# Patient Record
Sex: Female | Born: 1955
Health system: Southern US, Community
[De-identification: ages and names within clinical notes are randomized; demographics above are authoritative.]

## PROBLEM LIST (undated history)

## (undated) DIAGNOSIS — K449 Diaphragmatic hernia without obstruction or gangrene: Secondary | ICD-10-CM

## (undated) DIAGNOSIS — J309 Allergic rhinitis, unspecified: Secondary | ICD-10-CM

## (undated) DIAGNOSIS — K222 Esophageal obstruction: Secondary | ICD-10-CM

## (undated) DIAGNOSIS — IMO0002 Reserved for concepts with insufficient information to code with codable children: Secondary | ICD-10-CM

## (undated) DIAGNOSIS — K2270 Barrett's esophagus without dysplasia: Secondary | ICD-10-CM

## (undated) DIAGNOSIS — J189 Pneumonia, unspecified organism: Secondary | ICD-10-CM

## (undated) DIAGNOSIS — E785 Hyperlipidemia, unspecified: Secondary | ICD-10-CM

## (undated) DIAGNOSIS — B009 Herpesviral infection, unspecified: Secondary | ICD-10-CM

## (undated) DIAGNOSIS — I219 Acute myocardial infarction, unspecified: Secondary | ICD-10-CM

## (undated) DIAGNOSIS — Z8719 Personal history of other diseases of the digestive system: Secondary | ICD-10-CM

## (undated) DIAGNOSIS — J449 Chronic obstructive pulmonary disease, unspecified: Secondary | ICD-10-CM

## (undated) DIAGNOSIS — D649 Anemia, unspecified: Secondary | ICD-10-CM

## (undated) DIAGNOSIS — F329 Major depressive disorder, single episode, unspecified: Secondary | ICD-10-CM

## (undated) DIAGNOSIS — R079 Chest pain, unspecified: Secondary | ICD-10-CM

## (undated) DIAGNOSIS — K2980 Duodenitis without bleeding: Secondary | ICD-10-CM

## (undated) DIAGNOSIS — Z972 Presence of dental prosthetic device (complete) (partial): Secondary | ICD-10-CM

## (undated) DIAGNOSIS — F32A Depression, unspecified: Secondary | ICD-10-CM

## (undated) DIAGNOSIS — K219 Gastro-esophageal reflux disease without esophagitis: Secondary | ICD-10-CM

## (undated) DIAGNOSIS — J4489 Other specified chronic obstructive pulmonary disease: Secondary | ICD-10-CM

## (undated) DIAGNOSIS — M1990 Unspecified osteoarthritis, unspecified site: Secondary | ICD-10-CM

## (undated) DIAGNOSIS — F419 Anxiety disorder, unspecified: Secondary | ICD-10-CM

## (undated) DIAGNOSIS — J984 Other disorders of lung: Secondary | ICD-10-CM

## (undated) DIAGNOSIS — S82899A Other fracture of unspecified lower leg, initial encounter for closed fracture: Secondary | ICD-10-CM

## (undated) DIAGNOSIS — K08109 Complete loss of teeth, unspecified cause, unspecified class: Secondary | ICD-10-CM

## (undated) DIAGNOSIS — T07XXXA Unspecified multiple injuries, initial encounter: Secondary | ICD-10-CM

## (undated) DIAGNOSIS — J45909 Unspecified asthma, uncomplicated: Secondary | ICD-10-CM

## (undated) DIAGNOSIS — T7840XA Allergy, unspecified, initial encounter: Secondary | ICD-10-CM

## (undated) HISTORY — DX: Other disorders of lung: J98.4

## (undated) HISTORY — DX: Diaphragmatic hernia without obstruction or gangrene: K44.9

## (undated) HISTORY — PX: EYE SURGERY: SHX253

## (undated) HISTORY — PX: ABDOMINAL HYSTERECTOMY: SHX81

## (undated) HISTORY — DX: Duodenitis without bleeding: K29.80

## (undated) HISTORY — DX: Esophageal obstruction: K22.2

## (undated) HISTORY — DX: Unspecified multiple injuries, initial encounter: T07.XXXA

## (undated) HISTORY — DX: Allergy, unspecified, initial encounter: T78.40XA

## (undated) HISTORY — PX: APPENDECTOMY: SHX54

## (undated) HISTORY — DX: Reserved for concepts with insufficient information to code with codable children: IMO0002

## (undated) HISTORY — DX: Personal history of other diseases of the digestive system: Z87.19

## (undated) HISTORY — DX: Pneumonia, unspecified organism: J18.9

## (undated) HISTORY — PX: COLONOSCOPY: SHX174

## (undated) HISTORY — DX: Hyperlipidemia, unspecified: E78.5

## (undated) HISTORY — DX: Gastro-esophageal reflux disease without esophagitis: K21.9

## (undated) HISTORY — DX: Depression, unspecified: F32.A

## (undated) HISTORY — DX: Other specified chronic obstructive pulmonary disease: J44.89

## (undated) HISTORY — DX: Chronic obstructive pulmonary disease, unspecified: J44.9

## (undated) HISTORY — DX: Barrett's esophagus without dysplasia: K22.70

## (undated) HISTORY — PX: WRIST FRACTURE SURGERY: SHX121

## (undated) HISTORY — DX: Anxiety disorder, unspecified: F41.9

## (undated) HISTORY — DX: Chest pain, unspecified: R07.9

## (undated) HISTORY — DX: Anemia, unspecified: D64.9

## (undated) HISTORY — PX: CHOLECYSTECTOMY: SHX55

## (undated) HISTORY — DX: Unspecified osteoarthritis, unspecified site: M19.90

## (undated) HISTORY — DX: Other fracture of unspecified lower leg, initial encounter for closed fracture: S82.899A

## (undated) HISTORY — PX: NECK SURGERY: SHX720

## (undated) HISTORY — DX: Allergic rhinitis, unspecified: J30.9

## (undated) HISTORY — DX: Major depressive disorder, single episode, unspecified: F32.9

## (undated) HISTORY — PX: ELBOW FRACTURE SURGERY: SHX616

## (undated) HISTORY — PX: TONSILLECTOMY: SUR1361

---

## 1997-10-03 ENCOUNTER — Emergency Department (HOSPITAL_COMMUNITY): Admission: AD | Admit: 1997-10-03 | Discharge: 1997-10-04 | Payer: Self-pay | Admitting: Emergency Medicine

## 1998-02-17 ENCOUNTER — Ambulatory Visit (HOSPITAL_COMMUNITY): Admission: RE | Admit: 1998-02-17 | Discharge: 1998-02-17 | Payer: Self-pay | Admitting: Internal Medicine

## 1998-02-28 ENCOUNTER — Other Ambulatory Visit: Admission: RE | Admit: 1998-02-28 | Discharge: 1998-02-28 | Payer: Self-pay | Admitting: Obstetrics and Gynecology

## 1998-03-01 ENCOUNTER — Ambulatory Visit (HOSPITAL_COMMUNITY): Admission: RE | Admit: 1998-03-01 | Discharge: 1998-03-01 | Payer: Self-pay | Admitting: Obstetrics and Gynecology

## 1998-03-08 ENCOUNTER — Inpatient Hospital Stay (HOSPITAL_COMMUNITY): Admission: RE | Admit: 1998-03-08 | Discharge: 1998-03-12 | Payer: Self-pay | Admitting: Obstetrics and Gynecology

## 1998-04-19 ENCOUNTER — Emergency Department (HOSPITAL_COMMUNITY): Admission: EM | Admit: 1998-04-19 | Discharge: 1998-04-19 | Payer: Self-pay | Admitting: Internal Medicine

## 1998-11-09 ENCOUNTER — Emergency Department (HOSPITAL_COMMUNITY): Admission: EM | Admit: 1998-11-09 | Discharge: 1998-11-09 | Payer: Self-pay | Admitting: Emergency Medicine

## 1998-11-09 ENCOUNTER — Encounter: Payer: Self-pay | Admitting: Emergency Medicine

## 1999-01-03 ENCOUNTER — Emergency Department (HOSPITAL_COMMUNITY): Admission: EM | Admit: 1999-01-03 | Discharge: 1999-01-03 | Payer: Self-pay | Admitting: Emergency Medicine

## 1999-04-27 ENCOUNTER — Other Ambulatory Visit: Admission: RE | Admit: 1999-04-27 | Discharge: 1999-04-27 | Payer: Self-pay | Admitting: Obstetrics and Gynecology

## 1999-05-17 ENCOUNTER — Emergency Department (HOSPITAL_COMMUNITY): Admission: EM | Admit: 1999-05-17 | Discharge: 1999-05-17 | Payer: Self-pay | Admitting: *Deleted

## 1999-05-19 ENCOUNTER — Emergency Department (HOSPITAL_COMMUNITY): Admission: EM | Admit: 1999-05-19 | Discharge: 1999-05-19 | Payer: Self-pay | Admitting: Emergency Medicine

## 1999-08-14 ENCOUNTER — Encounter: Payer: Self-pay | Admitting: Emergency Medicine

## 1999-08-14 ENCOUNTER — Emergency Department (HOSPITAL_COMMUNITY): Admission: EM | Admit: 1999-08-14 | Discharge: 1999-08-14 | Payer: Self-pay | Admitting: Emergency Medicine

## 1999-10-10 ENCOUNTER — Emergency Department (HOSPITAL_COMMUNITY): Admission: EM | Admit: 1999-10-10 | Discharge: 1999-10-11 | Payer: Self-pay | Admitting: Emergency Medicine

## 1999-10-17 ENCOUNTER — Emergency Department (HOSPITAL_COMMUNITY): Admission: EM | Admit: 1999-10-17 | Discharge: 1999-10-17 | Payer: Self-pay | Admitting: Emergency Medicine

## 1999-11-05 ENCOUNTER — Emergency Department (HOSPITAL_COMMUNITY): Admission: EM | Admit: 1999-11-05 | Discharge: 1999-11-06 | Payer: Self-pay | Admitting: Emergency Medicine

## 1999-11-05 ENCOUNTER — Encounter: Payer: Self-pay | Admitting: Emergency Medicine

## 1999-11-19 ENCOUNTER — Encounter: Payer: Self-pay | Admitting: Orthopedic Surgery

## 1999-11-19 ENCOUNTER — Ambulatory Visit (HOSPITAL_COMMUNITY): Admission: RE | Admit: 1999-11-19 | Discharge: 1999-11-19 | Payer: Self-pay | Admitting: Orthopedic Surgery

## 2000-01-28 ENCOUNTER — Inpatient Hospital Stay (HOSPITAL_COMMUNITY): Admission: RE | Admit: 2000-01-28 | Discharge: 2000-01-29 | Payer: Self-pay | Admitting: Orthopaedic Surgery

## 2000-06-08 ENCOUNTER — Emergency Department (HOSPITAL_COMMUNITY): Admission: EM | Admit: 2000-06-08 | Discharge: 2000-06-08 | Payer: Self-pay | Admitting: *Deleted

## 2000-06-14 ENCOUNTER — Emergency Department (HOSPITAL_COMMUNITY): Admission: EM | Admit: 2000-06-14 | Discharge: 2000-06-14 | Payer: Self-pay | Admitting: Emergency Medicine

## 2000-06-14 ENCOUNTER — Encounter: Payer: Self-pay | Admitting: Emergency Medicine

## 2000-10-26 ENCOUNTER — Emergency Department (HOSPITAL_COMMUNITY): Admission: EM | Admit: 2000-10-26 | Discharge: 2000-10-26 | Payer: Self-pay | Admitting: Emergency Medicine

## 2000-10-26 ENCOUNTER — Encounter: Payer: Self-pay | Admitting: Emergency Medicine

## 2000-11-05 ENCOUNTER — Emergency Department (HOSPITAL_COMMUNITY): Admission: EM | Admit: 2000-11-05 | Discharge: 2000-11-05 | Payer: Self-pay | Admitting: Emergency Medicine

## 2000-11-05 ENCOUNTER — Encounter: Payer: Self-pay | Admitting: Emergency Medicine

## 2000-11-11 ENCOUNTER — Ambulatory Visit (HOSPITAL_COMMUNITY): Admission: RE | Admit: 2000-11-11 | Discharge: 2000-11-11 | Payer: Self-pay | Admitting: Gastroenterology

## 2001-01-06 ENCOUNTER — Encounter: Payer: Self-pay | Admitting: Gastroenterology

## 2001-01-06 ENCOUNTER — Ambulatory Visit (HOSPITAL_COMMUNITY): Admission: RE | Admit: 2001-01-06 | Discharge: 2001-01-06 | Payer: Self-pay | Admitting: Gastroenterology

## 2001-03-09 ENCOUNTER — Emergency Department (HOSPITAL_COMMUNITY): Admission: EM | Admit: 2001-03-09 | Discharge: 2001-03-09 | Payer: Self-pay | Admitting: *Deleted

## 2001-04-20 ENCOUNTER — Emergency Department (HOSPITAL_COMMUNITY): Admission: EM | Admit: 2001-04-20 | Discharge: 2001-04-20 | Payer: Self-pay | Admitting: Emergency Medicine

## 2001-04-20 ENCOUNTER — Encounter: Payer: Self-pay | Admitting: Emergency Medicine

## 2001-04-26 ENCOUNTER — Emergency Department (HOSPITAL_COMMUNITY): Admission: EM | Admit: 2001-04-26 | Discharge: 2001-04-26 | Payer: Self-pay | Admitting: Emergency Medicine

## 2001-06-24 ENCOUNTER — Encounter: Payer: Self-pay | Admitting: Emergency Medicine

## 2001-06-24 ENCOUNTER — Emergency Department (HOSPITAL_COMMUNITY): Admission: EM | Admit: 2001-06-24 | Discharge: 2001-06-24 | Payer: Self-pay | Admitting: Emergency Medicine

## 2001-07-07 ENCOUNTER — Encounter: Payer: Self-pay | Admitting: Gastroenterology

## 2001-07-07 ENCOUNTER — Ambulatory Visit (HOSPITAL_COMMUNITY): Admission: RE | Admit: 2001-07-07 | Discharge: 2001-07-07 | Payer: Self-pay | Admitting: Gastroenterology

## 2001-07-16 ENCOUNTER — Encounter: Payer: Self-pay | Admitting: Gastroenterology

## 2001-07-16 ENCOUNTER — Ambulatory Visit (HOSPITAL_COMMUNITY): Admission: RE | Admit: 2001-07-16 | Discharge: 2001-07-16 | Payer: Self-pay | Admitting: Gastroenterology

## 2001-07-28 ENCOUNTER — Encounter (INDEPENDENT_AMBULATORY_CARE_PROVIDER_SITE_OTHER): Payer: Self-pay | Admitting: Specialist

## 2001-07-28 ENCOUNTER — Ambulatory Visit (HOSPITAL_COMMUNITY): Admission: RE | Admit: 2001-07-28 | Discharge: 2001-07-28 | Payer: Self-pay | Admitting: Gastroenterology

## 2001-08-04 ENCOUNTER — Ambulatory Visit (HOSPITAL_COMMUNITY): Admission: RE | Admit: 2001-08-04 | Discharge: 2001-08-04 | Payer: Self-pay | Admitting: Gastroenterology

## 2001-08-04 ENCOUNTER — Encounter: Payer: Self-pay | Admitting: Gastroenterology

## 2001-08-06 ENCOUNTER — Emergency Department (HOSPITAL_COMMUNITY): Admission: EM | Admit: 2001-08-06 | Discharge: 2001-08-06 | Payer: Self-pay | Admitting: Emergency Medicine

## 2001-08-27 ENCOUNTER — Encounter: Admission: RE | Admit: 2001-08-27 | Discharge: 2001-08-31 | Payer: Self-pay | Admitting: Orthopaedic Surgery

## 2001-11-15 ENCOUNTER — Encounter: Payer: Self-pay | Admitting: Emergency Medicine

## 2001-11-15 ENCOUNTER — Emergency Department (HOSPITAL_COMMUNITY): Admission: EM | Admit: 2001-11-15 | Discharge: 2001-11-15 | Payer: Self-pay | Admitting: Emergency Medicine

## 2001-12-18 ENCOUNTER — Emergency Department (HOSPITAL_COMMUNITY): Admission: EM | Admit: 2001-12-18 | Discharge: 2001-12-18 | Payer: Self-pay | Admitting: *Deleted

## 2001-12-31 ENCOUNTER — Ambulatory Visit (HOSPITAL_COMMUNITY): Admission: RE | Admit: 2001-12-31 | Discharge: 2001-12-31 | Payer: Self-pay | Admitting: Orthopaedic Surgery

## 2002-01-11 ENCOUNTER — Emergency Department (HOSPITAL_COMMUNITY): Admission: EM | Admit: 2002-01-11 | Discharge: 2002-01-11 | Payer: Self-pay | Admitting: Emergency Medicine

## 2002-01-11 ENCOUNTER — Encounter: Payer: Self-pay | Admitting: Emergency Medicine

## 2002-03-24 ENCOUNTER — Emergency Department (HOSPITAL_COMMUNITY): Admission: EM | Admit: 2002-03-24 | Discharge: 2002-03-24 | Payer: Self-pay | Admitting: *Deleted

## 2002-05-15 ENCOUNTER — Encounter: Payer: Self-pay | Admitting: Emergency Medicine

## 2002-05-15 ENCOUNTER — Emergency Department (HOSPITAL_COMMUNITY): Admission: EM | Admit: 2002-05-15 | Discharge: 2002-05-15 | Payer: Self-pay | Admitting: Emergency Medicine

## 2002-05-27 ENCOUNTER — Emergency Department (HOSPITAL_COMMUNITY): Admission: EM | Admit: 2002-05-27 | Discharge: 2002-05-27 | Payer: Self-pay | Admitting: *Deleted

## 2002-05-27 ENCOUNTER — Encounter: Payer: Self-pay | Admitting: *Deleted

## 2002-08-08 ENCOUNTER — Encounter: Payer: Self-pay | Admitting: Emergency Medicine

## 2002-08-08 ENCOUNTER — Emergency Department (HOSPITAL_COMMUNITY): Admission: EM | Admit: 2002-08-08 | Discharge: 2002-08-08 | Payer: Self-pay | Admitting: Emergency Medicine

## 2002-09-08 ENCOUNTER — Encounter: Payer: Self-pay | Admitting: Emergency Medicine

## 2002-09-08 ENCOUNTER — Emergency Department (HOSPITAL_COMMUNITY): Admission: EM | Admit: 2002-09-08 | Discharge: 2002-09-08 | Payer: Self-pay | Admitting: *Deleted

## 2002-09-11 ENCOUNTER — Emergency Department (HOSPITAL_COMMUNITY): Admission: EM | Admit: 2002-09-11 | Discharge: 2002-09-11 | Payer: Self-pay | Admitting: Emergency Medicine

## 2002-10-16 ENCOUNTER — Emergency Department (HOSPITAL_COMMUNITY): Admission: EM | Admit: 2002-10-16 | Discharge: 2002-10-16 | Payer: Self-pay | Admitting: *Deleted

## 2002-11-03 ENCOUNTER — Encounter: Payer: Self-pay | Admitting: *Deleted

## 2002-11-03 ENCOUNTER — Encounter: Admission: RE | Admit: 2002-11-03 | Discharge: 2002-11-03 | Payer: Self-pay | Admitting: *Deleted

## 2002-11-22 ENCOUNTER — Ambulatory Visit (HOSPITAL_COMMUNITY): Admission: RE | Admit: 2002-11-22 | Discharge: 2002-11-22 | Payer: Self-pay | Admitting: *Deleted

## 2002-11-22 ENCOUNTER — Encounter (INDEPENDENT_AMBULATORY_CARE_PROVIDER_SITE_OTHER): Payer: Self-pay

## 2002-11-25 ENCOUNTER — Inpatient Hospital Stay: Admission: AD | Admit: 2002-11-25 | Discharge: 2002-11-25 | Payer: Self-pay | Admitting: *Deleted

## 2002-11-27 ENCOUNTER — Encounter: Payer: Self-pay | Admitting: Emergency Medicine

## 2002-11-27 ENCOUNTER — Emergency Department (HOSPITAL_COMMUNITY): Admission: EM | Admit: 2002-11-27 | Discharge: 2002-11-27 | Payer: Self-pay | Admitting: Emergency Medicine

## 2002-12-26 ENCOUNTER — Emergency Department (HOSPITAL_COMMUNITY): Admission: EM | Admit: 2002-12-26 | Discharge: 2002-12-26 | Payer: Self-pay | Admitting: Emergency Medicine

## 2002-12-26 ENCOUNTER — Encounter: Payer: Self-pay | Admitting: Emergency Medicine

## 2003-01-24 ENCOUNTER — Ambulatory Visit (HOSPITAL_BASED_OUTPATIENT_CLINIC_OR_DEPARTMENT_OTHER): Admission: RE | Admit: 2003-01-24 | Discharge: 2003-01-24 | Payer: Self-pay | Admitting: Orthopaedic Surgery

## 2003-01-28 ENCOUNTER — Ambulatory Visit (HOSPITAL_BASED_OUTPATIENT_CLINIC_OR_DEPARTMENT_OTHER): Admission: RE | Admit: 2003-01-28 | Discharge: 2003-01-28 | Payer: Self-pay | Admitting: Orthopaedic Surgery

## 2003-02-05 ENCOUNTER — Encounter: Payer: Self-pay | Admitting: Emergency Medicine

## 2003-02-05 ENCOUNTER — Emergency Department (HOSPITAL_COMMUNITY): Admission: EM | Admit: 2003-02-05 | Discharge: 2003-02-05 | Payer: Self-pay | Admitting: Emergency Medicine

## 2003-03-31 ENCOUNTER — Emergency Department (HOSPITAL_COMMUNITY): Admission: EM | Admit: 2003-03-31 | Discharge: 2003-03-31 | Payer: Self-pay | Admitting: Emergency Medicine

## 2003-04-01 ENCOUNTER — Encounter: Payer: Self-pay | Admitting: Internal Medicine

## 2003-04-01 ENCOUNTER — Encounter: Admission: RE | Admit: 2003-04-01 | Discharge: 2003-04-01 | Payer: Self-pay | Admitting: Internal Medicine

## 2003-05-09 ENCOUNTER — Emergency Department (HOSPITAL_COMMUNITY): Admission: EM | Admit: 2003-05-09 | Discharge: 2003-05-09 | Payer: Self-pay

## 2003-05-22 ENCOUNTER — Emergency Department (HOSPITAL_COMMUNITY): Admission: EM | Admit: 2003-05-22 | Discharge: 2003-05-22 | Payer: Self-pay | Admitting: Emergency Medicine

## 2003-05-22 ENCOUNTER — Encounter: Payer: Self-pay | Admitting: Emergency Medicine

## 2003-06-06 ENCOUNTER — Inpatient Hospital Stay (HOSPITAL_COMMUNITY): Admission: RE | Admit: 2003-06-06 | Discharge: 2003-06-07 | Payer: Self-pay | Admitting: Orthopaedic Surgery

## 2003-10-19 ENCOUNTER — Emergency Department (HOSPITAL_COMMUNITY): Admission: EM | Admit: 2003-10-19 | Discharge: 2003-10-19 | Payer: Self-pay | Admitting: Emergency Medicine

## 2003-10-26 ENCOUNTER — Emergency Department (HOSPITAL_COMMUNITY): Admission: EM | Admit: 2003-10-26 | Discharge: 2003-10-26 | Payer: Self-pay | Admitting: Emergency Medicine

## 2003-11-28 ENCOUNTER — Emergency Department (HOSPITAL_COMMUNITY): Admission: EM | Admit: 2003-11-28 | Discharge: 2003-11-29 | Payer: Self-pay | Admitting: Emergency Medicine

## 2003-11-30 ENCOUNTER — Emergency Department (HOSPITAL_COMMUNITY): Admission: EM | Admit: 2003-11-30 | Discharge: 2003-11-30 | Payer: Self-pay | Admitting: Emergency Medicine

## 2003-12-04 ENCOUNTER — Ambulatory Visit (HOSPITAL_COMMUNITY): Admission: RE | Admit: 2003-12-04 | Discharge: 2003-12-04 | Payer: Self-pay | Admitting: Emergency Medicine

## 2003-12-19 ENCOUNTER — Emergency Department (HOSPITAL_COMMUNITY): Admission: EM | Admit: 2003-12-19 | Discharge: 2003-12-19 | Payer: Self-pay | Admitting: Emergency Medicine

## 2004-01-03 ENCOUNTER — Emergency Department (HOSPITAL_COMMUNITY): Admission: EM | Admit: 2004-01-03 | Discharge: 2004-01-04 | Payer: Self-pay | Admitting: *Deleted

## 2004-02-15 ENCOUNTER — Emergency Department (HOSPITAL_COMMUNITY): Admission: EM | Admit: 2004-02-15 | Discharge: 2004-02-16 | Payer: Self-pay | Admitting: Emergency Medicine

## 2004-02-29 ENCOUNTER — Emergency Department (HOSPITAL_COMMUNITY): Admission: EM | Admit: 2004-02-29 | Discharge: 2004-02-29 | Payer: Self-pay | Admitting: Emergency Medicine

## 2004-04-05 ENCOUNTER — Other Ambulatory Visit: Admission: RE | Admit: 2004-04-05 | Discharge: 2004-04-05 | Payer: Self-pay | Admitting: Family Medicine

## 2004-04-06 ENCOUNTER — Emergency Department (HOSPITAL_COMMUNITY): Admission: EM | Admit: 2004-04-06 | Discharge: 2004-04-06 | Payer: Self-pay | Admitting: Emergency Medicine

## 2004-09-15 ENCOUNTER — Emergency Department (HOSPITAL_COMMUNITY): Admission: EM | Admit: 2004-09-15 | Discharge: 2004-09-16 | Payer: Self-pay | Admitting: Emergency Medicine

## 2004-09-24 ENCOUNTER — Emergency Department (HOSPITAL_COMMUNITY): Admission: EM | Admit: 2004-09-24 | Discharge: 2004-09-25 | Payer: Self-pay | Admitting: Emergency Medicine

## 2004-10-19 ENCOUNTER — Ambulatory Visit: Payer: Self-pay | Admitting: Gastroenterology

## 2004-10-29 ENCOUNTER — Ambulatory Visit: Payer: Self-pay | Admitting: Gastroenterology

## 2004-12-13 ENCOUNTER — Emergency Department (HOSPITAL_COMMUNITY): Admission: EM | Admit: 2004-12-13 | Discharge: 2004-12-14 | Payer: Self-pay | Admitting: Emergency Medicine

## 2004-12-25 ENCOUNTER — Ambulatory Visit: Payer: Self-pay | Admitting: Gastroenterology

## 2005-01-15 ENCOUNTER — Emergency Department (HOSPITAL_COMMUNITY): Admission: EM | Admit: 2005-01-15 | Discharge: 2005-01-15 | Payer: Self-pay | Admitting: Emergency Medicine

## 2005-01-18 ENCOUNTER — Ambulatory Visit: Payer: Self-pay | Admitting: Family Medicine

## 2005-01-24 ENCOUNTER — Ambulatory Visit: Payer: Self-pay | Admitting: Pulmonary Disease

## 2005-01-24 ENCOUNTER — Ambulatory Visit: Payer: Self-pay | Admitting: Family Medicine

## 2005-02-10 ENCOUNTER — Emergency Department (HOSPITAL_COMMUNITY): Admission: EM | Admit: 2005-02-10 | Discharge: 2005-02-10 | Payer: Self-pay | Admitting: Emergency Medicine

## 2005-02-11 ENCOUNTER — Emergency Department (HOSPITAL_COMMUNITY): Admission: EM | Admit: 2005-02-11 | Discharge: 2005-02-11 | Payer: Self-pay | Admitting: Emergency Medicine

## 2005-02-26 ENCOUNTER — Ambulatory Visit: Payer: Self-pay | Admitting: Pulmonary Disease

## 2005-03-14 ENCOUNTER — Emergency Department (HOSPITAL_COMMUNITY): Admission: EM | Admit: 2005-03-14 | Discharge: 2005-03-14 | Payer: Self-pay | Admitting: Emergency Medicine

## 2005-04-26 ENCOUNTER — Emergency Department (HOSPITAL_COMMUNITY): Admission: EM | Admit: 2005-04-26 | Discharge: 2005-04-27 | Payer: Self-pay | Admitting: Emergency Medicine

## 2005-05-04 ENCOUNTER — Emergency Department (HOSPITAL_COMMUNITY): Admission: EM | Admit: 2005-05-04 | Discharge: 2005-05-04 | Payer: Self-pay | Admitting: Emergency Medicine

## 2005-05-14 ENCOUNTER — Ambulatory Visit: Payer: Self-pay | Admitting: Family Medicine

## 2005-05-16 ENCOUNTER — Ambulatory Visit: Payer: Self-pay | Admitting: Gastroenterology

## 2005-05-25 IMAGING — CT CT HEAD W/O CM
1 series · 16 of 28 positions shown, 20 images · IV contrast (agent unspecified)
Comparison: none

FINDINGS
CLINICAL DATA: HEADACHES
CT OF HEAD WITHOUT CONTRAST:
ROUTINE NON-CONTRAST HEAD CT WAS PERFORMED.
THERE IS NO EVIDENCE OF INTRACRANIAL HEMORRHAGE, BRAIN EDEMA, OR MASS EFFECT.  THE VENTRICLES ARE
NORMAL.  NO EXTRA-AXIAL ABNORMALITIES ARE IDENTIFIED.  BONE WINDOWS SHOW NO SIGNIFICANT
ABNORMALITIES.
IMPRESSION
NEGATIVE NONCONTRAST HEAD CT.

[Series 2: brain · axial · 0.49mm/px · z∈[+45,+177]mm · 16 of 28 slices shown, 20 images]
[im 2/28  brain]
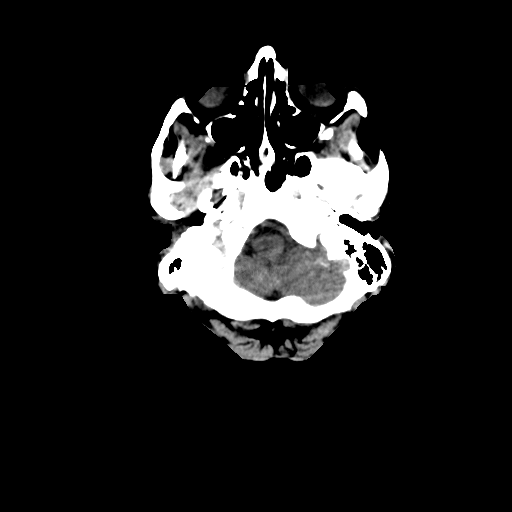
[im 2/28  bone]
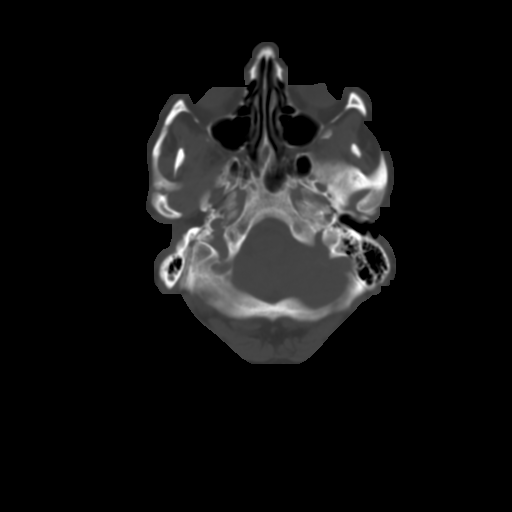
[im 4/28  brain]
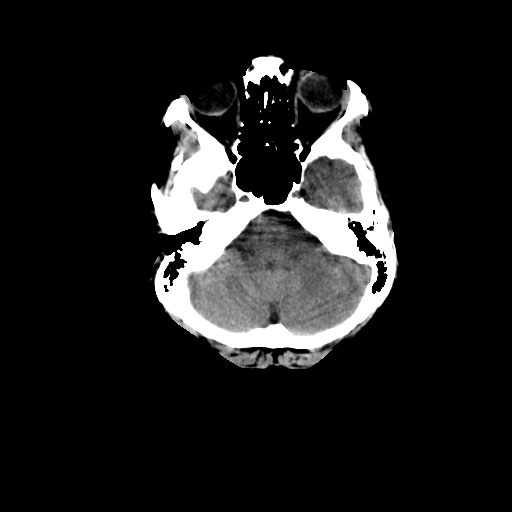
[im 6/28  brain]
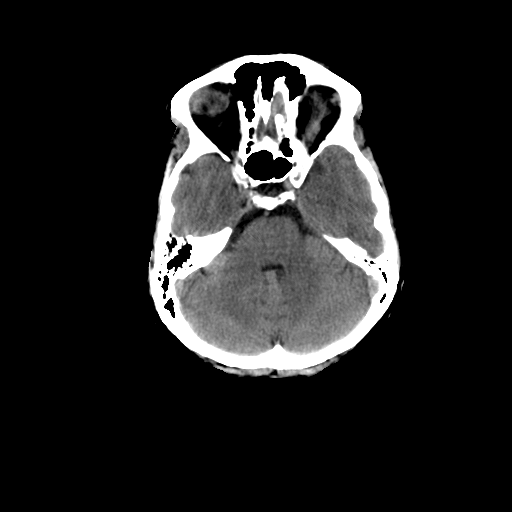
[im 7/28  brain]
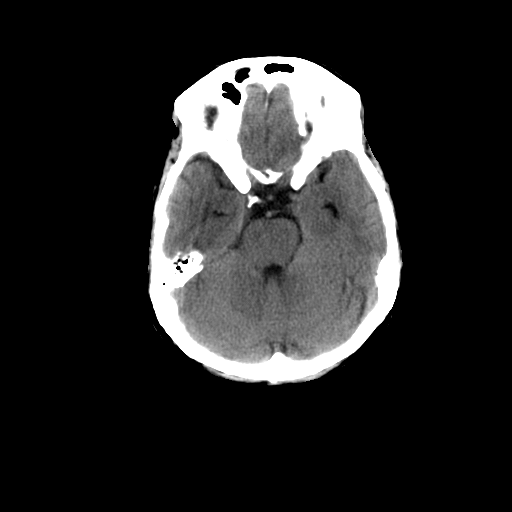
[im 9/28  brain]
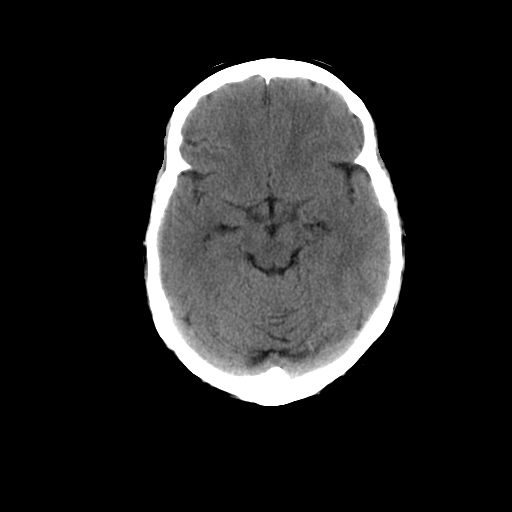
[im 9/28  bone]
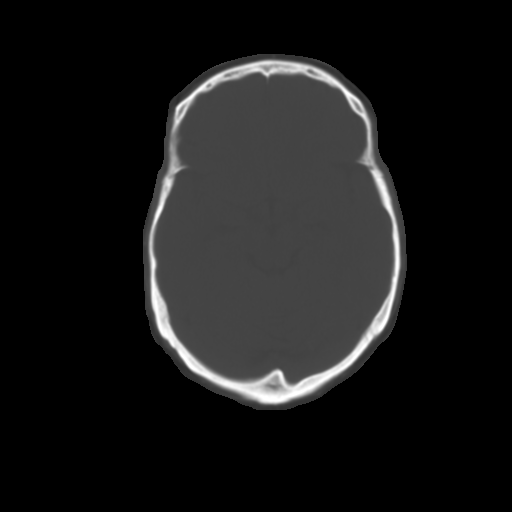
[im 10/28  brain]
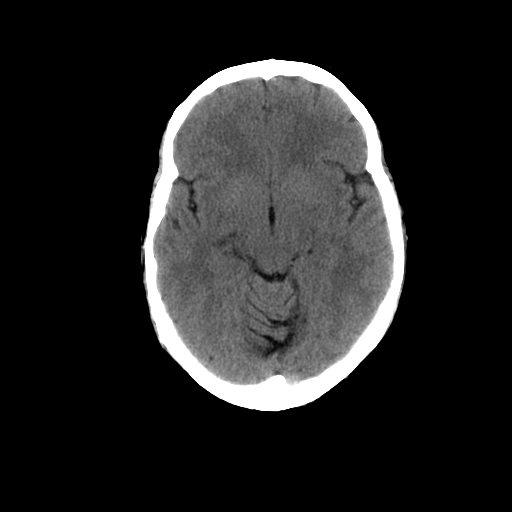
[im 12/28  brain]
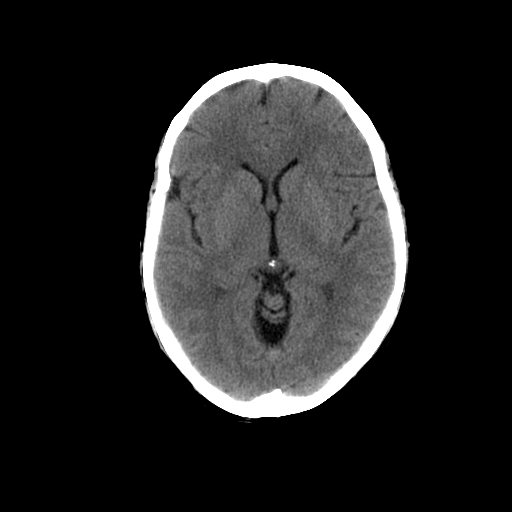
[im 14/28  brain]
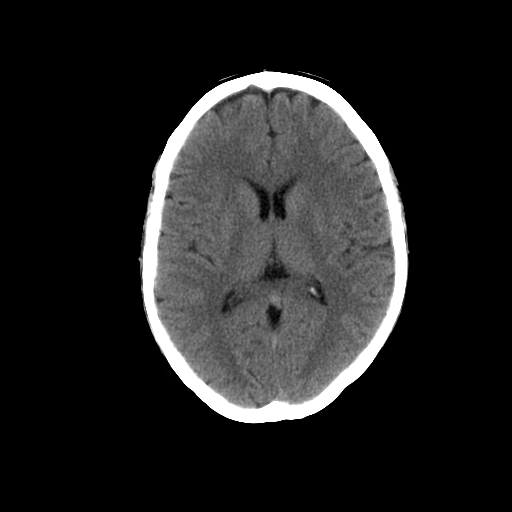
[im 15/28  brain]
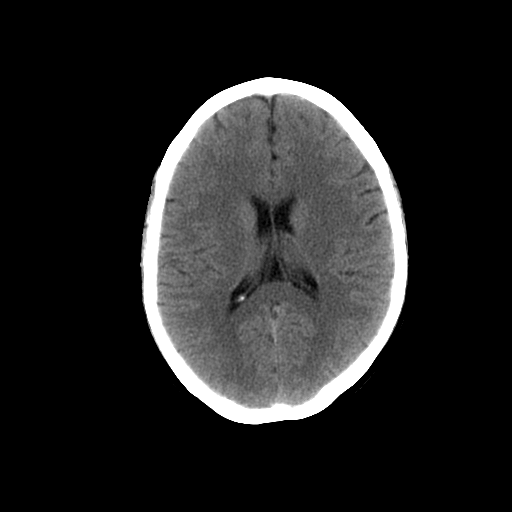
[im 15/28  bone]
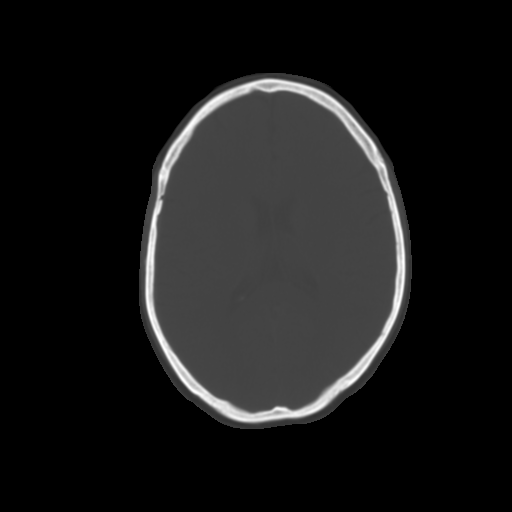
[im 17/28  brain]
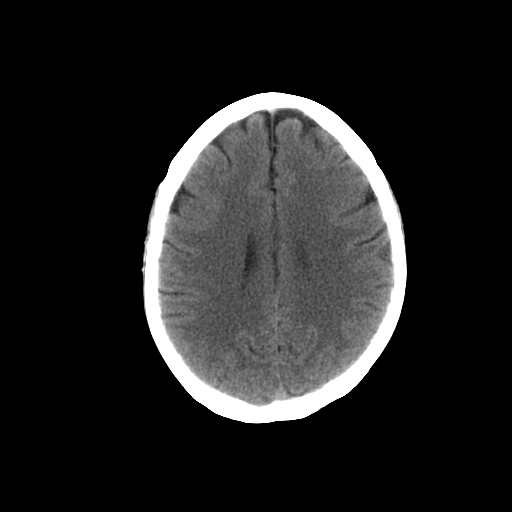
[im 19/28  brain]
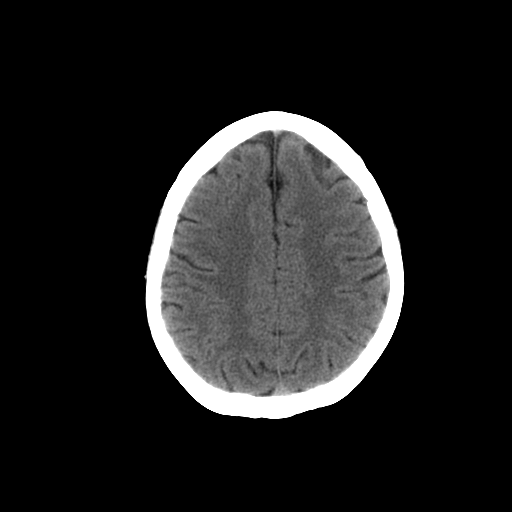
[im 20/28  brain]
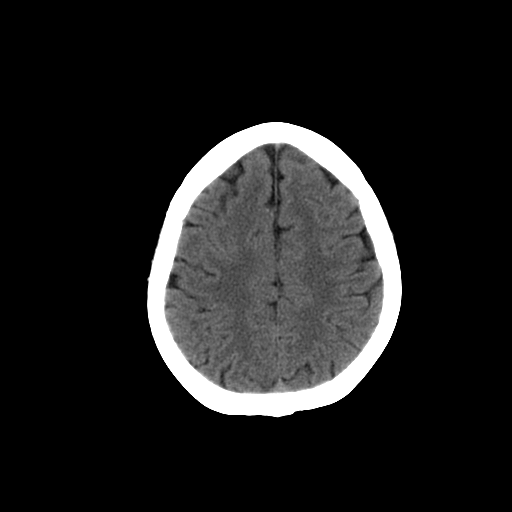
[im 22/28  brain]
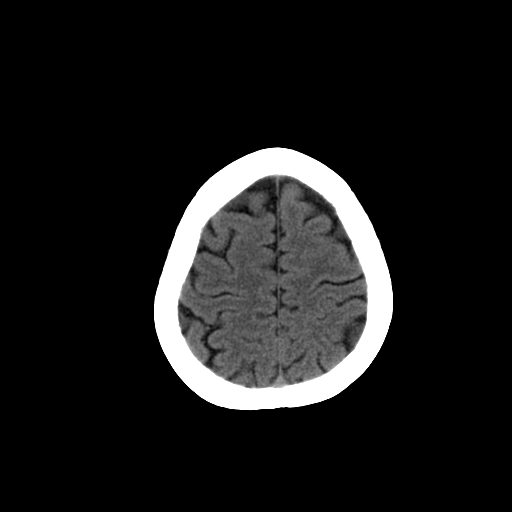
[im 22/28  bone]
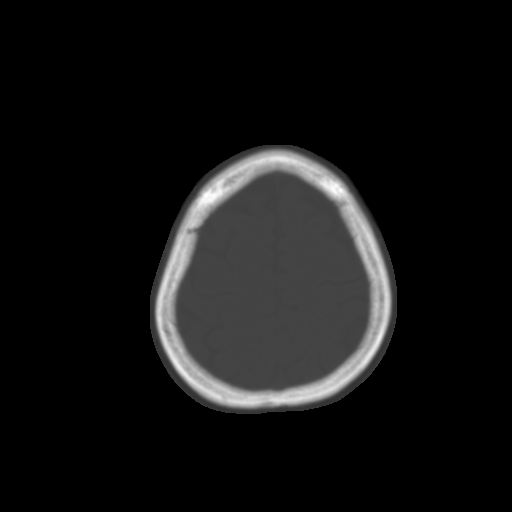
[im 23/28  brain]
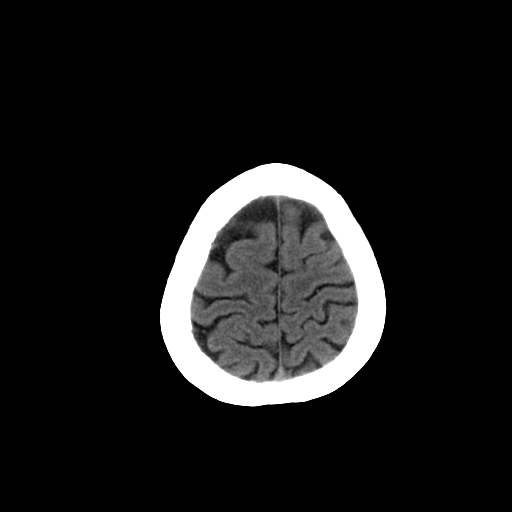
[im 25/28  brain]
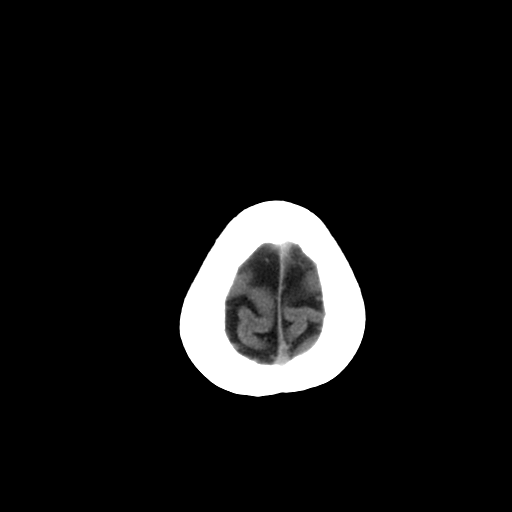
[im 27/28  brain]
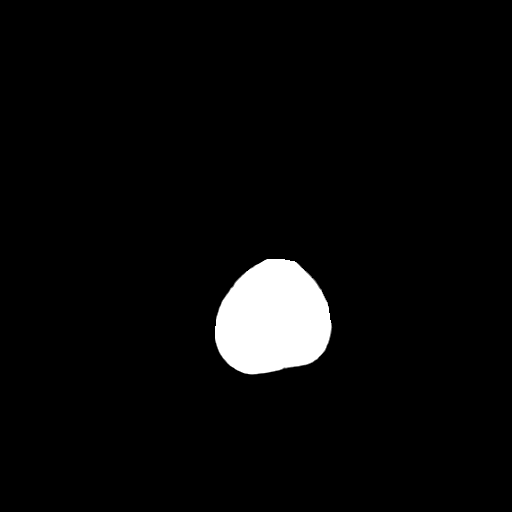

[16 of 28 positions shown; findings below may reference images not displayed]

## 2005-06-02 ENCOUNTER — Emergency Department (HOSPITAL_COMMUNITY): Admission: EM | Admit: 2005-06-02 | Discharge: 2005-06-02 | Payer: Self-pay | Admitting: Emergency Medicine

## 2005-06-04 ENCOUNTER — Encounter: Admission: RE | Admit: 2005-06-04 | Discharge: 2005-06-04 | Payer: Self-pay | Admitting: Family Medicine

## 2005-06-04 ENCOUNTER — Ambulatory Visit: Payer: Self-pay | Admitting: Family Medicine

## 2005-07-10 ENCOUNTER — Emergency Department (HOSPITAL_COMMUNITY): Admission: EM | Admit: 2005-07-10 | Discharge: 2005-07-10 | Payer: Self-pay | Admitting: Emergency Medicine

## 2005-07-25 ENCOUNTER — Ambulatory Visit: Payer: Self-pay | Admitting: Family Medicine

## 2005-07-29 ENCOUNTER — Encounter: Admission: RE | Admit: 2005-07-29 | Discharge: 2005-07-29 | Payer: Self-pay | Admitting: Family Medicine

## 2005-07-29 ENCOUNTER — Ambulatory Visit: Payer: Self-pay | Admitting: Family Medicine

## 2005-08-09 ENCOUNTER — Emergency Department (HOSPITAL_COMMUNITY): Admission: EM | Admit: 2005-08-09 | Discharge: 2005-08-10 | Payer: Self-pay | Admitting: Emergency Medicine

## 2005-08-09 ENCOUNTER — Encounter: Admission: RE | Admit: 2005-08-09 | Discharge: 2005-08-09 | Payer: Self-pay | Admitting: Family Medicine

## 2005-08-12 ENCOUNTER — Ambulatory Visit: Payer: Self-pay | Admitting: Family Medicine

## 2005-08-13 ENCOUNTER — Ambulatory Visit: Payer: Self-pay

## 2005-08-28 ENCOUNTER — Emergency Department (HOSPITAL_COMMUNITY): Admission: EM | Admit: 2005-08-28 | Discharge: 2005-08-28 | Payer: Self-pay | Admitting: Emergency Medicine

## 2005-09-16 DIAGNOSIS — K219 Gastro-esophageal reflux disease without esophagitis: Secondary | ICD-10-CM

## 2005-09-16 DIAGNOSIS — K2980 Duodenitis without bleeding: Secondary | ICD-10-CM

## 2005-09-16 DIAGNOSIS — K2270 Barrett's esophagus without dysplasia: Secondary | ICD-10-CM

## 2005-09-16 HISTORY — DX: Gastro-esophageal reflux disease without esophagitis: K21.9

## 2005-09-16 HISTORY — DX: Barrett's esophagus without dysplasia: K22.70

## 2005-09-16 HISTORY — DX: Duodenitis without bleeding: K29.80

## 2005-09-20 ENCOUNTER — Emergency Department (HOSPITAL_COMMUNITY): Admission: EM | Admit: 2005-09-20 | Discharge: 2005-09-21 | Payer: Self-pay | Admitting: Emergency Medicine

## 2005-10-07 ENCOUNTER — Ambulatory Visit: Payer: Self-pay | Admitting: Family Medicine

## 2005-10-15 ENCOUNTER — Ambulatory Visit: Payer: Self-pay | Admitting: Family Medicine

## 2005-10-18 ENCOUNTER — Ambulatory Visit: Payer: Self-pay | Admitting: Pulmonary Disease

## 2005-11-07 ENCOUNTER — Ambulatory Visit: Payer: Self-pay | Admitting: Gastroenterology

## 2005-11-13 ENCOUNTER — Ambulatory Visit: Payer: Self-pay | Admitting: Gastroenterology

## 2005-11-13 ENCOUNTER — Encounter (INDEPENDENT_AMBULATORY_CARE_PROVIDER_SITE_OTHER): Payer: Self-pay | Admitting: Specialist

## 2005-11-20 ENCOUNTER — Emergency Department (HOSPITAL_COMMUNITY): Admission: EM | Admit: 2005-11-20 | Discharge: 2005-11-20 | Payer: Self-pay | Admitting: Emergency Medicine

## 2005-12-01 ENCOUNTER — Emergency Department (HOSPITAL_COMMUNITY): Admission: EM | Admit: 2005-12-01 | Discharge: 2005-12-01 | Payer: Self-pay | Admitting: Emergency Medicine

## 2005-12-03 ENCOUNTER — Ambulatory Visit: Payer: Self-pay | Admitting: Gastroenterology

## 2005-12-04 ENCOUNTER — Ambulatory Visit: Payer: Self-pay | Admitting: Gastroenterology

## 2005-12-04 ENCOUNTER — Ambulatory Visit (HOSPITAL_COMMUNITY): Admission: RE | Admit: 2005-12-04 | Discharge: 2005-12-04 | Payer: Self-pay | Admitting: Gastroenterology

## 2005-12-11 ENCOUNTER — Ambulatory Visit: Payer: Self-pay | Admitting: Gastroenterology

## 2005-12-12 IMAGING — CR DG CHEST 2V
2 series · 2 of 2 positions shown · non-contrast
Comparison: 02/05/03.

CLINICAL DATA: Chest wall pain.  Smoker.
 CHEST -  2 VIEWS

[view not recorded (1 of 2)]
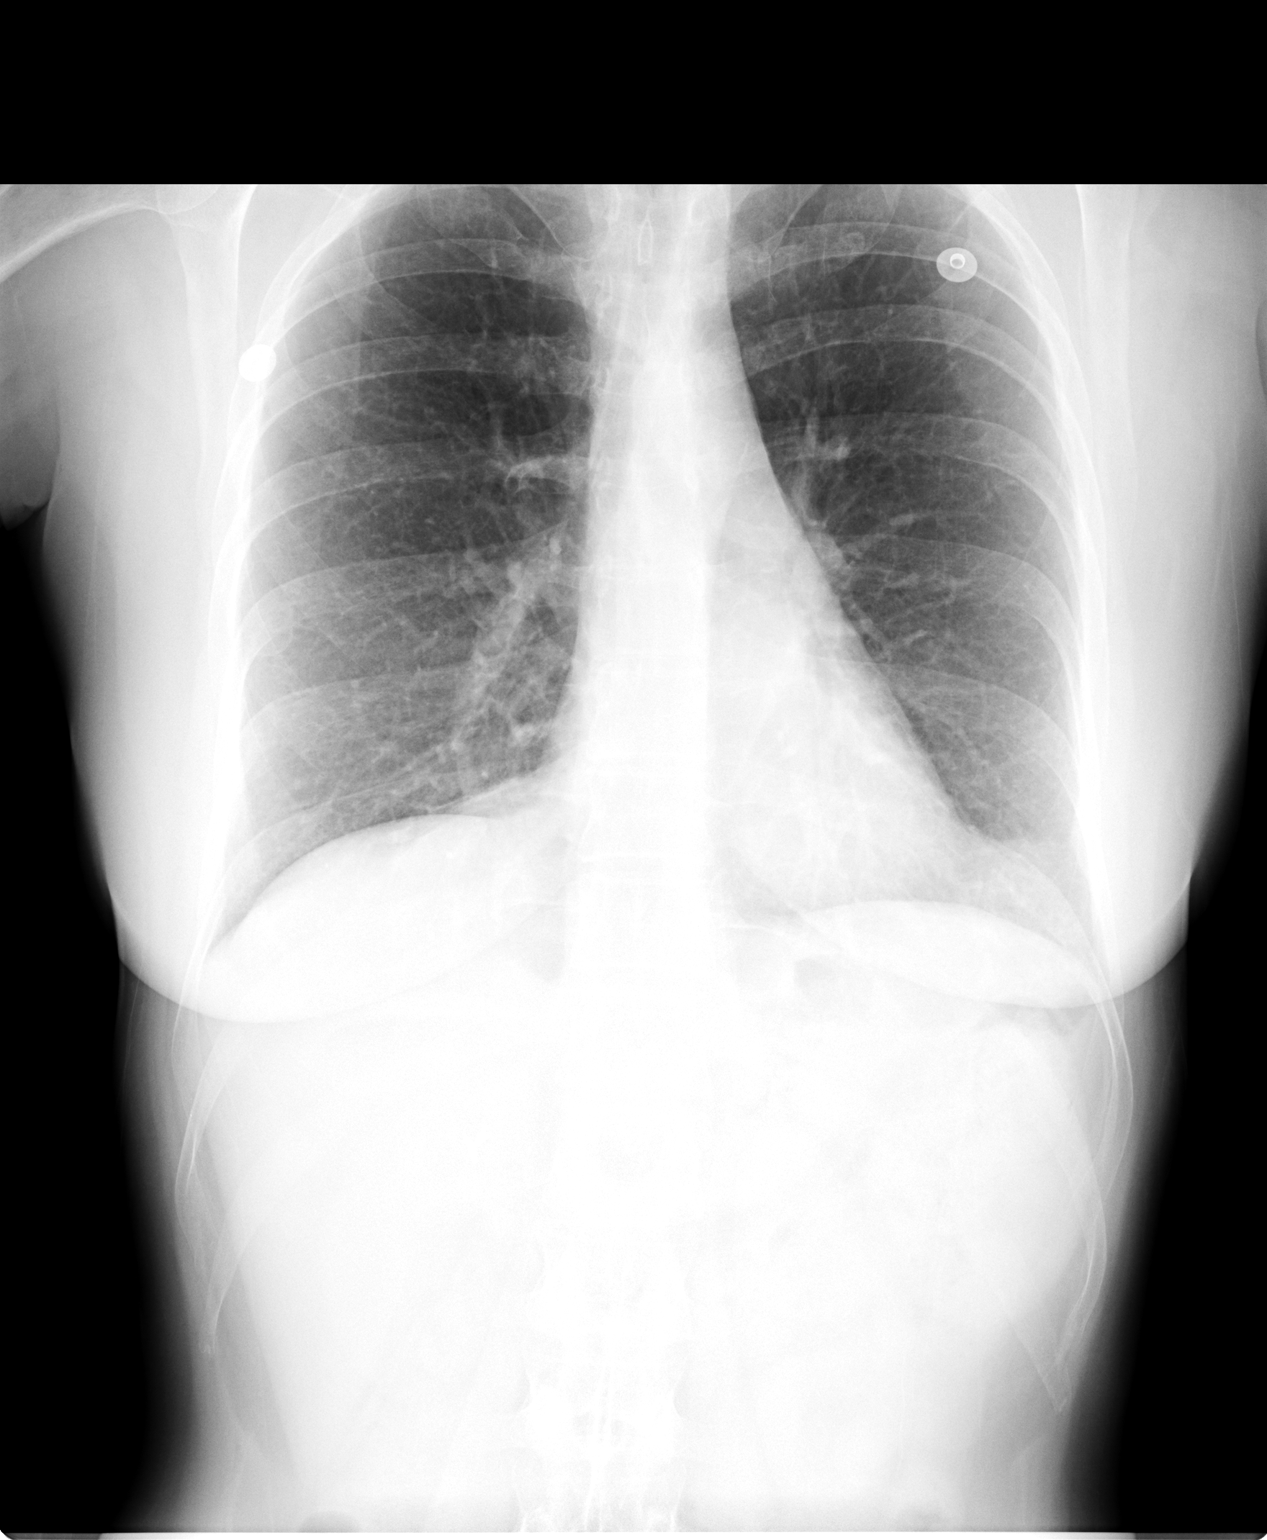

[view not recorded (2 of 2)]
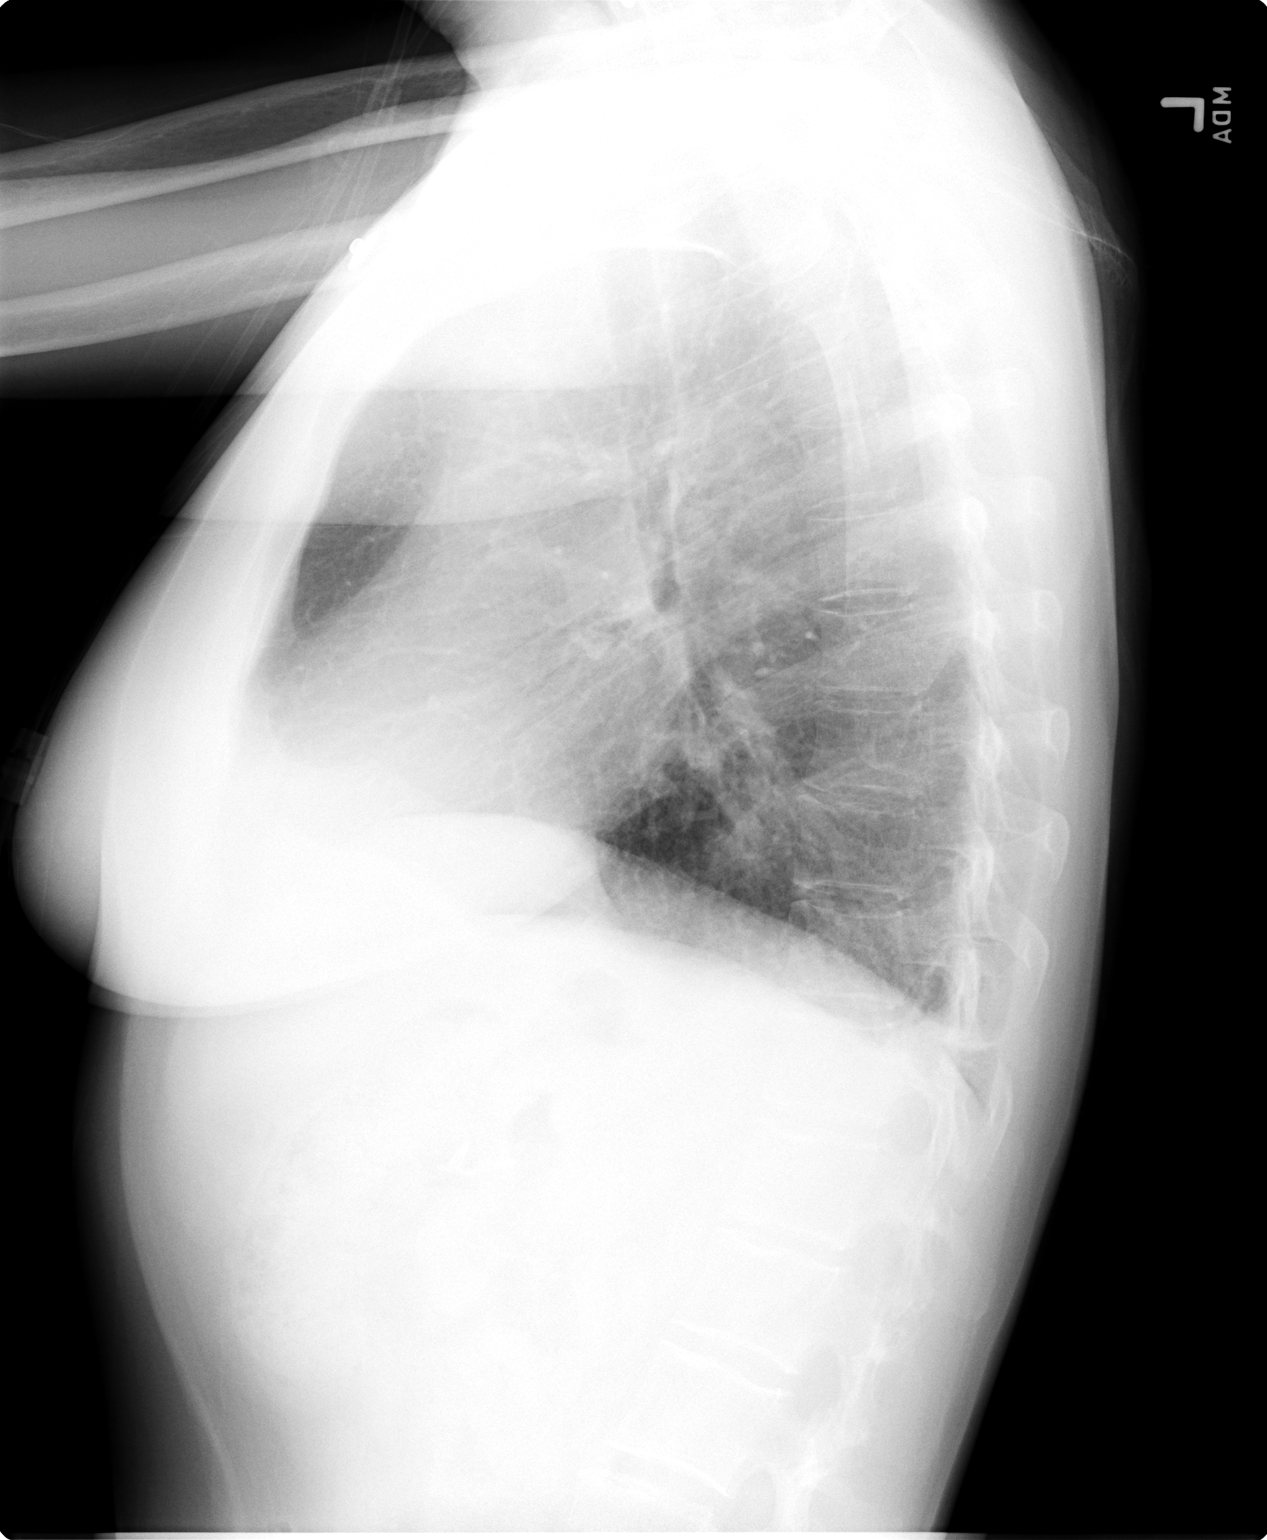

[2 of 2 positions shown; findings below may reference images not displayed]

Findings compatible with COPD and chronic bronchitic changes.  Normal heart size.  Metallic clips in the right upper quadrant are likely due to cholecystectomy.
 IMPRESSION
 COPD.  See comments above.  No acute chest findings.

## 2005-12-17 ENCOUNTER — Ambulatory Visit: Payer: Self-pay | Admitting: Gastroenterology

## 2005-12-19 IMAGING — CR DG CHEST 2V
2 series · 2 of 2 positions shown · non-contrast
Comparison: two view chest 10/19/03.

CLINICAL DATA: dizziness, chest pain, shortness of breath
TWO VIEW CHEST (10/26/03 @ [DATE] hours):

[view not recorded (1 of 2)]
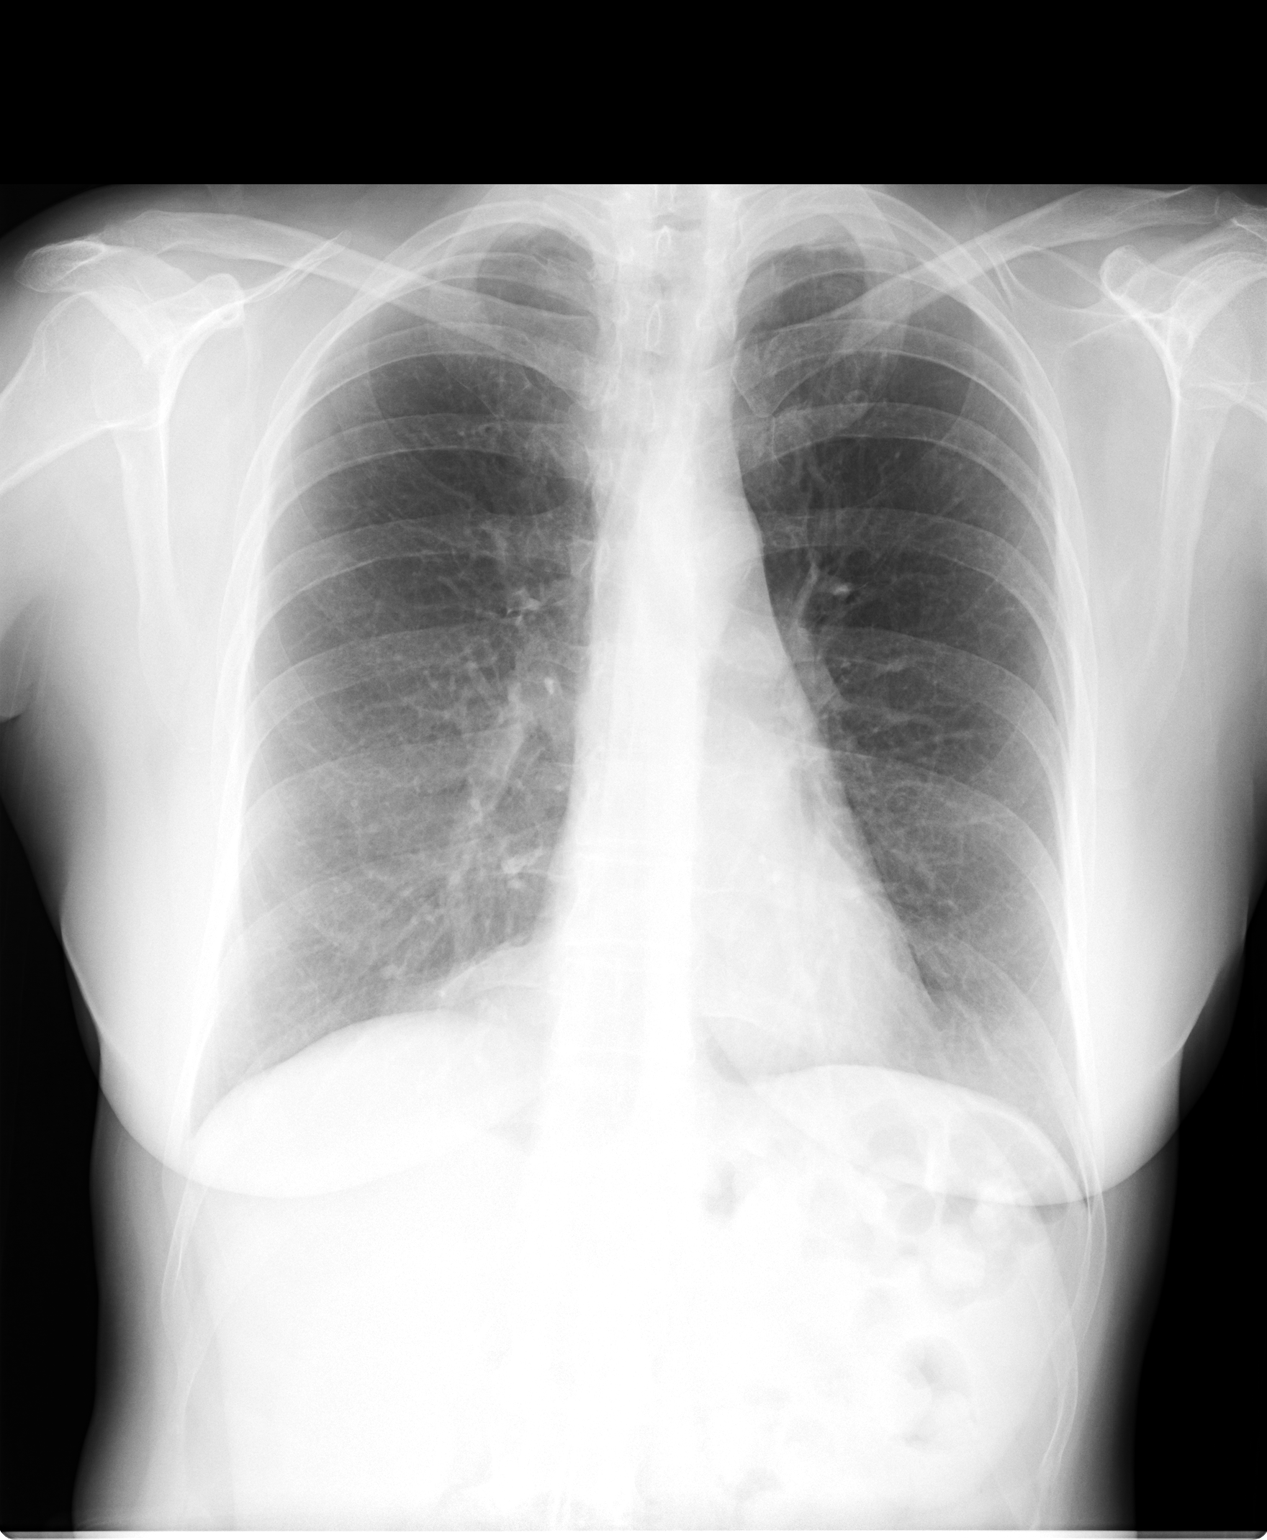

[view not recorded (2 of 2)]
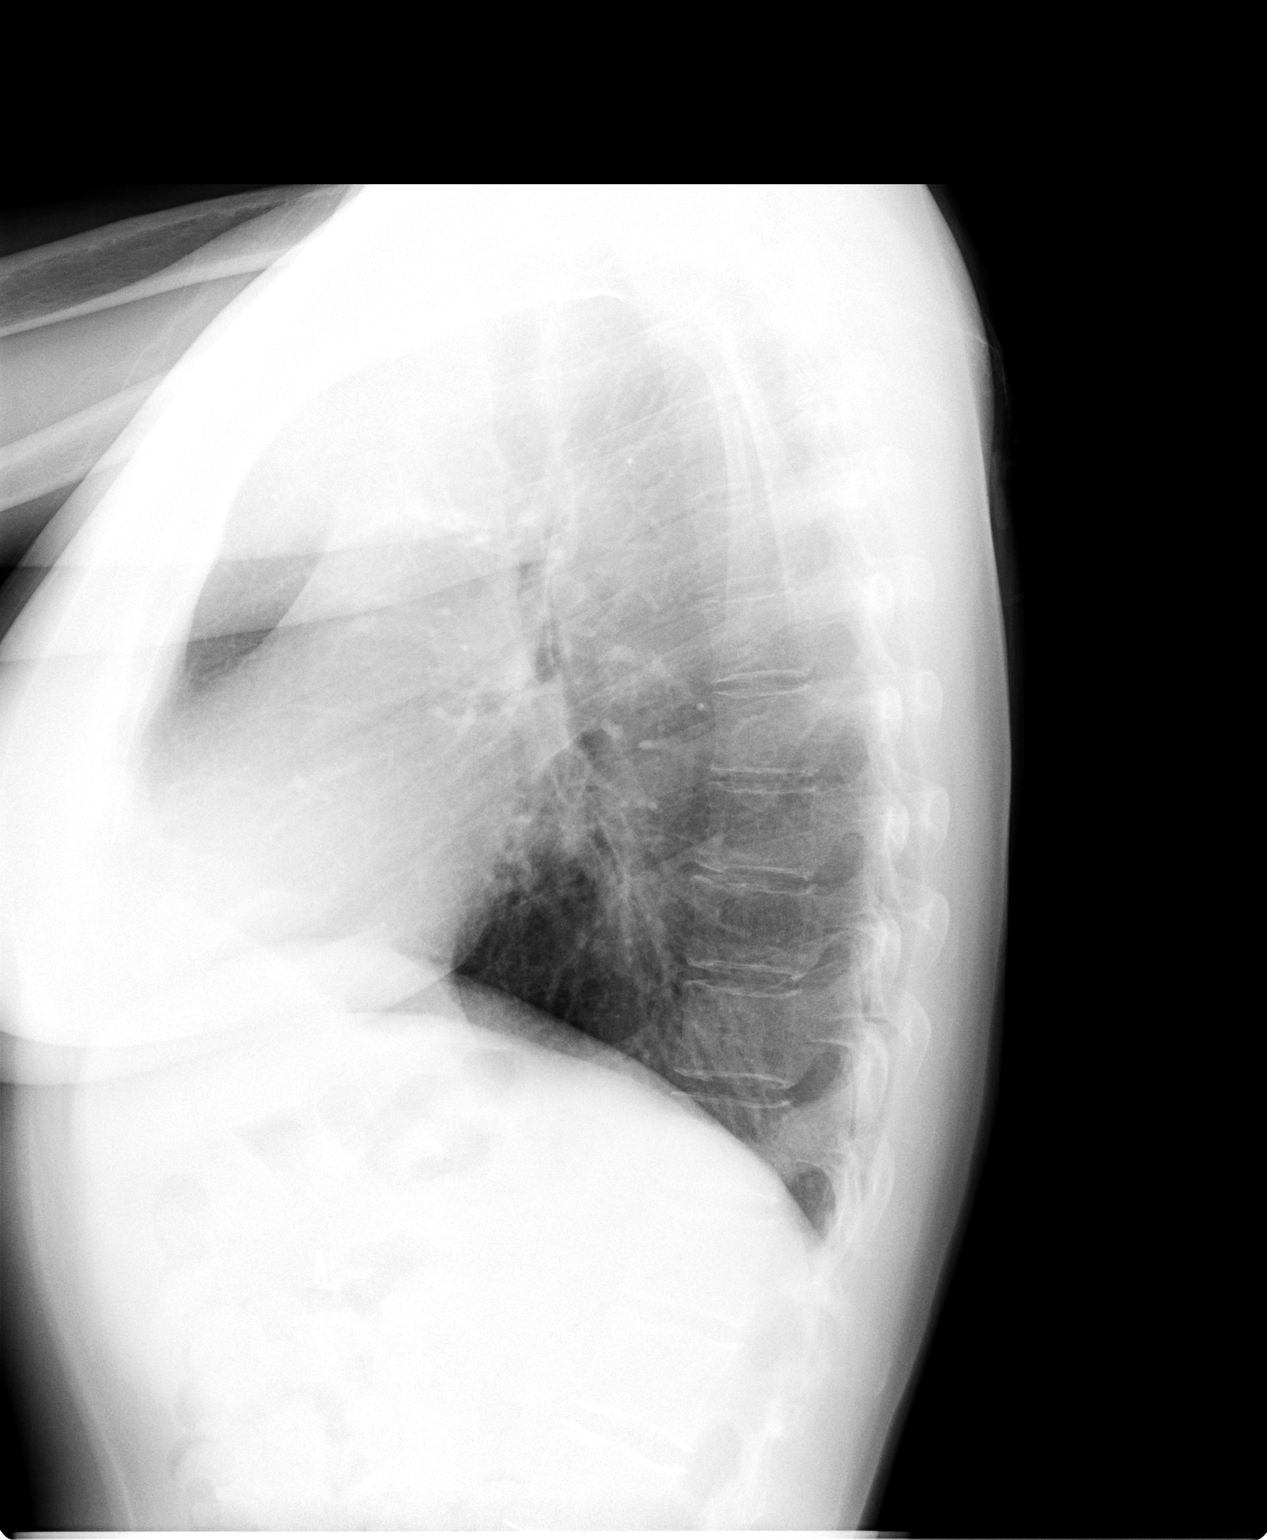

[2 of 2 positions shown; findings below may reference images not displayed]

The heart size is normal and is stable.  Minimal thoracic aortic atherosclerosis is present.  The hilar and mediastinal contours are otherwise unremarkable.  Prominent bronchovascular markings diffusely with central peribronchial thickening is unchanged and consistent with chronic bronchitis.  The lungs appear clear otherwise.  The visualized bony thorax appears intact.
IMPRESSION
COPD.  No evidence of acute disease.

## 2005-12-25 ENCOUNTER — Ambulatory Visit: Payer: Self-pay | Admitting: Gastroenterology

## 2005-12-27 ENCOUNTER — Ambulatory Visit: Payer: Self-pay | Admitting: Gastroenterology

## 2006-01-04 ENCOUNTER — Emergency Department (HOSPITAL_COMMUNITY): Admission: EM | Admit: 2006-01-04 | Discharge: 2006-01-05 | Payer: Self-pay | Admitting: Emergency Medicine

## 2006-02-11 IMAGING — CR DG CHEST 2V
2 series · 2 of 2 positions shown · non-contrast
Comparison: none

CLINICAL DATA: Chest pain.  Emphysema.  

 PA AND LATERAL CHEST, 12/19/03
 Comparison 10/26/03.

[view not recorded (1 of 2)]
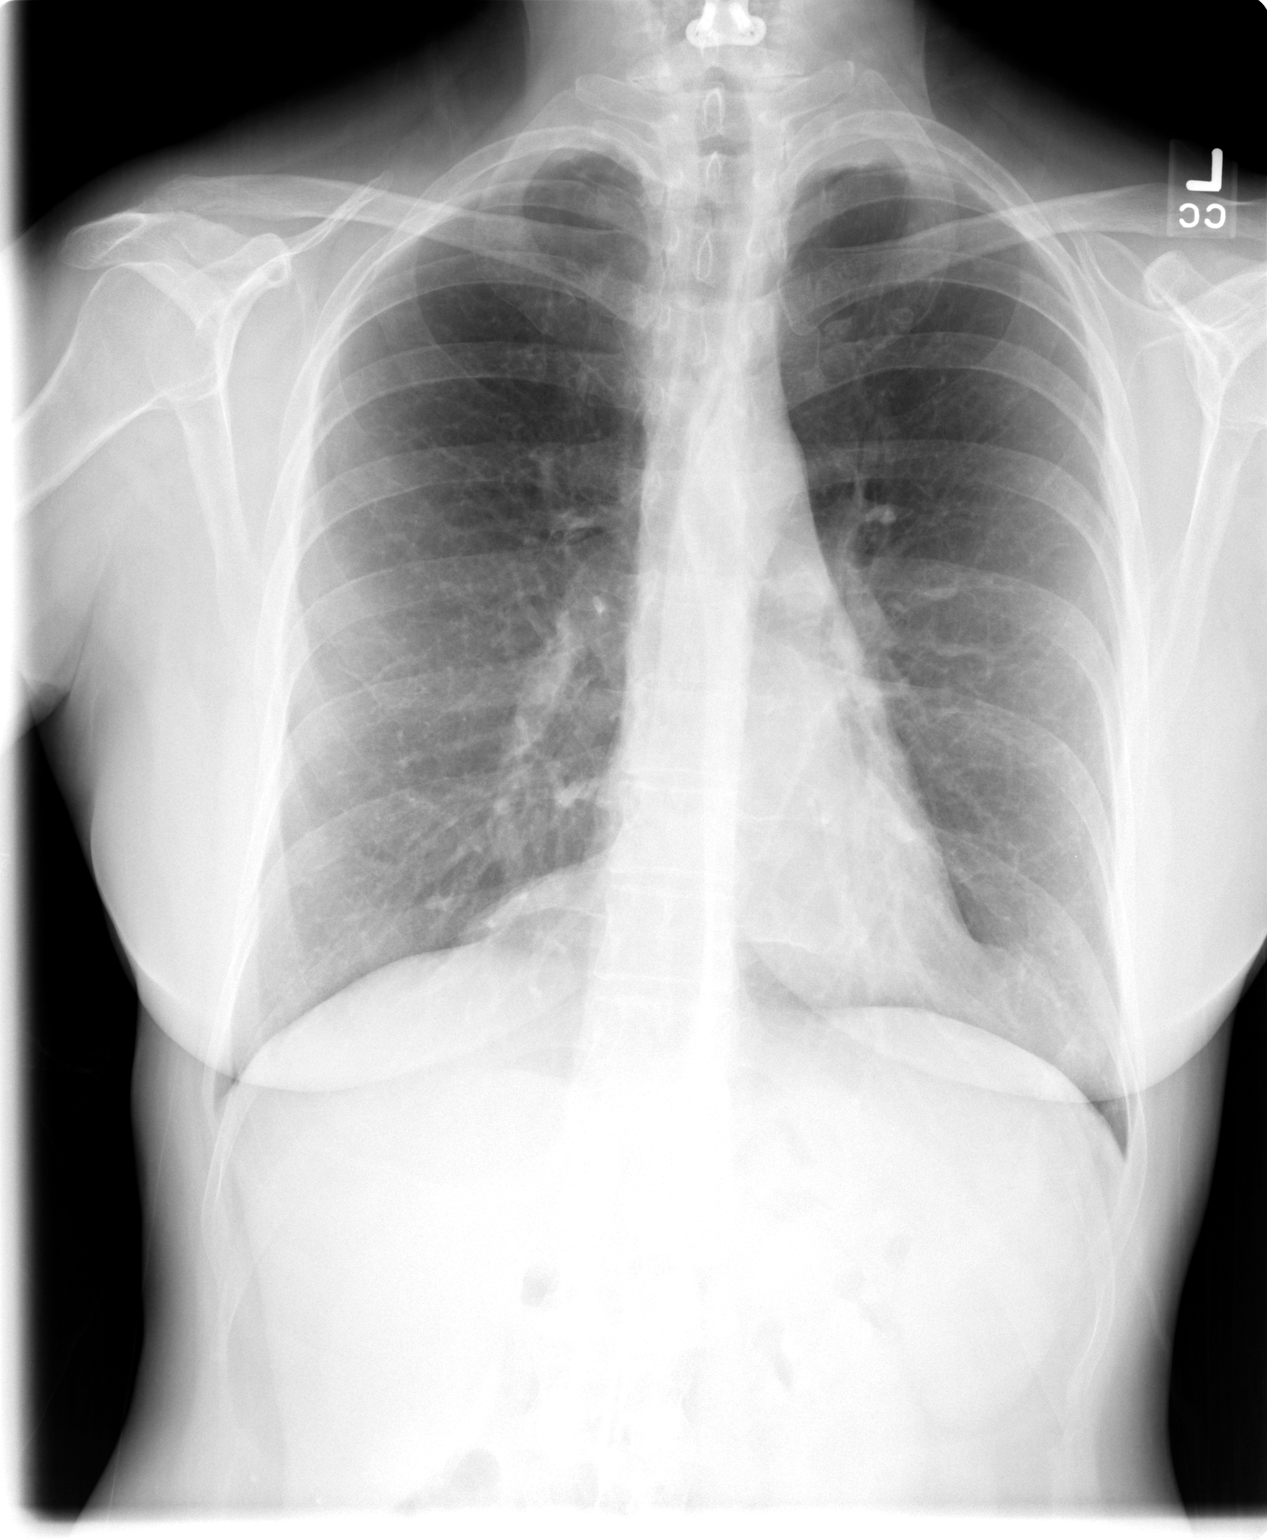

[view not recorded (2 of 2)]
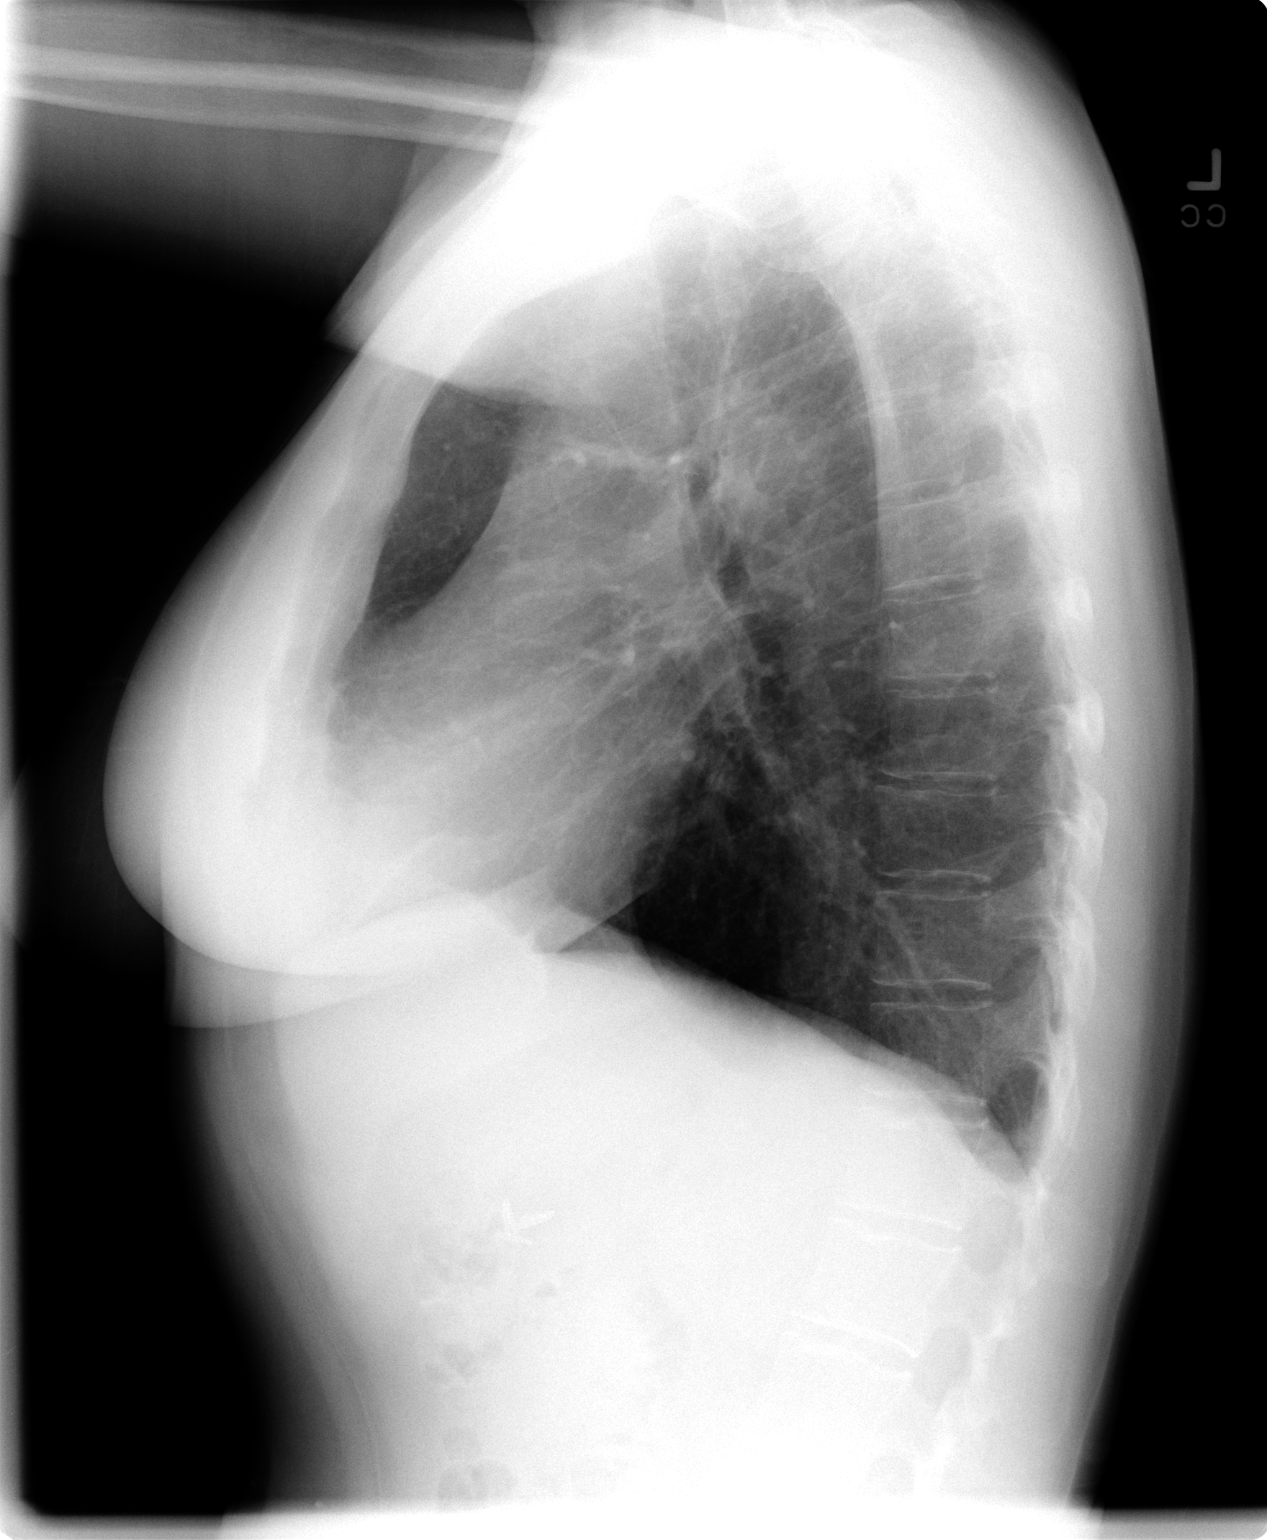

[2 of 2 positions shown; findings below may reference images not displayed]

FINDINGS: Chronic obstructive pulmonary disease without acute infiltrate or congestive heart failure.  If one wanted to evaluate for the presence of pulmonary embolus or to exclude radiographically occult malignancy, CT may then be considered.  

 IMPRESSION
 No evidence of acute abnormality.  Please see above. 

 [REDACTED]

## 2006-02-27 IMAGING — CR DG CHEST 2V
2 series · 2 of 2 positions shown · non-contrast
Comparison: 12/19/03.

CLINICAL DATA: Left-sided chest pain.  Dizziness.  COPD. 
 CHEST, TWO VIEWS

[view not recorded (1 of 2)]
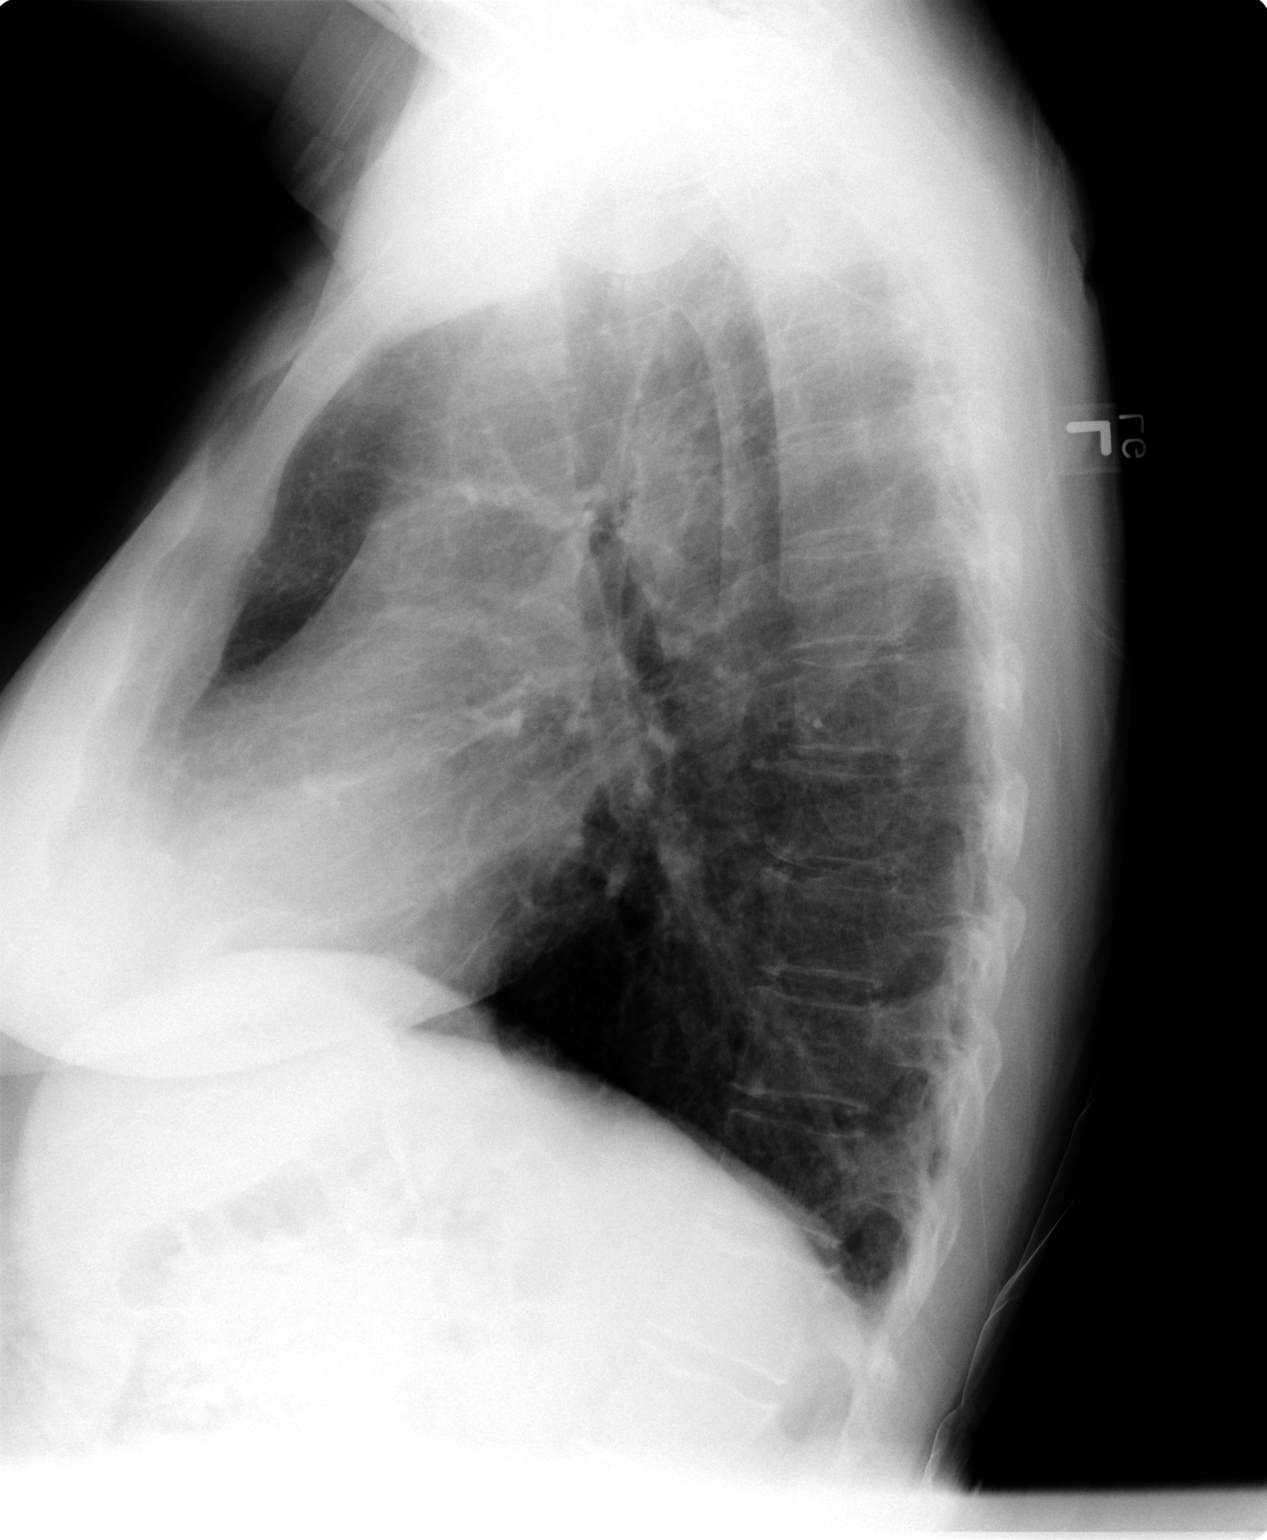

[view not recorded (2 of 2)]
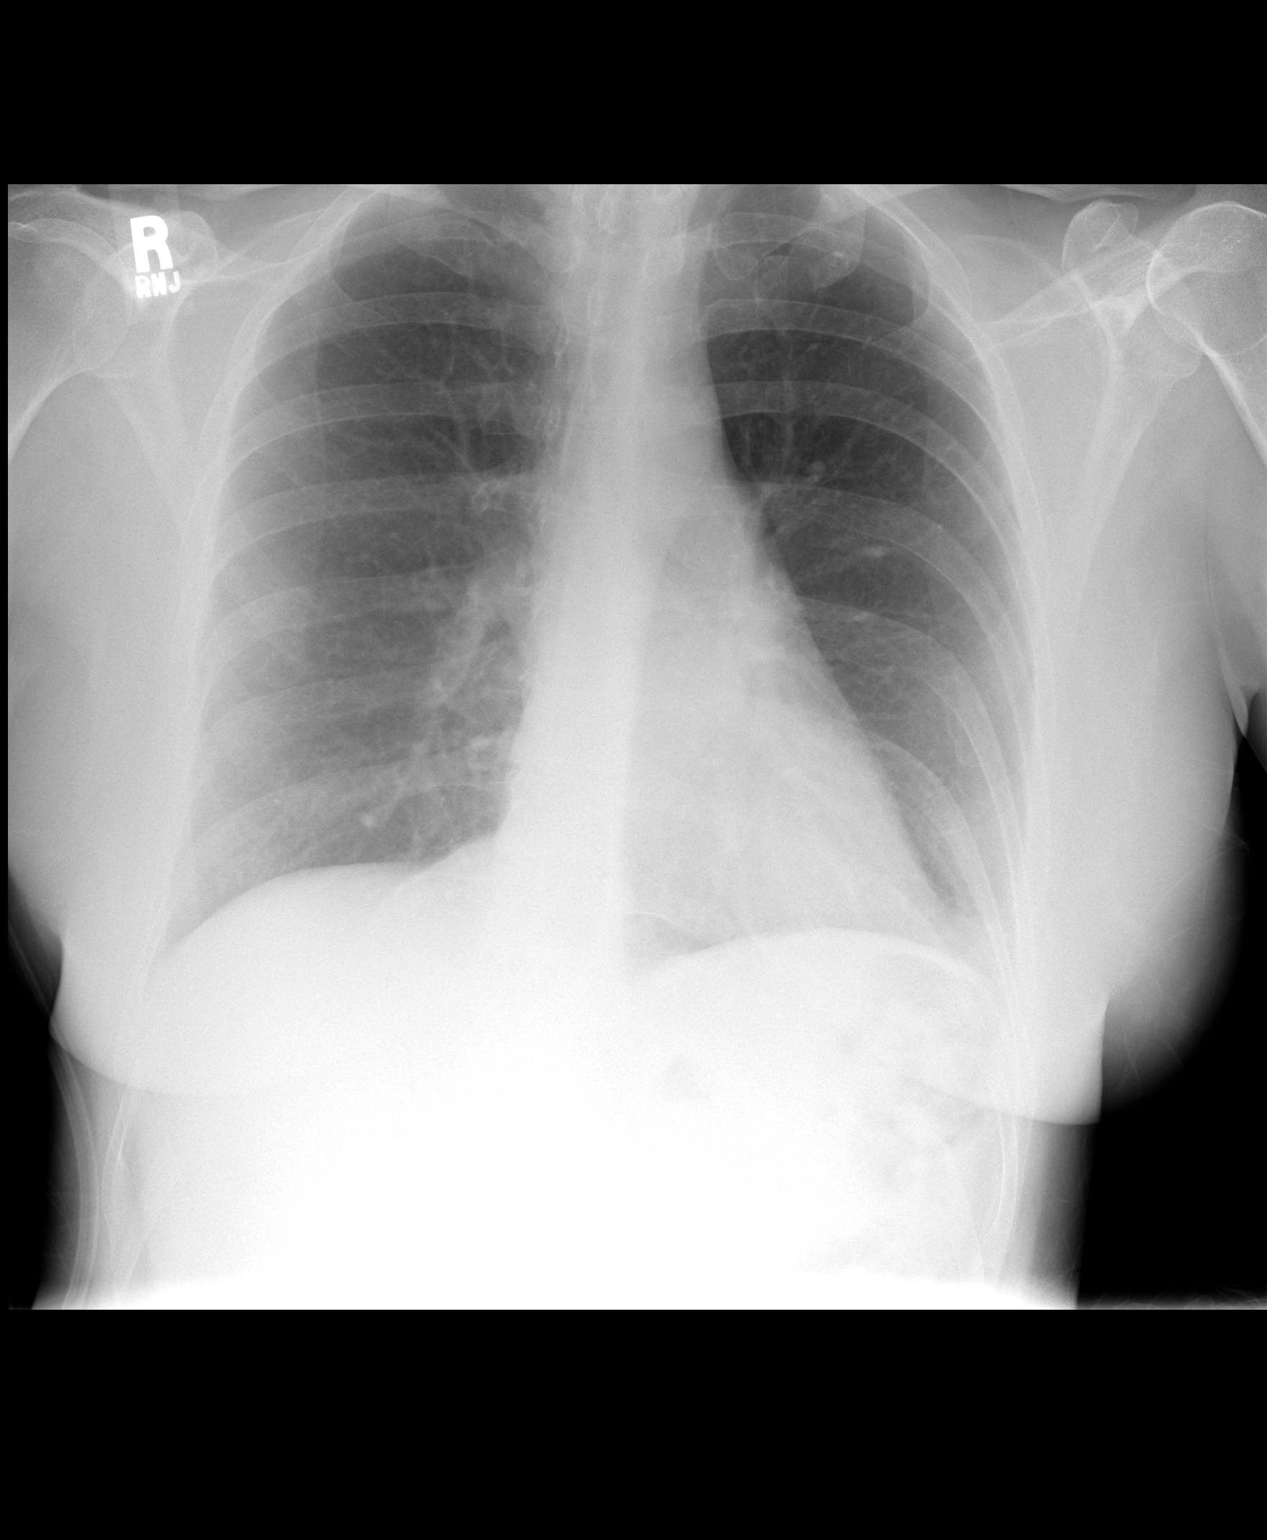

[2 of 2 positions shown; findings below may reference images not displayed]

The heart size is normal.  Pulmonary hyperinflation is seen, consistent with COPD.  Both lungs are clear.  
 IMPRESSION
 1.  No active disease. 
 2.  COPD.

## 2006-04-09 ENCOUNTER — Emergency Department (HOSPITAL_COMMUNITY): Admission: EM | Admit: 2006-04-09 | Discharge: 2006-04-09 | Payer: Self-pay | Admitting: Emergency Medicine

## 2006-04-10 IMAGING — CR DG ABDOMEN ACUTE W/ 1V CHEST
4 series · 4 of 4 positions shown · non-contrast
Comparison: none

CLINICAL DATA: Chest pain, vomiting.

 ACUTE ABDOMEN WITH CHEST
 COPD is present with a normal heart.  There is no free air.  The bowel gas is normal.  
 IMPRESSION
 COPD with no active disease with scarring in the apices, left greater than right. 
 No free air. 
 Normal bowel gas. 
 [REDACTED]

[view not recorded (1 of 4)]
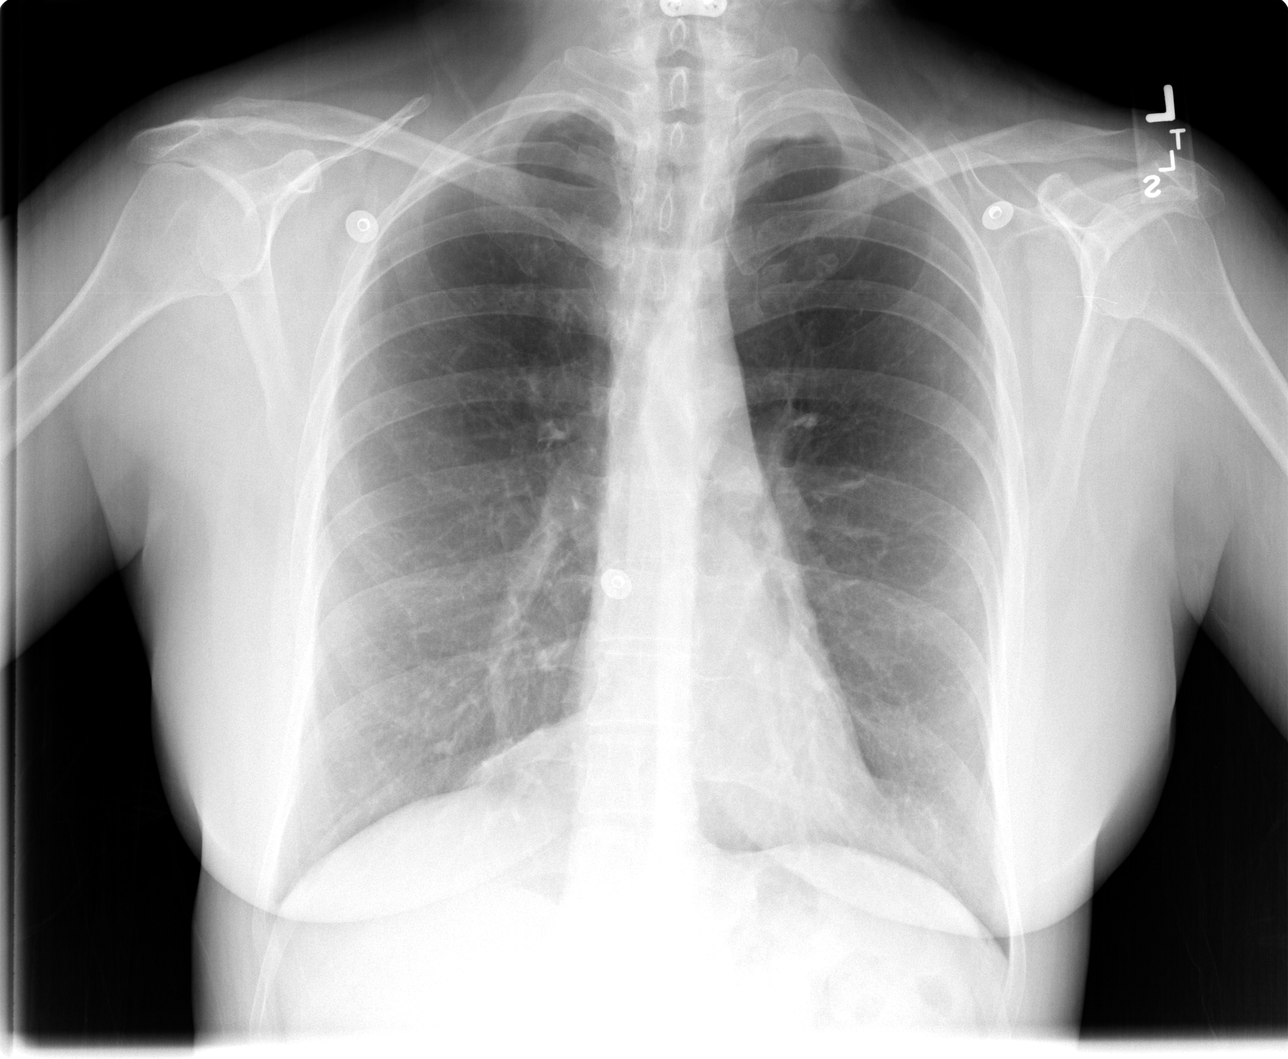

[view not recorded (2 of 4)]
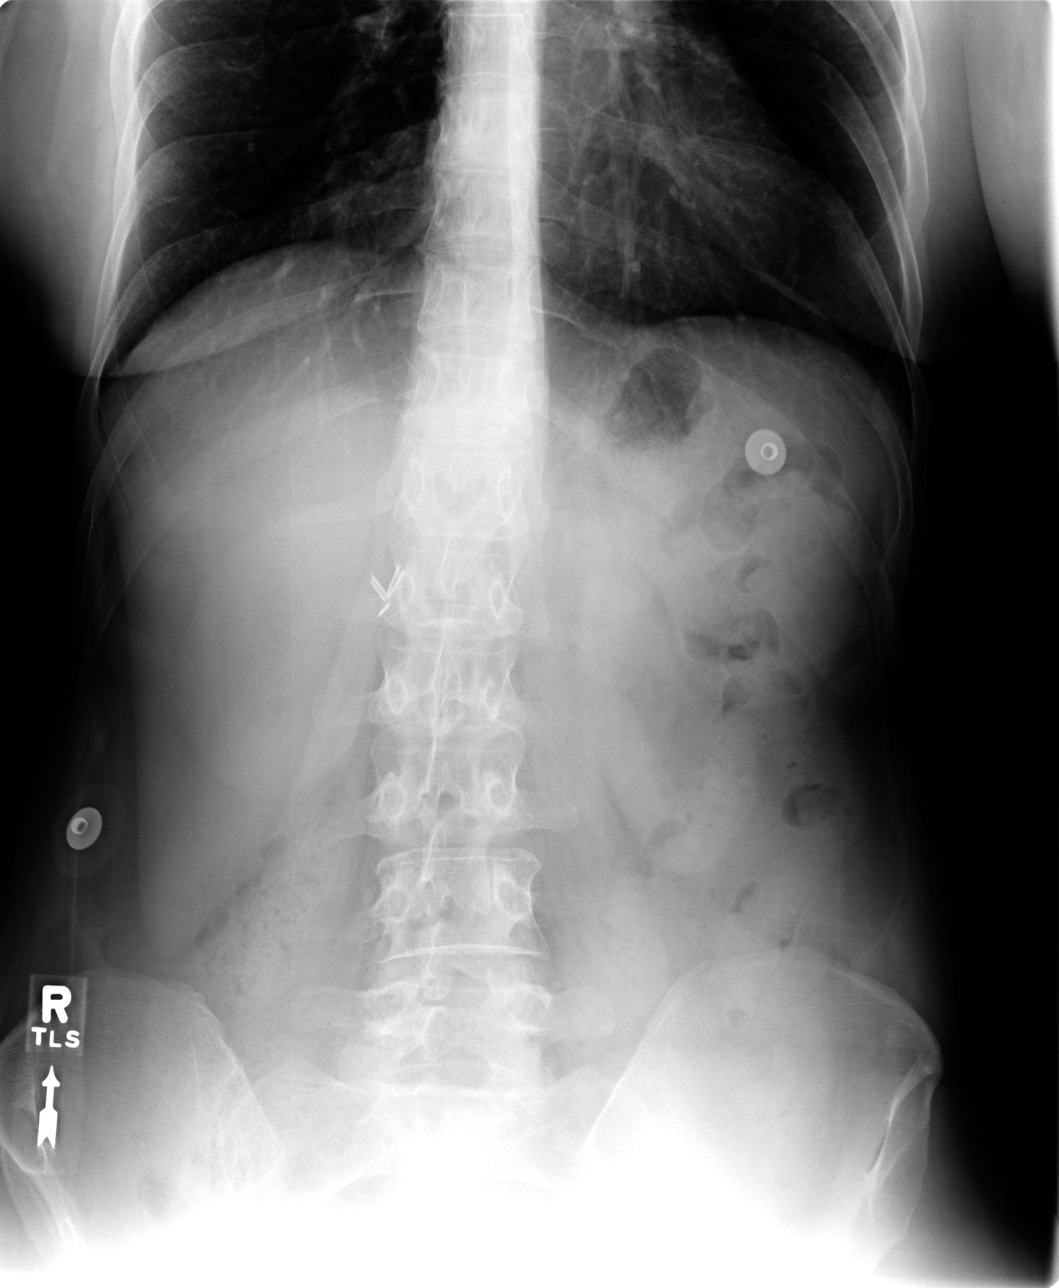

[view not recorded (3 of 4)]
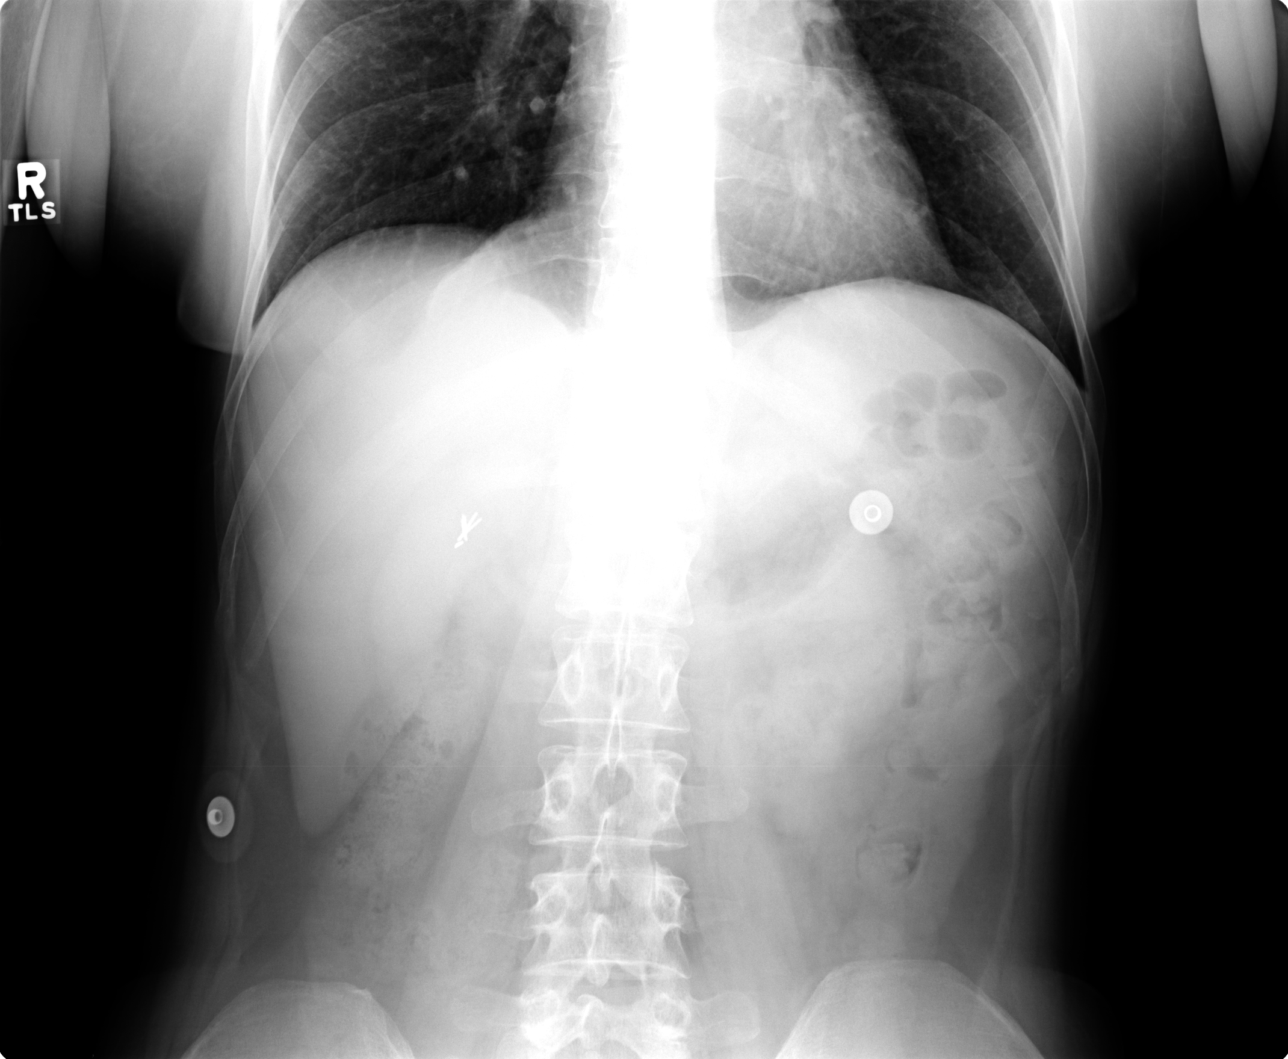

[view not recorded (4 of 4)]
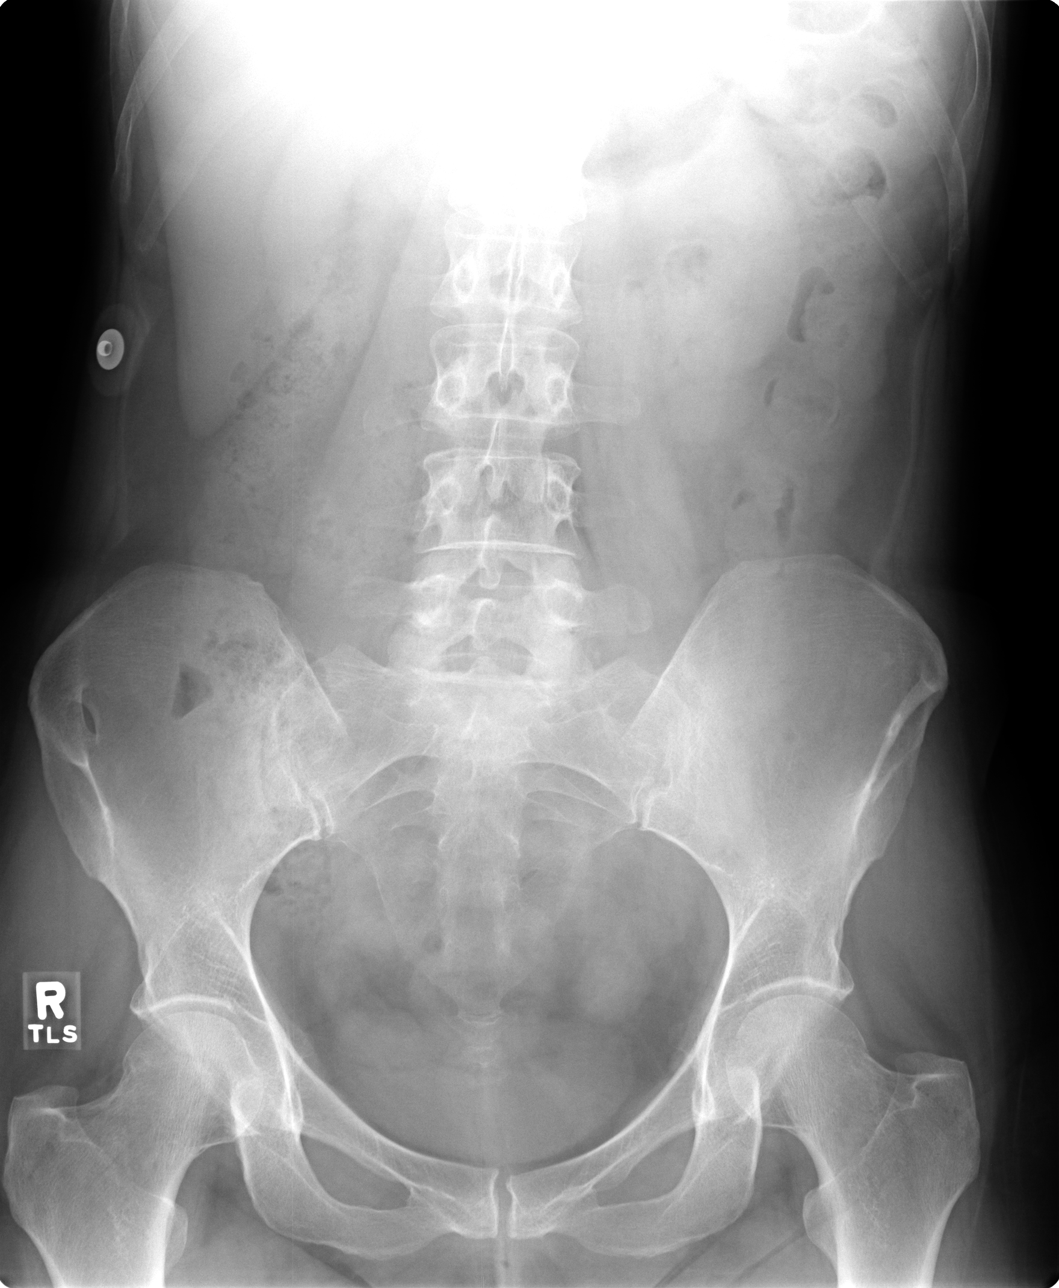

[4 of 4 positions shown; findings below may reference images not displayed]

## 2006-04-12 ENCOUNTER — Emergency Department (HOSPITAL_COMMUNITY): Admission: EM | Admit: 2006-04-12 | Discharge: 2006-04-13 | Payer: Self-pay | Admitting: Emergency Medicine

## 2006-04-24 IMAGING — CR DG HAND COMPLETE 3+V*R*
3 series · 3 of 3 positions shown · non-contrast
Comparison: none

CLINICAL DATA: Hand pain
 THREE VIEWS RIGHT HAND
 Alignment anatomic.  Negative for erosive or proliferative osseous changes. Negative for fracture. Soft tissues unremarkable.
 IMPRESSION
 Negative for fracture.
 THREE VIEWS LEFT THIRD PHALANX
 Mild soft tissue swelling along the volar aspect of the distal third phalanx.  Negative for radiopaque foreign body or soft tissue air.  Alignment of the phalanx is anatomic without fracture.
 Soft tissue fullness as described, nonspecific.  Negative for fracture.

[view not recorded (1 of 3)]
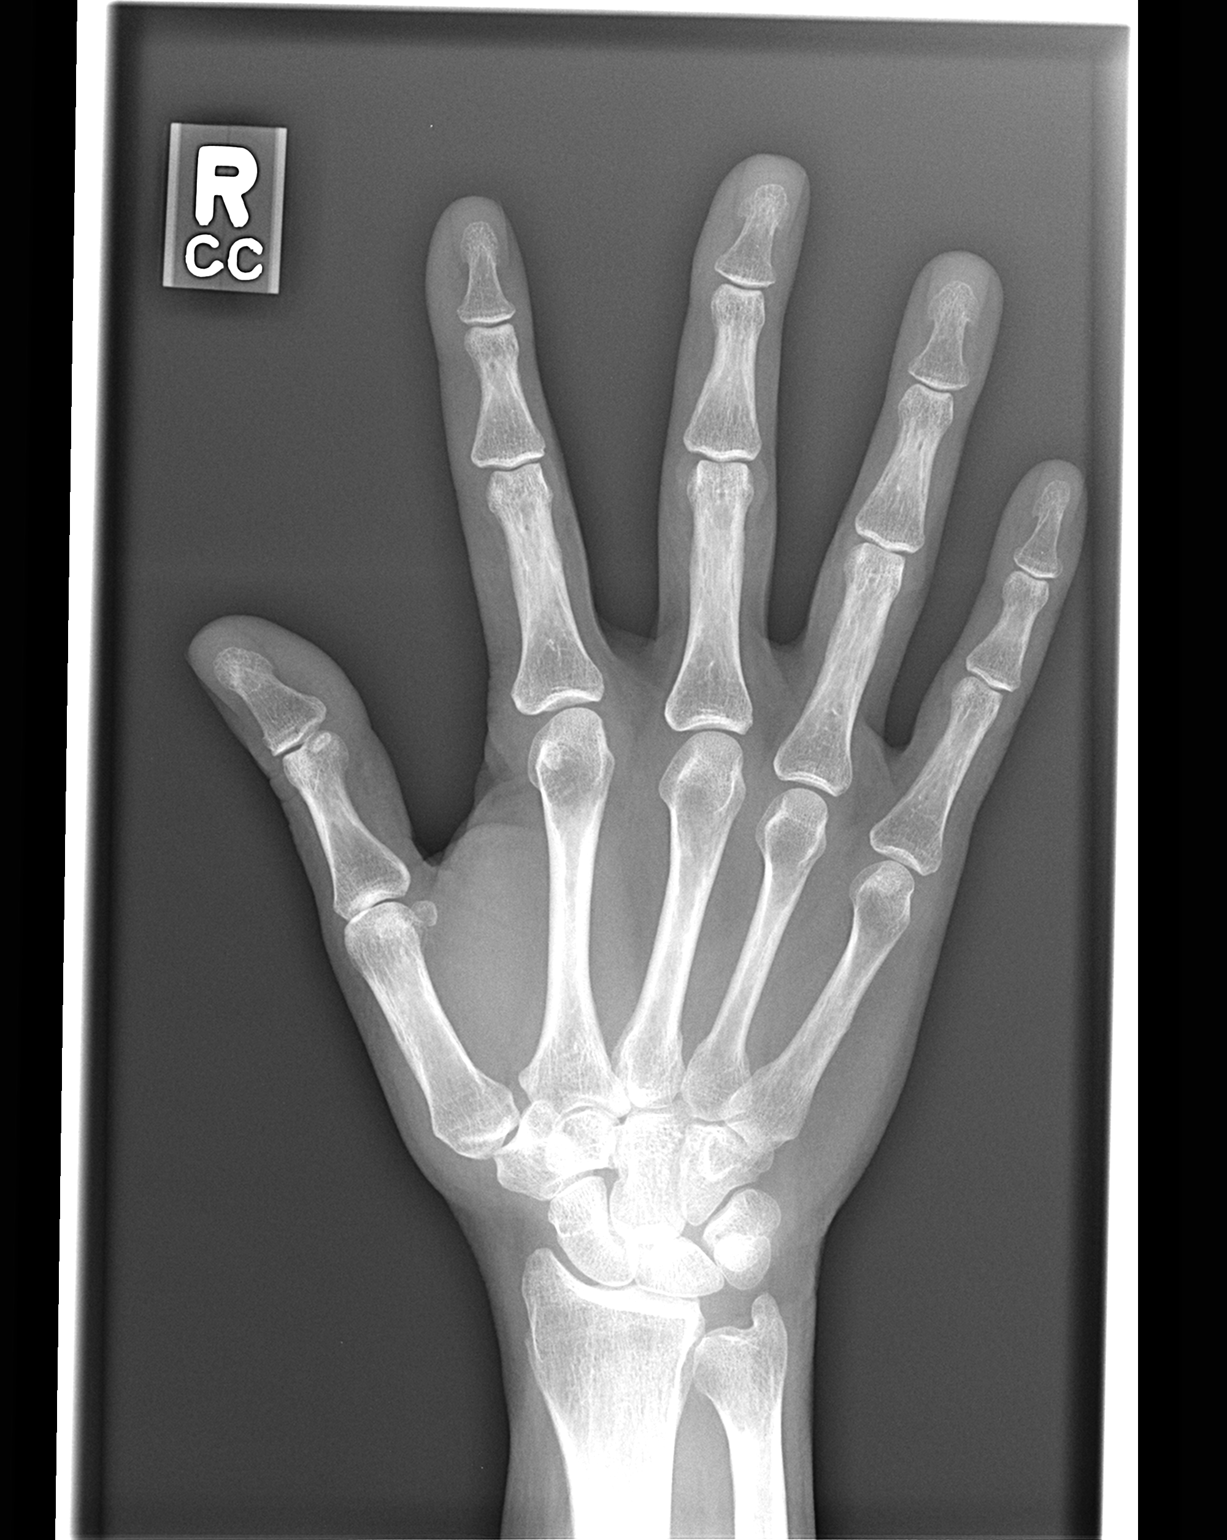

[view not recorded (2 of 3)]
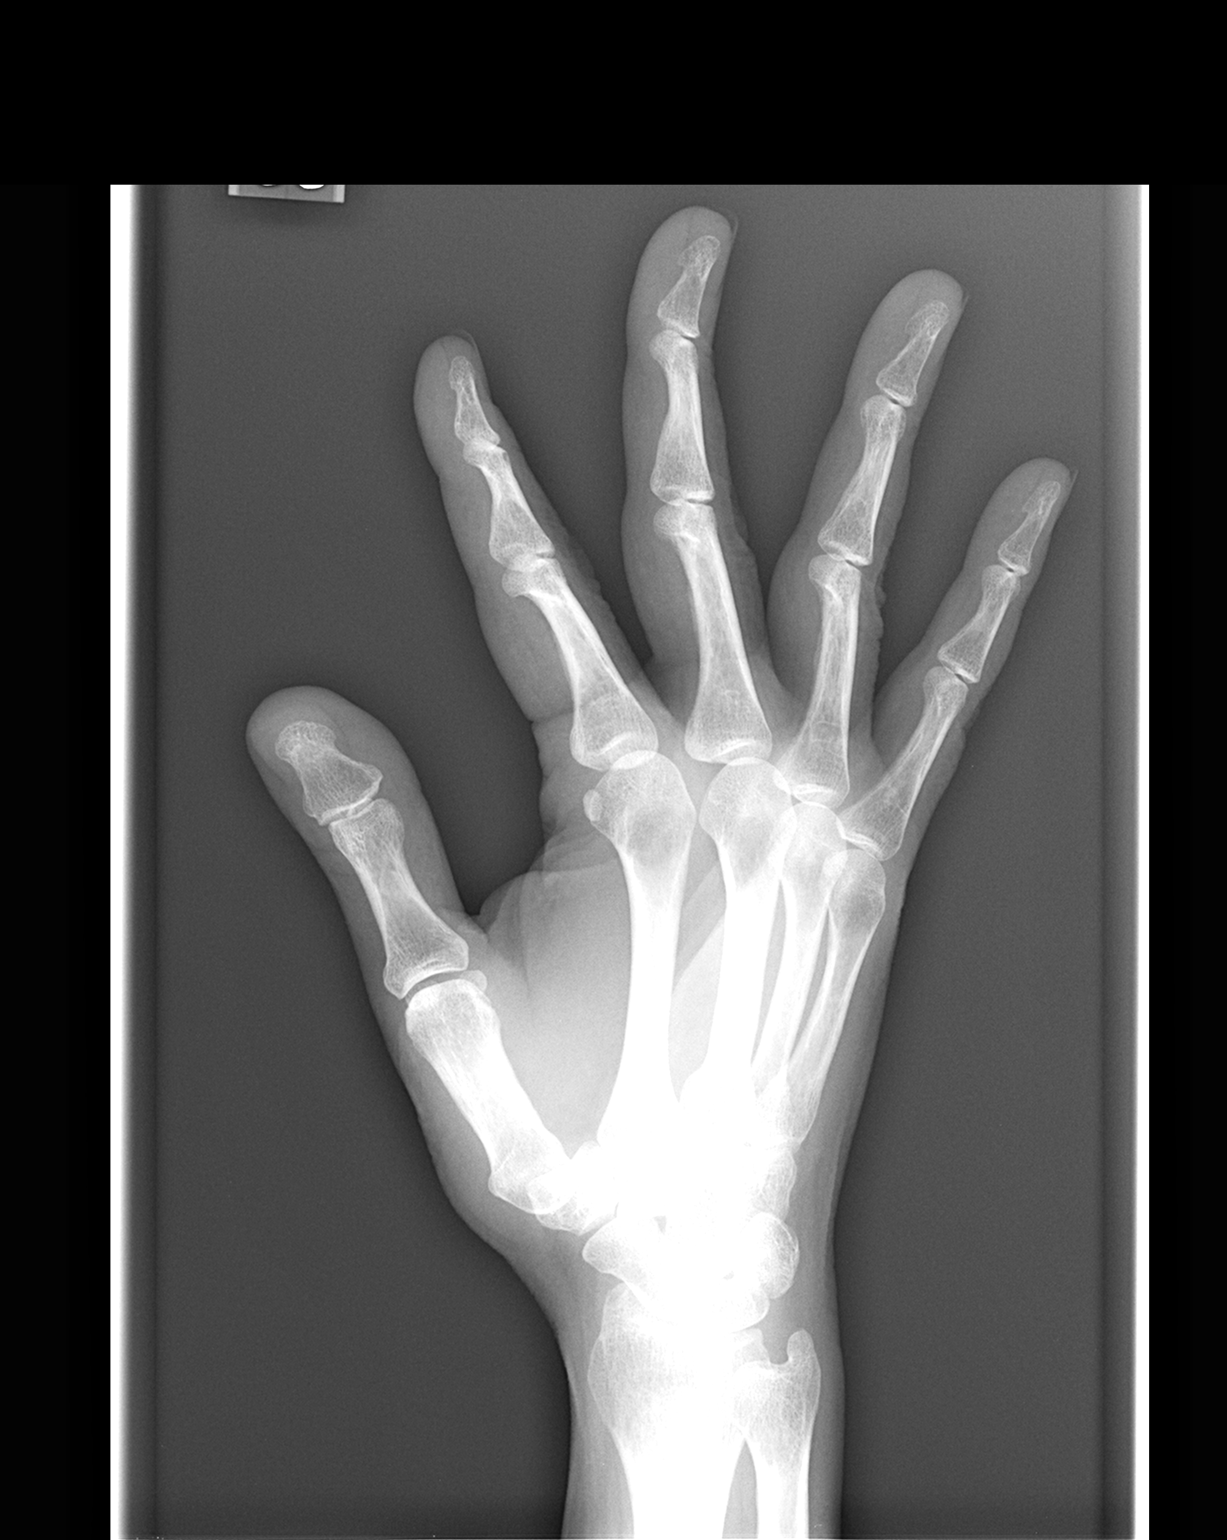

[view not recorded (3 of 3)]
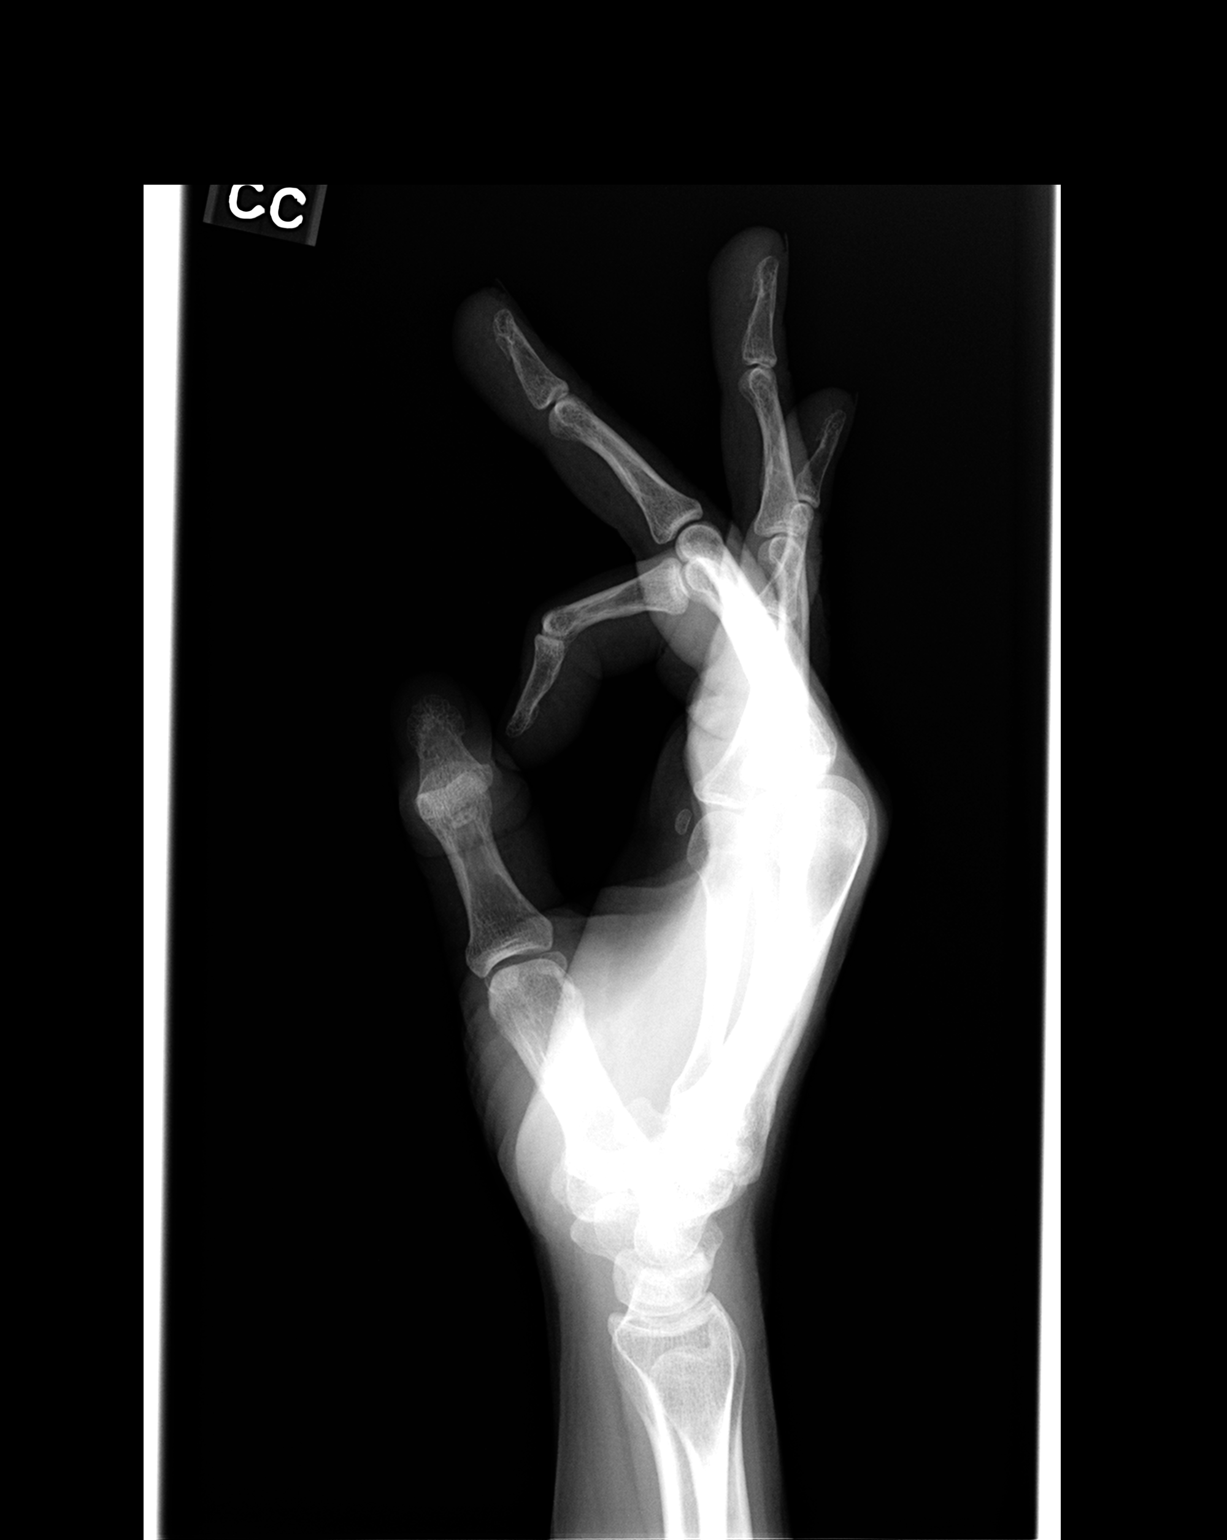

[3 of 3 positions shown; findings below may reference images not displayed]

## 2006-04-24 IMAGING — CR DG FINGER MIDDLE 2+V*L*
1 series · 1 of 1 positions shown · non-contrast
Comparison: none

CLINICAL DATA: Hand pain
 THREE VIEWS RIGHT HAND
 Alignment anatomic.  Negative for erosive or proliferative osseous changes. Negative for fracture. Soft tissues unremarkable.
 IMPRESSION
 Negative for fracture.
 THREE VIEWS LEFT THIRD PHALANX
 Mild soft tissue swelling along the volar aspect of the distal third phalanx.  Negative for radiopaque foreign body or soft tissue air.  Alignment of the phalanx is anatomic without fracture.
 Soft tissue fullness as described, nonspecific.  Negative for fracture.

[view not recorded]
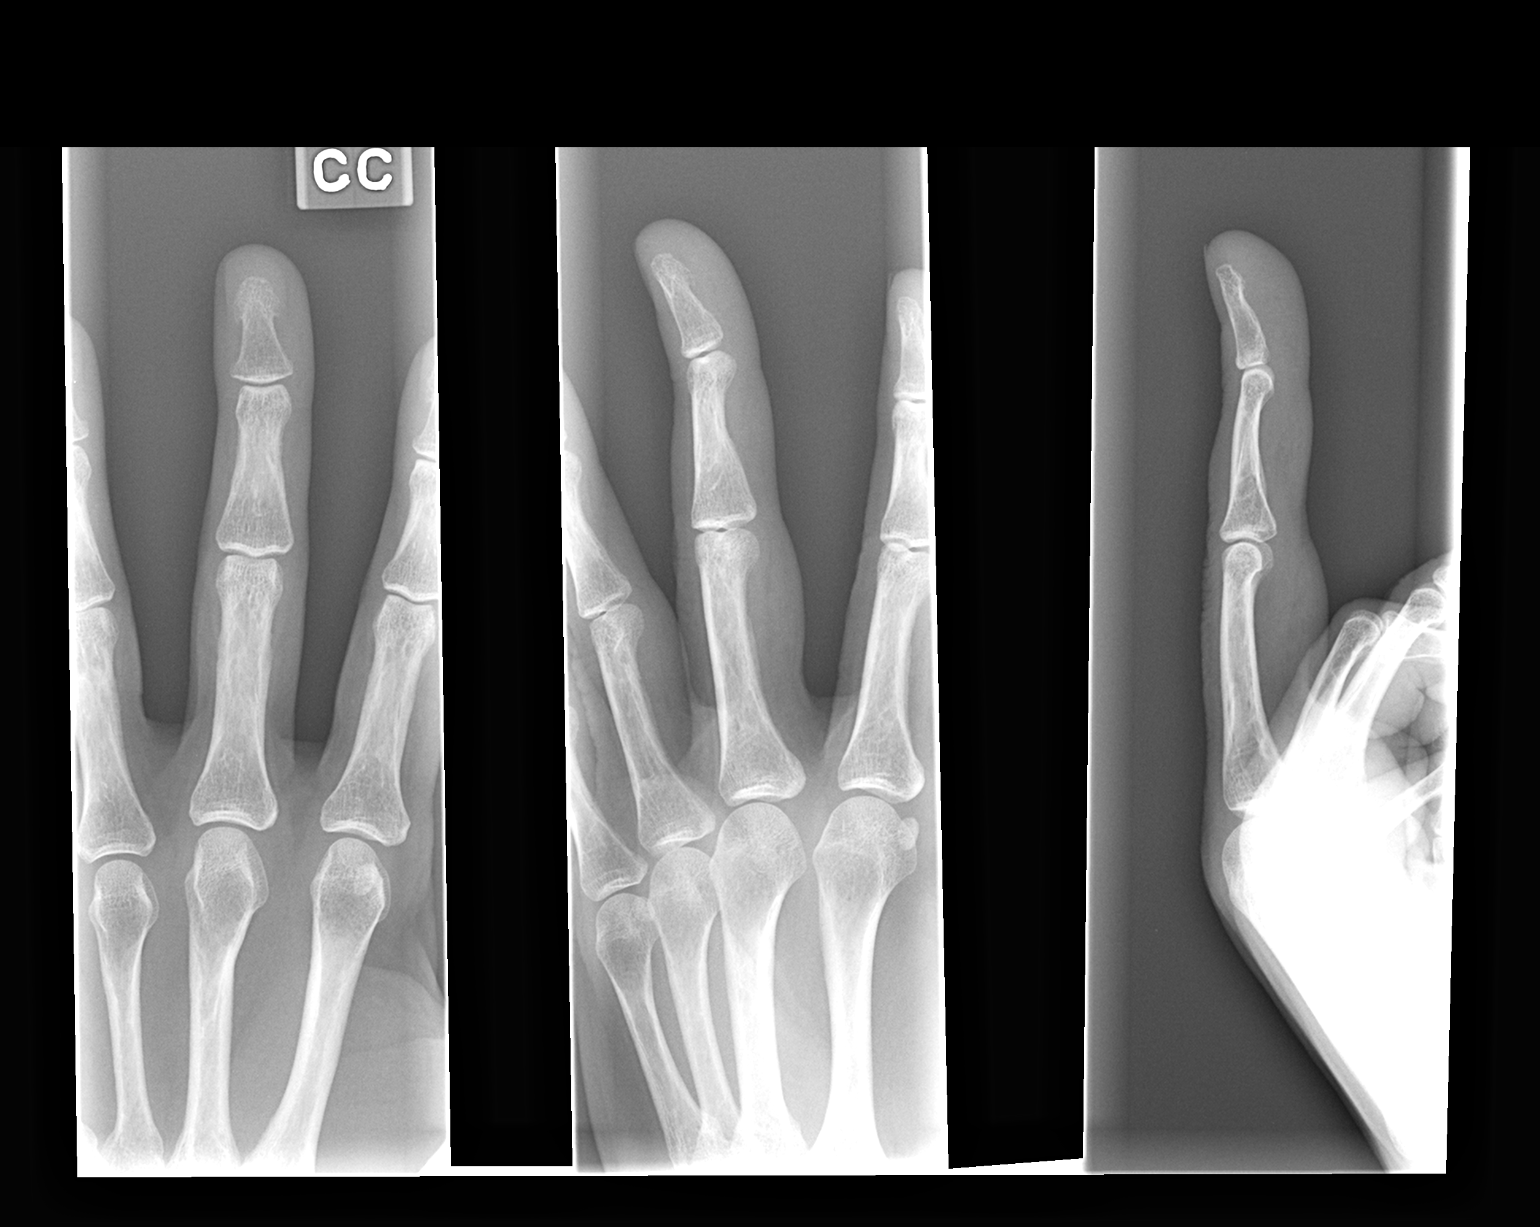

[1 of 1 positions shown; findings below may reference images not displayed]

## 2006-04-28 ENCOUNTER — Ambulatory Visit (HOSPITAL_COMMUNITY): Admission: RE | Admit: 2006-04-28 | Discharge: 2006-04-28 | Payer: Self-pay | Admitting: Internal Medicine

## 2006-05-31 IMAGING — CR DG ELBOW COMPLETE 3+V*R*
4 series · 4 of 4 positions shown · non-contrast
Comparison: none

CLINICAL DATA: Fall.  Pain. 
 RIGHT ELBOW COMPLETE 
 There is no evidence of fracture, dislocation or other significant bone abnormality.  There is no evidence of joint effusion. 

 IMPRESSION
 Normal study.

[view not recorded (1 of 4)]
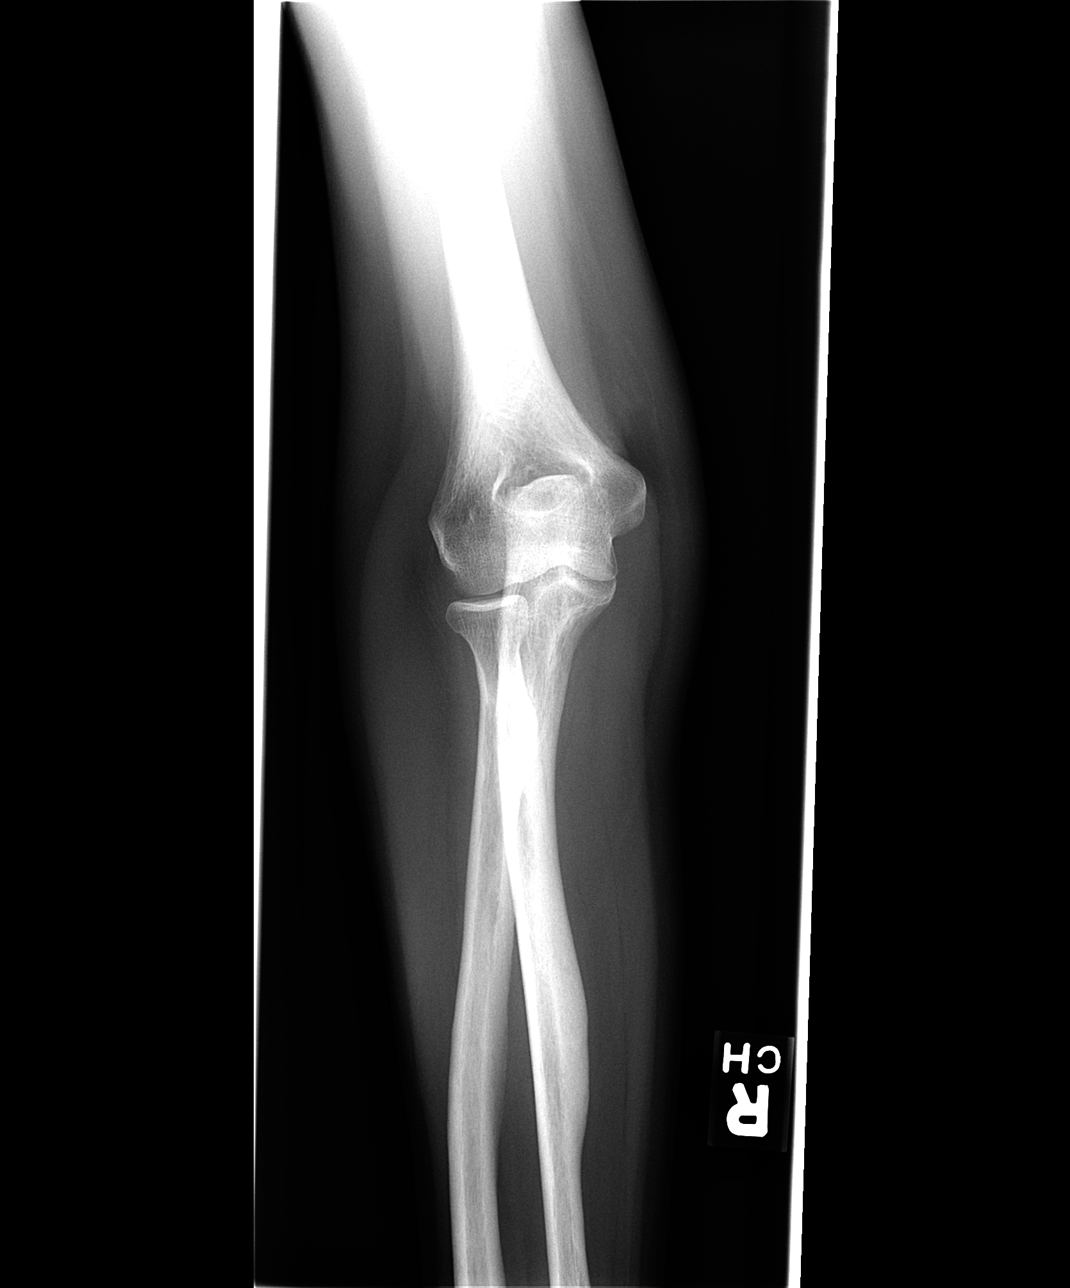

[view not recorded (2 of 4)]
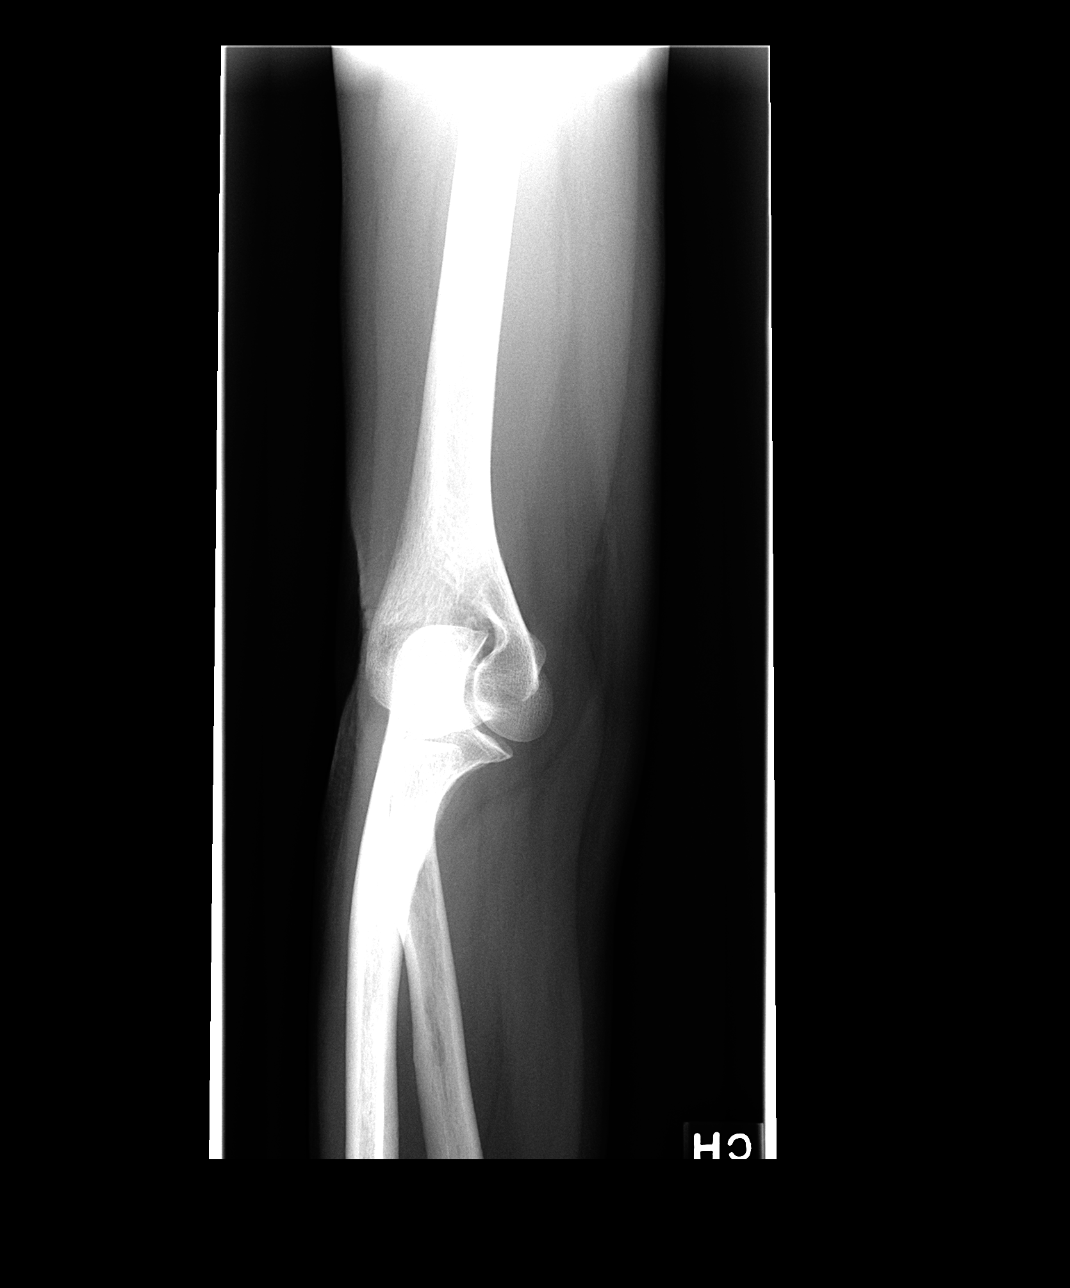

[view not recorded (3 of 4)]
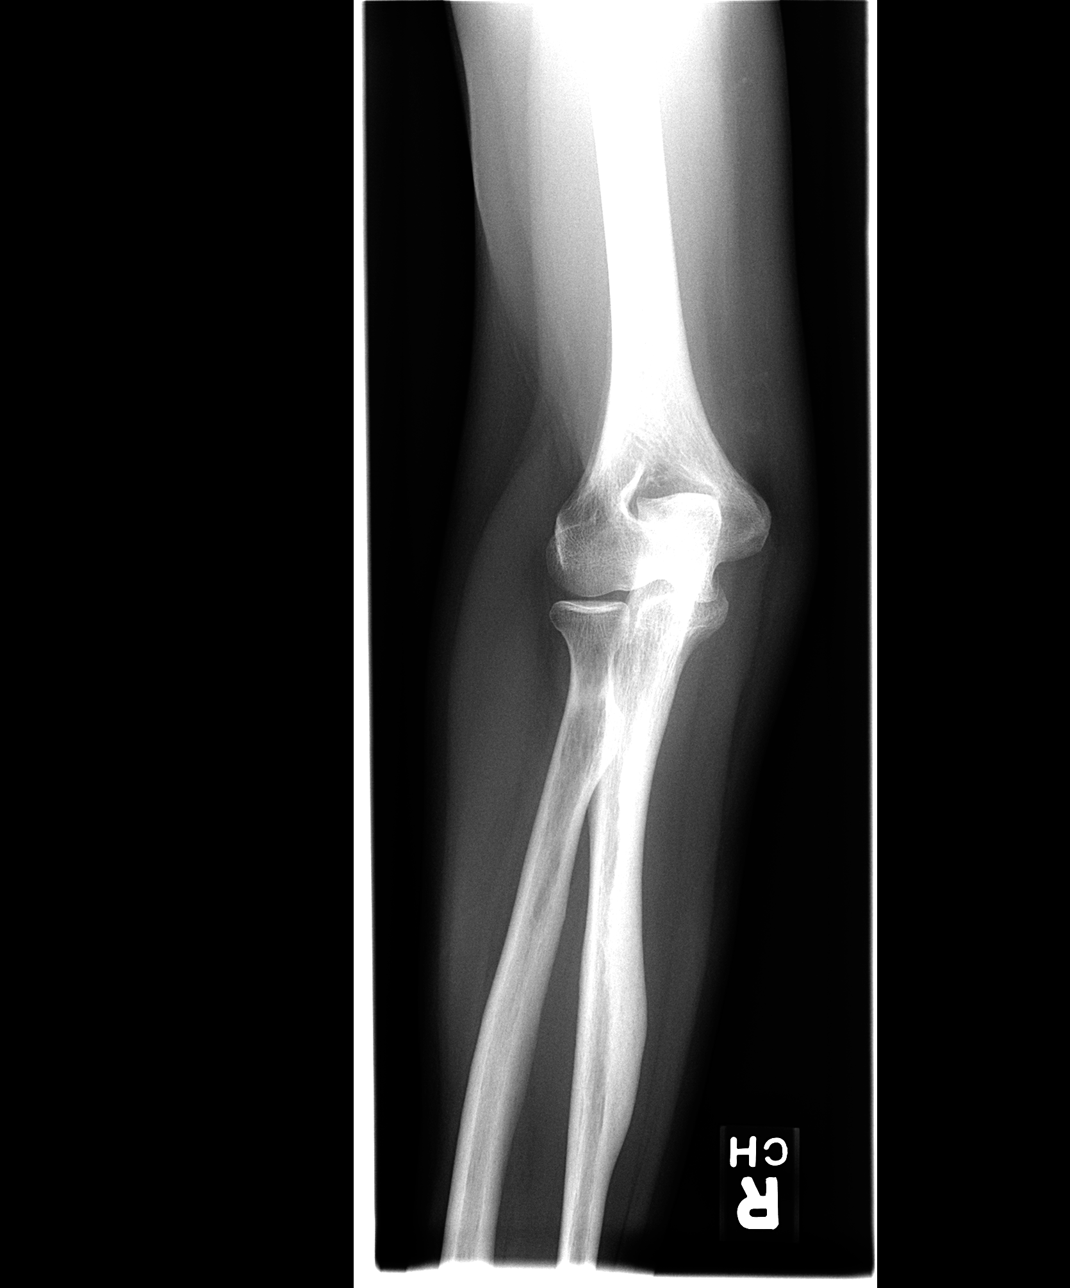

[view not recorded (4 of 4)]
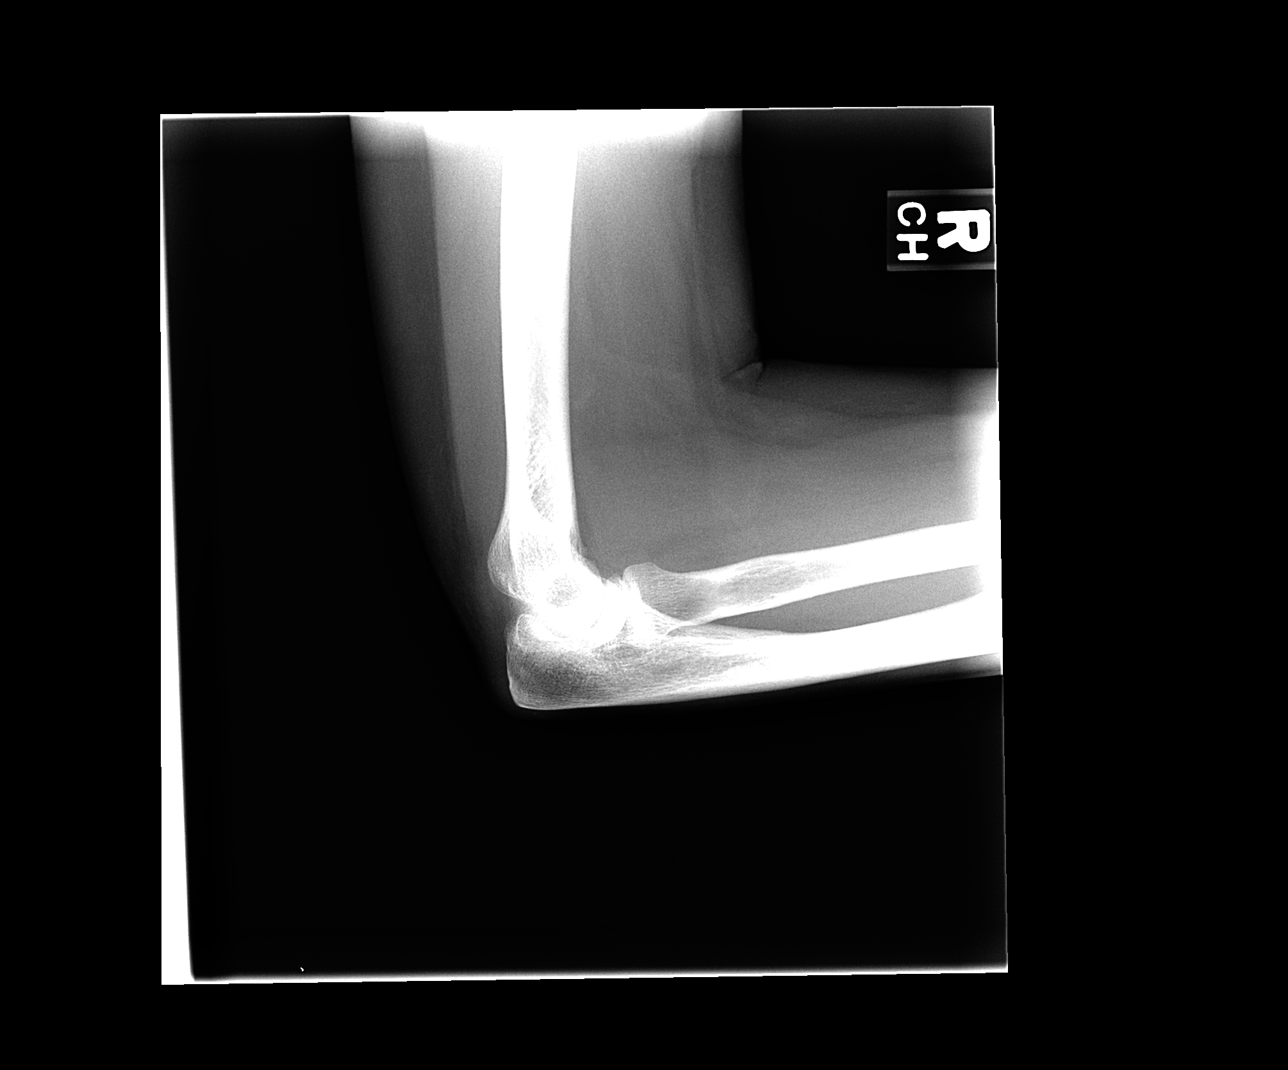

[4 of 4 positions shown; findings below may reference images not displayed]

## 2006-05-31 IMAGING — CR DG WRIST COMPLETE 3+V*R*
2 series · 2 of 2 positions shown · non-contrast
Comparison: none

CLINICAL DATA: Fell.  Right shoulder, elbow and wrist pain.  
 RIGHT WRIST 04/06/04 
 There are no fractures or dislocations.  There is ulnar minus variance. 
 IMPRESSION
 Ulnar minus variance.  No fracture or dislocation.

[view not recorded (1 of 2)]
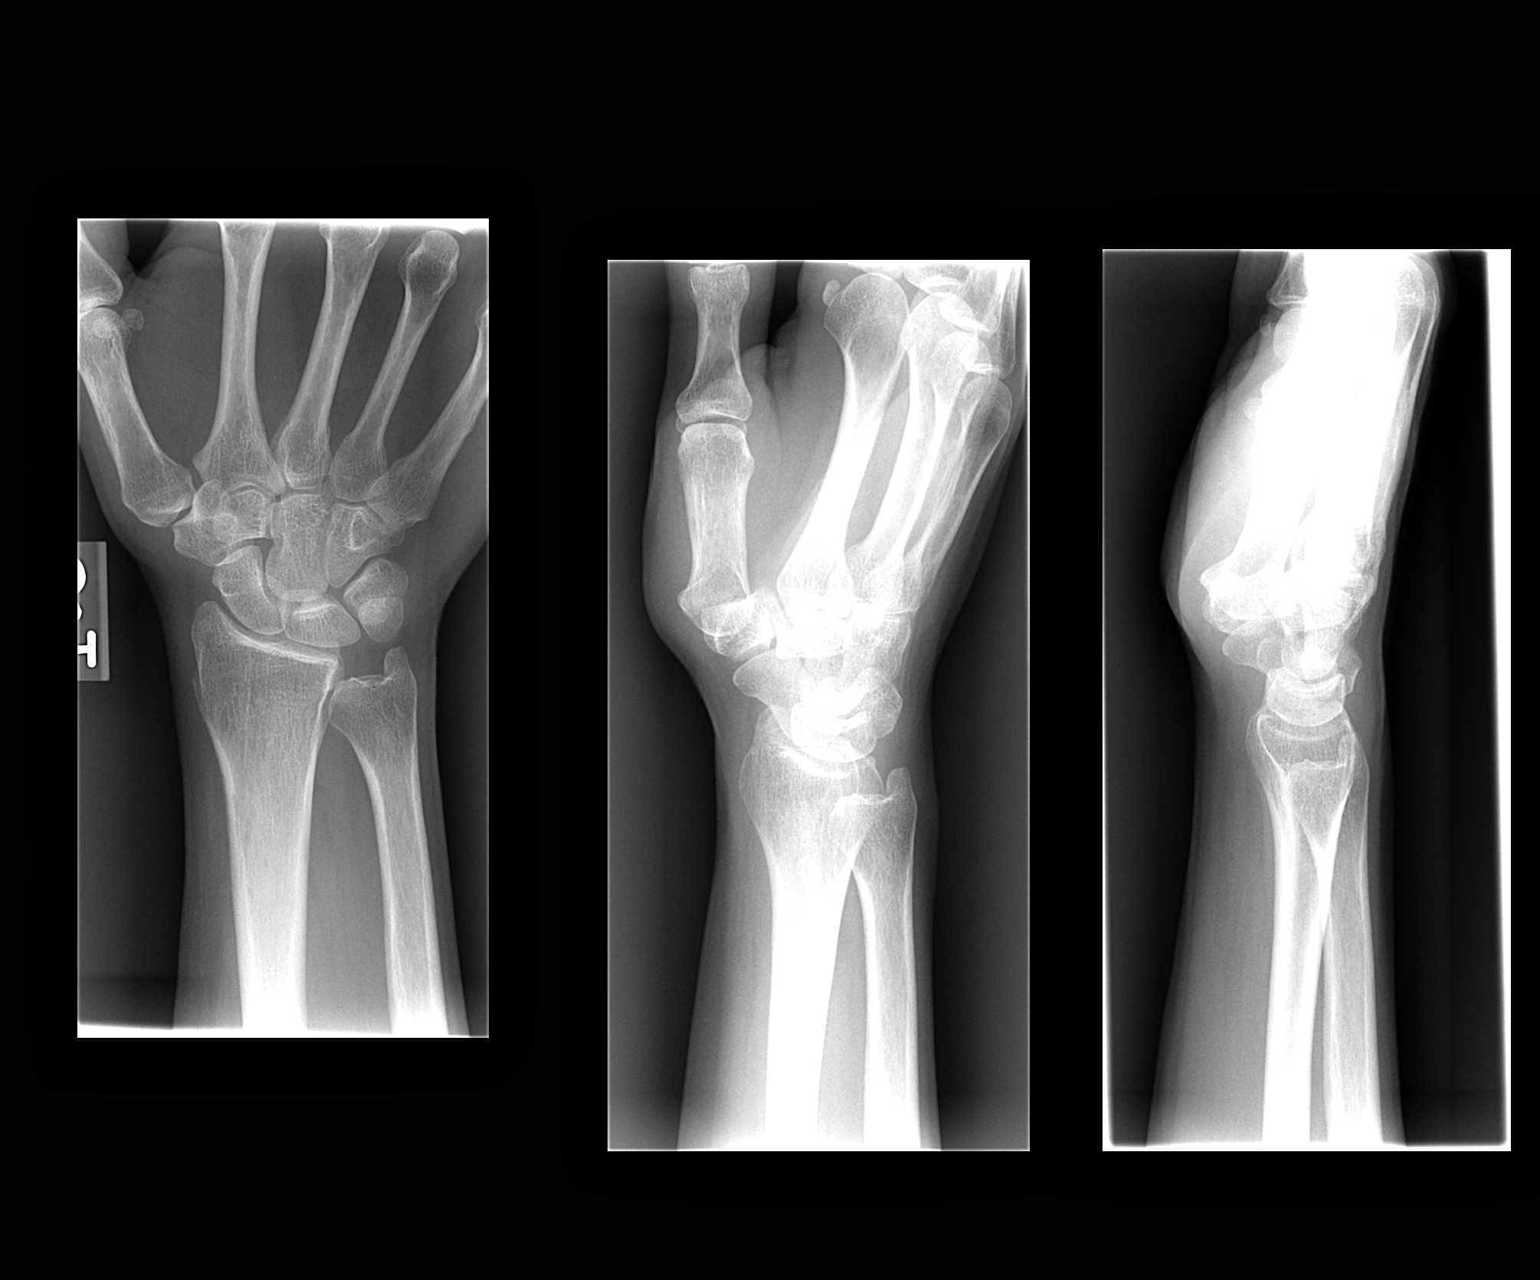

[view not recorded (2 of 2)]
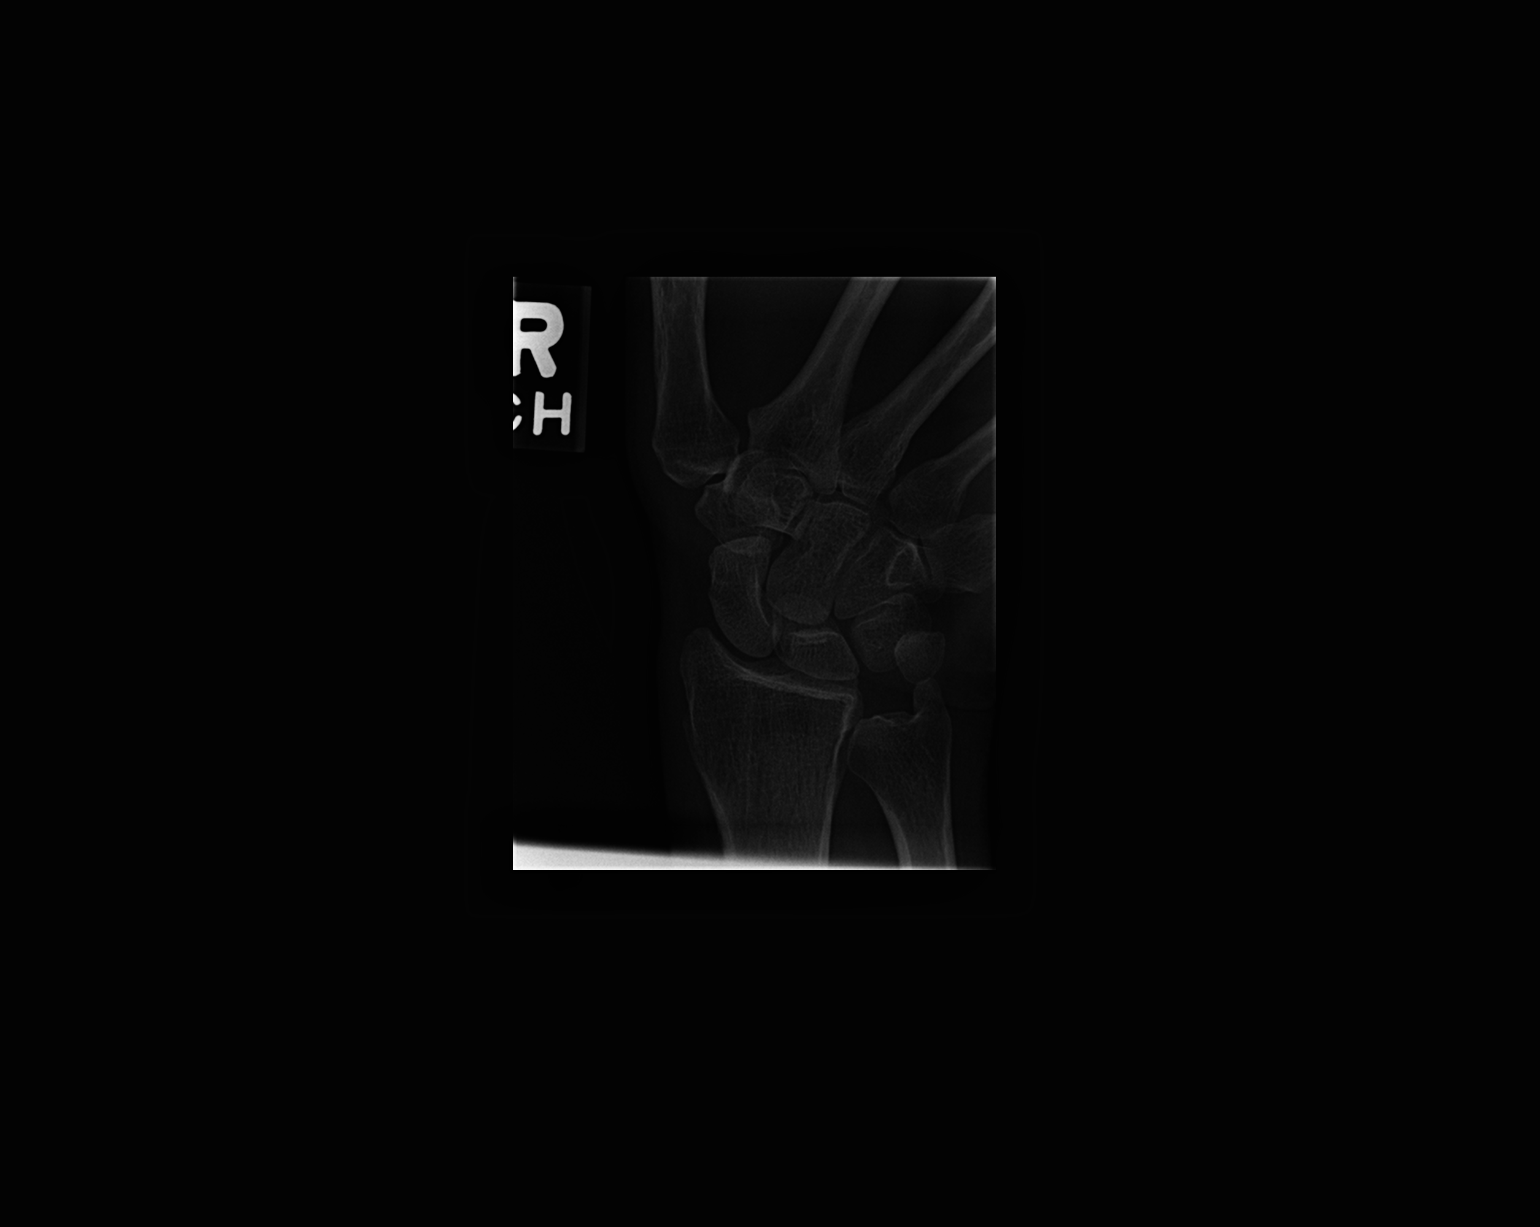

[2 of 2 positions shown; findings below may reference images not displayed]

## 2006-06-18 ENCOUNTER — Emergency Department (HOSPITAL_COMMUNITY): Admission: EM | Admit: 2006-06-18 | Discharge: 2006-06-18 | Payer: Self-pay | Admitting: Emergency Medicine

## 2006-07-05 ENCOUNTER — Emergency Department (HOSPITAL_COMMUNITY): Admission: EM | Admit: 2006-07-05 | Discharge: 2006-07-05 | Payer: Self-pay | Admitting: Emergency Medicine

## 2006-07-10 ENCOUNTER — Emergency Department (HOSPITAL_COMMUNITY): Admission: EM | Admit: 2006-07-10 | Discharge: 2006-07-10 | Payer: Self-pay | Admitting: Emergency Medicine

## 2006-07-22 ENCOUNTER — Encounter: Admission: RE | Admit: 2006-07-22 | Discharge: 2006-07-22 | Payer: Self-pay | Admitting: Internal Medicine

## 2006-09-29 ENCOUNTER — Ambulatory Visit (HOSPITAL_COMMUNITY): Admission: RE | Admit: 2006-09-29 | Discharge: 2006-09-29 | Payer: Self-pay

## 2006-11-05 ENCOUNTER — Emergency Department (HOSPITAL_COMMUNITY): Admission: EM | Admit: 2006-11-05 | Discharge: 2006-11-05 | Payer: Self-pay | Admitting: Emergency Medicine

## 2006-11-10 IMAGING — CR DG CHEST 2V
2 series · 2 of 2 positions shown · non-contrast
Comparison: none

CLINICAL DATA: Emphysema.  Asthma. Smoker. Cough.
 CHEST ? TWO VIEW:
 Comparing 01/04/04.

[view not recorded (1 of 2)]
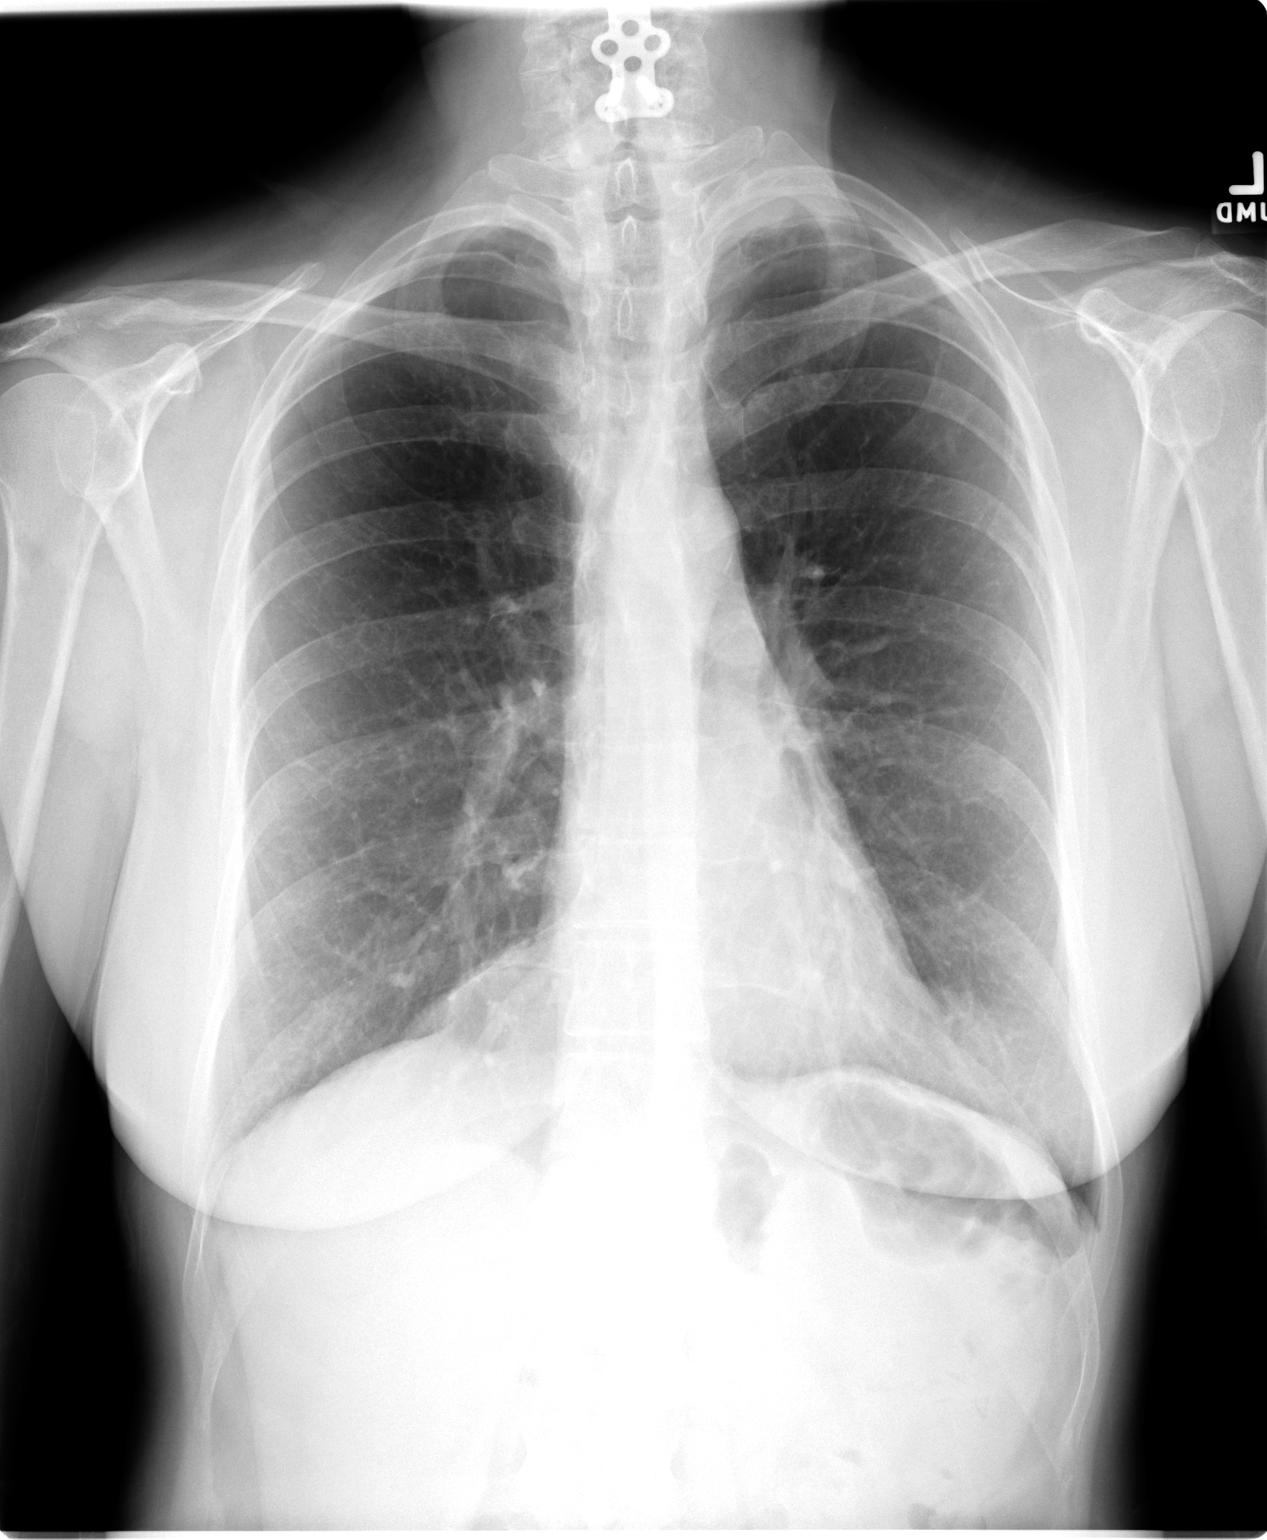

[view not recorded (2 of 2)]
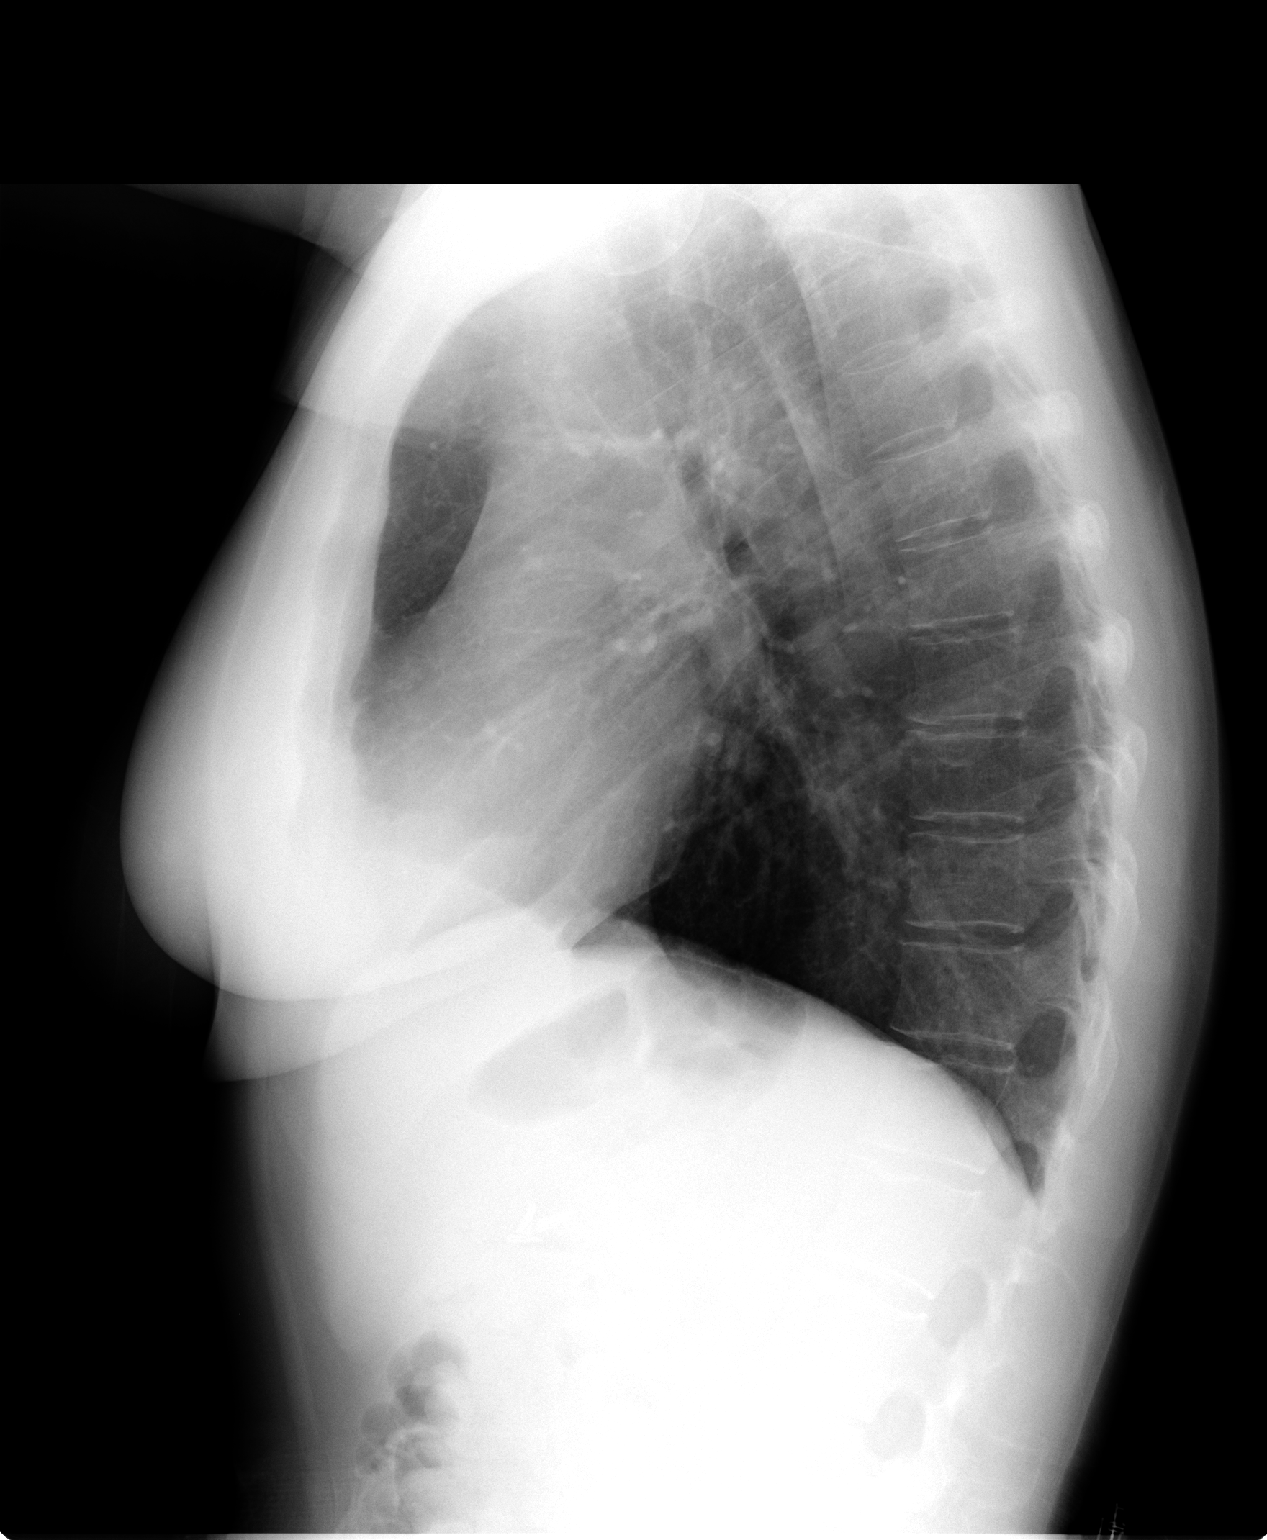

[2 of 2 positions shown; findings below may reference images not displayed]

FINDINGS: Tapering of the peripheral pulmonary vasculature is consistent with emphysema.  Lower cervical plate and screw fixation is noted. Mild stable biapical pleuroparenchymal scarring.  No airspace opacity is identified.
IMPRESSION: 1.  Emphysema.

## 2006-11-25 ENCOUNTER — Emergency Department (HOSPITAL_COMMUNITY): Admission: EM | Admit: 2006-11-25 | Discharge: 2006-11-26 | Payer: Self-pay | Admitting: Emergency Medicine

## 2006-12-30 ENCOUNTER — Ambulatory Visit (HOSPITAL_COMMUNITY): Admission: RE | Admit: 2006-12-30 | Discharge: 2006-12-30 | Payer: Self-pay | Admitting: General Surgery

## 2007-01-15 ENCOUNTER — Emergency Department (HOSPITAL_COMMUNITY): Admission: EM | Admit: 2007-01-15 | Discharge: 2007-01-15 | Payer: Self-pay | Admitting: Emergency Medicine

## 2007-02-06 IMAGING — CR DG ABDOMEN ACUTE W/ 1V CHEST
3 series · 3 of 3 positions shown · non-contrast
Comparison: Chest x-ray dated 09/16/04 and abdominal series dated 02/15/04.

CLINICAL DATA: Weakness; nausea; vomiting and diarrhea; hx of COPD 
 ACUTE ABDOMINAL SERIES WITH UPRIGHT CHEST:

[w chest pa]
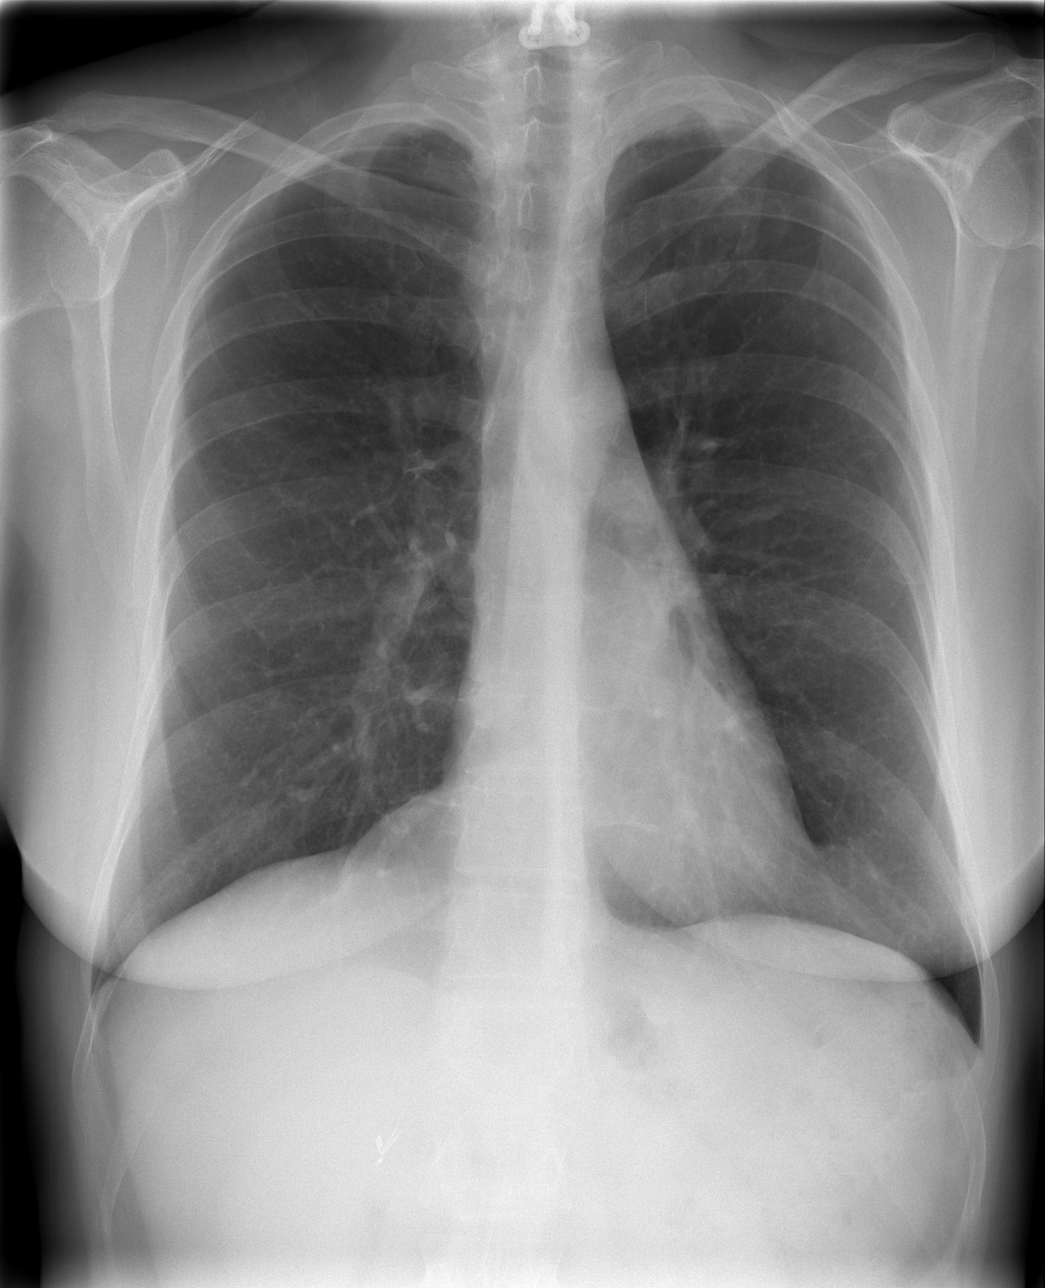

[w abdomen upright *]
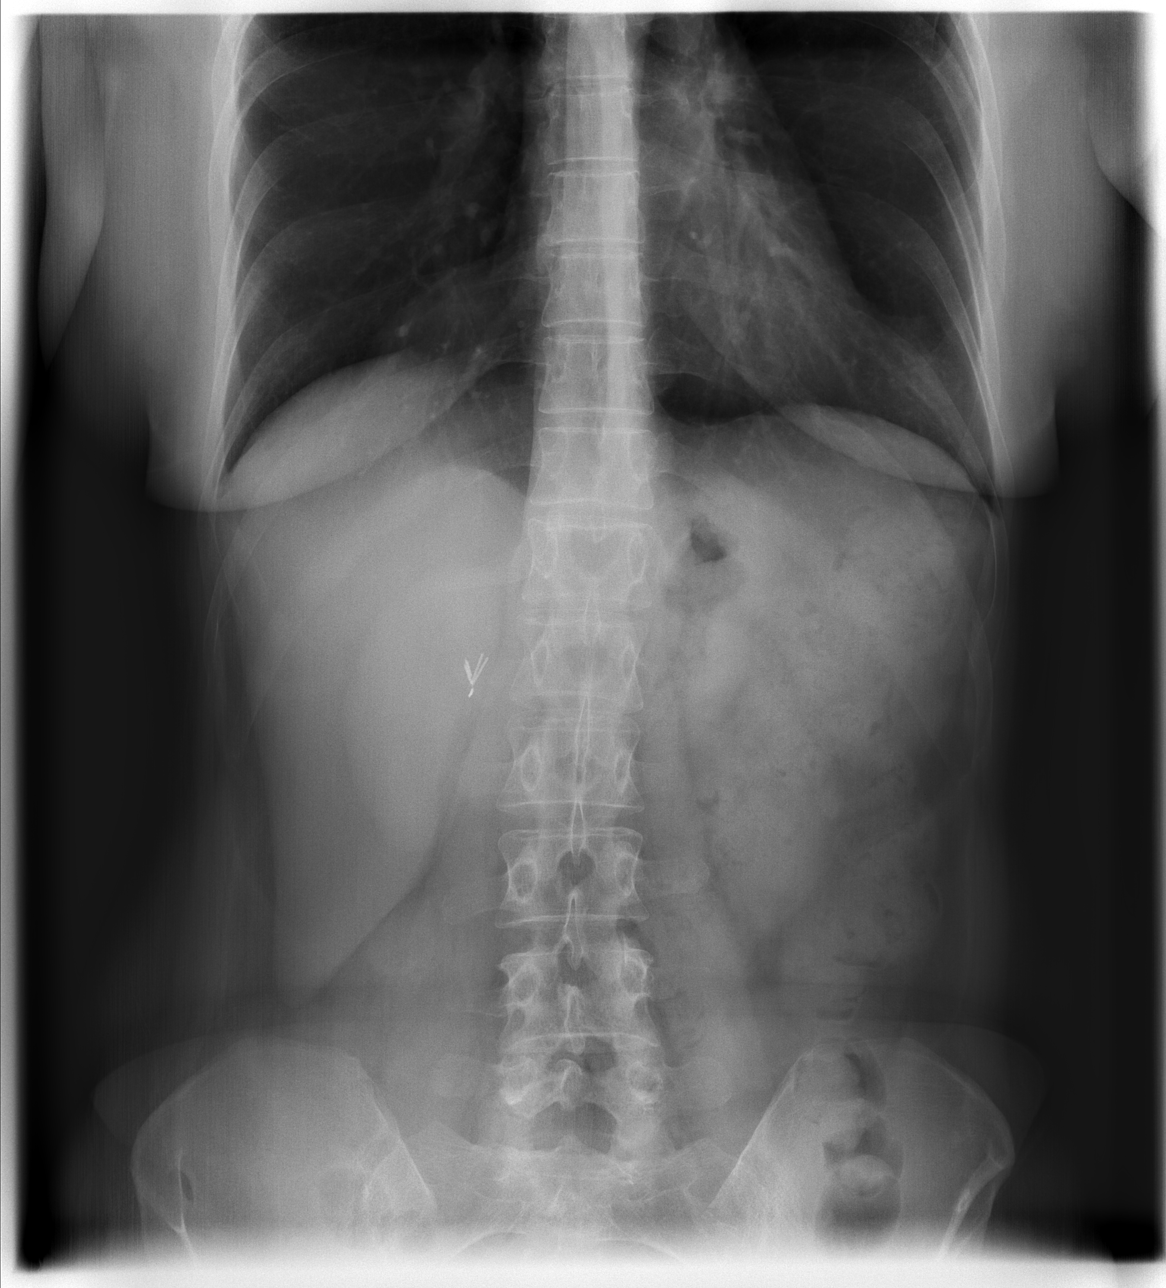

[t abdomen supine]
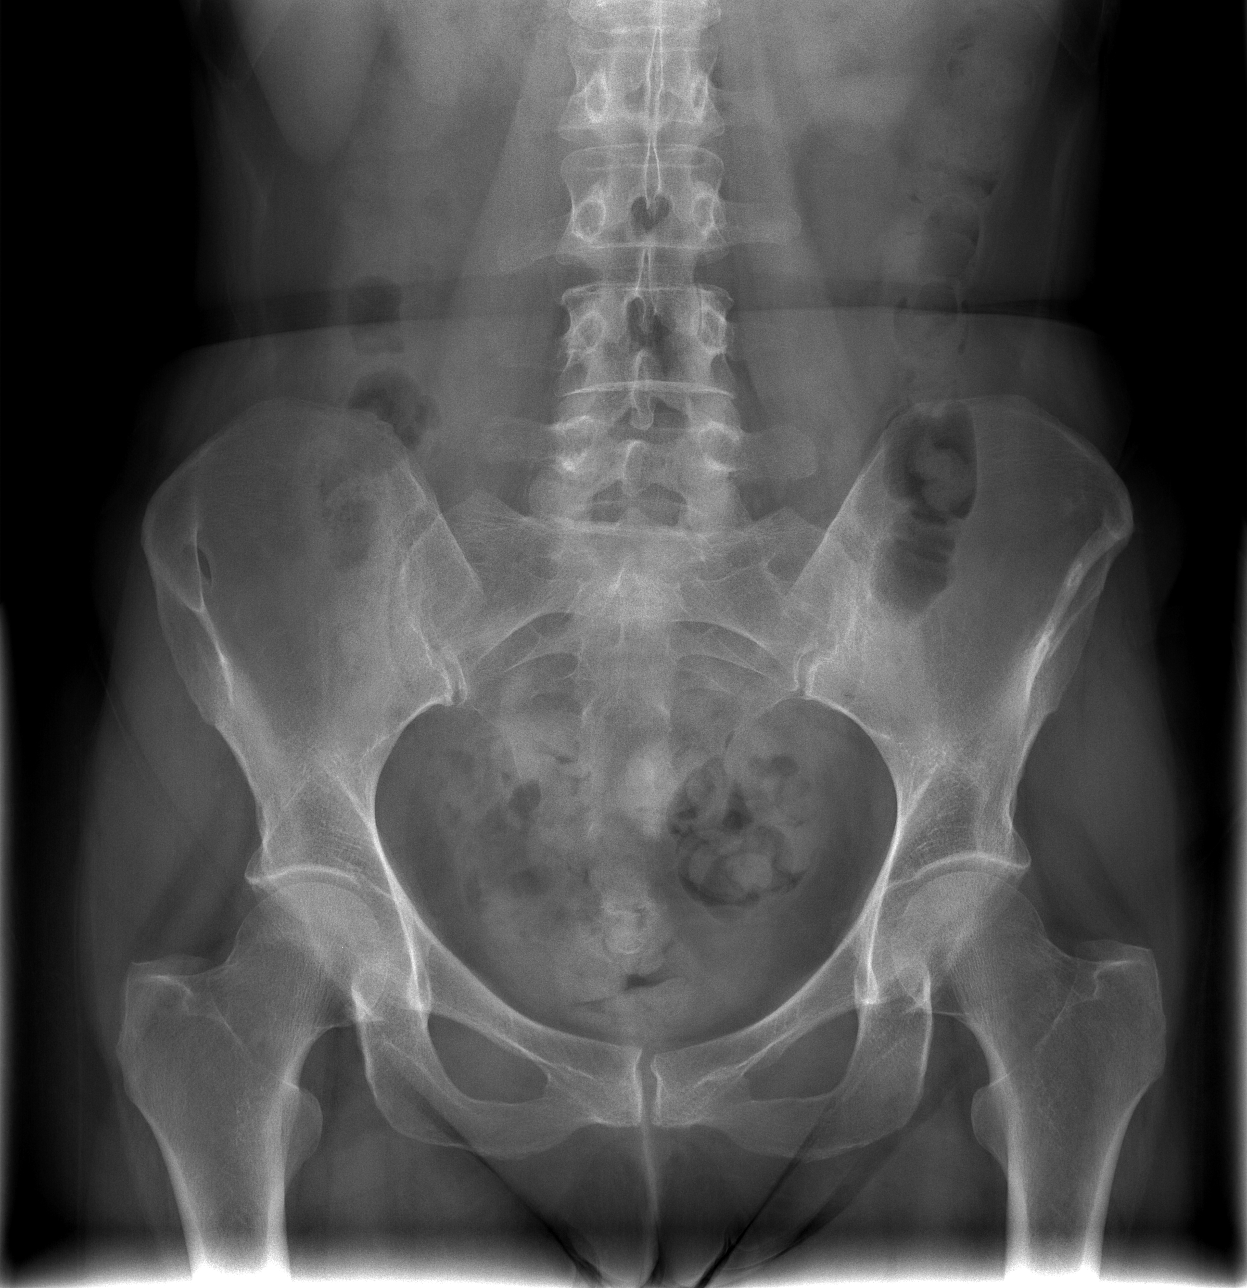

[3 of 3 positions shown; findings below may reference images not displayed]

Upright view of the chest as well as supine and upright views of the abdomen were performed.  Chest shows stable COPD without infiltrate, edema, or effusions.  Stable heart size.  Abdominal films show nonobstructive bowel gas pattern.  Clips are present from prior cholecystectomy.  No free air or abnormal calcification.  Visualized bony structures are unremarkable.
IMPRESSION: No active disease.  Stable COPD in the chest.

## 2007-03-11 IMAGING — CR DG CHEST 1V PORT
1 series · 1 of 1 positions shown · non-contrast
Comparison: 09/16/04.

CLINICAL DATA: Chest pain.  Asthma. 
 PORTABLE CHEST ([DATE] HOURS):

[view not recorded]
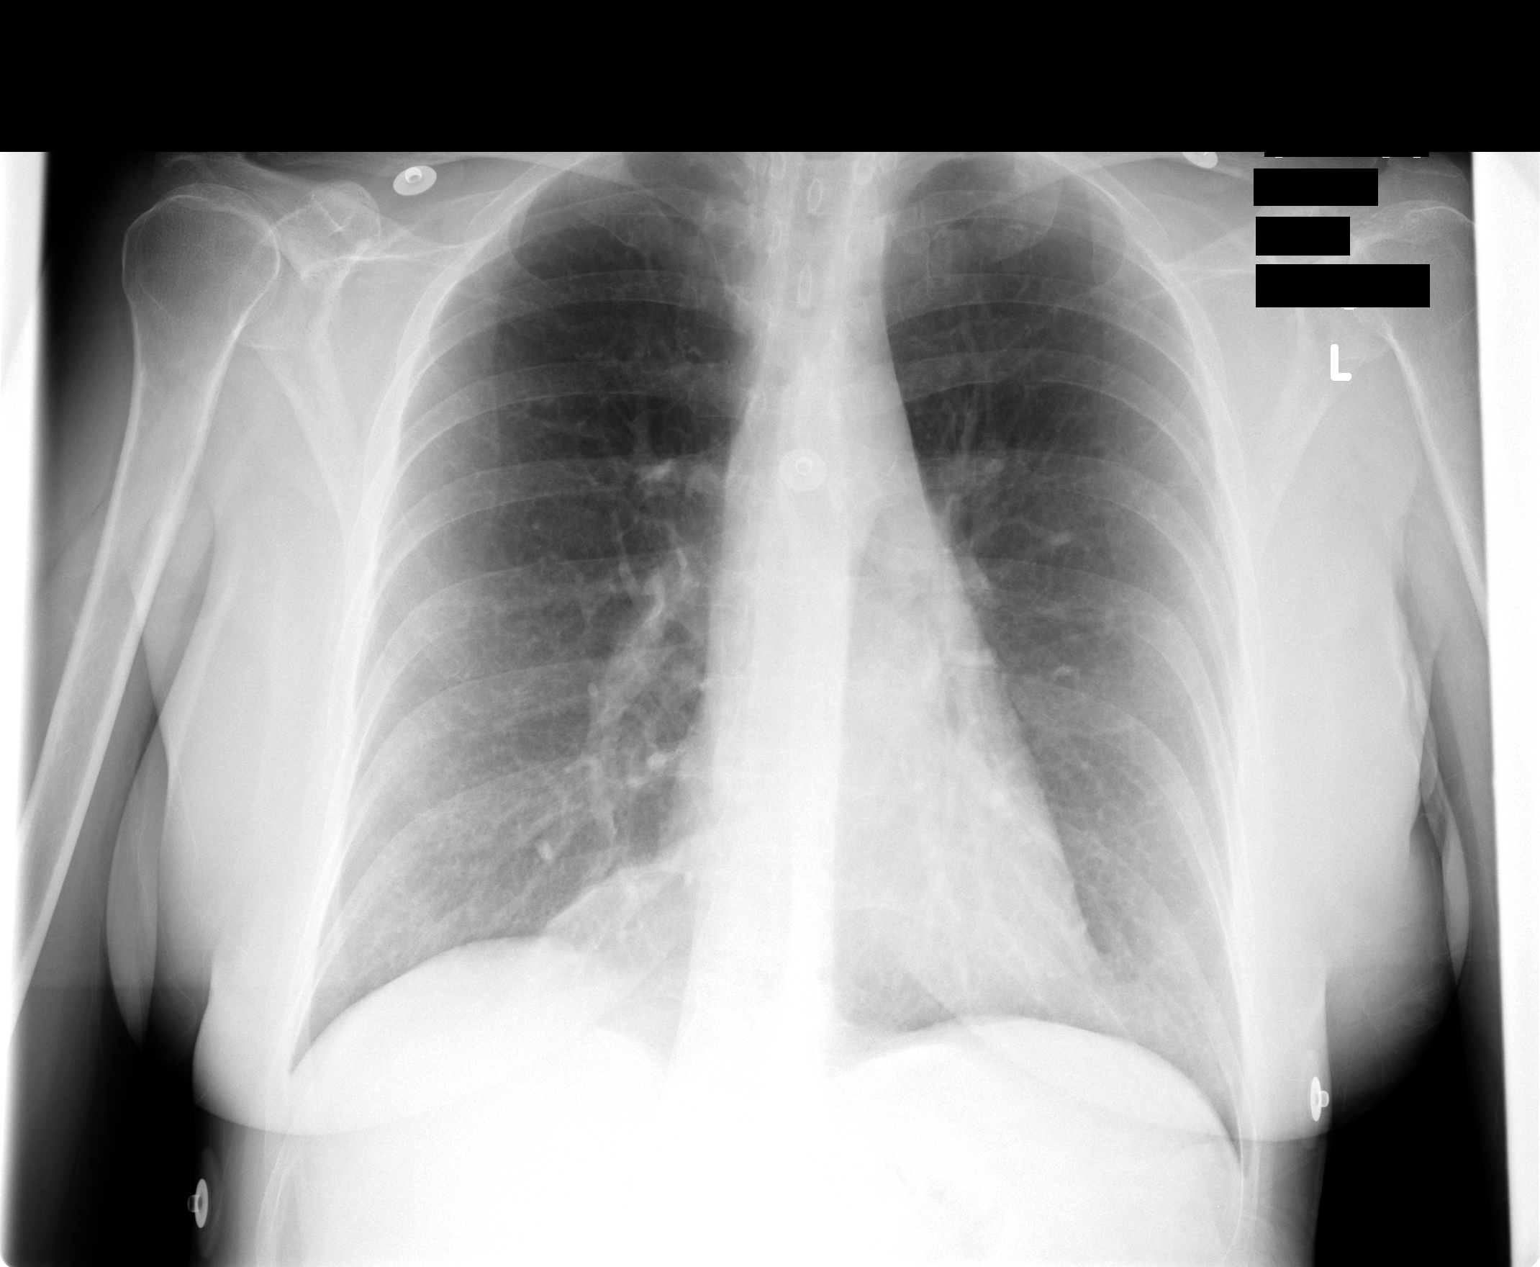

[1 of 1 positions shown; findings below may reference images not displayed]

The cardiomediastinal contours are stable.   The lungs remain hyperinflated but clear.  There is no pleural effusion or pneumothorax.
IMPRESSION: Stable exam with chronic obstructive pulmonary disease.  No acute findings.

## 2007-03-16 ENCOUNTER — Emergency Department (HOSPITAL_COMMUNITY): Admission: EM | Admit: 2007-03-16 | Discharge: 2007-03-17 | Payer: Self-pay | Admitting: Emergency Medicine

## 2007-04-02 ENCOUNTER — Emergency Department (HOSPITAL_COMMUNITY): Admission: EM | Admit: 2007-04-02 | Discharge: 2007-04-02 | Payer: Self-pay | Admitting: Emergency Medicine

## 2007-04-06 IMAGING — CR DG HIP (WITH OR WITHOUT PELVIS) 2-3V*L*
3 series · 3 of 3 positions shown · non-contrast
Comparison: none

CLINICAL DATA: Fall with left arm and left hip pain. 
 LEFT HIP - THREE VIEW:
 Frontal pelvis with AP and frogleg lateral views of the left hip.  SI joints are unremarkable.  Sacrum has normal features.

[t pelvis a.p.]
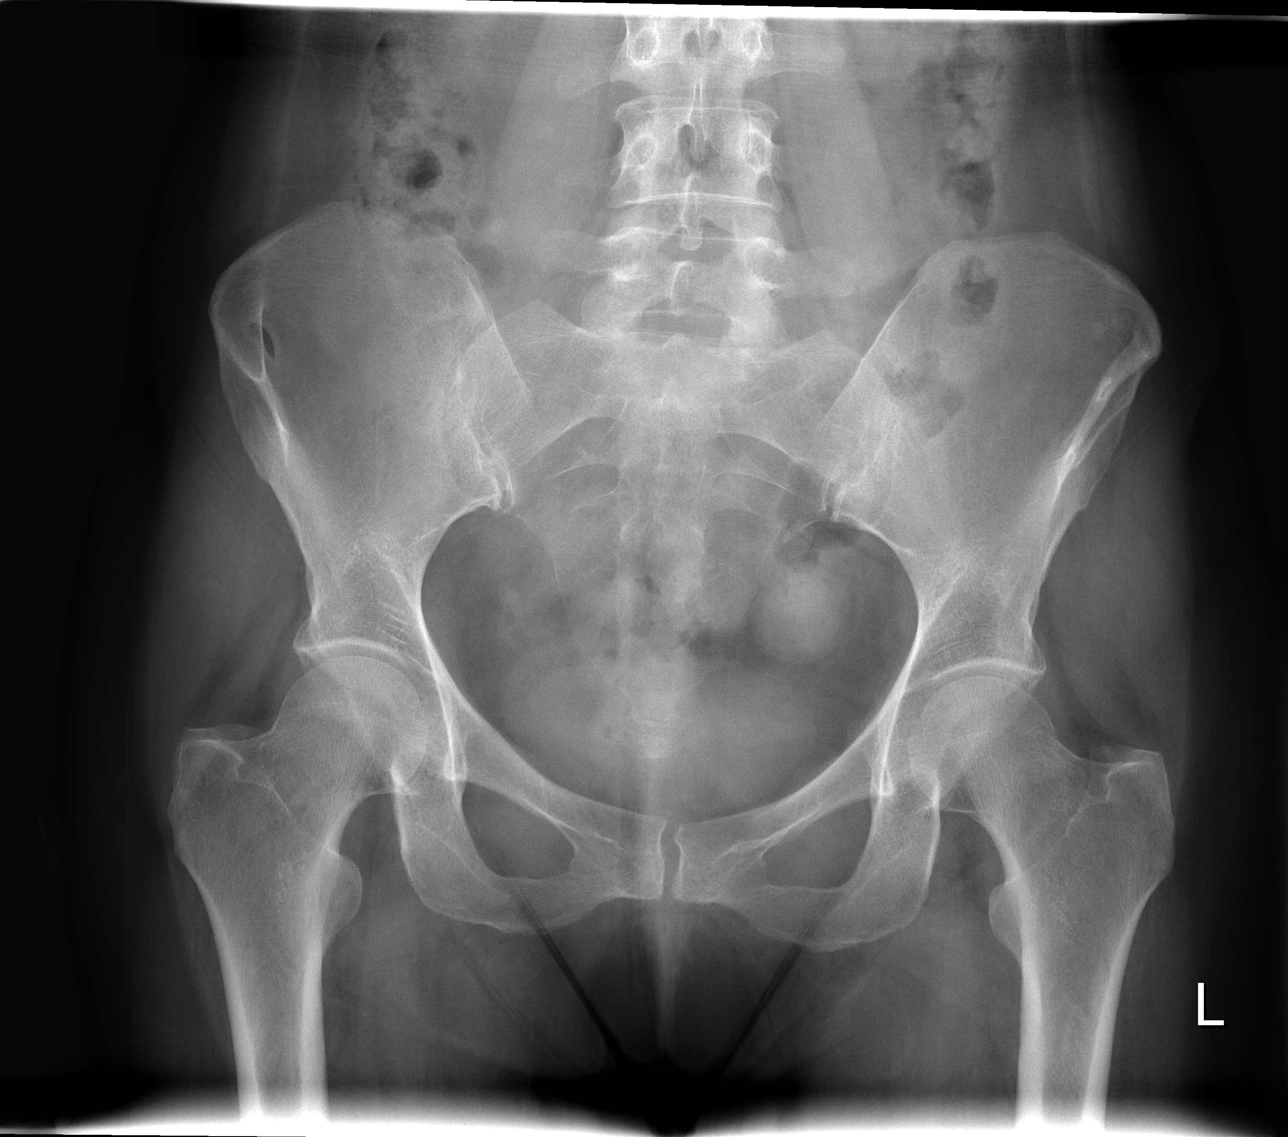

[t hip ap left]
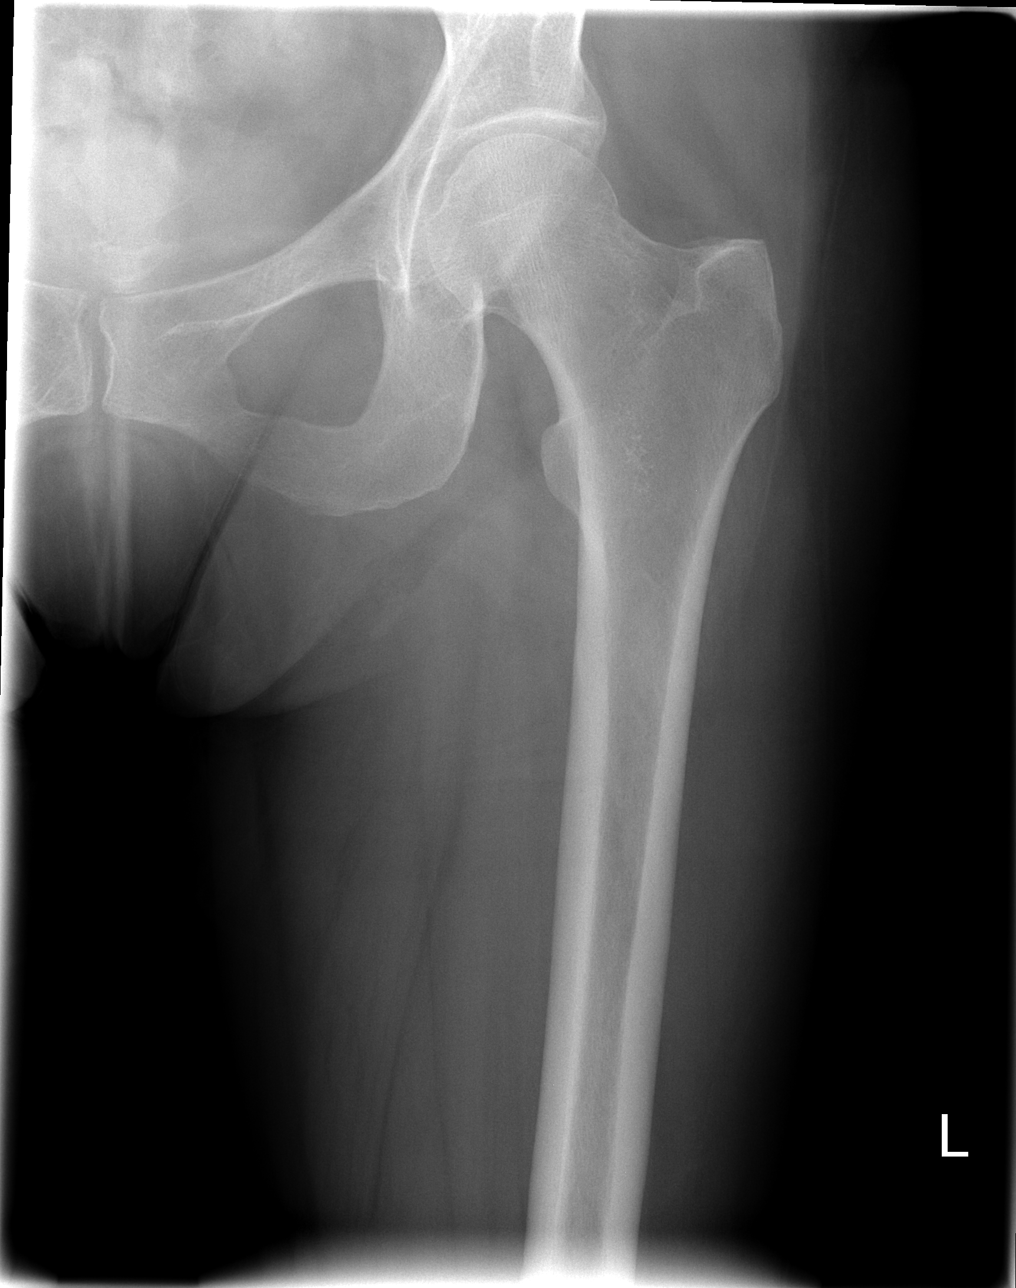

[t hip frog leg left]
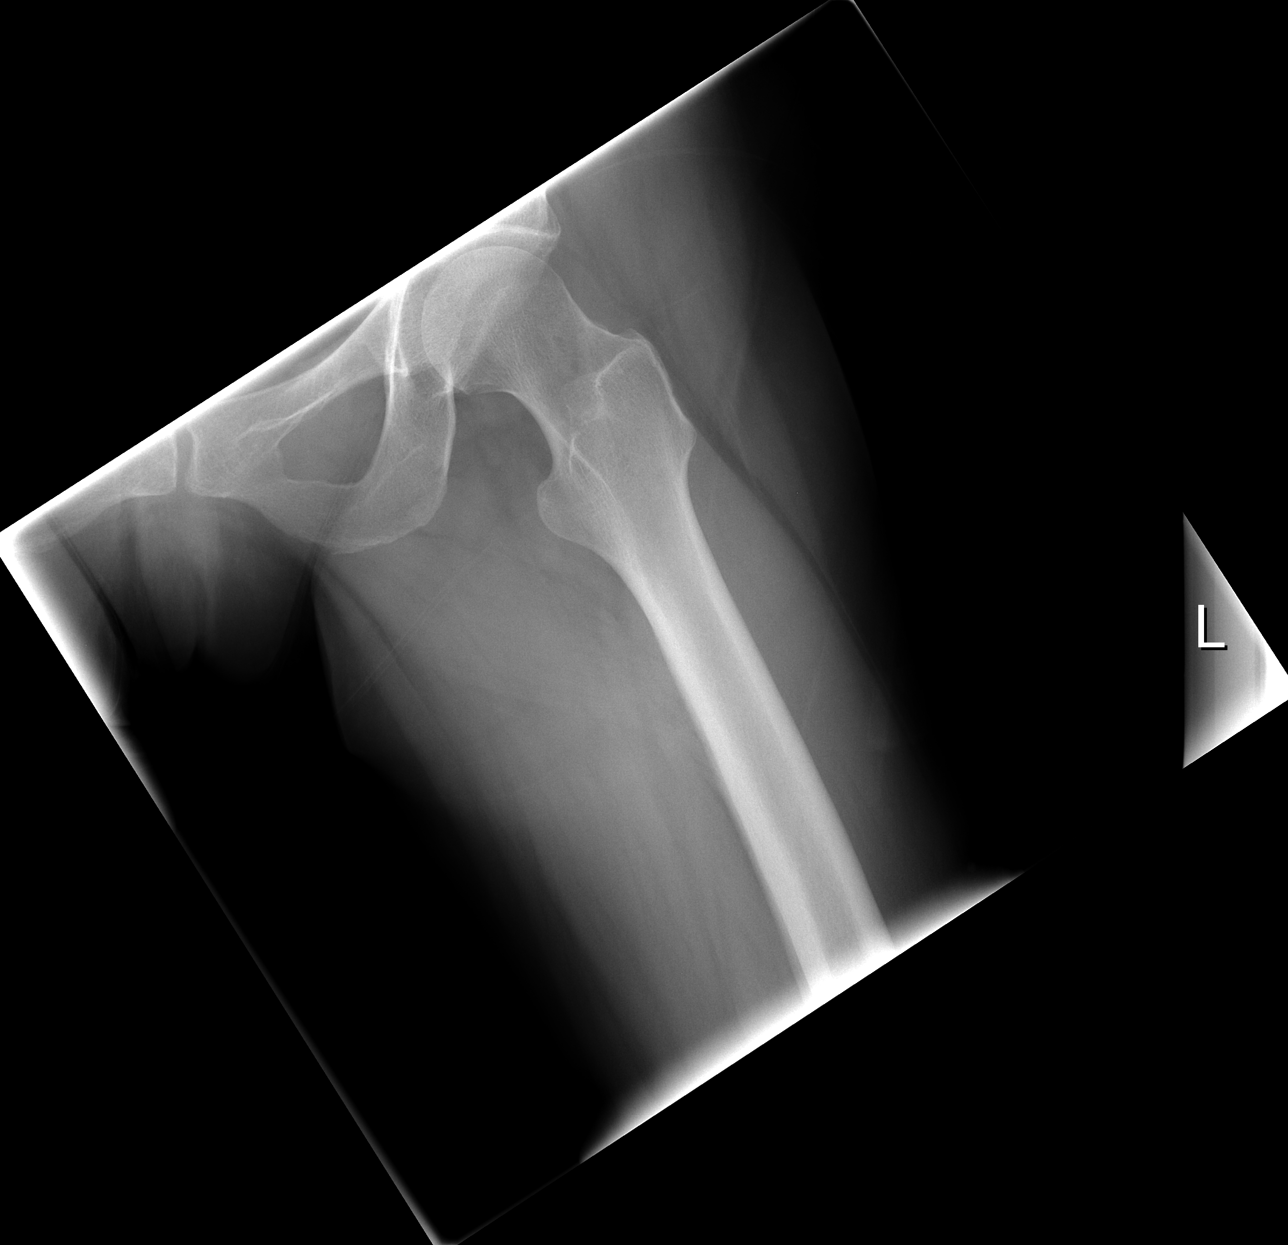

[3 of 3 positions shown; findings below may reference images not displayed]

IMPRESSION: No acute bony abnormality.  
 LEFT ELBOW - FOUR VIEW: 
 There is no evidence of fracture, dislocation, or joint effusion.  There is no evidence of arthropathy or other focal bone abnormality.  Soft tissues are unremarkable.
IMPRESSION: Negative.
 LEFT HUMERUS - TWO VIEW:
 There is no evidence of fracture or other focal bone lesions.  Soft tissues are unremarkable.
IMPRESSION: Negative.

## 2007-04-06 IMAGING — CR DG HUMERUS 2V *L*
2 series · 2 of 2 positions shown · non-contrast
Comparison: none

CLINICAL DATA: Fall with left arm and left hip pain. 
 LEFT HIP - THREE VIEW:
 Frontal pelvis with AP and frogleg lateral views of the left hip.  SI joints are unremarkable.  Sacrum has normal features.

[t humerus ap left *]
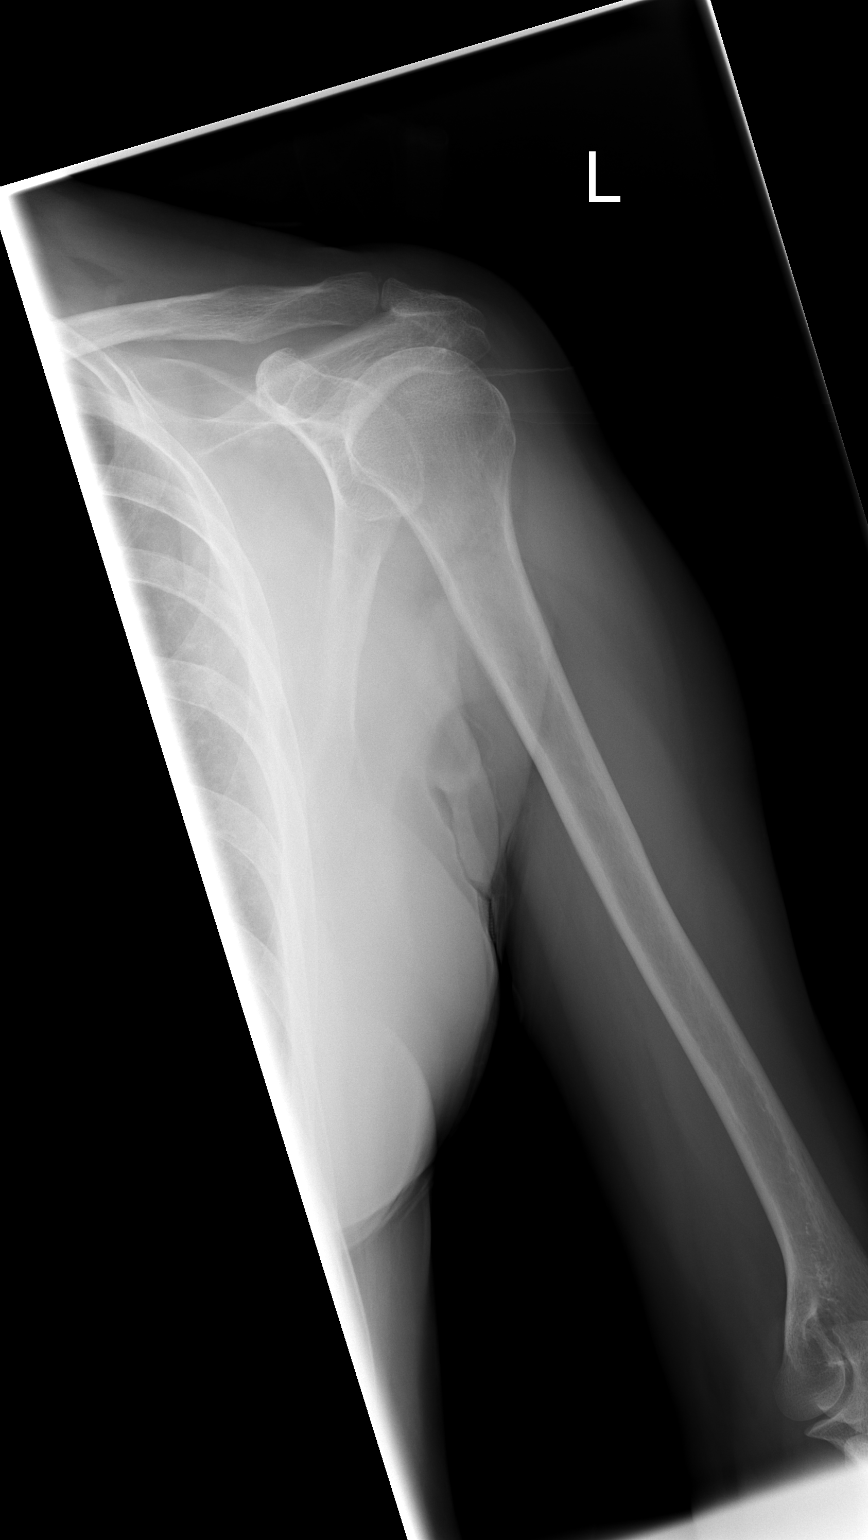

[t humerus lat left *]
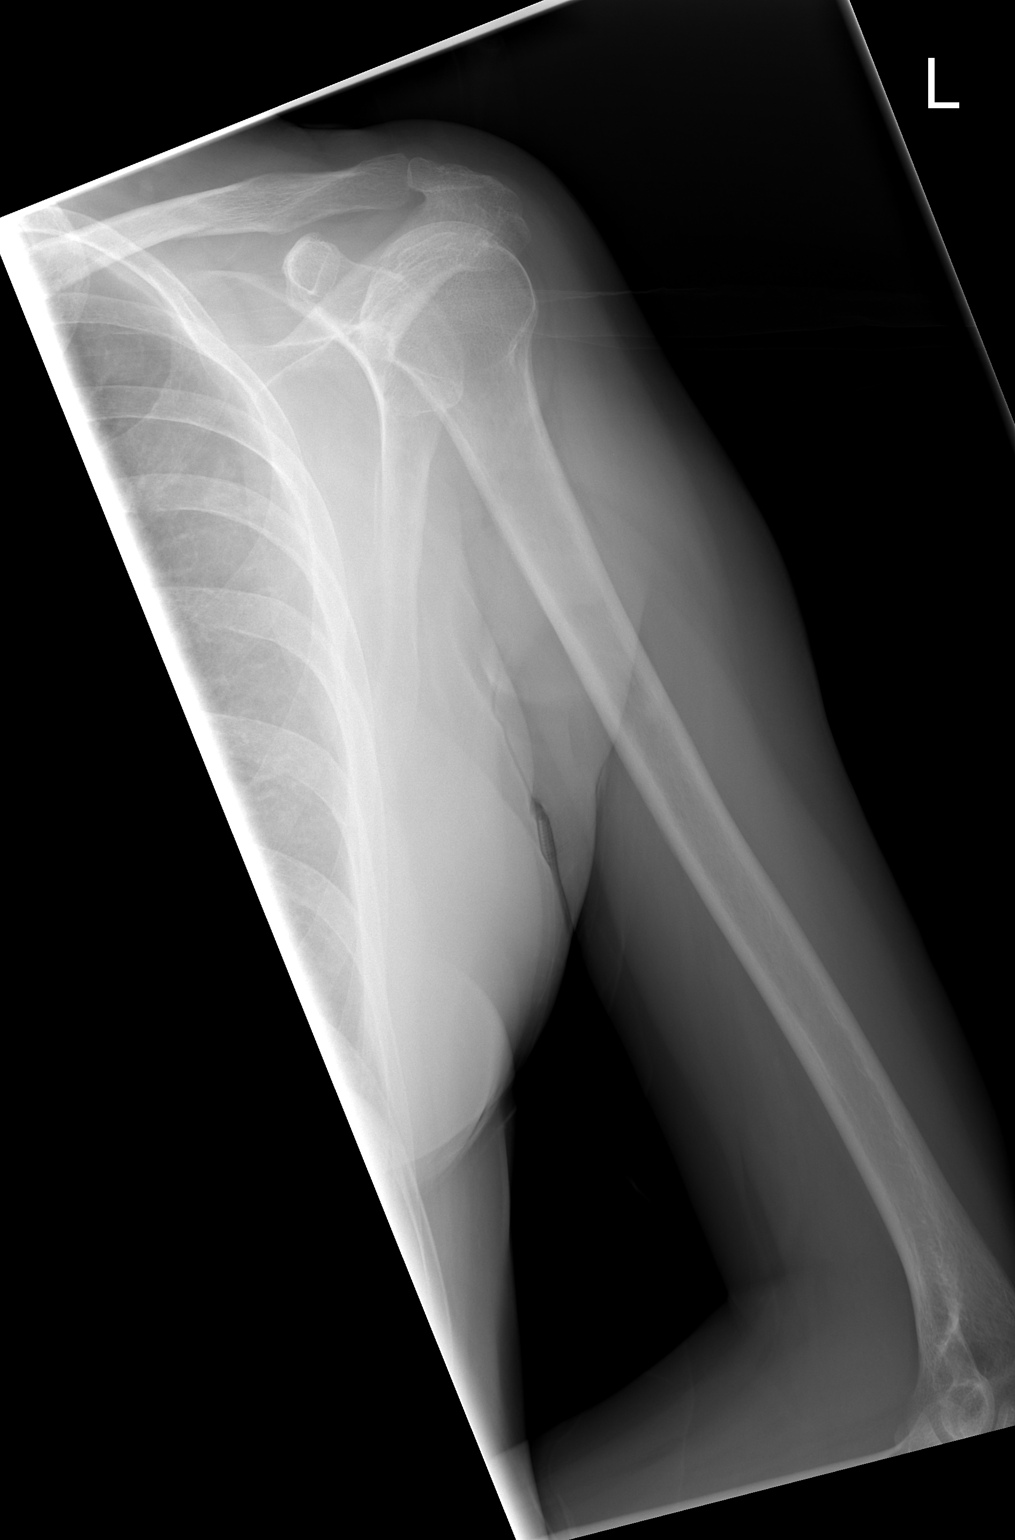

[2 of 2 positions shown; findings below may reference images not displayed]

IMPRESSION: No acute bony abnormality.  
 LEFT ELBOW - FOUR VIEW: 
 There is no evidence of fracture, dislocation, or joint effusion.  There is no evidence of arthropathy or other focal bone abnormality.  Soft tissues are unremarkable.
IMPRESSION: Negative.
 LEFT HUMERUS - TWO VIEW:
 There is no evidence of fracture or other focal bone lesions.  Soft tissues are unremarkable.
IMPRESSION: Negative.

## 2007-04-06 IMAGING — CR DG ELBOW COMPLETE 3+V*L*
4 series · 4 of 4 positions shown · non-contrast
Comparison: none

CLINICAL DATA: Fall with left arm and left hip pain. 
 LEFT HIP - THREE VIEW:
 Frontal pelvis with AP and frogleg lateral views of the left hip.  SI joints are unremarkable.  Sacrum has normal features.

[x elbow joint ap left *]
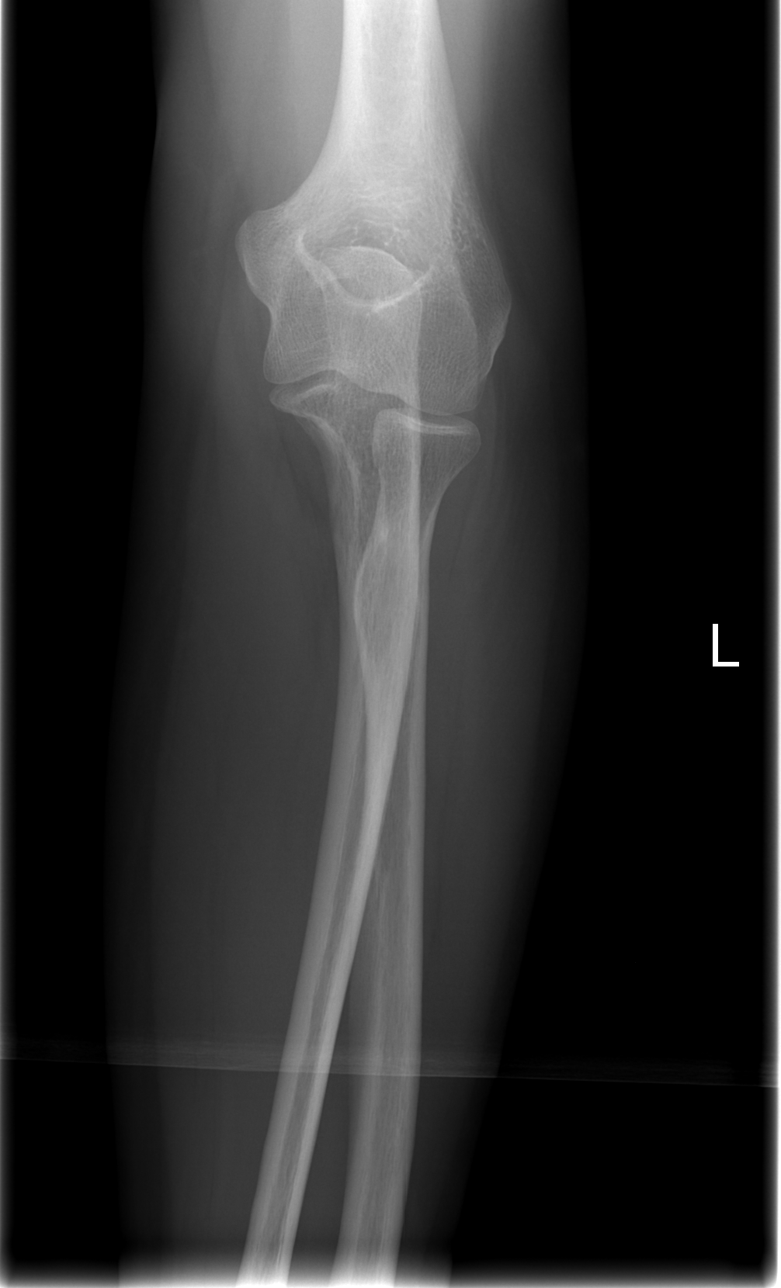

[x elbow joint obl. left *]
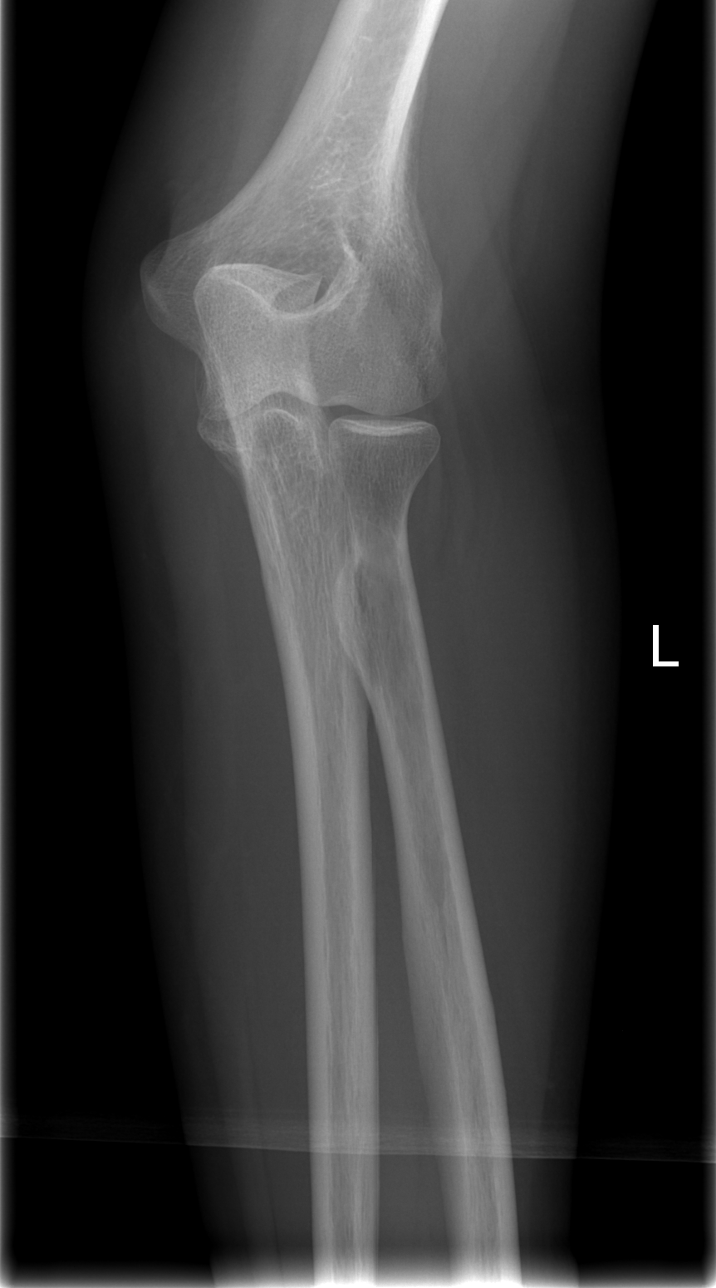

[x elbow joint lat left * (1 of 2)]
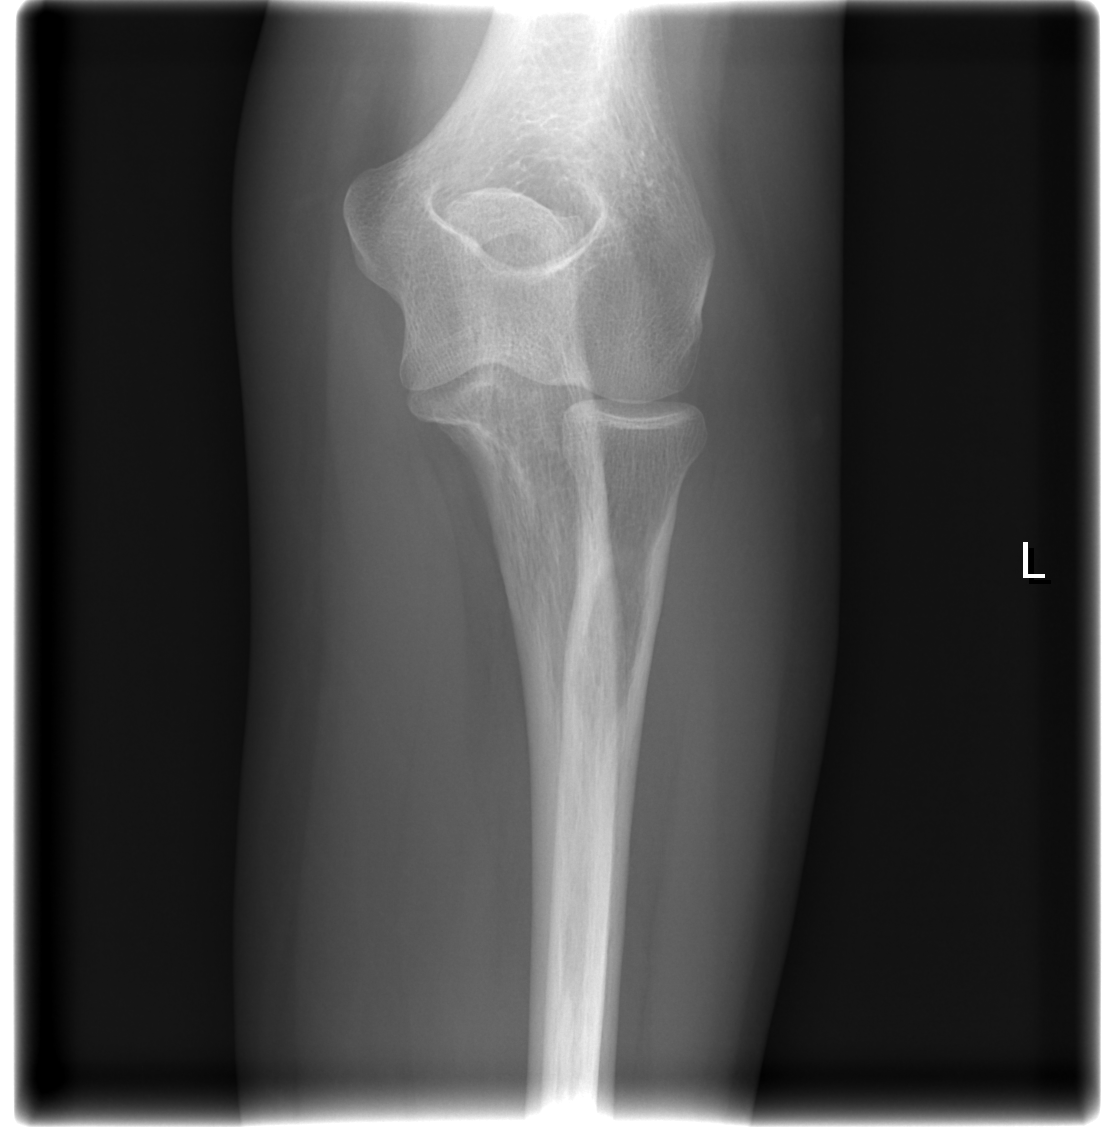

[x elbow joint lat left * (2 of 2)]
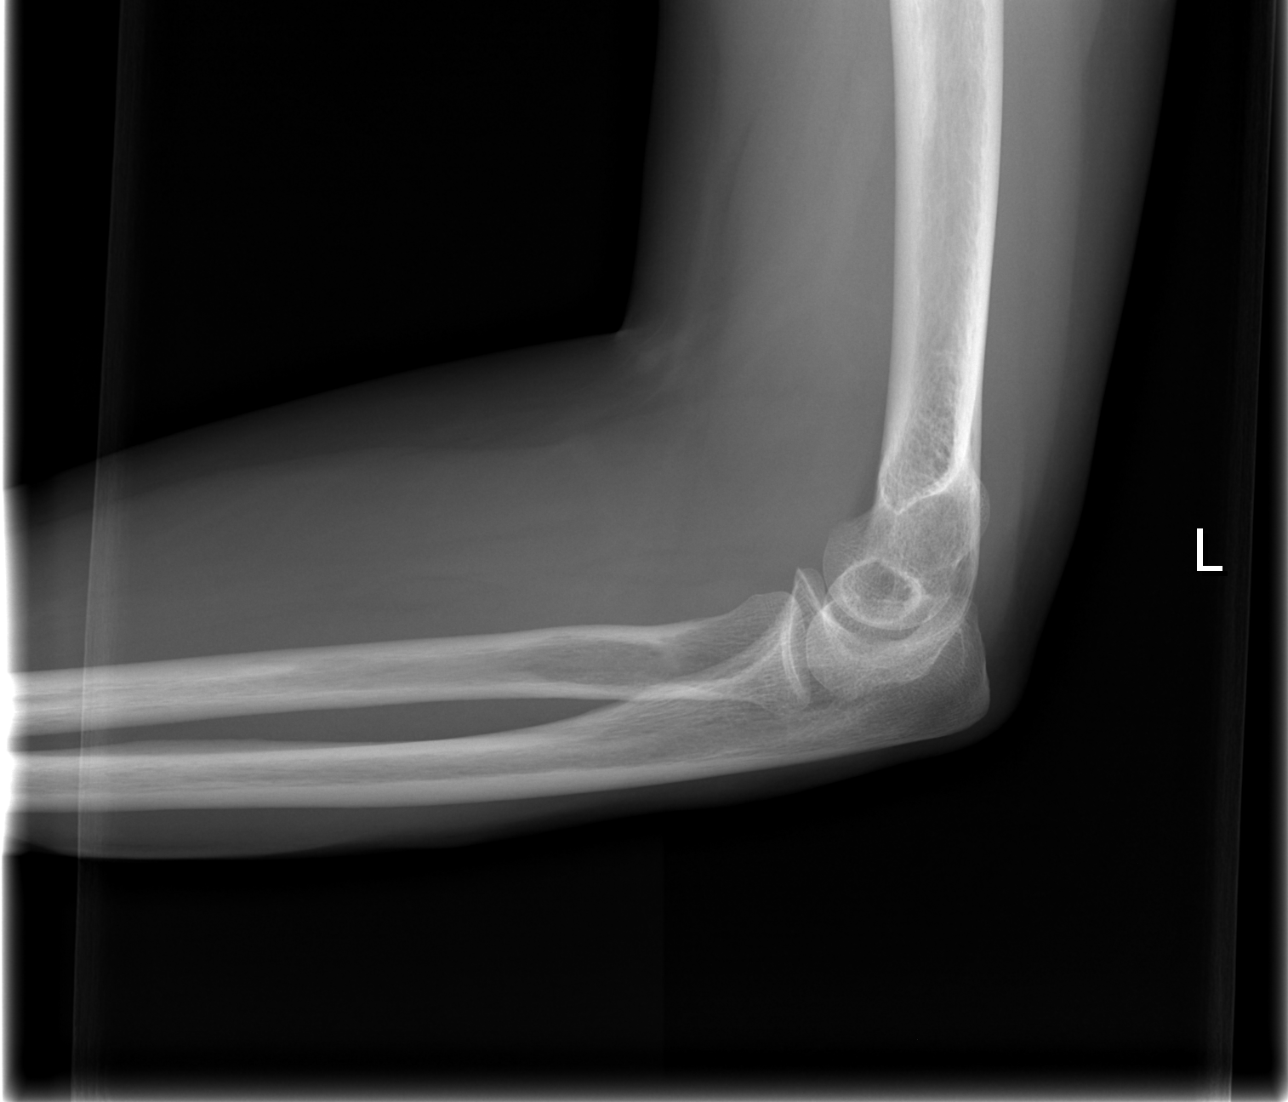

[4 of 4 positions shown; findings below may reference images not displayed]

IMPRESSION: No acute bony abnormality.  
 LEFT ELBOW - FOUR VIEW: 
 There is no evidence of fracture, dislocation, or joint effusion.  There is no evidence of arthropathy or other focal bone abnormality.  Soft tissues are unremarkable.
IMPRESSION: Negative.
 LEFT HUMERUS - TWO VIEW:
 There is no evidence of fracture or other focal bone lesions.  Soft tissues are unremarkable.
IMPRESSION: Negative.

## 2007-06-20 IMAGING — CR DG CHEST 2V
2 series · 2 of 2 positions shown · non-contrast
Comparison: 01/15/05.

CLINICAL DATA: Cough and fever for two days.  History of asthma and emphysema.  
 CHEST - 2 VIEWS:

[w chest pa]
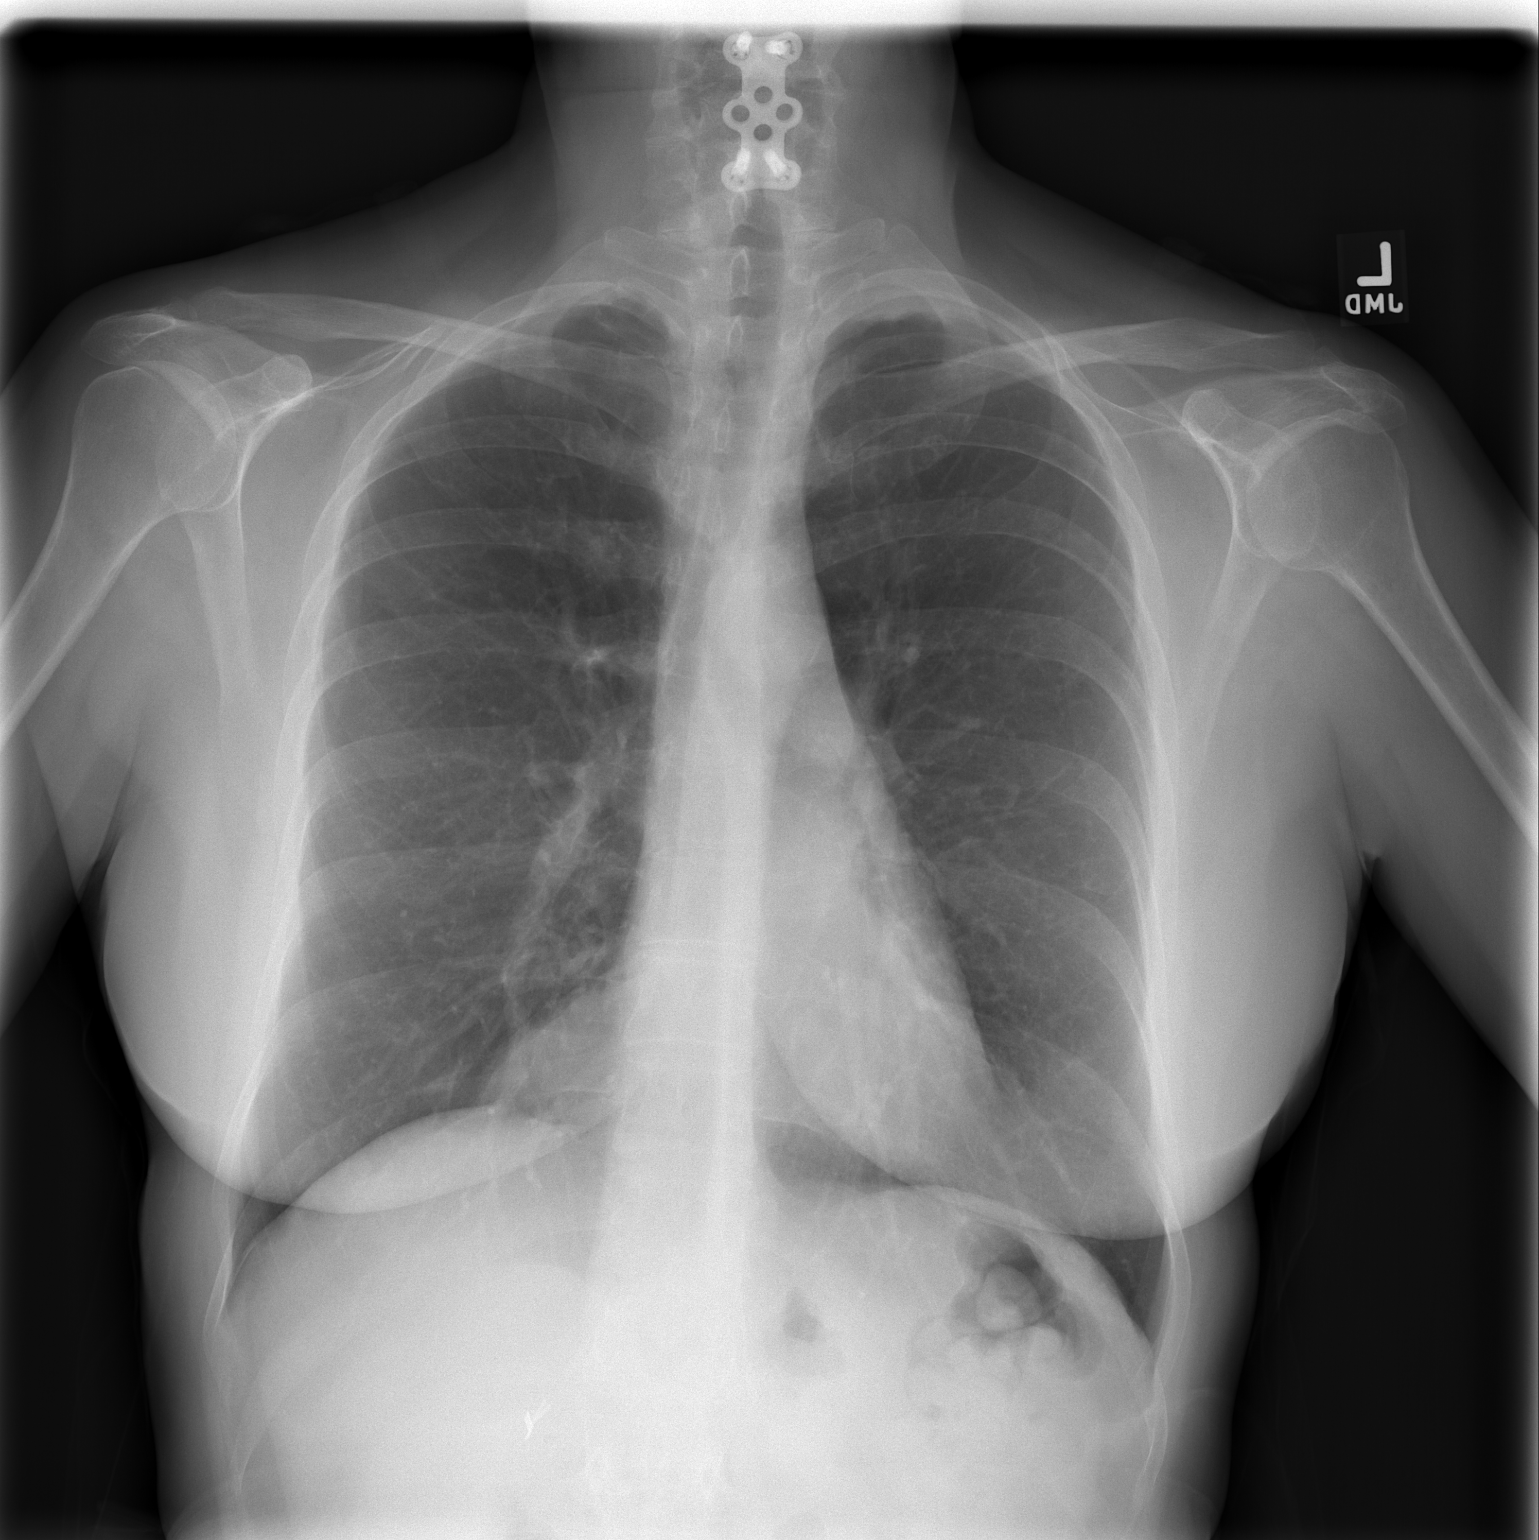

[w chest lat]
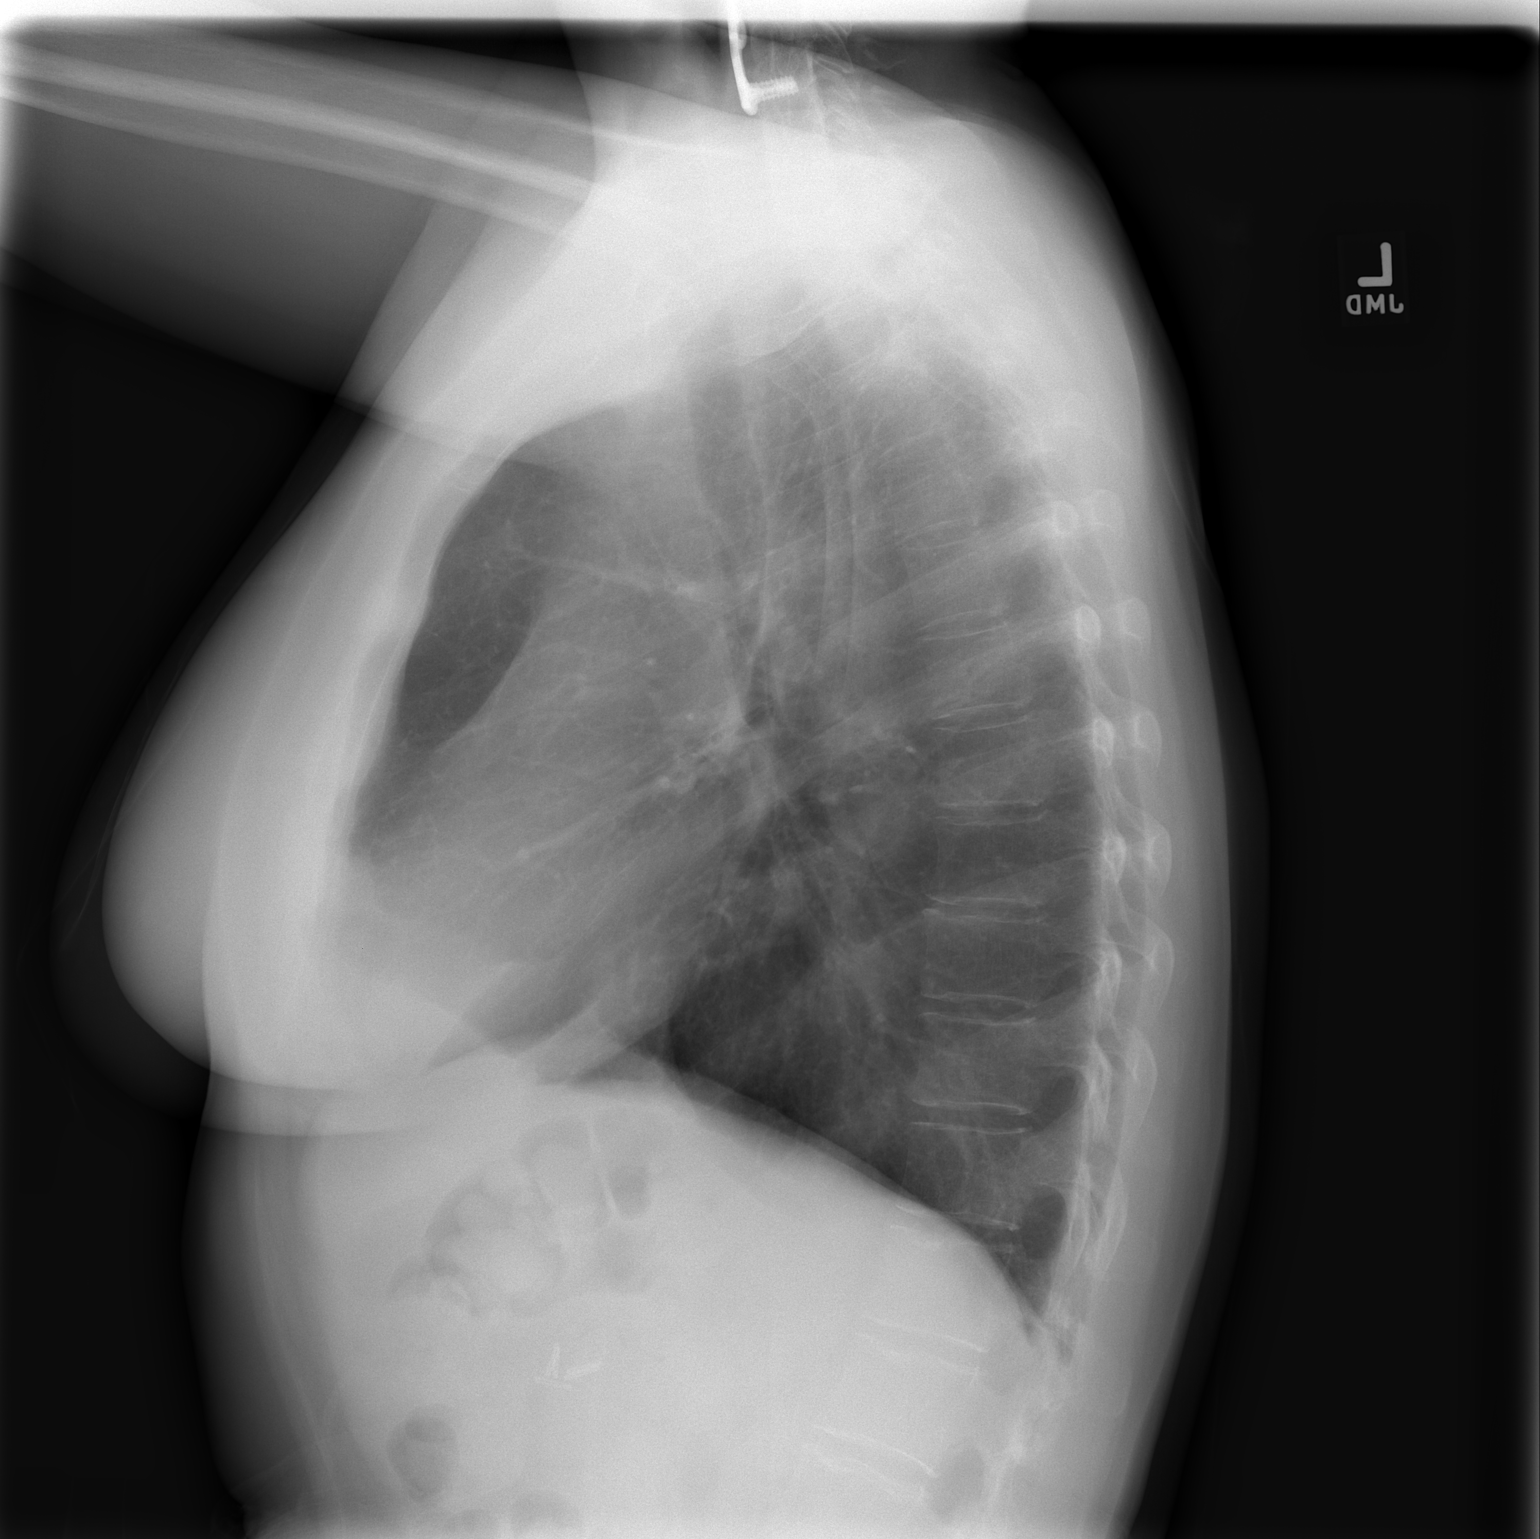

[2 of 2 positions shown; findings below may reference images not displayed]

FINDINGS: Increased lung volumes with flattening of the diaphragms consistent with COPD.  Heart size is normal.  There are no pleural effusions.  No focal lung opacities are identified.
IMPRESSION: 1.  COPD/emphysema.  
 2.  No active cardiopulmonary abnormalities.

## 2007-07-29 IMAGING — CR DG THORACIC SPINE 3V
3 series · 3 of 3 positions shown · non-contrast
Comparison: none

CLINICAL DATA: 48-year-old, with mid and lower back pain. 
 3-VIEW THORACIC SPINE SERIES:

[t t-spine a.p.]
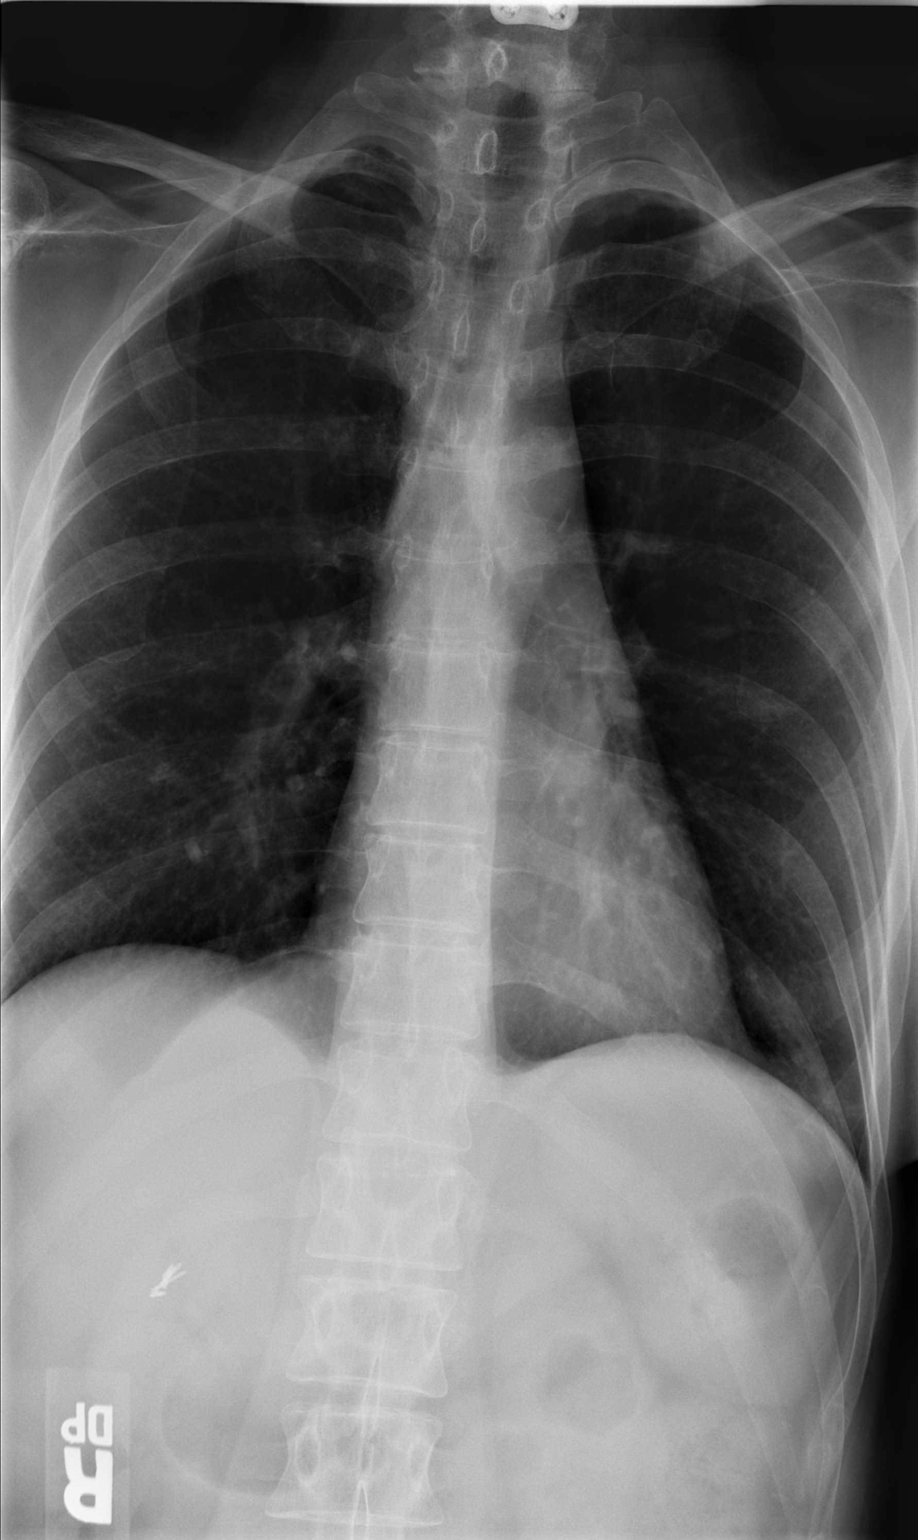

[t t-spine lat *]
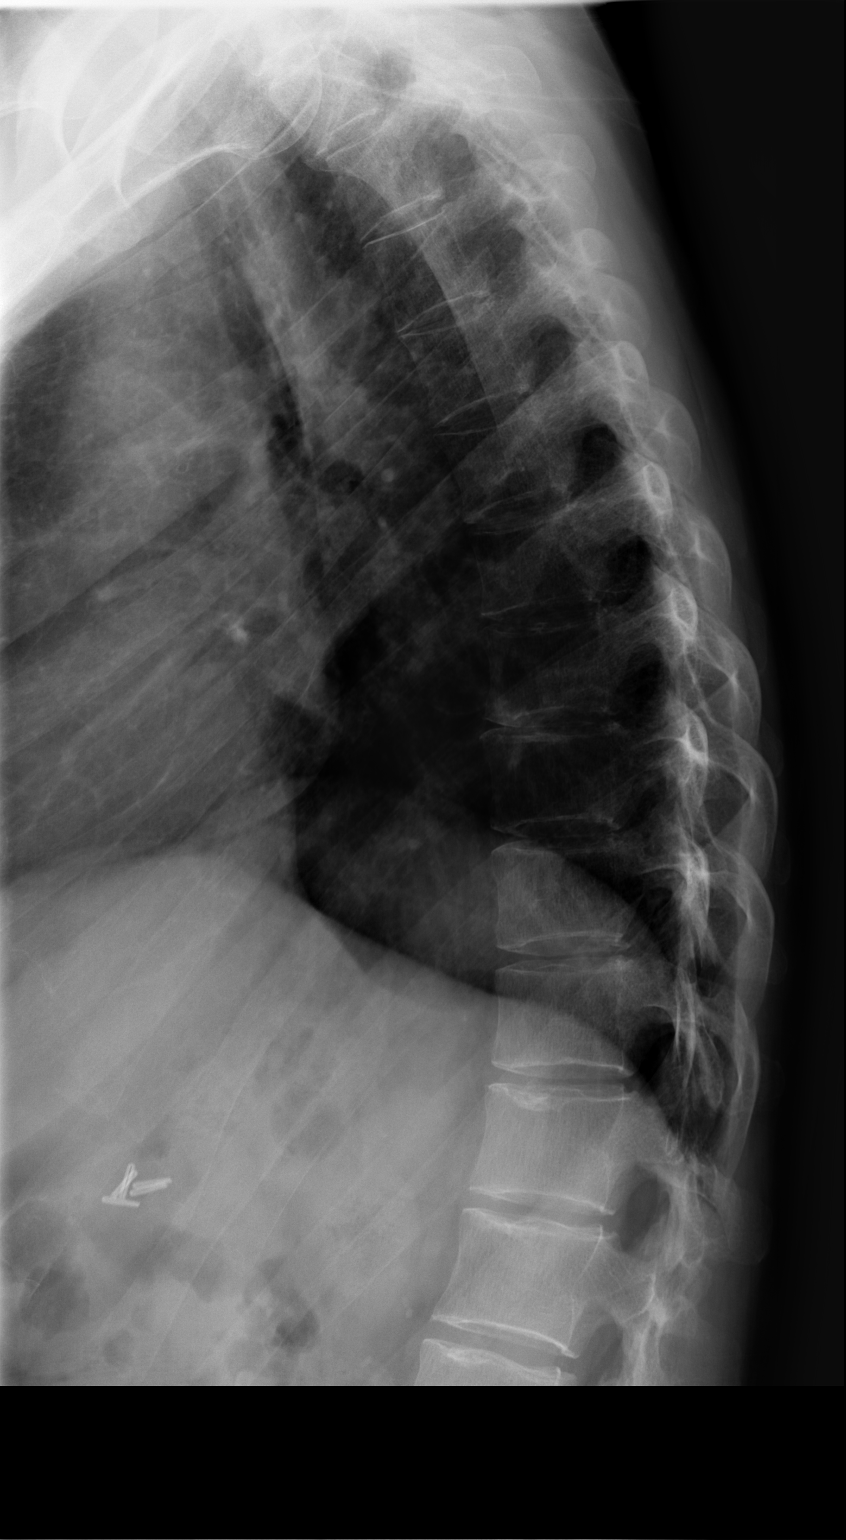

[t swimmers]
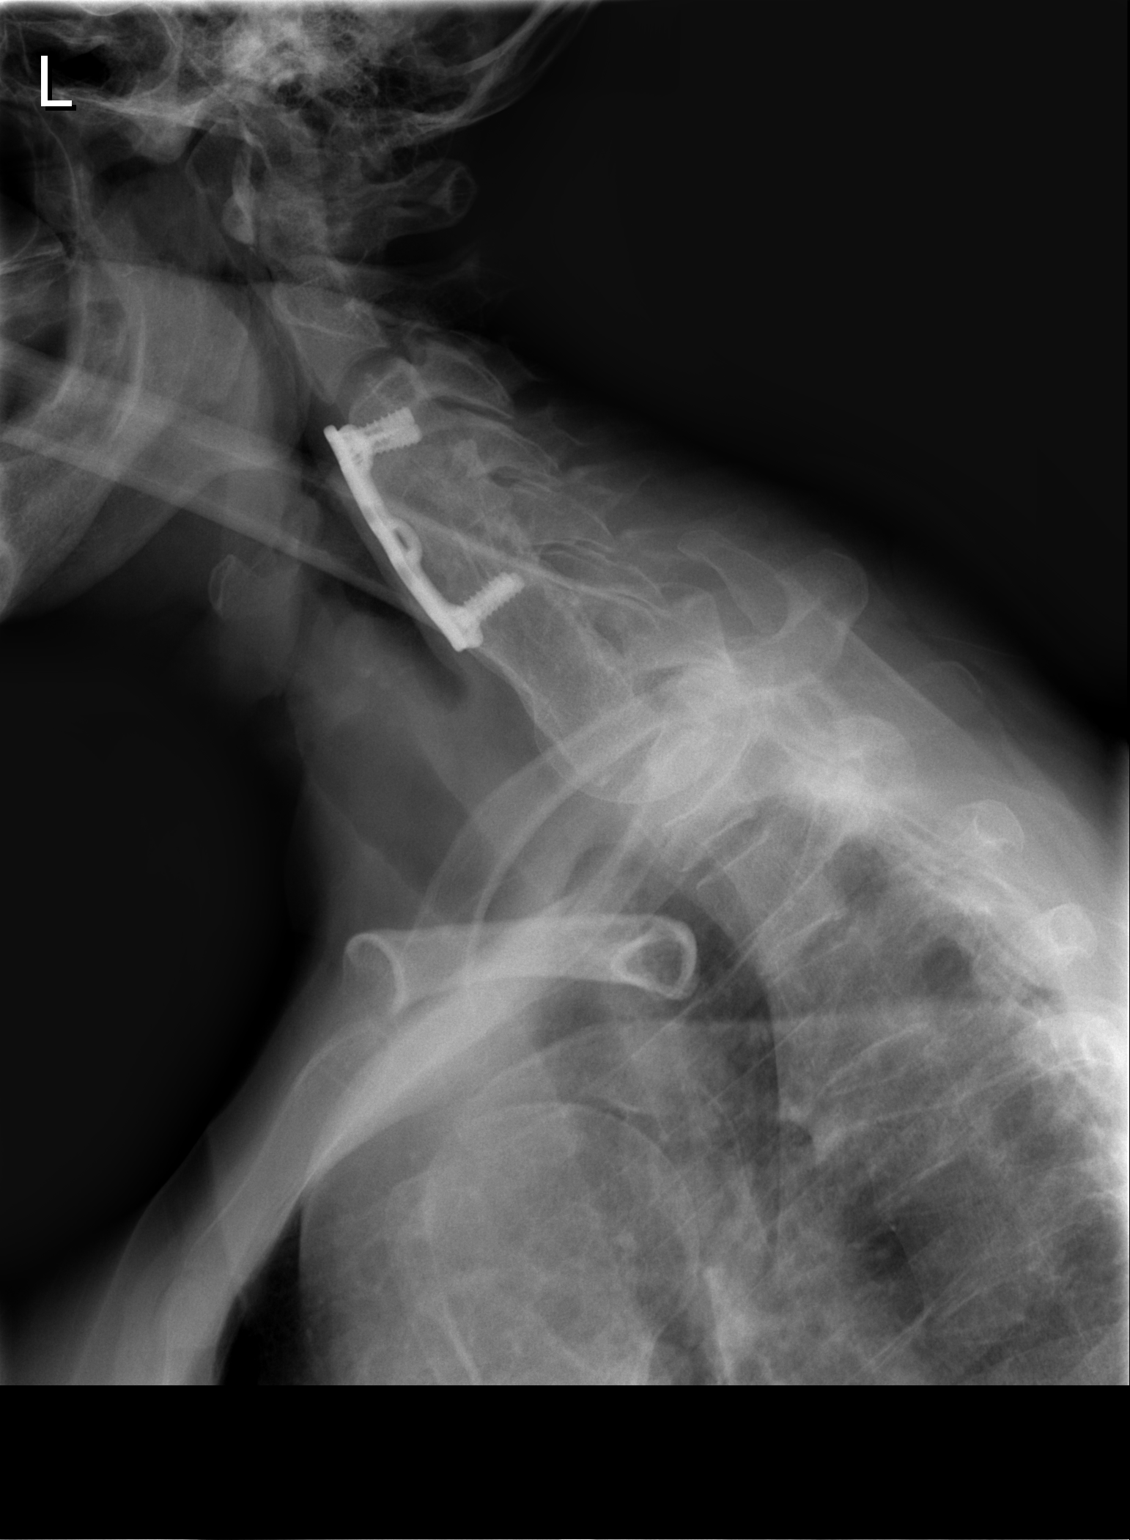

[3 of 3 positions shown; findings below may reference images not displayed]

FINDINGS: Normal alignment.  Disc spaces and vertebral bodies are maintained.  Minimal degenerative changes.  No abnormal paraspinal soft tissue swelling.  Extensive cervical fusion is noted.
IMPRESSION: No acute bony findings and normal alignment.  
 5-VIEW LUMBAR SPINE SERIES:
 Lateral film demonstrates normal overall alignment.  Disc spaces and vertebral bodies are maintained.  No acute bony findings or significant degenerative changes.  There are mild facet degenerative changes but no pars defects.  Visualized pelvic appears normal.
IMPRESSION: No acute bony findings or significant degenerative changes in the lumbar spine.

## 2007-07-29 IMAGING — CR DG LUMBAR SPINE COMPLETE 4+V
5 series · 5 of 5 positions shown · non-contrast
Comparison: none

CLINICAL DATA: 48-year-old, with mid and lower back pain. 
 3-VIEW THORACIC SPINE SERIES:

[t l-spine a.p.]
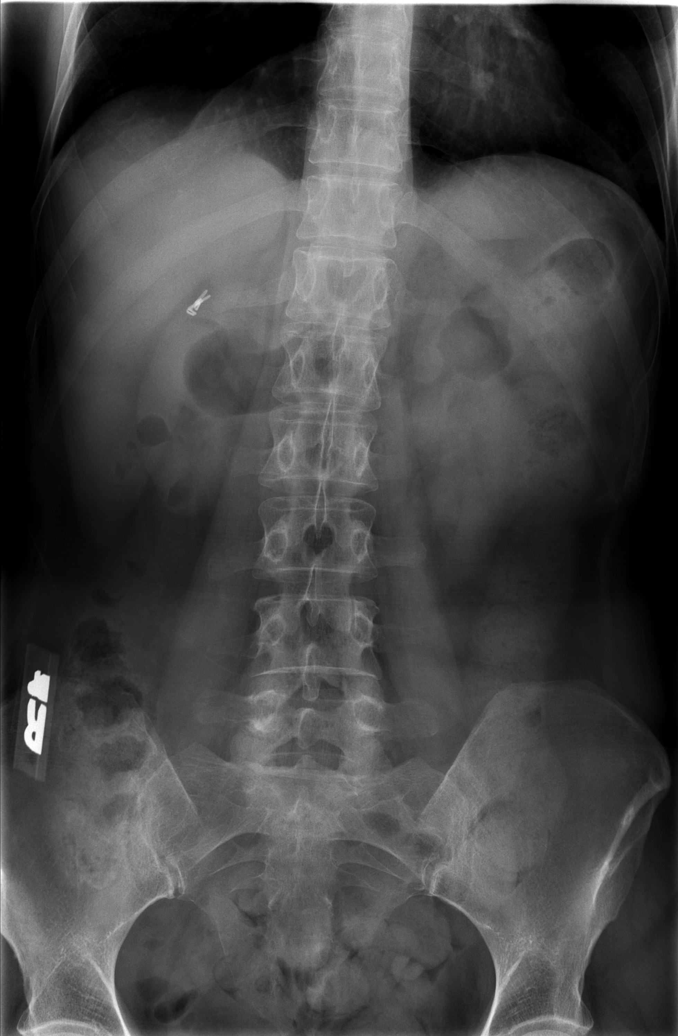

[t l-spine oblique exposure (1 of 2)]
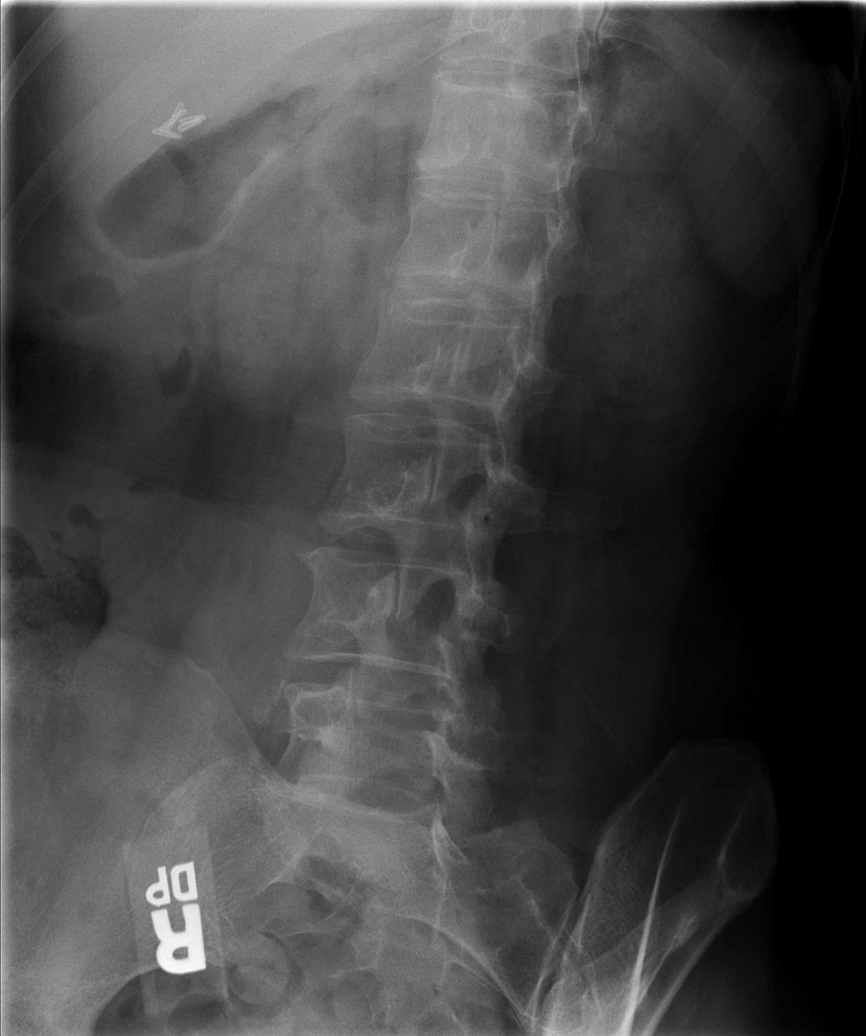

[t l-spine oblique exposure (2 of 2)]
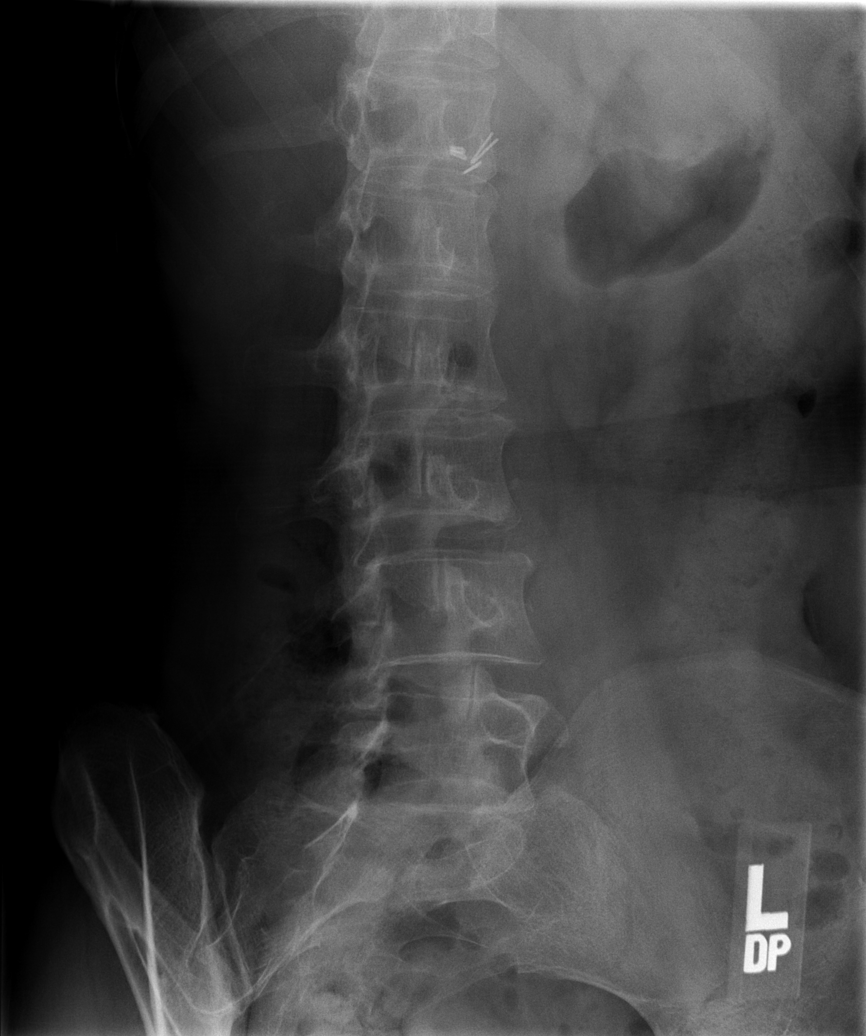

[t l-spine lat]
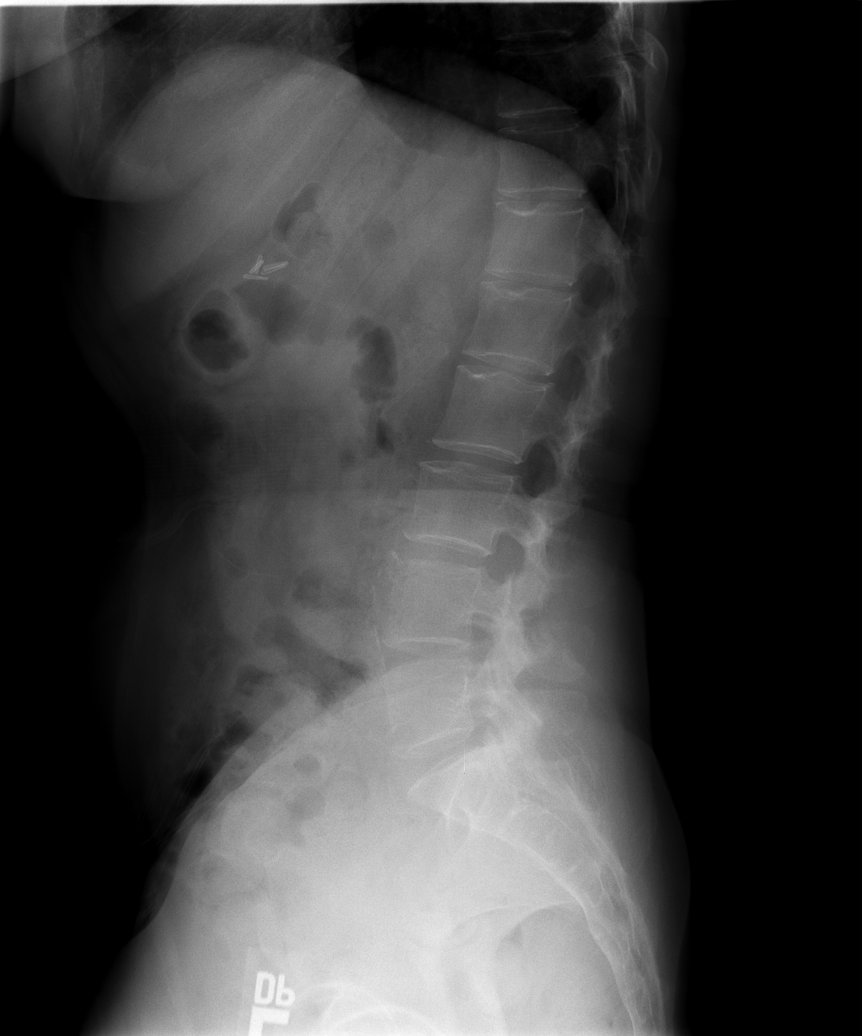

[t l-spine l5-s1 spot]
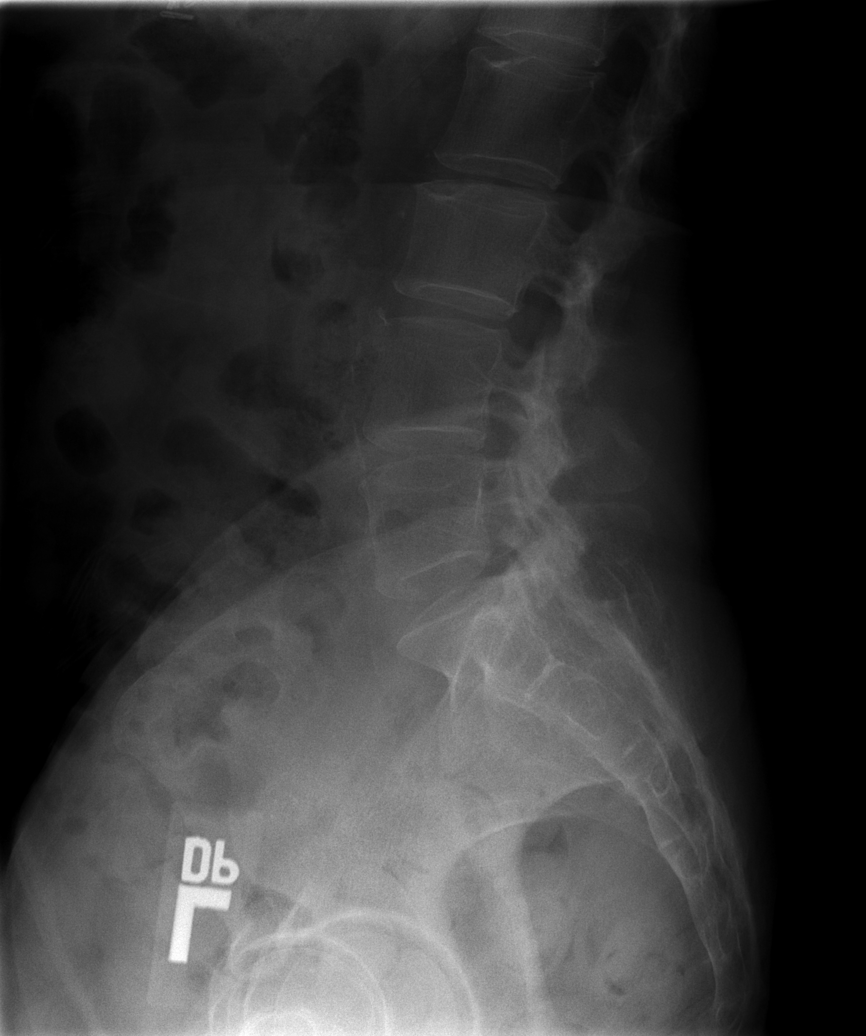

[5 of 5 positions shown; findings below may reference images not displayed]

FINDINGS: Normal alignment.  Disc spaces and vertebral bodies are maintained.  Minimal degenerative changes.  No abnormal paraspinal soft tissue swelling.  Extensive cervical fusion is noted.
IMPRESSION: No acute bony findings and normal alignment.  
 5-VIEW LUMBAR SPINE SERIES:
 Lateral film demonstrates normal overall alignment.  Disc spaces and vertebral bodies are maintained.  No acute bony findings or significant degenerative changes.  There are mild facet degenerative changes but no pars defects.  Visualized pelvic appears normal.
IMPRESSION: No acute bony findings or significant degenerative changes in the lumbar spine.

## 2007-08-17 ENCOUNTER — Emergency Department (HOSPITAL_COMMUNITY): Admission: EM | Admit: 2007-08-17 | Discharge: 2007-08-17 | Payer: Self-pay | Admitting: Emergency Medicine

## 2007-08-23 ENCOUNTER — Emergency Department (HOSPITAL_COMMUNITY): Admission: EM | Admit: 2007-08-23 | Discharge: 2007-08-23 | Payer: Self-pay | Admitting: Emergency Medicine

## 2007-09-02 ENCOUNTER — Emergency Department (HOSPITAL_COMMUNITY): Admission: EM | Admit: 2007-09-02 | Discharge: 2007-09-02 | Payer: Self-pay | Admitting: *Deleted

## 2007-09-22 IMAGING — CR DG CHEST 2V
2 series · 2 of 2 positions shown · non-contrast
Comparison: 05/04/05.

CLINICAL DATA: Chest pain, cough, congestion, fever, emphysema.
 CHEST, TWO VIEWS:

[view not recorded (1 of 2)]
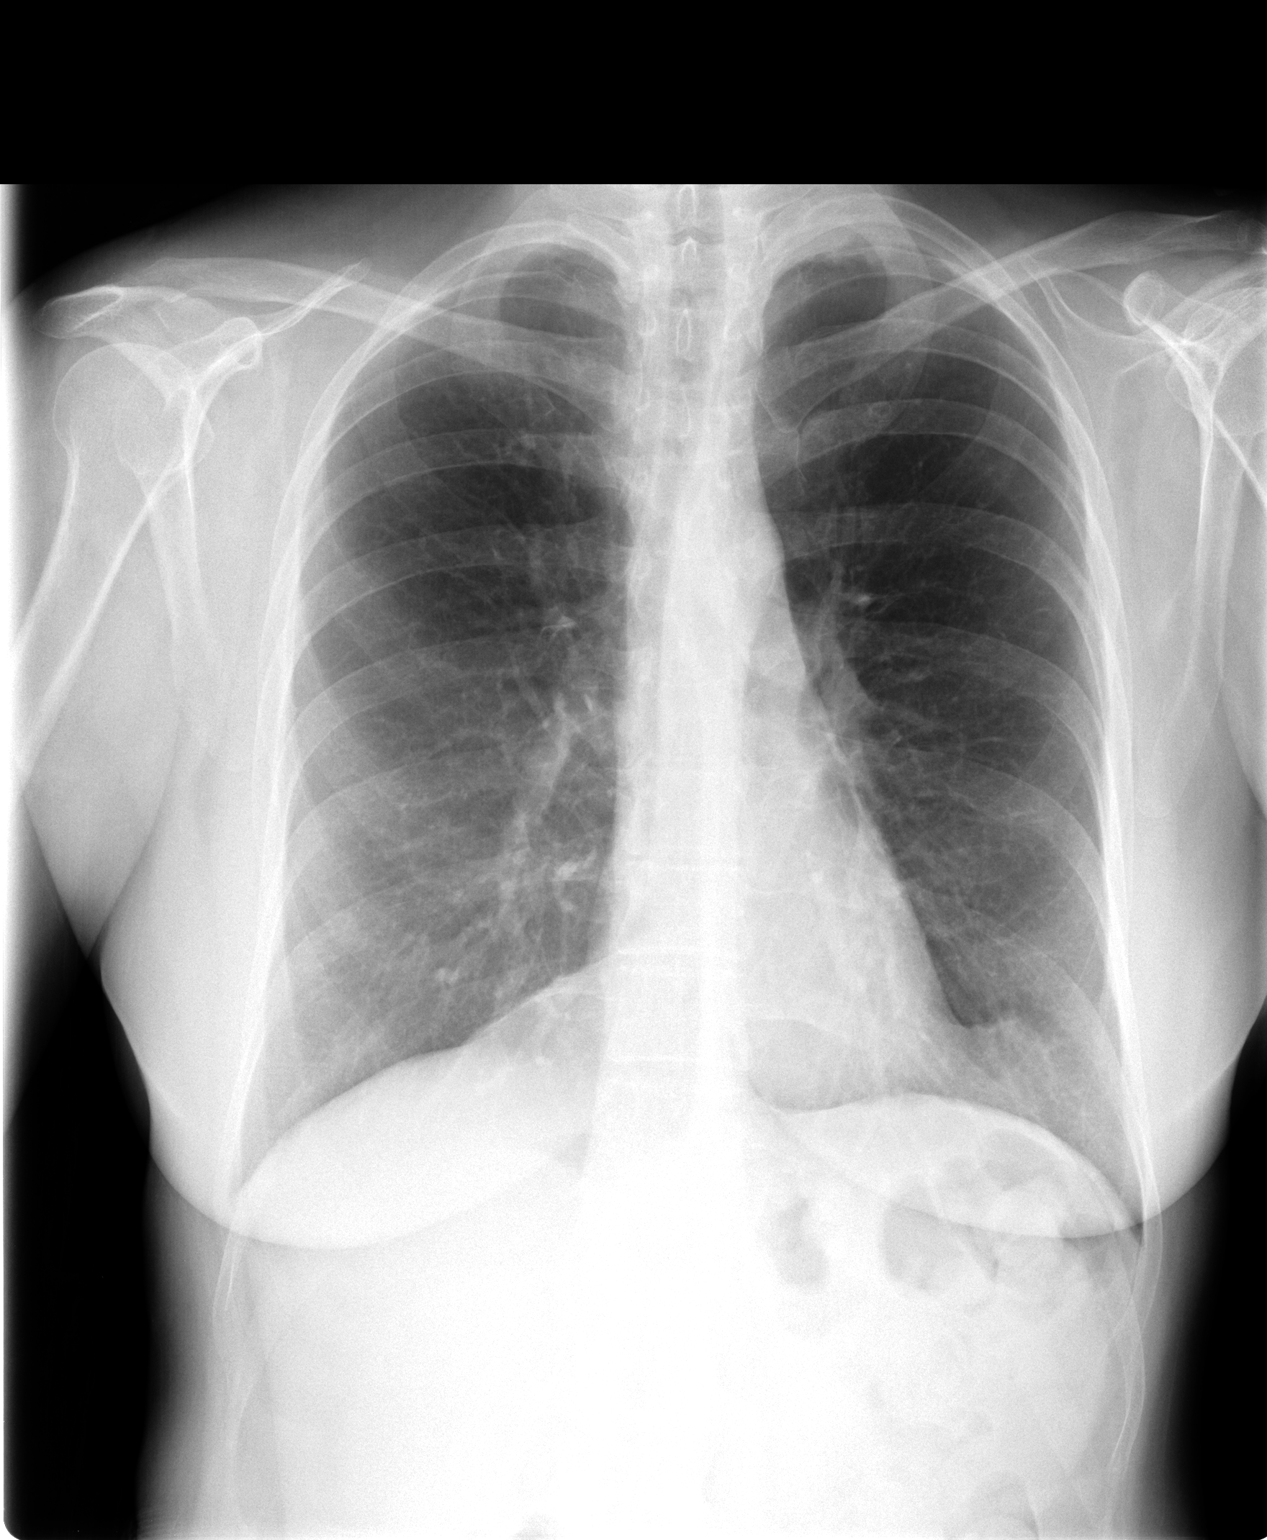

[view not recorded (2 of 2)]
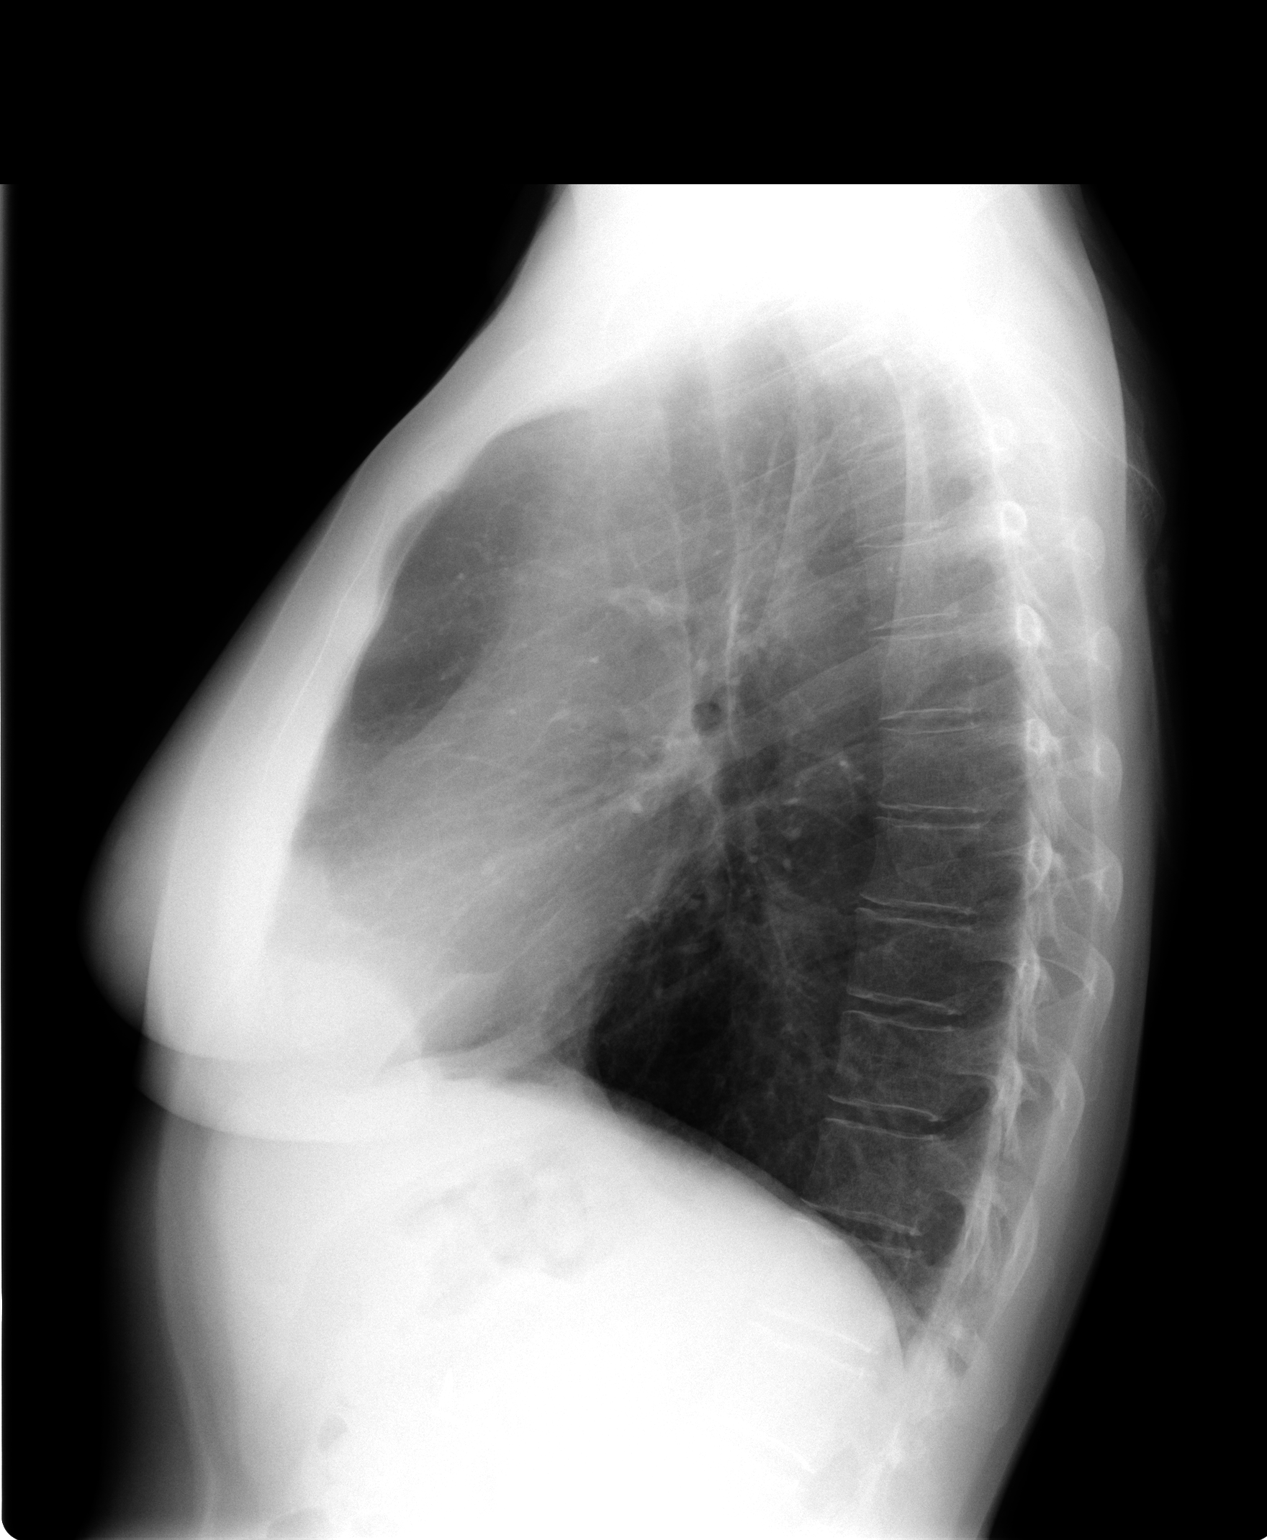

[2 of 2 positions shown; findings below may reference images not displayed]

FINDINGS: Chest is hyperexpanded consistent with history of emphysema.  Lungs are clear.  No effusion.  Heart and mediastinum appear normal.
IMPRESSION: Emphysema without acute disease.
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
## 2007-12-08 ENCOUNTER — Ambulatory Visit: Payer: Self-pay | Admitting: Gastroenterology

## 2007-12-08 LAB — CONVERTED CEMR LAB
ALT: 13 units/L (ref 0–35)
AST: 21 units/L (ref 0–37)
Albumin: 4 g/dL (ref 3.5–5.2)
BUN: 5 mg/dL — ABNORMAL LOW (ref 6–23)
Basophils Absolute: 0.1 10*3/uL (ref 0.0–0.1)
Calcium: 9.2 mg/dL (ref 8.4–10.5)
Chloride: 103 meq/L (ref 96–112)
Creatinine, Ser: 0.7 mg/dL (ref 0.4–1.2)
Eosinophils Absolute: 0.2 10*3/uL (ref 0.0–0.6)
GFR calc non Af Amer: 94 mL/min
HCT: 32 % — ABNORMAL LOW (ref 36.0–46.0)
Iron: 22 ug/dL — ABNORMAL LOW (ref 42–145)
Ketones, ur: NEGATIVE mg/dL
MCHC: 31.8 g/dL (ref 30.0–36.0)
MCV: 73.7 fL — ABNORMAL LOW (ref 78.0–100.0)
Monocytes Absolute: 0.7 10*3/uL (ref 0.2–0.7)
Mucus, UA: NEGATIVE
Neutrophils Relative %: 67.5 % (ref 43.0–77.0)
Platelets: 375 10*3/uL (ref 150–400)
RDW: 16.2 % — ABNORMAL HIGH (ref 11.5–14.6)
Sed Rate: 21 mm/hr (ref 0–25)
Transferrin: 325.4 mg/dL (ref >=212.0)
Urine Glucose: NEGATIVE mg/dL
Urobilinogen, UA: 0.2 (ref 0.0–1.0)
Vitamin B-12: 335 pg/mL (ref 211–911)
pH: 6 (ref 5.0–8.0)

## 2007-12-10 ENCOUNTER — Ambulatory Visit (HOSPITAL_COMMUNITY): Admission: RE | Admit: 2007-12-10 | Discharge: 2007-12-10 | Payer: Self-pay | Admitting: Gastroenterology

## 2007-12-13 ENCOUNTER — Emergency Department (HOSPITAL_COMMUNITY): Admission: EM | Admit: 2007-12-13 | Discharge: 2007-12-13 | Payer: Self-pay | Admitting: Emergency Medicine

## 2007-12-21 ENCOUNTER — Emergency Department (HOSPITAL_COMMUNITY): Admission: EM | Admit: 2007-12-21 | Discharge: 2007-12-21 | Payer: Self-pay | Admitting: Emergency Medicine

## 2008-01-12 ENCOUNTER — Emergency Department (HOSPITAL_COMMUNITY): Admission: EM | Admit: 2008-01-12 | Discharge: 2008-01-12 | Payer: Self-pay | Admitting: Emergency Medicine

## 2008-01-25 IMAGING — CR DG CHEST 2V
2 series · 2 of 2 positions shown · non-contrast
Comparison: 07/29/2005.

CLINICAL DATA: Shortness of breath, cough.  
 CHEST - 2 VIEW:

[view not recorded (1 of 2)]
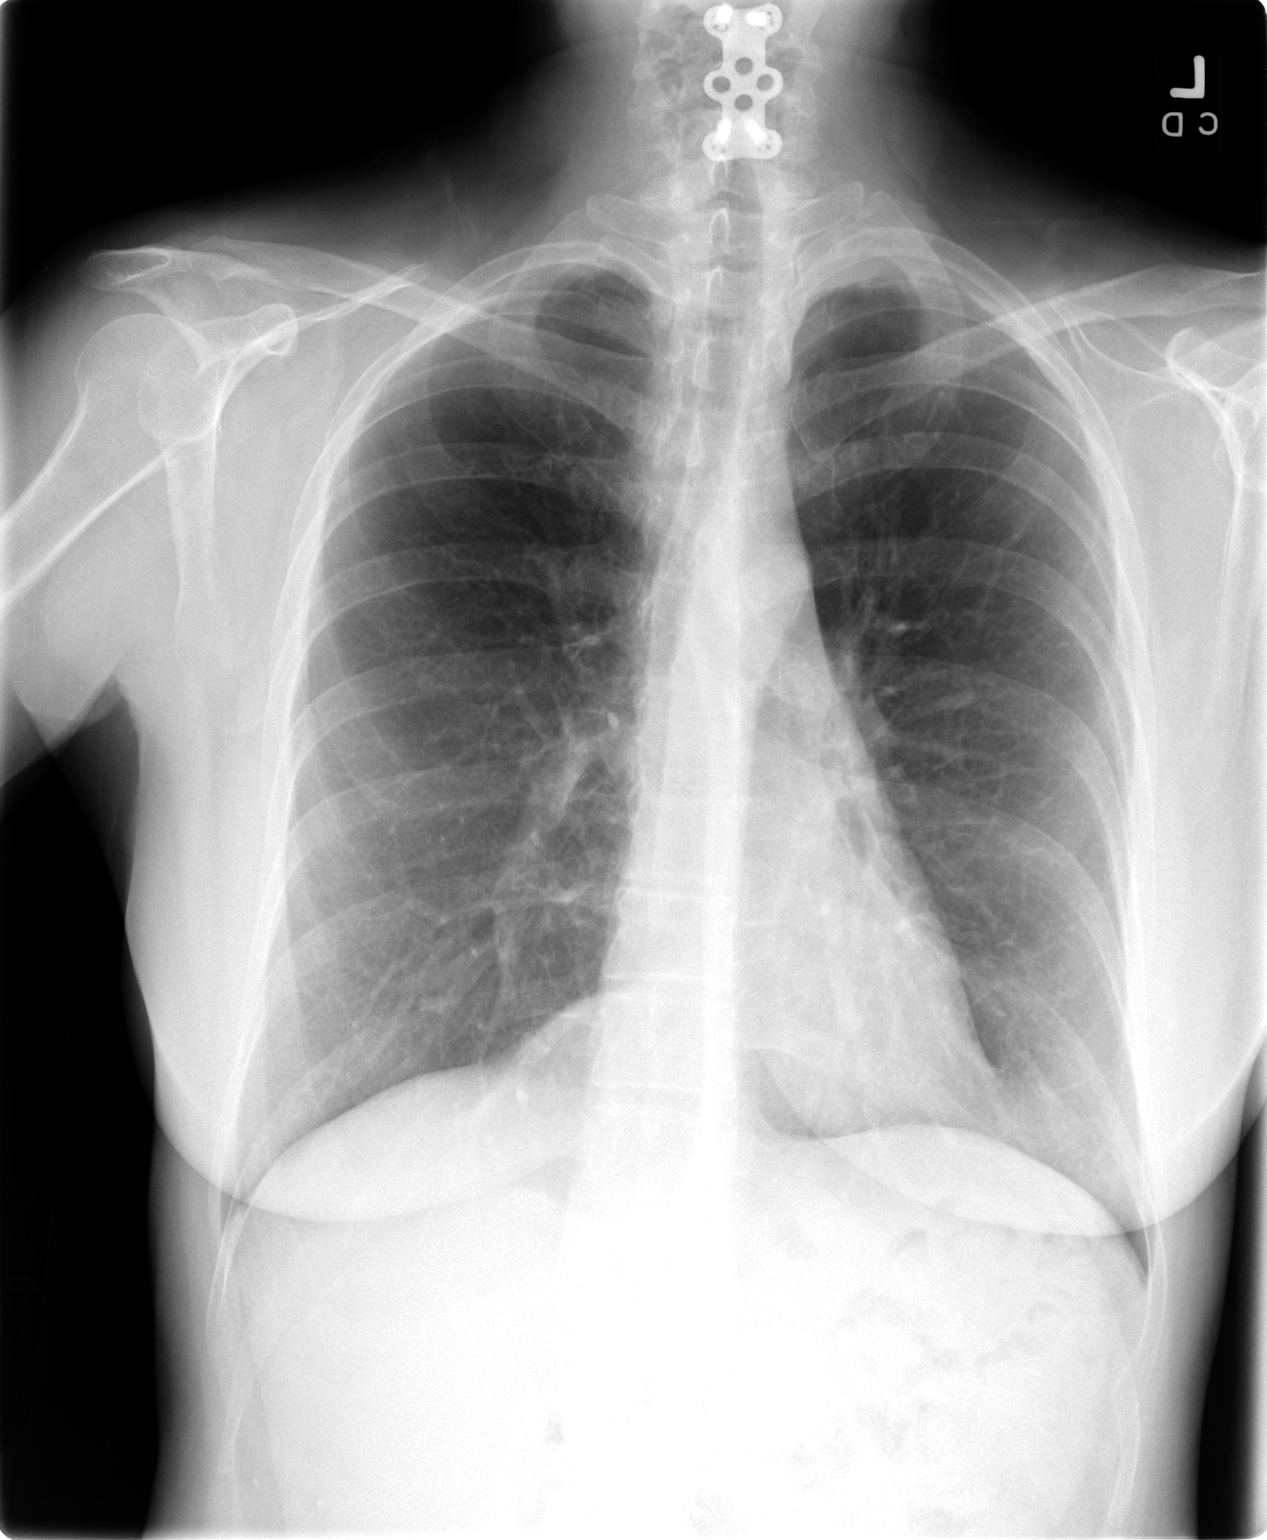

[view not recorded (2 of 2)]
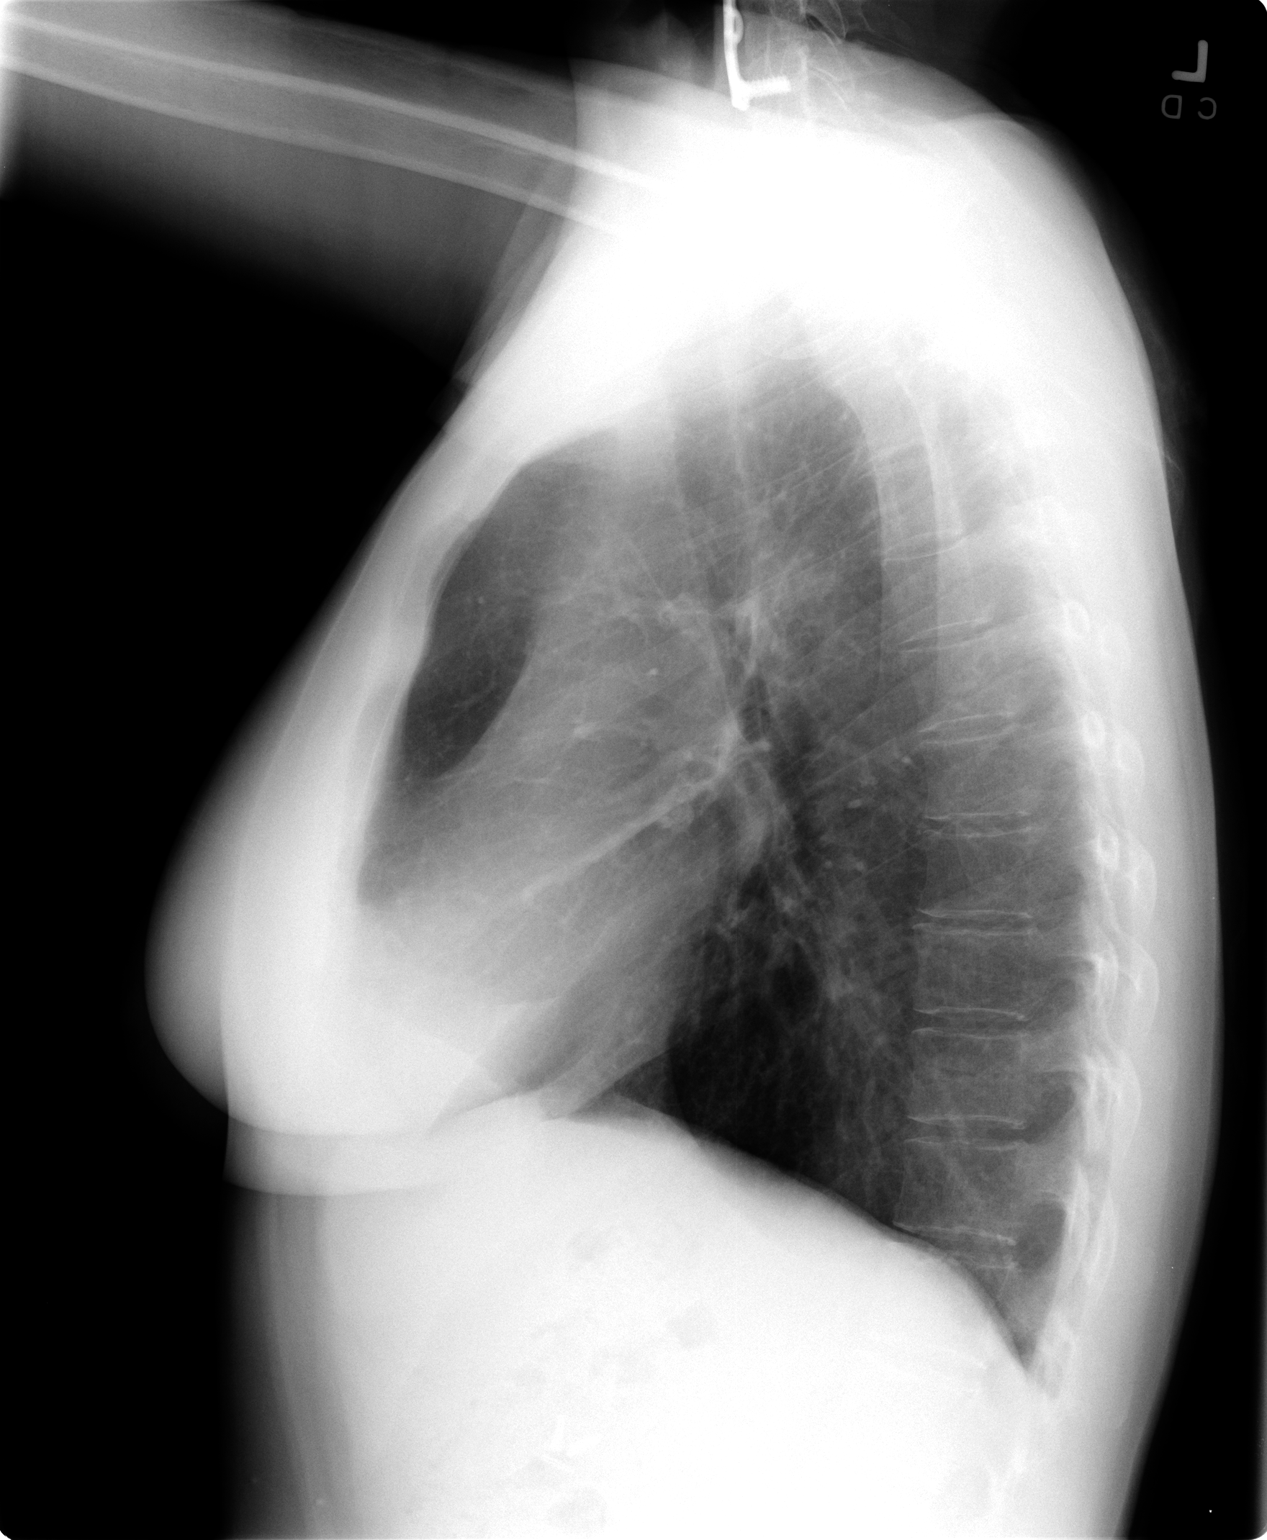

[2 of 2 positions shown; findings below may reference images not displayed]

Previous cervical fusion.  COPD is present with hyperinflation.  No infiltrates, pneumothorax or pulmonary nodules.  Cardiac size normal.  No bony abnormality.  No change from prior study.  Previous cholecystectomy with clips in the right upper quadrant.
IMPRESSION: COPD, no active disease.

## 2008-01-28 IMAGING — CR DG SMALL BOWEL
3 series · 3 of 3 positions shown · non-contrast
Comparison: none

CLINICAL DATA: Diarrhea.  Question Crohn's disease.  
 SMALL BOWEL SERIES:

[view not recorded (1 of 3)]
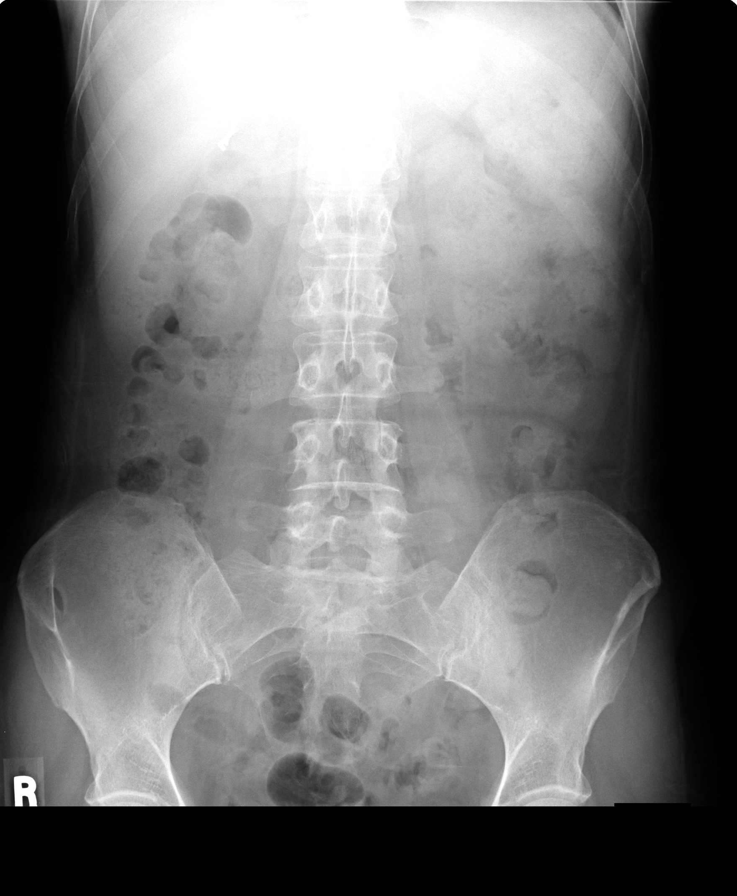

[view not recorded (2 of 3)]
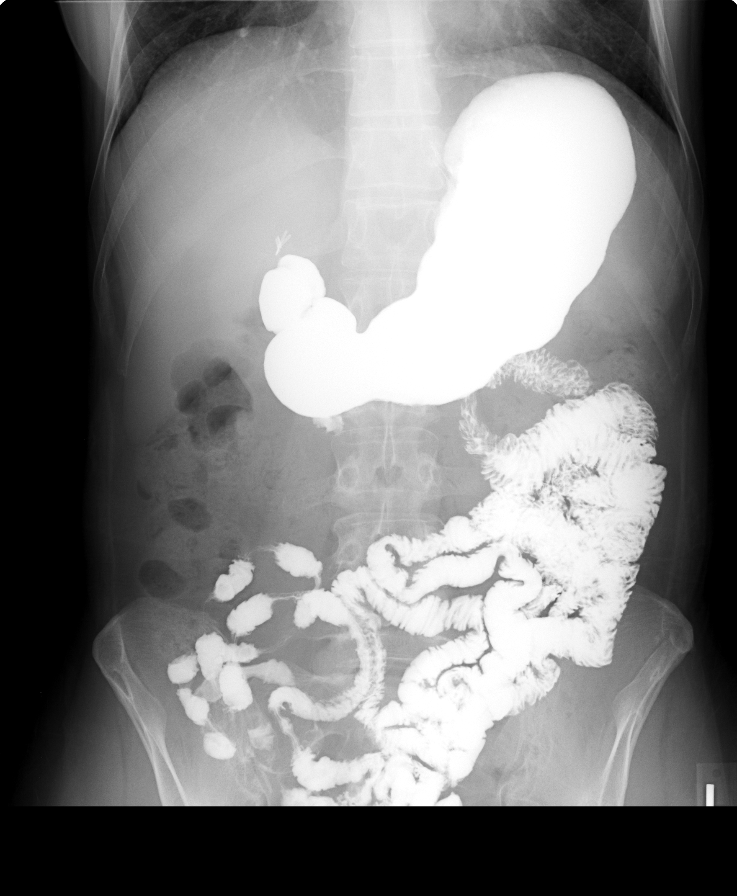

[view not recorded (3 of 3)]
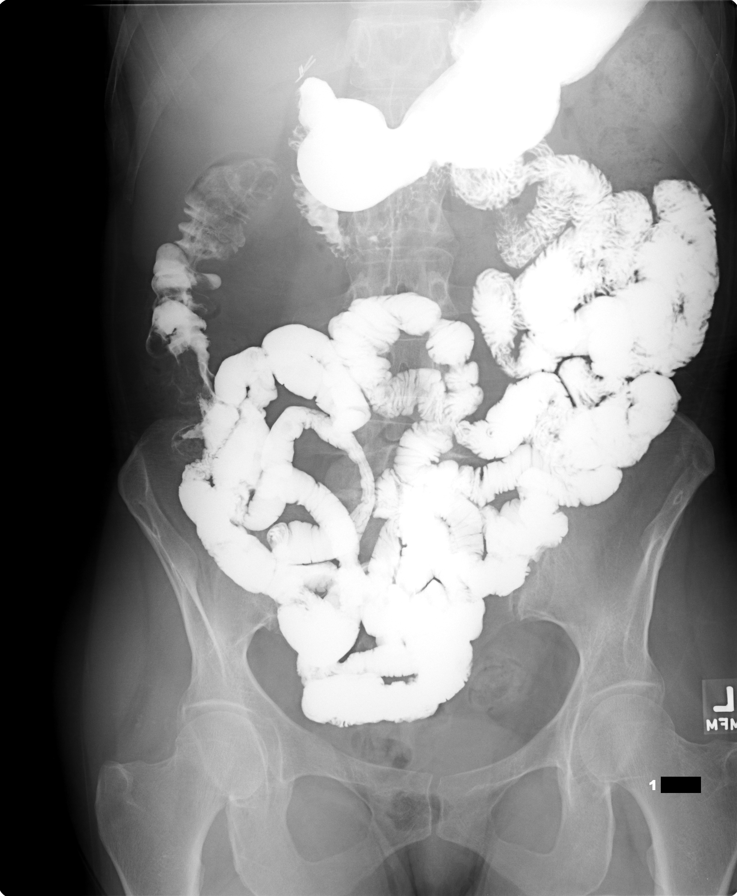

[3 of 3 positions shown; findings below may reference images not displayed]

FINDINGS: The small bowel transit time was normal with barium entering the colon at 1 hour.  The mucosal pattern of the small bowel was normal.  The terminal ileum was visualized and had a normal appearance.  There were no persistent intrinsic or extrinsic filling defects.
IMPRESSION: Normal small bowel series.

## 2008-02-29 IMAGING — CR DG ANKLE COMPLETE 3+V*R*
3 series · 3 of 3 positions shown · non-contrast
Comparison: None.
COMPARISON: None.

CLINICAL DATA: Trauma. 
 RIGHT KNEE ? 4 VIEW:

[t ankle joint ap right]
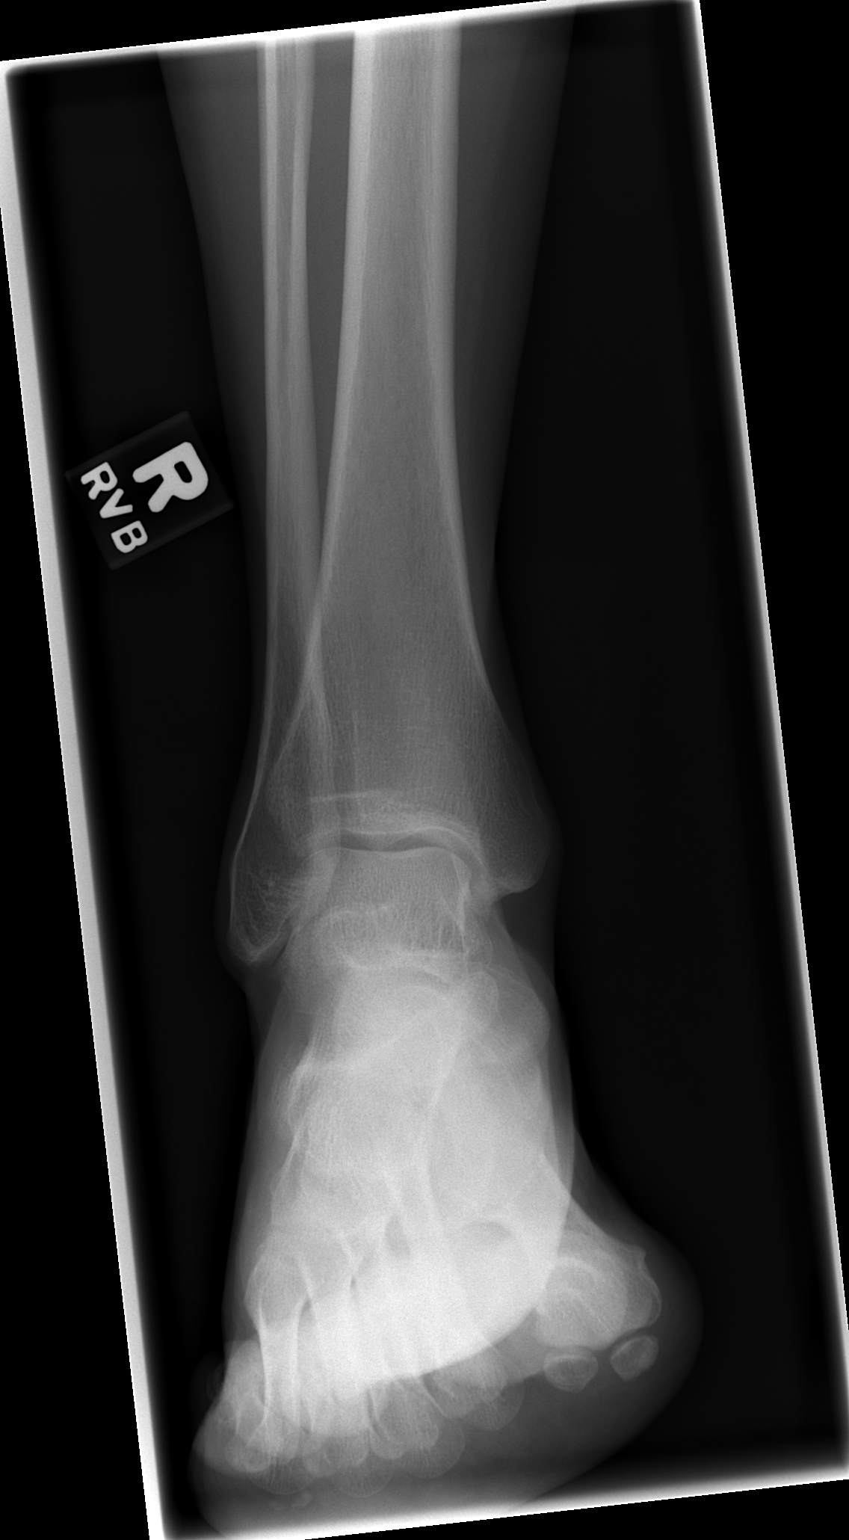

[t ankle joint oblique right]
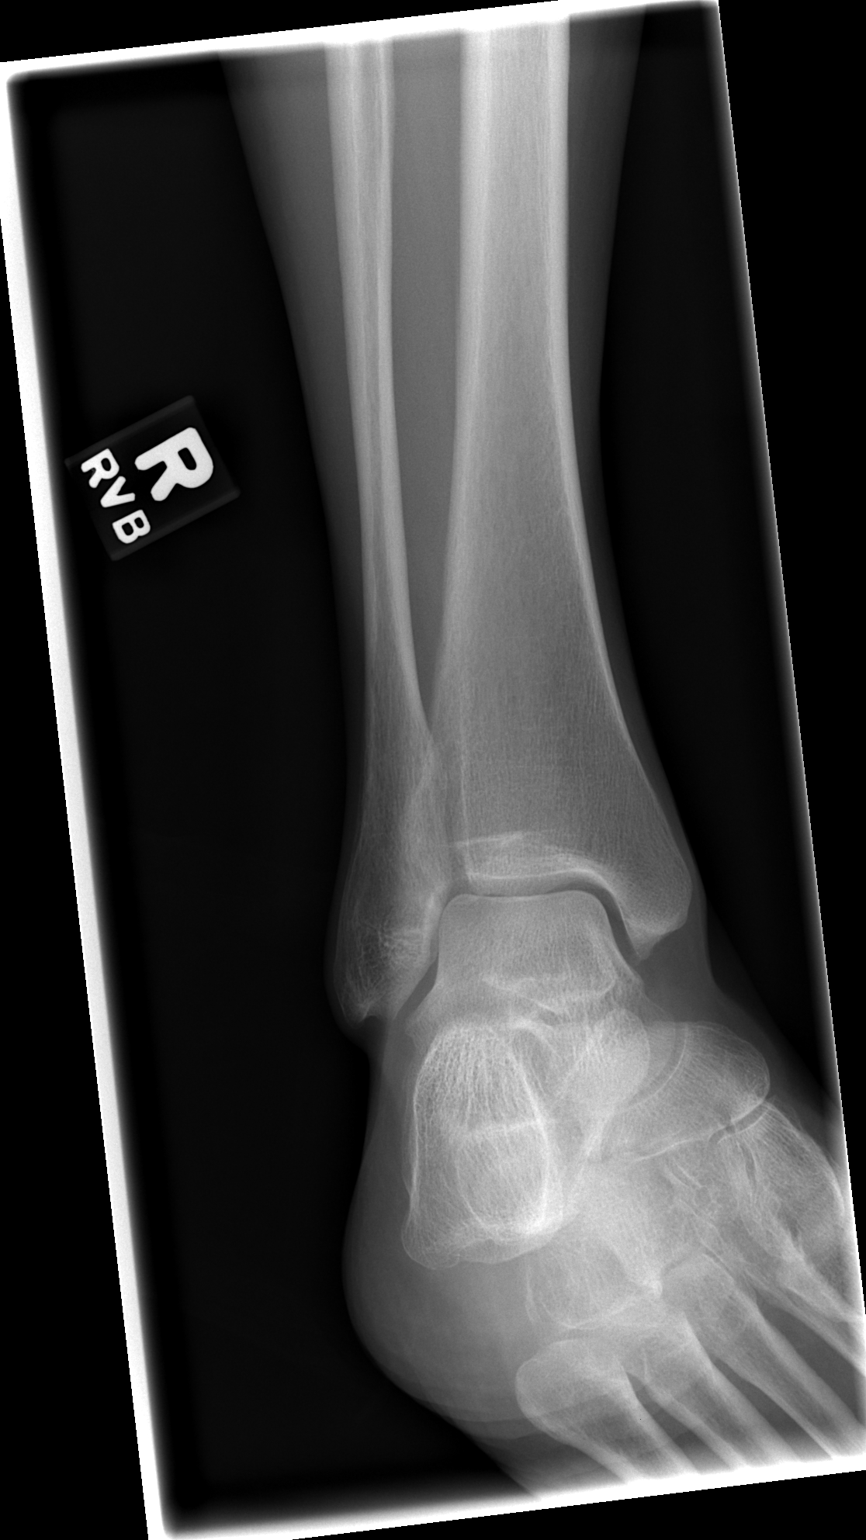

[t ankle joint lat right]
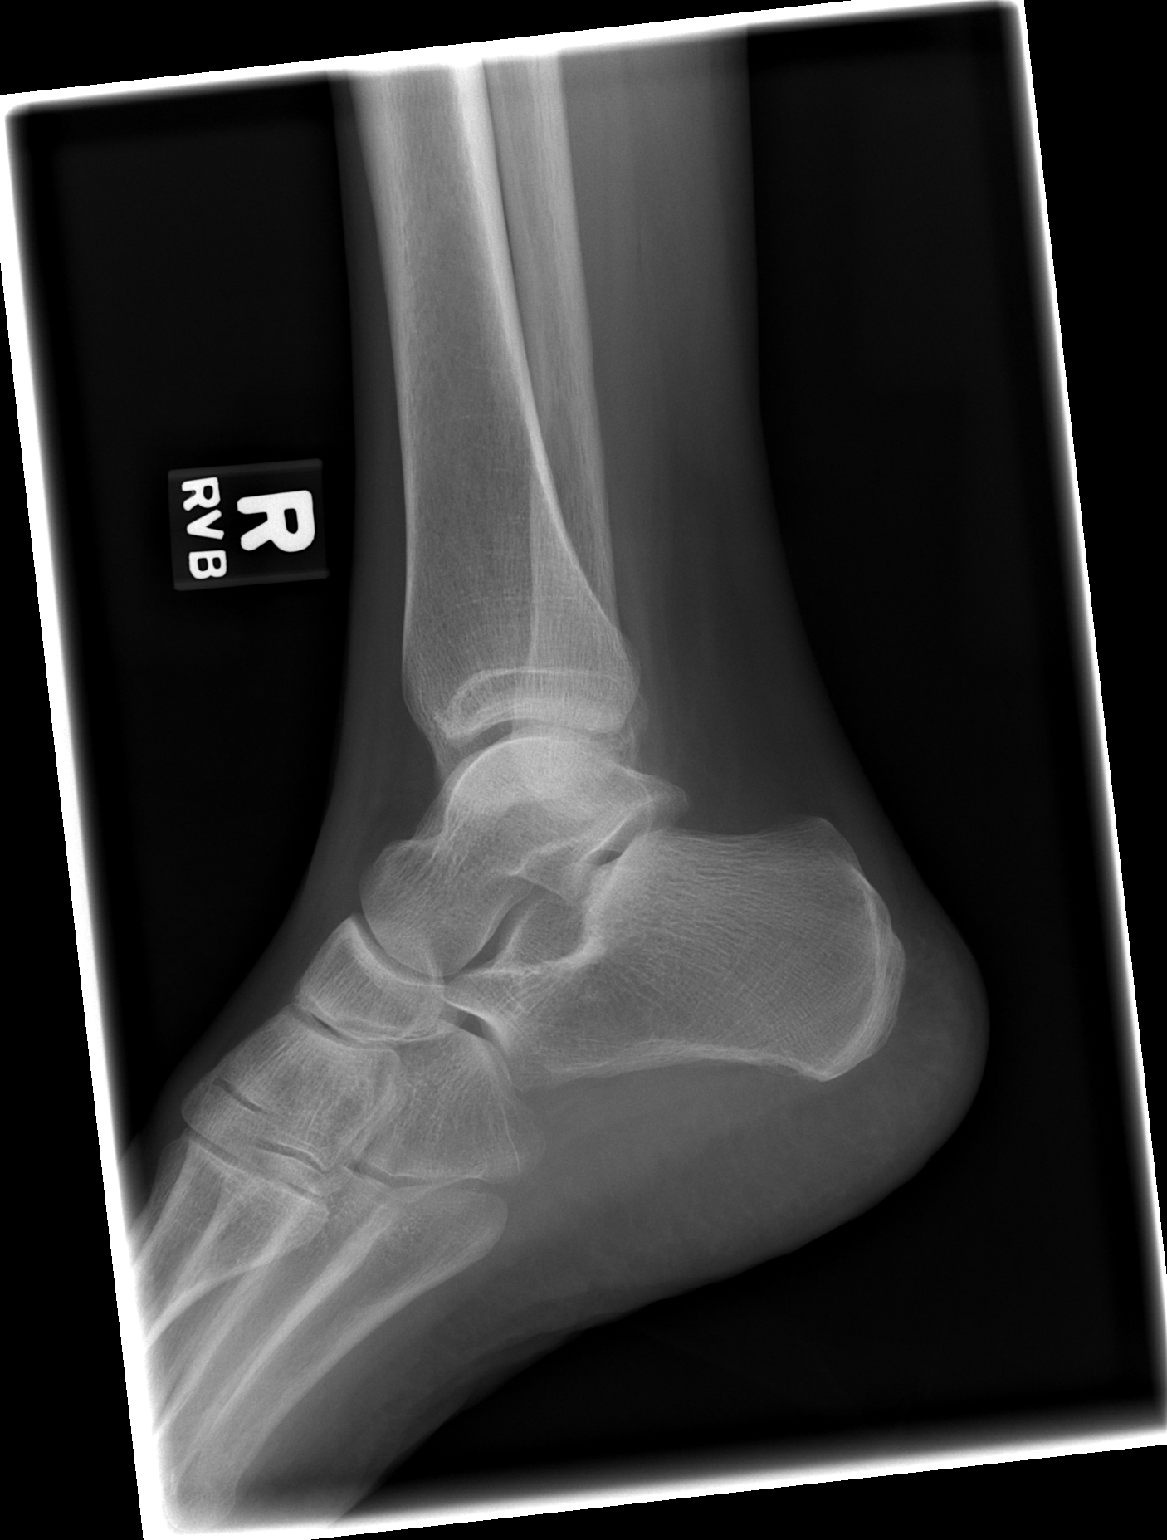

[3 of 3 positions shown; findings below may reference images not displayed]

FINDINGS: There is no evidence of fracture, dislocation, or joint effusion.  There is no evidence of arthropathy or other focal bone abnormality.  Soft tissues are unremarkable.
IMPRESSION: Negative.
 RIGHT ANKLE ? 3 VIEW:
FINDINGS: No fracture or dislocation.  Ankle mortise, talar dome, and base of fifth metatarsal all intact.  No significant soft tissue swelling.
IMPRESSION: No acute osseous abnormality.

## 2008-02-29 IMAGING — CR DG HIP COMPLETE 2+V*R*
4 series · 4 of 4 positions shown · non-contrast
Comparison: None.

CLINICAL DATA: Fall, with right-sided pain.  
 RIGHT HIP ? 4 VIEW:

[t hip ap right (1 of 2)]
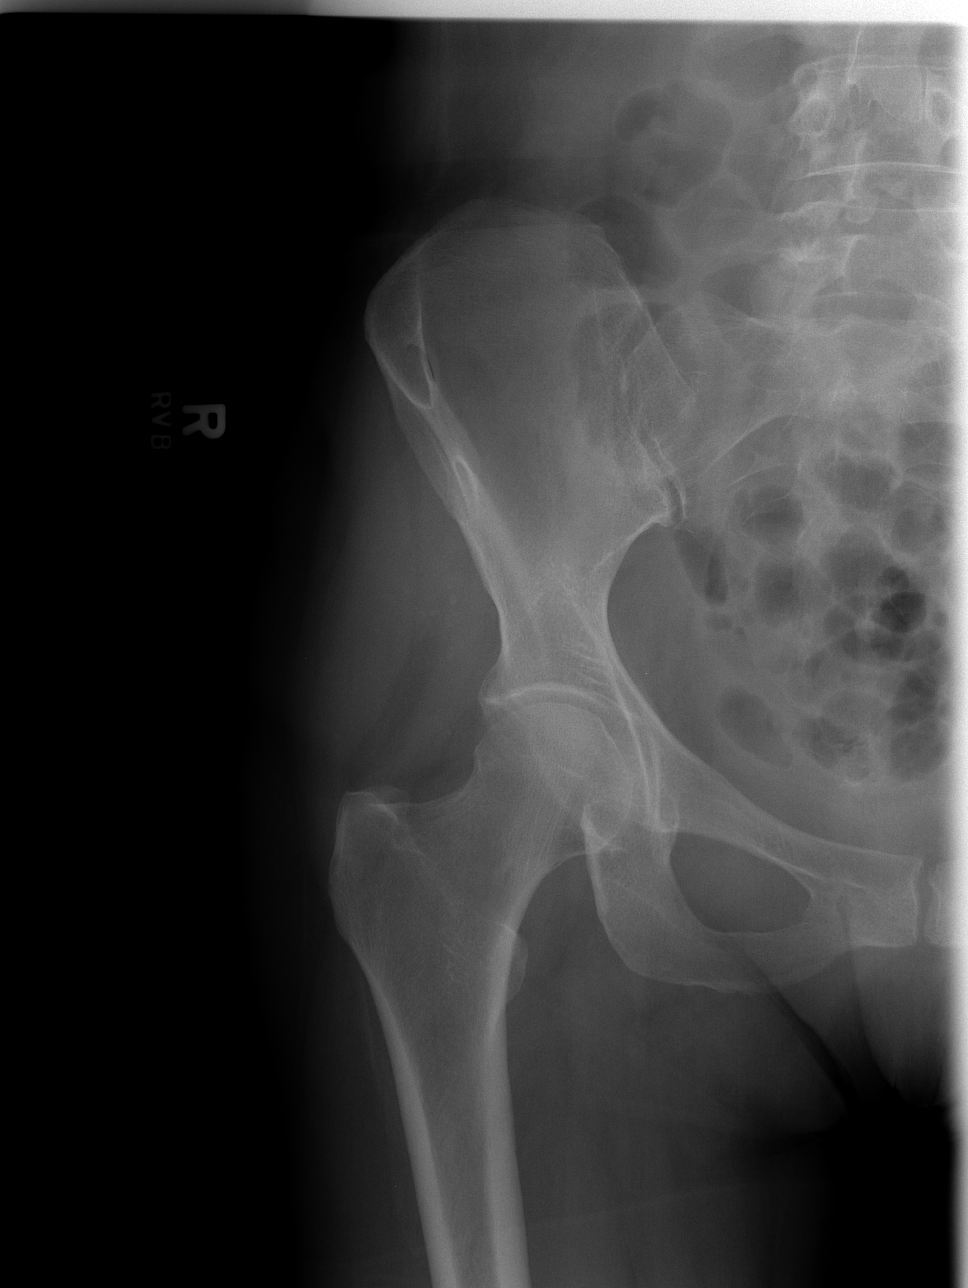

[t hip ap right (2 of 2)]
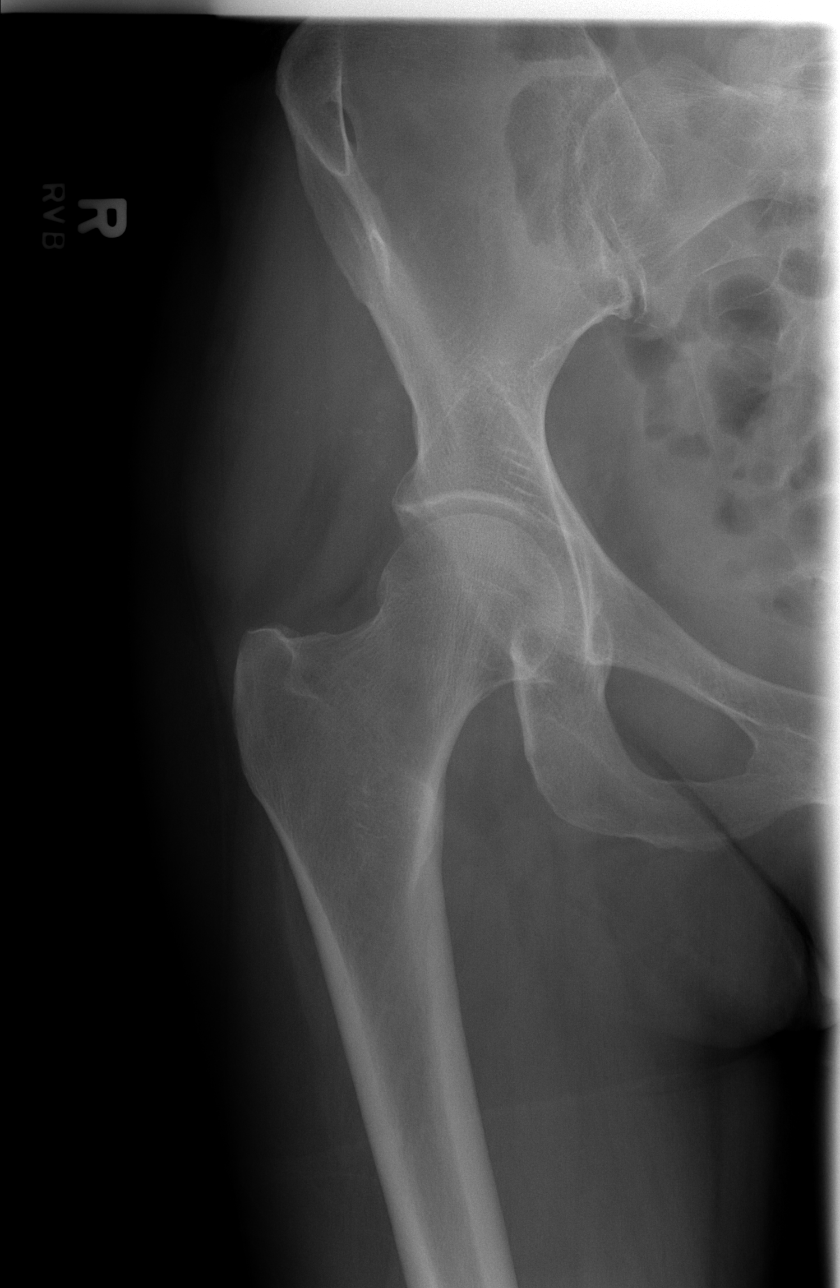

[t hip frog leg right]
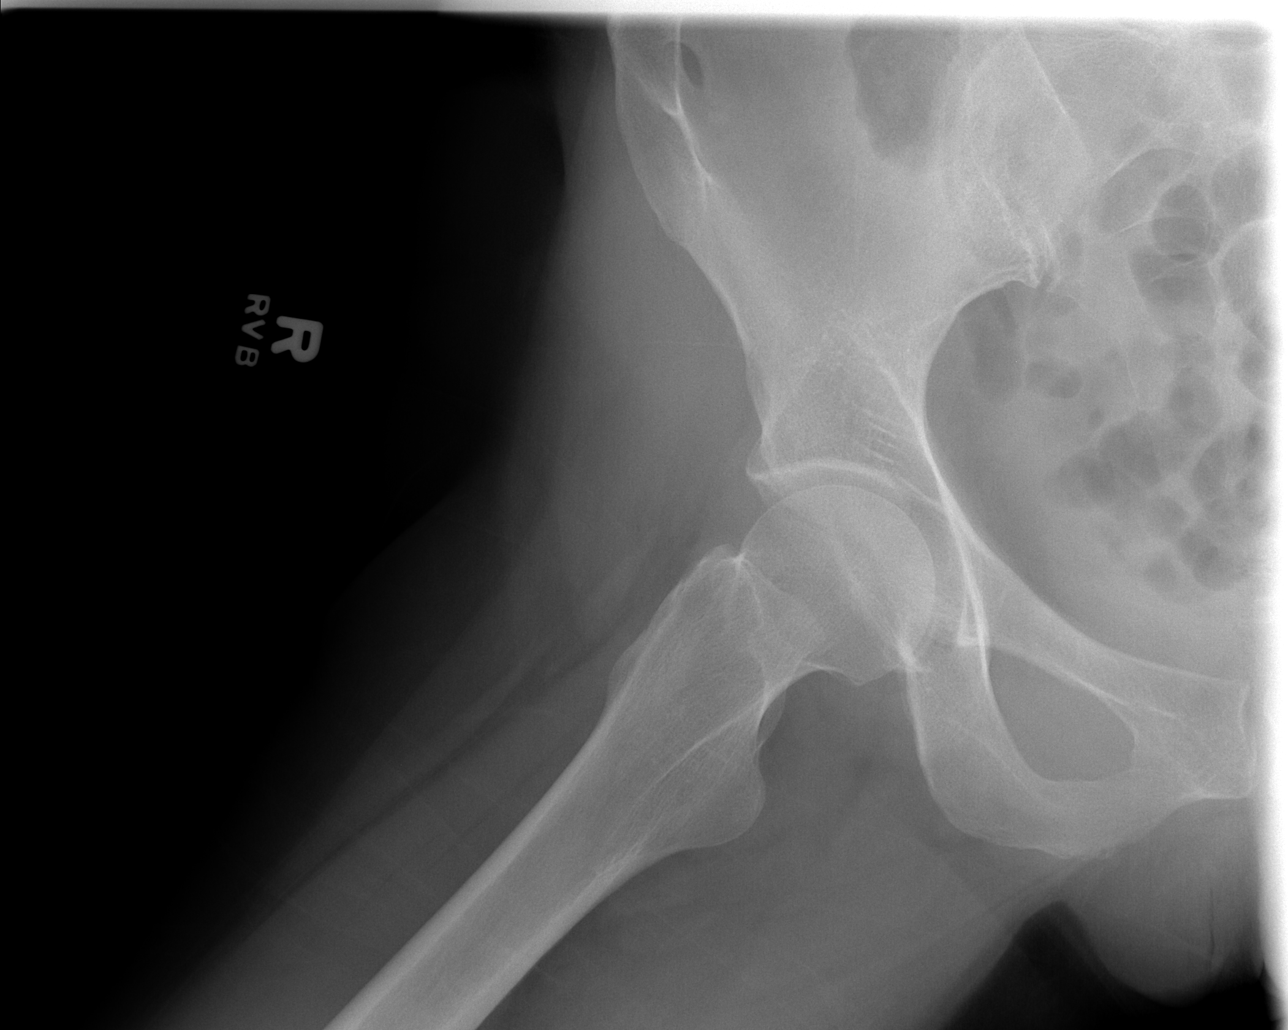

[t pelvis a.p.]
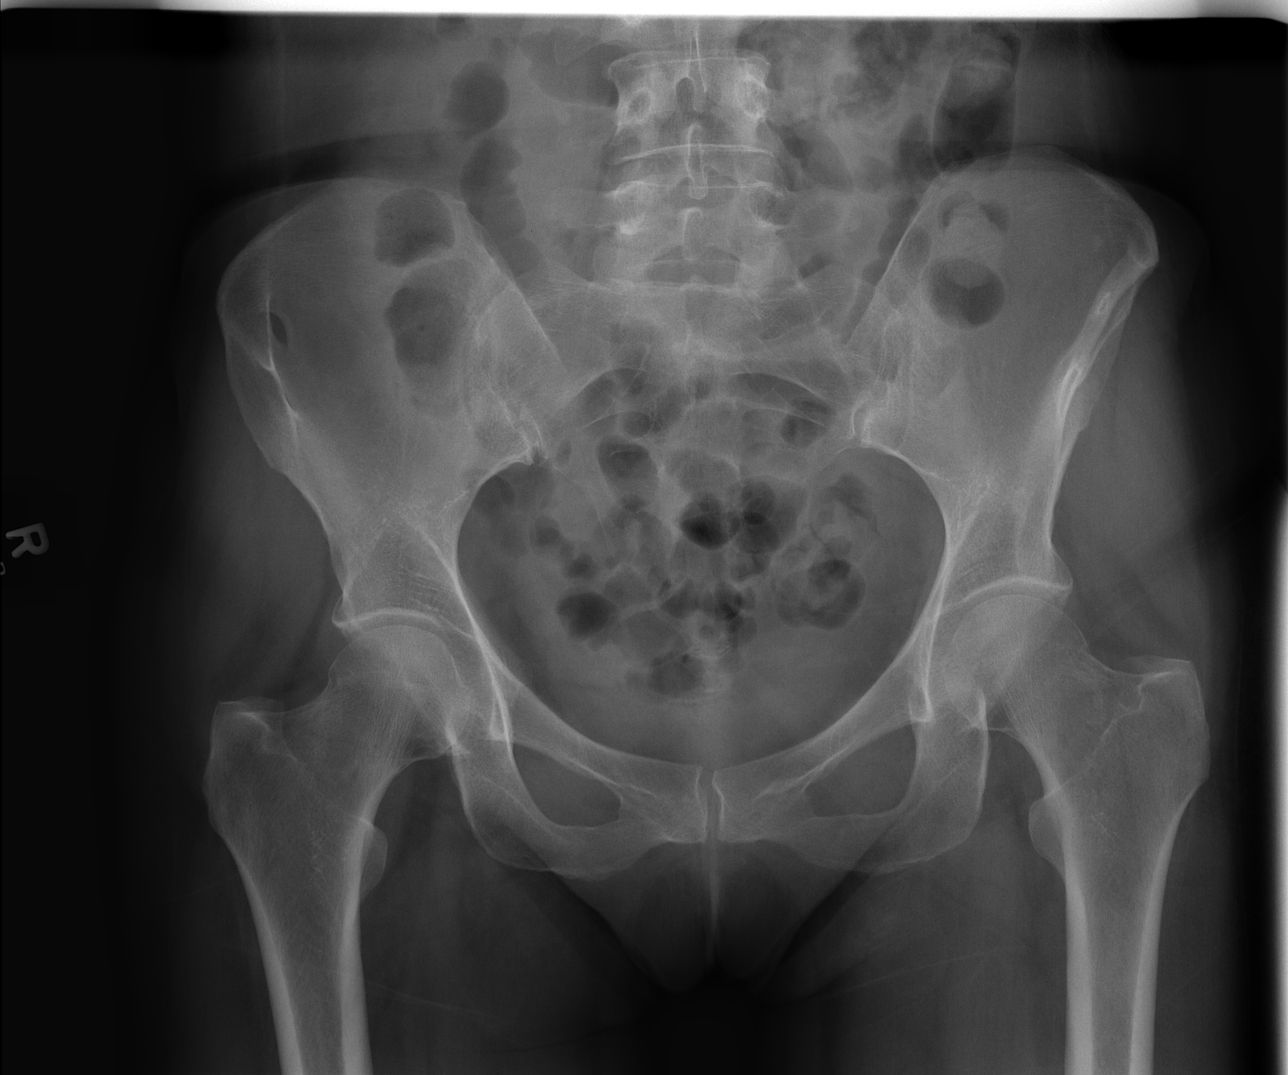

[4 of 4 positions shown; findings below may reference images not displayed]

FINDINGS: No fracture or dislocation.  Visualized portions of the pelvis unremarkable.  Sacroiliac joints symmetric.
IMPRESSION: No acute osseous abnormality.

## 2008-02-29 IMAGING — CR DG KNEE COMPLETE 4+V*R*
4 series · 4 of 4 positions shown · non-contrast
Comparison: None.
COMPARISON: None.

CLINICAL DATA: Trauma. 
 RIGHT KNEE ? 4 VIEW:

[t knee ap right]
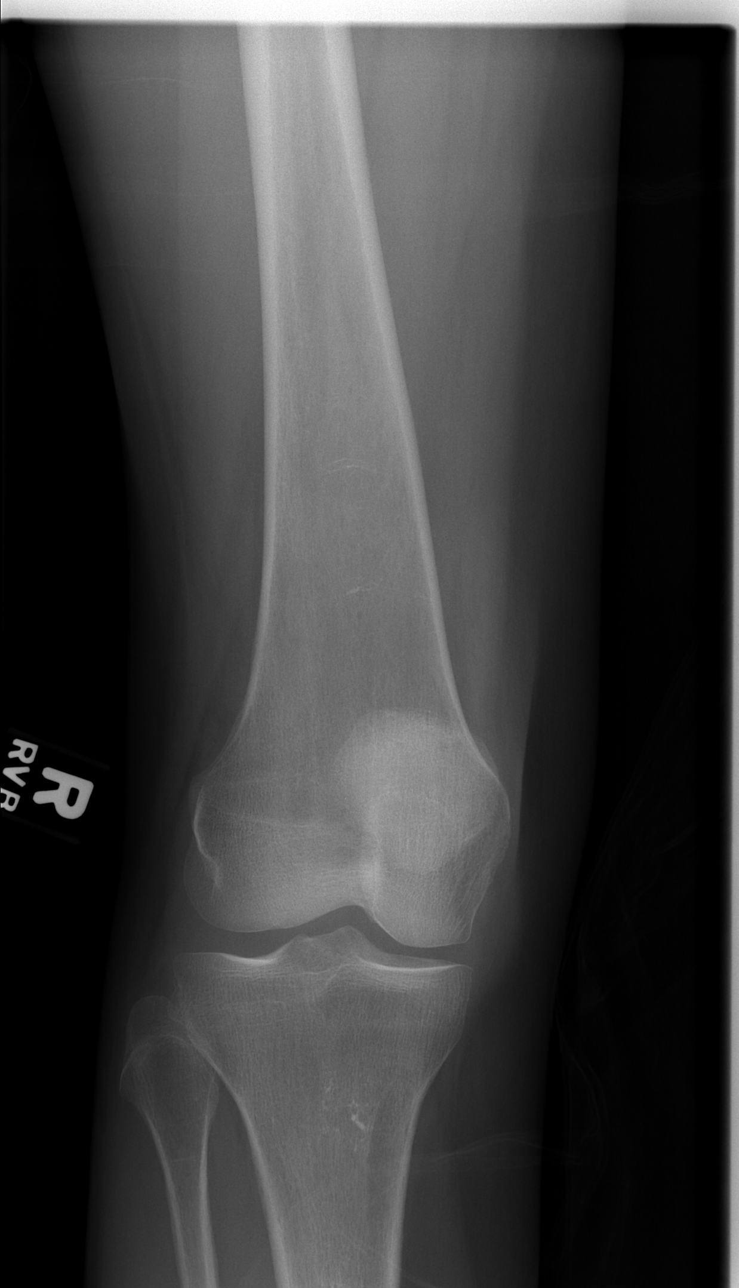

[t knee oblique right (1 of 2)]
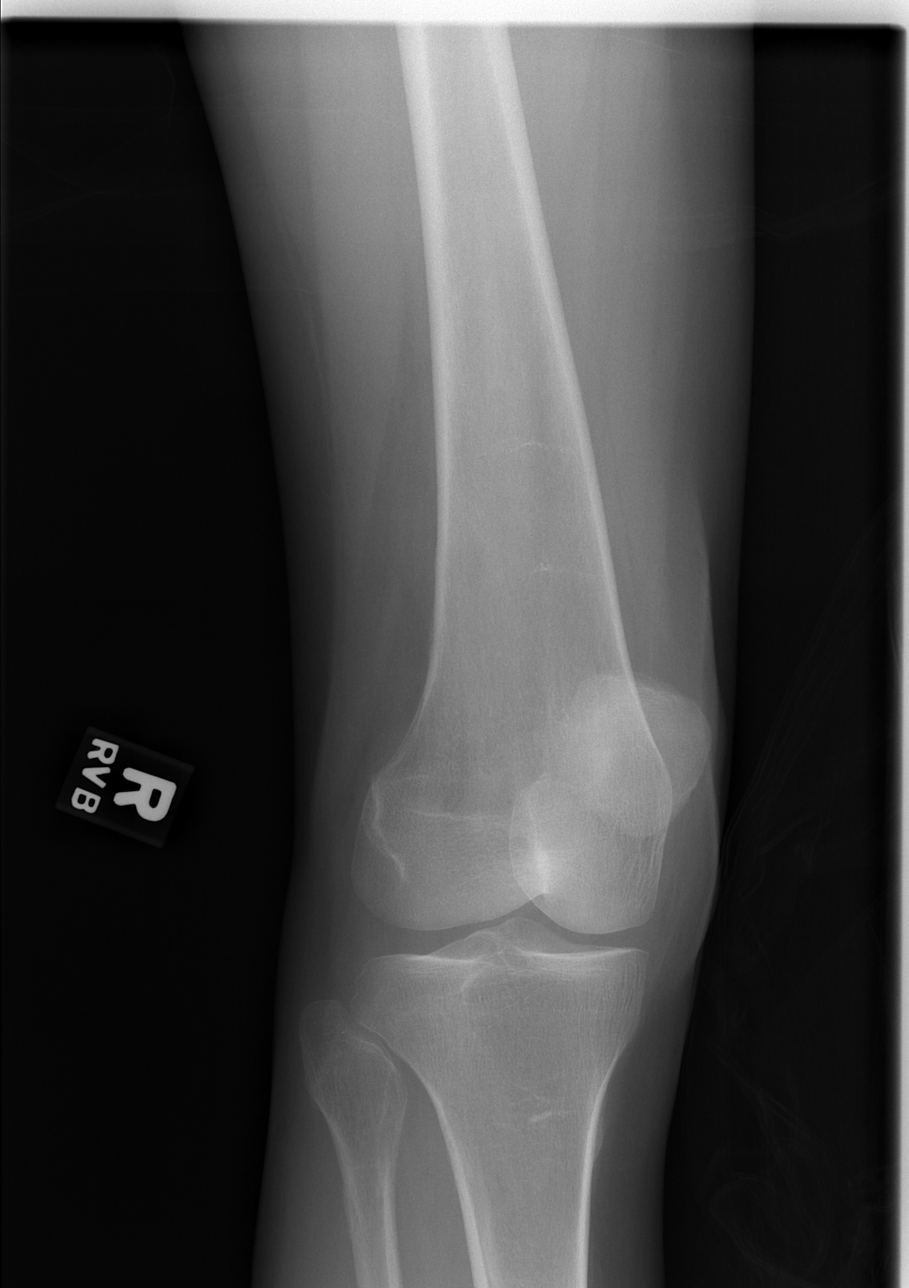

[t knee oblique right (2 of 2)]
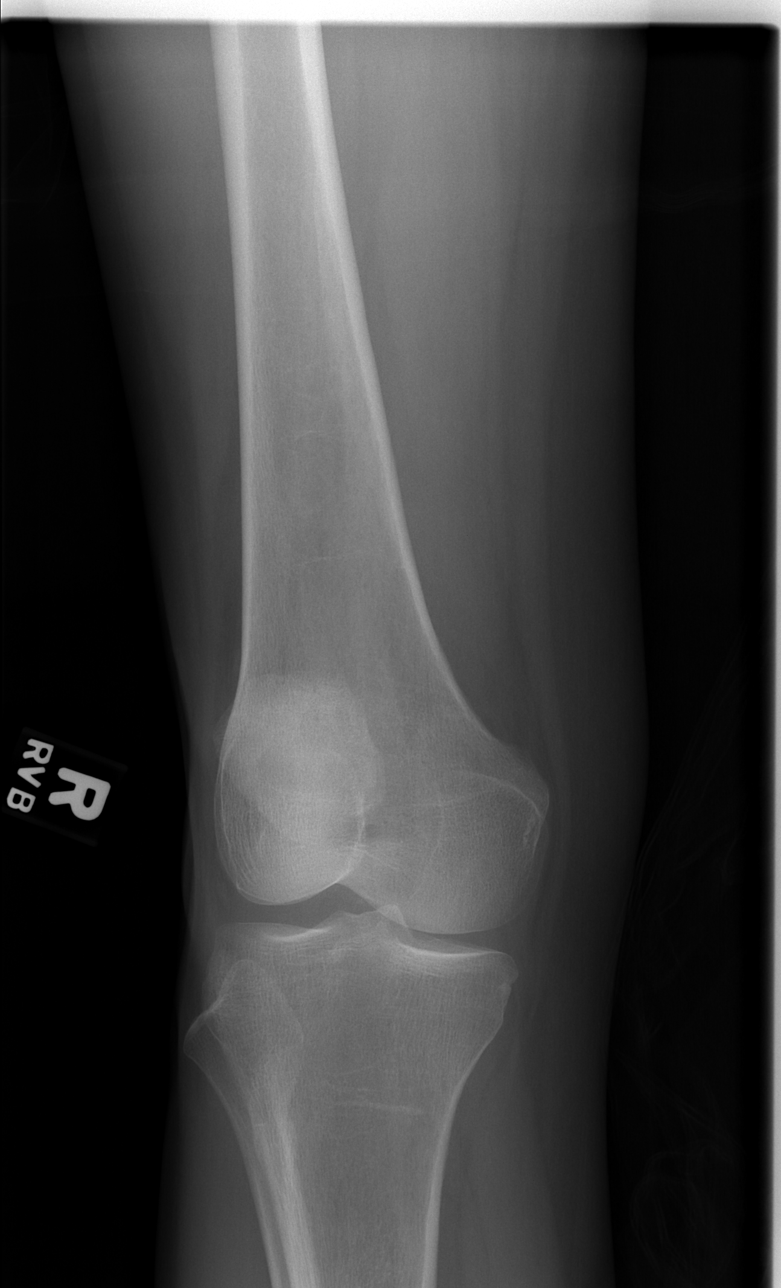

[t knee lat right]
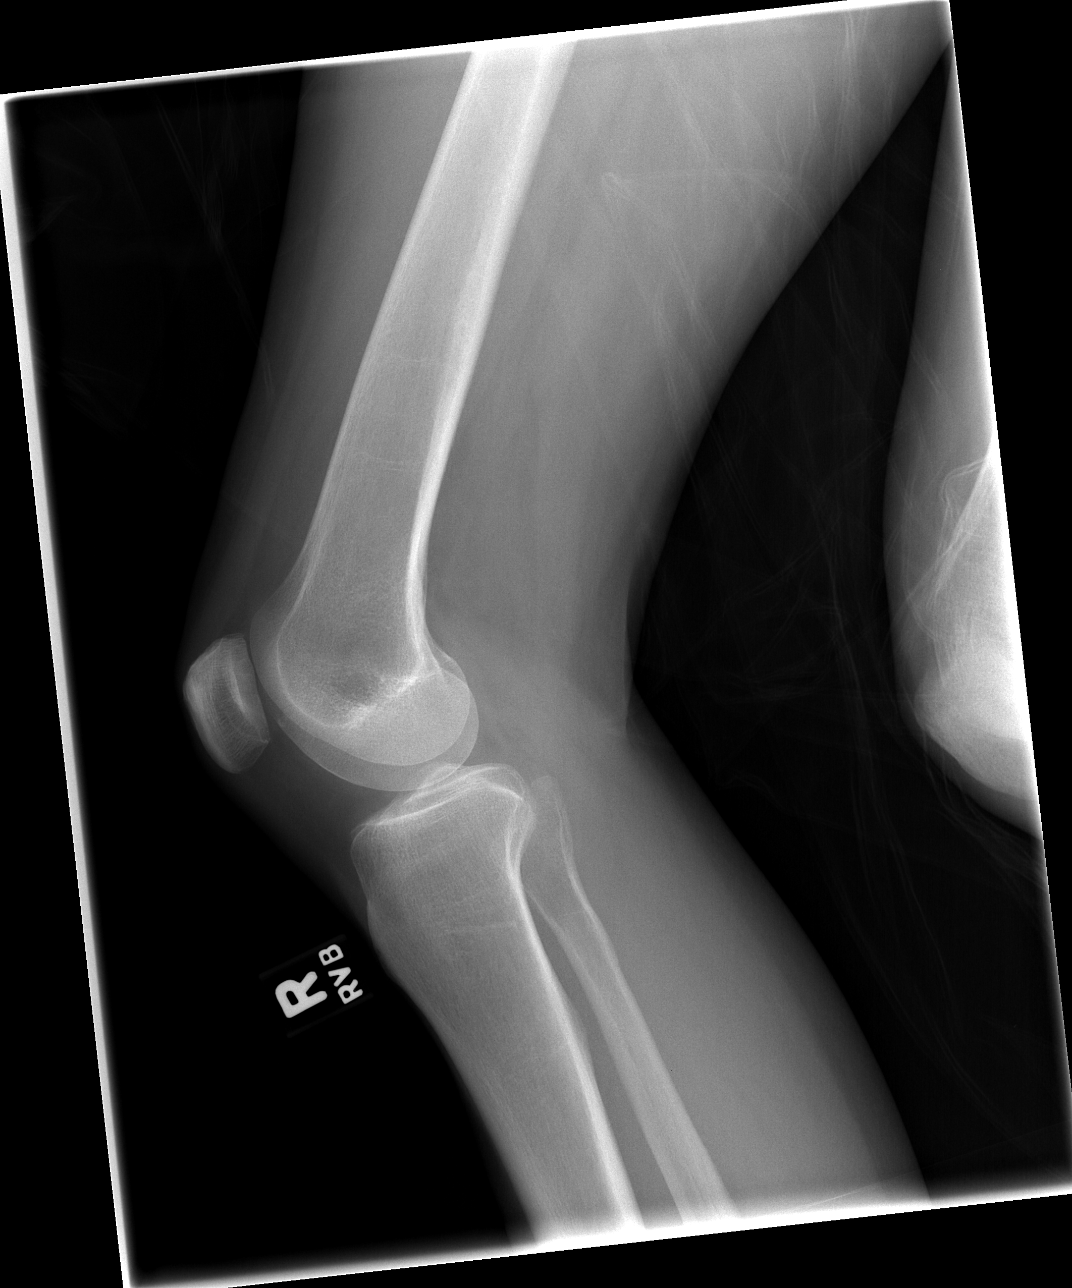

[4 of 4 positions shown; findings below may reference images not displayed]

FINDINGS: There is no evidence of fracture, dislocation, or joint effusion.  There is no evidence of arthropathy or other focal bone abnormality.  Soft tissues are unremarkable.
IMPRESSION: Negative.
 RIGHT ANKLE ? 3 VIEW:
FINDINGS: No fracture or dislocation.  Ankle mortise, talar dome, and base of fifth metatarsal all intact.  No significant soft tissue swelling.
IMPRESSION: No acute osseous abnormality.

## 2008-03-05 ENCOUNTER — Emergency Department (HOSPITAL_COMMUNITY): Admission: EM | Admit: 2008-03-05 | Discharge: 2008-03-05 | Payer: Self-pay | Admitting: Emergency Medicine

## 2008-04-04 ENCOUNTER — Emergency Department (HOSPITAL_COMMUNITY): Admission: EM | Admit: 2008-04-04 | Discharge: 2008-04-04 | Payer: Self-pay | Admitting: Emergency Medicine

## 2008-04-08 ENCOUNTER — Telehealth: Payer: Self-pay | Admitting: Gastroenterology

## 2008-04-08 ENCOUNTER — Encounter: Payer: Self-pay | Admitting: Gastroenterology

## 2008-04-25 ENCOUNTER — Emergency Department (HOSPITAL_COMMUNITY): Admission: EM | Admit: 2008-04-25 | Discharge: 2008-04-25 | Payer: Self-pay | Admitting: Emergency Medicine

## 2008-06-02 IMAGING — CR DG CHEST 2V
2 series · 2 of 2 positions shown · non-contrast
Comparison: 12/01/2005

CLINICAL DATA: Vomiting diarrhea fever.

CHEST - 2 VIEW

[w chest pa]
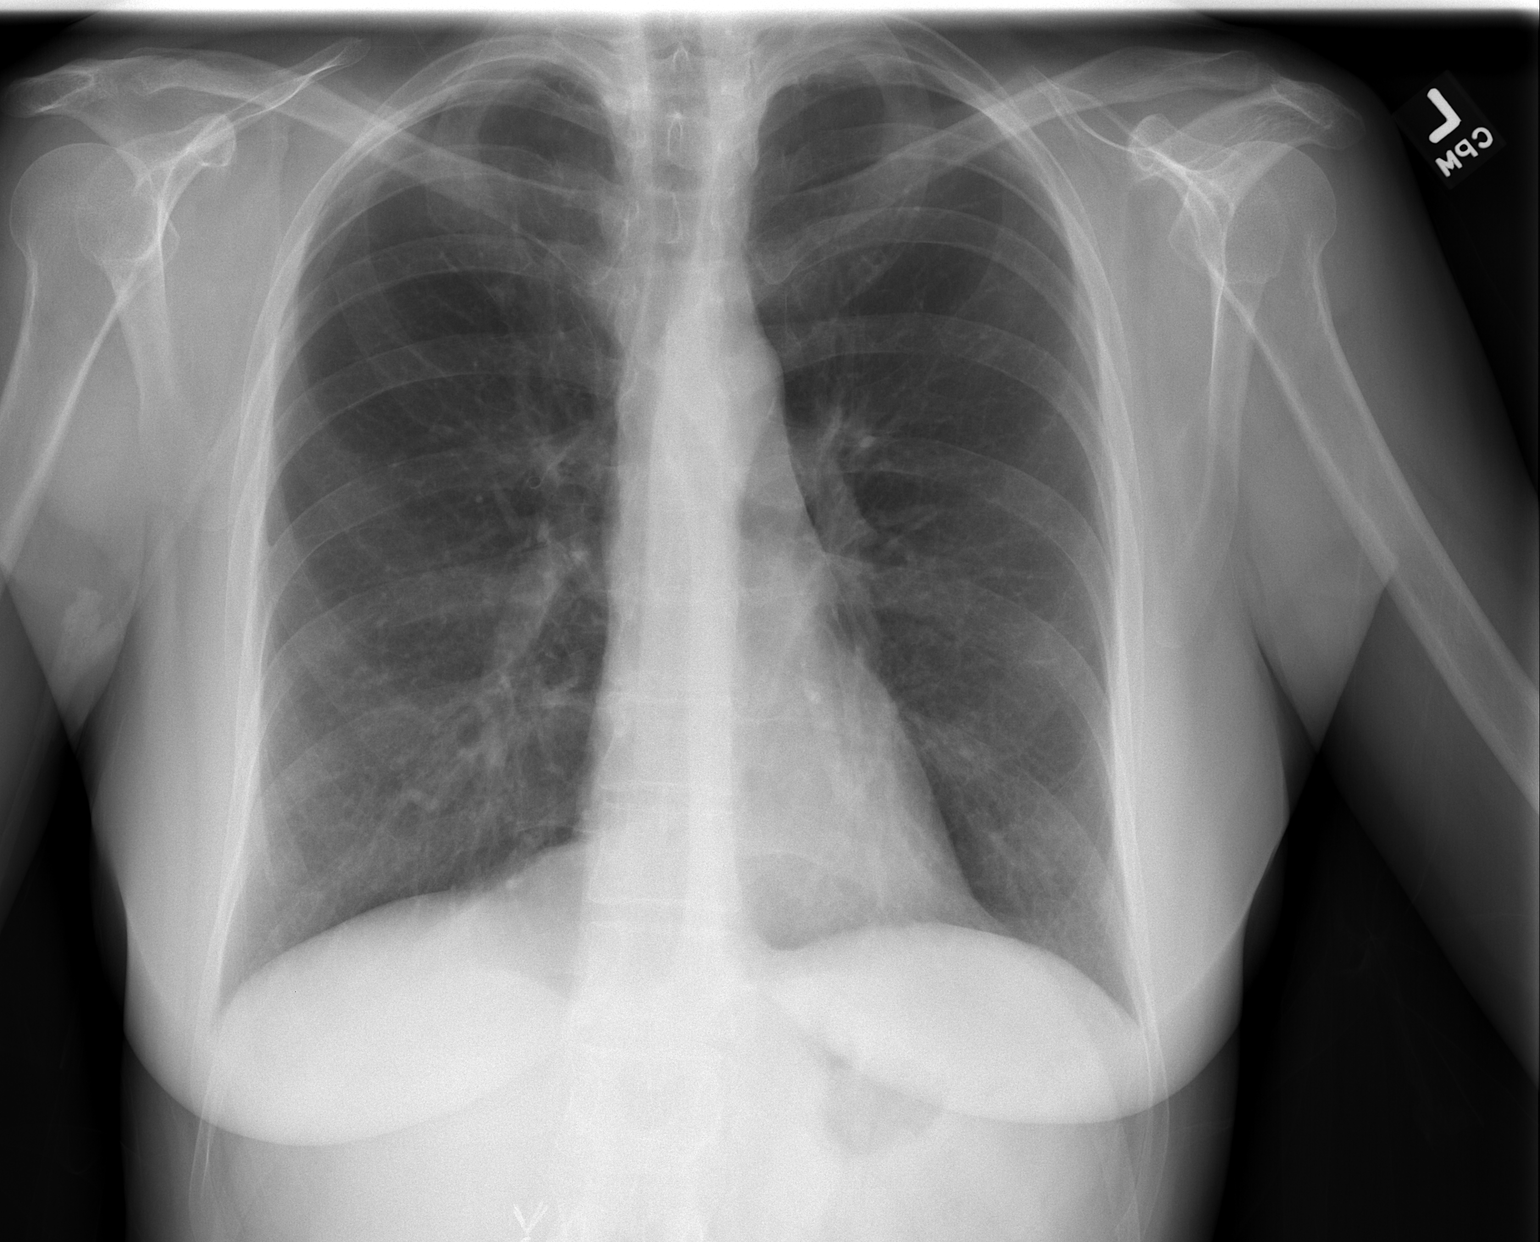

[w chest lat]
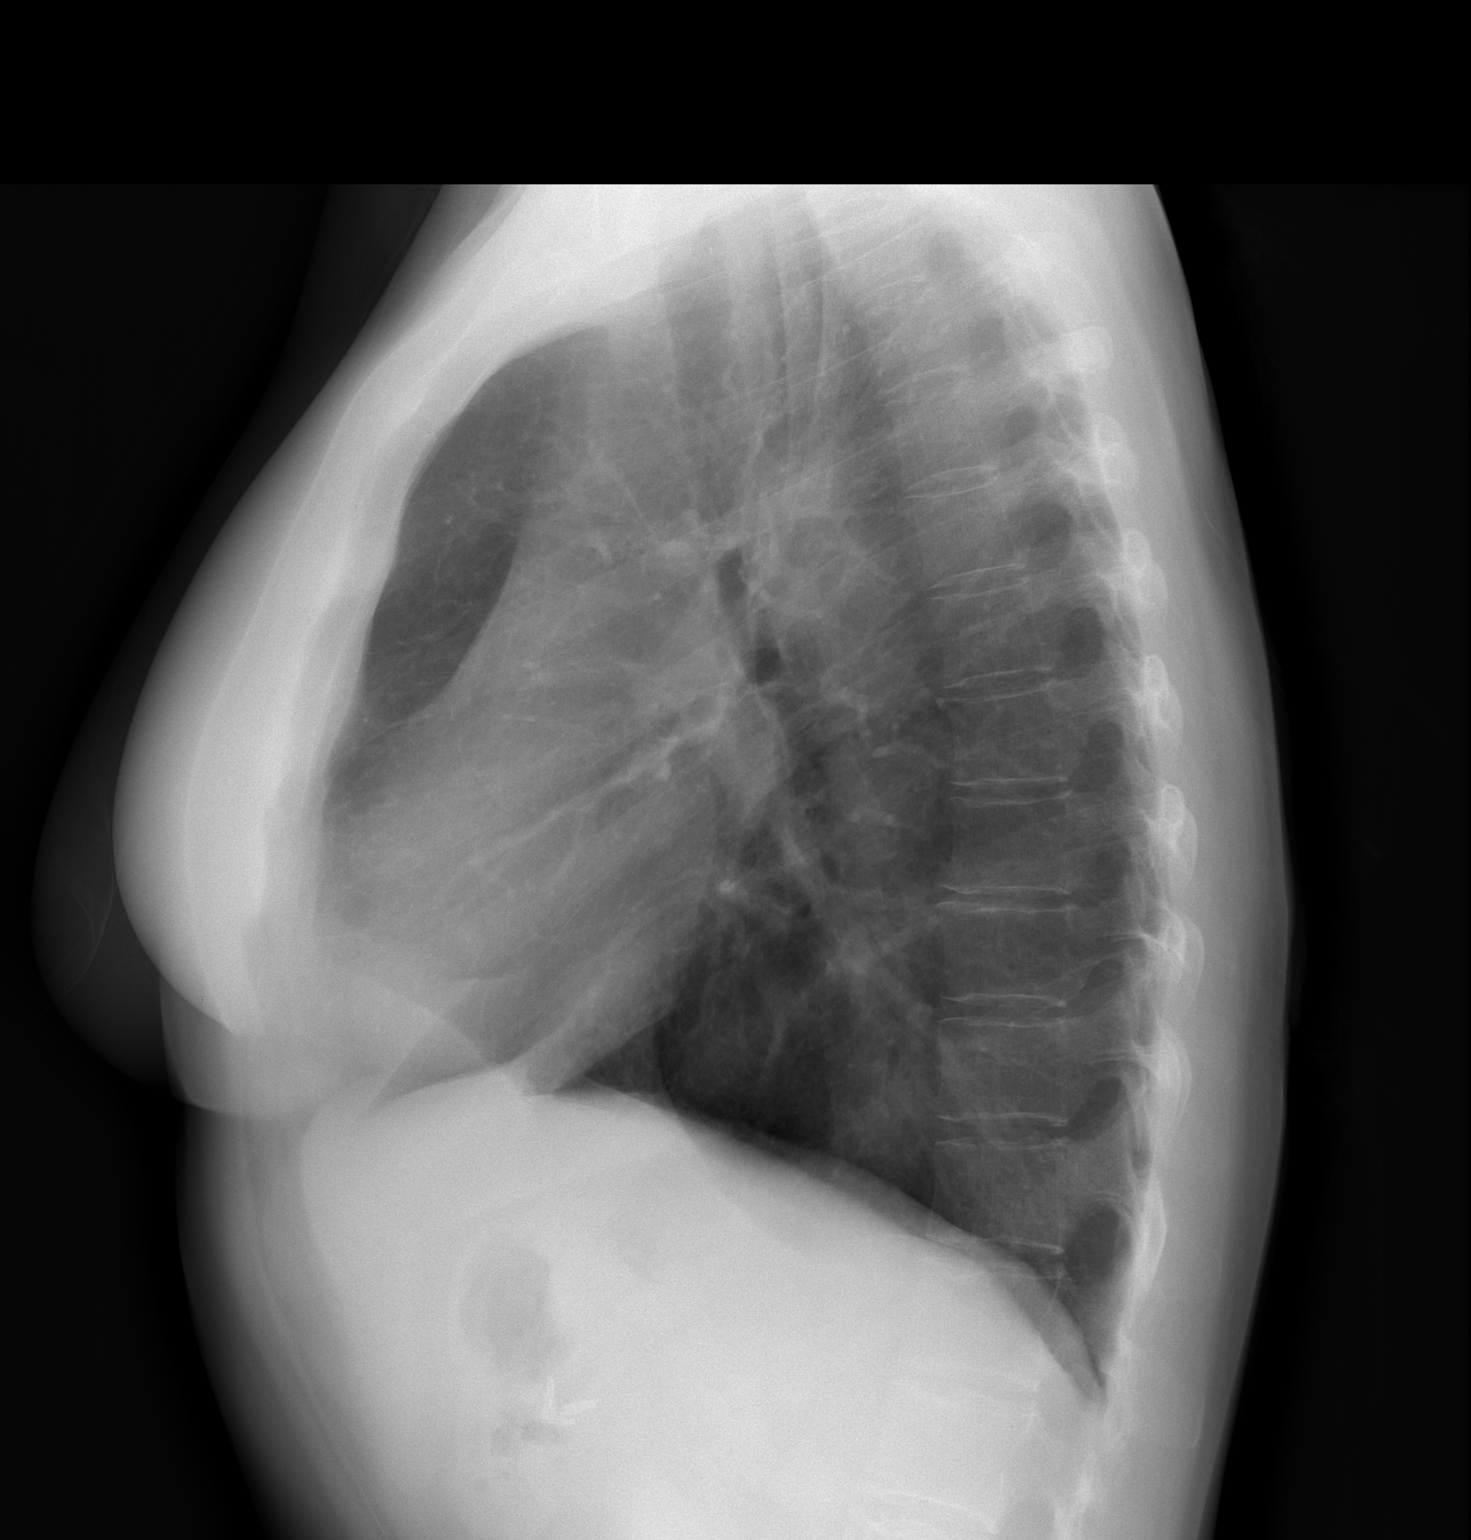

[2 of 2 positions shown; findings below may reference images not displayed]

FINDINGS: Tapering of the peripheral pulmonary vasculature is compatible with
emphysema. A 6 mm density at the inferior end of the right first rib appears to
likely represent superimposition of vascular and osseous shadows. However, this
could represent a small pulmonary nodule. Either CT scan of the chest or
followup chest radiography in 3 months' time is recommended.

The lungs appear otherwise clear.

IMPRESSION

1. Emphysema.
2. Possible 6 mm right upper lobe pulmonary nodule. I recommended the chest CT,
or followup chest radiography in 3 months' time to exclude the possibility of a
neoplastic pulmonary nodule.

## 2008-06-21 ENCOUNTER — Emergency Department (HOSPITAL_COMMUNITY): Admission: EM | Admit: 2008-06-21 | Discharge: 2008-06-21 | Payer: Self-pay | Admitting: Emergency Medicine

## 2008-06-21 IMAGING — CT CT CHEST W/O CM
1 of 2 series · 14 of 30 positions shown, 18 images · non-contrast
Comparison: Chest x-ray 04/09/06.

CLINICAL DATA: Possible lung nodule on chest x-ray.  Smoker.
 CHEST CT WITHOUT CONTRAST ? 04/28/06:
TECHNIQUE: Multidetector CT imaging of the chest was performed following the standard protocol without IV contrast.

[Series 2: chest_routine 5.0 b40f st · axial · 0.59mm/px · z∈[-18,+258]mm · 14 of 65 slices shown, 18 images]
[im 5/65  mediastinal]
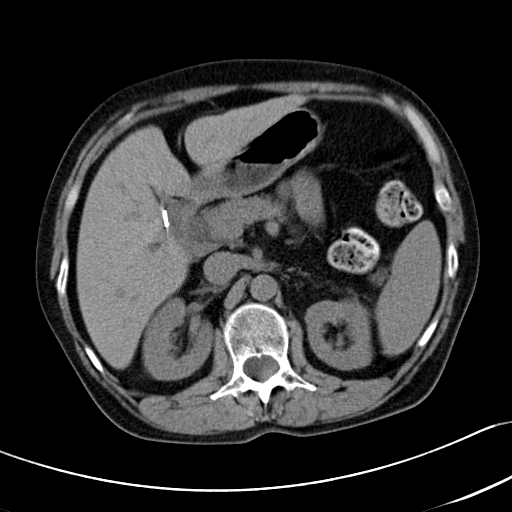
[im 5/65  lung]
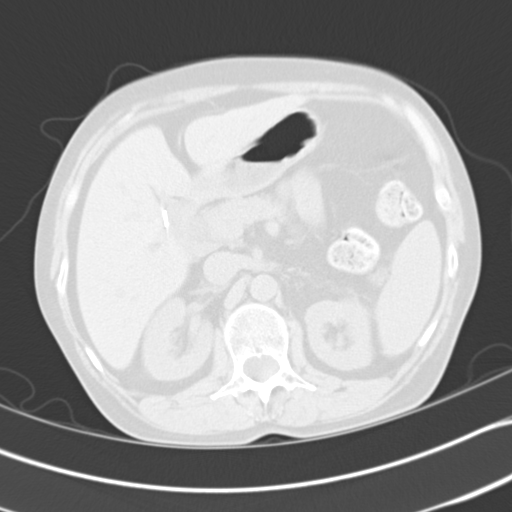
[im 10/65  lung]
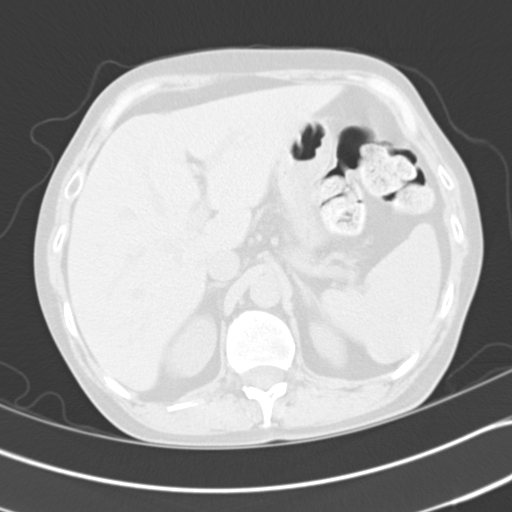
[im 14/65  lung]
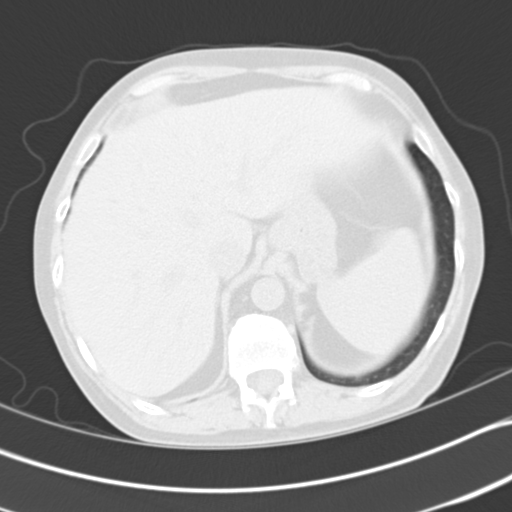
[im 19/65  lung]
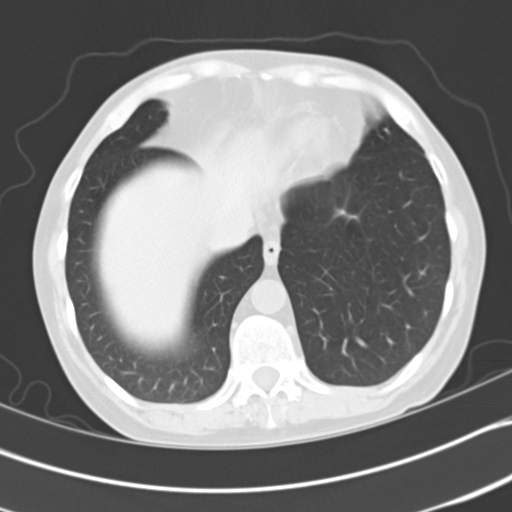
[im 23/65  mediastinal]
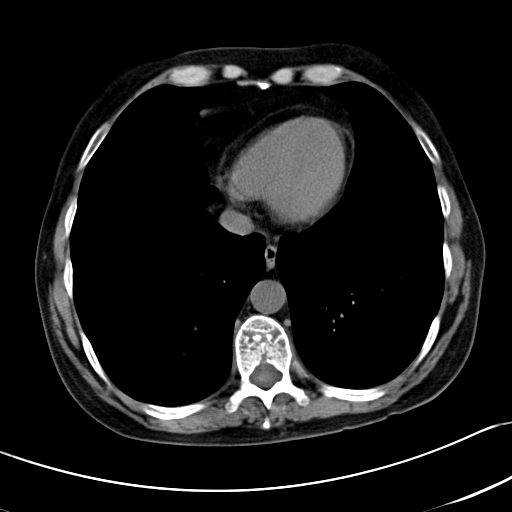
[im 23/65  lung]
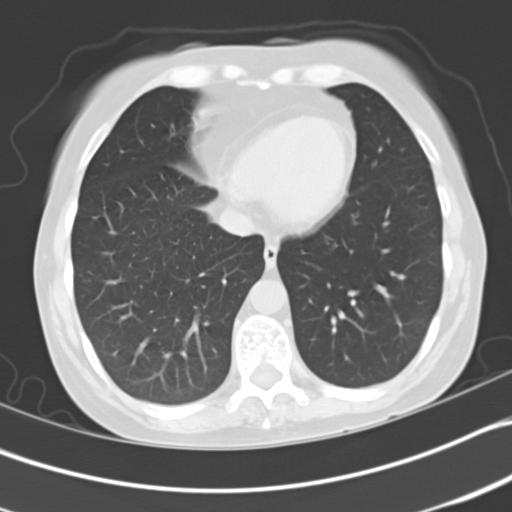
[im 28/65  lung]
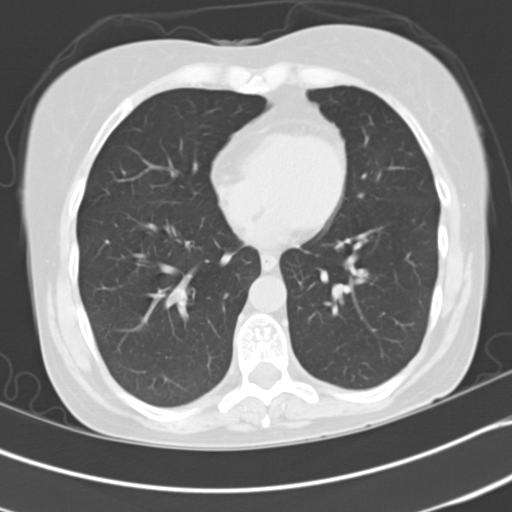
[im 31/65  lung]
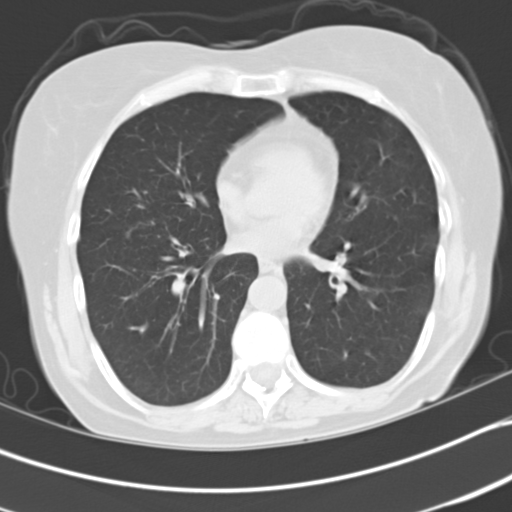
[im 33/65  lung]
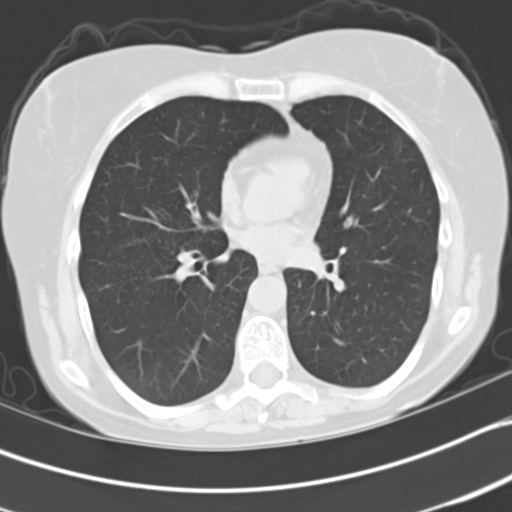
[im 37/65  mediastinal]
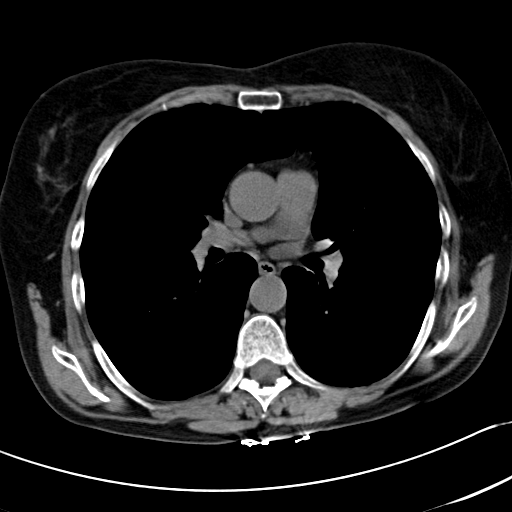
[im 37/65  lung]
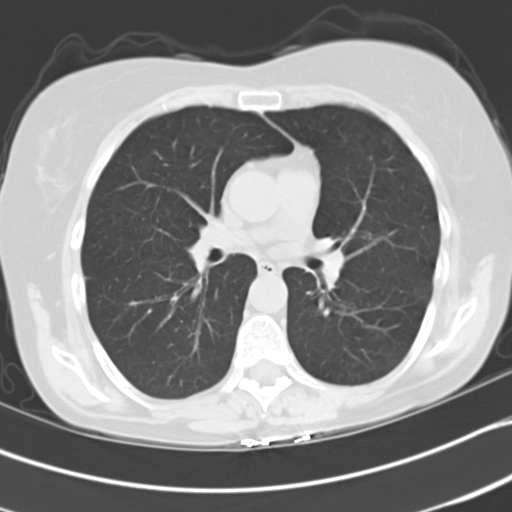
[im 42/65  lung]
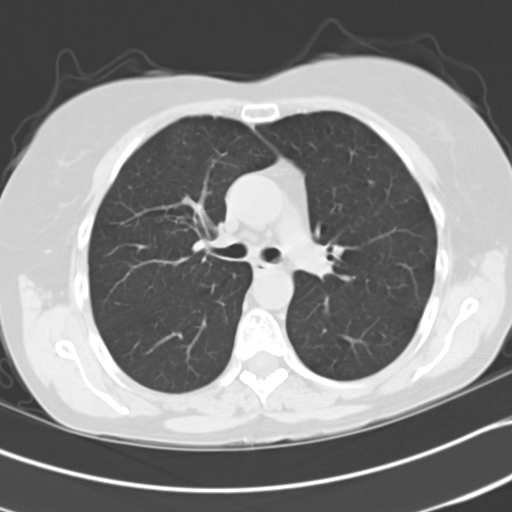
[im 46/65  lung]
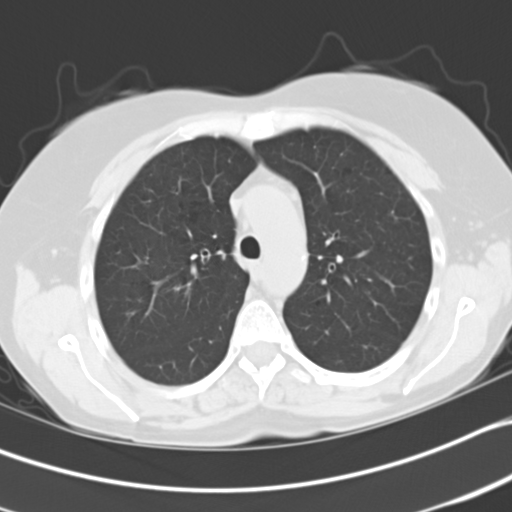
[im 51/65  lung]
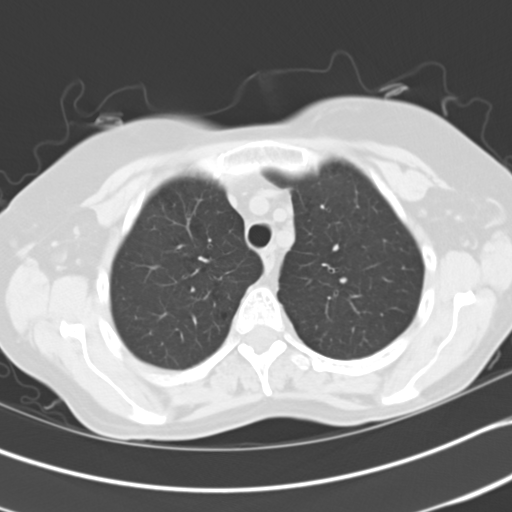
[im 55/65  mediastinal]
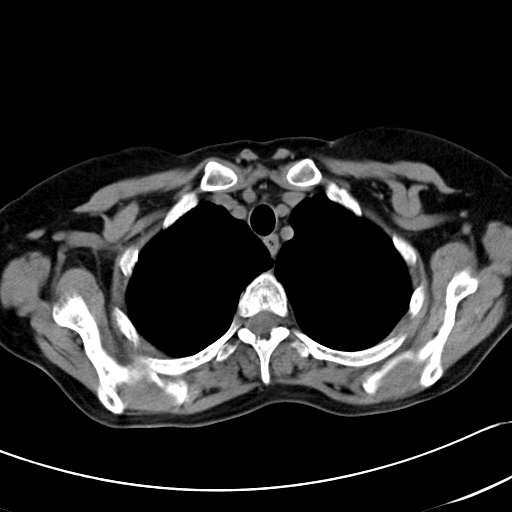
[im 55/65  lung]
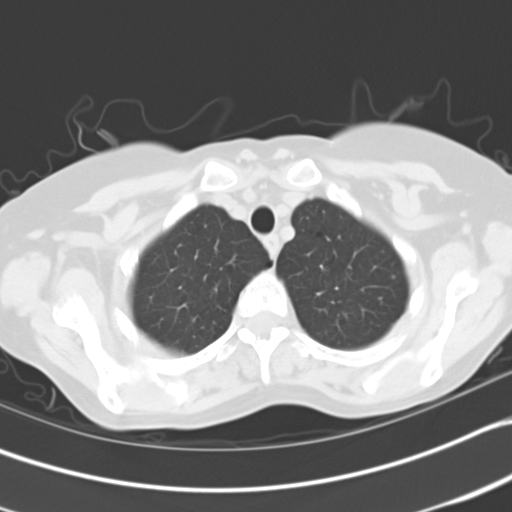
[im 60/65  lung]
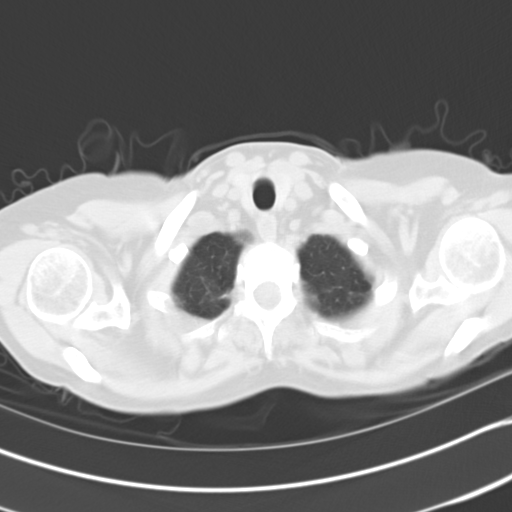

[14 of 30 positions shown; findings below may reference images not displayed]

FINDINGS: Small mediastinal lymph nodes do not meet CT size criteria for pathologic enlargement.  Heart size normal.  No pericardial fluid.  
 Biapical scar.  Centrilobular emphysema.  An oblong nodule along the right major fissure (image #34) likely represents a subpleural lymph node.  Mild bronchiectasis and bronchial wall thickening are seen centrally.  Focal mucoid impaction is seen in the left upper lobe (image #32).  Airway is otherwise unremarkable.  No pleural fluid.
 Incidental imaging of the upper abdomen shows no acute findings.  No worrisome lytic or sclerotic lesions.
IMPRESSION: No CT finding to correspond to abnormality on recent chest x-ray.

## 2008-07-07 ENCOUNTER — Ambulatory Visit (HOSPITAL_BASED_OUTPATIENT_CLINIC_OR_DEPARTMENT_OTHER): Admission: RE | Admit: 2008-07-07 | Discharge: 2008-07-07 | Payer: Self-pay | Admitting: Otolaryngology

## 2008-07-08 ENCOUNTER — Encounter: Admission: RE | Admit: 2008-07-08 | Discharge: 2008-07-08 | Payer: Self-pay | Admitting: Specialist

## 2008-08-31 ENCOUNTER — Emergency Department (HOSPITAL_COMMUNITY): Admission: EM | Admit: 2008-08-31 | Discharge: 2008-08-31 | Payer: Self-pay | Admitting: Emergency Medicine

## 2008-08-31 ENCOUNTER — Telehealth: Payer: Self-pay | Admitting: Internal Medicine

## 2008-09-01 ENCOUNTER — Telehealth: Payer: Self-pay | Admitting: Gastroenterology

## 2008-10-13 ENCOUNTER — Telehealth: Payer: Self-pay | Admitting: Gastroenterology

## 2008-10-14 ENCOUNTER — Ambulatory Visit (HOSPITAL_COMMUNITY): Admission: RE | Admit: 2008-10-14 | Discharge: 2008-10-14 | Payer: Self-pay | Admitting: General Surgery

## 2008-11-02 ENCOUNTER — Encounter (INDEPENDENT_AMBULATORY_CARE_PROVIDER_SITE_OTHER): Payer: Self-pay | Admitting: *Deleted

## 2008-11-02 ENCOUNTER — Telehealth: Payer: Self-pay | Admitting: Gastroenterology

## 2008-11-18 ENCOUNTER — Ambulatory Visit: Payer: Self-pay | Admitting: Gastroenterology

## 2008-11-18 DIAGNOSIS — J441 Chronic obstructive pulmonary disease with (acute) exacerbation: Secondary | ICD-10-CM | POA: Insufficient documentation

## 2008-11-18 DIAGNOSIS — J45909 Unspecified asthma, uncomplicated: Secondary | ICD-10-CM

## 2008-11-18 DIAGNOSIS — R112 Nausea with vomiting, unspecified: Secondary | ICD-10-CM

## 2008-11-18 DIAGNOSIS — K7689 Other specified diseases of liver: Secondary | ICD-10-CM

## 2008-11-18 DIAGNOSIS — E785 Hyperlipidemia, unspecified: Secondary | ICD-10-CM | POA: Insufficient documentation

## 2008-11-21 ENCOUNTER — Ambulatory Visit: Payer: Self-pay | Admitting: Gastroenterology

## 2008-11-21 ENCOUNTER — Encounter: Payer: Self-pay | Admitting: Gastroenterology

## 2008-11-22 IMAGING — CR DG CHEST 2V
2 series · 2 of 2 positions shown · non-contrast
Comparison: 06/18/06 plain film and 04/28/06 CT.

CLINICAL DATA: Fall last [REDACTED] with pain on her right side.  Shortness of breath.  Smoker.  
 CHEST - 2 VIEW:

[w chest pa]
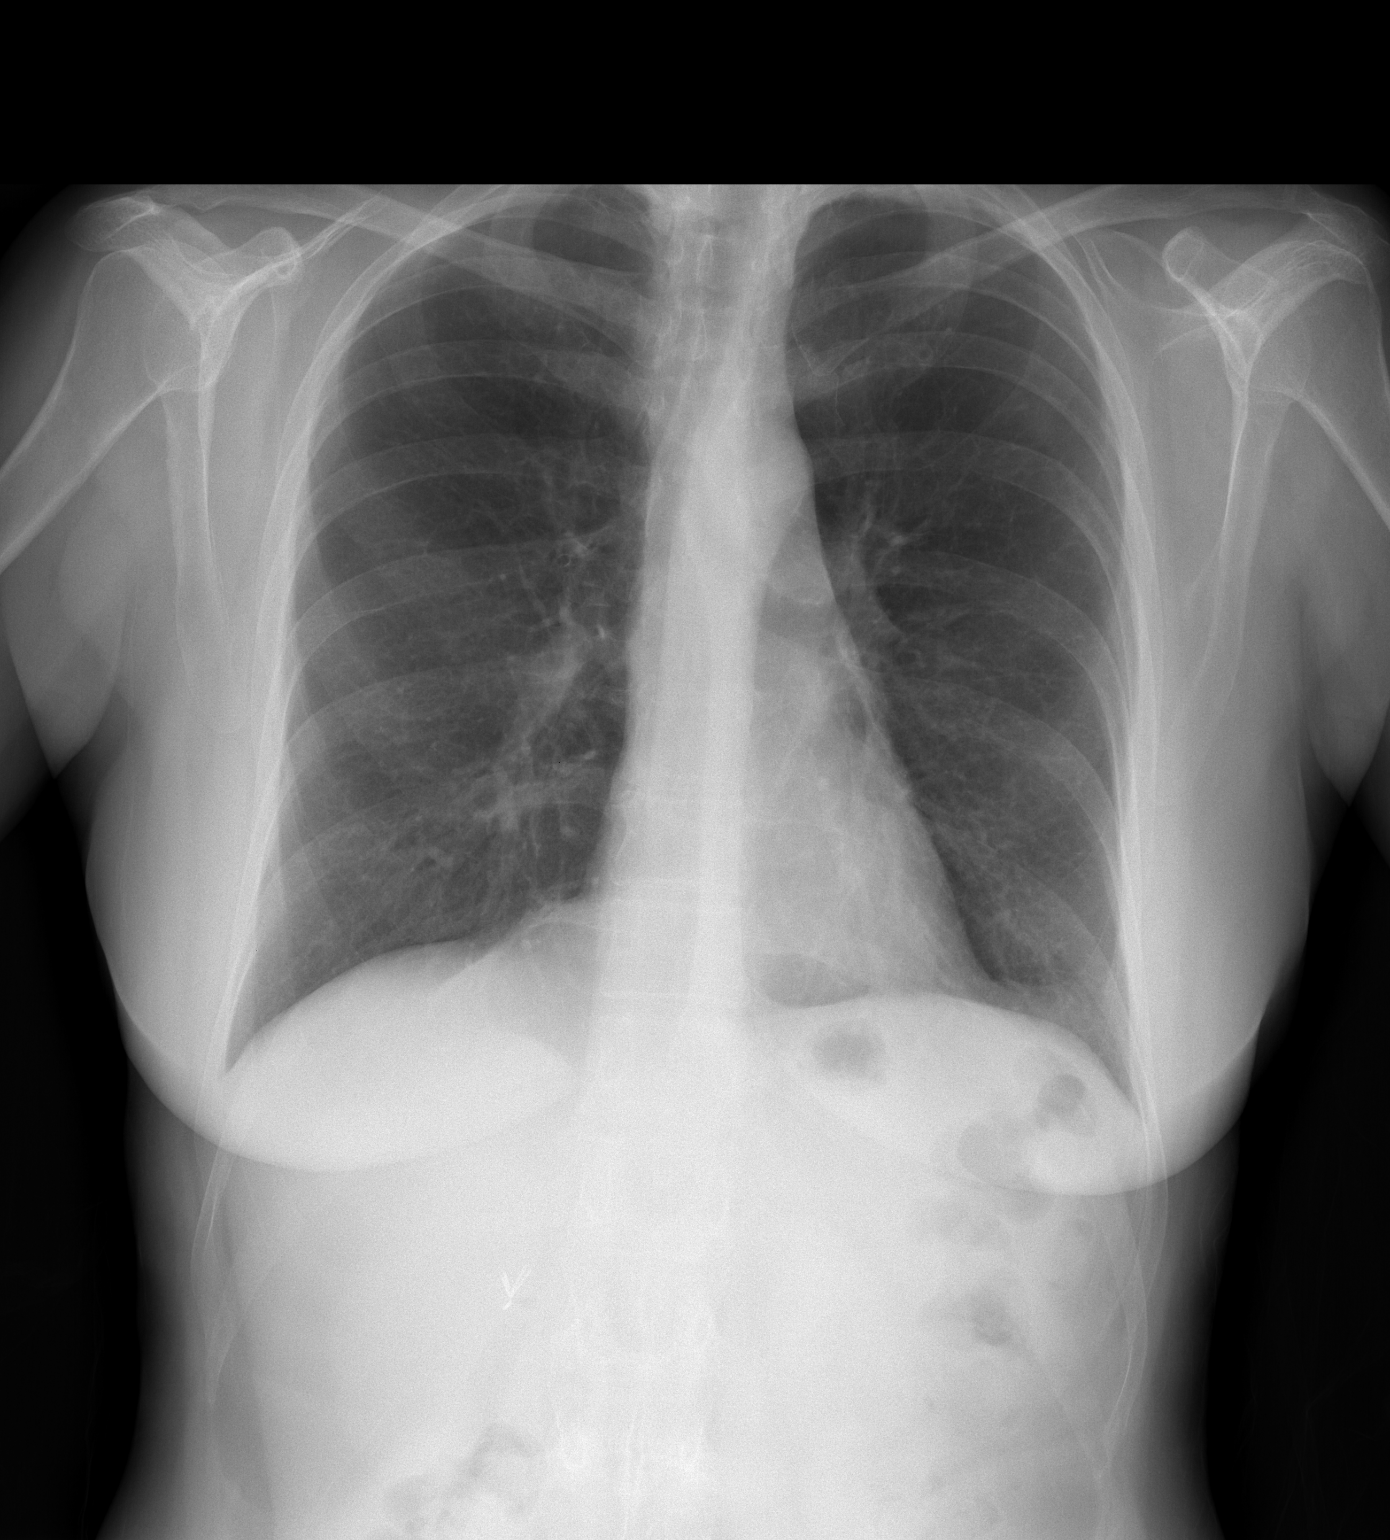

[w chest lat]
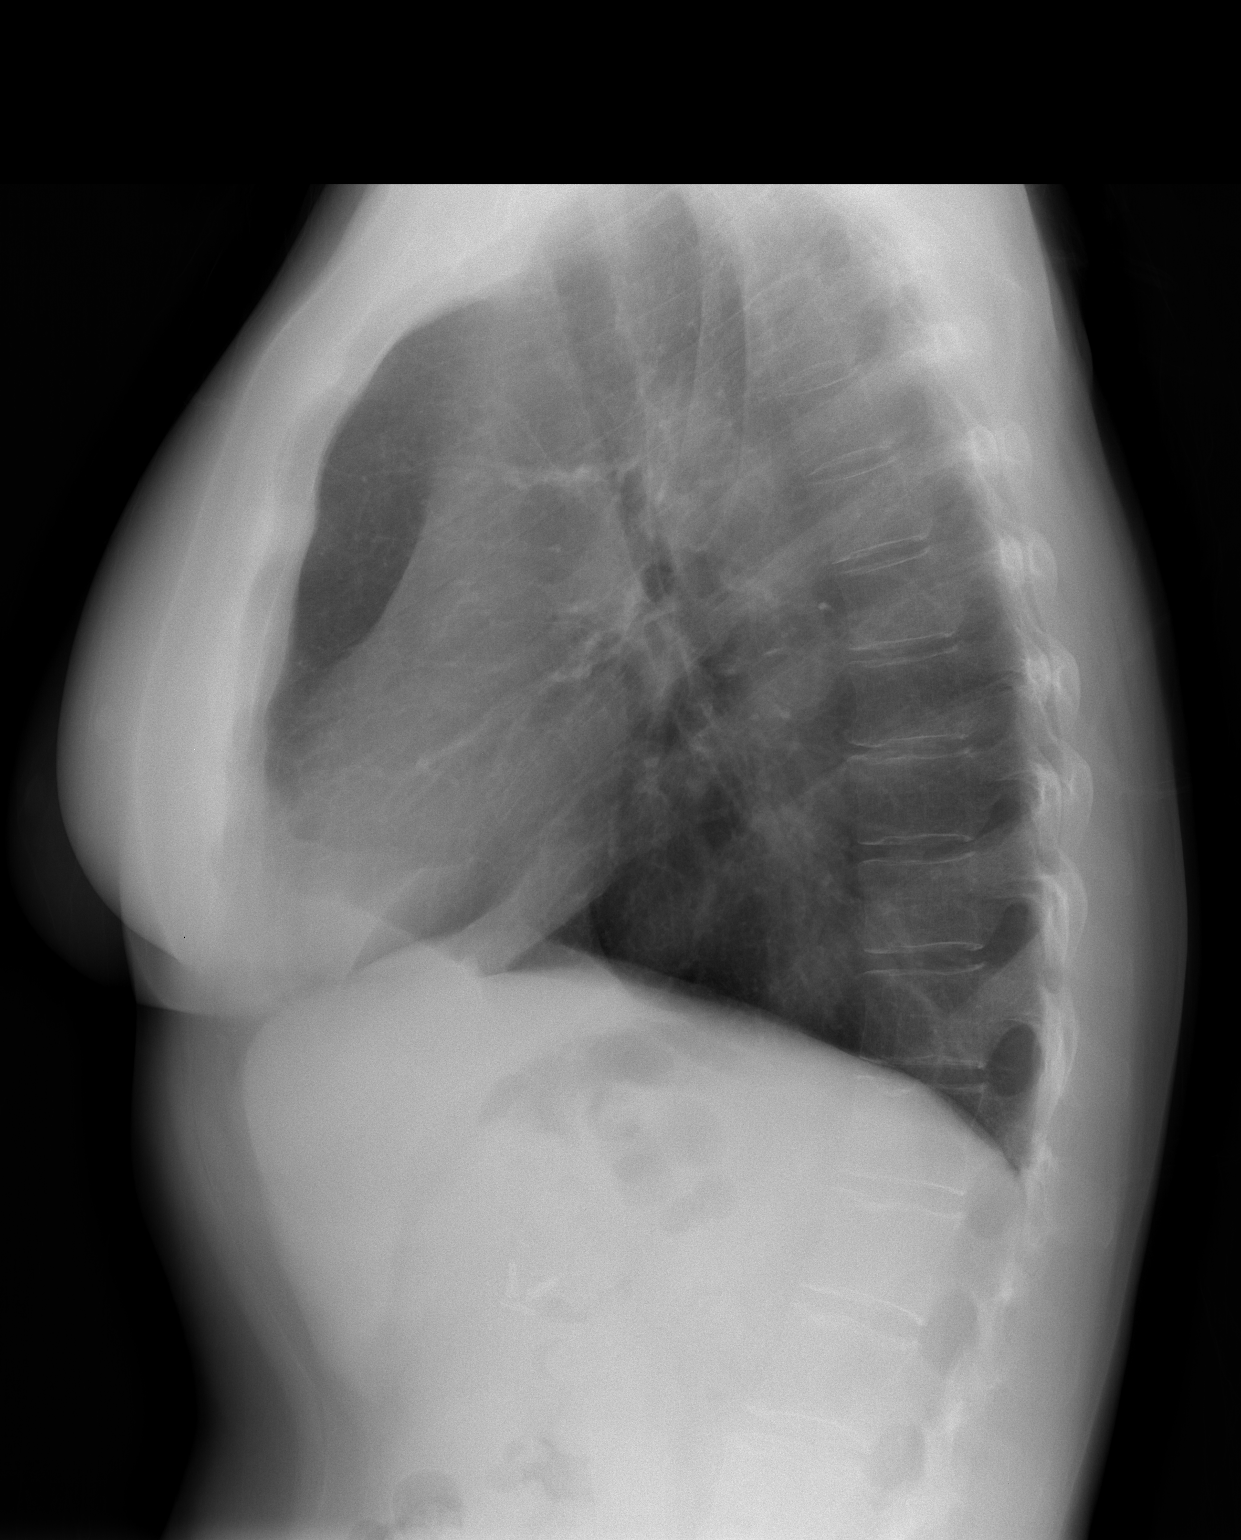

[2 of 2 positions shown; findings below may reference images not displayed]

FINDINGS: COPD.  Mild osteopenia. There are surgical changes in the low neck.  Trachea is midline.  The heart size is normal.  Mediastinal contours are within normal limits.  There is left apical pleural thickening.  The costophrenic angles are sharp.  There is no pneumothorax.  The lungs are clear.  Prior cholecystectomy.
IMPRESSION: COPD but no acute disease.

## 2008-11-23 ENCOUNTER — Encounter: Payer: Self-pay | Admitting: Gastroenterology

## 2008-11-23 ENCOUNTER — Telehealth: Payer: Self-pay | Admitting: Gastroenterology

## 2008-11-24 ENCOUNTER — Telehealth: Payer: Self-pay | Admitting: Gastroenterology

## 2008-11-28 ENCOUNTER — Ambulatory Visit (HOSPITAL_COMMUNITY): Admission: RE | Admit: 2008-11-28 | Discharge: 2008-11-28 | Payer: Self-pay | Admitting: Gastroenterology

## 2008-11-29 ENCOUNTER — Telehealth: Payer: Self-pay | Admitting: Gastroenterology

## 2008-12-01 ENCOUNTER — Telehealth: Payer: Self-pay | Admitting: Gastroenterology

## 2008-12-05 ENCOUNTER — Telehealth: Payer: Self-pay | Admitting: Gastroenterology

## 2008-12-22 IMAGING — CR DG HAND EXAM
1 series · 6 of 6 positions shown · non-contrast
Comparison: none

[Series 1: view not recorded · 0.17mm/px · 6 of 6 slices shown]
[im 1/6]
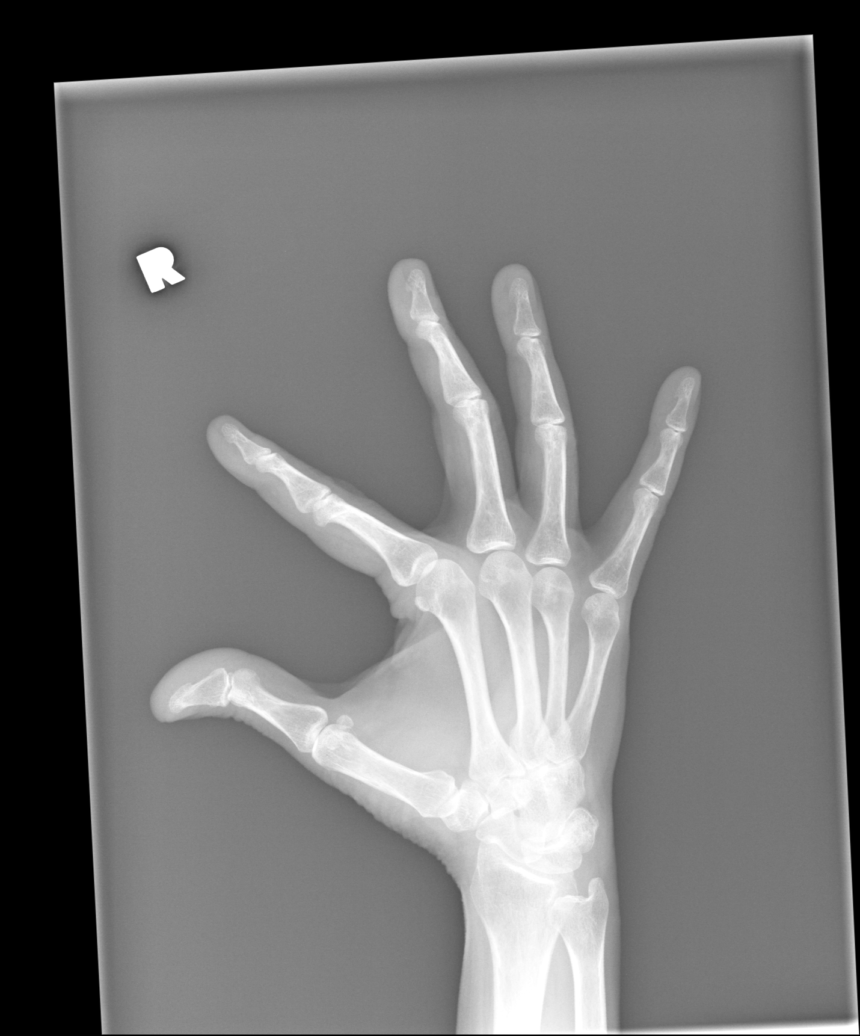
[im 2/6]
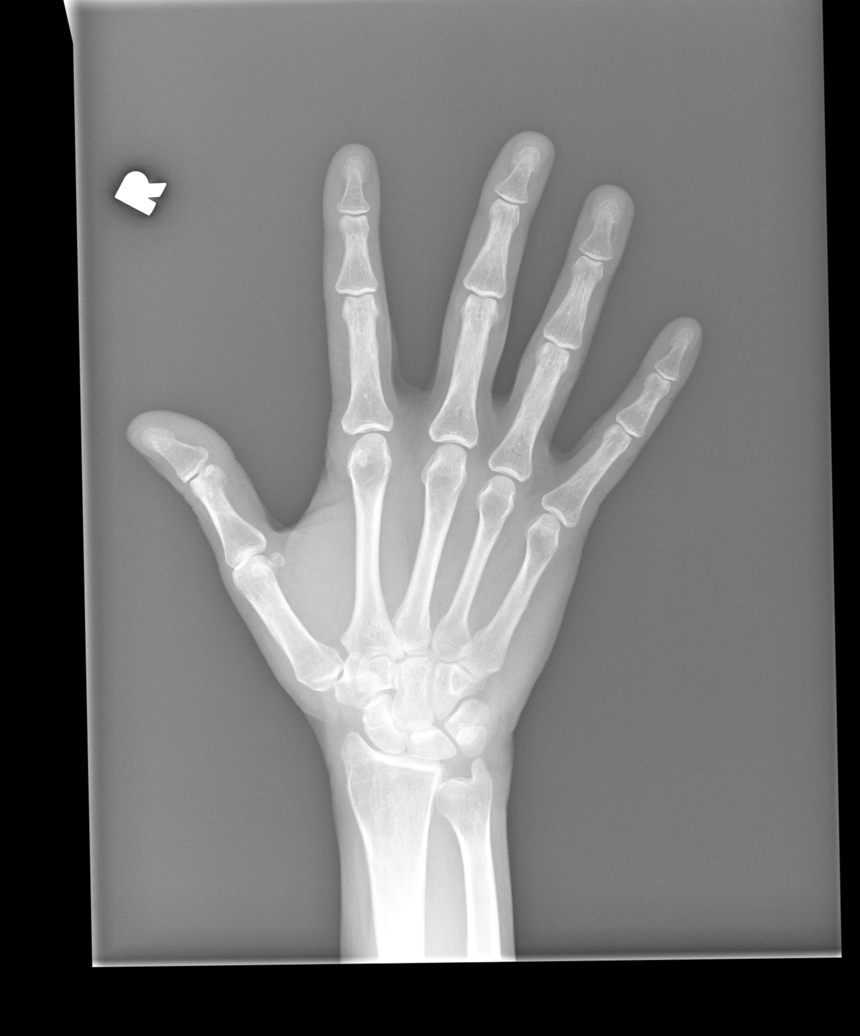
[im 3/6]
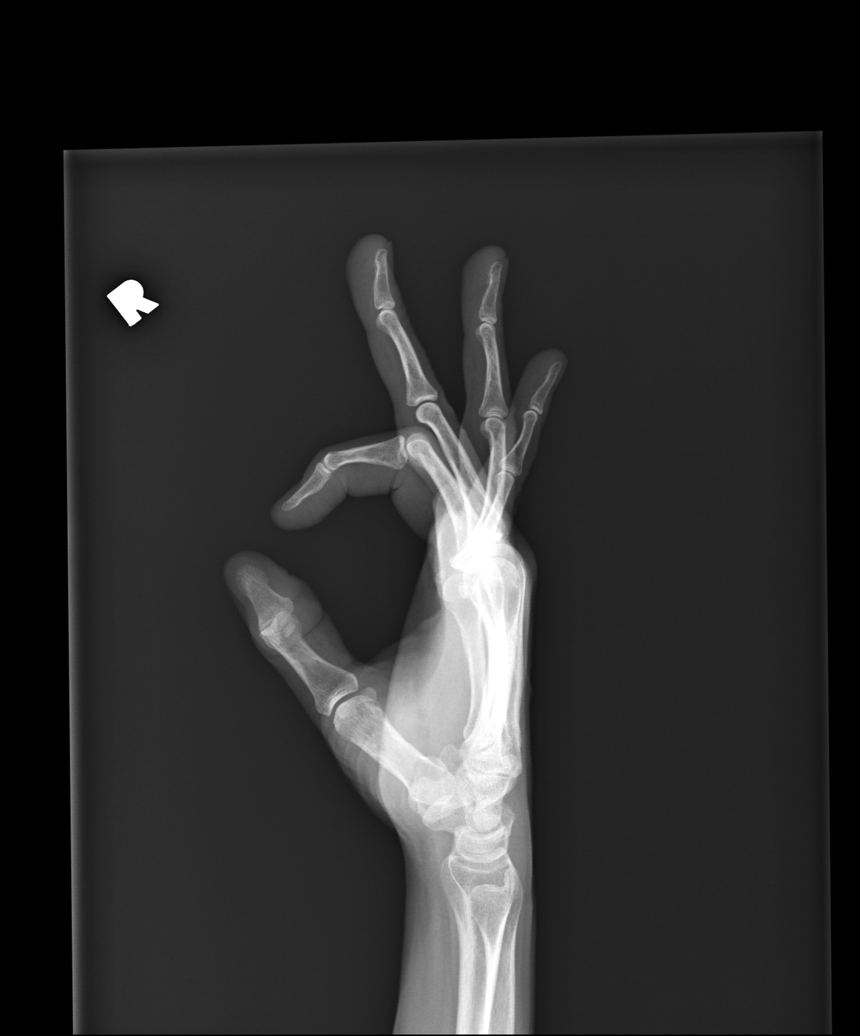
[im 4/6]
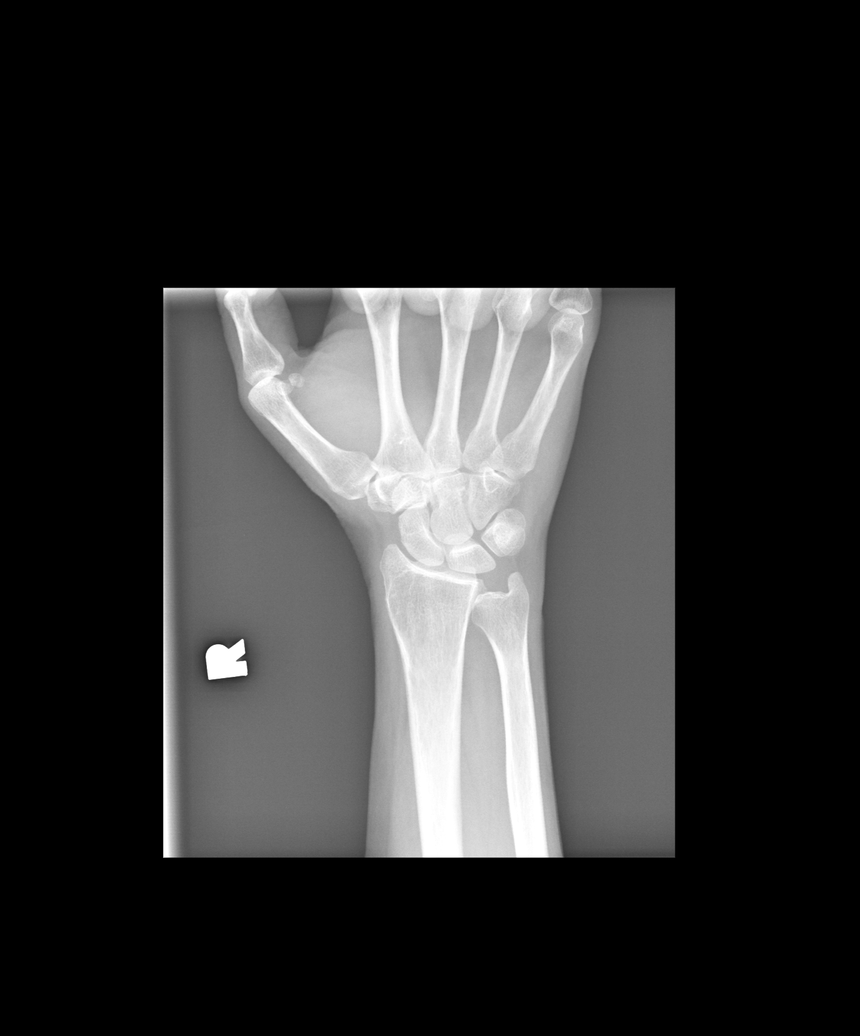
[im 5/6]
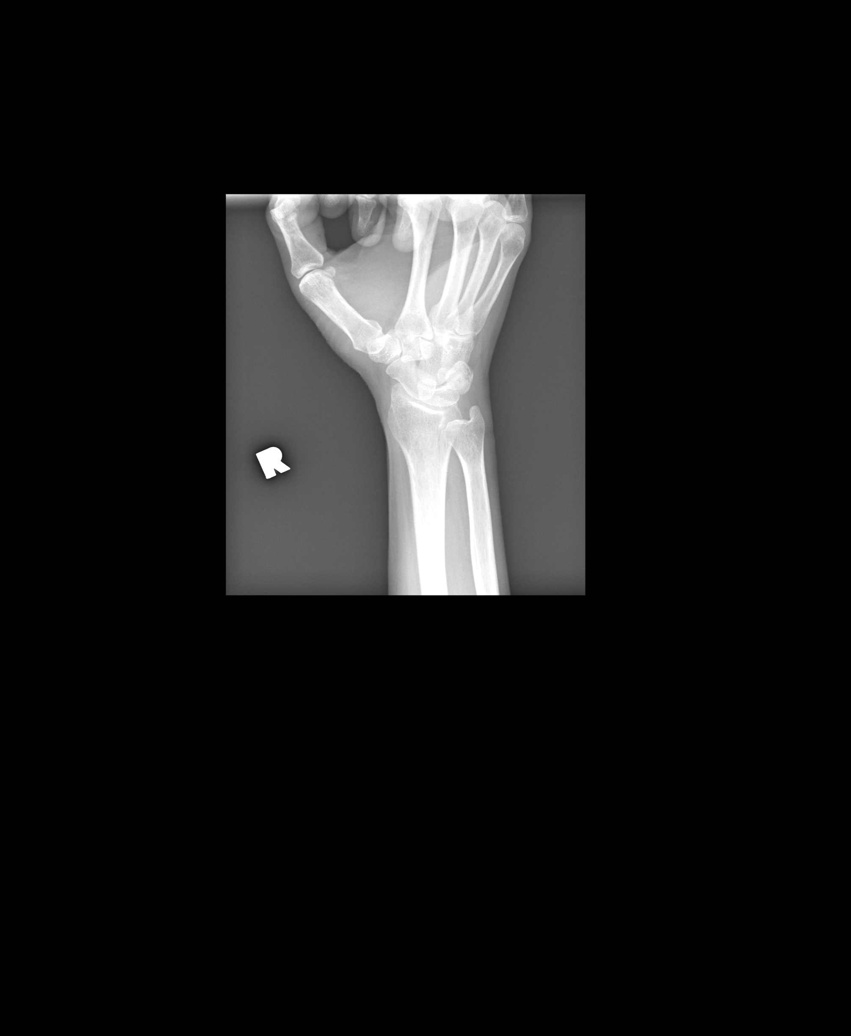
[im 6/6]
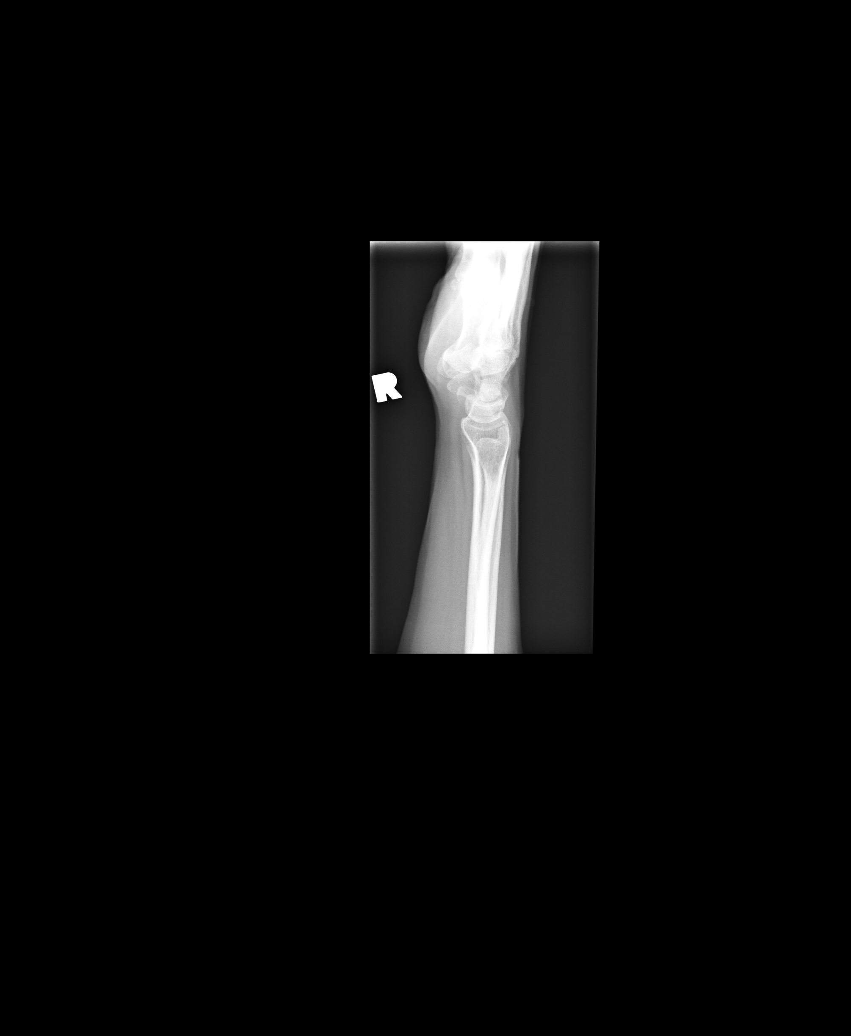

[6 of 6 positions shown; findings below may reference images not displayed]

Canned report from images found in remote index.

Refer to host system for actual result text.

## 2008-12-29 IMAGING — CT CT PELVIS W/ CM
2 of 5 series · 16 of 46 positions shown, 18 images · IV contrast (omnipaque)
Comparison: none

CLINICAL DATA: Right lower quadrant pain.  History of TAH.  Ovaries gone.  Status post cholecystectomy.  
ABDOMEN CT WITH CONTRAST:
TECHNIQUE: Multidetector CT imaging of the abdomen was performed following the standard protocol during bolus administration of intravenous contrast.
Contrast:  125 cc Omnipaque 300
TECHNIQUE: Multidetector CT imaging of the pelvis was performed following the standard protocol during bolus administration of intravenous contrast.

[Series 2: abd_pel 5.0 b40s · axial · 0.66mm/px · z∈[+794,+1204]mm · 13 of 92 slices shown, 15 images]
[im 5/92  soft-tissue]
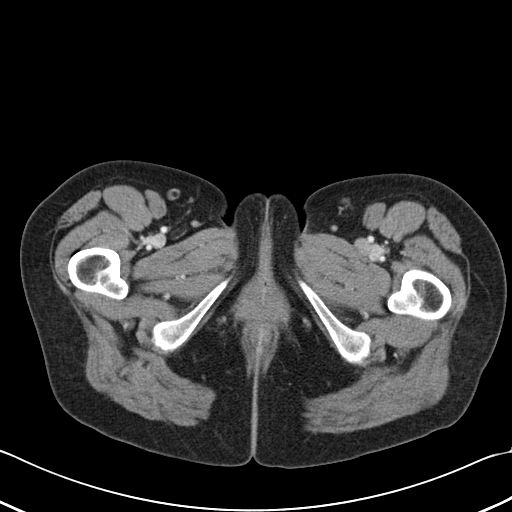
[im 5/92  bone]
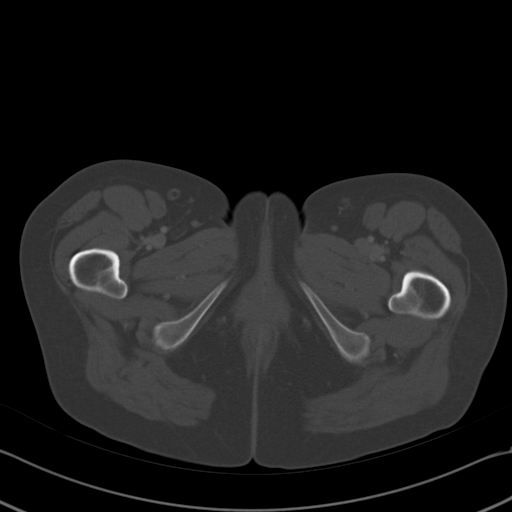
[im 14/92  soft-tissue]
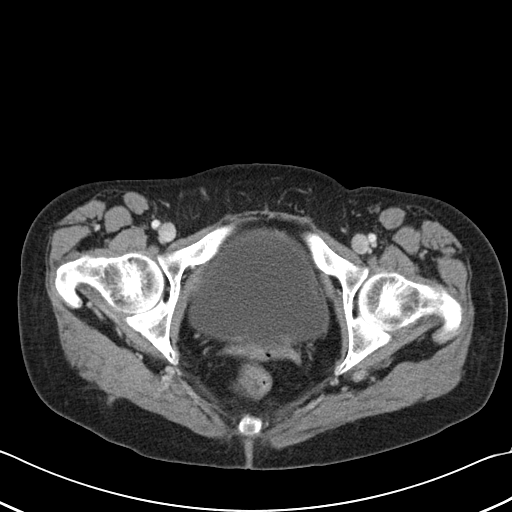
[im 18/92  soft-tissue]
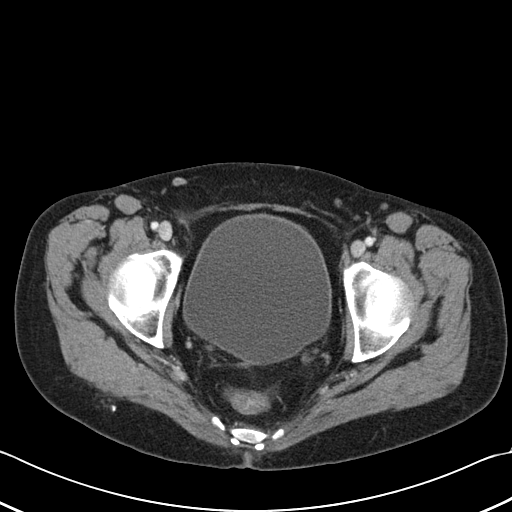
[im 27/92  soft-tissue]
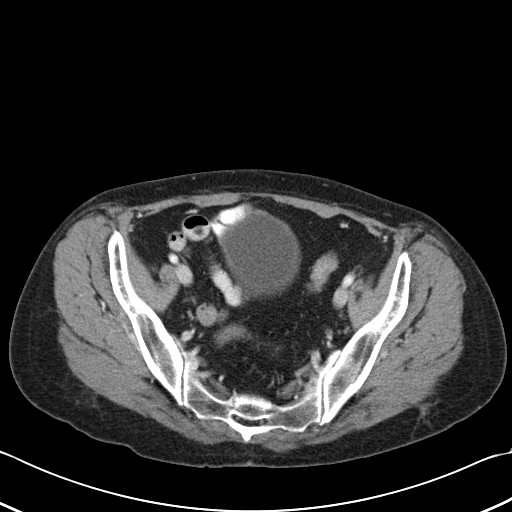
[im 31/92  soft-tissue]
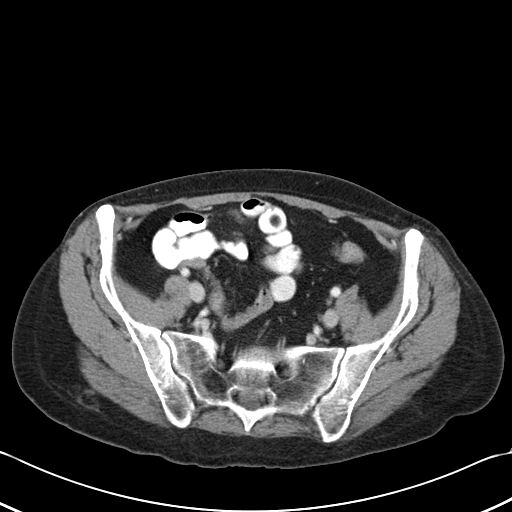
[im 40/92  soft-tissue]
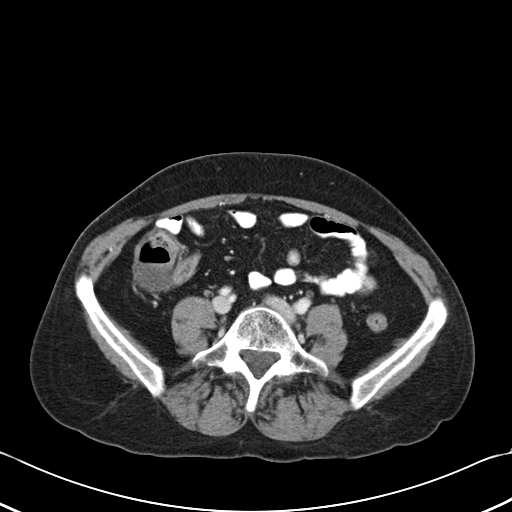
[im 48/92  soft-tissue]
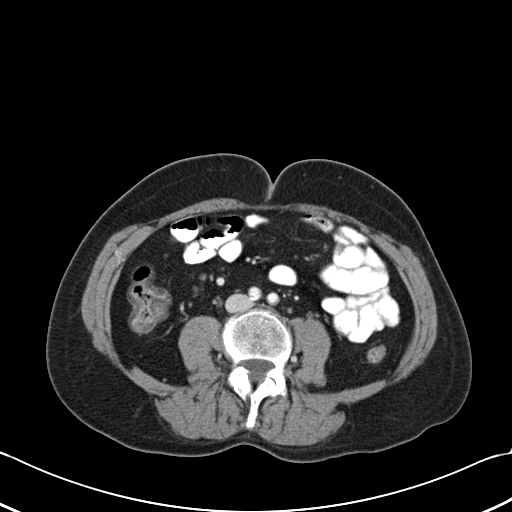
[im 53/92  soft-tissue]
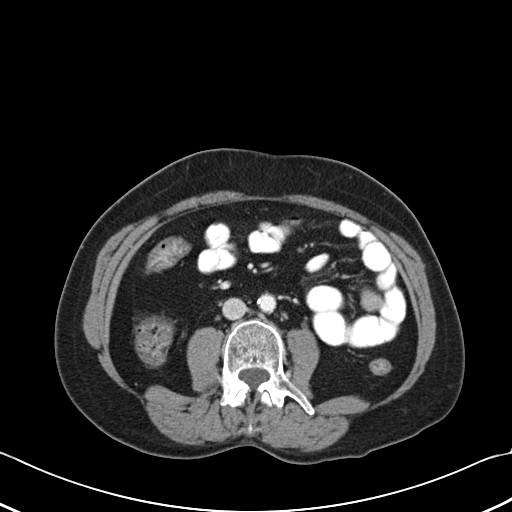
[im 61/92  soft-tissue]
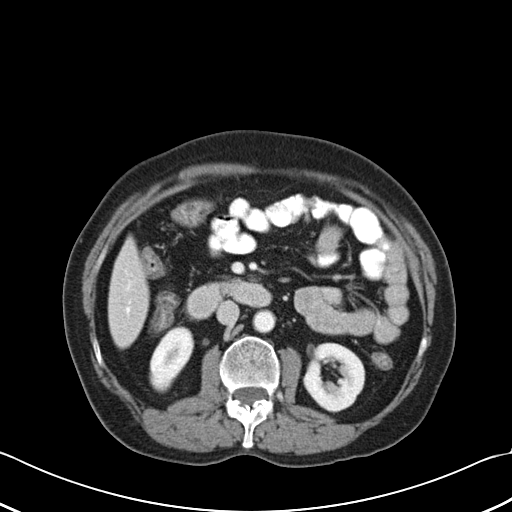
[im 61/92  bone]
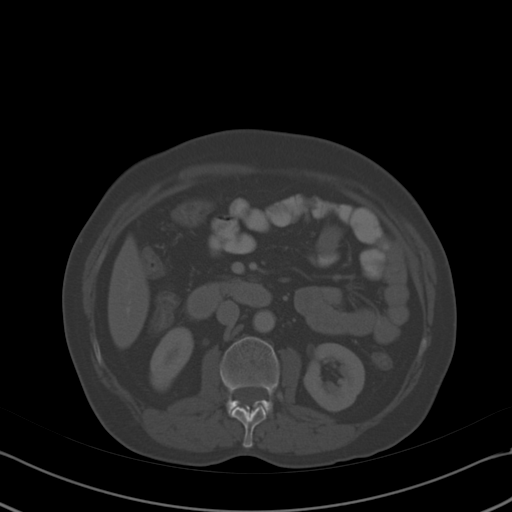
[im 66/92  soft-tissue]
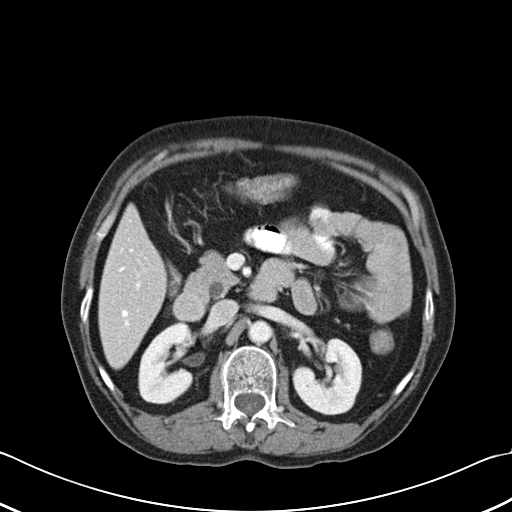
[im 74/92  soft-tissue]
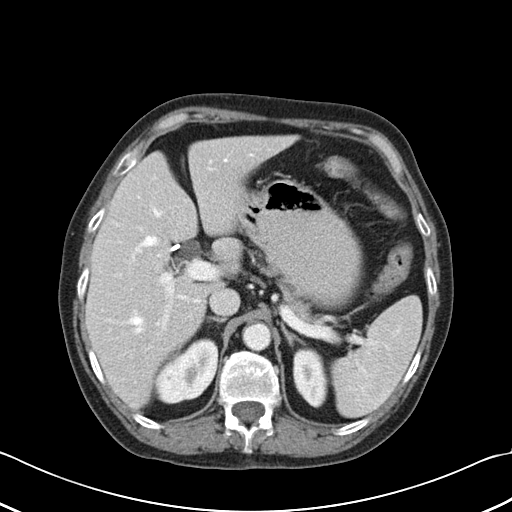
[im 79/92  soft-tissue]
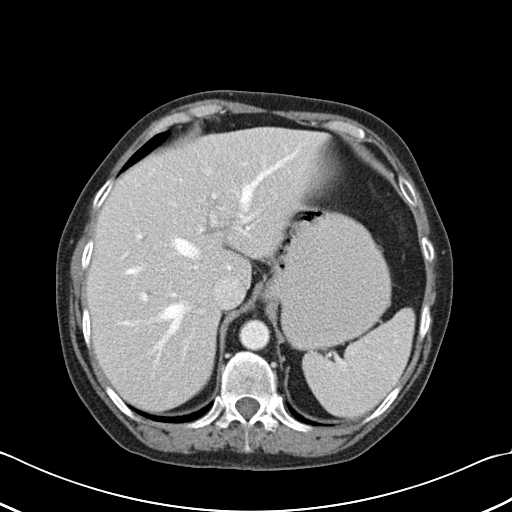
[im 87/92  soft-tissue]
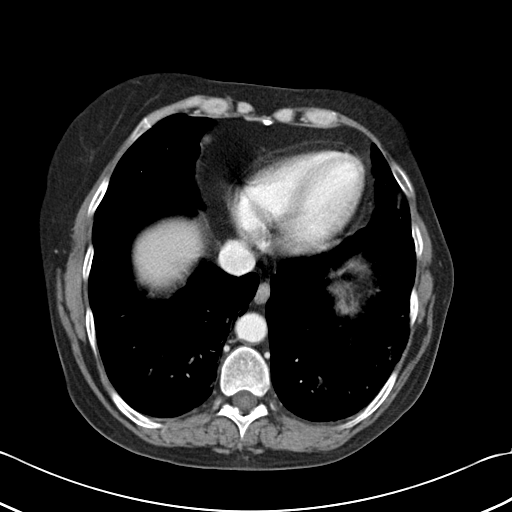

[Series 602: <mpr thick range> · coronal · 0.90mm/px · 3 of 67 slices shown]
[im 23/67  soft-tissue]
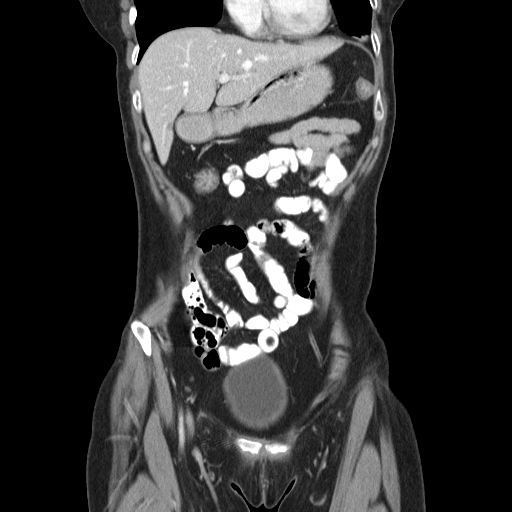
[im 30/67  soft-tissue]
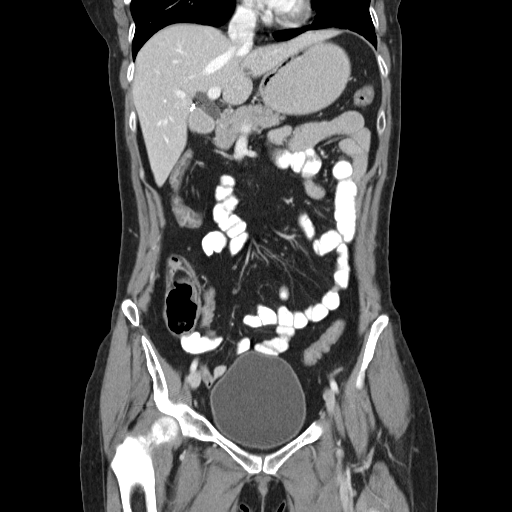
[im 37/67  soft-tissue]
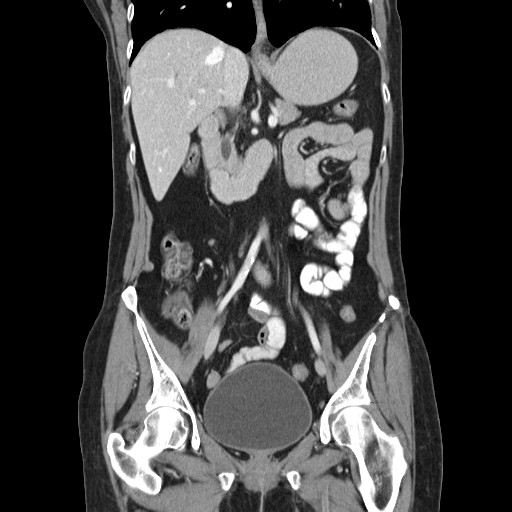

[16 of 46 positions shown; findings below may reference images not displayed]

FINDINGS: The lung bases are clear.  No pleural or pericardial effusion.
Within the left lobe of the liver there is a focal area of low density which measures 12.5mm.  This is unchanged compared with prior exam.  The remaining portions of the liver parenchyma are normal.  No intrahepatic biliary ductal dilatation.  Patient is status post cholecystectomy.  The common bile duct is increased in caliber measuring 12mm.  This is similar to the prior exam from 9445.  This may represent a distal stone, stricture, or a small mass.  Of note, no mass is seen within the pancreatic parenchyma.
Both adrenal glands are normal.  
The right kidney is normal.  The left kidney is normal.  No retroperitoneal lymphadenopathy is noted.  There is no evidence for small bowel mesenteric adenopathy.  
The visualized small bowel loops are normal in course and in caliber.  
The large bowel loops are collapsed.  There is mild thickening involving the colon however, this may simply reflect incomplete distention.  An early pancolitis is not excluded and clinical correlation is recommended.  No abnormal fluid is seen within the peritoneal cavity.  There are no abnormal fluid collections or masses noted.
The portal vein is patent.  
Review of the bone windows is unremarkable.
IMPRESSION: 1.  The colonic wall is diffusely prominent.  This may represent mild diffuse edema due to early pancolitis.  However, note that the bowel is collapsed and thus incomplete distention can accentuate bowel wall thickening.  Clinical correlation with any signs or symptoms of colitis would be recommended.
2.  Status post cholecystectomy with increased caliber of the common bile duct.
3.  Stable low density lesion in left lobe of liver.
PELVIS CT WITH CONTRAST:
FINDINGS: There is no free fluid.  The urinary bladder is negative.  No pelvic or unguinal adenopathy noted.  There are no masses or abnormal fluid collections within the pelvis.  Sigmoid colon and rectum are collapsed.  There is accentuation of the bowel wall which may be due to incomplete distention less likely edema from colitis.  
Review of bone windows is unremarkable.
IMPRESSION: 1.  Prominent colonic wall which may be due to incomplete distention.  Less likely this may represent mild edema from colitis.  Clinical correlation recommended.

## 2009-01-03 IMAGING — CR DG CHEST 2V
1 series · 2 of 2 positions shown · non-contrast
Comparison: none

[Series 1: view not recorded · 0.17mm/px · 2 of 2 slices shown]
[im 1/2]
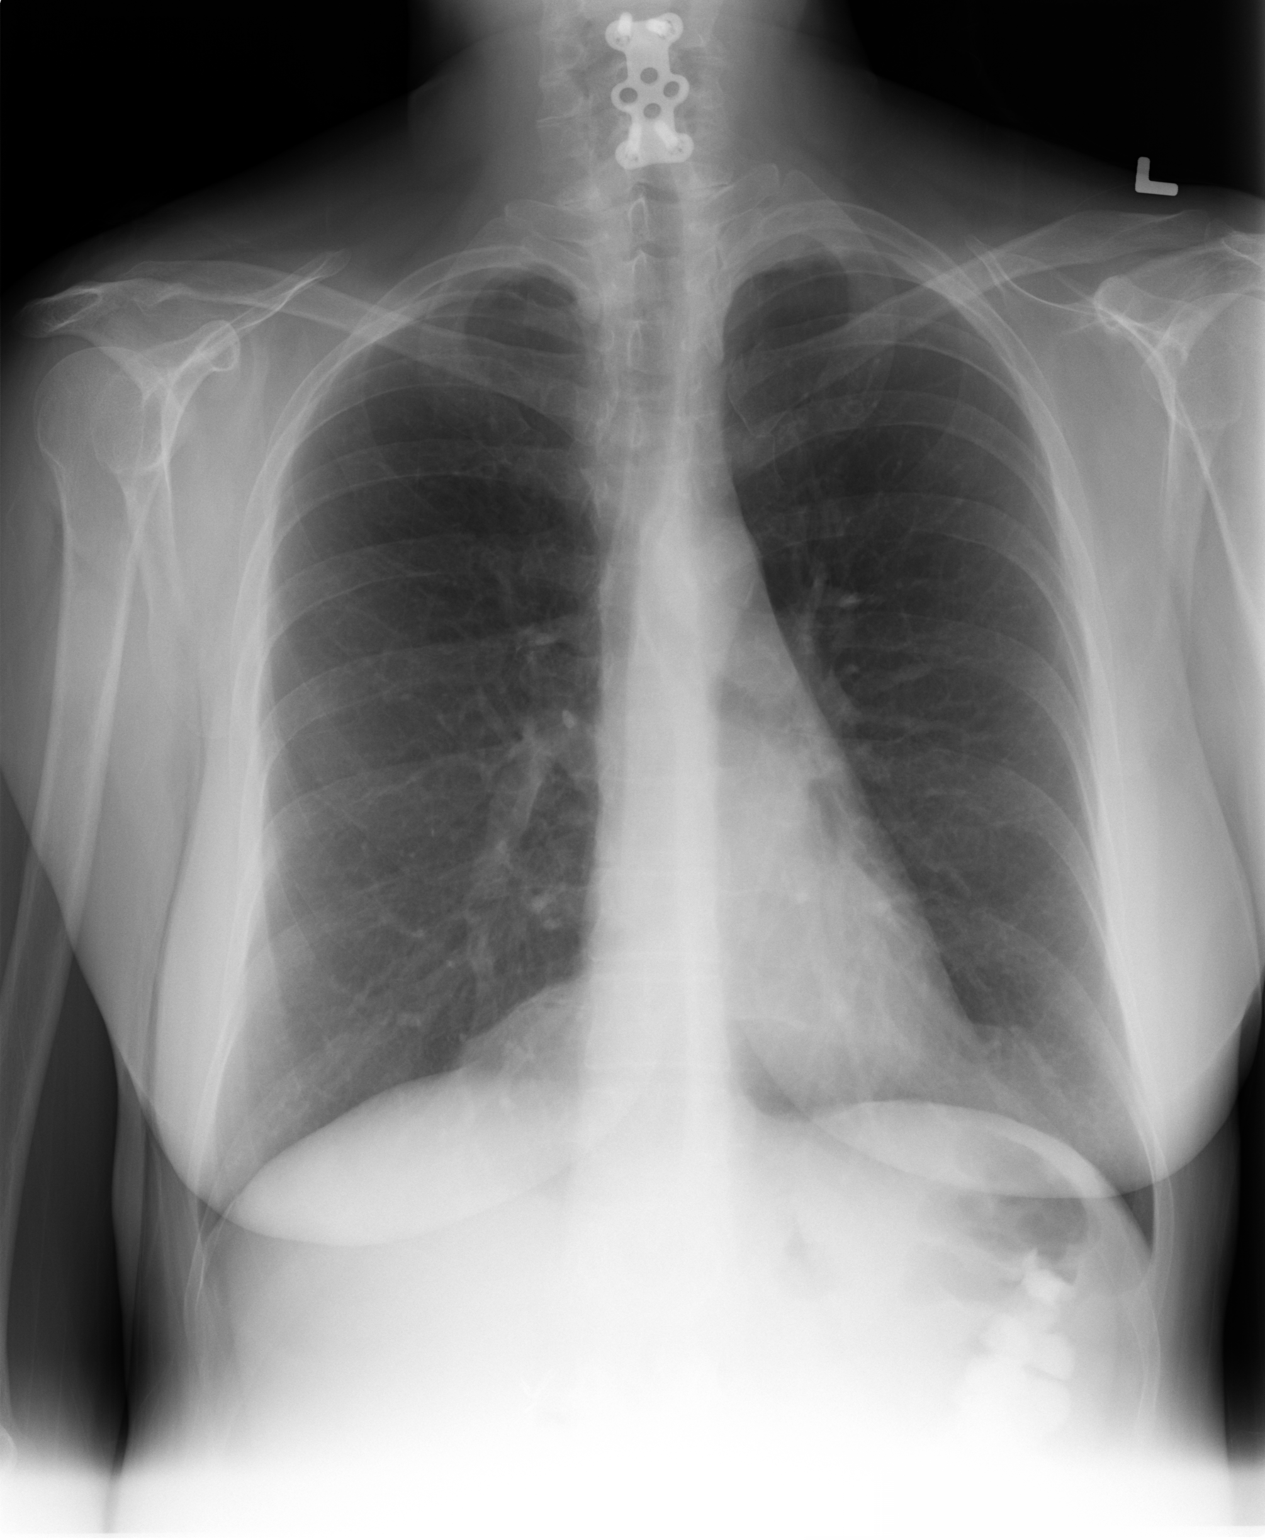
[im 2/2]
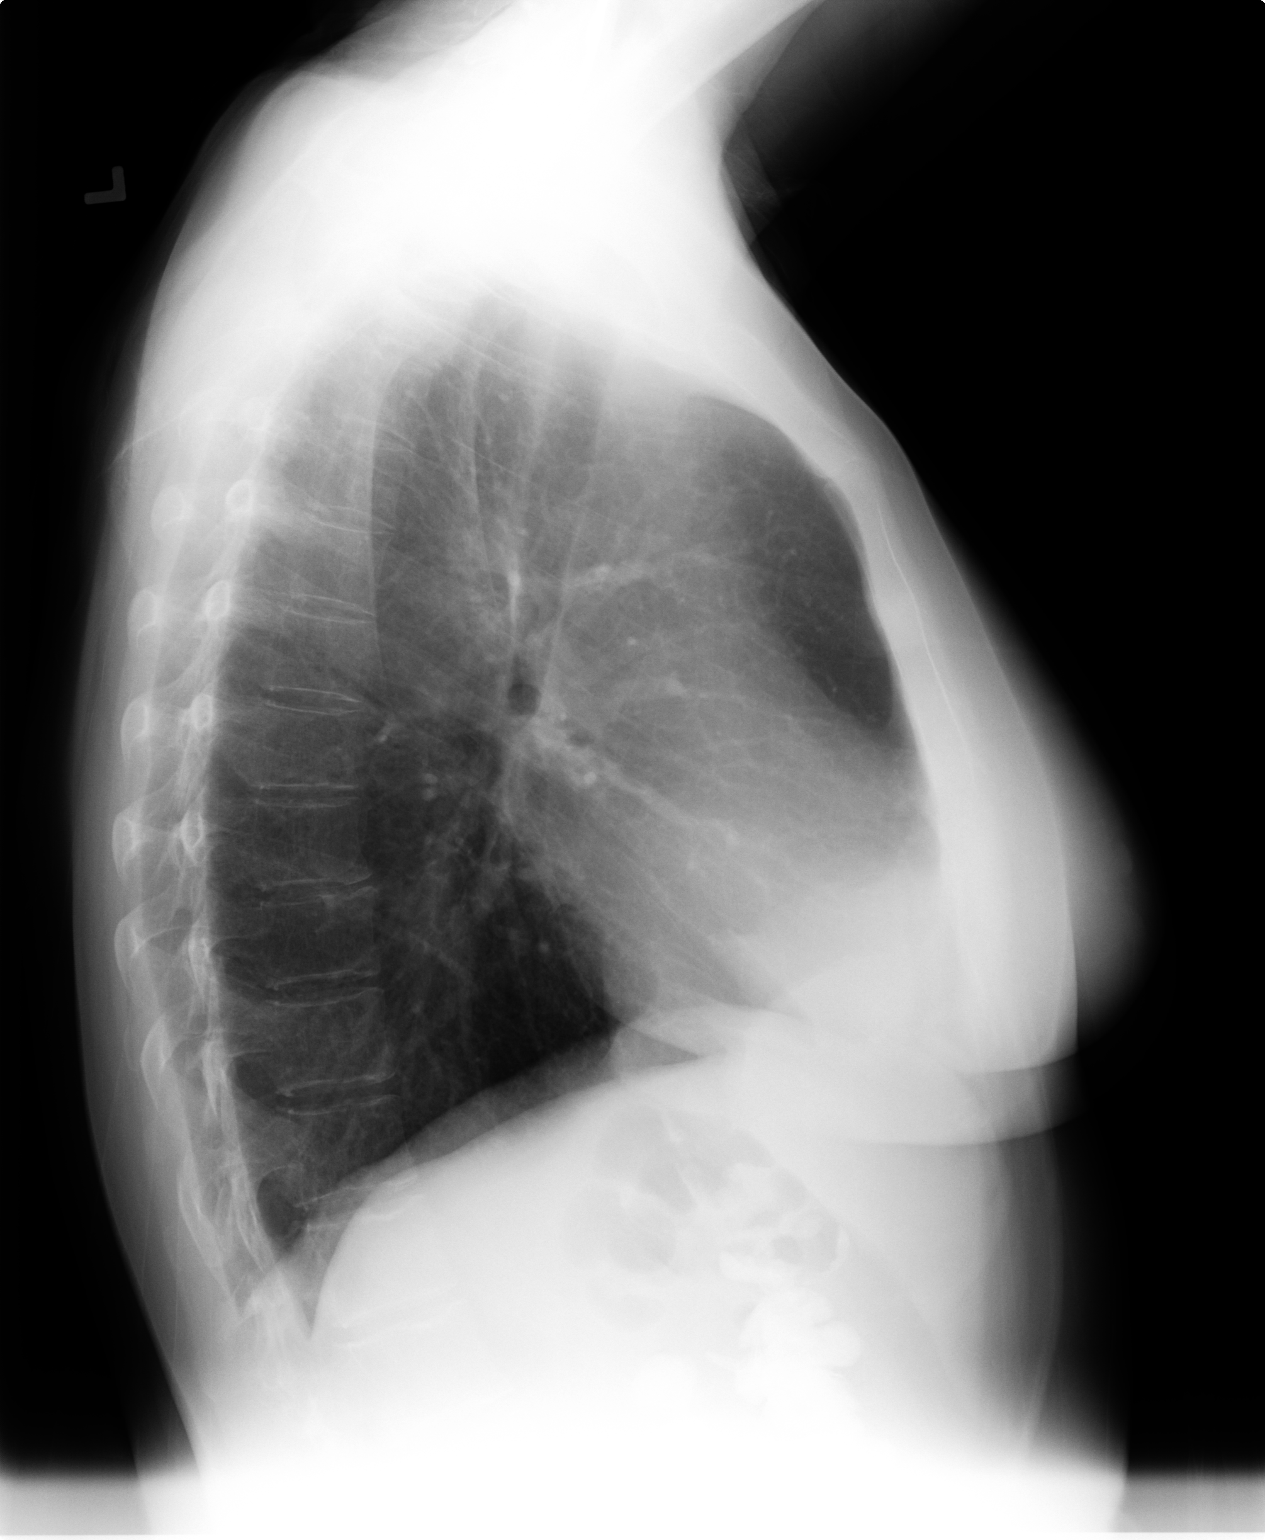

[2 of 2 positions shown; findings below may reference images not displayed]

Canned report from images found in remote index.

Refer to host system for actual result text.

## 2009-01-18 IMAGING — CR DG ABDOMEN ACUTE W/ 1V CHEST
4 series · 4 of 4 positions shown · non-contrast
Comparison: 12/13/2004

CLINICAL DATA: Grass abdominal pain. Nausea and vomiting.

[w chest pa]
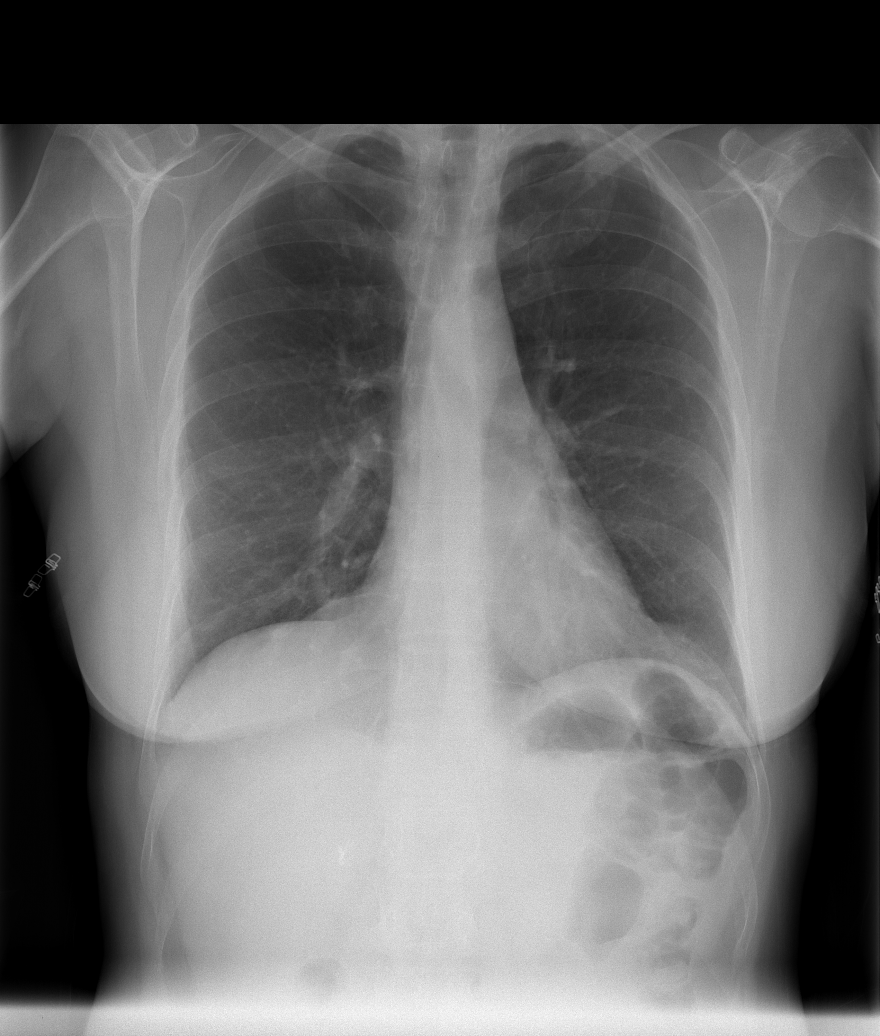

[w abdomen upright *]
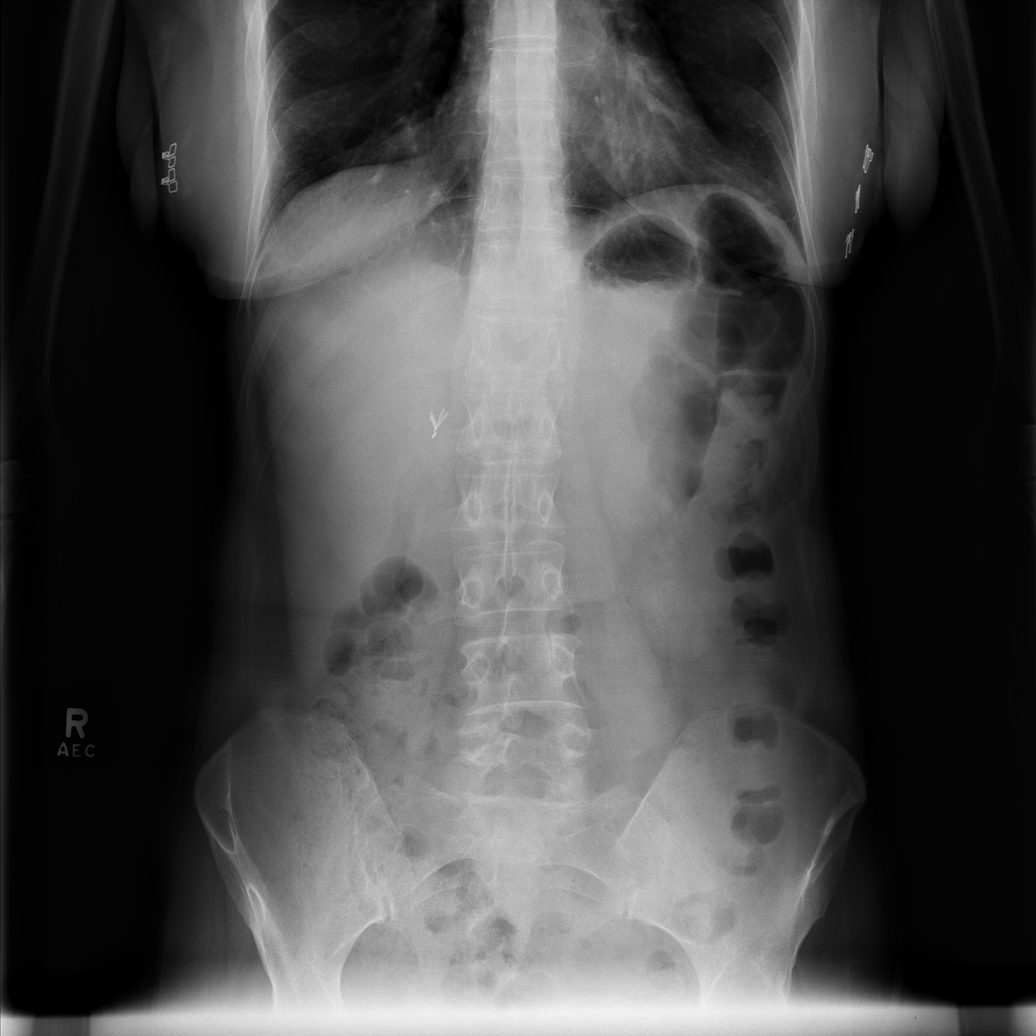

[t abdomen supine (1 of 2)]
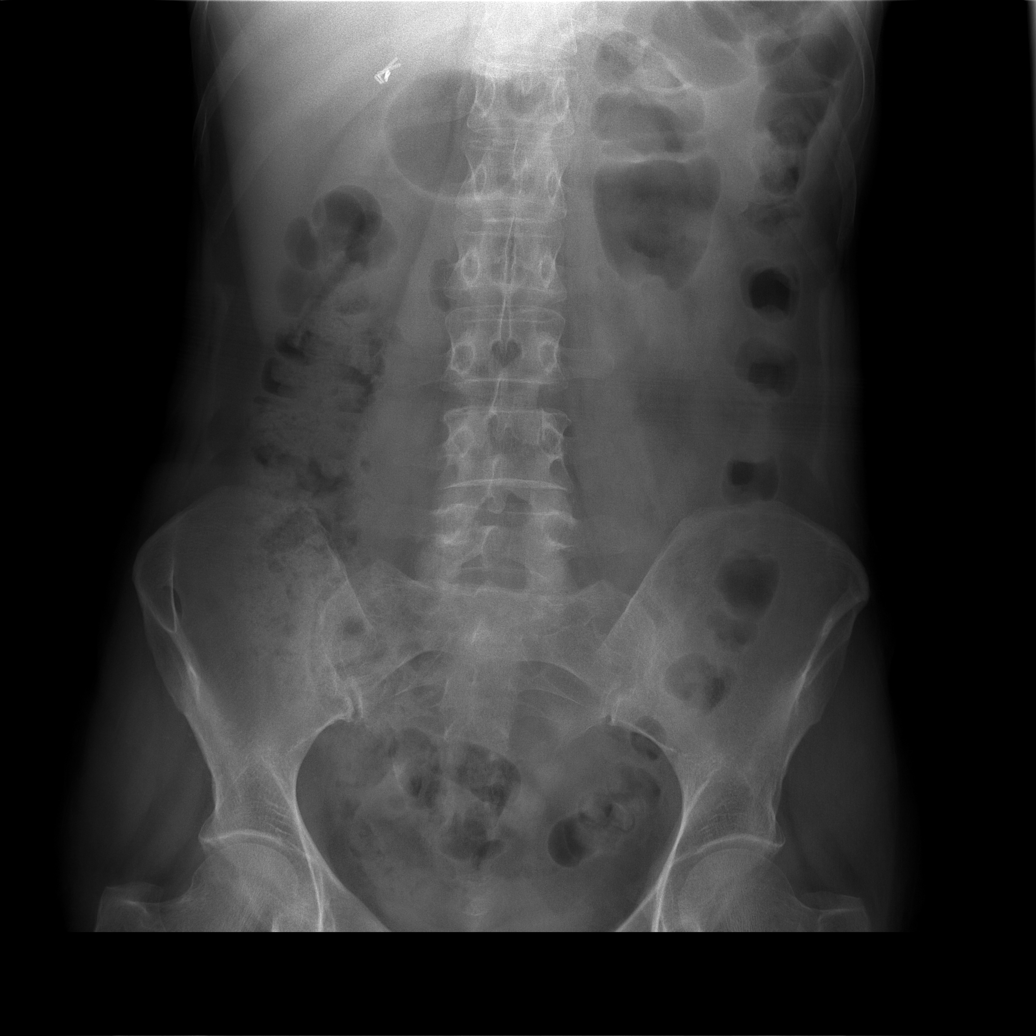

[t abdomen supine (2 of 2)]
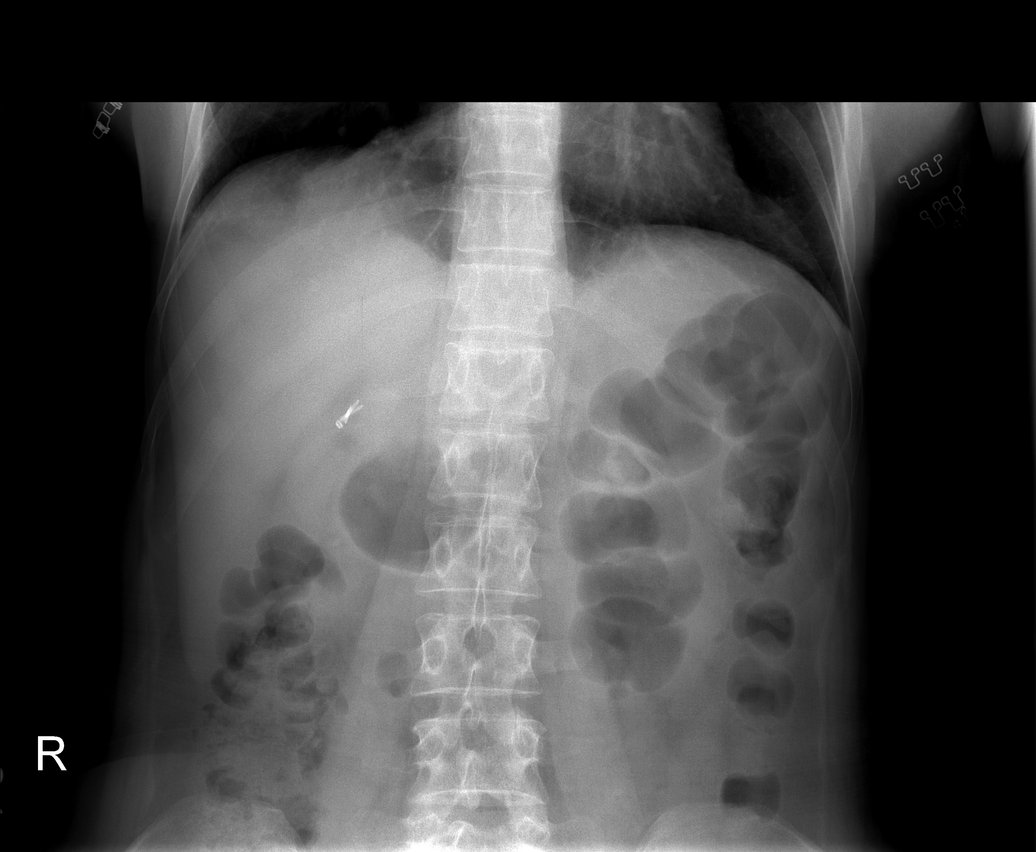

[4 of 4 positions shown; findings below may reference images not displayed]

ABDOMEN SERIES - 2 VIEW & CHEST - 1 VIEW:

Lungs are hyperinflated but clear. Stable biapical scarring. Heart size is
within normal limits.

Supine and upright views of the abdomen are without evidence for intraperitoneal
free air.  Bowel gas pattern is nonspecific.  Imaged bony structures are
unremarkable.
IMPRESSION: Emphysema without acute cardiopulmonary findings.

Nonspecific bowel gas pattern.

## 2009-02-05 ENCOUNTER — Emergency Department (HOSPITAL_COMMUNITY): Admission: EM | Admit: 2009-02-05 | Discharge: 2009-02-05 | Payer: Self-pay | Admitting: Emergency Medicine

## 2009-02-10 ENCOUNTER — Ambulatory Visit (HOSPITAL_BASED_OUTPATIENT_CLINIC_OR_DEPARTMENT_OTHER): Admission: RE | Admit: 2009-02-10 | Discharge: 2009-02-10 | Payer: Self-pay | Admitting: *Deleted

## 2009-02-22 IMAGING — CT CT PELVIS W/ CM
1 of 2 series · 15 of 32 positions shown, 20 images · IV contrast (omnipaque)
Comparison: none

CLINICAL DATA: Right lower quadrant pain.  Clinical question of inguinal hernia.   
 PELVIS CT WITH CONTRAST:
TECHNIQUE: Multidetector CT imaging of the pelvis was performed following the standard protocol during bolus administration of intravenous contrast.
 Contrast:  125 cc of Omnipaque 300

[Series 2: pel 5.0 b40f st · axial · 0.62mm/px · z∈[-412,-178]mm · 15 of 53 slices shown, 20 images]
[im 3/53  soft-tissue]
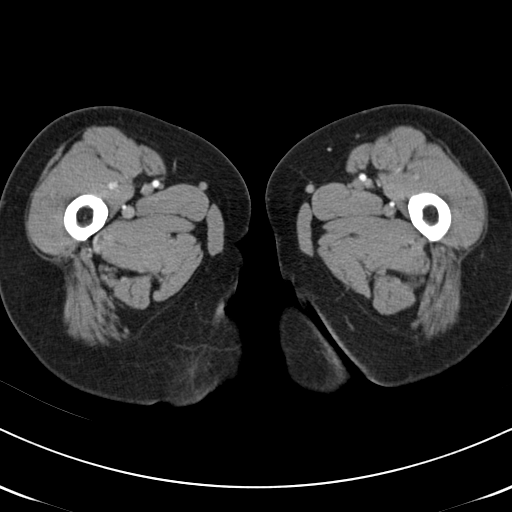
[im 3/53  bone]
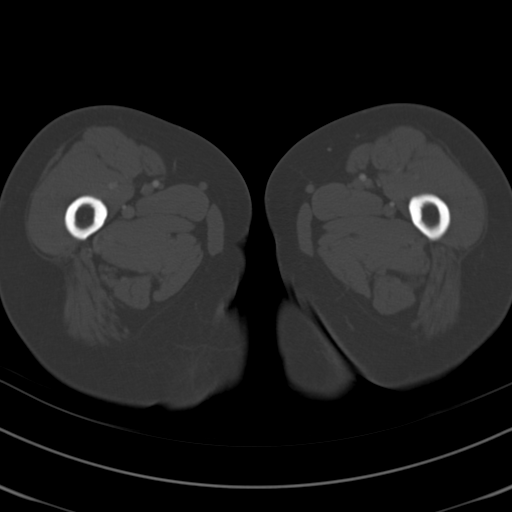
[im 7/53  soft-tissue]
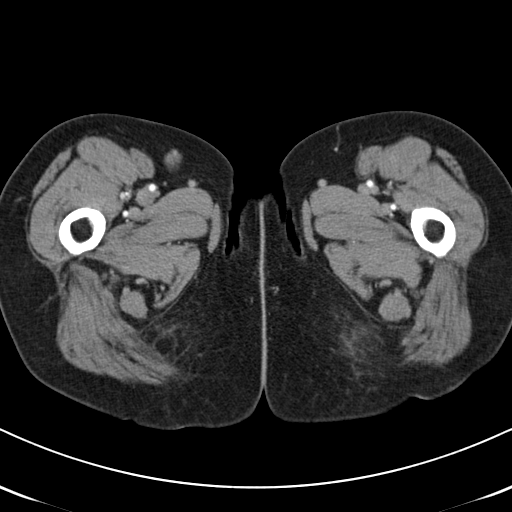
[im 10/53  soft-tissue]
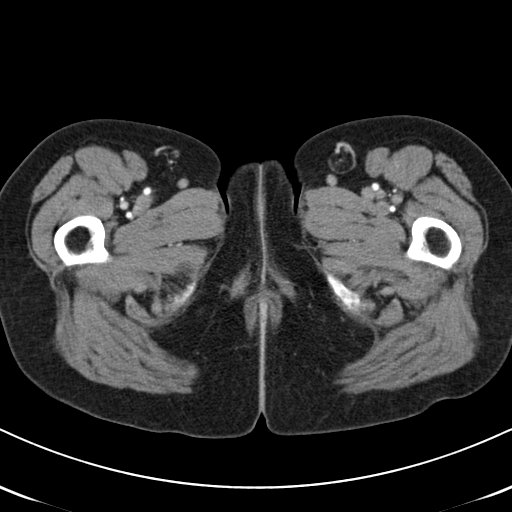
[im 14/53  soft-tissue]
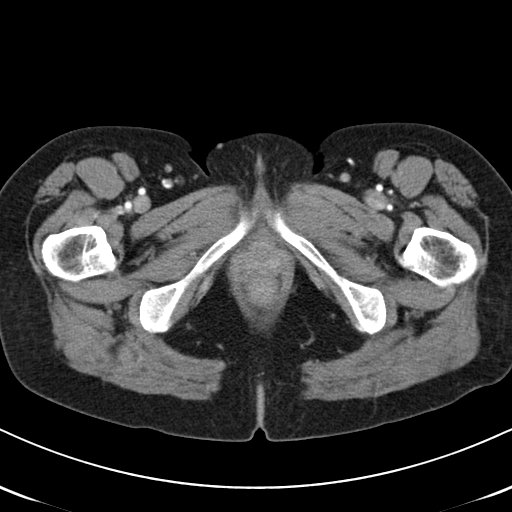
[im 19/53  soft-tissue]
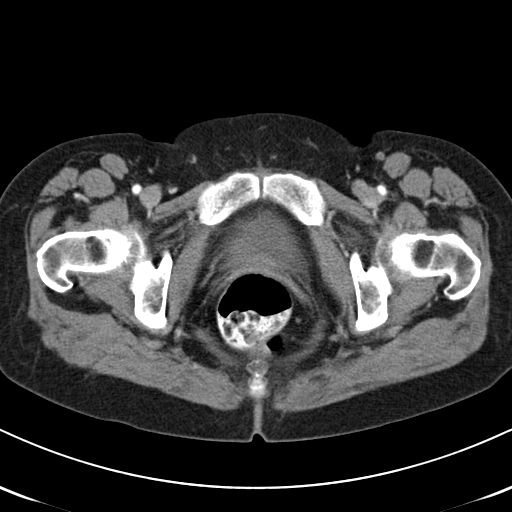
[im 21/53  soft-tissue]
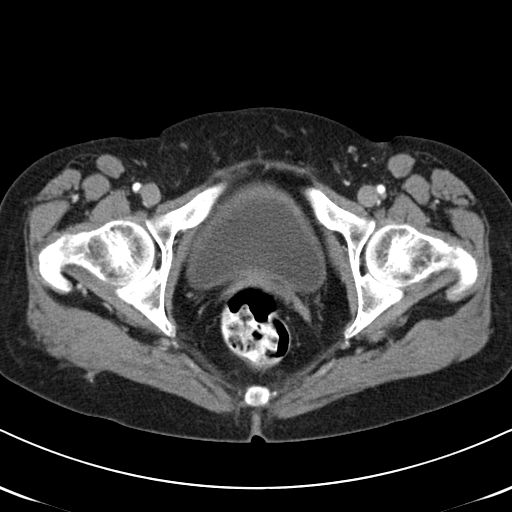
[im 25/53  soft-tissue]
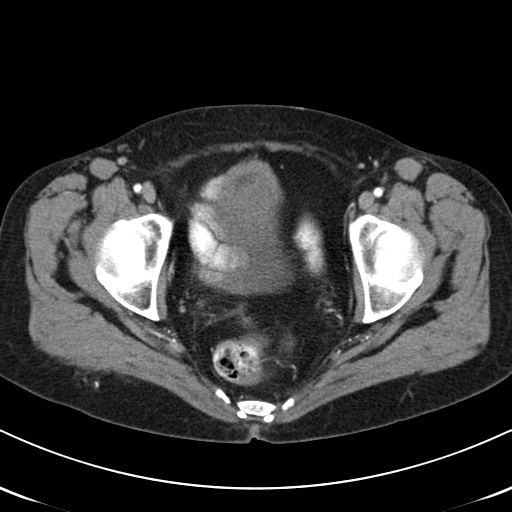
[im 28/53  soft-tissue]
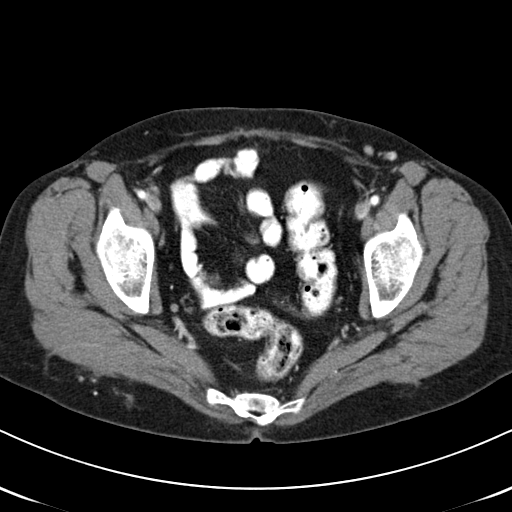
[im 32/53  soft-tissue]
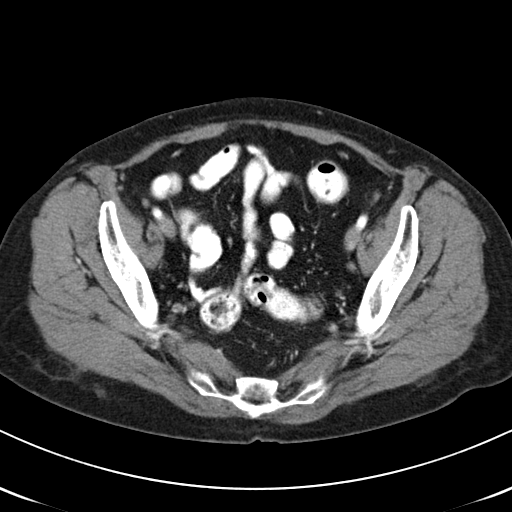
[im 32/53  bone]
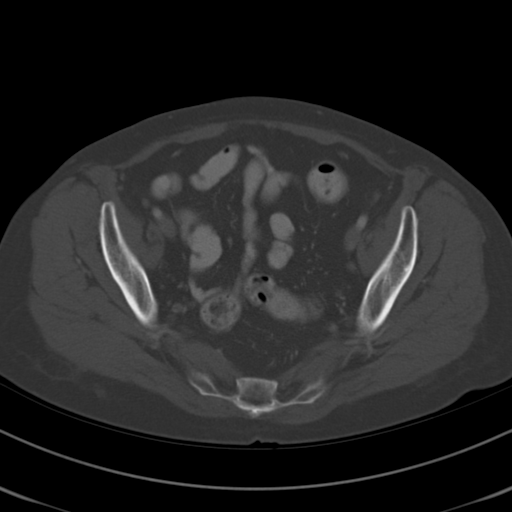
[im 34/53  soft-tissue]
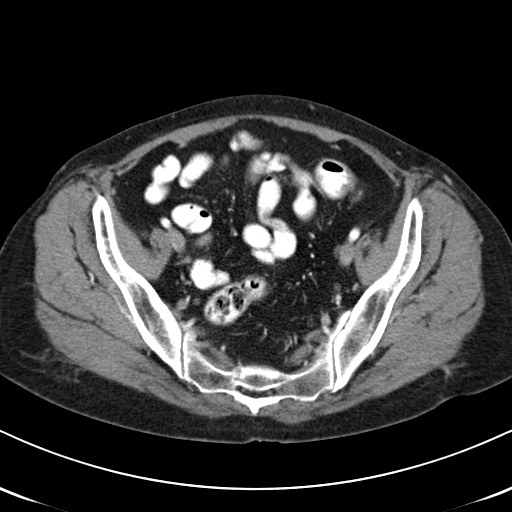
[im 39/53  soft-tissue]
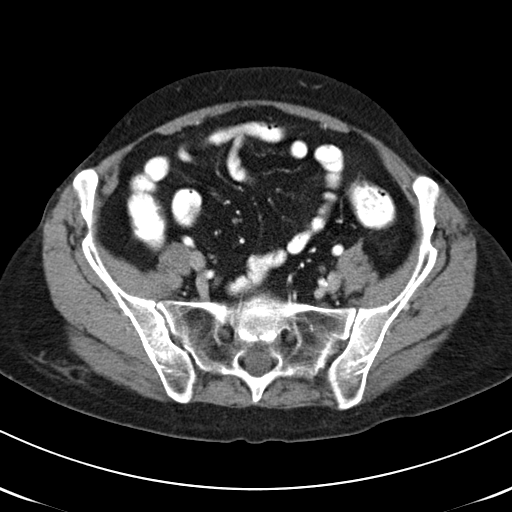
[im 43/53  soft-tissue]
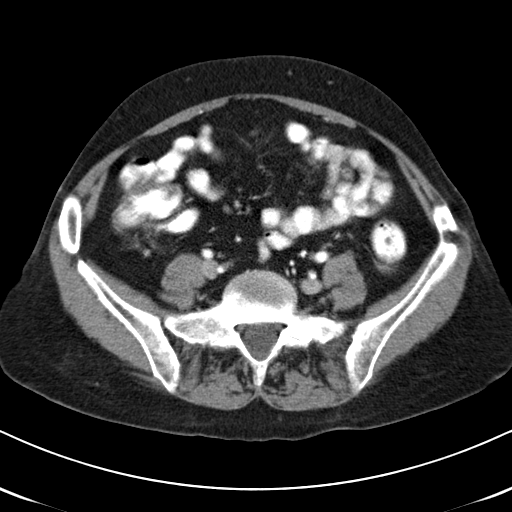
[im 43/53  lung]
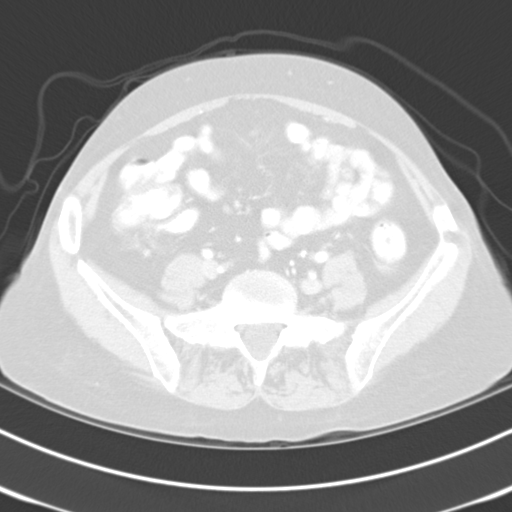
[im 46/53  soft-tissue]
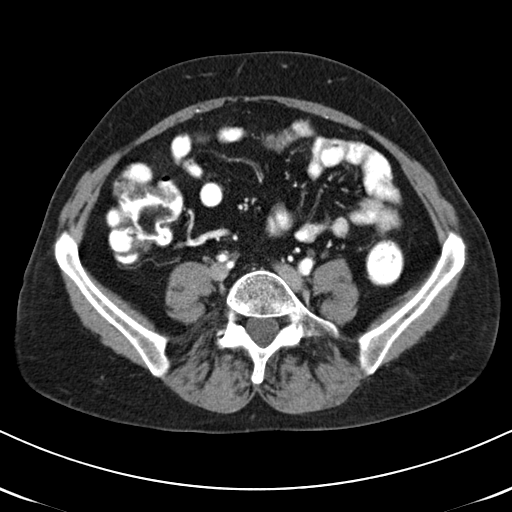
[im 46/53  lung]
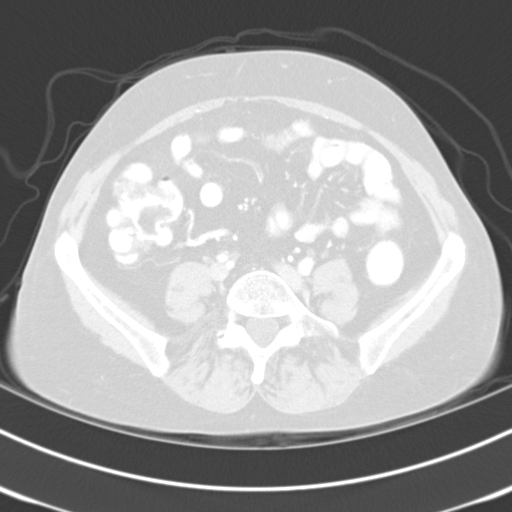
[im 48/53  lung]
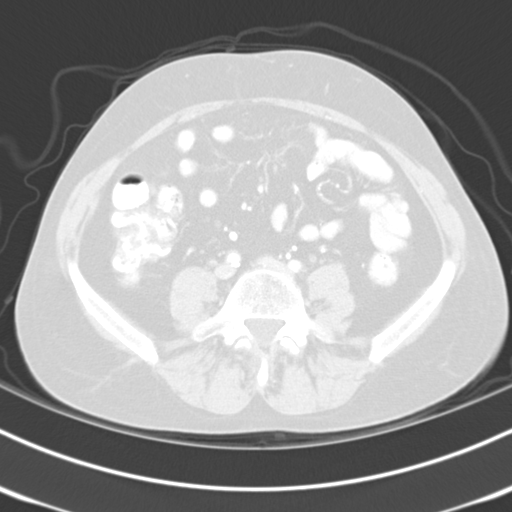
[im 50/53  soft-tissue]
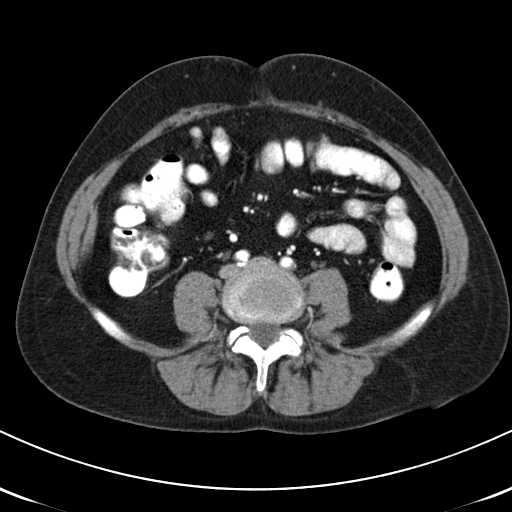
[im 50/53  lung]
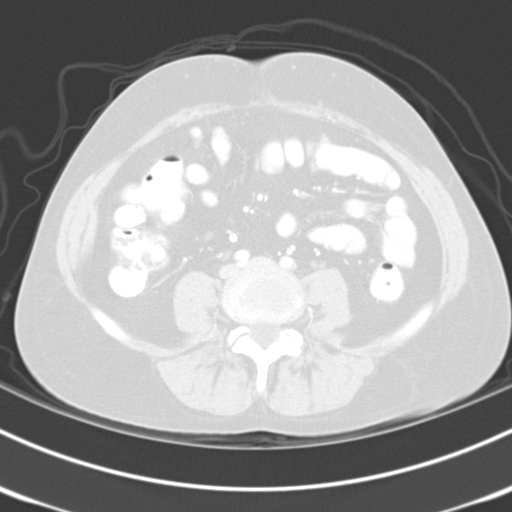

[15 of 32 positions shown; findings below may reference images not displayed]

FINDINGS: Hysterectomy.  Specifically, no evidence of an inguinal or femoral hernia.  Suggestion of diffuse bladder wall thickening as also was noted on 11/05/2006 CT.  This may be due to chronic cystitis.  No unusual bowel wall thickening.  Normal density of the intrapelvic fat.  No mass.
IMPRESSION: Specifically, no evidence of inguinal hernia.

## 2009-03-01 IMAGING — US US ABDOMEN COMPLETE
1 series · 13 of 25 positions shown · non-contrast
Comparison: NONE

CLINICAL DATA: Recurrent hematuria, abdominal pain and bloating 

ABDOMINAL ULTRASOUND

[Series 1: us abd · 0.27mm/px · 13 of 85 slices shown]
[im 1/85]
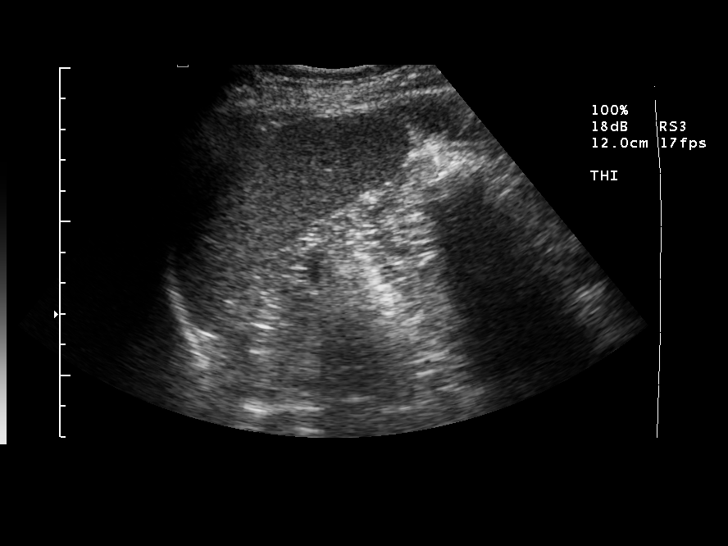
[im 8/85]
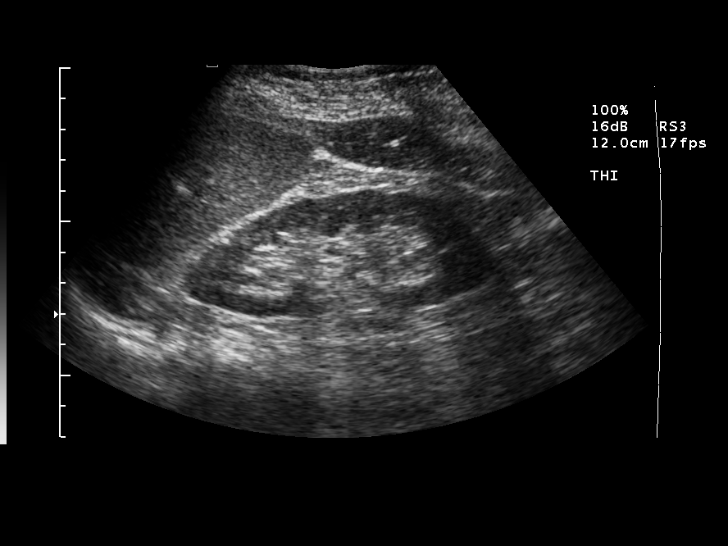
[im 15/85]
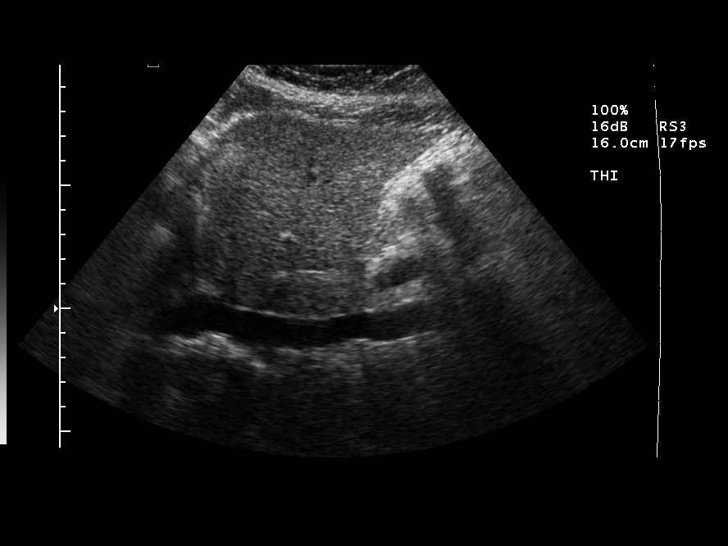
[im 22/85]
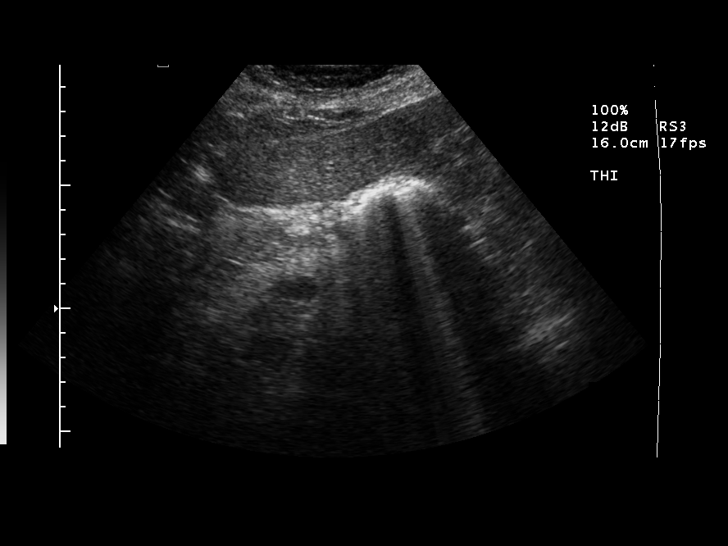
[im 29/85]
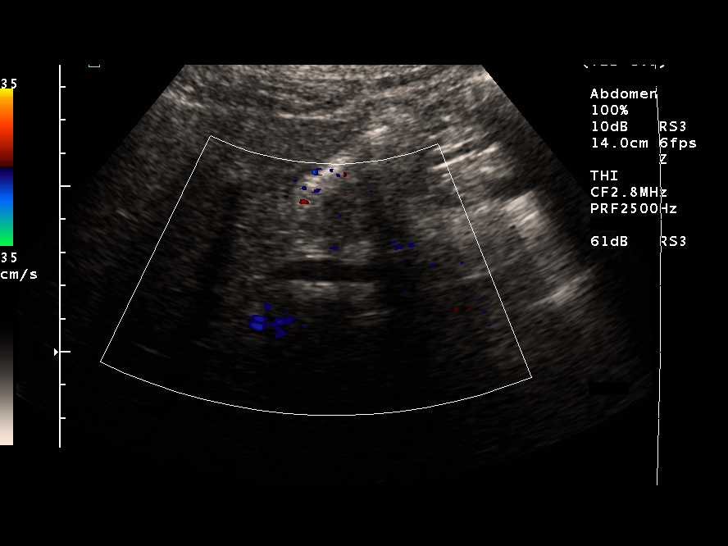
[im 36/85]
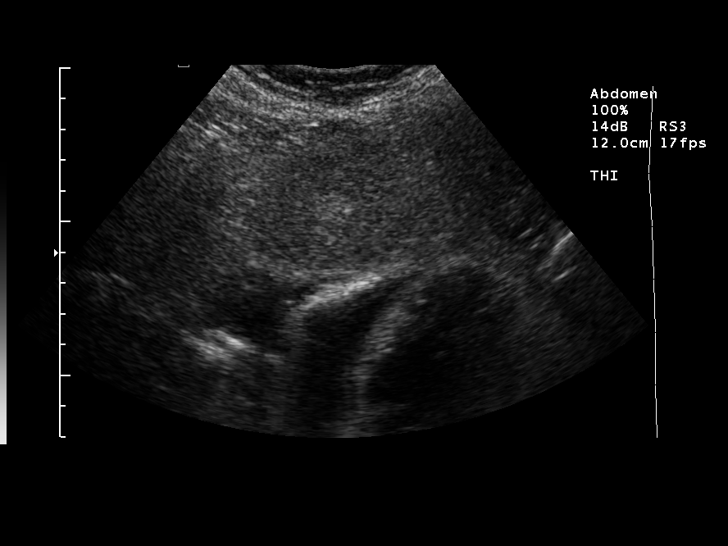
[im 43/85]
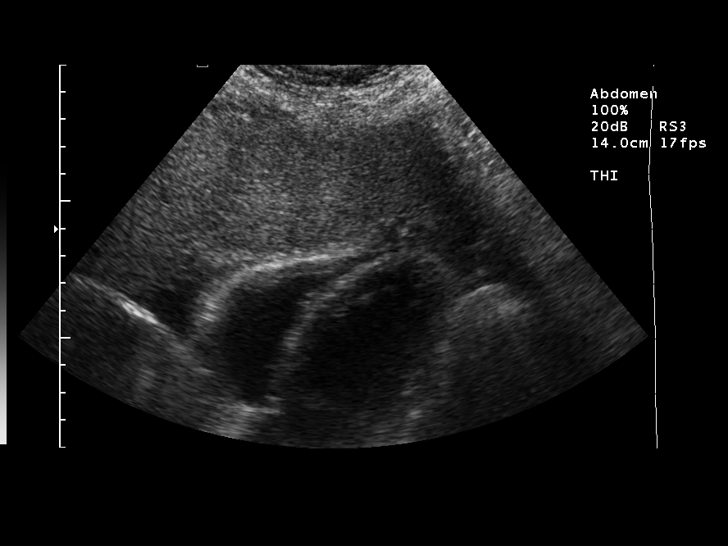
[im 50/85]
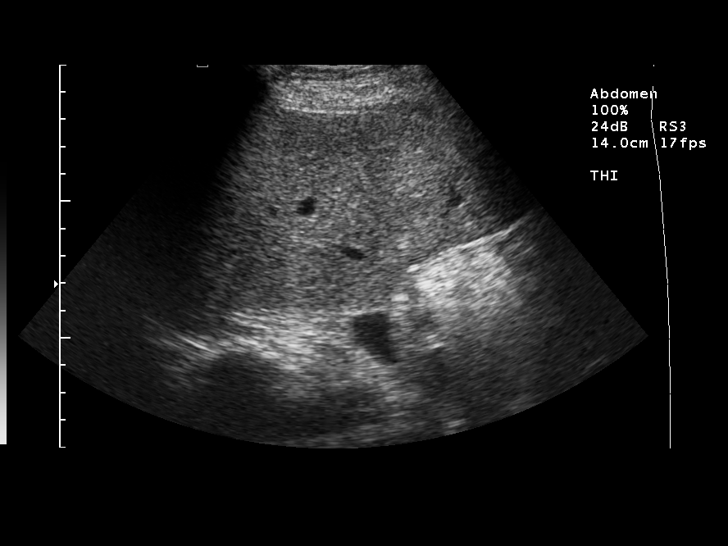
[im 57/85]
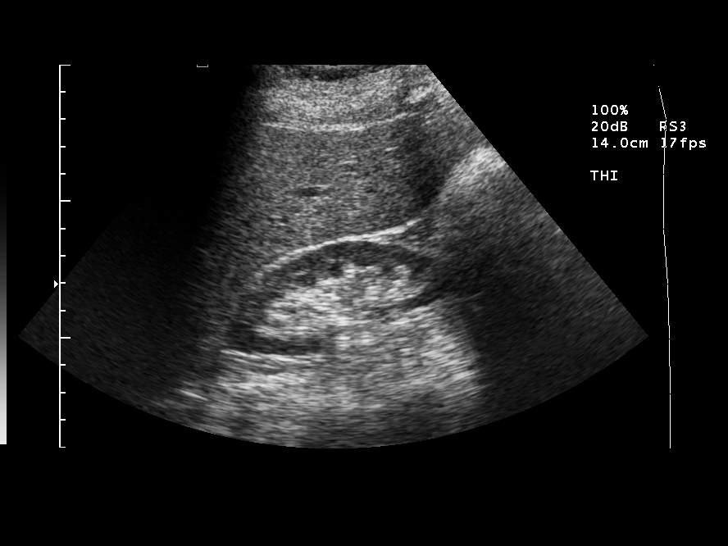
[im 64/85]
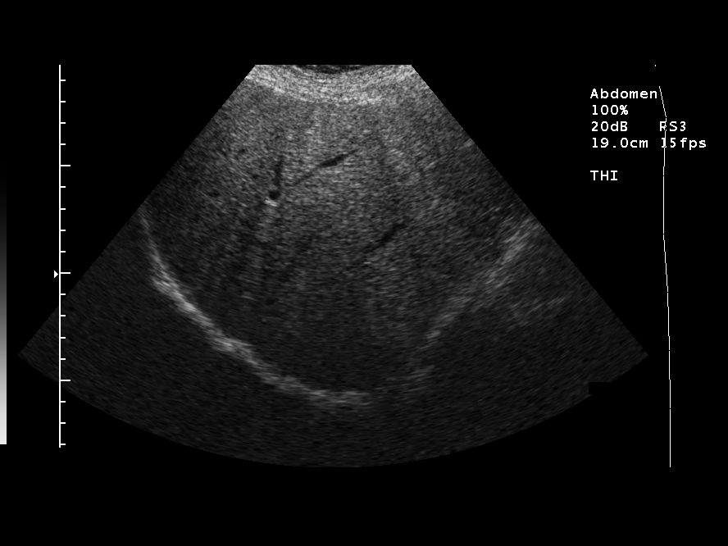
[im 71/85]
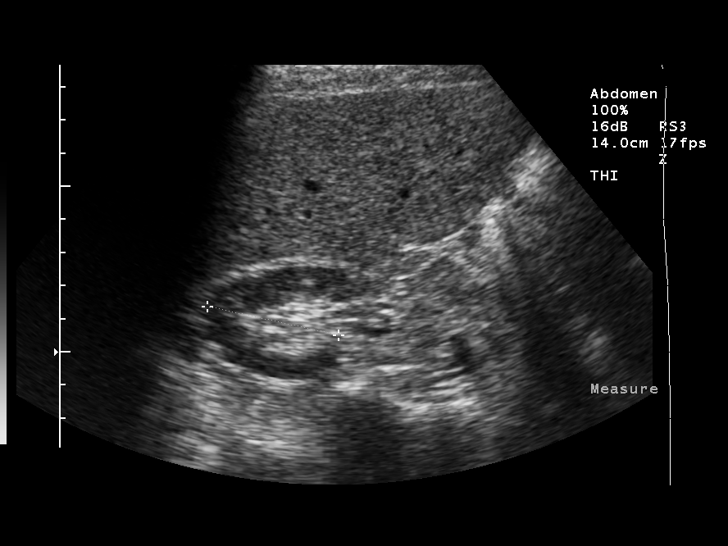
[im 78/85]
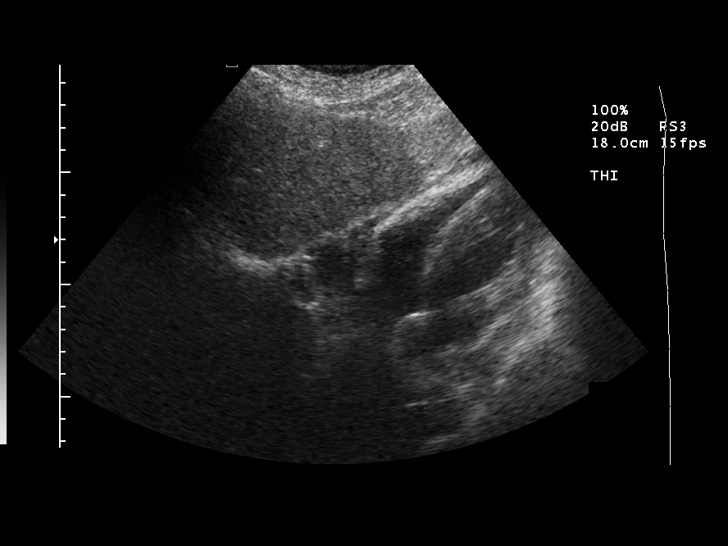
[im 85/85]
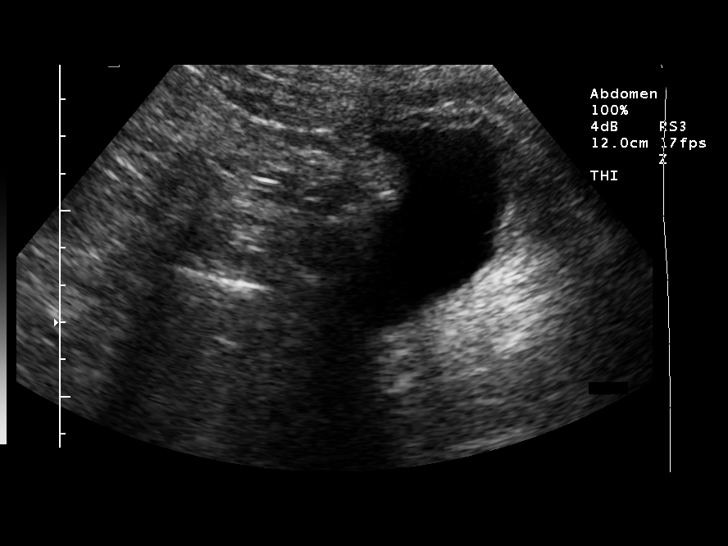

[13 of 25 positions shown; findings below may reference images not displayed]

FINDINGS: There are no prior ultrasound studies available for 
comparison. The patient is status-post cholecystectomy. Bile 
ducts: The common duct measures 9 mm and is upper limits of normal 
for a patient who has had cholecystectomy. Liver: Homogeneous 
echogenicity and focal hyperechoic areas seen near the dome of the 
liver. This measures 13 x 12 x 11 mm. No evidence of intrahepatic 
biliary dilatation. The liver measures 15.6 cm in midclavicular 
length and is upper limits of normal. Spleen: Homogeneous 
echogenicity and normal size. The spleen measures 8.9 cm in 
length. Kidneys: Normal in size, shape and echogenicity. No 
hydronephrosis or nephrolithiasis. No cortical masses are seen. 
The left kidney measures 9.9 cm in length and the right kidney 
measures approximately 9.4 cm in length. Pancreas: Pancreas is not 
well-seen. Visualized portion of the body and head of the pancreas 
is normal in size and echogenicity. The common bile duct in the 
head of the pancreas measures approximately 8 mm in diameter. 
Aorta: Normal caliber. No ascites. There is suggestion of right 
pleural effusion. No gross intraluminal masses are seen in the 
bladder.
IMPRESSION: No evidence of renal masses or bladder masses. 
Status-post cholecystectomy. Hyperechoic area in the liver, which 
likely represents a cavernous hemangioma. Small right pleural 
effusion suggested. ([id number] DICTATING PHYSICIAN) Temezgen Wijbenga, 
01/06/2007  Trans Date: «APPT_TD

## 2009-03-10 IMAGING — CR DG CHEST 2V
2 series · 2 of 2 positions shown · non-contrast
Comparison: none

CLINICAL DATA: Shortness of breath

CHEST - 2 VIEW:

[w chest pa]
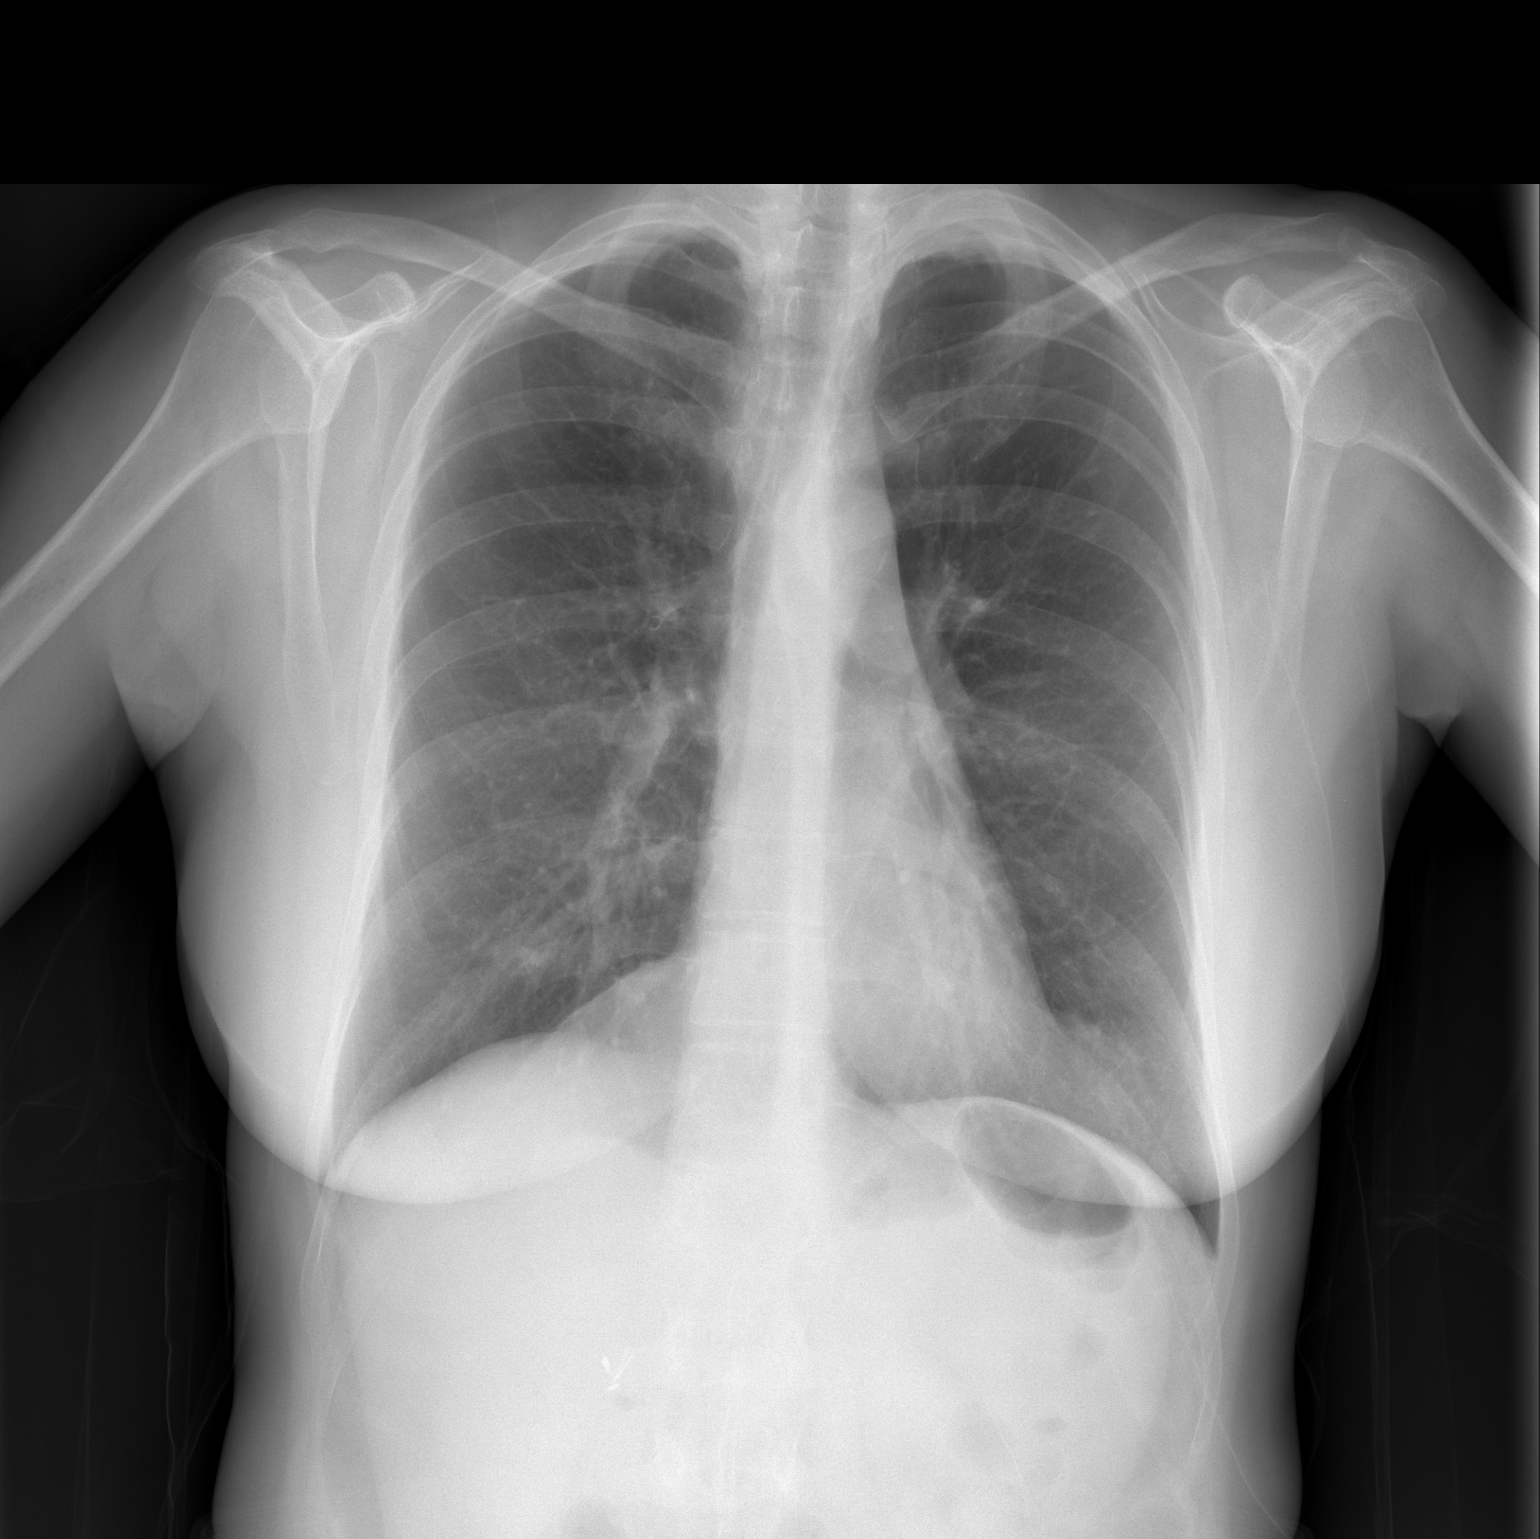

[w chest lat]
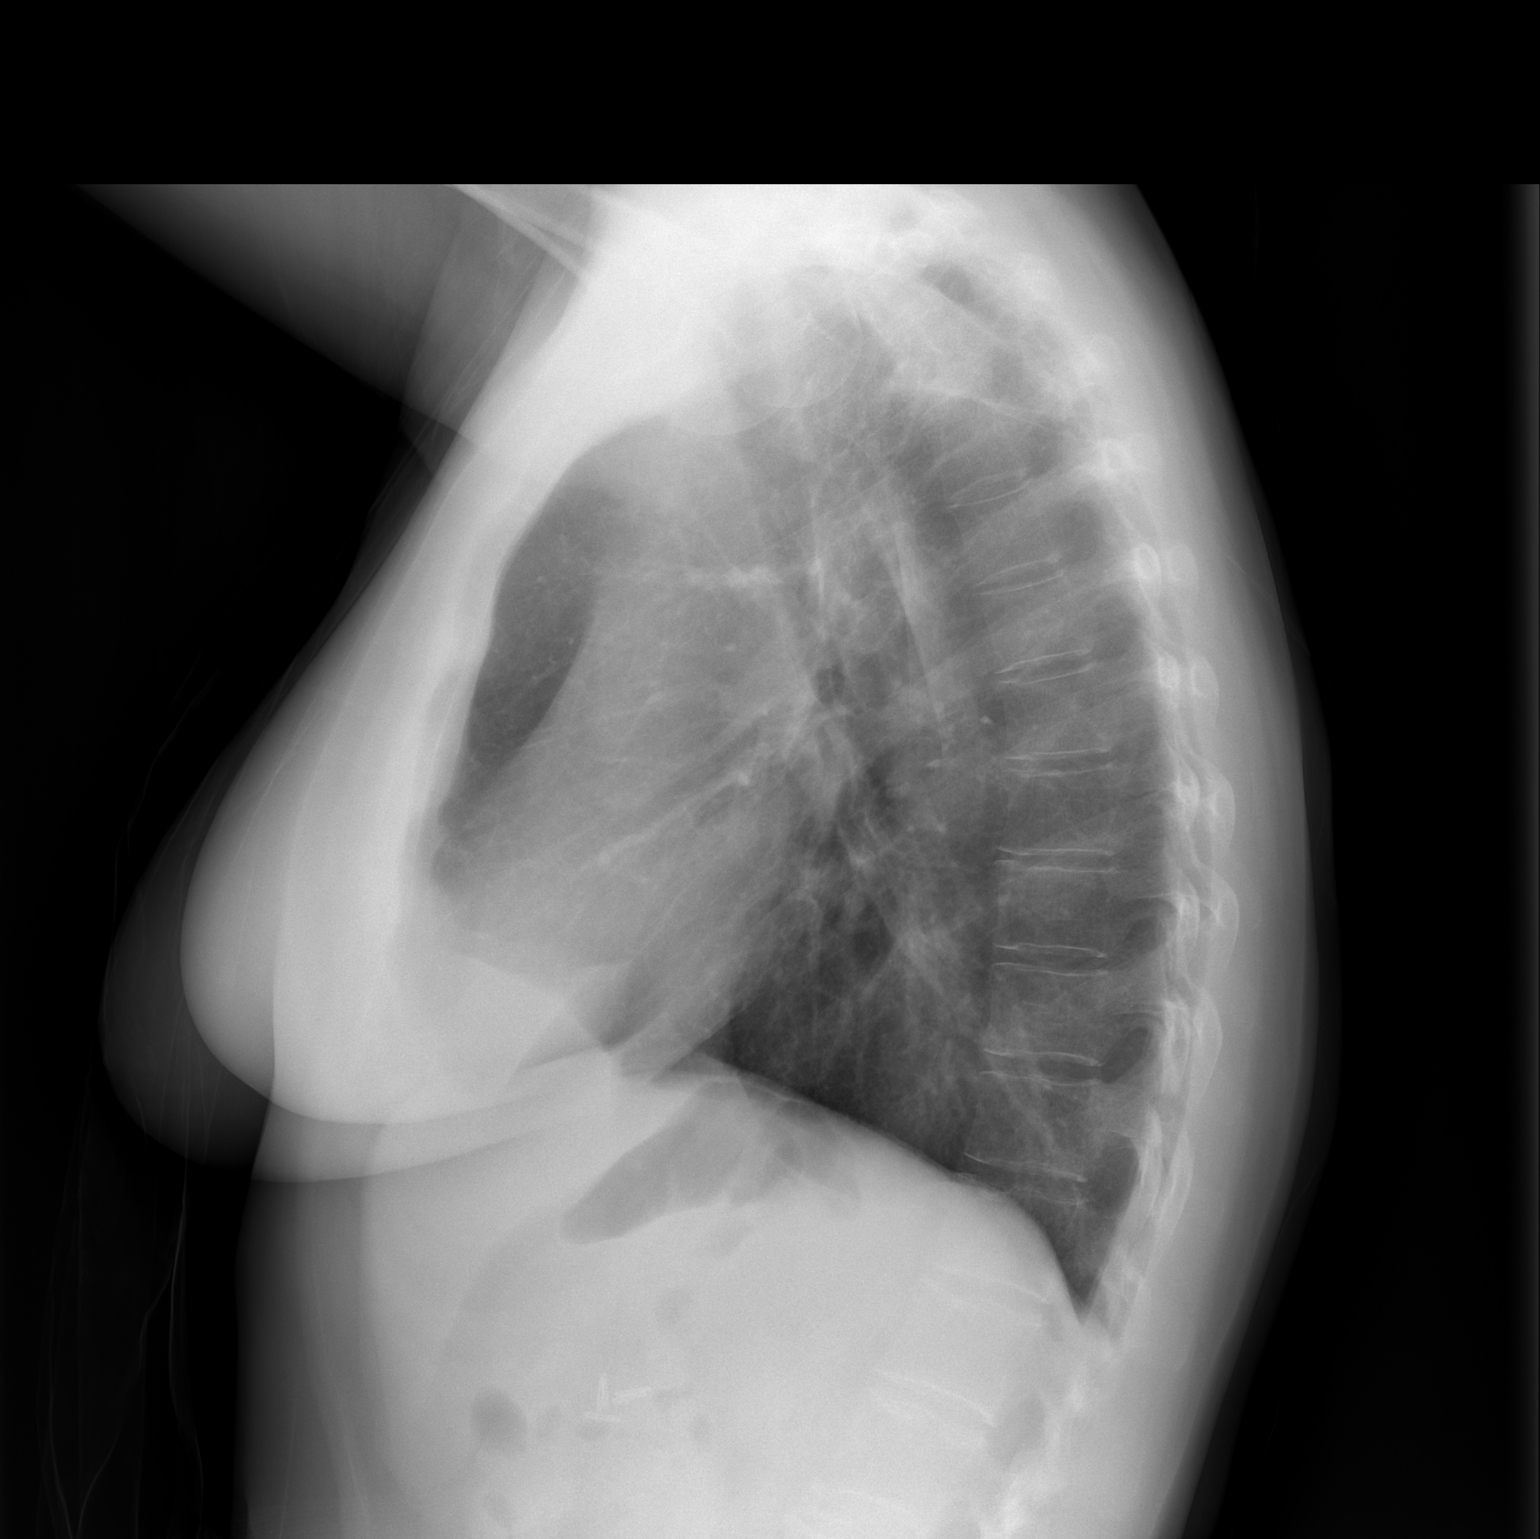

[2 of 2 positions shown; findings below may reference images not displayed]

FINDINGS: The heart size and mediastinal contours are within normal limits. 
Both lungs are clear.  The visualized skeletal structures are unremarkable. COPD
changes are noted.
IMPRESSION: No active cardiopulmonary disease.

COPD.

## 2009-03-27 ENCOUNTER — Inpatient Hospital Stay (HOSPITAL_COMMUNITY): Admission: EM | Admit: 2009-03-27 | Discharge: 2009-03-29 | Payer: Self-pay | Admitting: Emergency Medicine

## 2009-03-31 ENCOUNTER — Emergency Department (HOSPITAL_COMMUNITY): Admission: EM | Admit: 2009-03-31 | Discharge: 2009-03-31 | Payer: Self-pay | Admitting: Emergency Medicine

## 2009-03-31 ENCOUNTER — Ambulatory Visit: Payer: Self-pay | Admitting: Cardiology

## 2009-04-27 ENCOUNTER — Telehealth: Payer: Self-pay | Admitting: Gastroenterology

## 2009-05-02 DIAGNOSIS — R079 Chest pain, unspecified: Secondary | ICD-10-CM

## 2009-05-02 HISTORY — DX: Chest pain, unspecified: R07.9

## 2009-06-14 ENCOUNTER — Ambulatory Visit (HOSPITAL_COMMUNITY): Admission: RE | Admit: 2009-06-14 | Discharge: 2009-06-14 | Payer: Self-pay | Admitting: Orthopaedic Surgery

## 2009-07-23 ENCOUNTER — Inpatient Hospital Stay (HOSPITAL_COMMUNITY): Admission: EM | Admit: 2009-07-23 | Discharge: 2009-07-23 | Payer: Self-pay | Admitting: Emergency Medicine

## 2009-07-23 ENCOUNTER — Ambulatory Visit: Payer: Self-pay | Admitting: Internal Medicine

## 2009-10-08 ENCOUNTER — Emergency Department (HOSPITAL_COMMUNITY): Admission: EM | Admit: 2009-10-08 | Discharge: 2009-10-08 | Payer: Self-pay | Admitting: Emergency Medicine

## 2009-10-23 ENCOUNTER — Telehealth: Payer: Self-pay | Admitting: Gastroenterology

## 2009-10-26 ENCOUNTER — Ambulatory Visit: Payer: Self-pay | Admitting: Gastroenterology

## 2009-10-26 DIAGNOSIS — K3184 Gastroparesis: Secondary | ICD-10-CM | POA: Insufficient documentation

## 2010-01-06 ENCOUNTER — Emergency Department (HOSPITAL_COMMUNITY): Admission: EM | Admit: 2010-01-06 | Discharge: 2010-01-07 | Payer: Self-pay | Admitting: Emergency Medicine

## 2010-02-02 IMAGING — CT CT ABDOMEN W/ CM
2 of 5 series · 17 of 46 positions shown, 19 images · IV contrast (omnipaque)
Comparison: CT abdomen pelvis 11/05/2006

CLINICAL DATA: abd pain.

CT ABDOMEN AND PELVIS WITH CONTRAST
TECHNIQUE: Multidetector CT imaging of the abdomen and pelvis was
performed using the standard protocol following bolus
administration of intravenous contrast.
Contrast: 125 ml Omnipaque

[Series 2: abd_pel 5.0 b40s · axial · 0.68mm/px · z∈[-456,-56]mm · 14 of 90 slices shown, 16 images]
[im 5/90  soft-tissue]
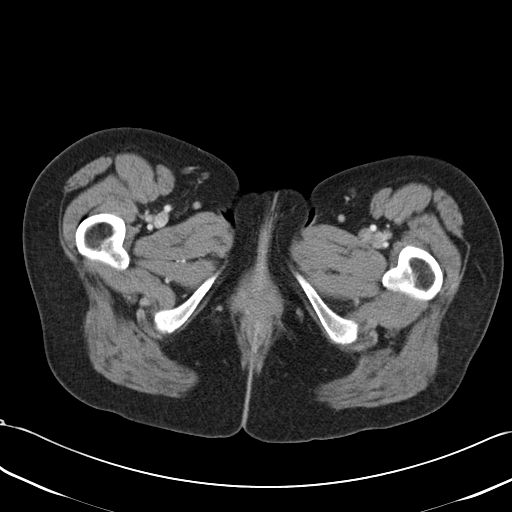
[im 5/90  bone]
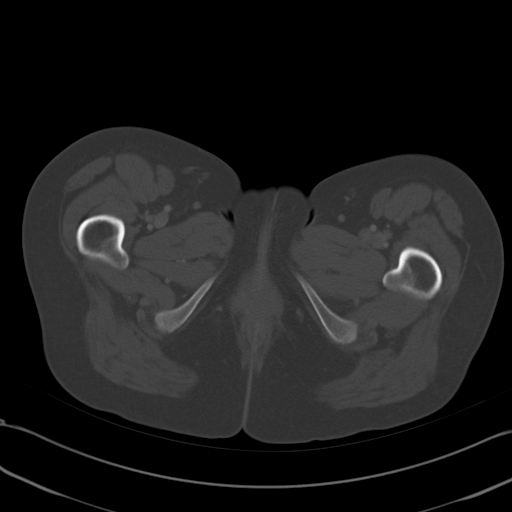
[im 14/90  soft-tissue]
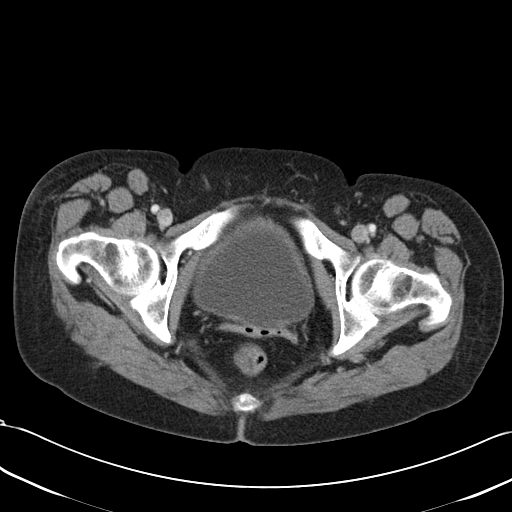
[im 18/90  soft-tissue]
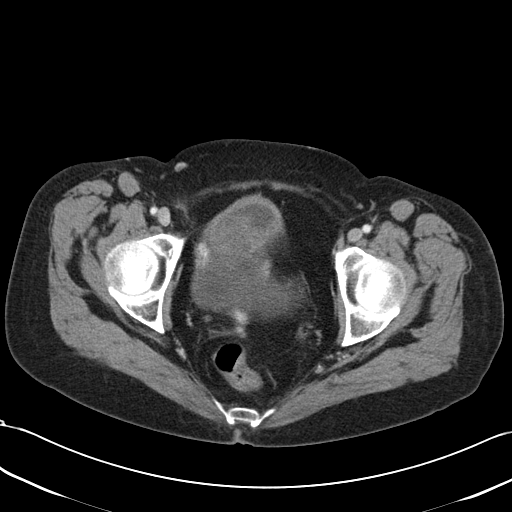
[im 23/90  soft-tissue]
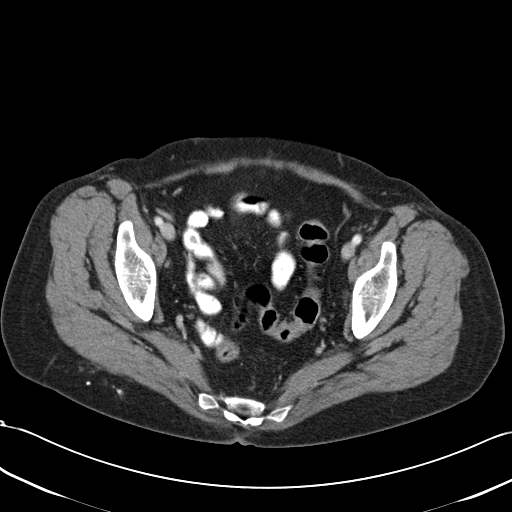
[im 32/90  soft-tissue]
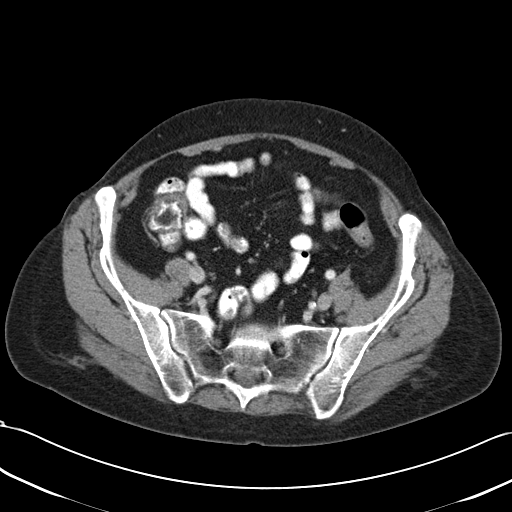
[im 36/90  soft-tissue]
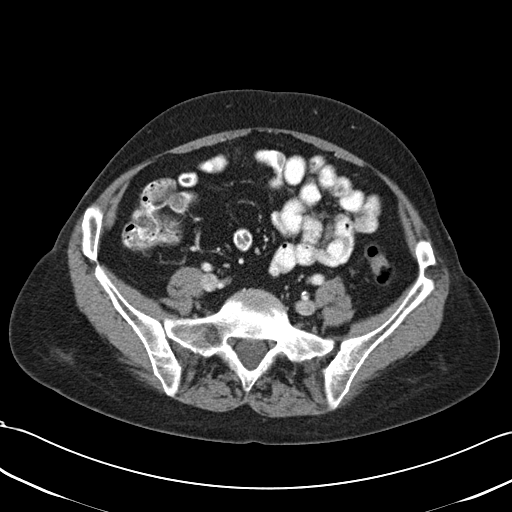
[im 41/90  soft-tissue]
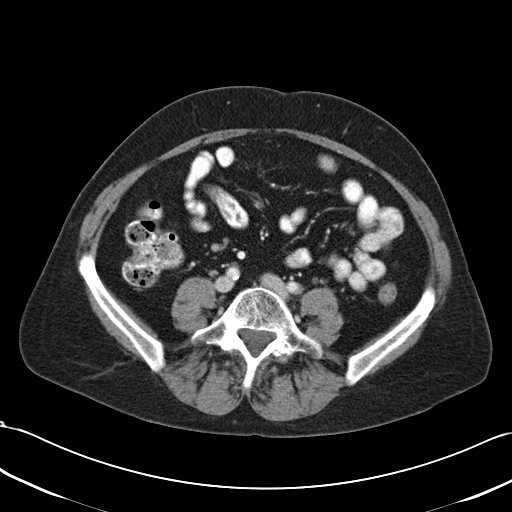
[im 49/90  soft-tissue]
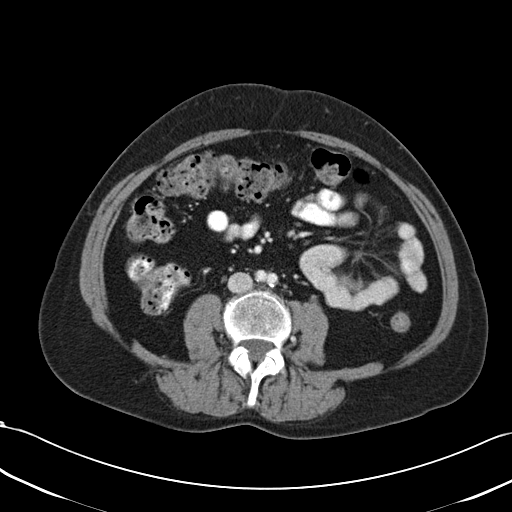
[im 54/90  soft-tissue]
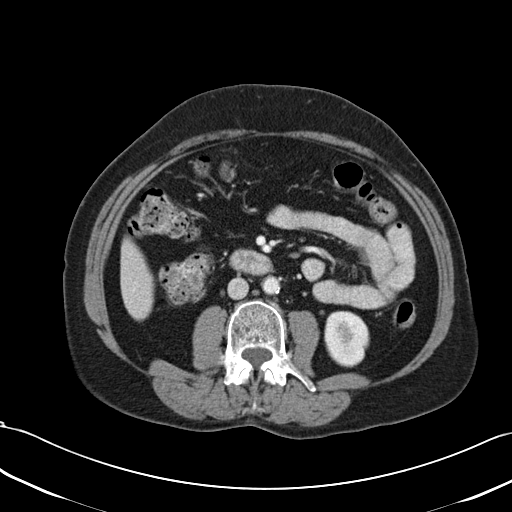
[im 54/90  bone]
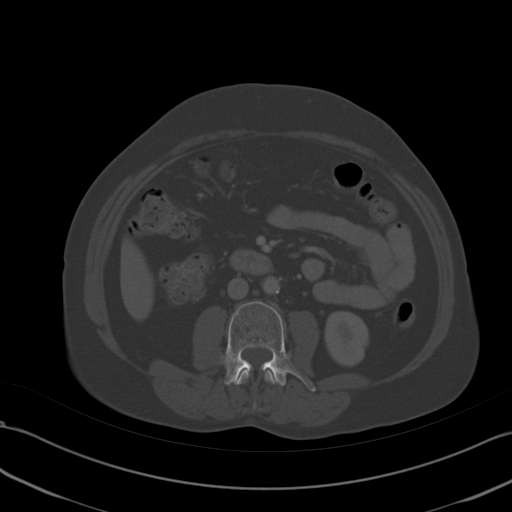
[im 58/90  soft-tissue]
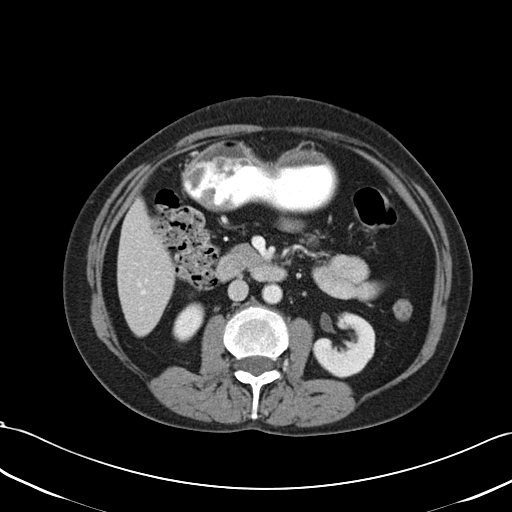
[im 67/90  soft-tissue]
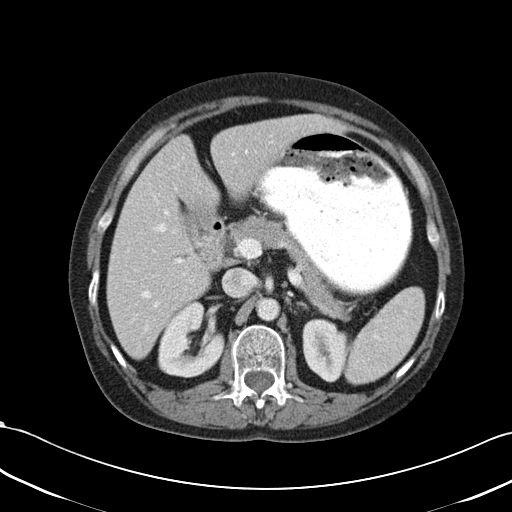
[im 72/90  soft-tissue]
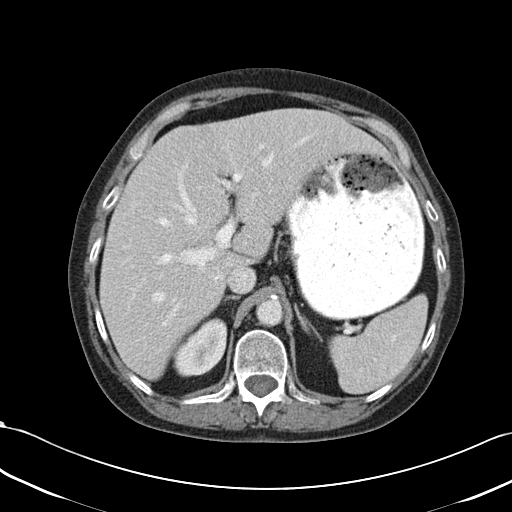
[im 76/90  soft-tissue]
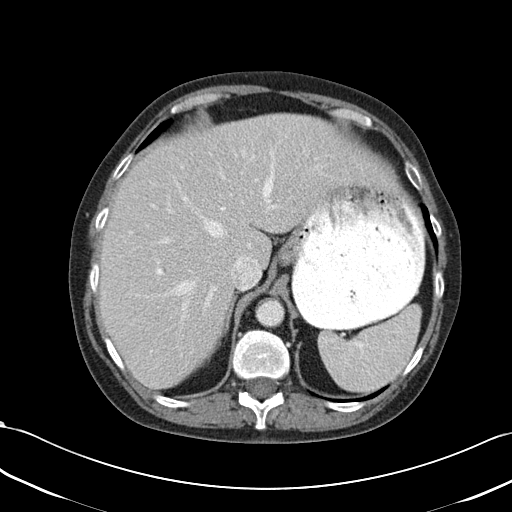
[im 85/90  soft-tissue]
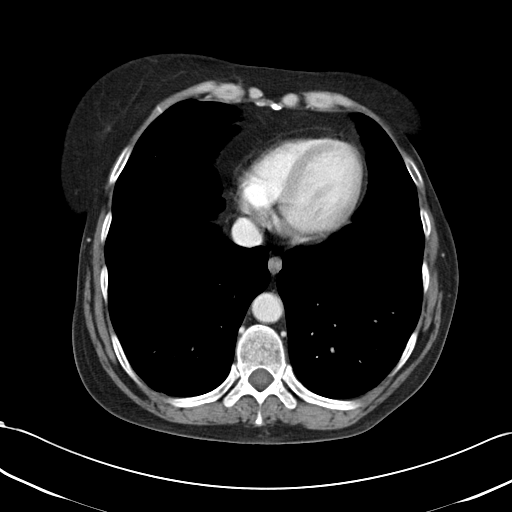

[Series 602: <mpr thick range> · coronal · 0.87mm/px · 3 of 78 slices shown]
[im 26/78  soft-tissue]
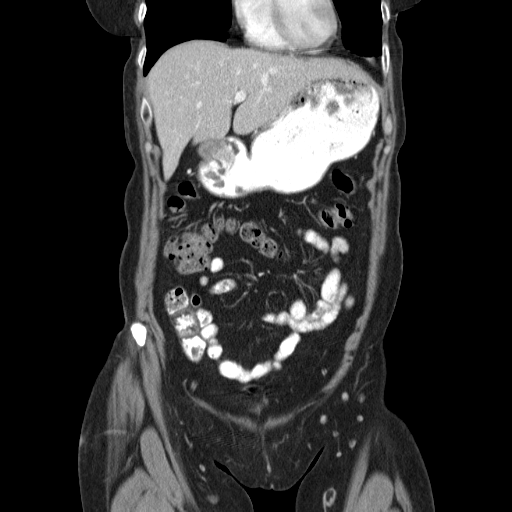
[im 35/78  soft-tissue]
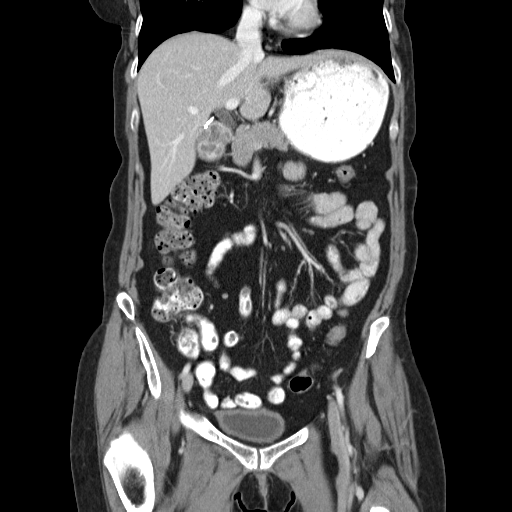
[im 43/78  soft-tissue]
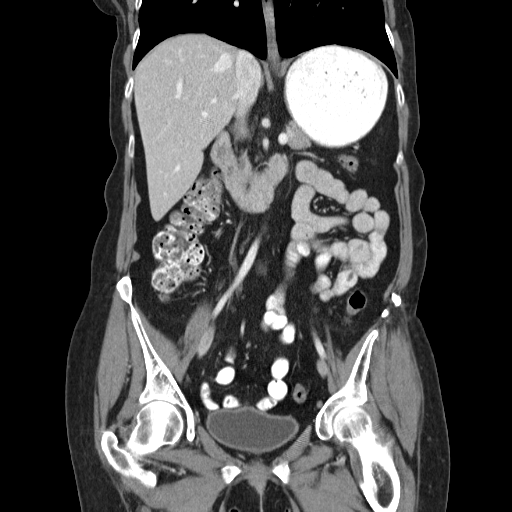

[17 of 46 positions shown; findings below may reference images not displayed]

FINDINGS: Lung bases are clear.
12 mm hypodensity in the left lateral hepatic lobe is unchanged
from prior.  Prior cholecystectomy.  Mild dilatation of the common
bile duct to 7 mm is decreased slightly from prior.  The pancreas
appears normal.

The spleen, adrenal glands, and kidneys appear normal.

No evidence of abdominal lymphadenopathy.  The bowel is normal in
course and caliber.  Appendix is not visualized.
IMPRESSION: 1. No acute abdominal process.
2.  Prior cholecystectomy.  3. Stable hepatic hypodensity.

To CT PELVIS
FINDINGS: No free fluid or adenopathy in the pelvis.  Prior
hysterectomy.  Bladder appears normal.  Review of bone windows
demonstrates a well-circumscribed defects within the iliac wings
unchanged from prior.  No aggressive osseous lesions.
IMPRESSION: 1.  No acute pelvic findings.

## 2010-04-01 IMAGING — CR DG FOOT EXAM
1 series · 1 of 1 positions shown · non-contrast
Comparison: NONE

CLINICAL DATA: Injury. 

LEFT GREAT TOE

[view not recorded]
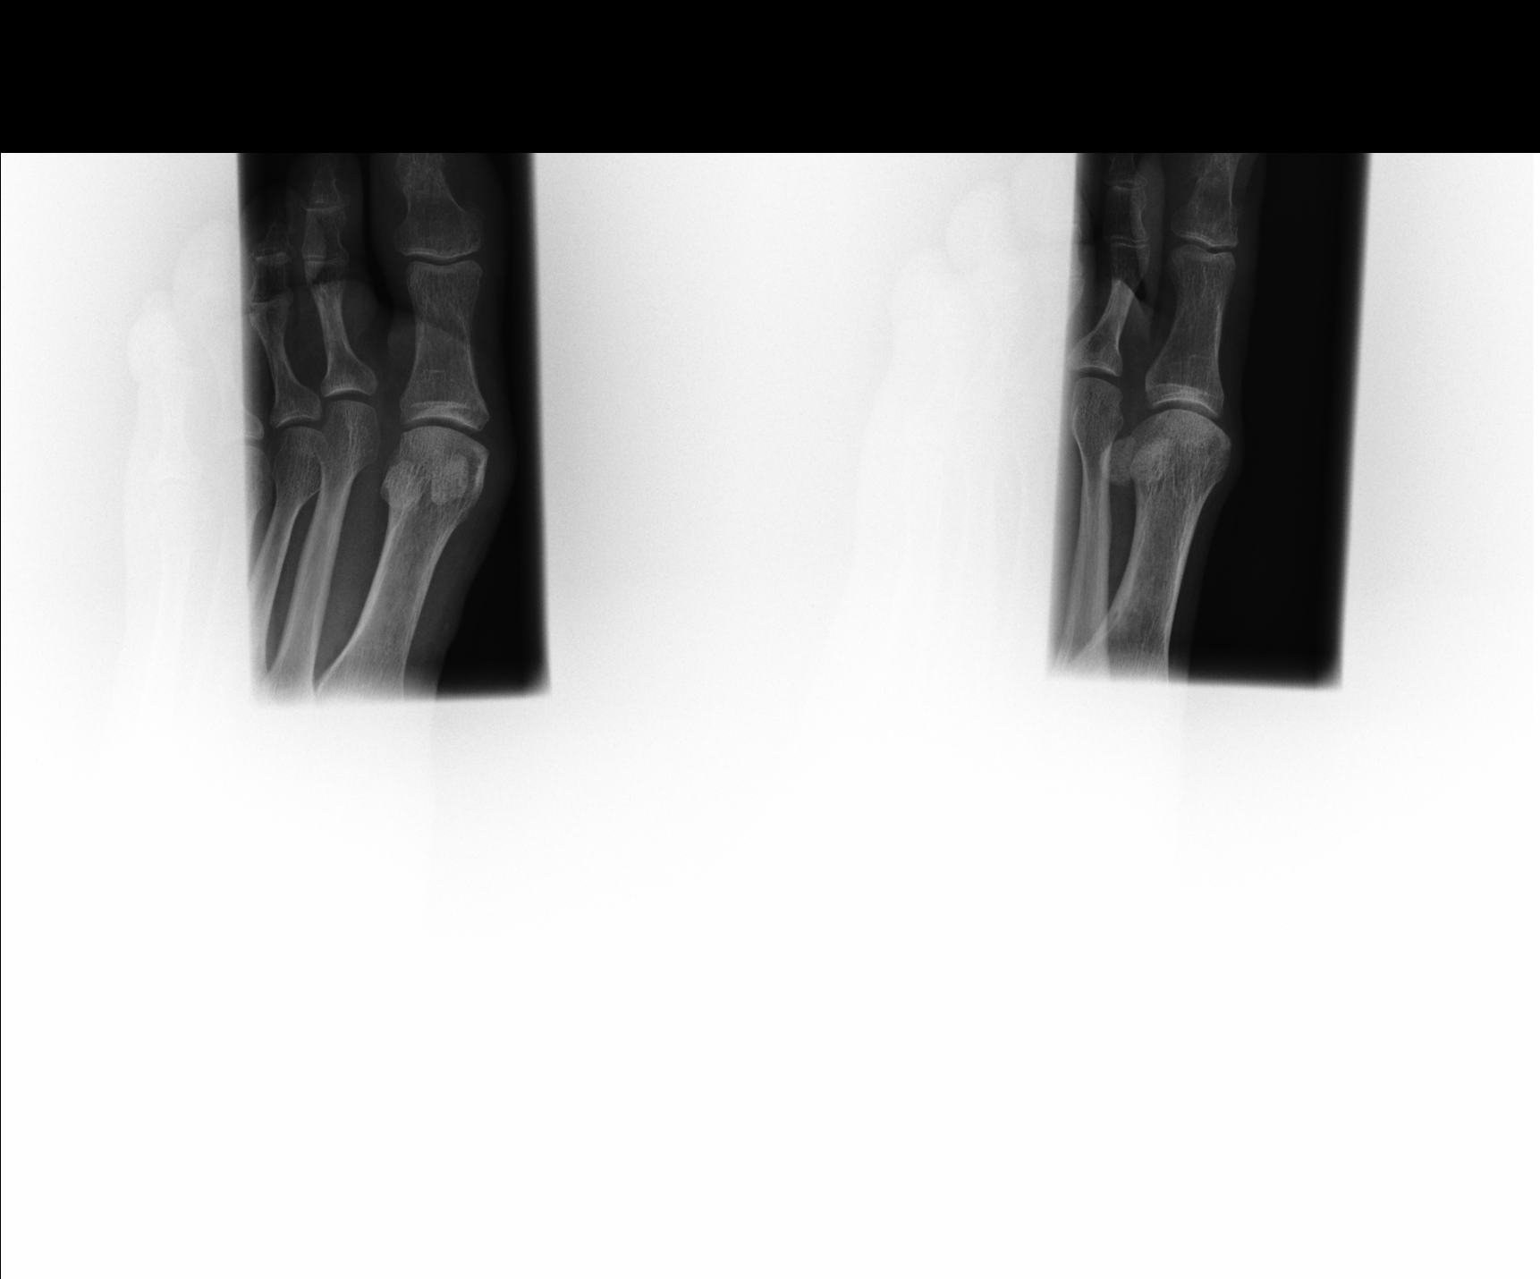

[1 of 1 positions shown; findings below may reference images not displayed]

FINDINGS: There is a non-displaced oblique fracture at the base 
of the distal phalanx, extending to the articular surface. No 
other findings.
IMPRESSION: Non-displaced distal phalanx fracture, left great 
toe. Godet, Alerte electronically reviewed on 02/08/2008 
Dict Date: 02/06/2008  Tran Date:  02/08/2008 DAS  [REDACTED]

## 2010-04-20 ENCOUNTER — Encounter: Admission: RE | Admit: 2010-04-20 | Discharge: 2010-04-20 | Payer: Self-pay | Admitting: Orthopaedic Surgery

## 2010-08-07 IMAGING — CT CT UROGRAM
2 of 3 series · 15 of 42 positions shown, 19 images · non-contrast
Comparison: NONE

CLINICAL DATA: Hd, Diadji Recurrent urinary tract 
infections  and flank pain. 

CT UROGRAM
TECHNIQUE: Thin-section unenhanced axial images were obtained to 
provide a CT urogram to evaluate for possible urinary tract stone. 
 Scan thicknesses were 3 mm with 3-mm increments.

[Series 2: wo · axial · 0.60mm/px · z∈[+890,+1247]mm · 12 of 133 slices shown, 16 images]
[im 7/133  soft-tissue]
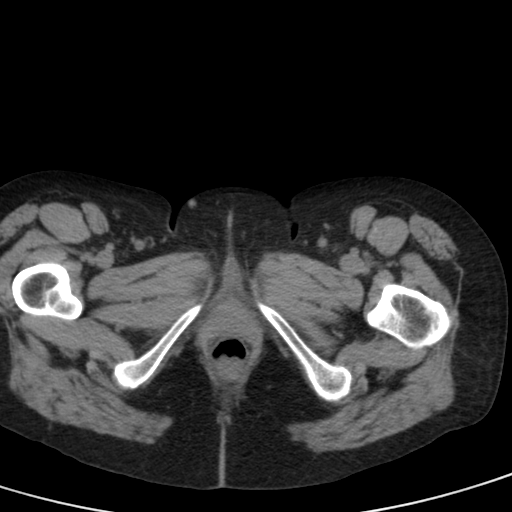
[im 7/133  bone]
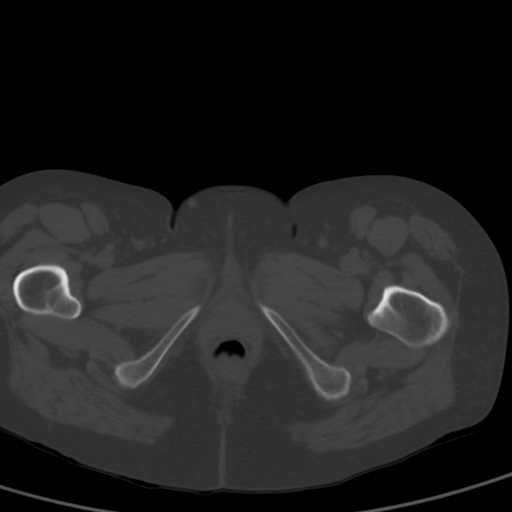
[im 21/133  soft-tissue]
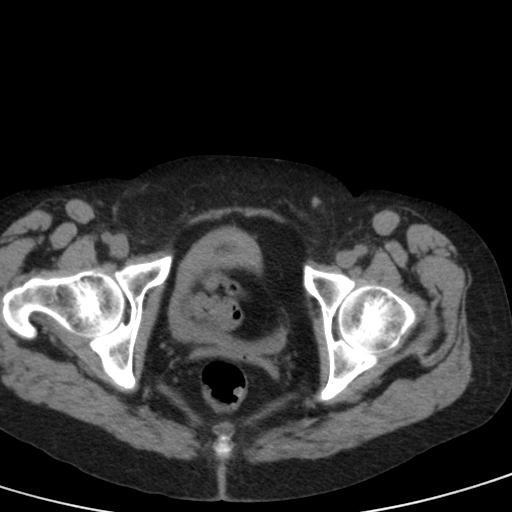
[im 35/133  soft-tissue]
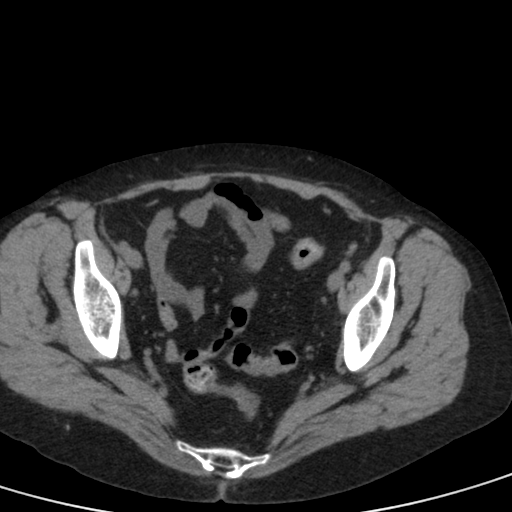
[im 49/133  soft-tissue]
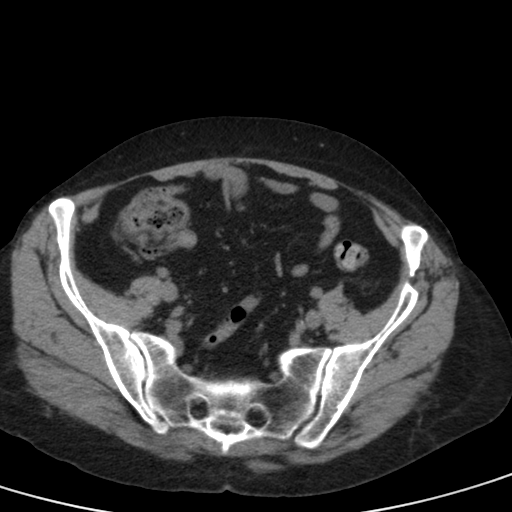
[im 63/133  soft-tissue]
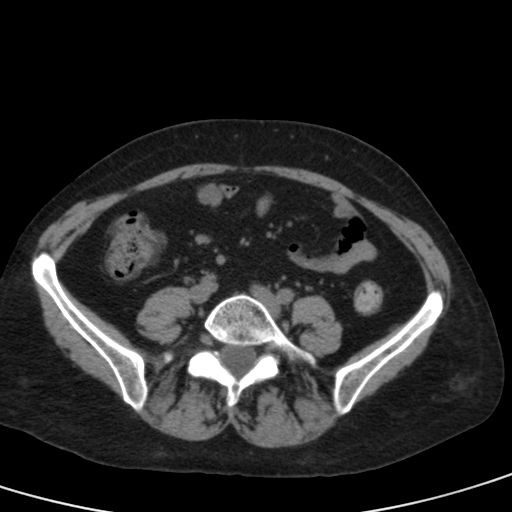
[im 70/133  soft-tissue]
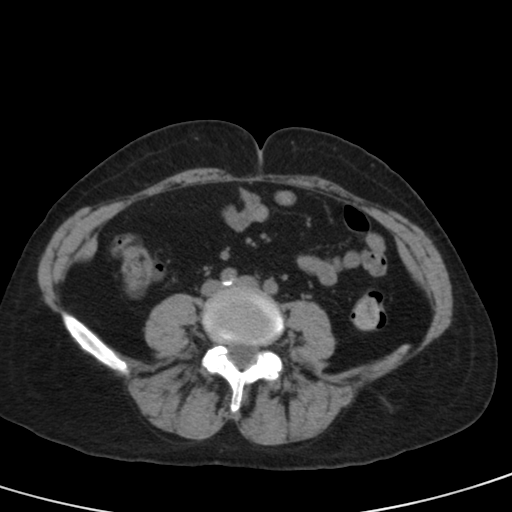
[im 84/133  soft-tissue]
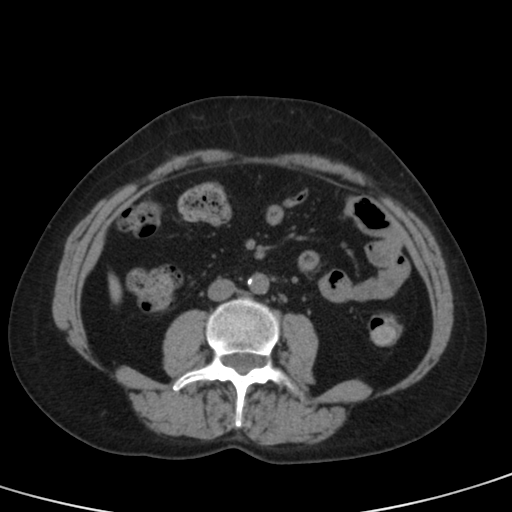
[im 98/133  soft-tissue]
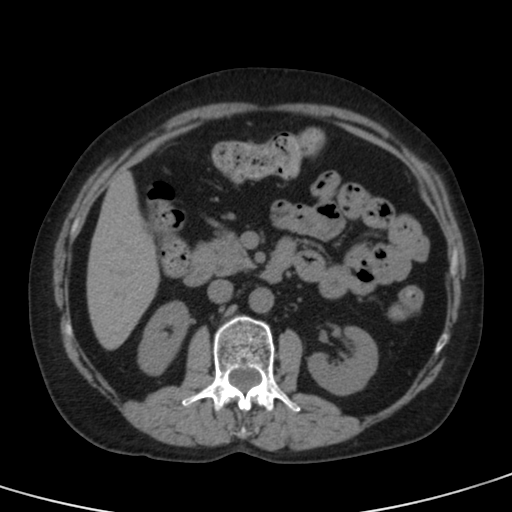
[im 105/133  lung]
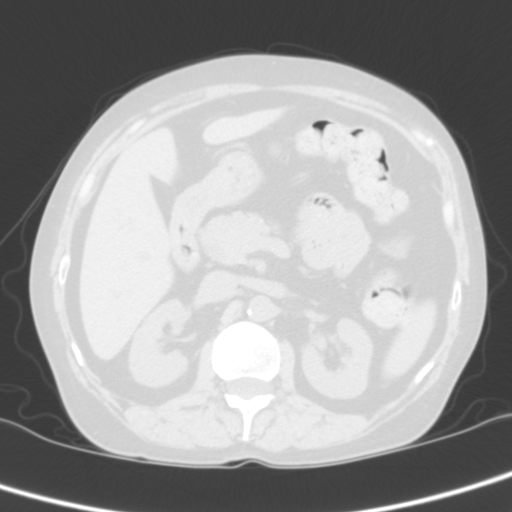
[im 112/133  soft-tissue]
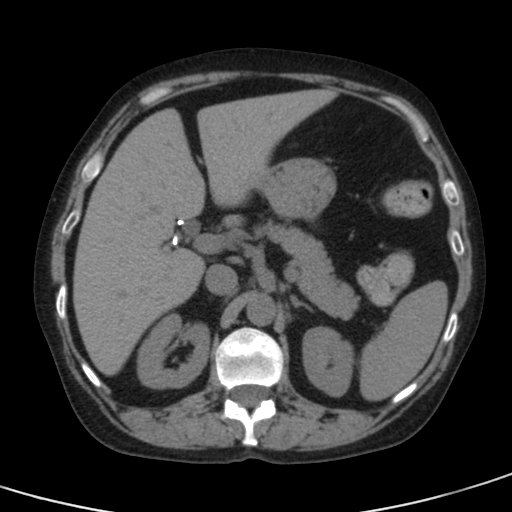
[im 112/133  lung]
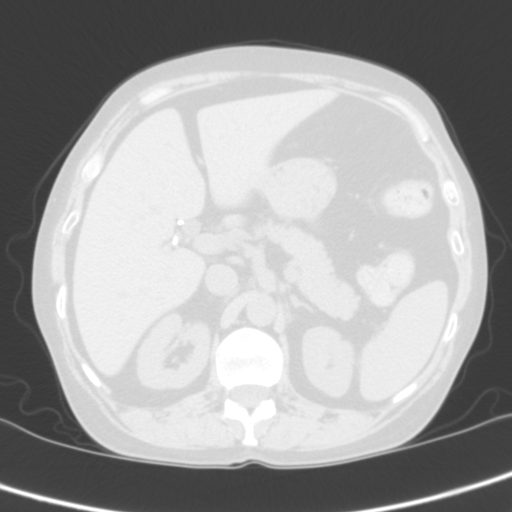
[im 112/133  bone]
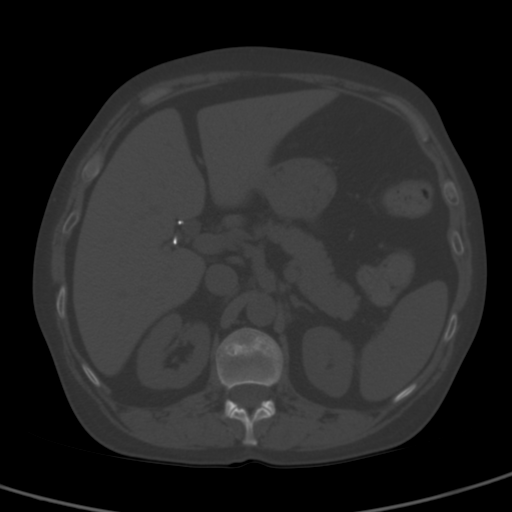
[im 119/133  lung]
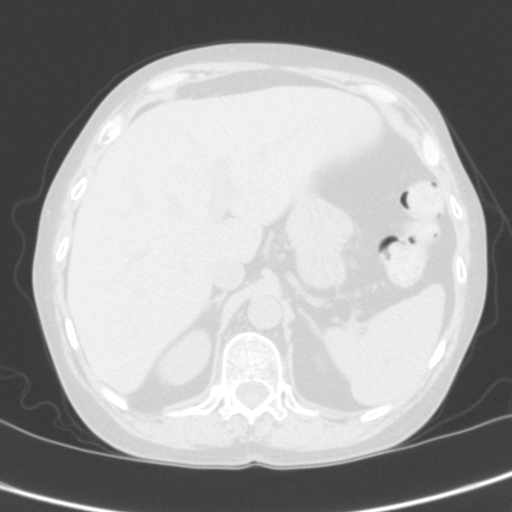
[im 126/133  soft-tissue]
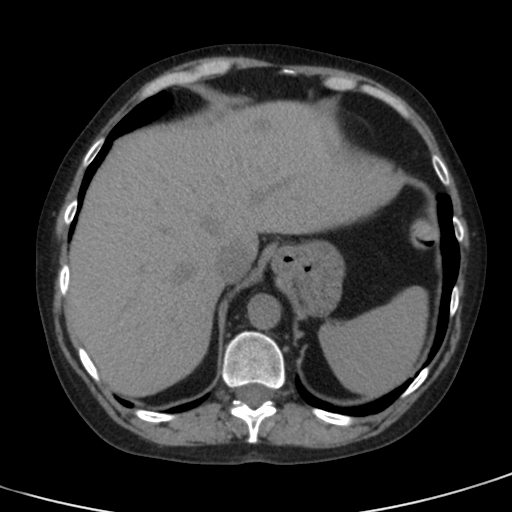
[im 126/133  lung]
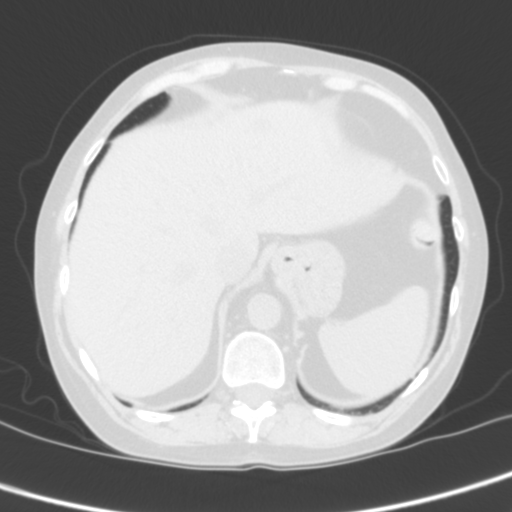

[coronals · coronal · 0.77mm/px · 3 of 79 slices shown]
[im 27/79  soft-tissue]
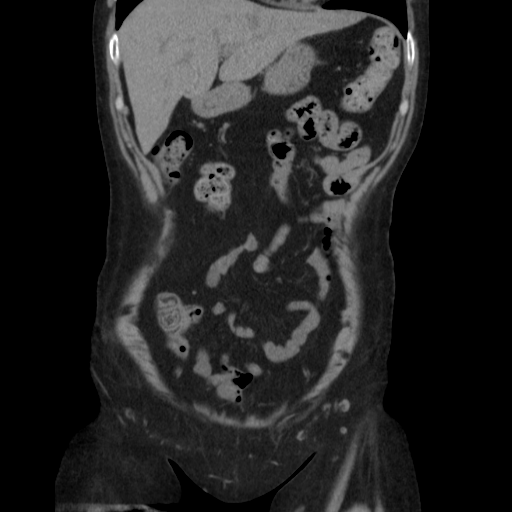
[im 35/79  soft-tissue]
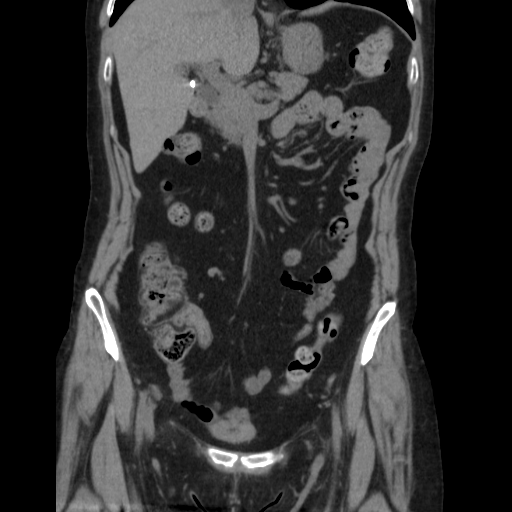
[im 44/79  soft-tissue]
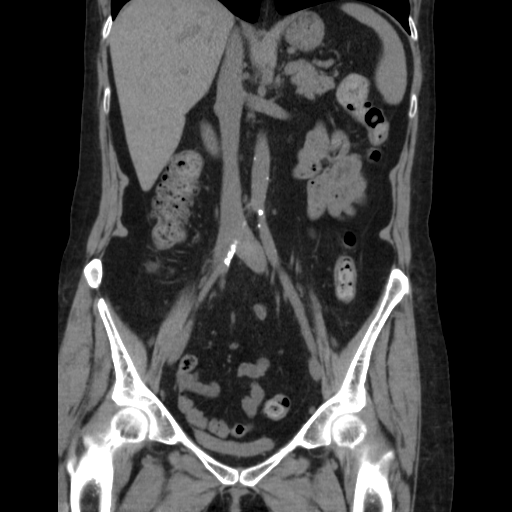

[15 of 42 positions shown; findings below may reference images not displayed]

FINDINGS: Postcholecystectomy clips are noted in the right upper 
quadrant. No renal or ureteral calculi are identified.  Liver, 
pancreas, and spleen are unremarkable.  No upper abdominal or 
retroperitoneal mass, adenopathy, or aneurysm.  Faint aortoiliac 
atherosclerosis is noted. No evidence of appendicitis, 
diverticulitis, hernia, or bowel obstruction. No lung base mass, 
infiltrate, edema, or effusion.  No lytic or blastic lesions are 
identified.
IMPRESSION: Postcholecystectomy clips are present in the right 
upper quadrant.  No mass, adenopathy, or inflammatory process. 
Date: 06/14/2008  Tran Date:  06/14/2008 DAS  JLM

## 2010-09-14 IMAGING — CR DG ABDOMEN 1V
1 series · 2 of 2 positions shown · non-contrast
Comparison: NONE

CLINICAL DATA: Lower abdominal pain. 

KUB

[Series 1: view not recorded · 0.17mm/px · 2 of 2 slices shown]
[im 1/2]
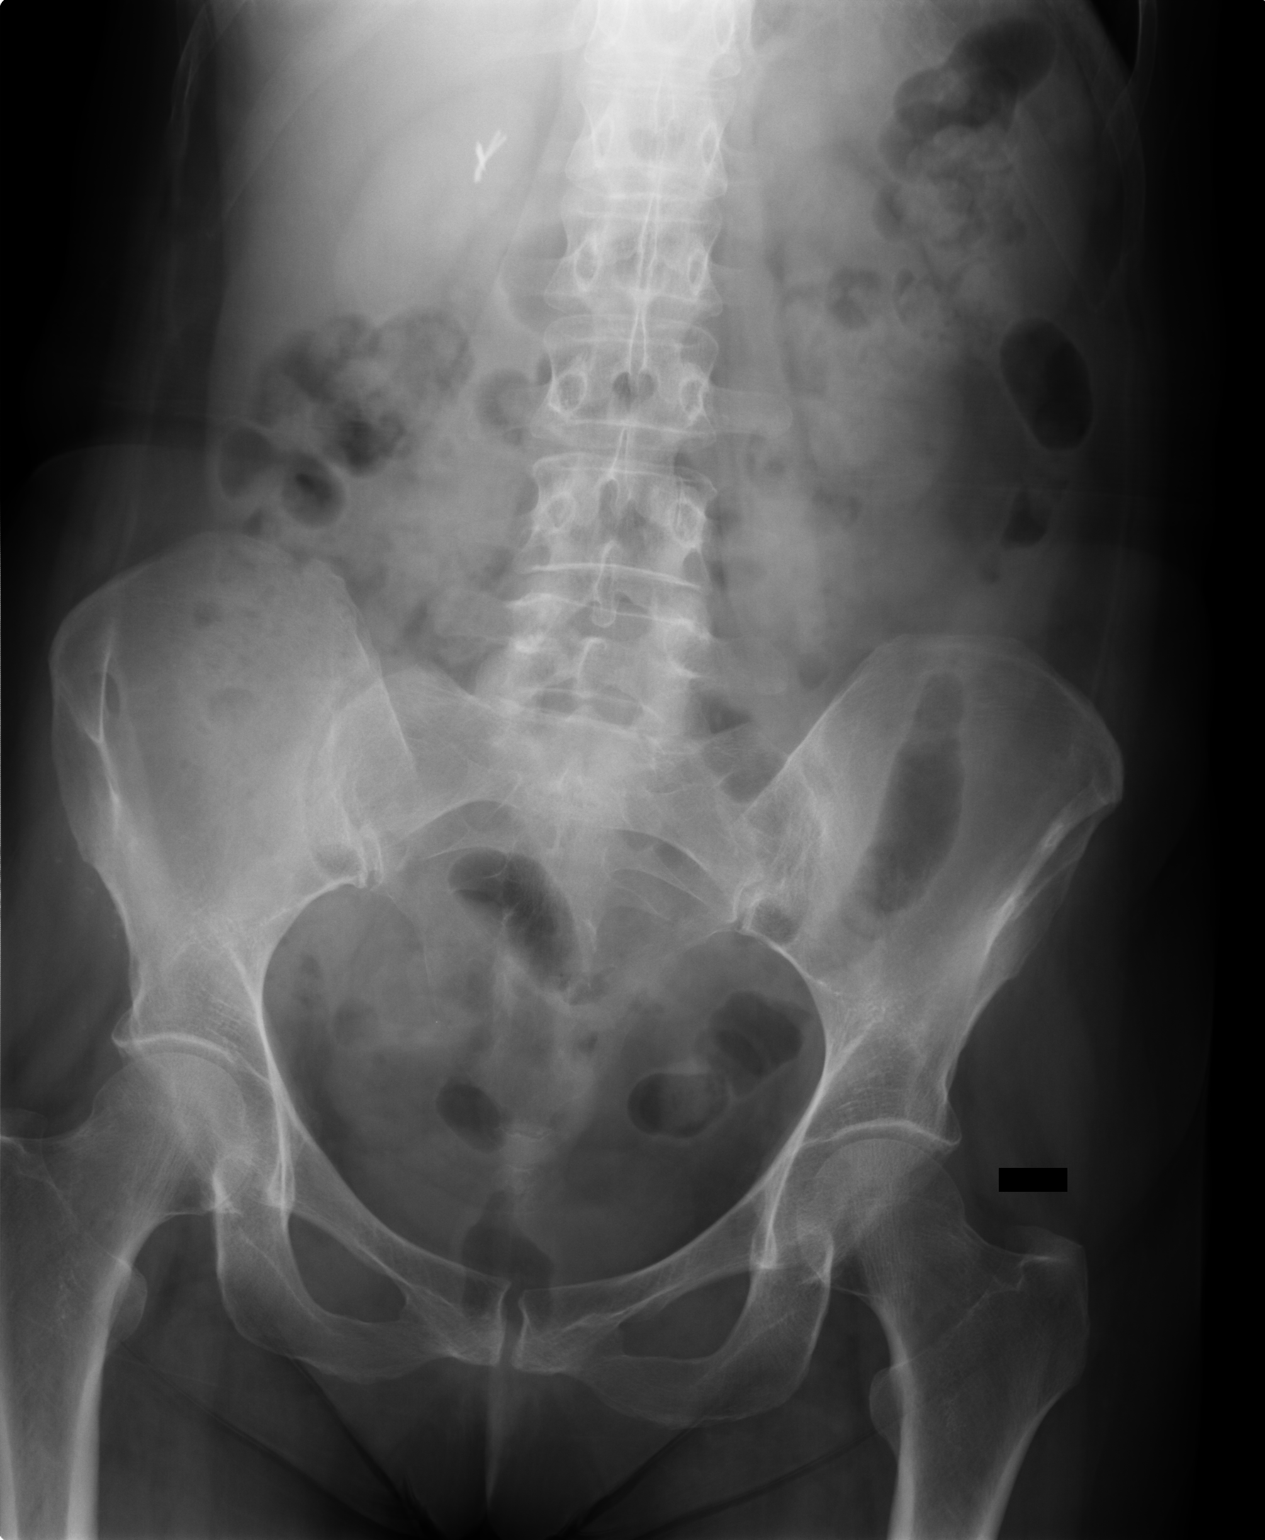
[im 2/2]
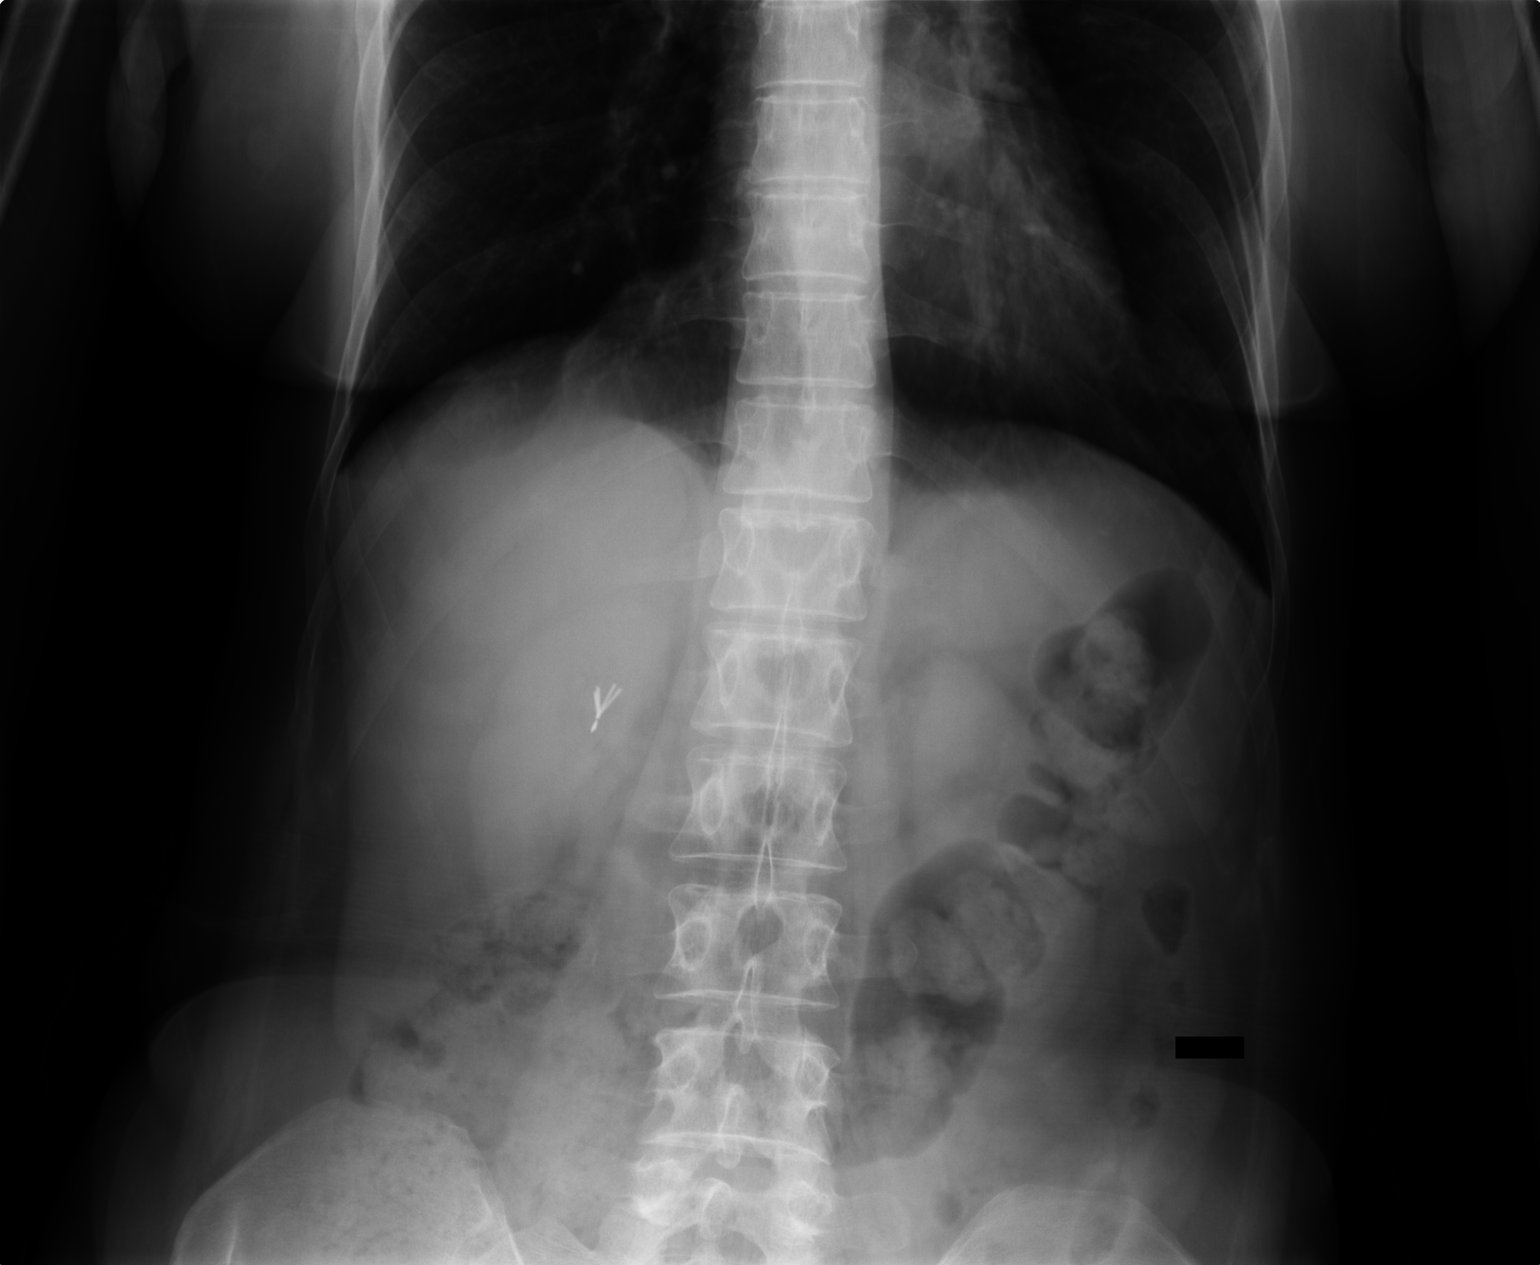

[2 of 2 positions shown; findings below may reference images not displayed]

FINDINGS: Clips are present in the right upper quadrant. The 
bowel gas pattern is normal. Soft tissues are unremarkable. Bones 
are normal. Density at the right cardiophrenic angle is stable 
when compared to the prior chest dated 11/10/06 consistent with 
fat pad.
IMPRESSION: No acute abnormality. Roofs Seta, M.D. 
electronically reviewed on 07/21/2008 Dict Date: 07/21/2008  Tran 
Date: 07/21/2008 CAV  [REDACTED]

## 2010-10-06 ENCOUNTER — Encounter: Payer: Self-pay | Admitting: Internal Medicine

## 2010-10-06 ENCOUNTER — Encounter: Payer: Self-pay | Admitting: Family Medicine

## 2010-10-07 ENCOUNTER — Encounter: Payer: Self-pay | Admitting: Internal Medicine

## 2010-10-16 NOTE — Assessment & Plan Note (Signed)
Summary: f/u medications--ch.  Medications Added METOCLOPRAMIDE HCL 5 MG  TABS (METOCLOPRAMIDE HCL) 1 by mouth three times a day before meals,  If tolerated may increase take at hs also.        History of Present Illness Visit Type: Follow-up Visit Primary GI MD: Sheryn Bison MD FACP FAGA Primary Provider: Lerry Liner, MD Chief Complaint: Patient is here to discuss alternatives to domperidone because she cannot afford the domperidone. She states that she has problems with bloating and constipation when not on the medicaitons. History of Present Illness:   Lori [name] has proven gastroparesis per her recent gastric emptying scan. She also had rather remarkable improvement with domperidone 10 mg t.i.d. However, she cannot afford this medication and wishes an alternative drug. I discussed with her husband the use of metoclopramide in low doses to be advanced as tolerated. I have explained the black box warning concerning dyskinesia, tremor, and other neurological side effects associated with this class of medications. We will start off with low dose and advance as tolerated.   GI Review of Systems    Reports abdominal pain, acid reflux, bloating, and  heartburn.     Location of  Abdominal pain: generalized.    Denies belching, chest pain, dysphagia with liquids, dysphagia with solids, loss of appetite, nausea, vomiting, vomiting blood, weight loss, and  weight gain.      Reports constipation and  irritable bowel syndrome.     Denies anal fissure, black tarry stools, change in bowel habit, diarrhea, diverticulosis, fecal incontinence, heme positive stool, hemorrhoids, jaundice, light color stool, liver problems, rectal bleeding, and  rectal pain.    Current Medications (verified): 1)  Kapidex 60 Mg  Cpdr (Dexlansoprazole) .... Take One Capsule By Mouth 30 Minutes Before Breakfast 2)  Trilipix .... Take 1 Tablet By Mouth Once A Day 3)  Hydrochlorothiazide 25 Mg Tabs (Hydrochlorothiazide) ....  Take 1 Tablet By Mouth Once A Day 4)  Valtrex 1 Gm Tabs (Valacyclovir Hcl) .... Take 1 Tablet By Mouth Once A Day 5)  Nitroglycerin (Unknown Dosage) .... Take As Needed For Chest Pain  Allergies: 1)  ! Codeine 2)  ! Darvocet 3)  ! Vicodin  Past History:  Past Medical History: Reviewed history from 11/18/2008 and no changes required. Current Problems:  HEPATIC CYST (ICD-573.8) HYPERLIPIDEMIA (ICD-272.4) COPD (ICD-496) ASTHMA, UNSPECIFIED, UNSPECIFIED STATUS (ICD-493.90)  Past Surgical History: c-section x 2 hysterectomy bilateral ear surgery tonsilectomy cholecystectomy back surgery right elbow surgery left hand surgery PMH-FH-SH reviewed for relevance  Social History: Occupation: Disabled Patient currently smokes. -1ppd Alcohol Use - no Daily Caffeine Use-2-3 cups daily Illicit Drug Use - no Patient gets regular exercise.  Vital Signs:  Patient profile:   55 year old female Height:      64 inches Weight:      115.25 pounds BMI:     19.85 BSA:     1.55 Pulse rate:   88 / minute Pulse rhythm:   regular BP sitting:   102 / 80  (left arm)  Vitals Entered By: Hortense Ramal CMA Duncan Dull) (October 26, 2009 11:00 AM)  Physical Exam  General:  Well developed, well nourished, no acute distress. Head:  Normocephalic and atraumatic. Eyes:  PERRLA, no icterus.exam deferred to patient's ophthalmologist.   Psych:  Alert and cooperative. Normal mood and affect.   Impression & Recommendations:  Problem # 1:  GASTROPARESIS (ICD-536.3) Assessment Improved Try generic metoclopramide 5 mg t.i.d. 30 minutes before meals and at bedtime if tolerated. Also gastroparesis  diet reviewed with patient. She is to call immediately she has a complication from this therapy otherwise a progress report in 2 weeks time.  Problem # 2:  NAUSEA AND VOMITING (ICD-787.01) Assessment: Improved  Problem # 3:  ASTHMA, UNSPECIFIED, UNSPECIFIED STATUS (ICD-493.90) Assessment: Improved Related 2  GERD and has improved on Kapidex 60 mg a day.  Patient Instructions: 1)  Copy sent to : Dr. Lerry Liner 2)  Please continue current medications.  3)  generic Reglan 5 mg 30 minutes before meals as tolerated with bedtime use if needed and tolerated 4)  Please call our GI Office back in 2 weeks with a follow-up of symptoms. 5)  Liquids and foods should be eaten in small, frequent meals. Refer to brochure for further instruction.  6)  The medication list was reviewed and reconciled.  All changed / newly prescribed medications were explained.  A complete medication list was provided to the patient / caregiver. Prescriptions: METOCLOPRAMIDE HCL 5 MG  TABS (METOCLOPRAMIDE HCL) 1 by mouth three times a day before meals,  If tolerated may increase take at hs also.  #120 x 6   Entered by:   Ashok Cordia RN   Authorized by:   Mardella Layman MD [facility]   Signed by:   Ashok Cordia RN on 10/26/2009   Method used:   Electronically to        [id number] (retail)       [address] [place], [place]  [address]       Ph: [phone]       Fax: [phone]   RxID:   [id number]

## 2010-10-16 NOTE — Progress Notes (Signed)
Summary: ? re meds   Phone Note Call from Patient Call back at Home Phone [phone]   Caller: Patient Call For: Jarold Motto Reason for Call: Talk to Nurse Summary of Call: Patient has questions regarding medication.  Initial call taken by: Tawni Levy,  October 23, 2009 4:09 PM  Follow-up for Phone Call        Pt asks if there is any thing else she can take for her stomach.  She can not afford the Domperidone.  Pt is on medicaid and it will not cover Domperidone since it comes form [place].  Asking if med can be changed to something that she can get at a local pharmacy and that medicaid will cover. Follow-up by: Ashok Cordia RN,  October 23, 2009 4:34 PM  Additional Follow-up for Phone Call Additional follow up Details #1::        She went off his visit to discuss side effects of other medications before prescribing alternatives. Additional Follow-up by: Mardella Layman MD [place] 8:24 AM    Additional Follow-up for Phone Call Additional follow up Details #2::    Please schedule OV with Dr. Jarold Motto to discuss medication change. Follow-up by: Ashok Cordia RN,  October 24, 2009 8:32 AM  Additional Follow-up for Phone Call Additional follow up Details #3:: Details for Additional Follow-up Action Taken: Scheduled appt. w/Pt. for 10-26-09 @ 11:00 Additional Follow-up by: Karna Christmas,  October 24, 2009 8:39 AM

## 2010-10-23 ENCOUNTER — Ambulatory Visit (HOSPITAL_BASED_OUTPATIENT_CLINIC_OR_DEPARTMENT_OTHER): Payer: PRIVATE HEALTH INSURANCE | Attending: Internal Medicine

## 2010-10-23 DIAGNOSIS — G4700 Insomnia, unspecified: Secondary | ICD-10-CM | POA: Insufficient documentation

## 2010-10-25 IMAGING — CT CT ABDOMEN W/ CM
1 of 3 series · 13 of 32 positions shown, 18 images · IV contrast (agent unspecified)
Comparison: CT abdomen 12/10/2007

CT ABDOMEN

CLINICAL DATA: Nausea, dizziness.  Abdominal pain.  Vomiting.

CT ABDOMEN AND PELVIS WITH CONTRAST
TECHNIQUE: Multidetector CT imaging of the abdomen and pelvis was
performed using the standard protocol following bolus
administration of intravenous contrast.
Contrast: 100 ml Dmnipaque-G44.

[Series 2: abd_pel 5.0 b40f st · axial · 0.59mm/px · z∈[-422,-42]mm · 13 of 86 slices shown, 18 images]
[im 5/86  soft-tissue]
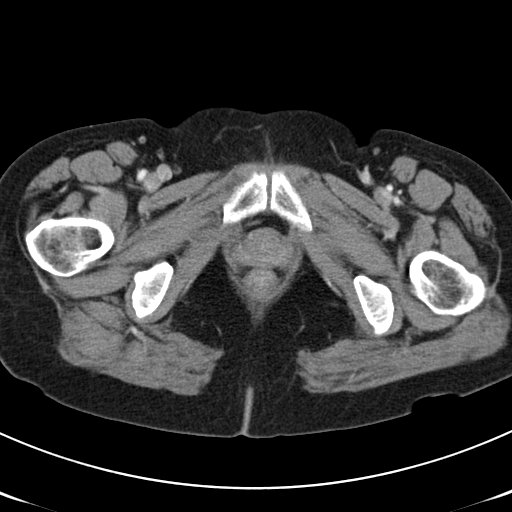
[im 5/86  bone]
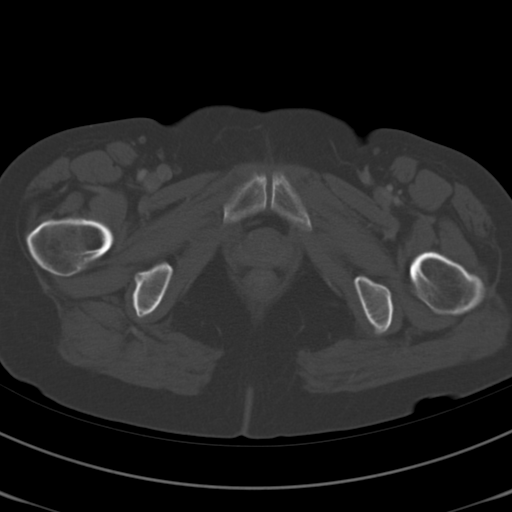
[im 15/86  soft-tissue]
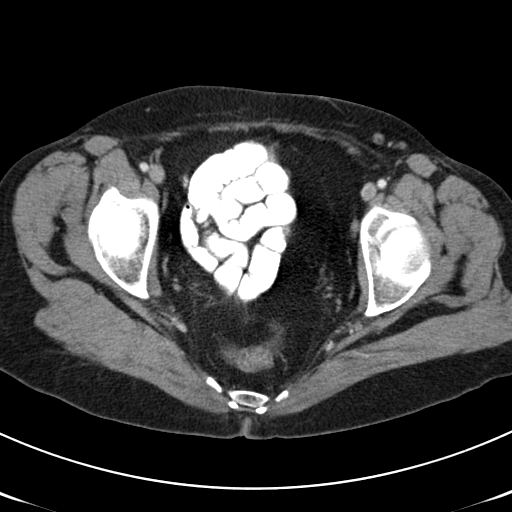
[im 19/86  soft-tissue]
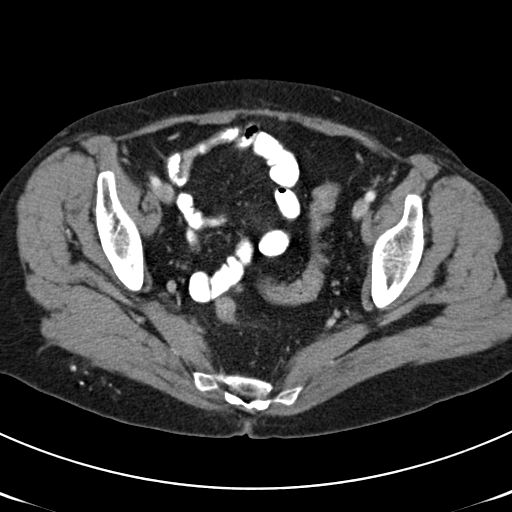
[im 24/86  soft-tissue]
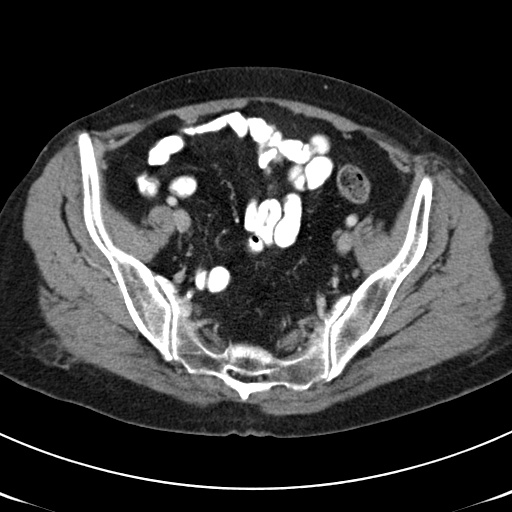
[im 34/86  soft-tissue]
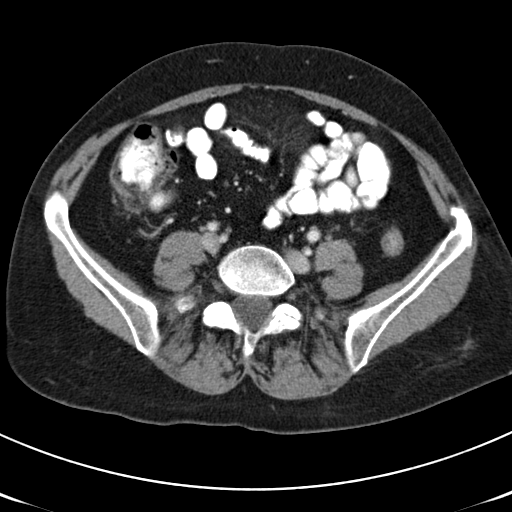
[im 38/86  soft-tissue]
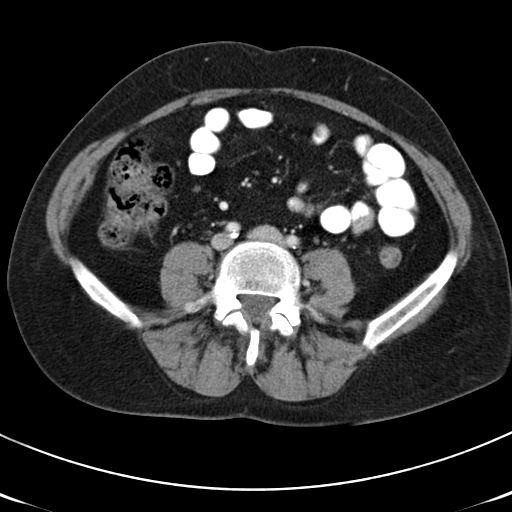
[im 48/86  soft-tissue]
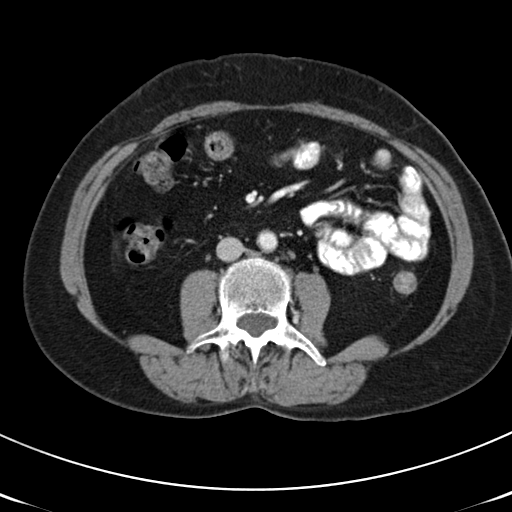
[im 52/86  soft-tissue]
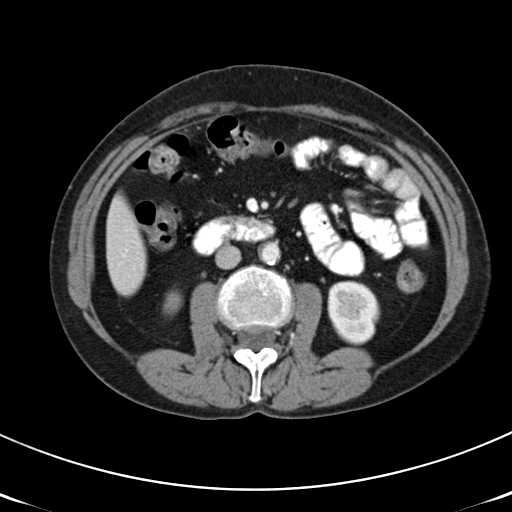
[im 62/86  soft-tissue]
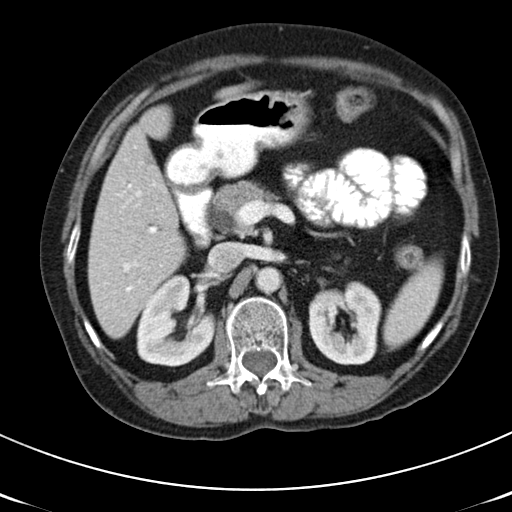
[im 62/86  bone]
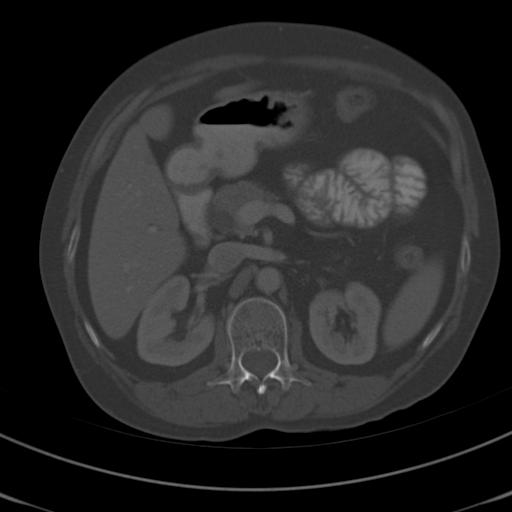
[im 67/86  soft-tissue]
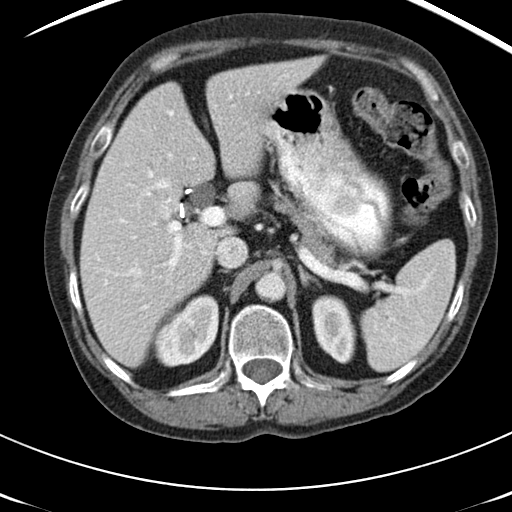
[im 67/86  lung]
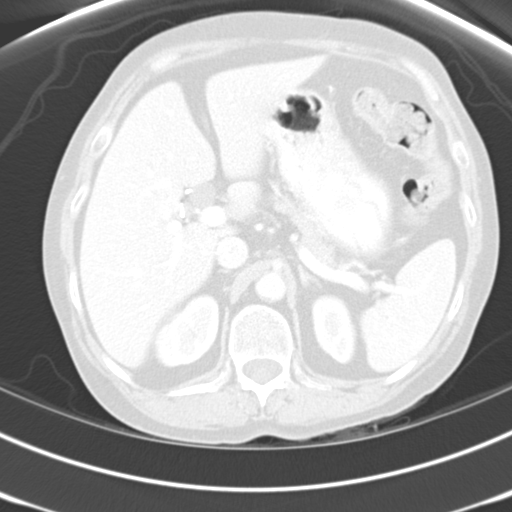
[im 71/86  soft-tissue]
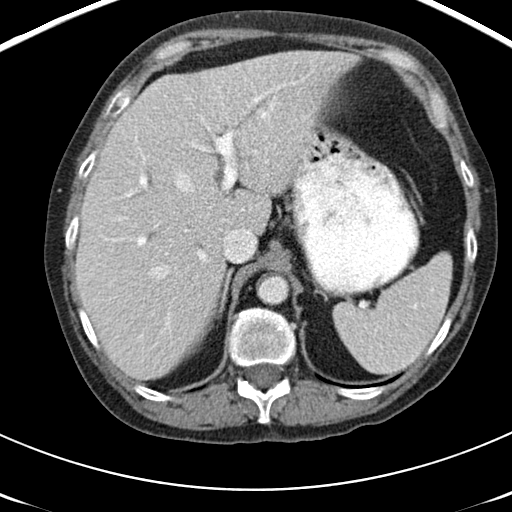
[im 71/86  lung]
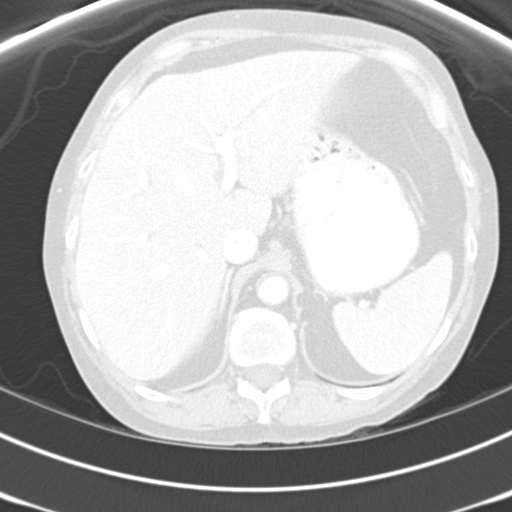
[im 76/86  lung]
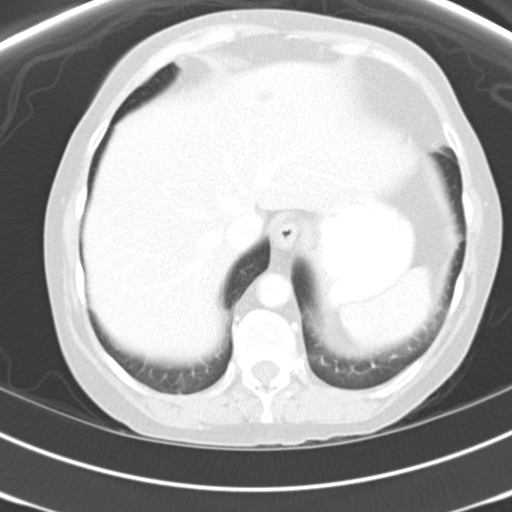
[im 81/86  soft-tissue]
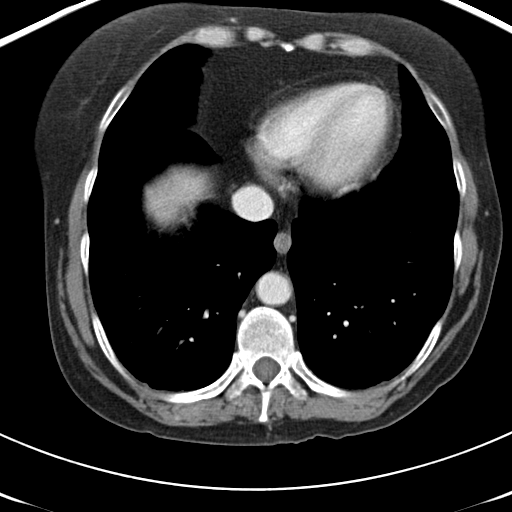
[im 81/86  lung]
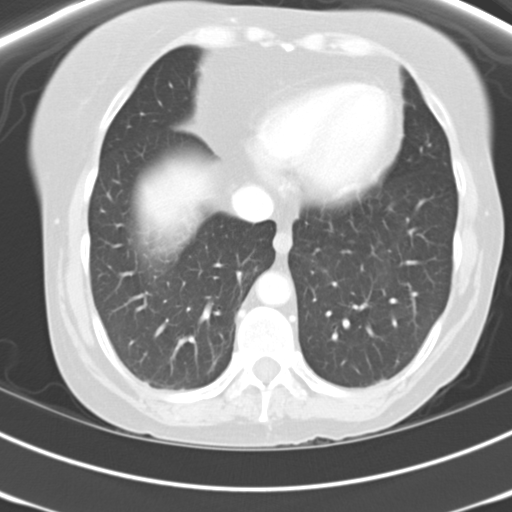

[13 of 32 positions shown; findings below may reference images not displayed]

FINDINGS: Lung bases demonstrate dependent atelectasis.  Stable
1.2 x 0.8 cm low density lesion in the lateral segment of the left
hepatic lobe, likely representing hepatic cyst.  Cholecystectomy.
Mild intrahepatic biliary ductal dilatation.  Extrahepatic biliary
ductal dilatation is present measuring up to 14 mm.  This is larger
than on prior exam and based on intrahepatic biliary ductal
dilatation which was not present on the [REDACTED] exam, correlation
with laboratory results is recommended.  Ampullary stenosis could
potentially cause the degree of biliary ductal dilatation.  The
spleen appears within normal limits.  Pancreas and pancreatic duct
are normal.  Adrenal glands normal bilaterally.  Kidneys
demonstrate normal enhancement and excretion of contrast
bilaterally.  The stomach and duodenum are normal.  Proximal small
bowel normal.  Mild abdominal aortic atherosclerosis without
aneurysm.

Compared to a prior exam of 11/05/2006, the intra and extrahepatic
biliary ductal dilatation was present at that time.  It is unclear
whether this represents recurrent stenosis or fluctuating biliary
ductal dilatation.
IMPRESSION: 1.  Cholecystectomy with intra and extrahepatic duct dilation.
Fairly abrupt tapering of the distal common bile duct at the
ampulla is suggestive of ampullary stenosis.  Correlation with
liver function studies recommended.
2.  Stable 1.2 x 0.8 cm low density lesion in the lateral segment
left hepatic lobe consistent with hepatic cyst.

CT PELVIS
FINDINGS: Iliofemoral atherosclerosis.  The injection granuloma is
in the gluteal subcutaneous fat bilaterally.  Hysterectomy.
Urinary bladder appears within normal limits.  Colon is
decompressed.  Appendix not identified likely normal.  Pelvic small
bowel appears within normal limits.  Bilateral iliac bone foramina
are present.
IMPRESSION: 1.  No acute pelvic abnormality.
2.  Hysterectomy.

## 2010-10-25 IMAGING — CR DG CHEST 2V
2 series · 2 of 2 positions shown · non-contrast
Comparison: 01/15/2007.

CLINICAL DATA: Nausea and vomiting.  Dizziness.

CHEST - 2 VIEW

[w chest lat]
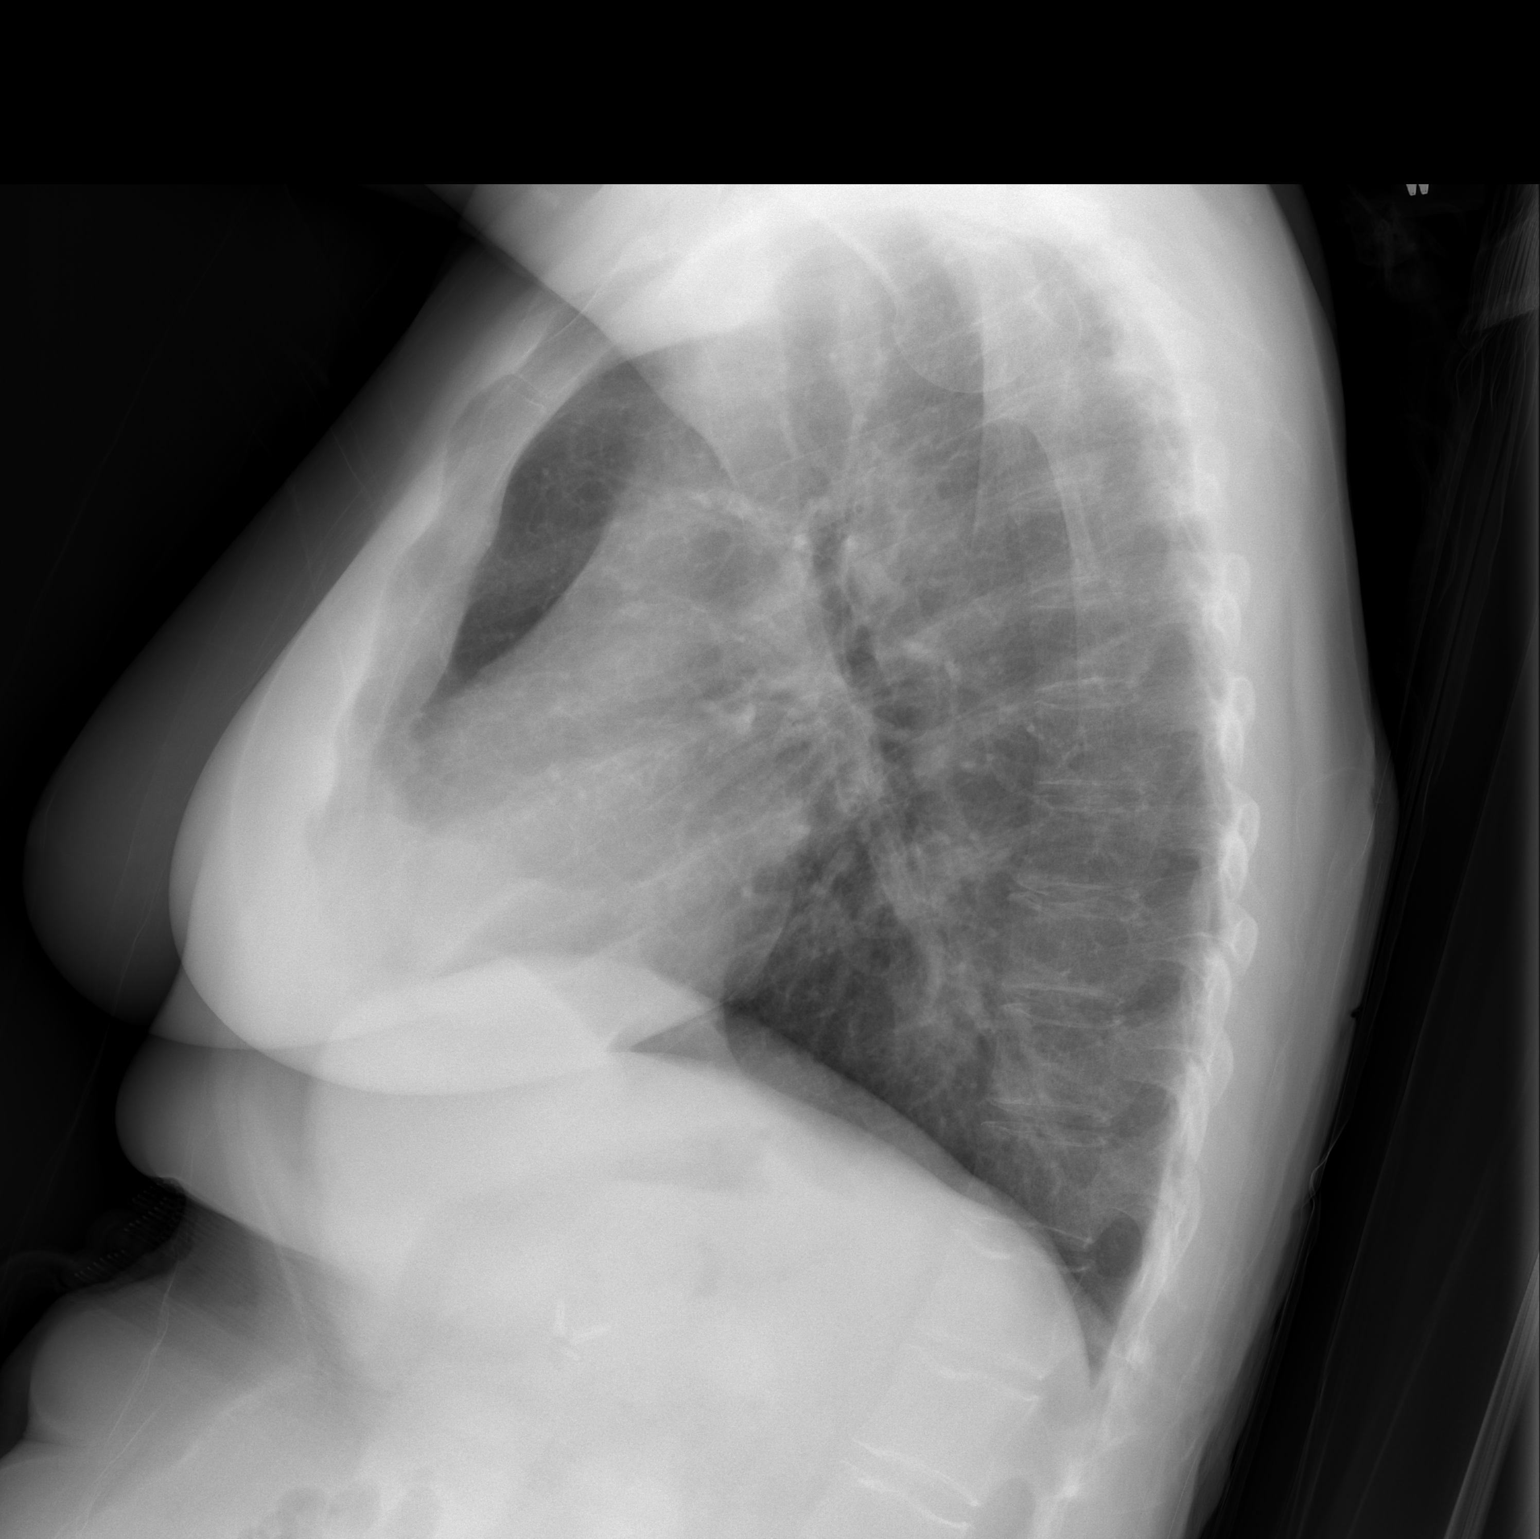

[view not recorded]
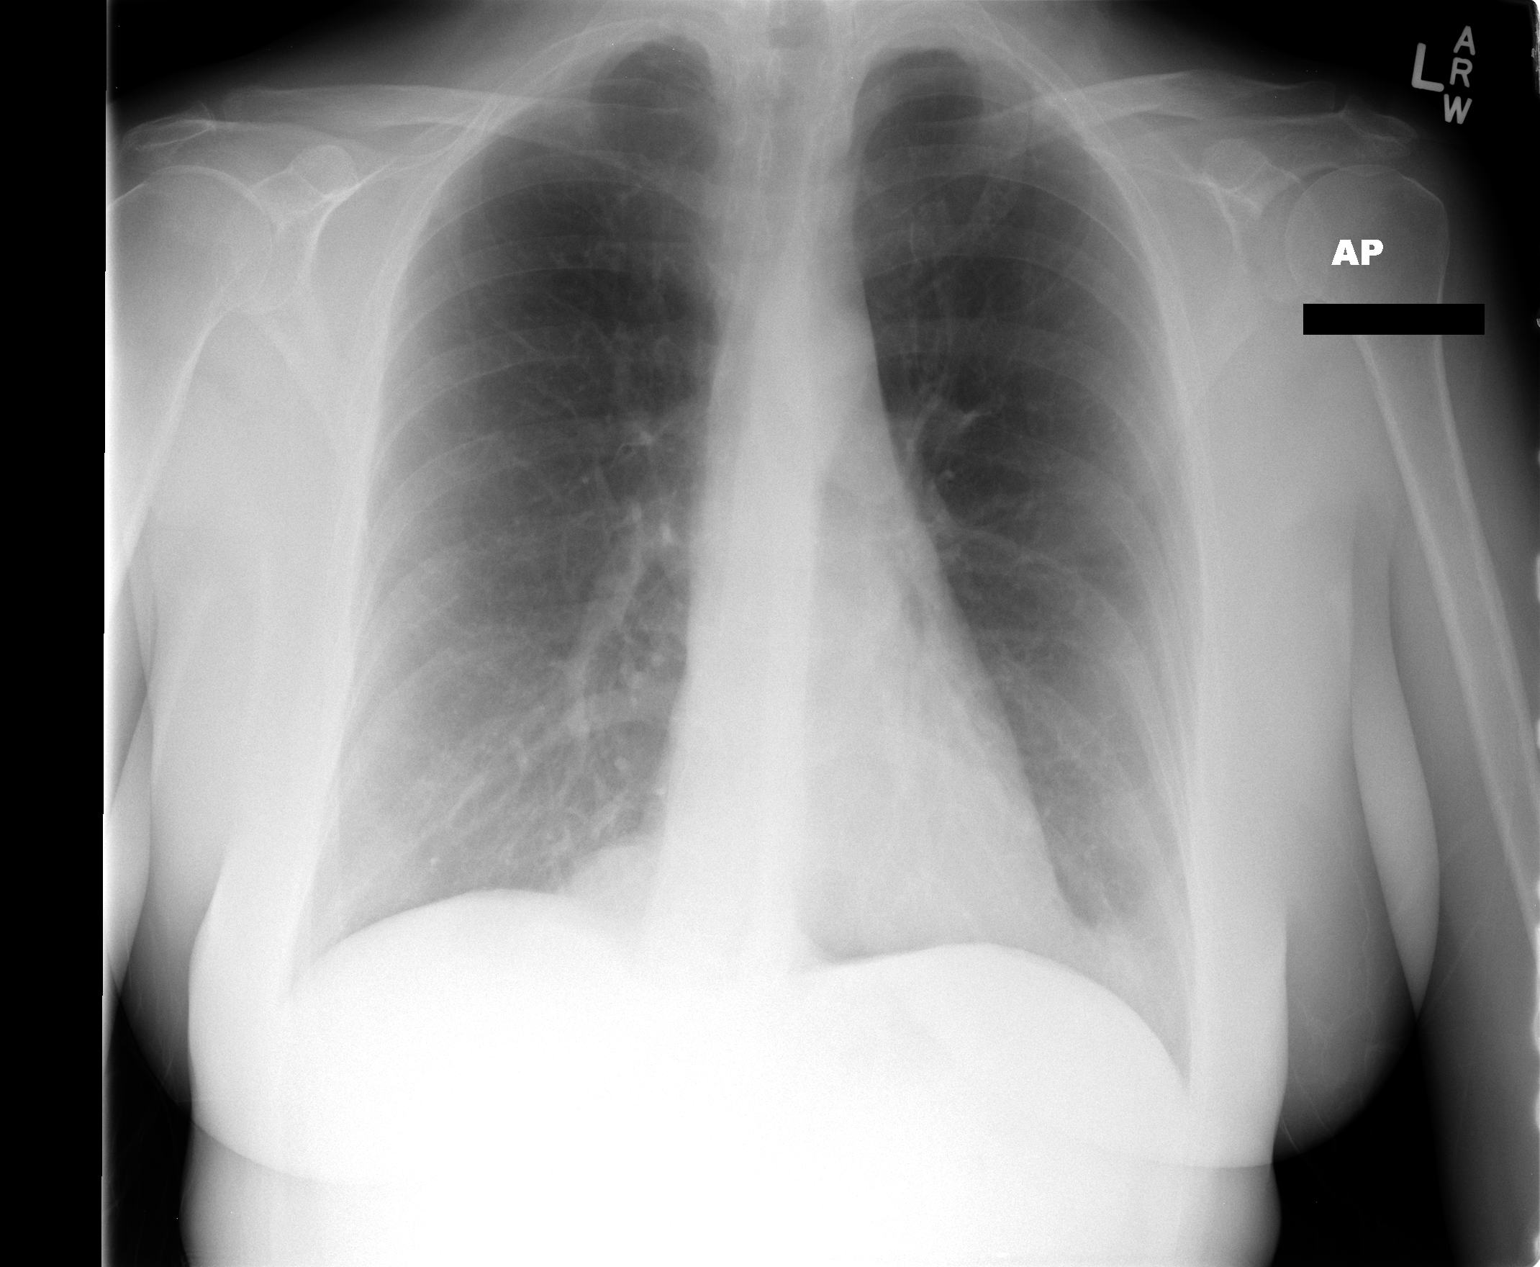

[2 of 2 positions shown; findings below may reference images not displayed]

FINDINGS: Lungs are hyperinflated with changes of COPD.  There is
no acute infiltrate or mass lesion.  Apical pleural scarring is
unchanged.
IMPRESSION: COPD.  No active cardiopulmonary disease.

## 2010-10-27 DIAGNOSIS — G4700 Insomnia, unspecified: Secondary | ICD-10-CM

## 2010-10-27 DIAGNOSIS — G4730 Sleep apnea, unspecified: Secondary | ICD-10-CM

## 2010-12-03 LAB — COMPREHENSIVE METABOLIC PANEL
ALT: 10 U/L (ref 0–35)
AST: 17 U/L (ref 0–37)
Albumin: 4.1 g/dL (ref 3.5–5.2)
Chloride: 107 mEq/L (ref 96–112)
Creatinine, Ser: 0.92 mg/dL (ref 0.4–1.2)
GFR calc Af Amer: >60 mL/min (ref >60)
GFR calc non Af Amer: >60 mL/min (ref >60)
Potassium: 3.7 mEq/L (ref 3.5–5.1)
Sodium: 135 mEq/L (ref 135–145)

## 2010-12-03 LAB — URINE MICROSCOPIC-ADD ON

## 2010-12-03 LAB — LIPASE, BLOOD: Lipase: 23 U/L (ref 11–59)

## 2010-12-03 LAB — URINALYSIS, ROUTINE W REFLEX MICROSCOPIC
Glucose, UA: NEGATIVE mg/dL
Hgb urine dipstick: NEGATIVE
Specific Gravity, Urine: 1.01 (ref 1.005–1.030)

## 2010-12-03 LAB — DIFFERENTIAL
Basophils Relative: 2 % — ABNORMAL HIGH (ref 0–1)
Eosinophils Absolute: 0.1 10*3/uL (ref 0.0–0.7)
Lymphs Abs: 2.8 10*3/uL (ref 0.7–4.0)
Monocytes Relative: 7 % (ref 3–12)
Neutro Abs: 2.4 10*3/uL (ref 1.7–7.7)
Neutrophils Relative %: 41 % — ABNORMAL LOW (ref 43–77)

## 2010-12-03 LAB — CBC
Platelets: 271 10*3/uL (ref 150–400)
WBC: 5.8 10*3/uL (ref 4.0–10.5)

## 2010-12-08 IMAGING — MR MR ABDOMEN WO/W CM
10 of 15 series · 21 of 48 positions shown · IV contrast (eovist)
Comparison: 08/31/2008

CLINICAL DATA: Common bile duct dilatation.

MRI ABDOMEN WITHOUT CONTRAST (MRCP)MRI ABDOMEN WITHOUT AND WITH
CONTRAST
TECHNIQUE: Multiplanar multisequence MR imaging of the abdomen was
performed both before and after the administration of intravenous
contrast. Multiplanar multisequence MR imaging of the abdomen was
performed, including heavily T2-weighted images of the biliary and
pancreatic ducts.  Three-dimensional MR images were rendered by
post processing of the original MR data.
Contrast: 6 ml eovist

[Series 1: 3 plane loc · axial · 8.0mm · 0.78mm/px · 1 of 13 slices shown (1 of 2)]
[im 1/13]
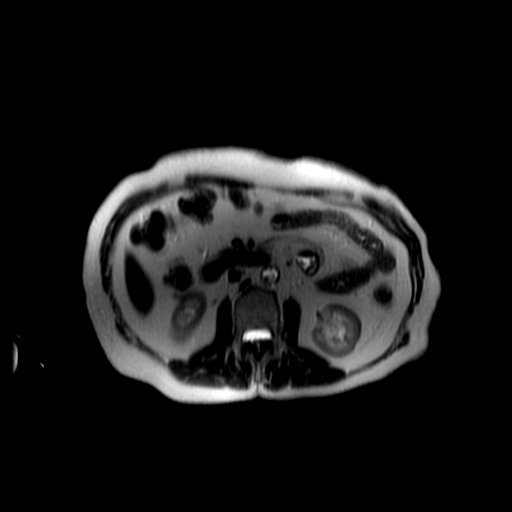

[Series 3: T2 fat-sat · axial · 5.0mm · 0.70mm/px · 1 of 33 slices shown (1 of 2)]
[im 1/33]
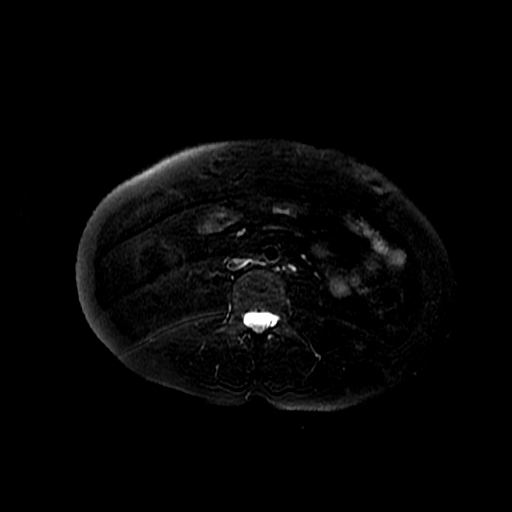

[Series 4: DWI b500 · axial · 5.0mm · 1.41mm/px · z∈[-75,+117]mm · 3 of 66 slices shown]
[im 1/66]
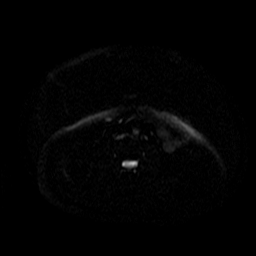
[im 33/66]
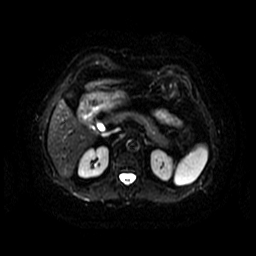
[im 66/66]
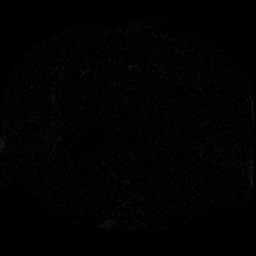

[Series 6: MRCP · axial · 4.0mm · 0.70mm/px · z∈[-69,+103]mm · 3 of 44 slices shown]
[im 1/44]
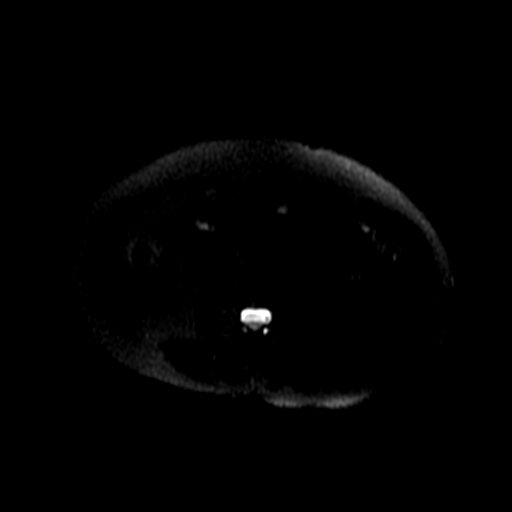
[im 22/44]
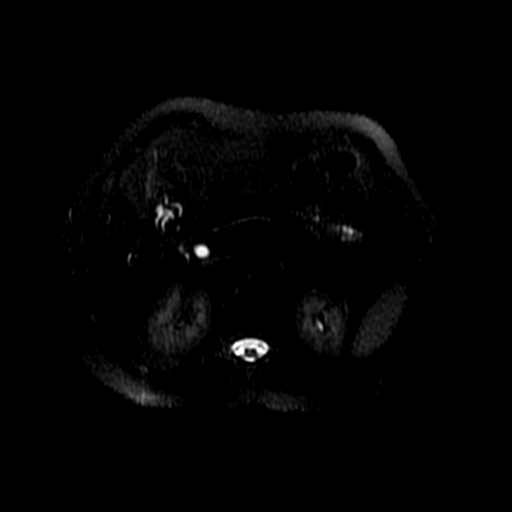
[im 44/44]
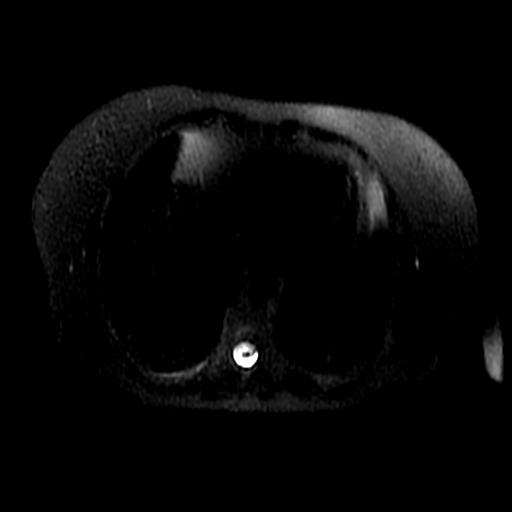

[Series 7: 3 plane loc · axial · 8.0mm · 0.78mm/px · 1 of 13 slices shown (2 of 2)]
[im 1/13]
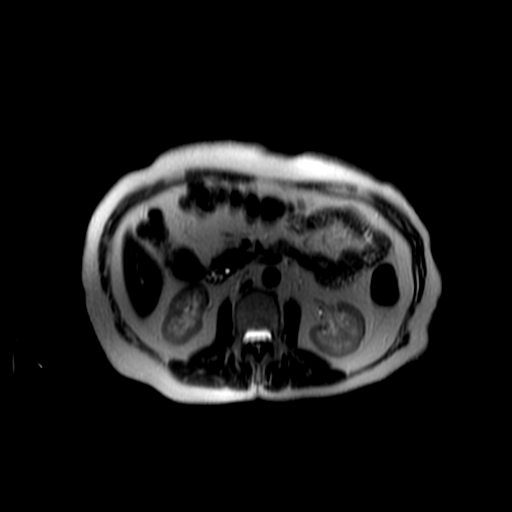

[Series 10: ax dualecho fspgr · axial · 5.0mm · 0.70mm/px · z∈[-88,+104]mm · 4 of 66 slices shown]
[im 1/66]
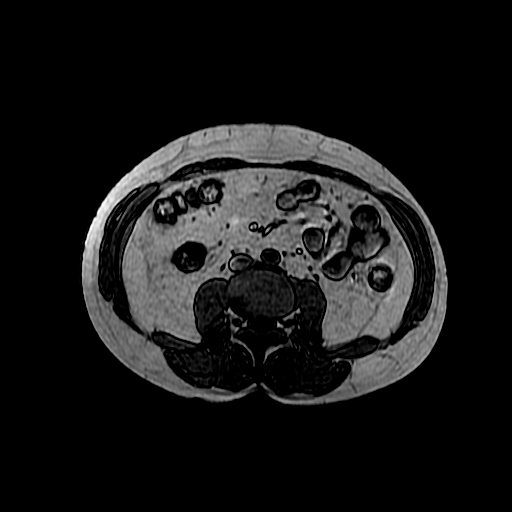
[im 22/66]
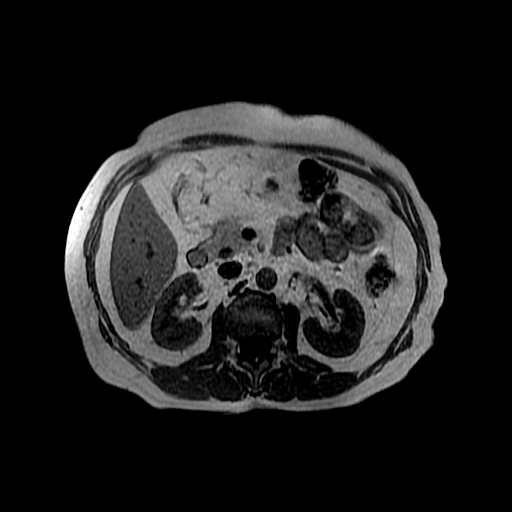
[im 44/66]
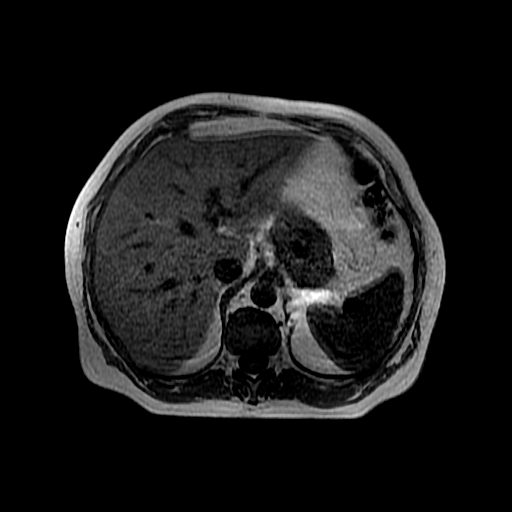
[im 66/66]
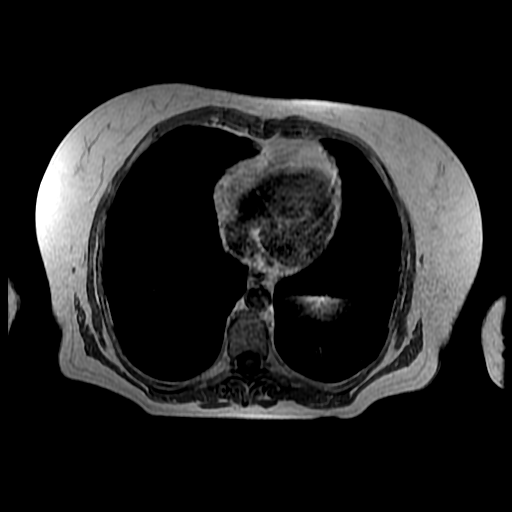

[Series 12: T2 · coronal · 6.0mm · 0.70mm/px · 2 of 28 slices shown (1 of 2)]
[im 1/28]
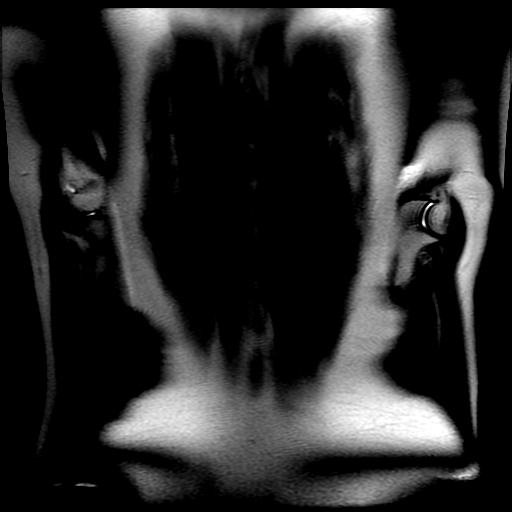
[im 28/28]
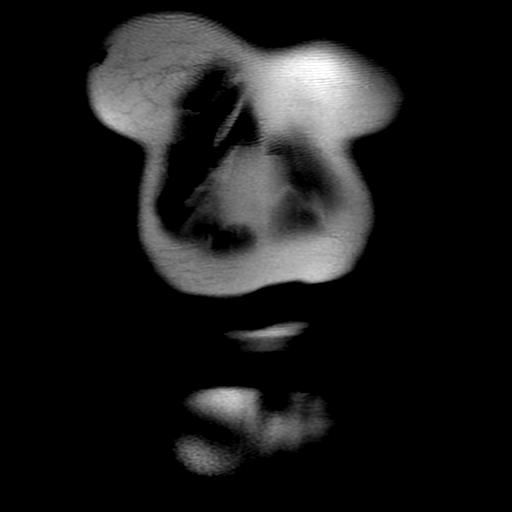

[Series 13: T2 · axial · 6.0mm · 0.70mm/px · z∈[-86,+103]mm · 2 of 28 slices shown (2 of 2)]
[im 1/28]
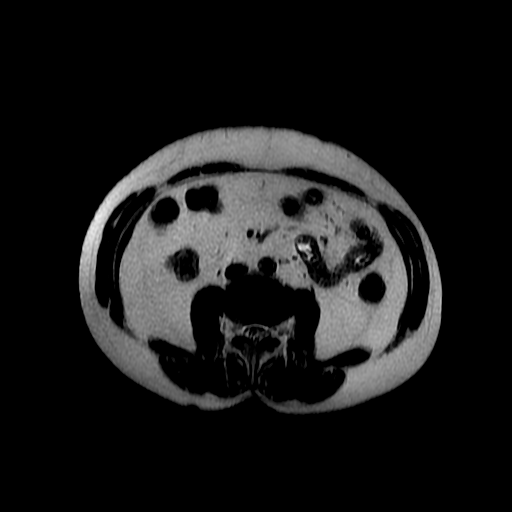
[im 28/28]
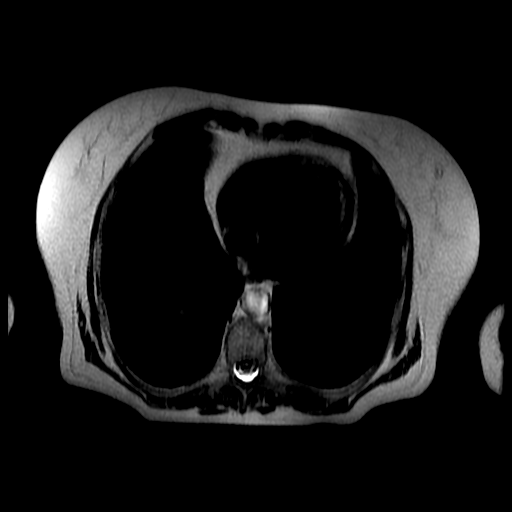

[Series 17: T2 fat-sat · coronal · 5.0mm · 0.70mm/px · 2 of 32 slices shown (2 of 2)]
[im 1/32]
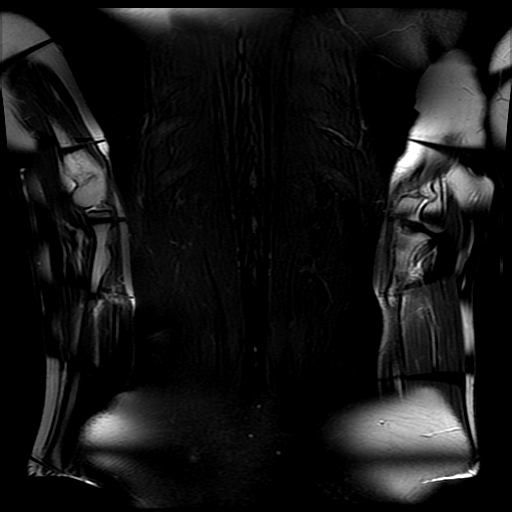
[im 32/32]
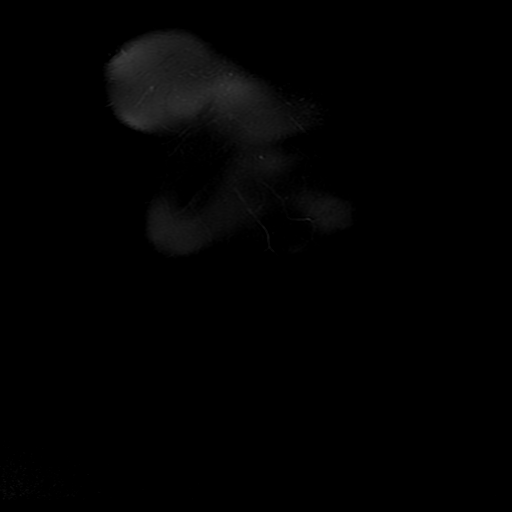

[Series 500: reformatted · coronal · 15.0mm · 0.55mm/px · 2 of 56 slices shown]
[im 1/56]
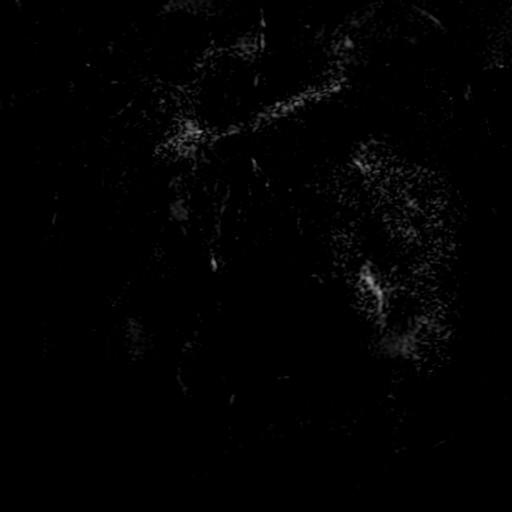
[im 28/56]
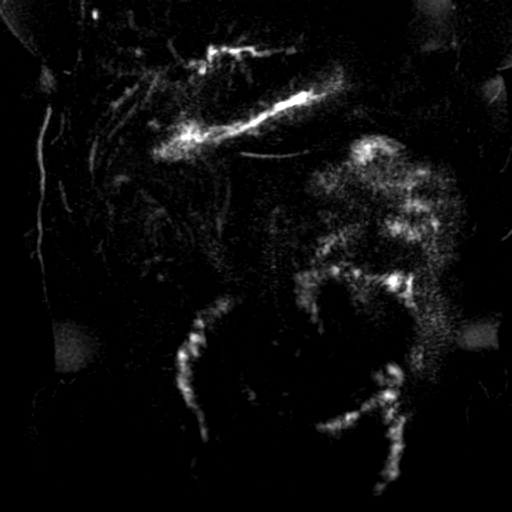

[21 of 48 positions shown; findings below may reference images not displayed]

FINDINGS: Within the left hepatic lobe there is a T2 hyperintense
structure measuring 10 mm.

This is stable from previous exams and likely represents a benign
cyst.

There are no new liver abnormalities noted.

The patient is status post cholecystectomy.

The common bile duct measures up to 9 mm.  This is not
significantly changed from 04/13/2006.

There is no evidence for choledocholithiasis or obstructing mass.
The common bile duct has a smooth taper up to the ampulla.

The pancreas is normal.

The adrenal glands are both normal.

The spleen is normal.

Both kidneys are unremarkable.

There is no free fluid within the upper abdomen.

The signal from within the bone marrow appears normal
IMPRESSION: 1.  Chronic dilatation of the common bile duct without evidence for
choledocholithiasis or mass.
2.  Stable probable cyst within the left hepatic lobe

## 2010-12-19 LAB — D-DIMER, QUANTITATIVE: D-Dimer, Quant: <0.22 ug/mL-FEU (ref 0.00–0.48)

## 2010-12-19 LAB — BASIC METABOLIC PANEL
BUN: 7 mg/dL (ref 6–23)
Chloride: 104 mEq/L (ref 96–112)
Creatinine, Ser: 0.75 mg/dL (ref 0.4–1.2)
GFR calc Af Amer: >60 mL/min (ref >60)
GFR calc non Af Amer: >60 mL/min (ref >60)
Potassium: 3.8 mEq/L (ref 3.5–5.1)

## 2010-12-19 LAB — COMPREHENSIVE METABOLIC PANEL
ALT: <8 U/L (ref 0–35)
Alkaline Phosphatase: 69 U/L (ref 39–117)
Chloride: 107 mEq/L (ref 96–112)
Glucose, Bld: 96 mg/dL (ref 70–99)
Potassium: 3.6 mEq/L (ref 3.5–5.1)
Sodium: 139 mEq/L (ref 135–145)
Total Bilirubin: 0.3 mg/dL (ref 0.3–1.2)
Total Protein: 5.4 g/dL — ABNORMAL LOW (ref 6.0–8.3)

## 2010-12-19 LAB — DIFFERENTIAL
Basophils Absolute: 0.1 10*3/uL (ref 0.0–0.1)
Basophils Relative: 1 % (ref 0–1)
Lymphocytes Relative: 48 % — ABNORMAL HIGH (ref 12–46)
Lymphocytes Relative: 57 % — ABNORMAL HIGH (ref 12–46)
Lymphs Abs: 4.1 10*3/uL — ABNORMAL HIGH (ref 0.7–4.0)
Monocytes Relative: 7 % (ref 3–12)
Neutro Abs: 2.4 10*3/uL (ref 1.7–7.7)
Neutrophils Relative %: 34 % — ABNORMAL LOW (ref 43–77)
Neutrophils Relative %: 43 % (ref 43–77)

## 2010-12-19 LAB — CARDIAC PANEL(CRET KIN+CKTOT+MB+TROPI)
CK, MB: 0.8 ng/mL (ref 0.3–4.0)
Relative Index: INVALID (ref 0.0–2.5)
Total CK: 35 U/L (ref 7–177)
Troponin I: 0.01 ng/mL (ref 0.00–0.06)

## 2010-12-19 LAB — CBC
Hemoglobin: 10.2 g/dL — ABNORMAL LOW (ref 12.0–15.0)
Platelets: 313 10*3/uL (ref 150–400)
RBC: 3.73 MIL/uL — ABNORMAL LOW (ref 3.87–5.11)
RBC: 4.03 MIL/uL (ref 3.87–5.11)
RDW: 15.2 % (ref 11.5–15.5)
WBC: 7 10*3/uL (ref 4.0–10.5)
WBC: 8.4 10*3/uL (ref 4.0–10.5)

## 2010-12-19 LAB — POCT CARDIAC MARKERS
CKMB, poc: <1 ng/mL — ABNORMAL LOW (ref 1.0–8.0)
Myoglobin, poc: 33.4 ng/mL (ref 12–200)
Troponin i, poc: <0.05 ng/mL (ref 0.00–0.09)

## 2010-12-19 LAB — MAGNESIUM: Magnesium: 1.9 mg/dL (ref 1.5–2.5)

## 2010-12-19 LAB — BRAIN NATRIURETIC PEPTIDE: Pro B Natriuretic peptide (BNP): 91 pg/mL (ref 0.0–100.0)

## 2010-12-21 LAB — BASIC METABOLIC PANEL
BUN: 5 mg/dL — ABNORMAL LOW (ref 6–23)
CO2: 25 mEq/L (ref 19–32)
Calcium: 9.8 mg/dL (ref 8.4–10.5)
Creatinine, Ser: 0.82 mg/dL (ref 0.4–1.2)
Glucose, Bld: 107 mg/dL — ABNORMAL HIGH (ref 70–99)

## 2010-12-21 LAB — DIFFERENTIAL
Basophils Absolute: 0.1 10*3/uL (ref 0.0–0.1)
Basophils Relative: 1 % (ref 0–1)
Monocytes Absolute: 0.5 10*3/uL (ref 0.1–1.0)
Neutro Abs: 4.1 10*3/uL (ref 1.7–7.7)
Neutrophils Relative %: 47 % (ref 43–77)

## 2010-12-21 LAB — CBC
Hemoglobin: 12 g/dL (ref 12.0–15.0)
MCHC: 33.9 g/dL (ref 30.0–36.0)
Platelets: 340 10*3/uL (ref 150–400)
RDW: 14.8 % (ref 11.5–15.5)

## 2010-12-23 LAB — URINALYSIS, ROUTINE W REFLEX MICROSCOPIC
Ketones, ur: NEGATIVE mg/dL
Nitrite: NEGATIVE
Protein, ur: NEGATIVE mg/dL
Urobilinogen, UA: 1 mg/dL (ref 0.0–1.0)

## 2010-12-23 LAB — BASIC METABOLIC PANEL
BUN: 7 mg/dL (ref 6–23)
GFR calc Af Amer: >60 mL/min (ref >60)
GFR calc non Af Amer: >60 mL/min (ref >60)
Potassium: 3.4 mEq/L — ABNORMAL LOW (ref 3.5–5.1)

## 2010-12-23 LAB — DIFFERENTIAL
Eosinophils Relative: 1 % (ref 0–5)
Lymphocytes Relative: 26 % (ref 12–46)
Lymphs Abs: 2 10*3/uL (ref 0.7–4.0)
Neutrophils Relative %: 67 % (ref 43–77)

## 2010-12-23 LAB — CBC
HCT: 29.4 % — ABNORMAL LOW (ref 36.0–46.0)
Platelets: 341 10*3/uL (ref 150–400)
WBC: 7.5 10*3/uL (ref 4.0–10.5)

## 2010-12-23 LAB — POCT CARDIAC MARKERS
Myoglobin, poc: 140 ng/mL (ref 12–200)
Troponin i, poc: 0.17 ng/mL — ABNORMAL HIGH (ref 0.00–0.09)

## 2010-12-24 LAB — BASIC METABOLIC PANEL
CO2: 25 mEq/L (ref 19–32)
GFR calc non Af Amer: >60 mL/min (ref >60)
Glucose, Bld: 104 mg/dL — ABNORMAL HIGH (ref 70–99)
Potassium: 4.2 mEq/L (ref 3.5–5.1)
Sodium: 138 mEq/L (ref 135–145)

## 2010-12-24 LAB — DIFFERENTIAL
Basophils Absolute: 0.1 10*3/uL (ref 0.0–0.1)
Eosinophils Relative: 0 % (ref 0–5)
Lymphocytes Relative: 15 % (ref 12–46)
Monocytes Absolute: 0.5 10*3/uL (ref 0.1–1.0)
Monocytes Relative: 4 % (ref 3–12)

## 2010-12-24 LAB — CBC
HCT: 32.3 % — ABNORMAL LOW (ref 36.0–46.0)
Hemoglobin: 10.5 g/dL — ABNORMAL LOW (ref 12.0–15.0)
MCHC: 32.5 g/dL (ref 30.0–36.0)
RDW: 18.5 % — ABNORMAL HIGH (ref 11.5–15.5)

## 2010-12-25 LAB — POCT I-STAT, CHEM 8
Calcium, Ion: 1.1 mmol/L — ABNORMAL LOW (ref 1.12–1.32)
HCT: 34 % — ABNORMAL LOW (ref 36.0–46.0)
Hemoglobin: 11.6 g/dL — ABNORMAL LOW (ref 12.0–15.0)
Sodium: 140 mEq/L (ref 135–145)
TCO2: 19 mmol/L (ref 0–100)

## 2010-12-25 LAB — CBC
HCT: 31.7 % — ABNORMAL LOW (ref 36.0–46.0)
MCHC: 32.3 g/dL (ref 30.0–36.0)
MCV: 78.8 fL (ref 78.0–100.0)
Platelets: 456 10*3/uL — ABNORMAL HIGH (ref 150–400)
RDW: 20.7 % — ABNORMAL HIGH (ref 11.5–15.5)

## 2010-12-25 LAB — POCT HEMOGLOBIN-HEMACUE: Hemoglobin: 9.4 g/dL — ABNORMAL LOW (ref 12.0–15.0)

## 2010-12-25 LAB — BASIC METABOLIC PANEL
BUN: 5 mg/dL — ABNORMAL LOW (ref 6–23)
Chloride: 104 mEq/L (ref 96–112)
Creatinine, Ser: 0.88 mg/dL (ref 0.4–1.2)
Glucose, Bld: 109 mg/dL — ABNORMAL HIGH (ref 70–99)

## 2010-12-25 LAB — DIFFERENTIAL
Basophils Absolute: 0 10*3/uL (ref 0.0–0.1)
Basophils Relative: 0 % (ref 0–1)
Eosinophils Absolute: 0 10*3/uL (ref 0.0–0.7)
Eosinophils Relative: 0 % (ref 0–5)
Lymphocytes Relative: 8 % — ABNORMAL LOW (ref 12–46)
Monocytes Absolute: 0.3 10*3/uL (ref 0.1–1.0)

## 2011-01-29 NOTE — Op Note (Signed)
Lori [name], Lori [name]               ACCOUNT NO.:  [id number]   MEDICAL RECORD NO.:  [id number]          PATIENT TYPE:  AMB   LOCATION:  DSC                          FACILITY:  [facility]   PHYSICIAN:  Tennis Must Meyerdierks, M.D.DATE OF BIRTH:  1955-11-05   DATE OF PROCEDURE:  02/10/2009  DATE OF DISCHARGE:                               OPERATIVE REPORT   PREOPERATIVE DIAGNOSIS:  Distal radius fracture, left wrist.   POSTOPERATIVE DIAGNOSIS:  Distal radius fracture, left wrist.   PROCEDURE:  Open reduction and internal fixation, left distal radius  fracture.   SURGEON:  Lowell Bouton, MD   ANESTHESIA:  Axillary block augmented by general.   OPERATIVE FINDINGS:  The patient had a comminuted fracture through the  distal radius that did not appear to enter the articular surface.   PROCEDURE IN DETAIL:  Under general anesthesia with a tourniquet on the  left arm, the left hand was prepped and draped in usual fashion.  After  exsanguinating the limb, the tourniquet was inflated to 250 mmHg.  The  index and long fingers were placed in finger traps and 10 pounds of  traction was applied across the end of the arm board.  A longitudinal  incision was made overlying the FCR tendon and carried down through the  subcutaneous tissues.  The bleeding points were coagulated.  Dissection  was carried through the FCR tendon sheath, and the tendon was retracted  ulnarly.  The floor of the sheath was incised with a scissors and blunt  dissection was carried down to the pronator quadratus.  It was then  released off the radius sharply.  A Freer elevator was used to elevate  the pronator quadratus, and the fracture site was identified.  The  fracture was reduced and a small DVR plate was applied.  X-rays showed  good position and so, a 3.5-mm screw was placed in the slotted hole.  The traction was then released and the 6 pegs were inserted distally  using both threaded and smooth pegs.  X-ray  showed good alignment and so  the remaining 2 screws were placed proximally using 3.5-mm screws.  There did not appear to be any penetration of the articular surface by  the screws.  The wound was irrigated with saline.  The pronator  quadratus was repaired with 4-0 Vicryl.  The subcutaneous tissue was  closed with 4-0 Vicryl, the skin with a 3-0 subcuticular Prolene.  Steri-  Strips were applied followed by sterile dressings and a volar wrist  splint.  The patient tolerated the procedure well and went to the  recovery room awake in stable and good condition.      Lowell Bouton, M.D.  Electronically Signed     EMM/MEDQ  D:  02/10/2009  T:  02/11/2009  Job:  [id number]

## 2011-01-29 NOTE — Op Note (Signed)
NAMEJANNIS, Lori [name]               ACCOUNT NO.:  [id number]   MEDICAL RECORD NO.:  [id number]          PATIENT TYPE:  AMB   LOCATION:  DSC                          FACILITY:  [facility]   PHYSICIAN:  Suzanna Obey, M.D.       DATE OF BIRTH:  22-Mar-1956   DATE OF PROCEDURE:  07/07/2008  DATE OF DISCHARGE:                               OPERATIVE REPORT   PREOPERATIVE DIAGNOSIS:  Left foreign body of the ear canal.   POSTOPERATIVE DIAGNOSIS:  Left foreign body of the ear canal.   SURGICAL PROCEDURE:  Removal of foreign body.   ANESTHESIA:  General.   ESTIMATED BLOOD LOSS:  Less than 1 mL.   INDICATIONS:  This is a 55 year old who has had a previous tympanoplasty  of the left ear and now has what is probably some dried old packing and  debris in the very lateral aspect of the ear canal.  She has been on  drops.  She would not allow me to remove this in the office.  She  insisted on operating room be in a sleep to remove it, as she does not  like anyone manipulating her ear.  She was informed of the risks and  benefits of the procedure and options were discussed.  All questions  were answered and consent was obtained.   OPERATION:  The patient was taken to the operating room and placed in  supine position after general mask ventilation anesthesia.  The left ear  was examined under otomicroscope direction.  There was a large black  hard crusted debris, it was in the lateral aspect of the canal and  posterior aspect that was removed with alligator forceps without much  difficulty.  The skin underneath this was irritated, but no significant  lesions or ulceration.  The tympanic membrane has a pinhole perforation  in the anterior inferior quadrant, but no inflammation of the tympanic  membrane.  She was then awakened and brought to the recovery room in  stable condition.  Counts were correct.           ______________________________  Suzanna Obey, M.D.     JB/MEDQ  D:  07/07/2008  T:   07/07/2008  Job:  [id number]

## 2011-01-29 NOTE — Consult Note (Signed)
Lori [name], Lori [name] NO.:  [id number]   MEDICAL RECORD NO.:  [id number]          PATIENT TYPE:  EMS   LOCATION:  MAJO                         FACILITY:  [facility]   PHYSICIAN:  Rollene Rotunda, MD, FACCDATE OF BIRTH:  1956/02/15   DATE OF CONSULTATION:  DATE OF DISCHARGE:                                 CONSULTATION   PRIMARY CARDIOLOGIST:  (New) Rollene Rotunda, MD, [facility]   PRIMARY CARE PHYSICIAN:  Dr. Cathlean Sauer in [facility].   CHIEF COMPLAINTS:  Chest pain.   HISTORY OF PRESENT ILLNESS:  Lori [name] is a 55 year old Caucasian  female with no known history of coronary artery disease but risk factors  including hypertension, hyperlipidemia, ongoing 40 plus-pack-year  tobacco abuse disorder, positive family history, and sedentary lifestyle  presenting with chest pain in the setting of recent fall on March 27, 2009, resulting in proximal right ulna/olecranon fracture and radial  neck fracture as well as right second through fifth metatarsal  fractures.  The patient reports last night eating a late snack of pinto  beans and meat love a little before 1:00 a.m. and approximately 40  minutes to 1 hour later, she began experiencing burning central chest  pain typical of her former GERD symptoms.  However, she also began to  experience right-sided aching pain and chest tightness across her chest  which was new.  The patient denies any radiation or associated symptoms.  She became concerned and called EMS.  They arrived between 2:15-2:30  a.m. and gave sublingual nitroglycerin and 4 baby aspirin.  She was  eventually taken to [facility] ED for further eval.  Upon arrival to  emergency department, she was asymptomatic with a heart rate of 98 and  BP 98/69.  Chest x-ray showed no active disease, and EKG was without  acute changes.  Due to the patient's recent trauma and open reduction  and internal fixation of her fracture as described above, the patient  was ruled out  for PE with a CT angio.  The patient was subsequently  found to have minimally elevated troponins on point-of-care markers of  0.17.  At the time of eval by Cardiology, the patient remains  asymptomatic.   PAST MEDICAL HISTORY:  1. Hypertension.  2. Hyperlipidemia.  3. Ongoing 40 plus-pack-year tobacco abuse disorder.  4. Gastroesophageal reflux disease.  5. COPD.  6. S/P recent fall on March 27, 2009, with right proximal      ulna/olecranon fractures and right radial neck fracture.  Also      right second through fifth metatarsal fractures.   SURGICAL HISTORY:  1. Cholecystectomy.  2. Total abdominal hysterectomy and bilateral oophorectomy.   SOCIAL HISTORY:  The patient lives in [place] with her husband.  She  is disabled.  She has a 40 plus-pack-year ongoing tobacco abuse  disorder.  Currently smoking 1-3 cigarettes a day.  Denies EtOH, illicit  drug use, or herbal medications other than calcium supplements.  She has  a regular diet and does no regular exercise.   FAMILY HISTORY:  Mother living, age 68 with no history of CAD.  Father  deceased at age 58 from emphysema.  The patient has 1 brother who  developed coronary artery disease in his late 61s and has had coronary  artery bypass grafting in his early 43s.   REVIEW OF SYSTEMS:  Please see HPI.  All other systems reviewed and were  negative.   CODE STATUS:  Full.   ALLERGIES AND INTOLERANCES:  1. CODEINE.  2. DARVOCET.  3. VICODIN.  4. OXYCODONE.  5. TETANUS TOXOID.   HOME MEDICATIONS:  1. Singulair 10 mg p.o. daily.  2. Valtrex 1 g p.o. b.i.d.  3. Dexilant 60 mg p.o. daily.  4. Hydrochlorothiazide 25 mg p.o. daily.  5. TriLipix 135 mg p.o. daily.  6. Albuterol inhaler p.r.n.   EMERGENCY DEPARTMENT:  A 300 mg of Plavix times 1 p.o., IV heparin per  Pharmacy, 1-inch nitroglycerin then switch to IV nitroglycerin 4 times,  aspirin 81 mg.   PHYSICAL EXAMINATION:  VITAL SIGNS:  Temperature 97.3 degrees   Fahrenheit, BP 98/69 on admission up to 117/62 currently, pulse 98 on  admission up to 88 currently, respiration rate 18, O2 saturation 98% on  2 liters by nasal cannula.  GENERAL:  The patient is alert and oriented x3 in no apparent distress.  She is able to speak easily without any respiratory distress.  HEENT:  Her head is normocephalic, atraumatic.  Pupils equal, round, and  reactive to light.  Extraocular muscles are intact.  Nares are patent  without discharge.  Dentition is poor.  Oropharynx is without erythema  or exudate.  NECK:  Supple without lymphadenopathy, no thyromegaly, no JVD, and no  bruits.  HEART:  Regular with audible S1 and S2.  No clicks, rubs, murmurs, or  gallops.  Pulses are 2+ and equal in both upper and lower extremities.  LUNGS:  Clear to auscultation bilaterally.  SKIN:  No rashes, lesions, or petechiae.  ABDOMEN:  Soft, nontender, nondistended.  Normal abdominal bowel sounds.  No hepatosplenomegaly.  EXTREMITIES:  No clubbing, cyanosis, or edema.  MUSCULOSKELETAL:  No joint deformity or effusions.  CHEST:  Nontender.  No spinal or CVA tenderness.  EXTREMITIES:  Note, the patient has a splint with Ace wrap on right  upper extremity from under the shoulder all the way to proximal  interphalangeal joints.  Also note, a right lower extremity cast from  the knee down.   RADIOLOGY:  1. CT angio of the chest is negative for pulmonary embolism.  2. Chest x-ray showing no active disease.  3. EKG, normal sinus rhythm at a rate of 81.  No significant ST-T wave      changes.  No significant Q-waves, normal axis, no evidence of      hypertrophy.  Intervals within normal limits.  No significant      change from prior tracing completed March 28, 2009.   LABORATORY DATA:  WBCs 7.5, HGB 9.9, HCT 29.4, PLT count 341.  Sodium  139, potassium 3.4, chloride 108, CO2 of 22, BUN 7, creatinine 0.83,  glucose 113.  Point-of-care was positive with troponin of 0.17.  Urinalysis  was within normal limits.   ASSESSMENT/PLAN:  The patient has been seen and evaluated by Dr.  Antoine Poche who notes that the patient reports 8/10 in severity chest pain  that was sharp and positive for diaphoresis without radiation or other  associated symptoms.  As noted above, the patient has multiple risk  factors and the pretest probability of obstructive coronary artery  disease is high.  Dr.  Hochrein and Lenard Galloway discussed the  recommendation that the patient should be admitted to the hospital and  likely would need a cardiac catheterization.  The patient indicated that  she was unwilling to stay in the hospital any longer.  The risks of  leaving including severe myocardial infarction, which may result in  death were described in detail with the patient.  The patient indicated  that she understood.  The patient's husband also indicated that he  understood.  The patient was also informed that she would have to sign  out against the medical advice.  The patient indicated she also  understood this.  In that case, our recommendation if she refuses to  stay on full-strength enteric-coated aspirin, sublingual nitroglycerin  and low-dose beta-blocker to be added to her home medications.      Jarrett Ables, [facility]      Rollene Rotunda, MD, [facility]  Electronically Signed    MS/MEDQ  D:  03/31/2009  T:  04/01/2009  Job:  [id number]

## 2011-01-29 NOTE — Op Note (Signed)
Lori [name], DIGILIO               ACCOUNT NO.:  [id number]   MEDICAL RECORD NO.:  [id number]          PATIENT TYPE:  INP   LOCATION:  1621                         [facility]ACILITY:  [facility]   PHYSICIAN:  Mark C. Ophelia Charter, M.D.    DATE OF BIRTH:  1956/05/11   DATE OF PROCEDURE:  03/28/2009  DATE OF DISCHARGE:                               OPERATIVE REPORT   POSTOPERATIVE DIAGNOSIS:  Fall with right proximal ulna olecranon  fracture and radial neck fracture, angulated, closed right foot second  through fifth metatarsal fractures.   PROCEDURE:  Open reduction internal fixation right olecranon fracture.  Closed reduction right radial neck fracture and application of short-leg  fiberglass cast for nondisplaced second through fifth right metatarsal  fractures.   SURGEON:  Annell Greening, MD   ANESTHESIA:  General plus local.   TOURNIQUET TIME:  Less than an hour.   This 55 year old female was walking and got her foot caught, tripped  suffering metatarsal fractures nondisplaced as well as fracture of the  proximal forearm with radial neck fracture and angulated proximal ulnar  shaft fracture which was extra-articular.  The patient was  neurologically intact.  The forearm was soft.   PROCEDURE:  After induction of anesthesia, proximal arm tourniquet  application with an arm board and standard prepping with DuraPrep to the  fingertips was performed.  The usual extremity sheets and drapes were  applied.  Sterile skin marker was used and a surgical time-out procedure  was completed.  Outline was made for both the radial neck  incision in  case ORIF of the radial neck was necessary as well as the forearm.  After time-out was completed and Ancef had been given,  arm was wrapped  with an Esmarch and tourniquet was inflated.  Incision was made over the  subcutaneous border of the ulna cheating the incision slightly toward  the medial aspect in case the radial incision  had to be made with  adequate an skin  bridge.  After this was performed, subcutaneous  exposure and periosteal stripping at the fracture was performed directly  over the subcutaneous border of the ulna.  There was a small butterfly  fragment and fracture was reduced, held with a K-wire.  A DePuy locking  anatomic plate was selected and placed.  The patient had old apparent  fracture of the forearm with thickening of the cortices and the  subcutaneous border of the ulna was not exactly straight.  The plate was  applied and side arms were bent down to correspond to the ulna and  screws were drilled; a combination of locking and nonlocking screws were  placed.  The most proximal planned screw for home run was not necessary  since this was not an intra-articular fracture and it was bent over and  as it was being bent to the extreme, a small crack occurred as screw was  inserted and actually slipped down inside the plate hole and was  countersunk.  Screw was backed up until it was even with the plate and  was catching against the plate.  The edge of the  plate was snapped off.  The plate was buried in cortical bone and was left in position.  The  head of it was pushing against the plate holding it securely and the  screw could not back out.  All the screws had been tightened down with  the torque wrench.  The entire apparatus was checked under fluoroscopy.  Spot photos taken.  No screws were in the joint and all bicortical  screws were good length.  __________ fluoro was used for visualization  since a regular C-arm was not available and was being used on a femur  fracture.  After irrigation with saline solution, tourniquet was  deflated, subcutaneous tissue closed with 2-0 Vicryl and skin staple  closure of the skin.  Postop dressing,  short -arm cast material was  then brought into the room.  A long-arm splint  was applied to the right upper extremity and then a fiberglass cast was  applied to right lower extremity for the  nondisplaced metatarsal  fractures.  Instrument count and needle count were correct.  The patient  tolerated the procedure well and was transferred to recovery room in  stable condition.      Mark C. Ophelia Charter, M.D.  Electronically Signed     MCY/MEDQ  D:  03/28/2009  T:  03/28/2009  Job:  [id number]

## 2011-01-29 NOTE — Assessment & Plan Note (Signed)
[facility]                         GASTROENTEROLOGY OFFICE NOTE   [name], [name]                      MRN:          [id number]  DATE:12/08/2007                            DOB:          [birth year]    [name] is an extremely difficult patient, as per my note of December 03, 2005.  She has multiple functional GI complaints.  She has had  negative GI workup for years.  She is status post multiple surgical  procedures including cholecystectomy, total abdominal hysterectomy, and  bilateral oophorectomy.  She now complains of four weeks of severe  exquisite right lower quadrant pain without changing bowel habits,  nausea, vomiting, fever, chills or any hepatobiliary complaints.  She  continued to eat and has had no significant weight loss.  In fact her  weight today is up 11 pounds from March of 2007.  She denies melena or  hematochezia, or other systemic complaints.   She is awake, alert and in no acute distress.  She weighs 123 pounds and  is afebrile and blood pressure is 130/90 and pulse was 80 and regular.  Her abdomen was slightly distended, but there was no organomegaly,  masses, and bowel sounds were normal.  She was exquisitely tender all  over her abdomen with what appeared to be a possible positive  stethoscope sign.  RECTAL EXAM:  Deferred.  Bowel sounds were present but somewhat  hypoactive.   ASSESSMENT:  With [name]'s past history, it is hard to know if she  has an acute concrete gastrointestinal problem versus a functional  disorder.  The symptoms certainly do not sound like small bowel  obstruction or any other inflammatory process.  As part of our previous  workup, I even checked upper GI and small bowel series in March of 2007.  This was entirely normal.  It was felt at that time that she possibly  had bacteria overgrowth syndrome and was treated with Xifaxan 400 mg  t.i.d. for 10 days along with probiotic therapy.  Stool  hemoccult cards  were also sent in and were all guaiac negative.   RECOMMENDATIONS:  1. Check screening laboratory parameters.  2. Stat CT scan of the abdomen and pelvis.  3. Ultram 50 mg every 4 to 6 hours as needed for pain until workup can      be completed.  4. Consider surgical referral, interim clinical workup and course.   The patient had a normal colonoscopy in February of 2006.     Lori Rea. Jarold Motto, MD, [facility], FAGA  Electronically Signed    DRP/MedQ  DD: 12/08/2007  DT: 12/08/2007  Job #: [id number]

## 2011-02-01 NOTE — Op Note (Signed)
NAME:  Lori [name], Lori [name]                         ACCOUNT NO.:  [id number]   MEDICAL RECORD NO.:  [id number]                   PATIENT TYPE:  AMB   LOCATION:  DSC                                  FACILITY:  [facility]   PHYSICIAN:  Mark C. Ophelia Charter, M.D.                 DATE OF BIRTH:  [birth year]   DATE OF PROCEDURE:  01/28/2003  DATE OF DISCHARGE:                                 OPERATIVE REPORT   PREOPERATIVE DIAGNOSIS:  Right shoulder impingement with supraspinatus and  subscapularis tendinopathy.   POSTOPERATIVE DIAGNOSIS:  Right shoulder superior-anterior labral tear,  subacromial impingement with partial rotator cuff tear.   PROCEDURE:  Diagnostic and operative arthroscopy, right shoulder.  Debridement of labral superior flap tear.  Subacromial arthroscopic  decompression with acromioplasty.   SURGEON:  Mark C. Ophelia Charter, M.D.   ASSISTANT:  Genene Churn. Barry Dienes, P.A.-C.   ANESTHESIA:  General.   PROCEDURE:  After induction of general anesthesia with prep with Ancef, the  patient was placed in a beach chair position after intubation with careful  positioning.  Duraprep was used and the usual arthroscopic procedure drapes,  appropriate stockinette, and Coban were applied.  The scope was placed from  a posterior approach after a patch was applied.  The skin was anesthetized  with local and the joint was infiltrated with saline solution.  Inspection  of the joint showed a tear of the superior-anterior labrum.  The inferior  middle and superior glenohumeral ligaments were intact.  There was a  negative drive-through sign.  No tearing of the posterior labrum was  present.  The biceps anchor was slightly hypermobile but was still firmly  attached.  The labrum was subluxed partially over the edge of the articular  surface but was still anchored.  The biceps tendon showed some mild  hypermobility with extension of the elbow.  The tendon would tighten up, and  there was no partial tearing of the  biceps tendon.  The undersurface of the  rotator cuff was inspected and was intact.  There was no evidence of full-  thickness tear.  Through the anterior portal, a 4.2 Cuda shaver was  introduced, and the length of the tear was smoothed down and debrided, and  then further probing showed that there was no remaining subluxing labral  fragment.  The axillary recess was normal.  The scope was then placed in the  subacromial space, and an anterolateral portal was made in the subacromial  space, and the Cuda shaver was introduced, followed by the water Bovie.  The  edges of the acromion were coagulated, and then using the anterolateral  portal, initially the Cuda shaver was used to remove the periosteum off of  the acromion, followed by a 6 mm bone bur.  The portals were switched for  visualization to make sure there was smooth, even resection.  The bursa was  resected with the Cuda  shaver, and inspection of the rotator cuff showed  that there was no defect present.  There was some mild fraying.  About one-  third to one-half of the anterior acromion was resected.  The distal aspect  of the clavicle was not prominent, and after palpation and inspection,  switching portals, an acromioplasty was completed, and the resected area was  smooth.  The scope was removed.  The shoulder was  suctioned dry.  Nylon sutures were placed in the portals, and 30 mL of  Marcaine was injected for postoperative analgesia.  A postop dressing and  sling were applied.  The patient tolerated the procedure well.  The  patient's surgery was the appropriate treatment of this condition.  I will  follow up in one week.                                               Mark C. Ophelia Charter, M.D.    MCY/MEDQ  D:  01/28/2003  T:  01/29/2003  Job:  [id number]

## 2011-02-01 NOTE — Op Note (Signed)
   NAME:  Lori [name], Lori [name]                         ACCOUNT NO.:  [id number]   MEDICAL RECORD NO.:  [id number]                   PATIENT TYPE:  AMB   LOCATION:  ENDO                                 FACILITY:  [facility]   PHYSICIAN:  Georgiana Spinner, M.D.                 DATE OF BIRTH:  1955-11-14   DATE OF PROCEDURE:  11/22/2002  DATE OF DISCHARGE:                                 OPERATIVE REPORT   PROCEDURE:  Upper endoscopy with biopsy.   INDICATIONS:  Abdominal pain.   ANESTHESIA:  Demerol 80, Versed 7 mg.   DESCRIPTION OF PROCEDURE:  With patient mildly sedated in the left lateral  decubitus position, the Olympus videoscopic endoscope was inserted in the  mouth, passed under direct vision through the esophagus, which appeared  normal, other than some distal esophageal redness which was photographed and  biopsied.  We entered into the stomach.  Fundus, body, antrum appeared  normal.  Duodenal bulb probably showed some erythema, and this was  photographed.  Second portion of duodenum appeared normal.  From this point,  the endoscope was slowly withdrawn, taking circumferential views of the  duodenal mucosa, until the endoscope then pulled back into the stomach,  placed in retroflexion to view the stomach from below, and an incomplete  wrap of the GE junction around the endoscope was seen, and free reflux was  noted during the procedure.  The endoscope was then straightened.  A CLO  biopsy was taken for Helicobacter from the antrum, and the endoscope was  withdrawn, taking circumferential views of the remaining gastric and  esophageal mucosa.  The patient's vital signs and pulse oximeter remained  stable.  The patient tolerated the procedure well without apparent  complications.   FINDINGS:  Erythema of distal esophagus above a loose wrap of the  gastroesophageal junction around the endoscope.  Additionally, erythema of  duodenal bulb.   PLAN:  1. Await biopsy reports.  2. The  patient will call me for results and follow up with me as an     outpatient.                                               Georgiana Spinner, M.D.    GMO/MEDQ  D:  11/22/2002  T:  11/22/2002  Job:  [id number]

## 2011-02-01 NOTE — Op Note (Signed)
[place]  Patient:    Lori [name], Lori [name]                      MRN: [id number] Proc. Date: 01/28/00 Adm. Date:  [id number] Attending:  Jacki Cones                           Operative Report  PREOPERATIVE DIAGNOSIS:  Cervical spondylosis C5-6, C6-7 with foraminal stenosis.  POSTOPERATIVE DIAGNOSIS:  Cervical spondylosis C5-6, C6-7 with foraminal stenosis.  PROCEDURE:  C5-6, C6-7 anterior cervical diskectomy and fusion with left iliac crest bone graft.  SURGEON:  Mark C. Ophelia Charter, M.D.  ASSISTANT:  Cammy Copa, M.D.  ANESTHESIA:  GOT.  ESTIMATED BLOOD LOSS:  200 ml.  DRAINS:  One Hemovac - neck.  OPERATIVE PROCEDURE:  After preoperative Ancef prophylaxis with the arms tucked to the side, careful padding over the ulnar nerve, 2 pound sandbag behind the neck and IV bag underneath the left iliac crest.  The usual extremity careful tucking was performed.  The neck was prepped with Duraprep, scored with towels.  Sterile skin mark over the neck,  Betadine Vi-drape, sterile Mayo stand at the head, split sheet in the middle and thyroid sheet was applied.  Incision was made at the midline extending to the left overlying C6.  Platysma was elevated, dissection between the sternocleidomastoid lateral with carotid sheath and contents lateral and esophagus, trachea towards the midline.  Longus colli were visualized and a needle was placed at the presumed C5-6 level, which turned out to be C6-7.  C5-6 was initially operatively addressed, since it was the level with the most severe foraminal stenosis. Offset drill guide was used to center the dowel hole slightly superior to allow enough vertebral body at C6 with the two level adjacent fusion. Sequential reaming back to the posterior cortex was performed when the cortex was 1 mm thick.  Operative microscope was draped and brought in.  Posterior longitudinal ligament was taken down under direct  visualization and cord decompressed.  Disk fragments were actually contained by the ligament with foraminal stenosis and 1-2 mm rongeur was used to remove the spurs in the gutter.  After irrigation, Carlens curets were used to sequentially to strip out the gutters, leaving subchondral bone with no remaining articular cartilage at the uncinate process at the joint of Luschka.  Repeat irrigation was performed. Left iliac crest had been exposed while the x-ray was developing and a 14 mm plug was then taken after the operative decompression was finished.  A 14 mm plug was taken and her iliac crest actually was thinned leaving an 8 mm plug with 14 mm hole.  This was impacted flush with the anterior cortex and did not advance significantly from that level.  It was extremely tight, attempt was made to back it up some, but it would not back up and it was felt since there was an extremely tight fit with good fixation, that flush with the anterior cortex was sufficient and no countersinking was performed.  Gutters were loose for egress of any fluids.  Identical procedure was repeated at the C6-7 level.  At this level, there was again, some disk material, but it was all retained by the ligament.  Ligament was taken down and there was no free fragment and the foramina were enlarged with the Kerrison rongeurs.  Using the operative microscope to decompress the foramina.  After irrigation,  14 mm plug was taken.  This has a thick plug and was trimmed down about 1 mm and impacted into place with secure fit.  After irrigation, Hemovac was placed through separate stab incision.  A 3-0 Vicryl in the platysma and 4-0 Vicryl subcuticular skin closure was then performed.  Next, iliac crest was closed with 0 Vicryl in the fascia, 2-0 Vicryl in subcutaneous tissue and a 4-0 Vicryl subcuticular skin closure.  Tincture of benzoin, Steri-Strips, and a soft collar was applied to the neck.  The patient tolerated the  procedure well, was neurological intact in the recovery room. Instrument count and needle count was correct. DD:  01/28/00 TD:  01/29/00 Job: [id number]

## 2011-02-01 NOTE — Op Note (Signed)
NAME:  Lori [name], Lori [name]                         ACCOUNT NO.:  [id number]   MEDICAL RECORD NO.:  [id number]                   PATIENT TYPE:  OIB   LOCATION:  5005                                 FACILITY:  [facility]   PHYSICIAN:  Mark C. Ophelia Charter, M.D.                 DATE OF BIRTH:  08/22/1956   DATE OF PROCEDURE:  06/06/2003  DATE OF DISCHARGE:                                 OPERATIVE REPORT   PREOPERATIVE DIAGNOSIS:  Cervical spondylosis, C3-4, C4-5.   POSTOPERATIVE DIAGNOSIS:  Cervical spondylosis, C3-4, C4-5.   OPERATION PERFORMED:  C3-4, C4-5 anterior cervical diskectomy and fusion,  right iliac crest bone graft and anterior plating.   SURGEON:  Mark C. Ophelia Charter, M.D.   ASSISTANT:  Genene Churn. Denton Meek.   ANESTHESIA:  GOT.   ESTIMATED BLOOD LOSS:  .   INDICATIONS FOR PROCEDURE:  This 55 year old female had a previous 5-6, C6-7  fusion which healed uneventfully.  She has developed adjacent segment  disease at the next two levels up with spondylosis and compression by MRI.   DESCRIPTION OF PROCEDURE:  After induction of general anesthesia,  preoperative Ancef prophylaxis, DuraPrep, area squared with towels on her  neck and right iliac crest for bone graft.  Previous bone grafts were taken  from her left crest.  The right crest was selected for harvesting for this  procedure.  Sterile skin marker was used in the neck, Betadine Vi-drape  applied.  Sterile Mayo stand at the head.  Laminectomy sheets, drapes, and C-  arm was brought in and placed in reversed rainbow position for a lateral C-  spine visualization.  Incision was made, platysma was divided.  Blunt  dissection between the carotid sheath, the contents lateral and midline  structures.  The longus colli was visualized, large anterior spurs at 4-5  were visualized, C-arm was used to confirm the appropriate level.  Diskectomy was performed.  Gutters were stripped with Cloward curets.  A 10  mm hole was drilled with  Cloward drill back to the posterior cortex.  Operating microscope was draped and brought in and the posterior  longitudinal ligament was taken down.  There was some disk extruded on the  right side with spurring and there was hypermobility with some overhanging  of end plates of 4 over 5.  There were some extruded pieces of disk out the  right side.  This was removed and the cord was visualized with dural  decompression.  After irrigation with saline solution, a plug was harvested  from the right anterior superior iliac spine starting 3 to 4 cm posterior to  the spine.  Bicortical graft was trimmed, impacted, based on depth gauge  measurements, countersunk slightly.  Identical procedure repeated at the 3-4  level.  This level there was spurring which was less severe.  There was some  bulging of the disk without extruded fragments.  Second plug  was taken and  placed.  Synthes 34 mm plate was selected, checked under fluoroscopy.  14 mm  screws were placed at the four corners and the set screws were placed  locking the screws in.  After irrigation with saline solution, the Hemovac  was placed through a separate stab incision in line  with the skin.  This was performed with __________ technique.  Platysma  closed with 3-0 and 4-0 Vicryl subcuticular closure.  Tincture benzoin and  Steri-Strips, Marcaine infiltration.  Postoperative dressing and soft  cervical collar was applied.  Instrument count and needle count was correct.                                                Mark C. Ophelia Charter, M.D.    MCY/MEDQ  D:  06/06/2003  T:  06/07/2003  Job:  [id number]

## 2011-02-01 NOTE — Procedures (Signed)
[facility]  Patient:    Lori [name], Lori [name] Visit Number: [id number] MRN: [id number]          Service Type: END Location: ENDO Attending Physician:  Louie Bun Dictated by:   Everardo All Madilyn Fireman, M.D. Proc. Date: 07/28/01 Admit Date:  07/28/2001                             Procedure Report  PROCEDURE:  Colonoscopy.  SURGEON:  John C. Madilyn Fireman, M.D.  INDICATIONS FOR PROCEDURE:  Prolonged complaints of abdominal pain, cramps, diarrhea, and bloating with negative workup to date.  Failure to respond to empiric medical trials.  DESCRIPTION OF PROCEDURE:  The patient was placed in the left lateral decubitus position, and placed on the pulse monitor with continuous low flow oxygen delivered by nasal cannula.  She was sedated with 75 mg of IV Demerol and 6 mg of IV Versed.  The Olympus video colonoscope was inserted into the rectum and advanced to the cecum, confirmed by transillumination at McBurneys point, and visualization of the ileocecal valve, and visualization of the appendiceal orifice, as well as intubation of the terminal ileum.  The prep was excellent.  The terminal ileum, cecum, ascending, transverse, descending, and sigmoid colon all appeared normal with no masses, polyps, diverticula, or other mucosal abnormalities.  The rectum likewise appeared normal.  A retroflexed view of the anus revealed no obvious internal hemorrhoids. Biopsies were taken to rule out collagenous or microscopic colitis.  The scope was then withdrawn and the patient returned to the recovery room in stable condition.  She tolerated the procedure well and there were no immediate complications.  IMPRESSION:  Normal colonoscopy including terminal ileum.  PLAN:  Await biopsy results, and if negative, will have her evaluated by a gynecologist since GI workup has been essentially complete and negative to date. Dictated by:   Everardo All Madilyn Fireman, M.D. Attending Physician:  Louie Bun DD:  07/28/01 TD:  07/29/01 Job: [id number]

## 2011-02-01 NOTE — Procedures (Signed)
[facility]  Patient:    Lori [name], Lori [name]                      MRN: [id number] Proc. Date: 11/11/00 Adm. Date:  [id number] Attending:  Louie Bun CC:         Lenon Curt. Cassell Clement, M.D.   Procedure Report  PROCEDURE:  Esophagogastroduodenoscopy with biopsy.  ENDOSCOPIST:  Everardo All. Madilyn Fireman, M.D.  INDICATIONS FOR PROCEDURE:  Vaguely described dyspepsia, possible dysphagia, unresponsive to Prevacid.  DESCRIPTION OF PROCEDURE:   The patient was placed in the left lateral decubitus position and placed on the pulse monitor with continuous low-flow oxygen delivered by nasal cannula.  She was sedated with 70 mg IV Demerol and 7 mg IV Versed.  The Olympus video endoscope was advanced under direct vision into the oropharynx and esophagus.  The esophagus was straight and of normal caliber with the squamocolumnar line at 38 cm.  The GE junction appeared to be slightly patulous, but no hiatal hernia was clearly seen.  The Z-line was slightly irregular with a small area of friability, possibly consistent with a mild esophagitis.  The stomach was entered, and a small amount of liquid secretion was suctioned from the fundus.  Retroflexed view of the cardia was unremarkable.  The fundus, body, and antrum all appeared normal.  The antrum showed radially oriented streaks of erythema extending a few centimeters proximal to the pylorus.  The pylorus itself was widely patent and easily allowed passage of the endoscope tip into the duodenum.  Both the bulb and second portion were well inspected and appeared to be within normal limits. The scope was drawn back into the stomach and a CLOtest obtained.  The scope was then withdrawn, and the patient returned to the recovery room in stable condition.  She tolerated the procedure well, and there were no immediate complications.  IMPRESSION: 1. Mild antral gastritis. 2. Minimal esophagitis.  PLAN:  Await CLOtest and treat  for eradication of Helicobacter if positive. If CLOtest is negative, will consider ultrasound of liver, gallbladder, and pancreas. DD:  11/11/00 TD:  11/12/00 Job: [id number]

## 2011-03-07 ENCOUNTER — Other Ambulatory Visit: Payer: Self-pay | Admitting: Gastroenterology

## 2011-03-28 ENCOUNTER — Emergency Department (HOSPITAL_COMMUNITY)
Admission: EM | Admit: 2011-03-28 | Discharge: 2011-03-28 | Disposition: A | Discharge status: Home / Self Care | Payer: PRIVATE HEALTH INSURANCE | Attending: Emergency Medicine | Admitting: Emergency Medicine

## 2011-03-28 ENCOUNTER — Emergency Department (HOSPITAL_COMMUNITY): Payer: PRIVATE HEALTH INSURANCE

## 2011-03-28 DIAGNOSIS — R112 Nausea with vomiting, unspecified: Secondary | ICD-10-CM | POA: Insufficient documentation

## 2011-03-28 DIAGNOSIS — I10 Essential (primary) hypertension: Secondary | ICD-10-CM | POA: Insufficient documentation

## 2011-03-28 DIAGNOSIS — M25539 Pain in unspecified wrist: Secondary | ICD-10-CM | POA: Insufficient documentation

## 2011-03-28 DIAGNOSIS — R5381 Other malaise: Secondary | ICD-10-CM | POA: Insufficient documentation

## 2011-03-28 DIAGNOSIS — G8929 Other chronic pain: Secondary | ICD-10-CM | POA: Insufficient documentation

## 2011-03-28 DIAGNOSIS — F341 Dysthymic disorder: Secondary | ICD-10-CM | POA: Insufficient documentation

## 2011-03-28 DIAGNOSIS — R5383 Other fatigue: Secondary | ICD-10-CM | POA: Insufficient documentation

## 2011-03-28 LAB — TROPONIN I: Troponin I: <0.3 ng/mL (ref <0.30)

## 2011-03-28 LAB — DIFFERENTIAL
Basophils Absolute: 0 10*3/uL (ref 0.0–0.1)
Basophils Relative: 1 % (ref 0–1)
Eosinophils Absolute: 0.1 10*3/uL (ref 0.0–0.7)
Neutro Abs: 6.8 10*3/uL (ref 1.7–7.7)
Neutrophils Relative %: 81 % — ABNORMAL HIGH (ref 43–77)

## 2011-03-28 LAB — COMPREHENSIVE METABOLIC PANEL
ALT: 8 U/L (ref 0–35)
AST: 11 U/L (ref 0–37)
Albumin: 3.7 g/dL (ref 3.5–5.2)
Alkaline Phosphatase: 100 U/L (ref 39–117)
BUN: 8 mg/dL (ref 6–23)
Chloride: 104 mEq/L (ref 96–112)
Potassium: 3.7 mEq/L (ref 3.5–5.1)
Sodium: 136 mEq/L (ref 135–145)
Total Bilirubin: 0.2 mg/dL — ABNORMAL LOW (ref 0.3–1.2)
Total Protein: 6.6 g/dL (ref 6.0–8.3)

## 2011-03-28 LAB — LIPASE, BLOOD: Lipase: 29 U/L (ref 11–59)

## 2011-03-28 LAB — CBC
Hemoglobin: 13.1 g/dL (ref 12.0–15.0)
MCH: 31.3 pg (ref 26.0–34.0)
MCHC: 33.9 g/dL (ref 30.0–36.0)
Platelets: 230 10*3/uL (ref 150–400)
RBC: 4.19 MIL/uL (ref 3.87–5.11)

## 2011-03-28 LAB — URINALYSIS, ROUTINE W REFLEX MICROSCOPIC
Nitrite: NEGATIVE
Protein, ur: NEGATIVE mg/dL
Specific Gravity, Urine: 1.02 (ref 1.005–1.030)
Urobilinogen, UA: 0.2 mg/dL (ref 0.0–1.0)

## 2011-03-28 LAB — CK TOTAL AND CKMB (NOT AT ARMC): Relative Index: INVALID (ref 0.0–2.5)

## 2011-03-28 LAB — RAPID URINE DRUG SCREEN, HOSP PERFORMED
Barbiturates: POSITIVE — AB
Opiates: NOT DETECTED
Tetrahydrocannabinol: NOT DETECTED

## 2011-03-28 LAB — URINE MICROSCOPIC-ADD ON

## 2011-03-29 LAB — URINE CULTURE: Colony Count: 8000

## 2011-05-05 ENCOUNTER — Emergency Department (HOSPITAL_COMMUNITY)
Admission: EM | Admit: 2011-05-05 | Discharge: 2011-05-05 | Disposition: A | Discharge status: Home / Self Care | Payer: PRIVATE HEALTH INSURANCE | Attending: Emergency Medicine | Admitting: Emergency Medicine

## 2011-05-05 ENCOUNTER — Emergency Department (HOSPITAL_COMMUNITY): Payer: PRIVATE HEALTH INSURANCE

## 2011-05-05 DIAGNOSIS — M549 Dorsalgia, unspecified: Secondary | ICD-10-CM | POA: Insufficient documentation

## 2011-05-05 DIAGNOSIS — Y92009 Unspecified place in unspecified non-institutional (private) residence as the place of occurrence of the external cause: Secondary | ICD-10-CM | POA: Insufficient documentation

## 2011-05-05 DIAGNOSIS — M542 Cervicalgia: Secondary | ICD-10-CM | POA: Insufficient documentation

## 2011-05-05 DIAGNOSIS — W108XXA Fall (on) (from) other stairs and steps, initial encounter: Secondary | ICD-10-CM | POA: Insufficient documentation

## 2011-05-05 DIAGNOSIS — M25519 Pain in unspecified shoulder: Secondary | ICD-10-CM | POA: Insufficient documentation

## 2011-05-05 DIAGNOSIS — Z981 Arthrodesis status: Secondary | ICD-10-CM | POA: Insufficient documentation

## 2011-05-21 IMAGING — CR DG ANKLE COMPLETE 3+V*R*
3 series · 3 of 3 positions shown · non-contrast
Comparison: None.

CLINICAL DATA: Right ankle pain following a fall.

RIGHT ANKLE - COMPLETE 3+ VIEW

[view not recorded (1 of 3)]
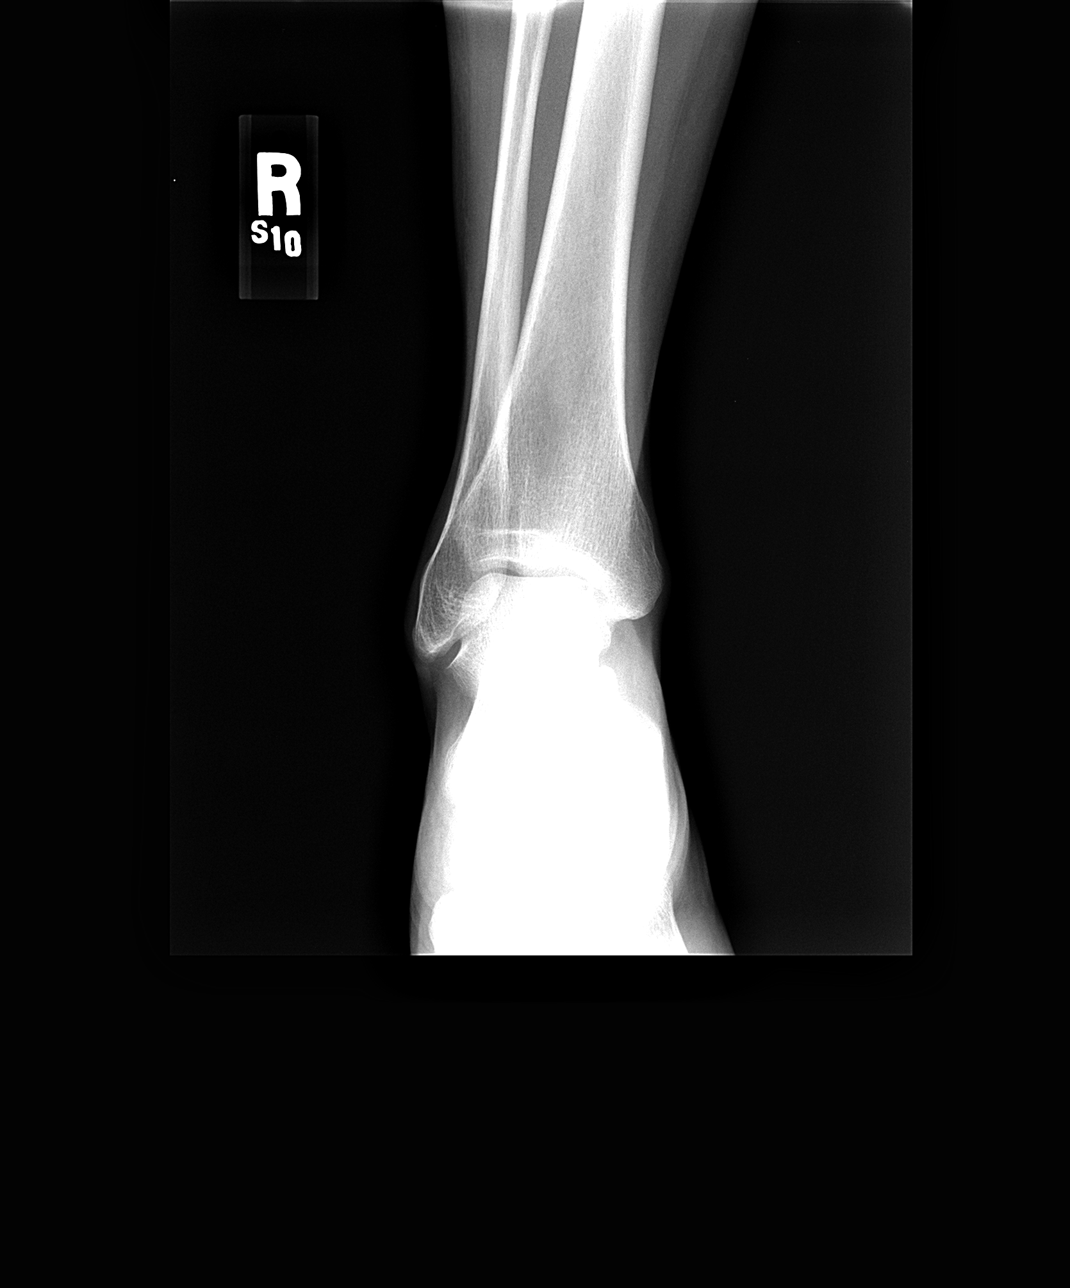

[view not recorded (2 of 3)]
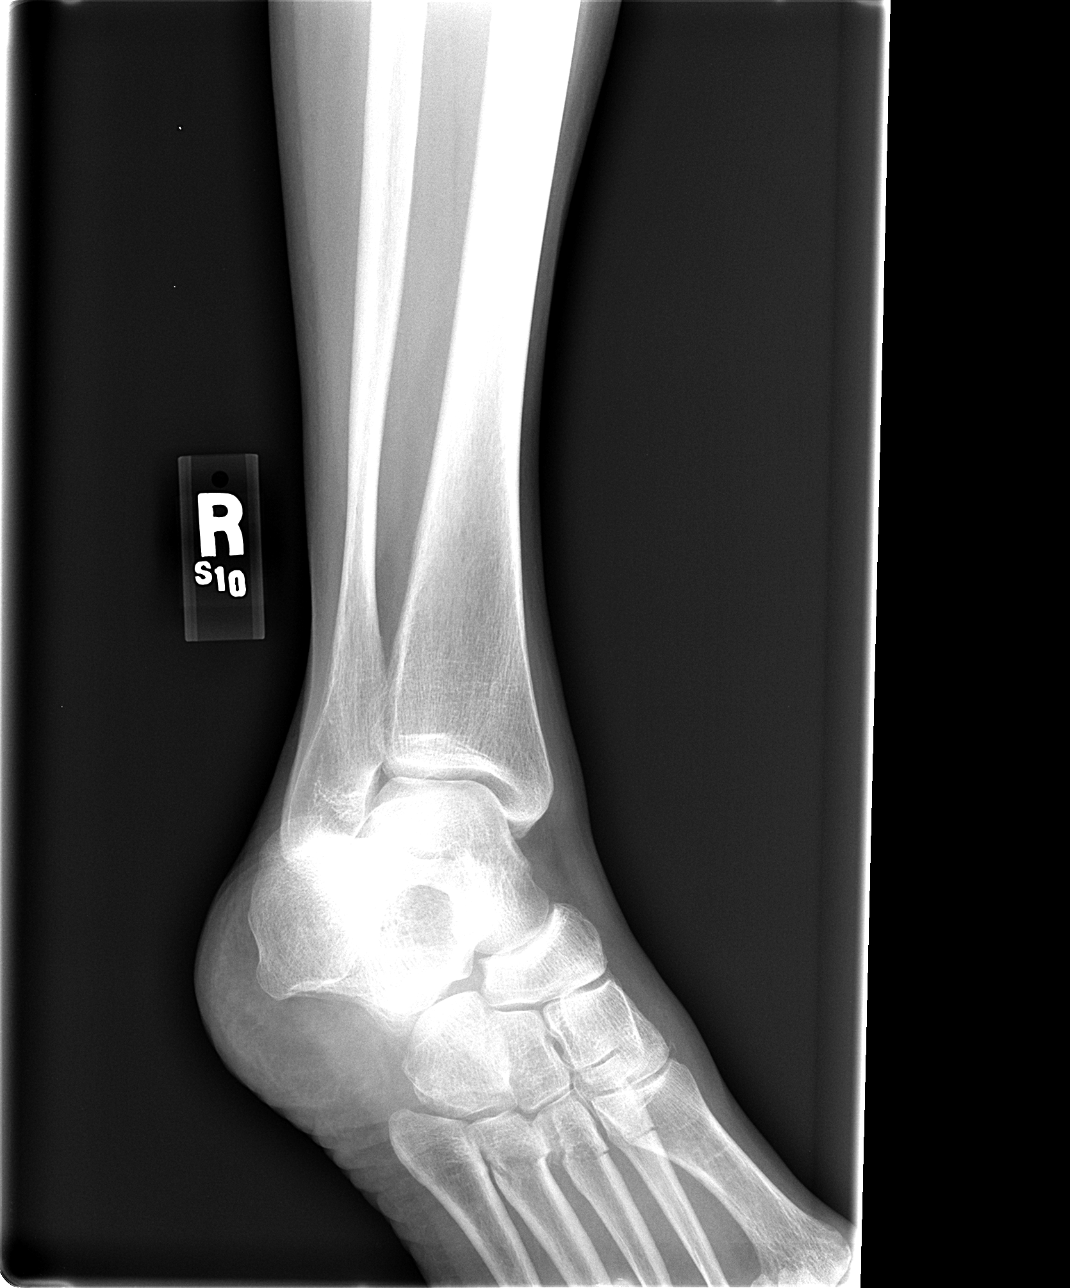

[view not recorded (3 of 3)]
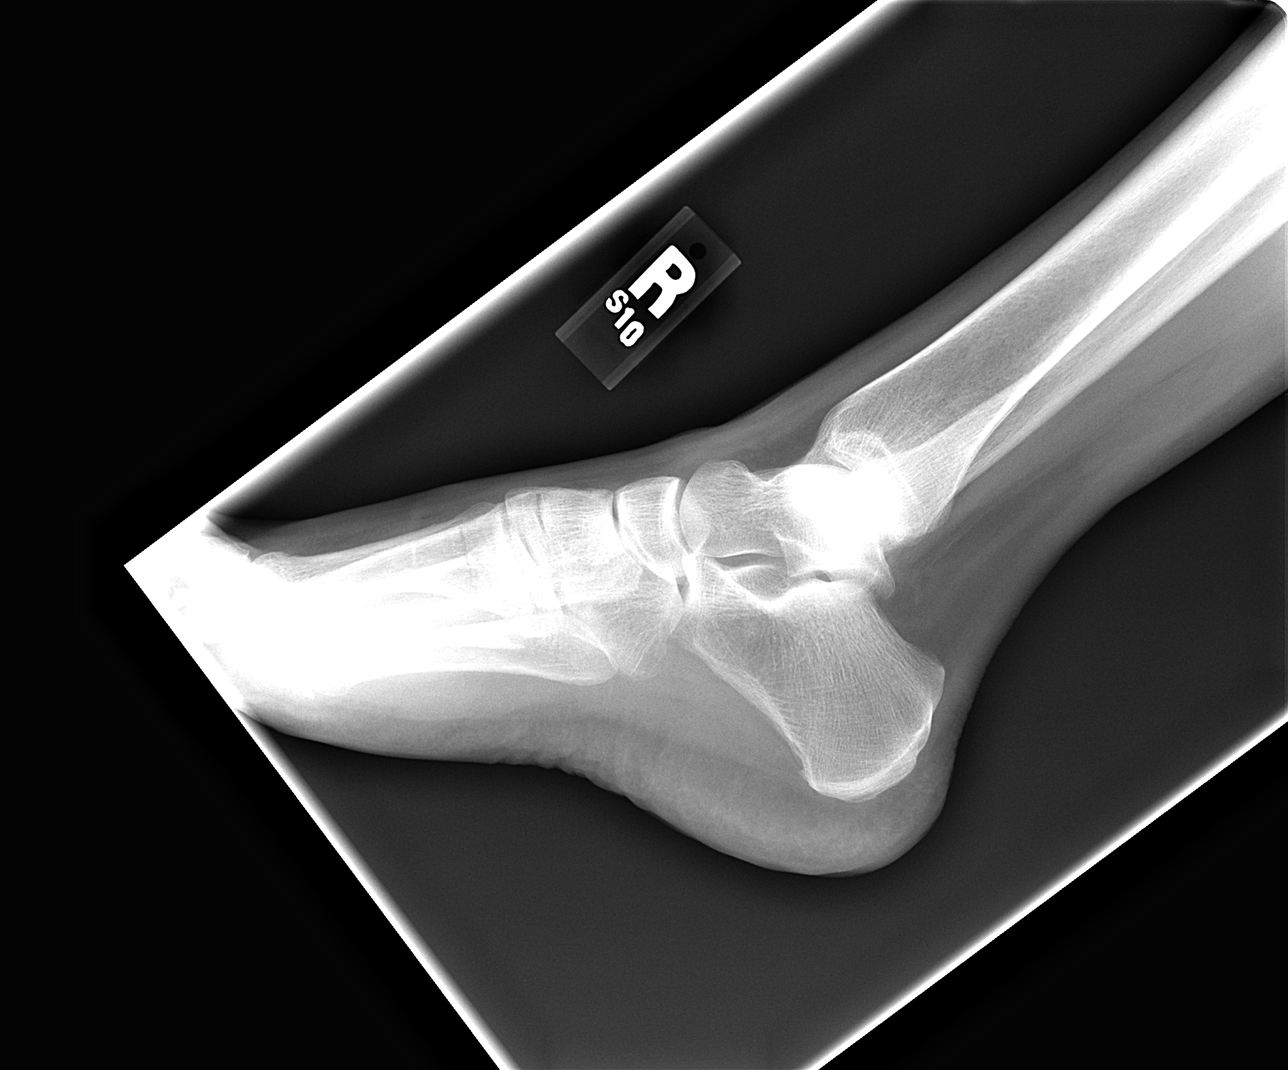

[3 of 3 positions shown; findings below may reference images not displayed]

FINDINGS: Essentially nondisplaced and nonangulated fractures
through the proximal second, third and fourth metatarsals.  There
is also a possible nondisplaced fracture in the base of the fifth
metatarsal.  A small avulsion fracture off the dorsal medial aspect
of the medial cuneiform is noted with overlying soft tissue
swelling.  No ankle fractures or dislocations are seen.  No ankle
joint effusion demonstrated.
IMPRESSION: 1.  Essentially nondisplaced fractures of the proximal second,
third and fourth metatarsals.
2.  Possible nondisplaced fracture of the base of the fifth
metatarsal.
3.  Small medial cuneiform avulsion fracture.

## 2011-05-21 IMAGING — CR DG ELBOW COMPLETE 3+V*R*
4 series · 4 of 4 positions shown · non-contrast
Comparison: Right wrist series 04/06/2004.

CLINICAL DATA: 52-year-old female status post fall.

RIGHT ELBOW - COMPLETE 3+ VIEW,
RIGHT FOREARM - 2 VIEW

[view not recorded (1 of 4)]
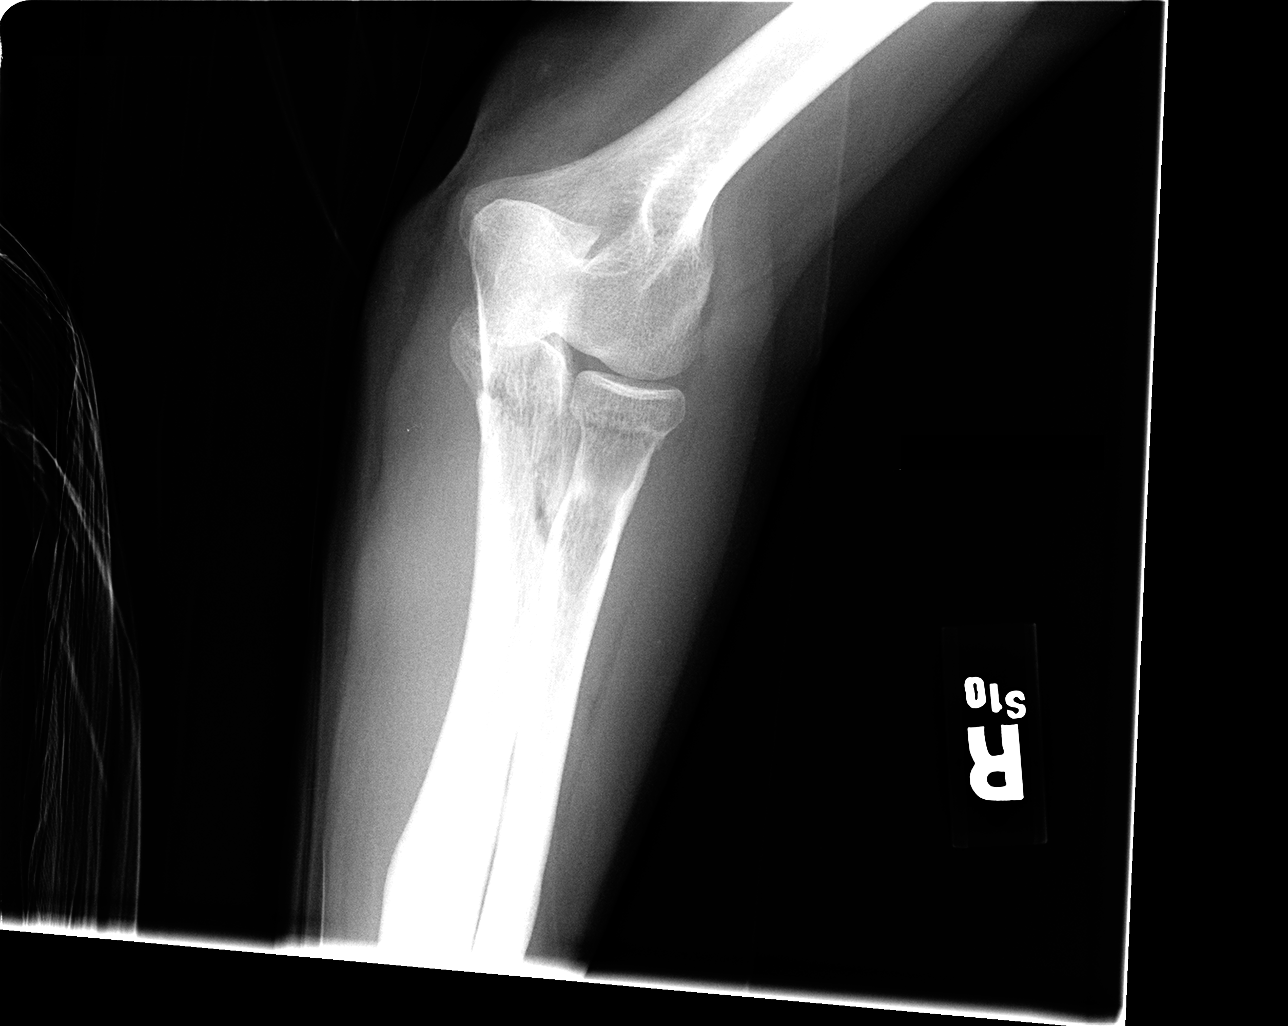

[view not recorded (2 of 4)]
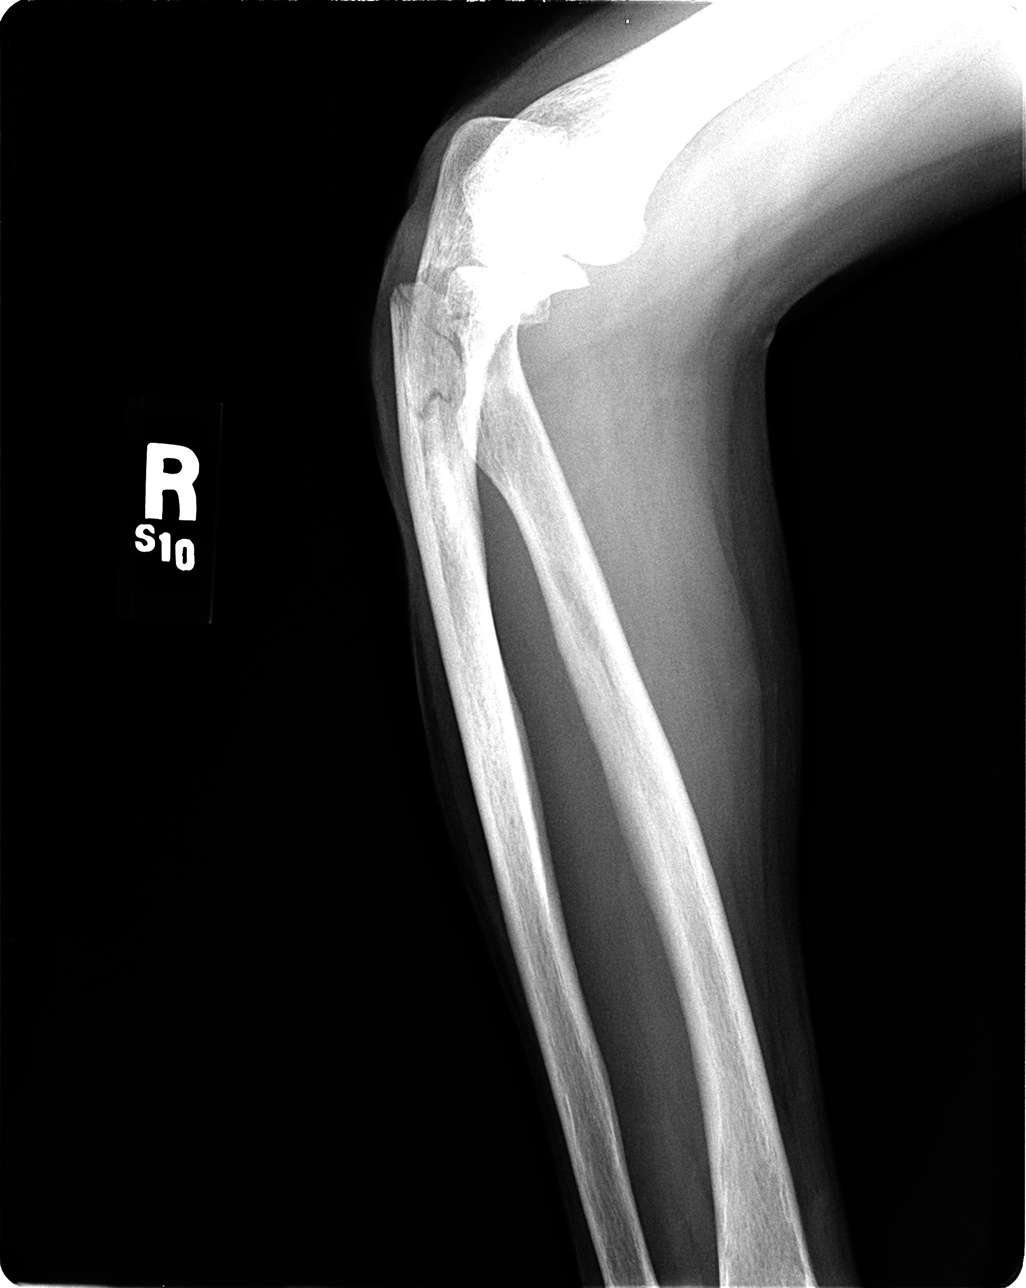

[view not recorded (3 of 4)]
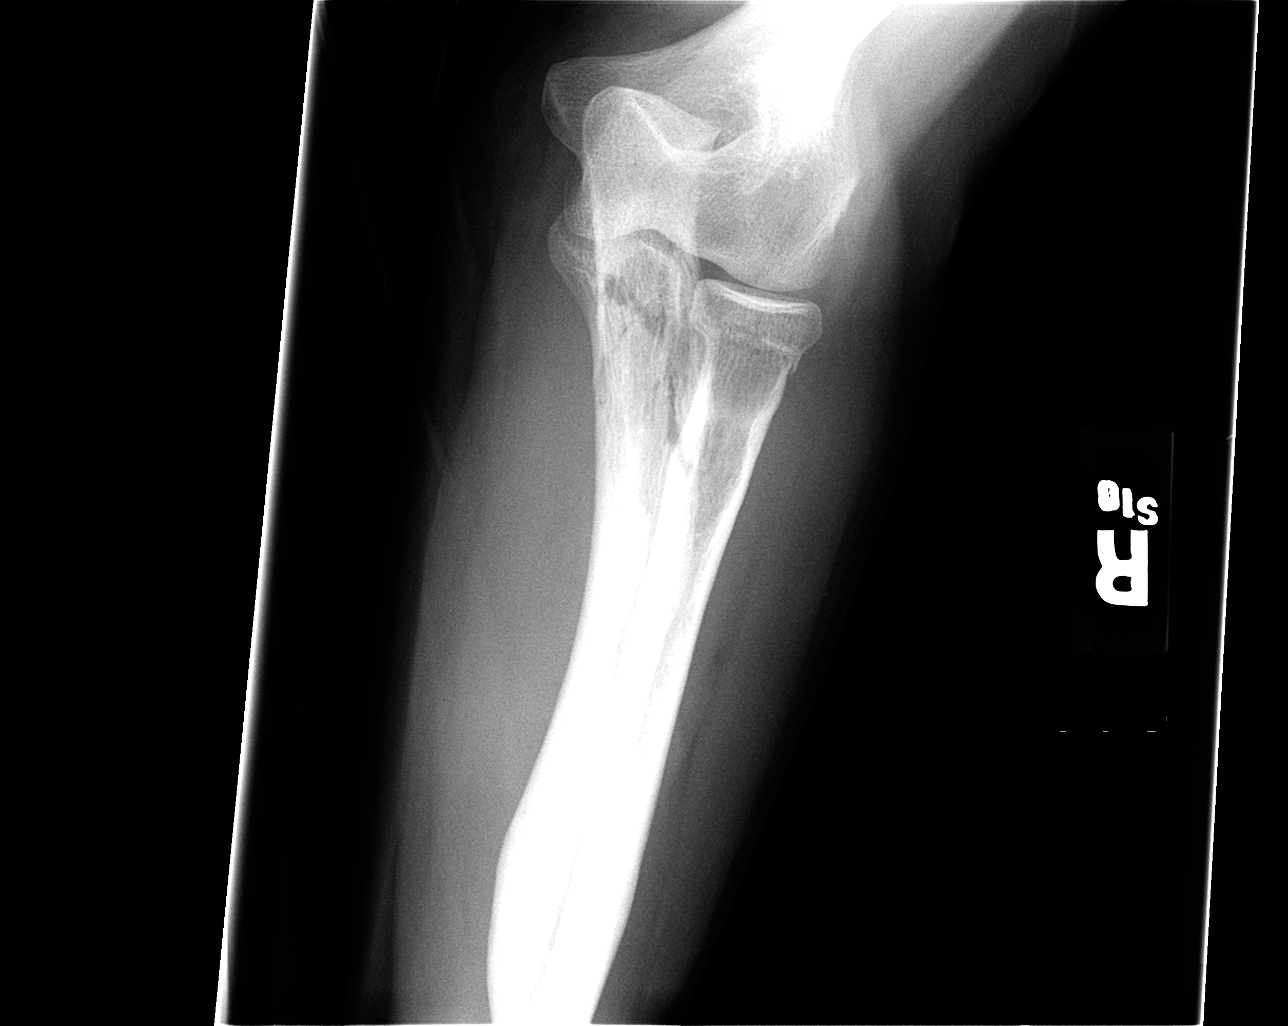

[view not recorded (4 of 4)]
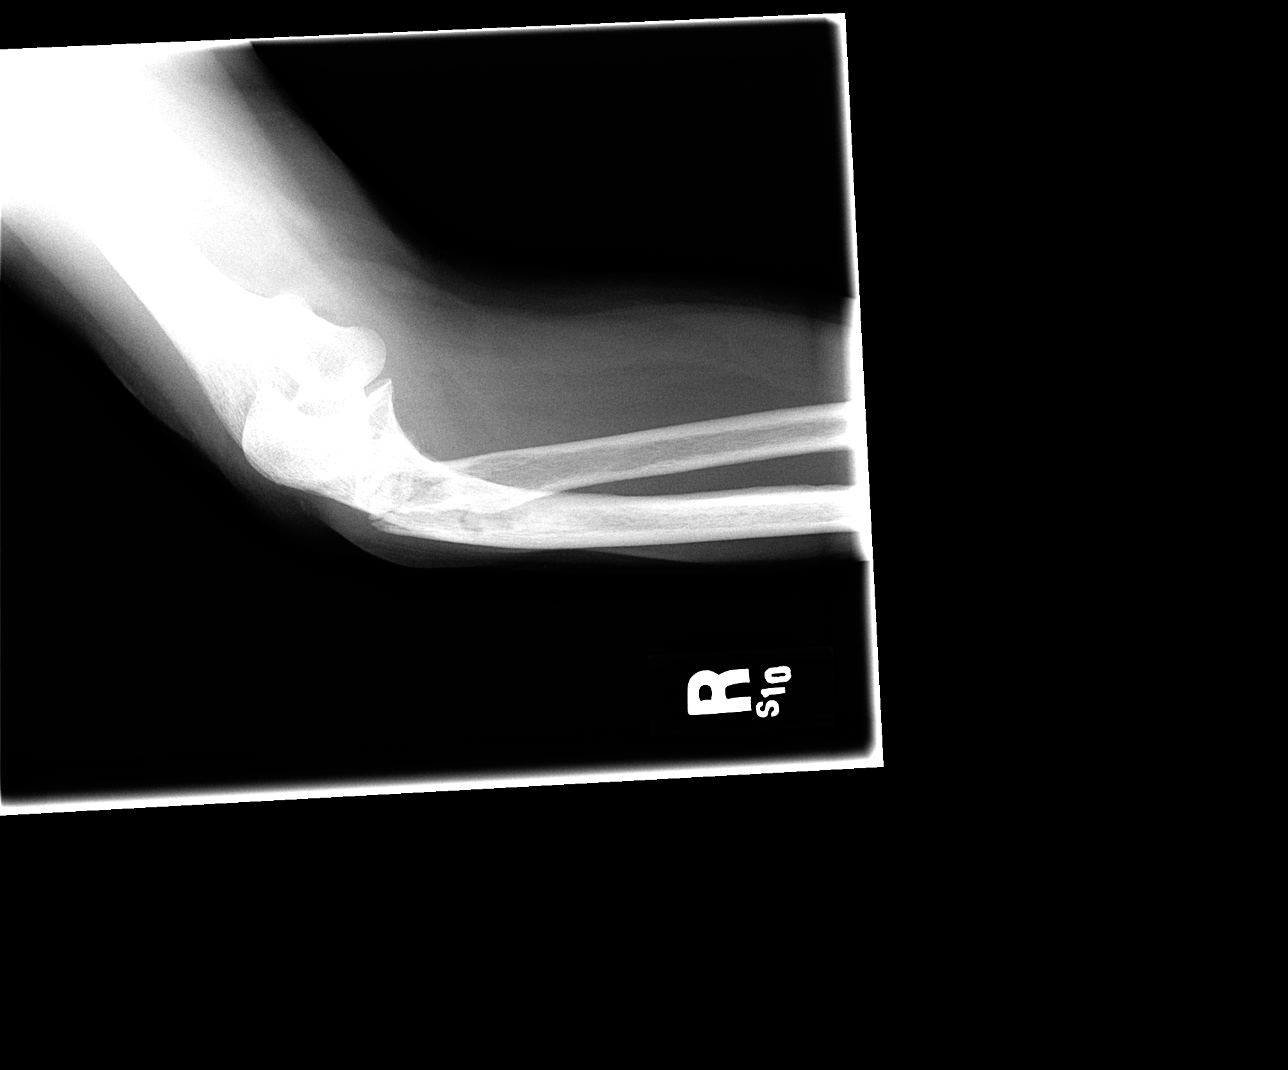

[4 of 4 positions shown; findings below may reference images not displayed]

FINDINGS: Lateral view the right elbow series is oblique.  There is
a comminuted, impacted and anteriorly angulated fracture of the
proximal ulna metadiaphysis without intra-articular extension.
There is an impacted, slightly posteriorly displaced, and
anteriorly angulated fracture of the right radial head without
intra-articular extension. Alignment of the right radial head and
capitellum appears preserved.  Distal right humerus appears grossly
intact.

The more distal right ulna is remarkable for smooth, undulating
sclerotic thickening of the cortex.  Distal right radius and ulna
appear stable and intact.  Carpal bone alignment appears grossly
preserved.
IMPRESSION: 1.  Comminuted, impacted, and anteriorly angulated fracture of the
proximal right ulna.
2.  Impacted, posteriorly displaced, and anteriorly angulated
fracture of the right  radial head.
3.  Chronic-appearing sclerosis of the more distal right ulna,
favor melorheostosis.

## 2011-05-21 IMAGING — CR DG FOREARM 2V*R*
3 series · 3 of 3 positions shown · non-contrast
Comparison: Right wrist series 04/06/2004.

CLINICAL DATA: 52-year-old female status post fall.

RIGHT ELBOW - COMPLETE 3+ VIEW,
RIGHT FOREARM - 2 VIEW

[view not recorded (1 of 3)]
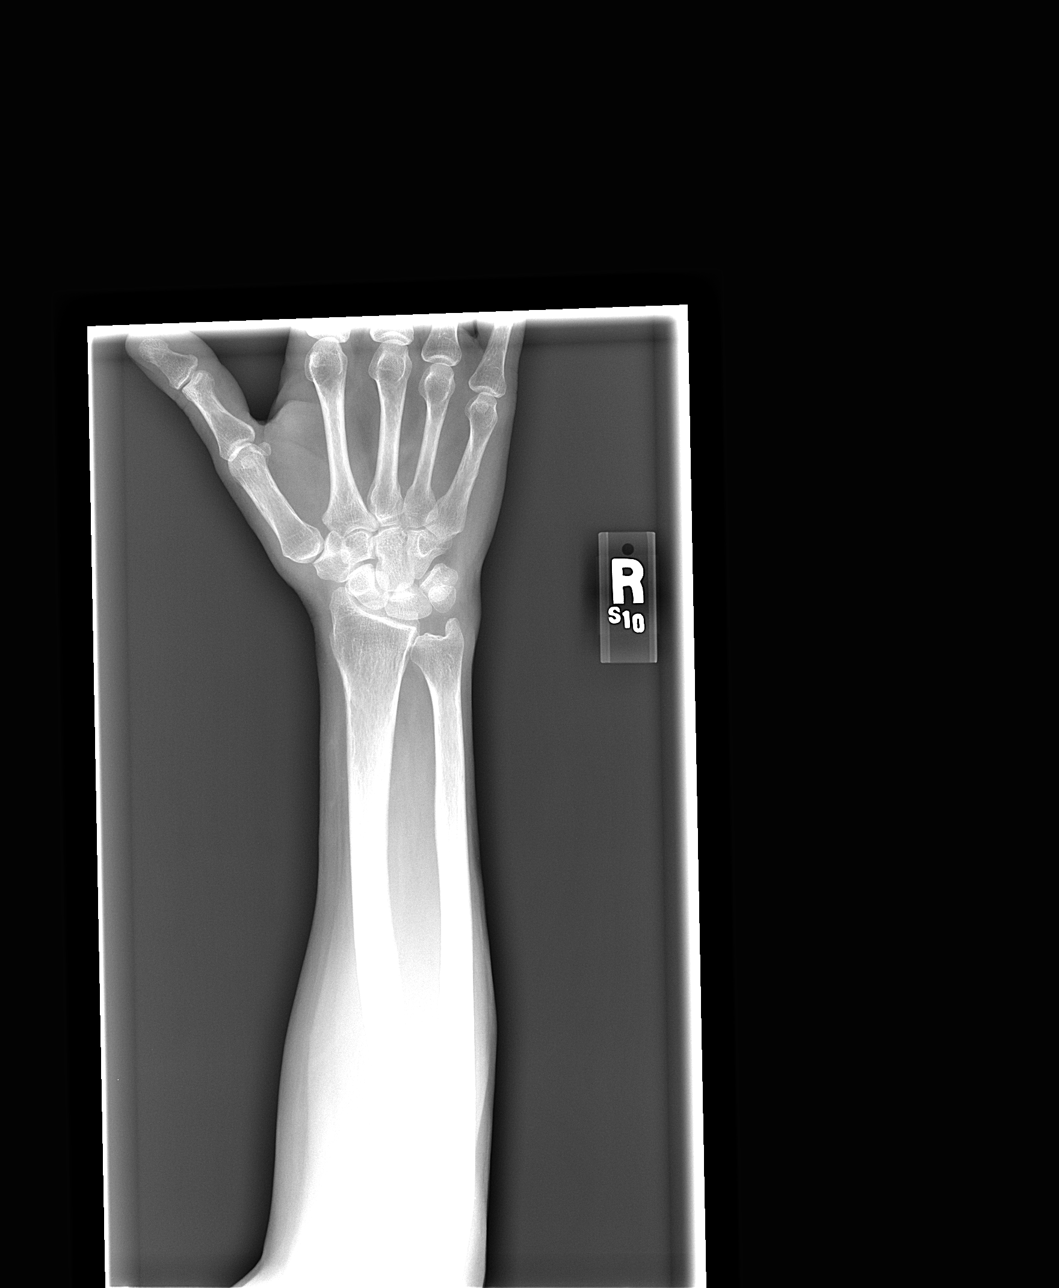

[view not recorded (2 of 3)]
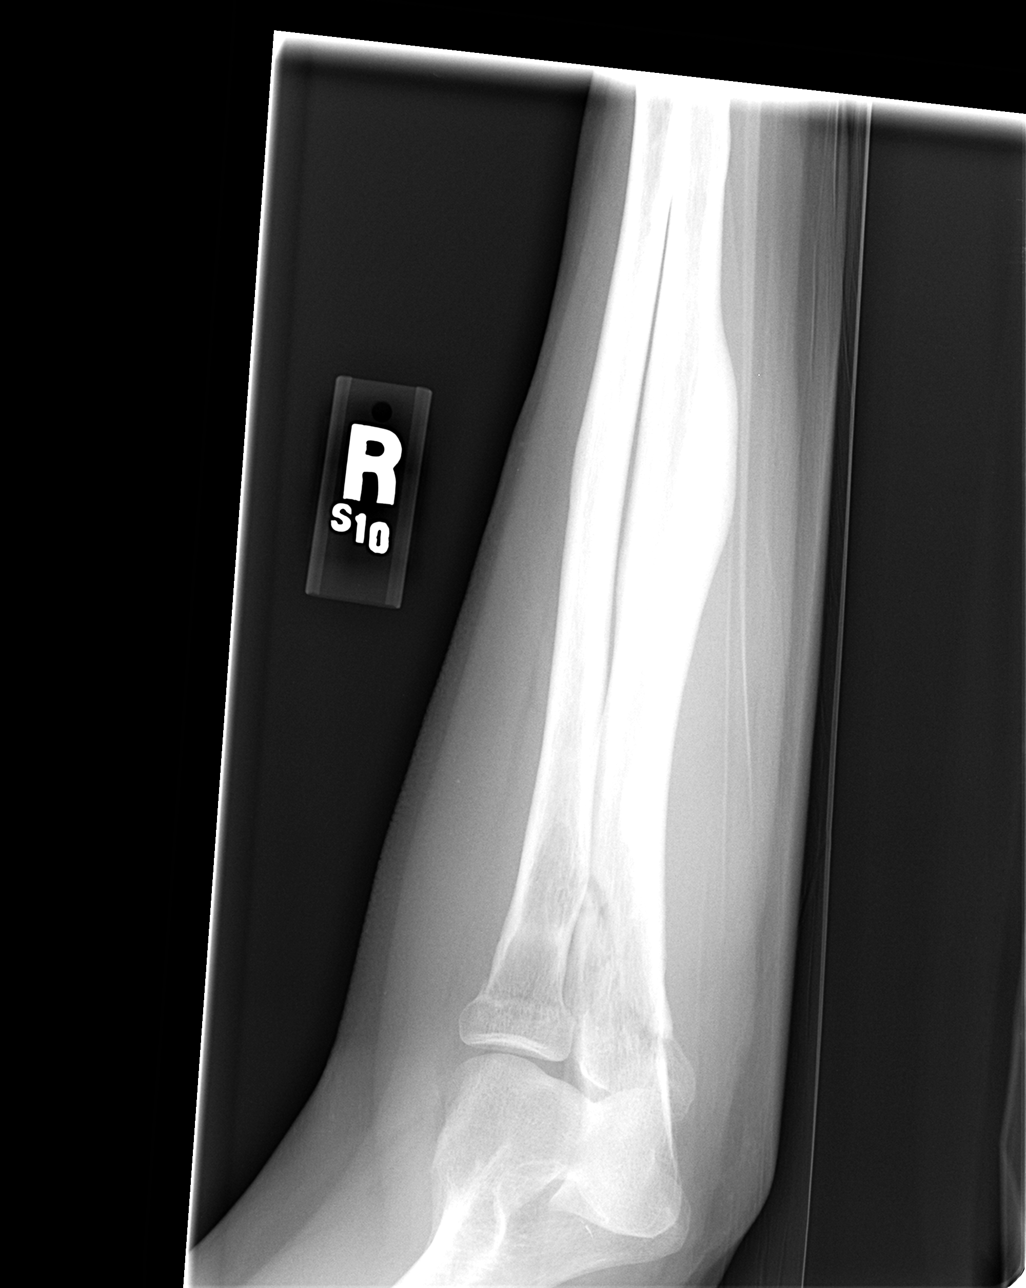

[view not recorded (3 of 3)]
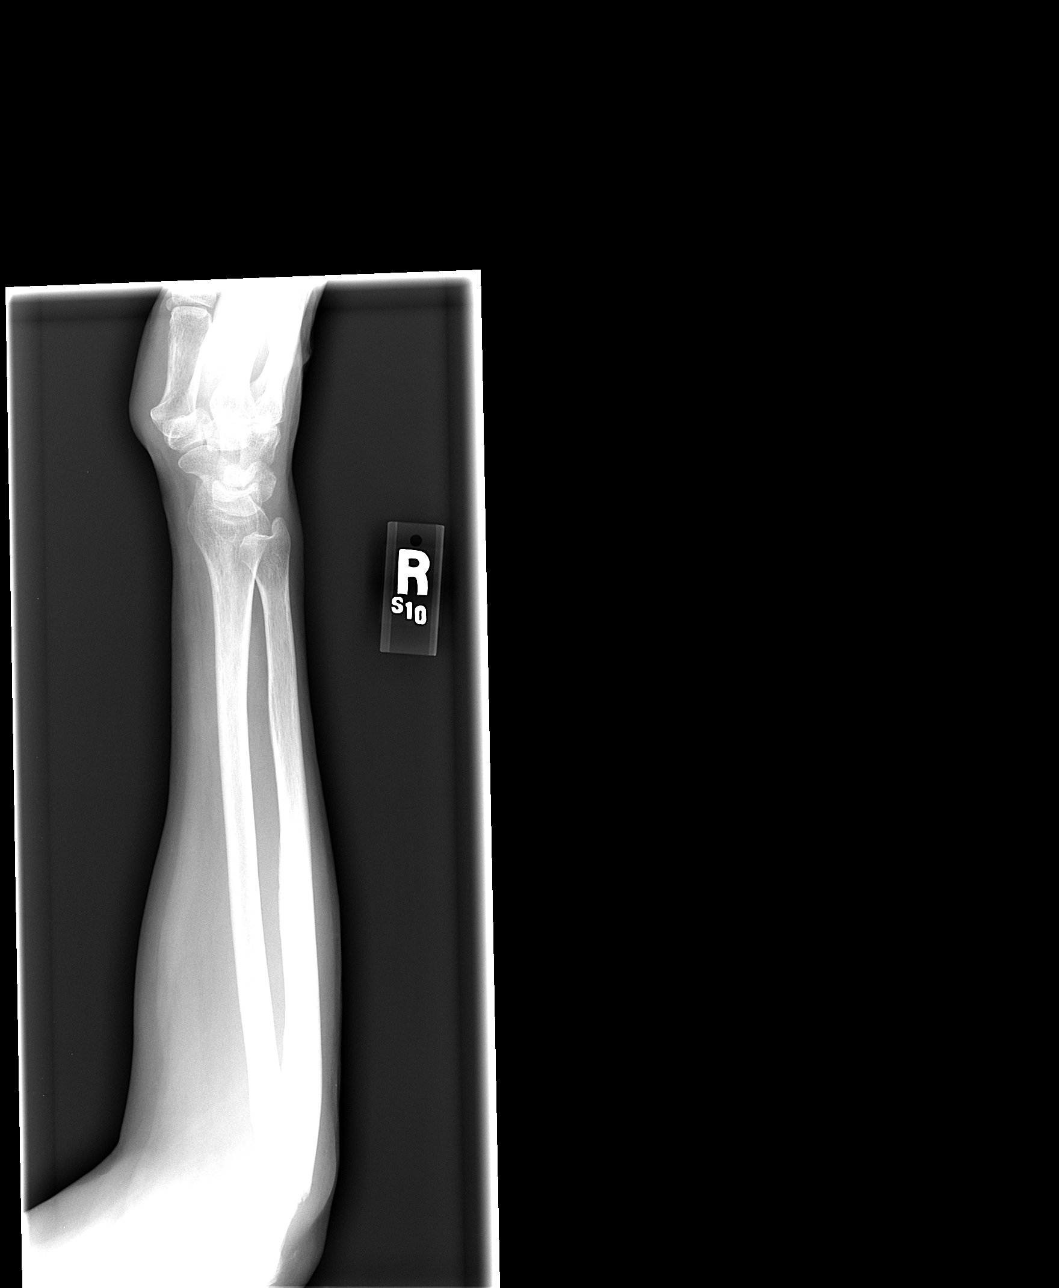

[3 of 3 positions shown; findings below may reference images not displayed]

FINDINGS: Lateral view the right elbow series is oblique.  There is
a comminuted, impacted and anteriorly angulated fracture of the
proximal ulna metadiaphysis without intra-articular extension.
There is an impacted, slightly posteriorly displaced, and
anteriorly angulated fracture of the right radial head without
intra-articular extension. Alignment of the right radial head and
capitellum appears preserved.  Distal right humerus appears grossly
intact.

The more distal right ulna is remarkable for smooth, undulating
sclerotic thickening of the cortex.  Distal right radius and ulna
appear stable and intact.  Carpal bone alignment appears grossly
preserved.
IMPRESSION: 1.  Comminuted, impacted, and anteriorly angulated fracture of the
proximal right ulna.
2.  Impacted, posteriorly displaced, and anteriorly angulated
fracture of the right  radial head.
3.  Chronic-appearing sclerosis of the more distal right ulna,
favor melorheostosis.

## 2011-05-25 IMAGING — CR DG CHEST 1V PORT
1 series · 1 of 1 positions shown · non-contrast
Comparison: 03/27/2009

CLINICAL DATA: Chest pain.  Emphysema.

CHEST - 1 VIEW

[view not recorded]
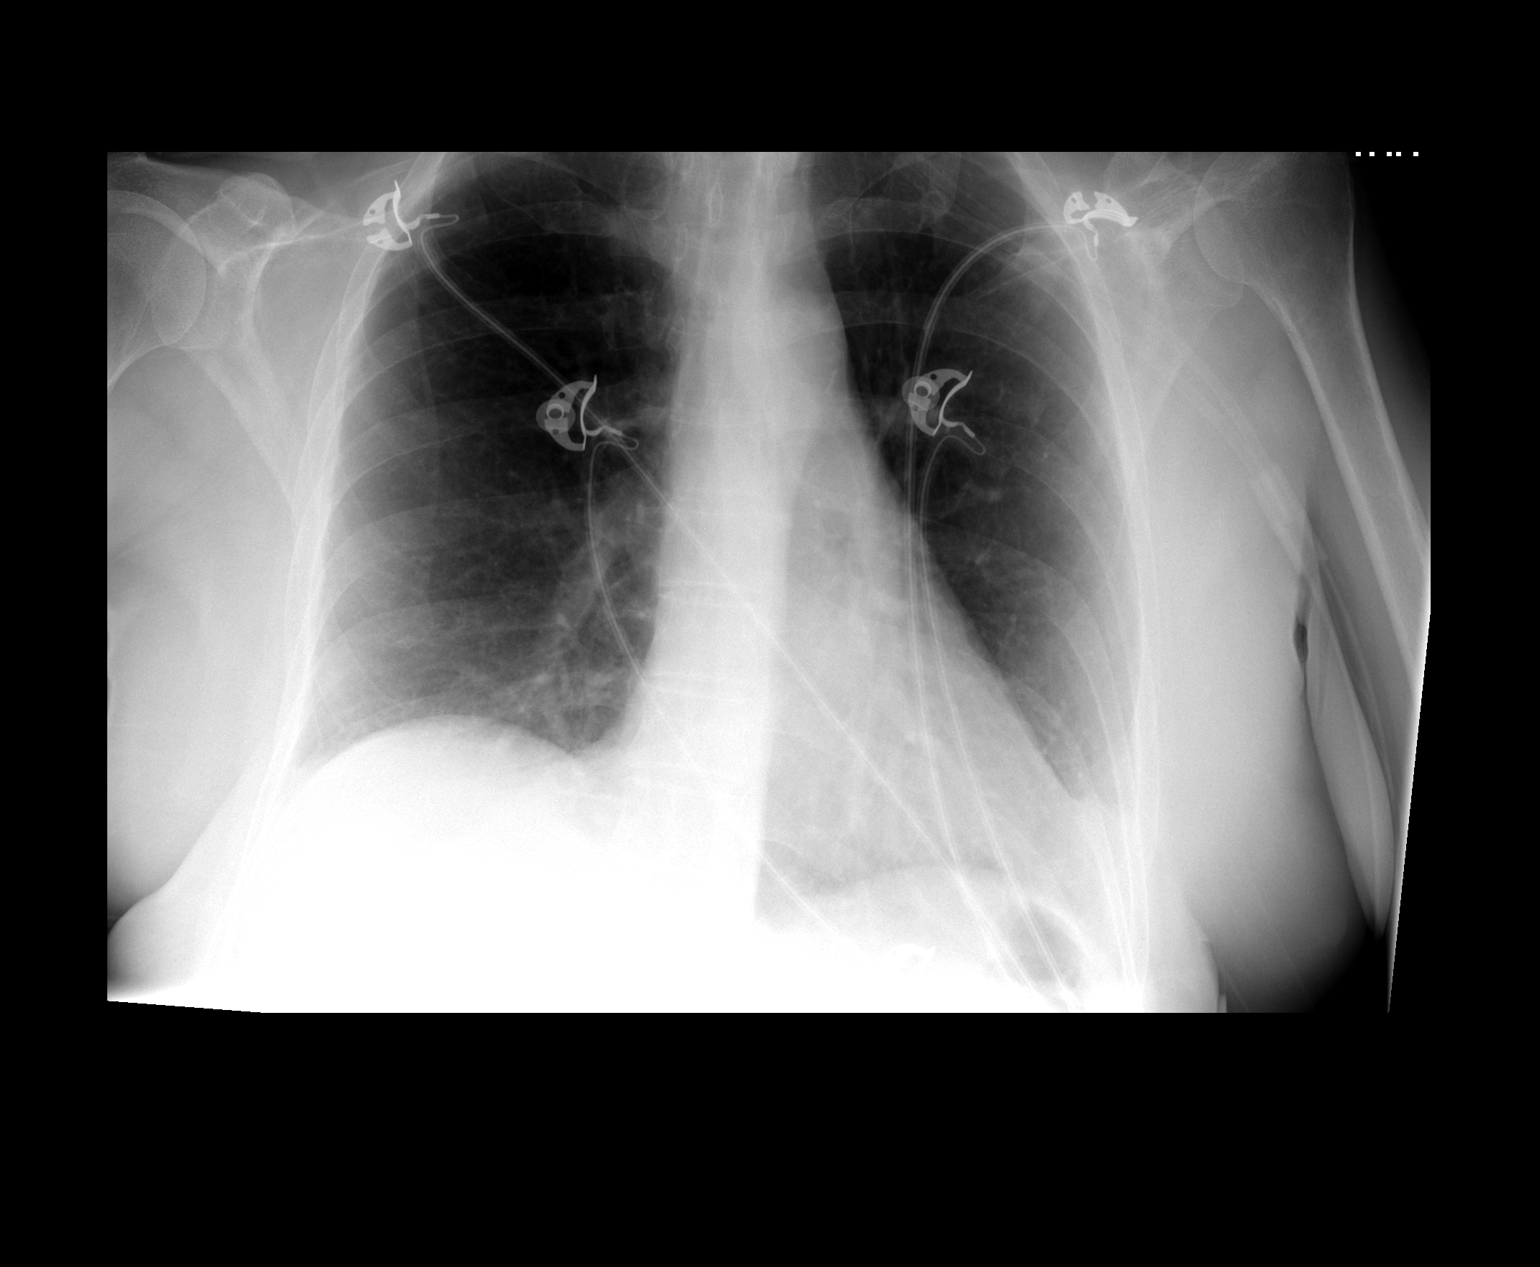

[1 of 1 positions shown; findings below may reference images not displayed]

FINDINGS: The heart size and mediastinal contours are within normal
limits.  Both lungs are clear.
IMPRESSION: No active disease.

## 2011-05-25 IMAGING — CT CT ANGIO CHEST
2 of 6 series · 20 of 36 positions shown · IV contrast (agent unspecified)
Comparison: 03/31/2009 chest radiograph.

CLINICAL DATA: Chest pain.

CT ANGIOGRAPHY CHEST WITH CONTRAST
TECHNIQUE: Multidetector CT imaging of the chest was performed
using the standard protocol during bolus administration of
intravenous contrast. Multiplanar CT image reconstructions
including MIPs were obtained to evaluate the vascular anatomy.
Contrast: 100 ml [id number]-S77 IV.

[Series 8: pulm embolism 1.0 b25f thins · axial · 0.58mm/px · z∈[-251,-16]mm · 19 of 263 slices shown]
[im 14/263  lung]
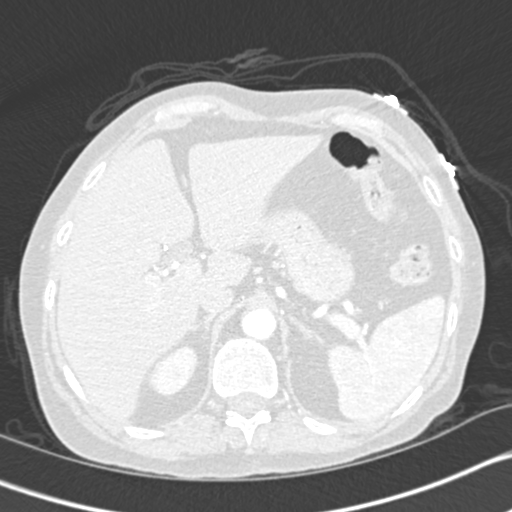
[im 27/263  mediastinal]
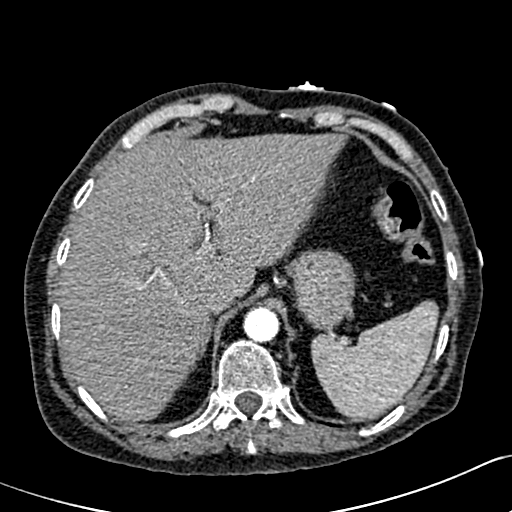
[im 40/263  lung]
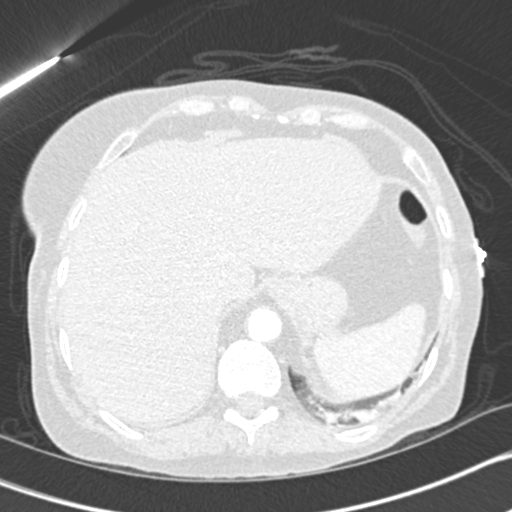
[im 53/263  mediastinal]
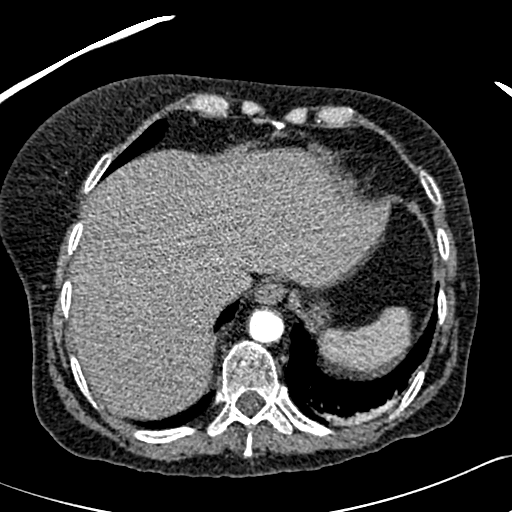
[im 66/263  lung]
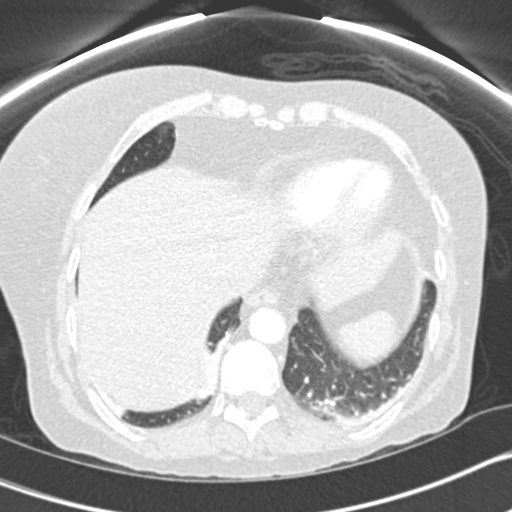
[im 79/263  mediastinal]
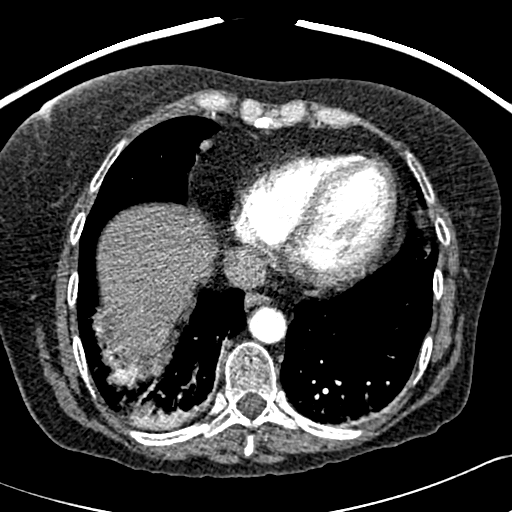
[im 92/263  lung]
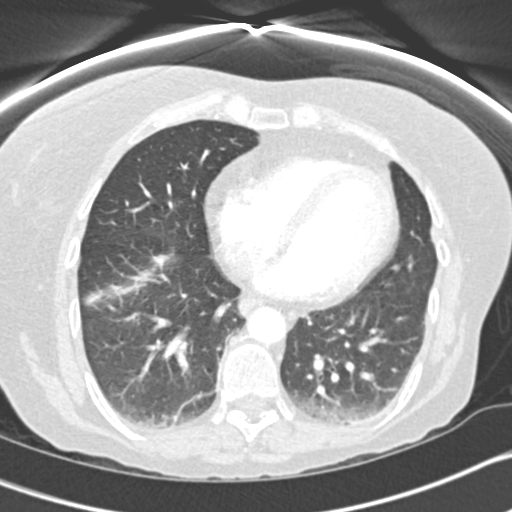
[im 105/263  mediastinal]
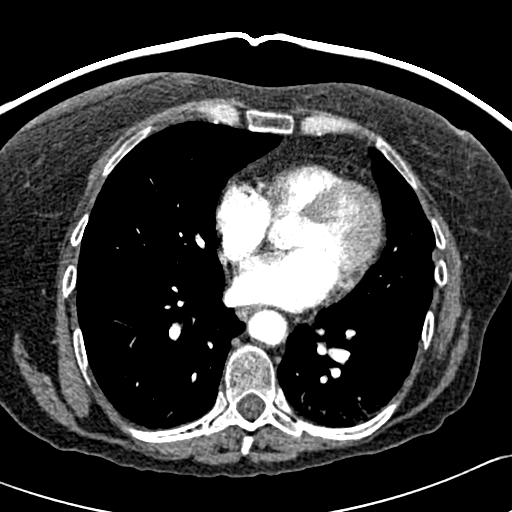
[im 118/263  lung]
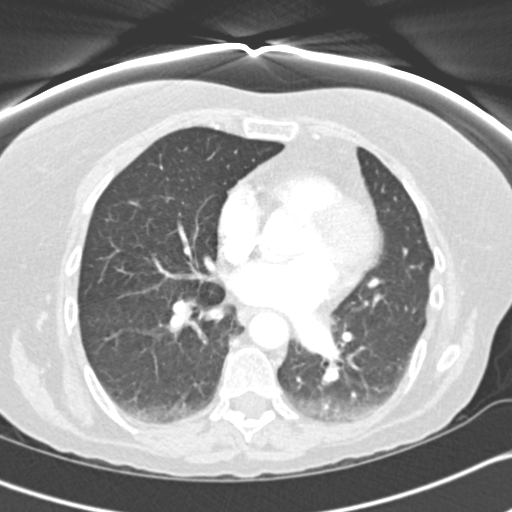
[im 132/263  mediastinal]
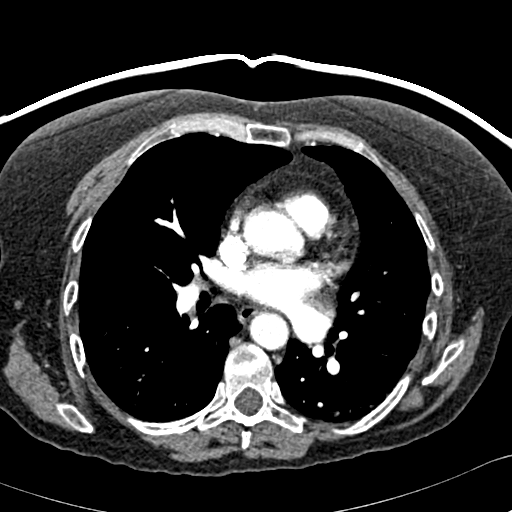
[im 145/263  lung]
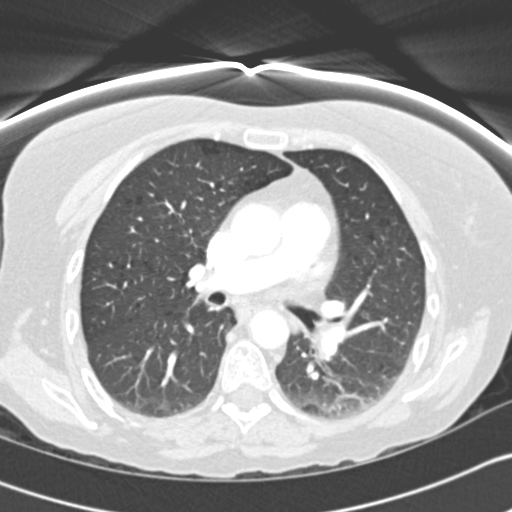
[im 158/263  mediastinal]
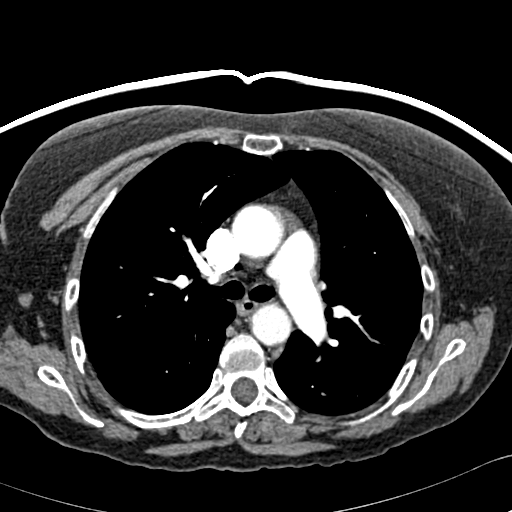
[im 171/263  lung]
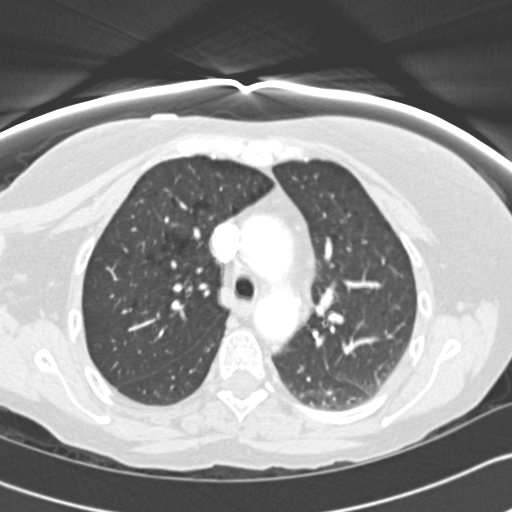
[im 184/263  mediastinal]
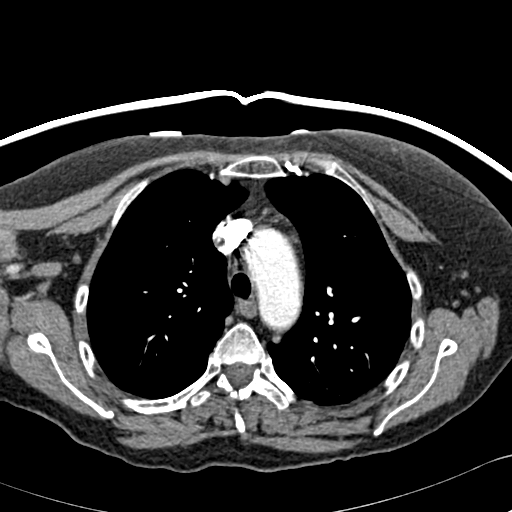
[im 197/263  lung]
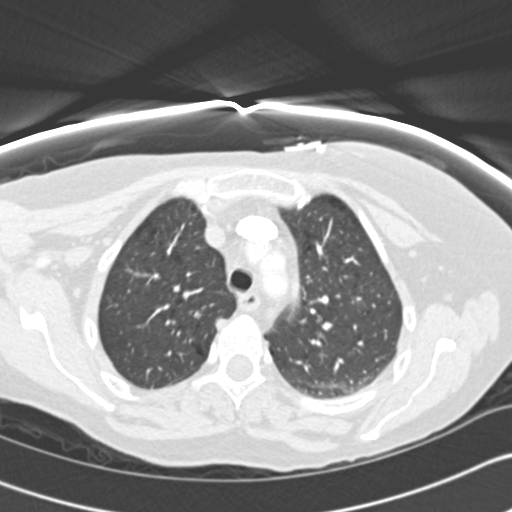
[im 210/263  mediastinal]
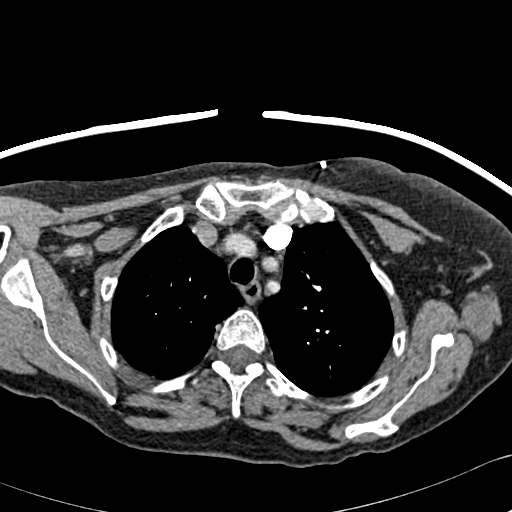
[im 223/263  lung]
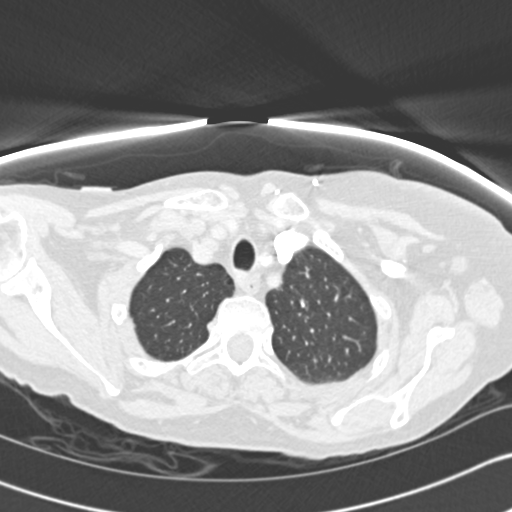
[im 236/263  mediastinal]
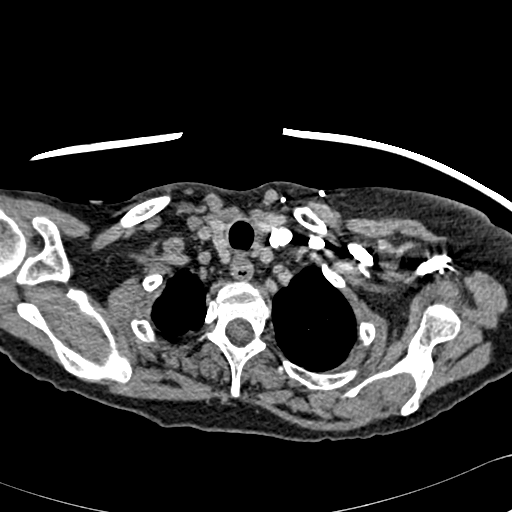
[im 249/263  lung]
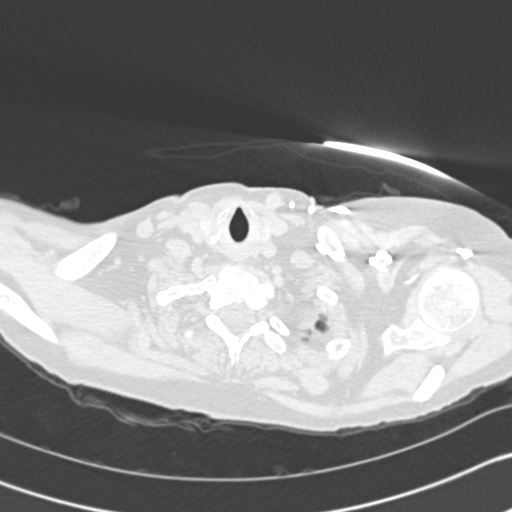

[Series 602: cor mpr · coronal · 0.58mm/px · 1 of 94 slices shown]
[im 47/94  mediastinal]
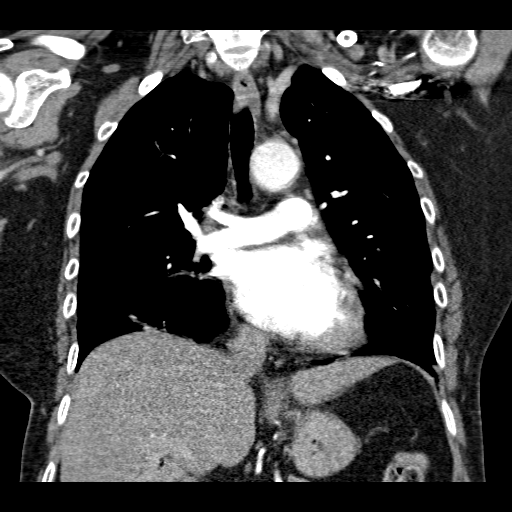

[20 of 36 positions shown; findings below may reference images not displayed]

FINDINGS: Negative for PE.  No acute aortic abnormality.  No
pericardial or pleural effusions.  Atelectatic changes at the
posterior lung bases.  Mild centrilobular emphysema.

Review of the MIP images confirms the above findings.
IMPRESSION: Specifically, this exam is negative for PE.

## 2011-06-10 LAB — SAMPLE TO BLOOD BANK

## 2011-06-10 LAB — DIFFERENTIAL
Basophils Relative: 0
Lymphocytes Relative: 25
Lymphs Abs: 2.5
Monocytes Absolute: 0.5
Monocytes Relative: 5
Neutro Abs: 6.9
Neutrophils Relative %: 68

## 2011-06-10 LAB — CBC
HCT: 27.8 — ABNORMAL LOW
Hemoglobin: 9.4 — ABNORMAL LOW
MCHC: 33.7
Platelets: 343
RDW: 17.3 — ABNORMAL HIGH

## 2011-06-10 LAB — COMPREHENSIVE METABOLIC PANEL
Albumin: 3.7
BUN: 2 — ABNORMAL LOW
Calcium: 9.1
Creatinine, Ser: 0.82
Glucose, Bld: 103 — ABNORMAL HIGH
Total Protein: 6

## 2011-06-10 LAB — URINALYSIS, ROUTINE W REFLEX MICROSCOPIC
Bilirubin Urine: NEGATIVE
Hgb urine dipstick: NEGATIVE
Nitrite: NEGATIVE
Protein, ur: NEGATIVE
Specific Gravity, Urine: 1.007
Urobilinogen, UA: 0.2

## 2011-06-11 LAB — DIFFERENTIAL
Basophils Relative: 1
Eosinophils Relative: 2
Monocytes Absolute: 0.5
Monocytes Relative: 6
Neutro Abs: 3.9

## 2011-06-11 LAB — URINALYSIS, ROUTINE W REFLEX MICROSCOPIC
Bilirubin Urine: NEGATIVE
Ketones, ur: NEGATIVE
Specific Gravity, Urine: 1.013
Urobilinogen, UA: 0.2
pH: 5.5

## 2011-06-11 LAB — CBC
HCT: 30.9 — ABNORMAL LOW
Hemoglobin: 10.2 — ABNORMAL LOW
MCHC: 33
RBC: 4.18

## 2011-06-11 LAB — BASIC METABOLIC PANEL
CO2: 23
Calcium: 9.1
Chloride: 106
GFR calc Af Amer: >60
Potassium: 3.9
Sodium: 136

## 2011-06-11 LAB — URINE MICROSCOPIC-ADD ON

## 2011-06-17 LAB — BASIC METABOLIC PANEL
BUN: 8
CO2: 25
Calcium: 9.1
GFR calc non Af Amer: >60
Glucose, Bld: 92
Sodium: 137

## 2011-06-21 LAB — COMPREHENSIVE METABOLIC PANEL
CO2: 26 mEq/L (ref 19–32)
Calcium: 9.1 mg/dL (ref 8.4–10.5)
Creatinine, Ser: 0.85 mg/dL (ref 0.4–1.2)
GFR calc Af Amer: >60 mL/min (ref >60)
GFR calc non Af Amer: >60 mL/min (ref >60)
Glucose, Bld: 107 mg/dL — ABNORMAL HIGH (ref 70–99)
Sodium: 138 mEq/L (ref 135–145)
Total Protein: 6.4 g/dL (ref 6.0–8.3)

## 2011-06-21 LAB — URINALYSIS, ROUTINE W REFLEX MICROSCOPIC
Glucose, UA: NEGATIVE mg/dL
Glucose, UA: NEGATIVE
Hgb urine dipstick: NEGATIVE
Ketones, ur: NEGATIVE mg/dL
Nitrite: NEGATIVE
Protein, ur: NEGATIVE mg/dL
Protein, ur: NEGATIVE
Urobilinogen, UA: 0.2 mg/dL (ref 0.0–1.0)

## 2011-06-21 LAB — BASIC METABOLIC PANEL
CO2: 22
Chloride: 105
Creatinine, Ser: 1.13
GFR calc Af Amer: >60
Glucose, Bld: 126 — ABNORMAL HIGH

## 2011-06-21 LAB — URINE MICROSCOPIC-ADD ON

## 2011-06-21 LAB — PROTIME-INR
INR: 0.9 (ref 0.00–1.49)
Prothrombin Time: 12.5 seconds (ref 11.6–15.2)

## 2011-06-21 LAB — DIFFERENTIAL
Lymphocytes Relative: 29 % (ref 12–46)
Lymphs Abs: 2.2 10*3/uL (ref 0.7–4.0)
Monocytes Relative: 4 % (ref 3–12)
Neutrophils Relative %: 64 % (ref 43–77)

## 2011-06-21 LAB — APTT: aPTT: 25 seconds (ref 24–37)

## 2011-06-21 LAB — CBC
Hemoglobin: 9.8 g/dL — ABNORMAL LOW (ref 12.0–15.0)
MCHC: 33.9 g/dL (ref 30.0–36.0)
MCV: 82.9 fL (ref 78.0–100.0)
RBC: 3.5 MIL/uL — ABNORMAL LOW (ref 3.87–5.11)
RDW: 16.2 % — ABNORMAL HIGH (ref 11.5–15.5)

## 2011-06-21 LAB — LIPASE, BLOOD: Lipase: 22 U/L (ref 11–59)

## 2011-07-01 LAB — BASIC METABOLIC PANEL
CO2: 21
Calcium: 9.3
Creatinine, Ser: 1.11
Glucose, Bld: 135 — ABNORMAL HIGH

## 2011-07-01 LAB — CBC
MCHC: 34.1
Platelets: 275
RDW: 16.6 — ABNORMAL HIGH

## 2011-07-01 LAB — URINALYSIS, ROUTINE W REFLEX MICROSCOPIC
Nitrite: NEGATIVE
Specific Gravity, Urine: 1.012
Urobilinogen, UA: 0.2
pH: 5.5

## 2011-07-01 LAB — DIFFERENTIAL
Basophils Absolute: 0
Basophils Relative: 0
Neutro Abs: 5.8
Neutrophils Relative %: 83 — ABNORMAL HIGH

## 2011-07-01 LAB — URINE MICROSCOPIC-ADD ON

## 2011-07-02 LAB — DIFFERENTIAL
Basophils Relative: 1
Eosinophils Absolute: 0.1
Eosinophils Relative: 2
Monocytes Absolute: 0.4
Monocytes Relative: 6
Neutrophils Relative %: 45

## 2011-07-02 LAB — COMPREHENSIVE METABOLIC PANEL
ALT: 9
Alkaline Phosphatase: 76
Glucose, Bld: 92
Potassium: 3.8
Sodium: 138
Total Protein: 6.5

## 2011-07-02 LAB — CBC
Hemoglobin: 10.8 — ABNORMAL LOW
RBC: 3.87
RDW: 17.3 — ABNORMAL HIGH

## 2011-08-06 IMAGING — CR DG CHEST 2V
2 series · 2 of 2 positions shown · non-contrast
Comparison: 03/31/2009

CLINICAL DATA: Preop

CHEST - 2 VIEW

[view not recorded (1 of 2)]
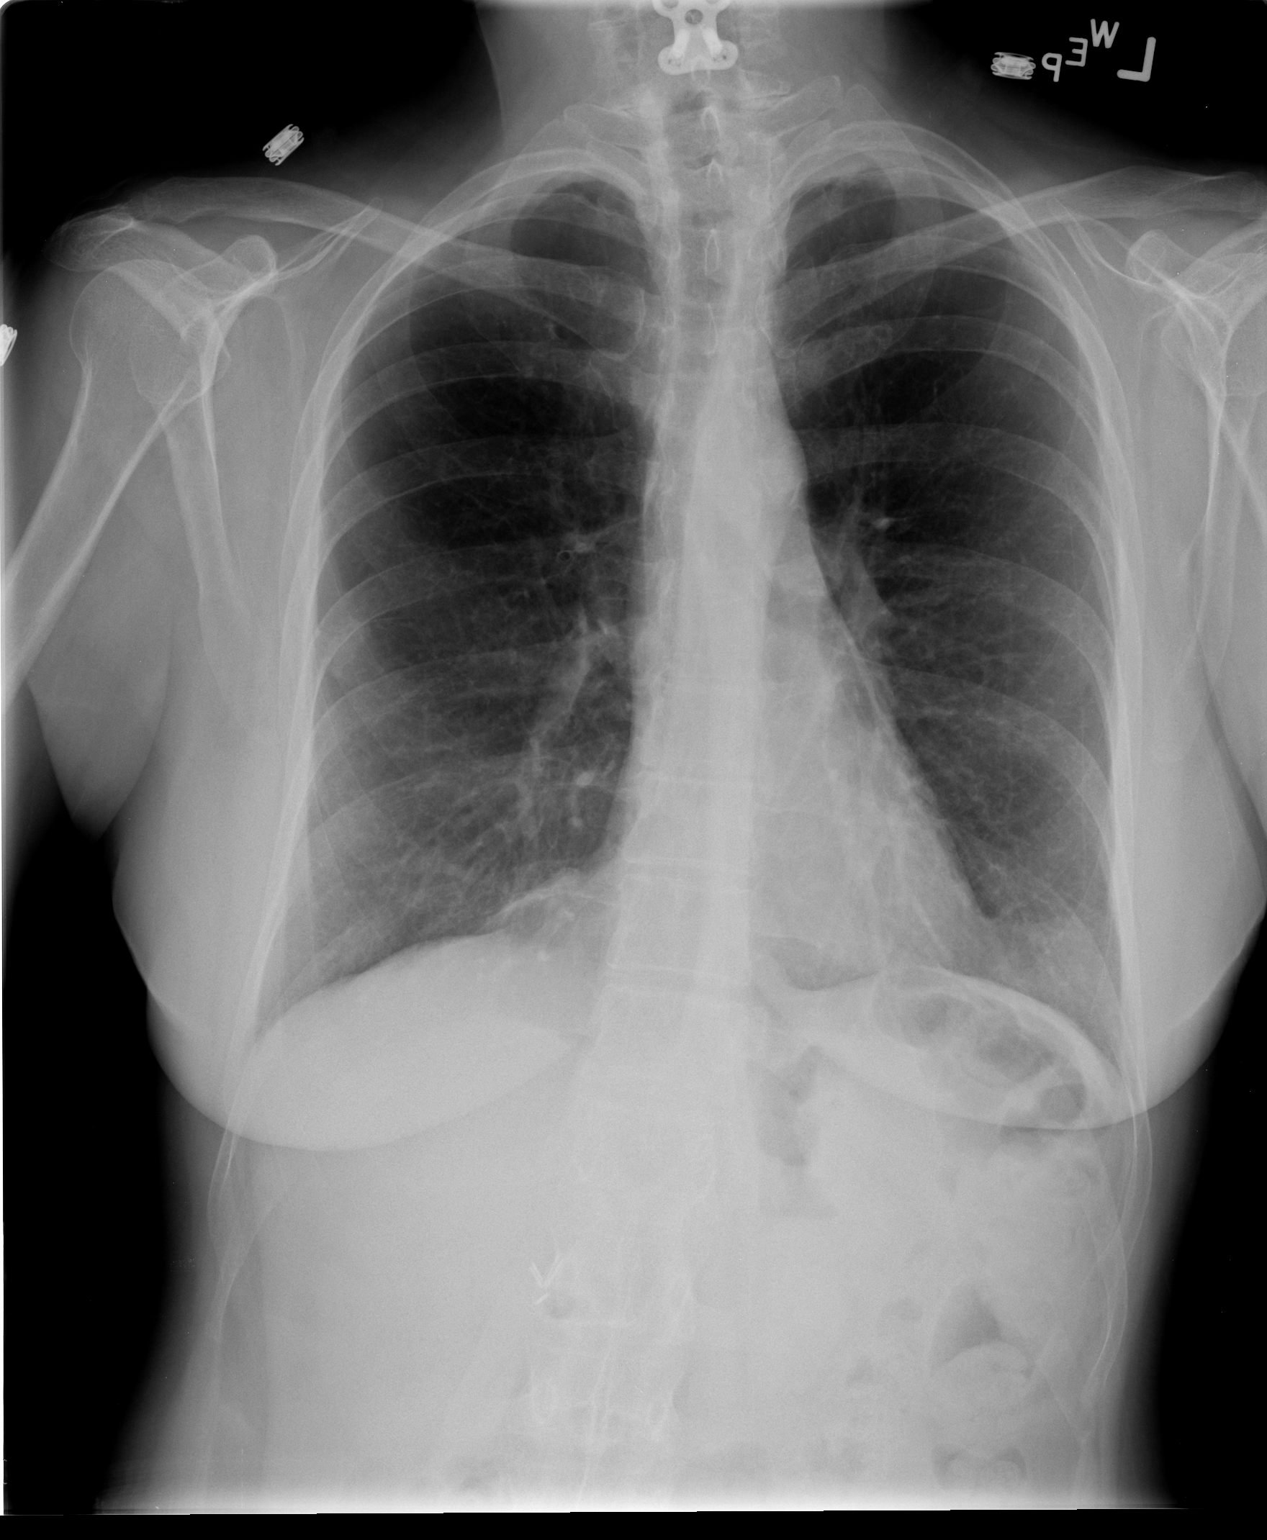

[view not recorded (2 of 2)]
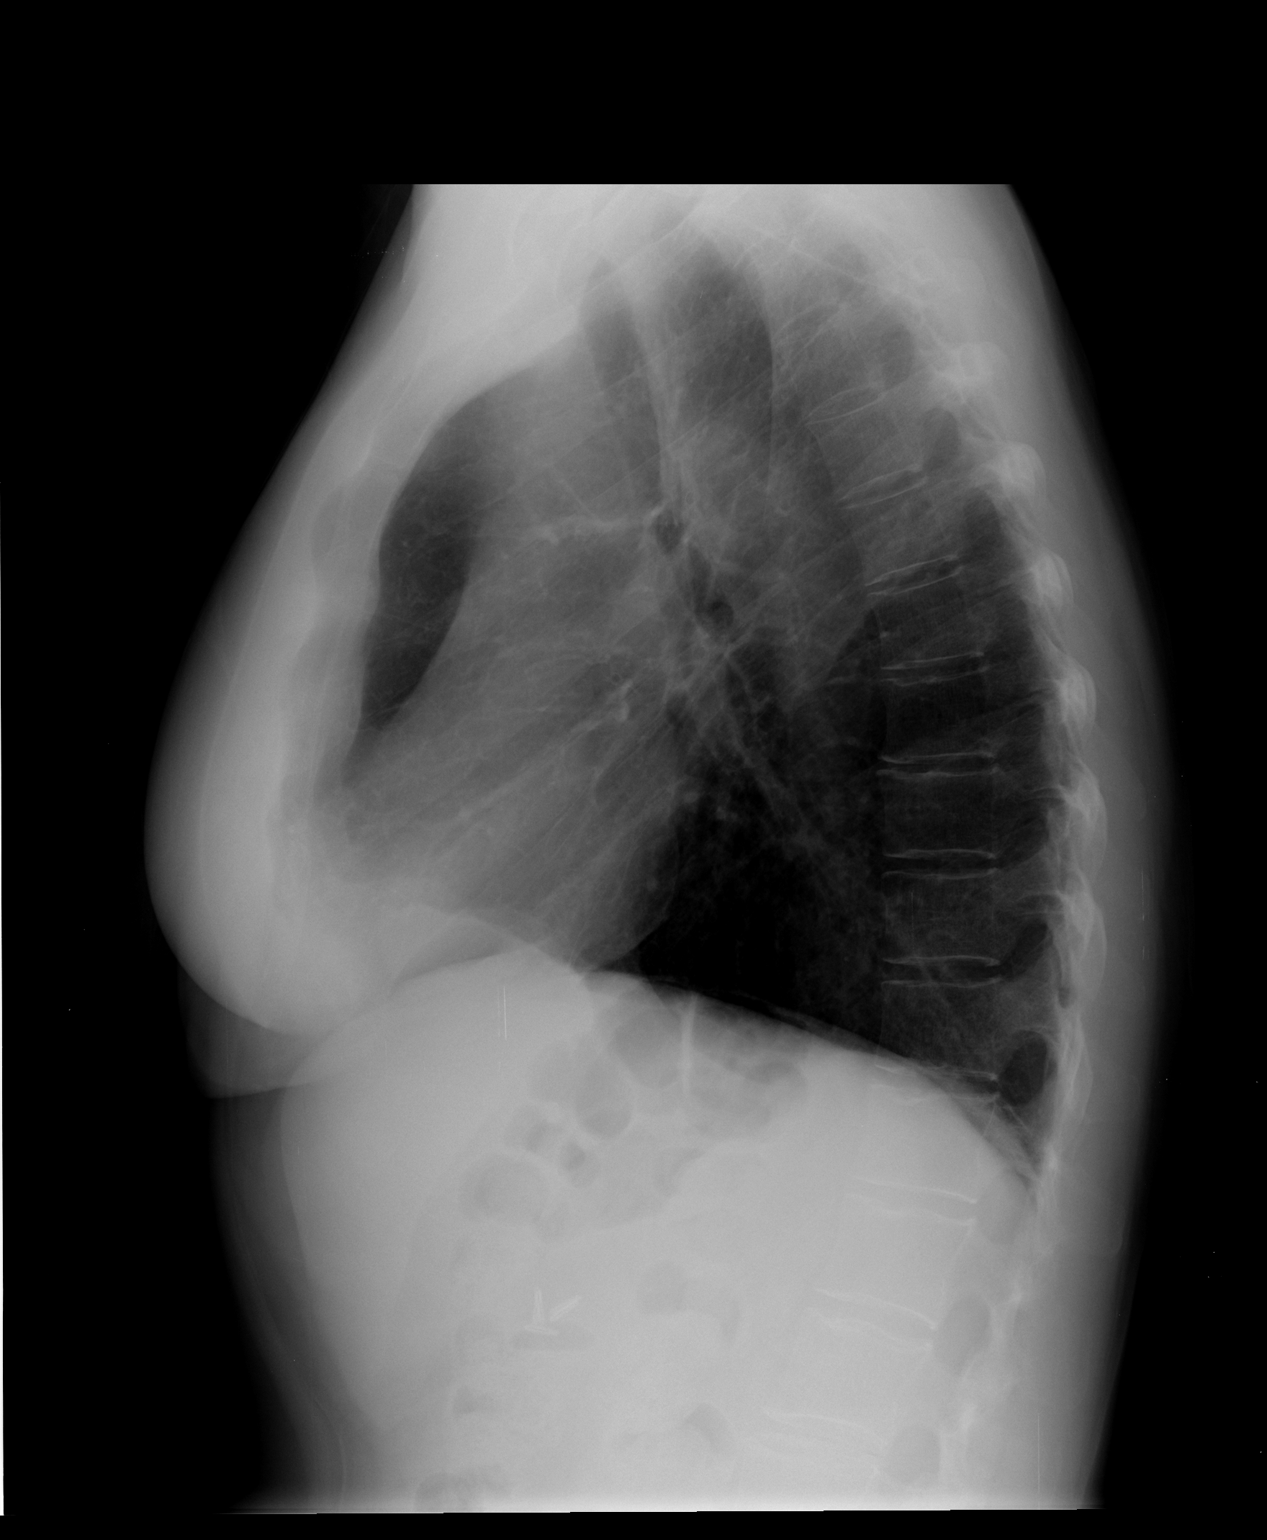

[2 of 2 positions shown; findings below may reference images not displayed]

FINDINGS: Cardiomediastinal silhouette is stable.  No acute
infiltrate or pleural effusion.  No pulmonary edema.
IMPRESSION: No active disease.

## 2011-12-02 IMAGING — CR DG ABDOMEN 2V
2 series · 2 of 2 positions shown · non-contrast
Comparison: 08/31/2008

CLINICAL DATA: Abdominal pain.  Fever.  Nausea and vomiting.
Dizziness.

ABDOMEN - 2 VIEW

[w abdomen upright *]
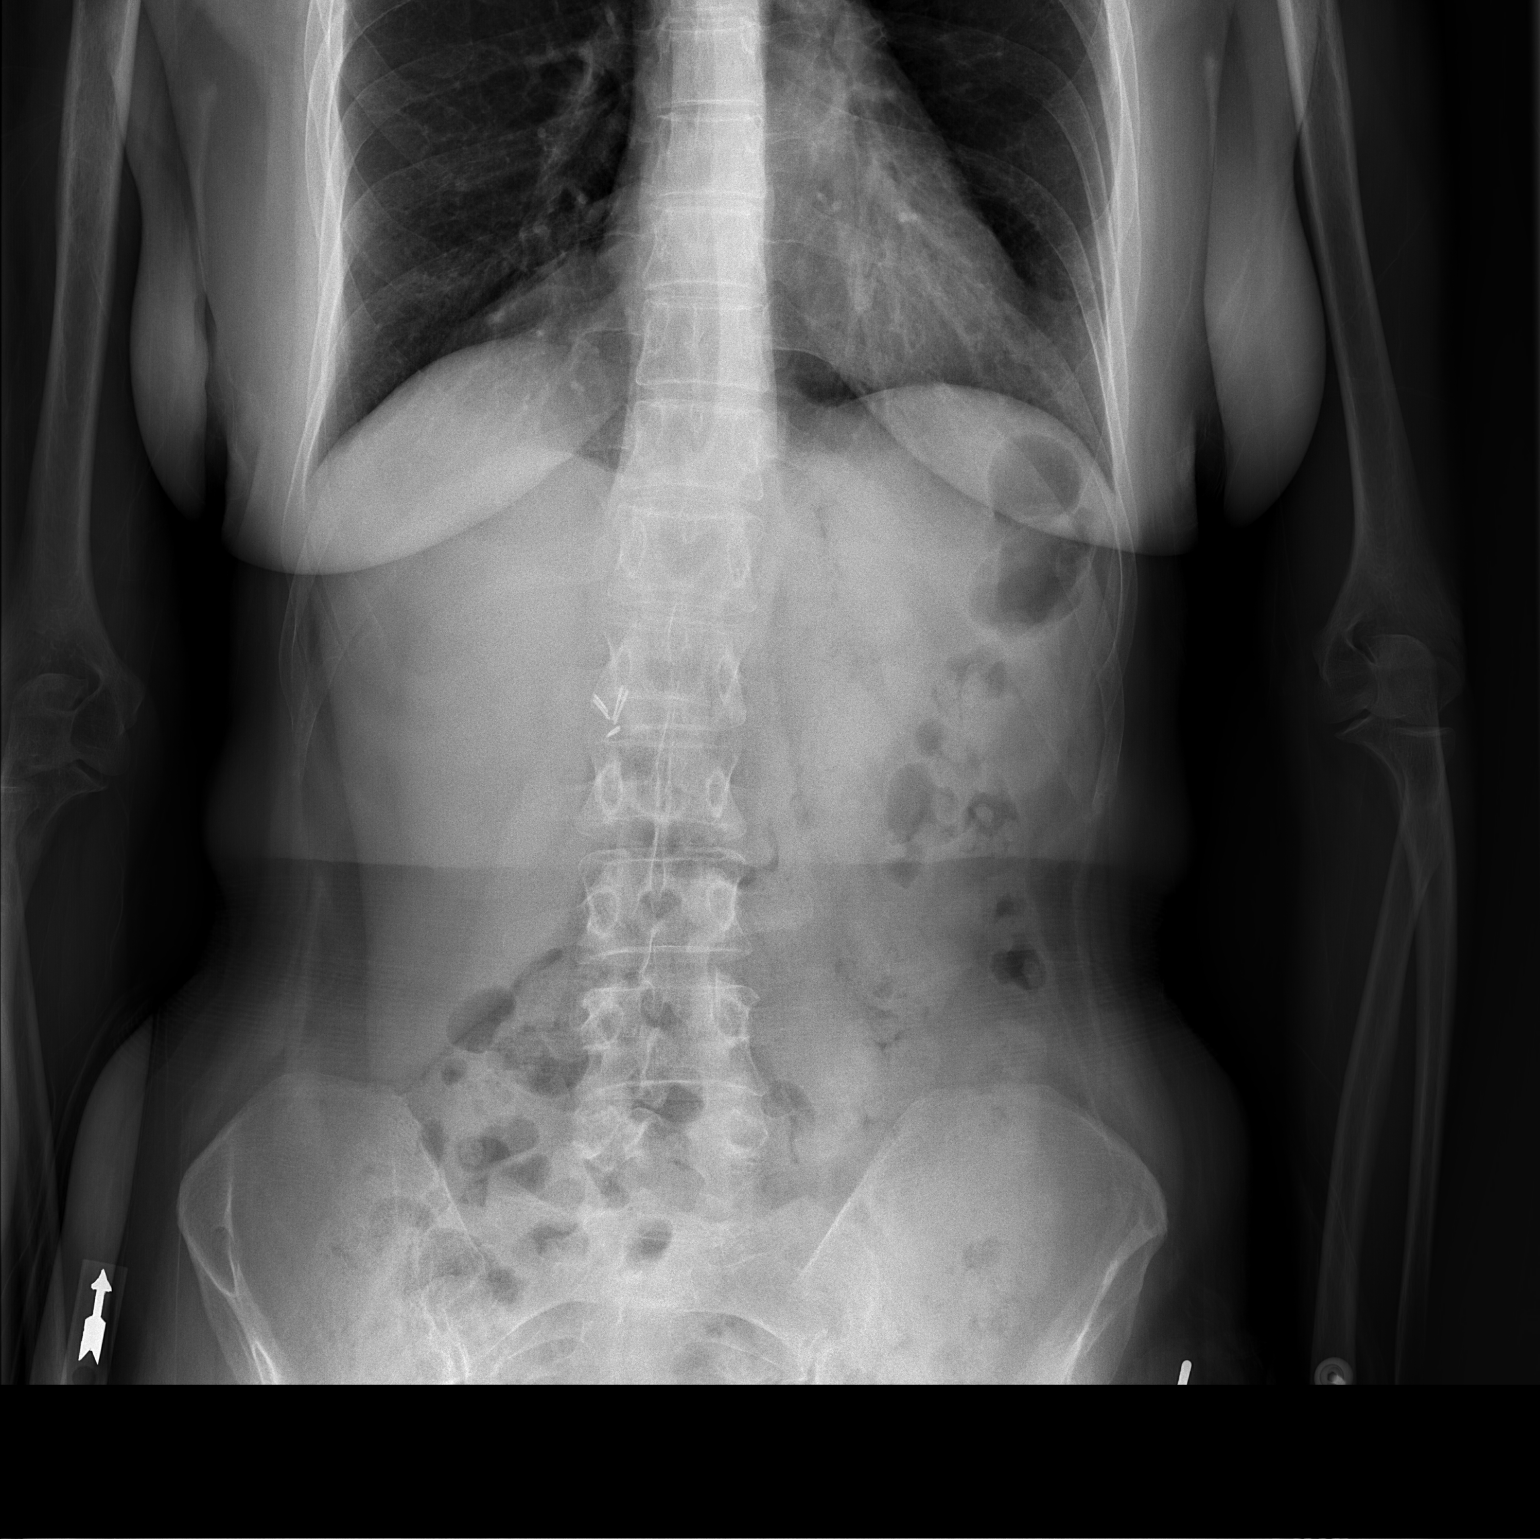

[t abdomen supine]
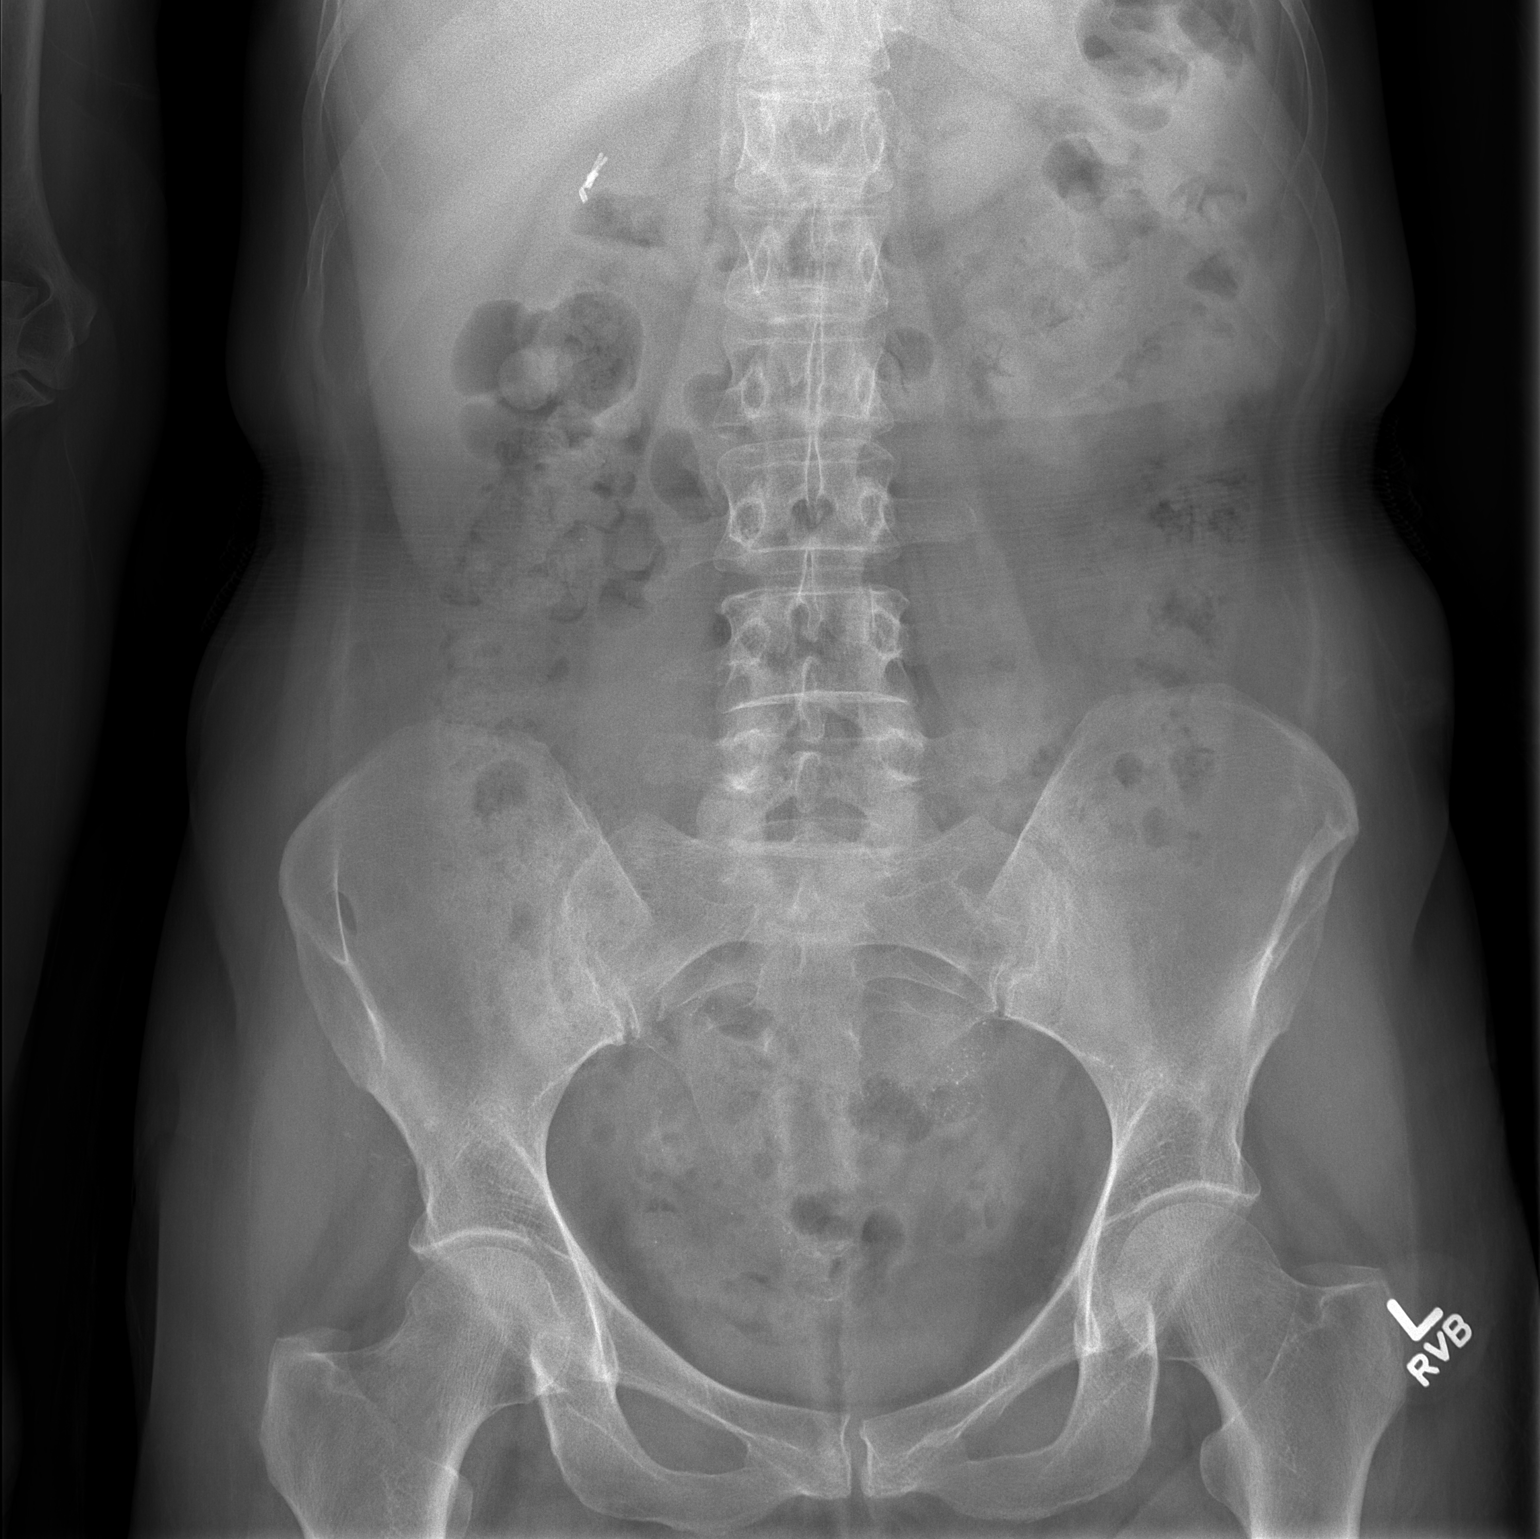

[2 of 2 positions shown; findings below may reference images not displayed]

FINDINGS: Cholecystectomy clips noted.  The bowel gas pattern
appears unremarkable although there are some speckled densities
over the distal colon which may reflect dissolved pill fragments.
No dilated bowel noted.  No significant abnormal air-fluid levels.
IMPRESSION: 1.  No significant abnormality identified.

## 2011-12-02 IMAGING — CR DG CHEST 2V
2 series · 2 of 2 positions shown · non-contrast
Comparison: 07/23/2009

CLINICAL DATA: Abdominal pain.  Nausea and vomiting.  Fevers.
Dizziness.

CHEST - 2 VIEW

[w chest pa]
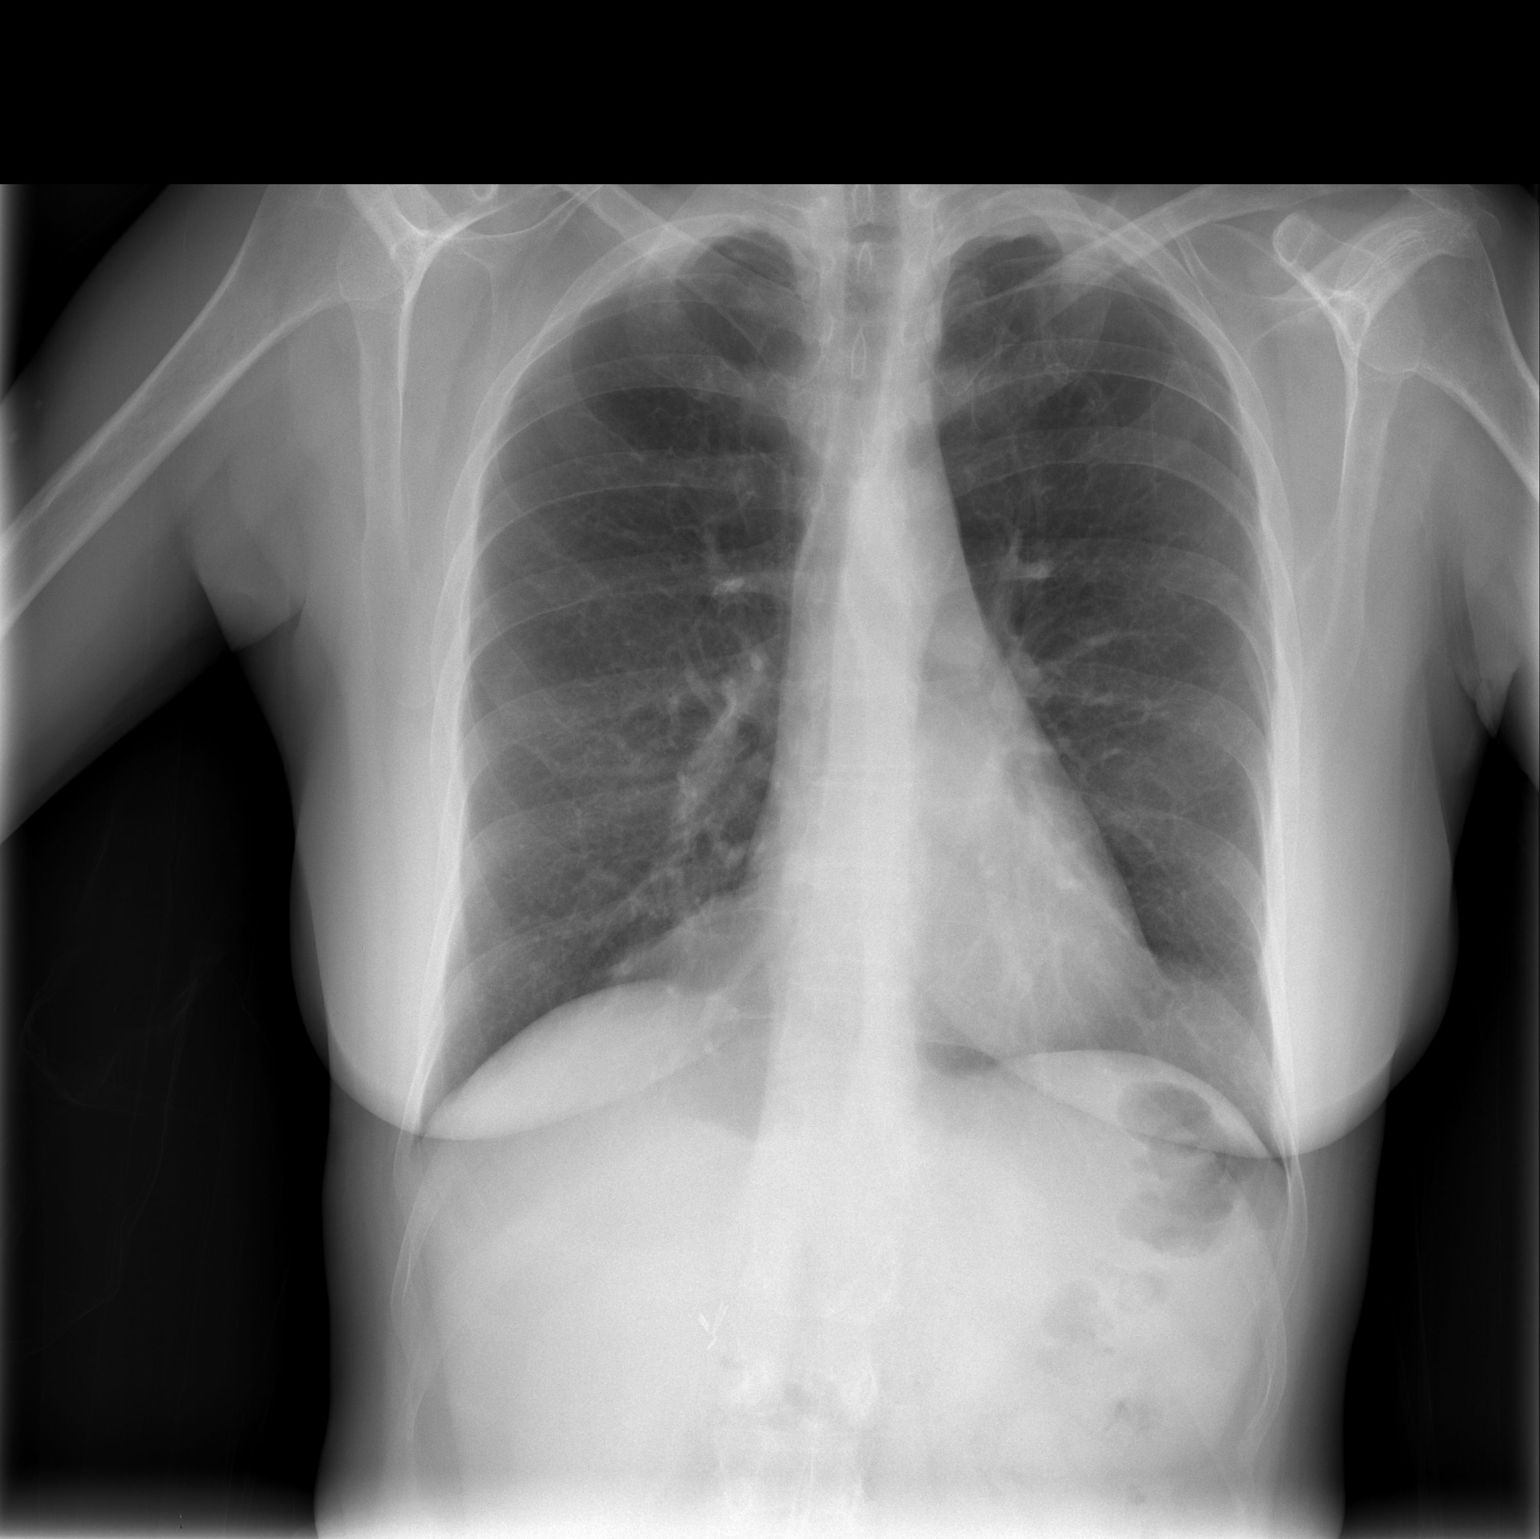

[w chest lat]
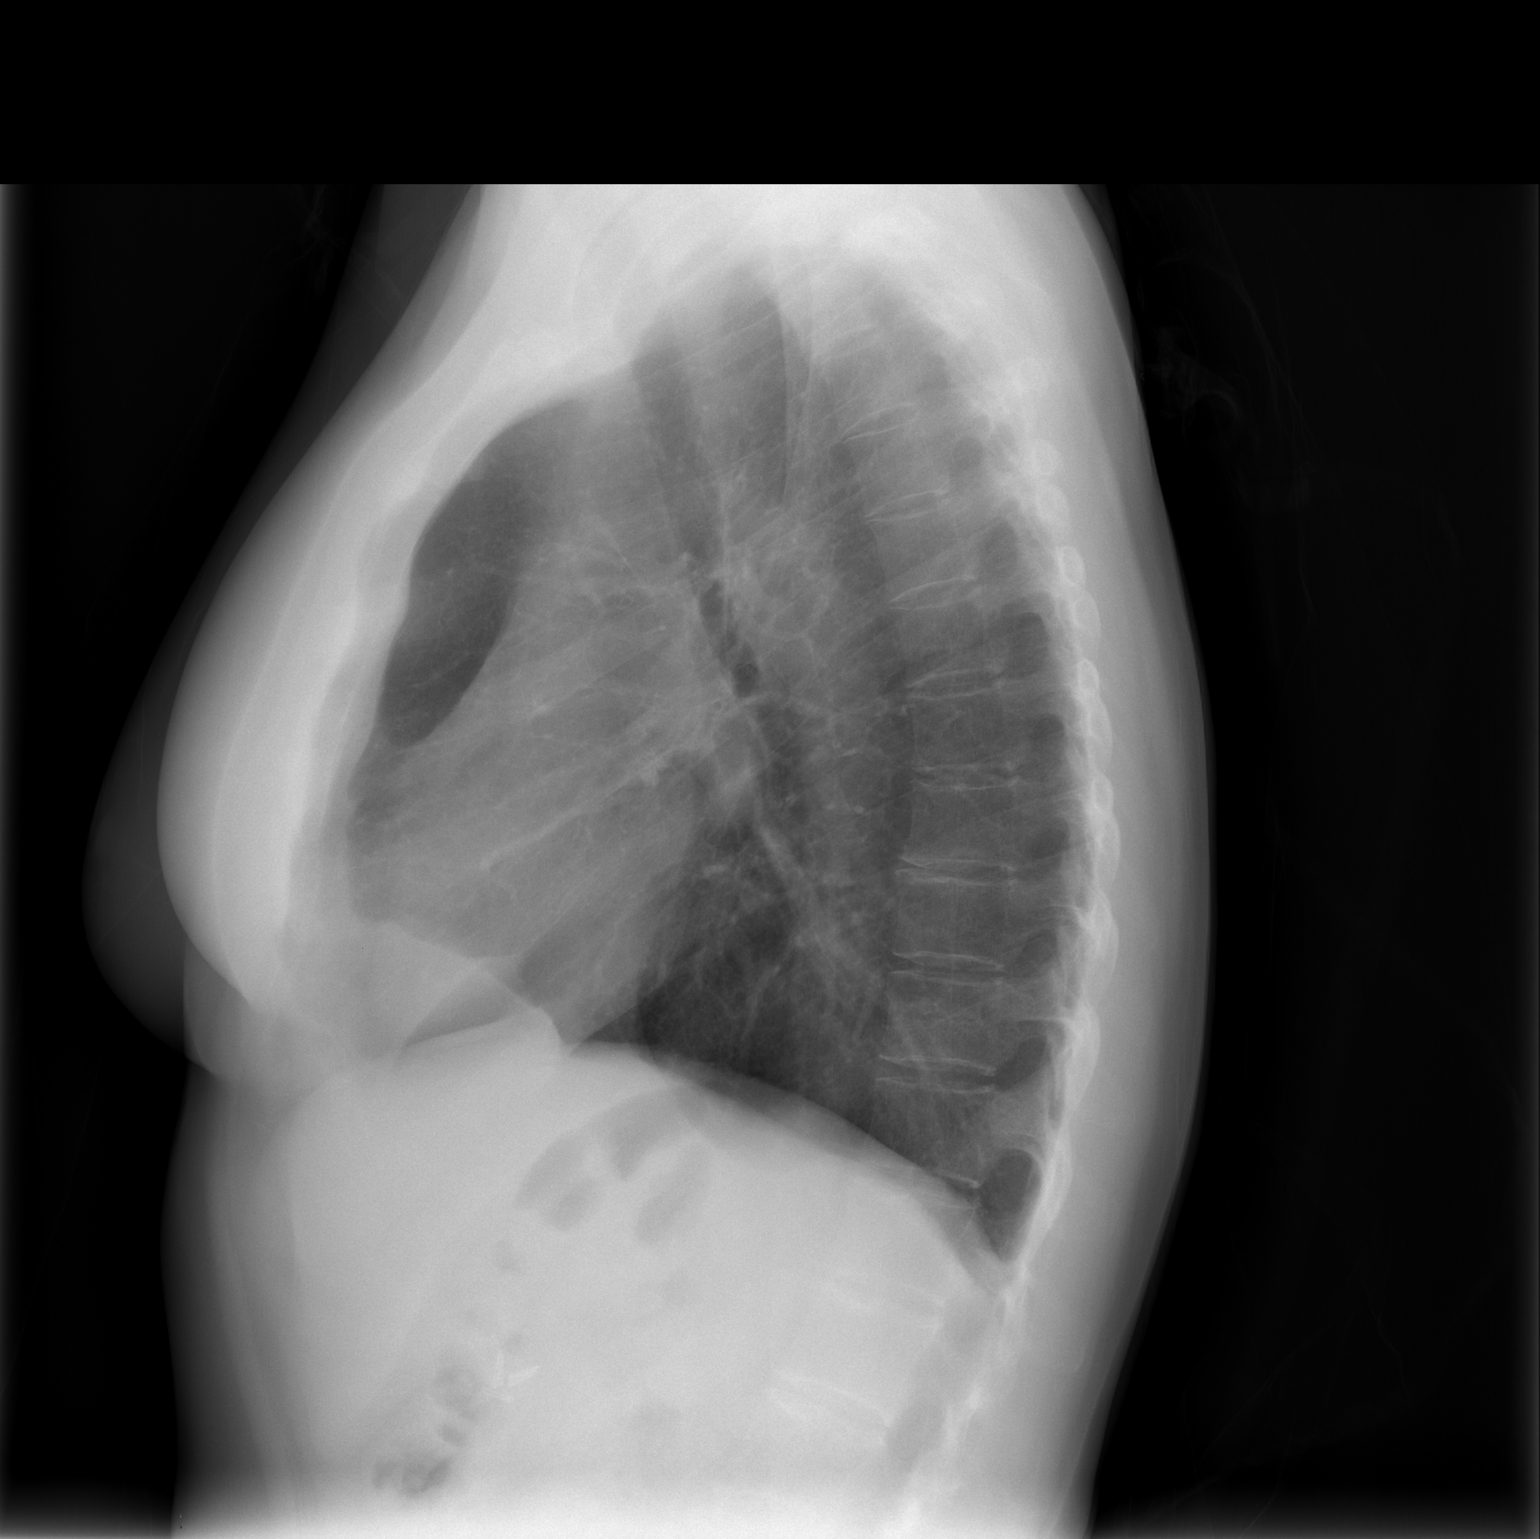

[2 of 2 positions shown; findings below may reference images not displayed]

FINDINGS: Attenuated peripheral pulmonary vasculature is compatible
with emphysema/COPD.  There appears to be a pleural reflection line
of the left lung apex suspicious for a 10% left apical
pneumothorax. No mediastinal shift to suggest tension pneumothorax
at this time.

Cardiac and mediastinal contours appear unremarkable.

No pleural effusion identified.
IMPRESSION: 1.  Suspected 10% left apical pneumothorax.  Consider end-
expiration view for confirmation.
2.  COPD.

Critical test results telephoned to Dr. Alia Tiger at the time
of interpretation on 10/08/2009 at [DATE] p.m.

## 2012-02-17 ENCOUNTER — Encounter: Payer: Self-pay | Admitting: Gastroenterology

## 2012-02-22 ENCOUNTER — Emergency Department (HOSPITAL_BASED_OUTPATIENT_CLINIC_OR_DEPARTMENT_OTHER)
Admission: EM | Admit: 2012-02-22 | Discharge: 2012-02-22 | Disposition: A | Discharge status: Home / Self Care | Payer: PRIVATE HEALTH INSURANCE | Attending: Emergency Medicine | Admitting: Emergency Medicine

## 2012-02-22 ENCOUNTER — Encounter (HOSPITAL_BASED_OUTPATIENT_CLINIC_OR_DEPARTMENT_OTHER): Payer: Self-pay | Admitting: Emergency Medicine

## 2012-02-22 ENCOUNTER — Emergency Department (HOSPITAL_BASED_OUTPATIENT_CLINIC_OR_DEPARTMENT_OTHER): Payer: PRIVATE HEALTH INSURANCE

## 2012-02-22 DIAGNOSIS — Z79899 Other long term (current) drug therapy: Secondary | ICD-10-CM | POA: Insufficient documentation

## 2012-02-22 DIAGNOSIS — R071 Chest pain on breathing: Secondary | ICD-10-CM | POA: Insufficient documentation

## 2012-02-22 DIAGNOSIS — R0789 Other chest pain: Secondary | ICD-10-CM

## 2012-02-22 DIAGNOSIS — I252 Old myocardial infarction: Secondary | ICD-10-CM | POA: Insufficient documentation

## 2012-02-22 DIAGNOSIS — J4489 Other specified chronic obstructive pulmonary disease: Secondary | ICD-10-CM | POA: Insufficient documentation

## 2012-02-22 DIAGNOSIS — F17200 Nicotine dependence, unspecified, uncomplicated: Secondary | ICD-10-CM | POA: Insufficient documentation

## 2012-02-22 DIAGNOSIS — J449 Chronic obstructive pulmonary disease, unspecified: Secondary | ICD-10-CM | POA: Insufficient documentation

## 2012-02-22 HISTORY — DX: Unspecified asthma, uncomplicated: J45.909

## 2012-02-22 HISTORY — DX: Acute myocardial infarction, unspecified: I21.9

## 2012-02-22 LAB — TROPONIN I: Troponin I: <0.3 ng/mL (ref <0.30)

## 2012-02-22 LAB — BASIC METABOLIC PANEL
CO2: 23 mEq/L (ref 19–32)
Glucose, Bld: 103 mg/dL — ABNORMAL HIGH (ref 70–99)
Potassium: 3.9 mEq/L (ref 3.5–5.1)
Sodium: 140 mEq/L (ref 135–145)

## 2012-02-22 LAB — CBC
MCV: 90.2 fL (ref 78.0–100.0)
Platelets: 297 10*3/uL (ref 150–400)
RBC: 4.1 MIL/uL (ref 3.87–5.11)
WBC: 8.7 10*3/uL (ref 4.0–10.5)

## 2012-02-22 LAB — D-DIMER, QUANTITATIVE: D-Dimer, Quant: <0.22 ug/mL-FEU (ref 0.00–0.48)

## 2012-02-22 LAB — DIFFERENTIAL
Lymphocytes Relative: 29 % (ref 12–46)
Lymphs Abs: 2.5 10*3/uL (ref 0.7–4.0)
Neutrophils Relative %: 62 % (ref 43–77)

## 2012-02-22 MED ORDER — OXYCODONE-ACETAMINOPHEN 5-325 MG PO TABS
2.0000 | ORAL_TABLET | Freq: Once | ORAL | Status: AC
  Administered 2012-02-22: 2 via ORAL
  Filled 2012-02-22: qty 2

## 2012-02-22 NOTE — ED Provider Notes (Signed)
History     CSN: [id number]  Arrival date & time 02/22/12  1701   First MD Initiated Contact with Patient 02/22/12 1704      Chief Complaint  Patient presents with  . Chest Pain    (Consider location/radiation/quality/duration/timing/severity/associated sxs/prior treatment) Patient is a 56 y.o. female presenting with chest pain. The history is provided by the patient. No language interpreter was used.  Chest Pain The chest pain began 5 - 7 days ago. Duration of episode(s) is 6 days. Chest pain occurs constantly. The chest pain is worsening. The pain is associated with breathing and coughing. At its most intense, the pain is at 8/10. The pain is currently at 6/10. The severity of the pain is severe. The quality of the pain is described as aching, sharp, similar to previous episodes and tightness. The pain radiates to the right arm. Chest pain is worsened by certain positions and deep breathing. Primary symptoms include cough. She tried nothing for the symptoms. Risk factors include no known risk factors.  Her past medical history is significant for CAD.  Her family medical history is significant for CAD in family.   Pt complains of pain in her chest.   Pt reports she saw Dr. Mayford Knife on Monday and he gave her a prescription for an antibiotic.   Pt reports she got it filled on [facility] but has not taking a dosage yet.  Pt called nurse line and was told to take an aspirin and come to ED.  Pt reports she was told that she had a mild heart attack in the past. Patient reports they wanted to do a cardiac catheterization however she refused. Patient reports that she does not want to have a cardiac catheterization  for evaluation. Patient reports she tried nitroglycerin at home for this pain without any relief. Patient thinks the pain is from the lung infection that was diagnosed by Dr. Mayford Knife on Monday  Past Medical History  Diagnosis Date  . COPD (chronic obstructive pulmonary disease)   . Acute MI    . Asthma   . Chronic bronchitis     Past Surgical History  Procedure Date  . Abdominal hysterectomy   . Neck surgery   . Cholecystectomy     No family history on file.  History  Substance Use Topics  . Smoking status: Current Everyday Smoker -- 0.5 packs/day  . Smokeless tobacco: Not on file  . Alcohol Use: No    OB History    Grav Para Term Preterm Abortions TAB SAB Ect Mult Living                  Review of Systems  Respiratory: Positive for cough.   Cardiovascular: Positive for chest pain.  All other systems reviewed and are negative.    Allergies  Codeine; Hydrocodone-acetaminophen; and Propoxyphene-acetaminophen  Home Medications   Current Outpatient Rx  Name Route Sig Dispense Refill  . METOCLOPRAMIDE HCL 5 MG PO TABS  Take one tablet by mouth three times a day before meals, may take at bedtime if needed.....MUST HAVE OFFICE VISIT 120 tablet 0    BP 120/69  Pulse 84  Temp(Src) 97.6 F (36.4 C) (Oral)  Resp 18  Ht 5\' 4"  (1.626 m)  Wt 115 lb (52.164 kg)  BMI 19.74 kg/m2  SpO2 100%  Physical Exam  Nursing note and vitals reviewed. Constitutional: She is oriented to person, place, and time. She appears well-developed and well-nourished.  HENT:  Head: Normocephalic and atraumatic.  Right Ear: External ear normal.  Left Ear: External ear normal.  Mouth/Throat: Oropharynx is clear and moist.  Eyes: Conjunctivae and EOM are normal. Pupils are equal, round, and reactive to light.  Neck: Normal range of motion. Neck supple.  Cardiovascular: Normal rate, regular rhythm and normal heart sounds.   Pulmonary/Chest: Effort normal and breath sounds normal.  Abdominal: Soft. Bowel sounds are normal.  Musculoskeletal: Normal range of motion.  Neurological: She is alert and oriented to person, place, and time.  Skin: Skin is warm.  Psychiatric: She has a normal mood and affect.    ED Course  Procedures (including critical care time)  Labs Reviewed - No  data to display No results found.   1. Chest wall pain    Date: 02/23/2012  Rate: 84  Rhythm: normal sinus rhythm  QRS Axis: normal  Intervals: normal  ST/T Wave abnormalities: normal  Conduction Disutrbances:none  Narrative Interpretation:   Old EKG Reviewed: unchanged     MDM      I doubt cardiac pain,  Troponin negative x 2,  ddimer negative.   I think pain is muscular.   I advise pt to follow up with Dr. Mayford Knife for recheck in 3-4 days.    Lonia Skinner [place], [place] 02/23/12 1640  Elson Areas, [place] 02/23/12 [phone]

## 2012-02-22 NOTE — ED Notes (Addendum)
Pt to ED via EMS w/ c/o CP- saw PMD Monday for same; ASA PTA (pt unsure what dose), NTG x 1 by EMS, no relief

## 2012-02-22 NOTE — Discharge Instructions (Signed)
Chest Pain (Nonspecific) It is often hard to give a specific diagnosis for the cause of chest pain. There is always a chance that your pain could be related to something serious, such as a heart attack or a blood clot in the lungs. You need to follow up with your caregiver for further evaluation. CAUSES   Heartburn.   Pneumonia or bronchitis.   Anxiety or stress.   Inflammation around your heart (pericarditis) or lung (pleuritis or pleurisy).   A blood clot in the lung.   A collapsed lung (pneumothorax). It can develop suddenly on its own (spontaneous pneumothorax) or from injury (trauma) to the chest.   Shingles infection (herpes zoster virus).  The chest wall is composed of bones, muscles, and cartilage. Any of these can be the source of the pain.  The bones can be bruised by injury.   The muscles or cartilage can be strained by coughing or overwork.   The cartilage can be affected by inflammation and become sore (costochondritis).  DIAGNOSIS  Lab tests or other studies, such as X-rays, electrocardiography, stress testing, or cardiac imaging, may be needed to find the cause of your pain.  TREATMENT   Treatment depends on what may be causing your chest pain. Treatment may include:   Acid blockers for heartburn.   Anti-inflammatory medicine.   Pain medicine for inflammatory conditions.   Antibiotics if an infection is present.   You may be advised to change lifestyle habits. This includes stopping smoking and avoiding alcohol, caffeine, and chocolate.   You may be advised to keep your head raised (elevated) when sleeping. This reduces the chance of acid going backward from your stomach into your esophagus.   Most of the time, nonspecific chest pain will improve within 2 to 3 days with rest and mild pain medicine.  HOME CARE INSTRUCTIONS   If antibiotics were prescribed, take your antibiotics as directed. Finish them even if you start to feel better.   For the next few  days, avoid physical activities that bring on chest pain. Continue physical activities as directed.   Do not smoke.   Avoid drinking alcohol.   Only take over-the-counter or prescription medicine for pain, discomfort, or fever as directed by your caregiver.   Follow your caregiver's suggestions for further testing if your chest pain does not go away.   Keep any follow-up appointments you made. If you do not go to an appointment, you could develop lasting (chronic) problems with pain. If there is any problem keeping an appointment, you must call to reschedule.  SEEK MEDICAL CARE IF:   You think you are having problems from the medicine you are taking. Read your medicine instructions carefully.   Your chest pain does not go away, even after treatment.   You develop a rash with blisters on your chest.  SEEK IMMEDIATE MEDICAL CARE IF:   You have increased chest pain or pain that spreads to your arm, neck, jaw, back, or abdomen.   You develop shortness of breath, an increasing cough, or you are coughing up blood.   You have severe back or abdominal pain, feel nauseous, or vomit.   You develop severe weakness, fainting, or chills.   You have a fever.  THIS IS AN EMERGENCY. Do not wait to see if the pain will go away. Get medical help at once. Call your local emergency services (911 in [place] Do not drive yourself to the hospital. MAKE SURE YOU:   Understand these instructions.     Will watch your condition.   Will get help right away if you are not doing well or get worse.  Document Released: 06/12/2005 Document Revised: 08/22/2011 Document Reviewed: 04/07/2008 [employer] Patient Information 2012 [employer]. 

## 2012-02-22 NOTE — ED Notes (Signed)
Pt talking on cell phone upon arrival to ED, requests TV on during assessment & asks, "how do you change the channel?"

## 2012-02-25 NOTE — ED Provider Notes (Signed)
Medical screening examination/treatment/procedure(s) were performed by non-physician practitioner and as supervising physician I was immediately available for consultation/collaboration.  Dick Hark, MD 02/25/12 1205 

## 2012-02-28 ENCOUNTER — Encounter: Payer: Self-pay | Admitting: *Deleted

## 2012-03-01 IMAGING — CR DG SCAPULA*L*
2 series · 2 of 2 positions shown · non-contrast
Comparison: None.

CLINICAL DATA: Fell with scapular pain

LEFT SCAPULA - 2+ VIEWS

[w scapula ap/pa left]
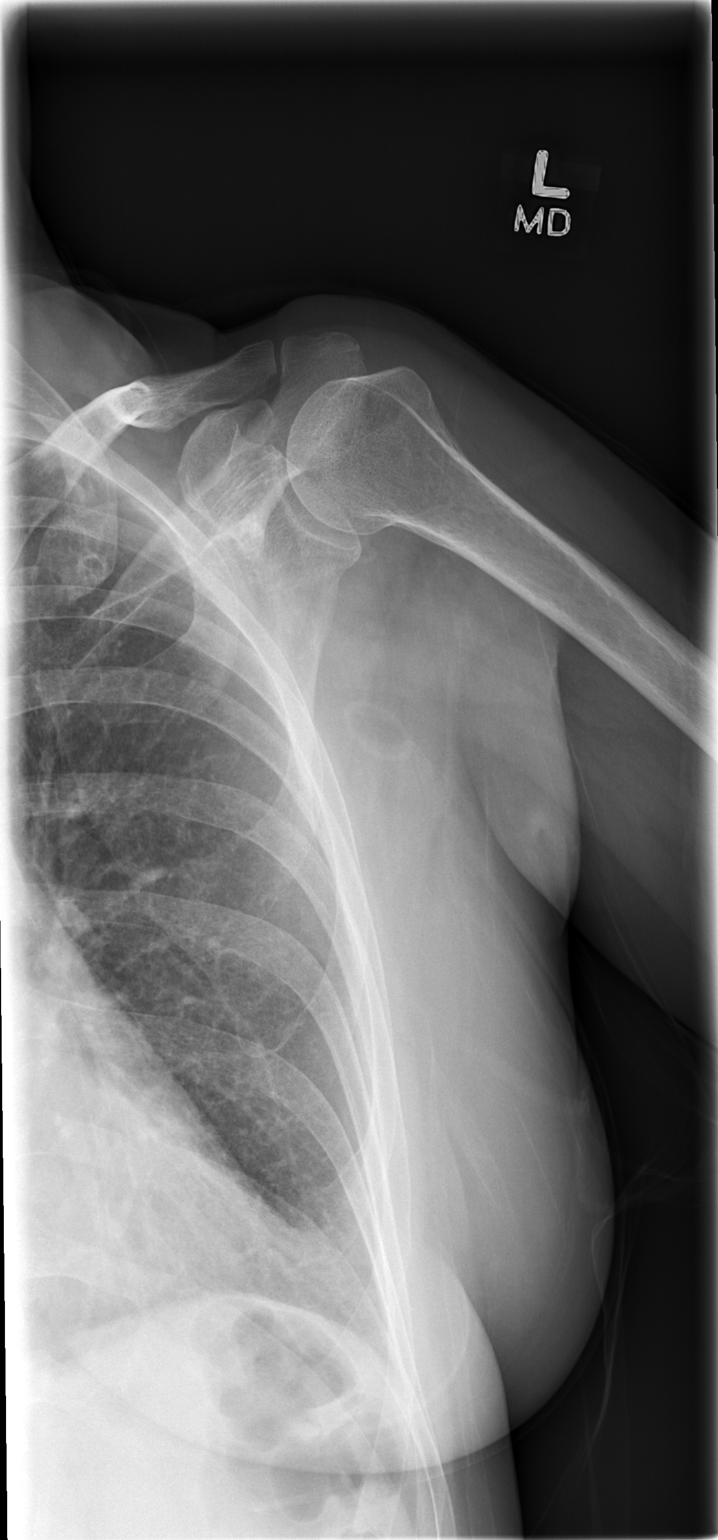

[w scapula lat left]
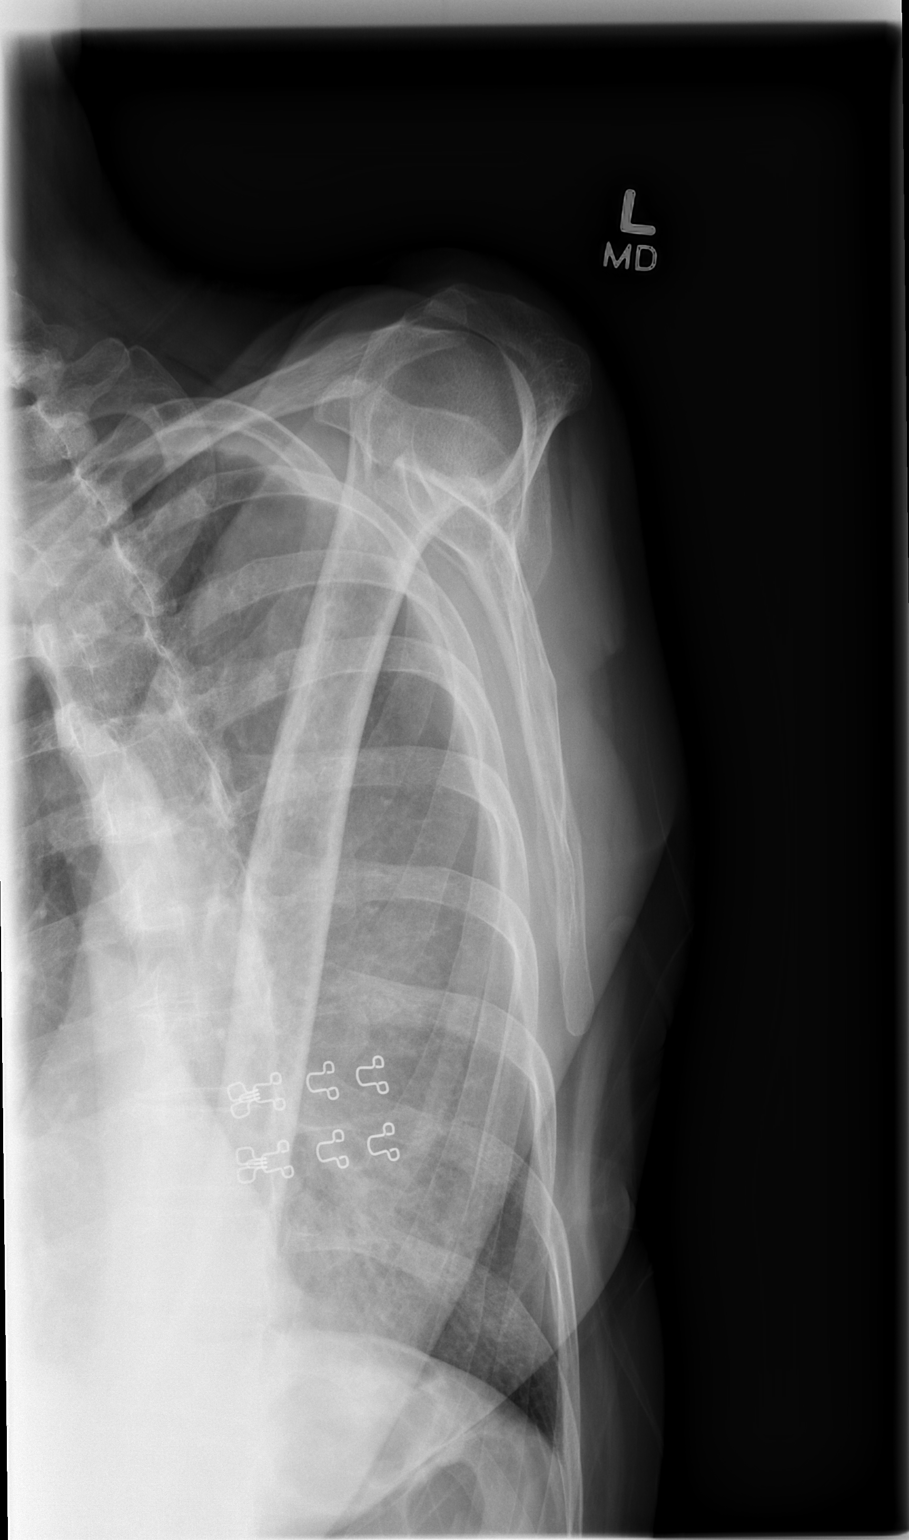

[2 of 2 positions shown; findings below may reference images not displayed]

FINDINGS: No fracture of the scapula is evident.  The left AC joint
is normally aligned in the left humeral head appears to be a normal
position.
IMPRESSION: Negative.

## 2012-03-04 ENCOUNTER — Encounter: Payer: Self-pay | Admitting: *Deleted

## 2012-03-06 ENCOUNTER — Encounter: Payer: Self-pay | Admitting: Gastroenterology

## 2012-03-06 ENCOUNTER — Ambulatory Visit (INDEPENDENT_AMBULATORY_CARE_PROVIDER_SITE_OTHER): Payer: PRIVATE HEALTH INSURANCE | Admitting: Gastroenterology

## 2012-03-06 VITALS — BP 100/70 | HR 70 | Ht 64.0 in | Wt 115.8 lb

## 2012-03-06 DIAGNOSIS — K6389 Other specified diseases of intestine: Secondary | ICD-10-CM

## 2012-03-06 DIAGNOSIS — Z9089 Acquired absence of other organs: Secondary | ICD-10-CM

## 2012-03-06 DIAGNOSIS — F1120 Opioid dependence, uncomplicated: Secondary | ICD-10-CM

## 2012-03-06 DIAGNOSIS — Z9049 Acquired absence of other specified parts of digestive tract: Secondary | ICD-10-CM

## 2012-03-06 DIAGNOSIS — F1920 Other psychoactive substance dependence, uncomplicated: Secondary | ICD-10-CM

## 2012-03-06 DIAGNOSIS — J449 Chronic obstructive pulmonary disease, unspecified: Secondary | ICD-10-CM

## 2012-03-06 DIAGNOSIS — G894 Chronic pain syndrome: Secondary | ICD-10-CM

## 2012-03-06 DIAGNOSIS — G8929 Other chronic pain: Secondary | ICD-10-CM

## 2012-03-06 DIAGNOSIS — K3184 Gastroparesis: Secondary | ICD-10-CM

## 2012-03-06 DIAGNOSIS — R197 Diarrhea, unspecified: Secondary | ICD-10-CM

## 2012-03-06 DIAGNOSIS — K521 Toxic gastroenteritis and colitis: Secondary | ICD-10-CM

## 2012-03-06 DIAGNOSIS — R109 Unspecified abdominal pain: Secondary | ICD-10-CM

## 2012-03-06 DIAGNOSIS — T50904A Poisoning by unspecified drugs, medicaments and biological substances, undetermined, initial encounter: Secondary | ICD-10-CM

## 2012-03-06 MED ORDER — ALIGN 4 MG PO CAPS: 1.0000 | ORAL_CAPSULE | Freq: Every day | ORAL | Status: DC

## 2012-03-06 MED ORDER — MOVIPREP 100 G PO SOLR: 1.0000 | Freq: Once | ORAL | Status: DC

## 2012-03-06 MED ORDER — TRAMADOL HCL 50 MG PO TABS: 50.0000 mg | ORAL_TABLET | Freq: Four times a day (QID) | ORAL | Status: AC | PRN

## 2012-03-06 NOTE — Progress Notes (Signed)
History of Present Illness:  This is a very complicated 56 year old Caucasian female who has had multiple GI complaints for over 10 years. She has COPD with periodic prednisone and antibiotic use, multiple surgeries including cholecystectomy, chronic acid reflux, recurrent bacterial overgrowth syndrome, chronic anxiety and depression, and chronic iron deficiency anemia. She has had multiple GI evaluations which have confirmed acid reflux with last endoscopy and colonoscopy in 2007. Evaluation for celiac disease has been negative. Previous radiographs have been unremarkable including a small bowel series. She has been treated in the past for both iron deficiency and B12 deficiency. Gastric emptying scans have confirmed gastroparesis, and because of abdominal bloating, abdominal pain, excessive gas, she was tried on Reglan 5 mg before meals and at bedtime. This has not helped any of her gastrointestinal symptoms but does cause rather marked diarrhea which is nonbloody and not melanotic. She'll or variety of narcotics for chronic pain syndrome including Tylenol #3 inferior are set with codeine, Flexeril, and has been using a large amount of Imodium and antinausea medication. She is followed at the general medical clinic on [address]  Main complaints today are abdominal distention, diarrhea, and did back pain of unexplained etiology. She denies any specific genitourinary complaints or worsening of her chronic COPD. His had no melena, hematochezia, hematemesis, or history of hepatitis or pancreatitis.  I have reviewed this patient's present history, medical and surgical past history, allergies and medications.     ROS: The remainder of the 10 point ROS is negative,,, no side effects from metoclopramide use. His chronic dyspnea on exertion and use a variety of inhalers but is not on home oxygen.     Physical Exam: Chronically ill-appearing patient in no acute distress. Blood pressure 100/70, pulse 70 and  regular, and weight 115 pounds with BMI of 19.88. General well developed well nourished patient in no acute distress, appearing older than her stated age Eyes PERRLA, no icterus, fundoscopic exam per opthamologist Skin no lesions noted Neck supple, no adenopathy, no thyroid enlargement, no tenderness Chest clear to percussion and auscultation, diminished breath sounds in both lung fields. There are scattered wheezes but no rhonchi. Heart no significant murmurs, gallops or rubs noted Abdomen no hepatosplenomegaly masses or tenderness, there is rather marked lower nominal distention, but bowel sounds are nonobstructive. No CVA tenderness or lesions noted on back exam.  Rectal inspection normal no fissures, or fistulae noted.  No masses or tenderness on digital exam. Stool guaiac negative. Extremities no acute joint lesions, edema, phlebitis or evidence of cellulitis. Neurologic patient oriented x 3, cranial nerves intact, no focal neurologic deficits noted. Psychological mental status normal and normal affect.  Assessment and plan: Probable chronic gastrointestinal motility disorder manifested mostly by gastroparesis and abdominal distention with a history of chronic bacterial overgrowth syndrome. I suspect her diarrhea as a side effect from Reglan therapy. I have discontinued her Reglan, prescribed a gastroparesis diet, and we'll treat her with Xifaxan 550 mg twice a day with probiotics for 10 days. She is due for followup endoscopy and colonoscopy which has been scheduled at her convenience. I have given her some tramadol 50 mg to take every 6-8 Hours in Pl. of her codeine preparations which are probably exacerbating her gut issues. Her GERD is well controlled on daily Nexium, and she is to continue other medications as per primary care physicians. CBC and metabolic profiles recently have been normal. CT scan of the abdomen in 2009 was unremarkable. Previous evaluations for H. pylori was negative.  Although esophageal manometry was suggested, this is never been completed. Patient is status post cholecystectomy hysterectomy and removal of her ovaries. She is followed by pulmonary physicians also.   Please copy her primary care physician, referring physician, and pertinent subspecialists.  Encounter Diagnoses  Name Primary?  . Chronic abdominal pain Yes  . Drug-induced diarrhea   . Gastroparesis

## 2012-03-06 NOTE — Patient Instructions (Addendum)
You have been scheduled for an endoscopy and colonoscopy with propofol. Please follow the written instructions given to you at your visit today. Please pick up your prep at the pharmacy within the next 1-3 days. Stop taking your Reglan.   Gastroparesis  Gastroparesis is also called slowed stomach emptying (delayed gastric emptying). It is a condition in which the stomach takes too long to empty its contents. It often happens in people with diabetes.  CAUSES  Gastroparesis happens when nerves to the stomach are damaged or stop working. When the nerves are damaged, the muscles of the stomach and intestines do not work normally. The movement of food is slowed or stopped. High blood glucose (sugar) causes changes in nerves and can damage the blood vessels that carry oxygen and nutrients to the nerves. RISK FACTORS  Diabetes.   Post-viral syndromes.   Eating disorders (anorexia, bulimia).   Surgery on the stomach or vagus nerve.   Gastroesophageal reflux disease (rarely).   Smooth muscle disorders (amyloidosis, scleroderma).   Metabolic disorders, including hypothyroidism.   Parkinson's disease.  SYMPTOMS   Heartburn.   Feeling sick to your stomach (nausea).   Vomiting of undigested food.   An early feeling of fullness when eating.   Weight loss.   Abdominal bloating.   Erratic blood glucose levels.   Lack of appetite.   Gastroesophageal reflux.   Spasms of the stomach wall.  Complications can include:  Bacterial overgrowth in stomach. Food stays in the stomach and can ferment and cause bacteria to grow.   Weight loss due to difficulty digesting and absorbing nutrients.   Vomiting.   Obstruction in the stomach. Undigested food can harden and cause nausea and vomiting.   Blood glucose fluctuations caused by inconsistent food absorption.  DIAGNOSIS  The diagnosis of gastroparesis is confirmed through one or more of the following tests:  Barium X-rays and scans.  These tests look at how long it takes for food to move through the stomach.   Gastric manometry. This test measures electrical and muscular activity in the stomach. A thin tube is passed down the throat into the stomach. The tube contains a wire that takes measurements of the stomach's electrical and muscular activity as it digests liquids and solid food.   Endoscopy. This procedure is done with a long, thin tube called an endoscope. It is passed through the mouth and gently guides down the esophagus into the stomach. This tube helps the caregiver look at the lining of the stomach to check for any abnormalities.   Ultrasound. This can rule out gallbladder disease or pancreatitis. This test will outline and define the shape of the gallbladder and pancreas.  TREATMENT   The primary treatment is to identify the problem and help control blood glucose levels. Treatments include:   Exercise.   Medicines to control nausea and vomiting.   Medicines to stimulate stomach muscles.   Changes in what and when you eat.   Having smaller meals more often.   Eating low-fiber forms of high-fiber foods, such aseating cooked vegetables instead of raw vegetables.   Eating low-fat foods.   Consuming liquids, which are easier to digest.   In severe cases, feeding tubes and intravenous (IV) feeding may be needed.  It is important to note that in most cases, treatment does not cure gastroparesis. It is usually a lasting (chronic) condition. Treatment helps you manage the condition so that you can be as healthy and comfortable as possible. NEW TREATMENTS  A gastric  neurostimulator has been developed to assist people with gastroparesis. The battery-operated device is surgically implanted. It emits mild electrical pulses to help improve stomach emptying and to control nausea and vomiting.   The use of botulinum toxin has been shown to improve stomach emptying by decreasing the prolonged contractions of the  muscle between the stomach and the small intestine (pyloric sphincter). The benefits are temporary.  SEEK MEDICAL CARE IF:   You are having problems keeping your blood glucose in goal range.   You are having nausea, vomiting, bloating, or early feelings of fullness with eating.   Your symptoms do not change with a change in diet.  Document Released: 09/02/2005 Document Revised: 08/22/2011 Document Reviewed: 02/09/2009 [facility] Patient Information 2012 [place], [place].    Start taking your Xifaxan samples one tablet by mouth twice daily x 10 days. While taking this please take Align probiotic samples one tablet by mouth once daily.  We have sent the following medications to your pharmacy for you to pick up at your convenience: Tramadol in place of your Codeine medication. cc: Lerry Liner, MD

## 2012-03-17 ENCOUNTER — Telehealth: Payer: Self-pay | Admitting: Gastroenterology

## 2012-03-17 NOTE — Telephone Encounter (Signed)
Patient reports "I have been throwing up my prep and everything I ate today".  I questioned the patient about her diet today, she reports that she has been eating solid food all day.  She is advised that she should not be eating solid food.  Discussed with Dr. Jarold Motto.  Per Dr. Jarold Motto cancel the colonoscopy and change her to EGD only.  He will discuss with her tomorrow at the procedure rescheduling the colonoscopy.  She is advised that she may not have any solid food, clear liquids until 11:00 am tomorrow and arrive at 1:00.  She verbalized understanding of all instructions.

## 2012-03-18 ENCOUNTER — Encounter: Payer: Self-pay | Admitting: Gastroenterology

## 2012-03-18 ENCOUNTER — Ambulatory Visit (AMBULATORY_SURGERY_CENTER): Payer: PRIVATE HEALTH INSURANCE | Admitting: Gastroenterology

## 2012-03-18 VITALS — BP 109/69 | HR 94 | Temp 96.2°F | Resp 29 | Ht 64.0 in | Wt 115.0 lb

## 2012-03-18 DIAGNOSIS — K2990 Gastroduodenitis, unspecified, without bleeding: Secondary | ICD-10-CM

## 2012-03-18 DIAGNOSIS — R197 Diarrhea, unspecified: Secondary | ICD-10-CM

## 2012-03-18 DIAGNOSIS — R112 Nausea with vomiting, unspecified: Secondary | ICD-10-CM

## 2012-03-18 DIAGNOSIS — R109 Unspecified abdominal pain: Secondary | ICD-10-CM

## 2012-03-18 DIAGNOSIS — K3184 Gastroparesis: Secondary | ICD-10-CM

## 2012-03-18 DIAGNOSIS — D1330 Benign neoplasm of unspecified part of small intestine: Secondary | ICD-10-CM

## 2012-03-18 DIAGNOSIS — K2970 Gastritis, unspecified, without bleeding: Secondary | ICD-10-CM

## 2012-03-18 MED ORDER — SODIUM CHLORIDE 0.9 % IV SOLN: 500.0000 mL | INTRAVENOUS | Status: DC

## 2012-03-18 NOTE — Patient Instructions (Addendum)
Per Dr. Jarold Motto follow the dilation diet the rest of the day.  Continue DOMPERIDOME 10 mg 3 x day and stay on the gastroparesis diet.  You may resume your prior medications today.  Please call if any questions or concerns.    YOU HAD AN ENDOSCOPIC PROCEDURE TODAY AT THE [facility]: Refer to the procedure report that was given to you for any specific questions about what was found during the examination.  If the procedure report does not answer your questions, please call your gastroenterologist to clarify.  If you requested that your care partner not be given the details of your procedure findings, then the procedure report has been included in a sealed envelope for you to review at your convenience later.  YOU SHOULD EXPECT: Some feelings of bloating in the abdomen. Passage of more gas than usual.  Walking can help get rid of the air that was put into your GI tract during the procedure and reduce the bloating. If you had a lower endoscopy (such as a colonoscopy or flexible sigmoidoscopy) you may notice spotting of blood in your stool or on the toilet paper. If you underwent a bowel prep for your procedure, then you may not have a normal bowel movement for a few days.  DIET:    Drink plenty of fluids but you should avoid alcoholic beverages for 24 hours.  Please follow the dilatation diet the rest of the day.  ACTIVITY: Your care partner should take you home directly after the procedure.  You should plan to take it easy, moving slowly for the rest of the day.  You can resume normal activity the day after the procedure however you should NOT DRIVE or use heavy machinery for 24 hours (because of the sedation medicines used during the test).    SYMPTOMS TO REPORT IMMEDIATELY: A gastroenterologist can be reached at any hour.  During normal business hours, 8:30 AM to 5:00 PM Monday through Friday, call [phone].  After hours and on weekends, please call the GI answering service at  [phone] who will take a message and have the physician on call contact you.     Following upper endoscopy (EGD)  Vomiting of blood or coffee ground material  New chest pain or pain under the shoulder blades  Painful or persistently difficult swallowing  New shortness of breath  Fever of 100F or higher  Black, tarry-looking stools  FOLLOW UP: If any biopsies were taken you will be contacted by phone or by letter within the next 1-3 weeks.  Call your gastroenterologist if you have not heard about the biopsies in 3 weeks.  Our staff will call the home number listed on your records the next business day following your procedure to check on you and address any questions or concerns that you may have at that time regarding the information given to you following your procedure. This is a courtesy call and so if there is no answer at the home number and we have not heard from you through the emergency physician on call, we will assume that you have returned to your regular daily activities without incident.  SIGNATURES/CONFIDENTIALITY: You and/or your care partner have signed paperwork which will be entered into your electronic medical record.  These signatures attest to the fact that that the information above on your After Visit Summary has been reviewed and is understood.  Full responsibility of the confidentiality of this discharge information lies with you and/or your care-partner.

## 2012-03-18 NOTE — Progress Notes (Signed)
Pt's husband had called for transportation service to pick them up.. They will call the pt's cell phone when at front of the building. Maw

## 2012-03-18 NOTE — Op Note (Signed)
[facility] [address] [place], [place]  [address]  ENDOSCOPY PROCEDURE REPORT  PATIENT:  Lori [name], Lori [name]  MR#:  [id number] BIRTHDATE:  July 19, 1956, 55 yrs. old  GENDER:  female  ENDOSCOPIST:  Vania Rea. Jarold Motto, MD, [facility] Referred by:  PROCEDURE DATE:  03/18/2012 PROCEDURE:  EGD with biopsy, 43239, EGD with biopsy for H. pylori 21308, Maloney Dilation of Esophagus ASA CLASS:  Class III INDICATIONS:  heartburn, nausea and vomiting  MEDICATIONS:   propofol (Diprivan) 250 mg IV TOPICAL ANESTHETIC:  DESCRIPTION OF PROCEDURE:   After the risks and benefits of the procedure were explained, informed consent was obtained.  The LB GIF-H180 [id number] endoscope was introduced through the mouth and advanced to the second portion of the duodenum.  The instrument was slowly withdrawn as the mucosa was fully examined. <<PROCEDUREIMAGES>>  A hiatal hernia was found. 4-5 CM. HH NOTED.NO STRICTURE.DILATED #79F.NO HEME OR PAIN.  Duodenitis was found. MILD GASTRITIS/DUODENITIS.CLO AND REGULAR BIOPSIES DONE.    Retroflexed views revealed no abnormalities.    The scope was then withdrawn from the patient and the procedure completed.  COMPLICATIONS:  None  ENDOSCOPIC IMPRESSION: 1) Hiatal hernia 2) Duodenitis HISTORY OF GASTROPARESIS. RECOMMENDATIONS: 1) Await biopsy results CONTINUE DOMPERIDOME 10 MG TID AND GASTROPARESIS DIET  ______________________________ Vania Rea. Jarold Motto, MD, [facility]  CC:  Lerry Liner MD  n. Rosalie DoctorVania Rea. Patterson at 03/18/2012 02:25 PM  Sherrie Sport, [phone]

## 2012-03-18 NOTE — Progress Notes (Signed)
The pt c/o scratch sore throat.  I aqdvised her to do warm water gargle at home.  May use OTC chlorceptic lozengers .  MAW  Patient did not have preoperative order for IV antibiotic SSI prophylaxis. [phone]) Patient did not experience any of the following events: a burn prior to discharge; a fall within the facility; wrong site/side/patient/procedure/implant event; or a hospital transfer or hospital admission upon discharge from the facility. [phone])

## 2012-03-18 NOTE — Progress Notes (Signed)
Propofol given and oxygen managed per D Merritt CRNA 

## 2012-03-20 ENCOUNTER — Telehealth: Payer: Self-pay | Admitting: *Deleted

## 2012-03-20 LAB — HELICOBACTER PYLORI SCREEN-BIOPSY: UREASE: NEGATIVE

## 2012-03-20 NOTE — Telephone Encounter (Signed)
No answer,left message to call back if questions or concerns.   

## 2012-03-23 ENCOUNTER — Encounter: Payer: Self-pay | Admitting: Gastroenterology

## 2012-03-27 ENCOUNTER — Telehealth: Payer: Self-pay | Admitting: Gastroenterology

## 2012-03-27 NOTE — Telephone Encounter (Signed)
Discussed pt's f/u letter with Path results; pt stated understanding. Pt wants to know when Dr Jarold Motto will repeat the COLON; she states she did not tolerate the Movi Prep and that Dr Jarold Motto would order something different Dr Jarold Motto, do you want to r/s the COLON and if so, please list the prep? Thanks.

## 2012-03-27 NOTE — Telephone Encounter (Signed)
NOT NOW.Marland Kitchen

## 2012-03-30 NOTE — Telephone Encounter (Signed)
Informed pt that Dr Jarold Motto does not want to repeat the COLON at this time; pt stated understanding.

## 2012-05-16 ENCOUNTER — Emergency Department (HOSPITAL_COMMUNITY)
Admission: EM | Admit: 2012-05-16 | Discharge: 2012-05-17 | Disposition: A | Discharge status: Home / Self Care | Payer: PRIVATE HEALTH INSURANCE | Attending: Emergency Medicine | Admitting: Emergency Medicine

## 2012-05-16 ENCOUNTER — Encounter (HOSPITAL_COMMUNITY): Payer: Self-pay | Admitting: *Deleted

## 2012-05-16 DIAGNOSIS — F411 Generalized anxiety disorder: Secondary | ICD-10-CM | POA: Insufficient documentation

## 2012-05-16 DIAGNOSIS — J4489 Other specified chronic obstructive pulmonary disease: Secondary | ICD-10-CM | POA: Insufficient documentation

## 2012-05-16 DIAGNOSIS — Z79899 Other long term (current) drug therapy: Secondary | ICD-10-CM | POA: Insufficient documentation

## 2012-05-16 DIAGNOSIS — J449 Chronic obstructive pulmonary disease, unspecified: Secondary | ICD-10-CM | POA: Insufficient documentation

## 2012-05-16 DIAGNOSIS — R05 Cough: Secondary | ICD-10-CM | POA: Insufficient documentation

## 2012-05-16 DIAGNOSIS — F17200 Nicotine dependence, unspecified, uncomplicated: Secondary | ICD-10-CM | POA: Insufficient documentation

## 2012-05-16 DIAGNOSIS — K219 Gastro-esophageal reflux disease without esophagitis: Secondary | ICD-10-CM | POA: Insufficient documentation

## 2012-05-16 DIAGNOSIS — F329 Major depressive disorder, single episode, unspecified: Secondary | ICD-10-CM | POA: Insufficient documentation

## 2012-05-16 DIAGNOSIS — R079 Chest pain, unspecified: Secondary | ICD-10-CM | POA: Insufficient documentation

## 2012-05-16 DIAGNOSIS — R059 Cough, unspecified: Secondary | ICD-10-CM | POA: Insufficient documentation

## 2012-05-16 DIAGNOSIS — I252 Old myocardial infarction: Secondary | ICD-10-CM | POA: Insufficient documentation

## 2012-05-16 DIAGNOSIS — F3289 Other specified depressive episodes: Secondary | ICD-10-CM | POA: Insufficient documentation

## 2012-05-16 DIAGNOSIS — S20219A Contusion of unspecified front wall of thorax, initial encounter: Secondary | ICD-10-CM | POA: Insufficient documentation

## 2012-05-16 NOTE — ED Notes (Signed)
Pt alert, arrives from home via EMS, c/o left rib axilla pain, onset several weeks ago while opening a door, resp even unlabored, skin pwd, ambulates to triage

## 2012-05-16 NOTE — ED Notes (Signed)
Bed:WTR6<BR> Expected date:<BR> Expected time:<BR> Means of arrival:<BR> Comments:<BR> CLOSED

## 2012-05-16 NOTE — ED Notes (Signed)
Pt c/o L sided rib and back pain after shutting a van door x 3 weeks ago. Pt states pain is constant and it hurts to cough. Pt has hx of prior MI

## 2012-05-17 ENCOUNTER — Emergency Department (HOSPITAL_COMMUNITY): Payer: PRIVATE HEALTH INSURANCE

## 2012-05-17 LAB — CBC
MCH: 30.4 pg (ref 26.0–34.0)
Platelets: 279 10*3/uL (ref 150–400)
RBC: 4.44 MIL/uL (ref 3.87–5.11)
WBC: 8.4 10*3/uL (ref 4.0–10.5)

## 2012-05-17 LAB — BASIC METABOLIC PANEL
CO2: 24 mEq/L (ref 19–32)
Calcium: 9.6 mg/dL (ref 8.4–10.5)
GFR calc non Af Amer: >90 mL/min (ref >90)
Potassium: 3.4 mEq/L — ABNORMAL LOW (ref 3.5–5.1)
Sodium: 137 mEq/L (ref 135–145)

## 2012-05-17 MED ORDER — KETOROLAC TROMETHAMINE 30 MG/ML IJ SOLN
30.0000 mg | Freq: Once | INTRAMUSCULAR | Status: AC
  Administered 2012-05-17: 30 mg via INTRAVENOUS
  Filled 2012-05-17: qty 1

## 2012-05-17 MED ORDER — IBUPROFEN 600 MG PO TABS: 600.0000 mg | ORAL_TABLET | Freq: Three times a day (TID) | ORAL | Status: AC

## 2012-05-17 NOTE — ED Provider Notes (Signed)
History     CSN: [id number]  Arrival date & time 05/16/12  2008   First MD Initiated Contact with Patient 05/17/12 [phone]      Chief Complaint  Patient presents with  . Chest Pain    x 3 weeks    (Consider location/radiation/quality/duration/timing/severity/associated sxs/prior treatment) HPI The patient presents with concerns over persistent left rib pain.  The pain is focally about the left inferior axilla and left anterior inferior ribs.  Pain began after she ran into the edge of the vehicle 3 weeks ago.  Since that time the pain has been persistent, not radiating, sore, worse with coughing or laughing.  The pain is nonexertional, and there is no other chest pain or dyspnea.  There is minimal cough.  Patient denies any fevers, chills.  She has not taken any medication in 3 weeks for this pain.  She denies any other complaints. Past Medical History  Diagnosis Date  . COPD (chronic obstructive pulmonary disease)   . Acute MI     x3  . Asthma   . Chronic bronchitis   . Barrett esophagus 2007  . Hiatal hernia 1191,4782  . Duodenitis without mention of hemorrhage 2007  . Esophageal stricture   . Esophageal reflux 2007  . Anxiety   . Depression   . Multiple fractures     from falls, fx rt. elbow, fx left wrist, bilateral ankles  . Allergy   . Ulcer     Past Surgical History  Procedure Date  . Abdominal hysterectomy   . Neck surgery   . Cholecystectomy   . Elbow fracture surgery     right  . Wrist fracture surgery     left, has plate  . Colonoscopy     Family History  Problem Relation Age of Onset  . Emphysema Father   . Colon cancer Neg Hx   . Diabetes Maternal Grandmother   . Diabetes Maternal Aunt   . Diabetes      mat. cousin  . Heart disease Brother     History  Substance Use Topics  . Smoking status: Current Everyday Smoker -- 0.5 packs/day for 21 years    Types: Cigarettes  . Smokeless tobacco: Never Used   Comment: tobaccco info given 03/06/12  .  Alcohol Use: No    OB History    Grav Para Term Preterm Abortions TAB SAB Ect Mult Living                  Review of Systems  Constitutional:       HPI  HENT:       HPI otherwise negative  Eyes: Negative.   Respiratory:       HPI, otherwise negative  Cardiovascular:       HPI, otherwise nmegative  Gastrointestinal: Negative for vomiting.  Genitourinary:       HPI, otherwise negative  Musculoskeletal:       HPI, otherwise negative  Skin: Negative.   Neurological: Negative for syncope.    Allergies  Codeine; Propoxyphene-acetaminophen; and Hydrocodone-acetaminophen  Home Medications   Current Outpatient Rx  Name Route Sig Dispense Refill  . ALBUTEROL SULFATE HFA 108 (90 BASE) MCG/ACT IN AERS Inhalation Inhale 2 puffs into the lungs every 6 (six) hours as needed.    . ALPRAZOLAM 0.5 MG PO TABS Oral Take 0.5 mg by mouth at bedtime as needed.    . ASPIRIN 81 MG PO TABS Oral Take 81 mg by mouth daily.    Marland Kitchen  ATORVASTATIN CALCIUM 20 MG PO TABS Oral Take 20 mg by mouth daily.    Marland Kitchen BENZOCAINE 20 % MT PSTE Mouth/Throat Use as directed 1 application in the mouth or throat 4 (four) times daily as needed. Patient is to use this medication for gum or tooth pain.    Marland Kitchen BUTALBITAL-APAP-CAFF-COD 50-325-40-30 MG PO CAPS Oral Take 1 capsule by mouth every 4 (four) hours as needed.    . CHOLINE FENOFIBRATE 135 MG PO CPDR Oral Take 135 mg by mouth daily.    . CYCLOBENZAPRINE HCL 10 MG PO TABS Oral Take 10 mg by mouth every 8 (eight) hours as needed.    . DOCOSANOL 10 % EX CREA Apply externally Apply 1 application topically daily as needed.    Marland Kitchen DOCUSATE SODIUM 100 MG PO CAPS Oral Take 100 mg by mouth 2 (two) times daily.    Marland Kitchen ESOMEPRAZOLE MAGNESIUM 40 MG PO CPDR Oral Take 40 mg by mouth daily before breakfast.    . LOPERAMIDE HCL 2 MG PO CAPS Oral Take 2 mg by mouth every 8 (eight) hours as needed. Patient is to take this medication every 8 hours as needed for diarrhea.    . ARTHRITIS HOT  10-15 % EX CREA Apply externally Apply 1 application topically daily as needed. Patient is to used this medication for arthritis pain.    Marland Kitchen METRONIDAZOLE 0.75 % EX GEL Topical Apply topically 2 (two) times daily.    Marland Kitchen MONTELUKAST SODIUM 10 MG PO TABS Oral Take 10 mg by mouth at bedtime.    Marland Kitchen NITROGLYCERIN 0.3 MG SL SUBL Sublingual Place 0.3 mg under the tongue every 5 (five) minutes as needed.    Marland Kitchen OMEPRAZOLE 40 MG PO CPDR Oral Take 40 mg by mouth daily.    Marland Kitchen PRESCRIPTION MEDICATION  Allergy shots for mold, cat, and dog , one shot every month in each arm , Given at PCP    . ALIGN 4 MG PO CAPS Oral Take 1 capsule by mouth daily. 14 capsule 0  . VALACYCLOVIR HCL 500 MG PO TABS Oral Take 500 mg by mouth 2 (two) times daily.      BP 122/63  Pulse 75  Temp 98.9 F (37.2 C) (Oral)  Resp 18  SpO2 100%  Physical Exam  Nursing note and vitals reviewed. Constitutional: She is oriented to person, place, and time. She appears well-developed and well-nourished. No distress.  HENT:  Head: Normocephalic and atraumatic.  Eyes: Conjunctivae and EOM are normal.  Cardiovascular: Normal rate and regular rhythm.   Pulmonary/Chest: Effort normal and breath sounds normal. No stridor. No respiratory distress.    Abdominal: She exhibits no distension.  Musculoskeletal: She exhibits no edema.  Neurological: She is alert and oriented to person, place, and time. No cranial nerve deficit.  Skin: Skin is warm and dry.  Psychiatric: She has a normal mood and affect.    ED Course  Procedures (including critical care time)   Labs Reviewed  CBC  BASIC METABOLIC PANEL   No results found.   No diagnosis found.  Oxygen 100 percent room air normal   Date: 05/17/2012  Rate: 72  Rhythm: normal sinus rhythm  QRS Axis: normal  Intervals: normal  ST/T Wave abnormalities: normal  Conduction Disutrbances: none  Narrative Interpretation: unremarkable     MDM  The patient presents with 3 weeks of pain  in the left inferior anterior chest and axilla.  The consistency of the pain, the onset following trauma, the absence  of other notable findings are all reassuring for the low suspicion of infection or cardiovascular etiology.  The patient does have a history of vascular disease, but again the consistency of her symptoms, the absence of distress is suggestive of musculoskeletal etiology, likely contusion versus fracture.  The patient's x-ray does not demonstrate fracture.  She is discharged in stable condition, with instructions to take medication, which she has not done in the past 3 weeks.  Gerhard Munch, MD 05/17/12 [phone]

## 2012-06-13 IMAGING — MR MR [PERSON_NAME] LOW JT W/O CM*R*
4 of 5 series · 19 of 40 positions shown · non-contrast
Comparison: None

CLINICAL DATA: Right knee pain.

MRI RIGHT KNEE WITHOUT CONTRAST
TECHNIQUE: Multiplanar, multisequence MR imaging of the right knee
was performed.  No intravenous contrast was administered.

[Series 3: PD fat-sat · axial · 4.0mm · 0.27mm/px · z∈[-68,+27]mm · 8 of 20 slices shown (1 of 3)]
[im 1/20]
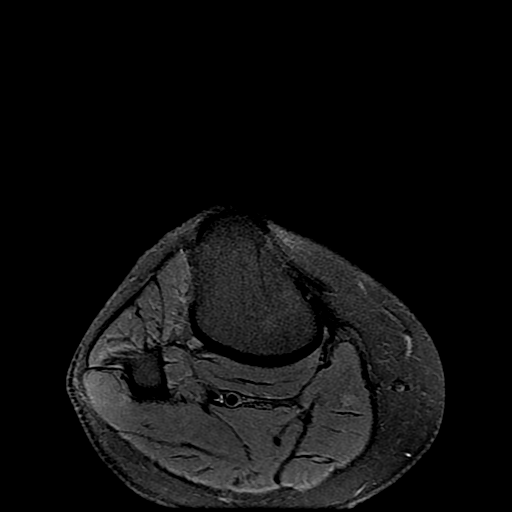
[im 3/20]
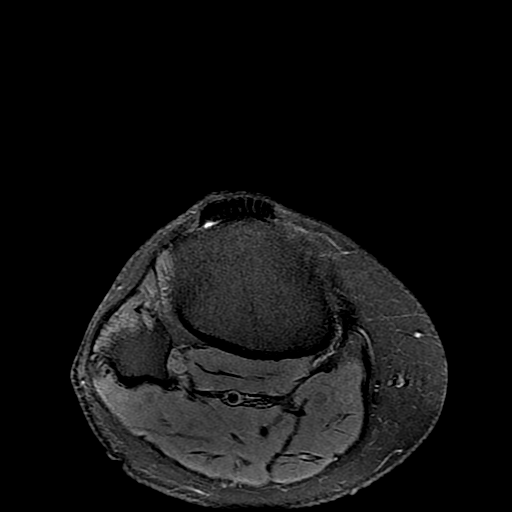
[im 6/20]
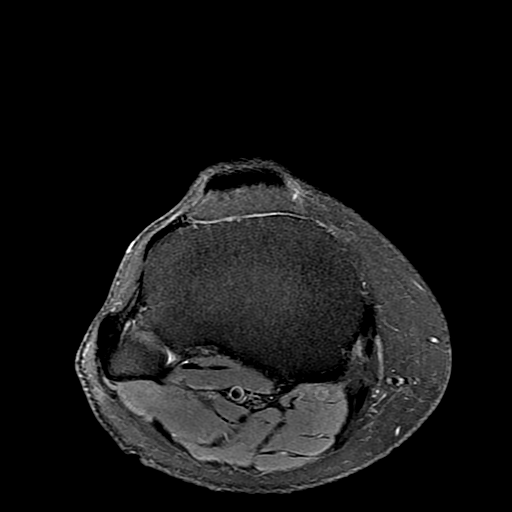
[im 9/20]
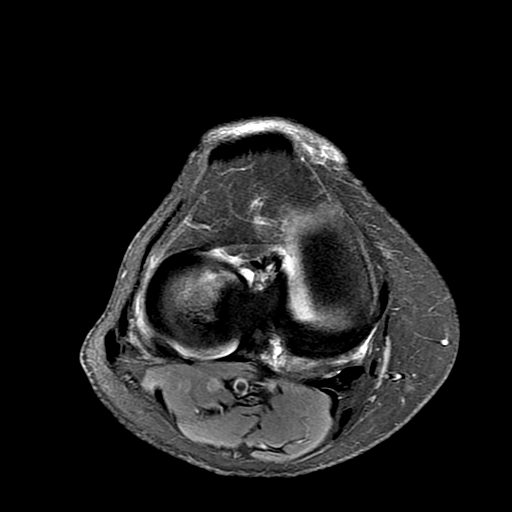
[im 11/20]
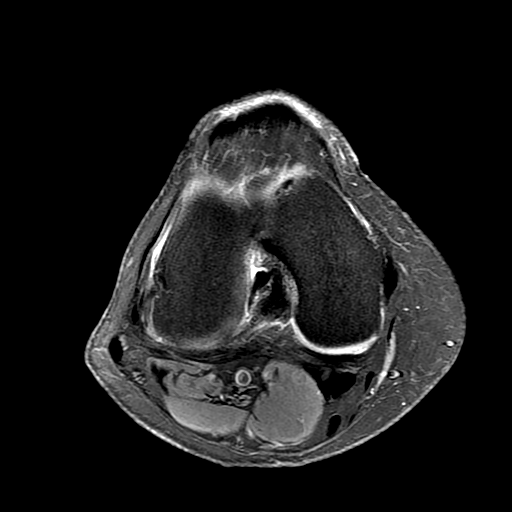
[im 14/20]
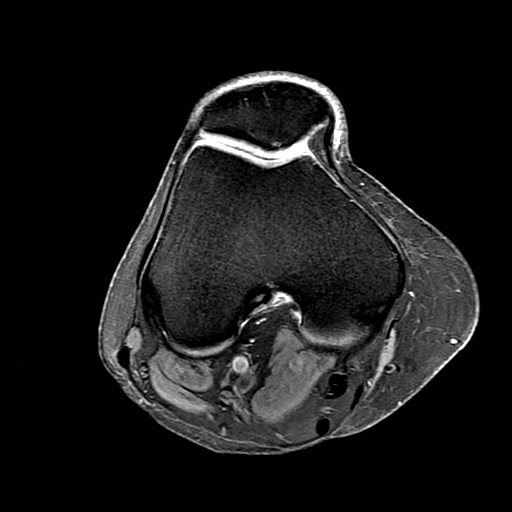
[im 17/20]
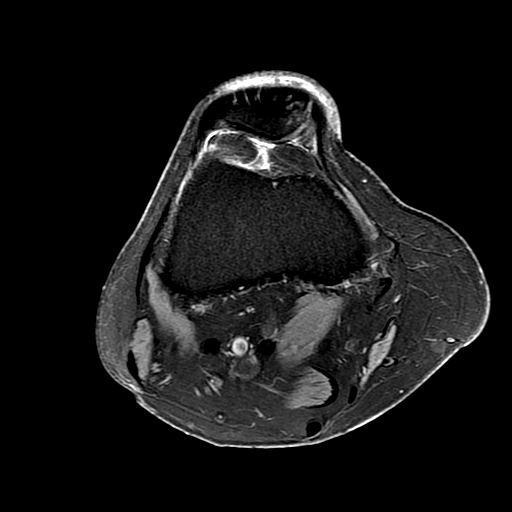
[im 20/20]
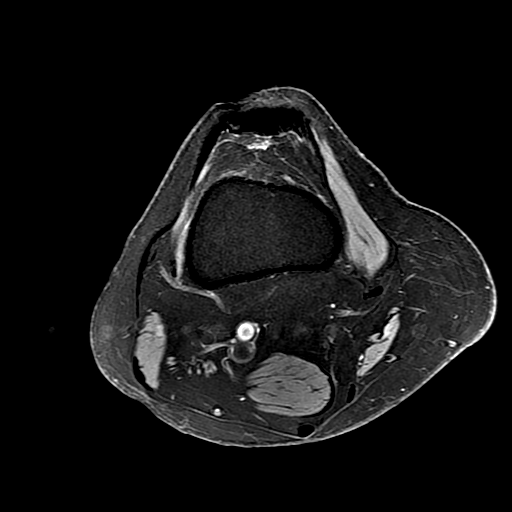

[Series 5: T2 fat-sat · coronal · 4.0mm · 0.31mm/px · 3 of 23 slices shown]
[im 4/23]
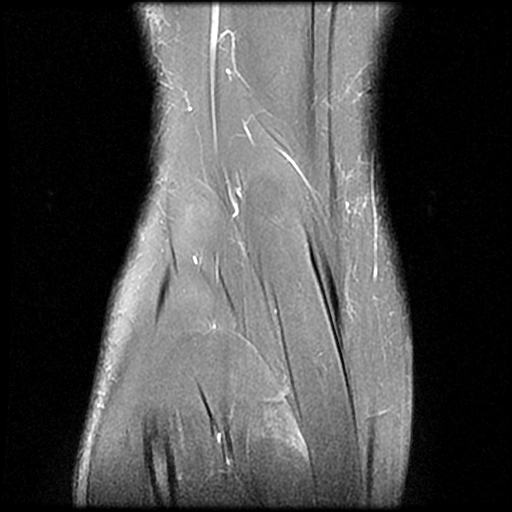
[im 13/23]
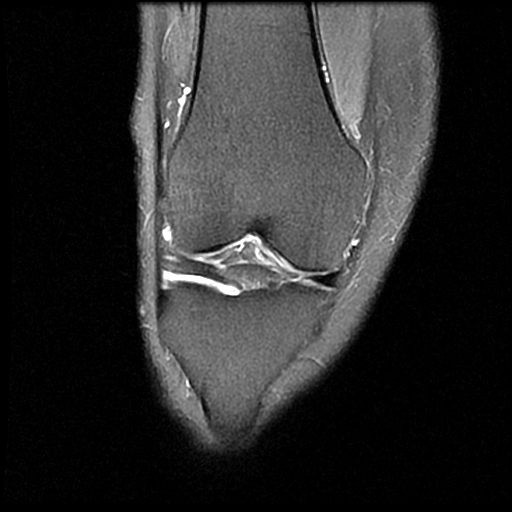
[im 19/23]
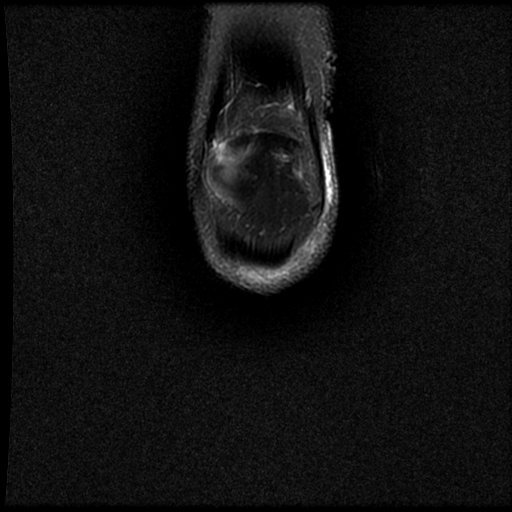

[Series 6: PD fat-sat · coronal · 4.0mm · 0.31mm/px · 5 of 23 slices shown (2 of 3)]
[im 1/23]
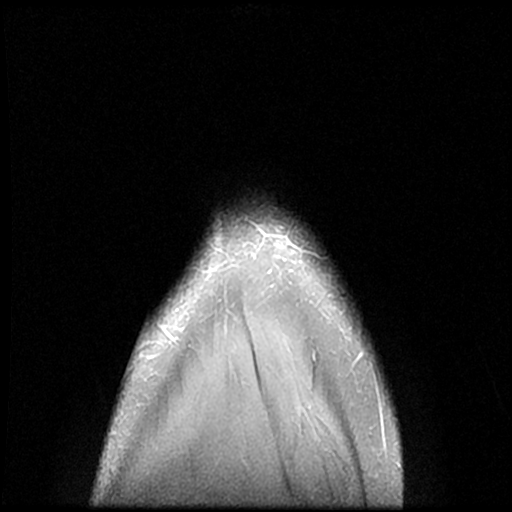
[im 4/23]
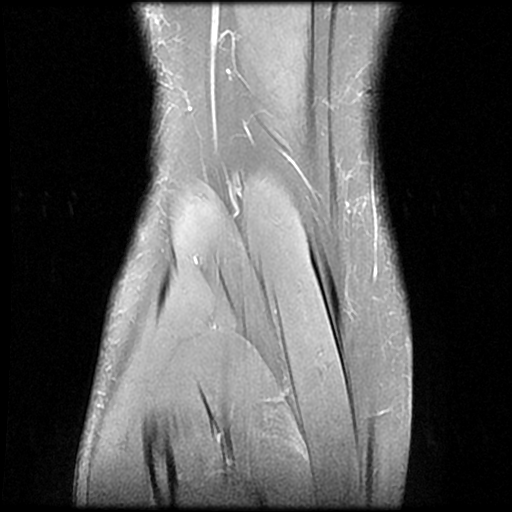
[im 7/23]
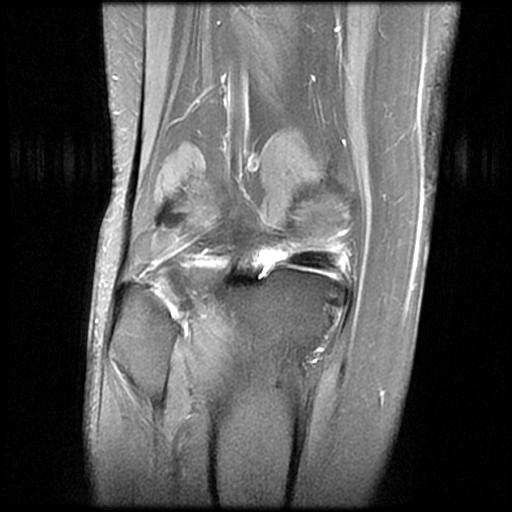
[im 13/23]
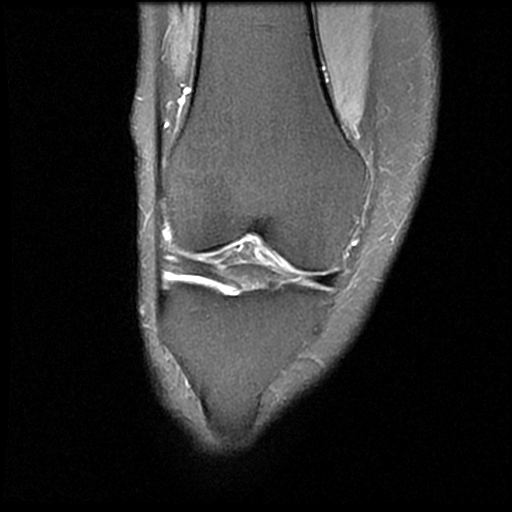
[im 19/23]
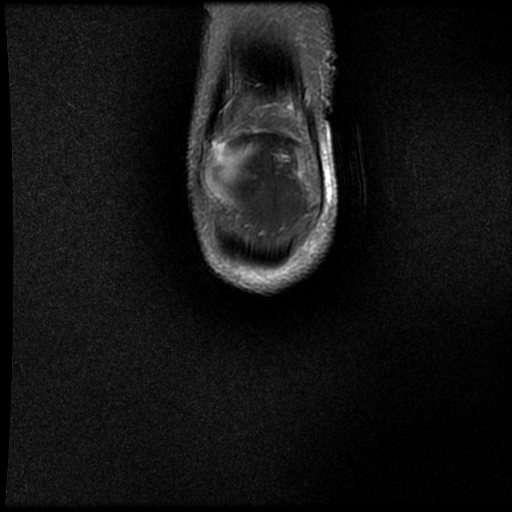

[Series 7: PD fat-sat · sagittal · 3.0mm · 0.27mm/px · 3 of 22 slices shown (3 of 3)]
[im 4/22]
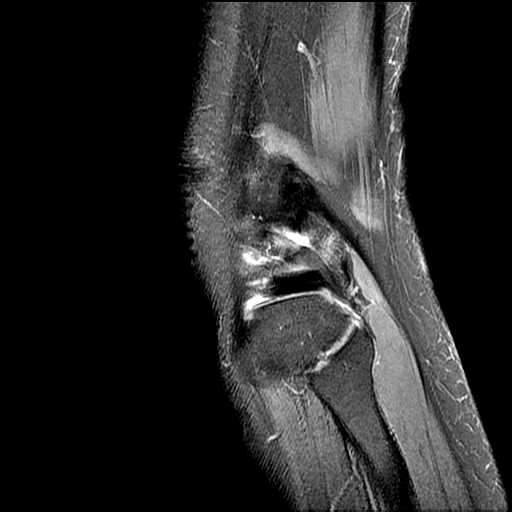
[im 13/22]
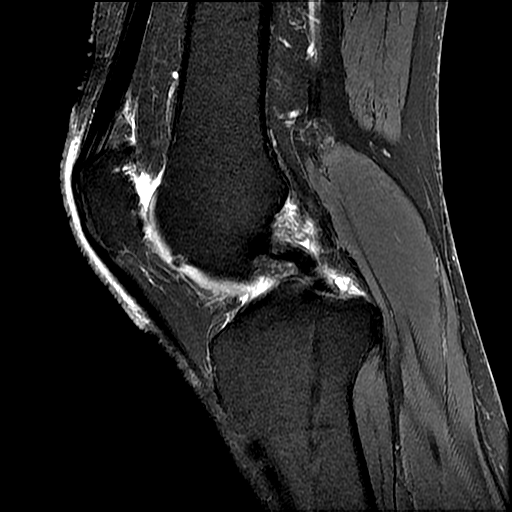
[im 19/22]
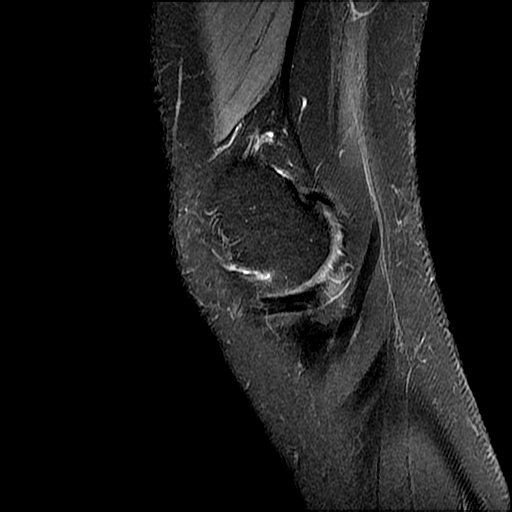

[19 of 40 positions shown; findings below may reference images not displayed]

FINDINGS: No acute bony findings.  No marrow edema or bone
contusions.  There are mild tricompartmental degenerative changes
with early joint space narrowing, osteophytic spurring and
degenerative chondrosis.  This is most significant at the patellar
apex and along the medial facet where there is full or near full-
thickness cartilage loss and possible loose cartilaginous
fragments.

The cruciate and collateral ligaments are intact.

The menisci demonstrate normal morphology.  Minimal intrasubstance
degenerative changes but no discrete meniscal tear.

The patellar retinacular structures appear normal and the
quadriceps and patellar tendons are intact.  No joint effusion or
Baker's cyst.  A small bony exostosis is noted off the posterior
and medial aspect of the fibular head.
IMPRESSION: 1.  Mild tricompartmental degenerative changes.  There is
significant degenerative chondrosis/chondromalacia involving the
patellar apex and medial facet with areas of full or near full-
thickness cartilage loss and possible small loose cartilaginous
fragments.
2.  Intact ligamentous structures and no acute bony findings.
3.  No meniscal tears.
4.  No joint effusion or Baker's cyst.

## 2012-09-05 ENCOUNTER — Emergency Department (HOSPITAL_COMMUNITY): Payer: PRIVATE HEALTH INSURANCE

## 2012-09-05 ENCOUNTER — Encounter (HOSPITAL_COMMUNITY): Payer: Self-pay | Admitting: *Deleted

## 2012-09-05 ENCOUNTER — Emergency Department (HOSPITAL_COMMUNITY)
Admission: EM | Admit: 2012-09-05 | Discharge: 2012-09-05 | Disposition: A | Discharge status: Home / Self Care | Payer: PRIVATE HEALTH INSURANCE | Attending: Emergency Medicine | Admitting: Emergency Medicine

## 2012-09-05 DIAGNOSIS — Z79899 Other long term (current) drug therapy: Secondary | ICD-10-CM | POA: Insufficient documentation

## 2012-09-05 DIAGNOSIS — R059 Cough, unspecified: Secondary | ICD-10-CM | POA: Insufficient documentation

## 2012-09-05 DIAGNOSIS — J4489 Other specified chronic obstructive pulmonary disease: Secondary | ICD-10-CM | POA: Insufficient documentation

## 2012-09-05 DIAGNOSIS — Z8719 Personal history of other diseases of the digestive system: Secondary | ICD-10-CM | POA: Insufficient documentation

## 2012-09-05 DIAGNOSIS — R05 Cough: Secondary | ICD-10-CM | POA: Insufficient documentation

## 2012-09-05 DIAGNOSIS — Z8659 Personal history of other mental and behavioral disorders: Secondary | ICD-10-CM | POA: Insufficient documentation

## 2012-09-05 DIAGNOSIS — Z90710 Acquired absence of both cervix and uterus: Secondary | ICD-10-CM | POA: Insufficient documentation

## 2012-09-05 DIAGNOSIS — R1110 Vomiting, unspecified: Secondary | ICD-10-CM | POA: Insufficient documentation

## 2012-09-05 DIAGNOSIS — I252 Old myocardial infarction: Secondary | ICD-10-CM | POA: Insufficient documentation

## 2012-09-05 DIAGNOSIS — R197 Diarrhea, unspecified: Secondary | ICD-10-CM | POA: Insufficient documentation

## 2012-09-05 DIAGNOSIS — J45909 Unspecified asthma, uncomplicated: Secondary | ICD-10-CM | POA: Insufficient documentation

## 2012-09-05 DIAGNOSIS — F17200 Nicotine dependence, unspecified, uncomplicated: Secondary | ICD-10-CM | POA: Insufficient documentation

## 2012-09-05 DIAGNOSIS — Z9089 Acquired absence of other organs: Secondary | ICD-10-CM | POA: Insufficient documentation

## 2012-09-05 DIAGNOSIS — Z8781 Personal history of (healed) traumatic fracture: Secondary | ICD-10-CM | POA: Insufficient documentation

## 2012-09-05 DIAGNOSIS — Z9889 Other specified postprocedural states: Secondary | ICD-10-CM | POA: Insufficient documentation

## 2012-09-05 DIAGNOSIS — J449 Chronic obstructive pulmonary disease, unspecified: Secondary | ICD-10-CM | POA: Insufficient documentation

## 2012-09-05 LAB — URINALYSIS, ROUTINE W REFLEX MICROSCOPIC
Bilirubin Urine: NEGATIVE
Ketones, ur: NEGATIVE mg/dL
Nitrite: NEGATIVE
Urobilinogen, UA: 0.2 mg/dL (ref 0.0–1.0)

## 2012-09-05 LAB — CBC WITH DIFFERENTIAL/PLATELET
Hemoglobin: 12.4 g/dL (ref 12.0–15.0)
Lymphocytes Relative: 42 % (ref 12–46)
Lymphs Abs: 2.9 10*3/uL (ref 0.7–4.0)
MCV: 84.2 fL (ref 78.0–100.0)
Neutrophils Relative %: 48 % (ref 43–77)
Platelets: 260 10*3/uL (ref 150–400)
RBC: 4.23 MIL/uL (ref 3.87–5.11)
WBC: 7 10*3/uL (ref 4.0–10.5)

## 2012-09-05 LAB — URINE MICROSCOPIC-ADD ON

## 2012-09-05 LAB — COMPREHENSIVE METABOLIC PANEL
ALT: 9 U/L (ref 0–35)
Alkaline Phosphatase: 80 U/L (ref 39–117)
CO2: 21 mEq/L (ref 19–32)
GFR calc Af Amer: 73 mL/min — ABNORMAL LOW (ref >90)
GFR calc non Af Amer: 63 mL/min — ABNORMAL LOW (ref >90)
Glucose, Bld: 93 mg/dL (ref 70–99)
Potassium: 3.8 mEq/L (ref 3.5–5.1)
Sodium: 136 mEq/L (ref 135–145)
Total Bilirubin: 0.2 mg/dL — ABNORMAL LOW (ref 0.3–1.2)

## 2012-09-05 MED ORDER — SODIUM CHLORIDE 0.9 % IV SOLN
1000.0000 mL | Freq: Once | INTRAVENOUS | Status: AC
  Administered 2012-09-05: 1000 mL via INTRAVENOUS

## 2012-09-05 MED ORDER — ONDANSETRON 8 MG PO TBDP: 8.0000 mg | ORAL_TABLET | Freq: Three times a day (TID) | ORAL | Status: DC | PRN

## 2012-09-05 MED ORDER — OXYCODONE-ACETAMINOPHEN 5-325 MG PO TABS: 1.0000 | ORAL_TABLET | Freq: Four times a day (QID) | ORAL | Status: DC | PRN

## 2012-09-05 MED ORDER — SODIUM CHLORIDE 0.9 % IV SOLN
1000.0000 mL | INTRAVENOUS | Status: DC
  Administered 2012-09-05: 1000 mL via INTRAVENOUS

## 2012-09-05 MED ORDER — ONDANSETRON HCL 4 MG/2ML IJ SOLN
4.0000 mg | Freq: Once | INTRAMUSCULAR | Status: AC
  Administered 2012-09-05: 4 mg via INTRAVENOUS
  Filled 2012-09-05: qty 2

## 2012-09-05 MED ORDER — NAPROXEN 500 MG PO TABS: 500.0000 mg | ORAL_TABLET | Freq: Two times a day (BID) | ORAL | Status: DC

## 2012-09-05 MED ORDER — OXYCODONE-ACETAMINOPHEN 5-325 MG PO TABS
1.0000 | ORAL_TABLET | Freq: Once | ORAL | Status: AC
  Administered 2012-09-05: 1 via ORAL
  Filled 2012-09-05: qty 1

## 2012-09-05 NOTE — ED Notes (Signed)
RN to obtain labs with start of IV 

## 2012-09-05 NOTE — ED Notes (Signed)
Per ems: pt from home, c/o flu like symptoms x1 week. Reports n/v/d, vertigo, cough, fever at 103. Saw pcp on Thursday. Hx of asthma and chronic back pain. bp 130/90, pulse 68 regular, respirations 18 even/unlabored, saO2 100% ra

## 2012-09-05 NOTE — ED Provider Notes (Signed)
History     CSN: [id number]  Arrival date & time 09/05/12  1256   First MD Initiated Contact with Patient 09/05/12 1328      Chief Complaint  Patient presents with  . Emesis  . Diarrhea  . Cough    HPI Patient has been having flulike symptoms over the last week. Initially started with cough and congestion. She also has been having fevers at home up to 103 earlier in the week. She saw her primary care doctor on Thursday and was given shots of antibiotics according to the patient. She has continued to have a few episodes of vomiting and diarrhea per day. She has not noticed any blood in the stool. Patient feels like she is getting weaker. This increases whenever she tries to stand up. Currently she is not having any shortness of breath. She denies any chest pain. Past Medical History  Diagnosis Date  . COPD (chronic obstructive pulmonary disease)   . Acute MI     x3  . Asthma   . Chronic bronchitis   . Barrett esophagus 2007  . Hiatal hernia 6213,0865  . Duodenitis without mention of hemorrhage 2007  . Esophageal stricture   . Esophageal reflux 2007  . Anxiety   . Depression   . Multiple fractures     from falls, fx rt. elbow, fx left wrist, bilateral ankles  . Allergy   . Ulcer     Past Surgical History  Procedure Date  . Abdominal hysterectomy   . Neck surgery   . Cholecystectomy   . Elbow fracture surgery     right  . Wrist fracture surgery     left, has plate  . Colonoscopy     Family History  Problem Relation Age of Onset  . Emphysema Father   . Colon cancer Neg Hx   . Diabetes Maternal Grandmother   . Diabetes Maternal Aunt   . Diabetes      mat. cousin  . Heart disease Brother     History  Substance Use Topics  . Smoking status: Current Every Day Smoker -- 0.5 packs/day for 21 years    Types: Cigarettes  . Smokeless tobacco: Never Used     Comment: tobaccco info given 03/06/12  . Alcohol Use: No    OB History    Grav Para Term Preterm  Abortions TAB SAB Ect Mult Living                  Review of Systems  All other systems reviewed and are negative.    Allergies  Codeine; Propoxyphene-acetaminophen; and Hydrocodone-acetaminophen  Home Medications   Current Outpatient Rx  Name  Route  Sig  Dispense  Refill  . CYCLOBENZAPRINE HCL 10 MG PO TABS   Oral   Take 10 mg by mouth every 8 (eight) hours as needed. For muscle spasm.         Marland Kitchen ESOMEPRAZOLE MAGNESIUM 40 MG PO CPDR   Oral   Take 40 mg by mouth daily before breakfast.         . MONTELUKAST SODIUM 10 MG PO TABS   Oral   Take 10 mg by mouth at bedtime.         Marland Kitchen NITROGLYCERIN 0.4 MG SL SUBL   Sublingual   Place 0.4 mg under the tongue every 5 (five) minutes as needed. For chest pain.         Marland Kitchen PRESCRIPTION MEDICATION      Allergy shots for  mold, cat, and dog , one shot every month in each arm , Given at PCP         . VALACYCLOVIR HCL 500 MG PO TABS   Oral   Take 500 mg by mouth 2 (two) times daily.         Marland Kitchen VITAMIN D 1000 UNITS PO TABS   Oral   Take 1,000 Units by mouth daily.           BP 111/56  Pulse 81  Temp 98.2 F (36.8 C) (Oral)  Resp 18  SpO2 100%  Physical Exam  Nursing note and vitals reviewed. Constitutional: She appears well-developed and well-nourished. No distress.  HENT:  Head: Normocephalic and atraumatic.  Right Ear: External ear normal.  Left Ear: External ear normal.  Eyes: Conjunctivae normal are normal. Right eye exhibits no discharge. Left eye exhibits no discharge. No scleral icterus.  Neck: Neck supple. No tracheal deviation present.  Cardiovascular: Normal rate, regular rhythm and intact distal pulses.   Pulmonary/Chest: Effort normal and breath sounds normal. No stridor. No respiratory distress. She has no wheezes. She has no rales.  Abdominal: Soft. Bowel sounds are normal. She exhibits no distension. There is no tenderness. There is no rebound and no guarding.  Musculoskeletal: She exhibits no  edema and no tenderness.  Neurological: She is alert. She has normal strength. No sensory deficit. Cranial nerve deficit:  no gross defecits noted. She exhibits normal muscle tone. She displays no seizure activity. Coordination normal.  Skin: Skin is warm and dry. No rash noted.  Psychiatric: She has a normal mood and affect.    ED Course  Procedures (including critical care time)  Labs Reviewed  CBC WITH DIFFERENTIAL - Abnormal; Notable for the following:    HCT 35.6 (*)     All other components within normal limits  COMPREHENSIVE METABOLIC PANEL - Abnormal; Notable for the following:    Albumin 3.3 (*)     Total Bilirubin 0.2 (*)     GFR calc non Af Amer 63 (*)     GFR calc Af Amer 73 (*)     All other components within normal limits  URINALYSIS, ROUTINE W REFLEX MICROSCOPIC - Abnormal; Notable for the following:    Leukocytes, UA SMALL (*)     All other components within normal limits  URINE MICROSCOPIC-ADD ON   Dg Chest 2 View  09/05/2012  *RADIOLOGY REPORT*  Clinical Data: Vomiting, cough, diarrhea  CHEST - 2 VIEW  Comparison: 05/17/2012  Findings: The cardiomediastinal silhouette is stable. Hyperinflation again noted.  No acute infiltrate or pulmonary edema.  Bony thorax is stable. Stable left basilar atelectasis.  IMPRESSION: No active disease.  Hyperinflation again noted.   Original Report Authenticated By: Natasha Mead, M.D.      1. Influenza-like illness       MDM  Patient does not have evidence of pneumonia or UTI. I suspect she has an influenza-like illness although she did have her flu vaccine this year. Patient be discharged home on supportive medications.  At this time there does not appear to be any evidence of an acute emergency medical condition and the patient appears stable for discharge with appropriate outpatient follow up.         Celene Kras, MD 09/05/12 [phone]

## 2012-09-05 NOTE — ED Notes (Signed)
ZOX:[id number]<AV> Expected date:09/05/12<BR> Expected time: 1:01 PM<BR> Means of arrival:Ambulance<BR> Comments:<BR> Fever, cough, HTN

## 2012-09-29 ENCOUNTER — Ambulatory Visit: Payer: PRIVATE HEALTH INSURANCE | Admitting: Gastroenterology

## 2012-10-06 ENCOUNTER — Encounter: Payer: Self-pay | Admitting: Gastroenterology

## 2012-10-06 ENCOUNTER — Other Ambulatory Visit (INDEPENDENT_AMBULATORY_CARE_PROVIDER_SITE_OTHER): Payer: PRIVATE HEALTH INSURANCE

## 2012-10-06 ENCOUNTER — Ambulatory Visit (INDEPENDENT_AMBULATORY_CARE_PROVIDER_SITE_OTHER): Payer: PRIVATE HEALTH INSURANCE | Admitting: Gastroenterology

## 2012-10-06 VITALS — BP 118/80 | HR 92 | Ht 63.25 in | Wt 126.4 lb

## 2012-10-06 DIAGNOSIS — F1310 Sedative, hypnotic or anxiolytic abuse, uncomplicated: Secondary | ICD-10-CM

## 2012-10-06 DIAGNOSIS — R109 Unspecified abdominal pain: Secondary | ICD-10-CM

## 2012-10-06 DIAGNOSIS — E785 Hyperlipidemia, unspecified: Secondary | ICD-10-CM

## 2012-10-06 DIAGNOSIS — F1110 Opioid abuse, uncomplicated: Secondary | ICD-10-CM

## 2012-10-06 LAB — C-REACTIVE PROTEIN: CRP: 1.5 mg/dL (ref 0.5–20.0)

## 2012-10-06 LAB — COMPREHENSIVE METABOLIC PANEL
ALT: 10 U/L (ref 0–35)
AST: 12 U/L (ref 0–37)
CO2: 25 mEq/L (ref 19–32)
Chloride: 103 mEq/L (ref 96–112)
GFR: 81.15 mL/min (ref >60.00)
Sodium: 135 mEq/L (ref 135–145)
Total Bilirubin: 0.4 mg/dL (ref 0.3–1.2)
Total Protein: 7.2 g/dL (ref 6.0–8.3)

## 2012-10-06 LAB — CBC WITH DIFFERENTIAL/PLATELET
Basophils Absolute: 0.1 10*3/uL (ref 0.0–0.1)
Eosinophils Absolute: 0 10*3/uL (ref 0.0–0.7)
Lymphocytes Relative: 23.3 % (ref 12.0–46.0)
Lymphs Abs: 2.8 10*3/uL (ref 0.7–4.0)
MCHC: 33.8 g/dL (ref 30.0–36.0)
Monocytes Relative: 6.7 % (ref 3.0–12.0)
Platelets: 336 10*3/uL (ref 150.0–400.0)
RDW: 15.1 % — ABNORMAL HIGH (ref 11.5–14.6)

## 2012-10-06 LAB — IBC PANEL
Saturation Ratios: 17.2 % — ABNORMAL LOW (ref 20.0–50.0)
Transferrin: 248.7 mg/dL (ref 212.0–360.0)

## 2012-10-06 LAB — SEDIMENTATION RATE: Sed Rate: 17 mm/hr (ref 0–22)

## 2012-10-06 LAB — FOLATE: Folate: >24.8 ng/mL (ref >5.9)

## 2012-10-06 LAB — FERRITIN: Ferritin: 8.7 ng/mL — ABNORMAL LOW (ref 10.0–291.0)

## 2012-10-06 NOTE — Progress Notes (Signed)
This is a 57 year old Caucasian female with chronic pain syndrome and chronic narcotic dependency.  She now as per my schedule complaining of right lower quadrant pain of 5-6 weeks' duration.  She is status post hysterectomy and apparently removal of her ovaries.  She hasn't had numerous GI evaluations which been unremarkable, and continues to complain of pain in various areas.  She is up-to-date on her colonoscopy and endoscopy exams.  As usual, she is accompanied by her husband who reinforces her illnesses.  Despite this" severe" pain, she has no nausea vomiting, fever, chills, or systemic complaints.  She is worried that" maybe my appendix" she is seen by Dr. Mayford Knife primary care.  She said her for followup endoscopy which I do not think is indicated.  As mentioned before this patient has a long history of functional illnesses.  In fact, endoscopy is entirely normal in July of 2013.  She has a long history of multiple emergency room visits.  Current Medications, Allergies, Past Medical History, Past Surgical History, Family History and Social History were reviewed in [employer] record.  ROS: All systems were reviewed and are negative unless otherwise stated in the HPI.          Physical Exam: Healthy-appearing patient in no distress.  Blood pressure 118/80, pulse 92 and regular and weight under 26 pounds.  98% room oxygen saturation.  Her abdomen shows no distention, organomegaly, masses and she has a positive stethoscope sign of right or quadrant area.  Bowel sounds are entirely normal.  Mental status is normal.    Assessment and Plan: Chronic recurrent abdominal pain in a patient who has narcotic dependent, and I think she probably has intestinal adhesions, but her big problem is management of her pain syndrome.  To complete her workup I have ordered labs and repeat CT scan of the abdomen and pelvis.  If this is unremarkable, she needs an immediate referral to a Pain  Center for chronic management of her pain syndrome.  I do not think repeat endoscopic exams indicated at this time.  Also did not relieve the narcotic therapy.  I've urged her also to continue followup primary care with Dr. Mayford Knife.  Cc Dr. Lerry Liner Encounter Diagnosis  Name Primary?  . Abdominal pain Yes

## 2012-10-06 NOTE — Patient Instructions (Addendum)
You have been given a separate informational sheet regarding your tobacco use, the importance of quitting and local resources to help you quit.   You have been scheduled for a CT scan of the abdomen and pelvis at [facility] ([address]---this is in the same building as [facility]).   You are scheduled on 10-07-2012 at 9 AM. You should arrive 15 minutes prior to your appointment time for registration. Please follow the written instructions below on the day of your exam:  WARNING: IF YOU ARE ALLERGIC TO IODINE/X-RAY DYE, PLEASE NOTIFY RADIOLOGY IMMEDIATELY AT [phone]! YOU WILL BE GIVEN A 13 HOUR PREMEDICATION PREP.  1) Do not eat or drink anything after 5 AM (4 hours prior to your test) 2) You have been given 2 bottles of oral contrast to drink. The solution may taste better if refrigerated, but do NOT add ice or any other liquid to this solution. Shake well before drinking.    Drink 1 bottle of contrast @ 7 AM (2 hours prior to your exam)  Drink 1 bottle of contrast @ 8 AM (1 hour prior to your exam)  You may take any medications as prescribed with a small amount of water except for the following: Metformin, Glucophage, Glucovance, Avandamet, Riomet, Fortamet, Actoplus Met, Janumet, Glumetza or Metaglip. The above medications must be held the day of the exam AND 48 hours after the exam.  The purpose of you drinking the oral contrast is to aid in the visualization of your intestinal tract. The contrast solution may cause some diarrhea. Before your exam is started, you will be given a small amount of fluid to drink. Depending on your individual set of symptoms, you may also receive an intravenous injection of x-ray contrast/dye. Plan on being at [facility] for 30 minutes or long, depending on the type of exam you are having performed.  This test typically takes 30-45 minutes to complete.  If you have any questions regarding your exam or if you need to  reschedule, you may call the CT department at [phone] between the hours of 8:00 am and 5:00 pm, Monday-Friday.  ______________________________________________________________________________________________________________________  Your physician has requested that you go to the basement for lab work before leaving today

## 2012-10-07 ENCOUNTER — Ambulatory Visit (INDEPENDENT_AMBULATORY_CARE_PROVIDER_SITE_OTHER)
Admission: RE | Admit: 2012-10-07 | Discharge: 2012-10-07 | Disposition: A | Discharge status: Home / Self Care | Payer: PRIVATE HEALTH INSURANCE | Source: Ambulatory Visit | Attending: Gastroenterology | Admitting: Gastroenterology

## 2012-10-07 DIAGNOSIS — R109 Unspecified abdominal pain: Secondary | ICD-10-CM

## 2012-10-07 MED ORDER — IOHEXOL 300 MG/ML  SOLN
100.0000 mL | Freq: Once | INTRAMUSCULAR | Status: AC | PRN
  Administered 2012-10-07: 100 mL via INTRAVENOUS

## 2012-10-12 ENCOUNTER — Telehealth: Payer: Self-pay | Admitting: Gastroenterology

## 2012-10-12 NOTE — Telephone Encounter (Signed)
Notes Recorded by Mardella Layman, MD on 10/06/2012 at 3:32 PM Ferrous sulfate bid for 6 weeks,thn repeat ferritin Normal..Pain Clinic referral..No. Narcotics...does not need GI F/U Informed pt of results and the need to take Ferrous Sulfate twice daily for 6 weeks; reminder in to call pt. Also informed her the CT scan was normal and we could refer her to a pain clinic; pt will call back for the referral. Pt requested we mail her lab and CT results; done.

## 2012-11-27 ENCOUNTER — Telehealth: Payer: Self-pay

## 2012-11-27 DIAGNOSIS — D509 Iron deficiency anemia, unspecified: Secondary | ICD-10-CM

## 2012-11-27 NOTE — Telephone Encounter (Signed)
Message copied by Annett Fabian on Fri Nov 27, 2012  3:59 PM ------      Message from: Florene Glen      Created: Mon Oct 12, 2012  4:43 PM       Call pt to ck ferritin level per 10/12/12 note ------

## 2012-11-27 NOTE — Telephone Encounter (Signed)
I spoke with the Patient and  advised she is due for repeat Ferritin.  She will come one day next week for labs

## 2012-12-03 ENCOUNTER — Other Ambulatory Visit (INDEPENDENT_AMBULATORY_CARE_PROVIDER_SITE_OTHER): Payer: PRIVATE HEALTH INSURANCE

## 2012-12-03 DIAGNOSIS — D509 Iron deficiency anemia, unspecified: Secondary | ICD-10-CM

## 2013-01-20 ENCOUNTER — Emergency Department (HOSPITAL_COMMUNITY): Payer: PRIVATE HEALTH INSURANCE

## 2013-01-20 ENCOUNTER — Other Ambulatory Visit: Payer: Self-pay

## 2013-01-20 ENCOUNTER — Emergency Department (HOSPITAL_COMMUNITY)
Admission: EM | Admit: 2013-01-20 | Discharge: 2013-01-21 | Disposition: A | Discharge status: Home / Self Care | Payer: PRIVATE HEALTH INSURANCE | Attending: Emergency Medicine | Admitting: Emergency Medicine

## 2013-01-20 ENCOUNTER — Encounter (HOSPITAL_COMMUNITY): Payer: Self-pay | Admitting: Nurse Practitioner

## 2013-01-20 DIAGNOSIS — K2270 Barrett's esophagus without dysplasia: Secondary | ICD-10-CM | POA: Insufficient documentation

## 2013-01-20 DIAGNOSIS — J42 Unspecified chronic bronchitis: Secondary | ICD-10-CM | POA: Insufficient documentation

## 2013-01-20 DIAGNOSIS — Z8739 Personal history of other diseases of the musculoskeletal system and connective tissue: Secondary | ICD-10-CM | POA: Insufficient documentation

## 2013-01-20 DIAGNOSIS — I252 Old myocardial infarction: Secondary | ICD-10-CM | POA: Insufficient documentation

## 2013-01-20 DIAGNOSIS — S199XXA Unspecified injury of neck, initial encounter: Secondary | ICD-10-CM | POA: Insufficient documentation

## 2013-01-20 DIAGNOSIS — K219 Gastro-esophageal reflux disease without esophagitis: Secondary | ICD-10-CM | POA: Insufficient documentation

## 2013-01-20 DIAGNOSIS — Y939 Activity, unspecified: Secondary | ICD-10-CM | POA: Insufficient documentation

## 2013-01-20 DIAGNOSIS — S0993XA Unspecified injury of face, initial encounter: Secondary | ICD-10-CM | POA: Insufficient documentation

## 2013-01-20 DIAGNOSIS — M542 Cervicalgia: Secondary | ICD-10-CM

## 2013-01-20 DIAGNOSIS — Y929 Unspecified place or not applicable: Secondary | ICD-10-CM | POA: Insufficient documentation

## 2013-01-20 DIAGNOSIS — W19XXXA Unspecified fall, initial encounter: Secondary | ICD-10-CM | POA: Insufficient documentation

## 2013-01-20 DIAGNOSIS — Z8719 Personal history of other diseases of the digestive system: Secondary | ICD-10-CM | POA: Insufficient documentation

## 2013-01-20 DIAGNOSIS — Z79899 Other long term (current) drug therapy: Secondary | ICD-10-CM | POA: Insufficient documentation

## 2013-01-20 DIAGNOSIS — Z8659 Personal history of other mental and behavioral disorders: Secondary | ICD-10-CM | POA: Insufficient documentation

## 2013-01-20 DIAGNOSIS — F17200 Nicotine dependence, unspecified, uncomplicated: Secondary | ICD-10-CM | POA: Insufficient documentation

## 2013-01-20 LAB — CBC WITH DIFFERENTIAL/PLATELET
Basophils Absolute: 0 10*3/uL (ref 0.0–0.1)
Basophils Relative: 0 % (ref 0–1)
Eosinophils Absolute: 0.1 10*3/uL (ref 0.0–0.7)
MCHC: 33.9 g/dL (ref 30.0–36.0)
Monocytes Absolute: 0.6 10*3/uL (ref 0.1–1.0)
Neutro Abs: 6.2 10*3/uL (ref 1.7–7.7)
Neutrophils Relative %: 62 % (ref 43–77)
RDW: 13.9 % (ref 11.5–15.5)

## 2013-01-20 LAB — BASIC METABOLIC PANEL
Chloride: 101 mEq/L (ref 96–112)
Creatinine, Ser: 0.82 mg/dL (ref 0.50–1.10)
GFR calc Af Amer: >90 mL/min (ref >90)
GFR calc non Af Amer: 79 mL/min — ABNORMAL LOW (ref >90)
Potassium: 4.2 mEq/L (ref 3.5–5.1)

## 2013-01-20 MED ORDER — SODIUM CHLORIDE 0.9 % IV SOLN
Freq: Once | INTRAVENOUS | Status: AC
  Administered 2013-01-20: 21:00:00 via INTRAVENOUS

## 2013-01-20 MED ORDER — HYDROMORPHONE HCL PF 1 MG/ML IJ SOLN
1.0000 mg | Freq: Once | INTRAMUSCULAR | Status: AC
  Administered 2013-01-20: 1 mg via INTRAVENOUS
  Filled 2013-01-20: qty 1

## 2013-01-20 MED ORDER — ONDANSETRON HCL 4 MG/2ML IJ SOLN
4.0000 mg | Freq: Once | INTRAMUSCULAR | Status: AC
  Administered 2013-01-20: 4 mg via INTRAVENOUS
  Filled 2013-01-20: qty 2

## 2013-01-20 MED ORDER — PANTOPRAZOLE SODIUM 40 MG IV SOLR
40.0000 mg | Freq: Once | INTRAVENOUS | Status: AC
  Administered 2013-01-20: 40 mg via INTRAVENOUS
  Filled 2013-01-20: qty 40

## 2013-01-20 NOTE — ED Notes (Signed)
Pt given crackers, peanut butter, and a can of coca-cola

## 2013-01-20 NOTE — ED Notes (Signed)
Bed:WA25<BR> Expected date:<BR> Expected time:<BR> Means of arrival:<BR> Comments:<BR> ems

## 2013-01-20 NOTE — ED Notes (Signed)
Pt at CT

## 2013-01-20 NOTE — ED Provider Notes (Signed)
History     CSN: [id number]  Arrival date & time 01/20/13  1859   First MD Initiated Contact with Patient 01/20/13 2003      Chief Complaint  Patient presents with  . Generalized Body Aches    (Consider location/radiation/quality/duration/timing/severity/associated sxs/prior treatment) HPI  Pt is a 57 yo F PMHx significant for anterior cervical fusion presenting to ED after fall this evening that she attributes to generalized fatigue w/ sensation of "her legs giving out." Patient states she fell on her neck, but did not lose consciousness or hit her head. She tried to get up several times, but was too weak to do so, so remained on ground until husband got home to help her off ground. States pain in neck is sharp constant 10/10 pain with radiation down back, arms, and legs. Denies fevers, chills, palpitations, CP, SOB.    Past Medical History  Diagnosis Date  . COPD (chronic obstructive pulmonary disease)   . Acute MI     x3  . Asthma   . Chronic bronchitis   . Barrett esophagus 2007  . Hiatal hernia [id number]  . Duodenitis without mention of hemorrhage 2007  . Esophageal stricture   . Esophageal reflux 2007  . Anxiety   . Depression   . Multiple fractures     from falls, fx rt. elbow, fx left wrist, bilateral ankles  . Allergy   . Ulcer     Past Surgical History  Procedure Laterality Date  . Abdominal hysterectomy    . Neck surgery    . Cholecystectomy    . Elbow fracture surgery      right  . Wrist fracture surgery      left, has plate  . Colonoscopy      Family History  Problem Relation Age of Onset  . Emphysema Father   . Colon cancer Neg Hx   . Diabetes Maternal Grandmother   . Diabetes Maternal Aunt   . Diabetes      mat. cousin  . Heart disease Brother     History  Substance Use Topics  . Smoking status: Current Every Day Smoker -- 0.50 packs/day for 21 years    Types: Cigarettes  . Smokeless tobacco: Never Used     Comment: tobaccco info given  03/06/12  . Alcohol Use: No    OB History   Grav Para Term Preterm Abortions TAB SAB Ect Mult Living                  Review of Systems  Constitutional: Positive for fatigue.  HENT: Positive for neck pain and neck stiffness.   Eyes: Negative for visual disturbance.  Respiratory: Negative for shortness of breath.   Gastrointestinal: Positive for nausea and vomiting.  Genitourinary: Negative for dysuria.  Musculoskeletal: Positive for back pain.  Skin: Negative.   Neurological: Positive for weakness and numbness.    Allergies  Codeine; Propoxyphene-acetaminophen; and Hydrocodone-acetaminophen  Home Medications   Current Outpatient Rx  Name  Route  Sig  Dispense  Refill  . cholecalciferol (VITAMIN D) 1000 UNITS tablet   Oral   Take 1,000 Units by mouth daily.         . cyclobenzaprine (FLEXERIL) 10 MG tablet   Oral   Take 10 mg by mouth every 8 (eight) hours as needed. For muscle spasm.         . esomeprazole (NEXIUM) 40 MG capsule   Oral   Take 40 mg by mouth daily before breakfast.         .  montelukast (SINGULAIR) 10 MG tablet   Oral   Take 10 mg by mouth at bedtime.         . nitroGLYCERIN (NITROSTAT) 0.4 MG SL tablet   Sublingual   Place 0.4 mg under the tongue every 5 (five) minutes as needed. For chest pain.         Marland Kitchen PRESCRIPTION MEDICATION      Allergy shots for mold, cat, and dog , one shot every month in each arm , Given at PCP         . valACYclovir (VALTREX) 500 MG tablet   Oral   Take 500 mg by mouth 2 (two) times daily.         . ondansetron (ZOFRAN) 4 MG tablet   Oral   Take 1 tablet (4 mg total) by mouth every 8 (eight) hours as needed for nausea.   12 tablet   0   . oxyCODONE-acetaminophen (PERCOCET/ROXICET) 5-325 MG per tablet   Oral   Take 1-2 tablets by mouth every 4 (four) hours as needed for pain.   15 tablet   0     BP 110/64  Pulse 88  Temp(Src) 97.6 F (36.4 C) (Oral)  Resp 14  Ht 5\' 4"  (1.626 m)  Wt 125  lb (56.7 kg)  BMI 21.45 kg/m2  SpO2 96%  Physical Exam  Constitutional: She is oriented to person, place, and time. She appears well-developed and well-nourished. No distress.  HENT:  Head: Normocephalic and atraumatic.  Mouth/Throat: Oropharynx is clear and moist.  Eyes: EOM are normal. Pupils are equal, round, and reactive to light.  Neck: Normal range of motion. Neck supple. No tracheal deviation present.  Cardiovascular: Normal rate, regular rhythm and normal heart sounds.   Pulmonary/Chest: Effort normal and breath sounds normal.  Abdominal: Soft. Bowel sounds are normal. There is no tenderness.  Lymphadenopathy:    She has no cervical adenopathy.  Neurological: She is alert and oriented to person, place, and time. No cranial nerve deficit.  Unable to assess strength   Skin: Skin is warm and dry. She is not diaphoretic.  Psychiatric: She has a normal mood and affect.    ED Course  Procedures (including critical care time)   Date: 01/20/2013  Rate: 80  Rhythm: normal sinus rhythm  QRS Axis: normal  Intervals: normal  ST/T Wave abnormalities: normal  Conduction Disutrbances:none  Narrative Interpretation:   Old EKG Reviewed: unchanged  Medications  HYDROmorphone (DILAUDID) injection 1 mg (1 mg Intravenous Given 01/20/13 2130)  ondansetron (ZOFRAN) injection 4 mg (4 mg Intravenous Given 01/20/13 2128)  0.9 %  sodium chloride infusion ( Intravenous Stopped 01/21/13 0035)  HYDROmorphone (DILAUDID) injection 1 mg (1 mg Intravenous Given 01/20/13 2309)  pantoprazole (PROTONIX) injection 40 mg (40 mg Intravenous Given 01/20/13 2311)   C-spine cleared and collar removed after negative CT cervical neck result.  On re-examination after pain management patient has 5/5 strength bilateral UE and LE extremities. Able to ambulate in ED.  Tolerated PO intake.  Pain managed in ED  Labs Reviewed  BASIC METABOLIC PANEL - Abnormal; Notable for the following:    Sodium 134 (*)    GFR calc non  Af Amer 79 (*)    All other components within normal limits  URINALYSIS, ROUTINE W REFLEX MICROSCOPIC - Abnormal; Notable for the following:    Bilirubin Urine SMALL (*)    Leukocytes, UA TRACE (*)    All other components within normal limits  CBC WITH DIFFERENTIAL  URINE MICROSCOPIC-ADD ON   Ct Head Wo Contrast  01/20/2013  *RADIOLOGY REPORT*  Clinical Data:  Fall, leg weakness  CT HEAD WITHOUT CONTRAST CT CERVICAL SPINE WITHOUT CONTRAST  Technique:  Multidetector CT imaging of the head and cervical spine was performed following the standard protocol without intravenous contrast.  Multiplanar CT image reconstructions of the cervical spine were also generated.  Comparison:   Plain films of cervical spine 05/05/2011  CT HEAD  Findings: No acute intracranial hemorrhage.  No focal mass lesion. No CT evidence of acute infarction.   No midline shift or mass effect.  No hydrocephalus.  Basilar cisterns are patent.  There is scattered opacification of the ethmoid air cells. Paranasal sinuses are clear.  Frontal sinuses clear.  No skull fracture.  IMPRESSION: No intracranial trauma.  Fluid in the mastoid air cells.  No skull fracture.  CT CERVICAL SPINE  Findings: There is anterior cervical fusion from C3-C5.  There is a bony bridging of the cervical spine from C3-C7.  No evidence of acute fracture.  Normal facet articulation.  Normal craniocervical junction.  There is no prevertebral soft tissue swelling.  No evidence of epidural or paraspinal hematoma.  IMPRESSION: 1.  No evidence for cervical spine fracture. 2.  Stable anterior cervical fusion.   Original Report Authenticated By: Genevive Bi, M.D.    Ct Cervical Spine Wo Contrast  01/20/2013  *RADIOLOGY REPORT*  Clinical Data:  Fall, leg weakness  CT HEAD WITHOUT CONTRAST CT CERVICAL SPINE WITHOUT CONTRAST  Technique:  Multidetector CT imaging of the head and cervical spine was performed following the standard protocol without intravenous contrast.   Multiplanar CT image reconstructions of the cervical spine were also generated.  Comparison:   Plain films of cervical spine 05/05/2011  CT HEAD  Findings: No acute intracranial hemorrhage.  No focal mass lesion. No CT evidence of acute infarction.   No midline shift or mass effect.  No hydrocephalus.  Basilar cisterns are patent.  There is scattered opacification of the ethmoid air cells. Paranasal sinuses are clear.  Frontal sinuses clear.  No skull fracture.  IMPRESSION: No intracranial trauma.  Fluid in the mastoid air cells.  No skull fracture.  CT CERVICAL SPINE  Findings: There is anterior cervical fusion from C3-C5.  There is a bony bridging of the cervical spine from C3-C7.  No evidence of acute fracture.  Normal facet articulation.  Normal craniocervical junction.  There is no prevertebral soft tissue swelling.  No evidence of epidural or paraspinal hematoma.  IMPRESSION: 1.  No evidence for cervical spine fracture. 2.  Stable anterior cervical fusion.   Original Report Authenticated By: Genevive Bi, M.D.      1. Neck pain       MDM  The patient has neck pain s/p fall this afternoon. There is mild tenderness on palpation of the cervical spine and no step-offs. The patient can look to the left and right voluntarily without pain and flex and extend the neck without pain. Cervical collar cleared after CT cervical spine resulted w/o acute findings. Pt pain managed in ED. Labs, UA, EKG, and imaging wnl. Pt no neurofocal deficits on examination. Able to ambulated in ED. No concern for cardiac or neurologic reason for fall after negative work up. Pt advised to follow up with PCP. Return precautions given. Patient agreeable to plan. Patient d/w with Dr. Juleen China, agrees with plan. Patient is stable at time of discharge.           Victorino Dike  L Margia Wiesen, PA-C 01/21/13 0050

## 2013-01-20 NOTE — ED Notes (Signed)
Per EMS:  Pt had a fall this evening just after weakness of legs...she fell but no loss of consciousness.  No injuries noted.  Increased pain with fall.  Warm to touch. Hx: back pain

## 2013-01-21 LAB — URINALYSIS, ROUTINE W REFLEX MICROSCOPIC
Ketones, ur: NEGATIVE mg/dL
Nitrite: NEGATIVE
Urobilinogen, UA: 0.2 mg/dL (ref 0.0–1.0)
pH: 5.5 (ref 5.0–8.0)

## 2013-01-21 LAB — URINE MICROSCOPIC-ADD ON

## 2013-01-21 MED ORDER — OXYCODONE-ACETAMINOPHEN 5-325 MG PO TABS: 1.0000 | ORAL_TABLET | ORAL | Status: DC | PRN

## 2013-01-21 MED ORDER — ONDANSETRON HCL 4 MG PO TABS: 4.0000 mg | ORAL_TABLET | Freq: Three times a day (TID) | ORAL | Status: DC | PRN

## 2013-01-26 NOTE — ED Provider Notes (Signed)
Medical screening examination/treatment/procedure(s) were performed by non-physician practitioner and as supervising physician I was immediately available for consultation/collaboration.  Raeford Razor, MD 01/26/13 [phone]

## 2013-02-26 ENCOUNTER — Emergency Department (HOSPITAL_COMMUNITY)
Admission: EM | Admit: 2013-02-26 | Discharge: 2013-02-26 | Disposition: A | Discharge status: Home / Self Care | Payer: PRIVATE HEALTH INSURANCE | Attending: Emergency Medicine | Admitting: Emergency Medicine

## 2013-02-26 ENCOUNTER — Emergency Department (HOSPITAL_COMMUNITY): Payer: PRIVATE HEALTH INSURANCE

## 2013-02-26 ENCOUNTER — Encounter (HOSPITAL_COMMUNITY): Payer: Self-pay | Admitting: Emergency Medicine

## 2013-02-26 DIAGNOSIS — J449 Chronic obstructive pulmonary disease, unspecified: Secondary | ICD-10-CM | POA: Insufficient documentation

## 2013-02-26 DIAGNOSIS — Z7982 Long term (current) use of aspirin: Secondary | ICD-10-CM | POA: Insufficient documentation

## 2013-02-26 DIAGNOSIS — F17200 Nicotine dependence, unspecified, uncomplicated: Secondary | ICD-10-CM | POA: Insufficient documentation

## 2013-02-26 DIAGNOSIS — Y9289 Other specified places as the place of occurrence of the external cause: Secondary | ICD-10-CM | POA: Insufficient documentation

## 2013-02-26 DIAGNOSIS — R42 Dizziness and giddiness: Secondary | ICD-10-CM | POA: Insufficient documentation

## 2013-02-26 DIAGNOSIS — K222 Esophageal obstruction: Secondary | ICD-10-CM | POA: Insufficient documentation

## 2013-02-26 DIAGNOSIS — R519 Headache, unspecified: Secondary | ICD-10-CM

## 2013-02-26 DIAGNOSIS — Z872 Personal history of diseases of the skin and subcutaneous tissue: Secondary | ICD-10-CM | POA: Insufficient documentation

## 2013-02-26 DIAGNOSIS — F411 Generalized anxiety disorder: Secondary | ICD-10-CM | POA: Insufficient documentation

## 2013-02-26 DIAGNOSIS — I252 Old myocardial infarction: Secondary | ICD-10-CM | POA: Insufficient documentation

## 2013-02-26 DIAGNOSIS — K2270 Barrett's esophagus without dysplasia: Secondary | ICD-10-CM | POA: Insufficient documentation

## 2013-02-26 DIAGNOSIS — J4489 Other specified chronic obstructive pulmonary disease: Secondary | ICD-10-CM | POA: Insufficient documentation

## 2013-02-26 DIAGNOSIS — W1809XA Striking against other object with subsequent fall, initial encounter: Secondary | ICD-10-CM | POA: Insufficient documentation

## 2013-02-26 DIAGNOSIS — Z9181 History of falling: Secondary | ICD-10-CM | POA: Insufficient documentation

## 2013-02-26 DIAGNOSIS — Z8709 Personal history of other diseases of the respiratory system: Secondary | ICD-10-CM | POA: Insufficient documentation

## 2013-02-26 DIAGNOSIS — Z8781 Personal history of (healed) traumatic fracture: Secondary | ICD-10-CM | POA: Insufficient documentation

## 2013-02-26 DIAGNOSIS — F3289 Other specified depressive episodes: Secondary | ICD-10-CM | POA: Insufficient documentation

## 2013-02-26 DIAGNOSIS — K219 Gastro-esophageal reflux disease without esophagitis: Secondary | ICD-10-CM | POA: Insufficient documentation

## 2013-02-26 DIAGNOSIS — Z91040 Latex allergy status: Secondary | ICD-10-CM | POA: Insufficient documentation

## 2013-02-26 DIAGNOSIS — Z79899 Other long term (current) drug therapy: Secondary | ICD-10-CM | POA: Insufficient documentation

## 2013-02-26 DIAGNOSIS — F329 Major depressive disorder, single episode, unspecified: Secondary | ICD-10-CM | POA: Insufficient documentation

## 2013-02-26 DIAGNOSIS — Y9389 Activity, other specified: Secondary | ICD-10-CM | POA: Insufficient documentation

## 2013-02-26 DIAGNOSIS — Z8719 Personal history of other diseases of the digestive system: Secondary | ICD-10-CM | POA: Insufficient documentation

## 2013-02-26 DIAGNOSIS — S0990XA Unspecified injury of head, initial encounter: Secondary | ICD-10-CM | POA: Insufficient documentation

## 2013-02-26 LAB — URINALYSIS, ROUTINE W REFLEX MICROSCOPIC
Bilirubin Urine: NEGATIVE
Glucose, UA: NEGATIVE mg/dL
Hgb urine dipstick: NEGATIVE
Ketones, ur: NEGATIVE mg/dL
Leukocytes, UA: NEGATIVE
Nitrite: NEGATIVE
Protein, ur: NEGATIVE mg/dL
Specific Gravity, Urine: 1.013 (ref 1.005–1.030)
Urobilinogen, UA: 0.2 mg/dL (ref 0.0–1.0)
pH: 5.5 (ref 5.0–8.0)

## 2013-02-26 LAB — BASIC METABOLIC PANEL
BUN: 4 mg/dL — ABNORMAL LOW (ref 6–23)
CO2: 25 mEq/L (ref 19–32)
Calcium: 9 mg/dL (ref 8.4–10.5)
Chloride: 104 mEq/L (ref 96–112)
Creatinine, Ser: 0.8 mg/dL (ref 0.50–1.10)
GFR calc Af Amer: >90 mL/min (ref >90)
GFR calc non Af Amer: 81 mL/min — ABNORMAL LOW (ref >90)
Glucose, Bld: 92 mg/dL (ref 70–99)
Potassium: 3.9 mEq/L (ref 3.5–5.1)
Sodium: 138 mEq/L (ref 135–145)

## 2013-02-26 LAB — CBC
HCT: 36.2 % (ref 36.0–46.0)
Hemoglobin: 12.5 g/dL (ref 12.0–15.0)
MCH: 28.9 pg (ref 26.0–34.0)
MCHC: 34.5 g/dL (ref 30.0–36.0)
MCV: 83.8 fL (ref 78.0–100.0)
Platelets: 241 10*3/uL (ref 150–400)
RBC: 4.32 MIL/uL (ref 3.87–5.11)
RDW: 13.3 % (ref 11.5–15.5)
WBC: 7.5 10*3/uL (ref 4.0–10.5)

## 2013-02-26 MED ORDER — MECLIZINE HCL 25 MG PO TABS: 25.0000 mg | ORAL_TABLET | Freq: Three times a day (TID) | ORAL | Status: DC | PRN

## 2013-02-26 MED ORDER — PROMETHAZINE HCL 25 MG/ML IJ SOLN
12.5000 mg | Freq: Once | INTRAMUSCULAR | Status: AC
  Administered 2013-02-26: 12.5 mg via INTRAVENOUS
  Filled 2013-02-26: qty 1

## 2013-02-26 MED ORDER — KETOROLAC TROMETHAMINE 30 MG/ML IJ SOLN
30.0000 mg | Freq: Once | INTRAMUSCULAR | Status: AC
Start: 2013-02-26 — End: 2013-02-26
  Administered 2013-02-26: 30 mg via INTRAVENOUS
  Filled 2013-02-26: qty 1

## 2013-02-26 NOTE — ED Notes (Signed)
Patient transported to CT 

## 2013-02-26 NOTE — ED Notes (Signed)
Pt fell six weeks ago and hit her head. Pt has been having headaches, dizziness, weakness ever since.  Pt states last night her head and dizziness got really bad.  And also c/o fever.

## 2013-02-26 NOTE — ED Notes (Signed)
ZOX:[id number]<AV> Expected date:<BR> Expected time:<BR> Means of arrival:<BR> Comments:<BR> dizziness

## 2013-03-02 NOTE — ED Provider Notes (Signed)
History     CSN: [id number]  Arrival date & time 02/26/13  1001   First MD Initiated Contact with Patient 02/26/13 1014      Chief Complaint  Patient presents with  . Dizziness  . Headache    (Consider location/radiation/quality/duration/timing/severity/associated sxs/prior treatment) HPI Patient presents emergency department with continued headache from a fall that occurred 6 weeks ago.  Patient, states, that she's had intermittent headaches, and dizziness since that time.  Patient denies nausea, vomiting, syncope, weakness, blurred vision, chest pain, shortness of breath, fever, back pain, neck pain, or vertigo.  Patient, states she took ibuprofen without relief of her symptoms.  Patient, states her symptoms have been constant.  Patient denies any worsening factors in her headache Past Medical History  Diagnosis Date  . COPD (chronic obstructive pulmonary disease)   . Acute MI     x3  . Asthma   . Chronic bronchitis   . Barrett esophagus 2007  . Hiatal hernia [id number]  . Duodenitis without mention of hemorrhage 2007  . Esophageal stricture   . Esophageal reflux 2007  . Anxiety   . Depression   . Multiple fractures     from falls, fx rt. elbow, fx left wrist, bilateral ankles  . Allergy   . Ulcer     Past Surgical History  Procedure Laterality Date  . Abdominal hysterectomy    . Neck surgery    . Cholecystectomy    . Elbow fracture surgery      right  . Wrist fracture surgery      left, has plate  . Colonoscopy      Family History  Problem Relation Age of Onset  . Emphysema Father   . Colon cancer Neg Hx   . Diabetes Maternal Grandmother   . Diabetes Maternal Aunt   . Diabetes      mat. cousin  . Heart disease Brother     History  Substance Use Topics  . Smoking status: Current Every Day Smoker -- 0.50 packs/day for 21 years    Types: Cigarettes  . Smokeless tobacco: Never Used     Comment: tobaccco info given 03/06/12  . Alcohol Use: No    OB  History   Grav Para Term Preterm Abortions TAB SAB Ect Mult Living                  Review of Systems All other systems negative except as documented in the HPI. All pertinent positives and negatives as reviewed in the HPI.  Allergies  Codeine; Propoxyphene-acetaminophen; Hydrocodone-acetaminophen; and Latex  Home Medications   Current Outpatient Rx  Name  Route  Sig  Dispense  Refill  . Acetaminophen-Codeine (TYLENOL WITH CODEINE #3 PO)   Oral   Take 1 tablet by mouth 2 (two) times daily as needed (For pain.).         Marland Kitchen albuterol (PROAIR HFA) 108 (90 BASE) MCG/ACT inhaler   Inhalation   Inhale 2 puffs into the lungs every 4 (four) hours as needed (For cough and congestion.).         Marland Kitchen ALPRAZolam (XANAX) 0.5 MG tablet   Oral   Take 0.5 mg by mouth 2 (two) times daily as needed for anxiety.         . ALPRAZolam (XANAX) 1 MG tablet   Oral   Take 1 mg by mouth 3 (three) times daily as needed for anxiety.         Marland Kitchen aspirin 81 MG chewable  tablet   Oral   Chew 324 mg by mouth every morning.         . benzonatate (TESSALON) 100 MG capsule   Oral   Take 100 mg by mouth every 6 (six) hours as needed for cough.         . butalbital-acetaminophen-caffeine (FIORICET, ESGIC) 50-325-40 MG per tablet   Oral   Take 1-2 tablets by mouth every 8 (eight) hours as needed for headache.         . cholecalciferol (VITAMIN D) 1000 UNITS tablet   Oral   Take 1,000 Units by mouth every morning.          . Choline Fenofibrate (FENOFIBRIC ACID) 135 MG CPDR   Oral   Take 135 mg by mouth every morning.         . cyclobenzaprine (FLEXERIL) 10 MG tablet   Oral   Take 10 mg by mouth every 8 (eight) hours as needed. For muscle spasm.         Marland Kitchen dexlansoprazole (DEXILANT) 60 MG capsule   Oral   Take 60 mg by mouth at bedtime.         . docusate sodium (COLACE) 100 MG capsule   Oral   Take 100-200 mg by mouth daily as needed for constipation.         Marland Kitchen EPINEPHrine  (EPIPEN) 0.3 mg/0.3 mL DEVI   Intramuscular   Inject 0.3 mg into the muscle once. For bee stings.         . fluticasone (FLONASE) 50 MCG/ACT nasal spray   Nasal   Place 2 sprays into the nose daily as needed for allergies.         . Hydrocortisone-Aloe Vera (CORTIZONE 10 PLUS) 1 % CREA   Topical   Apply 1 application topically daily as needed (For itching.).         Marland Kitchen hydrOXYzine (ATARAX/VISTARIL) 25 MG tablet   Oral   Take 25 mg by mouth every 8 (eight) hours as needed for itching.         . hydrOXYzine (ATARAX/VISTARIL) 25 MG tablet   Oral   Take 25 mg by mouth at bedtime.         Marland Kitchen ibuprofen (ADVIL,MOTRIN) 600 MG tablet   Oral   Take 600 mg by mouth every 8 (eight) hours as needed for pain.         Marland Kitchen loperamide (IMODIUM) 2 MG capsule   Oral   Take 2 mg by mouth every 8 (eight) hours as needed for diarrhea or loose stools.         . meclizine (ANTIVERT) 25 MG tablet   Oral   Take 25 mg by mouth 2 (two) times daily as needed for dizziness.         . montelukast (SINGULAIR) 10 MG tablet   Oral   Take 10 mg by mouth at bedtime.         . nitroGLYCERIN (NITROSTAT) 0.4 MG SL tablet   Sublingual   Place 0.4 mg under the tongue every 5 (five) minutes as needed. For chest pain.         Marland Kitchen ondansetron (ZOFRAN) 4 MG tablet   Oral   Take 1 tablet (4 mg total) by mouth every 8 (eight) hours as needed for nausea.   12 tablet   0   . OVER THE COUNTER MEDICATION   Topical   Apply 1 application topically daily as needed (For itching.). Diphenhydramine Hydrochloride  1% Anti-itch Cream.         . promethazine (PHENERGAN) 25 MG tablet   Oral   Take 25 mg by mouth every 6 (six) hours as needed for nausea.         . theophylline (THEO-24) 100 MG 24 hr capsule   Oral   Take 100 mg by mouth every morning.         . traMADol (ULTRAM) 50 MG tablet   Oral   Take 50 mg by mouth 2 (two) times daily as needed for pain.         Marland Kitchen triamcinolone cream  (KENALOG) 0.1 %   Topical   Apply 1 application topically 2 (two) times daily. For rash.         . Trospium Chloride 60 MG CP24   Oral   Take 60 mg by mouth at bedtime.         . valACYclovir (VALTREX) 500 MG tablet   Oral   Take 500 mg by mouth 2 (two) times daily.         . meclizine (ANTIVERT) 25 MG tablet   Oral   Take 1 tablet (25 mg total) by mouth 3 (three) times daily as needed.   30 tablet   0     BP 106/55  Pulse 87  Temp(Src) 97.8 F (36.6 C) (Oral)  Resp 20  SpO2 99%  Physical Exam  Nursing note and vitals reviewed. Constitutional: She is oriented to person, place, and time. She appears well-developed and well-nourished. No distress.  HENT:  Head: Normocephalic and atraumatic.  Mouth/Throat: Oropharynx is clear and moist.  Neck: Normal range of motion. Neck supple.  Cardiovascular: Normal rate, regular rhythm and normal heart sounds.  Exam reveals no gallop and no friction rub.   No murmur heard. Pulmonary/Chest: Effort normal and breath sounds normal. No respiratory distress.  Abdominal: Soft. Bowel sounds are normal. She exhibits no distension. There is no tenderness.  Musculoskeletal: She exhibits no edema.  Lymphadenopathy:    She has no cervical adenopathy.  Neurological: She is alert and oriented to person, place, and time. She has normal reflexes. No cranial nerve deficit. She exhibits normal muscle tone. Coordination normal.  Skin: Skin is warm and dry. No rash noted. No erythema.    ED Course  Procedures (including critical care time)  Labs Reviewed  BASIC METABOLIC PANEL - Abnormal; Notable for the following:    BUN 4 (*)    GFR calc non Af Amer 81 (*)    All other components within normal limits  URINALYSIS, ROUTINE W REFLEX MICROSCOPIC - Abnormal; Notable for the following:    APPearance CLOUDY (*)    All other components within normal limits  CBC    1. Headache    Patient does not have any neurological deficits noted on exam,  and normal motor function.  The patient is advised followup with her primary care Dr. she's told to return here as needed.  All of her testing results were explained to the patient and results.  Patient is a postconcussive symptoms.  Patient is feeling better following medications and would like to go home.  She is told to return here as needed   MDM  MDM Reviewed: nursing note, vitals and previous chart Reviewed previous: labs and CT scan Interpretation: labs and CT scan            Carlyle Dolly, PA-C 03/03/13 0101

## 2013-03-07 NOTE — ED Provider Notes (Signed)
Medical screening examination/treatment/procedure(s) were performed by non-physician practitioner and as supervising physician I was immediately available for consultation/collaboration.   Ethal Gotay E Riely Baskett, MD 03/07/13 1102 

## 2013-04-08 ENCOUNTER — Emergency Department (HOSPITAL_COMMUNITY)
Admission: EM | Admit: 2013-04-08 | Discharge: 2013-04-08 | Disposition: A | Discharge status: Home / Self Care | Payer: PRIVATE HEALTH INSURANCE | Attending: Emergency Medicine | Admitting: Emergency Medicine

## 2013-04-08 ENCOUNTER — Encounter (HOSPITAL_COMMUNITY): Payer: Self-pay | Admitting: Emergency Medicine

## 2013-04-08 DIAGNOSIS — S8990XA Unspecified injury of unspecified lower leg, initial encounter: Secondary | ICD-10-CM | POA: Insufficient documentation

## 2013-04-08 DIAGNOSIS — J4489 Other specified chronic obstructive pulmonary disease: Secondary | ICD-10-CM | POA: Insufficient documentation

## 2013-04-08 DIAGNOSIS — F329 Major depressive disorder, single episode, unspecified: Secondary | ICD-10-CM | POA: Insufficient documentation

## 2013-04-08 DIAGNOSIS — Y929 Unspecified place or not applicable: Secondary | ICD-10-CM | POA: Insufficient documentation

## 2013-04-08 DIAGNOSIS — F3289 Other specified depressive episodes: Secondary | ICD-10-CM | POA: Insufficient documentation

## 2013-04-08 DIAGNOSIS — S99929A Unspecified injury of unspecified foot, initial encounter: Secondary | ICD-10-CM | POA: Insufficient documentation

## 2013-04-08 DIAGNOSIS — J449 Chronic obstructive pulmonary disease, unspecified: Secondary | ICD-10-CM | POA: Insufficient documentation

## 2013-04-08 DIAGNOSIS — Z862 Personal history of diseases of the blood and blood-forming organs and certain disorders involving the immune mechanism: Secondary | ICD-10-CM | POA: Insufficient documentation

## 2013-04-08 DIAGNOSIS — F411 Generalized anxiety disorder: Secondary | ICD-10-CM | POA: Insufficient documentation

## 2013-04-08 DIAGNOSIS — Z79899 Other long term (current) drug therapy: Secondary | ICD-10-CM | POA: Insufficient documentation

## 2013-04-08 DIAGNOSIS — Z8781 Personal history of (healed) traumatic fracture: Secondary | ICD-10-CM | POA: Insufficient documentation

## 2013-04-08 DIAGNOSIS — Z91040 Latex allergy status: Secondary | ICD-10-CM | POA: Insufficient documentation

## 2013-04-08 DIAGNOSIS — Z8639 Personal history of other endocrine, nutritional and metabolic disease: Secondary | ICD-10-CM | POA: Insufficient documentation

## 2013-04-08 DIAGNOSIS — Y9389 Activity, other specified: Secondary | ICD-10-CM | POA: Insufficient documentation

## 2013-04-08 DIAGNOSIS — Z872 Personal history of diseases of the skin and subcutaneous tissue: Secondary | ICD-10-CM | POA: Insufficient documentation

## 2013-04-08 DIAGNOSIS — M79604 Pain in right leg: Secondary | ICD-10-CM

## 2013-04-08 DIAGNOSIS — F17200 Nicotine dependence, unspecified, uncomplicated: Secondary | ICD-10-CM | POA: Insufficient documentation

## 2013-04-08 DIAGNOSIS — Z8719 Personal history of other diseases of the digestive system: Secondary | ICD-10-CM | POA: Insufficient documentation

## 2013-04-08 DIAGNOSIS — W19XXXA Unspecified fall, initial encounter: Secondary | ICD-10-CM | POA: Insufficient documentation

## 2013-04-08 DIAGNOSIS — I252 Old myocardial infarction: Secondary | ICD-10-CM | POA: Insufficient documentation

## 2013-04-08 LAB — BASIC METABOLIC PANEL
CO2: 24 mEq/L (ref 19–32)
GFR calc non Af Amer: 70 mL/min — ABNORMAL LOW (ref >90)
Glucose, Bld: 96 mg/dL (ref 70–99)
Potassium: 4 mEq/L (ref 3.5–5.1)
Sodium: 136 mEq/L (ref 135–145)

## 2013-04-08 LAB — CBC WITH DIFFERENTIAL/PLATELET
Eosinophils Absolute: 0.1 10*3/uL (ref 0.0–0.7)
Lymphocytes Relative: 37 % (ref 12–46)
Lymphs Abs: 2.9 10*3/uL (ref 0.7–4.0)
Neutro Abs: 4.2 10*3/uL (ref 1.7–7.7)
Neutrophils Relative %: 55 % (ref 43–77)
Platelets: 335 10*3/uL (ref 150–400)
RBC: 4.3 MIL/uL (ref 3.87–5.11)
WBC: 7.7 10*3/uL (ref 4.0–10.5)

## 2013-04-08 MED ORDER — CYCLOBENZAPRINE HCL 10 MG PO TABS: 10.0000 mg | ORAL_TABLET | Freq: Three times a day (TID) | ORAL | Status: DC | PRN

## 2013-04-08 MED ORDER — ONDANSETRON HCL 4 MG/2ML IJ SOLN
4.0000 mg | Freq: Once | INTRAMUSCULAR | Status: AC
Start: 2013-04-08 — End: 2013-04-08
  Administered 2013-04-08: 4 mg via INTRAVENOUS
  Filled 2013-04-08: qty 2

## 2013-04-08 MED ORDER — MORPHINE SULFATE 4 MG/ML IJ SOLN
4.0000 mg | Freq: Once | INTRAMUSCULAR | Status: AC
  Administered 2013-04-08: 4 mg via INTRAVENOUS
  Filled 2013-04-08: qty 1

## 2013-04-08 MED ORDER — TRAMADOL HCL 50 MG PO TABS: 50.0000 mg | ORAL_TABLET | Freq: Four times a day (QID) | ORAL | Status: DC | PRN

## 2013-04-08 MED ORDER — DIPHENHYDRAMINE HCL 50 MG/ML IJ SOLN
25.0000 mg | Freq: Once | INTRAMUSCULAR | Status: AC
  Administered 2013-04-08: 25 mg via INTRAVENOUS
  Filled 2013-04-08: qty 1

## 2013-04-08 NOTE — ED Notes (Signed)
Patient's upper right arm reddened and itching.  PA notified.  Benadryl 25mg . Ordered and given.

## 2013-04-08 NOTE — ED Provider Notes (Signed)
Medical screening examination/treatment/procedure(s) were performed by non-physician practitioner and as supervising physician I was immediately available for consultation/collaboration.  Zuleima Haser L Makalynn Berwanger, MD 04/08/13 2349 

## 2013-04-08 NOTE — ED Provider Notes (Signed)
CSN: [id number]     Arrival date & time 04/08/13  1508 History     First MD Initiated Contact with Patient 04/08/13 1614     Chief Complaint  Patient presents with  . Leg Pain   (Consider location/radiation/quality/duration/timing/severity/associated sxs/prior Treatment) HPI Comments: Patient is a 57 year old female with a past medical history of asthma, COPD, and hyperlipidemia who presents with bilateral leg pain for the past 2 months. Patient states the symptoms started immediately after she fell 2 weeks ago and hurt her legs. The pain is aching and severe without radiation. The pain is located in both of her thighs. Patient has not tried anything for pain. Palpation of the affected area makes the pain worse. No alleviating factors.    Past Medical History  Diagnosis Date  . COPD (chronic obstructive pulmonary disease)   . Acute MI     x3  . Asthma   . Chronic bronchitis   . Barrett esophagus 2007  . Hiatal hernia [id number]  . Duodenitis without mention of hemorrhage 2007  . Esophageal stricture   . Esophageal reflux 2007  . Anxiety   . Depression   . Multiple fractures     from falls, fx rt. elbow, fx left wrist, bilateral ankles  . Allergy   . Ulcer    Past Surgical History  Procedure Laterality Date  . Abdominal hysterectomy    . Neck surgery    . Cholecystectomy    . Elbow fracture surgery      right  . Wrist fracture surgery      left, has plate  . Colonoscopy     Family History  Problem Relation Age of Onset  . Emphysema Father   . Colon cancer Neg Hx   . Diabetes Maternal Grandmother   . Diabetes Maternal Aunt   . Diabetes      mat. cousin  . Heart disease Brother    History  Substance Use Topics  . Smoking status: Current Every Day Smoker -- 0.50 packs/day for 21 years    Types: Cigarettes  . Smokeless tobacco: Never Used     Comment: tobaccco info given 03/06/12  . Alcohol Use: No   OB History   Grav Para Term Preterm Abortions TAB SAB Ect  Mult Living                 Review of Systems  Allergies  Codeine; Propoxyphene-acetaminophen; Toradol; Hydrocodone-acetaminophen; and Latex  Home Medications   Current Outpatient Rx  Name  Route  Sig  Dispense  Refill  . albuterol (PROAIR HFA) 108 (90 BASE) MCG/ACT inhaler   Inhalation   Inhale 2 puffs into the lungs every 4 (four) hours as needed for wheezing or shortness of breath.          . ALPRAZolam (XANAX) 1 MG tablet   Oral   Take 1 mg by mouth 3 (three) times daily as needed for anxiety.         . benzonatate (TESSALON) 100 MG capsule   Oral   Take 100 mg by mouth every 6 (six) hours as needed for cough.         . butalbital-acetaminophen-caffeine (FIORICET, ESGIC) 50-325-40 MG per tablet   Oral   Take 1-2 tablets by mouth every 8 (eight) hours as needed for headache.         . docusate sodium (COLACE) 100 MG capsule   Oral   Take 100-200 mg by mouth daily as needed for  constipation.         . meclizine (ANTIVERT) 25 MG tablet   Oral   Take 25 mg by mouth 2 (two) times daily as needed for dizziness.         . montelukast (SINGULAIR) 10 MG tablet   Oral   Take 10 mg by mouth at bedtime.         . naproxen (NAPROSYN) 500 MG tablet   Oral   Take 500 mg by mouth 2 (two) times daily as needed (for pain).         . nitroGLYCERIN (NITROSTAT) 0.4 MG SL tablet   Sublingual   Place 0.4 mg under the tongue every 5 (five) minutes as needed for chest pain.          Marland Kitchen sulfamethoxazole-trimethoprim (BACTRIM,SEPTRA) 200-40 MG/5ML suspension   Oral   Take 20 mLs by mouth 2 (two) times daily.         Marland Kitchen EPINEPHrine (EPIPEN) 0.3 mg/0.3 mL DEVI   Intramuscular   Inject 0.3 mg into the muscle once. For bee stings.          BP 123/97  Pulse 80  Temp(Src) 98.7 F (37.1 C) (Oral)  Resp 23  SpO2 99% Physical Exam  Nursing note and vitals reviewed. Constitutional: She is oriented to person, place, and time. She appears well-developed and  well-nourished. No distress.  HENT:  Head: Normocephalic and atraumatic.  Eyes: Conjunctivae are normal.  Neck: Normal range of motion.  Cardiovascular: Normal rate and regular rhythm.  Exam reveals no gallop and no friction rub.   No murmur heard. Pulmonary/Chest: Effort normal and breath sounds normal. She has no wheezes. She has no rales. She exhibits no tenderness.  Abdominal: Soft. She exhibits no distension. There is no tenderness. There is no rebound.  Musculoskeletal: Normal range of motion.  Anterior thigh tenderness to palpation bilaterally. Full ROM of hips and knees bilaterally. No obvious deformity. No lower leg edema noted.   Neurological: She is alert and oriented to person, place, and time. Coordination normal.  Lower extremity strength and sensation equal and intact bilaterally. Speech is goal-oriented. Moves limbs without ataxia.   Skin: Skin is warm and dry.  Psychiatric: She has a normal mood and affect. Her behavior is normal.    ED Course   Procedures (including critical care time)  Labs Reviewed  BASIC METABOLIC PANEL - Abnormal; Notable for the following:    GFR calc non Af Amer 70 (*)    GFR calc Af Amer 81 (*)    All other components within normal limits  CBC WITH DIFFERENTIAL   No results found.  1. Bilateral leg pain     MDM  4:30 PM Labs pending. Vitals stable and patient afebrile. Patient will have morphine for pain.   6:18 PM Labs unremarkable for acute changes. Patient Well's score is 0, making a DVT unlikely at this time. Patient will be discharged with tramadol and flexeril for pain and instructions to follow up with her PCP. Vitals stable and patient afebrile. Patient instructed to return with worsening or concerning symptoms.   Emilia Beck, PA-C 04/08/13 1829

## 2013-04-08 NOTE — ED Notes (Signed)
Pt complains of pain to bilateral legs x 8 weeks" after falling"

## 2013-05-21 IMAGING — CR DG CHEST 2V
2 series · 2 of 2 positions shown · non-contrast
Comparison: 10/08/2009.

CLINICAL DATA: Weakness.  History of smoking.  History of
emphysema.

CHEST - 2 VIEW

[w chest pa]
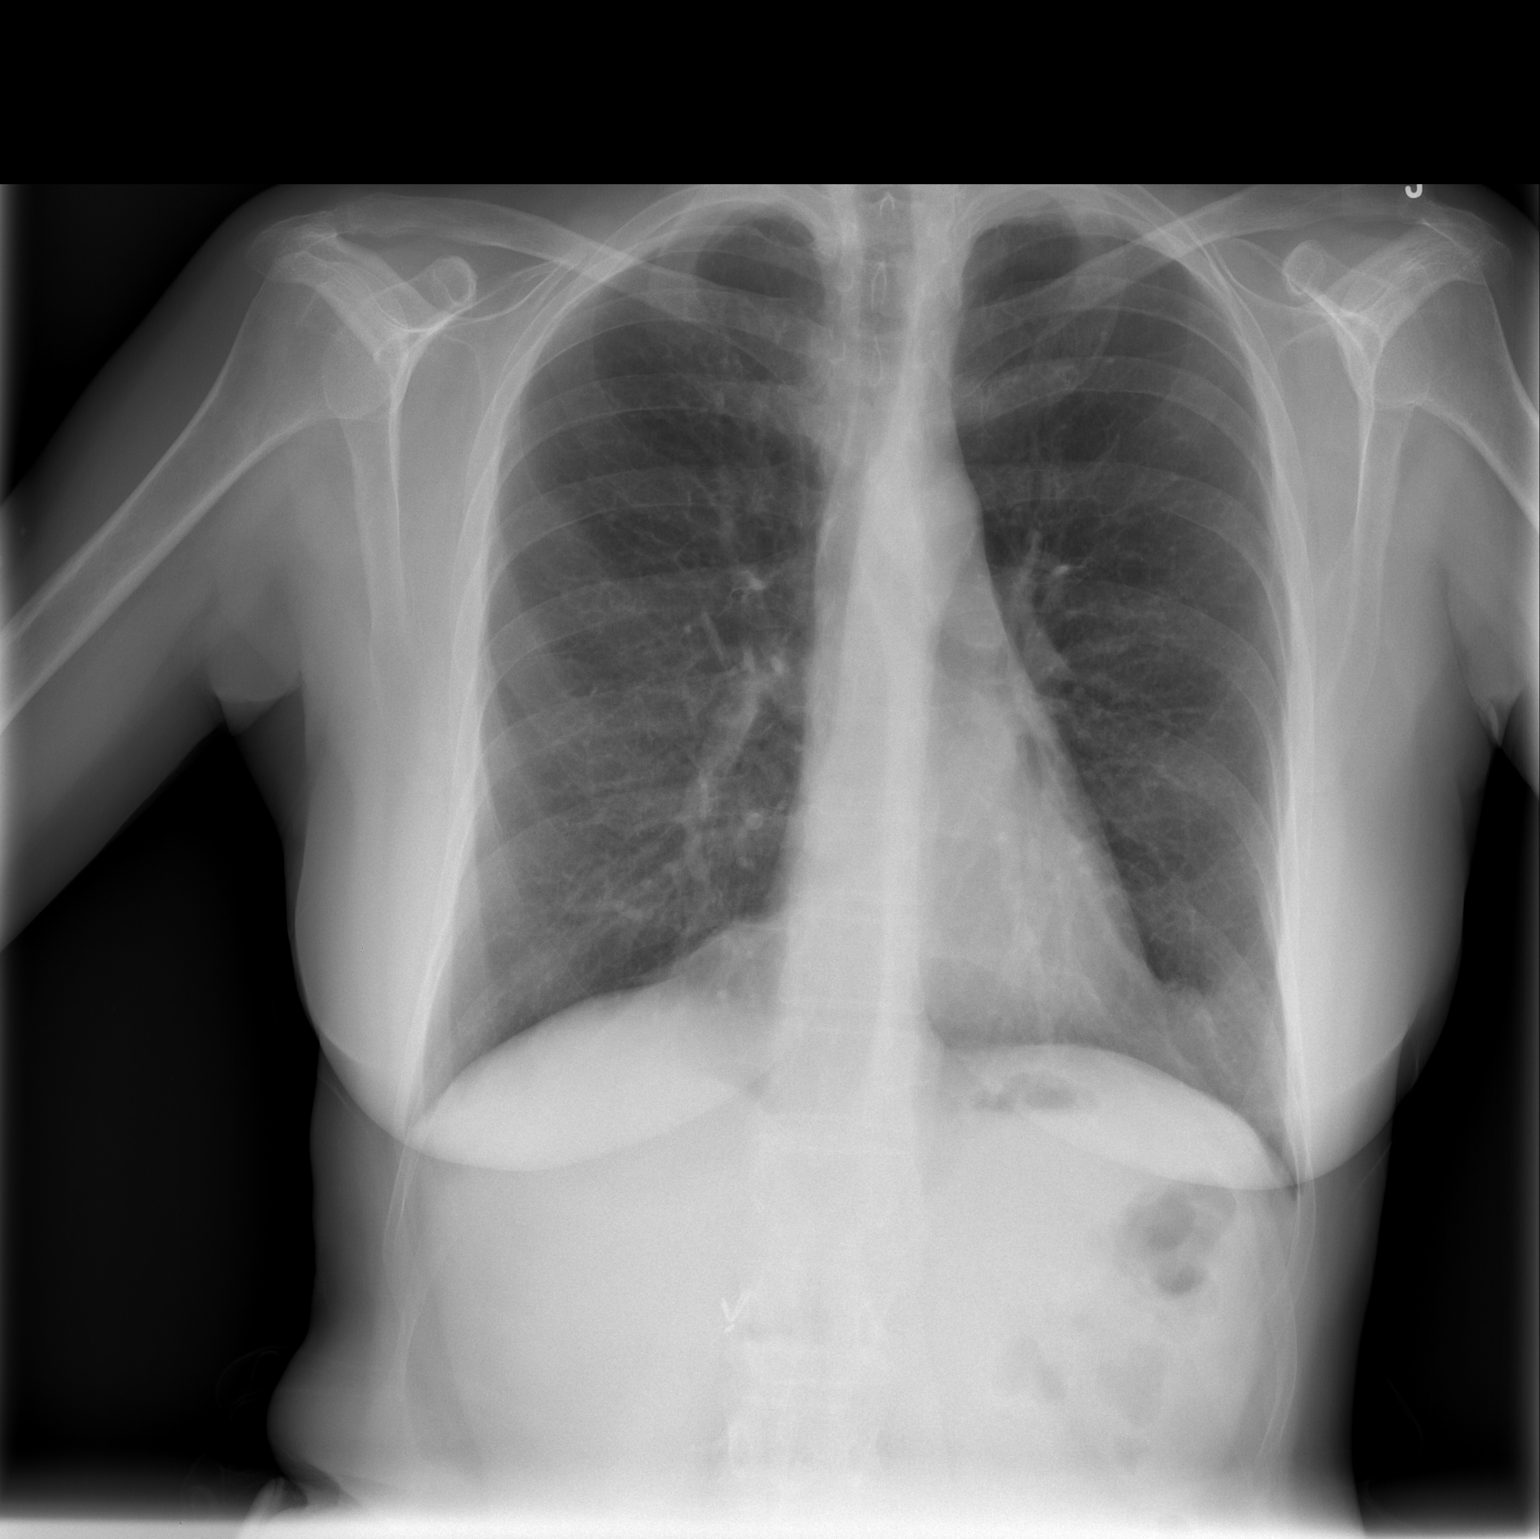

[w chest lat]
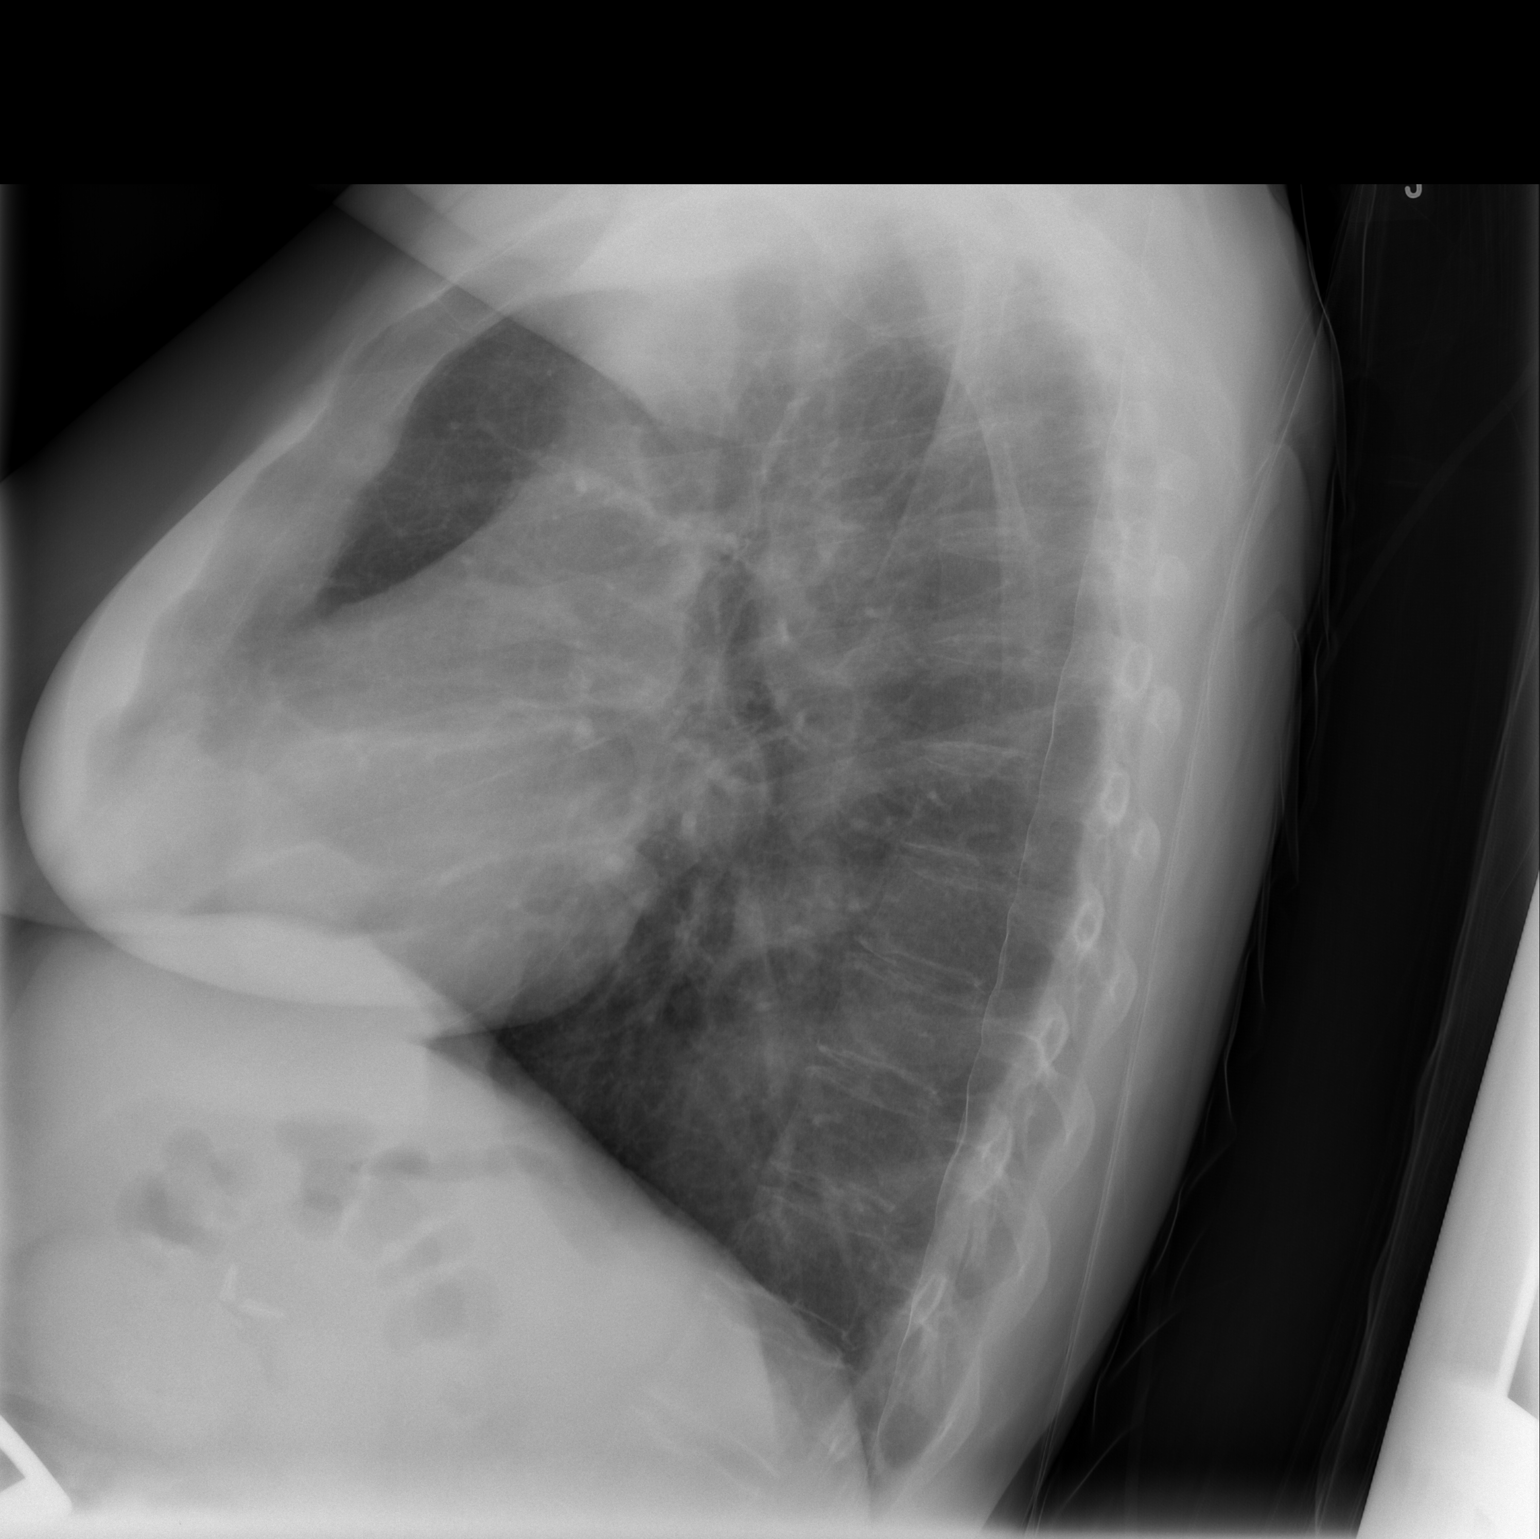

[2 of 2 positions shown; findings below may reference images not displayed]

FINDINGS: There is a generalized hyperinflation configuration with
a small cardiac silhouette.  Density in the left lateral base is
felt to represent epicardial fat and is unchanged from prior study.
No pulmonary edema, pneumonia, or pleural effusion is seen.
Mediastinal and hilar contours appear unchanged.  Apical pleural
thickening is stable.  There is osteopenic appearance of the bones.
Previous lower cervical anterior fusion has been performed with
hardware in place without evidence of disruption.
IMPRESSION: Generalized hyperinflation configuration consistent with
obstructive pulmonary disease with areas of emphysematous change.
No acute superimposed process.  Stable chronic findings detailed
above.

## 2013-06-14 ENCOUNTER — Encounter: Payer: Self-pay | Admitting: Diagnostic Neuroimaging

## 2013-06-14 ENCOUNTER — Ambulatory Visit (INDEPENDENT_AMBULATORY_CARE_PROVIDER_SITE_OTHER): Payer: PRIVATE HEALTH INSURANCE | Admitting: Diagnostic Neuroimaging

## 2013-06-14 VITALS — BP 117/72 | HR 80 | Temp 98.5°F | Ht 64.0 in | Wt 128.0 lb

## 2013-06-14 DIAGNOSIS — G8929 Other chronic pain: Secondary | ICD-10-CM | POA: Insufficient documentation

## 2013-06-14 DIAGNOSIS — M79604 Pain in right leg: Secondary | ICD-10-CM | POA: Insufficient documentation

## 2013-06-14 DIAGNOSIS — M545 Low back pain, unspecified: Secondary | ICD-10-CM

## 2013-06-14 DIAGNOSIS — M79609 Pain in unspecified limb: Secondary | ICD-10-CM

## 2013-06-14 NOTE — Patient Instructions (Signed)
I will check testing.

## 2013-06-14 NOTE — Progress Notes (Signed)
[facility]  PATIENT: Lori [name] DOB: Nov 28, 1955  REFERRING CLINICIAN: Ophelia Charter HISTORY FROM: patient, friend, husband REASON FOR VISIT: new consult   HISTORICAL  CHIEF COMPLAINT:  Chief Complaint  Patient presents with  . Fall    HISTORY OF PRESENT ILLNESS:   56 year old right-handed female with hypercholesterolemia, heart disease, migraine, depression, anxiety, smoking, here for evaluation of lower extremity pain.  July 2014 patient was at home, with nausea and vomiting and passed out. She fell down to the ground and patient's husband found her at home in a semi-conscious state. Patient was taken to the emergency room for further evaluation. Apparently she was diagnosed with low blood pressure and anemia.  Ever since that time she's had low back pain, bilateral leg pain and weakness. She didn't orthopedic surgery without a specific diagnosis. She also has diffuse upper and lower extremity weakness and pain.   REVIEW OF SYSTEMS: Full 14 system review of systems performed and notable only for fatigue chest pain swelling in legs spinning sensation shortness of breath cough wheezing snoring feeling of feeling cold increased thirst joint pain joint swelling cramps aching muscles allergies skin sensitivity frequent infections disinterest in activities decreased energy change in appetite anxiety depression dizziness restless leg swelling sleepiness headache.  ALLERGIES: Allergies  Allergen Reactions  . Codeine Nausea Only    CAUSES ULCERS  . Propoxyphene-Acetaminophen Nausea Only  . Toradol [Ketorolac Tromethamine] Nausea And Vomiting  . Hydrocodone-Acetaminophen Nausea Only    REACTION: "sick"  . Latex Rash    HOME MEDICATIONS: Prior to Admission medications   Medication Sig Start Date End Date Taking? Authorizing Provider  albuterol (PROAIR HFA) 108 (90 BASE) MCG/ACT inhaler Inhale 2 puffs into the lungs every 4 (four) hours as needed for wheezing or  shortness of breath.    Yes Historical Provider, MD  ALPRAZolam Prudy Feeler) 1 MG tablet Take 1 mg by mouth 3 (three) times daily as needed for anxiety.   Yes Historical Provider, MD  benzonatate (TESSALON) 100 MG capsule Take 100 mg by mouth every 6 (six) hours as needed for cough.   Yes Historical Provider, MD  butalbital-acetaminophen-caffeine (FIORICET, ESGIC) 50-325-40 MG per tablet Take 1-2 tablets by mouth every 8 (eight) hours as needed for headache.   Yes Historical Provider, MD  cyclobenzaprine (FLEXERIL) 10 MG tablet Take 1 tablet (10 mg total) by mouth 3 (three) times daily as needed for muscle spasms. 04/08/13  Yes Kaitlyn Szekalski, PA-C  DEXILANT 60 MG capsule Take 1 capsule by mouth as needed. 05/21/13  Yes Historical Provider, MD  docusate sodium (COLACE) 100 MG capsule Take 100-200 mg by mouth daily as needed for constipation.   Yes Historical Provider, MD  EPINEPHrine (EPIPEN) 0.3 mg/0.3 mL DEVI Inject 0.3 mg into the muscle once. For bee stings.   Yes Historical Provider, MD  hydrOXYzine (ATARAX/VISTARIL) 25 MG tablet Take 1 tablet by mouth as needed. 03/15/13  Yes Historical Provider, MD  meclizine (ANTIVERT) 25 MG tablet Take 25 mg by mouth 2 (two) times daily as needed for dizziness.   Yes Historical Provider, MD  mometasone-formoterol (DULERA) 100-5 MCG/ACT AERO Inhale 2 puffs into the lungs daily.   Yes Historical Provider, MD  montelukast (SINGULAIR) 10 MG tablet Take 10 mg by mouth at bedtime.   Yes Historical Provider, MD  naproxen (NAPROSYN) 500 MG tablet Take 500 mg by mouth 2 (two) times daily as needed (for pain).   Yes Historical Provider, MD  nitrofurantoin (MACRODANTIN) 100 MG capsule Take 1  capsule by mouth as needed. 05/31/13  Yes Historical Provider, MD  nitroGLYCERIN (NITROSTAT) 0.4 MG SL tablet Place 0.4 mg under the tongue every 5 (five) minutes as needed for chest pain.    Yes Historical Provider, MD  promethazine (PHENERGAN) 25 MG tablet Take 1 tablet by mouth as  needed. 05/21/13  Yes Historical Provider, MD  sulfamethoxazole-trimethoprim (BACTRIM,SEPTRA) 200-40 MG/5ML suspension Take 20 mLs by mouth 2 (two) times daily.   Yes Historical Provider, MD  traMADol (ULTRAM) 50 MG tablet Take 1 tablet (50 mg total) by mouth every 6 (six) hours as needed for pain. 04/08/13  Yes Kaitlyn Szekalski, PA-C  triamcinolone cream (KENALOG) 0.1 % Apply 1 application topically as needed. 03/16/13  Yes Historical Provider, MD  valACYclovir (VALTREX) 500 MG tablet Take 1 tablet by mouth 2 (two) times daily. 05/16/13  Yes Historical Provider, MD   Outpatient Prescriptions Prior to Visit  Medication Sig Dispense Refill  . albuterol (PROAIR HFA) 108 (90 BASE) MCG/ACT inhaler Inhale 2 puffs into the lungs every 4 (four) hours as needed for wheezing or shortness of breath.       . ALPRAZolam (XANAX) 1 MG tablet Take 1 mg by mouth 3 (three) times daily as needed for anxiety.      . benzonatate (TESSALON) 100 MG capsule Take 100 mg by mouth every 6 (six) hours as needed for cough.      . butalbital-acetaminophen-caffeine (FIORICET, ESGIC) 50-325-40 MG per tablet Take 1-2 tablets by mouth every 8 (eight) hours as needed for headache.      . cyclobenzaprine (FLEXERIL) 10 MG tablet Take 1 tablet (10 mg total) by mouth 3 (three) times daily as needed for muscle spasms.  30 tablet  0  . docusate sodium (COLACE) 100 MG capsule Take 100-200 mg by mouth daily as needed for constipation.      Marland Kitchen EPINEPHrine (EPIPEN) 0.3 mg/0.3 mL DEVI Inject 0.3 mg into the muscle once. For bee stings.      . meclizine (ANTIVERT) 25 MG tablet Take 25 mg by mouth 2 (two) times daily as needed for dizziness.      . montelukast (SINGULAIR) 10 MG tablet Take 10 mg by mouth at bedtime.      . naproxen (NAPROSYN) 500 MG tablet Take 500 mg by mouth 2 (two) times daily as needed (for pain).      . nitroGLYCERIN (NITROSTAT) 0.4 MG SL tablet Place 0.4 mg under the tongue every 5 (five) minutes as needed for chest pain.         Marland Kitchen sulfamethoxazole-trimethoprim (BACTRIM,SEPTRA) 200-40 MG/5ML suspension Take 20 mLs by mouth 2 (two) times daily.      . traMADol (ULTRAM) 50 MG tablet Take 1 tablet (50 mg total) by mouth every 6 (six) hours as needed for pain.  20 tablet  0   No facility-administered medications prior to visit.    PAST MEDICAL HISTORY: Past Medical History  Diagnosis Date  . COPD (chronic obstructive pulmonary disease)   . Acute MI     x3  . Asthma   . Chronic bronchitis   . Barrett esophagus 2007  . Hiatal hernia [phone]  . Duodenitis without mention of hemorrhage 2007  . Esophageal stricture   . Esophageal reflux 2007  . Anxiety   . Depression   . Multiple fractures     from falls, fx rt. elbow, fx left wrist, bilateral ankles  . Allergy   . Ulcer     PAST SURGICAL HISTORY: Past Surgical  History  Procedure Laterality Date  . Abdominal hysterectomy    . Neck surgery    . Cholecystectomy    . Elbow fracture surgery      right  . Wrist fracture surgery      left, has plate  . Colonoscopy      FAMILY HISTORY: Family History  Problem Relation Age of Onset  . Emphysema Father   . Colon cancer Neg Hx   . Diabetes Maternal Grandmother   . Diabetes Maternal Aunt   . Diabetes      mat. cousin  . Heart disease Brother   . Dementia Mother     SOCIAL HISTORY:  History   Social History  . Marital Status: Married    Spouse Name: Casimiro Needle    Number of Children: 2  . Years of Education: HS   Occupational History  . disable    Social History Main Topics  . Smoking status: Current Every Day Smoker -- 0.50 packs/day for 21 years    Types: Cigarettes  . Smokeless tobacco: Never Used     Comment: tobaccco info given 03/06/12  . Alcohol Use: No  . Drug Use: No  . Sexual Activity: Not on file   Other Topics Concern  . Not on file   Social History Narrative   Patient lives at home spouse.   Caffeine Use: tea 3-4 cups daily     PHYSICAL EXAM  Filed Vitals:    06/14/13 0914 06/14/13 0931 06/14/13 0932  BP:  125/71 117/72  Pulse:  69 80  Temp: 98.5 F (36.9 C)    TempSrc: Oral    Height: 5\' 4"  (1.626 m)    Weight: 128 lb (58.06 kg)      Not recorded    Body mass index is 21.96 kg/(m^2).  GENERAL EXAM: Patient is in no distress; STRONG SMELL OF CIG SMOKE. STRAIGHT LEG RAISE POSITIVE  CARDIOVASCULAR: Regular rate and rhythm, no murmurs, no carotid bruits  NEUROLOGIC: MENTAL STATUS: awake, alert, language fluent, comprehension intact, naming intact CRANIAL NERVE: pupils equal and reactive to light, visual fields full to confrontation, extraocular muscles intact, no nystagmus, facial sensation and strength symmetric, uvula midline, shoulder shrug symmetric, tongue midline. MOTOR: BUE 3 (LIMITED BY PAIN). BLE 2-3 (LIMITED BY PAIN). SENSORY: normal and symmetric to light touch; HYPERSENSITIVE IN FEET. COORDINATION: finger-nose-finger, fine finger movements normal REFLEXES: deep tendon reflexes present and symmetric GAIT/STATION: ANTALGIC, SLOW GAIT. UNSTEADY.   DIAGNOSTIC DATA (LABS, IMAGING, TESTING) - I reviewed patient records, labs, notes, testing and imaging myself where available.  Lab Results  Component Value Date   WBC 7.7 04/08/2013   HGB 12.4 04/08/2013   HCT 36.8 04/08/2013   MCV 85.6 04/08/2013   PLT 335 04/08/2013      Component Value Date/Time   NA 136 04/08/2013 1647   K 4.0 04/08/2013 1647   CL 102 04/08/2013 1647   CO2 24 04/08/2013 1647   GLUCOSE 96 04/08/2013 1647   BUN 8 04/08/2013 1647   CREATININE 0.90 04/08/2013 1647   CALCIUM 9.0 04/08/2013 1647   PROT 7.2 10/06/2012 1117   ALBUMIN 4.0 10/06/2012 1117   AST 12 10/06/2012 1117   ALT 10 10/06/2012 1117   ALKPHOS 89 10/06/2012 1117   BILITOT 0.4 10/06/2012 1117   GFRNONAA 70* 04/08/2013 1647   GFRAA 81* 04/08/2013 1647   No results found for this basename: CHOL, HDL, LDLCALC, LDLDIRECT, TRIG, CHOLHDL   No results found for this basename: HGBA1C  Lab Results    Component Value Date   VITAMINB12 355 10/06/2012   Lab Results  Component Value Date   TSH 0.43 10/06/2012    01/20/13 CT cervical spine: There is anterior cervical fusion from C3-C5. There is a  bony bridging of the cervical spine from C3-C7. No evidence of  acute fracture. Normal facet articulation. Normal craniocervical  junction. There is no prevertebral soft tissue swelling. No evidence of  epidural or paraspinal hematoma.   ASSESSMENT AND PLAN  57 y.o. year old female here with diffuse pain, mainly low back and legs. Weakness likely due to pain, but will rule out other causes with further testing.  Ddx: peripheral vascular disease, lumbar radiculopathy, neuropathy, myopathy, fibromyalgia  PLAN: 1. MRI lumbar spine 2. Labs 3. BLE arterial u/s 4. Pain control per PCP or pain clinic   Orders Placed This Encounter  Procedures  . [place] Lower Ext Art Bilat  . MR Lumbar Spine Wo Contrast  . CK  . Aldolase  . Vitamin B12  . TSH  . Hemoglobin A1c    Return in about 3 months (around 09/13/2013) for with Heide Guile or Penumalli.    Suanne Marker, MD 06/14/2013, 10:09 AM Certified in Neurology, Neurophysiology and Neuroimaging  [facility] [address] [place], [place] [address] [phone]

## 2013-06-15 LAB — VITAMIN B12: Vitamin B-12: 741 pg/mL (ref 211–946)

## 2013-06-15 LAB — HEMOGLOBIN A1C: Est. average glucose Bld gHb Est-mCnc: 108 mg/dL

## 2013-06-15 LAB — ALDOLASE: Aldolase: 5.8 U/L (ref 1.2–7.6)

## 2013-06-16 ENCOUNTER — Telehealth: Payer: Self-pay | Admitting: Diagnostic Neuroimaging

## 2013-06-16 NOTE — Telephone Encounter (Signed)
Pt's call mistakenly transferred to the sleep lab.  She is requesting the results from lab work done on 06/14/2013.  Please return her call to [phone].

## 2013-06-17 ENCOUNTER — Telehealth: Payer: Self-pay | Admitting: Diagnostic Neuroimaging

## 2013-06-18 NOTE — Telephone Encounter (Signed)
I called pt and LMVM for  Her at number she left.  Lab results normal.   She is to call back as needed.

## 2013-06-25 ENCOUNTER — Encounter (HOSPITAL_COMMUNITY): Payer: Self-pay | Admitting: *Deleted

## 2013-06-25 ENCOUNTER — Ambulatory Visit (INDEPENDENT_AMBULATORY_CARE_PROVIDER_SITE_OTHER): Payer: PRIVATE HEALTH INSURANCE | Admitting: Cardiology

## 2013-06-25 VITALS — BP 142/88 | HR 73 | Ht 64.0 in | Wt 125.8 lb

## 2013-06-25 DIAGNOSIS — R29898 Other symptoms and signs involving the musculoskeletal system: Secondary | ICD-10-CM

## 2013-06-25 DIAGNOSIS — M79609 Pain in unspecified limb: Secondary | ICD-10-CM

## 2013-06-25 DIAGNOSIS — R030 Elevated blood-pressure reading, without diagnosis of hypertension: Secondary | ICD-10-CM

## 2013-06-25 DIAGNOSIS — M79604 Pain in right leg: Secondary | ICD-10-CM

## 2013-06-25 DIAGNOSIS — F17200 Nicotine dependence, unspecified, uncomplicated: Secondary | ICD-10-CM

## 2013-06-25 DIAGNOSIS — R079 Chest pain, unspecified: Secondary | ICD-10-CM

## 2013-06-25 DIAGNOSIS — E785 Hyperlipidemia, unspecified: Secondary | ICD-10-CM

## 2013-06-25 MED ORDER — PREDNISONE 20 MG PO TABS: 20.0000 mg | ORAL_TABLET | Freq: Every day | ORAL | Status: DC

## 2013-06-25 NOTE — Patient Instructions (Signed)
Your physician has requested that you have a lexiscan myoview. For further information please visit [email or web address] Please follow instruction sheet, as given.  Your physician has requested that you have a lower or upper extremity arterial duplex. This test is an ultrasound of the arteries in the legs or arms. It looks at arterial blood flow in the legs and arms. Allow one hour for Lower and Upper Arterial scans. There are no restrictions or special instructions  Take prednisone 20 mg 3 tablets everyday for 3 days,then 20 mg 2 tablets everyday for 3 days,then 20 mg 1 tablet for 3 days then 20 mg (1/2 tablet ) for 3 days then stop  Your physician wants you to follow-up in 1 month Dr Herbie Baltimore.  You will receive a reminder letter in the mail two months in advance. If you don't receive a letter, please call our office to schedule the follow-up appointment.

## 2013-06-26 ENCOUNTER — Ambulatory Visit
Admission: RE | Admit: 2013-06-26 | Discharge: 2013-06-26 | Disposition: A | Discharge status: Home / Self Care | Payer: PRIVATE HEALTH INSURANCE | Source: Ambulatory Visit | Attending: Diagnostic Neuroimaging | Admitting: Diagnostic Neuroimaging

## 2013-06-26 DIAGNOSIS — M549 Dorsalgia, unspecified: Secondary | ICD-10-CM

## 2013-06-26 DIAGNOSIS — M79604 Pain in right leg: Secondary | ICD-10-CM

## 2013-06-26 DIAGNOSIS — M545 Low back pain: Secondary | ICD-10-CM

## 2013-06-27 ENCOUNTER — Encounter: Payer: Self-pay | Admitting: Cardiology

## 2013-06-27 DIAGNOSIS — F17210 Nicotine dependence, cigarettes, uncomplicated: Secondary | ICD-10-CM | POA: Insufficient documentation

## 2013-06-27 DIAGNOSIS — R079 Chest pain, unspecified: Secondary | ICD-10-CM | POA: Insufficient documentation

## 2013-06-27 DIAGNOSIS — R030 Elevated blood-pressure reading, without diagnosis of hypertension: Secondary | ICD-10-CM | POA: Insufficient documentation

## 2013-06-27 DIAGNOSIS — R29898 Other symptoms and signs involving the musculoskeletal system: Secondary | ICD-10-CM | POA: Insufficient documentation

## 2013-06-27 NOTE — Assessment & Plan Note (Signed)
The leg weakness is being evaluated by neurology, but I do worry with her smoking history that she could clearly have some claudication symptoms. She does have diminished flow show any pulses on palpation.  Plan: Lower Extremity Arterial Dopplers with ABIs.

## 2013-06-27 NOTE — Assessment & Plan Note (Addendum)
This chest discomfort is difficult to really assess because of her poor surgery. She certainly has cardiac risk factors of smoking and dyslipidemia with hypertension. She is not however on any antihypertensive it is now nor is she on lipid control agent. My clinical judgment as of this is probably costochondritis space and a reproducibly of symptoms, however with the risk factors it is prudent to evaluate for cardiac etiology. I don't think she can walk on a treadmill for me due to leg weakness, therefore will proceed with a LexiScan Myoview.   Plan: LexiScan Myoview

## 2013-06-27 NOTE — Assessment & Plan Note (Signed)
Monitor by PCP. Not on statin. For now we'll defer to primary physician. Depending on what the stress test and lower extremity arterial Dopplers show, we may need to become more cognizant of this risk factor.

## 2013-06-27 NOTE — Assessment & Plan Note (Signed)
Currently her blood pressures relatively controlled. It is somewhat elevated today, but she is quite nervous. We'll reassess on followup visits.

## 2013-06-27 NOTE — Assessment & Plan Note (Signed)
There is concern for possible claudication symptoms.  Plan: Lower Extremity Arterial Dopplers and ABIs.

## 2013-06-27 NOTE — Progress Notes (Signed)
PATIENT: Lori [name] MRN: [id number]  DOB: 11/10/1955   DOV:06/27/2013 PCP: No primary provider on file.  Clinic Note: Chief Complaint  Patient presents with  . New Evaluation    Chest pain off and on for several days w/no radiation.  +SOB - COPD, edema.  Positional lightheadedness.    HPI: Lori [name] is a 57 y.o. female with a PMH below who presents today for evaluation of chest pain. Lori [name] was seen here in 2010 by Dr. Joycie Peek for Lorsch rate weakness and chest discomfort. She had an echocardiogram done that was essentially normal as well the stress test that was low risk with no ischemia. Artery Dopplers did not show any evidence of PVD. She has not been back since then, but relates a hospital emergency room stay where she was told she had a mild MI in [facility]. However she did not have a stress test or other diagnostic tests performed. She is now referred back do to several episodes of chest discomfort.  Interval History: The patient is quite a difficult historian. Very tangential in her speech. She describes having several episodes of sharp sided discomfort in her chest about her left breast it isn't as along the medial aspect of the left breast. It will then sometimes ranged into the left arm and neck. It does exist it is associated with sweating. She doesn't necessarily describe it as being any worse with exertion or with rest.  Lori [name] does have baseline dyspnea, but is probably more a combination of her COPD which has as a/bronchitis features such as morning coughing with a productive cough as well as wheezing and air trapping. She denies any edema PND orthopnea. She says she does snore quite a bit, and will occasionally wake up at night short of breath but is not significant other feels out of bed she does occasionally get dizzy but does his vertigo. She reports an episode of near syncope back in June and July timeframe but denied any significant palpitations associated with it.  She also notes occasional hematuria but no melena/hematochezia.  In addition to the chest discomfort and dyspnea symptoms she also notes having to walk with a cane due to balance issues. She says her legs are subcutaneous. Weekly give out on her. She said multiple falls where she said that surgeries on both arms. This saline note discomfort in her legs, just simply weakness on walking.  The remainder of Cardiovascular ROS: positive for - chest pain, dyspnea on exertion, irregular heartbeat, orthopnea and shortness of breath negative for - edema, murmur, palpitations, paroxysmal nocturnal dyspnea or rapid heart rate: Additional cardiac review of systems: Lightheadedness - yes, dizziness - yes, syncope/near-syncope - one episode in July; TIA/amaurosis fugax - no Melena - no, hematochezia no; hematuria - yes; nosebleeds - no; claudication - equivocal, see above  Past Medical History  Diagnosis Date  . COPD (chronic obstructive pulmonary disease) with chronic bronchitis     & Emphysema  . Acute MI     x3  . Asthma   . Barrett esophagus 2007  . Hiatal hernia [id number]  . Duodenitis without mention of hemorrhage 2007  . Esophageal stricture   . Esophageal reflux 2007  . Anxiety   . Depression   . Multiple fractures     from falls, fx rt. elbow, fx left wrist, bilateral ankles  . Allergy   . Ulcer   . H/O hiatal hernia     Prior Cardiac Evaluation and Past Surgical History:  Past Surgical History  Procedure Laterality Date  . Abdominal hysterectomy    . Neck surgery    . Cholecystectomy    . Elbow fracture surgery      right  . Wrist fracture surgery      left, has plate  . Colonoscopy      Allergies  Allergen Reactions  . Codeine Nausea Only    CAUSES ULCERS  . Propoxyphene-Acetaminophen Nausea Only  . Toradol [Ketorolac Tromethamine] Nausea And Vomiting  . Hydrocodone-Acetaminophen Nausea Only    REACTION: "sick"  . Latex Rash   Current Outpatient Prescriptions    Medication Sig Dispense Refill  . albuterol (PROAIR HFA) 108 (90 BASE) MCG/ACT inhaler Inhale 2 puffs into the lungs every 4 (four) hours as needed for wheezing or shortness of breath.       . ALPRAZolam (XANAX) 1 MG tablet Take 1 mg by mouth 3 (three) times daily as needed for anxiety.      Marland Kitchen aspirin 81 MG tablet Take 81 mg by mouth daily.      . benzonatate (TESSALON) 100 MG capsule Take 100 mg by mouth every 6 (six) hours as needed for cough.      . butalbital-acetaminophen-caffeine (FIORICET, ESGIC) 50-325-40 MG per tablet Take 1-2 tablets by mouth every 8 (eight) hours as needed for headache.      . Choline Fenofibrate (FENOFIBRIC ACID) 135 MG CPDR Take 135 mg by mouth daily.      . cyclobenzaprine (FLEXERIL) 10 MG tablet Take 1 tablet (10 mg total) by mouth 3 (three) times daily as needed for muscle spasms.  30 tablet  0  . DEXILANT 60 MG capsule Take 1 capsule by mouth as needed.      . docusate sodium (COLACE) 100 MG capsule Take 100-200 mg by mouth daily as needed for constipation.      Marland Kitchen EPINEPHrine (EPIPEN) 0.3 mg/0.3 mL DEVI Inject 0.3 mg into the muscle once. For bee stings.      . mometasone (NASONEX) 50 MCG/ACT nasal spray Place 2 sprays into the nose 2 (two) times daily.      . mometasone-formoterol (DULERA) 100-5 MCG/ACT AERO Inhale 2 puffs into the lungs daily.      . montelukast (SINGULAIR) 10 MG tablet Take 10 mg by mouth at bedtime.      . nitroGLYCERIN (NITROSTAT) 0.4 MG SL tablet Place 0.4 mg under the tongue every 5 (five) minutes as needed for chest pain.       . predniSONE (DELTASONE) 10 MG tablet Take 10 mg by mouth daily.      . promethazine (PHENERGAN) 25 MG tablet Take 1 tablet by mouth as needed.      . theophylline (THEO-24) 100 MG 24 hr capsule Take 100 mg by mouth as needed.      . traMADol (ULTRAM) 50 MG tablet Take 1 tablet (50 mg total) by mouth every 6 (six) hours as needed for pain.  20 tablet  0  . Trospium Chloride 60 MG CP24 Take 60 mg by mouth daily.       . valACYclovir (VALTREX) 500 MG tablet Take 1 tablet by mouth 2 (two) times daily.      . Cholecalciferol (VITAMIN D-3) 1000 UNITS CAPS Take 1,000 Units by mouth daily.      Marland Kitchen estradiol (ESTRACE) 1 MG tablet Take 1 mg by mouth daily.      . folic acid (FOLVITE) 400 MCG tablet Take 400 mcg by mouth daily.      Marland Kitchen  hydrOXYzine (ATARAX/VISTARIL) 25 MG tablet Take 1 tablet by mouth as needed.      . loperamide (IMODIUM) 2 MG capsule Take 2 mg by mouth 4 (four) times daily as needed for diarrhea or loose stools.      . meclizine (ANTIVERT) 25 MG tablet Take 25 mg by mouth 2 (two) times daily as needed for dizziness.      . naproxen (NAPROSYN) 500 MG tablet Take 500 mg by mouth 2 (two) times daily as needed (for pain).      . nitrofurantoin (MACRODANTIN) 100 MG capsule Take 1 capsule by mouth as needed.      . pantoprazole (PROTONIX) 40 MG tablet Take 40 mg by mouth daily.      . predniSONE (DELTASONE) 20 MG tablet Take 1 tablet (20 mg total) by mouth daily.  21 tablet  0  . triamcinolone cream (KENALOG) 0.1 % Apply 1 application topically as needed.       No current facility-administered medications for this visit.  No Taking:  Folic acid, hydroxyzine, loperamide, meclizine, naproxen, nitrofurantoin, nitroglycerin, pantoprazole  History   Social History Narrative   Patient lives at home spouse Kathlene November.  Has 2 daughters (25 & 30).   Currently "disabled".   Caffeine Use: tea 3-4 cups daily. No Alcohol   Smokes ~1 1/2 PPD     Walks everyday - no set routine.   Family History: family history includes Dementia in her mother; Diabetes in her maternal aunt, maternal grandmother, and another family member; Emphysema in her father; Heart disease in her brother. There is no history of Colon cancer.   ROS: A comprehensive Review of Systems - Negative except Pertinent signs as noted above in history of present illness as well as the below Respiratory ROS: positive for - cough -mostly in the morning and  productive of dark brown sputum, orthopnea, pleuritic pain, shortness of breath and wheezing negative for - hemoptysis, sputum changes or tachypnea Musculoskeletal ROS: positive for - Leg weakness, multiple bruises and fractures in her arms., with questionable gait disturbance negative for - She does not necessarily note joint discomfort Neurological ROS: no TIA or stroke symptoms positive for - dizziness, gait disturbance and impaired coordination/balance  PHYSICAL EXAM BP 142/88  Pulse 73  Ht 5\' 4"  (1.626 m)  Wt 125 lb 12.8 oz (57.063 kg)  BMI 21.58 kg/m2 General appearance: alert, cooperative, appears stated age, no distress  HEENT: [place]/AT, EOMI, MMM, anicteric sclera; she wears glasses, is edentulous. Neck: no adenopathy, no carotid bruit and no JVD Lungs: Diffuse mild rhonchorous breath sounds with expiratory wheezing, but non-labored, and clear to percussion. Somewhat clears with coughing. Heart: regular rate and rhythm, S1, S2 normal, no murmur, click, rub or gallop nondisplaced  -- However simply placing the stethoscope along the costochondral margin on the left side as well along the inferior [place] of the breast for the mid clavicular line caused significant discomfort to the patient. Therefore palpation along this bilateral costochondral margin with the sternum and elicits significant discomfort and I followed this along the lower ribs a left-sided H. region demonstrated significant tenderness to palpation with eliciting of her sharp discomfort. Abdomen: soft, non-tender; bowel sounds normal; no masses,  no organomegaly; no hepato- jugular reflux Extremities: extremities normal, atraumatic, no cyanosis, and edema none Pulses: Trace to 1 + DP/PT pulses;  Skin: mobility and turgor normal or telangiectasias - arm(s) bilateral, lower leg(s) bilateral and varicosities - lower leg(s) bilateral, ankle(s) bilateral Neurologic: Mental status: Alert, oriented,  thought content  appropriate Cranial nerves: normal (II-XII grossly intact)   [id number] today: Yes Rate: 73 , Rhythm: NSR, nonspecific ST-T abnormalities  Recent Labs: None provided  ASSESSMENT / PLAN: Chest pain -  at rest and with exertion This chest discomfort is difficult to really assess because of her poor surgery. She certainly has cardiac risk factors of smoking and dyslipidemia with hypertension. She is not however on any antihypertensive it is now nor is she on lipid control agent. My clinical judgment as of this is probably costochondritis space and a reproducibly of symptoms, however with the risk factors it is prudent to evaluate for cardiac etiology. I don't think she can walk on a treadmill for me due to leg weakness, therefore will proceed with a LexiScan Myoview.   Plan: LexiScan Myoview  HYPERLIPIDEMIA  Monitor by PCP. Not on statin. For now we'll defer to primary physician. Depending on what the stress test and lower extremity arterial Dopplers show, we may need to become more cognizant of this risk factor.  Borderline hypertension Currently her blood pressures relatively controlled. It is somewhat elevated today, but she is quite nervous. We'll reassess on followup visits.  Leg pain, bilateral The leg weakness is being evaluated by neurology, but I do worry with her smoking history that she could clearly have some claudication symptoms. She does have diminished flow show any pulses on palpation.  Plan: Lower Extremity Arterial Dopplers with ABIs.  Leg fatigue There is concern for possible claudication symptoms.  Plan: Lower Extremity Arterial Dopplers and ABIs.  Smoking Release of 5 minute discussion with her about the importance of smoking cessation, short Lori [name] has COPD does acknowledge, and now we are evaluating for coronary for peripheral vascular disease. I discussed with her different options such as Chantix, nicotine patches and Nicorette gum. Also recommended she look  into [place] [employer].     Orders Placed This Encounter  Procedures  . Myocardial Perfusion Imaging    Dx  Chest pain    Standing Status: Future     Number of Occurrences:      Standing Expiration Date: 06/25/2014    Order Specific Question:  Where should this test be performed    Answer:  [facility]    Order Specific Question:  Type of stress    Answer:  Lexiscan    Order Specific Question:  Patient weight in lbs    Answer:  125  . EKG 12-Lead  . Lower Extremity Arterial Duplex Bilateral    Dx leg fatigue;smoking    Standing Status: Future     Number of Occurrences:      Standing Expiration Date: 06/25/2014    Order Specific Question:  Laterality    Answer:  Bilateral    Order Specific Question:  Where should this test be performed:    Answer:  [facility]    Followup: 3-4 weeks  DAVID W. Herbie Baltimore, M.D., M.S. [facility] [address] [place], [place]  [address]  [phone] Pager # [phone]

## 2013-06-27 NOTE — Assessment & Plan Note (Addendum)
Release of 5 minute discussion with her about the importance of smoking cessation, short he has COPD does acknowledge, and now we are evaluating for coronary for peripheral vascular disease. I discussed with her different options such as Chantix, nicotine patches and Nicorette gum. Also recommended she look into [place] [employer].

## 2013-06-28 ENCOUNTER — Telehealth: Payer: Self-pay | Admitting: Cardiology

## 2013-06-28 ENCOUNTER — Encounter: Payer: Self-pay | Admitting: *Deleted

## 2013-06-28 IMAGING — CR DG CERVICAL SPINE COMPLETE 4+V
6 series · 6 of 6 positions shown · non-contrast
Comparison: None available

CLINICAL DATA: Fall

CERVICAL SPINE - COMPLETE 4+ VIEW

[w c-spine lat *]
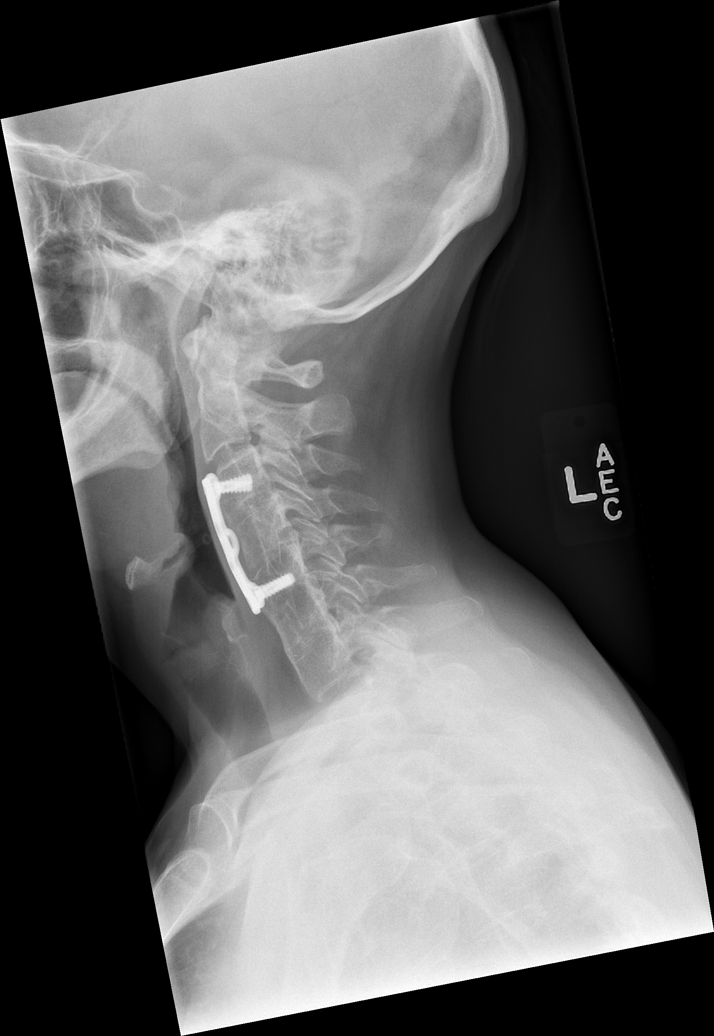

[w c-spine oblique * (1 of 2)]
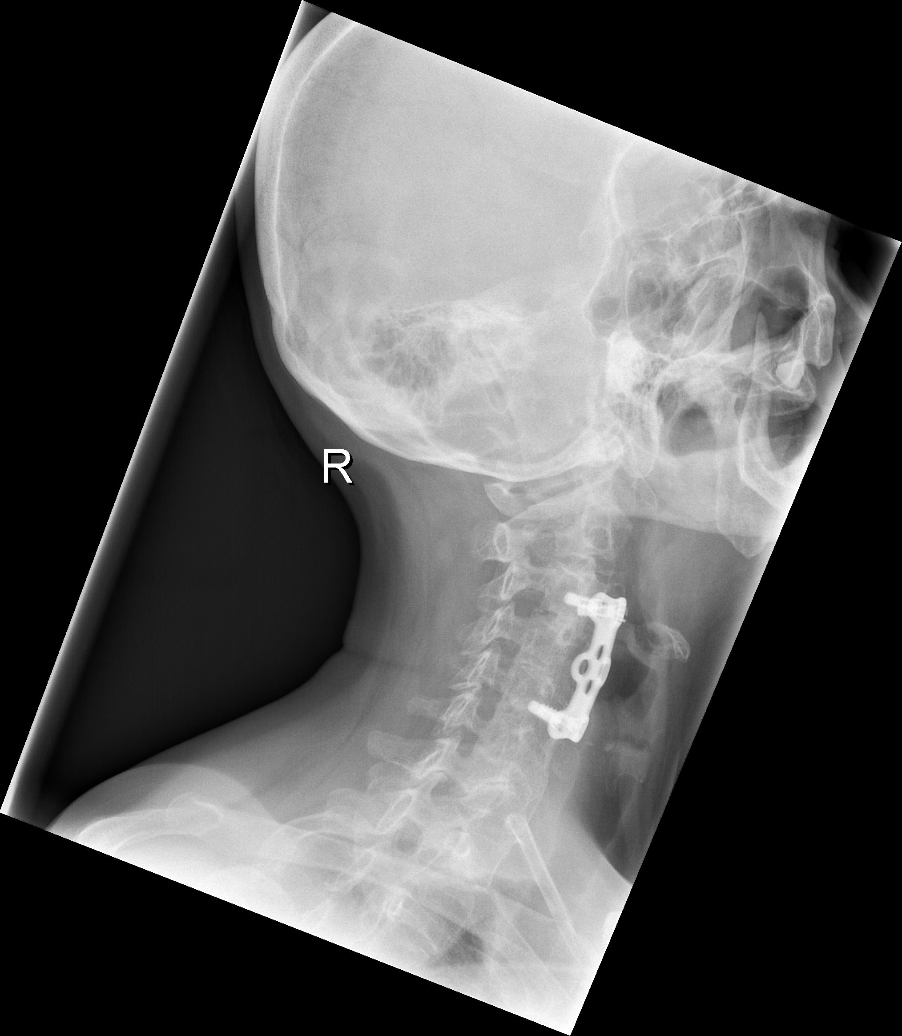

[w c-spine oblique * (2 of 2)]
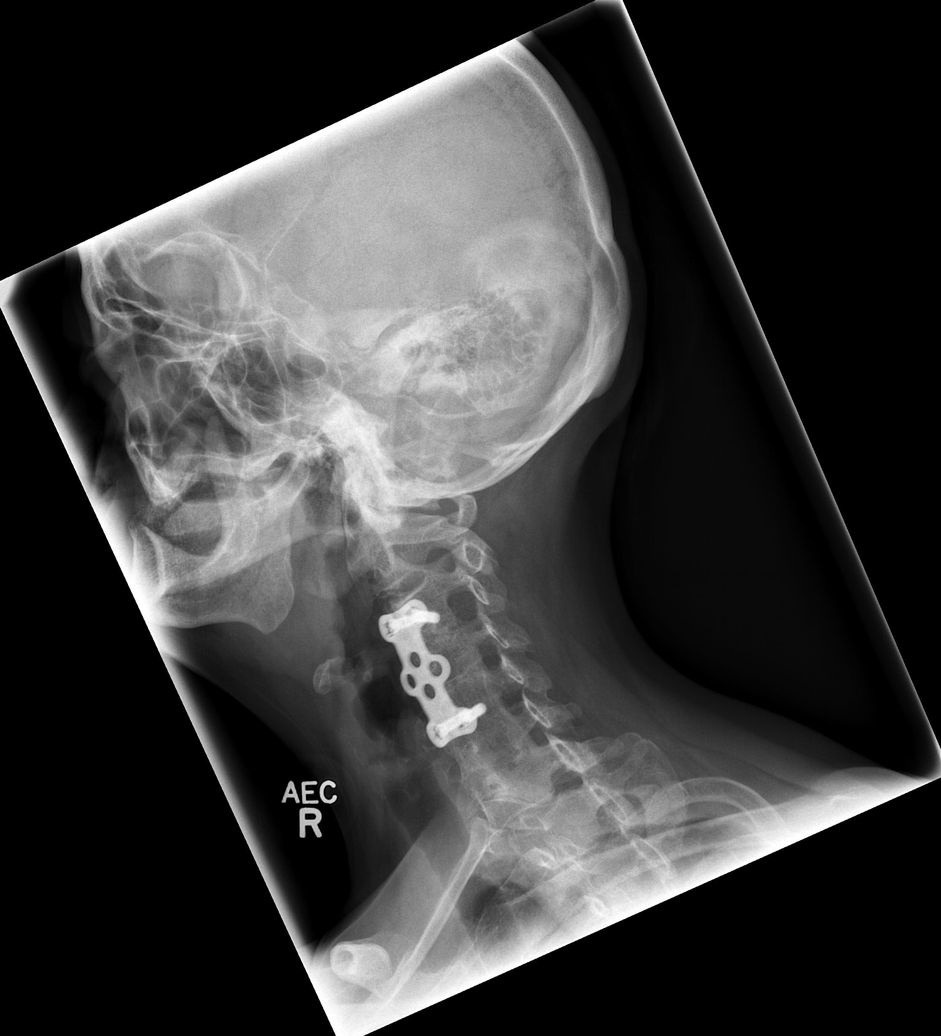

[w c-spine a.p. *]
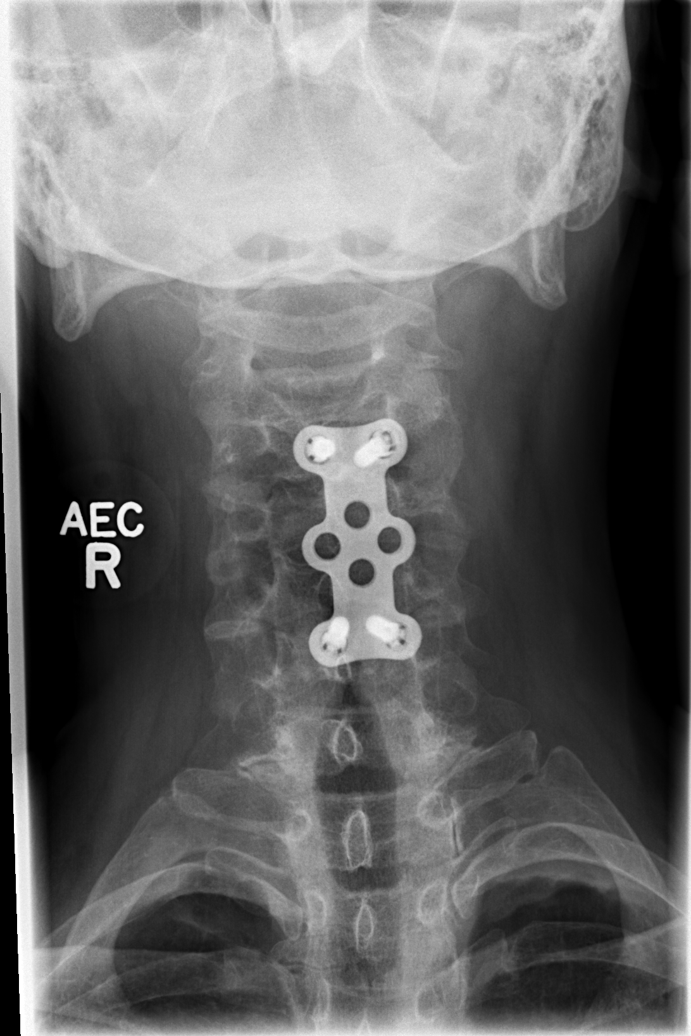

[w c-spine odontoid *]
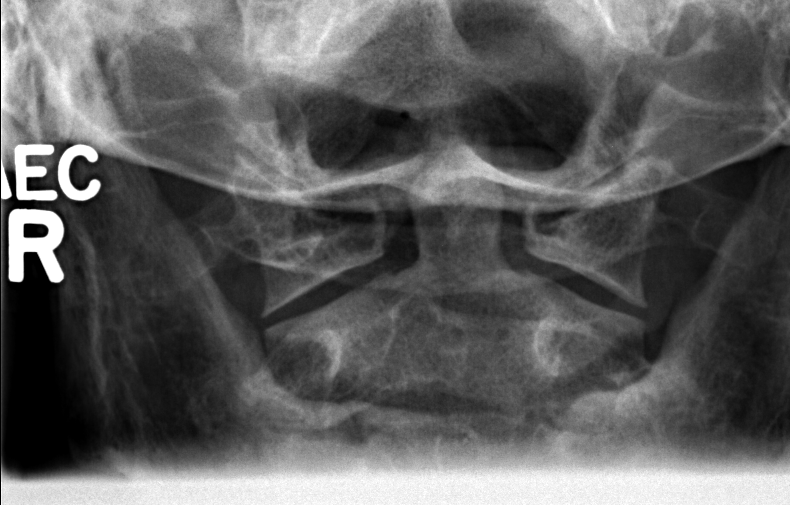

[w swimmers view *]
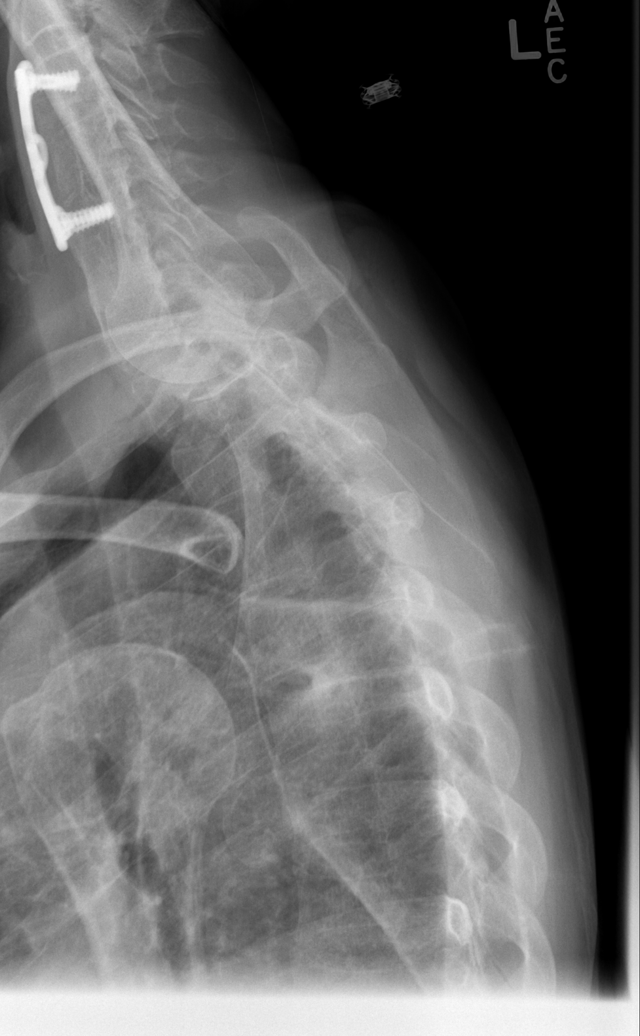

[6 of 6 positions shown; findings below may reference images not displayed]

FINDINGS: Anterior plate and screws are present with C3-C5.  No
breakage or loosening of the hardware.  Solid fusion through the
vertebral bodies is apparent from C3-C7.  No vertebral body height
loss.  No definite fracture.  Unremarkable prevertebral soft
tissues.  Patent foramina.  Intact odontoid.
IMPRESSION: Postoperative change.  No acute bony pathology.

## 2013-06-28 IMAGING — CR DG THORACIC SPINE 2V
3 series · 3 of 3 positions shown · non-contrast
Comparison: 03/28/2011

CLINICAL DATA: Fall

THORACIC SPINE - 2 VIEW

[t t-spine a.p.]
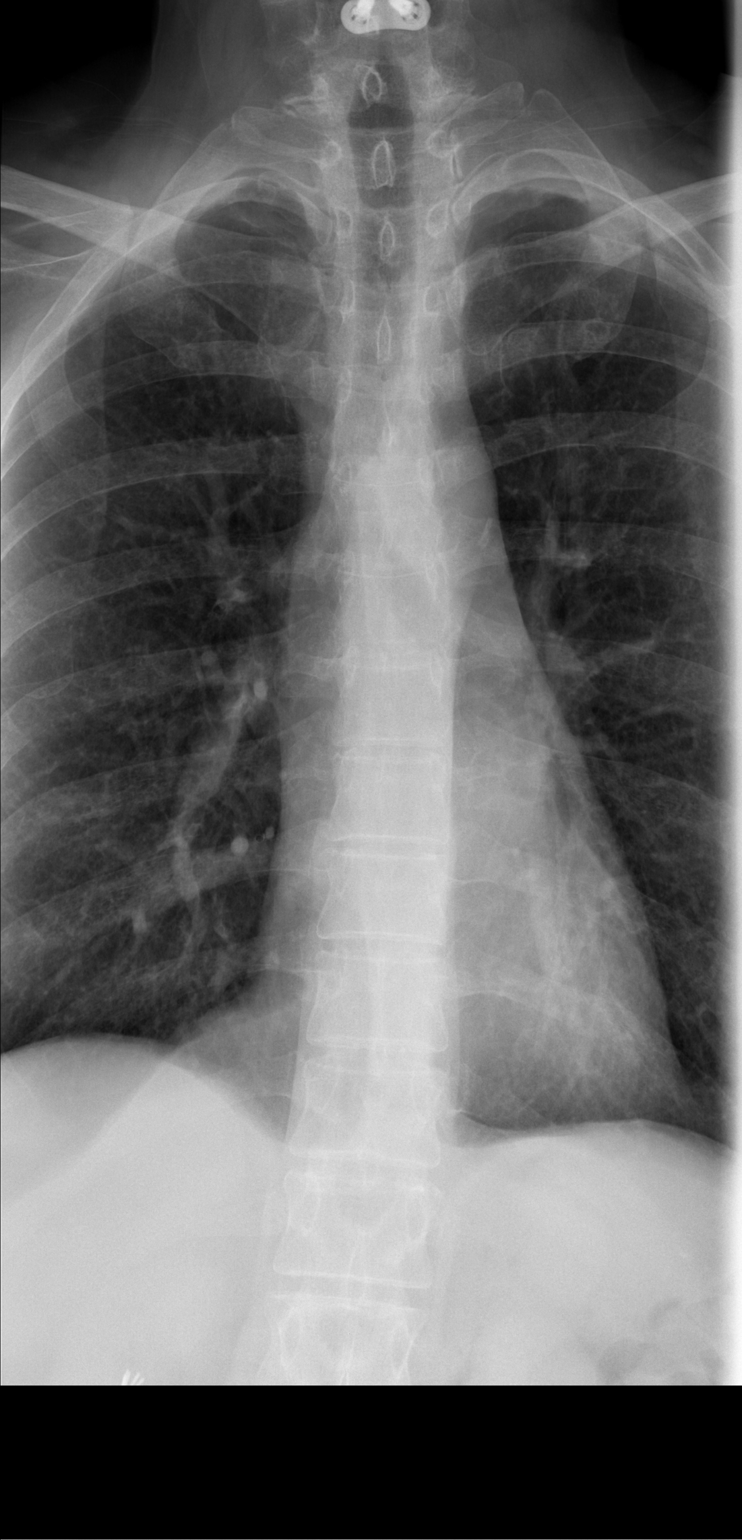

[t t-spine lat (1 of 2)]
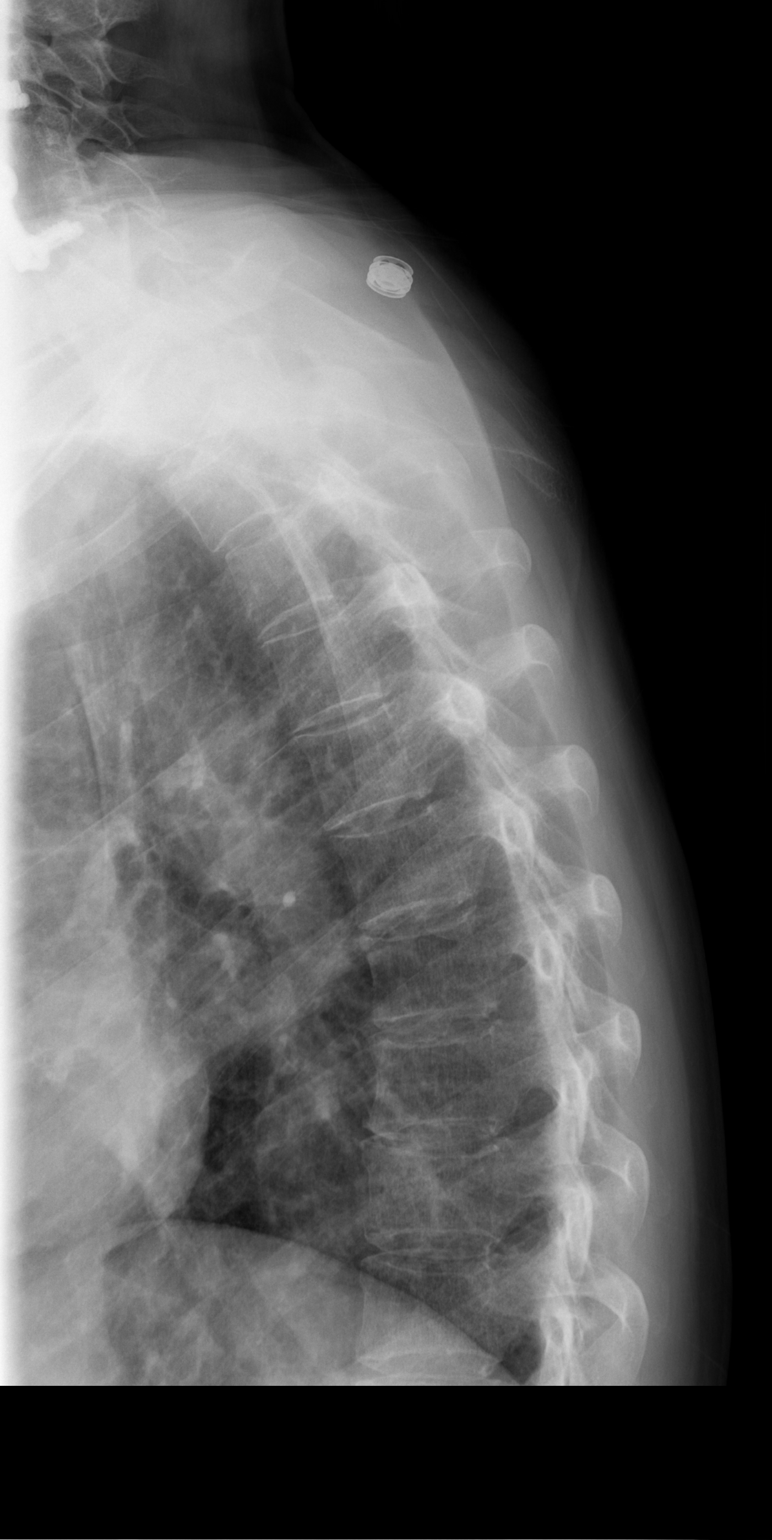

[t t-spine lat (2 of 2)]
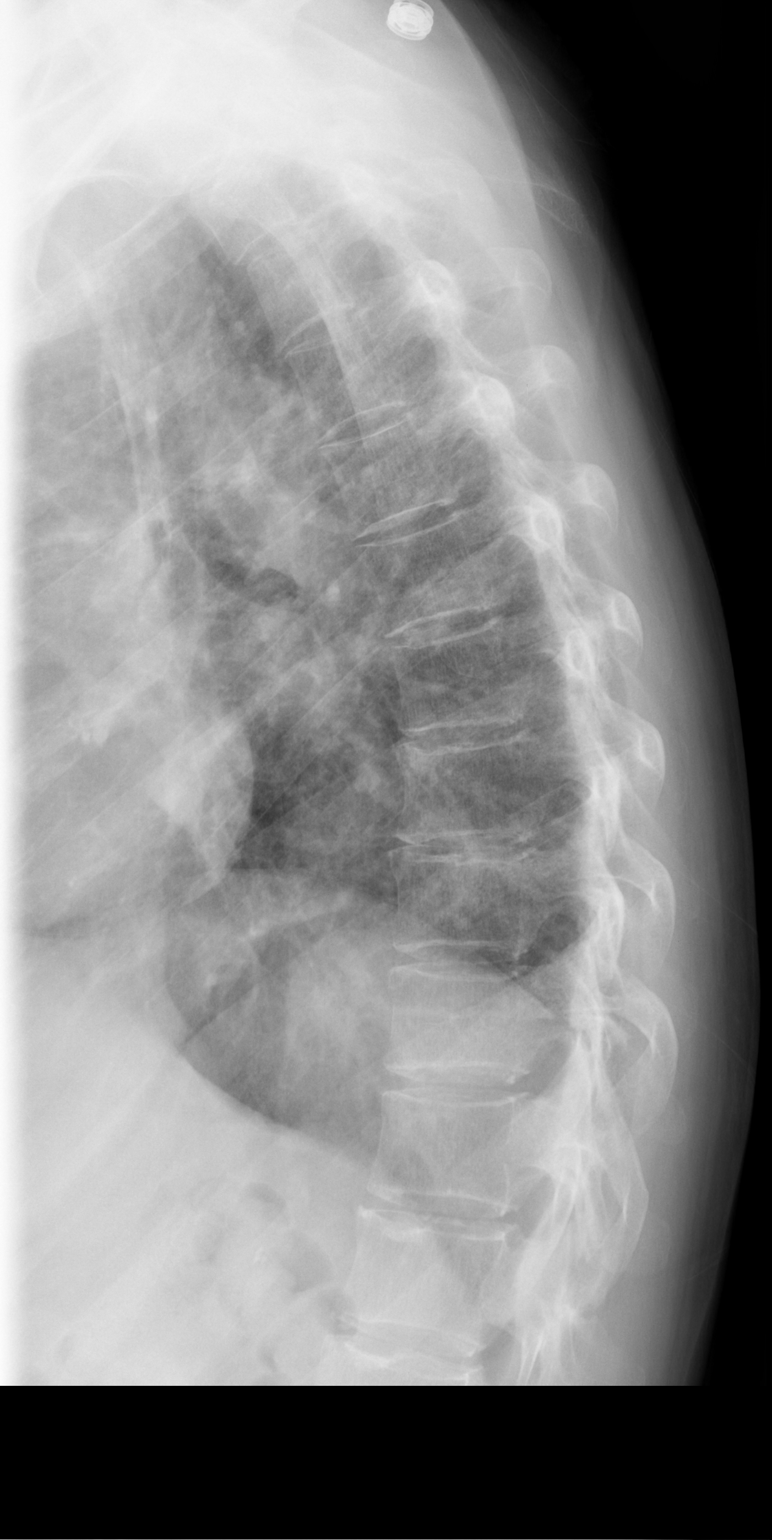

[3 of 3 positions shown; findings below may reference images not displayed]

FINDINGS: Anatomic alignment.  No vertebral body height loss.
Osteopenia.
IMPRESSION: No acute bony pathology.

## 2013-06-28 IMAGING — CR DG SHOULDER 2+V*L*
3 series · 3 of 3 positions shown · non-contrast
Comparison: 01/06/2010

CLINICAL DATA: Fall

LEFT SHOULDER - 2+ VIEW

[w shoulder ap internal left]
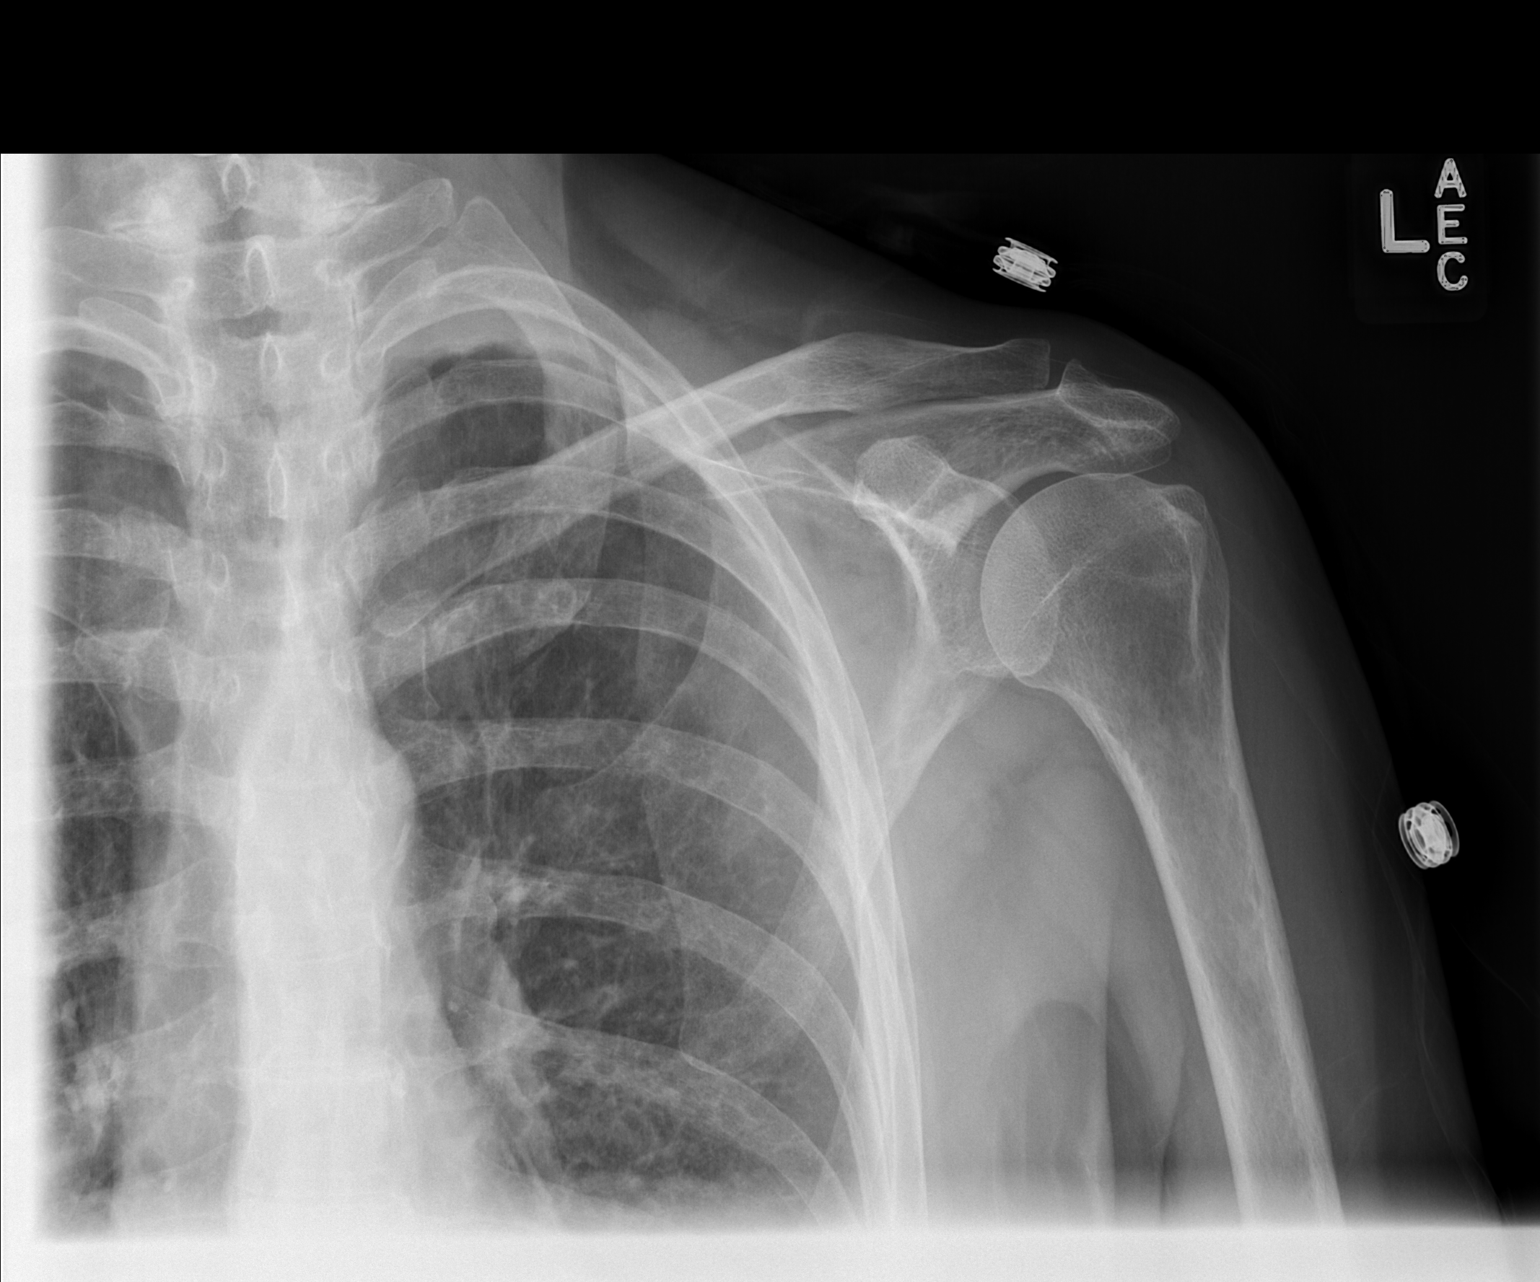

[w shoulder ap external left]
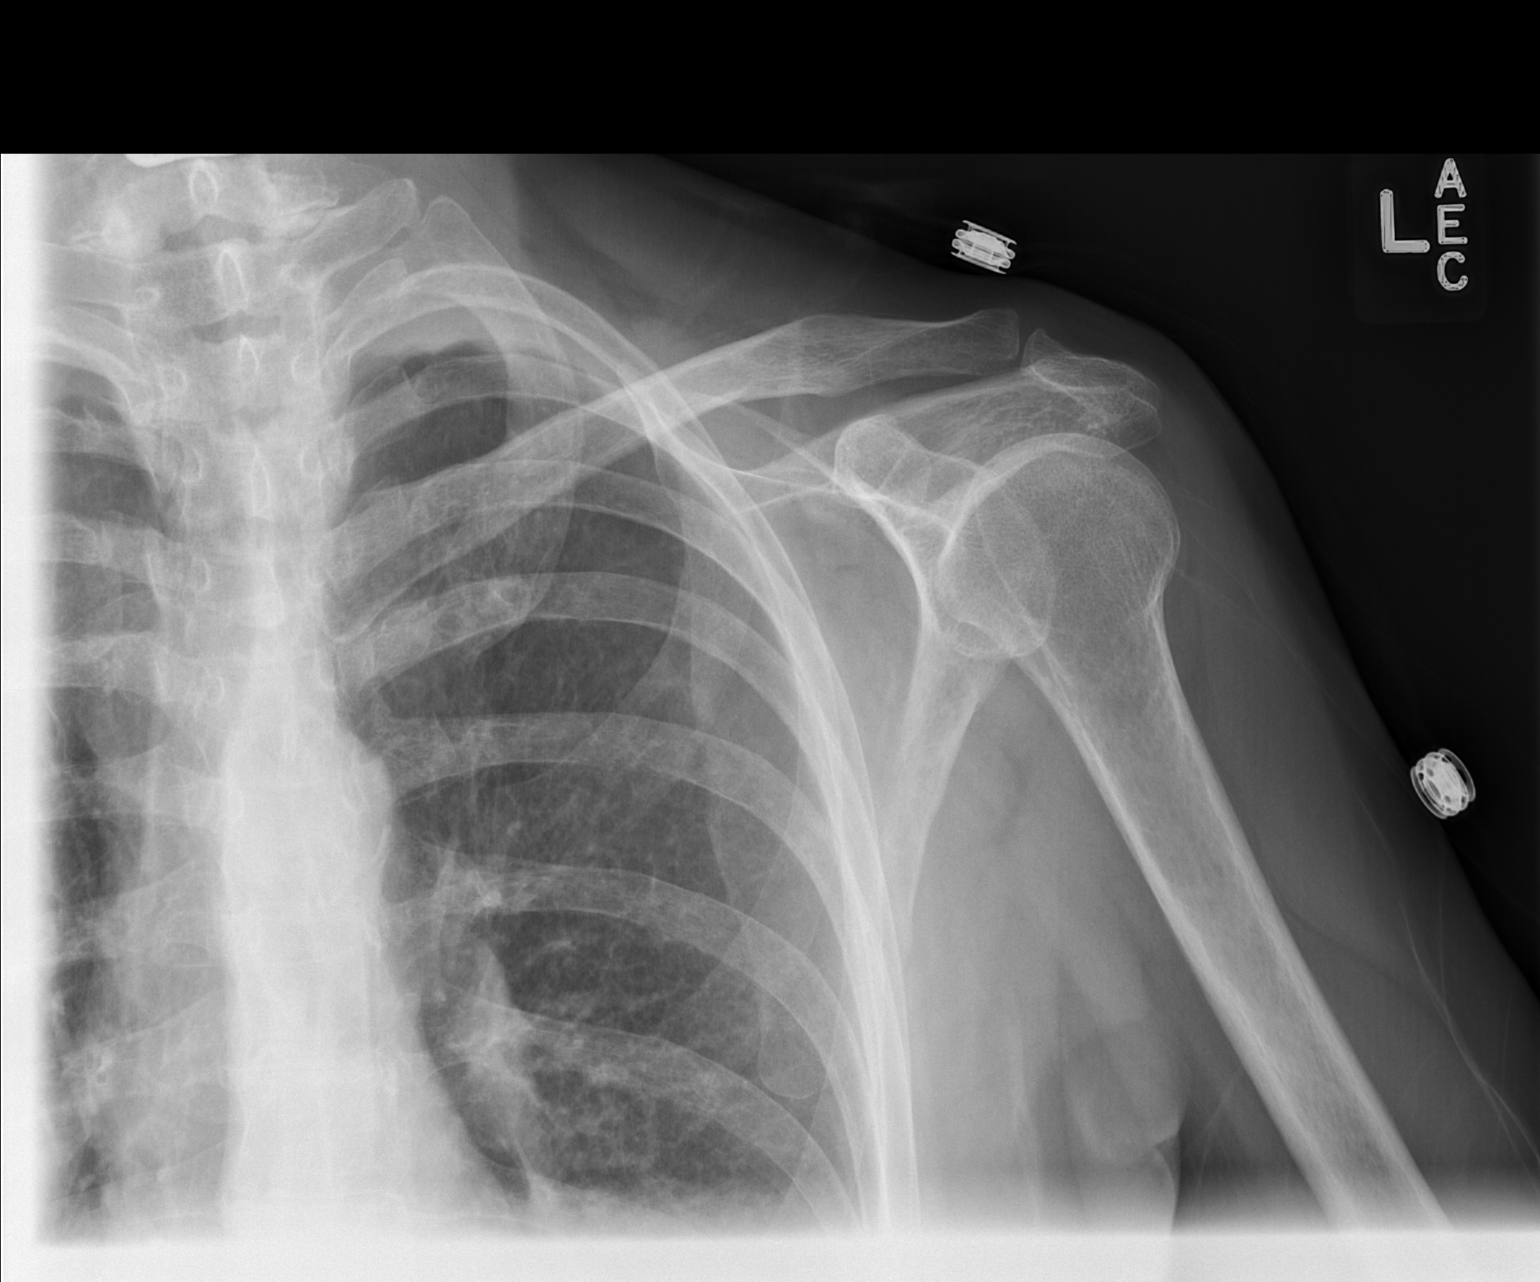

[w shoulder y view left *]
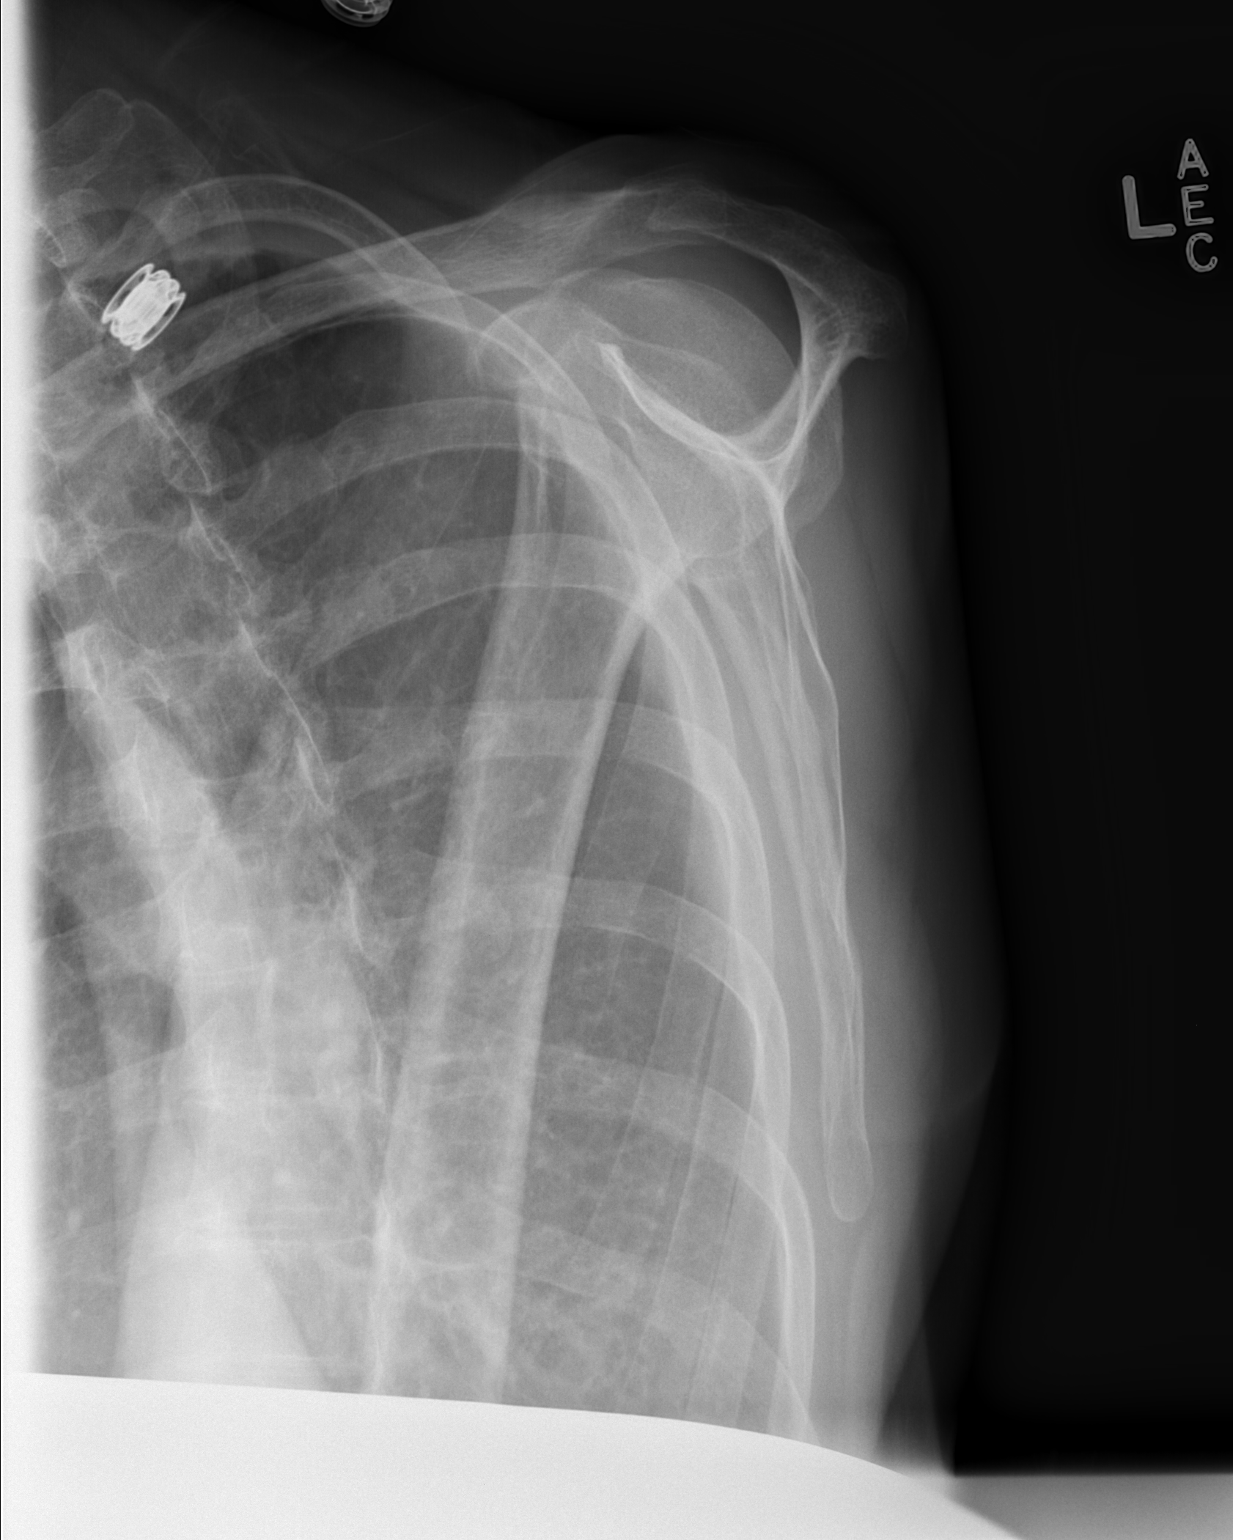

[3 of 3 positions shown; findings below may reference images not displayed]

FINDINGS: No acute fracture and no dislocation.  Unremarkable soft
tissues.
IMPRESSION: No acute bony pathology.

## 2013-06-28 NOTE — Addendum Note (Signed)
Addended byHermenia Fiscal on: 06/28/2013 10:49 AM   Modules accepted: Orders

## 2013-06-28 NOTE — Telephone Encounter (Signed)
Spoke with patient to inform that someone would contact her one results are reviewed. Patient verbalized understanding.

## 2013-06-28 NOTE — Telephone Encounter (Signed)
Calling about results from MRI of back she had on Saturday.

## 2013-06-29 ENCOUNTER — Encounter (HOSPITAL_COMMUNITY): Payer: PRIVATE HEALTH INSURANCE

## 2013-06-29 ENCOUNTER — Ambulatory Visit: Payer: PRIVATE HEALTH INSURANCE | Admitting: Cardiology

## 2013-07-01 ENCOUNTER — Telehealth: Payer: Self-pay | Admitting: Cardiology

## 2013-07-01 ENCOUNTER — Encounter: Payer: Self-pay | Admitting: Cardiology

## 2013-07-01 ENCOUNTER — Telehealth: Payer: Self-pay | Admitting: Diagnostic Neuroimaging

## 2013-07-01 ENCOUNTER — Other Ambulatory Visit: Payer: PRIVATE HEALTH INSURANCE

## 2013-07-01 NOTE — Telephone Encounter (Signed)
Returned call and pt verified x 2.  Pt informed message received and she will be notified once all of her tests are complete.  Pt reminded she has an appt tomorrow and on Tuesday.  Pt also reminded she has a f/u appt w/ Dr. Herbie Baltimore on 11.11.14 and he may not discuss results until appt.  Pt verbalized understanding and agreed w/ plan.  Pt also stated she had her cholesterol checked at her family doctor's office and "it was sky high."  Pt advised to bring a copy to office for Dr. Herbie Baltimore to review as this will help with him developing a plan for her care.  Pt verbalized understanding and agreed w/ plan.  Pt will bring in a copy today.  Message forwarded to Dr. Herbie Baltimore [facility]).

## 2013-07-01 NOTE — Telephone Encounter (Signed)
Patient said that the MRI was done on Sat. 06/25/13,please call her [phone].

## 2013-07-01 NOTE — Telephone Encounter (Signed)
Returned call and pt informed Dr. Herbie Baltimore did not order MRI.  Advised she contact provider that ordered test.  Pt verbalized understanding and agreed w/ plan.

## 2013-07-01 NOTE — Telephone Encounter (Signed)
So - I did not order the MRI.  It was abnormal, showing disc protrusion.  I am not sure what the surgeon will want to tell her about this.  My recommendation would be to contact that physician's office for a better explanation of the results & next plan of action. [name], MD

## 2013-07-01 NOTE — Telephone Encounter (Signed)
Would like to know her MRI results from last Saturday please.

## 2013-07-01 NOTE — Progress Notes (Signed)
This encounter was created in error - please disregard.

## 2013-07-02 ENCOUNTER — Ambulatory Visit (HOSPITAL_COMMUNITY)
Admission: RE | Admit: 2013-07-02 | Discharge: 2013-07-02 | Disposition: A | Discharge status: Home / Self Care | Payer: PRIVATE HEALTH INSURANCE | Source: Ambulatory Visit | Attending: Cardiology | Admitting: Cardiology

## 2013-07-02 DIAGNOSIS — R29898 Other symptoms and signs involving the musculoskeletal system: Secondary | ICD-10-CM

## 2013-07-02 DIAGNOSIS — IMO0001 Reserved for inherently not codable concepts without codable children: Secondary | ICD-10-CM

## 2013-07-02 DIAGNOSIS — F17200 Nicotine dependence, unspecified, uncomplicated: Secondary | ICD-10-CM | POA: Insufficient documentation

## 2013-07-02 DIAGNOSIS — R079 Chest pain, unspecified: Secondary | ICD-10-CM

## 2013-07-02 MED ORDER — TECHNETIUM TC 99M SESTAMIBI GENERIC - CARDIOLITE
31.0000 | Freq: Once | INTRAVENOUS | Status: AC | PRN
  Administered 2013-07-02: 31 via INTRAVENOUS

## 2013-07-02 MED ORDER — TECHNETIUM TC 99M SESTAMIBI GENERIC - CARDIOLITE
10.8000 | Freq: Once | INTRAVENOUS | Status: AC | PRN
  Administered 2013-07-02: 11 via INTRAVENOUS

## 2013-07-02 MED ORDER — REGADENOSON 0.4 MG/5ML IV SOLN
0.4000 mg | Freq: Once | INTRAVENOUS | Status: AC
  Administered 2013-07-02: 0.4 mg via INTRAVENOUS

## 2013-07-02 MED ORDER — AMINOPHYLLINE 25 MG/ML IV SOLN
125.0000 mg | Freq: Once | INTRAVENOUS | Status: AC
  Administered 2013-07-02: 125 mg via INTRAVENOUS

## 2013-07-02 NOTE — Procedures (Addendum)
[facility] [address] [address] [place] [address] [place] [address] [phone]  Cardiology Nuclear Med Study  Lori [name] is a 57 y.o. female     MRN : [id number]     DOB: 07-30-1956  Procedure Date: 07/02/2013  Nuclear Med Background Indication for Stress Test:  Evaluation for Ischemia History:  Asthma, COPD and MI--2012 Cardiac Risk Factors: Family History - CAD, Lipids and Smoker  Symptoms:  Chest Pain, Dizziness, DOE, Fatigue, Light-Headedness, Palpitations, SOB and BIL LOWER EXTREMITY WEAKNESS   Nuclear Pre-Procedure Caffeine/Decaff Intake:  9:00pm NPO After: 7:00am   IV Site: R Hand  IV 0.9% NS with Angio Cath:  22g  Chest Size (in):  N/A IV Started by: Emmit Pomfret, RN  Height: 5\' 4"  (1.626 m)  Cup Size: D  BMI:  Body mass index is 21.45 kg/(m^2). Weight:  125 lb (56.7 kg)   Tech Comments:  n/a    Nuclear Med Study 1 or 2 day study: 1 day  Stress Test Type:  Lexiscan  Order Authorizing Provider:  Bryan Lemma, MD   Resting Radionuclide: Technetium 98m Sestamibi  Resting Radionuclide Dose: 10.8 mCi   Stress Radionuclide:  Technetium 35m Sestamibi  Stress Radionuclide Dose: 31.0 mCi           Stress Protocol Rest HR: 75 Stress HR: 100  Rest BP: 132/87 Stress BP: 137/85  Exercise Time (min): n/a METS: n/a          Dose of Adenosine (mg):  n/a Dose of Lexiscan: 0.4 mg  Dose of Atropine (mg): n/a Dose of Dobutamine: n/a mcg/kg/min (at max HR)  Stress Test Technologist: Ernestene Mention, CCT Nuclear Technologist: Gonzella Lex, CNMT   Rest Procedure:  Myocardial perfusion imaging was performed at rest 45 minutes following the intravenous administration of Technetium 60m Sestamibi. Stress Procedure:  The patient received IV Lexiscan 0.4 mg over 15-seconds.  Technetium 76m Sestamibi injected at 30-seconds.  Due to patient's shortness of breath, she was given IV Aminophylline 75 mg. Symptom was resolved during recovery. There  were no significant changes with Lexiscan.  Quantitative spect images were obtained after a 45 minute delay.  Transient Ischemic Dilatation (Normal <1.22):  1.06 Lung/Heart Ratio (Normal <0.45):  0.25 QGS EDV:  39 ml QGS ESV:  6 ml LV Ejection Fraction: 83%  Signed by       Rest ECG: NSR - Normal EKG  Stress ECG: No significant change from baseline ECG  QPS Raw Data Images:  Mild motion artifact Stress Images:  Normal homogeneous uptake in all areas of the myocardium. Rest Images:  Normal homogeneous uptake in all areas of the myocardium. Subtraction (SDS):  Normal  Impression Exercise Capacity:  Lexiscan with no exercise. BP Response:  Normal blood pressure response. Clinical Symptoms:  Mild shortness of breath ECG Impression:  No significant ST segment change suggestive of ischemia. Comparison with Prior Nuclear Study: No significant change from previous study  Overall Impression:  Normal stress nuclear study.  LV Wall Motion:  NL LV Function, EF 83% post stress; NL Wall Motion   KELLY,THOMAS A, MD  07/02/2013 1:13 PM

## 2013-07-03 ENCOUNTER — Emergency Department (HOSPITAL_COMMUNITY)
Admission: EM | Admit: 2013-07-03 | Discharge: 2013-07-03 | Disposition: A | Discharge status: Home / Self Care | Payer: PRIVATE HEALTH INSURANCE | Attending: Emergency Medicine | Admitting: Emergency Medicine

## 2013-07-03 ENCOUNTER — Emergency Department (HOSPITAL_COMMUNITY): Payer: PRIVATE HEALTH INSURANCE

## 2013-07-03 ENCOUNTER — Encounter (HOSPITAL_COMMUNITY): Payer: Self-pay | Admitting: Emergency Medicine

## 2013-07-03 DIAGNOSIS — IMO0002 Reserved for concepts with insufficient information to code with codable children: Secondary | ICD-10-CM | POA: Insufficient documentation

## 2013-07-03 DIAGNOSIS — J4489 Other specified chronic obstructive pulmonary disease: Secondary | ICD-10-CM | POA: Insufficient documentation

## 2013-07-03 DIAGNOSIS — Z79899 Other long term (current) drug therapy: Secondary | ICD-10-CM | POA: Insufficient documentation

## 2013-07-03 DIAGNOSIS — Z7982 Long term (current) use of aspirin: Secondary | ICD-10-CM | POA: Insufficient documentation

## 2013-07-03 DIAGNOSIS — R5381 Other malaise: Secondary | ICD-10-CM | POA: Insufficient documentation

## 2013-07-03 DIAGNOSIS — Z91040 Latex allergy status: Secondary | ICD-10-CM | POA: Insufficient documentation

## 2013-07-03 DIAGNOSIS — K219 Gastro-esophageal reflux disease without esophagitis: Secondary | ICD-10-CM | POA: Insufficient documentation

## 2013-07-03 DIAGNOSIS — F411 Generalized anxiety disorder: Secondary | ICD-10-CM | POA: Insufficient documentation

## 2013-07-03 DIAGNOSIS — F3289 Other specified depressive episodes: Secondary | ICD-10-CM | POA: Insufficient documentation

## 2013-07-03 DIAGNOSIS — Z8781 Personal history of (healed) traumatic fracture: Secondary | ICD-10-CM | POA: Insufficient documentation

## 2013-07-03 DIAGNOSIS — R079 Chest pain, unspecified: Secondary | ICD-10-CM

## 2013-07-03 DIAGNOSIS — I252 Old myocardial infarction: Secondary | ICD-10-CM | POA: Insufficient documentation

## 2013-07-03 DIAGNOSIS — Z872 Personal history of diseases of the skin and subcutaneous tissue: Secondary | ICD-10-CM | POA: Insufficient documentation

## 2013-07-03 DIAGNOSIS — F329 Major depressive disorder, single episode, unspecified: Secondary | ICD-10-CM | POA: Insufficient documentation

## 2013-07-03 DIAGNOSIS — F17200 Nicotine dependence, unspecified, uncomplicated: Secondary | ICD-10-CM | POA: Insufficient documentation

## 2013-07-03 DIAGNOSIS — J449 Chronic obstructive pulmonary disease, unspecified: Secondary | ICD-10-CM | POA: Insufficient documentation

## 2013-07-03 LAB — CBC
MCH: 29.2 pg (ref 26.0–34.0)
MCHC: 34.5 g/dL (ref 30.0–36.0)
Platelets: 262 10*3/uL (ref 150–400)
RBC: 4.42 MIL/uL (ref 3.87–5.11)

## 2013-07-03 LAB — COMPREHENSIVE METABOLIC PANEL
ALT: 9 U/L (ref 0–35)
AST: 10 U/L (ref 0–37)
CO2: 22 mEq/L (ref 19–32)
Calcium: 9.5 mg/dL (ref 8.4–10.5)
GFR calc non Af Amer: >90 mL/min (ref >90)
Glucose, Bld: 106 mg/dL — ABNORMAL HIGH (ref 70–99)
Potassium: 4.1 mEq/L (ref 3.5–5.1)
Sodium: 136 mEq/L (ref 135–145)
Total Protein: 6.6 g/dL (ref 6.0–8.3)

## 2013-07-03 LAB — URINALYSIS, ROUTINE W REFLEX MICROSCOPIC
Glucose, UA: NEGATIVE mg/dL
Hgb urine dipstick: NEGATIVE
Specific Gravity, Urine: 1.014 (ref 1.005–1.030)
pH: 7 (ref 5.0–8.0)

## 2013-07-03 MED ORDER — TRAMADOL HCL 50 MG PO TABS: 50.0000 mg | ORAL_TABLET | Freq: Four times a day (QID) | ORAL | Status: DC | PRN

## 2013-07-03 MED ORDER — OXYCODONE-ACETAMINOPHEN 5-325 MG PO TABS
1.0000 | ORAL_TABLET | Freq: Once | ORAL | Status: AC
  Administered 2013-07-03: 1 via ORAL
  Filled 2013-07-03: qty 1

## 2013-07-03 MED ORDER — ONDANSETRON 8 MG PO TBDP
8.0000 mg | ORAL_TABLET | Freq: Once | ORAL | Status: AC
  Administered 2013-07-03: 8 mg via ORAL
  Filled 2013-07-03: qty 1

## 2013-07-03 MED ORDER — ALPRAZOLAM 0.5 MG PO TABS
0.5000 mg | ORAL_TABLET | Freq: Once | ORAL | Status: AC
  Administered 2013-07-03: 0.5 mg via ORAL
  Filled 2013-07-03: qty 1

## 2013-07-03 NOTE — ED Notes (Signed)
Lungs clear, NSR, S1 S2

## 2013-07-03 NOTE — ED Provider Notes (Signed)
CSN: [id number]     Arrival date & time 07/03/13  1012 History   First MD Initiated Contact with Patient 07/03/13 1019     Chief Complaint  Patient presents with  . Chest Pain   (Consider location/radiation/quality/duration/timing/severity/associated sxs/prior Treatment) Patient is a 57 y.o. female presenting with chest pain. The history is provided by the patient.  Chest Pain Associated symptoms: no abdominal pain, no back pain, no fever, no headache, no palpitations and no shortness of breath   pt c/o mid chest pain for the past couple days, also notes recent stress/anxiety, and generalized weakness. No acute or abrupt change in symptoms today. Has recently seen pcp and cardiologist for same. Had stress test yesterday. No hx stent/cad. Denies family hx premature cad. Hx copd, smoker. Non productive cough. No sore throat, nasal drainage or other uri c/o. No fever or chills. No orthopnea or pnd. Pt denies leg pain or swelling, no hx dvt or pe. No pleuritic cp. Pt states is scheduled to have arterial dopplers checked early this week as outpt.  No rest pain, or new/worsening claudication.  Pt states compliant w normal meds, denies change in meds.     Past Medical History  Diagnosis Date  . COPD (chronic obstructive pulmonary disease) with chronic bronchitis     & Emphysema  . Acute MI     x3  . Asthma   . Barrett esophagus 2007  . Hiatal hernia [id number]  . Duodenitis without mention of hemorrhage 2007  . Esophageal stricture   . Esophageal reflux 2007  . Anxiety   . Depression   . Multiple fractures     from falls, fx rt. elbow, fx left wrist, bilateral ankles  . Allergy   . Ulcer   . H/O hiatal hernia    Past Surgical History  Procedure Laterality Date  . Abdominal hysterectomy    . Neck surgery    . Cholecystectomy    . Elbow fracture surgery      right  . Wrist fracture surgery      left, has plate  . Colonoscopy     Family History  Problem Relation Age of Onset  .  Emphysema Father   . Colon cancer Neg Hx   . Diabetes Maternal Grandmother   . Diabetes Maternal Aunt   . Diabetes      mat. cousin  . Heart disease Brother   . Dementia Mother    History  Substance Use Topics  . Smoking status: Current Every Day Smoker -- 0.50 packs/day for 21 years    Types: Cigarettes  . Smokeless tobacco: Never Used     Comment: tobaccco info given 03/06/12  . Alcohol Use: No   OB History   Grav Para Term Preterm Abortions TAB SAB Ect Mult Living                 Review of Systems  Constitutional: Negative for fever and chills.  HENT: Negative for sore throat.   Eyes: Negative for redness.  Respiratory: Negative for shortness of breath.   Cardiovascular: Positive for chest pain. Negative for palpitations and leg swelling.  Gastrointestinal: Negative for abdominal pain.  Genitourinary: Negative for flank pain.  Musculoskeletal: Negative for back pain and neck pain.  Skin: Negative for rash.  Neurological: Negative for headaches.  Hematological: Does not bruise/bleed easily.  Psychiatric/Behavioral: The patient is nervous/anxious.     Allergies  Codeine; Propoxyphene-acetaminophen; Toradol; Hydrocodone-acetaminophen; and Latex  Home Medications   Current Outpatient Rx  Name  Route  Sig  Dispense  Refill  . albuterol (PROAIR HFA) 108 (90 BASE) MCG/ACT inhaler   Inhalation   Inhale 2 puffs into the lungs every 4 (four) hours as needed for wheezing or shortness of breath.          . ALPRAZolam (XANAX) 1 MG tablet   Oral   Take 1 mg by mouth 3 (three) times daily as needed for anxiety.         Marland Kitchen aspirin 81 MG tablet   Oral   Take 81 mg by mouth daily.         . benzonatate (TESSALON) 100 MG capsule   Oral   Take 100 mg by mouth every 6 (six) hours as needed for cough.         . butalbital-acetaminophen-caffeine (FIORICET, ESGIC) 50-325-40 MG per tablet   Oral   Take 1-2 tablets by mouth every 8 (eight) hours as needed for headache.          . Cholecalciferol (VITAMIN D-3) 1000 UNITS CAPS   Oral   Take 1,000 Units by mouth daily.         . Choline Fenofibrate (FENOFIBRIC ACID) 135 MG CPDR   Oral   Take 135 mg by mouth daily.         . cyclobenzaprine (FLEXERIL) 10 MG tablet   Oral   Take 1 tablet (10 mg total) by mouth 3 (three) times daily as needed for muscle spasms.   30 tablet   0   . DEXILANT 60 MG capsule   Oral   Take 1 capsule by mouth as needed.         . docusate sodium (COLACE) 100 MG capsule   Oral   Take 100-200 mg by mouth daily as needed for constipation.         Marland Kitchen EPINEPHrine (EPIPEN) 0.3 mg/0.3 mL DEVI   Intramuscular   Inject 0.3 mg into the muscle once. For bee stings.         Marland Kitchen estradiol (ESTRACE) 1 MG tablet   Oral   Take 1 mg by mouth daily.         . folic acid (FOLVITE) 400 MCG tablet   Oral   Take 400 mcg by mouth daily.         . hydrOXYzine (ATARAX/VISTARIL) 25 MG tablet   Oral   Take 1 tablet by mouth as needed.         . loperamide (IMODIUM) 2 MG capsule   Oral   Take 2 mg by mouth 4 (four) times daily as needed for diarrhea or loose stools.         . meclizine (ANTIVERT) 25 MG tablet   Oral   Take 25 mg by mouth 2 (two) times daily as needed for dizziness.         . mometasone (NASONEX) 50 MCG/ACT nasal spray   Nasal   Place 2 sprays into the nose 2 (two) times daily.         . mometasone-formoterol (DULERA) 100-5 MCG/ACT AERO   Inhalation   Inhale 2 puffs into the lungs daily.         . montelukast (SINGULAIR) 10 MG tablet   Oral   Take 10 mg by mouth at bedtime.         . naproxen (NAPROSYN) 500 MG tablet   Oral   Take 500 mg by mouth 2 (two) times daily as needed (for pain).         Marland Kitchen  nitrofurantoin (MACRODANTIN) 100 MG capsule   Oral   Take 1 capsule by mouth as needed.         . nitroGLYCERIN (NITROSTAT) 0.4 MG SL tablet   Sublingual   Place 0.4 mg under the tongue every 5 (five) minutes as needed for chest pain.           . pantoprazole (PROTONIX) 40 MG tablet   Oral   Take 40 mg by mouth daily.         . predniSONE (DELTASONE) 10 MG tablet   Oral   Take 10 mg by mouth daily.         . predniSONE (DELTASONE) 20 MG tablet   Oral   Take 1 tablet (20 mg total) by mouth daily.   21 tablet   0   . promethazine (PHENERGAN) 25 MG tablet   Oral   Take 1 tablet by mouth as needed.         . theophylline (THEO-24) 100 MG 24 hr capsule   Oral   Take 100 mg by mouth as needed.         . traMADol (ULTRAM) 50 MG tablet   Oral   Take 1 tablet (50 mg total) by mouth every 6 (six) hours as needed for pain.   20 tablet   0   . triamcinolone cream (KENALOG) 0.1 %   Topical   Apply 1 application topically as needed.         . Trospium Chloride 60 MG CP24   Oral   Take 60 mg by mouth daily.         . valACYclovir (VALTREX) 500 MG tablet   Oral   Take 1 tablet by mouth 2 (two) times daily.          BP 109/58  Pulse 93  Temp(Src) 98 F (36.7 C) (Oral)  Resp 13  SpO2 94% Physical Exam  Nursing note and vitals reviewed. Constitutional: She appears well-developed and well-nourished. No distress.  HENT:  Mouth/Throat: Oropharynx is clear and moist.  Eyes: Conjunctivae are normal. No scleral icterus.  Neck: Neck supple. No tracheal deviation present. No thyromegaly present.  Cardiovascular: Normal rate, regular rhythm, normal heart sounds and intact distal pulses.  Exam reveals no gallop and no friction rub.   No murmur heard. Pulmonary/Chest: Effort normal and breath sounds normal. No respiratory distress. She exhibits tenderness.  Abdominal: Soft. Normal appearance. She exhibits no distension. There is no tenderness.  Genitourinary:  No cva tenderness  Musculoskeletal: She exhibits no edema and no tenderness.  bil extremities normal color, normal warmth, distal pulse palp bil.   Neurological: She is alert.  Skin: Skin is warm and dry. No rash noted.  Psychiatric:   anxious    ED Course  Procedures (including critical care time) Labs Review Results for orders placed during the hospital encounter of 07/03/13  CBC      Result Value Range   WBC 10.4  4.0 - 10.5 K/uL   RBC 4.42  3.87 - 5.11 MIL/uL   Hemoglobin 12.9  12.0 - 15.0 g/dL   HCT 08.6  57.8 - 46.9 %   MCV 84.6  78.0 - 100.0 fL   MCH 29.2  26.0 - 34.0 pg   MCHC 34.5  30.0 - 36.0 g/dL   RDW 62.9  52.8 - 41.3 %   Platelets 262  150 - 400 K/uL  COMPREHENSIVE METABOLIC PANEL      Result Value Range  Sodium 136  135 - 145 mEq/L   Potassium 4.1  3.5 - 5.1 mEq/L   Chloride 102  96 - 112 mEq/L   CO2 22  19 - 32 mEq/L   Glucose, Bld 106 (*) 70 - 99 mg/dL   BUN 6  6 - 23 mg/dL   Creatinine, Ser 4.54  0.50 - 1.10 mg/dL   Calcium 9.5  8.4 - 09.8 mg/dL   Total Protein 6.6  6.0 - 8.3 g/dL   Albumin 3.5  3.5 - 5.2 g/dL   AST 10  0 - 37 U/L   ALT 9  0 - 35 U/L   Alkaline Phosphatase 97  39 - 117 U/L   Total Bilirubin 0.4  0.3 - 1.2 mg/dL   GFR calc non Af Amer >90  >90 mL/min   GFR calc Af Amer >90  >90 mL/min  TROPONIN I      Result Value Range   Troponin I <0.30  <0.30 ng/mL  URINALYSIS, ROUTINE W REFLEX MICROSCOPIC      Result Value Range   Color, Urine YELLOW  YELLOW   APPearance CLOUDY (*) CLEAR   Specific Gravity, Urine 1.014  1.005 - 1.030   pH 7.0  5.0 - 8.0   Glucose, UA NEGATIVE  NEGATIVE mg/dL   Hgb urine dipstick NEGATIVE  NEGATIVE   Bilirubin Urine NEGATIVE  NEGATIVE   Ketones, ur NEGATIVE  NEGATIVE mg/dL   Protein, ur NEGATIVE  NEGATIVE mg/dL   Urobilinogen, UA 0.2  0.0 - 1.0 mg/dL   Nitrite NEGATIVE  NEGATIVE   Leukocytes, UA NEGATIVE  NEGATIVE   Dg Chest 2 View  07/03/2013   CLINICAL DATA:  Chest pain  EXAM: CHEST  2 VIEW  COMPARISON:  09/05/2012.  FINDINGS: The cardiac silhouette, mediastinal and hilar contours are within normal limits and stable. There are chronic bronchitic and emphysematous type lung changes but no acute overlying pulmonary process. No pleural  effusion. The bony thorax is intact.  IMPRESSION: Chronic lung changes but no acute pulmonary findings.   Electronically Signed   By: Loralie Champagne M.D.   On: 07/03/2013 10:54   Mr Lumbar Spine Wo Contrast  06/29/2013   [facility] [address] [place], [place] [address] [phone]  NEUROIMAGING REPORT   STUDY DATE: 06/26/13 PATIENT NAME: QUINNIE BARCELO DOB: [birth year] MRN: [id number]  ORDERING CLINICIAN: Dr Marjory Lies CLINICAL HISTORY:  45 year patient with low back and leg pain COMPARISON FILMS: none EXAM: MRI Lumbar Spine wo TECHNIQUE: MRI of the lumbar spine was obtained utilizing 4 mm sagittal  slices from T11-12 down to the lower sacrum with T1, T2 and inversion  recovery views. In addition 4 mm axial slices from L1-2 down to L5-S1  level were included with T1 and T2 weighted views. CONTRAST: none IMAGING SITE: [facility] FINDINGS:  The lumbar vertebrae demonstrate normal alignment, body height and bone  marrow signal characteristics. The intervertebral discs show mild loss of  disc height and signal throughout. L1-L2 shows mild posterior facet  hypertrophy with no significant stenosis. L2-3 shows focal central disc  protrusion but without significant compression. Facet mild hypertrophy  changes are noted posteriorly without significant stenosis. L3-4 and L4-5  show mild to signal abnormalities and facet hypertrophy only. There is  mild bilateral foraminal narrowing at L4-5 without significant root  impingement. L5-S1 also shows minor facet hypertrophy. The spinal cord  terminates at L1. The visualized paraspinal soft tissues appear  unremarkable. The lower  thoracic vertebrae also show mild disc  degenerative changes     06/29/2013    Abnormal MRI scan the lumbar spine showing focal central disc  protrusion at L2-3 and mild facet and disc degenerative changes throughout  but without significant compression.   INTERPRETING PHYSICIAN:  PRAMOD SETHI, MD Certified in   Neuroimaging by American Society of Neuroimaging and [employer]       EKG Interpretation     Ventricular Rate:  93 PR Interval:  106 QRS Duration: 73 QT Interval:  353 QTC Calculation: 439 R Axis:   37 Text Interpretation:  Normal sinus rhythm Normal ECG No significant change since last tracing            MDM  Iv ns. Labs.  Pt w hx anxiety, requests med for pain and anxiety.  Xanax po. Percocet 1 po.  Reviewed nursing notes and prior charts for additional history.   Reviewed recent visits, pts cardiologists notes reviewed - no documented hx mi or heart disease, pt w normal stress test yesterday:   Impression  Exercise Capacity: Lexiscan with no exercise.  BP Response: Normal blood pressure response.  Clinical Symptoms: Mild shortness of breath  ECG Impression: No significant ST segment change suggestive of ischemia.  Comparison with Prior Nuclear Study: No significant change from previous study  Overall Impression: Normal stress nuclear study.  LV Wall Motion: NL LV Function, EF 83% post stress; NL Wall Motion  KELLY,THOMAS A, MD  07/02/2013 1:13 PM   Recheck pt feels much improved. No new c/o.   After symptoms, present, constant, x 2 days, trop neg.   Pt appears stable for d/c.     Suzi Roots, MD 07/03/13 [phone]

## 2013-07-03 NOTE — ED Notes (Signed)
Bed: WR60 Expected date: 07/03/13 Expected time: 10:14 AM Means of arrival: Ambulance Comments: Chest Pain/Pain management/ EKG WNL

## 2013-07-03 NOTE — ED Notes (Signed)
C/o chest pain x 2 weeks on and off. Today the CP started at rest, shape, central, radiates to left breast. Associated symptoms are nausea, nonproductive cough, and lightheadedness. Chest pain worst with cough, worst with palpation, worst with respiration,and worst when she is stress (frequently stressed) Nothing makes pain better. PTA pt received 324mg  ASA, and 0.4 nitroglycerin SL with relief.

## 2013-07-05 ENCOUNTER — Telehealth: Payer: Self-pay | Admitting: Diagnostic Neuroimaging

## 2013-07-05 ENCOUNTER — Telehealth: Payer: Self-pay | Admitting: *Deleted

## 2013-07-05 NOTE — Telephone Encounter (Signed)
I called pt and relayed the results of the MRI to pt. (focal central disc protrusion at L2-3 and Mild degenerative changes without compression.  She has doppler study tomorrow at 0830.  She verbalized understanding.

## 2013-07-05 NOTE — Telephone Encounter (Signed)
Message copied by Tobin Chad on Mon Jul 05, 2013  3:01 PM ------      Message from: [name]      Created: Fri Jul 02, 2013  2:34 PM       Great News, NORMAL study.  No evidence of heart artery blockages.            [name], MD       ------

## 2013-07-05 NOTE — Telephone Encounter (Signed)
Pt calling the sleep lab in error?  Says MRI done now close to two weeks ago and still no response.  Repeated requests for results have gone unanswered.  Pt is distressed at this point, she is requesting to be called asap with results or with some form of communication on when results can be expected.

## 2013-07-05 NOTE — Telephone Encounter (Signed)
Spoke to patient. Result given . Verbalized understanding Pt request a copy. She states she does not have a computer. Informed her that a copy will be given to her at her appointment on 07/27/13. She voiced understanding.

## 2013-07-06 ENCOUNTER — Ambulatory Visit (HOSPITAL_COMMUNITY)
Admission: RE | Admit: 2013-07-06 | Discharge: 2013-07-06 | Disposition: A | Discharge status: Home / Self Care | Payer: PRIVATE HEALTH INSURANCE | Source: Ambulatory Visit | Attending: Cardiovascular Disease | Admitting: Cardiovascular Disease

## 2013-07-06 DIAGNOSIS — F17200 Nicotine dependence, unspecified, uncomplicated: Secondary | ICD-10-CM

## 2013-07-06 DIAGNOSIS — M79609 Pain in unspecified limb: Secondary | ICD-10-CM | POA: Insufficient documentation

## 2013-07-06 DIAGNOSIS — R29898 Other symptoms and signs involving the musculoskeletal system: Secondary | ICD-10-CM

## 2013-07-06 HISTORY — PX: OTHER SURGICAL HISTORY: SHX169

## 2013-07-06 NOTE — Progress Notes (Signed)
Lower Extremity Arterial Duplex Completed. °Brianna L Mazza,RVT °

## 2013-07-15 ENCOUNTER — Telehealth: Payer: Self-pay | Admitting: *Deleted

## 2013-07-15 NOTE — Telephone Encounter (Signed)
Message copied by Tobin Chad on Thu Jul 15, 2013 11:30 AM ------      Message from: [name]      Created: Fri Jul 09, 2013 12:56 PM       Pretty normal Arterial dopplers or the legs.  Good News.      Not likely to be artery disease causing leg pain.            [name], MD       ------

## 2013-07-15 NOTE — Telephone Encounter (Signed)
LEFT MESSAGE TO CALL BACK

## 2013-07-15 NOTE — Telephone Encounter (Signed)
Spoke to patient. Result given . Verbalized understanding  

## 2013-07-16 ENCOUNTER — Telehealth: Payer: Self-pay | Admitting: Cardiology

## 2013-07-16 NOTE — Telephone Encounter (Signed)
Per answering service-was told you back by the nurse.

## 2013-07-16 NOTE — Telephone Encounter (Signed)
Returned call.  Pt stated message was from yesterday and she spoke w/ Jasmine December.

## 2013-07-21 ENCOUNTER — Telehealth: Payer: Self-pay | Admitting: Cardiology

## 2013-07-21 NOTE — Telephone Encounter (Signed)
Returning your call from yesterday. °

## 2013-07-22 NOTE — Telephone Encounter (Signed)
Patient wanted to know her results again for her lower extremity arterial  Doppler. Results given. Patient has an appointment 11/11 /14.

## 2013-07-27 ENCOUNTER — Ambulatory Visit (INDEPENDENT_AMBULATORY_CARE_PROVIDER_SITE_OTHER): Payer: PRIVATE HEALTH INSURANCE | Admitting: Cardiology

## 2013-07-27 ENCOUNTER — Encounter: Payer: Self-pay | Admitting: Cardiology

## 2013-07-27 VITALS — BP 102/70 | HR 84 | Ht 64.0 in | Wt 125.2 lb

## 2013-07-27 DIAGNOSIS — M940 Chondrocostal junction syndrome [Tietze]: Secondary | ICD-10-CM

## 2013-07-27 DIAGNOSIS — M79604 Pain in right leg: Secondary | ICD-10-CM

## 2013-07-27 DIAGNOSIS — R079 Chest pain, unspecified: Secondary | ICD-10-CM

## 2013-07-27 DIAGNOSIS — M79609 Pain in unspecified limb: Secondary | ICD-10-CM

## 2013-07-27 DIAGNOSIS — J449 Chronic obstructive pulmonary disease, unspecified: Secondary | ICD-10-CM

## 2013-07-27 DIAGNOSIS — F17200 Nicotine dependence, unspecified, uncomplicated: Secondary | ICD-10-CM

## 2013-07-27 NOTE — Patient Instructions (Signed)
Your physician recommends that you schedule a follow-up appointment as needed  

## 2013-07-28 NOTE — Progress Notes (Signed)
PATIENT: Lori [name] MRN: [id number]  DOB: [birth year]   DOV:07/29/2013 PCP: Pcp Not In System  Clinic Note: Chief Complaint  Patient presents with  . Follow-up    nuclear stress test & LEAs; c/o sharp pains under L breast & in chest off & on; back problems; SOB r/t COPD; occ lightheadedness/dizziness; c/o bilat LE edema in AM    HPI: Lori [name] is a 57 y.o. female with a PMH below who presents today for followup of chest pain evaluation from October 12. She had a nuclear stress test in motion we arterial Dopplers performed to evaluate leg pain. As I noted before. She is a very difficult historian with tangential speech. Very hard to stay on topic.  Interval History:  She continues in the sharp, left-sided chest discomfort under her left breast but is less significant than before, and not at all associated with any particular activity such as exertion.. She says she has some mild edema in her lower extremities the mornings. She still has aching in legs. Apparently she is supposed to go and see me about an abnormal back MRI.  She does have baseline dyspnea, but is probably more a combination of her COPD which has as chronic bronchitis features such as morning coughing with a productive cough as well as wheezing and air trapping. She denies any edema PND orthopnea. She denies any significant palpitations but does note occasional irregular heartbeats. . She also notes occasional hematuria but no melena/hematochezia.  In addition to the chest discomfort and dyspnea symptoms she also notes having to walk with a cane due to balance issues. She says her legs are subcutaneous. Weekly give out on her. She said multiple falls where she said that surgeries on both arms. This saline note discomfort in her legs, just simply weakness on walking.  The remainder of Cardiovascular ROS: positive for - chest pain, dyspnea on exertion, irregular heartbeat, orthopnea and shortness of breath negative  for - edema, murmur, palpitations, paroxysmal nocturnal dyspnea or rapid heart rate: Additional cardiac review of systems: Lightheadedness - yes, dizziness - yes, syncope/near-syncope - one episode in July; TIA/amaurosis fugax - no Melena - no, hematochezia no; hematuria - yes; nosebleeds - no; claudication - equivocal, see above  Past Medical History  Diagnosis Date  . COPD (chronic obstructive pulmonary disease) with chronic bronchitis     & Emphysema  . Acute MI     x3  . Asthma   . Barrett esophagus 2007  . Hiatal hernia [id number]  . Duodenitis without mention of hemorrhage 2007  . Esophageal stricture   . Esophageal reflux 2007  . Anxiety   . Depression   . Multiple fractures     from falls, fx rt. elbow, fx left wrist, bilateral ankles  . Allergy   . Ulcer   . H/O hiatal hernia     Prior Cardiac Evaluation and Past Surgical History: Past Surgical History  Procedure Laterality Date  . Abdominal hysterectomy    . Neck surgery    . Cholecystectomy    . Elbow fracture surgery      right  . Wrist fracture surgery      left, has plate  . Colonoscopy    . Nm myoview ltd  07/02/2013    Normal LV function, EF 83%. (Likely overestimated due to low volume);; no evidence of ischemia or infarction. Normal wall motion.  . Lower extremity arterial dopplers  07/06/2013    RABI 1.0, LABI 1.1.; No  evidence of significant vascular atherogenic plaque    Allergies  Allergen Reactions  . Bee Venom Swelling  . Codeine Nausea Only    CAUSES ULCERS  . Propoxyphene-Acetaminophen Nausea Only  . Toradol [Ketorolac Tromethamine] Nausea And Vomiting  . Hydrocodone-Acetaminophen Nausea Only    REACTION: "sick"  . Latex Rash   Current Outpatient Prescriptions  Medication Sig Dispense Refill  . acetaminophen-codeine (TYLENOL #3) 300-30 MG per tablet Take 2 tablets by mouth every 4 (four) hours as needed.      Marland Kitchen albuterol (PROAIR HFA) 108 (90 BASE) MCG/ACT inhaler Inhale 2 puffs into the  lungs every 4 (four) hours as needed for wheezing or shortness of breath.       . ALPRAZolam (XANAX) 1 MG tablet Take 1 mg by mouth 3 (three) times daily as needed for sleep or anxiety.       Marland Kitchen aspirin 81 MG tablet Take 81 mg by mouth daily.      . benzonatate (TESSALON) 100 MG capsule Take 100 mg by mouth every 6 (six) hours as needed for cough.      . butalbital-acetaminophen-caffeine (FIORICET, ESGIC) 50-325-40 MG per tablet Take 1-2 tablets by mouth every 8 (eight) hours as needed for headache.      . cyclobenzaprine (FLEXERIL) 10 MG tablet Take 1 tablet (10 mg total) by mouth 3 (three) times daily as needed for muscle spasms.  30 tablet  0  . DEXILANT 60 MG capsule Take 1 capsule by mouth as needed.       . docusate sodium (COLACE) 100 MG capsule Take 100-200 mg by mouth daily as needed for constipation.      Marland Kitchen EPINEPHrine (EPIPEN) 0.3 mg/0.3 mL DEVI Inject 0.3 mg into the muscle once. For bee stings.      Marland Kitchen estradiol (ESTRACE) 1 MG tablet Take 1 mg by mouth daily.      . folic acid (FOLVITE) 400 MCG tablet Take 400 mcg by mouth daily.       . hydrOXYzine (ATARAX/VISTARIL) 25 MG tablet Take 25 mg by mouth daily as needed.      . meclizine (ANTIVERT) 25 MG tablet Take 25 mg by mouth 2 (two) times daily as needed for dizziness.      . mometasone (NASONEX) 50 MCG/ACT nasal spray Place 2 sprays into the nose 2 (two) times daily.      . mometasone-formoterol (DULERA) 100-5 MCG/ACT AERO Inhale 2 puffs into the lungs daily.      . montelukast (SINGULAIR) 10 MG tablet Take 10 mg by mouth at bedtime.      . nitroGLYCERIN (NITROSTAT) 0.4 MG SL tablet Place 0.4 mg under the tongue every 5 (five) minutes as needed for chest pain.       . promethazine (PHENERGAN) 25 MG tablet Take 1 tablet by mouth as needed for nausea.       . traMADol (ULTRAM) 50 MG tablet Take 1 tablet (50 mg total) by mouth every 6 (six) hours as needed for pain.  20 tablet  0  . Trospium Chloride 60 MG CP24 Take 60 mg by mouth daily.       . valACYclovir (VALTREX) 500 MG tablet Take 1 tablet by mouth 2 (two) times daily.        No current facility-administered medications for this visit.  No Taking:  Folic acid, hydroxyzine, loperamide, meclizine, naproxen, nitrofurantoin, nitroglycerin, pantoprazole  History   Social History Narrative   Patient lives at home spouse Kathlene November.  Has  2 daughters (25 & 30).   Currently "disabled".   Caffeine Use: tea 3-4 cups daily. No Alcohol   Smokes ~1 1/2 PPD     Walks everyday - no set routine.   Family History: family history includes Dementia in her mother; Diabetes in her maternal aunt, maternal grandmother, and another family member; Emphysema in her father; Heart disease in her brother. There is no history of Colon cancer.   ROS: A comprehensive Review of Systems - Negative except Pertinent signs as noted above in history of present illness as well as the below Respiratory ROS: positive for - cough -mostly in the morning and productive of dark brown sputum, orthopnea, pleuritic pain, shortness of breath and wheezing Musculoskeletal ROS: positive for - Leg weakness, multiple bruises and fractures in her arms., with questionable gait disturbance negative for - She does not necessarily note joint discomfort Neurological ROS: no TIA or stroke symptoms positive for - dizziness, gait disturbance and impaired coordination/balance  PHYSICAL EXAM BP 102/70  Pulse 84  Ht 5\' 4"  (1.626 m)  Wt 125 lb 3.2 oz (56.79 kg)  BMI 21.48 kg/m2 General appearance: alert, cooperative, appears stated age, no distress  HEENT: [place]/AT, EOMI, MMM, anicteric sclera; she wears glasses, is edentulous. Neck: no adenopathy, no carotid bruit and no JVD Lungs: Diffuse mild rhonchorous breath sounds with expiratory wheezing, but non-labored, and clear to percussion. Somewhat clears with coughing. Heart: regular rate and rhythm, S1, S2 normal, no murmur, click, rub or gallop nondisplaced  -- [place] costochondral  tenderness is still noted. Abdomen: soft, non-tender; bowel sounds normal; no masses,  no organomegaly; no hepato- jugular reflux Extremities: extremities normal, atraumatic, no cyanosis, and edema none Pulses:1 + DP/PT pulses;  Skin: mobility and turgor normal or telangiectasias - arm(s) bilateral, lower leg(s) bilateral and varicosities - lower leg(s) bilateral, ankle(s) bilateral Neurologic: Mental status: Alert, oriented, thought content appropriate Cranial nerves: normal (II-XII grossly intact)   [id number] today: No   Recent Labs: None provided  ASSESSMENT / PLAN: Chest pain -  at rest and with exertion Sharp left-sided chest pain, at baseline low risk for cardiac etiology. Now with normal Myoview stress test. I was suspect the most likely diagnosis is costochondritis related to her COPD coughing.  I simply recommended NSAIDs such as Tylenol or ibuprofen.  Costochondritis Reproducible costochondral margin tenderness that is quite clearly consistent with costochondritis.  Recommended NSAIDs versus Tylenol.  Leg pain, bilateral Actually somewhat pleasantly supervising Lower Extremity Arterial Doppler results.  Arguing against claudication.  Smoking Counseling given again.  Continued to reiterate points from last visit.  I explained to her, that with a normal nuclear stress test in normal extremity Dopplers, this simply means that the damage is not yet been done. It is not a matter of if, it is a matter of when.  I recommend that she discuss options for smoking cessation with her PCP, as I will be referring her back to her PCP.     No orders of the defined types were placed in this encounter.    Followup: 3-4 weeks  Billye Nydam W. Herbie Baltimore, M.D., M.S. [facility] [address] [place], [place]  [address]  [phone] Pager # [phone]

## 2013-07-29 ENCOUNTER — Encounter: Payer: Self-pay | Admitting: Cardiology

## 2013-07-29 DIAGNOSIS — M940 Chondrocostal junction syndrome [Tietze]: Secondary | ICD-10-CM | POA: Insufficient documentation

## 2013-07-29 NOTE — Assessment & Plan Note (Signed)
Reproducible costochondral margin tenderness that is quite clearly consistent with costochondritis.  Recommended NSAIDs versus Tylenol.

## 2013-07-29 NOTE — Assessment & Plan Note (Signed)
Sharp left-sided chest pain, at baseline low risk for cardiac etiology. Now with normal Myoview stress test. I was suspect the most likely diagnosis is costochondritis related to her COPD coughing.  I simply recommended NSAIDs such as Tylenol or ibuprofen.

## 2013-07-29 NOTE — Assessment & Plan Note (Signed)
Counseling given again.  Continued to reiterate points from last visit.  I explained to her, that with a normal nuclear stress test in normal extremity Dopplers, this simply means that the damage is not yet been done. It is not a matter of if, it is a matter of when.  I recommend that she discuss options for smoking cessation with her PCP, as I will be referring her back to her PCP.

## 2013-07-29 NOTE — Assessment & Plan Note (Signed)
Actually somewhat pleasantly supervising Lower Extremity Arterial Doppler results.  Arguing against claudication.

## 2013-08-09 ENCOUNTER — Emergency Department (HOSPITAL_COMMUNITY): Payer: PRIVATE HEALTH INSURANCE

## 2013-08-09 ENCOUNTER — Encounter (HOSPITAL_COMMUNITY): Payer: Self-pay | Admitting: Emergency Medicine

## 2013-08-09 ENCOUNTER — Emergency Department (HOSPITAL_COMMUNITY)
Admission: EM | Admit: 2013-08-09 | Discharge: 2013-08-09 | Disposition: A | Discharge status: Home / Self Care | Payer: PRIVATE HEALTH INSURANCE | Attending: Emergency Medicine | Admitting: Emergency Medicine

## 2013-08-09 DIAGNOSIS — Z872 Personal history of diseases of the skin and subcutaneous tissue: Secondary | ICD-10-CM | POA: Insufficient documentation

## 2013-08-09 DIAGNOSIS — J441 Chronic obstructive pulmonary disease with (acute) exacerbation: Secondary | ICD-10-CM | POA: Insufficient documentation

## 2013-08-09 DIAGNOSIS — I252 Old myocardial infarction: Secondary | ICD-10-CM | POA: Insufficient documentation

## 2013-08-09 DIAGNOSIS — F411 Generalized anxiety disorder: Secondary | ICD-10-CM | POA: Insufficient documentation

## 2013-08-09 DIAGNOSIS — F329 Major depressive disorder, single episode, unspecified: Secondary | ICD-10-CM | POA: Insufficient documentation

## 2013-08-09 DIAGNOSIS — F3289 Other specified depressive episodes: Secondary | ICD-10-CM | POA: Insufficient documentation

## 2013-08-09 DIAGNOSIS — K219 Gastro-esophageal reflux disease without esophagitis: Secondary | ICD-10-CM | POA: Insufficient documentation

## 2013-08-09 DIAGNOSIS — Z7982 Long term (current) use of aspirin: Secondary | ICD-10-CM | POA: Insufficient documentation

## 2013-08-09 DIAGNOSIS — R110 Nausea: Secondary | ICD-10-CM | POA: Insufficient documentation

## 2013-08-09 DIAGNOSIS — Z79899 Other long term (current) drug therapy: Secondary | ICD-10-CM | POA: Insufficient documentation

## 2013-08-09 DIAGNOSIS — Z8781 Personal history of (healed) traumatic fracture: Secondary | ICD-10-CM | POA: Insufficient documentation

## 2013-08-09 DIAGNOSIS — F17200 Nicotine dependence, unspecified, uncomplicated: Secondary | ICD-10-CM | POA: Insufficient documentation

## 2013-08-09 LAB — BASIC METABOLIC PANEL
BUN: 5 mg/dL — ABNORMAL LOW (ref 6–23)
CO2: 19 mEq/L (ref 19–32)
Calcium: 9.3 mg/dL (ref 8.4–10.5)
Chloride: 106 mEq/L (ref 96–112)
Creatinine, Ser: 0.85 mg/dL (ref 0.50–1.10)
Glucose, Bld: 89 mg/dL (ref 70–99)

## 2013-08-09 LAB — POCT I-STAT TROPONIN I

## 2013-08-09 LAB — BLOOD GAS, VENOUS
Acid-base deficit: 1.1 mmol/L (ref 0.0–2.0)
Bicarbonate: 22.6 mEq/L (ref 20.0–24.0)
FIO2: 0.21 %
O2 Saturation: 79.4 %
Patient temperature: 98.6
pO2, Ven: 43 mmHg (ref 30.0–45.0)

## 2013-08-09 LAB — URINALYSIS, ROUTINE W REFLEX MICROSCOPIC
Glucose, UA: NEGATIVE mg/dL
Ketones, ur: NEGATIVE mg/dL
Nitrite: NEGATIVE
Specific Gravity, Urine: 1.008 (ref 1.005–1.030)
pH: 6.5 (ref 5.0–8.0)

## 2013-08-09 LAB — CBC
HCT: 37.2 % (ref 36.0–46.0)
Hemoglobin: 12.6 g/dL (ref 12.0–15.0)
MCV: 83.6 fL (ref 78.0–100.0)
RBC: 4.45 MIL/uL (ref 3.87–5.11)
WBC: 11.5 10*3/uL — ABNORMAL HIGH (ref 4.0–10.5)

## 2013-08-09 LAB — CARBOXYHEMOGLOBIN: Methemoglobin: 1.4 % (ref 0.0–1.5)

## 2013-08-09 MED ORDER — ALBUTEROL SULFATE (5 MG/ML) 0.5% IN NEBU
5.0000 mg | INHALATION_SOLUTION | RESPIRATORY_TRACT | Status: AC
  Administered 2013-08-09 (×2): 5 mg via RESPIRATORY_TRACT
  Filled 2013-08-09 (×2): qty 1

## 2013-08-09 MED ORDER — IPRATROPIUM BROMIDE 0.02 % IN SOLN
0.5000 mg | RESPIRATORY_TRACT | Status: AC
  Administered 2013-08-09 (×2): 0.5 mg via RESPIRATORY_TRACT
  Filled 2013-08-09 (×2): qty 2.5

## 2013-08-09 MED ORDER — METHYLPREDNISOLONE SODIUM SUCC 125 MG IJ SOLR
125.0000 mg | Freq: Once | INTRAMUSCULAR | Status: AC
  Administered 2013-08-09: 125 mg via INTRAVENOUS
  Filled 2013-08-09: qty 2

## 2013-08-09 MED ORDER — SODIUM CHLORIDE 0.9 % IV BOLUS (SEPSIS)
1000.0000 mL | Freq: Once | INTRAVENOUS | Status: AC
  Administered 2013-08-09: 1000 mL via INTRAVENOUS

## 2013-08-09 MED ORDER — PREDNISONE 20 MG PO TABS: 60.0000 mg | ORAL_TABLET | Freq: Every day | ORAL | Status: DC

## 2013-08-09 MED ORDER — HALOPERIDOL LACTATE 5 MG/ML IJ SOLN
5.0000 mg | Freq: Once | INTRAMUSCULAR | Status: AC
  Administered 2013-08-09: 5 mg via INTRAMUSCULAR
  Filled 2013-08-09: qty 1

## 2013-08-09 NOTE — ED Provider Notes (Signed)
TIME SEEN: 5:27 PM  CHIEF COMPLAINT: Generalized weakness, fatigue, nausea, shortness of breath  HPI: Patient is a 57 year-old female with history of prior MIs, COPD, depression who presents the emergency department with 2 weeks of generalized weakness and fatigue. She reports that her sister forced her to come to the emergency department today for evaluation. She has had some nausea and shortness of breath. Denies a chest pain, abdominal pain, vomiting or diarrhea. No fever. No cough. States she has been smelling kerosine fumes in her house the past several days from a "oil burner" that she uses to heat her home.    ROS: See HPI Constitutional: no fever  Eyes: no drainage  ENT: no runny nose   Cardiovascular:  no chest pain  Resp:  SOB  GI: no vomiting GU: no dysuria Integumentary: no rash  Allergy: no hives  Musculoskeletal: no leg swelling  Neurological: no slurred speech ROS otherwise negative  PAST MEDICAL HISTORY/PAST SURGICAL HISTORY:  Past Medical History  Diagnosis Date  . COPD (chronic obstructive pulmonary disease) with chronic bronchitis     & Emphysema  . Acute MI     x3  . Asthma   . Barrett esophagus 2007  . Hiatal hernia [id number]  . Duodenitis without mention of hemorrhage 2007  . Esophageal stricture   . Esophageal reflux 2007  . Anxiety   . Depression   . Multiple fractures     from falls, fx rt. elbow, fx left wrist, bilateral ankles  . Allergy   . Ulcer   . H/O hiatal hernia     MEDICATIONS:  Prior to Admission medications   Medication Sig Start Date End Date Taking? Authorizing Provider  acetaminophen-codeine (TYLENOL #3) 300-30 MG per tablet Take 2 tablets by mouth every 4 (four) hours as needed. 07/22/13   Historical Provider, MD  albuterol (PROAIR HFA) 108 (90 BASE) MCG/ACT inhaler Inhale 2 puffs into the lungs every 4 (four) hours as needed for wheezing or shortness of breath.     Historical Provider, MD  ALPRAZolam Prudy Feeler) 1 MG tablet Take 1 mg  by mouth 3 (three) times daily as needed for sleep or anxiety.     Historical Provider, MD  aspirin 81 MG tablet Take 81 mg by mouth daily.    Historical Provider, MD  benzonatate (TESSALON) 100 MG capsule Take 100 mg by mouth every 6 (six) hours as needed for cough.    Historical Provider, MD  butalbital-acetaminophen-caffeine (FIORICET, ESGIC) 50-325-40 MG per tablet Take 1-2 tablets by mouth every 8 (eight) hours as needed for headache.    Historical Provider, MD  cyclobenzaprine (FLEXERIL) 10 MG tablet Take 1 tablet (10 mg total) by mouth 3 (three) times daily as needed for muscle spasms. 04/08/13   Kaitlyn Szekalski, PA-C  DEXILANT 60 MG capsule Take 1 capsule by mouth as needed.  05/21/13   Historical Provider, MD  docusate sodium (COLACE) 100 MG capsule Take 100-200 mg by mouth daily as needed for constipation.    Historical Provider, MD  EPINEPHrine (EPIPEN) 0.3 mg/0.3 mL DEVI Inject 0.3 mg into the muscle once. For bee stings.    Historical Provider, MD  estradiol (ESTRACE) 1 MG tablet Take 1 mg by mouth daily. 06/18/13   Historical Provider, MD  folic acid (FOLVITE) 400 MCG tablet Take 400 mcg by mouth daily.     Historical Provider, MD  hydrOXYzine (ATARAX/VISTARIL) 25 MG tablet Take 25 mg by mouth daily as needed.    Historical Provider, MD  meclizine (ANTIVERT) 25 MG tablet Take 25 mg by mouth 2 (two) times daily as needed for dizziness.    Historical Provider, MD  mometasone (NASONEX) 50 MCG/ACT nasal spray Place 2 sprays into the nose 2 (two) times daily.    Historical Provider, MD  mometasone-formoterol (DULERA) 100-5 MCG/ACT AERO Inhale 2 puffs into the lungs daily.    Historical Provider, MD  montelukast (SINGULAIR) 10 MG tablet Take 10 mg by mouth at bedtime.    Historical Provider, MD  nitroGLYCERIN (NITROSTAT) 0.4 MG SL tablet Place 0.4 mg under the tongue every 5 (five) minutes as needed for chest pain.     Historical Provider, MD  promethazine (PHENERGAN) 25 MG tablet Take 1 tablet  by mouth as needed for nausea.  05/21/13   Historical Provider, MD  traMADol (ULTRAM) 50 MG tablet Take 1 tablet (50 mg total) by mouth every 6 (six) hours as needed for pain. 07/03/13   Suzi Roots, MD  Trospium Chloride 60 MG CP24 Take 60 mg by mouth daily.    Historical Provider, MD  valACYclovir (VALTREX) 500 MG tablet Take 1 tablet by mouth 2 (two) times daily.  05/16/13   Historical Provider, MD    ALLERGIES:  Allergies  Allergen Reactions  . Bee Venom Swelling  . Codeine Nausea Only    CAUSES ULCERS  . Propoxyphene-Acetaminophen Nausea Only  . Toradol [Ketorolac Tromethamine] Nausea And Vomiting  . Hydrocodone-Acetaminophen Nausea Only    REACTION: "sick"  . Latex Rash    SOCIAL HISTORY:  History  Substance Use Topics  . Smoking status: Current Every Day Smoker -- 0.50 packs/day for 21 years    Types: Cigarettes  . Smokeless tobacco: Never Used     Comment: tobaccco info given 03/06/12  . Alcohol Use: No    FAMILY HISTORY: Family History  Problem Relation Age of Onset  . Emphysema Father   . Colon cancer Neg Hx   . Diabetes Maternal Grandmother   . Diabetes Maternal Aunt   . Diabetes      mat. cousin  . Heart disease Brother   . Dementia Mother     EXAM: BP 120/47  Pulse 88  Temp(Src) 99 F (37.2 C) (Oral)  Resp 21  SpO2 98% CONSTITUTIONAL: Alert and oriented and responds appropriately to questions.  Nontoxic, appears older than stated age, drowsy but arousable HEAD: Normocephalic EYES: Conjunctivae clear, PERRL ENT: normal nose; no rhinorrhea; dry mucous membranes; pharynx without lesions noted NECK: Supple, no meningismus, no LAD  CARD: RRR; S1 and S2 appreciated; no murmurs, no clicks, no rubs, no gallops RESP: Normal chest excursion without splinting or tachypnea; breath diminished bilaterally with no wheezing, rhonchi or rales ABD/GI: Normal bowel sounds; non-distended; soft, non-tender, no rebound, no guarding BACK:  The back appears normal and is  non-tender to palpation, there is no CVA tenderness EXT: Normal ROM in all joints; non-tender to palpation; no edema; normal capillary refill; no cyanosis    SKIN: Normal color for age and race; warm NEURO: Moves all extremities equally; sensation to light touch intact diffusely, cranial nerves II through XII intact, no drift PSYCH: The patient's mood and manner are appropriate. Grooming and personal hygiene are appropriate.  MEDICAL DECISION MAKING: Patient here with shortness of breath, nausea, generalized weakness and fatigue. Patient has significant cardiac history and COPD. We'll obtain troponin, chest x-ray. EKG shows no acute abnormalities. Also concern for possible carbon monoxide exposure.  Will obtain ABG and carboxyhemoglobin.  Will keep on 100%  O2.  Will give duonebs, Solumedrol for possible COPD exacerbation as well.  ED PROGRESS: Patient is mildly confused and drowsy. She continues to attempt to leave the emergency department. Her family is now at bedside. Multiple times to redirect patient are unsuccessful. Will give Haldol IM as I feel patient needs further evaluation for her confusion to rule out possible monoxide poisoning versus CO2 retention from COPD exacerbation.  Patient's VBG shows PCO2 of 35.6.   Carboxyhemoglobin is 3.4 which is expected as pt is a smoker.  Other labs unremarkable other than mild leukocytosis.  Troponin negative.  I do not feel patient also enzymes at this time given her symptoms and present for 2 weeks. Urine pending.  Urine shows trace leukocytes and trace hemoglobin but patient also has many squamous epithelial cells. Suspect this is a dirty catch. Urine culture is pending.  Lungs clear to auscultation after nebs.    Chest x-ray is unremarkable.  Pt reports feeling better.  Will dc to her sister's house.  Fire department is currently checking patient's house for gas leak.  Given return precautions and PCP followup. Will discharge with prescription for  prednisone for COPD exacerbation.  EKG Interpretation    Date/Time:  Monday August 09 2013 17:15:07 EST Ventricular Rate:  88 PR Interval:  121 QRS Duration: 76 QT Interval:  360 QTC Calculation: 435 R Axis:   45 Text Interpretation:  Sinus rhythm Normal ECG Confirmed by Jaria Conway  DO, Diondra Pines ([id number]) on 08/09/2013 5:50:04 PM             Layla Maw Connell Bognar, DO 08/09/13 [id number]

## 2013-08-09 NOTE — ED Notes (Signed)
Pt c/o of toxic inhalation of kerosine fumes x2 days due to malfunction. C/o of nausea, confusion, red spots on belly area.

## 2013-08-09 NOTE — ED Notes (Signed)
Pt up at nurses station refusing medical treatment. MD notified.

## 2013-08-09 NOTE — ED Notes (Signed)
Bed: RESA Expected date:  Expected time:  Means of arrival:  Comments: Ems/ possible co2 from gas leak?

## 2013-08-11 LAB — URINE CULTURE: Colony Count: >=100000

## 2013-09-09 ENCOUNTER — Emergency Department (HOSPITAL_COMMUNITY): Payer: PRIVATE HEALTH INSURANCE

## 2013-09-09 ENCOUNTER — Emergency Department (HOSPITAL_COMMUNITY)
Admission: EM | Admit: 2013-09-09 | Discharge: 2013-09-09 | Disposition: A | Discharge status: Home / Self Care | Payer: PRIVATE HEALTH INSURANCE | Attending: Emergency Medicine | Admitting: Emergency Medicine

## 2013-09-09 ENCOUNTER — Encounter (HOSPITAL_COMMUNITY): Payer: Self-pay | Admitting: Emergency Medicine

## 2013-09-09 DIAGNOSIS — Z8659 Personal history of other mental and behavioral disorders: Secondary | ICD-10-CM | POA: Insufficient documentation

## 2013-09-09 DIAGNOSIS — M25562 Pain in left knee: Secondary | ICD-10-CM

## 2013-09-09 DIAGNOSIS — S46909A Unspecified injury of unspecified muscle, fascia and tendon at shoulder and upper arm level, unspecified arm, initial encounter: Secondary | ICD-10-CM | POA: Insufficient documentation

## 2013-09-09 DIAGNOSIS — J4489 Other specified chronic obstructive pulmonary disease: Secondary | ICD-10-CM | POA: Insufficient documentation

## 2013-09-09 DIAGNOSIS — Y9301 Activity, walking, marching and hiking: Secondary | ICD-10-CM | POA: Insufficient documentation

## 2013-09-09 DIAGNOSIS — J449 Chronic obstructive pulmonary disease, unspecified: Secondary | ICD-10-CM | POA: Insufficient documentation

## 2013-09-09 DIAGNOSIS — Z8719 Personal history of other diseases of the digestive system: Secondary | ICD-10-CM | POA: Insufficient documentation

## 2013-09-09 DIAGNOSIS — Z8781 Personal history of (healed) traumatic fracture: Secondary | ICD-10-CM | POA: Insufficient documentation

## 2013-09-09 DIAGNOSIS — S4980XA Other specified injuries of shoulder and upper arm, unspecified arm, initial encounter: Secondary | ICD-10-CM | POA: Insufficient documentation

## 2013-09-09 DIAGNOSIS — Y9289 Other specified places as the place of occurrence of the external cause: Secondary | ICD-10-CM | POA: Insufficient documentation

## 2013-09-09 DIAGNOSIS — M79602 Pain in left arm: Secondary | ICD-10-CM

## 2013-09-09 DIAGNOSIS — S8990XA Unspecified injury of unspecified lower leg, initial encounter: Secondary | ICD-10-CM | POA: Insufficient documentation

## 2013-09-09 DIAGNOSIS — F17200 Nicotine dependence, unspecified, uncomplicated: Secondary | ICD-10-CM | POA: Insufficient documentation

## 2013-09-09 DIAGNOSIS — I252 Old myocardial infarction: Secondary | ICD-10-CM | POA: Insufficient documentation

## 2013-09-09 DIAGNOSIS — S59909A Unspecified injury of unspecified elbow, initial encounter: Secondary | ICD-10-CM | POA: Insufficient documentation

## 2013-09-09 DIAGNOSIS — W010XXA Fall on same level from slipping, tripping and stumbling without subsequent striking against object, initial encounter: Secondary | ICD-10-CM | POA: Insufficient documentation

## 2013-09-09 DIAGNOSIS — Z91040 Latex allergy status: Secondary | ICD-10-CM | POA: Insufficient documentation

## 2013-09-09 DIAGNOSIS — Z9889 Other specified postprocedural states: Secondary | ICD-10-CM | POA: Insufficient documentation

## 2013-09-09 DIAGNOSIS — Z872 Personal history of diseases of the skin and subcutaneous tissue: Secondary | ICD-10-CM | POA: Insufficient documentation

## 2013-09-09 DIAGNOSIS — Z23 Encounter for immunization: Secondary | ICD-10-CM | POA: Insufficient documentation

## 2013-09-09 DIAGNOSIS — IMO0002 Reserved for concepts with insufficient information to code with codable children: Secondary | ICD-10-CM | POA: Insufficient documentation

## 2013-09-09 DIAGNOSIS — S6990XA Unspecified injury of unspecified wrist, hand and finger(s), initial encounter: Secondary | ICD-10-CM | POA: Insufficient documentation

## 2013-09-09 MED ORDER — OXYCODONE-ACETAMINOPHEN 5-325 MG PO TABS
1.0000 | ORAL_TABLET | Freq: Once | ORAL | Status: AC
  Administered 2013-09-09: 2 via ORAL
  Filled 2013-09-09: qty 2

## 2013-09-09 MED ORDER — ACETAMINOPHEN-CODEINE #3 300-30 MG PO TABS: 1.0000 | ORAL_TABLET | Freq: Four times a day (QID) | ORAL | Status: DC | PRN

## 2013-09-09 MED ORDER — ONDANSETRON 8 MG PO TBDP
8.0000 mg | ORAL_TABLET | Freq: Once | ORAL | Status: AC
  Administered 2013-09-09: 8 mg via ORAL
  Filled 2013-09-09: qty 1

## 2013-09-09 MED ORDER — TETANUS-DIPHTH-ACELL PERTUSSIS 5-2.5-18.5 LF-MCG/0.5 IM SUSP
0.5000 mL | Freq: Once | INTRAMUSCULAR | Status: AC
  Administered 2013-09-09: 0.5 mL via INTRAMUSCULAR
  Filled 2013-09-09: qty 0.5

## 2013-09-09 NOTE — ED Notes (Signed)
Pt in via EMS, c/o fall in parking lot. Pt c/o L arm pain, L knee pain, lower back pain. Pt did not hit head or have LOC. Pt is A&O and in NAD

## 2013-09-09 NOTE — ED Notes (Signed)
Pt states that she was walking in a parking lot, tripped and fell. Pt c/o L arm pain that she states she has a plate in already, L knee pain and lower back pain. Pt denies hitting head or LOC. Pt is A&O and in NAD

## 2013-09-09 NOTE — ED Provider Notes (Signed)
CSN: [id number]     Arrival date & time 09/09/13  1027 History   First MD Initiated Contact with Patient 09/09/13 1043     Chief Complaint  Patient presents with  . Fall  . Arm Pain  . Leg Pain  . Back Pain   (Consider location/radiation/quality/duration/timing/severity/associated sxs/prior Treatment) HPI Comments: Patient is a 57 year old female past medical history significant for COPD, asthma, history of MI, anxiety, depression presented to the emergency department after she tripped and fell while walking any prolonged bending on her left arm and leg. She is now complaining of severe left arm and knee pain. She's describing it as sharp in nature without any alleviating factors. She states moving in palpating both areas aggravate her pain. She denies hitting her head or losing consciousness. Patient states she has a history of left wrist surgery with plate placement that she is concerned may have moved or been injured. Patient denies any precipitating chest pain, shortness of breath, headache prior to the fall. Patient is requesting tetanus update.   Past Medical History  Diagnosis Date  . COPD (chronic obstructive pulmonary disease) with chronic bronchitis     & Emphysema  . Acute MI     x3  . Asthma   . Barrett esophagus 2007  . Hiatal hernia [id number]  . Duodenitis without mention of hemorrhage 2007  . Esophageal stricture   . Esophageal reflux 2007  . Anxiety   . Depression   . Multiple fractures     from falls, fx rt. elbow, fx left wrist, bilateral ankles  . Allergy   . Ulcer   . H/O hiatal hernia    Past Surgical History  Procedure Laterality Date  . Abdominal hysterectomy    . Neck surgery    . Cholecystectomy    . Elbow fracture surgery      right  . Wrist fracture surgery      left, has plate  . Colonoscopy    . Nm myoview ltd  07/02/2013    Normal LV function, EF 83%. (Likely overestimated due to low volume);; no evidence of ischemia or infarction. Normal  wall motion.  . Lower extremity arterial dopplers  07/06/2013    RABI 1.0, LABI 1.1.; No evidence of significant vascular atherogenic plaque   Family History  Problem Relation Age of Onset  . Emphysema Father   . Colon cancer Neg Hx   . Diabetes Maternal Grandmother   . Diabetes Maternal Aunt   . Diabetes      mat. cousin  . Heart disease Brother   . Dementia Mother    History  Substance Use Topics  . Smoking status: Current Every Day Smoker -- 0.50 packs/day for 21 years    Types: Cigarettes  . Smokeless tobacco: Never Used     Comment: tobaccco info given 03/06/12  . Alcohol Use: No   OB History   Grav Para Term Preterm Abortions TAB SAB Ect Mult Living                 Review of Systems  Constitutional: Negative for fever and chills.  Eyes: Negative for visual disturbance.  Respiratory: Negative for shortness of breath.   Cardiovascular: Negative for chest pain.  Gastrointestinal: Negative for nausea and vomiting.  Musculoskeletal: Positive for arthralgias, back pain and myalgias. Negative for gait problem, joint swelling, neck pain and neck stiffness.  Neurological: Negative for dizziness, syncope, weakness, light-headedness, numbness and headaches.  All other systems reviewed and are negative.  Allergies  Bee venom; Codeine; Propoxyphene n-acetaminophen; Toradol; Hydrocodone-acetaminophen; and Latex  Home Medications   Current Outpatient Rx  Name  Route  Sig  Dispense  Refill  . acetaminophen-codeine (TYLENOL #3) 300-30 MG per tablet   Oral   Take 1-2 tablets by mouth every 6 (six) hours as needed for moderate pain.   15 tablet   0    BP 120/84  Pulse 90  Temp(Src) 97.7 F (36.5 C) (Oral)  Resp 18  SpO2 99% Physical Exam  Constitutional: She is oriented to person, place, and time. She appears well-developed and well-nourished. No distress.  HENT:  Head: Normocephalic and atraumatic.  Right Ear: External ear normal.  Left Ear: External ear normal.    Nose: Nose normal.  Mouth/Throat: Oropharynx is clear and moist. No oropharyngeal exudate.  Eyes: Conjunctivae and EOM are normal. Pupils are equal, round, and reactive to light.  Neck: Normal range of motion. Neck supple.  Cardiovascular: Normal rate, regular rhythm, normal heart sounds and intact distal pulses.   Pulmonary/Chest: Effort normal and breath sounds normal. No respiratory distress.  Abdominal: Soft. There is no tenderness.  Musculoskeletal: She exhibits tenderness.       Right shoulder: Normal.       Left shoulder: She exhibits tenderness. She exhibits no bony tenderness, no swelling, no effusion, no crepitus, no deformity, no laceration, no pain, no spasm, normal pulse and normal strength.       Right upper arm: Normal.       Left upper arm: She exhibits tenderness. She exhibits no bony tenderness, no swelling, no edema, no deformity and no laceration.       Right forearm: Normal.       Left forearm: She exhibits tenderness. She exhibits no bony tenderness, no swelling, no edema, no deformity and no laceration.       Right hand: Normal.       Left hand: She exhibits tenderness. She exhibits no bony tenderness, normal two-point discrimination, normal capillary refill, no deformity, no laceration and no swelling.       Hands: Patient refusing to move arm during examination d/t pain, but is freely moving arm during transport to X-ray. Small abrasion to right pinky finger. Bleeding controlled. Nontender to palpation.  Neurological: She is alert and oriented to person, place, and time. She has normal strength. No cranial nerve deficit or sensory deficit. Gait normal. GCS eye subscore is 4. GCS verbal subscore is 5. GCS motor subscore is 6.  No pronator drift. Bilateral heel-knee-shin intact.  Skin: Skin is warm and dry. She is not diaphoretic.    ED Course  Procedures (including critical care time) Medications  ondansetron (ZOFRAN-ODT) disintegrating tablet 8 mg (8 mg Oral Given  09/09/13 1133)  oxyCODONE-acetaminophen (PERCOCET/ROXICET) 5-325 MG per tablet 1-2 tablet (2 tablets Oral Given 09/09/13 1133)  Tdap (BOOSTRIX) injection 0.5 mL (0.5 mLs Intramuscular Given 09/09/13 1201)    Labs Review Labs Reviewed - No data to display Imaging Review Dg Forearm Left  09/09/2013   CLINICAL DATA:  Fall, arm pain  EXAM: LEFT FOREARM - 2 VIEW  COMPARISON:  02/05/2009  FINDINGS: Previous left distal radius fracture, s/p ORIF. Healed left distal ulna fracture. Normal alignment. No acute fracture. No soft tissue abnormality.  IMPRESSION: No acute osseous finding.  Stable postoperative findings   Electronically Signed   By: Ruel Favors M.D.   On: 09/09/2013 11:47   Dg Knee Complete 4 Views Left  09/09/2013   CLINICAL DATA:  Fall, left knee pain, abrasion  EXAM: LEFT KNEE - COMPLETE 4+ VIEW  COMPARISON:  None.  FINDINGS: Bones are osteopenic. Normal alignment without fracture or effusion. Preserved joint spaces. No significant arthropathy.  IMPRESSION: No acute osseous finding.  Osteopenia.   Electronically Signed   By: Ruel Favors M.D.   On: 09/09/2013 11:45   Dg Humerus Left  09/09/2013   CLINICAL DATA:  Fall, left arm pain  EXAM: LEFT HUMERUS - 2+ VIEW  COMPARISON:  02/10/2005  FINDINGS: Left humerus intact. Normal alignment. No fracture evident. Soft tissues unremarkable.  IMPRESSION: No acute osseous finding   Electronically Signed   By: Ruel Favors M.D.   On: 09/09/2013 11:46    EKG Interpretation   None       MDM   1. Left arm pain   2. Left knee pain    Afebrile, NAD, non-toxic appearing, AAOx4. Patient presenting after mechanical fall complaining of left arm and knee and low back pain. Neurovascularly intact. Normal sensation. No red flags for back pain. Patient X-Rays negative for obvious fracture or dislocation. Pain managed in ED. Pt advised to follow up with orthopedics if symptoms persist for possibility of missed fracture diagnosis. Patient given brace  while in ED, conservative therapy recommended and discussed. Return precautions discussed. Patient agreeable to plan. Patient will be dc home & is agreeable with above plan. Patient stable at time of discharge.      Jeannetta Ellis, PA-C 09/09/13 1614

## 2013-09-10 NOTE — ED Provider Notes (Signed)
Medical screening examination/treatment/procedure(s) were performed by non-physician practitioner and as supervising physician I was immediately available for consultation/collaboration.  EKG Interpretation   None         Dagmar Hait, MD 09/10/13 [phone]

## 2013-09-21 ENCOUNTER — Ambulatory Visit (INDEPENDENT_AMBULATORY_CARE_PROVIDER_SITE_OTHER): Payer: PRIVATE HEALTH INSURANCE | Admitting: Diagnostic Neuroimaging

## 2013-09-21 ENCOUNTER — Encounter: Payer: Self-pay | Admitting: Diagnostic Neuroimaging

## 2013-09-21 VITALS — BP 135/75 | HR 75 | Ht 64.0 in | Wt 119.0 lb

## 2013-09-21 DIAGNOSIS — F32A Depression, unspecified: Secondary | ICD-10-CM

## 2013-09-21 DIAGNOSIS — R269 Unspecified abnormalities of gait and mobility: Secondary | ICD-10-CM

## 2013-09-21 DIAGNOSIS — F411 Generalized anxiety disorder: Secondary | ICD-10-CM

## 2013-09-21 DIAGNOSIS — M79609 Pain in unspecified limb: Secondary | ICD-10-CM

## 2013-09-21 DIAGNOSIS — F329 Major depressive disorder, single episode, unspecified: Secondary | ICD-10-CM

## 2013-09-21 DIAGNOSIS — F3289 Other specified depressive episodes: Secondary | ICD-10-CM

## 2013-09-21 DIAGNOSIS — M79671 Pain in right foot: Secondary | ICD-10-CM

## 2013-09-21 NOTE — Progress Notes (Signed)
[facility]  PATIENT: Lori [name] DOB: [birth year]  REFERRING CLINICIAN:  HISTORY FROM: patient, friend, husband REASON FOR VISIT: follow up   HISTORICAL  CHIEF COMPLAINT:  Chief Complaint  Patient presents with  . Follow-up    low back pain #6    HISTORY OF PRESENT ILLNESS:   UPDATE 09/21/13: Since last visit, continues to have pain (now right foot, but also chest pain, arm pain, back pain). Had cardiology and ER evaluation of chest pain, with unremarkable test results.  My evaluation from last visit also unremarkable (MRI lumbar, labs).    Patient still under significant psychosocial stress (family, siblings, mother) and patient begins to cry in the office. Husband and friend agree that stress is a sig factor. She declines to see psychiatry. She takes xanax at bedtime.  PRIOR HPI (06/14/13): 58 year old right-handed female with hypercholesterolemia, heart disease, migraine, depression, anxiety, smoking, here for evaluation of lower extremity pain.  July 2014 patient was at home, with nausea and vomiting and passed out. She fell down to the ground and patient's husband found her at home in a semi-conscious state. Patient was taken to the emergency room for further evaluation. Apparently she was diagnosed with low blood pressure and anemia.  Ever since that time she's had low back pain, bilateral leg pain and weakness. She saw orthopedic surgery without a specific diagnosis. She also has diffuse upper and lower extremity weakness and pain.   REVIEW OF SYSTEMS: Full 14 system review of systems performed and notable only for dizziness headache weakness passing out nervousness anxiety pain joint swelling back pain aching muscles muscle cramps walking difficulty blood and urine environmental allergies vessels legs diarrhea nausea vomiting cough wheezing shortness of breath.  ALLERGIES: Allergies  Allergen Reactions  . Bee Venom Swelling  . Codeine Nausea  Only    CAUSES ULCERS  . Propoxyphene N-Acetaminophen Nausea Only  . Toradol [Ketorolac Tromethamine] Nausea And Vomiting  . Hydrocodone-Acetaminophen Nausea Only    REACTION: "sick"  . Latex Rash    HOME MEDICATIONS: Outpatient Encounter Prescriptions as of 09/21/2013  Medication Sig  . ALPRAZolam (XANAX) 1 MG tablet Take 10 mg by mouth as needed.  . sulfamethoxazole-trimethoprim (BACTRIM,SEPTRA) 200-40 MG/5ML suspension 2 (two) times daily.   . [DISCONTINUED] acetaminophen-codeine (TYLENOL #3) 300-30 MG per tablet Take 1-2 tablets by mouth every 6 (six) hours as needed for moderate pain.    PAST MEDICAL HISTORY: Past Medical History  Diagnosis Date  . COPD (chronic obstructive pulmonary disease) with chronic bronchitis     & Emphysema  . Acute MI     x3  . Asthma   . Barrett esophagus 2007  . Hiatal hernia [id number]  . Duodenitis without mention of hemorrhage 2007  . Esophageal stricture   . Esophageal reflux 2007  . Anxiety   . Depression   . Multiple fractures     from falls, fx rt. elbow, fx left wrist, bilateral ankles  . Allergy   . Ulcer   . H/O hiatal hernia     PAST SURGICAL HISTORY: Past Surgical History  Procedure Laterality Date  . Abdominal hysterectomy    . Neck surgery    . Cholecystectomy    . Elbow fracture surgery      right  . Wrist fracture surgery      left, has plate  . Colonoscopy    . Nm myoview ltd  07/02/2013    Normal LV function, EF 83%. (Likely overestimated due to low volume);;  no evidence of ischemia or infarction. Normal wall motion.  . Lower extremity arterial dopplers  07/06/2013    RABI 1.0, LABI 1.1.; No evidence of significant vascular atherogenic plaque    FAMILY HISTORY: Family History  Problem Relation Age of Onset  . Emphysema Father   . Colon cancer Neg Hx   . Diabetes Maternal Grandmother   . Diabetes Maternal Aunt   . Diabetes      mat. cousin  . Heart disease Brother   . Dementia Mother     SOCIAL  HISTORY:  History   Social History  . Marital Status: Married    Spouse Name: [name]    Number of Children: 2  . Years of Education: HS   Occupational History  . disable    Social History Main Topics  . Smoking status: Current Every Day Smoker -- 0.50 packs/day for 21 years    Types: Cigarettes  . Smokeless tobacco: Never Used     Comment: tobaccco info given 03/06/12  . Alcohol Use: No  . Drug Use: No  . Sexual Activity: Not on file   Other Topics Concern  . Not on file   Social History Narrative   Patient lives at home spouse - [name].  Has 2 daughters (1 & 80).   Currently "disabled".   Caffeine Use: tea 3-4 cups daily. No Alcohol   Smokes ~1 1/2 PPD     Walks everyday - no set routine.     PHYSICAL EXAM  Filed Vitals:   09/21/13 0816  BP: 135/75  Pulse: 75  Height: 5\' 4"  (1.626 m)  Weight: 119 lb (53.978 kg)    Not recorded    Body mass index is 20.42 kg/(m^2).  GENERAL EXAM: Patient is in no distress; STRONG SMELL OF CIG SMOKE. RIGHT FOOT ANKLE TENDER TO PALPATION AND ROM TESTING.  CARDIOVASCULAR: Regular rate and rhythm, no murmurs, no carotid bruits  NEUROLOGIC: MENTAL STATUS: awake, alert, language fluent, comprehension intact, naming intact CRANIAL NERVE: pupils equal and reactive to light, visual fields full to confrontation, extraocular muscles intact, no nystagmus, facial sensation and strength symmetric, uvula midline, shoulder shrug symmetric, tongue midline. MOTOR: BUE AND LLE 5. RLE LIMITED BY PAIN (2-3) SENSORY: normal and symmetric to light touch; HYPERSENSITIVE IN FEET. COORDINATION: finger-nose-finger, fine finger movements normal REFLEXES: deep tendon reflexes present and symmetric GAIT/STATION: ANTALGIC, SLOW GAIT. UNSTEADY.   DIAGNOSTIC DATA (LABS, IMAGING, TESTING) - I reviewed patient records, labs, notes, testing and imaging myself where available.  Lab Results  Component Value Date   WBC 11.5* 08/09/2013   HGB 12.6  08/09/2013   HCT 37.2 08/09/2013   MCV 83.6 08/09/2013   PLT 290 08/09/2013      Component Value Date/Time   NA 138 08/09/2013 1757   K 3.4* 08/09/2013 1757   CL 106 08/09/2013 1757   CO2 19 08/09/2013 1757   GLUCOSE 89 08/09/2013 1757   BUN 5* 08/09/2013 1757   CREATININE 0.85 08/09/2013 1757   CALCIUM 9.3 08/09/2013 1757   PROT 6.6 07/03/2013 1111   ALBUMIN 3.5 07/03/2013 1111   AST 10 07/03/2013 1111   ALT 9 07/03/2013 1111   ALKPHOS 97 07/03/2013 1111   BILITOT 0.4 07/03/2013 1111   GFRNONAA 75* 08/09/2013 1757   GFRAA 87* 08/09/2013 1757   No results found for this basename: CHOL,  HDL,  LDLCALC,  LDLDIRECT,  TRIG,  CHOLHDL   Lab Results  Component Value Date   HGBA1C 5.4 06/14/2013  Lab Results  Component Value Date   HWKGSUPJ03 159 06/14/2013   Lab Results  Component Value Date   TSH 2.520 06/14/2013    01/20/13 CT cervical spine: There is anterior cervical fusion from C3-C5. There is a  bony bridging of the cervical spine from C3-C7. No evidence of  acute fracture. Normal facet articulation. Normal craniocervical  junction. There is no prevertebral soft tissue swelling. No evidence of  epidural or paraspinal hematoma.   06/26/13 MRI LUMBAR - focal central disc protrusion at L2-3 and mild facet and disc degenerative changes throughout but without significant compression.   ASSESSMENT AND PLAN  58 y.o. year old female here initially with diffuse pain, mainly low back and legs, and secondary weakness. Today mainly with right foot/ankle pain. Also with numerous office and ER visists for falls, pain, weakness, chest pain. Diagnostic testing unremarkable. Psychosocial stressors are high and uncontrolled.  Dx: pain syndrome, conversion reaction, fibromyalgia  PLAN: 1. I recommend psychiatry, physical therapy and pain clinic evaluation; patient will think about it, but declines at this time 2. Consider cymbalta (to help with depression and pain; patient will  discuss with PCP)  Return for return to PCP.  Penni Bombard, MD 12/20/8590, 9:24 AM Certified in Neurology, Neurophysiology and Neuroimaging  [facility] [address] [place], [place] [address] [phone]

## 2013-09-21 NOTE — Patient Instructions (Signed)
I recommend: - referral to physical therapy for balance improvement - referral to psychiatry for anxiety/depression evaluation - referral to pain clinic   If you agree with plan, then ask your primary care doctor to make the referrals.

## 2013-10-30 ENCOUNTER — Emergency Department (HOSPITAL_COMMUNITY)
Admission: EM | Admit: 2013-10-30 | Discharge: 2013-10-30 | Disposition: A | Discharge status: Home / Self Care | Payer: PRIVATE HEALTH INSURANCE | Attending: Emergency Medicine | Admitting: Emergency Medicine

## 2013-10-30 ENCOUNTER — Emergency Department (INDEPENDENT_AMBULATORY_CARE_PROVIDER_SITE_OTHER)
Admission: EM | Admit: 2013-10-30 | Discharge: 2013-10-30 | Disposition: A | Discharge status: Home / Self Care | Payer: PRIVATE HEALTH INSURANCE | Source: Home / Self Care | Attending: Emergency Medicine | Admitting: Emergency Medicine

## 2013-10-30 ENCOUNTER — Encounter (HOSPITAL_COMMUNITY): Payer: Self-pay | Admitting: Emergency Medicine

## 2013-10-30 DIAGNOSIS — Z91040 Latex allergy status: Secondary | ICD-10-CM | POA: Insufficient documentation

## 2013-10-30 DIAGNOSIS — Z8781 Personal history of (healed) traumatic fracture: Secondary | ICD-10-CM | POA: Insufficient documentation

## 2013-10-30 DIAGNOSIS — Z792 Long term (current) use of antibiotics: Secondary | ICD-10-CM | POA: Insufficient documentation

## 2013-10-30 DIAGNOSIS — J4489 Other specified chronic obstructive pulmonary disease: Secondary | ICD-10-CM | POA: Insufficient documentation

## 2013-10-30 DIAGNOSIS — I252 Old myocardial infarction: Secondary | ICD-10-CM | POA: Insufficient documentation

## 2013-10-30 DIAGNOSIS — F17200 Nicotine dependence, unspecified, uncomplicated: Secondary | ICD-10-CM | POA: Insufficient documentation

## 2013-10-30 DIAGNOSIS — Z7982 Long term (current) use of aspirin: Secondary | ICD-10-CM | POA: Insufficient documentation

## 2013-10-30 DIAGNOSIS — F329 Major depressive disorder, single episode, unspecified: Secondary | ICD-10-CM | POA: Insufficient documentation

## 2013-10-30 DIAGNOSIS — F411 Generalized anxiety disorder: Secondary | ICD-10-CM | POA: Insufficient documentation

## 2013-10-30 DIAGNOSIS — F3289 Other specified depressive episodes: Secondary | ICD-10-CM | POA: Insufficient documentation

## 2013-10-30 DIAGNOSIS — Z9181 History of falling: Secondary | ICD-10-CM | POA: Insufficient documentation

## 2013-10-30 DIAGNOSIS — Z8719 Personal history of other diseases of the digestive system: Secondary | ICD-10-CM | POA: Insufficient documentation

## 2013-10-30 DIAGNOSIS — Z872 Personal history of diseases of the skin and subcutaneous tissue: Secondary | ICD-10-CM | POA: Insufficient documentation

## 2013-10-30 DIAGNOSIS — L0201 Cutaneous abscess of face: Secondary | ICD-10-CM

## 2013-10-30 DIAGNOSIS — L03211 Cellulitis of face: Secondary | ICD-10-CM | POA: Insufficient documentation

## 2013-10-30 DIAGNOSIS — Z79899 Other long term (current) drug therapy: Secondary | ICD-10-CM | POA: Insufficient documentation

## 2013-10-30 DIAGNOSIS — J449 Chronic obstructive pulmonary disease, unspecified: Secondary | ICD-10-CM | POA: Insufficient documentation

## 2013-10-30 MED ORDER — TRAMADOL HCL 50 MG PO TABS
50.0000 mg | ORAL_TABLET | Freq: Once | ORAL | Status: AC
  Administered 2013-10-30: 50 mg via ORAL
  Filled 2013-10-30: qty 1

## 2013-10-30 MED ORDER — TRAMADOL HCL 50 MG PO TABS
50.0000 mg | ORAL_TABLET | Freq: Once | ORAL | Status: DC
Start: 2013-10-30 — End: 2013-10-31
  Filled 2013-10-30: qty 1

## 2013-10-30 MED ORDER — CLINDAMYCIN HCL 150 MG PO CAPS: 300.0000 mg | ORAL_CAPSULE | Freq: Three times a day (TID) | ORAL | Status: DC

## 2013-10-30 MED ORDER — TRAMADOL HCL 50 MG PO TABS: 50.0000 mg | ORAL_TABLET | Freq: Four times a day (QID) | ORAL | Status: DC | PRN

## 2013-10-30 NOTE — Discharge Instructions (Signed)
We have determined that your problem requires further evaluation in the emergency department.  We will take care of your transport there.  Once at the emergency department, you will be evaluated by a provider and they will order whatever treatment or tests they deem necessary.  We cannot guarantee that they will do any specific test or do any specific treatment.  ° °

## 2013-10-30 NOTE — ED Notes (Signed)
Right sided facial swelling.  Woke this a.m with pain and redness under right eye.  Denies drainage.  Pt states she used ice pack with no relief.

## 2013-10-30 NOTE — ED Notes (Signed)
Rt facial pain and redness  Since this am.  No previous history.  She has been spring cleaning for the past few days and perhaps had a cleaner splashed onto her face

## 2013-10-30 NOTE — ED Provider Notes (Signed)
  Chief Complaint   Chief Complaint  Patient presents with  . Facial Swelling    History of Present Illness   Lori [name] is a 58 year old female with COPD who has had swelling of the right side of the face since this morning. She denies any injury to the face. She had no recent insect or spider bite, however this did come on after she cleaned out some cabinets. She's felt subjective fever and chills. She denies any headache, blurry vision, or double vision. She has no pain with eye movement. She denies any nasal congestion, rhinorrhea, or purulent drainage. She has no sore throat or adenopathy. She denies any neurological symptoms.  Review of Systems   Other than as noted above, the patient denies any of the following symptoms: Systemic:  No fevers, chills, or sweats. Eye:  No redness, pain, discharge, itching, blurred vision, or diplopia. ENT:  No headache, nasal congestion, sneezing, itching, epistaxis, ear pain, congestion, decreased hearing, ringing in ears, vertigo, or tinnitus.  No oral lesions, sore throat, pain on swallowing, or hoarseness. Neck:  No mass, tenderness or adenopathy. Lungs:  No coughing, wheezing, or shortness of breath. Skin:  No rash or itching.  Blue Eye   Past medical history, family history, social history, meds, and allergies were reviewed. She is allergic to codeine. She has emphysema, asthma, and coronary artery disease. She takes nitroglycerin, aspirin, Valtrex, Singulair, and albuterol.  Physical Examination     Vital signs:  BP 125/65  Pulse 71  Temp(Src) 98.8 F (37.1 C) (Oral)  Resp 18  SpO2 100% General:  Alert and oriented.  In no distress.  Skin warm and dry. Eye:  PERRL, full EOMs, lids and conjunctiva normal.   ENT:  TMs and canals clear.  Nasal mucosa not congested and without drainage.  Mucous membranes moist, no oral lesions, normal dentition, pharynx clear.  She has redness, swelling, tenderness, and pain to palpation extending from  the right lower eyelid to the right corner of the mouth involving the entire right side of the face. There is some subcutaneous nodularity. There no blisters or vesicles. She has no obvious bite in the area. Neck:  Supple, full ROM.  No adenopathy, tenderness or mass.  Thyroid normal. Lungs:  Breath sounds clear and equal bilaterally.  No wheezes, rales or rhonchi. Heart:  Rhythm regular, without extrasystoles.  No gallops or murmers. Skin:  Clear, warm and dry.   Assessment   The encounter diagnosis was Facial cellulitis.  Rule out orbital cellulitis. I think she will need a CT scan and probably a dose of IV antibiotics.  Plan    The patient was transferred to the ED via shuttle in stable condition.  Medical Decision Making   58 year old female with COPD presents with 1 day history of right facial swelling and pain centered around right eye.  On exam the entire right maxillary area is swollen red, and tender.  The eye itself is normal.  Patient feels chilled and temp is 98.8.  She has a full range of EOMs.  We will transfer via shuttle.     Harden Mo, MD 10/30/13 2001

## 2013-10-30 NOTE — Discharge Instructions (Signed)
Please follow up with your primary care physician in 1-2 days. If you do not have one please call the [facility] number listed above. Please take your antibiotic until completion. Please take pain medication as prescribed and as needed for pain. Please do not drive on narcotic pain medication. Please read all discharge instructions and return precautions.    Cellulitis Cellulitis is an infection of the skin and the tissue under the skin. The infected area is usually red and tender. This happens most often in the arms and lower legs. HOME CARE   Take your antibiotic medicine as told. Finish the medicine even if you start to feel better.  Keep the infected arm or leg raised (elevated).  Put a warm cloth on the area up to 4 times per day.  Only take medicines as told by your doctor.  Keep all doctor visits as told. GET HELP RIGHT AWAY IF:   You have a fever.  You feel very sleepy.  You throw up (vomit) or have watery poop (diarrhea).  You feel sick and have muscle aches and pains.  You see red streaks on the skin coming from the infected area.  Your red area gets bigger or turns a dark color.  Your bone or joint under the infected area is painful after the skin heals.  Your infection comes back in the same area or different area.  You have a puffy (swollen) bump in the infected area.  You have new symptoms. MAKE SURE YOU:   Understand these instructions.  Will watch your condition.  Will get help right away if you are not doing well or get worse. Document Released: 02/19/2008 Document Revised: 03/03/2012 Document Reviewed: 11/18/2011 [facility] Patient Information 2014 [place], [place].

## 2013-10-30 NOTE — ED Provider Notes (Signed)
CSN: [id number]     Arrival date & time 10/30/13  1937 History   First MD Initiated Contact with Patient 10/30/13 2005     This chart was scribed for non-physician practitioner, Baron Sane PA-C working with Blanchie Dessert, MD by Forrestine Him, ED Scribe. This patient was seen in room TR07C/TR07C and the patient's care was started at 8:20 PM.   Chief Complaint  Patient presents with  . Facial Pain   The history is provided by the patient and a relative. No language interpreter was used.    HPI Comments: Lori [name] is a 58 y.o. female with multiple medical problems including COPD, asthma, history of MI, anxiety, and depression who presents to the Emergency Department complaining of gradually worsening, moderate swelling below the right eye with associated erythema and pain described as "burning" and "itching" rated 10/10 that initially started this morning. She denies any recent injury or trauma. Denies any drainage to the area at this time. Pt denies a previous history of similar pain. She reports recently doing some spring cleaning for the last few days, and is unsure if she may have obtained droplet exposure from chemical cleanring supplies to her face. She has tried ice compresses with no noticeable improvement, but denies trying anything OTC for her discomfort. At this time she denies any visual disturbances, drainage from the eyes bilaterally, fever, chills, rhinorrhea, sore throat, or dysphagia. She denies a PMHx of diabetes.  Past Medical History  Diagnosis Date  . COPD (chronic obstructive pulmonary disease) with chronic bronchitis     & Emphysema  . Acute MI     x3  . Asthma   . Barrett esophagus 2007  . Hiatal hernia [id number]  . Duodenitis without mention of hemorrhage 2007  . Esophageal stricture   . Esophageal reflux 2007  . Anxiety   . Depression   . Multiple fractures     from falls, fx rt. elbow, fx left wrist, bilateral ankles  . Allergy   . Ulcer   .  H/O hiatal hernia    Past Surgical History  Procedure Laterality Date  . Abdominal hysterectomy    . Neck surgery    . Cholecystectomy    . Elbow fracture surgery      right  . Wrist fracture surgery      left, has plate  . Colonoscopy    . Nm myoview ltd  07/02/2013    Normal LV function, EF 83%. (Likely overestimated due to low volume);; no evidence of ischemia or infarction. Normal wall motion.  . Lower extremity arterial dopplers  07/06/2013    RABI 1.0, LABI 1.1.; No evidence of significant vascular atherogenic plaque   Family History  Problem Relation Age of Onset  . Emphysema Father   . Colon cancer Neg Hx   . Diabetes Maternal Grandmother   . Diabetes Maternal Aunt   . Diabetes      mat. cousin  . Heart disease Brother   . Dementia Mother    History  Substance Use Topics  . Smoking status: Current Every Day Smoker -- 0.50 packs/day for 21 years    Types: Cigarettes  . Smokeless tobacco: Never Used     Comment: tobaccco info given 03/06/12  . Alcohol Use: No   OB History   Grav Para Term Preterm Abortions TAB SAB Ect Mult Living                 Review of Systems  Constitutional: Negative for  fever and chills.  HENT: Negative for rhinorrhea, sneezing and sore throat.        Swelling and erythema below right eye  Eyes: Negative for visual disturbance.  All other systems reviewed and are negative.   Allergies  Bee venom; Codeine; Propoxyphene n-acetaminophen; Toradol; Hydrocodone-acetaminophen; and Latex  Home Medications   Current Outpatient Rx  Name  Route  Sig  Dispense  Refill  . aspirin 81 MG tablet   Oral   Take 81 mg by mouth daily.         . montelukast (SINGULAIR) 10 MG tablet   Oral   Take 10 mg by mouth at bedtime.         . nitroGLYCERIN (NITROSTAT) 0.4 MG SL tablet   Sublingual   Place 0.4 mg under the tongue every 5 (five) minutes as needed for chest pain.         . valACYclovir (VALTREX) 500 MG tablet   Oral   Take 500 mg  by mouth 2 (two) times daily.         Marland Kitchen ALPRAZolam (XANAX) 1 MG tablet   Oral   Take 10 mg by mouth as needed.         . sulfamethoxazole-trimethoprim (BACTRIM,SEPTRA) 200-40 MG/5ML suspension      2 (two) times daily.           Triage Vitals: BP 113/63  Pulse 75  Temp(Src) 97.8 F (36.6 C) (Oral)  Resp 16  Ht 5\' 4"  (1.626 m)  Wt 123 lb 6 oz (55.963 kg)  BMI 21.17 kg/m2  SpO2 95%  Physical Exam  Constitutional: She is oriented to person, place, and time. She appears well-developed and well-nourished. No distress.  HENT:  Head: Normocephalic and atraumatic.    Right Ear: External ear normal.  Left Ear: External ear normal.  Nose: Nose normal.  Mouth/Throat: Oropharynx is clear and moist.  Area of warmth, erythema noted. TTP. No drainage. No fluctuance.   Eyes: Conjunctivae, EOM and lids are normal. Pupils are equal, round, and reactive to light. Lids are everted and swept, no foreign bodies found. Right eye exhibits no chemosis, no discharge, no exudate and no hordeolum. No foreign body present in the right eye. Left eye exhibits no chemosis, no discharge, no exudate and no hordeolum. No foreign body present in the left eye. No scleral icterus.  Neck: Normal range of motion. Neck supple.  Cardiovascular: Normal rate.   Pulmonary/Chest: Effort normal.  Abdominal: Soft.  Musculoskeletal: Normal range of motion.  Neurological: She is alert and oriented to person, place, and time.  Skin: Skin is warm and dry. She is not diaphoretic.  Psychiatric: She has a normal mood and affect.   ED Course  Procedures (including critical care time) Medications  traMADol (ULTRAM) tablet 50 mg (50 mg Oral Given 10/30/13 2052)     DIAGNOSTIC STUDIES: Oxygen Saturation is 95% on RA, adequate by my interpretation.    COORDINATION OF CARE: 8:30 PM- Will give Tramadol. Discussed treatment plan with pt at bedside and pt agreed to plan.     Labs Review Labs Reviewed - No data to  display Imaging Review No results found.  EKG Interpretation   None       MDM   Final diagnoses:  Facial cellulitis    Filed Vitals:   10/30/13 2057  BP: 110/65  Pulse: 69  Temp:   Resp: 20    Afebrile, NAD, non-toxic appearing, AAOx4. Patient presenting with right maxillary erythema  and swelling. Area is warm and TTP. EOMi and without pain. PERRLa. Ocular exam otherwise normal. No evidence of orbital cellulitis. No fluctuance, no appreciable abscess noted. Oropharynx clear, no evidence of periapical abscess. May be early preseptal or periorbital cellulitis. Do not feel imaging is needed at this time. Will prescribed antibiotics and pain medication. Advised PCP f/u this week. Return precautions discussed. Patient is agreeable to plan. Patient is stable at time of discharge. Patient d/w with Dr. Maryan Rued, agrees with plan.       I personally performed the services described in this documentation, which was scribed in my presence. The recorded information has been reviewed and is accurate.    [place], PA-C 10/31/13 0004

## 2013-10-31 NOTE — ED Provider Notes (Signed)
Medical screening examination/treatment/procedure(s) were performed by non-physician practitioner and as supervising physician I was immediately available for consultation/collaboration.  EKG Interpretation   None         Blanchie Dessert, MD 10/31/13 1214

## 2013-12-20 ENCOUNTER — Emergency Department (HOSPITAL_COMMUNITY)
Admission: EM | Admit: 2013-12-20 | Discharge: 2013-12-20 | Disposition: A | Discharge status: Home / Self Care | Payer: PRIVATE HEALTH INSURANCE | Attending: Emergency Medicine | Admitting: Emergency Medicine

## 2013-12-20 ENCOUNTER — Emergency Department (HOSPITAL_COMMUNITY): Payer: PRIVATE HEALTH INSURANCE

## 2013-12-20 DIAGNOSIS — F17200 Nicotine dependence, unspecified, uncomplicated: Secondary | ICD-10-CM | POA: Insufficient documentation

## 2013-12-20 DIAGNOSIS — Z8709 Personal history of other diseases of the respiratory system: Secondary | ICD-10-CM | POA: Insufficient documentation

## 2013-12-20 DIAGNOSIS — F329 Major depressive disorder, single episode, unspecified: Secondary | ICD-10-CM | POA: Insufficient documentation

## 2013-12-20 DIAGNOSIS — F3289 Other specified depressive episodes: Secondary | ICD-10-CM | POA: Insufficient documentation

## 2013-12-20 DIAGNOSIS — R5383 Other fatigue: Secondary | ICD-10-CM

## 2013-12-20 DIAGNOSIS — R5381 Other malaise: Secondary | ICD-10-CM | POA: Insufficient documentation

## 2013-12-20 DIAGNOSIS — Z79899 Other long term (current) drug therapy: Secondary | ICD-10-CM | POA: Insufficient documentation

## 2013-12-20 DIAGNOSIS — K219 Gastro-esophageal reflux disease without esophagitis: Secondary | ICD-10-CM | POA: Insufficient documentation

## 2013-12-20 DIAGNOSIS — J449 Chronic obstructive pulmonary disease, unspecified: Secondary | ICD-10-CM | POA: Insufficient documentation

## 2013-12-20 DIAGNOSIS — J4489 Other specified chronic obstructive pulmonary disease: Secondary | ICD-10-CM | POA: Insufficient documentation

## 2013-12-20 DIAGNOSIS — IMO0002 Reserved for concepts with insufficient information to code with codable children: Secondary | ICD-10-CM | POA: Insufficient documentation

## 2013-12-20 DIAGNOSIS — R197 Diarrhea, unspecified: Secondary | ICD-10-CM | POA: Insufficient documentation

## 2013-12-20 DIAGNOSIS — R112 Nausea with vomiting, unspecified: Secondary | ICD-10-CM | POA: Insufficient documentation

## 2013-12-20 DIAGNOSIS — F411 Generalized anxiety disorder: Secondary | ICD-10-CM | POA: Insufficient documentation

## 2013-12-20 DIAGNOSIS — Z91040 Latex allergy status: Secondary | ICD-10-CM | POA: Insufficient documentation

## 2013-12-20 LAB — BASIC METABOLIC PANEL
BUN: 6 mg/dL (ref 6–23)
CO2: 20 mEq/L (ref 19–32)
CREATININE: 0.91 mg/dL (ref 0.50–1.10)
Calcium: 9.1 mg/dL (ref 8.4–10.5)
Chloride: 106 mEq/L (ref 96–112)
GFR calc Af Amer: 80 mL/min — ABNORMAL LOW (ref >90)
GFR calc non Af Amer: 69 mL/min — ABNORMAL LOW (ref >90)
Glucose, Bld: 101 mg/dL — ABNORMAL HIGH (ref 70–99)
POTASSIUM: 4.4 meq/L (ref 3.7–5.3)
Sodium: 140 mEq/L (ref 137–147)

## 2013-12-20 LAB — CBC WITH DIFFERENTIAL/PLATELET
BASOS ABS: 0 10*3/uL (ref 0.0–0.1)
BASOS PCT: 0 % (ref 0–1)
Eosinophils Absolute: 0 10*3/uL (ref 0.0–0.7)
Eosinophils Relative: 1 % (ref 0–5)
HEMATOCRIT: 36.7 % (ref 36.0–46.0)
HEMOGLOBIN: 12.4 g/dL (ref 12.0–15.0)
Lymphocytes Relative: 30 % (ref 12–46)
Lymphs Abs: 1.2 10*3/uL (ref 0.7–4.0)
MCH: 27.9 pg (ref 26.0–34.0)
MCHC: 33.8 g/dL (ref 30.0–36.0)
MCV: 82.7 fL (ref 78.0–100.0)
MONO ABS: 0.4 10*3/uL (ref 0.1–1.0)
Monocytes Relative: 11 % (ref 3–12)
NEUTROS ABS: 2.4 10*3/uL (ref 1.7–7.7)
NEUTROS PCT: 59 % (ref 43–77)
PLATELETS: 239 10*3/uL (ref 150–400)
RBC: 4.44 MIL/uL (ref 3.87–5.11)
RDW: 14.7 % (ref 11.5–15.5)
WBC: 4 10*3/uL (ref 4.0–10.5)

## 2013-12-20 MED ORDER — PREDNISONE 50 MG PO TABS: 50.0000 mg | ORAL_TABLET | Freq: Every day | ORAL | Status: DC

## 2013-12-20 MED ORDER — ONDANSETRON HCL 4 MG/2ML IJ SOLN
4.0000 mg | Freq: Once | INTRAMUSCULAR | Status: AC
  Administered 2013-12-20: 4 mg via INTRAVENOUS
  Filled 2013-12-20: qty 2

## 2013-12-20 MED ORDER — ACETAMINOPHEN 325 MG PO TABS
650.0000 mg | ORAL_TABLET | Freq: Once | ORAL | Status: AC
  Administered 2013-12-20: 650 mg via ORAL
  Filled 2013-12-20: qty 2

## 2013-12-20 MED ORDER — SODIUM CHLORIDE 0.9 % IV SOLN
1000.0000 mL | INTRAVENOUS | Status: DC
  Administered 2013-12-20: 1000 mL via INTRAVENOUS

## 2013-12-20 MED ORDER — PREDNISONE 20 MG PO TABS
60.0000 mg | ORAL_TABLET | Freq: Once | ORAL | Status: AC
  Administered 2013-12-20: 60 mg via ORAL
  Filled 2013-12-20: qty 3

## 2013-12-20 MED ORDER — SODIUM CHLORIDE 0.9 % IV SOLN
1000.0000 mL | Freq: Once | INTRAVENOUS | Status: AC
  Administered 2013-12-20: 1000 mL via INTRAVENOUS

## 2013-12-20 MED ORDER — BENZONATATE 100 MG PO CAPS: 100.0000 mg | ORAL_CAPSULE | Freq: Three times a day (TID) | ORAL | Status: DC

## 2013-12-20 NOTE — ED Notes (Signed)
Bed: WP80 Expected date:  Expected time:  Means of arrival:  Comments: ems- 58 yo F, weakness and cough

## 2013-12-20 NOTE — ED Provider Notes (Signed)
CSN: [id number]     Arrival date & time 12/20/13  1400 History  First MD Initiated Contact with Patient 12/20/13 1412     Chief Complaint  Patient presents with  . Weakness  . Cough   HPI Patient presents to the emergency room with complaints of cough, vomiting, diarrhea and weakness. Patient states over the last several weeks she has had trouble with intermittent episodes of vomiting and diarrhea. Patient saw her primary doctor initially was told she had a viral illness. Patient states her symptoms have not completely resolved. She has had one episode daily for vomiting and diarrhea.  She has not noticed any blood in her emesis or stool for the last several days she started having a significant cough. It is nonproductive. Patient was walking today when she started to feel weak as if she was going to pass out. She has felt feverish and has had myalgias Past Medical History  Diagnosis Date  . COPD (chronic obstructive pulmonary disease) with chronic bronchitis     & Emphysema  . Acute MI     x3  . Asthma   . Barrett esophagus 2007  . Hiatal hernia [id number]  . Duodenitis without mention of hemorrhage 2007  . Esophageal stricture   . Esophageal reflux 2007  . Anxiety   . Depression   . Multiple fractures     from falls, fx rt. elbow, fx left wrist, bilateral ankles  . Allergy   . Ulcer   . H/O hiatal hernia    Past Surgical History  Procedure Laterality Date  . Abdominal hysterectomy    . Neck surgery    . Cholecystectomy    . Elbow fracture surgery      right  . Wrist fracture surgery      left, has plate  . Colonoscopy    . Nm myoview ltd  07/02/2013    Normal LV function, EF 83%. (Likely overestimated due to low volume);; no evidence of ischemia or infarction. Normal wall motion.  . Lower extremity arterial dopplers  07/06/2013    RABI 1.0, LABI 1.1.; No evidence of significant vascular atherogenic plaque   Family History  Problem Relation Age of Onset  . Emphysema  Father   . Colon cancer Neg Hx   . Diabetes Maternal Grandmother   . Diabetes Maternal Aunt   . Diabetes      mat. cousin  . Heart disease Brother   . Dementia Mother    History  Substance Use Topics  . Smoking status: Current Every Day Smoker -- 0.50 packs/day for 21 years    Types: Cigarettes  . Smokeless tobacco: Never Used     Comment: tobaccco info given 03/06/12  . Alcohol Use: No   OB History   Grav Para Term Preterm Abortions TAB SAB Ect Mult Living                 Review of Systems  All other systems reviewed and are negative.      Allergies  Bee venom; Codeine; Propoxyphene n-acetaminophen; Toradol; Hydrocodone-acetaminophen; and Latex  Home Medications   Current Outpatient Rx  Name  Route  Sig  Dispense  Refill  . dexlansoprazole (DEXILANT) 60 MG capsule   Oral   Take 60 mg by mouth daily.         . mometasone-formoterol (DULERA) 200-5 MCG/ACT AERO   Inhalation   Inhale 2 puffs into the lungs 2 (two) times daily.         Marland Kitchen  montelukast (SINGULAIR) 10 MG tablet   Oral   Take 10 mg by mouth at bedtime.         Marland Kitchen omeprazole (PRILOSEC) 20 MG capsule   Oral   Take 20 mg by mouth daily.         . traMADol (ULTRAM) 50 MG tablet   Oral   Take 1 tablet (50 mg total) by mouth every 6 (six) hours as needed for severe pain.   15 tablet   0   . ALPRAZolam (XANAX) 1 MG tablet   Oral   Take 1 mg by mouth as needed for anxiety (takes only once per day prn).          . nitroGLYCERIN (NITROSTAT) 0.4 MG SL tablet   Sublingual   Place 0.4 mg under the tongue every 5 (five) minutes as needed for chest pain.         Marland Kitchen ondansetron (ZOFRAN) 4 MG tablet   Oral   Take 4 mg by mouth every 6 (six) hours as needed. For nausea          BP 107/64  Pulse 96  Temp(Src) 98.5 F (36.9 C) (Oral)  Resp 16  SpO2 95% Physical Exam  Nursing note and vitals reviewed. Constitutional: No distress.  HENT:  Head: Normocephalic and atraumatic.  Right Ear:  External ear normal.  Left Ear: External ear normal.  Eyes: Conjunctivae are normal. Right eye exhibits no discharge. Left eye exhibits no discharge. No scleral icterus.  Neck: Neck supple. No tracheal deviation present.  Cardiovascular: Normal rate, regular rhythm and intact distal pulses.   Pulmonary/Chest: Effort normal and breath sounds normal. No stridor. No respiratory distress. She has no wheezes. She has no rales.  Abdominal: Soft. Bowel sounds are normal. She exhibits no distension. There is no tenderness. There is no rebound and no guarding.  Musculoskeletal: She exhibits no edema and no tenderness.  Neurological: She is alert. She has normal strength. No cranial nerve deficit (no facial droop, extraocular movements intact, no slurred speech) or sensory deficit. She exhibits normal muscle tone. She displays no seizure activity. Coordination normal.  Skin: Skin is warm and dry. No rash noted. She is not diaphoretic.  Psychiatric: She has a normal mood and affect.    ED Course  Procedures (including critical care time) Labs Review Labs Reviewed  BASIC METABOLIC PANEL - Abnormal; Notable for the following:    Glucose, Bld 101 (*)    GFR calc non Af Amer 69 (*)    GFR calc Af Amer 80 (*)    All other components within normal limits  CBC WITH DIFFERENTIAL   Imaging Review Dg Chest 2 View  12/20/2013   CLINICAL DATA:  Weakness and chills.  EXAM: CHEST - 2 VIEW  COMPARISON:  DG CHEST 2 VIEW dated 08/09/2013; DG CHEST 2 VIEW dated 07/03/2013  FINDINGS: The heart size and mediastinal contours are within normal limits. Stable chronic lung disease. There is no evidence of pulmonary edema, consolidation, pneumothorax, nodule or pleural fluid. The visualized skeletal structures are unremarkable.  IMPRESSION: Stable chronic lung disease.  No active disease.   Electronically Signed   By: Aletta Edouard M.D.   On: 12/20/2013 15:30     EKG Interpretation   Date/Time:  Monday December 20 2013  14:27:25 EDT Ventricular Rate:  91 PR Interval:  108 QRS Duration: 74 QT Interval:  353 QTC Calculation: 434 R Axis:   71 Text Interpretation:  Sinus rhythm Short PR interval  No significant change  since last tracing Confirmed by Kendarius Vigen  MD-J, Garison Genova [id number]) on 12/20/2013  2:48:41 PM     Medications  0.9 %  sodium chloride infusion (0 mLs Intravenous Stopped 12/20/13 1551)    Followed by  0.9 %  sodium chloride infusion (1,000 mLs Intravenous New Bag/Given 12/20/13 1553)  predniSONE (DELTASONE) tablet 60 mg (not administered)  ondansetron (ZOFRAN) injection 4 mg (4 mg Intravenous Given 12/20/13 1506)  acetaminophen (TYLENOL) tablet 650 mg (650 mg Oral Given 12/20/13 1553)     MDM   Final diagnoses:  COPD (chronic obstructive pulmonary disease)    No PNA.  Labs and CXR are reassuring.  NO wheezing in the ED but suspect her symptoms are related to her COPD with her frequent cough.  Will dc home on steroids and tessalon    Kathalene Frames, MD 12/20/13 1606

## 2013-12-20 NOTE — Discharge Instructions (Signed)
Bronchitis °Bronchitis is swelling (inflammation) of the air tubes leading to your lungs (bronchi). This causes mucus and a cough. If the swelling gets bad, you may have trouble breathing. °HOME CARE  °· Rest. °· Drink enough fluids to keep your pee (urine) clear or pale yellow (unless you have a condition where you have to watch how much you drink). °· Only take medicine as told by your doctor. If you were given antibiotic medicines, finish them even if you start to feel better. °· Avoid smoke, irritating chemicals, and strong smells. These make the problem worse. Quit smoking if you smoke. This helps your lungs heal faster. °· Use a cool mist humidifier. Change the water in the humidifier every day. You can also sit in the bathroom with hot shower running for 5 10 minutes. Keep the door closed. °· See your health care provider as told. °· Wash your hands often. °GET HELP IF: °Your problems do not get better after 1 week. °GET HELP RIGHT AWAY IF:  °· Your fever gets worse. °· You have chills. °· Your chest hurts. °· Your problems breathing get worse. °· You have blood in your mucus. °· You pass out (faint). °· You feel lightheaded. °· You have a bad headache. °· You throw up (vomit) again and again. °MAKE SURE YOU: °· Understand these instructions. °· Will watch your condition. °· Will get help right away if you are not doing well or get worse. °Document Released: 02/19/2008 Document Revised: 06/23/2013 Document Reviewed: 04/27/2013 °[employer]® Patient Information ©2014 [employer]. ° °

## 2013-12-20 NOTE — ED Notes (Signed)
Per [facility] pt from restaurant. Pt walked to [employer], there called ems due to weakness and cough. Seen at pcp 3 weeks ago for same complaints. Has not gotten better called 911. Pt reports fever and body aches.

## 2013-12-21 ENCOUNTER — Encounter (HOSPITAL_COMMUNITY): Payer: Self-pay | Admitting: Emergency Medicine

## 2013-12-21 LAB — INFLUENZA PANEL BY PCR (TYPE A & B)
H1N1 flu by pcr: NOT DETECTED
Influenza A By PCR: NEGATIVE
Influenza B By PCR: POSITIVE — AB

## 2014-01-15 ENCOUNTER — Telehealth: Payer: Self-pay | Admitting: Surgery

## 2014-01-15 NOTE — Telephone Encounter (Signed)
Message copied by Laurena Slimmer on Sat Jan 15, 2014  2:15 PM ------      Message from: Dorie Rank      Created: Mon Dec 20, 2013  4:03 PM      Regarding: Order for KASEE, HANTZ             Patient Name: Lori [name], Lori [name]([id number])      Sex: Female      DOB: 1955-12-29              PCP: NO PCP PER PATIENT        Center: [facility]             Types of orders made on 12/20/2013: Consult, ECG, Imaging, Lab, Medications,                                           Nursing            Order Date:12/20/2013      Ordering User:KNAPP, [place]      Attending Provider:Jon Narda Rutherford, MD [phone]      Authorizing Provider: Kathalene Frames, MD [[id number]]      Department:[facility][[id number]]            Order Specific Information      Order: COPD clinic follow up [Custom: [id number]]  Order #: [id number]  Qty: 1        Priority: Routine  Class: Hospital Performed          Where does the patient receive follow up COPD care -> Other                 Follow up in -> 5-7 days               Released on: 12/20/2013  4:03 PM                    Priority: Routine  Class: Hospital Performed          Where does the patient receive follow up COPD care -> Other                 Follow up in -> 5-7 days               Released on: 12/20/2013  4:03 PM       ------

## 2014-01-15 NOTE — Telephone Encounter (Signed)
ED CM contacted patient concerning f/u for COPD ED visit 12/21/13. Pt reports she f/u up with PCP Dr. York Ram but she does not have transportation at this time she needs to what on a ride to come in for PFT. Pt states, she is feeling better and taking medication as prescribed. Reviewed S&S of when to call for emergency services.  Pt verbalized understanding teach back done. Encouraged patient to schedule for next f/u within 3 months. Pt states, that she will call in 2 weeks.

## 2014-01-16 ENCOUNTER — Emergency Department (HOSPITAL_COMMUNITY): Payer: PRIVATE HEALTH INSURANCE

## 2014-01-16 ENCOUNTER — Emergency Department (HOSPITAL_COMMUNITY)
Admission: EM | Admit: 2014-01-16 | Discharge: 2014-01-17 | Disposition: A | Discharge status: Home / Self Care | Payer: PRIVATE HEALTH INSURANCE | Attending: Emergency Medicine | Admitting: Emergency Medicine

## 2014-01-16 ENCOUNTER — Encounter (HOSPITAL_COMMUNITY): Payer: Self-pay | Admitting: Emergency Medicine

## 2014-01-16 DIAGNOSIS — Z79899 Other long term (current) drug therapy: Secondary | ICD-10-CM | POA: Diagnosis not present

## 2014-01-16 DIAGNOSIS — J438 Other emphysema: Secondary | ICD-10-CM | POA: Diagnosis not present

## 2014-01-16 DIAGNOSIS — F3289 Other specified depressive episodes: Secondary | ICD-10-CM | POA: Diagnosis not present

## 2014-01-16 DIAGNOSIS — I252 Old myocardial infarction: Secondary | ICD-10-CM | POA: Diagnosis not present

## 2014-01-16 DIAGNOSIS — F17200 Nicotine dependence, unspecified, uncomplicated: Secondary | ICD-10-CM | POA: Insufficient documentation

## 2014-01-16 DIAGNOSIS — J45901 Unspecified asthma with (acute) exacerbation: Principal | ICD-10-CM

## 2014-01-16 DIAGNOSIS — Z8781 Personal history of (healed) traumatic fracture: Secondary | ICD-10-CM | POA: Diagnosis not present

## 2014-01-16 DIAGNOSIS — K219 Gastro-esophageal reflux disease without esophagitis: Secondary | ICD-10-CM | POA: Insufficient documentation

## 2014-01-16 DIAGNOSIS — Z23 Encounter for immunization: Secondary | ICD-10-CM | POA: Diagnosis not present

## 2014-01-16 DIAGNOSIS — R300 Dysuria: Secondary | ICD-10-CM | POA: Insufficient documentation

## 2014-01-16 DIAGNOSIS — J441 Chronic obstructive pulmonary disease with (acute) exacerbation: Secondary | ICD-10-CM

## 2014-01-16 DIAGNOSIS — F411 Generalized anxiety disorder: Secondary | ICD-10-CM | POA: Diagnosis not present

## 2014-01-16 DIAGNOSIS — R079 Chest pain, unspecified: Secondary | ICD-10-CM | POA: Diagnosis not present

## 2014-01-16 DIAGNOSIS — R509 Fever, unspecified: Secondary | ICD-10-CM | POA: Insufficient documentation

## 2014-01-16 DIAGNOSIS — R05 Cough: Secondary | ICD-10-CM | POA: Diagnosis present

## 2014-01-16 DIAGNOSIS — F329 Major depressive disorder, single episode, unspecified: Secondary | ICD-10-CM | POA: Insufficient documentation

## 2014-01-16 DIAGNOSIS — Z872 Personal history of diseases of the skin and subcutaneous tissue: Secondary | ICD-10-CM | POA: Diagnosis not present

## 2014-01-16 DIAGNOSIS — R197 Diarrhea, unspecified: Secondary | ICD-10-CM | POA: Insufficient documentation

## 2014-01-16 DIAGNOSIS — Z91040 Latex allergy status: Secondary | ICD-10-CM | POA: Insufficient documentation

## 2014-01-16 DIAGNOSIS — R059 Cough, unspecified: Secondary | ICD-10-CM | POA: Diagnosis present

## 2014-01-16 LAB — URINALYSIS, ROUTINE W REFLEX MICROSCOPIC
BILIRUBIN URINE: NEGATIVE
Glucose, UA: NEGATIVE mg/dL
Hgb urine dipstick: NEGATIVE
Ketones, ur: NEGATIVE mg/dL
NITRITE: NEGATIVE
PH: 6.5 (ref 5.0–8.0)
Protein, ur: NEGATIVE mg/dL
Specific Gravity, Urine: 1.012 (ref 1.005–1.030)
UROBILINOGEN UA: 0.2 mg/dL (ref 0.0–1.0)

## 2014-01-16 LAB — URINE MICROSCOPIC-ADD ON

## 2014-01-16 LAB — I-STAT TROPONIN, ED: Troponin i, poc: 0 ng/mL (ref 0.00–0.08)

## 2014-01-16 LAB — BASIC METABOLIC PANEL
BUN: 13 mg/dL (ref 6–23)
CHLORIDE: 100 meq/L (ref 96–112)
CO2: 23 mEq/L (ref 19–32)
Calcium: 8.5 mg/dL (ref 8.4–10.5)
Creatinine, Ser: 0.87 mg/dL (ref 0.50–1.10)
GFR, EST AFRICAN AMERICAN: 84 mL/min — AB (ref >90)
GFR, EST NON AFRICAN AMERICAN: 73 mL/min — AB (ref >90)
Glucose, Bld: 98 mg/dL (ref 70–99)
POTASSIUM: 4.4 meq/L (ref 3.7–5.3)
Sodium: 137 mEq/L (ref 137–147)

## 2014-01-16 LAB — CBC
HCT: 33.4 % — ABNORMAL LOW (ref 36.0–46.0)
HEMOGLOBIN: 11.1 g/dL — AB (ref 12.0–15.0)
MCH: 27.3 pg (ref 26.0–34.0)
MCHC: 33.2 g/dL (ref 30.0–36.0)
MCV: 82.1 fL (ref 78.0–100.0)
PLATELETS: 303 10*3/uL (ref 150–400)
RBC: 4.07 MIL/uL (ref 3.87–5.11)
RDW: 15 % (ref 11.5–15.5)
WBC: 15.4 10*3/uL — ABNORMAL HIGH (ref 4.0–10.5)

## 2014-01-16 LAB — PRO B NATRIURETIC PEPTIDE: Pro B Natriuretic peptide (BNP): 276.5 pg/mL — ABNORMAL HIGH (ref 0–125)

## 2014-01-16 MED ORDER — PREDNISONE (PAK) 10 MG PO TABS: ORAL_TABLET | ORAL | Status: DC

## 2014-01-16 MED ORDER — PREDNISONE 20 MG PO TABS
60.0000 mg | ORAL_TABLET | Freq: Once | ORAL | Status: AC
  Administered 2014-01-16: 60 mg via ORAL
  Filled 2014-01-16: qty 3

## 2014-01-16 MED ORDER — IPRATROPIUM BROMIDE 0.02 % IN SOLN
0.5000 mg | Freq: Once | RESPIRATORY_TRACT | Status: AC
  Administered 2014-01-16: 0.5 mg via RESPIRATORY_TRACT
  Filled 2014-01-16: qty 2.5

## 2014-01-16 MED ORDER — ALBUTEROL SULFATE (2.5 MG/3ML) 0.083% IN NEBU
5.0000 mg | INHALATION_SOLUTION | Freq: Once | RESPIRATORY_TRACT | Status: AC
  Administered 2014-01-16: 5 mg via RESPIRATORY_TRACT
  Filled 2014-01-16: qty 6

## 2014-01-16 NOTE — ED Provider Notes (Signed)
CSN: [id number]     Arrival date & time 01/16/14  2146 History   First MD Initiated Contact with Patient 01/16/14 2201     Chief Complaint  Patient presents with  . Chest Pain  . Cough    Patient is a 58 y.o. female presenting with cough. The history is provided by the patient.  Cough Cough characteristics:  Productive Sputum characteristics:  White Severity:  Moderate Onset quality:  Gradual Duration:  13 weeks Timing:  Constant Progression:  Worsening Relieved by:  Nothing Associated symptoms: chest pain, chills and fever (Pt had a fever of 103 last night)   Associated symptoms: no shortness of breath     Past Medical History  Diagnosis Date  . COPD (chronic obstructive pulmonary disease) with chronic bronchitis     & Emphysema  . Acute MI     x3  . Asthma   . Barrett esophagus 2007  . Hiatal hernia [id number]  . Duodenitis without mention of hemorrhage 2007  . Esophageal stricture   . Esophageal reflux 2007  . Anxiety   . Depression   . Multiple fractures     from falls, fx rt. elbow, fx left wrist, bilateral ankles  . Allergy   . Ulcer   . H/O hiatal hernia    Past Surgical History  Procedure Laterality Date  . Abdominal hysterectomy    . Neck surgery    . Cholecystectomy    . Elbow fracture surgery      right  . Wrist fracture surgery      left, has plate  . Colonoscopy    . Nm myoview ltd  07/02/2013    Normal LV function, EF 83%. (Likely overestimated due to low volume);; no evidence of ischemia or infarction. Normal wall motion.  . Lower extremity arterial dopplers  07/06/2013    RABI 1.0, LABI 1.1.; No evidence of significant vascular atherogenic plaque   Family History  Problem Relation Age of Onset  . Emphysema Father   . Colon cancer Neg Hx   . Diabetes Maternal Grandmother   . Diabetes Maternal Aunt   . Diabetes      mat. cousin  . Heart disease Brother   . Dementia Mother    History  Substance Use Topics  . Smoking status: Current  Every Day Smoker -- 0.50 packs/day for 21 years    Types: Cigarettes  . Smokeless tobacco: Never Used     Comment: tobaccco info given 03/06/12  . Alcohol Use: No   OB History   Grav Para Term Preterm Abortions TAB SAB Ect Mult Living                 Review of Systems  Constitutional: Positive for fever (Pt had a fever of 103 last night) and chills.  Respiratory: Positive for cough. Negative for shortness of breath.   Cardiovascular: Positive for chest pain.  Gastrointestinal: Positive for diarrhea.  Genitourinary: Positive for dysuria.      Allergies  Bee venom; Codeine; Propoxyphene n-acetaminophen; Toradol; Hydrocodone-acetaminophen; and Latex  Home Medications   Prior to Admission medications   Medication Sig Start Date End Date Taking? Authorizing Provider  ALPRAZolam Duanne Moron) 1 MG tablet Take 1 mg by mouth as needed for anxiety (takes only once per day prn).  09/05/13  Yes Historical Provider, MD  dexlansoprazole (DEXILANT) 60 MG capsule Take 60 mg by mouth daily.   Yes Historical Provider, MD  mometasone-formoterol (DULERA) 200-5 MCG/ACT AERO Inhale 2 puffs into the  lungs 2 (two) times daily.   Yes Historical Provider, MD  montelukast (SINGULAIR) 10 MG tablet Take 10 mg by mouth at bedtime.   Yes Historical Provider, MD  nitroGLYCERIN (NITROSTAT) 0.4 MG SL tablet Place 0.4 mg under the tongue every 5 (five) minutes as needed for chest pain.   Yes Historical Provider, MD  omeprazole (PRILOSEC) 20 MG capsule Take 20 mg by mouth daily. 10/28/13  Yes Historical Provider, MD  ondansetron (ZOFRAN) 4 MG tablet Take 4 mg by mouth every 6 (six) hours as needed. For nausea 10/18/13  Yes Historical Provider, MD  promethazine-dextromethorphan (PROMETHAZINE-DM) 6.25-15 MG/5ML syrup Take 5 mLs by mouth 4 (four) times daily as needed for cough.   Yes Historical Provider, MD   BP 95/48  Pulse 98  Temp(Src) 99.3 F (37.4 C) (Oral)  Resp 22  SpO2 97% Physical Exam  Nursing note and vitals  reviewed. Constitutional: She appears well-developed and well-nourished. No distress.  HENT:  Head: Normocephalic and atraumatic.  Right Ear: External ear normal.  Left Ear: External ear normal.  Eyes: Conjunctivae are normal. Right eye exhibits no discharge. Left eye exhibits no discharge. No scleral icterus.  Neck: Neck supple. No tracheal deviation present.  Cardiovascular: Normal rate, regular rhythm and intact distal pulses.   Pulmonary/Chest: Effort normal. No stridor. No respiratory distress. She has wheezes. She has rhonchi. She has no rales.  occsnl end expiratory wheeze, frequent cough   Abdominal: Soft. Bowel sounds are normal. She exhibits no distension. There is no tenderness. There is no rebound and no guarding.  Musculoskeletal: She exhibits no edema and no tenderness.  Neurological: She is alert. She has normal strength. No cranial nerve deficit (no facial droop, extraocular movements intact, no slurred speech) or sensory deficit. She exhibits normal muscle tone. She displays no seizure activity. Coordination normal.  Skin: Skin is warm and dry. No rash noted.  Psychiatric: She has a normal mood and affect.    ED Course  Procedures (including critical care time) Labs Review Labs Reviewed  CBC - Abnormal; Notable for the following:    WBC 15.4 (*)    Hemoglobin 11.1 (*)    HCT 33.4 (*)    All other components within normal limits  BASIC METABOLIC PANEL - Abnormal; Notable for the following:    GFR calc non Af Amer 73 (*)    GFR calc Af Amer 84 (*)    All other components within normal limits  URINALYSIS, ROUTINE W REFLEX MICROSCOPIC - Abnormal; Notable for the following:    Leukocytes, UA SMALL (*)    All other components within normal limits  PRO B NATRIURETIC PEPTIDE - Abnormal; Notable for the following:    Pro B Natriuretic peptide (BNP) 276.5 (*)    All other components within normal limits  URINE MICROSCOPIC-ADD ON - Abnormal; Notable for the following:     Squamous Epithelial / LPF FEW (*)    All other components within normal limits  I-STAT TROPOININ, ED  I-STAT TROPOININ, ED    Imaging Review Dg Chest 2 View  01/16/2014   CLINICAL DATA:  Chest pain, cough  EXAM: CHEST  2 VIEW  COMPARISON:  DG CHEST 2 VIEW dated 12/20/2013; DG CHEST 2 VIEW dated 08/09/2013; DG CHEST 2 VIEW dated 07/03/2013; DG CHEST 2 VIEW dated 09/05/2012; DG RIBS UNILATERAL W/CHEST*L* dated 05/17/2012  FINDINGS: There are bilateral mild chronic interstitial changes. There is no focal parenchymal opacity, pleural effusion, or pneumothorax. The heart and mediastinal contours are unremarkable.  The osseous structures are unremarkable.  IMPRESSION: No active cardiopulmonary disease.   Electronically Signed   By: Kathreen Devoid   On: 01/16/2014 23:25     EKG Interpretation   Date/Time:  Sunday Jan 16 2014 21:56:10 EDT Ventricular Rate:  101 PR Interval:  94 QRS Duration: 72 QT Interval:  336 QTC Calculation: 435 R Axis:   63 Text Interpretation:  Sinus tachycardia Low voltage, precordial leads  Since last tracing rate faster Confirmed by Shaday Rayborn  MD-J, Taiya Nutting ([id number]) on  01/16/2014 10:20:16 PM     Medications  albuterol (PROVENTIL) (2.5 MG/3ML) 0.083% nebulizer solution 5 mg (5 mg Nebulization Given 01/16/14 2241)  ipratropium (ATROVENT) nebulizer solution 0.5 mg (0.5 mg Nebulization Given 01/16/14 2241)  predniSONE (DELTASONE) tablet 60 mg (60 mg Oral Given 01/16/14 2240)    MDM   Final diagnoses:  COPD exacerbation    2347  Pt is feeling better after breathing treatment and steroids.  No pna on xray.  Consistent with recurrent copd exacerbation.  Pt felt better last month when she was given a steroid course.  Will dc home on po steroids.  Once again encouraged smoking cessation.   Kathalene Frames, MD 01/16/14 [phone]

## 2014-01-16 NOTE — ED Notes (Addendum)
Pt states she has emphysema and bronchitis and has been feeling short of breath and had chest pain for the past 3 days. Pt states she took nitroglycerin for the pain at 12PM, which provided some relief. Pt is concerned she may have pneumonia.

## 2014-01-16 NOTE — Discharge Instructions (Signed)
Chronic Obstructive Pulmonary Disease  Chronic obstructive pulmonary disease (COPD) is a common lung condition in which airflow from the lungs is limited. COPD is a general term that can be used to describe many different lung problems that limit airflow, including both chronic bronchitis and emphysema.  If you have COPD, your lung function will probably never return to normal, but there are measures you can take to improve lung function and make yourself feel better.   CAUSES   · Smoking (common).    · Exposure to secondhand smoke.    · Genetic problems.  · Chronic inflammatory lung diseases or recurrent infections.  SYMPTOMS   · Shortness of breath, especially with physical activity.    · Deep, persistent (chronic) cough with a large amount of thick mucus.    · Wheezing.    · Rapid breaths (tachypnea).    · Gray or bluish discoloration (cyanosis) of the skin, especially in fingers, toes, or lips.    · Fatigue.    · Weight loss.    · Frequent infections or episodes when breathing symptoms become much worse (exacerbations).    · Chest tightness.  DIAGNOSIS   Your healthcare provider will take a medical history and perform a physical examination to make the initial diagnosis.  Additional tests for COPD may include:   · Lung (pulmonary) function tests.  · Chest X-ray.  · CT scan.  · Blood tests.  TREATMENT   Treatment available to help you feel better when you have COPD include:   · Inhaler and nebulizer medicines. These help manage the symptoms of COPD and make your breathing more comfortable  · Supplemental oxygen. Supplemental oxygen is only helpful if you have a low oxygen level in your blood.    · Exercise and physical activity. These are beneficial for nearly all people with COPD. Some people may also benefit from a pulmonary rehabilitation program.  HOME CARE INSTRUCTIONS   · Take all medicines (inhaled or pills) as directed by your health care provider.  · Only take over-the-counter or prescription medicines  for pain, fever, or discomfort as directed by your health care provider.    · Avoid over-the-counter medicines or cough syrups that dry up your airway (such as antihistamines) and slow down the elimination of secretions unless instructed otherwise by your healthcare provider.    · If you are a smoker, the most important thing that you can do is stop smoking. Continuing to smoke will cause further lung damage and breathing trouble. Ask your health care provider for help with quitting smoking. He or she can direct you to community resources or hospitals that provide support.  · Avoid exposure to irritants such as smoke, chemicals, and fumes that aggravate your breathing.  · Use oxygen therapy and pulmonary rehabilitation if directed by your health care provider. If you require home oxygen therapy, ask your healthcare provider whether you should purchase a pulse oximeter to measure your oxygen level at home.    · Avoid contact with individuals who have a contagious illness.  · Avoid extreme temperature and humidity changes.  · Eat healthy foods. Eating smaller, more frequent meals and resting before meals may help you maintain your strength.  · Stay active, but balance activity with periods of rest. Exercise and physical activity will help you maintain your ability to do things you want to do.  · Preventing infection and hospitalization is very important when you have COPD. Make sure to receive all the vaccines your health care provider recommends, especially the pneumococcal and influenza vaccines. Ask your healthcare provider whether you   need a pneumonia vaccine.  · Learn and use relaxation techniques to manage stress.  · Learn and use controlled breathing techniques as directed by your health care provider. Controlled breathing techniques include:    · Pursed lip breathing. Start by breathing in (inhaling) through your nose for 1 second. Then, purse your lips as if you were going to whistle and breathe out (exhale)  through the pursed lips for 2 seconds.    · Diaphragmatic breathing. Start by putting one hand on your abdomen just above your waist. Inhale slowly through your nose. The hand on your abdomen should move out. Then purse your lips and exhale slowly. You should be able to feel the hand on your abdomen moving in as you exhale.    · Learn and use controlled coughing to clear mucus from your lungs. Controlled coughing is a series of short, progressive coughs. The steps of controlled coughing are:    1. Lean your head slightly forward.    2. Breathe in deeply using diaphragmatic breathing.    3. Try to hold your breath for 3 seconds.    4. Keep your mouth slightly open while coughing twice.    5. Spit any mucus out into a tissue.    6. Rest and repeat the steps once or twice as needed.  SEEK MEDICAL CARE IF:   · You are coughing up more mucus than usual.    · There is a change in the color or thickness of your mucus.    · Your breathing is more labored than usual.    · Your breathing is faster than usual.    SEEK IMMEDIATE MEDICAL CARE IF:   · You have shortness of breath while you are resting.    · You have shortness of breath that prevents you from:  · Being able to talk.    · Performing your usual physical activities.    · You have chest pain lasting longer than 5 minutes.    · Your skin color is more cyanotic than usual.  · You measure low oxygen saturations for longer than 5 minutes with a pulse oximeter.  MAKE SURE YOU:   · Understand these instructions.  · Will watch your condition.  · Will get help right away if you are not doing well or get worse.  Document Released: 06/12/2005 Document Revised: 06/23/2013 Document Reviewed: 04/29/2013  [employer]® Patient Information ©2014 [employer].

## 2014-01-17 DIAGNOSIS — J45901 Unspecified asthma with (acute) exacerbation: Secondary | ICD-10-CM | POA: Diagnosis not present

## 2014-01-17 DIAGNOSIS — J441 Chronic obstructive pulmonary disease with (acute) exacerbation: Secondary | ICD-10-CM | POA: Diagnosis not present

## 2014-01-17 MED ORDER — PNEUMOCOCCAL VAC POLYVALENT 25 MCG/0.5ML IJ INJ: 0.5000 mL | INJECTION | INTRAMUSCULAR | Status: DC

## 2014-01-17 MED ORDER — PNEUMOCOCCAL VAC POLYVALENT 25 MCG/0.5ML IJ INJ
0.5000 mL | INJECTION | Freq: Once | INTRAMUSCULAR | Status: AC
  Administered 2014-01-17: 0.5 mL via INTRAMUSCULAR
  Filled 2014-01-17: qty 0.5

## 2014-01-23 ENCOUNTER — Emergency Department (INDEPENDENT_AMBULATORY_CARE_PROVIDER_SITE_OTHER)
Admission: EM | Admit: 2014-01-23 | Discharge: 2014-01-23 | Disposition: A | Discharge status: Home / Self Care | Payer: PRIVATE HEALTH INSURANCE | Source: Home / Self Care | Attending: Emergency Medicine | Admitting: Emergency Medicine

## 2014-01-23 ENCOUNTER — Encounter (HOSPITAL_COMMUNITY): Payer: Self-pay | Admitting: Emergency Medicine

## 2014-01-23 DIAGNOSIS — B370 Candidal stomatitis: Secondary | ICD-10-CM

## 2014-01-23 DIAGNOSIS — N3000 Acute cystitis without hematuria: Secondary | ICD-10-CM

## 2014-01-23 DIAGNOSIS — J449 Chronic obstructive pulmonary disease, unspecified: Secondary | ICD-10-CM

## 2014-01-23 LAB — POCT URINALYSIS DIP (DEVICE)
BILIRUBIN URINE: NEGATIVE
Glucose, UA: NEGATIVE mg/dL
Ketones, ur: NEGATIVE mg/dL
NITRITE: NEGATIVE
PH: 6 (ref 5.0–8.0)
Protein, ur: 30 mg/dL — AB
Specific Gravity, Urine: 1.015 (ref 1.005–1.030)
UROBILINOGEN UA: 0.2 mg/dL (ref 0.0–1.0)

## 2014-01-23 MED ORDER — CEPHALEXIN 500 MG PO CAPS: 500.0000 mg | ORAL_CAPSULE | Freq: Three times a day (TID) | ORAL | Status: DC

## 2014-01-23 MED ORDER — KETOCONAZOLE 200 MG PO TABS: 200.0000 mg | ORAL_TABLET | Freq: Every day | ORAL | Status: DC

## 2014-01-23 NOTE — ED Provider Notes (Signed)
Chief Complaint   Chief Complaint  Patient presents with  . Oral Pain    History of Present Illness   Lori [name] is a 58 year old female with COPD, asthma, and coronary artery disease. She presents with a three-month history of soreness of her mouth, tongue, and calms, and lips. She has had persisting coughing and wheezing. She's on [facility] and was just in the emergency room one week ago with an exacerbation of COPD. She was placed on prednisone for that. She has dysuria, frequency, urgency, vulvar and vaginal burning and itching. She does not want to have a pelvic exam today. She's had subjective fever at nighttime and sweats and continues to have cough productive of white sputum.  Review of Systems     Other than as noted above, the patient denies any of the following symptoms: Systemic:  No fever, chills, fatigue, myalgias, headache, or anorexia. Eye:  No redness, pain or drainage. ENT:  No earache, nasal congestion, rhinorrhea, sinus pressure, or sore throat. Lungs:  No cough, sputum production, wheezing, shortness of breath.  Cardiovascular:  No chest pain, palpitations, or syncope. GI:  No nausea, vomiting, abdominal pain or diarrhea. GU:  No dysuria, frequency, or hematuria. Skin:  No rash or pruritis.   [place]     Past medical history, family history, social history, meds, and allergies were reviewed.  She's allergic to codeine. She takes Valtrex, nitroglycerin, Xanax, DEXILANT, Dulera, Singulair, prednisone, and omeprazole. She has a history of COPD, coronary artery disease with MI, asthma, there is esophagitis, reflux esophagitis, anxiety, and depression.  Physical Examination    Vital signs:  BP 104/64  Pulse 85  Temp(Src) 99.2 F (37.3 C) (Oral)  Resp 20  SpO2 100% General:  Alert, in no distress. Eye:  PERRL, full EOMs.  Lids and conjunctivas were normal. ENT:  TMs and canals were normal, without erythema or inflammation.  Nasal mucosa was clear and uncongested,  without drainage.  Mucous membranes were moist.  Pharynx was clear, without exudate or drainage.  There are white patches on her buccal mucosa, at the corners of her mouth, on her tongue, and are hard and soft palate. Neck:  Supple, no adenopathy, tenderness or mass. Thyroid was normal. Lungs:  No respiratory distress.  Has bilateral expiratory wheezes, no rales or rhonchi. Heart:  Regular rhythm, without gallops, murmers or rubs. Abdomen:  Soft, flat, and non-tender to palpation.  No hepatosplenomagaly or mass. Skin:  Clear, warm, and dry, without rash or lesions.  Labs    Results for orders placed during the hospital encounter of 01/23/14  POCT URINALYSIS DIP (DEVICE)      Result Value Ref Range   Glucose, UA NEGATIVE  NEGATIVE mg/dL   Bilirubin Urine NEGATIVE  NEGATIVE   Ketones, ur NEGATIVE  NEGATIVE mg/dL   Specific Gravity, Urine 1.015  1.005 - 1.030   Hgb urine dipstick TRACE (*) NEGATIVE   pH 6.0  5.0 - 8.0   Protein, ur 30 (*) NEGATIVE mg/dL   Urobilinogen, UA 0.2  0.0 - 1.0 mg/dL   Nitrite NEGATIVE  NEGATIVE   Leukocytes, UA SMALL (*) NEGATIVE    The urine was cultured.  Assessment   The primary encounter diagnosis was Thrush. Diagnoses of Acute cystitis and COPD (chronic obstructive pulmonary disease) were also pertinent to this visit.  Oral candidiasis secondary to use of inhalers and prednisone. May have acute cystitis as well although this might be a manifestation of candidal vulvar vaginitis. COPD appears to be  stable.  Plan     1.  Meds:  The following meds were prescribed:   Discharge Medication List as of 01/23/2014  5:10 PM    START taking these medications   Details  cephALEXin (KEFLEX) 500 MG capsule Take 1 capsule (500 mg total) by mouth 3 (three) times daily., Starting 01/23/2014, Until Discontinued, Normal    ketoconazole (NIZORAL) 200 MG tablet Take 1 tablet (200 mg total) by mouth daily., Starting 01/23/2014, Until Discontinued, Normal        2.   Patient Education/Counseling:  The patient was given appropriate handouts, self care instructions, and instructed in symptomatic relief.    3.  Follow up:  The patient was told to follow up here if no better in 3 to 4 days, or sooner if becoming worse in any way, and given some red flag symptoms such as worsening respiratory distress, persisting soreness of the mouth, fever, vomiting, or abdominal pain which would prompt immediate return.  Follow up here or with her primary care physician as needed.        Harden Mo, MD 01/23/14 [phone]

## 2014-01-23 NOTE — ED Notes (Signed)
Pt very inconsistent when answering questions.  States she's here for oral pain and swelling x 3.5 months ("my doctor don't do nothin' for it").  C/O lesions in mouth with severe pain in mouth and throat and "raw tongue".  C/O difficulty sleeping and SOB.  States she "still has kidney infection and yeast infection" from ED visit past week.

## 2014-01-23 NOTE — Discharge Instructions (Signed)
Candida Infection, Adult A candida infection (also called yeast, fungus and Monilia infection) is an overgrowth of yeast that can occur anywhere on the body. A yeast infection commonly occurs in warm, moist body areas. Usually, the infection remains localized but can spread to become a systemic infection. A yeast infection may be a sign of a more severe disease such as diabetes, leukemia, or AIDS. A yeast infection can occur in both men and women. In women, Candida vaginitis is a vaginal infection. It is one of the most common causes of vaginitis. Men usually do not have symptoms or know they have an infection until other problems develop. Men may find out they have a yeast infection because their sex partner has a yeast infection. Uncircumcised men are more likely to get a yeast infection than circumcised men. This is because the uncircumcised glans is not exposed to air and does not remain as dry as that of a circumcised glans. Older adults may develop yeast infections around dentures. CAUSES  Women  Antibiotics.  Steroid medication taken for a long time.  Being overweight (obese).  Diabetes.  Poor immune condition.  Certain serious medical conditions.  Immune suppressive medications for organ transplant patients.  Chemotherapy.  Pregnancy.  Menstration.  Stress and fatigue.  Intravenous drug use.  Oral contraceptives.  Wearing tight-fitting clothes in the crotch area.  Catching it from a sex partner who has a yeast infection.  Spermicide.  Intravenous, urinary, or other catheters. Men  Catching it from a sex partner who has a yeast infection.  Having oral or anal sex with a person who has the infection.  Spermicide.  Diabetes.  Antibiotics.  Poor immune system.  Medications that suppress the immune system.  Intravenous drug use.  Intravenous, urinary, or other catheters. SYMPTOMS  Women  Thick, white vaginal discharge.  Vaginal itching.  Redness and  swelling in and around the vagina.  Irritation of the lips of the vagina and perineum.  Blisters on the vaginal lips and perineum.  Painful sexual intercourse.  Low blood sugar (hypoglycemia).  Painful urination.  Bladder infections.  Intestinal problems such as constipation, indigestion, bad breath, bloating, increase in gas, diarrhea, or loose stools. Men  Men may develop intestinal problems such as constipation, indigestion, bad breath, bloating, increase in gas, diarrhea, or loose stools.  Dry, cracked skin on the penis with itching or discomfort.  Jock itch.  Dry, flaky skin.  Athlete's foot.  Hypoglycemia. DIAGNOSIS  Women  A history and an exam are performed.  The discharge may be examined under a microscope.  A culture may be taken of the discharge. Men  A history and an exam are performed.  Any discharge from the penis or areas of cracked skin will be looked at under the microscope and cultured.  Stool samples may be cultured. TREATMENT  Women  Vaginal antifungal suppositories and creams.  Medicated creams to decrease irritation and itching on the outside of the vagina.  Warm compresses to the perineal area to decrease swelling and discomfort.  Oral antifungal medications.  Medicated vaginal suppositories or cream for repeated or recurrent infections.  Wash and dry the irritation areas before applying the cream.  Eating yogurt with lactobacillus may help with prevention and treatment.  Sometimes painting the vagina with gentian violet solution may help if creams and suppositories do not work. Men  Antifungal creams and oral antifungal medications.  Sometimes treatment must continue for 30 days after the symptoms go away to prevent recurrence. HOME CARE   INSTRUCTIONS  Women  Use cotton underwear and avoid tight-fitting clothing.  Avoid colored, scented toilet paper and deodorant tampons or pads.  Do not douche.  Keep your diabetes  under control.  Finish all the prescribed medications.  Keep your skin clean and dry.  Consume milk or yogurt with lactobacillus active culture regularly. If you get frequent yeast infections and think that is what the infection is, there are over-the-counter medications that you can get. If the infection does not show healing in 3 days, talk to your caregiver.  Tell your sex partner you have a yeast infection. Your partner may need treatment also, especially if your infection does not clear up or recurs. Men  Keep your skin clean and dry.  Keep your diabetes under control.  Finish all prescribed medications.  Tell your sex partner that you have a yeast infection so they can be treated if necessary. SEEK MEDICAL CARE IF:   Your symptoms do not clear up or worsen in one week after treatment.  You have an oral temperature above 102 F (38.9 C).  You have trouble swallowing or eating for a prolonged time.  You develop blisters on and around your vagina.  You develop vaginal bleeding and it is not your menstrual period.  You develop abdominal pain.  You develop intestinal problems as mentioned above.  You get weak or lightheaded.  You have painful or increased urination.  You have pain during sexual intercourse. MAKE SURE YOU:   Understand these instructions.  Will watch your condition.  Will get help right away if you are not doing well or get worse. Document Released: 10/10/2004 Document Revised: 11/25/2011 Document Reviewed: 01/22/2010 [facility] Patient Information 2014 [place].  Thrush, Adult  Ritta Slot, also called oral candidiasis, is a fungal infection that develops in the mouth and throat and on the tongue. It causes white patches to form on the mouth and tongue. Ritta Slot is most common in older adults, but it can occur at any age.  Many cases of thrush are mild, but this infection can also be more serious. Ritta Slot can be a recurring problem for people who  have chronic illnesses or who take medicines that limit the body's ability to fight infection. Because these people have difficulty fighting infections, the fungus that causes thrush can spread throughout the body. This can cause life-threatening blood or organ infections. CAUSES  Ritta Slot is usually caused by a yeast called Candida albicans. This fungus is normally present in small amounts in the mouth and on other mucous membranes. It usually causes no harm. However, when conditions are present that allow the fungus to grow uncontrolled, it invades surrounding tissues and becomes an infection. Less often, other Candida species can also lead to thrush.  RISK FACTORS Ritta Slot is more likely to develop in the following people:  People with an impaired ability to fight infection (weakened immune system).   Older adults.   People with HIV.   People with diabetes.   People with dry mouth (xerostomia).   Pregnant women.   People with poor dental care, especially those who have false teeth.   People who use antibiotic medicines.  SIGNS AND SYMPTOMS  Ritta Slot can be a mild infection that causes no symptoms. If symptoms develop, they may include:   A burning feeling in the mouth and throat. This can occur at the start of a thrush infection.   White patches that adhere to the mouth and tongue. The tissue around the patches may be red,  raw, and painful. If rubbed (during tooth brushing, for example), the patches and the tissue of the mouth may bleed easily.   A bad taste in the mouth or difficulty tasting foods.   Cottony feeling in the mouth.   Pain during eating and swallowing. DIAGNOSIS  Your health care provider can usually diagnose thrush by looking in your mouth and asking you questions about your health.  TREATMENT  Medicines that help prevent the growth of fungi (antifungals) are the standard treatment for thrush. These medicines are either applied directly to the affected  area (topical) or swallowed (oral). The treatment will depend on the severity of the condition.  Mild Thrush Mild cases of thrush may clear up with the use of an antifungal mouth rinse or lozenges. Treatment usually lasts about 14 days.  Moderate to Severe Thrush  More severe thrush infections that have spread to the esophagus are treated with an oral antifungal medicine. A topical antifungal medicine may also be used.   For some severe infections, a treatment period longer than 14 days may be needed.   Oral antifungal medicines are almost never used during pregnancy because the fetus may be harmed. However, if a pregnant woman has a rare, severe thrush infection that has spread to her blood, oral antifungal medicines may be used. In this case, the risk of harm to the mother and fetus from the severe thrush infection may be greater than the risk posed by the use of antifungal medicines.  Persistent or Recurrent Thrush For cases of thrush that do not go away or keep coming back, treatment may involve the following:   Treatment may be needed twice as long as the symptoms last.   Treatment will include both oral and topical antifungal medicines.   People with weakened immune systems can take an antifungal medicine on a continuous basis to prevent thrush infections.  It is important to treat conditions that make you more likely to get thrush, such as diabetes or HIV.  HOME CARE INSTRUCTIONS   Only take over-the-counter or prescription medicine as directed by your health care provider. Talk to your health care provider about an over-the-counter medicine called gentian violet, which kills bacteria and fungi.   Eat plain, unflavored yogurt as directed by your health care provider. Check the label to make sure the yogurt contains live cultures. This yogurt can help healthy bacteria grow in the mouth that can stop the growth of the fungus that causes thrush.   Try these measures to help  reduce the discomfort of thrush:   Drink cold liquids such as water or iced tea.   Try flavored ice treats or frozen juices.   Eat foods that are easy to swallow, such as gelatin, ice cream, or custard.   If the patches in your mouth are painful, try drinking from a straw.   Rinse your mouth several times a day with a warm saltwater rinse. You can make the saltwater mixture with 1 tsp (6 g) of salt in 8 fl oz (0.2 L) of warm water.   If you wear dentures, remove the dentures before going to bed, brush them vigorously, and soak them in a cleaning solution as directed by your health care provider.   Women who are breastfeeding should clean their nipples with an antifungal medicine as directed by their health care provider. Dry the nipples after breastfeeding. Applying lanolin-containing body lotion may help relieve nipple soreness.  SEEK MEDICAL CARE IF:  Your symptoms are getting worse  or are not improving within 7 days of starting treatment.   You have symptoms of spreading infection, such as white patches on the skin outside of the mouth.   You are nursing and you have redness, burning, or pain in the nipples that is not relieved with treatment.  MAKE SURE YOU:  Understand these instructions.  Will watch your condition.  Will get help right away if you are not doing well or get worse. Document Released: 05/28/2004 Document Revised: 06/23/2013 Document Reviewed: 04/05/2013 [facility] Patient Information 2014 [place].  Urinary Tract Infection Urinary tract infections (UTIs) can develop anywhere along your urinary tract. Your urinary tract is your body's drainage system for removing wastes and extra water. Your urinary tract includes two kidneys, two ureters, a bladder, and a urethra. Your kidneys are a pair of bean-shaped organs. Each kidney is about the size of your fist. They are located below your ribs, one on each side of your spine. CAUSES Infections are caused  by microbes, which are microscopic organisms, including fungi, viruses, and bacteria. These organisms are so small that they can only be seen through a microscope. Bacteria are the microbes that most commonly cause UTIs. SYMPTOMS  Symptoms of UTIs may vary by age and gender of the patient and by the location of the infection. Symptoms in young women typically include a frequent and intense urge to urinate and a painful, burning feeling in the bladder or urethra during urination. Older women and men are more likely to be tired, shaky, and weak and have muscle aches and abdominal pain. A fever may mean the infection is in your kidneys. Other symptoms of a kidney infection include pain in your back or sides below the ribs, nausea, and vomiting. DIAGNOSIS To diagnose a UTI, your caregiver will ask you about your symptoms. Your caregiver also will ask to provide a urine sample. The urine sample will be tested for bacteria and white blood cells. White blood cells are made by your body to help fight infection. TREATMENT  Typically, UTIs can be treated with medication. Because most UTIs are caused by a bacterial infection, they usually can be treated with the use of antibiotics. The choice of antibiotic and length of treatment depend on your symptoms and the type of bacteria causing your infection. HOME CARE INSTRUCTIONS  If you were prescribed antibiotics, take them exactly as your caregiver instructs you. Finish the medication even if you feel better after you have only taken some of the medication.  Drink enough water and fluids to keep your urine clear or pale yellow.  Avoid caffeine, tea, and carbonated beverages. They tend to irritate your bladder.  Empty your bladder often. Avoid holding urine for long periods of time.  Empty your bladder before and after sexual intercourse.  After a bowel movement, women should cleanse from front to back. Use each tissue only once. SEEK MEDICAL CARE IF:   You  have back pain.  You develop a fever.  Your symptoms do not begin to resolve within 3 days. SEEK IMMEDIATE MEDICAL CARE IF:   You have severe back pain or lower abdominal pain.  You develop chills.  You have nausea or vomiting.  You have continued burning or discomfort with urination. MAKE SURE YOU:   Understand these instructions.  Will watch your condition.  Will get help right away if you are not doing well or get worse. Document Released: 06/12/2005 Document Revised: 03/03/2012 Document Reviewed: 10/11/2011 [facility] Patient Information 2014 [place].

## 2014-01-25 LAB — URINE CULTURE: Colony Count: 60000

## 2014-04-17 IMAGING — CR DG CHEST 2V
2 series · 2 of 2 positions shown · non-contrast
Comparison: Chest radiograph 03/28/2011.  Chest CT 03/31/2009.

CLINICAL DATA: Chest pain shortness of breath

CHEST - 2 VIEW

[w chest pa]
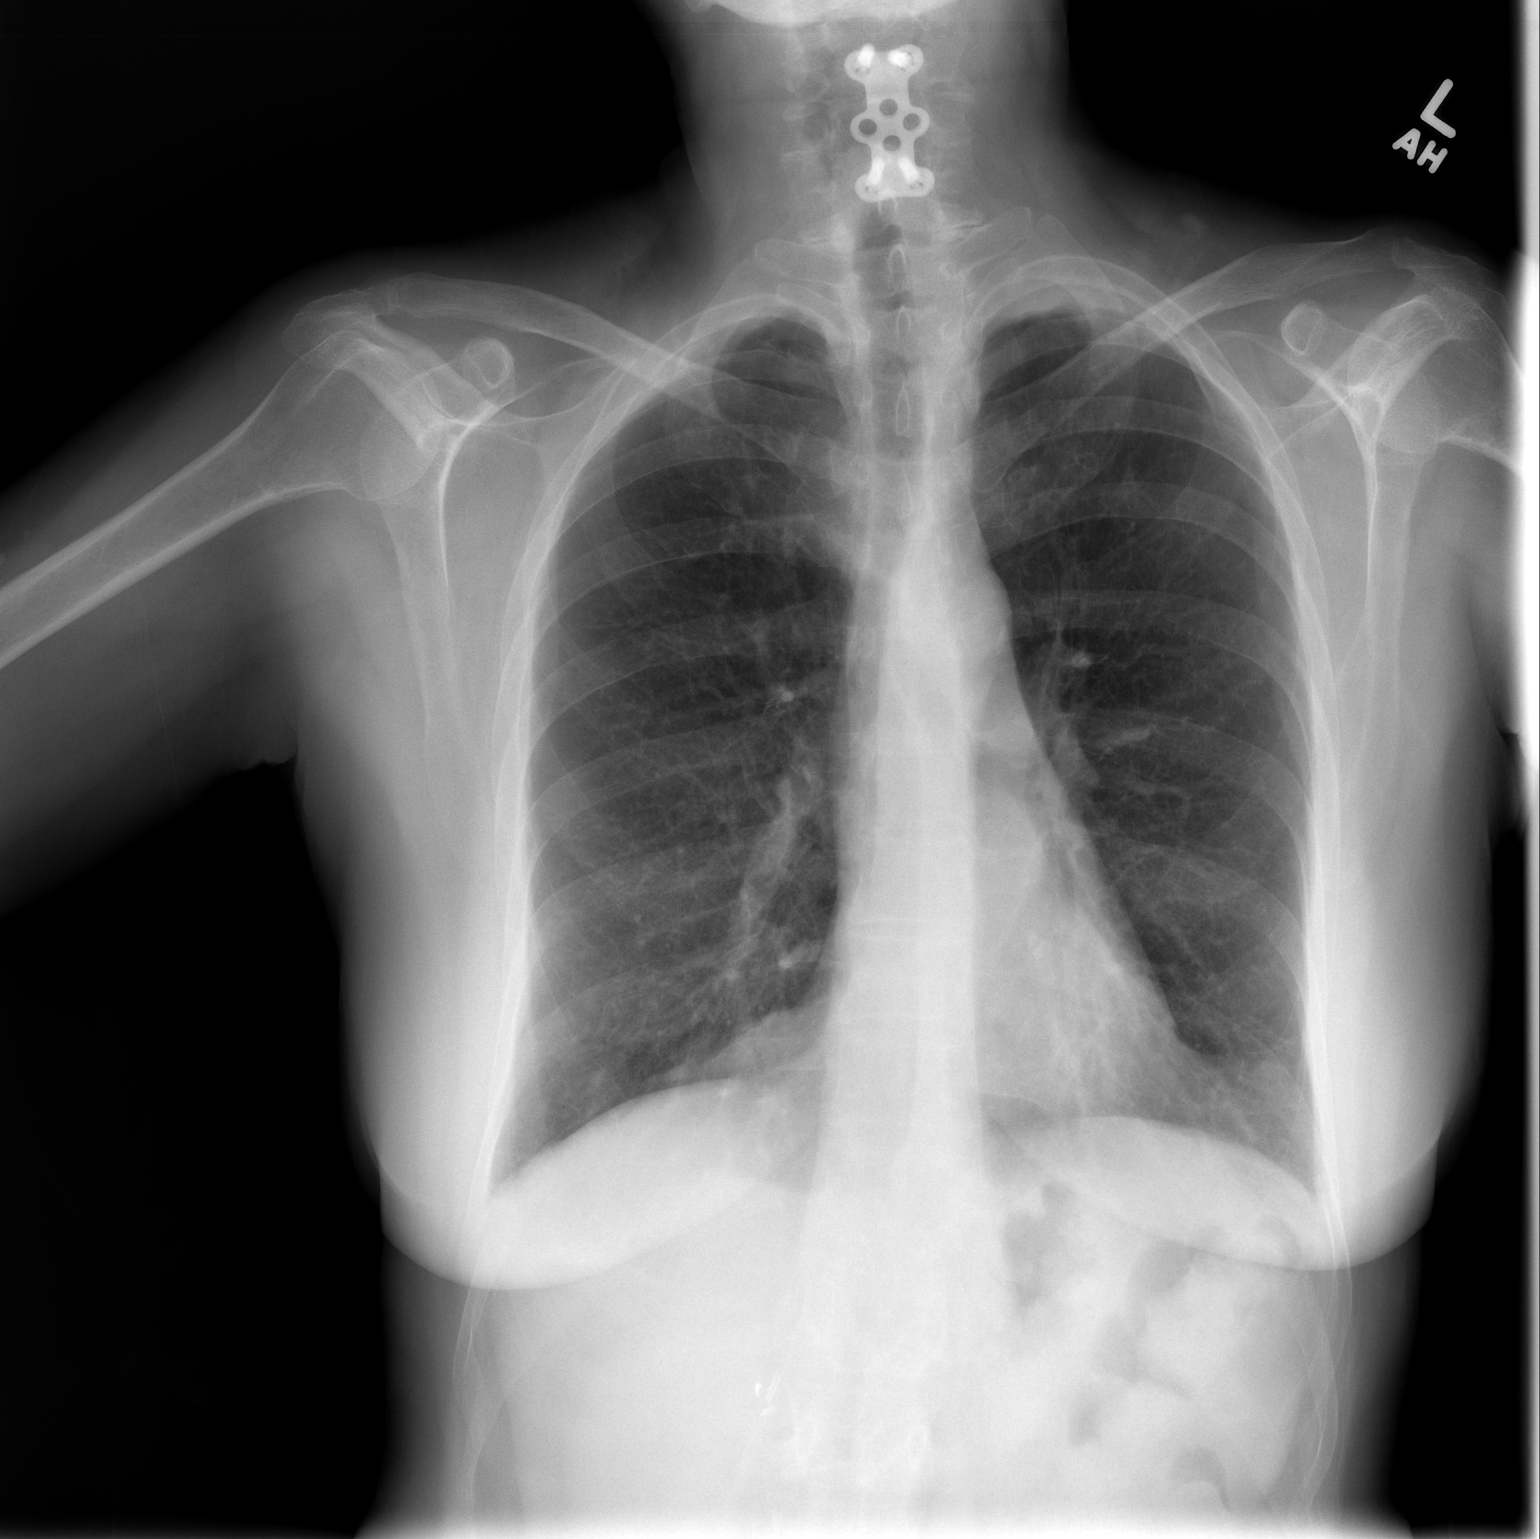

[w chest lat]
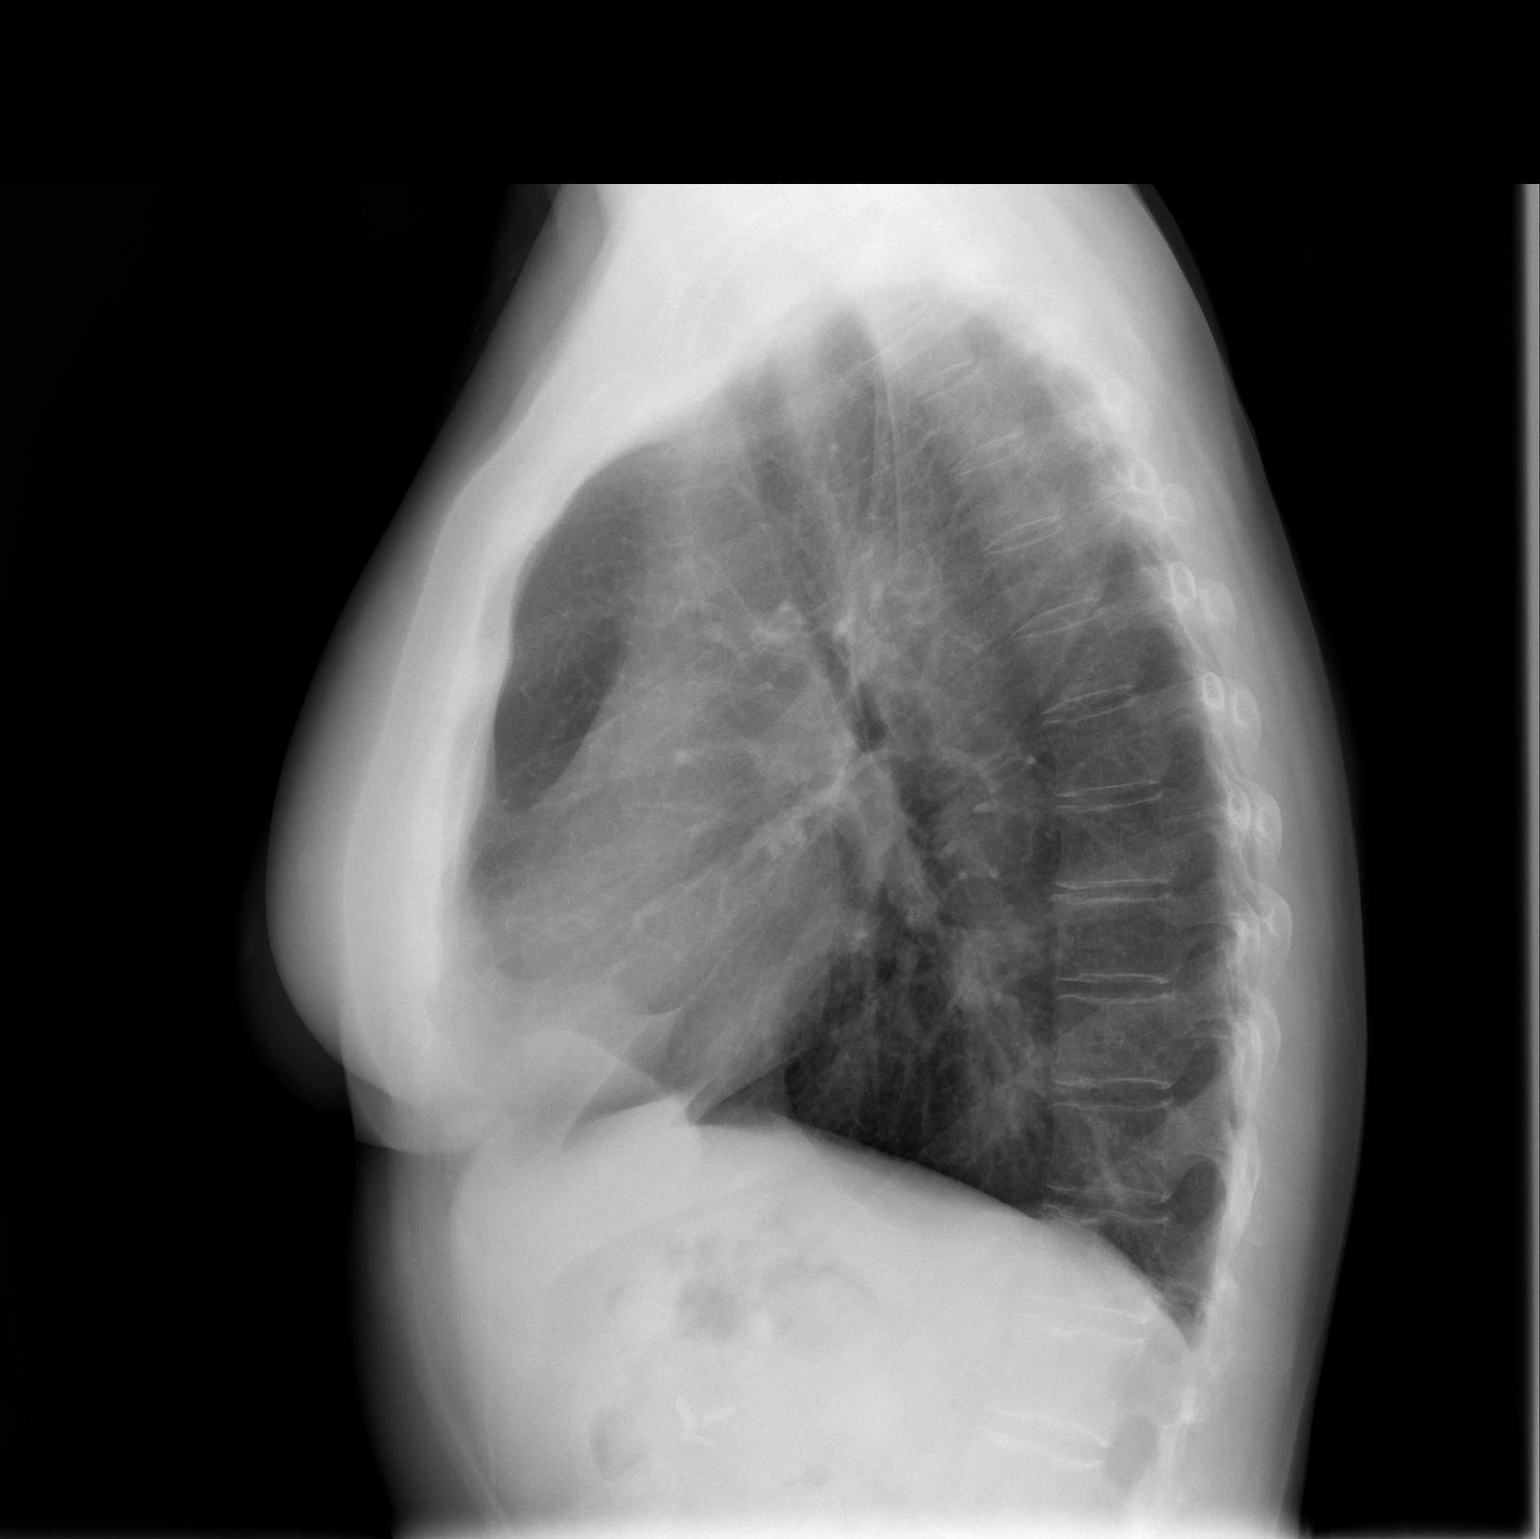

[2 of 2 positions shown; findings below may reference images not displayed]

FINDINGS: Heart, mediastinal, and hilar contours are normal.  Lungs
are hyperinflated and clear.  There is no pleural effusion.  The
trachea is midline.  There are postsurgical changes of anterior
fusion of the lower cervical spine.  Cholecystectomy clips noted.
The bones are diffusely osteopenic.  The thoracic spine vertebral
bodies are normal in height and alignment.
IMPRESSION: Hyperinflation suggests emphysema/COPD.  No acute findings.

## 2014-04-20 ENCOUNTER — Other Ambulatory Visit (HOSPITAL_COMMUNITY): Payer: Self-pay | Admitting: Specialist

## 2014-04-20 DIAGNOSIS — Z1231 Encounter for screening mammogram for malignant neoplasm of breast: Secondary | ICD-10-CM

## 2014-05-03 ENCOUNTER — Ambulatory Visit (HOSPITAL_COMMUNITY)
Admission: RE | Admit: 2014-05-03 | Discharge: 2014-05-03 | Disposition: A | Discharge status: Home / Self Care | Payer: PRIVATE HEALTH INSURANCE | Source: Ambulatory Visit | Attending: Specialist | Admitting: Specialist

## 2014-05-03 DIAGNOSIS — Z1231 Encounter for screening mammogram for malignant neoplasm of breast: Secondary | ICD-10-CM | POA: Insufficient documentation

## 2014-07-11 IMAGING — CR DG RIBS W/ CHEST 3+V*L*
5 series · 5 of 5 positions shown · non-contrast
Comparison: 02/22/2012

CLINICAL DATA: Chest pain

LEFT RIBS AND CHEST - 3+ VIEW

[w chest pa]
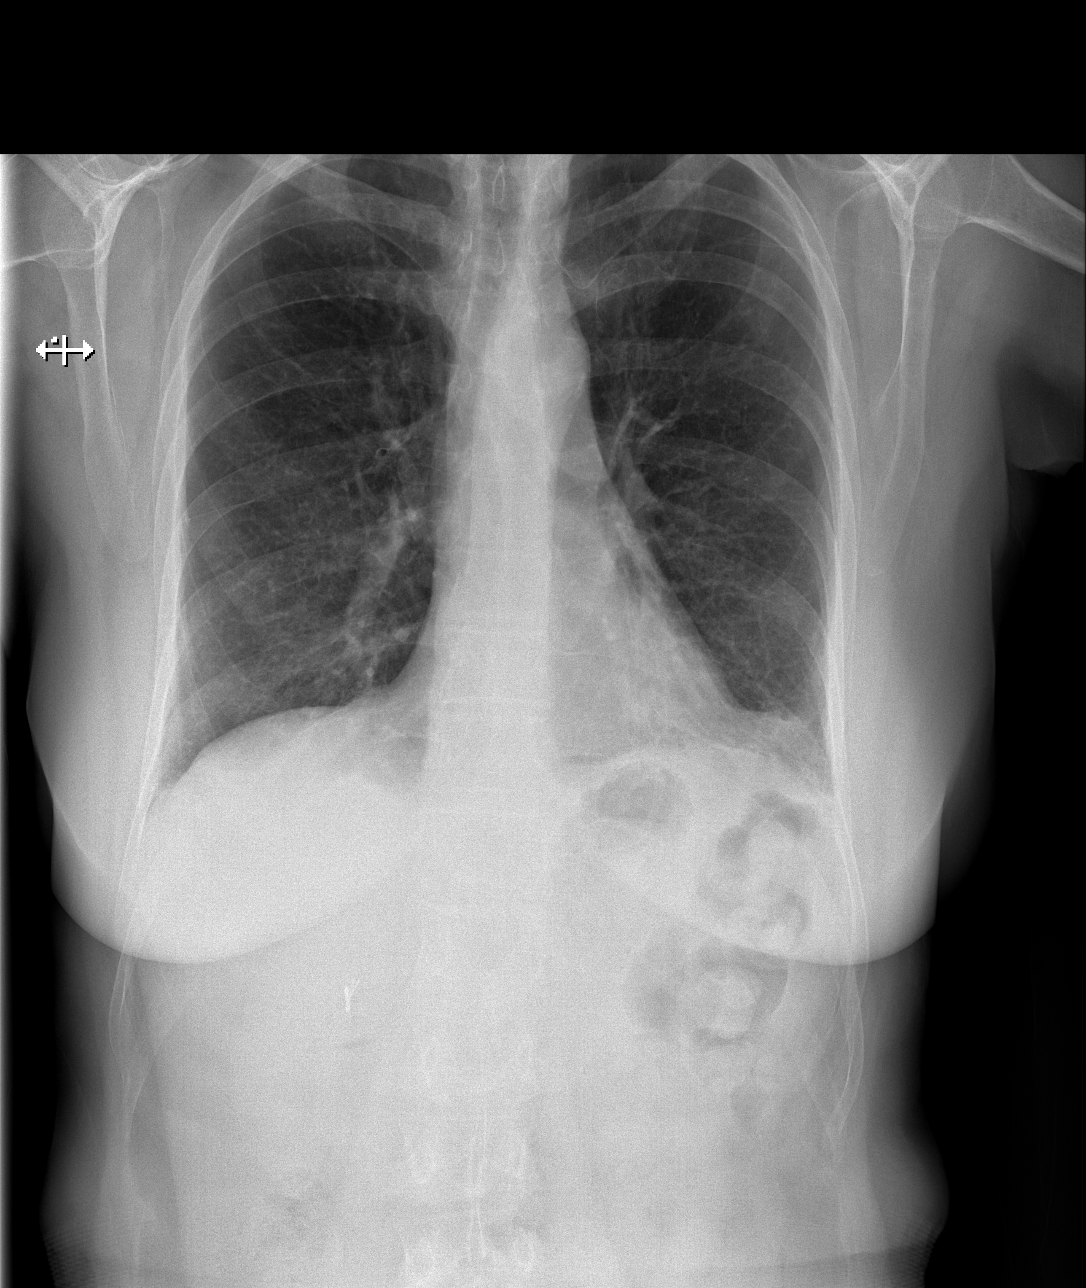

[w ribs ap upper left]
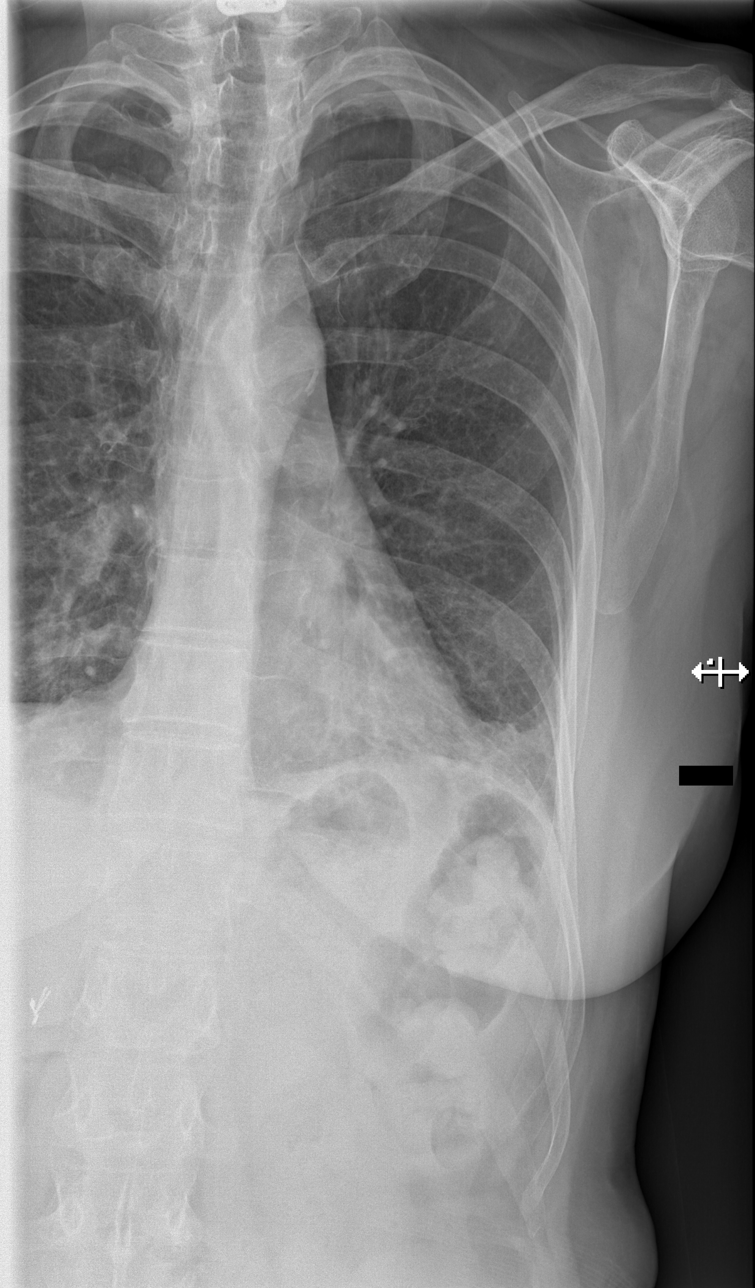

[w ribs ap lower left]
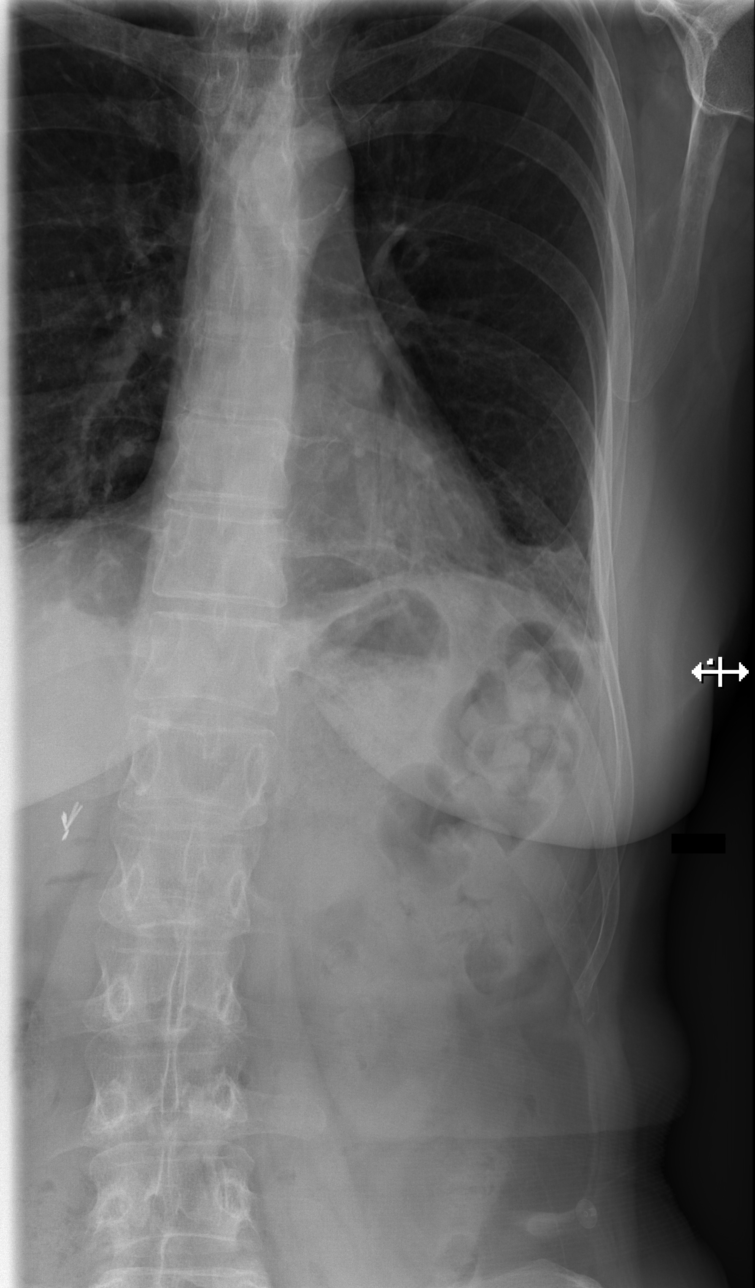

[w ribs obl left (1 of 2)]
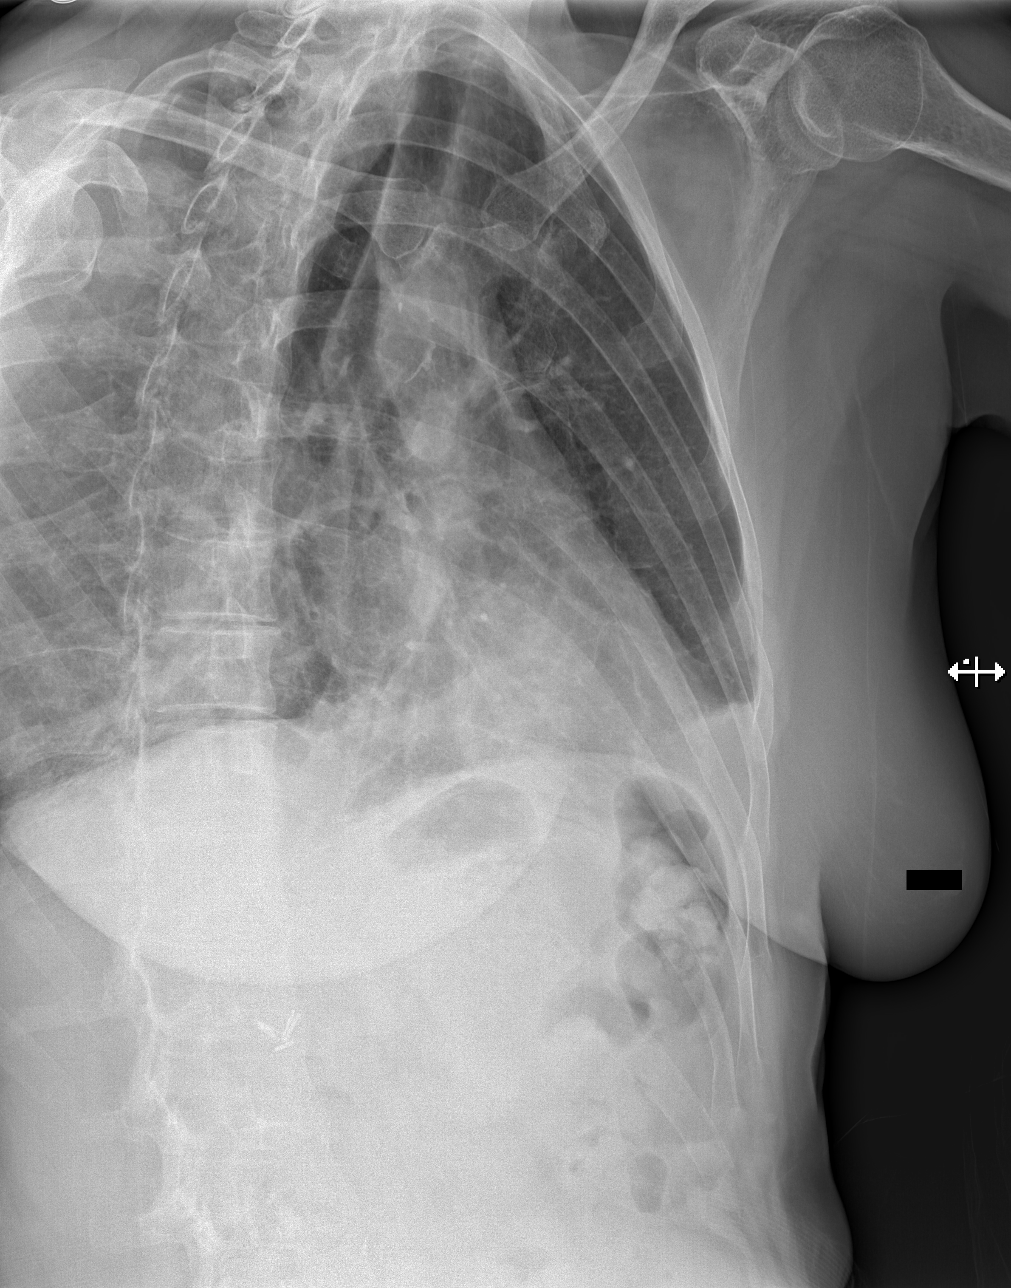

[w ribs obl left (2 of 2)]
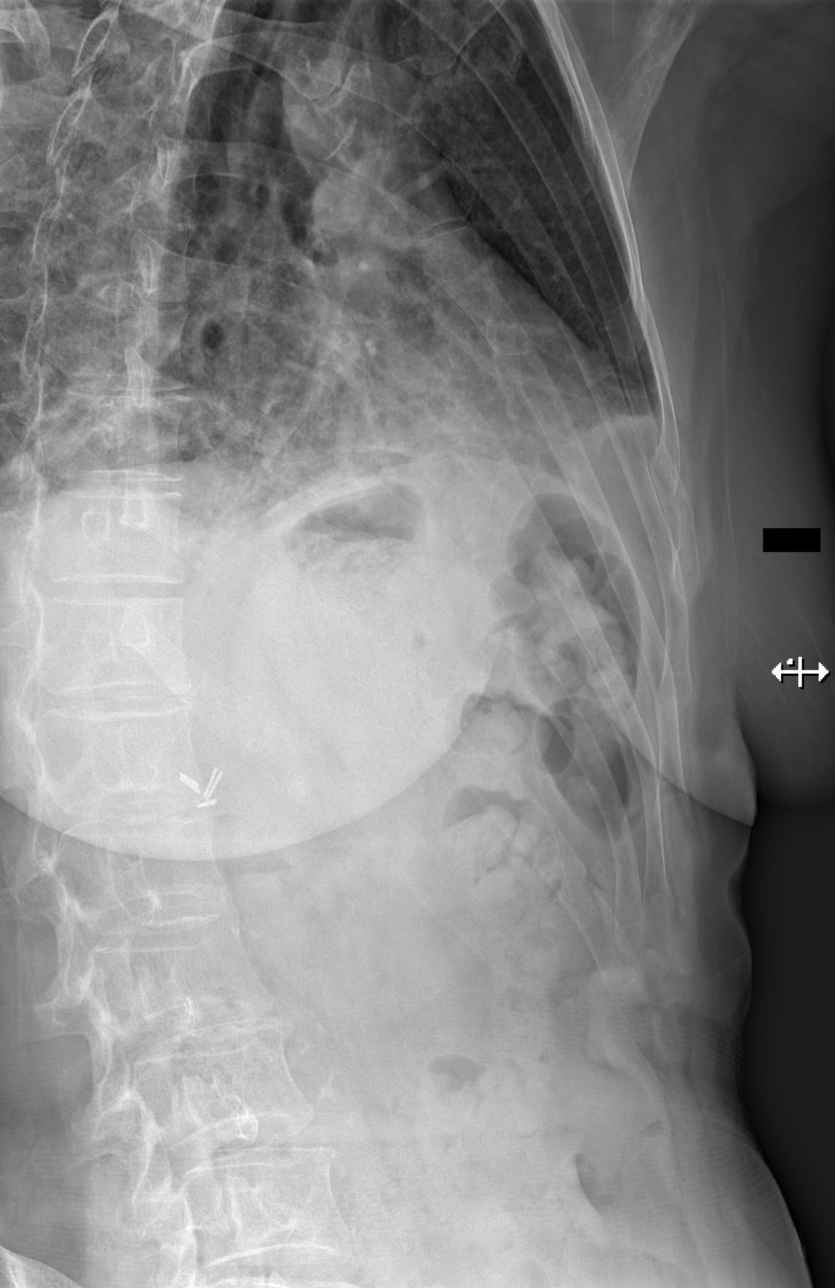

[5 of 5 positions shown; findings below may reference images not displayed]

FINDINGS: Heart size is normal.  No pleural effusion or edema
identified.  Plate-like atelectasis is noted in the left base.  No
displaced rib fractures identified.  Right lung appears clear.
IMPRESSION: 1.  No displaced rib fractures noted.
2.  Left base atelectasis.

## 2014-07-20 ENCOUNTER — Emergency Department (HOSPITAL_COMMUNITY)
Admission: EM | Admit: 2014-07-20 | Discharge: 2014-07-20 | Disposition: A | Discharge status: Home / Self Care | Payer: PRIVATE HEALTH INSURANCE | Attending: Emergency Medicine | Admitting: Emergency Medicine

## 2014-07-20 ENCOUNTER — Encounter (HOSPITAL_COMMUNITY): Payer: Self-pay

## 2014-07-20 DIAGNOSIS — I252 Old myocardial infarction: Secondary | ICD-10-CM | POA: Insufficient documentation

## 2014-07-20 DIAGNOSIS — R42 Dizziness and giddiness: Secondary | ICD-10-CM | POA: Diagnosis present

## 2014-07-20 DIAGNOSIS — Z792 Long term (current) use of antibiotics: Secondary | ICD-10-CM | POA: Diagnosis not present

## 2014-07-20 DIAGNOSIS — Z79899 Other long term (current) drug therapy: Secondary | ICD-10-CM | POA: Insufficient documentation

## 2014-07-20 DIAGNOSIS — J449 Chronic obstructive pulmonary disease, unspecified: Secondary | ICD-10-CM | POA: Insufficient documentation

## 2014-07-20 DIAGNOSIS — Z91040 Latex allergy status: Secondary | ICD-10-CM | POA: Diagnosis not present

## 2014-07-20 DIAGNOSIS — Z720 Tobacco use: Secondary | ICD-10-CM | POA: Insufficient documentation

## 2014-07-20 DIAGNOSIS — Z8781 Personal history of (healed) traumatic fracture: Secondary | ICD-10-CM | POA: Diagnosis not present

## 2014-07-20 DIAGNOSIS — K219 Gastro-esophageal reflux disease without esophagitis: Secondary | ICD-10-CM | POA: Diagnosis not present

## 2014-07-20 DIAGNOSIS — Z7951 Long term (current) use of inhaled steroids: Secondary | ICD-10-CM | POA: Diagnosis not present

## 2014-07-20 LAB — COMPREHENSIVE METABOLIC PANEL
ALT: 12 U/L (ref 0–35)
AST: 13 U/L (ref 0–37)
Albumin: 3.7 g/dL (ref 3.5–5.2)
Alkaline Phosphatase: 130 U/L — ABNORMAL HIGH (ref 39–117)
Anion gap: 13 (ref 5–15)
BUN: 6 mg/dL (ref 6–23)
CALCIUM: 9.4 mg/dL (ref 8.4–10.5)
CO2: 24 mEq/L (ref 19–32)
Chloride: 104 mEq/L (ref 96–112)
Creatinine, Ser: 0.79 mg/dL (ref 0.50–1.10)
GFR calc non Af Amer: >90 mL/min (ref >90)
GLUCOSE: 95 mg/dL (ref 70–99)
Potassium: 4.2 mEq/L (ref 3.7–5.3)
SODIUM: 141 meq/L (ref 137–147)
TOTAL PROTEIN: 7.2 g/dL (ref 6.0–8.3)
Total Bilirubin: <0.2 mg/dL — ABNORMAL LOW (ref 0.3–1.2)

## 2014-07-20 LAB — CBC WITH DIFFERENTIAL/PLATELET
Basophils Absolute: 0 10*3/uL (ref 0.0–0.1)
Basophils Relative: 0 % (ref 0–1)
EOS ABS: 0.2 10*3/uL (ref 0.0–0.7)
EOS PCT: 3 % (ref 0–5)
HCT: 36.6 % (ref 36.0–46.0)
Hemoglobin: 12.4 g/dL (ref 12.0–15.0)
Lymphocytes Relative: 37 % (ref 12–46)
Lymphs Abs: 2.8 10*3/uL (ref 0.7–4.0)
MCH: 27 pg (ref 26.0–34.0)
MCHC: 33.9 g/dL (ref 30.0–36.0)
MCV: 79.7 fL (ref 78.0–100.0)
Monocytes Absolute: 0.3 10*3/uL (ref 0.1–1.0)
Monocytes Relative: 5 % (ref 3–12)
Neutro Abs: 4.2 10*3/uL (ref 1.7–7.7)
Neutrophils Relative %: 55 % (ref 43–77)
PLATELETS: 341 10*3/uL (ref 150–400)
RBC: 4.59 MIL/uL (ref 3.87–5.11)
RDW: 15.8 % — ABNORMAL HIGH (ref 11.5–15.5)
WBC: 7.6 10*3/uL (ref 4.0–10.5)

## 2014-07-20 LAB — URINALYSIS, ROUTINE W REFLEX MICROSCOPIC
Bilirubin Urine: NEGATIVE
Glucose, UA: NEGATIVE mg/dL
Ketones, ur: NEGATIVE mg/dL
LEUKOCYTES UA: NEGATIVE
NITRITE: NEGATIVE
Protein, ur: NEGATIVE mg/dL
SPECIFIC GRAVITY, URINE: 1.007 (ref 1.005–1.030)
UROBILINOGEN UA: 0.2 mg/dL (ref 0.0–1.0)
pH: 6 (ref 5.0–8.0)

## 2014-07-20 LAB — I-STAT CG4 LACTIC ACID, ED: LACTIC ACID, VENOUS: 1.08 mmol/L (ref 0.5–2.2)

## 2014-07-20 LAB — URINE MICROSCOPIC-ADD ON

## 2014-07-20 MED ORDER — LORAZEPAM 1 MG PO TABS: 1.0000 mg | ORAL_TABLET | Freq: Three times a day (TID) | ORAL | Status: DC | PRN

## 2014-07-20 MED ORDER — MECLIZINE HCL 25 MG PO TABS: 25.0000 mg | ORAL_TABLET | Freq: Three times a day (TID) | ORAL | Status: AC

## 2014-07-20 MED ORDER — PREDNISONE 20 MG PO TABS: 40.0000 mg | ORAL_TABLET | Freq: Every day | ORAL | Status: AC

## 2014-07-20 MED ORDER — PREDNISONE 20 MG PO TABS
60.0000 mg | ORAL_TABLET | ORAL | Status: AC
  Administered 2014-07-20: 60 mg via ORAL
  Filled 2014-07-20: qty 3

## 2014-07-20 MED ORDER — SODIUM CHLORIDE 0.9 % IV BOLUS (SEPSIS)
1000.0000 mL | Freq: Once | INTRAVENOUS | Status: AC
  Administered 2014-07-20: 1000 mL via INTRAVENOUS

## 2014-07-20 MED ORDER — LORAZEPAM 2 MG/ML IJ SOLN
1.0000 mg | Freq: Once | INTRAMUSCULAR | Status: AC
  Administered 2014-07-20: 1 mg via INTRAVENOUS
  Filled 2014-07-20: qty 1

## 2014-07-20 NOTE — Discharge Instructions (Signed)
As discussed, your evaluation today has been largely reassuring.  But, it is important that you monitor your condition carefully, and do not hesitate to return to the ED if you develop new, or concerning changes in your condition. ° °Otherwise, please follow-up with your physician for appropriate ongoing care. ° ° °Dizziness °Dizziness is a common problem. It is a feeling of unsteadiness or light-headedness. You may feel like you are about to faint. Dizziness can lead to injury if you stumble or fall. A person of any age group can suffer from dizziness, but dizziness is more common in older adults. °CAUSES  °Dizziness can be caused by many different things, including: °· Middle ear problems. °· Standing for too long. °· Infections. °· An allergic reaction. °· Aging. °· An emotional response to something, such as the sight of blood. °· Side effects of medicines. °· Tiredness. °· Problems with circulation or blood pressure. °· Excessive use of alcohol or medicines, or illegal drug use. °· Breathing too fast (hyperventilation). °· An irregular heart rhythm (arrhythmia). °· A low red blood cell count (anemia). °· Pregnancy. °· Vomiting, diarrhea, fever, or other illnesses that cause body fluid loss (dehydration). °· Diseases or conditions such as Parkinson's disease, high blood pressure (hypertension), diabetes, and thyroid problems. °· Exposure to extreme heat. °DIAGNOSIS  °Your health care provider will ask about your symptoms, perform a physical exam, and perform an electrocardiogram (ECG) to record the electrical activity of your heart. Your health care provider may also perform other heart or blood tests to determine the cause of your dizziness. These may include: °· Transthoracic echocardiogram (TTE). During echocardiography, sound waves are used to evaluate how blood flows through your heart. °· Transesophageal echocardiogram (TEE). °· Cardiac monitoring. This allows your health care provider to monitor your  heart rate and rhythm in real time. °· Holter monitor. This is a portable device that records your heartbeat and can help diagnose heart arrhythmias. It allows your health care provider to track your heart activity for several days if needed. °· Stress tests by exercise or by giving medicine that makes the heart beat faster. °TREATMENT  °Treatment of dizziness depends on the cause of your symptoms and can vary greatly. °HOME CARE INSTRUCTIONS  °· Drink enough fluids to keep your urine clear or pale yellow. This is especially important in very hot weather. In older adults, it is also important in cold weather. °· Take your medicine exactly as directed if your dizziness is caused by medicines. When taking blood pressure medicines, it is especially important to get up slowly. °¨ Rise slowly from chairs and steady yourself until you feel okay. °¨ In the morning, first sit up on the side of the bed. When you feel okay, stand slowly while holding onto something until you know your balance is fine. °· Move your legs often if you need to stand in one place for a long time. Tighten and relax your muscles in your legs while standing. °· Have someone stay with you for 1-2 days if dizziness continues to be a problem. Do this until you feel you are well enough to stay alone. Have the person call your health care provider if he or she notices changes in you that are concerning. °· Do not drive or use heavy machinery if you feel dizzy. °· Do not drink alcohol. °SEEK IMMEDIATE MEDICAL CARE IF:  °· Your dizziness or light-headedness gets worse. °· You feel nauseous or vomit. °· You have problems talking, walking, or   using your arms, hands, or legs. °· You feel weak. °· You are not thinking clearly or you have trouble forming sentences. It may take a friend or family member to notice this. °· You have chest pain, abdominal pain, shortness of breath, or sweating. °· Your vision changes. °· You notice any bleeding. °· You have side  effects from medicine that seems to be getting worse rather than better. °MAKE SURE YOU:  °· Understand these instructions. °· Will watch your condition. °· Will get help right away if you are not doing well or get worse. °Document Released: 02/26/2001 Document Revised: 09/07/2013 Document Reviewed: 03/22/2011 °[employer]® Patient Information ©2015 [employer]. This information is not intended to replace advice given to you by your health care provider. Make sure you discuss any questions you have with your health care provider. ° °

## 2014-07-20 NOTE — ED Notes (Signed)
She c/o several recent episodes of "dizziness, like I'm going to pass out".  She denies actually losing consciousness, however, and she denies pain.  Currently, she is awake, alert and oriented x 4 with clear speech.

## 2014-07-20 NOTE — ED Provider Notes (Signed)
CSN: [id number]     Arrival date & time 07/20/14  1155 History   First MD Initiated Contact with Patient 07/20/14 1327     Chief Complaint  Patient presents with  . Dizziness      HPI  Patient presents with multiple complaints. Patient gives a rambling history of what is wrong, but it seems as though over the past few days, patient has had dizziness, lightheadedness, fatigue. Patient initially denies pain anywhere, but over the course of her evaluation describes occasional pain in her head, neck, shoulders, low back, legs. Patient states that the biggest change in her condition today is lightheadedness. She denies chest pain, dyspnea initially. She denies exertional or pleuritic change in condition. She states that when she moves her head she has increased dizziness. Patient has  History of vertigo, states that these symptoms are similar to those she has experienced with vertigo in the past. She denies confusion, disorientation, syncope  Past Medical History  Diagnosis Date  . COPD (chronic obstructive pulmonary disease) with chronic bronchitis     & Emphysema  . Acute MI     x3  . Asthma   . Barrett esophagus 2007  . Hiatal hernia [id number]  . Duodenitis without mention of hemorrhage 2007  . Esophageal stricture   . Esophageal reflux 2007  . Anxiety   . Depression   . Multiple fractures     from falls, fx rt. elbow, fx left wrist, bilateral ankles  . Allergy   . Ulcer   . H/O hiatal hernia    Past Surgical History  Procedure Laterality Date  . Abdominal hysterectomy    . Neck surgery    . Cholecystectomy    . Elbow fracture surgery      right  . Wrist fracture surgery      left, has plate  . Colonoscopy    . Nm myoview ltd  07/02/2013    Normal LV function, EF 83%. (Likely overestimated due to low volume);; no evidence of ischemia or infarction. Normal wall motion.  . Lower extremity arterial dopplers  07/06/2013    RABI 1.0, LABI 1.1.; No evidence of  significant vascular atherogenic plaque   Family History  Problem Relation Age of Onset  . Emphysema Father   . Colon cancer Neg Hx   . Diabetes Maternal Grandmother   . Diabetes Maternal Aunt   . Diabetes      mat. cousin  . Heart disease Brother   . Dementia Mother    History  Substance Use Topics  . Smoking status: Current Every Day Smoker -- 0.50 packs/day for 21 years    Types: Cigarettes  . Smokeless tobacco: Never Used     Comment: tobaccco info given 03/06/12  . Alcohol Use: No   OB History    No data available     Review of Systems  Constitutional:       Per HPI, otherwise negative  HENT:       Per HPI, otherwise negative  Respiratory:       Per HPI, otherwise negative  Cardiovascular:       Per HPI, otherwise negative  Gastrointestinal: Negative for vomiting.  Endocrine:       Negative aside from HPI  Genitourinary:       Neg aside from HPI   Musculoskeletal:       Per HPI, otherwise negative  Skin: Negative.   Neurological: Positive for dizziness, weakness and light-headedness. Negative for syncope, facial asymmetry and  numbness.      Allergies  Bee venom; Codeine; Ibuprofen; Propoxyphene n-acetaminophen; Toradol; Hydrocodone-acetaminophen; and Latex  Home Medications   Prior to Admission medications   Medication Sig Start Date End Date Taking? Authorizing Provider  ALPRAZolam Duanne Moron) 1 MG tablet Take 1 mg by mouth as needed for anxiety (takes only once per day prn).  09/05/13   Historical Provider, MD  cephALEXin (KEFLEX) 500 MG capsule Take 1 capsule (500 mg total) by mouth 3 (three) times daily. 01/23/14   Harden Mo, MD  dexlansoprazole (DEXILANT) 60 MG capsule Take 60 mg by mouth daily.    Historical Provider, MD  ketoconazole (NIZORAL) 200 MG tablet Take 1 tablet (200 mg total) by mouth daily. 01/23/14   Harden Mo, MD  mometasone-formoterol [facility]) 200-5 MCG/ACT AERO Inhale 2 puffs into the lungs 2 (two) times daily.    Historical  Provider, MD  montelukast (SINGULAIR) 10 MG tablet Take 10 mg by mouth at bedtime.    Historical Provider, MD  nitroGLYCERIN (NITROSTAT) 0.4 MG SL tablet Place 0.4 mg under the tongue every 5 (five) minutes as needed for chest pain.    Historical Provider, MD  omeprazole (PRILOSEC) 20 MG capsule Take 20 mg by mouth daily. 10/28/13   Historical Provider, MD  ondansetron (ZOFRAN) 4 MG tablet Take 4 mg by mouth every 6 (six) hours as needed. For nausea 10/18/13   Historical Provider, MD  predniSONE (STERAPRED UNI-PAK) 10 MG tablet Take 6 tabs by mouth daily  for 2 days, then 5 tabs for 2 days, then 4 tabs for 2 days, then 3 tabs for 2 days, 2 tabs for 2 days, then 1 tab by mouth daily for 2 days 01/16/14   Dorie Rank, MD  promethazine-dextromethorphan (PROMETHAZINE-DM) 6.25-15 MG/5ML syrup Take 5 mLs by mouth 4 (four) times daily as needed for cough.    Historical Provider, MD   BP 125/70 mmHg  Pulse 69  Temp(Src) 98.9 F (37.2 C) (Oral)  Resp 13  SpO2 100% Physical Exam  Constitutional: She is oriented to person, place, and time. She appears well-developed and well-nourished. No distress.  HENT:  Head: Normocephalic and atraumatic.  Eyes: Conjunctivae and EOM are normal.  Cardiovascular: Normal rate and regular rhythm.   Pulmonary/Chest: Effort normal. No accessory muscle usage or stridor. No respiratory distress. She has decreased breath sounds. She has no wheezes.  Abdominal: She exhibits no distension.  Musculoskeletal: She exhibits no edema.  Neurological: She is alert and oriented to person, place, and time. She displays no atrophy and no tremor. No cranial nerve deficit or sensory deficit. She exhibits normal muscle tone. She displays no seizure activity. Coordination normal.  Gait is slow, but not antalgic. Patient has history of chronic pain in the back, legs.  Dizziness provoked with head rotation   Skin: Skin is warm and dry.  Psychiatric: She has a normal mood and affect.  Nursing  note and vitals reviewed.   ED Course  Procedures (including critical care time) Labs Review Labs Reviewed  CBC WITH DIFFERENTIAL - Abnormal; Notable for the following:    RDW 15.8 (*)    All other components within normal limits  COMPREHENSIVE METABOLIC PANEL - Abnormal; Notable for the following:    Alkaline Phosphatase 130 (*)    Total Bilirubin <0.2 (*)    All other components within normal limits  URINALYSIS, ROUTINE W REFLEX MICROSCOPIC - Abnormal; Notable for the following:    Hgb urine dipstick TRACE (*)  All other components within normal limits  URINE MICROSCOPIC-ADD ON  I-STAT CG4 LACTIC ACID, ED    Imaging Review No results found.   EKG Interpretation None     Pulse ox was 97% room air normal   Update: patient is ambulatory, walking to the bathroom, no foot drop, no antalgia. She requests a steroid injection for her chronic back and lower leg pain  Update: patient in no distress.  She is aware of all results.  The patient's labs, vital signs are reassuring, the patient requests admission. Inform the patient that there is no indication for admission given her reassuring findings. Patient will receive medication for her chronic pain, and with consideration of multifactorial etiology for her presentation she will receive prednisone. She will f/u w PMD.  MDM   Patient presents with a variety of complaints, but seemingly is most concerned about new dizziness.  Patient has a history of vertigo.  On evaluation the patient is neurologically intact, hemodynamically stable.  Patient has symptoms that are worse with head rotation.  Patient is ambulatory, awake, alert, in no distress, there is low suspicion for neurologic compromise, occult infection, or other acute pathology.  Patient received multiple medications, was discharged to follow up with primary care.    Carmin Muskrat, MD 07/20/14 [phone]

## 2014-07-20 NOTE — ED Notes (Signed)
Pt presents via with c/o dizziness. Pt has a hx of vertigo and takes medicine for same but has not taken her medication in the past few days. Pt woke up feeling dizzy this morning so she took one of her meclizine pills when she got up and it did not resolve her symptoms so she called EMS. Pt also c/o weakness.

## 2014-09-06 ENCOUNTER — Encounter: Payer: Self-pay | Admitting: Cardiology

## 2014-09-06 ENCOUNTER — Ambulatory Visit (INDEPENDENT_AMBULATORY_CARE_PROVIDER_SITE_OTHER): Payer: PRIVATE HEALTH INSURANCE | Admitting: Cardiology

## 2014-09-06 VITALS — BP 110/84 | HR 82 | Ht 64.0 in | Wt 132.3 lb

## 2014-09-06 DIAGNOSIS — I252 Old myocardial infarction: Secondary | ICD-10-CM | POA: Insufficient documentation

## 2014-09-06 DIAGNOSIS — R079 Chest pain, unspecified: Secondary | ICD-10-CM

## 2014-09-06 DIAGNOSIS — Z720 Tobacco use: Secondary | ICD-10-CM

## 2014-09-06 DIAGNOSIS — F17200 Nicotine dependence, unspecified, uncomplicated: Secondary | ICD-10-CM

## 2014-09-06 DIAGNOSIS — F419 Anxiety disorder, unspecified: Secondary | ICD-10-CM

## 2014-09-06 DIAGNOSIS — F411 Generalized anxiety disorder: Secondary | ICD-10-CM | POA: Insufficient documentation

## 2014-09-06 DIAGNOSIS — J40 Bronchitis, not specified as acute or chronic: Secondary | ICD-10-CM

## 2014-09-06 NOTE — Assessment & Plan Note (Signed)
Pt complains of Lt sided chest pain associated with increased dyspnea on exertion and Lt arm pain. Seen in the ED 07/20/14

## 2014-09-06 NOTE — Assessment & Plan Note (Signed)
Current smoker 

## 2014-09-06 NOTE — Patient Instructions (Signed)
Your physician has requested that you have a lexiscan myoview. For further information please visit [email or web address]. Please follow instruction sheet, as given.   Your physician recommends that you schedule a follow-up appointment in: [place]

## 2014-09-06 NOTE — Progress Notes (Signed)
09/06/2014 Lori [name]   08-Feb-1956  [id number]  Primary Physician No primary care provider on file. Primary Cardiologist: Dr Percival Spanish saw her in 2012 in consult  HPI:  58 y/o female referred to [place] by the ED. The pt has a history of a remote MI but no details are available. She had a low risk Myoview in 2010. She saw Dr Percival Spanish in consult at the hospital in 2012. Recently she had been treated as an OP for "bronchitis". She went to the ER because she was having Lt chest "drawing" pain and dyspnea. She tells me that the antibiotics didn't help. She is a rambling historian and its difficult to get clear responses.    Current Outpatient Prescriptions  Medication Sig Dispense Refill  . aspirin 81 MG tablet Take 81 mg by mouth daily.    . butalbital-acetaminophen-caffeine (FIORICET, ESGIC) 50-325-40 MG per tablet Take 1 tablet by mouth 2 (two) times daily as needed for headache.    . cephALEXin (KEFLEX) 500 MG capsule Take 1 capsule (500 mg total) by mouth 3 (three) times daily. 30 capsule 0  . cyclobenzaprine (FLEXERIL) 10 MG tablet Take 10 mg by mouth 2 (two) times daily as needed for muscle spasms.    Marland Kitchen dexlansoprazole (DEXILANT) 60 MG capsule Take 60 mg by mouth daily.    Marland Kitchen docusate sodium (COLACE) 100 MG capsule Take 100 mg by mouth 2 (two) times daily.    Marland Kitchen ibuprofen (ADVIL,MOTRIN) 800 MG tablet Take 800 mg by mouth 3 (three) times daily as needed for mild pain or moderate pain.    Marland Kitchen ketoconazole (NIZORAL) 200 MG tablet Take 1 tablet (200 mg total) by mouth daily. 28 tablet 0  . LORazepam (ATIVAN) 1 MG tablet Take 1 tablet (1 mg total) by mouth 3 (three) times daily as needed (dizziness / nausea). 15 tablet 0  . mometasone-formoterol (DULERA) 200-5 MCG/ACT AERO Inhale 2 puffs into the lungs 2 (two) times daily.    . montelukast (SINGULAIR) 10 MG tablet Take 10 mg by mouth at bedtime.    . nitroGLYCERIN (NITROSTAT) 0.4 MG SL tablet Place 0.4 mg under the tongue every 5 (five) minutes  as needed for chest pain.    Marland Kitchen omeprazole (PRILOSEC) 20 MG capsule Take 20 mg by mouth daily.    . ondansetron (ZOFRAN) 4 MG tablet Take 4 mg by mouth every 6 (six) hours as needed. For nausea    . oxybutynin (DITROPAN) 5 MG tablet Take 5 mg by mouth 2 (two) times daily.    . promethazine-dextromethorphan (PROMETHAZINE-DM) 6.25-15 MG/5ML syrup Take 5 mLs by mouth 4 (four) times daily as needed for cough.    . ranitidine (ZANTAC) 150 MG tablet Take 150 mg by mouth 2 (two) times daily.    . traMADol (ULTRAM) 50 MG tablet Take 50 mg by mouth every 8 (eight) hours as needed.    . triamcinolone cream (KENALOG) 0.1 % Apply 1 application topically 2 (two) times daily.    . valACYclovir (VALTREX) 500 MG tablet Take 500 mg by mouth daily.    Marland Kitchen ALPRAZolam (XANAX) 1 MG tablet Take 1 tablet by mouth as needed.  2  . alprazolam (XANAX) 2 MG tablet Take 1 tablet by mouth as needed.  0  . amoxicillin-clavulanate (AUGMENTIN) 875-125 MG per tablet Take 1 tablet by mouth 2 (two) times daily.    Marland Kitchen dicyclomine (BENTYL) 20 MG tablet Take 1 tablet by mouth daily.  3  . diphenoxylate-atropine (LOMOTIL) 2.5-0.025 MG  per tablet Take 1 tablet by mouth every 4 (four) hours as needed.    . hydrOXYzine (ATARAX/VISTARIL) 25 MG tablet Take 1 tablet by mouth as needed.  3  . PROAIR HFA 108 (90 BASE) MCG/ACT inhaler Inhale 1 mcg into the lungs 2 (two) times daily.  4  . promethazine (PHENERGAN) 25 MG tablet Take 1 tablet by mouth as needed.  0  . promethazine-codeine (PHENERGAN WITH CODEINE) 6.25-10 MG/5ML syrup Take 5 mLs by mouth 2 (two) times daily.  0   No current facility-administered medications for this visit.    Allergies  Allergen Reactions  . Bee Venom Swelling  . Codeine Nausea Only    CAUSES ULCERS  . Ibuprofen Nausea And Vomiting  . Propoxyphene N-Acetaminophen Nausea Only  . Toradol [Ketorolac Tromethamine] Nausea And Vomiting  . Hydrocodone-Acetaminophen Nausea Only    REACTION: "sick"  . Latex Rash     History   Social History  . Marital Status: Married    Spouse Name: [name]    Number of Children: 2  . Years of Education: HS   Occupational History  . disable    Social History Main Topics  . Smoking status: Current Every Day Smoker -- 0.50 packs/day for 21 years    Types: Cigarettes  . Smokeless tobacco: Never Used     Comment: tobaccco info given 03/06/12  . Alcohol Use: No  . Drug Use: No  . Sexual Activity: Not on file   Other Topics Concern  . Not on file   Social History Narrative   Patient lives at home spouse - [name].  Has 2 daughters (39 & 14).   Currently "disabled".   Caffeine Use: tea 3-4 cups daily. No Alcohol   Smokes ~1 1/2 PPD     Walks everyday - no set routine.     Review of Systems: General: negative for chills, fever, night sweats or weight changes.  Cardiovascular: negative for chest pain, dyspnea on exertion, edema, orthopnea, palpitations, paroxysmal nocturnal dyspnea or shortness of breath Dermatological: negative for rash Respiratory: negative for cough or wheezing Urologic: negative for hematuria Abdominal: negative for nausea, vomiting, diarrhea, bright red blood per rectum, melena, or hematemesis Neurologic: negative for visual changes, syncope, or dizziness All other systems reviewed and are otherwise negative except as noted above.    Blood pressure 110/84, pulse 82, height 5\' 4"  (1.626 m), weight 132 lb 4.8 oz (60.011 kg).  General appearance: alert, cooperative and no distress Neck: no carotid bruit and no JVD Lungs: decreased breath sounds Heart: regular rate and rhythm  EKG NSR  ASSESSMENT AND PLAN:   Chest pain with moderate risk of acute coronary syndrome Pt complains of Lt sided chest pain associated with increased dyspnea on exertion and Lt arm pain. Seen in the ED 07/20/14  Bronchitis On ABs  Smoking Current smoker  Anxiety On multiple medications  History of MI This is per the pt. I could find no records of  previous MI or cath. She had a low risk Myoview in 2010   PLAN  I have ordered a Lexiscan Myoview, she'll follow up after this.   [facility] 09/06/2014 12:37 PM

## 2014-09-06 NOTE — Assessment & Plan Note (Signed)
On ABs

## 2014-09-06 NOTE — Assessment & Plan Note (Signed)
This is per the pt. I could find no records of previous MI or cath. She had a low risk Myoview in 2010

## 2014-09-06 NOTE — Assessment & Plan Note (Signed)
On multiple medications 

## 2014-09-16 HISTORY — PX: NM MYOVIEW LTD: HXRAD82

## 2014-09-23 ENCOUNTER — Telehealth (HOSPITAL_COMMUNITY): Payer: Self-pay

## 2014-09-23 NOTE — Telephone Encounter (Signed)
Encounter complete. 

## 2014-09-28 ENCOUNTER — Ambulatory Visit (HOSPITAL_COMMUNITY)
Admission: RE | Admit: 2014-09-28 | Discharge: 2014-09-28 | Disposition: A | Discharge status: Home / Self Care | Payer: Medicare Other | Source: Ambulatory Visit | Attending: Internal Medicine | Admitting: Internal Medicine

## 2014-09-28 DIAGNOSIS — R0609 Other forms of dyspnea: Secondary | ICD-10-CM | POA: Insufficient documentation

## 2014-09-28 DIAGNOSIS — E785 Hyperlipidemia, unspecified: Secondary | ICD-10-CM | POA: Diagnosis not present

## 2014-09-28 DIAGNOSIS — R42 Dizziness and giddiness: Secondary | ICD-10-CM | POA: Diagnosis not present

## 2014-09-28 DIAGNOSIS — R110 Nausea: Secondary | ICD-10-CM | POA: Diagnosis not present

## 2014-09-28 DIAGNOSIS — R002 Palpitations: Secondary | ICD-10-CM | POA: Diagnosis not present

## 2014-09-28 DIAGNOSIS — R5383 Other fatigue: Secondary | ICD-10-CM | POA: Diagnosis not present

## 2014-09-28 DIAGNOSIS — Z8249 Family history of ischemic heart disease and other diseases of the circulatory system: Secondary | ICD-10-CM | POA: Diagnosis not present

## 2014-09-28 DIAGNOSIS — R079 Chest pain, unspecified: Secondary | ICD-10-CM

## 2014-09-28 DIAGNOSIS — I10 Essential (primary) hypertension: Secondary | ICD-10-CM | POA: Diagnosis not present

## 2014-09-28 MED ORDER — TECHNETIUM TC 99M SESTAMIBI GENERIC - CARDIOLITE
30.9000 | Freq: Once | INTRAVENOUS | Status: AC | PRN
  Administered 2014-09-28: 30.9 via INTRAVENOUS

## 2014-09-28 MED ORDER — AMINOPHYLLINE 25 MG/ML IV SOLN
125.0000 mg | Freq: Once | INTRAVENOUS | Status: AC
  Administered 2014-09-28: 125 mg via INTRAVENOUS

## 2014-09-28 MED ORDER — TECHNETIUM TC 99M SESTAMIBI GENERIC - CARDIOLITE
10.4000 | Freq: Once | INTRAVENOUS | Status: AC | PRN
  Administered 2014-09-28: 10 via INTRAVENOUS

## 2014-09-28 MED ORDER — REGADENOSON 0.4 MG/5ML IV SOLN
0.4000 mg | Freq: Once | INTRAVENOUS | Status: AC
  Administered 2014-09-28: 0.4 mg via INTRAVENOUS

## 2014-09-28 NOTE — Procedures (Addendum)
[name] [address] [place] [address] [phone]  Cardiology Nuclear Med Study  Lori [name] is a 59 y.o. female     MRN : [id number]     DOB: May 13, 1956  Procedure Date: 09/28/2014  Nuclear Med Background Indication for Stress Test:  Evaluation for Ischemia and Post Hospital History:  Asthma, COPD, Emphysema and MI X3;Last NUC MPI on 08/02/2013-normal;EF=83% Cardiac Risk Factors: Family History - CAD, Hypertension, Lipids, Smoker and Bilateral leg pain  Symptoms:  Chest Pain, Dizziness, DOE, Fatigue, Light-Headedness, Nausea, Palpitations and Vomiting   Nuclear Pre-Procedure Caffeine/Decaff Intake:  7:00pm NPO After: 5:00am   IV Site: R Forearm  IV 0.9% NS with Angio Cath:  22g  Chest Size (in):  n/a IV Started by: Rolene Course, RN  Height: 5\' 4"  (1.626 m)  Cup Size: D  BMI:  Body mass index is 22.65 kg/(m^2). Weight:  132 lb (59.875 kg)   Tech Comments:  n/a    Nuclear Med Study 1 or 2 day study: 1 day  Stress Test Type:  [place] Provider:  Glenetta Hew, MD   Resting Radionuclide: Technetium 58m Sestamibi  Resting Radionuclide Dose: 10.4 mCi   Stress Radionuclide:  Technetium 100m Sestamibi  Stress Radionuclide Dose: 30.9 mCi           Stress Protocol Rest HR: 74 Stress HR: 103  Rest BP: 109/79 Stress BP:143/60  Exercise Time (min): n/a METS: n/a          Dose of Adenosine (mg):  n/a Dose of Lexiscan: 0.4 mg  Dose of Atropine (mg): n/a Dose of Dobutamine: n/a mcg/kg/min (at max HR)  Stress Test Technologist: Mellody Memos, CCT Nuclear Technologist: Imagene Riches, CNMT   Rest Procedure:  Myocardial perfusion imaging was performed at rest 45 minutes following the intravenous administration of Technetium 102m Sestamibi. Stress Procedure:  The patient received IV Lexiscan 0.4 mg over 15-seconds.  Technetium 19m Sestamibi injected IV at 30-seconds. Patient experienced shortness of  breath and was administered 75 mg of Aminophylline IV.  There were no significant changes with Lexiscan.  Quantitative spect images were obtained after a 45 minute delay.  Transient Ischemic Dilatation (Normal <1.22):  1.37  QGS EDV:  54 ml QGS ESV:  18 ml LV Ejection Fraction: 66%  Rest ECG: NSR - Normal EKG  Stress ECG: No significant change from baseline ECG  QPS Raw Data Images:  Normal; no motion artifact; normal heart/lung ratio. Stress Images:  Normal homogeneous uptake in all areas of the myocardium. Rest Images:  Normal homogeneous uptake in all areas of the myocardium. Subtraction (SDS):  No evidence of ischemia.  Impression Exercise Capacity:  Lexiscan with no exercise. BP Response:  Normal blood pressure response. Clinical Symptoms:  No significant symptoms noted. ECG Impression:  No significant ECG changes with Lexiscan. Comparison with Prior Nuclear Study: No previous nuclear study performed  Overall Impression:  Normal stress nuclear study. TID is abnormal at 1.37.  LV Wall Motion:  NL LV Function; NL Wall Motion; LVEF 66%.  Pixie Casino, MD, [facility] Board Certified in Nuclear Cardiology Attending Cardiologist [place], MD  09/28/2014 12:41 PM

## 2014-09-29 NOTE — Progress Notes (Signed)
Quick Note:  Stress Test looked good!! No sign of significant Heart Artery Disease. Pump function is normal.  Good news!!.  Kimberlly Norgard W, MD  ______ 

## 2014-10-04 ENCOUNTER — Encounter: Payer: Self-pay | Admitting: *Deleted

## 2014-10-13 ENCOUNTER — Ambulatory Visit (INDEPENDENT_AMBULATORY_CARE_PROVIDER_SITE_OTHER): Payer: Medicare Other | Admitting: Cardiology

## 2014-10-13 ENCOUNTER — Encounter: Payer: Self-pay | Admitting: Cardiology

## 2014-10-13 ENCOUNTER — Other Ambulatory Visit (HOSPITAL_COMMUNITY): Payer: Self-pay | Admitting: Orthopaedic Surgery

## 2014-10-13 VITALS — BP 121/73 | HR 70 | Ht 64.0 in | Wt 131.0 lb

## 2014-10-13 DIAGNOSIS — R0789 Other chest pain: Secondary | ICD-10-CM

## 2014-10-13 DIAGNOSIS — M25562 Pain in left knee: Secondary | ICD-10-CM

## 2014-10-13 DIAGNOSIS — F17200 Nicotine dependence, unspecified, uncomplicated: Secondary | ICD-10-CM

## 2014-10-13 DIAGNOSIS — J40 Bronchitis, not specified as acute or chronic: Secondary | ICD-10-CM

## 2014-10-13 DIAGNOSIS — F419 Anxiety disorder, unspecified: Secondary | ICD-10-CM

## 2014-10-13 DIAGNOSIS — Z720 Tobacco use: Secondary | ICD-10-CM

## 2014-10-13 NOTE — Progress Notes (Signed)
10/13/2014 Lori [name]   1956/02/28  [id number]  Primary Physician No primary care provider on file. Primary Cardiologist: Dr Ellyn Hack  HPI:  59 y/o seen by [place] in the past. There has been a questionable history of CAD/ MI. There are no records to support this. She had a normal echo and Myoview in 2010. I recently saw her in follow up for chest pain in the setting of bronchitis. A Myoview done 09/28/14 was normal. She is here for follow up.            She is a rambling historian. She injured her Lt knee and is seeing Dr Lorin Mercy. She is wearing a knee brace. Her bronchitis is resolving. She has complaints of GERD and localized chest pain but nothing that sounds like angina.   Current Outpatient Prescriptions  Medication Sig Dispense Refill  . ALPRAZolam (XANAX) 1 MG tablet Take 1 tablet by mouth as needed.  2  . alprazolam (XANAX) 2 MG tablet Take 1 tablet by mouth as needed.  0  . amoxicillin-clavulanate (AUGMENTIN) 875-125 MG per tablet Take 1 tablet by mouth 2 (two) times daily.    Marland Kitchen aspirin 81 MG tablet Take 81 mg by mouth daily.    . butalbital-acetaminophen-caffeine (FIORICET, ESGIC) 50-325-40 MG per tablet Take 1 tablet by mouth 2 (two) times daily as needed for headache.    . cyclobenzaprine (FLEXERIL) 10 MG tablet Take 10 mg by mouth 2 (two) times daily as needed for muscle spasms.    Marland Kitchen dexlansoprazole (DEXILANT) 60 MG capsule Take 60 mg by mouth daily.    Marland Kitchen dicyclomine (BENTYL) 20 MG tablet Take 1 tablet by mouth daily.  3  . diphenoxylate-atropine (LOMOTIL) 2.5-0.025 MG per tablet Take 1 tablet by mouth every 4 (four) hours as needed.    . docusate sodium (COLACE) 100 MG capsule Take 100 mg by mouth 2 (two) times daily.    . hydrOXYzine (ATARAX/VISTARIL) 25 MG tablet Take 1 tablet by mouth as needed.  3  . ibuprofen (ADVIL,MOTRIN) 800 MG tablet Take 800 mg by mouth 3 (three) times daily as needed for mild pain or moderate pain.    Marland Kitchen ketoconazole (NIZORAL) 200 MG tablet Take 1  tablet (200 mg total) by mouth daily. 28 tablet 0  . loperamide (IMODIUM) 2 MG capsule Take 1 capsule by mouth daily.  0  . LORazepam (ATIVAN) 1 MG tablet Take 1 tablet (1 mg total) by mouth 3 (three) times daily as needed (dizziness / nausea). 15 tablet 0  . meclizine (ANTIVERT) 25 MG tablet Take 1 tablet by mouth daily.  2  . mometasone-formoterol (DULERA) 200-5 MCG/ACT AERO Inhale 2 puffs into the lungs 2 (two) times daily.    . montelukast (SINGULAIR) 10 MG tablet Take 10 mg by mouth at bedtime.    . nitroGLYCERIN (NITROSTAT) 0.4 MG SL tablet Place 0.4 mg under the tongue every 5 (five) minutes as needed for chest pain.    Marland Kitchen omeprazole (PRILOSEC) 20 MG capsule Take 20 mg by mouth daily.    . ondansetron (ZOFRAN) 4 MG tablet Take 4 mg by mouth every 6 (six) hours as needed. For nausea    . oxybutynin (DITROPAN) 5 MG tablet Take 5 mg by mouth 2 (two) times daily.    Marland Kitchen PROAIR HFA 108 (90 BASE) MCG/ACT inhaler Inhale 1 mcg into the lungs 2 (two) times daily.  4  . promethazine (PHENERGAN) 25 MG tablet Take 1 tablet by mouth as needed.  0  . promethazine-dextromethorphan (PROMETHAZINE-DM) 6.25-15 MG/5ML syrup Take 5 mLs by mouth 4 (four) times daily as needed for cough.    . ranitidine (ZANTAC) 150 MG tablet Take 150 mg by mouth 2 (two) times daily.    . traMADol (ULTRAM) 50 MG tablet Take 50 mg by mouth every 8 (eight) hours as needed.    . triamcinolone cream (KENALOG) 0.1 % Apply 1 application topically 2 (two) times daily.    . valACYclovir (VALTREX) 500 MG tablet Take 500 mg by mouth daily.     No current facility-administered medications for this visit.    Allergies  Allergen Reactions  . Bee Venom Swelling  . Codeine Nausea Only    CAUSES ULCERS  . Ibuprofen Nausea And Vomiting  . Propoxyphene N-Acetaminophen Nausea Only  . Toradol [Ketorolac Tromethamine] Nausea And Vomiting  . Hydrocodone-Acetaminophen Nausea Only    REACTION: "sick"  . Latex Rash    History   Social  History  . Marital Status: Married    Spouse Name: [name]    Number of Children: 2  . Years of Education: HS   Occupational History  . disable    Social History Main Topics  . Smoking status: Current Every Day Smoker -- 0.50 packs/day for 21 years    Types: Cigarettes  . Smokeless tobacco: Never Used     Comment: tobaccco info given 03/06/12  . Alcohol Use: No  . Drug Use: No  . Sexual Activity: Not on file   Other Topics Concern  . Not on file   Social History Narrative   Patient lives at home spouse - [name].  Has 2 daughters (27 & 82).   Currently "disabled".   Caffeine Use: tea 3-4 cups daily. No Alcohol   Smokes ~1 1/2 PPD     Walks everyday - no set routine.     Review of Systems: General: negative for chills, fever, night sweats or weight changes.  Cardiovascular: negative for chest pain, dyspnea on exertion, edema, orthopnea, palpitations, paroxysmal nocturnal dyspnea or shortness of breath Dermatological: negative for rash Respiratory: negative for cough or wheezing Urologic: negative for hematuria Abdominal: negative for nausea, vomiting, diarrhea, bright red blood per rectum, melena, or hematemesis Neurologic: negative for visual changes, syncope, or dizziness No hemoptysis, no unusual DOE (she has emphysema) All other systems reviewed and are otherwise negative except as noted above.    Blood pressure 121/73, pulse 70, height 5\' 4"  (1.626 m), weight 131 lb (59.421 kg).  General appearance: alert, cooperative and no distress Lungs: clear to auscultation bilaterally Heart: regular rate and rhythm Extremities: no calf pain or swelling, knee brace on Lt leg    ASSESSMENT AND PLAN:   Bronchitis On ABs  Smoking Current smoker  Anxiety On multiple medications  History of MI This is per the pt. I could find no records of previous MI or cath. She had a low risk Myoview in 2010 and 09/28/14  GERD On Dexilant   PLAN  I consider the possibility of a PE  but she is not tachycardic ands her chest pain migrates and is not pleuritic. There is no history of hemoptysis or calf pain.  She can f/u with Dr Ellyn Hack in one year.  Sherin Murdoch KPA-C 10/13/2014 10:26 AM

## 2014-10-13 NOTE — Patient Instructions (Signed)
Your physician wants you to follow-up in: 1 Year. You will receive a reminder letter in the mail two months in advance. If you don't receive a letter, please call our office to schedule the follow-up appointment.  

## 2014-10-24 ENCOUNTER — Ambulatory Visit (HOSPITAL_COMMUNITY)
Admission: RE | Admit: 2014-10-24 | Discharge: 2014-10-24 | Disposition: A | Discharge status: Home / Self Care | Payer: Medicare Other | Source: Ambulatory Visit | Attending: Orthopaedic Surgery | Admitting: Orthopaedic Surgery

## 2014-10-24 DIAGNOSIS — M25562 Pain in left knee: Secondary | ICD-10-CM

## 2014-10-24 DIAGNOSIS — W19XXXA Unspecified fall, initial encounter: Secondary | ICD-10-CM | POA: Insufficient documentation

## 2014-10-24 DIAGNOSIS — M948X6 Other specified disorders of cartilage, lower leg: Secondary | ICD-10-CM | POA: Insufficient documentation

## 2014-10-30 IMAGING — CR DG CHEST 2V
2 series · 2 of 2 positions shown · non-contrast
Comparison: 05/17/2012

CLINICAL DATA: Vomiting, cough, diarrhea

CHEST - 2 VIEW

[w chest pa]
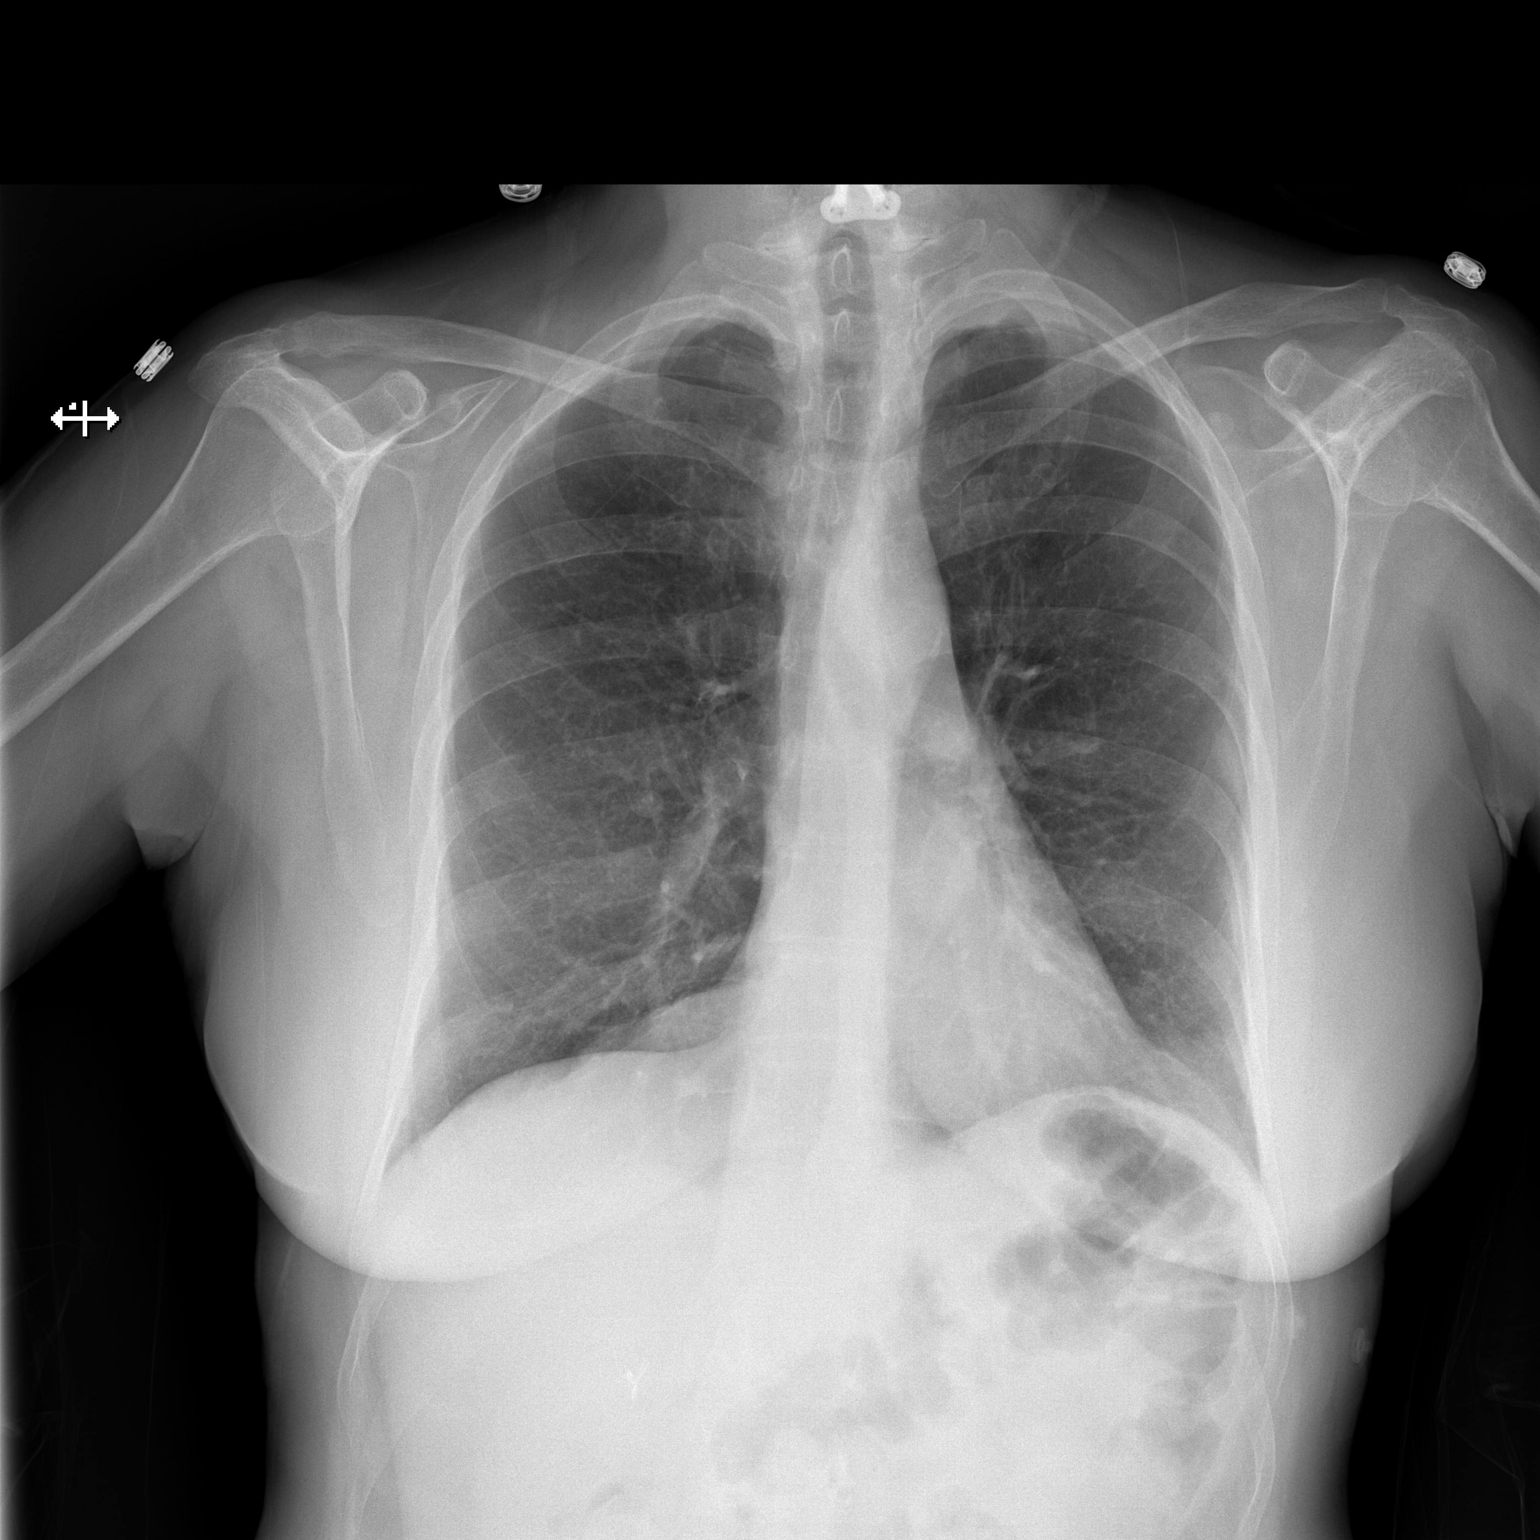

[w chest lat]
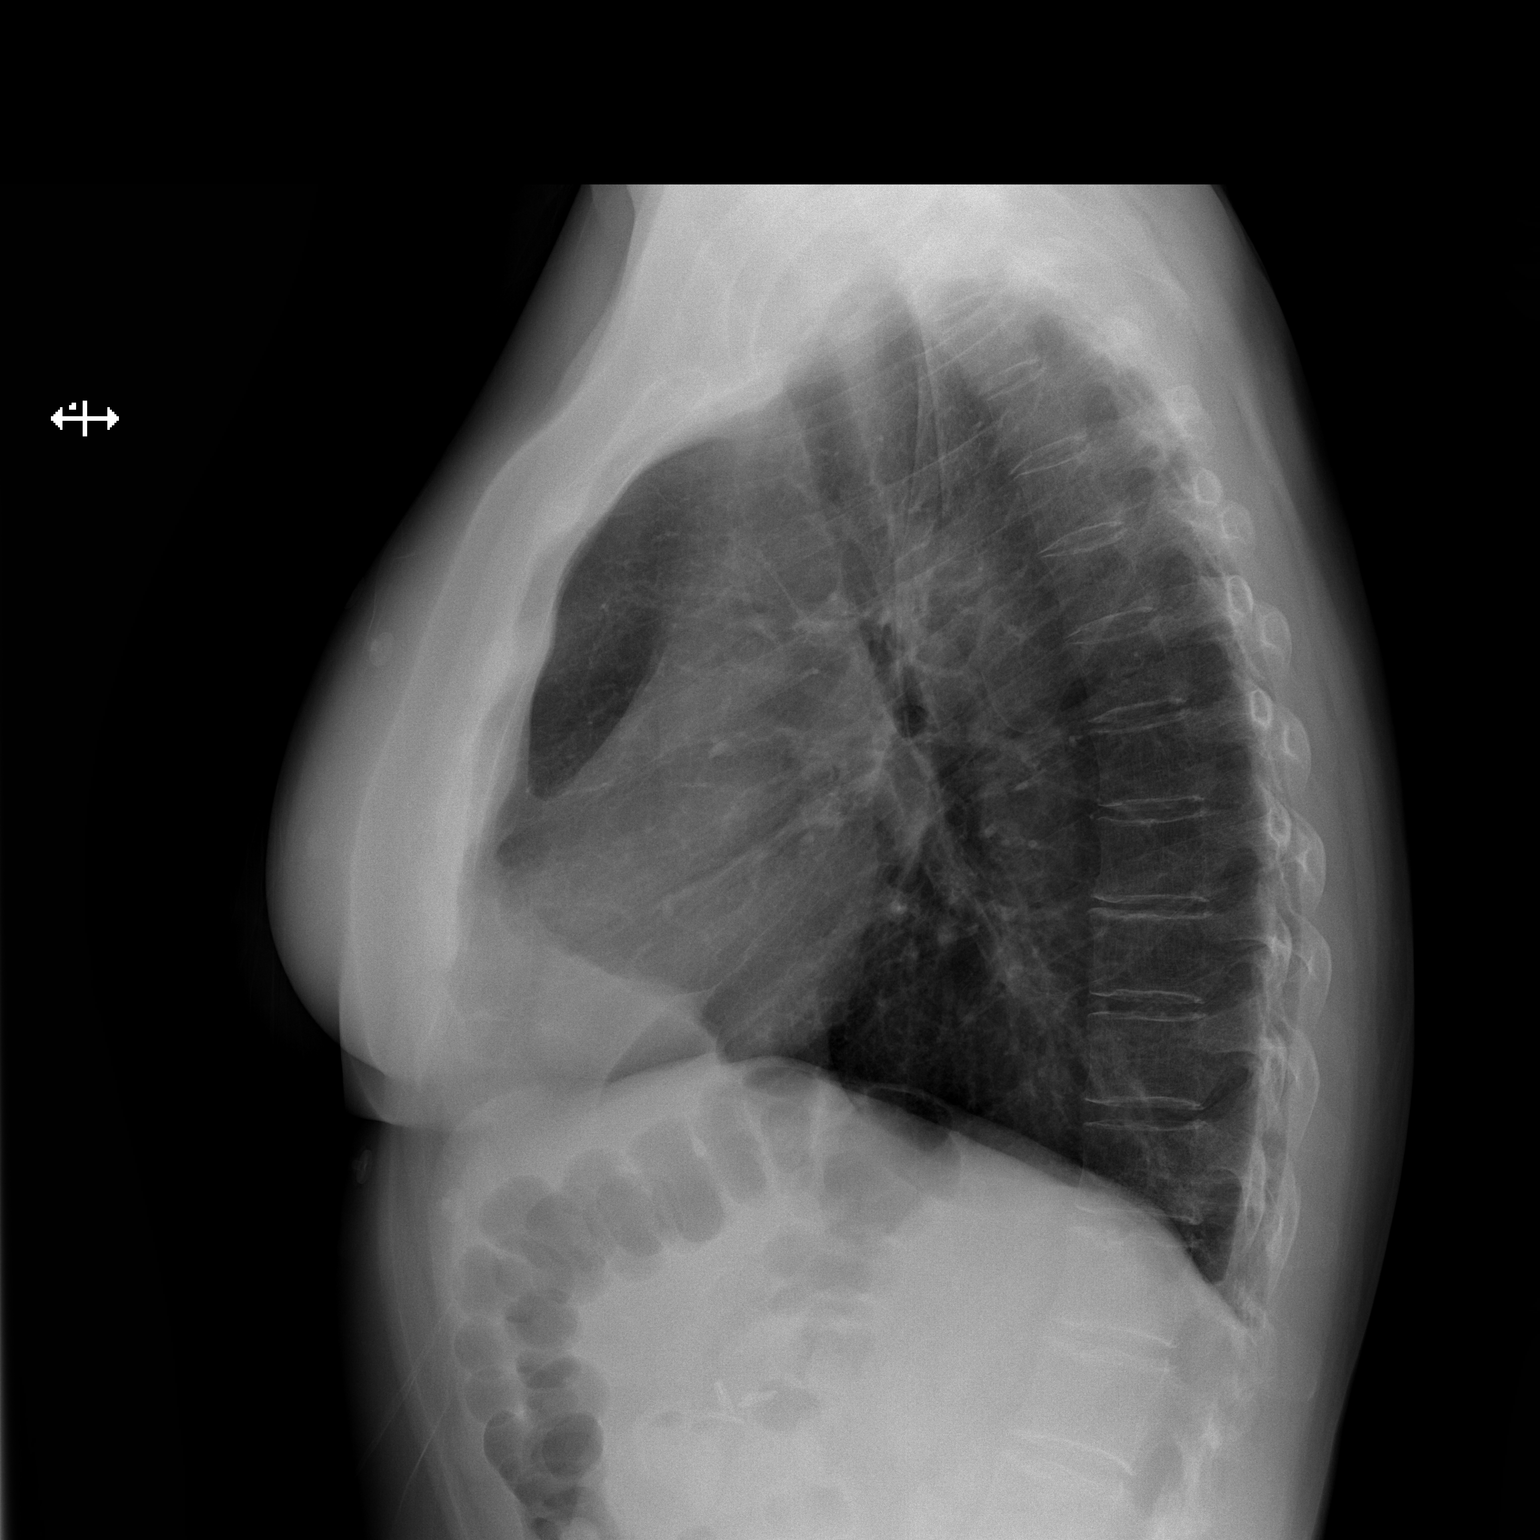

[2 of 2 positions shown; findings below may reference images not displayed]

FINDINGS: The cardiomediastinal silhouette is stable.
Hyperinflation again noted.  No acute infiltrate or pulmonary
edema.  Bony thorax is stable. Stable left basilar atelectasis.
IMPRESSION: No active disease.  Hyperinflation again noted.

## 2014-11-24 ENCOUNTER — Ambulatory Visit: Payer: Medicare Other | Attending: Orthopaedic Surgery

## 2014-11-24 DIAGNOSIS — M79662 Pain in left lower leg: Secondary | ICD-10-CM | POA: Diagnosis not present

## 2014-11-24 DIAGNOSIS — R262 Difficulty in walking, not elsewhere classified: Secondary | ICD-10-CM | POA: Insufficient documentation

## 2014-11-24 DIAGNOSIS — M25662 Stiffness of left knee, not elsewhere classified: Secondary | ICD-10-CM | POA: Diagnosis not present

## 2014-11-24 NOTE — Patient Instructions (Signed)
Hip Extension (Prone)   Lift left leg ____ inches from floor, keeping knee locked. Repeat ____ times per set. Do ____ sets per session. Do ____ sessions per day.  http://orth.exer.us/98   Copyright  VHI. All rights reserved.  Hip Adduction: Leg Lift (Eccentric) - Side-Lying   Lie on side with top leg bent, foot flat behind lower leg. Quickly lift lower leg. Slowly lower for 3-5 seconds. ___ reps per set, ___ sets per day, ___ days per week. Add ___ lbs when you achieve ___ repetitions.  Copyright  VHI. All rights reserved.  Abduction: Side Leg Lift (Eccentric) - Side-Lying   Lie on side. Lift top leg slightly higher than shoulder level. Keep top leg straight with body, toes pointing forward. Slowly lower for 3-5 seconds. ___ reps per set, ___ sets per day, ___ days per week. Add ___ lbs when you achieve ___ repetitions.  Copyright  VHI. All rights reserved.  Strengthening: Straight Leg Raise (Phase 1)   Tighten muscles on front of right thigh, then lift leg ____ inches from surface, keeping knee locked.  Repeat ____ times per set. Do ____ sets per session. Do ____ sessions per day.  http://orth.exer.us/614   Copyright  VHI. All rights reserved.  Marland Kitchenq

## 2014-11-24 NOTE — Therapy (Signed)
[place], [place], [address] Phone: [phone]   Fax:  [phone]  Physical Therapy Evaluation  Patient Details  Name: Lori [name] MRN: [id number] Date of Birth: [birth year] Referring Provider:  Marybelle Killings, MD  Encounter Date: 11/24/2014      PT End of Session - 11/24/14 1405    Visit Number 1   Number of Visits 10   Date for PT Re-Evaluation 01/05/15   PT Start Time 0125   PT Stop Time 0208   PT Time Calculation (min) 43 min   Activity Tolerance Patient tolerated treatment well   Behavior During Therapy [facility] for tasks assessed/performed      Past Medical History  Diagnosis Date  . COPD (chronic obstructive pulmonary disease) with chronic bronchitis     & Emphysema  . Acute MI     x3  . Asthma   . Barrett esophagus 2007  . Hiatal hernia [id number]  . Duodenitis without mention of hemorrhage 2007  . Esophageal stricture   . Esophageal reflux 2007  . Anxiety   . Depression   . Multiple fractures     from falls, fx rt. elbow, fx left wrist, bilateral ankles  . Allergy   . Ulcer   . H/O hiatal hernia   . Chest pain 05-02-2009    echo  EF 55%  . H/O cardiovascular stress test 05-02-2009    low risk scan, neg. for ischemia, no ekg changes    Past Surgical History  Procedure Laterality Date  . Abdominal hysterectomy    . Neck surgery    . Cholecystectomy    . Elbow fracture surgery      right  . Wrist fracture surgery      left, has plate  . Colonoscopy    . Nm myoview ltd  07/02/2013    Normal LV function, EF 83%. (Likely overestimated due to low volume);; no evidence of ischemia or infarction. Normal wall motion.  . Lower extremity arterial dopplers  07/06/2013    RABI 1.0, LABI 1.1.; No evidence of significant vascular atherogenic plaque    There were no vitals filed for this visit.  Visit Diagnosis:  Joint stiffness of left lower leg - Plan: PT plan of care  cert/re-cert  Pain of left lower leg - Plan: PT plan of care cert/re-cert  Difficulty walking - Plan: PT plan of care cert/re-cert      Subjective Assessment - 11/24/14 1330    Symptoms Lt knee pain. Trying Pt first then may have to re;place patella.    Pertinent History LT knee buckled and ja,mmed LT knee into heater. !11/11/2013. She has brace and injected  knee without benefit.    Limitations Sitting;Walking  getting but and down and into car and tub.    How long can you sit comfortably? Sits with LT knee decreased flexion   How long can you stand comfortably? 5 min   How long can you walk comfortably? 100 feet.    Diagnostic tests Xrays   Patient Stated Goals Stop pain.    Currently in Pain? Yes   Pain Score 4    Pain Location Knee   Pain Orientation Left   Pain Descriptors / Indicators Sharp   Pain Type --  sub acute   Pain Onset More than a month ago   Pain Frequency Constant   Aggravating Factors  See above   Pain Relieving Factors Nothing eases pain   Effect of  Pain on Daily Activities Limits all activity   Multiple Pain Sites No            [facility] PT Assessment - 11/24/14 1324    Assessment   Medical Diagnosis chondromalacia patella LT   Onset Date --  08/2014   Prior Therapy No   Restrictions   Weight Bearing Restrictions No   Balance Screen   Has the patient fallen in the past 6 months Yes   Prior Function   Level of Independence Independent with basic ADLs   Observation/Other Assessments   Focus on Therapeutic Outcomes (FOTO)  90%   AROM   AROM Assessment Site Knee   Right/Left Knee Left   Left Knee Extension -15  passive 180 degrees   Left Knee Flexion 105   Strength   Overall Strength Comments Not able to assess dut to pain    Palpation   Palpation Tender around patella    Ambulation/Gait   Ambulation/Gait Yes   Assistive device None   Gait Pattern Decreased hip/knee flexion - left                           PT Education  - 11/24/14 1347    Education provided Yes   Education Details POC   Person(s) Educated Patient   Methods Explanation;Tactile cues;Verbal cues;Handout   Comprehension Returned demonstration    HEP SLR, SAQ      PT Short Term Goals - 11/24/14 1400    PT SHORT TERM GOAL #1   Title Independnet with intial HEP    Time 3   Period Weeks   Status New   PT SHORT TERM GOAL #2   Title she will report pain decreased 30% with walking in and outof home.    Time 3   Period Weeks   Status New   PT SHORT TERM GOAL #3   Title increaed active Lt knee extension to -5 degrees to ease pain with wlaking   Time 3   Period Weeks   Status New   PT SHORT TERM GOAL #4   Title Active LT knee flexion top 115 degrees to ease pain with accessing car and tub   Time 3   Period Weeks   Status New   PT SHORT TERM GOAL #5   Title walk 300 feet or more with mild LT knee pain   Time 3   Period Weeks   Status New           PT Long Term Goals - 11/24/14 1356    PT LONG TERM GOAL #1   Title Independent with all HEP issued as of last visit   Time 6   Period Weeks   Status New   PT LONG TERM GOAL #2   Title Her pain will be reduced 50% or more with walking in community   Time 6   Period Weeks   Status New   PT LONG TERM GOAL #3   Title Her acttive range will be 0-125 degrees with mild pain   Time 6   Period Weeks   Status New   PT LONG TERM GOAL #4   Title She will be able to stand for 45 min with mild pain     Time 6   Period Weeks   Status New               Plan - 11/24/14 1406    Clinical Impression Statement  She is limited due to pain and stiffness. Nothing has helped her pain so far. Will limit exericse intially so will see 1x/week for first 3 weeks and this was good for her due to transportation issues   Pt will benefit from skilled therapeutic intervention in order to improve on the following deficits Decreased range of motion;Difficulty walking;Pain;Increased muscle  spasms;Decreased strength;Decreased activity tolerance   Rehab Potential Good   PT Frequency 1x / week   PT Duration 3 weeks  then 2x/week for 3 weeks if improving   PT Treatment/Interventions Moist Heat;Electrical Stimulation;Ultrasound;Patient/family education;Manual techniques;Therapeutic exercise  SLR, SAQ at 20 degrees and qudas sets per MD for now   PT Next Visit Plan Review HEP and add SAQ   PT Home Exercise Plan SLR and quad sets   Consulted and Agree with Plan of Care Patient          G-Codes - 12/19/14 1410    Functional Assessment Tool Used FOTO   Functional Limitation Mobility: Walking and moving around   Mobility: Walking and Moving Around Current Status [phone]) At least 80 percent but less than 100 percent impaired, limited or restricted   Mobility: Walking and Moving Around Goal Status [phone]) At least 40 percent but less than 60 percent impaired, limited or restricted       Problem List Patient Active Problem List   Diagnosis Date Noted  . Bronchitis 09/06/2014  . Anxiety 09/06/2014  . History of MI 09/06/2014  . Costochondritis 07/29/2013  . Chest pain with moderate risk of acute coronary syndrome 06/27/2013  . Leg fatigue 06/27/2013  . Smoking 06/27/2013  . Borderline hypertension 06/27/2013  . Low back pain 06/14/2013  . Leg pain, bilateral 06/14/2013  . GASTROPARESIS 10/26/2009  . Chest pain 05/02/2009  . HYPERLIPIDEMIA 11/18/2008  . ASTHMA, UNSPECIFIED, UNSPECIFIED STATUS 11/18/2008  . COPD 11/18/2008  . HEPATIC CYST 11/18/2008  . NAUSEA AND VOMITING 11/18/2008    Darrel Hoover PT December 19, 2014, 2:12 PM  [facility] [address] [place], [place], [address] Phone: [phone]   Fax:  [phone]

## 2014-11-28 ENCOUNTER — Ambulatory Visit: Payer: Medicare Other | Admitting: Physical Therapy

## 2014-11-28 DIAGNOSIS — M25662 Stiffness of left knee, not elsewhere classified: Secondary | ICD-10-CM | POA: Diagnosis not present

## 2014-11-28 DIAGNOSIS — M79662 Pain in left lower leg: Secondary | ICD-10-CM

## 2014-11-28 DIAGNOSIS — R262 Difficulty in walking, not elsewhere classified: Secondary | ICD-10-CM

## 2014-11-28 NOTE — Therapy (Signed)
[name], [place], [address] Phone: [phone]   Fax:  [phone]  Physical Therapy Treatment  Patient Details  Name: Lori [name] MRN: [id number] Date of Birth: 1956/07/14 Referring Provider:  Marybelle Killings, MD  Encounter Date: 11/28/2014      PT End of Session - 11/28/14 0933    Activity Tolerance Patient limited by pain      Past Medical History  Diagnosis Date  . COPD (chronic obstructive pulmonary disease) with chronic bronchitis     & Emphysema  . Acute MI     x3  . Asthma   . Barrett esophagus 2007  . Hiatal hernia [id number]  . Duodenitis without mention of hemorrhage 2007  . Esophageal stricture   . Esophageal reflux 2007  . Anxiety   . Depression   . Multiple fractures     from falls, fx rt. elbow, fx left wrist, bilateral ankles  . Allergy   . Ulcer   . H/O hiatal hernia   . Chest pain 05-02-2009    echo  EF 55%  . H/O cardiovascular stress test 05-02-2009    low risk scan, neg. for ischemia, no ekg changes    Past Surgical History  Procedure Laterality Date  . Abdominal hysterectomy    . Neck surgery    . Cholecystectomy    . Elbow fracture surgery      right  . Wrist fracture surgery      left, has plate  . Colonoscopy    . Nm myoview ltd  07/02/2013    Normal LV function, EF 83%. (Likely overestimated due to low volume);; no evidence of ischemia or infarction. Normal wall motion.  . Lower extremity arterial dopplers  07/06/2013    RABI 1.0, LABI 1.1.; No evidence of significant vascular atherogenic plaque    There were no vitals filed for this visit.  Visit Diagnosis:  Difficulty walking  Pain of left lower leg      Subjective Assessment - 11/28/14 0901    Symptoms Pain 8/ 10  Worse with walking.  Wakes her up at night.  Has been doing her exercises and walks for exercises   Currently in Pain? Yes   Pain Score 8    Pain Location Knee   Pain Orientation  Left   Pain Descriptors / Indicators Sharp;Spasm  Cramps, Both legs   Pain Type Chronic pain   Pain Onset More than a month ago   Pain Frequency Constant   Aggravating Factors  walking, sitting sleeping.   Pain Relieving Factors nothing   Effect of Pain on Daily Activities Limits all activity   Multiple Pain Sites Yes                       [facility] Adult PT Treatment/Exercise - 11/28/14 0910    Ambulation/Gait   Assistive device --  SPC   Gait Pattern --  step to   Ambulation Surface --  Flat   Knee/Hip Exercises: Supine   Quad Sets 5 reps  4 sets   Quad Sets Limitations pain   Short Arc Quad Sets 5 reps  3 sets   Short Arc Quad Sets Limitations pain   Straight Leg Raises 5 reps;3 sets   Straight Leg Raises Limitations pain, so tried standing, , not any better, AA supine   Moist Heat Therapy   Number Minutes Moist Heat 5 Minutes   Moist Heat Location --  Knee Lt, not tolerated even with extra towles                PT Education - 11/28/14 0930    Education provided Yes   Education Details gait cane   Person(s) Educated Other (comment)   Methods Explanation   Comprehension Verbalized understanding;Returned demonstration          PT Short Term Goals - 11/24/14 1400    PT SHORT TERM GOAL #1   Title Independnet with intial HEP    Time 3   Period Weeks   Status New   PT SHORT TERM GOAL #2   Title she will report pain decreased 30% with walking in and outof home.    Time 3   Period Weeks   Status New   PT SHORT TERM GOAL #3   Title increaed active Lt knee extension to -5 degrees to ease pain with wlaking   Time 3   Period Weeks   Status New   PT SHORT TERM GOAL #4   Title Active LT knee flexion top 115 degrees to ease pain with accessing car and tub   Time 3   Period Weeks   Status New   PT SHORT TERM GOAL #5   Title walk 300 feet or more with mild LT knee pain   Time 3   Period Weeks   Status New           PT Long Term Goals  - 11/24/14 1356    PT LONG TERM GOAL #1   Title Independent with all HEP issued as of last visit   Time 6   Period Weeks   Status New   PT LONG TERM GOAL #2   Title Her pain will be reduced 50% or more with walking in community   Time 6   Period Weeks   Status New   PT LONG TERM GOAL #3   Title Her acttive range will be 0-125 degrees with mild pain   Time 6   Period Weeks   Status New   PT LONG TERM GOAL #4   Title She will be able to stand for 45 min with mild pain     Time 6   Period Weeks   Status New               Plan - 11/28/14 2836    Clinical Impression Statement Still limited by pain.  Adherent with home exercises. Hyper sensitive to tough, movement.  Progress toward home exercise goals.   PT Next Visit Plan Review HEP and add SAQ        Problem List Patient Active Problem List   Diagnosis Date Noted  . Bronchitis 09/06/2014  . Anxiety 09/06/2014  . History of MI 09/06/2014  . Costochondritis 07/29/2013  . Chest pain with moderate risk of acute coronary syndrome 06/27/2013  . Leg fatigue 06/27/2013  . Smoking 06/27/2013  . Borderline hypertension 06/27/2013  . Low back pain 06/14/2013  . Leg pain, bilateral 06/14/2013  . GASTROPARESIS 10/26/2009  . Chest pain 05/02/2009  . HYPERLIPIDEMIA 11/18/2008  . ASTHMA, UNSPECIFIED, UNSPECIFIED STATUS 11/18/2008  . COPD 11/18/2008  . HEPATIC CYST 11/18/2008  . NAUSEA AND VOMITING 11/18/2008    Adri Schloss 11/28/2014, 10:57 AM  [facility] [address] [place], [place], [address] Phone: [phone]   Fax:  [phone]

## 2014-11-28 NOTE — Patient Instructions (Signed)
Get a cane.

## 2014-12-01 IMAGING — CT CT ABD-PELV W/ CM
2 of 5 series · 17 of 46 positions shown, 19 images · IV contrast (omnipaque)
Comparison: CT scan dated 08/31/2008

CLINICAL DATA: Abdominal pain with cramping and bloating.  Nausea
and vomiting.

CT ABDOMEN AND PELVIS WITH CONTRAST
TECHNIQUE: Multidetector CT imaging of the abdomen and pelvis was
performed following the standard protocol during bolus
administration of intravenous contrast.
Contrast: 100mL OMNIPAQUE IOHEXOL 300 MG/ML  SOLN

[Series 2: abd/ pel 5mm · axial · 0.63mm/px · z∈[-426,-12]mm · 14 of 93 slices shown, 16 images]
[im 5/93  soft-tissue]
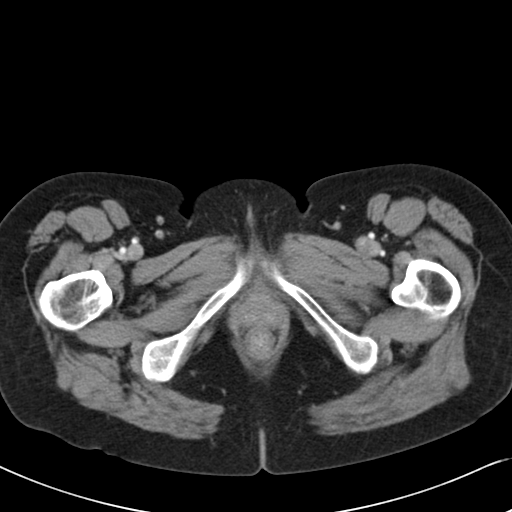
[im 5/93  bone]
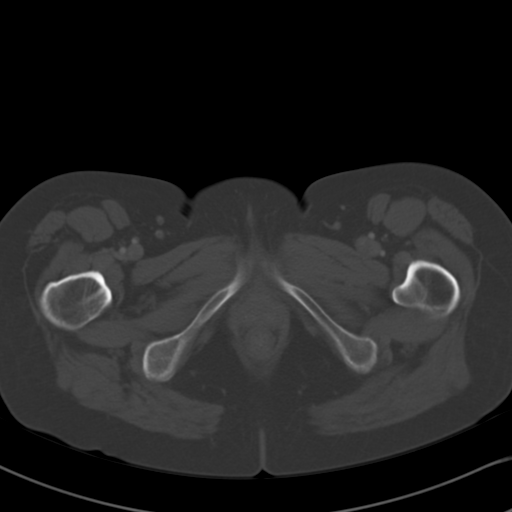
[im 14/93  soft-tissue]
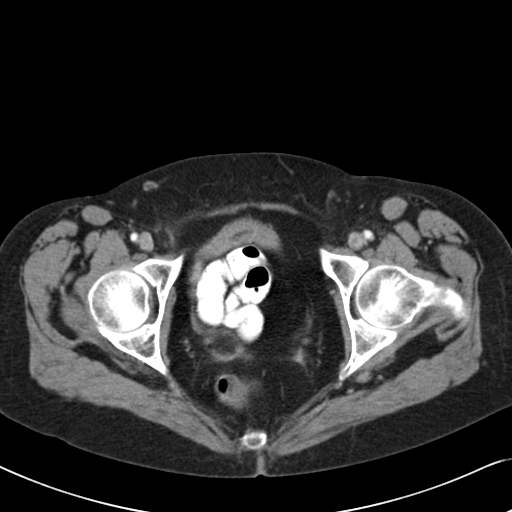
[im 19/93  soft-tissue]
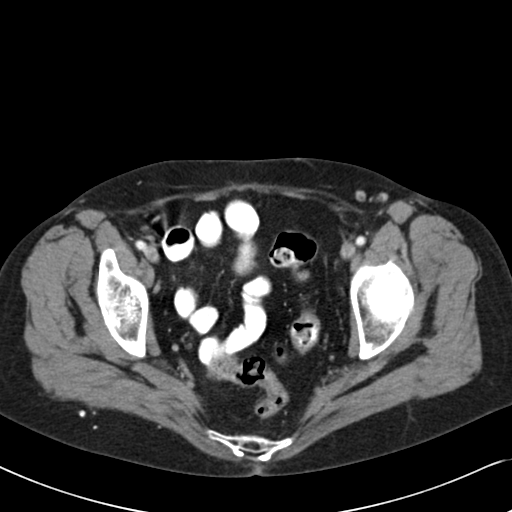
[im 24/93  soft-tissue]
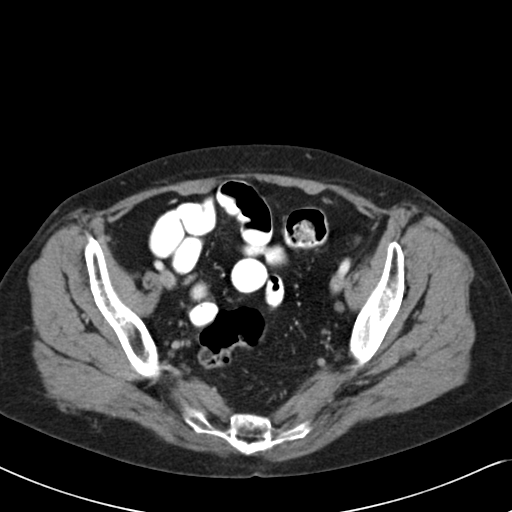
[im 33/93  soft-tissue]
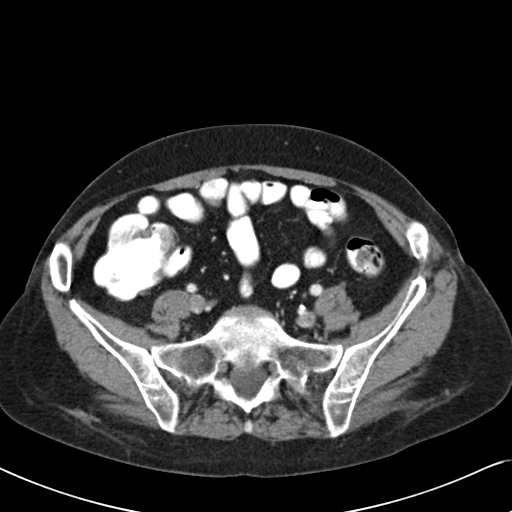
[im 37/93  soft-tissue]
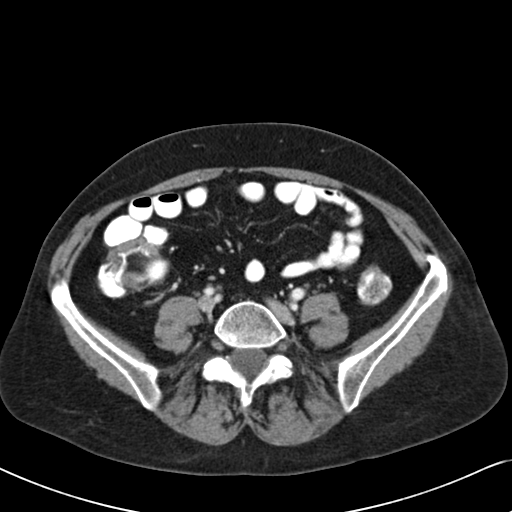
[im 42/93  soft-tissue]
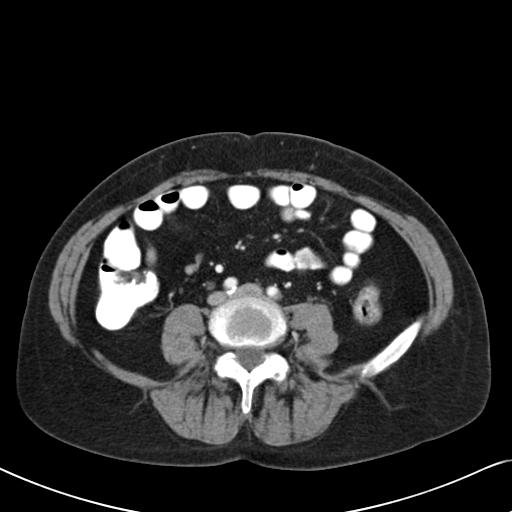
[im 51/93  soft-tissue]
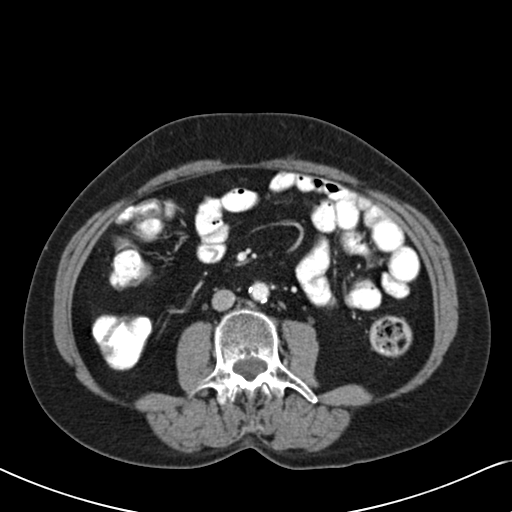
[im 56/93  soft-tissue]
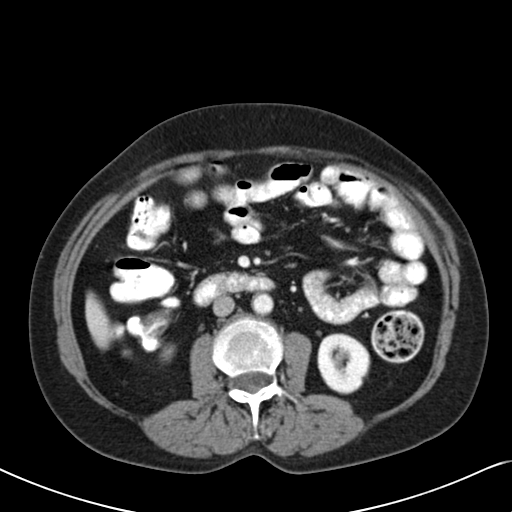
[im 56/93  bone]
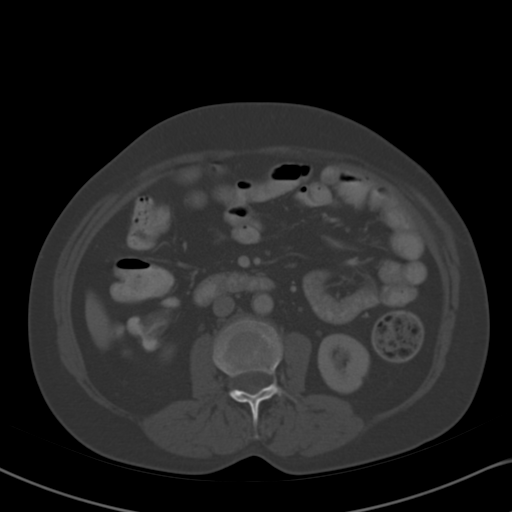
[im 60/93  soft-tissue]
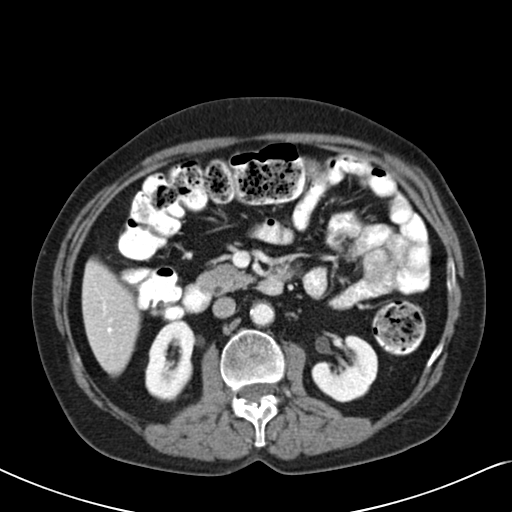
[im 70/93  soft-tissue]
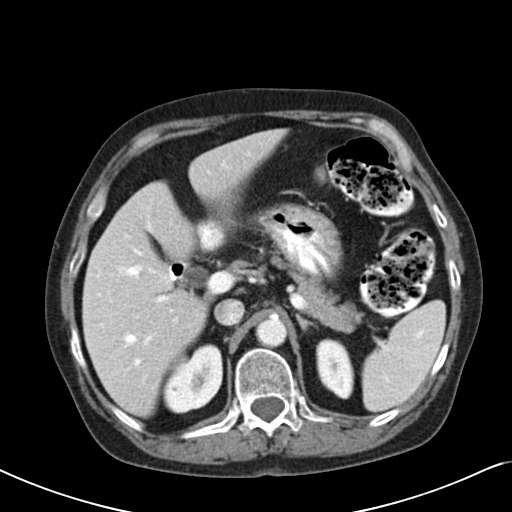
[im 74/93  soft-tissue]
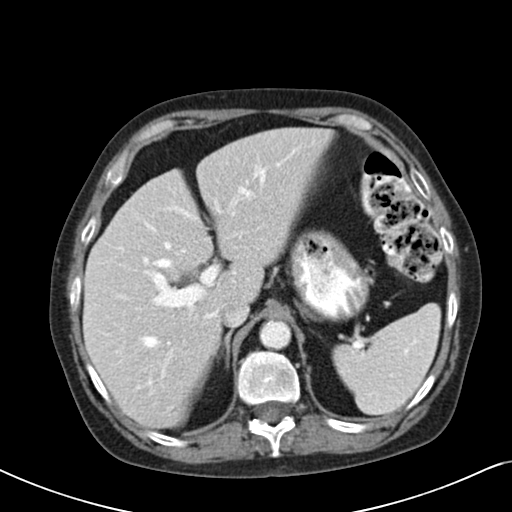
[im 79/93  soft-tissue]
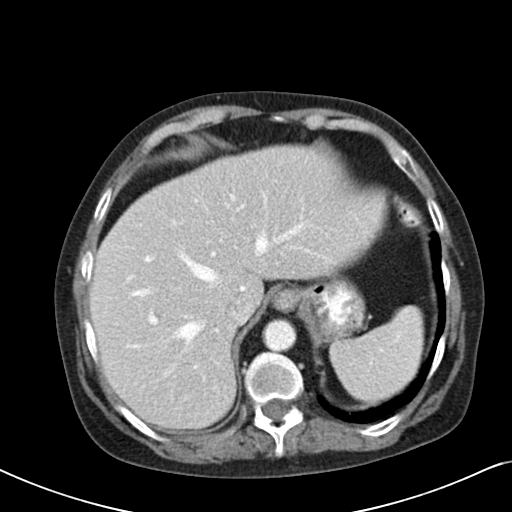
[im 88/93  soft-tissue]
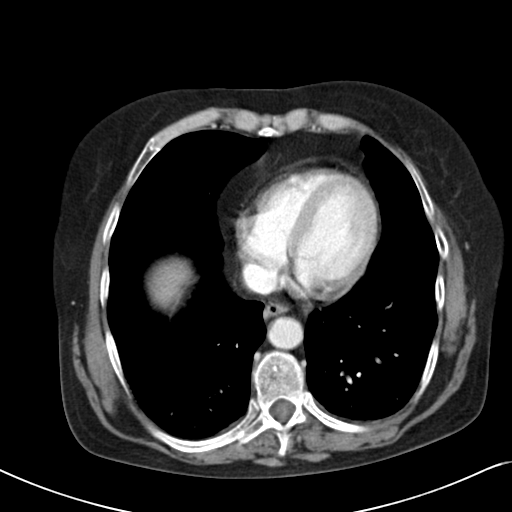

[Series 602: cor · coronal · 0.94mm/px · 3 of 108 slices shown]
[im 36/108  soft-tissue]
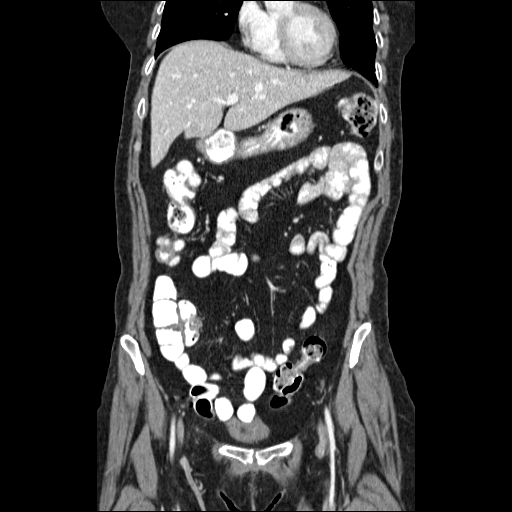
[im 48/108  soft-tissue]
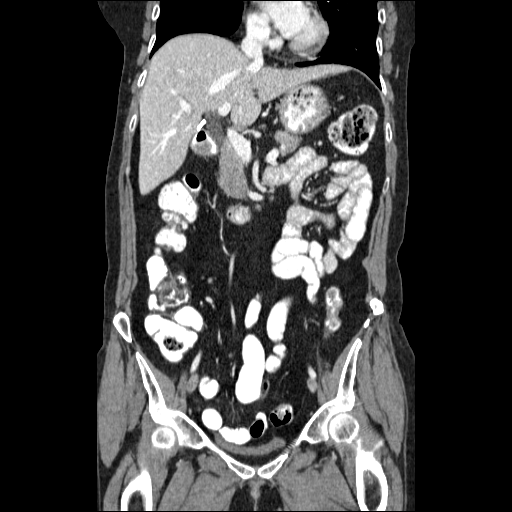
[im 60/108  soft-tissue]
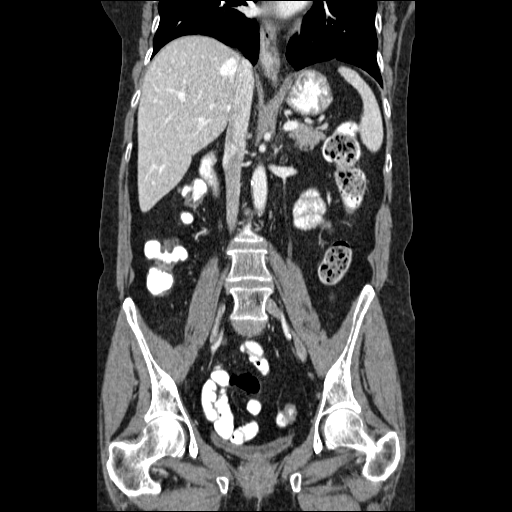

[17 of 46 positions shown; findings below may reference images not displayed]

FINDINGS: The liver, spleen, pancreas, adrenal glands, and kidneys
are normal.  Gallbladder has been removed.  There is slight
prominence of the common bile duct but this has diminished since
the prior study and is within normal limits for a post
cholecystectomy patient.

The uterus and ovaries and appendix have been removed.  The bowel
is normal.  Moderate atheromatous disease in the distal abdominal
aorta and common iliac arteries.  No acute osseous abnormality.
Evidence of bone harvesting from both iliac bones.  No diverticular
disease.
IMPRESSION: No acute abnormality of the abdomen or pelvis. Atherosclerotic
disease.

## 2014-12-06 ENCOUNTER — Ambulatory Visit: Payer: Medicare Other | Admitting: Physical Therapy

## 2014-12-06 DIAGNOSIS — M25662 Stiffness of left knee, not elsewhere classified: Secondary | ICD-10-CM | POA: Diagnosis not present

## 2014-12-06 NOTE — Therapy (Addendum)
[address] [place], [place], [address] Phone: [phone]   Fax:  [phone]  Physical Therapy Treatment  Patient Details  Name: Lori [name] MRN: [id number] Date of Birth: [birth year] Referring Provider:  Marybelle Killings, MD  Encounter Date: 12/06/2014      PT End of Session - 12/06/14 1409    Visit Number 3   Number of Visits 10   Date for PT Re-Evaluation 01/05/15   PT Start Time 1330   PT Stop Time 1407   PT Time Calculation (min) 37 min   Activity Tolerance Patient limited by pain      Past Medical History  Diagnosis Date  . COPD (chronic obstructive pulmonary disease) with chronic bronchitis     & Emphysema  . Acute MI     x3  . Asthma   . Barrett esophagus 2007  . Hiatal hernia [id number]  . Duodenitis without mention of hemorrhage 2007  . Esophageal stricture   . Esophageal reflux 2007  . Anxiety   . Depression   . Multiple fractures     from falls, fx rt. elbow, fx left wrist, bilateral ankles  . Allergy   . Ulcer   . H/O hiatal hernia   . Chest pain 05-02-2009    echo  EF 55%  . H/O cardiovascular stress test 05-02-2009    low risk scan, neg. for ischemia, no ekg changes    Past Surgical History  Procedure Laterality Date  . Abdominal hysterectomy    . Neck surgery    . Cholecystectomy    . Elbow fracture surgery      right  . Wrist fracture surgery      left, has plate  . Colonoscopy    . Nm myoview ltd  07/02/2013    Normal LV function, EF 83%. (Likely overestimated due to low volume);; no evidence of ischemia or infarction. Normal wall motion.  . Lower extremity arterial dopplers  07/06/2013    RABI 1.0, LABI 1.1.; No evidence of significant vascular atherogenic plaque    There were no vitals filed for this visit.  Visit Diagnosis:  Joint stiffness of left lower leg      Subjective Assessment - 12/06/14 1329    Symptoms Soft tissue work increased all night and the  next day.  10/10 worse with walking  Wide base quad cane helps with her balance.   Currently in Pain? Yes   Pain Score 10-Worst pain ever   Pain Location Knee   Pain Orientation Right;Left  Lt worse.   Pain Descriptors / Indicators Spasm;Sharp   Pain Type Chronic pain   Aggravating Factors  walking, sleeping standing   Pain Relieving Factors nothing   Effect of Pain on Daily Activities Has to hold rails for steps.  Limits activity.   Multiple Pain Sites No                       [facility] Adult PT Treatment/Exercise - 12/06/14 1345    Ambulation/Gait   Assistive device Large base quad cane   Gait Comments gait safety improved with quad cane   Knee/Hip Exercises: Seated   Long Arc Quad 10 reps  very small arc.   Heel Slides Limitations 10  pillow case slide floor, limited by pain   Other Seated Knee Exercises heel lifts, toe lifts  10 reps , painful.   Knee/Hip Exercises: Supine   Quad Sets 5 reps  5  seconds   Heel Slides --  3 reps, stopped due to pain.   Other Supine Knee Exercises heel press isometrics 10 reps 5 second holds.  Hamstrings                  PT Short Term Goals - 12/06/14 1413    PT SHORT TERM GOAL #1   Title Independnet with intial HEP    Time 3   Period Weeks   Status Achieved   PT SHORT TERM GOAL #2   Title she will report pain decreased 30% with walking in and outof home.    Time 3   Period Weeks   Status Not Met   PT SHORT TERM GOAL #3   Title increaed active Lt knee extension to -5 degrees to ease pain with wlaking   Baseline -50 degrees Lt knee extension sitting. Active. Lt     Time 3   Period Weeks   Status Not Met   PT SHORT TERM GOAL #4   Title Active LT knee flexion top 115 degrees to ease pain with accessing car and tub   Baseline 70 degrees sitting knee flexion, active Lt   Time 3   Period Weeks   Status Not Met   PT SHORT TERM GOAL #5   Title walk 300 feet or more with mild LT knee pain   Baseline painful  10/10 walking today reported.  body language does not indicate 10/10   Time 3   Period Weeks   Status Not Met           PT Long Term Goals - 12/06/14 1416    PT LONG TERM GOAL #1   Title Independent with all HEP issued as of last visit   Time 6   Period Weeks   Status Achieved   PT LONG TERM GOAL #2   Title Her pain will be reduced 50% or more with walking in community   Time 6   Period Weeks   Status Not Met   PT LONG TERM GOAL #3   Title Her acttive range will be 0-125 degrees with mild pain   Time 6   Period Weeks   Status Not Met   PT LONG TERM GOAL #4   Title She will be able to stand for 45 min with mild pain     Time 6   Period Weeks   Status Not Met               Plan - 12/06/14 1410    Clinical Impression Statement Patient reported increased pain into the next day after last session.  She stopped session early today due to pain.  Noted leg motteled in color.  Cannot stand to be touched.  Declined all suggestions for modalities or taping to help her pain.     PT Next Visit Plan Discharge at patient's request.   Consulted and Agree with Plan of Care Patient        Problem List Patient Active Problem List   Diagnosis Date Noted  . Bronchitis 09/06/2014  . Anxiety 09/06/2014  . History of MI 09/06/2014  . Costochondritis 07/29/2013  . Chest pain with moderate risk of acute coronary syndrome 06/27/2013  . Leg fatigue 06/27/2013  . Smoking 06/27/2013  . Borderline hypertension 06/27/2013  . Low back pain 06/14/2013  . Leg pain, bilateral 06/14/2013  . GASTROPARESIS 10/26/2009  . Chest pain 05/02/2009  . HYPERLIPIDEMIA 11/18/2008  . ASTHMA, UNSPECIFIED, UNSPECIFIED STATUS  11/18/2008  . COPD 11/18/2008  . HEPATIC CYST 11/18/2008  . NAUSEA AND VOMITING 11/18/2008    Cordarious Zeek 12/06/2014, 4:31 PM  [facility] [address] [place], [place], [address] Phone: [phone]   Fax:   [phone]     PHYSICAL THERAPY DISCHARGE SUMMARY  Visits from Start of Care: 3  Current functional level related to goals / functional outcomes: She is not improved and not goals met   Remaining deficits: She remains with limited motion and reported significant pain . She declined all treatment and reports that wih touching her the last visit she had a day of significantly increased pain in the Lt leg. She agreed to discharge   Education / Equipment: SAQ, quad sets SLR Plan: Patient agrees to discharge.  Patient goals were not met. Patient is being discharged due to lack of progress.  ?????    Lillette Boxer Chasse PT          12/06/14            4:53 PM

## 2014-12-13 ENCOUNTER — Encounter: Payer: Medicare Other | Admitting: Physical Therapy

## 2014-12-21 ENCOUNTER — Other Ambulatory Visit (HOSPITAL_COMMUNITY): Payer: Self-pay | Admitting: Orthopaedic Surgery

## 2015-01-02 ENCOUNTER — Encounter (HOSPITAL_BASED_OUTPATIENT_CLINIC_OR_DEPARTMENT_OTHER): Payer: Self-pay | Admitting: *Deleted

## 2015-01-02 NOTE — Progress Notes (Signed)
No labs needed-sees cardiology but is not on any meds-stress test 1/16 nl

## 2015-01-05 ENCOUNTER — Encounter (HOSPITAL_BASED_OUTPATIENT_CLINIC_OR_DEPARTMENT_OTHER): Payer: Self-pay | Admitting: *Deleted

## 2015-01-06 ENCOUNTER — Encounter (HOSPITAL_BASED_OUTPATIENT_CLINIC_OR_DEPARTMENT_OTHER): Payer: Self-pay | Admitting: *Deleted

## 2015-01-06 ENCOUNTER — Ambulatory Visit (HOSPITAL_BASED_OUTPATIENT_CLINIC_OR_DEPARTMENT_OTHER): Payer: Medicare Other | Admitting: Anesthesiology

## 2015-01-06 ENCOUNTER — Encounter (HOSPITAL_BASED_OUTPATIENT_CLINIC_OR_DEPARTMENT_OTHER)
Admission: RE | Disposition: A | Discharge status: Home / Self Care | Payer: Self-pay | Source: Ambulatory Visit | Attending: Orthopaedic Surgery

## 2015-01-06 ENCOUNTER — Ambulatory Visit (HOSPITAL_BASED_OUTPATIENT_CLINIC_OR_DEPARTMENT_OTHER)
Admission: RE | Admit: 2015-01-06 | Discharge: 2015-01-06 | Disposition: A | Discharge status: Home / Self Care | Payer: Medicare Other | Source: Ambulatory Visit | Attending: Orthopaedic Surgery | Admitting: Orthopaedic Surgery

## 2015-01-06 DIAGNOSIS — J439 Emphysema, unspecified: Secondary | ICD-10-CM | POA: Diagnosis not present

## 2015-01-06 DIAGNOSIS — J449 Chronic obstructive pulmonary disease, unspecified: Secondary | ICD-10-CM | POA: Insufficient documentation

## 2015-01-06 DIAGNOSIS — I252 Old myocardial infarction: Secondary | ICD-10-CM | POA: Insufficient documentation

## 2015-01-06 DIAGNOSIS — Z888 Allergy status to other drugs, medicaments and biological substances status: Secondary | ICD-10-CM | POA: Insufficient documentation

## 2015-01-06 DIAGNOSIS — F419 Anxiety disorder, unspecified: Secondary | ICD-10-CM | POA: Insufficient documentation

## 2015-01-06 DIAGNOSIS — Z91040 Latex allergy status: Secondary | ICD-10-CM | POA: Insufficient documentation

## 2015-01-06 DIAGNOSIS — M2242 Chondromalacia patellae, left knee: Secondary | ICD-10-CM | POA: Diagnosis present

## 2015-01-06 DIAGNOSIS — F17210 Nicotine dependence, cigarettes, uncomplicated: Secondary | ICD-10-CM | POA: Insufficient documentation

## 2015-01-06 DIAGNOSIS — K219 Gastro-esophageal reflux disease without esophagitis: Secondary | ICD-10-CM | POA: Insufficient documentation

## 2015-01-06 DIAGNOSIS — M6752 Plica syndrome, left knee: Secondary | ICD-10-CM

## 2015-01-06 DIAGNOSIS — Z91030 Bee allergy status: Secondary | ICD-10-CM | POA: Diagnosis not present

## 2015-01-06 DIAGNOSIS — Z79899 Other long term (current) drug therapy: Secondary | ICD-10-CM | POA: Insufficient documentation

## 2015-01-06 DIAGNOSIS — Z885 Allergy status to narcotic agent status: Secondary | ICD-10-CM | POA: Diagnosis not present

## 2015-01-06 DIAGNOSIS — F329 Major depressive disorder, single episode, unspecified: Secondary | ICD-10-CM | POA: Diagnosis not present

## 2015-01-06 HISTORY — DX: Presence of dental prosthetic device (complete) (partial): Z97.2

## 2015-01-06 HISTORY — DX: Complete loss of teeth, unspecified cause, unspecified class: K08.109

## 2015-01-06 HISTORY — PX: CHONDROPLASTY: SHX5177

## 2015-01-06 HISTORY — PX: KNEE ARTHROSCOPY WITH EXCISION PLICA: SHX5647

## 2015-01-06 SURGERY — ARTHROSCOPY, KNEE, WITH PLICA EXCISION
Anesthesia: General | Site: Knee | Laterality: Left

## 2015-01-06 MED ORDER — ONDANSETRON HCL 4 MG/2ML IJ SOLN
INTRAMUSCULAR | Status: DC | PRN
  Administered 2015-01-06: 4 mg via INTRAVENOUS

## 2015-01-06 MED ORDER — IBUPROFEN 200 MG PO TABS: 200.0000 mg | ORAL_TABLET | Freq: Four times a day (QID) | ORAL | Status: DC | PRN

## 2015-01-06 MED ORDER — FENTANYL CITRATE (PF) 100 MCG/2ML IJ SOLN
INTRAMUSCULAR | Status: DC | PRN
  Administered 2015-01-06: 25 ug via INTRAVENOUS
  Administered 2015-01-06: 50 ug via INTRAVENOUS
  Administered 2015-01-06: 25 ug via INTRAVENOUS

## 2015-01-06 MED ORDER — OXYCODONE HCL 5 MG PO TABS
ORAL_TABLET | ORAL | Status: AC
  Filled 2015-01-06: qty 1

## 2015-01-06 MED ORDER — HYDROMORPHONE HCL 1 MG/ML IJ SOLN
0.2500 mg | INTRAMUSCULAR | Status: DC | PRN
  Administered 2015-01-06 (×4): 0.5 mg via INTRAVENOUS

## 2015-01-06 MED ORDER — HYDROCODONE-ACETAMINOPHEN 7.5-325 MG PO TABS: 2.0000 | ORAL_TABLET | ORAL | Status: DC | PRN

## 2015-01-06 MED ORDER — IBUPROFEN 100 MG/5ML PO SUSP: 200.0000 mg | Freq: Four times a day (QID) | ORAL | Status: DC | PRN

## 2015-01-06 MED ORDER — FENTANYL CITRATE (PF) 100 MCG/2ML IJ SOLN
INTRAMUSCULAR | Status: AC
  Filled 2015-01-06: qty 4

## 2015-01-06 MED ORDER — MEPERIDINE HCL 25 MG/ML IJ SOLN: 6.2500 mg | INTRAMUSCULAR | Status: DC | PRN

## 2015-01-06 MED ORDER — HYDROMORPHONE HCL 1 MG/ML IJ SOLN
INTRAMUSCULAR | Status: AC
  Filled 2015-01-06: qty 1

## 2015-01-06 MED ORDER — LIDOCAINE HCL (CARDIAC) 20 MG/ML IV SOLN
INTRAVENOUS | Status: DC | PRN
  Administered 2015-01-06: 60 mg via INTRAVENOUS

## 2015-01-06 MED ORDER — LACTATED RINGERS IV SOLN
INTRAVENOUS | Status: DC
  Administered 2015-01-06 (×2): via INTRAVENOUS

## 2015-01-06 MED ORDER — KETOROLAC TROMETHAMINE 30 MG/ML IJ SOLN: 30.0000 mg | Freq: Once | INTRAMUSCULAR | Status: DC | PRN

## 2015-01-06 MED ORDER — DEXAMETHASONE SODIUM PHOSPHATE 4 MG/ML IJ SOLN
INTRAMUSCULAR | Status: DC | PRN
  Administered 2015-01-06: 10 mg via INTRAVENOUS

## 2015-01-06 MED ORDER — PROPOFOL 10 MG/ML IV BOLUS
INTRAVENOUS | Status: AC
  Filled 2015-01-06: qty 20

## 2015-01-06 MED ORDER — OXYCODONE HCL 5 MG/5ML PO SOLN: 5.0000 mg | Freq: Once | ORAL | Status: AC | PRN

## 2015-01-06 MED ORDER — BUPIVACAINE-EPINEPHRINE 0.25% -1:200000 IJ SOLN
INTRAMUSCULAR | Status: DC | PRN
  Administered 2015-01-06: 30 mL

## 2015-01-06 MED ORDER — SODIUM CHLORIDE 0.9 % IR SOLN
Status: DC | PRN
  Administered 2015-01-06: 5000 mL

## 2015-01-06 MED ORDER — PROPOFOL 10 MG/ML IV BOLUS
INTRAVENOUS | Status: DC | PRN
  Administered 2015-01-06: 170 mg via INTRAVENOUS

## 2015-01-06 MED ORDER — OXYCODONE HCL 5 MG PO TABS
5.0000 mg | ORAL_TABLET | Freq: Once | ORAL | Status: AC | PRN
  Administered 2015-01-06: 5 mg via ORAL

## 2015-01-06 MED ORDER — MIDAZOLAM HCL 2 MG/2ML IJ SOLN
1.0000 mg | INTRAMUSCULAR | Status: DC | PRN
  Administered 2015-01-06: 2 mg via INTRAVENOUS

## 2015-01-06 MED ORDER — MIDAZOLAM HCL 2 MG/2ML IJ SOLN
INTRAMUSCULAR | Status: AC
  Filled 2015-01-06: qty 2

## 2015-01-06 SURGICAL SUPPLY — 42 items
APL SKNCLS STERI-STRIP NONHPOA (GAUZE/BANDAGES/DRESSINGS) ×1
BANDAGE ELASTIC 6 VELCRO ST LF (GAUZE/BANDAGES/DRESSINGS) ×6 IMPLANT
BENZOIN TINCTURE PRP APPL 2/3 (GAUZE/BANDAGES/DRESSINGS) ×3 IMPLANT
BLADE 4.2CUDA (BLADE) ×3 IMPLANT
BLADE CUDA 5.5 (BLADE) IMPLANT
BLADE GREAT WHITE 4.2 (BLADE) IMPLANT
BLADE GREAT WHITE 4.2MM (BLADE)
CLOSURE WOUND 1/4X4 (GAUZE/BANDAGES/DRESSINGS) ×1
DRAPE ARTHROSCOPY W/POUCH 114 (DRAPES) ×3 IMPLANT
DRSG TEGADERM 4X4.75 (GAUZE/BANDAGES/DRESSINGS) ×3 IMPLANT
DURAPREP 26ML APPLICATOR (WOUND CARE) ×3 IMPLANT
ELECT MENISCUS 165MM 90D (ELECTRODE) IMPLANT
ELECT REM PT RETURN 9FT ADLT (ELECTROSURGICAL)
ELECTRODE REM PT RTRN 9FT ADLT (ELECTROSURGICAL) IMPLANT
GAUZE SPONGE 4X4 12PLY STRL (GAUZE/BANDAGES/DRESSINGS) ×3 IMPLANT
GLOVE BIO SURGEON STRL SZ7.5 (GLOVE) ×3 IMPLANT
GLOVE BIOGEL PI IND STRL 7.0 (GLOVE) IMPLANT
GLOVE BIOGEL PI IND STRL 7.5 (GLOVE) IMPLANT
GLOVE BIOGEL PI IND STRL 8 (GLOVE) ×1 IMPLANT
GLOVE BIOGEL PI INDICATOR 7.0 (GLOVE) ×2
GLOVE BIOGEL PI INDICATOR 7.5 (GLOVE) ×2
GLOVE BIOGEL PI INDICATOR 8 (GLOVE) ×2
GLOVE SURG SS PI 7.5 STRL IVOR (GLOVE) ×2 IMPLANT
GOWN STRL REUS W/ TWL LRG LVL3 (GOWN DISPOSABLE) IMPLANT
GOWN STRL REUS W/TWL LRG LVL3 (GOWN DISPOSABLE) ×6
HOLDER KNEE FOAM BLUE (MISCELLANEOUS) ×3 IMPLANT
KNEE WRAP E Z 3 GEL PACK (MISCELLANEOUS) ×2 IMPLANT
MANIFOLD NEPTUNE II (INSTRUMENTS) ×2 IMPLANT
PACK ARTHROSCOPY DSU (CUSTOM PROCEDURE TRAY) ×3 IMPLANT
PACK BASIN DAY SURGERY FS (CUSTOM PROCEDURE TRAY) ×3 IMPLANT
PAD CAST 4YDX4 CTTN HI CHSV (CAST SUPPLIES) ×1 IMPLANT
PADDING CAST ABS 4INX4YD NS (CAST SUPPLIES)
PADDING CAST ABS 6INX4YD NS (CAST SUPPLIES)
PADDING CAST ABS COTTON 4X4 ST (CAST SUPPLIES) IMPLANT
PADDING CAST ABS COTTON 6X4 NS (CAST SUPPLIES) IMPLANT
PADDING CAST COTTON 4X4 STRL (CAST SUPPLIES) ×3
PENCIL BUTTON HOLSTER BLD 10FT (ELECTRODE) IMPLANT
SET ARTHROSCOPY TUBING (MISCELLANEOUS) ×3
SET ARTHROSCOPY TUBING LN (MISCELLANEOUS) ×1 IMPLANT
STRIP CLOSURE SKIN 1/4X4 (GAUZE/BANDAGES/DRESSINGS) ×2 IMPLANT
TOWEL OR 17X24 6PK STRL BLUE (TOWEL DISPOSABLE) ×3 IMPLANT
WATER STERILE IRR 1000ML POUR (IV SOLUTION) ×3 IMPLANT

## 2015-01-06 NOTE — Anesthesia Postprocedure Evaluation (Signed)
  Anesthesia Post-op Note  Patient: Lori [name]  Procedure(s) Performed: Procedure(s): KNEE ARTHROSCOPY WITH EXCISION PLICA (Left) CHONDROPLASTY (Left)  Patient Location: PACU  Anesthesia Type: General   Level of Consciousness: awake, alert  and oriented  Airway and Oxygen Therapy: Patient Spontanous Breathing  Post-op Pain: mild  Post-op Assessment: Post-op Vital signs reviewed  Post-op Vital Signs: Reviewed  Last Vitals:  Filed Vitals:   01/06/15 1021  BP: 123/59  Pulse: 93  Temp: 36.5 C  Resp: 16    Complications: No apparent anesthesia complications

## 2015-01-06 NOTE — Discharge Instructions (Signed)
Elevation knee above heart. OK to remove dressing in 48 hrs down to op site ( clear plastic sticky which will have some purple looking blood under it). Leave op site on , may shower then rewrap ace  Over Op Site .   See Dr. Lorin Mercy in one week   Post Anesthesia Home Care Instructions  Activity: Get plenty of rest for the remainder of the day. A responsible adult should stay with you for 24 hours following the procedure.  For the next 24 hours, DO NOT: -Drive a car -Paediatric nurse -Drink alcoholic beverages -Take any medication unless instructed by your physician -Make any legal decisions or sign important papers.  Meals: Start with liquid foods such as gelatin or soup. Progress to regular foods as tolerated. Avoid greasy, spicy, heavy foods. If nausea and/or vomiting occur, drink only clear liquids until the nausea and/or vomiting subsides. Call your physician if vomiting continues.  Special Instructions/Symptoms: Your throat may feel dry or sore from the anesthesia or the breathing tube placed in your throat during surgery. If this causes discomfort, gargle with warm salt water. The discomfort should disappear within 24 hours.  If you had a scopolamine patch placed behind your ear for the management of post- operative nausea and/or vomiting:  1. The medication in the patch is effective for 72 hours, after which it should be removed.  Wrap patch in a tissue and discard in the trash. Wash hands thoroughly with soap and water. 2. You may remove the patch earlier than 72 hours if you experience unpleasant side effects which may include dry mouth, dizziness or visual disturbances. 3. Avoid touching the patch. Wash your hands with soap and water after contact with the patch.

## 2015-01-06 NOTE — Anesthesia Preprocedure Evaluation (Signed)
Anesthesia Evaluation  Patient identified by MRN, date of birth, ID band Patient awake    Reviewed: Allergy & Precautions, NPO status , Patient's Chart, lab work & pertinent test results  Airway Mallampati: I  TM Distance: >3 FB Neck ROM: Full    Dental  (+) Teeth Intact, Dental Advisory Given   Pulmonary asthma , COPD COPD inhaler, Current Smoker,  breath sounds clear to auscultation        Cardiovascular + Past MI Rhythm:Regular Rate:Normal     Neuro/Psych    GI/Hepatic hiatal hernia,   Endo/Other    Renal/GU      Musculoskeletal   Abdominal   Peds  Hematology   Anesthesia Other Findings   Reproductive/Obstetrics                             Anesthesia Physical Anesthesia Plan  ASA: III  Anesthesia Plan: General   Post-op Pain Management:    Induction: Intravenous  Airway Management Planned: LMA  Additional Equipment:   Intra-op Plan:   Post-operative Plan: Extubation in OR  Informed Consent: I have reviewed the patients History and Physical, chart, labs and discussed the procedure including the risks, benefits and alternatives for the proposed anesthesia with the patient or authorized representative who has indicated his/her understanding and acceptance.   Dental advisory given  Plan Discussed with: CRNA, Anesthesiologist and Surgeon  Anesthesia Plan Comments:         Anesthesia Quick Evaluation

## 2015-01-06 NOTE — H&P (Signed)
Lori [name] is an 59 y.o. female.   Chief Complaint: left knee pain since acute injury with anterior knee contusion  HPI: 59 year old female known to me for many years previous cervical fusion procedures with acute injury to her left knee with anterior contusion persistent pain. MRI scan documented full-thickness loss of the medial patellar facet and also anterior Hoffa fat pad scar tissue consistent with her contusion. She's been treated with anti-inflammatories and intra-articular injections with persistent problems  Past Medical History  Diagnosis Date  . COPD (chronic obstructive pulmonary disease) with chronic bronchitis     & Emphysema  . Asthma   . Barrett esophagus 2007  . Hiatal hernia [id number]  . Duodenitis without mention of hemorrhage 2007  . Esophageal stricture   . Esophageal reflux 2007  . Anxiety   . Depression   . Multiple fractures     from falls, fx rt. elbow, fx left wrist, bilateral ankles  . Allergy   . Ulcer   . H/O hiatal hernia   . Chest pain 05-02-2009    echo  EF 55%  . H/O cardiovascular stress test 05-02-2009    low risk scan, neg. for ischemia, no ekg changes  . Full dentures   . Acute MI     x3    Past Surgical History  Procedure Laterality Date  . Abdominal hysterectomy    . Neck surgery    . Cholecystectomy    . Elbow fracture surgery      right  . Wrist fracture surgery      left, has plate  . Colonoscopy    . Nm myoview ltd  07/02/2013    Normal LV function, EF 83%. (Likely overestimated due to low volume);; no evidence of ischemia or infarction. Normal wall motion.  . Lower extremity arterial dopplers  07/06/2013    RABI 1.0, LABI 1.1.; No evidence of significant vascular atherogenic plaque  . Tonsillectomy      Family History  Problem Relation Age of Onset  . Emphysema Father   . Colon cancer Neg Hx   . Diabetes Maternal Grandmother   . Diabetes Maternal Aunt   . Diabetes      mat. cousin  . Heart disease Brother   .  Dementia Mother    Social History:  reports that she has been smoking Cigarettes.  She has a 5.25 pack-year smoking history. She has never used smokeless tobacco. She reports that she does not drink alcohol or use illicit drugs.  Allergies:  Allergies  Allergen Reactions  . Bee Venom Swelling  . Codeine Nausea Only    CAUSES ULCERS  . Ibuprofen Nausea And Vomiting  . Propoxyphene N-Acetaminophen Nausea Only  . Toradol [Ketorolac Tromethamine] Nausea And Vomiting  . Hydrocodone-Acetaminophen Nausea Only    REACTION: "sick"  . Latex Rash    Medications Prior to Admission  Medication Sig Dispense Refill  . ALPRAZolam (XANAX) 1 MG tablet Take 1 tablet by mouth as needed.  2  . alprazolam (XANAX) 2 MG tablet Take 1 tablet by mouth as needed.  0  . butalbital-acetaminophen-caffeine (FIORICET, ESGIC) 50-325-40 MG per tablet Take 1 tablet by mouth 2 (two) times daily as needed for headache.    . cyclobenzaprine (FLEXERIL) 10 MG tablet Take 10 mg by mouth 2 (two) times daily as needed for muscle spasms.    Marland Kitchen dexlansoprazole (DEXILANT) 60 MG capsule Take 60 mg by mouth daily.    . mometasone-formoterol (DULERA) 200-5 MCG/ACT AERO Inhale 2  puffs into the lungs 2 (two) times daily.    . montelukast (SINGULAIR) 10 MG tablet Take 10 mg by mouth at bedtime.    Marland Kitchen PROAIR HFA 108 (90 BASE) MCG/ACT inhaler Inhale 1 mcg into the lungs 2 (two) times daily.  4  . valACYclovir (VALTREX) 500 MG tablet Take 500 mg by mouth daily.    Marland Kitchen aspirin 81 MG tablet Take 81 mg by mouth daily.    Marland Kitchen dicyclomine (BENTYL) 20 MG tablet Take 1 tablet by mouth daily.  3  . diphenoxylate-atropine (LOMOTIL) 2.5-0.025 MG per tablet Take 1 tablet by mouth every 4 (four) hours as needed.    . docusate sodium (COLACE) 100 MG capsule Take 100 mg by mouth 2 (two) times daily.    . hydrOXYzine (ATARAX/VISTARIL) 25 MG tablet Take 1 tablet by mouth as needed.  3  . ibuprofen (ADVIL,MOTRIN) 800 MG tablet Take 800 mg by mouth 3 (three)  times daily as needed for mild pain or moderate pain.    Marland Kitchen ketoconazole (NIZORAL) 200 MG tablet Take 1 tablet (200 mg total) by mouth daily. 28 tablet 0  . loperamide (IMODIUM) 2 MG capsule Take 1 capsule by mouth daily.  0  . LORazepam (ATIVAN) 1 MG tablet Take 1 tablet (1 mg total) by mouth 3 (three) times daily as needed (dizziness / nausea). 15 tablet 0  . meclizine (ANTIVERT) 25 MG tablet Take 1 tablet by mouth daily.  2  . nitroGLYCERIN (NITROSTAT) 0.4 MG SL tablet Place 0.4 mg under the tongue every 5 (five) minutes as needed for chest pain.    Marland Kitchen omeprazole (PRILOSEC) 20 MG capsule Take 20 mg by mouth daily.    . ondansetron (ZOFRAN) 4 MG tablet Take 4 mg by mouth every 6 (six) hours as needed. For nausea    . oxybutynin (DITROPAN) 5 MG tablet Take 5 mg by mouth 2 (two) times daily.    . promethazine (PHENERGAN) 25 MG tablet Take 1 tablet by mouth as needed.  0  . promethazine-dextromethorphan (PROMETHAZINE-DM) 6.25-15 MG/5ML syrup Take 5 mLs by mouth 4 (four) times daily as needed for cough.    . ranitidine (ZANTAC) 150 MG tablet Take 150 mg by mouth 2 (two) times daily.    . traMADol (ULTRAM) 50 MG tablet Take 50 mg by mouth every 8 (eight) hours as needed.    . triamcinolone cream (KENALOG) 0.1 % Apply 1 application topically 2 (two) times daily.      No results found for this or any previous visit (from the past 48 hour(s)). No results found.  Review of Systems  Constitutional: Negative.  Negative for chills, weight loss and malaise/fatigue.  Respiratory:       Long-term smoker  Musculoskeletal: Positive for back pain, joint pain, falls and neck pain.  Skin: Negative for itching and rash.  Neurological: Negative for dizziness and headaches.  Psychiatric/Behavioral:       Positive for anxiety history of depression poor pain tolerance    Blood pressure 126/64, pulse 72, temperature 97.9 F (36.6 C), temperature source Oral, resp. rate 18, height 5\' 4"  (1.626 m), weight 56.7 kg  (125 lb), SpO2 100 %. Physical Exam  Constitutional: She appears well-developed.  Thin, global decrease muscle strength  HENT:  Head: Normocephalic and atraumatic.  Neck: Normal range of motion.  Cardiovascular: Normal rate.   Respiratory: Effort normal. She has no wheezes. She has no rales.  GI: Soft. Bowel sounds are normal.  Musculoskeletal:  Tenderness over anterior  left knee patellar tendon region. Negative subluxation of the patella, negative Lachman, pain with patellar loading. Positive patellar grind test collateral ligaments are stable distal pulses are 2+ sciatic nerve function is normal  Skin: Skin is warm and dry.  Psychiatric:  Patient anxious complains of the left knee pain difficulty sleeping     Assessment/Plan Left knee acute injury with knee contusion scar tissue in Hoffa's fat pad full-thickness medial Patella  facet loss. She is failed the physical therapy anti-inflammatories intra-articular injections. Plan arthroscopic debridement possible lateral release debridement of Hoffa's fat pad scar tissue. Arthroscopic procedure discussed all questions answered she understands that she may have continued pain possibility of lateral release discussed.  Jovanna Hodges C 01/06/2015, 8:13 AM

## 2015-01-06 NOTE — Interval H&P Note (Signed)
History and Physical Interval Note:  01/06/2015 8:20 AM  Lori [name]  has presented today for surgery, with the diagnosis of Left knee chondromalacia patella  The various methods of treatment have been discussed with the patient and family. After consideration of risks, benefits and other options for treatment, the patient has consented to  Procedure(s): Left Knee Arthroscopy, Debridement (Left) as a surgical intervention .  The patient's history has been reviewed, patient examined, no change in status, stable for surgery.  I have reviewed the patient's chart and labs.  Questions were answered to the patient's satisfaction.     Flannery Cavallero C

## 2015-01-06 NOTE — Brief Op Note (Signed)
01/06/2015  9:13 AM  PATIENT:  Lori [name]  59 y.o. female  PRE-OPERATIVE DIAGNOSIS:  Left knee chondromalacia patella  POST-OPERATIVE DIAGNOSIS:  Left knee chondromalacia patella and plica  PROCEDURE:  Procedure(s): KNEE ARTHROSCOPY WITH EXCISION PLICA (Left) CHONDROPLASTY (Left)  SURGEON:  Surgeon(s) and Role:    * Marybelle Killings, MD - Primary  PHYSICIAN ASSISTANT:   ASSISTANTS: none   ANESTHESIA:   general  EBL:     BLOOD ADMINISTERED:none  DRAINS: none   LOCAL MEDICATIONS USED:  MARCAINE     SPECIMEN:  No Specimen  DISPOSITION OF SPECIMEN:  N/A  COUNTS:  YES  TOURNIQUET:  * No tourniquets in log *  DICTATION: .Dragon Dictation  PLAN OF CARE: Discharge to home after PACU  PATIENT DISPOSITION:  PACU - hemodynamically stable.   Delay start of Pharmacological VTE agent (>24hrs) due to surgical blood loss or risk of bleeding: not applicable

## 2015-01-06 NOTE — Anesthesia Procedure Notes (Signed)
Procedure Name: LMA Insertion Date/Time: 01/06/2015 8:37 AM Performed by: Lyndee Leo Pre-anesthesia Checklist: Patient identified, Emergency Drugs available, Suction available and Patient being monitored Patient Re-evaluated:Patient Re-evaluated prior to inductionOxygen Delivery Method: Circle System Utilized Preoxygenation: Pre-oxygenation with 100% oxygen Intubation Type: IV induction Ventilation: Mask ventilation without difficulty LMA: LMA inserted LMA Size: 4.0 Number of attempts: 1 Airway Equipment and Method: Bite block Placement Confirmation: positive ETCO2 Tube secured with: Tape Dental Injury: Teeth and Oropharynx as per pre-operative assessment

## 2015-01-06 NOTE — Transfer of Care (Signed)
Immediate Anesthesia Transfer of Care Note  Patient: Lori [name]  Procedure(s) Performed: Procedure(s): KNEE ARTHROSCOPY WITH EXCISION PLICA (Left) CHONDROPLASTY (Left)  Patient Location: PACU  Anesthesia Type:General  Level of Consciousness: awake, sedated and patient cooperative  Airway & Oxygen Therapy: Patient Spontanous Breathing and Patient connected to face mask oxygen  Post-op Assessment: Report given to RN and Post -op Vital signs reviewed and stable  Post vital signs: Reviewed and stable  Last Vitals:  Filed Vitals:   01/06/15 0733  BP: 126/64  Pulse: 72  Temp: 36.6 C  Resp: 18    Complications: No apparent anesthesia complications

## 2015-01-09 ENCOUNTER — Encounter (HOSPITAL_BASED_OUTPATIENT_CLINIC_OR_DEPARTMENT_OTHER): Payer: Self-pay | Admitting: Orthopaedic Surgery
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
## 2015-03-11 ENCOUNTER — Emergency Department (HOSPITAL_COMMUNITY)
Admission: EM | Admit: 2015-03-11 | Discharge: 2015-03-11 | Disposition: A | Discharge status: Home / Self Care | Payer: Medicare Other | Attending: Emergency Medicine | Admitting: Emergency Medicine

## 2015-03-11 ENCOUNTER — Emergency Department (HOSPITAL_COMMUNITY): Payer: Medicare Other

## 2015-03-11 ENCOUNTER — Encounter (HOSPITAL_COMMUNITY): Payer: Self-pay | Admitting: Emergency Medicine

## 2015-03-11 DIAGNOSIS — Y999 Unspecified external cause status: Secondary | ICD-10-CM | POA: Insufficient documentation

## 2015-03-11 DIAGNOSIS — Y929 Unspecified place or not applicable: Secondary | ICD-10-CM | POA: Diagnosis not present

## 2015-03-11 DIAGNOSIS — F329 Major depressive disorder, single episode, unspecified: Secondary | ICD-10-CM | POA: Diagnosis not present

## 2015-03-11 DIAGNOSIS — J449 Chronic obstructive pulmonary disease, unspecified: Secondary | ICD-10-CM | POA: Insufficient documentation

## 2015-03-11 DIAGNOSIS — F419 Anxiety disorder, unspecified: Secondary | ICD-10-CM | POA: Insufficient documentation

## 2015-03-11 DIAGNOSIS — Z720 Tobacco use: Secondary | ICD-10-CM | POA: Insufficient documentation

## 2015-03-11 DIAGNOSIS — S8992XA Unspecified injury of left lower leg, initial encounter: Secondary | ICD-10-CM | POA: Insufficient documentation

## 2015-03-11 DIAGNOSIS — Y939 Activity, unspecified: Secondary | ICD-10-CM | POA: Insufficient documentation

## 2015-03-11 DIAGNOSIS — W1839XA Other fall on same level, initial encounter: Secondary | ICD-10-CM | POA: Insufficient documentation

## 2015-03-11 DIAGNOSIS — Z91040 Latex allergy status: Secondary | ICD-10-CM | POA: Diagnosis not present

## 2015-03-11 DIAGNOSIS — K219 Gastro-esophageal reflux disease without esophagitis: Secondary | ICD-10-CM | POA: Insufficient documentation

## 2015-03-11 DIAGNOSIS — I252 Old myocardial infarction: Secondary | ICD-10-CM | POA: Insufficient documentation

## 2015-03-11 DIAGNOSIS — Z79899 Other long term (current) drug therapy: Secondary | ICD-10-CM | POA: Diagnosis not present

## 2015-03-11 DIAGNOSIS — Z9889 Other specified postprocedural states: Secondary | ICD-10-CM | POA: Insufficient documentation

## 2015-03-11 DIAGNOSIS — Z7982 Long term (current) use of aspirin: Secondary | ICD-10-CM | POA: Diagnosis not present

## 2015-03-11 DIAGNOSIS — G8929 Other chronic pain: Secondary | ICD-10-CM | POA: Diagnosis not present

## 2015-03-11 DIAGNOSIS — M25562 Pain in left knee: Secondary | ICD-10-CM

## 2015-03-11 MED ORDER — ONDANSETRON 4 MG PO TBDP
4.0000 mg | ORAL_TABLET | Freq: Once | ORAL | Status: AC
  Administered 2015-03-11: 4 mg via ORAL
  Filled 2015-03-11: qty 1

## 2015-03-11 MED ORDER — OXYCODONE-ACETAMINOPHEN 5-325 MG PO TABS
2.0000 | ORAL_TABLET | Freq: Once | ORAL | Status: AC
  Administered 2015-03-11: 2 via ORAL
  Filled 2015-03-11: qty 2

## 2015-03-11 MED ORDER — OXYCODONE-ACETAMINOPHEN 5-325 MG PO TABS: 2.0000 | ORAL_TABLET | ORAL | Status: DC | PRN

## 2015-03-11 NOTE — Discharge Instructions (Signed)
Knee Pain Follow up with Dr. Lorin Mercy. Take ibuprofen or Tylenol for pain and Percocet for breakthrough pain. Knee pain can be a result of an injury or other medical conditions. Treatment will depend on the cause of your pain. HOME CARE  Only take medicine as told by your doctor.  Keep a healthy weight. Being overweight can make the knee hurt more.  Stretch before exercising or playing sports.  If there is constant knee pain, change the way you exercise. Ask your doctor for advice.  Make sure shoes fit well. Choose the right shoe for the sport or activity.  Protect your knees. Wear kneepads if needed.  Rest when you are tired. GET HELP RIGHT AWAY IF:   Your knee pain does not stop.  Your knee pain does not get better.  Your knee joint feels hot to the touch.  You have a fever. MAKE SURE YOU:   Understand these instructions.  Will watch this condition.  Will get help right away if you are not doing well or get worse. Document Released: 11/29/2008 Document Revised: 11/25/2011 Document Reviewed: 11/29/2008 [facility] Patient Information 2015 [place], [place]. This information is not intended to replace advice given to you by your health care provider. Make sure you discuss any questions you have with your health care provider.

## 2015-03-11 NOTE — ED Notes (Signed)
Pt reports she had L knee surgery on 5/22. Fell on same knee on 5/29. Pain has gradually become worse. Follow-up appointment with Dr. Lorin Mercy on 7/12, could not wait that long.

## 2015-03-11 NOTE — ED Provider Notes (Signed)
CSN: [id number]     Arrival date & time 03/11/15  1048 History   First MD Initiated Contact with Patient 03/11/15 1055     Chief Complaint  Patient presents with  . Knee Pain     (Consider location/radiation/quality/duration/timing/severity/associated sxs/prior Treatment) The history is provided by the patient. No language interpreter was used.   Lori [name] is a 59 year old female with a history of COPD, asthma, anxiety, depression, MI 3 who presents for left knee pain after falling on 01/13/2015. She states that she had outpatient arthroscopic left knee surgery and chondroplasty on for 01/06/2015. She states she has not been seen since and try to take ibuprofen and Tylenol at home for pain. She says the knee has been swollen and tender since the fall. She has been ambulatory with a limping gait using a cane since the incident. She denies calling her orthopedist. She states she has appointment on July 12 with Dr. Lorin Mercy who performed the surgery. She denies any fever, chills, neck pain, back pain, loss of consciousness, fall, nausea, vomiting since then.  Past Medical History  Diagnosis Date  . COPD (chronic obstructive pulmonary disease) with chronic bronchitis     & Emphysema  . Asthma   . Barrett esophagus 2007  . Hiatal hernia [id number]  . Duodenitis without mention of hemorrhage 2007  . Esophageal stricture   . Esophageal reflux 2007  . Anxiety   . Depression   . Multiple fractures     from falls, fx rt. elbow, fx left wrist, bilateral ankles  . Allergy   . Ulcer   . H/O hiatal hernia   . Chest pain 05-02-2009    echo  EF 55%  . H/O cardiovascular stress test 05-02-2009    low risk scan, neg. for ischemia, no ekg changes  . Full dentures   . Acute MI     x3   Past Surgical History  Procedure Laterality Date  . Abdominal hysterectomy    . Neck surgery    . Cholecystectomy    . Elbow fracture surgery      right  . Wrist fracture surgery      left, has plate  .  Colonoscopy    . Nm myoview ltd  07/02/2013    Normal LV function, EF 83%. (Likely overestimated due to low volume);; no evidence of ischemia or infarction. Normal wall motion.  . Lower extremity arterial dopplers  07/06/2013    RABI 1.0, LABI 1.1.; No evidence of significant vascular atherogenic plaque  . Tonsillectomy    . Knee arthroscopy with excision plica Left 04/04/9469    Procedure: KNEE ARTHROSCOPY WITH EXCISION PLICA;  Surgeon: Marybelle Killings, MD;  Location: [facility];  Service: Orthopedics;  Laterality: Left;  . Chondroplasty Left 01/06/2015    Procedure: CHONDROPLASTY;  Surgeon: Marybelle Killings, MD;  Location: [facility];  Service: Orthopedics;  Laterality: Left;   Family History  Problem Relation Age of Onset  . Emphysema Father   . Colon cancer Neg Hx   . Diabetes Maternal Grandmother   . Diabetes Maternal Aunt   . Diabetes      mat. cousin  . Heart disease Brother   . Dementia Mother    History  Substance Use Topics  . Smoking status: Current Every Day Smoker -- 0.25 packs/day for 21 years    Types: Cigarettes  . Smokeless tobacco: Never Used     Comment: tobaccco info given 03/06/12  . Alcohol Use:  No   OB History    No data available     Review of Systems  Constitutional: Negative for fever.  Musculoskeletal: Positive for joint swelling, arthralgias and gait problem. Negative for back pain and neck pain.  Skin: Negative for color change and wound.      Allergies  Bee venom; Codeine; Ibuprofen; Propoxyphene n-acetaminophen; Toradol; Hydrocodone-acetaminophen; and Latex  Home Medications   Prior to Admission medications   Medication Sig Start Date End Date Taking? Authorizing Provider  ALPRAZolam Duanne Moron) 1 MG tablet Take 1 tablet by mouth as needed. 08/17/14   Historical Provider, MD  alprazolam Duanne Moron) 2 MG tablet Take 1 tablet by mouth as needed. 07/29/14   Historical Provider, MD  aspirin 81 MG tablet Take 81 mg by mouth  daily.    Historical Provider, MD  butalbital-acetaminophen-caffeine (FIORICET, ESGIC) 50-325-40 MG per tablet Take 1 tablet by mouth 2 (two) times daily as needed for headache.    Historical Provider, MD  dexlansoprazole (DEXILANT) 60 MG capsule Take 60 mg by mouth daily.    Historical Provider, MD  dicyclomine (BENTYL) 20 MG tablet Take 1 tablet by mouth daily. 08/15/14   Historical Provider, MD  diphenoxylate-atropine (LOMOTIL) 2.5-0.025 MG per tablet Take 1 tablet by mouth every 4 (four) hours as needed. 09/05/14   Historical Provider, MD  docusate sodium (COLACE) 100 MG capsule Take 100 mg by mouth 2 (two) times daily.    Historical Provider, MD  HYDROcodone-acetaminophen (NORCO) 7.5-325 MG per tablet Take 2 tablets by mouth every 4 (four) hours as needed for moderate pain or severe pain. 01/06/15   Marybelle Killings, MD  hydrOXYzine (ATARAX/VISTARIL) 25 MG tablet Take 1 tablet by mouth as needed. 08/17/14   Historical Provider, MD  ibuprofen (ADVIL,MOTRIN) 800 MG tablet Take 800 mg by mouth 3 (three) times daily as needed for mild pain or moderate pain.    Historical Provider, MD  ketoconazole (NIZORAL) 200 MG tablet Take 1 tablet (200 mg total) by mouth daily. 01/23/14   Harden Mo, MD  loperamide (IMODIUM) 2 MG capsule Take 1 capsule by mouth daily. 10/07/14   Historical Provider, MD  LORazepam (ATIVAN) 1 MG tablet Take 1 tablet (1 mg total) by mouth 3 (three) times daily as needed (dizziness / nausea). 07/20/14   Carmin Muskrat, MD  meclizine (ANTIVERT) 25 MG tablet Take 1 tablet by mouth daily. 09/30/14   Historical Provider, MD  mometasone-formoterol (DULERA) 200-5 MCG/ACT AERO Inhale 2 puffs into the lungs 2 (two) times daily.    Historical Provider, MD  montelukast (SINGULAIR) 10 MG tablet Take 10 mg by mouth at bedtime.    Historical Provider, MD  nitroGLYCERIN (NITROSTAT) 0.4 MG SL tablet Place 0.4 mg under the tongue every 5 (five) minutes as needed for chest pain.    Historical Provider, MD   omeprazole (PRILOSEC) 20 MG capsule Take 20 mg by mouth daily. 10/28/13   Historical Provider, MD  ondansetron (ZOFRAN) 4 MG tablet Take 4 mg by mouth every 6 (six) hours as needed. For nausea 10/18/13   Historical Provider, MD  oxybutynin (DITROPAN) 5 MG tablet Take 5 mg by mouth 2 (two) times daily.    Historical Provider, MD  oxyCODONE-acetaminophen (PERCOCET/ROXICET) 5-325 MG per tablet Take 2 tablets by mouth every 4 (four) hours as needed for severe pain. 03/11/15   Willer Osorno Patel-Mills, PA-C  PROAIR HFA 108 (90 BASE) MCG/ACT inhaler Inhale 1 mcg into the lungs 2 (two) times daily. 08/19/14   Historical Provider,  MD  promethazine (PHENERGAN) 25 MG tablet Take 1 tablet by mouth as needed. 06/20/14   Historical Provider, MD  promethazine-dextromethorphan (PROMETHAZINE-DM) 6.25-15 MG/5ML syrup Take 5 mLs by mouth 4 (four) times daily as needed for cough.    Historical Provider, MD  ranitidine (ZANTAC) 150 MG tablet Take 150 mg by mouth 2 (two) times daily.    Historical Provider, MD  triamcinolone cream (KENALOG) 0.1 % Apply 1 application topically 2 (two) times daily.    Historical Provider, MD  valACYclovir (VALTREX) 500 MG tablet Take 500 mg by mouth daily.    Historical Provider, MD   BP 122/59 mmHg  Pulse 82  Temp(Src) 98.3 F (36.8 C) (Oral)  Resp 16  SpO2 98% Physical Exam  Constitutional: She is oriented to person, place, and time. She appears well-developed and well-nourished.  HENT:  Head: Normocephalic and atraumatic.  Eyes: Conjunctivae are normal.  Neck: Normal range of motion.  Cardiovascular: Normal rate.   Pulmonary/Chest: Effort normal.  Musculoskeletal:  Left knee: Patient has moderate tenderness and minimal swelling over the medial knee and patella. She is able to straight leg raise but with pain. No calf tenderness. No joint effusion. No ecchymosis, erythema, abrasion or laceration of the knee. Good DP pulse. She is able to flex and extend toes and rotate ankle. NVI.   Neurological: She is alert and oriented to person, place, and time.  Skin: Skin is warm and dry.  Psychiatric: She has a normal mood and affect. Her behavior is normal.  Nursing note and vitals reviewed.   ED Course  Procedures (including critical care time) Labs Review Labs Reviewed - No data to display  Imaging Review Dg Knee Ap/lat W/sunrise Left  03/11/2015   CLINICAL DATA:  Left knee pain, no known injury, initial encounter  EXAM: LEFT KNEE 3 VIEWS  COMPARISON:  10/24/2014  FINDINGS: No acute fracture or dislocation is noted. Very mild patellofemoral spurring is seen. No joint effusion is noted.  IMPRESSION: Mild degenerative change in the patellofemoral space. No acute abnormality is noted.   Electronically Signed   By: Inez Catalina M.D.   On: 03/11/2015 12:02     EKG Interpretation None      MDM   Final diagnoses:  Knee pain, acute, left  Left knee x-ray is negative for acute fracture or dislocation. The patient has chronic left knee pain and ambulates with a cane. She can follow-up with Dr. Lorin Mercy on July 6 12th as scheduled or call and move her appointment up if possible. She can take ibuprofen or Tylenol for pain as she has been doing and Percocet for breakthrough pain. Patient was put in left knee immobilizer. Patient verbally agrees with the plan.    Ottie Glazier, PA-C 03/11/15 [place], DO 03/11/15 1523

## 2015-03-15 ENCOUNTER — Encounter: Payer: Self-pay | Admitting: Gastroenterology

## 2015-03-16 IMAGING — CT CT HEAD W/O CM
3 of 4 series · 15 of 30 positions shown, 18 images · non-contrast
Comparison: Plain films of cervical spine 05/05/2011

CT HEAD

CLINICAL DATA: Fall, leg weakness

CT HEAD WITHOUT CONTRAST
CT CERVICAL SPINE WITHOUT CONTRAST
TECHNIQUE: Multidetector CT imaging of the head and cervical spine
was performed following the standard protocol without intravenous
contrast.  Multiplanar CT image reconstructions of the cervical
spine were also generated.

[Series 6: axial reformats · axial · 0.23mm/px · z∈[-260,-113]mm · 9 of 98 slices shown, 12 images]
[im 10/98  brain]
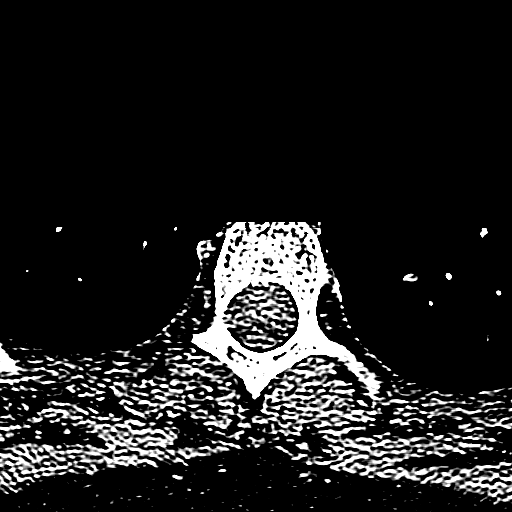
[im 10/98  bone]
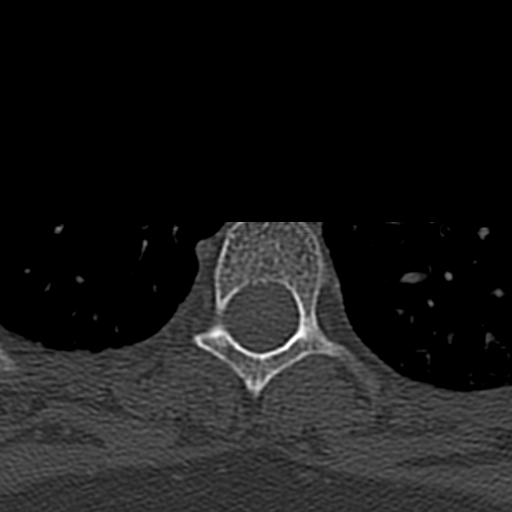
[im 20/98  brain]
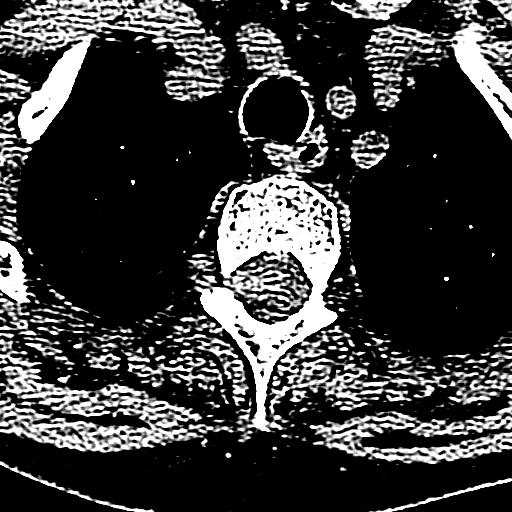
[im 30/98  brain]
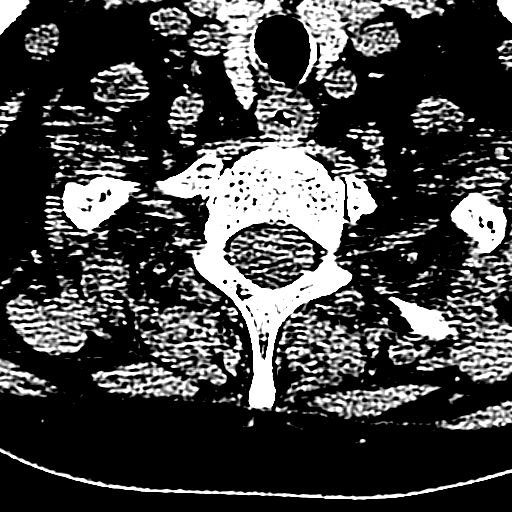
[im 39/98  brain]
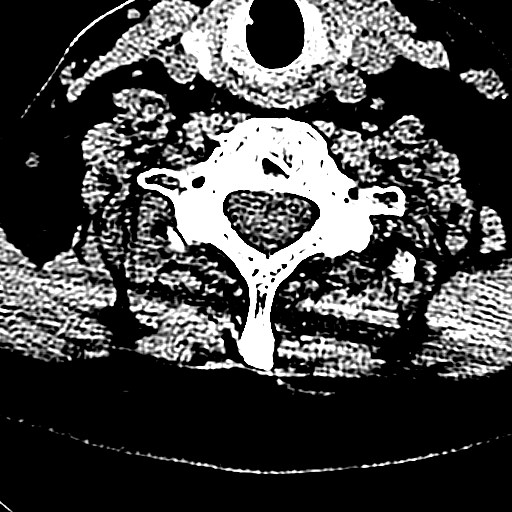
[im 49/98  brain]
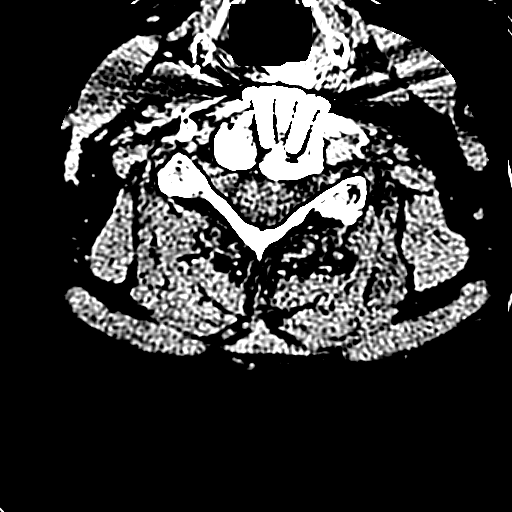
[im 49/98  bone]
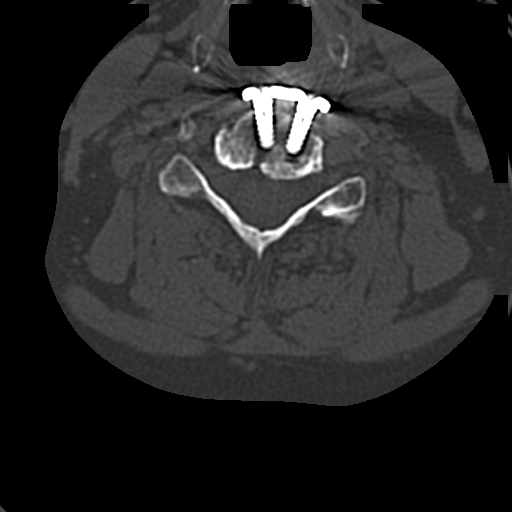
[im 59/98  brain]
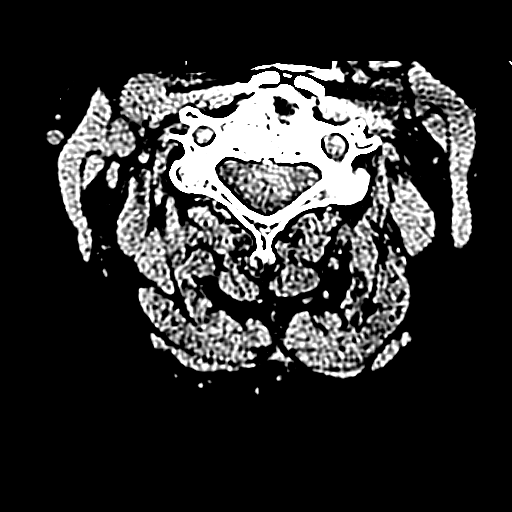
[im 68/98  brain]
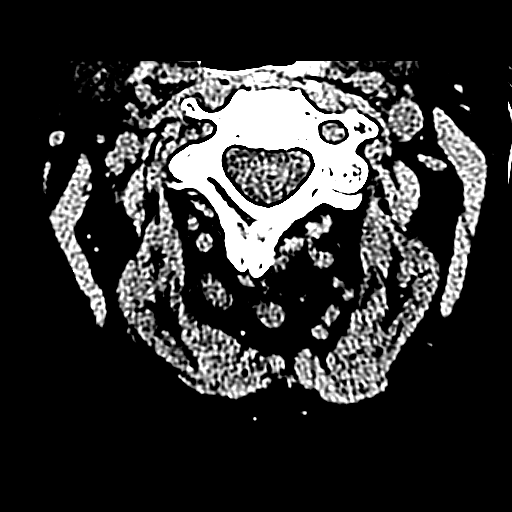
[im 78/98  brain]
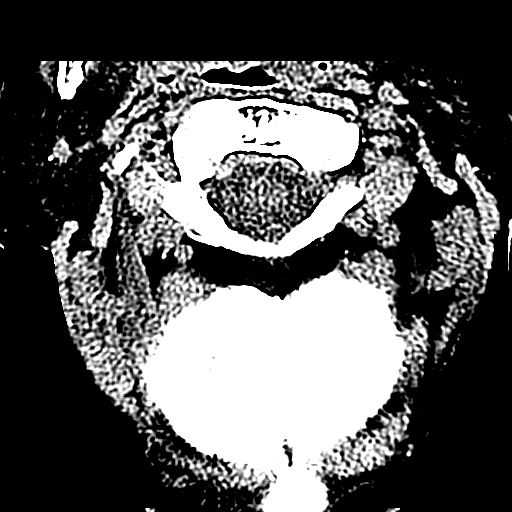
[im 88/98  brain]
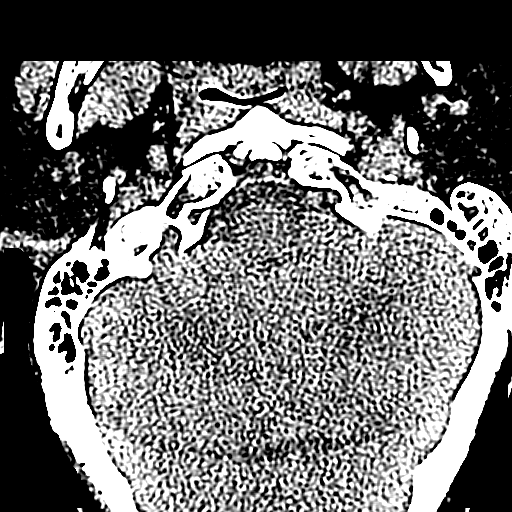
[im 88/98  bone]
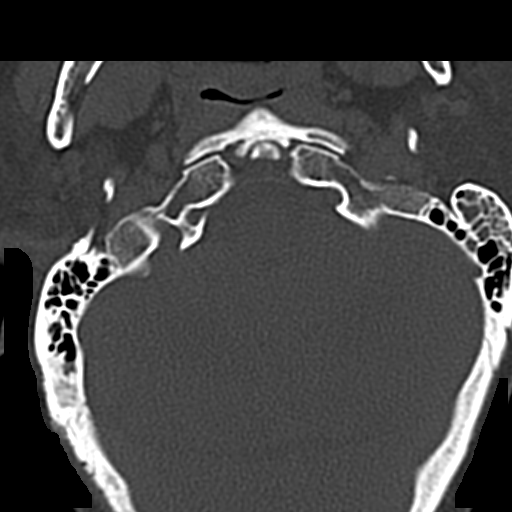

[Series 9: head w/o · axial · non-contrast · 0.38mm/px · z∈[-51,-1]mm · 2 of 32 slices shown]
[im 11/32  brain]
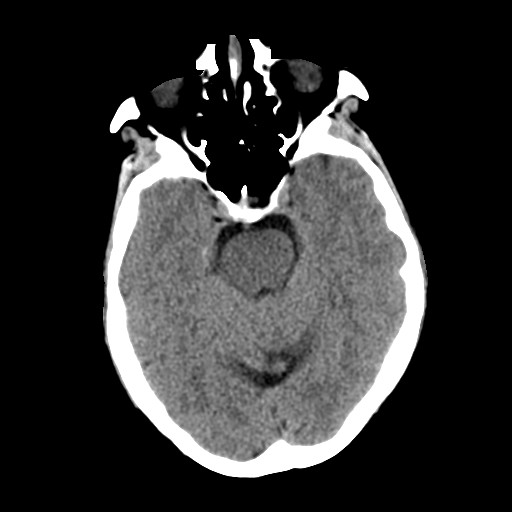
[im 21/32  brain]
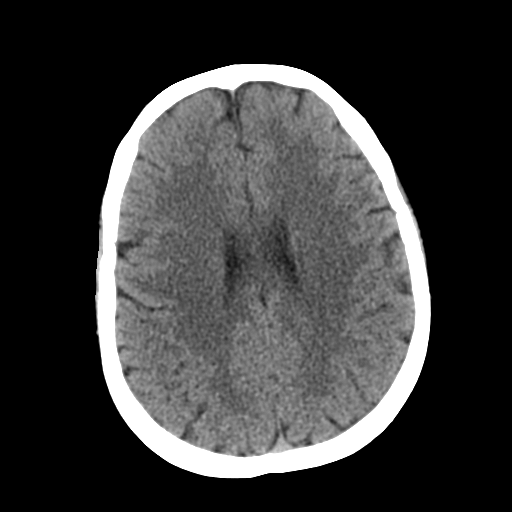

[Series 10: bone windows · axial · 0.38mm/px · z∈[-71,+22]mm · 4 of 53 slices shown]
[im 11/53  bone]
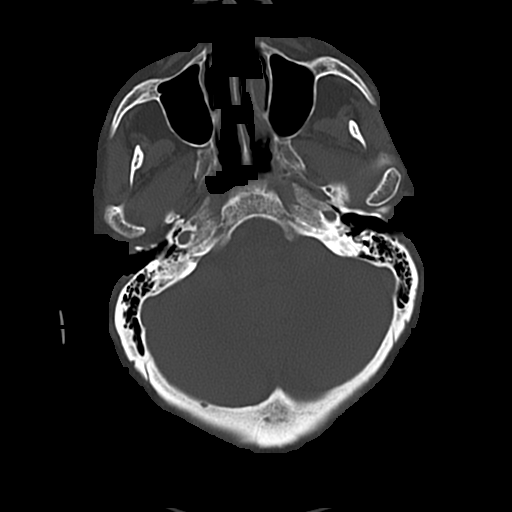
[im 21/53  bone]
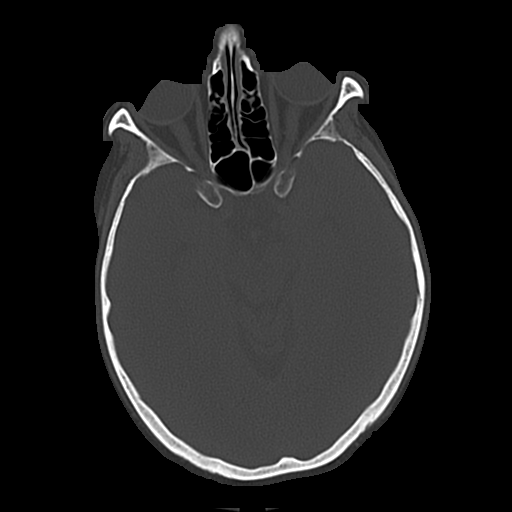
[im 32/53  bone]
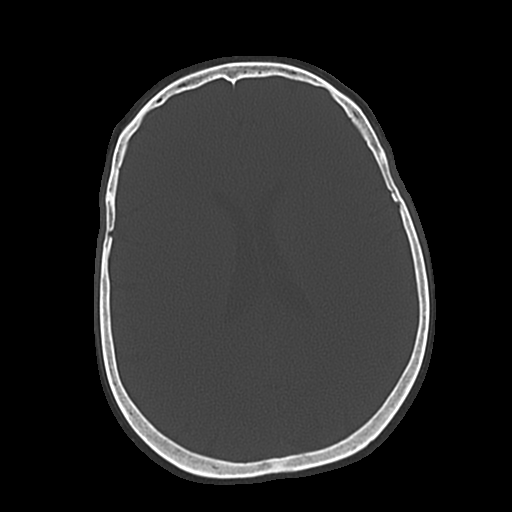
[im 42/53  bone]
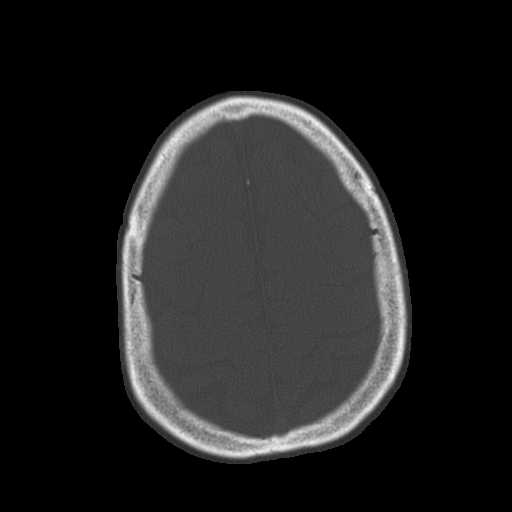

[15 of 30 positions shown; findings below may reference images not displayed]

FINDINGS: No acute intracranial hemorrhage.  No focal mass lesion.
No CT evidence of acute infarction.   No midline shift or mass
effect.  No hydrocephalus.  Basilar cisterns are patent.

There is scattered opacification of the ethmoid air cells.
Paranasal sinuses are clear.  Frontal sinuses clear.  No skull
fracture.
IMPRESSION: No intracranial trauma.

Fluid in the mastoid air cells.  No skull fracture.

CT CERVICAL SPINE
FINDINGS: There is anterior cervical fusion from C3-C5.  There is a
bony bridging of the cervical spine from C3-C7.  No evidence of
acute fracture.  Normal facet articulation.  Normal craniocervical
junction.

There is no prevertebral soft tissue swelling.  No evidence of
epidural or paraspinal hematoma.
IMPRESSION: 1.  No evidence for cervical spine fracture.
2.  Stable anterior cervical fusion.

## 2015-03-24 ENCOUNTER — Other Ambulatory Visit (HOSPITAL_COMMUNITY): Payer: Self-pay | Admitting: Specialist

## 2015-03-24 DIAGNOSIS — Z1231 Encounter for screening mammogram for malignant neoplasm of breast: Secondary | ICD-10-CM

## 2015-04-07 ENCOUNTER — Emergency Department (HOSPITAL_COMMUNITY): Payer: Medicare Other

## 2015-04-07 ENCOUNTER — Emergency Department (HOSPITAL_COMMUNITY)
Admission: EM | Admit: 2015-04-07 | Discharge: 2015-04-07 | Disposition: A | Discharge status: Home / Self Care | Payer: Medicare Other | Attending: Emergency Medicine | Admitting: Emergency Medicine

## 2015-04-07 ENCOUNTER — Encounter (HOSPITAL_COMMUNITY): Payer: Self-pay | Admitting: *Deleted

## 2015-04-07 DIAGNOSIS — F329 Major depressive disorder, single episode, unspecified: Secondary | ICD-10-CM | POA: Insufficient documentation

## 2015-04-07 DIAGNOSIS — W01198A Fall on same level from slipping, tripping and stumbling with subsequent striking against other object, initial encounter: Secondary | ICD-10-CM | POA: Insufficient documentation

## 2015-04-07 DIAGNOSIS — K2270 Barrett's esophagus without dysplasia: Secondary | ICD-10-CM | POA: Diagnosis not present

## 2015-04-07 DIAGNOSIS — Z98811 Dental restoration status: Secondary | ICD-10-CM | POA: Insufficient documentation

## 2015-04-07 DIAGNOSIS — Z7982 Long term (current) use of aspirin: Secondary | ICD-10-CM | POA: Diagnosis not present

## 2015-04-07 DIAGNOSIS — Z7952 Long term (current) use of systemic steroids: Secondary | ICD-10-CM | POA: Insufficient documentation

## 2015-04-07 DIAGNOSIS — Z9889 Other specified postprocedural states: Secondary | ICD-10-CM | POA: Insufficient documentation

## 2015-04-07 DIAGNOSIS — F419 Anxiety disorder, unspecified: Secondary | ICD-10-CM | POA: Diagnosis not present

## 2015-04-07 DIAGNOSIS — Z7951 Long term (current) use of inhaled steroids: Secondary | ICD-10-CM | POA: Insufficient documentation

## 2015-04-07 DIAGNOSIS — J449 Chronic obstructive pulmonary disease, unspecified: Secondary | ICD-10-CM | POA: Diagnosis not present

## 2015-04-07 DIAGNOSIS — M5489 Other dorsalgia: Secondary | ICD-10-CM

## 2015-04-07 DIAGNOSIS — S0990XA Unspecified injury of head, initial encounter: Secondary | ICD-10-CM

## 2015-04-07 DIAGNOSIS — Z91040 Latex allergy status: Secondary | ICD-10-CM | POA: Diagnosis not present

## 2015-04-07 DIAGNOSIS — M25561 Pain in right knee: Secondary | ICD-10-CM

## 2015-04-07 DIAGNOSIS — S8991XA Unspecified injury of right lower leg, initial encounter: Secondary | ICD-10-CM | POA: Insufficient documentation

## 2015-04-07 DIAGNOSIS — W19XXXA Unspecified fall, initial encounter: Secondary | ICD-10-CM

## 2015-04-07 DIAGNOSIS — Y92090 Kitchen in other non-institutional residence as the place of occurrence of the external cause: Secondary | ICD-10-CM | POA: Diagnosis not present

## 2015-04-07 DIAGNOSIS — S3992XA Unspecified injury of lower back, initial encounter: Secondary | ICD-10-CM | POA: Insufficient documentation

## 2015-04-07 DIAGNOSIS — I252 Old myocardial infarction: Secondary | ICD-10-CM | POA: Insufficient documentation

## 2015-04-07 DIAGNOSIS — Z720 Tobacco use: Secondary | ICD-10-CM | POA: Insufficient documentation

## 2015-04-07 DIAGNOSIS — Z79899 Other long term (current) drug therapy: Secondary | ICD-10-CM | POA: Insufficient documentation

## 2015-04-07 DIAGNOSIS — Y93G9 Activity, other involving cooking and grilling: Secondary | ICD-10-CM | POA: Diagnosis not present

## 2015-04-07 DIAGNOSIS — K219 Gastro-esophageal reflux disease without esophagitis: Secondary | ICD-10-CM | POA: Diagnosis not present

## 2015-04-07 DIAGNOSIS — M542 Cervicalgia: Secondary | ICD-10-CM

## 2015-04-07 DIAGNOSIS — Y998 Other external cause status: Secondary | ICD-10-CM | POA: Diagnosis not present

## 2015-04-07 MED ORDER — ONDANSETRON HCL 4 MG/2ML IJ SOLN
4.0000 mg | Freq: Once | INTRAMUSCULAR | Status: AC
  Administered 2015-04-07: 4 mg via INTRAVENOUS
  Filled 2015-04-07: qty 2

## 2015-04-07 MED ORDER — OXYCODONE-ACETAMINOPHEN 5-325 MG PO TABS: 1.0000 | ORAL_TABLET | Freq: Four times a day (QID) | ORAL | Status: DC | PRN

## 2015-04-07 MED ORDER — HYDROMORPHONE HCL 1 MG/ML IJ SOLN
1.0000 mg | Freq: Once | INTRAMUSCULAR | Status: AC
  Administered 2015-04-07: 1 mg via INTRAVENOUS
  Filled 2015-04-07: qty 1

## 2015-04-07 NOTE — ED Notes (Signed)
Bed: JD05 Expected date:  Expected time:  Means of arrival:  Comments: 59yo F fall, loc, pain

## 2015-04-07 NOTE — ED Notes (Signed)
Patient transported to CT 

## 2015-04-07 NOTE — ED Notes (Signed)
EMS contacted by patients spouse. He reports finding pt on the floor. When speaking to pt she reports becoming dizzy and falling down, she reports LOC. Trauma assessment completed, pt c/o pain all over, no obvious injuries. Fentanyl 150 mcg and Zofran 4mg  given in field. Pt continues to have pain.

## 2015-04-07 NOTE — Discharge Instructions (Signed)
Return to the ED with any concerns including weakness of arms or legs, vomiting, seizure activity, decreased level of alertness/lethargy, or any other alarming symptoms

## 2015-04-07 NOTE — ED Provider Notes (Signed)
CSN: [id number]     Arrival date & time 04/07/15  1425 History   First MD Initiated Contact with Patient 04/07/15 1501     Chief Complaint  Patient presents with  . Fall     (Consider location/radiation/quality/duration/timing/severity/associated sxs/prior Treatment) HPI  Pt presents after fall.  Pt states she has been having problems with her left knee- she had surgery in April 2016 and since then her knee has continued to have pain and feels weak.  She states she was cooking today in the kitchen,her left knee gave out on her and she hit her head on the refrigerator, states she lost consciousness after hitting her head.  She now c/o full body pain.  Primarily head, neck and back pain.  She received fentanly via EMS and is requesting more pain medication.  She is a poor historian and is difficult to get answers to my questions.  She is very focused on diffuse body pain and is not able to localize this pain very well.  Pain is worse with movement and palpation.  There are no other associated systemic symptoms, there are no other alleviating or modifying factors.   Past Medical History  Diagnosis Date  . COPD (chronic obstructive pulmonary disease) with chronic bronchitis     & Emphysema  . Asthma   . Barrett esophagus 2007  . Hiatal hernia [id number]  . Duodenitis without mention of hemorrhage 2007  . Esophageal stricture   . Esophageal reflux 2007  . Anxiety   . Depression   . Multiple fractures     from falls, fx rt. elbow, fx left wrist, bilateral ankles  . Allergy   . Ulcer   . H/O hiatal hernia   . Chest pain 05-02-2009    echo  EF 55%  . H/O cardiovascular stress test 05-02-2009    low risk scan, neg. for ischemia, no ekg changes  . Full dentures   . Acute MI     x3   Past Surgical History  Procedure Laterality Date  . Abdominal hysterectomy    . Neck surgery    . Cholecystectomy    . Elbow fracture surgery      right  . Wrist fracture surgery      left, has plate  .  Colonoscopy    . Nm myoview ltd  07/02/2013    Normal LV function, EF 83%. (Likely overestimated due to low volume);; no evidence of ischemia or infarction. Normal wall motion.  . Lower extremity arterial dopplers  07/06/2013    RABI 1.0, LABI 1.1.; No evidence of significant vascular atherogenic plaque  . Tonsillectomy    . Knee arthroscopy with excision plica Left 11/14/6008    Procedure: KNEE ARTHROSCOPY WITH EXCISION PLICA;  Surgeon: Marybelle Killings, MD;  Location: [facility];  Service: Orthopedics;  Laterality: Left;  . Chondroplasty Left 01/06/2015    Procedure: CHONDROPLASTY;  Surgeon: Marybelle Killings, MD;  Location: [facility];  Service: Orthopedics;  Laterality: Left;   Family History  Problem Relation Age of Onset  . Emphysema Father   . Colon cancer Neg Hx   . Diabetes Maternal Grandmother   . Diabetes Maternal Aunt   . Diabetes      mat. cousin  . Heart disease Brother   . Dementia Mother    History  Substance Use Topics  . Smoking status: Current Every Day Smoker -- 0.25 packs/day for 21 years    Types: Cigarettes  . Smokeless  tobacco: Never Used     Comment: tobaccco info given 03/06/12  . Alcohol Use: No   OB History    No data available     Review of Systems  ROS reviewed and all otherwise negative except for mentioned in HPI    Allergies  Bee venom; Codeine; Ibuprofen; Propoxyphene n-acetaminophen; Toradol; Hydrocodone-acetaminophen; and Latex  Home Medications   Prior to Admission medications   Medication Sig Start Date End Date Taking? Authorizing Provider  LORazepam (ATIVAN) 1 MG tablet Take 1 tablet (1 mg total) by mouth 3 (three) times daily as needed (dizziness / nausea). 07/20/14  Yes Carmin Muskrat, MD  ALPRAZolam Duanne Moron) 1 MG tablet Take 1 tablet by mouth as needed. 08/17/14   Historical Provider, MD  alprazolam Duanne Moron) 2 MG tablet Take 1 tablet by mouth as needed. 07/29/14   Historical Provider, MD  aspirin 81 MG tablet  Take 81 mg by mouth daily.    Historical Provider, MD  butalbital-acetaminophen-caffeine (FIORICET, ESGIC) 50-325-40 MG per tablet Take 1 tablet by mouth 2 (two) times daily as needed for headache.    Historical Provider, MD  dexlansoprazole (DEXILANT) 60 MG capsule Take 60 mg by mouth daily.    Historical Provider, MD  dicyclomine (BENTYL) 20 MG tablet Take 1 tablet by mouth daily. 08/15/14   Historical Provider, MD  diphenoxylate-atropine (LOMOTIL) 2.5-0.025 MG per tablet Take 1 tablet by mouth every 4 (four) hours as needed. 09/05/14   Historical Provider, MD  docusate sodium (COLACE) 100 MG capsule Take 100 mg by mouth 2 (two) times daily.    Historical Provider, MD  HYDROcodone-acetaminophen (NORCO) 7.5-325 MG per tablet Take 2 tablets by mouth every 4 (four) hours as needed for moderate pain or severe pain. 01/06/15   Marybelle Killings, MD  hydrOXYzine (ATARAX/VISTARIL) 25 MG tablet Take 1 tablet by mouth as needed. 08/17/14   Historical Provider, MD  ibuprofen (ADVIL,MOTRIN) 800 MG tablet Take 800 mg by mouth 3 (three) times daily as needed for mild pain or moderate pain.    Historical Provider, MD  ketoconazole (NIZORAL) 200 MG tablet Take 1 tablet (200 mg total) by mouth daily. 01/23/14   Harden Mo, MD  loperamide (IMODIUM) 2 MG capsule Take 1 capsule by mouth daily. 10/07/14   Historical Provider, MD  meclizine (ANTIVERT) 25 MG tablet Take 1 tablet by mouth daily. 09/30/14   Historical Provider, MD  mometasone-formoterol (DULERA) 200-5 MCG/ACT AERO Inhale 2 puffs into the lungs 2 (two) times daily.    Historical Provider, MD  montelukast (SINGULAIR) 10 MG tablet Take 10 mg by mouth at bedtime.    Historical Provider, MD  nitroGLYCERIN (NITROSTAT) 0.4 MG SL tablet Place 0.4 mg under the tongue every 5 (five) minutes as needed for chest pain.    Historical Provider, MD  omeprazole (PRILOSEC) 20 MG capsule Take 20 mg by mouth daily. 10/28/13   Historical Provider, MD  ondansetron (ZOFRAN) 4 MG  tablet Take 4 mg by mouth every 6 (six) hours as needed. For nausea 10/18/13   Historical Provider, MD  oxybutynin (DITROPAN) 5 MG tablet Take 5 mg by mouth 2 (two) times daily.    Historical Provider, MD  oxyCODONE-acetaminophen (PERCOCET/ROXICET) 5-325 MG per tablet Take 1-2 tablets by mouth every 6 (six) hours as needed for severe pain. 04/07/15   Alfonzo Beers, MD  PROAIR HFA 108 (90 BASE) MCG/ACT inhaler Inhale 1 mcg into the lungs 2 (two) times daily. 08/19/14   Historical Provider, MD  promethazine (PHENERGAN)  25 MG tablet Take 1 tablet by mouth as needed. 06/20/14   Historical Provider, MD  promethazine-dextromethorphan (PROMETHAZINE-DM) 6.25-15 MG/5ML syrup Take 5 mLs by mouth 4 (four) times daily as needed for cough.    Historical Provider, MD  ranitidine (ZANTAC) 150 MG tablet Take 150 mg by mouth 2 (two) times daily.    Historical Provider, MD  triamcinolone cream (KENALOG) 0.1 % Apply 1 application topically 2 (two) times daily.    Historical Provider, MD  valACYclovir (VALTREX) 500 MG tablet Take 500 mg by mouth daily.    Historical Provider, MD   BP 92/64 mmHg  Pulse 80  Temp(Src) 97.8 F (36.6 C) (Oral)  Resp 16  SpO2 95%  Vitals reviewed Physical Exam  Physical Examination: General appearance - alert, chronically appearing, and in no distress Mental status - alert, oriented to person, place, and time Head- NCAT Eyes - no conjunctival injection, no scleral icterus Neck- c-collar in place, diffuse ttp over posterior cervical spine Mouth - mucous membranes moist, pharynx normal without lesions Chest - clear to auscultation, no wheezes, rales or rhonchi, symmetric air entry Heart - normal rate, regular rhythm, normal S1, S2, no murmurs, rubs, clicks or gallops Abdomen - soft, nontender, nondistended, no masses or organomegaly Back- midline tenderness to palpation of lumbar spine, no CVA tenderness Neurological - alert, oriented, normal speech, moving all extremities Extremities -  peripheral pulses normal, no pedal edema, no clubbing or cyanosis MS- ttp diffusely over arms and legs no specific point tenderness Skin - normal coloration and turgor, no rashes  ED Course  Procedures (including critical care time) Labs Review Labs Reviewed - No data to display  Imaging Review No results found.   EKG Interpretation None      MDM   Final diagnoses:  Fall  Head injury, initial encounter  Neck pain  Back pain without sciatica  Knee pain, right    Pt has continued knee pain and she states this caused her to be unsteady on her feet and fall.  She states she had LOC after striking her head.  She c/o full body pain and it is difficult to localize any specific area of bony injury on her exam.  Head CT and cspine ct are negative.  Lumbar spine films are negative.   Xray images reviewed and interpreted by me as well.   Pt feels improved after pain meds in the Ed.  She was advised to f/u as advised by Dr. Lorin Mercy, patient states he is planning to arrange for physical therapy.  Discharged with strict return precautions.  Pt agreeable with plan.  Prior records reviewed and considered during this visit Nursing notes including past medical history and social history reviewed and considered in documentation     Alfonzo Beers, MD 04/09/15 2355

## 2015-04-14 ENCOUNTER — Ambulatory Visit: Payer: Medicare Other | Admitting: Physical Therapy

## 2015-04-18 ENCOUNTER — Encounter (HOSPITAL_COMMUNITY): Payer: Self-pay

## 2015-04-18 ENCOUNTER — Emergency Department (HOSPITAL_COMMUNITY)
Admission: EM | Admit: 2015-04-18 | Discharge: 2015-04-19 | Disposition: A | Discharge status: Home / Self Care | Payer: Medicare Other | Attending: Emergency Medicine | Admitting: Emergency Medicine

## 2015-04-18 DIAGNOSIS — W1839XA Other fall on same level, initial encounter: Secondary | ICD-10-CM | POA: Insufficient documentation

## 2015-04-18 DIAGNOSIS — Z79899 Other long term (current) drug therapy: Secondary | ICD-10-CM | POA: Insufficient documentation

## 2015-04-18 DIAGNOSIS — Y999 Unspecified external cause status: Secondary | ICD-10-CM | POA: Insufficient documentation

## 2015-04-18 DIAGNOSIS — Y939 Activity, unspecified: Secondary | ICD-10-CM | POA: Diagnosis not present

## 2015-04-18 DIAGNOSIS — R0789 Other chest pain: Secondary | ICD-10-CM

## 2015-04-18 DIAGNOSIS — Y929 Unspecified place or not applicable: Secondary | ICD-10-CM | POA: Diagnosis not present

## 2015-04-18 DIAGNOSIS — Z91040 Latex allergy status: Secondary | ICD-10-CM | POA: Diagnosis not present

## 2015-04-18 DIAGNOSIS — F419 Anxiety disorder, unspecified: Secondary | ICD-10-CM | POA: Diagnosis not present

## 2015-04-18 DIAGNOSIS — Z720 Tobacco use: Secondary | ICD-10-CM | POA: Insufficient documentation

## 2015-04-18 DIAGNOSIS — J449 Chronic obstructive pulmonary disease, unspecified: Secondary | ICD-10-CM | POA: Diagnosis not present

## 2015-04-18 DIAGNOSIS — F329 Major depressive disorder, single episode, unspecified: Secondary | ICD-10-CM | POA: Insufficient documentation

## 2015-04-18 DIAGNOSIS — K219 Gastro-esophageal reflux disease without esophagitis: Secondary | ICD-10-CM | POA: Insufficient documentation

## 2015-04-18 DIAGNOSIS — S299XXA Unspecified injury of thorax, initial encounter: Secondary | ICD-10-CM | POA: Diagnosis not present

## 2015-04-18 DIAGNOSIS — I252 Old myocardial infarction: Secondary | ICD-10-CM | POA: Diagnosis not present

## 2015-04-18 DIAGNOSIS — Z7982 Long term (current) use of aspirin: Secondary | ICD-10-CM | POA: Diagnosis not present

## 2015-04-18 NOTE — ED Notes (Addendum)
Pt transported by [facility] for persistent right flank pain since fall on 7/22.  Pt was seen here after fall and had negative lumbar XR and head CT.  Pt denies new injury.

## 2015-04-18 NOTE — ED Notes (Signed)
Bed: OE78 Expected date:  Expected time:  Means of arrival:  Comments: 59 yo F  Right flank pain s/p fall last Saturday

## 2015-04-19 ENCOUNTER — Emergency Department (HOSPITAL_COMMUNITY): Payer: Medicare Other

## 2015-04-19 DIAGNOSIS — S299XXA Unspecified injury of thorax, initial encounter: Secondary | ICD-10-CM | POA: Diagnosis not present

## 2015-04-19 MED ORDER — HYDROMORPHONE HCL 1 MG/ML IJ SOLN
0.5000 mg | Freq: Once | INTRAMUSCULAR | Status: AC
  Administered 2015-04-19: 0.5 mg via INTRAMUSCULAR
  Filled 2015-04-19: qty 1

## 2015-04-19 MED ORDER — DIAZEPAM 5 MG PO TABS
5.0000 mg | ORAL_TABLET | Freq: Once | ORAL | Status: AC
  Administered 2015-04-19: 5 mg via ORAL
  Filled 2015-04-19: qty 1

## 2015-04-19 NOTE — ED Provider Notes (Signed)
CSN: [id number]     Arrival date & time 04/18/15  2339 History   First MD Initiated Contact with Patient 04/19/15 0033     Chief Complaint  Patient presents with  . Fall    Fall on 7/22 - persistent right flank pain.      (Consider location/radiation/quality/duration/timing/severity/associated sxs/prior Treatment) HPI Lori [name] is a 59 y.o. female with history of a fall 2 weeks ago, presents to emergency department complaining of continued pain. Patient states chest pain to the right side of the ribs. She was seen in emergency department at time, had lumbar film and CT of the head which both were negative. Patient was evaluated by primary care doctor since thenprescribed Percocet for her pain. Patient states she is taking Percocet but it is not helping her pain. Patient denies any shortness of breath. She states she has pain with coughing and deep breathing. She denies pain radiating down her legs or into her arms. She denies any numbness or weakness in arms or legs. She denies any abdominal pain.    Past Medical History  Diagnosis Date  . COPD (chronic obstructive pulmonary disease) with chronic bronchitis     & Emphysema  . Asthma   . Barrett esophagus 2007  . Hiatal hernia [id number]  . Duodenitis without mention of hemorrhage 2007  . Esophageal stricture   . Esophageal reflux 2007  . Anxiety   . Depression   . Multiple fractures     from falls, fx rt. elbow, fx left wrist, bilateral ankles  . Allergy   . Ulcer   . H/O hiatal hernia   . Chest pain 05-02-2009    echo  EF 55%  . H/O cardiovascular stress test 05-02-2009    low risk scan, neg. for ischemia, no ekg changes  . Full dentures   . Acute MI     x3   Past Surgical History  Procedure Laterality Date  . Abdominal hysterectomy    . Neck surgery    . Cholecystectomy    . Elbow fracture surgery      right  . Wrist fracture surgery      left, has plate  . Colonoscopy    . Nm myoview ltd  07/02/2013    Normal  LV function, EF 83%. (Likely overestimated due to low volume);; no evidence of ischemia or infarction. Normal wall motion.  . Lower extremity arterial dopplers  07/06/2013    RABI 1.0, LABI 1.1.; No evidence of significant vascular atherogenic plaque  . Tonsillectomy    . Knee arthroscopy with excision plica Left 1/51/7616    Procedure: KNEE ARTHROSCOPY WITH EXCISION PLICA;  Surgeon: Marybelle Killings, MD;  Location: [place];  Service: Orthopedics;  Laterality: Left;  . Chondroplasty Left 01/06/2015    Procedure: CHONDROPLASTY;  Surgeon: Marybelle Killings, MD;  Location: [place];  Service: Orthopedics;  Laterality: Left;   Family History  Problem Relation Age of Onset  . Emphysema Father   . Colon cancer Neg Hx   . Diabetes Maternal Grandmother   . Diabetes Maternal Aunt   . Diabetes      mat. cousin  . Heart disease Brother   . Dementia Mother    History  Substance Use Topics  . Smoking status: Current Every Day Smoker -- 0.25 packs/day for 21 years    Types: Cigarettes  . Smokeless tobacco: Never Used     Comment: tobaccco info given 03/06/12  . Alcohol Use:  No   OB History    No data available     Review of Systems  Constitutional: Negative for fever and chills.  Respiratory: Negative for cough, chest tightness and shortness of breath.   Cardiovascular: Positive for chest pain. Negative for palpitations and leg swelling.  Gastrointestinal: Negative for nausea, vomiting, abdominal pain and diarrhea.  Genitourinary: Negative for dysuria and flank pain.  Musculoskeletal: Negative for myalgias, arthralgias, neck pain and neck stiffness.  Skin: Negative for rash.  Neurological: Negative for dizziness, weakness and headaches.  All other systems reviewed and are negative.     Allergies  Bee venom; Codeine; Ibuprofen; Propoxyphene n-acetaminophen; Toradol; Hydrocodone-acetaminophen; and Latex  Home Medications   Prior to Admission medications    Medication Sig Start Date End Date Taking? Authorizing Provider  ALPRAZolam Duanne Moron) 1 MG tablet Take 1 tablet by mouth as needed. 08/17/14  Yes Historical Provider, MD  aspirin 81 MG tablet Take 81 mg by mouth daily.   Yes Historical Provider, MD  butalbital-acetaminophen-caffeine (FIORICET, ESGIC) 50-325-40 MG per tablet Take 1 tablet by mouth 2 (two) times daily as needed for headache.   Yes Historical Provider, MD  dexlansoprazole (DEXILANT) 60 MG capsule Take 60 mg by mouth daily.   Yes Historical Provider, MD  diphenoxylate-atropine (LOMOTIL) 2.5-0.025 MG per tablet Take 1 tablet by mouth every 4 (four) hours as needed for diarrhea or loose stools.  09/05/14  Yes Historical Provider, MD  docusate sodium (COLACE) 100 MG capsule Take 100 mg by mouth 2 (two) times daily.   Yes Historical Provider, MD  hydrOXYzine (ATARAX/VISTARIL) 25 MG tablet Take 1 tablet by mouth as needed for anxiety.  08/17/14  Yes Historical Provider, MD  ibuprofen (ADVIL,MOTRIN) 800 MG tablet Take 800 mg by mouth 3 (three) times daily as needed for mild pain or moderate pain.   Yes Historical Provider, MD  loperamide (IMODIUM) 2 MG capsule Take 1 capsule by mouth daily. 10/07/14  Yes Historical Provider, MD  meclizine (ANTIVERT) 25 MG tablet Take 1 tablet by mouth daily. 09/30/14  Yes Historical Provider, MD  mometasone-formoterol (DULERA) 200-5 MCG/ACT AERO Inhale 2 puffs into the lungs 2 (two) times daily.   Yes Historical Provider, MD  montelukast (SINGULAIR) 10 MG tablet Take 10 mg by mouth 2 (two) times daily.    Yes Historical Provider, MD  nitroGLYCERIN (NITROSTAT) 0.4 MG SL tablet Place 0.4 mg under the tongue every 5 (five) minutes as needed for chest pain.   Yes Historical Provider, MD  ondansetron (ZOFRAN) 4 MG tablet Take 4 mg by mouth every 6 (six) hours as needed. For nausea 10/18/13  Yes Historical Provider, MD  oxybutynin (DITROPAN) 5 MG tablet Take 5 mg by mouth 2 (two) times daily.   Yes Historical Provider, MD   PROAIR HFA 108 (90 BASE) MCG/ACT inhaler Inhale 1 mcg into the lungs 2 (two) times daily. 08/19/14  Yes Historical Provider, MD  promethazine (PHENERGAN) 25 MG tablet Take 1 tablet by mouth as needed for nausea.  06/20/14  Yes Historical Provider, MD  promethazine-dextromethorphan (PROMETHAZINE-DM) 6.25-15 MG/5ML syrup Take 5 mLs by mouth 4 (four) times daily as needed for cough.   Yes Historical Provider, MD  ranitidine (ZANTAC) 150 MG tablet Take 150 mg by mouth 2 (two) times daily.   Yes Historical Provider, MD  valACYclovir (VALTREX) 500 MG tablet Take 500 mg by mouth daily.   Yes Historical Provider, MD  dicyclomine (BENTYL) 20 MG tablet Take 1 tablet by mouth daily. 08/15/14  Historical Provider, MD  HYDROcodone-acetaminophen (NORCO) 7.5-325 MG per tablet Take 2 tablets by mouth every 4 (four) hours as needed for moderate pain or severe pain. Patient not taking: Reported on 04/18/2015 01/06/15   Marybelle Killings, MD  ketoconazole (NIZORAL) 200 MG tablet Take 1 tablet (200 mg total) by mouth daily. Patient not taking: Reported on 04/18/2015 01/23/14   Harden Mo, MD  LORazepam (ATIVAN) 1 MG tablet Take 1 tablet (1 mg total) by mouth 3 (three) times daily as needed (dizziness / nausea). Patient not taking: Reported on 04/19/2015 07/20/14   Carmin Muskrat, MD  oxyCODONE-acetaminophen (PERCOCET/ROXICET) 5-325 MG per tablet Take 1-2 tablets by mouth every 6 (six) hours as needed for severe pain. Patient not taking: Reported on 04/19/2015 04/07/15   Alfonzo Beers, MD   BP 122/75 mmHg  Pulse 76  Temp(Src) 97.8 F (36.6 C) (Oral)  Resp 18  Ht 5\' 4"  (1.626 m)  Wt 128 lb (58.06 kg)  BMI 21.96 kg/m2  SpO2 97% Physical Exam  Constitutional: She appears well-developed and well-nourished.  Pt moaning in pain  HENT:  Head: Normocephalic.  Eyes: Conjunctivae are normal.  Neck: Neck supple.  Cardiovascular: Normal rate, regular rhythm and normal heart sounds.   Pulmonary/Chest: Effort normal and breath  sounds normal. No respiratory distress. She has no wheezes. She has no rales. She exhibits tenderness.  Right ribs tenderness, no bruising noted. No deformities.  Abdominal: Soft. Bowel sounds are normal. She exhibits no distension. There is no tenderness. There is no rebound.  Musculoskeletal: She exhibits no edema.  Neurological: She is alert.  Skin: Skin is warm and dry.  Psychiatric: She has a normal mood and affect. Her behavior is normal.  Nursing note and vitals reviewed.   ED Course  Procedures (including critical care time) Labs Review Labs Reviewed - No data to display  Imaging Review No results found.   EKG Interpretation None      MDM   Final diagnoses:  None   patient is here after a fall 2 weeks ago. Complaining of right lower back and right rib pain. Had lumbar films done at time of a fall and are negative. Will add chest x-ray with ribs. Dilaudid 0.5mg  IM ordered.   Patient continues to have pain. X-ray results pending. Patient signed out to be up still at shift change. Patient was seen ambulating in the hallway with no difficulties.   Jeannett Senior, PA-C 04/21/15 [place], DO 04/21/15 1553

## 2015-04-19 NOTE — Discharge Instructions (Signed)
Chest Wall Pain Chest wall pain is pain in or around the bones and muscles of your chest. It may take up to 6 weeks to get better. It may take longer if you must stay physically active in your work and activities.  CAUSES  Chest wall pain may happen on its own. However, it may be caused by:  A viral illness like the flu.  Injury.  Coughing.  Exercise.  Arthritis.  Fibromyalgia.  Shingles. HOME CARE INSTRUCTIONS   Avoid overtiring physical activity. Try not to strain or perform activities that cause pain. This includes any activities using your chest or your abdominal and side muscles, especially if heavy weights are used.  Put ice on the sore area.  Put ice in a plastic bag.  Place a towel between your skin and the bag.  Leave the ice on for 15-20 minutes per hour while awake for the first 2 days.  Only take over-the-counter or prescription medicines for pain, discomfort, or fever as directed by your caregiver. SEEK IMMEDIATE MEDICAL CARE IF:   Your pain increases, or you are very uncomfortable.  You have a fever.  Your chest pain becomes worse.  You have new, unexplained symptoms.  You have nausea or vomiting.  You feel sweaty or lightheaded.  You have a cough with phlegm (sputum), or you cough up blood. MAKE SURE YOU:   Understand these instructions.  Will watch your condition.  Will get help right away if you are not doing well or get worse. Document Released: 09/02/2005 Document Revised: 11/25/2011 Document Reviewed: 04/29/2011 [facility] Patient Information 2015 [place], [place]. This information is not intended to replace advice given to you by your health care provider. Make sure you discuss any questions you have with your health care provider. Heat Therapy Heat therapy can help ease sore, stiff, injured, and tight muscles and joints. Heat relaxes your muscles, which may help ease your pain.  RISKS AND COMPLICATIONS If you have any of the following  conditions, do not use heat therapy unless your health care provider has approved:  Poor circulation.  Healing wounds or scarred skin in the area being treated.  Diabetes, heart disease, or high blood pressure.  Not being able to feel (numbness) the area being treated.  Unusual swelling of the area being treated.  Active infections.  Blood clots.  Cancer.  Inability to communicate pain. This may include young children and people who have problems with their brain function (dementia).  Pregnancy. Heat therapy should only be used on old, pre-existing, or long-lasting (chronic) injuries. Do not use heat therapy on new injuries unless directed by your health care provider. HOW TO USE HEAT THERAPY There are several different kinds of heat therapy, including:  Moist heat pack.  Warm water bath.  Hot water bottle.  Electric heating pad.  Heated gel pack.  Heated wrap.  Electric heating pad. Use the heat therapy method suggested by your health care provider. Follow your health care provider's instructions on when and how to use heat therapy. GENERAL HEAT THERAPY RECOMMENDATIONS  Do not sleep while using heat therapy. Only use heat therapy while you are awake.  Your skin may turn pink while using heat therapy. Do not use heat therapy if your skin turns red.  Do not use heat therapy if you have new pain.  High heat or long exposure to heat can cause burns. Be careful when using heat therapy to avoid burning your skin.  Do not use heat therapy on areas  of your skin that are already irritated, such as with a rash or sunburn. SEEK MEDICAL CARE IF:  You have blisters, redness, swelling, or numbness.  You have new pain.  Your pain is worse. MAKE SURE YOU:  Understand these instructions.  Will watch your condition.  Will get help right away if you are not doing well or get worse. Document Released: 11/25/2011 Document Revised: 01/17/2014 Document Reviewed:  10/26/2013 [facility] Patient Information 2015 [place], [place]. This information is not intended to replace advice given to you by your health care provider. Make sure you discuss any questions you have with your health care provider.

## 2015-04-19 NOTE — ED Provider Notes (Signed)
Fell 2 weeks ago - negative work up at that time Here for persistent rib pain Take percocet at home without relief.  CXR, ribs pending No SOB, hypoxia No abdominal tenderness   CXR shows no visualized fractures. ?atelectasis vs infectious process. The patient does not have any worse cough than her usual smoker's cough. No fever. Doubt infectious process, favor atelectasis.   She has pain medication at home and she is encouraged to follow up with PCP, continue medications. Will provide an incentive spirometer to aid in treatment.   Charlann Lange, PA-C 04/19/15 [place], DO 04/19/15 2104

## 2015-04-19 NOTE — ED Notes (Signed)
Pt ambulated well without any assistance.

## 2015-04-22 ENCOUNTER — Emergency Department (HOSPITAL_COMMUNITY): Payer: Medicare Other

## 2015-04-22 ENCOUNTER — Emergency Department (HOSPITAL_COMMUNITY)
Admission: EM | Admit: 2015-04-22 | Discharge: 2015-04-22 | Disposition: A | Discharge status: Home / Self Care | Payer: Medicare Other | Attending: Emergency Medicine | Admitting: Emergency Medicine

## 2015-04-22 ENCOUNTER — Encounter (HOSPITAL_COMMUNITY): Payer: Self-pay | Admitting: Emergency Medicine

## 2015-04-22 DIAGNOSIS — F329 Major depressive disorder, single episode, unspecified: Secondary | ICD-10-CM | POA: Insufficient documentation

## 2015-04-22 DIAGNOSIS — J159 Unspecified bacterial pneumonia: Secondary | ICD-10-CM | POA: Diagnosis not present

## 2015-04-22 DIAGNOSIS — Z79899 Other long term (current) drug therapy: Secondary | ICD-10-CM | POA: Diagnosis not present

## 2015-04-22 DIAGNOSIS — J441 Chronic obstructive pulmonary disease with (acute) exacerbation: Secondary | ICD-10-CM | POA: Insufficient documentation

## 2015-04-22 DIAGNOSIS — R0789 Other chest pain: Secondary | ICD-10-CM | POA: Diagnosis not present

## 2015-04-22 DIAGNOSIS — Z720 Tobacco use: Secondary | ICD-10-CM | POA: Diagnosis not present

## 2015-04-22 DIAGNOSIS — F419 Anxiety disorder, unspecified: Secondary | ICD-10-CM | POA: Insufficient documentation

## 2015-04-22 DIAGNOSIS — I252 Old myocardial infarction: Secondary | ICD-10-CM | POA: Insufficient documentation

## 2015-04-22 DIAGNOSIS — Z90710 Acquired absence of both cervix and uterus: Secondary | ICD-10-CM | POA: Diagnosis not present

## 2015-04-22 DIAGNOSIS — Z8781 Personal history of (healed) traumatic fracture: Secondary | ICD-10-CM | POA: Insufficient documentation

## 2015-04-22 DIAGNOSIS — Z91040 Latex allergy status: Secondary | ICD-10-CM | POA: Diagnosis not present

## 2015-04-22 DIAGNOSIS — R109 Unspecified abdominal pain: Secondary | ICD-10-CM | POA: Diagnosis present

## 2015-04-22 DIAGNOSIS — Z872 Personal history of diseases of the skin and subcutaneous tissue: Secondary | ICD-10-CM | POA: Insufficient documentation

## 2015-04-22 DIAGNOSIS — K219 Gastro-esophageal reflux disease without esophagitis: Secondary | ICD-10-CM | POA: Diagnosis not present

## 2015-04-22 DIAGNOSIS — R0781 Pleurodynia: Secondary | ICD-10-CM

## 2015-04-22 DIAGNOSIS — Z7982 Long term (current) use of aspirin: Secondary | ICD-10-CM | POA: Diagnosis not present

## 2015-04-22 DIAGNOSIS — Z9049 Acquired absence of other specified parts of digestive tract: Secondary | ICD-10-CM | POA: Diagnosis not present

## 2015-04-22 DIAGNOSIS — J189 Pneumonia, unspecified organism: Secondary | ICD-10-CM

## 2015-04-22 LAB — COMPREHENSIVE METABOLIC PANEL
ALT: 12 U/L — ABNORMAL LOW (ref 14–54)
ANION GAP: 7 (ref 5–15)
AST: 16 U/L (ref 15–41)
Albumin: 4.1 g/dL (ref 3.5–5.0)
Alkaline Phosphatase: 125 U/L (ref 38–126)
BILIRUBIN TOTAL: 0.5 mg/dL (ref 0.3–1.2)
BUN: 5 mg/dL — AB (ref 6–20)
CALCIUM: 9.1 mg/dL (ref 8.9–10.3)
CO2: 24 mmol/L (ref 22–32)
CREATININE: 0.86 mg/dL (ref 0.44–1.00)
Chloride: 105 mmol/L (ref 101–111)
GFR calc Af Amer: >60 mL/min (ref >60)
Glucose, Bld: 88 mg/dL (ref 65–99)
Potassium: 3.9 mmol/L (ref 3.5–5.1)
SODIUM: 136 mmol/L (ref 135–145)
Total Protein: 7.3 g/dL (ref 6.5–8.1)

## 2015-04-22 LAB — URINALYSIS, ROUTINE W REFLEX MICROSCOPIC
Bilirubin Urine: NEGATIVE
GLUCOSE, UA: NEGATIVE mg/dL
Ketones, ur: NEGATIVE mg/dL
Nitrite: NEGATIVE
Protein, ur: NEGATIVE mg/dL
SPECIFIC GRAVITY, URINE: 1.008 (ref 1.005–1.030)
Urobilinogen, UA: 0.2 mg/dL (ref 0.0–1.0)
pH: 6 (ref 5.0–8.0)

## 2015-04-22 LAB — CBC WITH DIFFERENTIAL/PLATELET
BASOS ABS: 0 10*3/uL (ref 0.0–0.1)
BASOS PCT: 0 % (ref 0–1)
EOS PCT: 2 % (ref 0–5)
Eosinophils Absolute: 0.2 10*3/uL (ref 0.0–0.7)
HCT: 36.7 % (ref 36.0–46.0)
Hemoglobin: 12.1 g/dL (ref 12.0–15.0)
LYMPHS ABS: 3.7 10*3/uL (ref 0.7–4.0)
LYMPHS PCT: 35 % (ref 12–46)
MCH: 27.7 pg (ref 26.0–34.0)
MCHC: 33 g/dL (ref 30.0–36.0)
MCV: 84 fL (ref 78.0–100.0)
MONOS PCT: 6 % (ref 3–12)
Monocytes Absolute: 0.6 10*3/uL (ref 0.1–1.0)
NEUTROS PCT: 57 % (ref 43–77)
Neutro Abs: 6 10*3/uL (ref 1.7–7.7)
PLATELETS: 390 10*3/uL (ref 150–400)
RBC: 4.37 MIL/uL (ref 3.87–5.11)
RDW: 15.4 % (ref 11.5–15.5)
WBC: 10.5 10*3/uL (ref 4.0–10.5)

## 2015-04-22 LAB — URINE MICROSCOPIC-ADD ON

## 2015-04-22 IMAGING — CT CT HEAD W/O CM
2 of 4 series · 16 of 30 positions shown, 18 images · non-contrast
Comparison: 01/20/2013

CLINICAL DATA: Occipital headache, dizziness, memory loss,
generalized weakness, fall 6 weeks ago

CT HEAD WITHOUT CONTRAST
TECHNIQUE: Contiguous axial images were obtained from the base of
the skull through the vertex without contrast.

[Series 2: head w/o · axial · non-contrast · 0.43mm/px · z∈[-110,-5]mm · 8 of 28 slices shown, 10 images]
[im 4/28  brain]
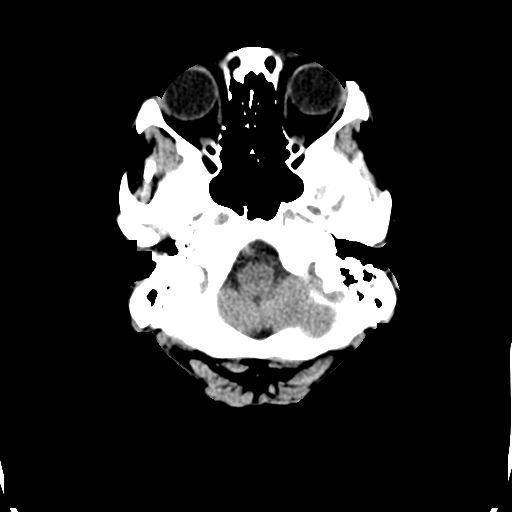
[im 4/28  bone]
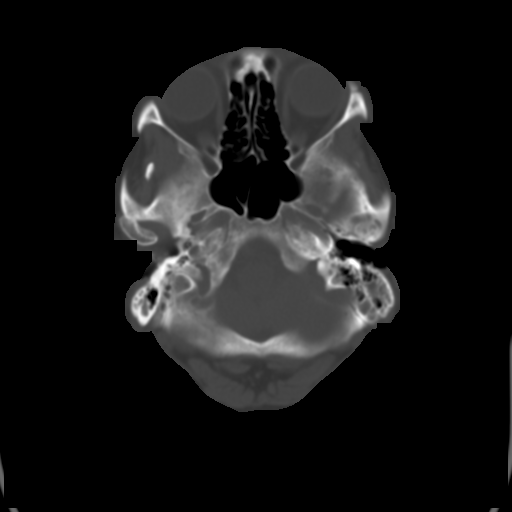
[im 7/28  brain]
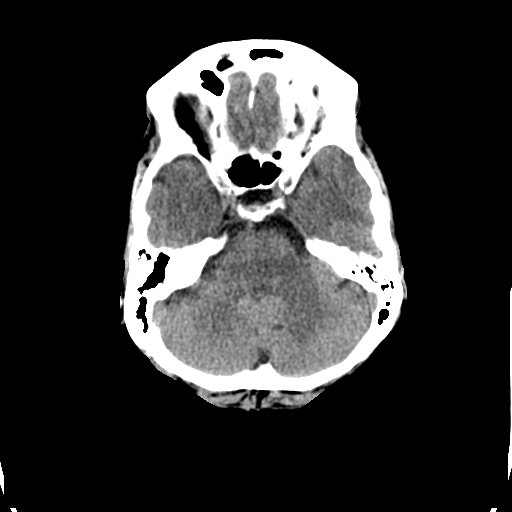
[im 10/28  brain]
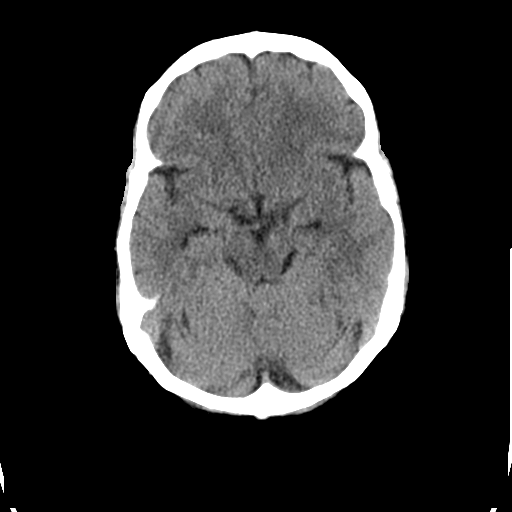
[im 13/28  brain]
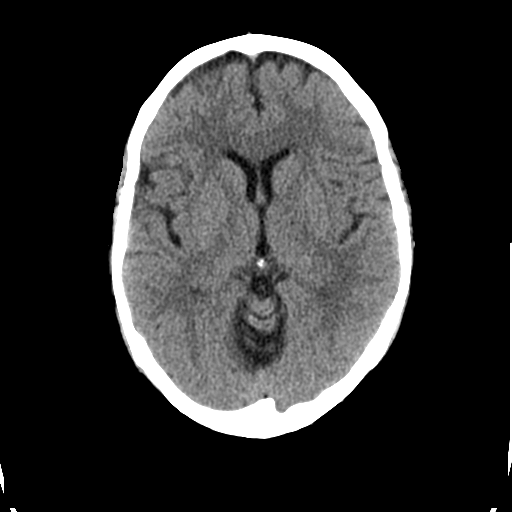
[im 16/28  brain]
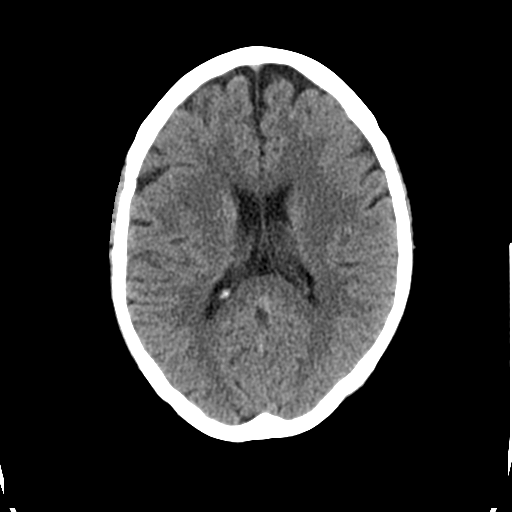
[im 16/28  bone]
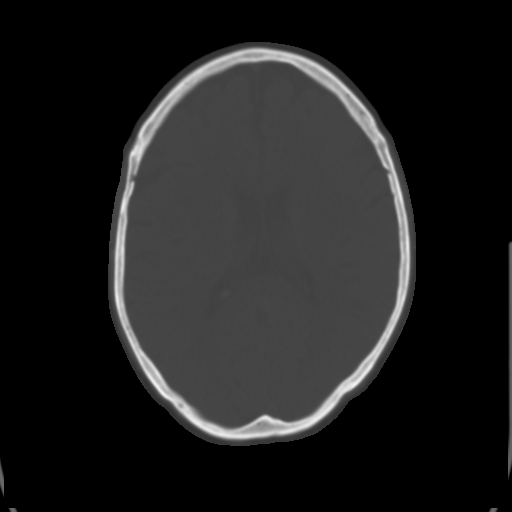
[im 19/28  brain]
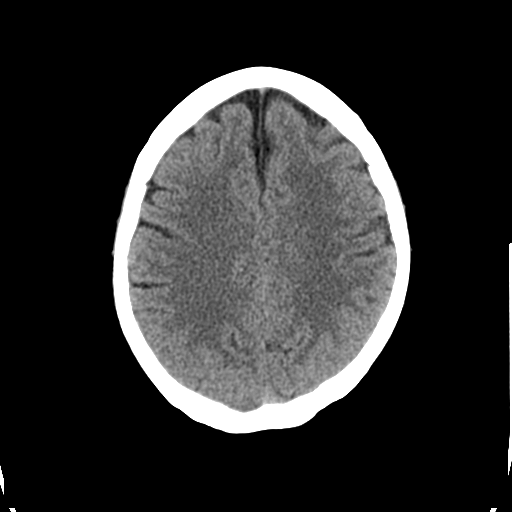
[im 22/28  brain]
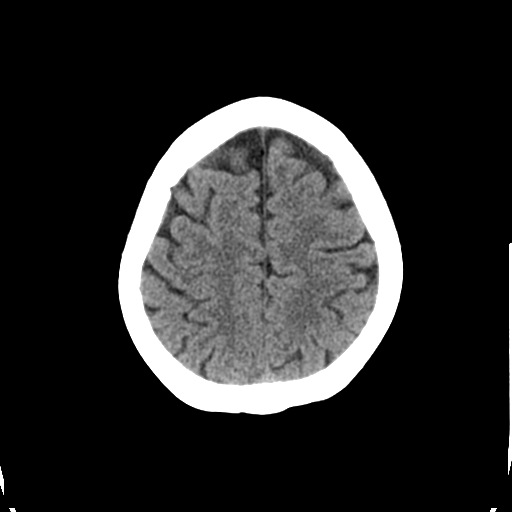
[im 25/28  brain]
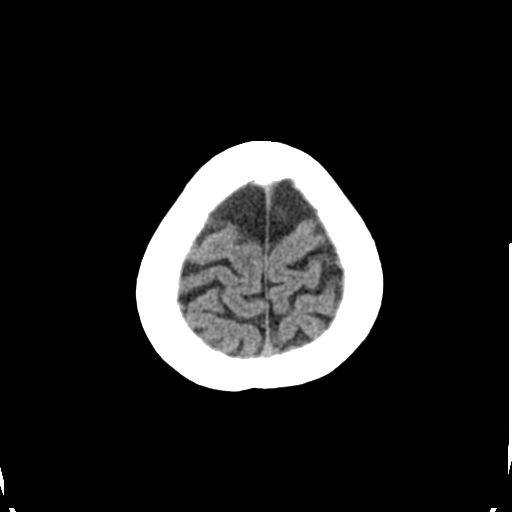

[Series 3: bone windows · axial · 0.43mm/px · z∈[-119,+1]mm · 8 of 46 slices shown]
[im 3/46  bone]
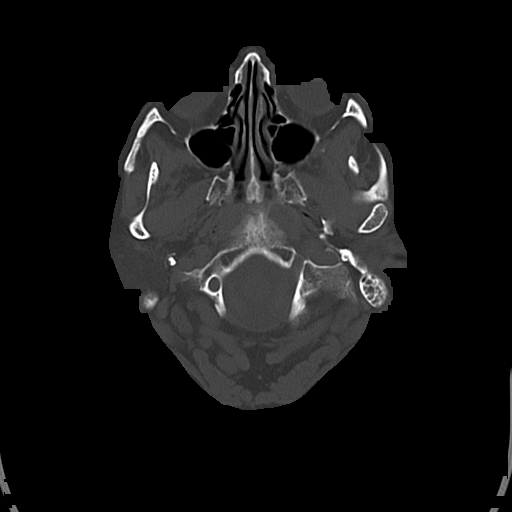
[im 9/46  bone]
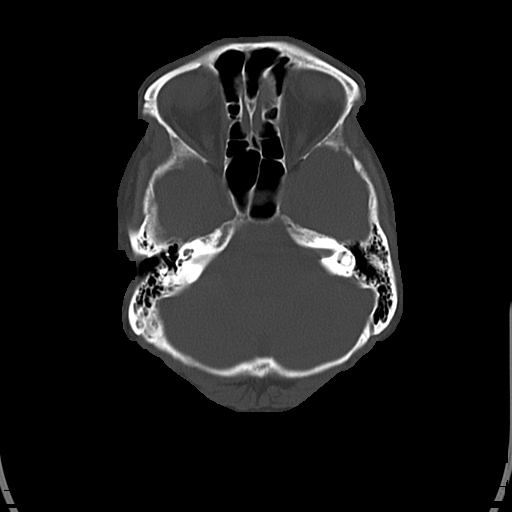
[im 15/46  bone]
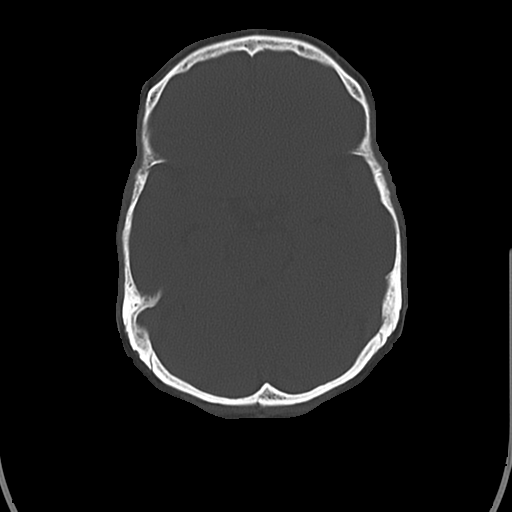
[im 20/46  bone]
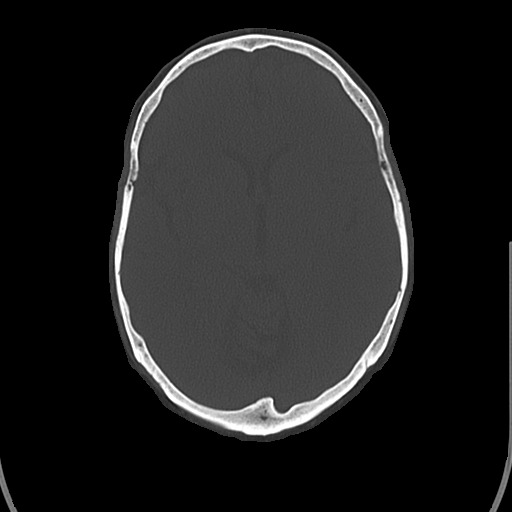
[im 26/46  bone]
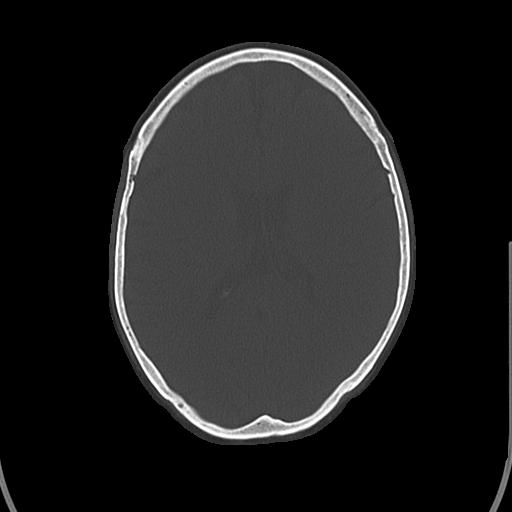
[im 31/46  bone]
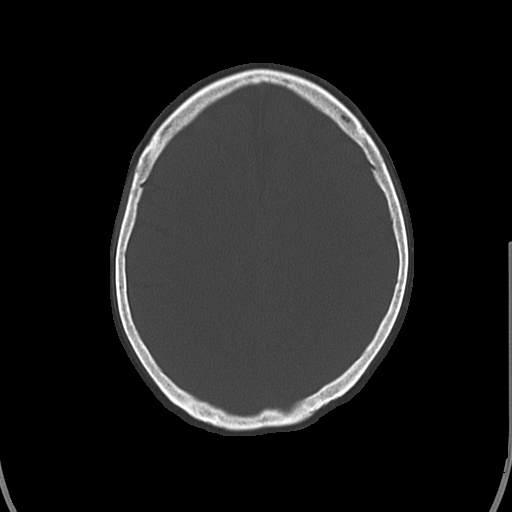
[im 37/46  bone]
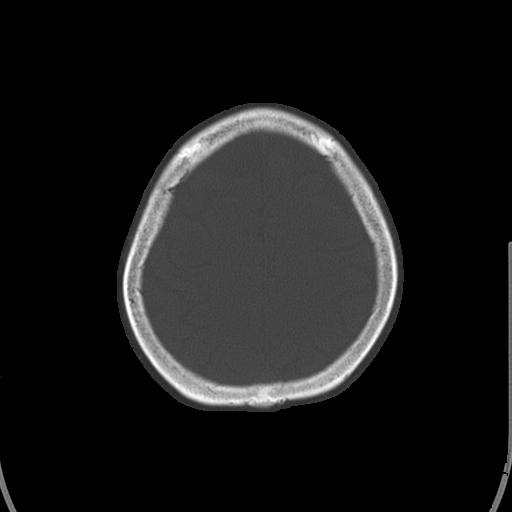
[im 43/46  bone]
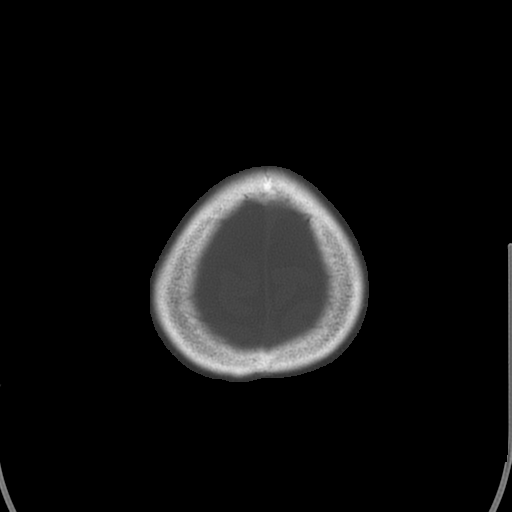

[16 of 30 positions shown; findings below may reference images not displayed]

FINDINGS: No evidence of parenchymal hemorrhage or extra-axial
fluid collection. No mass lesion, mass effect, or midline shift.

No CT evidence of acute infarction.

Cerebral volume is age appropriate.  No ventriculomegaly.

Visualized paranasal sinuses are essentially clear.  Partial
opacification of the left mastoid air cells.

No evidence of calvarial fracture.
IMPRESSION: No evidence of acute intracranial abnormality.

Partial opacification of the left mastoid air cells, unchanged.

## 2015-04-22 MED ORDER — MELOXICAM 15 MG PO TABS: 15.0000 mg | ORAL_TABLET | Freq: Every day | ORAL | Status: DC

## 2015-04-22 MED ORDER — CYCLOBENZAPRINE HCL 10 MG PO TABS: 10.0000 mg | ORAL_TABLET | Freq: Two times a day (BID) | ORAL | Status: DC | PRN

## 2015-04-22 MED ORDER — IOHEXOL 300 MG/ML  SOLN
100.0000 mL | Freq: Once | INTRAMUSCULAR | Status: AC | PRN
  Administered 2015-04-22: 100 mL via INTRAVENOUS

## 2015-04-22 MED ORDER — KETOROLAC TROMETHAMINE 30 MG/ML IJ SOLN
30.0000 mg | Freq: Once | INTRAMUSCULAR | Status: AC
  Administered 2015-04-22: 30 mg via INTRAVENOUS
  Filled 2015-04-22: qty 1

## 2015-04-22 MED ORDER — AZITHROMYCIN 250 MG PO TABS: 250.0000 mg | ORAL_TABLET | Freq: Every day | ORAL | Status: DC

## 2015-04-22 MED ORDER — OXYCODONE-ACETAMINOPHEN 5-325 MG PO TABS
1.0000 | ORAL_TABLET | Freq: Once | ORAL | Status: AC
  Administered 2015-04-22: 1 via ORAL
  Filled 2015-04-22: qty 1

## 2015-04-22 NOTE — ED Provider Notes (Signed)
CSN: [id number]     Arrival date & time 04/22/15  0716 History   First MD Initiated Contact with Patient 04/22/15 0719     Chief Complaint  Patient presents with  . Flank Pain  . Back Pain     (Consider location/radiation/quality/duration/timing/severity/associated sxs/prior Treatment) HPI Lori [name] is a 59 y.o. female with hx of COPD, GERD, Depression, presents to ED with complaint of abdominal and right flank pain. Pt states her symptoms began after a fall 2 wks ago. States pain worsened with movement, deep breathing, coughing. She has been in ED twice for the same. Has had negative CT head, lumbar films, right rib/CXR. Pt states she has been taking percocet which helps some but states feels like pain is worsening. Pt denies fever, chills. Denies nausea, vomiting, malaise. No other complaints.   Past Medical History  Diagnosis Date  . COPD (chronic obstructive pulmonary disease) with chronic bronchitis     & Emphysema  . Asthma   . Barrett esophagus 2007  . Hiatal hernia 1245,8099  . Duodenitis without mention of hemorrhage 2007  . Esophageal stricture   . Esophageal reflux 2007  . Anxiety   . Depression   . Multiple fractures     from falls, fx rt. elbow, fx left wrist, bilateral ankles  . Allergy   . Ulcer   . H/O hiatal hernia   . Chest pain 05-02-2009    echo  EF 55%  . H/O cardiovascular stress test 05-02-2009    low risk scan, neg. for ischemia, no ekg changes  . Full dentures   . Acute MI     x3   Past Surgical History  Procedure Laterality Date  . Abdominal hysterectomy    . Neck surgery    . Cholecystectomy    . Elbow fracture surgery      right  . Wrist fracture surgery      left, has plate  . Colonoscopy    . Nm myoview ltd  07/02/2013    Normal LV function, EF 83%. (Likely overestimated due to low volume);; no evidence of ischemia or infarction. Normal wall motion.  . Lower extremity arterial dopplers  07/06/2013    RABI 1.0, LABI 1.1.; No evidence  of significant vascular atherogenic plaque  . Tonsillectomy    . Knee arthroscopy with excision plica Left 8/33/8250    Procedure: KNEE ARTHROSCOPY WITH EXCISION PLICA;  Surgeon: Marybelle Killings, MD;  Location: [place];  Service: Orthopedics;  Laterality: Left;  . Chondroplasty Left 01/06/2015    Procedure: CHONDROPLASTY;  Surgeon: Marybelle Killings, MD;  Location: [place];  Service: Orthopedics;  Laterality: Left;   Family History  Problem Relation Age of Onset  . Emphysema Father   . Colon cancer Neg Hx   . Diabetes Maternal Grandmother   . Diabetes Maternal Aunt   . Diabetes      mat. cousin  . Heart disease Brother   . Dementia Mother    History  Substance Use Topics  . Smoking status: Current Every Day Smoker -- 0.25 packs/day for 21 years    Types: Cigarettes  . Smokeless tobacco: Never Used     Comment: tobaccco info given 03/06/12  . Alcohol Use: No   OB History    No data available     Review of Systems  Constitutional: Negative for fever and chills.  Respiratory: Positive for cough, chest tightness and shortness of breath. Negative for choking.  Cardiovascular: Positive for chest pain. Negative for palpitations and leg swelling.  Gastrointestinal: Positive for abdominal pain. Negative for nausea, vomiting and diarrhea.  Genitourinary: Positive for flank pain. Negative for dysuria and pelvic pain.  Musculoskeletal: Positive for arthralgias. Negative for neck pain and neck stiffness.  Skin: Negative for rash.  Neurological: Negative for dizziness, weakness and headaches.  All other systems reviewed and are negative.     Allergies  Bee venom; Codeine; Ibuprofen; Propoxyphene n-acetaminophen; Toradol; Hydrocodone-acetaminophen; and Latex  Home Medications   Prior to Admission medications   Medication Sig Start Date End Date Taking? Authorizing Provider  ALPRAZolam Duanne Moron) 1 MG tablet Take 1 tablet by mouth as needed. 08/17/14    Historical Provider, MD  aspirin 81 MG tablet Take 81 mg by mouth daily.    Historical Provider, MD  butalbital-acetaminophen-caffeine (FIORICET, ESGIC) 50-325-40 MG per tablet Take 1 tablet by mouth 2 (two) times daily as needed for headache.    Historical Provider, MD  dexlansoprazole (DEXILANT) 60 MG capsule Take 60 mg by mouth daily.    Historical Provider, MD  dicyclomine (BENTYL) 20 MG tablet Take 1 tablet by mouth daily. 08/15/14   Historical Provider, MD  diphenoxylate-atropine (LOMOTIL) 2.5-0.025 MG per tablet Take 1 tablet by mouth every 4 (four) hours as needed for diarrhea or loose stools.  09/05/14   Historical Provider, MD  docusate sodium (COLACE) 100 MG capsule Take 100 mg by mouth 2 (two) times daily.    Historical Provider, MD  HYDROcodone-acetaminophen (NORCO) 7.5-325 MG per tablet Take 2 tablets by mouth every 4 (four) hours as needed for moderate pain or severe pain. Patient not taking: Reported on 04/18/2015 01/06/15   Marybelle Killings, MD  hydrOXYzine (ATARAX/VISTARIL) 25 MG tablet Take 1 tablet by mouth as needed for anxiety.  08/17/14   Historical Provider, MD  ibuprofen (ADVIL,MOTRIN) 800 MG tablet Take 800 mg by mouth 3 (three) times daily as needed for mild pain or moderate pain.    Historical Provider, MD  ketoconazole (NIZORAL) 200 MG tablet Take 1 tablet (200 mg total) by mouth daily. Patient not taking: Reported on 04/18/2015 01/23/14   Harden Mo, MD  loperamide (IMODIUM) 2 MG capsule Take 1 capsule by mouth daily. 10/07/14   Historical Provider, MD  LORazepam (ATIVAN) 1 MG tablet Take 1 tablet (1 mg total) by mouth 3 (three) times daily as needed (dizziness / nausea). Patient not taking: Reported on 04/19/2015 07/20/14   Carmin Muskrat, MD  meclizine (ANTIVERT) 25 MG tablet Take 1 tablet by mouth daily. 09/30/14   Historical Provider, MD  mometasone-formoterol (DULERA) 200-5 MCG/ACT AERO Inhale 2 puffs into the lungs 2 (two) times daily.    Historical Provider, MD   montelukast (SINGULAIR) 10 MG tablet Take 10 mg by mouth 2 (two) times daily.     Historical Provider, MD  nitroGLYCERIN (NITROSTAT) 0.4 MG SL tablet Place 0.4 mg under the tongue every 5 (five) minutes as needed for chest pain.    Historical Provider, MD  ondansetron (ZOFRAN) 4 MG tablet Take 4 mg by mouth every 6 (six) hours as needed. For nausea 10/18/13   Historical Provider, MD  oxybutynin (DITROPAN) 5 MG tablet Take 5 mg by mouth 2 (two) times daily.    Historical Provider, MD  oxyCODONE-acetaminophen (PERCOCET/ROXICET) 5-325 MG per tablet Take 1-2 tablets by mouth every 6 (six) hours as needed for severe pain. Patient not taking: Reported on 04/19/2015 04/07/15   Alfonzo Beers, MD  PROAIR HFA 108 [phone]  BASE) MCG/ACT inhaler Inhale 1 mcg into the lungs 2 (two) times daily. 08/19/14   Historical Provider, MD  promethazine (PHENERGAN) 25 MG tablet Take 1 tablet by mouth as needed for nausea.  06/20/14   Historical Provider, MD  promethazine-dextromethorphan (PROMETHAZINE-DM) 6.25-15 MG/5ML syrup Take 5 mLs by mouth 4 (four) times daily as needed for cough.    Historical Provider, MD  ranitidine (ZANTAC) 150 MG tablet Take 150 mg by mouth 2 (two) times daily.    Historical Provider, MD  valACYclovir (VALTREX) 500 MG tablet Take 500 mg by mouth daily.    Historical Provider, MD   BP 127/59 mmHg  Pulse 92  Temp(Src) 98.5 F (36.9 C) (Oral)  Resp 18  SpO2 100% Physical Exam  Constitutional: She appears well-developed and well-nourished. No distress.  HENT:  Head: Normocephalic and atraumatic.  Eyes: Conjunctivae are normal.  Neck: Neck supple.  Cardiovascular: Normal rate, regular rhythm and normal heart sounds.   Pulmonary/Chest: Effort normal and breath sounds normal. No respiratory distress. She has no wheezes. She has no rales. She exhibits tenderness.  Tenderness over right lower ribs. No bruising, rashes, erythema  Abdominal: Soft. Bowel sounds are normal. She exhibits no distension. There  is tenderness. There is no rebound and no guarding.  Right upper quadrant tenderness. Right CVA tenderness  Neurological: She is alert.  Skin: Skin is warm and dry.  Nursing note and vitals reviewed.   ED Course  Procedures (including critical care time) Labs Review Labs Reviewed  COMPREHENSIVE METABOLIC PANEL - Abnormal; Notable for the following:    BUN 5 (*)    ALT 12 (*)    All other components within normal limits  URINALYSIS, ROUTINE W REFLEX MICROSCOPIC (NOT AT [facility]) - Abnormal; Notable for the following:    APPearance CLOUDY (*)    Hgb urine dipstick TRACE (*)    Leukocytes, UA TRACE (*)    All other components within normal limits  URINE MICROSCOPIC-ADD ON - Abnormal; Notable for the following:    Squamous Epithelial / LPF MANY (*)    All other components within normal limits  CBC WITH DIFFERENTIAL/PLATELET    Imaging Review Ct Abdomen Pelvis W Contrast  04/22/2015   CLINICAL DATA:  Fall 2 weeks ago. Right flank pain. Vomiting. Pleuritic pain and cough. Initial encounter.  EXAM: CT ABDOMEN AND PELVIS WITH CONTRAST  TECHNIQUE: Multidetector CT imaging of the abdomen and pelvis was performed using the standard protocol following bolus administration of intravenous contrast.  CONTRAST:  115mL OMNIPAQUE IOHEXOL 300 MG/ML  SOLN  COMPARISON:  CT of the abdomen and pelvis on 10/07/2012  FINDINGS: Visualized lung bases show atelectasis versus infiltrate at the posterior right lung base and mild atelectasis at the left lung base. No visualized basilar pneumothorax or pleural fluid. No lower rib fractures identified.  The liver is unremarkable. Stable dilatation of the common bile duct and central hepatic ducts post cholecystectomy. The pancreas, spleen, adrenal glands and kidneys are unremarkable. No abnormal fluid collections.  Bowel loops are within normal limits. No evidence of obstruction, free air or inflammation. The bladder is unremarkable. The uterus has been removed. Bony  structures show no evidence of fracture. No bony lesions are seen.  IMPRESSION: 1. Atelectasis versus infiltrate at the posterior right lung base. Mild atelectasis at the left lung base. 2. Stable post cholecystectomy dilatation of the extrahepatic bile ducts. 3. No acute findings in the abdomen or pelvis.   Electronically Signed   By: Jenness Corner.D.  On: 04/22/2015 10:38     EKG Interpretation None      MDM   Final diagnoses:  None   patient with persistent right flank pain after a fall now several weeks ago. This is her third trip to emergency department for the same. Will get a CT to rule out any intra-abdominal trauma. We'll get labs and urinalysis.  10:51 AM CT negative except for atelectasis versus possible pneumonia right lower lung. Given patient's history and symptoms, this is most likely atelectasis however will cover with Zithromax for possible infection. Patient states she has an incentive spirometer at home that she uses twice a day. Instructed to use it every few hours. Will discharge home with a muscle relaxant, NSAID, close follow-up with primary care doctor.  Filed Vitals:   04/22/15 0725 04/22/15 0935 04/22/15 1128  BP: 127/59 115/72 105/30  Pulse: 92 75 67  Temp: 98.5 F (36.9 C)  98.9 F (37.2 C)  TempSrc: Oral  Oral  Resp: 18 18 18   SpO2: 100% 96% 96%     Jeannett Senior, PA-C 04/22/15 [place], MD 04/23/15 [phone]

## 2015-04-22 NOTE — ED Notes (Addendum)
1x unsuccessful lab draw R hand, RN Maylon Cos starting IV now, will get labs

## 2015-04-22 NOTE — Discharge Instructions (Signed)
Take mobic for pain as prescribed. Take flexeril for spasms. Take zithromax for infection until all gone. Continue to use your spirometer every 2 hrs. Follow up closely with your doctor.    Chest Wall Pain Chest wall pain is pain in or around the bones and muscles of your chest. It may take up to 6 weeks to get better. It may take longer if you must stay physically active in your work and activities.  CAUSES  Chest wall pain may happen on its own. However, it may be caused by:  A viral illness like the flu.  Injury.  Coughing.  Exercise.  Arthritis.  Fibromyalgia.  Shingles. HOME CARE INSTRUCTIONS   Avoid overtiring physical activity. Try not to strain or perform activities that cause pain. This includes any activities using your chest or your abdominal and side muscles, especially if heavy weights are used.  Put ice on the sore area.  Put ice in a plastic bag.  Place a towel between your skin and the bag.  Leave the ice on for 15-20 minutes per hour while awake for the first 2 days.  Only take over-the-counter or prescription medicines for pain, discomfort, or fever as directed by your caregiver. SEEK IMMEDIATE MEDICAL CARE IF:   Your pain increases, or you are very uncomfortable.  You have a fever.  Your chest pain becomes worse.  You have new, unexplained symptoms.  You have nausea or vomiting.  You feel sweaty or lightheaded.  You have a cough with phlegm (sputum), or you cough up blood. MAKE SURE YOU:   Understand these instructions.  Will watch your condition.  Will get help right away if you are not doing well or get worse. Document Released: 09/02/2005 Document Revised: 11/25/2011 Document Reviewed: 04/29/2011 [facility] Patient Information 2015 [place], [place]. This information is not intended to replace advice given to you by your health care provider. Make sure you discuss any questions you have with your health care provider.

## 2015-04-22 NOTE — ED Notes (Signed)
Pt states that she fell 2 wks ago and hit the back of her head.  States that she has been having rt sided flank pain since then that "stings and burns".  States that she was given percocet but "it didn't do no good and it's hurting worser".  Pt states that she has been vomiting and denies diarrhea and dysuria.  States that it hurts worse when she breathes and coughs.

## 2015-05-08 ENCOUNTER — Ambulatory Visit (HOSPITAL_COMMUNITY)
Admission: RE | Admit: 2015-05-08 | Discharge: 2015-05-08 | Disposition: A | Discharge status: Home / Self Care | Payer: Medicare Other | Source: Ambulatory Visit | Attending: Specialist | Admitting: Specialist

## 2015-05-08 DIAGNOSIS — Z1231 Encounter for screening mammogram for malignant neoplasm of breast: Secondary | ICD-10-CM

## 2015-05-15 ENCOUNTER — Encounter: Payer: Self-pay | Admitting: Diagnostic Neuroimaging

## 2015-05-15 ENCOUNTER — Ambulatory Visit (INDEPENDENT_AMBULATORY_CARE_PROVIDER_SITE_OTHER): Payer: Medicare Other | Admitting: Diagnostic Neuroimaging

## 2015-05-15 VITALS — BP 127/68 | HR 93 | Ht 64.0 in | Wt 130.6 lb

## 2015-05-15 DIAGNOSIS — M79605 Pain in left leg: Secondary | ICD-10-CM

## 2015-05-15 DIAGNOSIS — R269 Unspecified abnormalities of gait and mobility: Secondary | ICD-10-CM

## 2015-05-15 DIAGNOSIS — M79604 Pain in right leg: Secondary | ICD-10-CM | POA: Diagnosis not present

## 2015-05-15 NOTE — Patient Instructions (Signed)
I recommend physical therapy.  I recommend pain management clinic.  Find ways to manage stress.

## 2015-05-15 NOTE — Progress Notes (Signed)
[facility]  PATIENT: Lori [name] DOB: 07-17-1956  REFERRING CLINICIAN:  HISTORY FROM: patient, friend REASON FOR VISIT: follow up   HISTORICAL  CHIEF COMPLAINT:  Chief Complaint  Patient presents with  . Fall    rm 7, New Patient, friend - [name]  . weakness of legs    HISTORY OF PRESENT ILLNESS:   UPDATE 05/15/15: Since last visit, continues with significant pain in legs. Has not seen pain mgmt or psychiatry. Still with weakness, cramps, pain in legs.   UPDATE 09/21/13: Since last visit, continues to have pain (now right foot, but also chest pain, arm pain, back pain). Had cardiology and ER evaluation of chest pain, with unremarkable test results.  My evaluation from last visit also unremarkable (MRI lumbar, labs). Patient still under significant psychosocial stress (family, siblings, mother) and patient begins to cry in the office. Husband and friend agree that stress is a sig factor. She declines to see psychiatry. She takes xanax at bedtime.  PRIOR HPI (06/14/13): 59 year old right-handed female with hypercholesterolemia, heart disease, migraine, depression, anxiety, smoking, here for evaluation of lower extremity pain. July 2014 patient was at home, with nausea and vomiting and passed out. She fell down to the ground and patient's husband found her at home in a semi-conscious state. Patient was taken to the emergency room for further evaluation. Apparently she was diagnosed with low blood pressure and anemia. Ever since that time she's had low back pain, bilateral leg pain and weakness. She saw orthopedic surgery without a specific diagnosis. She also has diffuse upper and lower extremity weakness and pain.   REVIEW OF SYSTEMS: Full 14 system review of systems performed and notable only for dizziness headache weakness passing out nervousness anxiety pain joint swelling back pain aching muscles muscle cramps walking difficulty blood and urine environmental  allergies vessels legs diarrhea nausea vomiting cough wheezing shortness of breath.  ALLERGIES: Allergies  Allergen Reactions  . Bee Venom Swelling  . Codeine Nausea Only    CAUSES ULCERS  . Ibuprofen Nausea And Vomiting  . Propoxyphene N-Acetaminophen Nausea Only  . Toradol [Ketorolac Tromethamine] Nausea And Vomiting  . Hydrocodone-Acetaminophen Nausea Only    REACTION: "sick"  . Latex Rash    HOME MEDICATIONS: Outpatient Encounter Prescriptions as of 05/15/2015  Medication Sig  . ALPRAZolam (XANAX) 1 MG tablet Take 1 tablet by mouth as needed for anxiety.   Marland Kitchen aspirin 81 MG tablet Take 81 mg by mouth daily.  . butalbital-acetaminophen-caffeine (FIORICET, ESGIC) 50-325-40 MG per tablet Take 1 tablet by mouth 2 (two) times daily as needed for headache.  . cyclobenzaprine (FLEXERIL) 10 MG tablet Take 1 tablet (10 mg total) by mouth 2 (two) times daily as needed for muscle spasms.  Marland Kitchen dexlansoprazole (DEXILANT) 60 MG capsule Take 60 mg by mouth daily.  . diclofenac (VOLTAREN) 75 MG EC tablet TK 1 T PO BID WF PRN P  . diphenoxylate-atropine (LOMOTIL) 2.5-0.025 MG per tablet Take 1 tablet by mouth every 4 (four) hours as needed for diarrhea or loose stools.   Marland Kitchen ketoconazole (NIZORAL) 200 MG tablet Take 1 tablet (200 mg total) by mouth daily.  Marland Kitchen loperamide (IMODIUM) 2 MG capsule Take 1 capsule by mouth daily.  . meclizine (ANTIVERT) 25 MG tablet Take 1 tablet by mouth daily.  . mometasone-formoterol (DULERA) 200-5 MCG/ACT AERO Inhale 2 puffs into the lungs 2 (two) times daily.  . montelukast (SINGULAIR) 10 MG tablet Take 10 mg by mouth at bedtime.   Marland Kitchen  nitroGLYCERIN (NITROSTAT) 0.4 MG SL tablet Place 0.4 mg under the tongue every 5 (five) minutes as needed for chest pain.  Marland Kitchen omeprazole (PRILOSEC) 20 MG capsule TK 1 C PO QD  . PROAIR HFA 108 (90 BASE) MCG/ACT inhaler Inhale 1 mcg into the lungs 2 (two) times daily.  . simvastatin (ZOCOR) 20 MG tablet TK 1 T PO  QPM BEFORE BEDTIME  .  valACYclovir (VALTREX) 500 MG tablet TK 1 T PO  ONCE D  . docusate sodium (COLACE) 100 MG capsule Take 100 mg by mouth 2 (two) times daily.  Marland Kitchen HYDROcodone-acetaminophen (NORCO) 7.5-325 MG per tablet Take 2 tablets by mouth every 4 (four) hours as needed for moderate pain or severe pain. (Patient not taking: Reported on 04/18/2015)  . LORazepam (ATIVAN) 1 MG tablet Take 1 tablet (1 mg total) by mouth 3 (three) times daily as needed (dizziness / nausea). (Patient not taking: Reported on 04/19/2015)  . meloxicam (MOBIC) 15 MG tablet Take 1 tablet (15 mg total) by mouth daily. (Patient not taking: Reported on 05/15/2015)  . ondansetron (ZOFRAN) 4 MG tablet Take 4 mg by mouth every 6 (six) hours as needed. For nausea  . oxybutynin (DITROPAN) 5 MG tablet Take 5 mg by mouth 2 (two) times daily.  Marland Kitchen oxyCODONE-acetaminophen (PERCOCET/ROXICET) 5-325 MG per tablet Take 1-2 tablets by mouth every 6 (six) hours as needed for severe pain. (Patient not taking: Reported on 04/19/2015)  . promethazine (PHENERGAN) 25 MG tablet Take 1 tablet by mouth as needed for nausea.   . theophylline (THEODUR) 200 MG 12 hr tablet TK 2 TS PO D  . [DISCONTINUED] azithromycin (ZITHROMAX) 250 MG tablet Take 1 tablet (250 mg total) by mouth daily. Take first 2 tablets together, then 1 every day until finished.   No facility-administered encounter medications on file as of 05/15/2015.    PAST MEDICAL HISTORY: Past Medical History  Diagnosis Date  . COPD (chronic obstructive pulmonary disease) with chronic bronchitis     & Emphysema  . Asthma   . Barrett esophagus 2007  . Hiatal hernia [id number]  . Duodenitis without mention of hemorrhage 2007  . Esophageal stricture   . Esophageal reflux 2007  . Anxiety   . Depression   . Multiple fractures     from falls, fx rt. elbow, fx left wrist, bilateral ankles  . Allergy   . Ulcer   . H/O hiatal hernia   . Chest pain 05-02-2009    echo  EF 55%  . H/O cardiovascular stress test 05-02-2009     low risk scan, neg. for ischemia, no ekg changes  . Full dentures   . Acute MI     x3    PAST SURGICAL HISTORY: Past Surgical History  Procedure Laterality Date  . Abdominal hysterectomy    . Neck surgery    . Cholecystectomy    . Elbow fracture surgery      right  . Wrist fracture surgery      left, has plate  . Colonoscopy    . Nm myoview ltd  07/02/2013    Normal LV function, EF 83%. (Likely overestimated due to low volume);; no evidence of ischemia or infarction. Normal wall motion.  . Lower extremity arterial dopplers  07/06/2013    RABI 1.0, LABI 1.1.; No evidence of significant vascular atherogenic plaque  . Tonsillectomy    . Knee arthroscopy with excision plica Left 9/39/0300    Procedure: KNEE ARTHROSCOPY WITH EXCISION PLICA;  Surgeon: Thana Farr  Lorin Mercy, MD;  Location: [place];  Service: Orthopedics;  Laterality: Left;  . Chondroplasty Left 01/06/2015    Procedure: CHONDROPLASTY;  Surgeon: Marybelle Killings, MD;  Location: [facility];  Service: Orthopedics;  Laterality: Left;    FAMILY HISTORY: Family History  Problem Relation Age of Onset  . Emphysema Father   . Colon cancer Neg Hx   . Diabetes Maternal Grandmother   . Diabetes Maternal Aunt   . Diabetes      mat. cousin  . Heart disease Brother   . Dementia Mother     SOCIAL HISTORY:  Social History   Social History  . Marital Status: Married    Spouse Name: [name]  . Number of Children: 2  . Years of Education: HS   Occupational History  . disable    Social History Main Topics  . Smoking status: Current Every Day Smoker -- 1.50 packs/day for 21 years    Types: Cigarettes  . Smokeless tobacco: Never Used     Comment: tobaccco info given 03/06/12, 1.5 PPD 05/15/15  . Alcohol Use: No  . Drug Use: No  . Sexual Activity: Not on file   Other Topics Concern  . Not on file   Social History Narrative   Patient lives at home spouse - [name].  Has 2 daughters (59 & 70).    Currently "disabled".   Caffeine Use: tea 4 glasses daily.    Smokes ~1 1/2 PPD  05/15/15   Walks everyday - no set routine.     PHYSICAL EXAM  Filed Vitals:   05/15/15 0837  BP: 127/68  Pulse: 93  Height: 5\' 4"  (1.626 m)  Weight: 130 lb 9.6 oz (59.24 kg)    Not recorded      Body mass index is 22.41 kg/(m^2).  GENERAL EXAM: Patient is in no distress.  CARDIOVASCULAR: Regular rate and rhythm, no murmurs, no carotid bruits  NEUROLOGIC: MENTAL STATUS: awake, alert, language fluent, comprehension intact, naming intact CRANIAL NERVE: pupils equal and reactive to light, visual fields full to confrontation, extraocular muscles intact, no nystagmus, facial sensation and strength symmetric, uvula midline, shoulder shrug symmetric, tongue midline. MOTOR: BUE 4 (LIMITED BY PAIN); BLE 3-4 (LIMITED BY PAIN) SENSORY: normal and symmetric to light touch; HYPERSENSITIVE IN FEET. COORDINATION: finger-nose-finger, fine finger movements normal REFLEXES: deep tendon reflexes present and symmetric GAIT/STATION: ANTALGIC, SLOW GAIT. UNSTEADY.    DIAGNOSTIC DATA (LABS, IMAGING, TESTING) - I reviewed patient records, labs, notes, testing and imaging myself where available.  Lab Results  Component Value Date   WBC 10.5 04/22/2015   HGB 12.1 04/22/2015   HCT 36.7 04/22/2015   MCV 84.0 04/22/2015   PLT 390 04/22/2015      Component Value Date/Time   NA 136 04/22/2015 0830   K 3.9 04/22/2015 0830   CL 105 04/22/2015 0830   CO2 24 04/22/2015 0830   GLUCOSE 88 04/22/2015 0830   BUN 5* 04/22/2015 0830   CREATININE 0.86 04/22/2015 0830   CALCIUM 9.1 04/22/2015 0830   PROT 7.3 04/22/2015 0830   ALBUMIN 4.1 04/22/2015 0830   AST 16 04/22/2015 0830   ALT 12* 04/22/2015 0830   ALKPHOS 125 04/22/2015 0830   BILITOT 0.5 04/22/2015 0830   GFRNONAA >60 04/22/2015 0830   GFRAA >60 04/22/2015 0830   No results found for: CHOL Lab Results  Component Value Date   HGBA1C 5.4 06/14/2013    Lab Results  Component Value Date   VITAMINB12  741 06/14/2013   Lab Results  Component Value Date   TSH 2.520 06/14/2013    01/20/13 CT cervical spine: There is anterior cervical fusion from C3-C5. There is a  bony bridging of the cervical spine from C3-C7. No evidence of  acute fracture. Normal facet articulation. Normal craniocervical  junction. There is no prevertebral soft tissue swelling. No evidence of  epidural or paraspinal hematoma.   06/26/13 MRI LUMBAR - focal central disc protrusion at L2-3 and mild facet and disc degenerative changes throughout but without significant compression.  04/07/15 CT HEAD / CERVICAL  1. No acute intracranial abnormality. No significant change. 2. No cervical spine acute fracture or subluxation. Degenerative changes as described above. Stable postsurgical changes as described above.   ASSESSMENT AND PLAN  59 y.o. year old female here initially with diffuse pain, mainly low back and legs, and secondary weakness. Continues with leg, joint, back pain. Also with numerous office and ER visists for falls, pain, weakness, chest pain. Diagnostic testing unremarkable. Psychosocial stressors are high and uncontrolled.  Dx: pain syndrome, conversion reaction, fibromyalgia   PLAN: 1. I recommend physical therapy and pain management clinic referrals; patient will discuss with PCP 2. Consider cymbalta (to help with depression and pain; patient will discuss with PCP) 3. If patient needs long term narcotics, then will need to establish with pain management clinic  Return if symptoms worsen or fail to improve, for return to PCP.    Penni Bombard, MD 10/04/1476, 2:95 AM Certified in Neurology, Neurophysiology and Neuroimaging  [facility] [address] [place], [place] [address] [phone]

## 2015-06-21 ENCOUNTER — Encounter: Payer: Self-pay | Admitting: Diagnostic Neuroimaging

## 2015-06-21 ENCOUNTER — Ambulatory Visit (INDEPENDENT_AMBULATORY_CARE_PROVIDER_SITE_OTHER): Payer: Medicare Other | Admitting: Diagnostic Neuroimaging

## 2015-06-21 VITALS — BP 130/77 | HR 83 | Ht 64.0 in | Wt 130.0 lb

## 2015-06-21 DIAGNOSIS — F329 Major depressive disorder, single episode, unspecified: Secondary | ICD-10-CM | POA: Diagnosis not present

## 2015-06-21 DIAGNOSIS — F449 Dissociative and conversion disorder, unspecified: Secondary | ICD-10-CM

## 2015-06-21 DIAGNOSIS — M797 Fibromyalgia: Secondary | ICD-10-CM

## 2015-06-21 DIAGNOSIS — F32A Depression, unspecified: Secondary | ICD-10-CM

## 2015-06-21 DIAGNOSIS — R269 Unspecified abnormalities of gait and mobility: Secondary | ICD-10-CM | POA: Diagnosis not present

## 2015-06-21 DIAGNOSIS — R55 Syncope and collapse: Secondary | ICD-10-CM | POA: Diagnosis not present

## 2015-06-21 DIAGNOSIS — F419 Anxiety disorder, unspecified: Secondary | ICD-10-CM | POA: Insufficient documentation

## 2015-06-21 NOTE — Progress Notes (Signed)
[facility]  PATIENT: Lori [name] DOB: 02/19/1956  REFERRING CLINICIAN:  HISTORY FROM: patient, friend and husband REASON FOR VISIT: follow up   HISTORICAL  CHIEF COMPLAINT:  No chief complaint on file.   HISTORY OF PRESENT ILLNESS:   UPDATE 06/21/15: Since last visit, continue to have high stress levels and family disagreements. Had syncope events 1-2 weeks ago, but didn't go to ER. It seemed to be triggered by an argument with her sister.  UPDATE 05/15/15: Since last visit, continues with significant pain in legs. Has not seen pain mgmt or psychiatry. Still with weakness, cramps, pain in legs.   UPDATE 09/21/13: Since last visit, continues to have pain (now right foot, but also chest pain, arm pain, back pain). Had cardiology and ER evaluation of chest pain, with unremarkable test results.  My evaluation from last visit also unremarkable (MRI lumbar, labs). Patient still under significant psychosocial stress (family, siblings, mother) and patient begins to cry in the office. Husband and friend agree that stress is a sig factor. She declines to see psychiatry. She takes xanax at bedtime.  PRIOR HPI (06/14/13): 59 year old right-handed female with hypercholesterolemia, heart disease, migraine, depression, anxiety, smoking, here for evaluation of lower extremity pain. July 2014 patient was at home, with nausea and vomiting and passed out. She fell down to the ground and patient's husband found her at home in a semi-conscious state. Patient was taken to the emergency room for further evaluation. Apparently she was diagnosed with low blood pressure and anemia. Ever since that time she's had low back pain, bilateral leg pain and weakness. She saw orthopedic surgery without a specific diagnosis. She also has diffuse upper and lower extremity weakness and pain.   REVIEW OF SYSTEMS: Full 14 system review of systems performed and notable only for as per HPI.     ALLERGIES: Allergies  Allergen Reactions  . Bee Venom Swelling  . Codeine Nausea Only    CAUSES ULCERS  . Ibuprofen Nausea And Vomiting  . Propoxyphene N-Acetaminophen Nausea Only  . Toradol [Ketorolac Tromethamine] Nausea And Vomiting  . Hydrocodone-Acetaminophen Nausea Only    REACTION: "sick"  . Latex Rash    HOME MEDICATIONS: Outpatient Encounter Prescriptions as of 06/21/2015  Medication Sig  . ALPRAZolam (XANAX) 1 MG tablet Take 1 tablet by mouth as needed for anxiety.   Marland Kitchen aspirin 81 MG tablet Take 81 mg by mouth daily.  . butalbital-acetaminophen-caffeine (FIORICET, ESGIC) 50-325-40 MG per tablet Take 1 tablet by mouth 2 (two) times daily as needed for headache.  . cyclobenzaprine (FLEXERIL) 10 MG tablet Take 1 tablet (10 mg total) by mouth 2 (two) times daily as needed for muscle spasms.  Marland Kitchen dexlansoprazole (DEXILANT) 60 MG capsule Take 60 mg by mouth daily.  . diclofenac (VOLTAREN) 75 MG EC tablet TK 1 T PO BID WF PRN P  . diphenoxylate-atropine (LOMOTIL) 2.5-0.025 MG per tablet Take 1 tablet by mouth every 4 (four) hours as needed for diarrhea or loose stools.   . docusate sodium (COLACE) 100 MG capsule Take 100 mg by mouth 2 (two) times daily.  Marland Kitchen HYDROcodone-acetaminophen (NORCO) 7.5-325 MG per tablet Take 2 tablets by mouth every 4 (four) hours as needed for moderate pain or severe pain. (Patient not taking: Reported on 04/18/2015)  . ketoconazole (NIZORAL) 200 MG tablet Take 1 tablet (200 mg total) by mouth daily.  Marland Kitchen loperamide (IMODIUM) 2 MG capsule Take 1 capsule by mouth daily.  Marland Kitchen LORazepam (ATIVAN) 1 MG tablet  Take 1 tablet (1 mg total) by mouth 3 (three) times daily as needed (dizziness / nausea). (Patient not taking: Reported on 04/19/2015)  . meclizine (ANTIVERT) 25 MG tablet Take 1 tablet by mouth daily.  . meloxicam (MOBIC) 15 MG tablet Take 1 tablet (15 mg total) by mouth daily. (Patient not taking: Reported on 05/15/2015)  . mometasone-formoterol (DULERA) 200-5  MCG/ACT AERO Inhale 2 puffs into the lungs 2 (two) times daily.  . montelukast (SINGULAIR) 10 MG tablet Take 10 mg by mouth at bedtime.   . nitroGLYCERIN (NITROSTAT) 0.4 MG SL tablet Place 0.4 mg under the tongue every 5 (five) minutes as needed for chest pain.  Marland Kitchen omeprazole (PRILOSEC) 20 MG capsule TK 1 C PO QD  . ondansetron (ZOFRAN) 4 MG tablet Take 4 mg by mouth every 6 (six) hours as needed. For nausea  . oxybutynin (DITROPAN) 5 MG tablet Take 5 mg by mouth 2 (two) times daily.  Marland Kitchen oxyCODONE-acetaminophen (PERCOCET/ROXICET) 5-325 MG per tablet Take 1-2 tablets by mouth every 6 (six) hours as needed for severe pain. (Patient not taking: Reported on 04/19/2015)  . PROAIR HFA 108 (90 BASE) MCG/ACT inhaler Inhale 1 mcg into the lungs 2 (two) times daily.  . promethazine (PHENERGAN) 25 MG tablet Take 1 tablet by mouth as needed for nausea.   . simvastatin (ZOCOR) 20 MG tablet TK 1 T PO  QPM BEFORE BEDTIME  . theophylline (THEODUR) 200 MG 12 hr tablet TK 2 TS PO D  . valACYclovir (VALTREX) 500 MG tablet TK 1 T PO  ONCE D   No facility-administered encounter medications on file as of 06/21/2015.    PAST MEDICAL HISTORY: Past Medical History  Diagnosis Date  . COPD (chronic obstructive pulmonary disease) with chronic bronchitis (HCC)     & Emphysema  . Asthma   . Barrett esophagus 2007  . Hiatal hernia [id number]  . Duodenitis without mention of hemorrhage 2007  . Esophageal stricture   . Esophageal reflux 2007  . Anxiety   . Depression   . Multiple fractures     from falls, fx rt. elbow, fx left wrist, bilateral ankles  . Allergy   . Ulcer   . H/O hiatal hernia   . Chest pain 05-02-2009    echo  EF 55%  . H/O cardiovascular stress test 05-02-2009    low risk scan, neg. for ischemia, no ekg changes  . Full dentures   . Acute MI ([facility])     x3    PAST SURGICAL HISTORY: Past Surgical History  Procedure Laterality Date  . Abdominal hysterectomy    . Neck surgery    . Cholecystectomy     . Elbow fracture surgery      right  . Wrist fracture surgery      left, has plate  . Colonoscopy    . Nm myoview ltd  07/02/2013    Normal LV function, EF 83%. (Likely overestimated due to low volume);; no evidence of ischemia or infarction. Normal wall motion.  . Lower extremity arterial dopplers  07/06/2013    RABI 1.0, LABI 1.1.; No evidence of significant vascular atherogenic plaque  . Tonsillectomy    . Knee arthroscopy with excision plica Left 05/14/9370    Procedure: KNEE ARTHROSCOPY WITH EXCISION PLICA;  Surgeon: Marybelle Killings, MD;  Location: [facility];  Service: Orthopedics;  Laterality: Left;  . Chondroplasty Left 01/06/2015    Procedure: CHONDROPLASTY;  Surgeon: Marybelle Killings, MD;  Location: [facility];  Service: Orthopedics;  Laterality: Left;    FAMILY HISTORY: Family History  Problem Relation Age of Onset  . Emphysema Father   . Colon cancer Neg Hx   . Diabetes Maternal Grandmother   . Diabetes Maternal Aunt   . Diabetes      mat. cousin  . Heart disease Brother   . Dementia Mother     SOCIAL HISTORY:  Social History   Social History  . Marital Status: Married    Spouse Name: [name]  . Number of Children: 2  . Years of Education: HS   Occupational History  . disable    Social History Main Topics  . Smoking status: Current Every Day Smoker -- 1.50 packs/day for 21 years    Types: Cigarettes  . Smokeless tobacco: Never Used     Comment: tobaccco info given 03/06/12, 1.5 PPD 05/15/15  . Alcohol Use: No  . Drug Use: No  . Sexual Activity: Not on file   Other Topics Concern  . Not on file   Social History Narrative   Patient lives at home spouse - [name].  Has 2 daughters (20 & 21).   Currently "disabled".   Caffeine Use: tea 4 glasses daily.    Smokes ~1 1/2 PPD  05/15/15   Walks everyday - no set routine.     PHYSICAL EXAM  Filed Vitals:   06/21/15 1300  BP: 130/77  Pulse: 83  Height: 5\' 4"  (1.626 m)  Weight:  130 lb (58.968 kg)    Not recorded      Body mass index is 22.3 kg/(m^2).  GENERAL EXAM: Patient is in no distress.  CARDIOVASCULAR: Regular rate and rhythm, no murmurs, no carotid bruits  NEUROLOGIC: MENTAL STATUS: awake, alert, language fluent, comprehension intact, naming intact CRANIAL NERVE: pupils equal and reactive to light, visual fields full to confrontation, extraocular muscles intact, no nystagmus, facial sensation and strength symmetric, uvula midline, shoulder shrug symmetric, tongue midline. MOTOR: BUE 5, BLE 5; normal bulk and tone SENSORY: normal and symmetric to light touch; HYPERSENSITIVE IN FEET. COORDINATION: finger-nose-finger, fine finger movements normal REFLEXES: deep tendon reflexes present and symmetric GAIT/STATION: ANTALGIC, SLOW GAIT. UNSTEADY.    DIAGNOSTIC DATA (LABS, IMAGING, TESTING) - I reviewed patient records, labs, notes, testing and imaging myself where available.  Lab Results  Component Value Date   WBC 10.5 04/22/2015   HGB 12.1 04/22/2015   HCT 36.7 04/22/2015   MCV 84.0 04/22/2015   PLT 390 04/22/2015      Component Value Date/Time   NA 136 04/22/2015 0830   K 3.9 04/22/2015 0830   CL 105 04/22/2015 0830   CO2 24 04/22/2015 0830   GLUCOSE 88 04/22/2015 0830   BUN 5* 04/22/2015 0830   CREATININE 0.86 04/22/2015 0830   CALCIUM 9.1 04/22/2015 0830   PROT 7.3 04/22/2015 0830   ALBUMIN 4.1 04/22/2015 0830   AST 16 04/22/2015 0830   ALT 12* 04/22/2015 0830   ALKPHOS 125 04/22/2015 0830   BILITOT 0.5 04/22/2015 0830   GFRNONAA >60 04/22/2015 0830   GFRAA >60 04/22/2015 0830   No results found for: CHOL Lab Results  Component Value Date   HGBA1C 5.4 06/14/2013   Lab Results  Component Value Date   [id number] 761 06/14/2013   Lab Results  Component Value Date   TSH 2.520 06/14/2013    01/20/13 CT cervical spine - There is anterior cervical fusion from C3-C5. There is a bony bridging of the cervical spine  from C3-C7.  No evidence of acute fracture. Normal facet articulation. Normal craniocervical junction. There is no prevertebral soft tissue swelling. No evidence of epidural or paraspinal hematoma.  06/26/13 MRI LUMBAR  - focal central disc protrusion at L2-3 and mild facet and disc degenerative changes throughout but without significant compression.  04/07/15 CT HEAD / CERVICAL  1. No acute intracranial abnormality. No significant change. 2. No cervical spine acute fracture or subluxation. Degenerative changes as described above. Stable postsurgical changes as described above.   ASSESSMENT AND PLAN  59 y.o. year old female here initially with diffuse pain, mainly low back and legs, and secondary weakness. Continues with leg, joint, back pain and intermittent syncope. Also with numerous office and ER visists for falls, pain, weakness, chest pain. Diagnostic testing unremarkable. Psychosocial stressors are high and uncontrolled.  Dx: pain syndrome, conversion reaction, fibromyalgia   PLAN: 1. I recommend physical therapy, pain management clinic and psychiatry referrals; may also ned marital counseling; patient will discuss with PCP  Return if symptoms worsen or fail to improve, for return to PCP.    Penni Bombard, MD 50/0/9381, 8:29 PM Certified in Neurology, Neurophysiology and Neuroimaging  [facility] [address] [place],  [address] [phone]

## 2015-06-21 NOTE — Patient Instructions (Signed)

## 2015-06-27 ENCOUNTER — Emergency Department (HOSPITAL_COMMUNITY): Payer: Medicare Other

## 2015-06-27 ENCOUNTER — Encounter (HOSPITAL_COMMUNITY): Payer: Self-pay

## 2015-06-27 ENCOUNTER — Emergency Department (HOSPITAL_COMMUNITY)
Admission: EM | Admit: 2015-06-27 | Discharge: 2015-06-27 | Disposition: A | Discharge status: Home / Self Care | Payer: Medicare Other | Attending: Emergency Medicine | Admitting: Emergency Medicine

## 2015-06-27 DIAGNOSIS — Y9389 Activity, other specified: Secondary | ICD-10-CM | POA: Diagnosis not present

## 2015-06-27 DIAGNOSIS — K2270 Barrett's esophagus without dysplasia: Secondary | ICD-10-CM | POA: Diagnosis not present

## 2015-06-27 DIAGNOSIS — Z91040 Latex allergy status: Secondary | ICD-10-CM | POA: Diagnosis not present

## 2015-06-27 DIAGNOSIS — Z7982 Long term (current) use of aspirin: Secondary | ICD-10-CM | POA: Diagnosis not present

## 2015-06-27 DIAGNOSIS — K219 Gastro-esophageal reflux disease without esophagitis: Secondary | ICD-10-CM | POA: Diagnosis not present

## 2015-06-27 DIAGNOSIS — Y998 Other external cause status: Secondary | ICD-10-CM | POA: Insufficient documentation

## 2015-06-27 DIAGNOSIS — S060X0A Concussion without loss of consciousness, initial encounter: Secondary | ICD-10-CM | POA: Diagnosis not present

## 2015-06-27 DIAGNOSIS — Z79899 Other long term (current) drug therapy: Secondary | ICD-10-CM | POA: Diagnosis not present

## 2015-06-27 DIAGNOSIS — I252 Old myocardial infarction: Secondary | ICD-10-CM | POA: Diagnosis not present

## 2015-06-27 DIAGNOSIS — Y9289 Other specified places as the place of occurrence of the external cause: Secondary | ICD-10-CM | POA: Diagnosis not present

## 2015-06-27 DIAGNOSIS — W1839XA Other fall on same level, initial encounter: Secondary | ICD-10-CM | POA: Diagnosis not present

## 2015-06-27 DIAGNOSIS — F329 Major depressive disorder, single episode, unspecified: Secondary | ICD-10-CM | POA: Diagnosis not present

## 2015-06-27 DIAGNOSIS — Z7951 Long term (current) use of inhaled steroids: Secondary | ICD-10-CM | POA: Diagnosis not present

## 2015-06-27 DIAGNOSIS — F419 Anxiety disorder, unspecified: Secondary | ICD-10-CM | POA: Diagnosis not present

## 2015-06-27 DIAGNOSIS — Z720 Tobacco use: Secondary | ICD-10-CM | POA: Insufficient documentation

## 2015-06-27 DIAGNOSIS — G8929 Other chronic pain: Secondary | ICD-10-CM | POA: Diagnosis not present

## 2015-06-27 DIAGNOSIS — R51 Headache: Secondary | ICD-10-CM

## 2015-06-27 DIAGNOSIS — S0990XA Unspecified injury of head, initial encounter: Secondary | ICD-10-CM | POA: Diagnosis present

## 2015-06-27 DIAGNOSIS — J449 Chronic obstructive pulmonary disease, unspecified: Secondary | ICD-10-CM | POA: Insufficient documentation

## 2015-06-27 LAB — URINALYSIS, ROUTINE W REFLEX MICROSCOPIC
BILIRUBIN URINE: NEGATIVE
GLUCOSE, UA: NEGATIVE mg/dL
Ketones, ur: NEGATIVE mg/dL
Leukocytes, UA: NEGATIVE
Nitrite: NEGATIVE
PH: 7 (ref 5.0–8.0)
Protein, ur: NEGATIVE mg/dL
Specific Gravity, Urine: 1.004 — ABNORMAL LOW (ref 1.005–1.030)
Urobilinogen, UA: 0.2 mg/dL (ref 0.0–1.0)

## 2015-06-27 LAB — URINE MICROSCOPIC-ADD ON

## 2015-06-27 LAB — CBC
HCT: 36.3 % (ref 36.0–46.0)
Hemoglobin: 12.1 g/dL (ref 12.0–15.0)
MCH: 27.3 pg (ref 26.0–34.0)
MCHC: 33.3 g/dL (ref 30.0–36.0)
MCV: 81.8 fL (ref 78.0–100.0)
PLATELETS: 303 10*3/uL (ref 150–400)
RBC: 4.44 MIL/uL (ref 3.87–5.11)
RDW: 14.5 % (ref 11.5–15.5)
WBC: 7.5 10*3/uL (ref 4.0–10.5)

## 2015-06-27 LAB — BASIC METABOLIC PANEL
Anion gap: 7 (ref 5–15)
BUN: 6 mg/dL (ref 6–20)
CALCIUM: 9.3 mg/dL (ref 8.9–10.3)
CHLORIDE: 113 mmol/L — AB (ref 101–111)
CO2: 18 mmol/L — AB (ref 22–32)
CREATININE: 0.82 mg/dL (ref 0.44–1.00)
GFR calc Af Amer: >60 mL/min (ref >60)
GFR calc non Af Amer: >60 mL/min (ref >60)
GLUCOSE: 91 mg/dL (ref 65–99)
Potassium: 3.9 mmol/L (ref 3.5–5.1)
Sodium: 138 mmol/L (ref 135–145)

## 2015-06-27 MED ORDER — SODIUM CHLORIDE 0.9 % IV BOLUS (SEPSIS)
1000.0000 mL | Freq: Once | INTRAVENOUS | Status: AC
  Administered 2015-06-27: 1000 mL via INTRAVENOUS

## 2015-06-27 MED ORDER — ACETAMINOPHEN 500 MG PO TABS
1000.0000 mg | ORAL_TABLET | Freq: Once | ORAL | Status: AC
  Administered 2015-06-27: 1000 mg via ORAL
  Filled 2015-06-27: qty 2

## 2015-06-27 MED ORDER — METOCLOPRAMIDE HCL 5 MG/ML IJ SOLN
10.0000 mg | Freq: Once | INTRAMUSCULAR | Status: AC
  Administered 2015-06-27: 10 mg via INTRAVENOUS
  Filled 2015-06-27: qty 2

## 2015-06-27 MED ORDER — MECLIZINE HCL 25 MG PO TABS
25.0000 mg | ORAL_TABLET | Freq: Once | ORAL | Status: AC
  Administered 2015-06-27: 25 mg via ORAL
  Filled 2015-06-27: qty 1

## 2015-06-27 MED ORDER — DIPHENHYDRAMINE HCL 25 MG PO CAPS
25.0000 mg | ORAL_CAPSULE | Freq: Once | ORAL | Status: AC
  Administered 2015-06-27: 25 mg via ORAL
  Filled 2015-06-27: qty 1

## 2015-06-27 MED ORDER — LORAZEPAM 1 MG PO TABS
1.0000 mg | ORAL_TABLET | Freq: Once | ORAL | Status: AC
  Administered 2015-06-27: 1 mg via ORAL
  Filled 2015-06-27: qty 1

## 2015-06-27 MED ORDER — DIPHENHYDRAMINE HCL 50 MG/ML IJ SOLN
25.0000 mg | Freq: Once | INTRAMUSCULAR | Status: DC
  Filled 2015-06-27: qty 1

## 2015-06-27 NOTE — ED Notes (Signed)
Off floor for testing 

## 2015-06-27 NOTE — ED Notes (Signed)
Back from MRI.

## 2015-06-27 NOTE — ED Notes (Signed)
EKG given to Dr Mingo Amber.
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
## 2015-06-27 NOTE — ED Notes (Signed)
Pt c/o headache, dizziness, weakness, unsteady gait, and tingling in BUE after a fall in September.  Pain score 10/10.  Pt has not taken anything for pain.  Pt reports being seen by a Neurologist last week.  Sts "he wouldn't do anything for me.  My family doctor wanted me to have a test on my head, but that doctor refused.  He said that he didn't want to get in trouble, so he wouldn't do anything."

## 2015-06-27 NOTE — Discharge Instructions (Signed)
General Headache Without Cause A headache is pain or discomfort felt around the head or neck area. The specific cause of a headache may not be found. There are many causes and types of headaches. A few common ones are:  Tension headaches.  Migraine headaches.  Cluster headaches.  Chronic daily headaches. HOME CARE INSTRUCTIONS  Watch your condition for any changes. Take these steps to help with your condition: Managing Pain  Take over-the-counter and prescription medicines only as told by your health care provider.  Lie down in a dark, quiet room when you have a headache.  If directed, apply ice to the head and neck area:  Put ice in a plastic bag.  Place a towel between your skin and the bag.  Leave the ice on for 20 minutes, 2-3 times per day.  Use a heating pad or hot shower to apply heat to the head and neck area as told by your health care provider.  Keep lights dim if bright lights bother you or make your headaches worse. Eating and Drinking  Eat meals on a regular schedule.  Limit alcohol use.  Decrease the amount of caffeine you drink, or stop drinking caffeine. General Instructions  Keep all follow-up visits as told by your health care provider. This is important.  Keep a headache journal to help find out what may trigger your headaches. For example, write down:  What you eat and drink.  How much sleep you get.  Any change to your diet or medicines.  Try massage or other relaxation techniques.  Limit stress.  Sit up straight, and do not tense your muscles.  Do not use tobacco products, including cigarettes, chewing tobacco, or e-cigarettes. If you need help quitting, ask your health care provider.  Exercise regularly as told by your health care provider.  Sleep on a regular schedule. Get 7-9 hours of sleep, or the amount recommended by your health care provider. SEEK MEDICAL CARE IF:   Your symptoms are not helped by medicine.  You have a  headache that is different from the usual headache.  You have nausea or you vomit.  You have a fever. SEEK IMMEDIATE MEDICAL CARE IF:   Your headache becomes severe.  You have repeated vomiting.  You have a stiff neck.  You have a loss of vision.  You have problems with speech.  You have pain in the eye or ear.  You have muscular weakness or loss of muscle control.  You lose your balance or have trouble walking.  You feel faint or pass out.  You have confusion.   This information is not intended to replace advice given to you by your health care provider. Make sure you discuss any questions you have with your health care provider.   Document Released: 09/02/2005 Document Revised: 05/24/2015 Document Reviewed: 12/26/2014 Elsevier Interactive Patient Education 2016 [place], Adult A concussion, or closed-head injury, is a brain injury caused by a direct blow to the head or by a quick and sudden movement (jolt) of the head or neck. Concussions are usually not life-threatening. Even so, the effects of a concussion can be serious. If you have had a concussion before, you are more likely to experience concussion-like symptoms after a direct blow to the head.  CAUSES  Direct blow to the head, such as from running into another player during a soccer game, being hit in a fight, or hitting your head on a hard surface.  A jolt of the head  or neck that causes the brain to move back and forth inside the skull, such as in a car crash. SIGNS AND SYMPTOMS The signs of a concussion can be hard to notice. Early on, they may be missed by you, family members, and health care providers. You may look fine but act or feel differently. Symptoms are usually temporary, but they may last for days, weeks, or even longer. Some symptoms may appear right away while others may not show up for hours or days. Every head injury is different. Symptoms include:  Mild to moderate headaches  that will not go away.  A feeling of pressure inside your head.  Having more trouble than usual:  Learning or remembering things you have heard.  Answering questions.  Paying attention or concentrating.  Organizing daily tasks.  Making decisions and solving problems.  Slowness in thinking, acting or reacting, speaking, or reading.  Getting lost or being easily confused.  Feeling tired all the time or lacking energy (fatigued).  Feeling drowsy.  Sleep disturbances.  Sleeping more than usual.  Sleeping less than usual.  Trouble falling asleep.  Trouble sleeping (insomnia).  Loss of balance or feeling lightheaded or dizzy.  Nausea or vomiting.  Numbness or tingling.  Increased sensitivity to:  Sounds.  Lights.  Distractions.  Vision problems or eyes that tire easily.  Diminished sense of taste or smell.  Ringing in the ears.  Mood changes such as feeling sad or anxious.  Becoming easily irritated or angry for little or no reason.  Lack of motivation.  Seeing or hearing things other people do not see or hear (hallucinations). DIAGNOSIS Your health care provider can usually diagnose a concussion based on a description of your injury and symptoms. He or she will ask whether you passed out (lost consciousness) and whether you are having trouble remembering events that happened right before and during your injury. Your evaluation might include:  A brain scan to look for signs of injury to the brain. Even if the test shows no injury, you may still have a concussion.  Blood tests to be sure other problems are not present. TREATMENT  Concussions are usually treated in an emergency department, in urgent care, or at a clinic. You may need to stay in the hospital overnight for further treatment.  Tell your health care provider if you are taking any medicines, including prescription medicines, over-the-counter medicines, and natural remedies. Some medicines,  such as blood thinners (anticoagulants) and aspirin, may increase the chance of complications. Also tell your health care provider whether you have had alcohol or are taking illegal drugs. This information may affect treatment.  Your health care provider will send you home with important instructions to follow.  How fast you will recover from a concussion depends on many factors. These factors include how severe your concussion is, what part of your brain was injured, your age, and how healthy you were before the concussion.  Most people with mild injuries recover fully. Recovery can take time. In general, recovery is slower in older persons. Also, persons who have had a concussion in the past or have other medical problems may find that it takes longer to recover from their current injury. HOME CARE INSTRUCTIONS General Instructions  Carefully follow the directions your health care provider gave you.  Only take over-the-counter or prescription medicines for pain, discomfort, or fever as directed by your health care provider.  Take only those medicines that your health care provider has approved.  Do  not drink alcohol until your health care provider says you are well enough to do so. Alcohol and certain other drugs may slow your recovery and can put you at risk of further injury.  If it is harder than usual to remember things, write them down.  If you are easily distracted, try to do one thing at a time. For example, do not try to watch TV while fixing dinner.  Talk with family members or close friends when making important decisions.  Keep all follow-up appointments. Repeated evaluation of your symptoms is recommended for your recovery.  Watch your symptoms and tell others to do the same. Complications sometimes occur after a concussion. Older adults with a brain injury may have a higher risk of serious complications, such as a blood clot on the brain.  Tell your teachers, school nurse,  school counselor, coach, athletic trainer, or work Freight forwarder about your injury, symptoms, and restrictions. Tell them about what you can or cannot do. They should watch for:  Increased problems with attention or concentration.  Increased difficulty remembering or learning new information.  Increased time needed to complete tasks or assignments.  Increased irritability or decreased ability to cope with stress.  Increased symptoms.  Rest. Rest helps the brain to heal. Make sure you:  Get plenty of sleep at night. Avoid staying up late at night.  Keep the same bedtime hours on weekends and weekdays.  Rest during the day. Take daytime naps or rest breaks when you feel tired.  Limit activities that require a lot of thought or concentration. These include:  Doing homework or job-related work.  Watching TV.  Working on the computer.  Avoid any situation where there is potential for another head injury (football, hockey, soccer, basketball, martial arts, downhill snow sports and horseback riding). Your condition will get worse every time you experience a concussion. You should avoid these activities until you are evaluated by the appropriate follow-up health care providers. Returning To Your Regular Activities You will need to return to your normal activities slowly, not all at once. You must give your body and brain enough time for recovery.  Do not return to sports or other athletic activities until your health care provider tells you it is safe to do so.  Ask your health care provider when you can drive, ride a bicycle, or operate heavy machinery. Your ability to react may be slower after a brain injury. Never do these activities if you are dizzy.  Ask your health care provider about when you can return to work or school. Preventing Another Concussion It is very important to avoid another brain injury, especially before you have recovered. In rare cases, another injury can lead to  permanent brain damage, brain swelling, or death. The risk of this is greatest during the first 7-10 days after a head injury. Avoid injuries by:  Wearing a seat belt when riding in a car.  Drinking alcohol only in moderation.  Wearing a helmet when biking, skiing, skateboarding, skating, or doing similar activities.  Avoiding activities that could lead to a second concussion, such as contact or recreational sports, until your health care provider says it is okay.  Taking safety measures in your home.  Remove clutter and tripping hazards from floors and stairways.  Use grab bars in bathrooms and handrails by stairs.  Place non-slip mats on floors and in bathtubs.  Improve lighting in dim areas. SEEK MEDICAL CARE IF:  You have increased problems paying attention or concentrating.  You have increased difficulty remembering or learning new information.  You need more time to complete tasks or assignments than before.  You have increased irritability or decreased ability to cope with stress.  You have more symptoms than before. Seek medical care if you have any of the following symptoms for more than 2 weeks after your injury:  Lasting (chronic) headaches.  Dizziness or balance problems.  Nausea.  Vision problems.  Increased sensitivity to noise or light.  Depression or mood swings.  Anxiety or irritability.  Memory problems.  Difficulty concentrating or paying attention.  Sleep problems.  Feeling tired all the time. SEEK IMMEDIATE MEDICAL CARE IF:  You have severe or worsening headaches. These may be a sign of a blood clot in the brain.  You have weakness (even if only in one hand, leg, or part of the face).  You have numbness.  You have decreased coordination.  You vomit repeatedly.  You have increased sleepiness.  One pupil is larger than the other.  You have convulsions.  You have slurred speech.  You have increased confusion. This may be a  sign of a blood clot in the brain.  You have increased restlessness, agitation, or irritability.  You are unable to recognize people or places.  You have neck pain.  It is difficult to wake you up.  You have unusual behavior changes.  You lose consciousness. MAKE SURE YOU:  Understand these instructions.  Will watch your condition.  Will get help right away if you are not doing well or get worse.   This information is not intended to replace advice given to you by your health care provider. Make sure you discuss any questions you have with your health care provider.   Document Released: 11/23/2003 Document Revised: 09/23/2014 Document Reviewed: 03/25/2013 Elsevier Interactive Patient Education [employer].

## 2015-08-24 ENCOUNTER — Encounter (HOSPITAL_COMMUNITY): Payer: Self-pay | Admitting: Emergency Medicine

## 2015-08-24 ENCOUNTER — Emergency Department (HOSPITAL_COMMUNITY): Payer: Medicare Other

## 2015-08-24 ENCOUNTER — Emergency Department (HOSPITAL_COMMUNITY)
Admission: EM | Admit: 2015-08-24 | Discharge: 2015-08-24 | Disposition: A | Discharge status: Home / Self Care | Payer: Medicare Other | Attending: Emergency Medicine | Admitting: Emergency Medicine

## 2015-08-24 DIAGNOSIS — I252 Old myocardial infarction: Secondary | ICD-10-CM | POA: Insufficient documentation

## 2015-08-24 DIAGNOSIS — Z7982 Long term (current) use of aspirin: Secondary | ICD-10-CM | POA: Insufficient documentation

## 2015-08-24 DIAGNOSIS — F419 Anxiety disorder, unspecified: Secondary | ICD-10-CM | POA: Diagnosis not present

## 2015-08-24 DIAGNOSIS — J449 Chronic obstructive pulmonary disease, unspecified: Secondary | ICD-10-CM | POA: Insufficient documentation

## 2015-08-24 DIAGNOSIS — R112 Nausea with vomiting, unspecified: Secondary | ICD-10-CM | POA: Insufficient documentation

## 2015-08-24 DIAGNOSIS — J069 Acute upper respiratory infection, unspecified: Secondary | ICD-10-CM | POA: Diagnosis not present

## 2015-08-24 DIAGNOSIS — Z7951 Long term (current) use of inhaled steroids: Secondary | ICD-10-CM | POA: Insufficient documentation

## 2015-08-24 DIAGNOSIS — Z91040 Latex allergy status: Secondary | ICD-10-CM | POA: Diagnosis not present

## 2015-08-24 DIAGNOSIS — F329 Major depressive disorder, single episode, unspecified: Secondary | ICD-10-CM | POA: Insufficient documentation

## 2015-08-24 DIAGNOSIS — K2270 Barrett's esophagus without dysplasia: Secondary | ICD-10-CM | POA: Insufficient documentation

## 2015-08-24 DIAGNOSIS — K219 Gastro-esophageal reflux disease without esophagitis: Secondary | ICD-10-CM | POA: Insufficient documentation

## 2015-08-24 DIAGNOSIS — Z872 Personal history of diseases of the skin and subcutaneous tissue: Secondary | ICD-10-CM | POA: Diagnosis not present

## 2015-08-24 DIAGNOSIS — F17210 Nicotine dependence, cigarettes, uncomplicated: Secondary | ICD-10-CM | POA: Insufficient documentation

## 2015-08-24 DIAGNOSIS — Z8781 Personal history of (healed) traumatic fracture: Secondary | ICD-10-CM | POA: Insufficient documentation

## 2015-08-24 DIAGNOSIS — Z79899 Other long term (current) drug therapy: Secondary | ICD-10-CM | POA: Diagnosis not present

## 2015-08-24 DIAGNOSIS — R197 Diarrhea, unspecified: Secondary | ICD-10-CM | POA: Diagnosis not present

## 2015-08-24 DIAGNOSIS — R05 Cough: Secondary | ICD-10-CM | POA: Diagnosis present

## 2015-08-24 LAB — I-STAT CHEM 8, ED
BUN: 10 mg/dL (ref 6–20)
CALCIUM ION: 1.18 mmol/L (ref 1.12–1.23)
CHLORIDE: 108 mmol/L (ref 101–111)
Creatinine, Ser: 1.1 mg/dL — ABNORMAL HIGH (ref 0.44–1.00)
GLUCOSE: 116 mg/dL — AB (ref 65–99)
HCT: 35 % — ABNORMAL LOW (ref 36.0–46.0)
Hemoglobin: 11.9 g/dL — ABNORMAL LOW (ref 12.0–15.0)
Potassium: 3.6 mmol/L (ref 3.5–5.1)
Sodium: 140 mmol/L (ref 135–145)
TCO2: 19 mmol/L (ref 0–100)

## 2015-08-24 LAB — URINE MICROSCOPIC-ADD ON

## 2015-08-24 LAB — CBC WITH DIFFERENTIAL/PLATELET
Basophils Absolute: 0 10*3/uL (ref 0.0–0.1)
Basophils Relative: 0 %
EOS PCT: 3 %
Eosinophils Absolute: 0.2 10*3/uL (ref 0.0–0.7)
HCT: 33.7 % — ABNORMAL LOW (ref 36.0–46.0)
HEMOGLOBIN: 11 g/dL — AB (ref 12.0–15.0)
LYMPHS ABS: 2.3 10*3/uL (ref 0.7–4.0)
LYMPHS PCT: 33 %
MCH: 27 pg (ref 26.0–34.0)
MCHC: 32.6 g/dL (ref 30.0–36.0)
MCV: 82.6 fL (ref 78.0–100.0)
MONOS PCT: 5 %
Monocytes Absolute: 0.4 10*3/uL (ref 0.1–1.0)
NEUTROS PCT: 59 %
Neutro Abs: 4 10*3/uL (ref 1.7–7.7)
Platelets: 257 10*3/uL (ref 150–400)
RBC: 4.08 MIL/uL (ref 3.87–5.11)
RDW: 14.9 % (ref 11.5–15.5)
WBC: 6.9 10*3/uL (ref 4.0–10.5)

## 2015-08-24 LAB — URINALYSIS, ROUTINE W REFLEX MICROSCOPIC
BILIRUBIN URINE: NEGATIVE
GLUCOSE, UA: NEGATIVE mg/dL
HGB URINE DIPSTICK: NEGATIVE
Ketones, ur: NEGATIVE mg/dL
Nitrite: NEGATIVE
PH: 6.5 (ref 5.0–8.0)
Protein, ur: NEGATIVE mg/dL
SPECIFIC GRAVITY, URINE: 1.019 (ref 1.005–1.030)

## 2015-08-24 MED ORDER — OXYMETAZOLINE HCL 0.05 % NA SOLN
1.0000 | Freq: Once | NASAL | Status: AC
  Administered 2015-08-24: 1 via NASAL
  Filled 2015-08-24: qty 15

## 2015-08-24 MED ORDER — PREDNISONE 20 MG PO TABS
40.0000 mg | ORAL_TABLET | Freq: Every day | ORAL | Status: DC
Start: 2015-08-24 — End: 2015-10-03

## 2015-08-24 MED ORDER — IPRATROPIUM-ALBUTEROL 0.5-2.5 (3) MG/3ML IN SOLN
3.0000 mL | Freq: Once | RESPIRATORY_TRACT | Status: AC
  Administered 2015-08-24: 3 mL via RESPIRATORY_TRACT
  Filled 2015-08-24: qty 3

## 2015-08-24 MED ORDER — BENZONATATE 100 MG PO CAPS: 100.0000 mg | ORAL_CAPSULE | Freq: Three times a day (TID) | ORAL | Status: DC

## 2015-08-24 MED ORDER — PREDNISONE 20 MG PO TABS
60.0000 mg | ORAL_TABLET | Freq: Once | ORAL | Status: AC
  Administered 2015-08-24: 60 mg via ORAL
  Filled 2015-08-24: qty 3

## 2015-08-24 NOTE — ED Notes (Signed)
Per pt, states she has hold cold symptoms for 3 weeks-states she was treated with antibiotics but they haven't helped

## 2015-08-24 NOTE — ED Provider Notes (Signed)
CSN: [id number]     Arrival date & time 08/24/15  1808 History   First MD Initiated Contact with Patient 08/24/15 2107     Chief Complaint  Patient presents with  . URI     (Consider location/radiation/quality/duration/timing/severity/associated sxs/prior Treatment) HPI Lori [name] is a 59 y.o. female with history of COPD, asthma, current smoker, smokes pack and a half a day, depression, GERD, MI, presents to emergency department complaining of cough. She states she has had nasal congestion, sinus pressure, sneezing, cough for the last several weeks. She was seen by primary care doctor several days ago and states received "shot of antibiotic in the office and 3 days of azithromycin." Pt also taking allergy medications but states they are not helping. Pt states "i am tired of blowing my nose and coughing and need an IV." pt states she has fever, but states that she has not measured it. She states she feels short of breath from coughing. Admits to associated nausea and vomiting. States she cant keep anything down (pt is drinking coke as speaking to me, no vomiting.)  Past Medical History  Diagnosis Date  . COPD (chronic obstructive pulmonary disease) with chronic bronchitis (HCC)     & Emphysema  . Asthma   . Barrett esophagus 2007  . Hiatal hernia [id number]  . Duodenitis without mention of hemorrhage 2007  . Esophageal stricture   . Esophageal reflux 2007  . Anxiety   . Depression   . Multiple fractures     from falls, fx rt. elbow, fx left wrist, bilateral ankles  . Allergy   . Ulcer   . H/O hiatal hernia   . Chest pain 05-02-2009    echo  EF 55%  . H/O cardiovascular stress test 05-02-2009    low risk scan, neg. for ischemia, no ekg changes  . Full dentures   . Acute MI [facility])     x3   Past Surgical History  Procedure Laterality Date  . Abdominal hysterectomy    . Neck surgery    . Cholecystectomy    . Elbow fracture surgery      right  . Wrist fracture surgery       left, has plate  . Colonoscopy    . Nm myoview ltd  07/02/2013    Normal LV function, EF 83%. (Likely overestimated due to low volume);; no evidence of ischemia or infarction. Normal wall motion.  . Lower extremity arterial dopplers  07/06/2013    RABI 1.0, LABI 1.1.; No evidence of significant vascular atherogenic plaque  . Tonsillectomy    . Knee arthroscopy with excision plica Left 0000000    Procedure: KNEE ARTHROSCOPY WITH EXCISION PLICA;  Surgeon: Marybelle Killings, MD;  Location: [place];  Service: Orthopedics;  Laterality: Left;  . Chondroplasty Left 01/06/2015    Procedure: CHONDROPLASTY;  Surgeon: Marybelle Killings, MD;  Location: [place];  Service: Orthopedics;  Laterality: Left;   Family History  Problem Relation Age of Onset  . Emphysema Father   . Colon cancer Neg Hx   . Diabetes Maternal Grandmother   . Diabetes Maternal Aunt   . Diabetes      mat. cousin  . Heart disease Brother   . Dementia Mother    Social History  Substance Use Topics  . Smoking status: Current Every Day Smoker -- 1.50 packs/day for 21 years    Types: Cigarettes  . Smokeless tobacco: Never Used     Comment:  tobaccco info given 03/06/12, 1.5 PPD 05/15/15  . Alcohol Use: No   OB History    No data available     Review of Systems  Constitutional: Negative for fever and chills.  HENT: Positive for congestion, sneezing and sore throat.   Respiratory: Positive for cough. Negative for chest tightness and shortness of breath.   Cardiovascular: Negative for chest pain, palpitations and leg swelling.  Gastrointestinal: Positive for nausea, vomiting and diarrhea. Negative for abdominal pain.  Genitourinary: Negative for dysuria, flank pain and pelvic pain.  Musculoskeletal: Negative for myalgias, arthralgias, neck pain and neck stiffness.  Skin: Negative for rash.  Neurological: Negative for dizziness, weakness and headaches.  All other systems reviewed and are  negative.     Allergies  Bee venom; Codeine; Ibuprofen; Propoxyphene n-acetaminophen; Toradol; Hydrocodone-acetaminophen; and Latex  Home Medications   Prior to Admission medications   Medication Sig Start Date End Date Taking? Authorizing Provider  ALPRAZolam Duanne Moron) 1 MG tablet Take 1 tablet by mouth daily as needed for anxiety.  08/17/14  Yes Historical Provider, MD  aspirin 81 MG tablet Take 81 mg by mouth daily.   Yes Historical Provider, MD  butalbital-acetaminophen-caffeine (FIORICET, ESGIC) 50-325-40 MG per tablet Take 1 tablet by mouth 2 (two) times daily as needed for headache.   Yes Historical Provider, MD  dexlansoprazole (DEXILANT) 60 MG capsule Take 60 mg by mouth daily.   Yes Historical Provider, MD  diclofenac (VOLTAREN) 75 MG EC tablet Take 1 tablet by mouth twice per day for pain 03/28/15  Yes Historical Provider, MD  meclizine (ANTIVERT) 25 MG tablet Take 1 tablet by mouth daily. 09/30/14  Yes Historical Provider, MD  mometasone-formoterol (DULERA) 200-5 MCG/ACT AERO Inhale 2 puffs into the lungs 2 (two) times daily.   Yes Historical Provider, MD  montelukast (SINGULAIR) 10 MG tablet Take 10 mg by mouth at bedtime.    Yes Historical Provider, MD  nitroGLYCERIN (NITROSTAT) 0.4 MG SL tablet Place 0.4 mg under the tongue every 5 (five) minutes as needed for chest pain.   Yes Historical Provider, MD  oxybutynin (DITROPAN) 5 MG tablet Take 5 mg by mouth 2 (two) times daily.   Yes Historical Provider, MD  PROAIR HFA 108 (90 BASE) MCG/ACT inhaler Inhale 1 mcg into the lungs 2 (two) times daily. 08/19/14  Yes Historical Provider, MD  promethazine (PHENERGAN) 25 MG tablet Take 1 tablet by mouth as needed for nausea.  06/20/14  Yes Historical Provider, MD  simvastatin (ZOCOR) 20 MG tablet TK 1 T PO  QPM BEFORE BEDTIME 05/03/15  Yes Historical Provider, MD  theophylline (THEODUR) 200 MG 12 hr tablet TK 2 TS PO D 02/20/15  Yes Historical Provider, MD  valACYclovir (VALTREX) 500 MG tablet  take 1 tablet twice per day 02/15/15  Yes Historical Provider, MD  cyclobenzaprine (FLEXERIL) 10 MG tablet Take 1 tablet (10 mg total) by mouth 2 (two) times daily as needed for muscle spasms. Patient not taking: Reported on 08/24/2015 04/22/15   Jeannett Senior, PA-C  HYDROcodone-acetaminophen (NORCO) 7.5-325 MG per tablet Take 2 tablets by mouth every 4 (four) hours as needed for moderate pain or severe pain. Patient not taking: Reported on 04/18/2015 01/06/15   Marybelle Killings, MD  ketoconazole (NIZORAL) 200 MG tablet Take 1 tablet (200 mg total) by mouth daily. Patient not taking: Reported on 08/24/2015 01/23/14   Harden Mo, MD  LORazepam (ATIVAN) 1 MG tablet Take 1 tablet (1 mg total) by mouth 3 (three) times daily  as needed (dizziness / nausea). Patient not taking: Reported on 04/19/2015 07/20/14   Carmin Muskrat, MD  meloxicam (MOBIC) 15 MG tablet Take 1 tablet (15 mg total) by mouth daily. Patient not taking: Reported on 05/15/2015 04/22/15   Jeannett Senior, PA-C  oxyCODONE-acetaminophen (PERCOCET/ROXICET) 5-325 MG per tablet Take 1-2 tablets by mouth every 6 (six) hours as needed for severe pain. Patient not taking: Reported on 04/19/2015 04/07/15   Alfonzo Beers, MD   BP 126/104 mmHg  Pulse 95  Temp(Src) 97.9 F (36.6 C) (Oral)  Resp 18  SpO2 98% Physical Exam  Constitutional: She is oriented to person, place, and time. She appears well-developed and well-nourished. No distress.  HENT:  Head: Normocephalic and atraumatic.  Right Ear: Tympanic membrane, external ear and ear canal normal.  Left Ear: Tympanic membrane and external ear normal.  Nose: Mucosal edema and rhinorrhea present.  Mouth/Throat: Uvula is midline, oropharynx is clear and moist and mucous membranes are normal.  Eyes: Conjunctivae are normal.  Neck: Normal range of motion. Neck supple.  Cardiovascular: Normal rate, regular rhythm and normal heart sounds.   Pulmonary/Chest: Effort normal and breath sounds normal. No  respiratory distress. She has no wheezes. She has no rales.  Abdominal: Soft. Bowel sounds are normal. She exhibits no distension. There is no tenderness. There is no rebound.  Musculoskeletal: She exhibits no edema.  Neurological: She is alert and oriented to person, place, and time.  Skin: Skin is warm and dry.  Psychiatric: She has a normal mood and affect. Her behavior is normal.  Nursing note and vitals reviewed.   ED Course  Procedures (including critical care time) Labs Review Labs Reviewed  CBC WITH DIFFERENTIAL/PLATELET - Abnormal; Notable for the following:    Hemoglobin 11.0 (*)    HCT 33.7 (*)    All other components within normal limits  URINALYSIS, ROUTINE W REFLEX MICROSCOPIC (NOT AT [facility]) - Abnormal; Notable for the following:    APPearance CLOUDY (*)    Leukocytes, UA TRACE (*)    All other components within normal limits  URINE MICROSCOPIC-ADD ON - Abnormal; Notable for the following:    Squamous Epithelial / LPF 6-30 (*)    Bacteria, UA RARE (*)    Crystals CA OXALATE CRYSTALS (*)    All other components within normal limits  I-STAT CHEM 8, ED - Abnormal; Notable for the following:    Creatinine, Ser 1.10 (*)    Glucose, Bld 116 (*)    Hemoglobin 11.9 (*)    HCT 35.0 (*)    All other components within normal limits    Imaging Review Dg Chest 2 View  08/24/2015  CLINICAL DATA:  Productive cough and shortness of breath for 3 weeks. COPD. EXAM: CHEST  2 VIEW COMPARISON:  04/19/2015 FINDINGS: The heart size and mediastinal contours are within normal limits. Both lungs are clear. No evidence of pneumothorax or pleural effusion. Cervical spine fusion hardware again noted. IMPRESSION: No active cardiopulmonary disease. Electronically Signed   By: Earle Gell M.D.   On: 08/24/2015 18:40   I have personally reviewed and evaluated these images and lab results as part of my medical decision-making.   EKG Interpretation None      MDM   Final diagnoses:  URI  (upper respiratory infection)     patient emergency department with nasal congestion, sneezing, cough, states several weeks in length. Patient is afebrile. Nontoxic appearing. Vital signs are normal. She does not appear to be acutely ill. She has  mild nasal congestion. Lungs are clear. Chest x-ray is negative. Blood work obtained and is unremarkable. Patient finished a course of antibiotics. I do not think she has a bacterial infection. Most likely viral URI with some GI symptoms. She is tolerating by mouth fluids emergency department. Abdomen is benign. Additional think she needs any further imaging or tests and emergency department. She does have history of COPD and she smokes pack and half cigarettes a day. Instructed to stop smoking. Home with albuterol, prednisone, cough medication. Patient refused a nap treatment in emergency department.  Filed Vitals:   08/24/15 1825 08/24/15 2240  BP: 126/104 115/54  Pulse: 95 85  Temp: 97.9 F (36.6 C) 97.4 F (36.3 C)  TempSrc: Oral Oral  Resp: 18 16  SpO2: 98% 100%     Jeannett Senior, PA-C 08/24/15 [place], DO 08/24/15 2357

## 2015-08-24 NOTE — Discharge Instructions (Signed)
Do not smoke. Inhaler 2 puffs every 4 hrs. Prednisone as prescribed until all gone. Tessalon for cough. Use afrin for up to 3 days only. Use nasal saline for congestion. Follow up with your doctor.    Upper Respiratory Infection, Adult Most upper respiratory infections (URIs) are a viral infection of the air passages leading to the lungs. A URI affects the nose, throat, and upper air passages. The most common type of URI is nasopharyngitis and is typically referred to as "the common cold." URIs run their course and usually go away on their own. Most of the time, a URI does not require medical attention, but sometimes a bacterial infection in the upper airways can follow a viral infection. This is called a secondary infection. Sinus and middle ear infections are common types of secondary upper respiratory infections. Bacterial pneumonia can also complicate a URI. A URI can worsen asthma and chronic obstructive pulmonary disease (COPD). Sometimes, these complications can require emergency medical care and may be life threatening.  CAUSES Almost all URIs are caused by viruses. A virus is a type of germ and can spread from one person to another.  RISKS FACTORS You may be at risk for a URI if:   You smoke.   You have chronic heart or lung disease.  You have a weakened defense (immune) system.   You are very young or very old.   You have nasal allergies or asthma.  You work in crowded or poorly ventilated areas.  You work in health care facilities or schools. SIGNS AND SYMPTOMS  Symptoms typically develop 2-3 days after you come in contact with a cold virus. Most viral URIs last 7-10 days. However, viral URIs from the influenza virus (flu virus) can last 14-18 days and are typically more severe. Symptoms may include:   Runny or stuffy (congested) nose.   Sneezing.   Cough.   Sore throat.   Headache.   Fatigue.   Fever.   Loss of appetite.   Pain in your forehead,  behind your eyes, and over your cheekbones (sinus pain).  Muscle aches.  DIAGNOSIS  Your health care provider may diagnose a URI by:  Physical exam.  Tests to check that your symptoms are not due to another condition such as:  Strep throat.  Sinusitis.  Pneumonia.  Asthma. TREATMENT  A URI goes away on its own with time. It cannot be cured with medicines, but medicines may be prescribed or recommended to relieve symptoms. Medicines may help:  Reduce your fever.  Reduce your cough.  Relieve nasal congestion. HOME CARE INSTRUCTIONS   Take medicines only as directed by your health care provider.   Gargle warm saltwater or take cough drops to comfort your throat as directed by your health care provider.  Use a warm mist humidifier or inhale steam from a shower to increase air moisture. This may make it easier to breathe.  Drink enough fluid to keep your urine clear or pale yellow.   Eat soups and other clear broths and maintain good nutrition.   Rest as needed.   Return to work when your temperature has returned to normal or as your health care provider advises. You may need to stay home longer to avoid infecting others. You can also use a face mask and careful hand washing to prevent spread of the virus.  Increase the usage of your inhaler if you have asthma.   Do not use any tobacco products, including cigarettes, chewing tobacco, or electronic  cigarettes. If you need help quitting, ask your health care provider. PREVENTION  The best way to protect yourself from getting a cold is to practice good hygiene.   Avoid oral or hand contact with people with cold symptoms.   Wash your hands often if contact occurs.  There is no clear evidence that vitamin C, vitamin E, echinacea, or exercise reduces the chance of developing a cold. However, it is always recommended to get plenty of rest, exercise, and practice good nutrition.  SEEK MEDICAL CARE IF:   You are getting  worse rather than better.   Your symptoms are not controlled by medicine.   You have chills.  You have worsening shortness of breath.  You have brown or red mucus.  You have yellow or brown nasal discharge.  You have pain in your face, especially when you bend forward.  You have a fever.  You have swollen neck glands.  You have pain while swallowing.  You have white areas in the back of your throat. SEEK IMMEDIATE MEDICAL CARE IF:   You have severe or persistent:  Headache.  Ear pain.  Sinus pain.  Chest pain.  You have chronic lung disease and any of the following:  Wheezing.  Prolonged cough.  Coughing up blood.  A change in your usual mucus.  You have a stiff neck.  You have changes in your:  Vision.  Hearing.  Thinking.  Mood. MAKE SURE YOU:   Understand these instructions.  Will watch your condition.  Will get help right away if you are not doing well or get worse.   This information is not intended to replace advice given to you by your health care provider. Make sure you discuss any questions you have with your health care provider.   Document Released: 02/26/2001 Document Revised: 01/17/2015 Document Reviewed: 12/08/2013 Elsevier Interactive Patient Education [employer].

## 2015-08-27 IMAGING — CR DG CHEST 2V
2 series · 2 of 2 positions shown · non-contrast
Comparison: 09/05/2012.

CLINICAL DATA: Chest pain

EXAM:
CHEST  2 VIEW

[w chest lat]
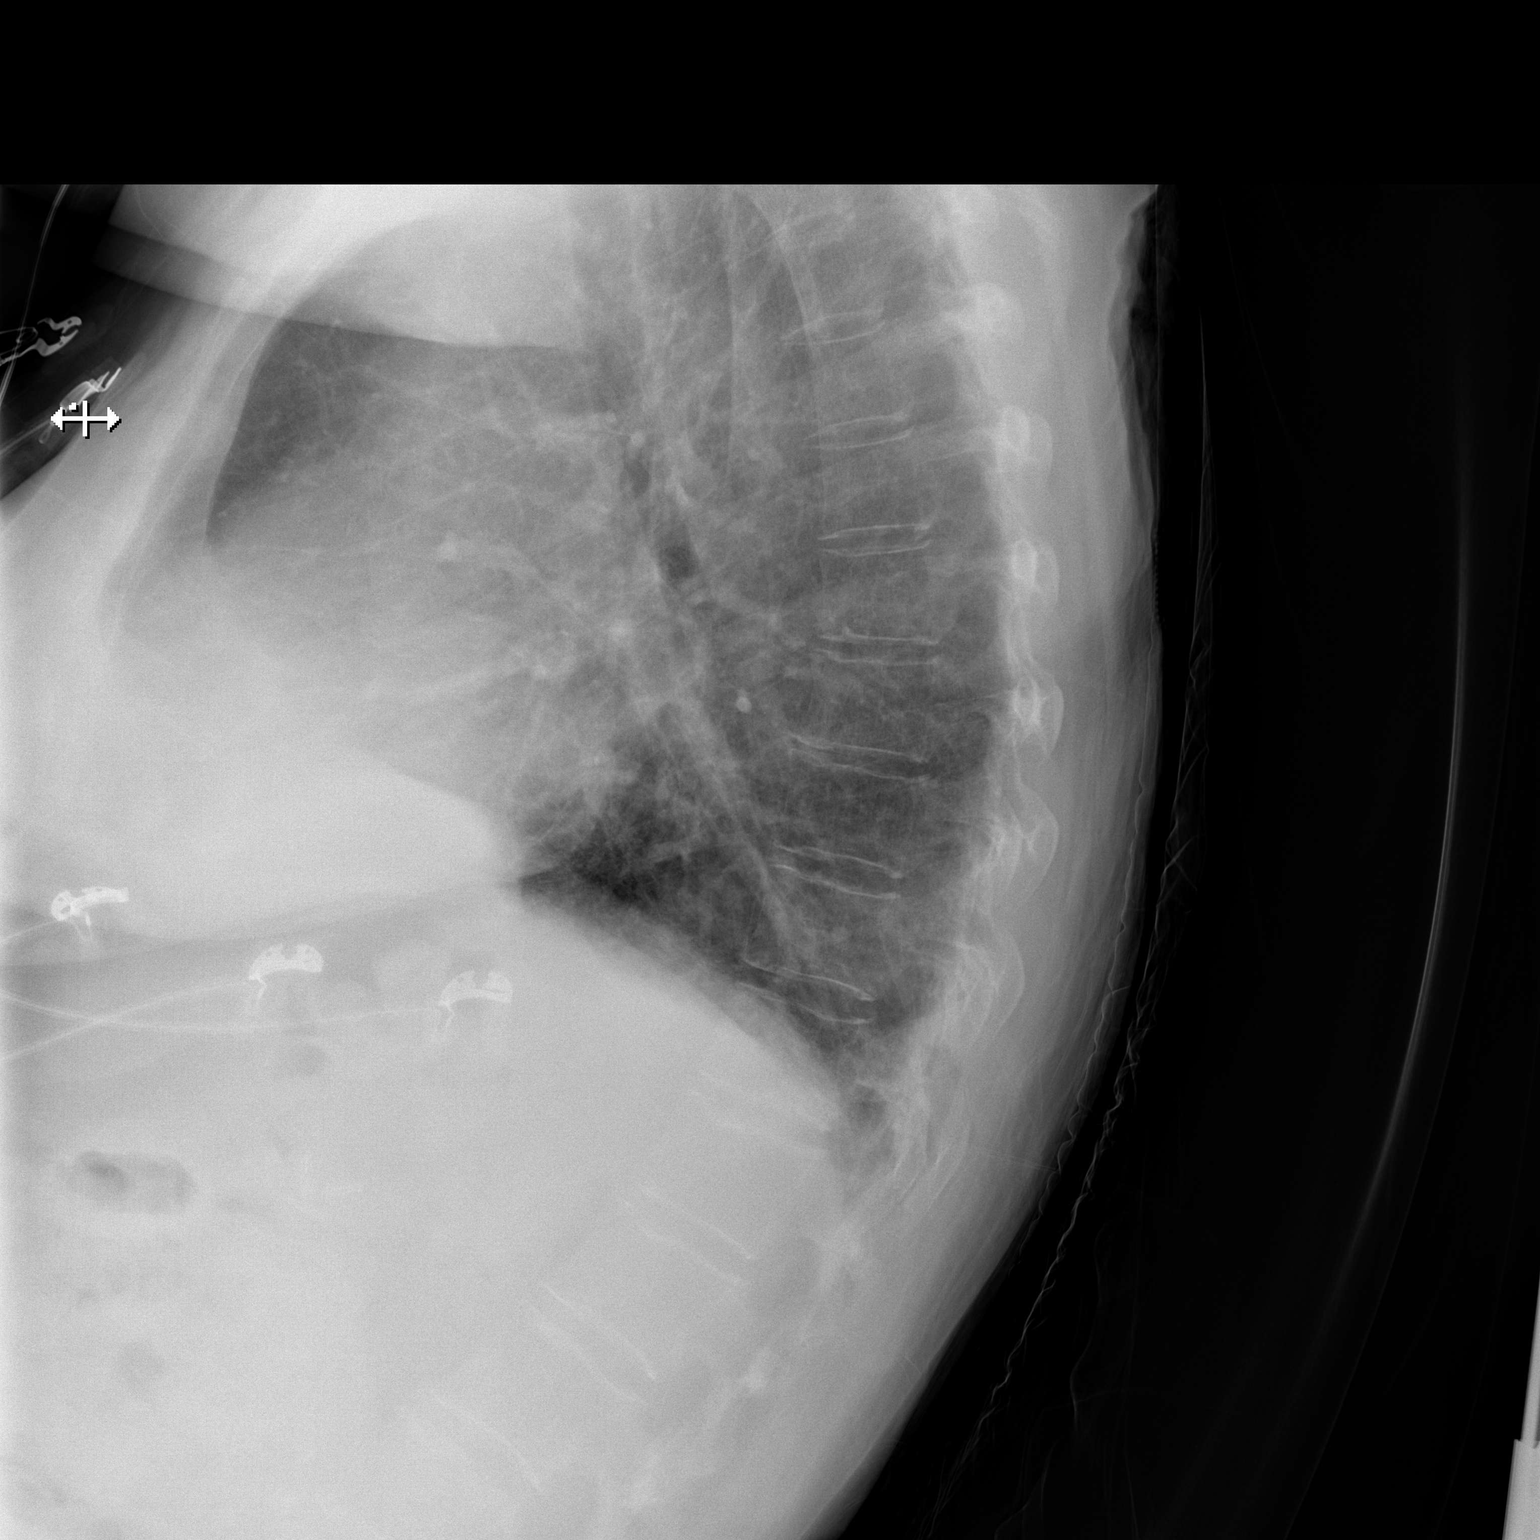

[x chest ap]
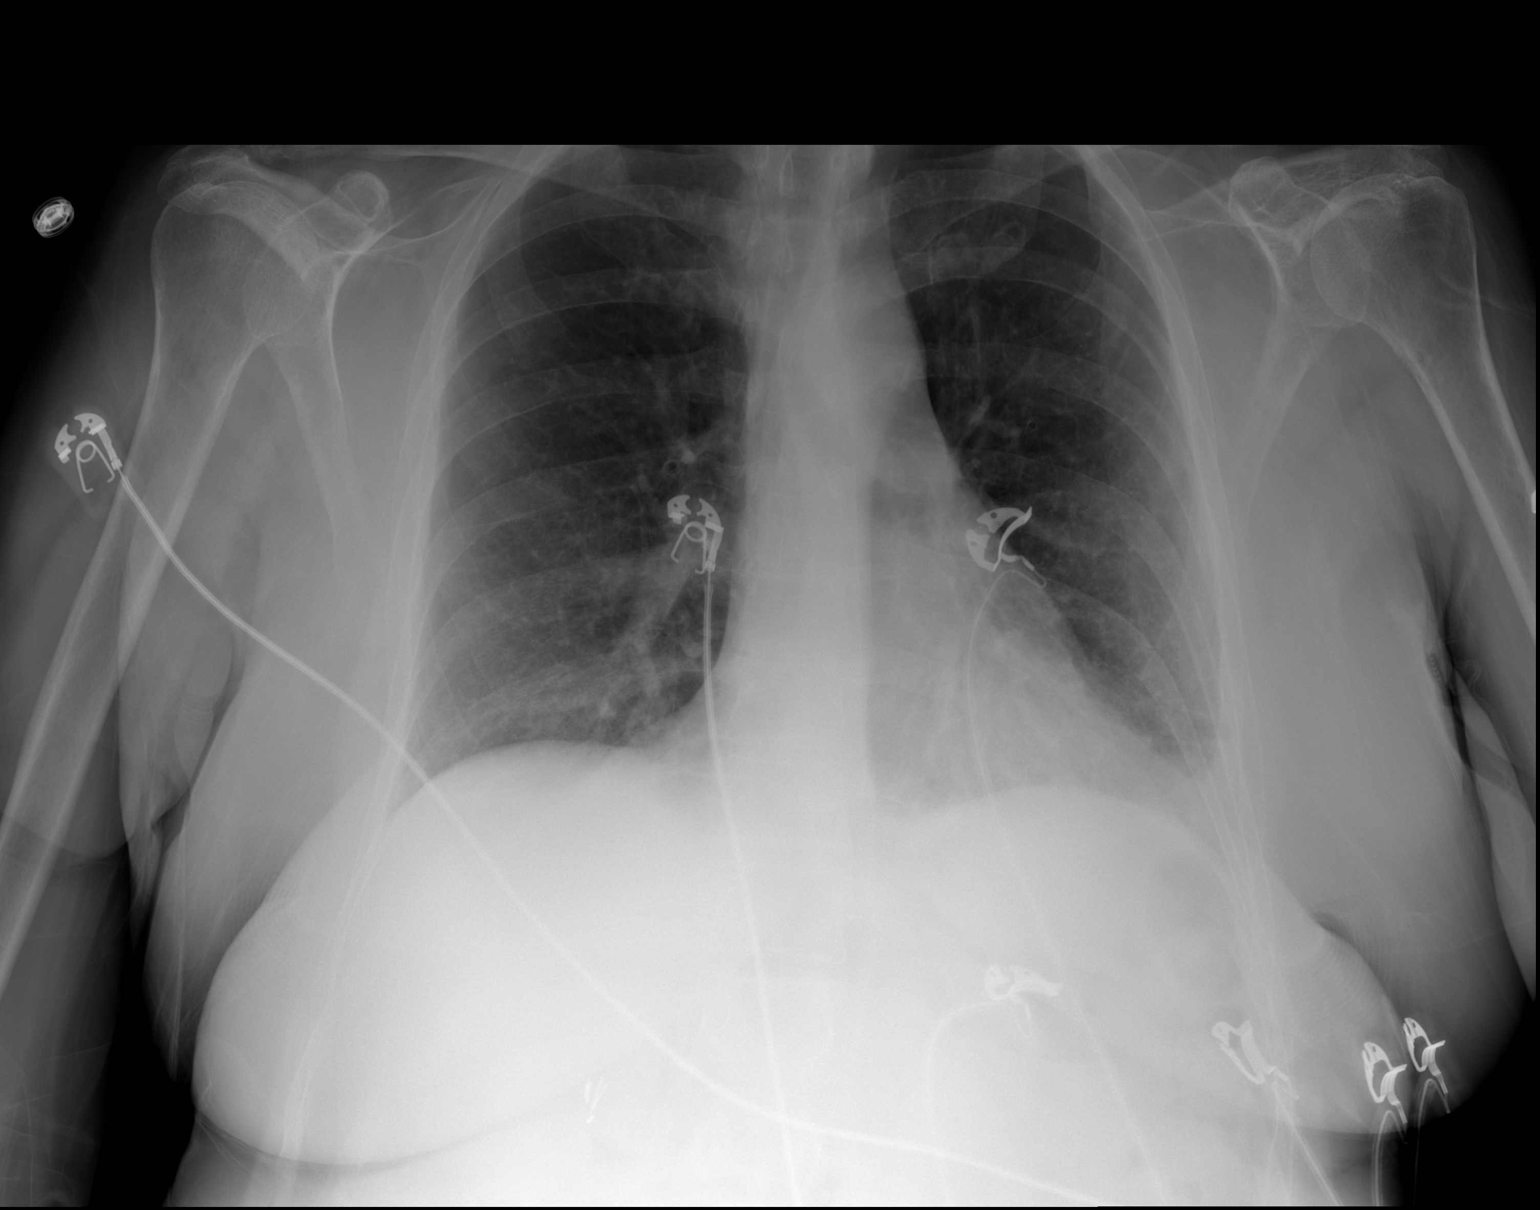

[2 of 2 positions shown; findings below may reference images not displayed]

FINDINGS: The cardiac silhouette, mediastinal and hilar contours are within
normal limits and stable. There are chronic bronchitic and
emphysematous type lung changes but no acute overlying pulmonary
process. No pleural effusion. The bony thorax is intact.
IMPRESSION: Chronic lung changes but no acute pulmonary findings.

## 2015-09-22 ENCOUNTER — Other Ambulatory Visit: Payer: Self-pay | Admitting: Cardiology

## 2015-09-22 MED ORDER — NITROGLYCERIN 0.4 MG SL SUBL: 0.4000 mg | SUBLINGUAL_TABLET | SUBLINGUAL | Status: DC | PRN

## 2015-09-22 NOTE — Telephone Encounter (Signed)
°*  STAT* If patient is at the pharmacy, call can be transferred to refill team.   1. Which medications need to be refilled? (please list name of each medication and dose if known) Nitrostat 0.4mg   2. Which pharmacy/location (including street and city if local pharmacy) is medication to be sent to? [facility] on [address]. And [facility]   3. Do they need a 30 day or 90 day supply? [place]

## 2015-09-22 NOTE — Telephone Encounter (Signed)
NTG refilled electronically 

## 2015-10-03 ENCOUNTER — Ambulatory Visit (INDEPENDENT_AMBULATORY_CARE_PROVIDER_SITE_OTHER): Payer: Medicare Other | Admitting: Cardiology

## 2015-10-03 ENCOUNTER — Encounter: Payer: Self-pay | Admitting: Cardiology

## 2015-10-03 VITALS — BP 130/90 | HR 81 | Ht 64.0 in | Wt 132.4 lb

## 2015-10-03 DIAGNOSIS — R030 Elevated blood-pressure reading, without diagnosis of hypertension: Secondary | ICD-10-CM | POA: Diagnosis not present

## 2015-10-03 DIAGNOSIS — Z720 Tobacco use: Secondary | ICD-10-CM

## 2015-10-03 DIAGNOSIS — R079 Chest pain, unspecified: Secondary | ICD-10-CM | POA: Diagnosis not present

## 2015-10-03 DIAGNOSIS — M940 Chondrocostal junction syndrome [Tietze]: Secondary | ICD-10-CM | POA: Diagnosis not present

## 2015-10-03 DIAGNOSIS — F17200 Nicotine dependence, unspecified, uncomplicated: Secondary | ICD-10-CM

## 2015-10-03 IMAGING — CR DG CHEST 2V
2 series · 2 of 2 positions shown · non-contrast
Comparison: 07/03/2013

CLINICAL DATA: Shortness of breath and weakness.

EXAM:
CHEST - 2 VIEW

[w chest lat]
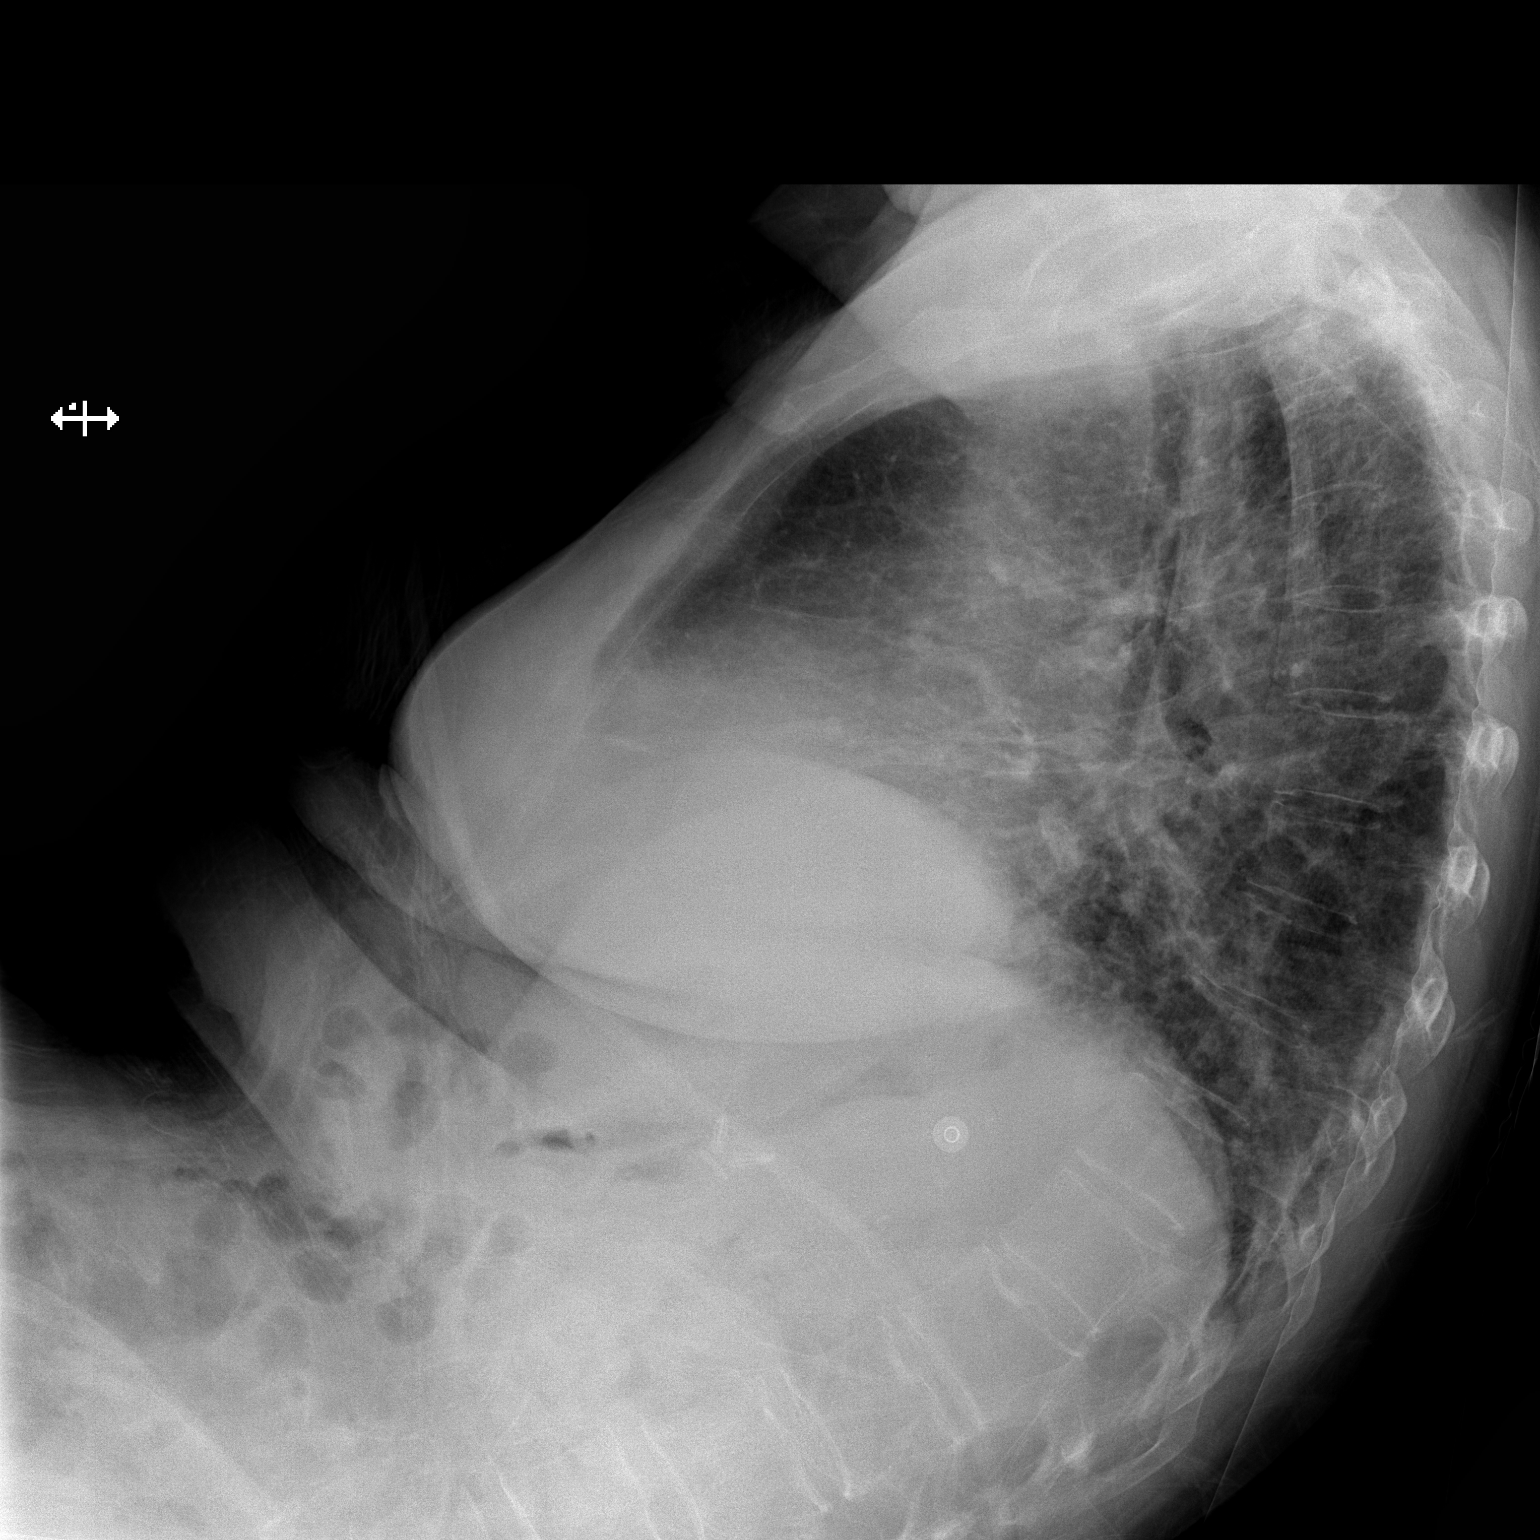

[x chest ap]
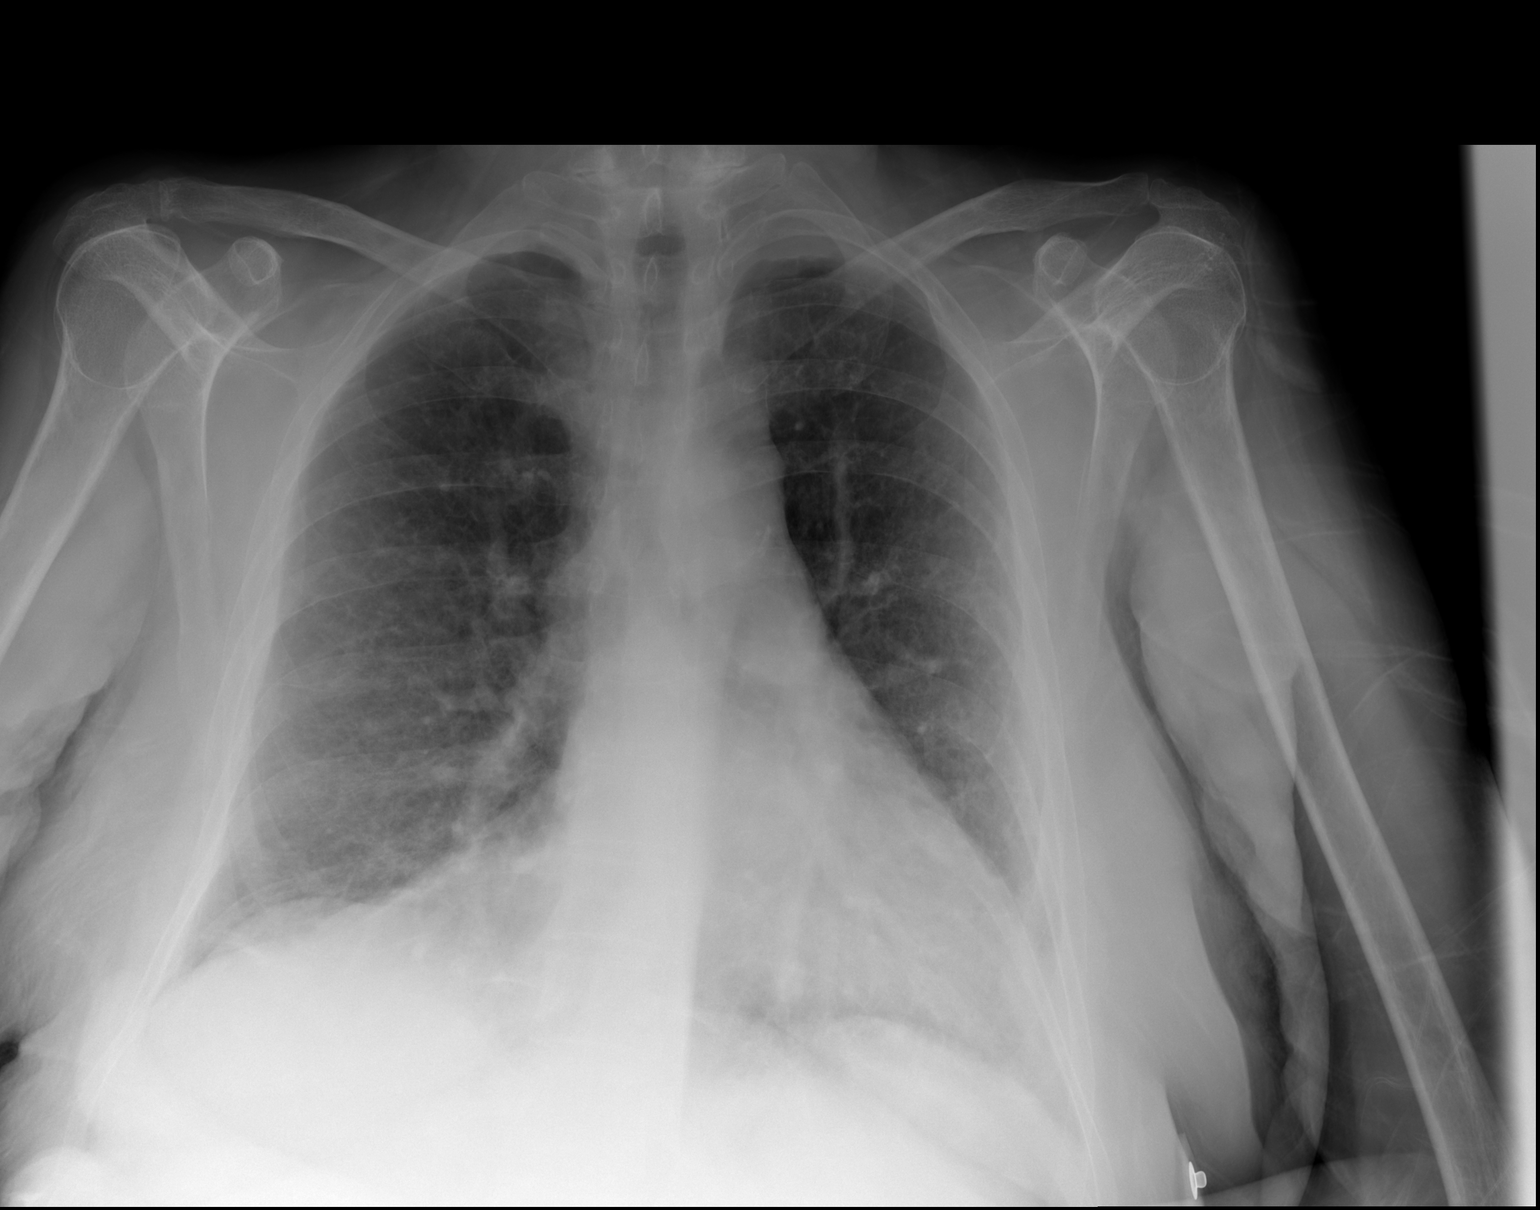

[2 of 2 positions shown; findings below may reference images not displayed]

FINDINGS: Stable chronic lung disease. Bibasilar scarring and atelectasis
present. The heart size is normal. There is no evidence of pulmonary
edema, consolidation, pneumothorax, nodule or pleural fluid.
IMPRESSION: Stable chronic lung disease with bibasilar atelectasis and scarring.

## 2015-10-03 MED ORDER — PREDNISONE 10 MG PO TABS: ORAL_TABLET | ORAL | Status: DC

## 2015-10-03 NOTE — Patient Instructions (Addendum)
Your chest pain is most likely coming from possible arthritis in rib cage.  Prednisone taper dose Take 60 mg (10 mg x 6 ) for 3 days then Take 40 mg (10mg  x 4) for 3 days then Take 20 mg (10 mg x 2 ) for 2 days then Take 10 mg for 2 days then stop This for your chest pain.  Your physician recommends that you schedule a follow-up appointment in as needed basis.  No other changes with current medications.

## 2015-10-03 NOTE — Progress Notes (Signed)
PCP: No primary care provider on file.  Clinic Note: Chief Complaint  Patient presents with  . Annual Exam    sharp pain in left chest, sob,  started this morning.tingling in both hands  . Follow-up    episodes in sept 2016- blacked out-concussion    HPI: Lori [name] is a 60 y.o. female with a PMH below who presents [name] for Cardiology f/u. She had Nuclear ST & Arterial Dopplers back in October 2012 - low risk & normal ABIs.  Difficult historian with tangential speech..  Seen by Kerin Ransom Jan 2016 -> Myoview Non-ischemic with normal function  Lori [name] was last seen in Nov 2014. She has been followed by Neurology for Gait instability -- reported LOC with fall & head trauma in Sept 2016.  Recent Hospitalizations: Several emergency room visits for URI symptoms and headaches.  Studies Reviewed:   Myoview January 2016: LOW RISK. Normal EF of 65% with no regional wall motion abnormalities. No ischemia or infarction.  Interval History: Lori [name] with some significant chest discomfort episodes. She's had it off and on, but began again [name] noting Bilatera /l left greater than right sharp pains in her chest that last maybe 10-15 minutes at a time. Not necessarily associated with any particular activity. Certain movements make it worse, as does deep inspiration. They discomfort is not necessarily associated with shortness of breath., But it has been noted to occur along with some of her dyspnea symptoms. There is no sensation of significant rapid irregular heartbeats or palpitations. She says that this symptom has been on off and on for about 2 months. It occurs appearing much last all day. Bad spells can last several minutes, but the discomfort can persist all day.  She describes the worst spells of being like a spasm sensation that can sometimes go all around the left side of her chest. Back in September timeframe she had an episode where she passed out. Initially  began when she was at the bingo all.  She really can't explain what was happening prior to her loss of consciousness. But apparently once she got home husband did not want to take her to the hospital. She then had another episode which she felt like she may have passed out. Unfortunately she is a very poor historian with very tangential thought and impossible to get a good story. She has noticed some dizziness and occasional palpitations, but nothing prolonged. No sudden onset weakness or numbness to suggest TIA or amaurosis fugax.  She has not had a further episode of syncope. She does have some occasional dizziness but is often orthostatic in nature.   ROS: A comprehensive was performed. Review of Systems  Constitutional: Positive for malaise/fatigue.  HENT: Negative for ear discharge and hearing loss.   Respiratory: Positive for cough and wheezing. Negative for sputum production.   Cardiovascular: Positive for chest pain and palpitations.  Gastrointestinal: Negative for blood in stool and melena.  Genitourinary: Negative for hematuria and flank pain.  Musculoskeletal: Positive for myalgias, joint pain and falls.  Neurological: Negative for weakness and headaches.  Psychiatric/Behavioral: The patient is nervous/anxious.      Past Medical History  Diagnosis Date  . COPD (chronic obstructive pulmonary disease) with chronic bronchitis (HCC)     & Emphysema  . Asthma   . Barrett esophagus 2007  . Hiatal hernia [id number]  . Duodenitis without mention of hemorrhage 2007  . Esophageal stricture   . Esophageal reflux 2007  .  Anxiety   . Depression   . Multiple fractures     from falls, fx rt. elbow, fx left wrist, bilateral ankles  . Allergy   . Ulcer   . H/O hiatal hernia   . Chest pain 05-02-2009    echo  EF 55%  . H/O cardiovascular stress test 05-02-2009    low risk scan, neg. for ischemia, no ekg changes  . Full dentures   . Acute MI [facility])     x3    Past Surgical History    Procedure Laterality Date  . Abdominal hysterectomy    . Neck surgery    . Cholecystectomy    . Elbow fracture surgery      right  . Wrist fracture surgery      left, has plate  . Colonoscopy    . Nm myoview ltd  07/02/2013    Normal LV function, EF 83%. (Likely overestimated due to low volume);; no evidence of ischemia or infarction. Normal wall motion.  . Lower extremity arterial dopplers  07/06/2013    RABI 1.0, LABI 1.1.; No evidence of significant vascular atherogenic plaque  . Tonsillectomy    . Knee arthroscopy with excision plica Left 0000000    Procedure: KNEE ARTHROSCOPY WITH EXCISION PLICA;  Surgeon: Marybelle Killings, MD;  Location: [place];  Service: Orthopedics;  Laterality: Left;  . Chondroplasty Left 01/06/2015    Procedure: CHONDROPLASTY;  Surgeon: Marybelle Killings, MD;  Location: [facility];  Service: Orthopedics;  Laterality: Left;   Prior to Admission medications   Medication Sig Start Date End Date Taking? Authorizing Provider  ALPRAZolam Duanne Moron) 1 MG tablet Take 1 tablet by mouth daily as needed for anxiety.  08/17/14   Historical Provider, MD  aspirin 81 MG tablet Take 81 mg by mouth daily.    Historical Provider, MD  benzonatate (TESSALON) 100 MG capsule Take 1 capsule (100 mg total) by mouth every 8 (eight) hours. 08/24/15   Tatyana Kirichenko, PA-C  butalbital-acetaminophen-caffeine (FIORICET, ESGIC) 50-325-40 MG per tablet Take 1 tablet by mouth 2 (two) times daily as needed for headache.    Historical Provider, MD  cyclobenzaprine (FLEXERIL) 10 MG tablet Take 1 tablet (10 mg total) by mouth 2 (two) times daily as needed for muscle spasms. Patient not taking: Reported on 08/24/2015 04/22/15   Jeannett Senior, PA-C  dexlansoprazole (DEXILANT) 60 MG capsule Take 60 mg by mouth daily.    Historical Provider, MD  diclofenac (VOLTAREN) 75 MG EC tablet Take 1 tablet by mouth twice per day for pain 03/28/15   Historical Provider, MD   HYDROcodone-acetaminophen (NORCO) 7.5-325 MG per tablet Take 2 tablets by mouth every 4 (four) hours as needed for moderate pain or severe pain. Patient not taking: Reported on 04/18/2015 01/06/15   Marybelle Killings, MD  ketoconazole (NIZORAL) 200 MG tablet Take 1 tablet (200 mg total) by mouth daily. Patient not taking: Reported on 08/24/2015 01/23/14   Harden Mo, MD  LORazepam (ATIVAN) 1 MG tablet Take 1 tablet (1 mg total) by mouth 3 (three) times daily as needed (dizziness / nausea). Patient not taking: Reported on 04/19/2015 07/20/14   Carmin Muskrat, MD  meclizine (ANTIVERT) 25 MG tablet Take 1 tablet by mouth daily. 09/30/14   Historical Provider, MD  meloxicam (MOBIC) 15 MG tablet Take 1 tablet (15 mg total) by mouth daily. Patient not taking: Reported on 05/15/2015 04/22/15   Lahoma Rocker Kirichenko, PA-C  mometasone-formoterol (DULERA) 200-5 MCG/ACT AERO Inhale  2 puffs into the lungs 2 (two) times daily.    Historical Provider, MD  montelukast (SINGULAIR) 10 MG tablet Take 10 mg by mouth at bedtime.     Historical Provider, MD  nitroGLYCERIN (NITROSTAT) 0.4 MG SL tablet Place 1 tablet (0.4 mg total) under the tongue every 5 (five) minutes as needed for chest pain. 09/22/15   Leonie Man, MD  oxybutynin (DITROPAN) 5 MG tablet Take 5 mg by mouth 2 (two) times daily.    Historical Provider, MD  oxyCODONE-acetaminophen (PERCOCET/ROXICET) 5-325 MG per tablet Take 1-2 tablets by mouth every 6 (six) hours as needed for severe pain. Patient not taking: Reported on 04/19/2015 04/07/15   Alfonzo Beers, MD  predniSONE (DELTASONE) 20 MG tablet Take 2 tablets (40 mg total) by mouth daily. 08/24/15   Tatyana Kirichenko, PA-C  PROAIR HFA 108 (90 BASE) MCG/ACT inhaler Inhale 1 mcg into the lungs 2 (two) times daily. 08/19/14   Historical Provider, MD  promethazine (PHENERGAN) 25 MG tablet Take 1 tablet by mouth as needed for nausea.  06/20/14   Historical Provider, MD  simvastatin (ZOCOR) 20 MG tablet TK 1 T PO  QPM  BEFORE BEDTIME 05/03/15   Historical Provider, MD  theophylline (THEODUR) 200 MG 12 hr tablet TK 2 TS PO D 02/20/15   Historical Provider, MD  valACYclovir (VALTREX) 500 MG tablet take 1 tablet twice per day 02/15/15   Historical Provider, MD   Allergies  Allergen Reactions  . Bee Venom Swelling  . Codeine Nausea Only    CAUSES ULCERS  . Ibuprofen Nausea And Vomiting  . Propoxyphene N-Acetaminophen Nausea Only  . Toradol [Ketorolac Tromethamine] Nausea And Vomiting  . Hydrocodone-Acetaminophen Nausea Only    REACTION: "sick"  . Latex Rash     Social History   Social History  . Marital Status: Married    Spouse Name: [name]  . Number of Children: 2  . Years of Education: HS   Occupational History  . disable    Social History Main Topics  . Smoking status: Current Every Day Smoker -- 1.50 packs/day for 21 years    Types: Cigarettes  . Smokeless tobacco: Never Used     Comment: tobaccco info given 03/06/12, 1.5 PPD 05/15/15  . Alcohol Use: No  . Drug Use: No  . Sexual Activity: Not Asked   Other Topics Concern  . None   Social History Narrative   Patient lives at home spouse [name].  Has 2 daughters (73 & 33).   Currently "disabled".   Caffeine Use: tea 4 glasses daily.    Smokes ~1 1/2 PPD  05/15/15   Walks everyday - no set routine.   Family History  Problem Relation Age of Onset  . Emphysema Father   . Colon cancer Neg Hx   . Diabetes Maternal Grandmother   . Diabetes Maternal Aunt   . Diabetes      mat. cousin  . Heart disease Brother   . Dementia Mother      Wt Readings from Last 3 Encounters:  10/03/15 132 lb 6.4 oz (60.056 kg)  06/21/15 130 lb (58.968 kg)  05/15/15 130 lb 9.6 oz (59.24 kg)    PHYSICAL EXAM BP 130/90 mmHg  Pulse 81  Ht 5\' 4"  (1.626 m)  Wt 132 lb 6.4 oz (60.056 kg)  BMI 22.72 kg/m2 General appearance: alert, cooperative, appears stated age, no distress  HEENT: [place]/AT, EOMI, MMM, anicteric sclera; she wears glasses, is  edentulous. Neck: no adenopathy,  no carotid bruit and no JVD Lungs: Diffuse mild rhonchorous breath sounds with expiratory wheezing, but non-labored, and clear to percussion. Somewhat clears with coughing. Heart: regular rate and rhythm, S1, S2 normal, no murmur, click, rub or gallop nondisplaced -- [place] costochondral tenderness is still noted.  - Very similar to prior examination Abdomen: soft, non-tender; bowel sounds normal; no masses, no organomegaly; no hepato- jugular reflux Extremities: extremities normal, atraumatic, no cyanosis, and edema none Pulses:1 + DP/PT pulses;  Skin: mobility and turgor normal or telangiectasias - arm(s) bilateral, lower leg(s) bilateral and varicosities - lower leg(s) bilateral, ankle(s) bilateral Neurologic: Mental status: Alert, oriented, thought content appropriate Cranial nerves: normal (II-XII grossly intact)    Adult ECG Report  Rate: 81 ;  Rhythm: normal sinus rhythm and Borderline low voltage. Otherwise normal axis and intervals.;   Narrative Interpretation: Normal EKG   Other studies Reviewed: Additional studies/ records that were reviewed [name] include:  Recent Labs:   Lab Results  Component Value Date   CREATININE 1.10* 08/24/2015   No results found for: CHOL, HDL, LDLCALC, LDLDIRECT, TRIG, CHOLHDL    ASSESSMENT / PLAN: Unfortunately, she presents several months following a possible syncopal episode.  Very unlikely that we will figure out what the etiology was. No further symptoms. Her chest discomfort is probably costochondral in nature.   Problem List Items Addressed This Visit    Smoking (Chronic)   Relevant Orders   EKG 12-Lead (Completed)   Costochondritis - Primary    She again has reproducible costochondral tenderness very similar to back in 2014. In the past. Tried some and saved them,, but she is trying to avoid those medications. I actually think since she been having some coughing and congestion issues we will simply  use it prednisone taper.      Relevant Medications   predniSONE (DELTASONE) 10 MG tablet   Other Relevant Orders   EKG 12-Lead (Completed)   Chest pain with low risk of acute coronary syndrome (Chronic)    Again I think her chest pain is anginal in nature. Clinically does not sound like angina and is reproducible on exam. We'll treat costochondritis flare up with steroid taper.      Chest pain   Relevant Orders   EKG 12-Lead (Completed)   Borderline hypertension   Relevant Orders   EKG 12-Lead (Completed)      Current medicines are reviewed at length with the patient [name]. (+/- concerns) none The following changes have been made:    Your chest pain is most likely coming from possible arthritis in rib cage.  Prednisone taper dose Take 60 mg (10 mg x 6 ) for 3 days then Take 40 mg (10mg  x 4) for 3 days then Take 20 mg (10 mg x 2 ) for 2 days then Take 10 mg for 2 days then stop This for your chest pain.  Your physician recommends that you schedule a follow-up appointment in as needed basis.  Studies Ordered:   Orders Placed This Encounter  Procedures  . EKG 12-Lead      Leonie Man, M.D., M.S. Interventional Cardiologist   Pager # [phone]

## 2015-10-04 NOTE — Assessment & Plan Note (Signed)
She again has reproducible costochondral tenderness very similar to back in 2014. In the past. Tried some and saved them,, but she is trying to avoid those medications. I actually think since she been having some coughing and congestion issues we will simply use it prednisone taper.

## 2015-10-04 NOTE — Assessment & Plan Note (Signed)
Again I think her chest pain is anginal in nature. Clinically does not sound like angina and is reproducible on exam. We'll treat costochondritis flare up with steroid taper.

## 2015-10-15 ENCOUNTER — Emergency Department (HOSPITAL_COMMUNITY)
Admission: EM | Admit: 2015-10-15 | Discharge: 2015-10-15 | Disposition: A | Discharge status: Home / Self Care | Payer: Medicare Other | Attending: Emergency Medicine | Admitting: Emergency Medicine

## 2015-10-15 ENCOUNTER — Emergency Department (HOSPITAL_COMMUNITY): Payer: Medicare Other

## 2015-10-15 ENCOUNTER — Encounter (HOSPITAL_COMMUNITY): Payer: Self-pay | Admitting: Emergency Medicine

## 2015-10-15 DIAGNOSIS — Z8719 Personal history of other diseases of the digestive system: Secondary | ICD-10-CM | POA: Diagnosis not present

## 2015-10-15 DIAGNOSIS — Z98811 Dental restoration status: Secondary | ICD-10-CM | POA: Insufficient documentation

## 2015-10-15 DIAGNOSIS — K529 Noninfective gastroenteritis and colitis, unspecified: Secondary | ICD-10-CM | POA: Insufficient documentation

## 2015-10-15 DIAGNOSIS — Z79899 Other long term (current) drug therapy: Secondary | ICD-10-CM | POA: Diagnosis not present

## 2015-10-15 DIAGNOSIS — F17210 Nicotine dependence, cigarettes, uncomplicated: Secondary | ICD-10-CM | POA: Insufficient documentation

## 2015-10-15 DIAGNOSIS — F329 Major depressive disorder, single episode, unspecified: Secondary | ICD-10-CM | POA: Insufficient documentation

## 2015-10-15 DIAGNOSIS — Z7982 Long term (current) use of aspirin: Secondary | ICD-10-CM | POA: Diagnosis not present

## 2015-10-15 DIAGNOSIS — Z8781 Personal history of (healed) traumatic fracture: Secondary | ICD-10-CM | POA: Diagnosis not present

## 2015-10-15 DIAGNOSIS — R1110 Vomiting, unspecified: Secondary | ICD-10-CM | POA: Diagnosis present

## 2015-10-15 DIAGNOSIS — J439 Emphysema, unspecified: Secondary | ICD-10-CM | POA: Diagnosis not present

## 2015-10-15 DIAGNOSIS — Z91040 Latex allergy status: Secondary | ICD-10-CM | POA: Diagnosis not present

## 2015-10-15 DIAGNOSIS — Z7951 Long term (current) use of inhaled steroids: Secondary | ICD-10-CM | POA: Insufficient documentation

## 2015-10-15 DIAGNOSIS — J449 Chronic obstructive pulmonary disease, unspecified: Secondary | ICD-10-CM

## 2015-10-15 DIAGNOSIS — I252 Old myocardial infarction: Secondary | ICD-10-CM | POA: Diagnosis not present

## 2015-10-15 DIAGNOSIS — F419 Anxiety disorder, unspecified: Secondary | ICD-10-CM | POA: Diagnosis not present

## 2015-10-15 LAB — COMPREHENSIVE METABOLIC PANEL
ALT: 11 U/L — ABNORMAL LOW (ref 14–54)
AST: 19 U/L (ref 15–41)
Albumin: 4.1 g/dL (ref 3.5–5.0)
Alkaline Phosphatase: 80 U/L (ref 38–126)
Anion gap: 13 (ref 5–15)
BUN: 9 mg/dL (ref 6–20)
CHLORIDE: 104 mmol/L (ref 101–111)
CO2: 19 mmol/L — AB (ref 22–32)
Calcium: 8.7 mg/dL — ABNORMAL LOW (ref 8.9–10.3)
Creatinine, Ser: 0.86 mg/dL (ref 0.44–1.00)
Glucose, Bld: 105 mg/dL — ABNORMAL HIGH (ref 65–99)
POTASSIUM: 3.3 mmol/L — AB (ref 3.5–5.1)
SODIUM: 136 mmol/L (ref 135–145)
Total Bilirubin: 0.4 mg/dL (ref 0.3–1.2)
Total Protein: 6.9 g/dL (ref 6.5–8.1)

## 2015-10-15 LAB — URINALYSIS, ROUTINE W REFLEX MICROSCOPIC
Bilirubin Urine: NEGATIVE
GLUCOSE, UA: NEGATIVE mg/dL
Ketones, ur: NEGATIVE mg/dL
LEUKOCYTES UA: NEGATIVE
Nitrite: NEGATIVE
PH: 6.5 (ref 5.0–8.0)
PROTEIN: NEGATIVE mg/dL
SPECIFIC GRAVITY, URINE: 1.006 (ref 1.005–1.030)

## 2015-10-15 LAB — URINE MICROSCOPIC-ADD ON

## 2015-10-15 LAB — CBC
HEMATOCRIT: 35.3 % — AB (ref 36.0–46.0)
HEMOGLOBIN: 11.4 g/dL — AB (ref 12.0–15.0)
MCH: 27.2 pg (ref 26.0–34.0)
MCHC: 32.3 g/dL (ref 30.0–36.0)
MCV: 84.2 fL (ref 78.0–100.0)
PLATELETS: 362 10*3/uL (ref 150–400)
RBC: 4.19 MIL/uL (ref 3.87–5.11)
RDW: 16.2 % — ABNORMAL HIGH (ref 11.5–15.5)
WBC: 21.8 10*3/uL — AB (ref 4.0–10.5)

## 2015-10-15 LAB — LIPASE, BLOOD: LIPASE: 24 U/L (ref 11–51)

## 2015-10-15 LAB — CBG MONITORING, ED: GLUCOSE-CAPILLARY: 103 mg/dL — AB (ref 65–99)

## 2015-10-15 MED ORDER — ONDANSETRON HCL 4 MG PO TABS: 4.0000 mg | ORAL_TABLET | Freq: Four times a day (QID) | ORAL | Status: DC

## 2015-10-15 MED ORDER — ACETAMINOPHEN 500 MG PO TABS
1000.0000 mg | ORAL_TABLET | Freq: Once | ORAL | Status: AC
  Administered 2015-10-15: 1000 mg via ORAL
  Filled 2015-10-15: qty 2

## 2015-10-15 MED ORDER — IPRATROPIUM-ALBUTEROL 0.5-2.5 (3) MG/3ML IN SOLN
3.0000 mL | Freq: Once | RESPIRATORY_TRACT | Status: AC
  Administered 2015-10-15: 3 mL via RESPIRATORY_TRACT
  Filled 2015-10-15: qty 3

## 2015-10-15 MED ORDER — ONDANSETRON HCL 4 MG/2ML IJ SOLN
4.0000 mg | Freq: Once | INTRAMUSCULAR | Status: AC | PRN
  Administered 2015-10-15: 4 mg via INTRAVENOUS
  Filled 2015-10-15: qty 2

## 2015-10-15 MED ORDER — SODIUM CHLORIDE 0.9 % IV SOLN
1000.0000 mL | INTRAVENOUS | Status: DC
  Administered 2015-10-15: 1000 mL via INTRAVENOUS

## 2015-10-15 MED ORDER — SODIUM CHLORIDE 0.9 % IV SOLN
1000.0000 mL | Freq: Once | INTRAVENOUS | Status: AC
  Administered 2015-10-15: 1000 mL via INTRAVENOUS

## 2015-10-15 MED ORDER — LOPERAMIDE HCL 2 MG PO CAPS: 2.0000 mg | ORAL_CAPSULE | Freq: Four times a day (QID) | ORAL | Status: DC | PRN

## 2015-10-15 MED ORDER — SODIUM CHLORIDE 0.9 % IV SOLN
INTRAVENOUS | Status: DC
  Administered 2015-10-15: 08:00:00 via INTRAVENOUS

## 2015-10-15 NOTE — ED Notes (Signed)
Brought in by EMS from home with c/o emesis.  Pt reports that she has been vomiting "since one o'clock this morning".  Pt denies abdominal pain.

## 2015-10-15 NOTE — Discharge Instructions (Signed)
Viral Gastroenteritis Viral gastroenteritis is also known as stomach flu. This condition affects the stomach and intestinal tract. It can cause sudden diarrhea and vomiting. The illness typically lasts 3 to 8 days. Most people develop an immune response that eventually gets rid of the virus. While this natural response develops, the virus can make you quite ill. CAUSES  Many different viruses can cause gastroenteritis, such as rotavirus or noroviruses. You can catch one of these viruses by consuming contaminated food or water. You may also catch a virus by sharing utensils or other personal items with an infected person or by touching a contaminated surface. SYMPTOMS  The most common symptoms are diarrhea and vomiting. These problems can cause a severe loss of body fluids (dehydration) and a body salt (electrolyte) imbalance. Other symptoms may include:  Fever.  Headache.  Fatigue.  Abdominal pain. DIAGNOSIS  Your caregiver can usually diagnose viral gastroenteritis based on your symptoms and a physical exam. A stool sample may also be taken to test for the presence of viruses or other infections. TREATMENT  This illness typically goes away on its own. Treatments are aimed at rehydration. The most serious cases of viral gastroenteritis involve vomiting so severely that you are not able to keep fluids down. In these cases, fluids must be given through an intravenous line (IV). HOME CARE INSTRUCTIONS   Drink enough fluids to keep your urine clear or pale yellow. Drink small amounts of fluids frequently and increase the amounts as tolerated.  Ask your caregiver for specific rehydration instructions.  Avoid:  Foods high in sugar.  Alcohol.  Carbonated drinks.  Tobacco.  Juice.  Caffeine drinks.  Extremely hot or cold fluids.  Fatty, greasy foods.  Too much intake of anything at one time.  Dairy products until 24 to 48 hours after diarrhea stops.  You may consume probiotics.  Probiotics are active cultures of beneficial bacteria. They may lessen the amount and number of diarrheal stools in adults. Probiotics can be found in yogurt with active cultures and in supplements.  Wash your hands well to avoid spreading the virus.  Only take over-the-counter or prescription medicines for pain, discomfort, or fever as directed by your caregiver. Do not give aspirin to children. Antidiarrheal medicines are not recommended.  Ask your caregiver if you should continue to take your regular prescribed and over-the-counter medicines.  Keep all follow-up appointments as directed by your caregiver. SEEK IMMEDIATE MEDICAL CARE IF:   You are unable to keep fluids down.  You do not urinate at least once every 6 to 8 hours.  You develop shortness of breath.  You notice blood in your stool or vomit. This may look like coffee grounds.  You have abdominal pain that increases or is concentrated in one small area (localized).  You have persistent vomiting or diarrhea.  You have a fever.  The patient is a child younger than 3 months, and he or she has a fever.  The patient is a child older than 3 months, and he or she has a fever and persistent symptoms.  The patient is a child older than 3 months, and he or she has a fever and symptoms suddenly get worse.  The patient is a baby, and he or she has no tears when crying. MAKE SURE YOU:   Understand these instructions.  Will watch your condition.  Will get help right away if you are not doing well or get worse.   This information is not intended to replace  advice given to you by your health care provider. Make sure you discuss any questions you have with your health care provider.   Document Released: 09/02/2005 Document Revised: 11/25/2011 Document Reviewed: 06/19/2011 Elsevier Interactive Patient Education 2016 Elsevier Inc[employer] Chronic Obstructive Pulmonary Disease Chronic obstructive pulmonary disease (COPD) is a common  lung condition in which airflow from the lungs is limited. COPD is a general term that can be used to describe many different lung problems that limit airflow, including both chronic bronchitis and emphysema. If you have COPD, your lung function will probably never return to normal, but there are measures you can take to improve lung function and make yourself feel better. CAUSES   Smoking (common).  Exposure to secondhand smoke.  Genetic problems.  Chronic inflammatory lung diseases or recurrent infections. SYMPTOMS  Shortness of breath, especially with physical activity.  Deep, persistent (chronic) cough with a large amount of thick mucus.  Wheezing.  Rapid breaths (tachypnea).  Gray or bluish discoloration (cyanosis) of the skin, especially in your fingers, toes, or lips.  Fatigue.  Weight loss.  Frequent infections or episodes when breathing symptoms become much worse (exacerbations).  Chest tightness. DIAGNOSIS Your health care provider will take a medical history and perform a physical examination to diagnose COPD. Additional tests for COPD may include:  Lung (pulmonary) function tests.  Chest X-ray.  CT scan.  Blood tests. TREATMENT  Treatment for COPD may include:  Inhaler and nebulizer medicines. These help manage the symptoms of COPD and make your breathing more comfortable.  Supplemental oxygen. Supplemental oxygen is only helpful if you have a low oxygen level in your blood.  Exercise and physical activity. These are beneficial for nearly all people with COPD.  Lung surgery or transplant.  Nutrition therapy to gain weight, if you are underweight.  Pulmonary rehabilitation. This may involve working with a team of health care providers and specialists, such as respiratory, occupational, and physical therapists. HOME CARE INSTRUCTIONS  Take all medicines (inhaled or pills) as directed by your health care provider.  Avoid over-the-counter medicines or  cough syrups that dry up your airway (such as antihistamines) and slow down the elimination of secretions unless instructed otherwise by your health care provider.  If you are a smoker, the most important thing that you can do is stop smoking. Continuing to smoke will cause further lung damage and breathing trouble. Ask your health care provider for help with quitting smoking. He or she can direct you to community resources or hospitals that provide support.  Avoid exposure to irritants such as smoke, chemicals, and fumes that aggravate your breathing.  Use oxygen therapy and pulmonary rehabilitation if directed by your health care provider. If you require home oxygen therapy, ask your health care provider whether you should purchase a pulse oximeter to measure your oxygen level at home.  Avoid contact with individuals who have a contagious illness.  Avoid extreme temperature and humidity changes.  Eat healthy foods. Eating smaller, more frequent meals and resting before meals may help you maintain your strength.  Stay active, but balance activity with periods of rest. Exercise and physical activity will help you maintain your ability to do things you want to do.  Preventing infection and hospitalization is very important when you have COPD. Make sure to receive all the vaccines your health care provider recommends, especially the pneumococcal and influenza vaccines. Ask your health care provider whether you need a pneumonia vaccine.  Learn and use relaxation techniques to manage stress.  Learn and use controlled breathing techniques as directed by your health care provider. Controlled breathing techniques include:  Pursed lip breathing. Start by breathing in (inhaling) through your nose for 1 second. Then, purse your lips as if you were going to whistle and breathe out (exhale) through the pursed lips for 2 seconds.  Diaphragmatic breathing. Start by putting one hand on your abdomen just  above your waist. Inhale slowly through your nose. The hand on your abdomen should move out. Then purse your lips and exhale slowly. You should be able to feel the hand on your abdomen moving in as you exhale.  Learn and use controlled coughing to clear mucus from your lungs. Controlled coughing is a series of short, progressive coughs. The steps of controlled coughing are: 1. Lean your head slightly forward. 2. Breathe in deeply using diaphragmatic breathing. 3. Try to hold your breath for 3 seconds. 4. Keep your mouth slightly open while coughing twice. 5. Spit any mucus out into a tissue. 6. Rest and repeat the steps once or twice as needed. SEEK MEDICAL CARE IF:  You are coughing up more mucus than usual.  There is a change in the color or thickness of your mucus.  Your breathing is more labored than usual.  Your breathing is faster than usual. SEEK IMMEDIATE MEDICAL CARE IF:  You have shortness of breath while you are resting.  You have shortness of breath that prevents you from:  Being able to talk.  Performing your usual physical activities.  You have chest pain lasting longer than 5 minutes.  Your skin color is more cyanotic than usual.  You measure low oxygen saturations for longer than 5 minutes with a pulse oximeter. MAKE SURE YOU:  Understand these instructions.  Will watch your condition.  Will get help right away if you are not doing well or get worse.   This information is not intended to replace advice given to you by your health care provider. Make sure you discuss any questions you have with your health care provider.   Document Released: 06/12/2005 Document Revised: 09/23/2014 Document Reviewed: 04/29/2013 Elsevier Interactive Patient Education [employer].

## 2015-10-15 NOTE — ED Notes (Signed)
Patient transported to X-ray 

## 2015-10-15 NOTE — ED Notes (Signed)
Pt attempted to sign but esignature pad is not working

## 2015-10-15 NOTE — ED Notes (Signed)
Dr Vallery Ridge notified that pt now c/o head and chest pain

## 2015-10-15 NOTE — ED Provider Notes (Signed)
CSN: [id number]     Arrival date & time 10/15/15  [id number] History   First MD Initiated Contact with Patient 10/15/15 [phone]     Chief Complaint  Patient presents with  . Emesis     (Consider location/radiation/quality/duration/timing/severity/associated sxs/prior Treatment) HPI Patient reports multiple episodes of vomiting and diarrhea starting at 1 AM. She reports with every episode of emesis, she had associated loose stool. She reports cramping abdominal pain prior to episodes of vomiting or diarrhea. This is been waxing and waning. She reports chills but no known fever. No blood in the stool or emesis. She reports she's had a cough for 2 weeks. She states she has COPD and always has cough and shortness of breath. It has been worse over the past 2 weeks with increased productive cough of yellow sputum. She reports uses inhalers at home. She denies any recent treatment for this. She continues to smoke. She is not on home O2. She reports generalized headache since onset of vomiting. Throbbing in quality. No associated visual changes, focal weakness or confusion. Past Medical History  Diagnosis Date  . COPD (chronic obstructive pulmonary disease) with chronic bronchitis (HCC)     & Emphysema  . Asthma   . Barrett esophagus 2007  . Hiatal hernia [id number]  . Duodenitis without mention of hemorrhage 2007  . Esophageal stricture   . Esophageal reflux 2007  . Anxiety   . Depression   . Multiple fractures     from falls, fx rt. elbow, fx left wrist, bilateral ankles  . Allergy   . Ulcer   . H/O hiatal hernia   . Chest pain 05-02-2009    echo  EF 55%  . H/O cardiovascular stress test 05-02-2009    low risk scan, neg. for ischemia, no ekg changes  . Full dentures   . Acute MI [facility])     x3   Past Surgical History  Procedure Laterality Date  . Abdominal hysterectomy    . Neck surgery    . Cholecystectomy    . Elbow fracture surgery      right  . Wrist fracture surgery      left, has plate   . Colonoscopy    . Nm myoview ltd  07/02/2013    Normal LV function, EF 83%. (Likely overestimated due to low volume);; no evidence of ischemia or infarction. Normal wall motion.  . Lower extremity arterial dopplers  07/06/2013    RABI 1.0, LABI 1.1.; No evidence of significant vascular atherogenic plaque  . Tonsillectomy    . Knee arthroscopy with excision plica Left 0000000    Procedure: KNEE ARTHROSCOPY WITH EXCISION PLICA;  Surgeon: Marybelle Killings, MD;  Location: [place];  Service: Orthopedics;  Laterality: Left;  . Chondroplasty Left 01/06/2015    Procedure: CHONDROPLASTY;  Surgeon: Marybelle Killings, MD;  Location: [place];  Service: Orthopedics;  Laterality: Left;   Family History  Problem Relation Age of Onset  . Emphysema Father   . Colon cancer Neg Hx   . Diabetes Maternal Grandmother   . Diabetes Maternal Aunt   . Diabetes      mat. cousin  . Heart disease Brother   . Dementia Mother    Social History  Substance Use Topics  . Smoking status: Current Every Day Smoker -- 1.50 packs/day for 21 years    Types: Cigarettes  . Smokeless tobacco: Never Used     Comment: tobaccco info given 03/06/12, 1.5 PPD 05/15/15  .  Alcohol Use: No   OB History    No data available     Review of Systems 10 Systems reviewed and are negative for acute change except as noted in the HPI.    Allergies  Bee venom; Codeine; Ibuprofen; Propoxyphene n-acetaminophen; Toradol; Hydrocodone-acetaminophen; and Latex  Home Medications   Prior to Admission medications   Medication Sig Start Date End Date Taking? Authorizing Provider  ALPRAZolam Duanne Moron) 1 MG tablet Take 1 tablet by mouth daily as needed for anxiety.  08/17/14  Yes Historical Provider, MD  aspirin 81 MG chewable tablet Chew 81 mg by mouth every morning.   Yes Historical Provider, MD  DEXILANT 60 MG capsule TAKE 1 CAPSULE BY MOUTH ONCE DAILY 10/10/15  Yes Historical Provider, MD  Fluticasone  Furoate-Vilanterol (BREO ELLIPTA) 100-25 MCG/INH AEPB Inhale into the lungs 2 (two) times daily.   Yes Historical Provider, MD  meclizine (ANTIVERT) 25 MG tablet Take 1 tablet by mouth 3 (three) times daily.  09/30/14  Yes Historical Provider, MD  mometasone-formoterol (DULERA) 200-5 MCG/ACT AERO Inhale 2 puffs into the lungs 2 (two) times daily.   Yes Historical Provider, MD  montelukast (SINGULAIR) 10 MG tablet Take 10 mg by mouth at bedtime.    Yes Historical Provider, MD  nitroGLYCERIN (NITROSTAT) 0.4 MG SL tablet Place 1 tablet (0.4 mg total) under the tongue every 5 (five) minutes as needed for chest pain. 09/22/15  Yes Leonie Man, MD  promethazine (PHENERGAN) 25 MG tablet Take 1 tablet by mouth 2 (two) times daily as needed for nausea.  06/20/14  Yes Historical Provider, MD  simvastatin (ZOCOR) 20 MG tablet TK 1 T PO  QPM BEFORE BEDTIME 05/03/15  Yes Historical Provider, MD  tiZANidine (ZANAFLEX) 4 MG tablet Take 4 mg by mouth 2 (two) times daily as needed for muscle spasms.   Yes Historical Provider, MD  valACYclovir (VALTREX) 500 MG tablet take 1 tablet twice per day 02/15/15  Yes Historical Provider, MD  cyclobenzaprine (FLEXERIL) 10 MG tablet Take 1 tablet (10 mg total) by mouth 2 (two) times daily as needed for muscle spasms. Patient not taking: Reported on 10/15/2015 04/22/15   Jeannett Senior, PA-C  loperamide (IMODIUM) 2 MG capsule Take 1 capsule (2 mg total) by mouth 4 (four) times daily as needed for diarrhea or loose stools. 10/15/15   Charlesetta Shanks, MD  ondansetron (ZOFRAN) 4 MG tablet Take 1 tablet (4 mg total) by mouth every 6 (six) hours. 10/15/15   Charlesetta Shanks, MD  predniSONE (DELTASONE) 10 MG tablet Take 60 mg for 3 days then take 40 mg for 3 days then 20 mg for 2 days then 10 mg for 2 days then stop Patient not taking: Reported on 10/15/2015 10/03/15   Leonie Man, MD  traMADol (ULTRAM) 50 MG tablet TAKE 1 TABLET BY MOUTH TWICE DAILY PRN FOR PAIN 10/13/15   Historical  Provider, MD   BP 100/60 mmHg  Pulse 70  Temp(Src) 98.3 F (36.8 C) (Oral)  Resp 16  SpO2 99% Physical Exam  Constitutional: She is oriented to person, place, and time. She appears well-developed and well-nourished.  HENT:  Head: Normocephalic and atraumatic.  Eyes: EOM are normal. Pupils are equal, round, and reactive to light.  Neck: Neck supple.  Cardiovascular: Normal rate, regular rhythm, normal heart sounds and intact distal pulses.   Pulmonary/Chest: Effort normal and breath sounds normal.  Abdominal: Soft. Bowel sounds are normal. She exhibits no distension. There is no tenderness.  Musculoskeletal: Normal  range of motion. She exhibits no edema or tenderness.  Neurological: She is alert and oriented to person, place, and time. She has normal strength. Coordination normal. GCS eye subscore is 4. GCS verbal subscore is 5. GCS motor subscore is 6.  Skin: Skin is warm, dry and intact.  Psychiatric: She has a normal mood and affect.    ED Course  Procedures (including critical care time) Labs Review Labs Reviewed  COMPREHENSIVE METABOLIC PANEL - Abnormal; Notable for the following:    Potassium 3.3 (*)    CO2 19 (*)    Glucose, Bld 105 (*)    Calcium 8.7 (*)    ALT 11 (*)    All other components within normal limits  CBC - Abnormal; Notable for the following:    WBC 21.8 (*)    Hemoglobin 11.4 (*)    HCT 35.3 (*)    RDW 16.2 (*)    All other components within normal limits  CBG MONITORING, ED - Abnormal; Notable for the following:    Glucose-Capillary 103 (*)    All other components within normal limits  LIPASE, BLOOD  URINALYSIS, ROUTINE W REFLEX MICROSCOPIC (NOT AT [facility])    Imaging Review Dg Chest 2 View  10/15/2015  CLINICAL DATA:  Cough for several weeks EXAM: CHEST  2 VIEW COMPARISON:  08/24/2015 FINDINGS: Cardiac shadow is stable. The lungs are well aerated bilaterally. Minimal basilar atelectatic changes are seen. No focal infiltrate is seen. Prominent  epicardial fat pad is noted on the right stable from the prior exam. No bony abnormality is seen. IMPRESSION: Mild bibasilar atelectasis without focal confluent infiltrate. Electronically Signed   By: Inez Catalina M.D.   On: 10/15/2015 08:03   I have personally reviewed and evaluated these images and lab results as part of my medical decision-making.   EKG Interpretation None      MDM   Final diagnoses:  Gastroenteritis  Chronic obstructive pulmonary disease, unspecified COPD type ([place])   Patient presents with symptoms consistent with viral gastroenteritis. She reports multiple episodes of both vomiting and diarrhea concurrently for several hours. She does not have significant associated abdominal pain and her abdominal examination is soft without guarding or peritoneal signs. There has not been recurrent vomiting in the emergency department, there have not been episodes of diarrhea. At this time symptoms appear to be improving. The patient has been hydrated with a liter of IV fluid. She'll be given Zofran and Imodium to take with instructions on frequent sips of clear fluids. She should has COPD. She reports coughing and sputum production. Lung exam is clear with good airflow. Diagnostic x-ray does not show focal infiltrate. Review be MR indicates patient has had treatment at the beginning of the month with Zithromax and likely Rocephin without significant change in chronic reports of cough and URI symptoms. At this time I do not feel that antibiotics are indicated. Patient does continue to smoke and is counseled on smoking cessation for improvement of symptoms.    Charlesetta Shanks, MD 10/15/15 [phone]

## 2015-11-03 IMAGING — CR DG FOREARM 2V*L*
2 series · 2 of 2 positions shown · non-contrast
Comparison: 02/05/2009

CLINICAL DATA: Fall, arm pain

EXAM:
LEFT FOREARM - 2 VIEW

[x forearm ap left]
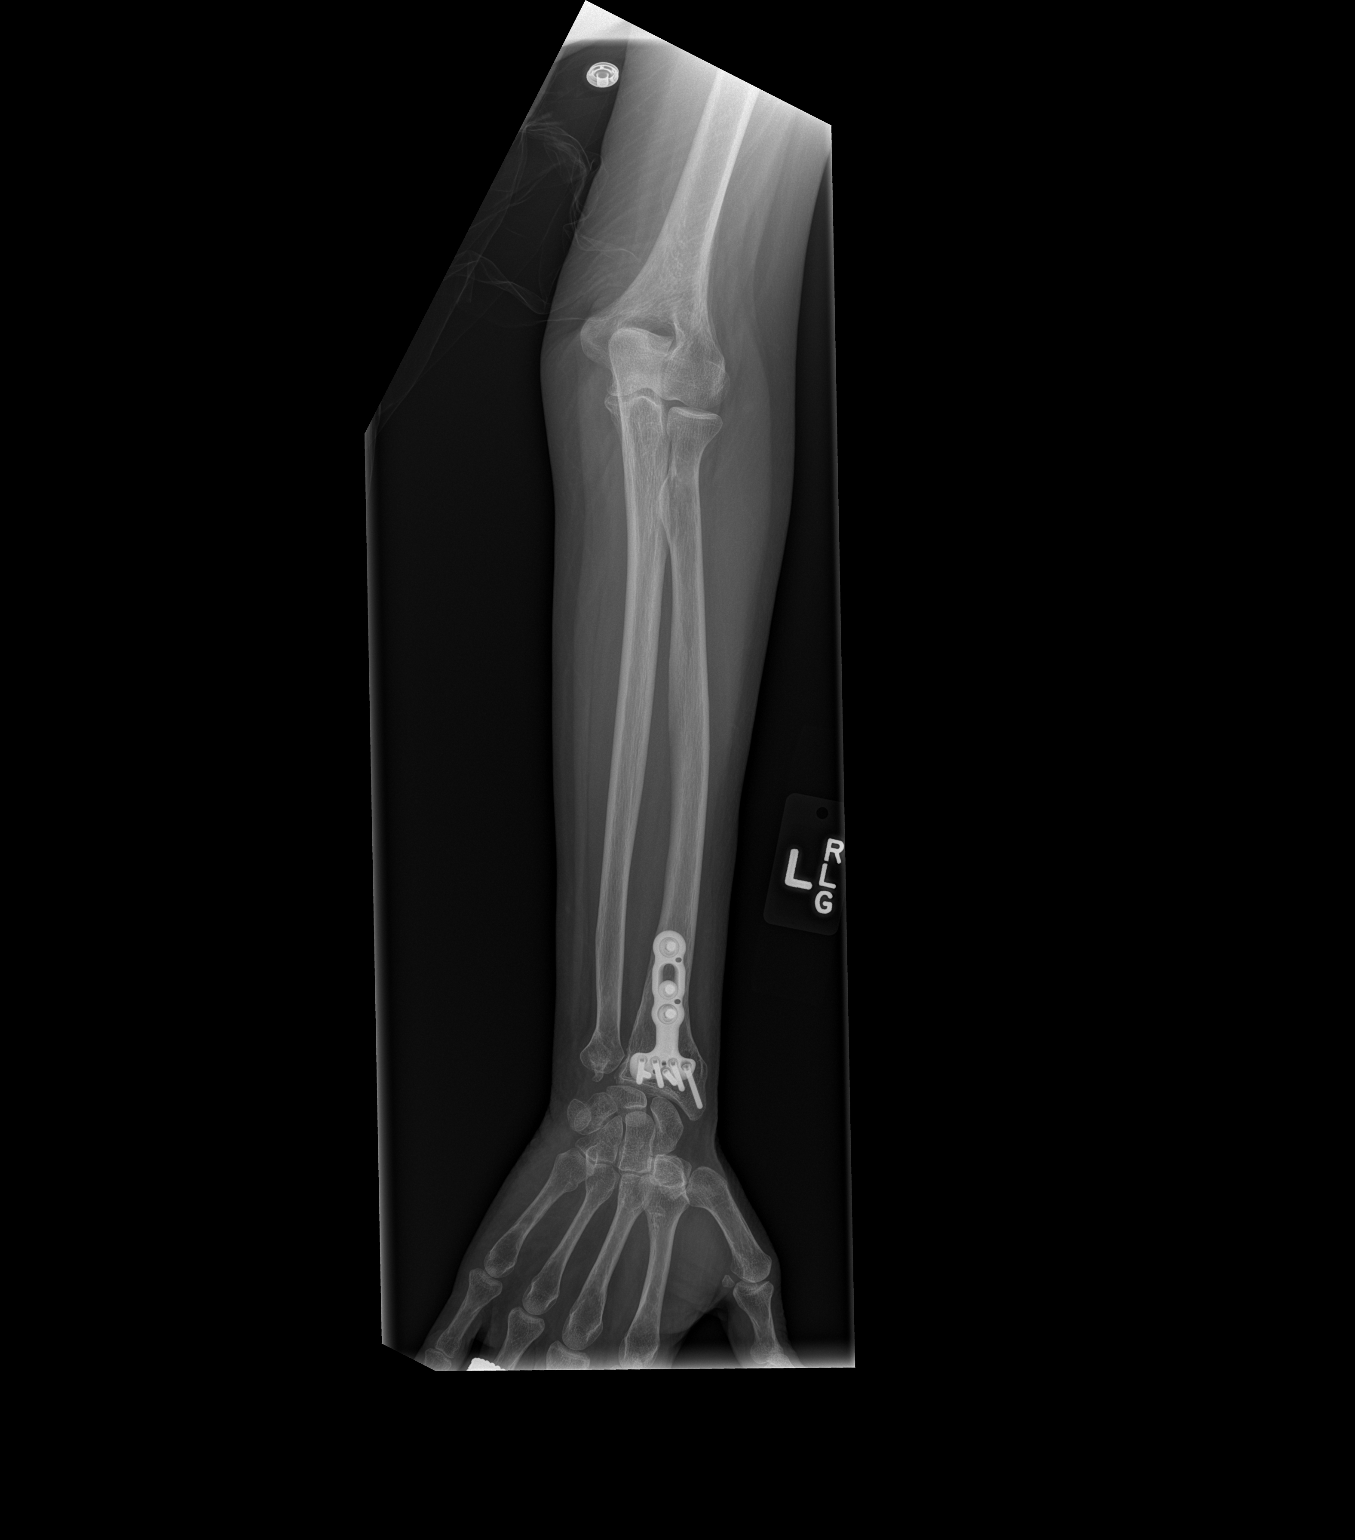

[x forearm lat left]
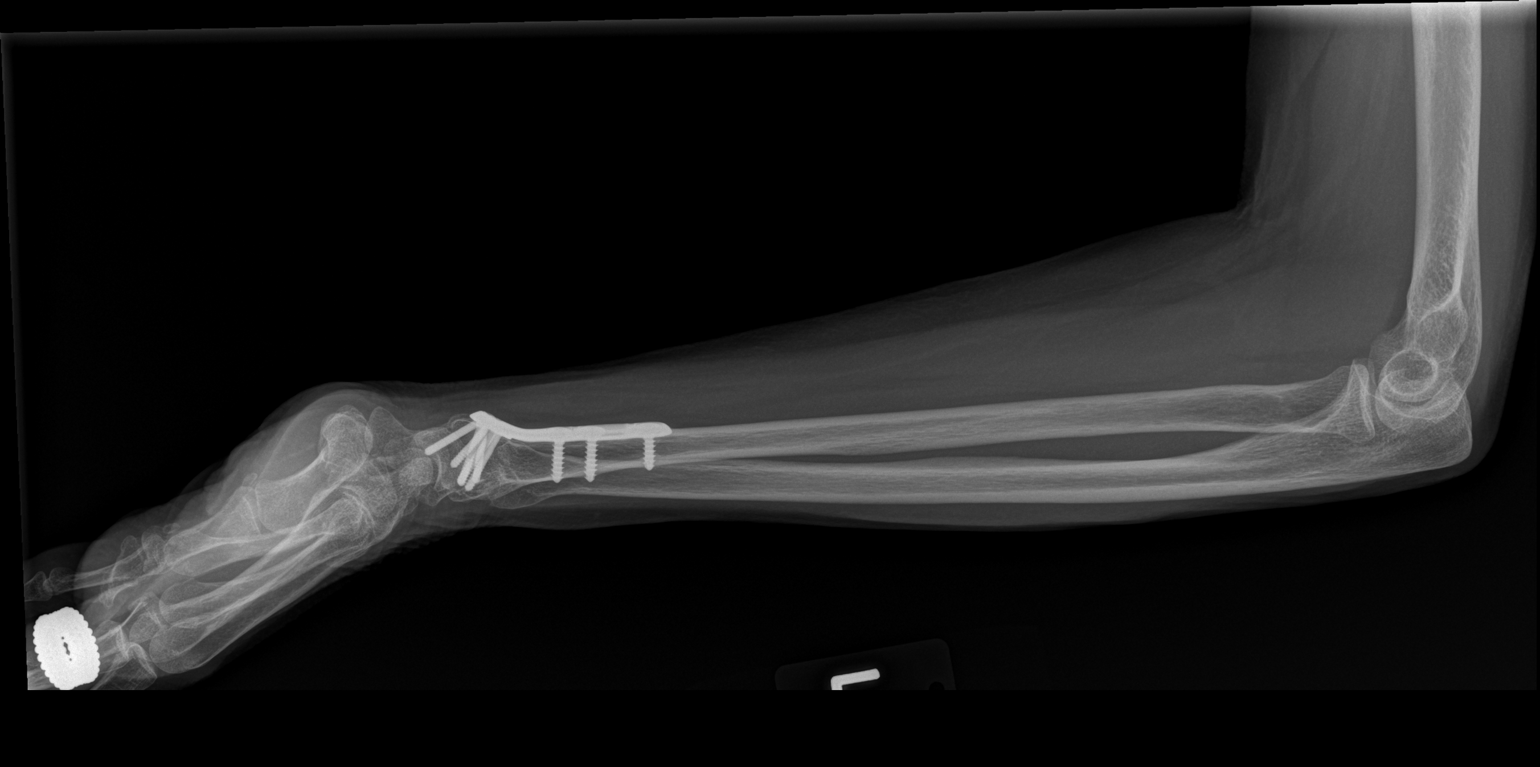

[2 of 2 positions shown; findings below may reference images not displayed]

FINDINGS: Previous left distal radius fracture, s/p ORIF. Healed left distal
ulna fracture. Normal alignment. No acute fracture. No soft tissue
abnormality.
IMPRESSION: No acute osseous finding.  Stable postoperative findings

## 2015-11-03 IMAGING — CR DG KNEE COMPLETE 4+V*L*
4 series · 4 of 4 positions shown · non-contrast
Comparison: None.

CLINICAL DATA: Fall, left knee pain, abrasion

EXAM:
LEFT KNEE - COMPLETE 4+ VIEW

[t knee ap left]
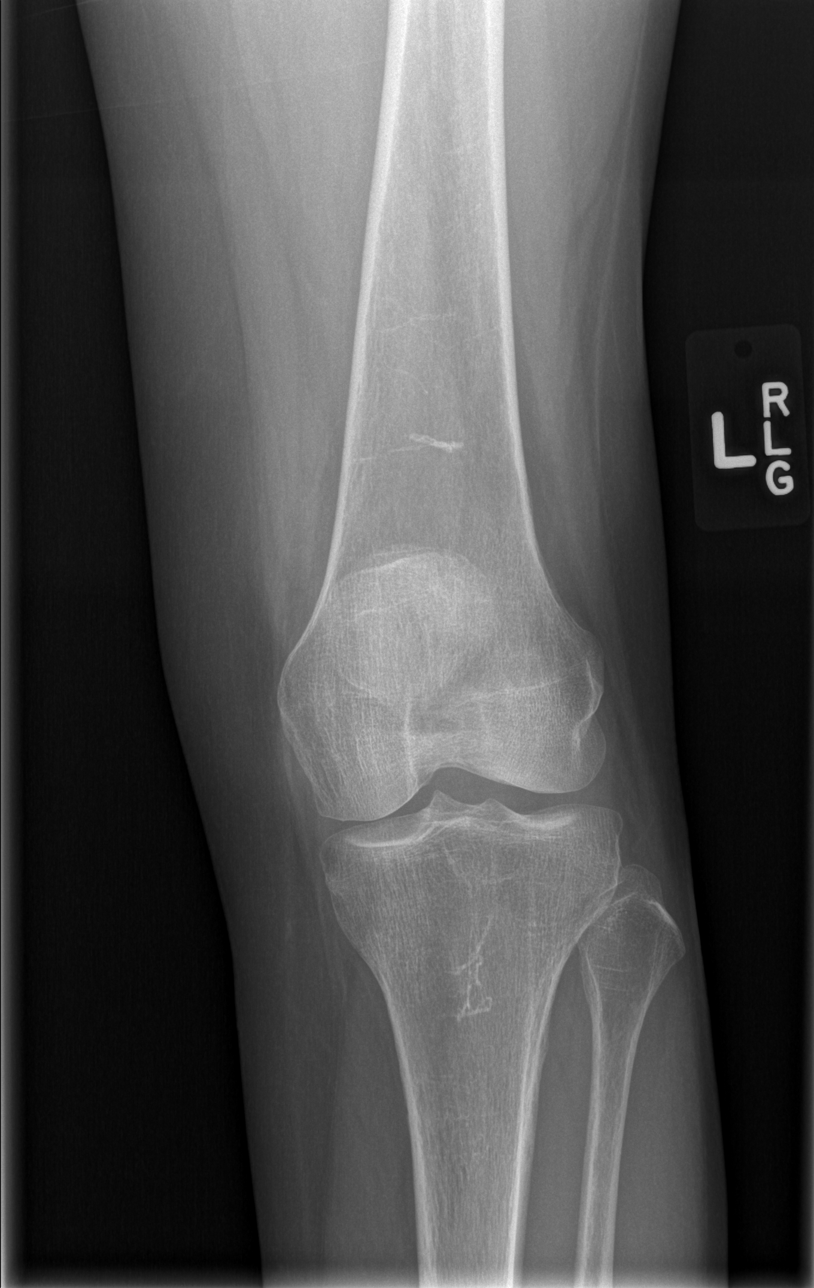

[t knee obl left (1 of 2)]
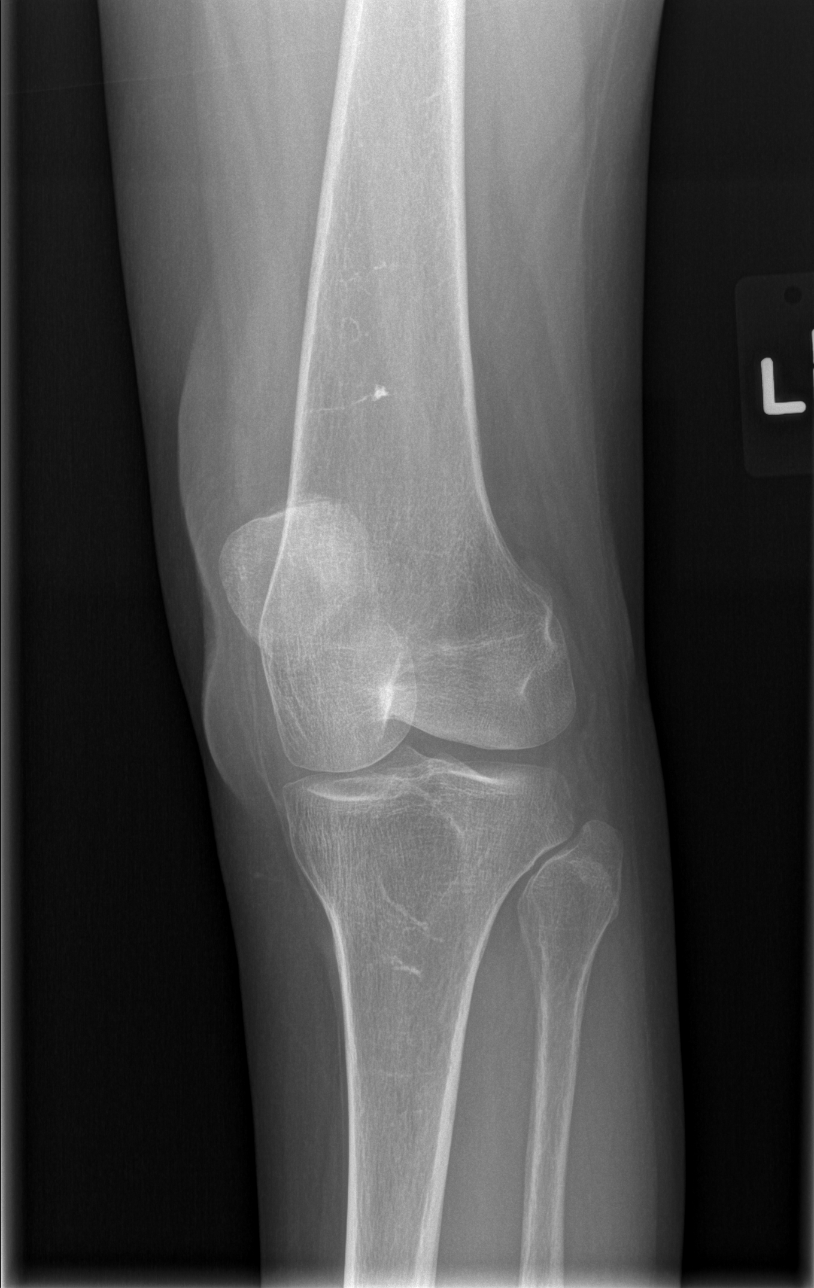

[t knee obl left (2 of 2)]
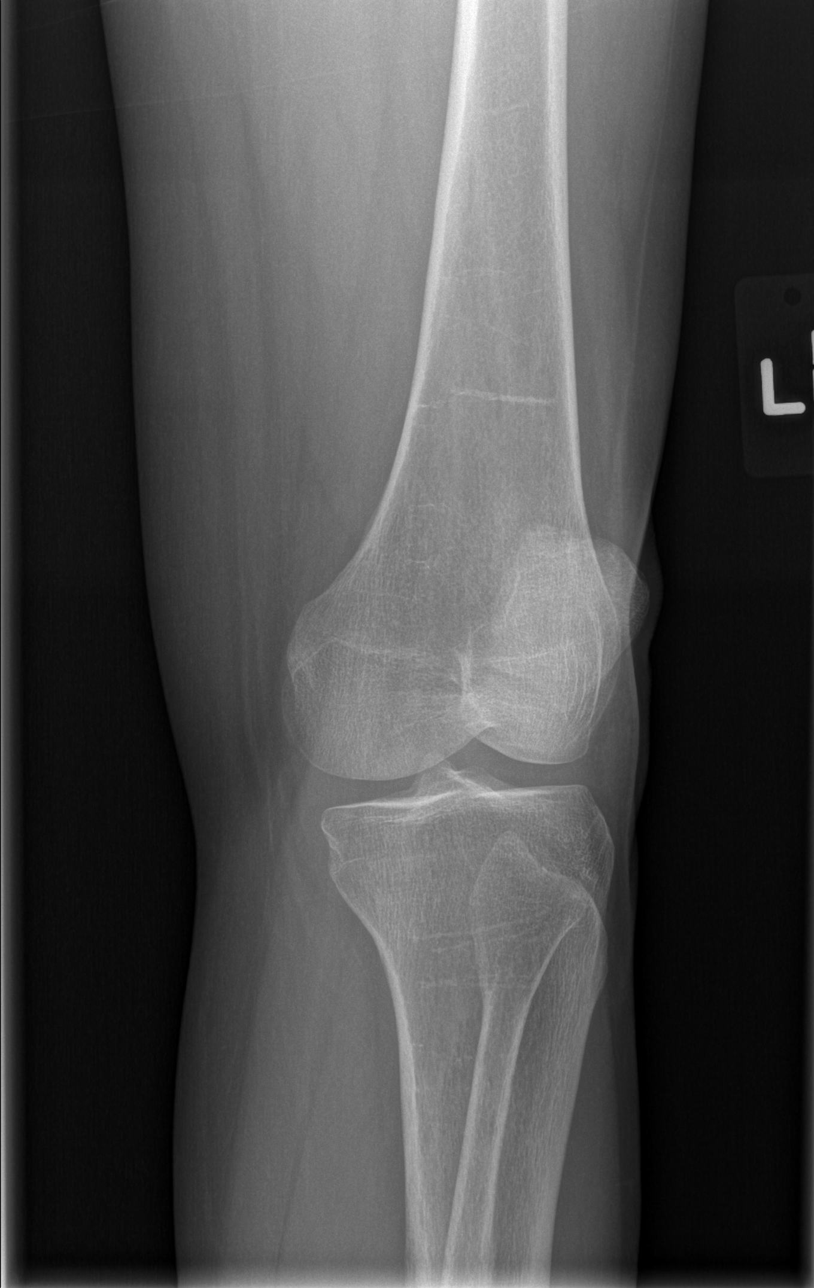

[t knee lat left]
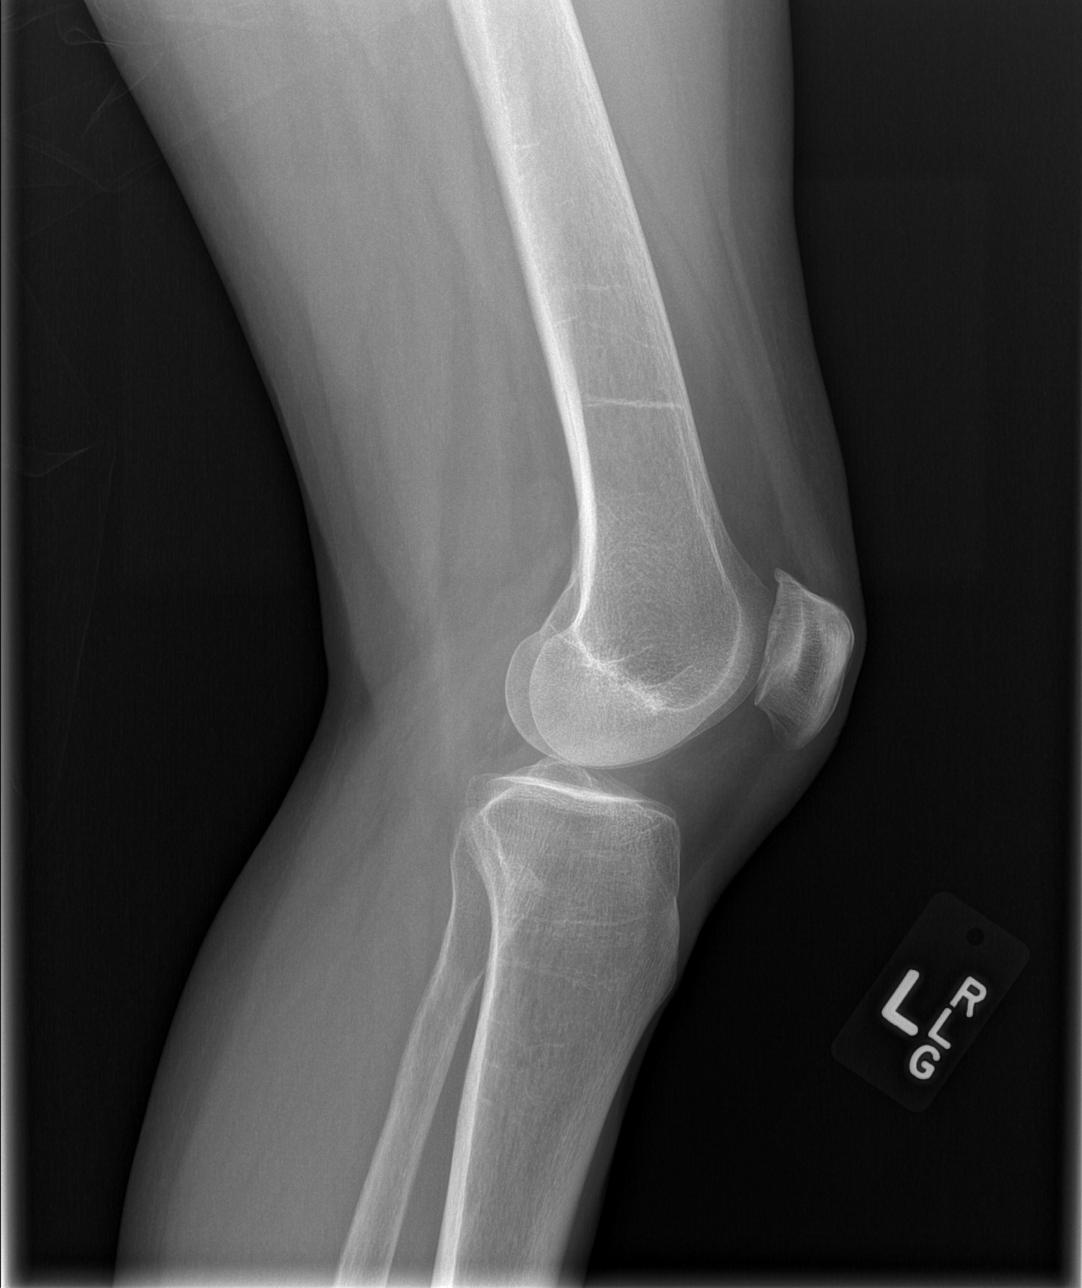

[4 of 4 positions shown; findings below may reference images not displayed]

FINDINGS: Bones are osteopenic. Normal alignment without fracture or effusion.
Preserved joint spaces. No significant arthropathy.
IMPRESSION: No acute osseous finding.  Osteopenia.

## 2015-11-03 IMAGING — CR DG HUMERUS 2V *L*
3 series · 3 of 3 positions shown · non-contrast
Comparison: 02/10/2005

CLINICAL DATA: Fall, left arm pain

EXAM:
LEFT HUMERUS - 2+ VIEW

[x humerus ap left (1 of 2)]
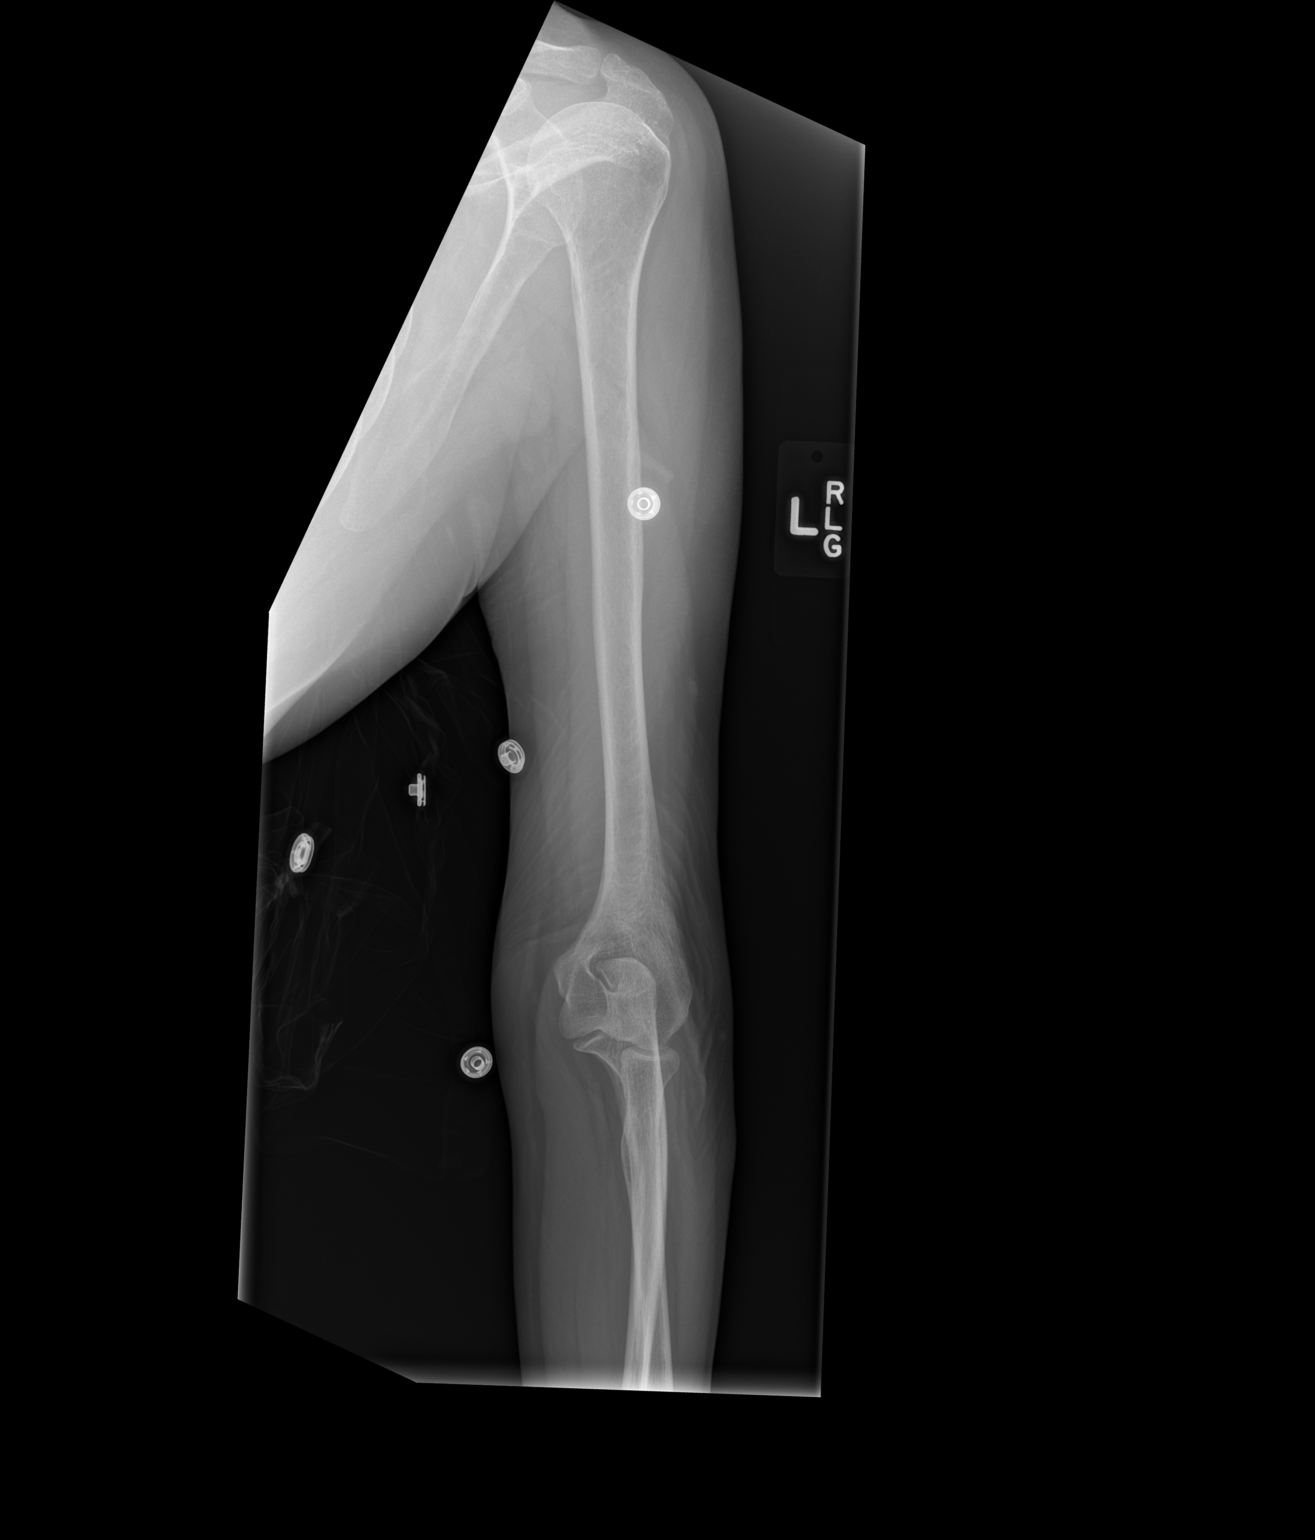

[x humerus ap left (2 of 2)]
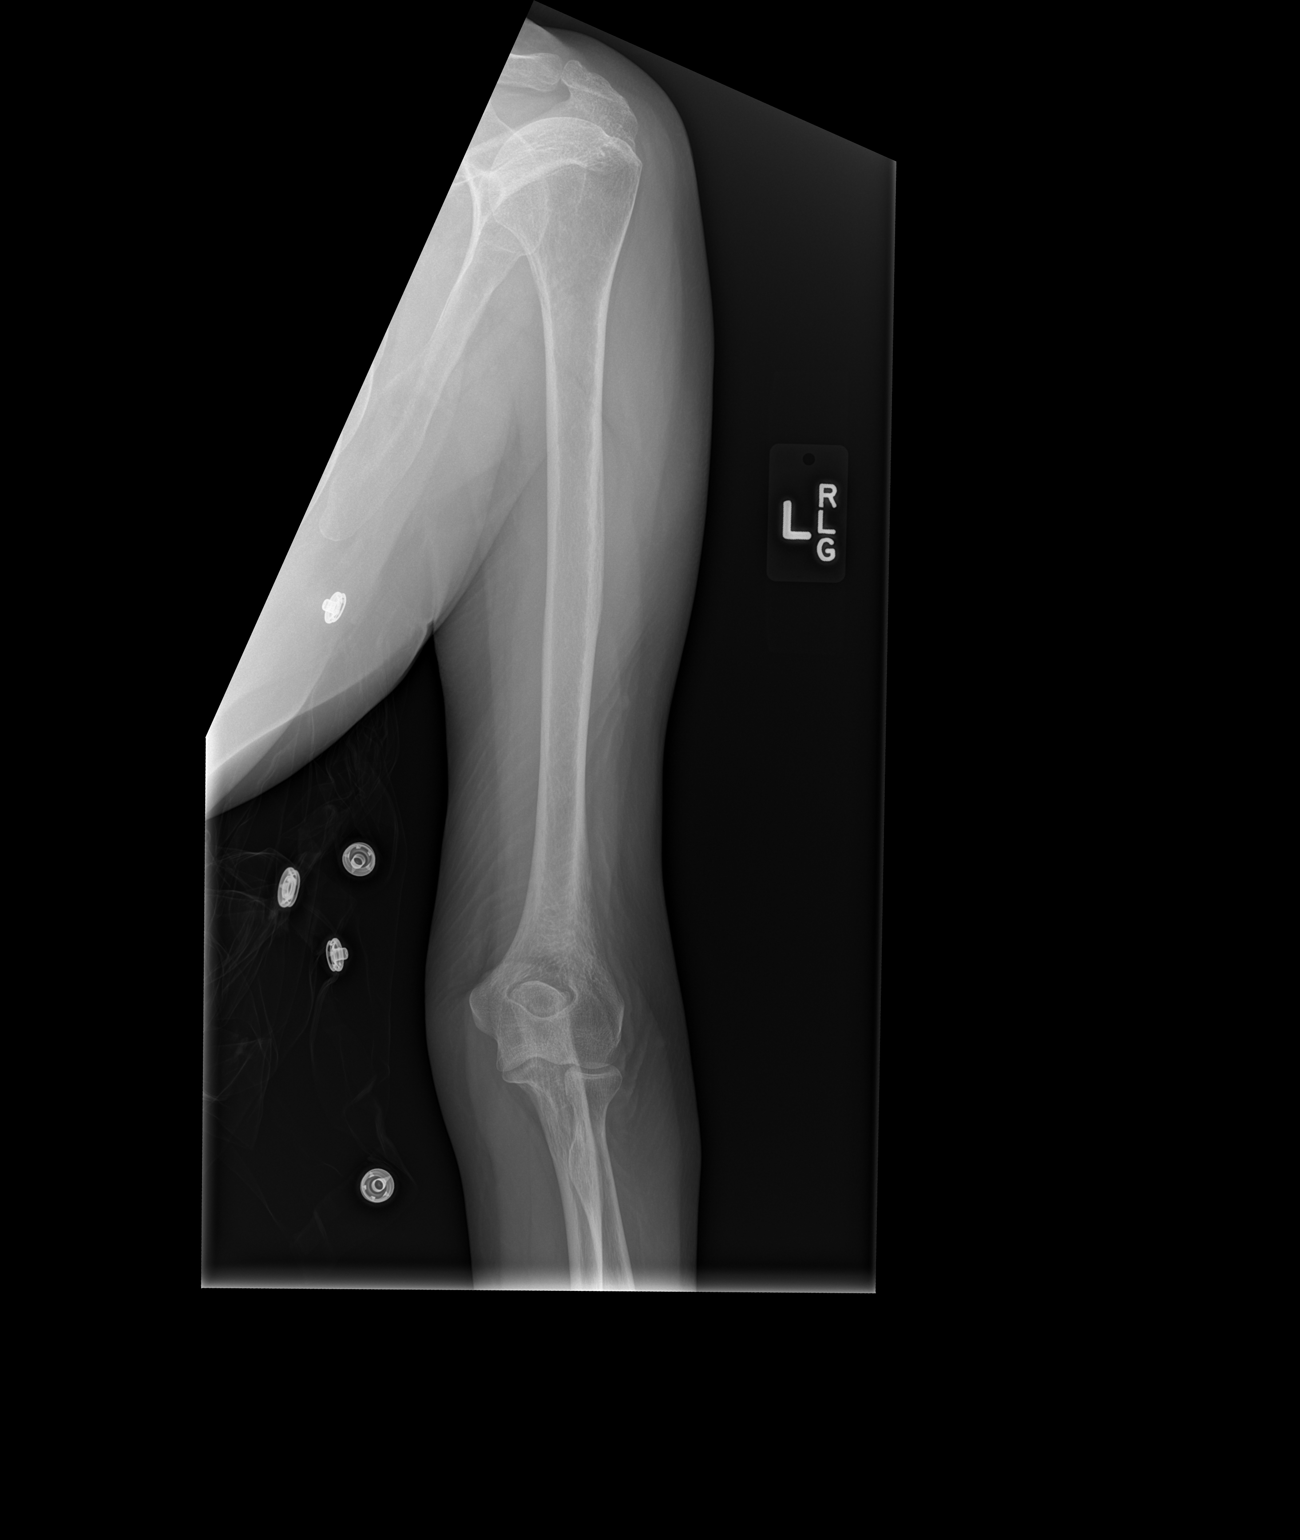

[x humerus lat left]
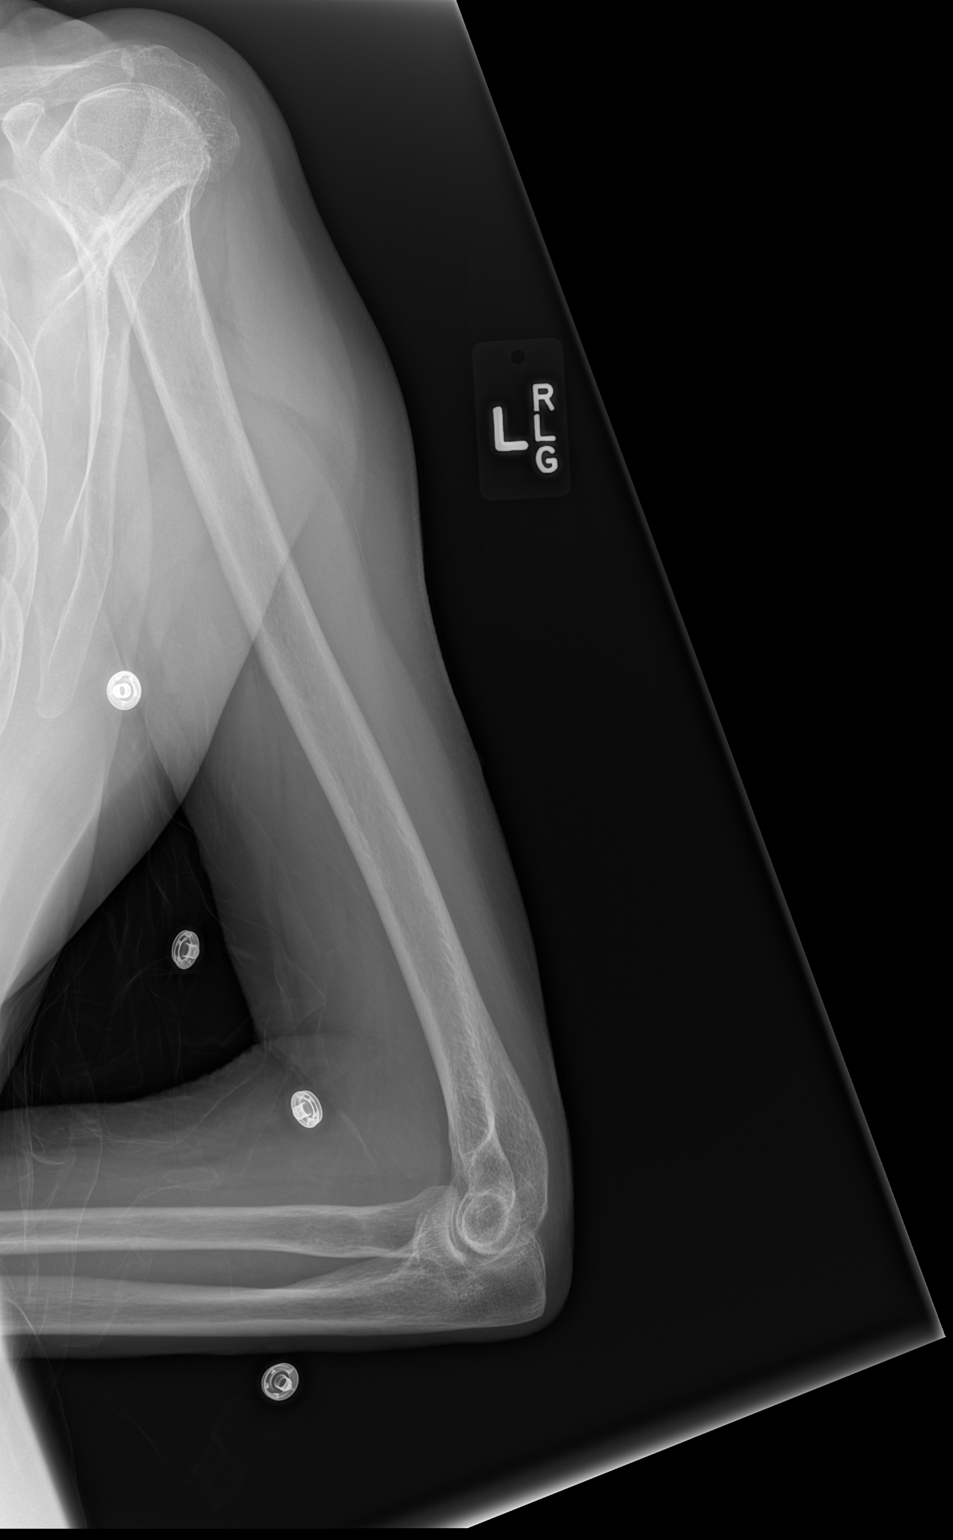

[3 of 3 positions shown; findings below may reference images not displayed]

FINDINGS: Left humerus intact. Normal alignment. No fracture evident. Soft
tissues unremarkable.
IMPRESSION: No acute osseous finding

## 2015-12-12 ENCOUNTER — Other Ambulatory Visit: Payer: Self-pay | Admitting: Orthopedic Surgery

## 2015-12-12 DIAGNOSIS — M25562 Pain in left knee: Secondary | ICD-10-CM

## 2015-12-19 ENCOUNTER — Ambulatory Visit
Admission: RE | Admit: 2015-12-19 | Discharge: 2015-12-19 | Disposition: A | Discharge status: Home / Self Care | Payer: Medicare Other | Source: Ambulatory Visit | Attending: Orthopedic Surgery | Admitting: Orthopedic Surgery

## 2015-12-19 DIAGNOSIS — M25562 Pain in left knee: Secondary | ICD-10-CM

## 2016-01-10 ENCOUNTER — Other Ambulatory Visit: Payer: Self-pay | Admitting: Orthopedic Surgery

## 2016-01-10 DIAGNOSIS — M5410 Radiculopathy, site unspecified: Secondary | ICD-10-CM

## 2016-01-18 ENCOUNTER — Ambulatory Visit
Admission: RE | Admit: 2016-01-18 | Discharge: 2016-01-18 | Disposition: A | Discharge status: Home / Self Care | Payer: Medicare Other | Source: Ambulatory Visit | Attending: Orthopedic Surgery | Admitting: Orthopedic Surgery

## 2016-01-18 DIAGNOSIS — M5410 Radiculopathy, site unspecified: Secondary | ICD-10-CM

## 2016-01-24 DIAGNOSIS — J3089 Other allergic rhinitis: Secondary | ICD-10-CM | POA: Diagnosis not present

## 2016-01-25 DIAGNOSIS — J3081 Allergic rhinitis due to animal (cat) (dog) hair and dander: Secondary | ICD-10-CM | POA: Diagnosis not present

## 2016-01-29 ENCOUNTER — Encounter: Payer: Self-pay | Admitting: *Deleted

## 2016-01-29 NOTE — Progress Notes (Signed)
Immunotherapy   Patient Details  Name: GLORIANNE GUISINGER MRN: [id number] Date of Birth: [birth year]  01/29/2016  [name] Mailed out 2 Red Vials 1:100 <CAT-DOG/MOLD> Following schedule: C  Frequency:1 time per week @ 0.50 EVERY 4 WEEKS Epi-Pen:Epi-Pen Available  Consent signed and patient instructions given. MAILED TO [facility] [address] [place], [place] [address]  Orlene Erm 01/29/2016, 1:53 PM

## 2016-02-13 IMAGING — CR DG CHEST 2V
2 series · 2 of 2 positions shown · non-contrast
Comparison: DG CHEST 2 VIEW dated 08/09/2013; DG CHEST 2 VIEW dated
07/03/2013

CLINICAL DATA: Weakness and chills.

EXAM:
CHEST - 2 VIEW

[w chest lat]
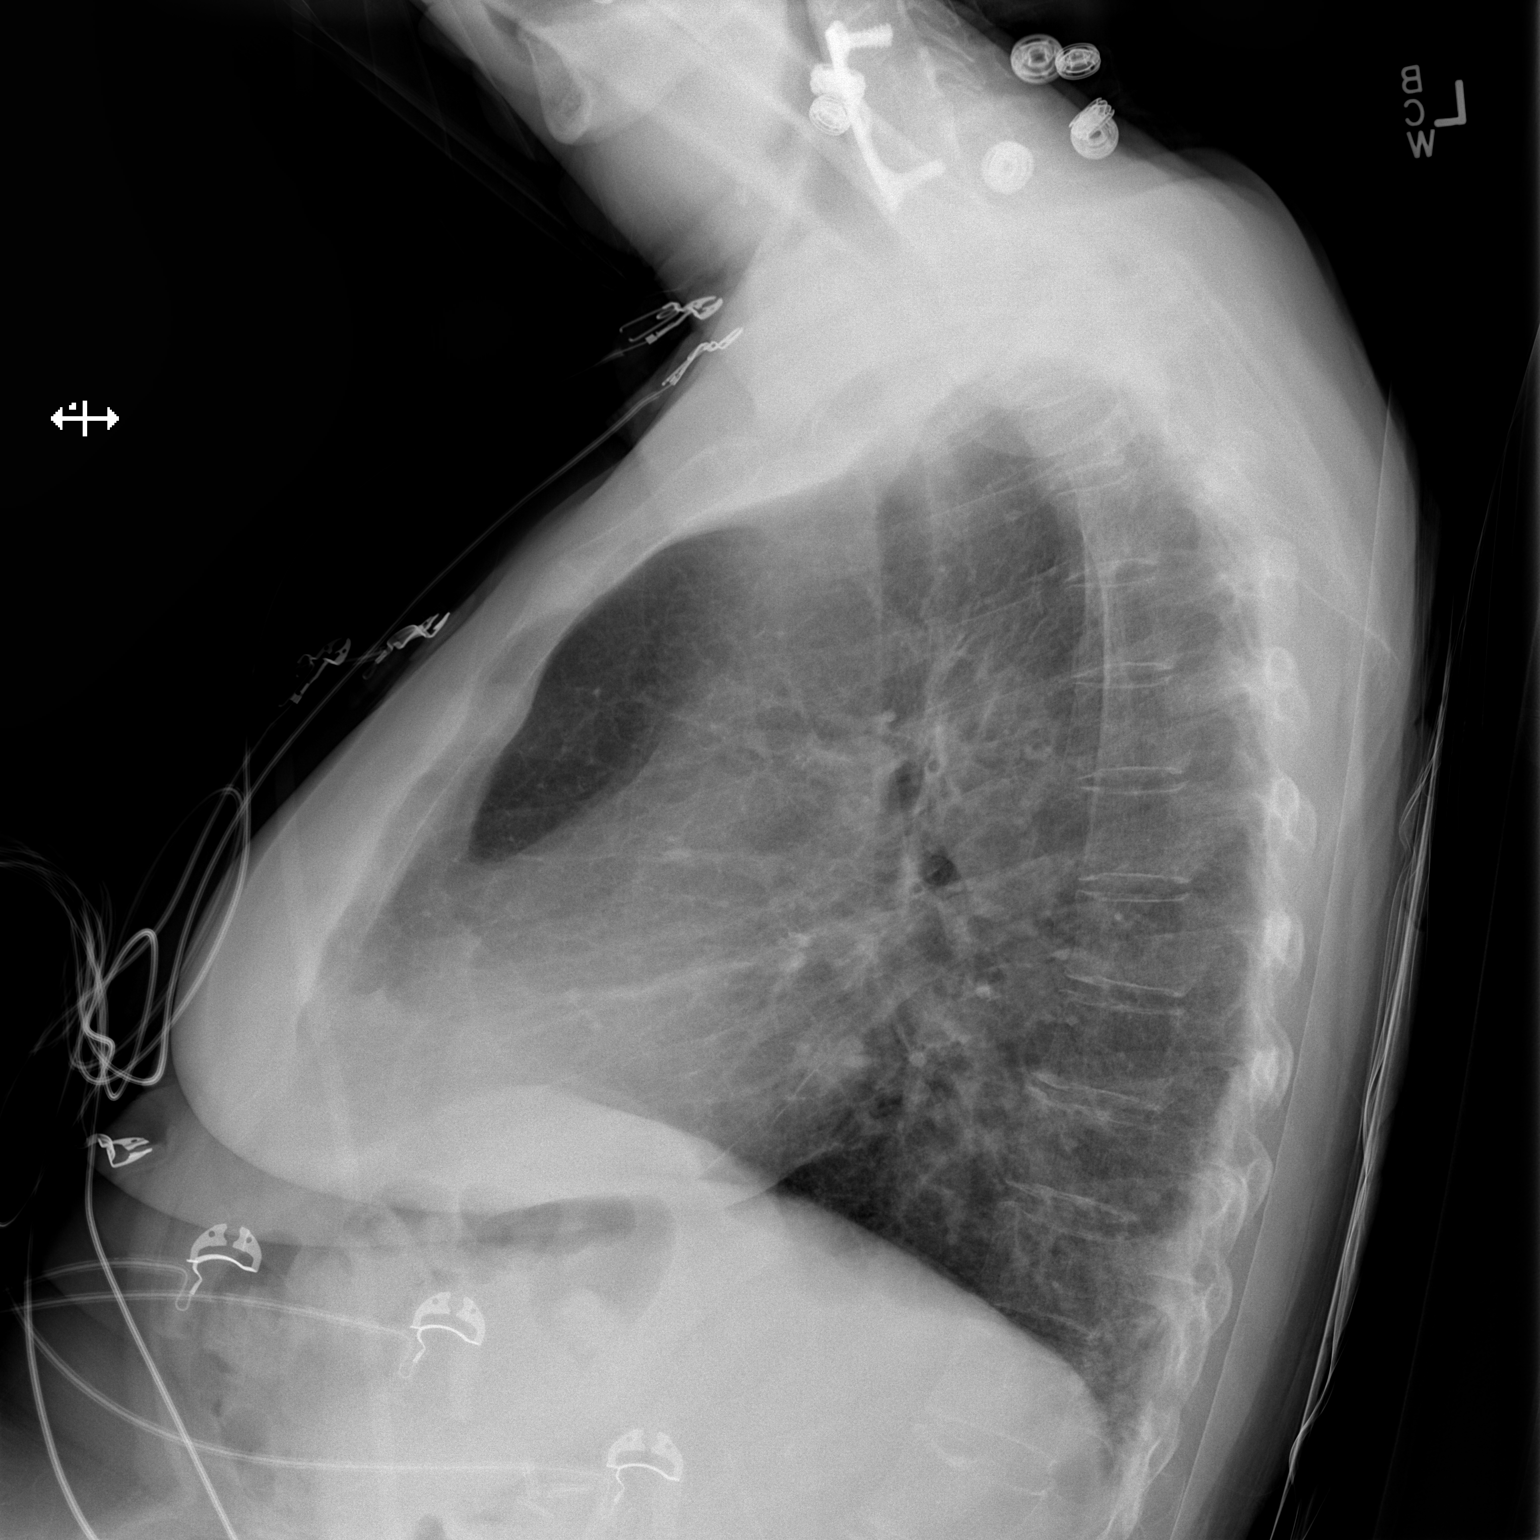

[x chest ap]
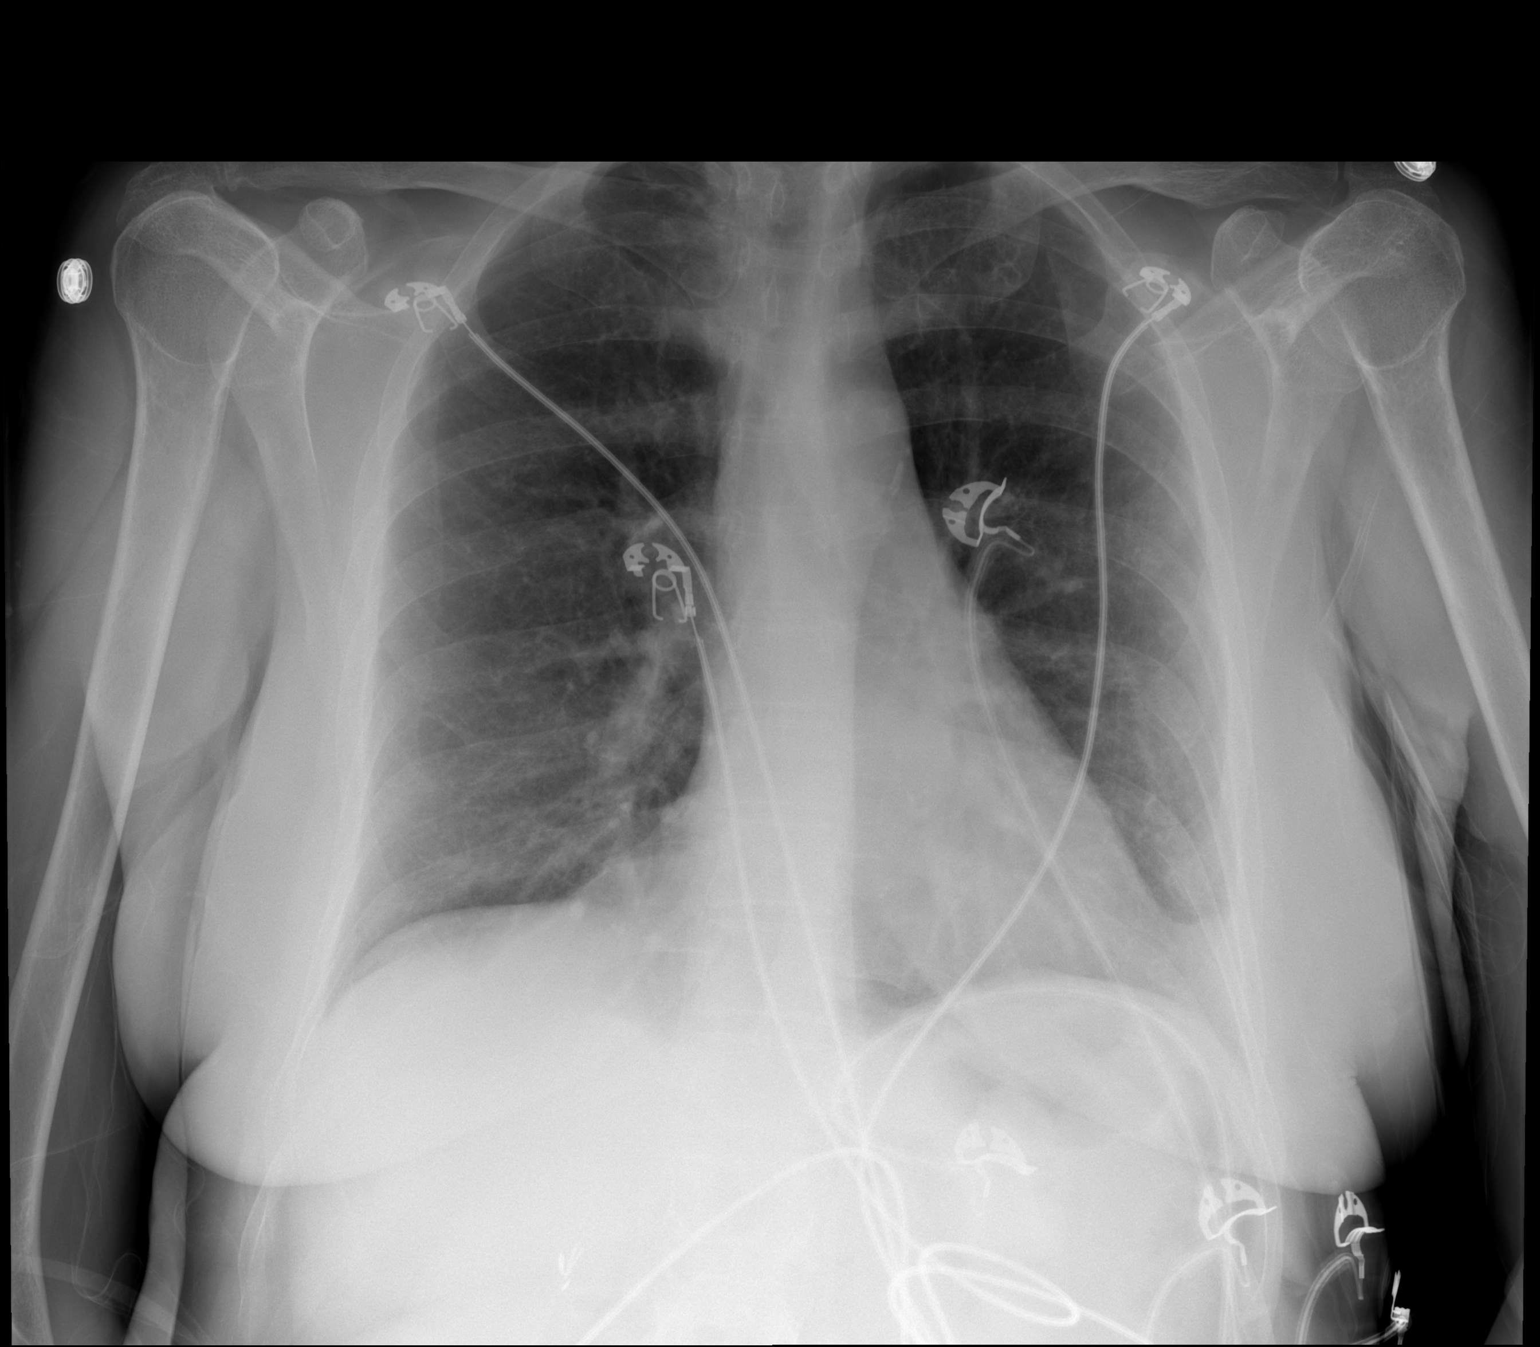

[2 of 2 positions shown; findings below may reference images not displayed]

FINDINGS: The heart size and mediastinal contours are within normal limits.
Stable chronic lung disease. There is no evidence of pulmonary
edema, consolidation, pneumothorax, nodule or pleural fluid. The
visualized skeletal structures are unremarkable.
IMPRESSION: Stable chronic lung disease.  No active disease.

## 2016-02-14 ENCOUNTER — Emergency Department (HOSPITAL_COMMUNITY)
Admission: EM | Admit: 2016-02-14 | Discharge: 2016-02-14 | Disposition: A | Discharge status: Home / Self Care | Payer: Medicare Other | Attending: Emergency Medicine | Admitting: Emergency Medicine

## 2016-02-14 ENCOUNTER — Emergency Department (HOSPITAL_COMMUNITY): Payer: Medicare Other

## 2016-02-14 ENCOUNTER — Encounter (HOSPITAL_COMMUNITY): Payer: Self-pay | Admitting: Emergency Medicine

## 2016-02-14 DIAGNOSIS — F329 Major depressive disorder, single episode, unspecified: Secondary | ICD-10-CM | POA: Diagnosis not present

## 2016-02-14 DIAGNOSIS — Z91040 Latex allergy status: Secondary | ICD-10-CM | POA: Insufficient documentation

## 2016-02-14 DIAGNOSIS — Z79899 Other long term (current) drug therapy: Secondary | ICD-10-CM | POA: Insufficient documentation

## 2016-02-14 DIAGNOSIS — Y92481 Parking lot as the place of occurrence of the external cause: Secondary | ICD-10-CM | POA: Diagnosis not present

## 2016-02-14 DIAGNOSIS — Z7982 Long term (current) use of aspirin: Secondary | ICD-10-CM | POA: Insufficient documentation

## 2016-02-14 DIAGNOSIS — F17210 Nicotine dependence, cigarettes, uncomplicated: Secondary | ICD-10-CM | POA: Diagnosis not present

## 2016-02-14 DIAGNOSIS — Y999 Unspecified external cause status: Secondary | ICD-10-CM | POA: Diagnosis not present

## 2016-02-14 DIAGNOSIS — IMO0002 Reserved for concepts with insufficient information to code with codable children: Secondary | ICD-10-CM

## 2016-02-14 DIAGNOSIS — Y9301 Activity, walking, marching and hiking: Secondary | ICD-10-CM | POA: Insufficient documentation

## 2016-02-14 DIAGNOSIS — S50312A Abrasion of left elbow, initial encounter: Secondary | ICD-10-CM | POA: Insufficient documentation

## 2016-02-14 DIAGNOSIS — J449 Chronic obstructive pulmonary disease, unspecified: Secondary | ICD-10-CM | POA: Diagnosis not present

## 2016-02-14 DIAGNOSIS — M25522 Pain in left elbow: Secondary | ICD-10-CM | POA: Diagnosis present

## 2016-02-14 MED ORDER — HYDROCODONE-ACETAMINOPHEN 5-325 MG PO TABS
2.0000 | ORAL_TABLET | Freq: Once | ORAL | Status: AC
  Administered 2016-02-14: 2 via ORAL
  Filled 2016-02-14: qty 2

## 2016-02-14 MED ORDER — ORPHENADRINE CITRATE ER 100 MG PO TB12: 100.0000 mg | ORAL_TABLET | Freq: Two times a day (BID) | ORAL | Status: DC

## 2016-02-14 MED ORDER — ACETAMINOPHEN 500 MG PO TABS: 1000.0000 mg | ORAL_TABLET | Freq: Four times a day (QID) | ORAL | Status: DC | PRN

## 2016-02-14 NOTE — ED Notes (Signed)
MD at bedside. 
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
## 2016-02-14 NOTE — ED Notes (Signed)
Pt requesting to see the doctor. I explained to the patient that the doctor has signed up for her and will be in as soon as she can.

## 2016-02-14 NOTE — Discharge Instructions (Signed)
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
## 2016-03-11 IMAGING — CR DG CHEST 2V
2 series · 2 of 2 positions shown · non-contrast
Comparison: DG CHEST 2 VIEW dated 12/20/2013;

CLINICAL DATA: Chest pain, cough

EXAM:
CHEST  2 VIEW

[w chest pa]
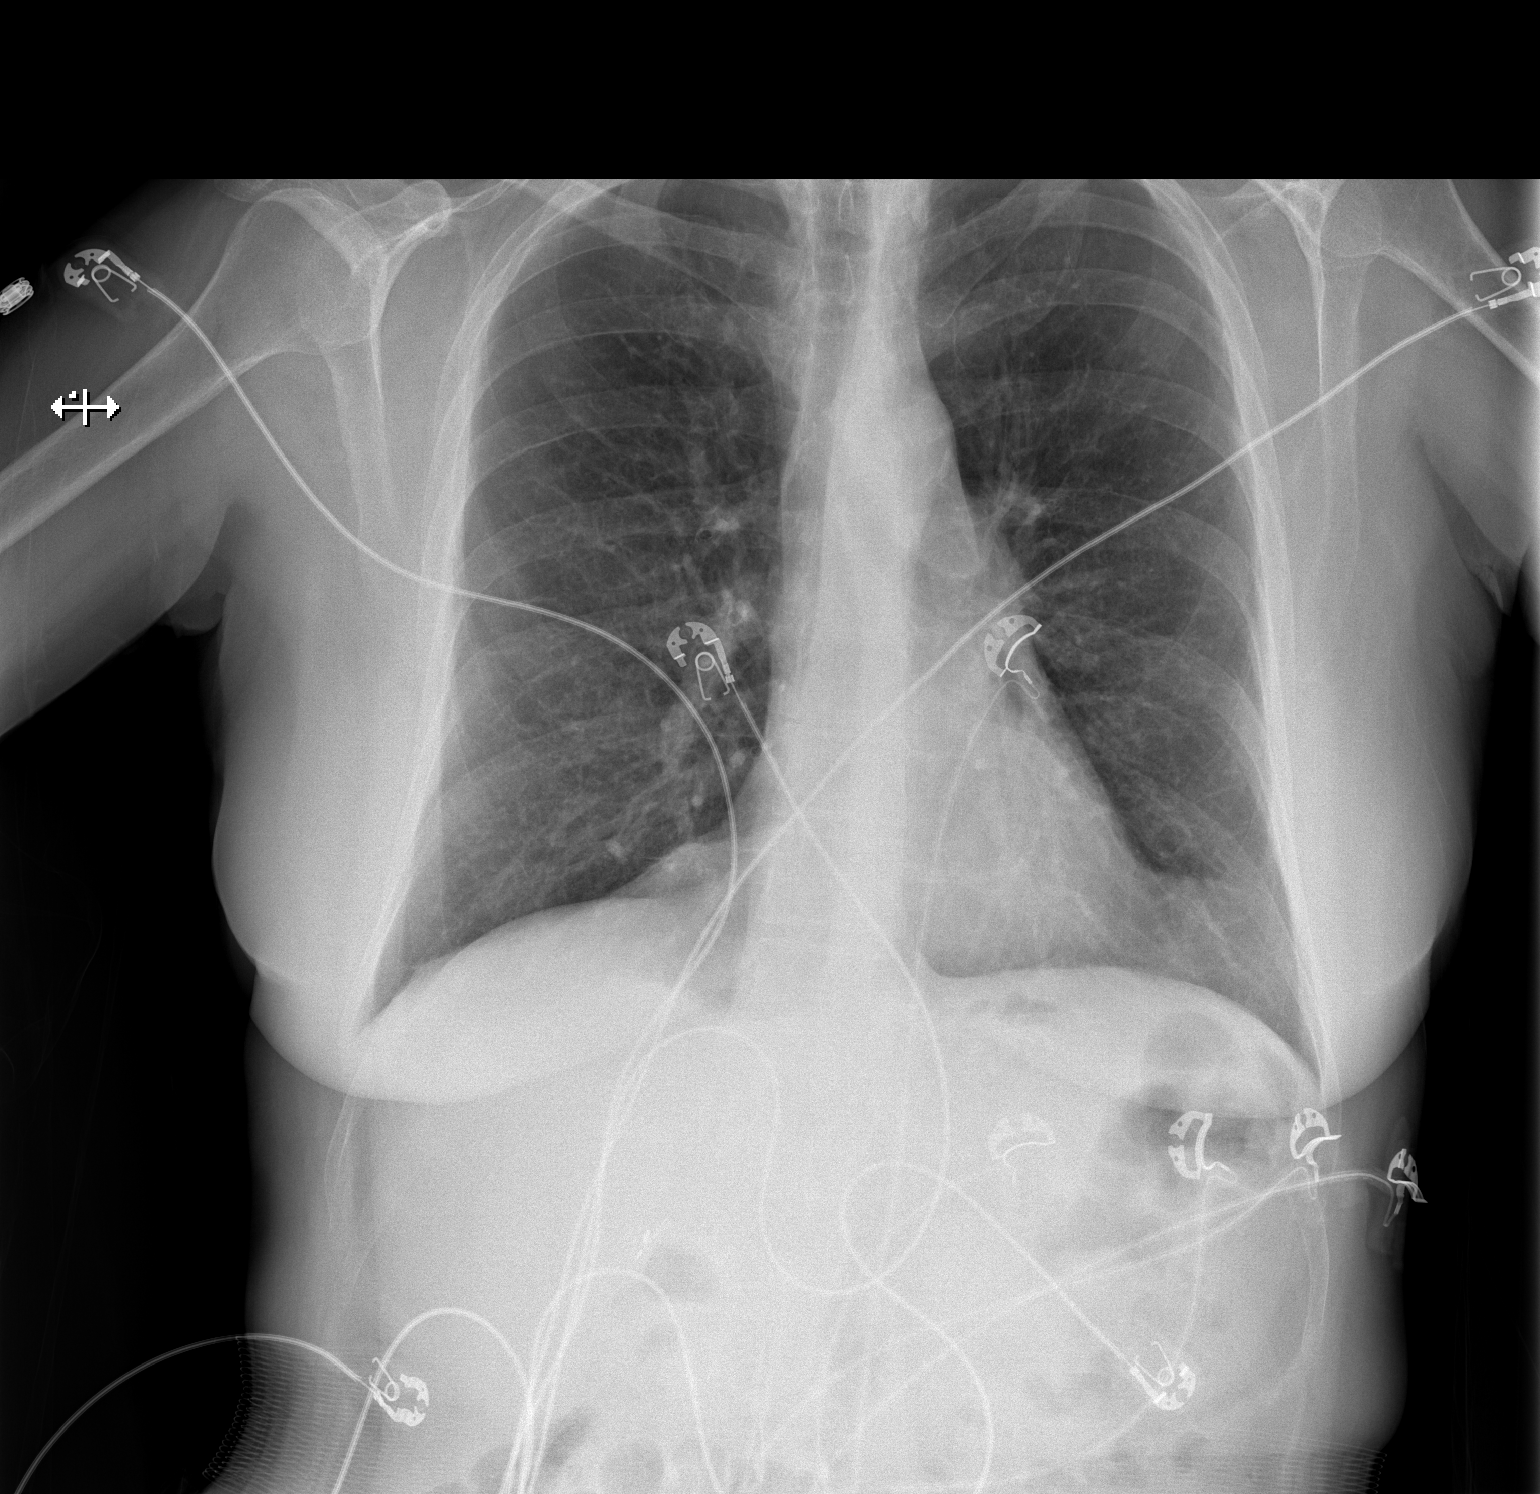

[w chest lat]
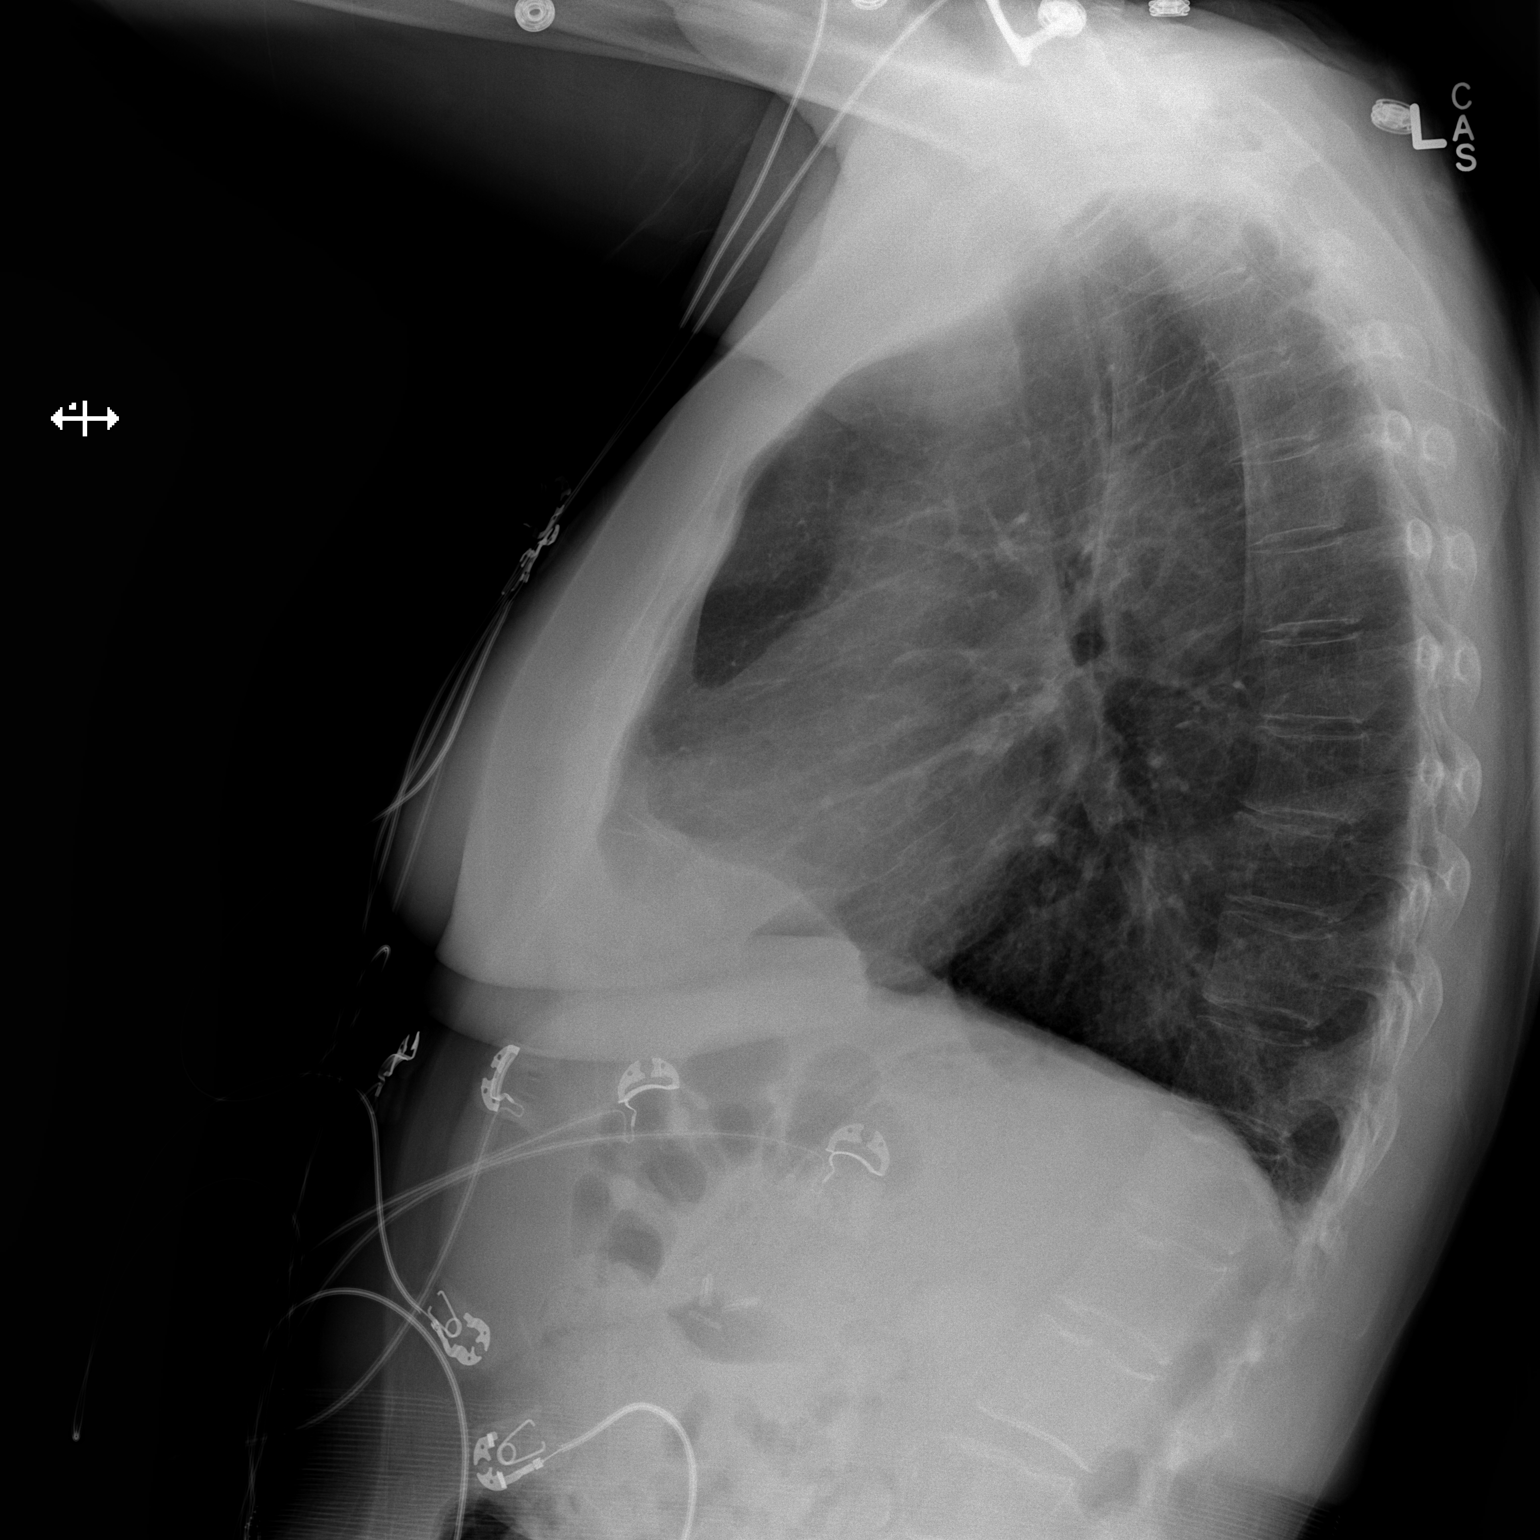

[2 of 2 positions shown; findings below may reference images not displayed]

DG CHEST 2 VIEW dated
08/09/2013; DG CHEST 2 VIEW dated 07/03/2013; DG CHEST 2 VIEW dated
09/05/2012; DG RIBS UNILATERAL W/CHEST*L* dated 05/17/2012
FINDINGS: There are bilateral mild chronic interstitial changes. There is no
focal parenchymal opacity, pleural effusion, or pneumothorax. The
heart and mediastinal contours are unremarkable.

The osseous structures are unremarkable.
IMPRESSION: No active cardiopulmonary disease.

## 2016-04-05 ENCOUNTER — Other Ambulatory Visit: Payer: Self-pay | Admitting: Specialist

## 2016-04-05 DIAGNOSIS — Z1231 Encounter for screening mammogram for malignant neoplasm of breast: Secondary | ICD-10-CM

## 2016-04-15 ENCOUNTER — Other Ambulatory Visit (HOSPITAL_COMMUNITY): Payer: Self-pay | Admitting: Orthopedic Surgery

## 2016-04-15 ENCOUNTER — Other Ambulatory Visit: Payer: Self-pay | Admitting: Orthopedic Surgery

## 2016-04-15 DIAGNOSIS — M25562 Pain in left knee: Secondary | ICD-10-CM

## 2016-04-18 ENCOUNTER — Encounter (HOSPITAL_COMMUNITY): Payer: Medicare Other

## 2016-04-18 ENCOUNTER — Ambulatory Visit (HOSPITAL_COMMUNITY): Payer: Medicare Other

## 2016-04-19 ENCOUNTER — Ambulatory Visit (HOSPITAL_COMMUNITY)
Admission: RE | Admit: 2016-04-19 | Discharge: 2016-04-19 | Disposition: A | Discharge status: Home / Self Care | Payer: Medicare Other | Source: Ambulatory Visit | Attending: Orthopedic Surgery | Admitting: Orthopedic Surgery

## 2016-04-19 ENCOUNTER — Encounter (HOSPITAL_COMMUNITY)
Admission: RE | Admit: 2016-04-19 | Discharge: 2016-04-19 | Disposition: A | Discharge status: Home / Self Care | Payer: Medicare Other | Source: Ambulatory Visit | Attending: Orthopedic Surgery | Admitting: Orthopedic Surgery

## 2016-04-19 DIAGNOSIS — M25562 Pain in left knee: Secondary | ICD-10-CM | POA: Diagnosis not present

## 2016-04-19 MED ORDER — TECHNETIUM TC 99M MEDRONATE IV KIT
25.0000 | PACK | Freq: Once | INTRAVENOUS | Status: AC | PRN
  Administered 2016-04-19: 25.8 via INTRAVENOUS

## 2016-05-08 ENCOUNTER — Ambulatory Visit: Payer: Medicare Other

## 2016-05-10 ENCOUNTER — Emergency Department (HOSPITAL_COMMUNITY)
Admission: EM | Admit: 2016-05-10 | Discharge: 2016-05-10 | Disposition: A | Discharge status: Home / Self Care | Payer: Medicare Other | Attending: Emergency Medicine | Admitting: Emergency Medicine

## 2016-05-10 ENCOUNTER — Emergency Department (HOSPITAL_COMMUNITY): Payer: Medicare Other

## 2016-05-10 ENCOUNTER — Encounter (HOSPITAL_COMMUNITY): Payer: Self-pay | Admitting: *Deleted

## 2016-05-10 DIAGNOSIS — S8991XA Unspecified injury of right lower leg, initial encounter: Secondary | ICD-10-CM | POA: Diagnosis present

## 2016-05-10 DIAGNOSIS — T148XXA Other injury of unspecified body region, initial encounter: Secondary | ICD-10-CM

## 2016-05-10 DIAGNOSIS — F17210 Nicotine dependence, cigarettes, uncomplicated: Secondary | ICD-10-CM | POA: Insufficient documentation

## 2016-05-10 DIAGNOSIS — S8261XA Displaced fracture of lateral malleolus of right fibula, initial encounter for closed fracture: Secondary | ICD-10-CM

## 2016-05-10 DIAGNOSIS — J440 Chronic obstructive pulmonary disease with acute lower respiratory infection: Secondary | ICD-10-CM | POA: Insufficient documentation

## 2016-05-10 DIAGNOSIS — Z91040 Latex allergy status: Secondary | ICD-10-CM | POA: Insufficient documentation

## 2016-05-10 DIAGNOSIS — S8392XA Sprain of unspecified site of left knee, initial encounter: Secondary | ICD-10-CM | POA: Insufficient documentation

## 2016-05-10 DIAGNOSIS — W1809XA Striking against other object with subsequent fall, initial encounter: Secondary | ICD-10-CM | POA: Diagnosis not present

## 2016-05-10 DIAGNOSIS — Y999 Unspecified external cause status: Secondary | ICD-10-CM | POA: Diagnosis not present

## 2016-05-10 DIAGNOSIS — Y9389 Activity, other specified: Secondary | ICD-10-CM | POA: Insufficient documentation

## 2016-05-10 DIAGNOSIS — Z23 Encounter for immunization: Secondary | ICD-10-CM | POA: Insufficient documentation

## 2016-05-10 DIAGNOSIS — W19XXXA Unspecified fall, initial encounter: Secondary | ICD-10-CM

## 2016-05-10 DIAGNOSIS — Z79899 Other long term (current) drug therapy: Secondary | ICD-10-CM | POA: Diagnosis not present

## 2016-05-10 DIAGNOSIS — J45909 Unspecified asthma, uncomplicated: Secondary | ICD-10-CM | POA: Insufficient documentation

## 2016-05-10 DIAGNOSIS — Y929 Unspecified place or not applicable: Secondary | ICD-10-CM | POA: Diagnosis not present

## 2016-05-10 DIAGNOSIS — Z7982 Long term (current) use of aspirin: Secondary | ICD-10-CM | POA: Insufficient documentation

## 2016-05-10 MED ORDER — OXYCODONE-ACETAMINOPHEN 5-325 MG PO TABS
1.0000 | ORAL_TABLET | Freq: Once | ORAL | Status: AC
  Administered 2016-05-10: 1 via ORAL
  Filled 2016-05-10: qty 1

## 2016-05-10 MED ORDER — TETANUS-DIPHTH-ACELL PERTUSSIS 5-2.5-18.5 LF-MCG/0.5 IM SUSP
0.5000 mL | Freq: Once | INTRAMUSCULAR | Status: AC
  Administered 2016-05-10: 0.5 mL via INTRAMUSCULAR
  Filled 2016-05-10: qty 0.5

## 2016-05-10 MED ORDER — OXYCODONE-ACETAMINOPHEN 5-325 MG PO TABS: 1.0000 | ORAL_TABLET | ORAL | 0 refills | Status: DC | PRN

## 2016-05-10 NOTE — ED Triage Notes (Signed)
Pt complains of pain in her left knee and right ankle since falling today. Pt states she rolled her right ankle and her knee gave out, causing the fall. Pt hit her left knee on concrete. Pt has abrasion to left knee.

## 2016-05-10 NOTE — Discharge Instructions (Signed)
Rest, Ice intermittently (in the first 24-48 hours), Gentle compression with an Ace wrap, and elevate (Limb above the level of the heart)   Take up to 800mg  of ibuprofen (that is usually 4 over the counter pills)  3 times a day for 5 days. Take with food.  Take percocet for breakthrough pain, do not drink alcohol, drive, care for children or do other critical tasks while taking percocet.  Please follow with your primary care doctor in the next 2 days for a check-up. They must obtain records for further management.   Do not hesitate to return to the Emergency Department for any new, worsening or concerning symptoms.

## 2016-05-10 NOTE — ED Provider Notes (Signed)
[facility] DEPT Provider Note   CSN: [id number] Arrival date & time: 05/10/16  1354  By signing my name below, I, Jeanell Sparrow, attest that this documentation has been prepared under the direction and in the presence of non-physician practitioner, Monico Blitz, PA-C. Electronically Signed: Jeanell Sparrow, Scribe. 05/10/2016. 3:02 PM.  History   Chief Complaint Chief Complaint  Patient presents with  . Fall  . Knee Pain  . Ankle Pain   The history is provided by the patient. No language interpreter was used.   HPI Comments: Lori [name] is a 60 y.o. female who presents to the Emergency Department complaining of a fall that occurred today. She states that she twisted her left ankle while taking the trash out, she fell on her left knee, and her face hit cardboard. She reports using a left knee brace chronically has total knee replacement scheduled with Dr. Lorin Mercy. No pain medication taken prior to arrival. Last tetanus shot is unknown. Pain is severe, 10 out of 10 and exacerbated by movement and palpation. She denies numbness, weakness, LOC.   Past Medical History:  Diagnosis Date  . Acute MI ([place])    x3  . Allergy   . Anxiety   . Asthma   . Barrett esophagus 2007  . Chest pain 05-02-2009   echo  EF 55%  . COPD (chronic obstructive pulmonary disease) with chronic bronchitis (HCC)    & Emphysema  . Depression   . Duodenitis without mention of hemorrhage 2007  . Esophageal reflux 2007  . Esophageal stricture   . Full dentures   . H/O cardiovascular stress test 05-02-2009   low risk scan, neg. for ischemia, no ekg changes  . H/O hiatal hernia   . Hiatal hernia [id number]  . Multiple fractures    from falls, fx rt. elbow, fx left wrist, bilateral ankles  . Ulcer     Patient Active Problem List   Diagnosis Date Noted  . Gait difficulty 06/21/2015  . Fibromyalgia 06/21/2015  . Conversion reaction 06/21/2015  . Depression 06/21/2015  . Syncope and collapse 06/21/2015    . Plica syndrome of left knee 01/06/2015  . Bronchitis 09/06/2014  . Anxiety 09/06/2014  . History of MI 09/06/2014  . Costochondritis 07/29/2013  . Chest pain with low risk of acute coronary syndrome 06/27/2013  . Leg fatigue 06/27/2013  . Smoking 06/27/2013  . Borderline hypertension 06/27/2013  . Low back pain 06/14/2013  . Leg pain, bilateral 06/14/2013  . GASTROPARESIS 10/26/2009  . Chest pain 05/02/2009  . HYPERLIPIDEMIA 11/18/2008  . ASTHMA, UNSPECIFIED, UNSPECIFIED STATUS 11/18/2008  . COPD 11/18/2008  . HEPATIC CYST 11/18/2008  . NAUSEA AND VOMITING 11/18/2008    Past Surgical History:  Procedure Laterality Date  . ABDOMINAL HYSTERECTOMY    . CHOLECYSTECTOMY    . CHONDROPLASTY Left 01/06/2015   Procedure: CHONDROPLASTY;  Surgeon: Marybelle Killings, MD;  Location: [facility];  Service: Orthopedics;  Laterality: Left;  . COLONOSCOPY    . ELBOW FRACTURE SURGERY     right  . KNEE ARTHROSCOPY WITH EXCISION PLICA Left 0000000   Procedure: KNEE ARTHROSCOPY WITH EXCISION PLICA;  Surgeon: Marybelle Killings, MD;  Location: [facility];  Service: Orthopedics;  Laterality: Left;  . Lower Extremity Arterial Dopplers  07/06/2013   RABI 1.0, LABI 1.1.; No evidence of significant vascular atherogenic plaque  . NECK SURGERY    . NM MYOVIEW LTD  07/02/2013   Normal LV function, EF 83%. (Likely overestimated  due to low volume);; no evidence of ischemia or infarction. Normal wall motion.  . TONSILLECTOMY    . WRIST FRACTURE SURGERY     left, has plate    OB History    No data available       Home Medications    Prior to Admission medications   Medication Sig Start Date End Date Taking? Authorizing Provider  acetaminophen (TYLENOL) 500 MG tablet Take 2 tablets (1,000 mg total) by mouth every 6 (six) hours as needed. 02/14/16   Charlesetta Shanks, MD  ALPRAZolam Duanne Moron) 1 MG tablet Take 1 tablet by mouth daily as needed for anxiety.  08/17/14   Historical  Provider, MD  aspirin 81 MG chewable tablet Chew 81 mg by mouth every morning.    Historical Provider, MD  cyclobenzaprine (FLEXERIL) 10 MG tablet Take 1 tablet (10 mg total) by mouth 2 (two) times daily as needed for muscle spasms. Patient not taking: Reported on 10/15/2015 04/22/15   Tatyana Kirichenko, PA-C  DEXILANT 60 MG capsule TAKE 1 CAPSULE BY MOUTH ONCE DAILY 10/10/15   Historical Provider, MD  Fluticasone Furoate-Vilanterol (BREO ELLIPTA) 100-25 MCG/INH AEPB Inhale into the lungs 2 (two) times daily.    Historical Provider, MD  loperamide (IMODIUM) 2 MG capsule Take 1 capsule (2 mg total) by mouth 4 (four) times daily as needed for diarrhea or loose stools. Patient not taking: Reported on 02/14/2016 10/15/15   Charlesetta Shanks, MD  meclizine (ANTIVERT) 25 MG tablet Take 1 tablet by mouth 3 (three) times daily.  09/30/14   Historical Provider, MD  montelukast (SINGULAIR) 10 MG tablet Take 10 mg by mouth daily.     Historical Provider, MD  nitroGLYCERIN (NITROSTAT) 0.4 MG SL tablet Place 1 tablet (0.4 mg total) under the tongue every 5 (five) minutes as needed for chest pain. 09/22/15   Leonie Man, MD  ondansetron (ZOFRAN) 4 MG tablet Take 1 tablet (4 mg total) by mouth every 6 (six) hours. Patient not taking: Reported on 02/14/2016 10/15/15   Charlesetta Shanks, MD  orphenadrine (NORFLEX) 100 MG tablet Take 1 tablet (100 mg total) by mouth 2 (two) times daily. 02/14/16   Charlesetta Shanks, MD  oxyCODONE-acetaminophen (PERCOCET/ROXICET) 5-325 MG tablet Take 1 tablet by mouth every 4 (four) hours as needed for severe pain. May take 2 tablets PO q 6 hours for severe pain - Do not take with Tylenol as this tablet already contains tylenol 05/10/16   Elmyra Ricks Vivian Okelley, PA-C  predniSONE (DELTASONE) 10 MG tablet Take 60 mg for 3 days then take 40 mg for 3 days then 20 mg for 2 days then 10 mg for 2 days then stop Patient not taking: Reported on 10/15/2015 10/03/15   Leonie Man, MD  simvastatin (ZOCOR) 20 MG  tablet TK 1 T PO  QPM BEFORE BEDTIME 05/03/15   Historical Provider, MD  tiZANidine (ZANAFLEX) 4 MG tablet Take 4 mg by mouth 2 (two) times daily as needed for muscle spasms.    Historical Provider, MD  traMADol (ULTRAM) 50 MG tablet TAKE 1 TABLET BY MOUTH TWICE DAILY PRN FOR PAIN 10/13/15   Historical Provider, MD  valACYclovir (VALTREX) 500 MG tablet take 1 tablet by motuh twice per day 02/15/15   Historical Provider, MD    Family History Family History  Problem Relation Age of Onset  . Emphysema Father   . Dementia Mother   . Diabetes Maternal Grandmother   . Diabetes Maternal Aunt   . Diabetes  mat. cousin  . Heart disease Brother   . Colon cancer Neg Hx     Social History Social History  Substance Use Topics  . Smoking status: Current Every Day Smoker    Packs/day: 1.50    Years: 21.00    Types: Cigarettes  . Smokeless tobacco: Never Used     Comment: tobaccco info given 03/06/12, 1.5 PPD 05/15/15  . Alcohol use No     Allergies   Bee venom; Codeine; Ibuprofen; Propoxyphene n-acetaminophen; Toradol [ketorolac tromethamine]; Hydrocodone-acetaminophen; and Latex   Review of Systems Review of Systems A complete 10 system review of systems was obtained and all systems are negative except as noted in the HPI and PMH.   Physical Exam Updated Vital Signs BP 100/65 (BP Location: Right Arm)   Pulse 90   Temp 98.5 F (36.9 C)   Resp 18   SpO2 98%   Physical Exam  Constitutional: She is oriented to person, place, and time. She appears well-developed and well-nourished. No distress.  HENT:  Head: Normocephalic and atraumatic.  Mouth/Throat: Oropharynx is clear and moist.  No abrasions or contusions.   No hemotympanum, battle signs or raccoon's eyes  No crepitance or tenderness to palpation along the orbital rim.  EOMI intact with no pain or diplopia  No abnormal otorrhea or rhinorrhea. Nasal septum midline.  No intraoral trauma.     Eyes: Conjunctivae and  EOM are normal. Pupils are equal, round, and reactive to light.  Neck: Normal range of motion.  No midline C-spine  tenderness to palpation or step-offs appreciated. Patient has full range of motion without pain.  Grip strength, biceps, triceps 5/5 bilaterally;  can differentiate between pinprick and light touch bilaterally.   Cardiovascular: Normal rate, regular rhythm and intact distal pulses.   Pulmonary/Chest: Effort normal and breath sounds normal.  Abdominal: Soft. There is no tenderness.  Musculoskeletal: Normal range of motion. She exhibits edema and tenderness. She exhibits no deformity.  Edema and tenderness to right lateral malleolus on the inferior aspect, distally neurovascularly intact. Injuries closed.  Left knee with partial thickness abrasion approximately 1 cm, diffusely exquisite tender to light touch. No gross instability in anterior and posterior drawer, no focal bony tenderness to palpation and no focal medial or lateral joint line tenderness.  Neurological: She is alert and oriented to person, place, and time.  Follows commands, Clear, goal oriented speech, Strength is 5 out of 5x4 extremities. Sensation is grossly intact.   Skin: She is not diaphoretic.  Psychiatric: She has a normal mood and affect.  Nursing note and vitals reviewed.    ED Treatments / Results  DIAGNOSTIC STUDIES: Oxygen Saturation is 98% on RA, normal by my interpretation.    COORDINATION OF CARE: 3:06 PM- Pt advised of plan for treatment, which includes radiology, and pt agrees.  Labs (all labs ordered are listed, but only abnormal results are displayed) Labs Reviewed - No data to display  EKG  EKG Interpretation None       Radiology Dg Ankle Complete Right  Result Date: 05/10/2016 CLINICAL DATA:  Fall.  Rolling injury of right ankle. EXAM: RIGHT ANKLE - COMPLETE 3+ VIEW COMPARISON:  Right ankle radiograph 01/05/2006. FINDINGS: Small, crescent-shaped osseous fragment at the distal  aspect of the right lateral malleolus, likely minimally displaced avulsion fracture fragment. No other evidence of fracture identified. The ankle mortise is approximated. Moderate soft tissue swelling overlying the lateral malleolus. IMPRESSION: Minimally displaced avulsion fracture at the distal aspect of the right  fibula with overlying lateral malleolus soft tissue swelling. Electronically Signed   By: Ulyses Jarred M.D.   On: 05/10/2016 15:57   Dg Knee Complete 4 Views Left  Result Date: 05/10/2016 CLINICAL DATA:  Fall on left knee EXAM: LEFT KNEE - COMPLETE 4+ VIEW COMPARISON:  Left knee radiograph 12/07/2015, 03/11/2015 FINDINGS: There is prepatellar soft tissue swelling. No sizable knee joint effusion. There is mild degenerative spurring of the femorotibial joint spaces, which are otherwise preserved. No acute fracture is identified. IMPRESSION: 1. No acute fracture or dislocation at the left knee. 2. Prepatellar soft tissue swelling without sizable joint effusion. Electronically Signed   By: Ulyses Jarred M.D.   On: 05/10/2016 16:03    Procedures Procedures (including critical care time)  Medications Ordered in ED Medications  Tdap (BOOSTRIX) injection 0.5 mL (0.5 mLs Intramuscular Given 05/10/16 1549)  oxyCODONE-acetaminophen (PERCOCET/ROXICET) 5-325 MG per tablet 1 tablet (1 tablet Oral Given 05/10/16 1550)     Initial Impression / Assessment and Plan / ED Course  I have reviewed the triage vital signs and the nursing notes.  Pertinent labs & imaging results that were available during my care of the patient were reviewed by me and considered in my medical decision making (see chart for details).  Clinical Course      Final Clinical Impressions(s) / ED Diagnoses   Final diagnoses:  Avulsion fracture of lateral malleolus of right fibula, closed, initial encounter  Fall, initial encounter  Left knee sprain, initial encounter  Abrasion    New Prescriptions New Prescriptions    OXYCODONE-ACETAMINOPHEN (PERCOCET/ROXICET) 5-325 MG TABLET    Take 1 tablet by mouth every 4 (four) hours as needed for severe pain. May take 2 tablets PO q 6 hours for severe pain - Do not take with Tylenol as this tablet already contains tylenol   Vitals:   05/10/16 1432  BP: 100/65  Pulse: 90  Resp: 18  Temp: 98.5 F (36.9 C)  SpO2: 98%    Medications  Tdap (BOOSTRIX) injection 0.5 mL (0.5 mLs Intramuscular Given 05/10/16 1549)  oxyCODONE-acetaminophen (PERCOCET/ROXICET) 5-325 MG per tablet 1 tablet (1 tablet Oral Given 05/10/16 1550)    ELAYNAH ARING is 60 y.o. female presenting with Right ankle and left knee pain status post mechanical fall, she had facial trauma she's not anticoagulated, neurologic exam is nonfocal, there is no objective signs of trauma to the head, no neuro changes indicated at this time. X-ray of knee is negative she has a displaced avulsion fracture of the right fibula. Offered patient crutches but she declines these, patient will be placed in a cam walker and advised to follow with orthopedics.  Evaluation does not show pathology that would require ongoing emergent intervention or inpatient treatment. Pt is hemodynamically stable and mentating appropriately. Discussed findings and plan with patient/guardian, who agrees with care plan. All questions answered. Return precautions discussed and outpatient follow up given.    I personally performed the services described in this documentation, which was scribed in my presence. The recorded information has been reviewed and is accurate.    Monico Blitz, PA-C 05/10/16 [place], DO 05/11/16 1541

## 2016-05-14 DIAGNOSIS — J3089 Other allergic rhinitis: Secondary | ICD-10-CM | POA: Diagnosis not present

## 2016-05-15 DIAGNOSIS — J3081 Allergic rhinitis due to animal (cat) (dog) hair and dander: Secondary | ICD-10-CM | POA: Diagnosis not present

## 2016-05-17 DIAGNOSIS — S82899A Other fracture of unspecified lower leg, initial encounter for closed fracture: Secondary | ICD-10-CM

## 2016-05-17 HISTORY — DX: Other fracture of unspecified lower leg, initial encounter for closed fracture: S82.899A

## 2016-05-23 ENCOUNTER — Encounter (INDEPENDENT_AMBULATORY_CARE_PROVIDER_SITE_OTHER): Payer: Self-pay

## 2016-05-23 ENCOUNTER — Ambulatory Visit (INDEPENDENT_AMBULATORY_CARE_PROVIDER_SITE_OTHER): Payer: Medicare Other | Admitting: Gastroenterology

## 2016-05-23 ENCOUNTER — Ambulatory Visit: Payer: Self-pay

## 2016-05-23 ENCOUNTER — Encounter: Payer: Self-pay | Admitting: Gastroenterology

## 2016-05-23 VITALS — BP 108/70 | HR 76 | Ht 63.25 in | Wt 132.5 lb

## 2016-05-23 DIAGNOSIS — F17200 Nicotine dependence, unspecified, uncomplicated: Secondary | ICD-10-CM

## 2016-05-23 DIAGNOSIS — R1310 Dysphagia, unspecified: Secondary | ICD-10-CM | POA: Diagnosis not present

## 2016-05-23 DIAGNOSIS — R05 Cough: Secondary | ICD-10-CM | POA: Diagnosis not present

## 2016-05-23 DIAGNOSIS — K219 Gastro-esophageal reflux disease without esophagitis: Secondary | ICD-10-CM

## 2016-05-23 DIAGNOSIS — R059 Cough, unspecified: Secondary | ICD-10-CM

## 2016-05-23 DIAGNOSIS — Z720 Tobacco use: Secondary | ICD-10-CM | POA: Diagnosis not present

## 2016-05-23 MED ORDER — SUCRALFATE 1 GM/10ML PO SUSP: 1.0000 g | Freq: Four times a day (QID) | ORAL | 1 refills | Status: DC

## 2016-05-23 NOTE — Patient Instructions (Signed)
If you are age 60 or older, your body mass index should be between 23-30. Your Body mass index is 23.29 kg/m. If this is out of the aforementioned range listed, please consider follow up with your Primary Care Provider.  If you are age 31 or younger, your body mass index should be between 19-25. Your Body mass index is 23.29 kg/m. If this is out of the aformentioned range listed, please consider follow up with your Primary Care Provider.   You have been scheduled for an endoscopy. Please follow written instructions given to you at your visit today. If you use inhalers (even only as needed), please bring them with you on the day of your procedure. Your physician has requested that you go to www.startemmi.com and enter the access code given to you at your visit today. This web site gives a general overview about your procedure. However, you should still follow specific instructions given to you by our office regarding your preparation for the procedure.  We have sent the following medications to your pharmacy for you to pick up at your convenience: Carafate (liquid)

## 2016-05-23 NOTE — Progress Notes (Signed)
[facility] Consult Note:  History: Lori [name] 05/23/2016  Referring physician: No PCP Per Patient  Reason for consult/chief complaint: Gastroesophageal Reflux (indigestion and heartburn, regurgitation) and Cough (for 3 months)   Subjective  HPI:  Rosena sees me for the first time, she previously saw Dr. Sharlett Iles for chronic abdominal pain and GERD symptoms. I reviewed his last note from January 2014. At that time she had had an extensive workup for chronic abdominal pain and had a diffuse chronic pain syndrome on narcotic analgesics. She has had chronic symptoms of regurgitation and pyrosis. Limited describes worsening of heartburn over the last several months. This is despite being on Dexilant once a day. The symptoms seem worse at night or any time she lays down. She feels a lump in the throat and sometimes feels like it is difficult to swallow food or pills. Her last upper endoscopy in 2013 revealed a small hiatal hernia. There was no stricture, however a Maloney dilation was performed anyway. It is not clear if whether there was any relief of her dysphagia afterwards. 2 weeks ago she suffered an ankle fracture after a fall and was seen in the ED. She was taking Percocet regularly until she recently ran out of it.  ROS:  Review of Systems  Constitutional: Negative for appetite change and unexpected weight change.  HENT: Positive for congestion. Negative for mouth sores and voice change.   Eyes: Negative for pain and redness.  Respiratory: Positive for cough. Negative for shortness of breath.   Cardiovascular: Negative for chest pain and palpitations.  Genitourinary: Negative for dysuria and hematuria.  Musculoskeletal: Negative for arthralgias and myalgias.  Skin: Negative for pallor and rash.  Neurological: Negative for weakness and headaches.  Hematological: Negative for adenopathy.  Psychiatric/Behavioral: The patient is nervous/anxious.        States "my nerves  are all tore up".    Cniyah has had a chronic nonproductive cough the last few months, and is in the process of getting a new primary care physician. She also has chronic nasal congestion and postnasal drip  Past Medical History: Past Medical History:  Diagnosis Date  . Acute MI ([place])    x3  . Allergy   . Anxiety   . Asthma   . Barrett esophagus 2007  . Chest pain 05-02-2009   echo  EF 55%  . COPD (chronic obstructive pulmonary disease) with chronic bronchitis (HCC)    & Emphysema  . Depression   . Duodenitis without mention of hemorrhage 2007  . Esophageal reflux 2007  . Esophageal stricture   . Full dentures   . H/O cardiovascular stress test 05-02-2009   low risk scan, neg. for ischemia, no ekg changes  . H/O hiatal hernia   . Hiatal hernia [id number]  . Multiple fractures    from falls, fx rt. elbow, fx left wrist, bilateral ankles  . Ulcer      Past Surgical History: Past Surgical History:  Procedure Laterality Date  . ABDOMINAL HYSTERECTOMY    . CHOLECYSTECTOMY    . CHONDROPLASTY Left 01/06/2015   Procedure: CHONDROPLASTY;  Surgeon: Marybelle Killings, MD;  Location: [facility];  Service: Orthopedics;  Laterality: Left;  . COLONOSCOPY    . ELBOW FRACTURE SURGERY     right  . KNEE ARTHROSCOPY WITH EXCISION PLICA Left 0000000   Procedure: KNEE ARTHROSCOPY WITH EXCISION PLICA;  Surgeon: Marybelle Killings, MD;  Location: [facility];  Service: Orthopedics;  Laterality: Left;  .  Lower Extremity Arterial Dopplers  07/06/2013   RABI 1.0, LABI 1.1.; No evidence of significant vascular atherogenic plaque  . NECK SURGERY    . NM MYOVIEW LTD  07/02/2013   Normal LV function, EF 83%. (Likely overestimated due to low volume);; no evidence of ischemia or infarction. Normal wall motion.  . TONSILLECTOMY    . WRIST FRACTURE SURGERY     left, has plate     Family History: Family History  Problem Relation Age of Onset  . Emphysema Father   . Dementia  Mother   . Diabetes Maternal Grandmother   . Diabetes Maternal Aunt   . Diabetes      mat. cousin  . Heart disease Brother   . Colon cancer Neg Hx     Social History: Social History   Social History  . Marital status: Married    Spouse name: [name]  . Number of children: 2  . Years of education: HS   Occupational History  . disable    Social History Main Topics  . Smoking status: Current Every Day Smoker    Packs/day: 1.50    Years: 21.00    Types: Cigarettes  . Smokeless tobacco: Never Used     Comment: tobaccco info given 03/06/12, 1.5 PPD 05/15/15  . Alcohol use No  . Drug use: No  . Sexual activity: Not Asked   Other Topics Concern  . None   Social History Narrative   Patient lives at home spouse [name].  Has 2 daughters (29 & 3).   Currently "disabled".   Caffeine Use: tea 4 glasses daily.    Smokes ~1 1/2 PPD  05/15/15   Walks everyday - no set routine.    Allergies: Allergies  Allergen Reactions  . Bee Venom Swelling  . Codeine Nausea Only    CAUSES ULCERS  . Ibuprofen Nausea And Vomiting  . Propoxyphene N-Acetaminophen Nausea Only  . Toradol [Ketorolac Tromethamine] Nausea And Vomiting  . Hydrocodone-Acetaminophen Nausea Only    REACTION: "sick"  . Latex Rash    Outpatient Meds: Current Outpatient Prescriptions  Medication Sig Dispense Refill  . acetaminophen (TYLENOL) 500 MG tablet Take 2 tablets (1,000 mg total) by mouth every 6 (six) hours as needed. 30 tablet 0  . ALPRAZolam (XANAX) 1 MG tablet Take 1 tablet by mouth daily as needed for anxiety.   2  . aspirin 81 MG chewable tablet Chew 81 mg by mouth every morning.    . cyclobenzaprine (FLEXERIL) 10 MG tablet Take 1 tablet (10 mg total) by mouth 2 (two) times daily as needed for muscle spasms. 20 tablet 0  . DEXILANT 60 MG capsule TAKE 1 CAPSULE BY MOUTH ONCE DAILY  4  . Fluticasone Furoate-Vilanterol (BREO ELLIPTA) 100-25 MCG/INH AEPB Inhale into the lungs 2 (two) times daily.    Marland Kitchen  loperamide (IMODIUM) 2 MG capsule Take 1 capsule (2 mg total) by mouth 4 (four) times daily as needed for diarrhea or loose stools. 12 capsule 0  . meclizine (ANTIVERT) 25 MG tablet Take 1 tablet by mouth 3 (three) times daily.   2  . montelukast (SINGULAIR) 10 MG tablet Take 10 mg by mouth daily.     . ondansetron (ZOFRAN) 4 MG tablet Take 1 tablet (4 mg total) by mouth every 6 (six) hours. 12 tablet 0  . orphenadrine (NORFLEX) 100 MG tablet Take 1 tablet (100 mg total) by mouth 2 (two) times daily. 30 tablet 0  . simvastatin (ZOCOR) 20 MG  tablet TK 1 T PO  QPM BEFORE BEDTIME  3  . tiZANidine (ZANAFLEX) 4 MG tablet Take 4 mg by mouth 2 (two) times daily as needed for muscle spasms.    . valACYclovir (VALTREX) 500 MG tablet take 1 tablet by motuh twice per day  5  . nitroGLYCERIN (NITROSTAT) 0.4 MG SL tablet Place 1 tablet (0.4 mg total) under the tongue every 5 (five) minutes as needed for chest pain. (Patient not taking: Reported on 05/23/2016) 25 tablet 3  . oxyCODONE-acetaminophen (PERCOCET/ROXICET) 5-325 MG tablet Take 1 tablet by mouth every 4 (four) hours as needed for severe pain. May take 2 tablets PO q 6 hours for severe pain - Do not take with Tylenol as this tablet already contains tylenol (Patient not taking: Reported on 05/23/2016) 15 tablet 0  . sucralfate (CARAFATE) 1 GM/10ML suspension Take 10 mLs (1 g total) by mouth 4 (four) times daily. 420 mL 1  . traMADol (ULTRAM) 50 MG tablet TAKE 1 TABLET BY MOUTH TWICE DAILY PRN FOR PAIN  0   No current facility-administered medications for this visit.       ___________________________________________________________________ Objective   Exam:  BP 108/70 (BP Location: Left Arm, Patient Position: Sitting, Cuff Size: Normal)   Pulse 76   Ht 5' 3.25" (1.607 m)   Wt 132 lb 8 oz (60.1 kg)   BMI 23.29 kg/m  Her husband was present for the entire encounter  General: this is a(n) Chronically ill-appearing woman who looks older than stated  age   Eyes: sclera anicteric, no redness  ENT: oral mucosa moist without lesions, no cervical or supraclavicular lymphadenopathy, good dentition. Her voice has a gravelly quality  CV: RRR without murmur, S1/S2, no JVD, no peripheral edema  Resp: clear to auscultation bilaterally, normal RR and effort noted  GI: soft, no tenderness, with active bowel sounds. No guarding or palpable organomegaly noted.  Skin; warm and dry, no rash or jaundice noted  Neuro: awake, alert and oriented x 3. Normal gross motor function and fluent speech   Radiologic Studies:  Delayed gastric emptying 2010 ( ? Due to narcotics)  Assessment: Encounter Diagnoses  Name Primary?  . Gastroesophageal reflux disease, esophagitis presence not specified Yes  . Dysphagia   . Smoking   . Cough     She most likely has chronic reflux, but I think it is exacerbated by continued tobacco abuse, some functional overlay, and perhaps delayed gastric emptying from chronic use of narcotic analgesics. It is difficult to tell if her dysphagia is mechanical or motility. She also is a chronic smoker, so neoplasia must be ruled out.  Plan:  Upper endoscopy with possible dilation  The benefits and risks of the planned procedure were described in detail with the patient or (when appropriate) their health care proxy.  Risks were outlined as including, but not limited to, bleeding, infection, perforation, adverse medication reaction leading to cardiac or pulmonary decompensation, or pancreatitis (if ERCP).  The limitation of incomplete mucosal visualization was also discussed.  No guarantees or warranties were given.  Carafate was added, and she is already on a potent PPI. We discussed the limitation of acid suppression because of nonacid reflux and sometimes visceral hypersensitivity.  She simply must make efforts to quit smoking for a variety of health reasons including her reflux.  She needs to address her chronic cough and  COPD with her new primary care physician.  Thank you for the courtesy of this consult.  Please call me  with any questions or concerns.  Nelida Meuse III  CC: No PCP Per Patient

## 2016-05-29 ENCOUNTER — Ambulatory Visit (INDEPENDENT_AMBULATORY_CARE_PROVIDER_SITE_OTHER): Payer: Medicare Other | Admitting: *Deleted

## 2016-05-29 DIAGNOSIS — J309 Allergic rhinitis, unspecified: Secondary | ICD-10-CM

## 2016-06-05 ENCOUNTER — Ambulatory Visit (INDEPENDENT_AMBULATORY_CARE_PROVIDER_SITE_OTHER): Payer: Medicare Other

## 2016-06-05 DIAGNOSIS — J309 Allergic rhinitis, unspecified: Secondary | ICD-10-CM | POA: Diagnosis not present

## 2016-06-10 ENCOUNTER — Telehealth: Payer: Self-pay | Admitting: Gastroenterology

## 2016-06-10 ENCOUNTER — Encounter: Payer: Self-pay | Admitting: Gastroenterology

## 2016-06-10 ENCOUNTER — Ambulatory Visit (AMBULATORY_SURGERY_CENTER): Payer: Medicare Other | Admitting: Gastroenterology

## 2016-06-10 ENCOUNTER — Other Ambulatory Visit: Payer: Self-pay

## 2016-06-10 VITALS — BP 125/67 | HR 73 | Temp 98.4°F | Resp 21 | Ht 63.25 in | Wt 132.0 lb

## 2016-06-10 DIAGNOSIS — R1310 Dysphagia, unspecified: Secondary | ICD-10-CM

## 2016-06-10 DIAGNOSIS — K219 Gastro-esophageal reflux disease without esophagitis: Secondary | ICD-10-CM

## 2016-06-10 DIAGNOSIS — K222 Esophageal obstruction: Secondary | ICD-10-CM

## 2016-06-10 MED ORDER — SODIUM CHLORIDE 0.9 % IV SOLN: 500.0000 mL | INTRAVENOUS | Status: DC

## 2016-06-10 MED ORDER — ONDANSETRON HCL 4 MG PO TABS: 4.0000 mg | ORAL_TABLET | Freq: Four times a day (QID) | ORAL | 2 refills | Status: DC

## 2016-06-10 NOTE — Telephone Encounter (Signed)
Script sent to pharmacy and pt aware. 

## 2016-06-10 NOTE — Telephone Encounter (Signed)
Pt states that she was given hot tea in the [facility] after her EGD. States she vomited this up once she got home. Pt has tried drinking water and she did not keep this down either. Pt states she cannot keep anything on her stomach. Please advise.

## 2016-06-10 NOTE — Op Note (Signed)
Lori [name] Patient Name: Lori [name] Procedure Date: 06/10/2016 8:39 AM MRN: [id number] Endoscopist: Mallie Mussel L. Loletha Carrow , MD Age: 60 Referring MD:  Date of Birth: 03-Jul-1956 Gender: Female Account #: [id number] Procedure:                Upper GI endoscopy Indications:              Epigastric abdominal pain, Dysphagia, Heartburn,                            Esophageal reflux Medicines:                Monitored Anesthesia Care Procedure:                Pre-Anesthesia Assessment:                           - Prior to the procedure, a History and Physical                            was performed, and patient medications and                            allergies were reviewed. The patient's tolerance of                            previous anesthesia was also reviewed. The risks                            and benefits of the procedure and the sedation                            options and risks were discussed with the patient.                            All questions were answered, and informed consent                            was obtained. Anticoagulants: The patient has taken                            aspirin. It was decided not to withhold this                            medication prior to the procedure. ASA Grade                            Assessment: III - A patient with severe systemic                            disease. After reviewing the risks and benefits,                            the patient was deemed in satisfactory condition to  undergo the procedure.                           After obtaining informed consent, the endoscope was                            passed under direct vision. Throughout the                            procedure, the patient's blood pressure, pulse, and                            oxygen saturations were monitored continuously. The                            Model GIF-HQ190 [id number]) scope was introduced                    through the mouth, and advanced to the second part                            of duodenum. The upper GI endoscopy was                            accomplished without difficulty. The patient                            tolerated the procedure. Scope In: Scope Out: Findings:                 The larynx was normal.                           LA Grade A reflux esophagitis was found.                           A widely patent Schatzki ring was found at the                            lower esophageal sphincter.                           A small hiatal hernia was present.                           The stomach was normal.                           The cardia and gastric fundus were normal on                            retroflexion.                           The examined duodenum was normal. Complications:            No immediate complications. Estimated Blood Loss:     Estimated blood loss: none. Impression:               -  Normal larynx.                           - LA Grade A reflux esophagitis.                           - Widely patent Schatzki ring.                           - Small hiatal hernia.                           - Normal stomach.                           - Normal examined duodenum.                           - No specimens collected.                           The dysphagia appears likely due to GERD-related                            dysmotility.                           No cause of chronic epigastric pain was seen, and                            the patient had an extensive workup for this years                            ago. It appears related to a chronic pain syndrome.                           Smoking cessation is the most important thing for                            this patient to do in order to reduce GERD symptoms. Recommendation:           - Patient has a contact number available for                            emergencies. The signs and symptoms of  potential                            delayed complications were discussed with the                            patient. Return to normal activities tomorrow.                            Written discharge instructions were provided to the  patient.                           - Resume previous diet.                           - Continue present medications.                           - Discontinue the use of any products containing                            nicotine indefinitely.                           - Return to primary care physician. Lori [name] L. Loletha Carrow, MD 06/10/2016 10:08:40 AM This report has been signed electronically.

## 2016-06-10 NOTE — Telephone Encounter (Signed)
Pls send a new rx for ondansetron (generic zofran) 4 mg tablet.   Sig: one tablet every 6 hours as needed for nausea or vomiting. Disp: #30, RF 2

## 2016-06-10 NOTE — Progress Notes (Signed)
Once pt sat in wheel chair in recovery to go home pt started c/o abd pain, that was getting worse, advised Dr Loletha Carrow, Dr believes it is just air trapped in abd, did not due biopsies or dilate, advised pt to drink warm liquids to help with air,pt had s/s of complications and states understanding, pt discharged home-adm

## 2016-06-10 NOTE — Progress Notes (Signed)
A/ox3, pleased with MAC, report to RN 

## 2016-06-10 NOTE — Patient Instructions (Signed)
YOU HAD AN ENDOSCOPIC PROCEDURE TODAY AT [facility]:   Refer to the procedure report that was given to you for any specific questions about what was found during the examination.  If the procedure report does not answer your questions, please call your gastroenterologist to clarify.  If you requested that your care partner not be given the details of your procedure findings, then the procedure report has been included in a sealed envelope for you to review at your convenience later.  YOU SHOULD EXPECT: Some feelings of bloating in the abdomen. Passage of more gas than usual.  Walking can help get rid of the air that was put into your GI tract during the procedure and reduce the bloating. If you had a lower endoscopy (such as a colonoscopy or flexible sigmoidoscopy) you may notice spotting of blood in your stool or on the toilet paper. If you underwent a bowel prep for your procedure, you may not have a normal bowel movement for a few days.  Please Note:  You might notice some irritation and congestion in your nose or some drainage.  This is from the oxygen used during your procedure.  There is no need for concern and it should clear up in a day or so.  SYMPTOMS TO REPORT IMMEDIATELY:    Following upper endoscopy (EGD)  Vomiting of blood or coffee ground material  New chest pain or pain under the shoulder blades  Painful or persistently difficult swallowing  New shortness of breath  Fever of 100F or higher  Black, tarry-looking stools  For urgent or emergent issues, a gastroenterologist can be reached at any hour by calling [phone].   DIET:  We do recommend a small meal at first, but then you may proceed to your regular diet.  Drink plenty of fluids but you should avoid alcoholic beverages for 24 hours.  ACTIVITY:  You should plan to take it easy for the rest of today and you should NOT DRIVE or use heavy machinery until tomorrow (because of the sedation medicines used  during the test).    FOLLOW UP: Our staff will call the number listed on your records the next business day following your procedure to check on you and address any questions or concerns that you may have regarding the information given to you following your procedure. If we do not reach you, we will leave a message.  However, if you are feeling well and you are not experiencing any problems, there is no need to return our call.  We will assume that you have returned to your regular daily activities without incident.  If any biopsies were taken you will be contacted by phone or by letter within the next 1-3 weeks.  Please call us at [phone] if you have not heard about the biopsies in 3 weeks.    SIGNATURES/CONFIDENTIALITY: You and/or your care partner have signed paperwork which will be entered into your electronic medical record.  These signatures attest to the fact that that the information above on your After Visit Summary has been reviewed and is understood.  Full responsibility of the confidentiality of this discharge information lies with you and/or your care-partner.  Esophagitis, GERD, Hiatal hernia-handouts given  Smoking Cessation.  Return to primary care.

## 2016-06-11 ENCOUNTER — Telehealth: Payer: Self-pay

## 2016-06-11 NOTE — Telephone Encounter (Signed)
  Follow up Call-  Call back number 06/10/2016  Post procedure Call Back phone  # [phone]  Some recent data might be hidden    Patient states that she does not have Esomeprazole Magnesium 20 mg. Patient states she had heartburn last night. It looks like she may have a refill at the pharmacy. I told the patient to call the pharmacy and check to see if there is a refill. If she doesn't have a refill I instructed the patient to call Dr. Corena Pilgrim nurse to see if he would call her medication into her pharmacy.    Patient questions:  Do you have a fever, pain , or abdominal swelling? No. Pain Score  0 *  Have you tolerated food without any problems? Yes.    Have you been able to return to your normal activities? Yes.    Do you have any questions about your discharge instructions: Diet   No. Medications  No. Follow up visit  No.  Do you have questions or concerns about your Care? No.  Actions: * If pain score is 4 or above: No action needed, pain <4.

## 2016-06-12 ENCOUNTER — Ambulatory Visit (INDEPENDENT_AMBULATORY_CARE_PROVIDER_SITE_OTHER): Payer: Medicare Other | Admitting: *Deleted

## 2016-06-12 DIAGNOSIS — J309 Allergic rhinitis, unspecified: Secondary | ICD-10-CM

## 2016-06-19 ENCOUNTER — Encounter: Payer: Self-pay | Admitting: Allergy & Immunology

## 2016-06-19 ENCOUNTER — Ambulatory Visit: Payer: Self-pay

## 2016-06-19 ENCOUNTER — Ambulatory Visit (INDEPENDENT_AMBULATORY_CARE_PROVIDER_SITE_OTHER): Payer: Medicare Other | Admitting: Allergy & Immunology

## 2016-06-19 VITALS — BP 116/78 | HR 96 | Temp 98.0°F | Resp 18 | Ht 63.0 in | Wt 130.2 lb

## 2016-06-19 DIAGNOSIS — J019 Acute sinusitis, unspecified: Secondary | ICD-10-CM

## 2016-06-19 DIAGNOSIS — J309 Allergic rhinitis, unspecified: Secondary | ICD-10-CM

## 2016-06-19 DIAGNOSIS — Z9119 Patient's noncompliance with other medical treatment and regimen: Secondary | ICD-10-CM | POA: Insufficient documentation

## 2016-06-19 DIAGNOSIS — J310 Chronic rhinitis: Secondary | ICD-10-CM

## 2016-06-19 DIAGNOSIS — J4541 Moderate persistent asthma with (acute) exacerbation: Secondary | ICD-10-CM

## 2016-06-19 DIAGNOSIS — Z91199 Patient's noncompliance with other medical treatment and regimen due to unspecified reason: Secondary | ICD-10-CM

## 2016-06-19 MED ORDER — IPRATROPIUM-ALBUTEROL 0.5-2.5 (3) MG/3ML IN SOLN: 3.0000 mL | Freq: Once | RESPIRATORY_TRACT | Status: DC

## 2016-06-19 MED ORDER — FLUTICASONE FUROATE-VILANTEROL 200-25 MCG/INH IN AEPB: 1.0000 | INHALATION_SPRAY | Freq: Every day | RESPIRATORY_TRACT | 6 refills | Status: AC

## 2016-06-19 MED ORDER — AMOXICILLIN-POT CLAVULANATE 875-125 MG PO TABS: 1.0000 | ORAL_TABLET | Freq: Two times a day (BID) | ORAL | 0 refills | Status: AC

## 2016-06-19 MED ORDER — ALBUTEROL SULFATE 108 (90 BASE) MCG/ACT IN AEPB: 2.0000 | INHALATION_SPRAY | Freq: Four times a day (QID) | RESPIRATORY_TRACT | 3 refills | Status: DC | PRN

## 2016-06-19 NOTE — Patient Instructions (Addendum)
1. Moderate persistent asthma, with acute exacerbation - Start controller medication: Breo 200/20 one puff daily.  - Use ProAir Respliclick four puffs every 4-6 hours as needed. - Start prednisone pack given today in clinic.  - It is important that you stop smoking or limit it at the very least.   2. Acute sinusitis, recurrence not specified, unspecified location - Start Augmentin 875mg  one tablet twice daily for 14 days. - Use nasal saline rinses twice daily.  3. Other chronic rhinitis - Use Flonase two sprays per nostril daily. - Continue allergy shots at our clinic.   4. Return in about 2 months (around 08/19/2016).  Please inform us of any Emergency Department visits, hospitalizations, or changes in symptoms. Call us before going to the ED for breathing or allergy symptoms since we might be able to fit you in for a sick visit. Feel free to contact us anytime with any questions, problems, or concerns.  It was a pleasure to meet you and your family today!   Websites that have reliable patient information: 1. American Academy of Asthma, Allergy, and Immunology: www.aaaai.org 2. Food Allergy Research and Education (FARE): foodallergy.org 3. Mothers of Asthmatics: http://www.asthmacommunitynetwork.org 4. American College of Allergy, Asthma, and Immunology: www.acaai.org

## 2016-06-19 NOTE — Progress Notes (Signed)
FOLLOW UP  Date of Service/Encounter:  06/19/16   Assessment:   Moderate persistent asthma with acute exacerbation  Acute sinusitis, recurrence not specified, unspecified location  Other chronic rhinitis   Asthma Reportables:  Severity: moderate persistent  Risk: high Control: not well controlled  Seasonal Influenza Vaccine: no but encouraged    Plan/Recommendations:   1. Moderate persistent asthma, with acute exacerbation - We did obtain a pharmacy refill history, which made the picture even more confusing. - We will just start from scratch with a focus on making her regimen easier.  - Start controller medication: Breo 200/25 one puff daily.  - Use ProAir Respliclick four puffs every 4-6 hours as needed. - Start prednisone pack given today in clinic.  - I did emphasize the importance of smoking cessation. - I discussed the adverse effects of cigarette smoking, including ciliary dysfunction which can lead to a predisposition to sinopulmonary infections.  2. Acute sinusitis, recurrence not specified, unspecified location - Start Augmentin 875mg  one tablet twice daily for 14 days. - Use nasal saline rinses twice daily. - Recurrent rhinosinusitis is likely related to her smoking and ciliary dysfunction.   3. Other chronic rhinitis - Use Flonase two sprays per nostril daily. - Continue allergy shots at our clinic.  - She should not receive any allergy shots at her primary care physician's office given the events that transpired in the spring.  4. Return in about 2 months (around 08/19/2016).   Subjective:   Lori [name] is a 60 y.o. female presenting today for follow up of  Chief Complaint  Patient presents with  . Asthma  . Allergic Rhinitis   .  Lori [name] has a history of the following: Patient Active Problem List   Diagnosis Date Noted  . Gait difficulty 06/21/2015  . Fibromyalgia 06/21/2015  . Conversion reaction 06/21/2015  . Depression  06/21/2015  . Syncope and collapse 06/21/2015  . Plica syndrome of left knee 01/06/2015  . Bronchitis 09/06/2014  . Anxiety 09/06/2014  . History of MI 09/06/2014  . Costochondritis 07/29/2013  . Chest pain with low risk of acute coronary syndrome 06/27/2013  . Leg fatigue 06/27/2013  . Smoking 06/27/2013  . Borderline hypertension 06/27/2013  . Low back pain 06/14/2013  . Leg pain, bilateral 06/14/2013  . GASTROPARESIS 10/26/2009  . Chest pain 05/02/2009  . HYPERLIPIDEMIA 11/18/2008  . ASTHMA, UNSPECIFIED, UNSPECIFIED STATUS 11/18/2008  . COPD 11/18/2008  . HEPATIC CYST 11/18/2008  . NAUSEA AND VOMITING 11/18/2008    History obtained from: chart review and patient, patient's husband, and a friend.  Lori [name] was referred by Lori Lacy, Lori [name].     Lori [name] is a 60 y.o. female presenting for a follow up visit for asthma and allergies. The entire history is rather difficult to get since none of the people here with Lori [name] can provide a clear history. Lori [name] was last seen in February 2016 by Dr. Neldon Mc. At that time, she was doing very well. She has continued her immunotherapy does seem to help with her seasonal allergies. She continued on Singulair. She was not using Asmanex, Pro Air, or nasal steroids.   Since the last visit, she reports that she has been "having a lot of asthma attacks". The history is rather vague and all over the place from all three people in the exam room. Apparently since August 2017, she has had non-stop coughing and wheezing that has not been responsive to albuterol. She is unable to elaborate on  what she is actually taking for her asthma. She reports that she started her "asthma pills" recently because the inhalers were not working. She is unable to tell me which inhalers she is/was on and as I list multiple inhalers she does not seem to recognize any of their names. In fact, she rarely gives a clear response to any of my questions. Of note, she does have a  history of smoking 1.5 packs per day for 21 years.  Lori [name] remains on allergy injection. From February 2017, she was given out of date allergy injections. She started coming back here in September and has been receiving all of her allergy injections at our clinic.  Review of pharmacy refill history ([facility]): Mometasone nasal spray: Oct 2016 Montelukast: Oct 2016, July 2017  Dulera: 200/5: Nov 2016 Loratadine: May 2016, December 2016 Prednisone: Jan 2017 ProAir: June 2016  Amox-clav: Feb 2017, March 2017 Amox: May 2016, March 2017 x2 Azithromycin: Aug 2016, Dec 2016 Cipro: Sept 2016  Otherwise, there have been no changes to the past medical history, surgical history, family history, or social history as far as I can tell.     Review of Systems: a 14-point review of systems is pertinent for what is mentioned in HPI.  Otherwise, all other systems were negative. Constitutional: negative other than that listed in the HPI Eyes: negative other than that listed in the HPI Ears, nose, mouth, throat, and face: negative other than that listed in the HPI Respiratory: negative other than that listed in the HPI Cardiovascular: negative other than that listed in the HPI Gastrointestinal: negative other than that listed in the HPI Genitourinary: negative other than that listed in the HPI Integument: negative other than that listed in the HPI Hematologic: negative other than that listed in the HPI Musculoskeletal: negative other than that listed in the HPI Neurological: negative other than that listed in the HPI Allergy/Immunologic: negative other than that listed in the HPI    Objective:   Blood pressure 116/78, pulse 96, temperature 98 F (36.7 C), temperature source Oral, resp. rate 18, height 5\' 3"  (1.6 m), weight 130 lb 3.2 oz (59.1 kg), SpO2 98 %. Body mass index is 23.06 kg/m.   Physical Exam:  General: Alert, interactive, in no acute distress. Very haggard appearing.  Flighty.2 HEENT: TMs pearly gray, turbinates edematous with thick discharge, post-pharynx erythematous. Bilateral maxillary sinus tenderness. Neck: Supple without thyromegaly. Lungs: Decreased breath sounds with expiratory wheezing bilaterally. No increased work of breathing. CV: Normal S1, S2 without murmurs. Capillary refill <2 seconds.  Abdomen: Nondistended, nontender. Skin: Warm and dry, without lesions or rashes. Extremities:  No clubbing, cyanosis or edema. Neuro:   Grossly intact.   Diagnostic studies:  Spirometry: results abnormal (FEV1: 2.35/54%, FVC: 2.90/61%, FEV1/FVC: 71%).    Spirometry consistent with possible restrictive disease. We did provide an albuterol nebulizer treatment in the clinic today with no improvement in her spirometry findings. In fact, the FVC and FEV1 both decreased. She did seem slightly more comfortable after the inhaler treatment.   Allergy Studies: None     Salvatore Marvel, MD Lori [name]

## 2016-06-21 ENCOUNTER — Ambulatory Visit: Payer: Medicare Other | Admitting: Nurse Practitioner

## 2016-06-21 ENCOUNTER — Encounter: Payer: Self-pay | Admitting: Nurse Practitioner

## 2016-06-21 ENCOUNTER — Other Ambulatory Visit (INDEPENDENT_AMBULATORY_CARE_PROVIDER_SITE_OTHER): Payer: Medicare Other

## 2016-06-21 ENCOUNTER — Ambulatory Visit (INDEPENDENT_AMBULATORY_CARE_PROVIDER_SITE_OTHER): Payer: Medicare Other | Admitting: Nurse Practitioner

## 2016-06-21 VITALS — BP 116/78 | HR 88 | Temp 98.0°F | Ht 63.0 in | Wt 129.0 lb

## 2016-06-21 DIAGNOSIS — Z0000 Encounter for general adult medical examination without abnormal findings: Secondary | ICD-10-CM

## 2016-06-21 DIAGNOSIS — F339 Major depressive disorder, recurrent, unspecified: Secondary | ICD-10-CM

## 2016-06-21 DIAGNOSIS — R350 Frequency of micturition: Secondary | ICD-10-CM

## 2016-06-21 DIAGNOSIS — F419 Anxiety disorder, unspecified: Secondary | ICD-10-CM

## 2016-06-21 DIAGNOSIS — G4700 Insomnia, unspecified: Secondary | ICD-10-CM

## 2016-06-21 DIAGNOSIS — M62830 Muscle spasm of back: Secondary | ICD-10-CM | POA: Diagnosis not present

## 2016-06-21 DIAGNOSIS — J4541 Moderate persistent asthma with (acute) exacerbation: Secondary | ICD-10-CM | POA: Diagnosis not present

## 2016-06-21 DIAGNOSIS — E782 Mixed hyperlipidemia: Secondary | ICD-10-CM

## 2016-06-21 DIAGNOSIS — S82892A Other fracture of left lower leg, initial encounter for closed fracture: Secondary | ICD-10-CM | POA: Insufficient documentation

## 2016-06-21 DIAGNOSIS — K2100 Gastro-esophageal reflux disease with esophagitis, without bleeding: Secondary | ICD-10-CM

## 2016-06-21 LAB — LIPID PANEL
CHOL/HDL RATIO: 8
Cholesterol: 345 mg/dL — ABNORMAL HIGH (ref 0–200)
HDL: 45.3 mg/dL (ref >39.00)
NonHDL: 299.71
Triglycerides: 395 mg/dL — ABNORMAL HIGH (ref 0.0–149.0)
VLDL: 79 mg/dL — AB (ref 0.0–40.0)

## 2016-06-21 LAB — POCT URINALYSIS DIPSTICK
BILIRUBIN UA: NEGATIVE
Blood, UA: NEGATIVE
GLUCOSE UA: NEGATIVE
KETONES UA: NEGATIVE
Leukocytes, UA: NEGATIVE
Nitrite, UA: NEGATIVE
Protein, UA: NEGATIVE
SPEC GRAV UA: 1.025
Urobilinogen, UA: 0.2
pH, UA: 6

## 2016-06-21 LAB — LDL CHOLESTEROL, DIRECT: LDL DIRECT: 268 mg/dL

## 2016-06-21 LAB — HEMOGLOBIN A1C: HEMOGLOBIN A1C: 5.5 % (ref 4.6–6.5)

## 2016-06-21 LAB — TSH: TSH: 1.74 u[IU]/mL (ref 0.35–4.50)

## 2016-06-21 MED ORDER — TRAZODONE HCL 50 MG PO TABS: 50.0000 mg | ORAL_TABLET | Freq: Every day | ORAL | 2 refills | Status: DC

## 2016-06-21 MED ORDER — CITALOPRAM HYDROBROMIDE 20 MG PO TABS: 20.0000 mg | ORAL_TABLET | Freq: Every day | ORAL | 2 refills | Status: DC

## 2016-06-21 MED ORDER — DEXILANT 60 MG PO CPDR: 1.0000 | DELAYED_RELEASE_CAPSULE | Freq: Every day | ORAL | 3 refills | Status: DC

## 2016-06-21 MED ORDER — CYCLOBENZAPRINE HCL 5 MG PO TABS: 5.0000 mg | ORAL_TABLET | Freq: Two times a day (BID) | ORAL | 0 refills | Status: DC | PRN

## 2016-06-21 NOTE — Progress Notes (Signed)
Pre visit review using our clinic review tool, if applicable. No additional management support is needed unless otherwise documented below in the visit note. 

## 2016-06-21 NOTE — Assessment & Plan Note (Signed)
Managed by GI 

## 2016-06-21 NOTE — Progress Notes (Signed)
Subjective:    Patient ID: Lori [name], female    DOB: 1956-01-27, 60 y.o.   MRN: [id number]  Patient presents today for complete physical or establish care (new patient) and medication refill.  Insomnia  Primary symptoms: fragmented sleep, no malaise/fatigue.  The current episode started more than one month. The onset quality is sudden. The problem occurs nightly. The problem is unchanged. The symptoms are aggravated by anxiety, caffeine, emotional upset, family issues, pain and tobacco. How many beverages per day that contain caffeine: 2-3.  Types of beverages you drink: coffee and soda. Nothing relieves the symptoms. Past treatments include medication (xanax). The treatment provided mild relief. Typical bedtime:  11-12 P.M..  How long after going to bed to you fall asleep: over an hour.   PMH includes: depression, family stress or anxiety, chronic pain.  Anxiety  Presents for initial visit. Symptoms include depressed mood, excessive worry, insomnia, irritability, muscle tension, nervous/anxious behavior, panic and restlessness. Patient reports no chest pain, confusion, dizziness, palpitations, shortness of breath or suicidal ideas. Symptoms occur constantly. The severity of symptoms is severe and interfering with daily activities. The symptoms are aggravated by family issues. The quality of sleep is poor. Nighttime awakenings: several, early a.m. for rest of night.   Risk factors include marital problems. Her past medical history is significant for anxiety/panic attacks, asthma, chronic lung disease, depression and fibromyalgia. There is no history of suicide attempts. Past treatments include benzodiazephines. The treatment provided no relief. Compliance with prior treatments has been poor. Prior compliance problems include medication issues and medical issues.   She denies any acute complaints today.  She uses Xanax for sleep and anxiety. She has never used any other antidepressant. She  denies any homicidal or suicidal ideation.  Used tramadol for recent ankle fracture, she was evaluated at Hospital , given a boot which she is not wearing, needs referral to orthopedic surgery.  Immunizations: (TDAP, Hep C screen, Pneumovax, Influenza, zoster)  Health Maintenance  Topic Date Due  .  Hepatitis C: One time screening is recommended by Center for Disease Control  (CDC) for  adults born from [birth year] through 1965.   08-06-1956  . HIV Screening  08/11/1971  . Colon Cancer Screening  06/16/2017*  . Mammogram  05/07/2017  . Tetanus Vaccine  05/10/2026  . Flu Shot  Completed  *Topic was postponed. The date shown is not the original due date.   Diet:none Weight:  Wt Readings from Last 3 Encounters:  06/21/16 129 lb (58.5 kg)  06/19/16 130 lb 3.2 oz (59.1 kg)  06/10/16 132 lb (59.9 kg)   Exercise:none Fall Risk: Fall Risk  06/21/2016 06/21/2015 06/21/2015 05/15/2015  Falls in the past year? Yes (No Data) Yes Yes  Number falls in past yr: 2 or more - - 2 or more  Injury with Fall? Yes (No Data) - Yes  Risk Factor Category  High Fall Risk - - -  Risk for fall due to : History of fall(s) - - -   Home Safety:home with husband Depression/Suicide:no homicidal or suicidal ideation Depression screen PHQ 2/9 06/21/2016  Decreased Interest 0  Down, Depressed, Hopeless 0  PHQ - 2 Score 0   No flowsheet data found. Colonoscopy (every 5-72yrs, >50-70yrs): She declined a repeat colonoscopy due to discomfort of prep and procedure. She is willing to try Cologaurd if approved by insurance Dexa (every 2-51yrs, >7yrs): Declined Pap Smear (every 29yrs for >21-29 without HPV, every 30yrs for >30-33yrs with HPV): Not candidate,  status post total hysterectomy with oophorectomy. Mammogram (yearly, >24yrs): Declined Vision: Needed Dental: Needed Advanced Directive: Advanced Directives 06/21/2016  Does patient have an advance directive? No  Would patient like information on creating an advanced  directive? No - patient declined information   Sexual History (birth control, marital status, STD): Married, not sexually active.  Medications and allergies reviewed with patient and updated if appropriate.  Patient Active Problem List   Diagnosis Date Noted  . Ankle fracture, left 06/21/2016  . Chronic rhinitis 06/19/2016  . Moderate persistent asthma with acute exacerbation 06/19/2016  . Compliance poor 06/19/2016  . Gait difficulty 06/21/2015  . Fibromyalgia 06/21/2015  . Conversion reaction 06/21/2015  . Depression 06/21/2015  . Syncope and collapse 06/21/2015  . Plica syndrome of left knee 01/06/2015  . Bronchitis 09/06/2014  . Anxiety 09/06/2014  . History of MI 09/06/2014  . Costochondritis 07/29/2013  . Chest pain with low risk of acute coronary syndrome 06/27/2013  . Leg fatigue 06/27/2013  . Smoking 06/27/2013  . Borderline hypertension 06/27/2013  . Low back pain 06/14/2013  . Leg pain, bilateral 06/14/2013  . GASTROPARESIS 10/26/2009  . Chest pain 05/02/2009  . Hyperlipidemia 11/18/2008  . ASTHMA, UNSPECIFIED, UNSPECIFIED STATUS 11/18/2008  . COPD 11/18/2008  . HEPATIC CYST 11/18/2008  . NAUSEA AND VOMITING 11/18/2008    Current Outpatient Prescriptions on File Prior to Visit  Medication Sig Dispense Refill  . acetaminophen (TYLENOL) 500 MG tablet Take 2 tablets (1,000 mg total) by mouth every 6 (six) hours as needed. 30 tablet 0  . Albuterol Sulfate (PROAIR RESPICLICK) 123XX123 (90 Base) MCG/ACT AEPB Inhale 2 puffs into the lungs every 6 (six) hours as needed. 1 each 3  . amoxicillin-clavulanate (AUGMENTIN) 875-125 MG tablet Take 1 tablet by mouth 2 (two) times daily. 28 tablet 0  . aspirin 81 MG chewable tablet Chew 81 mg by mouth every morning.    . fluticasone furoate-vilanterol (BREO ELLIPTA) 200-25 MCG/INH AEPB Inhale 1 puff into the lungs daily. 1 each 6  . meclizine (ANTIVERT) 25 MG tablet Take 1 tablet by mouth 3 (three) times daily.   2  . montelukast  (SINGULAIR) 10 MG tablet Take 10 mg by mouth daily.     . nitroGLYCERIN (NITROSTAT) 0.4 MG SL tablet Place 1 tablet (0.4 mg total) under the tongue every 5 (five) minutes as needed for chest pain. 25 tablet 3  . simvastatin (ZOCOR) 20 MG tablet TK 1 T PO  QPM BEFORE BEDTIME  3  . sucralfate (CARAFATE) 1 GM/10ML suspension Take 10 mLs (1 g total) by mouth 4 (four) times daily. 420 mL 1  . valACYclovir (VALTREX) 500 MG tablet take 1 tablet by motuh twice per day  5   No current facility-administered medications on file prior to visit.     Past Medical History:  Diagnosis Date  . Acute MI    x3  . Allergy   . Anemia   . Ankle fracture 05/2016  . Anxiety   . Arthritis   . Asthma   . Barrett esophagus 2007  . Chest pain 05-02-2009   echo  EF 55%  . COPD (chronic obstructive pulmonary disease) with chronic bronchitis (HCC)    & Emphysema  . Depression   . Duodenitis without mention of hemorrhage 2007  . Esophageal reflux 2007  . Esophageal stricture   . Full dentures   . H/O cardiovascular stress test 05-02-2009   low risk scan, neg. for ischemia, no ekg changes  . H/O  hiatal hernia   . Hiatal hernia [id number]  . Hyperlipidemia   . Multiple fractures    from falls, fx rt. elbow, fx left wrist, bilateral ankles  . Ulcer [facility])     Past Surgical History:  Procedure Laterality Date  . ABDOMINAL HYSTERECTOMY    . CHOLECYSTECTOMY    . CHONDROPLASTY Left 01/06/2015   Procedure: CHONDROPLASTY;  Surgeon: Marybelle Killings, MD;  Location: [facility];  Service: Orthopedics;  Laterality: Left;  . COLONOSCOPY    . ELBOW FRACTURE SURGERY     right  . KNEE ARTHROSCOPY WITH EXCISION PLICA Left [id number]   Procedure: KNEE ARTHROSCOPY WITH EXCISION PLICA;  Surgeon: Marybelle Killings, MD;  Location: [facility];  Service: Orthopedics;  Laterality: Left;  . Lower Extremity Arterial Dopplers  07/06/2013   RABI 1.0, LABI 1.1.; No evidence of significant vascular atherogenic  plaque  . NECK SURGERY    . NM MYOVIEW LTD  07/02/2013   Normal LV function, EF 83%. (Likely overestimated due to low volume);; no evidence of ischemia or infarction. Normal wall motion.  . TONSILLECTOMY    . WRIST FRACTURE SURGERY     left, has plate    Social History   Social History  . Marital status: Married    Spouse name: [name]  . Number of children: 2  . Years of education: HS   Occupational History  . disable    Social History Main Topics  . Smoking status: Current Every Day Smoker    Packs/day: 1.50    Years: 21.00    Types: Cigarettes  . Smokeless tobacco: Never Used     Comment: tobaccco info given 03/06/12, 1.5 PPD 05/15/15  . Alcohol use No  . Drug use: No  . Sexual activity: No   Other Topics Concern  . None   Social History Narrative   Patient lives at home spouse [name].  Has 2 daughters (52 & 23).   Currently "disabled".   Caffeine Use: tea 4 glasses daily.    Smokes ~1 1/2 PPD  05/15/15   Walks everyday - no set routine.    Family History  Problem Relation Age of Onset  . Emphysema Father   . Dementia Mother   . Diabetes Maternal Grandmother   . Diabetes Maternal Aunt   . Diabetes      mat. cousin  . Heart disease Brother   . Colon cancer Neg Hx         Review of Systems  Constitutional: Positive for irritability. Negative for fever, malaise/fatigue and weight loss.  HENT: Negative for congestion and sore throat.   Eyes:       Negative for visual changes  Respiratory: Positive for cough. Negative for shortness of breath.   Cardiovascular: Negative for chest pain, palpitations and leg swelling.  Gastrointestinal: Positive for abdominal pain and heartburn. Negative for blood in stool, constipation and diarrhea.  Genitourinary: Negative for dysuria, frequency and urgency.  Musculoskeletal: Positive for back pain. Negative for falls, joint pain and myalgias.       Has intermittent back spasms.  Skin: Negative for rash.  Neurological:  Positive for sensory change. Negative for dizziness and headaches.       Has intermittent numbness and tingling in feet.  Endo/Heme/Allergies: Does not bruise/bleed easily.  Psychiatric/Behavioral: Positive for depression. Negative for confusion, memory loss, substance abuse and suicidal ideas. The patient is nervous/anxious and has insomnia.    Reviewed labs and imaging results from  previous hospital visits.  Objective:   Vitals:   06/21/16 0859  BP: 116/78  Pulse: 88  Temp: 98 F (36.7 C)    Body mass index is 22.85 kg/m.   Physical Examination:  Physical Exam  Constitutional: She is oriented to person, place, and time and well-developed, well-nourished, and in no distress. No distress.  HENT:  Right Ear: External ear normal.  Left Ear: External ear normal.  Nose: Nose normal.  Mouth/Throat: Oropharynx is clear and moist. No oropharyngeal exudate.  Eyes: Conjunctivae and EOM are normal. Pupils are equal, round, and reactive to light. No scleral icterus.  Neck: Normal range of motion. Neck supple. No thyromegaly present.  Cardiovascular: Normal rate, normal heart sounds and intact distal pulses.   Pulmonary/Chest: Effort normal and breath sounds normal. She exhibits no tenderness. Right breast exhibits no inverted nipple, no mass, no nipple discharge, no skin change and no tenderness. Left breast exhibits mass. Left breast exhibits no inverted nipple, no nipple discharge, no skin change and no tenderness. Breasts are symmetrical.  Abdominal: Soft. Bowel sounds are normal. She exhibits distension. There is tenderness.  Diffuse ABD tenderness  Musculoskeletal: Normal range of motion.       Lumbar back: She exhibits tenderness and pain. She exhibits normal range of motion, no bony tenderness, no swelling, no edema and no spasm.  Lumbar paraspinous muscle tenderness. No rash  Lymphadenopathy:    She has no cervical adenopathy.  Neurological: She is alert and oriented to person,  place, and time. She has normal reflexes. No cranial nerve deficit. Gait normal. Coordination normal.  Skin: Skin is warm and dry.  Psychiatric: Affect and judgment normal.  Vitals reviewed.   ASSESSMENT and PLAN:  Hiroko was seen today for annual exam.  Diagnoses and all orders for this visit:  Encounter for preventative adult health care examination -     Hemoglobin A1C; Future -     TSH; Future -     HIV antibody; Future -     Hepatitis C Antibody; Future  Moderate persistent asthma with acute exacerbation  Mixed hyperlipidemia -     Lipid panel; Future  Anxiety -     traZODone (DESYREL) 50 MG tablet; Take 1 tablet (50 mg total) by mouth at bedtime. -     citalopram (CELEXA) 20 MG tablet; Take 1 tablet (20 mg total) by mouth daily. -     TSH; Future  Recurrent major depressive disorder, remission status unspecified (HCC) -     traZODone (DESYREL) 50 MG tablet; Take 1 tablet (50 mg total) by mouth at bedtime. -     citalopram (CELEXA) 20 MG tablet; Take 1 tablet (20 mg total) by mouth daily.  Spasm of lumbar paraspinous muscle -     cyclobenzaprine (FLEXERIL) 5 MG tablet; Take 1 tablet (5 mg total) by mouth 2 (two) times daily as needed for muscle spasms.  Increased urinary frequency -     Urine culture; Future -     POCT urinalysis dipstick -     Hemoglobin A1C; Future  Gastroesophageal reflux disease with esophagitis -     DEXILANT 60 MG capsule; Take 1 capsule (60 mg total) by mouth daily. Before breakfast  Closed fracture of left ankle, initial encounter -     Ambulatory referral to Orthopedic Surgery  Insomnia, unspecified type -     traZODone (DESYREL) 50 MG tablet; Take 1 tablet (50 mg total) by mouth at bedtime. -     TSH; Future  Other orders -     Cancel: escitalopram (LEXAPRO) 20 MG tablet; Take 1 tablet (20 mg total) by mouth daily. -     Cancel: ALPRAZolam (XANAX) 0.5 MG tablet; Take 1 tablet (0.5 mg total) by mouth 2 (two) times daily as needed for  anxiety.   No problem-specific Assessment & Plan notes found for this encounter.     Follow up: Return in about 1 month (around 07/22/2016).  Wilfred Lacy, NP

## 2016-06-21 NOTE — Assessment & Plan Note (Signed)
Current use of Breo and albuterol. Managed by Allergist. Advised about the need to quit tobacco use

## 2016-06-21 NOTE — Patient Instructions (Addendum)
Go to lab in basement for blood draw. You will be called with results Sign medical release to get records from previous provider. Return to office in 73month.  Requested order for Cologuard.

## 2016-06-21 NOTE — Progress Notes (Signed)
Reviewed with patient in office. See office note

## 2016-06-22 LAB — HIV ANTIBODY (ROUTINE TESTING W REFLEX): HIV: NONREACTIVE

## 2016-06-22 LAB — HEPATITIS C ANTIBODY: HCV AB: NEGATIVE

## 2016-06-23 LAB — URINE CULTURE: ORGANISM ID, BACTERIA: NO GROWTH

## 2016-06-25 ENCOUNTER — Telehealth: Payer: Self-pay

## 2016-06-25 ENCOUNTER — Other Ambulatory Visit: Payer: Self-pay

## 2016-06-25 MED ORDER — SIMVASTATIN 20 MG PO TABS: ORAL_TABLET | ORAL | 3 refills | Status: DC

## 2016-06-25 MED ORDER — FENOFIBRATE 145 MG PO TABS: 145.0000 mg | ORAL_TABLET | Freq: Every day | ORAL | 3 refills | Status: DC

## 2016-06-25 MED ORDER — MECLIZINE HCL 25 MG PO TABS: 25.0000 mg | ORAL_TABLET | Freq: Three times a day (TID) | ORAL | 0 refills | Status: DC

## 2016-06-25 NOTE — Telephone Encounter (Signed)
Lab results mailed per patient request.

## 2016-06-25 NOTE — Telephone Encounter (Signed)
Order [id number]

## 2016-06-25 NOTE — Progress Notes (Signed)
Normal results, see office note

## 2016-07-04 NOTE — Telephone Encounter (Signed)
Kit was delivered last week.

## 2016-07-18 ENCOUNTER — Ambulatory Visit: Payer: Medicare Other | Admitting: Allergy & Immunology

## 2016-07-22 ENCOUNTER — Ambulatory Visit (INDEPENDENT_AMBULATORY_CARE_PROVIDER_SITE_OTHER): Payer: Medicare Other | Admitting: Nurse Practitioner

## 2016-07-22 ENCOUNTER — Encounter: Payer: Self-pay | Admitting: Nurse Practitioner

## 2016-07-22 ENCOUNTER — Other Ambulatory Visit: Payer: Self-pay | Admitting: *Deleted

## 2016-07-22 ENCOUNTER — Other Ambulatory Visit (INDEPENDENT_AMBULATORY_CARE_PROVIDER_SITE_OTHER): Payer: Medicare Other

## 2016-07-22 VITALS — BP 122/76 | HR 98 | Temp 97.6°F | Ht 64.0 in | Wt 133.0 lb

## 2016-07-22 DIAGNOSIS — D72829 Elevated white blood cell count, unspecified: Secondary | ICD-10-CM

## 2016-07-22 DIAGNOSIS — M62830 Muscle spasm of back: Secondary | ICD-10-CM

## 2016-07-22 DIAGNOSIS — F419 Anxiety disorder, unspecified: Secondary | ICD-10-CM | POA: Diagnosis not present

## 2016-07-22 DIAGNOSIS — G43009 Migraine without aura, not intractable, without status migrainosus: Secondary | ICD-10-CM | POA: Insufficient documentation

## 2016-07-22 DIAGNOSIS — F339 Major depressive disorder, recurrent, unspecified: Secondary | ICD-10-CM

## 2016-07-22 DIAGNOSIS — J0140 Acute pansinusitis, unspecified: Secondary | ICD-10-CM

## 2016-07-22 DIAGNOSIS — G4700 Insomnia, unspecified: Secondary | ICD-10-CM

## 2016-07-22 LAB — CBC WITH DIFFERENTIAL/PLATELET
BASOS PCT: 0.4 % (ref 0.0–3.0)
Basophils Absolute: 0.1 10*3/uL (ref 0.0–0.1)
EOS PCT: 1.6 % (ref 0.0–5.0)
Eosinophils Absolute: 0.2 10*3/uL (ref 0.0–0.7)
HEMATOCRIT: 38.9 % (ref 36.0–46.0)
HEMOGLOBIN: 13.4 g/dL (ref 12.0–15.0)
LYMPHS PCT: 34 % (ref 12.0–46.0)
Lymphs Abs: 4.5 10*3/uL — ABNORMAL HIGH (ref 0.7–4.0)
MCHC: 34.3 g/dL (ref 30.0–36.0)
MCV: 83.7 fl (ref 78.0–100.0)
MONOS PCT: 5.3 % (ref 3.0–12.0)
Monocytes Absolute: 0.7 10*3/uL (ref 0.1–1.0)
Neutro Abs: 7.8 10*3/uL — ABNORMAL HIGH (ref 1.4–7.7)
Neutrophils Relative %: 58.7 % (ref 43.0–77.0)
Platelets: 341 10*3/uL (ref 150.0–400.0)
RBC: 4.65 Mil/uL (ref 3.87–5.11)
RDW: 15.5 % (ref 11.5–15.5)
WBC: 13.4 10*3/uL — AB (ref 4.0–10.5)

## 2016-07-22 MED ORDER — DM-GUAIFENESIN ER 30-600 MG PO TB12: 1.0000 | ORAL_TABLET | Freq: Two times a day (BID) | ORAL | 0 refills | Status: DC

## 2016-07-22 MED ORDER — SUMATRIPTAN SUCCINATE 25 MG PO TABS: 25.0000 mg | ORAL_TABLET | ORAL | 0 refills | Status: DC | PRN

## 2016-07-22 MED ORDER — FLUTICASONE PROPIONATE 50 MCG/ACT NA SUSP: 2.0000 | Freq: Every day | NASAL | 0 refills | Status: DC

## 2016-07-22 MED ORDER — CITALOPRAM HYDROBROMIDE 20 MG PO TABS: 20.0000 mg | ORAL_TABLET | Freq: Every day | ORAL | 2 refills | Status: DC

## 2016-07-22 MED ORDER — CYCLOBENZAPRINE HCL 5 MG PO TABS: 5.0000 mg | ORAL_TABLET | Freq: Two times a day (BID) | ORAL | 0 refills | Status: DC | PRN

## 2016-07-22 MED ORDER — TRAZODONE HCL 50 MG PO TABS: 50.0000 mg | ORAL_TABLET | Freq: Every day | ORAL | 2 refills | Status: DC

## 2016-07-22 MED ORDER — LEVOFLOXACIN 500 MG PO TABS
500.0000 mg | ORAL_TABLET | Freq: Every day | ORAL | 0 refills | Status: DC
Start: 2016-07-22 — End: 2016-08-01

## 2016-07-22 NOTE — Patient Instructions (Addendum)
[facility] Instructions: Flonase and Afrin use: apply 1spray of afrin in each nare, wait 18mins, then apply 2sprays of flonase in each nare. Use both nasal spray consecutively x 3days, then flonase only for at least 14days.  Use over-the-counter  "cold" medicines  such as "Tylenol cold" , "Advil cold",  "Mucinex" or" Mucinex D"  for cough and congestion.  Avoid decongestants if you have high blood pressure. Use" Delsym" or" Robitussin" cough syrup varietis for cough.  You can use plain "Tylenol" or "Advi"l for fever, chills and achyness.   "Common cold" symptoms are usually triggered by a virus.  The antibiotics are usually not necessary. On average, a" viral cold" illness would take 4-7 days to resolve. Please, make an appointment if you are not better or if you're worse.   General Headache Without Cause A headache is pain or discomfort felt around the head or neck area. There are many causes and types of headaches. In some cases, the cause may not be found.  HOME CARE  Managing Pain  Take over-the-counter and prescription medicines only as told by your doctor.  Lie down in a dark, quiet room when you have a headache.  If directed, apply ice to the head and neck area:  Put ice in a plastic bag.  Place a towel between your skin and the bag.  Leave the ice on for 20 minutes, 2-3 times per day.  Use a heating pad or hot shower to apply heat to the head and neck area as told by your doctor.  Keep lights dim if bright lights bother you or make your headaches worse. Eating and Drinking  Eat meals on a regular schedule.  Lessen how much alcohol you drink.  Lessen how much caffeine you drink, or stop drinking caffeine. General Instructions  Keep all follow-up visits as told by your doctor. This is important.  Keep a journal to find out if certain things bring on headaches. For example, write down:  What you eat and drink.  How much sleep you get.  Any change to your diet or  medicines.  Relax by getting a massage or doing other relaxing activities.  Lessen stress.  Sit up straight. Do not tighten (tense) your muscles.  Do not use tobacco products. This includes cigarettes, chewing tobacco, or e-cigarettes. If you need help quitting, ask your doctor.  Exercise regularly as told by your doctor.  Get enough sleep. This often means 7-9 hours of sleep. GET HELP IF:  Your symptoms are not helped by medicine.  You have a headache that feels different than the other headaches.  You feel sick to your stomach (nauseous) or you throw up (vomit).  You have a fever. GET HELP RIGHT AWAY IF:   Your headache becomes really bad.  You keep throwing up.  You have a stiff neck.  You have trouble seeing.  You have trouble speaking.  You have pain in the eye or ear.  Your muscles are weak or you lose muscle control.  You lose your balance or have trouble walking.  You feel like you will pass out (faint) or you pass out.  You have confusion.   This information is not intended to replace advice given to you by your health care provider. Make sure you discuss any questions you have with your health care provider.   Document Released: 06/11/2008 Document Revised: 05/24/2015 Document Reviewed: 12/26/2014 Elsevier Interactive Patient Education [employer].

## 2016-07-22 NOTE — Assessment & Plan Note (Addendum)
WBC from 21 to 13. Treated empirically with levaquin. Repeat CBC in 2weeks. Consider referral to hematologist is no improvement.

## 2016-07-22 NOTE — Assessment & Plan Note (Signed)
She declined referral to psychiatrist and psychologist. Continue celexa 20mg  at this time.

## 2016-07-22 NOTE — Progress Notes (Signed)
Pre visit review using our clinic review tool, if applicable. No additional management support is needed unless otherwise documented below in the visit note. 

## 2016-07-22 NOTE — Progress Notes (Signed)
Subjective:  Patient ID: Lori [name], female    DOB: [birth year]  Age: 60 y.o. MRN: [id number]  CC: Depression (Pt stated medication is doing ok.) and Migraine (no improvement with fiorcet)  Depression         This is a chronic problem.  The current episode started more than 1 year ago.   The onset quality is undetermined.   The problem occurs constantly.  The problem has been gradually improving since onset.  Associated symptoms include insomnia, restlessness, headaches and sad.  Associated symptoms include no decreased concentration, no fatigue, no helplessness, no hopelessness, not irritable, no decreased interest, no appetite change, no body aches, no myalgias, no indigestion and no suicidal ideas.     The symptoms are aggravated by family issues.  Treatments tried: xanax.  Compliance with treatment is variable.  Past compliance problems include difficulty understanding directions.  Previous treatment provided mild relief.  Risk factors include a change in medication usage/dosage, a change in medications, family history of mental illness and stress.   Past medical history includes chronic pain, fibromyalgia, recent illness and anxiety.     Pertinent negatives include no suicide attempts and no head trauma.  (she does not want a referral to psychiatrist) Migraine   This is a chronic problem. The current episode started more than 1 year ago. The problem occurs intermittently. The problem has been waxing and waning. The pain is located in the bilateral and frontal region. The pain does not radiate. The pain quality is similar to prior headaches. The quality of the pain is described as aching and sharp. Associated symptoms include insomnia and photophobia. Pertinent negatives include no abnormal behavior, fever, sinus pressure, visual change, vomiting or weakness. The symptoms are aggravated by emotional stress. Treatments tried: fiorcet. The treatment provided mild relief. Her past medical history is  significant for hypertension and migraine headaches. There is no history of cancer, cluster headaches, immunosuppression, obesity, pseudotumor cerebri, recent head traumas or sinus disease. (She does not want a referral to psychiatrist)  she was evaluated and treated by neurologist in past. Injections also used. Patient states injections were not helpful. Does not remember using imitrex in past. Last MRI brain 06/2015 (no acute finding).  Also needs flexeril refilled for lower back spasms. Denies any falls or back injury.   Outpatient Medications Prior to Visit  Medication Sig Dispense Refill  . acetaminophen (TYLENOL) 500 MG tablet Take 2 tablets (1,000 mg total) by mouth every 6 (six) hours as needed. 30 tablet 0  . Albuterol Sulfate (PROAIR RESPICLICK) 123XX123 (90 Base) MCG/ACT AEPB Inhale 2 puffs into the lungs every 6 (six) hours as needed. 1 each 3  . aspirin 81 MG chewable tablet Chew 81 mg by mouth every morning.    Marland Kitchen DEXILANT 60 MG capsule Take 1 capsule (60 mg total) by mouth daily. Before breakfast 30 capsule 3  . fenofibrate (TRICOR) 145 MG tablet Take 1 tablet (145 mg total) by mouth daily. ----take this medication for cholesterol--- 30 tablet 3  . meclizine (ANTIVERT) 25 MG tablet Take 1 tablet (25 mg total) by mouth 3 (three) times daily. Only as needed for DIZZINESS 21 tablet 0  . montelukast (SINGULAIR) 10 MG tablet Take 10 mg by mouth daily.     . nitroGLYCERIN (NITROSTAT) 0.4 MG SL tablet Place 1 tablet (0.4 mg total) under the tongue every 5 (five) minutes as needed for chest pain. 25 tablet 3  . ondansetron (ZOFRAN) 4 MG tablet     .  oxyCODONE-acetaminophen (PERCOCET/ROXICET) 5-325 MG tablet TK 1 T PO Q 4 H PRF PAIN. MAY TK 2 TS Q 6 H PRF SEVERE PAIN. DO NOT TK WITH TYLENOL  0  . simvastatin (ZOCOR) 20 MG tablet TK 1 T PO  QPM BEFORE BEDTIME---take this medication for cholesterol--- 30 tablet 3  . sucralfate (CARAFATE) 1 GM/10ML suspension Take 10 mLs (1 g total) by mouth 4  (four) times daily. 420 mL 1  . valACYclovir (VALTREX) 500 MG tablet take 1 tablet by motuh twice per day  5  . ALPRAZolam (XANAX) 1 MG tablet TK 1 T PO QD PRA  2  . citalopram (CELEXA) 20 MG tablet Take 1 tablet (20 mg total) by mouth daily. 30 tablet 2  . cyclobenzaprine (FLEXERIL) 5 MG tablet Take 1 tablet (5 mg total) by mouth 2 (two) times daily as needed for muscle spasms. 30 tablet 0  . traMADol (ULTRAM) 50 MG tablet TK 1 T  PO BID PRN P  0  . traZODone (DESYREL) 50 MG tablet Take 1 tablet (50 mg total) by mouth at bedtime. 30 tablet 2  . cyclobenzaprine (FLEXERIL) 10 MG tablet      No facility-administered medications prior to visit.     ROS See HPI  Objective:  BP 122/76 (BP Location: Left Arm, Patient Position: Sitting, Cuff Size: Normal)   Pulse 98   Temp 97.6 F (36.4 C)   Ht 5\' 4"  (1.626 m)   Wt 133 lb (60.3 kg)   SpO2 98%   BMI 22.83 kg/m   BP Readings from Last 3 Encounters:  07/22/16 122/76  06/21/16 116/78  06/19/16 116/78    Wt Readings from Last 3 Encounters:  07/22/16 133 lb (60.3 kg)  06/21/16 129 lb (58.5 kg)  06/19/16 130 lb 3.2 oz (59.1 kg)    Physical Exam  Constitutional: She is oriented to person, place, and time. She is not irritable. No distress.  HENT:  Right Ear: External ear normal.  Left Ear: External ear normal.  Nose: Nose normal.  Mouth/Throat: Oropharynx is clear and moist. No oropharyngeal exudate.  Eyes: No scleral icterus.  Neck: Normal range of motion. Neck supple.  Cardiovascular: Normal rate and normal heart sounds.   Pulmonary/Chest: Effort normal and breath sounds normal.  Abdominal: Soft. Bowel sounds are normal. There is no tenderness.  Musculoskeletal: Normal range of motion. She exhibits no edema.  Lymphadenopathy:    She has no cervical adenopathy.  Neurological: She is alert and oriented to person, place, and time.  Skin: Skin is warm and dry.  Vitals reviewed.   Lab Results  Component Value Date   WBC 13.4  (H) 07/22/2016   HGB 13.4 07/22/2016   HCT 38.9 07/22/2016   PLT 341.0 07/22/2016   GLUCOSE 105 (H) 10/15/2015   CHOL 345 (H) 06/21/2016   TRIG 395.0 (H) 06/21/2016   HDL 45.30 06/21/2016   LDLDIRECT 268.0 06/21/2016   ALT 11 (L) 10/15/2015   AST 19 10/15/2015   NA 136 10/15/2015   K 3.3 (L) 10/15/2015   CL 104 10/15/2015   CREATININE 0.86 10/15/2015   BUN 9 10/15/2015   CO2 19 (L) 10/15/2015   TSH 1.74 06/21/2016   INR 0.9 08/31/2008   HGBA1C 5.5 06/21/2016    Dg Ankle Complete Right  Result Date: 05/10/2016 CLINICAL DATA:  Fall.  Rolling injury of right ankle. EXAM: RIGHT ANKLE - COMPLETE 3+ VIEW COMPARISON:  Right ankle radiograph 01/05/2006. FINDINGS: Small, crescent-shaped osseous fragment at the  distal aspect of the right lateral malleolus, likely minimally displaced avulsion fracture fragment. No other evidence of fracture identified. The ankle mortise is approximated. Moderate soft tissue swelling overlying the lateral malleolus. IMPRESSION: Minimally displaced avulsion fracture at the distal aspect of the right fibula with overlying lateral malleolus soft tissue swelling. Electronically Signed   By: Ulyses Jarred M.D.   On: 05/10/2016 15:57   Dg Knee Complete 4 Views Left  Result Date: 05/10/2016 CLINICAL DATA:  Fall on left knee EXAM: LEFT KNEE - COMPLETE 4+ VIEW COMPARISON:  Left knee radiograph 12/07/2015, 03/11/2015 FINDINGS: There is prepatellar soft tissue swelling. No sizable knee joint effusion. There is mild degenerative spurring of the femorotibial joint spaces, which are otherwise preserved. No acute fracture is identified. IMPRESSION: 1. No acute fracture or dislocation at the left knee. 2. Prepatellar soft tissue swelling without sizable joint effusion. Electronically Signed   By: Ulyses Jarred M.D.   On: 05/10/2016 16:03    Assessment & Plan:   Tarianna was seen today for depression and migraine.  Diagnoses and all orders for this visit:  Recurrent major  depressive disorder, remission status unspecified (HCC) -     citalopram (CELEXA) 20 MG tablet; Take 1 tablet (20 mg total) by mouth daily. -     traZODone (DESYREL) 50 MG tablet; Take 1 tablet (50 mg total) by mouth at bedtime.  Migraine without aura and without status migrainosus, not intractable -     SUMAtriptan (IMITREX) 25 MG tablet; Take 1 tablet (25 mg total) by mouth every 2 (two) hours as needed for migraine or headache. May repeat in 2 hours if headache persists or recurs. Do not use more than 100mg  in 24hrs.  Anxiety -     citalopram (CELEXA) 20 MG tablet; Take 1 tablet (20 mg total) by mouth daily. -     traZODone (DESYREL) 50 MG tablet; Take 1 tablet (50 mg total) by mouth at bedtime.  Insomnia, unspecified type -     traZODone (DESYREL) 50 MG tablet; Take 1 tablet (50 mg total) by mouth at bedtime.  Spasm of lumbar paraspinous muscle -     cyclobenzaprine (FLEXERIL) 5 MG tablet; Take 1 tablet (5 mg total) by mouth 2 (two) times daily as needed for muscle spasms.  Leukocytosis, unspecified type -     CBC w/Diff; Future -     CBC w/Diff; Future -     levofloxacin (LEVAQUIN) 500 MG tablet; Take 1 tablet (500 mg total) by mouth daily.  Acute non-recurrent pansinusitis -     dextromethorphan-guaiFENesin (MUCINEX DM) 30-600 MG 12hr tablet; Take 1 tablet by mouth 2 (two) times daily. -     fluticasone (FLONASE) 50 MCG/ACT nasal spray; Place 2 sprays into both nostrils daily. -     levofloxacin (LEVAQUIN) 500 MG tablet; Take 1 tablet (500 mg total) by mouth daily.   I have discontinued Ms. Karnik's traMADol and ALPRAZolam. I am also having her start on SUMAtriptan, dextromethorphan-guaiFENesin, fluticasone, and levofloxacin. Additionally, I am having her maintain her montelukast, valACYclovir, nitroGLYCERIN, aspirin, acetaminophen, sucralfate, Albuterol Sulfate, DEXILANT, fenofibrate, simvastatin, meclizine, oxyCODONE-acetaminophen, ondansetron, citalopram, traZODone, and  cyclobenzaprine.  Meds ordered this encounter  Medications  . SUMAtriptan (IMITREX) 25 MG tablet    Sig: Take 1 tablet (25 mg total) by mouth every 2 (two) hours as needed for migraine or headache. May repeat in 2 hours if headache persists or recurs. Do not use more than 100mg  in 24hrs.    Dispense:  30  tablet    Refill:  0    Order Specific Question:   Supervising Provider    Answer:   Cassandria Anger [[id number]]  . citalopram (CELEXA) 20 MG tablet    Sig: Take 1 tablet (20 mg total) by mouth daily.    Dispense:  30 tablet    Refill:  2    Order Specific Question:   Supervising Provider    Answer:   Cassandria Anger [[id number]]  . traZODone (DESYREL) 50 MG tablet    Sig: Take 1 tablet (50 mg total) by mouth at bedtime.    Dispense:  30 tablet    Refill:  2    Order Specific Question:   Supervising Provider    Answer:   Cassandria Anger [[id number]]  . cyclobenzaprine (FLEXERIL) 5 MG tablet    Sig: Take 1 tablet (5 mg total) by mouth 2 (two) times daily as needed for muscle spasms.    Dispense:  30 tablet    Refill:  0    Order Specific Question:   Supervising Provider    Answer:   Cassandria Anger [[id number]]  . dextromethorphan-guaiFENesin (MUCINEX DM) 30-600 MG 12hr tablet    Sig: Take 1 tablet by mouth 2 (two) times daily.    Dispense:  20 tablet    Refill:  0    Order Specific Question:   Supervising Provider    Answer:   Cassandria Anger [[id number]]  . fluticasone (FLONASE) 50 MCG/ACT nasal spray    Sig: Place 2 sprays into both nostrils daily.    Dispense:  16 g    Refill:  0    Order Specific Question:   Supervising Provider    Answer:   Cassandria Anger [[id number]]  . levofloxacin (LEVAQUIN) 500 MG tablet    Sig: Take 1 tablet (500 mg total) by mouth daily.    Dispense:  7 tablet    Refill:  0    Order Specific Question:   Supervising Provider    Answer:   Cassandria Anger [[id number]]   CBC Latest Ref Rng & Units 07/22/2016 10/15/2015 08/24/2015  WBC 4.0 - 10.5 K/uL  13.4(H) 21.8(H) -  Hemoglobin 12.0 - 15.0 g/dL 13.4 11.4(L) 11.9(L)  Hematocrit 36.0 - 46.0 % 38.9 35.3(L) 35.0(L)  Platelets 150.0 - 400.0 K/uL 341.0 362 -   Follow-up: Return in about 3 months (around 10/22/2016), or headache, hyperlipidermia, depression.  Wilfred Lacy, NP

## 2016-07-22 NOTE — Progress Notes (Unsigned)
Pre visit review using our clinic review tool, if applicable. No additional management support is needed unless otherwise documented below in the visit note. 

## 2016-07-22 NOTE — Assessment & Plan Note (Signed)
Consider referral to neurologist if not managed with imitrex.

## 2016-07-24 ENCOUNTER — Telehealth: Payer: Self-pay | Admitting: Nurse Practitioner

## 2016-07-24 ENCOUNTER — Ambulatory Visit (INDEPENDENT_AMBULATORY_CARE_PROVIDER_SITE_OTHER): Payer: Medicare Other | Admitting: *Deleted

## 2016-07-24 ENCOUNTER — Encounter (INDEPENDENT_AMBULATORY_CARE_PROVIDER_SITE_OTHER): Payer: Self-pay | Admitting: Orthopaedic Surgery

## 2016-07-24 ENCOUNTER — Ambulatory Visit (INDEPENDENT_AMBULATORY_CARE_PROVIDER_SITE_OTHER): Payer: Medicare Other | Admitting: Orthopaedic Surgery

## 2016-07-24 VITALS — BP 125/68 | HR 72

## 2016-07-24 DIAGNOSIS — J309 Allergic rhinitis, unspecified: Secondary | ICD-10-CM

## 2016-07-24 DIAGNOSIS — S82892A Other fracture of left lower leg, initial encounter for closed fracture: Secondary | ICD-10-CM

## 2016-07-24 DIAGNOSIS — S8000XA Contusion of unspecified knee, initial encounter: Secondary | ICD-10-CM | POA: Insufficient documentation

## 2016-07-24 NOTE — Telephone Encounter (Signed)
Routing to [place], please advise, thanks 

## 2016-07-24 NOTE — Telephone Encounter (Signed)
Pt called in said that her headache meds are not working and would like to know if Baldo Ash could call something else in to help

## 2016-07-24 NOTE — Progress Notes (Signed)
Office Visit Note   Patient: Lori [name]           Date of Birth: [birth year]           MRN: [id number] Visit Date: 07/24/2016              Requested by: Flossie Buffy, NP [address], [place] [address] PCP: Wilfred Lacy, NP   Assessment & Plan: Visit Diagnoses:  1. Closed fracture of left ankle, initial encounter   lateral ankle sprain with Chip fibular avulsion.  #2. Fall with left knee contusion. We reviewed x-rays that were obtained when she is emergency room for her ankle and her knee. No evidence of new fracture she does have the chip lateral ankle ligament evulsion on the right ankle. She has overall weakness and needs to work on increasing her ambulation distance and work on strengthening her legs. Plan: Patient has overall generalized decondition. Problems with depression still smoking we discussed smoking sensation. She's had problems with balance problems falling. I discussed her trying to minimize medications that may make her at increased risk for falling. Patient unable to go up steps without hanging onto the handrail. She has overall weakness of her legs arms and his generalized decondition state. She needs to increase her activity rate we discussed the things that she could do she'll start with a walking program to improve her bone density.  Follow-Up Instructions: No Follow-up on file.   Orders:  No orders of the defined types were placed in this encounter.  No orders of the defined types were placed in this encounter.     Procedures: No procedures performed   Clinical Data: No additional findings.   Subjective: Chief Complaint  Patient presents with  . Left Knee - Pain, Follow-up  . Right Ankle - Pain, Follow-up    Lori [name] is a 60 year old patient, following up for left knee pain and right ankle pain. Patient wears knee brace. She is currently doing home exercises. She has some stinging, popping, and cracking at times. Sometimes  she feels like knee will give out. Also still having ankle pain. She states Pain level 10/10 and is taking time to heal. Finished pain medication.    Review of Systems  Constitutional: Negative for chills and diaphoresis.  HENT: Negative for ear discharge, ear pain and nosebleeds.   Eyes: Negative for discharge and visual disturbance.  Respiratory: Negative for cough, choking and shortness of breath.   Cardiovascular: Negative for chest pain and palpitations.  Gastrointestinal: Negative for abdominal distention and abdominal pain.  Endocrine: Negative for cold intolerance and heat intolerance.  Genitourinary: Negative for flank pain and hematuria.  Skin: Negative for rash and wound.  Neurological: Negative for seizures and speech difficulty.  Hematological: Negative for adenopathy. Does not bruise/bleed easily.  Psychiatric/Behavioral: Negative for agitation and suicidal ideas.     Objective: Vital Signs: BP 125/68   Pulse 72   Physical Exam  Constitutional: She is oriented to person, place, and time. She appears well-developed.  HENT:  Head: Normocephalic.  Right Ear: External ear normal.  Left Ear: External ear normal.  Eyes: Pupils are equal, round, and reactive to light.  Neck: No tracheal deviation present. No thyromegaly present.  Cardiovascular: Normal rate.   Pulmonary/Chest: Effort normal.  Abdominal: Soft.  Neurological: She is alert and oriented to person, place, and time.  Skin: Skin is warm and dry.  Psychiatric: She has a normal mood and affect. Her behavior is normal.  Ortho Exam exam demonstrates that she is inflammatory with a slow stride gait. She has tenderness the there is resolving ecchymosis over the left patella where she fell and landed on her knee. She has tenderness laterally over the right ankle at the chip evulsion fracture of the of the fibula with the lateral ankle sprain. She still has quad weakness both right and left. Negative straight leg  raising 90 no pain with hip range of motion. No ankle effusion collateral ligaments are stable. Slight crepitus with knee extension both right and left. No evidence of recurrent recurrence or re-formation of the left knee medial plica. No calf tenderness distal pulses are intact no rash or exposed skin.  Specialty Comments:  No specialty comments available.  Imaging: No results found.   PMFS History: Patient Active Problem List   Diagnosis Date Noted  . Knee contusion 07/24/2016  . Leukocytosis 07/22/2016  . Migraine without aura and without status migrainosus, not intractable 07/22/2016  . Ankle fracture, left 06/21/2016  . Spasm of lumbar paraspinous muscle 06/21/2016  . Chronic rhinitis 06/19/2016  . Moderate persistent asthma with acute exacerbation 06/19/2016  . Compliance poor 06/19/2016  . Gait difficulty 06/21/2015  . Fibromyalgia 06/21/2015  . Conversion reaction 06/21/2015  . Depression 06/21/2015  . Syncope and collapse 06/21/2015  . Plica syndrome of left knee 01/06/2015  . Bronchitis 09/06/2014  . Anxiety 09/06/2014  . History of MI 09/06/2014  . Costochondritis 07/29/2013  . Chest pain with low risk of acute coronary syndrome 06/27/2013  . Leg fatigue 06/27/2013  . Smoking 06/27/2013  . Borderline hypertension 06/27/2013  . Low back pain 06/14/2013  . Leg pain, bilateral 06/14/2013  . GASTROPARESIS 10/26/2009  . Chest pain 05/02/2009  . Hyperlipidemia 11/18/2008  . ASTHMA, UNSPECIFIED, UNSPECIFIED STATUS 11/18/2008  . COPD 11/18/2008  . HEPATIC CYST 11/18/2008   Past Medical History:  Diagnosis Date  . Acute MI    x3  . Allergy   . Anemia   . Ankle fracture 05/2016  . Anxiety   . Arthritis   . Asthma   . Barrett esophagus 2007  . Chest pain 05-02-2009   echo  EF 55%  . COPD (chronic obstructive pulmonary disease) with chronic bronchitis (HCC)    & Emphysema  . Depression   . Duodenitis without mention of hemorrhage 2007  . Esophageal reflux  2007  . Esophageal stricture   . Full dentures   . H/O cardiovascular stress test 05-02-2009   low risk scan, neg. for ischemia, no ekg changes  . H/O hiatal hernia   . Hiatal hernia [id number]  . Hyperlipidemia   . Multiple fractures    from falls, fx rt. elbow, fx left wrist, bilateral ankles  . Ulcer ([place])     Family History  Problem Relation Age of Onset  . Emphysema Father   . Dementia Mother   . Diabetes Maternal Grandmother   . Diabetes Maternal Aunt   . Diabetes      mat. cousin  . Heart disease Brother   . Colon cancer Neg Hx     Past Surgical History:  Procedure Laterality Date  . ABDOMINAL HYSTERECTOMY    . CHOLECYSTECTOMY    . CHONDROPLASTY Left 01/06/2015   Procedure: CHONDROPLASTY;  Surgeon: Marybelle Killings, MD;  Location: [facility];  Service: Orthopedics;  Laterality: Left;  . COLONOSCOPY    . ELBOW FRACTURE SURGERY     right  . KNEE ARTHROSCOPY WITH EXCISION PLICA Left 0000000  Procedure: KNEE ARTHROSCOPY WITH EXCISION PLICA;  Surgeon: Marybelle Killings, MD;  Location: [facility];  Service: Orthopedics;  Laterality: Left;  . Lower Extremity Arterial Dopplers  07/06/2013   RABI 1.0, LABI 1.1.; No evidence of significant vascular atherogenic plaque  . NECK SURGERY    . NM MYOVIEW LTD  07/02/2013   Normal LV function, EF 83%. (Likely overestimated due to low volume);; no evidence of ischemia or infarction. Normal wall motion.  . TONSILLECTOMY    . WRIST FRACTURE SURGERY     left, has plate   Social History   Occupational History  . disable    Social History Main Topics  . Smoking status: Current Every Day Smoker    Packs/day: 1.50    Years: 21.00    Types: Cigarettes  . Smokeless tobacco: Never Used     Comment: tobaccco info given 03/06/12, 1.5 PPD 05/15/15  . Alcohol use No  . Drug use: No  . Sexual activity: No

## 2016-07-24 NOTE — Patient Instructions (Signed)
Walk more to strengthen your legs . Continue to work on Forensic scientist. Stop smoking, get more active

## 2016-07-25 ENCOUNTER — Ambulatory Visit: Payer: Medicaid Other | Admitting: Allergy & Immunology

## 2016-07-25 ENCOUNTER — Telehealth: Payer: Self-pay

## 2016-07-25 NOTE — Telephone Encounter (Signed)
Patient called back in and she has been taking all meds you prescribed yesterday and today---she's aware that headache is prob coming from sinus congestions and she will continue meds for that, but she states her head hurts so bad she cant sleep or function-----can you advise anything else to add to help with sinus pressure pain/headache---routing to [place], please advise, thanks

## 2016-07-25 NOTE — Telephone Encounter (Signed)
Patient advised of [place]ottes note---patient to call neurology

## 2016-07-25 NOTE — Telephone Encounter (Signed)
I had prescribed imitrex for her migraine headache. If this is ineffectiv, she will need to contact neurologist again, who has managed her headache in past. Thank you

## 2016-07-31 ENCOUNTER — Other Ambulatory Visit: Payer: Self-pay | Admitting: Nurse Practitioner

## 2016-08-01 ENCOUNTER — Ambulatory Visit: Payer: Self-pay | Admitting: *Deleted

## 2016-08-01 ENCOUNTER — Encounter: Payer: Self-pay | Admitting: Allergy & Immunology

## 2016-08-01 ENCOUNTER — Telehealth: Payer: Self-pay | Admitting: Allergy & Immunology

## 2016-08-01 ENCOUNTER — Ambulatory Visit (INDEPENDENT_AMBULATORY_CARE_PROVIDER_SITE_OTHER): Payer: Medicare Other | Admitting: Allergy & Immunology

## 2016-08-01 ENCOUNTER — Encounter (INDEPENDENT_AMBULATORY_CARE_PROVIDER_SITE_OTHER): Payer: Self-pay

## 2016-08-01 VITALS — BP 110/80 | HR 88 | Temp 98.3°F

## 2016-08-01 DIAGNOSIS — J4541 Moderate persistent asthma with (acute) exacerbation: Secondary | ICD-10-CM

## 2016-08-01 DIAGNOSIS — J3089 Other allergic rhinitis: Secondary | ICD-10-CM

## 2016-08-01 DIAGNOSIS — J019 Acute sinusitis, unspecified: Secondary | ICD-10-CM

## 2016-08-01 DIAGNOSIS — J309 Allergic rhinitis, unspecified: Secondary | ICD-10-CM

## 2016-08-01 DIAGNOSIS — Z9114 Patient's other noncompliance with medication regimen: Secondary | ICD-10-CM

## 2016-08-01 DIAGNOSIS — L230 Allergic contact dermatitis due to metals: Secondary | ICD-10-CM

## 2016-08-01 DIAGNOSIS — Z91148 Patient's other noncompliance with medication regimen for other reason: Secondary | ICD-10-CM | POA: Insufficient documentation

## 2016-08-01 MED ORDER — MECLIZINE HCL 25 MG PO TABS: 25.0000 mg | ORAL_TABLET | Freq: Three times a day (TID) | ORAL | 0 refills | Status: DC

## 2016-08-01 MED ORDER — TRIAMCINOLONE ACETONIDE 0.5 % EX OINT: TOPICAL_OINTMENT | CUTANEOUS | 1 refills | Status: DC

## 2016-08-01 MED ORDER — AMOXICILLIN-POT CLAVULANATE 875-125 MG PO TABS: 1.0000 | ORAL_TABLET | Freq: Two times a day (BID) | ORAL | 0 refills | Status: DC

## 2016-08-01 MED ORDER — FLUTICASONE FUROATE 200 MCG/ACT IN AEPB: 1.0000 | INHALATION_SPRAY | Freq: Every day | RESPIRATORY_TRACT | 5 refills | Status: DC

## 2016-08-01 NOTE — Patient Instructions (Addendum)
1. Moderate persistent asthma, uncomplicated - Start controller medication: Arnuity 200 mcg one puff daily.  - Use ProAir Respliclick four puffs every 4-6 hours as needed. - It is important that you stop smoking or limit it at the very least.   2. Acute sinusitis, recurrence not specified, unspecified location - Start Augmentin 875mg  one tablet twice daily for 14 days. - Use nasal saline rinses twice daily. - Stop the Levaquin.  3. Contact dermatitis  - Start triamcinolone 0.5% ointment twice daily to the affected area until it clears. - Avoid wearing the necklace.   4. Chronic allergic rhinitis - Use Flonase two sprays per nostril daily. - Continue allergy shots at our clinic.   5. Return in about 4 weeks (around 08/29/2016).  Please inform us of any Emergency Department visits, hospitalizations, or changes in symptoms. Call us before going to the ED for breathing or allergy symptoms since we might be able to fit you in for a sick visit. Feel free to contact us anytime with any questions, problems, or concerns.  It was a pleasure to see you and your family again today! HAPPY BIRTHDAY!   Websites that have reliable patient information: 1. American Academy of Asthma, Allergy, and Immunology: www.aaaai.org 2. Food Allergy Research and Education (FARE): foodallergy.org 3. Mothers of Asthmatics: http://www.asthmacommunitynetwork.org 4. American College of Allergy, Asthma, and Immunology: www.acaai.org

## 2016-08-01 NOTE — Progress Notes (Signed)
FOLLOW UP  Date of Service/Encounter:  08/01/16   Assessment:   Allergic contact dermatitis due to metals  Moderate persistent asthma with acute exacerbation  Acute sinusitis, recurrence not specified, unspecified location  Chronic nonseasonal allergic rhinitis due to other allergen   Asthma Reportables:  Severity: moderate persistent  Risk: high due to multiple steroid courses as well as noncompliance and decreased health literacy of the patient Control: not well controlled  Seasonal Influenza Vaccine: yes    Plan/Recommendations:   1. Moderate persistent asthma, uncomplicated - Start controller medication: Arnuity 200 mcg one puff daily.  - Hopefully the inhaled steroid within the Arnuity inhaler will control her symptoms without the side effects of tachycardia on the long-acting beta agonist. - Use ProAir Respliclick four puffs every 4-6 hours as needed. - It is important that you stop smoking or limit it at the very least.   2. Acute sinusitis, recurrence not specified, unspecified location - Start Augmentin 875mg  one tablet twice daily for 14 days. - Emphasized the need for her to pick up the medication and take it.  - Use nasal saline rinses twice daily. - I did recommend stopping the levofloxacin and listed this as an intolerance on her allergy list. - This should not preclude the use of it if indicated in the future.  3. Contact dermatitis  - Start triamcinolone 0.5% ointment twice daily to the affected area until it clears. - Avoid wearing the necklace.  - We could consider patch testing in the future to determine which metal was causing the allergy. - Lori [name] was requesting oral steroids, however I feel that a topical steroid would be more efficacious with fewer side effects.  4. Chronic allergic rhinitis - Use Flonase two sprays per nostril daily. - Continue allergy shots at our clinic. - She has a history of adverse events when receiving her immunotherapy  at an outside clinic.  5. Return in about 4 weeks (around 08/29/2016).     Subjective:   Lori [name] is a 60 y.o. female presenting today for follow up of  Chief Complaint  Patient presents with  . Follow-up    C/O rash-break out from a necklace that was given to her.   Lori [name] has a history of the following: Patient Active Problem List   Diagnosis Date Noted  . Knee contusion 07/24/2016  . Leukocytosis 07/22/2016  . Migraine without aura and without status migrainosus, not intractable 07/22/2016  . Ankle fracture, left 06/21/2016  . Spasm of lumbar paraspinous muscle 06/21/2016  . Chronic rhinitis 06/19/2016  . Moderate persistent asthma with acute exacerbation 06/19/2016  . Compliance poor 06/19/2016  . Gait difficulty 06/21/2015  . Fibromyalgia 06/21/2015  . Conversion reaction 06/21/2015  . Depression 06/21/2015  . Syncope and collapse 06/21/2015  . Plica syndrome of left knee 01/06/2015  . Bronchitis 09/06/2014  . Anxiety 09/06/2014  . History of MI 09/06/2014  . Costochondritis 07/29/2013  . Chest pain with low risk of acute coronary syndrome 06/27/2013  . Leg fatigue 06/27/2013  . Smoking 06/27/2013  . Borderline hypertension 06/27/2013  . Low back pain 06/14/2013  . Leg pain, bilateral 06/14/2013  . GASTROPARESIS 10/26/2009  . Chest pain 05/02/2009  . Hyperlipidemia 11/18/2008  . ASTHMA, UNSPECIFIED, UNSPECIFIED STATUS 11/18/2008  . COPD 11/18/2008  . HEPATIC CYST 11/18/2008    History obtained from: chart review and patient (accompanied by friend).  Lori [name] was referred by Wilfred Lacy, NP.  Lori [name] is a 60 y.o. female presenting for a sick visit. Lori [name] was last seen in October 2017 by myself. At that time, diagnosed her with an asthma exacerbation. Her medication history was all over the place, and a call to the pharmacy did not clarify things at all. We decided to make her regimen easier and recommended taking Breo 200/25 one  puff once per day. I also started her on a prednisone pack in clinic that day. We had a long conversation about smoking cessation and she was interested in talking to her primary care physician about Chantix. I also diagnosed her with sinusitis and started her on Augmentin for 14 days and recommended nasal saline rinses. I encouraged her to continue with Flonase 2 sprays per nostril daily and allergy shots.  Since the last visit, she reports that she has done fairly well. When I walk in the room, she announces "My birthday is next week!" very loudly. As part of her birthday celebration, she received a necklace from a friend of hers. She was told that it was pierced [place], however she developed an erythematous irritation around where the necklace touched her neck. She has since stopped using necklace and is interested in getting treatment for this rash. She has never had any similar rash in the past area from an asthma perspective, she reports that she took the [facility] for 1-2 days, but stopped taking it because it made her "nervous" and it made her heart beat quite fast. Of note, she does have a history of atrial fibrillation according to the patient. Since stopping the controller medication, she has increased her use of the rescue medication. It is very difficult to get any kind of history of what medication she has tried in the past.  She also reports that she was recently diagnosed with sinus infection around to the weeks ago by her primary care physician. She was placed on Levaquin, which caused abdominal pain with the first dose. She stopped taking that immediately. Her symptoms obviously have not improved and she is requesting another antibiotic today. She was started on the Augmentin at the last visit, and I asked her about that antibiotic and I receive a blank stare. Apparently, she never picked it up from her pharmacy. She continues to take her Flonase and nasal saline rinses.   Otherwise, there have  been no changes to her past medical history, surgical history, family history, or social history.    Review of Systems: a 14-point review of systems is pertinent for what is mentioned in HPI.  Otherwise, all other systems were negative. Constitutional: negative other than that listed in the HPI Eyes: negative other than that listed in the HPI Ears, nose, mouth, throat, and face: negative other than that listed in the HPI Respiratory: negative other than that listed in the HPI Cardiovascular: negative other than that listed in the HPI Gastrointestinal: negative other than that listed in the HPI Genitourinary: negative other than that listed in the HPI Integument: negative other than that listed in the HPI Hematologic: negative other than that listed in the HPI Musculoskeletal: negative other than that listed in the HPI Neurological: negative other than that listed in the HPI Allergy/Immunologic: negative other than that listed in the HPI    Objective:   Blood pressure 110/80, pulse 88, temperature 98.3 F (36.8 C), temperature source Oral, SpO2 97 %. There is no height or weight on file to calculate BMI.   Physical Exam:  General: Alert, interactive, in no  acute distress. Difficulty understanding her speech. Very boisterous and engaging. HEENT: TMs pearly gray, turbinates edematous and pale with clear discharge, post-pharynx erythematous. Neck: Supple without thyromegaly. Lungs: Clear to auscultation without wheezing, rhonchi or rales. No increased work of breathing. CV: Normal S1/S2, no murmurs. Capillary refill <2 seconds.  Abdomen: Nondistended, nontender. No guarding or rebound tenderness. Bowel sounds present in all fields  Skin: Warm and dry, without lesions or rashes. Extremities:  No clubbing, cyanosis or edema. Neuro:   Grossly intact.  Diagnostic studies:  Spirometry: results normal (FEV1: 1.71/73%, FVC: 2.40/83%, FEV1/FVC: 71%).    Spirometry consistent with normal  pattern.   Allergy Studies: None     Salvatore Marvel, MD [facility]

## 2016-08-01 NOTE — Telephone Encounter (Signed)
Pt called and said that her rx was not called into [facility] on [address]

## 2016-08-02 NOTE — Telephone Encounter (Signed)
Spoke with patient and she was able to pick up her scripts.

## 2016-08-15 ENCOUNTER — Other Ambulatory Visit: Payer: Self-pay | Admitting: Nurse Practitioner

## 2016-08-15 DIAGNOSIS — F339 Major depressive disorder, recurrent, unspecified: Secondary | ICD-10-CM

## 2016-08-15 DIAGNOSIS — F419 Anxiety disorder, unspecified: Secondary | ICD-10-CM

## 2016-08-15 DIAGNOSIS — G4700 Insomnia, unspecified: Secondary | ICD-10-CM

## 2016-08-20 ENCOUNTER — Telehealth: Payer: Self-pay | Admitting: *Deleted

## 2016-08-20 NOTE — Telephone Encounter (Signed)
She can use imodium OTC as directed on package. If that does not help, she will need to make OV appt. Thank you

## 2016-08-20 NOTE — Telephone Encounter (Signed)
Left msg on triage stating she would like for [facility] to rx something for diarrhea,,/lmb

## 2016-08-21 NOTE — Telephone Encounter (Signed)
Called pt no answer LMOM w/[name] response.../lmb 

## 2016-08-22 ENCOUNTER — Telehealth: Payer: Self-pay | Admitting: Nurse Practitioner

## 2016-08-22 MED ORDER — LOPERAMIDE HCL 2 MG PO CAPS: 2.0000 mg | ORAL_CAPSULE | Freq: Four times a day (QID) | ORAL | 0 refills | Status: DC | PRN

## 2016-08-28 ENCOUNTER — Encounter: Payer: Self-pay | Admitting: Gastroenterology

## 2016-08-28 ENCOUNTER — Ambulatory Visit (INDEPENDENT_AMBULATORY_CARE_PROVIDER_SITE_OTHER): Payer: Medicare Other | Admitting: Gastroenterology

## 2016-08-28 VITALS — BP 124/82 | HR 72 | Ht 63.0 in | Wt 131.8 lb

## 2016-08-28 DIAGNOSIS — Z1211 Encounter for screening for malignant neoplasm of colon: Secondary | ICD-10-CM

## 2016-08-28 DIAGNOSIS — R1084 Generalized abdominal pain: Secondary | ICD-10-CM

## 2016-08-28 MED ORDER — ONDANSETRON HCL 4 MG PO TABS: ORAL_TABLET | ORAL | 0 refills | Status: DC

## 2016-08-28 NOTE — Progress Notes (Signed)
[facility] Progress Note  Chief Complaint: generalized abdominal pain  Subjective  History: See Sept 2017 note Years of chronic abd pain and bloating, functional and opioids Again c/o back and all over muscle and joint pain Bowel habits alternate, no rectal bleeding Last colonoscopy 2006 Visible distention that she showed me Could not afford movantik  ROS: Cardiovascular:  no chest pain Respiratory: no dyspnea  The patient's Past Medical, Family and Social History were reviewed and are on file in the EMR.  Objective:  Med list reviewed  Vital signs in last 24 hrs: Vitals:   08/28/16 0929  BP: 124/82  Pulse: 72    Physical Exam  Histrionic with pain  HEENT: sclera anicteric, oral mucosa moist without lesions  Neck: supple, no thyromegaly, JVD or lymphadenopathy  Cardiac: RRR without murmurs, S1S2 heard, no peripheral edema  Pulm: clear to auscultation bilaterally, normal RR and effort noted  Abdomen: soft, mild generalized tenderness, with active bowel sounds. No guarding or palpable hepatosplenomegaly.  Skin; warm and dry, no jaundice or rash   Radiologic studies:  Last CT scan 2016 normal  @ASSESSMENTPLANBEGIN @ Assessment: Encounter Diagnoses  Name Primary?  . Generalized abdominal pain Yes  . Special screening for malignant neoplasms, colon    We discussed that she has had an extensive workup over many years, and that she clearly has abd pain related to global chronic pain syndrome She again asked me for pain meds, and I again declined.  Plan: Screening colonoscopy.   Total time 15 minutes, over half spent in counseling and coordination of care.   Nelida Meuse III

## 2016-08-28 NOTE — Patient Instructions (Signed)
If you are age 60 or older, your body mass index should be between 23-30. Your Body mass index is 23.35 kg/m. If this is out of the aforementioned range listed, please consider follow up with your Primary Care Provider.  If you are age 43 or younger, your body mass index should be between 19-25. Your Body mass index is 23.35 kg/m. If this is out of the aformentioned range listed, please consider follow up with your Primary Care Provider.   You have been scheduled for a colonoscopy. Please follow written instructions given to you at your visit today.  Please pick up your prep supplies at the pharmacy within the next 1-3 days. If you use inhalers (even only as needed), please bring them with you on the day of your procedure. Your physician has requested that you go to www.startemmi.com and enter the access code given to you at your visit today. This web site gives a general overview about your procedure. However, you should still follow specific instructions given to you by our office regarding your preparation for the procedure.  Thank you for choosing [facility]  Dr Wilfrid Lund III

## 2016-08-29 ENCOUNTER — Encounter: Payer: Self-pay | Admitting: Allergy & Immunology

## 2016-08-29 ENCOUNTER — Ambulatory Visit: Payer: Self-pay

## 2016-08-29 ENCOUNTER — Ambulatory Visit (INDEPENDENT_AMBULATORY_CARE_PROVIDER_SITE_OTHER): Payer: Medicare Other | Admitting: Allergy & Immunology

## 2016-08-29 VITALS — BP 120/70 | HR 72 | Resp 16

## 2016-08-29 DIAGNOSIS — L230 Allergic contact dermatitis due to metals: Secondary | ICD-10-CM

## 2016-08-29 DIAGNOSIS — J4541 Moderate persistent asthma with (acute) exacerbation: Secondary | ICD-10-CM | POA: Diagnosis not present

## 2016-08-29 DIAGNOSIS — J3089 Other allergic rhinitis: Secondary | ICD-10-CM | POA: Diagnosis not present

## 2016-08-29 DIAGNOSIS — J309 Allergic rhinitis, unspecified: Secondary | ICD-10-CM

## 2016-08-29 MED ORDER — MONTELUKAST SODIUM 10 MG PO TABS: 10.0000 mg | ORAL_TABLET | Freq: Every day | ORAL | 5 refills | Status: DC

## 2016-08-29 NOTE — Progress Notes (Signed)
FOLLOW UP  Date of Service/Encounter:  08/29/16   Assessment:   Moderate persistent asthma with acute exacerbation  Allergic contact dermatitis due to metals  Chronic nonseasonal allergic rhinitis due to other allergen   Asthma Reportables:  Severity: moderate persistent  Risk: high Control: not well controlled  Seasonal Influenza Vaccine: yes   The CDC recommends patients with persistent asthma between the ages of 19-64 years receive PPSV-23 vaccination, if this patient qualifies, has he/she received 1 dose of PPSV-23 yet? Yes (May 2015)    Plan/Recommendations:   1. Moderate persistent asthma, uncomplicated - Continue controller medication: Arnuity 200 mcg one puff daily.  - Use ProAir Respliclick four puffs every 4-6 hours as needed. - Spirometry was notable for marked reversibility, therefore we will treat you with a prednisone burst: 60mg  x3 days, 40mg  x3 days, 20mg  x 3 days, and then stop.  - This is her second course of prednisone since I started taking care of her in October 2017.  - Given the marked reversibility, I am skeptical as to whether she is taking the medication at all. - We will obtain a refill history to confirm adherence. - We again discussed smoking cessation and she would like to try Chantix. - Her next appointment with her PCP is in February but I asked her to call the office to see if they could prescribe this over the phone, as I have no experience with it.  - Other treatment options to consider in the future include an anti-IL5 agent (last AEC in November 2017 was 200) or Xolair (will need to get an IgE level).  - If we decide to pursue an anti-IL5 agent, she would need to get Nucala or another agent administered in clinic given concerns with non-adherence and decreased health literacy.   2. Contact dermatitis  - Continue to avoid wearing metal jewelry.  - Use triamcinolone 0.5% ointment twice daily to the affected area until it clears.  3.  Chronic allergic rhinitis - with a history of reactions when shots were administered at an outside clinic - We will replace the Flonase with a different nasal steroid: Nasacort 2 sprays per nostril daily. - Continue with Singulair 10mg  daily. - Start cetirizine (Zyrtec) 10mg  daily to help with the sneezing and other allergy symptoms.  - Continue allergy shots at our clinic.  5. Return in about 4 months (around 12/28/2016).    Subjective:   Lori [name] is a 60 y.o. female presenting today for follow up of  Chief Complaint  Patient presents with  . Follow-up  .  Lori [name] has a history of the following: Patient Active Problem List   Diagnosis Date Noted  . Non compliance w medication regimen 08/01/2016  . Allergic contact dermatitis due to metals 08/01/2016  . Knee contusion 07/24/2016  . Leukocytosis 07/22/2016  . Migraine without aura and without status migrainosus, not intractable 07/22/2016  . Ankle fracture, left 06/21/2016  . Spasm of lumbar paraspinous muscle 06/21/2016  . Chronic rhinitis 06/19/2016  . Moderate persistent asthma with acute exacerbation 06/19/2016  . Compliance poor 06/19/2016  . Gait difficulty 06/21/2015  . Fibromyalgia 06/21/2015  . Conversion reaction 06/21/2015  . Depression 06/21/2015  . Syncope and collapse 06/21/2015  . Plica syndrome of left knee 01/06/2015  . Bronchitis 09/06/2014  . Anxiety 09/06/2014  . History of MI 09/06/2014  . Costochondritis 07/29/2013  . Chest pain with low risk of acute coronary syndrome 06/27/2013  . Leg fatigue 06/27/2013  .  Smoking 06/27/2013  . Borderline hypertension 06/27/2013  . Low back pain 06/14/2013  . Leg pain, bilateral 06/14/2013  . GASTROPARESIS 10/26/2009  . Chest pain 05/02/2009  . Hyperlipidemia 11/18/2008  . ASTHMA, UNSPECIFIED, UNSPECIFIED STATUS 11/18/2008  . COPD 11/18/2008  . HEPATIC CYST 11/18/2008    History obtained from: chart review and patient.  Lori [name] was  referred by Wilfred Lacy, NP.     Lori [name] is a 60 y.o. female presenting for a follow up visit. At the last visit, we changed her to [place] 258mcg one inhalation daily as a means of simplifying her regimen. We also changed her to [place] to avoid the need for a spacer.  we treated her with Augmentin 875 mg 1 tablet twice daily for 14 days and encouraged nasal saline rinses. She was complaining of contact dermatitis secondary to a necklace, therefore I recommended that she stop the necklace and use triamcinolone twice daily as needed until he clears. She was continued on Flonase 2 sprays per nostril daily as well as allergy shots.   Since last visit, she has mostly done well. She completed her Augmentin for the last visit and reports that her sinuses feel much improved. She is taking Singulair daily and needs refills today. She is not taking an antihistamine at all and is endorsing breakthrough allergy symptoms including sneezing. She is on a nose spray but endorses burning with the use of it (fluticasone). She has not tried Nasacort or Nasonex. She remains on her allergy shots, which she receives once per month. She feels like they are working well.   Lori [name]'s asthma has been well controlled. She has not required rescue medication, experienced nocturnal awakenings due to lower respiratory symptoms, nor have activities of daily living been limited. She is using her inhaler 2 times twice daily instead of one time once daily. She reports that she does not have a rescue inhaler, but we did send in [place] at the last visit. She denies nighttime coughing wheezing, but her spirometry is not great today. She has baseline shortness of breath secondary to her prolonged history of smoking and COPD. She is interested in quitting, and requests that I prescribed Chantix today. Her next appointment with her primary care provider is in February 2018. She has not tried to call her primary care provider  and get a prescription for Chantix.  Otherwise, there have been no changes to her past medical history, surgical history, family history, or social history.    Review of Systems: a 14-point review of systems is pertinent for what is mentioned in HPI.  Otherwise, all other systems were negative. Constitutional: negative other than that listed in the HPI Eyes: negative other than that listed in the HPI Ears, nose, mouth, throat, and face: negative other than that listed in the HPI Respiratory: negative other than that listed in the HPI Cardiovascular: negative other than that listed in the HPI Gastrointestinal: negative other than that listed in the HPI Genitourinary: negative other than that listed in the HPI Integument: negative other than that listed in the HPI Hematologic: negative other than that listed in the HPI Musculoskeletal: negative other than that listed in the HPI Neurological: negative other than that listed in the HPI Allergy/Immunologic: negative other than that listed in the HPI    Objective:   Blood pressure 120/70, pulse 72, resp. rate 16. There is no height or weight on file to calculate BMI.   Physical Exam:  General: Alert,  interactive, in no acute distress. Cooperative with the exam. Very animated and talkative. Eyes: No conjunctival injection present on the right, No conjunctival injection present on the left, PERRL bilaterally, No discharge on the right and No discharge on the left Ears: Right TM pearly gray with normal light reflex, Left TM pearly gray with normal light reflex, Right TM intact without perforation and Left TM intact without perforation.  Nose/Throat: External nose within normal limits and septum midline, turbinates edematous without discharge, post-pharynx erythematous without cobblestoning in the posterior oropharynx. Tonsils 3+ without exudates Neck: Supple without thyromegaly. Lungs: Decreased breath sounds bilaterally without wheezing,  rhonchi or rales. No increased work of breathing. CV: Normal S1/S2, no murmurs. Capillary refill <2 seconds.  Abdomen: Nondistended, nontender. No guarding or rebound tenderness. Bowel sounds present in all fields and hyperactive  Skin: Warm and dry, without lesions or rashes. Neuro:   Grossly intact. No focal deficits appreciated. Responsive to questions.   Diagnostic studies:  Spirometry: results abnormal (FEV1: 1.03/42%, FVC: 1.03/42%, FEV1/FVC: 1.17/37%).    Spiroemetry consistent with severe restriction. Albuterol/Atrovent nebulizer treatment given in clinic with significant improvement in the FEV1 and FVC. FVC increased 80% and the FEV1 increased to 52%. FVL appeared much more full and normal.   Allergy Studies: None    Salvatore Marvel, MD [facility]

## 2016-08-29 NOTE — Patient Instructions (Addendum)
1. Moderate persistent asthma, uncomplicated - Continue controller medication: Arnuity 200 mcg one puff daily.  - Use ProAir Respliclick four puffs every 4-6 hours as needed. - Spirometry was notable for marked reversibility, therefore we will treat you with a prednisone burst: 60mg  x3 days, 40mg  x3 days, 20mg  x 3 days, and then stop.  - It is important that you stop smoking or limit it at the very least.  - Call your PCP's office about getting Chantix prescribed. - They might be willing to do this over the phone.   2. Contact dermatitis  - Continue to avoid wearing metal jewelry.  - Use triamcinolone 0.5% ointment twice daily to the affected area until it clears.  3. Chronic allergic rhinitis - We will replace the Flonase with a different nasal steroid: Nasacort 2 sprays per nostril daily. - Continue with Singulair 10mg  daily. - Start cetirizine (Zyrtec) 10mg  daily to help with the sneezing and other allergy symptoms.  - Continue allergy shots at our clinic.   5. Return in about 4 months (around 12/28/2016).  Please inform us of any Emergency Department visits, hospitalizations, or changes in symptoms. Call us before going to the ED for breathing or allergy symptoms since we might be able to fit you in for a sick visit. Feel free to contact us anytime with any questions, problems, or concerns.  It was a pleasure to see you and your family again today! Happy holidays!   Websites that have reliable patient information: 1. American Academy of Asthma, Allergy, and Immunology: www.aaaai.org 2. Food Allergy Research and Education (FARE): foodallergy.org 3. Mothers of Asthmatics: http://www.asthmacommunitynetwork.org 4. American College of Allergy, Asthma, and Immunology: www.acaai.org

## 2016-08-30 ENCOUNTER — Ambulatory Visit (AMBULATORY_SURGERY_CENTER): Payer: Medicare Other | Admitting: Gastroenterology

## 2016-08-30 ENCOUNTER — Encounter: Payer: Self-pay | Admitting: Gastroenterology

## 2016-08-30 VITALS — BP 127/55 | HR 67 | Temp 98.0°F | Resp 14 | Ht 63.0 in | Wt 131.0 lb

## 2016-08-30 DIAGNOSIS — Z1212 Encounter for screening for malignant neoplasm of rectum: Secondary | ICD-10-CM | POA: Diagnosis not present

## 2016-08-30 DIAGNOSIS — D122 Benign neoplasm of ascending colon: Secondary | ICD-10-CM | POA: Diagnosis not present

## 2016-08-30 DIAGNOSIS — D123 Benign neoplasm of transverse colon: Secondary | ICD-10-CM

## 2016-08-30 DIAGNOSIS — D127 Benign neoplasm of rectosigmoid junction: Secondary | ICD-10-CM

## 2016-08-30 DIAGNOSIS — D128 Benign neoplasm of rectum: Secondary | ICD-10-CM | POA: Diagnosis not present

## 2016-08-30 DIAGNOSIS — Z1211 Encounter for screening for malignant neoplasm of colon: Secondary | ICD-10-CM | POA: Diagnosis present

## 2016-08-30 MED ORDER — SODIUM CHLORIDE 0.9 % IV SOLN: 500.0000 mL | INTRAVENOUS | Status: DC

## 2016-08-30 NOTE — Progress Notes (Signed)
A and O x3. Report to RN. Tolerated MAC anesthesia well. 

## 2016-08-30 NOTE — Patient Instructions (Signed)
Impression/recommendations:  Polyps (handout given)  YOU HAD AN ENDOSCOPIC PROCEDURE TODAY AT THE [facility]:   Refer to the procedure report that was given to you for any specific questions about what was found during the examination.  If the procedure report does not answer your questions, please call your gastroenterologist to clarify.  If you requested that your care partner not be given the details of your procedure findings, then the procedure report has been included in a sealed envelope for you to review at your convenience later.  YOU SHOULD EXPECT: Some feelings of bloating in the abdomen. Passage of more gas than usual.  Walking can help get rid of the air that was put into your GI tract during the procedure and reduce the bloating. If you had a lower endoscopy (such as a colonoscopy or flexible sigmoidoscopy) you may notice spotting of blood in your stool or on the toilet paper. If you underwent a bowel prep for your procedure, you may not have a normal bowel movement for a few days.  Please Note:  You might notice some irritation and congestion in your nose or some drainage.  This is from the oxygen used during your procedure.  There is no need for concern and it should clear up in a day or so.  SYMPTOMS TO REPORT IMMEDIATELY:   Following lower endoscopy (colonoscopy or flexible sigmoidoscopy):  Excessive amounts of blood in the stool  Significant tenderness or worsening of abdominal pains  Swelling of the abdomen that is new, acute  Fever of 100F or higher  For urgent or emergent issues, a gastroenterologist can be reached at any hour by calling [phone].   DIET:  We do recommend a small meal at first, but then you may proceed to your regular diet.  Drink plenty of fluids but you should avoid alcoholic beverages for 24 hours.  ACTIVITY:  You should plan to take it easy for the rest of today and you should NOT DRIVE or use heavy machinery until tomorrow  (because of the sedation medicines used during the test).    FOLLOW UP: Our staff will call the number listed on your records the next business day following your procedure to check on you and address any questions or concerns that you may have regarding the information given to you following your procedure. If we do not reach you, we will leave a message.  However, if you are feeling well and you are not experiencing any problems, there is no need to return our call.  We will assume that you have returned to your regular daily activities without incident.  If any biopsies were taken you will be contacted by phone or by letter within the next 1-3 weeks.  Please call us at [phone] if you have not heard about the biopsies in 3 weeks.    SIGNATURES/CONFIDENTIALITY: You and/or your care partner have signed paperwork which will be entered into your electronic medical record.  These signatures attest to the fact that that the information above on your After Visit Summary has been reviewed and is understood.  Full responsibility of the confidentiality of this discharge information lies with you and/or your care-partner. 

## 2016-08-30 NOTE — Op Note (Signed)
[facility] Patient Name: Lori [name] Procedure Date: 08/30/2016 3:36 PM MRN: [id number] Endoscopist: Mallie Mussel L. Loletha Carrow , MD Age: 60 Referring MD:  Date of Birth: 1955-12-29 Gender: Female Account #: [id number] Procedure:                Colonoscopy Indications:              Screening for colorectal malignant neoplasm Medicines:                Monitored Anesthesia Care Procedure:                Pre-Anesthesia Assessment:                           - Prior to the procedure, a History and Physical                            was performed, and patient medications and                            allergies were reviewed. The patient's tolerance of                            previous anesthesia was also reviewed. The risks                            and benefits of the procedure and the sedation                            options and risks were discussed with the patient.                            All questions were answered, and informed consent                            was obtained. Prior Anticoagulants: The patient has                            taken no previous anticoagulant or antiplatelet                            agents. ASA Grade Assessment: III - A patient with                            severe systemic disease. After reviewing the risks                            and benefits, the patient was deemed in                            satisfactory condition to undergo the procedure.                           After obtaining informed consent, the colonoscope  was passed under direct vision. Throughout the                            procedure, the patient's blood pressure, pulse, and                            oxygen saturations were monitored continuously. The                            Model PCF-[id number]) scope was introduced                            through the anus and advanced to the the cecum,                            identified  by appendiceal orifice and ileocecal                            valve. The colonoscopy was performed without                            difficulty. The patient tolerated the procedure                            well. The quality of the bowel preparation was good                            after lavage. The ileocecal valve, appendiceal                            orifice, and rectum were photographed. The quality                            of the bowel preparation was evaluated using the                            BBPS [facility] Bowel Preparation Scale) with scores                            of: Right Colon = 2, Transverse Colon = 2 and Left                            Colon = 2. The total BBPS score equals 6. The bowel                            preparation used was SUPREP. Scope In: 3:48:50 PM Scope Out: 4:03:53 PM Scope Withdrawal Time: 0 hours 12 minutes 21 seconds  Total Procedure Duration: 0 hours 15 minutes 3 seconds  Findings:                 The perianal and digital rectal examinations were  normal.                           Three sessile polyps were found in the mid                            transverse colon and mid ascending colon. The                            polyps were 4 mm in size. These polyps were removed                            with a cold snare. Resection and retrieval were                            complete.                           A 6 mm polyp was found in the rectum. The polyp was                            semi-pedunculated. The polyp was removed with a hot                            snare. Resection and retrieval were complete.                           The exam was otherwise without abnormality on                            direct and retroflexion views. Complications:            No immediate complications. Estimated Blood Loss:     Estimated blood loss: none. Impression:               - Three 4 mm polyps in the mid transverse colon  and                            in the mid ascending colon, removed with a cold                            snare. Resected and retrieved.                           - One 6 mm polyp in the rectum, removed with a hot                            snare. Resected and retrieved.                           - The examination was otherwise normal on direct                            and retroflexion views. Recommendation:           -  Patient has a contact number available for                            emergencies. The signs and symptoms of potential                            delayed complications were discussed with the                            patient. Return to normal activities tomorrow.                            Written discharge instructions were provided to the                            patient.                           - Resume previous diet.                           - Continue present medications.                           - Await pathology results.                           - Repeat colonoscopy is recommended for                            surveillance. The colonoscopy date will be                            determined after pathology results from today's                            exam become available for review. Henry L. Loletha Carrow, MD 08/30/2016 4:09:01 PM This report has been signed electronically.

## 2016-09-02 ENCOUNTER — Telehealth: Payer: Self-pay

## 2016-09-02 NOTE — Telephone Encounter (Signed)
Left message on answering machine. 

## 2016-09-02 NOTE — Telephone Encounter (Signed)
  Follow up Call-  Call back number 08/30/2016 06/10/2016  Post procedure Call Back phone  # [phone]  Permission to leave phone message Yes -  Some recent data might be hidden    Patient was called for follow up after her procedure on 08/30/2016. No answer at the number given for follow up phone call. A message was left on the answering machine.

## 2016-09-04 NOTE — Progress Notes (Signed)
We received Ms. Cederberg's refill history from [facility]. From reviewing it, it is clear that she is not filling her medications regularly. Although she does get the occasional sample from our clinic, this would certainly not be enough to explain the glaring deficiencies in her refill history.   Antibiotics Augmentin - Oct 2017, Nov 2017, March 2017, Feb 2017,  Amox - March 2017 x2 Azithro - Aug 2016, Dec 2016 Doxy - July 2017 x2 Levo - Nov 2017  Asthma meds Ruthe Mannan - Nov 2016 Sheria Lang - Nov 2017 Adair Patter - October 2017 Singulair - Oct 2016, July 2017 ProAir - Oct 2017 Pred - Dec 2016, Jan 2017  Theophylline - Aug 2017 (**PRESCRIBED BY PCP**)  Allergy meds Flonase - Nov 2017 Nasonex - Oct 2016   Salvatore Marvel, MD [facility] [place] [facility] [place]

## 2016-09-05 ENCOUNTER — Encounter: Payer: Self-pay | Admitting: Gastroenterology

## 2016-09-06 ENCOUNTER — Encounter: Payer: Self-pay | Admitting: Allergy & Immunology

## 2016-09-11 ENCOUNTER — Other Ambulatory Visit: Payer: Self-pay | Admitting: Nurse Practitioner

## 2016-09-11 DIAGNOSIS — M62830 Muscle spasm of back: Secondary | ICD-10-CM

## 2016-09-13 ENCOUNTER — Telehealth: Payer: Self-pay | Admitting: Gastroenterology

## 2016-09-13 NOTE — Telephone Encounter (Signed)
You are right, she and I have been over this multiple times. She has a chronic pain syndrome which also includes abdominal pain but without any digestive disorder.  I again decline to prescribe pain medicine.  My apologies, but I am afraid I have nothing more to offer.

## 2016-09-13 NOTE — Telephone Encounter (Signed)
Called patient back, left detailed message stating that Dr. Loletha Carrow will not be prescribing pain medication as there is no digestive disorder. Suggested that she contact her PCP to see if she has any recommendations. Told her if she has questions or concerns to call our office.

## 2016-09-13 NOTE — Telephone Encounter (Signed)
Patient advised of her colonoscopy results, read her the letter that was sent 09/11/16, told her to expect it in the mail. Patient understands she will need to have a colonoscopy in 5 years due to precancerous polyps.  She also complained about her generalized abdominal pain, mainly when she is laying down. Otherwise, normal bm's, able to eat and drink with no difficulty. She asked for pain medication, told her that looking at last office note, Dr. Loletha Carrow did not want to prescribe pain medication. Told her that I would send a message to Dr. Loletha Carrow to ask for any recommendations.

## 2016-09-18 ENCOUNTER — Other Ambulatory Visit: Payer: Self-pay | Admitting: Allergy & Immunology

## 2016-09-18 NOTE — Telephone Encounter (Signed)
Please advise 

## 2016-09-18 NOTE — Telephone Encounter (Signed)
Reviewed note. I would prefer seeing her in person before prescribing antibiotics over the phone. She could also go to see Urgent Care or her PCP for evaluation. Encourage nasal saline rinses and Flonase up to two sprays per nostril twice daily. She also needs to restart her allergy shots.  Thanks, Salvatore Marvel, MD [place] [facility] [place]

## 2016-09-18 NOTE — Telephone Encounter (Signed)
Patient saw Dr. Ernst Bowler on 08-29-16 with a sinus infection and was given amoxicillin. She called today and said she still has the sinus infection and would like more amoxicillin called in to [facility] on [address].

## 2016-09-19 NOTE — Telephone Encounter (Signed)
Spoke to patient and informed her that she needed to come in to be seen before medications can be prescribe.

## 2016-09-26 ENCOUNTER — Encounter: Payer: Self-pay | Admitting: Allergy & Immunology

## 2016-09-26 ENCOUNTER — Ambulatory Visit: Payer: Self-pay

## 2016-09-26 ENCOUNTER — Ambulatory Visit (INDEPENDENT_AMBULATORY_CARE_PROVIDER_SITE_OTHER): Payer: Medicare Other | Admitting: Allergy & Immunology

## 2016-09-26 VITALS — BP 118/72 | HR 90 | Temp 97.7°F | Resp 19 | Ht 61.5 in | Wt 134.2 lb

## 2016-09-26 DIAGNOSIS — J309 Allergic rhinitis, unspecified: Secondary | ICD-10-CM

## 2016-09-26 DIAGNOSIS — J3089 Other allergic rhinitis: Secondary | ICD-10-CM

## 2016-09-26 DIAGNOSIS — J4540 Moderate persistent asthma, uncomplicated: Secondary | ICD-10-CM | POA: Diagnosis not present

## 2016-09-26 DIAGNOSIS — J019 Acute sinusitis, unspecified: Secondary | ICD-10-CM

## 2016-09-26 MED ORDER — AMOXICILLIN-POT CLAVULANATE 875-125 MG PO TABS: 1.0000 | ORAL_TABLET | Freq: Two times a day (BID) | ORAL | 0 refills | Status: AC

## 2016-09-26 MED ORDER — BENZONATATE 100 MG PO CAPS: 200.0000 mg | ORAL_CAPSULE | Freq: Three times a day (TID) | ORAL | 0 refills | Status: AC | PRN

## 2016-09-26 NOTE — Patient Instructions (Addendum)
1. Moderate persistent asthma, uncomplicated - Lung testing looked good today. - There is no need for prednisone. - Daily controller medication(s): Arnuity 277mcg once daily - Rescue medications: ProAir 4 puffs every 4-6 hours as needed or albuterol nebulizer one vial puffs every 4-6 hours as needed - Asthma control goals:  * Full participation in all desired activities (may need albuterol before activity) * Albuterol use two time or less a week on average (not counting use with activity) * Cough interfering with sleep two time or less a month * Oral steroids no more than once a year * No hospitalizations  2. Acute sinusitis - Start Augmentin 875mg  twice daily for 14 days. - Use nasal saline rinses. - We will get a workup for an immune deficiency since you have had so many courses of antibiotics. - We may need a sinus CT in the future.   3. Chronic nonseasonal allergic rhinitis due to other allergen - Continue with Nasacort 2 sprays per nostril daily. - Continue with Singulair 10mg  daily. - Continue with cetirizine (Zyrtec) 10mg  daily to help with the sneezing and other allergy symptoms.  - Continue allergy shots at our clinic.  4. Return in about 3 months (around 12/25/2016).  Please inform us of any Emergency Department visits, hospitalizations, or changes in symptoms. Call us before going to the ED for breathing or allergy symptoms since we might be able to fit you in for a sick visit. Feel free to contact us anytime with any questions, problems, or concerns.  It was a pleasure to see you again today! Best wishes in the [place] Year!   Websites that have reliable patient information: 1. American Academy of Asthma, Allergy, and Immunology: www.aaaai.org 2. Food Allergy Research and Education (FARE): foodallergy.org 3. Mothers of Asthmatics: http://www.asthmacommunitynetwork.org 4. American College of Allergy, Asthma, and Immunology: www.acaai.org

## 2016-09-26 NOTE — Progress Notes (Signed)
FOLLOW UP  Date of Service/Encounter:  09/26/16   Assessment:   Moderate persistent asthma, uncomplicated  Acute recurrent sinusitis   Chronic allergic rhinitis  History of noncompliance  Current smoker   Asthma Reportables:  Severity: moderate persistent  Risk: high Control: well controlled  Seasonal Influenza Vaccine: yes   PPSV-23 Vaccine (CDC recommended for patients with persistent asthma 19-61yo): No    Plan/Recommendations:   1. Moderate persistent asthma - complicated by non-compliance - Lung testing looked good today. - There is no need for prednisone. - Daily controller medication(s): Arnuity 27mcg once daily - Rescue medications: ProAir 4 puffs every 4-6 hours as needed or albuterol nebulizer one vial puffs every 4-6 hours as needed - Asthma control goals:  * Full participation in all desired activities (may need albuterol before activity) * Albuterol use two time or less a week on average (not counting use with activity) * Cough interfering with sleep two time or less a month * Oral steroids no more than once a year * No hospitalizations  2. Acute sinusitis - in the setting of chronic rhinosinusitis - Start Augmentin 875mg  twice daily for 14 days. - Did recommend continuing with nasal saline rinses. - If there is no improvement after this course of antibiotics, we will obtain a sinus CT and refer back to ENT. - According to the patient, ENT saw her around 1 year ago (Dr. Melissa Montane) and did not feel that anything was needed. - We will get Dr. Janace Hoard records to see assess his thoughts on Ms. Krawczyk.   3. Recurrent infections - In 2017, she received 10 courses of antibiotics per my most recent pharmacy review. - Because of this, we will get a workup to look for immune deficiencies. - I feel that many of her infections are related to her copious cigarette smoking and subsequent ciliary dysfunction. - We have discussed smoking cessation at multiple  visits, but she continues to smoke nonetheless. - She is going to talk to her PCP about Chantix (follow up with PCP is in early February).   4. Chronic nonseasonal allergic rhinitis due to other allergen - Continue with Nasacort 2 sprays per nostril daily. - Continue with Singulair 10mg  daily. - Continue with cetirizine (Zyrtec) 10mg  daily to help with the sneezing and other allergy symptoms.  - Continue allergy shots at our clinic.  5. Return in about 3 months (around 12/25/2016).      Subjective:   Lori [name] is a 61 y.o. female presenting today for follow up of  Chief Complaint  Patient presents with  . Follow-up    sick  . Nasal Congestion    4-5 weeks with yellow discharge- has some nose bleeds on the right side with some burning  . Cough    4- 5 weeks -productive with yellow discharge    Lori [name] has a history of the following: Patient Active Problem List   Diagnosis Date Noted  . Non compliance w medication regimen 08/01/2016  . Allergic contact dermatitis due to metals 08/01/2016  . Knee contusion 07/24/2016  . Leukocytosis 07/22/2016  . Migraine without aura and without status migrainosus, not intractable 07/22/2016  . Ankle fracture, left 06/21/2016  . Spasm of lumbar paraspinous muscle 06/21/2016  . Chronic rhinitis 06/19/2016  . Moderate persistent asthma with acute exacerbation 06/19/2016  . Compliance poor 06/19/2016  . Gait difficulty 06/21/2015  . Fibromyalgia 06/21/2015  . Conversion reaction 06/21/2015  . Depression 06/21/2015  . Syncope and  collapse 06/21/2015  . Plica syndrome of left knee 01/06/2015  . Bronchitis 09/06/2014  . Anxiety 09/06/2014  . History of MI 09/06/2014  . Costochondritis 07/29/2013  . Chest pain with low risk of acute coronary syndrome 06/27/2013  . Leg fatigue 06/27/2013  . Smoking 06/27/2013  . Borderline hypertension 06/27/2013  . Low back pain 06/14/2013  . Leg pain, bilateral 06/14/2013  . GASTROPARESIS  10/26/2009  . Chest pain 05/02/2009  . Hyperlipidemia 11/18/2008  . ASTHMA, UNSPECIFIED, UNSPECIFIED STATUS 11/18/2008  . COPD 11/18/2008  . HEPATIC CYST 11/18/2008    History obtained from: chart review and patient.  [name] was referred by Wilfred Lacy, NP.      Lori [name] is a 61 y.o. female presenting for a sick visit. She was last seen in December 2017 at which time she was doing fairly well. She was continued on Arnuity 200 mcg 1 puff daily and ProAir as needed. She was having worsening respiratory symptoms and her spirometry was notable for obstructive disease. She did have marked reversibility therefore we put her on a prednisone burst. Because of the reversibility, I was concerned with non-compliance. A pharmacy refill history was notable for non-compliance as well as recurrent antibiotic courses and prednisone courses.   Since the last visit she did well after the last course of Augmentin in November. However she had rebound symptoms and called a week ago for another antibiotic. I recommended that she seek help at an Urgent Care or her PCP but then she made an appointment with me instead. She has continued to have thick copious mucous production from her nose. She endorses sinus pain bilaterally and postnasal drip. She is using her Flonase and saline (at least according to the patient).   Ms. Benningfield's asthma has been under good control. She claims to be on the [employer], although she is unable to actually name the medication. Her spirometry is normal today, suprisingly. Her denies use of her rescue medication. She continues to smoke and has not called her PCP about starting Chantix. She does have an appointment in February with her PCP.   Otherwise, there have been no changes to her past medical history, surgical history, family history, or social history.    Review of Systems: a 14-point review of systems is pertinent for what is mentioned in HPI.  Otherwise, all other systems  were negative. Constitutional: negative other than that listed in the HPI Eyes: negative other than that listed in the HPI Ears, nose, mouth, throat, and face: negative other than that listed in the HPI Respiratory: negative other than that listed in the HPI Cardiovascular: negative other than that listed in the HPI Gastrointestinal: negative other than that listed in the HPI Genitourinary: negative other than that listed in the HPI Integument: negative other than that listed in the HPI Hematologic: negative other than that listed in the HPI Musculoskeletal: negative other than that listed in the HPI Neurological: negative other than that listed in the HPI Allergy/Immunologic: negative other than that listed in the HPI    Objective:   Blood pressure 118/72, pulse 90, temperature 97.7 F (36.5 C), temperature source Oral, resp. rate 19, height 5' 1.5" (1.562 m), weight 134 lb 3.2 oz (60.9 kg), SpO2 96 %. Body mass index is 24.95 kg/m.   Physical Exam:  General: Alert, interactive, in no acute distress. Very pleasant as always. Friendly. Eyes: No conjunctival injection present on the right, No conjunctival injection present on the left, PERRL bilaterally, No discharge  on the right, No discharge on the left and No Horner-Trantas dots present Ears: Right TM pearly gray with normal light reflex, Left TM pearly gray with normal light reflex, Right TM intact without perforation and Left TM intact without perforation.  Nose/Throat: External nose within normal limits, nasal crease present and septum midline, turbinates edematous with crusty discharge, post-pharynx erythematous with cobblestoning in the posterior oropharynx. Tonsils 2+ without exudates Neck: Supple without thyromegaly. Lungs: Decreased breath sounds bilaterally without wheezing, rhonchi or rales. No increased work of breathing. CV: Normal S1/S2, no murmurs. Capillary refill <2 seconds.  Skin: Warm and dry, without lesions or  rashes. Neuro:   Grossly intact. No focal deficits appreciated. Responsive to questions.   Diagnostic studies:   Spirometry: results normal (FEV1: 1.73/77%, FVC: 2.37/86%, FEV1/FVC: 72%).    Spirometry consistent with normal pattern.   Allergy Studies: none    Salvatore Marvel, MD [place] [facility] [place]

## 2016-09-27 LAB — IGG, IGA, IGM
IGA: 88 mg/dL (ref 81–463)
IGG (IMMUNOGLOBIN G), SERUM: 524 mg/dL — AB (ref 694–1618)
IGM, SERUM: 72 mg/dL (ref 48–271)

## 2016-09-28 LAB — HAEMOPHILIUS INFLUENZAE B AB IGG: Influenza B Virus Ab, IgG: 0.49 ug/mL — ABNORMAL LOW (ref >=1.00)

## 2016-09-28 LAB — DIPHTHERIA / TETANUS ANTIBODY PANEL
DIPHTHERIA AB: 0.55 [IU]/mL
TETANUS TOXIN ANTIBODY, TOTAL: 2.22 [IU]/mL (ref >0.15)

## 2016-09-29 LAB — STREP PNEUMONIAE 14 SEROTYPES IGG
STREP PNEUMONIAE TYPE 3 ABS: 25.2
STREP PNEUMONIAE TYPE 5 ABS: 81.4
Strep pneumo Type 12: 4.3
Strep pneumo Type 19: 14.8
Strep pneumo Type 4: 1.1
Strep pneumoniae Type 1 Abs: 1.1
Strep pneumoniae Type 14 Abs: 80
Strep pneumoniae Type 18C Abs: 7.1
Strep pneumoniae Type 23F Abs: 3.2
Strep pneumoniae Type 6B Abs: 79.5
Strep pneumoniae Type 7F Abs: 1.2
Strep pneumoniae Type 8 Abs: 17.9
Strep pneumoniae Type 9N Abs: 43

## 2016-10-04 ENCOUNTER — Other Ambulatory Visit: Payer: Self-pay | Admitting: *Deleted

## 2016-10-04 MED ORDER — EPINEPHRINE 0.3 MG/0.3ML IJ SOAJ: INTRAMUSCULAR | 1 refills | Status: DC

## 2016-10-04 NOTE — Telephone Encounter (Signed)
Patient called requesting a Epipen refill sent to pharmacy. Rx sent patient advised

## 2016-10-05 NOTE — Addendum Note (Signed)
Addended by: Felipa Emory on: 10/05/2016 10:12 AM   Modules accepted: Orders

## 2016-10-09 ENCOUNTER — Telehealth: Payer: Self-pay | Admitting: Nurse Practitioner

## 2016-10-09 ENCOUNTER — Other Ambulatory Visit: Payer: Self-pay | Admitting: Nurse Practitioner

## 2016-10-09 DIAGNOSIS — K30 Functional dyspepsia: Secondary | ICD-10-CM

## 2016-10-09 MED ORDER — FAMOTIDINE 20 MG PO TABS: 20.0000 mg | ORAL_TABLET | Freq: Two times a day (BID) | ORAL | 0 refills | Status: DC

## 2016-10-09 NOTE — Telephone Encounter (Signed)
Pt is needing a refill on her indigestion medication, please adivse, MS

## 2016-10-09 NOTE — Telephone Encounter (Signed)
Prescription sent. Needs office visit if no improvement in 2weeks

## 2016-10-14 DIAGNOSIS — Z79899 Other long term (current) drug therapy: Secondary | ICD-10-CM | POA: Diagnosis not present

## 2016-10-14 DIAGNOSIS — R51 Headache: Secondary | ICD-10-CM | POA: Diagnosis not present

## 2016-10-14 DIAGNOSIS — Z049 Encounter for examination and observation for unspecified reason: Secondary | ICD-10-CM | POA: Diagnosis not present

## 2016-10-14 DIAGNOSIS — G43719 Chronic migraine without aura, intractable, without status migrainosus: Secondary | ICD-10-CM | POA: Diagnosis not present

## 2016-10-15 LAB — LIPID PANEL
Cholesterol: 240 mg/dL — AB (ref 0–200)
HDL: 31 mg/dL — AB (ref 35–70)
TRIGLYCERIDES: 557 mg/dL — AB (ref 40–160)

## 2016-10-15 LAB — CBC AND DIFFERENTIAL
HCT: 35 % — AB (ref 36–46)
Hemoglobin: 11.5 g/dL — AB (ref 12.0–16.0)

## 2016-10-15 LAB — BASIC METABOLIC PANEL
BUN: 6 mg/dL (ref 4–21)
Glucose: 96 mg/dL
Potassium: 4.8 mmol/L (ref 3.4–5.3)
Sodium: 144 mmol/L (ref 137–147)

## 2016-10-15 LAB — HEPATIC FUNCTION PANEL
ALT: 14 U/L (ref 7–35)
AST: 11 U/L — AB (ref 13–35)
Alkaline Phosphatase: 102 U/L (ref 25–125)
Bilirubin, Total: 0.2 mg/dL

## 2016-10-17 DIAGNOSIS — J189 Pneumonia, unspecified organism: Secondary | ICD-10-CM

## 2016-10-17 HISTORY — DX: Pneumonia, unspecified organism: J18.9

## 2016-10-18 ENCOUNTER — Ambulatory Visit: Payer: Self-pay

## 2016-10-18 DIAGNOSIS — J309 Allergic rhinitis, unspecified: Secondary | ICD-10-CM

## 2016-10-19 ENCOUNTER — Encounter (INDEPENDENT_AMBULATORY_CARE_PROVIDER_SITE_OTHER): Payer: Self-pay

## 2016-10-19 ENCOUNTER — Ambulatory Visit (HOSPITAL_COMMUNITY)
Admission: RE | Admit: 2016-10-19 | Discharge: 2016-10-19 | Disposition: A | Discharge status: Home / Self Care | Payer: Medicare Other | Source: Ambulatory Visit | Attending: Family Medicine | Admitting: Family Medicine

## 2016-10-19 ENCOUNTER — Ambulatory Visit (INDEPENDENT_AMBULATORY_CARE_PROVIDER_SITE_OTHER): Payer: Medicare Other | Admitting: Family Medicine

## 2016-10-19 ENCOUNTER — Encounter: Payer: Self-pay | Admitting: Family Medicine

## 2016-10-19 VITALS — BP 136/78 | HR 95 | Temp 98.7°F | Resp 20 | Wt 137.8 lb

## 2016-10-19 DIAGNOSIS — R062 Wheezing: Secondary | ICD-10-CM

## 2016-10-19 DIAGNOSIS — J984 Other disorders of lung: Secondary | ICD-10-CM | POA: Diagnosis not present

## 2016-10-19 DIAGNOSIS — J4541 Moderate persistent asthma with (acute) exacerbation: Secondary | ICD-10-CM | POA: Diagnosis not present

## 2016-10-19 DIAGNOSIS — R0602 Shortness of breath: Secondary | ICD-10-CM | POA: Diagnosis not present

## 2016-10-19 DIAGNOSIS — F17200 Nicotine dependence, unspecified, uncomplicated: Secondary | ICD-10-CM

## 2016-10-19 DIAGNOSIS — R05 Cough: Secondary | ICD-10-CM | POA: Diagnosis not present

## 2016-10-19 DIAGNOSIS — J019 Acute sinusitis, unspecified: Secondary | ICD-10-CM

## 2016-10-19 DIAGNOSIS — IMO0001 Reserved for inherently not codable concepts without codable children: Secondary | ICD-10-CM

## 2016-10-19 DIAGNOSIS — R079 Chest pain, unspecified: Secondary | ICD-10-CM | POA: Diagnosis not present

## 2016-10-19 MED ORDER — PREDNISONE 20 MG PO TABS: ORAL_TABLET | ORAL | 0 refills | Status: DC

## 2016-10-19 MED ORDER — IPRATROPIUM-ALBUTEROL 0.5-2.5 (3) MG/3ML IN SOLN: 3.0000 mL | Freq: Once | RESPIRATORY_TRACT | Status: DC

## 2016-10-19 MED ORDER — DOXYCYCLINE HYCLATE 100 MG PO TABS: 100.0000 mg | ORAL_TABLET | Freq: Two times a day (BID) | ORAL | 0 refills | Status: DC

## 2016-10-19 MED ORDER — ALBUTEROL SULFATE HFA 108 (90 BASE) MCG/ACT IN AERS: 2.0000 | INHALATION_SPRAY | Freq: Four times a day (QID) | RESPIRATORY_TRACT | 3 refills | Status: DC | PRN

## 2016-10-19 MED ORDER — GUAIFENESIN-CODEINE 100-10 MG/5ML PO SYRP: 5.0000 mL | ORAL_SOLUTION | Freq: Three times a day (TID) | ORAL | 0 refills | Status: DC | PRN

## 2016-10-19 NOTE — Patient Instructions (Addendum)
You have sinusitis and asthma flare. Albuterol and atrovent nebulization treatment provided in office today.  You need to quit smoking to help decrease your risk of recurrent infections. Treat infection with prednisone steroid course, doxycycline antibiotic, and cheratussin codeine cough syrup. Use your albuterol rescue inhaler 2 puffs every 4-6 hours when you feel wheezy or short winded. I have refilled this for you.  Buy plain mucinex with plenty of fluid to help mobilize mucous out.  Please return for further evaluation or seek urgent care if fever >101, worsening productive cough, or worsening shortness of breath.  Nice to meet you today.

## 2016-10-19 NOTE — Assessment & Plan Note (Signed)
Treat aggressively with doxy antibiotic course given comorbidities and symptoms

## 2016-10-19 NOTE — Progress Notes (Addendum)
BP 136/78   Pulse 95   Temp 98.7 F (37.1 C) (Oral)   Resp 20   Wt 137 lb 12 oz (62.5 kg)   SpO2 95%   BMI 25.61 kg/m    CC: cough, congestion Subjective:    Patient ID: Lori [name], female    DOB: 12/02/1955, 61 y.o.   MRN: [id number]  HPI: Lori [name] is a 61 y.o. female presenting on 10/19/2016 for Cough; Nasal Congestion; Sore Throat; Generalized Body Aches; and Headache   Patient presents today with 5d h/o cough, congestion (head and chest), ear pressure, ST, PNdrainage. + wheezing and dyspnea. Had asthma attack this morning. Some feverish but no documented fevers.   She states she has run out of albuterol inhaler "for years".  Husband sick at home as well.   Tried multiple over the counter medications without improvement.   Known moderate persistent asthma, chronic allergic rhinitis, acute recurrent sinusitis and current smoker.  She is managed on arnuity 217mcg (endorses compliance with this BID), proair PRN, nasacort, zyrtec and singulair. She also takes allergy shots.  Recent treatment by allergist last month with augmentin 14d course.  Plan per allergist was to consider sinus CT and ENT referral if recurrent sinusitis.   Relevant past medical, surgical, family and social history reviewed and updated as indicated. Interim medical history since our last visit reviewed. Allergies and medications reviewed and updated. Current Outpatient Prescriptions on File Prior to Visit  Medication Sig  . acetaminophen (TYLENOL) 500 MG tablet Take 2 tablets (1,000 mg total) by mouth every 6 (six) hours as needed.  . citalopram (CELEXA) 20 MG tablet Take 1 tablet (20 mg total) by mouth daily. --this rx replaces 07/19/16 rx  . cyclobenzaprine (FLEXERIL) 5 MG tablet TAKE 1 TABLET(5 MG) BY MOUTH TWICE DAILY AS NEEDED FOR MUSCLE SPASMS  . DEXILANT 60 MG capsule Take 1 capsule (60 mg total) by mouth daily. Before breakfast  . dextromethorphan-guaiFENesin (MUCINEX DM) 30-600 MG 12hr  tablet Take 1 tablet by mouth 2 (two) times daily.  Marland Kitchen EPINEPHrine 0.3 mg/0.3 mL IJ SOAJ injection Use as directed for severe allergic reaction  . famotidine (PEPCID) 20 MG tablet Take 1 tablet (20 mg total) by mouth 2 (two) times daily.  . fenofibrate (TRICOR) 145 MG tablet   . fluticasone (FLONASE) 50 MCG/ACT nasal spray Place 2 sprays into both nostrils daily.  . Fluticasone Furoate (ARNUITY ELLIPTA) 200 MCG/ACT AEPB Inhale 1 puff into the lungs daily.  Marland Kitchen loperamide (IMODIUM) 2 MG capsule Take 1 capsule (2 mg total) by mouth 4 (four) times daily as needed for diarrhea or loose stools.  . meclizine (ANTIVERT) 25 MG tablet Take 1 tablet (25 mg total) by mouth 3 (three) times daily. Only as needed for DIZZINESS  . montelukast (SINGULAIR) 10 MG tablet Take 1 tablet (10 mg total) by mouth daily.  . nitroGLYCERIN (NITROSTAT) 0.4 MG SL tablet Place 1 tablet (0.4 mg total) under the tongue every 5 (five) minutes as needed for chest pain.  Marland Kitchen ondansetron (ZOFRAN) 4 MG tablet One tab 30 mintues before each bowel prep  . simvastatin (ZOCOR) 20 MG tablet TAKE 1 TABLET BY MOUTH EVERY EVENING BEFORE BEDTIME FOR CHOLESTEROL  . traZODone (DESYREL) 50 MG tablet   . valACYclovir (VALTREX) 500 MG tablet take 1 tablet by motuh twice per day   Current Facility-Administered Medications on File Prior to Visit  Medication  . 0.9 %  sodium chloride infusion    Review of Systems  Per HPI unless specifically indicated in ROS section     Objective:    BP 136/78   Pulse 95   Temp 98.7 F (37.1 C) (Oral)   Resp 20   Wt 137 lb 12 oz (62.5 kg)   SpO2 95%   BMI 25.61 kg/m   Wt Readings from Last 3 Encounters:  10/19/16 137 lb 12 oz (62.5 kg)  09/26/16 134 lb 3.2 oz (60.9 kg)  08/30/16 131 lb (59.4 kg)    Physical Exam  Constitutional: She appears well-developed and well-nourished. No distress.  HENT:  Head: Normocephalic and atraumatic.  Right Ear: Hearing, external ear and ear canal normal.  Left Ear:  Hearing, external ear and ear canal normal.  Nose: Mucosal edema present. No rhinorrhea. Right sinus exhibits maxillary sinus tenderness and frontal sinus tenderness. Left sinus exhibits maxillary sinus tenderness and frontal sinus tenderness.  Mouth/Throat: Uvula is midline and mucous membranes are normal. Posterior oropharyngeal erythema present. No oropharyngeal exudate, posterior oropharyngeal edema or tonsillar abscesses.  Bilateral erythematous congested TMs  Nasal mucosal inflammation   Eyes: Conjunctivae and EOM are normal. Pupils are equal, round, and reactive to light. No scleral icterus.  Neck: Normal range of motion. Neck supple.  Cardiovascular: Normal rate, regular rhythm, normal heart sounds and intact distal pulses.   No murmur heard. Pulmonary/Chest: She is in respiratory distress (shallow breathing). She has decreased breath sounds. She has wheezes (polysymphonic). She has rhonchi. She has no rales.  Lymphadenopathy:    She has no cervical adenopathy.  Skin: Skin is warm and dry. No rash noted.  Nursing note and vitals reviewed.  After albuterol/atrovent neb: No subjective improvement, but improved air movement and mildly decreased wheezing    Assessment & Plan:   Problem List Items Addressed This Visit    Acute sinusitis    Treat aggressively with doxy antibiotic course given comorbidities and symptoms      Relevant Medications   guaiFENesin-codeine (CHERATUSSIN AC) 100-10 MG/5ML syrup   predniSONE (DELTASONE) 20 MG tablet   doxycycline (VIBRA-TABS) 100 MG tablet   Other Relevant Orders   DG Chest 2 View   Moderate persistent asthma with acute exacerbation - Primary    vs COPD exacerbation.  Treat with prednisone course, doxycycline antibiotic.  Stressed importance of smoking cessation.  States tessalon perls ineffective, she is able to take codeine if taken with food and in syrup form despite listed allergy.  Not taking albuterol. Refilled and discussed rescue  medication use.  [facility].       Relevant Medications   ipratropium-albuterol (DUONEB) 0.5-2.5 (3) MG/3ML nebulizer solution 3 mL   predniSONE (DELTASONE) 20 MG tablet   albuterol (PROVENTIL HFA;VENTOLIN HFA) 108 (90 Base) MCG/ACT inhaler   Other Relevant Orders   DG Chest 2 View   Smoking (Chronic)    Continue to encourage smoking cessation, discussed relation of smoking to her respiratory illnesses.       Relevant Orders   DG Chest 2 View    Other Visit Diagnoses    Wheezing       Relevant Medications   ipratropium-albuterol (DUONEB) 0.5-2.5 (3) MG/3ML nebulizer solution 3 mL   Other Relevant Orders   DG Chest 2 View       Follow up plan: Return if symptoms worsen or fail to improve.  Ria Bush, MD

## 2016-10-19 NOTE — Progress Notes (Signed)
Pre visit review using our clinic review tool, if applicable. No additional management support is needed unless otherwise documented below in the visit note. 

## 2016-10-19 NOTE — Assessment & Plan Note (Addendum)
vs COPD exacerbation.  Treat with prednisone course, doxycycline antibiotic.  Stressed importance of smoking cessation.  States tessalon perls ineffective, she is able to take codeine if taken with food and in syrup form despite listed allergy.  Not taking albuterol. Refilled and discussed rescue medication use.  Check Xray at [facility] today.

## 2016-10-19 NOTE — Assessment & Plan Note (Signed)
Continue to encourage smoking cessation, discussed relation of smoking to her respiratory illnesses.

## 2016-10-21 ENCOUNTER — Ambulatory Visit (INDEPENDENT_AMBULATORY_CARE_PROVIDER_SITE_OTHER): Payer: Medicare Other | Admitting: Nurse Practitioner

## 2016-10-21 ENCOUNTER — Other Ambulatory Visit (INDEPENDENT_AMBULATORY_CARE_PROVIDER_SITE_OTHER): Payer: Medicare Other

## 2016-10-21 ENCOUNTER — Other Ambulatory Visit: Payer: Self-pay | Admitting: *Deleted

## 2016-10-21 ENCOUNTER — Encounter: Payer: Self-pay | Admitting: Nurse Practitioner

## 2016-10-21 VITALS — BP 110/78 | HR 81 | Temp 98.4°F | Wt 138.0 lb

## 2016-10-21 DIAGNOSIS — J3089 Other allergic rhinitis: Secondary | ICD-10-CM

## 2016-10-21 DIAGNOSIS — E782 Mixed hyperlipidemia: Secondary | ICD-10-CM | POA: Diagnosis not present

## 2016-10-21 DIAGNOSIS — F339 Major depressive disorder, recurrent, unspecified: Secondary | ICD-10-CM

## 2016-10-21 DIAGNOSIS — K2100 Gastro-esophageal reflux disease with esophagitis, without bleeding: Secondary | ICD-10-CM

## 2016-10-21 DIAGNOSIS — D72829 Elevated white blood cell count, unspecified: Secondary | ICD-10-CM | POA: Diagnosis not present

## 2016-10-21 DIAGNOSIS — F419 Anxiety disorder, unspecified: Secondary | ICD-10-CM

## 2016-10-21 DIAGNOSIS — M62830 Muscle spasm of back: Secondary | ICD-10-CM

## 2016-10-21 DIAGNOSIS — K3184 Gastroparesis: Secondary | ICD-10-CM

## 2016-10-21 DIAGNOSIS — R42 Dizziness and giddiness: Secondary | ICD-10-CM

## 2016-10-21 LAB — CBC WITH DIFFERENTIAL/PLATELET
BASOS PCT: 1.2 % (ref 0.0–3.0)
Basophils Absolute: 0.1 10*3/uL (ref 0.0–0.1)
EOS ABS: 0.2 10*3/uL (ref 0.0–0.7)
Eosinophils Relative: 2.9 % (ref 0.0–5.0)
HEMATOCRIT: 38.9 % (ref 36.0–46.0)
HEMOGLOBIN: 13.1 g/dL (ref 12.0–15.0)
Lymphocytes Relative: 44 % (ref 12.0–46.0)
Lymphs Abs: 3.4 10*3/uL (ref 0.7–4.0)
MCHC: 33.7 g/dL (ref 30.0–36.0)
MCV: 84.5 fl (ref 78.0–100.0)
Monocytes Absolute: 0.6 10*3/uL (ref 0.1–1.0)
Monocytes Relative: 7.9 % (ref 3.0–12.0)
Neutro Abs: 3.4 10*3/uL (ref 1.4–7.7)
Neutrophils Relative %: 44 % (ref 43.0–77.0)
Platelets: 309 10*3/uL (ref 150.0–400.0)
RBC: 4.6 Mil/uL (ref 3.87–5.11)
RDW: 14.2 % (ref 11.5–15.5)
WBC: 7.7 10*3/uL (ref 4.0–10.5)

## 2016-10-21 LAB — LIPID PANEL
CHOL/HDL RATIO: 8
Cholesterol: 260 mg/dL — ABNORMAL HIGH (ref 0–200)
HDL: 31.8 mg/dL — AB (ref >39.00)
NONHDL: 227.97
TRIGLYCERIDES: 245 mg/dL — AB (ref 0.0–149.0)
VLDL: 49 mg/dL — ABNORMAL HIGH (ref 0.0–40.0)

## 2016-10-21 LAB — LDL CHOLESTEROL, DIRECT: Direct LDL: 187 mg/dL

## 2016-10-21 MED ORDER — LOPERAMIDE HCL 2 MG PO CAPS: 2.0000 mg | ORAL_CAPSULE | ORAL | 0 refills | Status: DC | PRN

## 2016-10-21 MED ORDER — CYCLOBENZAPRINE HCL 5 MG PO TABS: 5.0000 mg | ORAL_TABLET | Freq: Three times a day (TID) | ORAL | 0 refills | Status: DC | PRN

## 2016-10-21 MED ORDER — MOMETASONE FURO-FORMOTEROL FUM 200-5 MCG/ACT IN AERO: 2.0000 | INHALATION_SPRAY | Freq: Two times a day (BID) | RESPIRATORY_TRACT | 3 refills | Status: DC

## 2016-10-21 MED ORDER — MECLIZINE HCL 25 MG PO TABS: 25.0000 mg | ORAL_TABLET | Freq: Three times a day (TID) | ORAL | 0 refills | Status: DC | PRN

## 2016-10-21 MED ORDER — DEXLANSOPRAZOLE 60 MG PO CPDR: 60.0000 mg | DELAYED_RELEASE_CAPSULE | Freq: Every day | ORAL | 3 refills | Status: DC

## 2016-10-21 MED ORDER — ONDANSETRON HCL 4 MG PO TABS: 4.0000 mg | ORAL_TABLET | Freq: Three times a day (TID) | ORAL | 0 refills | Status: DC | PRN

## 2016-10-21 MED ORDER — CITALOPRAM HYDROBROMIDE 20 MG PO TABS: 20.0000 mg | ORAL_TABLET | Freq: Every day | ORAL | 0 refills | Status: DC

## 2016-10-21 NOTE — Progress Notes (Signed)
Subjective:  Patient ID: Lori [name], female    DOB: [birth year]  Age: 61 y.o. MRN: [id number]  CC: Follow-up   HPI  Hyperlipidemia: Tolerating medications.  Anxiety: Stable with use of celexa.    GERD: Stable with dexilant.  Outpatient Medications Prior to Visit  Medication Sig Dispense Refill  . acetaminophen (TYLENOL) 500 MG tablet Take 2 tablets (1,000 mg total) by mouth every 6 (six) hours as needed. 30 tablet 0  . albuterol (PROVENTIL HFA;VENTOLIN HFA) 108 (90 Base) MCG/ACT inhaler Inhale 2 puffs into the lungs every 6 (six) hours as needed for wheezing or shortness of breath. 1 Inhaler 3  . EPINEPHrine 0.3 mg/0.3 mL IJ SOAJ injection Use as directed for severe allergic reaction 2 Device 1  . fenofibrate (TRICOR) 145 MG tablet     . fluticasone (FLONASE) 50 MCG/ACT nasal spray Place 2 sprays into both nostrils daily. 16 g 0  . nitroGLYCERIN (NITROSTAT) 0.4 MG SL tablet Place 1 tablet (0.4 mg total) under the tongue every 5 (five) minutes as needed for chest pain. 25 tablet 3  . citalopram (CELEXA) 20 MG tablet Take 1 tablet (20 mg total) by mouth daily. --this rx replaces 07/19/16 rx 90 tablet 0  . cyclobenzaprine (FLEXERIL) 5 MG tablet TAKE 1 TABLET(5 MG) BY MOUTH TWICE DAILY AS NEEDED FOR MUSCLE SPASMS 30 tablet 1  . DEXILANT 60 MG capsule Take 1 capsule (60 mg total) by mouth daily. Before breakfast 30 capsule 3  . dextromethorphan-guaiFENesin (MUCINEX DM) 30-600 MG 12hr tablet Take 1 tablet by mouth 2 (two) times daily. 20 tablet 0  . doxycycline (VIBRA-TABS) 100 MG tablet Take 1 tablet (100 mg total) by mouth 2 (two) times daily. 20 tablet 0  . famotidine (PEPCID) 20 MG tablet Take 1 tablet (20 mg total) by mouth 2 (two) times daily. 30 tablet 0  . Fluticasone Furoate (ARNUITY ELLIPTA) 200 MCG/ACT AEPB Inhale 1 puff into the lungs daily. 30 each 5  . guaiFENesin-codeine (CHERATUSSIN AC) 100-10 MG/5ML syrup Take 5 mLs by mouth 3 (three) times daily as needed for cough  (sedation precautions). 140 mL 0  . loperamide (IMODIUM) 2 MG capsule Take 1 capsule (2 mg total) by mouth 4 (four) times daily as needed for diarrhea or loose stools. 12 capsule 0  . meclizine (ANTIVERT) 25 MG tablet Take 1 tablet (25 mg total) by mouth 3 (three) times daily. Only as needed for DIZZINESS 21 tablet 0  . montelukast (SINGULAIR) 10 MG tablet Take 1 tablet (10 mg total) by mouth daily. 30 tablet 5  . ondansetron (ZOFRAN) 4 MG tablet One tab 30 mintues before each bowel prep 2 tablet 0  . predniSONE (DELTASONE) 20 MG tablet Take two tablets daily for 3 days followed by one tablet daily for 4 days 10 tablet 0  . simvastatin (ZOCOR) 20 MG tablet TAKE 1 TABLET BY MOUTH EVERY EVENING BEFORE BEDTIME FOR CHOLESTEROL 30 tablet 4  . traZODone (DESYREL) 50 MG tablet   2  . valACYclovir (VALTREX) 500 MG tablet take 1 tablet by motuh twice per day  5   Facility-Administered Medications Prior to Visit  Medication Dose Route Frequency Provider Last Rate Last Dose  . 0.9 %  sodium chloride infusion  500 mL Intravenous Continuous Nelida Meuse III, MD      . ipratropium-albuterol (DUONEB) 0.5-2.5 (3) MG/3ML nebulizer solution 3 mL  3 mL Nebulization Once Ria Bush, MD        ROS See HPI  Objective:  BP 110/78   Pulse 81   Temp 98.4 F (36.9 C)   Wt 138 lb (62.6 kg)   SpO2 96%   BMI 25.65 kg/m   BP Readings from Last 3 Encounters:  10/21/16 110/78  10/19/16 136/78  09/26/16 118/72    Wt Readings from Last 3 Encounters:  10/21/16 138 lb (62.6 kg)  10/19/16 137 lb 12 oz (62.5 kg)  09/26/16 134 lb 3.2 oz (60.9 kg)    Physical Exam  Constitutional: She is oriented to person, place, and time. No distress.  HENT:  Right Ear: External ear normal.  Left Ear: External ear normal.  Nose: Nose normal.  Mouth/Throat: No oropharyngeal exudate.  Eyes: No scleral icterus.  Neck: Normal range of motion. Neck supple.  Cardiovascular: Normal rate, regular rhythm and normal heart  sounds.   Pulmonary/Chest: Effort normal and breath sounds normal. No respiratory distress.  Abdominal: Soft. Bowel sounds are normal. There is no tenderness.  Musculoskeletal: Normal range of motion. She exhibits no edema.  Lymphadenopathy:    She has no cervical adenopathy.  Neurological: She is alert and oriented to person, place, and time.  Skin: Skin is warm and dry.  Psychiatric: She has a normal mood and affect. Her behavior is normal.    Lab Results  Component Value Date   WBC 7.7 10/21/2016   HGB 13.1 10/21/2016   HCT 38.9 10/21/2016   PLT 309.0 10/21/2016   GLUCOSE 105 (H) 10/15/2015   CHOL 260 (H) 10/21/2016   TRIG 245.0 (H) 10/21/2016   HDL 31.80 (L) 10/21/2016   LDLDIRECT 187.0 10/21/2016   ALT 11 (L) 10/15/2015   AST 19 10/15/2015   NA 136 10/15/2015   K 3.3 (L) 10/15/2015   CL 104 10/15/2015   CREATININE 0.86 10/15/2015   BUN 9 10/15/2015   CO2 19 (L) 10/15/2015   TSH 1.74 06/21/2016   INR 0.9 08/31/2008   HGBA1C 5.5 06/21/2016    Dg Chest 2 View  Result Date: 10/19/2016 CLINICAL DATA:  Asthma, chest pain and shortness of breath. EXAM: CHEST  2 VIEW COMPARISON:  10/15/2015 FINDINGS: The heart size and mediastinal contours are within normal limits. Stable chronic lung disease. No significant hyperinflation. There is no evidence of pulmonary edema, consolidation, pneumothorax, nodule or pleural fluid. The visualized skeletal structures are unremarkable. IMPRESSION: Stable chronic lung disease.  No acute findings. Electronically Signed   By: Aletta Edouard M.D.   On: 10/19/2016 14:09    Assessment & Plan:   Rajvi was seen today for follow-up.  Diagnoses and all orders for this visit:  Mixed hyperlipidemia -     Cancel: Lipid panel; Future -     Lipid panel; Future -     Ambulatory referral to Lipid Clinic  Episode of recurrent major depressive disorder, unspecified depression episode severity (HCC)  Anxiety -     citalopram (CELEXA) 20 MG tablet; Take  1 tablet (20 mg total) by mouth daily. --this rx replaces 07/19/16 rx  Recurrent major depressive disorder, remission status unspecified ([place]) -     citalopram (CELEXA) 20 MG tablet; Take 1 tablet (20 mg total) by mouth daily. --this rx replaces 07/19/16 rx  Gastroesophageal reflux disease with esophagitis -     dexlansoprazole (DEXILANT) 60 MG capsule; Take 1 capsule (60 mg total) by mouth daily.  Spasm of lumbar paraspinous muscle -     cyclobenzaprine (FLEXERIL) 5 MG tablet; Take 1 tablet (5 mg total) by mouth 3 (three) times daily  as needed for muscle spasms.  Vertigo -     meclizine (ANTIVERT) 25 MG tablet; Take 1 tablet (25 mg total) by mouth 3 (three) times daily as needed for dizziness. Only as needed for DIZZINESS  Gastroparesis -     ondansetron (ZOFRAN) 4 MG tablet; Take 1 tablet (4 mg total) by mouth every 8 (eight) hours as needed for nausea or vomiting. One tab 30 mintues before each bowel prep -     loperamide (IMODIUM) 2 MG capsule; Take 1 capsule (2 mg total) by mouth as needed for diarrhea or loose stools.  Chronic nonseasonal allergic rhinitis due to other allergen  Other orders -     Cancel: Ambulatory referral to Gastroenterology -     Cancel: Arthritis Panel; Future -     Discontinue: mometasone-formoterol (DULERA) 200-5 MCG/ACT AERO; Inhale 2 puffs into the lungs 2 times daily at 12 noon and 4 pm.   I have discontinued Ms. Nyborg's valACYclovir, DEXILANT, dextromethorphan-guaiFENesin, traZODone, famotidine, guaiFENesin-codeine, predniSONE, doxycycline, FLUoxetine, mometasone-formoterol, and lisinopril-hydrochlorothiazide. I have also changed her ondansetron, loperamide, meclizine, and cyclobenzaprine. Additionally, I am having her start on dexlansoprazole. Lastly, I am having her maintain her nitroGLYCERIN, acetaminophen, fluticasone, fenofibrate, EPINEPHrine, albuterol, montelukast, simvastatin, Fluticasone Furoate, gabapentin, and citalopram. We will continue to  administer sodium chloride and ipratropium-albuterol.  Meds ordered this encounter  Medications  . DISCONTD: FLUoxetine (PROZAC) 20 MG tablet    Sig: Take 1 tablet (20 mg total) by mouth daily.    Dispense:  30 tablet    Refill:  3    Order Specific Question:   Supervising Provider    Answer:   Cassandria Anger [[id number]]  . DISCONTD: mometasone-formoterol (DULERA) 200-5 MCG/ACT AERO    Sig: Inhale 2 puffs into the lungs 2 times daily at 12 noon and 4 pm.    Dispense:  1 Inhaler    Refill:  3    Order Specific Question:   Supervising Provider    Answer:   Cassandria Anger [[id number]]  . DISCONTD: lisinopril-hydrochlorothiazide (PRINZIDE,ZESTORETIC) 10-12.5 MG tablet    Sig: Take 1 tablet by mouth daily.    Dispense:  90 tablet    Refill:  3    Order Specific Question:   Supervising Provider    Answer:   Cassandria Anger [[id number]]  . montelukast (SINGULAIR) 10 MG tablet    Sig: Take 1 tablet (10 mg total) by mouth daily.    Dispense:  30 tablet    Refill:  5    Order Specific Question:   Supervising Provider    Answer:   Cassandria Anger [[id number]]  . ondansetron (ZOFRAN) 4 MG tablet    Sig: Take 1 tablet (4 mg total) by mouth every 8 (eight) hours as needed for nausea or vomiting. One tab 30 mintues before each bowel prep    Dispense:  20 tablet    Refill:  0    Order Specific Question:   Supervising Provider    Answer:   Cassandria Anger [[id number]]  . simvastatin (ZOCOR) 20 MG tablet    Sig: TAKE 1 TABLET BY MOUTH EVERY EVENING BEFORE BEDTIME FOR CHOLESTEROL    Dispense:  30 tablet    Refill:  4    Order Specific Question:   Supervising Provider    Answer:   Cassandria Anger [[id number]]  . Fluticasone Furoate (ARNUITY ELLIPTA) 200 MCG/ACT AEPB    Sig: Inhale 1 puff into the lungs daily.    Dispense:  30 each    Refill:  5    Order Specific Question:   Supervising Provider    Answer:   Cassandria Anger [[id number]]  . loperamide (IMODIUM) 2 MG capsule    Sig: Take 1  capsule (2 mg total) by mouth as needed for diarrhea or loose stools.    Dispense:  12 capsule    Refill:  0    Order Specific Question:   Supervising Provider    Answer:   Cassandria Anger [[id number]]  . meclizine (ANTIVERT) 25 MG tablet    Sig: Take 1 tablet (25 mg total) by mouth 3 (three) times daily as needed for dizziness. Only as needed for DIZZINESS    Dispense:  21 tablet    Refill:  0    Order Specific Question:   Supervising Provider    Answer:   Cassandria Anger [[id number]]  . DISCONTD: citalopram (CELEXA) 20 MG tablet    Sig: Take 1 tablet (20 mg total) by mouth daily. --this rx replaces 07/19/16 rx    Dispense:  90 tablet    Refill:  0    **Patient requests 90 days supply**    Order Specific Question:   Supervising Provider    Answer:   Cassandria Anger [[id number]]  . gabapentin (NEURONTIN) 100 MG capsule    Sig: Take 1 capsule (100 mg total) by mouth as directed.    Dispense:  90 capsule    Refill:  3    Order Specific Question:   Supervising Provider    Answer:   Cassandria Anger [[id number]]  . citalopram (CELEXA) 20 MG tablet    Sig: Take 1 tablet (20 mg total) by mouth daily. --this rx replaces 07/19/16 rx    Dispense:  90 tablet    Refill:  0    **Patient requests 90 days supply**    Order Specific Question:   Supervising Provider    Answer:   Cassandria Anger [[id number]]  . dexlansoprazole (DEXILANT) 60 MG capsule    Sig: Take 1 capsule (60 mg total) by mouth daily.    Dispense:  30 capsule    Refill:  3    Order Specific Question:   Supervising Provider    Answer:   Cassandria Anger [[id number]]  . cyclobenzaprine (FLEXERIL) 5 MG tablet    Sig: Take 1 tablet (5 mg total) by mouth 3 (three) times daily as needed for muscle spasms.    Dispense:  30 tablet    Refill:  0    Order Specific Question:   Supervising Provider    Answer:   Cassandria Anger [[id number]]    Follow-up: Return in about 3 months (around 01/18/2017) for hyperlipidemia and depression.  Wilfred Lacy, NP

## 2016-10-21 NOTE — Telephone Encounter (Signed)
Pt had left msg on triage stating she forgot to get refill on her Dexilant. Per chart Baldo Ash has already refilled and sent to [facility]...Johny Chess

## 2016-10-21 NOTE — Patient Instructions (Signed)
Use inhalers as prescribed  I strongly recommend tobacco cessation.

## 2016-10-22 ENCOUNTER — Telehealth: Payer: Self-pay | Admitting: Emergency Medicine

## 2016-10-22 DIAGNOSIS — R42 Dizziness and giddiness: Secondary | ICD-10-CM | POA: Insufficient documentation

## 2016-10-22 DIAGNOSIS — K2100 Gastro-esophageal reflux disease with esophagitis, without bleeding: Secondary | ICD-10-CM | POA: Insufficient documentation

## 2016-10-22 MED ORDER — BENZONATATE 100 MG PO CAPS: 100.0000 mg | ORAL_CAPSULE | Freq: Three times a day (TID) | ORAL | 0 refills | Status: DC | PRN

## 2016-10-22 NOTE — Addendum Note (Signed)
Addended by: Leana Gamer on: 10/22/2016 05:22 PM   Modules accepted: Orders

## 2016-10-22 NOTE — Telephone Encounter (Signed)
Pt called and stated she is coughing really bad. She wants to know if you can call her in something or if she needs to make an appt. Please advise thanks.

## 2016-10-22 NOTE — Assessment & Plan Note (Signed)
Referral to lipid clinic due to persistent elevated triglyceride.  Lipid Panel     Component Value Date/Time   CHOL 260 (H) 10/21/2016 1012   TRIG 245.0 (H) 10/21/2016 1012   HDL 31.80 (L) 10/21/2016 1012   CHOLHDL 8 10/21/2016 1012   VLDL 49.0 (H) 10/21/2016 1012   LDLDIRECT 187.0 10/21/2016 1012

## 2016-10-22 NOTE — Telephone Encounter (Signed)
Please advise 

## 2016-10-23 DIAGNOSIS — M791 Myalgia: Secondary | ICD-10-CM | POA: Diagnosis not present

## 2016-10-23 DIAGNOSIS — R51 Headache: Secondary | ICD-10-CM | POA: Diagnosis not present

## 2016-10-23 DIAGNOSIS — M542 Cervicalgia: Secondary | ICD-10-CM | POA: Diagnosis not present

## 2016-10-23 DIAGNOSIS — G43719 Chronic migraine without aura, intractable, without status migrainosus: Secondary | ICD-10-CM | POA: Diagnosis not present

## 2016-10-24 ENCOUNTER — Ambulatory Visit (INDEPENDENT_AMBULATORY_CARE_PROVIDER_SITE_OTHER): Payer: Medicare Other

## 2016-10-24 DIAGNOSIS — J309 Allergic rhinitis, unspecified: Secondary | ICD-10-CM

## 2016-10-26 ENCOUNTER — Encounter (HOSPITAL_COMMUNITY): Payer: Self-pay | Admitting: Emergency Medicine

## 2016-10-26 ENCOUNTER — Emergency Department (HOSPITAL_COMMUNITY): Payer: Medicare Other

## 2016-10-26 ENCOUNTER — Inpatient Hospital Stay (HOSPITAL_COMMUNITY)
Admission: EM | Admit: 2016-10-26 | Discharge: 2016-10-29 | DRG: 190 | Disposition: A | Discharge status: Home / Self Care | Payer: Medicare Other | Attending: Internal Medicine | Admitting: Internal Medicine

## 2016-10-26 DIAGNOSIS — Z8249 Family history of ischemic heart disease and other diseases of the circulatory system: Secondary | ICD-10-CM

## 2016-10-26 DIAGNOSIS — J441 Chronic obstructive pulmonary disease with (acute) exacerbation: Secondary | ICD-10-CM | POA: Diagnosis present

## 2016-10-26 DIAGNOSIS — Z881 Allergy status to other antibiotic agents status: Secondary | ICD-10-CM | POA: Diagnosis not present

## 2016-10-26 DIAGNOSIS — R059 Cough, unspecified: Secondary | ICD-10-CM

## 2016-10-26 DIAGNOSIS — Z888 Allergy status to other drugs, medicaments and biological substances status: Secondary | ICD-10-CM

## 2016-10-26 DIAGNOSIS — Z825 Family history of asthma and other chronic lower respiratory diseases: Secondary | ICD-10-CM | POA: Diagnosis not present

## 2016-10-26 DIAGNOSIS — R0902 Hypoxemia: Secondary | ICD-10-CM | POA: Diagnosis present

## 2016-10-26 DIAGNOSIS — J440 Chronic obstructive pulmonary disease with acute lower respiratory infection: Principal | ICD-10-CM | POA: Diagnosis present

## 2016-10-26 DIAGNOSIS — F419 Anxiety disorder, unspecified: Secondary | ICD-10-CM | POA: Diagnosis present

## 2016-10-26 DIAGNOSIS — K449 Diaphragmatic hernia without obstruction or gangrene: Secondary | ICD-10-CM | POA: Diagnosis not present

## 2016-10-26 DIAGNOSIS — Z91040 Latex allergy status: Secondary | ICD-10-CM

## 2016-10-26 DIAGNOSIS — R05 Cough: Secondary | ICD-10-CM

## 2016-10-26 DIAGNOSIS — R112 Nausea with vomiting, unspecified: Secondary | ICD-10-CM | POA: Diagnosis not present

## 2016-10-26 DIAGNOSIS — J189 Pneumonia, unspecified organism: Secondary | ICD-10-CM | POA: Diagnosis present

## 2016-10-26 DIAGNOSIS — J181 Lobar pneumonia, unspecified organism: Secondary | ICD-10-CM | POA: Diagnosis not present

## 2016-10-26 DIAGNOSIS — G629 Polyneuropathy, unspecified: Secondary | ICD-10-CM | POA: Diagnosis not present

## 2016-10-26 DIAGNOSIS — J4551 Severe persistent asthma with (acute) exacerbation: Secondary | ICD-10-CM | POA: Diagnosis not present

## 2016-10-26 DIAGNOSIS — Z79899 Other long term (current) drug therapy: Secondary | ICD-10-CM

## 2016-10-26 DIAGNOSIS — K219 Gastro-esophageal reflux disease without esophagitis: Secondary | ICD-10-CM

## 2016-10-26 DIAGNOSIS — I252 Old myocardial infarction: Secondary | ICD-10-CM | POA: Diagnosis not present

## 2016-10-26 DIAGNOSIS — R079 Chest pain, unspecified: Secondary | ICD-10-CM | POA: Diagnosis not present

## 2016-10-26 DIAGNOSIS — Z885 Allergy status to narcotic agent status: Secondary | ICD-10-CM | POA: Diagnosis not present

## 2016-10-26 DIAGNOSIS — Z91030 Bee allergy status: Secondary | ICD-10-CM

## 2016-10-26 DIAGNOSIS — Z90710 Acquired absence of both cervix and uterus: Secondary | ICD-10-CM

## 2016-10-26 DIAGNOSIS — F17210 Nicotine dependence, cigarettes, uncomplicated: Secondary | ICD-10-CM | POA: Diagnosis present

## 2016-10-26 DIAGNOSIS — I2510 Atherosclerotic heart disease of native coronary artery without angina pectoris: Secondary | ICD-10-CM | POA: Diagnosis present

## 2016-10-26 DIAGNOSIS — F411 Generalized anxiety disorder: Secondary | ICD-10-CM | POA: Diagnosis not present

## 2016-10-26 DIAGNOSIS — Z720 Tobacco use: Secondary | ICD-10-CM | POA: Diagnosis not present

## 2016-10-26 LAB — COMPREHENSIVE METABOLIC PANEL
ALT: 29 U/L (ref 14–54)
ANION GAP: 7 (ref 5–15)
AST: 15 U/L (ref 15–41)
Albumin: 3.9 g/dL (ref 3.5–5.0)
Alkaline Phosphatase: 114 U/L (ref 38–126)
BUN: 13 mg/dL (ref 6–20)
CHLORIDE: 106 mmol/L (ref 101–111)
CO2: 24 mmol/L (ref 22–32)
Calcium: 8.8 mg/dL — ABNORMAL LOW (ref 8.9–10.3)
Creatinine, Ser: 0.79 mg/dL (ref 0.44–1.00)
GFR calc non Af Amer: >60 mL/min (ref >60)
Glucose, Bld: 100 mg/dL — ABNORMAL HIGH (ref 65–99)
POTASSIUM: 3.4 mmol/L — AB (ref 3.5–5.1)
SODIUM: 137 mmol/L (ref 135–145)
Total Bilirubin: 0.4 mg/dL (ref 0.3–1.2)
Total Protein: 6.8 g/dL (ref 6.5–8.1)

## 2016-10-26 LAB — URINALYSIS, ROUTINE W REFLEX MICROSCOPIC
Bilirubin Urine: NEGATIVE
GLUCOSE, UA: NEGATIVE mg/dL
KETONES UR: NEGATIVE mg/dL
LEUKOCYTES UA: NEGATIVE
Nitrite: NEGATIVE
PROTEIN: NEGATIVE mg/dL
Specific Gravity, Urine: 1.01 (ref 1.005–1.030)
pH: 6 (ref 5.0–8.0)

## 2016-10-26 LAB — CBC
HCT: 35.2 % — ABNORMAL LOW (ref 36.0–46.0)
HEMOGLOBIN: 12.1 g/dL (ref 12.0–15.0)
MCH: 28.7 pg (ref 26.0–34.0)
MCHC: 34.4 g/dL (ref 30.0–36.0)
MCV: 83.4 fL (ref 78.0–100.0)
Platelets: 315 10*3/uL (ref 150–400)
RBC: 4.22 MIL/uL (ref 3.87–5.11)
RDW: 14 % (ref 11.5–15.5)
WBC: 12.8 10*3/uL — AB (ref 4.0–10.5)

## 2016-10-26 LAB — INFLUENZA PANEL BY PCR (TYPE A & B)
INFLAPCR: NEGATIVE
INFLBPCR: NEGATIVE

## 2016-10-26 LAB — D-DIMER, QUANTITATIVE: D-Dimer, Quant: 0.33 ug/mL-FEU (ref 0.00–0.50)

## 2016-10-26 LAB — I-STAT TROPONIN, ED: Troponin i, poc: 0 ng/mL (ref 0.00–0.08)

## 2016-10-26 LAB — MAGNESIUM: Magnesium: 1.5 mg/dL — ABNORMAL LOW (ref 1.7–2.4)

## 2016-10-26 LAB — LIPASE, BLOOD: LIPASE: 17 U/L (ref 11–51)

## 2016-10-26 LAB — I-STAT CG4 LACTIC ACID, ED: Lactic Acid, Venous: 1.02 mmol/L (ref 0.5–1.9)

## 2016-10-26 MED ORDER — SIMVASTATIN 10 MG PO TABS
20.0000 mg | ORAL_TABLET | Freq: Every day | ORAL | Status: DC
  Administered 2016-10-26 – 2016-10-28 (×3): 20 mg via ORAL
  Filled 2016-10-26 (×3): qty 2
  Filled 2016-10-26 (×2): qty 1

## 2016-10-26 MED ORDER — DEXTROSE 5 % IV SOLN
500.0000 mg | Freq: Once | INTRAVENOUS | Status: AC
  Administered 2016-10-26: 500 mg via INTRAVENOUS
  Filled 2016-10-26: qty 500

## 2016-10-26 MED ORDER — NICOTINE 14 MG/24HR TD PT24
14.0000 mg | MEDICATED_PATCH | Freq: Every day | TRANSDERMAL | Status: DC
  Administered 2016-10-26 – 2016-10-29 (×4): 14 mg via TRANSDERMAL
  Filled 2016-10-26 (×4): qty 1

## 2016-10-26 MED ORDER — ONDANSETRON HCL 4 MG PO TABS: 4.0000 mg | ORAL_TABLET | Freq: Four times a day (QID) | ORAL | Status: DC | PRN

## 2016-10-26 MED ORDER — PANTOPRAZOLE SODIUM 40 MG PO TBEC
40.0000 mg | DELAYED_RELEASE_TABLET | Freq: Every day | ORAL | Status: DC
  Administered 2016-10-26 – 2016-10-29 (×4): 40 mg via ORAL
  Filled 2016-10-26 (×4): qty 1

## 2016-10-26 MED ORDER — DEXTROSE 5 % IV SOLN
1.0000 g | INTRAVENOUS | Status: DC
  Administered 2016-10-27 – 2016-10-28 (×2): 1 g via INTRAVENOUS
  Filled 2016-10-26 (×2): qty 10

## 2016-10-26 MED ORDER — IPRATROPIUM-ALBUTEROL 0.5-2.5 (3) MG/3ML IN SOLN
3.0000 mL | Freq: Once | RESPIRATORY_TRACT | Status: AC
  Administered 2016-10-26: 3 mL via RESPIRATORY_TRACT
  Filled 2016-10-26: qty 3

## 2016-10-26 MED ORDER — ONDANSETRON HCL 4 MG/2ML IJ SOLN
4.0000 mg | Freq: Once | INTRAMUSCULAR | Status: AC
  Administered 2016-10-26: 4 mg via INTRAVENOUS
  Filled 2016-10-26: qty 2

## 2016-10-26 MED ORDER — MOMETASONE FURO-FORMOTEROL FUM 200-5 MCG/ACT IN AERO: 2.0000 | INHALATION_SPRAY | Freq: Two times a day (BID) | RESPIRATORY_TRACT | Status: DC

## 2016-10-26 MED ORDER — MECLIZINE HCL 25 MG PO TABS
25.0000 mg | ORAL_TABLET | Freq: Three times a day (TID) | ORAL | Status: DC | PRN
  Filled 2016-10-26: qty 1

## 2016-10-26 MED ORDER — METHYLPREDNISOLONE SODIUM SUCC 125 MG IJ SOLR
125.0000 mg | Freq: Once | INTRAMUSCULAR | Status: AC
  Administered 2016-10-26: 125 mg via INTRAVENOUS
  Filled 2016-10-26: qty 2

## 2016-10-26 MED ORDER — FLUTICASONE PROPIONATE 50 MCG/ACT NA SUSP
2.0000 | Freq: Every day | NASAL | Status: DC
  Administered 2016-10-26 – 2016-10-28 (×3): 2 via NASAL
  Filled 2016-10-26: qty 16

## 2016-10-26 MED ORDER — GUAIFENESIN 100 MG/5ML PO SOLN
5.0000 mL | ORAL | Status: DC | PRN
  Administered 2016-10-26 – 2016-10-27 (×2): 100 mg via ORAL
  Filled 2016-10-26 (×2): qty 50

## 2016-10-26 MED ORDER — OXYCODONE HCL 5 MG PO TABS
5.0000 mg | ORAL_TABLET | Freq: Once | ORAL | Status: AC
  Administered 2016-10-26: 5 mg via ORAL
  Filled 2016-10-26: qty 1

## 2016-10-26 MED ORDER — ARFORMOTEROL TARTRATE 15 MCG/2ML IN NEBU
15.0000 ug | INHALATION_SOLUTION | Freq: Two times a day (BID) | RESPIRATORY_TRACT | Status: DC
  Administered 2016-10-26 – 2016-10-29 (×6): 15 ug via RESPIRATORY_TRACT
  Filled 2016-10-26 (×7): qty 2

## 2016-10-26 MED ORDER — CYCLOBENZAPRINE HCL 10 MG PO TABS
5.0000 mg | ORAL_TABLET | Freq: Three times a day (TID) | ORAL | Status: DC | PRN
  Administered 2016-10-26 – 2016-10-29 (×4): 5 mg via ORAL
  Filled 2016-10-26 (×4): qty 1

## 2016-10-26 MED ORDER — KETOROLAC TROMETHAMINE 30 MG/ML IJ SOLN
30.0000 mg | Freq: Once | INTRAMUSCULAR | Status: AC
  Administered 2016-10-26: 30 mg via INTRAVENOUS
  Filled 2016-10-26: qty 1

## 2016-10-26 MED ORDER — SODIUM CHLORIDE 0.9 % IV BOLUS (SEPSIS)
1000.0000 mL | Freq: Once | INTRAVENOUS | Status: AC
  Administered 2016-10-26: 1000 mL via INTRAVENOUS

## 2016-10-26 MED ORDER — GABAPENTIN 100 MG PO CAPS
100.0000 mg | ORAL_CAPSULE | Freq: Every day | ORAL | Status: DC | PRN
  Administered 2016-10-26 – 2016-10-27 (×2): 100 mg via ORAL
  Filled 2016-10-26 (×2): qty 1

## 2016-10-26 MED ORDER — IPRATROPIUM-ALBUTEROL 0.5-2.5 (3) MG/3ML IN SOLN
3.0000 mL | RESPIRATORY_TRACT | Status: DC
  Administered 2016-10-26: 3 mL via RESPIRATORY_TRACT
  Filled 2016-10-26: qty 3

## 2016-10-26 MED ORDER — IPRATROPIUM-ALBUTEROL 0.5-2.5 (3) MG/3ML IN SOLN
3.0000 mL | Freq: Four times a day (QID) | RESPIRATORY_TRACT | Status: DC
  Administered 2016-10-27: 3 mL via RESPIRATORY_TRACT
  Filled 2016-10-26: qty 3

## 2016-10-26 MED ORDER — KETOROLAC TROMETHAMINE 15 MG/ML IJ SOLN
15.0000 mg | Freq: Three times a day (TID) | INTRAMUSCULAR | Status: AC | PRN
  Administered 2016-10-27 – 2016-10-28 (×3): 15 mg via INTRAVENOUS
  Filled 2016-10-26 (×3): qty 1

## 2016-10-26 MED ORDER — ONDANSETRON HCL 4 MG/2ML IJ SOLN
4.0000 mg | Freq: Four times a day (QID) | INTRAMUSCULAR | Status: DC | PRN
Start: 2016-10-26 — End: 2016-10-29
  Administered 2016-10-27 – 2016-10-28 (×2): 4 mg via INTRAVENOUS
  Filled 2016-10-26 (×2): qty 2

## 2016-10-26 MED ORDER — ACETAMINOPHEN 650 MG RE SUPP: 650.0000 mg | Freq: Four times a day (QID) | RECTAL | Status: DC | PRN

## 2016-10-26 MED ORDER — AZITHROMYCIN 250 MG PO TABS
500.0000 mg | ORAL_TABLET | Freq: Every day | ORAL | Status: AC
  Administered 2016-10-27 – 2016-10-28 (×2): 500 mg via ORAL
  Filled 2016-10-26 (×2): qty 2

## 2016-10-26 MED ORDER — BUDESONIDE 0.25 MG/2ML IN SUSP
0.2500 mg | Freq: Two times a day (BID) | RESPIRATORY_TRACT | Status: DC
  Administered 2016-10-26 – 2016-10-27 (×2): 0.25 mg via RESPIRATORY_TRACT
  Filled 2016-10-26 (×2): qty 2

## 2016-10-26 MED ORDER — ENOXAPARIN SODIUM 40 MG/0.4ML ~~LOC~~ SOLN
40.0000 mg | SUBCUTANEOUS | Status: DC
  Administered 2016-10-26 – 2016-10-28 (×3): 40 mg via SUBCUTANEOUS
  Filled 2016-10-26 (×3): qty 0.4

## 2016-10-26 MED ORDER — NITROGLYCERIN 0.4 MG SL SUBL: 0.4000 mg | SUBLINGUAL_TABLET | SUBLINGUAL | Status: DC | PRN

## 2016-10-26 MED ORDER — DEXTROSE 5 % IV SOLN
1.0000 g | Freq: Once | INTRAVENOUS | Status: AC
  Administered 2016-10-26: 1 g via INTRAVENOUS
  Filled 2016-10-26: qty 10

## 2016-10-26 MED ORDER — ACETAMINOPHEN 325 MG PO TABS
650.0000 mg | ORAL_TABLET | Freq: Four times a day (QID) | ORAL | Status: DC | PRN
  Filled 2016-10-26: qty 2

## 2016-10-26 MED ORDER — METHYLPREDNISOLONE SODIUM SUCC 125 MG IJ SOLR
60.0000 mg | Freq: Two times a day (BID) | INTRAMUSCULAR | Status: DC
  Administered 2016-10-26 – 2016-10-27 (×2): 60 mg via INTRAVENOUS
  Filled 2016-10-26 (×2): qty 2

## 2016-10-26 MED ORDER — MONTELUKAST SODIUM 10 MG PO TABS
10.0000 mg | ORAL_TABLET | Freq: Every day | ORAL | Status: DC
  Administered 2016-10-26 – 2016-10-29 (×4): 10 mg via ORAL
  Filled 2016-10-26 (×4): qty 1

## 2016-10-26 MED ORDER — IPRATROPIUM-ALBUTEROL 0.5-2.5 (3) MG/3ML IN SOLN: 3.0000 mL | RESPIRATORY_TRACT | Status: DC | PRN

## 2016-10-26 MED ORDER — SENNOSIDES-DOCUSATE SODIUM 8.6-50 MG PO TABS
1.0000 | ORAL_TABLET | Freq: Every evening | ORAL | Status: DC | PRN
Start: 2016-10-26 — End: 2016-10-29

## 2016-10-26 NOTE — ED Triage Notes (Signed)
Pt report cough, emesis, sneezing, and abd pain for the past month. Went to PCP for same and was diagnosed with sinus and chest infection. Pt reports she still feels unwell.

## 2016-10-26 NOTE — ED Provider Notes (Addendum)
[place] DEPT Provider Note   CSN: [id number] Arrival date & time: 10/26/16  1034     History   Chief Complaint Chief Complaint  Patient presents with  . Emesis  . Cough    HPI Lori [name] is a 61 y.o. female with a past medical history significant for COPD, asthma, CAD with multiple MI, anal hernia, and hyperlipidemia who presents with multiple complaints including rhinorrhea, congestion, chills, fevers, cough, fatigue, abdominal aching, nausea, vomiting, decrease in oral intake, diarrhea, and chest pain. Patient reports that she has had URI-like symptoms for the last month but have continued to worsen. She says her breathing has acutely worsened over the last 24 hours. She says that breathing treatment at home has not helped her symptoms. She says she is having wheezing and severe shortness of breath. She says that she has had worsening fatigue along with her symptoms. She said that she had diarrhea several days ago that has improved but is continuing to have nausea and vomiting today. She says she is not able to tolerate oral intake for the last 2 days. She reports subjective fevers but reports Constant chills. She says that her chest is having some sharp pain when she is coughing but it is not feeling like when she had heart attacks. She denies other symptoms upon arrival.   HPI  Past Medical History:  Diagnosis Date  . Acute MI    x3  . Allergy   . Anemia   . Ankle fracture 05/2016  . Anxiety   . Arthritis   . Asthma   . Barrett esophagus 2007  . Chest pain 05-02-2009   echo  EF 55%  . COPD (chronic obstructive pulmonary disease) with chronic bronchitis (HCC)    & Emphysema  . Depression   . Duodenitis without mention of hemorrhage 2007  . Esophageal reflux 2007  . Esophageal stricture   . Full dentures   . H/O cardiovascular stress test 05-02-2009   low risk scan, neg. for ischemia, no ekg changes  . H/O hiatal hernia   . Hiatal hernia [id number]  .  Hyperlipidemia   . Multiple fractures    from falls, fx rt. elbow, fx left wrist, bilateral ankles  . Ulcer [facility])     Patient Active Problem List   Diagnosis Date Noted  . Vertigo 10/22/2016  . Gastroesophageal reflux disease with esophagitis 10/22/2016  . Non compliance w medication regimen 08/01/2016  . Allergic contact dermatitis due to metals 08/01/2016  . Knee contusion 07/24/2016  . Leukocytosis 07/22/2016  . Migraine without aura and without status migrainosus, not intractable 07/22/2016  . Ankle fracture, left 06/21/2016  . Spasm of lumbar paraspinous muscle 06/21/2016  . Chronic rhinitis 06/19/2016  . Moderate persistent asthma with acute exacerbation 06/19/2016  . Compliance poor 06/19/2016  . Gait difficulty 06/21/2015  . Fibromyalgia 06/21/2015  . Conversion reaction 06/21/2015  . Depression 06/21/2015  . Syncope and collapse 06/21/2015  . Plica syndrome of left knee 01/06/2015  . Bronchitis 09/06/2014  . Anxiety 09/06/2014  . History of MI 09/06/2014  . Costochondritis 07/29/2013  . Chest pain with low risk of acute coronary syndrome 06/27/2013  . Leg fatigue 06/27/2013  . Smoking 06/27/2013  . Borderline hypertension 06/27/2013  . Low back pain 06/14/2013  . Leg pain, bilateral 06/14/2013  . GASTROPARESIS 10/26/2009  . Chest pain 05/02/2009  . Hyperlipidemia 11/18/2008  . Asthma 11/18/2008  . COPD 11/18/2008  . HEPATIC CYST 11/18/2008    Past Surgical  History:  Procedure Laterality Date  . ABDOMINAL HYSTERECTOMY    . CHOLECYSTECTOMY    . CHONDROPLASTY Left 01/06/2015   Procedure: CHONDROPLASTY;  Surgeon: Marybelle Killings, MD;  Location: [place];  Service: Orthopedics;  Laterality: Left;  . COLONOSCOPY    . ELBOW FRACTURE SURGERY     right  . KNEE ARTHROSCOPY WITH EXCISION PLICA Left 0000000   Procedure: KNEE ARTHROSCOPY WITH EXCISION PLICA;  Surgeon: Marybelle Killings, MD;  Location: [place];  Service: Orthopedics;   Laterality: Left;  . Lower Extremity Arterial Dopplers  07/06/2013   RABI 1.0, LABI 1.1.; No evidence of significant vascular atherogenic plaque  . NECK SURGERY    . NM MYOVIEW LTD  07/02/2013   Normal LV function, EF 83%. (Likely overestimated due to low volume);; no evidence of ischemia or infarction. Normal wall motion.  . TONSILLECTOMY    . WRIST FRACTURE SURGERY     left, has plate    OB History    No data available       Home Medications    Prior to Admission medications   Medication Sig Start Date End Date Taking? Authorizing Provider  acetaminophen (TYLENOL) 500 MG tablet Take 2 tablets (1,000 mg total) by mouth every 6 (six) hours as needed. 02/14/16   Charlesetta Shanks, MD  albuterol (PROVENTIL HFA;VENTOLIN HFA) 108 (90 Base) MCG/ACT inhaler Inhale 2 puffs into the lungs every 6 (six) hours as needed for wheezing or shortness of breath. 10/19/16   Ria Bush, MD  benzonatate (TESSALON) 100 MG capsule Take 1 capsule (100 mg total) by mouth 3 (three) times daily as needed for cough. 10/22/16   Flossie Buffy, NP  citalopram (CELEXA) 20 MG tablet Take 1 tablet (20 mg total) by mouth daily. --this rx replaces 07/19/16 rx 10/21/16   Flossie Buffy, NP  cyclobenzaprine (FLEXERIL) 5 MG tablet Take 1 tablet (5 mg total) by mouth 3 (three) times daily as needed for muscle spasms. 10/21/16   Flossie Buffy, NP  dexlansoprazole (DEXILANT) 60 MG capsule Take 1 capsule (60 mg total) by mouth daily. 10/21/16   Flossie Buffy, NP  EPINEPHrine 0.3 mg/0.3 mL IJ SOAJ injection Use as directed for severe allergic reaction 10/04/16   Valentina Shaggy, MD  fenofibrate (TRICOR) 145 MG tablet  07/31/16   Historical Provider, MD  fluticasone (FLONASE) 50 MCG/ACT nasal spray Place 2 sprays into both nostrils daily. 07/22/16   Flossie Buffy, NP  Fluticasone Furoate (ARNUITY ELLIPTA) 200 MCG/ACT AEPB Inhale 1 puff into the lungs daily. 10/21/16   Flossie Buffy, NP  gabapentin  (NEURONTIN) 100 MG capsule Take 1 capsule (100 mg total) by mouth as directed. 10/21/16   Flossie Buffy, NP  loperamide (IMODIUM) 2 MG capsule Take 1 capsule (2 mg total) by mouth as needed for diarrhea or loose stools. 10/21/16   Flossie Buffy, NP  meclizine (ANTIVERT) 25 MG tablet Take 1 tablet (25 mg total) by mouth 3 (three) times daily as needed for dizziness. Only as needed for DIZZINESS 10/21/16   Flossie Buffy, NP  montelukast (SINGULAIR) 10 MG tablet Take 1 tablet (10 mg total) by mouth daily. 10/21/16   Flossie Buffy, NP  nitroGLYCERIN (NITROSTAT) 0.4 MG SL tablet Place 1 tablet (0.4 mg total) under the tongue every 5 (five) minutes as needed for chest pain. 09/22/15   Leonie Man, MD  ondansetron (ZOFRAN) 4 MG tablet Take 1 tablet (4  mg total) by mouth every 8 (eight) hours as needed for nausea or vomiting. One tab 30 mintues before each bowel prep 10/21/16   Flossie Buffy, NP  simvastatin (ZOCOR) 20 MG tablet TAKE 1 TABLET BY MOUTH EVERY EVENING BEFORE BEDTIME FOR CHOLESTEROL 10/21/16   Flossie Buffy, NP    Family History Family History  Problem Relation Age of Onset  . Emphysema Father   . Dementia Mother   . Diabetes Maternal Grandmother   . Diabetes Maternal Aunt   . Diabetes      mat. cousin  . Heart disease Brother   . Colon cancer Neg Hx   . Allergic rhinitis Neg Hx   . Angioedema Neg Hx   . Asthma Neg Hx   . Atopy Neg Hx   . Eczema Neg Hx   . Immunodeficiency Neg Hx   . Urticaria Neg Hx     Social History Social History  Substance Use Topics  . Smoking status: Current Every Day Smoker    Packs/day: 1.50    Years: 21.00    Types: Cigarettes  . Smokeless tobacco: Never Used     Comment: tobaccco info given 03/06/12, 1.5 PPD 05/15/15  . Alcohol use No     Allergies   Bee venom; Codeine; Ibuprofen; Levofloxacin; Propoxyphene n-acetaminophen; Toradol [ketorolac tromethamine]; Hydrocodone-acetaminophen; and Latex   Review of Systems Review  of Systems  Constitutional: Negative for activity change, chills, diaphoresis, fatigue and fever.  HENT: Positive for congestion and rhinorrhea.   Eyes: Negative for visual disturbance.  Respiratory: Positive for cough and wheezing. Negative for chest tightness, shortness of breath and stridor.   Cardiovascular: Negative for chest pain, palpitations and leg swelling.  Gastrointestinal: Negative for abdominal distention, abdominal pain, constipation, diarrhea, nausea and vomiting.  Genitourinary: Negative for difficulty urinating, dysuria, flank pain, frequency, hematuria, menstrual problem, pelvic pain, vaginal bleeding and vaginal discharge.  Musculoskeletal: Negative for back pain and neck pain.  Skin: Negative for rash and wound.  Neurological: Negative for dizziness, weakness, light-headedness, numbness and headaches.  Psychiatric/Behavioral: Negative for agitation and confusion.  All other systems reviewed and are negative.    Physical Exam Updated Vital Signs BP 110/62   Pulse 82   Temp 98 F (36.7 C)   Resp 18   SpO2 95%   Physical Exam  Constitutional: She is oriented to person, place, and time. She appears well-developed and well-nourished. No distress.  HENT:  Head: Normocephalic and atraumatic.  Right Ear: External ear normal.  Left Ear: External ear normal.  Nose: Nose normal.  Mouth/Throat: Oropharynx is clear and moist. No oropharyngeal exudate.  Eyes: Conjunctivae and EOM are normal. Pupils are equal, round, and reactive to light.  Neck: Normal range of motion. Neck supple.  Cardiovascular: Normal rate and normal heart sounds.   No murmur heard. Pulmonary/Chest: No stridor. No respiratory distress. She has wheezes. She exhibits no tenderness.  Abdominal: She exhibits no distension. There is no tenderness. There is no rebound.  Musculoskeletal: She exhibits no tenderness.  Neurological: She is alert and oriented to person, place, and time. She has normal  reflexes. She exhibits normal muscle tone. Coordination normal.  Skin: Skin is warm. No rash noted. She is not diaphoretic. No erythema.  Psychiatric: She has a normal mood and affect. Her behavior is normal.  Nursing note and vitals reviewed.    ED Treatments / Results  Labs (all labs ordered are listed, but only abnormal results are displayed) Labs Reviewed  COMPREHENSIVE METABOLIC  PANEL - Abnormal; Notable for the following:       Result Value   Potassium 3.4 (*)    Glucose, Bld 100 (*)    Calcium 8.8 (*)    All other components within normal limits  CBC - Abnormal; Notable for the following:    WBC 12.8 (*)    HCT 35.2 (*)    All other components within normal limits  URINALYSIS, ROUTINE W REFLEX MICROSCOPIC - Abnormal; Notable for the following:    Hgb urine dipstick SMALL (*)    Bacteria, UA RARE (*)    Squamous Epithelial / LPF 0-5 (*)    All other components within normal limits  LIPASE, BLOOD  D-DIMER, QUANTITATIVE (NOT AT [facility])  I-STAT CG4 LACTIC ACID, ED  Randolm Idol, ED    EKG  EKG Interpretation None       Radiology Dg Chest 2 View  Result Date: 10/26/2016 CLINICAL DATA:  Chest pain.  Cold and chills. EXAM: CHEST  2 VIEW COMPARISON:  October 19, 2016 FINDINGS: The cardiomediastinal silhouette is stable. Prominent epicardial fat adjacent to the right side of the heart, unchanged since January 2017. Left greater than right pleuroparenchymal thickening in the apices, unchanged. No pneumothorax. Mild opacity in the lateral left lung base on only the frontal view. No overt edema. No other abnormalities. IMPRESSION: Possible mild opacity in the lateral left lung base. Recommend follow-up to resolution. No other acute abnormalities. Electronically Signed   By: Dorise Bullion III M.D   On: 10/26/2016 14:26    Procedures Procedures (including critical care time)  Medications Ordered in ED Medications  cefTRIAXone (ROCEPHIN) 1 g in dextrose 5 % 50 mL IVPB  (not administered)  azithromycin (ZITHROMAX) 500 mg in dextrose 5 % 250 mL IVPB (not administered)  ipratropium-albuterol (DUONEB) 0.5-2.5 (3) MG/3ML nebulizer solution 3 mL (3 mLs Nebulization Given 10/26/16 1340)  methylPREDNISolone sodium succinate (SOLU-MEDROL) 125 mg/2 mL injection 125 mg (125 mg Intravenous Given 10/26/16 1358)  ondansetron (ZOFRAN) injection 4 mg (4 mg Intravenous Given 10/26/16 1358)  sodium chloride 0.9 % bolus 1,000 mL (1,000 mLs Intravenous New Bag/Given 10/26/16 1357)     Initial Impression / Assessment and Plan / ED Course  I have reviewed the triage vital signs and the nursing notes.  Pertinent labs & imaging results that were available during my care of the patient were reviewed by me and considered in my medical decision making (see chart for details).  Clinical Course as of Oct 27 98  Sat Oct 26, 2016  1600 Leukocytes, UA: NEGATIVE [CT]    Clinical Course User Index [CT] Gwenyth Allegra Aydrien Froman, MD    Lori [name] is a 61 y.o. female with a past medical history significant for COPD, asthma, CAD with multiple MI, anal hernia, and hyperlipidemia who presents with multiple complaints including rhinorrhea, congestion, chills, fevers, cough, fatigue, abdominal aching, nausea, vomiting, decrease in oral intake, diarrhea, and chest pain.   history and exam are seen above.  On exam, patient had significant wheezing in all lung fields and coarse breath sounds. Patient had no chest tenderness. Patient's abdomen was nontender to palpation. Patient had no significant edema in her lower sternum adjacent no rashes were seen. Patient had no focal neurologic deficits. Patient had some audible congestion and visible rhinorrhea.  Based on exam and description of symptoms, there is clinical concern for pneumonia. Patient had chest x-ray showing concern for possible pneumonia. Patient given DuoNeb and steroids for her wheezing. Suspect her pneumonia  is exacerbating her  chronic lung diseases. Patient had improvement in her wheezing after the breathing treatments but is continued to have significant shortness of breath. Patient given Rocephin and azithromycin to treat community associated pneumonia.  Patient's other laboratory testing showed evidence of leukocytosis. No evidence of anemia. Urinalysis did not show evidence of infection and troponin was negative. Lactic acid nonelevated d-dimer was negative.  Given patient's report of continued shortness of breath and her overall symptoms, do not feel patient will be able to adequately treat his pneumonia as an outpatient.   After antibiotic given, hospitalist team was called for admission. Hospitalist team wants to see the patient prior to making decision on admission.   Final Clinical Impressions(s) / ED Diagnoses   Final diagnoses:  Cough  Nausea and vomiting, intractability of vomiting not specified, unspecified vomiting type  Community acquired pneumonia, unspecified laterality    New Prescriptions New Prescriptions   No medications on file   Clinical Impression: 1. Cough   2. Nausea and vomiting, intractability of vomiting not specified, unspecified vomiting type   3. Community acquired pneumonia, unspecified laterality     Disposition: Admit to hospitalist service    Courtney Paris, MD 10/27/16 [id number]    Gwenyth Allegra Daylani Deblois, MD 11/06/16 2006

## 2016-10-26 NOTE — H&P (Addendum)
History and Physical    Lori [name] [id number] DOB: 11/05/1955  DOA: 10/26/2016 PCP: Wilfred Lacy, NP  Patient coming from: Home  Chief Complaint: Shortness of breath  HPI: Lori [name] is a 61 y.o. female with medical history significant of COPD, asthma, and CAD presented to the emergency department complaining of shortness of breath, rhinorrhea, congestion, chills, cough, fatigue, abdominal aching and chest tightness. Symptoms started 2 days ago but shortness of breath have been progressively worsened to the point that she cannot walk without getting "heavy breathing". Patient have use her inhalers at home with no improvement. Chest pain is sharp in origin under day right breast, 6-8 out of 10, that is worse with deep inspiration. No other symptoms at this time. Patient is not oxygen dependent and is currently smoker  ED Course: Patient was found to be hypoxic, saturation to the 80s when speaking. Checks x-ray show left lower lobe opacity likely to be pneumonia. WBC elevated. Patient received laser and steroid treatment with no significant improvement.  Review of Systems:   Gen: Positive for malaise and fatigue and chills ENT: no blurry vision, hearing changes or sore throat Respirary: see history of present illness  GI: no nausea, vomiting, abdominal pain, diarrhea, constipation GU: no dysuria, burning on urination, increased urinary frequency, hematuria  Ext:. No deformities Neuro: no unilateral weakness, numbness, or tingling, no vision change or hearing loss Skin: No rashes, lesions or wounds. MSK: No muscle spasm, no deformity, no limitation of range of movement in spin Heme: No easy bruising.    Past Medical History:  Diagnosis Date  . Acute MI    x3  . Allergy   . Anemia   . Ankle fracture 05/2016  . Anxiety   . Arthritis   . Asthma   . Barrett esophagus 2007  . Chest pain 05-02-2009   echo  EF 55%  . COPD (chronic obstructive pulmonary disease) with  chronic bronchitis (HCC)    & Emphysema  . Depression   . Duodenitis without mention of hemorrhage 2007  . Esophageal reflux 2007  . Esophageal stricture   . Full dentures   . H/O cardiovascular stress test 05-02-2009   low risk scan, neg. for ischemia, no ekg changes  . H/O hiatal hernia   . Hiatal hernia [id number]  . Hyperlipidemia   . Multiple fractures    from falls, fx rt. elbow, fx left wrist, bilateral ankles  . Ulcer [facility])     Past Surgical History:  Procedure Laterality Date  . ABDOMINAL HYSTERECTOMY    . CHOLECYSTECTOMY    . CHONDROPLASTY Left 01/06/2015   Procedure: CHONDROPLASTY;  Surgeon: Marybelle Killings, MD;  Location: [place];  Service: Orthopedics;  Laterality: Left;  . COLONOSCOPY    . ELBOW FRACTURE SURGERY     right  . KNEE ARTHROSCOPY WITH EXCISION PLICA Left [id number]   Procedure: KNEE ARTHROSCOPY WITH EXCISION PLICA;  Surgeon: Marybelle Killings, MD;  Location: [place];  Service: Orthopedics;  Laterality: Left;  . Lower Extremity Arterial Dopplers  07/06/2013   RABI 1.0, LABI 1.1.; No evidence of significant vascular atherogenic plaque  . NECK SURGERY    . NM MYOVIEW LTD  07/02/2013   Normal LV function, EF 83%. (Likely overestimated due to low volume);; no evidence of ischemia or infarction. Normal wall motion.  . TONSILLECTOMY    . WRIST FRACTURE SURGERY     left, has plate     reports that she  has been smoking Cigarettes.  She has a 31.50 pack-year smoking history. She has never used smokeless tobacco. She reports that she does not drink alcohol or use drugs.  Allergies  Allergen Reactions  . Bee Venom Swelling  . Codeine Nausea Only    CAUSES ULCERS  . Ibuprofen Nausea And Vomiting  . Levofloxacin Nausea Only  . Propoxyphene N-Acetaminophen Nausea Only  . Toradol [Ketorolac Tromethamine] Nausea And Vomiting  . Hydrocodone-Acetaminophen Nausea Only    REACTION: "sick"  . Latex Rash    Family History  Problem  Relation Age of Onset  . Emphysema Father   . Dementia Mother   . Diabetes Maternal Grandmother   . Diabetes Maternal Aunt   . Diabetes      mat. cousin  . Heart disease Brother   . Colon cancer Neg Hx   . Allergic rhinitis Neg Hx   . Angioedema Neg Hx   . Asthma Neg Hx   . Atopy Neg Hx   . Eczema Neg Hx   . Immunodeficiency Neg Hx   . Urticaria Neg Hx    Family history reviewed and noncontributory  Prior to Admission medications   Medication Sig Start Date End Date Taking? Authorizing Provider  albuterol (PROVENTIL HFA;VENTOLIN HFA) 108 (90 Base) MCG/ACT inhaler Inhale 2 puffs into the lungs every 6 (six) hours as needed for wheezing or shortness of breath. 10/19/16  Yes Ria Bush, MD  cyclobenzaprine (FLEXERIL) 5 MG tablet Take 1 tablet (5 mg total) by mouth 3 (three) times daily as needed for muscle spasms. 10/21/16  Yes Charlene Brooke Nche, NP  dexlansoprazole (DEXILANT) 60 MG capsule Take 1 capsule (60 mg total) by mouth daily. 10/21/16  Yes Charlene Brooke Nche, NP  DULERA 200-5 MCG/ACT AERO Inhale 1 puff into the lungs 2 (two) times daily. 10/21/16  Yes Historical Provider, MD  fluticasone (FLONASE) 50 MCG/ACT nasal spray Place 2 sprays into both nostrils daily. 07/22/16  Yes Flossie Buffy, NP  meclizine (ANTIVERT) 25 MG tablet Take 1 tablet (25 mg total) by mouth 3 (three) times daily as needed for dizziness. Only as needed for DIZZINESS 10/21/16  Yes Charlene Brooke Nche, NP  montelukast (SINGULAIR) 10 MG tablet Take 1 tablet (10 mg total) by mouth daily. 10/21/16  Yes Charlene Brooke Nche, NP  valACYclovir (VALTREX) 500 MG tablet Take 500 mg by mouth daily. 07/23/16  Yes Historical Provider, MD  acetaminophen (TYLENOL) 500 MG tablet Take 2 tablets (1,000 mg total) by mouth every 6 (six) hours as needed. Patient not taking: Reported on 10/26/2016 02/14/16   Charlesetta Shanks, MD  benzonatate (TESSALON) 100 MG capsule Take 1 capsule (100 mg total) by mouth 3 (three) times daily as needed for  cough. Patient not taking: Reported on 10/26/2016 10/22/16   Flossie Buffy, NP  citalopram (CELEXA) 20 MG tablet Take 1 tablet (20 mg total) by mouth daily. --this rx replaces 07/19/16 rx Patient not taking: Reported on 10/26/2016 10/21/16   Flossie Buffy, NP  EPINEPHrine 0.3 mg/0.3 mL IJ SOAJ injection Use as directed for severe allergic reaction 10/04/16   Valentina Shaggy, MD  Fluticasone Furoate (ARNUITY ELLIPTA) 200 MCG/ACT AEPB Inhale 1 puff into the lungs daily. 10/21/16   Flossie Buffy, NP  gabapentin (NEURONTIN) 100 MG capsule Take 1 capsule (100 mg total) by mouth as directed. 10/21/16   Flossie Buffy, NP  loperamide (IMODIUM) 2 MG capsule Take 1 capsule (2 mg total) by mouth as needed for diarrhea or  loose stools. Patient not taking: Reported on 10/26/2016 10/21/16   Flossie Buffy, NP  nitroGLYCERIN (NITROSTAT) 0.4 MG SL tablet Place 1 tablet (0.4 mg total) under the tongue every 5 (five) minutes as needed for chest pain. 09/22/15   Leonie Man, MD  ondansetron (ZOFRAN) 4 MG tablet Take 1 tablet (4 mg total) by mouth every 8 (eight) hours as needed for nausea or vomiting. One tab 30 mintues before each bowel prep Patient not taking: Reported on 10/26/2016 10/21/16   Flossie Buffy, NP  simvastatin (ZOCOR) 20 MG tablet TAKE 1 TABLET BY MOUTH EVERY EVENING BEFORE BEDTIME FOR CHOLESTEROL 10/21/16   Flossie Buffy, NP    Physical Exam: Vitals:   10/26/16 1316 10/26/16 1340 10/26/16 1342 10/26/16 1537  BP: 124/73   140/64  Pulse: 69   77  Resp: 15   20  Temp:      SpO2: 100% 97% 97% 99%     Constitutional: Mild distress, difficulty speaking in full sentences Eyes: PERRL, EOMI  ENMT: Mucous membranes are moist. Posterior pharynx clear  Neck: normal, supple, no masses, no thyromegaly Respiratory: Decreased breath sounds in the left lower lung, upper lobe with coarse breath sounds. Diffuse expiratory wheezing. Chest wall tenderness under the right breast.    Cardiovascular: Regular rate and rhythm, no murmurs / rubs / gallops. No extremity edema. 2+ pedal pulses.  Abdomen: no tenderness, no masses palpated. No hepatosplenomegaly. Bowel sounds positive.  Musculoskeletal: no clubbing / cyanosis. No joint deformity upper and lower extremities. Good ROM  Skin: no rashes, lesions, ulcers. No induration Neurologic: CN 2-12 grossly intact. Sensation intact. Strength 5/5 in all 4.  Psychiatric: Normal judgment and insight. Alert and oriented x 3. Normal mood.    Labs on Admission: I have personally reviewed following labs and imaging studies  CBC:  Recent Labs Lab 10/21/16 1012 10/26/16 1154  WBC 7.7 12.8*  NEUTROABS 3.4  --   HGB 13.1 12.1  HCT 38.9 35.2*  MCV 84.5 83.4  PLT 309.0 123456   Basic Metabolic Panel:  Recent Labs Lab 10/26/16 1154  NA 137  K 3.4*  CL 106  CO2 24  GLUCOSE 100*  BUN 13  CREATININE 0.79  CALCIUM 8.8*   GFR: Estimated Creatinine Clearance: 64.2 mL/min (by C-G formula based on SCr of 0.79 mg/dL). Liver Function Tests:  Recent Labs Lab 10/26/16 1154  AST 15  ALT 29  ALKPHOS 114  BILITOT 0.4  PROT 6.8  ALBUMIN 3.9    Recent Labs Lab 10/26/16 1154  LIPASE 17   No results for input(s): AMMONIA in the last 168 hours. Coagulation Profile: No results for input(s): INR, PROTIME in the last 168 hours. Cardiac Enzymes: No results for input(s): CKTOTAL, CKMB, CKMBINDEX, TROPONINI in the last 168 hours. BNP (last 3 results) No results for input(s): PROBNP in the last 8760 hours. HbA1C: No results for input(s): HGBA1C in the last 72 hours. CBG: No results for input(s): GLUCAP in the last 168 hours. Lipid Profile: No results for input(s): CHOL, HDL, LDLCALC, TRIG, CHOLHDL, LDLDIRECT in the last 72 hours. Thyroid Function Tests: No results for input(s): TSH, T4TOTAL, FREET4, T3FREE, THYROIDAB in the last 72 hours. Anemia Panel: No results for input(s): VITAMINB12, FOLATE, FERRITIN, TIBC, IRON,  RETICCTPCT in the last 72 hours. Urine analysis:    Component Value Date/Time   COLORURINE YELLOW 10/26/2016 [place] 10/26/2016 1315   LABSPEC 1.010 10/26/2016 1315   PHURINE 6.0 10/26/2016 1315  GLUCOSEU NEGATIVE 10/26/2016 [place] 12/08/2007 1533   HGBUR SMALL (A) 10/26/2016 1315   [facility] [place] 10/26/2016 1315   BILIRUBINUR negative 06/21/2016 1025   KETONESUR NEGATIVE 10/26/2016 1315   PROTEINUR NEGATIVE 10/26/2016 1315   UROBILINOGEN 0.2 06/21/2016 1025   UROBILINOGEN 0.2 06/27/2015 1018   NITRITE NEGATIVE 10/26/2016 1315   LEUKOCYTESUR NEGATIVE 10/26/2016 1315    Radiological Exams on Admission: Dg Chest 2 View  Result Date: 10/26/2016 CLINICAL DATA:  Chest pain.  Cold and chills. EXAM: CHEST  2 VIEW COMPARISON:  October 19, 2016 FINDINGS: The cardiomediastinal silhouette is stable. Prominent epicardial fat adjacent to the right side of the heart, unchanged since January 2017. Left greater than right pleuroparenchymal thickening in the apices, unchanged. No pneumothorax. Mild opacity in the lateral left lung base on only the frontal view. No overt edema. No other abnormalities. IMPRESSION: Possible mild opacity in the lateral left lung base. Recommend follow-up to resolution. No other acute abnormalities. Electronically Signed   By: Dorise Bullion III M.D   On: 10/26/2016 14:26    EKG: Independently reviewed.   Assessment/Plan Left lower lobe pneumonia leading to COPD exacerbation. Admit to MedSurg IV antibiotic - Rocephin and azithromycin Solu-Medrol 60 mg twice a day Duonebs every 4 hours and every 2 hours when necessary Pulmicort and Brovana twice a day Oxygen as needed keep saturations above 89% Check pulse ox with ambulation Sent influenza PCR Strep Pneumo antigen  Follow up blood cultures  COPD/Asthma exacerbation  see above  CAD - stable, chest pain at the apical no significant signs for ACS at this time EKG with no  changes troponins normal. Nitroglycerin when necessary Staffing and ASA.  Tobacco abuse Tobacco cessation discussed  DVT prophylaxis: Lovenox Code Status: Full Family Communication: None at bedside Disposition Plan: Anticipate discharge to previous home environment.  Consults called: None Admission status: MedSurg/inpatient   Chipper Oman MD [facility] Pager: Text Page via [email or web address]  [phone]  If 7PM-7AM, please contact night-coverage [email or web address] Password [email or web address]  10/26/2016, 4:44 PM

## 2016-10-26 NOTE — ED Notes (Signed)
MD at bedside. 

## 2016-10-26 NOTE — Progress Notes (Signed)
Pharmacy Antibiotic Note  Lori [name] is a 61 y.o. female admitted on 10/26/2016 with pneumonia.  Pharmacy has been consulted for ceftriaxone dosing.  Plan:  Ceftriaxone 1g IV q24h, first dose already given in ED on 2/10.  Dosage remains stable and need for further dosage adjustment appears unlikely at present.  Pharmacy will sign off at this time.  Please reconsult if a change in clinical status warrants re-evaluation of dosage.      Temp (24hrs), Avg:98 F (36.7 C), Min:98 F (36.7 C), Max:98 F (36.7 C)   Recent Labs Lab 10/21/16 1012 10/26/16 1154 10/26/16 1407  WBC 7.7 12.8*  --   CREATININE  --  0.79  --   LATICACIDVEN  --   --  1.02    Estimated Creatinine Clearance: 64.2 mL/min (by C-G formula based on SCr of 0.79 mg/dL).    Allergies  Allergen Reactions  . Bee Venom Swelling  . Codeine Nausea Only    CAUSES ULCERS  . Ibuprofen Nausea And Vomiting  . Levofloxacin Nausea Only  . Propoxyphene N-Acetaminophen Nausea Only  . Toradol [Ketorolac Tromethamine] Nausea And Vomiting  . Hydrocodone-Acetaminophen Nausea Only    REACTION: "sick"  . Latex Rash    Antimicrobials this admission: 2/10 Azithromycin >>  2/10 Ceftriaxone >>   Dose adjustments this admission:  Microbiology results: 2/10 BCx:  2/10 UA not c/w infection 2/10 Strep pneumo UAg:  2/10 Influenza A/B:   Thank you for allowing pharmacy to be a part of this patient's care.  Gretta Arab PharmD, BCPS Pager [phone] 10/26/2016 4:40 PM

## 2016-10-27 DIAGNOSIS — E785 Hyperlipidemia, unspecified: Secondary | ICD-10-CM

## 2016-10-27 DIAGNOSIS — F411 Generalized anxiety disorder: Secondary | ICD-10-CM

## 2016-10-27 DIAGNOSIS — Z720 Tobacco use: Secondary | ICD-10-CM

## 2016-10-27 DIAGNOSIS — J441 Chronic obstructive pulmonary disease with (acute) exacerbation: Secondary | ICD-10-CM

## 2016-10-27 DIAGNOSIS — J181 Lobar pneumonia, unspecified organism: Secondary | ICD-10-CM

## 2016-10-27 LAB — BASIC METABOLIC PANEL
Anion gap: 11 (ref 5–15)
BUN: 13 mg/dL (ref 6–20)
CO2: 18 mmol/L — ABNORMAL LOW (ref 22–32)
CREATININE: 0.85 mg/dL (ref 0.44–1.00)
Calcium: 8.8 mg/dL — ABNORMAL LOW (ref 8.9–10.3)
Chloride: 107 mmol/L (ref 101–111)
GFR calc Af Amer: >60 mL/min (ref >60)
Glucose, Bld: 205 mg/dL — ABNORMAL HIGH (ref 65–99)
POTASSIUM: 4 mmol/L (ref 3.5–5.1)
SODIUM: 136 mmol/L (ref 135–145)

## 2016-10-27 LAB — CBC
HEMATOCRIT: 32.4 % — AB (ref 36.0–46.0)
Hemoglobin: 10.9 g/dL — ABNORMAL LOW (ref 12.0–15.0)
MCH: 27.9 pg (ref 26.0–34.0)
MCHC: 33.6 g/dL (ref 30.0–36.0)
MCV: 83.1 fL (ref 78.0–100.0)
PLATELETS: 268 10*3/uL (ref 150–400)
RBC: 3.9 MIL/uL (ref 3.87–5.11)
RDW: 13.9 % (ref 11.5–15.5)
WBC: 10.9 10*3/uL — AB (ref 4.0–10.5)

## 2016-10-27 LAB — STREP PNEUMONIAE URINARY ANTIGEN: STREP PNEUMO URINARY ANTIGEN: NEGATIVE

## 2016-10-27 MED ORDER — TRAMADOL HCL 50 MG PO TABS
50.0000 mg | ORAL_TABLET | Freq: Three times a day (TID) | ORAL | Status: DC | PRN
  Administered 2016-10-27 – 2016-10-28 (×3): 50 mg via ORAL
  Filled 2016-10-27 (×4): qty 1

## 2016-10-27 MED ORDER — IPRATROPIUM-ALBUTEROL 0.5-2.5 (3) MG/3ML IN SOLN
3.0000 mL | RESPIRATORY_TRACT | Status: DC | PRN
  Administered 2016-10-28: 3 mL via RESPIRATORY_TRACT
  Filled 2016-10-27: qty 3

## 2016-10-27 MED ORDER — METHYLPREDNISOLONE SODIUM SUCC 125 MG IJ SOLR
60.0000 mg | Freq: Three times a day (TID) | INTRAMUSCULAR | Status: DC
  Administered 2016-10-27 – 2016-10-29 (×5): 60 mg via INTRAVENOUS
  Filled 2016-10-27 (×5): qty 2

## 2016-10-27 MED ORDER — ALUM & MAG HYDROXIDE-SIMETH 200-200-20 MG/5ML PO SUSP
15.0000 mL | Freq: Once | ORAL | Status: AC
  Administered 2016-10-27: 15 mL via ORAL
  Filled 2016-10-27: qty 30

## 2016-10-27 MED ORDER — BUDESONIDE 0.5 MG/2ML IN SUSP
0.5000 mg | Freq: Two times a day (BID) | RESPIRATORY_TRACT | Status: DC
  Administered 2016-10-27 – 2016-10-29 (×4): 0.5 mg via RESPIRATORY_TRACT
  Filled 2016-10-27 (×4): qty 2

## 2016-10-27 MED ORDER — LORAZEPAM 0.5 MG PO TABS
0.5000 mg | ORAL_TABLET | Freq: Three times a day (TID) | ORAL | Status: DC | PRN
  Administered 2016-10-27 – 2016-10-28 (×2): 0.5 mg via ORAL
  Filled 2016-10-27 (×2): qty 1

## 2016-10-27 MED ORDER — IPRATROPIUM-ALBUTEROL 0.5-2.5 (3) MG/3ML IN SOLN
3.0000 mL | Freq: Three times a day (TID) | RESPIRATORY_TRACT | Status: DC
  Administered 2016-10-27 – 2016-10-29 (×6): 3 mL via RESPIRATORY_TRACT
  Filled 2016-10-27 (×6): qty 3

## 2016-10-27 NOTE — Progress Notes (Addendum)
[facility] PROGRESS NOTE  Lori [name] [id number] DOB: 08-Apr-1956 DOA: 10/26/2016 PCP: Wilfred Lacy, NP  Interim summary and HPI 61 y.o. female with medical history significant of COPD, asthma, and CAD presented to the emergency department complaining of shortness of breath, rhinorrhea, congestion, chills, cough, fatigue, abdominal aching and chest tightness. Symptoms started 2 days ago but shortness of breath have been progressively worsened to the point that she cannot walk without getting "heavy breathing". Patient have use her inhalers at home with no improvement. Chest pain is sharp in origin under her right breast, 6-8 out of 10, that is worse with deep inspiration. No other symptoms at this time. Patient is not oxygen dependent and is currently a smoker.  Assessment/Plan: Left lower lobe pneumonia leading to COPD exacerbation. -continue Solu-Medrol 60 mg Q8H  -continue steroids and antibiotics -PRN duoneb -continue Pulmicort and Brovana twice a day -continue Oxygen as needed keep saturations above 89% -Check pulse ox with ambulation -influenza negative, follow results for Strep Pneumo and legionella antigen  -Follow up blood cultures  COPD/Asthma exacerbation  -slowly improving  -patient will benefit of assessment from pulmonary service at discharge, for PFT's and adjustment on maintenance therapy.  CAD - stable, chest pain atypical; no significant signs for ACS at this time EKG with no changes. -troponins normal. -Nitroglycerin when necessary -continue statins and ASA.  Tobacco abuse -Tobacco cessation discussed -nicoderm patch ordered   Anxiety -will use PRN ativan  HLD -will continue statins  Code Status: Full Family Communication: no family at bedside  Disposition Plan: remains inpatient, still SOB, tachypneic, with fair air movement and difficulty speaking in full sentences. Patient requiring oxygen supplementation.    Consultants:  None    Procedures:  See below for x-ray reports   Antibiotics:  Rocephin and zythromax 12/10  HPI/Subjective: Afebrile, no nausea, no vomiting. Patient with difficulty breathing, some difficulties speaking in full sentences, tachypneic and requiring oxygen supplementation.   Objective: Vitals:   10/26/16 2023 10/27/16 0419  BP: 116/72 (!) 83/63  Pulse: 72 83  Resp: 18 16  Temp: 98.2 F (36.8 C) 97.7 F (36.5 C)    Intake/Output Summary (Last 24 hours) at 10/27/16 1031 Last data filed at 10/26/16 2147  Gross per 24 hour  Intake             1410 ml  Output              350 ml  Net             1060 ml   Filed Weights   10/26/16 1709  Weight: 62.1 kg (136 lb 12.8 oz)    Exam:   General: afebrile. Patient reports not feeling good today, still SOB, tachypneic on exam and requiring oxygen supplementation. She endorses pain across lower part of her chest and very weaned with minimal exertion.  Cardiovascular: S1 and s2, no rubs or gallops  Respiratory: fair air movement, positive rhonchi, positive exp wheezing, tachypneic.  Abdomen: soft, ND, positive BS, no guarding   Musculoskeletal: no edema, no cyanosis   Data Reviewed: Basic Metabolic Panel:  Recent Labs Lab 10/26/16 1154 10/26/16 1812 10/27/16 0356  NA 137  --  136  K 3.4*  --  4.0  CL 106  --  107  CO2 24  --  18*  GLUCOSE 100*  --  205*  BUN 13  --  13  CREATININE 0.79  --  0.85  CALCIUM 8.8*  --  8.8*  MG  --  1.5*  --    Liver Function Tests:  Recent Labs Lab 10/26/16 1154  AST 15  ALT 29  ALKPHOS 114  BILITOT 0.4  PROT 6.8  ALBUMIN 3.9    Recent Labs Lab 10/26/16 1154  LIPASE 17   CBC:  Recent Labs Lab 10/21/16 1012 10/26/16 1154 10/27/16 0356  WBC 7.7 12.8* 10.9*  NEUTROABS 3.4  --   --   HGB 13.1 12.1 10.9*  HCT 38.9 35.2* 32.4*  MCV 84.5 83.4 83.1  PLT 309.0 315 268   CBG: No results for input(s): GLUCAP in the last 168 hours.  Recent Results (from the past 240  hour(s))  Culture, blood (routine x 2)     Status: None (Preliminary result)   Collection Time: 10/26/16  6:12 PM  Result Value Ref Range Status   Specimen Description BLOOD LEFT ANTECUBITAL  Final   Special Requests BOTTLES DRAWN AEROBIC ONLY 5CC  Final   Culture PENDING  Incomplete   Report Status PENDING  Incomplete  Culture, blood (routine x 2)     Status: None (Preliminary result)   Collection Time: 10/26/16  6:12 PM  Result Value Ref Range Status   Specimen Description BLOOD LEFT FOREARM  Final   Special Requests IN PEDIATRIC BOTTLE 2CC  Final   Culture PENDING  Incomplete   Report Status PENDING  Incomplete     Studies: Dg Chest 2 View  Result Date: 10/26/2016 CLINICAL DATA:  Chest pain.  Cold and chills. EXAM: CHEST  2 VIEW COMPARISON:  October 19, 2016 FINDINGS: The cardiomediastinal silhouette is stable. Prominent epicardial fat adjacent to the right side of the heart, unchanged since January 2017. Left greater than right pleuroparenchymal thickening in the apices, unchanged. No pneumothorax. Mild opacity in the lateral left lung base on only the frontal view. No overt edema. No other abnormalities. IMPRESSION: Possible mild opacity in the lateral left lung base. Recommend follow-up to resolution. No other acute abnormalities. Electronically Signed   By: Dorise Bullion III M.D   On: 10/26/2016 14:26    Scheduled Meds: . arformoterol  15 mcg Nebulization BID  . azithromycin  500 mg Oral Daily  . budesonide (PULMICORT) nebulizer solution  0.5 mg Nebulization BID  . cefTRIAXone (ROCEPHIN)  IV  1 g Intravenous Q24H  . enoxaparin (LOVENOX) injection  40 mg Subcutaneous Q24H  . fluticasone  2 spray Each Nare Daily  . ipratropium-albuterol  3 mL Nebulization TID  . methylPREDNISolone (SOLU-MEDROL) injection  60 mg Intravenous Q8H  . montelukast  10 mg Oral Daily  . nicotine  14 mg Transdermal Daily  . pantoprazole  40 mg Oral Daily  . simvastatin  20 mg Oral q1800    Continuous Infusions:  Active Problems:   Pneumonia    Time spent: 25 minutes    Barton Dubois  [facility] Pager [phone]. If 7PM-7AM, please contact night-coverage at [email or web address], password [facility] 10/27/2016, 10:31 AM  LOS: 1 day

## 2016-10-28 ENCOUNTER — Encounter: Payer: Self-pay | Admitting: Nurse Practitioner

## 2016-10-28 DIAGNOSIS — R5381 Other malaise: Secondary | ICD-10-CM

## 2016-10-28 DIAGNOSIS — M792 Neuralgia and neuritis, unspecified: Secondary | ICD-10-CM

## 2016-10-28 DIAGNOSIS — K219 Gastro-esophageal reflux disease without esophagitis: Secondary | ICD-10-CM

## 2016-10-28 MED ORDER — BENZONATATE 100 MG PO CAPS: 200.0000 mg | ORAL_CAPSULE | Freq: Three times a day (TID) | ORAL | Status: DC | PRN

## 2016-10-28 MED ORDER — GABAPENTIN 100 MG PO CAPS
100.0000 mg | ORAL_CAPSULE | Freq: Three times a day (TID) | ORAL | Status: DC
  Administered 2016-10-28 – 2016-10-29 (×3): 100 mg via ORAL
  Filled 2016-10-28 (×3): qty 1

## 2016-10-28 MED ORDER — GUAIFENESIN ER 600 MG PO TB12
600.0000 mg | ORAL_TABLET | Freq: Two times a day (BID) | ORAL | Status: DC
  Administered 2016-10-28 – 2016-10-29 (×3): 600 mg via ORAL
  Filled 2016-10-28 (×3): qty 1

## 2016-10-28 MED ORDER — MAGNESIUM SULFATE 2 GM/50ML IV SOLN
2.0000 g | Freq: Once | INTRAVENOUS | Status: AC
  Administered 2016-10-28: 2 g via INTRAVENOUS
  Filled 2016-10-28: qty 50

## 2016-10-28 NOTE — Care Management Note (Signed)
Case Management Note  Patient Details  Name: JANESHIA GALAS MRN: [id number] Date of Birth: [birth year]  Subjective/Objective:      61 yo admitted with PNA              Action/Plan: From home with spouse. PT recommending HHPT and choice was offered for [facility] services. [facility] was chosen. Pt requesting 4 wheeled rolling walker with seat. [facility] rep contacted for referral.  Expected Discharge Date:  10/28/16               Expected Discharge Plan:  [place]  In-House Referral:     Discharge planning Services  CM Consult  Post Acute Care Choice:  Home Health Choice offered to:  Patient  DME Arranged:  Walker rolling with seat DME Agency:  [place] Arranged:  RN, PT [facility] Agency:  [place]  Status of Service:  In process, will continue to follow  If discussed at Long Length of Stay Meetings, dates discussed:    Additional CommentsLynnell Catalan, RN 10/28/2016, 1:57 PM  [phone]

## 2016-10-28 NOTE — Progress Notes (Signed)
[facility] PROGRESS NOTE  Lori [name] [id number] DOB: [birth year] DOA: 10/26/2016 PCP: Wilfred Lacy, NP  Interim summary and HPI 61 y.o. female with medical history significant of COPD, asthma, and CAD presented to the emergency department complaining of shortness of breath, rhinorrhea, congestion, chills, cough, fatigue, abdominal aching and chest tightness. Symptoms started 2 days ago but shortness of breath have been progressively worsened to the point that she cannot walk without getting "heavy breathing". Patient have use her inhalers at home with no improvement. Chest pain is sharp in origin under her right breast, 6-8 out of 10, that is worse with deep inspiration. No other symptoms at this time. Patient is not oxygen dependent and is currently a smoker.  Assessment/Plan: Left lower lobe pneumonia leading to COPD exacerbation. -continue Solu-Medrol 60 mg Q8H  -continue steroids and antibiotics -continue PRN duoneb -continue Pulmicort and Brovana twice a day -continue Oxygen as needed keep saturations above 89%; good O2 sat on RA currently -influenza negative, follow results for Strep Pneumo and legionella antigen  -Follow up blood cultures  COPD/Asthma exacerbation  -slowly improving  -patient will benefit of assessment from pulmonary service at discharge, for PFT's and adjustment on maintenance therapy.  CAD - stable, chest pain atypical; no significant signs for ACS at this time EKG with no changes. -troponins normal. -Nitroglycerin when necessary -continue statins and ASA.  Tobacco abuse -Tobacco cessation discussed -nicoderm patch ordered   Anxiety -will use PRN ativan  HLD -will continue statins  Physical deconditioning  -will follow PT assessment and recommendations   neuropathic pain -will continue neurontin TID  Code Status: Full Family Communication: no family at bedside  Disposition Plan: remains inpatient, still SOB, tachypneic, with  fair air movement and difficulty speaking in full sentences. Patient is slowly improving and at least is not requiring oxygen supplementation at this time.    Consultants:  None   Procedures:  See below for x-ray reports   Antibiotics:  Rocephin and zythromax 12/10  HPI/Subjective: Afebrile, no nausea, no vomiting. Patient continue to express difficulty breathing with minimal exertion, unable to speaking in full sentences, tachypneic and just weaned. Not requiring O2 supplementation currently.   Objective: Vitals:   10/27/16 2107 10/28/16 0506  BP: 98/69 (!) 95/56  Pulse: (!) 102 86  Resp: 18 18  Temp: 98.8 F (37.1 C) 98.6 F (37 C)    Intake/Output Summary (Last 24 hours) at 10/28/16 1013 Last data filed at 10/28/16 [id number]  Gross per 24 hour  Intake             1250 ml  Output             1250 ml  Net                0 ml   Filed Weights   10/26/16 1709  Weight: 62.1 kg (136 lb 12.8 oz)    Exam:   General: afebrile. Patient reports not feeling good and experiencing a rough night, still SOB, tachypneic on exam and with difficulty speaking in full sentences. Weak, deconditioned and tired.  Cardiovascular: S1 and s2, no rubs or gallops  Respiratory: fair air movement, positive diffuse rhonchi, positive exp wheezing, tachypneic with minimal exertion.  Abdomen: soft, ND, positive BS, no guarding   Musculoskeletal: no edema, no cyanosis   Data Reviewed: Basic Metabolic Panel:  Recent Labs Lab 10/26/16 1154 10/26/16 1812 10/27/16 0356  NA 137  --  136  K 3.4*  --  4.0  CL 106  --  107  CO2 24  --  18*  GLUCOSE 100*  --  205*  BUN 13  --  13  CREATININE 0.79  --  0.85  CALCIUM 8.8*  --  8.8*  MG  --  1.5*  --    Liver Function Tests:  Recent Labs Lab 10/26/16 1154  AST 15  ALT 29  ALKPHOS 114  BILITOT 0.4  PROT 6.8  ALBUMIN 3.9    Recent Labs Lab 10/26/16 1154  LIPASE 17   CBC:  Recent Labs Lab 10/26/16 1154 10/27/16 0356  WBC  12.8* 10.9*  HGB 12.1 10.9*  HCT 35.2* 32.4*  MCV 83.4 83.1  PLT 315 268   CBG: No results for input(s): GLUCAP in the last 168 hours.  Recent Results (from the past 240 hour(s))  Culture, blood (routine x 2)     Status: None (Preliminary result)   Collection Time: 10/26/16  6:12 PM  Result Value Ref Range Status   Specimen Description BLOOD LEFT ANTECUBITAL  Final   Special Requests BOTTLES DRAWN AEROBIC ONLY 5CC  Final   Culture PENDING  Incomplete   Report Status PENDING  Incomplete  Culture, blood (routine x 2)     Status: None (Preliminary result)   Collection Time: 10/26/16  6:12 PM  Result Value Ref Range Status   Specimen Description BLOOD LEFT FOREARM  Final   Special Requests IN PEDIATRIC BOTTLE 2CC  Final   Culture PENDING  Incomplete   Report Status PENDING  Incomplete     Studies: Dg Chest 2 View  Result Date: 10/26/2016 CLINICAL DATA:  Chest pain.  Cold and chills. EXAM: CHEST  2 VIEW COMPARISON:  October 19, 2016 FINDINGS: The cardiomediastinal silhouette is stable. Prominent epicardial fat adjacent to the right side of the heart, unchanged since January 2017. Left greater than right pleuroparenchymal thickening in the apices, unchanged. No pneumothorax. Mild opacity in the lateral left lung base on only the frontal view. No overt edema. No other abnormalities. IMPRESSION: Possible mild opacity in the lateral left lung base. Recommend follow-up to resolution. No other acute abnormalities. Electronically Signed   By: Dorise Bullion III M.D   On: 10/26/2016 14:26    Scheduled Meds: . arformoterol  15 mcg Nebulization BID  . azithromycin  500 mg Oral Daily  . budesonide (PULMICORT) nebulizer solution  0.5 mg Nebulization BID  . cefTRIAXone (ROCEPHIN)  IV  1 g Intravenous Q24H  . enoxaparin (LOVENOX) injection  40 mg Subcutaneous Q24H  . fluticasone  2 spray Each Nare Daily  . gabapentin  100 mg Oral TID  . guaiFENesin  600 mg Oral BID  . ipratropium-albuterol  3  mL Nebulization TID  . methylPREDNISolone (SOLU-MEDROL) injection  60 mg Intravenous Q8H  . montelukast  10 mg Oral Daily  . nicotine  14 mg Transdermal Daily  . pantoprazole  40 mg Oral Daily  . simvastatin  20 mg Oral q1800   Continuous Infusions:  Active Problems:   Pneumonia    Time spent: 25 minutes    Barton Dubois  [facility] Pager [phone]. If 7PM-7AM, please contact night-coverage at [email or web address], password [email or web address] 10/28/2016, 10:13 AM  LOS: 2 days

## 2016-10-28 NOTE — Evaluation (Signed)
Physical Therapy Evaluation Patient Details Name: Lori [name] MRN: [id number] DOB: 15-May-1956 Today's Date: 10/28/2016   History of Present Illness  61 y.o. female with medical history significant of COPD, asthma, and CAD presented to the emergency department complaining of shortness of breath, rhinorrhea, congestion, chills, cough, fatigue, abdominal aching and chest tightness  Clinical Impression  The patient is mobilizing well with RW. The patient will benefit from  A 4 wheeled RW at DC and HHPT. Pt admitted with above diagnosis. Pt currently with functional limitations due to the deficits listed below (see PT Problem List).  Oxygen saturation remained >97% throughout ambulation on Room air.  Pt will benefit from skilled PT to increase their independence and safety with mobility to allow discharge to the venue listed below.      Follow Up Recommendations Home health PT;Supervision - Intermittent    Equipment Recommendations   (4 wheeled RW)    Recommendations for Other Services       Precautions / Restrictions Precautions Precautions: Fall      Mobility  Bed Mobility Overal bed mobility: Independent                Transfers Overall transfer level: Needs assistance   Transfers: Sit to/from Stand;Stand Pivot Transfers Sit to Stand: Supervision Stand pivot transfers: Supervision       General transfer comment: transfers to [facility] without assistance  Ambulation/Gait Ambulation/Gait assistance: Min guard Ambulation Distance (Feet): 440 Feet Assistive device: Rolling walker (2 wheeled) Gait Pattern/deviations: Step-through pattern     General Gait Details: oxygen saturation checked at 200' at 98%, HR 104  Stairs            Wheelchair Mobility    Modified Rankin (Stroke Patients Only)       Balance Overall balance assessment: Needs assistance;History of Falls Sitting-balance support: Feet supported;No upper extremity supported Sitting  balance-Leahy Scale: Good     Standing balance support: During functional activity;Bilateral upper extremity supported Standing balance-Leahy Scale: Fair                               Pertinent Vitals/Pain Pain Assessment: No/denies pain    Home Living Family/patient expects to be discharged to:: Private residence Living Arrangements: Spouse/significant other Available Help at Discharge: Family Type of Home: Mobile home Home Access: Stairs to enter Entrance Stairs-Rails: Psychiatric nurse of Steps: 4 Home Layout: One level Home Equipment: None      Prior Function Level of Independence: Independent               Hand Dominance        Extremity/Trunk Assessment   Upper Extremity Assessment Upper Extremity Assessment: Generalized weakness;RUE deficits/detail;LUE deficits/detail RUE Deficits / Details: noted some tremors present LUE Deficits / Details: same as right    Lower Extremity Assessment Lower Extremity Assessment: Generalized weakness    Cervical / Trunk Assessment Cervical / Trunk Assessment: Normal  Communication   Communication: No difficulties  Cognition Arousal/Alertness: Awake/alert Behavior During Therapy: WFL for tasks assessed/performed Overall Cognitive Status: Within Functional Limits for tasks assessed                      General Comments      Exercises     Assessment/Plan    PT Assessment Patient needs continued PT services  PT Problem List Decreased strength;Decreased mobility;Cardiopulmonary status limiting activity;Decreased knowledge of precautions;Decreased knowledge of use of DME  PT Treatment Interventions DME instruction;Gait training;Functional mobility training;Therapeutic activities;Therapeutic exercise;Patient/family education    PT Goals (Current goals can be found in the Care Plan section)  Acute Rehab PT Goals Patient Stated Goal: to go home PT Goal Formulation:  With patient Time For Goal Achievement: 11/04/16 Potential to Achieve Goals: Good    Frequency Min 3X/week   Barriers to discharge        Co-evaluation               End of Session Equipment Utilized During Treatment: Gait belt Activity Tolerance: Patient tolerated treatment well Patient left: in chair;with chair alarm set;with call bell/phone within reach Nurse Communication: Mobility status         Time: [id number] PT Time Calculation (min) (ACUTE ONLY): 22 min   Charges:   PT Evaluation $PT Eval Low Complexity: 1 Procedure     PT G CodesClaretha Cooper 10/28/2016, 10:02 AM  Tresa Endo PT [phone]

## 2016-10-28 NOTE — Progress Notes (Signed)
Abstracted result and sent to scan  

## 2016-10-29 ENCOUNTER — Encounter (HOSPITAL_COMMUNITY): Payer: Self-pay

## 2016-10-29 DIAGNOSIS — R05 Cough: Secondary | ICD-10-CM

## 2016-10-29 DIAGNOSIS — K219 Gastro-esophageal reflux disease without esophagitis: Secondary | ICD-10-CM

## 2016-10-29 DIAGNOSIS — R059 Cough, unspecified: Secondary | ICD-10-CM

## 2016-10-29 DIAGNOSIS — J189 Pneumonia, unspecified organism: Secondary | ICD-10-CM

## 2016-10-29 LAB — BASIC METABOLIC PANEL
Anion gap: 7 (ref 5–15)
BUN: 19 mg/dL (ref 6–20)
CHLORIDE: 105 mmol/L (ref 101–111)
CO2: 25 mmol/L (ref 22–32)
CREATININE: 0.86 mg/dL (ref 0.44–1.00)
Calcium: 8.5 mg/dL — ABNORMAL LOW (ref 8.9–10.3)
GFR calc Af Amer: >60 mL/min (ref >60)
GFR calc non Af Amer: >60 mL/min (ref >60)
Glucose, Bld: 153 mg/dL — ABNORMAL HIGH (ref 65–99)
POTASSIUM: 4.8 mmol/L (ref 3.5–5.1)
SODIUM: 137 mmol/L (ref 135–145)

## 2016-10-29 LAB — MAGNESIUM: Magnesium: 2.3 mg/dL (ref 1.7–2.4)

## 2016-10-29 MED ORDER — LORAZEPAM 0.5 MG PO TABS: 0.5000 mg | ORAL_TABLET | Freq: Three times a day (TID) | ORAL | 0 refills | Status: DC | PRN

## 2016-10-29 MED ORDER — TRAMADOL HCL 50 MG PO TABS: 50.0000 mg | ORAL_TABLET | Freq: Three times a day (TID) | ORAL | 0 refills | Status: DC | PRN

## 2016-10-29 MED ORDER — ALBUTEROL SULFATE HFA 108 (90 BASE) MCG/ACT IN AERS: 2.0000 | INHALATION_SPRAY | Freq: Four times a day (QID) | RESPIRATORY_TRACT | 3 refills | Status: DC | PRN

## 2016-10-29 MED ORDER — PREDNISONE 20 MG PO TABS: ORAL_TABLET | ORAL | 0 refills | Status: DC

## 2016-10-29 MED ORDER — TIOTROPIUM BROMIDE MONOHYDRATE 18 MCG IN CAPS: 18.0000 ug | ORAL_CAPSULE | Freq: Every day | RESPIRATORY_TRACT | 3 refills | Status: DC

## 2016-10-29 MED ORDER — BENZONATATE 100 MG PO CAPS: 200.0000 mg | ORAL_CAPSULE | Freq: Three times a day (TID) | ORAL | 0 refills | Status: DC | PRN

## 2016-10-29 MED ORDER — NICOTINE 14 MG/24HR TD PT24: 14.0000 mg | MEDICATED_PATCH | Freq: Every day | TRANSDERMAL | 0 refills | Status: DC

## 2016-10-29 MED ORDER — IPRATROPIUM-ALBUTEROL 0.5-2.5 (3) MG/3ML IN SOLN: 3.0000 mL | Freq: Two times a day (BID) | RESPIRATORY_TRACT | Status: DC

## 2016-10-29 MED ORDER — CEFDINIR 300 MG PO CAPS: 300.0000 mg | ORAL_CAPSULE | Freq: Two times a day (BID) | ORAL | 0 refills | Status: DC

## 2016-10-29 NOTE — Progress Notes (Signed)
Nursing Discharge Summary  Patient ID: Lori [name] MRN: [id number] DOB/AGE: 1956-05-09 61 y.o.  Admit date: 10/26/2016 Discharge date: 10/29/2016  Discharged Condition: good  Disposition: 01-Home or [place], NP. Schedule an appointment as soon as possible for a visit in 10 day(s).   Specialty:  Internal Medicine Contact information: [address] [address] [phone]           Prescriptions Given: Patient medications and follow up appointments discussed and patient verbalized understanding without further questions.  Patient prescriptions and rolling walker given.    Means of Discharge: Patient to be taken downstairs via wheelchair to be discharged home vai private vehicle.   Signed: Buel Ream 10/29/2016, 10:45 AM

## 2016-10-29 NOTE — Discharge Summary (Signed)
Physician Discharge Summary  Lori [name] [id number] DOB: 07/14/1956 DOA: 10/26/2016  PCP: Wilfred Lacy, NP  Admit date: 10/26/2016 Discharge date: 10/29/2016  Time spent: 35 minutes  Recommendations for Outpatient Follow-up:  Reassess BP and adjust antihypertensive regimen as needed Please arrange follow up with pulmonary service for PFT's and adjustment on maintenance treatment Patient will need close follow up and assistance with smoking cessation Repeat CXR in 4 weeks to assure resolution of infiltrates  Discharge Diagnoses:  COPD/Asthma exacerbation Community acquired pneumonia Tobacco abuse HLD Hx of CAD Anxiety Physical deconditioning  Neuropathic pain    Discharge Condition: stable and improved. Discharge home with instructions to follow up with PCP in 10 days.  Diet recommendation: heart healthy diet   Filed Weights   10/26/16 1709  Weight: 62.1 kg (136 lb 12.8 oz)    History of present illness:  61 y.o.femalewith medical history significant of COPD, asthma, and CAD presented to the emergency department complaining of shortness of breath, rhinorrhea, congestion,chills, cough, fatigue, abdominal aching and chest tightness. Symptoms started 2 days ago but shortness of breath have been progressively worsened to the point that she cannot walk without getting "heavy breathing". Patient have use her inhalers at home with no improvement. Chest pain is sharp in origin under her right breast, 6-8 out of 10, that is worse with deep inspiration. No other symptoms at this time. Patient is not oxygen dependent and is currently a smoker.  Hospital Course:  Left lower lobe pneumonia leading to COPD exacerbation. -will discharge on steroids tapering -Cefdinir antibiotic therapy -also with instructions to use mucolytics and to keep herself well hydrated -patient advise to quit smoking -continue home inhaler regimen and added spiriva (see below) -at discharge not needing  oxygen supplementation   COPD/Asthmaexacerbation  -improved and responding well to therapy -patient will benefit of assessment from pulmonary service at discharge, for PFT's and adjustment on maintenance therapy. -discharge on spiriva and resumed her Dulera -will finish steroids tapering and continue PRN albuterol  CAD- stable, chest pain atypical; no significant signs for ACS at this time EKG with no changes. -troponins neg -continue statins and ASA. -continue PRN NTG  Tobacco abuse -Tobacco cessation discussed -nicoderm patch ordered  -patient motivated and will like to quit  Anxiety -will continue use PRN ativan  HLD -will continue statins  Physical deconditioning  -Per PT assessment and recommendations will arrange for home health PT services and rolling walker.  neuropathic pain -will continue neurontin TID  Procedures:  See below for x-ray reports   Consultations:  None   Discharge Exam: Vitals:   10/28/16 2116 10/29/16 0444  BP: 104/78 122/69  Pulse: 89 78  Resp: 18 16  Temp: 98.6 F (37 C) 98.1 F (36.7 C)    General: afebrile. Patient reports feeling a lot better and denies CP. Breathing a lot easier and not requiring oxygen supplementation. Speaking in full sentences at discharge.  Cardiovascular: S1 and s2, no rubs or gallops  Respiratory: good air movement, scattered rhonchi and just very mild exp wheezing  Abdomen: soft, ND, positive BS, no guarding   Musculoskeletal: no edema, no cyanosis    Discharge Instructions   Discharge Instructions    Diet - low sodium heart healthy    Complete by:  As directed    Discharge instructions    Complete by:  As directed    Take medications as prescribed Follow up with PCP in 10 days Keep yourself well hydrated Follow a heart healthy diet  Stop smoking     Current Discharge Medication List    START taking these medications   Details  cefdinir (OMNICEF) 300 MG capsule Take 1 capsule  (300 mg total) by mouth 2 (two) times daily. Qty: 14 capsule, Refills: 0    LORazepam (ATIVAN) 0.5 MG tablet Take 1 tablet (0.5 mg total) by mouth every 8 (eight) hours as needed for anxiety. Qty: 20 tablet, Refills: 0    nicotine (NICODERM CQ - DOSED IN MG/24 HOURS) 14 mg/24hr patch Place 1 patch (14 mg total) onto the skin daily. Qty: 28 patch, Refills: 0    predniSONE (DELTASONE) 20 MG tablet Take 3 tablets by mouth daily X 1 day; then 2 tablets by mouth daily X 2 days; then 1 tablet by mouth daily X 3 days; then 1/2 tablet by mouth daily X 3 days and stop prednisone. Qty: 12 tablet, Refills: 0    tiotropium (SPIRIVA HANDIHALER) 18 MCG inhalation capsule Place 1 capsule (18 mcg total) into inhaler and inhale daily. Qty: 30 capsule, Refills: 3    traMADol (ULTRAM) 50 MG tablet Take 1 tablet (50 mg total) by mouth every 8 (eight) hours as needed for severe pain. Qty: 20 tablet, Refills: 0      CONTINUE these medications which have CHANGED   Details  albuterol (PROVENTIL HFA;VENTOLIN HFA) 108 (90 Base) MCG/ACT inhaler Inhale 2 puffs into the lungs every 6 (six) hours as needed for wheezing or shortness of breath. Qty: 1 Inhaler, Refills: 3    benzonatate (TESSALON) 100 MG capsule Take 2 capsules (200 mg total) by mouth 3 (three) times daily as needed (excessive coughing). Qty: 25 capsule, Refills: 0      CONTINUE these medications which have NOT CHANGED   Details  butalbital-acetaminophen-caffeine (FIORICET, ESGIC) 50-325-40 MG tablet Take 1 tablet by mouth every 6 (six) hours as needed for headache.    cyclobenzaprine (FLEXERIL) 5 MG tablet Take 1 tablet (5 mg total) by mouth 3 (three) times daily as needed for muscle spasms. Qty: 30 tablet, Refills: 0   Associated Diagnoses: Spasm of lumbar paraspinous muscle    dexlansoprazole (DEXILANT) 60 MG capsule Take 1 capsule (60 mg total) by mouth daily. Qty: 30 capsule, Refills: 3   Associated Diagnoses: Gastroesophageal reflux  disease with esophagitis    DULERA 200-5 MCG/ACT AERO Inhale 1 puff into the lungs 2 (two) times daily.    fluticasone (FLONASE) 50 MCG/ACT nasal spray Place 2 sprays into both nostrils daily. Qty: 16 g, Refills: 0   Associated Diagnoses: Acute non-recurrent pansinusitis    meclizine (ANTIVERT) 25 MG tablet Take 1 tablet (25 mg total) by mouth 3 (three) times daily as needed for dizziness. Only as needed for DIZZINESS Qty: 21 tablet, Refills: 0   Associated Diagnoses: Vertigo    montelukast (SINGULAIR) 10 MG tablet Take 1 tablet (10 mg total) by mouth daily. Qty: 30 tablet, Refills: 5   Associated Diagnoses: Chronic nonseasonal allergic rhinitis due to other allergen    valACYclovir (VALTREX) 500 MG tablet Take 500 mg by mouth daily.    acetaminophen (TYLENOL) 500 MG tablet Take 2 tablets (1,000 mg total) by mouth every 6 (six) hours as needed. Qty: 30 tablet, Refills: 0    EPINEPHrine 0.3 mg/0.3 mL IJ SOAJ injection Use as directed for severe allergic reaction Qty: 2 Device, Refills: 1    Fluticasone Furoate (ARNUITY ELLIPTA) 200 MCG/ACT AEPB Inhale 1 puff into the lungs daily. Qty: 30 each, Refills: 5    gabapentin (NEURONTIN)  100 MG capsule Take 1 capsule (100 mg total) by mouth as directed. Qty: 90 capsule, Refills: 3   Associated Diagnoses: Spasm of lumbar paraspinous muscle    nitroGLYCERIN (NITROSTAT) 0.4 MG SL tablet Place 1 tablet (0.4 mg total) under the tongue every 5 (five) minutes as needed for chest pain. Qty: 25 tablet, Refills: 3    ondansetron (ZOFRAN) 4 MG tablet Take 1 tablet (4 mg total) by mouth every 8 (eight) hours as needed for nausea or vomiting. One tab 30 mintues before each bowel prep Qty: 20 tablet, Refills: 0   Associated Diagnoses: Gastroparesis    simvastatin (ZOCOR) 20 MG tablet TAKE 1 TABLET BY MOUTH EVERY EVENING BEFORE BEDTIME FOR CHOLESTEROL Qty: 30 tablet, Refills: 4   Associated Diagnoses: Mixed hyperlipidemia      STOP taking these  medications     citalopram (CELEXA) 20 MG tablet      loperamide (IMODIUM) 2 MG capsule        Allergies  Allergen Reactions  . Bee Venom Swelling  . Codeine Nausea Only    CAUSES ULCERS  . Ibuprofen Nausea And Vomiting  . Levofloxacin Nausea Only  . Propoxyphene N-Acetaminophen Nausea Only  . Toradol [Ketorolac Tromethamine] Nausea And Vomiting  . Hydrocodone-Acetaminophen Nausea Only    REACTION: "sick"  . Latex Rash   Follow-up Information    Wilfred Lacy, NP. Schedule an appointment as soon as possible for a visit in 10 day(s).   Specialty:  Internal Medicine Contact information: [address] [place] [address] [phone]            The results of significant diagnostics from this hospitalization (including imaging, microbiology, ancillary and laboratory) are listed below for reference.    Significant Diagnostic Studies: Dg Chest 2 View  Result Date: 10/26/2016 CLINICAL DATA:  Chest pain.  Cold and chills. EXAM: CHEST  2 VIEW COMPARISON:  October 19, 2016 FINDINGS: The cardiomediastinal silhouette is stable. Prominent epicardial fat adjacent to the right side of the heart, unchanged since January 2017. Left greater than right pleuroparenchymal thickening in the apices, unchanged. No pneumothorax. Mild opacity in the lateral left lung base on only the frontal view. No overt edema. No other abnormalities. IMPRESSION: Possible mild opacity in the lateral left lung base. Recommend follow-up to resolution. No other acute abnormalities. Electronically Signed   By: Dorise Bullion III M.D   On: 10/26/2016 14:26   Dg Chest 2 View  Result Date: 10/19/2016 CLINICAL DATA:  Asthma, chest pain and shortness of breath. EXAM: CHEST  2 VIEW COMPARISON:  10/15/2015 FINDINGS: The heart size and mediastinal contours are within normal limits. Stable chronic lung disease. No significant hyperinflation. There is no evidence of pulmonary edema, consolidation, pneumothorax, nodule or  pleural fluid. The visualized skeletal structures are unremarkable. IMPRESSION: Stable chronic lung disease.  No acute findings. Electronically Signed   By: Aletta Edouard M.D.   On: 10/19/2016 14:09    Microbiology: Recent Results (from the past 240 hour(s))  Culture, blood (routine x 2)     Status: None (Preliminary result)   Collection Time: 10/26/16  6:12 PM  Result Value Ref Range Status   Specimen Description BLOOD LEFT ANTECUBITAL  Final   Special Requests BOTTLES DRAWN AEROBIC ONLY 5CC  Final   Culture   Final    NO GROWTH 1 DAY Performed at [facility], [address] [place], [place] [address]    Report Status PENDING  Incomplete  Culture, blood (routine  x 2)     Status: None (Preliminary result)   Collection Time: 10/26/16  6:12 PM  Result Value Ref Range Status   Specimen Description BLOOD LEFT FOREARM  Final   Special Requests IN PEDIATRIC BOTTLE 2CC  Final   Culture   Final    NO GROWTH 1 DAY Performed at [facility], [place] [address], [place],  [address]    Report Status PENDING  Incomplete     Labs: Basic Metabolic Panel:  Recent Labs Lab 10/26/16 1154 10/26/16 1812 10/27/16 0356 10/29/16 0418  NA 137  --  136 137  K 3.4*  --  4.0 4.8  CL 106  --  107 105  CO2 24  --  18* 25  GLUCOSE 100*  --  205* 153*  BUN 13  --  13 19  CREATININE 0.79  --  0.85 0.86  CALCIUM 8.8*  --  8.8* 8.5*  MG  --  1.5*  --  2.3   Liver Function Tests:  Recent Labs Lab 10/26/16 1154  AST 15  ALT 29  ALKPHOS 114  BILITOT 0.4  PROT 6.8  ALBUMIN 3.9    Recent Labs Lab 10/26/16 1154  LIPASE 17   No results for input(s): AMMONIA in the last 168 hours. CBC:  Recent Labs Lab 10/26/16 1154 10/27/16 0356  WBC 12.8* 10.9*  HGB 12.1 10.9*  HCT 35.2* 32.4*  MCV 83.4 83.1  PLT 315 268   Cardiac Enzymes: No results for input(s): CKTOTAL, CKMB, CKMBINDEX, TROPONINI in the last 168 hours. BNP: BNP (last 3 results) No results for input(s):  BNP in the last 8760 hours.  ProBNP (last 3 results) No results for input(s): PROBNP in the last 8760 hours.  CBG: No results for input(s): GLUCAP in the last 168 hours.     Signed:  Barton Dubois MD.  [facility] 10/29/2016, 10:16 AM

## 2016-10-31 ENCOUNTER — Telehealth: Payer: Self-pay | Admitting: Nurse Practitioner

## 2016-10-31 DIAGNOSIS — I2510 Atherosclerotic heart disease of native coronary artery without angina pectoris: Secondary | ICD-10-CM | POA: Diagnosis not present

## 2016-10-31 DIAGNOSIS — J441 Chronic obstructive pulmonary disease with (acute) exacerbation: Secondary | ICD-10-CM | POA: Diagnosis not present

## 2016-10-31 DIAGNOSIS — Z7952 Long term (current) use of systemic steroids: Secondary | ICD-10-CM | POA: Diagnosis not present

## 2016-10-31 DIAGNOSIS — E785 Hyperlipidemia, unspecified: Secondary | ICD-10-CM | POA: Diagnosis not present

## 2016-10-31 DIAGNOSIS — G8929 Other chronic pain: Secondary | ICD-10-CM | POA: Diagnosis not present

## 2016-10-31 DIAGNOSIS — Z8719 Personal history of other diseases of the digestive system: Secondary | ICD-10-CM | POA: Diagnosis not present

## 2016-10-31 DIAGNOSIS — J189 Pneumonia, unspecified organism: Secondary | ICD-10-CM | POA: Diagnosis not present

## 2016-10-31 DIAGNOSIS — M545 Low back pain: Secondary | ICD-10-CM | POA: Diagnosis not present

## 2016-10-31 DIAGNOSIS — Z79891 Long term (current) use of opiate analgesic: Secondary | ICD-10-CM | POA: Diagnosis not present

## 2016-10-31 DIAGNOSIS — Z9181 History of falling: Secondary | ICD-10-CM | POA: Diagnosis not present

## 2016-10-31 DIAGNOSIS — D649 Anemia, unspecified: Secondary | ICD-10-CM | POA: Diagnosis not present

## 2016-10-31 NOTE — Telephone Encounter (Signed)
Patient was in hospital.  Referral obtained.  When PT arrived for planned first visit.  Patient did not answer door or phone.  Will attempt to continue to reach patient.

## 2016-10-31 NOTE — Telephone Encounter (Signed)
FYI

## 2016-11-01 ENCOUNTER — Telehealth: Payer: Self-pay | Admitting: Nurse Practitioner

## 2016-11-01 LAB — CULTURE, BLOOD (ROUTINE X 2)
Culture: NO GROWTH
Culture: NO GROWTH

## 2016-11-01 NOTE — Telephone Encounter (Signed)
Clair Gulling with [facility]  [phone]  Need verbal orders for:   PT  1 week 2 fall risk reduction  Pt had reduced lower lung sounds , poor health literacy  poor med organization, likely not taking meds correctly.  Pt request info on a diet   Need verbals for: Nursing for items listed above

## 2016-11-02 ENCOUNTER — Encounter: Payer: Self-pay | Admitting: Family Medicine

## 2016-11-02 ENCOUNTER — Ambulatory Visit (INDEPENDENT_AMBULATORY_CARE_PROVIDER_SITE_OTHER): Payer: Medicare Other | Admitting: Family Medicine

## 2016-11-02 VITALS — BP 120/88 | HR 70 | Temp 98.7°F | Wt 138.1 lb

## 2016-11-02 DIAGNOSIS — R05 Cough: Secondary | ICD-10-CM | POA: Diagnosis not present

## 2016-11-02 DIAGNOSIS — R059 Cough, unspecified: Secondary | ICD-10-CM

## 2016-11-02 DIAGNOSIS — J181 Lobar pneumonia, unspecified organism: Secondary | ICD-10-CM

## 2016-11-02 DIAGNOSIS — J189 Pneumonia, unspecified organism: Secondary | ICD-10-CM

## 2016-11-02 MED ORDER — PROMETHAZINE-DM 6.25-15 MG/5ML PO SYRP: 5.0000 mL | ORAL_SOLUTION | Freq: Four times a day (QID) | ORAL | 0 refills | Status: DC | PRN

## 2016-11-02 NOTE — Assessment & Plan Note (Signed)
Cough persisting despite tessalon. Asking for something stronger. Advised that cough can take weeks to resolve and she should seriously consider smoking cessation. Promethazine DM rx sent to pharmacy. Discussed sedation precautions.

## 2016-11-02 NOTE — Progress Notes (Signed)
Subjective:   Patient ID: Lori [name], female    DOB: 01-May-1956, 61 y.o.   MRN: [id number]  Lori [name] is a pleasant 61 y.o. year old female pt, new to me, with h/o tobacco abuse and COPD, who presents to weekend clinic today with Cough (congestion, dx with pneumonia at hospital 10/26/16)  on 11/02/2016  HPI:  Was in the hospital this week for pneumonia. Notes reviewed- admitted 2/10- 10/29/16 after presenting with progressive SOB, congestion, cough, chills and fatigue.  H/o COPD and was complaining of SOB despite using her inhalers.  CXR concerning for Left lateral PNA.  Responded well to IV abx, breathing treatments.  Sent home with cefdinir and prednsione taper (which she is still taking), spiriva and advised to continue [place].  Overall, feels better.  Less SOB, smoking again.  Cough is what is most bothersome and why she is here. Tessalon is not helping.  Asking for something stronger.  Dg Chest 2 View  Result Date: 10/26/2016 CLINICAL DATA:  Chest pain.  Cold and chills. EXAM: CHEST  2 VIEW COMPARISON:  October 19, 2016 FINDINGS: The cardiomediastinal silhouette is stable. Prominent epicardial fat adjacent to the right side of the heart, unchanged since January 2017. Left greater than right pleuroparenchymal thickening in the apices, unchanged. No pneumothorax. Mild opacity in the lateral left lung base on only the frontal view. No overt edema. No other abnormalities. IMPRESSION: Possible mild opacity in the lateral left lung base. Recommend follow-up to resolution. No other acute abnormalities. Electronically Signed   By: Dorise Bullion III M.D   On: 10/26/2016 14:26   Dg Chest 2 View  Result Date: 10/19/2016 CLINICAL DATA:  Asthma, chest pain and shortness of breath. EXAM: CHEST  2 VIEW COMPARISON:  10/15/2015 FINDINGS: The heart size and mediastinal contours are within normal limits. Stable chronic lung disease. No significant hyperinflation. There is no evidence of  pulmonary edema, consolidation, pneumothorax, nodule or pleural fluid. The visualized skeletal structures are unremarkable. IMPRESSION: Stable chronic lung disease.  No acute findings. Electronically Signed   By: Aletta Edouard M.D.   On: 10/19/2016 14:09   Current Outpatient Prescriptions on File Prior to Visit  Medication Sig Dispense Refill  . acetaminophen (TYLENOL) 500 MG tablet Take 2 tablets (1,000 mg total) by mouth every 6 (six) hours as needed. 30 tablet 0  . albuterol (PROVENTIL HFA;VENTOLIN HFA) 108 (90 Base) MCG/ACT inhaler Inhale 2 puffs into the lungs every 6 (six) hours as needed for wheezing or shortness of breath. 1 Inhaler 3  . benzonatate (TESSALON) 100 MG capsule Take 2 capsules (200 mg total) by mouth 3 (three) times daily as needed (excessive coughing). 25 capsule 0  . butalbital-acetaminophen-caffeine (FIORICET, ESGIC) 50-325-40 MG tablet Take 1 tablet by mouth every 6 (six) hours as needed for headache.    . cefdinir (OMNICEF) 300 MG capsule Take 1 capsule (300 mg total) by mouth 2 (two) times daily. 14 capsule 0  . cyclobenzaprine (FLEXERIL) 5 MG tablet Take 1 tablet (5 mg total) by mouth 3 (three) times daily as needed for muscle spasms. 30 tablet 0  . dexlansoprazole (DEXILANT) 60 MG capsule Take 1 capsule (60 mg total) by mouth daily. 30 capsule 3  . DULERA 200-5 MCG/ACT AERO Inhale 1 puff into the lungs 2 (two) times daily.    Marland Kitchen EPINEPHrine 0.3 mg/0.3 mL IJ SOAJ injection Use as directed for severe allergic reaction 2 Device 1  . fluticasone (FLONASE) 50 MCG/ACT nasal spray Place 2  sprays into both nostrils daily. 16 g 0  . Fluticasone Furoate (ARNUITY ELLIPTA) 200 MCG/ACT AEPB Inhale 1 puff into the lungs daily. 30 each 5  . gabapentin (NEURONTIN) 100 MG capsule Take 1 capsule (100 mg total) by mouth as directed. 90 capsule 3  . LORazepam (ATIVAN) 0.5 MG tablet Take 1 tablet (0.5 mg total) by mouth every 8 (eight) hours as needed for anxiety. 20 tablet 0  . meclizine  (ANTIVERT) 25 MG tablet Take 1 tablet (25 mg total) by mouth 3 (three) times daily as needed for dizziness. Only as needed for DIZZINESS 21 tablet 0  . montelukast (SINGULAIR) 10 MG tablet Take 1 tablet (10 mg total) by mouth daily. 30 tablet 5  . nicotine (NICODERM CQ - DOSED IN MG/24 HOURS) 14 mg/24hr patch Place 1 patch (14 mg total) onto the skin daily. 28 patch 0  . nitroGLYCERIN (NITROSTAT) 0.4 MG SL tablet Place 1 tablet (0.4 mg total) under the tongue every 5 (five) minutes as needed for chest pain. 25 tablet 3  . ondansetron (ZOFRAN) 4 MG tablet Take 1 tablet (4 mg total) by mouth every 8 (eight) hours as needed for nausea or vomiting. One tab 30 mintues before each bowel prep 20 tablet 0  . predniSONE (DELTASONE) 20 MG tablet Take 3 tablets by mouth daily X 1 day; then 2 tablets by mouth daily X 2 days; then 1 tablet by mouth daily X 3 days; then 1/2 tablet by mouth daily X 3 days and stop prednisone. 12 tablet 0  . simvastatin (ZOCOR) 20 MG tablet TAKE 1 TABLET BY MOUTH EVERY EVENING BEFORE BEDTIME FOR CHOLESTEROL 30 tablet 4  . tiotropium (SPIRIVA HANDIHALER) 18 MCG inhalation capsule Place 1 capsule (18 mcg total) into inhaler and inhale daily. 30 capsule 3  . traMADol (ULTRAM) 50 MG tablet Take 1 tablet (50 mg total) by mouth every 8 (eight) hours as needed for severe pain. 20 tablet 0  . valACYclovir (VALTREX) 500 MG tablet Take 500 mg by mouth daily.     No current facility-administered medications on file prior to visit.     Allergies  Allergen Reactions  . Bee Venom Swelling  . Codeine Nausea Only    CAUSES ULCERS  . Ibuprofen Nausea And Vomiting  . Levofloxacin Nausea Only  . Propoxyphene N-Acetaminophen Nausea Only  . Toradol [Ketorolac Tromethamine] Nausea And Vomiting  . Hydrocodone-Acetaminophen Nausea Only    REACTION: "sick"  . Latex Rash    Past Medical History:  Diagnosis Date  . Acute MI    x3  . Allergy   . Anemia   . Ankle fracture 05/2016  . Anxiety     . Arthritis   . Asthma   . Barrett esophagus 2007  . Chest pain 05-02-2009   echo  EF 55%  . COPD (chronic obstructive pulmonary disease) with chronic bronchitis (HCC)    & Emphysema  . Depression   . Duodenitis without mention of hemorrhage 2007  . Esophageal reflux 2007  . Esophageal stricture   . Full dentures   . H/O cardiovascular stress test 05-02-2009   low risk scan, neg. for ischemia, no ekg changes  . H/O hiatal hernia   . Hiatal hernia [id number]  . Hyperlipidemia   . Multiple fractures    from falls, fx rt. elbow, fx left wrist, bilateral ankles  . Ulcer [facility])     Past Surgical History:  Procedure Laterality Date  . ABDOMINAL HYSTERECTOMY    . CHOLECYSTECTOMY    .  CHONDROPLASTY Left 01/06/2015   Procedure: CHONDROPLASTY;  Surgeon: Marybelle Killings, MD;  Location: [place];  Service: Orthopedics;  Laterality: Left;  . COLONOSCOPY    . ELBOW FRACTURE SURGERY     right  . KNEE ARTHROSCOPY WITH EXCISION PLICA Left 0000000   Procedure: KNEE ARTHROSCOPY WITH EXCISION PLICA;  Surgeon: Marybelle Killings, MD;  Location: [place];  Service: Orthopedics;  Laterality: Left;  . Lower Extremity Arterial Dopplers  07/06/2013   RABI 1.0, LABI 1.1.; No evidence of significant vascular atherogenic plaque  . NECK SURGERY    . NM MYOVIEW LTD  07/02/2013   Normal LV function, EF 83%. (Likely overestimated due to low volume);; no evidence of ischemia or infarction. Normal wall motion.  . TONSILLECTOMY    . WRIST FRACTURE SURGERY     left, has plate    Family History  Problem Relation Age of Onset  . Emphysema Father   . Dementia Mother   . Diabetes Maternal Grandmother   . Diabetes Maternal Aunt   . Diabetes      mat. cousin  . Heart disease Brother   . Colon cancer Neg Hx   . Allergic rhinitis Neg Hx   . Angioedema Neg Hx   . Asthma Neg Hx   . Atopy Neg Hx   . Eczema Neg Hx   . Immunodeficiency Neg Hx   . Urticaria Neg Hx     Social  History   Social History  . Marital status: Married    Spouse name: [name]  . Number of children: 2  . Years of education: HS   Occupational History  . disable    Social History Main Topics  . Smoking status: Current Every Day Smoker    Packs/day: 1.50    Years: 21.00    Types: Cigarettes  . Smokeless tobacco: Never Used     Comment: tobaccco info given 03/06/12, 1.5 PPD 05/15/15  . Alcohol use No  . Drug use: No  . Sexual activity: No   Other Topics Concern  . Not on file   Social History Narrative   Patient lives at home spouse - [name].  Has 2 daughters (26 & 22).   Currently "disabled".   Caffeine Use: tea 4 glasses daily.    Smokes ~1 1/2 PPD  05/15/15   Walks everyday - no set routine.   The PMH, PSH, Social History, Family History, Medications, and allergies have been reviewed in [facility], and have been updated if relevant.    Review of Systems  Constitutional: Negative.   HENT: Positive for congestion.   Respiratory: Positive for cough. Negative for shortness of breath and wheezing.   Cardiovascular: Negative.   Gastrointestinal: Negative.   Endocrine: Negative.   Genitourinary: Negative.   Musculoskeletal: Negative.   Hematological: Negative.   Psychiatric/Behavioral: Negative.   All other systems reviewed and are negative.      Objective:    BP 120/88   Pulse 70   Temp 98.7 F (37.1 C) (Oral)   Wt 138 lb 1.9 oz (62.7 kg)   SpO2 95%   BMI 23.71 kg/m    Physical Exam  Constitutional: She is oriented to person, place, and time. She appears well-developed and well-nourished. No distress.  HENT:  Head: Normocephalic.  Eyes: Conjunctivae are normal.  Cardiovascular: Normal rate and regular rhythm.   Pulmonary/Chest: Effort normal and breath sounds normal. No respiratory distress. She has no wheezes. She has no rales.  +harsh  wet cough  Musculoskeletal: Normal range of motion.  Neurological: She is alert and oriented to person, place, and time. No  cranial nerve deficit.  Skin: Skin is warm and dry. She is not diaphoretic.  Psychiatric: She has a normal mood and affect. Her behavior is normal. Judgment and thought content normal.  Nursing note and vitals reviewed.         Assessment & Plan:   Community acquired pneumonia of left lower lobe of lung ([place])  Cough No Follow-up on file.

## 2016-11-02 NOTE — Assessment & Plan Note (Signed)
Lung exam reassuring. Finish abx, steroids, continue inhalers.

## 2016-11-02 NOTE — Telephone Encounter (Signed)
Ok to proceed with PT orders  For diet, she need to maintain low salt/low fat diet.

## 2016-11-02 NOTE — Patient Instructions (Signed)
Finish antibiotics and prednisone as directed.  Promethazine DM- as needed for cough.  This will make you sleepy.  Keep your follow up appointment with your doctor on Tuesday.

## 2016-11-02 NOTE — Progress Notes (Signed)
Pre visit review using our clinic review tool, if applicable. No additional management support is needed unless otherwise documented below in the visit note. 

## 2016-11-04 NOTE — Telephone Encounter (Signed)
Left message on jims cell phone, call back if any questions

## 2016-11-05 ENCOUNTER — Ambulatory Visit (INDEPENDENT_AMBULATORY_CARE_PROVIDER_SITE_OTHER): Payer: Medicare Other | Admitting: Family

## 2016-11-05 ENCOUNTER — Encounter: Payer: Self-pay | Admitting: Family

## 2016-11-05 VITALS — BP 118/76 | HR 94 | Temp 99.2°F | Resp 18 | Ht 64.0 in | Wt 139.0 lb

## 2016-11-05 DIAGNOSIS — J441 Chronic obstructive pulmonary disease with (acute) exacerbation: Secondary | ICD-10-CM | POA: Diagnosis not present

## 2016-11-05 DIAGNOSIS — J189 Pneumonia, unspecified organism: Secondary | ICD-10-CM

## 2016-11-05 DIAGNOSIS — J181 Lobar pneumonia, unspecified organism: Secondary | ICD-10-CM

## 2016-11-05 MED ORDER — CEFDINIR 300 MG PO CAPS: 300.0000 mg | ORAL_CAPSULE | Freq: Two times a day (BID) | ORAL | 0 refills | Status: DC

## 2016-11-05 NOTE — Assessment & Plan Note (Signed)
COPD exacerbation appears to be improve with current medication and no adverse side effects. Hospitalist recommends follow up with pulmonology for PFTs and medication management with referral placed. Follow up if symptoms worsen or do not improve.

## 2016-11-05 NOTE — Patient Instructions (Signed)
Thank you for choosing [facility].  SUMMARY AND INSTRUCTIONS:  Medication:  Please continue to take your medications as prescribed.   We will continue your anabiotic x 5 days.   Your prescription(s) have been submitted to your pharmacy or been printed and provided for you. Please take as directed and contact our office if you believe you are having problem(s) with the medication(s) or have any questions.  Labs:  Please stop by the lab on the lower level of the building for your blood work. Your results will be released to [facility] (or called to you) after review, usually within 72 hours after test completion. If any changes need to be made, you will be notified at that same time.  1.) The lab is open from 7:30am to 5:30 pm Monday-Friday 2.) No appointment is necessary 3.) Fasting (if needed) is 6-8 hours after food and drink; black coffee and water are okay   Follow up:  If your symptoms worsen or fail to improve, please contact our office for further instruction, or in case of emergency go directly to the emergency room at the closest medical facility.

## 2016-11-05 NOTE — Progress Notes (Signed)
Subjective:    Patient ID: Lori [name], female    DOB: 01/12/1956, 61 y.o.   MRN: [id number]  Chief Complaint  Patient presents with  . Hospitalization Follow-up    still feeling some congestion in chest, sweating at night and in the morning    HPI:  Lori [name] is a 61 y.o. female who  has a past medical history of Acute MI; Allergy; Anemia; Ankle fracture (05/2016); Anxiety; Arthritis; Asthma; Barrett esophagus (2007); Chest pain (05-02-2009); COPD (chronic obstructive pulmonary disease) with chronic bronchitis ([place]); Depression; Duodenitis without mention of hemorrhage (2007); Esophageal reflux (2007); Esophageal stricture; Full dentures; H/O cardiovascular stress test (05-02-2009); H/O hiatal hernia; Hiatal hernia [id number]); Hyperlipidemia; Multiple fractures; and Ulcer ([place]). and presents today for a follow up office visit.    with multiple complaints including rhinorrhea, congestion, cough, chills, fever, abdominal ache, nausea, vomiting, and decreased fluid intake. She also noted severe shortness of breath progressively worsening over the 24 hours prior to presentation to the emergency department.  Blood work with no significant Abnormality. Chest x-ray showed possible mild opacity in the lateral left lung base.  Symptoms were concerning for pneumonia. She was given a DuoNeb and steroids for wheezing with concern for exacerbation of her COPD. Started on Rocephin and azithromycin.  She was treated for left lower lobe pneumonia leading to COPD exacerbation. Started on cefdinir for antibiotic therapy with instructions to use mucolytics and keep herself well hydrated. Sh also encouraged to quit smoking. Symptoms gradually improved wash bonded well to therapy with recommended follow-up with pulmonary for pulmonary function tests and  maintain therapy for COPD. She was given a steroid taper and when necessary albuterol. Recommended follow-up with primary care. All hospital records, labs,  and imaging were reviewed in detail.  Since leaving the hospital she  Was noted to have increased difficulty in cough and was followed up in the Saturday morning clinic with improvements noted since hospitalization. She was given a prescription for promethazine DM to assist with coughing. She notes that her symptoms were improved since starting the promethazine DM and continues to complete the previously prescribed antibiotic with no adverse side effect. She does continue to experience the associated symptom of shortness of breath on occasion and increased levels of sinus pressure. Denies fevers. She does have concern for hot flashes. Has continued to take her medications for COPD with no adverse side effects and some improvements in her breathing. Able to complete her activities of daily living at baseline and is eating and drinking well.   Allergies  Allergen Reactions  . Bee Venom Swelling  . Codeine Nausea Only    CAUSES ULCERS  . Ibuprofen Nausea And Vomiting  . Levofloxacin Nausea Only  . Propoxyphene N-Acetaminophen Nausea Only  . Toradol [Ketorolac Tromethamine] Nausea And Vomiting  . Hydrocodone-Acetaminophen Nausea Only    REACTION: "sick"  . Latex Rash      Outpatient Medications Prior to Visit  Medication Sig Dispense Refill  . acetaminophen (TYLENOL) 500 MG tablet Take 2 tablets (1,000 mg total) by mouth every 6 (six) hours as needed. 30 tablet 0  . albuterol (PROVENTIL HFA;VENTOLIN HFA) 108 (90 Base) MCG/ACT inhaler Inhale 2 puffs into the lungs every 6 (six) hours as needed for wheezing or shortness of breath. 1 Inhaler 3  . benzonatate (TESSALON) 100 MG capsule Take 2 capsules (200 mg total) by mouth 3 (three) times daily as needed (excessive coughing). 25 capsule 0  . butalbital-acetaminophen-caffeine (FIORICET, ESGIC) 50-325-40 MG tablet  Take 1 tablet by mouth every 6 (six) hours as needed for headache.    . cyclobenzaprine (FLEXERIL) 5 MG tablet Take 1 tablet (5 mg total)  by mouth 3 (three) times daily as needed for muscle spasms. 30 tablet 0  . dexlansoprazole (DEXILANT) 60 MG capsule Take 1 capsule (60 mg total) by mouth daily. 30 capsule 3  . DULERA 200-5 MCG/ACT AERO Inhale 1 puff into the lungs 2 (two) times daily.    Marland Kitchen EPINEPHrine 0.3 mg/0.3 mL IJ SOAJ injection Use as directed for severe allergic reaction 2 Device 1  . fluticasone (FLONASE) 50 MCG/ACT nasal spray Place 2 sprays into both nostrils daily. 16 g 0  . Fluticasone Furoate (ARNUITY ELLIPTA) 200 MCG/ACT AEPB Inhale 1 puff into the lungs daily. 30 each 5  . gabapentin (NEURONTIN) 100 MG capsule Take 1 capsule (100 mg total) by mouth as directed. 90 capsule 3  . LORazepam (ATIVAN) 0.5 MG tablet Take 1 tablet (0.5 mg total) by mouth every 8 (eight) hours as needed for anxiety. 20 tablet 0  . meclizine (ANTIVERT) 25 MG tablet Take 1 tablet (25 mg total) by mouth 3 (three) times daily as needed for dizziness. Only as needed for DIZZINESS 21 tablet 0  . montelukast (SINGULAIR) 10 MG tablet Take 1 tablet (10 mg total) by mouth daily. 30 tablet 5  . nicotine (NICODERM CQ - DOSED IN MG/24 HOURS) 14 mg/24hr patch Place 1 patch (14 mg total) onto the skin daily. 28 patch 0  . nitroGLYCERIN (NITROSTAT) 0.4 MG SL tablet Place 1 tablet (0.4 mg total) under the tongue every 5 (five) minutes as needed for chest pain. 25 tablet 3  . ondansetron (ZOFRAN) 4 MG tablet Take 1 tablet (4 mg total) by mouth every 8 (eight) hours as needed for nausea or vomiting. One tab 30 mintues before each bowel prep 20 tablet 0  . predniSONE (DELTASONE) 20 MG tablet Take 3 tablets by mouth daily X 1 day; then 2 tablets by mouth daily X 2 days; then 1 tablet by mouth daily X 3 days; then 1/2 tablet by mouth daily X 3 days and stop prednisone. 12 tablet 0  . promethazine-dextromethorphan (PROMETHAZINE-DM) 6.25-15 MG/5ML syrup Take 5 mLs by mouth 4 (four) times daily as needed for cough. 118 mL 0  . simvastatin (ZOCOR) 20 MG tablet TAKE 1  TABLET BY MOUTH EVERY EVENING BEFORE BEDTIME FOR CHOLESTEROL 30 tablet 4  . tiotropium (SPIRIVA HANDIHALER) 18 MCG inhalation capsule Place 1 capsule (18 mcg total) into inhaler and inhale daily. 30 capsule 3  . traMADol (ULTRAM) 50 MG tablet Take 1 tablet (50 mg total) by mouth every 8 (eight) hours as needed for severe pain. 20 tablet 0  . valACYclovir (VALTREX) 500 MG tablet Take 500 mg by mouth daily.    . cefdinir (OMNICEF) 300 MG capsule Take 1 capsule (300 mg total) by mouth 2 (two) times daily. 14 capsule 0   No facility-administered medications prior to visit.       Past Surgical History:  Procedure Laterality Date  . ABDOMINAL HYSTERECTOMY    . CHOLECYSTECTOMY    . CHONDROPLASTY Left 01/06/2015   Procedure: CHONDROPLASTY;  Surgeon: Marybelle Killings, MD;  Location: [place];  Service: Orthopedics;  Laterality: Left;  . COLONOSCOPY    . ELBOW FRACTURE SURGERY     right  . KNEE ARTHROSCOPY WITH EXCISION PLICA Left 0000000   Procedure: KNEE ARTHROSCOPY WITH EXCISION PLICA;  Surgeon: Marybelle Killings,  MD;  Location: [place];  Service: Orthopedics;  Laterality: Left;  . Lower Extremity Arterial Dopplers  07/06/2013   RABI 1.0, LABI 1.1.; No evidence of significant vascular atherogenic plaque  . NECK SURGERY    . NM MYOVIEW LTD  07/02/2013   Normal LV function, EF 83%. (Likely overestimated due to low volume);; no evidence of ischemia or infarction. Normal wall motion.  . TONSILLECTOMY    . WRIST FRACTURE SURGERY     left, has plate      Past Medical History:  Diagnosis Date  . Acute MI    x3  . Allergy   . Anemia   . Ankle fracture 05/2016  . Anxiety   . Arthritis   . Asthma   . Barrett esophagus 2007  . Chest pain 05-02-2009   echo  EF 55%  . COPD (chronic obstructive pulmonary disease) with chronic bronchitis (HCC)    & Emphysema  . Depression   . Duodenitis without mention of hemorrhage 2007  . Esophageal reflux 2007  . Esophageal  stricture   . Full dentures   . H/O cardiovascular stress test 05-02-2009   low risk scan, neg. for ischemia, no ekg changes  . H/O hiatal hernia   . Hiatal hernia [id number]  . Hyperlipidemia   . Multiple fractures    from falls, fx rt. elbow, fx left wrist, bilateral ankles  . Ulcer ([place])       Review of Systems  Constitutional: Positive for fatigue. Negative for chills, fever and unexpected weight change.  HENT: Positive for congestion and sinus pressure. Negative for ear pain, sinus pain and sore throat.   Respiratory: Positive for cough and shortness of breath. Negative for chest tightness.   Cardiovascular: Negative for chest pain, palpitations and leg swelling.  Neurological: Positive for headaches.      Objective:    BP 118/76 (BP Location: Left Arm, Patient Position: Sitting, Cuff Size: Normal)   Pulse 94   Temp 99.2 F (37.3 C) (Oral)   Resp 18   Ht 5\' 4"  (1.626 m)   Wt 139 lb (63 kg)   SpO2 97%   BMI 23.86 kg/m  Nursing note and vital signs reviewed.  Physical Exam  Constitutional: She is oriented to person, place, and time. She appears well-developed and well-nourished.  HENT:  Right Ear: Hearing, tympanic membrane, external ear and ear canal normal.  Left Ear: Hearing, tympanic membrane, external ear and ear canal normal.  Nose: Right sinus exhibits maxillary sinus tenderness and frontal sinus tenderness. Left sinus exhibits maxillary sinus tenderness and frontal sinus tenderness.  Mouth/Throat: Uvula is midline, oropharynx is clear and moist and mucous membranes are normal.  Neck: Neck supple.  Cardiovascular: Normal rate, regular rhythm, normal heart sounds and intact distal pulses.   Pulmonary/Chest: Effort normal and breath sounds normal.  Neurological: She is alert and oriented to person, place, and time.  Skin: Skin is warm and dry.       Assessment & Plan:   Problem List Items Addressed This Visit      Respiratory   COPD exacerbation ([place]) -  Primary    COPD exacerbation appears to be improve with current medication and no adverse side effects. Hospitalist recommends follow up with pulmonology for PFTs and medication management with referral placed. Follow up if symptoms worsen or do not improve.       Relevant Orders   Ambulatory referral to Pulmonology   Community acquired pneumonia    Community  acquired pneumonia appears to be slowly resolving with some remaining sinus symptoms. Will continue current dosage of Omnicef for 5 days. Continue OTC medications as needed for symptoms relief and supportive care. Follow up if symptoms worsen or do not improve.       Relevant Medications   cefdinir (OMNICEF) 300 MG capsule       I am having Ms. Vivier maintain her nitroGLYCERIN, acetaminophen, fluticasone, EPINEPHrine, montelukast, ondansetron, simvastatin, Fluticasone Furoate, meclizine, gabapentin, dexlansoprazole, cyclobenzaprine, DULERA, valACYclovir, butalbital-acetaminophen-caffeine, benzonatate, LORazepam, nicotine, traMADol, albuterol, tiotropium, predniSONE, promethazine-dextromethorphan, and cefdinir.   Meds ordered this encounter  Medications  . cefdinir (OMNICEF) 300 MG capsule    Sig: Take 1 capsule (300 mg total) by mouth 2 (two) times daily.    Dispense:  10 capsule    Refill:  0    Order Specific Question:   Supervising Provider    Answer:   Pricilla Holm A [id number]     Follow-up: Return if symptoms worsen or fail to improve.  Mauricio Po, FNP

## 2016-11-05 NOTE — Assessment & Plan Note (Signed)
Community acquired pneumonia appears to be slowly resolving with some remaining sinus symptoms. Will continue current dosage of Omnicef for 5 days. Continue OTC medications as needed for symptoms relief and supportive care. Follow up if symptoms worsen or do not improve.

## 2016-11-06 DIAGNOSIS — G43719 Chronic migraine without aura, intractable, without status migrainosus: Secondary | ICD-10-CM | POA: Diagnosis not present

## 2016-11-06 DIAGNOSIS — M791 Myalgia: Secondary | ICD-10-CM | POA: Diagnosis not present

## 2016-11-06 DIAGNOSIS — R51 Headache: Secondary | ICD-10-CM | POA: Diagnosis not present

## 2016-11-06 DIAGNOSIS — M542 Cervicalgia: Secondary | ICD-10-CM | POA: Diagnosis not present

## 2016-11-07 DIAGNOSIS — Z8719 Personal history of other diseases of the digestive system: Secondary | ICD-10-CM | POA: Diagnosis not present

## 2016-11-07 DIAGNOSIS — J189 Pneumonia, unspecified organism: Secondary | ICD-10-CM | POA: Diagnosis not present

## 2016-11-07 DIAGNOSIS — D649 Anemia, unspecified: Secondary | ICD-10-CM | POA: Diagnosis not present

## 2016-11-07 DIAGNOSIS — Z79891 Long term (current) use of opiate analgesic: Secondary | ICD-10-CM | POA: Diagnosis not present

## 2016-11-07 DIAGNOSIS — E785 Hyperlipidemia, unspecified: Secondary | ICD-10-CM | POA: Diagnosis not present

## 2016-11-07 DIAGNOSIS — Z9181 History of falling: Secondary | ICD-10-CM | POA: Diagnosis not present

## 2016-11-07 DIAGNOSIS — J441 Chronic obstructive pulmonary disease with (acute) exacerbation: Secondary | ICD-10-CM | POA: Diagnosis not present

## 2016-11-07 DIAGNOSIS — M545 Low back pain: Secondary | ICD-10-CM | POA: Diagnosis not present

## 2016-11-07 DIAGNOSIS — Z7952 Long term (current) use of systemic steroids: Secondary | ICD-10-CM | POA: Diagnosis not present

## 2016-11-07 DIAGNOSIS — I2510 Atherosclerotic heart disease of native coronary artery without angina pectoris: Secondary | ICD-10-CM | POA: Diagnosis not present

## 2016-11-07 DIAGNOSIS — G8929 Other chronic pain: Secondary | ICD-10-CM | POA: Diagnosis not present

## 2016-11-08 ENCOUNTER — Telehealth: Payer: Self-pay | Admitting: Nurse Practitioner

## 2016-11-08 NOTE — Telephone Encounter (Signed)
Routing to dr [employer], can you please advise in the absence of [place]---I will call them back, thanks

## 2016-11-08 NOTE — Telephone Encounter (Signed)
Requesting nursing orders for patient.  States patients abdomin was very distended yesterday.

## 2016-11-09 NOTE — Telephone Encounter (Signed)
OV if abd issus or go to ER Thx

## 2016-11-11 NOTE — Telephone Encounter (Signed)
Advised patty with [facility] note from dr plotnikov---she repeated back for understanding

## 2016-11-14 DIAGNOSIS — D649 Anemia, unspecified: Secondary | ICD-10-CM | POA: Diagnosis not present

## 2016-11-14 DIAGNOSIS — E785 Hyperlipidemia, unspecified: Secondary | ICD-10-CM | POA: Diagnosis not present

## 2016-11-14 DIAGNOSIS — Z7952 Long term (current) use of systemic steroids: Secondary | ICD-10-CM | POA: Diagnosis not present

## 2016-11-14 DIAGNOSIS — J189 Pneumonia, unspecified organism: Secondary | ICD-10-CM | POA: Diagnosis not present

## 2016-11-14 DIAGNOSIS — Z8719 Personal history of other diseases of the digestive system: Secondary | ICD-10-CM | POA: Diagnosis not present

## 2016-11-14 DIAGNOSIS — I2510 Atherosclerotic heart disease of native coronary artery without angina pectoris: Secondary | ICD-10-CM | POA: Diagnosis not present

## 2016-11-14 DIAGNOSIS — M545 Low back pain: Secondary | ICD-10-CM | POA: Diagnosis not present

## 2016-11-14 DIAGNOSIS — J441 Chronic obstructive pulmonary disease with (acute) exacerbation: Secondary | ICD-10-CM | POA: Diagnosis not present

## 2016-11-14 DIAGNOSIS — Z79891 Long term (current) use of opiate analgesic: Secondary | ICD-10-CM | POA: Diagnosis not present

## 2016-11-14 DIAGNOSIS — G8929 Other chronic pain: Secondary | ICD-10-CM | POA: Diagnosis not present

## 2016-11-14 DIAGNOSIS — Z9181 History of falling: Secondary | ICD-10-CM | POA: Diagnosis not present

## 2016-11-20 ENCOUNTER — Ambulatory Visit (INDEPENDENT_AMBULATORY_CARE_PROVIDER_SITE_OTHER): Payer: Medicare Other | Admitting: *Deleted

## 2016-11-20 ENCOUNTER — Other Ambulatory Visit: Payer: Medicare Other

## 2016-11-20 ENCOUNTER — Ambulatory Visit: Payer: Medicare Other | Admitting: Cardiology

## 2016-11-20 ENCOUNTER — Encounter: Payer: Self-pay | Admitting: Cardiology

## 2016-11-20 ENCOUNTER — Ambulatory Visit (INDEPENDENT_AMBULATORY_CARE_PROVIDER_SITE_OTHER): Payer: Medicare Other | Admitting: Cardiology

## 2016-11-20 VITALS — BP 112/70 | HR 79 | Ht 64.0 in | Wt 143.0 lb

## 2016-11-20 DIAGNOSIS — F17200 Nicotine dependence, unspecified, uncomplicated: Secondary | ICD-10-CM | POA: Diagnosis not present

## 2016-11-20 DIAGNOSIS — E785 Hyperlipidemia, unspecified: Secondary | ICD-10-CM

## 2016-11-20 DIAGNOSIS — E781 Pure hyperglyceridemia: Secondary | ICD-10-CM

## 2016-11-20 DIAGNOSIS — R079 Chest pain, unspecified: Secondary | ICD-10-CM

## 2016-11-20 DIAGNOSIS — J309 Allergic rhinitis, unspecified: Secondary | ICD-10-CM | POA: Diagnosis not present

## 2016-11-20 DIAGNOSIS — M940 Chondrocostal junction syndrome [Tietze]: Secondary | ICD-10-CM

## 2016-11-20 DIAGNOSIS — Z79899 Other long term (current) drug therapy: Secondary | ICD-10-CM

## 2016-11-20 MED ORDER — PREDNISONE 20 MG PO TABS: ORAL_TABLET | ORAL | 0 refills | Status: DC

## 2016-11-20 NOTE — Progress Notes (Signed)
PCP: Wilfred Lacy, NP  Clinic Note: Chief Complaint  Patient presents with  . Follow-up    cjest paim, weight gain, rash    HPI: Lori [name] is a 61 y.o. female with a PMH below who presents today for Reevaluation cardiology. She essentially had a negative cardiac evaluation in the past. She has COPD with a recent bout of being acquired pneumonia back in early February.  Lori [name] was last seen on January 2017. This was follow-up from sharp left chest pain. In January 2016 she was evaluated with Myoview nonischemic. She had some possible syncopal episodes at that time. For chest pain with costochondritis. I used a prednisone taper.  Recent Hospitalizations: February 10-13 2017 - COPD/asthma exacerbation  Studies Reviewed: No new studies  Interval History: He presents today again with recurrent chest pain episodes. She says that she's been having the chest pain ever since in the hospital she describes it as is initially a pain under her left breast potential also noticed a tightness in the center of her chest that comes on all of a sudden. It oftentimes has occurred at rest, and woken her up from sleep. She really can't tell if it actually is exacerbated by activity or not. She's had several episodes since discharge, these symptoms are short-lived lasting seconds to minutes but have occasional last up to 15 minutes. She has a baseline exertional dyspnea but that is not necessarily change. She sleeps on 2 pillows with mild orthopnea type symptoms but that is more correlated with her recent illness. She describes having maybe 2 separate passout spells over the last several months, but cannot really tell me much about them in the main thing is that she is using amnestic about the episode. Not associated with any sensation of rapid irregular heartbeats or palpitations. She only notes occasional intermittent flip-flopping but no real palpitations.   No TIA/amaurosis fugax  symptoms.  No claudication.  ROS: A comprehensive was performed. Review of Systems  HENT: Negative for congestion and nosebleeds.   Respiratory: Positive for cough, shortness of breath and wheezing.   Cardiovascular:       Per history of present illness  Gastrointestinal: Positive for heartburn. Negative for blood in stool and melena.  Genitourinary: Negative for hematuria.  Musculoskeletal: Positive for joint pain. Negative for falls.  Skin: Positive for rash (Bilateral flanks and buttock she is has red maculopapular rashes.).  Psychiatric/Behavioral: The patient is nervous/anxious.   All other systems reviewed and are negative.    Past Medical History:  Diagnosis Date  . Acute MI    x3 - by report only. She has had 3 negative Myoview stress test with no evidence of prior infarct.  . Allergy   . Anemia   . Ankle fracture 05/2016  . Anxiety   . Arthritis   . Asthma   . Barrett esophagus 2007  . Chest pain 05-02-2009   echo  EF 55%  . COPD (chronic obstructive pulmonary disease) with chronic bronchitis (HCC)    & Emphysema  . Depression   . Duodenitis without mention of hemorrhage 2007  . Esophageal reflux 2007  . Esophageal stricture   . Full dentures   . H/O hiatal hernia   . Hiatal hernia [id number]  . Hyperlipidemia   . Multiple fractures    from falls, fx rt. elbow, fx left wrist, bilateral ankles  . Ulcer [facility])     Past Surgical History:  Procedure Laterality Date  . ABDOMINAL HYSTERECTOMY    .  CHOLECYSTECTOMY    . CHONDROPLASTY Left 01/06/2015   Procedure: CHONDROPLASTY;  Surgeon: Marybelle Killings, MD;  Location: [facility];  Service: Orthopedics;  Laterality: Left;  . COLONOSCOPY    . ELBOW FRACTURE SURGERY     right  . KNEE ARTHROSCOPY WITH EXCISION PLICA Left 2/35/5732   Procedure: KNEE ARTHROSCOPY WITH EXCISION PLICA;  Surgeon: Marybelle Killings, MD;  Location: [facility];  Service: Orthopedics;  Laterality: Left;  . Lower  Extremity Arterial Dopplers  07/06/2013   RABI 1.0, LABI 1.1.; No evidence of significant vascular atherogenic plaque  . NECK SURGERY    . NM MYOVIEW LTD  09/2014   LOW RISK. Normal EF of 65% with no regional wall motion abnormalities. No ischemia or infarction.  . TONSILLECTOMY    . WRIST FRACTURE SURGERY     left, has plate    Current Meds  Medication Sig  . acetaminophen (TYLENOL) 500 MG tablet Take 2 tablets (1,000 mg total) by mouth every 6 (six) hours as needed.  Marland Kitchen albuterol (PROVENTIL HFA;VENTOLIN HFA) 108 (90 Base) MCG/ACT inhaler Inhale 2 puffs into the lungs every 6 (six) hours as needed for wheezing or shortness of breath.  . benzonatate (TESSALON) 100 MG capsule Take 2 capsules (200 mg total) by mouth 3 (three) times daily as needed (excessive coughing).  . butalbital-acetaminophen-caffeine (FIORICET, ESGIC) 50-325-40 MG tablet Take 1 tablet by mouth every 6 (six) hours as needed for headache.  . cefdinir (OMNICEF) 300 MG capsule Take 1 capsule (300 mg total) by mouth 2 (two) times daily.  . cyclobenzaprine (FLEXERIL) 5 MG tablet Take 1 tablet (5 mg total) by mouth 3 (three) times daily as needed for muscle spasms.  Marland Kitchen dexlansoprazole (DEXILANT) 60 MG capsule Take 1 capsule (60 mg total) by mouth daily.  . DULERA 200-5 MCG/ACT AERO Inhale 1 puff into the lungs 2 (two) times daily.  Marland Kitchen EPINEPHrine 0.3 mg/0.3 mL IJ SOAJ injection Use as directed for severe allergic reaction  . fluticasone (FLONASE) 50 MCG/ACT nasal spray Place 2 sprays into both nostrils daily.  . Fluticasone Furoate (ARNUITY ELLIPTA) 200 MCG/ACT AEPB Inhale 1 puff into the lungs daily.  Marland Kitchen gabapentin (NEURONTIN) 100 MG capsule Take 1 capsule (100 mg total) by mouth as directed.  Marland Kitchen LORazepam (ATIVAN) 0.5 MG tablet Take 1 tablet (0.5 mg total) by mouth every 8 (eight) hours as needed for anxiety.  . meclizine (ANTIVERT) 25 MG tablet Take 1 tablet (25 mg total) by mouth 3 (three) times daily as needed for dizziness.  Only as needed for DIZZINESS  . montelukast (SINGULAIR) 10 MG tablet Take 1 tablet (10 mg total) by mouth daily.  . nicotine (NICODERM CQ - DOSED IN MG/24 HOURS) 14 mg/24hr patch Place 1 patch (14 mg total) onto the skin daily.  . nitroGLYCERIN (NITROSTAT) 0.4 MG SL tablet Place 1 tablet (0.4 mg total) under the tongue every 5 (five) minutes as needed for chest pain.  Marland Kitchen ondansetron (ZOFRAN) 4 MG tablet Take 1 tablet (4 mg total) by mouth every 8 (eight) hours as needed for nausea or vomiting. One tab 30 mintues before each bowel prep  . predniSONE (DELTASONE) 20 MG tablet Take 3 tablets by mouth daily X 1 day; then 2 tablets by mouth daily X 2 days; then 1 tablet by mouth daily X 3 days; then 1/2 tablet by mouth daily X 3 days and stop prednisone.  . promethazine-dextromethorphan (PROMETHAZINE-DM) 6.25-15 MG/5ML syrup Take 5 mLs by mouth 4 (  four) times daily as needed for cough.  . simvastatin (ZOCOR) 20 MG tablet TAKE 1 TABLET BY MOUTH EVERY EVENING BEFORE BEDTIME FOR CHOLESTEROL  . tiotropium (SPIRIVA HANDIHALER) 18 MCG inhalation capsule Place 1 capsule (18 mcg total) into inhaler and inhale daily.  . traMADol (ULTRAM) 50 MG tablet Take 1 tablet (50 mg total) by mouth every 8 (eight) hours as needed for severe pain.  . valACYclovir (VALTREX) 500 MG tablet Take 500 mg by mouth daily.    Allergies  Allergen Reactions  . Bee Venom Swelling  . Codeine Nausea Only    CAUSES ULCERS  . Ibuprofen Nausea And Vomiting  . Levofloxacin Nausea Only  . Propoxyphene N-Acetaminophen Nausea Only  . Toradol [Ketorolac Tromethamine] Nausea And Vomiting  . Hydrocodone-Acetaminophen Nausea Only    REACTION: "sick"  . Latex Rash    Social History   Social History  . Marital status: Married    Spouse name: [name]  . Number of children: 2  . Years of education: HS   Occupational History  . disable    Social History Main Topics  . Smoking status: Current Every Day Smoker    Packs/day: 1.50     Years: 21.00    Types: Cigarettes  . Smokeless tobacco: Never Used     Comment: tobaccco info given 03/06/12, 1.5 PPD 05/15/15  . Alcohol use No  . Drug use: No  . Sexual activity: No   Other Topics Concern  . None   Social History Narrative   Patient lives at home spouse [name].  Has 2 daughters (60 & 78).   Currently "disabled".   Caffeine Use: tea 4 glasses daily.    Smokes ~1 1/2 PPD  05/15/15   Walks everyday - no set routine.    family history includes Dementia in her mother; Diabetes in her maternal aunt and maternal grandmother; Emphysema in her father; Heart disease in her brother.  Wt Readings from Last 3 Encounters:  11/20/16 64.9 kg (143 lb)  11/05/16 63 kg (139 lb)  11/02/16 62.7 kg (138 lb 1.9 oz)    PHYSICAL EXAM BP 112/70 (BP Location: Left Arm, Patient Position: Sitting, Cuff Size: Normal)   Pulse 79   Ht 5\' 4"  (1.626 m)   Wt 64.9 kg (143 lb)   BMI 24.55 kg/m  General appearance: alert, cooperative, appears stated age, no distress  HEENT: [place]/AT, EOMI, MMM, anicteric sclera; she wears glasses, is edentulous. Neck: no adenopathy, no carotid bruit and no JVD Lungs: Diffuse mild rhonchorous breath sounds with expiratory wheezing, but non-labored, and clear to percussion. Somewhat clears with coughing. Heart: regular rate and rhythm, S1, S2 normal, no murmur, click, rub or gallop nondisplaced Abdomen: soft, non-tender; bowel sounds normal; no masses, no organomegaly; no hepato- jugular reflux Extremities: extremities normal, atraumatic, no cyanosis, and edema none Skin: Bilateral flanks and buttock she is has red maculopapular rashes Pulses:1 + DP/PT pulses;  Neurologic: Mental status: Alert, oriented, thought content appropriate Cranial nerves: normal (II-XII grossly intact)   Adult ECG Report  Rate: 76 ;  Rhythm: normal sinus rhythm and Normal axis, intervals and durations;   Narrative Interpretation: Normal EKG   Other studies Reviewed: Additional  studies/ records that were reviewed today include:  Recent Labs:  Ordered on Am of visit. Lab Results  Component Value Date   CREATININE 0.83 11/20/2016   BUN 6 (L) 11/20/2016   NA 139 11/20/2016   K 4.3 11/20/2016   CL 101 11/20/2016   CO2  23 11/20/2016   Lab Results  Component Value Date   CHOL 201 (H) 11/20/2016   HDL 42 11/20/2016   LDLCALC 104 (H) 11/20/2016   LDLDIRECT 187.0 10/21/2016   TRIG 277 (H) 11/20/2016   CHOLHDL 4.8 (H) 11/20/2016    ASSESSMENT / PLAN: Problem List Items Addressed This Visit    Chest pain with moderate risk for cardiac etiology - Primary    She has 2 different types of chest pain 1 seems clearly musculoskeletal and reproducible exam as before and I think we'll treat that his cost chondritis, however there is another component of tightness and pressure that is different than what she described before and is somewhat more concerning. She has had several evaluations in the past including Myoview stress test which are physiologic in nature. I would like to know was switched years and looked a more anatomic evaluation to determine if she has evidence of CAD.   Plan: Coronary artery CTA Prednisone taper for possible costochondritis (this helped the musculoskeletal type pain last time)      Relevant Orders   EKG 12-Lead (Completed)   CT CORONARY MORPH W/CTA COR W/SCORE W/CA W/CM &/OR WO/CM   Comprehensive metabolic panel (Completed)   Lipid panel (Completed)   Costochondritis    In the past she's had episodes of reproducible chest pain, and summer pain today is clearly that type of pain. The fact that it concurred with her hospitalization and treatment for pneumonia makes it more likely to be musculoskeletal in nature. Other tightness and squeezing sensation that is more concerning for potential angina. Plan: We will do prednisone taper but evaluate type pain with Coronary Artery CTA       Hyperlipidemia LDL goal <100    Her last labs were checked  about a year ago, and she is due now. Since we are concerned about potential cardiac issues I would like to see what her labs we have ordered labs for today and we will actually done at the time of dictation. Heels pretty much at goal, triglycerides are elevated. I am very leery of actually putting her on new medication, she is on plenty of them. She is on simvastatin, we may simply just peeled increase this dose to 40 mg. Can discuss based on results of CT scan      Relevant Orders   Comprehensive metabolic panel (Completed)   Lipid panel (Completed)   Smoking (Chronic)    Smoking cessation instruction/counseling given:  counseled patient on the dangers of tobacco use, advised patient to stop smoking, and reviewed strategies to maximize success       Other Visit Diagnoses    Hypertriglyceridemia       Relevant Orders   Comprehensive metabolic panel (Completed)   Lipid panel (Completed)   Medication management       Relevant Orders   Comprehensive metabolic panel (Completed)   Lipid panel (Completed)      Current medicines are reviewed at length with the patient today. (+/- concerns) n/a The following changes have been made: see below  Patient Instructions  medications  PREDNISONE  20MG  Take 3 tablets for 3 days, then 2 tablets for 3 days ,then 2 tablets for 2 days, then then 1 tablet for 2 days then stop    SCHEDULE  Your physician has requested that you have cardiac CT  ( CORONARY CTA). Cardiac computed tomography (CT) is a painless test that uses an x-ray machine to take clear, detailed pictures of your heart. For  further information please visit [facility]. Please follow instruction sheet as given.  LABS  At [place] THE MORNING OF THE TEST    Your physician recommends that you schedule a follow-up appointment in 2 MONTHS WITH DT Derryck Shahan    Studies Ordered:   Orders Placed This Encounter   Procedures  . CT CORONARY MORPH W/CTA COR W/SCORE W/CA W/CM &/OR WO/CM  . Comprehensive metabolic panel  . Lipid panel  . EKG 12-Lead      Glenetta Hew, M.D., M.S. Interventional Cardiologist   Pager # [phone] Phone # [phone] [address] [place], [place] [address]

## 2016-11-20 NOTE — Patient Instructions (Signed)
medications  PREDNISONE  20MG  Take 3 tablets for 3 days, then 2 tablets for 3 days ,then 2 tablets for 2 days, then then 1 tablet for 2 days then stop    SCHEDULE  Your physician has requested that you have cardiac CT  ( CORONARY CTA). Cardiac computed tomography (CT) is a painless test that uses an x-ray machine to take clear, detailed pictures of your heart. For further information please visit [facility]. Please follow instruction sheet as given.  LABS  At [place] THE MORNING OF THE TEST    Your physician recommends that you schedule a follow-up appointment in 2 MONTHS WITH DT HARDING

## 2016-11-21 ENCOUNTER — Other Ambulatory Visit: Payer: Self-pay | Admitting: Nurse Practitioner

## 2016-11-21 ENCOUNTER — Telehealth: Payer: Self-pay | Admitting: Cardiology

## 2016-11-21 DIAGNOSIS — M542 Cervicalgia: Secondary | ICD-10-CM | POA: Diagnosis not present

## 2016-11-21 DIAGNOSIS — R51 Headache: Secondary | ICD-10-CM | POA: Diagnosis not present

## 2016-11-21 DIAGNOSIS — J3089 Other allergic rhinitis: Secondary | ICD-10-CM | POA: Diagnosis not present

## 2016-11-21 DIAGNOSIS — M791 Myalgia: Secondary | ICD-10-CM | POA: Diagnosis not present

## 2016-11-21 DIAGNOSIS — G43719 Chronic migraine without aura, intractable, without status migrainosus: Secondary | ICD-10-CM | POA: Diagnosis not present

## 2016-11-21 LAB — COMPREHENSIVE METABOLIC PANEL
A/G RATIO: 2.2 (ref 1.2–2.2)
ALT: 12 IU/L (ref 0–32)
AST: 9 IU/L (ref 0–40)
Albumin: 4.1 g/dL (ref 3.6–4.8)
Alkaline Phosphatase: 94 IU/L (ref 39–117)
BUN/Creatinine Ratio: 7 — ABNORMAL LOW (ref 12–28)
BUN: 6 mg/dL — ABNORMAL LOW (ref 8–27)
Bilirubin Total: 0.4 mg/dL (ref 0.0–1.2)
CALCIUM: 9 mg/dL (ref 8.7–10.3)
CO2: 23 mmol/L (ref 18–29)
Chloride: 101 mmol/L (ref 96–106)
Creatinine, Ser: 0.83 mg/dL (ref 0.57–1.00)
GFR, EST AFRICAN AMERICAN: 89 mL/min/{1.73_m2} (ref >59)
GFR, EST NON AFRICAN AMERICAN: 77 mL/min/{1.73_m2} (ref >59)
Globulin, Total: 1.9 g/dL (ref 1.5–4.5)
Glucose: 88 mg/dL (ref 65–99)
POTASSIUM: 4.3 mmol/L (ref 3.5–5.2)
Sodium: 139 mmol/L (ref 134–144)
TOTAL PROTEIN: 6 g/dL (ref 6.0–8.5)

## 2016-11-21 LAB — LIPID PANEL
CHOL/HDL RATIO: 4.8 ratio — AB (ref 0.0–4.4)
Cholesterol, Total: 201 mg/dL — ABNORMAL HIGH (ref 100–199)
HDL: 42 mg/dL (ref >39)
LDL Calculated: 104 mg/dL — ABNORMAL HIGH (ref 0–99)
Triglycerides: 277 mg/dL — ABNORMAL HIGH (ref 0–149)
VLDL Cholesterol Cal: 55 mg/dL — ABNORMAL HIGH (ref 5–40)

## 2016-11-21 NOTE — Telephone Encounter (Signed)
Yesterday's labs not signed yet. I called patient and advised her when they were reviewed by Dr Ellyn Hack we would call with results. She voiced understanding.

## 2016-11-21 NOTE — Telephone Encounter (Signed)
New message   Pt calling to testing results

## 2016-11-22 ENCOUNTER — Encounter: Payer: Self-pay | Admitting: Cardiology

## 2016-11-22 DIAGNOSIS — J3081 Allergic rhinitis due to animal (cat) (dog) hair and dander: Secondary | ICD-10-CM | POA: Diagnosis not present

## 2016-11-22 NOTE — Assessment & Plan Note (Signed)
Smoking cessation instruction/counseling given:  counseled patient on the dangers of tobacco use, advised patient to stop smoking, and reviewed strategies to maximize success 

## 2016-11-22 NOTE — Assessment & Plan Note (Signed)
She has 2 different types of chest pain 1 seems clearly musculoskeletal and reproducible exam as before and I think we'll treat that his cost chondritis, however there is another component of tightness and pressure that is different than what she described before and is somewhat more concerning. She has had several evaluations in the past including Myoview stress test which are physiologic in nature. I would like to know was switched years and looked a more anatomic evaluation to determine if she has evidence of CAD.   Plan: Coronary artery CTA Prednisone taper for possible costochondritis (this helped the musculoskeletal type pain last time)

## 2016-11-22 NOTE — Assessment & Plan Note (Signed)
Her last labs were checked about a year ago, and she is due now. Since we are concerned about potential cardiac issues I would like to see what her labs we have ordered labs for today and we will actually done at the time of dictation. Heels pretty much at goal, triglycerides are elevated. I am very leery of actually putting her on new medication, she is on plenty of them. She is on simvastatin, we may simply just peeled increase this dose to 40 mg. Can discuss based on results of CT scan

## 2016-11-22 NOTE — Assessment & Plan Note (Signed)
In the past she's had episodes of reproducible chest pain, and summer pain today is clearly that type of pain. The fact that it concurred with her hospitalization and treatment for pneumonia makes it more likely to be musculoskeletal in nature. Other tightness and squeezing sensation that is more concerning for potential angina. Plan: We will do prednisone taper but evaluate type pain with Coronary Artery CTA

## 2016-11-22 NOTE — Telephone Encounter (Signed)
Routing to [place], please advise, thanks 

## 2016-11-23 ENCOUNTER — Encounter: Payer: Self-pay | Admitting: Family Medicine

## 2016-11-23 ENCOUNTER — Ambulatory Visit (INDEPENDENT_AMBULATORY_CARE_PROVIDER_SITE_OTHER): Payer: Medicare Other | Admitting: Family Medicine

## 2016-11-23 ENCOUNTER — Ambulatory Visit (HOSPITAL_COMMUNITY)
Admission: RE | Admit: 2016-11-23 | Discharge: 2016-11-23 | Disposition: A | Discharge status: Home / Self Care | Payer: Medicare Other | Source: Ambulatory Visit | Attending: Family Medicine | Admitting: Family Medicine

## 2016-11-23 DIAGNOSIS — R05 Cough: Secondary | ICD-10-CM | POA: Diagnosis not present

## 2016-11-23 DIAGNOSIS — R21 Rash and other nonspecific skin eruption: Secondary | ICD-10-CM | POA: Diagnosis not present

## 2016-11-23 DIAGNOSIS — R059 Cough, unspecified: Secondary | ICD-10-CM | POA: Insufficient documentation

## 2016-11-23 DIAGNOSIS — J189 Pneumonia, unspecified organism: Secondary | ICD-10-CM | POA: Diagnosis not present

## 2016-11-23 MED ORDER — TRIAMCINOLONE ACETONIDE 0.1 % EX OINT: 1.0000 "application " | TOPICAL_OINTMENT | Freq: Two times a day (BID) | CUTANEOUS | 0 refills | Status: DC

## 2016-11-23 MED ORDER — PROMETHAZINE-DM 6.25-15 MG/5ML PO SYRP
5.0000 mL | ORAL_SOLUTION | Freq: Four times a day (QID) | ORAL | 0 refills | Status: DC | PRN
Start: 2016-11-23 — End: 2017-01-01

## 2016-11-23 NOTE — Patient Instructions (Signed)
We will call with your chest x-ray results.  Please use the topical agent for your rash.  I have refilled her cough medicine.  Take care  Dr. Lacinda Axon

## 2016-11-23 NOTE — Progress Notes (Signed)
Pre visit review using our clinic review tool, if applicable. No additional management support is needed unless otherwise documented below in the visit note. 

## 2016-11-23 NOTE — Progress Notes (Signed)
Subjective:  Patient ID: Lori [name], female    DOB: 1956-06-25  Age: 61 y.o. MRN: [id number]  CC: Cough, rash  HPI:  61 year old female with a history of CAD, COPD/asthma presents with the above complaints.  Patient reports that she's had continued cough for the past 2 weeks. She was recently hospitalized for COPD exacerbation and community-acquired pneumonia. Patient states that her cough is nonproductive. Moderate in severity. She is requesting a refill on cough medication. Follow-up chest x-ray was recommended and has not yet been done. No reports of fever or chills.  Additionally, patient reports a two-week history of a rash around her hips/growing/buttocks. Rash is itchy and burns. No new exposures. No recent new medications. It is localized to the affected area and has not spread. No known exacerbating or relieving factors. No other associated symptoms. No other complaints or concerns at this time.  Social Hx   Social History   Social History  . Marital status: Married    Spouse name: [name]  . Number of children: 2  . Years of education: HS   Occupational History  . disable    Social History Main Topics  . Smoking status: Current Every Day Smoker    Packs/day: 1.50    Years: 21.00    Types: Cigarettes  . Smokeless tobacco: Never Used     Comment: tobaccco info given 03/06/12, 1.5 PPD 05/15/15  . Alcohol use No  . Drug use: No  . Sexual activity: No   Other Topics Concern  . None   Social History Narrative   Patient lives at home spouse [name].  Has 2 daughters (12 & 25).   Currently "disabled".   Caffeine Use: tea 4 glasses daily.    Smokes ~1 1/2 PPD  05/15/15   Walks everyday - no set routine.   Review of Systems  Constitutional: Negative for fever.  Respiratory: Positive for cough.   Skin: Positive for rash.   Objective:  BP 124/72   Pulse 87   Temp 98.1 F (36.7 C) (Oral)   Wt 142 lb 1.9 oz (64.5 kg)   SpO2 97%   BMI 24.39 kg/m   BP/Weight  11/23/2016 11/20/2016 1/60/1093  Systolic BP 235 573 220  Diastolic BP 72 70 76  Wt. (Lbs) 142.12 143 139  BMI 24.39 24.55 23.86    Physical Exam  Constitutional: She appears well-developed. No distress.  HENT:  Mouth/Throat: Oropharynx is clear and moist.  TMs without erythema.  Cardiovascular: Normal rate and regular rhythm.   Pulmonary/Chest: Effort normal. She has no wheezes. She has no rales.  Neurological: She is alert.  Skin:  Papular, erythematous dry rash noted on the right hip and inguinal region as well as in the left inguinal region.  Vitals reviewed.   Lab Results  Component Value Date   WBC 10.9 (H) 10/27/2016   HGB 10.9 (L) 10/27/2016   HCT 32.4 (L) 10/27/2016   PLT 268 10/27/2016   GLUCOSE 88 11/20/2016   CHOL 201 (H) 11/20/2016   TRIG 277 (H) 11/20/2016   HDL 42 11/20/2016   LDLDIRECT 187.0 10/21/2016   LDLCALC 104 (H) 11/20/2016   ALT 12 11/20/2016   AST 9 11/20/2016   NA 139 11/20/2016   K 4.3 11/20/2016   CL 101 11/20/2016   CREATININE 0.83 11/20/2016   BUN 6 (L) 11/20/2016   CO2 23 11/20/2016   TSH 1.74 06/21/2016   INR 0.9 08/31/2008   HGBA1C 5.5 06/21/2016  Assessment & Plan:   Problem List Items Addressed This Visit    Rash    New acute problem. Trial of Triamcinolone.      Cough    New acute problem. Recent CAP and COPD exacerbation. Clinically does not appear to have a COPD exacerbation at this time. Follow-up chest x-ray was recommended and we will proceed with this today. I'm refilling her medication for cough. No indication for antibiotics or prednisone at this time.      Relevant Orders   DG Chest 2 View      Meds ordered this encounter  Medications  . promethazine-dextromethorphan (PROMETHAZINE-DM) 6.25-15 MG/5ML syrup    Sig: Take 5 mLs by mouth 4 (four) times daily as needed for cough.    Dispense:  118 mL    Refill:  0  . triamcinolone ointment (KENALOG) 0.1 %    Sig: Apply 1 application topically 2 (two) times  daily.    Dispense:  30 g    Refill:  0    Follow-up: PRN  [place]

## 2016-11-23 NOTE — Assessment & Plan Note (Signed)
New acute problem. Trial of Triamcinolone.

## 2016-11-23 NOTE — Assessment & Plan Note (Signed)
New acute problem. Recent CAP and COPD exacerbation. Clinically does not appear to have a COPD exacerbation at this time. Follow-up chest x-ray was recommended and we will proceed with this today. I'm refilling her medication for cough. No indication for antibiotics or prednisone at this time.

## 2016-11-26 ENCOUNTER — Encounter: Payer: Self-pay | Admitting: Cardiology

## 2016-11-26 ENCOUNTER — Telehealth: Payer: Self-pay | Admitting: Nurse Practitioner

## 2016-11-26 DIAGNOSIS — Z79891 Long term (current) use of opiate analgesic: Secondary | ICD-10-CM | POA: Diagnosis not present

## 2016-11-26 DIAGNOSIS — Z7952 Long term (current) use of systemic steroids: Secondary | ICD-10-CM | POA: Diagnosis not present

## 2016-11-26 DIAGNOSIS — J441 Chronic obstructive pulmonary disease with (acute) exacerbation: Secondary | ICD-10-CM | POA: Diagnosis not present

## 2016-11-26 DIAGNOSIS — E785 Hyperlipidemia, unspecified: Secondary | ICD-10-CM | POA: Diagnosis not present

## 2016-11-26 DIAGNOSIS — G8929 Other chronic pain: Secondary | ICD-10-CM | POA: Diagnosis not present

## 2016-11-26 DIAGNOSIS — M545 Low back pain: Secondary | ICD-10-CM | POA: Diagnosis not present

## 2016-11-26 DIAGNOSIS — D649 Anemia, unspecified: Secondary | ICD-10-CM | POA: Diagnosis not present

## 2016-11-26 DIAGNOSIS — J189 Pneumonia, unspecified organism: Secondary | ICD-10-CM | POA: Diagnosis not present

## 2016-11-26 DIAGNOSIS — Z8719 Personal history of other diseases of the digestive system: Secondary | ICD-10-CM | POA: Diagnosis not present

## 2016-11-26 DIAGNOSIS — I2510 Atherosclerotic heart disease of native coronary artery without angina pectoris: Secondary | ICD-10-CM | POA: Diagnosis not present

## 2016-11-26 DIAGNOSIS — Z9181 History of falling: Secondary | ICD-10-CM | POA: Diagnosis not present

## 2016-11-26 NOTE — Telephone Encounter (Signed)
Pt met her goal.

## 2016-11-26 NOTE — Telephone Encounter (Signed)
Pt was discharged from [facility] , [facility] called to report

## 2016-11-26 NOTE — Telephone Encounter (Signed)
Was she discharged because she has met her goals?

## 2016-11-28 NOTE — Progress Notes (Signed)
Chemistry panel looks pretty good with normal kidney function and liver function. Okay for CT scan. Cholesterol level continues to be poorly controlled. Triglycerides are still high, but not as bad as they were last month.  With her cardiac Risk Factors - I think that we need to consider starting a statin: lets try rosuvastatin 20 mg (if not - atorvastatin 20 mg) - recheck labs in ~4 months after starting statin.  Glenetta Hew, MD

## 2016-11-29 ENCOUNTER — Telehealth: Payer: Self-pay | Admitting: *Deleted

## 2016-11-29 DIAGNOSIS — Z79899 Other long term (current) drug therapy: Secondary | ICD-10-CM | POA: Insufficient documentation

## 2016-11-29 DIAGNOSIS — Z1231 Encounter for screening mammogram for malignant neoplasm of breast: Secondary | ICD-10-CM | POA: Diagnosis not present

## 2016-11-29 DIAGNOSIS — E785 Hyperlipidemia, unspecified: Secondary | ICD-10-CM

## 2016-11-29 DIAGNOSIS — R1030 Lower abdominal pain, unspecified: Secondary | ICD-10-CM | POA: Diagnosis not present

## 2016-11-29 MED ORDER — ROSUVASTATIN CALCIUM 20 MG PO TABS: 20.0000 mg | ORAL_TABLET | Freq: Every day | ORAL | 3 refills | Status: DC

## 2016-11-29 NOTE — Telephone Encounter (Signed)
-----   Message from Leonie Man, MD sent at 11/28/2016  1:55 PM EDT ----- Chemistry panel looks pretty good with normal kidney function and liver function. Okay for CT scan. Cholesterol level continues to be poorly controlled. Triglycerides are still high, but not as bad as they were last month.  With her cardiac Risk Factors - I think that we need to consider starting a statin: lets try rosuvastatin 20 mg (if not - atorvastatin 20 mg) - recheck labs in ~4 months after starting statin.  Glenetta Hew, MD

## 2016-11-29 NOTE — Telephone Encounter (Signed)
Spoke to patient. Result given . Verbalized understanding E-SENT 90 DAY SUPPLY. WILL MAIL LAB SLIP @4  MONTHS FOR RECHECK

## 2016-11-29 NOTE — Telephone Encounter (Signed)
LEFT MESSAGE TO CALL BACK  IN REGARDS TO LAB RESULTS 

## 2016-12-02 ENCOUNTER — Ambulatory Visit (HOSPITAL_COMMUNITY)
Admission: RE | Admit: 2016-12-02 | Discharge: 2016-12-02 | Disposition: A | Discharge status: Home / Self Care | Payer: Medicare Other | Source: Ambulatory Visit | Attending: Cardiology | Admitting: Cardiology

## 2016-12-02 DIAGNOSIS — R079 Chest pain, unspecified: Secondary | ICD-10-CM | POA: Diagnosis not present

## 2016-12-02 DIAGNOSIS — J432 Centrilobular emphysema: Secondary | ICD-10-CM | POA: Diagnosis not present

## 2016-12-02 DIAGNOSIS — I700 Atherosclerosis of aorta: Secondary | ICD-10-CM | POA: Insufficient documentation

## 2016-12-02 HISTORY — PX: CT CTA CORONARY W/CA SCORE W/CM &/OR WO/CM: HXRAD787

## 2016-12-02 MED ORDER — NITROGLYCERIN 0.4 MG SL SUBL
SUBLINGUAL_TABLET | SUBLINGUAL | Status: AC
  Filled 2016-12-02: qty 1

## 2016-12-02 MED ORDER — METOPROLOL TARTRATE 5 MG/5ML IV SOLN
INTRAVENOUS | Status: AC
  Administered 2016-12-02: 5 mg via INTRAVENOUS
  Filled 2016-12-02: qty 20

## 2016-12-02 MED ORDER — NITROGLYCERIN 0.4 MG SL SUBL
0.8000 mg | SUBLINGUAL_TABLET | SUBLINGUAL | Status: DC | PRN
  Administered 2016-12-02: 0.8 mg via SUBLINGUAL

## 2016-12-02 MED ORDER — METOPROLOL TARTRATE 5 MG/5ML IV SOLN
5.0000 mg | INTRAVENOUS | Status: DC | PRN
  Administered 2016-12-02 (×3): 5 mg via INTRAVENOUS
  Filled 2016-12-02: qty 5

## 2016-12-02 MED ORDER — IOPAMIDOL (ISOVUE-370) INJECTION 76%
INTRAVENOUS | Status: AC
  Administered 2016-12-02: 80 mL via INTRAVENOUS
  Filled 2016-12-02: qty 100

## 2016-12-03 ENCOUNTER — Ambulatory Visit (INDEPENDENT_AMBULATORY_CARE_PROVIDER_SITE_OTHER): Payer: Medicare Other | Admitting: Pulmonary Disease

## 2016-12-03 ENCOUNTER — Encounter: Payer: Self-pay | Admitting: Pulmonary Disease

## 2016-12-03 ENCOUNTER — Other Ambulatory Visit: Payer: Medicare Other

## 2016-12-03 ENCOUNTER — Telehealth: Payer: Self-pay | Admitting: Pulmonary Disease

## 2016-12-03 VITALS — BP 98/72 | HR 117 | Ht 64.0 in | Wt 139.8 lb

## 2016-12-03 DIAGNOSIS — K219 Gastro-esophageal reflux disease without esophagitis: Secondary | ICD-10-CM

## 2016-12-03 DIAGNOSIS — J302 Other seasonal allergic rhinitis: Secondary | ICD-10-CM | POA: Diagnosis not present

## 2016-12-03 DIAGNOSIS — F17210 Nicotine dependence, cigarettes, uncomplicated: Secondary | ICD-10-CM | POA: Diagnosis not present

## 2016-12-03 DIAGNOSIS — Z23 Encounter for immunization: Secondary | ICD-10-CM | POA: Diagnosis not present

## 2016-12-03 DIAGNOSIS — Z720 Tobacco use: Secondary | ICD-10-CM | POA: Insufficient documentation

## 2016-12-03 DIAGNOSIS — J3089 Other allergic rhinitis: Secondary | ICD-10-CM | POA: Insufficient documentation

## 2016-12-03 DIAGNOSIS — J439 Emphysema, unspecified: Secondary | ICD-10-CM

## 2016-12-03 DIAGNOSIS — K2100 Gastro-esophageal reflux disease with esophagitis, without bleeding: Secondary | ICD-10-CM

## 2016-12-03 DIAGNOSIS — F17200 Nicotine dependence, unspecified, uncomplicated: Secondary | ICD-10-CM | POA: Insufficient documentation

## 2016-12-03 DIAGNOSIS — J45909 Unspecified asthma, uncomplicated: Secondary | ICD-10-CM

## 2016-12-03 MED ORDER — MONTELUKAST SODIUM 10 MG PO TABS: 10.0000 mg | ORAL_TABLET | Freq: Every day | ORAL | 6 refills | Status: DC

## 2016-12-03 MED ORDER — DEXLANSOPRAZOLE 60 MG PO CPDR: 60.0000 mg | DELAYED_RELEASE_CAPSULE | Freq: Every day | ORAL | 6 refills | Status: DC

## 2016-12-03 MED ORDER — RANITIDINE HCL 150 MG PO TABS: 150.0000 mg | ORAL_TABLET | Freq: Every day | ORAL | 3 refills | Status: DC

## 2016-12-03 NOTE — Addendum Note (Signed)
Addended by: Tyson Dense on: 12/03/2016 05:05 PM   Modules accepted: Orders

## 2016-12-03 NOTE — Progress Notes (Signed)
Subjective:    Patient ID: Lori [name], female    DOB: 1955/11/03, 61 y.o.   MRN: [id number]  HPI Patient was hospitalized for an exacerbation of his reported underlying COPD/asthma in February. He was noted to be using cigarettes at the time of admission. She reports she did have "tightness" in her chest yesterday after her chest CT coronary angiography that was relieved with a sublingual nitroglycerin and ASA 81mg . She reports she has intermittent "tightness" without a clear association with exertion as it can also occur at rest. No other chest pain or pressure. The pain resolves after 1-2 hours. She does report some associated anxiety. She does have intermittent reflux as well as morning brash water taste. She reports dyspnea primarily with exertion. She does report a 2 pillow orthopnea. She reports she does wake up at night occasionally with dyspnea. She reports intermittent coughing at baseline but reports it is nonproductive. She does wheeze intermittently as well. She reports she has had breathing problems since she was a child and was previously diagnosed with asthma. She reports yearly bronchitis. No history of pneumonia before her recent admission. She does reports seasonal sinus congestion, pressure, & drainage. She has been on Arnuity for a month without significant help in her breathing. She reports no significant improvement in her breathing with her rescue inhaler either. No prior relief of symptoms on Dulera or Spiriva. She reports she is out of her Dexilant and Singulair with help of her symptoms. However, she still has reflux even when she is on Dexilant. On further questioning of the patient she subsequently denied taking any inhalers other than her albuterol rescue inhaler.  Review of Systems No bruising or rashes. She does have frequent headaches. She does have a history of prior syncope. A pertinent 14 point review of systems is negative except as per the history of presenting  illness.  Allergies  Allergen Reactions  . Bee Venom Swelling  . Codeine Nausea Only    CAUSES ULCERS  . Ibuprofen Nausea And Vomiting  . Levofloxacin Nausea Only  . Propoxyphene N-Acetaminophen Nausea Only  . Toradol [Ketorolac Tromethamine] Nausea And Vomiting  . Hydrocodone-Acetaminophen Nausea Only    REACTION: "sick"  . Latex Rash    Current Outpatient Prescriptions on File Prior to Visit  Medication Sig Dispense Refill  . albuterol (PROVENTIL HFA;VENTOLIN HFA) 108 (90 Base) MCG/ACT inhaler Inhale 2 puffs into the lungs every 6 (six) hours as needed for wheezing or shortness of breath. 1 Inhaler 3  . benzonatate (TESSALON) 100 MG capsule Take 2 capsules (200 mg total) by mouth 3 (three) times daily as needed (excessive coughing). 25 capsule 0  . butalbital-acetaminophen-caffeine (FIORICET, ESGIC) 50-325-40 MG tablet Take 1 tablet by mouth every 6 (six) hours as needed for headache.    . cyclobenzaprine (FLEXERIL) 5 MG tablet Take 1 tablet (5 mg total) by mouth 3 (three) times daily as needed for muscle spasms. 30 tablet 0  . DULERA 200-5 MCG/ACT AERO Inhale 1 puff into the lungs 2 (two) times daily.    Marland Kitchen EPINEPHrine 0.3 mg/0.3 mL IJ SOAJ injection Use as directed for severe allergic reaction 2 Device 1  . fluticasone (FLONASE) 50 MCG/ACT nasal spray Place 2 sprays into both nostrils daily. 16 g 0  . Fluticasone Furoate (ARNUITY ELLIPTA) 200 MCG/ACT AEPB Inhale 1 puff into the lungs daily. 30 each 5  . gabapentin (NEURONTIN) 100 MG capsule Take 1 capsule (100 mg total) by mouth as directed. 90 capsule 3  .  LORazepam (ATIVAN) 0.5 MG tablet Take 1 tablet (0.5 mg total) by mouth every 8 (eight) hours as needed for anxiety. 20 tablet 0  . meclizine (ANTIVERT) 25 MG tablet Take 1 tablet (25 mg total) by mouth 3 (three) times daily as needed for dizziness. Only as needed for DIZZINESS 21 tablet 0  . montelukast (SINGULAIR) 10 MG tablet Take 1 tablet (10 mg total) by mouth daily. 30 tablet  5  . nitroGLYCERIN (NITROSTAT) 0.4 MG SL tablet Place 1 tablet (0.4 mg total) under the tongue every 5 (five) minutes as needed for chest pain. 25 tablet 3  . promethazine-dextromethorphan (PROMETHAZINE-DM) 6.25-15 MG/5ML syrup Take 5 mLs by mouth 4 (four) times daily as needed for cough. 118 mL 0  . rosuvastatin (CRESTOR) 20 MG tablet Take 1 tablet (20 mg total) by mouth daily. 90 tablet 3  . simvastatin (ZOCOR) 20 MG tablet TAKE 1 TABLET BY MOUTH EVERY EVENING BEFORE BEDTIME FOR CHOLESTEROL 30 tablet 4  . tiotropium (SPIRIVA HANDIHALER) 18 MCG inhalation capsule Place 1 capsule (18 mcg total) into inhaler and inhale daily. 30 capsule 3  . valACYclovir (VALTREX) 500 MG tablet Take 500 mg by mouth daily.    Marland Kitchen acetaminophen (TYLENOL) 500 MG tablet Take 2 tablets (1,000 mg total) by mouth every 6 (six) hours as needed. (Patient not taking: Reported on 12/03/2016) 30 tablet 0  . cyclobenzaprine (FLEXERIL) 5 MG tablet Take 1 tablet (5 mg total) by mouth 3 (three) times daily as needed for muscle spasms. (Patient not taking: Reported on 12/03/2016) 30 tablet 0  . dexlansoprazole (DEXILANT) 60 MG capsule Take 1 capsule (60 mg total) by mouth daily. (Patient not taking: Reported on 12/03/2016) 30 capsule 3  . nicotine (NICODERM CQ - DOSED IN MG/24 HOURS) 14 mg/24hr patch Place 1 patch (14 mg total) onto the skin daily. (Patient not taking: Reported on 12/03/2016) 28 patch 0  . ondansetron (ZOFRAN) 4 MG tablet Take 1 tablet (4 mg total) by mouth every 8 (eight) hours as needed for nausea or vomiting. One tab 30 mintues before each bowel prep (Patient not taking: Reported on 12/03/2016) 20 tablet 0  . predniSONE (DELTASONE) 20 MG tablet Take 3 tablets by mouth daily X 1 day; then 2 tablets by mouth daily X 2 days; then 1 tablet by mouth daily X 3 days; then 1/2 tablet by mouth daily X 3 days and stop prednisone. (Patient not taking: Reported on 12/03/2016) 12 tablet 0  . predniSONE (DELTASONE) 20 MG tablet Take 3  tablets for 3 days, then 2 tablets for 3 days ,then 2 tablets for 2 days, then then 1 tablet for 2 days then stop (Patient not taking: Reported on 12/03/2016) 21 tablet 0  . traMADol (ULTRAM) 50 MG tablet Take 1 tablet (50 mg total) by mouth every 8 (eight) hours as needed for severe pain. (Patient not taking: Reported on 12/03/2016) 20 tablet 0  . triamcinolone ointment (KENALOG) 0.1 % Apply 1 application topically 2 (two) times daily. (Patient not taking: Reported on 12/03/2016) 30 g 0   No current facility-administered medications on file prior to visit.     Past Medical History:  Diagnosis Date  . Acute MI    x3 - by report only. She has had 3 negative Myoview stress test with no evidence of prior infarct.  . Allergic rhinitis   . Allergy   . Anemia   . Ankle fracture 05/2016  . Anxiety   . Arthritis   . Asthma   .  Barrett esophagus 2007  . Chest pain 05-02-2009   echo  EF 55%  . COPD (chronic obstructive pulmonary disease) with chronic bronchitis (HCC)    & Emphysema  . Depression   . Duodenitis without mention of hemorrhage 2007  . Esophageal reflux 2007  . Esophageal stricture   . Full dentures   . H/O hiatal hernia   . Hiatal hernia [id number]  . Hyperlipidemia   . Multiple fractures    from falls, fx rt. elbow, fx left wrist, bilateral ankles  . Ulcer [facility])     Past Surgical History:  Procedure Laterality Date  . ABDOMINAL HYSTERECTOMY    . CHOLECYSTECTOMY    . CHONDROPLASTY Left 01/06/2015   Procedure: CHONDROPLASTY;  Surgeon: Marybelle Killings, MD;  Location: [place];  Service: Orthopedics;  Laterality: Left;  . COLONOSCOPY    . ELBOW FRACTURE SURGERY     right  . KNEE ARTHROSCOPY WITH EXCISION PLICA Left 03/12/349   Procedure: KNEE ARTHROSCOPY WITH EXCISION PLICA;  Surgeon: Marybelle Killings, MD;  Location: [place];  Service: Orthopedics;  Laterality: Left;  . Lower Extremity Arterial Dopplers  07/06/2013   RABI 1.0, LABI 1.1.; No  evidence of significant vascular atherogenic plaque  . NECK SURGERY    . NM MYOVIEW LTD  09/2014   LOW RISK. Normal EF of 65% with no regional wall motion abnormalities. No ischemia or infarction.  . TONSILLECTOMY    . WRIST FRACTURE SURGERY     left, has plate    Family History  Problem Relation Age of Onset  . Emphysema Father   . Dementia Mother   . Diabetes Maternal Grandmother   . Diabetes Maternal Aunt   . Diabetes      mat. cousin  . Heart disease Brother   . Colon cancer Neg Hx   . Allergic rhinitis Neg Hx   . Angioedema Neg Hx   . Asthma Neg Hx   . Atopy Neg Hx   . Eczema Neg Hx   . Immunodeficiency Neg Hx   . Urticaria Neg Hx     Social History   Social History  . Marital status: Married    Spouse name: [name]  . Number of children: 2  . Years of education: HS   Occupational History  . disable    Social History Main Topics  . Smoking status: Current Every Day Smoker    Packs/day: 1.00    Years: 39.00    Types: Cigarettes    Start date: 08/06/1977  . Smokeless tobacco: Never Used     Comment: Currently smoking 0.5ppd.  Quit once for 4 months.   . Alcohol use No  . Drug use: No  . Sexual activity: No   Other Topics Concern  . None   Social History Narrative   Patient lives at home spouse [name].  Has 2 daughters (37 & 57).   Currently "disabled".   Caffeine Use: tea 4 glasses daily.    Smokes ~1 1/2 PPD  05/15/15   Walks everyday - no set routine.       Pulmonary (12/03/16):   Originally from [facility]. 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 Currently has a dog. No bird exposure. No known mold exposure.       Objective:   Physical Exam BP 98/72 (BP Location: Right Arm, Patient Position: Sitting, Cuff Size: Normal)   Pulse (!) 117   Ht 5\' 4"  (1.626 m)   Wt  139 lb 12.8 oz (63.4 kg)   SpO2 97%   BMI 24.00 kg/m  General:  Awake. Alert. No acute distress. Integument:  Warm & dry. No rash on exposed skin. No bruising  on exposed skin. Extremities:  No cyanosis or clubbing.  Lymphatics:  No appreciated cervical or supraclavicular lymphadenoapthy. HEENT:  Moist mucus membranes. No oral ulcers. No scleral injection or icterus. Minimal bilateral nasal turbinate swelling. Cardiovascular:  Regular rate. No edema. No appreciable JVD.  Pulmonary:  Good aeration & clear to auscultation bilaterally. Symmetric chest wall expansion. No accessory muscle use. Abdomen: Soft. Normal bowel sounds. Nondistended. Grossly nontender. Musculoskeletal:  Normal bulk and tone. Hand grip strength 5/5 bilaterally. No joint deformity or effusion appreciated. Neurological:  CN 2-12 grossly in tact. No meningismus. Moving all 4 extremities equally. Symmetric brachioradialis deep tendon reflexes. Psychiatric:  Mood and affect congruent. Speech normal rhythm, rate & tone.   PFT 09/26/16: FVC 2.37 L (86%) FEV1 1.73 L (77%) FEV1/FVC 0.72 FEF 25-75 1.27 L (54%) 06/19/16: FVC 1.77 L (61%) FEV1 1.26 L (54%) FEV1/FVC 0.71 FEF 25-75 1.01 L (41%) negative bronchodilator response  IMAGING CT CORONARY ANGIOGRAPHY 12/02/16 (personally reviewed by me): Per the radiologist patient's coronary calcium score is 0. Right dominant with normal coronary origin. No evidence of coronary artery disease. Reviewing the lung parenchyma shows some mild dependent atelectasis but no evidence of pleural thickening or pleural effusion on limited views. No pathologically enlarged mediastinal lymph nodes on limited views either. Apical predominant emphysematous changes are present.  CXR PA/LAT 11/23/16 (personally reviewed by me): No parenchymal mass or opacity appreciated. No pleural effusion. Heart normal in size & mediastinum normal in contour.  LABS 09/26/16 IgG: 524 IgA: 88 IgM: 72    Assessment & Plan:  61 y.o. female with no evidence of airway obstruction based on my review of his spirometry as reported above. However, the patient does have apical predominant  emphysematous changes on my review of his CT coronary angiography performed yesterday. Patient has a long-standing history of asthma as well as multiple environmental allergens for which she is undergoing immunotherapy. Reviewing her previous history I do question whether or not there is a significant contributing component of anxiety as well as reflux to the patient's intermittent dyspnea and chest discomfort. I did spend a significant amount time today discussing her inhaler regimen and eventually discovered that she is only taking albuterol as she did not feel that Spiriva or Dulera helped significantly. It's unclear to me what insight the patient has into her disease process. I instructed the patient contact my office if she had any new breathing problems before her next appointment or questions.  1. Pulmonary emphysema/asthma: Screening for alpha-1 antitrypsin deficiency. Checking full pulmonary function testing as well as 6 minute walk test on room air before next appointment. Continuing albuterol inhaler as needed. 2. Tobacco use disorder: Spent over 3 minutes counseling the patient on the need for complete tobacco cessation to prevent worsening of lung function. Recommended nicotine patches with gum for intermittent cravings. 3. GERD: Refilled patient's Dexilant prescription today. Patient counseled on appropriate dietary and lifestyle modifications. Starting Zantac 150 mg by mouth daily at bedtime as well. 4. Chronic seasonal allergic rhinitis: Follows with allergy & immunology. Currently undergoing immunotherapy. Refilled patient's Singulair today. 5. Health maintenance: Status post Pneumovax May 2015 & Tdap August 2017. 6. Follow-up: Return in 6 weeks or sooner if needed.  Sonia Baller Ashok Cordia, M.D. [facility] Pager:  [phone] After 3pm  or if no response, call [phone] 4:33 PM 12/03/16

## 2016-12-03 NOTE — Patient Instructions (Addendum)
   Call me if you have any new breathing problems before your next appointment.  Hold off on taking any inhaler other than your Albuterol inhaler as needed before I see you back.  Take your Dexilant and Singulair as prescribed which we are refilling today.  I'm sending in a prescription for Zantac which you will take at night to help your heartburn/reflux.   Remember to avoid eating or drinking within 2 hours of bedtime, avoid chocolate, avoid caffeine, avoid citrus fruits, avoid spicy foods, and avoid tomatoes to help with your reflux.  Try using a 14mg  nicotine patch daily and then nicotine gum for your intermittent cravings.  Call me if you have any questions.  TESTS ORDERED: 1. Full PFTs before next appointment 2. 6MWT on room air before next appointment 3. Serum Alpha-1 Antitrypsin Phenotype today

## 2016-12-03 NOTE — Telephone Encounter (Signed)
PFT 09/26/16: FVC 2.37 L (86%) FEV1 1.73 L (77%) FEV1/FVC 0.72 FEF 25-75 1.27 L (54%) 06/19/16: FVC 1.77 L (61%) FEV1 1.26 L (54%) FEV1/FVC 0.71 FEF 25-75 1.01 L (41%) negative bronchodilator response  IMAGING CT CORONARY ANGIOGRAPHY 12/02/16 (personally reviewed by me): Per the radiologist patient's coronary calcium score is 0. Right dominant with normal coronary origin. No evidence of coronary artery disease. Reviewing the lung parenchyma shows some mild dependent atelectasis but no evidence of pleural thickening or pleural effusion on limited views. No pathologically enlarged mediastinal lymph nodes on limited views either. Apical predominant emphysematous changes are present.  CXR PA/LAT 11/23/16 (personally reviewed by me): No parenchymal mass or opacity appreciated. No pleural effusion. Heart normal in size & mediastinum normal in contour.  LABS 09/26/16 IgG: 524 IgA: 88 IgM: 72

## 2016-12-04 DIAGNOSIS — G43719 Chronic migraine without aura, intractable, without status migrainosus: Secondary | ICD-10-CM | POA: Diagnosis not present

## 2016-12-04 DIAGNOSIS — M542 Cervicalgia: Secondary | ICD-10-CM | POA: Diagnosis not present

## 2016-12-04 DIAGNOSIS — M791 Myalgia: Secondary | ICD-10-CM | POA: Diagnosis not present

## 2016-12-04 DIAGNOSIS — R51 Headache: Secondary | ICD-10-CM | POA: Diagnosis not present

## 2016-12-06 ENCOUNTER — Telehealth: Payer: Self-pay | Admitting: *Deleted

## 2016-12-06 NOTE — Telephone Encounter (Signed)
Follow up     Patient is returning Sharon's Call for test results

## 2016-12-06 NOTE — Telephone Encounter (Signed)
-----   Message from Leonie Man, MD sent at 12/05/2016  5:35 PM EDT ----- Doristine Devoid news. Overall the coronary calcium score was 0 which means that she has very low risk of having significant coronary disease. There is no evidence of narrowings in any of her coronary arteries. As such, very low likelihood that chest pain is anginal in nature. Would look for noncardiac origin.  Glenetta Hew, MD

## 2016-12-06 NOTE — Telephone Encounter (Signed)
Left message to call back- in regards to ct results

## 2016-12-07 LAB — ALPHA-1 ANTITRYPSIN PHENOTYPE: A1 ANTITRYPSIN: 173 mg/dL (ref 83–199)

## 2016-12-09 NOTE — Telephone Encounter (Signed)
Spoke to patient.  CARDIAC CT Result given . Verbalized understanding

## 2016-12-10 ENCOUNTER — Other Ambulatory Visit: Payer: Self-pay | Admitting: Cardiology

## 2016-12-10 NOTE — Telephone Encounter (Signed)
Rx(s) sent to pharmacy electronically.  

## 2016-12-11 DIAGNOSIS — M545 Low back pain: Secondary | ICD-10-CM | POA: Diagnosis not present

## 2016-12-11 DIAGNOSIS — I2510 Atherosclerotic heart disease of native coronary artery without angina pectoris: Secondary | ICD-10-CM | POA: Diagnosis not present

## 2016-12-11 DIAGNOSIS — J189 Pneumonia, unspecified organism: Secondary | ICD-10-CM | POA: Diagnosis not present

## 2016-12-11 DIAGNOSIS — Z8719 Personal history of other diseases of the digestive system: Secondary | ICD-10-CM | POA: Diagnosis not present

## 2016-12-11 DIAGNOSIS — J441 Chronic obstructive pulmonary disease with (acute) exacerbation: Secondary | ICD-10-CM | POA: Diagnosis not present

## 2016-12-11 DIAGNOSIS — F419 Anxiety disorder, unspecified: Secondary | ICD-10-CM | POA: Diagnosis not present

## 2016-12-11 DIAGNOSIS — F17210 Nicotine dependence, cigarettes, uncomplicated: Secondary | ICD-10-CM | POA: Diagnosis not present

## 2016-12-11 DIAGNOSIS — F329 Major depressive disorder, single episode, unspecified: Secondary | ICD-10-CM | POA: Diagnosis not present

## 2016-12-11 DIAGNOSIS — G8929 Other chronic pain: Secondary | ICD-10-CM | POA: Diagnosis not present

## 2016-12-11 DIAGNOSIS — D649 Anemia, unspecified: Secondary | ICD-10-CM | POA: Diagnosis not present

## 2016-12-11 DIAGNOSIS — E785 Hyperlipidemia, unspecified: Secondary | ICD-10-CM | POA: Diagnosis not present

## 2016-12-15 ENCOUNTER — Other Ambulatory Visit: Payer: Self-pay | Admitting: Nurse Practitioner

## 2016-12-15 DIAGNOSIS — F339 Major depressive disorder, recurrent, unspecified: Secondary | ICD-10-CM

## 2016-12-15 DIAGNOSIS — F419 Anxiety disorder, unspecified: Secondary | ICD-10-CM

## 2016-12-16 NOTE — Telephone Encounter (Signed)
Will send in Citalopram 90 days supply.   Please advise about promethazine 25 mg tab?

## 2016-12-17 IMAGING — MR MR KNEE*L* W/O CM
4 of 6 series · 19 of 40 positions shown · non-contrast
Comparison: Left knee x-ray 09/09/2013

CLINICAL DATA: Patient fell September 10, 2014. Landed on the knee.
Patellar pain and decreased range of motion.

EXAM:
MRI OF THE LEFT KNEE WITHOUT CONTRAST
TECHNIQUE: Multiplanar, multisequence MR imaging of the knee was performed. No
intravenous contrast was administered.

[Series 2: PD fat-sat · axial · 4.0mm · 0.29mm/px · z∈[-63,+62]mm · 9 of 26 slices shown (1 of 4)]
[im 1/26]
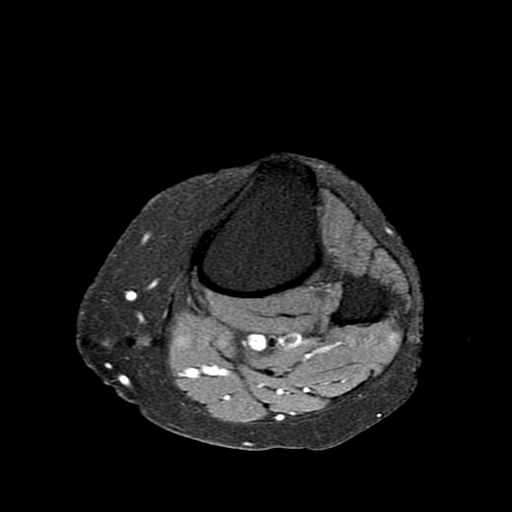
[im 4/26]
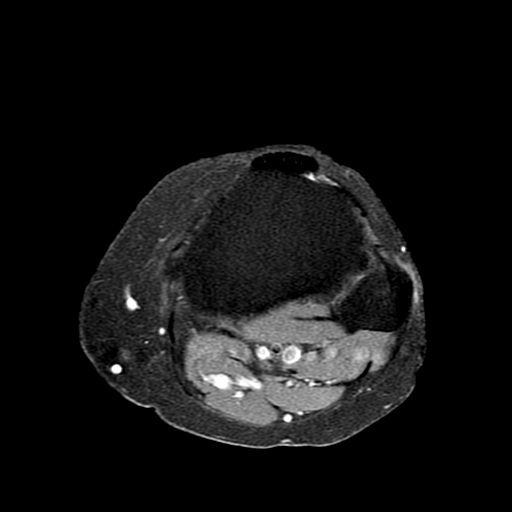
[im 7/26]
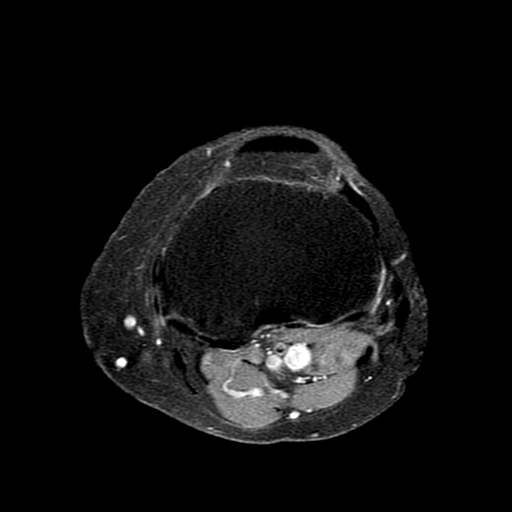
[im 10/26]
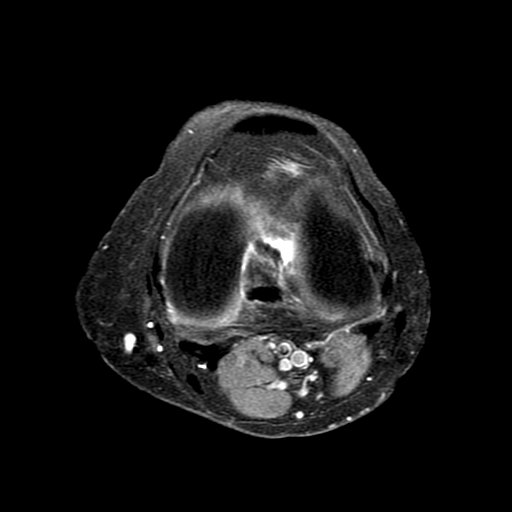
[im 13/26]
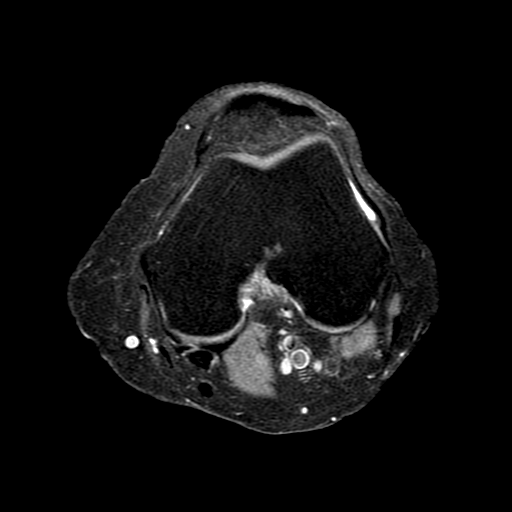
[im 16/26]
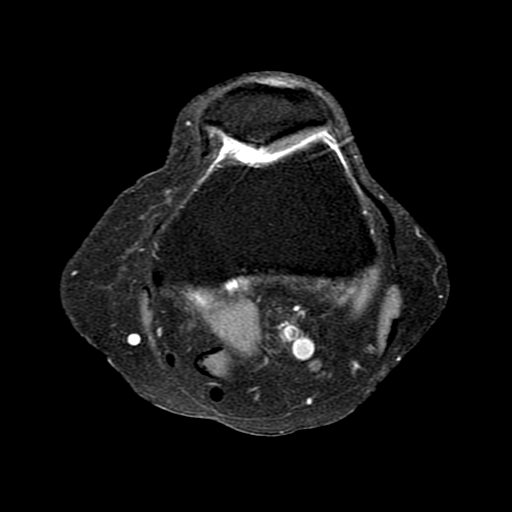
[im 19/26]
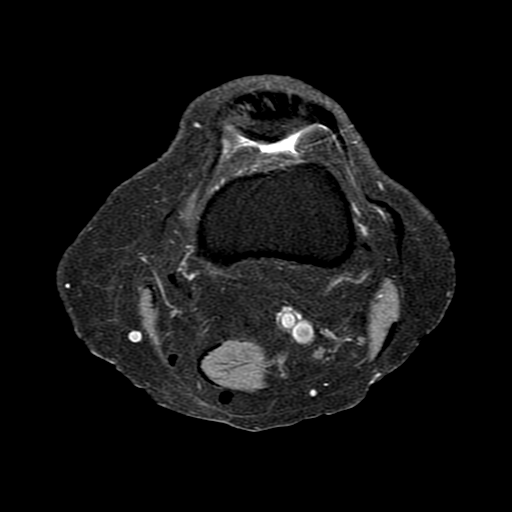
[im 22/26]
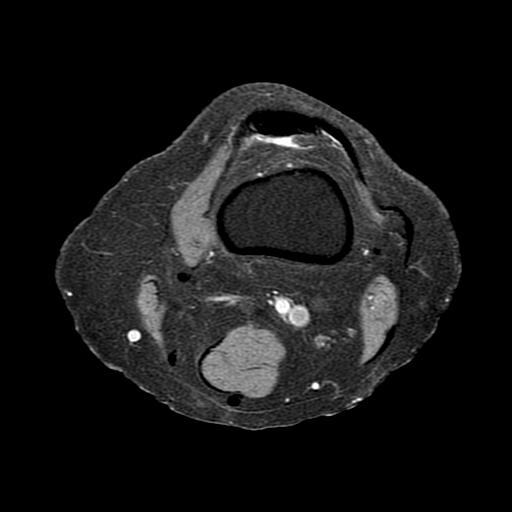
[im 26/26]
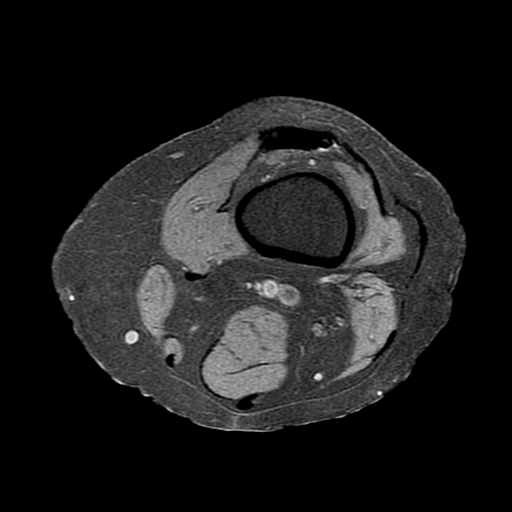

[Series 3: PD fat-sat · coronal · 4.0mm · 0.31mm/px · 4 of 23 slices shown (2 of 4)]
[im 1/23]
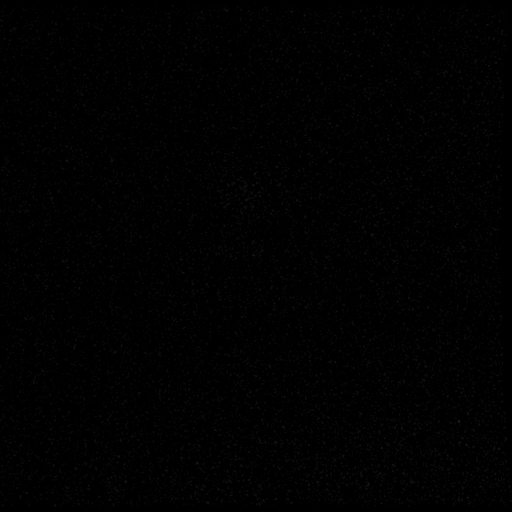
[im 4/23]
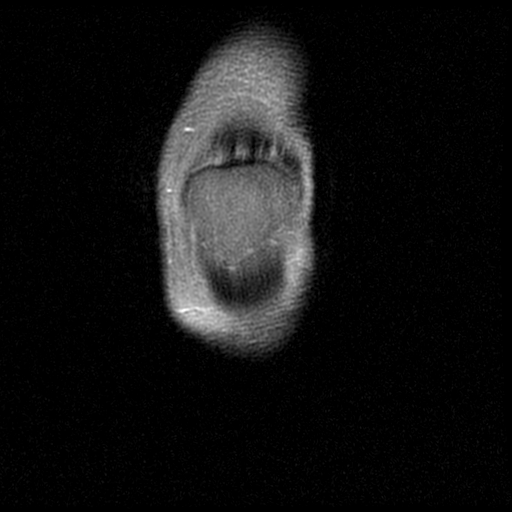
[im 12/23]
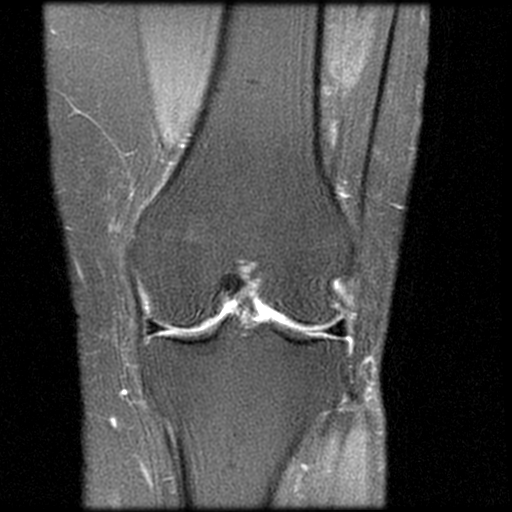
[im 19/23]
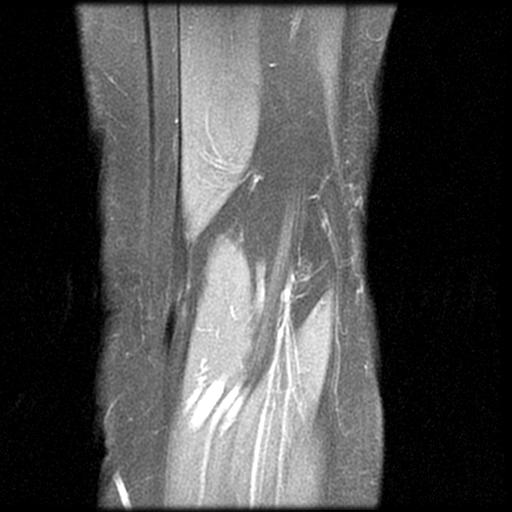

[Series 6: PD fat-sat · sagittal · 4.0mm · 0.29mm/px · 3 of 22 slices shown (3 of 4)]
[im 4/22]
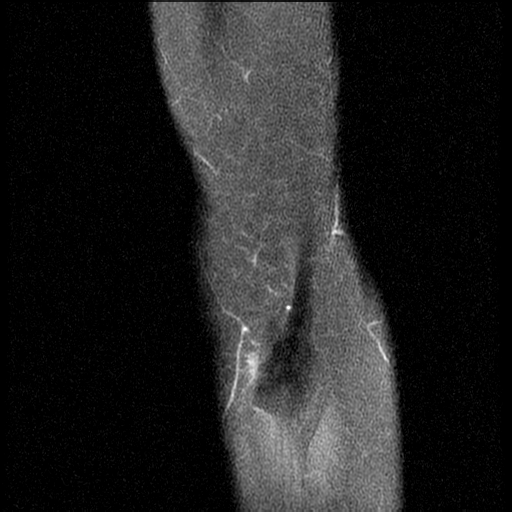
[im 11/22]
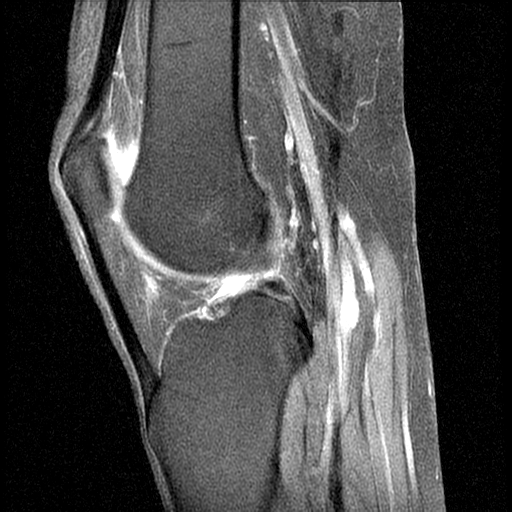
[im 18/22]
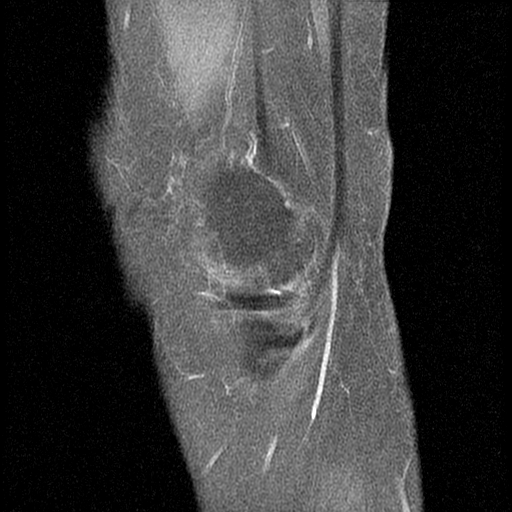

[Series 8: PD fat-sat · coronal · 2.0mm · 0.29mm/px · 3 of 10 slices shown (4 of 4)]
[im 1/10]
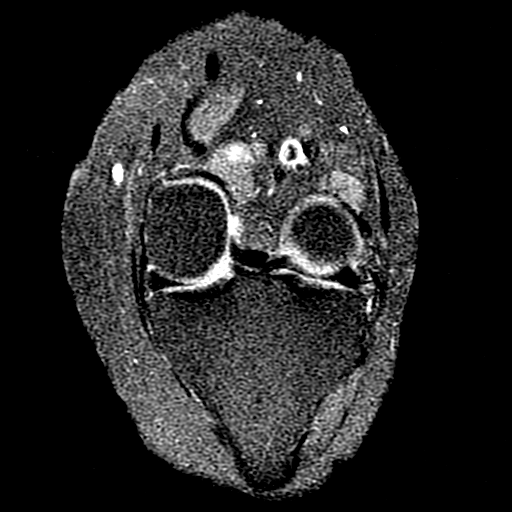
[im 5/10]
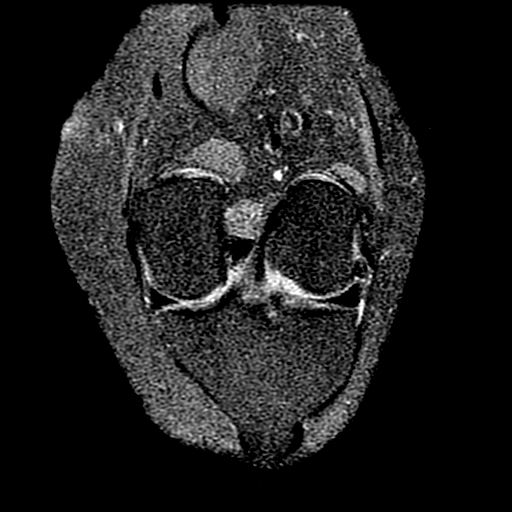
[im 10/10]
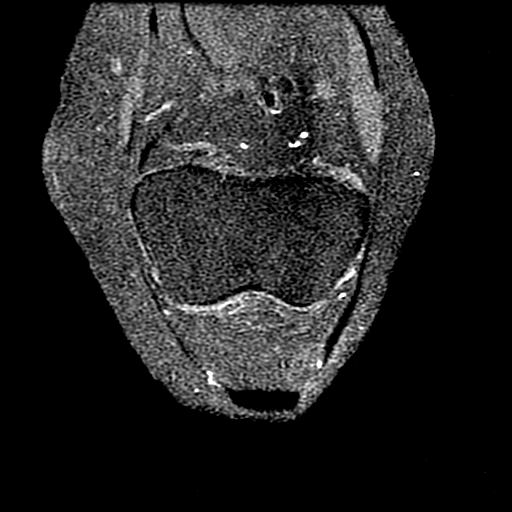

[19 of 40 positions shown; findings below may reference images not displayed]

FINDINGS: MENISCI

Medial meniscus:  Intact.

Lateral meniscus:  Intact.

LIGAMENTS

Cruciates:  Intact ACL and PCL.

Collaterals: Medial collateral ligament is intact. Lateral
collateral ligament complex is intact.

CARTILAGE

Patellofemoral: Full-thickness cartilage loss of the medial patellar
facet.

Medial:  No focal chondral defect.

Lateral:  No focal chondral defect.

Joint: Small joint effusion. Mild edema in Hoffa's fat. No plical
thickening.

Popliteal Fossa:  Tiny Baker's cyst.  Intact popliteus tendon.

Extensor Mechanism:  Intact.

Bones: No focal marrow signal abnormality. No fracture or
dislocation.
IMPRESSION: 1. No acute injury of the left knee.
2. Full-thickness cartilage loss of the medial patellar facet.
3. Mild edema in Hoffa's fat which may be secondary to direct
injury.

## 2016-12-17 NOTE — Telephone Encounter (Signed)
90tabs of citalopram. 20tabs of promethazine

## 2016-12-19 ENCOUNTER — Ambulatory Visit (INDEPENDENT_AMBULATORY_CARE_PROVIDER_SITE_OTHER): Payer: Medicare Other | Admitting: *Deleted

## 2016-12-19 DIAGNOSIS — J309 Allergic rhinitis, unspecified: Secondary | ICD-10-CM | POA: Diagnosis not present

## 2016-12-25 DIAGNOSIS — M542 Cervicalgia: Secondary | ICD-10-CM | POA: Diagnosis not present

## 2016-12-25 DIAGNOSIS — R51 Headache: Secondary | ICD-10-CM | POA: Diagnosis not present

## 2016-12-25 DIAGNOSIS — G43719 Chronic migraine without aura, intractable, without status migrainosus: Secondary | ICD-10-CM | POA: Diagnosis not present

## 2016-12-25 DIAGNOSIS — M791 Myalgia: Secondary | ICD-10-CM | POA: Diagnosis not present

## 2016-12-26 ENCOUNTER — Other Ambulatory Visit: Payer: Self-pay | Admitting: Nurse Practitioner

## 2016-12-26 ENCOUNTER — Telehealth: Payer: Self-pay

## 2016-12-26 DIAGNOSIS — R42 Dizziness and giddiness: Secondary | ICD-10-CM

## 2016-12-26 MED ORDER — PROMETHAZINE HCL 25 MG PO TABS: 25.0000 mg | ORAL_TABLET | Freq: Four times a day (QID) | ORAL | 0 refills | Status: DC | PRN

## 2016-12-26 NOTE — Telephone Encounter (Signed)
Patient called in requesting phenergen and refill on meclizine----routing to [place], please advise, thanks

## 2016-12-26 NOTE — Telephone Encounter (Signed)
Rec'd call pt states she called for refill on her promethazine, but [facility] have not received anything. Inform per chart Baldo Ash approved #[id number] will send script to [facility]...Johny Chess

## 2016-12-26 NOTE — Telephone Encounter (Signed)
No refill at this time. She should have an upcoming appt with me. Will determine need for refill at that time

## 2016-12-26 NOTE — Telephone Encounter (Signed)
Routing to [place], please advise, thanks 

## 2016-12-27 NOTE — Telephone Encounter (Signed)
Left message advising of [place] note/instructions, call back with any further questions

## 2016-12-30 DIAGNOSIS — G629 Polyneuropathy, unspecified: Secondary | ICD-10-CM | POA: Diagnosis not present

## 2016-12-30 DIAGNOSIS — G5600 Carpal tunnel syndrome, unspecified upper limb: Secondary | ICD-10-CM | POA: Diagnosis not present

## 2017-01-01 ENCOUNTER — Encounter: Payer: Self-pay | Admitting: Nurse Practitioner

## 2017-01-01 ENCOUNTER — Telehealth: Payer: Self-pay

## 2017-01-01 ENCOUNTER — Ambulatory Visit (INDEPENDENT_AMBULATORY_CARE_PROVIDER_SITE_OTHER): Payer: Medicare Other | Admitting: Nurse Practitioner

## 2017-01-01 ENCOUNTER — Encounter: Payer: Self-pay | Admitting: Allergy & Immunology

## 2017-01-01 ENCOUNTER — Ambulatory Visit (INDEPENDENT_AMBULATORY_CARE_PROVIDER_SITE_OTHER): Payer: Medicare Other | Admitting: Allergy & Immunology

## 2017-01-01 ENCOUNTER — Ambulatory Visit
Admission: RE | Admit: 2017-01-01 | Discharge: 2017-01-01 | Disposition: A | Discharge status: Home / Self Care | Payer: Medicare Other | Source: Ambulatory Visit | Attending: Nurse Practitioner | Admitting: Nurse Practitioner

## 2017-01-01 VITALS — BP 116/74 | HR 87 | Temp 98.2°F | Ht 61.5 in | Wt 141.0 lb

## 2017-01-01 VITALS — BP 130/70 | HR 86 | Temp 98.5°F | Resp 16 | Ht 61.5 in | Wt 134.0 lb

## 2017-01-01 DIAGNOSIS — M542 Cervicalgia: Secondary | ICD-10-CM | POA: Diagnosis not present

## 2017-01-01 DIAGNOSIS — J302 Other seasonal allergic rhinitis: Secondary | ICD-10-CM | POA: Insufficient documentation

## 2017-01-01 DIAGNOSIS — Z91148 Patient's other noncompliance with medication regimen for other reason: Secondary | ICD-10-CM

## 2017-01-01 DIAGNOSIS — R221 Localized swelling, mass and lump, neck: Secondary | ICD-10-CM

## 2017-01-01 DIAGNOSIS — F17210 Nicotine dependence, cigarettes, uncomplicated: Secondary | ICD-10-CM | POA: Diagnosis not present

## 2017-01-01 DIAGNOSIS — Z9114 Patient's other noncompliance with medication regimen: Secondary | ICD-10-CM

## 2017-01-01 DIAGNOSIS — R42 Dizziness and giddiness: Secondary | ICD-10-CM

## 2017-01-01 DIAGNOSIS — R590 Localized enlarged lymph nodes: Secondary | ICD-10-CM

## 2017-01-01 DIAGNOSIS — R1310 Dysphagia, unspecified: Secondary | ICD-10-CM | POA: Diagnosis not present

## 2017-01-01 DIAGNOSIS — J3089 Other allergic rhinitis: Secondary | ICD-10-CM | POA: Diagnosis not present

## 2017-01-01 DIAGNOSIS — J4540 Moderate persistent asthma, uncomplicated: Secondary | ICD-10-CM | POA: Diagnosis not present

## 2017-01-01 MED ORDER — MECLIZINE HCL 25 MG PO TABS: 25.0000 mg | ORAL_TABLET | Freq: Two times a day (BID) | ORAL | 0 refills | Status: DC | PRN

## 2017-01-01 MED ORDER — MONTELUKAST SODIUM 10 MG PO TABS: 10.0000 mg | ORAL_TABLET | Freq: Every day | ORAL | 5 refills | Status: DC

## 2017-01-01 MED ORDER — FLUTICASONE FUROATE 200 MCG/ACT IN AEPB: 1.0000 | INHALATION_SPRAY | Freq: Every day | RESPIRATORY_TRACT | 5 refills | Status: DC

## 2017-01-01 NOTE — Progress Notes (Signed)
Subjective:  Patient ID: Lori [name], female    DOB: [birth year]  Age: 61 y.o. MRN: [id number]  CC: Follow-up (neck swollen,pain, cant eat going on for 2 wks. dizzy med consult?)   Neck Pain   This is a new problem. The current episode started 1 to 4 weeks ago. The problem occurs constantly. The problem has been unchanged. The pain is associated with nothing. The quality of the pain is described as aching. Exacerbated by: swallowing and palpation. Associated symptoms include pain with swallowing. Pertinent negatives include no chest pain, fever, headaches, leg pain, numbness, paresis, photophobia, syncope, tingling, trouble swallowing, visual change, weakness or weight loss.   Migraine Headache: managed by [facility] with [facility]. Current use of scalp injections (she is unsure of type of injections  Dizziness: Intermittent. Worse with rapid head movement. Syncopal episode 2016: MRI and Headt CT done (no acute finding). Onset for several years(first noted 2016) Use of meclizine daily as needed. She is not interested in physical therapy.  Outpatient Medications Prior to Visit  Medication Sig Dispense Refill  . albuterol (PROVENTIL HFA;VENTOLIN HFA) 108 (90 Base) MCG/ACT inhaler Inhale 2 puffs into the lungs every 6 (six) hours as needed for wheezing or shortness of breath. 1 Inhaler 3  . butalbital-acetaminophen-caffeine (FIORICET, ESGIC) 50-325-40 MG tablet Take 1 tablet by mouth every 6 (six) hours as needed for headache.    . citalopram (CELEXA) 20 MG tablet TAKE 1 TABLET(20 MG) BY MOUTH DAILY 90 tablet 0  . cyclobenzaprine (FLEXERIL) 5 MG tablet Take 1 tablet (5 mg total) by mouth 3 (three) times daily as needed for muscle spasms. 30 tablet 0  . cyclobenzaprine (FLEXERIL) 5 MG tablet Take 1 tablet (5 mg total) by mouth 3 (three) times daily as needed for muscle spasms. 30 tablet 0  . dexlansoprazole (DEXILANT) 60 MG capsule Take 1 capsule (60 mg total) by mouth  daily. 30 capsule 6  . EPINEPHrine 0.3 mg/0.3 mL IJ SOAJ injection Use as directed for severe allergic reaction 2 Device 1  . famotidine (PEPCID) 20 MG tablet TK 1 T PO BID  0  . fluticasone (FLONASE) 50 MCG/ACT nasal spray Place 2 sprays into both nostrils daily. 16 g 0  . Fluticasone Furoate (ARNUITY ELLIPTA) 200 MCG/ACT AEPB Inhale 1 puff into the lungs daily. 30 each 5  . montelukast (SINGULAIR) 10 MG tablet Take 1 tablet (10 mg total) by mouth at bedtime. 30 tablet 5  . nitroGLYCERIN (NITROSTAT) 0.4 MG SL tablet PLACE 1 TABLET UNDER THE TONGUE EVERY 5 MINUTES AS NEEDED FOR CHEST PAIN 25 tablet 1  . PREVNAR 13 SUSP injection     . SHINGRIX injection ADMINISTER AS DIRECTED  0  . tiotropium (SPIRIVA HANDIHALER) 18 MCG inhalation capsule Place 1 capsule (18 mcg total) into inhaler and inhale daily. 30 capsule 3  . valACYclovir (VALTREX) 500 MG tablet Take 500 mg by mouth daily.    Marland Kitchen doxycycline (VIBRA-TABS) 100 MG tablet TK 1 T PO BID  0  . DULERA 200-5 MCG/ACT AERO Inhale 1 puff into the lungs 2 (two) times daily.    . fenofibrate (TRICOR) 145 MG tablet     . gabapentin (NEURONTIN) 300 MG capsule     . loperamide (IMODIUM) 2 MG capsule     . simvastatin (ZOCOR) 20 MG tablet TAKE 1 TABLET BY MOUTH EVERY EVENING BEFORE BEDTIME FOR CHOLESTEROL 30 tablet 4  . traZODone (DESYREL) 50 MG tablet   2  . VIRTUSSIN A/C  100-10 MG/5ML syrup TK 5MLS PO TID PRN FOR COUGH  0  . acetaminophen (TYLENOL) 500 MG tablet Take 2 tablets (1,000 mg total) by mouth every 6 (six) hours as needed. (Patient not taking: Reported on 12/03/2016) 30 tablet 0  . benzonatate (TESSALON) 100 MG capsule Take 2 capsules (200 mg total) by mouth 3 (three) times daily as needed (excessive coughing). (Patient not taking: Reported on 01/01/2017) 25 capsule 0  . LORazepam (ATIVAN) 0.5 MG tablet Take 1 tablet (0.5 mg total) by mouth every 8 (eight) hours as needed for anxiety. (Patient not taking: Reported on 01/01/2017) 20 tablet 0  .  meclizine (ANTIVERT) 25 MG tablet Take 1 tablet (25 mg total) by mouth 3 (three) times daily as needed for dizziness. Only as needed for DIZZINESS (Patient not taking: Reported on 01/01/2017) 21 tablet 0  . nicotine (NICODERM CQ - DOSED IN MG/24 HOURS) 14 mg/24hr patch Place 1 patch (14 mg total) onto the skin daily. (Patient not taking: Reported on 12/03/2016) 28 patch 0  . predniSONE (DELTASONE) 20 MG tablet Take 3 tablets by mouth daily X 1 day; then 2 tablets by mouth daily X 2 days; then 1 tablet by mouth daily X 3 days; then 1/2 tablet by mouth daily X 3 days and stop prednisone. (Patient not taking: Reported on 12/03/2016) 12 tablet 0  . promethazine (PHENERGAN) 25 MG tablet Take 1 tablet (25 mg total) by mouth every 6 (six) hours as needed for nausea or vomiting. (Patient not taking: Reported on 01/01/2017) 20 tablet 0  . promethazine-dextromethorphan (PROMETHAZINE-DM) 6.25-15 MG/5ML syrup Take 5 mLs by mouth 4 (four) times daily as needed for cough. (Patient not taking: Reported on 01/01/2017) 118 mL 0  . ranitidine (ZANTAC) 150 MG tablet Take 1 tablet (150 mg total) by mouth at bedtime. (Patient not taking: Reported on 01/01/2017) 30 tablet 3  . rosuvastatin (CRESTOR) 20 MG tablet Take 1 tablet (20 mg total) by mouth daily. (Patient not taking: Reported on 01/01/2017) 90 tablet 3  . traMADol (ULTRAM) 50 MG tablet Take 1 tablet (50 mg total) by mouth every 8 (eight) hours as needed for severe pain. (Patient not taking: Reported on 12/03/2016) 20 tablet 0  . triamcinolone ointment (KENALOG) 0.1 % Apply 1 application topically 2 (two) times daily. (Patient not taking: Reported on 01/01/2017) 30 g 0   No facility-administered medications prior to visit.     ROS See HPI  Objective:  BP 116/74   Pulse 87   Temp 98.2 F (36.8 C)   Ht 5' 1.5" (1.562 m)   Wt 141 lb (64 kg)   SpO2 98%   BMI 26.21 kg/m   BP Readings from Last 3 Encounters:  01/01/17 116/74  01/01/17 130/70  12/03/16 98/72     Wt Readings from Last 3 Encounters:  01/01/17 141 lb (64 kg)  01/01/17 134 lb (60.8 kg)  12/03/16 139 lb 12.8 oz (63.4 kg)    Physical Exam  Constitutional: She is oriented to person, place, and time. No distress.  HENT:  Right Ear: External ear normal.  Left Ear: External ear normal.  Nose: Nose normal.  Mouth/Throat: Oropharynx is clear and moist. No oropharyngeal exudate.  Neck: Normal range of motion and full passive range of motion without pain. Neck supple. Tracheal tenderness and muscular tenderness present. No spinous process tenderness present. No tracheal deviation, no erythema and normal range of motion present. No thyroid mass and no thyromegaly present.  Cardiovascular: Normal rate and normal heart  sounds.   Pulmonary/Chest: Effort normal and breath sounds normal. No stridor.  Musculoskeletal: She exhibits no edema or tenderness.  Unsteady gait (chronic).   Lymphadenopathy:    She has cervical adenopathy.  Neurological: She is alert and oriented to person, place, and time.  Vitals reviewed.   Lab Results  Component Value Date   WBC 10.9 (H) 10/27/2016   HGB 10.9 (L) 10/27/2016   HCT 32.4 (L) 10/27/2016   PLT 268 10/27/2016   GLUCOSE 88 11/20/2016   CHOL 201 (H) 11/20/2016   TRIG 277 (H) 11/20/2016   HDL 42 11/20/2016   LDLDIRECT 187.0 10/21/2016   LDLCALC 104 (H) 11/20/2016   ALT 12 11/20/2016   AST 9 11/20/2016   NA 139 11/20/2016   K 4.3 11/20/2016   CL 101 11/20/2016   CREATININE 0.83 11/20/2016   BUN 6 (L) 11/20/2016   CO2 23 11/20/2016   TSH 1.74 06/21/2016   INR 0.9 08/31/2008   HGBA1C 5.5 06/21/2016    Ct Coronary Morph W/cta Cor W/score W/ca W/cm &/or Wo/cm  Addendum Date: 12/02/2016   ADDENDUM REPORT: 12/02/2016 13:45 CLINICAL DATA:  61 year old female with hyperlipidemia and atypical chest pain. EXAM: Cardiac/Coronary  CT TECHNIQUE: The patient was scanned on a Philips 256 scanner. FINDINGS: A 120 kV prospective scan was triggered in  the descending thoracic aorta at 111 HU's. Axial non-contrast 3 mm slices were carried out through the heart. The data set was analyzed on a dedicated work station and scored using the [place]. Gantry rotation speed was 270 msecs and collimation was .9 mm. 10 mg of iv Metoprolol and 0.8 mg of sl NTG was given. The 3D data set was reconstructed in 5% intervals of the 67-82 % of the R-R cycle. Diastolic phases were analyzed on a dedicated work station using MPR, MIP and VRT modes. The patient received 80 cc of contrast. Aorta: Normal size. No dissection. Mild diffuse calcifications in the aortic arch. Aortic Valve:  Trileaflet.  No calcifications. Coronary Arteries:  Normal origin.  Right dominance. Left main artery is a large artery that gives rise to LAD and LCX arteries and has no plaque. LAD is a large artery that gives rise to one diagonal branch and has no plaque. LCX artery is a medium size non-dominant vessel that gives rise to one OM branch and has no plaque. RCA is a large dominant artery that gives rise to PDA and a small PLVB and has no plaque. Normal size of the pulmonary artery. Normal pulmonary veins drainage into the left atrium. No left atrial appendage thrombus. No ASD/VSD. IMPRESSION: 1. Coronary calcium score of 0. This was 0 percentile for age and sex matched control. 2. Normal coronary origin.  Right dominance. 3. No evidence of CAD.  Consider non-cardiac origin of chest pain. Ena Dawley Electronically Signed   By: Ena Dawley   On: 12/02/2016 13:45   Result Date: 12/02/2016 EXAM: OVER-READ INTERPRETATION  CT CHEST The following report is an over-read performed by radiologist Dr. Collene Leyden [facility] [place] on 12/02/2016. This over-read does not include interpretation of cardiac or coronary anatomy or pathology. The coronary CTA interpretation by the cardiologist is attached. COMPARISON:  None. FINDINGS: Cardiovascular: Heart is normal size. Aorta is normal caliber  with scattered calcifications. Mediastinum/Nodes: No adenopathy in the visualized mediastinum or hila. Lungs/Pleura: Mild centrilobular emphysema. Dependent atelectasis in the lower lobes. No effusions. Upper Abdomen: No acute findings in the upper abdomen. Musculoskeletal: Chest wall soft tissues are unremarkable.  No acute bony abnormality. IMPRESSION: No acute extracardiac abnormality. Mild centrilobular emphysema. Mild aortic atherosclerosis. Electronically Signed: By: Rolm Baptise M.D. On: 12/02/2016 11:11    Assessment & Plan:   Robertta was seen today for follow-up.  Diagnoses and all orders for this visit:  Neck pain -     US SOFT TISSUE NECK; Future  Anterior cervical lymphadenopathy -     US SOFT TISSUE NECK; Future  Vertigo -     meclizine (ANTIVERT) 25 MG tablet; Take 1 tablet (25 mg total) by mouth 2 (two) times daily as needed for dizziness. Only as needed for DIZZINESS   I have discontinued Ms. Vendetti's acetaminophen, benzonatate, LORazepam, nicotine, traMADol, predniSONE, promethazine-dextromethorphan, triamcinolone ointment, rosuvastatin, ranitidine, and promethazine. I have also changed her meclizine. Additionally, I am having her maintain her fluticasone, EPINEPHrine, simvastatin, cyclobenzaprine, DULERA, valACYclovir, butalbital-acetaminophen-caffeine, albuterol, tiotropium, cyclobenzaprine, dexlansoprazole, nitroGLYCERIN, citalopram, doxycycline, famotidine, fenofibrate, gabapentin, VIRTUSSIN A/C, loperamide, PREVNAR 13, traZODone, SHINGRIX, montelukast, and Fluticasone Furoate.  Meds ordered this encounter  Medications  . meclizine (ANTIVERT) 25 MG tablet    Sig: Take 1 tablet (25 mg total) by mouth 2 (two) times daily as needed for dizziness. Only as needed for DIZZINESS    Dispense:  20 tablet    Refill:  0    Order Specific Question:   Supervising Provider    Answer:   Cassandria Anger [[id number]]   Follow-up: Return if symptoms worsen or fail to  improve.  Wilfred Lacy, NP

## 2017-01-01 NOTE — Progress Notes (Signed)
FOLLOW UP  Date of Service/Encounter:  01/01/17   Assessment:   Moderate persistent asthma, uncomplicated  Perennial allergic rhinitis  Neck swelling  Non compliance w medication regimen  Cigarette smoker   Asthma Reportables:  Severity: moderate persistent  Risk: high Control: not well controlled   Plan/Recommendations:   1. Moderate persistent asthma - complicated by cigarette use - Lung testing looked good today. - It is very important that you take your Arnuity once daily to prevent flares. - Daily controller medication(s): Arnuity 252mcg once daily - Rescue medications: ProAir 4 puffs every 4-6 hours as needed or albuterol nebulizer one vial puffs every 4-6 hours as needed - Asthma control goals:  * Full participation in all desired activities (may need albuterol before activity) * Albuterol use two time or less a week on average (not counting use with activity) * Cough interfering with sleep two time or less a month * Oral steroids no more than once a year * No hospitalizations  2. Perennial allergic rhinitis - Continue with Nasacort 2 sprays per nostril daily.  - Continue with Singulair 10mg  daily. - Continue with cetirizine (Zyrtec) 10mg  daily to help with the sneezing and other allergy symptoms.  - Continue allergy shots at our clinic.  3. Acute onset neck swelling for two weeks  - I'm unsure what to make of the neck swelling. - Location is not consistent with thyroid involvement, as I would anticipate that this would be lower in the neck. - This could be a submental cyst or abscess, which might need surgical drainage. - Ms. Muha requested multiple times that I just prescribed an antibiotic, but I did not feel comfortable treating a condition of unknown etiology. - We did make several attempts to contact her PCP to see if there were any openings today, but this was unsuccessful. - I encouraged Ms. Serafin to call her PCP as well to see whether there  were any openings.  - I recommended strongly that she seek medical attention at Urgent Care or the ED, as I think that urgent imaging is warranted.   4. Return in about 4 months (around 05/03/2017).   Subjective:   Lori [name] is a 61 y.o. female presenting today for follow up of  Chief Complaint  Patient presents with  . Asthma    Lori [name] has a history of the following: Patient Active Problem List   Diagnosis Date Noted  . Perennial allergic rhinitis 01/01/2017  . Moderate persistent asthma, uncomplicated 69/48/5462  . Pulmonary emphysema ([place]) 12/03/2016  . Tobacco use disorder 12/03/2016  . Chronic seasonal allergic rhinitis 12/03/2016  . Medication management 11/29/2016  . Cough 11/23/2016  . Rash 11/23/2016  . Gastroesophageal reflux disease with esophagitis 10/22/2016  . Non compliance w medication regimen 08/01/2016  . Allergic contact dermatitis due to metals 08/01/2016  . Migraine without aura and without status migrainosus, not intractable 07/22/2016  . Chronic rhinitis 06/19/2016  . Gait difficulty 06/21/2015  . Fibromyalgia 06/21/2015  . Conversion reaction 06/21/2015  . Depression 06/21/2015  . Syncope and collapse 06/21/2015  . Plica syndrome of left knee 01/06/2015  . Anxiety state 09/06/2014  . History of MI 09/06/2014  . Cigarette smoker 06/27/2013  . Borderline hypertension 06/27/2013  . Leg pain, bilateral 06/14/2013  . GASTROPARESIS 10/26/2009  . Hyperlipidemia LDL goal <100 11/18/2008  . Asthma 11/18/2008  . HEPATIC CYST 11/18/2008    History obtained from: chart review and patient and her friend who always accompanies  her to her office visits.  Lori [name] was referred by Wilfred Lacy, NP.     Lileigh is a 61 y.o. female presenting for a follow up visit. She was last seen in January 2018. At that time, we continued her on Arnuity 200 g once daily as well as pro-air as needed. She had previously been on a different regimen, but  we have been working up by her regimen so that she is more compliant. We also diagnosed her with sinusitis and started her on 2 weeks of Augmentin. She has a history of recurrent infections requiring 10 courses of antibiotics 2017. Therefore we did order an immune workup, including vaccination titers, IgM levels, and complete blood count. All of these were normal. She did have a borderline low IgG level, which I felt was secondary to her recurrent steroids over the last calendar year. I did feel that many of her infections are related to her smoking; she was going to talk to her primary care provider about starting Chantix. For her allergic rhinitis, we continued her on Nasacort 2 sprays per nostril daily, Singulair 10 mg daily, and cetirizine 10 mg daily. She is also on allergy shots, which she receives monthly.  Since last visit, she has done well per the patient. She reports that she is not using any of her asthma or allergy medications at all because she thinks that she does not need them. Lori [name]'s asthma has been well controlled. She has not required rescue medication, experienced nocturnal awakenings due to lower respiratory symptoms, nor have activities of daily living been limited. However, it should be noted that she was hospitalized in February 2018 secondary to a COPD/asthma exacerbation combined with CAP. She was treated with antibiotics with resolution of her symptoms. Despite his hospital stay, however, she still feels that she does not need her asthma medications.  From an allergic rhinitis perspective, she has done well. She is only getting her allergy shots. She was on Nasacort, Zyrtec, and Singulair which she no longer uses. She does want refills on all of these. She continues to smoke. She was put on the nicotine patch while she was hospitalized, but this did not work. She has also tried nicotine gum without success. She has not been placed on Chantix as of yet. She continues to smoke 1 pack  per day and it is quite evident during our visit today.  Ms. Scalf also is complaining about swelling in the submental portion of her neck. This is been worsening over the past 2-3 weeks. She denies fever but does endorse some pain with swallowing. She does not have any known thyroid problems. She denies gum or sinus pain. She has not seemed to help for this. This is never happened previously. She does have a primary care appointment on May 4 and plans to discuss this with her primary care provider at that time.  Otherwise, there have been no changes to her past medical history, surgical history, family history, or social history. Aside from the hospitalization, she has not needed any antibiotic treatment.     Review of Systems: a 14-point review of systems is pertinent for what is mentioned in HPI.  Otherwise, all other systems were negative. Constitutional: negative other than that listed in the HPI Eyes: negative other than that listed in the HPI Ears, nose, mouth, throat, and face: negative other than that listed in the HPI Respiratory: negative other than that listed in the HPI Cardiovascular: negative other than that listed  in the HPI Gastrointestinal: negative other than that listed in the HPI Genitourinary: negative other than that listed in the HPI Integument: negative other than that listed in the HPI Hematologic: negative other than that listed in the HPI Musculoskeletal: negative other than that listed in the HPI Neurological: negative other than that listed in the HPI Allergy/Immunologic: negative other than that listed in the HPI    Objective:   Blood pressure 130/70, pulse 86, temperature 98.5 F (36.9 C), temperature source Oral, resp. rate 16, height 5' 1.5" (1.562 m), weight 134 lb (60.8 kg), SpO2 96 %. Body mass index is 24.91 kg/m.   Physical Exam:  General: Alert, interactive, in no acute distress. Enlarged facies, especially in the midline submental  region. Eyes: No conjunctival injection present on the right, No conjunctival injection present on the left, PERRL bilaterally, No discharge on the right, No discharge on the left and No Horner-Trantas dots present Ears: Right TM pearly gray with normal light reflex, Left TM pearly gray with normal light reflex, Right TM intact without perforation and Left TM intact without perforation.  Nose/Throat: External nose within normal limits and septum midline, turbinates edematous and pale with clear discharge, post-pharynx mildly erythematous without cobblestoning in the posterior oropharynx. Tonsils 2+ without exudates Neck: Supple without thyromegaly. Lungs: Decreased breath sounds bilaterally without wheezing, rhonchi or rales. No increased work of breathing. CV: Normal S1/S2, no murmurs. Capillary refill <2 seconds.  Skin: Warm and dry, without lesions or rashes. Neuro:   Grossly intact. No focal deficits appreciated. Responsive to questions.   weeks. Diagnostic studies:  Spirometry: results normal (FEV1: 1.58/70%, FVC: 2.27/82%, FEV1/FVC: 69%).    Spirometry consistent with normal pattern. Compared to her last spirometry, her FVC has slightly decreased but overall remains within the normal range.   Allergy Studies: none    Salvatore Marvel, MD [facility]

## 2017-01-01 NOTE — Telephone Encounter (Signed)
Lori [name]'s PCP numerous times and was unable to get through. Dr. Ernst Bowler wanted [place] to call to see if they could see Lori [name] today in regards of her swollen neck that has lasted for 2 weeks,and it has a little redness to it. I called number [phone] (PCP)

## 2017-01-01 NOTE — Patient Instructions (Addendum)
1. Moderate persistent asthma, uncomplicated - Lung testing looked good today. - It is very important that you take your Arnuity once daily to prevent flares. - Daily controller medication(s): Arnuity 243mcg once daily - Rescue medications: ProAir 4 puffs every 4-6 hours as needed or albuterol nebulizer one vial puffs every 4-6 hours as needed - Asthma control goals:  * Full participation in all desired activities (may need albuterol before activity) * Albuterol use two time or less a week on average (not counting use with activity) * Cough interfering with sleep two time or less a month * Oral steroids no more than once a year * No hospitalizations  2. Perennial allergic rhinitis - Continue with Nasacort 2 sprays per nostril daily. - Continue with Singulair 10mg  daily. - Continue with cetirizine (Zyrtec) 10mg  daily to help with the sneezing and other allergy symptoms.  - Continue allergy shots at our clinic.  3. Neck swelling for two weeks  - I think you need to go to Urgent Care to get this evaluated. - They might need to do an ultrasound or something else to look at this.   4. Return in about 4 months (around 05/03/2017).  Please inform us of any Emergency Department visits, hospitalizations, or changes in symptoms. Call us before going to the ED for breathing or allergy symptoms since we might be able to fit you in for a sick visit. Feel free to contact us anytime with any questions, problems, or concerns.  It was a pleasure to see you and your family again today! Happy spring!   Websites that have reliable patient information: 1. American Academy of Asthma, Allergy, and Immunology: www.aaaai.org 2. Food Allergy Research and Education (FARE): foodallergy.org 3. Mothers of Asthmatics: http://www.asthmacommunitynetwork.org 4. American College of Allergy, Asthma, and Immunology: www.acaai.org

## 2017-01-01 NOTE — Progress Notes (Signed)
Pre visit review using our clinic review tool, if applicable. No additional management support is needed unless otherwise documented below in the visit note. 

## 2017-01-01 NOTE — Patient Instructions (Addendum)
Go to [facility] today at 3:40pm [place]. [place] [address] [phone].  Patient declined referral to physical therapy.  Provided with home exercises to correct verigo. Do exercise once a day. Encourage adequate oral hydration.  Advised patient about possible side effects with prolonged use of meclizine. She verbalized understanding and stated she will still like to use meclizine prn.  Vertigo Vertigo means that you feel like you are moving when you are not. Vertigo can also make you feel like things around you are moving when they are not. This feeling can come and go at any time. Vertigo often goes away on its own. Follow these instructions at home:  Avoid making fast movements.  Avoid driving.  Avoid using heavy machinery.  Avoid doing any task or activity that might cause danger to you or other people if you would have a vertigo attack while you are doing it.  Sit down right away if you feel dizzy or have trouble with your balance.  Take over-the-counter and prescription medicines only as told by your doctor.  Follow instructions from your doctor about which positions or movements you should avoid.  Drink enough fluid to keep your pee (urine) clear or pale yellow.  Keep all follow-up visits as told by your doctor. This is important. Contact a doctor if:  Medicine does not help your vertigo.  You have a fever.  Your problems get worse or you have new symptoms.  Your family or friends see changes in your behavior.  You feel sick to your stomach (nauseous) or you throw up (vomit).  You have a "pins and needles" feeling or you are numb in part of your body. Get help right away if:  You have trouble moving or talking.  You are always dizzy.  You pass out (faint).  You get very bad headaches.  You feel weak or have trouble using your hands, arms, or legs.  You have changes in your hearing.  You have changes in your seeing (vision).  You get a  stiff neck.  Bright light starts to bother you. This information is not intended to replace advice given to you by your health care provider. Make sure you discuss any questions you have with your health care provider. Document Released: 06/11/2008 Document Revised: 02/08/2016 Document Reviewed: 12/26/2014 Elsevier Interactive Patient Education  2017 [employer].

## 2017-01-02 ENCOUNTER — Other Ambulatory Visit: Payer: Self-pay | Admitting: Allergy & Immunology

## 2017-01-02 MED ORDER — MONTELUKAST SODIUM 10 MG PO TABS: 10.0000 mg | ORAL_TABLET | Freq: Every day | ORAL | 5 refills | Status: DC

## 2017-01-02 NOTE — Telephone Encounter (Signed)
Called patient. Left message informing patient that montelukast is at the pharmacy. If she has any more questions give [place] call.

## 2017-01-02 NOTE — Telephone Encounter (Signed)
Called [facility] to confirm if Montelukast was there or not. We sent in the Montelukast in yesterday 01/01/17. Patient called and said it wasn't there,so called pharmacy to find out. I'm calling patient to inform.

## 2017-01-02 NOTE — Telephone Encounter (Signed)
patient was seen yesterday by gallagher - SINGULAIR was to be called in and was not - patient said that husband went to pharmacy and it was not there [facility] on [address]

## 2017-01-09 ENCOUNTER — Ambulatory Visit (INDEPENDENT_AMBULATORY_CARE_PROVIDER_SITE_OTHER): Payer: Medicare Other | Admitting: Gastroenterology

## 2017-01-09 ENCOUNTER — Encounter: Payer: Self-pay | Admitting: Gastroenterology

## 2017-01-09 VITALS — BP 98/62 | HR 68 | Ht 64.0 in | Wt 146.1 lb

## 2017-01-09 DIAGNOSIS — R635 Abnormal weight gain: Secondary | ICD-10-CM

## 2017-01-09 DIAGNOSIS — R140 Abdominal distension (gaseous): Secondary | ICD-10-CM | POA: Diagnosis not present

## 2017-01-09 DIAGNOSIS — R1084 Generalized abdominal pain: Secondary | ICD-10-CM | POA: Diagnosis not present

## 2017-01-09 MED ORDER — METRONIDAZOLE 250 MG PO TABS: 250.0000 mg | ORAL_TABLET | Freq: Three times a day (TID) | ORAL | 0 refills | Status: AC

## 2017-01-09 MED ORDER — CEPHALEXIN 250 MG PO CAPS
250.0000 mg | ORAL_CAPSULE | Freq: Four times a day (QID) | ORAL | 0 refills | Status: AC
Start: 2017-01-09 — End: 2017-01-16

## 2017-01-09 NOTE — Patient Instructions (Signed)
If you are age 61 or older, your body mass index should be between 23-30. Your Body mass index is 25.08 kg/m. If this is out of the aforementioned range listed, please consider follow up with your Primary Care Provider.  If you are age 23 or younger, your body mass index should be between 19-25. Your Body mass index is 25.08 kg/m. If this is out of the aformentioned range listed, please consider follow up with your Primary Care Provider.   You have been scheduled for an abdominal ultrasound at [facility] (1st floor of hospital) on 01-14-2017 at 830am. Please arrive 15 minutes prior to your appointment for registration. Make certain not to have anything to eat or drink 6 hours prior to your appointment. Should you need to reschedule your appointment, please contact radiology at [phone]. This test typically takes about 30 minutes to perform.  Thank you for choosing [facility]  Dr Wilfrid Lund III

## 2017-01-09 NOTE — Progress Notes (Addendum)
Lori [name]  Chief Complaint: Generalized abdominal pain  Subjective  History:  This was a clinic visit for this 61 year old woman seen by me in December 2017. Please see that [name] and the prior office [name] for details of her long-standing chronic pain syndrome. She saw Dr. Sharlett Iles years ago for the same condition. She was sent back to me for reevaluation by Dr. Cheri Fowler of gynecology. His [name] indicates that I requested the GYN consultation, but there is some miscommunication there. When I last saw the patient for her screening colonoscopy in December 2017, I reinforced that her digestive symptoms are clearly related to her chronic pain syndrome and she has had an extensive and unrevealing workup. Symptoms do not sound gynecologic in nature.  Nevertheless, I reevaluated her today. As before, she has diffuse abdominal and musculoskeletal pain. She has abdominal bloating and feels that she has become more distended. Somewhat dramatically, she was holding her lower back and her abdomen and easing herself down into the chair saying "I feel like I am 9 months pregnant". She wants to know why she has gained about 10 pounds in the last 4 months. Her weight actually appears to have fluctuated over that time but he is up about 10 pounds since December. It is difficult to clear and consistent history from her. When she stands up, she arches her back and pushes her abdominal muscles out to demonstrate how distended she is. It is much less pronounced when she sits or lays down. She denies nausea, vomiting, dysphagia, early satiety or anorexia.  Of [name], she was 3 hours early for today's clinic visit and was worked into the late morning. ROS: Cardiovascular:  no chest pain Respiratory: no dyspnea  The patient's Past Medical, Family and Social History were reviewed and are on file in the EMR.  Objective:  Med list reviewed  Vital signs in last 24 hrs: Vitals:   01/09/17 1132  BP: 98/62  Pulse: 68    Physical Exam  Chronically ill-appearing woman as before. She looks uncomfortable as before, but is distractible.  HEENT: sclera anicteric, oral mucosa moist without lesions  Neck: supple, no thyromegaly, JVD or lymphadenopathy  Cardiac: RRR without murmurs, S1S2 heard, no peripheral edema  Pulm: clear to auscultation bilaterally, normal RR and effort noted  Abdomen: soft, mildly distended, not tympanitic and has normal active bowel sounds. As before, she has mild diffuse tenderness to light palpation of the abdominal wall, also distractible, with active bowel sounds. No guarding or palpable hepatosplenomegaly. No bulging flanks or shifting dullness.  Skin; warm and dry, no jaundice or rash   @ASSESSMENTPLANBEGIN @ Assessment: Encounter Diagnoses  Name Primary?  . Generalized abdominal pain Yes  . Abdominal bloating   . Weight gain    I suspect this is still part and parcel of her chronic pain syndrome. She reports that her bowel habits are regular, 9 no that she had no obstruction on a colonoscopy in December 2017. A few benign adenomatous polyps were removed.  She again asked me for pain medication and I again declined (for the last time).  I have ordered an abdominal ultrasound to be absolutely sure there is no ascites indicating a pathological cause for this recent abdominal distention and weight gain.  Plan: I am also giving her 7 day course of Keflex and Flagyl in the unlikely event that she has small bowel bacterial overgrowth. If the above is unrevealing/unhelpful, then I have no plans for further workup  and no further testing to offer this patient. She seems to have little or no insight into her chronic pain syndrome and the possibility that she may need to live with these symptoms.  Total time 25 minutes, over half spent in counseling and coordination of care.   Lori [name]

## 2017-01-14 ENCOUNTER — Ambulatory Visit (INDEPENDENT_AMBULATORY_CARE_PROVIDER_SITE_OTHER): Payer: Medicare Other | Admitting: *Deleted

## 2017-01-14 ENCOUNTER — Ambulatory Visit (HOSPITAL_COMMUNITY)
Admission: RE | Admit: 2017-01-14 | Discharge: 2017-01-14 | Disposition: A | Discharge status: Home / Self Care | Payer: Medicare Other | Source: Ambulatory Visit | Attending: Gastroenterology | Admitting: Gastroenterology

## 2017-01-14 DIAGNOSIS — R140 Abdominal distension (gaseous): Secondary | ICD-10-CM | POA: Insufficient documentation

## 2017-01-14 DIAGNOSIS — J309 Allergic rhinitis, unspecified: Secondary | ICD-10-CM | POA: Diagnosis not present

## 2017-01-14 DIAGNOSIS — R635 Abnormal weight gain: Secondary | ICD-10-CM | POA: Diagnosis present

## 2017-01-14 DIAGNOSIS — R1084 Generalized abdominal pain: Secondary | ICD-10-CM | POA: Diagnosis not present

## 2017-01-14 DIAGNOSIS — Z9049 Acquired absence of other specified parts of digestive tract: Secondary | ICD-10-CM | POA: Diagnosis not present

## 2017-01-16 DIAGNOSIS — M791 Myalgia: Secondary | ICD-10-CM | POA: Diagnosis not present

## 2017-01-16 DIAGNOSIS — G43719 Chronic migraine without aura, intractable, without status migrainosus: Secondary | ICD-10-CM | POA: Diagnosis not present

## 2017-01-16 DIAGNOSIS — M542 Cervicalgia: Secondary | ICD-10-CM | POA: Diagnosis not present

## 2017-01-16 DIAGNOSIS — R51 Headache: Secondary | ICD-10-CM | POA: Diagnosis not present

## 2017-01-17 ENCOUNTER — Encounter: Payer: Self-pay | Admitting: Nurse Practitioner

## 2017-01-17 ENCOUNTER — Ambulatory Visit (INDEPENDENT_AMBULATORY_CARE_PROVIDER_SITE_OTHER): Payer: Medicare Other | Admitting: Nurse Practitioner

## 2017-01-17 VITALS — BP 114/74 | HR 74 | Temp 98.8°F | Ht 64.0 in | Wt 143.0 lb

## 2017-01-17 DIAGNOSIS — R42 Dizziness and giddiness: Secondary | ICD-10-CM

## 2017-01-17 DIAGNOSIS — R05 Cough: Secondary | ICD-10-CM

## 2017-01-17 DIAGNOSIS — R059 Cough, unspecified: Secondary | ICD-10-CM

## 2017-01-17 DIAGNOSIS — J4540 Moderate persistent asthma, uncomplicated: Secondary | ICD-10-CM | POA: Diagnosis not present

## 2017-01-17 DIAGNOSIS — F339 Major depressive disorder, recurrent, unspecified: Secondary | ICD-10-CM

## 2017-01-17 DIAGNOSIS — M62830 Muscle spasm of back: Secondary | ICD-10-CM

## 2017-01-17 MED ORDER — CYCLOBENZAPRINE HCL 5 MG PO TABS: 5.0000 mg | ORAL_TABLET | Freq: Three times a day (TID) | ORAL | 0 refills | Status: DC | PRN

## 2017-01-17 MED ORDER — BUPROPION HCL ER (XL) 150 MG PO TB24: 150.0000 mg | ORAL_TABLET | Freq: Every day | ORAL | 2 refills | Status: DC

## 2017-01-17 MED ORDER — BENZONATATE 100 MG PO CAPS: 100.0000 mg | ORAL_CAPSULE | Freq: Three times a day (TID) | ORAL | 0 refills | Status: DC | PRN

## 2017-01-17 MED ORDER — DM-GUAIFENESIN ER 30-600 MG PO TB12: 1.0000 | ORAL_TABLET | Freq: Two times a day (BID) | ORAL | 0 refills | Status: DC | PRN

## 2017-01-17 MED ORDER — MECLIZINE HCL 12.5 MG PO TABS: 12.5000 mg | ORAL_TABLET | Freq: Two times a day (BID) | ORAL | 0 refills | Status: DC | PRN

## 2017-01-17 NOTE — Patient Instructions (Addendum)
I strongly recommend physical therapy for vertigo and gait difficulty.  Encourage adequate oral hydration.  I strongly encourage tobacco cessation.  Encourage adequate oral hydration.  If you have medicare related insurance (such as traditional Medicare, [employer], [employer], or similar), Please make an appointment at the scheduling desk with Sharee Pimple, the [employer], for your Wellness visit in this office, which is a benefit with your insurance.

## 2017-01-17 NOTE — Progress Notes (Signed)
Subjective:  Patient ID: Lori [name], female    DOB: [birth year]  Age: 61 y.o. MRN: [id number]  CC: Follow-up (3 mo fu/nervous problem/little cough?dizzy?)   HPI Depression: Stopped taking 32months ago because if was not helpful. Declined referral to psychology and psychiatry.  Vertigo: Persistent. Ongoing since 06/2015. Evaluated by neurology in past Some improvement with meclizine.. Head CT (2016): normal MRI (brain): no acute finding. Declined referral for PT and to neurology.  Outpatient Medications Prior to Visit  Medication Sig Dispense Refill  . albuterol (PROVENTIL HFA;VENTOLIN HFA) 108 (90 Base) MCG/ACT inhaler Inhale 2 puffs into the lungs every 6 (six) hours as needed for wheezing or shortness of breath. 1 Inhaler 3  . butalbital-acetaminophen-caffeine (FIORICET, ESGIC) 50-325-40 MG tablet Take 1 tablet by mouth every 6 (six) hours as needed for headache.    . dexlansoprazole (DEXILANT) 60 MG capsule Take 1 capsule (60 mg total) by mouth daily. 30 capsule 6  . DULERA 200-5 MCG/ACT AERO Inhale 1 puff into the lungs 2 (two) times daily.    Marland Kitchen EPINEPHrine 0.3 mg/0.3 mL IJ SOAJ injection Use as directed for severe allergic reaction 2 Device 1  . famotidine (PEPCID) 20 MG tablet TK 1 T PO BID  0  . fenofibrate (TRICOR) 145 MG tablet     . fluticasone (FLONASE) 50 MCG/ACT nasal spray Place 2 sprays into both nostrils daily. 16 g 0  . Fluticasone Furoate (ARNUITY ELLIPTA) 200 MCG/ACT AEPB Inhale 1 puff into the lungs daily. 30 each 5  . montelukast (SINGULAIR) 10 MG tablet Take 1 tablet (10 mg total) by mouth at bedtime. 30 tablet 5  . nitroGLYCERIN (NITROSTAT) 0.4 MG SL tablet PLACE 1 TABLET UNDER THE TONGUE EVERY 5 MINUTES AS NEEDED FOR CHEST PAIN 25 tablet 1  . PREVNAR 13 SUSP injection     . SHINGRIX injection ADMINISTER AS DIRECTED  0  . simvastatin (ZOCOR) 20 MG tablet TAKE 1 TABLET BY MOUTH EVERY EVENING BEFORE BEDTIME FOR CHOLESTEROL 30 tablet 4  . tiotropium  (SPIRIVA HANDIHALER) 18 MCG inhalation capsule Place 1 capsule (18 mcg total) into inhaler and inhale daily. 30 capsule 3  . traZODone (DESYREL) 50 MG tablet   2  . valACYclovir (VALTREX) 500 MG tablet Take 500 mg by mouth daily.    . citalopram (CELEXA) 20 MG tablet TAKE 1 TABLET(20 MG) BY MOUTH DAILY 90 tablet 0  . cyclobenzaprine (FLEXERIL) 5 MG tablet Take 1 tablet (5 mg total) by mouth 3 (three) times daily as needed for muscle spasms. 30 tablet 0  . cyclobenzaprine (FLEXERIL) 5 MG tablet Take 1 tablet (5 mg total) by mouth 3 (three) times daily as needed for muscle spasms. 30 tablet 0  . doxycycline (VIBRA-TABS) 100 MG tablet TK 1 T PO BID  0  . loperamide (IMODIUM) 2 MG capsule     . meclizine (ANTIVERT) 25 MG tablet Take 1 tablet (25 mg total) by mouth 2 (two) times daily as needed for dizziness. Only as needed for DIZZINESS 20 tablet 0  . VIRTUSSIN A/C 100-10 MG/5ML syrup TK 5MLS PO TID PRN FOR COUGH  0  . gabapentin (NEURONTIN) 300 MG capsule      No facility-administered medications prior to visit.     ROS See HPI  Objective:  BP 114/74   Pulse 74   Temp 98.8 F (37.1 C)   Ht 5\' 4"  (1.626 m)   Wt 143 lb (64.9 kg)   SpO2 98%   BMI 24.55 kg/m  BP Readings from Last 3 Encounters:  01/17/17 114/74  01/09/17 98/62  01/01/17 116/74    Wt Readings from Last 3 Encounters:  01/17/17 143 lb (64.9 kg)  01/09/17 146 lb 2 oz (66.3 kg)  01/01/17 141 lb (64 kg)    Physical Exam  Constitutional: She is oriented to person, place, and time. No distress.  HENT:  Right Ear: External ear normal.  Left Ear: External ear normal.  Nose: Nose normal. Right sinus exhibits no maxillary sinus tenderness and no frontal sinus tenderness. Left sinus exhibits no maxillary sinus tenderness and no frontal sinus tenderness.  Mouth/Throat: Oropharynx is clear and moist. No oropharyngeal exudate.  Eyes: EOM are normal. Pupils are equal, round, and reactive to light. Scleral icterus is present.   Neck: Normal range of motion. Neck supple.  Cardiovascular: Normal rate, regular rhythm and normal heart sounds.   Pulses:      Popliteal pulses are 2+ on the right side, and 2+ on the left side.       Dorsalis pedis pulses are 2+ on the right side, and 2+ on the left side.       Posterior tibial pulses are 2+ on the right side, and 2+ on the left side.  Pulmonary/Chest: Effort normal and breath sounds normal.  Musculoskeletal: She exhibits no edema.  Lymphadenopathy:    She has no cervical adenopathy.  Neurological: She is alert and oriented to person, place, and time.  Unsteady gait with rapid head movement  Skin: Skin is warm and dry. No erythema.  Vitals reviewed.   Lab Results  Component Value Date   WBC 10.9 (H) 10/27/2016   HGB 10.9 (L) 10/27/2016   HCT 32.4 (L) 10/27/2016   PLT 268 10/27/2016   GLUCOSE 88 11/20/2016   CHOL 201 (H) 11/20/2016   TRIG 277 (H) 11/20/2016   HDL 42 11/20/2016   LDLDIRECT 187.0 10/21/2016   LDLCALC 104 (H) 11/20/2016   ALT 12 11/20/2016   AST 9 11/20/2016   NA 139 11/20/2016   K 4.3 11/20/2016   CL 101 11/20/2016   CREATININE 0.83 11/20/2016   BUN 6 (L) 11/20/2016   CO2 23 11/20/2016   TSH 1.74 06/21/2016   INR 0.9 08/31/2008   HGBA1C 5.5 06/21/2016    US Abdomen Complete  Result Date: 01/14/2017 CLINICAL DATA:  Generalized abdominal pain with bloating EXAM: ABDOMEN ULTRASOUND COMPLETE COMPARISON:  None. FINDINGS: Gallbladder: Surgically absent. Common bile duct: Diameter: 10 mm, upper normal for post cholecystectomy state. No biliary duct mass or calculus evident. Liver: No focal lesion identified. Within normal limits in parenchymal echogenicity. IVC: No abnormality visualized. Pancreas: Visualized portion unremarkable. Portions of pancreas obscured by gas. Spleen: Size and appearance within normal limits. Right Kidney: Length: 8.8 cm. Echogenicity within normal limits. There is renal cortical thinning. No mass or hydronephrosis  visualized. Left Kidney: Length: 9.6 cm. Echogenicity within normal limits. There is renal cortical thinning. No mass or hydronephrosis visualized. Abdominal aorta: No aneurysm visualized. Other findings: No demonstrable ascites. IMPRESSION: Kidneys are somewhat small with renal cortical thinning bilaterally. Question a degree of underlying medical renal disease. No obstructing focus evident. Portions of pancreas obscured by gas. Visualized portions of pancreas appear normal. Gallbladder absent. Common bile duct upper normal in diameter for post cholecystectomy state. ) Electronically Signed   By: Lowella Grip III M.D.   On: 01/14/2017 09:21    Assessment & Plan:   Rini was seen today for follow-up.  Diagnoses and all orders for  this visit:  Vertigo -     meclizine (ANTIVERT) 12.5 MG tablet; Take 1 tablet (12.5 mg total) by mouth 2 (two) times daily as needed for dizziness. Only as needed for DIZZINESS  Moderate persistent asthma, uncomplicated -     dextromethorphan-guaiFENesin (MUCINEX DM) 30-600 MG 12hr tablet; Take 1 tablet by mouth 2 (two) times daily as needed for cough. -     benzonatate (TESSALON) 100 MG capsule; Take 1 capsule (100 mg total) by mouth 3 (three) times daily as needed for cough.  Episode of recurrent major depressive disorder, unspecified depression episode severity (HCC) -     buPROPion (WELLBUTRIN XL) 150 MG 24 hr tablet; Take 1 tablet (150 mg total) by mouth daily.  Cough  Spasm of lumbar paraspinous muscle -     cyclobenzaprine (FLEXERIL) 5 MG tablet; Take 1 tablet (5 mg total) by mouth 3 (three) times daily as needed for muscle spasms.   I have discontinued Ms. Mohammed's citalopram, doxycycline, gabapentin, VIRTUSSIN A/C, loperamide, and gabapentin. I have also changed her meclizine. Additionally, I am having her start on dextromethorphan-guaiFENesin, benzonatate, and buPROPion. Lastly, I am having her maintain her fluticasone, EPINEPHrine, simvastatin,  DULERA, valACYclovir, butalbital-acetaminophen-caffeine, albuterol, tiotropium, dexlansoprazole, nitroGLYCERIN, famotidine, fenofibrate, PREVNAR 13, traZODone, SHINGRIX, Fluticasone Furoate, montelukast, and cyclobenzaprine.  Meds ordered this encounter  Medications  . DISCONTD: gabapentin (NEURONTIN) 600 MG tablet    Sig: Take 600 mg by mouth 2 (two) times daily.  Marland Kitchen dextromethorphan-guaiFENesin (MUCINEX DM) 30-600 MG 12hr tablet    Sig: Take 1 tablet by mouth 2 (two) times daily as needed for cough.    Dispense:  14 tablet    Refill:  0    Order Specific Question:   Supervising Provider    Answer:   Cassandria Anger [[id number]]  . benzonatate (TESSALON) 100 MG capsule    Sig: Take 1 capsule (100 mg total) by mouth 3 (three) times daily as needed for cough.    Dispense:  20 capsule    Refill:  0    Order Specific Question:   Supervising Provider    Answer:   Cassandria Anger [[id number]]  . buPROPion (WELLBUTRIN XL) 150 MG 24 hr tablet    Sig: Take 1 tablet (150 mg total) by mouth daily.    Dispense:  30 tablet    Refill:  2    Order Specific Question:   Supervising Provider    Answer:   Cassandria Anger [[id number]]  . meclizine (ANTIVERT) 12.5 MG tablet    Sig: Take 1 tablet (12.5 mg total) by mouth 2 (two) times daily as needed for dizziness. Only as needed for DIZZINESS    Dispense:  30 tablet    Refill:  0    Order Specific Question:   Supervising Provider    Answer:   Cassandria Anger [[id number]]  . cyclobenzaprine (FLEXERIL) 5 MG tablet    Sig: Take 1 tablet (5 mg total) by mouth 3 (three) times daily as needed for muscle spasms.    Dispense:  30 tablet    Refill:  0    Order Specific Question:   Supervising Provider    Answer:   Cassandria Anger [[id number]]    Follow-up: Return in about 3 months (around 04/19/2017) for anxiety and depression.  Wilfred Lacy, NP

## 2017-01-17 NOTE — Progress Notes (Signed)
Pre visit review using our clinic review tool, if applicable. No additional management support is needed unless otherwise documented below in the visit note. 

## 2017-01-20 ENCOUNTER — Ambulatory Visit: Payer: Medicare Other | Admitting: Nurse Practitioner

## 2017-01-20 ENCOUNTER — Ambulatory Visit (INDEPENDENT_AMBULATORY_CARE_PROVIDER_SITE_OTHER): Payer: Medicare Other | Admitting: Cardiology

## 2017-01-20 ENCOUNTER — Ambulatory Visit (INDEPENDENT_AMBULATORY_CARE_PROVIDER_SITE_OTHER): Payer: Medicare Other

## 2017-01-20 ENCOUNTER — Encounter: Payer: Self-pay | Admitting: Cardiology

## 2017-01-20 VITALS — BP 104/80 | HR 88 | Ht 64.0 in | Wt 144.0 lb

## 2017-01-20 DIAGNOSIS — R0789 Other chest pain: Secondary | ICD-10-CM

## 2017-01-20 DIAGNOSIS — F17200 Nicotine dependence, unspecified, uncomplicated: Secondary | ICD-10-CM

## 2017-01-20 DIAGNOSIS — E785 Hyperlipidemia, unspecified: Secondary | ICD-10-CM | POA: Diagnosis not present

## 2017-01-20 DIAGNOSIS — R42 Dizziness and giddiness: Secondary | ICD-10-CM | POA: Insufficient documentation

## 2017-01-20 DIAGNOSIS — J309 Allergic rhinitis, unspecified: Secondary | ICD-10-CM | POA: Diagnosis not present

## 2017-01-20 NOTE — Progress Notes (Signed)
PCP: Flossie Buffy, NP  Clinic Note: Chief Complaint  Patient presents with  . Follow-up    follow up coronay ct  . Dizziness    HPI: Lori [name] is a 61 y.o. female who is being seen today for follow-up evaluation of chest pain evaluated with coronary CTA at the request of Nche, Charlene Brooke, NP. Lori [name] has history of COPD and has had multiple risk for chest pain since a negative cardiac evaluation in the past  Lori [name] was last seen on 11/20/2016  Recent Hospitalizations: None  Studies Personally Reviewed - if available, images/films reviewed: From Epic Chart or Care everywhere --   Coronary calcium score 0. No evidence of CAD  Interval History: Lori [name] returns today indicating that she really has not had any further chest discomfort with rest or exertion. Where she notes more significantly is having dizziness that seems to be more associated with turning her head one way or another or looking up or down. Not necessarily positional. She still has mild intermittent twinges along her sternal border as well as a under her left breast This is been persistent and not changed. Not associated with rest or exertion specifically. They continue to be very short-lived and made worse with deep inspiration or certain movements. She has her baseline exertional dyspnea. No further syncopal episodes just dizziness noted above.  No PND, orthopnea or edema.  No palpitations, syncope/near syncope. No TIA/amaurosis fugax symptoms. No claudication.  ROS: A comprehensive was performed. Review of Systems  Constitutional: Negative for malaise/fatigue.  HENT: Positive for congestion.   Respiratory: Positive for cough (Less persistent than before). Negative for shortness of breath and wheezing.   Gastrointestinal: Negative for blood in stool, heartburn and melena.  Genitourinary: Negative for hematuria.  Musculoskeletal: Positive for joint pain. Negative for falls (None since last  visit).  Neurological: Positive for dizziness (See history of present illness). Negative for focal weakness.  Endo/Heme/Allergies: Positive for environmental allergies.  Psychiatric/Behavioral: The patient is nervous/anxious.   All other systems reviewed and are negative.  I have reviewed and (if needed) personally updated the patient's problem list, medications, allergies, past medical and surgical history, social and family history.   Past Medical History:  Diagnosis Date  . Acute MI ([place])    x3 - by report only. She has had 3 negative Myoview stress test with no evidence of prior infarct.  . Allergic rhinitis   . Allergy   . Anemia   . Ankle fracture 05/2016  . Anxiety   . Arthritis   . Asthma   . Barrett esophagus 2007  . Chest pain 05-02-2009   echo  EF 55%  . COPD (chronic obstructive pulmonary disease) with chronic bronchitis ([facility])    & Emphysema  . Depression   . Duodenitis without mention of hemorrhage 2007  . Esophageal reflux 2007  . Esophageal stricture   . Full dentures   . H/O hiatal hernia   . Hiatal hernia 6295,2841  . Hyperlipidemia   . Multiple fractures    from falls, fx rt. elbow, fx left wrist, bilateral ankles  . Pneumonia 10/2016  . Ulcer     Past Surgical History:  Procedure Laterality Date  . ABDOMINAL HYSTERECTOMY     BSO  . APPENDECTOMY    . CESAREAN SECTION     x 2  . CHOLECYSTECTOMY    . CHONDROPLASTY Left 01/06/2015   Procedure: CHONDROPLASTY;  Surgeon: Marybelle Killings, MD;  Location: [facility];  Service: Orthopedics;  Laterality: Left;  . COLONOSCOPY    . ELBOW FRACTURE SURGERY     right  . KNEE ARTHROSCOPY WITH EXCISION PLICA Left 6/78/9381   Procedure: KNEE ARTHROSCOPY WITH EXCISION PLICA;  Surgeon: Marybelle Killings, MD;  Location: [facility];  Service: Orthopedics;  Laterality: Left;  . Lower Extremity Arterial Dopplers  07/06/2013   RABI 1.0, LABI 1.1.; No evidence of significant vascular atherogenic plaque   . NECK SURGERY    . NM MYOVIEW LTD  09/2014   LOW RISK. Normal EF of 65% with no regional wall motion abnormalities. No ischemia or infarction.  . TONSILLECTOMY    . WRIST FRACTURE SURGERY     left, has plate    Current Meds  Medication Sig  . albuterol (PROVENTIL HFA;VENTOLIN HFA) 108 (90 Base) MCG/ACT inhaler Inhale 2 puffs into the lungs every 6 (six) hours as needed for wheezing or shortness of breath.  . benzonatate (TESSALON) 100 MG capsule Take 1 capsule (100 mg total) by mouth 3 (three) times daily as needed for cough.  Marland Kitchen buPROPion (WELLBUTRIN XL) 150 MG 24 hr tablet Take 1 tablet (150 mg total) by mouth daily.  . butalbital-acetaminophen-caffeine (FIORICET, ESGIC) 50-325-40 MG tablet Take 1 tablet by mouth every 6 (six) hours as needed for headache.  . cyclobenzaprine (FLEXERIL) 5 MG tablet Take 1 tablet (5 mg total) by mouth 3 (three) times daily as needed for muscle spasms.  Marland Kitchen dexlansoprazole (DEXILANT) 60 MG capsule Take 1 capsule (60 mg total) by mouth daily.  Marland Kitchen dextromethorphan-guaiFENesin (MUCINEX DM) 30-600 MG 12hr tablet Take 1 tablet by mouth 2 (two) times daily as needed for cough.  . DULERA 200-5 MCG/ACT AERO Inhale 1 puff into the lungs 2 (two) times daily.  Marland Kitchen EPINEPHrine 0.3 mg/0.3 mL IJ SOAJ injection Use as directed for severe allergic reaction  . famotidine (PEPCID) 20 MG tablet TK 1 T PO BID  . fenofibrate (TRICOR) 145 MG tablet   . fluticasone (FLONASE) 50 MCG/ACT nasal spray Place 2 sprays into both nostrils daily.  . Fluticasone Furoate (ARNUITY ELLIPTA) 200 MCG/ACT AEPB Inhale 1 puff into the lungs daily.  . meclizine (ANTIVERT) 12.5 MG tablet Take 1 tablet (12.5 mg total) by mouth 2 (two) times daily as needed for dizziness. Only as needed for DIZZINESS  . montelukast (SINGULAIR) 10 MG tablet Take 1 tablet (10 mg total) by mouth at bedtime.  . nitroGLYCERIN (NITROSTAT) 0.4 MG SL tablet PLACE 1 TABLET UNDER THE TONGUE EVERY 5 MINUTES AS NEEDED FOR CHEST PAIN    . PREVNAR 13 SUSP injection   . SHINGRIX injection ADMINISTER AS DIRECTED  . simvastatin (ZOCOR) 20 MG tablet TAKE 1 TABLET BY MOUTH EVERY EVENING BEFORE BEDTIME FOR CHOLESTEROL  . tiotropium (SPIRIVA HANDIHALER) 18 MCG inhalation capsule Place 1 capsule (18 mcg total) into inhaler and inhale daily.  . traZODone (DESYREL) 50 MG tablet   . valACYclovir (VALTREX) 500 MG tablet Take 500 mg by mouth daily.    Allergies  Allergen Reactions  . Bee Venom Swelling  . Codeine Nausea Only    CAUSES ULCERS  . Ibuprofen Nausea And Vomiting  . Levofloxacin Nausea Only  . Propoxyphene N-Acetaminophen Nausea Only  . Toradol [Ketorolac Tromethamine] Nausea And Vomiting  . Ultram [Tramadol Hcl]   . Hydrocodone-Acetaminophen Nausea Only    REACTION: "sick"  . Latex Rash    Social History   Social History  . Marital status: Married    Spouse name:  [name]  . Number of children: 2  . Years of education: HS   Occupational History  . disable    Social History Main Topics  . Smoking status: Current Every Day Smoker    Packs/day: 0.50    Years: 39.00    Types: Cigarettes    Start date: 08/06/1977  . Smokeless tobacco: Never Used     Comment: Currently smoking 0.5ppd.  Quit once for 4 months.   . Alcohol use No  . Drug use: No  . Sexual activity: No   Other Topics Concern  . None   Social History Narrative   Patient lives at home spouse [name].  Has 2 daughters (67 & 75).   Currently "disabled".   Caffeine Use: tea 4 glasses daily.    Smokes ~1 1/2 PPD  05/15/15   Walks everyday - no set routine.      [facility] (12/03/16):   Originally from [facility]. 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 Currently has a dog. No bird exposure. No known mold exposure.     family history includes Dementia in her mother; Diabetes in her maternal aunt and maternal grandmother; Emphysema in her father; Heart disease in her brother.  Wt Readings from Last 3 Encounters:   01/20/17 144 lb (65.3 kg)  01/17/17 143 lb (64.9 kg)  01/09/17 146 lb 2 oz (66.3 kg)    PHYSICAL EXAM BP 104/80   Pulse 88   Ht 5\' 4"  (1.626 m)   Wt 144 lb (65.3 kg)   BMI 24.72 kg/m  General appearance: alert, cooperative, appears stated age, no distress and Well-nourished, well-groomed Neck: no adenopathy, no carotid bruit and no JVD Lungs: Nonlabored. Diffuse interstitial sounds with mild rhonchi and expiratory wheezing bilaterally. Otherwise clear to percussion. Heart: RRR with normal S1 and S2. No M/R/G. Nondisplaced PMI. Abdomen: soft, non-tender; bowel sounds normal; no masses,  no organomegaly; no HJR Extremities: extremities normal, atraumatic, no cyanosis, and edema trivial Pulses: 2+ and symmetric;  Neurologic: Mental status: Alert, oriented, thought content appropriate; Pleasant mood and affect    Adult ECG Report n/a  Other studies Reviewed: Additional studies/ records that were reviewed today include:  Recent Labs:  n/a    ASSESSMENT / PLAN: Problem List Items Addressed This Visit    Dizziness - Primary (Chronic)    Her dizziness symptoms don't really sound orthostatic, however with her blood pressure being as low as it is, she is at risk for orthostatic hypotension.  Recommendation: Adequate hydration. Avoid antihypertensives. Checking carotid/vertebral/subclavian artery Dopplers to exclude vertebrobasilar insufficiency-       Relevant Orders   VAS US CAROTID   Hyperlipidemia LDL goal <100 (Chronic)    Lab Results  Component Value Date   CHOL 201 (H) 11/20/2016   HDL 42 11/20/2016   LDLCALC 104 (H) 11/20/2016   LDLDIRECT 187.0 10/21/2016   TRIG 277 (H) 11/20/2016   CHOLHDL 4.8 (H) 11/20/2016   Elevated triglycerides, but with an LDL of 104 she is close to goal. I would be hesitant to put her on any more medications fenofibrate and simvastatin she is on - may want to consider switching from simvastatin to a different statin such as low-dose  rosuvastatin in conjunction with fenofibrate to avoid interaction/adverse reaction..  Also we discussed dietary modifications.      Musculoskeletal chest pain (Chronic)    She doesn't have any more of the other type of chest discomfort she was having before. She still has the costochondritis  musculoskeletal type chest discomfort that she's always had. It is notably improved with her prednisone taper. Coronary CTA did not show any evidence of CAD and the calcium score was 0. This would indicate that she is very low risk for her symptoms being cardiac in nature. As result, I think that she is fine following up with her PCP for management of her lipids and pulmonary issues.      Relevant Orders   VAS US CAROTID   Tobacco use disorder    I briefly talked about the importance of smoking cessation and how this plays a role in some of her other symptoms. She did not seem interested in quitting      Vertigo (Chronic)    With her dizziness, when I exclude cerebrovascular disease so we will order carotid Dopplers to look for vertebral and subclavian vessel flow in order to exclude potential vertebrobasilar insufficiency. If this is normal, I think she does not need to follow-up with me. If abnormal I will have her follow up with one of our vascular cardiologist.      Relevant Orders   VAS US CAROTID      Current medicines are reviewed at length with the patient today. (+/- concerns) n/a The following changes have been made: n/a  Patient Instructions  SCHEDULE AT [facility] has requested that you have a carotid duplex. This test is an ultrasound of the carotid arteries in your neck. It looks at blood flow through these arteries that supply the brain with blood. Allow one hour for this exam. There are no restrictions or special instructions. IF ABNORMAL WILL FOLLOW UP WITH DOCTOR,will contact you with result.    Your physician recommends that you schedule a  follow-up appointment on as needed as basis with Dr Ellyn Hack.    Studies Ordered:   No orders of the defined types were placed in this encounter.     Glenetta Hew, M.D., M.S. Interventional Cardiologist   Pager # [phone] Phone # [phone] [address] [place], [place] [id number]

## 2017-01-20 NOTE — Patient Instructions (Signed)
SCHEDULE AT [facility] Your physician has requested that you have a carotid duplex. This test is an ultrasound of the carotid arteries in your neck. It looks at blood flow through these arteries that supply the brain with blood. Allow one hour for this exam. There are no restrictions or special instructions. IF ABNORMAL WILL FOLLOW UP WITH DOCTOR,will contact you with result.    Your physician recommends that you schedule a follow-up appointment on as needed as basis with Dr Ellyn Hack.

## 2017-01-22 ENCOUNTER — Encounter: Payer: Self-pay | Admitting: Cardiology

## 2017-01-22 NOTE — Assessment & Plan Note (Signed)
Her dizziness symptoms don't really sound orthostatic, however with her blood pressure being as low as it is, she is at risk for orthostatic hypotension.  Recommendation: Adequate hydration. Avoid antihypertensives. Checking carotid/vertebral/subclavian artery Dopplers to exclude vertebrobasilar insufficiency-

## 2017-01-22 NOTE — Assessment & Plan Note (Signed)
With her dizziness, when I exclude cerebrovascular disease so we will order carotid Dopplers to look for vertebral and subclavian vessel flow in order to exclude potential vertebrobasilar insufficiency. If this is normal, I think she does not need to follow-up with me. If abnormal I will have her follow up with one of our vascular cardiologist.

## 2017-01-22 NOTE — Assessment & Plan Note (Signed)
Lab Results  Component Value Date   CHOL 201 (H) 11/20/2016   HDL 42 11/20/2016   LDLCALC 104 (H) 11/20/2016   LDLDIRECT 187.0 10/21/2016   TRIG 277 (H) 11/20/2016   CHOLHDL 4.8 (H) 11/20/2016   Elevated triglycerides, but with an LDL of 104 she is close to goal. I would be hesitant to put her on any more medications fenofibrate and simvastatin she is on - may want to consider switching from simvastatin to a different statin such as low-dose rosuvastatin in conjunction with fenofibrate to avoid interaction/adverse reaction..  Also we discussed dietary modifications.

## 2017-01-22 NOTE — Assessment & Plan Note (Signed)
I briefly talked about the importance of smoking cessation and how this plays a role in some of her other symptoms. She did not seem interested in quitting

## 2017-01-22 NOTE — Assessment & Plan Note (Signed)
She doesn't have any more of the other type of chest discomfort she was having before. She still has the costochondritis musculoskeletal type chest discomfort that she's always had. It is notably improved with her prednisone taper. Coronary CTA did not show any evidence of CAD and the calcium score was 0. This would indicate that she is very low risk for her symptoms being cardiac in nature. As result, I think that she is fine following up with her PCP for management of her lipids and pulmonary issues.

## 2017-01-27 ENCOUNTER — Telehealth: Payer: Self-pay | Admitting: Nurse Practitioner

## 2017-01-27 NOTE — Telephone Encounter (Signed)
Spoke with pt, she said that cough med and mucinex doesn't help her, she still has chest congestion and coughing. Please advise.

## 2017-01-27 NOTE — Telephone Encounter (Signed)
She will have to follow up with pulmonology about cough. mucinex DM or benzonatate are the only recommendations I have.

## 2017-01-27 NOTE — Telephone Encounter (Signed)
Pt called in and said that the pill that she was given is not helping.  She would like to see if Baldo Ash could can in some cough meds for her   Pharmacy [facility] on [address]

## 2017-01-28 NOTE — Telephone Encounter (Signed)
Pt verbalized understand and already have an appt set up with pulmonary on 01/31/2017.

## 2017-01-31 ENCOUNTER — Ambulatory Visit (INDEPENDENT_AMBULATORY_CARE_PROVIDER_SITE_OTHER): Payer: Medicare Other

## 2017-01-31 ENCOUNTER — Ambulatory Visit (INDEPENDENT_AMBULATORY_CARE_PROVIDER_SITE_OTHER): Payer: Medicare Other | Admitting: *Deleted

## 2017-01-31 ENCOUNTER — Encounter: Payer: Self-pay | Admitting: Pulmonary Disease

## 2017-01-31 ENCOUNTER — Ambulatory Visit (INDEPENDENT_AMBULATORY_CARE_PROVIDER_SITE_OTHER): Payer: Medicare Other | Admitting: Pulmonary Disease

## 2017-01-31 ENCOUNTER — Ambulatory Visit (INDEPENDENT_AMBULATORY_CARE_PROVIDER_SITE_OTHER)
Admission: RE | Admit: 2017-01-31 | Discharge: 2017-01-31 | Disposition: A | Discharge status: Home / Self Care | Payer: Medicare Other | Source: Ambulatory Visit | Attending: Pulmonary Disease | Admitting: Pulmonary Disease

## 2017-01-31 VITALS — BP 102/74 | HR 86 | Ht 64.0 in | Wt 146.8 lb

## 2017-01-31 DIAGNOSIS — F17210 Nicotine dependence, cigarettes, uncomplicated: Secondary | ICD-10-CM | POA: Diagnosis not present

## 2017-01-31 DIAGNOSIS — J439 Emphysema, unspecified: Secondary | ICD-10-CM

## 2017-01-31 DIAGNOSIS — S82309A Unspecified fracture of lower end of unspecified tibia, initial encounter for closed fracture: Secondary | ICD-10-CM | POA: Diagnosis not present

## 2017-01-31 DIAGNOSIS — J441 Chronic obstructive pulmonary disease with (acute) exacerbation: Secondary | ICD-10-CM

## 2017-01-31 DIAGNOSIS — R05 Cough: Secondary | ICD-10-CM | POA: Diagnosis not present

## 2017-01-31 DIAGNOSIS — J309 Allergic rhinitis, unspecified: Secondary | ICD-10-CM

## 2017-01-31 DIAGNOSIS — J438 Other emphysema: Secondary | ICD-10-CM | POA: Diagnosis not present

## 2017-01-31 DIAGNOSIS — J302 Other seasonal allergic rhinitis: Secondary | ICD-10-CM

## 2017-01-31 DIAGNOSIS — J449 Chronic obstructive pulmonary disease, unspecified: Secondary | ICD-10-CM | POA: Insufficient documentation

## 2017-01-31 DIAGNOSIS — S62156A Nondisplaced fracture of hook process of hamate [unciform] bone, unspecified wrist, initial encounter for closed fracture: Secondary | ICD-10-CM | POA: Diagnosis not present

## 2017-01-31 DIAGNOSIS — K219 Gastro-esophageal reflux disease without esophagitis: Secondary | ICD-10-CM

## 2017-01-31 DIAGNOSIS — J189 Pneumonia, unspecified organism: Secondary | ICD-10-CM | POA: Diagnosis not present

## 2017-01-31 DIAGNOSIS — F17200 Nicotine dependence, unspecified, uncomplicated: Secondary | ICD-10-CM

## 2017-01-31 LAB — PULMONARY FUNCTION TEST
FEF 25-75 Post: 0.56 L/s
FEF 25-75 Pre: 0.76 L/s
FEF2575-%Change-Post: -25 %
FEF2575-%Pred-Post: 24 %
FEF2575-%Pred-Pre: 32 %
FEV1-%Change-Post: -5 %
FEV1-%Pred-Post: 48 %
FEV1-%Pred-Pre: 51 %
FEV1-Post: 1.23 L
FEV1-Pre: 1.31 L
FEV1FVC-%Change-Post: 8 %
FEV1FVC-%Pred-Pre: 85 %
FEV6-%Change-Post: -11 %
FEV6-%Pred-Post: 53 %
FEV6-%Pred-Pre: 60 %
FEV6-Post: 1.7 L
FEV6-Pre: 1.92 L
FEV6FVC-%Change-Post: 1 %
FEV6FVC-%Pred-Post: 103 %
FEV6FVC-%Pred-Pre: 101 %
FVC-%Change-Post: -13 %
FVC-%Pred-Post: 51 %
FVC-%Pred-Pre: 59 %
FVC-Post: 1.7 L
FVC-Pre: 1.96 L
Post FEV1/FVC ratio: 72 %
Post FEV6/FVC ratio: 100 %
Pre FEV1/FVC ratio: 67 %
Pre FEV6/FVC Ratio: 99 %

## 2017-01-31 MED ORDER — PREDNISONE 10 MG PO TABS
ORAL_TABLET | ORAL | 0 refills | Status: DC
Start: 2017-01-31 — End: 2017-02-05

## 2017-01-31 MED ORDER — BUDESONIDE-FORMOTEROL FUMARATE 80-4.5 MCG/ACT IN AERO: 2.0000 | INHALATION_SPRAY | Freq: Two times a day (BID) | RESPIRATORY_TRACT | 6 refills | Status: DC

## 2017-01-31 MED ORDER — RANITIDINE HCL 150 MG PO TABS: 150.0000 mg | ORAL_TABLET | Freq: Every day | ORAL | 11 refills | Status: DC

## 2017-01-31 MED ORDER — AEROCHAMBER MV MISC: 0 refills | Status: DC

## 2017-01-31 MED ORDER — BUDESONIDE-FORMOTEROL FUMARATE 80-4.5 MCG/ACT IN AERO: 2.0000 | INHALATION_SPRAY | Freq: Two times a day (BID) | RESPIRATORY_TRACT | 0 refills | Status: DC

## 2017-01-31 MED ORDER — AZITHROMYCIN 250 MG PO TABS: ORAL_TABLET | ORAL | 5 refills | Status: DC

## 2017-01-31 NOTE — Patient Instructions (Addendum)
   I'm starting you on Symbicort to help with your breathing & coughing. You can keep using your ProAir/Albuterol inhaler as needed.  Use the Symbicort inhaler with your spacer by doing 2 puffs twice daily every day.   Remember to remove any dentures or partials you have before you use your inhaler. Remember to brush your teeth & tongue after you use your inhaler as well as rinse, gargle & spit to keep from getting thrush in your mouth or on your tongue (a white film).   Use your Albuterol inhaler every 4 hours regularly while you are feeling sick.  I am starting you on a Prednisone taper:  6 pills daily for 3 days, then  5 pills daily for 3 days, then   4 pills daily for 3 days, then  3 pills daily for 3 days, then  2 pills daily for 3 days, then  1 pill daily for 3 days and stop  We will see you back next week to make sure you are getting better.   If you're not getting better please let us know.   TESTS ORDERED: 1. CXR PA/LAT TODAY

## 2017-01-31 NOTE — Progress Notes (Signed)
Subjective:    Patient ID: Lori [name], female    DOB: Jul 21, 1956, 61 y.o.   MRN: [id number]  C.C.:  Follow-up for COPD Mixed Type w/ Emphysema, Chronic Seasonal Allergic Rhinitis, GERD & Tobacco Use Disorder.   HPI COPD Mixed Type w/ Emphysema: Currently on albuterol rescue inhaler only. Spirometry does show fixed airway obstruction. She reports for the last 1-2 weeks she has had increased coughing productive of a yellow phlegm. No sick contacts. She is using her ProAir twice daily but has remained off the [place] and Spiriva. She is waking up at night coughing.   Chronic Seasonal Allergic Rhinitis: Currently on Singulair. Following with allergy/immunology and currently undergoing immunotherapy. She reports some increased sinus congestion & drainage.   GERD: Zantac added to patient's regimen of Dexilant at last appointment. No reflux or dyspepsia. No morning brash water taste.   Tobacco Use Disorder:  She is smoking less than 1ppd. She is cutting back. Her husband is also smoking at home. She is using a patch.   Review of Systems No fever, chills, or sweats. She reports chest discomfort with her coughing. No other chest pressure. No abdominal pain, nausea or emesis.   Allergies  Allergen Reactions  . Bee Venom Swelling  . Codeine Nausea Only    CAUSES ULCERS  . Ibuprofen Nausea And Vomiting  . Levofloxacin Nausea Only  . Propoxyphene N-Acetaminophen Nausea Only  . Toradol [Ketorolac Tromethamine] Nausea And Vomiting  . Ultram [Tramadol Hcl]   . Hydrocodone-Acetaminophen Nausea Only    REACTION: "sick"  . Latex Rash    Current Outpatient Prescriptions on File Prior to Visit  Medication Sig Dispense Refill  . albuterol (PROVENTIL HFA;VENTOLIN HFA) 108 (90 Base) MCG/ACT inhaler Inhale 2 puffs into the lungs every 6 (six) hours as needed for wheezing or shortness of breath. 1 Inhaler 3  . buPROPion (WELLBUTRIN XL) 150 MG 24 hr tablet Take 1 tablet (150 mg total) by mouth daily.  30 tablet 2  . butalbital-acetaminophen-caffeine (FIORICET, ESGIC) 50-325-40 MG tablet Take 1 tablet by mouth every 6 (six) hours as needed for headache.    . cyclobenzaprine (FLEXERIL) 5 MG tablet Take 1 tablet (5 mg total) by mouth 3 (three) times daily as needed for muscle spasms. 30 tablet 0  . dexlansoprazole (DEXILANT) 60 MG capsule Take 1 capsule (60 mg total) by mouth daily. 30 capsule 6  . dextromethorphan-guaiFENesin (MUCINEX DM) 30-600 MG 12hr tablet Take 1 tablet by mouth 2 (two) times daily as needed for cough. 14 tablet 0  . DULERA 200-5 MCG/ACT AERO Inhale 1 puff into the lungs 2 (two) times daily.    Marland Kitchen EPINEPHrine 0.3 mg/0.3 mL IJ SOAJ injection Use as directed for severe allergic reaction 2 Device 1  . famotidine (PEPCID) 20 MG tablet TK 1 T PO BID  0  . fenofibrate (TRICOR) 145 MG tablet     . fluticasone (FLONASE) 50 MCG/ACT nasal spray Place 2 sprays into both nostrils daily. 16 g 0  . Fluticasone Furoate (ARNUITY ELLIPTA) 200 MCG/ACT AEPB Inhale 1 puff into the lungs daily. 30 each 5  . meclizine (ANTIVERT) 12.5 MG tablet Take 1 tablet (12.5 mg total) by mouth 2 (two) times daily as needed for dizziness. Only as needed for DIZZINESS 30 tablet 0  . montelukast (SINGULAIR) 10 MG tablet Take 1 tablet (10 mg total) by mouth at bedtime. 30 tablet 5  . nitroGLYCERIN (NITROSTAT) 0.4 MG SL tablet PLACE 1 TABLET UNDER THE TONGUE EVERY 5  MINUTES AS NEEDED FOR CHEST PAIN 25 tablet 1  . PREVNAR 13 SUSP injection     . SHINGRIX injection ADMINISTER AS DIRECTED  0  . simvastatin (ZOCOR) 20 MG tablet TAKE 1 TABLET BY MOUTH EVERY EVENING BEFORE BEDTIME FOR CHOLESTEROL 30 tablet 4  . tiotropium (SPIRIVA HANDIHALER) 18 MCG inhalation capsule Place 1 capsule (18 mcg total) into inhaler and inhale daily. 30 capsule 3  . traZODone (DESYREL) 50 MG tablet   2  . valACYclovir (VALTREX) 500 MG tablet Take 500 mg by mouth daily.    . benzonatate (TESSALON) 100 MG capsule Take 1 capsule (100 mg total)  by mouth 3 (three) times daily as needed for cough. (Patient not taking: Reported on 01/31/2017) 20 capsule 0   No current facility-administered medications on file prior to visit.     Past Medical History:  Diagnosis Date  . Acute MI ([place])    x3 - by report only. She has had 3 negative Myoview stress test with no evidence of prior infarct.  . Allergic rhinitis   . Allergy   . Anemia   . Ankle fracture 05/2016  . Anxiety   . Arthritis   . Asthma   . Barrett esophagus 2007  . Chest pain 05-02-2009   echo  EF 55%  . COPD (chronic obstructive pulmonary disease) with chronic bronchitis (HCC)    & Emphysema  . Depression   . Duodenitis without mention of hemorrhage 2007  . Esophageal reflux 2007  . Esophageal stricture   . Full dentures   . H/O hiatal hernia   . Hiatal hernia [id number]  . Hyperlipidemia   . Multiple fractures    from falls, fx rt. elbow, fx left wrist, bilateral ankles  . Pneumonia 10/2016  . Ulcer     Past Surgical History:  Procedure Laterality Date  . ABDOMINAL HYSTERECTOMY     BSO  . APPENDECTOMY    . CESAREAN SECTION     x 2  . CHOLECYSTECTOMY    . CHONDROPLASTY Left 01/06/2015   Procedure: CHONDROPLASTY;  Surgeon: Marybelle Killings, MD;  Location: [place] [facility];  Service: Orthopedics;  Laterality: Left;  . COLONOSCOPY    . ELBOW FRACTURE SURGERY     right  . KNEE ARTHROSCOPY WITH EXCISION PLICA Left 3/81/0175   Procedure: KNEE ARTHROSCOPY WITH EXCISION PLICA;  Surgeon: Marybelle Killings, MD;  Location: [place];  Service: Orthopedics;  Laterality: Left;  . Lower Extremity Arterial Dopplers  07/06/2013   RABI 1.0, LABI 1.1.; No evidence of significant vascular atherogenic plaque  . NECK SURGERY    . NM MYOVIEW LTD  09/2014   LOW RISK. Normal EF of 65% with no regional wall motion abnormalities. No ischemia or infarction.  . TONSILLECTOMY    . WRIST FRACTURE SURGERY     left, has plate    Family History  Problem Relation  Age of Onset  . Emphysema Father   . Dementia Mother   . Diabetes Maternal Grandmother   . Diabetes Maternal Aunt   . Diabetes Unknown        mat. cousin  . Heart disease Brother   . Colon cancer Neg Hx   . Allergic rhinitis Neg Hx   . Angioedema Neg Hx   . Asthma Neg Hx   . Atopy Neg Hx   . Eczema Neg Hx   . Immunodeficiency Neg Hx   . Urticaria Neg Hx     Social History  Social History  . Marital status: Married    Spouse name: [name]  . Number of children: 2  . Years of education: HS   Occupational History  . disable    Social History Main Topics  . Smoking status: Current Every Day Smoker    Packs/day: 0.50    Years: 39.00    Types: Cigarettes    Start date: 08/06/1977  . Smokeless tobacco: Never Used     Comment: Currently smoking 0.5ppd.  Quit once for 4 months.   . Alcohol use No  . Drug use: No  . Sexual activity: No   Other Topics Concern  . None   Social History Narrative   Patient lives at home spouse [name].  Has 2 daughters (28 & 44).   Currently "disabled".   Caffeine Use: tea 4 glasses daily.    Smokes ~1 1/2 PPD  05/15/15   Walks everyday - no set routine.      [facility] (12/03/16):   Originally from [facility]. 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 No bird exposure. No known mold exposure.       Objective:   Physical Exam BP 102/74 (BP Location: Left Arm, Patient Position: Sitting, Cuff Size: Normal)   Pulse 86   Ht 5\' 4"  (1.626 m)   Wt 146 lb 12.8 oz (66.6 kg)   SpO2 100%   BMI 25.20 kg/m   Gen.: Awake. No distress. Accompanied by friend and husband today. Integument: Warm. Dry. No rash. Extremities: No cyanosis or clubbing. Pulmonary: Decreased aeration bilateral lung bases with mild end expiratory wheeze. Intermittent nonproductive cough witnessed. Mildly increased work of breathing on room air. Abdomen: Soft. Protuberant. Normal bowel sounds. Cardiovascular: Regular rate. Regular  rhythm. No edema. Neurological: Cranial nerves grossly intact. No meningismus. Oriented 4.  PFT 01/31/17: FVC 1.96 L (59%) FEV1 1.31 L (51%) FEV1/FVC 0.67 FEF 25-75 0.76 L (32%) negative bronchodilator response 09/26/16: FVC 2.37 L (86%) FEV1 1.73 L (77%) FEV1/FVC 0.72 FEF 25-75 1.27 L (54%) 06/19/16: FVC 1.77 L (61%) FEV1 1.26 L (54%) FEV1/FVC 0.71 FEF 25-75 1.01 L (41%) negative bronchodilator response  6MWT 01/31/17:  Walked 340 meters / Baseline Sat 96% on RA / Nadir Sat 95% on RA  IMAGING CT CORONARY ANGIOGRAPHY 12/02/16 (previously reviewed by me): Per the radiologist patient's coronary calcium score is 0. Right dominant with normal coronary origin. No evidence of coronary artery disease. Reviewing the lung parenchyma shows some mild dependent atelectasis but no evidence of pleural thickening or pleural effusion on limited views. No pathologically enlarged mediastinal lymph nodes on limited views either. Apical predominant emphysematous changes are present.  CXR PA/LAT 11/23/16 (previously reviewed by me): No parenchymal mass or opacity appreciated. No pleural effusion. Heart normal in size & mediastinum normal in contour.  LABS 12/03/16 Alpha-1 antitrypsin: MM (173)  09/26/16 IgG: 524 IgA: 88 IgM: 72    Assessment & Plan:  61 y.o. female with tobacco use disorder along with COPD mixed type with emphysema, chronic seasonal allergic rhinitis, & GERD. Patient's pulmonary function testing has significantly worsened since January off inhaler therapy. She does appear to have moderate-severe airway obstruction on spirometry today which would be consistent with an overlap syndrome given her clinical picture. I do believe she would benefit from inhaled corticosteroid therapy. Given the change in her sputum I feel that treatment with an anti-inflammatory antibiotic may benefit the patient today as well. Given the severity of her symptoms I am starting  a prolonged prednisone taper. She will need  close follow-up to ensure she is clinically improving. I instructed the patient contacted our office if she failed to clinically improve with the changes we're making today.  1. COPD mixed type with emphysema With exacerbation: Checking chest x-ray PA/LAT today. Empiric treatment with a Z-Pak as well as prednisone taper. Restarting patient on Symbicort 80/4.5 with spacer. Sample of Symbicort provided today. Patient instructed to use Mucinex twice daily and albuterol inhaler every 4 hours while she is sick. 2. Chronic seasonal allergic rhinitis:  Controlled with Singulair and immunotherapy. Following with allergy/immunology. 3. GERD:  Controlled with Dexilant and Zantac. No changes. Zantac refills provided. 4. Tobacco use disorder: Patient counseled for over 3 minutes on the need for complete tobacco cessation. Continuing to taper cigarette use. 5. Health maintenance: Status post Pneumovax May 2015 & Tdap August 2017. 6. Follow-up: Return to clinic in 1 week to be seen by available provider.  Sonia Baller Ashok Cordia, M.D. [facility] Pager:  [phone] After 3pm or if no response, call [phone] 1:54 PM 01/31/17

## 2017-01-31 NOTE — Addendum Note (Signed)
Addended by: Tyson Dense on: 01/31/2017 04:07 PM   Modules accepted: Orders

## 2017-01-31 NOTE — Progress Notes (Signed)
Patient seen in the office today and instructed on use of Symbicort 80 and spacer.  Patient expressed understanding and demonstrated technique.

## 2017-01-31 NOTE — Progress Notes (Signed)
SIX MIN WALK 01/31/2017  Medications no medications have been taken today  Supplimental Oxygen during Test? (L/min) No  Laps 7  Partial Lap (in Meters) 4  Baseline BP (sitting) 126/74  Baseline Heartrate 92  Baseline Dyspnea (Borg Scale) 5  Baseline Fatigue (Borg Scale) 10  Baseline SPO2 96  BP (sitting) 124/70  Heartrate 118  Dyspnea (Borg Scale) 5  Fatigue (Borg Scale) 10  SPO2 95  BP (sitting) 122/68  Heartrate 98  SPO2 98  Stopped or Paused before Six Minutes No  Interpretation Leg pain  Distance Completed 340  Provider Comments: test performed with forehead probe. pt completed test at moderate pace with c/o's of knee pain due to a previous surgery. no desats

## 2017-01-31 NOTE — Progress Notes (Signed)
PFT done today. 

## 2017-02-03 NOTE — Progress Notes (Signed)
Spoke with patient and informed her of results. Pt verbalized understanding and did not have any questions. Nothing further is needed.

## 2017-02-05 ENCOUNTER — Ambulatory Visit (INDEPENDENT_AMBULATORY_CARE_PROVIDER_SITE_OTHER): Payer: Medicare Other | Admitting: Pulmonary Disease

## 2017-02-05 ENCOUNTER — Encounter: Payer: Self-pay | Admitting: Pulmonary Disease

## 2017-02-05 ENCOUNTER — Ambulatory Visit (INDEPENDENT_AMBULATORY_CARE_PROVIDER_SITE_OTHER): Payer: Medicare Other | Admitting: *Deleted

## 2017-02-05 VITALS — BP 118/76 | HR 98 | Ht 64.0 in | Wt 142.0 lb

## 2017-02-05 DIAGNOSIS — J309 Allergic rhinitis, unspecified: Secondary | ICD-10-CM

## 2017-02-05 DIAGNOSIS — F17210 Nicotine dependence, cigarettes, uncomplicated: Secondary | ICD-10-CM

## 2017-02-05 DIAGNOSIS — R0602 Shortness of breath: Secondary | ICD-10-CM

## 2017-02-05 LAB — NITRIC OXIDE: Nitric Oxide: 9

## 2017-02-05 MED ORDER — PREDNISONE 10 MG PO TABS: ORAL_TABLET | ORAL | 0 refills | Status: DC

## 2017-02-05 MED ORDER — FLUTTER DEVI: 1.0000 | Freq: Every day | 0 refills | Status: DC

## 2017-02-05 MED ORDER — BENZONATATE 100 MG PO CAPS: 100.0000 mg | ORAL_CAPSULE | Freq: Three times a day (TID) | ORAL | 1 refills | Status: DC | PRN

## 2017-02-05 MED ORDER — AZITHROMYCIN 250 MG PO TABS: ORAL_TABLET | ORAL | 0 refills | Status: AC

## 2017-02-05 NOTE — Patient Instructions (Addendum)
We will reorder your Z-Pak and prednisone. Make sure that you call the pharmacy to get this filled. Continue Symbicort, Mucinex. Start flutter valve Will order Tessalon 100 mg 3 times a day as needed  Return to clinic in 3-4 weeks

## 2017-02-05 NOTE — Progress Notes (Signed)
Lori [name]    [id number]    1956/03/06  Primary Care Physician:Nche, Lori Brooke, NP  Referring Physician: Flossie Buffy, NP Lori [name], Lori [name] [id number]  Chief complaint:  Follow-up for COPD, asthma  HPI: Lori [name] is a 61 year old with past medical history of COPD, asthma. She was seen by Dr. Ashok [name] on 5/18 for worsening dyspnea, exacerbation. She was given a mucinex, Z-Pak, prednisone taper and restarted on Symbicort 80/4.5. She apparently did not get the prednisone, Z-Pak but is using the Symbicort. She is here for follow-up to monitor her response  She reports unchanged symptoms of cough with yellow sputum, dyspnea, wheezing, chest congestion. Denies any fevers, chills, hemoptysis. She still smoking.  Outpatient Encounter Prescriptions as of 02/05/2017  Medication Sig  . albuterol (PROVENTIL HFA;VENTOLIN HFA) 108 (90 Base) MCG/ACT inhaler Inhale 2 puffs into the lungs every 6 (six) hours as needed for wheezing or shortness of breath.  . benzonatate (TESSALON) 100 MG capsule Take 1 capsule (100 mg total) by mouth 3 (three) times daily as needed for cough.  . budesonide-formoterol (SYMBICORT) 80-4.5 MCG/ACT inhaler Inhale 2 puffs into the lungs 2 (two) times daily.  Marland Kitchen buPROPion (WELLBUTRIN XL) 150 MG 24 hr tablet Take 1 tablet (150 mg total) by mouth daily.  . butalbital-acetaminophen-caffeine (FIORICET, ESGIC) 50-325-40 MG tablet Take 1 tablet by mouth every 6 (six) hours as needed for headache.  . cyclobenzaprine (FLEXERIL) 5 MG tablet Take 1 tablet (5 mg total) by mouth 3 (three) times daily as needed for muscle spasms.  Marland Kitchen dexlansoprazole (DEXILANT) 60 MG capsule Take 1 capsule (60 mg total) by mouth daily.  Marland Kitchen dextromethorphan-guaiFENesin (MUCINEX DM) 30-600 MG 12hr tablet Take 1 tablet by mouth 2 (two) times daily as needed for cough.  . EPINEPHrine 0.3 mg/0.3 mL IJ SOAJ injection Use as directed for severe allergic reaction  . famotidine  (PEPCID) 20 MG tablet TK 1 T PO BID  . fenofibrate (TRICOR) 145 MG tablet   . fluticasone (FLONASE) 50 MCG/ACT nasal spray Place 2 sprays into both nostrils daily.  . meclizine (ANTIVERT) 12.5 MG tablet Take 1 tablet (12.5 mg total) by mouth 2 (two) times daily as needed for dizziness. Only as needed for DIZZINESS  . montelukast (SINGULAIR) 10 MG tablet Take 1 tablet (10 mg total) by mouth at bedtime.  . nitroGLYCERIN (NITROSTAT) 0.4 MG SL tablet PLACE 1 TABLET UNDER THE TONGUE EVERY 5 MINUTES AS NEEDED FOR CHEST PAIN  . PREVNAR 13 SUSP injection   . ranitidine (ZANTAC) 150 MG tablet Take 1 tablet (150 mg total) by mouth at bedtime.  Marland Kitchen SHINGRIX injection ADMINISTER AS DIRECTED  . simvastatin (ZOCOR) 20 MG tablet TAKE 1 TABLET BY MOUTH EVERY EVENING BEFORE BEDTIME FOR CHOLESTEROL  . Spacer/Aero-Holding Chambers (AEROCHAMBER MV) inhaler Use as instructed  . traZODone (DESYREL) 50 MG tablet   . valACYclovir (VALTREX) 500 MG tablet Take 500 mg by mouth daily.  . [DISCONTINUED] azithromycin (ZITHROMAX Z-PAK) 250 MG tablet Take two tablets today, then one tablet daily until gone  . [DISCONTINUED] budesonide-formoterol (SYMBICORT) 80-4.5 MCG/ACT inhaler Inhale 2 puffs into the lungs 2 (two) times daily.  . [DISCONTINUED] DULERA 200-5 MCG/ACT AERO Inhale 1 puff into the lungs 2 (two) times daily.  . [DISCONTINUED] Fluticasone Furoate (ARNUITY ELLIPTA) 200 MCG/ACT AEPB Inhale 1 puff into the lungs daily.  . [DISCONTINUED] predniSONE (DELTASONE) 10 MG tablet Please refer to patient check out sheet for directions.  . [  DISCONTINUED] tiotropium (SPIRIVA HANDIHALER) 18 MCG inhalation capsule Place 1 capsule (18 mcg total) into inhaler and inhale daily.   No facility-administered encounter medications on file as of 02/05/2017.     Allergies as of 02/05/2017 - Review Complete 02/05/2017  Allergen Reaction Noted  . Bee venom Swelling 07/27/2013  . Codeine Nausea Only 11/18/2008  . Ibuprofen Nausea And  Vomiting 07/20/2014  . Levofloxacin Nausea Only 08/01/2016  . Propoxyphene n-acetaminophen Nausea Only 11/18/2008  . Toradol [ketorolac tromethamine] Nausea And Vomiting 04/08/2013  . Ultram [tramadol hcl]  12/17/2016  . Hydrocodone-acetaminophen Nausea Only 10/26/2009  . Latex Rash 02/26/2013    Past Medical History:  Diagnosis Date  . Acute MI ([place])    x3 - by report only. She has had 3 negative Myoview stress test with no evidence of prior infarct.  . Allergic rhinitis   . Allergy   . Anemia   . Ankle fracture 05/2016  . Anxiety   . Arthritis   . Asthma   . Barrett esophagus 2007  . Chest pain 05-02-2009   echo  EF 55%  . COPD (chronic obstructive pulmonary disease) with chronic bronchitis (HCC)    & Emphysema  . Depression   . Duodenitis without mention of hemorrhage 2007  . Esophageal reflux 2007  . Esophageal stricture   . Full dentures   . H/O hiatal hernia   . Hiatal hernia [id number]  . Hyperlipidemia   . Multiple fractures    from falls, fx rt. elbow, fx left wrist, bilateral ankles  . Pneumonia 10/2016  . Ulcer     Past Surgical History:  Procedure Laterality Date  . ABDOMINAL HYSTERECTOMY     BSO  . APPENDECTOMY    . CESAREAN SECTION     x 2  . CHOLECYSTECTOMY    . CHONDROPLASTY Left 01/06/2015   Procedure: CHONDROPLASTY;  Surgeon: Marybelle Killings, MD;  Location: [place] [facility];  Service: Orthopedics;  Laterality: Left;  . COLONOSCOPY    . ELBOW FRACTURE SURGERY     right  . KNEE ARTHROSCOPY WITH EXCISION PLICA Left 06/05/1659   Procedure: KNEE ARTHROSCOPY WITH EXCISION PLICA;  Surgeon: Marybelle Killings, MD;  Location: [place];  Service: Orthopedics;  Laterality: Left;  . Lower Extremity Arterial Dopplers  07/06/2013   RABI 1.0, LABI 1.1.; No evidence of significant vascular atherogenic plaque  . NECK SURGERY    . NM MYOVIEW LTD  09/2014   LOW RISK. Normal EF of 65% with no regional wall motion abnormalities. No ischemia or  infarction.  . TONSILLECTOMY    . WRIST FRACTURE SURGERY     left, has plate    Family History  Problem Relation Age of Onset  . Emphysema Father   . Dementia Mother   . Diabetes Maternal Grandmother   . Diabetes Maternal Aunt   . Diabetes Unknown        mat. cousin  . Heart disease Brother   . Colon cancer Neg Hx   . Allergic rhinitis Neg Hx   . Angioedema Neg Hx   . Asthma Neg Hx   . Atopy Neg Hx   . Eczema Neg Hx   . Immunodeficiency Neg Hx   . Urticaria Neg Hx     Social History   Social History  . Marital status: Married    Spouse name: [name]  . Number of children: 2  . Years of education: HS   Occupational History  . disable  Social History Main Topics  . Smoking status: Current Every Day Smoker    Packs/day: 0.50    Years: 39.00    Types: Cigarettes    Start date: 08/06/1977  . Smokeless tobacco: Never Used     Comment: Currently smoking 0.5ppd.  Quit once for 4 months.   . Alcohol use No  . Drug use: No  . Sexual activity: No   Other Topics Concern  . Not on file   Social History Narrative   Patient lives at home spouse - [name].  Has 2 daughters (64 & 59).   Currently "disabled".   Caffeine Use: tea 4 glasses daily.    Smokes ~1 1/2 PPD  05/15/15   Walks everyday - no set routine.      [facility] (12/03/16):   Originally from [place] [facility]. 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 No bird exposure. No known mold exposure.     Review of systems: Review of Systems  Constitutional: Negative for fever and chills.  HENT: Negative.   Eyes: Negative for blurred vision.  Respiratory: as per HPI  Cardiovascular: Negative for chest pain and palpitations.  Gastrointestinal: Negative for vomiting, diarrhea, blood per rectum. Genitourinary: Negative for dysuria, urgency, frequency and hematuria.  Musculoskeletal: Negative for myalgias, back pain and joint pain.  Skin: Negative for itching and rash.    Neurological: Negative for dizziness, tremors, focal weakness, seizures and loss of consciousness.  Endo/Heme/Allergies: Negative for environmental allergies.  Psychiatric/Behavioral: Negative for depression, suicidal ideas and hallucinations.  All other systems reviewed and are negative.  Physical Exam: Blood pressure 118/76, pulse 98, height 5\' 4"  (1.626 m), weight 142 lb (64.4 kg), SpO2 93 %. Gen:      No acute distress HEENT:  EOMI, sclera anicteric Neck:     No masses; no thyromegaly Lungs:    Mild expiratory wheeze; normal respiratory effort CV:         Regular rate and rhythm; no murmurs Abd:      + bowel sounds; soft, non-tender; no palpable masses, no distension Ext:    No edema; adequate peripheral perfusion Skin:      Warm and dry; no rash Neuro: alert and oriented x 3 Psych: normal mood and affect  Data Reviewed: Chest x-ray 01/31/17-streaky atelectasis at the bases, hyperinflation. I have reviewed the images personally.  PFTs 01/31/17 FVC 1.70 (51%) FEV1 1.23 (48%) F/F 72  FENO 02/05/17- 9  Assessment:  COPD, asthma with acute exacerbation Unfortunately she did not get her prednisone, antibiotics from pharmacy after last visit. We will reorder the pred taper, Z-Pak I have asked her to continue the Symbicort, Mucinex. Add flutter valve for clearance of seceretions I'll add Tessalon for cough  GERD, allergic rhinitis Continues on Singulair, Flonase, allergy shots. GERD is controlled with dexilantand Zantac  Active smoker Strongly advised to quit. Time spent in counseling-5 minutes  Plan/Recommendations: - Z pack, Pred taper starting at 40 mg. Reduce dose by 10 mg every 3 days. - Continue mucinex, symbicort - Add flutter valve, tessalon  Marshell Garfinkel MD [facility] Pager [phone] 02/05/2017, 9:01 AM  CC: Nche, Lori Brooke, NP

## 2017-02-09 ENCOUNTER — Other Ambulatory Visit: Payer: Self-pay | Admitting: Nurse Practitioner

## 2017-02-09 DIAGNOSIS — F339 Major depressive disorder, recurrent, unspecified: Secondary | ICD-10-CM

## 2017-02-11 ENCOUNTER — Other Ambulatory Visit: Payer: Self-pay | Admitting: Cardiology

## 2017-02-11 DIAGNOSIS — R42 Dizziness and giddiness: Secondary | ICD-10-CM

## 2017-02-12 ENCOUNTER — Ambulatory Visit (INDEPENDENT_AMBULATORY_CARE_PROVIDER_SITE_OTHER): Payer: Medicare Other

## 2017-02-12 DIAGNOSIS — J309 Allergic rhinitis, unspecified: Secondary | ICD-10-CM

## 2017-02-13 DIAGNOSIS — J438 Other emphysema: Secondary | ICD-10-CM | POA: Diagnosis not present

## 2017-02-13 DIAGNOSIS — S62156A Nondisplaced fracture of hook process of hamate [unciform] bone, unspecified wrist, initial encounter for closed fracture: Secondary | ICD-10-CM | POA: Diagnosis not present

## 2017-02-13 DIAGNOSIS — S82309A Unspecified fracture of lower end of unspecified tibia, initial encounter for closed fracture: Secondary | ICD-10-CM | POA: Diagnosis not present

## 2017-02-13 DIAGNOSIS — J189 Pneumonia, unspecified organism: Secondary | ICD-10-CM | POA: Diagnosis not present

## 2017-02-13 DIAGNOSIS — J449 Chronic obstructive pulmonary disease, unspecified: Secondary | ICD-10-CM | POA: Diagnosis not present

## 2017-02-16 ENCOUNTER — Encounter (HOSPITAL_COMMUNITY): Payer: Self-pay | Admitting: Emergency Medicine

## 2017-02-16 ENCOUNTER — Emergency Department (HOSPITAL_COMMUNITY): Payer: Medicare Other

## 2017-02-16 ENCOUNTER — Emergency Department (HOSPITAL_COMMUNITY)
Admission: EM | Admit: 2017-02-16 | Discharge: 2017-02-16 | Disposition: A | Discharge status: Home / Self Care | Payer: Medicare Other | Attending: Emergency Medicine | Admitting: Emergency Medicine

## 2017-02-16 DIAGNOSIS — R071 Chest pain on breathing: Secondary | ICD-10-CM | POA: Diagnosis not present

## 2017-02-16 DIAGNOSIS — Z79899 Other long term (current) drug therapy: Secondary | ICD-10-CM | POA: Diagnosis not present

## 2017-02-16 DIAGNOSIS — Y999 Unspecified external cause status: Secondary | ICD-10-CM | POA: Diagnosis not present

## 2017-02-16 DIAGNOSIS — J449 Chronic obstructive pulmonary disease, unspecified: Secondary | ICD-10-CM | POA: Insufficient documentation

## 2017-02-16 DIAGNOSIS — Y9302 Activity, running: Secondary | ICD-10-CM | POA: Diagnosis not present

## 2017-02-16 DIAGNOSIS — Y92009 Unspecified place in unspecified non-institutional (private) residence as the place of occurrence of the external cause: Secondary | ICD-10-CM | POA: Diagnosis not present

## 2017-02-16 DIAGNOSIS — R079 Chest pain, unspecified: Secondary | ICD-10-CM | POA: Diagnosis not present

## 2017-02-16 DIAGNOSIS — S20219A Contusion of unspecified front wall of thorax, initial encounter: Secondary | ICD-10-CM

## 2017-02-16 DIAGNOSIS — F17210 Nicotine dependence, cigarettes, uncomplicated: Secondary | ICD-10-CM | POA: Diagnosis not present

## 2017-02-16 DIAGNOSIS — W010XXA Fall on same level from slipping, tripping and stumbling without subsequent striking against object, initial encounter: Secondary | ICD-10-CM | POA: Insufficient documentation

## 2017-02-16 DIAGNOSIS — S299XXA Unspecified injury of thorax, initial encounter: Secondary | ICD-10-CM | POA: Diagnosis not present

## 2017-02-16 MED ORDER — OXYCODONE-ACETAMINOPHEN 5-325 MG PO TABS: 2.0000 | ORAL_TABLET | Freq: Four times a day (QID) | ORAL | 0 refills | Status: DC | PRN

## 2017-02-16 MED ORDER — OXYCODONE-ACETAMINOPHEN 5-325 MG PO TABS
2.0000 | ORAL_TABLET | Freq: Once | ORAL | Status: AC
  Administered 2017-02-16: 2 via ORAL
  Filled 2017-02-16: qty 2

## 2017-02-16 NOTE — Discharge Instructions (Signed)
Follow-up with her primary care doctor in the next 24-48 hours for further evaluation.  Take the pain medication as directed.  Apply ice to help with the pain.  Do not lift anything heavy or involve instructed activity.  Return the emergency room for any worsening pain, difficulty breathing, fever, worsening or concerning symptoms.

## 2017-02-16 NOTE — ED Provider Notes (Signed)
[facility] DEPT Provider Note   CSN: [id number] Arrival date & time: 02/16/17  1133     History   Chief Complaint Chief Complaint  Patient presents with  . Rib Injury  . Chest Pain    HPI Lori [name] is a 61 y.o. female with Muscle skeletal chest pain 4 days after mechanical fall. Patient reports that she was sleeping and had a dream that caused her to wake up and run to the other room. When she did that she tripped causing her to fall face forward onto the floor. She denies any LOC and she is able to recall the entire event. She denies any episodes of vomiting since this accident happened. Since the fall, patient has experienced sharp bilateral chest pain over the diaphragm area. She describes pain as a "pulling" whenever she moves. She reports that this pain is worsened with deep inspiration, cough and sudden sharp movements. She has taken Advil with no improvement of pain. Patient came to the emergency department today because pain has continued to persist and not been relieved by over-the-counter medications. Patient denies any fever, nausea/vomiting, shortness of breath. She denies any cardiac chest pain in nature. She denies any recent immobilization, prior history of DVT/PE, recent surgery, leg swelling, or long travel.   The history is provided by the patient.    Past Medical History:  Diagnosis Date  . Acute MI ([place])    x3 - by report only. She has had 3 negative Myoview stress test with no evidence of prior infarct.  . Allergic rhinitis   . Allergy   . Anemia   . Ankle fracture 05/2016  . Anxiety   . Arthritis   . Asthma   . Barrett esophagus 2007  . Chest pain 05-02-2009   echo  EF 55%  . COPD (chronic obstructive pulmonary disease) with chronic bronchitis (H[facility])    & Emphysema  . Depression   . Duodenitis without mention of hemorrhage 2007  . Esophageal reflux 2007  . Esophageal stricture   . Full dentures   . H/O hiatal hernia   . Hiatal hernia  [id number]  . Hyperlipidemia   . Multiple fractures    from falls, fx rt. elbow, fx left wrist, bilateral ankles  . Pneumonia 10/2016  . Ulcer     Patient Active Problem List   Diagnosis Date Noted  . COPD mixed type ([place]) 01/31/2017  . Dizziness 01/20/2017  . Perennial allergic rhinitis 01/01/2017  . Moderate persistent asthma, uncomplicated 54/62/7035  . Pulmonary emphysema ([place]) 12/03/2016  . Tobacco use disorder 12/03/2016  . Chronic seasonal allergic rhinitis 12/03/2016  . Medication management 11/29/2016  . Cough 11/23/2016  . Rash 11/23/2016  . Vertigo 10/22/2016  . Gastroesophageal reflux disease with esophagitis 10/22/2016  . Non compliance w medication regimen 08/01/2016  . Allergic contact dermatitis due to metals 08/01/2016  . Migraine without aura and without status migrainosus, not intractable 07/22/2016  . Chronic rhinitis 06/19/2016  . Gait difficulty 06/21/2015  . Fibromyalgia 06/21/2015  . Conversion reaction 06/21/2015  . Depression 06/21/2015  . Syncope and collapse 06/21/2015  . Plica syndrome of left knee 01/06/2015  . Anxiety state 09/06/2014  . History of MI 09/06/2014  . Cigarette smoker 06/27/2013  . Borderline hypertension 06/27/2013  . Leg pain, bilateral 06/14/2013  . GASTROPARESIS 10/26/2009  . Musculoskeletal chest pain 05/02/2009  . Hyperlipidemia LDL goal <100 11/18/2008  . Asthma 11/18/2008  . HEPATIC CYST 11/18/2008    Past Surgical History:  Procedure Laterality Date  . ABDOMINAL HYSTERECTOMY     BSO  . APPENDECTOMY    . CESAREAN SECTION     x 2  . CHOLECYSTECTOMY    . CHONDROPLASTY Left 01/06/2015   Procedure: CHONDROPLASTY;  Surgeon: Marybelle Killings, MD;  Location: [facility];  Service: Orthopedics;  Laterality: Left;  . COLONOSCOPY    . ELBOW FRACTURE SURGERY     right  . KNEE ARTHROSCOPY WITH EXCISION PLICA Left 1/66/0630   Procedure: KNEE ARTHROSCOPY WITH EXCISION PLICA;  Surgeon: Marybelle Killings, MD;  Location:  [facility];  Service: Orthopedics;  Laterality: Left;  . Lower Extremity Arterial Dopplers  07/06/2013   RABI 1.0, LABI 1.1.; No evidence of significant vascular atherogenic plaque  . NECK SURGERY    . NM MYOVIEW LTD  09/2014   LOW RISK. Normal EF of 65% with no regional wall motion abnormalities. No ischemia or infarction.  . TONSILLECTOMY    . WRIST FRACTURE SURGERY     left, has plate    OB History    No data available       Home Medications    Prior to Admission medications   Medication Sig Start Date End Date Taking? Authorizing Provider  albuterol (PROVENTIL HFA;VENTOLIN HFA) 108 (90 Base) MCG/ACT inhaler Inhale 2 puffs into the lungs every 6 (six) hours as needed for wheezing or shortness of breath. 10/29/16  Yes Barton Dubois, MD  benzonatate (TESSALON) 100 MG capsule Take 1 capsule (100 mg total) by mouth 3 (three) times daily as needed for cough. 02/05/17  Yes Mannam, Praveen, MD  budesonide-formoterol (SYMBICORT) 80-4.5 MCG/ACT inhaler Inhale 2 puffs into the lungs 2 (two) times daily. 01/31/17  Yes Javier Glazier, MD  buPROPion (WELLBUTRIN XL) 150 MG 24 hr tablet Take 1 tablet (150 mg total) by mouth daily. 01/17/17  Yes Nche, Charlene Brooke, NP  cyclobenzaprine (FLEXERIL) 5 MG tablet Take 1 tablet (5 mg total) by mouth 3 (three) times daily as needed for muscle spasms. 01/17/17  Yes Nche, Charlene Brooke, NP  dexlansoprazole (DEXILANT) 60 MG capsule Take 1 capsule (60 mg total) by mouth daily. 12/03/16  Yes Javier Glazier, MD  dextromethorphan-guaiFENesin [facility] DM) 30-600 MG 12hr tablet Take 1 tablet by mouth 2 (two) times daily as needed for cough. 01/17/17  Yes Nche, Charlene Brooke, NP  famotidine (PEPCID) 20 MG tablet TK 1 T PO BID 10/09/16  Yes [provider]  fenofibrate (TRICOR) 145 MG tablet  10/26/16  Yes [provider]  gabapentin (NEURONTIN) 600 MG tablet Take 600 mg by mouth 2 (two) times daily. 02/09/17  Yes [provider]  meclizine (ANTIVERT) 12.5 MG tablet Take 1 tablet (12.5 mg total) by mouth 2 (two) times daily as needed for dizziness. Only as needed for DIZZINESS 01/17/17  Yes Nche, Charlene Brooke, NP  montelukast (SINGULAIR) 10 MG tablet Take 1 tablet (10 mg total) by mouth at bedtime. 01/02/17  Yes Valentina Shaggy, MD  ranitidine (ZANTAC) 150 MG tablet Take 1 tablet (150 mg total) by mouth at bedtime. 01/31/17  Yes Javier Glazier, MD  simvastatin (ZOCOR) 20 MG tablet TAKE 1 TABLET BY MOUTH EVERY EVENING BEFORE BEDTIME FOR CHOLESTEROL 10/21/16  Yes Nche, Charlene Brooke, NP  traZODone (DESYREL) 50 MG tablet Take 50 mg by mouth at bedtime.  12/21/16  Yes [provider]  valACYclovir (VALTREX) 500 MG tablet Take 500 mg by mouth daily. 07/23/16  Yes [provider]  EPINEPHrine 0.3 mg/0.3 mL IJ SOAJ injection Use as directed for severe allergic reaction 10/04/16   Valentina Shaggy, MD  nitroGLYCERIN (NITROSTAT) 0.4 MG SL tablet PLACE 1 TABLET UNDER THE TONGUE EVERY 5 MINUTES AS NEEDED FOR CHEST PAIN 12/10/16   Leonie Man, MD  oxyCODONE-acetaminophen (PERCOCET/ROXICET) 5-325 MG tablet Take 2 tablets by mouth every 6 (six) hours as needed for severe pain. 02/16/17   Volanda Napoleon, PA-C    Family History Family History  Problem Relation Age of Onset  . Emphysema Father   . Dementia Mother   . Diabetes Maternal Grandmother   . Diabetes Maternal Aunt   . Diabetes Unknown        mat. cousin  . Heart disease Brother   . Colon cancer Neg Hx   . Allergic rhinitis Neg Hx   . Angioedema Neg Hx   . Asthma Neg Hx   . Atopy Neg Hx   . Eczema Neg Hx   . Immunodeficiency Neg Hx   . Urticaria Neg Hx     Social History Social History  Substance Use Topics  . Smoking status: Current Every Day Smoker    Packs/day: 0.50    Years: 39.00    Types: Cigarettes    Start date: 08/06/1977  . Smokeless tobacco: Never Used     Comment: Currently smoking 0.5ppd.  Quit once for 4 months.     . Alcohol use No     Allergies   Bee venom; Codeine; Ibuprofen; Levofloxacin; Propoxyphene n-acetaminophen; Toradol [ketorolac tromethamine]; Ultram [tramadol hcl]; Hydrocodone-acetaminophen; and Latex   Review of Systems Review of Systems  Constitutional: Negative for fever.  Respiratory: Negative for shortness of breath.   Cardiovascular: Positive for chest pain (Musculoskeletal).  Gastrointestinal: Negative for nausea and vomiting.     Physical Exam Updated Vital Signs BP (!) 111/52 (BP Location: Right Arm)   Pulse 87   Temp 98.4 F (36.9 C) (Oral)   Resp 17   SpO2 94%   Physical Exam  Constitutional: She appears well-developed and well-nourished.  Appears uncomfortable but no acute distress  HENT:  Head: Normocephalic and atraumatic.  Right Ear: Tympanic membrane normal. No hemotympanum.  Left Ear: Tympanic membrane normal. No hemotympanum.  Eyes: Conjunctivae and EOM are normal. Right eye exhibits no discharge. Left eye exhibits no discharge. No scleral icterus.  Pulmonary/Chest: Effort normal and breath sounds normal. She has no decreased breath sounds. She exhibits tenderness. She exhibits no crepitus and no deformity.    No evidence of respiratory distress. Able to speak in full sentences without difficulty. Tenderness palpation bilaterally to the anterior chest just below the breasts. No crepitus or deformity felt. No flail chest noted. No overlying ecchymosis, edema, erythema or warmth. Pain is reproduced with movement of her torso.  Abdominal: She exhibits distension. There is no tenderness.  Protuberant abdomen (the patient states that this is chronic and denies any change). No tenderness to palpation.   Musculoskeletal: Normal range of motion.  Full range of motion of bilateral upper extremities without difficulty.  Neurological: She is alert.  Skin: Skin is warm and dry. Capillary refill takes less than 2 seconds.  Small area of ecchymosis to the right  upper arm. No deformity or crepitus.  Psychiatric: She has a normal mood and affect. Her speech is normal and behavior is normal.  Nursing note and vitals reviewed.    ED Treatments / Results  Labs (all labs ordered are listed, but only abnormal results are displayed)  Labs Reviewed - No data to display  EKG  EKG Interpretation None       Radiology Dg Chest 2 View  Result Date: 02/16/2017 CLINICAL DATA:  Chest and rib pain following fall. EXAM: CHEST  2 VIEW COMPARISON:  01/31/2017 chest radiograph and prior studies FINDINGS: Increasing confluent opacity in the lateral left lower lung is identified. Lucency along the lateral left hemithorax his noted. The cardiomediastinal silhouette is unremarkable. Mild chronic peribronchial thickening noted. There is no definite rib fracture. No other interval change. IMPRESSION: Increasing confluent opacity at the lateral left lung base with indeterminate and lucency along the lateral left hemithorax. A small left lateral pneumothorax is not entirely excluded. Consider CT for further evaluation. Electronically Signed   By: Margarette Canada M.D.   On: 02/16/2017 12:49   Ct Chest Wo Contrast  Result Date: 02/16/2017 CLINICAL DATA:  Rib and lower chest pain after a fall several days ago. EXAM: CT CHEST WITHOUT CONTRAST TECHNIQUE: Multidetector CT imaging of the chest was performed following the standard protocol without IV contrast. COMPARISON:  No comparison studies available. FINDINGS: Cardiovascular: The heart size is normal. No pericardial effusion. Coronary artery calcification is noted. Atherosclerotic calcification is noted in the wall of the thoracic aorta. Mediastinum/Nodes: Scattered small lymph nodes evident. No evidence for gross hilar lymphadenopathy although assessment is limited by the lack of intravenous contrast on today's study. Tiny hiatal hernia. The esophagus has normal imaging features. There is no axillary lymphadenopathy. Lungs/Pleura:  Centrilobular emphysema. Areas of atelectasis in linear scarring noted in the lower lungs bilaterally. No pulmonary edema. No substantial pleural effusion. Upper Abdomen: Unremarkable. Musculoskeletal: Bone windows reveal no worrisome lytic or sclerotic osseous lesions. No evidence for an acute rib fracture. IMPRESSION: 1. Emphysema with lower lung atelectasis and scarring. No pneumothorax. 2. No evidence for rib fracture. Electronically Signed   By: Misty Stanley M.D.   On: 02/16/2017 13:28    Procedures Procedures (including critical care time)  Medications Ordered in ED Medications  oxyCODONE-acetaminophen (PERCOCET/ROXICET) 5-325 MG per tablet 2 tablet (2 tablets Oral Given 02/16/17 1228)     Initial Impression / Assessment and Plan / ED Course  I have reviewed the triage vital signs and the nursing notes.  Pertinent labs & imaging results that were available during my care of the patient were reviewed by me and considered in my medical decision making (see chart for details).     61 year-old female who presents with rib/chest pain that began 4 days ago after mechanical fall. Patient is afebrile, non-toxic appearing, sitting comfortably on examination table. Vital signs reviewed. Patient stable. O2 sat is 100% on  percent on room air. No signs of respiratory distress. No LOC, no vomiting, no abnormal behavior. Patient is able to recall the entire event. Given reassuring physical exam and per [facility] CT criteria, no imaging is indicated at this time.  Consider musculoskeletal pain versus rib fracture versus rib contusion. History/physical exam is not concerning for pneumothorax. History/physical exam is not concerning for PE given that this occurred after a mechanical fall and lack of risk factors. History/physical not concerning for cardiac etiology. Will obtain chest x-ray to evaluate for possible rib fracture/injury. Analgesics given in the department.  Chest x-ray negative for any  acute rib fractures but cannot rule out a left-sided pneumothorax. Will plan to obtain chest CT for further evaluation of potential pneumothorax. Discussed results with patient. She is agreeable to plan. She reports that she still having significant pain but  is slightly improved after pain medication.  CT chest reviewed. Negative for any acute fracture or pneumothorax of the lungs. Discussed results with patient. Vital signs reviewed. Patient's O2 sats has remained above 95% on room air during conversation. Patient no acute respiratory distress. Reexamination shows no abdominal tenderness. She is still having some diffuse tenderness to the bilateral anterior chest area. Discussed that symptoms likely result of contusion or muscular pain. Will plan to send patient home with short course of pain medications for pain control. Instructed her follow up with her primary care doctor next 24-48 further evaluation. Return precautions discussed. Patient expresses understanding and agreement to plan.    Final Clinical Impressions(s) / ED Diagnoses   Final diagnoses:  Contusion of rib, unspecified laterality, initial encounter    New Prescriptions New Prescriptions   OXYCODONE-ACETAMINOPHEN (PERCOCET/ROXICET) 5-325 MG TABLET    Take 2 tablets by mouth every 6 (six) hours as needed for severe pain.     Volanda Napoleon, PA-C 02/16/17 1402    Little, Wenda Overland, MD 02/16/17 [id number]

## 2017-02-16 NOTE — ED Triage Notes (Signed)
Pt c/o rib and lower chest pain (diaphragm) pain r/t fall several days ago. Pt states she fell off of sofa hitting side table and floor. Pt states pain along ribs that worsens with inspiration. No distress.

## 2017-02-16 NOTE — ED Notes (Signed)
Patient transported to CT 

## 2017-02-17 ENCOUNTER — Encounter (HOSPITAL_COMMUNITY): Payer: Medicare Other

## 2017-02-19 ENCOUNTER — Ambulatory Visit: Payer: Medicare Other | Admitting: Cardiology

## 2017-02-20 ENCOUNTER — Encounter: Payer: Self-pay | Admitting: Nurse Practitioner

## 2017-02-20 ENCOUNTER — Ambulatory Visit (HOSPITAL_COMMUNITY)
Admission: RE | Admit: 2017-02-20 | Discharge: 2017-02-20 | Disposition: A | Discharge status: Home / Self Care | Payer: Medicare Other | Source: Ambulatory Visit | Attending: Cardiovascular Disease | Admitting: Cardiovascular Disease

## 2017-02-20 ENCOUNTER — Ambulatory Visit (INDEPENDENT_AMBULATORY_CARE_PROVIDER_SITE_OTHER): Payer: Medicare Other | Admitting: Nurse Practitioner

## 2017-02-20 VITALS — BP 110/74 | HR 94 | Temp 98.4°F | Ht 64.0 in | Wt 148.0 lb

## 2017-02-20 DIAGNOSIS — R42 Dizziness and giddiness: Secondary | ICD-10-CM | POA: Diagnosis not present

## 2017-02-20 DIAGNOSIS — F411 Generalized anxiety disorder: Secondary | ICD-10-CM

## 2017-02-20 DIAGNOSIS — S20219A Contusion of unspecified front wall of thorax, initial encounter: Secondary | ICD-10-CM | POA: Diagnosis not present

## 2017-02-20 DIAGNOSIS — Z9114 Patient's other noncompliance with medication regimen: Secondary | ICD-10-CM

## 2017-02-20 DIAGNOSIS — F339 Major depressive disorder, recurrent, unspecified: Secondary | ICD-10-CM | POA: Diagnosis not present

## 2017-02-20 DIAGNOSIS — K3184 Gastroparesis: Secondary | ICD-10-CM

## 2017-02-20 DIAGNOSIS — I6523 Occlusion and stenosis of bilateral carotid arteries: Secondary | ICD-10-CM | POA: Insufficient documentation

## 2017-02-20 MED ORDER — PROMETHAZINE HCL 25 MG PO TABS: 25.0000 mg | ORAL_TABLET | Freq: Three times a day (TID) | ORAL | 0 refills | Status: DC | PRN

## 2017-02-20 MED ORDER — CITALOPRAM HYDROBROMIDE 40 MG PO TABS: 40.0000 mg | ORAL_TABLET | Freq: Every day | ORAL | 1 refills | Status: DC

## 2017-02-20 NOTE — Patient Instructions (Addendum)
Please medications as prescribed. Inquire from GI about continuous use of promethazine and further refills. Complete pain medication as prescribed. May also use acetaminophen for pain as directed on package.  I instructed pt to start 1/2 tablet once daily for 1 week and then increase to a full tablet once daily on week two as tolerated.   We discussed common side effects such as nausea, drowsiness and weight gain.  Also discussed rare but serious side effect of suicide ideation.  She is instructed to discontinue medication go directly to ED if this occurs.  Pt verbalizes understanding.   Plan follow up in 1 month to evaluate progress.     Chest Contusion, Adult A chest contusion is a deep bruise on the chest. Bruises happen when an injury causes bleeding under the skin. Signs of bruising include pain, puffiness (swelling), and skin that has changed from its normal color. The bruise may turn blue, purple, or yellow. Minor injuries may give you a painless bruise, but worse bruises may stay painful and swollen for a few weeks. Follow these instructions at home:  If directed, put ice on the injured area. ? Put ice in a plastic bag. ? Place a towel between your skin and the bag. ? Leave the ice on for 20 minutes, 2-3 times per day.  Take over-the-counter and prescription medicines only as told by your doctor.  If told by your doctor, do deep-breathing exercises.  Do not lie down flat on your back. Keep your head and chest raised (elevated) when you rest or sleep.  Do not use any products that contain nicotine or tobacco, such as cigarettes and e-cigarettes. If you need help quitting, ask your doctor.  Do not lift anything that causes pain. Contact a doctor if:  Medicines or treatment do not help your swelling or pain.  You have more bruising.  You have more swelling.  Your pain gets worse  Your symptoms do not get better in one week. Get help right away if:  You suddenly have a  lot more pain.  You have trouble breathing.  You feel dizzy or weak.  You pass out (faint).  You have blood in your pee (urine) or poop (stool).  You cough up blood or you throw up (vomit) blood. Summary  A chest contusion is a deep bruise on the chest. Bruises happen when an injury causes bleeding under the skin.  Treatment may include resting and putting ice on the injured area.  Contact a doctor if you have trouble breathing or if your pain does not get better with treatment. This information is not intended to replace advice given to you by your health care provider. Make sure you discuss any questions you have with your health care provider. Document Released: 02/19/2008 Document Revised: 05/30/2016 Document Reviewed: 05/30/2016 Elsevier Interactive Patient Education  2017 [employer].

## 2017-02-20 NOTE — Progress Notes (Signed)
Subjective:  Patient ID: Lori [name], female    DOB: May 30, 1956  Age: 61 y.o. MRN: [id number]  CC: Hospitalization Follow-up (HOSPITAL F/U/FALL 2 WK AGO,BRUISES AND PAIN--WENT TO [facility] hospital--oxy didnt help/med consult?)   Chest Pain   This is a new problem. The current episode started in the past 7 days. The onset quality is sudden. The problem occurs constantly. The problem has been unchanged. The pain is present in the substernal region. The quality of the pain is described as dull. The pain does not radiate. Pertinent negatives include no cough, fever, irregular heartbeat, malaise/fatigue, shortness of breath or weakness. Risk factors include smoking/tobacco exposure, sedentary lifestyle and post-menopausal.  fell OOB while asleep. Fell on floor, hit anterior chest wall. Evaluated at hospital, diagnosed with chest wall contusion.  Anxiety: Complains of persistent anxiety. Has not taking any medications in a while.  Chronic Nausea secondary to Gastroparesis: Needs promethazine refill. Has upcoming appt with GI in 33month.  She admits to not taking any of her medications as prescribed.  Outpatient Medications Prior to Visit  Medication Sig Dispense Refill  . dexlansoprazole (DEXILANT) 60 MG capsule Take 1 capsule (60 mg total) by mouth daily. 30 capsule 6  . EPINEPHrine 0.3 mg/0.3 mL IJ SOAJ injection Use as directed for severe allergic reaction 2 Device 1  . gabapentin (NEURONTIN) 600 MG tablet Take 600 mg by mouth 2 (two) times daily.  2  . montelukast (SINGULAIR) 10 MG tablet Take 1 tablet (10 mg total) by mouth at bedtime. 30 tablet 5  . oxyCODONE-acetaminophen (PERCOCET/ROXICET) 5-325 MG tablet Take 2 tablets by mouth every 6 (six) hours as needed for severe pain. 12 tablet 0  . buPROPion (WELLBUTRIN XL) 150 MG 24 hr tablet Take 1 tablet (150 mg total) by mouth daily. 30 tablet 2  . traZODone (DESYREL) 50 MG tablet Take 50 mg by mouth at bedtime.   2  . albuterol (PROVENTIL  HFA;VENTOLIN HFA) 108 (90 Base) MCG/ACT inhaler Inhale 2 puffs into the lungs every 6 (six) hours as needed for wheezing or shortness of breath. (Patient not taking: Reported on 02/20/2017) 1 Inhaler 3  . budesonide-formoterol (SYMBICORT) 80-4.5 MCG/ACT inhaler Inhale 2 puffs into the lungs 2 (two) times daily. (Patient not taking: Reported on 02/20/2017) 1 Inhaler 6  . dextromethorphan-guaiFENesin (MUCINEX DM) 30-600 MG 12hr tablet Take 1 tablet by mouth 2 (two) times daily as needed for cough. (Patient not taking: Reported on 02/20/2017) 14 tablet 0  . fenofibrate (TRICOR) 145 MG tablet     . ranitidine (ZANTAC) 150 MG tablet Take 1 tablet (150 mg total) by mouth at bedtime. (Patient not taking: Reported on 02/20/2017) 30 tablet 11  . simvastatin (ZOCOR) 20 MG tablet TAKE 1 TABLET BY MOUTH EVERY EVENING BEFORE BEDTIME FOR CHOLESTEROL 30 tablet 4  . valACYclovir (VALTREX) 500 MG tablet Take 500 mg by mouth daily.    . benzonatate (TESSALON) 100 MG capsule Take 1 capsule (100 mg total) by mouth 3 (three) times daily as needed for cough. (Patient not taking: Reported on 02/20/2017) 90 capsule 1  . cyclobenzaprine (FLEXERIL) 5 MG tablet Take 1 tablet (5 mg total) by mouth 3 (three) times daily as needed for muscle spasms. (Patient not taking: Reported on 02/20/2017) 30 tablet 0  . famotidine (PEPCID) 20 MG tablet TK 1 T PO BID  0  . meclizine (ANTIVERT) 12.5 MG tablet Take 1 tablet (12.5 mg total) by mouth 2 (two) times daily as needed for dizziness. Only as  needed for DIZZINESS (Patient not taking: Reported on 02/20/2017) 30 tablet 0  . nitroGLYCERIN (NITROSTAT) 0.4 MG SL tablet PLACE 1 TABLET UNDER THE TONGUE EVERY 5 MINUTES AS NEEDED FOR CHEST PAIN (Patient not taking: Reported on 02/20/2017) 25 tablet 1   No facility-administered medications prior to visit.     ROS See HPI  Objective:  BP 110/74   Pulse 94   Temp 98.4 F (36.9 C)   Ht 5\' 4"  (1.626 m)   Wt 148 lb (67.1 kg)   SpO2 96%   BMI 25.40 kg/m    BP Readings from Last 3 Encounters:  02/20/17 110/74  02/16/17 129/73  02/05/17 118/76    Wt Readings from Last 3 Encounters:  02/20/17 148 lb (67.1 kg)  02/05/17 142 lb (64.4 kg)  01/31/17 146 lb 12.8 oz (66.6 kg)    Physical Exam  Constitutional: She is oriented to person, place, and time. No distress.  Neck: Normal range of motion. Neck supple.  Cardiovascular: Normal rate and normal heart sounds.   Pulmonary/Chest: Effort normal. No respiratory distress. She exhibits tenderness.  Abdominal: Soft.  Musculoskeletal: She exhibits no edema.  Neurological: She is alert and oriented to person, place, and time.  Skin: Skin is warm and dry.  Vitals reviewed.   Lab Results  Component Value Date   WBC 10.9 (H) 10/27/2016   HGB 10.9 (L) 10/27/2016   HCT 32.4 (L) 10/27/2016   PLT 268 10/27/2016   GLUCOSE 88 11/20/2016   CHOL 201 (H) 11/20/2016   TRIG 277 (H) 11/20/2016   HDL 42 11/20/2016   LDLDIRECT 187.0 10/21/2016   LDLCALC 104 (H) 11/20/2016   ALT 12 11/20/2016   AST 9 11/20/2016   NA 139 11/20/2016   K 4.3 11/20/2016   CL 101 11/20/2016   CREATININE 0.83 11/20/2016   BUN 6 (L) 11/20/2016   CO2 23 11/20/2016   TSH 1.74 06/21/2016   INR 0.9 08/31/2008   HGBA1C 5.5 06/21/2016    No results found.  Assessment & Plan:   Lori [name] was seen today for hospitalization follow-up.  Diagnoses and all orders for this visit:  Contusion of chest wall, unspecified laterality, initial encounter  Anxiety state -     citalopram (CELEXA) 40 MG tablet; Take 1 tablet (40 mg total) by mouth daily.  Non compliance w medication regimen  Episode of recurrent major depressive disorder, unspecified depression episode severity (Lori [name]) -     citalopram (CELEXA) 40 MG tablet; Take 1 tablet (40 mg total) by mouth daily.  Gastroparesis -     promethazine (PHENERGAN) 25 MG tablet; Take 1 tablet (25 mg total) by mouth every 8 (eight) hours as needed for nausea or vomiting.   I have  discontinued Lori [name]'s nitroGLYCERIN, famotidine, traZODone, buPROPion, meclizine, cyclobenzaprine, and benzonatate. I have also changed her promethazine. Additionally, I am having her start on citalopram. Lastly, I am having her maintain her EPINEPHrine, simvastatin, valACYclovir, albuterol, dexlansoprazole, fenofibrate, montelukast, dextromethorphan-guaiFENesin, ranitidine, budesonide-formoterol, gabapentin, and oxyCODONE-acetaminophen.  Meds ordered this encounter  Medications  . DISCONTD: promethazine (PHENERGAN) 25 MG tablet    Sig: Take 25 mg by mouth every 6 (six) hours as needed for nausea or vomiting.  . citalopram (CELEXA) 40 MG tablet    Sig: Take 1 tablet (40 mg total) by mouth daily.    Dispense:  90 tablet    Refill:  1    Order Specific Question:   Supervising Provider    Answer:   Cassandria Anger [  [id number]]  . promethazine (PHENERGAN) 25 MG tablet    Sig: Take 1 tablet (25 mg total) by mouth every 8 (eight) hours as needed for nausea or vomiting.    Dispense:  20 tablet    Refill:  0    Order Specific Question:   Supervising Provider    Answer:   Cassandria Anger [[id number]]    Follow-up: Return in about 4 weeks (around 03/20/2017), or if symptoms worsen or fail to improve, for anxiety and depression.Wilfred Lacy, NP

## 2017-02-20 NOTE — Assessment & Plan Note (Signed)
She stopped taking wellbutrin, trazodone and celexa 10/2016. Stating she feels fine. She still declined referral to psychiatry and/or psychology.

## 2017-02-21 ENCOUNTER — Telehealth: Payer: Self-pay | Admitting: *Deleted

## 2017-02-21 NOTE — Telephone Encounter (Signed)
-----   Message from Leonie Man, MD sent at 02/20/2017 11:42 PM EDT ----- Carotid Doppler results --> normal study. Normal carotids (less than 40% stenosis bilaterally), vertebrals and subclavian arteries.  No need to follow-up with routing evaluation.  Glenetta Hew

## 2017-02-21 NOTE — Telephone Encounter (Signed)
Spoke to patient. Result given . Verbalized understanding  

## 2017-02-21 NOTE — Telephone Encounter (Signed)
LEFT MESSAGE TO CALL BACK  IN REGARDS TO CAROTID DOPPLER

## 2017-02-25 ENCOUNTER — Telehealth: Payer: Self-pay | Admitting: *Deleted

## 2017-02-25 DIAGNOSIS — Z79899 Other long term (current) drug therapy: Secondary | ICD-10-CM

## 2017-02-25 DIAGNOSIS — E785 Hyperlipidemia, unspecified: Secondary | ICD-10-CM

## 2017-02-25 NOTE — Telephone Encounter (Signed)
-----   Message from Raiford Simmonds, RN sent at 11/29/2016  5:01 PM EDT ----- Allen Kell February 28 2017  DUE March 31 2017 LIPID, [id number]

## 2017-02-25 NOTE — Telephone Encounter (Signed)
Mail lab slip and letter 

## 2017-02-26 ENCOUNTER — Other Ambulatory Visit: Payer: Self-pay | Admitting: Nurse Practitioner

## 2017-02-26 DIAGNOSIS — F339 Major depressive disorder, recurrent, unspecified: Secondary | ICD-10-CM

## 2017-02-26 NOTE — Telephone Encounter (Signed)
Request came from pharmacy for this med but you discontinue this med on 02/20/2017. Please advise.

## 2017-02-27 ENCOUNTER — Telehealth: Payer: Self-pay | Admitting: Nurse Practitioner

## 2017-02-27 NOTE — Telephone Encounter (Signed)
Pt called in said that she some questions about a few of her meds and would like nurse to call her

## 2017-02-27 NOTE — Telephone Encounter (Signed)
Spoke with pt, she is aware that [facility] denied Bupropion because she not taking it like she should and she is on Citalopram already. Pt verbalized understand.

## 2017-03-11 ENCOUNTER — Encounter: Payer: Self-pay | Admitting: Pulmonary Disease

## 2017-03-11 ENCOUNTER — Telehealth: Payer: Self-pay | Admitting: Pulmonary Disease

## 2017-03-11 ENCOUNTER — Ambulatory Visit (INDEPENDENT_AMBULATORY_CARE_PROVIDER_SITE_OTHER): Payer: Medicare Other | Admitting: Pulmonary Disease

## 2017-03-11 ENCOUNTER — Ambulatory Visit (INDEPENDENT_AMBULATORY_CARE_PROVIDER_SITE_OTHER): Payer: Medicare Other

## 2017-03-11 VITALS — BP 114/72 | HR 99 | Ht 64.0 in | Wt 143.8 lb

## 2017-03-11 DIAGNOSIS — R059 Cough, unspecified: Secondary | ICD-10-CM

## 2017-03-11 DIAGNOSIS — K219 Gastro-esophageal reflux disease without esophagitis: Secondary | ICD-10-CM

## 2017-03-11 DIAGNOSIS — J309 Allergic rhinitis, unspecified: Secondary | ICD-10-CM | POA: Diagnosis not present

## 2017-03-11 DIAGNOSIS — F17200 Nicotine dependence, unspecified, uncomplicated: Secondary | ICD-10-CM

## 2017-03-11 DIAGNOSIS — J449 Chronic obstructive pulmonary disease, unspecified: Secondary | ICD-10-CM | POA: Diagnosis not present

## 2017-03-11 DIAGNOSIS — F17210 Nicotine dependence, cigarettes, uncomplicated: Secondary | ICD-10-CM | POA: Diagnosis not present

## 2017-03-11 DIAGNOSIS — R05 Cough: Secondary | ICD-10-CM

## 2017-03-11 DIAGNOSIS — J302 Other seasonal allergic rhinitis: Secondary | ICD-10-CM

## 2017-03-11 MED ORDER — RANITIDINE HCL 150 MG PO TABS: 150.0000 mg | ORAL_TABLET | Freq: Every day | ORAL | 11 refills | Status: DC

## 2017-03-11 MED ORDER — HYDROCOD POLST-CPM POLST ER 10-8 MG/5ML PO SUER: 5.0000 mL | Freq: Every evening | ORAL | 0 refills | Status: DC | PRN

## 2017-03-11 MED ORDER — PREDNISONE 20 MG PO TABS: 40.0000 mg | ORAL_TABLET | Freq: Every day | ORAL | 0 refills | Status: DC

## 2017-03-11 NOTE — Telephone Encounter (Signed)
Patient returned phone call..ert ° °

## 2017-03-11 NOTE — Telephone Encounter (Signed)
Honestly short of using Robitussin over-the-counter I would need a list from her insurance of cough medications and their coverage levels to pick further. J.

## 2017-03-11 NOTE — Progress Notes (Signed)
Subjective:    Patient ID: Lori [name], female    DOB: 03/16/1956, 61 y.o.   MRN: [id number]  C.C.:  Follow-up for COPD Mixed Type w/ Emphysema, Chronic Seasonal Allergic Rhinitis, GERD & Tobacco Use Disorder.   HPI COPD mixed type with emphysema: Treated at last appointment with me for an exacerbation with azithromycin and prednisone taper. Previously on Dulera and Spiriva but had transitioned off utilizing only her albuterol inhaler. Saw Dr. Joylene John on 5/23 and flutter valve added to patient's regimen. At last appointment with me patient was started on Symbicort 80/4.5 with spacer. She reports she is coughing up some yellow mucus. She has had intermittent wheezing & is using her rescue inhaler twice daily. Reports she is compliant with her Symbicort. She reports she had no relief with the [employer]   Chronic seasonal allergic rhinitis: Following with allergy/immunology. On immunotherapy. Also prescribed Singulair. She reports she is having sinus congestion & drainage.   GERD: Previously on Dexilant and Zantac. She reports no reflux or dyspepsia.   Tobacco use disorder: At last appointment patient admitted to smoking less than 1 pack per day. Patient's husband also smoking within the home. Previously using a nicotine patch. She still is smoking 1ppd. She is no longer using patches.   Review of Systems She is still having pain in her lower ribs and upper abdomen. She is having intermittent nausea with some weakness this morning. No fever, chills, or sweats.   Allergies  Allergen Reactions  . Bee Venom Swelling  . Codeine Nausea Only    CAUSES ULCERS  . Ibuprofen Nausea And Vomiting  . Levofloxacin Nausea Only  . Propoxyphene N-Acetaminophen Nausea Only  . Toradol [Ketorolac Tromethamine] Nausea And Vomiting  . Ultram [Tramadol Hcl]   . Hydrocodone-Acetaminophen Nausea Only    REACTION: "sick"  . Latex Rash    Current Outpatient Prescriptions on File Prior to Visit    Medication Sig Dispense Refill  . albuterol (PROVENTIL HFA;VENTOLIN HFA) 108 (90 Base) MCG/ACT inhaler Inhale 2 puffs into the lungs every 6 (six) hours as needed for wheezing or shortness of breath. 1 Inhaler 3  . budesonide-formoterol (SYMBICORT) 80-4.5 MCG/ACT inhaler Inhale 2 puffs into the lungs 2 (two) times daily. 1 Inhaler 6  . dexlansoprazole (DEXILANT) 60 MG capsule Take 1 capsule (60 mg total) by mouth daily. 30 capsule 6  . EPINEPHrine 0.3 mg/0.3 mL IJ SOAJ injection Use as directed for severe allergic reaction 2 Device 1  . fenofibrate (TRICOR) 145 MG tablet     . gabapentin (NEURONTIN) 600 MG tablet Take 600 mg by mouth 2 (two) times daily.  2  . montelukast (SINGULAIR) 10 MG tablet Take 1 tablet (10 mg total) by mouth at bedtime. 30 tablet 5  . promethazine (PHENERGAN) 25 MG tablet Take 1 tablet (25 mg total) by mouth every 8 (eight) hours as needed for nausea or vomiting. 20 tablet 0  . valACYclovir (VALTREX) 500 MG tablet Take 500 mg by mouth daily.    . citalopram (CELEXA) 40 MG tablet Take 1 tablet (40 mg total) by mouth daily. (Patient not taking: Reported on 03/11/2017) 90 tablet 1  . dextromethorphan-guaiFENesin (MUCINEX DM) 30-600 MG 12hr tablet Take 1 tablet by mouth 2 (two) times daily as needed for cough. (Patient not taking: Reported on 03/11/2017) 14 tablet 0  . oxyCODONE-acetaminophen (PERCOCET/ROXICET) 5-325 MG tablet Take 2 tablets by mouth every 6 (six) hours as needed for severe pain. (Patient not taking: Reported on 03/11/2017) 12  tablet 0  . ranitidine (ZANTAC) 150 MG tablet Take 1 tablet (150 mg total) by mouth at bedtime. (Patient not taking: Reported on 03/11/2017) 30 tablet 11  . simvastatin (ZOCOR) 20 MG tablet TAKE 1 TABLET BY MOUTH EVERY EVENING BEFORE BEDTIME FOR CHOLESTEROL 30 tablet 4   No current facility-administered medications on file prior to visit.     Past Medical History:  Diagnosis Date  . Acute MI ([place])    x3 - by report only. She has had 3  negative Myoview stress test with no evidence of prior infarct.  . Allergic rhinitis   . Allergy   . Anemia   . Ankle fracture 05/2016  . Anxiety   . Arthritis   . Asthma   . Barrett esophagus 2007  . Chest pain 05-02-2009   echo  EF 55%  . COPD (chronic obstructive pulmonary disease) with chronic bronchitis ([facility])    & Emphysema  . Depression   . Duodenitis without mention of hemorrhage 2007  . Esophageal reflux 2007  . Esophageal stricture   . Full dentures   . H/O hiatal hernia   . Hiatal hernia 0076,[id number]  . Hyperlipidemia   . Multiple fractures    from falls, fx rt. elbow, fx left wrist, bilateral ankles  . Pneumonia 10/2016  . Ulcer     Past Surgical History:  Procedure Laterality Date  . ABDOMINAL HYSTERECTOMY     BSO  . APPENDECTOMY    . CESAREAN SECTION     x 2  . CHOLECYSTECTOMY    . CHONDROPLASTY Left 01/06/2015   Procedure: CHONDROPLASTY;  Surgeon: Marybelle Killings, MD;  Location: [facility];  Service: Orthopedics;  Laterality: Left;  . COLONOSCOPY    . ELBOW FRACTURE SURGERY     right  . KNEE ARTHROSCOPY WITH EXCISION PLICA Left 3/35/4562   Procedure: KNEE ARTHROSCOPY WITH EXCISION PLICA;  Surgeon: Marybelle Killings, MD;  Location: [facility];  Service: Orthopedics;  Laterality: Left;  . Lower Extremity Arterial Dopplers  07/06/2013   RABI 1.0, LABI 1.1.; No evidence of significant vascular atherogenic plaque  . NECK SURGERY    . NM MYOVIEW LTD  09/2014   LOW RISK. Normal EF of 65% with no regional wall motion abnormalities. No ischemia or infarction.  . TONSILLECTOMY    . WRIST FRACTURE SURGERY     left, has plate    Family History  Problem Relation Age of Onset  . Emphysema Father   . Dementia Mother   . Diabetes Maternal Grandmother   . Diabetes Maternal Aunt   . Diabetes Unknown        mat. cousin  . Heart disease Brother   . Colon cancer Neg Hx   . Allergic rhinitis Neg Hx   . Angioedema Neg Hx   . Asthma Neg Hx   .  Atopy Neg Hx   . Eczema Neg Hx   . Immunodeficiency Neg Hx   . Urticaria Neg Hx     Social History   Social History  . Marital status: Married    Spouse name: [name]  . Number of children: 2  . Years of education: HS   Occupational History  . disable    Social History Main Topics  . Smoking status: Current Every Day Smoker    Packs/day: 1.00    Years: 39.00    Types: Cigarettes    Start date: 08/06/1977  . Smokeless tobacco: Never Used  Comment: Currently smoking 1 ppd.  Quit once for 4 months.   . Alcohol use No  . Drug use: No  . Sexual activity: No   Other Topics Concern  . None   Social History Narrative   Patient lives at home spouse [name].  Has 2 daughters (19 & 86).   Currently "disabled".   Caffeine Use: tea 4 glasses daily.    Smokes ~1 1/2 PPD  05/15/15   Walks everyday - no set routine.      [facility] (12/03/16):   Originally from [place] 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 Currently has a dog. No bird exposure. No known mold exposure.       Objective:   Physical Exam BP 114/72 (BP Location: Left Arm, Cuff Size: Normal)   Pulse 99   Ht 5\' 4"  (1.626 m)   Wt 143 lb 12.8 oz (65.2 kg)   SpO2 96%   BMI 24.68 kg/m   General:  Awake. Alert. No distress. Husband and friend with patient today. Integument:  Warm & dry. No rash on exposed skin.  Extremities:  No cyanosis or clubbing.  HEENT:  Moist mucus membranes. No oral ulcers. No scleral injection or icterus. No nasal turbinate swelling. Cardiovascular:  Regular rate. No edema. Normal S1 & S2.  Pulmonary:  Intermittent nonproductive cough witnessed. Mild apical wheeze. Good aeration bilaterally. Abdomen: Soft. Normal bowel sounds. Nondistended.  Musculoskeletal:  Normal bulk and tone. No chest wall deformity. No joint deformity or effusion appreciated.  PFT 01/31/17: FVC 1.96 L (59%) FEV1 1.31 L (51%) FEV1/FVC 0.67 FEF 25-75 0.76 L (32%) negative bronchodilator  response 09/26/16: FVC 2.37 L (86%) FEV1 1.73 L (77%) FEV1/FVC 0.72 FEF 25-75 1.27 L (54%) 06/19/16: FVC 1.77 L (61%) FEV1 1.26 L (54%) FEV1/FVC 0.71 FEF 25-75 1.01 L (41%) negative bronchodilator response  6MWT 01/31/17:  Walked 340 meters / Baseline Sat 96% on RA / Nadir Sat 95% on RA  IMAGING CT CHEST W/O 02/16/17 (personally reviewed by me):  No fracture. No opacity or nodule. No pleural effusion or thickening. No pathologic mediastinal adenopathy. No pericardial effusion. She has some dependent atelectasis.   CT CORONARY ANGIOGRAPHY 12/02/16 (previously reviewed by me): Per the radiologist patient's coronary calcium score is 0. Right dominant with normal coronary origin. No evidence of coronary artery disease. Reviewing the lung parenchyma shows some mild dependent atelectasis but no evidence of pleural thickening or pleural effusion on limited views. No pathologically enlarged mediastinal lymph nodes on limited views either. Apical predominant emphysematous changes are present.  CXR PA/LAT 11/23/16 (previously reviewed by me): No parenchymal mass or opacity appreciated. No pleural effusion. Heart normal in size & mediastinum normal in contour.  LABS 12/03/16 Alpha-1 antitrypsin: MM (173)  09/26/16 IgG: 524 IgA: 88 IgM: 72    Assessment & Plan:  61 y.o. female with mixed type COPD with emphysema, chronic seasonal allergic rhinitis, GERD, & tobacco use disorder. Patient is having frequent coughing as well as discomfort after a recent fall. I reviewed her chest CT scan which shows no fracture. We did discuss her ongoing tobacco use at length which is likely a contributing factor to her ongoing cough. Overall her reflux and allergies seem to be reasonably well controlled. I instructed the patient contact my office if she had any new breathing problems or questions before next appointment.   1. COPD mixed type with emphysema: Continuing Symbicort 80/4.5 & Singulair. Treating with prednisone  40 mg daily 4 days &  Tussionex when necessary daily at bedtime for cough suppression. 2. Chronic seasonal allergic rhinitis: Continuing to follow with allergy/immunology. Continuing Singulair. 3. GERD: Controlled with Zantac & Dexilant. No changes. 4. Tobacco use disorder: Spent over 3 minutes counseling the patient will need for complete tobacco cessation. 5. Health maintenance: Status post Pneumovax May 2015 & Tdap August 2017. 6. Follow-up: Return to clinic in 3 months or sooner if needed.  Sonia Baller Ashok Cordia, M.D. [facility] Pager:  [phone] After 3pm or if no response, call [phone] 9:09 AM 03/11/17

## 2017-03-11 NOTE — Telephone Encounter (Signed)
Called and spoke with pt and she stated that the pharmacy is charging her $98 for her cough syrup and she stated that she cannot afford this. Her insurance advised her to call our office to see if there was something else that could be given that was not so expensive.  JN please advise. Thanks   Allergies  Allergen Reactions  . Bee Venom Swelling  . Codeine Nausea Only    CAUSES ULCERS  . Ibuprofen Nausea And Vomiting  . Levofloxacin Nausea Only  . Propoxyphene N-Acetaminophen Nausea Only  . Toradol [Ketorolac Tromethamine] Nausea And Vomiting  . Ultram [Tramadol Hcl]   . Hydrocodone-Acetaminophen Nausea Only    REACTION: "sick"  . Latex Rash

## 2017-03-11 NOTE — Patient Instructions (Signed)
   Continue using your Symbicort and Albuterol rescue inhaler as prescribed.  Start using Mucinex twice daily with plenty of liquids.  You need to address your ongoing pain with your primary care doctor.  Call me if your cough doesn't improve.  I will see you back in 3 months or sooner if needed.

## 2017-03-12 DIAGNOSIS — Z79899 Other long term (current) drug therapy: Secondary | ICD-10-CM | POA: Diagnosis not present

## 2017-03-12 DIAGNOSIS — G43719 Chronic migraine without aura, intractable, without status migrainosus: Secondary | ICD-10-CM | POA: Diagnosis not present

## 2017-03-12 DIAGNOSIS — E785 Hyperlipidemia, unspecified: Secondary | ICD-10-CM | POA: Diagnosis not present

## 2017-03-12 LAB — HEPATIC FUNCTION PANEL
ALBUMIN: 4.6 g/dL (ref 3.6–4.8)
ALK PHOS: 107 IU/L (ref 39–117)
ALT: 17 IU/L (ref 0–32)
AST: 17 IU/L (ref 0–40)
BILIRUBIN TOTAL: 0.3 mg/dL (ref 0.0–1.2)
BILIRUBIN, DIRECT: 0.08 mg/dL (ref 0.00–0.40)
Total Protein: 6.6 g/dL (ref 6.0–8.5)

## 2017-03-12 LAB — LIPID PANEL
CHOLESTEROL TOTAL: 316 mg/dL — AB (ref 100–199)
Chol/HDL Ratio: 7.9 ratio — ABNORMAL HIGH (ref 0.0–4.4)
HDL: 40 mg/dL (ref >39)
TRIGLYCERIDES: 525 mg/dL — AB (ref 0–149)

## 2017-03-12 NOTE — Telephone Encounter (Signed)
Called and spoke with pts husband and he is aware of JN recs.  He will advise the pt once she gets back home.

## 2017-03-14 ENCOUNTER — Telehealth: Payer: Self-pay | Admitting: Cardiology

## 2017-03-14 NOTE — Telephone Encounter (Signed)
Patient calling and would like to know lab results.

## 2017-03-14 NOTE — Telephone Encounter (Signed)
Spoke with patient and advised labs waiting for Dr Ellyn Hack to review and will call once he has done so. Patient appreciated call back

## 2017-03-17 ENCOUNTER — Telehealth: Payer: Self-pay | Admitting: Cardiology

## 2017-03-17 NOTE — Telephone Encounter (Signed)
Lab results pending MD review. Message routed to MD/RN

## 2017-03-17 NOTE — Telephone Encounter (Signed)
New message     Pt is calling to get her test results

## 2017-03-18 NOTE — Telephone Encounter (Signed)
Pt calling again,she wants her lab results from last month please.

## 2017-03-18 NOTE — Telephone Encounter (Signed)
Returned call to patient and made aware awaiting MD review.

## 2017-03-20 ENCOUNTER — Telehealth: Payer: Self-pay | Admitting: Cardiology

## 2017-03-20 NOTE — Telephone Encounter (Signed)
New message ° ° ° ° ° ° °Calling to get lab results °

## 2017-03-20 NOTE — Telephone Encounter (Signed)
Patient called wants results of labs, informed her that he has not reviewed yet and we will try our best to have Dr Ellyn Hack to see if he can review labs before he leaves tomorrow.  Forwarding message to [employer].

## 2017-03-21 ENCOUNTER — Encounter: Payer: Self-pay | Admitting: Nurse Practitioner

## 2017-03-21 ENCOUNTER — Ambulatory Visit (INDEPENDENT_AMBULATORY_CARE_PROVIDER_SITE_OTHER): Payer: Medicare Other

## 2017-03-21 ENCOUNTER — Ambulatory Visit (INDEPENDENT_AMBULATORY_CARE_PROVIDER_SITE_OTHER): Payer: Medicare Other | Admitting: Nurse Practitioner

## 2017-03-21 VITALS — BP 118/80 | HR 82 | Temp 98.0°F | Ht 64.0 in | Wt 142.0 lb

## 2017-03-21 DIAGNOSIS — E785 Hyperlipidemia, unspecified: Secondary | ICD-10-CM | POA: Diagnosis not present

## 2017-03-21 DIAGNOSIS — Z79899 Other long term (current) drug therapy: Secondary | ICD-10-CM

## 2017-03-21 DIAGNOSIS — F411 Generalized anxiety disorder: Secondary | ICD-10-CM

## 2017-03-21 DIAGNOSIS — G609 Hereditary and idiopathic neuropathy, unspecified: Secondary | ICD-10-CM | POA: Diagnosis not present

## 2017-03-21 DIAGNOSIS — Z9114 Patient's other noncompliance with medication regimen: Secondary | ICD-10-CM

## 2017-03-21 DIAGNOSIS — J309 Allergic rhinitis, unspecified: Secondary | ICD-10-CM

## 2017-03-21 MED ORDER — ICOSAPENT ETHYL 1 G PO CAPS: 2.0000 | ORAL_CAPSULE | Freq: Two times a day (BID) | ORAL | 0 refills | Status: DC

## 2017-03-21 NOTE — Telephone Encounter (Signed)
Unfortunately, these labs did not come to my inbox for review to review. Total cholesterol has gotten worse: 316 from 201. Triglycerides are also worse up to 525 from 277. HDL is stable. LDL could not be calculated because the triglycerides being so high.  At this point I think we are forced to switch from simvastatin to rosuvastatin 40 mg, and had Lovaza 4mg  bid.   Glenetta Hew, MD

## 2017-03-21 NOTE — Patient Instructions (Addendum)
She is not able to identify her medications by name, indication, nor is she able to tell me when she taking each medications.  She is to return to office on Monday with all medications.  I also encouraged to stop tobacco use due to elevated triglycerides and peripheral neuropathy. Store vascepa capsules in freezer to minimize smell and taste

## 2017-03-21 NOTE — Telephone Encounter (Signed)
Follow up    Pt is calling to follow up Dr. Ellyn Hack going over her labs.

## 2017-03-21 NOTE — Telephone Encounter (Signed)
Spoke to patient with recommendations from Dr Ellyn Hack.  patient states primary is following lab results.  Patient states she has an appointment 03/24/17- discuss whata medication she is taking.    per Dr Ellyn Hack , recommendation are within this message if patient is taking simvastatin-- change to rosuvastatin 40 mg and add lovaza 40 mg Follow up in 6 months with lab work to be take care by primary.  Patient only need to be seen by cardiology as "prn'  Luther NP

## 2017-03-21 NOTE — Telephone Encounter (Signed)
SEE OTHER TELEPHONE NOTE 

## 2017-03-21 NOTE — Progress Notes (Signed)
Subjective:  Patient ID: Lori [name], female    DOB: 11/09/1955  Age: 61 y.o. MRN: [id number]  CC: Follow-up (follow up--still nervouse, sleeping issue and leg muscle tight, congestion/)   HPI  Anxiety: Not taking celexa as prescribed  Claudication: Tobacco use and not taking gabapentin as prescribed. Pain with ambulation.  Elevated triglyceride: Not taking zetia or zocor as prescribed. She is hesitant about taking fish oil capsules again due to size, cost, smell and bad taste.  Arterial duplex done 2014: normal  Outpatient Medications Prior to Visit  Medication Sig Dispense Refill  . albuterol (PROVENTIL HFA;VENTOLIN HFA) 108 (90 Base) MCG/ACT inhaler Inhale 2 puffs into the lungs every 6 (six) hours as needed for wheezing or shortness of breath. 1 Inhaler 3  . budesonide-formoterol (SYMBICORT) 80-4.5 MCG/ACT inhaler Inhale 2 puffs into the lungs 2 (two) times daily. 1 Inhaler 6  . citalopram (CELEXA) 40 MG tablet Take 1 tablet (40 mg total) by mouth daily. 90 tablet 1  . dexlansoprazole (DEXILANT) 60 MG capsule Take 1 capsule (60 mg total) by mouth daily. 30 capsule 6  . dextromethorphan-guaiFENesin (MUCINEX DM) 30-600 MG 12hr tablet Take 1 tablet by mouth 2 (two) times daily as needed for cough. 14 tablet 0  . EPINEPHrine 0.3 mg/0.3 mL IJ SOAJ injection Use as directed for severe allergic reaction 2 Device 1  . fenofibrate (TRICOR) 145 MG tablet     . gabapentin (NEURONTIN) 600 MG tablet Take 600 mg by mouth 2 (two) times daily.  2  . montelukast (SINGULAIR) 10 MG tablet Take 1 tablet (10 mg total) by mouth at bedtime. 30 tablet 5  . nitroGLYCERIN (NITROSTAT) 0.4 MG SL tablet PLACE 1 TABLET UNDER THE TONGUE Q 5 MINUTES PRF CHEST PAIN  1  . predniSONE (DELTASONE) 20 MG tablet Take 2 tablets (40 mg total) by mouth daily with breakfast. 8 tablet 0  . promethazine (PHENERGAN) 25 MG tablet Take 1 tablet (25 mg total) by mouth every 8 (eight) hours as needed for nausea or  vomiting. 20 tablet 0  . ranitidine (ZANTAC) 150 MG tablet Take 1 tablet (150 mg total) by mouth at bedtime. 30 tablet 11  . valACYclovir (VALTREX) 500 MG tablet Take 500 mg by mouth daily.    Marland Kitchen oxyCODONE-acetaminophen (PERCOCET/ROXICET) 5-325 MG tablet Take 2 tablets by mouth every 6 (six) hours as needed for severe pain. 12 tablet 0  . simvastatin (ZOCOR) 20 MG tablet TAKE 1 TABLET BY MOUTH EVERY EVENING BEFORE BEDTIME FOR CHOLESTEROL 30 tablet 4  . chlorpheniramine-HYDROcodone (TUSSIONEX PENNKINETIC ER) 10-8 MG/5ML SUER Take 5 mLs by mouth at bedtime as needed for cough. (Patient not taking: Reported on 03/21/2017) 115 mL 0   No facility-administered medications prior to visit.     ROS Review of Systems  Constitutional: Negative for malaise/fatigue.  Respiratory: Positive for cough and shortness of breath.   Cardiovascular: Positive for claudication. Negative for chest pain and leg swelling.  Musculoskeletal: Negative for falls.  Skin: Negative.   Neurological: Positive for tingling and tremors. Negative for weakness.  Psychiatric/Behavioral: Positive for depression. Negative for hallucinations, substance abuse and suicidal ideas. The patient is nervous/anxious.     Objective:  BP 118/80   Pulse 82   Temp 98 F (36.7 C)   Ht 5\' 4"  (1.626 m)   Wt 142 lb (64.4 kg)   SpO2 95%   BMI 24.37 kg/m   BP Readings from Last 3 Encounters:  03/24/17 114/80  03/21/17 118/80  03/11/17  114/72    Wt Readings from Last 3 Encounters:  03/24/17 142 lb (64.4 kg)  03/21/17 142 lb (64.4 kg)  03/11/17 143 lb 12.8 oz (65.2 kg)    Physical Exam  Constitutional: She is oriented to person, place, and time.  Neck: Normal range of motion. Neck supple. No thyromegaly present.  Cardiovascular: Normal rate, regular rhythm and normal heart sounds.   Diminished distal pulses.  Pulmonary/Chest: Effort normal and breath sounds normal.  Musculoskeletal: Normal range of motion. She exhibits no edema.    Diminished microfilament and vibration sensation on bilateral feet. Skin dry and intact. No callus. No hypertrophy of toenails.  Neurological: She is alert and oriented to person, place, and time.  Vitals reviewed.   Lab Results  Component Value Date   WBC 10.9 (H) 10/27/2016   HGB 10.9 (L) 10/27/2016   HCT 32.4 (L) 10/27/2016   PLT 268 10/27/2016   GLUCOSE 88 11/20/2016   CHOL 316 (H) 03/12/2017   TRIG 525 (H) 03/12/2017   HDL 40 03/12/2017   LDLDIRECT 187.0 10/21/2016   LDLCALC Comment 03/12/2017   ALT 17 03/12/2017   AST 17 03/12/2017   NA 139 11/20/2016   K 4.3 11/20/2016   CL 101 11/20/2016   CREATININE 0.83 11/20/2016   BUN 6 (L) 11/20/2016   CO2 23 11/20/2016   TSH 1.74 06/21/2016   INR 0.9 08/31/2008   HGBA1C 5.5 06/21/2016    No results found.  Assessment & Plan:   Lori [name] was seen today for follow-up.  Diagnoses and all orders for this visit:  Anxiety state  Peripheral neuropathy, idiopathic  Hyperlipidemia LDL goal <100 -     Discontinue: Icosapent Ethyl (VASCEPA) 1 g CAPS; Take 2 capsules by mouth 2 (two) times daily after a meal.  Medication management  Non compliance w medication regimen   I am having Lori [name] maintain her EPINEPHrine, valACYclovir, albuterol, dexlansoprazole, fenofibrate, montelukast, dextromethorphan-guaiFENesin, budesonide-formoterol, gabapentin, citalopram, promethazine, nitroGLYCERIN, predniSONE, chlorpheniramine-HYDROcodone, ranitidine, and zonisamide.  Meds ordered this encounter  Medications  . zonisamide (ZONEGRAN) 25 MG capsule    Sig: TK 4 CS PO QD HS    Refill:  1  . DISCONTD: Icosapent Ethyl (VASCEPA) 1 g CAPS    Sig: Take 2 capsules by mouth 2 (two) times daily after a meal.    Dispense:  16 capsule    Refill:  0    Order Specific Question:   Supervising Provider    Answer:   Cassandria Anger [[id number]]    Follow-up: No Follow-up on file.  Wilfred Lacy, NP

## 2017-03-24 ENCOUNTER — Ambulatory Visit (INDEPENDENT_AMBULATORY_CARE_PROVIDER_SITE_OTHER): Payer: Medicare Other | Admitting: Nurse Practitioner

## 2017-03-24 ENCOUNTER — Encounter: Payer: Self-pay | Admitting: Nurse Practitioner

## 2017-03-24 ENCOUNTER — Other Ambulatory Visit: Payer: Self-pay | Admitting: Nurse Practitioner

## 2017-03-24 VITALS — BP 114/80 | HR 97 | Temp 97.8°F | Ht 64.0 in | Wt 142.0 lb

## 2017-03-24 DIAGNOSIS — M62830 Muscle spasm of back: Secondary | ICD-10-CM | POA: Diagnosis not present

## 2017-03-24 DIAGNOSIS — F411 Generalized anxiety disorder: Secondary | ICD-10-CM | POA: Diagnosis not present

## 2017-03-24 DIAGNOSIS — R42 Dizziness and giddiness: Secondary | ICD-10-CM | POA: Diagnosis not present

## 2017-03-24 DIAGNOSIS — Z79899 Other long term (current) drug therapy: Secondary | ICD-10-CM | POA: Diagnosis not present

## 2017-03-24 DIAGNOSIS — E781 Pure hyperglyceridemia: Secondary | ICD-10-CM

## 2017-03-24 MED ORDER — OMEGA-3-ACID ETHYL ESTERS 1 G PO CAPS: 2.0000 g | ORAL_CAPSULE | Freq: Two times a day (BID) | ORAL | 3 refills | Status: DC

## 2017-03-24 MED ORDER — ROSUVASTATIN CALCIUM 40 MG PO TABS: 40.0000 mg | ORAL_TABLET | Freq: Every day | ORAL | 1 refills | Status: DC

## 2017-03-24 NOTE — Patient Instructions (Signed)
I reviewed and updated her medications. I separated current medications from old medications. I instructed Lori [name] to take old medications to police [facility] to be discarded. I updated her medication list and provided patient with a copy.

## 2017-03-24 NOTE — Progress Notes (Signed)
Subjective:  Patient ID: Lori [name], female    DOB: August 11, 1956  Age: 61 y.o. MRN: [id number]  CC: Follow-up (med f/u)   HPI Lori [name] is here with all medications for clarification. She is confused as to what she is suppose to take and why is is taking medications. She has a medication dispenser but does not use it.  She denies any acute complains today.  Outpatient Medications Prior to Visit  Medication Sig Dispense Refill  . albuterol (PROVENTIL HFA;VENTOLIN HFA) 108 (90 Base) MCG/ACT inhaler Inhale 2 puffs into the lungs every 6 (six) hours as needed for wheezing or shortness of breath. 1 Inhaler 3  . budesonide-formoterol (SYMBICORT) 80-4.5 MCG/ACT inhaler Inhale 2 puffs into the lungs 2 (two) times daily. 1 Inhaler 6  . chlorpheniramine-HYDROcodone (TUSSIONEX PENNKINETIC ER) 10-8 MG/5ML SUER Take 5 mLs by mouth at bedtime as needed for cough. (Patient not taking: Reported on 03/21/2017) 115 mL 0  . citalopram (CELEXA) 40 MG tablet Take 1 tablet (40 mg total) by mouth daily. 90 tablet 1  . dexlansoprazole (DEXILANT) 60 MG capsule Take 1 capsule (60 mg total) by mouth daily. 30 capsule 6  . dextromethorphan-guaiFENesin (MUCINEX DM) 30-600 MG 12hr tablet Take 1 tablet by mouth 2 (two) times daily as needed for cough. 14 tablet 0  . EPINEPHrine 0.3 mg/0.3 mL IJ SOAJ injection Use as directed for severe allergic reaction 2 Device 1  . fenofibrate (TRICOR) 145 MG tablet     . gabapentin (NEURONTIN) 600 MG tablet Take 600 mg by mouth 2 (two) times daily.  2  . montelukast (SINGULAIR) 10 MG tablet Take 1 tablet (10 mg total) by mouth at bedtime. 30 tablet 5  . nitroGLYCERIN (NITROSTAT) 0.4 MG SL tablet PLACE 1 TABLET UNDER THE TONGUE Q 5 MINUTES PRF CHEST PAIN  1  . predniSONE (DELTASONE) 20 MG tablet Take 2 tablets (40 mg total) by mouth daily with breakfast. 8 tablet 0  . promethazine (PHENERGAN) 25 MG tablet Take 1 tablet (25 mg total) by mouth every 8 (eight) hours as needed for  nausea or vomiting. 20 tablet 0  . ranitidine (ZANTAC) 150 MG tablet Take 1 tablet (150 mg total) by mouth at bedtime. 30 tablet 11  . valACYclovir (VALTREX) 500 MG tablet Take 500 mg by mouth daily.    Marland Kitchen zonisamide (ZONEGRAN) 25 MG capsule TK 4 CS PO QD HS  1  . Icosapent Ethyl (VASCEPA) 1 g CAPS Take 2 capsules by mouth 2 (two) times daily after a meal. 16 capsule 0  . oxyCODONE-acetaminophen (PERCOCET/ROXICET) 5-325 MG tablet Take 2 tablets by mouth every 6 (six) hours as needed for severe pain. 12 tablet 0  . simvastatin (ZOCOR) 20 MG tablet TAKE 1 TABLET BY MOUTH EVERY EVENING BEFORE BEDTIME FOR CHOLESTEROL 30 tablet 4   No facility-administered medications prior to visit.     ROS See HPI  Objective:  BP 114/80   Pulse 97   Temp 97.8 F (36.6 C)   Ht 5\' 4"  (1.626 m)   Wt 142 lb (64.4 kg)   SpO2 98%   BMI 24.37 kg/m   BP Readings from Last 3 Encounters:  03/24/17 114/80  03/21/17 118/80  03/11/17 114/72    Wt Readings from Last 3 Encounters:  03/24/17 142 lb (64.4 kg)  03/21/17 142 lb (64.4 kg)  03/11/17 143 lb 12.8 oz (65.2 kg)    Physical Exam  Constitutional: She is oriented to person, place, and time. No distress.  Cardiovascular: Normal rate.   Pulmonary/Chest: Effort normal.  Musculoskeletal: She exhibits no edema.  Neurological: She is alert and oriented to person, place, and time.  Skin: Skin is warm and dry.  Vitals reviewed.   Lab Results  Component Value Date   WBC 10.9 (H) 10/27/2016   HGB 10.9 (L) 10/27/2016   HCT 32.4 (L) 10/27/2016   PLT 268 10/27/2016   GLUCOSE 88 11/20/2016   CHOL 316 (H) 03/12/2017   TRIG 525 (H) 03/12/2017   HDL 40 03/12/2017   LDLDIRECT 187.0 10/21/2016   LDLCALC Comment 03/12/2017   ALT 17 03/12/2017   AST 17 03/12/2017   NA 139 11/20/2016   K 4.3 11/20/2016   CL 101 11/20/2016   CREATININE 0.83 11/20/2016   BUN 6 (L) 11/20/2016   CO2 23 11/20/2016   TSH 1.74 06/21/2016   INR 0.9 08/31/2008   HGBA1C 5.5  06/21/2016    No results found.  Assessment & Plan:   Lori [name] was seen today for follow-up.  Diagnoses and all orders for this visit:  Anxiety state  Pure hyperglyceridemia -     rosuvastatin (CRESTOR) 40 MG tablet; Take 1 tablet (40 mg total) by mouth daily. -     omega-3 acid ethyl esters (LOVAZA) 1 g capsule; Take 2 capsules (2 g total) by mouth 2 (two) times daily.  Vertigo  Spasm of muscle of lower back   I have discontinued Lori [name]'s simvastatin, oxyCODONE-acetaminophen, and Icosapent Ethyl. I am also having her start on rosuvastatin and omega-3 acid ethyl esters. Additionally, I am having her maintain her EPINEPHrine, valACYclovir, albuterol, dexlansoprazole, fenofibrate, montelukast, dextromethorphan-guaiFENesin, budesonide-formoterol, gabapentin, citalopram, promethazine, nitroGLYCERIN, predniSONE, chlorpheniramine-HYDROcodone, ranitidine, zonisamide, cyclobenzaprine, and meclizine.  Meds ordered this encounter  Medications  . rosuvastatin (CRESTOR) 40 MG tablet    Sig: Take 1 tablet (40 mg total) by mouth daily.    Dispense:  90 tablet    Refill:  1    Order Specific Question:   Supervising Provider    Answer:   Cassandria Anger [[id number]]  . omega-3 acid ethyl esters (LOVAZA) 1 g capsule    Sig: Take 2 capsules (2 g total) by mouth 2 (two) times daily.    Dispense:  120 capsule    Refill:  3    Order Specific Question:   Supervising Provider    Answer:   Cassandria Anger [[id number]]  . cyclobenzaprine (FLEXERIL) 5 MG tablet    Sig: Take 1 tablet (5 mg total) by mouth 3 (three) times daily as needed for muscle spasms.    Dispense:  30 tablet    Refill:  1    Order Specific Question:   Supervising Provider    Answer:   Cassandria Anger [[id number]]  . meclizine (ANTIVERT) 12.5 MG tablet    Sig: Take 1 tablet (12.5 mg total) by mouth 2 (two) times daily as needed for dizziness (do not use for more than 3days).    Dispense:  6 tablet    Refill:  0    Order  Specific Question:   Supervising Provider    Answer:   Cassandria Anger [[id number]]    Follow-up: Return as scheduled.  Wilfred Lacy, NP

## 2017-03-31 ENCOUNTER — Telehealth: Payer: Self-pay | Admitting: Nurse Practitioner

## 2017-03-31 NOTE — Telephone Encounter (Signed)
FYI

## 2017-03-31 NOTE — Telephone Encounter (Signed)
Patient called back.  I gave her [place]es response.  Patient stated she understood.  I asked patient if she would like to be transferred to GI to speak with them and patient refused at this time.

## 2017-03-31 NOTE — Telephone Encounter (Signed)
Left vm for pt to call back. Need to inform pt of massage below.

## 2017-03-31 NOTE — Telephone Encounter (Signed)
Received massage from over night caller nurse, stating Lori [name] call stating she still having a heartburn even when she is taking twice a day.    Per [name]--pt need to take this Dexilant in morning once a day before breakfast and Ranitidine at night. Pt need to contact GI (they are managing this).

## 2017-04-11 ENCOUNTER — Telehealth: Payer: Self-pay | Admitting: Nurse Practitioner

## 2017-04-11 DIAGNOSIS — M62830 Muscle spasm of back: Secondary | ICD-10-CM

## 2017-04-14 NOTE — Telephone Encounter (Signed)
Patient is following up on the request for this medication. Please advise. Thank you.

## 2017-04-16 NOTE — Telephone Encounter (Signed)
Patient has called in.  States that her pharmacy has told her there is not a refill left on this medication.  Please check quantity of tablets vs. Frequency taken per day.   Would this be a 10 day script?

## 2017-04-17 ENCOUNTER — Ambulatory Visit (INDEPENDENT_AMBULATORY_CARE_PROVIDER_SITE_OTHER): Payer: Medicare Other | Admitting: *Deleted

## 2017-04-17 DIAGNOSIS — J309 Allergic rhinitis, unspecified: Secondary | ICD-10-CM

## 2017-04-17 MED ORDER — CYCLOBENZAPRINE HCL 5 MG PO TABS: 5.0000 mg | ORAL_TABLET | Freq: Two times a day (BID) | ORAL | 1 refills | Status: DC | PRN

## 2017-04-17 NOTE — Addendum Note (Signed)
Addended by: Leana Gamer on: 04/17/2017 03:49 PM   Modules accepted: Orders

## 2017-04-17 NOTE — Telephone Encounter (Signed)
Please advise 

## 2017-04-21 ENCOUNTER — Ambulatory Visit: Payer: Medicare Other | Admitting: Family Medicine

## 2017-04-22 ENCOUNTER — Telehealth: Payer: Self-pay | Admitting: Nurse Practitioner

## 2017-04-22 ENCOUNTER — Ambulatory Visit: Payer: Medicare Other | Admitting: Nurse Practitioner

## 2017-04-22 ENCOUNTER — Telehealth: Payer: Self-pay | Admitting: Pulmonary Disease

## 2017-04-22 DIAGNOSIS — K2100 Gastro-esophageal reflux disease with esophagitis, without bleeding: Secondary | ICD-10-CM

## 2017-04-22 MED ORDER — DEXLANSOPRAZOLE 60 MG PO CPDR: 60.0000 mg | DELAYED_RELEASE_CAPSULE | Freq: Every day | ORAL | 0 refills | Status: DC

## 2017-04-22 NOTE — Telephone Encounter (Signed)
Sure. That is fine.

## 2017-04-22 NOTE — Telephone Encounter (Signed)
Left message for patient that we have sent 90 day Rx to pharmacy to last until seen next. Nothing more needed at this time.

## 2017-04-22 NOTE — Telephone Encounter (Signed)
Pt states she was suppose to have gotten 90 day Rx doe Dexilant-instead only got 30 day Rx. Pt is aware that Rx was sent that way 11-2016. Pt does not have enough refills on hand to create a 90 day Rx prior to being seen by JN. Will forward to JN to see if we an give 90 day Rx.

## 2017-04-22 NOTE — Telephone Encounter (Signed)
Pt states she is out of valACYclovir (VALTREX) 500 MG tablet  And would like a refill, [facility] on [address] Please advise  Last med Fu was 03/24/17

## 2017-04-22 NOTE — Telephone Encounter (Signed)
Ok to send 30tabs with 1refill

## 2017-04-23 MED ORDER — VALACYCLOVIR HCL 500 MG PO TABS: 500.0000 mg | ORAL_TABLET | Freq: Every day | ORAL | 1 refills | Status: DC

## 2017-04-23 NOTE — Telephone Encounter (Signed)
Rx sent to pharmacy   

## 2017-05-04 IMAGING — CR DG KNEE AP/LAT W/ SUNRISE*L*
3 series · 3 of 3 positions shown · non-contrast
Comparison: 10/24/2014

CLINICAL DATA: Left knee pain, no known injury, initial encounter

EXAM:
LEFT KNEE 3 VIEWS

[t knee ap left]
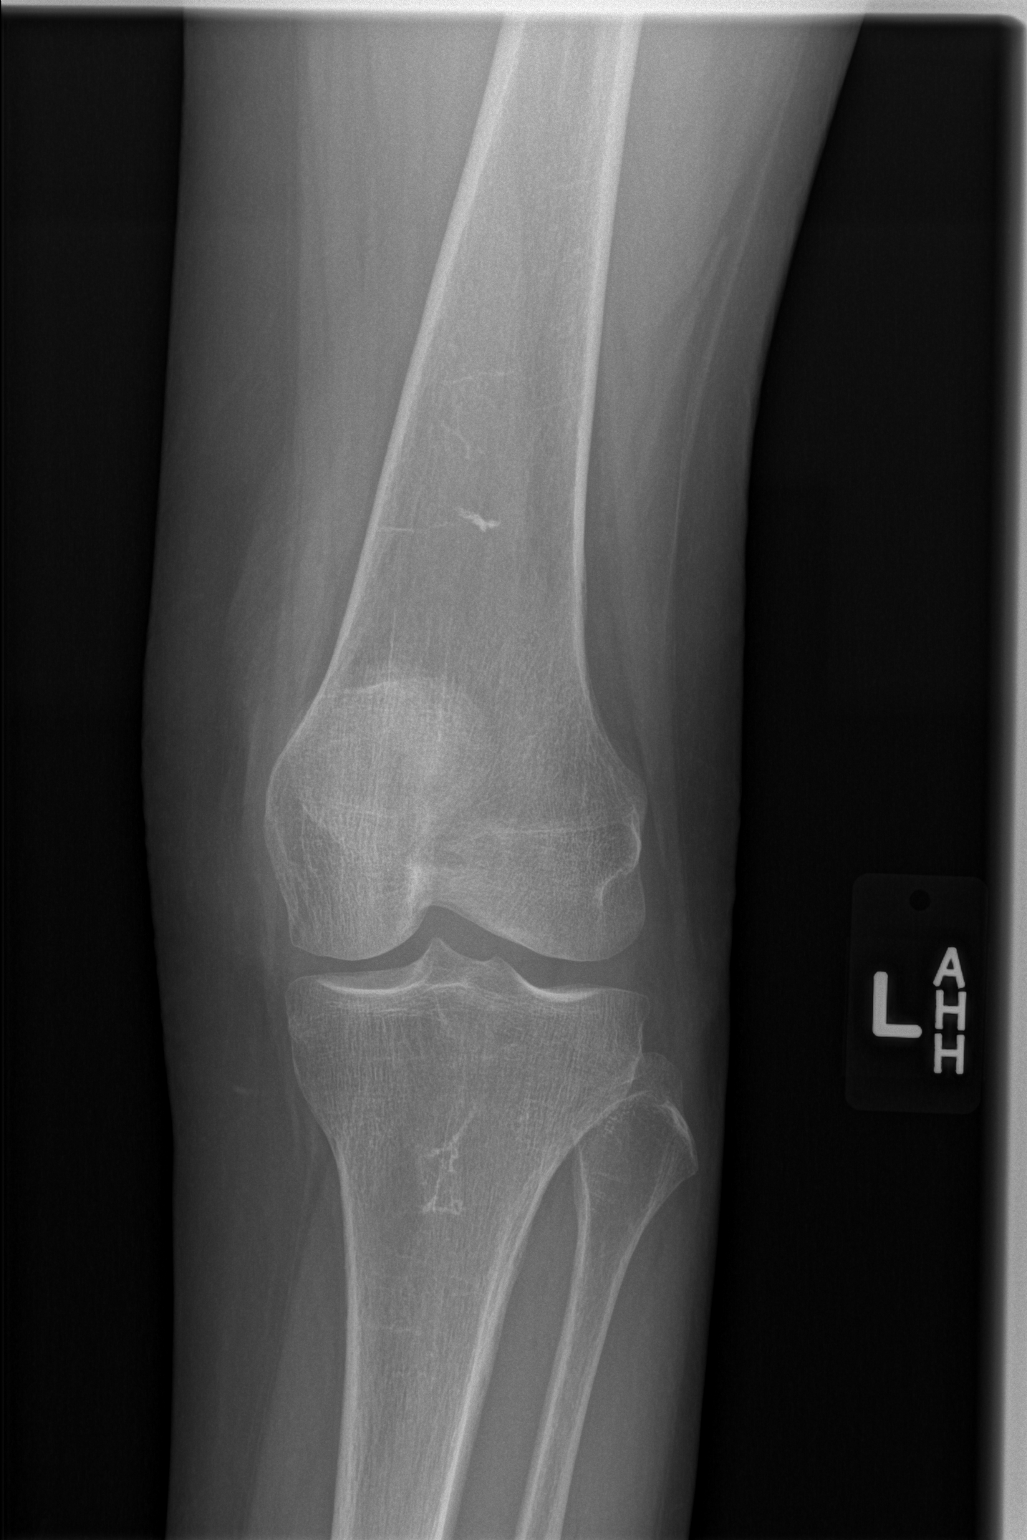

[t knee lat left]
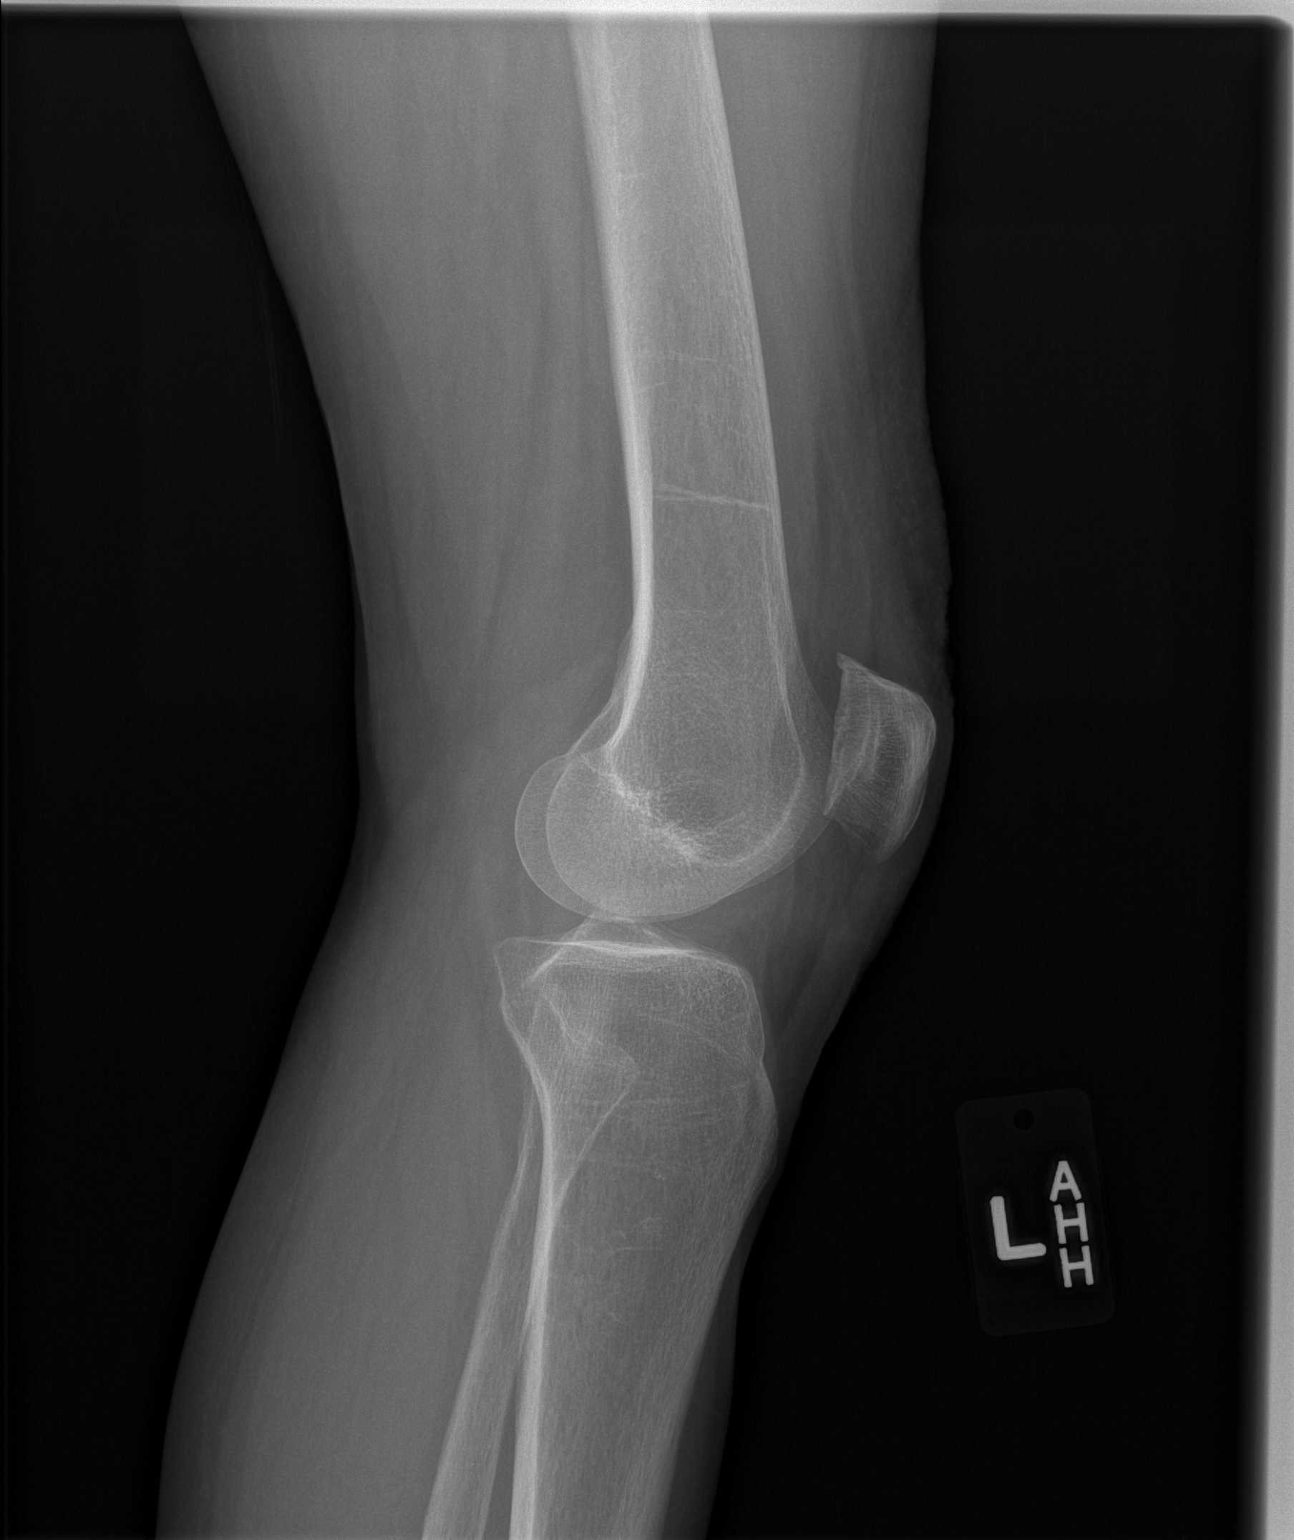

[x knee ap left]
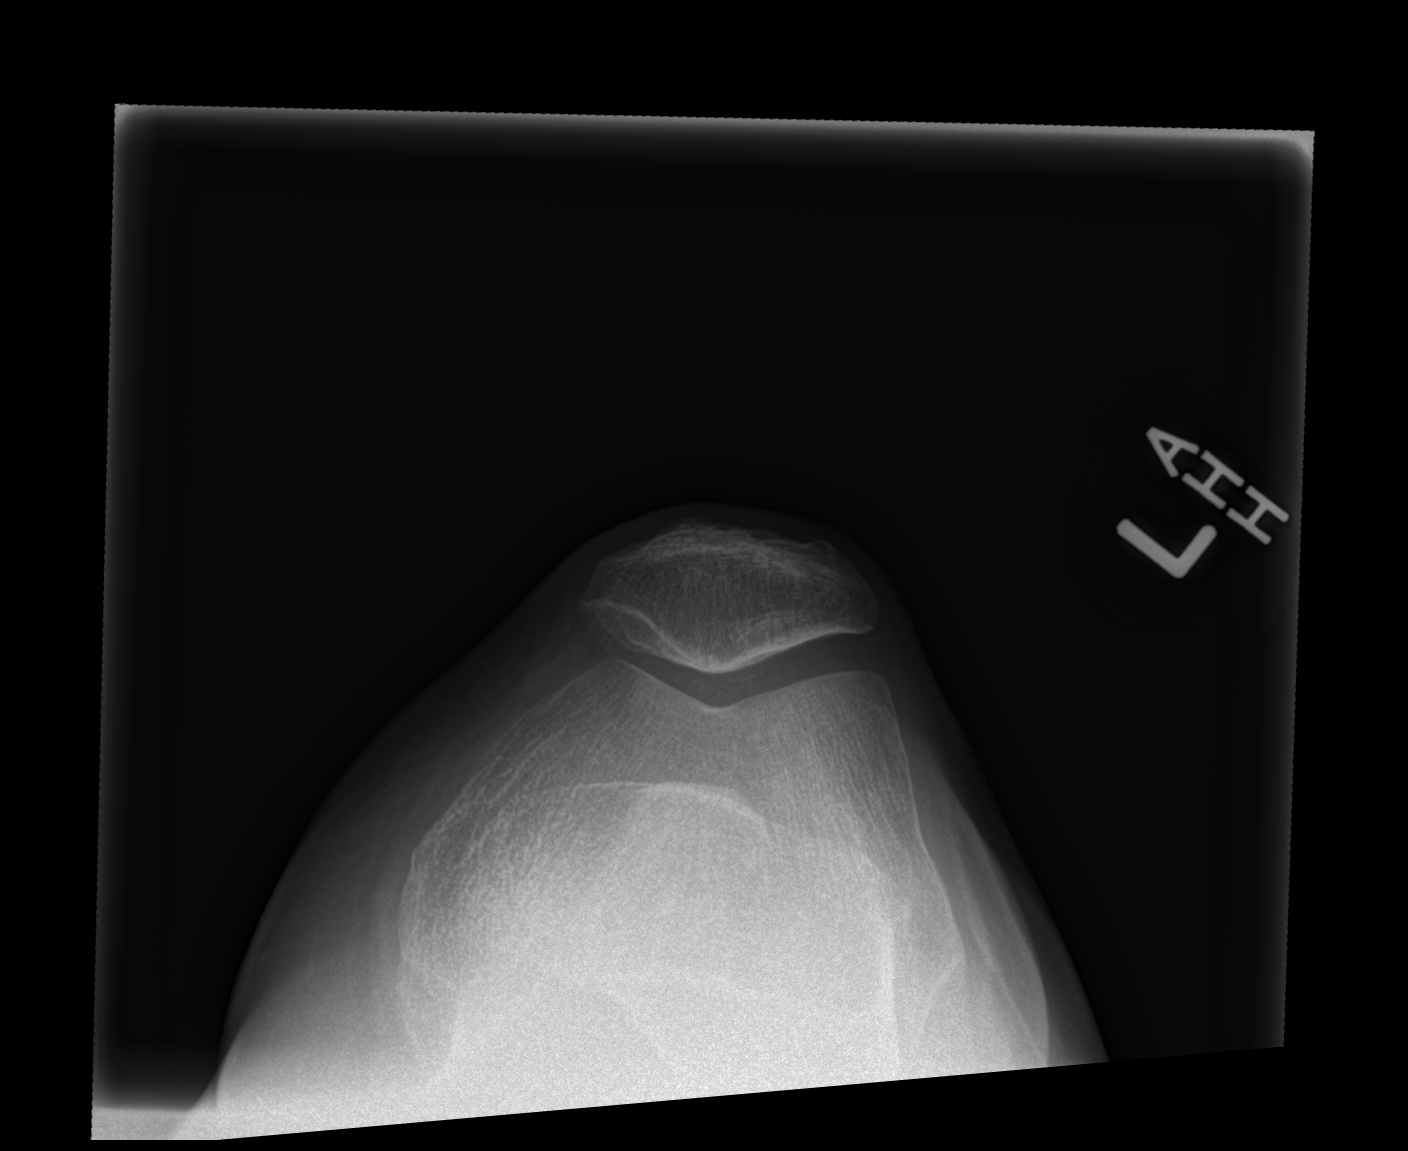

[3 of 3 positions shown; findings below may reference images not displayed]

FINDINGS: No acute fracture or dislocation is noted. Very mild patellofemoral
spurring is seen. No joint effusion is noted.
IMPRESSION: Mild degenerative change in the patellofemoral space. No acute
abnormality is noted.

## 2017-05-07 ENCOUNTER — Telehealth: Payer: Self-pay | Admitting: Nurse Practitioner

## 2017-05-07 ENCOUNTER — Ambulatory Visit: Payer: Medicare Other | Admitting: Allergy & Immunology

## 2017-05-07 NOTE — Telephone Encounter (Signed)
Scheduled pt 05/23/17 w/ [place].  Patient requested to be scheduled that far out.

## 2017-05-07 NOTE — Telephone Encounter (Signed)
Please call pt and offer an appt in order for [place] to treat her symptoms.

## 2017-05-07 NOTE — Telephone Encounter (Signed)
Pt called in and said that she is coughing up yellow mucus.  Her friend who brings her was in a car accident and she said that she has no way to get here.  I told her she may have to have a OV.

## 2017-05-07 NOTE — Telephone Encounter (Signed)
LM with pt informing her that she will need an appointment.

## 2017-05-09 DIAGNOSIS — G43719 Chronic migraine without aura, intractable, without status migrainosus: Secondary | ICD-10-CM | POA: Diagnosis not present

## 2017-05-14 ENCOUNTER — Ambulatory Visit: Payer: Medicare Other

## 2017-05-16 ENCOUNTER — Encounter: Payer: Self-pay | Admitting: Allergy

## 2017-05-16 ENCOUNTER — Encounter (INDEPENDENT_AMBULATORY_CARE_PROVIDER_SITE_OTHER): Payer: Self-pay

## 2017-05-16 ENCOUNTER — Ambulatory Visit (INDEPENDENT_AMBULATORY_CARE_PROVIDER_SITE_OTHER): Payer: Medicare Other | Admitting: Allergy

## 2017-05-16 VITALS — BP 104/62 | HR 89 | Resp 18

## 2017-05-16 DIAGNOSIS — J309 Allergic rhinitis, unspecified: Secondary | ICD-10-CM | POA: Diagnosis not present

## 2017-05-16 DIAGNOSIS — J4541 Moderate persistent asthma with (acute) exacerbation: Secondary | ICD-10-CM | POA: Diagnosis not present

## 2017-05-16 MED ORDER — MONTELUKAST SODIUM 10 MG PO TABS: 10.0000 mg | ORAL_TABLET | Freq: Every day | ORAL | 5 refills | Status: DC

## 2017-05-16 MED ORDER — AMOXICILLIN-POT CLAVULANATE 875-125 MG PO TABS: 1.0000 | ORAL_TABLET | Freq: Two times a day (BID) | ORAL | 0 refills | Status: DC

## 2017-05-16 MED ORDER — FLUTICASONE FUROATE-VILANTEROL 200-25 MCG/INH IN AEPB: 1.0000 | INHALATION_SPRAY | Freq: Every day | RESPIRATORY_TRACT | 5 refills | Status: DC

## 2017-05-16 MED ORDER — ALBUTEROL SULFATE HFA 108 (90 BASE) MCG/ACT IN AERS: INHALATION_SPRAY | RESPIRATORY_TRACT | 2 refills | Status: DC

## 2017-05-16 MED ORDER — PREDNISONE 10 MG PO TABS: 30.0000 mg | ORAL_TABLET | Freq: Every day | ORAL | 0 refills | Status: DC

## 2017-05-16 NOTE — Patient Instructions (Addendum)
1. Moderate persistent asthma, with exacerbation  - It is very important that you take your Breo once daily to prevent flares. - Daily controller medication(s): Breo 258mcg 1 inhalation daily - Rescue medications: ProAir 2-4 puffs every 4-6 hours as needed or albuterol nebulizer one vial puffs every 4-6 hours as needed - take prednisone 30mg  daily x 5 days - take Augmentin 875mg  twice a day x 10 days - Asthma control goals:  * Full participation in all desired activities (may need albuterol before activity) * Albuterol use two time or less a week on average (not counting use with activity) * Cough interfering with sleep two time or less a month * Oral steroids no more than once a year * No hospitalizations  2. Perennial allergic rhinitis - Continue with Nasacort 2 sprays per nostril daily. - Continue with Singulair 10mg  daily. - Continue with cetirizine (Zyrtec) 10mg  daily to help with the sneezing and other allergy symptoms.  - Continue allergy shots at our clinic.   4. Return in about 4 months (around 05/03/2017).

## 2017-05-16 NOTE — Progress Notes (Signed)
Follow-up Note  RE: Lori [name] MRN: [id number] DOB: 29-Dec-1955 Date of Office Visit: 05/16/2017   History of present illness: Lori [name] is a 61 y.o. female presenting today for sick visit.  She has history of asthma, allergic rhinitis and is a smoker.  She presents today with her husband.  She was last seen in the office on 01/01/17 by Dr. Ernst Bowler.  She states for the last 4 weeks she has been having chest congestion with yellow mucus production.  She states the phlegm has been thick where she can't get it up to cough/spit it out.  She feels she has an infection.  She is worried as she had PNA in February and states she was hospitalized.  She reports increased cough and wheezing and SOB.   She is waking up from sleep with symptoms.  She reports she has been using her Albuterol 2 puffs twice a day with symptoms but she ran out 2-3 days ago.  She is supposed to be on Arnuity and states she uses 1 puff twice a day but then points on the inhaler map that she has Symbicort.  She states the inhalers she has used up to this point have not been helpful for her.   She does continue on Singulair but she ran out of this 2 days ago as well.   She states she has not been using Nasacort or Zyrtec.  She is on AIT.     Review of systems: Review of Systems  Constitutional: Negative for chills, fever and malaise/fatigue.  HENT: Positive for congestion. Negative for ear discharge, ear pain, nosebleeds, sinus pain and sore throat.   Eyes: Negative for pain and redness.  Cardiovascular: Positive for palpitations.  Skin: Negative for itching and rash.    All other systems negative unless noted above in HPI  Past medical/social/surgical/family history have been reviewed and are unchanged unless specifically indicated below.  No changes  Medication List: Allergies as of 05/16/2017      Reactions   Bee Venom Swelling   Codeine Nausea Only   CAUSES ULCERS   Ibuprofen Nausea And Vomiting   Levofloxacin Nausea Only   Propoxyphene N-acetaminophen Nausea Only   Toradol [ketorolac Tromethamine] Nausea And Vomiting   Ultram [tramadol Hcl]    Hydrocodone-acetaminophen Nausea Only   REACTION: "sick"   Latex Rash      Medication List       Accurate as of 05/16/17  4:31 PM. Always use your most recent med list.          albuterol 108 (90 Base) MCG/ACT inhaler Commonly known as:  PROVENTIL HFA;VENTOLIN HFA Inhale 2-4 puffs as needed for chest tightness, wheezing or cough.   amoxicillin-clavulanate 875-125 MG tablet Commonly known as:  AUGMENTIN Take 1 tablet by mouth 2 (two) times daily.   citalopram 40 MG tablet Commonly known as:  CELEXA Take 1 tablet (40 mg total) by mouth daily.   cyclobenzaprine 5 MG tablet Commonly known as:  FLEXERIL Take 1 tablet (5 mg total) by mouth every 12 (twelve) hours as needed for muscle spasms.   dexlansoprazole 60 MG capsule Commonly known as:  DEXILANT Take 1 capsule (60 mg total) by mouth daily.   dextromethorphan-guaiFENesin 30-600 MG 12hr tablet Commonly known as:  MUCINEX DM Take 1 tablet by mouth 2 (two) times daily as needed for cough.   EPINEPHrine 0.3 mg/0.3 mL Soaj injection Commonly known as:  EPI-PEN Use as directed for severe allergic reaction  fenofibrate 145 MG tablet Commonly known as:  TRICOR   fluticasone furoate-vilanterol 200-25 MCG/INH Aepb Commonly known as:  BREO ELLIPTA Inhale 1 puff into the lungs daily.   gabapentin 600 MG tablet Commonly known as:  NEURONTIN Take 600 mg by mouth 2 (two) times daily.   meclizine 12.5 MG tablet Commonly known as:  ANTIVERT Take 1 tablet (12.5 mg total) by mouth 2 (two) times daily as needed for dizziness (do not use for more than 3days).   montelukast 10 MG tablet Commonly known as:  SINGULAIR Take 1 tablet (10 mg total) by mouth at bedtime.   nitroGLYCERIN 0.4 MG SL tablet Commonly known as:  NITROSTAT PLACE 1 TABLET UNDER THE TONGUE Q 5 MINUTES PRF  CHEST PAIN   omega-3 acid ethyl esters 1 g capsule Commonly known as:  LOVAZA Take 2 capsules (2 g total) by mouth 2 (two) times daily.   predniSONE 10 MG tablet Commonly known as:  DELTASONE Take 3 tablets (30 mg total) by mouth daily with breakfast.   promethazine 25 MG tablet Commonly known as:  PHENERGAN Take 1 tablet (25 mg total) by mouth every 8 (eight) hours as needed for nausea or vomiting.   ranitidine 150 MG tablet Commonly known as:  ZANTAC Take 1 tablet (150 mg total) by mouth at bedtime.   rosuvastatin 40 MG tablet Commonly known as:  CRESTOR Take 1 tablet (40 mg total) by mouth daily.   valACYclovir 500 MG tablet Commonly known as:  VALTREX Take 1 tablet (500 mg total) by mouth daily.   zonisamide 25 MG capsule Commonly known as:  ZONEGRAN TK 4 CS PO QD HS            Discharge Care Instructions        Start     Ordered   05/16/17 0000  Spirometry with Graph    Question Answer Comment  Where should this test be performed? Other   Basic spirometry Yes   Spirometry pre & post bronchodilator No      05/16/17 1340   05/16/17 0000  montelukast (SINGULAIR) 10 MG tablet  Daily at bedtime     05/16/17 1340   05/16/17 0000  albuterol (PROVENTIL HFA;VENTOLIN HFA) 108 (90 Base) MCG/ACT inhaler     05/16/17 1340   05/16/17 0000  fluticasone furoate-vilanterol (BREO ELLIPTA) 200-25 MCG/INH AEPB  Daily     05/16/17 1340   05/16/17 0000  predniSONE (DELTASONE) 10 MG tablet  Daily with breakfast     05/16/17 1340   05/16/17 0000  amoxicillin-clavulanate (AUGMENTIN) 875-125 MG tablet  2 times daily     05/16/17 1340      Known medication allergies: Allergies  Allergen Reactions  . Bee Venom Swelling  . Codeine Nausea Only    CAUSES ULCERS  . Ibuprofen Nausea And Vomiting  . Levofloxacin Nausea Only  . Propoxyphene N-Acetaminophen Nausea Only  . Toradol [Ketorolac Tromethamine] Nausea And Vomiting  . Ultram [Tramadol Hcl]   .  Hydrocodone-Acetaminophen Nausea Only    REACTION: "sick"  . Latex Rash     Physical examination: Blood pressure 104/62, pulse 89, resp. rate 18, SpO2 95 %.  General: Alert, interactive, in no acute distress. HEENT: PERRLA, TMs pearly gray, turbinates mildly edematous with clear discharge, post-pharynx non erythematous. Neck: Supple without lymphadenopathy. Lungs: Decreased breath sounds with expiratory wheezing bilaterally. {increased work of breathing. After duoneb decreased wheezing with improved aeration and decreased WOB. CV: Normal S1, S2 without murmurs. Abdomen: Nondistended, nontender.  Skin: Warm and dry, without lesions or rashes. Extremities:  No clubbing, cyanosis or edema. Neuro:   Grossly intact.  Diagnositics/Labs:  Spirometry: FEV1: 1.42L  58%, FVC: 1.86L  62%, ratio consistent with Restrictive pattern   9% increase in FEV1 after Duoneb.   Assessment and plan:   1. Moderate persistent asthma, with exacerbation - exacerbation likely triggered by bacterial URI as symptoms have been ongoing for past 4 weeks without much improvement - It is very important that you take your Breo once daily to prevent flares. - Daily controller medication(s): Breo 290mcg 1 inhalation daily - Rescue medications: ProAir 2-4 puffs every 4-6 hours as needed or albuterol nebulizer one vial puffs every 4-6 hours as needed - take prednisone 30mg  daily x 5 days - take Augmentin 875mg  twice a day x 10 days - Asthma control goals:  * Full participation in all desired activities (may need albuterol before activity) * Albuterol use two time or less a week on average (not counting use with activity) * Cough interfering with sleep two time or less a month * Oral steroids no more than once a year * No hospitalizations  2. Perennial allergic rhinitis - Continue with Nasacort 2 sprays per nostril daily. - Continue with Singulair 10mg  daily. - Continue with cetirizine (Zyrtec) 10mg  daily to help  with the sneezing and other allergy symptoms.  - Continue allergy shots at our clinic.    Return in about 4 months.   I appreciate the opportunity to take part in Nettie's care. Please do not hesitate to contact me with questions.  Sincerely,   Prudy Feeler, MD Allergy/Immunology [facility]

## 2017-05-22 DIAGNOSIS — J3089 Other allergic rhinitis: Secondary | ICD-10-CM | POA: Diagnosis not present

## 2017-05-22 NOTE — Progress Notes (Signed)
2 mt vials made Exp. 05/23/18

## 2017-05-23 ENCOUNTER — Encounter: Payer: Self-pay | Admitting: Nurse Practitioner

## 2017-05-23 ENCOUNTER — Ambulatory Visit (INDEPENDENT_AMBULATORY_CARE_PROVIDER_SITE_OTHER): Payer: Medicare Other | Admitting: Nurse Practitioner

## 2017-05-23 VITALS — BP 108/74 | HR 79 | Temp 98.4°F | Ht 64.0 in | Wt 137.0 lb

## 2017-05-23 DIAGNOSIS — J441 Chronic obstructive pulmonary disease with (acute) exacerbation: Secondary | ICD-10-CM | POA: Diagnosis not present

## 2017-05-23 DIAGNOSIS — J301 Allergic rhinitis due to pollen: Secondary | ICD-10-CM | POA: Diagnosis not present

## 2017-05-23 MED ORDER — DM-GUAIFENESIN ER 30-600 MG PO TB12: 1.0000 | ORAL_TABLET | Freq: Two times a day (BID) | ORAL | 0 refills | Status: DC | PRN

## 2017-05-23 MED ORDER — AMOXICILLIN-POT CLAVULANATE 875-125 MG PO TABS: 1.0000 | ORAL_TABLET | Freq: Two times a day (BID) | ORAL | 0 refills | Status: DC

## 2017-05-23 MED ORDER — BENZONATATE 100 MG PO CAPS: 100.0000 mg | ORAL_CAPSULE | Freq: Three times a day (TID) | ORAL | 0 refills | Status: DC | PRN

## 2017-05-23 NOTE — Patient Instructions (Signed)
Continue other medications as prescribed.  Complete oral antibiotics as presccribed  Maintain adequate oral hydration.  Stop tobacco use

## 2017-05-23 NOTE — Progress Notes (Signed)
Subjective:  Patient ID: Lori [name], female    DOB: [birth year]  Age: 61 y.o. MRN: [id number]  CC: Cough (coughing yellow mucus,dizzy,congestion since 05/16/2017. )   Cough  This is a recurrent problem. The current episode started 1 to 4 weeks ago. The problem has been unchanged. The problem occurs constantly. The cough is productive of purulent sputum. Associated symptoms include chest pain, shortness of breath and wheezing. Pertinent negatives include no chills or fever. She has tried a beta-agonist inhaler, oral steroids, leukotriene antagonists and steroid inhaler for the symptoms. Her past medical history is significant for bronchitis, COPD and emphysema.   Persistent cough and SOB. Complete oral prednisone as prescribed. Did not take augmentin. She states medications were picked up by her husband, and abx was not among medications.  Outpatient Medications Prior to Visit  Medication Sig Dispense Refill  . albuterol (PROVENTIL HFA;VENTOLIN HFA) 108 (90 Base) MCG/ACT inhaler Inhale 2-4 puffs as needed for chest tightness, wheezing or cough. 1 Inhaler 2  . citalopram (CELEXA) 40 MG tablet Take 1 tablet (40 mg total) by mouth daily. 90 tablet 1  . cyclobenzaprine (FLEXERIL) 5 MG tablet Take 1 tablet (5 mg total) by mouth every 12 (twelve) hours as needed for muscle spasms. 30 tablet 1  . dexlansoprazole (DEXILANT) 60 MG capsule Take 1 capsule (60 mg total) by mouth daily. 90 capsule 0  . EPINEPHrine 0.3 mg/0.3 mL IJ SOAJ injection Use as directed for severe allergic reaction 2 Device 1  . fenofibrate (TRICOR) 145 MG tablet     . fluticasone furoate-vilanterol (BREO ELLIPTA) 200-25 MCG/INH AEPB Inhale 1 puff into the lungs daily. 60 each 5  . gabapentin (NEURONTIN) 600 MG tablet Take 600 mg by mouth 2 (two) times daily.  2  . meclizine (ANTIVERT) 12.5 MG tablet Take 1 tablet (12.5 mg total) by mouth 2 (two) times daily as needed for dizziness (do not use for more than 3days). 6 tablet 0   . montelukast (SINGULAIR) 10 MG tablet Take 1 tablet (10 mg total) by mouth at bedtime. 30 tablet 5  . nitroGLYCERIN (NITROSTAT) 0.4 MG SL tablet PLACE 1 TABLET UNDER THE TONGUE Q 5 MINUTES PRF CHEST PAIN  1  . omega-3 acid ethyl esters (LOVAZA) 1 g capsule Take 2 capsules (2 g total) by mouth 2 (two) times daily. 120 capsule 3  . promethazine (PHENERGAN) 25 MG tablet Take 1 tablet (25 mg total) by mouth every 8 (eight) hours as needed for nausea or vomiting. 20 tablet 0  . ranitidine (ZANTAC) 150 MG tablet Take 1 tablet (150 mg total) by mouth at bedtime. 30 tablet 11  . rosuvastatin (CRESTOR) 40 MG tablet Take 1 tablet (40 mg total) by mouth daily. 90 tablet 1  . valACYclovir (VALTREX) 500 MG tablet Take 1 tablet (500 mg total) by mouth daily. 30 tablet 1  . zonisamide (ZONEGRAN) 25 MG capsule TK 4 CS PO QD HS  1  . predniSONE (DELTASONE) 10 MG tablet Take 3 tablets (30 mg total) by mouth daily with breakfast. 15 tablet 0  . amoxicillin-clavulanate (AUGMENTIN) 875-125 MG tablet Take 1 tablet by mouth 2 (two) times daily. (Patient not taking: Reported on 05/23/2017) 20 tablet 0  . dextromethorphan-guaiFENesin (MUCINEX DM) 30-600 MG 12hr tablet Take 1 tablet by mouth 2 (two) times daily as needed for cough. (Patient not taking: Reported on 05/23/2017) 14 tablet 0   No facility-administered medications prior to visit.     ROS See HPI  Objective:  BP 108/74   Pulse 79   Temp 98.4 F (36.9 C)   Ht 5\' 4"  (1.626 m)   Wt 137 lb (62.1 kg)   SpO2 97%   BMI 23.52 kg/m   BP Readings from Last 3 Encounters:  05/23/17 108/74  05/16/17 104/62  03/24/17 114/80    Wt Readings from Last 3 Encounters:  05/23/17 137 lb (62.1 kg)  03/24/17 142 lb (64.4 kg)  03/21/17 142 lb (64.4 kg)    Physical Exam  Constitutional: No distress.  Pulmonary/Chest: She has wheezes. She has no rales.  Neurological: She is alert.  Vitals reviewed.   Lab Results  Component Value Date   WBC 10.9 (H) 10/27/2016    HGB 10.9 (L) 10/27/2016   HCT 32.4 (L) 10/27/2016   PLT 268 10/27/2016   GLUCOSE 88 11/20/2016   CHOL 316 (H) 03/12/2017   TRIG 525 (H) 03/12/2017   HDL 40 03/12/2017   LDLDIRECT 187.0 10/21/2016   LDLCALC Comment 03/12/2017   ALT 17 03/12/2017   AST 17 03/12/2017   NA 139 11/20/2016   K 4.3 11/20/2016   CL 101 11/20/2016   CREATININE 0.83 11/20/2016   BUN 6 (L) 11/20/2016   CO2 23 11/20/2016   TSH 1.74 06/21/2016   INR 0.9 08/31/2008   HGBA1C 5.5 06/21/2016    No results found.  Assessment & Plan:   Edmund was seen today for cough.  Diagnoses and all orders for this visit:  Acute exacerbation of chronic obstructive pulmonary disease (COPD) ([place]) -     dextromethorphan-guaiFENesin (MUCINEX DM) 30-600 MG 12hr tablet; Take 1 tablet by mouth 2 (two) times daily as needed for cough. -     benzonatate (TESSALON) 100 MG capsule; Take 1 capsule (100 mg total) by mouth 3 (three) times daily as needed for cough. -     amoxicillin-clavulanate (AUGMENTIN) 875-125 MG tablet; Take 1 tablet by mouth 2 (two) times daily.   I have discontinued Ms. Cappello's dextromethorphan-guaiFENesin and predniSONE. I am also having her start on dextromethorphan-guaiFENesin and benzonatate. Additionally, I am having her maintain her EPINEPHrine, fenofibrate, gabapentin, citalopram, promethazine, nitroGLYCERIN, ranitidine, zonisamide, rosuvastatin, omega-3 acid ethyl esters, meclizine, cyclobenzaprine, dexlansoprazole, valACYclovir, montelukast, albuterol, fluticasone furoate-vilanterol, AFLURIA QUADRIVALENT, and amoxicillin-clavulanate.  Meds ordered this encounter  Medications  . AFLURIA QUADRIVALENT 0.5 ML injection    Sig: ADM 0.5ML IM UTD    Refill:  0  . dextromethorphan-guaiFENesin (MUCINEX DM) 30-600 MG 12hr tablet    Sig: Take 1 tablet by mouth 2 (two) times daily as needed for cough.    Dispense:  14 tablet    Refill:  0    Order Specific Question:   Supervising Provider    Answer:    Cassandria Anger [[id number]]  . benzonatate (TESSALON) 100 MG capsule    Sig: Take 1 capsule (100 mg total) by mouth 3 (three) times daily as needed for cough.    Dispense:  20 capsule    Refill:  0    Order Specific Question:   Supervising Provider    Answer:   Cassandria Anger [[id number]]  . amoxicillin-clavulanate (AUGMENTIN) 875-125 MG tablet    Sig: Take 1 tablet by mouth 2 (two) times daily.    Dispense:  20 tablet    Refill:  0    Order Specific Question:   Supervising Provider    Answer:   Cassandria Anger [[id number]]    Follow-up: Return if symptoms worsen or fail to improve.  Wilfred Lacy,  NP

## 2017-05-27 ENCOUNTER — Ambulatory Visit (INDEPENDENT_AMBULATORY_CARE_PROVIDER_SITE_OTHER)
Admission: RE | Admit: 2017-05-27 | Discharge: 2017-05-27 | Disposition: A | Discharge status: Home / Self Care | Payer: Medicare Other | Source: Ambulatory Visit | Attending: Family Medicine | Admitting: Family Medicine

## 2017-05-27 ENCOUNTER — Telehealth: Payer: Self-pay | Admitting: Family Medicine

## 2017-05-27 ENCOUNTER — Encounter: Payer: Self-pay | Admitting: Family Medicine

## 2017-05-27 ENCOUNTER — Ambulatory Visit (INDEPENDENT_AMBULATORY_CARE_PROVIDER_SITE_OTHER): Payer: Medicare Other | Admitting: Family Medicine

## 2017-05-27 VITALS — BP 122/80 | HR 89 | Temp 99.0°F | Resp 16 | Ht 64.0 in | Wt 138.0 lb

## 2017-05-27 DIAGNOSIS — M25521 Pain in right elbow: Secondary | ICD-10-CM | POA: Diagnosis not present

## 2017-05-27 DIAGNOSIS — M797 Fibromyalgia: Secondary | ICD-10-CM

## 2017-05-27 NOTE — Telephone Encounter (Signed)
Pt is scheduled today at 1:15 with Dr. Raeford Razor.

## 2017-05-27 NOTE — Patient Instructions (Addendum)
Thank you for coming in,   Please try rubbing Aspercreme over the joint.  Please try the exercises that I have provided you.  We will call you with results from today.   Please feel free to call with any questions or concerns at any time, at [phone]. --Dr. Raeford Razor

## 2017-05-27 NOTE — Assessment & Plan Note (Signed)
Exam is inconsistent with the specific diagnosis. Possible that she has some arthritis related to her previous fracture. May be related to her underlying fibromyalgia. - Advised topical creams - Provided home exercises - X-rays today - If no improvement consider an anti-inflammatory versus gabapentin versus formal physical therapy.

## 2017-05-27 NOTE — Progress Notes (Signed)
Lori [name] - 61 y.o. female MRN [id number]  Date of birth: [birth year]  SUBJECTIVE:  Including CC & ROS.  Chief Complaint  Patient presents with  . Elbow Pain    right elbow pain x2 weeks, described pain as burning and stinging pain, has kept her up at night    Lori [name] is a 61 year old female presenting with right elbow pain. She reports that this is generalized in nature. It is burning and stinging. This pain started about 2 weeks ago. There is no trauma or injury. She does have a history of a proximal ulnar fracture that was repaired. She reports moving the elbow seemed to exacerbate her problems. The pain is localized around the elbow joint. She denies any swelling. She denies any numbness or tingling. She is currently disabled. She denies doing any repetitive motions.     Review of Systems  Musculoskeletal: Positive for back pain. Negative for gait problem and joint swelling.  Skin: Negative for color change.  Neurological: Negative for weakness and numbness.    HISTORY: Past Medical, Surgical, Social, and Family History Reviewed & Updated per EMR.   Pertinent Historical Findings include:  Past Medical History:  Diagnosis Date  . Acute MI ([place])    x3 - by report only. She has had 3 negative Myoview stress test with no evidence of prior infarct.  . Allergic rhinitis   . Allergy   . Anemia   . Ankle fracture 05/2016  . Anxiety   . Arthritis   . Asthma   . Barrett esophagus 2007  . Chest pain 05-02-2009   echo  EF 55%  . COPD (chronic obstructive pulmonary disease) with chronic bronchitis ([facility])    & Emphysema  . Depression   . Duodenitis without mention of hemorrhage 2007  . Esophageal reflux 2007  . Esophageal stricture   . Full dentures   . H/O hiatal hernia   . Hiatal hernia [id number]  . Hyperlipidemia   . Multiple fractures    from falls, fx rt. elbow, fx left wrist, bilateral ankles  . Pneumonia 10/2016  . Ulcer     Past Surgical History:  Procedure  Laterality Date  . ABDOMINAL HYSTERECTOMY     BSO  . APPENDECTOMY    . CESAREAN SECTION     x 2  . CHOLECYSTECTOMY    . CHONDROPLASTY Left 01/06/2015   Procedure: CHONDROPLASTY;  Surgeon: Marybelle Killings, MD;  Location: [facility];  Service: Orthopedics;  Laterality: Left;  . COLONOSCOPY    . ELBOW FRACTURE SURGERY     right  . KNEE ARTHROSCOPY WITH EXCISION PLICA Left 5/62/5638   Procedure: KNEE ARTHROSCOPY WITH EXCISION PLICA;  Surgeon: Marybelle Killings, MD;  Location: [facility];  Service: Orthopedics;  Laterality: Left;  . Lower Extremity Arterial Dopplers  07/06/2013   RABI 1.0, LABI 1.1.; No evidence of significant vascular atherogenic plaque  . NECK SURGERY    . NM MYOVIEW LTD  09/2014   LOW RISK. Normal EF of 65% with no regional wall motion abnormalities. No ischemia or infarction.  . TONSILLECTOMY    . WRIST FRACTURE SURGERY     left, has plate    Allergies  Allergen Reactions  . Bee Venom Swelling  . Codeine Nausea Only    CAUSES ULCERS  . Ibuprofen Nausea And Vomiting  . Levofloxacin Nausea Only  . Propoxyphene N-Acetaminophen Nausea Only  . Toradol [Ketorolac Tromethamine] Nausea And Vomiting  . Ultram [Tramadol Hcl]   .  Hydrocodone-Acetaminophen Nausea Only    REACTION: "sick"  . Latex Rash    Family History  Problem Relation Age of Onset  . Emphysema Father   . Dementia Mother   . Diabetes Maternal Grandmother   . Diabetes Maternal Aunt   . Diabetes Unknown        mat. cousin  . Heart disease Brother   . Colon cancer Neg Hx   . Allergic rhinitis Neg Hx   . Angioedema Neg Hx   . Asthma Neg Hx   . Atopy Neg Hx   . Eczema Neg Hx   . Immunodeficiency Neg Hx   . Urticaria Neg Hx      Social History   Social History  . Marital status: Married    Spouse name: [name]  . Number of children: 2  . Years of education: HS   Occupational History  . disable    Social History Main Topics  . Smoking status: Current Every Day  Smoker    Packs/day: 1.00    Years: 39.00    Types: Cigarettes    Start date: 08/06/1977  . Smokeless tobacco: Never Used     Comment: Currently smoking 1 ppd.  Quit once for 4 months.   . Alcohol use No  . Drug use: No  . Sexual activity: No   Other Topics Concern  . Not on file   Social History Narrative   Patient lives at home spouse - Lori [name].  Has 2 daughters (30 & 80).   Currently "disabled".   Caffeine Use: tea 4 glasses daily.    Smokes ~1 1/2 PPD  05/15/15   Walks everyday - no set routine.      [facility] (12/03/16):   Originally from [facility]. 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 Currently has a dog. No bird exposure. No known mold exposure.      PHYSICAL EXAM:  VS: BP 122/80 (BP Location: Left Arm, Patient Position: Sitting, Cuff Size: Normal)   Pulse 89   Temp 99 F (37.2 C) (Oral)   Resp 16   Ht 5\' 4"  (1.626 m)   Wt 138 lb (62.6 kg)   SpO2 95%   BMI 23.69 kg/m  Physical Exam Gen: NAD, alert, cooperative with exam, well-appearing ENT: normal lips, normal nasal mucosa,  Eye: normal EOM, normal conjunctiva and lids CV:  no edema, +2 pedal pulses   Resp: no accessory muscle use, non-labored,  Skin: no rashes, no areas of induration  Neuro: normal tone, normal sensation to touch Psych:  normal insight, alert and oriented MSK:  Right elbow: No obvious swelling.  Some tenderness to palpation of the olecranon, lateral epicondyle, medial condyle and antecubital fossa  Normal passive range of motion. Normal grip strength. Normal finger abduction and abduction. Normal pincer grasp. Negative Tinel's at the elbow. Some pain with resistance to pronation and supination. Neurovascular intact  Limited ultrasound: Right elbow:  Normal appearance of the triceps tendon into the olecranon. No abnormalities observed of the lateral condyle or medial condyle. Normal appearance of the ulnar nerve in the cubital tunnel  Summary:  normal  exam  Ultrasound and interpretation by Clearance Coots, MD             ASSESSMENT & PLAN:   Right elbow pain Exam is inconsistent with the specific diagnosis. Possible that she has some arthritis related to her previous fracture. May be related to her underlying fibromyalgia. - Advised topical creams - Provided home exercises - X-rays  today - If no improvement consider an anti-inflammatory versus gabapentin versus formal physical therapy.

## 2017-05-27 NOTE — Telephone Encounter (Signed)
Pt has an appointment on 9/24 for a new patient and states she is in a lot of pain and would like to know if she can be worked in sooner. Please advise and call back

## 2017-05-31 IMAGING — CR DG LUMBAR SPINE COMPLETE 4+V
5 series · 5 of 5 positions shown · non-contrast
Comparison: Lumbar radiographs May 05, 2011; lumbar MRI June 26, 2013

CLINICAL DATA: Pain following fall earlier today

EXAM:
LUMBAR SPINE - COMPLETE 4+ VIEW

[t lumbar spine ap]
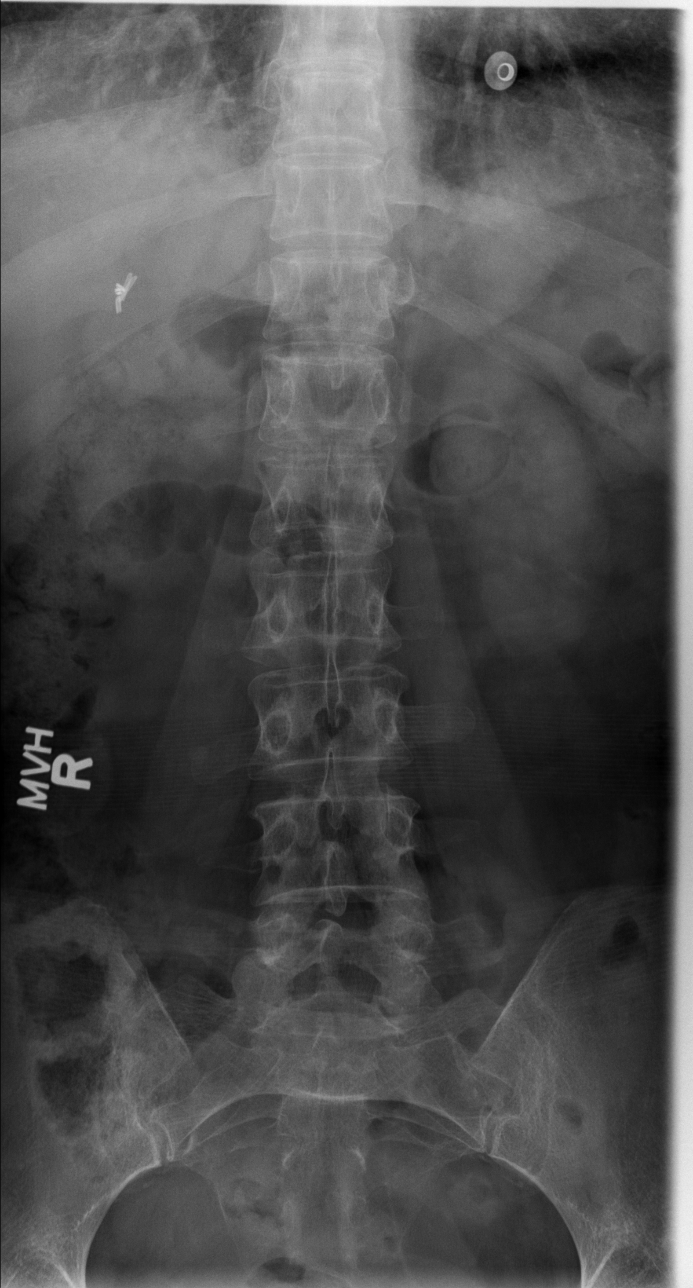

[t lumbar spine obl (1 of 2)]
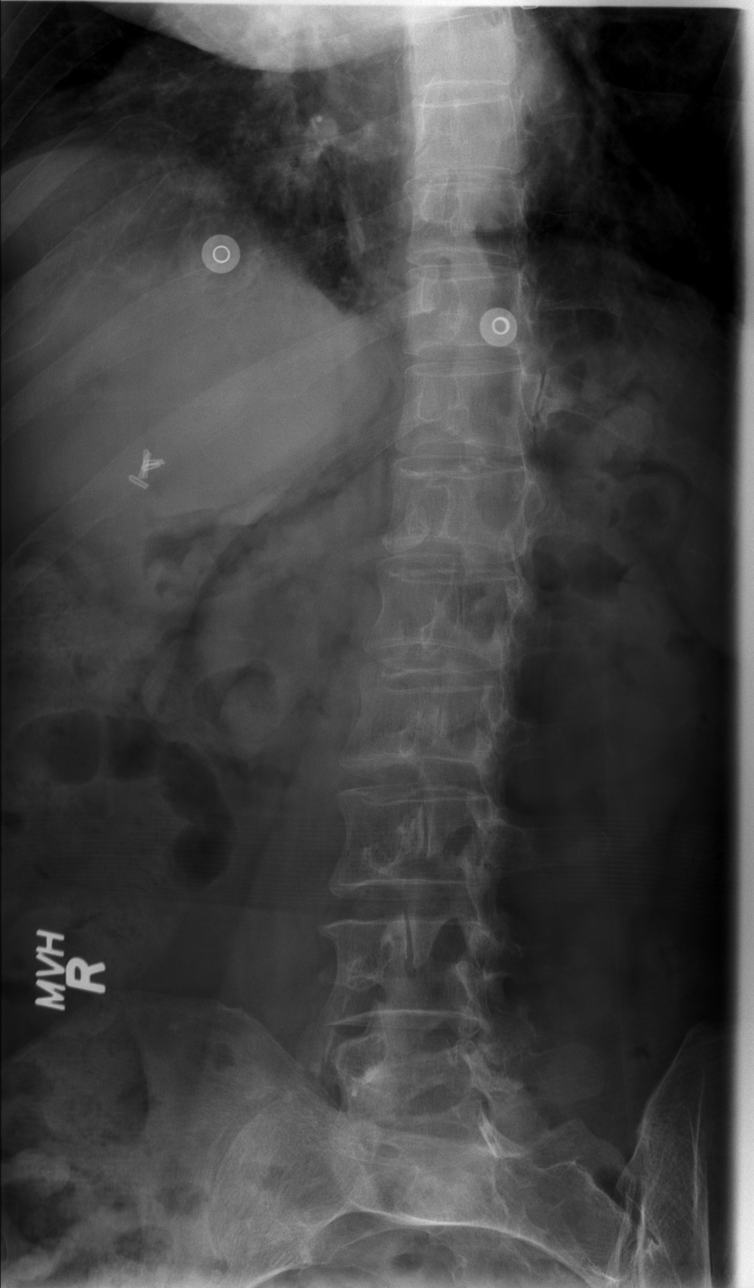

[t lumbar spine obl (2 of 2)]
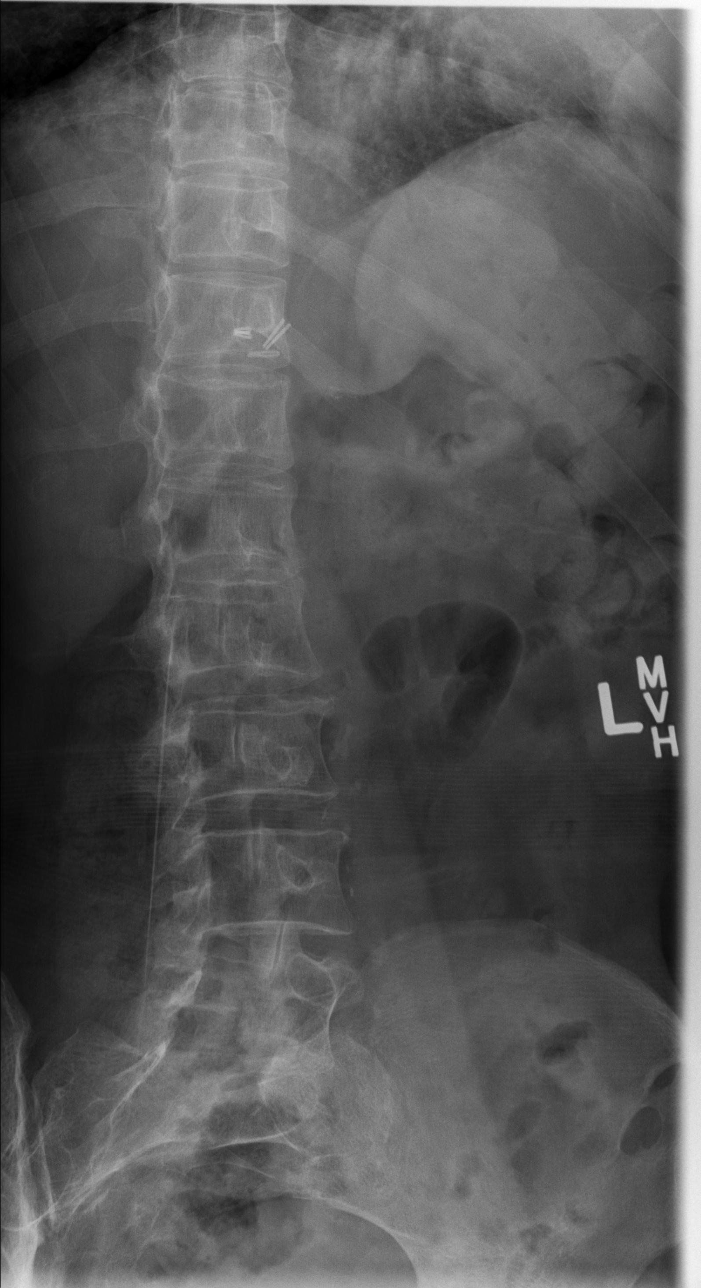

[t lumbar spine lat]
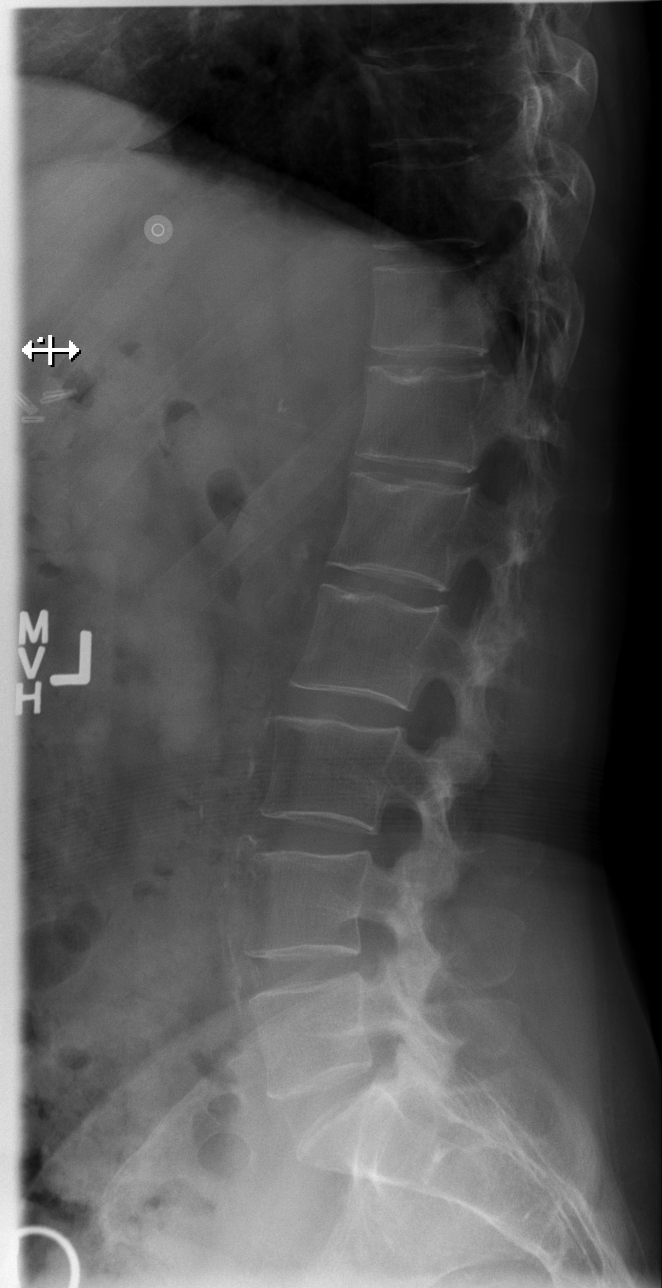

[t lumbar l-5 s-1 spot]
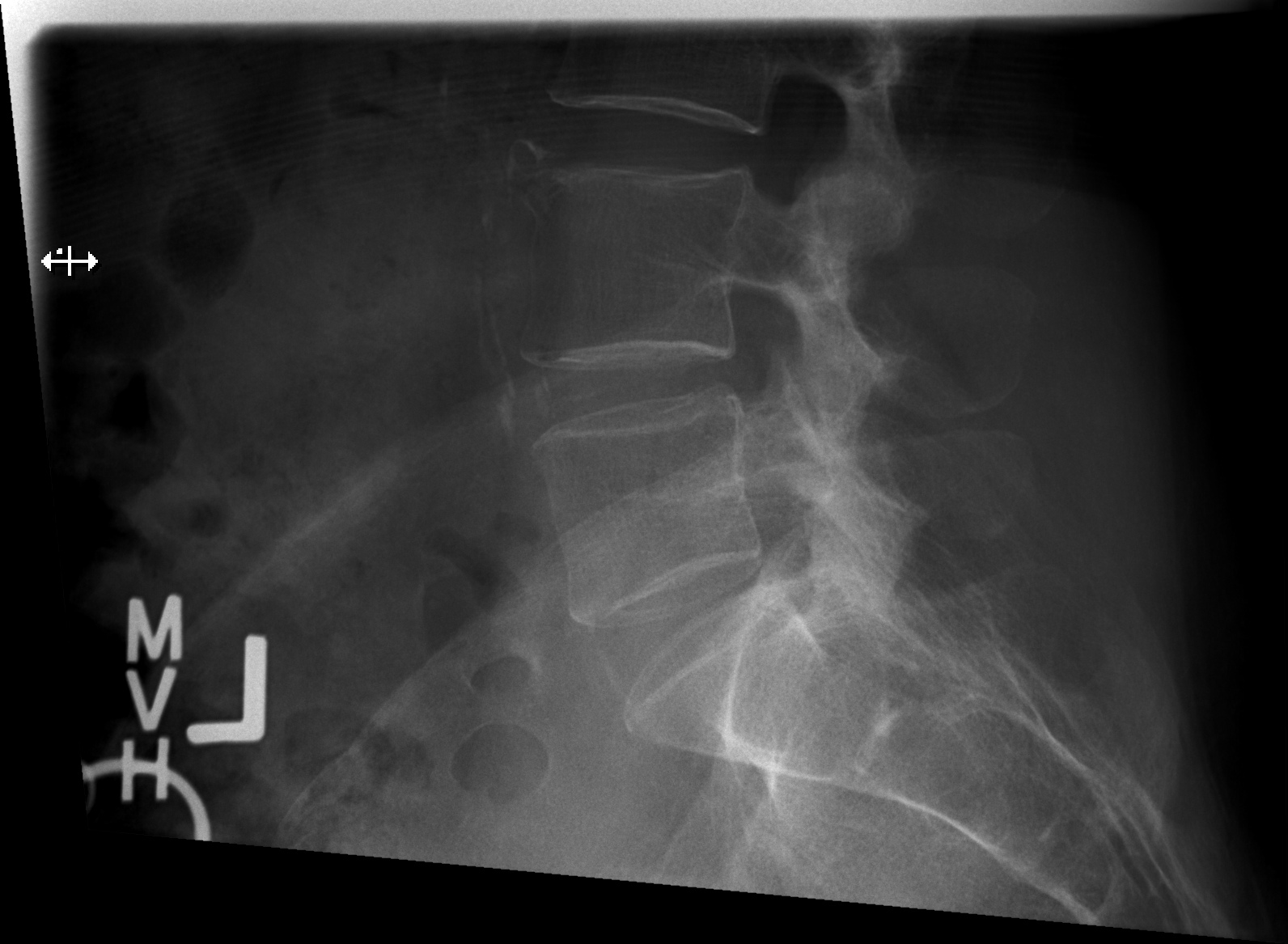

[5 of 5 positions shown; findings below may reference images not displayed]

FINDINGS: Frontal, lateral, spot lumbosacral lateral, and bilateral oblique
views were obtained. There are 5 non-rib-bearing lumbar type
vertebral bodies. The T12 ribs are hypoplastic. There is no fracture
or spondylolisthesis. Disc spaces appear intact. There are Schmorl's
nodes along the superior aspects of the T12, L1, and L2 vertebral
bodies, stable. There is slight facet osteoarthritic change at L4-5
bilaterally and at L5-S1 on the right. There are scattered foci of
atherosclerotic change in the aorta.
IMPRESSION: Slight osteoarthritic change. No fracture or spondylolisthesis.
Areas of atherosclerotic calcification.

## 2017-05-31 IMAGING — CT CT HEAD W/O CM
3 of 6 series · 15 of 47 positions shown, 18 images · non-contrast
Comparison: 02/26/2013 and 01/20/2013

CLINICAL DATA: Fall, head injury

EXAM:
CT HEAD WITHOUT CONTRAST
CT CERVICAL SPINE WITHOUT CONTRAST
TECHNIQUE: Multidetector CT imaging of the head and cervical spine was
performed following the standard protocol without intravenous
contrast. Multiplanar CT image reconstructions of the cervical spine
were also generated.

[Series 7: axial recon · axial · 0.23mm/px · z∈[+1363,+1488]mm · 9 of 88 slices shown, 12 images]
[im 9/88  brain]
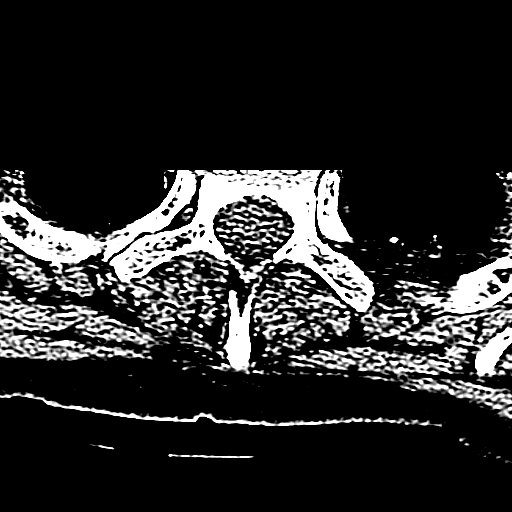
[im 9/88  bone]
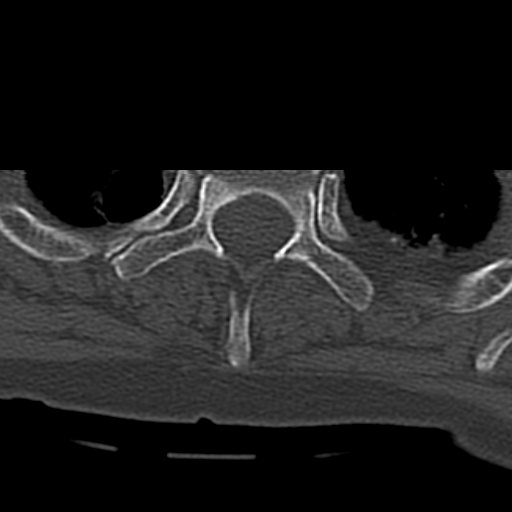
[im 18/88  brain]
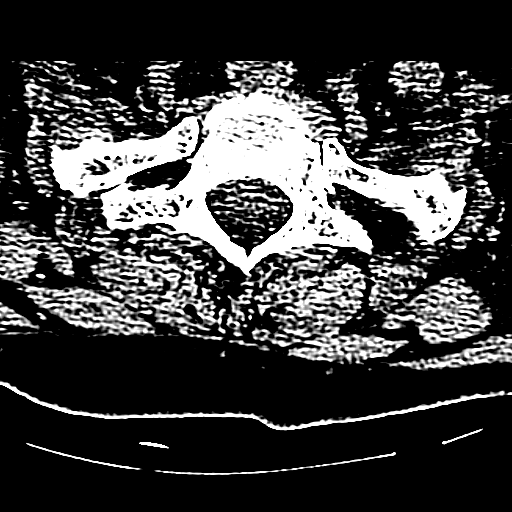
[im 27/88  brain]
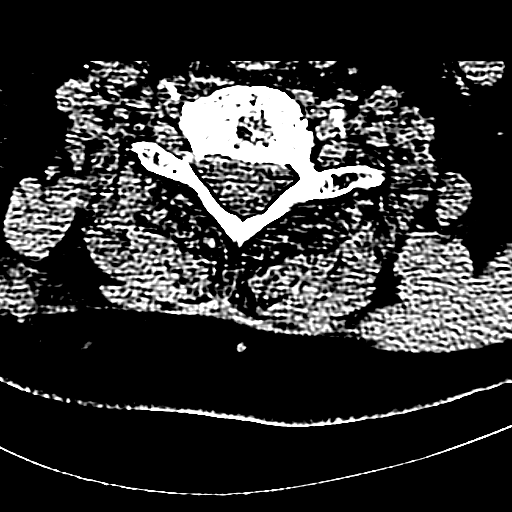
[im 35/88  brain]
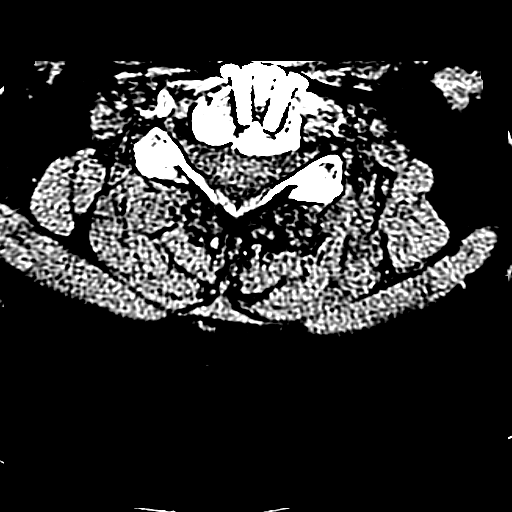
[im 44/88  brain]
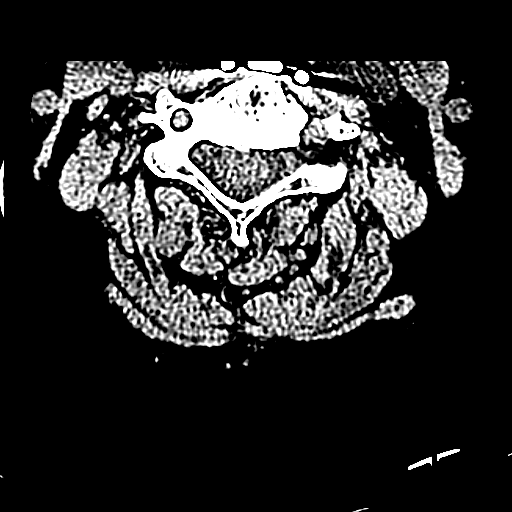
[im 44/88  bone]
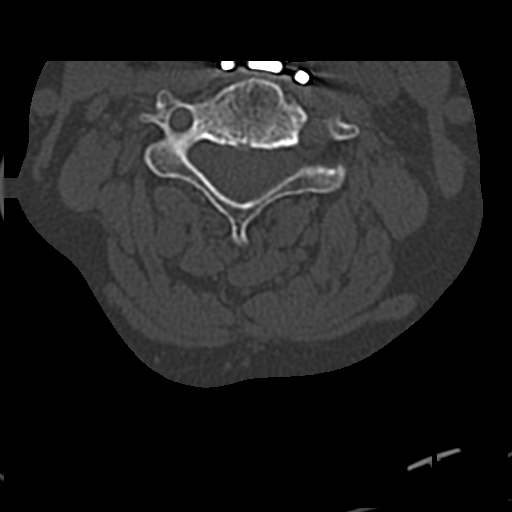
[im 53/88  brain]
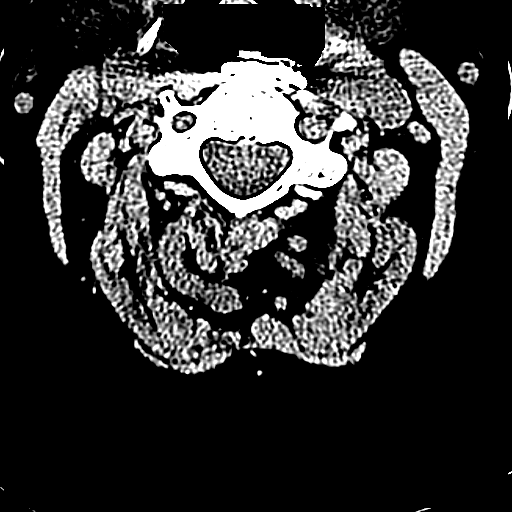
[im 61/88  brain]
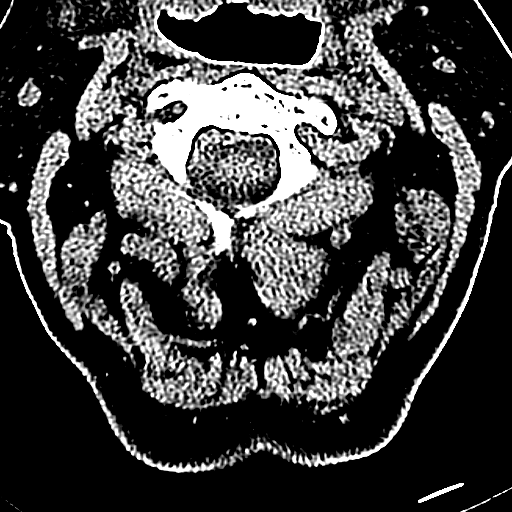
[im 70/88  brain]
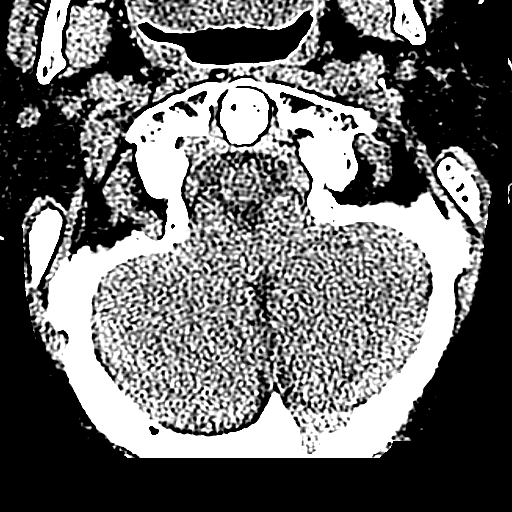
[im 79/88  brain]
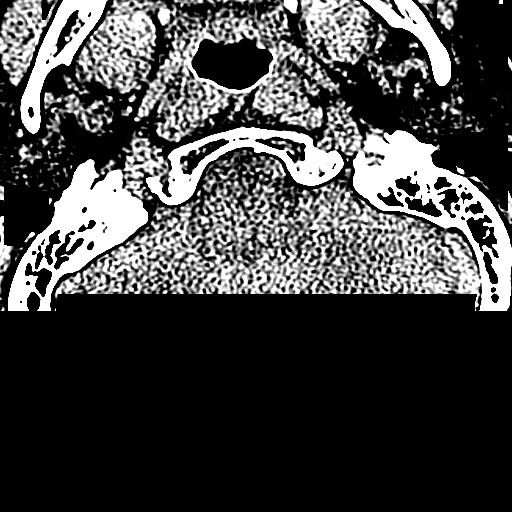
[im 79/88  bone]
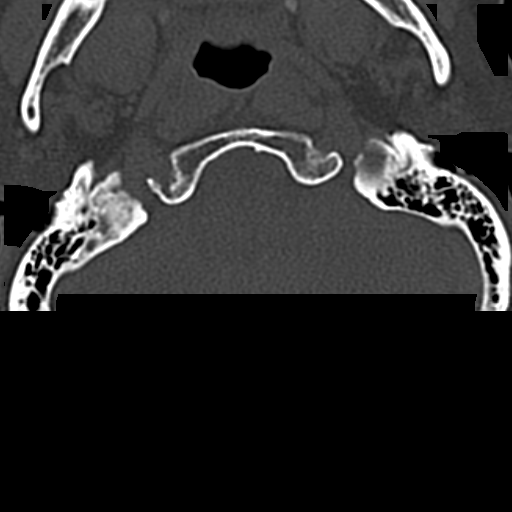

[Series 8: coronal · coronal · 0.28mm/px · 3 of 36 slices shown]
[im 12/36  brain]
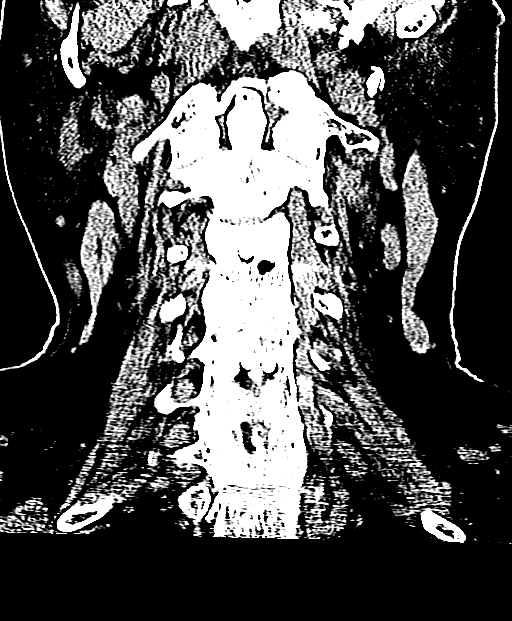
[im 16/36  brain]
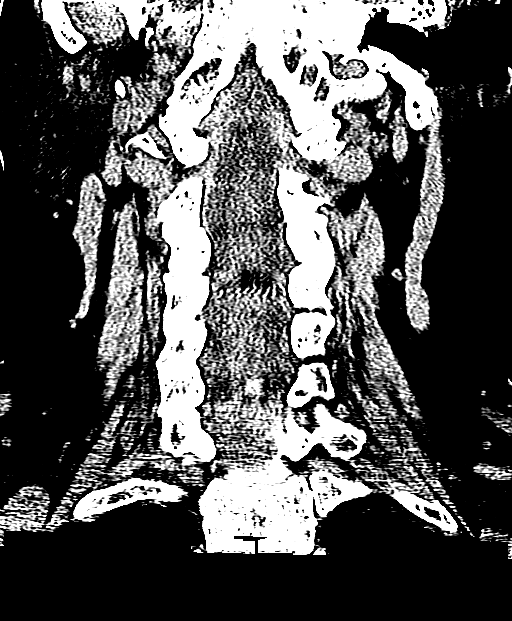
[im 20/36  brain]
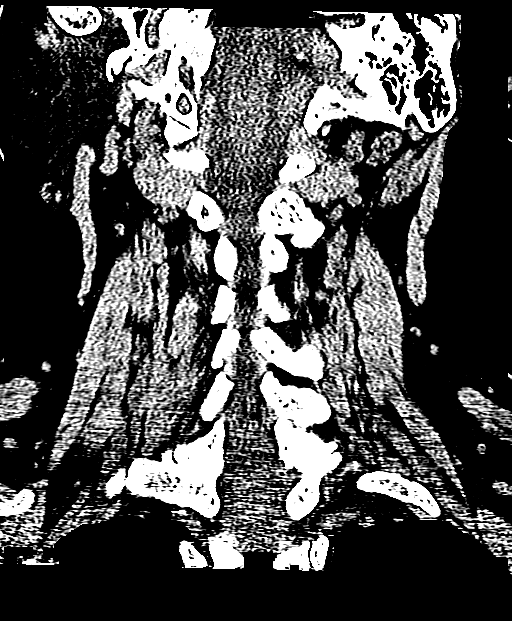

[Series 9: sagittal · sagittal · 0.29mm/px · 3 of 44 slices shown]
[im 15/44  brain]
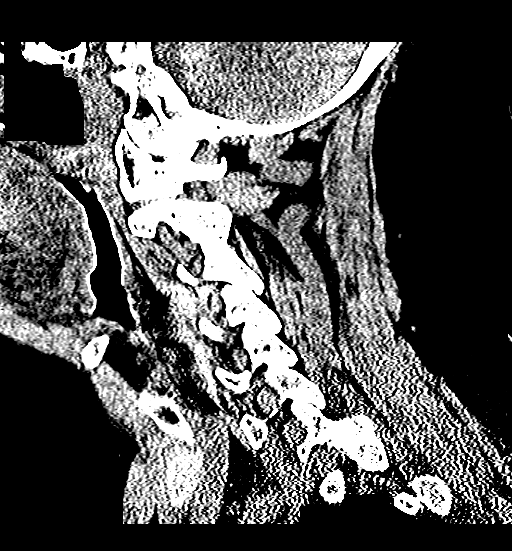
[im 22/44  brain]
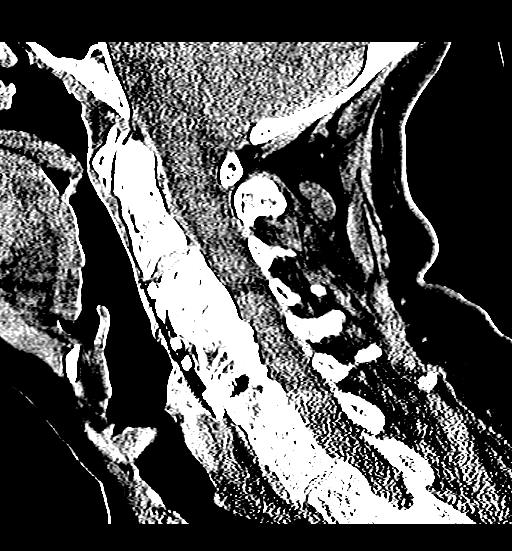
[im 29/44  brain]
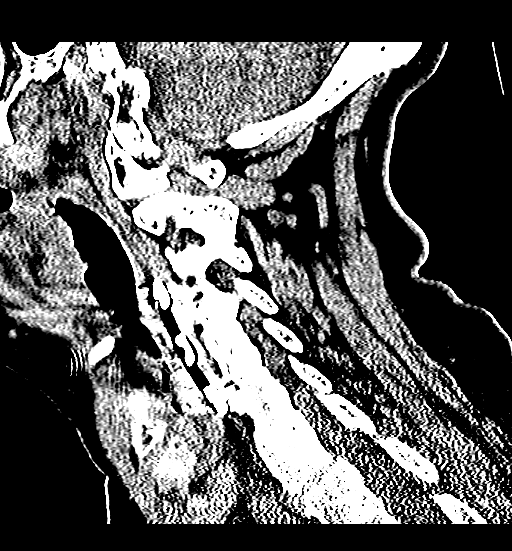

[15 of 47 positions shown; findings below may reference images not displayed]

FINDINGS: CT HEAD FINDINGS

No skull fracture is noted. Paranasal sinuses and mastoid air cells
are unremarkable. No intracranial hemorrhage, mass effect or midline
shift. No acute cortical infarction. No mass lesion is noted on this
unenhanced scan.

CT CERVICAL SPINE FINDINGS

Axial images of the cervical spine shows no acute fracture or
subluxation. Postsurgical changes with anterior metallic fusion
noted at C3-C4 and C5 level. There is bony fusion C5, C6 and C7
vertebral body. Degenerative changes C1-C2 articulation. No
prevertebral soft tissue swelling. Cervical airway is patent.

There is no pneumothorax in visualized lung apices. Mild
emphysematous changes right apex.
IMPRESSION: 1. No acute intracranial abnormality.  No significant change.
2. No cervical spine acute fracture or subluxation. Degenerative
changes as described above. Stable postsurgical changes as described
above.

## 2017-06-02 ENCOUNTER — Telehealth: Payer: Self-pay | Admitting: Family Medicine

## 2017-06-02 NOTE — Telephone Encounter (Signed)
Pt called back regarding this, please call back.

## 2017-06-02 NOTE — Telephone Encounter (Signed)
Pt called stating that her right elbow is "burning real bad". She said that you told her if she was not doing any better to call the office and see if something could be called in. Please advise.

## 2017-06-03 ENCOUNTER — Telehealth: Payer: Self-pay | Admitting: Cardiology

## 2017-06-03 MED ORDER — DICLOFENAC SODIUM 1 % TD GEL: 2.0000 g | Freq: Four times a day (QID) | TRANSDERMAL | 2 refills | Status: DC

## 2017-06-03 NOTE — Telephone Encounter (Signed)
New message       Talk to the nurse about getting something like a life alert monitor.  She said her ins would pay but the doctor need to place the order.  She want to talk to the nurse about this.  Please call

## 2017-06-03 NOTE — Telephone Encounter (Signed)
Pt called and cannot afford this medication it is $50 dollars, she would like something else sent in. Please advise

## 2017-06-03 NOTE — Telephone Encounter (Signed)
Returned call to patient. She states she would like to get something like a "life alert" so that if she falls, she can push a button to get assistance. She states her medicaid will pay for this if MD will call and give an order.   Customer service #: [phone] or [phone]

## 2017-06-03 NOTE — Telephone Encounter (Signed)
Intolerant to NSAIDS orally on chart review so will send in voltaren gel.   Rosemarie Ax, MD [facility] 06/03/2017, 9:17 AM

## 2017-06-04 ENCOUNTER — Telehealth: Payer: Self-pay | Admitting: Nurse Practitioner

## 2017-06-04 NOTE — Telephone Encounter (Signed)
Advise pt that we do not order of do anything to this stuff. Advise pt to call the insurance company get all information and contact the company that they will cover for life alert. Pt verbalized understand.

## 2017-06-04 NOTE — Telephone Encounter (Signed)
SPOKE TO PATIENT , INFORMED HER SHE NEEDS TO CONTACT PRIMARY FOR THE ORDER IF NEEDED FOR A LIFE ALERT. SHE VERBALIZED UNDERSTANDING.

## 2017-06-04 NOTE — Telephone Encounter (Signed)
Spoke with patient and advised to pick up some [place] that I have laid out for her at [facility], Enid Baas, MD [facility] 06/04/2017, 9:10 AM

## 2017-06-04 NOTE — Telephone Encounter (Signed)
Pt came by the office requesting an order for a life allert button or something like it. She said that she called her insurance company and they told her that they would cover it but that our office would need to call them to discuss what would need to be ordered and to let them know that the pt has already spoke with them.  Customer Service # [phone] Member ID # [id number] She would like a call to let her know the status of this.

## 2017-06-05 NOTE — Telephone Encounter (Signed)
Agreed. ° °David Harding, MD ° °

## 2017-06-08 ENCOUNTER — Emergency Department (HOSPITAL_COMMUNITY): Payer: Medicare Other

## 2017-06-08 ENCOUNTER — Encounter (HOSPITAL_COMMUNITY): Payer: Self-pay | Admitting: *Deleted

## 2017-06-08 ENCOUNTER — Emergency Department (HOSPITAL_COMMUNITY)
Admission: EM | Admit: 2017-06-08 | Discharge: 2017-06-08 | Discharge status: Against Medical Advice | Payer: Medicare Other

## 2017-06-08 ENCOUNTER — Emergency Department (HOSPITAL_COMMUNITY)
Admission: EM | Admit: 2017-06-08 | Discharge: 2017-06-08 | Disposition: A | Discharge status: Home / Self Care | Payer: Medicare Other | Attending: Emergency Medicine | Admitting: Emergency Medicine

## 2017-06-08 DIAGNOSIS — Z885 Allergy status to narcotic agent status: Secondary | ICD-10-CM | POA: Diagnosis not present

## 2017-06-08 DIAGNOSIS — M25521 Pain in right elbow: Secondary | ICD-10-CM | POA: Insufficient documentation

## 2017-06-08 DIAGNOSIS — Z91040 Latex allergy status: Secondary | ICD-10-CM | POA: Diagnosis not present

## 2017-06-08 DIAGNOSIS — I252 Old myocardial infarction: Secondary | ICD-10-CM | POA: Insufficient documentation

## 2017-06-08 DIAGNOSIS — J449 Chronic obstructive pulmonary disease, unspecified: Secondary | ICD-10-CM | POA: Insufficient documentation

## 2017-06-08 DIAGNOSIS — J4540 Moderate persistent asthma, uncomplicated: Secondary | ICD-10-CM | POA: Insufficient documentation

## 2017-06-08 DIAGNOSIS — Z79899 Other long term (current) drug therapy: Secondary | ICD-10-CM | POA: Insufficient documentation

## 2017-06-08 DIAGNOSIS — Z91030 Bee allergy status: Secondary | ICD-10-CM | POA: Insufficient documentation

## 2017-06-08 DIAGNOSIS — S59901A Unspecified injury of right elbow, initial encounter: Secondary | ICD-10-CM | POA: Diagnosis not present

## 2017-06-08 DIAGNOSIS — F17210 Nicotine dependence, cigarettes, uncomplicated: Secondary | ICD-10-CM | POA: Diagnosis not present

## 2017-06-08 DIAGNOSIS — M25511 Pain in right shoulder: Secondary | ICD-10-CM | POA: Diagnosis not present

## 2017-06-08 MED ORDER — CYCLOBENZAPRINE HCL 10 MG PO TABS
5.0000 mg | ORAL_TABLET | Freq: Once | ORAL | Status: AC
  Administered 2017-06-08: 5 mg via ORAL
  Filled 2017-06-08: qty 1

## 2017-06-08 MED ORDER — OXYCODONE-ACETAMINOPHEN 5-325 MG PO TABS
2.0000 | ORAL_TABLET | Freq: Once | ORAL | Status: AC
  Administered 2017-06-08: 2 via ORAL
  Filled 2017-06-08: qty 2

## 2017-06-08 MED ORDER — ACETAMINOPHEN 325 MG PO TABS: 650.0000 mg | ORAL_TABLET | Freq: Once | ORAL | Status: DC

## 2017-06-08 NOTE — ED Notes (Signed)
Pt left to go to urgent care due to wait times

## 2017-06-08 NOTE — ED Notes (Signed)
Pt state no change in pain at right shoulder.

## 2017-06-08 NOTE — ED Provider Notes (Signed)
[place] DEPT Provider Note   CSN: [id number] Arrival date & time: 06/08/17  0911     History   Chief Complaint Chief Complaint  Patient presents with  . Arm Pain    HPI Lori [name] is a 61 y.o. female.  HPI  61 year old female presenting complaining of right elbow pain for the past month. She initially stated that there was not trauma and she had pain right at the elbow. She states that since that time she has struck it on something. She was seen at [facility] primary care and evaluated for her elbow pain by Dr. Raeford Razor he advised that this may be related to arthritis from her previous fracture. Advised topical creams, and home exercises. She states that this has not helped and she has not returned. She states that she was not told to return. She denies any swelling, fever, numbness, or tingling. Past Medical History:  Diagnosis Date  . Acute MI ([place])    x3 - by report only. She has had 3 negative Myoview stress test with no evidence of prior infarct.  . Allergic rhinitis   . Allergy   . Anemia   . Ankle fracture 05/2016  . Anxiety   . Arthritis   . Asthma   . Barrett esophagus 2007  . Chest pain 05-02-2009   echo  EF 55%  . COPD (chronic obstructive pulmonary disease) with chronic bronchitis (HCC)    & Emphysema  . Depression   . Duodenitis without mention of hemorrhage 2007  . Esophageal reflux 2007  . Esophageal stricture   . Full dentures   . H/O hiatal hernia   . Hiatal hernia 3474,2595  . Hyperlipidemia   . Multiple fractures    from falls, fx rt. elbow, fx left wrist, bilateral ankles  . Pneumonia 10/2016  . Ulcer     Patient Active Problem List   Diagnosis Date Noted  . Right elbow pain 05/27/2017  . COPD mixed type ([place]) 01/31/2017  . Dizziness 01/20/2017  . Perennial allergic rhinitis 01/01/2017  . Moderate persistent asthma, uncomplicated 63/87/5643  . Pulmonary emphysema ([place]) 12/03/2016  . Tobacco use disorder 12/03/2016  . Chronic  seasonal allergic rhinitis 12/03/2016  . Medication management 11/29/2016  . Cough 11/23/2016  . Rash 11/23/2016  . Vertigo 10/22/2016  . Gastroesophageal reflux disease with esophagitis 10/22/2016  . Non compliance w medication regimen 08/01/2016  . Allergic contact dermatitis due to metals 08/01/2016  . Migraine without aura and without status migrainosus, not intractable 07/22/2016  . Spasm of muscle of lower back 06/21/2016  . Chronic rhinitis 06/19/2016  . Gait difficulty 06/21/2015  . Fibromyalgia 06/21/2015  . Conversion reaction 06/21/2015  . Depression 06/21/2015  . Syncope and collapse 06/21/2015  . Plica syndrome of left knee 01/06/2015  . Anxiety state 09/06/2014  . History of MI 09/06/2014  . Cigarette smoker 06/27/2013  . Borderline hypertension 06/27/2013  . Leg pain, bilateral 06/14/2013  . GASTROPARESIS 10/26/2009  . Musculoskeletal chest pain 05/02/2009  . Hyperlipidemia LDL goal <100 11/18/2008  . Asthma 11/18/2008  . HEPATIC CYST 11/18/2008    Past Surgical History:  Procedure Laterality Date  . ABDOMINAL HYSTERECTOMY     BSO  . APPENDECTOMY    . CESAREAN SECTION     x 2  . CHOLECYSTECTOMY    . CHONDROPLASTY Left 01/06/2015   Procedure: CHONDROPLASTY;  Surgeon: Marybelle Killings, MD;  Location: [facility];  Service: Orthopedics;  Laterality: Left;  . COLONOSCOPY    .  ELBOW FRACTURE SURGERY     right  . KNEE ARTHROSCOPY WITH EXCISION PLICA Left 7/67/3419   Procedure: KNEE ARTHROSCOPY WITH EXCISION PLICA;  Surgeon: Marybelle Killings, MD;  Location: [facility];  Service: Orthopedics;  Laterality: Left;  . Lower Extremity Arterial Dopplers  07/06/2013   RABI 1.0, LABI 1.1.; No evidence of significant vascular atherogenic plaque  . NECK SURGERY    . NM MYOVIEW LTD  09/2014   LOW RISK. Normal EF of 65% with no regional wall motion abnormalities. No ischemia or infarction.  . TONSILLECTOMY    . WRIST FRACTURE SURGERY     left, has  plate    OB History    No data available       Home Medications    Prior to Admission medications   Medication Sig Start Date End Date Taking? Authorizing Provider  AFLURIA QUADRIVALENT 0.5 ML injection ADM 0.5ML IM UTD 05/18/17   [provider]  albuterol (PROVENTIL HFA;VENTOLIN HFA) 108 (90 Base) MCG/ACT inhaler Inhale 2-4 puffs as needed for chest tightness, wheezing or cough. 05/16/17   Kennith Gain, MD  amoxicillin-clavulanate (AUGMENTIN) 875-125 MG tablet Take 1 tablet by mouth 2 (two) times daily. 05/23/17   Nche, Charlene Brooke, NP  benzonatate (TESSALON) 100 MG capsule Take 1 capsule (100 mg total) by mouth 3 (three) times daily as needed for cough. 05/23/17   Nche, Charlene Brooke, NP  citalopram (CELEXA) 40 MG tablet Take 1 tablet (40 mg total) by mouth daily. 02/20/17   Nche, Charlene Brooke, NP  cyclobenzaprine (FLEXERIL) 5 MG tablet Take 1 tablet (5 mg total) by mouth every 12 (twelve) hours as needed for muscle spasms. 04/17/17   Nche, Charlene Brooke, NP  dexlansoprazole (DEXILANT) 60 MG capsule Take 1 capsule (60 mg total) by mouth daily. 04/22/17   Javier Glazier, MD  dextromethorphan-guaiFENesin [facility] DM) 30-600 MG 12hr tablet Take 1 tablet by mouth 2 (two) times daily as needed for cough. 05/23/17   Nche, Charlene Brooke, NP  diclofenac sodium (VOLTAREN) 1 % GEL Apply 2 g topically 4 (four) times daily. 06/03/17   Rosemarie Ax, MD  EPINEPHrine 0.3 mg/0.3 mL IJ SOAJ injection Use as directed for severe allergic reaction 10/04/16   Valentina Shaggy, MD  fenofibrate (TRICOR) 145 MG tablet  10/26/16   [provider]  fluticasone furoate-vilanterol (BREO ELLIPTA) 200-25 MCG/INH AEPB Inhale 1 puff into the lungs daily. 05/16/17   Kennith Gain, MD  gabapentin (NEURONTIN) 600 MG tablet Take 600 mg by mouth 2 (two) times daily. 02/09/17   [provider]  meclizine (ANTIVERT) 12.5 MG tablet Take 1 tablet (12.5 mg total) by mouth 2 (two)  times daily as needed for dizziness (do not use for more than 3days). 03/24/17   Nche, Charlene Brooke, NP  montelukast (SINGULAIR) 10 MG tablet Take 1 tablet (10 mg total) by mouth at bedtime. 05/16/17   Kennith Gain, MD  nitroGLYCERIN (NITROSTAT) 0.4 MG SL tablet PLACE 1 TABLET UNDER THE TONGUE Q 5 MINUTES PRF CHEST PAIN 12/10/16   [provider]  omega-3 acid ethyl esters (LOVAZA) 1 g capsule Take 2 capsules (2 g total) by mouth 2 (two) times daily. 03/24/17   Nche, Charlene Brooke, NP  promethazine (PHENERGAN) 25 MG tablet Take 1 tablet (25 mg total) by mouth every 8 (eight) hours as needed for nausea or vomiting. 02/20/17   Nche, Charlene Brooke, NP  ranitidine (ZANTAC) 150 MG tablet Take 1 tablet (  150 mg total) by mouth at bedtime. 03/11/17   Javier Glazier, MD  rosuvastatin (CRESTOR) 40 MG tablet Take 1 tablet (40 mg total) by mouth daily. 03/24/17   Nche, Charlene Brooke, NP  valACYclovir (VALTREX) 500 MG tablet Take 1 tablet (500 mg total) by mouth daily. 04/23/17   Nche, Charlene Brooke, NP  zonisamide (ZONEGRAN) 25 MG capsule TK 4 CS PO QD HS 03/12/17   [provider]    Family History Family History  Problem Relation Age of Onset  . Emphysema Father   . Dementia Mother   . Diabetes Maternal Grandmother   . Diabetes Maternal Aunt   . Diabetes Unknown        mat. cousin  . Heart disease Brother   . Colon cancer Neg Hx   . Allergic rhinitis Neg Hx   . Angioedema Neg Hx   . Asthma Neg Hx   . Atopy Neg Hx   . Eczema Neg Hx   . Immunodeficiency Neg Hx   . Urticaria Neg Hx     Social History Social History  Substance Use Topics  . Smoking status: Current Every Day Smoker    Packs/day: 1.00    Years: 39.00    Types: Cigarettes    Start date: 08/06/1977  . Smokeless tobacco: Never Used     Comment: Currently smoking 1 ppd.  Quit once for 4 months.   . Alcohol use No     Allergies   Bee venom; Codeine; Ibuprofen; Levofloxacin; Propoxyphene n-acetaminophen;  Toradol [ketorolac tromethamine]; Ultram [tramadol hcl]; Hydrocodone-acetaminophen; and Latex   Review of Systems Review of Systems  All other systems reviewed and are negative.    Physical Exam Updated Vital Signs BP (!) 141/92 (BP Location: Left Arm)   Pulse 91   Temp 98.4 F (36.9 C) (Oral)   Resp 14   Ht 1.626 m (5\' 4" )   Wt 62.6 kg (138 lb)   SpO2 98%   BMI 23.69 kg/m   Physical Exam  Constitutional: She appears well-developed and well-nourished.  HENT:  Head: Normocephalic and atraumatic.  Right Ear: External ear normal.  Left Ear: External ear normal.  Nose: Nose normal.  Mouth/Throat: Oropharynx is clear and moist.  Eyes: Pupils are equal, round, and reactive to light. EOM are normal.  Neck: Normal range of motion.  Musculoskeletal: Normal range of motion. She exhibits tenderness.       Arms: No redness, swelling, warmth Pulses intact Full active range of motion.  Nursing note and vitals reviewed.    ED Treatments / Results  Labs (all labs ordered are listed, but only abnormal results are displayed) Labs Reviewed - No data to display  EKG  EKG Interpretation None       Radiology Dg Elbow Complete Right  Result Date: 06/08/2017 CLINICAL DATA:  Blunt trauma to the elbow 2 weeks ago with persistent pain, initial encounter EXAM: RIGHT ELBOW - COMPLETE 3+ VIEW COMPARISON:  05/27/2017 FINDINGS: No acute fracture or dislocation is noted. Mild spurring is noted arising from the radial head. No soft tissue abnormality is seen. IMPRESSION: Mild degenerative change without acute abnormality. Electronically Signed   By: Inez Catalina M.D.   On: 06/08/2017 14:58   Dg Humerus Right  Result Date: 06/08/2017 CLINICAL DATA:  Right shoulder pain, no known injury, initial encounter EXAM: RIGHT HUMERUS - 2+ VIEW COMPARISON:  None. FINDINGS: The humerus is well visualized and reveals no acute fracture or dislocation. Mild degenerative changes at the acromioclavicular  joint are seen. No soft tissue abnormality is noted. IMPRESSION: Mild degenerative change without acute abnormality. Electronically Signed   By: Inez Catalina M.D.   On: 06/08/2017 14:59    Procedures Procedures (including critical care time)  Medications Ordered in ED Medications  acetaminophen (TYLENOL) tablet 650 mg (not administered)  cyclobenzaprine (FLEXERIL) tablet 5 mg (5 mg Oral Given 06/08/17 1423)     Initial Impression / Assessment and Plan / ED Course  I have reviewed the triage vital signs and the nursing notes.  Pertinent labs & imaging results that were available during my care of the patient were reviewed by me and considered in my medical decision making (see chart for details).      61 year old female complaining of right elbow pain. There is no evidence of effusion, infection, or trauma. Patient continues to complain of pain here and is given Flexeril without relief. She is allergic to most narcotics per her allergy TAB. However, she tells me that she can take Percocet. She'll be given one dose here in the ED. She is also allergic to nonsteroidals. She is advised regarding follow-up and return precautions and voices understanding.. Final Clinical Impressions(s) / ED Diagnoses   Final diagnoses:  Right elbow pain    New Prescriptions New Prescriptions   No medications on file     Pattricia Boss, MD 06/08/17 1527

## 2017-06-08 NOTE — ED Triage Notes (Signed)
Pt states for a whole moth and is having pain from right elbow up to right shoulder.  No OTC hurts.  Pt has had previous surgery  To right elbow post fall and states it hurts to open or touch the elbow,

## 2017-06-08 NOTE — Discharge Instructions (Signed)
Please follow up with Dr. Raeford Razor.

## 2017-06-08 NOTE — Progress Notes (Signed)
[name] [address] [place], [place] [address] Phone: [phone] Subjective:    I'm seeing this patient by the request  of:    CC: Calf pain bilaterally worse at night  IRC:[id number]  Lori [name] is a 61 y.o. female coming in with complaint of leg pain. Went to ER last night about her elbow. She hit her elbow on the freezer. She was given 2 percocet For pain which helped. The patient explains that her legs feel tight. The tightness makes it hard for her to walk.   Onset- Chronic Location- calf muscles Duration- worse at night Character-dull, throbbing aching Aggravating factors- Reliving factors- Percocet Therapies tried- Oral medication  Severity-5 out of 10   patient states that the cramping seems to be the worse thing at this time. Does not remember any true injury. Only happens at night.  Past Medical History:  Diagnosis Date  . Acute MI ([place])    x3 - by report only. She has had 3 negative Myoview stress test with no evidence of prior infarct.  . Allergic rhinitis   . Allergy   . Anemia   . Ankle fracture 05/2016  . Anxiety   . Arthritis   . Asthma   . Barrett esophagus 2007  . Chest pain 05-02-2009   echo  EF 55%  . COPD (chronic obstructive pulmonary disease) with chronic bronchitis (HCC)    & Emphysema  . Depression   . Duodenitis without mention of hemorrhage 2007  . Esophageal reflux 2007  . Esophageal stricture   . Full dentures   . H/O hiatal hernia   . Hiatal hernia 1025,8527  . Hyperlipidemia   . Multiple fractures    from falls, fx rt. elbow, fx left wrist, bilateral ankles  . Pneumonia 10/2016  . Ulcer    Past Surgical History:  Procedure Laterality Date  . ABDOMINAL HYSTERECTOMY     BSO  . APPENDECTOMY    . CESAREAN SECTION     x 2  . CHOLECYSTECTOMY    . CHONDROPLASTY Left 01/06/2015   Procedure: CHONDROPLASTY;  Surgeon: Marybelle Killings, MD;  Location: [facility];  Service: Orthopedics;   Laterality: Left;  . COLONOSCOPY    . ELBOW FRACTURE SURGERY     right  . KNEE ARTHROSCOPY WITH EXCISION PLICA Left 7/82/4235   Procedure: KNEE ARTHROSCOPY WITH EXCISION PLICA;  Surgeon: Marybelle Killings, MD;  Location: [facility];  Service: Orthopedics;  Laterality: Left;  . Lower Extremity Arterial Dopplers  07/06/2013   RABI 1.0, LABI 1.1.; No evidence of significant vascular atherogenic plaque  . NECK SURGERY    . NM MYOVIEW LTD  09/2014   LOW RISK. Normal EF of 65% with no regional wall motion abnormalities. No ischemia or infarction.  . TONSILLECTOMY    . WRIST FRACTURE SURGERY     left, has plate   Social History   Social History  . Marital status: Married    Spouse name: [name]  . Number of children: 2  . Years of education: HS   Occupational History  . disable    Social History Main Topics  . Smoking status: Current Every Day Smoker    Packs/day: 1.00    Years: 39.00    Types: Cigarettes    Start date: 08/06/1977  . Smokeless tobacco: Never Used     Comment: Currently smoking 1 ppd.  Quit once for 4 months.   . Alcohol use  No  . Drug use: No  . Sexual activity: No   Other Topics Concern  . None   Social History Narrative   Patient lives at home spouse [name].  Has 2 daughters (47 & 10).   Currently "disabled".   Caffeine Use: tea 4 glasses daily.    Smokes ~1 1/2 PPD  05/15/15   Walks everyday - no set routine.      [facility] (12/03/16):   Originally from [place]. 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 Currently has a dog. No bird exposure. No known mold exposure.    Allergies  Allergen Reactions  . Bee Venom Swelling  . Codeine Nausea Only    CAUSES ULCERS  . Ibuprofen Nausea And Vomiting  . Levofloxacin Nausea Only  . Propoxyphene N-Acetaminophen Nausea Only  . Toradol [Ketorolac Tromethamine] Nausea And Vomiting  . Ultram [Tramadol Hcl]   . Hydrocodone-Acetaminophen Nausea Only    REACTION: "sick"  .  Latex Rash   Family History  Problem Relation Age of Onset  . Emphysema Father   . Dementia Mother   . Diabetes Maternal Grandmother   . Diabetes Maternal Aunt   . Diabetes Unknown        mat. cousin  . Heart disease Brother   . Colon cancer Neg Hx   . Allergic rhinitis Neg Hx   . Angioedema Neg Hx   . Asthma Neg Hx   . Atopy Neg Hx   . Eczema Neg Hx   . Immunodeficiency Neg Hx   . Urticaria Neg Hx      Past medical history, social, surgical and family history all reviewed in electronic medical record.  No pertanent information unless stated regarding to the chief complaint.   Review of Systems:Review of systems updated and as accurate as of 06/09/17  Pain positives patient states headache, visual changes, nausea, vomiting, diarrhea, constipation, dizziness, abdominal pain, skin rash, fevers, chills, night sweats, weight loss, swollen lymph nodes, body aches, joint swelling, muscle aches, chest pain, shortness of breath, mood changes.   Objective  Blood pressure 100/80, pulse 85, height 5\' 4"  (1.626 m), weight 142 lb (64.4 kg), SpO2 98 %. Systems examined below as of 06/09/17   General: No apparent distress alert and oriented x3 mood and affect normal, dressed appropriately.  HEENT: Pupils equal, extraocular movements intact  Respiratory: Patient's speak in full sentences and Mildly short of breath Cardiovascular: No lower extremity edema, non tender, no erythema  Skin: Warm dry intact with no signs of infection or rash on extremities or on axial skeleton.  Abdomen: Soft nontender  Neuro: Cranial nerves II through XII are intact, neurovascularly intact in all extremities with 2+ DTRs and 2+ pulses.  Lymph: No lymphadenopathy of posterior or anterior cervical chain or axillae bilaterally.  Gait Antalgic MSK:  Moderate tender with full range of motion and good stability and symmetric strength and tone of shoulders,  wrist, hip, knee and ankles bilaterally. Severe arthritic  changes of multiple joints      Impression and Recommendations:     This case required medical decision making of moderate complexity.      Note: This dictation was prepared with Dragon dictation along with smaller phrase technology. Any transcriptional errors that result from this process are unintentional.

## 2017-06-09 ENCOUNTER — Telehealth: Payer: Self-pay | Admitting: Family Medicine

## 2017-06-09 ENCOUNTER — Ambulatory Visit: Payer: Medicare Other | Admitting: Family Medicine

## 2017-06-09 ENCOUNTER — Encounter: Payer: Self-pay | Admitting: Family Medicine

## 2017-06-09 ENCOUNTER — Ambulatory Visit (INDEPENDENT_AMBULATORY_CARE_PROVIDER_SITE_OTHER): Payer: Medicare Other | Admitting: Family Medicine

## 2017-06-09 DIAGNOSIS — S5001XA Contusion of right elbow, initial encounter: Secondary | ICD-10-CM | POA: Diagnosis not present

## 2017-06-09 DIAGNOSIS — R252 Cramp and spasm: Secondary | ICD-10-CM

## 2017-06-09 DIAGNOSIS — S5000XA Contusion of unspecified elbow, initial encounter: Secondary | ICD-10-CM | POA: Insufficient documentation

## 2017-06-09 DIAGNOSIS — M62830 Muscle spasm of back: Secondary | ICD-10-CM

## 2017-06-09 MED ORDER — TIZANIDINE HCL 2 MG PO CAPS: 2.0000 mg | ORAL_CAPSULE | Freq: Three times a day (TID) | ORAL | 0 refills | Status: DC | PRN

## 2017-06-09 NOTE — Telephone Encounter (Signed)
Per dr Tamala Julian. Sent in zanalfex 2mg  into pharmacy.

## 2017-06-09 NOTE — Assessment & Plan Note (Signed)
Patient is having leg cramping. Has a history of anemia. Likely second due to more of an iron deficiency. We discussed over-the-counter medications. Discuss further workup for an inclusion her back and possibly peripheral neuropathy neck and be contributing. Patient does not want any further workup. Patient's medical C seems to be below average. Discussed with her as well as her friend who is at bedside. Many different medical problems. Patient was fairly adamant that she does feel that pain medication is helpful and we declined to fill it. Continue all other medications. Follow-up in 2-4 weeks.

## 2017-06-09 NOTE — Telephone Encounter (Signed)
Patient states that Dr. Tamala Julian was to send a script to her pharmacy ([facility]) today.  Does not know the name of the medication. States pharmacy has not received.

## 2017-06-09 NOTE — Patient Instructions (Signed)
Good to see you  You have a bruise elbow.  Cramping of legs I think from iron  Ice is your friend. Ice 20 minutes 2 times daily. Usually after activity and before bed. pennsaid pinkie amount topically 2 times daily as needed.   Arnica lotion 2 times a day  Stay active.  Over the counter you need  Vitamin D 2000 IU daily  Iron 65mg  daily with 500mg  of vitamin C  Turmeric 500mg  daily  It will take 2 take 2 weeks if you need it See me again in 3-4 weeks.

## 2017-06-09 NOTE — Assessment & Plan Note (Signed)
Condition noted. Discussed with patient and this is under all. X-rays were reviewed by me and did not show any type of bony normality. Discussed over-the-counter medications. Discussed that she does not need any type of pain medications. Patient was adamant that anterior cruciate ligament ligament seem to help.

## 2017-06-10 NOTE — Telephone Encounter (Signed)
Pt called and her insurance did not covertizanidine (ZANAFLEX) 2 MG capsule  It was $86 dollars, she would ike something else called in  Please advise and call back

## 2017-06-11 MED ORDER — CYCLOBENZAPRINE HCL 5 MG PO TABS: 5.0000 mg | ORAL_TABLET | Freq: Two times a day (BID) | ORAL | 1 refills | Status: DC | PRN

## 2017-06-11 NOTE — Addendum Note (Signed)
Addended by: Douglass Rivers T on: 06/11/2017 02:06 PM   Modules accepted: Orders

## 2017-06-11 NOTE — Telephone Encounter (Signed)
Spoke to pt, advised her flexeril has been sent into her pharmacy to replace the zanaflex.

## 2017-06-11 NOTE — Telephone Encounter (Signed)
Patient has called back in regard?  °

## 2017-06-12 ENCOUNTER — Telehealth: Payer: Self-pay | Admitting: Nurse Practitioner

## 2017-06-12 IMAGING — CR DG RIBS W/ CHEST 3+V*R*
3 series · 3 of 3 positions shown · non-contrast
Comparison: 01/16/2014

CLINICAL DATA: Worsening lower posterior rib pain since falling
onto back 04/07/2015. The pain is pleuritic.

EXAM:
RIGHT RIBS AND CHEST - 3+ VIEW

[x chest ap (1 of 3)]
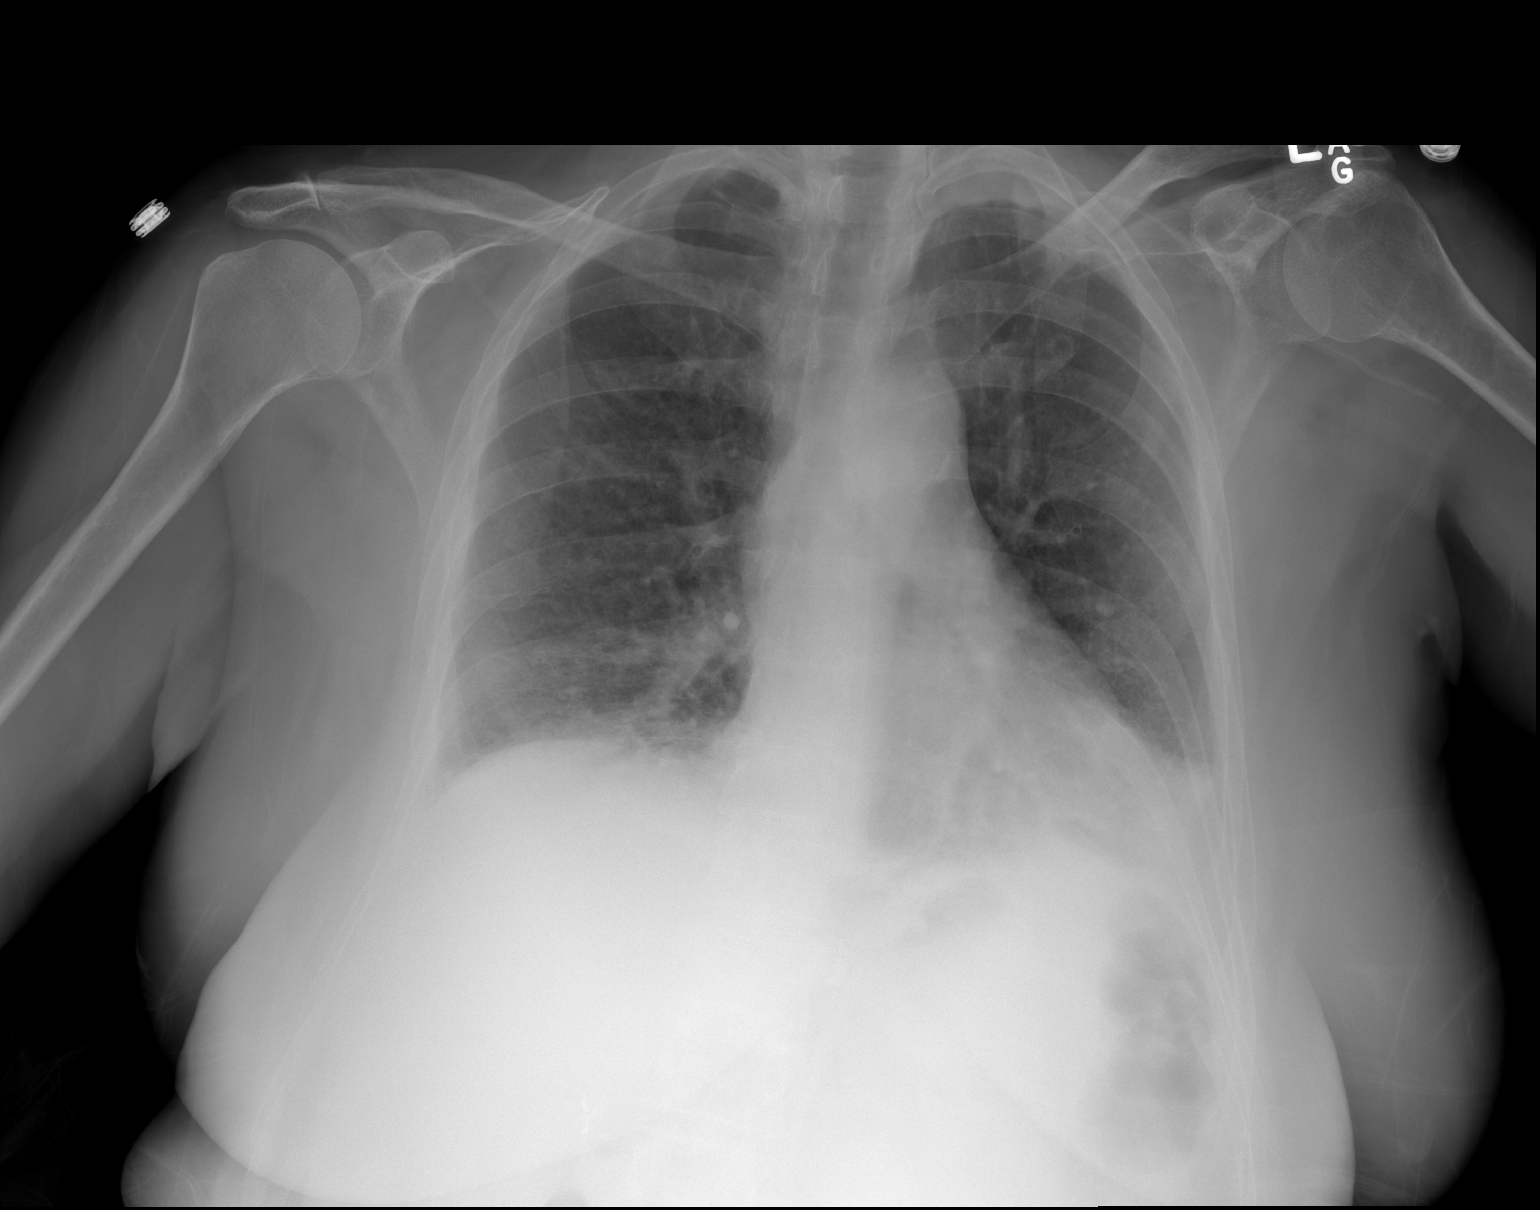

[x chest ap (2 of 3)]
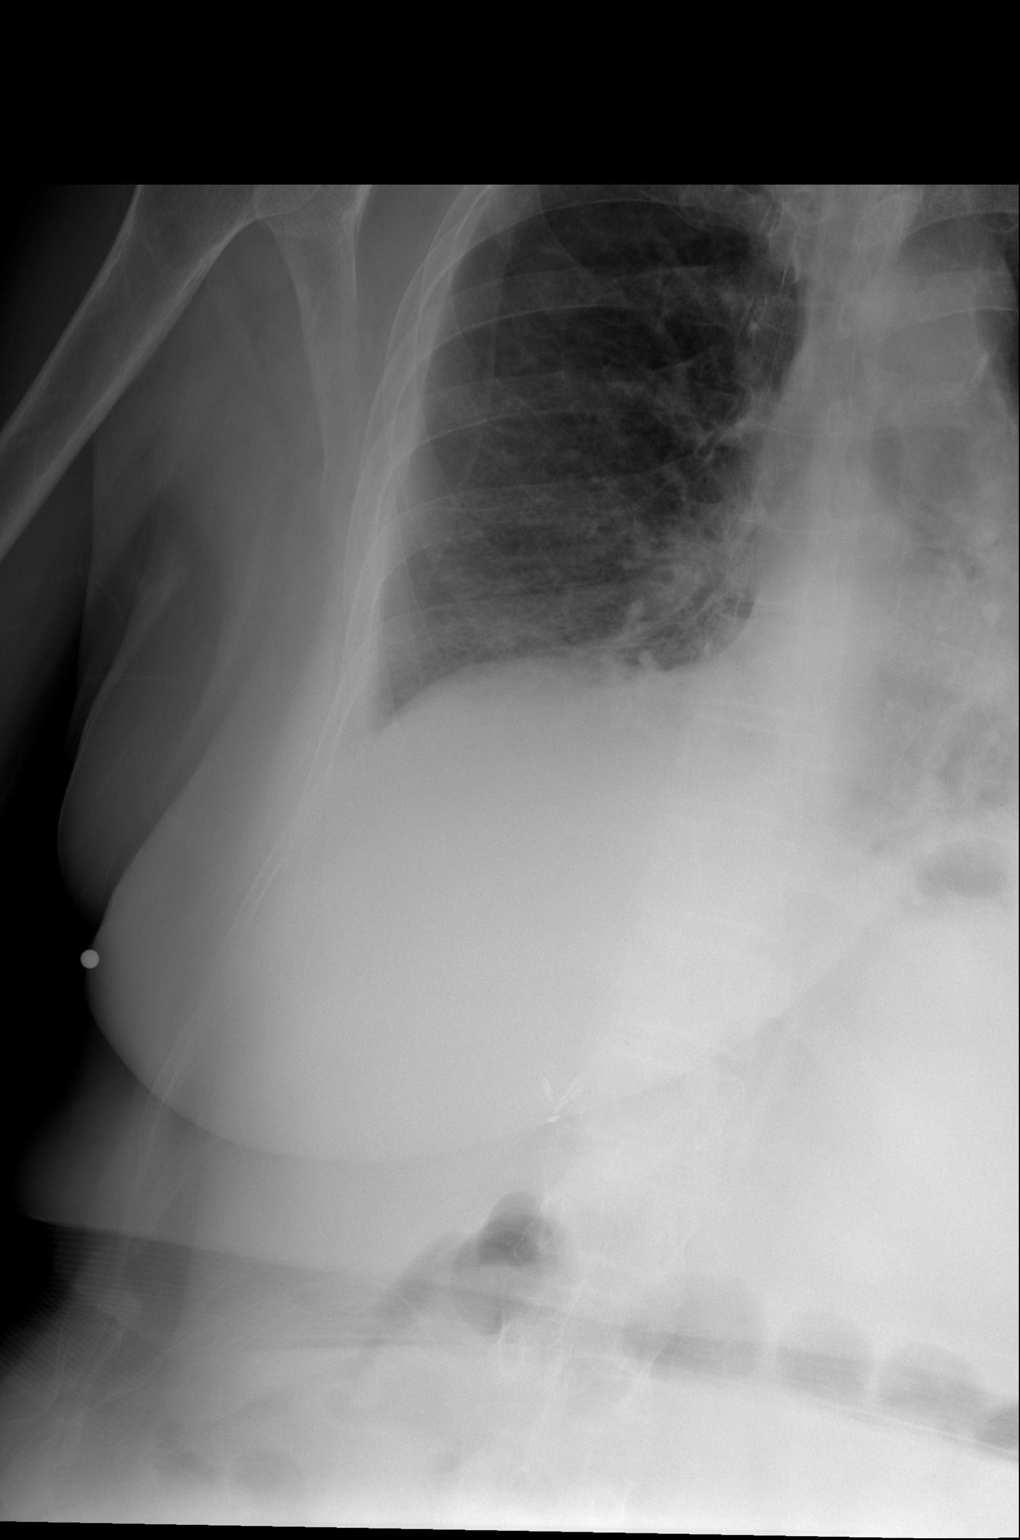

[x chest ap (3 of 3)]
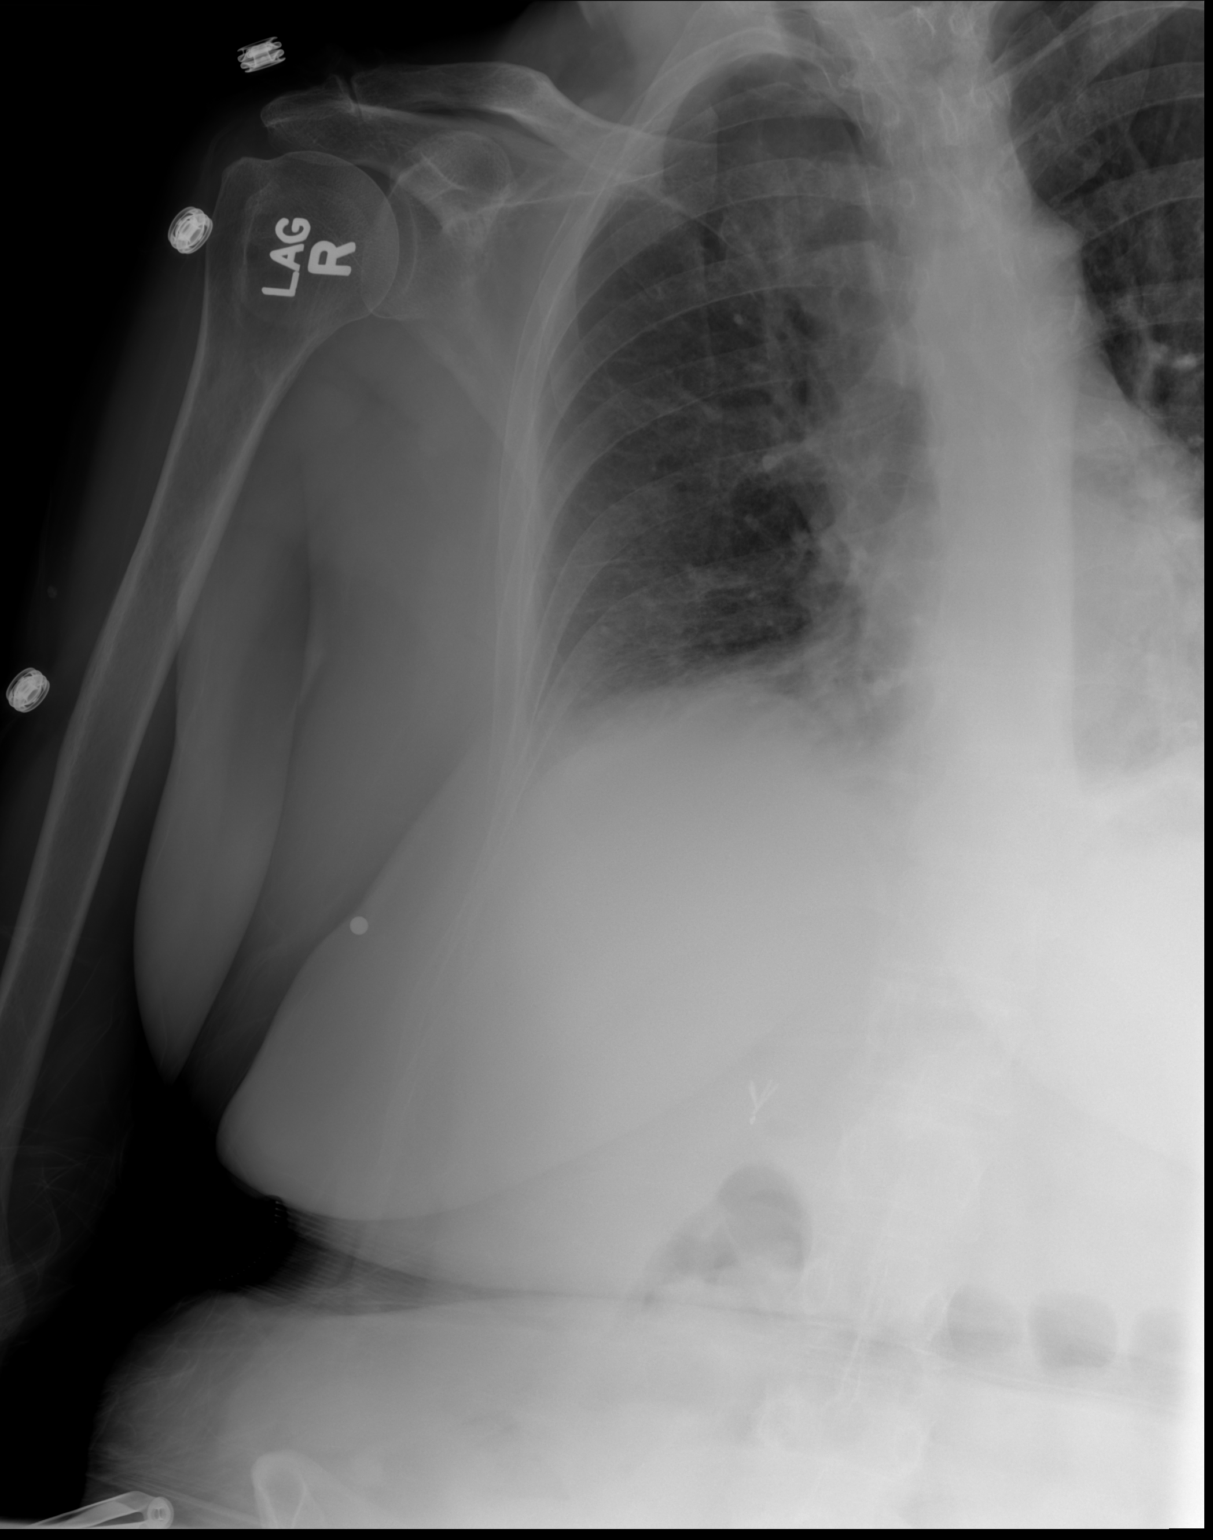

[3 of 3 positions shown; findings below may reference images not displayed]

FINDINGS: No fracture or other bone lesions are seen involving the ribs. There
is no evidence of pneumothorax or pleural effusion. Mild basilar
opacities are present, accentuated by the shallow degree of
inspiration. This likely represents atelectasis but infectious
infiltrate is not entirely excluded. Heart size and mediastinal
contours are within normal limits.
IMPRESSION: Negative for displaced rib fracture. Mild bibasilar
atelectatic-appearing opacities. Cannot exclude infectious
infiltrate.

## 2017-06-12 NOTE — Telephone Encounter (Signed)
Pt called asking if you will except as a transfer from [place],  Please advise

## 2017-06-12 NOTE — Telephone Encounter (Signed)
Ok with me 

## 2017-06-12 NOTE — Telephone Encounter (Signed)
Called pt and informed of MD response, Pt will come into the office to set up an appointment later on when she has an appointment upstairs

## 2017-06-15 IMAGING — CT CT ABD-PELV W/ CM
2 of 5 series · 15 of 46 positions shown, 17 images · IV contrast (OMNIPAQUE 300)
Comparison: CT of the abdomen and pelvis on 10/07/2012

CLINICAL DATA: Fall 2 weeks ago. Right flank pain. Vomiting.
Pleuritic pain and cough. Initial encounter.

EXAM:
CT ABDOMEN AND PELVIS WITH CONTRAST
TECHNIQUE: Multidetector CT imaging of the abdomen and pelvis was performed
using the standard protocol following bolus administration of
intravenous contrast.
CONTRAST:  100mL OMNIPAQUE IOHEXOL 300 MG/ML  SOLN

[Series 2: abd/pel with · axial · 0.68mm/px · z∈[-671,-261]mm · 12 of 92 slices shown, 14 images]
[im 5/92  soft-tissue]
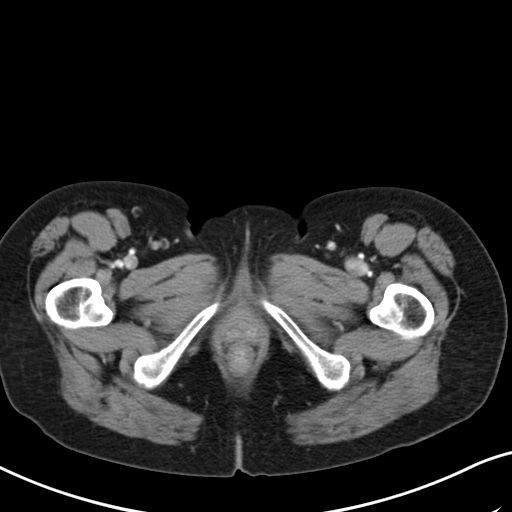
[im 5/92  bone]
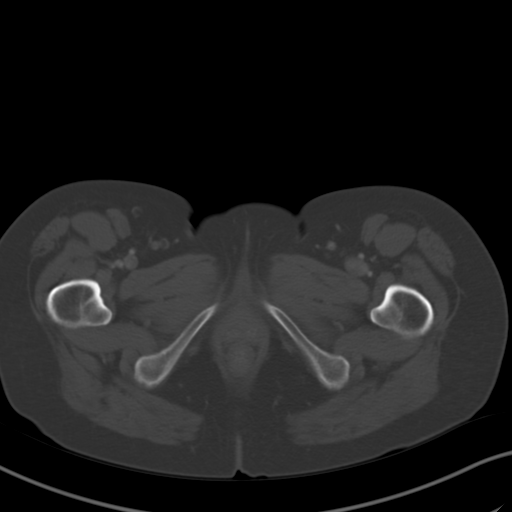
[im 14/92  soft-tissue]
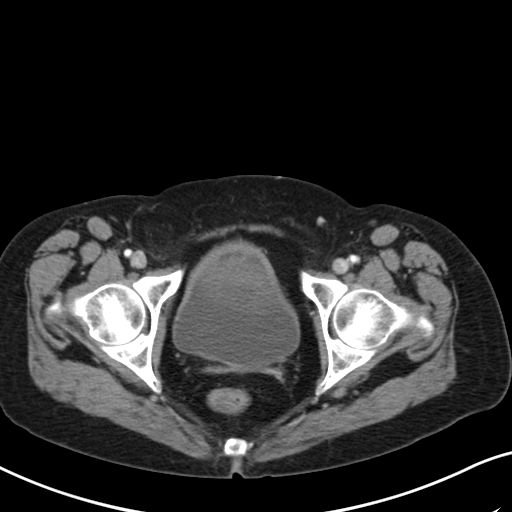
[im 19/92  soft-tissue]
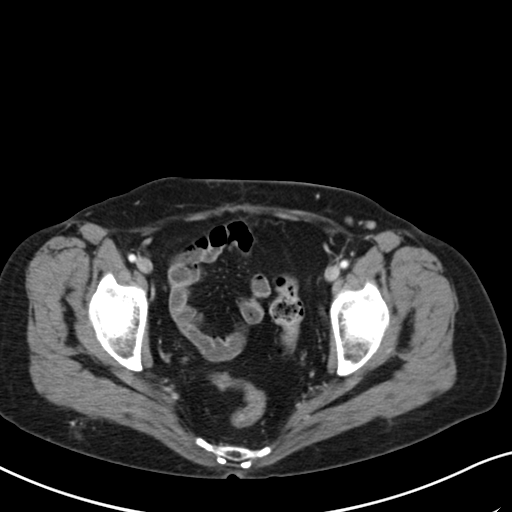
[im 28/92  soft-tissue]
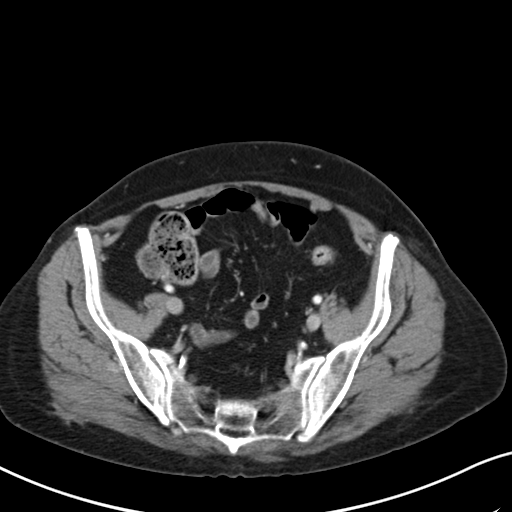
[im 37/92  soft-tissue]
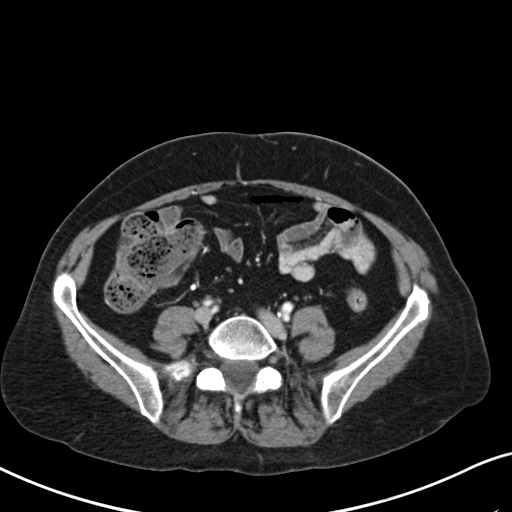
[im 41/92  soft-tissue]
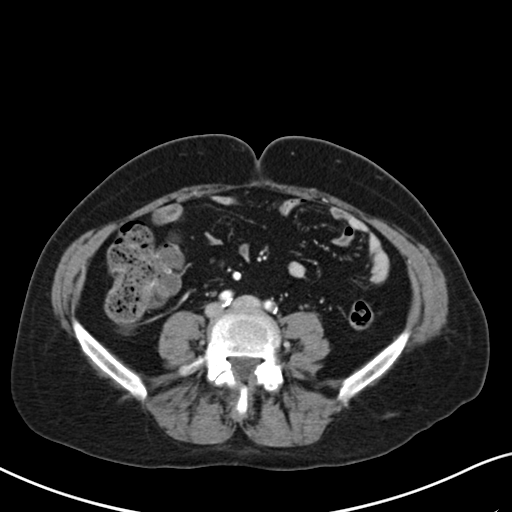
[im 51/92  soft-tissue]
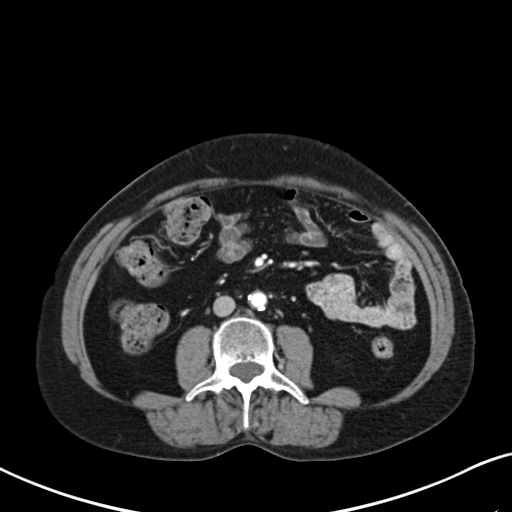
[im 55/92  soft-tissue]
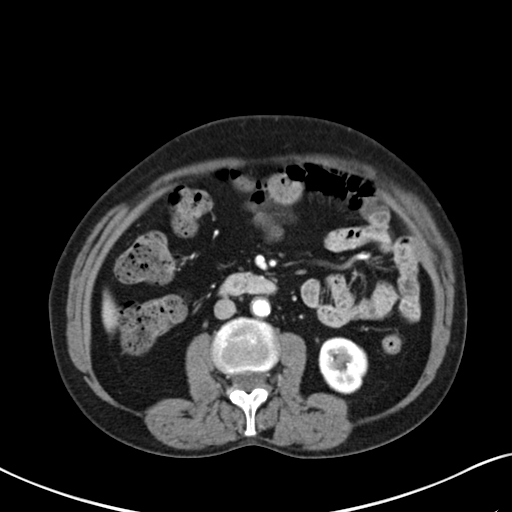
[im 64/92  soft-tissue]
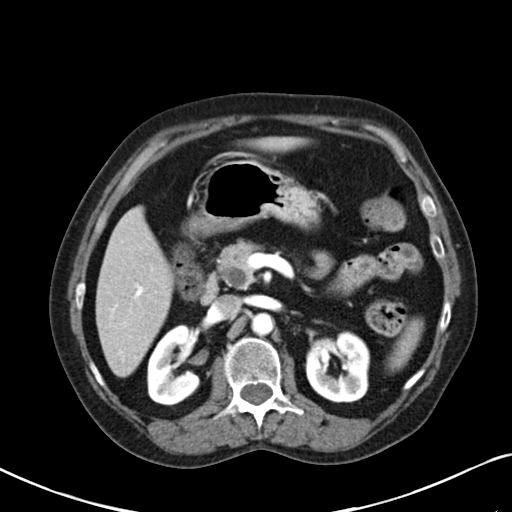
[im 64/92  bone]
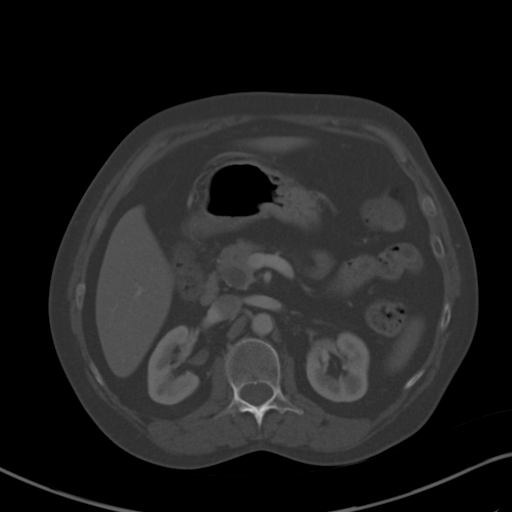
[im 73/92  soft-tissue]
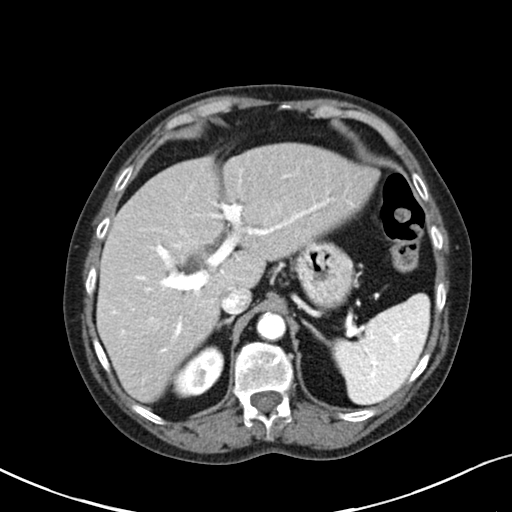
[im 78/92  soft-tissue]
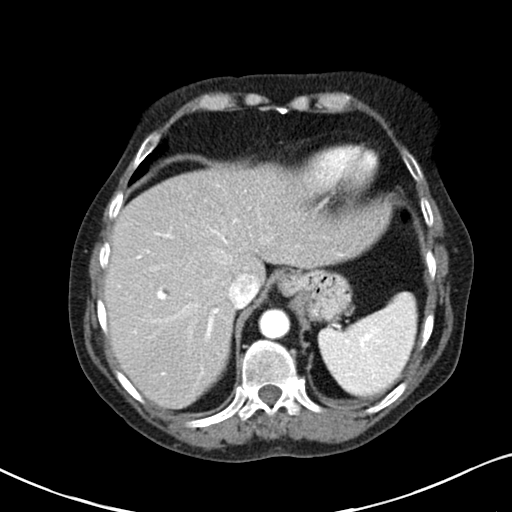
[im 87/92  soft-tissue]
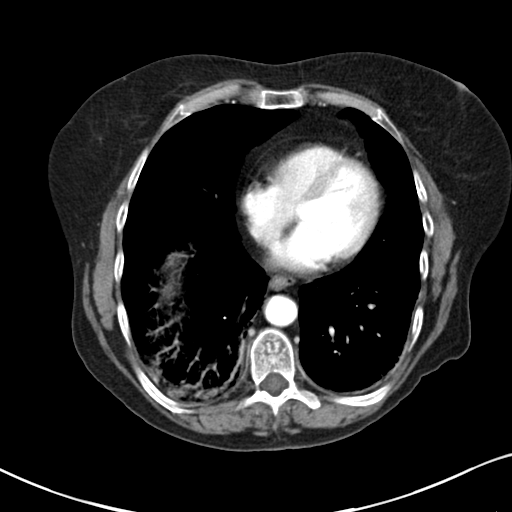

[Series 3: coronal a/|p · coronal · 0.61mm/px · 3 of 84 slices shown]
[im 28/84  soft-tissue]
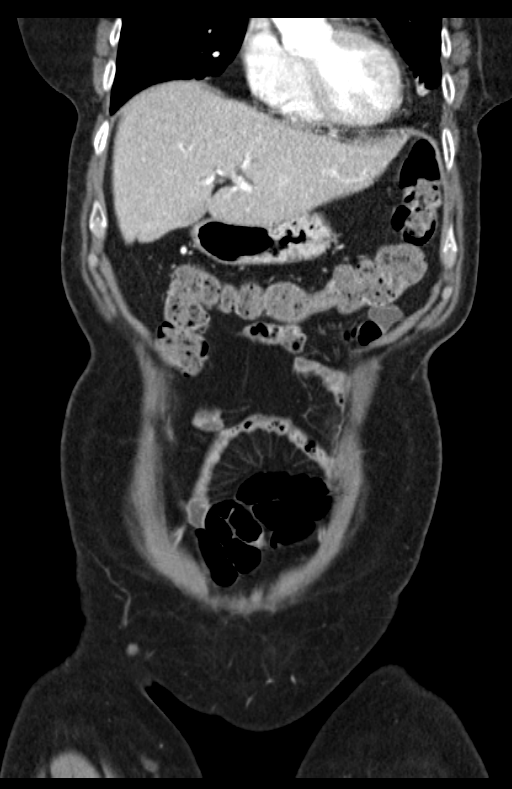
[im 37/84  soft-tissue]
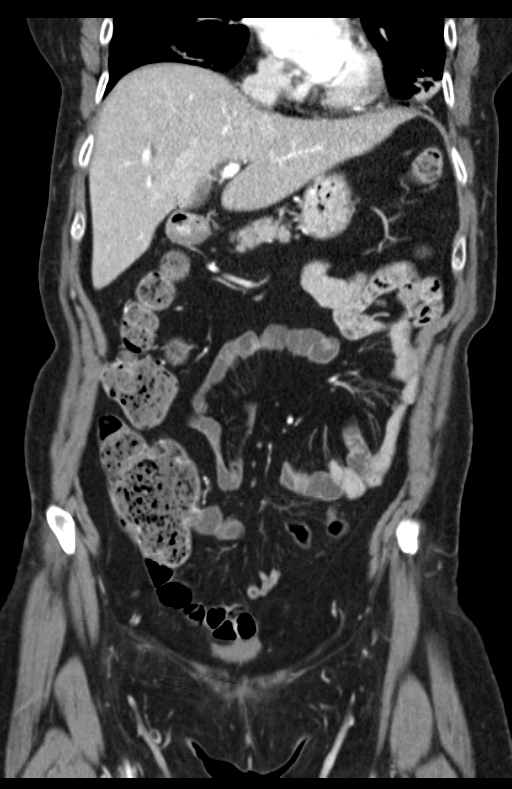
[im 47/84  soft-tissue]
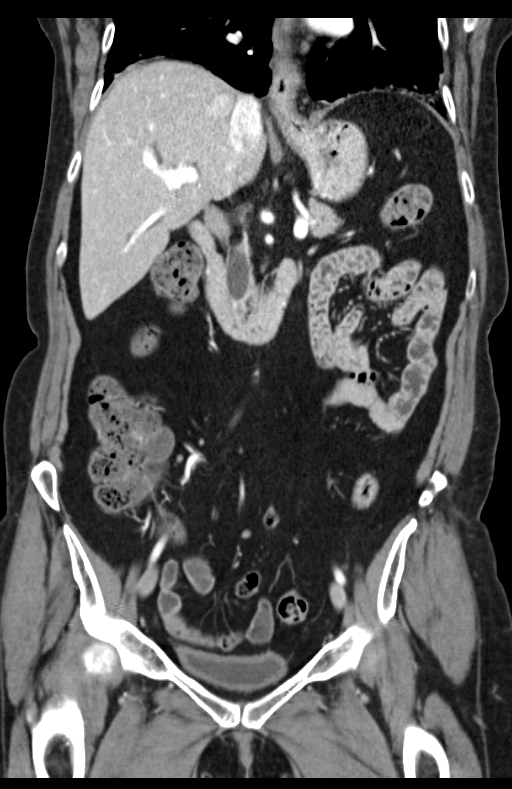

[15 of 46 positions shown; findings below may reference images not displayed]

FINDINGS: Visualized lung bases show atelectasis versus infiltrate at the
posterior right lung base and mild atelectasis at the left lung
base. No visualized basilar pneumothorax or pleural fluid. No lower
rib fractures identified.

The liver is unremarkable. Stable dilatation of the common bile duct
and central hepatic ducts post cholecystectomy. The pancreas,
spleen, adrenal glands and kidneys are unremarkable. No abnormal
fluid collections.

Bowel loops are within normal limits. No evidence of obstruction,
free air or inflammation. The bladder is unremarkable. The uterus
has been removed. Bony structures show no evidence of fracture. No
bony lesions are seen.
IMPRESSION: 1. Atelectasis versus infiltrate at the posterior right lung base.
Mild atelectasis at the left lung base.
2. Stable post cholecystectomy dilatation of the extrahepatic bile
ducts.
3. No acute findings in the abdomen or pelvis.

## 2017-06-17 ENCOUNTER — Ambulatory Visit (INDEPENDENT_AMBULATORY_CARE_PROVIDER_SITE_OTHER): Payer: Medicare Other | Admitting: *Deleted

## 2017-06-17 DIAGNOSIS — J309 Allergic rhinitis, unspecified: Secondary | ICD-10-CM | POA: Diagnosis not present

## 2017-06-18 ENCOUNTER — Other Ambulatory Visit: Payer: Self-pay | Admitting: Nurse Practitioner

## 2017-06-18 ENCOUNTER — Telehealth: Payer: Self-pay | Admitting: Nurse Practitioner

## 2017-06-18 DIAGNOSIS — K3184 Gastroparesis: Secondary | ICD-10-CM

## 2017-06-18 MED ORDER — PROMETHAZINE HCL 12.5 MG PO TABS: 12.5000 mg | ORAL_TABLET | Freq: Three times a day (TID) | ORAL | 0 refills | Status: DC | PRN

## 2017-06-18 NOTE — Telephone Encounter (Signed)
Rx sent 

## 2017-06-18 NOTE — Telephone Encounter (Signed)
Patient is requesting script for nausea.

## 2017-06-19 NOTE — Telephone Encounter (Signed)
Pt is aware.  

## 2017-06-24 ENCOUNTER — Encounter: Payer: Self-pay | Admitting: Pulmonary Disease

## 2017-06-24 ENCOUNTER — Ambulatory Visit
Admission: RE | Admit: 2017-06-24 | Discharge: 2017-06-24 | Disposition: A | Discharge status: Home / Self Care | Payer: Medicare Other | Source: Ambulatory Visit | Attending: Pulmonary Disease | Admitting: Pulmonary Disease

## 2017-06-24 ENCOUNTER — Other Ambulatory Visit (INDEPENDENT_AMBULATORY_CARE_PROVIDER_SITE_OTHER): Payer: Medicare Other

## 2017-06-24 ENCOUNTER — Ambulatory Visit (INDEPENDENT_AMBULATORY_CARE_PROVIDER_SITE_OTHER)
Admission: RE | Admit: 2017-06-24 | Discharge: 2017-06-24 | Disposition: A | Discharge status: Home / Self Care | Payer: Medicare Other | Source: Ambulatory Visit | Attending: Pulmonary Disease | Admitting: Pulmonary Disease

## 2017-06-24 ENCOUNTER — Ambulatory Visit (INDEPENDENT_AMBULATORY_CARE_PROVIDER_SITE_OTHER): Payer: Medicare Other | Admitting: Pulmonary Disease

## 2017-06-24 ENCOUNTER — Other Ambulatory Visit: Payer: Self-pay | Admitting: Pulmonary Disease

## 2017-06-24 VITALS — BP 136/76 | HR 79 | Ht 64.0 in | Wt 137.4 lb

## 2017-06-24 DIAGNOSIS — F17200 Nicotine dependence, unspecified, uncomplicated: Secondary | ICD-10-CM

## 2017-06-24 DIAGNOSIS — R112 Nausea with vomiting, unspecified: Secondary | ICD-10-CM

## 2017-06-24 DIAGNOSIS — J302 Other seasonal allergic rhinitis: Secondary | ICD-10-CM | POA: Diagnosis not present

## 2017-06-24 DIAGNOSIS — K219 Gastro-esophageal reflux disease without esophagitis: Secondary | ICD-10-CM

## 2017-06-24 DIAGNOSIS — J449 Chronic obstructive pulmonary disease, unspecified: Secondary | ICD-10-CM | POA: Diagnosis not present

## 2017-06-24 DIAGNOSIS — F17210 Nicotine dependence, cigarettes, uncomplicated: Secondary | ICD-10-CM

## 2017-06-24 DIAGNOSIS — R05 Cough: Secondary | ICD-10-CM | POA: Diagnosis not present

## 2017-06-24 LAB — COMPREHENSIVE METABOLIC PANEL
ALBUMIN: 4.3 g/dL (ref 3.5–5.2)
ALT: 11 U/L (ref 0–35)
AST: 12 U/L (ref 0–37)
Alkaline Phosphatase: 85 U/L (ref 39–117)
BUN: 7 mg/dL (ref 6–23)
CALCIUM: 9.2 mg/dL (ref 8.4–10.5)
CHLORIDE: 106 meq/L (ref 96–112)
CO2: 23 meq/L (ref 19–32)
Creatinine, Ser: 0.93 mg/dL (ref 0.40–1.20)
GFR: 65.17 mL/min (ref >60.00)
Glucose, Bld: 97 mg/dL (ref 70–99)
POTASSIUM: 3.9 meq/L (ref 3.5–5.1)
SODIUM: 136 meq/L (ref 135–145)
Total Bilirubin: 0.4 mg/dL (ref 0.2–1.2)
Total Protein: 6.8 g/dL (ref 6.0–8.3)

## 2017-06-24 MED ORDER — RANITIDINE HCL 150 MG PO TABS: 150.0000 mg | ORAL_TABLET | Freq: Two times a day (BID) | ORAL | 1 refills | Status: DC

## 2017-06-24 MED ORDER — ONDANSETRON HCL 4 MG PO TABS: 4.0000 mg | ORAL_TABLET | Freq: Four times a day (QID) | ORAL | 0 refills | Status: DC | PRN

## 2017-06-24 NOTE — Addendum Note (Signed)
Addended by: Della Goo C on: 06/24/2017 09:35 AM   Modules accepted: Orders

## 2017-06-24 NOTE — Patient Instructions (Addendum)
   Continue using your ProAir as needed.  Try using nicotine gum to help you quit smoking. Remember not to chew it like gum.   If you keep having nausea & vomiting talk with your primary doctor.  TESTS ORDERED: 1. CXR PA/LAT today - diagnosis cough 2. Serum CMP today

## 2017-06-24 NOTE — Progress Notes (Signed)
Subjective:    Patient ID: Lori [name], female    DOB: 1955-11-07, 61 y.o.   MRN: [id number]  C.C.:  Follow-up for COPD Mixed Type w/ Emphysema, Chronic Seasonal Allergic Rhinitis, GERD & Tobacco Use Disorder.   HPI She reports she has had significant nausea and emesis with severe reflux. This has caused her problems keeping down her medication. She reports she has had symptoms of increased reflux for the last 3 months.   COPD mixed type with emphysema: Treated at the last 2 appointments within the last year with prednisone. Previously patient was using Dulera and Spiriva but then was transitioned to Symbicort 80/4.5. Patient also on Singulair. She reports she is producing a yellow mucus since her reflux started. She is coughing at night as well. She isn't wheezing. She reports she has only been taking ProAir for her coughing. She is using it twice daily.   Chronic seasonal allergic rhinitis: Follows with allergy/immunology. Currently on immunotherapy. Prescribed Singulair. She reports she previously was adherent with her sinuses. She denies any significant change to her sinus congestion or drainage.   GERD: Prescribed Dexilant and Zantac.  She reports she hasn't been able to get her Zantac until December. She hasn't been taking Zantac either. She reports she has had reflux into the back of her throat.   Tobacco use disorder: Secondhand smoke exposure through her husband within the home. At last appointment patient was still smoking 1 pack per day. Previously she has tried using nicotine patches. She has cut down to 3 cigarettes per day. She reports she does want to quit. She is smoking after she eats.   Review of Systems She is having some chest pain with her reflux. No significant chest tightness. She is having hot & cold chills but no fever. No rashes or abnormal bruising.   Allergies  Allergen Reactions  . Bee Venom Swelling  . Codeine Nausea Only    CAUSES ULCERS  . Ibuprofen  Nausea And Vomiting  . Levofloxacin Nausea Only  . Propoxyphene N-Acetaminophen Nausea Only  . Toradol [Ketorolac Tromethamine] Nausea And Vomiting  . Ultram [Tramadol Hcl]   . Hydrocodone-Acetaminophen Nausea Only    REACTION: "sick"  . Latex Rash    Current Outpatient Prescriptions on File Prior to Visit  Medication Sig Dispense Refill  . albuterol (PROVENTIL HFA;VENTOLIN HFA) 108 (90 Base) MCG/ACT inhaler Inhale 2-4 puffs as needed for chest tightness, wheezing or cough. 1 Inhaler 2  . benzonatate (TESSALON) 100 MG capsule Take 1 capsule (100 mg total) by mouth 3 (three) times daily as needed for cough. 20 capsule 0  . dexlansoprazole (DEXILANT) 60 MG capsule Take 1 capsule (60 mg total) by mouth daily. 90 capsule 0  . dextromethorphan-guaiFENesin (MUCINEX DM) 30-600 MG 12hr tablet Take 1 tablet by mouth 2 (two) times daily as needed for cough. 14 tablet 0  . EPINEPHrine 0.3 mg/0.3 mL IJ SOAJ injection Use as directed for severe allergic reaction 2 Device 1  . montelukast (SINGULAIR) 10 MG tablet Take 1 tablet (10 mg total) by mouth at bedtime. 30 tablet 5  . nitroGLYCERIN (NITROSTAT) 0.4 MG SL tablet PLACE 1 TABLET UNDER THE TONGUE Q 5 MINUTES PRF CHEST PAIN  1  . valACYclovir (VALTREX) 500 MG tablet Take 1 tablet (500 mg total) by mouth daily. 30 tablet 1   No current facility-administered medications on file prior to visit.     Past Medical History:  Diagnosis Date  . Acute MI ([place])  x3 - by report only. She has had 3 negative Myoview stress test with no evidence of prior infarct.  . Allergic rhinitis   . Allergy   . Anemia   . Ankle fracture 05/2016  . Anxiety   . Arthritis   . Asthma   . Barrett esophagus 2007  . Chest pain 05-02-2009   echo  EF 55%  . COPD (chronic obstructive pulmonary disease) with chronic bronchitis (HCC)    & Emphysema  . Depression   . Duodenitis without mention of hemorrhage 2007  . Esophageal reflux 2007  . Esophageal stricture   . Full  dentures   . H/O hiatal hernia   . Hiatal hernia 0981,[id number]  . Hyperlipidemia   . Multiple fractures    from falls, fx rt. elbow, fx left wrist, bilateral ankles  . Pneumonia 10/2016  . Ulcer     Past Surgical History:  Procedure Laterality Date  . ABDOMINAL HYSTERECTOMY     BSO  . APPENDECTOMY    . CESAREAN SECTION     x 2  . CHOLECYSTECTOMY    . CHONDROPLASTY Left 01/06/2015   Procedure: CHONDROPLASTY;  Surgeon: Marybelle Killings, MD;  Location: [facility];  Service: Orthopedics;  Laterality: Left;  . COLONOSCOPY    . ELBOW FRACTURE SURGERY     right  . KNEE ARTHROSCOPY WITH EXCISION PLICA Left 7/82/9562   Procedure: KNEE ARTHROSCOPY WITH EXCISION PLICA;  Surgeon: Marybelle Killings, MD;  Location: [facility];  Service: Orthopedics;  Laterality: Left;  . Lower Extremity Arterial Dopplers  07/06/2013   RABI 1.0, LABI 1.1.; No evidence of significant vascular atherogenic plaque  . NECK SURGERY    . NM MYOVIEW LTD  09/2014   LOW RISK. Normal EF of 65% with no regional wall motion abnormalities. No ischemia or infarction.  . TONSILLECTOMY    . WRIST FRACTURE SURGERY     left, has plate    Family History  Problem Relation Age of Onset  . Emphysema Father   . Dementia Mother   . Diabetes Maternal Grandmother   . Diabetes Maternal Aunt   . Diabetes Unknown        mat. cousin  . Heart disease Brother   . Colon cancer Neg Hx   . Allergic rhinitis Neg Hx   . Angioedema Neg Hx   . Asthma Neg Hx   . Atopy Neg Hx   . Eczema Neg Hx   . Immunodeficiency Neg Hx   . Urticaria Neg Hx     Social History   Social History  . Marital status: Married    Spouse name: [name]  . Number of children: 2  . Years of education: HS   Occupational History  . disable    Social History Main Topics  . Smoking status: Current Every Day Smoker    Packs/day: 1.00    Years: 39.00    Types: Cigarettes    Start date: 08/06/1977  . Smokeless tobacco: Never Used      Comment: Currently smoking 1 ppd.  Quit once for 4 months.   . Alcohol use No  . Drug use: No  . Sexual activity: No   Other Topics Concern  . Not on file   Social History Narrative   Patient lives at home spouse - [name].  Has 2 daughters (98 & 44).   Currently "disabled".   Caffeine Use: tea 4 glasses daily.    Smokes ~1 1/2 PPD  05/15/15   Walks everyday - no set routine.      [facility] (12/03/16):   Originally from [facility]. 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 Currently has a dog. No bird exposure. No known mold exposure.       Objective:   Physical Exam BP 136/76 (BP Location: Left Arm, Cuff Size: Normal)   Pulse 79   Ht 5\' 4"  (1.626 m)   Wt 137 lb 6 oz (62.3 kg)   SpO2 91%   BMI 23.58 kg/m   General:  Awake. Alert. No acute distress.   Integument:  Warm & dry. No rash on exposed skin.  Extremities:  No cyanosis or clubbing.  HEENT:  Moist mucus membranes. No oral ulcers. No scleral icterus. Cardiovascular:  Regular rate. No edema. No appreciable JVD.  Pulmonary:  Symmetrically decreased breath sounds. Normal work of breathing on room air. No sustained wheezing. Abdomen: Soft. Normal bowel sounds. Nondistended.  Musculoskeletal:  Normal bulk and tone. No joint deformity or effusion appreciated. Neurological:  Cranial nerves 2-12 grossly in tact. No meningismus. Moving all 4 extremities equally.   PFT 05/16/17: FVC 1.86 L (62%) FEV1 1.42 L (58%) FEV1/FVC 0.76 FEF 25-75 1.17 L (48%) positive bronchodilator response 01/31/17: FVC 1.96 L (59%) FEV1 1.31 L (51%) FEV1/FVC 0.67 FEF 25-75 0.76 L (32%) negative bronchodilator response 09/26/16: FVC 2.37 L (86%) FEV1 1.73 L (77%) FEV1/FVC 0.72 FEF 25-75 1.27 L (54%) 06/19/16: FVC 1.77 L (61%) FEV1 1.26 L (54%) FEV1/FVC 0.71 FEF 25-75 1.01 L (41%) negative bronchodilator response  6MWT 01/31/17:  Walked 340 meters / Baseline Sat 96% on RA / Nadir Sat 95% on RA  IMAGING CT CHEST W/O 02/16/17  (previously reviewed by me):  No fracture. No opacity or nodule. No pleural effusion or thickening. No pathologic mediastinal adenopathy. No pericardial effusion. She has some dependent atelectasis.   CT CORONARY ANGIOGRAPHY 12/02/16 (previously reviewed by me): Per the radiologist patient's coronary calcium score is 0. Right dominant with normal coronary origin. No evidence of coronary artery disease. Reviewing the lung parenchyma shows some mild dependent atelectasis but no evidence of pleural thickening or pleural effusion on limited views. No pathologically enlarged mediastinal lymph nodes on limited views either. Apical predominant emphysematous changes are present.  CXR PA/LAT 11/23/16 (previously reviewed by me): No parenchymal mass or opacity appreciated. No pleural effusion. Heart normal in size & mediastinum normal in contour.  LABS 12/03/16 Alpha-1 antitrypsin: MM (173)  09/26/16 IgG: 524 IgA: 88 IgM: 72    Assessment & Plan:  61 y.o. female with mixed type COPD with emphysema. Reflux seems to be the driving force behind the majority of the patient's symptoms. I educated her on effect of abrupt discontinuation of proton pump inhibitors. Also counseled the patient on the risks of abrupt discontinuation of most of her medications. She does have an ongoing cough which could be due to vocal cord irritation with her reflux but given the severity this warrants evaluation for possible aspiration pneumonia. We discussed her ongoing tobacco use at length. She has been intolerant of previous inhalers feeling that "powder" made her nauseated and did not seem to affect her breathing. For now we will continue her current albuterol rescue inhaler. Reviewing her spirometry from August does show continued stability in her FEV1 but she continues to have a significant bronchodilator response likely due to her ongoing tobacco use and reflux.   1. COPD mixed type with emphysema:  Continuing use of pro-air as  needed. Checking  chest x-ray PA/LAT today given cough. 2. Chronic seasonal allergic rhinitis:  Patient to resume Singulair when able. Continuing to follow with allergy/immunology. 3. GERD:  Restarting patient on Zantac 150 mg by mouth twice a day. Patient to resume Dexilant when able. 4. Tobacco use disorder:  Counseled for over 3 minutes on the need for complete tobacco cessation. Recommended nicotine replacement with gum for intermittent cravings. 5. Nausea and vomiting: Checking CMP today. Prescribing limited Zofran as needed for nausea. Patient instructed to rest this with her PCP. 6. Health maintenance: Status post Pneumovax May 2015 & Tdap August 2017. 7. Follow-up: Return to clinic in 2 weeks.  Sonia Baller Ashok Cordia, M.D. [facility] Pager:  [phone] After 3pm or if no response, call [phone] 9:08 AM 06/24/17

## 2017-06-26 ENCOUNTER — Telehealth: Payer: Self-pay | Admitting: Pulmonary Disease

## 2017-06-26 NOTE — Telephone Encounter (Signed)
Notes recorded by Riley Kill, CMA on 06/25/2017 at 2:07 PM EDT Patient is aware. Verbalized understanding. ------  Notes recorded by Javier Glazier, MD on 06/24/2017 at 5:21 PM EDT Please let the patient know there is no evidence of any pneumonia. Thanks.

## 2017-06-30 ENCOUNTER — Ambulatory Visit (INDEPENDENT_AMBULATORY_CARE_PROVIDER_SITE_OTHER): Payer: Medicare Other | Admitting: Family Medicine

## 2017-06-30 ENCOUNTER — Other Ambulatory Visit (INDEPENDENT_AMBULATORY_CARE_PROVIDER_SITE_OTHER): Payer: Medicare Other

## 2017-06-30 ENCOUNTER — Encounter: Payer: Self-pay | Admitting: Internal Medicine

## 2017-06-30 ENCOUNTER — Encounter: Payer: Self-pay | Admitting: Neurology

## 2017-06-30 ENCOUNTER — Encounter: Payer: Self-pay | Admitting: Family Medicine

## 2017-06-30 ENCOUNTER — Ambulatory Visit (INDEPENDENT_AMBULATORY_CARE_PROVIDER_SITE_OTHER): Payer: Medicare Other | Admitting: Internal Medicine

## 2017-06-30 VITALS — BP 120/90 | HR 99 | Ht 64.0 in | Wt 138.0 lb

## 2017-06-30 VITALS — BP 146/90 | HR 94 | Temp 97.8°F | Ht 64.0 in | Wt 138.0 lb

## 2017-06-30 DIAGNOSIS — M79604 Pain in right leg: Secondary | ICD-10-CM

## 2017-06-30 DIAGNOSIS — M79605 Pain in left leg: Secondary | ICD-10-CM

## 2017-06-30 DIAGNOSIS — R1013 Epigastric pain: Secondary | ICD-10-CM

## 2017-06-30 DIAGNOSIS — R42 Dizziness and giddiness: Secondary | ICD-10-CM

## 2017-06-30 DIAGNOSIS — Z0001 Encounter for general adult medical examination with abnormal findings: Secondary | ICD-10-CM | POA: Diagnosis not present

## 2017-06-30 DIAGNOSIS — J984 Other disorders of lung: Secondary | ICD-10-CM | POA: Diagnosis not present

## 2017-06-30 DIAGNOSIS — R252 Cramp and spasm: Secondary | ICD-10-CM

## 2017-06-30 DIAGNOSIS — Z0000 Encounter for general adult medical examination without abnormal findings: Secondary | ICD-10-CM

## 2017-06-30 DIAGNOSIS — E785 Hyperlipidemia, unspecified: Secondary | ICD-10-CM

## 2017-06-30 HISTORY — DX: Other disorders of lung: J98.4

## 2017-06-30 LAB — CBC WITH DIFFERENTIAL/PLATELET
BASOS ABS: 0.1 10*3/uL (ref 0.0–0.1)
Basophils Relative: 1 % (ref 0.0–3.0)
EOS PCT: 1.6 % (ref 0.0–5.0)
Eosinophils Absolute: 0.1 10*3/uL (ref 0.0–0.7)
HCT: 39.6 % (ref 36.0–46.0)
HEMOGLOBIN: 13 g/dL (ref 12.0–15.0)
LYMPHS ABS: 2.4 10*3/uL (ref 0.7–4.0)
LYMPHS PCT: 33.1 % (ref 12.0–46.0)
MCHC: 32.8 g/dL (ref 30.0–36.0)
MCV: 80.5 fl (ref 78.0–100.0)
MONOS PCT: 5.6 % (ref 3.0–12.0)
Monocytes Absolute: 0.4 10*3/uL (ref 0.1–1.0)
NEUTROS PCT: 58.7 % (ref 43.0–77.0)
Neutro Abs: 4.3 10*3/uL (ref 1.4–7.7)
Platelets: 305 10*3/uL (ref 150.0–400.0)
RBC: 4.92 Mil/uL (ref 3.87–5.11)
RDW: 16.4 % — ABNORMAL HIGH (ref 11.5–15.5)
WBC: 7.3 10*3/uL (ref 4.0–10.5)

## 2017-06-30 LAB — C-REACTIVE PROTEIN: CRP: 0.5 mg/dL (ref 0.5–20.0)

## 2017-06-30 LAB — IBC PANEL
Iron: 82 ug/dL (ref 42–145)
SATURATION RATIOS: 19.8 % — AB (ref 20.0–50.0)
TRANSFERRIN: 296 mg/dL (ref 212.0–360.0)

## 2017-06-30 LAB — SEDIMENTATION RATE: Sed Rate: 18 mm/h (ref 0–30)

## 2017-06-30 MED ORDER — ASPIRIN 81 MG PO TBEC: 81.0000 mg | DELAYED_RELEASE_TABLET | Freq: Every day | ORAL | 12 refills | Status: DC

## 2017-06-30 MED ORDER — PANTOPRAZOLE SODIUM 40 MG PO TBEC: 40.0000 mg | DELAYED_RELEASE_TABLET | Freq: Every day | ORAL | 3 refills | Status: DC

## 2017-06-30 MED ORDER — ROSUVASTATIN CALCIUM 20 MG PO TABS: 20.0000 mg | ORAL_TABLET | Freq: Every day | ORAL | 3 refills | Status: DC

## 2017-06-30 MED ORDER — GABAPENTIN 100 MG PO CAPS: 200.0000 mg | ORAL_CAPSULE | Freq: Every day | ORAL | 3 refills | Status: DC

## 2017-06-30 MED ORDER — KETOROLAC TROMETHAMINE 60 MG/2ML IM SOLN
60.0000 mg | Freq: Once | INTRAMUSCULAR | Status: AC
  Administered 2017-06-30: 60 mg via INTRAMUSCULAR

## 2017-06-30 MED ORDER — MECLIZINE HCL 12.5 MG PO TABS: 12.5000 mg | ORAL_TABLET | Freq: Three times a day (TID) | ORAL | 1 refills | Status: DC | PRN

## 2017-06-30 MED ORDER — METHYLPREDNISOLONE ACETATE 80 MG/ML IJ SUSP
80.0000 mg | Freq: Once | INTRAMUSCULAR | Status: AC
  Administered 2017-06-30: 80 mg via INTRAMUSCULAR

## 2017-06-30 NOTE — Assessment & Plan Note (Addendum)
Likely gastritis related, for change zantac to protonis 40 qd  In addition to the time spent performing CPE, I spent an additional 15 minutes face to face,in which greater than 50% of this time was spent in counseling and coordination of care for patient's illness as documented, including the differential dx, tx, further evaluation and other management of epigastric pain, HLD, vertigo and restrictive lung dz

## 2017-06-30 NOTE — Progress Notes (Signed)
Subjective:    Patient ID: Lori [name], female    DOB: 18-Mar-1956, 61 y.o.   MRN: [id number]  HPI  Here for wellness and f/u;  Overall doing ok;  Pt denies Chest pain, worsening SOB, DOE, wheezing, orthopnea, PND, worsening LE edema, palpitations, dizziness or syncope.  Pt denies neurological change such as new headache, facial or extremity weakness.  Pt denies polydipsia, polyuria, or low sugar symptoms. Pt states overall good compliance with treatment and medications, good tolerability, and has been trying to follow appropriate diet.  Pt denies worsening depressive symptoms, suicidal ideation or panic. No fever, night sweats, wt loss, loss of appetite, or other constitutional symptoms.  Pt states good ability with ADL's, has low fall risk, home safety reviewed and adequate, no other significant changes in hearing or vision, and not active with exercise. Also with c/o incidental 2-3 hours of abd discomfort, crampy, severe, unusual for her, no radiation, no n/v, has hx of "ulcers", none per last EGD sept 2017 with GERD related esophagitis and gastritis.  Nov 2017 colonoscopy with polyps  Denies worsening dysphagia, bowel change or blood. Does have intermittent reflux, takes ranitidine bid but not the desilant as prescribed last month. Not taking the dexilant.  No falls since ? siezure last yr, c/o vertigo and asks for meclizine which has helped in past.  Pt states has been trying to follow lower chol diet Past Medical History:  Diagnosis Date  . Acute MI ([place])    x3 - by report only. She has had 3 negative Myoview stress test with no evidence of prior infarct.  . Allergic rhinitis   . Allergy   . Anemia   . Ankle fracture 05/2016  . Anxiety   . Arthritis   . Asthma   . Barrett esophagus 2007  . Chest pain 05-02-2009   echo  EF 55%  . COPD (chronic obstructive pulmonary disease) with chronic bronchitis (HCC)    & Emphysema  . Depression   . Duodenitis without mention of hemorrhage 2007    . Esophageal reflux 2007  . Esophageal stricture   . Full dentures   . H/O hiatal hernia   . Hiatal hernia [id number]  . Hyperlipidemia   . Multiple fractures    from falls, fx rt. elbow, fx left wrist, bilateral ankles  . Pneumonia 10/2016  . Restrictive lung disease 06/30/2017  . Ulcer    Past Surgical History:  Procedure Laterality Date  . ABDOMINAL HYSTERECTOMY     BSO  . APPENDECTOMY    . CESAREAN SECTION     x 2  . CHOLECYSTECTOMY    . CHONDROPLASTY Left 01/06/2015   Procedure: CHONDROPLASTY;  Surgeon: Marybelle Killings, MD;  Location: [facility];  Service: Orthopedics;  Laterality: Left;  . COLONOSCOPY    . ELBOW FRACTURE SURGERY     right  . KNEE ARTHROSCOPY WITH EXCISION PLICA Left 11/21/6576   Procedure: KNEE ARTHROSCOPY WITH EXCISION PLICA;  Surgeon: Marybelle Killings, MD;  Location: [facility];  Service: Orthopedics;  Laterality: Left;  . Lower Extremity Arterial Dopplers  07/06/2013   RABI 1.0, LABI 1.1.; No evidence of significant vascular atherogenic plaque  . NECK SURGERY    . NM MYOVIEW LTD  09/2014   LOW RISK. Normal EF of 65% with no regional wall motion abnormalities. No ischemia or infarction.  . TONSILLECTOMY    . WRIST FRACTURE SURGERY     left, has plate    reports  that she has been smoking Cigarettes.  She started smoking about 39 years ago. She has a 39.00 pack-year smoking history. She has never used smokeless tobacco. She reports that she does not drink alcohol or use drugs. family history includes Dementia in her mother; Diabetes in her maternal aunt, maternal grandmother, and unknown relative; Emphysema in her father; Heart disease in her brother. Allergies  Allergen Reactions  . Bee Venom Swelling  . Codeine Nausea Only    CAUSES ULCERS  . Ibuprofen Nausea And Vomiting  . Levofloxacin Nausea Only  . Propoxyphene N-Acetaminophen Nausea Only  . Toradol [Ketorolac Tromethamine] Nausea And Vomiting  . Ultram [Tramadol Hcl]    . Hydrocodone-Acetaminophen Nausea Only    REACTION: "sick"  . Latex Rash   Current Outpatient Prescriptions on File Prior to Visit  Medication Sig Dispense Refill  . montelukast (SINGULAIR) 10 MG tablet Take 1 tablet (10 mg total) by mouth at bedtime. 30 tablet 5   No current facility-administered medications on file prior to visit.    Review of Systems Constitutional: Negative for other unusual diaphoresis, sweats, appetite or weight changes HENT: Negative for other worsening hearing loss, ear pain, facial swelling, mouth sores or neck stiffness.   Eyes: Negative for other worsening pain, redness or other visual disturbance.  Respiratory: Negative for other stridor or swelling Cardiovascular: Negative for other palpitations or other chest pain  Gastrointestinal: Negative for worsening diarrhea or loose stools, blood in stool, distention or other pain Genitourinary: Negative for hematuria, flank pain or other change in urine volume.  Musculoskeletal: Negative for myalgias or other joint swelling.  Skin: Negative for other color change, or other wound or worsening drainage.  Neurological: Negative for other syncope or numbness. Hematological: Negative for other adenopathy or swelling Psychiatric/Behavioral: Negative for hallucinations, other worsening agitation, SI, self-injury, or new decreased concentration All other system neg per pt    Objective:   Physical Exam BP (!) 146/90   Pulse 94   Temp 97.8 F (36.6 C) (Oral)   Ht 5\' 4"  (1.626 m)   Wt 138 lb (62.6 kg)   SpO2 98%   BMI 23.69 kg/m  VS noted,  Constitutional: Pt is oriented to person, place, and time. Appears well-developed and well-nourished, in no significant distress and comfortable Head: Normocephalic and atraumatic  Eyes: Conjunctivae and EOM are normal. Pupils are equal, round, and reactive to light Right Ear: External ear normal without discharge Left Ear: External ear normal without discharge Nose: Nose  without discharge or deformity Mouth/Throat: Oropharynx is without other ulcerations and moist  Neck: Normal range of motion. Neck supple. No JVD present. No tracheal deviation present or significant neck LA or mass Cardiovascular: Normal rate, regular rhythm, normal heart sounds and intact distal pulses.   Pulmonary/Chest: WOB normal and breath sounds without rales or wheezing  Abdominal: Soft. Bowel sounds are normal.. No HSM but mild epigastric tender  Musculoskeletal: Normal range of motion. Exhibits no edema Lymphadenopathy: Has no other cervical adenopathy.  Neurological: Pt is alert and oriented to person, place, and time. Pt has normal reflexes. No cranial nerve deficit. Motor grossly intact, Gait intact Skin: Skin is warm and dry. No rash noted or new ulcerations Psychiatric:  Has normal mood and affect. Behavior is normal without agitation No other exam findings Lab Results  Component Value Date   WBC 7.3 06/30/2017   HGB 13.0 06/30/2017   HCT 39.6 06/30/2017   PLT 305.0 06/30/2017   GLUCOSE 97 06/24/2017   CHOL 316 (  H) 03/12/2017   TRIG 525 (H) 03/12/2017   HDL 40 03/12/2017   LDLDIRECT 187.0 10/21/2016   LDLCALC Comment 03/12/2017   ALT 11 06/24/2017   AST 12 06/24/2017   NA 136 06/24/2017   K 3.9 06/24/2017   CL 106 06/24/2017   CREATININE 0.93 06/24/2017   BUN 7 06/24/2017   CO2 23 06/24/2017   TSH 1.74 06/21/2016   INR 0.9 08/31/2008   HGBA1C 5.5 06/21/2016         Assessment & Plan:

## 2017-06-30 NOTE — Assessment & Plan Note (Signed)

## 2017-06-30 NOTE — Assessment & Plan Note (Signed)
Recent spirometry d/w pt including the benign nature and tx options, , to f/u any worsening symptoms or concerns

## 2017-06-30 NOTE — Patient Instructions (Addendum)
OK to stop the zantac (ranitidine)  Please take all new medication as prescribed - the protonix, and the Crestor for high cholesterol  Please also take OTC Aspirin 81 mg - 1 per day to reduce the future heart and stroke risk  Please continue all other medications as before, and refills have been done if requested - the meclizine  Please have the pharmacy call with any other refills you may need.  Please continue your efforts at being more active, low cholesterol diet, and weight control.  You are otherwise up to date with prevention measures today.  Please keep your appointments with your specialists as you may have planned  Please return in 6 months, or sooner if needed, with Lab testing done 3-5 days before

## 2017-06-30 NOTE — Patient Instructions (Addendum)
Good to see you  I am sorry we have not figured it out yet.  Lets get labs today  1 injection today to help with the pain.  Gabapentin 200mg  at night will help if this is nerve pain  We will get the other test as well to see if the nerves are firing right See me again in 3 weeks.

## 2017-06-30 NOTE — Assessment & Plan Note (Signed)
Mild to mod, for meclizine prn, to f/u any worsening symptoms or concerns 

## 2017-06-30 NOTE — Progress Notes (Signed)
Pre visit review using our clinic review tool, if applicable. No additional management support is needed unless otherwise documented below in the visit note. 

## 2017-06-30 NOTE — Assessment & Plan Note (Signed)
Quite severe, d/w pt - for lower chol diet, start crestor 20 qd

## 2017-06-30 NOTE — Progress Notes (Signed)
[name] [address] [place], [place] [address] Phone: [phone] Subjective:      CC: Leg cramping follow-up  YWV:[id number]  Lori [name] is a 61 y.o. female coming in with complaint of leg cramping follow-up. Patient was seen previously and there was a concern for more of a iron deficiency anemia. Patient states that she's been taking the over-the-counter medications intermittently. Was taking some of the perception medications but very minimal. Patient states that she has made very minimal improvement. States that the cramping seems to be worse with activity as well as at night. Patient has not notice any other significant causes her exacerbations at this time.      Past Medical History:  Diagnosis Date  . Acute MI ([facility])    x3 - by report only. She has had 3 negative Myoview stress test with no evidence of prior infarct.  . Allergic rhinitis   . Allergy   . Anemia   . Ankle fracture 05/2016  . Anxiety   . Arthritis   . Asthma   . Barrett esophagus 2007  . Chest pain 05-02-2009   echo  EF 55%  . COPD (chronic obstructive pulmonary disease) with chronic bronchitis (HCC)    & Emphysema  . Depression   . Duodenitis without mention of hemorrhage 2007  . Esophageal reflux 2007  . Esophageal stricture   . Full dentures   . H/O hiatal hernia   . Hiatal hernia [id number]  . Hyperlipidemia   . Multiple fractures    from falls, fx rt. elbow, fx left wrist, bilateral ankles  . Pneumonia 10/2016  . Ulcer    Past Surgical History:  Procedure Laterality Date  . ABDOMINAL HYSTERECTOMY     BSO  . APPENDECTOMY    . CESAREAN SECTION     x 2  . CHOLECYSTECTOMY    . CHONDROPLASTY Left 01/06/2015   Procedure: CHONDROPLASTY;  Surgeon: Marybelle Killings, MD;  Location: [facility];  Service: Orthopedics;  Laterality: Left;  . COLONOSCOPY    . ELBOW FRACTURE SURGERY     right  . KNEE ARTHROSCOPY WITH EXCISION PLICA Left 0/05/3817   Procedure: KNEE ARTHROSCOPY WITH EXCISION PLICA;  Surgeon: Marybelle Killings, MD;  Location: [facility];  Service: Orthopedics;  Laterality: Left;  . Lower Extremity Arterial Dopplers  07/06/2013   RABI 1.0, LABI 1.1.; No evidence of significant vascular atherogenic plaque  . NECK SURGERY    . NM MYOVIEW LTD  09/2014   LOW RISK. Normal EF of 65% with no regional wall motion abnormalities. No ischemia or infarction.  . TONSILLECTOMY    . WRIST FRACTURE SURGERY     left, has plate   Social History   Social History  . Marital status: Married    Spouse name: Lori [name]  . Number of children: 2  . Years of education: HS   Occupational History  . disable    Social History Main Topics  . Smoking status: Current Every Day Smoker    Packs/day: 1.00    Years: 39.00    Types: Cigarettes    Start date: 08/06/1977  . Smokeless tobacco: Never Used     Comment: Currently smoking 3 cigs per day.  Quit once for 4 months.   . Alcohol use No  . Drug use: No  . Sexual activity: No   Other Topics Concern  . Not on file   Social History Narrative   Patient  lives at home spouse - Lori [name].  Has 2 daughters (32 & 45).   Currently "disabled".   Caffeine Use: tea 4 glasses daily.    Smokes ~1 1/2 PPD  05/15/15   Walks everyday - no set routine.      [facility] (12/03/16):   Originally from [facility]. Previously worked doing housekeeping for [employer] Currently has a dog. No bird exposure. No known mold exposure.    Allergies  Allergen Reactions  . Bee Venom Swelling  . Codeine Nausea Only    CAUSES ULCERS  . Ibuprofen Nausea And Vomiting  . Levofloxacin Nausea Only  . Propoxyphene N-Acetaminophen Nausea Only  . Toradol [Ketorolac Tromethamine] Nausea And Vomiting  . Ultram [Tramadol Hcl]   . Hydrocodone-Acetaminophen Nausea Only    REACTION: "sick"  . Latex Rash   Family History  Problem Relation Age of Onset  . Emphysema Father   . Dementia Mother   . Diabetes  Maternal Grandmother   . Diabetes Maternal Aunt   . Diabetes Unknown        mat. cousin  . Heart disease Brother   . Colon cancer Neg Hx   . Allergic rhinitis Neg Hx   . Angioedema Neg Hx   . Asthma Neg Hx   . Atopy Neg Hx   . Eczema Neg Hx   . Immunodeficiency Neg Hx   . Urticaria Neg Hx      Past medical history, social, surgical and family history all reviewed in electronic medical record.  No pertanent information unless stated regarding to the chief complaint.   Review of Systems:Review of systems updated and as accurate as of 06/30/17  No visual changes, nausea, vomiting, diarrhea, constipation, dizziness, abdominal pain, skin rash, fevers, chills, night sweats, weight loss, swollen lymph nodes, , joint swelling,  chest pain, shortness of breath, mood changes. Positive muscle aches, body aches, headaches  Objective  There were no vitals taken for this visit. Systems examined below as of 06/30/17   General: No apparent distress alert and oriented x3 mood and affect normal, dressed appropriately.  HEENT: Pupils equal, extraocular movements intact  Respiratory: Patient's speak in full sentences and does not appear short of breath  Cardiovascular: No lower extremity edema, non tender, no erythema  Skin: Warm dry intact with no signs of infection or rash on extremities or on axial skeleton.  Abdomen: Soft nontender  Neuro: Cranial nerves II through XII are intact, neurovascularly intact in all extremities with 2+ DTRs and 2+ pulses.  Lymph: No lymphadenopathy of posterior or anterior cervical chain or axillae bilaterally.  Gait Antalgic MSK:  Moderate to severe tender with full range of motion and good stability and symmetric strength and tone of shoulders, elbows, wrist, hip, knee and ankles bilaterally. Severe arthritic changes of multiple joints patient does have some mild atrophy of the leg muscles bilaterally. Tenderness in the cavus bilaterally more than amount of  palpation.     Impression and Recommendations:     This case required medical decision making of moderate complexity.      Note: This dictation was prepared with Dragon dictation along with smaller phrase technology. Any transcriptional errors that result from this process are unintentional.

## 2017-06-30 NOTE — Progress Notes (Addendum)
Subjective:   Lori [name] is a 61 y.o. female who presents for an Initial Medicare Annual Wellness Visit.  Review of Systems    No ROS.  Medicare Wellness Visit. Additional risk factors are reflected in the social history.   Cardiac Risk Factors include: advanced age (>72men, >59 women);dyslipidemia;smoking/ tobacco exposure Sleep patterns: gets up 1-2 times nightly to void and sleeps 4-5 hours nightly. Patient reports insomnia issues, discussed recommended sleep tips and stress reduction tips.   Home Safety/Smoke Alarms: Feels safe in home. Smoke alarms in place.  Living environment; residence and Firearm Safety: 1-story house/ trailer, no firearms. Lives with husband no needs for DME, good support system Seat Belt Safety/Bike Helmet: Wears seat belt.     Objective:    Today's Vitals   07/02/17 0834 07/02/17 0836  BP: 116/60   Pulse: 82   Resp: 20   SpO2: 99%   Weight: 139 lb (63 kg)   Height: 5\' 4"  (1.626 m)   PainSc:  3    Body mass index is 23.86 kg/m.   Current Medications (verified) Outpatient Encounter Prescriptions as of 07/02/2017  Medication Sig  . aspirin 81 MG EC tablet Take 1 tablet (81 mg total) by mouth daily. Swallow whole.  . meclizine (ANTIVERT) 12.5 MG tablet Take 1 tablet (12.5 mg total) by mouth 3 (three) times daily as needed for dizziness.  . montelukast (SINGULAIR) 10 MG tablet Take 1 tablet (10 mg total) by mouth at bedtime.  . pantoprazole (PROTONIX) 40 MG tablet Take 1 tablet (40 mg total) by mouth daily.  . rosuvastatin (CRESTOR) 20 MG tablet Take 1 tablet (20 mg total) by mouth daily.  . valACYclovir (VALTREX) 500 MG tablet   . [DISCONTINUED] albuterol (PROVENTIL HFA;VENTOLIN HFA) 108 (90 Base) MCG/ACT inhaler Inhale 2-4 puffs as needed for chest tightness, wheezing or cough.  . [DISCONTINUED] benzonatate (TESSALON) 100 MG capsule Take 1 capsule (100 mg total) by mouth 3 (three) times daily as needed for cough.  . [DISCONTINUED]  dexlansoprazole (DEXILANT) 60 MG capsule Take 1 capsule (60 mg total) by mouth daily.  . [DISCONTINUED] dextromethorphan-guaiFENesin (MUCINEX DM) 30-600 MG 12hr tablet Take 1 tablet by mouth 2 (two) times daily as needed for cough.  . [DISCONTINUED] EPINEPHrine 0.3 mg/0.3 mL IJ SOAJ injection Use as directed for severe allergic reaction  . [DISCONTINUED] gabapentin (NEURONTIN) 100 MG capsule Take 2 capsules (200 mg total) by mouth at bedtime.  . [DISCONTINUED] nitroGLYCERIN (NITROSTAT) 0.4 MG SL tablet PLACE 1 TABLET UNDER THE TONGUE Q 5 MINUTES PRF CHEST PAIN  . [DISCONTINUED] ondansetron (ZOFRAN) 4 MG tablet Take 1 tablet (4 mg total) by mouth every 6 (six) hours as needed for nausea or vomiting.  . [DISCONTINUED] ranitidine (ZANTAC) 150 MG tablet TAKE 1 TABLET(150 MG) BY MOUTH TWICE DAILY  . [DISCONTINUED] valACYclovir (VALTREX) 500 MG tablet Take 1 tablet (500 mg total) by mouth daily.   No facility-administered encounter medications on file as of 07/02/2017.     Allergies (verified) Bee venom; Codeine; Ibuprofen; Levofloxacin; Propoxyphene n-acetaminophen; Toradol [ketorolac tromethamine]; Ultram [tramadol hcl]; Hydrocodone-acetaminophen; and Latex   History: Past Medical History:  Diagnosis Date  . Acute MI ([facility])    x3 - by report only. She has had 3 negative Myoview stress test with no evidence of prior infarct.  . Allergic rhinitis   . Allergy   . Anemia   . Ankle fracture 05/2016  . Anxiety   . Arthritis   . Asthma   . Barrett  esophagus 2007  . Chest pain 05-02-2009   echo  EF 55%  . COPD (chronic obstructive pulmonary disease) with chronic bronchitis (HCC)    & Emphysema  . Depression   . Duodenitis without mention of hemorrhage 2007  . Esophageal reflux 2007  . Esophageal stricture   . Full dentures   . H/O hiatal hernia   . Hiatal hernia [id number]  . Hyperlipidemia   . Multiple fractures    from falls, fx rt. elbow, fx left wrist, bilateral ankles  . Pneumonia  10/2016  . Restrictive lung disease 06/30/2017  . Ulcer    Past Surgical History:  Procedure Laterality Date  . ABDOMINAL HYSTERECTOMY     BSO  . APPENDECTOMY    . CESAREAN SECTION     x 2  . CHOLECYSTECTOMY    . CHONDROPLASTY Left 01/06/2015   Procedure: CHONDROPLASTY;  Surgeon: Marybelle Killings, MD;  Location: [facility];  Service: Orthopedics;  Laterality: Left;  . COLONOSCOPY    . ELBOW FRACTURE SURGERY     right  . KNEE ARTHROSCOPY WITH EXCISION PLICA Left 4/78/2956   Procedure: KNEE ARTHROSCOPY WITH EXCISION PLICA;  Surgeon: Marybelle Killings, MD;  Location: [facility];  Service: Orthopedics;  Laterality: Left;  . Lower Extremity Arterial Dopplers  07/06/2013   RABI 1.0, LABI 1.1.; No evidence of significant vascular atherogenic plaque  . NECK SURGERY    . NM MYOVIEW LTD  09/2014   LOW RISK. Normal EF of 65% with no regional wall motion abnormalities. No ischemia or infarction.  . TONSILLECTOMY    . WRIST FRACTURE SURGERY     left, has plate   Family History  Problem Relation Age of Onset  . Emphysema Father   . Dementia Mother   . Diabetes Maternal Grandmother   . Diabetes Maternal Aunt   . Diabetes Unknown        mat. cousin  . Heart disease Brother   . Colon cancer Neg Hx   . Allergic rhinitis Neg Hx   . Angioedema Neg Hx   . Asthma Neg Hx   . Atopy Neg Hx   . Eczema Neg Hx   . Immunodeficiency Neg Hx   . Urticaria Neg Hx    Social History   Occupational History  . disable    Social History Main Topics  . Smoking status: Current Every Day Smoker    Packs/day: 1.00    Years: 39.00    Types: Cigarettes    Start date: 08/06/1977  . Smokeless tobacco: Never Used     Comment: Currently smoking 3 cigs per day.  Quit once for 4 months.   . Alcohol use No  . Drug use: No  . Sexual activity: No    Tobacco Counseling Ready to quit: Not Answered Counseling given: Not Answered   Activities of Daily Living In your present state of  health, do you have any difficulty performing the following activities: 07/02/2017 10/26/2016  Hearing? N N  Vision? N N  Difficulty concentrating or making decisions? N N  Walking or climbing stairs? N Y  Dressing or bathing? N N  Doing errands, shopping? N Y  Conservation officer, nature and eating ? N -  Using the Toilet? N -  In the past six months, have you accidently leaked urine? N -  Do you have problems with loss of bowel control? N -  Managing your Medications? N -  Managing your Finances? N -  Housekeeping  or managing your Housekeeping? N -  Some recent data might be hidden    Immunizations and Health Maintenance Immunization History  Administered Date(s) Administered  . Influenza,inj,Quad PF,6+ Mos 12/03/2016  . Influenza-Unspecified 05/18/2017  . Pneumococcal Polysaccharide-23 01/17/2014  . Tdap 09/09/2013, 05/10/2016   There are no preventive care reminders to display for this patient.  Patient Care Team: Biagio Borg, MD as PCP - General (Internal Medicine) Harvie Junior, MD as Referring Physician (Specialist) Harvie Junior, MD as Referring Physician (Specialist) Harvie Junior, MD as Referring Physician (Specialist)  Indicate any recent Medical Services you may have received from other than [facility] providers in the past year (date may be approximate).     Assessment:   This is a routine wellness examination for [place]. Physical assessment deferred to PCP.   Hearing/Vision screen  Visual Acuity Screening   Right eye Left eye Both eyes  Without correction:   20/50  With correction:     Hearing Screening Comments: Able to hear conversational tones w/o difficulty. No issues reported.  Passed whisper test  Dietary issues and exercise activities discussed: Current Exercise Habits: Home exercise routine, Type of exercise: walking, Time (Minutes): 30, Frequency (Times/Week): 5, Weekly Exercise (Minutes/Week): 150, Intensity: Mild, Exercise limited by: orthopedic  condition(s)  Diet (meal preparation, eat out, water intake, caffeinated beverages, dairy products, fruits and vegetables): in general, an "unhealthy" diet   Reviewed heart healthy and bland diet. Discussed food choices for acid reflux, encouraged patient to increase daily water intake. Diet education was attached to patient's AVS.  Goals    . Try to not get upset by other people          Do my very best to control how I react to the situations I cannot control.      Depression Screen PHQ 2/9 Scores 07/02/2017 06/21/2016  PHQ - 2 Score 2 0  PHQ- 9 Score 7 -    Fall Risk Fall Risk  07/02/2017 06/30/2017 06/21/2016 06/21/2015 06/21/2015  Falls in the past year? No Yes Yes (No Data) Yes  Comment - - - last fall ~ 05/22/15 approx 05/22/15  Number falls in past yr: - 1 2 or more - -  Injury with Fall? - No Yes (No Data) -  Comment - - - bruise on left arm -  Risk Factor Category  - - High Fall Risk - -  Risk for fall due to : - - History of fall(s) - -    Cognitive Function: MMSE - Mini Mental State Exam 07/02/2017  Orientation to time 5  Orientation to Place 5  Registration 3  Attention/ Calculation 3  Recall 2  Language- name 2 objects 2  Language- repeat 1  Language- follow 3 step command 3  Language- read & follow direction 1  Write a sentence 1  Copy design 1  Total score 27        Screening Tests Health Maintenance  Topic Date Due  . MAMMOGRAM  06/17/2018  . COLONOSCOPY  08/30/2021  . TETANUS/TDAP  05/10/2026  . INFLUENZA VACCINE  Completed  . Hepatitis C Screening  Completed  . HIV Screening  Completed      Plan:    Continue doing brain stimulating activities (puzzles, reading, adult coloring books, staying active) to keep memory sharp.    I have personally reviewed and noted the following in the patient's chart:   . Medical and social history . Use of alcohol, tobacco or illicit drugs  .  Current medications and supplements . Functional ability and  status . Nutritional status . Physical activity . Advanced directives . List of other physicians . Vitals . Screenings to include cognitive, depression, and falls . Referrals and appointments  In addition, I have reviewed and discussed with patient certain preventive protocols, quality metrics, and best practice recommendations. A written personalized care plan for preventive services as well as general preventive health recommendations were provided to patient.     Michiel Cowboy, RN   07/02/2017   Medical screening examination/treatment/procedure(s) were performed by non-physician practitioner and as supervising physician I was immediately available for consultation/collaboration. I agree with above. Cathlean Cower, MD

## 2017-06-30 NOTE — Assessment & Plan Note (Signed)
Still believe likely this is more secondary to a systemic imbalance. Patient is also a chronic smoker. We discussed these things could contribute. We discussed diet changes. Laboratory workup ordered. Differential includes a lumbar stenosis. We will monitor closely. Patient will get labs today and we'll consider possibly x-rays at follow-up if no improvement. Gabapentin given. Follow-up again in 4 weeks injections given today for pain relief

## 2017-07-01 ENCOUNTER — Other Ambulatory Visit: Payer: Self-pay | Admitting: *Deleted

## 2017-07-01 DIAGNOSIS — M79604 Pain in right leg: Secondary | ICD-10-CM

## 2017-07-01 DIAGNOSIS — M79605 Pain in left leg: Principal | ICD-10-CM

## 2017-07-02 ENCOUNTER — Ambulatory Visit (INDEPENDENT_AMBULATORY_CARE_PROVIDER_SITE_OTHER): Payer: Medicare Other | Admitting: *Deleted

## 2017-07-02 VITALS — BP 116/60 | HR 82 | Resp 20 | Ht 64.0 in | Wt 139.0 lb

## 2017-07-02 DIAGNOSIS — Z0000 Encounter for general adult medical examination without abnormal findings: Secondary | ICD-10-CM

## 2017-07-02 NOTE — Patient Instructions (Addendum)
Continue doing brain stimulating activities (puzzles, reading, adult coloring books, staying active) to keep memory sharp.   [name] , Thank you for taking time to come for your Medicare Wellness Visit. I appreciate your ongoing commitment to your health goals. Please review the following plan we discussed and let me know if I can assist you in the future.   These are the goals we discussed: Goals    . Try to not get upset by other people          Do my very best to control how I react to the situations I cannot control.       This is a list of the screening recommended for you and due dates:  Health Maintenance  Topic Date Due  . Mammogram  06/17/2018  . Colon Cancer Screening  08/30/2021  . Tetanus Vaccine  05/10/2026  . Flu Shot  Completed  .  Hepatitis C: One time screening is recommended by Center for Disease Control  (CDC) for  adults born from [birth year] through 1965.   Completed  . HIV Screening  Completed    Bland Diet A bland diet consists of foods that do not have a lot of fat or fiber. Foods without fat or fiber are easier for the body to digest. They are also less likely to irritate your mouth, throat, stomach, and other parts of your gastrointestinal tract. A bland diet is sometimes called a BRAT diet. What is my plan? Your health care provider or dietitian may recommend specific changes to your diet to prevent and treat your symptoms, such as:  Eating small meals often.  Cooking food until it is soft enough to chew easily.  Chewing your food well.  Drinking fluids slowly.  Not eating foods that are very spicy, sour, or fatty.  Not eating citrus fruits, such as oranges and grapefruit.  What do I need to know about this diet?  Eat a variety of foods from the bland diet food list.  Do not follow a bland diet longer than you have to.  Ask your health care provider whether you should take vitamins. What foods can I eat? Grains  Hot cereals, such as cream of  wheat. Bread, crackers, or tortillas made from refined white flour. Rice. Vegetables Canned or cooked vegetables. Mashed or boiled potatoes. Fruits Bananas. Applesauce. Other types of cooked or canned fruit with the skin and seeds removed, such as canned peaches or pears. Meats and Other Protein Sources Scrambled eggs. Creamy peanut butter or other nut butters. Lean, well-cooked meats, such as chicken or fish. Tofu. Soups or broths. Dairy Low-fat dairy products, such as milk, cottage cheese, or yogurt. Beverages Water. Herbal tea. Apple juice. Sweets and Desserts Pudding. Custard. Fruit gelatin. Ice cream. Fats and Oils Mild salad dressings. Canola or olive oil. The items listed above may not be a complete list of allowed foods or beverages. Contact your dietitian for more options. What foods are not recommended? Foods and ingredients that are often not recommended include:  Spicy foods, such as hot sauce or salsa.  Fried foods.  Sour foods, such as pickled or fermented foods.  Raw vegetables or fruits, especially citrus or berries.  Caffeinated drinks.  Alcohol.  Strongly flavored seasonings or condiments.  The items listed above may not be a complete list of foods and beverages that are not allowed. Contact your dietitian for more information. This information is not intended to replace advice given to you by your health care  provider. Make sure you discuss any questions you have with your health care provider. Document Released: 12/25/2015 Document Revised: 02/08/2016 Document Reviewed: 09/14/2014 Elsevier Interactive Patient Education  2018 [place] Diet A bland diet consists of foods that do not have a lot of fat or fiber. Foods without fat or fiber are easier for the body to digest. They are also less likely to irritate your mouth, throat, stomach, and other parts of your gastrointestinal tract. A bland diet is sometimes called a BRAT diet. What is my  plan? Your health care provider or dietitian may recommend specific changes to your diet to prevent and treat your symptoms, such as:  Eating small meals often.  Cooking food until it is soft enough to chew easily.  Chewing your food well.  Drinking fluids slowly.  Not eating foods that are very spicy, sour, or fatty.  Not eating citrus fruits, such as oranges and grapefruit.  What do I need to know about this diet?  Eat a variety of foods from the bland diet food list.  Do not follow a bland diet longer than you have to.  Ask your health care provider whether you should take vitamins. What foods can I eat? Grains  Hot cereals, such as cream of wheat. Bread, crackers, or tortillas made from refined white flour. Rice. Vegetables Canned or cooked vegetables. Mashed or boiled potatoes. Fruits Bananas. Applesauce. Other types of cooked or canned fruit with the skin and seeds removed, such as canned peaches or pears. Meats and Other Protein Sources Scrambled eggs. Creamy peanut butter or other nut butters. Lean, well-cooked meats, such as chicken or fish. Tofu. Soups or broths. Dairy Low-fat dairy products, such as milk, cottage cheese, or yogurt. Beverages Water. Herbal tea. Apple juice. Sweets and Desserts Pudding. Custard. Fruit gelatin. Ice cream. Fats and Oils Mild salad dressings. Canola or olive oil. The items listed above may not be a complete list of allowed foods or beverages. Contact your dietitian for more options. What foods are not recommended? Foods and ingredients that are often not recommended include:  Spicy foods, such as hot sauce or salsa.  Fried foods.  Sour foods, such as pickled or fermented foods.  Raw vegetables or fruits, especially citrus or berries.  Caffeinated drinks.  Alcohol.  Strongly flavored seasonings or condiments.  The items listed above may not be a complete list of foods and beverages that are not allowed. Contact your  dietitian for more information. This information is not intended to replace advice given to you by your health care provider. Make sure you discuss any questions you have with your health care provider. Document Released: 12/25/2015 Document Revised: 02/08/2016 Document Reviewed: 09/14/2014 Elsevier Interactive Patient Education  2018 [place] for Gastroesophageal Reflux Disease, Adult When you have gastroesophageal reflux disease (GERD), the foods you eat and your eating habits are very important. Choosing the right foods can help ease your discomfort. What guidelines do I need to follow?  Choose fruits, vegetables, whole grains, and low-fat dairy products.  Choose low-fat meat, fish, and poultry.  Limit fats such as oils, salad dressings, butter, nuts, and avocado.  Keep a food diary. This helps you identify foods that cause symptoms.  Avoid foods that cause symptoms. These may be different for everyone.  Eat small meals often instead of 3 large meals a day.  Eat your meals slowly, in a place where you are relaxed.  Limit fried foods.  Cook foods using methods  other than frying.  Avoid drinking alcohol.  Avoid drinking large amounts of liquids with your meals.  Avoid bending over or lying down until 2-3 hours after eating. What foods are not recommended? These are some foods and drinks that may make your symptoms worse: Vegetables Tomatoes. Tomato juice. Tomato and spaghetti sauce. Chili peppers. Onion and garlic. Horseradish. Fruits Oranges, grapefruit, and lemon (fruit and juice). Meats High-fat meats, fish, and poultry. This includes hot dogs, ribs, ham, sausage, salami, and bacon. Dairy Whole milk and chocolate milk. Sour cream. Cream. Butter. Ice cream. Cream cheese. Drinks Coffee and tea. Bubbly (carbonated) drinks or energy drinks. Condiments Hot sauce. Barbecue sauce. Sweets/Desserts Chocolate and cocoa. Donuts. Peppermint and  spearmint. Fats and Oils High-fat foods. This includes [place] fries and potato chips. Other Vinegar. Strong spices. This includes black pepper, white pepper, red pepper, cayenne, curry powder, cloves, ginger, and chili powder. The items listed above may not be a complete list of foods and drinks to avoid. Contact your dietitian for more information. This information is not intended to replace advice given to you by your health care provider. Make sure you discuss any questions you have with your health care provider. Document Released: 03/03/2012 Document Revised: 02/08/2016 Document Reviewed: 07/07/2013 Elsevier Interactive Patient Education  2017 [employer].

## 2017-07-04 LAB — PTH, INTACT AND CALCIUM
Calcium: 9.3 mg/dL (ref 8.6–10.4)
PTH: 78 pg/mL — ABNORMAL HIGH (ref 14–64)

## 2017-07-04 LAB — VITAMIN D 1,25 DIHYDROXY
VITAMIN D 1, 25 (OH) TOTAL: 51 pg/mL (ref 18–72)
VITAMIN D3 1, 25 (OH): 51 pg/mL

## 2017-07-04 LAB — RHEUMATOID FACTOR

## 2017-07-04 LAB — ANA: Anti Nuclear Antibody(ANA): NEGATIVE

## 2017-07-04 LAB — CALCIUM, IONIZED: CALCIUM ION: 5.2 mg/dL (ref 4.8–5.6)

## 2017-07-08 ENCOUNTER — Ambulatory Visit: Payer: Medicare Other | Admitting: Pulmonary Disease

## 2017-07-09 ENCOUNTER — Emergency Department (HOSPITAL_COMMUNITY): Payer: Medicare Other

## 2017-07-09 ENCOUNTER — Encounter (HOSPITAL_COMMUNITY): Payer: Self-pay | Admitting: Emergency Medicine

## 2017-07-09 ENCOUNTER — Emergency Department (HOSPITAL_COMMUNITY)
Admission: EM | Admit: 2017-07-09 | Discharge: 2017-07-09 | Disposition: A | Discharge status: Home / Self Care | Payer: Medicare Other | Attending: Emergency Medicine | Admitting: Emergency Medicine

## 2017-07-09 DIAGNOSIS — R51 Headache: Secondary | ICD-10-CM | POA: Insufficient documentation

## 2017-07-09 DIAGNOSIS — J449 Chronic obstructive pulmonary disease, unspecified: Secondary | ICD-10-CM | POA: Insufficient documentation

## 2017-07-09 DIAGNOSIS — R6884 Jaw pain: Secondary | ICD-10-CM | POA: Diagnosis not present

## 2017-07-09 DIAGNOSIS — R070 Pain in throat: Secondary | ICD-10-CM | POA: Diagnosis not present

## 2017-07-09 DIAGNOSIS — Z79899 Other long term (current) drug therapy: Secondary | ICD-10-CM | POA: Insufficient documentation

## 2017-07-09 DIAGNOSIS — F17210 Nicotine dependence, cigarettes, uncomplicated: Secondary | ICD-10-CM | POA: Insufficient documentation

## 2017-07-09 DIAGNOSIS — R221 Localized swelling, mass and lump, neck: Secondary | ICD-10-CM | POA: Diagnosis not present

## 2017-07-09 DIAGNOSIS — R519 Headache, unspecified: Secondary | ICD-10-CM

## 2017-07-09 LAB — CBC WITH DIFFERENTIAL/PLATELET
BASOS ABS: 0 10*3/uL (ref 0.0–0.1)
Basophils Relative: 0 %
EOS ABS: 0.1 10*3/uL (ref 0.0–0.7)
EOS PCT: 2 %
HCT: 37.9 % (ref 36.0–46.0)
Hemoglobin: 12.3 g/dL (ref 12.0–15.0)
LYMPHS ABS: 2.7 10*3/uL (ref 0.7–4.0)
Lymphocytes Relative: 35 %
MCH: 26.1 pg (ref 26.0–34.0)
MCHC: 32.5 g/dL (ref 30.0–36.0)
MCV: 80.5 fL (ref 78.0–100.0)
MONO ABS: 0.4 10*3/uL (ref 0.1–1.0)
Monocytes Relative: 5 %
Neutro Abs: 4.4 10*3/uL (ref 1.7–7.7)
Neutrophils Relative %: 58 %
PLATELETS: 309 10*3/uL (ref 150–400)
RBC: 4.71 MIL/uL (ref 3.87–5.11)
RDW: 15 % (ref 11.5–15.5)
WBC: 7.6 10*3/uL (ref 4.0–10.5)

## 2017-07-09 LAB — I-STAT CHEM 8, ED
BUN: 11 mg/dL (ref 6–20)
CALCIUM ION: 1.16 mmol/L (ref 1.15–1.40)
CHLORIDE: 104 mmol/L (ref 101–111)
Creatinine, Ser: 1 mg/dL (ref 0.44–1.00)
Glucose, Bld: 92 mg/dL (ref 65–99)
HCT: 39 % (ref 36.0–46.0)
HEMOGLOBIN: 13.3 g/dL (ref 12.0–15.0)
Potassium: 4.6 mmol/L (ref 3.5–5.1)
SODIUM: 137 mmol/L (ref 135–145)
TCO2: 25 mmol/L (ref 22–32)

## 2017-07-09 MED ORDER — FENTANYL CITRATE (PF) 100 MCG/2ML IJ SOLN
50.0000 ug | Freq: Once | INTRAMUSCULAR | Status: AC
  Administered 2017-07-09: 50 ug via INTRAVENOUS
  Filled 2017-07-09: qty 2

## 2017-07-09 MED ORDER — TRAMADOL HCL 50 MG PO TABS: 50.0000 mg | ORAL_TABLET | Freq: Four times a day (QID) | ORAL | 0 refills | Status: DC | PRN

## 2017-07-09 MED ORDER — IOPAMIDOL (ISOVUE-300) INJECTION 61%
75.0000 mL | Freq: Once | INTRAVENOUS | Status: AC | PRN
  Administered 2017-07-09: 75 mL via INTRAVENOUS

## 2017-07-09 MED ORDER — SODIUM CHLORIDE 0.9 % IV BOLUS (SEPSIS)
1000.0000 mL | Freq: Once | INTRAVENOUS | Status: AC
  Administered 2017-07-09: 1000 mL via INTRAVENOUS

## 2017-07-09 MED ORDER — ONDANSETRON HCL 4 MG/2ML IJ SOLN
4.0000 mg | Freq: Once | INTRAMUSCULAR | Status: AC
  Administered 2017-07-09: 4 mg via INTRAVENOUS
  Filled 2017-07-09: qty 2

## 2017-07-09 MED ORDER — IOPAMIDOL (ISOVUE-300) INJECTION 61%
INTRAVENOUS | Status: AC
  Filled 2017-07-09: qty 75

## 2017-07-09 NOTE — ED Notes (Signed)
Bed: WA06 Expected date:  Expected time:  Means of arrival:  Comments: 

## 2017-07-09 NOTE — ED Notes (Signed)
Patient returned from CT

## 2017-07-09 NOTE — ED Notes (Signed)
Pt's father came to RN frustrated at the wait for the CT scan.  RN asked CT Tech about timeframe, and was informed that she would be next within the next 20-30 mins.  Pt and family updated on status

## 2017-07-09 NOTE — ED Notes (Signed)
Patient transported to CT 

## 2017-07-09 NOTE — ED Triage Notes (Addendum)
Pt complaint of facial and neck swelling post starting nicotine transdermal system patch yesterday. Swelling noted at midnight. Pt speaking full sentences but complaint of severe pain with opening mouth.

## 2017-07-09 NOTE — Discharge Instructions (Signed)
You have face and throat pain.  Trigeminal neuralgia could do some of the face pain but should not hurt all the way down in the throat.  Follow-up with your doctors as planned.

## 2017-07-09 NOTE — ED Provider Notes (Signed)
[facility] DEPT Provider Note   CSN: [id number] Arrival date & time: 07/09/17  1117     History   Chief Complaint Chief Complaint  Patient presents with  . Facial Swelling  . Allergic Reaction    HPI Lori [name] is a 61 y.o. female.  HPI Patient presents with left face and jaw pain.  Worse with moving jaw.  States she has been having difficulty eating over the last month.  States that food gets caught in her throat.  States swelling is gotten worse since last night.  No fevers.  States she has to vomit up her food.  She recently started nicotine patch to help stop smoking.  States she has no teeth in her mouth.  No fevers.  States the pain is now on the right side of her jaw also.   Past Medical History:  Diagnosis Date  . Acute MI ([place])    x3 - by report only. She has had 3 negative Myoview stress test with no evidence of prior infarct.  . Allergic rhinitis   . Allergy   . Anemia   . Ankle fracture 05/2016  . Anxiety   . Arthritis   . Asthma   . Barrett esophagus 2007  . Chest pain 05-02-2009   echo  EF 55%  . COPD (chronic obstructive pulmonary disease) with chronic bronchitis ([facility])    & Emphysema  . Depression   . Duodenitis without mention of hemorrhage 2007  . Esophageal reflux 2007  . Esophageal stricture   . Full dentures   . H/O hiatal hernia   . Hiatal hernia 4098,1191  . Hyperlipidemia   . Multiple fractures    from falls, fx rt. elbow, fx left wrist, bilateral ankles  . Pneumonia 10/2016  . Restrictive lung disease 06/30/2017  . Ulcer     Patient Active Problem List   Diagnosis Date Noted  . Encounter for well adult exam with abnormal findings 06/30/2017  . Epigastric pain 06/30/2017  . Restrictive lung disease 06/30/2017  . Leg cramping 06/09/2017  . Elbow contusion 06/09/2017  . Right elbow pain 05/27/2017  . COPD mixed type ([place]) 01/31/2017  . Dizziness 01/20/2017  . Perennial allergic rhinitis  01/01/2017  . Moderate persistent asthma, uncomplicated 47/82/9562  . Pulmonary emphysema ([place]) 12/03/2016  . Tobacco use disorder 12/03/2016  . Chronic seasonal allergic rhinitis 12/03/2016  . Medication management 11/29/2016  . Cough 11/23/2016  . Rash 11/23/2016  . Vertigo 10/22/2016  . Gastroesophageal reflux disease with esophagitis 10/22/2016  . Non compliance w medication regimen 08/01/2016  . Allergic contact dermatitis due to metals 08/01/2016  . Migraine without aura and without status migrainosus, not intractable 07/22/2016  . Spasm of muscle of lower back 06/21/2016  . Chronic rhinitis 06/19/2016  . Gait difficulty 06/21/2015  . Fibromyalgia 06/21/2015  . Conversion reaction 06/21/2015  . Depression 06/21/2015  . Syncope and collapse 06/21/2015  . Plica syndrome of left knee 01/06/2015  . Anxiety state 09/06/2014  . History of MI 09/06/2014  . Borderline hypertension 06/27/2013  . Leg pain, bilateral 06/14/2013  . Gastroparesis 10/26/2009  . Musculoskeletal chest pain 05/02/2009  . Hyperlipidemia LDL goal <100 11/18/2008  . HEPATIC CYST 11/18/2008    Past Surgical History:  Procedure Laterality Date  . ABDOMINAL HYSTERECTOMY     BSO  . APPENDECTOMY    . CESAREAN SECTION     x 2  . CHOLECYSTECTOMY    . CHONDROPLASTY Left 01/06/2015  Procedure: CHONDROPLASTY;  Surgeon: Marybelle Killings, MD;  Location: [facility];  Service: Orthopedics;  Laterality: Left;  . COLONOSCOPY    . ELBOW FRACTURE SURGERY     right  . KNEE ARTHROSCOPY WITH EXCISION PLICA Left 5/85/2778   Procedure: KNEE ARTHROSCOPY WITH EXCISION PLICA;  Surgeon: Marybelle Killings, MD;  Location: [facility];  Service: Orthopedics;  Laterality: Left;  . Lower Extremity Arterial Dopplers  07/06/2013   RABI 1.0, LABI 1.1.; No evidence of significant vascular atherogenic plaque  . NECK SURGERY    . NM MYOVIEW LTD  09/2014   LOW RISK. Normal EF of 65% with no regional wall motion  abnormalities. No ischemia or infarction.  . TONSILLECTOMY    . WRIST FRACTURE SURGERY     left, has plate    OB History    No data available       Home Medications    Prior to Admission medications   Medication Sig Start Date End Date Taking? Authorizing Provider  gabapentin (NEURONTIN) 100 MG capsule Take 100 mg by mouth 2 (two) times daily. 06/30/17  Yes [provider]  meclizine (ANTIVERT) 12.5 MG tablet Take 1 tablet (12.5 mg total) by mouth 3 (three) times daily as needed for dizziness. 06/30/17 06/30/18 Yes Biagio Borg, MD  PROAIR HFA 108 [phone] Base) MCG/ACT inhaler Take 1-2 puffs by mouth every 4 (four) hours as needed for shortness of breath or wheezing. 07/08/17  Yes [provider]  promethazine (PHENERGAN) 12.5 MG tablet Take 12.5 mg by mouth every 6 (six) hours as needed for nausea or vomiting.   Yes [provider]  valACYclovir (VALTREX) 500 MG tablet Take 500 mg by mouth daily.  05/16/17  Yes [provider]  aspirin 81 MG EC tablet Take 1 tablet (81 mg total) by mouth daily. Swallow whole. Patient not taking: Reported on 07/09/2017 06/30/17   Biagio Borg, MD  montelukast (SINGULAIR) 10 MG tablet Take 1 tablet (10 mg total) by mouth at bedtime. Patient not taking: Reported on 07/09/2017 05/16/17   Kennith Gain, MD  pantoprazole (PROTONIX) 40 MG tablet Take 1 tablet (40 mg total) by mouth daily. Patient not taking: Reported on 07/09/2017 06/30/17   Biagio Borg, MD  rosuvastatin (CRESTOR) 20 MG tablet Take 1 tablet (20 mg total) by mouth daily. Patient not taking: Reported on 07/09/2017 06/30/17   Biagio Borg, MD  traMADol (ULTRAM) 50 MG tablet Take 1 tablet (50 mg total) by mouth every 6 (six) hours as needed. 07/09/17   Davonna Belling, MD    Family History Family History  Problem Relation Age of Onset  . Emphysema Father   . Dementia Mother   . Diabetes Maternal Grandmother   . Diabetes Maternal Aunt   .  Diabetes Unknown        mat. cousin  . Heart disease Brother   . Colon cancer Neg Hx   . Allergic rhinitis Neg Hx   . Angioedema Neg Hx   . Asthma Neg Hx   . Atopy Neg Hx   . Eczema Neg Hx   . Immunodeficiency Neg Hx   . Urticaria Neg Hx     Social History Social History  Substance Use Topics  . Smoking status: Current Every Day Smoker    Packs/day: 1.00    Years: 39.00    Types: Cigarettes    Start date: 08/06/1977  . Smokeless tobacco: Never Used  Comment: Currently smoking 3 cigs per day.  Quit once for 4 months.   . Alcohol use No     Allergies   Bee venom; Codeine; Ibuprofen; Levofloxacin; Propoxyphene n-acetaminophen; Hydrocodone-acetaminophen; and Latex   Review of Systems Review of Systems  Constitutional: Negative for appetite change, fatigue and fever.  HENT: Positive for facial swelling. Negative for dental problem.   Respiratory: Negative for chest tightness and shortness of breath.   Cardiovascular: Negative for chest pain.  Gastrointestinal: Positive for nausea. Negative for abdominal pain.  Genitourinary: Negative for frequency.  Musculoskeletal: Positive for neck pain.  Skin: Negative for rash.  Neurological: Negative for seizures.  Psychiatric/Behavioral: Negative for confusion.     Physical Exam Updated Vital Signs BP (!) 135/104   Pulse 70   Temp 98.1 F (36.7 C) (Oral)   Resp 18   SpO2 100%   Physical Exam  Constitutional: She appears well-developed.  HENT:  Tenderness over some upper and lower jaw.  Some pain with opening closing jaw.  Does not tolerate exam all that well.  No fluctuance felt.  Some fullness of the left submandibular area.  No stridor  Cardiovascular: Normal rate.   Pulmonary/Chest: Effort normal.  Abdominal: Soft. There is no tenderness.  Musculoskeletal: She exhibits no edema.  Lymphadenopathy:    She has no cervical adenopathy.  Neurological: She is alert.  Skin: Skin is warm. Capillary refill takes less  than 2 seconds.     ED Treatments / Results  Labs (all labs ordered are listed, but only abnormal results are displayed) Labs Reviewed  CBC WITH DIFFERENTIAL/PLATELET  I-STAT CHEM 8, ED    EKG  EKG Interpretation None       Radiology Ct Soft Tissue Neck W Contrast  Result Date: 07/09/2017 CLINICAL DATA:  Head and neck swelling after starting nicotine transdermal patch. EXAM: CT NECK WITH CONTRAST TECHNIQUE: Multidetector CT imaging of the neck was performed using the standard protocol following the bolus administration of intravenous contrast. CONTRAST:  48mL ISOVUE-300 IOPAMIDOL (ISOVUE-300) INJECTION 61% COMPARISON:  None. FINDINGS: Pharynx and larynx: Normal. No mass or swelling. Salivary glands: No inflammation, mass, or stone. Thyroid: Normal. Lymph nodes: None enlarged or abnormal density. Vascular: Negative. Limited intracranial: Negative. Visualized orbits: Negative. Mastoids and visualized paranasal sinuses: Clear. Skeleton: Cervical fusion, C3-C7. Spondylosis. No acute findings. Upper chest: COPD changes.  No pneumothorax or lung nodule. Other: None. IMPRESSION: Unremarkable CT of the neck. Airway widely patent. No abnormal fluid collections or epiglottic swelling. Normal-appearing larynx and subglottic region. COPD. Electronically Signed   By: Staci Righter M.D.   On: 07/09/2017 15:34    Procedures Procedures (including critical care time)  Medications Ordered in ED Medications  iopamidol (ISOVUE-300) 61 % injection (not administered)  sodium chloride 0.9 % bolus 1,000 mL (0 mLs Intravenous Stopped 07/09/17 1445)  ondansetron (ZOFRAN) injection 4 mg (4 mg Intravenous Given 07/09/17 1227)  fentaNYL (SUBLIMAZE) injection 50 mcg (50 mcg Intravenous Given 07/09/17 1228)  iopamidol (ISOVUE-300) 61 % injection 75 mL (75 mLs Intravenous Contrast Given 07/09/17 1504)     Initial Impression / Assessment and Plan / ED Course  I have reviewed the triage vital signs and the  nursing notes.  Pertinent labs & imaging results that were available during my care of the patient were reviewed by me and considered in my medical decision making (see chart for details).     Patient with facial pain.  Some difficulty swallowing.  Also pain down the neck and up into  her head.  Has been going somewhat for a while.  CT scan reassuring.  Labs reassuring.  Will discharge home with some pain medicine to follow-up with her primary care doctors.  Pain is bilaterally.  Temporal arteritis felt less likely.  Trigeminal neuralgia considered but will need further workup and would not explain all of her symptoms.  Final Clinical Impressions(s) / ED Diagnoses   Final diagnoses:  Facial pain  Throat pain    New Prescriptions New Prescriptions   TRAMADOL (ULTRAM) 50 MG TABLET    Take 1 tablet (50 mg total) by mouth every 6 (six) hours as needed.     Davonna Belling, MD 07/09/17 [phone]

## 2017-07-15 ENCOUNTER — Ambulatory Visit (INDEPENDENT_AMBULATORY_CARE_PROVIDER_SITE_OTHER): Payer: Medicare Other | Admitting: *Deleted

## 2017-07-15 ENCOUNTER — Ambulatory Visit (INDEPENDENT_AMBULATORY_CARE_PROVIDER_SITE_OTHER): Payer: Medicare Other | Admitting: Pulmonary Disease

## 2017-07-15 ENCOUNTER — Telehealth: Payer: Self-pay | Admitting: *Deleted

## 2017-07-15 ENCOUNTER — Encounter: Payer: Self-pay | Admitting: Pulmonary Disease

## 2017-07-15 VITALS — BP 130/70 | HR 76 | Ht 64.0 in | Wt 140.5 lb

## 2017-07-15 DIAGNOSIS — J309 Allergic rhinitis, unspecified: Secondary | ICD-10-CM

## 2017-07-15 DIAGNOSIS — F17200 Nicotine dependence, unspecified, uncomplicated: Secondary | ICD-10-CM

## 2017-07-15 DIAGNOSIS — K219 Gastro-esophageal reflux disease without esophagitis: Secondary | ICD-10-CM | POA: Diagnosis not present

## 2017-07-15 DIAGNOSIS — J449 Chronic obstructive pulmonary disease, unspecified: Secondary | ICD-10-CM

## 2017-07-15 DIAGNOSIS — J302 Other seasonal allergic rhinitis: Secondary | ICD-10-CM

## 2017-07-15 MED ORDER — SUCRALFATE 1 GM/10ML PO SUSP: 1.0000 g | Freq: Three times a day (TID) | ORAL | 1 refills | Status: DC

## 2017-07-15 MED ORDER — NICOTINE 14 MG/24HR TD PT24: 14.0000 mg | MEDICATED_PATCH | Freq: Every day | TRANSDERMAL | 3 refills | Status: DC

## 2017-07-15 NOTE — Telephone Encounter (Signed)
Patient came in for injection today and wanted to let you know that she stopped smoking on 07/07/17. She had to go to ER that night for her blood pressure and decided she needed to stop. She is wearing nicotine patch now.

## 2017-07-15 NOTE — Progress Notes (Signed)
Subjective:    Patient ID: Lori [name], female    DOB: [birth year], 61 y.o.   MRN: [id number]  C.C.:  Follow-up for COPD Mixed Type w/ Emphysema, Chronic Seasonal Allergic Rhinitis, GERD & Tobacco Use Disorder.   HPI She reports she continues to have nausea and emesis with her reflux.   Mixed type COPD with emphysema: Patient has been treated with prednisone twice this year for exacerbations. Previously using Dulera and Spiriva with subsequent transition to Symbicort 80/4.5 and Singulair. Previously reported a cough producing a yellow mucus. She reports she isn't using her Symbicort. She is only using ProAir. She has been coughing more starting today. Minimal wheezing. The ProAir seems to help some.   Chronic seasonal allergic rhinitis: Follows with allergy/immunology. Patient currently on immunotherapy. Prescribed Singulair but wasn't able to take at last appointment due to nausea and vomiting.  She has increased sinus congestion & drainage. She isn't able to keep her Singulair down.   GERD: Prescribed both Dexilant and Zantac. Restarted on Zantac twice a day at last appointment with instructions to restart Dexilant able. She reports she has increased reflux and doesn't feel that it is helping. She still has severe reflux.   Tobacco use disorder: Secondhand smoke exposure from her husband within the home. Previously had cut back from 1 pack per day to 3 cigarettes per day. Primarily was smoking after she consumed a meal. Previously has tried using nicotine patches. She reports she hasn't smoked since 7am 10/22.   Review of Systems She does report burning in her chest with her reflux. No other chest pain or pressure. She reports she did have some abdominal pain with constipation that has resolved. No fever or chills.   Allergies  Allergen Reactions  . Bee Venom Swelling  . Codeine Nausea Only    CAUSES ULCERS  . Ibuprofen Nausea And Vomiting  . Levofloxacin Nausea Only  . Propoxyphene  N-Acetaminophen Nausea Only  . Hydrocodone-Acetaminophen Nausea Only    REACTION: "sick"  . Latex Rash    Current Outpatient Prescriptions on File Prior to Visit  Medication Sig Dispense Refill  . aspirin 81 MG EC tablet Take 1 tablet (81 mg total) by mouth daily. Swallow whole. 30 tablet 12  . gabapentin (NEURONTIN) 100 MG capsule Take 100 mg by mouth 2 (two) times daily.  3  . meclizine (ANTIVERT) 12.5 MG tablet Take 1 tablet (12.5 mg total) by mouth 3 (three) times daily as needed for dizziness. 30 tablet 1  . montelukast (SINGULAIR) 10 MG tablet Take 1 tablet (10 mg total) by mouth at bedtime. 30 tablet 5  . pantoprazole (PROTONIX) 40 MG tablet Take 1 tablet (40 mg total) by mouth daily. 90 tablet 3  . PROAIR HFA 108 (90 Base) MCG/ACT inhaler Take 1-2 puffs by mouth every 4 (four) hours as needed for shortness of breath or wheezing.    . promethazine (PHENERGAN) 12.5 MG tablet Take 12.5 mg by mouth every 6 (six) hours as needed for nausea or vomiting.    . rosuvastatin (CRESTOR) 20 MG tablet Take 1 tablet (20 mg total) by mouth daily. 90 tablet 3  . traMADol (ULTRAM) 50 MG tablet Take 1 tablet (50 mg total) by mouth every 6 (six) hours as needed. 8 tablet 0  . valACYclovir (VALTREX) 500 MG tablet Take 500 mg by mouth daily.   1   No current facility-administered medications on file prior to visit.     Past Medical History:  Diagnosis Date  .  Acute MI ([place])    x3 - by report only. She has had 3 negative Myoview stress test with no evidence of prior infarct.  . Allergic rhinitis   . Allergy   . Anemia   . Ankle fracture 05/2016  . Anxiety   . Arthritis   . Asthma   . Barrett esophagus 2007  . Chest pain 05-02-2009   echo  EF 55%  . COPD (chronic obstructive pulmonary disease) with chronic bronchitis (HCC)    & Emphysema  . Depression   . Duodenitis without mention of hemorrhage 2007  . Esophageal reflux 2007  . Esophageal stricture   . Full dentures   . H/O hiatal hernia     . Hiatal hernia 5400,8676  . Hyperlipidemia   . Multiple fractures    from falls, fx rt. elbow, fx left wrist, bilateral ankles  . Pneumonia 10/2016  . Restrictive lung disease 06/30/2017  . Ulcer     Past Surgical History:  Procedure Laterality Date  . ABDOMINAL HYSTERECTOMY     BSO  . APPENDECTOMY    . CESAREAN SECTION     x 2  . CHOLECYSTECTOMY    . CHONDROPLASTY Left 01/06/2015   Procedure: CHONDROPLASTY;  Surgeon: Marybelle Killings, MD;  Location: [facility];  Service: Orthopedics;  Laterality: Left;  . COLONOSCOPY    . ELBOW FRACTURE SURGERY     right  . KNEE ARTHROSCOPY WITH EXCISION PLICA Left 1/95/0932   Procedure: KNEE ARTHROSCOPY WITH EXCISION PLICA;  Surgeon: Marybelle Killings, MD;  Location: [facility];  Service: Orthopedics;  Laterality: Left;  . Lower Extremity Arterial Dopplers  07/06/2013   RABI 1.0, LABI 1.1.; No evidence of significant vascular atherogenic plaque  . NECK SURGERY    . NM MYOVIEW LTD  09/2014   LOW RISK. Normal EF of 65% with no regional wall motion abnormalities. No ischemia or infarction.  . TONSILLECTOMY    . WRIST FRACTURE SURGERY     left, has plate    Family History  Problem Relation Age of Onset  . Emphysema Father   . Dementia Mother   . Diabetes Maternal Grandmother   . Diabetes Maternal Aunt   . Diabetes Unknown        mat. cousin  . Heart disease Brother   . Colon cancer Neg Hx   . Allergic rhinitis Neg Hx   . Angioedema Neg Hx   . Asthma Neg Hx   . Atopy Neg Hx   . Eczema Neg Hx   . Immunodeficiency Neg Hx   . Urticaria Neg Hx     Social History   Social History  . Marital status: Married    Spouse name: [name]  . Number of children: 2  . Years of education: HS   Occupational History  . disable    Social History Main Topics  . Smoking status: Former Smoker    Packs/day: 1.00    Years: 39.00    Types: Cigarettes    Start date: 08/06/1977  . Smokeless tobacco: Never Used      Comment: stopped 10/22.   Marland Kitchen Alcohol use No  . Drug use: No  . Sexual activity: No   Other Topics Concern  . None   Social History Narrative   Patient lives at home spouse [name].  Has 2 daughters (82 & 81).   Currently "disabled".   Caffeine Use: tea 4 glasses daily.    Smokes ~1 1/2 PPD  05/15/15   Walks everyday - no set routine.      [facility] (12/03/16):   Originally from [facility]. 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 Currently has a dog. No bird exposure. No known mold exposure.       Objective:   Physical Exam BP 130/70 (BP Location: Left Arm, Cuff Size: Normal)   Pulse 76   Ht 5\' 4"  (1.626 m)   Wt 140 lb 8 oz (63.7 kg)   SpO2 96%   BMI 24.12 kg/m   General:  Accompanied by a friend today. Awake. No distress.  Integument:  No rash. No bruising. Warm. Dry. Extremities:  No cyanosis or clubbing.  HEENT:  No nasal turbinate swelling. Moist mucous membranes. No scleral icterus Cardiovascular:  Regular rate. No edema. Regular rhythm.  Pulmonary:  Good aeration bilaterally. Clear with auscultation. Abdomen: Soft. Normal bowel sounds. Mildly protuberant. Musculoskeletal:  Normal bulk and tone. No joint deformity or effusion appreciated. Neurological:  Cranial nerves 2-12 grossly in tact. No meningismus.   PFT 05/16/17: FVC 1.86 L (62%) FEV1 1.42 L (58%) FEV1/FVC 0.76 FEF 25-75 1.17 L (48%) positive bronchodilator response 01/31/17: FVC 1.96 L (59%) FEV1 1.31 L (51%) FEV1/FVC 0.67 FEF 25-75 0.76 L (32%) negative bronchodilator response 09/26/16: FVC 2.37 L (86%) FEV1 1.73 L (77%) FEV1/FVC 0.72 FEF 25-75 1.27 L (54%) 06/19/16: FVC 1.77 L (61%) FEV1 1.26 L (54%) FEV1/FVC 0.71 FEF 25-75 1.01 L (41%) negative bronchodilator response  6MWT 01/31/17:  Walked 340 meters / Baseline Sat 96% on RA / Nadir Sat 95% on RA  IMAGING CT NECK/SOFT TISSUE W/ CONTRAST 07/09/17 (per radiologist):  Unremarkable CT of the neck. Airway widely patent. No abnormal  fluid collections or epiglottic swelling. Normal-appearing larynx and subglottic region.  CXR PA/LAT 06/24/17 (personally reviewed by me):  No focal opacity. No mass appreciated. No pleural effusion. Heart normal in size & mediastinum normal in contour.  CT CHEST W/O 02/16/17 (previously reviewed by me):  No fracture. No opacity or nodule. No pleural effusion or thickening. No pathologic mediastinal adenopathy. No pericardial effusion. She has some dependent atelectasis.   CT CORONARY ANGIOGRAPHY 12/02/16 (previously reviewed by me): Per the radiologist patient's coronary calcium score is 0. Right dominant with normal coronary origin. No evidence of coronary artery disease. Reviewing the lung parenchyma shows some mild dependent atelectasis but no evidence of pleural thickening or pleural effusion on limited views. No pathologically enlarged mediastinal lymph nodes on limited views either. Apical predominant emphysematous changes are present.  CXR PA/LAT 11/23/16 (previously reviewed by me): No parenchymal mass or opacity appreciated. No pleural effusion. Heart normal in size & mediastinum normal in contour.  LABS 12/03/16 Alpha-1 antitrypsin: MM (173)  09/26/16 IgG: 524 IgA: 88 IgM: 72    Assessment & Plan:  61 y.o. female with mixed type COPD with emphysema. Her reflux remains severe and is a driving force behind her coughing and chest discomfort. Previous x-ray imaging demonstrates no parenchymal abnormality and patient did undergo evaluation in the emergency department recently with the diagnosis of trigeminal neuralgia as well as CT imaging of the neck. She denies any symptomatic benefit from Zantac or Dexilant. As such, I am trying the patient on Carafate to hopefully temporize her severe reflux before she can get back to see her gastroenterologist. I instructed the patient to contact my office if she had new breathing problems or questions before her next appointment.   1. Mixed type COPD  with emphysema: Patient to resume Singulair  when able. Holding off on restarting Symbicort for now.  2. Chronic seasonal allergic rhinitis: Increased symptoms likely secondary to inability to continue with Singulair. Plan restart as soon as able. 3. GERD: Prescribing Carafate suspension to use 4 times a day. I have sent an electronic message to her GI specialist to make him aware of her ongoing severe reflux. 4. Tobacco use disorder: Congratulated her on her tobacco abstinence. Refilled nicotine patches today. 5. Health maintenance: Status post Pneumovax May 2015 & Tdap August 2017. 6. Follow-up: Return to clinic in 3 months or sooner if needed.  Sonia Baller Ashok Cordia, M.D. [facility] Pager:  [phone] After 3pm or if no response, call [phone] 9:15 AM 07/15/17

## 2017-07-15 NOTE — Telephone Encounter (Signed)
That is fantastic to hear! Thanks for fowarding the message to me!   Salvatore Marvel, MD [facility]

## 2017-07-15 NOTE — Patient Instructions (Signed)
   I've sent a message to your GI specialist to see if he can see you sooner.   Keep up the nicotine patches.  Call my office if you have any new breathing problems or questions before your next appointment.

## 2017-07-17 ENCOUNTER — Ambulatory Visit (INDEPENDENT_AMBULATORY_CARE_PROVIDER_SITE_OTHER): Payer: Medicare Other | Admitting: Neurology

## 2017-07-17 ENCOUNTER — Telehealth: Payer: Self-pay | Admitting: Gastroenterology

## 2017-07-17 DIAGNOSIS — M79604 Pain in right leg: Secondary | ICD-10-CM | POA: Diagnosis not present

## 2017-07-17 DIAGNOSIS — M79605 Pain in left leg: Secondary | ICD-10-CM

## 2017-07-17 NOTE — Procedures (Signed)
[facility]  [address]  [place], [place] [address] Tel: [phone] Fax:  [phone] Test Date:  07/17/2017  Patient: Lori [name] DOB: 11/30/1955 Physician: Narda Amber, DO  Sex: Female Height: 5\' 4"  Ref Phys: Hulan Saas, DO  ID#: [id number] Temp: 33.1C Technician:    Patient Complaints: This is a 61 year old female referred for evaluation of bilateral leg cramps.  NCV & EMG Findings: Extensive electrodiagnostic testing of the right lower extremity and additional studies of the left shows:  1. Bilateral sural and superficial peroneal sensory responses are within normal limits. 2. Bilateral peroneal and tibial motor responses are within normal limits. 3. Right tibial H reflex study is within normal limits. 4. There is no evidence of active or chronic motor axon loss changes affecting any of the tested muscles. Motor unit configuration and recruitment pattern is within normal limits.  Impression: This is a normal study of the lower extremities.   In particular, there is no evidence of a lumbosacral radiculopathy, diffuse myopathy, or sensorimotor polyneuropathy.   ___________________________ Narda Amber, DO    Nerve Conduction Studies Anti Sensory Summary Table   Site NR Peak (ms) Norm Peak (ms) P-T Amp (V) Norm P-T Amp  Left Sup Peroneal Anti Sensory (Ant Lat Mall)  33.1C  12 cm    3.5 <4.6 9.8 >3  Right Sup Peroneal Anti Sensory (Ant Lat Mall)  12 cm    3.2 <4.6 9.0 >3  Left Sural Anti Sensory (Lat Mall)  33.1C  Calf    3.0 <4.6 12.7 >3  Right Sural Anti Sensory (Lat Mall)  33.1C  Calf    3.2 <4.6 12.7 >3   Motor Summary Table   Site NR Onset (ms) Norm Onset (ms) O-P Amp (mV) Norm O-P Amp Site1 Site2 Delta-0 (ms) Dist (cm) Vel (m/s) Norm Vel (m/s)  Left Peroneal Motor (Ext Dig Brev)  33.1C  Ankle    2.9 <6.0 3.5 >2.5 B Fib Ankle 7.2 34.0 47 >40  B Fib    10.1  3.3  Poplt B Fib 1.5 7.0 47 >40  Poplt    11.6  3.3           Right Peroneal Motor (Ext Dig Brev)  33.1C  Ankle    2.8 <6.0 4.5 >2.5 B Fib Ankle 6.8 38.0 56 >40  B Fib    9.6  4.5  Poplt B Fib 1.4 7.0 50 >40  Poplt    11.0  4.4         Left Tibial Motor (Abd Hall Brev)  33.1C  Ankle    3.1 <6.0 8.1 >4 Knee Ankle 8.3 36.0 43 >40  Knee    11.4  6.7         Right Tibial Motor (Abd Hall Brev)  33.1C  Ankle    5.7 <6.0 8.0 >4 Knee Ankle 6.0 38.0 63 >40  Knee    11.7  6.8          H Reflex Studies   NR H-Lat (ms) Lat Norm (ms) L-R H-Lat (ms)  Right Tibial (Gastroc)  33.1C     30.34 <35    EMG   Side Muscle Ins Act Fibs Psw Fasc Number Recrt Dur Dur. Amp Amp. Poly Poly. Comment  Right AntTibialis Nml Nml Nml Nml Nml Nml Nml Nml Nml Nml Nml Nml N/A  Right Gastroc Nml Nml Nml Nml Nml Nml Nml Nml Nml Nml Nml Nml N/A  Right RectFemoris Nml Nml Nml Nml  Nml Nml Nml Nml Nml Nml Nml Nml N/A  Right GluteusMed Nml Nml Nml Nml Nml Nml Nml Nml Nml Nml Nml Nml N/A  Right BicepsFemS Nml Nml Nml Nml Nml Nml Nml Nml Nml Nml Nml Nml N/A  Left AntTibialis Nml Nml Nml Nml Nml Nml Nml Nml Nml Nml Nml Nml N/A  Left Gastroc Nml Nml Nml Nml Nml Nml Nml Nml Nml Nml Nml Nml N/A  Left RectFemoris Nml Nml Nml Nml Nml Nml Nml Nml Nml Nml Nml Nml N/A  Left GluteusMed Nml Nml Nml Nml Nml Nml Nml Nml Nml Nml Nml Nml N/A  Left BicepsFemS Nml Nml Nml Nml Nml Nml Nml Nml Nml Nml Nml Nml N/A      Waveforms:

## 2017-07-17 NOTE — Telephone Encounter (Signed)
Spoke to patient, she states that for the last 2 months she has been vomiting, having abdominal pain, I see she does have a visit in December with Dr. Loletha Carrow but patient wanted to see APP sooner. Patient is still taking her carafate and protonix for stomach pain. She states she went to lunch today, came home and did have episode of vomiting. When asked what she had for lunch, she had gone to [employer], had a piece of chicken, biscuit, potatoes and gravy. Tried to educate patient that this choice of food is high in fat and grease and not the best choice if you are having stomach pain. Would suggest something more bland, low fat. I have made her an appointment to see APP tomorrow afternoon.

## 2017-07-18 ENCOUNTER — Emergency Department (HOSPITAL_COMMUNITY): Payer: Medicare Other

## 2017-07-18 ENCOUNTER — Emergency Department (HOSPITAL_COMMUNITY)
Admission: EM | Admit: 2017-07-18 | Discharge: 2017-07-18 | Disposition: A | Discharge status: Home / Self Care | Payer: Medicare Other | Attending: Emergency Medicine | Admitting: Emergency Medicine

## 2017-07-18 ENCOUNTER — Ambulatory Visit: Payer: Medicare Other | Admitting: Physician Assistant

## 2017-07-18 ENCOUNTER — Encounter (HOSPITAL_COMMUNITY): Payer: Self-pay | Admitting: Emergency Medicine

## 2017-07-18 DIAGNOSIS — Z87891 Personal history of nicotine dependence: Secondary | ICD-10-CM | POA: Diagnosis not present

## 2017-07-18 DIAGNOSIS — Z7982 Long term (current) use of aspirin: Secondary | ICD-10-CM | POA: Diagnosis not present

## 2017-07-18 DIAGNOSIS — I252 Old myocardial infarction: Secondary | ICD-10-CM | POA: Insufficient documentation

## 2017-07-18 DIAGNOSIS — R1013 Epigastric pain: Secondary | ICD-10-CM | POA: Diagnosis present

## 2017-07-18 DIAGNOSIS — Z79899 Other long term (current) drug therapy: Secondary | ICD-10-CM | POA: Diagnosis not present

## 2017-07-18 DIAGNOSIS — J449 Chronic obstructive pulmonary disease, unspecified: Secondary | ICD-10-CM | POA: Insufficient documentation

## 2017-07-18 DIAGNOSIS — R112 Nausea with vomiting, unspecified: Secondary | ICD-10-CM | POA: Diagnosis not present

## 2017-07-18 DIAGNOSIS — J45909 Unspecified asthma, uncomplicated: Secondary | ICD-10-CM | POA: Diagnosis not present

## 2017-07-18 DIAGNOSIS — Z91040 Latex allergy status: Secondary | ICD-10-CM | POA: Insufficient documentation

## 2017-07-18 DIAGNOSIS — R079 Chest pain, unspecified: Secondary | ICD-10-CM | POA: Diagnosis not present

## 2017-07-18 LAB — COMPREHENSIVE METABOLIC PANEL
ALBUMIN: 4 g/dL (ref 3.5–5.0)
ALK PHOS: 91 U/L (ref 38–126)
ALT: 17 U/L (ref 14–54)
ANION GAP: 9 (ref 5–15)
AST: 20 U/L (ref 15–41)
BILIRUBIN TOTAL: 0.6 mg/dL (ref 0.3–1.2)
BUN: 9 mg/dL (ref 6–20)
CALCIUM: 9 mg/dL (ref 8.9–10.3)
CO2: 18 mmol/L — ABNORMAL LOW (ref 22–32)
Chloride: 110 mmol/L (ref 101–111)
Creatinine, Ser: 0.89 mg/dL (ref 0.44–1.00)
GFR calc Af Amer: >60 mL/min (ref >60)
GLUCOSE: 119 mg/dL — AB (ref 65–99)
POTASSIUM: 3.6 mmol/L (ref 3.5–5.1)
Sodium: 137 mmol/L (ref 135–145)
TOTAL PROTEIN: 6.8 g/dL (ref 6.5–8.1)

## 2017-07-18 LAB — URINALYSIS, ROUTINE W REFLEX MICROSCOPIC
Bilirubin Urine: NEGATIVE
GLUCOSE, UA: NEGATIVE mg/dL
HGB URINE DIPSTICK: NEGATIVE
KETONES UR: NEGATIVE mg/dL
LEUKOCYTES UA: NEGATIVE
Nitrite: NEGATIVE
PROTEIN: NEGATIVE mg/dL
Specific Gravity, Urine: 1.006 (ref 1.005–1.030)
pH: 7 (ref 5.0–8.0)

## 2017-07-18 LAB — CBC
HEMATOCRIT: 37.6 % (ref 36.0–46.0)
Hemoglobin: 12.4 g/dL (ref 12.0–15.0)
MCH: 26.4 pg (ref 26.0–34.0)
MCHC: 33 g/dL (ref 30.0–36.0)
MCV: 80 fL (ref 78.0–100.0)
PLATELETS: 288 10*3/uL (ref 150–400)
RBC: 4.7 MIL/uL (ref 3.87–5.11)
RDW: 14.9 % (ref 11.5–15.5)
WBC: 8.6 10*3/uL (ref 4.0–10.5)

## 2017-07-18 LAB — LIPASE, BLOOD: Lipase: 19 U/L (ref 11–51)

## 2017-07-18 LAB — TROPONIN I

## 2017-07-18 MED ORDER — ONDANSETRON 4 MG PO TBDP: ORAL_TABLET | ORAL | 0 refills | Status: DC

## 2017-07-18 MED ORDER — GI COCKTAIL ~~LOC~~
30.0000 mL | Freq: Once | ORAL | Status: AC
  Administered 2017-07-18: 30 mL via ORAL
  Filled 2017-07-18: qty 30

## 2017-07-18 MED ORDER — PROMETHAZINE HCL 25 MG RE SUPP
25.0000 mg | Freq: Four times a day (QID) | RECTAL | 0 refills | Status: DC | PRN
Start: 2017-07-18 — End: 2017-11-19

## 2017-07-18 MED ORDER — PROCHLORPERAZINE EDISYLATE 5 MG/ML IJ SOLN
10.0000 mg | Freq: Once | INTRAMUSCULAR | Status: AC
  Administered 2017-07-18: 10 mg via INTRAVENOUS
  Filled 2017-07-18: qty 2

## 2017-07-18 MED ORDER — DIPHENHYDRAMINE HCL 50 MG/ML IJ SOLN
12.5000 mg | Freq: Once | INTRAMUSCULAR | Status: AC
  Administered 2017-07-18: 12.5 mg via INTRAVENOUS
  Filled 2017-07-18: qty 1

## 2017-07-18 MED ORDER — ONDANSETRON 4 MG PO TBDP
4.0000 mg | ORAL_TABLET | Freq: Once | ORAL | Status: AC | PRN
  Administered 2017-07-18: 4 mg via ORAL
  Filled 2017-07-18: qty 1

## 2017-07-18 NOTE — ED Provider Notes (Signed)
[facility] DEPT Provider Note   CSN: [id number] Arrival date & time: 07/18/17  1355     History   Chief Complaint Chief Complaint  Patient presents with  . Emesis  . Dizziness  . Headache  . Chest Pain    HPI Lori [name] is a 61 y.o. female.  61 yo F with two months of epigastric discomfort and vomiting.  Happens every time she eats.  Hx of GERD, feels the same.  Has not seen her GI doc for this.  She feels dehydrated and is started to have a headache.  Tried fried chicken last night without improvement.  Given zofran in triage with some improvement.  Has phenergan at home, but has been unable to keep it down.  Denies fevers, diarrhea.  Denies exertional symptoms.  Denies abdominal pain.  Denies urinary symptoms. Denies bloody or bilious emesis.    The history is provided by the patient and a friend.  Illness  This is a chronic problem. The current episode started more than 1 week ago. The problem occurs constantly. The problem has not changed since onset.Associated symptoms include headaches. Pertinent negatives include no chest pain and no shortness of breath. Nothing aggravates the symptoms. Nothing relieves the symptoms. She has tried nothing for the symptoms.    Past Medical History:  Diagnosis Date  . Acute MI ([place])    x3 - by report only. She has had 3 negative Myoview stress test with no evidence of prior infarct.  . Allergic rhinitis   . Allergy   . Anemia   . Ankle fracture 05/2016  . Anxiety   . Arthritis   . Asthma   . Barrett esophagus 2007  . Chest pain 05-02-2009   echo  EF 55%  . COPD (chronic obstructive pulmonary disease) with chronic bronchitis (HCC)    & Emphysema  . Depression   . Duodenitis without mention of hemorrhage 2007  . Esophageal reflux 2007  . Esophageal stricture   . Full dentures   . H/O hiatal hernia   . Hiatal hernia [id number]  . Hyperlipidemia   . Multiple fractures    from falls, fx rt.  elbow, fx left wrist, bilateral ankles  . Pneumonia 10/2016  . Restrictive lung disease 06/30/2017  . Ulcer     Patient Active Problem List   Diagnosis Date Noted  . Encounter for well adult exam with abnormal findings 06/30/2017  . Epigastric pain 06/30/2017  . Restrictive lung disease 06/30/2017  . Leg cramping 06/09/2017  . Elbow contusion 06/09/2017  . Right elbow pain 05/27/2017  . COPD mixed type ([place]) 01/31/2017  . Dizziness 01/20/2017  . Perennial allergic rhinitis 01/01/2017  . Moderate persistent asthma, uncomplicated 30/86/5784  . Pulmonary emphysema ([place]) 12/03/2016  . Tobacco use disorder 12/03/2016  . Chronic seasonal allergic rhinitis 12/03/2016  . Medication management 11/29/2016  . Cough 11/23/2016  . Rash 11/23/2016  . Vertigo 10/22/2016  . Gastroesophageal reflux disease with esophagitis 10/22/2016  . Non compliance w medication regimen 08/01/2016  . Allergic contact dermatitis due to metals 08/01/2016  . Migraine without aura and without status migrainosus, not intractable 07/22/2016  . Spasm of muscle of lower back 06/21/2016  . Chronic rhinitis 06/19/2016  . Gait difficulty 06/21/2015  . Fibromyalgia 06/21/2015  . Conversion reaction 06/21/2015  . Depression 06/21/2015  . Syncope and collapse 06/21/2015  . Plica syndrome of left knee 01/06/2015  . Anxiety state 09/06/2014  . History of MI 09/06/2014  .  Borderline hypertension 06/27/2013  . Leg pain, bilateral 06/14/2013  . Gastroparesis 10/26/2009  . Musculoskeletal chest pain 05/02/2009  . Hyperlipidemia LDL goal <100 11/18/2008  . HEPATIC CYST 11/18/2008    Past Surgical History:  Procedure Laterality Date  . ABDOMINAL HYSTERECTOMY     BSO  . APPENDECTOMY    . CESAREAN SECTION     x 2  . CHOLECYSTECTOMY    . CHONDROPLASTY Left 01/06/2015   Procedure: CHONDROPLASTY;  Surgeon: Marybelle Killings, MD;  Location: [facility];  Service: Orthopedics;  Laterality: Left;  . COLONOSCOPY     . ELBOW FRACTURE SURGERY     right  . KNEE ARTHROSCOPY WITH EXCISION PLICA Left 0/25/8527   Procedure: KNEE ARTHROSCOPY WITH EXCISION PLICA;  Surgeon: Marybelle Killings, MD;  Location: [facility];  Service: Orthopedics;  Laterality: Left;  . Lower Extremity Arterial Dopplers  07/06/2013   RABI 1.0, LABI 1.1.; No evidence of significant vascular atherogenic plaque  . NECK SURGERY    . NM MYOVIEW LTD  09/2014   LOW RISK. Normal EF of 65% with no regional wall motion abnormalities. No ischemia or infarction.  . TONSILLECTOMY    . WRIST FRACTURE SURGERY     left, has plate    OB History    No data available       Home Medications    Prior to Admission medications   Medication Sig Start Date End Date Taking? Authorizing Provider  nitroGLYCERIN (NITROSTAT) 0.3 MG SL tablet Place 0.3 mg under the tongue every 5 (five) minutes as needed for chest pain.   Yes [provider]  aspirin 81 MG EC tablet Take 1 tablet (81 mg total) by mouth daily. Swallow whole. 06/30/17   Biagio Borg, MD  meclizine (ANTIVERT) 12.5 MG tablet Take 1 tablet (12.5 mg total) by mouth 3 (three) times daily as needed for dizziness. 06/30/17 06/30/18  Biagio Borg, MD  montelukast (SINGULAIR) 10 MG tablet Take 1 tablet (10 mg total) by mouth at bedtime. 05/16/17   Kennith Gain, MD  nicotine (NICODERM CQ - DOSED IN MG/24 HOURS) 14 mg/24hr patch Place 1 patch (14 mg total) onto the skin daily. 07/15/17   Javier Glazier, MD  ondansetron (ZOFRAN ODT) 4 MG disintegrating tablet 4mg  ODT q4 hours prn nausea/vomit 07/18/17   Deno Etienne, DO  pantoprazole (PROTONIX) 40 MG tablet Take 1 tablet (40 mg total) by mouth daily. 06/30/17   Biagio Borg, MD  PROAIR HFA 108 [phone] Base) MCG/ACT inhaler Take 1-2 puffs by mouth every 4 (four) hours as needed for shortness of breath or wheezing. 07/08/17   [provider]  promethazine (PHENERGAN) 12.5 MG tablet Take 12.5 mg by mouth every 6 (six)  hours as needed for nausea or vomiting.    [provider]  promethazine (PHENERGAN) 25 MG suppository Place 1 suppository (25 mg total) rectally every 6 (six) hours as needed for nausea or vomiting. 07/18/17   Deno Etienne, DO  rosuvastatin (CRESTOR) 20 MG tablet Take 1 tablet (20 mg total) by mouth daily. 06/30/17   Biagio Borg, MD  sucralfate (CARAFATE) 1 GM/10ML suspension Take 10 mLs (1 g total) by mouth 4 (four) times daily -  with meals and at bedtime. 07/15/17   Javier Glazier, MD  traMADol (ULTRAM) 50 MG tablet Take 1 tablet (50 mg total) by mouth every 6 (six) hours as needed. 07/09/17   Davonna Belling, MD  valACYclovir Estell Harpin)  500 MG tablet Take 500 mg by mouth daily.  05/16/17   [provider]    Family History Family History  Problem Relation Age of Onset  . Emphysema Father   . Dementia Mother   . Diabetes Maternal Grandmother   . Diabetes Maternal Aunt   . Diabetes Unknown        mat. cousin  . Heart disease Brother   . Colon cancer Neg Hx   . Allergic rhinitis Neg Hx   . Angioedema Neg Hx   . Asthma Neg Hx   . Atopy Neg Hx   . Eczema Neg Hx   . Immunodeficiency Neg Hx   . Urticaria Neg Hx     Social History Social History  Substance Use Topics  . Smoking status: Former Smoker    Packs/day: 1.00    Years: 39.00    Types: Cigarettes    Start date: 08/06/1977  . Smokeless tobacco: Never Used     Comment: stopped 10/22.   Marland Kitchen Alcohol use No     Allergies   Bee venom; Codeine; Ibuprofen; Levofloxacin; Propoxyphene n-acetaminophen; Hydrocodone-acetaminophen; and Latex   Review of Systems Review of Systems  Constitutional: Negative for chills and fever.  HENT: Negative for congestion and rhinorrhea.   Eyes: Negative for redness and visual disturbance.  Respiratory: Negative for shortness of breath and wheezing.   Cardiovascular: Negative for chest pain and palpitations.  Gastrointestinal: Positive for nausea and vomiting.    Genitourinary: Negative for dysuria and urgency.  Musculoskeletal: Negative for arthralgias and myalgias.  Skin: Negative for pallor and wound.  Neurological: Positive for weakness (generalized) and headaches. Negative for dizziness.     Physical Exam Updated Vital Signs BP (!) 105/56 (BP Location: Left Arm)   Pulse 94   Temp 98 F (36.7 C) (Oral)   Resp (!) 21   SpO2 98%   Physical Exam  Constitutional: She is oriented to person, place, and time. She appears well-developed and well-nourished. No distress.  HENT:  Head: Normocephalic and atraumatic.  Eyes: Pupils are equal, round, and reactive to light. EOM are normal.  Neck: Normal range of motion. Neck supple.  Cardiovascular: Normal rate and regular rhythm.  Exam reveals no gallop and no friction rub.   No murmur heard. Pulmonary/Chest: Effort normal. She has no wheezes. She has no rales.  Abdominal: Soft. She exhibits no distension and no mass. There is no tenderness. There is no guarding.  Musculoskeletal: She exhibits no edema or tenderness.  Neurological: She is alert and oriented to person, place, and time.  Skin: Skin is warm and dry. She is not diaphoretic.  Psychiatric: She has a normal mood and affect. Her behavior is normal.  Nursing note and vitals reviewed.    ED Treatments / Results  Labs (all labs ordered are listed, but only abnormal results are displayed) Labs Reviewed  COMPREHENSIVE METABOLIC PANEL - Abnormal; Notable for the following:       Result Value   CO2 18 (*)    Glucose, Bld 119 (*)    All other components within normal limits  CBC  LIPASE, BLOOD  URINALYSIS, ROUTINE W REFLEX MICROSCOPIC  TROPONIN I    EKG  EKG Interpretation  Date/Time:  Friday July 18 2017 14:09:04 EDT Ventricular Rate:  104 PR Interval:    QRS Duration: 88 QT Interval:  353 QTC Calculation: 465 R Axis:   81 Text Interpretation:  Sinus tachycardia Right atrial enlargement Borderline right axis deviation  Low voltage,  precordial leads Baseline wander in lead(s) V5 V6 SINCE LAST TRACING HEART RATE HAS INCREASED Confirmed by Orlie Dakin [id number]) on 07/18/2017 2:22:47 PM       Radiology Dg Chest 2 View  Result Date: 07/18/2017 CLINICAL DATA:  Upper mid chest pain. EXAM: CHEST  2 VIEW COMPARISON:  Two-view chest x-ray 06/24/2017 FINDINGS: The heart size and mediastinal contours are within normal limits. Both lungs are clear. The visualized skeletal structures are unremarkable. IMPRESSION: Negative two view chest x-ray Electronically Signed   By: San Morelle M.D.   On: 07/18/2017 15:37    Procedures Procedures (including critical care time)  Medications Ordered in ED Medications  ondansetron (ZOFRAN-ODT) disintegrating tablet 4 mg (4 mg Oral Given 07/18/17 1411)  prochlorperazine (COMPAZINE) injection 10 mg (10 mg Intravenous Given 07/18/17 1628)  diphenhydrAMINE (BENADRYL) injection 12.5 mg (12.5 mg Intravenous Given 07/18/17 1628)  gi cocktail (Maalox,Lidocaine,Donnatal) (30 mLs Oral Given 07/18/17 1628)     Initial Impression / Assessment and Plan / ED Course  I have reviewed the triage vital signs and the nursing notes.  Pertinent labs & imaging results that were available during my care of the patient were reviewed by me and considered in my medical decision making (see chart for details).     61 yo F with epigastric burning worse when she eats and vomiting.  Appears well hydrated and non toxic.  No noted abdominal tenderness.  Will give headache cocktail, fluids, oral trial.   6:00 PM:  I have discussed the diagnosis/risks/treatment options with the patient and family and believe the pt to be eligible for discharge home to follow-up with PCP. We also discussed returning to the ED immediately if new or worsening sx occur. We discussed the sx which are most concerning (e.g., sudden worsening pain, fever, inability to tolerate by mouth) that necessitate immediate return.  Medications administered to the patient during their visit and any new prescriptions provided to the patient are listed below.  Medications given during this visit Medications  ondansetron (ZOFRAN-ODT) disintegrating tablet 4 mg (4 mg Oral Given 07/18/17 1411)  prochlorperazine (COMPAZINE) injection 10 mg (10 mg Intravenous Given 07/18/17 1628)  diphenhydrAMINE (BENADRYL) injection 12.5 mg (12.5 mg Intravenous Given 07/18/17 1628)  gi cocktail (Maalox,Lidocaine,Donnatal) (30 mLs Oral Given 07/18/17 1628)     The patient appears reasonably screen and/or stabilized for discharge and I doubt any other medical condition or other [facility] requiring further screening, evaluation, or treatment in the ED at this time prior to discharge.    Final Clinical Impressions(s) / ED Diagnoses   Final diagnoses:  Nausea and vomiting in adult    New Prescriptions Discharge Medication List as of 07/18/2017  5:14 PM    START taking these medications   Details  ondansetron (ZOFRAN ODT) 4 MG disintegrating tablet 4mg  ODT q4 hours prn nausea/vomit, Print    promethazine (PHENERGAN) 25 MG suppository Place 1 suppository (25 mg total) rectally every 6 (six) hours as needed for nausea or vomiting., Starting Fri 07/18/2017, Print         Tyrone Nine, Linna Hoff, DO 07/18/17 1800

## 2017-07-18 NOTE — Discharge Instructions (Signed)
Follow up with GI.  Try to avoid things that make this worse.

## 2017-07-18 NOTE — ED Triage Notes (Signed)
Patient c/o abd pain with n/v, dizziness with walking around and headache that started up this morning.  Patient reports that she had these same symptoms in October. Patient adds that she took Nitro last night around 9 pm that helped but chest pain started back up today and denies taking any Nitro today.

## 2017-07-18 NOTE — ED Notes (Signed)
ED Provider at bedside. 

## 2017-07-19 NOTE — Progress Notes (Signed)
[name] [place], [place] [address] Phone: [phone] Subjective:     CC: Elbow pain follow-up, leg cramping follow-up  [id number]  Lori [name] is a 61 y.o. female coming in with complaint of leg cramping. Patient was found to be likely iron deficient anemia. Patient was to take over-the-counter medications but was somewhat noncompliant. When she was taking the iron she is making some very mild improvement. Patient was started on gabapentin in case there was any lumbar stenosis. Patient was also sent for an EMG. EMG showed that there is no neurologic component to patient's pain. Patient also laboratory workup showed Very mild iron deficiency otherwise unremarkable. Patient states still having pain.  Has had sleeping worsening symptoms.  Patient states that unfortunately continues to have the pain better nothing is making her fearful.  Concerned that something in her back is likely contributing to it more now.  Elbow pain is completely resolved       Past Medical History:  Diagnosis Date  . Acute MI ([place])    x3 - by report only. She has had 3 negative Myoview stress test with no evidence of prior infarct.  . Allergic rhinitis   . Allergy   . Anemia   . Ankle fracture 05/2016  . Anxiety   . Arthritis   . Asthma   . Barrett esophagus 2007  . Chest pain 05-02-2009   echo  EF 55%  . COPD (chronic obstructive pulmonary disease) with chronic bronchitis (HCC)    & Emphysema  . Depression   . Duodenitis without mention of hemorrhage 2007  . Esophageal reflux 2007  . Esophageal stricture   . Full dentures   . H/O hiatal hernia   . Hiatal hernia [id number]  . Hyperlipidemia   . Multiple fractures    from falls, fx rt. elbow, fx left wrist, bilateral ankles  . Pneumonia 10/2016  . Restrictive lung disease 06/30/2017  . Ulcer    Past Surgical History:  Procedure Laterality Date  . ABDOMINAL HYSTERECTOMY     BSO  . APPENDECTOMY     . CESAREAN SECTION     x 2  . CHOLECYSTECTOMY    . COLONOSCOPY    . ELBOW FRACTURE SURGERY     right  . Lower Extremity Arterial Dopplers  07/06/2013   RABI 1.0, LABI 1.1.; No evidence of significant vascular atherogenic plaque  . NECK SURGERY    . NM MYOVIEW LTD  09/2014   LOW RISK. Normal EF of 65% with no regional wall motion abnormalities. No ischemia or infarction.  . TONSILLECTOMY    . WRIST FRACTURE SURGERY     left, has plate   Social History   Socioeconomic History  . Marital status: Married    Spouse name: [name]  . Number of children: 2  . Years of education: HS  . Highest education level: None  Social Needs  . Financial resource strain: None  . Food insecurity - worry: None  . Food insecurity - inability: None  . Transportation needs - medical: None  . Transportation needs - non-medical: None  Occupational History  . Occupation: disable  Tobacco Use  . Smoking status: Former Smoker    Packs/day: 1.00    Years: 39.00    Pack years: 39.00    Types: Cigarettes    Start date: 08/06/1977  . Smokeless tobacco: Never Used  . Tobacco comment: stopped 10/22.   Substance and Sexual Activity  .  Alcohol use: No    Alcohol/week: 0.0 oz  . Drug use: No  . Sexual activity: No  Other Topics Concern  . None  Social History Narrative   Patient lives at home spouse [name].  Has 2 daughters (68 & 55).   Currently "disabled".   Caffeine Use: tea 4 glasses daily.    Smokes ~1 1/2 PPD  05/15/15   Walks everyday - no set routine.      [facility] (12/03/16):   Originally from [facility]. 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 Currently has a dog. No bird exposure. No known mold exposure.    Allergies  Allergen Reactions  . Bee Venom Swelling  . Codeine Nausea Only    CAUSES ULCERS  . Ibuprofen Nausea And Vomiting  . Levofloxacin Nausea Only  . Propoxyphene N-Acetaminophen Nausea Only  . Hydrocodone-Acetaminophen Nausea Only    REACTION:  "sick"  . Latex Rash   Family History  Problem Relation Age of Onset  . Emphysema Father   . Dementia Mother   . Diabetes Maternal Grandmother   . Diabetes Maternal Aunt   . Diabetes Unknown        mat. cousin  . Heart disease Brother   . Colon cancer Neg Hx   . Allergic rhinitis Neg Hx   . Angioedema Neg Hx   . Asthma Neg Hx   . Atopy Neg Hx   . Eczema Neg Hx   . Immunodeficiency Neg Hx   . Urticaria Neg Hx      Past medical history, social, surgical and family history all reviewed in electronic medical record.  No pertanent information unless stated regarding to the chief complaint.   Review of Systems:Review of systems updated and as accurate as of 07/21/17  No headache, visual changes, nausea, vomiting, diarrhea, constipation, dizziness, abdominal pain, skin rash, fevers, chills, night sweats, weight loss, swollen lymph nodes, body aches, joint swelling, chest pain, shortness of breath, mood changes. + muscle aches and cramping.   Objective  Blood pressure 130/82, pulse 79, height 5\' 4"  (1.626 m), weight 141 lb (64 kg), SpO2 95 %. Systems examined below as of 07/21/17   General: No apparent distress alert and oriented x3 mood and affect normal, dressed appropriately.  HEENT: Pupils equal, extraocular movements intact  Respiratory: Patient's speak in full sentences and does not appear short of breath  Cardiovascular: No lower extremity edema, non tender, no erythema  Skin: Warm dry intact with no signs of infection or rash on extremities or on axial skeleton.  Abdomen: Soft nontender  Neuro: Cranial nerves II through XII are intact, neurovascularly intact in all extremities with 2+ DTRs and 2+ pulses.  Lymph: No lymphadenopathy of posterior or anterior cervical chain or axillae bilaterally.  Gait normal with good balance and coordination.  MSK: Diffuse tender with full range of motion and good stability and symmetric strength and tone of shoulders, elbows, wrist, hip, knee  and ankles bilaterally. Arthritic changes   Back Exam:  Inspection: degenerative scoliosis Motion: Flexion 25 deg, Extension 20 deg, Side Bending to 25 deg bilaterally,  Rotation to 35 deg bilaterally  SLR laying: Negative  XSLR laying: Negative  Palpable tenderness: TTP  FABER: Bilaterally. Sensory change: Gross sensation intact to all lumbar and sacral dermatomes.  Reflexes: 2+ at both patellar tendons, 2+ at achilles tendons, Babinski's downgoing.  Strength at foot  4 Out of 5 but symmetric Gait unremarkable.    Impression and Recommendations:  This case required medical decision making of moderate complexity.      Note: This dictation was prepared with Dragon dictation along with smaller phrase technology. Any transcriptional errors that result from this process are unintentional.

## 2017-07-21 ENCOUNTER — Ambulatory Visit (INDEPENDENT_AMBULATORY_CARE_PROVIDER_SITE_OTHER): Payer: Medicare Other | Admitting: Family Medicine

## 2017-07-21 ENCOUNTER — Encounter: Payer: Self-pay | Admitting: Family Medicine

## 2017-07-21 VITALS — BP 130/82 | HR 79 | Ht 64.0 in | Wt 141.0 lb

## 2017-07-21 DIAGNOSIS — S32020A Wedge compression fracture of second lumbar vertebra, initial encounter for closed fracture: Secondary | ICD-10-CM | POA: Diagnosis not present

## 2017-07-21 DIAGNOSIS — M5136 Other intervertebral disc degeneration, lumbar region: Secondary | ICD-10-CM | POA: Diagnosis not present

## 2017-07-21 NOTE — Assessment & Plan Note (Signed)
Discussed with patient at great length.  We discussed icing regimen and home exercises.  Patient's EMG has been unremarkable except as well as laboratory workup.  I am concerned that the patient is not making any significant improvement at the moment.  Patient could have an occult fracture causing some of the discomfort and pain.  We will get this.  Many other comorbidities that could be contributing.  Patient is in agreement with the plan at the moment for the CT scan.  Depending on findings we will discuss further follow-up.  Could be a candidate for epidural injections.

## 2017-07-21 NOTE — Patient Instructions (Signed)
Good to see you  Lori [name] is your friend.  We will get CT scan of your back.  Labs and EMG look good.  Lets see what it shows.  I will call you with the results.

## 2017-07-25 ENCOUNTER — Other Ambulatory Visit: Payer: Self-pay | Admitting: Internal Medicine

## 2017-07-28 ENCOUNTER — Ambulatory Visit
Admission: RE | Admit: 2017-07-28 | Discharge: 2017-07-28 | Disposition: A | Discharge status: Home / Self Care | Payer: Medicare Other | Source: Ambulatory Visit | Attending: Family Medicine | Admitting: Family Medicine

## 2017-07-28 DIAGNOSIS — S32020A Wedge compression fracture of second lumbar vertebra, initial encounter for closed fracture: Secondary | ICD-10-CM

## 2017-07-28 DIAGNOSIS — S32000A Wedge compression fracture of unspecified lumbar vertebra, initial encounter for closed fracture: Secondary | ICD-10-CM | POA: Diagnosis not present

## 2017-07-29 NOTE — Progress Notes (Signed)
Left message for patient to call back  

## 2017-07-30 ENCOUNTER — Other Ambulatory Visit: Payer: Self-pay | Admitting: *Deleted

## 2017-07-30 DIAGNOSIS — M5136 Other intervertebral disc degeneration, lumbar region: Secondary | ICD-10-CM

## 2017-07-31 ENCOUNTER — Ambulatory Visit: Payer: Medicare Other | Admitting: Nurse Practitioner

## 2017-08-12 ENCOUNTER — Ambulatory Visit
Admission: RE | Admit: 2017-08-12 | Discharge: 2017-08-12 | Disposition: A | Discharge status: Home / Self Care | Payer: Medicare Other | Source: Ambulatory Visit | Attending: Family Medicine | Admitting: Family Medicine

## 2017-08-12 ENCOUNTER — Telehealth: Payer: Self-pay | Admitting: Family Medicine

## 2017-08-12 DIAGNOSIS — M5136 Other intervertebral disc degeneration, lumbar region: Secondary | ICD-10-CM

## 2017-08-12 DIAGNOSIS — M47817 Spondylosis without myelopathy or radiculopathy, lumbosacral region: Secondary | ICD-10-CM | POA: Diagnosis not present

## 2017-08-12 MED ORDER — METHYLPREDNISOLONE ACETATE 40 MG/ML INJ SUSP (RADIOLOG
120.0000 mg | Freq: Once | INTRAMUSCULAR | Status: AC
  Administered 2017-08-12: 120 mg via EPIDURAL

## 2017-08-12 MED ORDER — IOPAMIDOL (ISOVUE-M 200) INJECTION 41%
1.0000 mL | Freq: Once | INTRAMUSCULAR | Status: AC
  Administered 2017-08-12: 1 mL via EPIDURAL

## 2017-08-12 NOTE — Telephone Encounter (Signed)
Spoke to pt, per dr Tamala Julian he recommended that she take Tylenol or Ibuprofen OTC. Pt understood.

## 2017-08-12 NOTE — Telephone Encounter (Signed)
Copied from [place]. Topic: General - Other >> Aug 12, 2017  3:45 PM Carolyn Stare wrote: Reason for CRM    pt said she had a procedure done and now she is in pain and is asking if something can be called in for her    pharmacy [facility] at [place]

## 2017-08-12 NOTE — Discharge Instructions (Signed)

## 2017-08-13 ENCOUNTER — Telehealth: Payer: Self-pay | Admitting: Radiology

## 2017-08-13 NOTE — Progress Notes (Signed)
Patient called to report localized pain and swelling in the right low back at the site of the epidural injection performed yesterday. No warmth, drainage, fever, or chills. No increased pain, numbness, or weakness in the legs. Patient stated this area was very sensitive and tender prior to the procedure as well. Advised patient that soreness at the injection site and a transient worsening of pre-existing symptoms were not uncommon. Advised to continue conservative measures (OTC pain medication, intermittent ice) and to call back if symptoms did not improve. Advised to seek immediate medical attention for fever or new neurological symptoms.

## 2017-08-15 ENCOUNTER — Ambulatory Visit (INDEPENDENT_AMBULATORY_CARE_PROVIDER_SITE_OTHER): Payer: Medicare Other | Admitting: Family Medicine

## 2017-08-15 ENCOUNTER — Encounter (HOSPITAL_COMMUNITY): Payer: Self-pay

## 2017-08-15 ENCOUNTER — Emergency Department (HOSPITAL_COMMUNITY): Payer: Medicare Other

## 2017-08-15 ENCOUNTER — Encounter: Payer: Self-pay | Admitting: Family Medicine

## 2017-08-15 VITALS — BP 120/82 | HR 85 | Ht 64.0 in | Wt 143.0 lb

## 2017-08-15 DIAGNOSIS — Z87891 Personal history of nicotine dependence: Secondary | ICD-10-CM | POA: Diagnosis not present

## 2017-08-15 DIAGNOSIS — M549 Dorsalgia, unspecified: Secondary | ICD-10-CM

## 2017-08-15 DIAGNOSIS — R0789 Other chest pain: Secondary | ICD-10-CM | POA: Diagnosis not present

## 2017-08-15 DIAGNOSIS — Z7982 Long term (current) use of aspirin: Secondary | ICD-10-CM | POA: Diagnosis not present

## 2017-08-15 DIAGNOSIS — R079 Chest pain, unspecified: Secondary | ICD-10-CM | POA: Diagnosis not present

## 2017-08-15 DIAGNOSIS — M5416 Radiculopathy, lumbar region: Secondary | ICD-10-CM | POA: Diagnosis not present

## 2017-08-15 DIAGNOSIS — J449 Chronic obstructive pulmonary disease, unspecified: Secondary | ICD-10-CM | POA: Diagnosis not present

## 2017-08-15 DIAGNOSIS — Z91040 Latex allergy status: Secondary | ICD-10-CM | POA: Diagnosis not present

## 2017-08-15 DIAGNOSIS — I252 Old myocardial infarction: Secondary | ICD-10-CM | POA: Insufficient documentation

## 2017-08-15 DIAGNOSIS — Z79899 Other long term (current) drug therapy: Secondary | ICD-10-CM | POA: Insufficient documentation

## 2017-08-15 DIAGNOSIS — G43719 Chronic migraine without aura, intractable, without status migrainosus: Secondary | ICD-10-CM | POA: Diagnosis not present

## 2017-08-15 DIAGNOSIS — R0602 Shortness of breath: Secondary | ICD-10-CM | POA: Diagnosis not present

## 2017-08-15 LAB — BASIC METABOLIC PANEL
Anion gap: 9 (ref 5–15)
BUN: 18 mg/dL (ref 6–20)
CALCIUM: 9 mg/dL (ref 8.9–10.3)
CO2: 21 mmol/L — AB (ref 22–32)
CREATININE: 0.88 mg/dL (ref 0.44–1.00)
Chloride: 104 mmol/L (ref 101–111)
Glucose, Bld: 108 mg/dL — ABNORMAL HIGH (ref 65–99)
Potassium: 4.2 mmol/L (ref 3.5–5.1)
SODIUM: 134 mmol/L — AB (ref 135–145)

## 2017-08-15 LAB — CBC
HCT: 36.3 % (ref 36.0–46.0)
Hemoglobin: 11.7 g/dL — ABNORMAL LOW (ref 12.0–15.0)
MCH: 26.2 pg (ref 26.0–34.0)
MCHC: 32.2 g/dL (ref 30.0–36.0)
MCV: 81.4 fL (ref 78.0–100.0)
PLATELETS: 358 10*3/uL (ref 150–400)
RBC: 4.46 MIL/uL (ref 3.87–5.11)
RDW: 15.8 % — ABNORMAL HIGH (ref 11.5–15.5)
WBC: 16.7 10*3/uL — ABNORMAL HIGH (ref 4.0–10.5)

## 2017-08-15 LAB — I-STAT TROPONIN, ED: TROPONIN I, POC: 0 ng/mL (ref 0.00–0.08)

## 2017-08-15 MED ORDER — GABAPENTIN 100 MG PO CAPS: 200.0000 mg | ORAL_CAPSULE | Freq: Every day | ORAL | 3 refills | Status: DC

## 2017-08-15 NOTE — ED Triage Notes (Signed)
Pt from home with ems c.o chest tightness at 7pm tonight with SOB. Pt took 2 Nitro PTA and ems gave 1 Nitro en route with no relief and 342 ASA. Pain 10/10. BP 97/56 HR 84 sinus rhythm, 99% on 2L applied by EMS. NAD

## 2017-08-15 NOTE — Assessment & Plan Note (Signed)
Spent  25 minutes with patient face-to-face and had greater than 50% of counseling including as described in assessment and plan.  Patient signs and symptoms as well as CT scan correspond to an L5 nerve root impingement.  We discussed different treatment options and patient elected to try the gabapentin.  We discussed the possibility of repeating the nerve root injection versus a possible epidural.  Patient would like something more definitive and is looking for potential surgical intervention.  Patient will be referred to neurosurgery for further evaluation and to discuss other possible management.  Patient can call us if she has any questions.

## 2017-08-15 NOTE — Patient Instructions (Signed)
Good to see you  I am sorry not  A lot better We will try gabapentin 200mg  at night We will get you in with Neurosurgery to discuss.  Write me if you have questions Happy holidays!

## 2017-08-15 NOTE — Progress Notes (Signed)
[name] [place], [place] [address] Phone: [phone] Subjective:    I'm seeing this patient by the request  of:    CC: Elbow pain leg cramp following up  SEG:[id number]  Lori [name] is a 61 y.o. female coming in with complaint of patient was having leg cramping and was found to be more iron deficient.  Patient was supposed to be taking over-the-counter medications and was noncompliant.  May be a very mild improvement with this.  Started on gabapentin for any chance of the lumbar stenosis.  Patient was sent for an EMG which was unremarkable showing no neurologic compromise.  Laboratory workup confirmed iron deficiency otherwise unremarkable.  Patient was significantly concerned and we did get a CT scan of the back this was independently visualized by me.  Patient was found to have progressive degenerative changes at L4-L5 causing a right L5 nerve root impingement.  Patient was sent for a nerve root injection on August 12, 2017.  Patient states that unfortunately the injection only gave minutes of relief she states.  States that the pain is severe.  Affecting daily activities such as walking.  Unable to sleep well.  Patient has 9 out of 10 pain she states       Past Medical History:  Diagnosis Date  . Acute MI ([place])    x3 - by report only. She has had 3 negative Myoview stress test with no evidence of prior infarct.  . Allergic rhinitis   . Allergy   . Anemia   . Ankle fracture 05/2016  . Anxiety   . Arthritis   . Asthma   . Barrett esophagus 2007  . Chest pain 05-02-2009   echo  EF 55%  . COPD (chronic obstructive pulmonary disease) with chronic bronchitis (HCC)    & Emphysema  . Depression   . Duodenitis without mention of hemorrhage 2007  . Esophageal reflux 2007  . Esophageal stricture   . Full dentures   . H/O hiatal hernia   . Hiatal hernia 3710,6269  . Hyperlipidemia   . Multiple fractures    from falls, fx rt. elbow,  fx left wrist, bilateral ankles  . Pneumonia 10/2016  . Restrictive lung disease 06/30/2017  . Ulcer    Past Surgical History:  Procedure Laterality Date  . ABDOMINAL HYSTERECTOMY     BSO  . APPENDECTOMY    . CESAREAN SECTION     x 2  . CHOLECYSTECTOMY    . CHONDROPLASTY Left 01/06/2015   Procedure: CHONDROPLASTY;  Surgeon: Marybelle Killings, MD;  Location: [facility];  Service: Orthopedics;  Laterality: Left;  . COLONOSCOPY    . ELBOW FRACTURE SURGERY     right  . KNEE ARTHROSCOPY WITH EXCISION PLICA Left 4/85/4627   Procedure: KNEE ARTHROSCOPY WITH EXCISION PLICA;  Surgeon: Marybelle Killings, MD;  Location: [facility];  Service: Orthopedics;  Laterality: Left;  . Lower Extremity Arterial Dopplers  07/06/2013   RABI 1.0, LABI 1.1.; No evidence of significant vascular atherogenic plaque  . NECK SURGERY    . NM MYOVIEW LTD  09/2014   LOW RISK. Normal EF of 65% with no regional wall motion abnormalities. No ischemia or infarction.  . TONSILLECTOMY    . WRIST FRACTURE SURGERY     left, has plate   Social History   Socioeconomic History  . Marital status: Married    Spouse name: [name]  . Number of children:  2  . Years of education: HS  . Highest education level: None  Social Needs  . Financial resource strain: None  . Food insecurity - worry: None  . Food insecurity - inability: None  . Transportation needs - medical: None  . Transportation needs - non-medical: None  Occupational History  . Occupation: disable  Tobacco Use  . Smoking status: Former Smoker    Packs/day: 1.00    Years: 39.00    Pack years: 39.00    Types: Cigarettes    Start date: 08/06/1977  . Smokeless tobacco: Never Used  . Tobacco comment: stopped 10/22.   Substance and Sexual Activity  . Alcohol use: No    Alcohol/week: 0.0 oz  . Drug use: No  . Sexual activity: No  Other Topics Concern  . None  Social History Narrative   Patient lives at home spouse [name].  Has 2  daughters (89 & 54).   Currently "disabled".   Caffeine Use: tea 4 glasses daily.    Smokes ~1 1/2 PPD  05/15/15   Walks everyday - no set routine.      [facility] (12/03/16):   Originally from [facility]. 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 Currently has a dog. No bird exposure. No known mold exposure.    Allergies  Allergen Reactions  . Bee Venom Swelling  . Codeine Nausea Only and Other (See Comments)    CAUSES ULCERS  . Ibuprofen Nausea And Vomiting  . Hydrocodone-Acetaminophen Nausea Only  . Latex Rash  . Levofloxacin Nausea Only  . Propoxyphene N-Acetaminophen Nausea Only   Family History  Problem Relation Age of Onset  . Emphysema Father   . Dementia Mother   . Diabetes Maternal Grandmother   . Diabetes Maternal Aunt   . Diabetes Unknown        mat. cousin  . Heart disease Brother   . Colon cancer Neg Hx   . Allergic rhinitis Neg Hx   . Angioedema Neg Hx   . Asthma Neg Hx   . Atopy Neg Hx   . Eczema Neg Hx   . Immunodeficiency Neg Hx   . Urticaria Neg Hx      Past medical history, social, surgical and family history all reviewed in electronic medical record.  No pertanent information unless stated regarding to the chief complaint.   Review of Systems:Review of systems updated and as accurate as of 08/15/17  No , visual changes, nausea, vomiting, diarrhea, constipation, dizziness, abdominal pain, skin rash, fevers, chills, night sweats, weight loss, swollen lymph nodes,  chest pain, shortness of breath, mood changes.  Positive headaches, muscle aches, body aches  Objective  Blood pressure 120/82, pulse 85, height 5\' 4"  (1.626 m), weight 143 lb (64.9 kg), SpO2 98 %. Systems examined below as of 08/15/17   General: No apparent distress alert and oriented x3 mood and affect normal, dressed appropriately.  HEENT: Pupils equal, extraocular movements intact  Respiratory: Patient's speak in full sentences and does not appear short of breath    Cardiovascular: No lower extremity edema, non tender, no erythema  Skin: Warm dry intact with no signs of infection or rash on extremities or on axial skeleton.  Abdomen: Soft nontender  Neuro: Cranial nerves II through XII are intact, neurovascularly intact in all extremities with 2+ DTRs and 2+ pulses.  Lymph: No lymphadenopathy of posterior or anterior cervical chain or axillae bilaterally.  Gait antalgic gait MSK:  Non tender with full range of motion and  good stability and symmetric strength and tone of shoulders, elbows, wrist, hip, knee and ankles bilaterally.  Arthritic changes of multiple joints Back exam shows the patient does not want to do any type of flexion greater than 5 degrees.  Patient also fairly noncompliant with me attempted of extension.  Mild positive straight leg test noted on the right side of the L5 radicular symptoms.  Seems to be positive at 20 degrees.  No significant weakness though noted at this time.  Patient though is even tender to palpation in the calf and down the leg and severely over the paraspinal musculature lumbar spine on the right.    Impression and Recommendations:     This case required medical decision making of moderate complexity.      Note: This dictation was prepared with Dragon dictation along with smaller phrase technology. Any transcriptional errors that result from this process are unintentional.

## 2017-08-16 ENCOUNTER — Emergency Department (HOSPITAL_COMMUNITY)
Admission: EM | Admit: 2017-08-16 | Discharge: 2017-08-16 | Disposition: A | Discharge status: Home / Self Care | Payer: Medicare Other | Attending: Emergency Medicine | Admitting: Emergency Medicine

## 2017-08-16 DIAGNOSIS — R0789 Other chest pain: Secondary | ICD-10-CM | POA: Diagnosis not present

## 2017-08-16 LAB — I-STAT TROPONIN, ED: Troponin i, poc: 0.01 ng/mL (ref 0.00–0.08)

## 2017-08-16 MED ORDER — FAMOTIDINE 20 MG PO TABS: 20.0000 mg | ORAL_TABLET | Freq: Two times a day (BID) | ORAL | 0 refills | Status: DC

## 2017-08-16 MED ORDER — SUCRALFATE 1 GM/10ML PO SUSP
1.0000 g | Freq: Three times a day (TID) | ORAL | 0 refills | Status: DC
Start: 2017-08-16 — End: 2017-08-19

## 2017-08-16 MED ORDER — GI COCKTAIL ~~LOC~~
30.0000 mL | Freq: Once | ORAL | Status: AC
  Administered 2017-08-16: 30 mL via ORAL
  Filled 2017-08-16: qty 30

## 2017-08-16 NOTE — ED Notes (Signed)
ED Provider at bedside. 

## 2017-08-16 NOTE — ED Provider Notes (Signed)
[facility] EMERGENCY DEPARTMENT Provider Note   CSN: [id number] Arrival date & time: 08/15/17  2205     History   Chief Complaint Chief Complaint  Patient presents with  . Chest Pain  . Shortness of Breath   HPI   Blood pressure (!) 108/58, pulse 81, temperature 97.6 F (36.4 C), temperature source Oral, resp. rate 20, height 5\' 4"  (1.626 m), weight 64.9 kg (143 lb), SpO2 98 %.  Lori [name] is a 61 y.o. female complaining of retrosternal chest pain onset last night at 7 PM with no associated diaphoresis but she does have associated nausea and left arm tingling. Pain is described as tightness, rated at 6 out of 10 and constant since 7 PM this is typical for prior chest pain exacerbations. She denies any increasing peripheral edema, orthopnea, PND, history of DVT/, recent immobilizations, calf pain, leg swelling, saturation is exertion, pleuritic exacerbation, hemoptysis.  Past Medical History:  Diagnosis Date  . Acute MI ([place])    x3 - by report only. She has had 3 negative Myoview stress test with no evidence of prior infarct.  . Allergic rhinitis   . Allergy   . Anemia   . Ankle fracture 05/2016  . Anxiety   . Arthritis   . Asthma   . Barrett esophagus 2007  . Chest pain 05-02-2009   echo  EF 55%  . COPD (chronic obstructive pulmonary disease) with chronic bronchitis (HCC)    & Emphysema  . Depression   . Duodenitis without mention of hemorrhage 2007  . Esophageal reflux 2007  . Esophageal stricture   . Full dentures   . H/O hiatal hernia   . Hiatal hernia [id number]  . Hyperlipidemia   . Multiple fractures    from falls, fx rt. elbow, fx left wrist, bilateral ankles  . Pneumonia 10/2016  . Restrictive lung disease 06/30/2017  . Ulcer     Patient Active Problem List   Diagnosis Date Noted  . Lumbar radiculitis 08/15/2017  . Degenerative disc disease, lumbar 07/21/2017  . Encounter for well adult exam with abnormal findings 06/30/2017  .  Epigastric pain 06/30/2017  . Restrictive lung disease 06/30/2017  . Leg cramping 06/09/2017  . Elbow contusion 06/09/2017  . Right elbow pain 05/27/2017  . COPD mixed type ([place]) 01/31/2017  . Dizziness 01/20/2017  . Perennial allergic rhinitis 01/01/2017  . Moderate persistent asthma, uncomplicated 85/46/2703  . Pulmonary emphysema ([place]) 12/03/2016  . Tobacco use disorder 12/03/2016  . Chronic seasonal allergic rhinitis 12/03/2016  . Medication management 11/29/2016  . Cough 11/23/2016  . Rash 11/23/2016  . Vertigo 10/22/2016  . Gastroesophageal reflux disease with esophagitis 10/22/2016  . Non compliance w medication regimen 08/01/2016  . Allergic contact dermatitis due to metals 08/01/2016  . Migraine without aura and without status migrainosus, not intractable 07/22/2016  . Spasm of muscle of lower back 06/21/2016  . Chronic rhinitis 06/19/2016  . Gait difficulty 06/21/2015  . Fibromyalgia 06/21/2015  . Conversion reaction 06/21/2015  . Depression 06/21/2015  . Syncope and collapse 06/21/2015  . Plica syndrome of left knee 01/06/2015  . Anxiety state 09/06/2014  . History of MI 09/06/2014  . Borderline hypertension 06/27/2013  . Leg pain, bilateral 06/14/2013  . Gastroparesis 10/26/2009  . Musculoskeletal chest pain 05/02/2009  . Hyperlipidemia LDL goal <100 11/18/2008  . HEPATIC CYST 11/18/2008    Past Surgical History:  Procedure Laterality Date  . ABDOMINAL HYSTERECTOMY     BSO  . APPENDECTOMY    .  CESAREAN SECTION     x 2  . CHOLECYSTECTOMY    . CHONDROPLASTY Left 01/06/2015   Procedure: CHONDROPLASTY;  Surgeon: Marybelle Killings, MD;  Location: [facility];  Service: Orthopedics;  Laterality: Left;  . COLONOSCOPY    . ELBOW FRACTURE SURGERY     right  . KNEE ARTHROSCOPY WITH EXCISION PLICA Left 3/66/4403   Procedure: KNEE ARTHROSCOPY WITH EXCISION PLICA;  Surgeon: Marybelle Killings, MD;  Location: [facility];  Service: Orthopedics;   Laterality: Left;  . Lower Extremity Arterial Dopplers  07/06/2013   RABI 1.0, LABI 1.1.; No evidence of significant vascular atherogenic plaque  . NECK SURGERY    . NM MYOVIEW LTD  09/2014   LOW RISK. Normal EF of 65% with no regional wall motion abnormalities. No ischemia or infarction.  . TONSILLECTOMY    . WRIST FRACTURE SURGERY     left, has plate    OB History    No data available       Home Medications    Prior to Admission medications   Medication Sig Start Date End Date Taking? Authorizing Provider  aspirin 81 MG EC tablet Take 1 tablet (81 mg total) by mouth daily. Swallow whole. 06/30/17  Yes Biagio Borg, MD  cyclobenzaprine (FLEXERIL) 5 MG tablet Take 5 mg by mouth daily as needed for muscle spasms. 06/11/17  Yes [provider]  DEXILANT 60 MG capsule Take 60 mg by mouth daily. 07/24/17  Yes [provider]  gabapentin (NEURONTIN) 100 MG capsule Take 2 capsules (200 mg total) by mouth at bedtime. 08/15/17  Yes Hulan Saas M, DO  montelukast (SINGULAIR) 10 MG tablet Take 1 tablet (10 mg total) by mouth at bedtime. 05/16/17  Yes Padgett, Rae Halsted, MD  nitroGLYCERIN (NITROSTAT) 0.3 MG SL tablet Place 0.3 mg under the tongue every 5 (five) minutes as needed for chest pain.   Yes [provider]  PROAIR HFA 108 (90 Base) MCG/ACT inhaler Take 1-2 puffs by mouth every 4 (four) hours as needed for shortness of breath or wheezing. 07/08/17  Yes [provider]  famotidine (PEPCID) 20 MG tablet Take 1 tablet (20 mg total) by mouth 2 (two) times daily. 08/16/17   Karyme Mcconathy, Elmyra Ricks, PA-C  meclizine (ANTIVERT) 12.5 MG tablet Take 1 tablet (12.5 mg total) by mouth 3 (three) times daily as needed for dizziness. Patient not taking: Reported on 08/16/2017 06/30/17 06/30/18  Biagio Borg, MD  nicotine (NICODERM CQ - DOSED IN MG/24 HOURS) 14 mg/24hr patch Place 1 patch (14 mg total) onto the skin daily. Patient not taking: Reported on 08/16/2017  07/15/17   Javier Glazier, MD  ondansetron [facility] ODT) 4 MG disintegrating tablet 4mg  ODT q4 hours prn nausea/vomit Patient not taking: Reported on 08/16/2017 07/18/17   Deno Etienne, DO  promethazine (PHENERGAN) 25 MG suppository Place 1 suppository (25 mg total) rectally every 6 (six) hours as needed for nausea or vomiting. Patient not taking: Reported on 08/16/2017 07/18/17   Deno Etienne, DO  rosuvastatin (CRESTOR) 20 MG tablet Take 1 tablet (20 mg total) by mouth daily. Patient not taking: Reported on 08/16/2017 06/30/17   Biagio Borg, MD  sucralfate (CARAFATE) 1 GM/10ML suspension Take 10 mLs (1 g total) by mouth 4 (four) times daily -  with meals and at bedtime. 08/16/17   Ernesto Lashway, Elmyra Ricks, PA-C  traMADol (ULTRAM) 50 MG tablet Take 1 tablet (50 mg total) by mouth every 6 (six) hours as  needed. Patient not taking: Reported on 08/16/2017 07/09/17   Davonna Belling, MD  valACYclovir (VALTREX) 500 MG tablet TAKE 1 TABLET(500 MG) BY MOUTH DAILY Patient not taking: Reported on 08/16/2017 07/25/17   Biagio Borg, MD    Family History Family History  Problem Relation Age of Onset  . Emphysema Father   . Dementia Mother   . Diabetes Maternal Grandmother   . Diabetes Maternal Aunt   . Diabetes Unknown        mat. cousin  . Heart disease Brother   . Colon cancer Neg Hx   . Allergic rhinitis Neg Hx   . Angioedema Neg Hx   . Asthma Neg Hx   . Atopy Neg Hx   . Eczema Neg Hx   . Immunodeficiency Neg Hx   . Urticaria Neg Hx     Social History Social History   Tobacco Use  . Smoking status: Former Smoker    Packs/day: 1.00    Years: 39.00    Pack years: 39.00    Types: Cigarettes    Start date: 08/06/1977  . Smokeless tobacco: Never Used  . Tobacco comment: stopped 10/22.   Substance Use Topics  . Alcohol use: No    Alcohol/week: 0.0 oz  . Drug use: No     Allergies   Bee venom; Codeine; Ibuprofen; Hydrocodone-acetaminophen; Latex; Levofloxacin; and Propoxyphene  n-acetaminophen   Review of Systems Review of Systems  A complete review of systems was obtained and all systems are negative except as noted in the HPI and PMH.   Physical Exam Updated Vital Signs BP (!) 124/52   Pulse 68   Temp 97.6 F (36.4 C) (Oral)   Resp 18   Ht 5\' 4"  (1.626 m)   Wt 64.9 kg (143 lb)   SpO2 98%   BMI 24.55 kg/m   Physical Exam  Constitutional: She is oriented to person, place, and time. She appears well-developed and well-nourished. No distress.  HENT:  Head: Normocephalic and atraumatic.  Mouth/Throat: Oropharynx is clear and moist.  Eyes: Conjunctivae and EOM are normal. Pupils are equal, round, and reactive to light.  Neck: Normal range of motion. No JVD present. No tracheal deviation present.  Cardiovascular: Normal rate, regular rhythm and intact distal pulses.  Radial pulse equal bilaterally  Pulmonary/Chest: Effort normal and breath sounds normal. No stridor. No respiratory distress. She has no wheezes. She has no rales. She exhibits no tenderness.  Abdominal: Soft. She exhibits no distension and no mass. There is no tenderness. There is no rebound and no guarding.  Musculoskeletal: Normal range of motion. She exhibits no edema or tenderness.  No calf asymmetry, superficial collaterals, palpable cords, edema, Homans sign negative bilaterally.    Neurological: She is alert and oriented to person, place, and time.  Skin: Skin is warm. She is not diaphoretic.  Psychiatric: She has a normal mood and affect.  Nursing note and vitals reviewed.    ED Treatments / Results  Labs (all labs ordered are listed, but only abnormal results are displayed) Labs Reviewed  BASIC METABOLIC PANEL - Abnormal; Notable for the following components:      Result Value   Sodium 134 (*)    CO2 21 (*)    Glucose, Bld 108 (*)    All other components within normal limits  CBC - Abnormal; Notable for the following components:   WBC 16.7 (*)    Hemoglobin 11.7 (*)      RDW 15.8 (*)  All other components within normal limits  I-STAT TROPONIN, ED  I-STAT TROPONIN, ED    EKG  EKG Interpretation  Date/Time:  Friday August 15 2017 22:07:41 EST Ventricular Rate:  76 PR Interval:  102 QRS Duration: 80 QT Interval:  370 QTC Calculation: 416 R Axis:   75 Text Interpretation:  Sinus rhythm with short PR Otherwise normal ECG tachycardia has resolved, otherwise similar to previous Confirmed by Theotis Burrow [phone]) on 08/16/2017 7:18:48 AM Also confirmed by Theotis Burrow [phone]), editor Laurena Spies [phone])  on 08/16/2017 9:12:33 AM       Radiology Dg Chest 2 View  Result Date: 08/15/2017 CLINICAL DATA:  Chest pain.  Shortness of breath. EXAM: CHEST  2 VIEW COMPARISON:  July 18, 2017 and June 24, 2017 FINDINGS: A prominent right epicardial fat pad is stable. The heart, hila, mediastinum, lungs, and pleura are otherwise unchanged. Pleuroparenchymal thickening in the apices, left greater than right, persist and is stable. No acute interval change. IMPRESSION: No acute abnormalities. Electronically Signed   By: Dorise Bullion III M.D   On: 08/15/2017 22:45    Procedures Procedures (including critical care time)  Medications Ordered in ED Medications  gi cocktail (Maalox,Lidocaine,Donnatal) (30 mLs Oral Given 08/16/17 0710)     Initial Impression / Assessment and Plan / ED Course  I have reviewed the triage vital signs and the nursing notes.  Pertinent labs & imaging results that were available during my care of the patient were reviewed by me and considered in my medical decision making (see chart for details).     Vitals:   08/15/17 2210 08/16/17 0643 08/16/17 0644 08/16/17 0645  BP: (!) 108/58 (!) 124/52 (!) 124/52   Pulse: 81  63 68  Resp: 20  19 18   Temp: 97.6 F (36.4 C)     TempSrc: Oral     SpO2: 98%  97% 98%  Weight:      Height:        Medications  gi cocktail (Maalox,Lidocaine,Donnatal) (30 mLs Oral Given  08/16/17 0710)    Lori [name] is 61 y.o. female presenting with chest pain exacerbation of typical for prior episodes. Patient has had multiple cardiac evaluations including echo and catheter within normal. She states that this is typical for prior. EKG nonischemic, troponin negative, blood work reassuring. Doubt ACS. No signs or symptoms consistent with DVT/PE. Patient was given nitroglycerin and aspirin and this was not helpful to her.   The pain has completely resolved with GI cocktail.  Second troponin negative.  Likely reflux. Will follow with PCP.   This is a shared visit with the attending physician who personally evaluated the patient and agrees with the care plan.   Evaluation does not show pathology that would require ongoing emergent intervention or inpatient treatment. Pt is hemodynamically stable and mentating appropriately. Discussed findings and plan with patient/guardian, who agrees with care plan. All questions answered. Return precautions discussed and outpatient follow up given.     Final Clinical Impressions(s) / ED Diagnoses   Final diagnoses:  Atypical chest pain    ED Discharge Orders        Ordered    famotidine (PEPCID) 20 MG tablet  2 times daily     08/16/17 0926    sucralfate (CARAFATE) 1 GM/10ML suspension  3 times daily with meals & bedtime     08/16/17 0926       Waynetta Pean 08/16/17 2831    Ward, Delice Bison, DO 08/20/17  0126  

## 2017-08-16 NOTE — Discharge Instructions (Signed)
Please follow with your primary care doctor in the next 2 days for a check-up. They must obtain records for further management.  ° °Do not hesitate to return to the Emergency Department for any new, worsening or concerning symptoms.  ° °

## 2017-08-18 ENCOUNTER — Ambulatory Visit (INDEPENDENT_AMBULATORY_CARE_PROVIDER_SITE_OTHER): Payer: Medicare Other | Admitting: Allergy & Immunology

## 2017-08-18 ENCOUNTER — Encounter: Payer: Self-pay | Admitting: Allergy & Immunology

## 2017-08-18 ENCOUNTER — Other Ambulatory Visit: Payer: Self-pay | Admitting: *Deleted

## 2017-08-18 VITALS — BP 126/86 | HR 76 | Resp 20

## 2017-08-18 DIAGNOSIS — J302 Other seasonal allergic rhinitis: Secondary | ICD-10-CM

## 2017-08-18 DIAGNOSIS — Z9119 Patient's noncompliance with other medical treatment and regimen: Secondary | ICD-10-CM

## 2017-08-18 DIAGNOSIS — F17210 Nicotine dependence, cigarettes, uncomplicated: Secondary | ICD-10-CM | POA: Diagnosis not present

## 2017-08-18 DIAGNOSIS — J3089 Other allergic rhinitis: Secondary | ICD-10-CM

## 2017-08-18 DIAGNOSIS — J4540 Moderate persistent asthma, uncomplicated: Secondary | ICD-10-CM

## 2017-08-18 DIAGNOSIS — Z91199 Patient's noncompliance with other medical treatment and regimen due to unspecified reason: Secondary | ICD-10-CM

## 2017-08-18 NOTE — Patient Instructions (Addendum)
1. Moderate persistent asthma, uncomplicated - Lung testing looked good today.  - Since you are doing so well, we will remove your daily inhaler - If you are using ProAir more than once per week, we will have to add it back onto your medication list again.  - Daily controller medication(s): Singulair (montelukast) 10mg  daily - Rescue medications: ProAir 4 puffs every 4-6 hours as needed or albuterol nebulizer one vial puffs every 4-6 hours as needed - Asthma control goals:  * Full participation in all desired activities (may need albuterol before activity) * Albuterol use two time or less a week on average (not counting use with activity) * Cough interfering with sleep two time or less a month * Oral steroids no more than once a year * No hospitalizations - Talk to Dr. Jenny Reichmann about starting Chantix (the pill to help you stop smoking).   2. Perennial allergic rhinitis - We will get some blood work to see if you need more allergens added to your shots. - In the meantime, restart Nasacort 2 sprays per nostril daily. - Continue with Singulair 10mg  daily. - Continue with cetirizine (Zyrtec) 10mg  daily to help with the sneezing and other allergy symptoms.  - Continue allergy shots at the same schedule for now, but we might increase frequency to every two weeks.   3. Return in about 3 months (around 11/16/2017).   Please inform us of any Emergency Department visits, hospitalizations, or changes in symptoms. Call us before going to the ED for breathing or allergy symptoms since we might be able to fit you in for a sick visit. Feel free to contact us anytime with any questions, problems, or concerns.  It was a pleasure to see you and your family again today! Enjoy the holiday season!  Websites that have reliable patient information: 1. American Academy of Asthma, Allergy, and Immunology: www.aaaai.org 2. Food Allergy Research and Education (FARE): foodallergy.org 3. Mothers of Asthmatics:  http://www.asthmacommunitynetwork.org 4. American College of Allergy, Asthma, and Immunology: www.acaai.org

## 2017-08-18 NOTE — Progress Notes (Signed)
FOLLOW UP  Date of Service/Encounter:  08/18/17   Assessment:   Seasonal and perennial allergic rhinitis - on allergen immunotherapy but with decreased efficacy as of late  Cigarette smoker  Moderate persistent asthma, uncomplicated  Compliance poor   Asthma Reportables:  Severity: moderate persistent  Risk: high Control: not well controlled   Plan/Recommendations:   1. Moderate persistent asthma, uncomplicated - Lung testing looked good today.  - Since you are doing so well, we will remove your daily inhaler - If you are using ProAir more than once per week, we will have to add it back onto your medication list again.   - Daily controller medication(s): Singulair (montelukast) 10mg  daily - Rescue medications: ProAir 4 puffs every 4-6 hours as needed or albuterol nebulizer one vial puffs every 4-6 hours as needed - Asthma control goals:  * Full participation in all desired activities (may need albuterol before activity) * Albuterol use two time or less a week on average (not counting use with activity) * Cough interfering with sleep two time or less a month * Oral steroids no more than once a year * No hospitalizations - Talk to Dr. Jenny Reichmann about starting Chantix.  2. Perennial allergic rhinitis - We will get some blood work to see if you need more allergens added to your shots. - In the meantime, restart Nasacort 2 sprays per nostril daily. - Continue with Singulair 10mg  daily. - Continue with cetirizine (Zyrtec) 10mg  daily to help with the sneezing and other allergy symptoms.  - Continue allergy shots at the same schedule for now, but we might increase frequency to every two weeks.   3. Return in about 3 months (around 11/16/2017).  Subjective:   Lori [name] is a 61 y.o. female presenting today for follow up of  Chief Complaint  Patient presents with  . Asthma    ANELIESE BEAUDRY has a history of the following: Patient Active Problem List   Diagnosis Date  Noted  . Lumbar radiculitis 08/15/2017  . Degenerative disc disease, lumbar 07/21/2017  . Encounter for well adult exam with abnormal findings 06/30/2017  . Epigastric pain 06/30/2017  . Restrictive lung disease 06/30/2017  . Leg cramping 06/09/2017  . Elbow contusion 06/09/2017  . Right elbow pain 05/27/2017  . COPD mixed type ([place]) 01/31/2017  . Dizziness 01/20/2017  . Seasonal and perennial allergic rhinitis 01/01/2017  . Moderate persistent asthma, uncomplicated 33/82/5053  . Pulmonary emphysema ([place]) 12/03/2016  . Tobacco use disorder 12/03/2016  . Chronic seasonal allergic rhinitis 12/03/2016  . Medication management 11/29/2016  . Cough 11/23/2016  . Rash 11/23/2016  . Vertigo 10/22/2016  . Gastroesophageal reflux disease with esophagitis 10/22/2016  . Non compliance w medication regimen 08/01/2016  . Allergic contact dermatitis due to metals 08/01/2016  . Migraine without aura and without status migrainosus, not intractable 07/22/2016  . Spasm of muscle of lower back 06/21/2016  . Chronic rhinitis 06/19/2016  . Gait difficulty 06/21/2015  . Fibromyalgia 06/21/2015  . Conversion reaction 06/21/2015  . Depression 06/21/2015  . Syncope and collapse 06/21/2015  . Plica syndrome of left knee 01/06/2015  . Anxiety state 09/06/2014  . History of MI 09/06/2014  . Cigarette smoker 06/27/2013  . Borderline hypertension 06/27/2013  . Leg pain, bilateral 06/14/2013  . Gastroparesis 10/26/2009  . Musculoskeletal chest pain 05/02/2009  . Hyperlipidemia LDL goal <100 11/18/2008  . HEPATIC CYST 11/18/2008    History obtained from: chart review and patient and her husband and friend.  Lori [name]'s Primary Care Provider is Biagio Borg, MD.     Lori [name] is a 61 y.o. female presenting for a follow up visit. She was last seen in August 2018 by Dr. Nelva Bush for a sick visit. At that time, she was complaining about chronic mucous production for four weeks. She was placed on  Augmentin as well as prednisone. Compliance with her Memory Dance was also emphasized. She was continued on Nasacort two sprays per nostril daily, montelukast 10mg  daily, and cetirizine 10mg  daily. She was also continued on her allergy shots (currently at maintenance mL of the Red Vial of each injection). At the last visit, she reported that she had quit smoking.   Since the last visit, she has mostly done well. She is actually not taking any of inhaled medications. As of the last visit, she was supposed to be on Breo 200/25 one puff daily. She has never been very complaint with any of her medications. Since stopping her controllers, she reports needing her rescue medication infrequently - approximately once per week at the most. She denies daytime and night time symptoms. She has not needed prednisone in quite some time, at least according to the patient. Unfortunately, her foray into not smoking lasted only one week. She is interested in Chantix, but we have given her one month in the past, with intentions for her to get more from her PCP; unfortunately, she never picked this up from the pharmacy.   She remains on immunotherapy, which she has been on for quite some time; she was already on maintenance when I took over her care. Lori [name] is on allergen immunotherapy. She receives two injections. Immunotherapy script #1 contains molds. She currently receives 0.23mL of the RED vial (1/100). Immunotherapy script #2 contains cat and dog. She currently receives 0.66mL of the RED vial (1/100). However, she reports today that she does not "feel like they are strong enough". When asked to clarify, she says "they just don't feel strong enough when they go in". I ask whether she is having worsening congestion, and she affirms this, reporting "worsening sinuses". She has not been using any of her nasal sprays, however, which is frankly not a surprise given her history of non-compliance.   Lori [name] has been on the ED multiple  times since the last visit, but not for breathing problems. She was seen in the ED for heart attack like symptoms over the weekend; this workup was negative for MI. She was seen in the ED at the beginning of November with nausea and vomiting. In October, she was seen in the ED for facial pain, with a negative head and neck CT; symptoms were attributed to possible trigeminal neuralgia.   Otherwise, there have been no changes to her past medical history, surgical history, family history, or social history.    Review of Systems: a 14-point review of systems is pertinent for what is mentioned in HPI.  Otherwise, all other systems were negative. Constitutional: negative other than that listed in the HPI Eyes: negative other than that listed in the HPI Ears, nose, mouth, throat, and face: negative other than that listed in the HPI Respiratory: negative other than that listed in the HPI Cardiovascular: negative other than that listed in the HPI Gastrointestinal: positive for weight gain, negative other than that listed in the HPI Genitourinary: negative other than that listed in the HPI Integument: negative other than that listed in the HPI Hematologic: negative other than that listed in the HPI Musculoskeletal:  negative other than that listed in the HPI Neurological: negative other than that listed in the HPI Allergy/Immunologic: negative other than that listed in the HPI    Objective:   Blood pressure 126/86, pulse 76, resp. rate 20. There is no height or weight on file to calculate BMI.   Physical Exam:  General: Alert, interactive, in no acute distress. Cushingoid like facies. Very talkative as always.  Eyes: No conjunctival injection bilaterally, no discharge on the right, no discharge on the left and no Horner-Trantas dots present. PERRL bilaterally. EOMI without pain. No photophobia.  Ears: Right TM pearly gray with normal light reflex, Left TM pearly gray with normal light reflex,  Right TM intact without perforation and Left TM intact without perforation.  Nose/Throat: External nose within normal limits, nasal crease present and septum midline. Turbinates edematous with thick discharge. Posterior oropharynx mildly erythematous without cobblestoning in the posterior oropharynx. Tonsils 2+ without exudates.  Tongue without thrush. Adenopathy: no enlarged lymph nodes appreciated in the anterior cervical, occipital, axillary, epitrochlear, inguinal, or popliteal regions. Lungs: Decreased breath sounds bilaterally without wheezing, rhonchi or rales. No increased work of breathing. CV: Normal S1/S2. No murmurs. Capillary refill <2 seconds.  Skin: Warm and dry, without lesions or rashes. Neuro:   Grossly intact. No focal deficits appreciated. Responsive to questions.  Diagnostic studies:   Spirometry: results normal (FEV1: 1.84/79%, FVC: 2.43/80%, FEV1/FVC: 76%).    Spirometry consistent with normal pattern.   Allergy Studies: none      Salvatore Marvel, MD [place] [facility] [place]

## 2017-08-18 NOTE — Patient Outreach (Signed)
[facility]) Care Management  08/18/2017  JAVIANA ANWAR [birth year] [id number]  Telephone Screen  Referral Date:  08/18/2017 Referral Source:  [facility] ED Census Reason for Referral:  6 or more ED visits in the past 6 months Insurance:  [employer]   Outreach Attempt:  Successful telephone outreach to patient.  HIPAA verified.  Telephone screening completed.  Social:  Patient states she lives at home with her husband.  States she currently needs assistance with her ADLs (bathing and dressing) and IADLs (cooking and cleaning) and has no available caregiver.  Reports husband is not in the home much and she has limited communication with her children and family.  Reports no DME in the home.  Patient does state she has life alert available in the home.  States she ambulates independently, but is having a difficult time getting around due to pack pain and pinched lumbar nerve.  Patient reports she does have a friend whom takes her to her medical appointments.  Conditions:  Per chart review and discussion with patient, PMH include: COPD, emphysema, asthma, recently quite smoking 07/07/2017, allergies, anxiety, depression, arthritis, esophogeal stricture, hyperlipidemia, GERD.  Reports multiple ED visits in the last 3 months.  Recent visit 08/16/2017 experiencing chest pain, relieved with GI cocktail.  Patient has been seeing primary care provider for severe back pain and was diagnosed with pinched nerve in the lumbar region.  Underwent spinal injection with little pain relief.  Patient awaiting to see neurosurgeon in referral for possible surgery or suggestions.  Patient reports having dizzy spells, but denies having any falls.  Does report having a hard time getting around the home.  Medications: Patient reports taking about 15 medications.  Currently has a prescription to treat her irritable bowel that she can not afford and is attempting to discuss with primary care  provider to change medication to something covered by her insurance.  Patient reports she manages medications and compliance with her daily medications.  Appointments:  Last seen primary care provider on 08/15/2017 and has appointment with Dr. Jenny Reichmann on 12/30/2017.  Sees Dr. Ernst Bowler (allergy specialist).  Patient see Dr. Vaughan Browner, pulmonologist and has appointment on 10/15/2016.  Patient follows up with gastroenterologist on 08/19/2017.  Advanced Directives:  Denies having an advance directive in place.   Consent:  [facility] services reviewed and discussed with patient.  Patient verbally agrees to [facility] services.  Plan: RN Health Coach placed [place] RN Referral for home safety evaluation and further complex care coordination. RN Health Coach placed [place] referral for medication reconciliation and possible medication assistance. RN Health Coach notified [facility] administrative assistance of case status. RN Health Coach to send patient successful outreach letter.   [place] [phone] [email or web address]

## 2017-08-19 ENCOUNTER — Ambulatory Visit (INDEPENDENT_AMBULATORY_CARE_PROVIDER_SITE_OTHER): Payer: Medicare Other | Admitting: Nurse Practitioner

## 2017-08-19 ENCOUNTER — Telehealth: Payer: Self-pay | Admitting: Nurse Practitioner

## 2017-08-19 ENCOUNTER — Other Ambulatory Visit: Payer: Self-pay | Admitting: Allergy & Immunology

## 2017-08-19 ENCOUNTER — Other Ambulatory Visit (INDEPENDENT_AMBULATORY_CARE_PROVIDER_SITE_OTHER): Payer: Medicare Other

## 2017-08-19 ENCOUNTER — Encounter: Payer: Self-pay | Admitting: Nurse Practitioner

## 2017-08-19 VITALS — BP 120/80 | HR 78 | Ht 64.0 in | Wt 142.8 lb

## 2017-08-19 DIAGNOSIS — D72829 Elevated white blood cell count, unspecified: Secondary | ICD-10-CM | POA: Diagnosis not present

## 2017-08-19 DIAGNOSIS — J3089 Other allergic rhinitis: Secondary | ICD-10-CM | POA: Diagnosis not present

## 2017-08-19 DIAGNOSIS — K219 Gastro-esophageal reflux disease without esophagitis: Secondary | ICD-10-CM

## 2017-08-19 DIAGNOSIS — R0789 Other chest pain: Secondary | ICD-10-CM | POA: Diagnosis not present

## 2017-08-19 DIAGNOSIS — J302 Other seasonal allergic rhinitis: Secondary | ICD-10-CM | POA: Diagnosis not present

## 2017-08-19 LAB — CBC WITH DIFFERENTIAL/PLATELET
BASOS ABS: 0 10*3/uL (ref 0.0–0.1)
Basophils Relative: 0.2 % (ref 0.0–3.0)
Eosinophils Absolute: 0.2 10*3/uL (ref 0.0–0.7)
Eosinophils Relative: 1.7 % (ref 0.0–5.0)
HCT: 38 % (ref 36.0–46.0)
Hemoglobin: 12.3 g/dL (ref 12.0–15.0)
LYMPHS ABS: 4.8 10*3/uL — AB (ref 0.7–4.0)
Lymphocytes Relative: 36 % (ref 12.0–46.0)
MCHC: 32.5 g/dL (ref 30.0–36.0)
MCV: 80.6 fl (ref 78.0–100.0)
MONO ABS: 0.9 10*3/uL (ref 0.1–1.0)
MONOS PCT: 6.9 % (ref 3.0–12.0)
NEUTROS ABS: 7.3 10*3/uL (ref 1.4–7.7)
NEUTROS PCT: 55.2 % (ref 43.0–77.0)
PLATELETS: 366 10*3/uL (ref 150.0–400.0)
RBC: 4.71 Mil/uL (ref 3.87–5.11)
RDW: 15.7 % — ABNORMAL HIGH (ref 11.5–15.5)
WBC: 13.2 10*3/uL — ABNORMAL HIGH (ref 4.0–10.5)

## 2017-08-19 MED ORDER — NONFORMULARY OR COMPOUNDED ITEM: 0 refills | Status: DC

## 2017-08-19 NOTE — Patient Instructions (Signed)
If you are age 61 or older, your body mass index should be between 23-30. Your Body mass index is 24.51 kg/m. If this is out of the aforementioned range listed, please consider follow up with your Primary Care Provider.  If you are age 75 or younger, your body mass index should be between 19-25. Your Body mass index is 24.51 kg/m. If this is out of the aformentioned range listed, please consider follow up with your Primary Care Provider.   Your physician has requested that you go to the basement for the following lab work before leaving today: CBC with Diff We have sent the following medications to your pharmacy for you to pick up at your convenience: GI Cocktail Decrease Pepcid to daily - at bedtime. STOP Carafate. Use wedge pillow.  You have been given GERD literature.  Follow up as needed.  Thank you for choosing me and [facility].   Tye Savoy, NP

## 2017-08-19 NOTE — Progress Notes (Signed)
       Chief Complaint:  heartburn  HPI: Patient is a 61 year old female known previously to Dr. Sharlett [name], now to Dr. Loletha [name].  She has long-standing chronic pain syndrome and is narcotic dependent.  She has a, history of GERD, adenomatous colon polyps, CAD, and COPD.  She has been to the emergency department numerous times over the years . Patient has been having severe heartburn over the last few months.  Heartburn is so severe it leads to vomiting. In ED early Nov with vomiting and epigastric / chest pain. GI cocktail helped. Back to ED on 12/1 with chest pain and left arm tingling.  EKG  unchanged. Trop negative. WBC was 16.7. On chronic Dexilant. Given Carafate suspension and Pepcid BID but symptoms not improved. Sleeps on two pillows. Wakes up gagging.  No unusual SOB. Weight is stable. Doesn't go to be on an empty stomach- snacks at night.  Last EGD Sept 2017 for reflux - patent Schatzi's ring.  Grade A esophagitis.    Past Medical History:  Diagnosis Date  . Acute MI ([place])    x3 - by report only. She has had 3 negative Myoview stress test with no evidence of prior infarct.  . Allergic rhinitis   . Allergy   . Anemia   . Ankle fracture 05/2016  . Anxiety   . Arthritis   . Asthma   . Barrett esophagus 2007  . Chest pain 05-02-2009   echo  EF 55%  . COPD (chronic obstructive pulmonary disease) with chronic bronchitis (HCC)    & Emphysema  . Depression   . Duodenitis without mention of hemorrhage 2007  . Esophageal reflux 2007  . Esophageal stricture   . Full dentures   . H/O hiatal hernia   . Hiatal hernia 4098,1191  . Hyperlipidemia   . Multiple fractures    from falls, fx rt. elbow, fx left wrist, bilateral ankles  . Pneumonia 10/2016  . Restrictive lung disease 06/30/2017  . Ulcer     Patient's surgical history, family medical history, social history, medications and allergies were all reviewed in Epic    Physical Exam: BP 120/80   Pulse 78   Ht 5\' 4"  (1.626  m)   Wt 142 lb 12.8 oz (64.8 kg)   SpO2 98%   BMI 24.51 kg/m    GENERAL: well developed white female in NAD PSYCH: :Pleasant, cooperative, normal affect EENT:  conjunctiva pink, mucous membranes moist, neck supple without masses CARDIAC:  RRR, no murmur heard, no peripheral edema ABDOMEN: soft, non-distended, non-tender, no masses felt. Normal bowel sounds.  NEURO: Alert and oriented, no focal neurologic deficits    ASSESSMENT / PLAN:   42.  61 year old female with long-standing GERD presenting with severe chest burning and regurgitation refractory to PPI, H2 blockers, Carafate.  Acid production should be adequately suppressed..  Patient has long-standing chronic pain syndrome further complicating the picture  -Discontinue the Carafate its not helping -Continue Dexilant in the morning.  Decrease Pepcid from twice daily to nightly -Antireflux measures discussed.  She needs a wedge pillow.  Needs to go to bed on an empty stomach -We will give her a prescription for GI cocktail to keep at home for refractory heartburn  2. Leukocytosis, WBC 16.7 in ED 11/30. No coughing except when associated with reflux. No urinary sx. Has COPD but I don't think she recently took steroids.  -repeat CBC today   Lori [name] , Lori [name] 08/19/2017, 11:12 AM

## 2017-08-19 NOTE — Telephone Encounter (Signed)
Patient states we need to contact [facility] about compound medication sent in today.

## 2017-08-20 ENCOUNTER — Telehealth: Payer: Self-pay | Admitting: *Deleted

## 2017-08-20 IMAGING — MR MR HEAD W/O CM
8 of 10 series · 38 of 48 positions shown · non-contrast
Comparison: 04/07/2015 head CT.  08/09/2005 brain MR.

CLINICAL DATA: 58-year-old female with headache, dizziness,
weakness and unsteady gait with tingling in both upper extremities
after fall in [REDACTED]. Subsequent encounter.

EXAM:
MRI HEAD WITHOUT CONTRAST
TECHNIQUE: Multiplanar, multiecho pulse sequences of the brain and surrounding
structures were obtained without intravenous contrast.

[Series 3: T1 · sagittal · 5.0mm · 0.47mm/px · 1 of 22 slices shown]
[im 1/22]
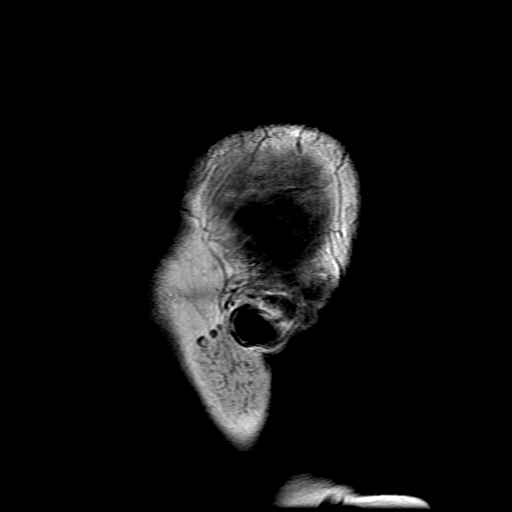

[Series 4: DWI · axial · 3.0mm · 1.09mm/px · z∈[-48,+101]mm · 11 of 102 slices shown (1 of 4)]
[im 1/102]
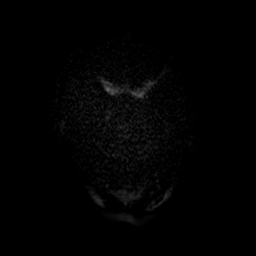
[im 11/102]
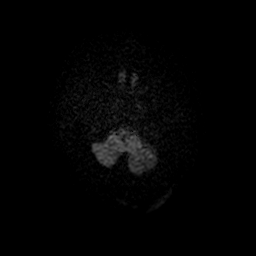
[im 21/102]
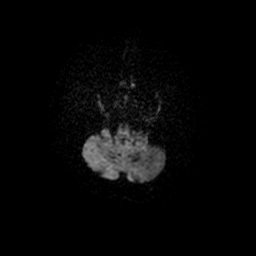
[im 31/102]
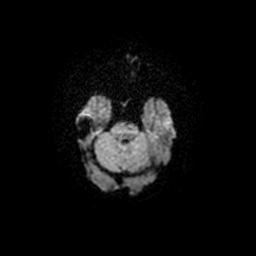
[im 41/102]
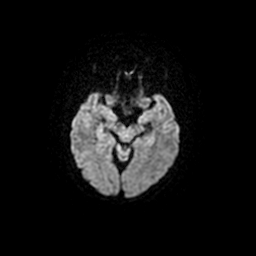
[im 51/102]
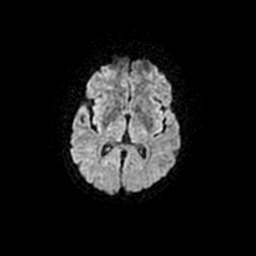
[im 61/102]
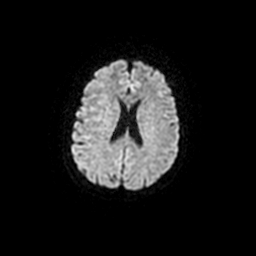
[im 71/102]
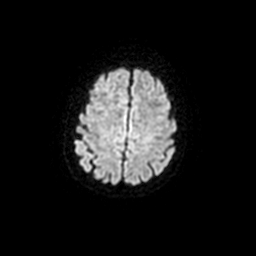
[im 81/102]
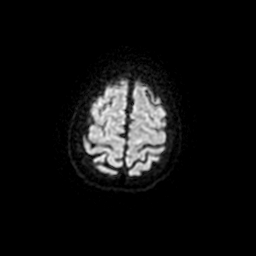
[im 91/102]
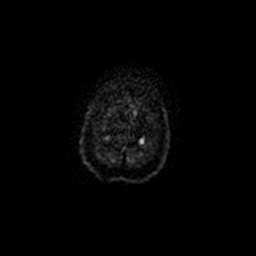
[im 102/102]
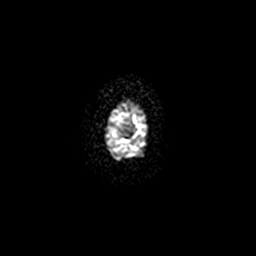

[Series 5: DWI · coronal · 5.0mm · 1.09mm/px · 7 of 62 slices shown (2 of 4)]
[im 1/62]
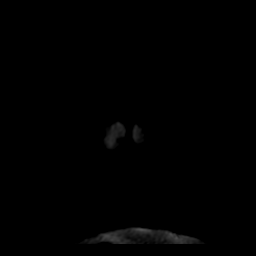
[im 11/62]
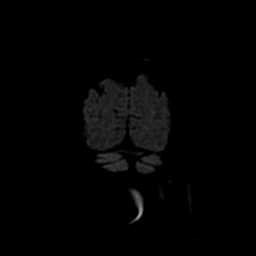
[im 21/62]
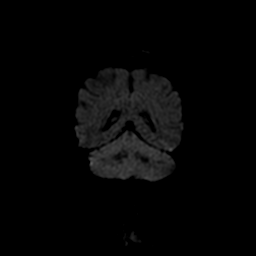
[im 31/62]
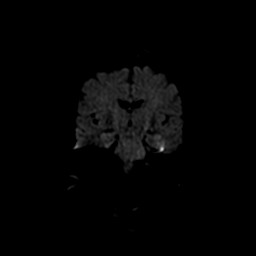
[im 41/62]
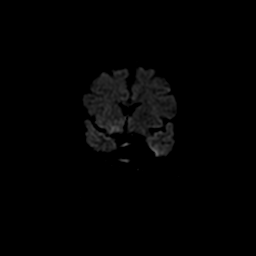
[im 51/62]
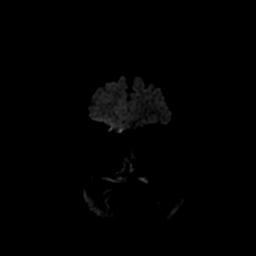
[im 62/62]
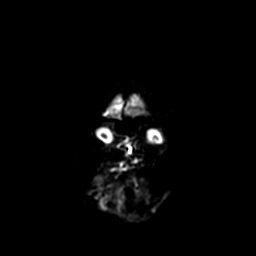

[Series 6: T2 · axial · 5.0mm · 0.43mm/px · z∈[-50,+105]mm · 3 of 25 slices shown (1 of 2)]
[im 1/25]
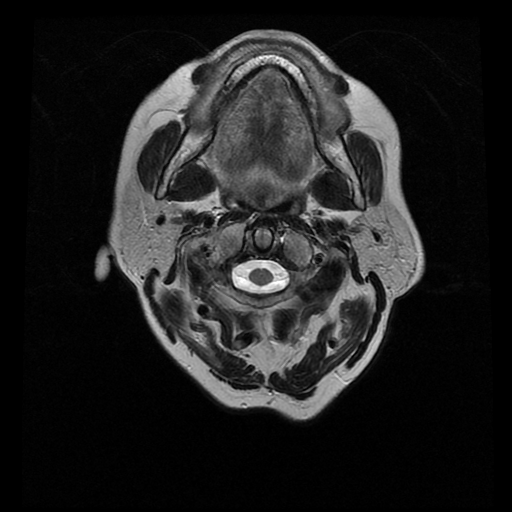
[im 13/25]
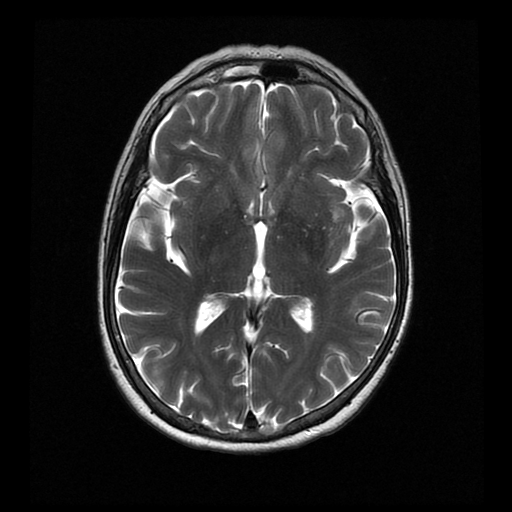
[im 25/25]
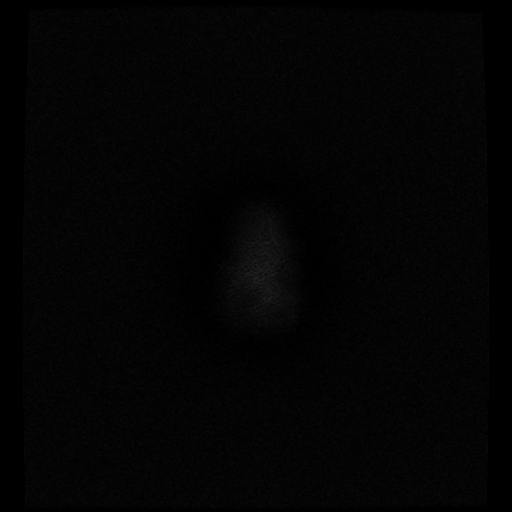

[Series 7: FLAIR · axial · 5.0mm · 0.43mm/px · z∈[-56,+111]mm · 3 of 25 slices shown]
[im 1/25]
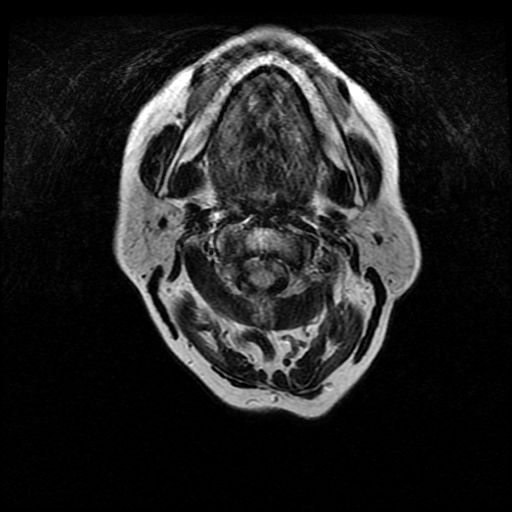
[im 13/25]
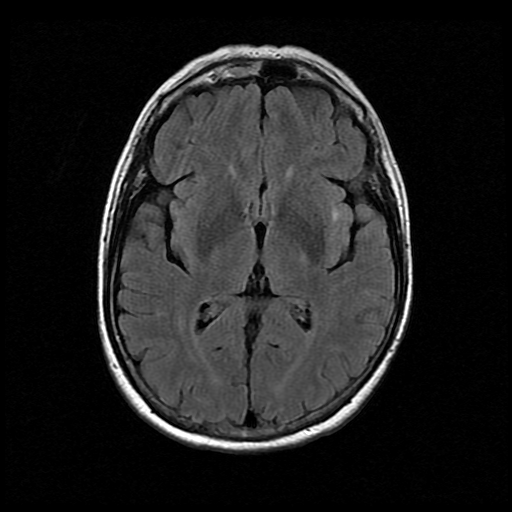
[im 25/25]
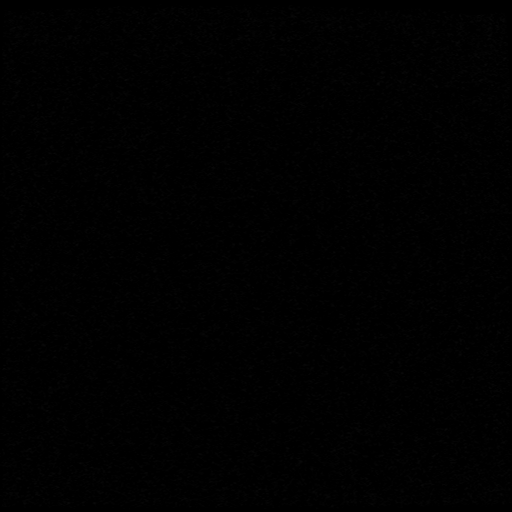

[Series 10: T2 · coronal · 5.0mm · 0.45mm/px · 3 of 25 slices shown (2 of 2)]
[im 1/25]
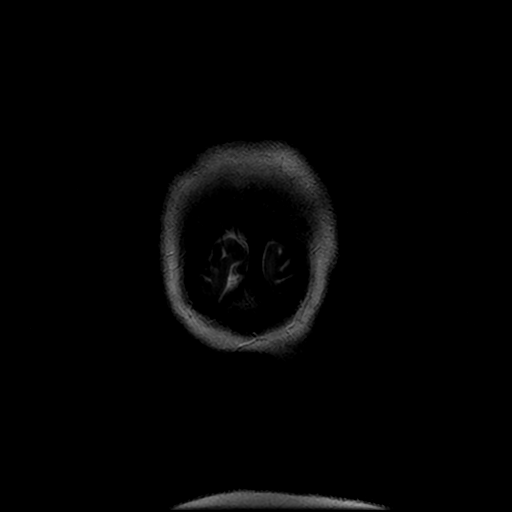
[im 13/25]
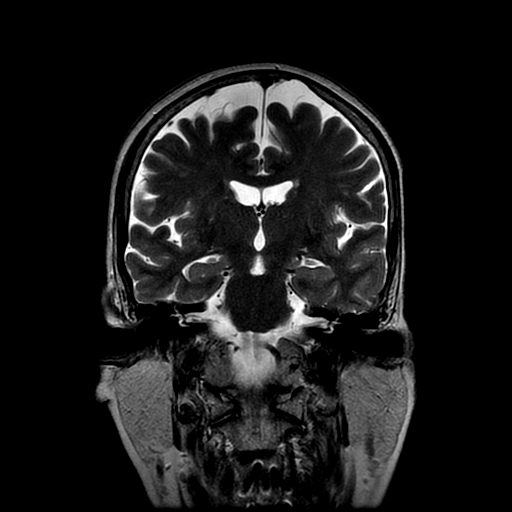
[im 25/25]
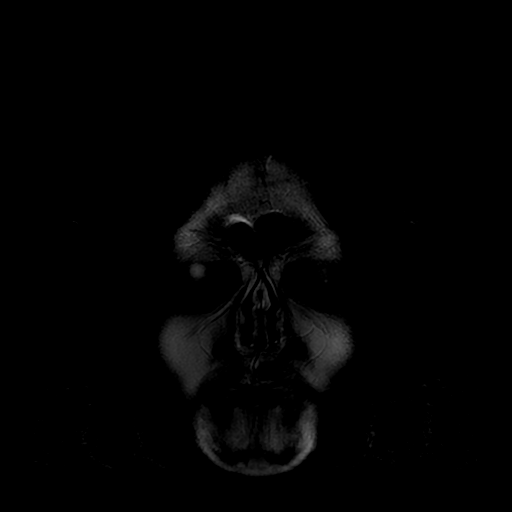

[Series 400: DWI · axial · 3.0mm · 1.09mm/px · z∈[-48,+101]mm · 6 of 51 slices shown (3 of 4)]
[im 1/51]
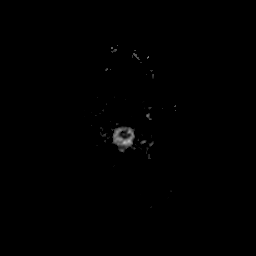
[im 11/51]
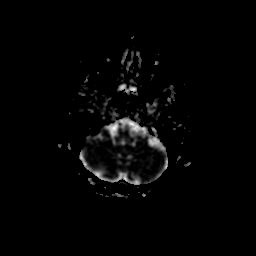
[im 21/51]
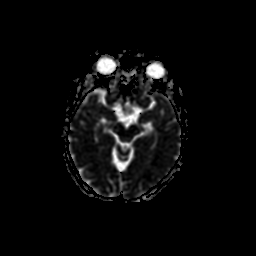
[im 31/51]
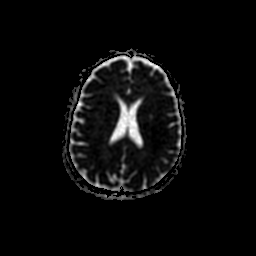
[im 41/51]
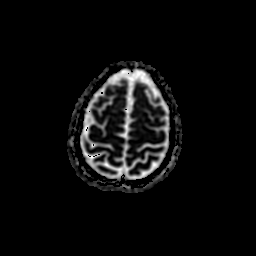
[im 51/51]
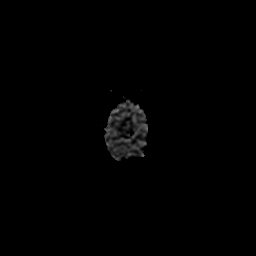

[Series 500: DWI · coronal · 5.0mm · 1.09mm/px · 4 of 31 slices shown (4 of 4)]
[im 1/31]
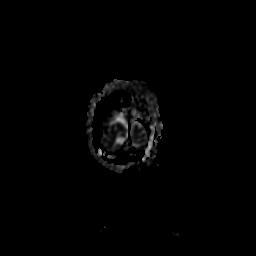
[im 11/31]
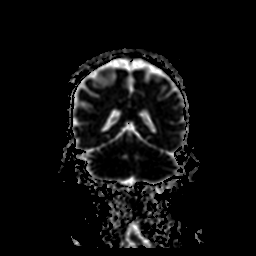
[im 21/31]
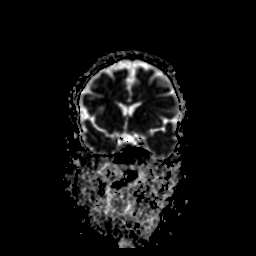
[im 31/31]
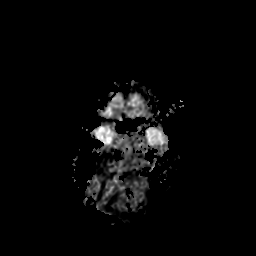

[38 of 48 positions shown; findings below may reference images not displayed]

FINDINGS: No acute infarct.

No intracranial hemorrhage.

Scattered punctate and patchy nonspecific white matter type changes
often seen as result of small vessel disease or migraine headaches.
Other less likely considerations include white matter type changes
secondary to vasculitis, inflammatory process, demyelinating process
or prior trauma.

No hydrocephalus.

No intracranial mass lesion noted on this unenhanced exam.

Cervical medullary junction, pituitary region, pineal region and
orbital structures unremarkable.

Postsurgical changes upper cervical spine.
IMPRESSION: No acute infarct.

No intracranial hemorrhage.

Scattered punctate and patchy nonspecific white matter type changes
often seen as result of small vessel disease or migraine headaches.
Other less likely considerations include white matter type changes
secondary to vasculitis, inflammatory process, demyelinating process
or prior trauma.

No hydrocephalus.

No intracranial mass lesion noted on this unenhanced exam.

## 2017-08-20 MED ORDER — GABAPENTIN 300 MG PO CAPS: ORAL_CAPSULE | ORAL | 3 refills | Status: DC

## 2017-08-20 NOTE — Telephone Encounter (Signed)
Spoke with pharmacist regarding prescription.  Prescription is ready and shouldn't be too expensive for patient. Patient advised.

## 2017-08-20 NOTE — Telephone Encounter (Signed)
Copied from [facility] [phone]. Topic: Inquiry >> Aug 19, 2017  4:10 PM Conception Chancy, NT wrote: Reason for CRM: pt states a surgery doctor is supposed to be calling her to set up and she has not heard from anyone. Also she want she gabapentin 100 mg and it is not enough for her and she would like something stronger.

## 2017-08-20 NOTE — Telephone Encounter (Signed)
Discussed with pt. She states that she is taking gabapentin 200mg  qhs & it is not helping.

## 2017-08-20 NOTE — Telephone Encounter (Signed)
Peter Congo, will you take care of this? Thanks

## 2017-08-20 NOTE — Telephone Encounter (Signed)
Increased dose to 300mg  at night  Sent in new rx

## 2017-08-20 NOTE — Telephone Encounter (Signed)
Pt made aware

## 2017-08-20 NOTE — Telephone Encounter (Signed)
Patient is calling back about her medication

## 2017-08-21 ENCOUNTER — Encounter: Payer: Self-pay | Admitting: Nurse Practitioner

## 2017-08-21 LAB — IGE+ALLERGENS ZONE 2(30)
Alternaria Alternata IgE: <0.1 kU/L
Bahia Grass IgE: <0.1 kU/L
Common Silver Birch IgE: <0.1 kU/L
Elm, American IgE: <0.1 kU/L
IgE (Immunoglobulin E), Serum: 8 IU/mL (ref 0–100)
Johnson Grass IgE: <0.1 kU/L
Mucor Racemosus IgE: <0.1 kU/L
Mugwort IgE Qn: <0.1 kU/L
Nettle IgE: <0.1 kU/L
Oak, White IgE: <0.1 kU/L
Penicillium Chrysogen IgE: <0.1 kU/L
Pigweed, Rough IgE: <0.1 kU/L
Plantain, English IgE: <0.1 kU/L
Sheep Sorrel IgE Qn: <0.1 kU/L

## 2017-08-21 LAB — ALLERGEN PROFILE, MOLD
Aureobasidi Pullulans IgE: <0.1 kU/L
Candida Albicans IgE: <0.1 kU/L
M009-IgE Fusarium proliferatum: <0.1 kU/L
Phoma Betae IgE: <0.1 kU/L

## 2017-08-22 ENCOUNTER — Other Ambulatory Visit: Payer: Self-pay | Admitting: *Deleted

## 2017-08-22 ENCOUNTER — Encounter: Payer: Self-pay | Admitting: *Deleted

## 2017-08-22 ENCOUNTER — Other Ambulatory Visit: Payer: Self-pay

## 2017-08-22 DIAGNOSIS — D72829 Elevated white blood cell count, unspecified: Secondary | ICD-10-CM

## 2017-08-22 NOTE — Patient Outreach (Signed)
[facility]) Care Management  08/22/2017  Lori [name] [birth year] [id number]    Referral received 12/3  RN spoke with pt today and verified identifiers. Introduced the [facility] program and the purpose for today's call. Pt states she is experiencing pain to her back area but she has pain medications and pending a consult with a surgery later this month. Pt has a supportive spouse and able to complete her ADL/IADLs with minimal limitation. Pt reports she has life line services for any emergencies she will push the button for emergency services. RN inquired on any DME needed or community resources and pt declined. RN further offered [facility] community home visit to further engage in her care for a possible home evaluation or to address any other medical needs such as her COPD. Pt states she is managing his well with no problems and opt to decline any home visits at this time. Pt states she had issues with chest pain with an ED visit earlier this month but this has been resolved with heartburn medication and she has not experienced any additional issues with this problem. States her WBC are elevated but her provider is aware and she will undergo repeat lab-work after Christmas. RN verified pt is not experiencing any fevers, chills and has no open wounds to her skin anywhere.  Pt state she does not need anything at this time but appreciative for the call. RN has informed pt that her provider will be notified of her disposition with [facility] services. Will send discipline letter to her primary provider concerning community case management services.  Raina Mina, RN Care Management Coordinator [place] Office [phone]

## 2017-08-22 NOTE — Progress Notes (Signed)
Pt advised that testing showed no evidence of allergies that are not covered in her current allergy shots. Pt states she would like to proceed with making her maintenance vials every two weeks.

## 2017-08-25 ENCOUNTER — Telehealth: Payer: Self-pay | Admitting: Pharmacist

## 2017-08-25 NOTE — Patient Outreach (Signed)
[facility]) Care Management  08/25/2017  Lori [name] [birth year] [id number]   Patient was called per referral regarding medication assistance and med review. Unfortunately, patient did not answer the phone.  HIPAA compliant message was left on her voicemail.  Plan: Call patient back in 3-5 business days.   Elayne Guerin, PharmD, [place] Clinical Pharmacist [phone]

## 2017-08-25 NOTE — Progress Notes (Signed)
Thank you for sending this case to me. I have reviewed the entire note, and the outlined plan seems appropriate.  She has refractory heartburn due to visceral hypersensitivity from chronic pain syndrome. I have been reluctant to give her metoclopramide out of concern for possible side effects and polypharmacy.  Wilfrid Lund, MD

## 2017-08-27 ENCOUNTER — Ambulatory Visit: Payer: Medicare Other | Admitting: Gastroenterology

## 2017-08-28 ENCOUNTER — Other Ambulatory Visit: Payer: Self-pay | Admitting: Pharmacist

## 2017-08-28 ENCOUNTER — Ambulatory Visit: Payer: Self-pay | Admitting: Pharmacist

## 2017-08-28 ENCOUNTER — Encounter: Payer: Self-pay | Admitting: Pharmacist

## 2017-08-28 NOTE — Patient Outreach (Signed)
[facility]) Care Management  [place]   08/28/2017  Lori [name] Oct 11, 1955 [id number]  Subjective: Patient called me back after my phone call to her. HIPAA identifiers were obtained.  Patient is a 61 year old female with multiple medical conditions including but not limited to  Fibromyalgia, depression, dizziness, allergic rhinitis, GERD, hyperlipidemia, and gastroparesis.  The referral for the patient stated she could not afford a medication for irritable bowel.  Patient could not remember the name of the medication but said she took it to [facility] and it was >$300.  Objective:   Encounter Medications: Outpatient Encounter Medications as of 08/28/2017  Medication Sig  . aspirin 81 MG EC tablet Take 1 tablet (81 mg total) by mouth daily. Swallow whole.  . cyclobenzaprine (FLEXERIL) 5 MG tablet Take 5 mg by mouth daily as needed for muscle spasms.  . DEXILANT 60 MG capsule Take 60 mg by mouth daily.  . famotidine (PEPCID) 20 MG tablet Take 1 tablet (20 mg total) by mouth 2 (two) times daily.  Marland Kitchen gabapentin (NEURONTIN) 300 MG capsule nightly (Patient taking differently: Take 300 mg by mouth at bedtime. nightly)  . meclizine (ANTIVERT) 12.5 MG tablet Take 1 tablet (12.5 mg total) by mouth 3 (three) times daily as needed for dizziness.  . montelukast (SINGULAIR) 10 MG tablet Take 1 tablet (10 mg total) by mouth at bedtime.  . nitroGLYCERIN (NITROSTAT) 0.3 MG SL tablet Place 0.3 mg under the tongue every 5 (five) minutes as needed for chest pain.  . NONFORMULARY OR COMPOUNDED ITEM GI COCKTAIL 33ml 2 % lidocaine 79ml Dicyclomine 10mg /63ml 246ml Miralax   TAKE 1-2 TIMES DAILY AS NEEDED  . ondansetron (ZOFRAN ODT) 4 MG disintegrating tablet 4mg  ODT q4 hours prn nausea/vomit  . PROAIR HFA 108 (90 Base) MCG/ACT inhaler Take 1-2 puffs by mouth every 4 (four) hours as needed for shortness of breath or wheezing.  . promethazine (PHENERGAN) 25 MG suppository Place 1  suppository (25 mg total) rectally every 6 (six) hours as needed for nausea or vomiting.  . valACYclovir (VALTREX) 500 MG tablet TAKE 1 TABLET(500 MG) BY MOUTH DAILY  . rosuvastatin (CRESTOR) 20 MG tablet Take 1 tablet by mouth Nightly.   No facility-administered encounter medications on file as of 08/28/2017.     Functional Status: In your present state of health, do you have any difficulty performing the following activities: 07/02/2017  Hearing? N  Vision? N  Difficulty concentrating or making decisions? N  Walking or climbing stairs? N  Dressing or bathing? N  Doing errands, shopping? N  Preparing Food and eating ? N  Using the Toilet? N  In the past six months, have you accidently leaked urine? N  Do you have problems with loss of bowel control? N  Managing your Medications? N  Managing your Finances? N  Housekeeping or managing your Housekeeping? N  Some recent data might be hidden    Fall/Depression Screening: Fall Risk  07/02/2017 06/30/2017 06/21/2016  Falls in the past year? No Yes Yes  Comment - - -  Number falls in past yr: - 1 2 or more  Injury with Fall? - No Yes  Comment - - -  Risk Factor Category  - - High Fall Risk  Risk for fall due to : - - History of fall(s)   PHQ 2/9 Scores 08/18/2017 07/02/2017 06/21/2016  PHQ - 2 Score 1 2 0  PHQ- 9 Score - 7 -  Assessment: Patient's medications were reviewed over the phone.     Drugs sorted by system:  Neurologic/Psychologic: gabapentin  Cardiovascular: Aspirin Rosuvastatin-patient said she is not taking Nitroglycerin   Pulmonary/Allergy: Montelukast ProAir  Gastrointestinal: Dexilant Famotidine GI Cocktail Meclizine Ondansetron ODT  Topical: Nicotine Patch-not using  Pain: Cyclobenzaprine Tramadol-not taking  Infectious Diseases: Valacyclovir  Medication Review Findings:  Adherence Rosuvastatin-patient reported not taking this due to reading the side effects on the Internet and  increased pain.    Nicotine Patches-patient reported the nicotine patches made her nauseated so she stopped using them  Tramadol-patient reported tramadol made her nauseated and caused vomiting so she stopped taking it    Medication Assistance Findings:  -patient could not remember the name of the medication she originally reported she could not afford.  -her medical record was searched. The only GI medication recently prescribed was GI cocktail.  -GI cocktail was filled at [facility]. [place] was called. The Pharmacy Technician at [facility] said GI cocktail was filled 08/20/17 and the cost was $0.00  -[facility] (the patient's normal pharmacy) was called. The pharmacist reviewed the patient's profile and did not have any medications "put on hold" or waiting to be picked up for the patient that cost anywhere close to $300.  -Patient said a nurse from her insurance company had come to her home and wrote down an alternative medication for to share with her provider. The patient reported she could not find the paper to read/spell what the nurse wrote down.  Unfortunately, without the name of the medication, patient assistance cannot be completed.  Plan:  Note will be routed to the patient's PCP. Patient was instructed to call if she can determine the name of the medication. Pharmacy case will be closed. Closure letters will be sent to the patient and the provider.  Elayne Guerin, PharmD, [place] Clinical Pharmacist [phone]

## 2017-08-28 NOTE — Patient Outreach (Signed)
[facility]) Care Management  08/28/2017  Lori [name] Sep 23, 1955 [id number]   Patient was called per referral regarding medication assistance and med review. Unfortunately, patient did not answer the phone.  HIPAA compliant message was left on her voicemail.  Plan: Call patient back in 5-7 business days.   Elayne Guerin, PharmD, [place] Clinical Pharmacist [phone]

## 2017-08-28 NOTE — Patient Outreach (Addendum)
[place] [facility]) Care Management  [place]   08/28/2017  Lori [name] 1956/04/13 [id number]  Subjective: Patient called back. HIPAA identifiers were obtained. Patient was originally called regarding a medication irritable bowel that she reported not being able to afford. Patient is a 61 year old female with multiple medical conditions including but not limited to:  Fibromyalgia, depression dizziness, allergic rhinitis, GERD, gastroparesis, hyperlipidemia, and low back pain.  Patient could not remember the name of the medication but said the pharmacy reported it cost >$300.  Objective:   Encounter Medications: Outpatient Encounter Medications as of 08/28/2017  Medication Sig Note  . aspirin 81 MG EC tablet Take 1 tablet (81 mg total) by mouth daily. Swallow whole.   . cyclobenzaprine (FLEXERIL) 5 MG tablet Take 5 mg by mouth daily as needed for muscle spasms.   . DEXILANT 60 MG capsule Take 60 mg by mouth daily.   Marland Kitchen gabapentin (NEURONTIN) 300 MG capsule nightly (Patient taking differently: Take 300 mg by mouth at bedtime. nightly)   . montelukast (SINGULAIR) 10 MG tablet Take 1 tablet (10 mg total) by mouth at bedtime.   . nitroGLYCERIN (NITROSTAT) 0.3 MG SL tablet Place 0.3 mg under the tongue every 5 (five) minutes as needed for chest pain.   . NONFORMULARY OR COMPOUNDED ITEM GI COCKTAIL 74ml 2 % lidocaine 44ml Dicyclomine 10mg /35ml 295ml Miralax   TAKE 1-2 TIMES DAILY AS NEEDED   . ondansetron (ZOFRAN ODT) 4 MG disintegrating tablet 4mg  ODT q4 hours prn nausea/vomit   . PROAIR HFA 108 (90 Base) MCG/ACT inhaler Take 1-2 puffs by mouth every 4 (four) hours as needed for shortness of breath or wheezing.   . valACYclovir (VALTREX) 500 MG tablet TAKE 1 TABLET(500 MG) BY MOUTH DAILY   . famotidine (PEPCID) 20 MG tablet Take 1 tablet (20 mg total) by mouth 2 (two) times daily.   . meclizine (ANTIVERT) 12.5 MG tablet Take 1 tablet (12.5 mg total) by mouth 3 (three)  times daily as needed for dizziness.   . promethazine (PHENERGAN) 25 MG suppository Place 1 suppository (25 mg total) rectally every 6 (six) hours as needed for nausea or vomiting.   . [DISCONTINUED] nicotine (NICODERM CQ - DOSED IN MG/24 HOURS) 14 mg/24hr patch Place 1 patch (14 mg total) onto the skin daily. (Patient not taking: Reported on 08/22/2017) 07/18/2017: Hasn't started.  . [DISCONTINUED] rosuvastatin (CRESTOR) 20 MG tablet Take 1 tablet (20 mg total) by mouth daily. (Patient not taking: Reported on 08/22/2017)   . [DISCONTINUED] traMADol (ULTRAM) 50 MG tablet Take 1 tablet (50 mg total) by mouth every 6 (six) hours as needed. (Patient not taking: Reported on 08/28/2017)    No facility-administered encounter medications on file as of 08/28/2017.     Functional Status: In your present state of health, do you have any difficulty performing the following activities: 07/02/2017  Hearing? N  Vision? N  Difficulty concentrating or making decisions? N  Walking or climbing stairs? N  Dressing or bathing? N  Doing errands, shopping? N  Preparing Food and eating ? N  Using the Toilet? N  In the past six months, have you accidently leaked urine? N  Do you have problems with loss of bowel control? N  Managing your Medications? N  Managing your Finances? N  Housekeeping or managing your Housekeeping? N  Some recent data might be hidden    Fall/Depression Screening: Fall Risk  07/02/2017 06/30/2017 06/21/2016  Falls in the past year? No  Yes Yes  Comment - - -  Number falls in past yr: - 1 2 or more  Injury with Fall? - No Yes  Comment - - -  Risk Factor Category  - - High Fall Risk  Risk for fall due to : - - History of fall(s)   PHQ 2/9 Scores 08/18/2017 07/02/2017 06/21/2016  PHQ - 2 Score 1 2 0  PHQ- 9 Score - 7 -      Assessment: Patient's medications were reviewed via telephone:   Drugs sorted by  system:  Neurologic/Psychologic: Gabapentin  Cardiovascular: Aspirin Nitroglycerin Rosuvastatin (not taking)  Pulmonary/Allergy: Montelukast ProAir  Gastrointestinal: Dexilant Ondansetron Meclizine Promethazine suppositories (prn only) Famotidine GI Cocktail (lidocaine/Mylanta/dicyclomine)  Topical: Nicotine patches ( not using)  Pain: Cyclobenzaprine Tramadol (does not take-removed)  Infectious Diseases: Valacyclovir   Medication Review Findings:  Adherence-  Nicotine patches-patient stopped using these because they caused GI upset  Rosuvastatin-patient reported stopping due to pain and because she read the side effects on the Internet  Tramadol-patient reported stopping this due to GI upset  Medication Assistance Findings:  -patient appears to have both [place] and Medicaid -patient could not remember the name of the medication she said was >$300 -both [facility] and [facility] were called and neither pharmacy had a medication on the patient's profile with a $300 copay -patient could not remember what provider prescribed the medication but thinks it could be Dr. Wilfrid Lund.  -review of the chart shows Dr. Chester Holstein saw the patient on 08/19/17 and routed her note to Dr. Loletha Carrow -at the 08/19/17 visit, the patient was prescribed GI cocktail -on 08/19/17 there was a conversation stating [place] needed a call back in reference to the GI Cocktail prescription. -[facility] confirmed filling the GI Cocktail prescription and the patient confirmed picking that prescription up for $0.00 -patient said a home health nurse from her insurance company came to her home and wrote down the name of an alternative medication that would be cheaper but she could not remember what the nurse wrote down. She also could not find the paper the nurse wrote the medication down on for her.   -Medication assistance cannot be pursed at this time as the name  of the medication in question is not known.   Plan: Route note to PCP Route note to Dr. Vanita Ingles office to see if they have another medication on record Follow up with patient in the next 3-5 business days. Close patient case if the name of the medication cannot be determined.   Elayne Guerin, PharmD, [place] Clinical Pharmacist [phone]

## 2017-09-01 ENCOUNTER — Ambulatory Visit (INDEPENDENT_AMBULATORY_CARE_PROVIDER_SITE_OTHER): Payer: Medicare Other | Admitting: Allergy & Immunology

## 2017-09-01 DIAGNOSIS — J309 Allergic rhinitis, unspecified: Secondary | ICD-10-CM

## 2017-09-03 ENCOUNTER — Other Ambulatory Visit: Payer: Self-pay | Admitting: Pharmacist

## 2017-09-03 NOTE — Patient Outreach (Signed)
[place] [facility]) Care Management  09/03/2017  Lori [name] Feb 23, 1956 [id number]   Called patient back to see if she could remember the name of a medication she said was $300 for IBS. HIPAA identifiers were obtained.  Unfortunately, patient could not remember the name of the medication.  Her retail pharmacy was called to get the name and they did not have an active prescription or one on file for the patient with a $300 copay.    Dr. Chester Holstein and Dr. Jenny Reichmann were both sent the pharmacy medication review note from 08/28/17 with a question about the name of the mysterious IBS medication and neither responded.  Plan: Close pharmacy case as medication assistance cannot be pursued.  Elayne Guerin, PharmD, [place] Clinical Pharmacist [phone]

## 2017-09-10 ENCOUNTER — Ambulatory Visit (INDEPENDENT_AMBULATORY_CARE_PROVIDER_SITE_OTHER): Payer: Medicare Other

## 2017-09-10 DIAGNOSIS — J309 Allergic rhinitis, unspecified: Secondary | ICD-10-CM | POA: Diagnosis not present

## 2017-09-15 ENCOUNTER — Other Ambulatory Visit: Payer: Self-pay

## 2017-09-15 ENCOUNTER — Encounter (HOSPITAL_COMMUNITY): Payer: Self-pay | Admitting: Emergency Medicine

## 2017-09-15 ENCOUNTER — Emergency Department (HOSPITAL_COMMUNITY)
Admission: EM | Admit: 2017-09-15 | Discharge: 2017-09-15 | Disposition: A | Discharge status: Home / Self Care | Payer: Medicare Other | Attending: Emergency Medicine | Admitting: Emergency Medicine

## 2017-09-15 DIAGNOSIS — Z7982 Long term (current) use of aspirin: Secondary | ICD-10-CM | POA: Insufficient documentation

## 2017-09-15 DIAGNOSIS — Z87891 Personal history of nicotine dependence: Secondary | ICD-10-CM | POA: Diagnosis not present

## 2017-09-15 DIAGNOSIS — B379 Candidiasis, unspecified: Secondary | ICD-10-CM | POA: Insufficient documentation

## 2017-09-15 DIAGNOSIS — B372 Candidiasis of skin and nail: Secondary | ICD-10-CM

## 2017-09-15 DIAGNOSIS — R21 Rash and other nonspecific skin eruption: Secondary | ICD-10-CM | POA: Diagnosis present

## 2017-09-15 DIAGNOSIS — J449 Chronic obstructive pulmonary disease, unspecified: Secondary | ICD-10-CM | POA: Insufficient documentation

## 2017-09-15 DIAGNOSIS — Z79899 Other long term (current) drug therapy: Secondary | ICD-10-CM | POA: Insufficient documentation

## 2017-09-15 DIAGNOSIS — J45909 Unspecified asthma, uncomplicated: Secondary | ICD-10-CM | POA: Insufficient documentation

## 2017-09-15 MED ORDER — NYSTATIN 100000 UNIT/GM EX POWD: Freq: Three times a day (TID) | CUTANEOUS | 0 refills | Status: DC

## 2017-09-15 MED ORDER — FLUCONAZOLE 150 MG PO TABS
150.0000 mg | ORAL_TABLET | Freq: Once | ORAL | Status: AC
  Administered 2017-09-15: 150 mg via ORAL
  Filled 2017-09-15: qty 1

## 2017-09-15 MED ORDER — FLUCONAZOLE 150 MG PO TABS: 150.0000 mg | ORAL_TABLET | Freq: Every day | ORAL | 0 refills | Status: DC

## 2017-09-15 NOTE — ED Triage Notes (Signed)
Pt with redness to L axilla. Pt states painful and itching.

## 2017-09-15 NOTE — Discharge Instructions (Signed)
Use the powder as directed and take the pill as directed. Follow up with your doctor. Return here as needed.

## 2017-09-15 NOTE — ED Provider Notes (Signed)
[facility] DEPT Provider Note   CSN: [id number] Arrival date & time: 09/15/17  1525     History   Chief Complaint Chief Complaint  Patient presents with  . Rash    L axilla    HPI Lori [name] is a 61 y.o. female who presents to the ED with pain, itching and redness to the left axilla. The rash started about a week ago and has gotten worse. Patient has not tried anything for the rash. Patient denies being diabetic. She does have multiple other chronic health problems as listed in [place].   HPI  Past Medical History:  Diagnosis Date  . Acute MI ([place])    x3 - by report only. She has had 3 negative Myoview stress test with no evidence of prior infarct.  . Allergic rhinitis   . Allergy   . Anemia   . Ankle fracture 05/2016  . Anxiety   . Arthritis   . Asthma   . Barrett esophagus 2007  . Chest pain 05-02-2009   echo  EF 55%  . COPD (chronic obstructive pulmonary disease) with chronic bronchitis (HCC)    & Emphysema  . Depression   . Duodenitis without mention of hemorrhage 2007  . Esophageal reflux 2007  . Esophageal stricture   . Full dentures   . H/O hiatal hernia   . Hiatal hernia 6712,4580  . Hyperlipidemia   . Multiple fractures    from falls, fx rt. elbow, fx left wrist, bilateral ankles  . Pneumonia 10/2016  . Restrictive lung disease 06/30/2017  . Ulcer     Patient Active Problem List   Diagnosis Date Noted  . Lumbar radiculitis 08/15/2017  . Degenerative disc disease, lumbar 07/21/2017  . Encounter for well adult exam with abnormal findings 06/30/2017  . Epigastric pain 06/30/2017  . Restrictive lung disease 06/30/2017  . Leg cramping 06/09/2017  . Elbow contusion 06/09/2017  . Right elbow pain 05/27/2017  . COPD mixed type ([place]) 01/31/2017  . Dizziness 01/20/2017  . Seasonal and perennial allergic rhinitis 01/01/2017  . Moderate persistent asthma, uncomplicated 99/83/3825  . Pulmonary emphysema ([place]) 12/03/2016    . Tobacco use disorder 12/03/2016  . Chronic seasonal allergic rhinitis 12/03/2016  . Medication management 11/29/2016  . Cough 11/23/2016  . Rash 11/23/2016  . Vertigo 10/22/2016  . Gastroesophageal reflux disease with esophagitis 10/22/2016  . Non compliance w medication regimen 08/01/2016  . Allergic contact dermatitis due to metals 08/01/2016  . Migraine without aura and without status migrainosus, not intractable 07/22/2016  . Spasm of muscle of lower back 06/21/2016  . Chronic rhinitis 06/19/2016  . Gait difficulty 06/21/2015  . Fibromyalgia 06/21/2015  . Conversion reaction 06/21/2015  . Depression 06/21/2015  . Syncope and collapse 06/21/2015  . Plica syndrome of left knee 01/06/2015  . Anxiety state 09/06/2014  . History of MI 09/06/2014  . Cigarette smoker 06/27/2013  . Borderline hypertension 06/27/2013  . Leg pain, bilateral 06/14/2013  . Gastroparesis 10/26/2009  . Musculoskeletal chest pain 05/02/2009  . Hyperlipidemia LDL goal <100 11/18/2008  . HEPATIC CYST 11/18/2008    Past Surgical History:  Procedure Laterality Date  . ABDOMINAL HYSTERECTOMY     BSO  . APPENDECTOMY    . CESAREAN SECTION     x 2  . CHOLECYSTECTOMY    . CHONDROPLASTY Left 01/06/2015   Procedure: CHONDROPLASTY;  Surgeon: Marybelle Killings, MD;  Location: [place];  Service: Orthopedics;  Laterality: Left;  .  COLONOSCOPY    . ELBOW FRACTURE SURGERY     right  . KNEE ARTHROSCOPY WITH EXCISION PLICA Left 1/69/6789   Procedure: KNEE ARTHROSCOPY WITH EXCISION PLICA;  Surgeon: Marybelle Killings, MD;  Location: [facility];  Service: Orthopedics;  Laterality: Left;  . Lower Extremity Arterial Dopplers  07/06/2013   RABI 1.0, LABI 1.1.; No evidence of significant vascular atherogenic plaque  . NECK SURGERY    . NM MYOVIEW LTD  09/2014   LOW RISK. Normal EF of 65% with no regional wall motion abnormalities. No ischemia or infarction.  . TONSILLECTOMY    . WRIST FRACTURE  SURGERY     left, has plate    OB History    No data available       Home Medications    Prior to Admission medications   Medication Sig Start Date End Date Taking? Authorizing Provider  aspirin 81 MG EC tablet Take 1 tablet (81 mg total) by mouth daily. Swallow whole. 06/30/17   Biagio Borg, MD  cyclobenzaprine (FLEXERIL) 5 MG tablet Take 5 mg by mouth daily as needed for muscle spasms. 06/11/17   [provider]  DEXILANT 60 MG capsule Take 60 mg by mouth daily. 07/24/17   [provider]  famotidine (PEPCID) 20 MG tablet Take 1 tablet (20 mg total) by mouth 2 (two) times daily. 08/16/17   Pisciotta, Elmyra Ricks, PA-C  fluconazole (DIFLUCAN) 150 MG tablet Take 1 tablet (150 mg total) by mouth daily. 09/15/17   Ashley Murrain, NP  gabapentin (NEURONTIN) 300 MG capsule nightly Patient taking differently: Take 300 mg by mouth at bedtime. nightly 08/20/17   Lyndal Pulley, DO  meclizine (ANTIVERT) 12.5 MG tablet Take 1 tablet (12.5 mg total) by mouth 3 (three) times daily as needed for dizziness. 06/30/17 06/30/18  Biagio Borg, MD  montelukast (SINGULAIR) 10 MG tablet Take 1 tablet (10 mg total) by mouth at bedtime. 05/16/17   Kennith Gain, MD  nitroGLYCERIN (NITROSTAT) 0.3 MG SL tablet Place 0.3 mg under the tongue every 5 (five) minutes as needed for chest pain.    [provider]  NONFORMULARY OR COMPOUNDED ITEM GI COCKTAIL 58ml 2 % lidocaine 66ml Dicyclomine 10mg /70ml 293ml Miralax   TAKE 1-2 TIMES DAILY AS NEEDED 08/19/17   Willia Craze, NP  nystatin (MYCOSTATIN/NYSTOP) powder Apply topically 3 (three) times daily. 09/15/17   Ashley Murrain, NP  ondansetron (ZOFRAN ODT) 4 MG disintegrating tablet 4mg  ODT q4 hours prn nausea/vomit 07/18/17   Deno Etienne, DO  PROAIR HFA 108 [phone] Base) MCG/ACT inhaler Take 1-2 puffs by mouth every 4 (four) hours as needed for shortness of breath or wheezing. 07/08/17   [provider]  promethazine  (PHENERGAN) 25 MG suppository Place 1 suppository (25 mg total) rectally every 6 (six) hours as needed for nausea or vomiting. 07/18/17   Deno Etienne, DO  rosuvastatin (CRESTOR) 20 MG tablet Take 1 tablet by mouth Nightly. 06/30/17   [provider]  valACYclovir (VALTREX) 500 MG tablet TAKE 1 TABLET(500 MG) BY MOUTH DAILY 07/25/17   Biagio Borg, MD    Family History Family History  Problem Relation Age of Onset  . Emphysema Father   . Dementia Mother   . Diabetes Maternal Grandmother   . Diabetes Maternal Aunt   . Diabetes Unknown        mat. cousin  . Heart disease Brother   . Colon cancer Neg Hx   .  Allergic rhinitis Neg Hx   . Angioedema Neg Hx   . Asthma Neg Hx   . Atopy Neg Hx   . Eczema Neg Hx   . Immunodeficiency Neg Hx   . Urticaria Neg Hx     Social History Social History   Tobacco Use  . Smoking status: Former Smoker    Packs/day: 1.00    Years: 39.00    Pack years: 39.00    Types: Cigarettes    Start date: 08/06/1977  . Smokeless tobacco: Never Used  . Tobacco comment: stopped 10/22.   Substance Use Topics  . Alcohol use: No    Alcohol/week: 0.0 oz  . Drug use: No     Allergies   Bee venom; Codeine; Ibuprofen; Tramadol; Hydrocodone-acetaminophen; Latex; Levofloxacin; and Propoxyphene n-acetaminophen   Review of Systems Review of Systems  Musculoskeletal: Positive for arthralgias.  Skin: Positive for rash.  All other systems reviewed and are negative.    Physical Exam Updated Vital Signs BP 119/69 (BP Location: Right Arm)   Pulse 78   Temp 97.6 F (36.4 C) (Oral)   Resp 18   SpO2 97%   Physical Exam  Constitutional: She appears well-developed and well-nourished. No distress.  HENT:  Head: Normocephalic.  Eyes: EOM are normal.  Neck: Neck supple.  Cardiovascular: Normal rate.  Pulmonary/Chest: Effort normal.  Musculoskeletal: Normal range of motion.  Left axilla with bright red rash, tiny area of skin breakdown noted. Rash  consistent with monilia.   Neurological: She is alert.  Skin: Skin is warm and dry.  Psychiatric: She has a normal mood and affect.  Nursing note and vitals reviewed.    ED Treatments / Results  Labs (all labs ordered are listed, but only abnormal results are displayed) Labs Reviewed - No data to display  Radiology No results found.  Procedures Procedures (including critical care time)  Medications Ordered in ED Medications  fluconazole (DIFLUCAN) tablet 150 mg (not administered)     Initial Impression / Assessment and Plan / ED Course  I have reviewed the triage vital signs and the nursing notes. 60 y.o. female with rash, itching and burning to the left axilla c/w monilia. Will teat with nystatin powder and diflucan. Patient to f/u with her PCP. Patient agrees with plan.   Final Clinical Impressions(s) / ED Diagnoses   Final diagnoses:  Yeast infection of the skin    ED Discharge Orders        Ordered    nystatin (MYCOSTATIN/NYSTOP) powder  3 times daily     09/15/17 1821    fluconazole (DIFLUCAN) 150 MG tablet  Daily     09/15/17 1821       Debroah Baller [place], [place] 09/15/17 1844    Charlesetta Shanks, MD 09/19/17 1749

## 2017-09-17 ENCOUNTER — Ambulatory Visit (INDEPENDENT_AMBULATORY_CARE_PROVIDER_SITE_OTHER): Payer: Medicare Other | Admitting: *Deleted

## 2017-09-17 ENCOUNTER — Other Ambulatory Visit (INDEPENDENT_AMBULATORY_CARE_PROVIDER_SITE_OTHER): Payer: Medicare Other

## 2017-09-17 DIAGNOSIS — M5441 Lumbago with sciatica, right side: Secondary | ICD-10-CM | POA: Diagnosis not present

## 2017-09-17 DIAGNOSIS — D72829 Elevated white blood cell count, unspecified: Secondary | ICD-10-CM | POA: Diagnosis not present

## 2017-09-17 DIAGNOSIS — J309 Allergic rhinitis, unspecified: Secondary | ICD-10-CM | POA: Diagnosis not present

## 2017-09-17 DIAGNOSIS — M546 Pain in thoracic spine: Secondary | ICD-10-CM | POA: Diagnosis not present

## 2017-09-17 DIAGNOSIS — G8929 Other chronic pain: Secondary | ICD-10-CM | POA: Diagnosis not present

## 2017-09-17 LAB — CBC WITH DIFFERENTIAL/PLATELET
BASOS PCT: 0.3 % (ref 0.0–3.0)
Basophils Absolute: 0 10*3/uL (ref 0.0–0.1)
EOS PCT: 1.6 % (ref 0.0–5.0)
Eosinophils Absolute: 0.1 10*3/uL (ref 0.0–0.7)
HCT: 35.7 % — ABNORMAL LOW (ref 36.0–46.0)
Hemoglobin: 11.9 g/dL — ABNORMAL LOW (ref 12.0–15.0)
LYMPHS ABS: 3.2 10*3/uL (ref 0.7–4.0)
Lymphocytes Relative: 36.5 % (ref 12.0–46.0)
MCHC: 33.2 g/dL (ref 30.0–36.0)
MCV: 81.2 fl (ref 78.0–100.0)
MONO ABS: 0.5 10*3/uL (ref 0.1–1.0)
Monocytes Relative: 6 % (ref 3.0–12.0)
NEUTROS PCT: 55.6 % (ref 43.0–77.0)
Neutro Abs: 4.9 10*3/uL (ref 1.4–7.7)
Platelets: 321 10*3/uL (ref 150.0–400.0)
RBC: 4.4 Mil/uL (ref 3.87–5.11)
RDW: 16.3 % — AB (ref 11.5–15.5)
WBC: 8.9 10*3/uL (ref 4.0–10.5)

## 2017-09-22 ENCOUNTER — Ambulatory Visit (INDEPENDENT_AMBULATORY_CARE_PROVIDER_SITE_OTHER): Payer: Medicare Other | Admitting: *Deleted

## 2017-09-22 DIAGNOSIS — J309 Allergic rhinitis, unspecified: Secondary | ICD-10-CM

## 2017-09-25 DIAGNOSIS — M5441 Lumbago with sciatica, right side: Secondary | ICD-10-CM | POA: Diagnosis not present

## 2017-09-25 DIAGNOSIS — M546 Pain in thoracic spine: Secondary | ICD-10-CM | POA: Diagnosis not present

## 2017-09-25 DIAGNOSIS — M47814 Spondylosis without myelopathy or radiculopathy, thoracic region: Secondary | ICD-10-CM | POA: Diagnosis not present

## 2017-09-25 DIAGNOSIS — M545 Low back pain: Secondary | ICD-10-CM | POA: Diagnosis not present

## 2017-10-03 DIAGNOSIS — M546 Pain in thoracic spine: Secondary | ICD-10-CM | POA: Diagnosis not present

## 2017-10-03 DIAGNOSIS — M545 Low back pain: Secondary | ICD-10-CM | POA: Diagnosis not present

## 2017-10-13 ENCOUNTER — Ambulatory Visit (INDEPENDENT_AMBULATORY_CARE_PROVIDER_SITE_OTHER): Payer: Medicare Other | Admitting: *Deleted

## 2017-10-13 DIAGNOSIS — J309 Allergic rhinitis, unspecified: Secondary | ICD-10-CM

## 2017-10-14 ENCOUNTER — Telehealth: Payer: Self-pay | Admitting: Internal Medicine

## 2017-10-14 NOTE — Telephone Encounter (Signed)
DEXILANT 60 MG capsule refill Last OV: 06/30/17 (not on dexilant at this time) Last Refill:07/24/17 (by historical provider) 3 refills Pharmacy:[facility] [place], [place]   [phone] (Phone) [phone] (Fax)  Last OV from GI 08/19/17 says to continue [place]; patient is out of med.

## 2017-10-14 NOTE — Telephone Encounter (Signed)
Copied from [place] [id number]. Topic: Quick Communication - Rx Refill/Question >> Oct 14, 2017  3:47 PM Aurelio Brash B wrote: Medication: DEXILANT 60 MG capsule   Has the patient contacted their pharmacy? Yes    (Agent: If no, request that the patient contact the pharmacy for the refill.)   Preferred Pharmacy (with phone number or street name): [facility], [place] [phone] (Phone) [phone] (Fax)  PT is out of medication   Agent: Please be advised that RX refills may take up to 3 business days. We ask that you follow-up with your pharmacy.

## 2017-10-15 ENCOUNTER — Other Ambulatory Visit: Payer: Self-pay | Admitting: Acute Care

## 2017-10-15 ENCOUNTER — Ambulatory Visit: Payer: Medicare Other | Admitting: Pulmonary Disease

## 2017-10-15 MED ORDER — DEXILANT 60 MG PO CPDR: 60.0000 mg | DELAYED_RELEASE_CAPSULE | Freq: Every day | ORAL | 5 refills | Status: DC

## 2017-10-15 NOTE — Telephone Encounter (Signed)
Reviewed chart pt is up-to-date sent refills to [facility].../lmb  

## 2017-10-17 IMAGING — CR DG CHEST 2V
2 series · 2 of 2 positions shown · non-contrast
Comparison: 04/19/2015

CLINICAL DATA: Productive cough and shortness of breath for 3
weeks. COPD.

EXAM:
CHEST  2 VIEW

[w chest pa]
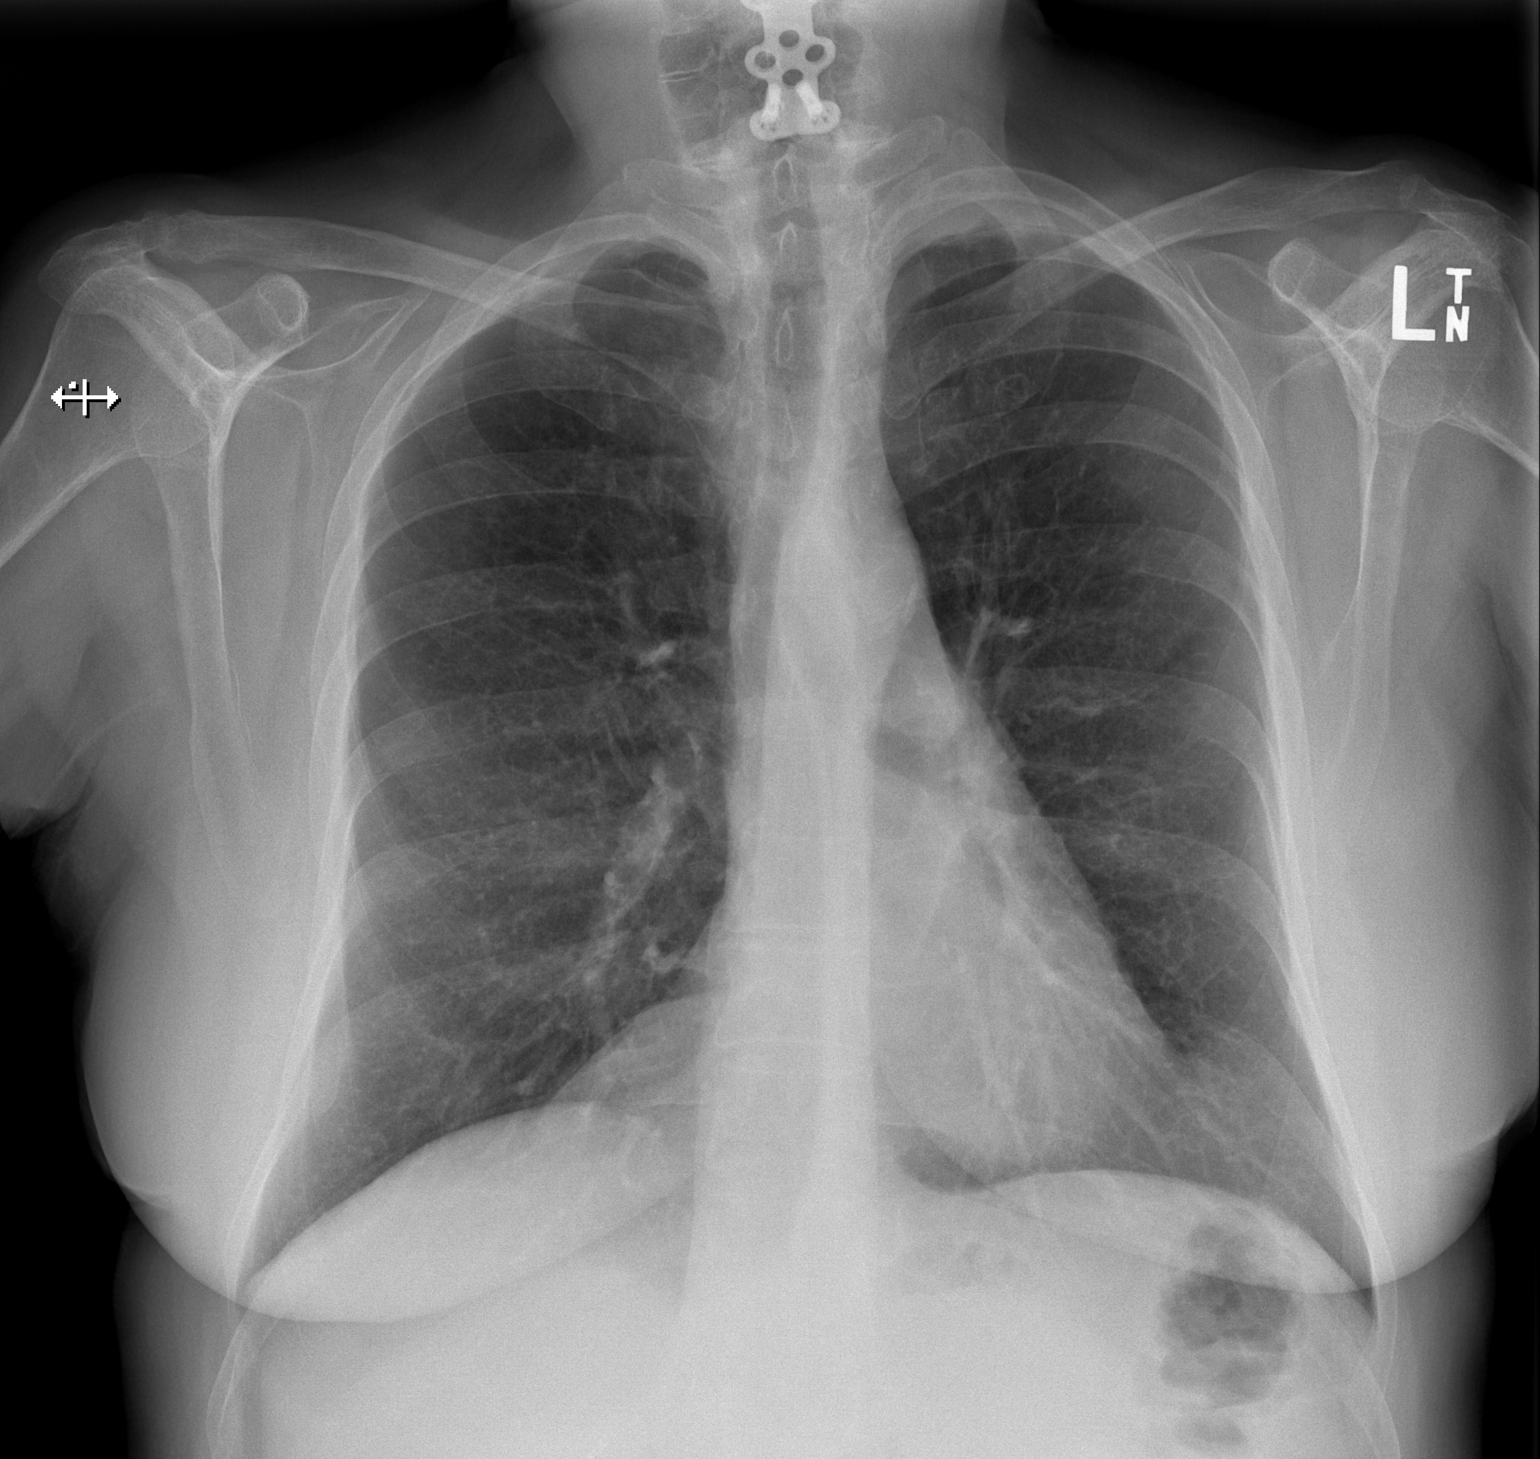

[w chest lat]
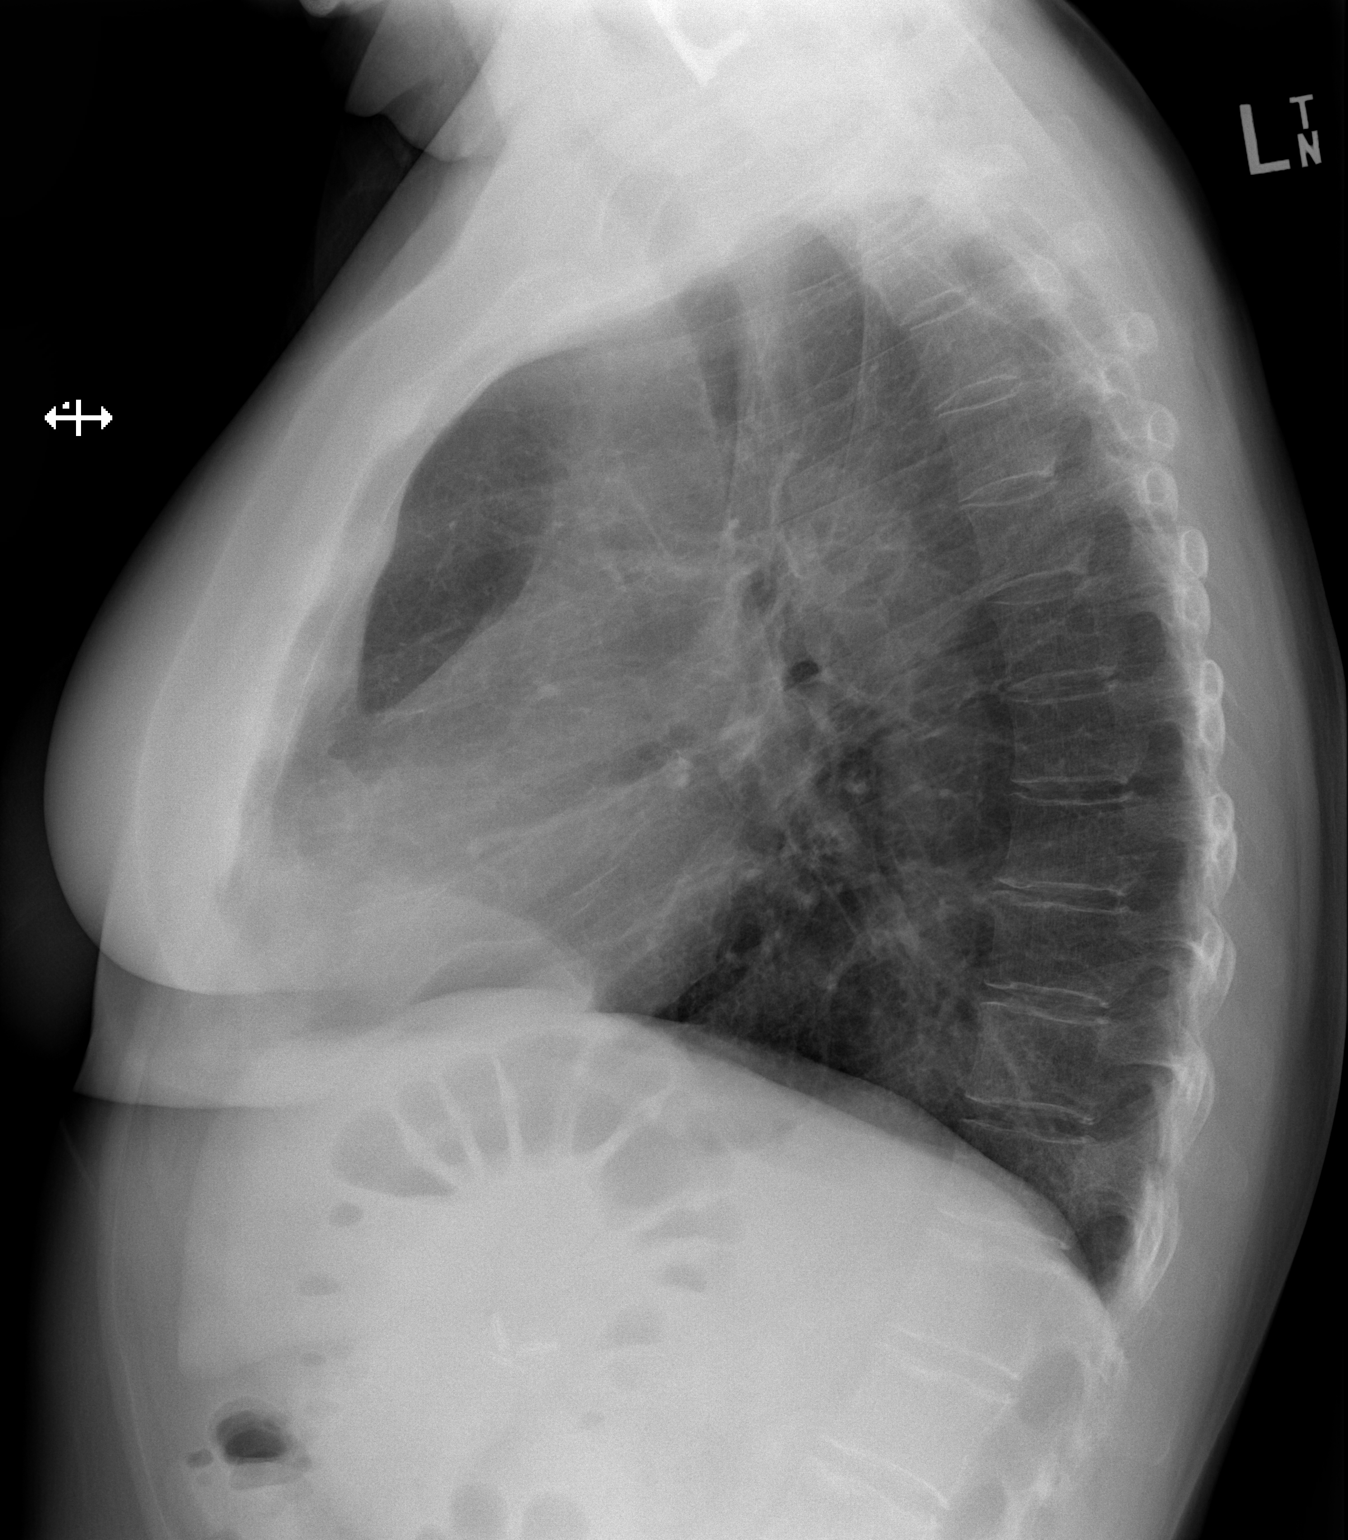

[2 of 2 positions shown; findings below may reference images not displayed]

FINDINGS: The heart size and mediastinal contours are within normal limits.
Both lungs are clear. No evidence of pneumothorax or pleural
effusion. Cervical spine fusion hardware again noted.
IMPRESSION: No active cardiopulmonary disease.

## 2017-11-01 ENCOUNTER — Ambulatory Visit (INDEPENDENT_AMBULATORY_CARE_PROVIDER_SITE_OTHER): Payer: Medicare Other | Admitting: Internal Medicine

## 2017-11-01 ENCOUNTER — Other Ambulatory Visit: Payer: Self-pay | Admitting: Nurse Practitioner

## 2017-11-01 DIAGNOSIS — R05 Cough: Secondary | ICD-10-CM | POA: Diagnosis not present

## 2017-11-01 DIAGNOSIS — R059 Cough, unspecified: Secondary | ICD-10-CM

## 2017-11-01 DIAGNOSIS — J329 Chronic sinusitis, unspecified: Secondary | ICD-10-CM | POA: Diagnosis not present

## 2017-11-01 DIAGNOSIS — K2100 Gastro-esophageal reflux disease with esophagitis, without bleeding: Secondary | ICD-10-CM

## 2017-11-01 DIAGNOSIS — B9689 Other specified bacterial agents as the cause of diseases classified elsewhere: Secondary | ICD-10-CM

## 2017-11-01 DIAGNOSIS — K3184 Gastroparesis: Secondary | ICD-10-CM

## 2017-11-01 MED ORDER — PREDNISONE 10 MG PO TABS: ORAL_TABLET | ORAL | 0 refills | Status: DC

## 2017-11-01 MED ORDER — AMOXICILLIN-POT CLAVULANATE 875-125 MG PO TABS
1.0000 | ORAL_TABLET | Freq: Two times a day (BID) | ORAL | 0 refills | Status: DC
Start: 2017-11-01 — End: 2017-11-19

## 2017-11-01 NOTE — Assessment & Plan Note (Signed)
Given chronicity of symptoms, development of facial pain and exam consistent with bacterial URI,  Will treat with empiric antibiotics, decongestants, and saline lavage.  Adding steroid taper . Probiotic advised.

## 2017-11-01 NOTE — Progress Notes (Signed)
Pre visit review using our clinic review tool, if applicable. No additional management support is needed unless otherwise documented below in the visit note. 

## 2017-11-01 NOTE — Progress Notes (Signed)
Subjective:  Patient ID: Lori [name], female    DOB: [birth year]  Age: 62 y.o. MRN: [id number]  CC: Diagnoses of Bacterial sinusitis, Cough, and Gastroesophageal reflux disease with esophagitis were pertinent to this visit.  HPI CHRISTON GALLAWAY presents for  Evaluation of multiple complaints.   Initially reports FLU LIKE symptoms,  Present for a total of 4 weeks total ,  Not getting better.  Started with diarrhea then nausea then headache.  Diarrhea has resolved.  Still nauseated,  Stomach hurts more after she eats and she has had 2 episodes of vomiting.  No weight loss despite stating  that she is not eating much.  She has a history of gastric ulcer and has resumed taking Dexilant.   Still smoking,  Cough is productive of clear to yellow sputum.  No fevers  Some dyspnea,  Frontal headache , ears feel full    Recent hospitalization for pneumonia reported by patient but not supported by chart.      Outpatient Medications Prior to Visit  Medication Sig Dispense Refill  . aspirin 81 MG EC tablet Take 1 tablet (81 mg total) by mouth daily. Swallow whole. 30 tablet 12  . cyclobenzaprine (FLEXERIL) 5 MG tablet Take 5 mg by mouth daily as needed for muscle spasms.  1  . DEXILANT 60 MG capsule Take 1 capsule (60 mg total) by mouth daily. 30 capsule 5  . famotidine (PEPCID) 20 MG tablet Take 1 tablet (20 mg total) by mouth 2 (two) times daily. 30 tablet 0  . fluconazole (DIFLUCAN) 150 MG tablet Take 1 tablet (150 mg total) by mouth daily. 5 tablet 0  . montelukast (SINGULAIR) 10 MG tablet Take 1 tablet (10 mg total) by mouth at bedtime. 30 tablet 5  . nitroGLYCERIN (NITROSTAT) 0.3 MG SL tablet Place 0.3 mg under the tongue every 5 (five) minutes as needed for chest pain.    Marland Kitchen ondansetron (ZOFRAN ODT) 4 MG disintegrating tablet 4mg  ODT q4 hours prn nausea/vomit 20 tablet 0  . PROAIR HFA 108 (90 Base) MCG/ACT inhaler Take 1-2 puffs by mouth every 4 (four) hours as needed for shortness of breath or  wheezing.    . promethazine (PHENERGAN) 25 MG suppository Place 1 suppository (25 mg total) rectally every 6 (six) hours as needed for nausea or vomiting. 12 each 0  . valACYclovir (VALTREX) 500 MG tablet TAKE 1 TABLET(500 MG) BY MOUTH DAILY 30 tablet 11  . gabapentin (NEURONTIN) 300 MG capsule nightly (Patient not taking: Reported on 11/01/2017) 30 capsule 3  . meclizine (ANTIVERT) 12.5 MG tablet Take 1 tablet (12.5 mg total) by mouth 3 (three) times daily as needed for dizziness. (Patient not taking: Reported on 11/01/2017) 30 tablet 1  . NONFORMULARY OR COMPOUNDED ITEM GI COCKTAIL 59ml 2 % lidocaine 57ml Dicyclomine 10mg /41ml 298ml Miralax   TAKE 1-2 TIMES DAILY AS NEEDED (Patient not taking: Reported on 11/01/2017) 450 each 0  . nystatin (MYCOSTATIN/NYSTOP) powder Apply topically 3 (three) times daily. (Patient not taking: Reported on 11/01/2017) 15 g 0  . rosuvastatin (CRESTOR) 20 MG tablet Take 1 tablet by mouth Nightly.  3   No facility-administered medications prior to visit.     Review of Systems;  Patient denies  fevers,  skin rash, eye pain, sore throat, dysphagia,  hemoptysis , , chest pain, palpitations, orthopnea, edema,  melena, diarrhea, constipation, flank pain, dysuria, hematuria, urinary  Frequency, nocturia, numbness, tingling, seizures,  Focal weakness, Loss of consciousness,  Tremor, insomnia, depression, anxiety,  and suicidal ideation.      Objective:  BP 130/80 (BP Location: Left Arm, Patient Position: Sitting, Cuff Size: Normal)   Pulse 65   Temp 98.2 F (36.8 C) (Oral)   Resp 16   Ht 5\' 4"  (1.626 m)   Wt 142 lb (64.4 kg)   SpO2 99%   BMI 24.37 kg/m   BP Readings from Last 3 Encounters:  11/01/17 130/80  09/15/17 119/69  08/19/17 120/80    Wt Readings from Last 3 Encounters:  11/01/17 142 lb (64.4 kg)  08/19/17 142 lb 12.8 oz (64.8 kg)  08/15/17 143 lb (64.9 kg)    General appearance: alert, cooperative and appears stated age Ears: bilateral  fullness/dullness of TM's with mild erythema , normal  external ear canals both ears Throat: lips, mucosa, and tongue normal; teeth and gums normal Neck: no adenopathy, no carotid bruit, supple, symmetrical, trachea midline and thyroid not enlarged, symmetric, no tenderness/mass/nodules Back: symmetric, no curvature. ROM normal. No CVA tenderness. Lungs: clear to auscultation bilaterally Heart: regular rate and rhythm, S1, S2 normal, no murmur, click, rub or gallop Abdomen: soft, non-tender; bowel sounds normal; no masses,  no organomegaly Pulses: 2+ and symmetric Skin: Skin color, texture, turgor normal. No rashes or lesions Lymph nodes: Cervical, supraclavicular, and axillary nodes normal.  Lab Results  Component Value Date   HGBA1C 5.5 06/21/2016   HGBA1C 5.4 06/14/2013    Lab Results  Component Value Date   CREATININE 0.88 08/15/2017   CREATININE 0.89 07/18/2017   CREATININE 1.00 07/09/2017    Lab Results  Component Value Date   WBC 8.9 09/17/2017   HGB 11.9 (L) 09/17/2017   HCT 35.7 (L) 09/17/2017   PLT 321.0 09/17/2017   GLUCOSE 108 (H) 08/15/2017   CHOL 316 (H) 03/12/2017   TRIG 525 (H) 03/12/2017   HDL 40 03/12/2017   LDLDIRECT 187.0 10/21/2016   LDLCALC Comment 03/12/2017   ALT 17 07/18/2017   AST 20 07/18/2017   NA 134 (L) 08/15/2017   K 4.2 08/15/2017   CL 104 08/15/2017   CREATININE 0.88 08/15/2017   BUN 18 08/15/2017   CO2 21 (L) 08/15/2017   TSH 1.74 06/21/2016   INR 0.9 08/31/2008   HGBA1C 5.5 06/21/2016    No results found.  Assessment & Plan:   Problem List Items Addressed This Visit    Gastroesophageal reflux disease with esophagitis    Patient has been advised to follow up with GI for continued dyspepsia despite use of Dexilant.       Cough    Advised to use delsym for cough.  Add benadryl at night for PND contribution       Bacterial sinusitis    Given chronicity of symptoms, development of facial pain and exam consistent with  bacterial URI,  Will treat with empiric antibiotics, decongestants, and saline lavage.  Adding steroid taper . Probiotic advised.        Relevant Medications   amoxicillin-clavulanate (AUGMENTIN) 875-125 MG tablet   predniSONE (DELTASONE) 10 MG tablet      I am having Elizabeth Sauer. Perusse start on amoxicillin-clavulanate and predniSONE. I am also having her maintain her montelukast, meclizine, aspirin, PROAIR HFA, nitroGLYCERIN, ondansetron, promethazine, valACYclovir, cyclobenzaprine, famotidine, NONFORMULARY OR COMPOUNDED ITEM, gabapentin, rosuvastatin, nystatin, fluconazole, and DEXILANT.  Meds ordered this encounter  Medications  . amoxicillin-clavulanate (AUGMENTIN) 875-125 MG tablet    Sig: Take 1 tablet by mouth 2 (two) times daily.    Dispense:  14 tablet  Refill:  0  . predniSONE (DELTASONE) 10 MG tablet    Sig: 6 tablets on Day 1 , then reduce by 1 tablet daily until gone    Dispense:  21 tablet    Refill:  0    There are no discontinued medications.  Follow-up: No Follow-up on file.   Crecencio Mc, MD

## 2017-11-01 NOTE — Patient Instructions (Signed)
You do not have the flu I am treating you for sinusitis/otitis which is a complication from your viral infection due to  persistent sinus congestion.   I am prescribing an antibiotic (augmentin) and a prednisone taper  To manage the infection and the inflammation in your ear/sinuses.   I also advise use of the following OTC meds to help with your other symptoms.   Take generic OTC benadryl 25 mg every 8 hours for the drainage,  Sudafed PE  10 to 30 mg every 8 hours for the congestion, you may substitute Afrin nasal spray for the nighttime dose of sudafed PE  If needed to prevent insomnia.  flush your sinuses twice daily with Simply Saline (do over the sink because if you do it right you will spit out globs of mucus)  Use Delsym    FOR THE daytime COUGH.  Gargle with salt water as needed for sore throat.   Please take a probiotic ( Align, Floraque or Culturelle) while you are on the antibiotic to prevent  the  serious antibiotic associated diarrhea  Called clostridium dificile colitis and a vaginal yeast infection

## 2017-11-02 NOTE — Assessment & Plan Note (Signed)
Advised to use delsym for cough.  Add benadryl at night for PND contribution

## 2017-11-02 NOTE — Assessment & Plan Note (Signed)
Patient has been advised to follow up with GI for continued dyspepsia despite use of Dexilant.

## 2017-11-07 ENCOUNTER — Other Ambulatory Visit: Payer: Self-pay | Admitting: Internal Medicine

## 2017-11-07 NOTE — Telephone Encounter (Signed)
Copied from [place]. Topic: Inquiry >> Nov 07, 2017  2:55 PM Pricilla Handler wrote: Reason for CRM: Patient called requesting a refill of Amoxicillin-clavulanate (AUGMENTIN) 875-125 MG tablet. Patient's preferred pharmacy is [facility] [id number], [place] [phone] (Phone)     [phone] (Fax).

## 2017-11-08 NOTE — Telephone Encounter (Signed)
Antibiotics are not usually refilled.  Please call the patient and  Triage to see if she is having continued symptoms.

## 2017-11-10 ENCOUNTER — Ambulatory Visit (INDEPENDENT_AMBULATORY_CARE_PROVIDER_SITE_OTHER)
Admission: RE | Admit: 2017-11-10 | Discharge: 2017-11-10 | Disposition: A | Discharge status: Home / Self Care | Payer: Medicare Other | Source: Ambulatory Visit | Attending: Family | Admitting: Family

## 2017-11-10 ENCOUNTER — Ambulatory Visit: Payer: Self-pay

## 2017-11-10 ENCOUNTER — Ambulatory Visit (INDEPENDENT_AMBULATORY_CARE_PROVIDER_SITE_OTHER): Payer: Medicare Other | Admitting: Family

## 2017-11-10 ENCOUNTER — Encounter: Payer: Self-pay | Admitting: Family

## 2017-11-10 VITALS — BP 132/80 | HR 79 | Temp 98.3°F | Ht 64.0 in | Wt 144.0 lb

## 2017-11-10 DIAGNOSIS — R059 Cough, unspecified: Secondary | ICD-10-CM

## 2017-11-10 DIAGNOSIS — R05 Cough: Secondary | ICD-10-CM

## 2017-11-10 DIAGNOSIS — R140 Abdominal distension (gaseous): Secondary | ICD-10-CM | POA: Diagnosis not present

## 2017-11-10 MED ORDER — BENZONATATE 100 MG PO CAPS: 100.0000 mg | ORAL_CAPSULE | Freq: Three times a day (TID) | ORAL | 0 refills | Status: DC | PRN

## 2017-11-10 NOTE — Telephone Encounter (Signed)
Pt riaged and appt made for pt today at [facility].

## 2017-11-10 NOTE — Progress Notes (Signed)
Lori [name] is a 62 y.o. female with the following history as recorded in EpicCare:  Patient Active Problem List   Diagnosis Date Noted  . Bacterial sinusitis 11/01/2017  . Lumbar radiculitis 08/15/2017  . Degenerative disc disease, lumbar 07/21/2017  . Encounter for well adult exam with abnormal findings 06/30/2017  . Epigastric pain 06/30/2017  . Restrictive lung disease 06/30/2017  . Leg cramping 06/09/2017  . Elbow contusion 06/09/2017  . Right elbow pain 05/27/2017  . COPD mixed type ([place]) 01/31/2017  . Dizziness 01/20/2017  . Seasonal and perennial allergic rhinitis 01/01/2017  . Moderate persistent asthma, uncomplicated 85/46/2703  . Pulmonary emphysema ([place]) 12/03/2016  . Tobacco use disorder 12/03/2016  . Chronic seasonal allergic rhinitis 12/03/2016  . Medication management 11/29/2016  . Cough 11/23/2016  . Rash 11/23/2016  . Vertigo 10/22/2016  . Gastroesophageal reflux disease with esophagitis 10/22/2016  . Non compliance w medication regimen 08/01/2016  . Allergic contact dermatitis due to metals 08/01/2016  . Migraine without aura and without status migrainosus, not intractable 07/22/2016  . Spasm of muscle of lower back 06/21/2016  . Chronic rhinitis 06/19/2016  . Gait difficulty 06/21/2015  . Fibromyalgia 06/21/2015  . Conversion reaction 06/21/2015  . Depression 06/21/2015  . Syncope and collapse 06/21/2015  . Plica syndrome of left knee 01/06/2015  . Anxiety state 09/06/2014  . History of MI 09/06/2014  . Cigarette smoker 06/27/2013  . Borderline hypertension 06/27/2013  . Leg pain, bilateral 06/14/2013  . Gastroparesis 10/26/2009  . Musculoskeletal chest pain 05/02/2009  . Hyperlipidemia LDL goal <100 11/18/2008  . HEPATIC CYST 11/18/2008    Current Outpatient Medications  Medication Sig Dispense Refill  . amoxicillin-clavulanate (AUGMENTIN) 875-125 MG tablet Take 1 tablet by mouth 2 (two) times daily. 14 tablet 0  . aspirin 81 MG EC tablet  Take 1 tablet (81 mg total) by mouth daily. Swallow whole. 30 tablet 12  . cyclobenzaprine (FLEXERIL) 5 MG tablet Take 5 mg by mouth daily as needed for muscle spasms.  1  . DEXILANT 60 MG capsule Take 1 capsule (60 mg total) by mouth daily. 30 capsule 5  . famotidine (PEPCID) 20 MG tablet Take 1 tablet (20 mg total) by mouth 2 (two) times daily. 30 tablet 0  . gabapentin (NEURONTIN) 300 MG capsule nightly 30 capsule 3  . loperamide (IMODIUM) 2 MG capsule TAKE ONE CAPSULE BY MOUTH TWICE DAILY AS NEEDED FOR DIARRHEA OR LOOSE STOOLS 12 capsule 0  . meclizine (ANTIVERT) 12.5 MG tablet Take 1 tablet (12.5 mg total) by mouth 3 (three) times daily as needed for dizziness. 30 tablet 1  . montelukast (SINGULAIR) 10 MG tablet Take 1 tablet (10 mg total) by mouth at bedtime. 30 tablet 5  . nitroGLYCERIN (NITROSTAT) 0.3 MG SL tablet Place 0.3 mg under the tongue every 5 (five) minutes as needed for chest pain.    . NONFORMULARY OR COMPOUNDED ITEM GI COCKTAIL 67ml 2 % lidocaine 61ml Dicyclomine 10mg /81ml 282ml Miralax   TAKE 1-2 TIMES DAILY AS NEEDED 450 each 0  . nystatin (MYCOSTATIN/NYSTOP) powder Apply topically 3 (three) times daily. 15 g 0  . ondansetron (ZOFRAN ODT) 4 MG disintegrating tablet 4mg  ODT q4 hours prn nausea/vomit 20 tablet 0  . PROAIR HFA 108 (90 Base) MCG/ACT inhaler Take 1-2 puffs by mouth every 4 (four) hours as needed for shortness of breath or wheezing.    . promethazine (PHENERGAN) 25 MG suppository Place 1 suppository (25 mg total) rectally every 6 (six) hours as  needed for nausea or vomiting. 12 each 0  . rosuvastatin (CRESTOR) 20 MG tablet Take 1 tablet by mouth Nightly.  3  . valACYclovir (VALTREX) 500 MG tablet TAKE 1 TABLET(500 MG) BY MOUTH DAILY 30 tablet 11  . benzonatate (TESSALON) 100 MG capsule Take 1 capsule (100 mg total) by mouth 3 (three) times daily as needed. 20 capsule 0   No current facility-administered medications for this visit.     Allergies: Bee venom;  Codeine; Ibuprofen; Tramadol; Hydrocodone-acetaminophen; Latex; Levofloxacin; and Propoxyphene n-acetaminophen  Past Medical History:  Diagnosis Date  . Acute MI ([place])    x3 - by report only. She has had 3 negative Myoview stress test with no evidence of prior infarct.  . Allergic rhinitis   . Allergy   . Anemia   . Ankle fracture 05/2016  . Anxiety   . Arthritis   . Asthma   . Barrett esophagus 2007  . Chest pain 05-02-2009   echo  EF 55%  . COPD (chronic obstructive pulmonary disease) with chronic bronchitis ([facility])    & Emphysema  . Depression   . Duodenitis without mention of hemorrhage 2007  . Esophageal reflux 2007  . Esophageal stricture   . Full dentures   . H/O hiatal hernia   . Hiatal hernia 1025,8527  . Hyperlipidemia   . Multiple fractures    from falls, fx rt. elbow, fx left wrist, bilateral ankles  . Pneumonia 10/2016  . Restrictive lung disease 06/30/2017  . Ulcer     Past Surgical History:  Procedure Laterality Date  . ABDOMINAL HYSTERECTOMY     BSO  . APPENDECTOMY    . CESAREAN SECTION     x 2  . CHOLECYSTECTOMY    . CHONDROPLASTY Left 01/06/2015   Procedure: CHONDROPLASTY;  Surgeon: Marybelle Killings, MD;  Location: [facility];  Service: Orthopedics;  Laterality: Left;  . COLONOSCOPY    . ELBOW FRACTURE SURGERY     right  . KNEE ARTHROSCOPY WITH EXCISION PLICA Left 7/82/4235   Procedure: KNEE ARTHROSCOPY WITH EXCISION PLICA;  Surgeon: Marybelle Killings, MD;  Location: [facility];  Service: Orthopedics;  Laterality: Left;  . Lower Extremity Arterial Dopplers  07/06/2013   RABI 1.0, LABI 1.1.; No evidence of significant vascular atherogenic plaque  . NECK SURGERY    . NM MYOVIEW LTD  09/2014   LOW RISK. Normal EF of 65% with no regional wall motion abnormalities. No ischemia or infarction.  . TONSILLECTOMY    . WRIST FRACTURE SURGERY     left, has plate    Family History  Problem Relation Age of Onset  . Emphysema Father   .  Dementia Mother   . Diabetes Maternal Grandmother   . Diabetes Maternal Aunt   . Diabetes Unknown        mat. cousin  . Heart disease Brother   . Colon cancer Neg Hx   . Allergic rhinitis Neg Hx   . Angioedema Neg Hx   . Asthma Neg Hx   . Atopy Neg Hx   . Eczema Neg Hx   . Immunodeficiency Neg Hx   . Urticaria Neg Hx     Social History   Tobacco Use  . Smoking status: Former Smoker    Packs/day: 1.00    Years: 39.00    Pack years: 39.00    Types: Cigarettes    Start date: 08/06/1977  . Smokeless tobacco: Never Used  . Tobacco comment: stopped  10/22.   Substance Use Topics  . Alcohol use: No    Alcohol/week: 0.0 oz    Subjective:  Patient was seen here on 2/16 with concerns for possible sinus infection; was started on Augmentin and Prednisone; has now finished her medication but still feeling some symptoms; does have chronic problems with allergies- currently undergoing immunotherapy monthly; will not consider using any type of allergy nasal spray; not on any type of antihistamine either;  Also mentions concerns with concerns for abdominal swelling/ bloating x 3-4 years; feels like a "knot" is coming up on left side of abdomen; s/p total hysterectomy; has history of GERD/ ulcers and is currently taking Dexilant; last upper endoscopy and colonoscopy done in 2017; did have abdominal ultrasound at ER in 01/2017; per patient, has had a total hysterectomy.    Objective:  Vitals:   11/10/17 1315  BP: 132/80  Pulse: 79  Temp: 98.3 F (36.8 C)  TempSrc: Oral  SpO2: 99%  Weight: 144 lb (65.3 kg)  Height: 5\' 4"  (1.626 m)    General: Well developed, well nourished, in no acute distress  Skin : Warm and dry.  Head: Normocephalic and atraumatic  Eyes: Sclera and conjunctiva clear; pupils round and reactive to light; extraocular movements intact  Ears: External normal; canals clear; tympanic membranes normal  Oropharynx: Pink, supple. No suspicious lesions  Neck: Supple without  thyromegaly, adenopathy  Lungs: Respirations unlabored; clear to auscultation bilaterally without wheeze, rales, rhonchi  CVS exam: normal rate and regular rhythm.  Abdomen: Soft; nontender; +distended; normoactive bowel sounds; questionable mass noted over left flank- ? Lipoma; Musculoskeletal: No deformities; no active joint inflammation  Extremities: No edema, cyanosis, clubbing  Vessels: Symmetric bilaterally  Neurologic: Alert and oriented; speech intact; face symmetrical; moves all extremities well; CNII-XII intact without focal deficit  Assessment:  1. Cough   2. Abdominal distension (gaseous)     Plan:  1. Low suspicion for continued infection; suspect related to her allergies; update CXR today; she notes she has very little money to pay for any type of medication today; Rx for Tessalon Perles 100 mg tid prn cough; encouraged to follow-up with her allergist; needs to quit smoking; 2. Abdomen is quite distended on exam- per patient this is not her normal abdominal appearance; check CT abd/ pelvic; follow-up to be determined; will most likely need to go back to her GI.    Return for CPE with Dr. Jenny Reichmann.  Orders Placed This Encounter  Procedures  . CT Abdomen Pelvis W Contrast    Standing Status:   Future    Standing Expiration Date:   02/08/2019    Order Specific Question:   If indicated for the ordered procedure, I authorize the administration of contrast media per Radiology protocol    Answer:   Yes    Order Specific Question:   Preferred imaging location?    Answer:   [facility]    Order Specific Question:   Radiology Contrast Protocol - do NOT remove file path    Answer:   \\[email or web address].pdf  . DG Chest 2 View    Standing Status:   Future    Number of Occurrences:   1    Standing Expiration Date:   01/09/2019    Order Specific Question:   Reason for Exam (SYMPTOM  OR DIAGNOSIS REQUIRED)    Answer:   cough    Order Specific Question:    Preferred imaging location?    Answer:   [facility]  Order Specific Question:   Radiology Contrast Protocol - do NOT remove file path    Answer:   \\[email or web address].[email or web address]    Requested Prescriptions   Signed Prescriptions Disp Refills  . benzonatate (TESSALON) 100 MG capsule 20 capsule 0    Sig: Take 1 capsule (100 mg total) by mouth 3 (three) times daily as needed.

## 2017-11-10 NOTE — Telephone Encounter (Signed)
Pt with h/o COPD who is c/o sinus and head congestion. She has yellow sinus drainage and yellow phlegm form cough. Pt has finished her Augmentin and Prednisone that was prescribed 11/01/17. Pt has no fever. Appt made with Jodi Mourning NP today at 1:20 . No appt available for today with PCP  Reason for Disposition . [1] Known COPD or other severe lung disease (i.e., bronchiectasis, cystic fibrosis, lung surgery) AND [2] worsening symptoms (i.e., increased sputum purulence or amount, increased breathing difficulty  Answer Assessment - Initial Assessment Questions 1. ONSET: "When did the cough begin?"      1 month 2. SEVERITY: "How bad is the cough today?"      Wakes her up coughing states cannot get congestion out of chest 3. RESPIRATORY DISTRESS: "Describe your breathing."      "regular breathing" nose is congested and has post nasal drip 4. FEVER: "Do you have a fever?" If so, ask: "What is your temperature, how was it measured, and when did it start?"     no 5. SPUTUM: "Describe the color of your sputum" (clear, white, yellow, green)     yello 6. HEMOPTYSIS: "Are you coughing up any blood?" If so ask: "How much?" (flecks, streaks, tablespoons, etc.)     no 7. CARDIAC HISTORY: "Do you have any history of heart disease?" (e.g., heart attack, congestive heart failure)      On nitroglycerin for chest pain 8. LUNG HISTORY: "Do you have any history of lung disease?"  (e.g., pulmonary embolus, asthma, emphysema)     COPD 9. PE RISK FACTORS: "Do you have a history of blood clots?" (or: recent major surgery, recent prolonged travel, bedridden )     no 10. OTHER SYMPTOMS: "Do you have any other symptoms?" (e.g., runny nose, wheezing, chest pain)       Runny nose and congestion, post nasal drip, glands are swollen,  11. PREGNANCY: "Is there any chance you are pregnant?" "When was your last menstrual period?"       n/a 12. TRAVEL: "Have you traveled out of the country in the last month?" (e.g.,  travel history, exposures)       no  Protocols used: [place]

## 2017-11-11 ENCOUNTER — Ambulatory Visit (INDEPENDENT_AMBULATORY_CARE_PROVIDER_SITE_OTHER): Payer: Medicare Other | Admitting: *Deleted

## 2017-11-11 ENCOUNTER — Telehealth: Payer: Self-pay | Admitting: Internal Medicine

## 2017-11-11 DIAGNOSIS — M542 Cervicalgia: Secondary | ICD-10-CM | POA: Diagnosis not present

## 2017-11-11 DIAGNOSIS — R51 Headache: Secondary | ICD-10-CM | POA: Diagnosis not present

## 2017-11-11 DIAGNOSIS — M791 Myalgia, unspecified site: Secondary | ICD-10-CM | POA: Diagnosis not present

## 2017-11-11 DIAGNOSIS — J309 Allergic rhinitis, unspecified: Secondary | ICD-10-CM | POA: Diagnosis not present

## 2017-11-11 DIAGNOSIS — G43719 Chronic migraine without aura, intractable, without status migrainosus: Secondary | ICD-10-CM | POA: Diagnosis not present

## 2017-11-11 NOTE — Telephone Encounter (Signed)
Copied from [place]. Topic: Inquiry >> Nov 11, 2017  1:19 PM Neva Seat wrote: Pt is calling to check on results of her chest Xray.  Please call pt back asap.

## 2017-11-11 NOTE — Telephone Encounter (Signed)
Routing to [place] for [employer]. Will call patient back once she gives results.

## 2017-11-11 NOTE — Telephone Encounter (Signed)
I called patient to let her know Mickel Baas was out of the office today and would return on tomorrow seeing patients and I would call her as soon as results were available.

## 2017-11-12 ENCOUNTER — Telehealth: Payer: Self-pay

## 2017-11-12 DIAGNOSIS — G8929 Other chronic pain: Secondary | ICD-10-CM | POA: Diagnosis not present

## 2017-11-12 DIAGNOSIS — M545 Low back pain: Secondary | ICD-10-CM | POA: Diagnosis not present

## 2017-11-12 NOTE — Telephone Encounter (Signed)
Per Lori/Mary she will not have any GI issues. It is only to coat the stomach to show up better on the CT scan and reassured her it is NOT like the colonoscopy prep solution like she thought it was. Pt has expressed understanding.   Copied from [place]. Topic: Inquiry >> Nov 12, 2017  9:15 AM Aurelio Brash B wrote: Reason for CRM: Pt states she is picking up a liquid she is to drink before her  abd ct on Friday and she wants to know if it will make her sick on her stomach and if so  will she be given medication for her stomach.  She states she will be by after her apt at 31

## 2017-11-12 NOTE — Telephone Encounter (Signed)
Pt aware her xray was normal

## 2017-11-13 ENCOUNTER — Telehealth: Payer: Self-pay | Admitting: Internal Medicine

## 2017-11-13 DIAGNOSIS — E785 Hyperlipidemia, unspecified: Secondary | ICD-10-CM

## 2017-11-13 NOTE — Telephone Encounter (Signed)
Please advise. I have the lab dropped but unsure of what Dx to use to associate.

## 2017-11-13 NOTE — Telephone Encounter (Signed)
Ok to use Hyperlipidemia as a reason

## 2017-11-13 NOTE — Telephone Encounter (Signed)
Copied from [place]. Topic: General - Other >> Nov 13, 2017  8:50 AM Carolyn Stare wrote:  Tedra Coupe with CT call to say pt need a I STAT CREATININE drawn before her CT on  11/14/17  [facility] said if the order is placed in Epic they can draw the lab before the scan

## 2017-11-13 NOTE — Addendum Note (Signed)
Addended by: Juliet Rude on: 11/13/2017 12:53 PM   Modules accepted: Orders

## 2017-11-13 NOTE — Addendum Note (Signed)
Addended by: Juliet Rude on: 11/13/2017 09:25 AM   Modules accepted: Orders

## 2017-11-14 ENCOUNTER — Encounter (HOSPITAL_COMMUNITY): Payer: Self-pay

## 2017-11-14 ENCOUNTER — Ambulatory Visit (HOSPITAL_COMMUNITY)
Admission: RE | Admit: 2017-11-14 | Discharge: 2017-11-14 | Disposition: A | Discharge status: Home / Self Care | Payer: Medicare Other | Source: Ambulatory Visit | Attending: Family | Admitting: Family

## 2017-11-14 DIAGNOSIS — K760 Fatty (change of) liver, not elsewhere classified: Secondary | ICD-10-CM | POA: Diagnosis not present

## 2017-11-14 DIAGNOSIS — I700 Atherosclerosis of aorta: Secondary | ICD-10-CM | POA: Insufficient documentation

## 2017-11-14 DIAGNOSIS — R140 Abdominal distension (gaseous): Secondary | ICD-10-CM | POA: Diagnosis not present

## 2017-11-14 DIAGNOSIS — R9341 Abnormal radiologic findings on diagnostic imaging of renal pelvis, ureter, or bladder: Secondary | ICD-10-CM | POA: Insufficient documentation

## 2017-11-14 DIAGNOSIS — I708 Atherosclerosis of other arteries: Secondary | ICD-10-CM | POA: Insufficient documentation

## 2017-11-14 LAB — POCT I-STAT CREATININE: CREATININE: 1 mg/dL (ref 0.44–1.00)

## 2017-11-14 MED ORDER — SODIUM CHLORIDE 0.9 % IJ SOLN
INTRAMUSCULAR | Status: AC
  Filled 2017-11-14: qty 50

## 2017-11-14 MED ORDER — IOPAMIDOL (ISOVUE-300) INJECTION 61%
INTRAVENOUS | Status: AC
  Administered 2017-11-14: 100 mL via INTRAVENOUS
  Filled 2017-11-14: qty 100

## 2017-11-14 MED ORDER — IOPAMIDOL (ISOVUE-300) INJECTION 61%
100.0000 mL | Freq: Once | INTRAVENOUS | Status: AC | PRN
  Administered 2017-11-14: 100 mL via INTRAVENOUS

## 2017-11-14 NOTE — Addendum Note (Signed)
Addended by: Juliet Rude on: 11/14/2017 08:11 AM   Modules accepted: Orders

## 2017-11-17 ENCOUNTER — Other Ambulatory Visit: Payer: Self-pay | Admitting: Family

## 2017-11-17 ENCOUNTER — Telehealth: Payer: Self-pay

## 2017-11-17 DIAGNOSIS — N3090 Cystitis, unspecified without hematuria: Secondary | ICD-10-CM

## 2017-11-17 NOTE — Telephone Encounter (Signed)
Routing to Mickel Baas to advise  Copied from [place]. Topic: Quick Communication - Other Results >> Nov 17, 2017 12:26 PM Carolyn Stare wrote:  Pt would like a call back about her CT SCAN   [facility] ordered the scan

## 2017-11-18 ENCOUNTER — Telehealth: Payer: Self-pay

## 2017-11-18 NOTE — Telephone Encounter (Signed)
Spoke with patient on yesterday. She requested copy of CT scan to be mailed to her. Placed copy to go out to her in the mail today.

## 2017-11-18 NOTE — Telephone Encounter (Signed)
-----   Message from Marrian Salvage, [place] sent at 11/17/2017  4:00 PM EST ----- Please let her know that CT did not show specific mass; did show some fatty liver changes in her liver and she needs to be working on diet/ exercise/ weight loss; it does look like there is some possible inflammation in her bladder and I think it would be good to see urology in follow-up; needs to be taking her cholesterol medication;

## 2017-11-19 ENCOUNTER — Ambulatory Visit (INDEPENDENT_AMBULATORY_CARE_PROVIDER_SITE_OTHER): Payer: Medicare Other | Admitting: Allergy & Immunology

## 2017-11-19 ENCOUNTER — Encounter: Payer: Self-pay | Admitting: Allergy & Immunology

## 2017-11-19 VITALS — BP 130/80 | HR 81 | Temp 98.2°F | Resp 17 | Ht 62.0 in | Wt 141.8 lb

## 2017-11-19 DIAGNOSIS — F17210 Nicotine dependence, cigarettes, uncomplicated: Secondary | ICD-10-CM | POA: Diagnosis not present

## 2017-11-19 DIAGNOSIS — Z91199 Patient's noncompliance with other medical treatment and regimen due to unspecified reason: Secondary | ICD-10-CM

## 2017-11-19 DIAGNOSIS — J3089 Other allergic rhinitis: Secondary | ICD-10-CM

## 2017-11-19 DIAGNOSIS — J0101 Acute recurrent maxillary sinusitis: Secondary | ICD-10-CM

## 2017-11-19 DIAGNOSIS — J302 Other seasonal allergic rhinitis: Secondary | ICD-10-CM

## 2017-11-19 DIAGNOSIS — Z9119 Patient's noncompliance with other medical treatment and regimen: Secondary | ICD-10-CM | POA: Diagnosis not present

## 2017-11-19 DIAGNOSIS — J4540 Moderate persistent asthma, uncomplicated: Secondary | ICD-10-CM | POA: Diagnosis not present

## 2017-11-19 MED ORDER — DOXYCYCLINE MONOHYDRATE 100 MG PO CAPS: 100.0000 mg | ORAL_CAPSULE | Freq: Two times a day (BID) | ORAL | 0 refills | Status: AC

## 2017-11-19 MED ORDER — PROAIR HFA 108 (90 BASE) MCG/ACT IN AERS: 1.0000 | INHALATION_SPRAY | RESPIRATORY_TRACT | 1 refills | Status: DC | PRN

## 2017-11-19 MED ORDER — MONTELUKAST SODIUM 10 MG PO TABS: 10.0000 mg | ORAL_TABLET | Freq: Every day | ORAL | 5 refills | Status: DC

## 2017-11-19 NOTE — Progress Notes (Signed)
FOLLOW UP  Date of Service/Encounter:  11/19/17   Assessment:   Seasonal and perennial allergic rhinitis - on allergen immunotherapy but with decreased efficacy as of late  Cigarette smoker  Moderate persistent asthma, uncomplicated  Acute sinusitis - with some improvement s/p Augmentin  Poor compliance   Asthma Reportables:  Severity: moderate persistent  Risk: high Control: not well controlled     Plan/Recommendations:   1. Moderate persistent asthma - complicated by a history of non-compliance and smoking - Lung testing looked good today.  - We will refill your montelukast today.  - Daily controller medication(s): Singulair (montelukast) 10mg  daily - Rescue medications: ProAir 4 puffs every 4-6 hours as needed or albuterol nebulizer one vial puffs every 4-6 hours as needed - Asthma control goals:  * Full participation in all desired activities (may need albuterol before activity) * Albuterol use two time or less a week on average (not counting use with activity) * Cough interfering with sleep two time or less a month * Oral steroids no more than once a year * No hospitalizations   2. Perennial allergic rhinitis  - We will change your frequency of shots to every two weeks to see if this provides some more relief.  - Continue with Singulair 10mg  daily. - Continue with cetirizine (Zyrtec) 10mg  daily to help with the sneezing and other allergy symptoms.   3. Acute sinusitis - We will send in a prescription for doxycycline 100mg  twice daily for two weeks. - This will provide slightly larger coverage.  - We will avoid more prednisone for now. - If you continue to have problems after this, we will send you back to Dr. Janace Hoard.   4. Return in about 6 months (around 05/22/2018).  Subjective:   Lori [name] is a 62 y.o. female presenting today for follow up of  Chief Complaint  Patient presents with  . Sinus Problem  . Asthma    Lori [name] has a  history of the following: Patient Active Problem List   Diagnosis Date Noted  . Bacterial sinusitis 11/01/2017  . Lumbar radiculitis 08/15/2017  . Degenerative disc disease, lumbar 07/21/2017  . Encounter for well adult exam with abnormal findings 06/30/2017  . Epigastric pain 06/30/2017  . Restrictive lung disease 06/30/2017  . Leg cramping 06/09/2017  . Elbow contusion 06/09/2017  . Right elbow pain 05/27/2017  . COPD mixed type ([place]) 01/31/2017  . Dizziness 01/20/2017  . Seasonal and perennial allergic rhinitis 01/01/2017  . Moderate persistent asthma, uncomplicated 82/99/3716  . Pulmonary emphysema ([place]) 12/03/2016  . Tobacco use disorder 12/03/2016  . Chronic seasonal allergic rhinitis 12/03/2016  . Medication management 11/29/2016  . Cough 11/23/2016  . Rash 11/23/2016  . Vertigo 10/22/2016  . Gastroesophageal reflux disease with esophagitis 10/22/2016  . Non compliance w medication regimen 08/01/2016  . Allergic contact dermatitis due to metals 08/01/2016  . Migraine without aura and without status migrainosus, not intractable 07/22/2016  . Spasm of muscle of lower back 06/21/2016  . Chronic rhinitis 06/19/2016  . Gait difficulty 06/21/2015  . Fibromyalgia 06/21/2015  . Conversion reaction 06/21/2015  . Depression 06/21/2015  . Syncope and collapse 06/21/2015  . Plica syndrome of left knee 01/06/2015  . Anxiety state 09/06/2014  . History of MI 09/06/2014  . Cigarette smoker 06/27/2013  . Borderline hypertension 06/27/2013  . Leg pain, bilateral 06/14/2013  . Gastroparesis 10/26/2009  . Musculoskeletal chest pain 05/02/2009  . Hyperlipidemia LDL goal <100 11/18/2008  . HEPATIC  CYST 11/18/2008    History obtained from: chart review and patient.  Lori [name]'s Primary Care Provider is Biagio Borg, MD.     Lori [name] is a 62 y.o. female presenting for a follow up visit. She was last seen in December 2018. At that time, her lung testing actually looked quite  good. She has been on a multitude of different inhalers in the past, but has never used them regularly. Therefore we continued her only on montelukast 10mg  daily as her controller since she was clearly not using any of the others. I did recommend that she talk to her PCP about starting Chantix, which has been on ongoing battle with Lori [name]. She was reporting decreased efficacy with her allergy shots at the last visit. We did obtain an environmental allergy panel which was negative, therefore there was nothing else we could add to her vials. I recommended restarting her Nasacort two sprays per nostril daily as well as continuing with the montelukast and the cetirizine 10mg  daily. We continued allergy shots to every two weeks.   Since the last visit, she reports that she has been sick for all of February. She was givne a course of Augmentin on 11/01/17 for two weeks. This did provide some relief and she was also placed on prednisone with minimal relief. She has no had fevers at all. She remains on her allergy shots and had hers today. She denies reactions to these at all, but she does report that the efficacy of the shots seems to wane around two weeks after the last injection (currently she is on monthly injections). She has not recently seen ENT, although she has been seen in the past by Dr. Melissa Montane at [facility].    From an asthma perspective, she has been fairly stable. It is difficult to get a good idea of how she is doing since she is a poor historian. Overall, her breathing has been good. She is no longer on her montelukast since she ran out of refills. Being out of the montleukast has hmade her wheezing worse. She is on ProAir which she is using around once in the morning and once at night. She does endorse wakening at night having problems with breathing through her nose.   Otherwise, there have been no changes to her past medical history, surgical history, family history, or social history.  Her friend who accompanies her today and every previous visit is here. Evidently she is going to have sinus surgery performed this coming Friday and Lori [name] is going to help her out after the surgery.     Review of Systems: a 14-point review of systems is pertinent for what is mentioned in HPI.  Otherwise, all other systems were negative. Constitutional: negative other than that listed in the HPI Eyes: negative other than that listed in the HPI Ears, nose, mouth, throat, and face: negative other than that listed in the HPI Respiratory: negative other than that listed in the HPI Cardiovascular: negative other than that listed in the HPI Gastrointestinal: negative other than that listed in the HPI Genitourinary: negative other than that listed in the HPI Integument: negative other than that listed in the HPI Hematologic: negative other than that listed in the HPI Musculoskeletal: negative other than that listed in the HPI Neurological: negative other than that listed in the HPI Allergy/Immunologic: negative other than that listed in the HPI    Objective:   Blood pressure 130/80, pulse 81, temperature 98.2 F (36.8  C), resp. rate 17, height 5\' 2"  (1.575 m), weight 141 lb 12.8 oz (64.3 kg), SpO2 95 %. Body mass index is 25.94 kg/m.   Physical Exam:  General: Alert, interactive, in no acute distress. Somewhat Cushingoid facies.  Eyes: No conjunctival injection bilaterally, no discharge on the right, no discharge on the left, no Horner-Trantas dots present and allergic shiners present bilaterally. PERRL bilaterally. EOMI without pain. No photophobia.  Ears: Right TM pearly gray with normal light reflex, Left TM pearly gray with normal light reflex, Right TM intact without perforation and Left TM intact without perforation.  Nose/Throat: External nose within normal limits, nasal crease present and septum midline. Turbinates edematous with thick discharge. Posterior oropharynx mildly  erythematous without cobblestoning in the posterior oropharynx. Tonsils 2+ without exudates.  Tongue without thrush. Lungs: Clear to auscultation without wheezing, rhonchi or rales. No increased work of breathing. CV: Normal S1/S2. No murmurs. Capillary refill <2 seconds.  Skin: Warm and dry, without lesions or rashes. Neuro:   Grossly intact. No focal deficits appreciated. Responsive to questions.  Diagnostic studies:   Spirometry: results abnormal (FEV1: 1.52/66%, FVC: 2.20/73%, FEV1/FVC: 69%).    Spirometry consistent with possible restrictive disease.   Allergy Studies: none    Salvatore Marvel, MD [facility]

## 2017-11-19 NOTE — Patient Instructions (Addendum)
1. Moderate persistent asthma, uncomplicated - Lung testing looked good today.  - We will refill your montelukast today.  - Daily controller medication(s): Singulair (montelukast) 10mg  daily - Rescue medications: ProAir 4 puffs every 4-6 hours as needed or albuterol nebulizer one vial puffs every 4-6 hours as needed - Asthma control goals:  * Full participation in all desired activities (may need albuterol before activity) * Albuterol use two time or less a week on average (not counting use with activity) * Cough interfering with sleep two time or less a month * Oral steroids no more than once a year * No hospitalizations   2. Perennial allergic rhinitis  - We will change your frequency of shots to every two weeks to see if this provides some more relief.  - Continue with Singulair 10mg  daily. - Continue with cetirizine (Zyrtec) 10mg  daily to help with the sneezing and other allergy symptoms.   3. Acute sinusitis - We will send in a prescription for doxycycline 100mg  twice daily for two weeks. - This will provide slightly larger coverage.  - We will avoid more prednisone for now. - If you continue to have problems after this, we will send you back to ENT.   4. Return in about 6 months (around 05/22/2018).   Please inform us of any Emergency Department visits, hospitalizations, or changes in symptoms. Call us before going to the ED for breathing or allergy symptoms since we might be able to fit you in for a sick visit. Feel free to contact us anytime with any questions, problems, or concerns.  It was a pleasure to see you again today!   Websites that have reliable patient information: 1. American Academy of Asthma, Allergy, and Immunology: www.aaaai.org 2. Food Allergy Research and Education (FARE): foodallergy.org 3. Mothers of Asthmatics: http://www.asthmacommunitynetwork.org 4. American College of Allergy, Asthma, and Immunology: www.acaai.org

## 2017-11-20 DIAGNOSIS — M545 Low back pain: Secondary | ICD-10-CM | POA: Diagnosis not present

## 2017-11-20 DIAGNOSIS — R293 Abnormal posture: Secondary | ICD-10-CM | POA: Diagnosis not present

## 2017-11-20 DIAGNOSIS — M542 Cervicalgia: Secondary | ICD-10-CM | POA: Diagnosis not present

## 2017-11-20 DIAGNOSIS — M6281 Muscle weakness (generalized): Secondary | ICD-10-CM | POA: Diagnosis not present

## 2017-11-27 NOTE — Progress Notes (Signed)
VIALS EXP 11-28-18 

## 2017-12-02 DIAGNOSIS — R109 Unspecified abdominal pain: Secondary | ICD-10-CM | POA: Diagnosis not present

## 2017-12-02 DIAGNOSIS — R3915 Urgency of urination: Secondary | ICD-10-CM | POA: Diagnosis not present

## 2017-12-03 DIAGNOSIS — J3089 Other allergic rhinitis: Secondary | ICD-10-CM | POA: Diagnosis not present

## 2017-12-04 DIAGNOSIS — J3081 Allergic rhinitis due to animal (cat) (dog) hair and dander: Secondary | ICD-10-CM | POA: Diagnosis not present

## 2017-12-08 IMAGING — CR DG CHEST 2V
2 series · 2 of 2 positions shown · non-contrast
Comparison: 08/24/2015

CLINICAL DATA: Cough for several weeks

EXAM:
CHEST  2 VIEW

[w chest lat]
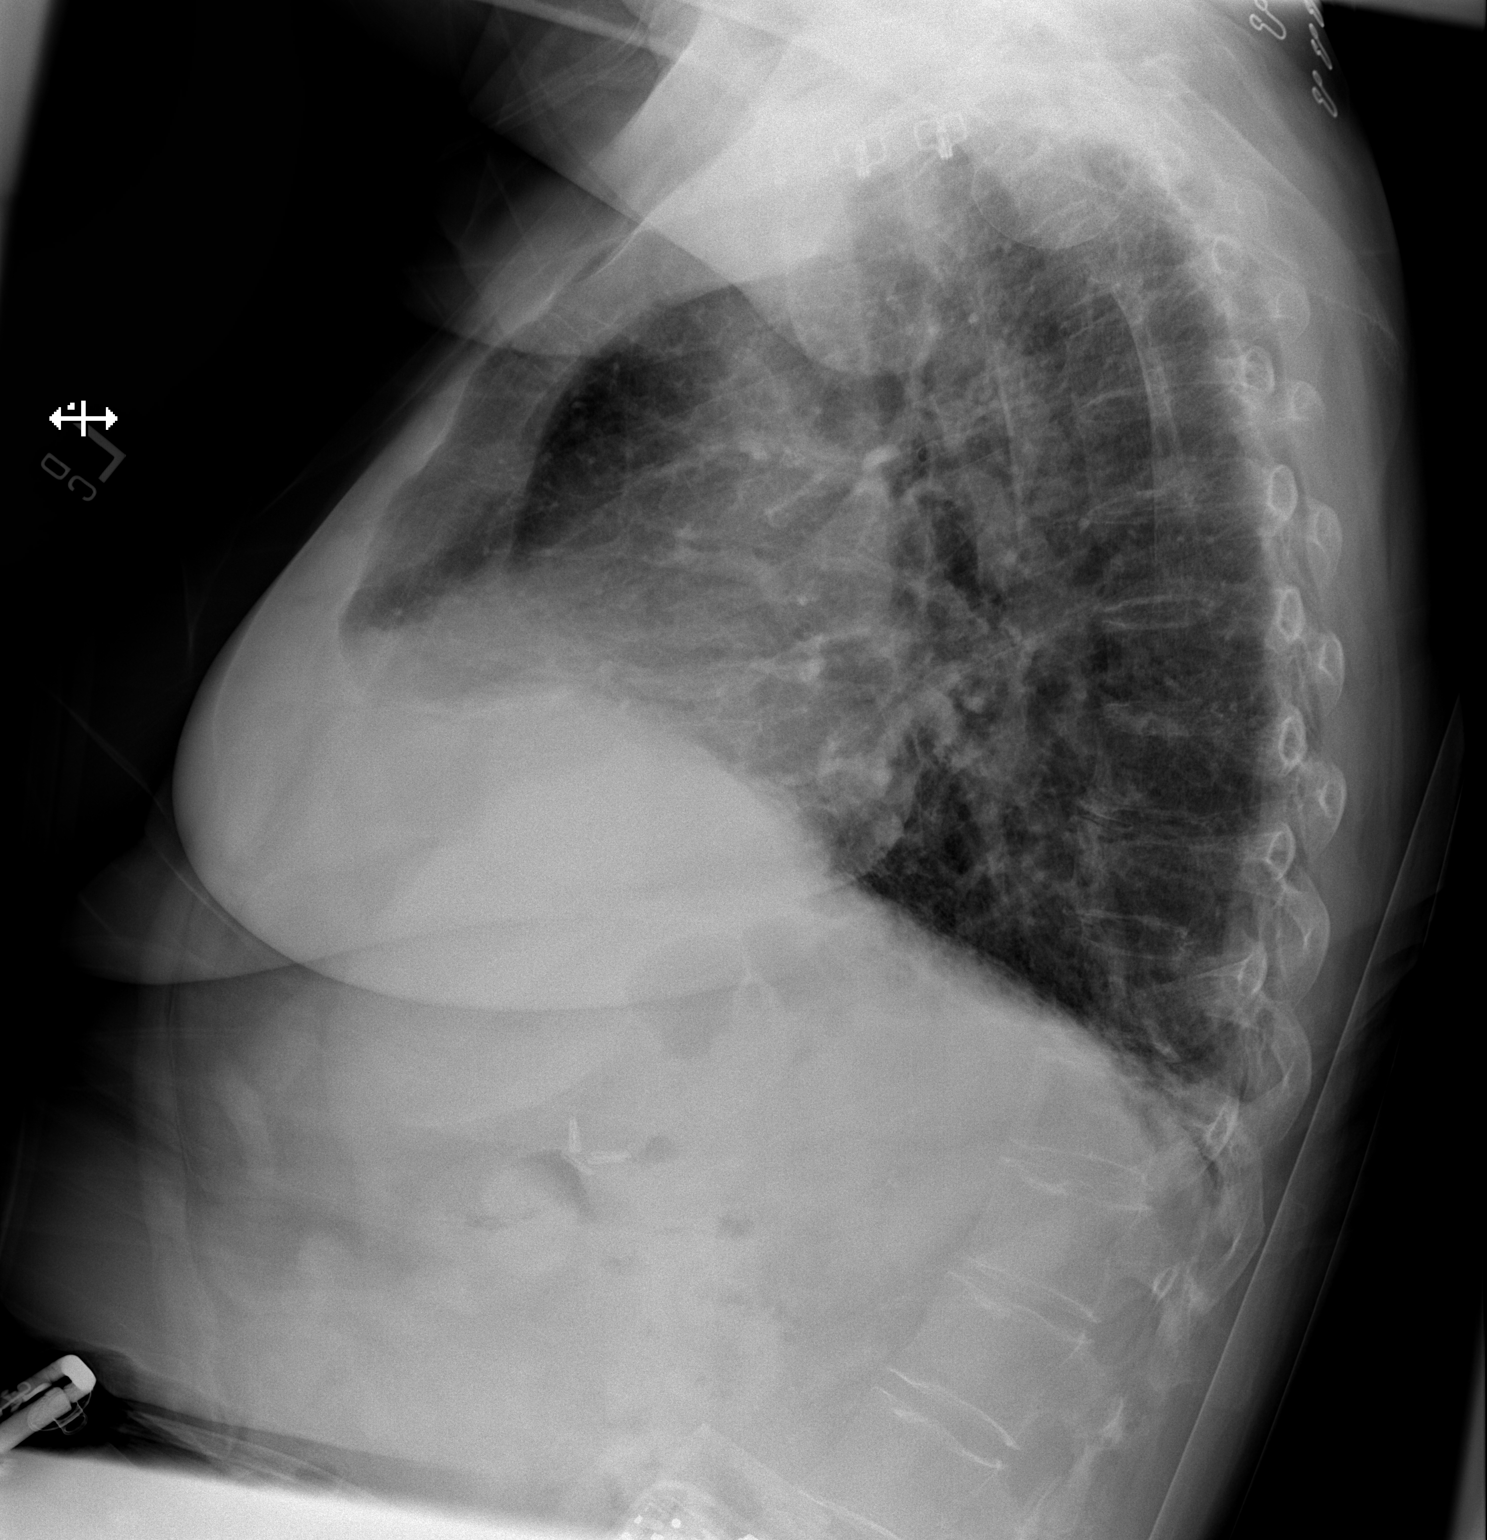

[x chest ap]
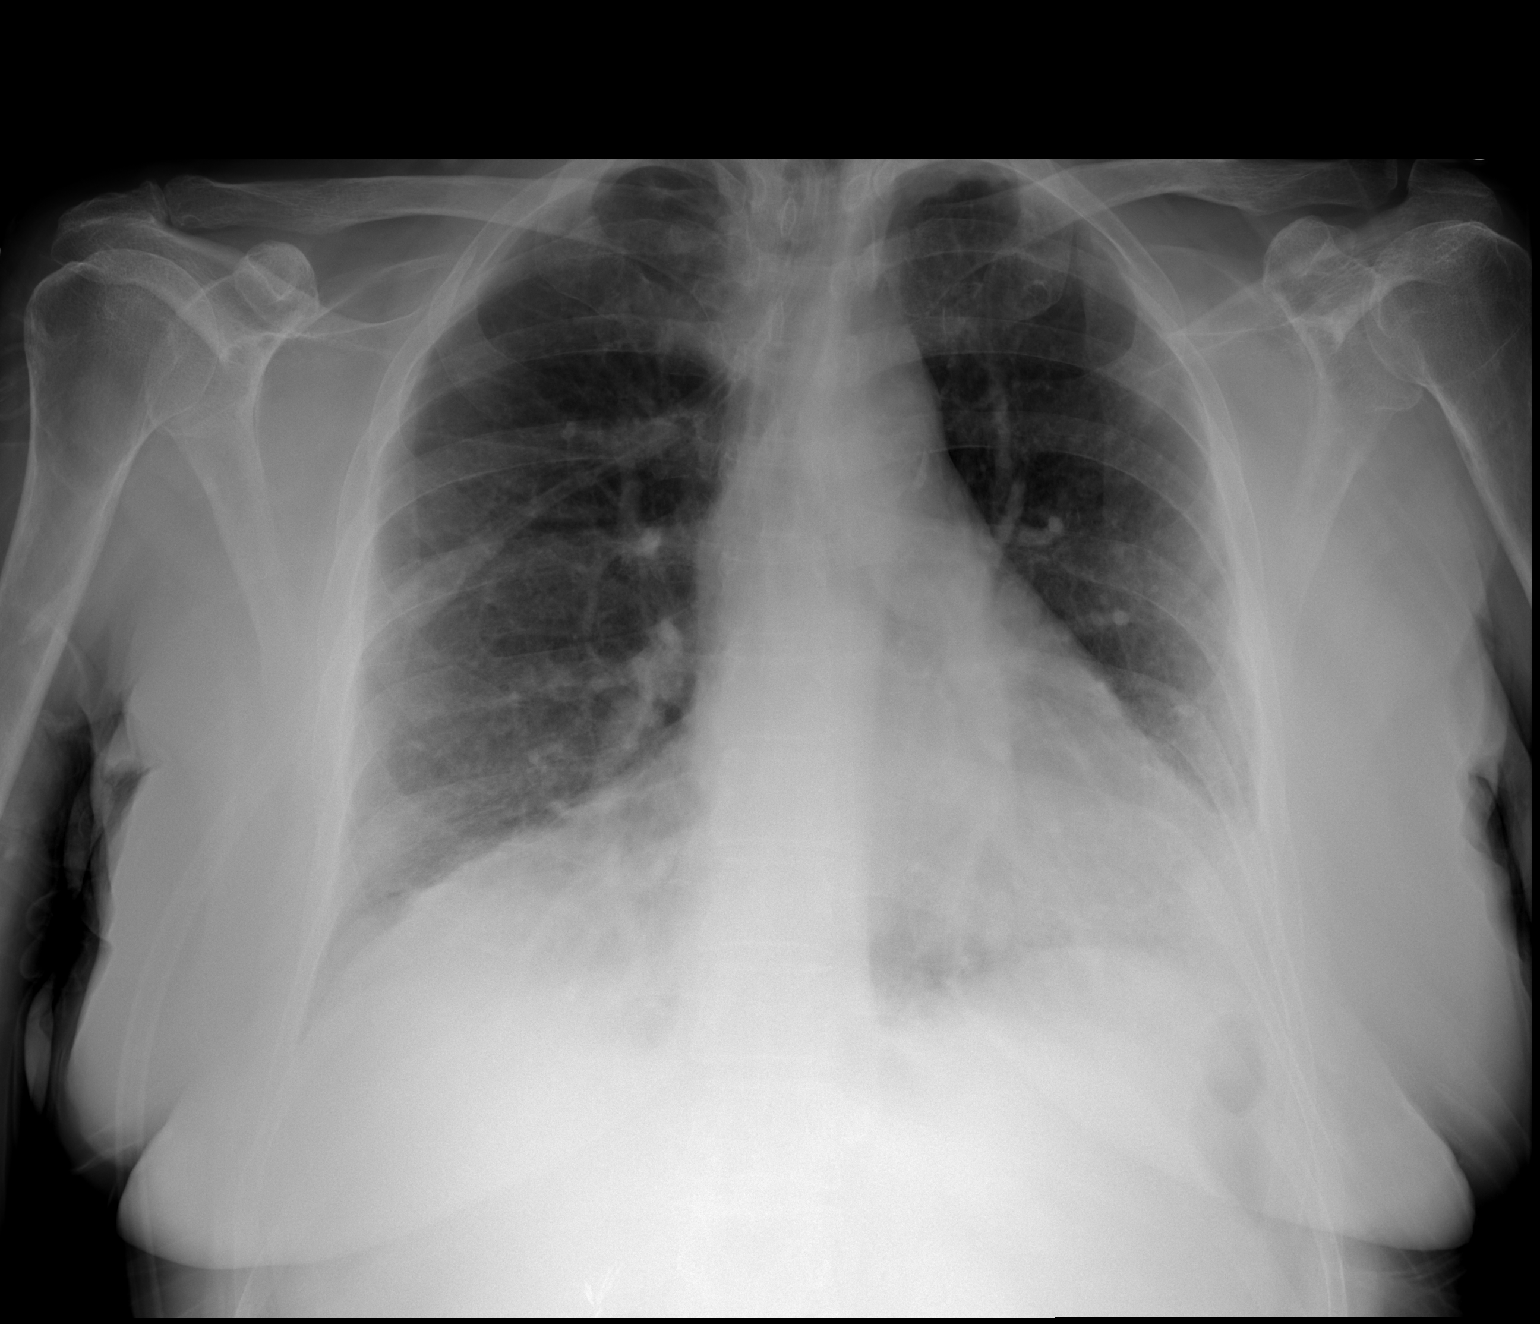

[2 of 2 positions shown; findings below may reference images not displayed]

FINDINGS: Cardiac shadow is stable. The lungs are well aerated bilaterally.
Minimal basilar atelectatic changes are seen. No focal infiltrate is
seen. Prominent epicardial fat pad is noted on the right stable from
the prior exam. No bony abnormality is seen.
IMPRESSION: Mild bibasilar atelectasis without focal confluent infiltrate.

## 2017-12-10 ENCOUNTER — Ambulatory Visit (INDEPENDENT_AMBULATORY_CARE_PROVIDER_SITE_OTHER): Payer: Medicare Other | Admitting: *Deleted

## 2017-12-10 DIAGNOSIS — G43719 Chronic migraine without aura, intractable, without status migrainosus: Secondary | ICD-10-CM | POA: Diagnosis not present

## 2017-12-10 DIAGNOSIS — J309 Allergic rhinitis, unspecified: Secondary | ICD-10-CM

## 2017-12-10 DIAGNOSIS — M542 Cervicalgia: Secondary | ICD-10-CM | POA: Diagnosis not present

## 2017-12-10 DIAGNOSIS — M791 Myalgia, unspecified site: Secondary | ICD-10-CM | POA: Diagnosis not present

## 2017-12-15 ENCOUNTER — Other Ambulatory Visit: Payer: Self-pay | Admitting: Internal Medicine

## 2017-12-15 ENCOUNTER — Encounter: Payer: Self-pay | Admitting: Internal Medicine

## 2017-12-15 ENCOUNTER — Ambulatory Visit (INDEPENDENT_AMBULATORY_CARE_PROVIDER_SITE_OTHER): Payer: Medicare Other | Admitting: Internal Medicine

## 2017-12-15 ENCOUNTER — Other Ambulatory Visit (INDEPENDENT_AMBULATORY_CARE_PROVIDER_SITE_OTHER): Payer: Medicare Other

## 2017-12-15 VITALS — BP 116/74 | HR 90 | Temp 98.4°F | Ht 62.0 in | Wt 141.0 lb

## 2017-12-15 DIAGNOSIS — Z0001 Encounter for general adult medical examination with abnormal findings: Secondary | ICD-10-CM

## 2017-12-15 DIAGNOSIS — G8929 Other chronic pain: Secondary | ICD-10-CM

## 2017-12-15 DIAGNOSIS — M62830 Muscle spasm of back: Secondary | ICD-10-CM | POA: Diagnosis not present

## 2017-12-15 DIAGNOSIS — M5416 Radiculopathy, lumbar region: Secondary | ICD-10-CM | POA: Diagnosis not present

## 2017-12-15 DIAGNOSIS — J449 Chronic obstructive pulmonary disease, unspecified: Secondary | ICD-10-CM | POA: Diagnosis not present

## 2017-12-15 DIAGNOSIS — M5441 Lumbago with sciatica, right side: Secondary | ICD-10-CM | POA: Diagnosis not present

## 2017-12-15 DIAGNOSIS — F411 Generalized anxiety disorder: Secondary | ICD-10-CM | POA: Diagnosis not present

## 2017-12-15 DIAGNOSIS — R030 Elevated blood-pressure reading, without diagnosis of hypertension: Secondary | ICD-10-CM | POA: Diagnosis not present

## 2017-12-15 LAB — CBC WITH DIFFERENTIAL/PLATELET
BASOS ABS: 0.1 10*3/uL (ref 0.0–0.1)
BASOS PCT: 1.1 % (ref 0.0–3.0)
EOS ABS: 0.3 10*3/uL (ref 0.0–0.7)
Eosinophils Relative: 3.3 % (ref 0.0–5.0)
HCT: 36.3 % (ref 36.0–46.0)
Hemoglobin: 12.2 g/dL (ref 12.0–15.0)
LYMPHS ABS: 3.5 10*3/uL (ref 0.7–4.0)
Lymphocytes Relative: 39.6 % (ref 12.0–46.0)
MCHC: 33.6 g/dL (ref 30.0–36.0)
MCV: 80 fl (ref 78.0–100.0)
MONO ABS: 0.6 10*3/uL (ref 0.1–1.0)
Monocytes Relative: 6.8 % (ref 3.0–12.0)
NEUTROS ABS: 4.4 10*3/uL (ref 1.4–7.7)
NEUTROS PCT: 49.2 % (ref 43.0–77.0)
PLATELETS: 371 10*3/uL (ref 150.0–400.0)
RBC: 4.54 Mil/uL (ref 3.87–5.11)
RDW: 15.1 % (ref 11.5–15.5)
WBC: 8.9 10*3/uL (ref 4.0–10.5)

## 2017-12-15 LAB — URINALYSIS, ROUTINE W REFLEX MICROSCOPIC
BILIRUBIN URINE: NEGATIVE
Hgb urine dipstick: NEGATIVE
KETONES UR: NEGATIVE
Leukocytes, UA: NEGATIVE
NITRITE: NEGATIVE
PH: 5.5 (ref 5.0–8.0)
RBC / HPF: NONE SEEN (ref 0–?)
SPECIFIC GRAVITY, URINE: 1.025 (ref 1.000–1.030)
Total Protein, Urine: NEGATIVE
URINE GLUCOSE: NEGATIVE
UROBILINOGEN UA: 0.2 (ref 0.0–1.0)

## 2017-12-15 LAB — BASIC METABOLIC PANEL
BUN: 11 mg/dL (ref 6–23)
CHLORIDE: 106 meq/L (ref 96–112)
CO2: 23 meq/L (ref 19–32)
CREATININE: 0.9 mg/dL (ref 0.40–1.20)
Calcium: 9.2 mg/dL (ref 8.4–10.5)
GFR: 67.58 mL/min (ref >60.00)
Glucose, Bld: 120 mg/dL — ABNORMAL HIGH (ref 70–99)
POTASSIUM: 4 meq/L (ref 3.5–5.1)
Sodium: 138 mEq/L (ref 135–145)

## 2017-12-15 LAB — TSH: TSH: 3.48 u[IU]/mL (ref 0.35–4.50)

## 2017-12-15 LAB — HEPATIC FUNCTION PANEL
ALK PHOS: 85 U/L (ref 39–117)
ALT: 10 U/L (ref 0–35)
AST: 12 U/L (ref 0–37)
Albumin: 4.2 g/dL (ref 3.5–5.2)
BILIRUBIN DIRECT: 0 mg/dL (ref 0.0–0.3)
BILIRUBIN TOTAL: 0.4 mg/dL (ref 0.2–1.2)
Total Protein: 6.9 g/dL (ref 6.0–8.3)

## 2017-12-15 LAB — LIPID PANEL
CHOL/HDL RATIO: 8
Cholesterol: 260 mg/dL — ABNORMAL HIGH (ref 0–200)
HDL: 31.5 mg/dL — ABNORMAL LOW (ref >39.00)
Triglycerides: 505 mg/dL — ABNORMAL HIGH (ref 0.0–149.0)

## 2017-12-15 LAB — LDL CHOLESTEROL, DIRECT: LDL DIRECT: 161 mg/dL

## 2017-12-15 MED ORDER — CYCLOBENZAPRINE HCL 5 MG PO TABS: 5.0000 mg | ORAL_TABLET | Freq: Three times a day (TID) | ORAL | 1 refills | Status: DC | PRN

## 2017-12-15 MED ORDER — OXYCODONE HCL 5 MG PO TABS: 5.0000 mg | ORAL_TABLET | Freq: Four times a day (QID) | ORAL | 0 refills | Status: DC | PRN

## 2017-12-15 MED ORDER — KETOROLAC TROMETHAMINE 30 MG/ML IJ SOLN
30.0000 mg | Freq: Once | INTRAMUSCULAR | Status: AC
  Administered 2017-12-15: 30 mg via INTRAMUSCULAR

## 2017-12-15 MED ORDER — ROSUVASTATIN CALCIUM 40 MG PO TABS: 40.0000 mg | ORAL_TABLET | Freq: Every day | ORAL | 3 refills | Status: DC

## 2017-12-15 MED ORDER — ALPRAZOLAM 1 MG PO TABS: 1.0000 mg | ORAL_TABLET | Freq: Every day | ORAL | 2 refills | Status: DC | PRN

## 2017-12-15 NOTE — Progress Notes (Signed)
Subjective:    Patient ID: Lori [name], female    DOB: August 28, 1956, 62 y.o.   MRN: [id number]  HPI  Here for wellness and f/u;  Overall doing ok;  Pt denies Chest pain, worsening SOB, DOE, wheezing, orthopnea, PND, worsening LE edema, palpitations, dizziness or syncope.  Pt denies neurological change such as new headache, facial or extremity weakness.  Pt denies polydipsia, polyuria, or low sugar symptoms. Pt states overall good compliance with treatment and medications, good tolerability, and has been trying to follow appropriate diet.  Pt denies worsening depressive symptoms, suicidal ideation or panic. No fever, night sweats, wt loss, loss of appetite, or other constitutional symptoms.  Pt states good ability with ADL's, has low fall risk, home safety reviewed and adequate, no other significant changes in hearing or vision, and only occasionally active with exercise. Also with c/o 4-5 months LBP, has known lumbar DDD with L5 nerve root involvement; gabapentin 200 qhs did not help per pt and refuses further; has seen sports medicine, then NS  - no surgury recommended, but did tried PT once but hurt worse so stopped.  Now pain 9/10 with some radiation to the right buttock and post right leg to just below the knee.  No LE weakness though holds back due to pain.  No falls.  Has multiple pain med intolerances listed, no rash with nsaids and has not tried oxycodone.  Denies worsening depressive symptoms, suicidal ideation, or panic; has ongoing anxiety, now worse, asks for xanax restart 1 mg qd prn.   Past Medical History:  Diagnosis Date  . Acute MI ([place])    x3 - by report only. She has had 3 negative Myoview stress test with no evidence of prior infarct.  . Allergic rhinitis   . Allergy   . Anemia   . Ankle fracture 05/2016  . Anxiety   . Arthritis   . Asthma   . Barrett esophagus 2007  . Chest pain 05-02-2009   echo  EF 55%  . COPD (chronic obstructive pulmonary disease) with chronic  bronchitis ([facility])    & Emphysema  . Depression   . Duodenitis without mention of hemorrhage 2007  . Esophageal reflux 2007  . Esophageal stricture   . Full dentures   . H/O hiatal hernia   . Hiatal hernia [id number]  . Hyperlipidemia   . Multiple fractures    from falls, fx rt. elbow, fx left wrist, bilateral ankles  . Pneumonia 10/2016  . Restrictive lung disease 06/30/2017  . Ulcer    Past Surgical History:  Procedure Laterality Date  . ABDOMINAL HYSTERECTOMY     BSO  . APPENDECTOMY    . CESAREAN SECTION     x 2  . CHOLECYSTECTOMY    . CHONDROPLASTY Left 01/06/2015   Procedure: CHONDROPLASTY;  Surgeon: Marybelle Killings, MD;  Location: [facility];  Service: Orthopedics;  Laterality: Left;  . COLONOSCOPY    . ELBOW FRACTURE SURGERY     right  . KNEE ARTHROSCOPY WITH EXCISION PLICA Left 9/47/6546   Procedure: KNEE ARTHROSCOPY WITH EXCISION PLICA;  Surgeon: Marybelle Killings, MD;  Location: [facility];  Service: Orthopedics;  Laterality: Left;  . Lower Extremity Arterial Dopplers  07/06/2013   RABI 1.0, LABI 1.1.; No evidence of significant vascular atherogenic plaque  . NECK SURGERY    . NM MYOVIEW LTD  09/2014   LOW RISK. Normal EF of 65% with no regional wall motion abnormalities. No ischemia  or infarction.  . TONSILLECTOMY    . WRIST FRACTURE SURGERY     left, has plate    reports that she has quit smoking. Her smoking use included cigarettes. She started smoking about 40 years ago. She has a 39.00 pack-year smoking history. She has never used smokeless tobacco. She reports that she does not drink alcohol or use drugs. family history includes Dementia in her mother; Diabetes in her maternal aunt, maternal grandmother, and unknown relative; Emphysema in her father; Heart disease in her brother. Allergies  Allergen Reactions  . Bee Venom Swelling  . Codeine Nausea Only and Other (See Comments)    CAUSES ULCERS  . Ibuprofen Nausea And Vomiting  .  Tramadol Nausea Only  . Hydrocodone-Acetaminophen Nausea Only  . Latex Rash  . Levofloxacin Nausea Only  . Propoxyphene N-Acetaminophen Nausea Only   Current Outpatient Medications on File Prior to Visit  Medication Sig Dispense Refill  . aspirin 81 MG EC tablet Take 1 tablet (81 mg total) by mouth daily. Swallow whole. 30 tablet 12  . BOTOX 100 units SOLR injection     . DEXILANT 60 MG capsule Take 1 capsule (60 mg total) by mouth daily. 30 capsule 5  . montelukast (SINGULAIR) 10 MG tablet Take 1 tablet (10 mg total) by mouth at bedtime. 30 tablet 5  . nitroGLYCERIN (NITROSTAT) 0.3 MG SL tablet Place 0.3 mg under the tongue every 5 (five) minutes as needed for chest pain.    . NONFORMULARY OR COMPOUNDED ITEM GI COCKTAIL 39ml 2 % lidocaine 14ml Dicyclomine 10mg /51ml 28ml Miralax   TAKE 1-2 TIMES DAILY AS NEEDED 450 each 0  . PROAIR HFA 108 (90 Base) MCG/ACT inhaler Inhale 1-2 puffs into the lungs every 4 (four) hours as needed for shortness of breath or wheezing. 1 Inhaler 1  . valACYclovir (VALTREX) 500 MG tablet TAKE 1 TABLET(500 MG) BY MOUTH DAILY 30 tablet 11   No current facility-administered medications on file prior to visit.    Review of Systems Constitutional: Negative for other unusual diaphoresis, sweats, appetite or weight changes HENT: Negative for other worsening hearing loss, ear pain, facial swelling, mouth sores or neck stiffness.   Eyes: Negative for other worsening pain, redness or other visual disturbance.  Respiratory: Negative for other stridor or swelling Cardiovascular: Negative for other palpitations or other chest pain  Gastrointestinal: Negative for worsening diarrhea or loose stools, blood in stool, distention or other pain Genitourinary: Negative for hematuria, flank pain or other change in urine volume.  Musculoskeletal: Negative for myalgias or other joint swelling.  Skin: Negative for other color change, or other wound or worsening drainage.    Neurological: Negative for other syncope or numbness. Hematological: Negative for other adenopathy or swelling Psychiatric/Behavioral: Negative for hallucinations, other worsening agitation, SI, self-injury, or new decreased concentration All other system neg per pt    Objective:   Physical Exam BP 116/74   Pulse 90   Temp 98.4 F (36.9 C) (Oral)   Ht 5\' 2"  (1.575 m)   Wt 141 lb (64 kg)   SpO2 98%   BMI 25.79 kg/m  VS noted,  Constitutional: Pt is oriented to person, place, and time. Appears well-developed and well-nourished, in no significant distress and comfortable Head: Normocephalic and atraumatic  Eyes: Conjunctivae and EOM are normal. Pupils are equal, round, and reactive to light Right Ear: External ear normal without discharge Left Ear: External ear normal without discharge Nose: Nose without discharge or deformity Mouth/Throat: Oropharynx is without  other ulcerations and moist  Neck: Normal range of motion. Neck supple. No JVD present. No tracheal deviation present or significant neck LA or mass Cardiovascular: Normal rate, regular rhythm, normal heart sounds and intact distal pulses.   Pulmonary/Chest: WOB normal and breath sounds without rales or wheezing  Abdominal: Soft. Bowel sounds are normal. NT. No HSM  Musculoskeletal: Normal range of motion. Exhibits no edema Spine: tender midline lower lumbar and right lumbar paravertebral spasm tenderness without swelling or rash Lymphadenopathy: Has no other cervical adenopathy.  Neurological: Pt is alert and oriented to person, place, and time. Pt has normal reflexes. No cranial nerve deficit. Motor grossly intact, Gait intact Skin: Skin is warm and dry. No rash noted or new ulcerations Psychiatric:  Has normal mood and affect. Behavior is normal without agitation No other exam findings    Assessment & Plan:

## 2017-12-15 NOTE — Patient Instructions (Addendum)
You had the pain shot today (toradol)  Please take all new medication as prescribed - the oxycodone for pain, muscle relaxer as needed  Please continue all other medications as before, and refills have been done if requested - the xanax  Please have the pharmacy call with any other refills you may need.  Please continue your efforts at being more active, low cholesterol diet, and weight control.  You are otherwise up to date with prevention measures today.  You will be contacted regarding the referral for: Pain Management  Please keep your appointments with your specialists as you may have planned  Please go to the LAB in the Basement (turn left off the elevator) for the tests to be done today  You will be contacted by phone if any changes need to be made immediately.  Otherwise, you will receive a letter about your results with an explanation, but please check with MyChart first.  Please remember to sign up for MyChart if you have not done so, as this will be important to you in the future with finding out test results, communicating by private email, and scheduling acute appointments online when needed.  Please return in 6 months, or sooner if needed

## 2017-12-16 ENCOUNTER — Encounter: Payer: Self-pay | Admitting: Internal Medicine

## 2017-12-16 ENCOUNTER — Telehealth: Payer: Self-pay

## 2017-12-16 NOTE — Assessment & Plan Note (Addendum)
Also consider toradol 30 IM x 1, refer pain management

## 2017-12-16 NOTE — Assessment & Plan Note (Signed)

## 2017-12-16 NOTE — Assessment & Plan Note (Signed)
For xanax refill,  to f/u any worsening symptoms or concerns 

## 2017-12-16 NOTE — Assessment & Plan Note (Signed)
For pain control,  to f/u any worsening symptoms or concerns 

## 2017-12-16 NOTE — Assessment & Plan Note (Addendum)
For muscle relaxer prn,  to f/u any worsening symptoms or concerns  In addition to the time spent performing CPE, I spent an additional 25 minutes face to face,in which greater than 50% of this time was spent in counseling and coordination of care for patient's acute illness as documented, including the differential dx, treatment, further evaluation and other management of muscle spasm, chronic LBP, lumbar radiculitis, COPD, anxiety, HTN

## 2017-12-16 NOTE — Assessment & Plan Note (Signed)
stable overall by history and exam, and pt to continue medical treatment as before,  to f/u any worsening symptoms or concerns 

## 2017-12-16 NOTE — Telephone Encounter (Signed)
-----   Message from Biagio Borg, MD sent at 12/15/2017  8:42 PM EDT ----- Letter sent, cont same tx except  The test results show that your current treatment is OK, except the cholesterol is still quite high.  We should start a medication called Crestor that is now generic and will reduce your risk of heart disease and stroke in the future.    Jaxon Flatt to please inform pt, I will do rx

## 2017-12-16 NOTE — Telephone Encounter (Signed)
Called pt, LVM.   CRM created.  

## 2017-12-16 NOTE — Assessment & Plan Note (Signed)
BP Readings from Last 3 Encounters:  12/15/17 116/74  11/19/17 130/80  11/10/17 132/80  stable overall by history and exam, recent data reviewed with pt, and pt to continue medical treatment as before,  to f/u any worsening symptoms or concerns

## 2017-12-20 ENCOUNTER — Encounter: Payer: Self-pay | Admitting: Family Medicine

## 2017-12-20 ENCOUNTER — Ambulatory Visit (INDEPENDENT_AMBULATORY_CARE_PROVIDER_SITE_OTHER): Payer: Medicare Other | Admitting: Family Medicine

## 2017-12-20 VITALS — BP 120/78 | HR 103 | Temp 98.5°F | Wt 147.0 lb

## 2017-12-20 DIAGNOSIS — J441 Chronic obstructive pulmonary disease with (acute) exacerbation: Secondary | ICD-10-CM

## 2017-12-20 MED ORDER — PROMETHAZINE-DM 6.25-15 MG/5ML PO SYRP: 5.0000 mL | ORAL_SOLUTION | Freq: Four times a day (QID) | ORAL | 0 refills | Status: DC | PRN

## 2017-12-20 MED ORDER — PREDNISONE 10 MG PO TABS: ORAL_TABLET | ORAL | 0 refills | Status: DC

## 2017-12-20 MED ORDER — IPRATROPIUM-ALBUTEROL 0.5-2.5 (3) MG/3ML IN SOLN
3.0000 mL | Freq: Once | RESPIRATORY_TRACT | Status: AC
Start: 2017-12-20 — End: 2017-12-20
  Administered 2017-12-20: 3 mL via RESPIRATORY_TRACT

## 2017-12-20 MED ORDER — DOXYCYCLINE HYCLATE 100 MG PO TABS: 100.0000 mg | ORAL_TABLET | Freq: Two times a day (BID) | ORAL | 0 refills | Status: DC

## 2017-12-20 NOTE — Patient Instructions (Signed)
Follow up as needed or as scheduled START the Doxycycline twice daily- take w/ food START the Prednisone as directed- 3 tabs at the same time for 3 days and so on (take w/ food) USE the cough syrup as needed- may cause drowsiness Use your inhaler as needed Call with any questions or concerns Hang in there!!!

## 2017-12-20 NOTE — Progress Notes (Signed)
   Subjective:    Patient ID: Lori [name], female    DOB: 12/02/1955, 62 y.o.   MRN: [id number]  HPI Cough- wet but not productive.  Now having chest discomfort from all of the coughing.  sxs started 'a couple days ago'.  No fevers.  + wheezing.  Using Albuterol w/o relief.  Not using any cough medication.  Hx of seasonal allergies and COPD.   Review of Systems For ROS see HPI     Objective:   Physical Exam  Constitutional: She appears well-developed and well-nourished. No distress.  HENT:  Head: Normocephalic and atraumatic.  TMs normal bilaterally Mild nasal congestion Throat w/out erythema, edema, or exudate  Eyes: Pupils are equal, round, and reactive to light. Conjunctivae and EOM are normal.  Neck: Normal range of motion. Neck supple.  Cardiovascular: Normal rate, regular rhythm, normal heart sounds and intact distal pulses.  No murmur heard. Pulmonary/Chest: Effort normal. No respiratory distress. She has wheezes (diffuse inspiratory and expiratory wheezes- improved s/p neb tx).  + hacking cough  Lymphadenopathy:    She has no cervical adenopathy.  Vitals reviewed.         Assessment & Plan:  COPD exacerbation- new to provider, pt has hx of similar.  Initial wheezing was impressive but this improved s/p neb tx.  Start Doxy, prednisone, cough syrup, and encouraged inhaler use prn.  Discussed controller inhaler but pt states, 'none of those work for me'.  Reviewed supportive care and red flags that should prompt return.  Pt expressed understanding and is in agreement w/ plan.

## 2017-12-23 ENCOUNTER — Telehealth: Payer: Self-pay | Admitting: Allergy & Immunology

## 2017-12-23 ENCOUNTER — Ambulatory Visit (INDEPENDENT_AMBULATORY_CARE_PROVIDER_SITE_OTHER): Payer: Medicare Other | Admitting: *Deleted

## 2017-12-23 DIAGNOSIS — J309 Allergic rhinitis, unspecified: Secondary | ICD-10-CM

## 2017-12-23 MED ORDER — CETIRIZINE HCL 10 MG PO TABS: 10.0000 mg | ORAL_TABLET | Freq: Every day | ORAL | 5 refills | Status: DC

## 2017-12-23 NOTE — Telephone Encounter (Signed)
Dr. Gallagher any recommendations.  

## 2017-12-23 NOTE — Addendum Note (Signed)
Addended by: Lucrezia Starch I on: 12/23/2017 03:00 PM   Modules accepted: Orders

## 2017-12-23 NOTE — Telephone Encounter (Signed)
Left message for patient advising her of the refill on cetirizine.

## 2017-12-23 NOTE — Telephone Encounter (Signed)
Per the last note, she is supposed to be on cetirizine 10mg  daily anyway. We can send that in again.   Salvatore Marvel, MD [facility]

## 2017-12-23 NOTE — Telephone Encounter (Signed)
Pt said the inhalers make her breathing worse and wants to be put on allergy pills for her breathing Lori [name] and Lori [name]. [phone].

## 2017-12-29 ENCOUNTER — Emergency Department (HOSPITAL_COMMUNITY)
Admission: EM | Admit: 2017-12-29 | Discharge: 2017-12-29 | Disposition: A | Discharge status: Home / Self Care | Payer: Medicare Other | Attending: Emergency Medicine | Admitting: Emergency Medicine

## 2017-12-29 ENCOUNTER — Other Ambulatory Visit: Payer: Self-pay

## 2017-12-29 ENCOUNTER — Emergency Department (HOSPITAL_COMMUNITY): Payer: Medicare Other

## 2017-12-29 ENCOUNTER — Encounter (HOSPITAL_COMMUNITY): Payer: Self-pay | Admitting: Nurse Practitioner

## 2017-12-29 DIAGNOSIS — Z79899 Other long term (current) drug therapy: Secondary | ICD-10-CM | POA: Diagnosis not present

## 2017-12-29 DIAGNOSIS — D649 Anemia, unspecified: Secondary | ICD-10-CM | POA: Diagnosis not present

## 2017-12-29 DIAGNOSIS — K219 Gastro-esophageal reflux disease without esophagitis: Secondary | ICD-10-CM | POA: Insufficient documentation

## 2017-12-29 DIAGNOSIS — M549 Dorsalgia, unspecified: Secondary | ICD-10-CM | POA: Diagnosis not present

## 2017-12-29 DIAGNOSIS — R51 Headache: Secondary | ICD-10-CM | POA: Diagnosis not present

## 2017-12-29 DIAGNOSIS — R110 Nausea: Secondary | ICD-10-CM | POA: Diagnosis not present

## 2017-12-29 DIAGNOSIS — R531 Weakness: Secondary | ICD-10-CM | POA: Diagnosis not present

## 2017-12-29 DIAGNOSIS — R404 Transient alteration of awareness: Secondary | ICD-10-CM | POA: Diagnosis not present

## 2017-12-29 DIAGNOSIS — Z87891 Personal history of nicotine dependence: Secondary | ICD-10-CM | POA: Diagnosis not present

## 2017-12-29 DIAGNOSIS — R112 Nausea with vomiting, unspecified: Secondary | ICD-10-CM | POA: Insufficient documentation

## 2017-12-29 DIAGNOSIS — R55 Syncope and collapse: Secondary | ICD-10-CM | POA: Diagnosis not present

## 2017-12-29 DIAGNOSIS — R05 Cough: Secondary | ICD-10-CM | POA: Diagnosis not present

## 2017-12-29 DIAGNOSIS — G8929 Other chronic pain: Secondary | ICD-10-CM | POA: Insufficient documentation

## 2017-12-29 DIAGNOSIS — R42 Dizziness and giddiness: Secondary | ICD-10-CM | POA: Insufficient documentation

## 2017-12-29 DIAGNOSIS — J4540 Moderate persistent asthma, uncomplicated: Secondary | ICD-10-CM | POA: Diagnosis not present

## 2017-12-29 DIAGNOSIS — Z91040 Latex allergy status: Secondary | ICD-10-CM | POA: Diagnosis not present

## 2017-12-29 DIAGNOSIS — Z7982 Long term (current) use of aspirin: Secondary | ICD-10-CM | POA: Diagnosis not present

## 2017-12-29 LAB — COMPREHENSIVE METABOLIC PANEL
ALBUMIN: 3.8 g/dL (ref 3.5–5.0)
ALT: 15 U/L (ref 14–54)
AST: 16 U/L (ref 15–41)
Alkaline Phosphatase: 80 U/L (ref 38–126)
Anion gap: 8 (ref 5–15)
BUN: 8 mg/dL (ref 6–20)
CHLORIDE: 107 mmol/L (ref 101–111)
CO2: 23 mmol/L (ref 22–32)
CREATININE: 0.96 mg/dL (ref 0.44–1.00)
Calcium: 8.8 mg/dL — ABNORMAL LOW (ref 8.9–10.3)
GFR calc Af Amer: >60 mL/min (ref >60)
GFR calc non Af Amer: >60 mL/min (ref >60)
GLUCOSE: 108 mg/dL — AB (ref 65–99)
Potassium: 4.2 mmol/L (ref 3.5–5.1)
SODIUM: 138 mmol/L (ref 135–145)
Total Bilirubin: 0.4 mg/dL (ref 0.3–1.2)
Total Protein: 6.8 g/dL (ref 6.5–8.1)

## 2017-12-29 LAB — CBC WITH DIFFERENTIAL/PLATELET
BASOS ABS: 0 10*3/uL (ref 0.0–0.1)
BASOS PCT: 0 %
EOS ABS: 0.3 10*3/uL (ref 0.0–0.7)
EOS PCT: 3 %
HCT: 34.9 % — ABNORMAL LOW (ref 36.0–46.0)
Hemoglobin: 11.1 g/dL — ABNORMAL LOW (ref 12.0–15.0)
Lymphocytes Relative: 40 %
Lymphs Abs: 3.1 10*3/uL (ref 0.7–4.0)
MCH: 26.4 pg (ref 26.0–34.0)
MCHC: 31.8 g/dL (ref 30.0–36.0)
MCV: 82.9 fL (ref 78.0–100.0)
Monocytes Absolute: 0.5 10*3/uL (ref 0.1–1.0)
Monocytes Relative: 7 %
Neutro Abs: 3.9 10*3/uL (ref 1.7–7.7)
Neutrophils Relative %: 50 %
PLATELETS: 295 10*3/uL (ref 150–400)
RBC: 4.21 MIL/uL (ref 3.87–5.11)
RDW: 14.9 % (ref 11.5–15.5)
WBC: 7.7 10*3/uL (ref 4.0–10.5)

## 2017-12-29 LAB — LIPASE, BLOOD: Lipase: 24 U/L (ref 11–51)

## 2017-12-29 MED ORDER — SODIUM CHLORIDE 0.9 % IV BOLUS
1000.0000 mL | Freq: Once | INTRAVENOUS | Status: AC
  Administered 2017-12-29: 1000 mL via INTRAVENOUS

## 2017-12-29 MED ORDER — PANTOPRAZOLE SODIUM 20 MG PO TBEC: 20.0000 mg | DELAYED_RELEASE_TABLET | Freq: Every day | ORAL | 0 refills | Status: DC

## 2017-12-29 MED ORDER — ONDANSETRON 8 MG PO TBDP: 8.0000 mg | ORAL_TABLET | Freq: Three times a day (TID) | ORAL | 0 refills | Status: DC | PRN

## 2017-12-29 MED ORDER — ONDANSETRON HCL 4 MG/2ML IJ SOLN
4.0000 mg | Freq: Once | INTRAMUSCULAR | Status: AC
  Administered 2017-12-29: 4 mg via INTRAVENOUS
  Filled 2017-12-29: qty 2

## 2017-12-29 NOTE — Discharge Instructions (Addendum)
Follow-up with your primary care doctor to make sure you are improving, your blood test did show you are mildly anemic.  I do not think this is related to your symptoms but it would be a good idea to have this rechecked in a few weeks to a month.  Monitor for any signs of blood in your stool or in your vomit.  Take the antacid medications and nausea medications.  Return to the emergency room as needed for worsening symptoms

## 2017-12-29 NOTE — ED Triage Notes (Signed)
Patient brought in by EMS for Nausea and weakness that has been going on for 3 weeks. Patient was at her PCP last week and stated they gave her a back brace  And to drink fluid for her nausea. Patient doesn't make sense is a bad historian. Patient denies fever or diarrhea.

## 2017-12-29 NOTE — ED Notes (Signed)
PT REMOVED HER OWN BACK BRACE.

## 2017-12-29 NOTE — ED Notes (Signed)
Patient transported to X-ray 

## 2017-12-29 NOTE — ED Notes (Signed)
RX X 2 GIVEN 

## 2017-12-29 NOTE — ED Provider Notes (Signed)
[facility] DEPT Provider Note   CSN: [id number] Arrival date & time: 12/29/17  0754     History   Chief Complaint Chief Complaint  Patient presents with  . Weakness  . Nausea    HPI Lori [name] is a 62 y.o. female.  HPI Pt states she has had trouble with weakness, nausea, syncope for the past month.  She has generalized weakness, constantly nauseous.  Off and on she will vomit and have trouble with nausea and not keep anything down.  This morning she vomited three times.   She felt a burning in her throat and chest.  No abdominal pain this morning but sometime she cramps in her abdomen.  She also has had fainting spells and she had an episode this am after she vomited.    She has several other chronic issues that bother her including back pain, she states she has a pinched nerve and is wearing a brace.  She has asthma and coughs a lot.    She saw her doctor twice last week.  She was seen on the 6th and was prescribed three medications, abx, cough med and prednisone.  She does not feel like she is getting any better. Past Medical History:  Diagnosis Date  . Acute MI ([place])    x3 - by report only. She has had 3 negative Myoview stress test with no evidence of prior infarct.  . Allergic rhinitis   . Allergy   . Anemia   . Ankle fracture 05/2016  . Anxiety   . Arthritis   . Asthma   . Barrett esophagus 2007  . Chest pain 05-02-2009   echo  EF 55%  . COPD (chronic obstructive pulmonary disease) with chronic bronchitis (HCC)    & Emphysema  . Depression   . Duodenitis without mention of hemorrhage 2007  . Esophageal reflux 2007  . Esophageal stricture   . Full dentures   . H/O hiatal hernia   . Hiatal hernia 2725,3664  . Hyperlipidemia   . Multiple fractures    from falls, fx rt. elbow, fx left wrist, bilateral ankles  . Pneumonia 10/2016  . Restrictive lung disease 06/30/2017  . Ulcer     Patient Active Problem List   Diagnosis  Date Noted  . Lumbar radiculitis 08/15/2017  . Degenerative disc disease, lumbar 07/21/2017  . Encounter for well adult exam with abnormal findings 06/30/2017  . Epigastric pain 06/30/2017  . Restrictive lung disease 06/30/2017  . Leg cramping 06/09/2017  . Elbow contusion 06/09/2017  . Right elbow pain 05/27/2017  . COPD mixed type ([place]) 01/31/2017  . Dizziness 01/20/2017  . Seasonal and perennial allergic rhinitis 01/01/2017  . Moderate persistent asthma, uncomplicated 40/34/7425  . Pulmonary emphysema ([place]) 12/03/2016  . Tobacco use disorder 12/03/2016  . Chronic seasonal allergic rhinitis 12/03/2016  . Medication management 11/29/2016  . Cough 11/23/2016  . Rash 11/23/2016  . Vertigo 10/22/2016  . Gastroesophageal reflux disease with esophagitis 10/22/2016  . Non compliance w medication regimen 08/01/2016  . Allergic contact dermatitis due to metals 08/01/2016  . Migraine without aura and without status migrainosus, not intractable 07/22/2016  . Spasm of muscle of lower back 06/21/2016  . Chronic rhinitis 06/19/2016  . Gait difficulty 06/21/2015  . Fibromyalgia 06/21/2015  . Conversion reaction 06/21/2015  . Depression 06/21/2015  . Syncope and collapse 06/21/2015  . Plica syndrome of left knee 01/06/2015  . Anxiety state 09/06/2014  . History of MI 09/06/2014  .  Cigarette smoker 06/27/2013  . Borderline hypertension 06/27/2013  . Chronic low back pain with right-sided sciatica 06/14/2013  . Leg pain, bilateral 06/14/2013  . Gastroparesis 10/26/2009  . Musculoskeletal chest pain 05/02/2009  . Hyperlipidemia LDL goal <100 11/18/2008  . HEPATIC CYST 11/18/2008    Past Surgical History:  Procedure Laterality Date  . ABDOMINAL HYSTERECTOMY     BSO  . APPENDECTOMY    . CESAREAN SECTION     x 2  . CHOLECYSTECTOMY    . CHONDROPLASTY Left 01/06/2015   Procedure: CHONDROPLASTY;  Surgeon: Marybelle Killings, MD;  Location: [facility];  Service: Orthopedics;   Laterality: Left;  . COLONOSCOPY    . ELBOW FRACTURE SURGERY     right  . KNEE ARTHROSCOPY WITH EXCISION PLICA Left 3/53/6144   Procedure: KNEE ARTHROSCOPY WITH EXCISION PLICA;  Surgeon: Marybelle Killings, MD;  Location: [facility];  Service: Orthopedics;  Laterality: Left;  . Lower Extremity Arterial Dopplers  07/06/2013   RABI 1.0, LABI 1.1.; No evidence of significant vascular atherogenic plaque  . NECK SURGERY    . NM MYOVIEW LTD  09/2014   LOW RISK. Normal EF of 65% with no regional wall motion abnormalities. No ischemia or infarction.  . TONSILLECTOMY    . WRIST FRACTURE SURGERY     left, has plate     OB History   None      Home Medications    Prior to Admission medications   Medication Sig Start Date End Date Taking? Authorizing Provider  ALPRAZolam Duanne Moron) 1 MG tablet Take 1 tablet (1 mg total) by mouth daily as needed for anxiety. 12/15/17   Biagio Borg, MD  aspirin 81 MG EC tablet Take 1 tablet (81 mg total) by mouth daily. Swallow whole. 06/30/17   Biagio Borg, MD  BOTOX 100 units SOLR injection  11/17/17   [provider]  cetirizine (ZYRTEC) 10 MG tablet Take 1 tablet (10 mg total) by mouth daily. 12/23/17   Valentina Shaggy, MD  cyclobenzaprine (FLEXERIL) 5 MG tablet Take 1 tablet (5 mg total) by mouth 3 (three) times daily as needed for muscle spasms. 12/15/17   Biagio Borg, MD  DEXILANT 60 MG capsule Take 1 capsule (60 mg total) by mouth daily. 10/15/17   Biagio Borg, MD  doxycycline (VIBRA-TABS) 100 MG tablet Take 1 tablet (100 mg total) by mouth 2 (two) times daily. 12/20/17   Midge Minium, MD  montelukast (SINGULAIR) 10 MG tablet Take 1 tablet (10 mg total) by mouth at bedtime. 11/19/17   Valentina Shaggy, MD  nitroGLYCERIN (NITROSTAT) 0.3 MG SL tablet Place 0.3 mg under the tongue every 5 (five) minutes as needed for chest pain.    [provider]  NONFORMULARY OR COMPOUNDED ITEM GI COCKTAIL 52ml 2 % lidocaine 52ml  Dicyclomine 10mg /70ml 253ml Miralax   TAKE 1-2 TIMES DAILY AS NEEDED 08/19/17   Willia Craze, NP  ondansetron (ZOFRAN ODT) 8 MG disintegrating tablet Take 1 tablet (8 mg total) by mouth every 8 (eight) hours as needed for nausea or vomiting. 12/29/17   Dorie Rank, MD  oxyCODONE (OXY IR/ROXICODONE) 5 MG immediate release tablet Take 1 tablet (5 mg total) by mouth every 6 (six) hours as needed for severe pain. 12/15/17   Biagio Borg, MD  pantoprazole (PROTONIX) 20 MG tablet Take 1 tablet (20 mg total) by mouth daily. 12/29/17   Dorie Rank, MD  predniSONE (DELTASONE) 10  MG tablet 3 tabs x3 days and then 2 tabs x3 days and then 1 tab x3 days.  Take w/ food. 12/20/17   Midge Minium, MD  PROAIR HFA 108 [phone] Base) MCG/ACT inhaler Inhale 1-2 puffs into the lungs every 4 (four) hours as needed for shortness of breath or wheezing. 11/19/17   Valentina Shaggy, MD  promethazine-dextromethorphan (PROMETHAZINE-DM) 6.25-15 MG/5ML syrup Take 5 mLs by mouth 4 (four) times daily as needed. 12/20/17   Midge Minium, MD  rosuvastatin (CRESTOR) 40 MG tablet Take 1 tablet (40 mg total) by mouth daily. 12/15/17   Biagio Borg, MD  valACYclovir (VALTREX) 500 MG tablet TAKE 1 TABLET(500 MG) BY MOUTH DAILY 07/25/17   Biagio Borg, MD    Family History Family History  Problem Relation Age of Onset  . Emphysema Father   . Dementia Mother   . Diabetes Maternal Grandmother   . Diabetes Maternal Aunt   . Diabetes Unknown        mat. cousin  . Heart disease Brother   . Colon cancer Neg Hx   . Allergic rhinitis Neg Hx   . Angioedema Neg Hx   . Asthma Neg Hx   . Atopy Neg Hx   . Eczema Neg Hx   . Immunodeficiency Neg Hx   . Urticaria Neg Hx     Social History Social History   Tobacco Use  . Smoking status: Former Smoker    Packs/day: 1.00    Years: 39.00    Pack years: 39.00    Types: Cigarettes    Start date: 08/06/1977  . Smokeless tobacco: Never Used  . Tobacco comment: stopped 10/22.     Substance Use Topics  . Alcohol use: No    Alcohol/week: 0.0 oz  . Drug use: No     Allergies   Bee venom; Codeine; Ibuprofen; Tramadol; Hydrocodone-acetaminophen; Latex; Levofloxacin; and Propoxyphene n-acetaminophen   Review of Systems Review of Systems  Constitutional: Negative for fever.  Respiratory: Negative for shortness of breath.   Cardiovascular: Negative for chest pain.  Gastrointestinal: Positive for nausea and vomiting. Negative for constipation and diarrhea.  Genitourinary: Negative for dysuria.  Musculoskeletal: Negative for neck pain and neck stiffness.  Neurological: Positive for light-headedness and headaches.  All other systems reviewed and are negative.    Physical Exam Updated Vital Signs BP 115/73 (BP Location: Right Arm)   Pulse 80   Temp 97.8 F (36.6 C) (Oral)   Resp 16   Ht 1.575 m (5\' 2" )   Wt 66.7 kg (147 lb)   SpO2 98%   BMI 26.89 kg/m   Physical Exam  Constitutional: She appears well-developed and well-nourished. No distress.  HENT:  Head: Normocephalic and atraumatic.  Right Ear: External ear normal.  Left Ear: External ear normal.  Eyes: Conjunctivae are normal. Right eye exhibits no discharge. Left eye exhibits no discharge. No scleral icterus.  Neck: Neck supple. No tracheal deviation present.  Cardiovascular: Normal rate, regular rhythm and intact distal pulses.  Pulmonary/Chest: Effort normal and breath sounds normal. No stridor. No respiratory distress. She has no wheezes. She has no rales.  Abdominal: Soft. Bowel sounds are normal. She exhibits no distension. There is no tenderness. There is no rebound and no guarding.  Musculoskeletal: She exhibits no edema or tenderness.  Neurological: She is alert. No cranial nerve deficit (no facial droop, extraocular movements intact, no slurred speech) or sensory deficit. She exhibits normal muscle tone. She displays no seizure activity.  Coordination normal.  Able to sit up in bed without  assistance, equal grip strength bilaterally, no pronator drift, able to lift both legs off the bed for 5 seconds  Skin: Skin is warm and dry. No rash noted. She is not diaphoretic.  Psychiatric: She exhibits a depressed mood.  Nursing note and vitals reviewed.    ED Treatments / Results  Labs (all labs ordered are listed, but only abnormal results are displayed) Labs Reviewed  CBC WITH DIFFERENTIAL/PLATELET - Abnormal; Notable for the following components:      Result Value   Hemoglobin 11.1 (*)    HCT 34.9 (*)    All other components within normal limits  COMPREHENSIVE METABOLIC PANEL - Abnormal; Notable for the following components:   Glucose, Bld 108 (*)    Calcium 8.8 (*)    All other components within normal limits  LIPASE, BLOOD    EKG EKG Interpretation  Date/Time:  Monday December 29 2017 08:29:53 EDT Ventricular Rate:  85 PR Interval:    QRS Duration: 100 QT Interval:  375 QTC Calculation: 446 R Axis:   55 Text Interpretation:  Sinus rhythm Borderline short PR interval Low voltage, precordial leads No significant change since last tracing Confirmed by Dorie Rank [phone]) on 12/29/2017 8:38:28 AM   Radiology Dg Chest 2 View  Result Date: 12/29/2017 CLINICAL DATA:  Cough. EXAM: CHEST - 2 VIEW COMPARISON:  11/10/2017 FINDINGS: Normal heart size. No pleural effusion or edema identified. No airspace opacities identified. The visualized osseous structures are unremarkable. Prominent right cardiophrenic fat pad identified. IMPRESSION: 1. No acute cardiopulmonary abnormalities. Electronically Signed   By: Kerby Moors M.D.   On: 12/29/2017 08:50    Procedures Procedures (including critical care time)  Medications Ordered in ED Medications  sodium chloride 0.9 % bolus 1,000 mL (1,000 mLs Intravenous New Bag/Given 12/29/17 0915)  ondansetron (ZOFRAN) injection 4 mg (4 mg Intravenous Given 12/29/17 1962)     Initial Impression / Assessment and Plan / ED Course  I have  reviewed the triage vital signs and the nursing notes.  Pertinent labs & imaging results that were available during my care of the patient were reviewed by me and considered in my medical decision making (see chart for details).  Clinical Course as of Dec 30 1039  Mon Dec 29, 2017  1003 Slight decrease in hemoglobin compared to previous.  Doubt this would be contributing to her symptoms.   [JK]  1003 CXR no findings.  Electrolytes normal.   [JK]    Clinical Course User Index [JK] Dorie Rank, MD    Patient presents emergency room with complaints of cough, nausea and generalized weakness.  Patient has no focal neurologic deficits.  I suspect her weakness is related to her generalized malaise and poor p.o. intake.  She has no abdominal tenderness.  Chest x-ray does not show any pneumonia.  It is possible she may have a viral respiratory illness but I think some of her symptoms are related to gastroesophageal reflux.  She does describe a burning discomfort.  Plan on discharge home with antacids and Zofran for nausea.  Her mild anemia was reviewed and we discussed having this followed up with her primary care doctor.  She is not having any symptoms to suggest GI bleed.  Warning signs and precautions discussed.  Final Clinical Impressions(s) / ED Diagnoses   Final diagnoses:  Gastroesophageal reflux disease, esophagitis presence not specified  Nausea    ED Discharge Orders  Ordered    pantoprazole (PROTONIX) 20 MG tablet  Daily,   Status:  Discontinued     12/29/17 1037    ondansetron (ZOFRAN ODT) 8 MG disintegrating tablet  Every 8 hours PRN,   Status:  Discontinued     12/29/17 1037    ondansetron (ZOFRAN ODT) 8 MG disintegrating tablet  Every 8 hours PRN     12/29/17 1039    pantoprazole (PROTONIX) 20 MG tablet  Daily     12/29/17 1039       Dorie Rank, MD 12/29/17 1041

## 2017-12-30 ENCOUNTER — Ambulatory Visit: Payer: Medicare Other | Admitting: Internal Medicine

## 2018-01-08 ENCOUNTER — Ambulatory Visit (INDEPENDENT_AMBULATORY_CARE_PROVIDER_SITE_OTHER): Payer: Medicare Other | Admitting: *Deleted

## 2018-01-08 DIAGNOSIS — J309 Allergic rhinitis, unspecified: Secondary | ICD-10-CM | POA: Diagnosis not present

## 2018-01-16 ENCOUNTER — Encounter: Payer: Self-pay | Admitting: Internal Medicine

## 2018-01-16 ENCOUNTER — Ambulatory Visit (INDEPENDENT_AMBULATORY_CARE_PROVIDER_SITE_OTHER): Payer: Medicare Other | Admitting: Internal Medicine

## 2018-01-16 VITALS — BP 132/88 | HR 82 | Temp 98.1°F | Ht 62.0 in | Wt 142.0 lb

## 2018-01-16 DIAGNOSIS — R42 Dizziness and giddiness: Secondary | ICD-10-CM | POA: Diagnosis not present

## 2018-01-16 DIAGNOSIS — R05 Cough: Secondary | ICD-10-CM | POA: Diagnosis not present

## 2018-01-16 DIAGNOSIS — R062 Wheezing: Secondary | ICD-10-CM | POA: Diagnosis not present

## 2018-01-16 DIAGNOSIS — R059 Cough, unspecified: Secondary | ICD-10-CM

## 2018-01-16 DIAGNOSIS — J441 Chronic obstructive pulmonary disease with (acute) exacerbation: Secondary | ICD-10-CM

## 2018-01-16 MED ORDER — METHYLPREDNISOLONE ACETATE 80 MG/ML IJ SUSP
80.0000 mg | Freq: Once | INTRAMUSCULAR | Status: AC
  Administered 2018-01-16: 80 mg via INTRAMUSCULAR

## 2018-01-16 MED ORDER — BENZONATATE 100 MG PO CAPS: ORAL_CAPSULE | ORAL | 1 refills | Status: DC

## 2018-01-16 MED ORDER — PREDNISONE 10 MG PO TABS: ORAL_TABLET | ORAL | 0 refills | Status: DC

## 2018-01-16 MED ORDER — AZITHROMYCIN 250 MG PO TABS: ORAL_TABLET | ORAL | 1 refills | Status: DC

## 2018-01-16 MED ORDER — MECLIZINE HCL 12.5 MG PO TABS: 12.5000 mg | ORAL_TABLET | Freq: Three times a day (TID) | ORAL | 2 refills | Status: DC | PRN

## 2018-01-16 MED ORDER — BUPROPION HCL ER (XL) 150 MG PO TB24: 150.0000 mg | ORAL_TABLET | Freq: Every day | ORAL | 3 refills | Status: DC

## 2018-01-16 NOTE — Progress Notes (Signed)
Subjective:    Patient ID: Lori [name], female    DOB: 1956/08/08, 62 y.o.   MRN: [id number]  HPI  Here with acute onset mild to mod 5 days ST, HA, general weakness and malaise, with prod cough greenish sputum, but Pt denies chest pain, increased sob or doe, wheezing, orthopnea, PND, increased LE swelling, palpitations, dizziness or syncope, except for 2-3 days onset mild wheezing, sob. Pt denies new neurological symptoms such as new headache, or facial or extremity weakness or numbness   Pt denies polydipsia, polyuria.  Pt continues to have recurring LBP, without bowel or bladder change, fever, wt loss,  worsening LE pain/numbness/weakness, gait change or falls.  Planning for [facility] to the lower back June 3.  Does have recurring vertigo no change, asks for antivert refill.  Asks for non chantix tx for quit smoking.  No other interval change or new complaint Past Medical History:  Diagnosis Date  . Acute MI ([place])    x3 - by report only. She has had 3 negative Myoview stress test with no evidence of prior infarct.  . Allergic rhinitis   . Allergy   . Anemia   . Ankle fracture 05/2016  . Anxiety   . Arthritis   . Asthma   . Barrett esophagus 2007  . Chest pain 05-02-2009   echo  EF 55%  . COPD (chronic obstructive pulmonary disease) with chronic bronchitis (HCC)    & Emphysema  . Depression   . Duodenitis without mention of hemorrhage 2007  . Esophageal reflux 2007  . Esophageal stricture   . Full dentures   . H/O hiatal hernia   . Hiatal hernia 6761,9509  . Hyperlipidemia   . Multiple fractures    from falls, fx rt. elbow, fx left wrist, bilateral ankles  . Pneumonia 10/2016  . Restrictive lung disease 06/30/2017  . Ulcer    Past Surgical History:  Procedure Laterality Date  . ABDOMINAL HYSTERECTOMY     BSO  . APPENDECTOMY    . CESAREAN SECTION     x 2  . CHOLECYSTECTOMY    . CHONDROPLASTY Left 01/06/2015   Procedure: CHONDROPLASTY;  Surgeon: Marybelle Killings, MD;  Location:  [facility];  Service: Orthopedics;  Laterality: Left;  . COLONOSCOPY    . ELBOW FRACTURE SURGERY     right  . KNEE ARTHROSCOPY WITH EXCISION PLICA Left 12/10/7122   Procedure: KNEE ARTHROSCOPY WITH EXCISION PLICA;  Surgeon: Marybelle Killings, MD;  Location: [facility];  Service: Orthopedics;  Laterality: Left;  . Lower Extremity Arterial Dopplers  07/06/2013   RABI 1.0, LABI 1.1.; No evidence of significant vascular atherogenic plaque  . NECK SURGERY    . NM MYOVIEW LTD  09/2014   LOW RISK. Normal EF of 65% with no regional wall motion abnormalities. No ischemia or infarction.  . TONSILLECTOMY    . WRIST FRACTURE SURGERY     left, has plate    reports that she has quit smoking. Her smoking use included cigarettes. She started smoking about 40 years ago. She has a 39.00 pack-year smoking history. She has never used smokeless tobacco. She reports that she does not drink alcohol or use drugs. family history includes Dementia in her mother; Diabetes in her maternal aunt, maternal grandmother, and unknown relative; Emphysema in her father; Heart disease in her brother. Allergies  Allergen Reactions  . Bee Venom Swelling  . Codeine Nausea Only and Other (See Comments)  CAUSES ULCERS  . Ibuprofen Nausea And Vomiting  . Tramadol Nausea Only  . Hydrocodone-Acetaminophen Nausea Only  . Latex Rash  . Levofloxacin Nausea Only  . Propoxyphene N-Acetaminophen Nausea Only   Current Outpatient Medications on File Prior to Visit  Medication Sig Dispense Refill  . ALPRAZolam (XANAX) 1 MG tablet Take 1 tablet (1 mg total) by mouth daily as needed for anxiety. 30 tablet 2  . aspirin 81 MG EC tablet Take 1 tablet (81 mg total) by mouth daily. Swallow whole. 30 tablet 12  . BOTOX 100 units SOLR injection     . cetirizine (ZYRTEC) 10 MG tablet Take 1 tablet (10 mg total) by mouth daily. 30 tablet 5  . cyclobenzaprine (FLEXERIL) 5 MG tablet Take 1 tablet (5 mg total) by mouth 3  (three) times daily as needed for muscle spasms. 60 tablet 1  . DEXILANT 60 MG capsule Take 1 capsule (60 mg total) by mouth daily. 30 capsule 5  . montelukast (SINGULAIR) 10 MG tablet Take 1 tablet (10 mg total) by mouth at bedtime. 30 tablet 5  . nitroGLYCERIN (NITROSTAT) 0.3 MG SL tablet Place 0.3 mg under the tongue every 5 (five) minutes as needed for chest pain.    . NONFORMULARY OR COMPOUNDED ITEM GI COCKTAIL 63ml 2 % lidocaine 37ml Dicyclomine 10mg /51ml 265ml Miralax   TAKE 1-2 TIMES DAILY AS NEEDED 450 each 0  . oxyCODONE (OXY IR/ROXICODONE) 5 MG immediate release tablet Take 1 tablet (5 mg total) by mouth every 6 (six) hours as needed for severe pain. 30 tablet 0  . pantoprazole (PROTONIX) 20 MG tablet Take 1 tablet (20 mg total) by mouth daily. 30 tablet 0  . PROAIR HFA 108 (90 Base) MCG/ACT inhaler Inhale 1-2 puffs into the lungs every 4 (four) hours as needed for shortness of breath or wheezing. 1 Inhaler 1  . rosuvastatin (CRESTOR) 40 MG tablet Take 1 tablet (40 mg total) by mouth daily. 90 tablet 3  . valACYclovir (VALTREX) 500 MG tablet TAKE 1 TABLET(500 MG) BY MOUTH DAILY 30 tablet 11   No current facility-administered medications on file prior to visit.    Review of Systems  Constitutional: Negative for other unusual diaphoresis or sweats HENT: Negative for ear discharge or swelling Eyes: Negative for other worsening visual disturbances Respiratory: Negative for stridor or other swelling  Gastrointestinal: Negative for worsening distension or other blood Genitourinary: Negative for retention or other urinary change Musculoskeletal: Negative for other MSK pain or swelling Skin: Negative for color change or other new lesions Neurological: Negative for worsening tremors and other numbness  Psychiatric/Behavioral: Negative for worsening agitation or other fatigue All other system neg per pt    Objective:   Physical Exam BP 132/88   Pulse 82   Temp 98.1 F (36.7 C)  (Oral)   Ht 5\' 2"  (1.575 m)   Wt 142 lb (64.4 kg)   SpO2 99%   BMI 25.97 kg/m  VS noted, mild ill Constitutional: Pt appears in NAD HENT: Head: NCAT.  Right Ear: External ear normal.  Left Ear: External ear normal.  Bilat tm's with mild erythema.  Max sinus areas non tender.  Pharynx with mild erythema, no exudate Eyes: . Pupils are equal, round, and reactive to light. Conjunctivae and EOM are normal Nose: without d/c or deformity Neck: Neck supple. Gross normal ROM Cardiovascular: Normal rate and regular rhythm.   Pulmonary/Chest: Effort normal and breath sounds decreased without rales but with mild scattered wheezing.  Abd:  Soft, NT, ND, + BS, no organomegaly Neurological: Pt is alert. At baseline orientation, motor grossly intact Skin: Skin is warm. No rashes, other new lesions, no LE edema Psychiatric: Pt behavior is normal without agitation         Assessment & Plan:

## 2018-01-16 NOTE — Patient Instructions (Addendum)
You had the steroid shot today  Please take all new medication as prescribed - the antibiotic, cough pills, and prednisone  Please continue all other medications as before, and refills have been done if requested - the antivert  Please have the pharmacy call with any other refills you may need.  Please keep your appointments with your specialists as you may have planned - the lower back injection

## 2018-01-18 NOTE — Assessment & Plan Note (Signed)
Mild to mod, c/w bronchitis vs pna, for antibx course, cough med prn,  to f/u any worsening symptoms or concerns 

## 2018-01-18 NOTE — Assessment & Plan Note (Signed)
Stable recurrent, for antivert refil

## 2018-01-18 NOTE — Assessment & Plan Note (Signed)
Mild to mod, for depomedrol IM 80, predpac asd, to f/u any worsening symptoms or concerns 

## 2018-01-22 ENCOUNTER — Ambulatory Visit (INDEPENDENT_AMBULATORY_CARE_PROVIDER_SITE_OTHER): Payer: Medicare Other | Admitting: *Deleted

## 2018-01-22 ENCOUNTER — Telehealth: Payer: Self-pay | Admitting: Allergy & Immunology

## 2018-01-22 DIAGNOSIS — G509 Disorder of trigeminal nerve, unspecified: Secondary | ICD-10-CM | POA: Diagnosis not present

## 2018-01-22 DIAGNOSIS — M791 Myalgia, unspecified site: Secondary | ICD-10-CM | POA: Diagnosis not present

## 2018-01-22 DIAGNOSIS — G43719 Chronic migraine without aura, intractable, without status migrainosus: Secondary | ICD-10-CM | POA: Diagnosis not present

## 2018-01-22 DIAGNOSIS — R51 Headache: Secondary | ICD-10-CM | POA: Diagnosis not present

## 2018-01-22 DIAGNOSIS — J309 Allergic rhinitis, unspecified: Secondary | ICD-10-CM

## 2018-01-22 DIAGNOSIS — M542 Cervicalgia: Secondary | ICD-10-CM | POA: Diagnosis not present

## 2018-01-22 NOTE — Telephone Encounter (Signed)
Patient came in to get allergy injection today and did not notify nurse about having problems also left without waiting. Called patient states she is having wheezing and asthma problems for 3 days worse at night and in the morning. Offered patient an appointment for today states she cannot come in since she is leaving another appointment and going home. She would like something sent in Dr Ernst Bowler please advise.

## 2018-01-22 NOTE — Telephone Encounter (Signed)
She needs to be seen if she is having the problems that she reports. I do not want to give her steroids over the phone without looking at her. You could call her and recommend that she use one of her controller inhalers twice daily for a week or two (she probably has a Symbicort at home).   Salvatore Marvel, MD Allergy and [facility]

## 2018-01-22 NOTE — Telephone Encounter (Signed)
Pt came up to me and wanted to know if you can call in some asthma pills for her asthma problems. [facility] on [address] [phone].

## 2018-01-22 NOTE — Telephone Encounter (Signed)
Per one of the previous OV notes you advised her to discontinue her daily controller inhalers. She does not tolerate the powdered inhalers at all. She is asking if we could work her in NEXT week. She is also going to play BINGO tonight. Do you want to just send in a different controller medication.

## 2018-01-23 ENCOUNTER — Other Ambulatory Visit: Payer: Self-pay | Admitting: Allergy & Immunology

## 2018-01-23 MED ORDER — BUDESONIDE-FORMOTEROL FUMARATE 160-4.5 MCG/ACT IN AERO: 2.0000 | INHALATION_SPRAY | Freq: Every day | RESPIRATORY_TRACT | 5 refills | Status: DC

## 2018-01-23 NOTE — Telephone Encounter (Signed)
I spoke with patient and I have advised her of this information. She is going to get the spacer at her upcoming appointment. I am sending in Symbicort as instructed. I did not get a chance to ask about the bingo game.

## 2018-01-23 NOTE — Telephone Encounter (Signed)
I had her discontinue her controllers since she was not taking any at all. I was trying to simplify her regimen for her. Just send in Symbicort 160/4.5 two puffs once daily to help control her symptoms. Stress the importance of using this EVERY day with a spacer.  Ask her how the [facility] game went. I am completely interested in this.   Salvatore Marvel, MD [facility] [place]

## 2018-01-23 NOTE — Telephone Encounter (Signed)
Noted. Thanks for sending that prescription in.   Please call her back about the bingo game STAT.  Salvatore Marvel, MD [facility] [place]

## 2018-01-23 NOTE — Addendum Note (Signed)
Addended by: Lucrezia Starch I on: 01/23/2018 11:44 AM   Modules accepted: Orders

## 2018-01-29 ENCOUNTER — Encounter: Payer: Self-pay | Admitting: Allergy & Immunology

## 2018-01-29 ENCOUNTER — Ambulatory Visit (INDEPENDENT_AMBULATORY_CARE_PROVIDER_SITE_OTHER): Payer: Medicare Other | Admitting: Allergy & Immunology

## 2018-01-29 VITALS — BP 118/72 | HR 88 | Temp 98.4°F | Resp 18 | Wt 141.2 lb

## 2018-01-29 DIAGNOSIS — J3089 Other allergic rhinitis: Secondary | ICD-10-CM

## 2018-01-29 DIAGNOSIS — J0101 Acute recurrent maxillary sinusitis: Secondary | ICD-10-CM

## 2018-01-29 DIAGNOSIS — J302 Other seasonal allergic rhinitis: Secondary | ICD-10-CM

## 2018-01-29 DIAGNOSIS — J449 Chronic obstructive pulmonary disease, unspecified: Secondary | ICD-10-CM

## 2018-01-29 DIAGNOSIS — Z9114 Patient's other noncompliance with medication regimen: Secondary | ICD-10-CM

## 2018-01-29 MED ORDER — METHYLPREDNISOLONE ACETATE 40 MG/ML IJ SUSP
40.0000 mg | Freq: Once | INTRAMUSCULAR | Status: AC
  Administered 2018-01-29: 40 mg via INTRAMUSCULAR

## 2018-01-29 MED ORDER — DOXYCYCLINE HYCLATE 100 MG PO CAPS: 100.0000 mg | ORAL_CAPSULE | Freq: Two times a day (BID) | ORAL | 0 refills | Status: AC

## 2018-01-29 NOTE — Progress Notes (Signed)
FOLLOW UP  Date of Service/Encounter:  01/29/18   Assessment:   Seasonal and perennial allergic rhinitis- on allergen immunotherapy every 2 weeks with good results  Cigarette smoker  Moderate persistent asthma with COPD overlap  Acute sinusitis  Poor compliance  Complex medical history   Asthma Reportables: Severity:moderate persistent Risk:high Control:not well controlled  [name] returns to clinic with continued symptoms of sinusitis.  She tends to be treated every 3 to 4 months with an antibiotic.  She is frequently requesting prednisone, although we tend to recommend that she use her controller medication instead.  She has been on every controller medication that we have, and in particular does not tolerate the dry powder inhalers.  Of note, she did have an absolute eosinophil count of 200 in December 2018, which would qualify her for 1 of our anti-IL-5 medications.  Her allergy shots are going well.  We did increase her to every 2 weeks due to breakthrough allergic rhinitis symptoms.  This seems to have helped her nasal symptoms tremendously.  She does continue to smoke, which likely worsens her breathing as well as the frequency of her sinus infections.  She has stopped in the past, but this is not lasted longer than a couple of weeks.  She is not interested in discussing smoking cessation today.   Plan/Recommendations:   1. Moderate persistent asthma, uncomplicated - Lung testing looked terrible today but it did get better with the nebulizer treatment.  - DepoMedrol injection given today which will last several days.  - We will consider the use of Fasenra or Nucala as a means of decreasing her prednisone use. -I will send a message to Lori [name] coordinator so that she can reach out to her to see if she would be interested. - Daily controller medication(s): Singulair (montelukast) 10mg  daily + Symbicort 160/4.5 two puffs once daily with spacer -  Rescue medications: Xopenex 4 puffs every 4-6 hours as needed or Xopenex nebulizer one vial puffs every 4-6 hours as needed - Asthma control goals:  * Full participation in all desired activities (may need albuterol before activity) * Albuterol use two time or less a week on average (not counting use with activity) * Cough interfering with sleep two time or less a month * Oral steroids no more than once a year * No hospitalizations   2. Perennial allergic rhinitis  - Continue with shots every two weeks to see if this provides some more relief.  - Continue with Singulair 10mg  daily. - Continue with cetirizine (Zyrtec) 10mg  daily to help with the sneezing and other allergy symptoms.   3. Acute sinusitis - We will send in a prescription for doxycycline 100mg  twice daily for two weeks. - If you continue to have problems after this, we will send you back to ENT.   4. Return in about 6 months (around 08/01/2018).    Subjective:   Lori [name] is a 62 y.o. female presenting today for follow up of  Chief Complaint  Patient presents with  . Asthma attach    Lori [name] has a history of the following: Patient Active Problem List   Diagnosis Date Noted  . Lumbar radiculitis 08/15/2017  . Degenerative disc disease, lumbar 07/21/2017  . Encounter for well adult exam with abnormal findings 06/30/2017  . Epigastric pain 06/30/2017  . Restrictive lung disease 06/30/2017  . Leg cramping 06/09/2017  . Elbow contusion 06/09/2017  . Right elbow pain 05/27/2017  . COPD mixed  type ([place]) 01/31/2017  . Dizziness 01/20/2017  . Seasonal and perennial allergic rhinitis 01/01/2017  . Moderate persistent asthma, uncomplicated 44/09/270  . Pulmonary emphysema ([place]) 12/03/2016  . Tobacco use disorder 12/03/2016  . Chronic seasonal allergic rhinitis 12/03/2016  . Medication management 11/29/2016  . Cough 11/23/2016  . Rash 11/23/2016  . Vertigo 10/22/2016  . Gastroesophageal reflux disease  with esophagitis 10/22/2016  . Non compliance w medication regimen 08/01/2016  . Allergic contact dermatitis due to metals 08/01/2016  . Migraine without aura and without status migrainosus, not intractable 07/22/2016  . Spasm of muscle of lower back 06/21/2016  . Chronic rhinitis 06/19/2016  . Gait difficulty 06/21/2015  . Fibromyalgia 06/21/2015  . Conversion reaction 06/21/2015  . Depression 06/21/2015  . Syncope and collapse 06/21/2015  . Plica syndrome of left knee 01/06/2015  . Anxiety state 09/06/2014  . History of MI 09/06/2014  . Cigarette smoker 06/27/2013  . Borderline hypertension 06/27/2013  . Chronic low back pain with right-sided sciatica 06/14/2013  . Leg pain, bilateral 06/14/2013  . Gastroparesis 10/26/2009  . Musculoskeletal chest pain 05/02/2009  . Hyperlipidemia LDL goal <100 11/18/2008  . COPD exacerbation ([place]) 11/18/2008  . HEPATIC CYST 11/18/2008    History obtained from: chart review and patient.  Lori [name]'s Primary Care Provider is Biagio Borg, MD.     Lori [name] is a 62 y.o. female presenting for a follow up visit.  She was last seen in March 2019 for a sick visit.  At that time, she had been treated with Augmentin but continued to have symptoms of sinusitis.  We started her on a course of doxycycline.  Her lung testing looked good for once.  We deferred on prednisone.  We again encouraged her to use her controller medication, but she has never used one consistently in the past.  Since the last visit, she has mostly done well. She has had two weeks of coughing and shortness of breath. She has been using her Symbicort but only seems that she is having the shortness of breath. She tells me today that she has a "baby heart". She sees Dr. Selena [name] for her heart.  I have absolutely no idea what she means by a baby heart.  In any case, upon further discussion, it seems that she does endorse palpitations with the use of the albuterol.  Therefore, we will try to  obtain authorization to prescribe Xopenex instead.  Over the last 2 weeks, she has been using her albuterol with improvement in her symptoms.  She did call last week and we recommended that she use her Symbicort, which she does feel provide some relief.  She also reports that she is having marketed nasal congestion and sinus pressure today.  Per usual, she is not using any of her nasal sprays on a routine basis.  She is using her cetirizine daily and occasionally will use her Singulair.  Her shots are going well, and she feels that increasing the frequency to every 2 weeks has helped her symptoms overall.  She is having quite a bit of stress right now.  It seems that she has having some marital problems.  She does not want to talk about her husband at all today.  She has been spending quite a bit of time at her friend's house, who accompanies her today as well as every other visit.  She tells me that her Xanax was refilled, which has helped her tremendously.  Otherwise, there have been no changes  to her past medical history, surgical history, family history, or social history.    Review of Systems: a 14-point review of systems is pertinent for what is mentioned in HPI.  Otherwise, all other systems were negative. Constitutional: negative other than that listed in the HPI Eyes: negative other than that listed in the HPI Ears, nose, mouth, throat, and face: negative other than that listed in the HPI Respiratory: negative other than that listed in the HPI Cardiovascular: negative other than that listed in the HPI Gastrointestinal: negative other than that listed in the HPI Genitourinary: negative other than that listed in the HPI Integument: negative other than that listed in the HPI Hematologic: negative other than that listed in the HPI Musculoskeletal: negative other than that listed in the HPI Neurological: negative other than that listed in the HPI Allergy/Immunologic: negative other than  that listed in the HPI    Objective:   Blood pressure 118/72, pulse 88, temperature 98.4 F (36.9 C), resp. rate 18, weight 141 lb 3.2 oz (64 kg), SpO2 96 %. Body mass index is 25.83 kg/m.   Physical Exam:  General: Alert, interactive, in no acute distress. Talkative.  Eyes: No conjunctival injection bilaterally, no discharge on the right, no discharge on the left and no Horner-Trantas dots present. PERRL bilaterally. EOMI without pain. No photophobia.  Ears: Right TM pearly gray with normal light reflex, Left TM pearly gray with normal light reflex, Right TM intact without perforation and Left TM intact without perforation.  Nose/Throat: External nose within normal limits and septum midline. Turbinates markedly edematous with clear discharge. Posterior oropharynx erythematous with cobblestoning in the posterior oropharynx. Tonsils 2+ without exudates.  Tongue without thrush. Lungs: Mildly decreased breath sounds with expiratory wheezing bilaterally. No increased work of breathing. CV: Normal S1/S2. No murmurs. Capillary refill <2 seconds.  Skin: Warm and dry, without lesions or rashes. Neuro:   Grossly intact. No focal deficits appreciated. Responsive to questions.  Diagnostic studies:   Spirometry: results abnormal (FEV1: 1.20/52%, FVC: 1.70/56%, FEV1/FVC: 71%).    Spirometry consistent with possible restrictive disease. Xopenex/Atrovent nebulizer treatment given in clinic with significant improvement in FEV1 per ATS criteria.  Her FVC increased 8%, but this is not significant per ATS criteria.  She did have improved air movement at the bases following the treatment.  We did administer 1 dose of Depo-Medrol 40 mg in clinic.  Allergy Studies: none    Salvatore Marvel, MD  [facility] [place]

## 2018-01-29 NOTE — Patient Instructions (Addendum)
1. Moderate persistent asthma, uncomplicated - Lung testing looked terrible today but it did get better with the nebulizer treatment.  - DepoMedrol injection given today which will last several days.  - Daily controller medication(s): Singulair (montelukast) 10mg  daily + Symbicort 160/4.5 two puffs once daily with spacer - Rescue medications: Xopenex 4 puffs every 4-6 hours as needed or Xopenex nebulizer one vial puffs every 4-6 hours as needed - Asthma control goals:  * Full participation in all desired activities (may need albuterol before activity) * Albuterol use two time or less a week on average (not counting use with activity) * Cough interfering with sleep two time or less a month * Oral steroids no more than once a year * No hospitalizations   2. Perennial allergic rhinitis  - Continue with shots every two weeks to see if this provides some more relief.  - Continue with Singulair 10mg  daily. - Continue with cetirizine (Zyrtec) 10mg  daily to help with the sneezing and other allergy symptoms.   3. Acute sinusitis - We will send in a prescription for doxycycline 100mg  twice daily for two weeks. - If you continue to have problems after this, we will send you back to ENT.   4. Return in about 6 months (around 08/01/2018).   Please inform us of any Emergency Department visits, hospitalizations, or changes in symptoms. Call us before going to the ED for breathing or allergy symptoms since we might be able to fit you in for a sick visit. Feel free to contact us anytime with any questions, problems, or concerns.  It was a pleasure to see you again today!   Websites that have reliable patient information: 1. American Academy of Asthma, Allergy, and Immunology: www.aaaai.org 2. Food Allergy Research and Education (FARE): foodallergy.org 3. Mothers of Asthmatics: http://www.asthmacommunitynetwork.org 4. American College of Allergy, Asthma, and Immunology: www.acaai.org

## 2018-02-05 ENCOUNTER — Ambulatory Visit (INDEPENDENT_AMBULATORY_CARE_PROVIDER_SITE_OTHER): Payer: Medicare Other

## 2018-02-05 DIAGNOSIS — J309 Allergic rhinitis, unspecified: Secondary | ICD-10-CM

## 2018-02-11 IMAGING — MR MR KNEE*L* W/O CM
4 of 5 series · 32 of 40 positions shown · non-contrast
Comparison: 10/24/2014

CLINICAL DATA: Left knee pain.  Weakness.

EXAM:
MRI OF THE LEFT KNEE WITHOUT CONTRAST
TECHNIQUE: Multiplanar, multisequence MR imaging of the knee was performed. No
intravenous contrast was administered.

[Series 6: PD fat-sat · axial · left · 3.0mm · 0.39mm/px · z∈[-72,+43]mm · 9 of 36 slices shown (1 of 3)]
[im 1/36]
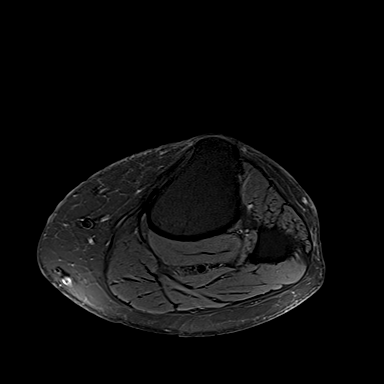
[im 5/36]
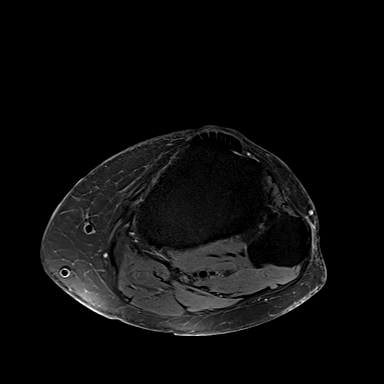
[im 9/36]
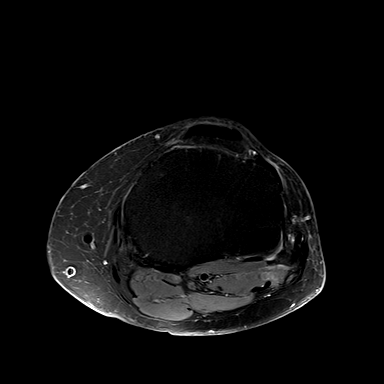
[im 14/36]
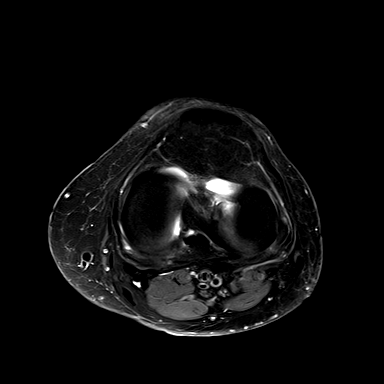
[im 18/36]
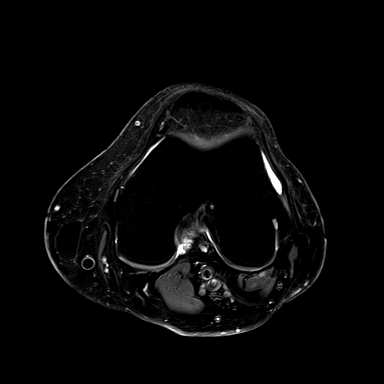
[im 22/36]
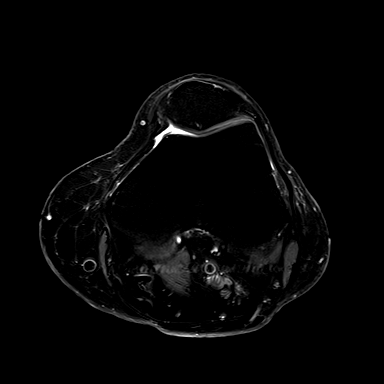
[im 27/36]
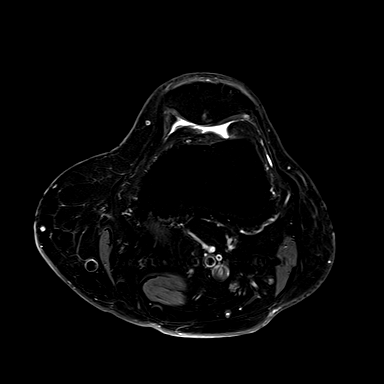
[im 31/36]
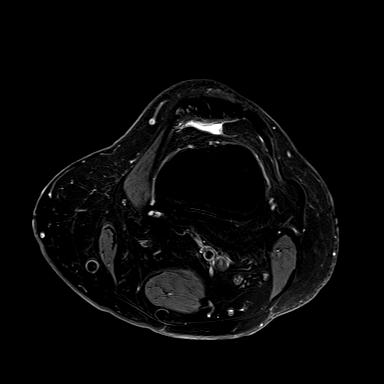
[im 36/36]
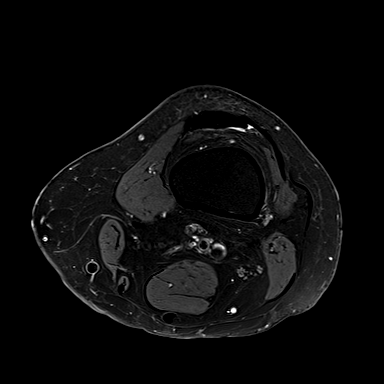

[Series 8: PD fat-sat · coronal · left · 3.0mm · 0.33mm/px · 8 of 27 slices shown (2 of 3)]
[im 1/27]
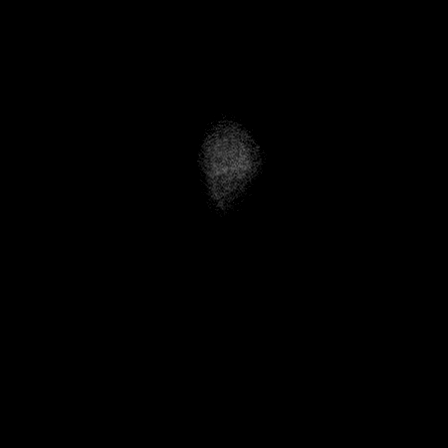
[im 4/27]
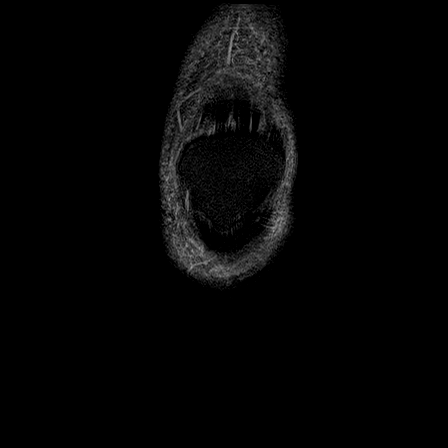
[im 8/27]
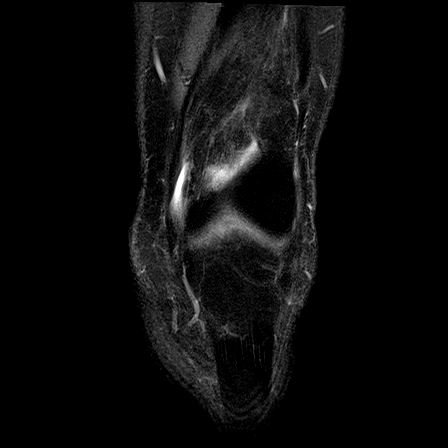
[im 12/27]
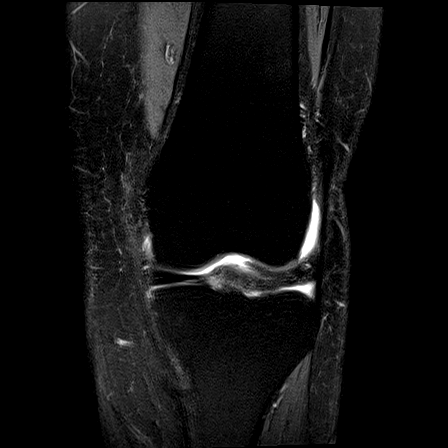
[im 15/27]
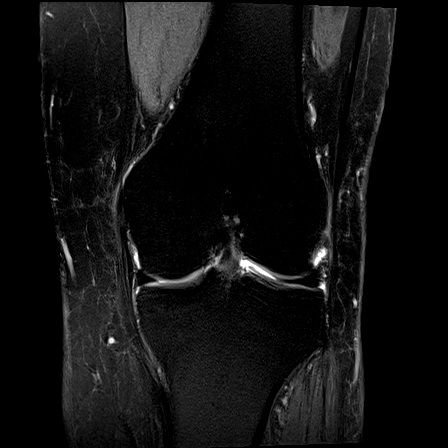
[im 19/27]
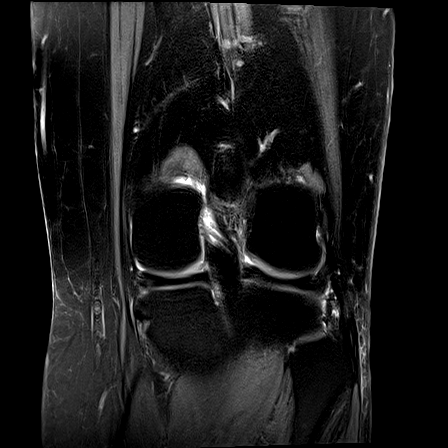
[im 23/27]
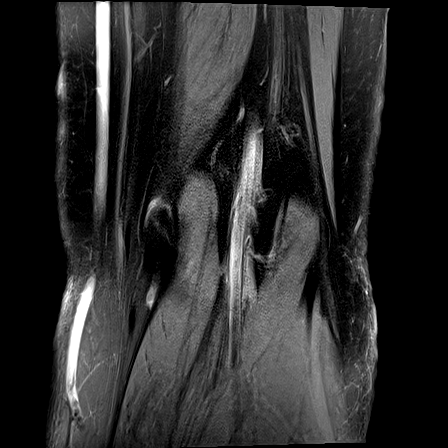
[im 27/27]
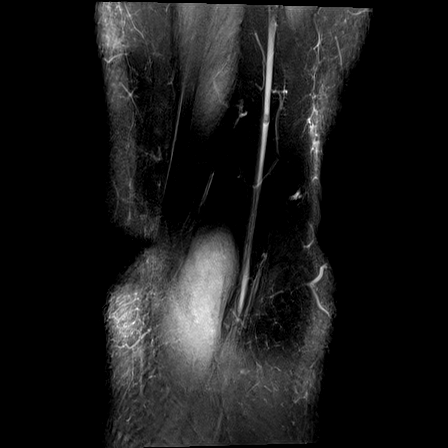

[Series 9: PD fat-sat · sagittal · left · 3.0mm · 0.39mm/px · 7 of 24 slices shown (3 of 3)]
[im 1/24]
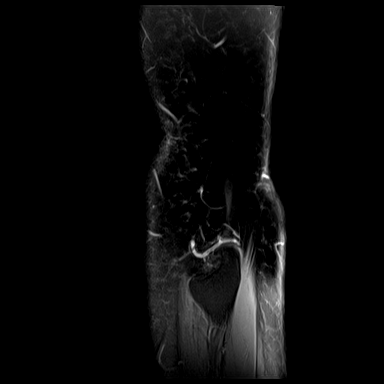
[im 4/24]
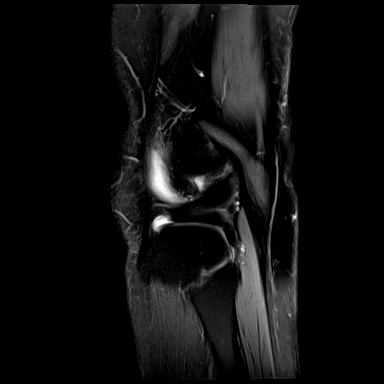
[im 8/24]
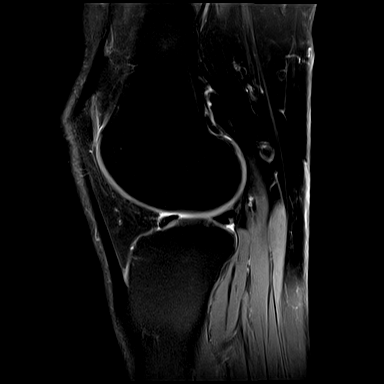
[im 12/24]
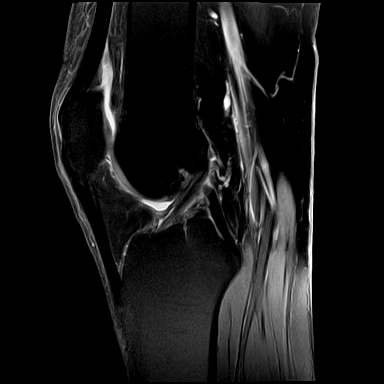
[im 16/24]
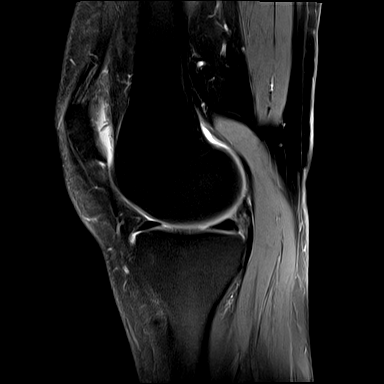
[im 20/24]
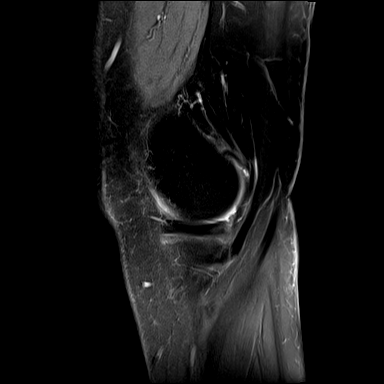
[im 24/24]
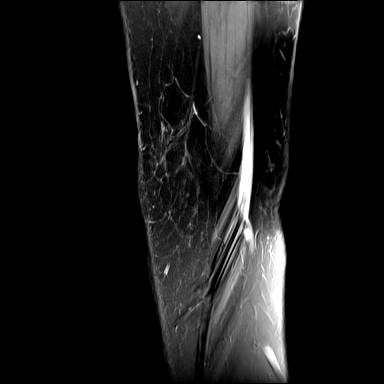

[Series 10: T2 fat-sat · coronal · left · 3.0mm · 0.39mm/px · 8 of 27 slices shown]
[im 1/27]
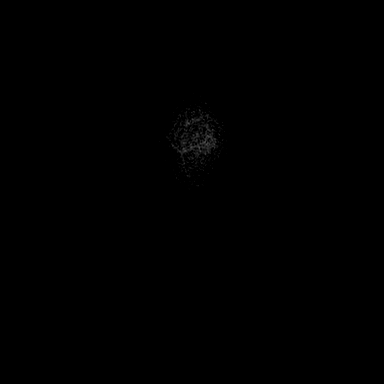
[im 4/27]
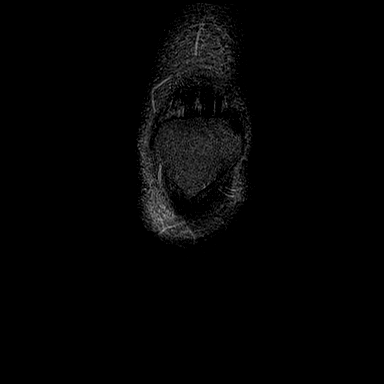
[im 8/27]
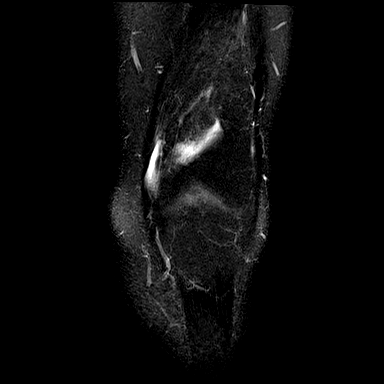
[im 12/27]
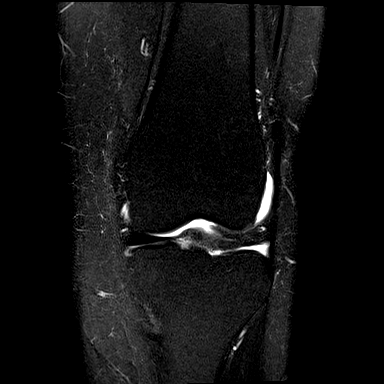
[im 15/27]
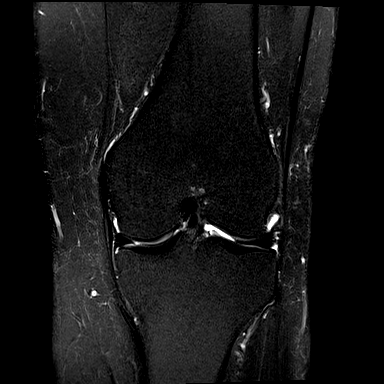
[im 19/27]
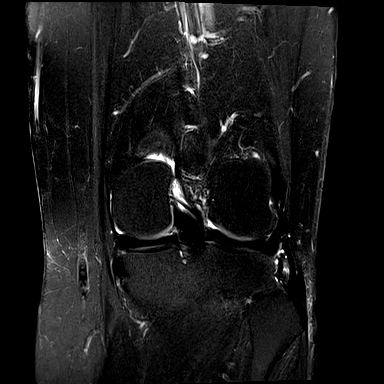
[im 23/27]
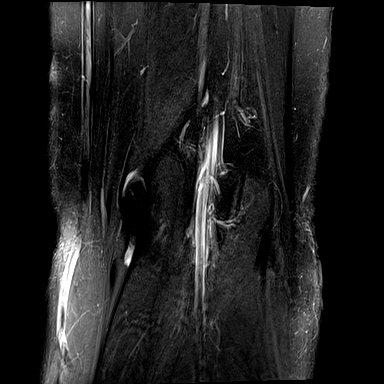
[im 27/27]
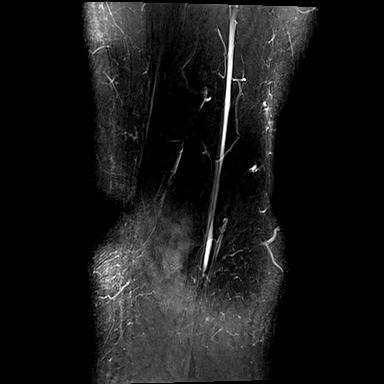

[32 of 40 positions shown; findings below may reference images not displayed]

FINDINGS: MENISCI

Medial meniscus: Mild degeneration of the posterior horn of the
medial meniscus with a small undersurface tear (image 8/ series 9).

Lateral meniscus:  Intact.

LIGAMENTS

Cruciates:  Intact ACL and PCL.

Collaterals: Medial collateral ligament is intact. Lateral
collateral ligament complex is intact.

CARTILAGE

Patellofemoral: Full-thickness cartilage loss of the medial patellar
facet and patellar apex with mild subchondral reactive marrow
changes at the patellar apex.

Medial:  No focal chondral defect.

Lateral:  No focal chondral defect.

Joint: No joint effusion. Normal Hoffa's fat. No plical thickening.

Popliteal Fossa:  Tiny Baker cyst.  Intact popliteus tendon.

Extensor Mechanism:  Intact.

Bones: No other focal marrow signal abnormality. No acute fracture
or dislocation.
IMPRESSION: 1. Mild degeneration of the posterior horn of the medial meniscus
with a small undersurface tear (image 8/ series [DATE]. Full-thickness cartilage loss of the medial patellar facet and
patellar apex with mild subchondral reactive marrow changes at the
patellar apex.

## 2018-02-14 ENCOUNTER — Other Ambulatory Visit: Payer: Self-pay | Admitting: Internal Medicine

## 2018-02-18 ENCOUNTER — Ambulatory Visit (INDEPENDENT_AMBULATORY_CARE_PROVIDER_SITE_OTHER): Payer: Medicare Other

## 2018-02-18 DIAGNOSIS — J309 Allergic rhinitis, unspecified: Secondary | ICD-10-CM | POA: Diagnosis not present

## 2018-02-18 DIAGNOSIS — G8929 Other chronic pain: Secondary | ICD-10-CM | POA: Diagnosis not present

## 2018-02-18 DIAGNOSIS — M546 Pain in thoracic spine: Secondary | ICD-10-CM | POA: Diagnosis not present

## 2018-02-18 DIAGNOSIS — M545 Low back pain: Secondary | ICD-10-CM | POA: Diagnosis not present

## 2018-02-26 ENCOUNTER — Ambulatory Visit (INDEPENDENT_AMBULATORY_CARE_PROVIDER_SITE_OTHER): Payer: Medicare Other

## 2018-02-26 DIAGNOSIS — J4550 Severe persistent asthma, uncomplicated: Secondary | ICD-10-CM | POA: Diagnosis not present

## 2018-02-26 MED ORDER — BENRALIZUMAB 30 MG/ML ~~LOC~~ SOSY
30.0000 mg | PREFILLED_SYRINGE | Freq: Once | SUBCUTANEOUS | Status: AC
  Administered 2018-02-26: 30 mg via SUBCUTANEOUS

## 2018-03-02 ENCOUNTER — Ambulatory Visit (INDEPENDENT_AMBULATORY_CARE_PROVIDER_SITE_OTHER): Payer: Medicare Other

## 2018-03-02 DIAGNOSIS — J309 Allergic rhinitis, unspecified: Secondary | ICD-10-CM

## 2018-03-09 ENCOUNTER — Ambulatory Visit (INDEPENDENT_AMBULATORY_CARE_PROVIDER_SITE_OTHER): Payer: Medicare Other

## 2018-03-09 DIAGNOSIS — J309 Allergic rhinitis, unspecified: Secondary | ICD-10-CM

## 2018-03-12 DIAGNOSIS — M542 Cervicalgia: Secondary | ICD-10-CM | POA: Diagnosis not present

## 2018-03-12 DIAGNOSIS — M791 Myalgia, unspecified site: Secondary | ICD-10-CM | POA: Diagnosis not present

## 2018-03-12 DIAGNOSIS — R51 Headache: Secondary | ICD-10-CM | POA: Diagnosis not present

## 2018-03-12 DIAGNOSIS — G43719 Chronic migraine without aura, intractable, without status migrainosus: Secondary | ICD-10-CM | POA: Diagnosis not present

## 2018-03-13 IMAGING — MR MR LUMBAR SPINE W/O CM
4 of 5 series · 19 of 48 positions shown · non-contrast
Comparison: None.

CLINICAL DATA: Mid low back pain, left leg radiculopathy

EXAM:
MRI LUMBAR SPINE WITHOUT CONTRAST
TECHNIQUE: Multiplanar, multisequence MR imaging of the lumbar spine was
performed. No intravenous contrast was administered.

[Series 6: T2 · sagittal · 4.0mm · 0.73mm/px · 7 of 15 slices shown (1 of 2)]
[im 1/15]
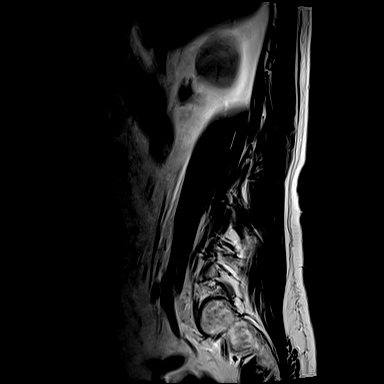
[im 3/15]
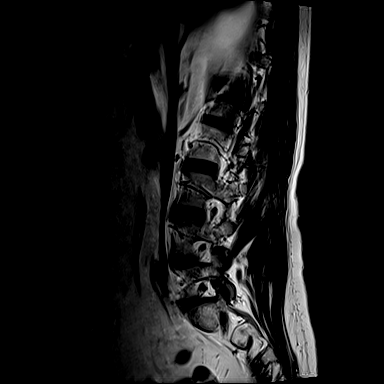
[im 5/15]
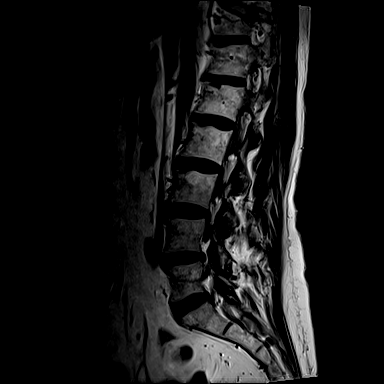
[im 8/15]
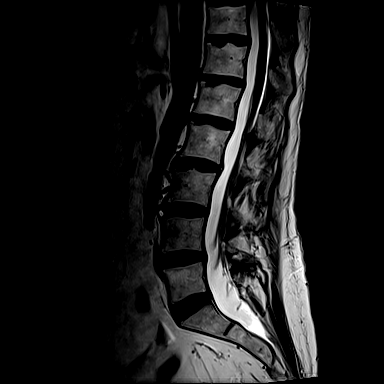
[im 10/15]
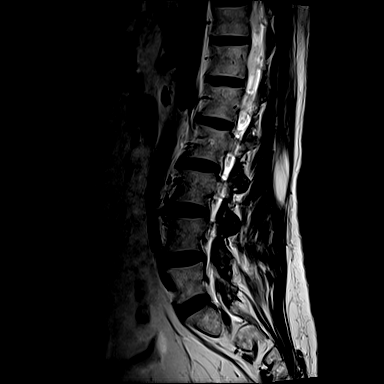
[im 12/15]
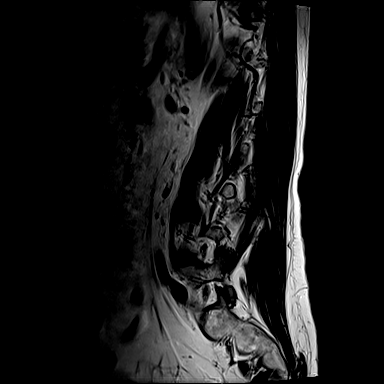
[im 15/15]
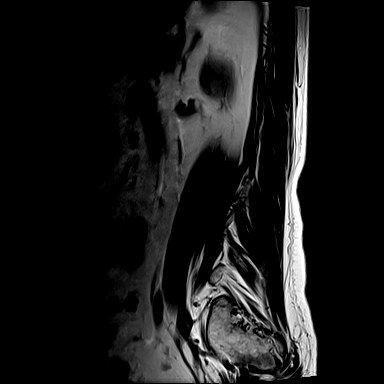

[Series 8: T1 · sagittal · 4.0mm · 0.73mm/px · 3 of 15 slices shown (1 of 2)]
[im 3/15]
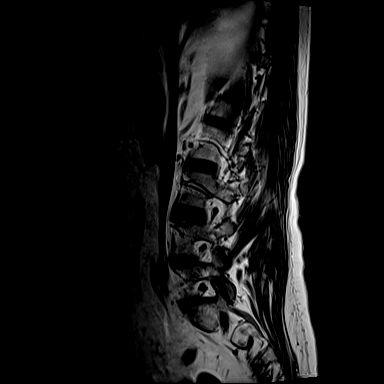
[im 9/15]
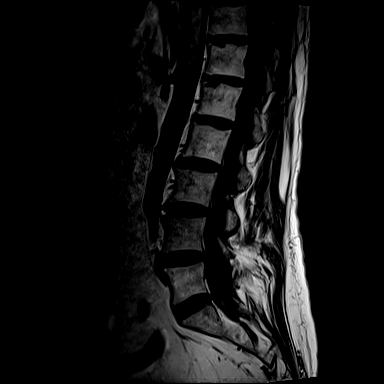
[im 15/15]
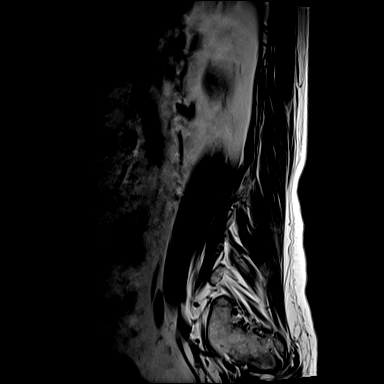

[Series 11: T1 · axial · 4.0mm · 0.28mm/px · z∈[+29,+156]mm · 3 of 34 slices shown (2 of 2)]
[im 6/34]
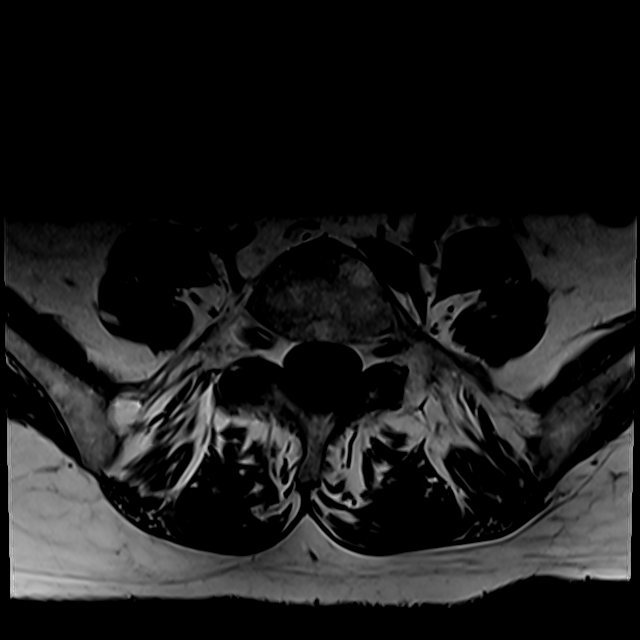
[im 18/34]
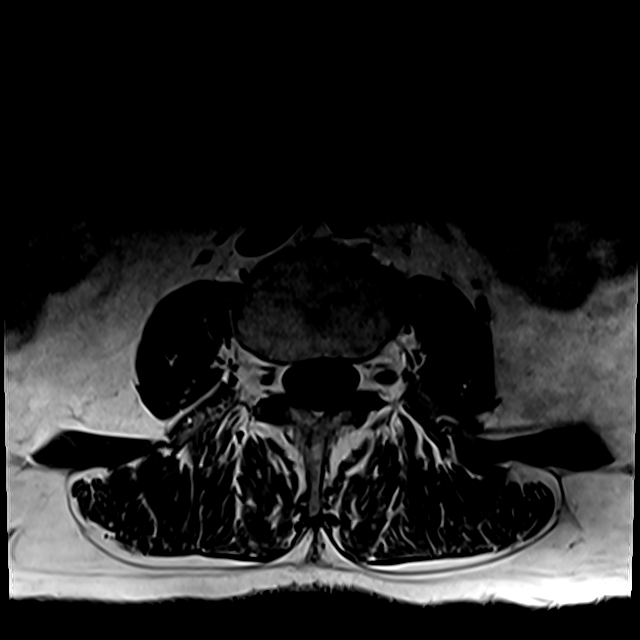
[im 28/34]
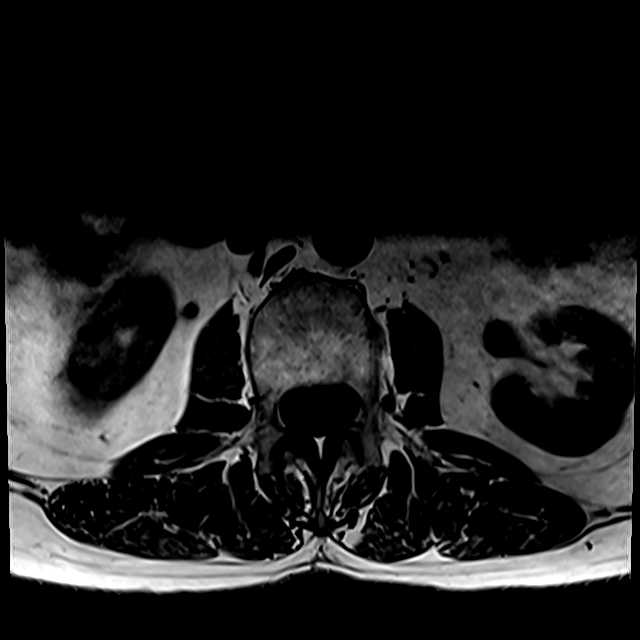

[Series 14: T2 · axial · 4.0mm · 0.28mm/px · z∈[+4,+156]mm · 6 of 34 slices shown (2 of 2)]
[im 1/34]
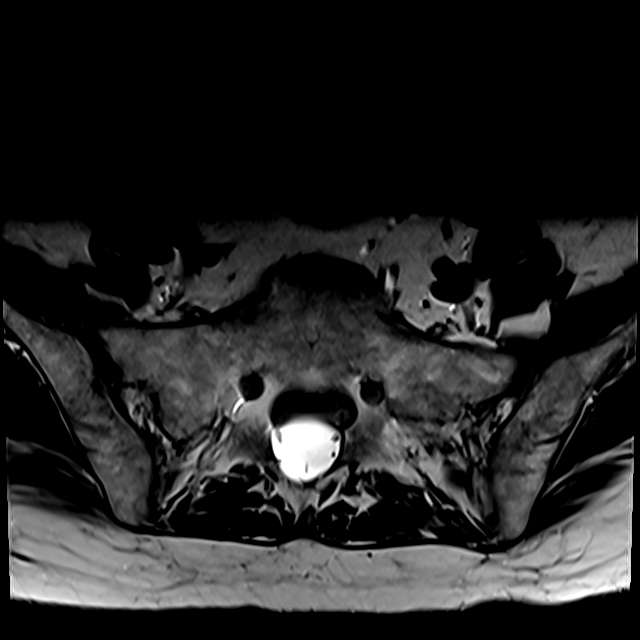
[im 6/34]
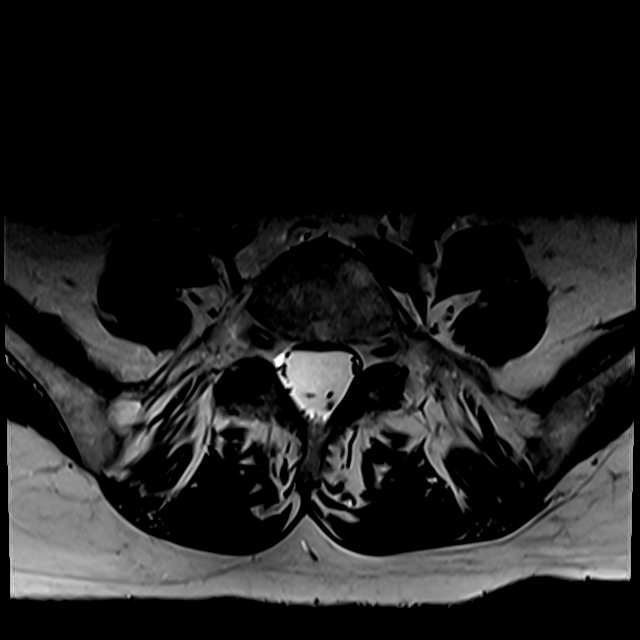
[im 11/34]
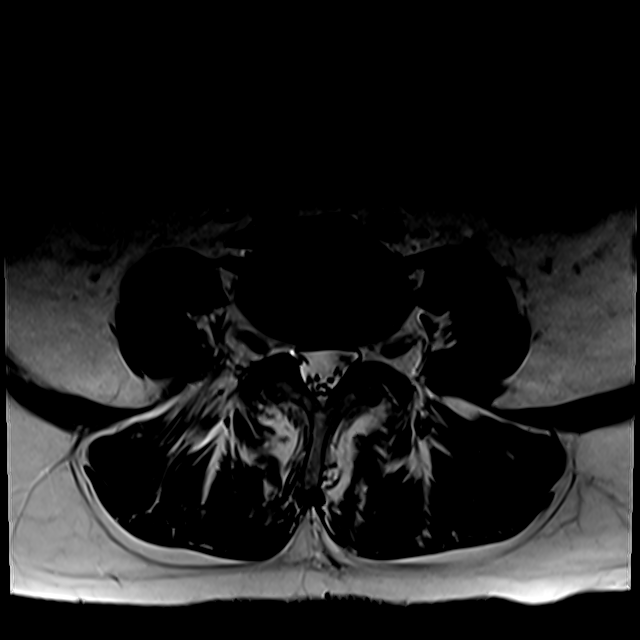
[im 16/34]
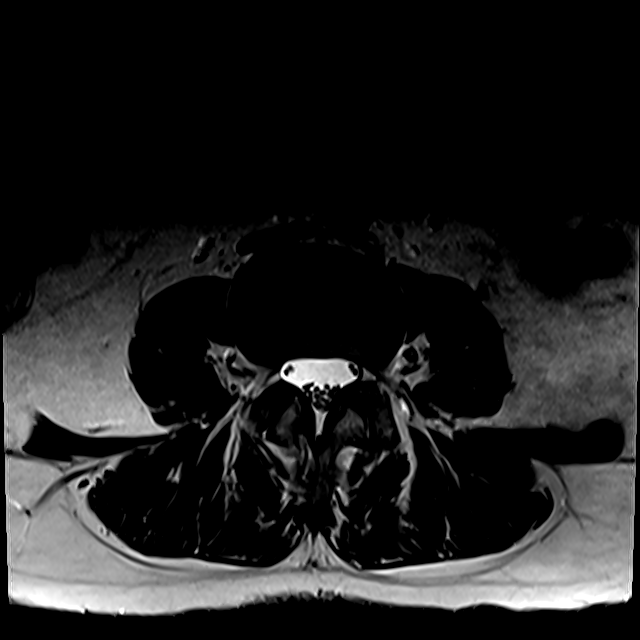
[im 18/34]
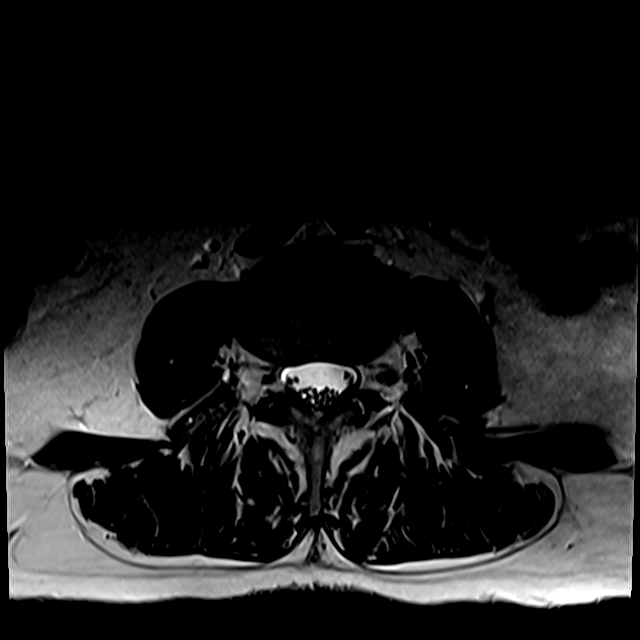
[im 28/34]
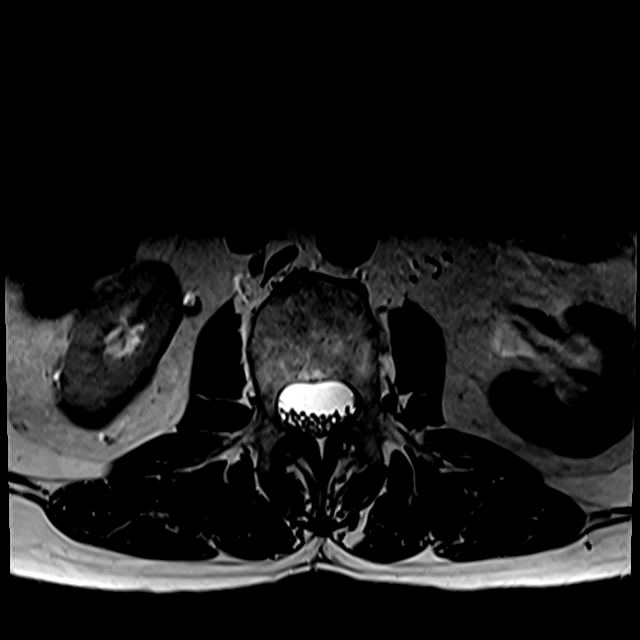

[19 of 48 positions shown; findings below may reference images not displayed]

FINDINGS: Segmentation: Conventional anatomy assistant, with the last open
disc space designated L5-S1.

Alignment: The vertebral bodies of the lumbar spine are normal in
alignment.

Bones: The vertebral bodies of the lumbar spine are normal in size.
There is normal bone marrow signal demonstrated throughout the
vertebra. The visualized portions of the SI joints are unremarkable.

Conus medullaris: Extends to the L1 level and appears normal. The
nerve roots of the cauda equina and the filum terminale are normal.

Paraspinal and other soft tissues: There is no focal abnormality.
The imaged intra-abdominal contents are unremarkable.

Disc levels:

Disc spaces: Disc heights are relatively well maintained.

T12-L1: No significant disc bulge. No evidence of neural foraminal
stenosis. No central canal stenosis.

L1-L2: No significant disc bulge. No evidence of neural foraminal
stenosis. No central canal stenosis.

L2-L3: Tiny central disc protrusion. No evidence of neural foraminal
stenosis. No central canal stenosis.

L3-L4: No significant disc bulge. No evidence of neural foraminal
stenosis. No central canal stenosis.

L4-L5: Mild broad-based disc bulge. Mild bilateral facet
arthropathy. No evidence of neural foraminal stenosis. No central
canal stenosis.

L5-S1: No significant disc bulge. No evidence of neural foraminal
stenosis. No central canal stenosis.
IMPRESSION: 1. Mild lumbar spine spondylosis as described above.

## 2018-03-15 ENCOUNTER — Other Ambulatory Visit: Payer: Self-pay | Admitting: Internal Medicine

## 2018-03-16 NOTE — Telephone Encounter (Signed)
Done erx 

## 2018-03-21 ENCOUNTER — Ambulatory Visit (INDEPENDENT_AMBULATORY_CARE_PROVIDER_SITE_OTHER): Payer: Medicare Other | Admitting: Family Medicine

## 2018-03-21 ENCOUNTER — Encounter: Payer: Self-pay | Admitting: Family Medicine

## 2018-03-21 ENCOUNTER — Other Ambulatory Visit: Payer: Self-pay

## 2018-03-21 ENCOUNTER — Telehealth: Payer: Self-pay | Admitting: Internal Medicine

## 2018-03-21 VITALS — BP 104/60 | HR 98 | Temp 98.1°F | Resp 16 | Ht 62.0 in | Wt 134.0 lb

## 2018-03-21 DIAGNOSIS — G44321 Chronic post-traumatic headache, intractable: Secondary | ICD-10-CM

## 2018-03-21 DIAGNOSIS — R51 Headache: Secondary | ICD-10-CM | POA: Diagnosis not present

## 2018-03-21 DIAGNOSIS — R519 Headache, unspecified: Secondary | ICD-10-CM | POA: Insufficient documentation

## 2018-03-21 MED ORDER — OXYCODONE-ACETAMINOPHEN 10-325 MG PO TABS: 1.0000 | ORAL_TABLET | Freq: Three times a day (TID) | ORAL | 0 refills | Status: DC | PRN

## 2018-03-21 MED ORDER — PROMETHAZINE HCL 25 MG PO TABS: 25.0000 mg | ORAL_TABLET | Freq: Three times a day (TID) | ORAL | 0 refills | Status: DC | PRN

## 2018-03-21 NOTE — Telephone Encounter (Signed)
Patient requests appointment at 7:54 am and 8:55am. Appointment has been scheduled.

## 2018-03-21 NOTE — Telephone Encounter (Signed)
PER [facility]  Patient requests appointment.
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
## 2018-03-21 NOTE — Patient Instructions (Signed)
General Headache Without Cause A headache is pain or discomfort felt around the head or neck area. The specific cause of a headache may not be found. There are many causes and types of headaches. A few common ones are:  Tension headaches.  Migraine headaches.  Cluster headaches.  Chronic daily headaches.  Follow these instructions at home: Watch your condition for any changes. Take these steps to help with your condition: Managing pain  Take over-the-counter and prescription medicines only as told by your health care provider.  Lie down in a dark, quiet room when you have a headache.  If directed, apply ice to the head and neck area: ? Put ice in a plastic bag. ? Place a towel between your skin and the bag. ? Leave the ice on for 20 minutes, 2-3 times per day.  Use a heating pad or hot shower to apply heat to the head and neck area as told by your health care provider.  Keep lights dim if bright lights bother you or make your headaches worse. Eating and drinking  Eat meals on a regular schedule.  Limit alcohol use.  Decrease the amount of caffeine you drink, or stop drinking caffeine. General instructions  Keep all follow-up visits as told by your health care provider. This is important.  Keep a headache journal to help find out what may trigger your headaches. For example, write down: ? What you eat and drink. ? How much sleep you get. ? Any change to your diet or medicines.  Try massage or other relaxation techniques.  Limit stress.  Sit up straight, and do not tense your muscles.  Do not use tobacco products, including cigarettes, chewing tobacco, or e-cigarettes. If you need help quitting, ask your health care provider.  Exercise regularly as told by your health care provider.  Sleep on a regular schedule. Get 7-9 hours of sleep, or the amount recommended by your health care provider. Contact a health care provider if:  Your symptoms are not helped by  medicine.  You have a headache that is different from the usual headache.  You have nausea or you vomit.  You have a fever. Get help right away if:  Your headache becomes severe.  You have repeated vomiting.  You have a stiff neck.  You have a loss of vision.  You have problems with speech.  You have pain in the eye or ear.  You have muscular weakness or loss of muscle control.  You lose your balance or have trouble walking.  You feel faint or pass out.  You have confusion. This information is not intended to replace advice given to you by your health care provider. Make sure you discuss any questions you have with your health care provider. Document Released: 09/02/2005 Document Revised: 02/08/2016 Document Reviewed: 12/26/2014 Elsevier Interactive Patient Education  2018 [employer]  
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
## 2018-03-26 ENCOUNTER — Ambulatory Visit (INDEPENDENT_AMBULATORY_CARE_PROVIDER_SITE_OTHER): Payer: Medicare Other | Admitting: *Deleted

## 2018-03-26 DIAGNOSIS — J309 Allergic rhinitis, unspecified: Secondary | ICD-10-CM | POA: Diagnosis not present

## 2018-03-27 ENCOUNTER — Ambulatory Visit (INDEPENDENT_AMBULATORY_CARE_PROVIDER_SITE_OTHER): Payer: Medicare Other

## 2018-03-27 DIAGNOSIS — J4550 Severe persistent asthma, uncomplicated: Secondary | ICD-10-CM | POA: Diagnosis not present

## 2018-03-27 DIAGNOSIS — M546 Pain in thoracic spine: Secondary | ICD-10-CM | POA: Diagnosis not present

## 2018-03-27 DIAGNOSIS — G8929 Other chronic pain: Secondary | ICD-10-CM | POA: Diagnosis not present

## 2018-03-27 DIAGNOSIS — M545 Low back pain: Secondary | ICD-10-CM | POA: Diagnosis not present

## 2018-03-27 MED ORDER — BENRALIZUMAB 30 MG/ML ~~LOC~~ SOSY
30.0000 mg | PREFILLED_SYRINGE | Freq: Once | SUBCUTANEOUS | Status: AC
  Administered 2018-03-27: 30 mg via SUBCUTANEOUS

## 2018-03-31 ENCOUNTER — Encounter: Payer: Self-pay | Admitting: Diagnostic Neuroimaging

## 2018-03-31 ENCOUNTER — Ambulatory Visit (INDEPENDENT_AMBULATORY_CARE_PROVIDER_SITE_OTHER): Payer: Medicare Other | Admitting: Diagnostic Neuroimaging

## 2018-03-31 VITALS — BP 116/65 | HR 76 | Temp 97.6°F | Ht 62.0 in | Wt 133.6 lb

## 2018-03-31 DIAGNOSIS — R269 Unspecified abnormalities of gait and mobility: Secondary | ICD-10-CM | POA: Diagnosis not present

## 2018-03-31 DIAGNOSIS — G43111 Migraine with aura, intractable, with status migrainosus: Secondary | ICD-10-CM | POA: Diagnosis not present

## 2018-03-31 MED ORDER — TOPIRAMATE 50 MG PO TABS: 50.0000 mg | ORAL_TABLET | Freq: Two times a day (BID) | ORAL | 12 refills | Status: DC

## 2018-03-31 MED ORDER — RIZATRIPTAN BENZOATE 10 MG PO TBDP: 10.0000 mg | ORAL_TABLET | ORAL | 11 refills | Status: DC | PRN
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
## 2018-03-31 NOTE — Patient Instructions (Addendum)
  -   HEADACHE PREVENTION --> start topiramate 50mg  at bedtime; after 1 week increase to twice a day; drink plenty of water  - HEADACHE TREATMENT --> use rizatriptan 10mg  as needed for breakthrough headache; may repeat x 1 after 2 hours; max 2 tabs per day or 8 per month  - IMPROVE nutrition, exercise, sleep, stress mgmt  - refer to physical therapy for balance difficulty; use cane until balance improves  - request headache specialist notes  - PLEASE BRING your medication bottles and list at next visit

## 2018-04-09 ENCOUNTER — Ambulatory Visit (INDEPENDENT_AMBULATORY_CARE_PROVIDER_SITE_OTHER): Payer: Medicare Other | Admitting: *Deleted

## 2018-04-09 DIAGNOSIS — J309 Allergic rhinitis, unspecified: Secondary | ICD-10-CM
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
## 2018-04-09 IMAGING — CR DG PELVIS 1-2V
1 series · 1 of 1 positions shown · non-contrast
Comparison: CT of the abdomen and pelvis performed 04/22/2015

CLINICAL DATA: Status post fall, with pelvic pain. Initial
encounter.

EXAM:
PELVIS - 1-2 VIEW

[t pelvis ap]
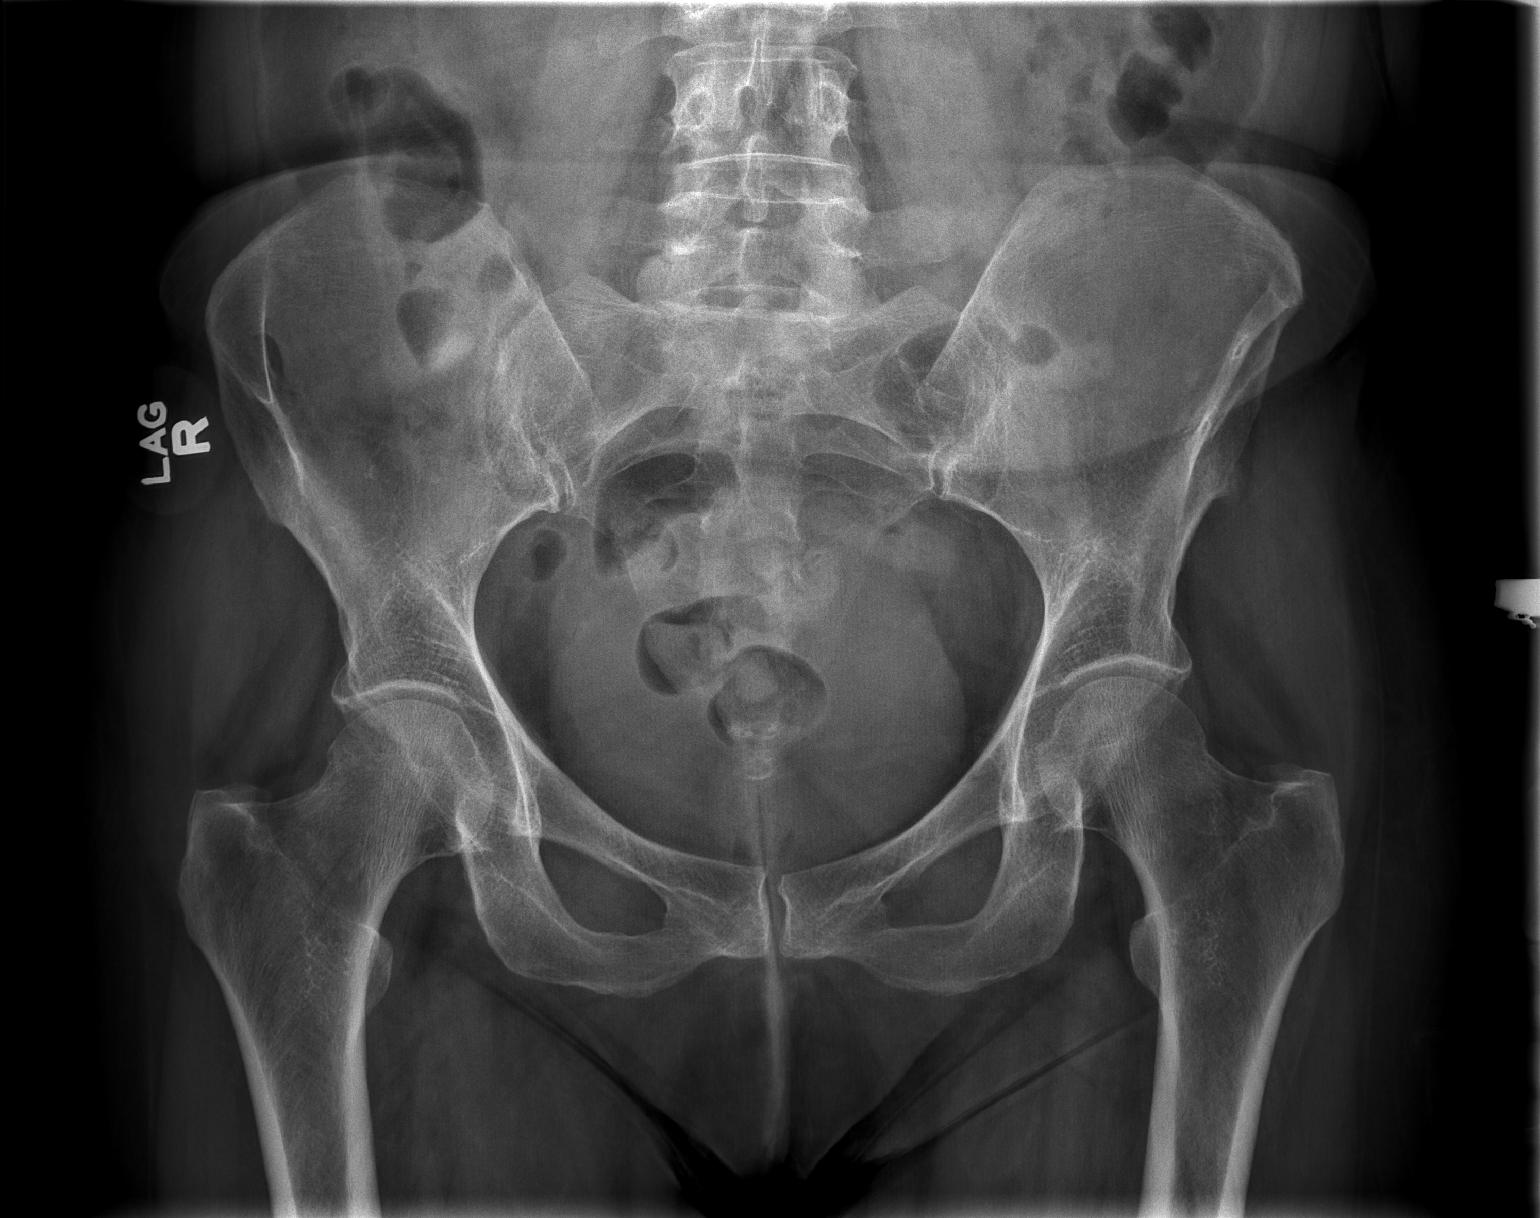

[1 of 1 positions shown; findings below may reference images not displayed]

FINDINGS: There is no evidence of fracture or dislocation. Both femoral heads
are seated normally within their respective acetabula. No
significant degenerative change is appreciated. The sacroiliac
joints are unremarkable in appearance.

The visualized bowel gas pattern is grossly unremarkable in
appearance.
IMPRESSION: No evidence of fracture or dislocation.
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
## 2018-04-24 ENCOUNTER — Telehealth: Payer: Self-pay | Admitting: Allergy & Immunology

## 2018-04-24 ENCOUNTER — Ambulatory Visit (INDEPENDENT_AMBULATORY_CARE_PROVIDER_SITE_OTHER): Payer: Medicare Other

## 2018-04-24 DIAGNOSIS — J4550 Severe persistent asthma, uncomplicated: Secondary | ICD-10-CM

## 2018-04-24 MED ORDER — PROAIR HFA 108 (90 BASE) MCG/ACT IN AERS: 1.0000 | INHALATION_SPRAY | RESPIRATORY_TRACT | 1 refills | Status: DC | PRN

## 2018-04-24 MED ORDER — BENRALIZUMAB 30 MG/ML ~~LOC~~ SOSY
30.0000 mg | PREFILLED_SYRINGE | Freq: Once | SUBCUTANEOUS | Status: AC
  Administered 2018-04-24: 30 mg via SUBCUTANEOUS

## 2018-04-24 NOTE — Telephone Encounter (Signed)
Pt needs to have a refill on her proiar called into [facility] on [address]. The number pt will be at is [phone].

## 2018-04-24 NOTE — Telephone Encounter (Signed)
Prescription has been sent in  

## 2018-04-27 ENCOUNTER — Ambulatory Visit (INDEPENDENT_AMBULATORY_CARE_PROVIDER_SITE_OTHER): Payer: Medicare Other

## 2018-04-27 ENCOUNTER — Telehealth: Payer: Self-pay | Admitting: Internal Medicine

## 2018-04-27 ENCOUNTER — Encounter: Payer: Self-pay | Admitting: Internal Medicine

## 2018-04-27 ENCOUNTER — Other Ambulatory Visit: Payer: Self-pay | Admitting: Internal Medicine

## 2018-04-27 ENCOUNTER — Other Ambulatory Visit (INDEPENDENT_AMBULATORY_CARE_PROVIDER_SITE_OTHER): Payer: Medicare Other

## 2018-04-27 ENCOUNTER — Ambulatory Visit (INDEPENDENT_AMBULATORY_CARE_PROVIDER_SITE_OTHER): Payer: Medicare Other | Admitting: Internal Medicine

## 2018-04-27 VITALS — BP 124/82 | HR 68 | Temp 98.5°F | Ht 62.0 in | Wt 136.0 lb

## 2018-04-27 DIAGNOSIS — D509 Iron deficiency anemia, unspecified: Secondary | ICD-10-CM

## 2018-04-27 DIAGNOSIS — J309 Allergic rhinitis, unspecified: Secondary | ICD-10-CM | POA: Diagnosis not present

## 2018-04-27 DIAGNOSIS — R110 Nausea: Secondary | ICD-10-CM | POA: Diagnosis not present

## 2018-04-27 DIAGNOSIS — R030 Elevated blood-pressure reading, without diagnosis of hypertension: Secondary | ICD-10-CM

## 2018-04-27 DIAGNOSIS — J441 Chronic obstructive pulmonary disease with (acute) exacerbation: Secondary | ICD-10-CM

## 2018-04-27 DIAGNOSIS — R202 Paresthesia of skin: Secondary | ICD-10-CM

## 2018-04-27 DIAGNOSIS — M5441 Lumbago with sciatica, right side: Secondary | ICD-10-CM

## 2018-04-27 DIAGNOSIS — E785 Hyperlipidemia, unspecified: Secondary | ICD-10-CM

## 2018-04-27 DIAGNOSIS — F17210 Nicotine dependence, cigarettes, uncomplicated: Secondary | ICD-10-CM

## 2018-04-27 DIAGNOSIS — E538 Deficiency of other specified B group vitamins: Secondary | ICD-10-CM | POA: Diagnosis not present

## 2018-04-27 DIAGNOSIS — G8929 Other chronic pain: Secondary | ICD-10-CM

## 2018-04-27 LAB — BASIC METABOLIC PANEL
BUN: 8 mg/dL (ref 6–23)
CHLORIDE: 107 meq/L (ref 96–112)
CO2: 23 meq/L (ref 19–32)
CREATININE: 1.1 mg/dL (ref 0.40–1.20)
Calcium: 9.7 mg/dL (ref 8.4–10.5)
GFR: 53.54 mL/min — ABNORMAL LOW (ref >60.00)
Glucose, Bld: 103 mg/dL — ABNORMAL HIGH (ref 70–99)
POTASSIUM: 4.1 meq/L (ref 3.5–5.1)
Sodium: 139 mEq/L (ref 135–145)

## 2018-04-27 LAB — VITAMIN B12: VITAMIN B 12: 142 pg/mL — AB (ref 211–911)

## 2018-04-27 LAB — LIPID PANEL
Cholesterol: 298 mg/dL — ABNORMAL HIGH (ref 0–200)
HDL: 39.3 mg/dL (ref >39.00)
NONHDL: 258.75
Total CHOL/HDL Ratio: 8
Triglycerides: 371 mg/dL — ABNORMAL HIGH (ref 0.0–149.0)
VLDL: 74.2 mg/dL — ABNORMAL HIGH (ref 0.0–40.0)

## 2018-04-27 LAB — CBC WITH DIFFERENTIAL/PLATELET
BASOS ABS: 0 10*3/uL (ref 0.0–0.1)
Basophils Relative: 0.1 % (ref 0.0–3.0)
EOS ABS: 0 10*3/uL (ref 0.0–0.7)
Eosinophils Relative: 0 % (ref 0.0–5.0)
HCT: 36.1 % (ref 36.0–46.0)
Hemoglobin: 12.2 g/dL (ref 12.0–15.0)
LYMPHS ABS: 3 10*3/uL (ref 0.7–4.0)
Lymphocytes Relative: 37.5 % (ref 12.0–46.0)
MCHC: 33.8 g/dL (ref 30.0–36.0)
MCV: 79.2 fl (ref 78.0–100.0)
MONOS PCT: 7.3 % (ref 3.0–12.0)
Monocytes Absolute: 0.6 10*3/uL (ref 0.1–1.0)
NEUTROS ABS: 4.3 10*3/uL (ref 1.4–7.7)
NEUTROS PCT: 55.1 % (ref 43.0–77.0)
PLATELETS: 285 10*3/uL (ref 150.0–400.0)
RBC: 4.56 Mil/uL (ref 3.87–5.11)
RDW: 17.4 % — AB (ref 11.5–15.5)
WBC: 7.9 10*3/uL (ref 4.0–10.5)

## 2018-04-27 LAB — HEPATIC FUNCTION PANEL
ALBUMIN: 4.4 g/dL (ref 3.5–5.2)
ALK PHOS: 86 U/L (ref 39–117)
ALT: 11 U/L (ref 0–35)
AST: 12 U/L (ref 0–37)
Bilirubin, Direct: 0.1 mg/dL (ref 0.0–0.3)
Total Bilirubin: 0.5 mg/dL (ref 0.2–1.2)
Total Protein: 7.1 g/dL (ref 6.0–8.3)

## 2018-04-27 LAB — IBC PANEL
IRON: 74 ug/dL (ref 42–145)
SATURATION RATIOS: 16.7 % — AB (ref 20.0–50.0)
Transferrin: 317 mg/dL (ref 212.0–360.0)

## 2018-04-27 LAB — LDL CHOLESTEROL, DIRECT: Direct LDL: 221 mg/dL

## 2018-04-27 LAB — TSH: TSH: 1.55 u[IU]/mL (ref 0.35–4.50)

## 2018-04-27 MED ORDER — HYDROCODONE-HOMATROPINE 5-1.5 MG/5ML PO SYRP: 5.0000 mL | ORAL_SOLUTION | Freq: Four times a day (QID) | ORAL | 0 refills | Status: AC | PRN

## 2018-04-27 MED ORDER — ROSUVASTATIN CALCIUM 40 MG PO TABS: 40.0000 mg | ORAL_TABLET | Freq: Every day | ORAL | 3 refills | Status: DC

## 2018-04-27 MED ORDER — AZITHROMYCIN 250 MG PO TABS: ORAL_TABLET | ORAL | 1 refills | Status: DC

## 2018-04-27 MED ORDER — PROMETHAZINE HCL 25 MG PO TABS: 25.0000 mg | ORAL_TABLET | Freq: Three times a day (TID) | ORAL | 2 refills | Status: DC | PRN

## 2018-04-27 MED ORDER — DULOXETINE HCL 30 MG PO CPEP: 30.0000 mg | ORAL_CAPSULE | Freq: Every day | ORAL | 3 refills | Status: DC

## 2018-04-27 MED ORDER — PREDNISONE 10 MG PO TABS: ORAL_TABLET | ORAL | 0 refills | Status: DC

## 2018-04-27 NOTE — Assessment & Plan Note (Signed)
For f/u lab today per pt request,  to f/u any worsening symptoms or concerns, last episode documented was 10 yrs ago with iron defiency

## 2018-04-27 NOTE — Assessment & Plan Note (Signed)
Ok for check b12, refer neurology per pt request

## 2018-04-27 NOTE — Telephone Encounter (Signed)
Copied from [place] [id number]. Topic: Inquiry >> Apr 27, 2018  1:22 PM Lori [name], Marland Kitchen wrote: Reason for CRM: pt would like call abcka bout labs. Please call [phone]

## 2018-04-27 NOTE — Assessment & Plan Note (Signed)
[place] for duloxetin refill,  to f/u any worsening symptoms or concerns

## 2018-04-27 NOTE — Patient Instructions (Signed)
You are given the prescription for the pillows  Please take all new medication as prescribed - the antibiotic, cough medicine, and prednisone  Please continue all other medications as before, and refills have been done if requested - the phenergan and duloxetine   Please have the pharmacy call with any other refills you may need.  Please continue your efforts at being more active, low cholesterol diet, and weight control.  Please keep your appointments with your specialists as you may have planned  Please go to the LAB in the Basement (turn left off the elevator) for the tests to be done today  You will be contacted by phone if any changes need to be made immediately.  Otherwise, you will receive a letter about your results with an explanation, but please check with MyChart first.  Please remember to sign up for MyChart if you have not done so, as this will be important to you in the future with finding out test results, communicating by private email, and scheduling acute appointments online when needed.  Please return in 6 months, or sooner if needed

## 2018-04-27 NOTE — Assessment & Plan Note (Addendum)
Mild to mod, declines cxr, for antibx, cough med prn, predpac asd,  to f/u any worsening symptoms or concerns  Note:  Total time for pt hx, exam, review of record with pt in the room, determination of diagnoses and plan for further eval and tx is > 40 min, with over 50% spent in coordination and counseling of patient including the differential dx, tx, further evaluation and other management of copd exacerbation, chronic LBP, intermittent paresthesias of hands and feet, iron def anemia, nausea,

## 2018-04-27 NOTE — Progress Notes (Signed)
Subjective:    Patient ID: Lori [name], female    DOB: Jan 28, 1956, 62 y.o.   MRN: [id number]  HPI   Here with acute onset mild to mod 2-3 days ST, HA, general weakness and malaise, with prod cough greenish sputum, but Pt denies chest pain, increased sob or doe, wheezing, orthopnea, PND, increased LE swelling, palpitations, dizziness or syncope, except for mild wheezing and sob since last pm despite increased inhaler use.  Pt continues to have recurring LBP without change in severity, bowel or bladder change, fever, wt loss,  worsening LE pain/numbness/weakness, gait change or falls, asks for duloxitine refill.  Denies worsening reflux, abd pain, dysphagia, bowel change or blood - asks for phenergan refill.  Pt denies new neurological symptoms such as new headache, or facial or extremity weakness or numbness except for recurrent sharp pains to the fingers and feet, like a numb pain kind of thing, asks for referral.  Occasional sharp eye pains and flashes also - plans to call her optho later today.  Asks for plastic pillow rx due to her allergies.  No overt bleeding.  Asks for f/u cbc as the home nurse told her she had "severe anemia."  Last low iron approx 10 yrs ago.   BP Readings from Last 3 Encounters:  04/27/18 124/82  03/31/18 116/65  03/21/18 104/60   Wt Readings from Last 3 Encounters:  04/27/18 136 lb (61.7 kg)  03/31/18 133 lb 9.6 oz (60.6 kg)  03/21/18 134 lb (60.8 kg)  Quit smoking a month ago. Past Medical History:  Diagnosis Date  . Acute MI ([place])    x3 - by report only. She has had 3 negative Myoview stress test with no evidence of prior infarct.  . Allergic rhinitis   . Allergy   . Anemia   . Ankle fracture 05/2016  . Anxiety   . Arthritis   . Asthma   . Barrett esophagus 2007  . Chest pain 05-02-2009   echo  EF 55%  . COPD (chronic obstructive pulmonary disease) with chronic bronchitis (HCC)    & Emphysema  . Depression   . Duodenitis without mention of hemorrhage  2007  . Esophageal reflux 2007  . Esophageal stricture   . Full dentures   . H/O hiatal hernia   . Hiatal hernia 6789,[id number]  . Hyperlipidemia   . Multiple fractures    from falls, fx rt. elbow, fx left wrist, bilateral ankles  . Pneumonia 10/2016  . Restrictive lung disease 06/30/2017  . Ulcer    Past Surgical History:  Procedure Laterality Date  . ABDOMINAL HYSTERECTOMY     BSO  . APPENDECTOMY    . CESAREAN SECTION     x 2  . CHOLECYSTECTOMY    . CHONDROPLASTY Left 01/06/2015   Procedure: CHONDROPLASTY;  Surgeon: Marybelle Killings, MD;  Location: [facility];  Service: Orthopedics;  Laterality: Left;  . COLONOSCOPY    . ELBOW FRACTURE SURGERY     right  . KNEE ARTHROSCOPY WITH EXCISION PLICA Left 1/75/1025   Procedure: KNEE ARTHROSCOPY WITH EXCISION PLICA;  Surgeon: Marybelle Killings, MD;  Location: [facility];  Service: Orthopedics;  Laterality: Left;  . Lower Extremity Arterial Dopplers  07/06/2013   RABI 1.0, LABI 1.1.; No evidence of significant vascular atherogenic plaque  . NECK SURGERY    . NM MYOVIEW LTD  09/2014   LOW RISK. Normal EF of 65% with no regional wall motion abnormalities. No ischemia or  infarction.  . TONSILLECTOMY    . WRIST FRACTURE SURGERY     left, has plate    reports that she has quit smoking. Her smoking use included cigarettes. She started smoking about 40 years ago. She has a 39.00 pack-year smoking history. She has never used smokeless tobacco. She reports that she does not drink alcohol or use drugs. family history includes Dementia in her mother; Diabetes in her maternal aunt, maternal grandmother, and unknown relative; Emphysema in her father; Heart disease in her brother. Allergies  Allergen Reactions  . Bee Venom Swelling  . Codeine Nausea Only and Other (See Comments)    CAUSES ULCERS  . Ibuprofen Nausea And Vomiting  . Tramadol Nausea Only  . Hydrocodone-Acetaminophen Nausea Only  . Latex Rash  . Levofloxacin  Nausea Only  . Propoxyphene N-Acetaminophen Nausea Only   Current Outpatient Medications on File Prior to Visit  Medication Sig Dispense Refill  . ALPRAZolam (XANAX) 1 MG tablet TAKE 1 TABLET(1 MG) BY MOUTH DAILY AS NEEDED FOR ANXIETY 30 tablet 2  . aspirin 81 MG EC tablet Take 1 tablet (81 mg total) by mouth daily. Swallow whole. 30 tablet 12  . cetirizine (ZYRTEC) 10 MG tablet Take 1 tablet (10 mg total) by mouth daily. 30 tablet 5  . DEXILANT 60 MG capsule TAKE 1 CAPSULE(60 MG) BY MOUTH DAILY 30 capsule 5  . EPINEPHrine 0.3 mg/0.3 mL IJ SOAJ injection INJECT AS DIRECTED FOR SEVERE ALLERGIC REACTION 2 Device 2  . levETIRAcetam (KEPPRA) 250 MG tablet TK 1 T PO BID  1  . meclizine (ANTIVERT) 12.5 MG tablet Take 1 tablet (12.5 mg total) by mouth 3 (three) times daily as needed for dizziness. 30 tablet 2  . montelukast (SINGULAIR) 10 MG tablet Take 1 tablet (10 mg total) by mouth at bedtime. 30 tablet 5  . nitroGLYCERIN (NITROSTAT) 0.3 MG SL tablet Place 0.3 mg under the tongue every 5 (five) minutes as needed for chest pain.    . NONFORMULARY OR COMPOUNDED ITEM GI COCKTAIL 15ml 2 % lidocaine 85ml Dicyclomine 10mg /78ml 299ml Miralax   TAKE 1-2 TIMES DAILY AS NEEDED 450 each 0  . oxyCODONE (OXY IR/ROXICODONE) 5 MG immediate release tablet Take 1 tablet (5 mg total) by mouth every 6 (six) hours as needed for severe pain. 30 tablet 0  . oxyCODONE-acetaminophen (PERCOCET) 10-325 MG tablet Take 1 tablet by mouth every 8 (eight) hours as needed for pain. 5 tablet 0  . pantoprazole (PROTONIX) 20 MG tablet Take 1 tablet (20 mg total) by mouth daily. 30 tablet 0  . PROAIR HFA 108 (90 Base) MCG/ACT inhaler Inhale 1-2 puffs into the lungs every 4 (four) hours as needed for shortness of breath or wheezing. 1 Inhaler 1  . rizatriptan (MAXALT-MLT) 10 MG disintegrating tablet Take 1 tablet (10 mg total) by mouth as needed for migraine. May repeat in 2 hours if needed 9 tablet 11  . rosuvastatin (CRESTOR) 40 MG  tablet Take 1 tablet (40 mg total) by mouth daily. 90 tablet 3  . topiramate (TOPAMAX) 50 MG tablet Take 1 tablet (50 mg total) by mouth 2 (two) times daily. 60 tablet 12  . valACYclovir (VALTREX) 500 MG tablet TAKE 1 TABLET(500 MG) BY MOUTH DAILY 30 tablet 11   No current facility-administered medications on file prior to visit.    Review of Systems  Constitutional: Negative for other unusual diaphoresis or sweats HENT: Negative for ear discharge or swelling Eyes: Negative for other worsening visual disturbances Respiratory:  Negative for stridor or other swelling  Gastrointestinal: Negative for worsening distension or other blood Genitourinary: Negative for retention or other urinary change Musculoskeletal: Negative for other MSK pain or swelling Skin: Negative for color change or other new lesions Neurological: Negative for worsening tremors and other numbness  Psychiatric/Behavioral: Negative for worsening agitation or other fatigue All other system neg per pt    Objective:   Physical Exam BP 124/82   Pulse 68   Temp 98.5 F (36.9 C) (Oral)   Ht 5\' 2"  (1.575 m)   Wt 136 lb (61.7 kg)   SpO2 98%   BMI 24.87 kg/m  VS noted, mild ill Constitutional: Pt appears in NAD HENT: Head: NCAT.  Right Ear: External ear normal.  Left Ear: External ear normal.  Bilat tm's with mild erythema.  Max sinus areas non tender.  Pharynx with mild erythema, no exudate Eyes: . Pupils are equal, round, and reactive to light. Conjunctivae and EOM are normal Nose: without d/c or deformity Neck: Neck supple. Gross normal ROM Cardiovascular: Normal rate and regular rhythm.   Pulmonary/Chest: Effort normal and breath sounds decreased without rales but with few bilat wheezing. and course BS Abd:  Soft, NT, ND, + BS, no organomegaly Neurological: Pt is alert. At baseline orientation, motor grossly intact Skin: Skin is warm. No rashes, other new lesions, no LE edema Psychiatric: Pt behavior is normal  without agitation, nervous   No other exam findings  Lab Results  Component Value Date   WBC 7.7 12/29/2017   HGB 11.1 (L) 12/29/2017   HCT 34.9 (L) 12/29/2017   PLT 295 12/29/2017   GLUCOSE 108 (H) 12/29/2017   CHOL 260 (H) 12/15/2017   TRIG (H) 12/15/2017    505.0 Triglyceride is over 400; calculations on Lipids are invalid.   HDL 31.50 (L) 12/15/2017   LDLDIRECT 161.0 12/15/2017   LDLCALC Comment 03/12/2017   ALT 15 12/29/2017   AST 16 12/29/2017   NA 138 12/29/2017   K 4.2 12/29/2017   CL 107 12/29/2017   CREATININE 0.96 12/29/2017   BUN 8 12/29/2017   CO2 23 12/29/2017   TSH 3.48 12/15/2017   INR 0.9 08/31/2008   HGBA1C 5.5 06/21/2016       Assessment & Plan:

## 2018-04-27 NOTE — Assessment & Plan Note (Signed)
Quit smoking, urged to cont to abstain

## 2018-04-27 NOTE — Assessment & Plan Note (Signed)
Ok for phenergan refill, cont dexilant

## 2018-04-29 ENCOUNTER — Encounter: Payer: Self-pay | Admitting: *Deleted

## 2018-04-29 ENCOUNTER — Telehealth: Payer: Self-pay

## 2018-04-29 DIAGNOSIS — J3089 Other allergic rhinitis: Secondary | ICD-10-CM | POA: Diagnosis not present

## 2018-04-29 NOTE — Progress Notes (Signed)
Vials made. Exp: 04-30-19. hv 

## 2018-04-29 NOTE — Telephone Encounter (Signed)
-----   Message from Biagio Borg, MD sent at 04/27/2018  7:59 PM EDT ----- Left message on MyChart, pt to cont same tx except  The test results show that your current treatment is OK, except the LDL cholesterol is very high, and the Vitamin b12 is quite low.  Please take the cholesterol medication as prescribed, and please start monthly B12 shots by making a Nurse Visit appointment starting as soon as you can.  I will send the prescription, and you should hear from the office as well.   Richelle Glick to please inform pt, I will do rx and please have pt start coming for monthly b12 shots

## 2018-04-29 NOTE — Telephone Encounter (Signed)
Pt has been informed and expressed understanding. Nurse visit for B12 shot set up for Friday 05/01/18

## 2018-04-30 DIAGNOSIS — J3081 Allergic rhinitis due to animal (cat) (dog) hair and dander: Secondary | ICD-10-CM | POA: Diagnosis not present

## 2018-05-01 ENCOUNTER — Ambulatory Visit (INDEPENDENT_AMBULATORY_CARE_PROVIDER_SITE_OTHER): Payer: Medicare Other

## 2018-05-01 DIAGNOSIS — E538 Deficiency of other specified B group vitamins: Secondary | ICD-10-CM

## 2018-05-01 DIAGNOSIS — H524 Presbyopia: Secondary | ICD-10-CM | POA: Diagnosis not present

## 2018-05-01 DIAGNOSIS — D3132 Benign neoplasm of left choroid: Secondary | ICD-10-CM | POA: Diagnosis not present

## 2018-05-01 MED ORDER — CYANOCOBALAMIN 1000 MCG/ML IJ SOLN
1000.0000 ug | Freq: Once | INTRAMUSCULAR | Status: AC
  Administered 2018-05-01: 1000 ug via INTRAMUSCULAR

## 2018-05-01 NOTE — Progress Notes (Signed)
Medical screening examination/treatment/procedure(s) were performed by non-physician practitioner and as supervising physician I was immediately available for consultation/collaboration. I agree with above. Amnah Breuer, MD   

## 2018-05-03 ENCOUNTER — Other Ambulatory Visit: Payer: Self-pay | Admitting: Cardiology

## 2018-05-11 ENCOUNTER — Ambulatory Visit: Payer: Self-pay

## 2018-05-11 ENCOUNTER — Ambulatory Visit (INDEPENDENT_AMBULATORY_CARE_PROVIDER_SITE_OTHER): Payer: Medicare Other | Admitting: *Deleted

## 2018-05-11 DIAGNOSIS — S6992XA Unspecified injury of left wrist, hand and finger(s), initial encounter: Secondary | ICD-10-CM | POA: Diagnosis not present

## 2018-05-11 DIAGNOSIS — J309 Allergic rhinitis, unspecified: Secondary | ICD-10-CM | POA: Diagnosis not present

## 2018-05-20 ENCOUNTER — Ambulatory Visit: Payer: Medicare Other | Admitting: Allergy & Immunology

## 2018-05-25 ENCOUNTER — Ambulatory Visit (INDEPENDENT_AMBULATORY_CARE_PROVIDER_SITE_OTHER): Payer: Medicare Other | Admitting: *Deleted

## 2018-05-25 DIAGNOSIS — J309 Allergic rhinitis, unspecified: Secondary | ICD-10-CM

## 2018-05-29 ENCOUNTER — Ambulatory Visit (INDEPENDENT_AMBULATORY_CARE_PROVIDER_SITE_OTHER): Payer: Medicare Other | Admitting: Emergency Medicine

## 2018-05-29 DIAGNOSIS — D509 Iron deficiency anemia, unspecified: Secondary | ICD-10-CM

## 2018-05-29 MED ORDER — CYANOCOBALAMIN 1000 MCG/ML IJ SOLN
1000.0000 ug | Freq: Once | INTRAMUSCULAR | Status: AC
  Administered 2018-05-29: 1000 ug via INTRAMUSCULAR

## 2018-05-29 NOTE — Progress Notes (Signed)
Medical screening examination/treatment/procedure(s) were performed by non-physician practitioner and as supervising physician I was immediately available for consultation/collaboration. I agree with above. Etienne Millward, MD   

## 2018-06-01 ENCOUNTER — Telehealth: Payer: Self-pay

## 2018-06-01 DIAGNOSIS — M79645 Pain in left finger(s): Secondary | ICD-10-CM

## 2018-06-01 NOTE — Telephone Encounter (Signed)
Ok this is done 

## 2018-06-01 NOTE — Telephone Encounter (Signed)
Copied from [place] [id number]. Topic: Referral - Request >> Jun 01, 2018  2:57 PM Rutherford Nail, [place] wrote: Reason for CRM: patient calling and states that she is needing a referral to the orthopedic doctor for her pinky. Requesting the referral go to [facility] >> Jun 01, 2018  3:09 PM Para Skeans A wrote: LOV: 04/2018

## 2018-06-01 NOTE — Addendum Note (Signed)
Addended by: Biagio Borg on: 06/01/2018 08:38 PM   Modules accepted: Orders

## 2018-06-02 ENCOUNTER — Telehealth: Payer: Self-pay

## 2018-06-02 DIAGNOSIS — H2513 Age-related nuclear cataract, bilateral: Secondary | ICD-10-CM | POA: Diagnosis not present

## 2018-06-02 DIAGNOSIS — D3132 Benign neoplasm of left choroid: Secondary | ICD-10-CM | POA: Diagnosis not present

## 2018-06-02 NOTE — Telephone Encounter (Signed)
Copied from [place] [id number]. Topic: Referral - Request >> Jun 01, 2018  2:57 PM Rutherford Nail, [place] wrote: Reason for CRM: patient calling and states that she is needing a referral to the orthopedic doctor for her pinky. Requesting the referral go to [facility] >> Jun 01, 2018  3:09 PM Para Skeans A wrote: LOV: 04/2018  >> Jun 02, 2018  3:20 PM Judyann Munson wrote: Patient stated she contact [facility] and they advise her to have someone from our office give them a call in regards to the referral

## 2018-06-02 NOTE — Telephone Encounter (Signed)
I certainly hope someone does this, but I dont do phone consults  Pt has been referred yesterday to [facility], and the referral process often takes more than one day to be done

## 2018-06-03 NOTE — Telephone Encounter (Signed)
Called [facility] and pt has already been scheduled for 9/19 @ 8:45. They scheduled with pt

## 2018-06-04 DIAGNOSIS — M25532 Pain in left wrist: Secondary | ICD-10-CM | POA: Diagnosis not present

## 2018-06-08 ENCOUNTER — Ambulatory Visit (INDEPENDENT_AMBULATORY_CARE_PROVIDER_SITE_OTHER): Payer: Medicare Other

## 2018-06-08 DIAGNOSIS — J309 Allergic rhinitis, unspecified: Secondary | ICD-10-CM

## 2018-06-13 IMAGING — NM NM BONE 3 PHASE
2 series · 12 of 12 positions shown · non-contrast
Comparison: None

CLINICAL DATA: LEFT knee pain, injury 2 years ago, pain radiating
up LEFT thigh and down to ankle, some RIGHT patellar pain as well
without radiation

EXAM:
NUCLEAR MEDICINE 3-PHASE BONE SCAN
TECHNIQUE: Radionuclide angiographic images, immediate static blood pool
images, and 3-hour delayed static images were obtained of the knees
after intravenous injection of radiopharmaceutical.
RADIOPHARMACEUTICALS:  25.8 mCi [id number]-VVm MDP

[Series 1: flow · 4.14mm/px · 6 of 39 frames shown (1 of 2)]
[frame 4/39  full-range]
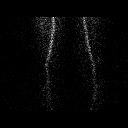
[frame 10/39  full-range]
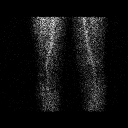
[frame 17/39  full-range]
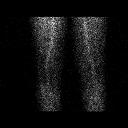
[frame 23/39  full-range]
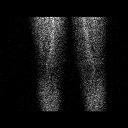
[frame 30/39  full-range]
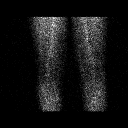
[frame 36/39  full-range]
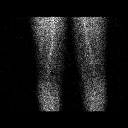

[Series 1: flow · 4.14mm/px · 6 of 40 frames shown (2 of 2)]
[frame 4/40  full-range]
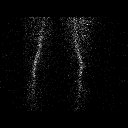
[frame 10/40  full-range]
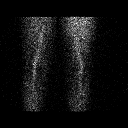
[frame 17/40  full-range]
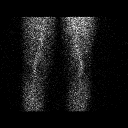
[frame 24/40  full-range]
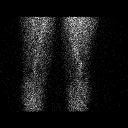
[frame 30/40  full-range]
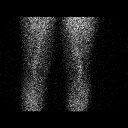
[frame 37/40  full-range]
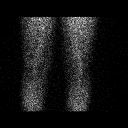

[12 of 12 positions shown; findings below may reference images not displayed]

Radiographic correlation: LEFT knee radiographs 12/07/2015, RIGHT
knee radiographs 01/05/2006, MRI LEFT knee 12/19/2015
FINDINGS: Vascular phase: Normal blood flow to both knees

Blood pool phase: Normal blood pool at both knees

Delayed phase: Minimal uptake at the knees bilaterally typically
degenerative. No worrisome foci of abnormal osseous tracer
accumulation identified.
IMPRESSION: Minimal uptake of tracer at both knees typically degenerative.

No significant scintigraphic abnormalities identified at either
knee.

## 2018-06-16 ENCOUNTER — Ambulatory Visit: Payer: Medicare Other | Admitting: Internal Medicine

## 2018-06-19 ENCOUNTER — Ambulatory Visit (INDEPENDENT_AMBULATORY_CARE_PROVIDER_SITE_OTHER): Payer: Medicare Other

## 2018-06-19 DIAGNOSIS — J4550 Severe persistent asthma, uncomplicated: Secondary | ICD-10-CM

## 2018-06-19 MED ORDER — BENRALIZUMAB 30 MG/ML ~~LOC~~ SOSY
30.0000 mg | PREFILLED_SYRINGE | Freq: Once | SUBCUTANEOUS | Status: AC
  Administered 2018-06-19: 30 mg via SUBCUTANEOUS

## 2018-06-24 ENCOUNTER — Ambulatory Visit (INDEPENDENT_AMBULATORY_CARE_PROVIDER_SITE_OTHER): Payer: Medicare Other | Admitting: Family

## 2018-06-24 ENCOUNTER — Telehealth: Payer: Self-pay | Admitting: Internal Medicine

## 2018-06-24 ENCOUNTER — Encounter: Payer: Self-pay | Admitting: Family

## 2018-06-24 ENCOUNTER — Ambulatory Visit (INDEPENDENT_AMBULATORY_CARE_PROVIDER_SITE_OTHER): Payer: Medicare Other | Admitting: *Deleted

## 2018-06-24 VITALS — BP 114/70 | HR 80 | Temp 98.1°F | Ht 62.0 in | Wt 137.0 lb

## 2018-06-24 DIAGNOSIS — J441 Chronic obstructive pulmonary disease with (acute) exacerbation: Secondary | ICD-10-CM

## 2018-06-24 DIAGNOSIS — R062 Wheezing: Secondary | ICD-10-CM

## 2018-06-24 DIAGNOSIS — J309 Allergic rhinitis, unspecified: Secondary | ICD-10-CM

## 2018-06-24 DIAGNOSIS — E538 Deficiency of other specified B group vitamins: Secondary | ICD-10-CM | POA: Diagnosis not present

## 2018-06-24 DIAGNOSIS — G43009 Migraine without aura, not intractable, without status migrainosus: Secondary | ICD-10-CM | POA: Diagnosis not present

## 2018-06-24 MED ORDER — DOXYCYCLINE HYCLATE 100 MG PO TABS: 100.0000 mg | ORAL_TABLET | Freq: Two times a day (BID) | ORAL | 0 refills | Status: DC

## 2018-06-24 MED ORDER — BENZONATATE 100 MG PO CAPS
100.0000 mg | ORAL_CAPSULE | Freq: Three times a day (TID) | ORAL | 0 refills | Status: DC | PRN
Start: 2018-06-24 — End: 2018-08-04

## 2018-06-24 MED ORDER — CYANOCOBALAMIN 1000 MCG/ML IJ SOLN
1000.0000 ug | Freq: Once | INTRAMUSCULAR | Status: AC
  Administered 2018-06-24: 1000 ug via INTRAMUSCULAR

## 2018-06-24 MED ORDER — ALPRAZOLAM 1 MG PO TABS: ORAL_TABLET | ORAL | 2 refills | Status: DC

## 2018-06-24 MED ORDER — METHYLPREDNISOLONE ACETATE 80 MG/ML IJ SUSP
80.0000 mg | Freq: Once | INTRAMUSCULAR | Status: AC
  Administered 2018-06-24: 80 mg via INTRAMUSCULAR

## 2018-06-24 NOTE — Telephone Encounter (Signed)
Done erx 

## 2018-06-24 NOTE — Progress Notes (Signed)
Lori [name] is a 62 y.o. female with the following history as recorded in EpicCare:  Patient Active Problem List   Diagnosis Date Noted  . Nausea 04/27/2018  . Anemia, iron deficiency 04/27/2018  . Intermittent paresthesia of hand and foot 04/27/2018  . Headache 03/21/2018  . Lumbar radiculitis 08/15/2017  . Degenerative disc disease, lumbar 07/21/2017  . Encounter for well adult exam with abnormal findings 06/30/2017  . Epigastric pain 06/30/2017  . Restrictive lung disease 06/30/2017  . Leg cramping 06/09/2017  . Elbow contusion 06/09/2017  . Right elbow pain 05/27/2017  . COPD mixed type ([place]) 01/31/2017  . Dizziness 01/20/2017  . Seasonal and perennial allergic rhinitis 01/01/2017  . Moderate persistent asthma, uncomplicated 42/59/5638  . Pulmonary emphysema ([place]) 12/03/2016  . Tobacco use disorder 12/03/2016  . Chronic seasonal allergic rhinitis 12/03/2016  . Medication management 11/29/2016  . Cough 11/23/2016  . Rash 11/23/2016  . Vertigo 10/22/2016  . Gastroesophageal reflux disease with esophagitis 10/22/2016  . Non compliance w medication regimen 08/01/2016  . Allergic contact dermatitis due to metals 08/01/2016  . Migraine without aura and without status migrainosus, not intractable 07/22/2016  . Spasm of muscle of lower back 06/21/2016  . Chronic rhinitis 06/19/2016  . Gait difficulty 06/21/2015  . Fibromyalgia 06/21/2015  . Conversion reaction 06/21/2015  . Depression 06/21/2015  . Syncope and collapse 06/21/2015  . Plica syndrome of left knee 01/06/2015  . Anxiety state 09/06/2014  . History of MI 09/06/2014  . Cigarette smoker 06/27/2013  . Borderline hypertension 06/27/2013  . Chronic low back pain with right-sided sciatica 06/14/2013  . Leg pain, bilateral 06/14/2013  . Gastroparesis 10/26/2009  . Musculoskeletal chest pain 05/02/2009  . Hyperlipidemia LDL goal <100 11/18/2008  . COPD exacerbation ([place]) 11/18/2008  . HEPATIC CYST 11/18/2008     Current Outpatient Medications  Medication Sig Dispense Refill  . aspirin 81 MG EC tablet Take 1 tablet (81 mg total) by mouth daily. Swallow whole. 30 tablet 12  . azithromycin (ZITHROMAX Z-PAK) 250 MG tablet 2 tab by mouth day 1, then 1 per day 6 tablet 1  . cetirizine (ZYRTEC) 10 MG tablet Take 1 tablet (10 mg total) by mouth daily. 30 tablet 5  . DEXILANT 60 MG capsule TAKE 1 CAPSULE(60 MG) BY MOUTH DAILY 30 capsule 5  . DULoxetine (CYMBALTA) 30 MG capsule Take 1 capsule (30 mg total) by mouth daily. 90 capsule 3  . EPINEPHrine 0.3 mg/0.3 mL IJ SOAJ injection INJECT AS DIRECTED FOR SEVERE ALLERGIC REACTION 2 Device 2  . FASENRA 30 MG/ML SOSY     . ibuprofen (ADVIL,MOTRIN) 800 MG tablet TK 1 T PO TID FOR 5 DAYS  0  . levETIRAcetam (KEPPRA) 250 MG tablet TK 1 T PO BID  1  . meclizine (ANTIVERT) 12.5 MG tablet Take 1 tablet (12.5 mg total) by mouth 3 (three) times daily as needed for dizziness. 30 tablet 2  . montelukast (SINGULAIR) 10 MG tablet Take 1 tablet (10 mg total) by mouth at bedtime. 30 tablet 5  . nitroGLYCERIN (NITROSTAT) 0.4 MG SL tablet Place 1 tablet (0.4 mg total) under the tongue every 5 (five) minutes as needed for chest pain. NEED OV. 25 tablet 0  . NONFORMULARY OR COMPOUNDED ITEM GI COCKTAIL 51ml 2 % lidocaine 56ml Dicyclomine 10mg /41ml 299ml Miralax   TAKE 1-2 TIMES DAILY AS NEEDED 450 each 0  . oxyCODONE (OXY IR/ROXICODONE) 5 MG immediate release tablet Take 1 tablet (5 mg total) by mouth every  6 (six) hours as needed for severe pain. 30 tablet 0  . oxyCODONE-acetaminophen (PERCOCET) 10-325 MG tablet Take 1 tablet by mouth every 8 (eight) hours as needed for pain. 5 tablet 0  . pantoprazole (PROTONIX) 20 MG tablet Take 1 tablet (20 mg total) by mouth daily. 30 tablet 0  . PAZEO 0.7 % SOLN INT 1 GTT IN OU D IN THE MORNING  12  . PROAIR HFA 108 (90 Base) MCG/ACT inhaler Inhale 1-2 puffs into the lungs every 4 (four) hours as needed for shortness of breath or wheezing. 1  Inhaler 1  . promethazine (PHENERGAN) 25 MG tablet Take 1 tablet (25 mg total) by mouth every 8 (eight) hours as needed for nausea or vomiting. 20 tablet 2  . RESTASIS 0.05 % ophthalmic emulsion INT 1 GTT IN EACH EYE BID  12  . rizatriptan (MAXALT-MLT) 10 MG disintegrating tablet Take 1 tablet (10 mg total) by mouth as needed for migraine. May repeat in 2 hours if needed 9 tablet 11  . rosuvastatin (CRESTOR) 40 MG tablet Take 1 tablet (40 mg total) by mouth daily. 90 tablet 3  . topiramate (TOPAMAX) 50 MG tablet Take 1 tablet (50 mg total) by mouth 2 (two) times daily. 60 tablet 12  . valACYclovir (VALTREX) 500 MG tablet TAKE 1 TABLET(500 MG) BY MOUTH DAILY 30 tablet 11  . ALPRAZolam (XANAX) 1 MG tablet TAKE 1 TABLET(1 MG) BY MOUTH DAILY AS NEEDED FOR ANXIETY 30 tablet 2  . benzonatate (TESSALON) 100 MG capsule Take 1 capsule (100 mg total) by mouth 3 (three) times daily as needed. 20 capsule 0  . doxycycline (VIBRA-TABS) 100 MG tablet Take 1 tablet (100 mg total) by mouth 2 (two) times daily. 20 tablet 0   No current facility-administered medications for this visit.     Allergies: Bee venom; Codeine; Ibuprofen; Tramadol; Hydrocodone-acetaminophen; Latex; Levofloxacin; and Propoxyphene n-acetaminophen  Past Medical History:  Diagnosis Date  . Acute MI ([place])    x3 - by report only. She has had 3 negative Myoview stress test with no evidence of prior infarct.  . Allergic rhinitis   . Allergy   . Anemia   . Ankle fracture 05/2016  . Anxiety   . Arthritis   . Asthma   . Barrett esophagus 2007  . Chest pain 05-02-2009   echo  EF 55%  . COPD (chronic obstructive pulmonary disease) with chronic bronchitis (HCC)    & Emphysema  . Depression   . Duodenitis without mention of hemorrhage 2007  . Esophageal reflux 2007  . Esophageal stricture   . Full dentures   . H/O hiatal hernia   . Hiatal hernia 6967,[id number]  . Hyperlipidemia   . Multiple fractures    from falls, fx rt. elbow, fx left  wrist, bilateral ankles  . Pneumonia 10/2016  . Restrictive lung disease 06/30/2017  . Ulcer     Past Surgical History:  Procedure Laterality Date  . ABDOMINAL HYSTERECTOMY     BSO  . APPENDECTOMY    . CESAREAN SECTION     x 2  . CHOLECYSTECTOMY    . CHONDROPLASTY Left 01/06/2015   Procedure: CHONDROPLASTY;  Surgeon: Marybelle Killings, MD;  Location: [facility];  Service: Orthopedics;  Laterality: Left;  . COLONOSCOPY    . ELBOW FRACTURE SURGERY     right  . KNEE ARTHROSCOPY WITH EXCISION PLICA Left 09/16/7508   Procedure: KNEE ARTHROSCOPY WITH EXCISION PLICA;  Surgeon: Marybelle Killings, MD;  Location: [facility];  Service: Orthopedics;  Laterality: Left;  . Lower Extremity Arterial Dopplers  07/06/2013   RABI 1.0, LABI 1.1.; No evidence of significant vascular atherogenic plaque  . NECK SURGERY    . NM MYOVIEW LTD  09/2014   LOW RISK. Normal EF of 65% with no regional wall motion abnormalities. No ischemia or infarction.  . TONSILLECTOMY    . WRIST FRACTURE SURGERY     left, has plate    Family History  Problem Relation Age of Onset  . Emphysema Father   . Dementia Mother   . Diabetes Maternal Grandmother   . Diabetes Maternal Aunt   . Diabetes Unknown        mat. cousin  . Heart disease Brother   . Colon cancer Neg Hx   . Allergic rhinitis Neg Hx   . Angioedema Neg Hx   . Asthma Neg Hx   . Atopy Neg Hx   . Eczema Neg Hx   . Immunodeficiency Neg Hx   . Urticaria Neg Hx     Social History   Tobacco Use  . Smoking status: Former Smoker    Packs/day: 1.00    Years: 39.00    Pack years: 39.00    Types: Cigarettes    Start date: 08/06/1977  . Smokeless tobacco: Never Used  . Tobacco comment: stopped 10/22.   Substance Use Topics  . Alcohol use: No    Alcohol/week: 0.0 standard drinks    Subjective:  Patient presents with concerns for chronic migraine and recurrent COPD problems; is accompanied by her sister; somewhat of a difficult  historian; Notes she has had chronic problems with migraines for years; was getting Botox injections from another provider but cannot continue; complaining of throbbing, light sensitivity, nausea; Has also started with cough/ congestion/ wheezing in the past 2-3 days; known COPD; + smoker; productive cough;   Also asks if she can have B12 injection today;      Objective:  Vitals:   06/24/18 0849  BP: 114/70  Pulse: 80  Temp: 98.1 F (36.7 C)  TempSrc: Oral  SpO2: 98%  Weight: 137 lb (62.1 kg)  Height: 5\' 2"  (1.575 m)    General: Well developed, well nourished, in no acute distress  Skin : Warm and dry.  Head: Normocephalic and atraumatic  Eyes: Sclera and conjunctiva clear; pupils round and reactive to light; extraocular movements intact  Ears: External normal; canals clear; tympanic membranes normal  Oropharynx: Pink, supple. No suspicious lesions  Neck: Supple without thyromegaly, adenopathy  Lungs: Respirations unlabored; wheezing noted in all 4 lobes CVS exam: normal rate and regular rhythm.  Neurologic: Alert and oriented; speech intact; face symmetrical; moves all extremities well; CNII-XII intact without focal deficit   Assessment:  1. COPD with acute exacerbation ([place])   2. Wheezing   3. Migraine without aura and without status migrainosus, not intractable   4. B12 deficiency     Plan:  1. & 2. Depo-Medrol IM 80 mg given in office today; Rx for Doxycycline 100 mg bid x 10 days; Rx for [employer]; stressed need to quit smoking; use her inhalers as prescribed and see her lung specialist due to recurrent flares; if no improvement, will get CXR; follow-up as needed. 3. Explained that Depo-Medrol should help with headache also;  4. B12 injection given as requested.   No follow-ups on file.  No orders of the defined types were placed in this encounter.   Requested Prescriptions  Signed Prescriptions Disp Refills  . doxycycline (VIBRA-TABS) 100 MG tablet 20  tablet 0    Sig: Take 1 tablet (100 mg total) by mouth 2 (two) times daily.  . benzonatate (TESSALON) 100 MG capsule 20 capsule 0    Sig: Take 1 capsule (100 mg total) by mouth 3 (three) times daily as needed.

## 2018-06-24 NOTE — Telephone Encounter (Signed)
ALPRAZolam (XANAX) 1 MG tablet   Patient is requesting a refill on this medication.

## 2018-06-29 ENCOUNTER — Other Ambulatory Visit: Payer: Self-pay | Admitting: *Deleted

## 2018-06-29 ENCOUNTER — Encounter: Payer: Self-pay | Admitting: Neurology

## 2018-06-29 DIAGNOSIS — G5602 Carpal tunnel syndrome, left upper limb: Secondary | ICD-10-CM

## 2018-06-29 NOTE — Telephone Encounter (Signed)
Patient aware.

## 2018-07-02 ENCOUNTER — Ambulatory Visit (INDEPENDENT_AMBULATORY_CARE_PROVIDER_SITE_OTHER): Payer: Medicare Other | Admitting: *Deleted

## 2018-07-02 DIAGNOSIS — J309 Allergic rhinitis, unspecified: Secondary | ICD-10-CM

## 2018-07-04 IMAGING — CR DG ANKLE COMPLETE 3+V*R*
3 series · 3 of 3 positions shown · non-contrast
Comparison: Right ankle radiograph 01/05/2006.

CLINICAL DATA: Fall.  Rolling injury of right ankle.

EXAM:
RIGHT ANKLE - COMPLETE 3+ VIEW

[x ankle ap right]
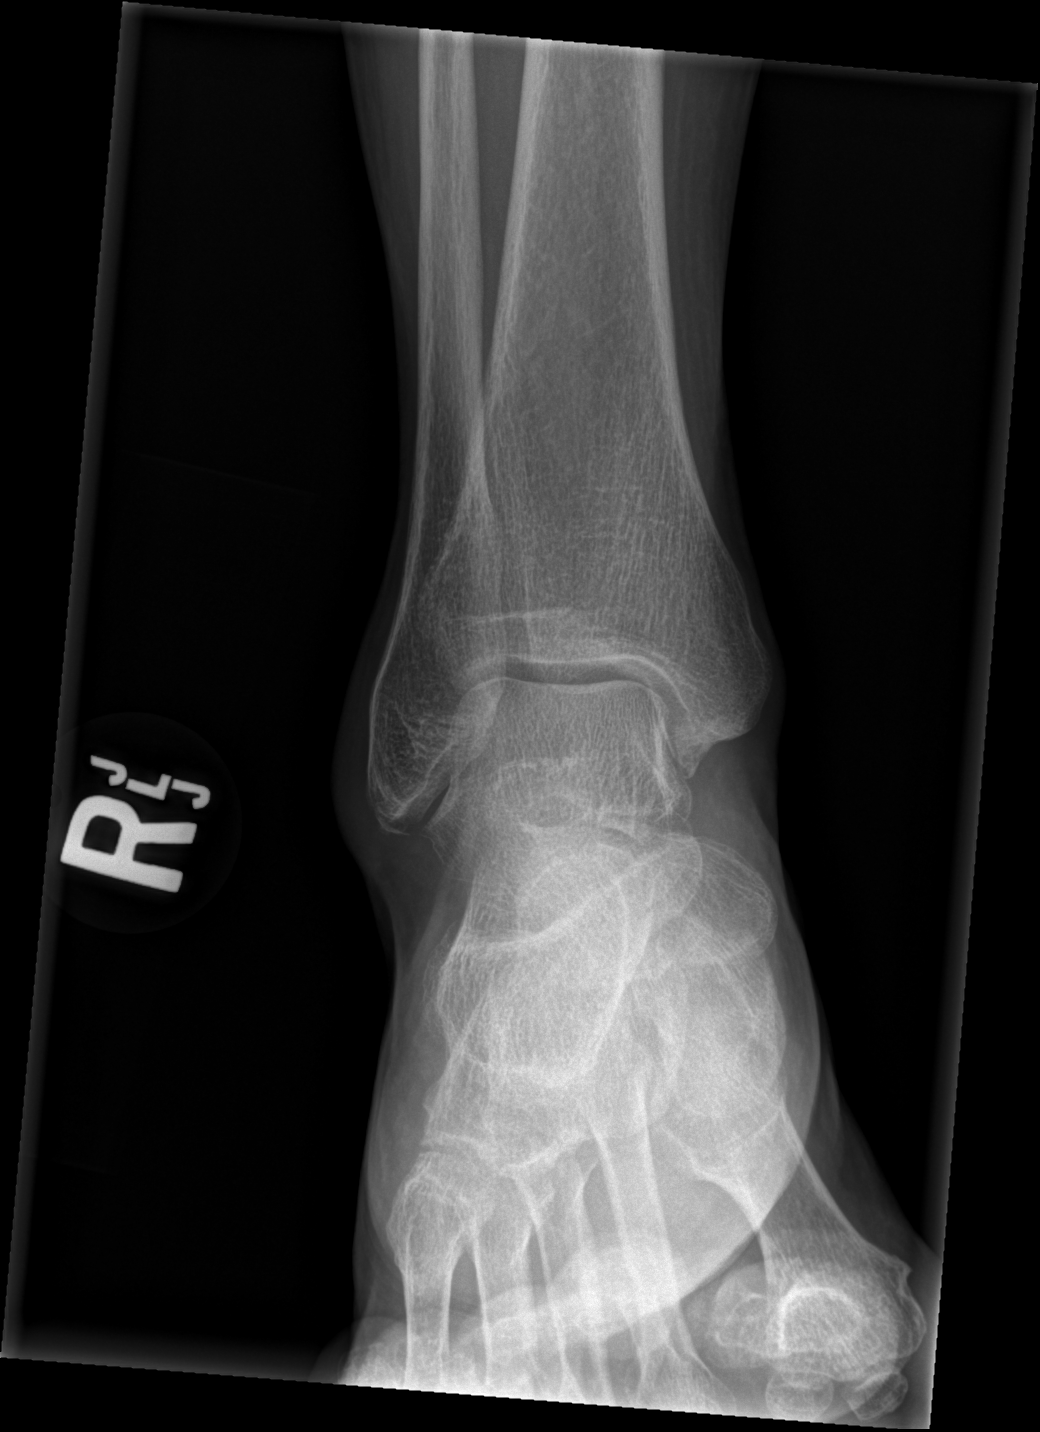

[x ankle obl right]
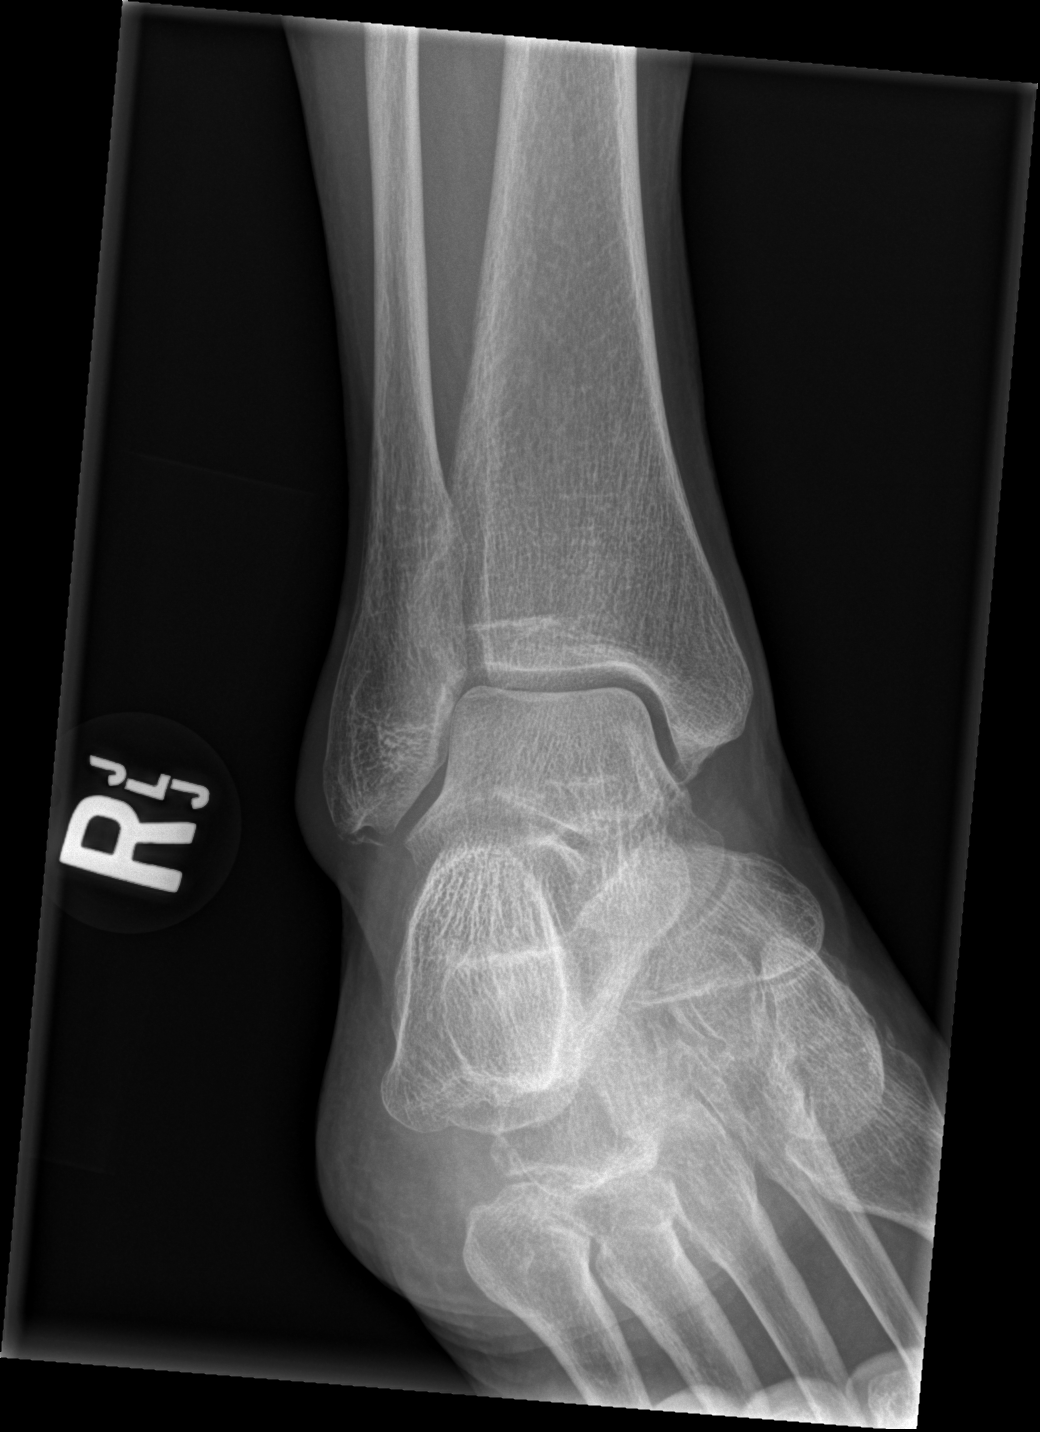

[x ankle lat right]
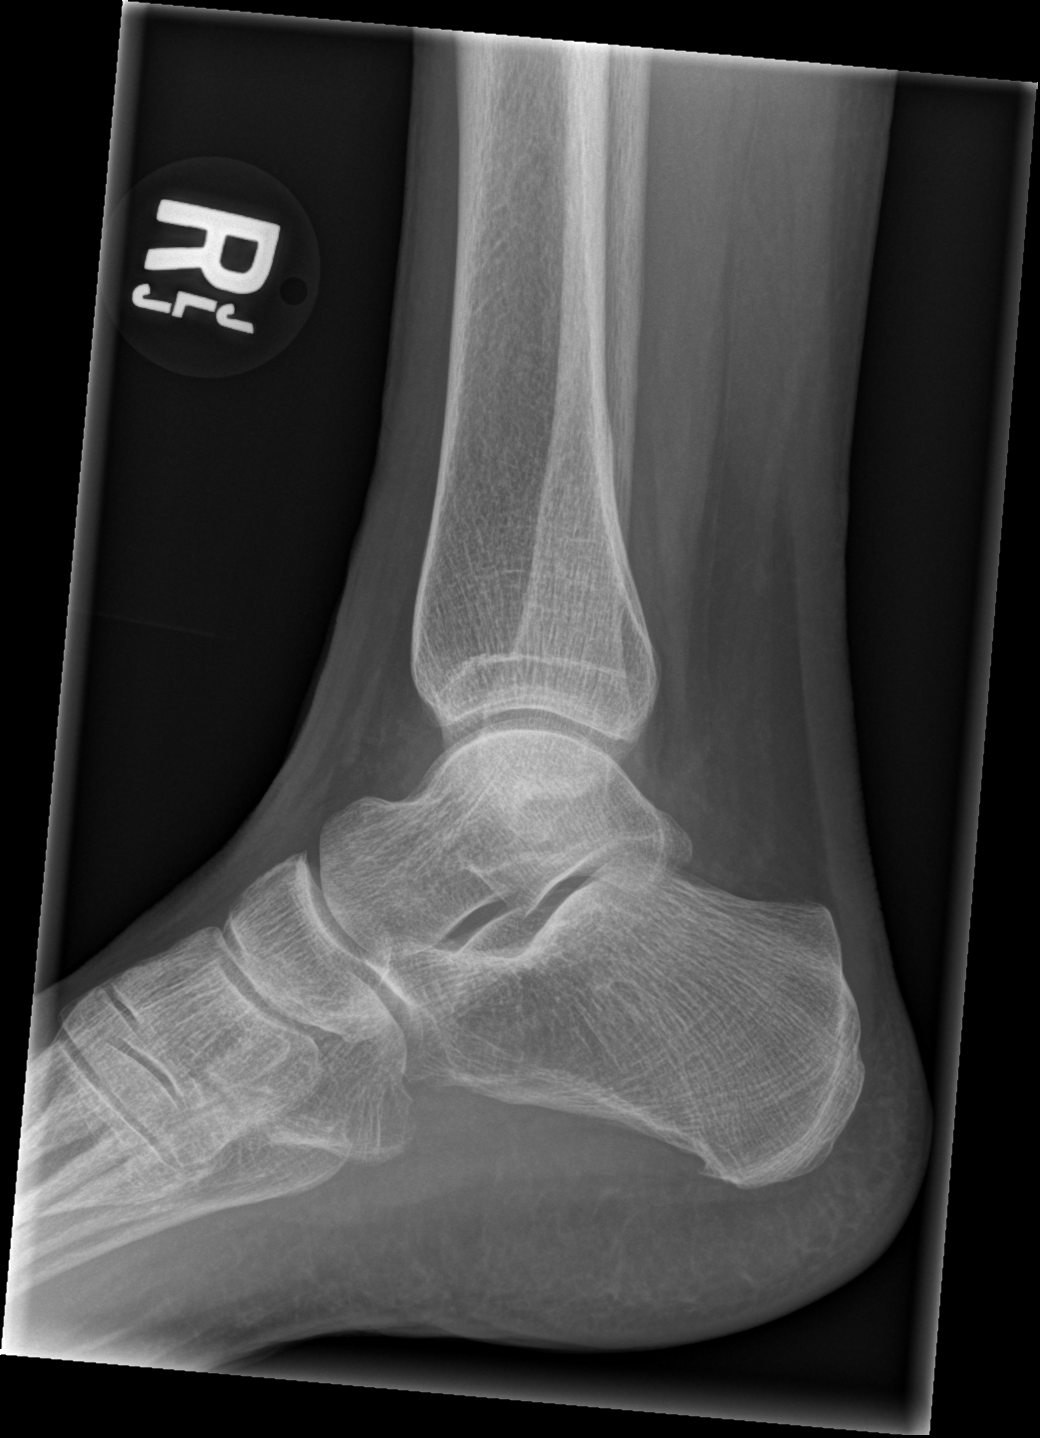

[3 of 3 positions shown; findings below may reference images not displayed]

FINDINGS: Small, crescent-shaped osseous fragment at the distal aspect of the
right lateral malleolus, likely minimally displaced avulsion
fracture fragment. No other evidence of fracture identified. The
ankle mortise is approximated. Moderate soft tissue swelling
overlying the lateral malleolus.
IMPRESSION: Minimally displaced avulsion fracture at the distal aspect of the
right fibula with overlying lateral malleolus soft tissue swelling.

## 2018-07-04 IMAGING — CR DG KNEE COMPLETE 4+V*L*
4 series · 4 of 4 positions shown · non-contrast
Comparison: Left knee radiograph 12/07/2015, 03/11/2015

CLINICAL DATA: Fall on left knee

EXAM:
LEFT KNEE - COMPLETE 4+ VIEW

[t knee ap left]
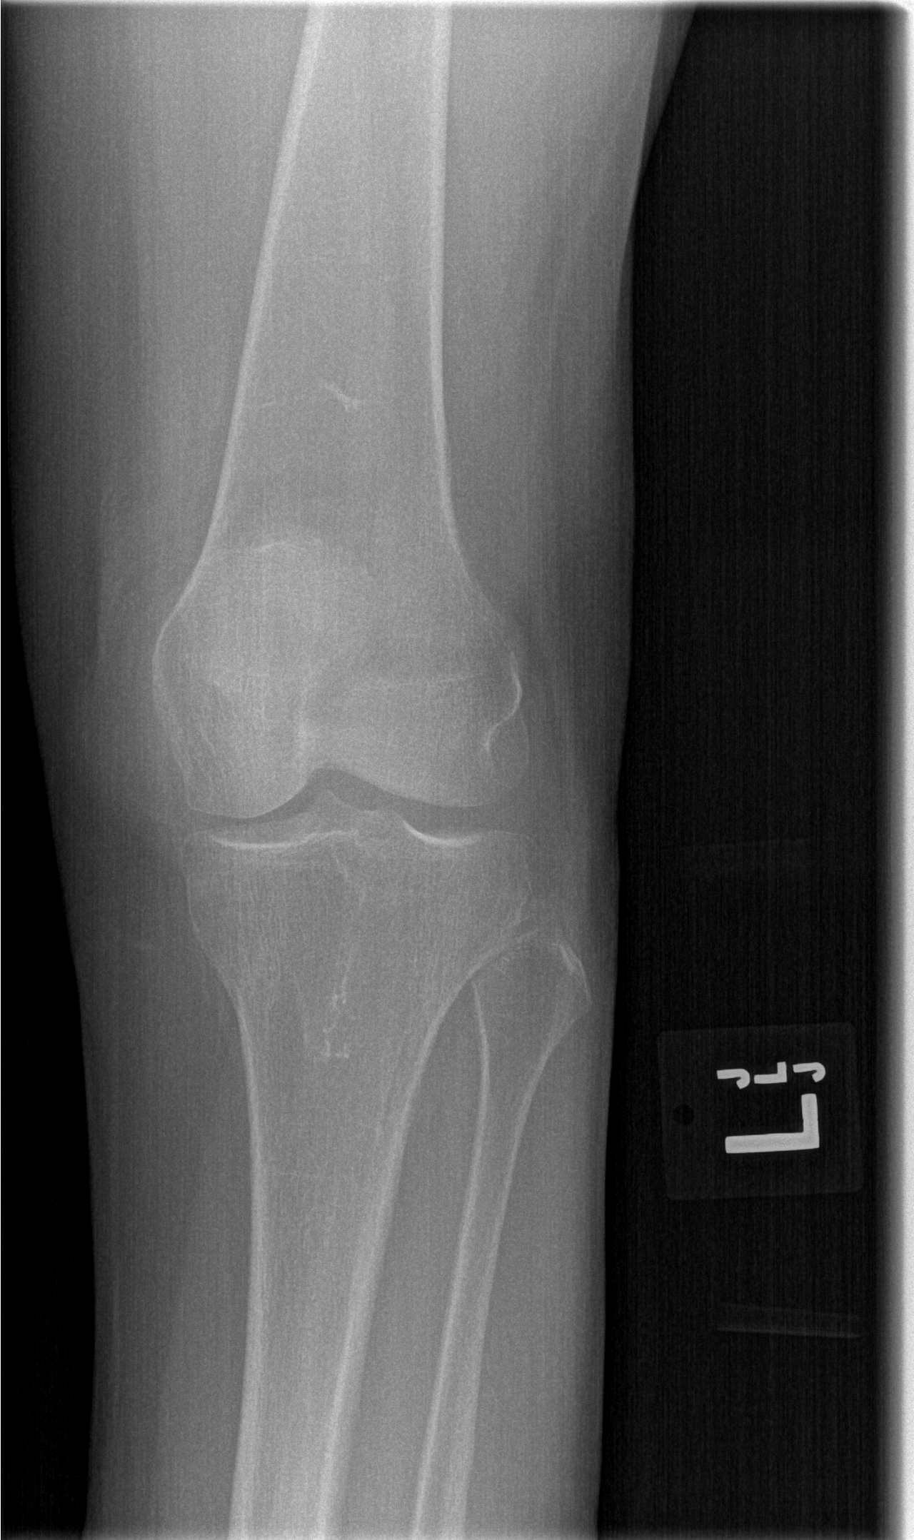

[t knee obl left (1 of 2)]
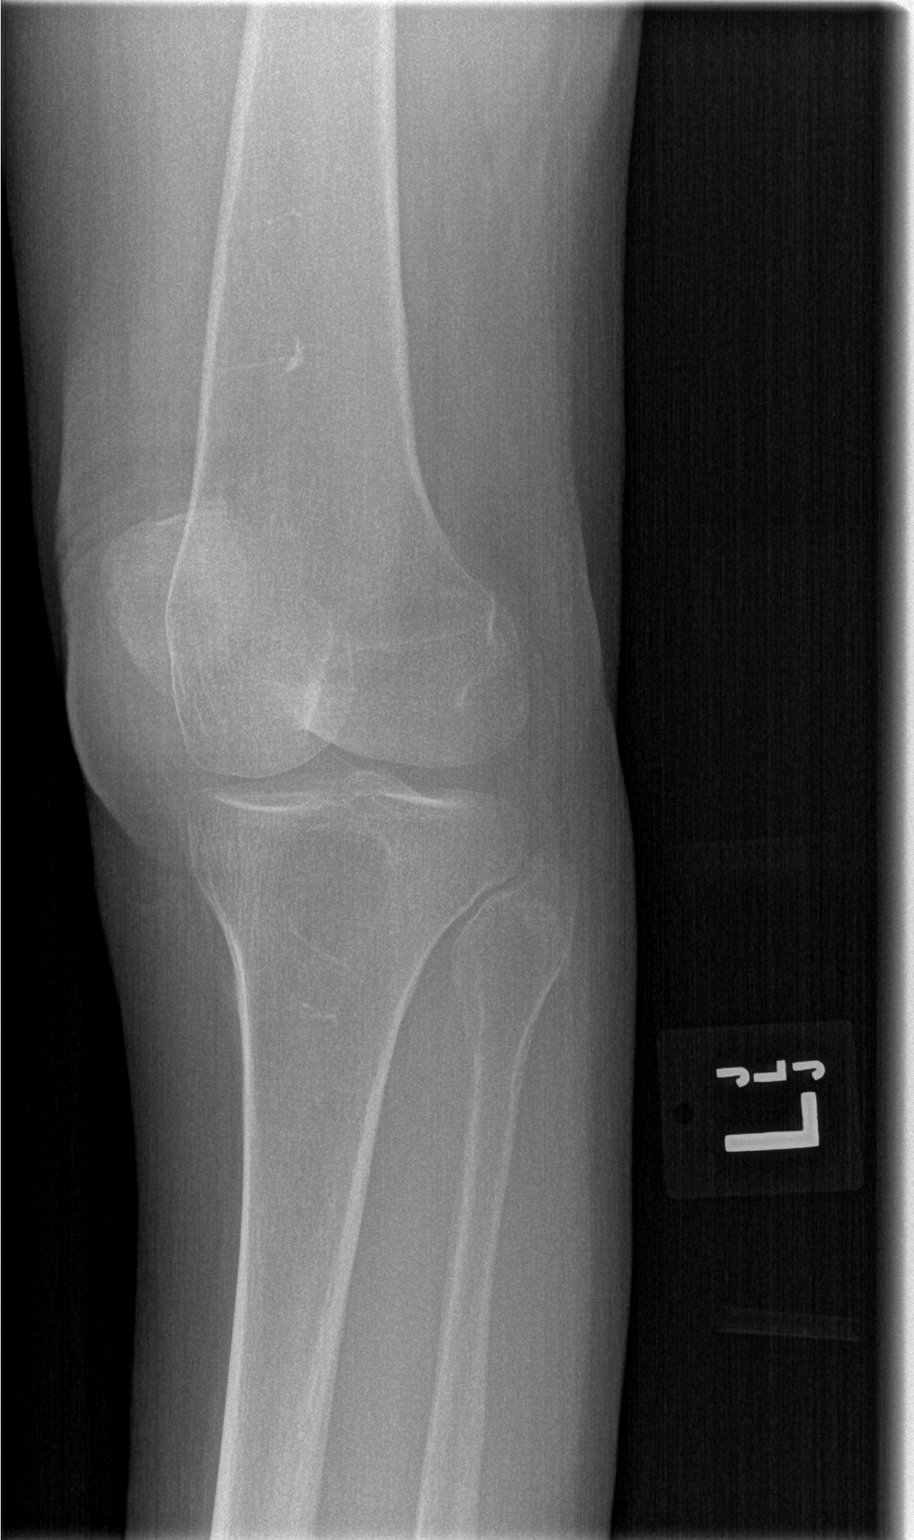

[t knee obl left (2 of 2)]
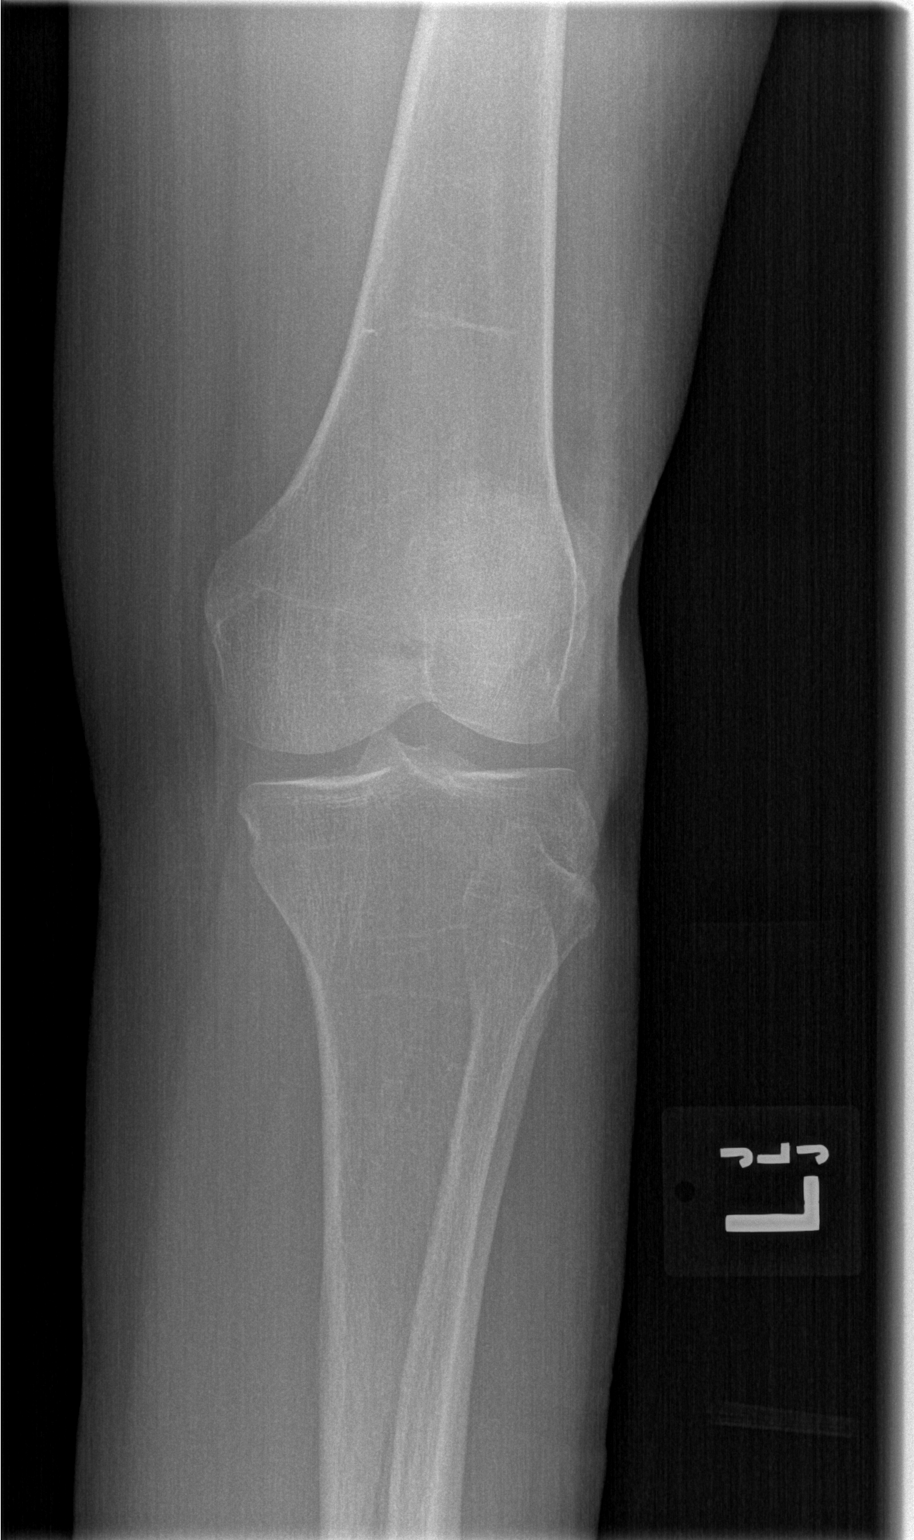

[t knee lat left]
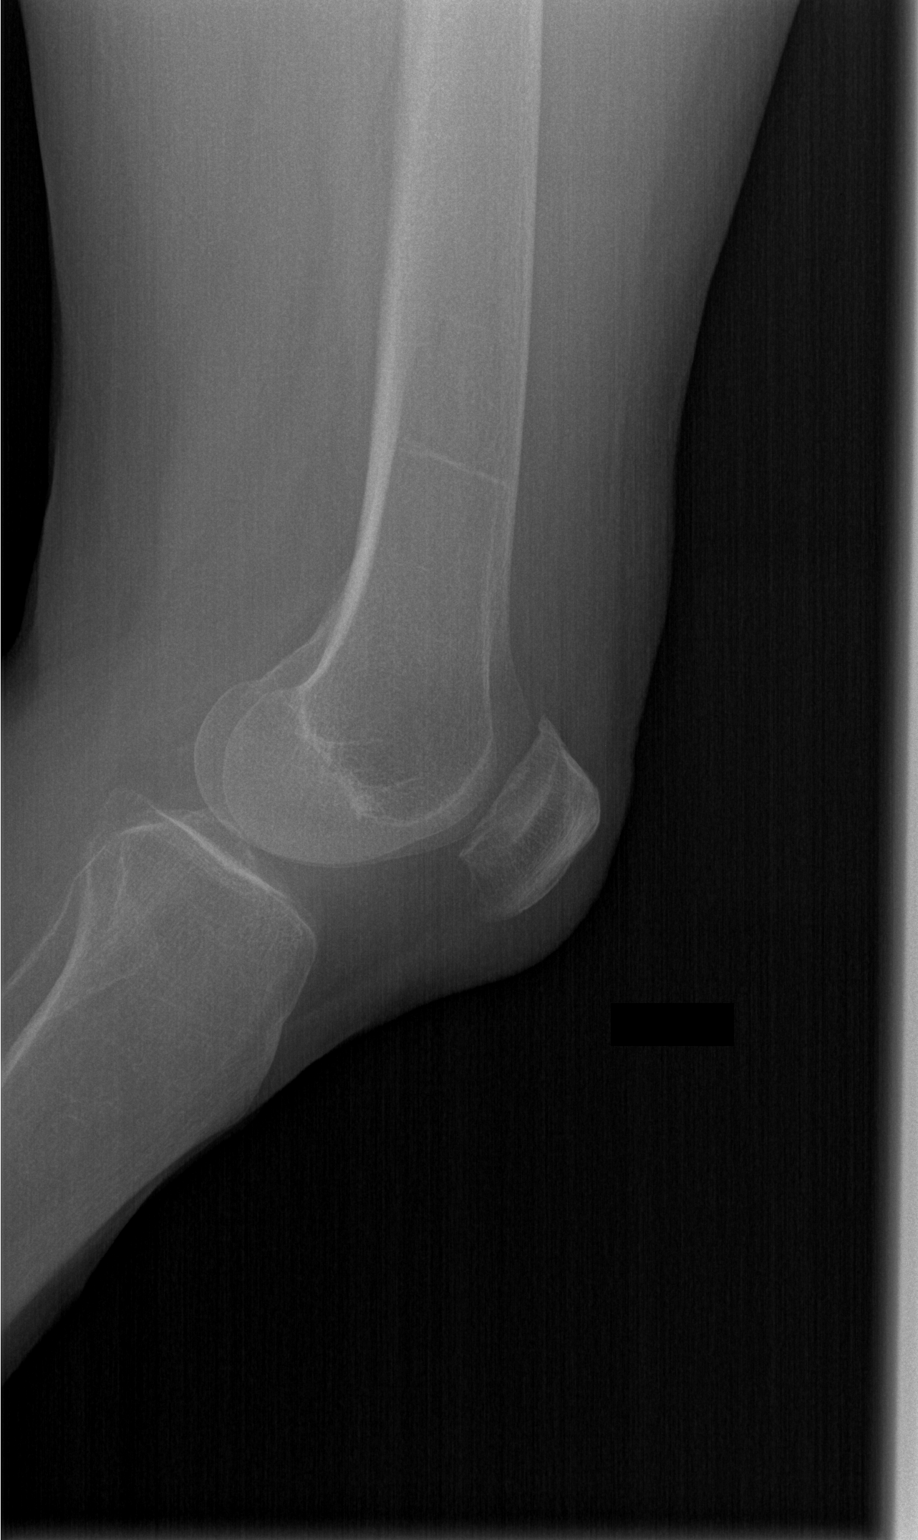

[4 of 4 positions shown; findings below may reference images not displayed]

FINDINGS: There is prepatellar soft tissue swelling. No sizable knee joint
effusion. There is mild degenerative spurring of the femorotibial
joint spaces, which are otherwise preserved. No acute fracture is
identified.
IMPRESSION: 1. No acute fracture or dislocation at the left knee.
2. Prepatellar soft tissue swelling without sizable joint effusion.

## 2018-07-07 ENCOUNTER — Ambulatory Visit: Payer: Medicare Other

## 2018-07-09 ENCOUNTER — Other Ambulatory Visit: Payer: Self-pay | Admitting: *Deleted

## 2018-07-09 ENCOUNTER — Ambulatory Visit (INDEPENDENT_AMBULATORY_CARE_PROVIDER_SITE_OTHER): Payer: Medicare Other

## 2018-07-09 DIAGNOSIS — J309 Allergic rhinitis, unspecified: Secondary | ICD-10-CM

## 2018-07-09 MED ORDER — FLUTICASONE PROPIONATE 93 MCG/ACT NA EXHU: 2.0000 | INHALANT_SUSPENSION | Freq: Two times a day (BID) | NASAL | 0 refills | Status: DC

## 2018-07-09 NOTE — Addendum Note (Signed)
Addended by: Orlene Erm on: 07/09/2018 10:01 AM   Modules accepted: Orders

## 2018-07-09 NOTE — Telephone Encounter (Addendum)
Entered in Error medication refill canceled at [facility] care.

## 2018-07-14 ENCOUNTER — Ambulatory Visit (INDEPENDENT_AMBULATORY_CARE_PROVIDER_SITE_OTHER): Payer: Medicare Other | Admitting: *Deleted

## 2018-07-14 DIAGNOSIS — H25812 Combined forms of age-related cataract, left eye: Secondary | ICD-10-CM | POA: Diagnosis not present

## 2018-07-14 DIAGNOSIS — J309 Allergic rhinitis, unspecified: Secondary | ICD-10-CM

## 2018-07-14 DIAGNOSIS — H2511 Age-related nuclear cataract, right eye: Secondary | ICD-10-CM | POA: Diagnosis not present

## 2018-07-20 ENCOUNTER — Other Ambulatory Visit: Payer: Self-pay | Admitting: Internal Medicine

## 2018-07-21 ENCOUNTER — Ambulatory Visit (INDEPENDENT_AMBULATORY_CARE_PROVIDER_SITE_OTHER): Payer: Medicare Other | Admitting: *Deleted

## 2018-07-21 DIAGNOSIS — J309 Allergic rhinitis, unspecified: Secondary | ICD-10-CM | POA: Diagnosis not present

## 2018-07-23 DIAGNOSIS — H2512 Age-related nuclear cataract, left eye: Secondary | ICD-10-CM | POA: Diagnosis not present

## 2018-07-28 ENCOUNTER — Ambulatory Visit (INDEPENDENT_AMBULATORY_CARE_PROVIDER_SITE_OTHER): Payer: Medicare Other | Admitting: Neurology

## 2018-07-28 ENCOUNTER — Ambulatory Visit (INDEPENDENT_AMBULATORY_CARE_PROVIDER_SITE_OTHER): Payer: Medicare Other | Admitting: *Deleted

## 2018-07-28 DIAGNOSIS — E538 Deficiency of other specified B group vitamins: Secondary | ICD-10-CM

## 2018-07-28 DIAGNOSIS — G5602 Carpal tunnel syndrome, left upper limb: Secondary | ICD-10-CM

## 2018-07-28 DIAGNOSIS — G5622 Lesion of ulnar nerve, left upper limb: Secondary | ICD-10-CM

## 2018-07-28 MED ORDER — CYANOCOBALAMIN 1000 MCG/ML IJ SOLN
1000.0000 ug | Freq: Once | INTRAMUSCULAR | Status: AC
  Administered 2018-07-28: 1000 ug via INTRAMUSCULAR

## 2018-07-28 NOTE — Procedures (Signed)
[facility]  [address]  [place], [place] [address] Tel: [phone] Fax:  [phone] Test Date:  07/28/2018  Patient: Lori [name] DOB: [birth year] Physician: Narda Amber, DO  Sex: Female Height: 5\' 4"  Ref Phys: Berle Mull, MD  ID#: [id number] Temp: 37.0C Technician:    Patient Complaints: This is a 62 year old female referred for evaluation of left hand paresthesias concerning for ulnar neuropathy.  NCV & EMG Findings: Extensive electrodiagnostic testing of the left upper extremity shows:  1. Left median, ulnar, dorsal ulnar cutaneous, and mixed palmar sensory responses are within normal limits. 2. Left median motor responses within normal limits.  Left ulnar motor response shows conduction velocity slowing across the elbow (A Elbow-B Elbow, 45 m/s), with normal latency and amplitude.   3. There is no evidence of active or chronic motor axonal loss changes affecting any of the tested muscles.  Motor unit configuration and recruitment pattern is within normal limits.    Impression: Left ulnar neuropathy with slowing across the elbow, purely demyelinating, and mild degree electrically.   ___________________________ Narda Amber, DO    Nerve Conduction Studies Anti Sensory Summary Table   Site NR Peak (ms) Norm Peak (ms) P-T Amp (V) Norm P-T Amp  Left DorsCutan Anti Sensory (Dorsum 5th MC)  37C  Wrist    2.0 <3.2 11.7 >5  Left Median Anti Sensory (2nd Digit)  37C  Wrist    2.6 <3.8 40.1 >10  Left Ulnar Anti Sensory (5th Digit)  37C  Wrist    2.7 <3.2 29.8 >5   Motor Summary Table   Site NR Onset (ms) Norm Onset (ms) O-P Amp (mV) Norm O-P Amp Site1 Site2 Delta-0 (ms) Dist (cm) Vel (m/s) Norm Vel (m/s)  Left Median Motor (Abd Poll Brev)  37C  Wrist    2.8 <4.0 6.1 >5 Elbow Wrist 4.6 27.0 59 >50  Elbow    7.4  6.1         Left Ulnar Motor (Abd Dig Minimi)  37C  Wrist    2.4 <3.1 9.1 >7 B Elbow Wrist 3.3 21.0 64 >50  B Elbow    5.7   8.2  A Elbow B Elbow 2.2 10.0 45 >50  A Elbow    7.9  8.0         Left Ulnar (FDI) Motor (1st DI)  37C  Wrist    3.5 <4.5 10.9 >7 B Elbow Wrist 3.2 21.0 66 >50  B Elbow    6.7  9.8  A Elbow B Elbow 1.9 10.0 53 >50  A Elbow    8.6  9.2          Comparison Summary Table   Site NR Peak (ms) Norm Peak (ms) P-T Amp (V) Site1 Site2 Delta-P (ms) Norm Delta (ms)  Left Median/Ulnar Palm Comparison (Wrist - 8cm)  37C  Median Palm    1.4 <2.2 81.6 Median Palm Ulnar Palm 0.3   Ulnar Palm    1.7 <2.2 23.9       EMG   Side Muscle Ins Act Fibs Psw Fasc Number Recrt Dur Dur. Amp Amp. Poly Poly. Comment  Left 1stDorInt Nml Nml Nml Nml Nml Nml Nml Nml Nml Nml Nml Nml N/A  Left ABD Dig Min Nml Nml Nml Nml Nml Nml Nml Nml Nml Nml Nml Nml N/A  Left FlexCarpiUln Nml Nml Nml Nml Nml Nml Nml Nml Nml Nml Nml Nml N/A  Left PronatorTeres Nml Nml Nml Nml Nml Nml  Nml Nml Nml Nml Nml Nml N/A  Left Biceps Nml Nml Nml Nml Nml Nml Nml Nml Nml Nml Nml Nml N/A  Left Triceps Nml Nml Nml Nml Nml Nml Nml Nml Nml Nml Nml Nml N/A  Left Deltoid Nml Nml Nml Nml Nml Nml Nml Nml Nml Nml Nml Nml N/A      Waveforms:

## 2018-07-30 NOTE — Telephone Encounter (Signed)
[facility] called to verify dosage on medication for patient advised medication was sent in error and to cancel rx

## 2018-07-31 ENCOUNTER — Other Ambulatory Visit: Payer: Self-pay

## 2018-07-31 ENCOUNTER — Telehealth: Payer: Self-pay | Admitting: Internal Medicine

## 2018-07-31 NOTE — Telephone Encounter (Signed)
Copied from [place] [phone]. Topic: Quick Communication - See Telephone Encounter >> Jul 31, 2018  9:56 AM Blase Mess A wrote: CRM for notification. See Telephone encounter for: 07/31/18.  Merleen Nicely from [employer] is calling regarding incontinent supplies sent a 2 page fax [facility] request for prior approval Question 19 & 20 need to be completed and signed by the provider. Phone number-[phone]

## 2018-07-31 NOTE — Patient Outreach (Signed)
[facility]) Care Management  07/31/2018  Lori [name] Sep 29, 1955 [id number]   Medication Adherence call to Lori [name] patient did not answer patient is due on Rosuvastatin 40 mg.Lori [name] is showing past due under [place].   [facility] Management Direct Dial [phone]  Fax [phone] [email or web address]

## 2018-08-02 DIAGNOSIS — H2511 Age-related nuclear cataract, right eye: Secondary | ICD-10-CM | POA: Diagnosis not present

## 2018-08-03 ENCOUNTER — Ambulatory Visit: Payer: Medicare Other | Admitting: Allergy & Immunology

## 2018-08-03 DIAGNOSIS — M79641 Pain in right hand: Secondary | ICD-10-CM | POA: Insufficient documentation

## 2018-08-03 DIAGNOSIS — M79642 Pain in left hand: Secondary | ICD-10-CM | POA: Diagnosis not present

## 2018-08-04 ENCOUNTER — Ambulatory Visit (INDEPENDENT_AMBULATORY_CARE_PROVIDER_SITE_OTHER): Payer: Medicare Other | Admitting: Allergy & Immunology

## 2018-08-04 ENCOUNTER — Encounter: Payer: Self-pay | Admitting: Allergy & Immunology

## 2018-08-04 VITALS — BP 120/72 | HR 104 | Temp 97.8°F | Ht 62.6 in | Wt 135.4 lb

## 2018-08-04 DIAGNOSIS — Z9119 Patient's noncompliance with other medical treatment and regimen: Secondary | ICD-10-CM

## 2018-08-04 DIAGNOSIS — Z91199 Patient's noncompliance with other medical treatment and regimen due to unspecified reason: Secondary | ICD-10-CM

## 2018-08-04 DIAGNOSIS — F17210 Nicotine dependence, cigarettes, uncomplicated: Secondary | ICD-10-CM | POA: Diagnosis not present

## 2018-08-04 DIAGNOSIS — J3089 Other allergic rhinitis: Secondary | ICD-10-CM

## 2018-08-04 DIAGNOSIS — J0101 Acute recurrent maxillary sinusitis: Secondary | ICD-10-CM | POA: Diagnosis not present

## 2018-08-04 DIAGNOSIS — J449 Chronic obstructive pulmonary disease, unspecified: Secondary | ICD-10-CM | POA: Diagnosis not present

## 2018-08-04 DIAGNOSIS — H66011 Acute suppurative otitis media with spontaneous rupture of ear drum, right ear: Secondary | ICD-10-CM

## 2018-08-04 MED ORDER — DOXYCYCLINE HYCLATE 100 MG PO CAPS: 100.0000 mg | ORAL_CAPSULE | Freq: Two times a day (BID) | ORAL | 0 refills | Status: AC
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
## 2018-08-04 NOTE — Patient Instructions (Addendum)
1. Moderate persistent asthma, uncomplicated - Lung testing looked slightly worse today, but you need to be taking your Symbicort.  - Daily controller medication(s): Singulair (montelukast) 10mg  daily + Symbicort 160/4.5 two puffs once daily with spacer + Fasenra every 8 weeks.  - Rescue medications: Xopenex 4 puffs every 4-6 hours as needed or Xopenex nebulizer one vial puffs every 4-6 hours as needed - Asthma control goals:  * Full participation in all desired activities (may need albuterol before activity) * Albuterol use two time or less a week on average (not counting use with activity) * Cough interfering with sleep two time or less a month * Oral steroids no more than once a year * No hospitalizations   2. Perennial allergic rhinitis  - Continue with shots every two weeks. - Continue with Singulair 10mg  daily. - Continue with cetirizine (Zyrtec) 10mg  daily to help with the sneezing and other allergy symptoms.   3. Right acute otitis media - We will send in a prescription for doxycycline 100mg  twice daily for two weeks. - If you continue to have problems after this, we will send you back to ENT.   4. Return in about 3 months (around 11/04/2018).   Please inform us of any Emergency Department visits, hospitalizations, or changes in symptoms. Call us before going to the ED for breathing or allergy symptoms since we might be able to fit you in for a sick visit. Feel free to contact us anytime with any questions, problems, or concerns.  It was a pleasure to see you again today!   Websites that have reliable patient information: 1. American Academy of Asthma, Allergy, and Immunology: www.aaaai.org 2. Food Allergy Research and Education (FARE): foodallergy.org 3. Mothers of Asthmatics: http://www.asthmacommunitynetwork.org 4. American College of Allergy, Asthma, and Immunology: www.acaai.org

## 2018-08-06 ENCOUNTER — Telehealth: Payer: Self-pay

## 2018-08-06 DIAGNOSIS — H2511 Age-related nuclear cataract, right eye: Secondary | ICD-10-CM | POA: Diagnosis not present

## 2018-08-06 MED ORDER — AMOXICILLIN-POT CLAVULANATE 875-125 MG PO TABS: 1.0000 | ORAL_TABLET | Freq: Two times a day (BID) | ORAL | 0 refills | Status: AC

## 2018-08-06 NOTE — Telephone Encounter (Signed)
I sent in Augmentin BID instead. Please let the patient know.  Salvatore Marvel, MD Allergy and [facility]

## 2018-08-06 NOTE — Telephone Encounter (Signed)
Patient called back with the same message about nausea.

## 2018-08-06 NOTE — Addendum Note (Signed)
Addended by: Lucrezia Starch I on: 08/06/2018 02:16 PM   Modules accepted: Orders

## 2018-08-06 NOTE — Telephone Encounter (Signed)
Patient called and informed that antibiotic is causing nausea and vomiting. She is requesting a new medication.    Please advise.

## 2018-08-06 NOTE — Telephone Encounter (Signed)
Called patient advised new rx sent in patient verbalized understanding

## 2018-08-06 NOTE — Telephone Encounter (Signed)
Called patient to inform her that message had been routed to Dr. Ernst Bowler.  Patient states she first took medication without eating and had nausea and vomiting.  Patient then took second dose with food and still had vomiting.  Patient states that Amoxicillin or PCN works better for her with her history of ulcers and hiatal hernia.  Informed her I would send additional information to Dr. Ernst Bowler.  Patient would like new medication sent to [facility].

## 2018-08-07 NOTE — Telephone Encounter (Signed)
Lori [name] with [facility] called and said that she received the order back for the incontinent supplies. She said that where it was initialed by the provider, it was not dated, she needs that dated as well beside the initials and faxed back to [phone]

## 2018-08-11 ENCOUNTER — Telehealth: Payer: Self-pay

## 2018-08-11 ENCOUNTER — Ambulatory Visit (INDEPENDENT_AMBULATORY_CARE_PROVIDER_SITE_OTHER): Payer: Medicare Other | Admitting: *Deleted

## 2018-08-11 DIAGNOSIS — M542 Cervicalgia: Secondary | ICD-10-CM | POA: Diagnosis not present

## 2018-08-11 DIAGNOSIS — J4550 Severe persistent asthma, uncomplicated: Secondary | ICD-10-CM

## 2018-08-11 DIAGNOSIS — G43719 Chronic migraine without aura, intractable, without status migrainosus: Secondary | ICD-10-CM | POA: Diagnosis not present

## 2018-08-11 DIAGNOSIS — M791 Myalgia, unspecified site: Secondary | ICD-10-CM | POA: Diagnosis not present

## 2018-08-11 NOTE — Telephone Encounter (Signed)
-----   Message from Biagio Borg, MD sent at 08/11/2018 10:59 AM EST ----- Ok to let pt know, that her refill request is too early, after the last one on Oct 9 was good for 3 total months, thanks ----- Message ----- From: Juliet Rude, CMA Sent: 08/11/2018   8:26 AM EST To: Biagio Borg, MD  Pt requesting xanax refill

## 2018-08-11 NOTE — Telephone Encounter (Signed)
Called pt, LVM with details below  

## 2018-08-12 ENCOUNTER — Other Ambulatory Visit: Payer: Self-pay | Admitting: Cardiology

## 2018-08-12 MED ORDER — BENRALIZUMAB 30 MG/ML ~~LOC~~ SOSY
30.0000 mg | PREFILLED_SYRINGE | SUBCUTANEOUS | Status: DC
  Administered 2018-08-11 – 2020-02-07 (×9): 30 mg via SUBCUTANEOUS

## 2018-08-14 ENCOUNTER — Ambulatory Visit: Payer: Self-pay

## 2018-08-18 ENCOUNTER — Ambulatory Visit (INDEPENDENT_AMBULATORY_CARE_PROVIDER_SITE_OTHER): Payer: Medicare Other | Admitting: *Deleted

## 2018-08-18 DIAGNOSIS — J309 Allergic rhinitis, unspecified: Secondary | ICD-10-CM

## 2018-08-19 ENCOUNTER — Encounter (INDEPENDENT_AMBULATORY_CARE_PROVIDER_SITE_OTHER): Payer: Self-pay | Admitting: Orthopedic Surgery

## 2018-08-19 ENCOUNTER — Ambulatory Visit (INDEPENDENT_AMBULATORY_CARE_PROVIDER_SITE_OTHER): Payer: Medicare Other | Admitting: Orthopedic Surgery

## 2018-08-19 DIAGNOSIS — G8929 Other chronic pain: Secondary | ICD-10-CM | POA: Diagnosis not present

## 2018-08-19 DIAGNOSIS — M545 Low back pain: Secondary | ICD-10-CM | POA: Diagnosis not present

## 2018-08-19 DIAGNOSIS — M542 Cervicalgia: Secondary | ICD-10-CM | POA: Diagnosis not present

## 2018-08-19 NOTE — Addendum Note (Signed)
Addended by: Mal Misty L on: 08/19/2018 12:51 PM   Modules accepted: Orders

## 2018-08-19 NOTE — Progress Notes (Signed)
Office Visit Note   Patient: Lori [name]           Date of Birth: [birth year]           MRN: [id number] Visit Date: 08/19/2018 Requested by: Biagio Borg, MD [place], [place] [address] PCP: Biagio Borg, MD  Subjective: Chief Complaint  Patient presents with  . Right Hand - Pain  . Left Hand - Pain  . Neck - Pain    HPI: Lori [name] is a patient with a lot of orthopedic issues.  She was recently seen by the hand surgeons who told her that based on their studies that she has left-sided radiculopathy from the neck.  She does describe burning in both hands which radiates into the neck.  She also reports muscle spasms.  Her radiographs of the cervical spine are reviewed.  They are paper copies and show extensive fusion of her cervical spine with no evidence of hardware compromise.  She is had 2 neck surgeries the last one done 6 years ago by Dr. Lorin [name].  Her neck hurts when she is doing movements.  She also reports fairly significant low back pain with out much radiation into the legs but with significant back symptoms.  Denies any bowel and bladder symptoms.  She has been taking some over-the-counter medication without much relief.  She has had surgery on that left wrist for prior distal radius fracture.              ROS: All systems reviewed are negative as they relate to the chief complaint within the history of present illness.  Patient denies  fevers or chills.   Assessment & Plan: Visit Diagnoses:  1. Chronic bilateral low back pain without sciatica   2. Cervicalgia     Plan: Impression is neck pain low back pain.  Based on the EMG nerve study report from the patient this is coming from her neck.  We will proceed with neck MRI scan for work-up.  I think she also needs back MRI scan of the lumbar spine due to the fairly incapacitating pain in this regard.  See her back after those studies.  Follow-Up Instructions: Return for after MRI.   Orders:  No orders of the  defined types were placed in this encounter.  No orders of the defined types were placed in this encounter.     Procedures: No procedures performed   Clinical Data: No additional findings.  Objective: Vital Signs: There were no vitals taken for this visit.  Physical Exam:   Constitutional: Patient appears well-developed HEENT:  Head: Normocephalic Eyes:EOM are normal Neck: Normal range of motion Cardiovascular: Normal rate Pulmonary/chest: Effort normal Neurologic: Patient is alert Skin: Skin is warm Psychiatric: Patient has normal mood and affect    Ortho Exam: Ortho exam demonstrates no weakness with ankle dorsiflexion plantarflexion quad and hamstring strength testing.  She does have good range of motion bilateral wrist shoulders and elbows.  Not much in way of coarse grinding or crepitus on the right shoulder on the left shoulder she has a little coarseness but good rotator cuff strength.  The shoulders in and of themselves do not feel like they have the motion restriction that I would expect with any type of glenohumeral arthritis.  She does have predictably limited motion of the neck which is painful for her.  She does have paresthesias in the C7 distribution left versus right.  No muscle atrophy in the arms  or legs.  Specialty Comments:  No specialty comments available.  Imaging: No results found.   PMFS History: Patient Active Problem List   Diagnosis Date Noted  . Nausea 04/27/2018  . Anemia, iron deficiency 04/27/2018  . Intermittent paresthesia of hand and foot 04/27/2018  . Headache 03/21/2018  . Lumbar radiculitis 08/15/2017  . Degenerative disc disease, lumbar 07/21/2017  . Encounter for well adult exam with abnormal findings 06/30/2017  . Epigastric pain 06/30/2017  . Restrictive lung disease 06/30/2017  . Leg cramping 06/09/2017  . Elbow contusion 06/09/2017  . Right elbow pain 05/27/2017  . Asthma-COPD overlap syndrome ([place]) 01/31/2017  .  Dizziness 01/20/2017  . Seasonal and perennial allergic rhinitis 01/01/2017  . Moderate persistent asthma, uncomplicated 42/70/6237  . Pulmonary emphysema ([place]) 12/03/2016  . Tobacco use disorder 12/03/2016  . Chronic seasonal allergic rhinitis 12/03/2016  . Medication management 11/29/2016  . Cough 11/23/2016  . Rash 11/23/2016  . Vertigo 10/22/2016  . Gastroesophageal reflux disease with esophagitis 10/22/2016  . Non compliance w medication regimen 08/01/2016  . Allergic contact dermatitis due to metals 08/01/2016  . Migraine without aura and without status migrainosus, not intractable 07/22/2016  . Spasm of muscle of lower back 06/21/2016  . Chronic rhinitis 06/19/2016  . Gait difficulty 06/21/2015  . Fibromyalgia 06/21/2015  . Conversion reaction 06/21/2015  . Depression 06/21/2015  . Syncope and collapse 06/21/2015  . Plica syndrome of left knee 01/06/2015  . Anxiety state 09/06/2014  . History of MI 09/06/2014  . Cigarette smoker 06/27/2013  . Borderline hypertension 06/27/2013  . Chronic low back pain with right-sided sciatica 06/14/2013  . Leg pain, bilateral 06/14/2013  . Gastroparesis 10/26/2009  . Musculoskeletal chest pain 05/02/2009  . Hyperlipidemia LDL goal <100 11/18/2008  . COPD exacerbation ([place]) 11/18/2008  . HEPATIC CYST 11/18/2008   Past Medical History:  Diagnosis Date  . Acute MI ([facility])    x3 - by report only. She has had 3 negative Myoview stress test with no evidence of prior infarct.  . Allergic rhinitis   . Allergy   . Anemia   . Ankle fracture 05/2016  . Anxiety   . Arthritis   . Asthma   . Barrett esophagus 2007  . Chest pain 05-02-2009   echo  EF 55%  . COPD (chronic obstructive pulmonary disease) with chronic bronchitis (HCC)    & Emphysema  . Depression   . Duodenitis without mention of hemorrhage 2007  . Esophageal reflux 2007  . Esophageal stricture   . Full dentures   . H/O hiatal hernia   . Hiatal hernia 6283,[id number]  .  Hyperlipidemia   . Multiple fractures    from falls, fx rt. elbow, fx left wrist, bilateral ankles  . Pneumonia 10/2016  . Restrictive lung disease 06/30/2017  . Ulcer     Family History  Problem Relation Age of Onset  . Emphysema Father   . Dementia Mother   . Diabetes Maternal Grandmother   . Diabetes Maternal Aunt   . Diabetes Unknown        mat. cousin  . Heart disease Brother   . Colon cancer Neg Hx   . Allergic rhinitis Neg Hx   . Angioedema Neg Hx   . Asthma Neg Hx   . Atopy Neg Hx   . Eczema Neg Hx   . Immunodeficiency Neg Hx   . Urticaria Neg Hx     Past Surgical History:  Procedure Laterality Date  . ABDOMINAL HYSTERECTOMY  BSO  . APPENDECTOMY    . CESAREAN SECTION     x 2  . CHOLECYSTECTOMY    . CHONDROPLASTY Left 01/06/2015   Procedure: CHONDROPLASTY;  Surgeon: Marybelle Killings, MD;  Location: [facility];  Service: Orthopedics;  Laterality: Left;  . COLONOSCOPY    . ELBOW FRACTURE SURGERY     right  . KNEE ARTHROSCOPY WITH EXCISION PLICA Left 2/57/5051   Procedure: KNEE ARTHROSCOPY WITH EXCISION PLICA;  Surgeon: Marybelle Killings, MD;  Location: [facility];  Service: Orthopedics;  Laterality: Left;  . Lower Extremity Arterial Dopplers  07/06/2013   RABI 1.0, LABI 1.1.; No evidence of significant vascular atherogenic plaque  . NECK SURGERY    . NM MYOVIEW LTD  09/2014   LOW RISK. Normal EF of 65% with no regional wall motion abnormalities. No ischemia or infarction.  . TONSILLECTOMY    . WRIST FRACTURE SURGERY     left, has plate   Social History   Occupational History  . Occupation: disable  Tobacco Use  . Smoking status: Former Smoker    Packs/day: 1.00    Years: 39.00    Pack years: 39.00    Types: Cigarettes    Start date: 08/06/1977  . Smokeless tobacco: Never Used  . Tobacco comment: stopped 10/22.   Substance and Sexual Activity  . Alcohol use: No    Alcohol/week: 0.0 standard drinks  . Drug use: No  . Sexual  activity: Never

## 2018-08-27 ENCOUNTER — Ambulatory Visit (INDEPENDENT_AMBULATORY_CARE_PROVIDER_SITE_OTHER): Payer: Medicare Other

## 2018-08-27 DIAGNOSIS — G43719 Chronic migraine without aura, intractable, without status migrainosus: Secondary | ICD-10-CM | POA: Diagnosis not present

## 2018-08-27 DIAGNOSIS — E538 Deficiency of other specified B group vitamins: Secondary | ICD-10-CM

## 2018-08-27 DIAGNOSIS — M542 Cervicalgia: Secondary | ICD-10-CM | POA: Diagnosis not present

## 2018-08-27 DIAGNOSIS — M791 Myalgia, unspecified site: Secondary | ICD-10-CM | POA: Diagnosis not present

## 2018-08-27 MED ORDER — CYANOCOBALAMIN 1000 MCG/ML IJ SOLN
1000.0000 ug | Freq: Once | INTRAMUSCULAR | Status: AC
  Administered 2018-08-27: 1000 ug via INTRAMUSCULAR

## 2018-08-27 NOTE — Progress Notes (Signed)
Medical screening examination/treatment/procedure(s) were performed by non-physician practitioner and as supervising physician I was immediately available for consultation/collaboration. I agree with above. James John, MD   

## 2018-08-28 ENCOUNTER — Ambulatory Visit (HOSPITAL_COMMUNITY): Payer: Medicare Other

## 2018-08-28 ENCOUNTER — Other Ambulatory Visit (HOSPITAL_COMMUNITY): Payer: Medicare Other

## 2018-08-31 ENCOUNTER — Telehealth (INDEPENDENT_AMBULATORY_CARE_PROVIDER_SITE_OTHER): Payer: Self-pay | Admitting: Orthopedic Surgery

## 2018-08-31 NOTE — Telephone Encounter (Signed)
Called patient left message to return call to schedule an appointment for MRI review with Dr. Dean    

## 2018-09-01 ENCOUNTER — Ambulatory Visit (HOSPITAL_COMMUNITY)
Admission: RE | Admit: 2018-09-01 | Discharge: 2018-09-01 | Disposition: A | Discharge status: Home / Self Care | Payer: Medicare Other | Source: Ambulatory Visit | Attending: Orthopedic Surgery | Admitting: Orthopedic Surgery

## 2018-09-01 ENCOUNTER — Ambulatory Visit (INDEPENDENT_AMBULATORY_CARE_PROVIDER_SITE_OTHER): Payer: Medicare Other | Admitting: *Deleted

## 2018-09-01 DIAGNOSIS — M4322 Fusion of spine, cervical region: Secondary | ICD-10-CM | POA: Insufficient documentation

## 2018-09-01 DIAGNOSIS — M545 Low back pain, unspecified: Secondary | ICD-10-CM

## 2018-09-01 DIAGNOSIS — H52223 Regular astigmatism, bilateral: Secondary | ICD-10-CM | POA: Diagnosis not present

## 2018-09-01 DIAGNOSIS — M47816 Spondylosis without myelopathy or radiculopathy, lumbar region: Secondary | ICD-10-CM | POA: Diagnosis not present

## 2018-09-01 DIAGNOSIS — G8929 Other chronic pain: Secondary | ICD-10-CM | POA: Insufficient documentation

## 2018-09-01 DIAGNOSIS — M542 Cervicalgia: Secondary | ICD-10-CM | POA: Diagnosis not present

## 2018-09-01 DIAGNOSIS — M4313 Spondylolisthesis, cervicothoracic region: Secondary | ICD-10-CM | POA: Insufficient documentation

## 2018-09-01 DIAGNOSIS — J309 Allergic rhinitis, unspecified: Secondary | ICD-10-CM

## 2018-09-01 DIAGNOSIS — H524 Presbyopia: Secondary | ICD-10-CM | POA: Diagnosis not present

## 2018-09-01 DIAGNOSIS — M5136 Other intervertebral disc degeneration, lumbar region: Secondary | ICD-10-CM | POA: Insufficient documentation

## 2018-09-01 DIAGNOSIS — M5126 Other intervertebral disc displacement, lumbar region: Secondary | ICD-10-CM | POA: Diagnosis not present

## 2018-09-03 NOTE — Progress Notes (Signed)
Vials exp 09-08-19 

## 2018-09-05 ENCOUNTER — Other Ambulatory Visit: Payer: Self-pay | Admitting: Internal Medicine

## 2018-09-07 ENCOUNTER — Other Ambulatory Visit: Payer: Self-pay | Admitting: Internal Medicine

## 2018-09-07 DIAGNOSIS — J3089 Other allergic rhinitis: Secondary | ICD-10-CM | POA: Diagnosis not present

## 2018-09-07 DIAGNOSIS — M79642 Pain in left hand: Secondary | ICD-10-CM | POA: Diagnosis not present

## 2018-09-07 DIAGNOSIS — M79641 Pain in right hand: Secondary | ICD-10-CM | POA: Diagnosis not present

## 2018-09-07 DIAGNOSIS — G43719 Chronic migraine without aura, intractable, without status migrainosus: Secondary | ICD-10-CM | POA: Diagnosis not present

## 2018-09-07 NOTE — Telephone Encounter (Signed)
Copied from [place] [phone]. Topic: Quick Communication - Rx Refill/Question >> Sep 07, 2018 11:54 AM Percell Belt A wrote: Medication: DEXILANT 60 MG capsule [[id number]]   Has the patient contacted their pharmacy?  Yes - pharmacy told her that she did not have any more refills  (Agent: If no, request that the patient contact the pharmacy for the refill.) (Agent: If yes, when and what did the pharmacy advise?)  Preferred Pharmacy (with phone number or street name): [facility] on [address] [employer]: Please be advised that RX refills may take up to 3 business days. We ask that you follow-up with your pharmacy.

## 2018-09-08 DIAGNOSIS — J301 Allergic rhinitis due to pollen: Secondary | ICD-10-CM | POA: Diagnosis not present

## 2018-09-15 ENCOUNTER — Ambulatory Visit (INDEPENDENT_AMBULATORY_CARE_PROVIDER_SITE_OTHER): Payer: Medicare Other | Admitting: *Deleted

## 2018-09-15 DIAGNOSIS — J309 Allergic rhinitis, unspecified: Secondary | ICD-10-CM | POA: Diagnosis not present

## 2018-09-23 ENCOUNTER — Other Ambulatory Visit: Payer: Self-pay | Admitting: Internal Medicine

## 2018-09-23 ENCOUNTER — Ambulatory Visit (INDEPENDENT_AMBULATORY_CARE_PROVIDER_SITE_OTHER): Payer: Medicare Other | Admitting: Orthopedic Surgery

## 2018-09-23 ENCOUNTER — Encounter (INDEPENDENT_AMBULATORY_CARE_PROVIDER_SITE_OTHER): Payer: Self-pay | Admitting: Orthopedic Surgery

## 2018-09-23 DIAGNOSIS — G8929 Other chronic pain: Secondary | ICD-10-CM | POA: Diagnosis not present

## 2018-09-23 DIAGNOSIS — M545 Low back pain: Secondary | ICD-10-CM | POA: Diagnosis not present

## 2018-09-23 NOTE — Progress Notes (Signed)
Office Visit Note   Patient: Lori [name]           Date of Birth: 07-14-1956           MRN: [id number] Visit Date: 09/23/2018 Requested by: Biagio Borg, MD [place], [place] [address] PCP: Biagio Borg, MD  Subjective: Chief Complaint  Patient presents with  . Spine - Follow-up  . Neck - Follow-up    HPI: Lori [name] is a patient with neck and back pain.  Since of seen her she is had MRI scans of both.  MRI of the lumbar spine shows no change in mild degenerative disease of the lumbar spine at L2-3 and L4-5.  Central canal and foramina are widely patent at all levels.  On her neck she has MRI scan which shows fusion from P3-X9 with no complicating features.  She has a little bit of anterolisthesis at C7-T1.              ROS: All systems reviewed are negative as they relate to the chief complaint within the history of present illness.  Patient denies  fevers or chills.   Assessment & Plan: Visit Diagnoses:  1. Chronic bilateral low back pain without sciatica     Plan: All systems reviewed are negative as they relate to the chief complaint within the history of present illness.  Patient denies  fevers or chills. Impression is low back pain  Follow-Up Instructions: No follow-ups on file.   Orders:  Orders Placed This Encounter  Procedures  . Ambulatory referral to Physical Medicine Rehab   No orders of the defined types were placed in this encounter.     Procedures: No procedures performed   Clinical Data: No additional findings.  Objective: Vital Signs: There were no vitals taken for this visit.  Physical Exam:   Constitutional: Patient appears well-developed HEENT:  Head: Normocephalic Eyes:EOM are normal Neck: Normal range of motion Cardiovascular: Normal rate Pulmonary/chest: Effort normal Neurologic: Patient is alert Skin: Skin is warm Psychiatric: Patient has normal mood and affect    Ortho Exam: Ortho exam demonstrates good  cervical spine range of motion unchanged from prior visit.  Again she is predictably stiff from her fusion but that is not her main area of complaint.  She is having a lot of pain in that right SI joint region.  No nerve root tension signs.  Pretty normal gait.  Otherwise exam is unchanged  Specialty Comments:  No specialty comments available.  Imaging: No results found.   PMFS History: Patient Active Problem List   Diagnosis Date Noted  . Nausea 04/27/2018  . Anemia, iron deficiency 04/27/2018  . Intermittent paresthesia of hand and foot 04/27/2018  . Headache 03/21/2018  . Lumbar radiculitis 08/15/2017  . Degenerative disc disease, lumbar 07/21/2017  . Encounter for well adult exam with abnormal findings 06/30/2017  . Epigastric pain 06/30/2017  . Restrictive lung disease 06/30/2017  . Leg cramping 06/09/2017  . Elbow contusion 06/09/2017  . Right elbow pain 05/27/2017  . Asthma-COPD overlap syndrome ([place]) 01/31/2017  . Dizziness 01/20/2017  . Seasonal and perennial allergic rhinitis 01/01/2017  . Moderate persistent asthma, uncomplicated 02/40/9735  . Pulmonary emphysema ([place]) 12/03/2016  . Tobacco use disorder 12/03/2016  . Chronic seasonal allergic rhinitis 12/03/2016  . Medication management 11/29/2016  . Cough 11/23/2016  . Rash 11/23/2016  . Vertigo 10/22/2016  . Gastroesophageal reflux disease with esophagitis 10/22/2016  . Non compliance w medication regimen  08/01/2016  . Allergic contact dermatitis due to metals 08/01/2016  . Migraine without aura and without status migrainosus, not intractable 07/22/2016  . Spasm of muscle of lower back 06/21/2016  . Chronic rhinitis 06/19/2016  . Gait difficulty 06/21/2015  . Fibromyalgia 06/21/2015  . Conversion reaction 06/21/2015  . Depression 06/21/2015  . Syncope and collapse 06/21/2015  . Plica syndrome of left knee 01/06/2015  . Anxiety state 09/06/2014  . History of MI 09/06/2014  . Cigarette smoker 06/27/2013  .  Borderline hypertension 06/27/2013  . Chronic low back pain with right-sided sciatica 06/14/2013  . Leg pain, bilateral 06/14/2013  . Gastroparesis 10/26/2009  . Musculoskeletal chest pain 05/02/2009  . Hyperlipidemia LDL goal <100 11/18/2008  . COPD exacerbation ([facility]) 11/18/2008  . HEPATIC CYST 11/18/2008   Past Medical History:  Diagnosis Date  . Acute MI ([facility])    x3 - by report only. She has had 3 negative Myoview stress test with no evidence of prior infarct.  . Allergic rhinitis   . Allergy   . Anemia   . Ankle fracture 05/2016  . Anxiety   . Arthritis   . Asthma   . Barrett esophagus 2007  . Chest pain 05-02-2009   echo  EF 55%  . COPD (chronic obstructive pulmonary disease) with chronic bronchitis (HCC)    & Emphysema  . Depression   . Duodenitis without mention of hemorrhage 2007  . Esophageal reflux 2007  . Esophageal stricture   . Full dentures   . H/O hiatal hernia   . Hiatal hernia [id number]  . Hyperlipidemia   . Multiple fractures    from falls, fx rt. elbow, fx left wrist, bilateral ankles  . Pneumonia 10/2016  . Restrictive lung disease 06/30/2017  . Ulcer     Family History  Problem Relation Age of Onset  . Emphysema Father   . Dementia Mother   . Diabetes Maternal Grandmother   . Diabetes Maternal Aunt   . Diabetes Unknown        mat. cousin  . Heart disease Brother   . Colon cancer Neg Hx   . Allergic rhinitis Neg Hx   . Angioedema Neg Hx   . Asthma Neg Hx   . Atopy Neg Hx   . Eczema Neg Hx   . Immunodeficiency Neg Hx   . Urticaria Neg Hx     Past Surgical History:  Procedure Laterality Date  . ABDOMINAL HYSTERECTOMY     BSO  . APPENDECTOMY    . CESAREAN SECTION     x 2  . CHOLECYSTECTOMY    . CHONDROPLASTY Left 01/06/2015   Procedure: CHONDROPLASTY;  Surgeon: Marybelle Killings, MD;  Location: [facility];  Service: Orthopedics;  Laterality: Left;  . COLONOSCOPY    . ELBOW FRACTURE SURGERY     right  . KNEE ARTHROSCOPY  WITH EXCISION PLICA Left 7/67/2094   Procedure: KNEE ARTHROSCOPY WITH EXCISION PLICA;  Surgeon: Marybelle Killings, MD;  Location: [facility];  Service: Orthopedics;  Laterality: Left;  . Lower Extremity Arterial Dopplers  07/06/2013   RABI 1.0, LABI 1.1.; No evidence of significant vascular atherogenic plaque  . NECK SURGERY    . NM MYOVIEW LTD  09/2014   LOW RISK. Normal EF of 65% with no regional wall motion abnormalities. No ischemia or infarction.  . TONSILLECTOMY    . WRIST FRACTURE SURGERY     left, has plate   Social History   Occupational History  .  Occupation: disable  Tobacco Use  . Smoking status: Former Smoker    Packs/day: 1.00    Years: 39.00    Pack years: 39.00    Types: Cigarettes    Start date: 08/06/1977  . Smokeless tobacco: Never Used  . Tobacco comment: stopped 10/22.   Substance and Sexual Activity  . Alcohol use: No    Alcohol/week: 0.0 standard drinks  . Drug use: No  . Sexual activity: Never

## 2018-10-02 ENCOUNTER — Ambulatory Visit (INDEPENDENT_AMBULATORY_CARE_PROVIDER_SITE_OTHER): Payer: Medicare Other

## 2018-10-02 ENCOUNTER — Ambulatory Visit (INDEPENDENT_AMBULATORY_CARE_PROVIDER_SITE_OTHER): Payer: Medicare Other | Admitting: *Deleted

## 2018-10-02 DIAGNOSIS — E538 Deficiency of other specified B group vitamins: Secondary | ICD-10-CM

## 2018-10-02 DIAGNOSIS — J309 Allergic rhinitis, unspecified: Secondary | ICD-10-CM | POA: Diagnosis not present

## 2018-10-02 MED ORDER — CYANOCOBALAMIN 1000 MCG/ML IJ SOLN
1000.0000 ug | Freq: Once | INTRAMUSCULAR | Status: AC
  Administered 2018-10-02: 1000 ug via INTRAMUSCULAR

## 2018-10-02 NOTE — Progress Notes (Signed)
Medical screening examination/treatment/procedure(s) were performed by non-physician practitioner and as supervising physician I was immediately available for consultation/collaboration. I agree with above. Janeah Kovacich, MD   

## 2018-10-05 ENCOUNTER — Ambulatory Visit: Payer: Medicare Other | Admitting: Nurse Practitioner

## 2018-10-06 ENCOUNTER — Ambulatory Visit: Payer: Self-pay

## 2018-10-07 ENCOUNTER — Ambulatory Visit (INDEPENDENT_AMBULATORY_CARE_PROVIDER_SITE_OTHER): Payer: Medicare Other

## 2018-10-07 DIAGNOSIS — J4550 Severe persistent asthma, uncomplicated: Secondary | ICD-10-CM

## 2018-10-08 ENCOUNTER — Other Ambulatory Visit (HOSPITAL_COMMUNITY): Payer: Self-pay | Admitting: Specialist

## 2018-10-08 DIAGNOSIS — M791 Myalgia, unspecified site: Secondary | ICD-10-CM | POA: Diagnosis not present

## 2018-10-08 DIAGNOSIS — G43719 Chronic migraine without aura, intractable, without status migrainosus: Secondary | ICD-10-CM | POA: Diagnosis not present

## 2018-10-08 DIAGNOSIS — M542 Cervicalgia: Secondary | ICD-10-CM | POA: Diagnosis not present

## 2018-10-09 ENCOUNTER — Other Ambulatory Visit: Payer: Self-pay | Admitting: Specialist

## 2018-10-09 DIAGNOSIS — M79642 Pain in left hand: Secondary | ICD-10-CM | POA: Diagnosis not present

## 2018-10-09 DIAGNOSIS — R519 Headache, unspecified: Secondary | ICD-10-CM

## 2018-10-09 DIAGNOSIS — R51 Headache: Principal | ICD-10-CM

## 2018-10-09 DIAGNOSIS — M79641 Pain in right hand: Secondary | ICD-10-CM | POA: Diagnosis not present

## 2018-10-12 ENCOUNTER — Ambulatory Visit (INDEPENDENT_AMBULATORY_CARE_PROVIDER_SITE_OTHER): Payer: Self-pay

## 2018-10-12 ENCOUNTER — Encounter (INDEPENDENT_AMBULATORY_CARE_PROVIDER_SITE_OTHER): Payer: Self-pay | Admitting: Physical Medicine and Rehabilitation

## 2018-10-12 ENCOUNTER — Ambulatory Visit (INDEPENDENT_AMBULATORY_CARE_PROVIDER_SITE_OTHER): Payer: Medicare Other | Admitting: Physical Medicine and Rehabilitation

## 2018-10-12 VITALS — BP 131/76 | HR 92 | Temp 98.6°F

## 2018-10-12 DIAGNOSIS — M47816 Spondylosis without myelopathy or radiculopathy, lumbar region: Secondary | ICD-10-CM | POA: Diagnosis not present

## 2018-10-12 DIAGNOSIS — G8929 Other chronic pain: Secondary | ICD-10-CM | POA: Diagnosis not present

## 2018-10-12 DIAGNOSIS — M545 Low back pain: Secondary | ICD-10-CM | POA: Diagnosis not present

## 2018-10-12 MED ORDER — BUPIVACAINE HCL 0.5 % IJ SOLN
3.0000 mL | Freq: Once | INTRAMUSCULAR | Status: AC
  Administered 2018-10-12: 3 mL

## 2018-10-12 NOTE — Progress Notes (Signed)
.  Numeric Pain Rating Scale and Functional Assessment Average Pain 10   In the last MONTH (on 0-10 scale) has pain interfered with the following?  1. General activity like being  able to carry out your everyday physical activities such as walking, climbing stairs, carrying groceries, or moving a chair?  Rating(9)   +Driver, -BT, -Dye Allergies.  

## 2018-10-12 NOTE — Patient Instructions (Signed)
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
## 2018-10-12 NOTE — Procedures (Signed)
Lumbar Diagnostic Facet Joint Nerve Block with Fluoroscopic Guidance   Patient: Lori [name]      Date of Birth: [birth year] MRN: [id number] PCP: Biagio Borg, MD      Visit Date: 10/12/2018   Universal Protocol:    Date/Time: 01/27/209:15 AM  Consent Given By: the patient  Position: PRONE  Additional Comments: Vital signs were monitored before and after the procedure. Patient was prepped and draped in the usual sterile fashion. The correct patient, procedure, and site was verified.   Injection Procedure Details:  Procedure Site One Meds Administered:  Meds ordered this encounter  Medications  . bupivacaine (MARCAINE) 0.5 % (with pres) injection 3 mL     Laterality: Bilateral  Location/Site:  L4-L5  Needle size: 22 ga.  Needle type:spinal  Needle Placement: Oblique pedical  Findings:   -Comments: There was excellent flow of contrast along the articular pillars without intravascular flow.  Procedure Details: The fluoroscope beam is vertically oriented in AP and then obliqued 15 to 20 degrees to the ipsilateral side of the desired nerve to achieve the "Scotty dog" appearance.  The skin over the target area of the junction of the superior articulating process and the transverse process (sacral ala if blocking the L5 dorsal rami) was locally anesthetized with a 1 ml volume of 1% Lidocaine without Epinephrine.  The spinal needle was inserted and advanced in a trajectory view down to the target.   After contact with periosteum and negative aspirate for blood and CSF, correct placement without intravascular or epidural spread was confirmed by injecting 0.5 ml. of Isovue-250.  A spot radiograph was obtained of this image.    Next, a 0.5 ml. volume of the injectate described above was injected. The needle was then redirected to the other facet joint nerves mentioned above if needed.  Prior to the procedure, the patient was given a Pain Diary which was completed for  baseline measurements.  After the procedure, the patient rated their pain every 30 minutes and will continue rating at this frequency for a total of 5 hours.  The patient has been asked to complete the Diary and return to [place] by mail, fax or hand delivered as soon as possible.   Additional Comments:  The patient tolerated the procedure well Dressing: Band-Aid    Post-procedure details: Patient was observed during the procedure. Post-procedure instructions were reviewed.  Patient left the clinic in stable condition.

## 2018-10-16 ENCOUNTER — Other Ambulatory Visit: Payer: Self-pay | Admitting: Internal Medicine

## 2018-10-16 ENCOUNTER — Ambulatory Visit (INDEPENDENT_AMBULATORY_CARE_PROVIDER_SITE_OTHER): Payer: Medicare Other | Admitting: *Deleted

## 2018-10-16 DIAGNOSIS — J309 Allergic rhinitis, unspecified: Secondary | ICD-10-CM

## 2018-10-16 NOTE — Telephone Encounter (Signed)
Done erx 

## 2018-10-20 ENCOUNTER — Ambulatory Visit (INDEPENDENT_AMBULATORY_CARE_PROVIDER_SITE_OTHER): Payer: Medicare Other | Admitting: Internal Medicine

## 2018-10-20 ENCOUNTER — Encounter: Payer: Self-pay | Admitting: Internal Medicine

## 2018-10-20 ENCOUNTER — Ambulatory Visit (INDEPENDENT_AMBULATORY_CARE_PROVIDER_SITE_OTHER)
Admission: RE | Admit: 2018-10-20 | Discharge: 2018-10-20 | Disposition: A | Discharge status: Home / Self Care | Payer: Medicare Other | Source: Ambulatory Visit | Attending: Internal Medicine | Admitting: Internal Medicine

## 2018-10-20 ENCOUNTER — Ambulatory Visit (INDEPENDENT_AMBULATORY_CARE_PROVIDER_SITE_OTHER): Payer: Medicare Other | Admitting: *Deleted

## 2018-10-20 ENCOUNTER — Other Ambulatory Visit (INDEPENDENT_AMBULATORY_CARE_PROVIDER_SITE_OTHER): Payer: Medicare Other

## 2018-10-20 VITALS — BP 122/76 | HR 70 | Temp 97.8°F | Ht 62.6 in | Wt 127.0 lb

## 2018-10-20 DIAGNOSIS — E538 Deficiency of other specified B group vitamins: Secondary | ICD-10-CM

## 2018-10-20 DIAGNOSIS — M5441 Lumbago with sciatica, right side: Secondary | ICD-10-CM

## 2018-10-20 DIAGNOSIS — J309 Allergic rhinitis, unspecified: Secondary | ICD-10-CM | POA: Diagnosis not present

## 2018-10-20 DIAGNOSIS — Z0001 Encounter for general adult medical examination with abnormal findings: Secondary | ICD-10-CM | POA: Diagnosis not present

## 2018-10-20 DIAGNOSIS — E785 Hyperlipidemia, unspecified: Secondary | ICD-10-CM

## 2018-10-20 DIAGNOSIS — G8929 Other chronic pain: Secondary | ICD-10-CM

## 2018-10-20 DIAGNOSIS — J441 Chronic obstructive pulmonary disease with (acute) exacerbation: Secondary | ICD-10-CM | POA: Diagnosis not present

## 2018-10-20 DIAGNOSIS — F411 Generalized anxiety disorder: Secondary | ICD-10-CM

## 2018-10-20 DIAGNOSIS — R05 Cough: Secondary | ICD-10-CM | POA: Diagnosis not present

## 2018-10-20 LAB — LIPID PANEL
CHOLESTEROL: 282 mg/dL — AB (ref 0–200)
HDL: 42.5 mg/dL (ref >39.00)
NonHDL: 239.04
Total CHOL/HDL Ratio: 7
Triglycerides: 332 mg/dL — ABNORMAL HIGH (ref 0.0–149.0)
VLDL: 66.4 mg/dL — ABNORMAL HIGH (ref 0.0–40.0)

## 2018-10-20 LAB — CBC WITH DIFFERENTIAL/PLATELET
Basophils Absolute: 0.1 10*3/uL (ref 0.0–0.1)
Basophils Relative: 0.7 % (ref 0.0–3.0)
Eosinophils Absolute: 0 10*3/uL (ref 0.0–0.7)
Eosinophils Relative: 0 % (ref 0.0–5.0)
HCT: 40.2 % (ref 36.0–46.0)
Hemoglobin: 13.4 g/dL (ref 12.0–15.0)
LYMPHS ABS: 4.6 10*3/uL — AB (ref 0.7–4.0)
Lymphocytes Relative: 38.3 % (ref 12.0–46.0)
MCHC: 33.3 g/dL (ref 30.0–36.0)
MCV: 82 fl (ref 78.0–100.0)
Monocytes Absolute: 0.7 10*3/uL (ref 0.1–1.0)
Monocytes Relative: 5.8 % (ref 3.0–12.0)
Neutro Abs: 6.6 10*3/uL (ref 1.4–7.7)
Neutrophils Relative %: 55.2 % (ref 43.0–77.0)
Platelets: 333 10*3/uL (ref 150.0–400.0)
RBC: 4.91 Mil/uL (ref 3.87–5.11)
RDW: 17 % — ABNORMAL HIGH (ref 11.5–15.5)
WBC: 12 10*3/uL — ABNORMAL HIGH (ref 4.0–10.5)

## 2018-10-20 LAB — HEPATIC FUNCTION PANEL
ALT: 12 U/L (ref 0–35)
AST: 8 U/L (ref 0–37)
Albumin: 4.4 g/dL (ref 3.5–5.2)
Alkaline Phosphatase: 95 U/L (ref 39–117)
Bilirubin, Direct: 0.1 mg/dL (ref 0.0–0.3)
TOTAL PROTEIN: 6.5 g/dL (ref 6.0–8.3)
Total Bilirubin: 0.3 mg/dL (ref 0.2–1.2)

## 2018-10-20 LAB — BASIC METABOLIC PANEL
BUN: 11 mg/dL (ref 6–23)
CALCIUM: 10 mg/dL (ref 8.4–10.5)
CO2: 25 mEq/L (ref 19–32)
Chloride: 105 mEq/L (ref 96–112)
Creatinine, Ser: 0.78 mg/dL (ref 0.40–1.20)
GFR: 74.79 mL/min (ref >60.00)
Glucose, Bld: 93 mg/dL (ref 70–99)
Potassium: 4.3 mEq/L (ref 3.5–5.1)
Sodium: 139 mEq/L (ref 135–145)

## 2018-10-20 LAB — VITAMIN B12: VITAMIN B 12: 239 pg/mL (ref 211–911)

## 2018-10-20 LAB — URINALYSIS, ROUTINE W REFLEX MICROSCOPIC
Bilirubin Urine: NEGATIVE
Hgb urine dipstick: NEGATIVE
Ketones, ur: NEGATIVE
Leukocytes, UA: NEGATIVE
Nitrite: NEGATIVE
PH: 6 (ref 5.0–8.0)
RBC / HPF: NONE SEEN (ref 0–?)
Specific Gravity, Urine: 1.02 (ref 1.000–1.030)
Total Protein, Urine: NEGATIVE
Urine Glucose: NEGATIVE
Urobilinogen, UA: 0.2 (ref 0.0–1.0)

## 2018-10-20 LAB — LDL CHOLESTEROL, DIRECT: Direct LDL: 201 mg/dL

## 2018-10-20 LAB — TSH: TSH: 2.41 u[IU]/mL (ref 0.35–4.50)

## 2018-10-20 MED ORDER — METHYLPREDNISOLONE ACETATE 80 MG/ML IJ SUSP
80.0000 mg | Freq: Once | INTRAMUSCULAR | Status: AC
  Administered 2018-10-20: 80 mg via INTRAMUSCULAR

## 2018-10-20 MED ORDER — ALPRAZOLAM 1 MG PO TABS: ORAL_TABLET | ORAL | 5 refills | Status: DC

## 2018-10-20 MED ORDER — AZITHROMYCIN 250 MG PO TABS: ORAL_TABLET | ORAL | 1 refills | Status: DC

## 2018-10-20 MED ORDER — PROMETHAZINE-DM 6.25-15 MG/5ML PO SYRP: 5.0000 mL | ORAL_SOLUTION | Freq: Four times a day (QID) | ORAL | 1 refills | Status: DC | PRN

## 2018-10-20 MED ORDER — ROSUVASTATIN CALCIUM 20 MG PO TABS: 20.0000 mg | ORAL_TABLET | Freq: Every day | ORAL | 3 refills | Status: DC

## 2018-10-20 MED ORDER — GABAPENTIN 100 MG PO CAPS: 100.0000 mg | ORAL_CAPSULE | Freq: Three times a day (TID) | ORAL | 1 refills | Status: DC

## 2018-10-20 MED ORDER — CITALOPRAM HYDROBROMIDE 20 MG PO TABS: 20.0000 mg | ORAL_TABLET | Freq: Every day | ORAL | 3 refills | Status: DC

## 2018-10-20 MED ORDER — PREDNISONE 10 MG PO TABS: 10.0000 mg | ORAL_TABLET | Freq: Every day | ORAL | 0 refills | Status: AC

## 2018-10-20 MED ORDER — MONTELUKAST SODIUM 10 MG PO TABS: 10.0000 mg | ORAL_TABLET | Freq: Every day | ORAL | 3 refills | Status: DC

## 2018-10-20 MED ORDER — VALACYCLOVIR HCL 500 MG PO TABS: ORAL_TABLET | ORAL | 5 refills | Status: DC

## 2018-10-20 NOTE — Assessment & Plan Note (Signed)
Ok to add celexa 20 qd

## 2018-10-20 NOTE — Assessment & Plan Note (Signed)
Uncontrolled, now willing to start crestor 20 qd

## 2018-10-20 NOTE — Assessment & Plan Note (Addendum)
Mild to mod, for depomedrol IM 80, predpac asd, antibiotic, predpac asd, to f/u any worsening symptoms or concerns  In addition to the time spent performing CPE, I spent an additional 25 minutes face to face,in which greater than 50% of this time was spent in counseling and coordination of care for patient's acute illness as documented, including the differential dx, treatment, further evaluation and other management of copd exacerbation, chronic LBP, b12 deficiency, HLD, anxiety

## 2018-10-20 NOTE — Assessment & Plan Note (Signed)
Chronic persistent, to try gabapentin 100 tid trial

## 2018-10-20 NOTE — Assessment & Plan Note (Signed)
To continue monthly injection, f/u lab

## 2018-10-20 NOTE — Progress Notes (Signed)
Subjective:    Patient ID: Lori [name], female    DOB: [birth year], 63 y.o.   MRN: [id number]  HPI  Here for wellness and f/u;  Overall doing ok;    Pt denies neurological change such as new headache, facial or extremity weakness.  Pt denies polydipsia, polyuria, or low sugar symptoms. Pt states overall good compliance with treatment and medications, good tolerability, and has been trying to follow appropriate diet.  Pt denies worsening depressive symptoms, suicidal ideation though has had some panic recently over mother in the NH situation, does not feel she is cared for very well.  . No fever, night sweats, wt loss, loss of appetite, or other constitutional symptoms.  Pt states good ability with ADL's, has low fall risk, home safety reviewed and adequate, no other significant changes in hearing or vision, and not active with exercise recnently as she is S/p ESI x 2 to lower back not really helped so far.  Pt continues to have recurring LBP without change in severity, bowel or bladder change, fever, wt loss,  worsening LE pain/numbness/weakness, gait change or falls. Here also with acute onset mild to mod 2-3 days ST, HA, general weakness and malaise, with prod cough greenish sputum, but Pt denies chest pain, increased sob or doe, wheezing, orthopnea, PND, increased LE swelling, palpitations, dizziness or syncope, except for onset sob and wheezing last night.  Has hx of walking pna per pt. Has been getting her monthly B12 shots.  Past Medical History:  Diagnosis Date  . Acute MI ([place])    x3 - by report only. She has had 3 negative Myoview stress test with no evidence of prior infarct.  . Allergic rhinitis   . Allergy   . Anemia   . Ankle fracture 05/2016  . Anxiety   . Arthritis   . Asthma   . Barrett esophagus 2007  . Chest pain 05-02-2009   echo  EF 55%  . COPD (chronic obstructive pulmonary disease) with chronic bronchitis (HCC)    & Emphysema  . Depression   . Duodenitis without  mention of hemorrhage 2007  . Esophageal reflux 2007  . Esophageal stricture   . Full dentures   . H/O hiatal hernia   . Hiatal hernia 2025,4270  . Hyperlipidemia   . Multiple fractures    from falls, fx rt. elbow, fx left wrist, bilateral ankles  . Pneumonia 10/2016  . Restrictive lung disease 06/30/2017  . Ulcer    Past Surgical History:  Procedure Laterality Date  . ABDOMINAL HYSTERECTOMY     BSO  . APPENDECTOMY    . CESAREAN SECTION     x 2  . CHOLECYSTECTOMY    . CHONDROPLASTY Left 01/06/2015   Procedure: CHONDROPLASTY;  Surgeon: Marybelle Killings, MD;  Location: [facility];  Service: Orthopedics;  Laterality: Left;  . COLONOSCOPY    . ELBOW FRACTURE SURGERY     right  . KNEE ARTHROSCOPY WITH EXCISION PLICA Left 03/09/7627   Procedure: KNEE ARTHROSCOPY WITH EXCISION PLICA;  Surgeon: Marybelle Killings, MD;  Location: [facility];  Service: Orthopedics;  Laterality: Left;  . Lower Extremity Arterial Dopplers  07/06/2013   RABI 1.0, LABI 1.1.; No evidence of significant vascular atherogenic plaque  . NECK SURGERY    . NM MYOVIEW LTD  09/2014   LOW RISK. Normal EF of 65% with no regional wall motion abnormalities. No ischemia or infarction.  . TONSILLECTOMY    . WRIST  FRACTURE SURGERY     left, has plate    reports that she has quit smoking. Her smoking use included cigarettes. She started smoking about 41 years ago. She has a 39.00 pack-year smoking history. She has never used smokeless tobacco. She reports that she does not drink alcohol or use drugs. family history includes Dementia in her mother; Diabetes in her maternal aunt, maternal grandmother, and unknown relative; Emphysema in her father; Heart disease in her brother. Allergies  Allergen Reactions  . Bee Venom Swelling  . Codeine Nausea Only and Other (See Comments)    CAUSES ULCERS  . Ibuprofen Nausea And Vomiting  . Tramadol Nausea Only  . Hydrocodone-Acetaminophen Nausea Only  . Latex  Rash  . Levofloxacin Nausea Only  . Propoxyphene N-Acetaminophen Nausea Only   Current Outpatient Medications on File Prior to Visit  Medication Sig Dispense Refill  . cyclobenzaprine (FLEXERIL) 5 MG tablet TAKE 1 TABLET(5 MG) BY MOUTH THREE TIMES DAILY AS NEEDED FOR MUSCLE SPASMS 60 tablet 1  . DEXILANT 60 MG capsule TAKE 1 CAPSULE(60 MG) BY MOUTH DAILY 30 capsule 5  . EPINEPHrine 0.3 mg/0.3 mL IJ SOAJ injection INJECT AS DIRECTED FOR SEVERE ALLERGIC REACTION 2 Device 2  . FASENRA 30 MG/ML SOSY     . nitroGLYCERIN (NITROSTAT) 0.4 MG SL tablet PLACE 1 TABLET UNDER THE TONGUE EVERY 5 MINUTES AS NEEDED FOR CHEST PAIN. 25 tablet 1  . PROAIR HFA 108 (90 Base) MCG/ACT inhaler Inhale 1-2 puffs into the lungs every 4 (four) hours as needed for shortness of breath or wheezing. 1 Inhaler 1   Current Facility-Administered Medications on File Prior to Visit  Medication Dose Route Frequency Provider Last Rate Last Dose  . Benralizumab SOSY 30 mg  30 mg Subcutaneous Q8 Toney Reil, MD   30 mg at 10/07/18 2671   Review of Systems Constitutional: Negative for other unusual diaphoresis, sweats, appetite or weight changes HENT: Negative for other worsening hearing loss, ear pain, facial swelling, mouth sores or neck stiffness.   Eyes: Negative for other worsening pain, redness or other visual disturbance.  Respiratory: Negative for other stridor or swelling Cardiovascular: Negative for other palpitations or other chest pain  Gastrointestinal: Negative for worsening diarrhea or loose stools, blood in stool, distention or other pain Genitourinary: Negative for hematuria, flank pain or other change in urine volume.  Musculoskeletal: Negative for myalgias or other joint swelling.  Skin: Negative for other color change, or other wound or worsening drainage.  Neurological: Negative for other syncope or numbness. Hematological: Negative for other adenopathy or swelling Psychiatric/Behavioral:  Negative for hallucinations, other worsening agitation, SI, self-injury, or new decreased concentration All other system neg per pt    Objective:   Physical Exam BP 122/76   Pulse 70   Temp 97.8 F (36.6 C) (Oral)   Ht 5' 2.6" (1.59 m)   Wt 127 lb (57.6 kg)   SpO2 97%   BMI 22.79 kg/m  VS noted, mild ill Constitutional: Pt is oriented to person, place, and time. Appears well-developed and well-nourished, in no significant distress and comfortable Head: Normocephalic and atraumatic  Eyes: Conjunctivae and EOM are normal. Pupils are equal, round, and reactive to light Right Ear: External ear normal without discharge Left Ear: External ear normal without discharge Nose: Nose without discharge or deformity Bilat tm's with mild erythema.  Max sinus areas mild tender.  Pharynx with mild erythema, no exudate Mouth/Throat: Oropharynx is without other ulcerations and moist  Neck: Normal  range of motion. Neck supple. No JVD present. No tracheal deviation present or significant neck LA or mass Cardiovascular: Normal rate, regular rhythm, normal heart sounds and intact distal pulses.   Pulmonary/Chest: WOB normal and breath sounds without rales or wheezing  Abdominal: Soft. Bowel sounds are normal. NT. No HSM  Musculoskeletal: Normal range of motion. Exhibits no edema Lymphadenopathy: Has no other cervical adenopathy.  Neurological: Pt is alert and oriented to person, place, and time. Pt has normal reflexes. No cranial nerve deficit. Motor grossly intact, Gait intact Skin: Skin is warm and dry. No rash noted or new ulcerations Psychiatric:  Has normal mood and affect. Behavior is normal without agitation No other exam findings Lab Results  Component Value Date   WBC 12.0 (H) 10/20/2018   HGB 13.4 10/20/2018   HCT 40.2 10/20/2018   PLT 333.0 10/20/2018   GLUCOSE 93 10/20/2018   CHOL 282 (H) 10/20/2018   TRIG 332.0 (H) 10/20/2018   HDL 42.50 10/20/2018   LDLDIRECT 201.0 10/20/2018    LDLCALC Comment 03/12/2017   ALT 12 10/20/2018   AST 8 10/20/2018   NA 139 10/20/2018   K 4.3 10/20/2018   CL 105 10/20/2018   CREATININE 0.78 10/20/2018   BUN 11 10/20/2018   CO2 25 10/20/2018   TSH 2.41 10/20/2018   INR 0.9 08/31/2008   HGBA1C 5.5 06/21/2016          Assessment & Plan:

## 2018-10-20 NOTE — Assessment & Plan Note (Signed)

## 2018-10-20 NOTE — Patient Instructions (Signed)
You had the steroid shot today  Please take all new medication as prescribed - the antibiotic, cough medicine, and prednisone  Please take all new medication as prescribed  - the crestor for cholesterol, and Celexa for nerves, and gabapentin for low back pain (nerve pain)  Please continue all other medications as before, and refills have been done if requested.  Please have the pharmacy call with any other refills you may need.  Please continue your efforts at being more active, low cholesterol diet, and weight control.  You are otherwise up to date with prevention measures today.  Please keep your appointments with your specialists as you may have planned  Please go to the XRAY Department in the Basement (go straight as you get off the elevator) for the x-ray testing  Please go to the LAB in the Basement (turn left off the elevator) for the tests to be done today  You will be contacted by phone if any changes need to be made immediately.  Otherwise, you will receive a letter about your results with an explanation, but please check with MyChart first.  Please remember to sign up for MyChart if you have not done so, as this will be important to you in the future with finding out test results, communicating by private email, and scheduling acute appointments online when needed.  Please return in 1 year for your yearly visit, or sooner if needed, with Lab testing done 3-5 days before

## 2018-10-21 ENCOUNTER — Telehealth: Payer: Self-pay

## 2018-10-21 ENCOUNTER — Ambulatory Visit
Admission: RE | Admit: 2018-10-21 | Discharge: 2018-10-21 | Disposition: A | Discharge status: Home / Self Care | Payer: Medicare Other | Source: Ambulatory Visit | Attending: Specialist | Admitting: Specialist

## 2018-10-21 DIAGNOSIS — R51 Headache: Secondary | ICD-10-CM | POA: Diagnosis not present

## 2018-10-21 DIAGNOSIS — S0990XA Unspecified injury of head, initial encounter: Secondary | ICD-10-CM | POA: Diagnosis not present

## 2018-10-21 DIAGNOSIS — R519 Headache, unspecified: Secondary | ICD-10-CM

## 2018-10-21 NOTE — Telephone Encounter (Signed)
-----   Message from Biagio Borg, MD sent at 10/20/2018 12:59 PM EST ----- Letter sent; ok to let pt know by phone = xray is negavie

## 2018-10-21 NOTE — Telephone Encounter (Signed)
Pt has been informed of results and expressed understanding.  °

## 2018-10-27 ENCOUNTER — Ambulatory Visit (INDEPENDENT_AMBULATORY_CARE_PROVIDER_SITE_OTHER): Payer: Medicare Other | Admitting: *Deleted

## 2018-10-27 DIAGNOSIS — M546 Pain in thoracic spine: Secondary | ICD-10-CM | POA: Diagnosis not present

## 2018-10-27 DIAGNOSIS — M129 Arthropathy, unspecified: Secondary | ICD-10-CM | POA: Diagnosis not present

## 2018-10-27 DIAGNOSIS — M542 Cervicalgia: Secondary | ICD-10-CM | POA: Diagnosis not present

## 2018-10-27 DIAGNOSIS — M79641 Pain in right hand: Secondary | ICD-10-CM | POA: Diagnosis not present

## 2018-10-27 DIAGNOSIS — Z79899 Other long term (current) drug therapy: Secondary | ICD-10-CM | POA: Diagnosis not present

## 2018-10-27 DIAGNOSIS — M545 Low back pain: Secondary | ICD-10-CM | POA: Diagnosis not present

## 2018-10-27 DIAGNOSIS — J309 Allergic rhinitis, unspecified: Secondary | ICD-10-CM | POA: Diagnosis not present

## 2018-11-02 ENCOUNTER — Ambulatory Visit (INDEPENDENT_AMBULATORY_CARE_PROVIDER_SITE_OTHER): Payer: Medicare Other

## 2018-11-02 DIAGNOSIS — M79642 Pain in left hand: Secondary | ICD-10-CM | POA: Diagnosis not present

## 2018-11-02 DIAGNOSIS — J309 Allergic rhinitis, unspecified: Secondary | ICD-10-CM | POA: Diagnosis not present

## 2018-11-02 DIAGNOSIS — E538 Deficiency of other specified B group vitamins: Secondary | ICD-10-CM | POA: Diagnosis not present

## 2018-11-02 DIAGNOSIS — M542 Cervicalgia: Secondary | ICD-10-CM | POA: Diagnosis not present

## 2018-11-02 DIAGNOSIS — M545 Low back pain: Secondary | ICD-10-CM | POA: Diagnosis not present

## 2018-11-02 DIAGNOSIS — M79641 Pain in right hand: Secondary | ICD-10-CM | POA: Diagnosis not present

## 2018-11-02 MED ORDER — CYANOCOBALAMIN 1000 MCG/ML IJ SOLN
1000.0000 ug | Freq: Once | INTRAMUSCULAR | Status: AC
  Administered 2018-11-02: 1000 ug via INTRAMUSCULAR

## 2018-11-02 NOTE — Progress Notes (Signed)
Medical screening examination/treatment/procedure(s) were performed by non-physician practitioner and as supervising physician I was immediately available for consultation/collaboration. I agree with above. Shabreka Coulon, MD   

## 2018-11-05 ENCOUNTER — Ambulatory Visit: Payer: Medicare Other | Admitting: Allergy & Immunology

## 2018-11-06 ENCOUNTER — Telehealth (INDEPENDENT_AMBULATORY_CARE_PROVIDER_SITE_OTHER): Payer: Self-pay | Admitting: Physical Medicine and Rehabilitation

## 2018-11-06 NOTE — Telephone Encounter (Signed)
Can you call patient to schedule? 

## 2018-11-06 NOTE — Telephone Encounter (Signed)
Called pt and lvm #1 

## 2018-11-06 NOTE — Telephone Encounter (Signed)
Notification or Prior Authorization is not required for the requested services  This [employer] members plan does not currently require a prior authorization for [id number]  Decision ID #:[id number]

## 2018-11-06 NOTE — Telephone Encounter (Signed)
Can try epidural interlam at L4-5

## 2018-11-13 ENCOUNTER — Ambulatory Visit (INDEPENDENT_AMBULATORY_CARE_PROVIDER_SITE_OTHER): Payer: Medicare Other | Admitting: *Deleted

## 2018-11-13 DIAGNOSIS — J309 Allergic rhinitis, unspecified: Secondary | ICD-10-CM | POA: Diagnosis not present

## 2018-11-19 DIAGNOSIS — M791 Myalgia, unspecified site: Secondary | ICD-10-CM | POA: Diagnosis not present

## 2018-11-19 DIAGNOSIS — G43719 Chronic migraine without aura, intractable, without status migrainosus: Secondary | ICD-10-CM | POA: Diagnosis not present

## 2018-11-19 DIAGNOSIS — M542 Cervicalgia: Secondary | ICD-10-CM | POA: Diagnosis not present

## 2018-11-22 IMAGING — US US SOFT TISSUE HEAD/NECK
1 series · 14 of 25 positions shown · non-contrast
Comparison: None.

CLINICAL DATA: Neck pain and dysphagia.

EXAM:
THYROID ULTRASOUND
TECHNIQUE: Ultrasound examination of the thyroid gland and adjacent soft
tissues was performed.

[Series 1: us soft tissue head/neck · 0.06mm/px · 14 of 35 slices shown]
[im 1/35]
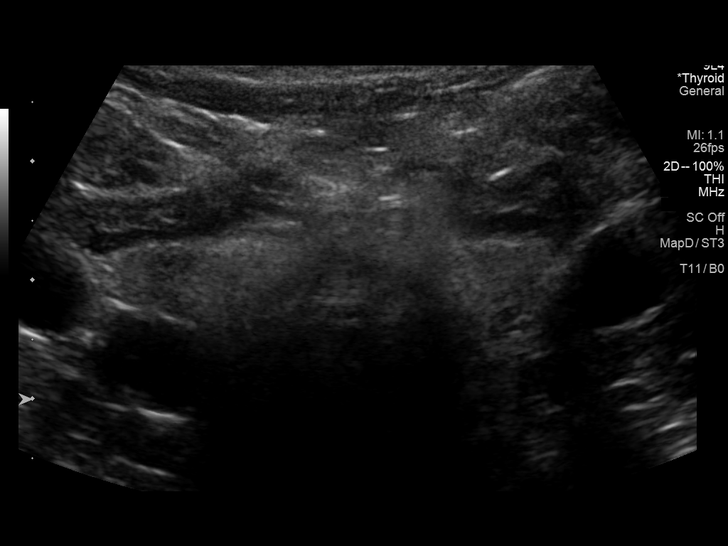
[im 3/35]
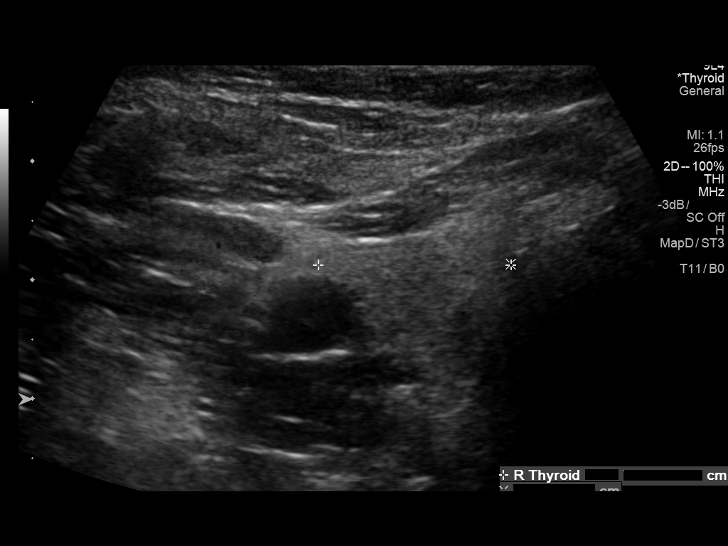
[im 6/35]
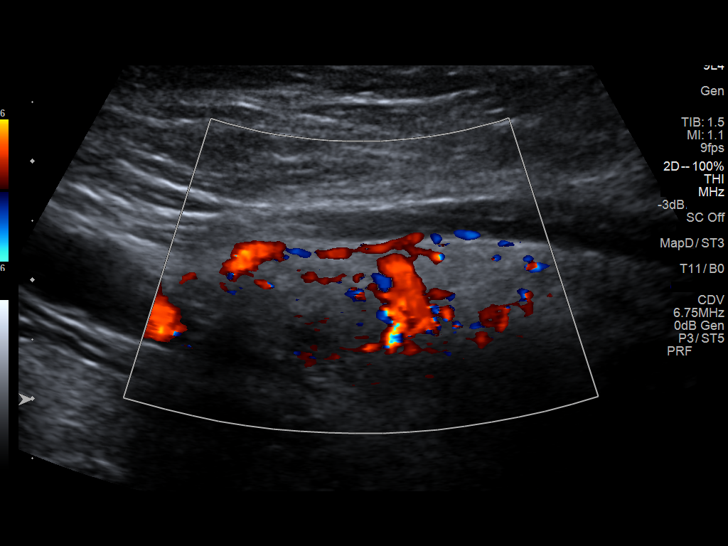
[im 9/35]
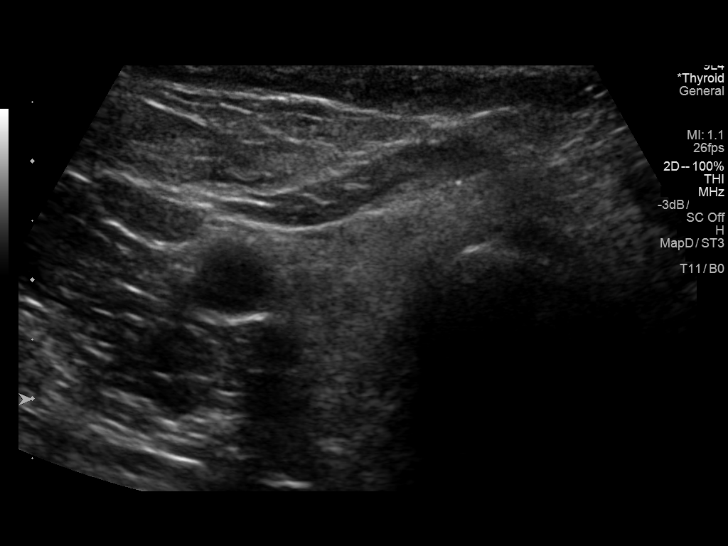
[im 12/35]
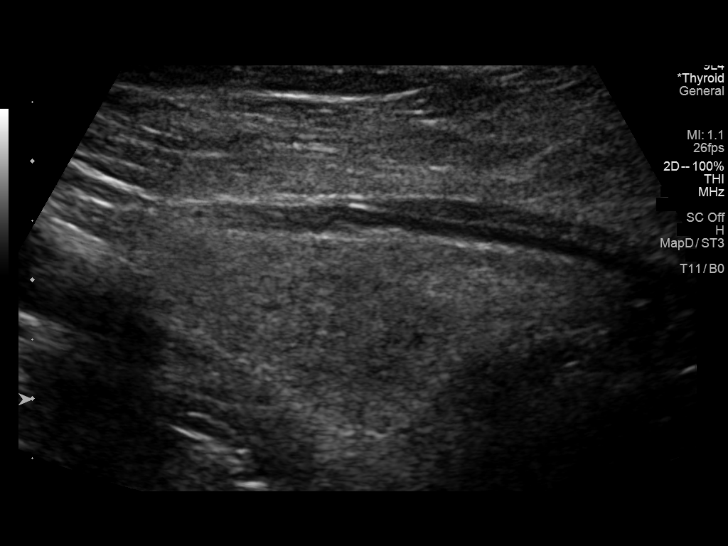
[im 13/35]
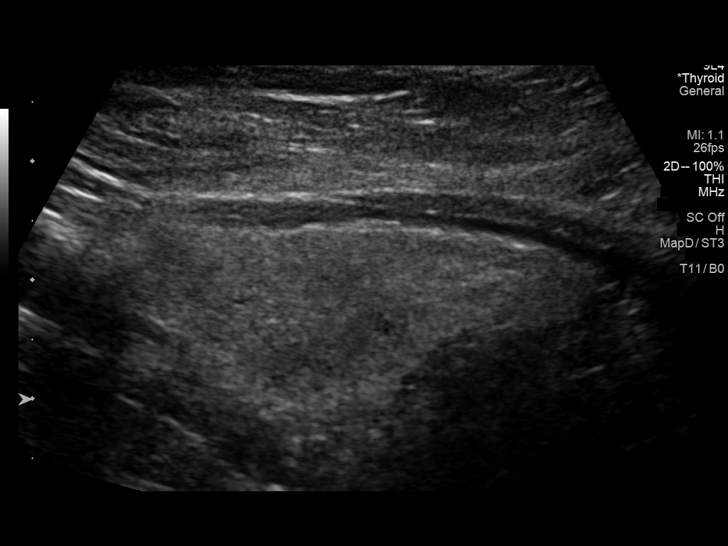
[im 16/35]
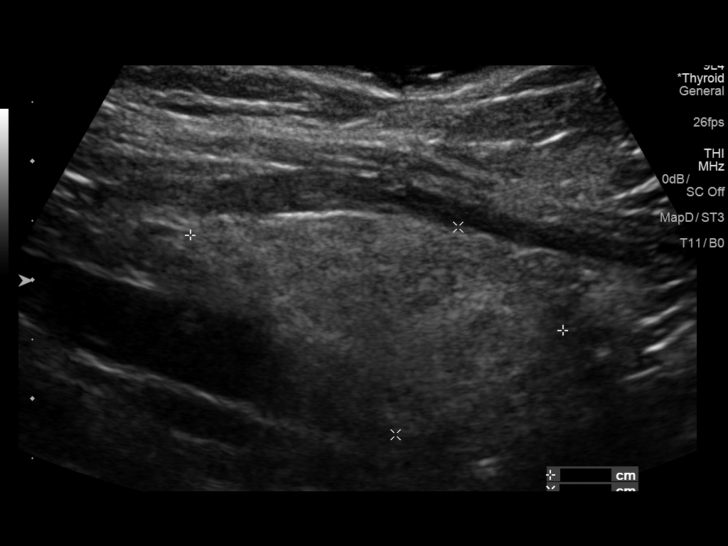
[im 19/35]
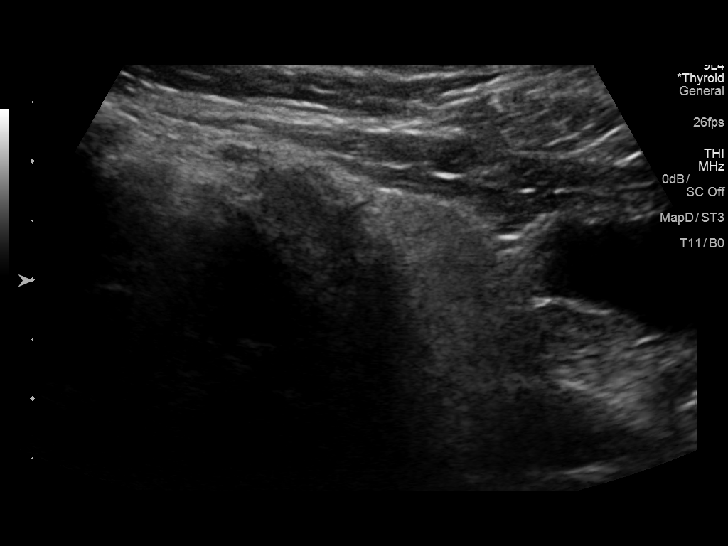
[im 22/35]
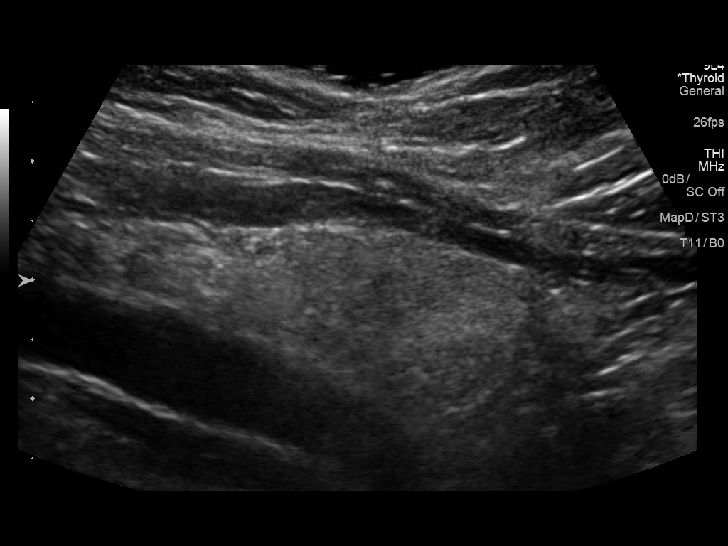
[im 23/35]
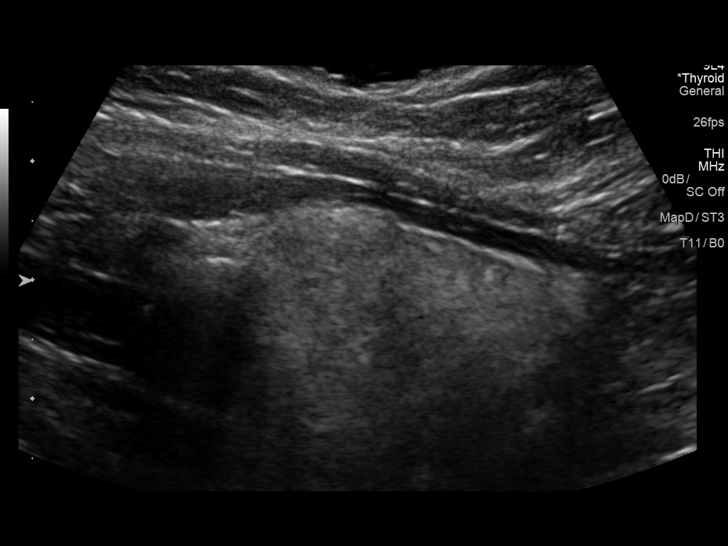
[im 26/35]
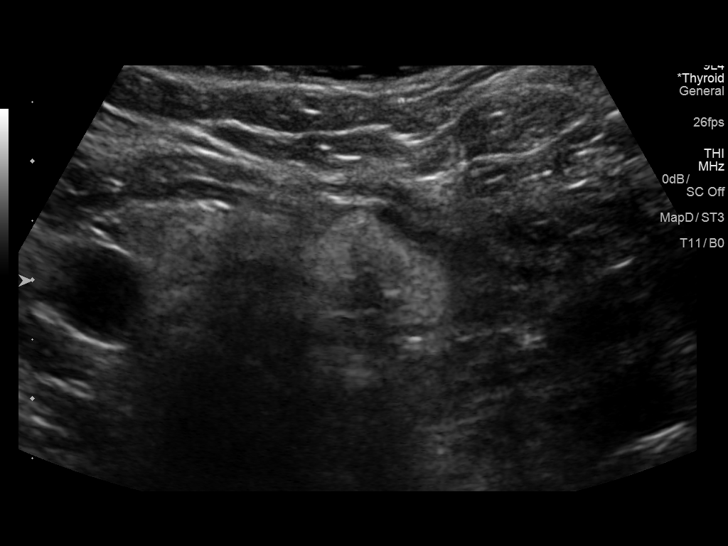
[im 29/35]
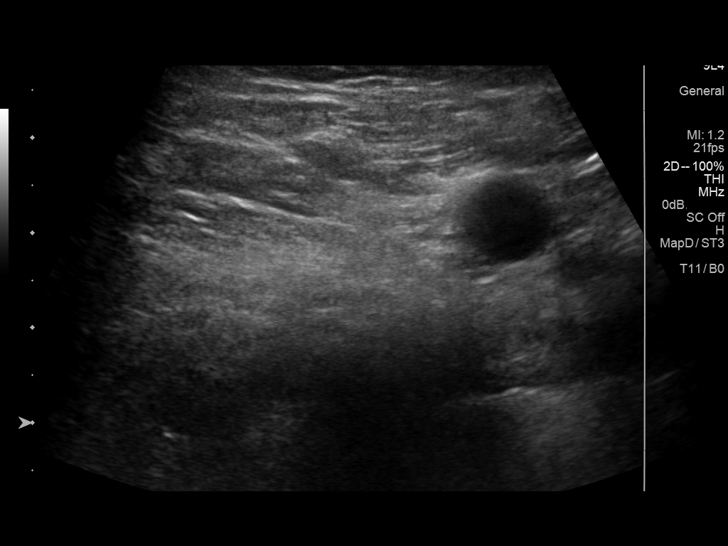
[im 32/35]
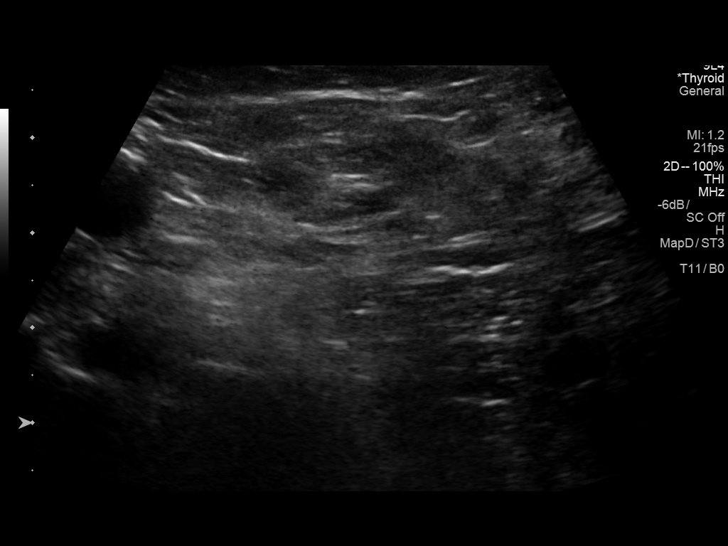
[im 35/35]
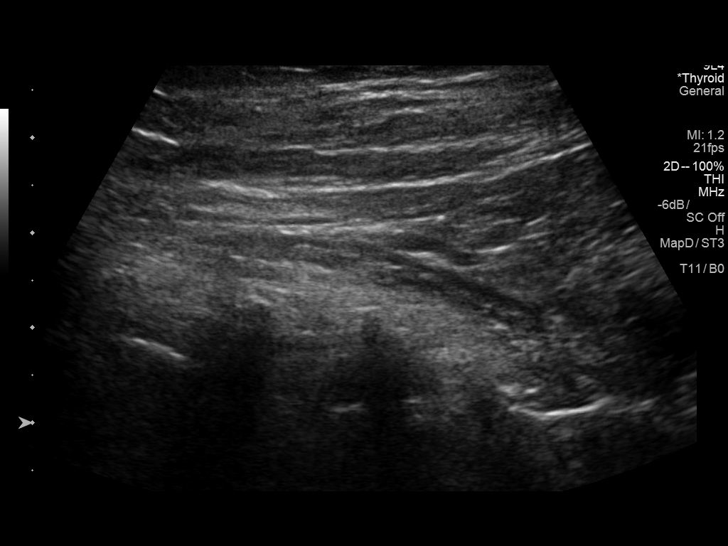

[14 of 25 positions shown; findings below may reference images not displayed]

FINDINGS: Parenchymal Echotexture: Mildly heterogenous

Isthmus: 1.0 cm in the AP dimension.

Right lobe: 3.9 x 2.0 x 1.6 cm

Left lobe: 3.2 x 1.8 x 1.0 cm

_________________________________________________________

Estimated total number of nodules >/= 1 cm: 0

Number of spongiform nodules >/=  2 cm not described below (TR1): 0

Number of mixed cystic and solid nodules >/= 1.5 cm not described
below (TR2): 0

_________________________________________________________

No discrete nodules are seen within the thyroid gland. No enlarged
lymph nodes identified.
IMPRESSION: Thyroid tissue is mildly heterogeneous. No discrete thyroid nodules.

## 2018-11-27 ENCOUNTER — Ambulatory Visit (INDEPENDENT_AMBULATORY_CARE_PROVIDER_SITE_OTHER): Payer: Medicare Other

## 2018-11-27 DIAGNOSIS — J309 Allergic rhinitis, unspecified: Secondary | ICD-10-CM

## 2018-11-30 ENCOUNTER — Ambulatory Visit (INDEPENDENT_AMBULATORY_CARE_PROVIDER_SITE_OTHER): Payer: Self-pay

## 2018-11-30 ENCOUNTER — Ambulatory Visit (INDEPENDENT_AMBULATORY_CARE_PROVIDER_SITE_OTHER): Payer: Medicare Other

## 2018-11-30 ENCOUNTER — Ambulatory Visit (INDEPENDENT_AMBULATORY_CARE_PROVIDER_SITE_OTHER): Payer: Medicare Other | Admitting: Physical Medicine and Rehabilitation

## 2018-11-30 ENCOUNTER — Encounter (INDEPENDENT_AMBULATORY_CARE_PROVIDER_SITE_OTHER): Payer: Self-pay | Admitting: Physical Medicine and Rehabilitation

## 2018-11-30 ENCOUNTER — Ambulatory Visit: Payer: Medicare Other

## 2018-11-30 ENCOUNTER — Other Ambulatory Visit: Payer: Self-pay

## 2018-11-30 VITALS — BP 125/84 | Temp 98.5°F

## 2018-11-30 DIAGNOSIS — M5416 Radiculopathy, lumbar region: Secondary | ICD-10-CM | POA: Diagnosis not present

## 2018-11-30 DIAGNOSIS — J4550 Severe persistent asthma, uncomplicated: Secondary | ICD-10-CM | POA: Diagnosis not present

## 2018-11-30 DIAGNOSIS — M545 Low back pain: Secondary | ICD-10-CM

## 2018-11-30 MED ORDER — METHYLPREDNISOLONE ACETATE 80 MG/ML IJ SUSP: 40.0000 mg | Freq: Once | INTRAMUSCULAR | Status: DC

## 2018-11-30 NOTE — Progress Notes (Signed)
.     Numeric Pain Rating Scale and Functional Assessment Average Pain 10   In the last MONTH (on 0-10 scale) has pain interfered with the following?  1. General activity like being  able to carry out your everyday physical activities such as walking, climbing stairs, carrying groceries, or moving a chair?  Rating(7)   +Driver, -BT, -Dye Allergies.  

## 2018-12-01 ENCOUNTER — Ambulatory Visit (INDEPENDENT_AMBULATORY_CARE_PROVIDER_SITE_OTHER): Payer: Medicare Other | Admitting: *Deleted

## 2018-12-01 DIAGNOSIS — E538 Deficiency of other specified B group vitamins: Secondary | ICD-10-CM | POA: Diagnosis not present

## 2018-12-01 MED ORDER — CYANOCOBALAMIN 1000 MCG/ML IJ SOLN
1000.0000 ug | Freq: Once | INTRAMUSCULAR | Status: AC
  Administered 2018-12-01: 1000 ug via INTRAMUSCULAR

## 2018-12-02 ENCOUNTER — Ambulatory Visit: Payer: Self-pay

## 2018-12-02 NOTE — Progress Notes (Signed)
Lori [name] - 63 y.o. female MRN [id number]  Date of birth: 1955-10-18  Office Visit Note: Visit Date: 11/30/2018 PCP: Biagio Borg, MD Referred by: Biagio Borg, MD  Subjective: Chief Complaint  Patient presents with  . Lower Back - Pain   HPI: Lori [name] is a 63 y.o. female who comes in today Returns today for planned epidural injection.  Prior facet joint blocks were not beneficial.  Please see our prior notes for further details and justification of her pain complaints which are mainly axial low back pain with some referral to the buttocks.  No leg pain.  She has chronic pain syndrome and fibromyalgia and a host of other issues going on.  If the epidural injection is not very beneficial then she should continue with her current pain management plans to [facility] pain management.  ROS Otherwise per HPI.  Assessment & Plan: Visit Diagnoses:  1. Lumbar radiculopathy     Plan: No additional findings.   Meds & Orders:  Meds ordered this encounter  Medications  . methylPREDNISolone acetate (DEPO-MEDROL) injection 40 mg    Orders Placed This Encounter  Procedures  . XR C-ARM NO REPORT  . Epidural Steroid injection    Follow-up: Return if symptoms worsen or fail to improve.   Procedures: No procedures performed  Lumbar Epidural Steroid Injection - Interlaminar Approach with Fluoroscopic Guidance  Patient: Lori [name]      Date of Birth: August 13, 1956 MRN: [id number] PCP: Biagio Borg, MD      Visit Date: 11/30/2018   Universal Protocol:     Consent Given By: the patient  Position: PRONE  Additional Comments: Vital signs were monitored before and after the procedure. Patient was prepped and draped in the usual sterile fashion. The correct patient, procedure, and site was verified.   Injection Procedure Details:  Procedure Site One Meds Administered:  Meds ordered this encounter  Medications  . methylPREDNISolone acetate (DEPO-MEDROL) injection 40  mg     Laterality: Right  Location/Site:  L4-L5  Needle size: 20 G  Needle type: Tuohy  Needle Placement: Paramedian epidural  Findings:   -Comments: Excellent flow of contrast into the epidural space.  Procedure Details: Using a paramedian approach from the side mentioned above, the region overlying the inferior lamina was localized under fluoroscopic visualization and the soft tissues overlying this structure were infiltrated with 4 ml. of 1% Lidocaine without Epinephrine. The Tuohy needle was inserted into the epidural space using a paramedian approach.   The epidural space was localized using loss of resistance along with lateral and bi-planar fluoroscopic views.  After negative aspirate for air, blood, and CSF, a 2 ml. volume of Isovue-250 was injected into the epidural space and the flow of contrast was observed. Radiographs were obtained for documentation purposes.    The injectate was administered into the level noted above.   Additional Comments:  The patient tolerated the procedure well Dressing: 2 x 2 sterile gauze and Band-Aid    Post-procedure details: Patient was observed during the procedure. Post-procedure instructions were reviewed.  Patient left the clinic in stable condition.   Clinical History: MRI LUMBAR SPINE WITHOUT CONTRAST  TECHNIQUE: Multiplanar, multisequence MR imaging of the lumbar spine was performed. No intravenous contrast was administered.  COMPARISON:  MRI lumbar spine 01/18/2016.  FINDINGS: Segmentation:  Standard.  Alignment:  Normal.  Vertebrae: No fracture or worrisome lesion. A few small Schmorl's nodes are noted.  Conus medullaris and cauda equina: Conus  extends to the L1 level. Conus and cauda equina appear normal.  Paraspinal and other soft tissues: Negative.  Disc levels:  T11-12 is imaged in the sagittal plane only and negative.  T12-L1: Negative.  L1-2: Negative.  L2-3: Annular fissure and tiny  central protrusion. No stenosis. No change compared to the prior exam.  L3-4: Negative.  L4-5: Mild-to-moderate facet degenerative change and a shallow disc bulge. No stenosis.  L5-S1: Negative.  IMPRESSION: No change in mild degenerative disease of the lumbar spine at L2-3 and L4-5. The central canal and foramina are widely patent at all levels.   Electronically Signed   By: Lori [name] M.D.   On: 09/01/2018 16:03   She reports that she has quit smoking. Her smoking use included cigarettes. She started smoking about 41 years ago. She has a 39.00 pack-year smoking history. She has never used smokeless tobacco. No results for input(s): HGBA1C, LABURIC in the last 8760 hours.  Objective:  VS:  HT:    WT:   BMI:     BP:125/84  HR: bpm  TEMP:98.5 F (36.9 C)(Oral)  RESP:  Physical Exam  Ortho Exam Imaging: No results found.  Past Medical/Family/Surgical/Social History: Medications & Allergies reviewed per EMR, new medications updated. Patient Active Problem List   Diagnosis Date Noted  . B12 deficiency 10/20/2018  . Nausea 04/27/2018  . Anemia, iron deficiency 04/27/2018  . Intermittent paresthesia of hand and foot 04/27/2018  . Headache 03/21/2018  . Lumbar radiculitis 08/15/2017  . Degenerative disc disease, lumbar 07/21/2017  . Encounter for well adult exam with abnormal findings 06/30/2017  . Epigastric pain 06/30/2017  . Restrictive lung disease 06/30/2017  . Leg cramping 06/09/2017  . Elbow contusion 06/09/2017  . Right elbow pain 05/27/2017  . Asthma-COPD overlap syndrome ([place]) 01/31/2017  . Dizziness 01/20/2017  . Seasonal and perennial allergic rhinitis 01/01/2017  . Moderate persistent asthma, uncomplicated 40/98/1191  . Pulmonary emphysema ([place]) 12/03/2016  . Tobacco use disorder 12/03/2016  . Chronic seasonal allergic rhinitis 12/03/2016  . Medication management 11/29/2016  . Cough 11/23/2016  . Rash 11/23/2016  . Vertigo 10/22/2016  .  Gastroesophageal reflux disease with esophagitis 10/22/2016  . Non compliance w medication regimen 08/01/2016  . Allergic contact dermatitis due to metals 08/01/2016  . Migraine without aura and without status migrainosus, not intractable 07/22/2016  . Spasm of muscle of lower back 06/21/2016  . Chronic rhinitis 06/19/2016  . Gait difficulty 06/21/2015  . Fibromyalgia 06/21/2015  . Conversion reaction 06/21/2015  . Depression 06/21/2015  . Syncope and collapse 06/21/2015  . Plica syndrome of left knee 01/06/2015  . Anxiety state 09/06/2014  . History of MI 09/06/2014  . Cigarette smoker 06/27/2013  . Borderline hypertension 06/27/2013  . Chronic low back pain with right-sided sciatica 06/14/2013  . Leg pain, bilateral 06/14/2013  . Gastroparesis 10/26/2009  . Musculoskeletal chest pain 05/02/2009  . Hyperlipidemia LDL goal <100 11/18/2008  . COPD exacerbation ([place]) 11/18/2008  . HEPATIC CYST 11/18/2008   Past Medical History:  Diagnosis Date  . Acute MI ([place])    x3 - by report only. She has had 3 negative Myoview stress test with no evidence of prior infarct.  . Allergic rhinitis   . Allergy   . Anemia   . Ankle fracture 05/2016  . Anxiety   . Arthritis   . Asthma   . Barrett esophagus 2007  . Chest pain 05-02-2009   echo  EF 55%  . COPD (chronic obstructive pulmonary disease)  with chronic bronchitis ([place])    & Emphysema  . Depression   . Duodenitis without mention of hemorrhage 2007  . Esophageal reflux 2007  . Esophageal stricture   . Full dentures   . H/O hiatal hernia   . Hiatal hernia 6808,8110  . Hyperlipidemia   . Multiple fractures    from falls, fx rt. elbow, fx left wrist, bilateral ankles  . Pneumonia 10/2016  . Restrictive lung disease 06/30/2017  . Ulcer    Family History  Problem Relation Age of Onset  . Emphysema Father   . Dementia Mother   . Diabetes Maternal Grandmother   . Diabetes Maternal Aunt   . Diabetes Unknown        mat. cousin  .  Heart disease Brother   . Colon cancer Neg Hx   . Allergic rhinitis Neg Hx   . Angioedema Neg Hx   . Asthma Neg Hx   . Atopy Neg Hx   . Eczema Neg Hx   . Immunodeficiency Neg Hx   . Urticaria Neg Hx    Past Surgical History:  Procedure Laterality Date  . ABDOMINAL HYSTERECTOMY     BSO  . APPENDECTOMY    . CESAREAN SECTION     x 2  . CHOLECYSTECTOMY    . CHONDROPLASTY Left 01/06/2015   Procedure: CHONDROPLASTY;  Surgeon: Marybelle Killings, MD;  Location: [facility];  Service: Orthopedics;  Laterality: Left;  . COLONOSCOPY    . ELBOW FRACTURE SURGERY     right  . KNEE ARTHROSCOPY WITH EXCISION PLICA Left 11/28/9456   Procedure: KNEE ARTHROSCOPY WITH EXCISION PLICA;  Surgeon: Marybelle Killings, MD;  Location: [facility];  Service: Orthopedics;  Laterality: Left;  . Lower Extremity Arterial Dopplers  07/06/2013   RABI 1.0, LABI 1.1.; No evidence of significant vascular atherogenic plaque  . NECK SURGERY    . NM MYOVIEW LTD  09/2014   LOW RISK. Normal EF of 65% with no regional wall motion abnormalities. No ischemia or infarction.  . TONSILLECTOMY    . WRIST FRACTURE SURGERY     left, has plate   Social History   Occupational History  . Occupation: disable  Tobacco Use  . Smoking status: Former Smoker    Packs/day: 1.00    Years: 39.00    Pack years: 39.00    Types: Cigarettes    Start date: 08/06/1977  . Smokeless tobacco: Never Used  . Tobacco comment: stopped 10/22.   Substance and Sexual Activity  . Alcohol use: No    Alcohol/week: 0.0 standard drinks  . Drug use: No  . Sexual activity: Never

## 2018-12-02 NOTE — Procedures (Signed)
Lumbar Epidural Steroid Injection - Interlaminar Approach with Fluoroscopic Guidance  Patient: Lori [name]      Date of Birth: 01-Dec-1955 MRN: [id number] PCP: Biagio Borg, MD      Visit Date: 11/30/2018   Universal Protocol:     Consent Given By: the patient  Position: PRONE  Additional Comments: Vital signs were monitored before and after the procedure. Patient was prepped and draped in the usual sterile fashion. The correct patient, procedure, and site was verified.   Injection Procedure Details:  Procedure Site One Meds Administered:  Meds ordered this encounter  Medications  . methylPREDNISolone acetate (DEPO-MEDROL) injection 40 mg     Laterality: Right  Location/Site:  L4-L5  Needle size: 20 G  Needle type: Tuohy  Needle Placement: Paramedian epidural  Findings:   -Comments: Excellent flow of contrast into the epidural space.  Procedure Details: Using a paramedian approach from the side mentioned above, the region overlying the inferior lamina was localized under fluoroscopic visualization and the soft tissues overlying this structure were infiltrated with 4 ml. of 1% Lidocaine without Epinephrine. The Tuohy needle was inserted into the epidural space using a paramedian approach.   The epidural space was localized using loss of resistance along with lateral and bi-planar fluoroscopic views.  After negative aspirate for air, blood, and CSF, a 2 ml. volume of Isovue-250 was injected into the epidural space and the flow of contrast was observed. Radiographs were obtained for documentation purposes.    The injectate was administered into the level noted above.   Additional Comments:  The patient tolerated the procedure well Dressing: 2 x 2 sterile gauze and Band-Aid    Post-procedure details: Patient was observed during the procedure. Post-procedure instructions were reviewed.  Patient left the clinic in stable condition.

## 2018-12-05 IMAGING — US US ABDOMEN COMPLETE
1 series · 13 of 25 positions shown · non-contrast
Comparison: None.

CLINICAL DATA: Generalized abdominal pain with bloating

EXAM:
ABDOMEN ULTRASOUND COMPLETE

[Series 1: us abdomen complete · 0.22mm/px · 13 of 77 slices shown]
[im 1/77]
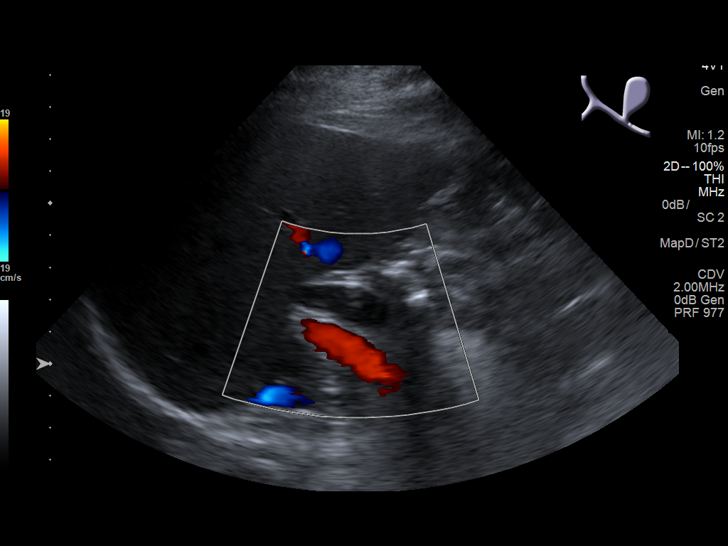
[im 7/77]
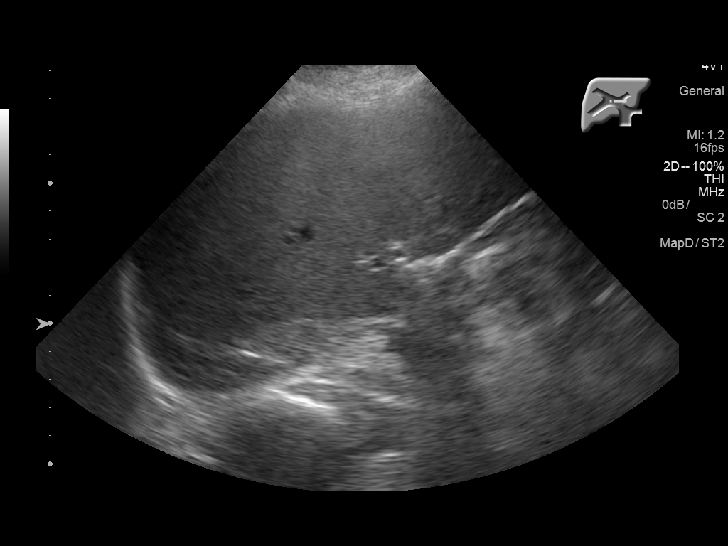
[im 13/77]
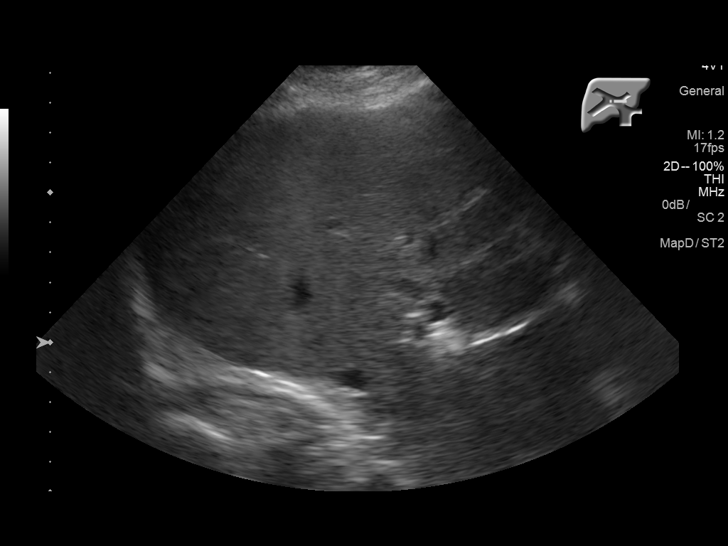
[im 20/77]
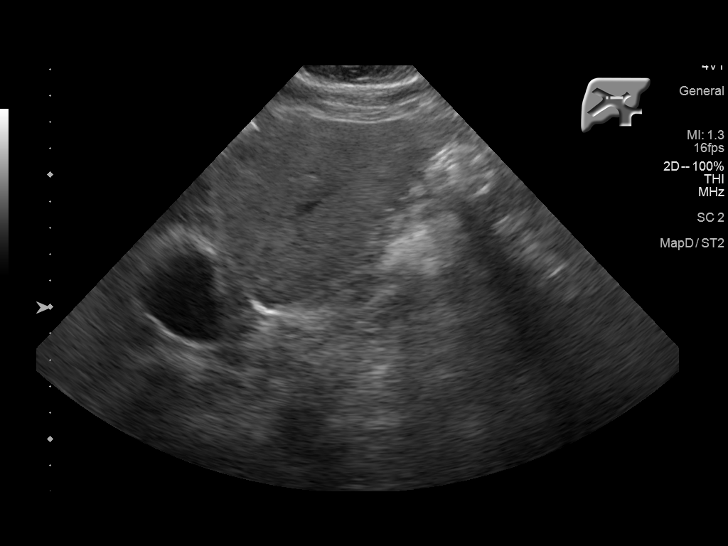
[im 26/77]
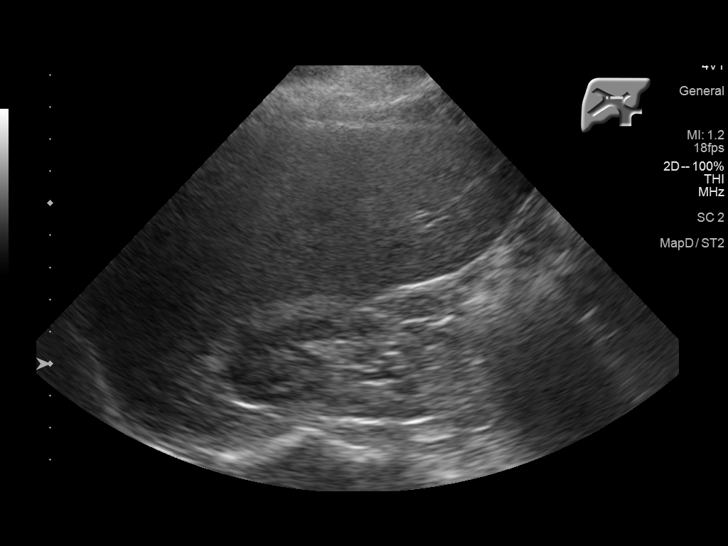
[im 32/77]
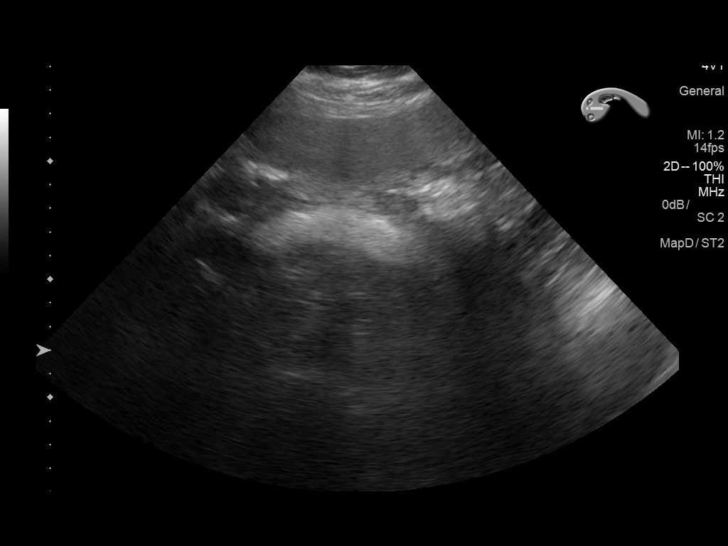
[im 39/77]
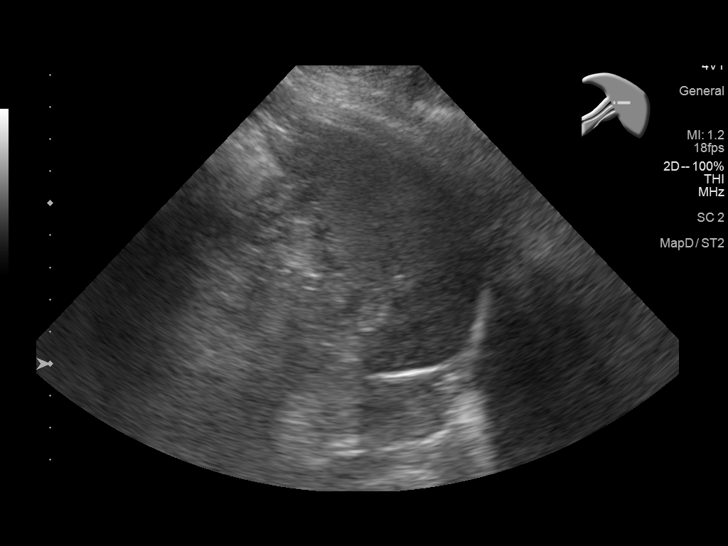
[im 45/77]
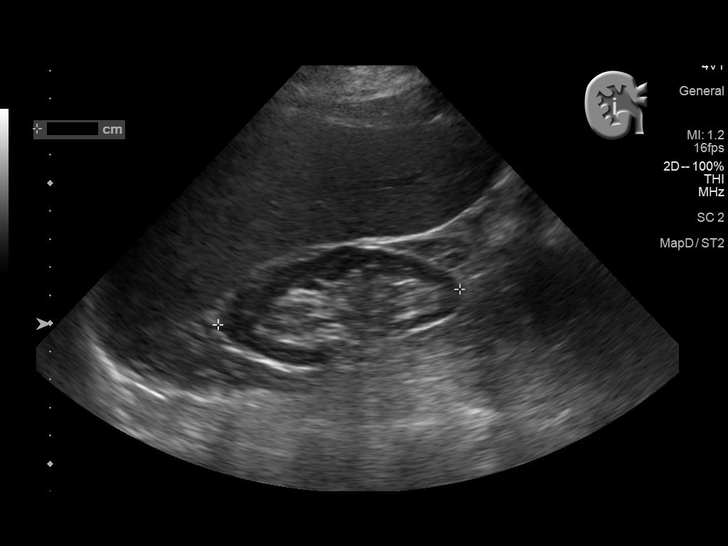
[im 51/77]
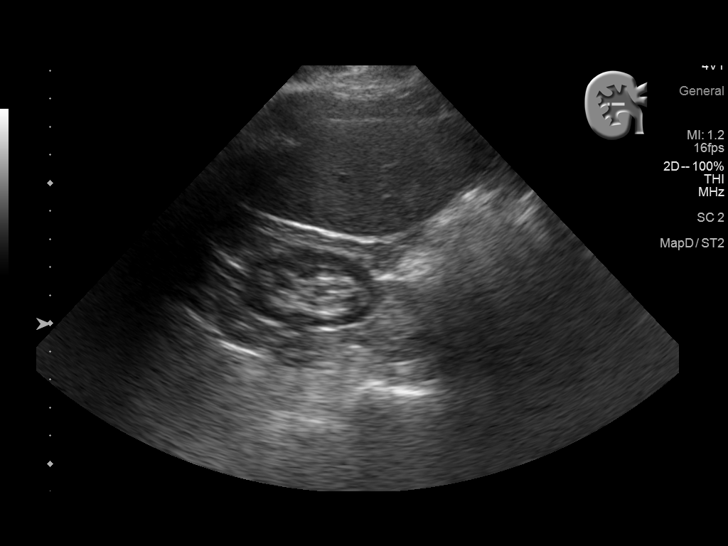
[im 58/77]
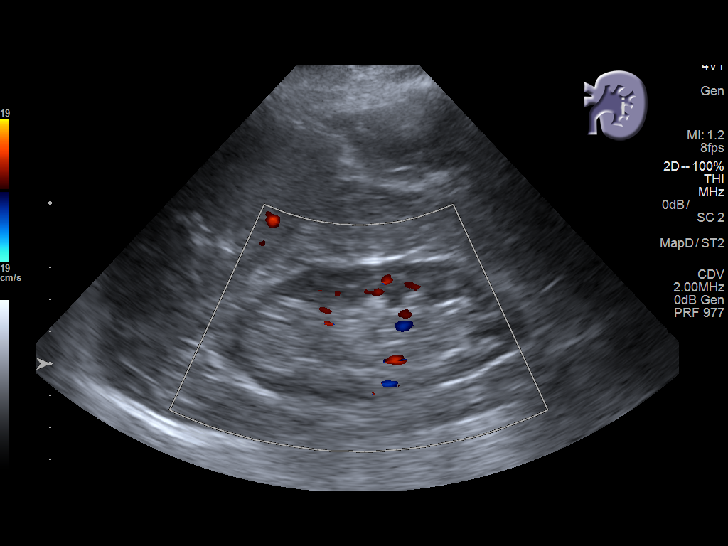
[im 64/77]
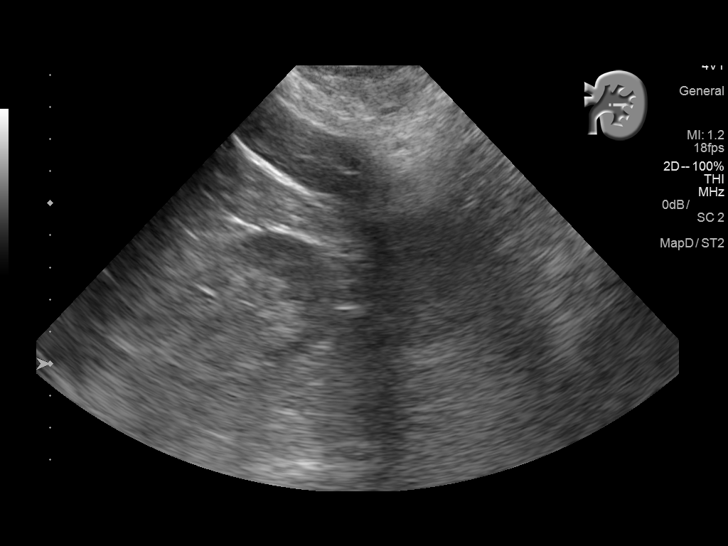
[im 70/77]
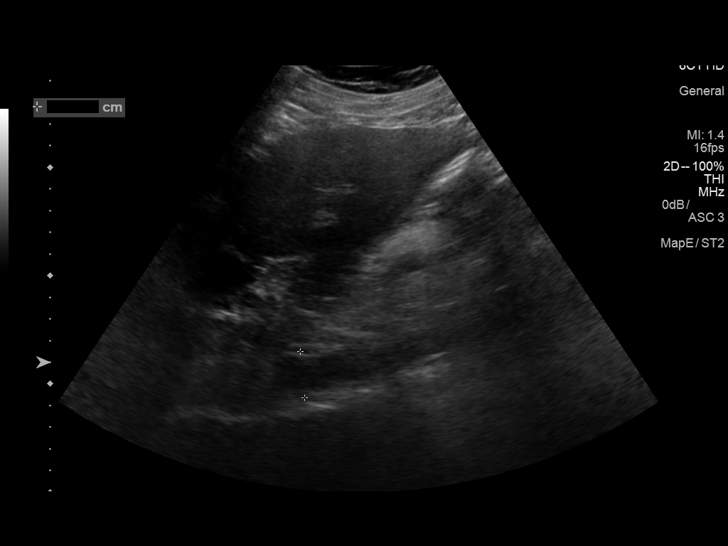
[im 77/77]
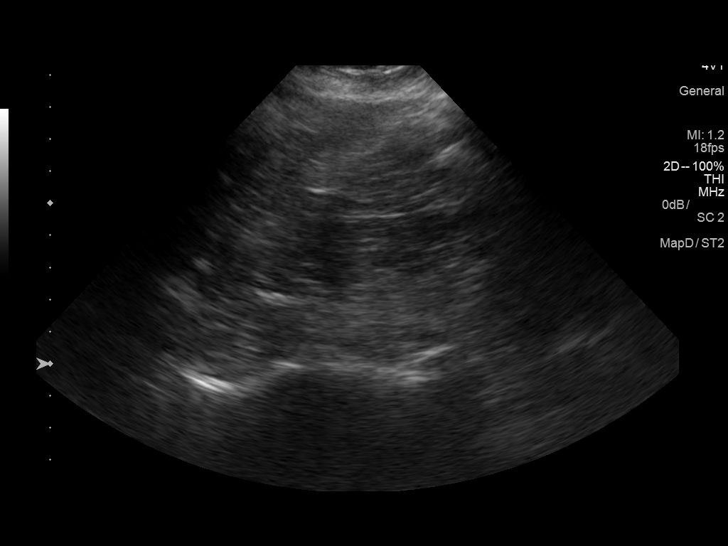

[13 of 25 positions shown; findings below may reference images not displayed]

FINDINGS: Gallbladder: Surgically absent.

Common bile duct: Diameter: 10 mm, upper normal for post
cholecystectomy state. No biliary duct mass or calculus evident.

Liver: No focal lesion identified. Within normal limits in
parenchymal echogenicity.

IVC: No abnormality visualized.

Pancreas: Visualized portion unremarkable. Portions of pancreas
obscured by gas.

Spleen: Size and appearance within normal limits.

Right Kidney: Length: 8.8 cm. Echogenicity within normal limits.
There is renal cortical thinning. No mass or hydronephrosis
visualized.

Left Kidney: Length: 9.6 cm. Echogenicity within normal limits.
There is renal cortical thinning. No mass or hydronephrosis
visualized.

Abdominal aorta: No aneurysm visualized.

Other findings: No demonstrable ascites.
IMPRESSION: Kidneys are somewhat small with renal cortical thinning bilaterally.
Question a degree of underlying medical renal disease. No
obstructing focus evident.

Portions of pancreas obscured by gas. Visualized portions of
pancreas appear normal.

Gallbladder absent. Common bile duct upper normal in diameter for
post cholecystectomy state. )

## 2018-12-11 ENCOUNTER — Ambulatory Visit (INDEPENDENT_AMBULATORY_CARE_PROVIDER_SITE_OTHER): Payer: Medicare Other | Admitting: *Deleted

## 2018-12-11 DIAGNOSIS — J309 Allergic rhinitis, unspecified: Secondary | ICD-10-CM

## 2018-12-13 IMAGING — CR DG CHEST 2V
2 series · 2 of 2 positions shown · non-contrast
Comparison: 10/15/2015

CLINICAL DATA: Asthma, chest pain and shortness of breath.

EXAM:
CHEST  2 VIEW

[w chest pa]
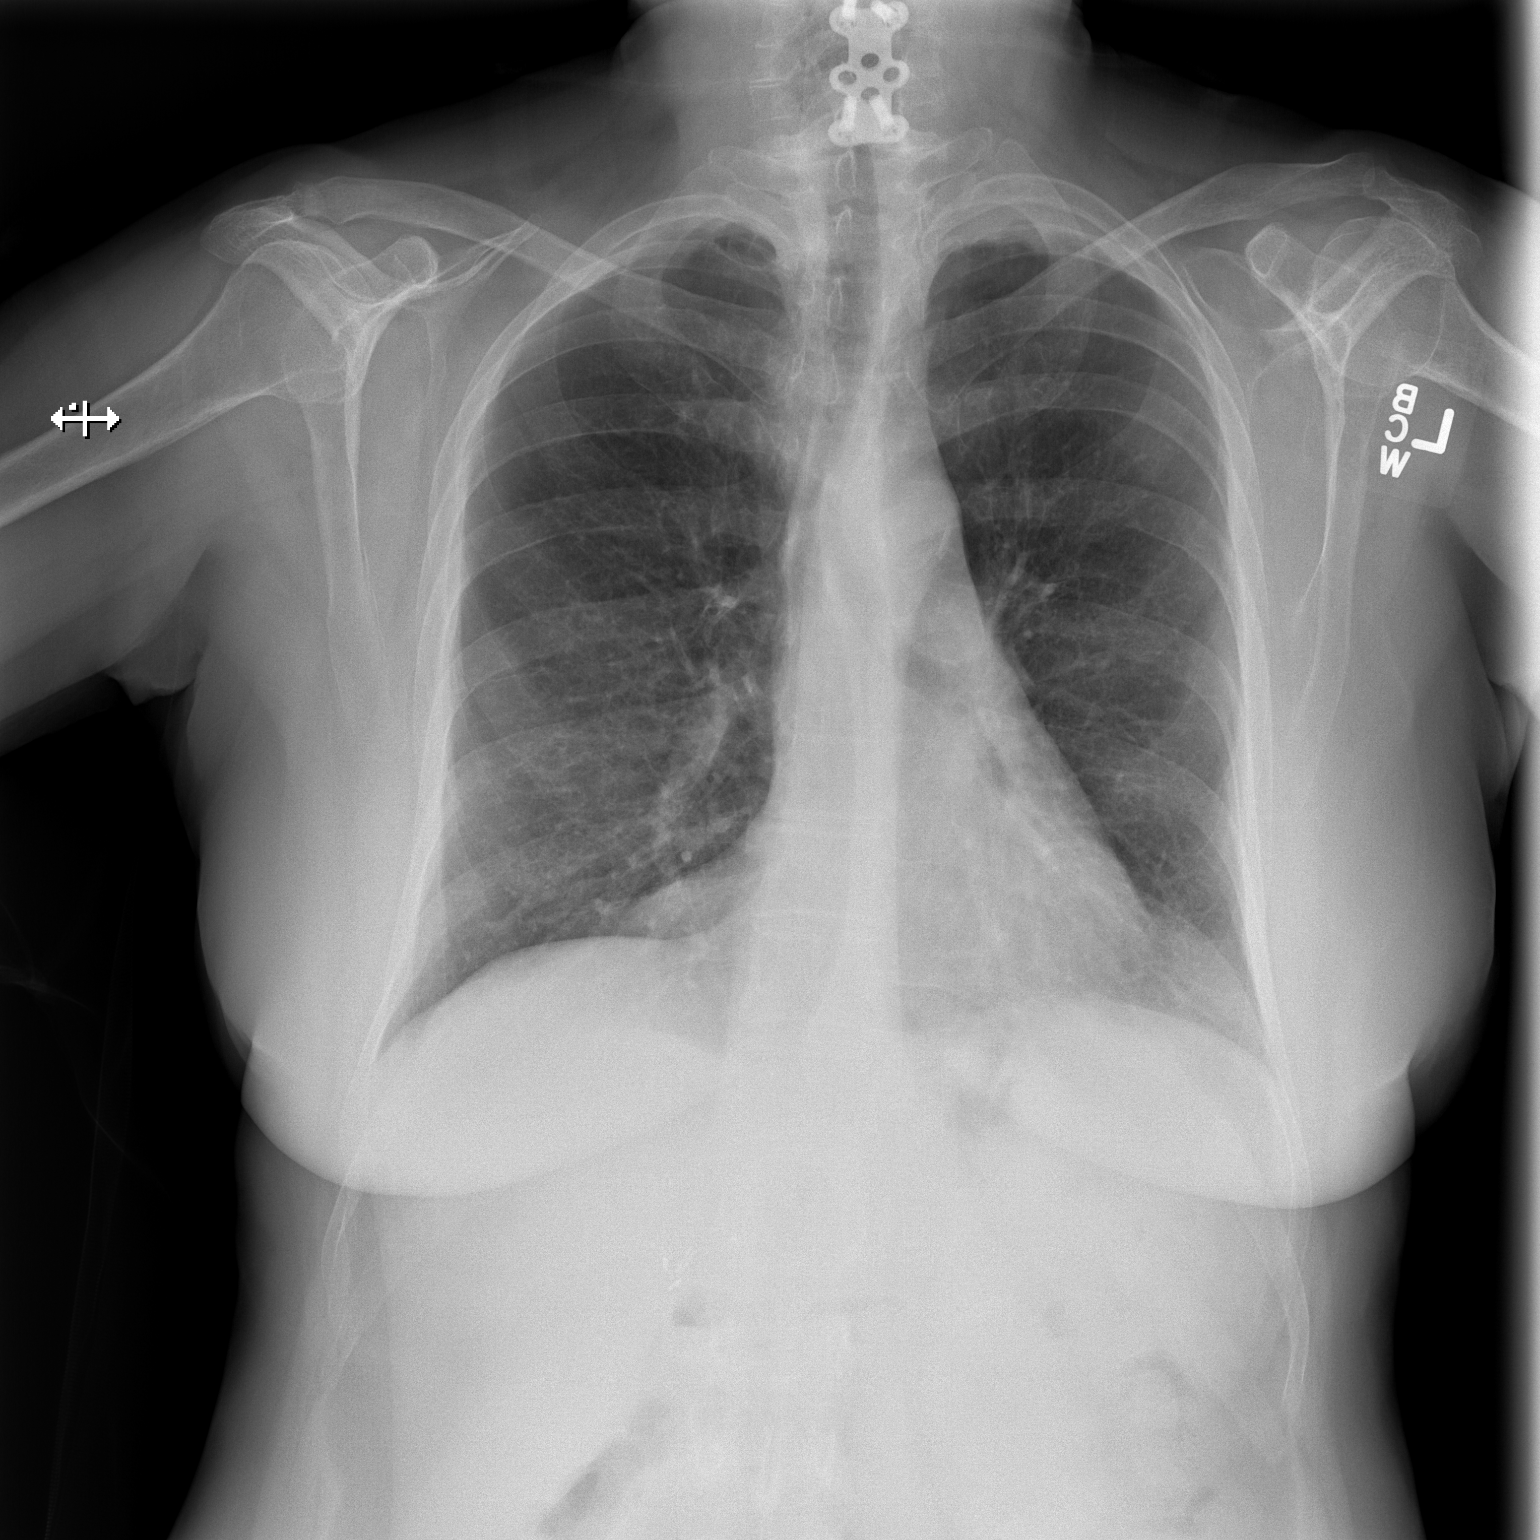

[w chest lat]
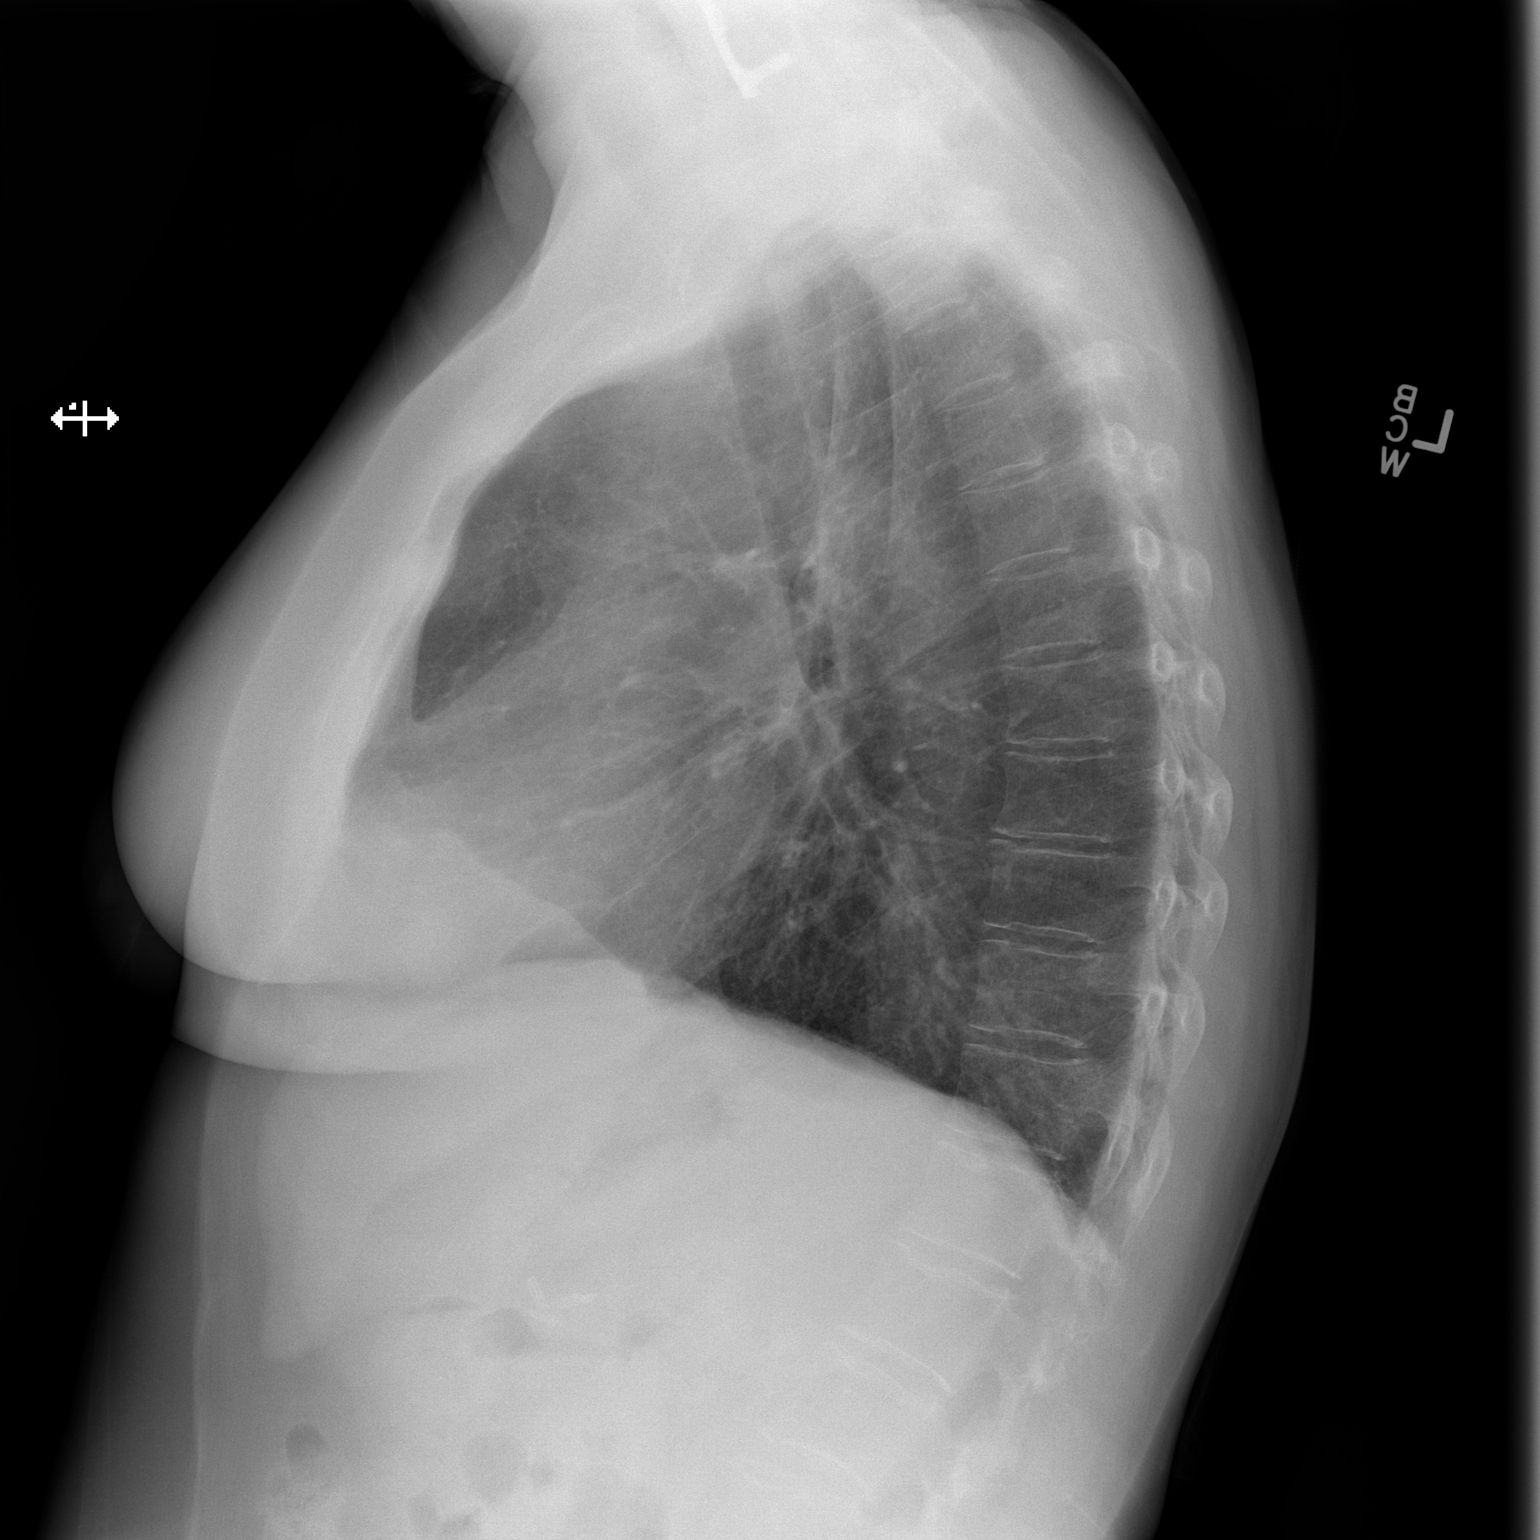

[2 of 2 positions shown; findings below may reference images not displayed]

FINDINGS: The heart size and mediastinal contours are within normal limits.
Stable chronic lung disease. No significant hyperinflation. There is
no evidence of pulmonary edema, consolidation, pneumothorax, nodule
or pleural fluid. The visualized skeletal structures are
unremarkable.
IMPRESSION: Stable chronic lung disease.  No acute findings.

## 2018-12-17 ENCOUNTER — Telehealth: Payer: Self-pay | Admitting: Internal Medicine

## 2018-12-17 MED ORDER — CYCLOBENZAPRINE HCL 5 MG PO TABS: ORAL_TABLET | ORAL | 1 refills | Status: DC

## 2018-12-17 NOTE — Telephone Encounter (Signed)
Copied from [place] [id number]. Topic: Quick Communication - Rx Refill/Question >> Dec 17, 2018  1:14 PM Selinda Flavin B, NT wrote: Medication: cyclobenzaprine (FLEXERIL) 5 MG tablet  Has the patient contacted their pharmacy? yes (Agent: If no, request that the patient contact the pharmacy for the refill.) (Agent: If yes, when and what did the pharmacy advise?)  Preferred Pharmacy (with phone number or street name): [facility] [place]  Agent: Please be advised that RX refills may take up to 3 business days. We ask that you follow-up with your pharmacy.

## 2018-12-20 IMAGING — CR DG CHEST 2V
2 series · 2 of 2 positions shown · non-contrast
Comparison: October 19, 2016

CLINICAL DATA: Chest pain.  Cold and chills.

EXAM:
CHEST  2 VIEW

[w chest lat]
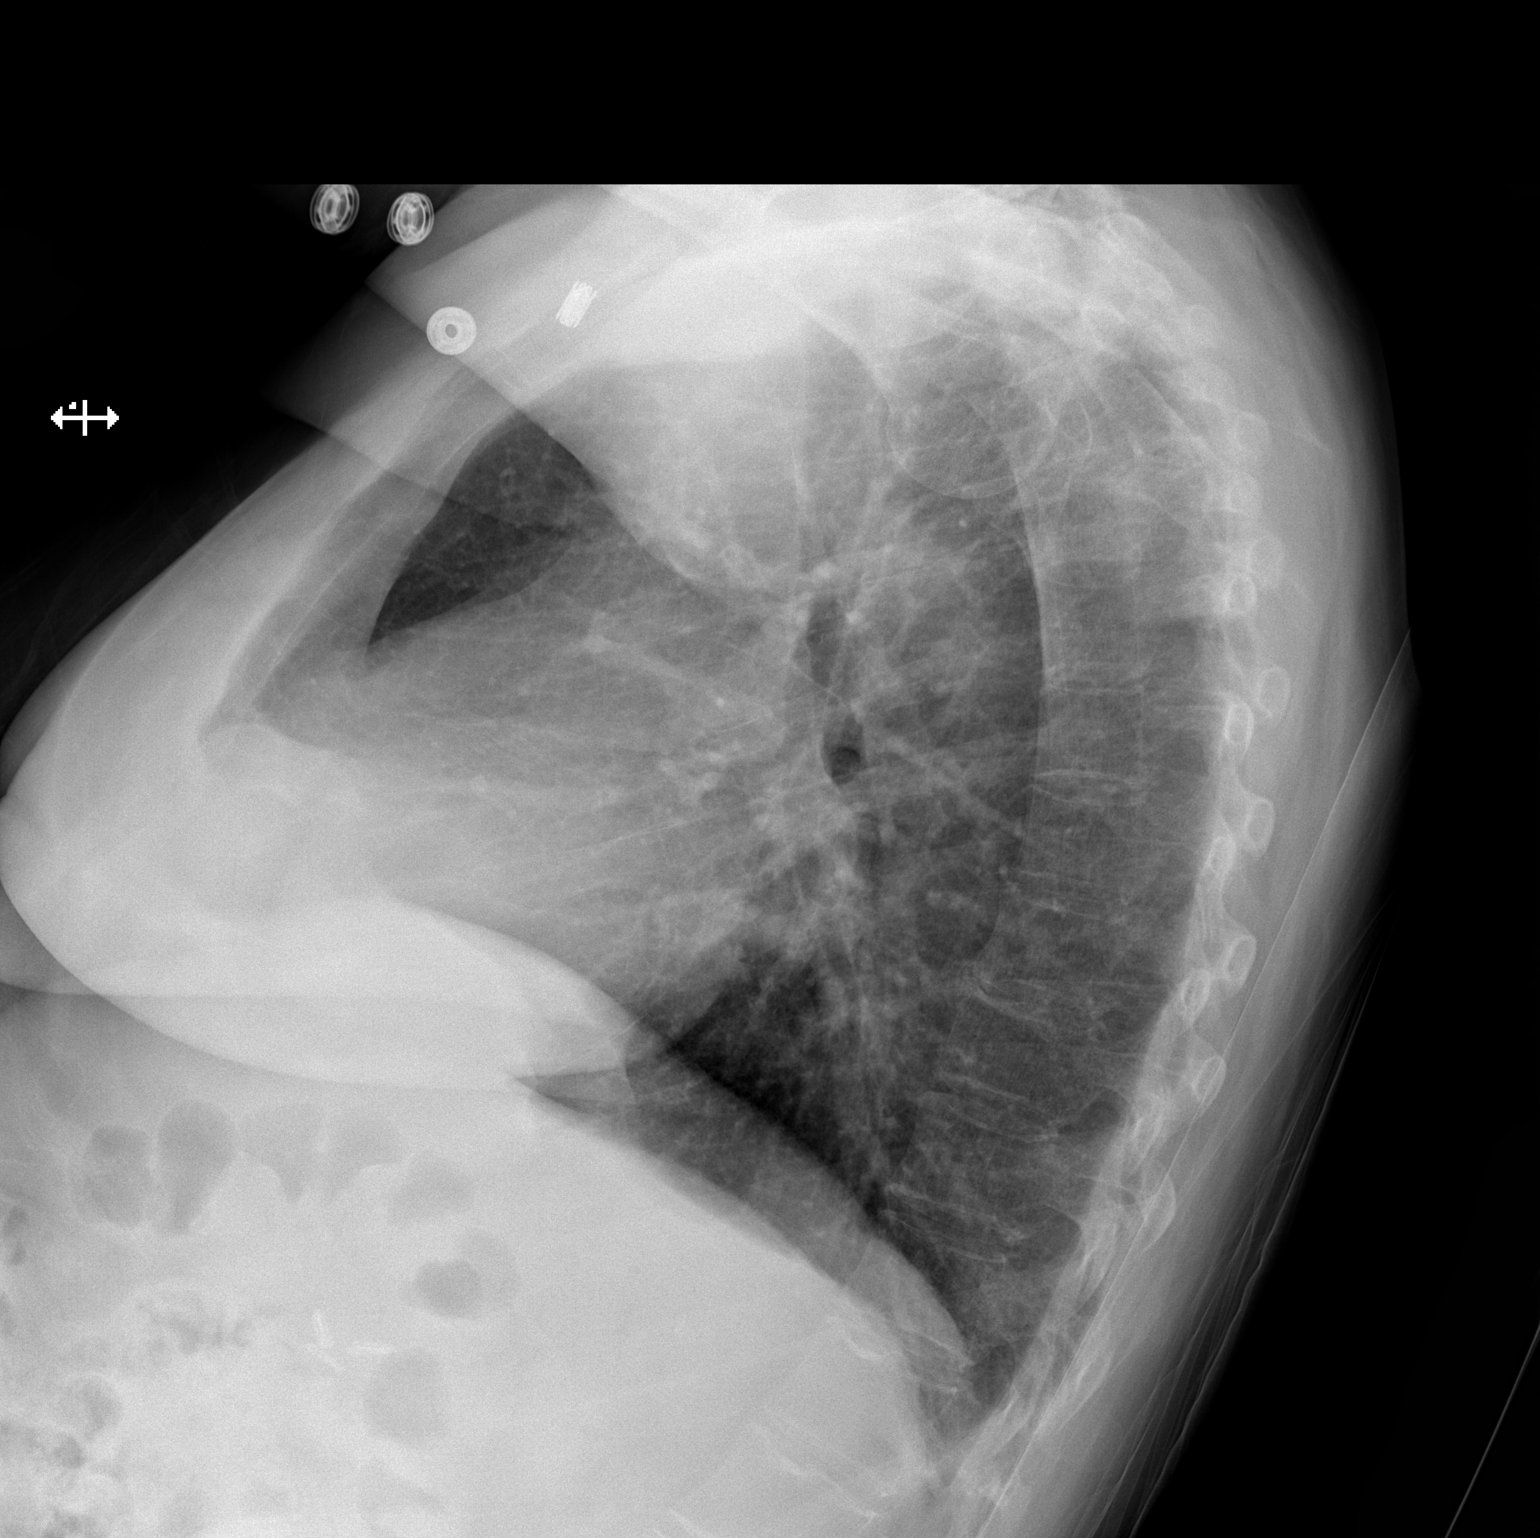

[x chest ap]
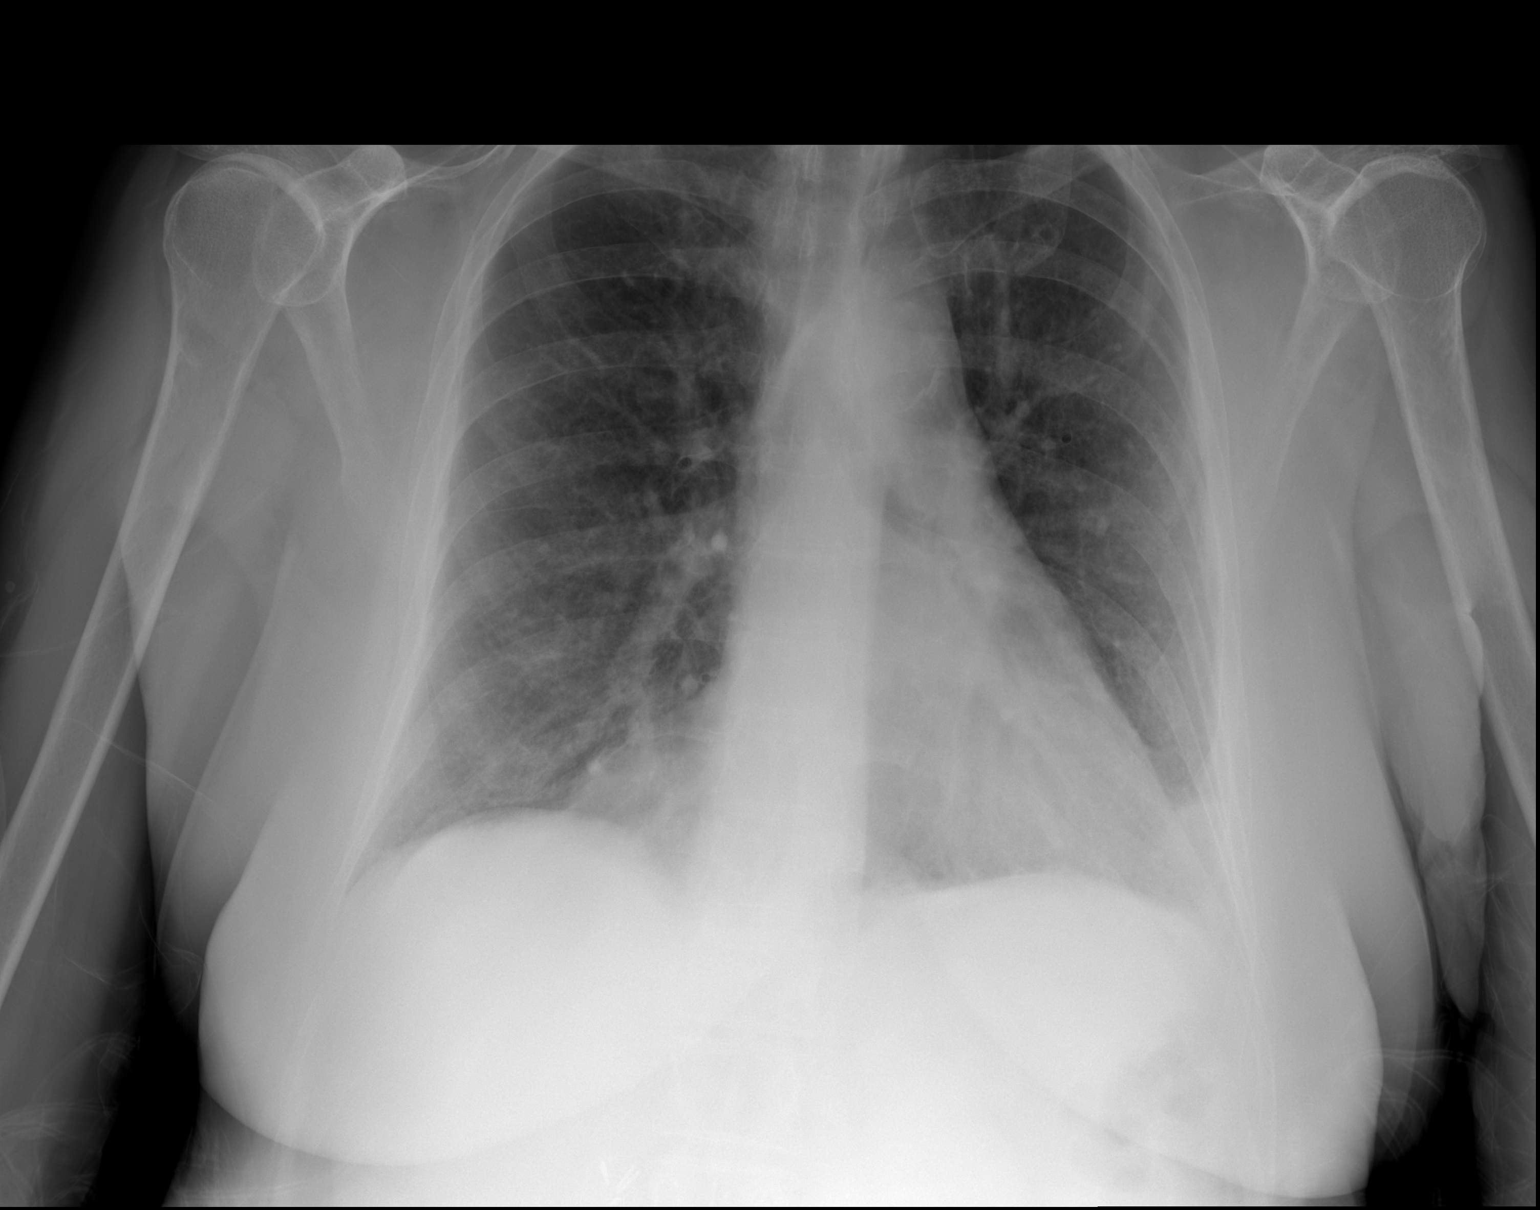

[2 of 2 positions shown; findings below may reference images not displayed]

FINDINGS: The cardiomediastinal silhouette is stable. Prominent epicardial fat
adjacent to the right side of the heart, unchanged since September 2015. Left greater than right pleuroparenchymal thickening in the
apices, unchanged. No pneumothorax. Mild opacity in the lateral left
lung base on only the frontal view. No overt edema. No other
abnormalities.
IMPRESSION: Possible mild opacity in the lateral left lung base. Recommend
follow-up to resolution. No other acute abnormalities.

## 2018-12-24 ENCOUNTER — Ambulatory Visit (INDEPENDENT_AMBULATORY_CARE_PROVIDER_SITE_OTHER): Payer: Medicare Other | Admitting: *Deleted

## 2018-12-24 DIAGNOSIS — J309 Allergic rhinitis, unspecified: Secondary | ICD-10-CM | POA: Diagnosis not present

## 2018-12-28 ENCOUNTER — Emergency Department (HOSPITAL_COMMUNITY): Payer: Medicare Other

## 2018-12-28 ENCOUNTER — Other Ambulatory Visit: Payer: Self-pay

## 2018-12-28 ENCOUNTER — Emergency Department (HOSPITAL_COMMUNITY)
Admission: EM | Admit: 2018-12-28 | Discharge: 2018-12-28 | Disposition: A | Discharge status: Home / Self Care | Payer: Medicare Other | Attending: Emergency Medicine | Admitting: Emergency Medicine

## 2018-12-28 ENCOUNTER — Encounter (HOSPITAL_COMMUNITY): Payer: Self-pay | Admitting: Emergency Medicine

## 2018-12-28 DIAGNOSIS — W0110XA Fall on same level from slipping, tripping and stumbling with subsequent striking against unspecified object, initial encounter: Secondary | ICD-10-CM | POA: Insufficient documentation

## 2018-12-28 DIAGNOSIS — S50812A Abrasion of left forearm, initial encounter: Secondary | ICD-10-CM | POA: Diagnosis not present

## 2018-12-28 DIAGNOSIS — W19XXXA Unspecified fall, initial encounter: Secondary | ICD-10-CM | POA: Diagnosis not present

## 2018-12-28 DIAGNOSIS — M542 Cervicalgia: Secondary | ICD-10-CM | POA: Diagnosis not present

## 2018-12-28 DIAGNOSIS — Z87891 Personal history of nicotine dependence: Secondary | ICD-10-CM | POA: Diagnosis not present

## 2018-12-28 DIAGNOSIS — S199XXA Unspecified injury of neck, initial encounter: Secondary | ICD-10-CM | POA: Diagnosis not present

## 2018-12-28 DIAGNOSIS — Y999 Unspecified external cause status: Secondary | ICD-10-CM | POA: Insufficient documentation

## 2018-12-28 DIAGNOSIS — Y939 Activity, unspecified: Secondary | ICD-10-CM | POA: Insufficient documentation

## 2018-12-28 DIAGNOSIS — S90112A Contusion of left great toe without damage to nail, initial encounter: Secondary | ICD-10-CM

## 2018-12-28 DIAGNOSIS — Z79899 Other long term (current) drug therapy: Secondary | ICD-10-CM | POA: Insufficient documentation

## 2018-12-28 DIAGNOSIS — Y92002 Bathroom of unspecified non-institutional (private) residence single-family (private) house as the place of occurrence of the external cause: Secondary | ICD-10-CM | POA: Insufficient documentation

## 2018-12-28 DIAGNOSIS — R531 Weakness: Secondary | ICD-10-CM

## 2018-12-28 DIAGNOSIS — R51 Headache: Secondary | ICD-10-CM | POA: Diagnosis not present

## 2018-12-28 DIAGNOSIS — J45909 Unspecified asthma, uncomplicated: Secondary | ICD-10-CM | POA: Insufficient documentation

## 2018-12-28 DIAGNOSIS — S0990XA Unspecified injury of head, initial encounter: Secondary | ICD-10-CM | POA: Diagnosis not present

## 2018-12-28 DIAGNOSIS — S8992XA Unspecified injury of left lower leg, initial encounter: Secondary | ICD-10-CM | POA: Diagnosis not present

## 2018-12-28 DIAGNOSIS — S99922A Unspecified injury of left foot, initial encounter: Secondary | ICD-10-CM | POA: Diagnosis not present

## 2018-12-28 DIAGNOSIS — Z91040 Latex allergy status: Secondary | ICD-10-CM | POA: Diagnosis not present

## 2018-12-28 DIAGNOSIS — R42 Dizziness and giddiness: Secondary | ICD-10-CM | POA: Diagnosis not present

## 2018-12-28 DIAGNOSIS — M25562 Pain in left knee: Secondary | ICD-10-CM | POA: Diagnosis not present

## 2018-12-28 DIAGNOSIS — I959 Hypotension, unspecified: Secondary | ICD-10-CM | POA: Diagnosis not present

## 2018-12-28 DIAGNOSIS — R52 Pain, unspecified: Secondary | ICD-10-CM | POA: Diagnosis not present

## 2018-12-28 DIAGNOSIS — R55 Syncope and collapse: Secondary | ICD-10-CM | POA: Diagnosis not present

## 2018-12-28 DIAGNOSIS — R0902 Hypoxemia: Secondary | ICD-10-CM | POA: Diagnosis not present

## 2018-12-28 LAB — CBC WITH DIFFERENTIAL/PLATELET
Abs Immature Granulocytes: 0.04 10*3/uL (ref 0.00–0.07)
Basophils Absolute: 0 10*3/uL (ref 0.0–0.1)
Basophils Relative: 0 %
Eosinophils Absolute: 0 10*3/uL (ref 0.0–0.5)
Eosinophils Relative: 0 %
HCT: 40.6 % (ref 36.0–46.0)
Hemoglobin: 13.3 g/dL (ref 12.0–15.0)
Immature Granulocytes: 0 %
Lymphocytes Relative: 20 %
Lymphs Abs: 2.1 10*3/uL (ref 0.7–4.0)
MCH: 28.5 pg (ref 26.0–34.0)
MCHC: 32.8 g/dL (ref 30.0–36.0)
MCV: 87.1 fL (ref 80.0–100.0)
Monocytes Absolute: 0.6 10*3/uL (ref 0.1–1.0)
Monocytes Relative: 6 %
Neutro Abs: 7.4 10*3/uL (ref 1.7–7.7)
Neutrophils Relative %: 74 %
Platelets: 294 10*3/uL (ref 150–400)
RBC: 4.66 MIL/uL (ref 3.87–5.11)
RDW: 14.6 % (ref 11.5–15.5)
WBC: 10.2 10*3/uL (ref 4.0–10.5)
nRBC: 0 % (ref 0.0–0.2)

## 2018-12-28 LAB — CBG MONITORING, ED: Glucose-Capillary: 81 mg/dL (ref 70–99)

## 2018-12-28 LAB — COMPREHENSIVE METABOLIC PANEL
ALT: 13 U/L (ref 0–44)
AST: 16 U/L (ref 15–41)
Albumin: 4.1 g/dL (ref 3.5–5.0)
Alkaline Phosphatase: 87 U/L (ref 38–126)
Anion gap: 8 (ref 5–15)
BUN: 9 mg/dL (ref 8–23)
CO2: 22 mmol/L (ref 22–32)
Calcium: 8.9 mg/dL (ref 8.9–10.3)
Chloride: 107 mmol/L (ref 98–111)
Creatinine, Ser: 0.91 mg/dL (ref 0.44–1.00)
GFR calc Af Amer: >60 mL/min (ref >60)
GFR calc non Af Amer: >60 mL/min (ref >60)
Glucose, Bld: 106 mg/dL — ABNORMAL HIGH (ref 70–99)
Potassium: 4.2 mmol/L (ref 3.5–5.1)
Sodium: 137 mmol/L (ref 135–145)
Total Bilirubin: 0.6 mg/dL (ref 0.3–1.2)
Total Protein: 7 g/dL (ref 6.5–8.1)

## 2018-12-28 LAB — URINALYSIS, ROUTINE W REFLEX MICROSCOPIC
Bilirubin Urine: NEGATIVE
Glucose, UA: NEGATIVE mg/dL
Hgb urine dipstick: NEGATIVE
Ketones, ur: NEGATIVE mg/dL
Nitrite: NEGATIVE
Protein, ur: NEGATIVE mg/dL
Specific Gravity, Urine: 1.005 (ref 1.005–1.030)
pH: 7 (ref 5.0–8.0)

## 2018-12-28 MED ORDER — LACTATED RINGERS IV BOLUS
1000.0000 mL | Freq: Once | INTRAVENOUS | Status: AC
  Administered 2018-12-28: 1000 mL via INTRAVENOUS

## 2018-12-28 MED ORDER — FENTANYL CITRATE (PF) 100 MCG/2ML IJ SOLN
50.0000 ug | Freq: Once | INTRAMUSCULAR | Status: AC
  Administered 2018-12-28: 50 ug via INTRAVENOUS
  Filled 2018-12-28: qty 2

## 2018-12-28 NOTE — ED Triage Notes (Signed)
Patient called EMS for fall that happened at 0500 today, denies any LOC, states she began having left forearm, left knee and left foot pain. Self extracted from floor. PTAR first on arrival and called EMS bc pt was hypotensive bp palpated 96. 102/68 was lowest BP with EMS. Pt then states she was unsure if she passes out. EKG normal with EMS. Pt also asked EMS for pain management for her foot pain.

## 2018-12-28 NOTE — ED Provider Notes (Signed)
[facility] DEPT Provider Note   CSN: [id number] Arrival date & time: 12/28/18  1004    History   Chief Complaint Chief Complaint  Patient presents with  . Fall    HPI Lori [name] is a 63 y.o. female.     HPI  63 year old female presents with fall and pain on the left side of her body.  Fell around 5 AM.  She was getting up to go to the bathroom and felt recurrent dizziness/lightheadedness.  This is happened to her many times before.  She fell on the way to the bathroom.  Has severe pain in her left forearm, left knee and left foot.  Could not get up on her own but eventually was helped up.  She is been feeling weak and fatigued for a week but no other symptoms including no fever, cough, shortness of breath, chest pain.  No recent contact with anyone with COVID-19.  No diarrhea or vomiting.  Pain is currently severe.  Past Medical History:  Diagnosis Date  . Acute MI ([place])    x3 - by report only. She has had 3 negative Myoview stress test with no evidence of prior infarct.  . Allergic rhinitis   . Allergy   . Anemia   . Ankle fracture 05/2016  . Anxiety   . Arthritis   . Asthma   . Barrett esophagus 2007  . Chest pain 05-02-2009   echo  EF 55%  . COPD (chronic obstructive pulmonary disease) with chronic bronchitis ([facility])    & Emphysema  . Depression   . Duodenitis without mention of hemorrhage 2007  . Esophageal reflux 2007  . Esophageal stricture   . Full dentures   . H/O hiatal hernia   . Hiatal hernia 3536,[id number]  . Hyperlipidemia   . Multiple fractures    from falls, fx rt. elbow, fx left wrist, bilateral ankles  . Pneumonia 10/2016  . Restrictive lung disease 06/30/2017  . Ulcer     Patient Active Problem List   Diagnosis Date Noted  . B12 deficiency 10/20/2018  . Nausea 04/27/2018  . Anemia, iron deficiency 04/27/2018  . Intermittent paresthesia of hand and foot 04/27/2018  . Headache 03/21/2018  . Lumbar radiculitis  08/15/2017  . Degenerative disc disease, lumbar 07/21/2017  . Encounter for well adult exam with abnormal findings 06/30/2017  . Epigastric pain 06/30/2017  . Restrictive lung disease 06/30/2017  . Leg cramping 06/09/2017  . Elbow contusion 06/09/2017  . Right elbow pain 05/27/2017  . Asthma-COPD overlap syndrome ([place]) 01/31/2017  . Dizziness 01/20/2017  . Seasonal and perennial allergic rhinitis 01/01/2017  . Moderate persistent asthma, uncomplicated 15/40/0867  . Pulmonary emphysema ([place]) 12/03/2016  . Tobacco use disorder 12/03/2016  . Chronic seasonal allergic rhinitis 12/03/2016  . Medication management 11/29/2016  . Cough 11/23/2016  . Rash 11/23/2016  . Vertigo 10/22/2016  . Gastroesophageal reflux disease with esophagitis 10/22/2016  . Non compliance w medication regimen 08/01/2016  . Allergic contact dermatitis due to metals 08/01/2016  . Migraine without aura and without status migrainosus, not intractable 07/22/2016  . Spasm of muscle of lower back 06/21/2016  . Chronic rhinitis 06/19/2016  . Gait difficulty 06/21/2015  . Fibromyalgia 06/21/2015  . Conversion reaction 06/21/2015  . Depression 06/21/2015  . Syncope and collapse 06/21/2015  . Plica syndrome of left knee 01/06/2015  . Anxiety state 09/06/2014  . History of MI 09/06/2014  . Cigarette smoker 06/27/2013  . Borderline hypertension 06/27/2013  .  Chronic low back pain with right-sided sciatica 06/14/2013  . Leg pain, bilateral 06/14/2013  . Gastroparesis 10/26/2009  . Musculoskeletal chest pain 05/02/2009  . Hyperlipidemia LDL goal <100 11/18/2008  . COPD exacerbation ([place]) 11/18/2008  . HEPATIC CYST 11/18/2008    Past Surgical History:  Procedure Laterality Date  . ABDOMINAL HYSTERECTOMY     BSO  . APPENDECTOMY    . CESAREAN SECTION     x 2  . CHOLECYSTECTOMY    . CHONDROPLASTY Left 01/06/2015   Procedure: CHONDROPLASTY;  Surgeon: Marybelle Killings, MD;  Location: [facility];  Service:  Orthopedics;  Laterality: Left;  . COLONOSCOPY    . ELBOW FRACTURE SURGERY     right  . KNEE ARTHROSCOPY WITH EXCISION PLICA Left 2/83/6629   Procedure: KNEE ARTHROSCOPY WITH EXCISION PLICA;  Surgeon: Marybelle Killings, MD;  Location: [facility];  Service: Orthopedics;  Laterality: Left;  . Lower Extremity Arterial Dopplers  07/06/2013   RABI 1.0, LABI 1.1.; No evidence of significant vascular atherogenic plaque  . NECK SURGERY    . NM MYOVIEW LTD  09/2014   LOW RISK. Normal EF of 65% with no regional wall motion abnormalities. No ischemia or infarction.  . TONSILLECTOMY    . WRIST FRACTURE SURGERY     left, has plate     OB History   No obstetric history on file.      Home Medications    Prior to Admission medications   Medication Sig Start Date End Date Taking? Authorizing Provider  ALPRAZolam Duanne Moron) 1 MG tablet 1 tab by mouth daily as needed Patient taking differently: Take 1 mg by mouth daily as needed for anxiety.  10/20/18   Biagio Borg, MD  baclofen (LIORESAL) 10 MG tablet Take 10 mg by mouth 2 (two) times daily as needed for muscle spasms. Limit 1-2 treatments per week. 11/19/18   [provider]  celecoxib (CELEBREX) 200 MG capsule Take 200 mg by mouth daily.    [provider]  citalopram (CELEXA) 20 MG tablet Take 1 tablet (20 mg total) by mouth daily. 10/20/18   Biagio Borg, MD  cyclobenzaprine (FLEXERIL) 5 MG tablet TAKE 1 TABLET(5 MG) BY MOUTH THREE TIMES DAILY AS NEEDED FOR MUSCLE SPASMS 12/17/18   Biagio Borg, MD  DEXILANT 60 MG capsule TAKE 1 CAPSULE(60 MG) BY MOUTH DAILY 09/07/18   Biagio Borg, MD  EPINEPHrine 0.3 mg/0.3 mL IJ SOAJ injection INJECT AS DIRECTED FOR SEVERE ALLERGIC REACTION 01/23/18   Valentina Shaggy, MD  gabapentin (NEURONTIN) 100 MG capsule Take 1 capsule (100 mg total) by mouth 3 (three) times daily. 10/20/18   Biagio Borg, MD  levETIRAcetam (KEPPRA) 500 MG tablet Take 500 mg by mouth 2 (two) times daily. 11/19/18    [provider]  montelukast (SINGULAIR) 10 MG tablet Take 1 tablet (10 mg total) by mouth at bedtime. 10/20/18   Biagio Borg, MD  nitroGLYCERIN (NITROSTAT) 0.4 MG SL tablet PLACE 1 TABLET UNDER THE TONGUE EVERY 5 MINUTES AS NEEDED FOR CHEST PAIN. 08/12/18   Leonie Man, MD  pregabalin (LYRICA) 75 MG capsule Take 75 mg by mouth 2 (two) times daily. 10/27/18   [provider]  PROAIR HFA 108 (90 Base) MCG/ACT inhaler Inhale 1-2 puffs into the lungs every 4 (four) hours as needed for shortness of breath or wheezing. 04/24/18   Valentina Shaggy, MD  promethazine-dextromethorphan (PROMETHAZINE-DM) 6.25-15 MG/5ML syrup Take 5 mLs  by mouth 4 (four) times daily as needed for cough. 10/20/18   Biagio Borg, MD  rosuvastatin (CRESTOR) 20 MG tablet Take 1 tablet (20 mg total) by mouth daily. 10/20/18   Biagio Borg, MD  valACYclovir (VALTREX) 500 MG tablet TAKE 1 TABLET(500 MG) BY MOUTH DAILY 10/20/18   Biagio Borg, MD    Family History Family History  Problem Relation Age of Onset  . Emphysema Father   . Dementia Mother   . Diabetes Maternal Grandmother   . Diabetes Maternal Aunt   . Diabetes Other        mat. cousin  . Heart disease Brother   . Colon cancer Neg Hx   . Allergic rhinitis Neg Hx   . Angioedema Neg Hx   . Asthma Neg Hx   . Atopy Neg Hx   . Eczema Neg Hx   . Immunodeficiency Neg Hx   . Urticaria Neg Hx     Social History Social History   Tobacco Use  . Smoking status: Former Smoker    Packs/day: 1.00    Years: 39.00    Pack years: 39.00    Types: Cigarettes    Start date: 08/06/1977  . Smokeless tobacco: Never Used  . Tobacco comment: stopped 10/22.   Substance Use Topics  . Alcohol use: No    Alcohol/week: 0.0 standard drinks  . Drug use: No     Allergies   Bee venom; Codeine; Ibuprofen; Clonidine derivatives; Tramadol; Hydrocodone-acetaminophen; Latex; Levofloxacin; and Propoxyphene n-acetaminophen   Review of Systems Review of  Systems  Constitutional: Negative for fever.  Respiratory: Negative for cough and shortness of breath.   Cardiovascular: Negative for chest pain.  Gastrointestinal: Negative for abdominal pain, blood in stool and vomiting.  Genitourinary: Positive for frequency (chronic). Negative for dysuria.  Musculoskeletal: Positive for arthralgias and neck pain.  Neurological: Positive for dizziness, weakness, light-headedness and headaches. Negative for syncope.  All other systems reviewed and are negative.    Physical Exam Updated Vital Signs BP 121/68 (BP Location: Left Arm)   Pulse 82   Temp 97.9 F (36.6 C) (Oral)   Resp 18   SpO2 97%   Physical Exam Vitals signs and nursing note reviewed.  Constitutional:      Appearance: She is well-developed. She is not ill-appearing or diaphoretic.     Interventions: Cervical collar in place.  HENT:     Head: Normocephalic and atraumatic.     Right Ear: External ear normal.     Left Ear: External ear normal.     Nose: Nose normal.  Eyes:     General:        Right eye: No discharge.        Left eye: No discharge.  Neck:     Musculoskeletal: Muscular tenderness present.  Cardiovascular:     Rate and Rhythm: Normal rate and regular rhythm.     Heart sounds: Normal heart sounds.  Pulmonary:     Effort: Pulmonary effort is normal.     Breath sounds: Normal breath sounds.  Abdominal:     General: There is no distension.     Palpations: Abdomen is soft.     Tenderness: There is no abdominal tenderness.  Musculoskeletal:     Left elbow: She exhibits normal range of motion. No tenderness found.     Left hip: She exhibits no tenderness.     Left knee: She exhibits normal range of motion, no swelling and no effusion. Tenderness  found.     Left ankle: She exhibits no swelling. No tenderness.     Cervical back: She exhibits tenderness.     Left forearm: She exhibits tenderness (diffuse).       Arms:     Left upper leg: She exhibits no  tenderness.     Left lower leg: She exhibits no tenderness.     Left foot: Tenderness (diffuse) present.       Feet:  Skin:    General: Skin is warm and dry.  Neurological:     Mental Status: She is alert.     Comments: CN 3-12 grossly intact. 5/5 strength in all 4 extremities. Grossly normal sensation. Normal finger to nose.   Psychiatric:        Mood and Affect: Mood is not anxious.      ED Treatments / Results  Labs (all labs ordered are listed, but only abnormal results are displayed) Labs Reviewed  COMPREHENSIVE METABOLIC PANEL - Abnormal; Notable for the following components:      Result Value   Glucose, Bld 106 (*)    All other components within normal limits  URINALYSIS, ROUTINE W REFLEX MICROSCOPIC - Abnormal; Notable for the following components:   Leukocytes,Ua TRACE (*)    Bacteria, UA RARE (*)    All other components within normal limits  CBC WITH DIFFERENTIAL/PLATELET  CBG MONITORING, ED    EKG EKG Interpretation  Date/Time:  Monday December 28 2018 10:45:19 EDT Ventricular Rate:  83 PR Interval:    QRS Duration: 76 QT Interval:  370 QTC Calculation: 435 R Axis:   60 Text Interpretation:  Sinus rhythm Low voltage, precordial leads Abnormal R-wave progression, early transition no significant change since April 2019 Confirmed by Sherwood Gambler [phone]) on 12/28/2018 11:22:17 AM   Radiology Dg Forearm Left  Result Date: 12/28/2018 CLINICAL DATA:  Post fall, now with abrasion to the posterior aspect of the forearm. EXAM: LEFT FOREARM - 2 VIEW COMPARISON:  09/09/2013 FINDINGS: Post sideplate fixation of the distal radius without definite evidence of hardware failure loosening. No definite fracture or dislocation. No definite elbow joint effusion. Regional soft tissues appear normal with special attention paid to the posterior aspect of the forearm. No radiopaque foreign body. IMPRESSION: 1. No acute findings. 2. Post sideplate fixation of the distal radius without  evidence of hardware failure or loosening. Electronically Signed   By: Sandi Mariscal M.D.   On: 12/28/2018 11:24   Ct Head Wo Contrast  Result Date: 12/28/2018 CLINICAL DATA:  Fall at 5 a.m. today. Left-sided pain. Hypotension. Head trauma, minor. EXAM: CT HEAD WITHOUT CONTRAST CT CERVICAL SPINE WITHOUT CONTRAST TECHNIQUE: Multidetector CT imaging of the head and cervical spine was performed following the standard protocol without intravenous contrast. Multiplanar CT image reconstructions of the cervical spine were also generated. COMPARISON:  MRI brain 10/21/2018 FINDINGS: CT HEAD FINDINGS Brain: Atrophy and white matter changes are stable. No acute infarct, hemorrhage, or mass lesion is present. The ventricles are of normal size. No significant extraaxial fluid collection is present. The brainstem and cerebellum are within normal limits. Vascular: No hyperdense vessel or unexpected calcification. Skull: No significant extracranial soft tissue injury is present. Calvarium is intact. No focal lytic or blastic lesions are present. Sinuses/Orbits: The paranasal sinuses and mastoid air cells are clear. The globes and orbits are within normal limits. Other: An unerupted tooth is evident in the left maxilla. CT CERVICAL SPINE FINDINGS Alignment: Grade 1 anterolisthesis is present at C7-T1. AP alignment  is otherwise anatomic. Skull base and vertebrae: Craniocervical junction is within normal limits. Vertebral body heights are maintained. No acute or healing fractures are present. Soft tissues and spinal canal: Soft tissues the neck are unremarkable. No spinal canal hematoma is evident. Disc levels: Solid fusion is present C3-7. Multilevel degenerative changes are noted. Facet disease is greatest at C7-T1. Upper chest: Lung apices are clear. Thoracic inlet is within normal limits. There is some scarring at the lung apices. IMPRESSION: 1. No acute trauma to the head or cervical spine. 2. Stable atrophy and white matter  disease. 3. Postsurgical and degenerative changes of the cervical spine as described. Electronically Signed   By: San Morelle M.D.   On: 12/28/2018 11:58   Ct Cervical Spine Wo Contrast  Result Date: 12/28/2018 CLINICAL DATA:  Fall at 5 a.m. today. Left-sided pain. Hypotension. Head trauma, minor. EXAM: CT HEAD WITHOUT CONTRAST CT CERVICAL SPINE WITHOUT CONTRAST TECHNIQUE: Multidetector CT imaging of the head and cervical spine was performed following the standard protocol without intravenous contrast. Multiplanar CT image reconstructions of the cervical spine were also generated. COMPARISON:  MRI brain 10/21/2018 FINDINGS: CT HEAD FINDINGS Brain: Atrophy and white matter changes are stable. No acute infarct, hemorrhage, or mass lesion is present. The ventricles are of normal size. No significant extraaxial fluid collection is present. The brainstem and cerebellum are within normal limits. Vascular: No hyperdense vessel or unexpected calcification. Skull: No significant extracranial soft tissue injury is present. Calvarium is intact. No focal lytic or blastic lesions are present. Sinuses/Orbits: The paranasal sinuses and mastoid air cells are clear. The globes and orbits are within normal limits. Other: An unerupted tooth is evident in the left maxilla. CT CERVICAL SPINE FINDINGS Alignment: Grade 1 anterolisthesis is present at C7-T1. AP alignment is otherwise anatomic. Skull base and vertebrae: Craniocervical junction is within normal limits. Vertebral body heights are maintained. No acute or healing fractures are present. Soft tissues and spinal canal: Soft tissues the neck are unremarkable. No spinal canal hematoma is evident. Disc levels: Solid fusion is present C3-7. Multilevel degenerative changes are noted. Facet disease is greatest at C7-T1. Upper chest: Lung apices are clear. Thoracic inlet is within normal limits. There is some scarring at the lung apices. IMPRESSION: 1. No acute trauma to the  head or cervical spine. 2. Stable atrophy and white matter disease. 3. Postsurgical and degenerative changes of the cervical spine as described. Electronically Signed   By: San Morelle M.D.   On: 12/28/2018 11:58   Dg Knee Complete 4 Views Left  Result Date: 12/28/2018 CLINICAL DATA:  Post fall, now with left anterior knee pain. EXAM: LEFT KNEE - COMPLETE 4+ VIEW COMPARISON:  None. FINDINGS: No fracture or dislocation. No definite knee joint effusion. Mild degenerative change involving the patellofemoral joint with articular surface irregularity, subchondral sclerosis and osteophytosis. No evidence of chondrocalcinosis. Regional soft tissues appear normal. No radiopaque foreign body. IMPRESSION: 1. No acute findings. 2. Mild degenerative change of the patellofemoral joint. Electronically Signed   By: Sandi Mariscal M.D.   On: 12/28/2018 11:25   Dg Foot Complete Left  Result Date: 12/28/2018 CLINICAL DATA:  Post fall, now with pain involving the mid dorsal aspect of the foot. EXAM: LEFT FOOT - COMPLETE 3+ VIEW COMPARISON:  None. FINDINGS: No fracture or dislocation. Very mild hallux valgus deformity. Joint spaces are preserved. No erosions. No plantar calcaneal spur. Regional soft tissues appear normal. No radiopaque foreign body. IMPRESSION: No explanation for patient's left foot pain.  Electronically Signed   By: Sandi Mariscal M.D.   On: 12/28/2018 11:26    Procedures Procedures (including critical care time)  Medications Ordered in ED Medications  lactated ringers bolus 1,000 mL ( Intravenous Stopped 12/28/18 1244)  fentaNYL (SUBLIMAZE) injection 50 mcg (50 mcg Intravenous Given 12/28/18 1042)     Initial Impression / Assessment and Plan / ED Course  I have reviewed the triage vital signs and the nursing notes.  Pertinent labs & imaging results that were available during my care of the patient were reviewed by me and considered in my medical decision making (see chart for details).         Patient had some borderline low blood pressure but no hypotension in the ED.  With fluids this is come up.  No fracture seen on x-rays and her CT head and C-spine are benign.  C-collar removed.  This is likely muscular injury.  Given her great toe contusion with pain, this was buddy taped and she will be placed in a hard sole shoe.  Otherwise, her generalized weakness is of unclear origin but no overt or concerning findings and she appears stable for follow-up with her PCP and discharged home.  Final Clinical Impressions(s) / ED Diagnoses   Final diagnoses:  Fall, initial encounter  Contusion of left great toe without damage to nail, initial encounter  Abrasion of left forearm, initial encounter  General weakness    ED Discharge Orders    None       Sherwood Gambler, MD 12/28/18 1539

## 2018-12-28 NOTE — ED Notes (Signed)
Bed: WA13 Expected date:  Expected time:  Means of arrival:  Comments: EMS-fall 

## 2018-12-28 NOTE — ED Notes (Signed)
Pt in xray

## 2018-12-29 ENCOUNTER — Telehealth: Payer: Self-pay

## 2018-12-29 ENCOUNTER — Telehealth: Payer: Self-pay | Admitting: Internal Medicine

## 2018-12-29 ENCOUNTER — Telehealth (INDEPENDENT_AMBULATORY_CARE_PROVIDER_SITE_OTHER): Payer: Medicare Other | Admitting: Internal Medicine

## 2018-12-29 DIAGNOSIS — R42 Dizziness and giddiness: Secondary | ICD-10-CM

## 2018-12-29 DIAGNOSIS — S161XXA Strain of muscle, fascia and tendon at neck level, initial encounter: Secondary | ICD-10-CM

## 2018-12-29 DIAGNOSIS — S90112A Contusion of left great toe without damage to nail, initial encounter: Secondary | ICD-10-CM | POA: Diagnosis not present

## 2018-12-29 DIAGNOSIS — W19XXXA Unspecified fall, initial encounter: Secondary | ICD-10-CM | POA: Diagnosis not present

## 2018-12-29 DIAGNOSIS — R52 Pain, unspecified: Secondary | ICD-10-CM | POA: Insufficient documentation

## 2018-12-29 MED ORDER — TRAMADOL HCL 50 MG PO TABS: 50.0000 mg | ORAL_TABLET | Freq: Four times a day (QID) | ORAL | 0 refills | Status: DC | PRN

## 2018-12-29 MED ORDER — MECLIZINE HCL 12.5 MG PO TABS: 12.5000 mg | ORAL_TABLET | Freq: Three times a day (TID) | ORAL | 1 refills | Status: DC | PRN

## 2018-12-29 NOTE — Telephone Encounter (Signed)
Pt is scheduled for a Phone visit today at 2pm for a recent fall.

## 2018-12-29 NOTE — Telephone Encounter (Signed)
LVM for patient to call back and make an appointment.

## 2018-12-29 NOTE — Telephone Encounter (Signed)
Ok to set up phone visit. 

## 2018-12-29 NOTE — Telephone Encounter (Signed)
Dr. Jenny Reichmann please advise. Is a phone visit ok?   Copied from [place] [id number]. Topic: General - Other >> Dec 28, 2018  4:28 PM Lori [name], [place] C wrote: Reason for CRM: pt called in to schedule ED follow up.   Please assist.   CB: [phone] >> Dec 28, 2018  4:46 PM Para Skeans A wrote: Patient is unable to do a virtual visit, she has no smartphone or computer, please advise.

## 2018-12-29 NOTE — Telephone Encounter (Signed)
Cumulative time during 7-day interval 14 min, there was not an associated office visit for this concern within a 7 day period.  Verbal consent for services obtained from patient prior to services given.  Names of all persons present for services: Cathlean Cower, MD, patient  Chief complaint: fall with pain worse today  History,  63yo F with generalized weakness and recurrent falls and hx of low normal BP, fell again yesterday with getting up to BR with dizziness improved with meclizine, asks for refill.  Today c/o worsening overall pain and myalgias without fever, but with stiffness and even slower movements than usual.  Did have left great toe contusion and left arm abrasion, as well as neck strain.  CT head and neck neg for acute.  Pt denies chest pain, increased sob or doe, wheezing, orthopnea, PND, increased LE swelling, palpitations, dizziness or syncope.  Pt denies new neurological symptoms such as new headache, or facial or extremity weakness or numbness   Pt denies polydipsia, polyuria  No other new complaints Past Medical History:  Diagnosis Date  . Acute MI ([place])    x3 - by report only. She has had 3 negative Myoview stress test with no evidence of prior infarct.  . Allergic rhinitis   . Allergy   . Anemia   . Ankle fracture 05/2016  . Anxiety   . Arthritis   . Asthma   . Barrett esophagus 2007  . Chest pain 05-02-2009   echo  EF 55%  . COPD (chronic obstructive pulmonary disease) with chronic bronchitis (HCC)    & Emphysema  . Depression   . Duodenitis without mention of hemorrhage 2007  . Esophageal reflux 2007  . Esophageal stricture   . Full dentures   . H/O hiatal hernia   . Hiatal hernia [id number]  . Hyperlipidemia   . Multiple fractures    from falls, fx rt. elbow, fx left wrist, bilateral ankles  . Pneumonia 10/2016  . Restrictive lung disease 06/30/2017  . Ulcer     Current Outpatient Medications on File Prior to Visit  Medication Sig Dispense Refill  .  ALPRAZolam (XANAX) 1 MG tablet 1 tab by mouth daily as needed (Patient taking differently: Take 1 mg by mouth daily as needed for anxiety. ) 30 tablet 5  . baclofen (LIORESAL) 10 MG tablet Take 10 mg by mouth 2 (two) times daily as needed for muscle spasms. Limit 1-2 treatments per week.    . celecoxib (CELEBREX) 200 MG capsule Take 200 mg by mouth daily.    . citalopram (CELEXA) 20 MG tablet Take 1 tablet (20 mg total) by mouth daily. 90 tablet 3  . cyclobenzaprine (FLEXERIL) 5 MG tablet TAKE 1 TABLET(5 MG) BY MOUTH THREE TIMES DAILY AS NEEDED FOR MUSCLE SPASMS 60 tablet 1  . DEXILANT 60 MG capsule TAKE 1 CAPSULE(60 MG) BY MOUTH DAILY 30 capsule 5  . EPINEPHrine 0.3 mg/0.3 mL IJ SOAJ injection INJECT AS DIRECTED FOR SEVERE ALLERGIC REACTION 2 Device 2  . gabapentin (NEURONTIN) 100 MG capsule Take 1 capsule (100 mg total) by mouth 3 (three) times daily. 270 capsule 1  . levETIRAcetam (KEPPRA) 500 MG tablet Take 500 mg by mouth 2 (two) times daily.    . montelukast (SINGULAIR) 10 MG tablet Take 1 tablet (10 mg total) by mouth at bedtime. 90 tablet 3  . nitroGLYCERIN (NITROSTAT) 0.4 MG SL tablet PLACE 1 TABLET UNDER THE TONGUE EVERY 5 MINUTES AS NEEDED FOR CHEST PAIN. 25 tablet 1  .  pregabalin (LYRICA) 75 MG capsule Take 75 mg by mouth 2 (two) times daily.    Marland Kitchen PROAIR HFA 108 (90 Base) MCG/ACT inhaler Inhale 1-2 puffs into the lungs every 4 (four) hours as needed for shortness of breath or wheezing. 1 Inhaler 1  . promethazine-dextromethorphan (PROMETHAZINE-DM) 6.25-15 MG/5ML syrup Take 5 mLs by mouth 4 (four) times daily as needed for cough. 118 mL 1  . rosuvastatin (CRESTOR) 20 MG tablet Take 1 tablet (20 mg total) by mouth daily. 90 tablet 3  . valACYclovir (VALTREX) 500 MG tablet TAKE 1 TABLET(500 MG) BY MOUTH DAILY 30 tablet 5   Current Facility-Administered Medications on File Prior to Visit  Medication Dose Route Frequency Provider Last Rate Last Dose  . Benralizumab SOSY 30 mg  30 mg  Subcutaneous Q8 Toney Reil, MD   30 mg at 11/30/18 0941  . methylPREDNISolone acetate (DEPO-MEDROL) injection 40 mg  40 mg Other Once Magnus Sinning, MD       Lab Results  Component Value Date   WBC 10.2 12/28/2018   HGB 13.3 12/28/2018   HCT 40.6 12/28/2018   PLT 294 12/28/2018   GLUCOSE 106 (H) 12/28/2018   CHOL 282 (H) 10/20/2018   TRIG 332.0 (H) 10/20/2018   HDL 42.50 10/20/2018   LDLDIRECT 201.0 10/20/2018   LDLCALC Comment 03/12/2017   ALT 13 12/28/2018   AST 16 12/28/2018   NA 137 12/28/2018   K 4.2 12/28/2018   CL 107 12/28/2018   CREATININE 0.91 12/28/2018   BUN 9 12/28/2018   CO2 22 12/28/2018   TSH 2.41 10/20/2018   INR 0.9 08/31/2008   HGBA1C 5.5 06/21/2016   A0/P/next steps:       Multiple contusions, abrasion and neck strain - for tramadol prn (willing to retry even though she had some nausea previously), continue flexeril  Dizziness - for meclizine refill  Cathlean Cower

## 2018-12-30 ENCOUNTER — Telehealth: Payer: Self-pay

## 2018-12-30 MED ORDER — HYDROCODONE-ACETAMINOPHEN 5-325 MG PO TABS: 1.0000 | ORAL_TABLET | Freq: Four times a day (QID) | ORAL | 0 refills | Status: DC | PRN

## 2018-12-30 NOTE — Telephone Encounter (Signed)
Copied from [place] [id number]. Topic: Appointment Scheduling - Scheduling Inquiry for Clinic >> Dec 29, 2018  5:36 PM Valla Leaver wrote: Reason for CRM: Patient wants to know if b12 this Friday is still happening?  b12 appt for Friday is cancelled for now, we will call patient back to reschedule

## 2018-12-30 NOTE — Telephone Encounter (Signed)
Ok, it was a good try  OK for limited rx of hydrocodone - done erx

## 2018-12-30 NOTE — Telephone Encounter (Signed)
Routing to dr john----pt states tramadol you sent in for her is making her sick and not really working well---she's already tried taking with food and it still makes her sick---can you please call in something else, please advise, thanks

## 2018-12-30 NOTE — Addendum Note (Signed)
Addended by: Biagio Borg on: 12/30/2018 11:23 AM   Modules accepted: Orders

## 2018-12-30 NOTE — Telephone Encounter (Signed)
Patient advised of dr johns note 

## 2019-01-01 ENCOUNTER — Ambulatory Visit: Payer: Medicare Other

## 2019-01-06 DIAGNOSIS — G43719 Chronic migraine without aura, intractable, without status migrainosus: Secondary | ICD-10-CM | POA: Diagnosis not present

## 2019-01-06 DIAGNOSIS — M791 Myalgia, unspecified site: Secondary | ICD-10-CM | POA: Diagnosis not present

## 2019-01-06 DIAGNOSIS — M542 Cervicalgia: Secondary | ICD-10-CM | POA: Diagnosis not present

## 2019-01-15 ENCOUNTER — Other Ambulatory Visit: Payer: Self-pay

## 2019-01-15 ENCOUNTER — Encounter: Payer: Self-pay | Admitting: Internal Medicine

## 2019-01-15 ENCOUNTER — Ambulatory Visit (INDEPENDENT_AMBULATORY_CARE_PROVIDER_SITE_OTHER): Payer: Medicare Other | Admitting: Internal Medicine

## 2019-01-15 VITALS — BP 120/72 | HR 95 | Temp 98.6°F | Ht 62.6 in | Wt 127.0 lb

## 2019-01-15 DIAGNOSIS — J4489 Other specified chronic obstructive pulmonary disease: Secondary | ICD-10-CM

## 2019-01-15 DIAGNOSIS — E538 Deficiency of other specified B group vitamins: Secondary | ICD-10-CM | POA: Diagnosis not present

## 2019-01-15 DIAGNOSIS — R52 Pain, unspecified: Secondary | ICD-10-CM

## 2019-01-15 DIAGNOSIS — R030 Elevated blood-pressure reading, without diagnosis of hypertension: Secondary | ICD-10-CM

## 2019-01-15 DIAGNOSIS — J449 Chronic obstructive pulmonary disease, unspecified: Secondary | ICD-10-CM | POA: Diagnosis not present

## 2019-01-15 MED ORDER — CYANOCOBALAMIN 1000 MCG/ML IJ SOLN
1000.0000 ug | Freq: Once | INTRAMUSCULAR | Status: AC
  Administered 2019-01-15: 1000 ug via INTRAMUSCULAR

## 2019-01-15 MED ORDER — HYDROCODONE-ACETAMINOPHEN 5-325 MG PO TABS: 1.0000 | ORAL_TABLET | Freq: Four times a day (QID) | ORAL | 0 refills | Status: DC | PRN

## 2019-01-15 NOTE — Assessment & Plan Note (Signed)
Acute s/p fall, ok for vicodin refill, cont nsaid prn, should not need vicodin long term,  to f/u any worsening symptoms or concerns

## 2019-01-15 NOTE — Patient Instructions (Addendum)
You had the B12 shot today  Please continue all other medications as before, and refills have been done if requested - the pain medication  Please have the pharmacy call with any other refills you may need.  Please continue your efforts at being more active, low cholesterol diet, and weight control  Please keep your appointments with your specialists as you may have planned  Please return in 3 months, or sooner if needed

## 2019-01-15 NOTE — Assessment & Plan Note (Signed)
stable overall by history and exam, recent data reviewed with pt, and pt to continue medical treatment as before,  to f/u any worsening symptoms or concerns  

## 2019-01-15 NOTE — Progress Notes (Signed)
Subjective:    Patient ID: Lori [name], female    DOB: 1956/03/10, 63 y.o.   MRN: [id number]  HPI  Here after seen apr 13 in ED after fall to left side, with hx of recurrent falls, has chronic LBP, has seen [place] ortho Dr newton with ESI x 3 to lower back, did not seem to help, advised to try to lose a few lbs, still with intermittent weakness at times and keeps falling. MRI LS Spine dec 2019 - IMPRESSION: No change in mild degenerative disease of the lumbar spine at L2-3 and L4-5. The central canal and foramina are widely patent at all Levels.  Also CT cervical spine/brain apr 13 - IMPRESSION: 1. No acute trauma to the head or cervical spine. 2. Stable atrophy and white matter disease. 3. Postsurgical and degenerative changes of the cervical spine as Described. Pt denies chest pain, increased sob or doe, wheezing, orthopnea, PND, increased LE swelling, palpitations, dizziness or syncope. Pt denies new neurological symptoms such as new headache, or facial or extremity weakness or numbness except for the above.   Pt denies polydipsia, polyuria Biggest problem is persistent pain and swelling to left foot and ankle after fall.  Left foot film neg for acute apr 13.  Left knee film with mild DJD.apr 13, no acute Past Medical History:  Diagnosis Date  . Acute MI ([place])    x3 - by report only. She has had 3 negative Myoview stress test with no evidence of prior infarct.  . Allergic rhinitis   . Allergy   . Anemia   . Ankle fracture 05/2016  . Anxiety   . Arthritis   . Asthma   . Barrett esophagus 2007  . Chest pain 05-02-2009   echo  EF 55%  . COPD (chronic obstructive pulmonary disease) with chronic bronchitis (HCC)    & Emphysema  . Depression   . Duodenitis without mention of hemorrhage 2007  . Esophageal reflux 2007  . Esophageal stricture   . Full dentures   . H/O hiatal hernia   . Hiatal hernia [id number]  . Hyperlipidemia   . Multiple fractures    from falls, fx rt.  elbow, fx left wrist, bilateral ankles  . Pneumonia 10/2016  . Restrictive lung disease 06/30/2017  . Ulcer    Past Surgical History:  Procedure Laterality Date  . ABDOMINAL HYSTERECTOMY     BSO  . APPENDECTOMY    . CESAREAN SECTION     x 2  . CHOLECYSTECTOMY    . CHONDROPLASTY Left 01/06/2015   Procedure: CHONDROPLASTY;  Surgeon: Marybelle Killings, MD;  Location: [facility];  Service: Orthopedics;  Laterality: Left;  . COLONOSCOPY    . ELBOW FRACTURE SURGERY     right  . KNEE ARTHROSCOPY WITH EXCISION PLICA Left 5/00/9381   Procedure: KNEE ARTHROSCOPY WITH EXCISION PLICA;  Surgeon: Marybelle Killings, MD;  Location: [facility];  Service: Orthopedics;  Laterality: Left;  . Lower Extremity Arterial Dopplers  07/06/2013   RABI 1.0, LABI 1.1.; No evidence of significant vascular atherogenic plaque  . NECK SURGERY    . NM MYOVIEW LTD  09/2014   LOW RISK. Normal EF of 65% with no regional wall motion abnormalities. No ischemia or infarction.  . TONSILLECTOMY    . WRIST FRACTURE SURGERY     left, has plate    reports that she has quit smoking. Her smoking use included cigarettes. She started smoking about 41 years ago.  She has a 39.00 pack-year smoking history. She has never used smokeless tobacco. She reports that she does not drink alcohol or use drugs. family history includes Dementia in her mother; Diabetes in her maternal aunt, maternal grandmother, and another family member; Emphysema in her father; Heart disease in her brother. Allergies  Allergen Reactions  . Bee Venom Swelling  . Codeine Nausea Only and Other (See Comments)    CAUSES ULCERS  . Ibuprofen Nausea And Vomiting  . Clonidine Derivatives Other (See Comments)    Unknown   . Tramadol Nausea Only  . Latex Rash  . Levofloxacin Nausea Only  . Propoxyphene N-Acetaminophen Nausea Only   Current Outpatient Medications on File Prior to Visit  Medication Sig Dispense Refill  . ALPRAZolam (XANAX) 1  MG tablet 1 tab by mouth daily as needed (Patient taking differently: Take 1 mg by mouth daily as needed for anxiety. ) 30 tablet 5  . baclofen (LIORESAL) 10 MG tablet Take 10 mg by mouth 2 (two) times daily as needed for muscle spasms. Limit 1-2 treatments per week.    . celecoxib (CELEBREX) 200 MG capsule Take 200 mg by mouth daily.    . citalopram (CELEXA) 20 MG tablet Take 1 tablet (20 mg total) by mouth daily. 90 tablet 3  . cyclobenzaprine (FLEXERIL) 5 MG tablet TAKE 1 TABLET(5 MG) BY MOUTH THREE TIMES DAILY AS NEEDED FOR MUSCLE SPASMS 60 tablet 1  . DEXILANT 60 MG capsule TAKE 1 CAPSULE(60 MG) BY MOUTH DAILY 30 capsule 5  . EPINEPHrine 0.3 mg/0.3 mL IJ SOAJ injection INJECT AS DIRECTED FOR SEVERE ALLERGIC REACTION 2 Device 2  . gabapentin (NEURONTIN) 100 MG capsule Take 1 capsule (100 mg total) by mouth 3 (three) times daily. 270 capsule 1  . levETIRAcetam (KEPPRA) 500 MG tablet Take 500 mg by mouth 2 (two) times daily.    . meclizine (ANTIVERT) 12.5 MG tablet Take 1 tablet (12.5 mg total) by mouth 3 (three) times daily as needed for dizziness. 40 tablet 1  . montelukast (SINGULAIR) 10 MG tablet Take 1 tablet (10 mg total) by mouth at bedtime. 90 tablet 3  . nitroGLYCERIN (NITROSTAT) 0.4 MG SL tablet PLACE 1 TABLET UNDER THE TONGUE EVERY 5 MINUTES AS NEEDED FOR CHEST PAIN. 25 tablet 1  . pregabalin (LYRICA) 75 MG capsule Take 75 mg by mouth 2 (two) times daily.    Marland Kitchen PROAIR HFA 108 (90 Base) MCG/ACT inhaler Inhale 1-2 puffs into the lungs every 4 (four) hours as needed for shortness of breath or wheezing. 1 Inhaler 1  . promethazine-dextromethorphan (PROMETHAZINE-DM) 6.25-15 MG/5ML syrup Take 5 mLs by mouth 4 (four) times daily as needed for cough. 118 mL 1  . rosuvastatin (CRESTOR) 20 MG tablet Take 1 tablet (20 mg total) by mouth daily. 90 tablet 3  . valACYclovir (VALTREX) 500 MG tablet TAKE 1 TABLET(500 MG) BY MOUTH DAILY 30 tablet 5   Current Facility-Administered Medications on File  Prior to Visit  Medication Dose Route Frequency Provider Last Rate Last Dose  . Benralizumab SOSY 30 mg  30 mg Subcutaneous Q8 Toney Reil, MD   30 mg at 11/30/18 0941  . methylPREDNISolone acetate (DEPO-MEDROL) injection 40 mg  40 mg Other Once Magnus Sinning, MD       Review of Systems  Constitutional: Negative for other unusual diaphoresis or sweats HENT: Negative for ear discharge or swelling Eyes: Negative for other worsening visual disturbances Respiratory: Negative for stridor or other swelling  Gastrointestinal: Negative  for worsening distension or other blood Genitourinary: Negative for retention or other urinary change Musculoskeletal: Negative for other MSK pain or swelling Skin: Negative for color change or other new lesions Neurological: Negative for worsening tremors and other numbness  Psychiatric/Behavioral: Negative for worsening agitation or other fatigue All other system neg per pt    Objective:   Physical Exam BP 120/72   Pulse 95   Temp 98.6 F (37 C) (Oral)   Ht 5' 2.6" (1.59 m)   Wt 127 lb (57.6 kg)   SpO2 98%   BMI 22.79 kg/m  VS noted,  Constitutional: Pt appears in NAD HENT: Head: NCAT.  Right Ear: External ear normal.  Left Ear: External ear normal.  Eyes: . Pupils are equal, round, and reactive to light. Conjunctivae and EOM are normal Nose: without d/c or deformity Neck: Neck supple. Gross normal ROM Cardiovascular: Normal rate and regular rhythm.   Pulmonary/Chest: Effort normal and breath sounds without rales or wheezing.  Abd:  Soft, NT, ND, + BS, no organomegaly Has diffuse tender  Midline and bilat lumbar paravertebral areas Neurological: Pt is alert. At baseline orientation, motor grossly intact Skin: Skin is warm. No rashes, other new lesions, no LE edema Psychiatric: Pt behavior is normal without agitation , mild nervous No other exam findings  Lab Results  Component Value Date   WBC 10.2 12/28/2018   HGB 13.3  12/28/2018   HCT 40.6 12/28/2018   PLT 294 12/28/2018   GLUCOSE 106 (H) 12/28/2018   CHOL 282 (H) 10/20/2018   TRIG 332.0 (H) 10/20/2018   HDL 42.50 10/20/2018   LDLDIRECT 201.0 10/20/2018   LDLCALC Comment 03/12/2017   ALT 13 12/28/2018   AST 16 12/28/2018   NA 137 12/28/2018   K 4.2 12/28/2018   CL 107 12/28/2018   CREATININE 0.91 12/28/2018   BUN 9 12/28/2018   CO2 22 12/28/2018   TSH 2.41 10/20/2018   INR 0.9 08/31/2008   HGBA1C 5.5 06/21/2016       Assessment & Plan:

## 2019-01-15 NOTE — Addendum Note (Signed)
Addended by: Juliet Rude on: 01/15/2019 10:18 AM   Modules accepted: Orders

## 2019-01-15 NOTE — Assessment & Plan Note (Signed)
stable overall by history and exam, recent data reviewed with pt, and pt to continue medical treatment as before,  to f/u any worsening symptoms or concerns;e  

## 2019-01-17 IMAGING — CR DG CHEST 2V
2 series · 2 of 2 positions shown · non-contrast
Comparison: 10/26/2014

CLINICAL DATA: Follow-up pneumonia

EXAM:
CHEST  2 VIEW

[w chest pa]
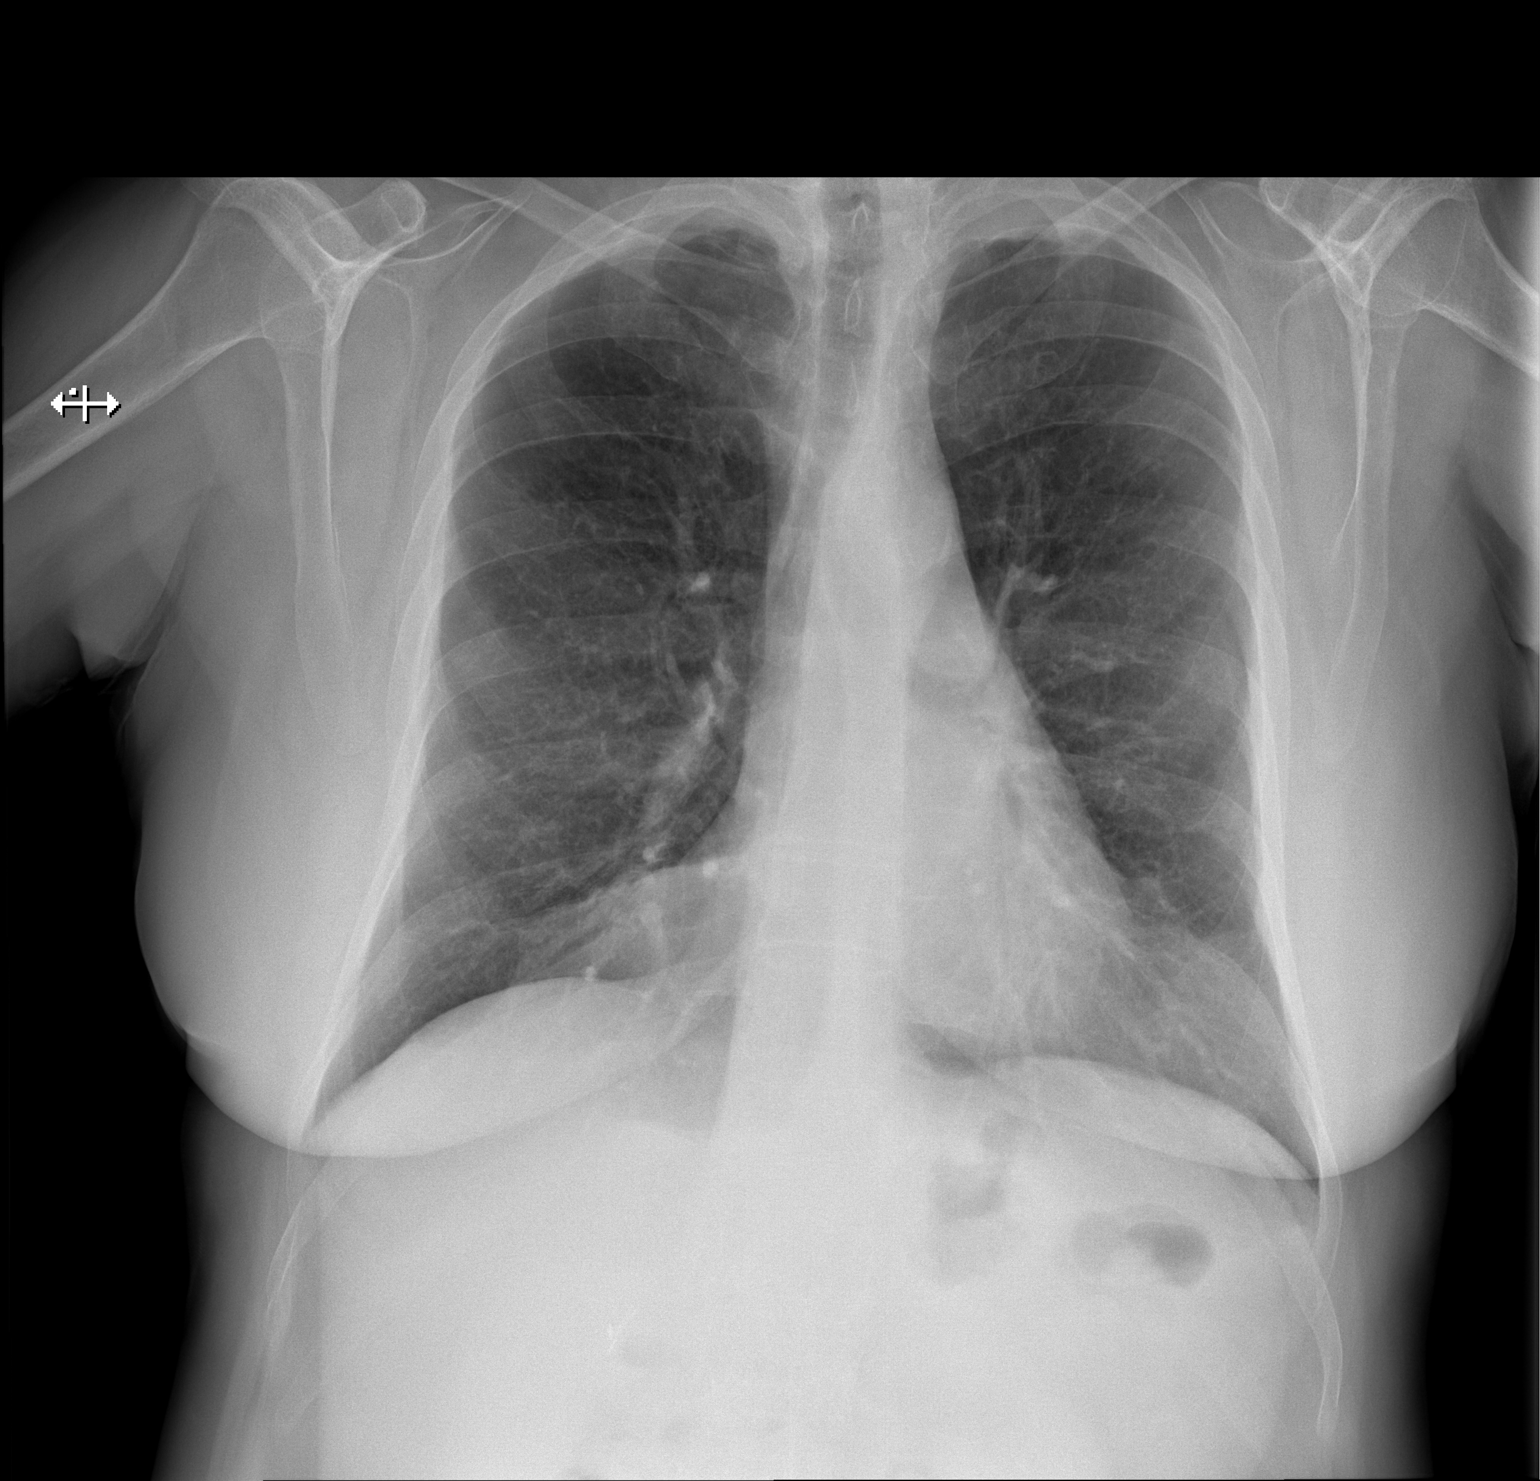

[w chest lat]
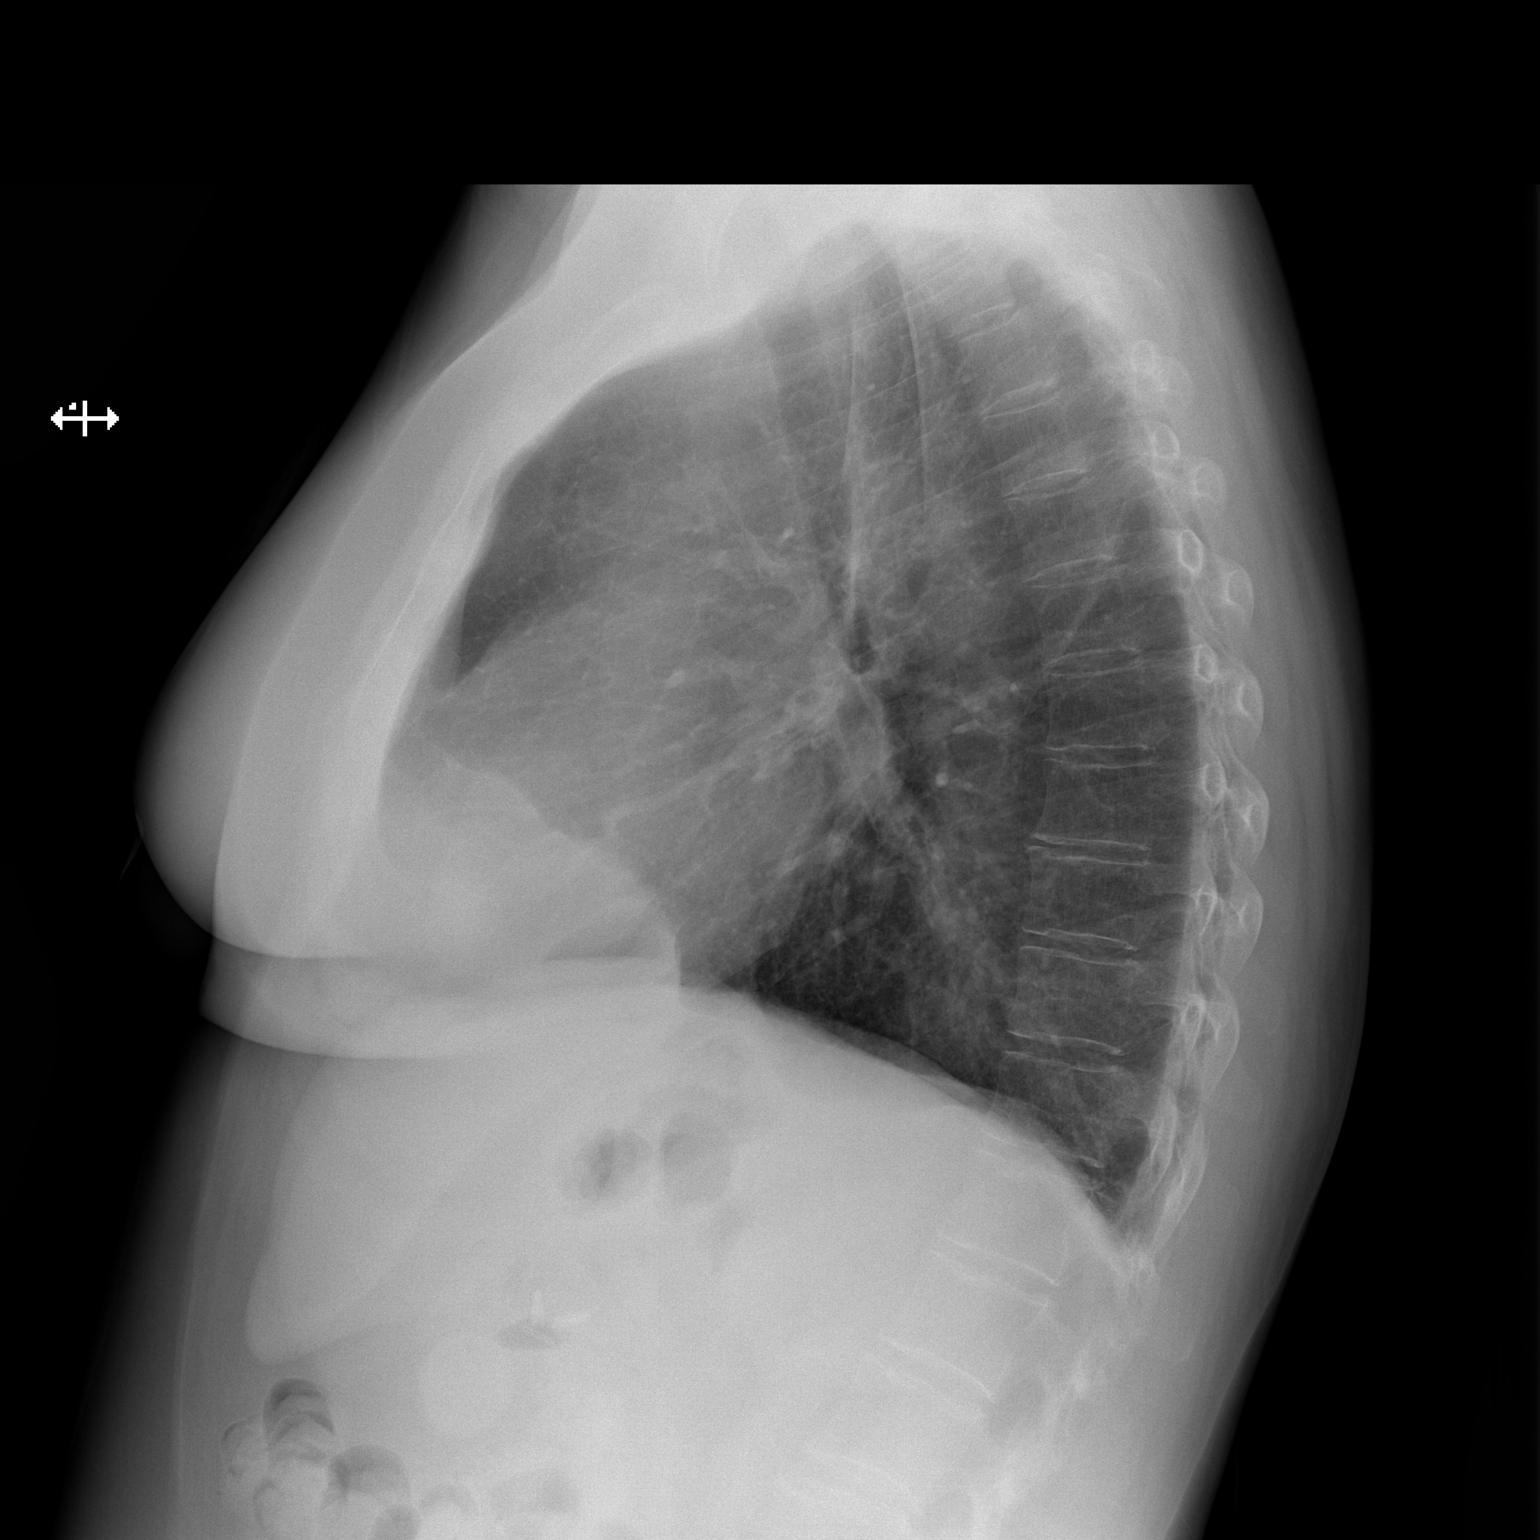

[2 of 2 positions shown; findings below may reference images not displayed]

FINDINGS: The heart size and mediastinal contours are within normal limits.
Both lungs are clear. The visualized skeletal structures are
unremarkable. Postsurgical changes are noted in the cervical spine.
IMPRESSION: No active cardiopulmonary disease.

## 2019-01-25 ENCOUNTER — Ambulatory Visit (INDEPENDENT_AMBULATORY_CARE_PROVIDER_SITE_OTHER): Payer: Medicare Other

## 2019-01-25 ENCOUNTER — Other Ambulatory Visit: Payer: Self-pay

## 2019-01-25 DIAGNOSIS — J4550 Severe persistent asthma, uncomplicated: Secondary | ICD-10-CM

## 2019-01-26 IMAGING — CT CT HEART MORP W/ CTA COR W/ SCORE W/ CA W/CM &/OR W/O CM
1 of 10 series · 1 of 20 positions shown, 2 images · non-contrast
Comparison: None.

CLINICAL DATA: 60-year-old female with hyperlipidemia and atypical
chest pain.

EXAM:
Cardiac/Coronary  CT
TECHNIQUE: The patient was scanned on a Philips 256 scanner.

[Series 300: locator · axial · 0.35mm/px · z∈[-144,-144]mm · 1 of 1 slices shown, 2 images]
[im 1/1  vessel]
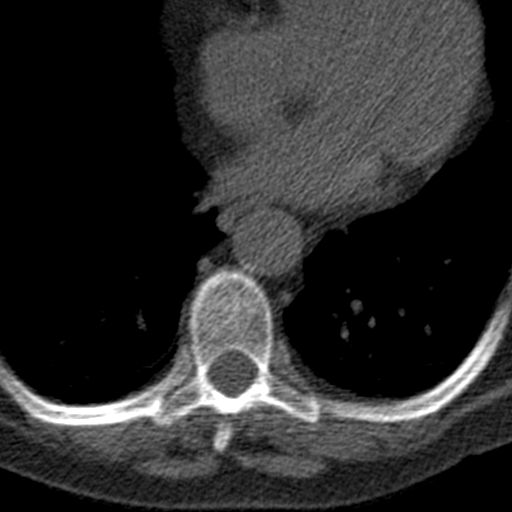
[im 1/1  lung]
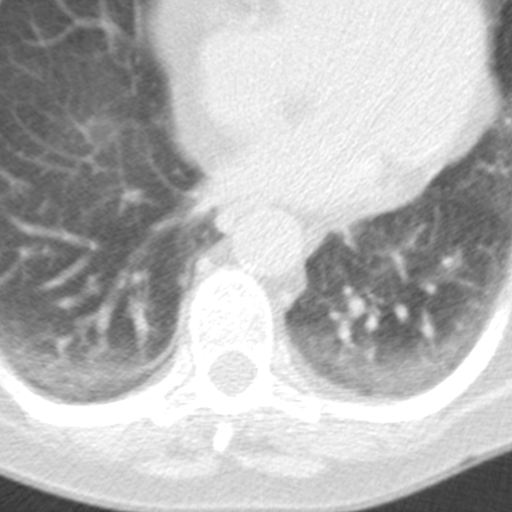

[1 of 20 positions shown; findings below may reference images not displayed]

FINDINGS: A 120 kV prospective scan was triggered in the descending thoracic
aorta at 111 HU's. Axial non-contrast 3 mm slices were carried out
through the heart. The data set was analyzed on a dedicated work
station and scored using the Agatson method. Gantry rotation speed
was 270 msecs and collimation was .9 mm. 10 mg of iv Metoprolol and
0.8 mg of sl NTG was given. The 3D data set was reconstructed in 5%
intervals of the 67-82 % of the R-R cycle. Diastolic phases were
analyzed on a dedicated work station using MPR, MIP and VRT modes.
The patient received 80 cc of contrast.

Aorta: Normal size. No dissection. Mild diffuse calcifications in
the aortic arch.

Aortic Valve:  Trileaflet.  No calcifications.

Coronary Arteries:  Normal origin.  Right dominance.

Left main artery is a large artery that gives rise to LAD and LCX
arteries and has no plaque.

LAD is a large artery that gives rise to one diagonal branch and has
no plaque.

LCX artery is a medium size non-dominant vessel that gives rise to
one OM branch and has no plaque.

RCA is a large dominant artery that gives rise to PDA and a small
PLVB and has no plaque.

Normal size of the pulmonary artery.

Normal pulmonary veins drainage into the left atrium.

No left atrial appendage thrombus.

No ASD/VSD.
IMPRESSION: 1. Coronary calcium score of 0. This was 0 percentile for age and
sex matched control.

2. Normal coronary origin.  Right dominance.

3. No evidence of CAD.  Consider non-cardiac origin of chest pain.

Himmel Abt

EXAM:
OVER-READ INTERPRETATION  CT CHEST

The following report is an over-read performed by radiologist Dr.
Shannon Paulsen [REDACTED] on 12/02/2016. This over-read
does not include interpretation of cardiac or coronary anatomy or
pathology. The coronary CTA interpretation by the cardiologist is
attached.
FINDINGS: Cardiovascular: Heart is normal size. Aorta is normal caliber with
scattered calcifications.

Mediastinum/Nodes: No adenopathy in the visualized mediastinum or
hila.

Lungs/Pleura: Mild centrilobular emphysema. Dependent atelectasis in
the lower lobes. No effusions.

Upper Abdomen: No acute findings in the upper abdomen.

Musculoskeletal: Chest wall soft tissues are unremarkable. No acute
bony abnormality.
IMPRESSION: No acute extracardiac abnormality.

Mild centrilobular emphysema.

Mild aortic atherosclerosis.

## 2019-01-27 ENCOUNTER — Other Ambulatory Visit: Payer: Self-pay | Admitting: Internal Medicine

## 2019-01-27 NOTE — Progress Notes (Signed)
VIALS EXP 01-27-2020 

## 2019-01-27 NOTE — Telephone Encounter (Signed)
Copied from [place] [id number]. Topic: Quick Communication - Rx Refill/Question >> Jan 27, 2019  4:50 PM Wynetta Emery, [place] C wrote: Medication:DEXILANT 60 MG capsule   Has the patient contacted their pharmacy? Yes  (Agent: If no, request that the patient contact the pharmacy for the refill.) (Agent: If yes, when and what did the pharmacy advise?)  Preferred Pharmacy (with phone number or street name): [facility], [place] [phone] (Phone) [phone] (Fax)    Agent: Please be advised that RX refills may take up to 3 business days. We ask that you follow-up with your pharmacy.

## 2019-01-28 ENCOUNTER — Ambulatory Visit (INDEPENDENT_AMBULATORY_CARE_PROVIDER_SITE_OTHER): Payer: Medicare Other

## 2019-01-28 DIAGNOSIS — J309 Allergic rhinitis, unspecified: Secondary | ICD-10-CM

## 2019-01-28 MED ORDER — DEXILANT 60 MG PO CPDR: DELAYED_RELEASE_CAPSULE | ORAL | 3 refills | Status: DC

## 2019-01-28 NOTE — Telephone Encounter (Signed)
Done erx 

## 2019-02-01 ENCOUNTER — Telehealth: Payer: Self-pay | Admitting: Internal Medicine

## 2019-02-01 MED ORDER — MECLIZINE HCL 12.5 MG PO TABS: 12.5000 mg | ORAL_TABLET | Freq: Three times a day (TID) | ORAL | 1 refills | Status: DC | PRN

## 2019-02-01 MED ORDER — PROMETHAZINE-DM 6.25-15 MG/5ML PO SYRP: 5.0000 mL | ORAL_SOLUTION | Freq: Four times a day (QID) | ORAL | 1 refills | Status: DC | PRN

## 2019-02-01 NOTE — Telephone Encounter (Signed)
Done erx 

## 2019-02-01 NOTE — Telephone Encounter (Signed)
Copied from [place] [id number]. Topic: Quick Communication - Rx Refill/Question >> Feb 01, 2019 10:37 AM Burchel, Abbi R wrote: Medication: meclizine (ANTIVERT) 12.5 MG tablet, promethazine-dextromethorphan (PROMETHAZINE-DM) 6.25-15 MG/5ML syrup  Preferred Pharmacy: [id number] [address], [place] [phone] (Phone) [phone] (Fax)    Pt was advised that RX refills may take up to 3 business days. We ask that you follow-up with your pharmacy.

## 2019-02-01 NOTE — Telephone Encounter (Signed)
Reviewed chart pt is up-to-date sent refill for the Meclizine. Pls advise on cough syrup.Marland KitchenJohny Chess

## 2019-02-02 DIAGNOSIS — J3089 Other allergic rhinitis: Secondary | ICD-10-CM | POA: Diagnosis not present

## 2019-02-04 ENCOUNTER — Ambulatory Visit (INDEPENDENT_AMBULATORY_CARE_PROVIDER_SITE_OTHER): Payer: Medicare Other

## 2019-02-04 DIAGNOSIS — J309 Allergic rhinitis, unspecified: Secondary | ICD-10-CM | POA: Diagnosis not present

## 2019-02-05 ENCOUNTER — Telehealth: Payer: Self-pay | Admitting: Internal Medicine

## 2019-02-05 ENCOUNTER — Telehealth: Payer: Self-pay | Admitting: Allergy & Immunology

## 2019-02-05 MED ORDER — PROAIR HFA 108 (90 BASE) MCG/ACT IN AERS: 1.0000 | INHALATION_SPRAY | RESPIRATORY_TRACT | 0 refills | Status: DC | PRN

## 2019-02-05 NOTE — Telephone Encounter (Signed)
Patient wants her pharmacy changed to [facility] on [address] and [address]. She would also like a refill on [address].

## 2019-02-05 NOTE — Telephone Encounter (Signed)
Copied from [place] [id number]. Topic: Quick Communication - Rx Refill/Question >> Feb 05, 2019  9:27 AM Virl Axe D wrote: Medication: ALPRAZolam Duanne Moron) 1 MG tablet   Has the patient contacted their pharmacy? Yes.   (Agent: If no, request that the patient contact the pharmacy for the refill.) (Agent: If yes, when and what did the pharmacy advise?)  Preferred Pharmacy (with phone number or street name): [facility], [place] [phone] (Phone) [phone] (Fax)    Agent: Please be advised that RX refills may take up to 3 business days. We ask that you follow-up with your pharmacy.

## 2019-02-05 NOTE — Telephone Encounter (Signed)
Xanax appears to be too soon, as was given total 6 mo tx in Oct 20 2018

## 2019-02-05 NOTE — Telephone Encounter (Signed)
Called and left message informing patient that I have sent in refill and she would need an OV for further refills.

## 2019-02-17 DIAGNOSIS — G43719 Chronic migraine without aura, intractable, without status migrainosus: Secondary | ICD-10-CM | POA: Diagnosis not present

## 2019-02-17 DIAGNOSIS — M542 Cervicalgia: Secondary | ICD-10-CM | POA: Diagnosis not present

## 2019-02-17 DIAGNOSIS — M791 Myalgia, unspecified site: Secondary | ICD-10-CM | POA: Diagnosis not present

## 2019-02-23 ENCOUNTER — Other Ambulatory Visit: Payer: Self-pay | Admitting: Allergy & Immunology

## 2019-02-24 ENCOUNTER — Telehealth: Payer: Self-pay | Admitting: *Deleted

## 2019-02-24 ENCOUNTER — Ambulatory Visit (INDEPENDENT_AMBULATORY_CARE_PROVIDER_SITE_OTHER): Payer: Medicare Other | Admitting: *Deleted

## 2019-02-24 DIAGNOSIS — J309 Allergic rhinitis, unspecified: Secondary | ICD-10-CM

## 2019-02-24 NOTE — Telephone Encounter (Addendum)
You drop patient's allergy shot back to every 2 weeks in March 2019. Typically we go to every 3 weeks after a year but you note states you changed her schedule to see if it help provide better relief. Do you want to keep her at every 2 weeks or can we go to every 3 weeks now? Please advise

## 2019-02-25 ENCOUNTER — Encounter: Payer: Self-pay | Admitting: Internal Medicine

## 2019-02-25 ENCOUNTER — Telehealth: Payer: Self-pay | Admitting: Internal Medicine

## 2019-02-25 ENCOUNTER — Ambulatory Visit (INDEPENDENT_AMBULATORY_CARE_PROVIDER_SITE_OTHER): Payer: Medicare Other | Admitting: Internal Medicine

## 2019-02-25 DIAGNOSIS — F411 Generalized anxiety disorder: Secondary | ICD-10-CM | POA: Diagnosis not present

## 2019-02-25 DIAGNOSIS — J441 Chronic obstructive pulmonary disease with (acute) exacerbation: Secondary | ICD-10-CM | POA: Diagnosis not present

## 2019-02-25 DIAGNOSIS — F17210 Nicotine dependence, cigarettes, uncomplicated: Secondary | ICD-10-CM

## 2019-02-25 DIAGNOSIS — K2100 Gastro-esophageal reflux disease with esophagitis, without bleeding: Secondary | ICD-10-CM

## 2019-02-25 MED ORDER — ONDANSETRON HCL 4 MG PO TABS: 4.0000 mg | ORAL_TABLET | Freq: Three times a day (TID) | ORAL | 0 refills | Status: DC | PRN

## 2019-02-25 MED ORDER — PREDNISONE 10 MG PO TABS: ORAL_TABLET | ORAL | 0 refills | Status: DC

## 2019-02-25 MED ORDER — HYDROCODONE-HOMATROPINE 5-1.5 MG/5ML PO SYRP: 5.0000 mL | ORAL_SOLUTION | Freq: Four times a day (QID) | ORAL | 0 refills | Status: DC | PRN

## 2019-02-25 NOTE — Patient Instructions (Addendum)
Please take all new medication as prescribed  - the prednisone, cough medicine, and nausea medication as needed  Please continue all other medications as before, and refills have been done if requested.  Please have the pharmacy call with any other refills you may need.  Please keep your appointments with your specialists as you may have planned

## 2019-02-25 NOTE — Assessment & Plan Note (Signed)
stable overall by history and exam, recent data reviewed with pt, and pt to continue medical treatment as before,  to f/u any worsening symptoms or concerns  

## 2019-02-25 NOTE — Assessment & Plan Note (Signed)
Mild to mod, afeb, will tx with prednisone burst and cough med prn, consider antibx for fever or worsening, declines cxr,  to f/u any worsening symptoms or concerns

## 2019-02-25 NOTE — Telephone Encounter (Signed)
I have scheduled patient for a 7pm phone visit with Dr. Jenny Reichmann for coughing and chest congestion w/ shortness of breath.  Please advise patient if visit needs to be changed.  Patient can not complete a virtual visit.

## 2019-02-25 NOTE — Telephone Encounter (Signed)
Will make note on patient's flowsheet. FYI she called her PCP today for coughing, shortness of breath and did not mention anything to me yesterday when she received her shot.

## 2019-02-25 NOTE — Progress Notes (Signed)
Patient ID: Lori [name], female   DOB: 05-Jan-1956, 63 y.o.   MRN: [id number]  Virtual Visit via Video Note  I connected with Lori [name] on 02/25/19 at  7:00 PM EDT by a video enabled telemedicine application and verified that I am speaking with the correct person using two identifiers.  Location: Patient: at home Provider: at office   I discussed the limitations of evaluation and management by telemedicine and the availability of in person appointments. The patient expressed understanding and agreed to proceed.  History of Present Illness: Here with acute onset mild to mod 2-3 days worsening ST, HA, general weakness and malaise, with prod cough clear sputum associated with mild sob and wheezing, but Pt denies chest pain, orthopnea, PND, increased LE swelling, palpitations, dizziness or syncope.  Symptoms have milder and ongoing x 3 mo.   Pt denies fever, wt loss, night sweats, loss of appetite, or other constitutional symptoms  Denies worsening reflux, abd pain, dysphagia, vomiting, bowel change or blood, but does have occasional nausea and even loose stool today Past Medical History:  Diagnosis Date  . Acute MI ([place])    x3 - by report only. She has had 3 negative Myoview stress test with no evidence of prior infarct.  . Allergic rhinitis   . Allergy   . Anemia   . Ankle fracture 05/2016  . Anxiety   . Arthritis   . Asthma   . Barrett esophagus 2007  . Chest pain 05-02-2009   echo  EF 55%  . COPD (chronic obstructive pulmonary disease) with chronic bronchitis (HCC)    & Emphysema  . Depression   . Duodenitis without mention of hemorrhage 2007  . Esophageal reflux 2007  . Esophageal stricture   . Full dentures   . H/O hiatal hernia   . Hiatal hernia [id number]  . Hyperlipidemia   . Multiple fractures    from falls, fx rt. elbow, fx left wrist, bilateral ankles  . Pneumonia 10/2016  . Restrictive lung disease 06/30/2017  . Ulcer    Past Surgical History:  Procedure  Laterality Date  . ABDOMINAL HYSTERECTOMY     BSO  . APPENDECTOMY    . CESAREAN SECTION     x 2  . CHOLECYSTECTOMY    . CHONDROPLASTY Left 01/06/2015   Procedure: CHONDROPLASTY;  Surgeon: Marybelle Killings, MD;  Location: [facility];  Service: Orthopedics;  Laterality: Left;  . COLONOSCOPY    . ELBOW FRACTURE SURGERY     right  . KNEE ARTHROSCOPY WITH EXCISION PLICA Left 4/48/1856   Procedure: KNEE ARTHROSCOPY WITH EXCISION PLICA;  Surgeon: Marybelle Killings, MD;  Location: [facility];  Service: Orthopedics;  Laterality: Left;  . Lower Extremity Arterial Dopplers  07/06/2013   RABI 1.0, LABI 1.1.; No evidence of significant vascular atherogenic plaque  . NECK SURGERY    . NM MYOVIEW LTD  09/2014   LOW RISK. Normal EF of 65% with no regional wall motion abnormalities. No ischemia or infarction.  . TONSILLECTOMY    . WRIST FRACTURE SURGERY     left, has plate    reports that she has quit smoking. Her smoking use included cigarettes. She started smoking about 41 years ago. She has a 39.00 pack-year smoking history. She has never used smokeless tobacco. She reports that she does not drink alcohol or use drugs. family history includes Dementia in her mother; Diabetes in her maternal aunt, maternal grandmother, and another family member; Emphysema  in her father; Heart disease in her brother. Allergies  Allergen Reactions  . Bee Venom Swelling  . Codeine Nausea Only and Other (See Comments)    CAUSES ULCERS  . Ibuprofen Nausea And Vomiting  . Clonidine Derivatives Other (See Comments)    Unknown   . Tramadol Nausea Only  . Latex Rash  . Levofloxacin Nausea Only  . Propoxyphene N-Acetaminophen Nausea Only   Current Outpatient Medications on File Prior to Visit  Medication Sig Dispense Refill  . ALPRAZolam (XANAX) 1 MG tablet 1 tab by mouth daily as needed (Patient taking differently: Take 1 mg by mouth daily as needed for anxiety. ) 30 tablet 5  . baclofen  (LIORESAL) 10 MG tablet Take 10 mg by mouth 2 (two) times daily as needed for muscle spasms. Limit 1-2 treatments per week.    . celecoxib (CELEBREX) 200 MG capsule Take 200 mg by mouth daily.    . citalopram (CELEXA) 20 MG tablet Take 1 tablet (20 mg total) by mouth daily. 90 tablet 3  . cyclobenzaprine (FLEXERIL) 5 MG tablet TAKE 1 TABLET(5 MG) BY MOUTH THREE TIMES DAILY AS NEEDED FOR MUSCLE SPASMS 60 tablet 1  . DEXILANT 60 MG capsule TAKE 1 CAPSULE(60 MG) BY MOUTH DAILY 90 capsule 3  . EPINEPHrine 0.3 mg/0.3 mL IJ SOAJ injection INJECT AS DIRECTED FOR SEVERE ALLERGIC REACTION 2 Device 2  . FASENRA 30 MG/ML SOSY INJECT 30MG  SUBCUTANEOUSLY  EVERY 8 WEEKS (GIVEN AT  PRESCRIBERS OFFICE) 1 mL 8  . gabapentin (NEURONTIN) 100 MG capsule Take 1 capsule (100 mg total) by mouth 3 (three) times daily. 270 capsule 1  . HYDROcodone-acetaminophen (NORCO/VICODIN) 5-325 MG tablet Take 1 tablet by mouth every 6 (six) hours as needed for moderate pain. 30 tablet 0  . levETIRAcetam (KEPPRA) 500 MG tablet Take 500 mg by mouth 2 (two) times daily.    . meclizine (ANTIVERT) 12.5 MG tablet Take 1 tablet (12.5 mg total) by mouth 3 (three) times daily as needed for dizziness. 40 tablet 1  . montelukast (SINGULAIR) 10 MG tablet Take 1 tablet (10 mg total) by mouth at bedtime. 90 tablet 3  . nitroGLYCERIN (NITROSTAT) 0.4 MG SL tablet PLACE 1 TABLET UNDER THE TONGUE EVERY 5 MINUTES AS NEEDED FOR CHEST PAIN. 25 tablet 1  . pregabalin (LYRICA) 75 MG capsule Take 75 mg by mouth 2 (two) times daily.    Marland Kitchen PROAIR HFA 108 (90 Base) MCG/ACT inhaler Inhale 1-2 puffs into the lungs every 4 (four) hours as needed for shortness of breath or wheezing. 1 Inhaler 0  . promethazine-dextromethorphan (PROMETHAZINE-DM) 6.25-15 MG/5ML syrup Take 5 mLs by mouth 4 (four) times daily as needed for cough. 118 mL 1  . rosuvastatin (CRESTOR) 20 MG tablet Take 1 tablet (20 mg total) by mouth daily. 90 tablet 3  . valACYclovir (VALTREX) 500 MG tablet  TAKE 1 TABLET(500 MG) BY MOUTH DAILY 30 tablet 5   Current Facility-Administered Medications on File Prior to Visit  Medication Dose Route Frequency Provider Last Rate Last Dose  . Benralizumab SOSY 30 mg  30 mg Subcutaneous Q8 Toney Reil, MD   30 mg at 01/25/19 1610  . methylPREDNISolone acetate (DEPO-MEDROL) injection 40 mg  40 mg Other Once Magnus Sinning, MD        Observations/Objective: Alert, NAD, appropriate mood and affect, resps normal to mild increased, cn 2-12 intact, moves all 4s, no visible rash or swelling Lab Results  Component Value Date   WBC 10.2  12/28/2018   HGB 13.3 12/28/2018   HCT 40.6 12/28/2018   PLT 294 12/28/2018   GLUCOSE 106 (H) 12/28/2018   CHOL 282 (H) 10/20/2018   TRIG 332.0 (H) 10/20/2018   HDL 42.50 10/20/2018   LDLDIRECT 201.0 10/20/2018   LDLCALC Comment 03/12/2017   ALT 13 12/28/2018   AST 16 12/28/2018   NA 137 12/28/2018   K 4.2 12/28/2018   CL 107 12/28/2018   CREATININE 0.91 12/28/2018   BUN 9 12/28/2018   CO2 22 12/28/2018   TSH 2.41 10/20/2018   INR 0.9 08/31/2008   HGBA1C 5.5 06/21/2016   Assessment and Plan: See notes  Follow Up Instructions: See notes   I discussed the assessment and treatment plan with the patient. The patient was provided an opportunity to ask questions and all were answered. The patient agreed with the plan and demonstrated an understanding of the instructions.   The patient was advised to call back or seek an in-person evaluation if the symptoms worsen or if the condition fails to improve as anticipated.   Cathlean Cower, MD

## 2019-02-25 NOTE — Assessment & Plan Note (Signed)
Encouraged to quit smoking, as this is the most benefit she would get from this visit

## 2019-02-25 NOTE — Telephone Encounter (Signed)
I think she is got and it being on every 2 weeks indefinitely.  I feel I can mention spacing out to every 3 weeks that the last several visits but she never gets excited about that.  I will bring up the next time she sees me.  She should be seeing me soon since I last saw her in November.  She was supposed to follow-up in February.  Please call her and make an appointment when he had a chance.  Salvatore Marvel, MD [facility]

## 2019-02-26 NOTE — Telephone Encounter (Signed)
Let's flag her so that we can see whether she gets tested for COVID. Thanks for the update!   Salvatore Marvel, MD [facility]

## 2019-03-01 NOTE — Telephone Encounter (Signed)
appt made for tomorrow 1:30pm with Dr Tedd Sias

## 2019-03-01 NOTE — Telephone Encounter (Signed)
She needs a follow-up appointment scheduled since it is been 7 months since we have seen her.  She is getting her next for center injection in July, but it is a day that I am not working.  Please call her and see if we can do a tele-visit.  I have a lot of openings tomorrow.

## 2019-03-02 ENCOUNTER — Ambulatory Visit (INDEPENDENT_AMBULATORY_CARE_PROVIDER_SITE_OTHER): Payer: Medicare Other | Admitting: Allergy & Immunology

## 2019-03-02 ENCOUNTER — Other Ambulatory Visit: Payer: Self-pay

## 2019-03-02 ENCOUNTER — Encounter: Payer: Self-pay | Admitting: Allergy & Immunology

## 2019-03-02 DIAGNOSIS — J302 Other seasonal allergic rhinitis: Secondary | ICD-10-CM | POA: Diagnosis not present

## 2019-03-02 DIAGNOSIS — Z9114 Patient's other noncompliance with medication regimen: Secondary | ICD-10-CM | POA: Diagnosis not present

## 2019-03-02 DIAGNOSIS — J3089 Other allergic rhinitis: Secondary | ICD-10-CM

## 2019-03-02 DIAGNOSIS — F17210 Nicotine dependence, cigarettes, uncomplicated: Secondary | ICD-10-CM

## 2019-03-02 DIAGNOSIS — J4550 Severe persistent asthma, uncomplicated: Secondary | ICD-10-CM | POA: Diagnosis not present

## 2019-03-02 MED ORDER — DULERA 200-5 MCG/ACT IN AERO: 2.0000 | INHALATION_SPRAY | Freq: Every day | RESPIRATORY_TRACT | 5 refills | Status: DC

## 2019-03-02 MED ORDER — AMOXICILLIN 875 MG PO TABS: 875.0000 mg | ORAL_TABLET | Freq: Two times a day (BID) | ORAL | 0 refills | Status: AC

## 2019-03-02 NOTE — Patient Instructions (Addendum)
1. Moderate persistent asthma, uncomplicated -I did offer to change her controller medication to nebulized form, but she does not want to use a nebulizer at all. -She is adamantly refusing any kind a daily controller medication, but we finally compromised on 2 puffs once a day of a controller medication. -Symbicort did not seem to be covered by her insurance, so we changed to [facility] 200/5 mcg instead. - Daily controller medication(s): Singulair (montelukast) 10mg  daily + Dulera 200/5 mcg two puffs once daily with spacer + Fasenra every 8 weeks.  - Rescue medications: Xopenex 4 puffs every 4-6 hours as needed or Xopenex nebulizer one vial puffs every 4-6 hours as needed - Asthma control goals:  * Full participation in all desired activities (may need albuterol before activity) * Albuterol use two time or less a week on average (not counting use with activity) * Cough interfering with sleep two time or less a month * Oral steroids no more than once a year * No hospitalizations   2. Perennial allergic rhinitis  - Continue with shots every two weeks. - Continue with Singulair 10mg  daily. - Continue with cetirizine (Zyrtec) 10mg  daily to help with the sneezing and other allergy symptoms.   3.  Acute bronchitis -Start amoxicillin 875 mg twice daily for 2 weeks. -There is no need for another antitussive medication. -She is status post 1 round of prednisone, which should be sufficient in combination with the amoxicillin today.  4. Return in about 4 months (around 07/02/2019).   Please inform us of any Emergency Department visits, hospitalizations, or changes in symptoms. Call us before going to the ED for breathing or allergy symptoms since we might be able to fit you in for a sick visit. Feel free to contact us anytime with any questions, problems, or concerns.  It was a pleasure to see you again today!   Websites that have reliable patient information: 1. American Academy of Asthma, Allergy,  and Immunology: www.aaaai.org 2. Food Allergy Research and Education (FARE): foodallergy.org 3. Mothers of Asthmatics: http://www.asthmacommunitynetwork.org 4. American College of Allergy, Asthma, and Immunology: www.acaai.org
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
## 2019-03-10 ENCOUNTER — Ambulatory Visit (INDEPENDENT_AMBULATORY_CARE_PROVIDER_SITE_OTHER): Payer: Medicare Other | Admitting: *Deleted

## 2019-03-10 ENCOUNTER — Other Ambulatory Visit: Payer: Self-pay | Admitting: *Deleted

## 2019-03-10 DIAGNOSIS — J309 Allergic rhinitis, unspecified: Secondary | ICD-10-CM | POA: Diagnosis not present

## 2019-03-10 MED ORDER — EPINEPHRINE 0.3 MG/0.3ML IJ SOAJ: INTRAMUSCULAR | 2 refills | Status: DC

## 2019-03-10 MED ORDER — PROAIR HFA 108 (90 BASE) MCG/ACT IN AERS: 1.0000 | INHALATION_SPRAY | RESPIRATORY_TRACT | 0 refills | Status: DC | PRN

## 2019-03-10 MED ORDER — DULERA 200-5 MCG/ACT IN AERO: 2.0000 | INHALATION_SPRAY | Freq: Every day | RESPIRATORY_TRACT | 1 refills | Status: DC

## 2019-03-11 ENCOUNTER — Other Ambulatory Visit: Payer: Self-pay

## 2019-03-11 MED ORDER — NITROGLYCERIN 0.4 MG SL SUBL: SUBLINGUAL_TABLET | SUBLINGUAL | 1 refills | Status: DC

## 2019-03-12 ENCOUNTER — Other Ambulatory Visit: Payer: Self-pay | Admitting: *Deleted

## 2019-03-17 ENCOUNTER — Telehealth: Payer: Self-pay | Admitting: Internal Medicine

## 2019-03-17 MED ORDER — DEXILANT 60 MG PO CPDR: DELAYED_RELEASE_CAPSULE | ORAL | 1 refills | Status: DC

## 2019-03-17 NOTE — Telephone Encounter (Signed)
Medication Refill - Medication: DEXILANT 60 MG capsule / 90 day supply   Has the patient contacted their pharmacy? Yes.   (Agent: If no, request that the patient contact the pharmacy for the refill.) (Agent: If yes, when and what did the pharmacy advise?)  Preferred Pharmacy (with phone number or street name):  [facility] - [place], [place] [address] [phone] (Phone) [phone] (Fax)     Agent: Please be advised that RX refills may take up to 3 business days. We ask that you follow-up with your pharmacy.

## 2019-03-22 ENCOUNTER — Other Ambulatory Visit: Payer: Self-pay

## 2019-03-22 ENCOUNTER — Ambulatory Visit (INDEPENDENT_AMBULATORY_CARE_PROVIDER_SITE_OTHER): Payer: Medicare Other

## 2019-03-22 DIAGNOSIS — J4550 Severe persistent asthma, uncomplicated: Secondary | ICD-10-CM

## 2019-03-24 ENCOUNTER — Ambulatory Visit (INDEPENDENT_AMBULATORY_CARE_PROVIDER_SITE_OTHER): Payer: Medicare Other | Admitting: *Deleted

## 2019-03-24 DIAGNOSIS — J309 Allergic rhinitis, unspecified: Secondary | ICD-10-CM | POA: Diagnosis not present

## 2019-03-26 DIAGNOSIS — G43719 Chronic migraine without aura, intractable, without status migrainosus: Secondary | ICD-10-CM | POA: Diagnosis not present

## 2019-03-27 IMAGING — DX DG CHEST 2V
2 series · 2 of 2 positions shown · non-contrast
Comparison: 12/02/2016, 11/23/2016

CLINICAL DATA: Cough congestion and dyspnea

EXAM:
CHEST  2 VIEW

[chest pa]
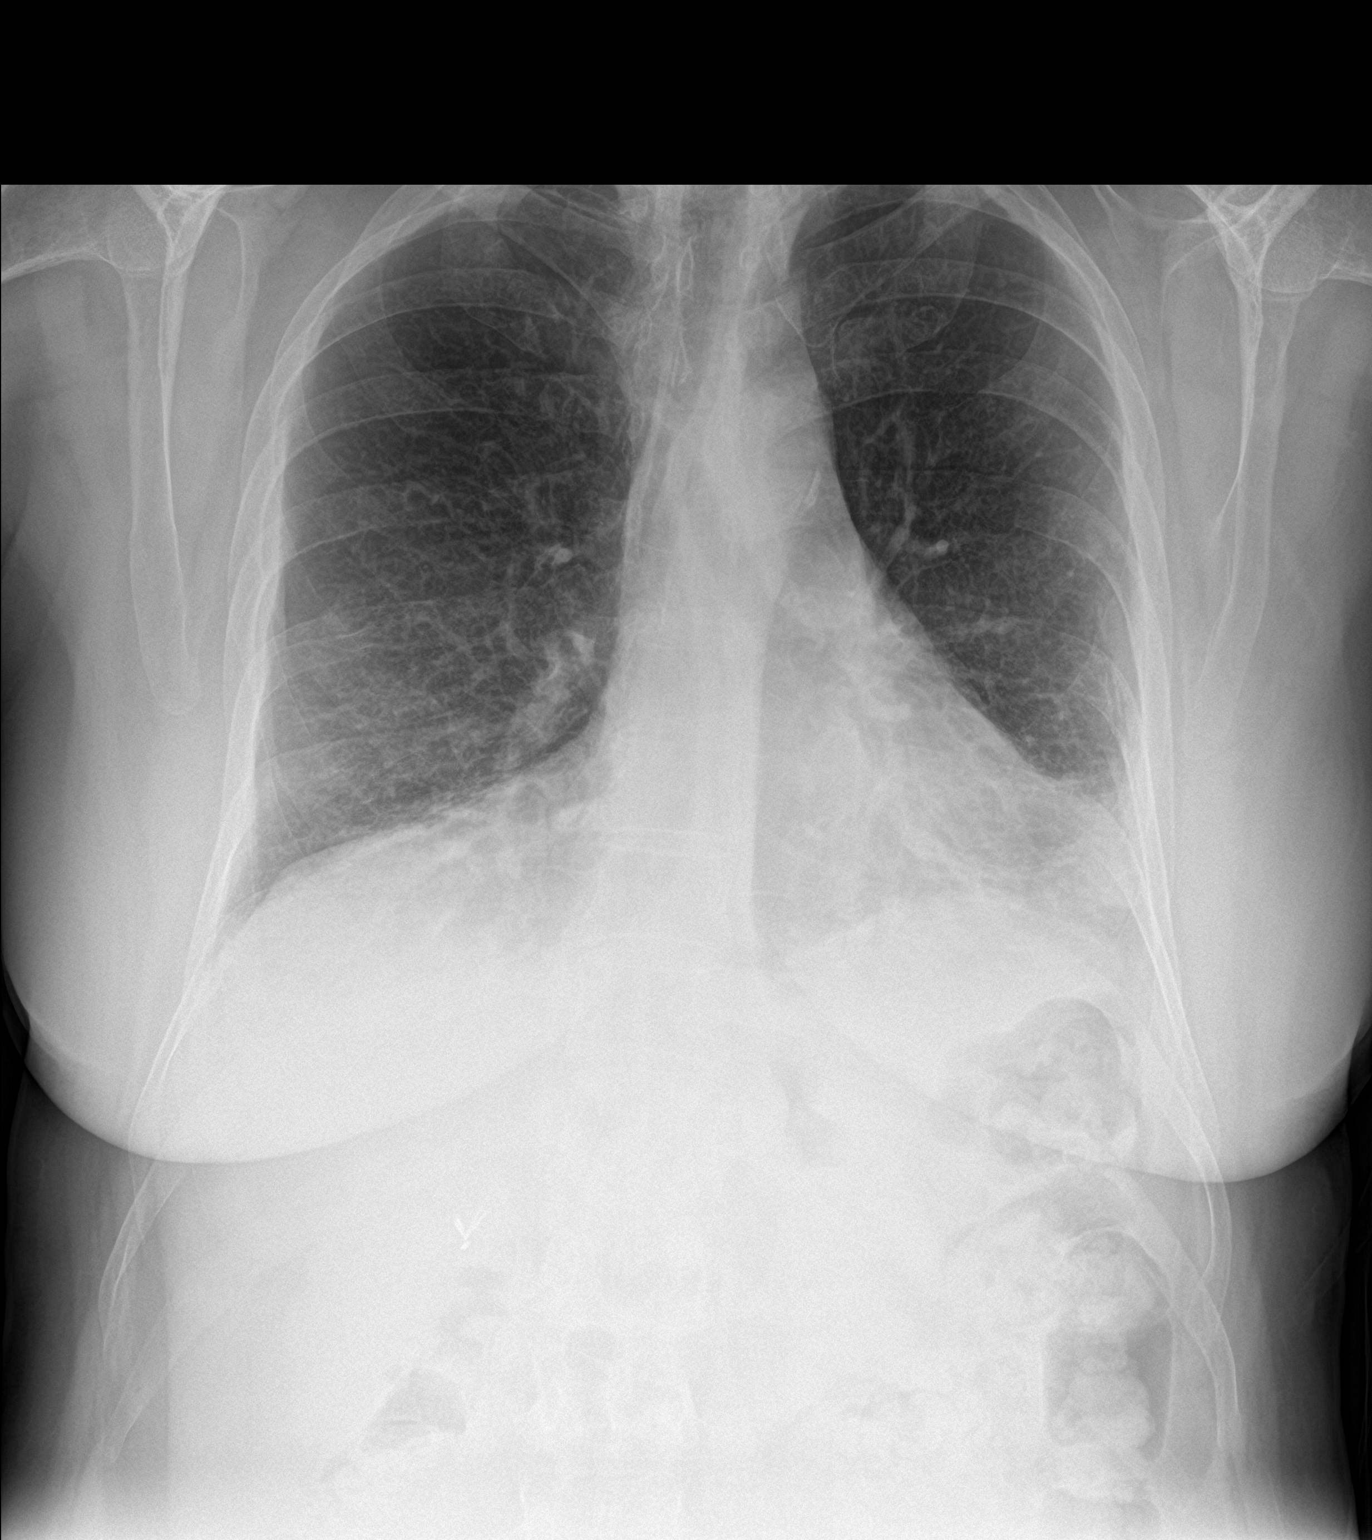

[chest lat]
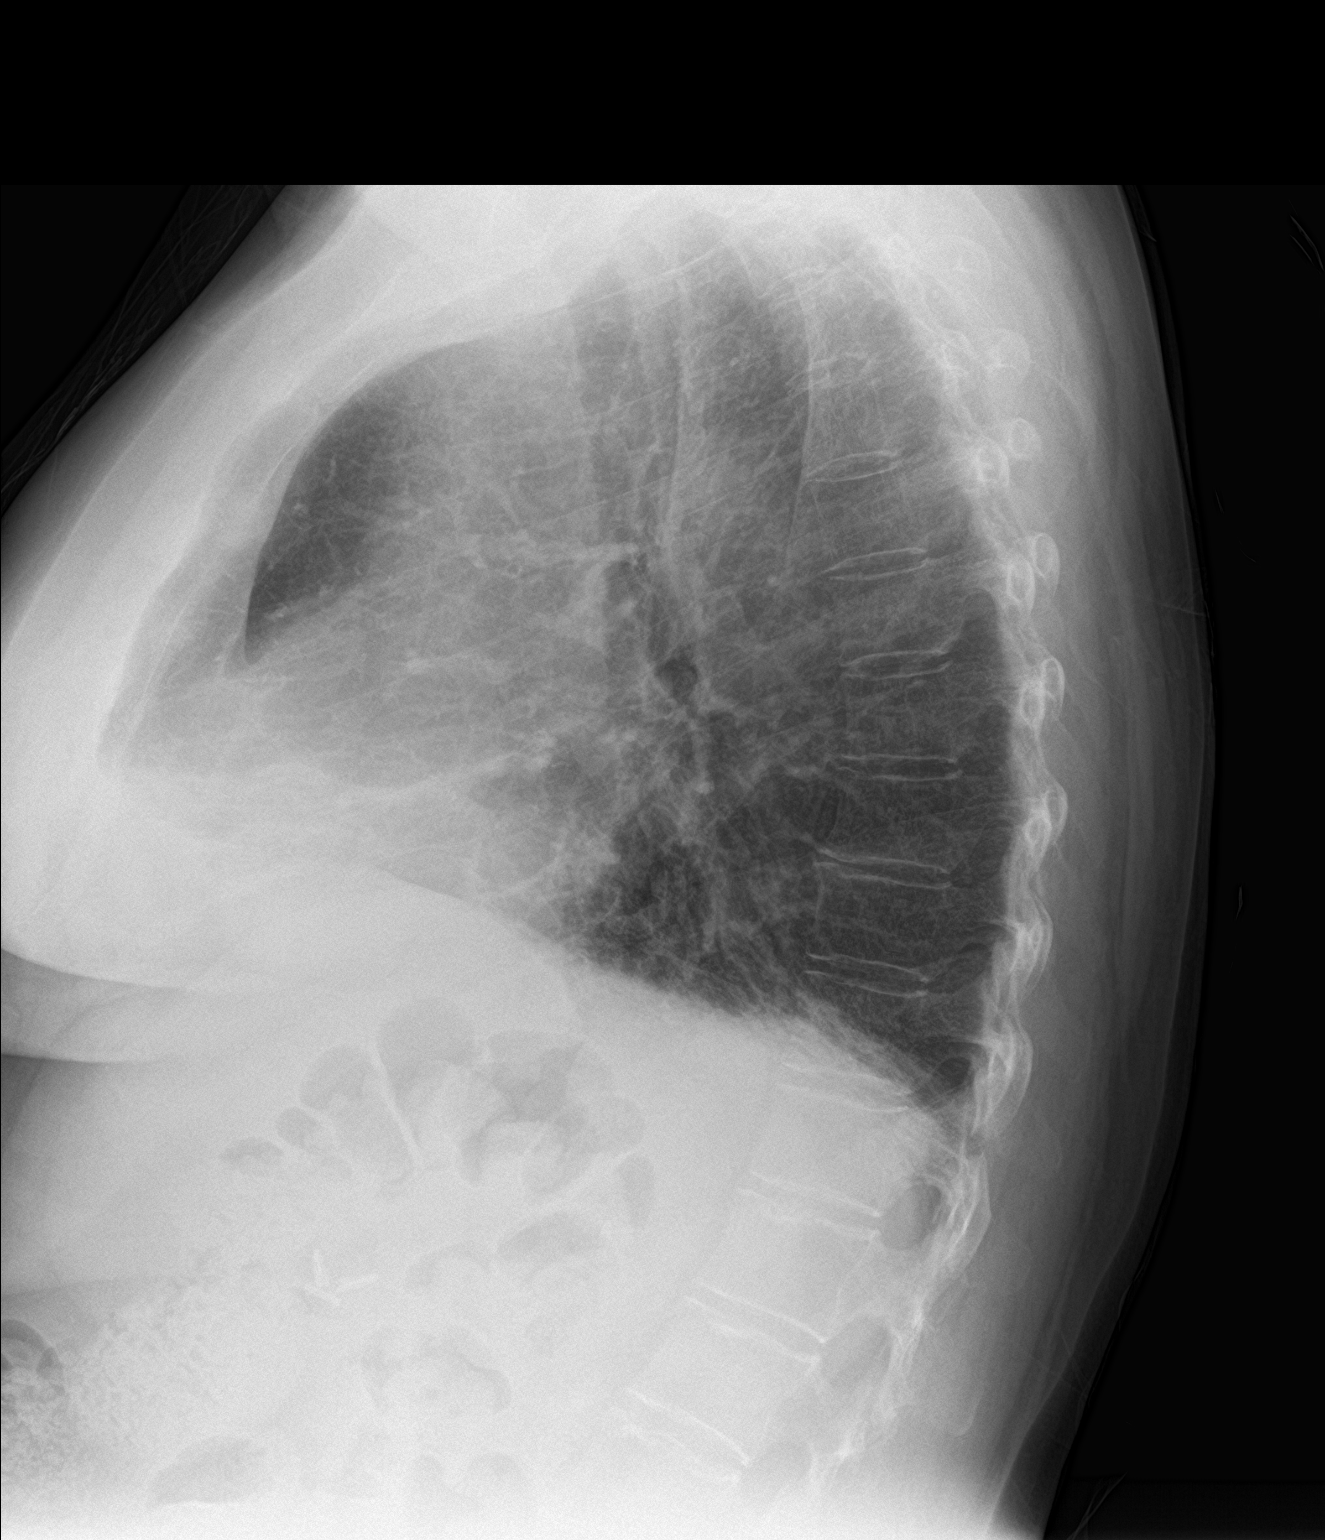

[2 of 2 positions shown; findings below may reference images not displayed]

FINDINGS: Partially visualized cervical spine hardware. Mild hyperinflation.
Ill-defined bibasilar left greater than right opacity suspicious for
infiltrate. No pleural effusion. Stable cardiomediastinal
silhouette. No pneumothorax. Biapical pleural thickening. Surgical
clips in the right upper quadrant
IMPRESSION: Streaky left greater than right bibasilar opacity suspicious for
infiltrates. Radiographic follow-up to resolution recommended.

## 2019-03-29 ENCOUNTER — Ambulatory Visit (INDEPENDENT_AMBULATORY_CARE_PROVIDER_SITE_OTHER): Payer: Medicare Other

## 2019-03-29 DIAGNOSIS — J309 Allergic rhinitis, unspecified: Secondary | ICD-10-CM

## 2019-04-02 ENCOUNTER — Telehealth: Payer: Self-pay | Admitting: *Deleted

## 2019-04-02 MED ORDER — TRAMADOL HCL 50 MG PO TABS: 50.0000 mg | ORAL_TABLET | Freq: Four times a day (QID) | ORAL | 0 refills | Status: DC | PRN

## 2019-04-02 NOTE — Telephone Encounter (Signed)
Pt informed of below.  

## 2019-04-02 NOTE — Telephone Encounter (Signed)
Copied from [place] [id number]. Topic: General - Inquiry >> Apr 01, 2019  1:23 PM Sharene Skeans wrote: Reason for CRM: Pt fell on her dogs cage and is experiencing some pain from the fall and asked if Dr. Jenny Reichmann would send in some medication to relieve the pain/ please advise >> Apr 01, 2019  2:16 PM Peace, Tammy L wrote: Called pt to schedule with Dr. Jenny Reichmann.  Patient states she wants to wait to be seen at her next appt on 8/5.

## 2019-04-02 NOTE — Telephone Encounter (Signed)
Ok to try the tramadol again even though she may have had nausea with this before

## 2019-04-02 NOTE — Telephone Encounter (Signed)
Copied from [place]. Topic: General - Other >> Apr 02, 2019  2:33 PM Yvette Rack wrote: Reason for CRM: Hui, Suzie Portela Pharmacist stated she needs a diagnosis for the Rx traMADol (ULTRAM) 50 MG tablet. Cb# [phone]

## 2019-04-05 ENCOUNTER — Ambulatory Visit (INDEPENDENT_AMBULATORY_CARE_PROVIDER_SITE_OTHER): Payer: Medicare Other | Admitting: *Deleted

## 2019-04-05 ENCOUNTER — Telehealth: Payer: Self-pay | Admitting: Internal Medicine

## 2019-04-05 DIAGNOSIS — J309 Allergic rhinitis, unspecified: Secondary | ICD-10-CM | POA: Diagnosis not present

## 2019-04-05 MED ORDER — MECLIZINE HCL 12.5 MG PO TABS: 12.5000 mg | ORAL_TABLET | Freq: Three times a day (TID) | ORAL | 1 refills | Status: DC | PRN

## 2019-04-05 MED ORDER — ALPRAZOLAM 1 MG PO TABS: ORAL_TABLET | ORAL | 1 refills | Status: DC

## 2019-04-05 NOTE — Telephone Encounter (Signed)
Given to pharmacist

## 2019-04-05 NOTE — Telephone Encounter (Signed)
Pt has a new pharm [facility]. Pt needs alprazolam 1 mg #90  and meclizine 12.5 mg # 270

## 2019-04-05 NOTE — Addendum Note (Signed)
Addended by: Biagio Borg on: 04/05/2019 09:09 AM   Modules accepted: Orders

## 2019-04-05 NOTE — Telephone Encounter (Signed)
Ok for chronic low back pain code:  M54.41

## 2019-04-05 NOTE — Telephone Encounter (Signed)
Done erx 

## 2019-04-12 IMAGING — CT CT CHEST W/O CM
2 of 4 series · 15 of 36 positions shown, 18 images · non-contrast
Comparison: No comparison studies available.

CLINICAL DATA: Rib and lower chest pain after a fall several days
ago.

EXAM:
CT CHEST WITHOUT CONTRAST
TECHNIQUE: Multidetector CT imaging of the chest was performed following the
standard protocol without IV contrast.

[Series 2: chest w/o st · axial · non-contrast · 0.62mm/px · z∈[+1518,+1730]mm · 12 of 126 slices shown, 15 images]
[im 10/126  mediastinal]
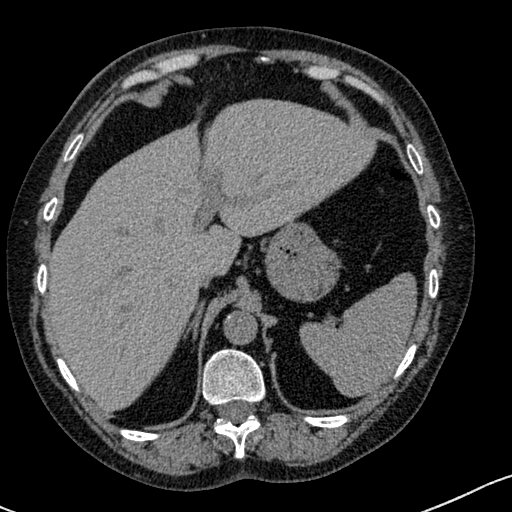
[im 10/126  lung]
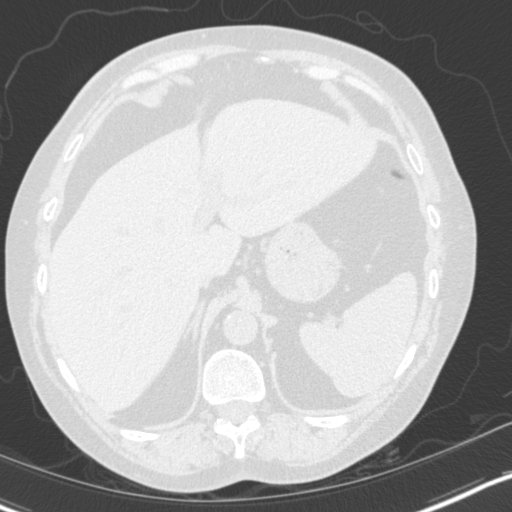
[im 20/126  lung]
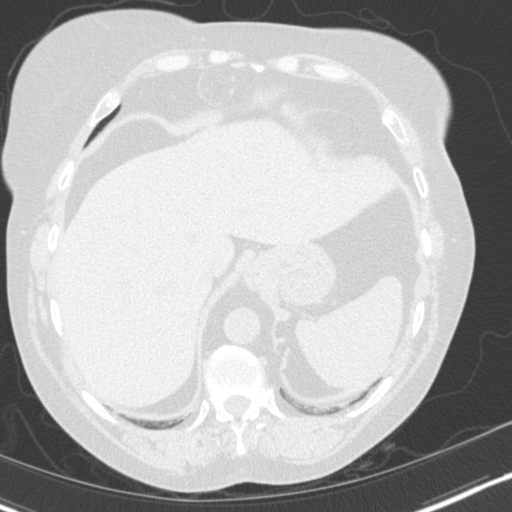
[im 29/126  lung]
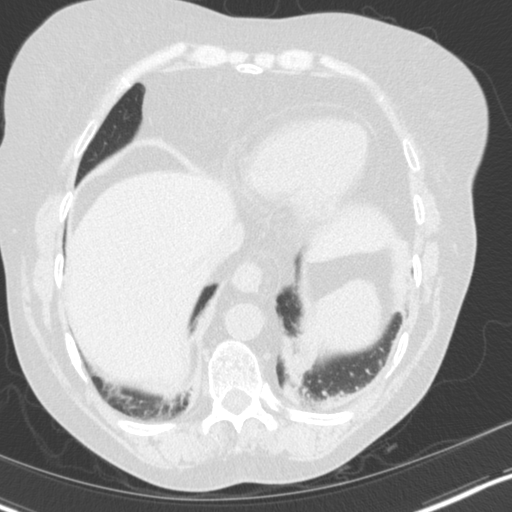
[im 39/126  lung]
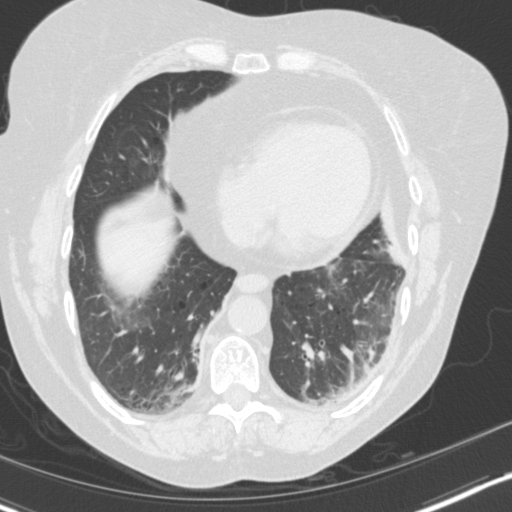
[im 49/126  mediastinal]
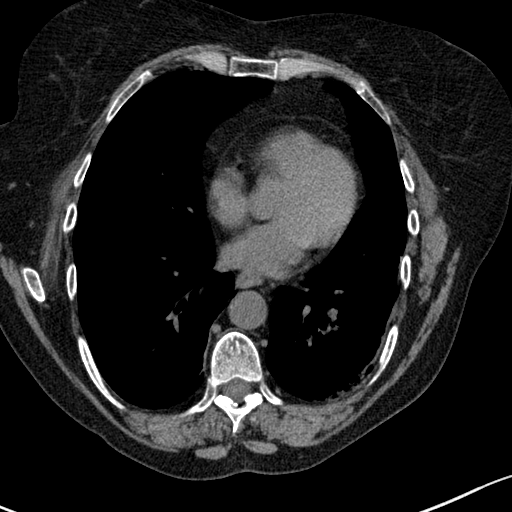
[im 49/126  lung]
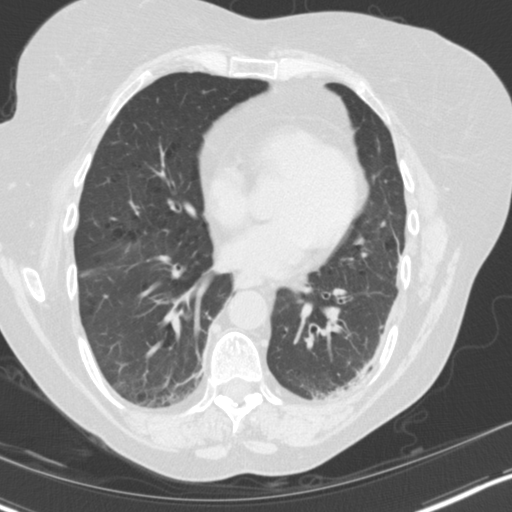
[im 58/126  lung]
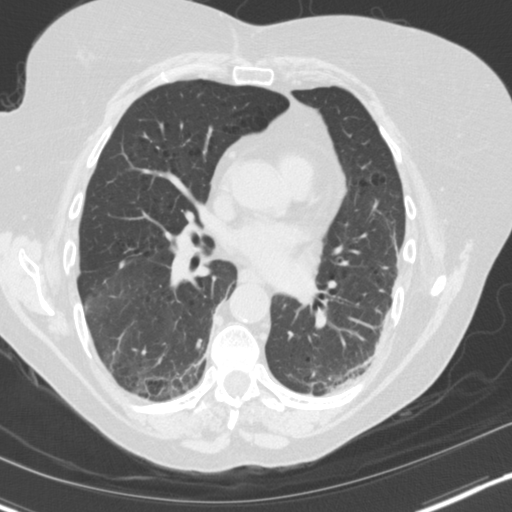
[im 68/126  lung]
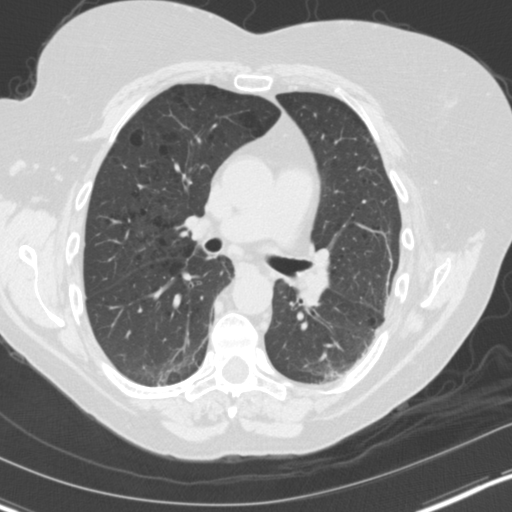
[im 77/126  lung]
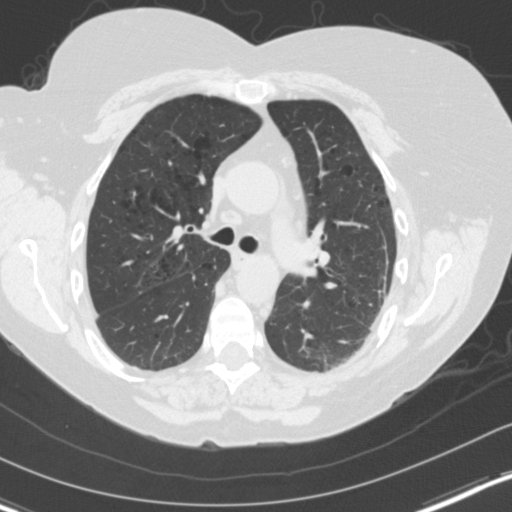
[im 87/126  mediastinal]
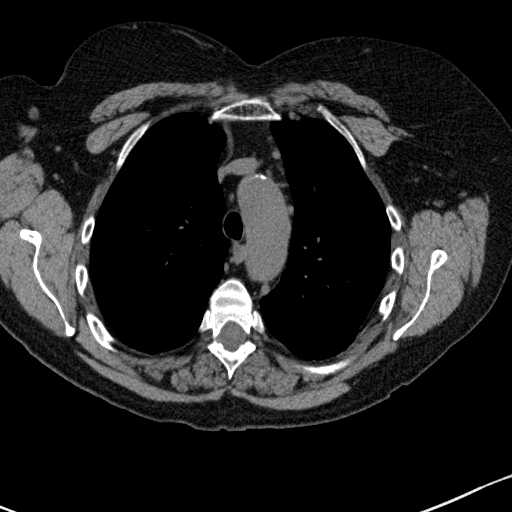
[im 87/126  lung]
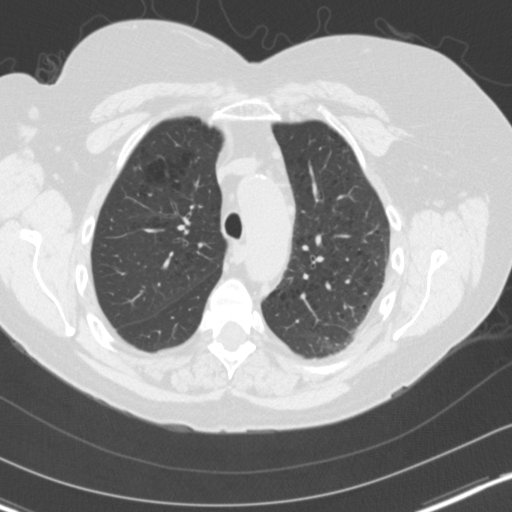
[im 97/126  lung]
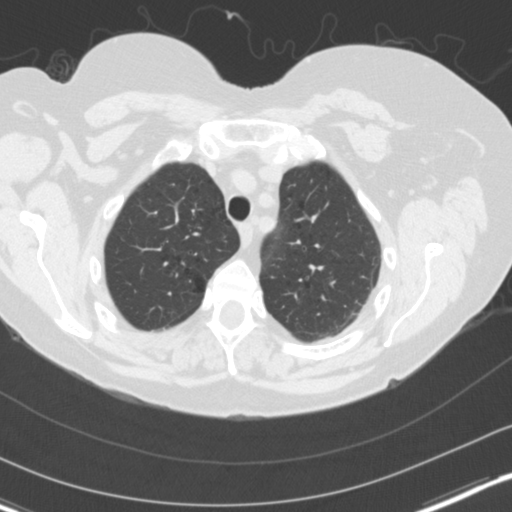
[im 106/126  lung]
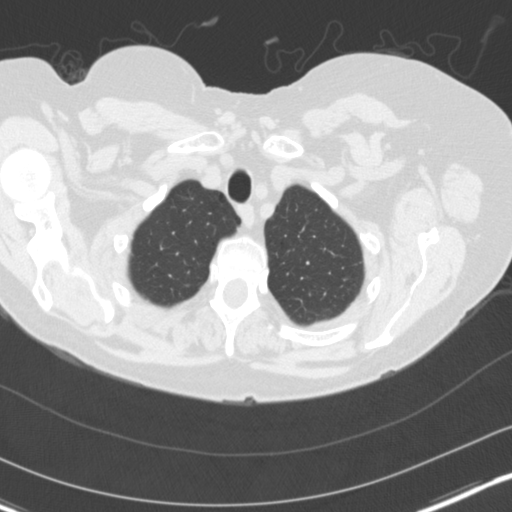
[im 116/126  lung]
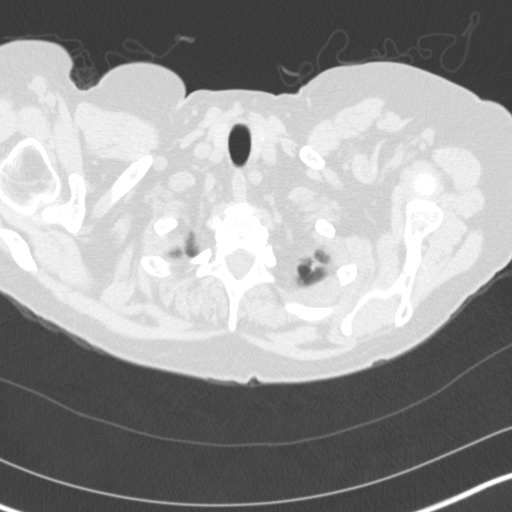

[Series 3: coronals · coronal · 0.59mm/px · 3 of 151 slices shown]
[im 31/151  lung]
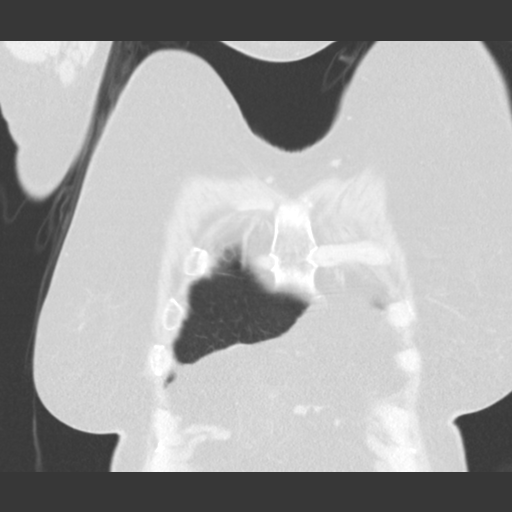
[im 61/151  lung]
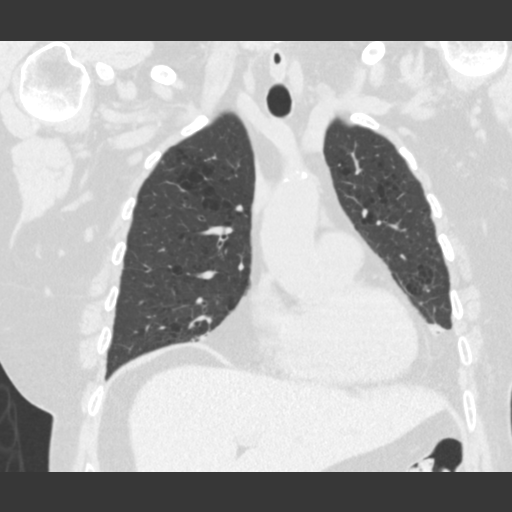
[im 91/151  lung]
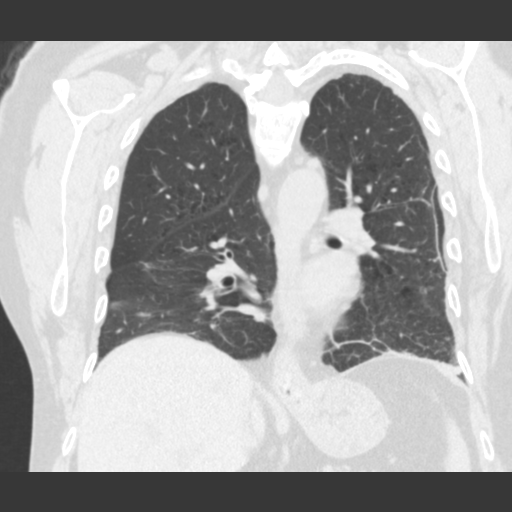

[15 of 36 positions shown; findings below may reference images not displayed]

FINDINGS: Cardiovascular: The heart size is normal. No pericardial effusion.
Coronary artery calcification is noted. Atherosclerotic
calcification is noted in the wall of the thoracic aorta.

Mediastinum/Nodes: Scattered small lymph nodes evident. No evidence
for gross hilar lymphadenopathy although assessment is limited by
the lack of intravenous contrast on today's study. Tiny hiatal
hernia. The esophagus has normal imaging features. There is no
axillary lymphadenopathy.

Lungs/Pleura: Centrilobular emphysema. Areas of atelectasis in
linear scarring noted in the lower lungs bilaterally. No pulmonary
edema. No substantial pleural effusion.

Upper Abdomen: Unremarkable.

Musculoskeletal: Bone windows reveal no worrisome lytic or sclerotic
osseous lesions. No evidence for an acute rib fracture.
IMPRESSION: 1. Emphysema with lower lung atelectasis and scarring. No
pneumothorax.
2. No evidence for rib fracture.

## 2019-04-12 IMAGING — CR DG CHEST 2V
2 series · 2 of 2 positions shown · non-contrast
Comparison: 01/31/2017 chest radiograph and prior studies

CLINICAL DATA: Chest and rib pain following fall.

EXAM:
CHEST  2 VIEW

[w chest lat]
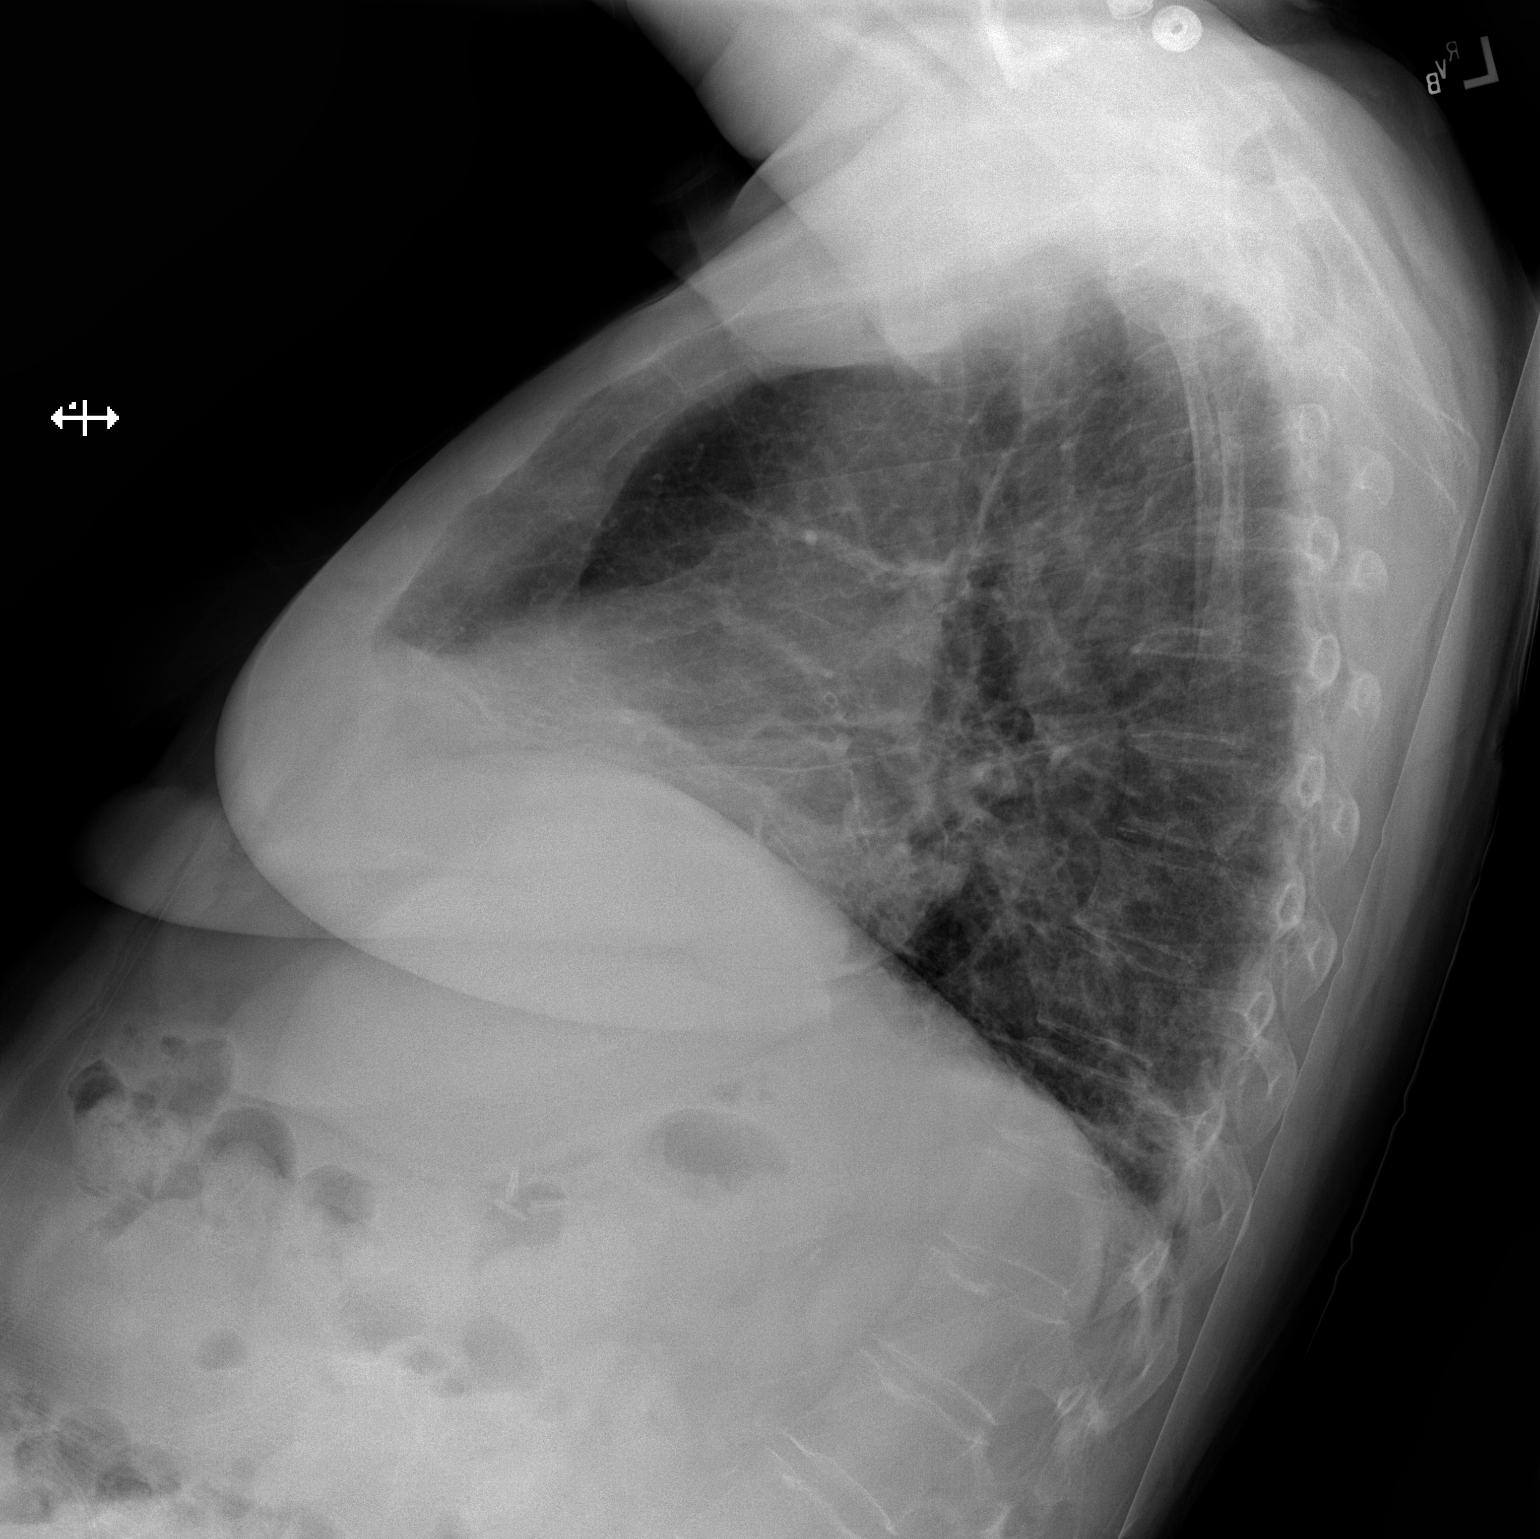

[x chest ap]
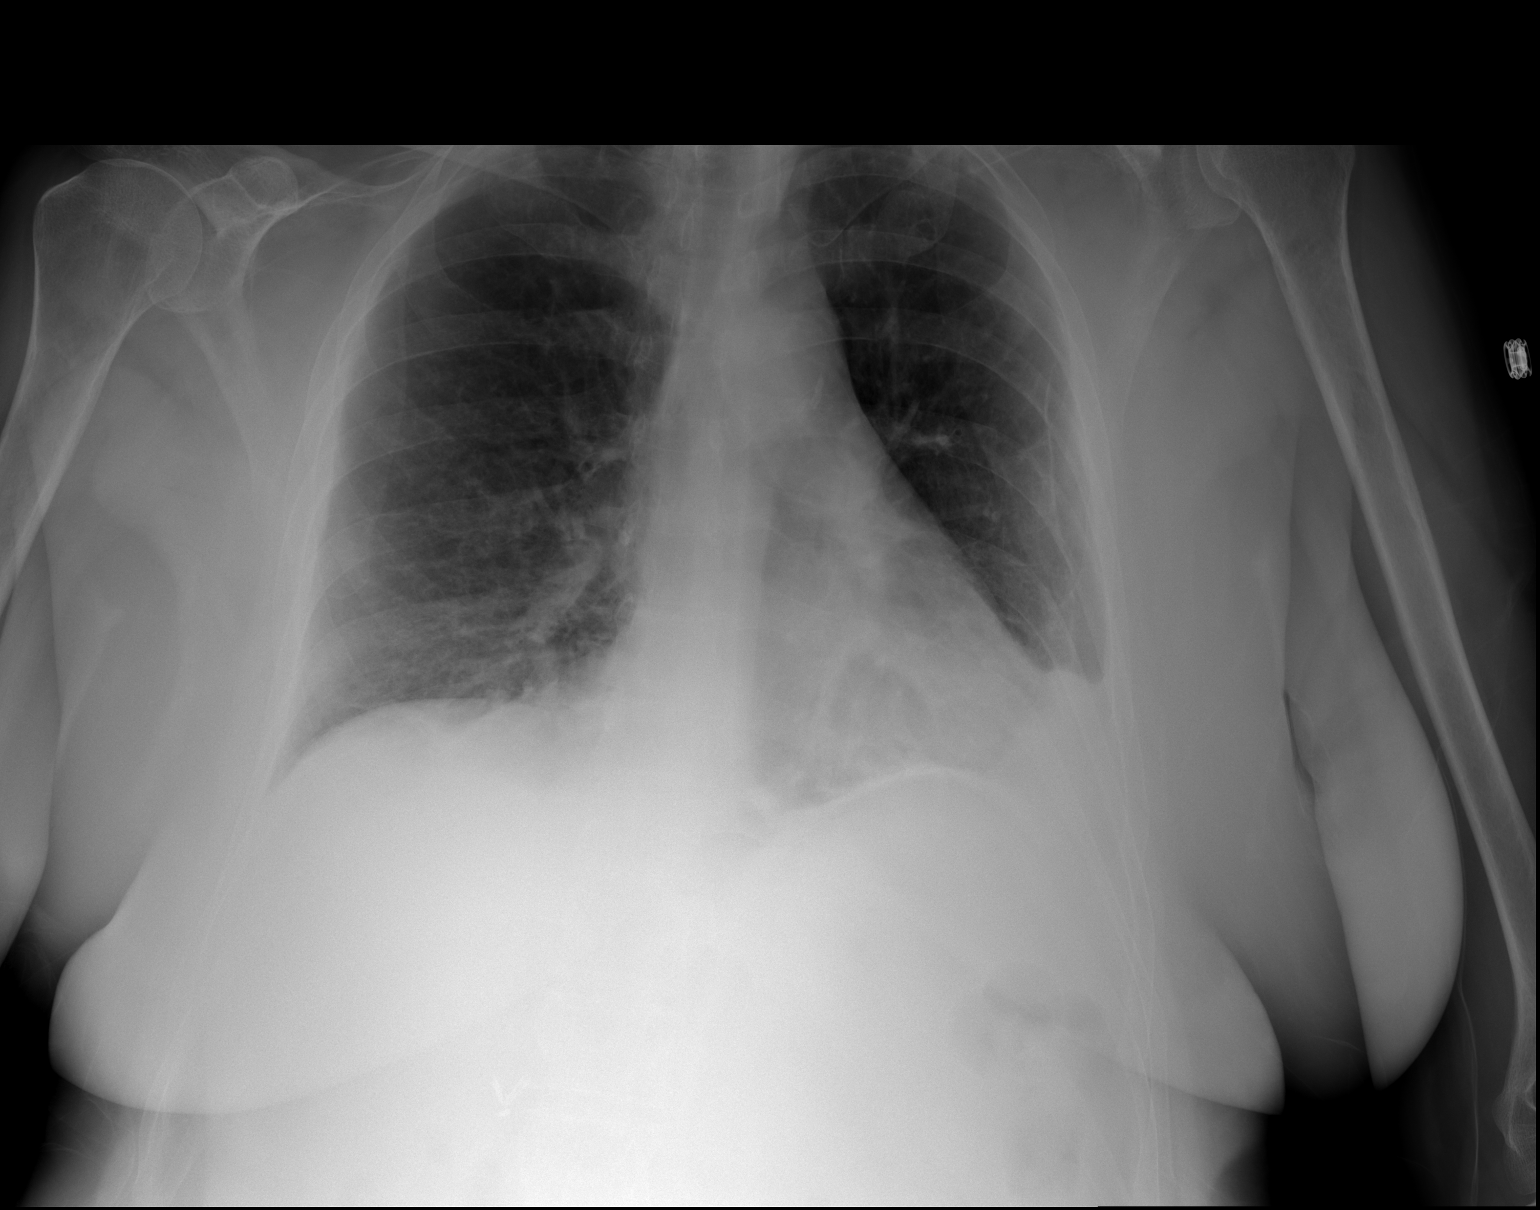

[2 of 2 positions shown; findings below may reference images not displayed]

FINDINGS: Increasing confluent opacity in the lateral left lower lung is
identified. Lucency along the lateral left hemithorax his noted.

The cardiomediastinal silhouette is unremarkable.

Mild chronic peribronchial thickening noted.

There is no definite rib fracture.

No other interval change.
IMPRESSION: Increasing confluent opacity at the lateral left lung base with
indeterminate and lucency along the lateral left hemithorax. A small
left lateral pneumothorax is not entirely excluded. Consider CT for
further evaluation.

## 2019-04-14 ENCOUNTER — Ambulatory Visit (INDEPENDENT_AMBULATORY_CARE_PROVIDER_SITE_OTHER): Payer: Medicare Other

## 2019-04-14 DIAGNOSIS — J309 Allergic rhinitis, unspecified: Secondary | ICD-10-CM | POA: Diagnosis not present

## 2019-04-20 ENCOUNTER — Telehealth: Payer: Self-pay | Admitting: Internal Medicine

## 2019-04-20 MED ORDER — MONTELUKAST SODIUM 10 MG PO TABS: 10.0000 mg | ORAL_TABLET | Freq: Every day | ORAL | 1 refills | Status: DC

## 2019-04-20 NOTE — Telephone Encounter (Signed)
Patient requesting singulair to be sent to mail order pharmacy.

## 2019-04-21 ENCOUNTER — Encounter: Payer: Self-pay | Admitting: Internal Medicine

## 2019-04-21 ENCOUNTER — Other Ambulatory Visit (INDEPENDENT_AMBULATORY_CARE_PROVIDER_SITE_OTHER): Payer: Medicare Other

## 2019-04-21 ENCOUNTER — Ambulatory Visit (INDEPENDENT_AMBULATORY_CARE_PROVIDER_SITE_OTHER): Payer: Medicare Other | Admitting: Internal Medicine

## 2019-04-21 ENCOUNTER — Ambulatory Visit (INDEPENDENT_AMBULATORY_CARE_PROVIDER_SITE_OTHER)
Admission: RE | Admit: 2019-04-21 | Discharge: 2019-04-21 | Disposition: A | Discharge status: Home / Self Care | Payer: Medicare Other | Source: Ambulatory Visit | Attending: Internal Medicine | Admitting: Internal Medicine

## 2019-04-21 ENCOUNTER — Other Ambulatory Visit: Payer: Self-pay

## 2019-04-21 ENCOUNTER — Other Ambulatory Visit: Payer: Medicare Other

## 2019-04-21 ENCOUNTER — Other Ambulatory Visit: Payer: Self-pay | Admitting: Internal Medicine

## 2019-04-21 VITALS — BP 118/82 | HR 75 | Temp 97.9°F | Ht 62.6 in | Wt 129.0 lb

## 2019-04-21 DIAGNOSIS — E611 Iron deficiency: Secondary | ICD-10-CM

## 2019-04-21 DIAGNOSIS — M25562 Pain in left knee: Secondary | ICD-10-CM | POA: Insufficient documentation

## 2019-04-21 DIAGNOSIS — M545 Low back pain, unspecified: Secondary | ICD-10-CM

## 2019-04-21 DIAGNOSIS — M5441 Lumbago with sciatica, right side: Secondary | ICD-10-CM

## 2019-04-21 DIAGNOSIS — E538 Deficiency of other specified B group vitamins: Secondary | ICD-10-CM

## 2019-04-21 DIAGNOSIS — F411 Generalized anxiety disorder: Secondary | ICD-10-CM

## 2019-04-21 DIAGNOSIS — E785 Hyperlipidemia, unspecified: Secondary | ICD-10-CM

## 2019-04-21 DIAGNOSIS — R030 Elevated blood-pressure reading, without diagnosis of hypertension: Secondary | ICD-10-CM | POA: Diagnosis not present

## 2019-04-21 DIAGNOSIS — E559 Vitamin D deficiency, unspecified: Secondary | ICD-10-CM | POA: Diagnosis not present

## 2019-04-21 DIAGNOSIS — J449 Chronic obstructive pulmonary disease, unspecified: Secondary | ICD-10-CM | POA: Diagnosis not present

## 2019-04-21 DIAGNOSIS — R739 Hyperglycemia, unspecified: Secondary | ICD-10-CM | POA: Diagnosis not present

## 2019-04-21 DIAGNOSIS — F339 Major depressive disorder, recurrent, unspecified: Secondary | ICD-10-CM

## 2019-04-21 DIAGNOSIS — G8929 Other chronic pain: Secondary | ICD-10-CM

## 2019-04-21 DIAGNOSIS — M1812 Unilateral primary osteoarthritis of first carpometacarpal joint, left hand: Secondary | ICD-10-CM | POA: Diagnosis not present

## 2019-04-21 DIAGNOSIS — S3992XA Unspecified injury of lower back, initial encounter: Secondary | ICD-10-CM | POA: Diagnosis not present

## 2019-04-21 LAB — URINALYSIS, ROUTINE W REFLEX MICROSCOPIC
Bilirubin Urine: NEGATIVE
Hgb urine dipstick: NEGATIVE
Ketones, ur: NEGATIVE
Leukocytes,Ua: NEGATIVE
Nitrite: NEGATIVE
RBC / HPF: NONE SEEN (ref 0–?)
Specific Gravity, Urine: 1.015 (ref 1.000–1.030)
Total Protein, Urine: NEGATIVE
Urine Glucose: NEGATIVE
Urobilinogen, UA: 0.2 (ref 0.0–1.0)
WBC, UA: NONE SEEN (ref 0–?)
pH: 5.5 (ref 5.0–8.0)

## 2019-04-21 LAB — HEPATIC FUNCTION PANEL
ALT: 9 U/L (ref 0–35)
AST: 11 U/L (ref 0–37)
Albumin: 4.3 g/dL (ref 3.5–5.2)
Alkaline Phosphatase: 85 U/L (ref 39–117)
Bilirubin, Direct: 0 mg/dL (ref 0.0–0.3)
Total Bilirubin: 0.4 mg/dL (ref 0.2–1.2)
Total Protein: 6.5 g/dL (ref 6.0–8.3)

## 2019-04-21 LAB — BASIC METABOLIC PANEL
BUN: 7 mg/dL (ref 6–23)
CO2: 25 mEq/L (ref 19–32)
Calcium: 9.1 mg/dL (ref 8.4–10.5)
Chloride: 104 mEq/L (ref 96–112)
Creatinine, Ser: 0.95 mg/dL (ref 0.40–1.20)
GFR: 59.47 mL/min — ABNORMAL LOW (ref >60.00)
Glucose, Bld: 86 mg/dL (ref 70–99)
Potassium: 3.8 mEq/L (ref 3.5–5.1)
Sodium: 137 mEq/L (ref 135–145)

## 2019-04-21 LAB — IBC PANEL
Iron: 41 ug/dL — ABNORMAL LOW (ref 42–145)
Saturation Ratios: 10.2 % — ABNORMAL LOW (ref 20.0–50.0)
Transferrin: 288 mg/dL (ref 212.0–360.0)

## 2019-04-21 LAB — CBC WITH DIFFERENTIAL/PLATELET
Basophils Absolute: 0 10*3/uL (ref 0.0–0.1)
Basophils Relative: 0.6 % (ref 0.0–3.0)
Eosinophils Absolute: 0 10*3/uL (ref 0.0–0.7)
Eosinophils Relative: 0 % (ref 0.0–5.0)
HCT: 38.8 % (ref 36.0–46.0)
Hemoglobin: 13 g/dL (ref 12.0–15.0)
Lymphocytes Relative: 45 % (ref 12.0–46.0)
Lymphs Abs: 3.4 10*3/uL (ref 0.7–4.0)
MCHC: 33.4 g/dL (ref 30.0–36.0)
MCV: 82.7 fl (ref 78.0–100.0)
Monocytes Absolute: 0.5 10*3/uL (ref 0.1–1.0)
Monocytes Relative: 7.3 % (ref 3.0–12.0)
Neutro Abs: 3.6 10*3/uL (ref 1.4–7.7)
Neutrophils Relative %: 47.1 % (ref 43.0–77.0)
Platelets: 282 10*3/uL (ref 150.0–400.0)
RBC: 4.69 Mil/uL (ref 3.87–5.11)
RDW: 15.2 % (ref 11.5–15.5)
WBC: 7.5 10*3/uL (ref 4.0–10.5)

## 2019-04-21 LAB — LIPID PANEL
Cholesterol: 225 mg/dL — ABNORMAL HIGH (ref 0–200)
HDL: 31.1 mg/dL — ABNORMAL LOW (ref >39.00)
NonHDL: 194.1
Total CHOL/HDL Ratio: 7
Triglycerides: 365 mg/dL — ABNORMAL HIGH (ref 0.0–149.0)
VLDL: 73 mg/dL — ABNORMAL HIGH (ref 0.0–40.0)

## 2019-04-21 LAB — VITAMIN D 25 HYDROXY (VIT D DEFICIENCY, FRACTURES): VITD: 12.58 ng/mL — ABNORMAL LOW (ref 30.00–100.00)

## 2019-04-21 LAB — TSH: TSH: 2.32 u[IU]/mL (ref 0.35–4.50)

## 2019-04-21 LAB — VITAMIN B12: Vitamin B-12: >1500 pg/mL — ABNORMAL HIGH (ref 211–911)

## 2019-04-21 LAB — LDL CHOLESTEROL, DIRECT: Direct LDL: 146 mg/dL

## 2019-04-21 LAB — HEMOGLOBIN A1C: Hgb A1c MFr Bld: 5.5 % (ref 4.6–6.5)

## 2019-04-21 MED ORDER — ATORVASTATIN CALCIUM 20 MG PO TABS: 20.0000 mg | ORAL_TABLET | Freq: Every day | ORAL | 3 refills | Status: DC

## 2019-04-21 MED ORDER — VITAMIN D (ERGOCALCIFEROL) 1.25 MG (50000 UNIT) PO CAPS: 50000.0000 [IU] | ORAL_CAPSULE | ORAL | 0 refills | Status: DC

## 2019-04-21 MED ORDER — CYANOCOBALAMIN 1000 MCG/ML IJ SOLN
1000.0000 ug | Freq: Once | INTRAMUSCULAR | Status: AC
  Administered 2019-04-21: 1000 ug via INTRAMUSCULAR

## 2019-04-21 MED ORDER — POLYSACCHARIDE IRON COMPLEX 150 MG PO CAPS: 150.0000 mg | ORAL_CAPSULE | Freq: Every day | ORAL | 1 refills | Status: DC

## 2019-04-21 MED ORDER — ALPRAZOLAM 1 MG PO TABS: ORAL_TABLET | ORAL | 5 refills | Status: DC

## 2019-04-21 MED ORDER — TRAMADOL HCL 50 MG PO TABS: 50.0000 mg | ORAL_TABLET | Freq: Four times a day (QID) | ORAL | 1 refills | Status: DC | PRN

## 2019-04-21 MED ORDER — CITALOPRAM HYDROBROMIDE 10 MG PO TABS: 10.0000 mg | ORAL_TABLET | Freq: Every day | ORAL | 3 refills | Status: DC

## 2019-04-21 NOTE — Assessment & Plan Note (Signed)
To start celexa 10 qd, f/u 3 mo or sooner if needed, declines counseling referral

## 2019-04-21 NOTE — Assessment & Plan Note (Signed)
stable overall by history and exam, recent data reviewed with pt, and pt to continue medical treatment as before,  to f/u any worsening symptoms or concerns  

## 2019-04-21 NOTE — Assessment & Plan Note (Signed)
With acute flare it seems, for LS spine film with recent fall

## 2019-04-21 NOTE — Assessment & Plan Note (Signed)
For f/u lipids today on new statin, goal < 100

## 2019-04-21 NOTE — Assessment & Plan Note (Addendum)
Ok for xanax bid prn - done erx  Note:  Total time for pt hx, exam, review of record with pt in the room, determination of diagnoses and plan for further eval and tx is > 40 min, with over 50% spent in coordination and counseling of patient including the differential dx, tx, further evaluation and other management of anxiety, depression, left knee pain, low back pain, hyperglycemia, HLD, asthma/copd, HTN, b12 deficiency

## 2019-04-21 NOTE — Assessment & Plan Note (Signed)
For b12 IM 100 mg replacement today

## 2019-04-21 NOTE — Patient Instructions (Signed)
You had the B12 shot today  Please take all new medication as prescribed - the celexa 10 mg per day  Please continue all other medications as before, and refills have been done if requested for the xanax and tramadol  Please have the pharmacy call with any other refills you may need.  Please continue your efforts at being more active, low cholesterol diet, and weight control.  You will be contacted regarding the referral for: Dr Tamala Julian for the back and left knee  Please keep your appointments with your specialists as you may have planned  Please go to the XRAY Department in the Basement (go straight as you get off the elevator) for the x-ray testing  Please go to the LAB in the Basement (turn left off the elevator) for the tests to be done today  You will be contacted by phone if any changes need to be made immediately.  Otherwise, you will receive a letter about your results with an explanation, but please check with MyChart first.  Please remember to sign up for MyChart if you have not done so, as this will be important to you in the future with finding out test results, communicating by private email, and scheduling acute appointments online when needed.  Please return in 3 months, or sooner if needed

## 2019-04-21 NOTE — Assessment & Plan Note (Signed)
With post traumatic effusion vs DJD flare - for pain control, refer Dr Tamala Julian - sports medicine

## 2019-04-21 NOTE — Progress Notes (Signed)
Subjective:    Patient ID: Lori [name], female    DOB: [birth year], 63 y.o.   MRN: [id number]  HPI    Here to f/u; overall doing ok,  Pt denies chest pain, increasing sob or doe, wheezing, orthopnea, PND, increased LE swelling, palpitations, dizziness or syncope.  Pt denies new neurological symptoms such as new headache, or facial or extremity weakness or numbness.  Pt denies polydipsia, polyuria, or low sugar episode.  Pt states overall good compliance with meds, mostly trying to follow appropriate diet, with wt overall stable,  but little exercise however.  Tolerating new statin ok.  Due for B12 shot today for replacement Also c/o moderate worsening depressive symptoms, without suicidal ideation, or panic; has ongoing anxiety, much increased recently in the last 2 wks as husband becoming more erratic and hard to tolerate behavior though not abusive. Also c/o recent fall 2 days ago with acute onset moderate right lower and midline low back pain with some radiation to the right lateral hip area but not lower and no LE worsening pain, numb or weakness.   Did also strike left knee with a bruise and swelling since then, has known end stage left knee DJD and trying to avoid surgury, asks for pain med, not wanting to see ortho f/u for now Past Medical History:  Diagnosis Date  . Acute MI ([place])    x3 - by report only. She has had 3 negative Myoview stress test with no evidence of prior infarct.  . Allergic rhinitis   . Allergy   . Anemia   . Ankle fracture 05/2016  . Anxiety   . Arthritis   . Asthma   . Barrett esophagus 2007  . Chest pain 05-02-2009   echo  EF 55%  . COPD (chronic obstructive pulmonary disease) with chronic bronchitis (HCC)    & Emphysema  . Depression   . Duodenitis without mention of hemorrhage 2007  . Esophageal reflux 2007  . Esophageal stricture   . Full dentures   . H/O hiatal hernia   . Hiatal hernia 1017,5102  . Hyperlipidemia   . Multiple fractures    from  falls, fx rt. elbow, fx left wrist, bilateral ankles  . Pneumonia 10/2016  . Restrictive lung disease 06/30/2017  . Ulcer    Past Surgical History:  Procedure Laterality Date  . ABDOMINAL HYSTERECTOMY     BSO  . APPENDECTOMY    . CESAREAN SECTION     x 2  . CHOLECYSTECTOMY    . CHONDROPLASTY Left 01/06/2015   Procedure: CHONDROPLASTY;  Surgeon: Marybelle Killings, MD;  Location: [facility];  Service: Orthopedics;  Laterality: Left;  . COLONOSCOPY    . ELBOW FRACTURE SURGERY     right  . KNEE ARTHROSCOPY WITH EXCISION PLICA Left 5/85/2778   Procedure: KNEE ARTHROSCOPY WITH EXCISION PLICA;  Surgeon: Marybelle Killings, MD;  Location: [facility];  Service: Orthopedics;  Laterality: Left;  . Lower Extremity Arterial Dopplers  07/06/2013   RABI 1.0, LABI 1.1.; No evidence of significant vascular atherogenic plaque  . NECK SURGERY    . NM MYOVIEW LTD  09/2014   LOW RISK. Normal EF of 65% with no regional wall motion abnormalities. No ischemia or infarction.  . TONSILLECTOMY    . WRIST FRACTURE SURGERY     left, has plate    reports that she has quit smoking. Her smoking use included cigarettes. She started smoking about 41 years ago. She  has a 39.00 pack-year smoking history. She has never used smokeless tobacco. She reports that she does not drink alcohol or use drugs. family history includes Dementia in her mother; Diabetes in her maternal aunt, maternal grandmother, and another family member; Emphysema in her father; Heart disease in her brother. Allergies  Allergen Reactions  . Bee Venom Swelling  . Codeine Nausea Only and Other (See Comments)    CAUSES ULCERS  . Ibuprofen Nausea And Vomiting  . Clonidine Derivatives Other (See Comments)    Unknown   . Tramadol Nausea Only  . Latex Rash  . Levofloxacin Nausea Only  . Propoxyphene N-Acetaminophen Nausea Only   Current Outpatient Medications on File Prior to Visit  Medication Sig Dispense Refill  . DEXILANT  60 MG capsule TAKE 1 CAPSULE(60 MG) BY MOUTH DAILY 90 capsule 1  . EPINEPHrine 0.3 mg/0.3 mL IJ SOAJ injection INJECT AS DIRECTED FOR SEVERE ALLERGIC REACTION 2 each 2  . FASENRA 30 MG/ML SOSY INJECT 30MG  SUBCUTANEOUSLY  EVERY 8 WEEKS (GIVEN AT  PRESCRIBERS OFFICE) 1 mL 8  . gabapentin (NEURONTIN) 100 MG capsule Take 1 capsule (100 mg total) by mouth 3 (three) times daily. 270 capsule 1  . meclizine (ANTIVERT) 12.5 MG tablet Take 1 tablet (12.5 mg total) by mouth 3 (three) times daily as needed for dizziness. 270 tablet 1  . montelukast (SINGULAIR) 10 MG tablet Take 1 tablet (10 mg total) by mouth at bedtime. 90 tablet 1  . nitroGLYCERIN (NITROSTAT) 0.4 MG SL tablet PLACE 1 TABLET UNDER THE TONGUE EVERY 5 MINUTES AS NEEDED FOR CHEST PAIN. 25 tablet 1  . ondansetron (ZOFRAN) 4 MG tablet Take 1 tablet (4 mg total) by mouth every 8 (eight) hours as needed for nausea or vomiting. 30 tablet 0  . predniSONE (DELTASONE) 10 MG tablet 3 tabs by mouth per day for 3 days,2tabs per day for 3 days,1tab per day for 3 days 18 tablet 0  . PROAIR HFA 108 (90 Base) MCG/ACT inhaler Inhale 1-2 puffs into the lungs every 4 (four) hours as needed for shortness of breath or wheezing. 54 g 0  . promethazine-dextromethorphan (PROMETHAZINE-DM) 6.25-15 MG/5ML syrup Take 5 mLs by mouth 4 (four) times daily as needed for cough. 118 mL 1  . rosuvastatin (CRESTOR) 20 MG tablet Take 1 tablet (20 mg total) by mouth daily. 90 tablet 3  . valACYclovir (VALTREX) 500 MG tablet TAKE 1 TABLET(500 MG) BY MOUTH DAILY 30 tablet 5  . mometasone-formoterol (DULERA) 200-5 MCG/ACT AERO Inhale 2 puffs into the lungs daily for 30 days. 39 g 1   Current Facility-Administered Medications on File Prior to Visit  Medication Dose Route Frequency Provider Last Rate Last Dose  . Benralizumab SOSY 30 mg  30 mg Subcutaneous Q8 Toney Reil, MD   30 mg at 03/22/19 0843  . methylPREDNISolone acetate (DEPO-MEDROL) injection 40 mg  40 mg Other  Once Magnus Sinning, MD       Review of Systems  Constitutional: Negative for other unusual diaphoresis or sweats HENT: Negative for ear discharge or swelling Eyes: Negative for other worsening visual disturbances Respiratory: Negative for stridor or other swelling  Gastrointestinal: Negative for worsening distension or other blood Genitourinary: Negative for retention or other urinary change Musculoskeletal: Negative for other MSK pain or swelling Skin: Negative for color change or other new lesions Neurological: Negative for worsening tremors and other numbness  Psychiatric/Behavioral: Negative for worsening agitation or other fatigue All other system neg per pt  Objective:   Physical Exam BP 118/82   Pulse 75   Temp 97.9 F (36.6 C) (Oral)   Ht 5' 2.6" (1.59 m)   Wt 129 lb (58.5 kg)   SpO2 98%   BMI 23.14 kg/m  VS noted,  Constitutional: Pt appears in NAD HENT: Head: NCAT.  Right Ear: External ear normal.  Left Ear: External ear normal.  Eyes: . Pupils are equal, round, and reactive to light. Conjunctivae and EOM are normal Nose: without d/c or deformity Neck: Neck supple. Gross normal ROM Cardiovascular: Normal rate and regular rhythm.   Pulmonary/Chest: Effort normal and breath sounds without rales or wheezing.  Abd:  Soft, NT, ND, + BS, no organomegaly Spine nontender to palpate in midline, + right lumbar paravertebral tender Left knee with 1+ effusion, nontender but reduced ROM Neurological: Pt is alert. At baseline orientation, motor grossly intact Skin: Skin is warm. No rashes, other new lesions, no LE edema Psychiatric: Pt behavior is normal without agitation though anxiety/depressed affect and mood No other exam findings Lab Results  Component Value Date   WBC 10.2 12/28/2018   HGB 13.3 12/28/2018   HCT 40.6 12/28/2018   PLT 294 12/28/2018   GLUCOSE 106 (H) 12/28/2018   CHOL 282 (H) 10/20/2018   TRIG 332.0 (H) 10/20/2018   HDL 42.50 10/20/2018    LDLDIRECT 201.0 10/20/2018   LDLCALC Comment 03/12/2017   ALT 13 12/28/2018   AST 16 12/28/2018   NA 137 12/28/2018   K 4.2 12/28/2018   CL 107 12/28/2018   CREATININE 0.91 12/28/2018   BUN 9 12/28/2018   CO2 22 12/28/2018   TSH 2.41 10/20/2018   INR 0.9 08/31/2008   HGBA1C 5.5 06/21/2016         Assessment & Plan:

## 2019-04-23 ENCOUNTER — Telehealth: Payer: Self-pay

## 2019-04-23 NOTE — Telephone Encounter (Signed)
-----   Message from Biagio Borg, MD sent at 04/21/2019  1:16 PM EDT ----- Left message on MyChart, pt to cont same tx except  The test results show that your current treatment is OK, except the Vitamin D level is low, the iron level is low, and the LDL cholesterol is high.   We need to: 1)  Please take Vitamin D 50000 units weekly for 12 weeks, then plan to change to OTC Vitamin D3 at 2000 units per day, indefinitely. 2)  Take Nu-iron 1 tab per day 3)  Refer to Gastroenterology for iron deficiency 4)  Start lipitor 20 mg per day and lower cholesterol diet

## 2019-04-23 NOTE — Telephone Encounter (Signed)
Called pt, LVM.   CRM created.  

## 2019-04-23 NOTE — Telephone Encounter (Signed)
Ok to try immodium otc prn

## 2019-04-23 NOTE — Telephone Encounter (Signed)
Copied from [name] [id number]. Topic: General - Other >> Apr 23, 2019  2:44 PM Lori [name] wrote: Patient requesting call back from [place] to discuss diarrhea and possibly having a medication sent to pharmacy.

## 2019-04-26 NOTE — Telephone Encounter (Signed)
Called pt, LVM.   

## 2019-04-27 ENCOUNTER — Telehealth: Payer: Self-pay | Admitting: Internal Medicine

## 2019-04-27 MED ORDER — POLYSACCHARIDE IRON COMPLEX 150 MG PO CAPS: 150.0000 mg | ORAL_CAPSULE | Freq: Every day | ORAL | 1 refills | Status: DC

## 2019-04-27 MED ORDER — CITALOPRAM HYDROBROMIDE 10 MG PO TABS: 10.0000 mg | ORAL_TABLET | Freq: Every day | ORAL | 3 refills | Status: DC

## 2019-04-27 MED ORDER — ATORVASTATIN CALCIUM 20 MG PO TABS: 20.0000 mg | ORAL_TABLET | Freq: Every day | ORAL | 3 refills | Status: DC

## 2019-04-27 NOTE — Telephone Encounter (Signed)
Maintenance meds were sent to [facility] but the Alprazolam and Tramadol were not due to them being controlled substances and not allowed to be filled over a quantity of 30#.

## 2019-04-27 NOTE — Telephone Encounter (Signed)
Pt's results released to MyChart, states she has not received.  Results reviewed with patient; verbalizes understanding. Pt would like prescriptions sent to [facility]. Ph# [phone]; aware they were sent to [facility] on [address]. Request change due to cost.   Also requests copy of all lab work and X Rays be sent to her home address. Address verified.

## 2019-04-27 NOTE — Addendum Note (Signed)
Addended by: Juliet Rude on: 04/27/2019 01:50 PM   Modules accepted: Orders

## 2019-05-04 ENCOUNTER — Ambulatory Visit (INDEPENDENT_AMBULATORY_CARE_PROVIDER_SITE_OTHER): Payer: Medicare Other | Admitting: *Deleted

## 2019-05-04 DIAGNOSIS — J309 Allergic rhinitis, unspecified: Secondary | ICD-10-CM | POA: Diagnosis not present

## 2019-05-06 ENCOUNTER — Encounter: Payer: Self-pay | Admitting: Orthopedic Surgery

## 2019-05-06 ENCOUNTER — Ambulatory Visit (INDEPENDENT_AMBULATORY_CARE_PROVIDER_SITE_OTHER): Payer: Medicare Other | Admitting: *Deleted

## 2019-05-06 ENCOUNTER — Ambulatory Visit (INDEPENDENT_AMBULATORY_CARE_PROVIDER_SITE_OTHER): Payer: Medicare Other | Admitting: Orthopedic Surgery

## 2019-05-06 DIAGNOSIS — M25562 Pain in left knee: Secondary | ICD-10-CM

## 2019-05-06 DIAGNOSIS — J4550 Severe persistent asthma, uncomplicated: Secondary | ICD-10-CM

## 2019-05-10 ENCOUNTER — Encounter: Payer: Self-pay | Admitting: Orthopedic Surgery

## 2019-05-10 NOTE — Progress Notes (Signed)
Office Visit Note   Patient: Lori [name]           Date of Birth: 08/19/1956           MRN: [id number] Visit Date: 05/06/2019 Requested by: Biagio Borg, MD [address] [place],  [place] [id number] PCP: Biagio Borg, MD  Subjective: Chief Complaint  Patient presents with  . Left Knee - Pain    HPI: Lori [name] is a 63 y.o. female who presents to the office complaining of left knee pain.  Patient states that she is fallen 5 times in the past 3 weeks.  She notes generalized left knee pain is worse over the joint lines. She notes pain is worse with weightbearing, stairs.   She reports mechanical symptoms of the left knee and low back pain but denies any radicular symptoms or left groin pain.  She does not walk with a cane or walker.  She has had intermittent left knee pain over the past couple years.  She is taking tramadol pain but she received from her primary care physician.  Patient's history includes COPD, asthma.  She denies any history of diabetes or blood clots.  She smokes 1/2 pack/day.              ROS:  All systems reviewed are negative as they relate to the chief complaint within the history of present illness.  Patient denies fevers or chills.  Assessment & Plan: Visit Diagnoses:  1. Left knee pain, unspecified chronicity     Plan: Patient is a 63 year old female who presents complaining of left knee pain that is been worse in the past 3 weeks since falling.  Patient's x-rays were reviewed and no acute findings were found; no fracture or dislocation.  Patient is having mechanical symptoms since a fall with tenderness along the joint line.  I am suspicious for meniscal pathology.  Ordered MRI of the left knee to evaluate for meniscal tear.  Patient given knee brace.  Patient will follow-up with the office after MRI to review results.  Follow-Up Instructions: No follow-ups on file.   Orders:  Orders Placed This Encounter  Procedures  . MR Knee Left w/o  contrast   No orders of the defined types were placed in this encounter.     Procedures: No procedures performed   Clinical Data: No additional findings.  Objective: Vital Signs: There were no vitals taken for this visit.  Physical Exam:  Constitutional: Patient appears well-developed HEENT:  Head: Normocephalic Eyes:EOM are normal Neck: Normal range of motion Cardiovascular: Normal rate Pulmonary/chest: Effort normal Neurologic: Patient is alert Skin: Skin is warm Psychiatric: Patient has normal mood and affect  Ortho Exam:  Left knee Exam Mild effusion Extensor mechanism intact No TTP quad tendon, patellar tendon, pes anserinus, patella, tibial tubercle, LCL/MCL insertions TTP over the medial and lateral joint lines Stable to varus/valgus stresses.  Stable to anterior/posterior drawer Extension to 0 degrees Flexion > 90 degrees  Specialty Comments:  No specialty comments available.  Imaging: No results found.   PMFS History: Patient Active Problem List   Diagnosis Date Noted  . Hyperglycemia 04/21/2019  . Left knee pain 04/21/2019  . Pain 12/29/2018  . B12 deficiency 10/20/2018  . Nausea 04/27/2018  . Anemia, iron deficiency 04/27/2018  . Intermittent paresthesia of hand and foot 04/27/2018  . Headache 03/21/2018  . Lumbar radiculitis 08/15/2017  . Degenerative disc disease, lumbar 07/21/2017  . Encounter for well adult  exam with abnormal findings 06/30/2017  . Epigastric pain 06/30/2017  . Restrictive lung disease 06/30/2017  . Leg cramping 06/09/2017  . Elbow contusion 06/09/2017  . Right elbow pain 05/27/2017  . Asthma-COPD overlap syndrome ([place]) 01/31/2017  . Dizziness 01/20/2017  . Seasonal and perennial allergic rhinitis 01/01/2017  . Moderate persistent asthma, uncomplicated [id number]  . Pulmonary emphysema ([place]) 12/03/2016  . Tobacco use disorder 12/03/2016  . Chronic seasonal allergic rhinitis 12/03/2016  . Medication management  11/29/2016  . Cough 11/23/2016  . Rash 11/23/2016  . Vertigo 10/22/2016  . Gastroesophageal reflux disease with esophagitis 10/22/2016  . Non compliance w medication regimen 08/01/2016  . Allergic contact dermatitis due to metals 08/01/2016  . Migraine without aura and without status migrainosus, not intractable 07/22/2016  . Spasm of muscle of lower back 06/21/2016  . Chronic rhinitis 06/19/2016  . Gait difficulty 06/21/2015  . Fibromyalgia 06/21/2015  . Conversion reaction 06/21/2015  . Depression 06/21/2015  . Syncope and collapse 06/21/2015  . Plica syndrome of left knee 01/06/2015  . Anxiety state 09/06/2014  . History of MI 09/06/2014  . Cigarette smoker 06/27/2013  . Borderline hypertension 06/27/2013  . Chronic low back pain with right-sided sciatica 06/14/2013  . Leg pain, bilateral 06/14/2013  . Gastroparesis 10/26/2009  . Musculoskeletal chest pain 05/02/2009  . Hyperlipidemia LDL goal <100 11/18/2008  . COPD exacerbation ([place]) 11/18/2008  . HEPATIC CYST 11/18/2008   Past Medical History:  Diagnosis Date  . Acute MI ([place])    x3 - by report only. She has had 3 negative Myoview stress test with no evidence of prior infarct.  . Allergic rhinitis   . Allergy   . Anemia   . Ankle fracture 05/2016  . Anxiety   . Arthritis   . Asthma   . Barrett esophagus 2007  . Chest pain 05-02-2009   echo  EF 55%  . COPD (chronic obstructive pulmonary disease) with chronic bronchitis (HCC)    & Emphysema  . Depression   . Duodenitis without mention of hemorrhage 2007  . Esophageal reflux 2007  . Esophageal stricture   . Full dentures   . H/O hiatal hernia   . Hiatal hernia [id number]  . Hyperlipidemia   . Multiple fractures    from falls, fx rt. elbow, fx left wrist, bilateral ankles  . Pneumonia 10/2016  . Restrictive lung disease 06/30/2017  . Ulcer     Family History  Problem Relation Age of Onset  . Emphysema Father   . Dementia Mother   . Diabetes Maternal  Grandmother   . Diabetes Maternal Aunt   . Diabetes Other        mat. cousin  . Heart disease Brother   . Colon cancer Neg Hx   . Allergic rhinitis Neg Hx   . Angioedema Neg Hx   . Asthma Neg Hx   . Atopy Neg Hx   . Eczema Neg Hx   . Immunodeficiency Neg Hx   . Urticaria Neg Hx     Past Surgical History:  Procedure Laterality Date  . ABDOMINAL HYSTERECTOMY     BSO  . APPENDECTOMY    . CESAREAN SECTION     x 2  . CHOLECYSTECTOMY    . CHONDROPLASTY Left 01/06/2015   Procedure: CHONDROPLASTY;  Surgeon: Marybelle Killings, MD;  Location: [facility];  Service: Orthopedics;  Laterality: Left;  . COLONOSCOPY    . ELBOW FRACTURE SURGERY     right  . KNEE  ARTHROSCOPY WITH EXCISION PLICA Left 0000000   Procedure: KNEE ARTHROSCOPY WITH EXCISION PLICA;  Surgeon: Marybelle Killings, MD;  Location: [facility];  Service: Orthopedics;  Laterality: Left;  . Lower Extremity Arterial Dopplers  07/06/2013   RABI 1.0, LABI 1.1.; No evidence of significant vascular atherogenic plaque  . NECK SURGERY    . NM MYOVIEW LTD  09/2014   LOW RISK. Normal EF of 65% with no regional wall motion abnormalities. No ischemia or infarction.  . TONSILLECTOMY    . WRIST FRACTURE SURGERY     left, has plate   Social History   Occupational History  . Occupation: disable  Tobacco Use  . Smoking status: Former Smoker    Packs/day: 1.00    Years: 39.00    Pack years: 39.00    Types: Cigarettes    Start date: 08/06/1977  . Smokeless tobacco: Never Used  . Tobacco comment: stopped 10/22.   Substance and Sexual Activity  . Alcohol use: No    Alcohol/week: 0.0 standard drinks  . Drug use: No  . Sexual activity: Never

## 2019-05-17 ENCOUNTER — Ambulatory Visit: Payer: Self-pay

## 2019-06-04 ENCOUNTER — Telehealth: Payer: Self-pay

## 2019-06-04 MED ORDER — ALPRAZOLAM 1 MG PO TABS: ORAL_TABLET | ORAL | 5 refills | Status: DC

## 2019-06-04 NOTE — Addendum Note (Signed)
Addended by: Biagio Borg on: 06/04/2019 02:46 PM   Modules accepted: Orders

## 2019-06-04 NOTE — Telephone Encounter (Signed)
Ok to take the xanax twice per day as needed,   I sent a new rx, but this is the most I can do

## 2019-06-04 NOTE — Telephone Encounter (Signed)
Called pt, LVM.   

## 2019-06-04 NOTE — Telephone Encounter (Signed)
Copied from [place] [id number]. Topic: General - Other >> Jun 04, 2019 12:16 PM Lori [name] wrote: Reason for CRM: Patient called to say that her husband passed away this past Wednesday 06/02/2019 and she would like an increase in her ALPRAZolam Duanne Moron) 1 MG tablet say that she is going through [name] rough time and need this medication to keep her going. Ph#  [phone]

## 2019-06-07 NOTE — Telephone Encounter (Signed)
Spoke with patient, she has been informed. &Understood.

## 2019-06-10 ENCOUNTER — Ambulatory Visit (INDEPENDENT_AMBULATORY_CARE_PROVIDER_SITE_OTHER): Payer: Medicare Other | Admitting: *Deleted

## 2019-06-10 ENCOUNTER — Other Ambulatory Visit: Payer: Self-pay

## 2019-06-10 ENCOUNTER — Ambulatory Visit
Admission: RE | Admit: 2019-06-10 | Discharge: 2019-06-10 | Disposition: A | Discharge status: Home / Self Care | Payer: Medicare Other | Source: Ambulatory Visit | Attending: Orthopedic Surgery | Admitting: Orthopedic Surgery

## 2019-06-10 DIAGNOSIS — J309 Allergic rhinitis, unspecified: Secondary | ICD-10-CM

## 2019-06-10 DIAGNOSIS — M23322 Other meniscus derangements, posterior horn of medial meniscus, left knee: Secondary | ICD-10-CM | POA: Diagnosis not present

## 2019-06-10 DIAGNOSIS — M25562 Pain in left knee: Secondary | ICD-10-CM

## 2019-06-11 ENCOUNTER — Telehealth: Payer: Self-pay | Admitting: Orthopedic Surgery

## 2019-06-11 NOTE — Telephone Encounter (Signed)
Patient called. She would like to have some pain meds sent to [facility] on [address].

## 2019-06-11 NOTE — Telephone Encounter (Signed)
Please advise. Thanks.  

## 2019-06-12 NOTE — Telephone Encounter (Signed)
No.  MRI scan pending.  Much confusion last time on her part about what pain medicine she has not has not had

## 2019-06-14 ENCOUNTER — Telehealth: Payer: Self-pay | Admitting: Internal Medicine

## 2019-06-14 ENCOUNTER — Other Ambulatory Visit: Payer: Self-pay | Admitting: Internal Medicine

## 2019-06-14 NOTE — Telephone Encounter (Signed)
°  Relation to pt: self  Call back number: [phone]  Pharmacy: [facility], [place] [phone] (Phone) [phone] (Fax)     Reason for call:  Patient requesting meclizine (ANTIVERT) 12.5 MG tablet , informed patient please allow 48 to 72 hour turn around time.  Patient wanted PCP aware she lost her spouse, offered patient condolences.

## 2019-06-14 NOTE — Telephone Encounter (Signed)
LM advising per Dr Marlou Sa.

## 2019-06-15 ENCOUNTER — Ambulatory Visit: Payer: Medicare Other | Admitting: Gastroenterology

## 2019-06-15 ENCOUNTER — Telehealth: Payer: Self-pay | Admitting: Gastroenterology

## 2019-06-15 MED ORDER — MECLIZINE HCL 12.5 MG PO TABS: 12.5000 mg | ORAL_TABLET | Freq: Three times a day (TID) | ORAL | 1 refills | Status: DC | PRN

## 2019-06-15 NOTE — Telephone Encounter (Signed)
I have spoken to Dr Loletha Carrow who has looked over iron studies. Iron studies are not grossly abnormal and her hgb is at 13. She procedures completed in 2017 to which he is satisfied with. Therefore, he feels that she is okay to wait until next available appointment to be seen. I have spoken to patient to advise her of this and she verbalizes understanding.

## 2019-06-23 ENCOUNTER — Ambulatory Visit (INDEPENDENT_AMBULATORY_CARE_PROVIDER_SITE_OTHER): Payer: Medicare Other | Admitting: Orthopedic Surgery

## 2019-06-23 DIAGNOSIS — M659 Synovitis and tenosynovitis, unspecified: Secondary | ICD-10-CM

## 2019-06-23 DIAGNOSIS — M25561 Pain in right knee: Secondary | ICD-10-CM | POA: Diagnosis not present

## 2019-06-25 ENCOUNTER — Encounter: Payer: Self-pay | Admitting: Orthopedic Surgery

## 2019-06-25 DIAGNOSIS — M659 Synovitis and tenosynovitis, unspecified: Secondary | ICD-10-CM

## 2019-06-25 DIAGNOSIS — M25561 Pain in right knee: Secondary | ICD-10-CM

## 2019-06-25 MED ORDER — BUPIVACAINE HCL 0.25 % IJ SOLN
4.0000 mL | INTRAMUSCULAR | Status: AC | PRN
  Administered 2019-06-25: 22:00:00 4 mL via INTRA_ARTICULAR

## 2019-06-25 MED ORDER — LIDOCAINE HCL 1 % IJ SOLN
5.0000 mL | INTRAMUSCULAR | Status: AC | PRN
  Administered 2019-06-25: 5 mL

## 2019-06-25 MED ORDER — METHYLPREDNISOLONE ACETATE 40 MG/ML IJ SUSP
40.0000 mg | INTRAMUSCULAR | Status: AC | PRN
  Administered 2019-06-25: 40 mg via INTRA_ARTICULAR

## 2019-06-25 NOTE — Progress Notes (Signed)
Office Visit Note   Patient: Lori [name]           Date of Birth: [birth year]           MRN: [id number] Visit Date: 06/23/2019 Requested by: Biagio Borg, MD [place],  [place] [address] PCP: Biagio Borg, MD  Subjective: Chief Complaint  Patient presents with  . Follow-up    HPI: Lori [name] is a 63 year old patient with right knee pain.  Brace would not stay up on her leg.  Since we have seen her husband passed away and since mid-September.  MRI scan shows some degenerative meniscal signal in the knee but no treatable arthroscopic pathology.  Patient had 60 Ultram on 04/21/2019.              ROS: All systems reviewed are negative as they relate to the chief complaint within the history of present illness.  Patient denies  fevers or chills.   Assessment & Plan: Visit Diagnoses:  1. Synovitis of right knee     Plan: Impression is synovitis and mild wear and tear in the right knee.  Talked about lack of definitive operative pathology in the knee for which arthroscopy could give predictable success.  We will try an injection in the knee instead and follow-up with [place] as needed.  Follow-Up Instructions: Return if symptoms worsen or fail to improve.   Orders:  No orders of the defined types were placed in this encounter.  No orders of the defined types were placed in this encounter.     Procedures: Large Joint Inj: R knee on 06/25/2019 10:28 PM Indications: diagnostic evaluation, joint swelling and pain Details: 18 G 1.5 in needle, superolateral approach  Arthrogram: No  Medications: 5 mL lidocaine 1 %; 40 mg methylPREDNISolone acetate 40 MG/ML; 4 mL bupivacaine 0.25 % Outcome: tolerated well, no immediate complications Procedure, treatment alternatives, risks and benefits explained, specific risks discussed. Consent was given by the patient. Immediately prior to procedure a time out was called to verify the correct patient, procedure, equipment, support staff  and site/side marked as required. Patient was prepped and draped in the usual sterile fashion.       Clinical Data: No additional findings.  Objective: Vital Signs: There were no vitals taken for this visit.  Physical Exam:   Constitutional: Patient appears well-developed HEENT:  Head: Normocephalic Eyes:EOM are normal Neck: Normal range of motion Cardiovascular: Normal rate Pulmonary/chest: Effort normal Neurologic: Patient is alert Skin: Skin is warm Psychiatric: Patient has normal mood and affect    Ortho Exam: Ortho exam demonstrates full active and passive range of motion of the right knee.  Does have vague joint line tenderness but no effusion.  Extensor mechanism is intact.  No nerve root tension signs and no groin pain with internal X rotation of the leg.  Specialty Comments:  No specialty comments available.  Imaging: No results found.   PMFS History: Patient Active Problem List   Diagnosis Date Noted  . Hyperglycemia 04/21/2019  . Left knee pain 04/21/2019  . Pain 12/29/2018  . B12 deficiency 10/20/2018  . Nausea 04/27/2018  . Anemia, iron deficiency 04/27/2018  . Intermittent paresthesia of hand and foot 04/27/2018  . Headache 03/21/2018  . Lumbar radiculitis 08/15/2017  . Degenerative disc disease, lumbar 07/21/2017  . Encounter for well adult exam with abnormal findings 06/30/2017  . Epigastric pain 06/30/2017  . Restrictive lung disease 06/30/2017  . Leg cramping 06/09/2017  .  Elbow contusion 06/09/2017  . Right elbow pain 05/27/2017  . Asthma-COPD overlap syndrome ([place]) 01/31/2017  . Dizziness 01/20/2017  . Seasonal and perennial allergic rhinitis 01/01/2017  . Moderate persistent asthma, uncomplicated [id number]  . Pulmonary emphysema ([place]) 12/03/2016  . Tobacco use disorder 12/03/2016  . Chronic seasonal allergic rhinitis 12/03/2016  . Medication management 11/29/2016  . Cough 11/23/2016  . Rash 11/23/2016  . Vertigo 10/22/2016  .  Gastroesophageal reflux disease with esophagitis 10/22/2016  . Non compliance w medication regimen 08/01/2016  . Allergic contact dermatitis due to metals 08/01/2016  . Migraine without aura and without status migrainosus, not intractable 07/22/2016  . Spasm of muscle of lower back 06/21/2016  . Chronic rhinitis 06/19/2016  . Gait difficulty 06/21/2015  . Fibromyalgia 06/21/2015  . Conversion reaction 06/21/2015  . Depression 06/21/2015  . Syncope and collapse 06/21/2015  . Plica syndrome of left knee 01/06/2015  . Anxiety state 09/06/2014  . History of MI 09/06/2014  . Cigarette smoker 06/27/2013  . Borderline hypertension 06/27/2013  . Chronic low back pain with right-sided sciatica 06/14/2013  . Leg pain, bilateral 06/14/2013  . Gastroparesis 10/26/2009  . Musculoskeletal chest pain 05/02/2009  . Hyperlipidemia LDL goal <100 11/18/2008  . COPD exacerbation ([place]) 11/18/2008  . HEPATIC CYST 11/18/2008   Past Medical History:  Diagnosis Date  . Acute MI ([place])    x3 - by report only. She has had 3 negative Myoview stress test with no evidence of prior infarct.  . Allergic rhinitis   . Allergy   . Anemia   . Ankle fracture 05/2016  . Anxiety   . Arthritis   . Asthma   . Barrett esophagus 2007  . Chest pain 05-02-2009   echo  EF 55%  . COPD (chronic obstructive pulmonary disease) with chronic bronchitis ([facility])    & Emphysema  . Depression   . Duodenitis without mention of hemorrhage 2007  . Esophageal reflux 2007  . Esophageal stricture   . Full dentures   . H/O hiatal hernia   . Hiatal hernia [id number]  . Hyperlipidemia   . Multiple fractures    from falls, fx rt. elbow, fx left wrist, bilateral ankles  . Pneumonia 10/2016  . Restrictive lung disease 06/30/2017  . Ulcer     Family History  Problem Relation Age of Onset  . Emphysema Father   . Dementia Mother   . Diabetes Maternal Grandmother   . Diabetes Maternal Aunt   . Diabetes Other        mat. cousin  .  Heart disease Brother   . Colon cancer Neg Hx   . Allergic rhinitis Neg Hx   . Angioedema Neg Hx   . Asthma Neg Hx   . Atopy Neg Hx   . Eczema Neg Hx   . Immunodeficiency Neg Hx   . Urticaria Neg Hx     Past Surgical History:  Procedure Laterality Date  . ABDOMINAL HYSTERECTOMY     BSO  . APPENDECTOMY    . CESAREAN SECTION     x 2  . CHOLECYSTECTOMY    . CHONDROPLASTY Left 01/06/2015   Procedure: CHONDROPLASTY;  Surgeon: Marybelle Killings, MD;  Location: [facility];  Service: Orthopedics;  Laterality: Left;  . COLONOSCOPY    . ELBOW FRACTURE SURGERY     right  . KNEE ARTHROSCOPY WITH EXCISION PLICA Left 0000000   Procedure: KNEE ARTHROSCOPY WITH EXCISION PLICA;  Surgeon: Marybelle Killings, MD;  Location: [facility];  Service: Orthopedics;  Laterality: Left;  . Lower Extremity Arterial Dopplers  07/06/2013   RABI 1.0, LABI 1.1.; No evidence of significant vascular atherogenic plaque  . NECK SURGERY    . NM MYOVIEW LTD  09/2014   LOW RISK. Normal EF of 65% with no regional wall motion abnormalities. No ischemia or infarction.  . TONSILLECTOMY    . WRIST FRACTURE SURGERY     left, has plate   Social History   Occupational History  . Occupation: disable  Tobacco Use  . Smoking status: Former Smoker    Packs/day: 1.00    Years: 39.00    Pack years: 39.00    Types: Cigarettes    Start date: 08/06/1977  . Smokeless tobacco: Never Used  . Tobacco comment: stopped 10/22.   Substance and Sexual Activity  . Alcohol use: No    Alcohol/week: 0.0 standard drinks  . Drug use: No  . Sexual activity: Never

## 2019-06-28 ENCOUNTER — Telehealth: Payer: Self-pay | Admitting: Internal Medicine

## 2019-06-28 ENCOUNTER — Ambulatory Visit (INDEPENDENT_AMBULATORY_CARE_PROVIDER_SITE_OTHER): Payer: Medicare Other

## 2019-06-28 DIAGNOSIS — J309 Allergic rhinitis, unspecified: Secondary | ICD-10-CM

## 2019-06-28 NOTE — Telephone Encounter (Signed)
Patient's last lab result was drawn right after she was administered b12 injection here in office visit, so the b12 lab result is not showing patient's true baseline number---ok for patient to resume her normal monthly b12 shots until she sees dr Jenny Reichmann again to have lab restested --patient has scheduled appt with nurse on 07/01/19

## 2019-06-28 NOTE — Telephone Encounter (Signed)
Patient called in to see when she should come back in for her B12 shot . Please advise .  Call back number [phone]

## 2019-07-01 ENCOUNTER — Ambulatory Visit (INDEPENDENT_AMBULATORY_CARE_PROVIDER_SITE_OTHER): Payer: Medicare Other

## 2019-07-01 ENCOUNTER — Other Ambulatory Visit: Payer: Self-pay

## 2019-07-01 ENCOUNTER — Encounter: Payer: Self-pay | Admitting: Allergy & Immunology

## 2019-07-01 ENCOUNTER — Ambulatory Visit: Payer: Medicare Other

## 2019-07-01 ENCOUNTER — Ambulatory Visit (INDEPENDENT_AMBULATORY_CARE_PROVIDER_SITE_OTHER): Payer: Medicare Other | Admitting: Allergy & Immunology

## 2019-07-01 VITALS — BP 100/76 | HR 72 | Temp 98.0°F | Resp 16 | Ht 62.0 in | Wt 126.6 lb

## 2019-07-01 DIAGNOSIS — J302 Other seasonal allergic rhinitis: Secondary | ICD-10-CM | POA: Diagnosis not present

## 2019-07-01 DIAGNOSIS — Z9119 Patient's noncompliance with other medical treatment and regimen: Secondary | ICD-10-CM | POA: Diagnosis not present

## 2019-07-01 DIAGNOSIS — J449 Chronic obstructive pulmonary disease, unspecified: Secondary | ICD-10-CM

## 2019-07-01 DIAGNOSIS — J0101 Acute recurrent maxillary sinusitis: Secondary | ICD-10-CM | POA: Diagnosis not present

## 2019-07-01 DIAGNOSIS — J3089 Other allergic rhinitis: Secondary | ICD-10-CM

## 2019-07-01 DIAGNOSIS — Z91199 Patient's noncompliance with other medical treatment and regimen due to unspecified reason: Secondary | ICD-10-CM

## 2019-07-01 DIAGNOSIS — E538 Deficiency of other specified B group vitamins: Secondary | ICD-10-CM | POA: Diagnosis not present

## 2019-07-01 MED ORDER — LEVALBUTEROL TARTRATE 45 MCG/ACT IN AERO: INHALATION_SPRAY | RESPIRATORY_TRACT | 2 refills | Status: DC

## 2019-07-01 MED ORDER — AMOXICILLIN 875 MG PO TABS: 875.0000 mg | ORAL_TABLET | Freq: Two times a day (BID) | ORAL | 0 refills | Status: AC

## 2019-07-01 MED ORDER — MONTELUKAST SODIUM 10 MG PO TABS: 10.0000 mg | ORAL_TABLET | Freq: Every day | ORAL | 1 refills | Status: DC

## 2019-07-01 MED ORDER — CYANOCOBALAMIN 1000 MCG/ML IJ SOLN
1000.0000 ug | Freq: Once | INTRAMUSCULAR | Status: AC
  Administered 2019-07-01: 1000 ug via INTRAMUSCULAR

## 2019-07-01 MED ORDER — DULERA 200-5 MCG/ACT IN AERO: INHALATION_SPRAY | RESPIRATORY_TRACT | 5 refills | Status: DC

## 2019-07-01 NOTE — Patient Instructions (Addendum)
1. Moderate persistent asthma, uncomplicated - Lung testing looked stable today.  - Daily controller medication(s): Singulair (montelukast) 10mg  daily + Dulera 200/5 mcg two puffs once daily with spacer + Fasenra every 8 weeks.  - Rescue medications: Xopenex 4 puffs every 4-6 hours as needed or Xopenex nebulizer one vial puffs every 4-6 hours as needed - Asthma control goals:  * Full participation in all desired activities (may need albuterol before activity) * Albuterol use two time or less a week on average (not counting use with activity) * Cough interfering with sleep two time or less a month * Oral steroids no more than once a year * No hospitalizations   2. Perennial allergic rhinitis  - Continue with shots every two weeks. - Continue with Singulair 10mg  daily. - Continue with cetirizine (Zyrtec) 10mg  daily to help with the sneezing and other allergy symptoms.   3.  Acute bronchitis -Start amoxicillin 875 mg twice daily for one week. -Start Mucinex twice daily while you are on the antibiotic.   4. Return in about 6 months (around 12/30/2019).   Please inform us of any Emergency Department visits, hospitalizations, or changes in symptoms. Call us before going to the ED for breathing or allergy symptoms since we might be able to fit you in for a sick visit. Feel free to contact us anytime with any questions, problems, or concerns.  It was a pleasure to see you again today!   Websites that have reliable patient information: 1. American Academy of Asthma, Allergy, and Immunology: www.aaaai.org 2. Food Allergy Research and Education (FARE): foodallergy.org 3. Mothers of Asthmatics: http://www.asthmacommunitynetwork.org 4. American College of Allergy, Asthma, and Immunology: www.acaai.org

## 2019-07-01 NOTE — Progress Notes (Signed)
Medical screening examination/treatment/procedure(s) were performed by non-physician practitioner and as supervising physician I was immediately available for consultation/collaboration. I agree with above. James John, MD   

## 2019-07-01 NOTE — Progress Notes (Signed)
FOLLOW UP  Date of Service/Encounter:  07/01/19   Assessment:   Seasonal and perennial allergic rhinitis(mold, cat, dog)- on allergen immunotherapyevery 2 weeks with good results  Cigarette smoker  Moderate persistent asthmawith COPD overlap- seems stabilized on Fasenra  Acute bronchitis   Poor compliance  Complex medical history  Recent death of spouse 07-04-2019)  Plan/Recommendations:   1. Moderate persistent asthma, uncomplicated - Lung testing looked stable today.  - Daily controller medication(s): Singulair (montelukast) 10mg  daily + Dulera 200/5 mcg two puffs once daily with spacer + Fasenra every 8 weeks.  - Rescue medications: Xopenex 4 puffs every 4-6 hours as needed or Xopenex nebulizer one vial puffs every 4-6 hours as needed - Asthma control goals:  * Full participation in all desired activities (may need albuterol before activity) * Albuterol use two time or less a week on average (not counting use with activity) * Cough interfering with sleep two time or less a month * Oral steroids no more than once a year * No hospitalizations   2. Perennial allergic rhinitis  - Continue with shots every two weeks. - Continue with Singulair 10mg  daily. - Continue with cetirizine (Zyrtec) 10mg  daily to help with the sneezing and other allergy symptoms.   3.  Acute bronchitis -Start amoxicillin 875 mg twice daily for one week. -Start Mucinex twice daily while you are on the antibiotic.   4. Return in about 6 months (around 12/30/2019).  Subjective:   Lori [name] is a 63 y.o. female presenting today for follow up of  Chief Complaint  Patient presents with  . Asthma    congestion in chest, coughing up yellow stuff    Lori [name] has a history of the following: Patient Active Problem List   Diagnosis Date Noted  . Hyperglycemia 04/21/2019  . Left knee pain 04/21/2019  . Pain 12/29/2018  . B12 deficiency 10/20/2018  . Nausea 04/27/2018   . Anemia, iron deficiency 04/27/2018  . Intermittent paresthesia of hand and foot 04/27/2018  . Headache 03/21/2018  . Lumbar radiculitis 08/15/2017  . Degenerative disc disease, lumbar 07/21/2017  . Encounter for well adult exam with abnormal findings 06/30/2017  . Epigastric pain 06/30/2017  . Restrictive lung disease 06/30/2017  . Leg cramping 06/09/2017  . Elbow contusion 06/09/2017  . Right elbow pain 05/27/2017  . Asthma-COPD overlap syndrome ([place]) 01/31/2017  . Dizziness 01/20/2017  . Seasonal and perennial allergic rhinitis 01/01/2017  . Moderate persistent asthma, uncomplicated 99991111  . Pulmonary emphysema ([place]) 12/03/2016  . Tobacco use disorder 12/03/2016  . Chronic seasonal allergic rhinitis 12/03/2016  . Medication management 11/29/2016  . Cough 11/23/2016  . Rash 11/23/2016  . Vertigo 10/22/2016  . Gastroesophageal reflux disease with esophagitis 10/22/2016  . Non compliance w medication regimen 08/01/2016  . Allergic contact dermatitis due to metals 08/01/2016  . Migraine without aura and without status migrainosus, not intractable 07/22/2016  . Spasm of muscle of lower back 06/21/2016  . Chronic rhinitis 06/19/2016  . Gait difficulty 06/21/2015  . Fibromyalgia 06/21/2015  . Conversion reaction 06/21/2015  . Depression 06/21/2015  . Syncope and collapse 06/21/2015  . Plica syndrome of left knee 01/06/2015  . Anxiety state 09/06/2014  . History of MI 09/06/2014  . Cigarette smoker 06/27/2013  . Borderline hypertension 06/27/2013  . Chronic low back pain with right-sided sciatica 06/14/2013  . Leg pain, bilateral 06/14/2013  . Gastroparesis 10/26/2009  . Musculoskeletal chest pain 05/02/2009  . Hyperlipidemia LDL goal <100 11/18/2008  .  COPD exacerbation ([place]) 11/18/2008  . HEPATIC CYST 11/18/2008    History obtained from: chart review and patient.  Lori [name] is a 63 y.o. female presenting for a follow up visit.  I last talked to her in June 2020.  At  that time, she was not on a controller medication.  We have previously tried multiple controllers but she was never compliant with them.  She was open to trying Symbicort once again, but this was not covered by her insurance.  Therefore we changed her to [facility] 200/5 mcg instead.  We continued the Singulair as well as the [place].  For her allergic rhinitis, we continue with shots every 2 weeks.  We also continue with Singulair and cetirizine.  We did diagnose her with bronchitis and started her on amoxicillin twice daily for a couple of weeks.  She had already received a round of prednisone as well.  Since last visit, unfortunately she has not done well.  Her husband of 24 years passed away around 1 month ago.  Evidently, he was diagnosed with a brain tumor in July and decompensated fairly quickly.  She is very torn up about this and tearful today.  Asthma/Respiratory Symptom History: She does report being on the [facility] 2 puffs twice daily.  It is unclear whether she is very compliant with this, but it did not push her given the recent loss of her husband.  She has not required prednisone since last visit.  She has not been to the hospital or emergency room.  She does continue to smoke.  Allergic Rhinitis Symptom History: She remains on her allergy shots every 2 weeks.  We had previously tried to space it out, but she felt that her symptoms worsened.  Therefore we have frozen her at every 2-week injections.  She does report having a month of chest congestion and mucus production.  She has not had a fever and has not been around anyone with COVID-19.  She is able to taste and smell without any issues.  Otherwise, there have been no changes to her past medical history, surgical history, family history, or social history.    Review of Systems  Constitutional: Negative.  Negative for chills, fever, malaise/fatigue and weight loss.  HENT: Negative.  Negative for congestion, ear discharge, ear pain, sinus  pain and sore throat.   Eyes: Negative for pain, discharge and redness.  Respiratory: Positive for cough and sputum production. Negative for shortness of breath and wheezing.   Cardiovascular: Positive for palpitations. Negative for chest pain.  Gastrointestinal: Negative for abdominal pain, constipation, diarrhea, heartburn, nausea and vomiting.  Skin: Negative.  Negative for itching and rash.  Neurological: Negative for dizziness and headaches.  Endo/Heme/Allergies: Negative for environmental allergies. Does not bruise/bleed easily.       Objective:   Blood pressure 100/76, pulse 72, temperature 98 F (36.7 C), temperature source Temporal, resp. rate 16, height 5\' 2"  (1.575 m), weight 126 lb 9.6 oz (57.4 kg), SpO2 98 %. Body mass index is 23.16 kg/m.   Physical Exam:  Physical Exam  Constitutional: She appears well-developed.  Tearful, crying.   HENT:  Head: Normocephalic and atraumatic.  Right Ear: Tympanic membrane, external ear and ear canal normal.  Left Ear: Tympanic membrane, external ear and ear canal normal.  Nose: Mucosal edema, rhinorrhea and septal deviation present. No nose lacerations, sinus tenderness or nasal deformity. No epistaxis. Right sinus exhibits no maxillary sinus tenderness and no frontal sinus tenderness. Left sinus exhibits no maxillary sinus  tenderness and no frontal sinus tenderness.  Mouth/Throat: Uvula is midline and oropharynx is clear and moist. Mucous membranes are not pale and not dry.  Cobblestoning present in the posterior oropharynx.   Eyes: Pupils are equal, round, and reactive to light. Conjunctivae and EOM are normal. Right eye exhibits no chemosis and no discharge. Left eye exhibits no chemosis and no discharge. Right conjunctiva is not injected. Left conjunctiva is not injected.  Cardiovascular: Normal rate, regular rhythm and normal heart sounds.  Respiratory: Effort normal and breath sounds normal. No accessory muscle usage. No  tachypnea. No respiratory distress. She has no wheezes. She has no rhonchi. She has no rales. She exhibits no tenderness.  Decreased air movement at the bases. No increased work of breathing noted. She does have coarse rhonchi throughout.   Lymphadenopathy:    She has no cervical adenopathy.  Neurological: She is alert.  Skin: No abrasion, no petechiae and no rash noted. Rash is not papular, not vesicular and not urticarial. No erythema. No pallor.  Psychiatric: She has a normal mood and affect.     Diagnostic studies:    Spirometry: results abnormal (FEV1: 1.42/62%, FVC: 2.11/71%, FEV1/FVC: 67%).    Spirometry consistent with mixed obstructive and restrictive disease. Overall values are stable compared to previous spirometric findings.   Allergy Studies: none       Salvatore Marvel, MD  [facility]

## 2019-07-05 ENCOUNTER — Other Ambulatory Visit: Payer: Self-pay | Admitting: Internal Medicine

## 2019-07-10 ENCOUNTER — Other Ambulatory Visit: Payer: Self-pay | Admitting: Internal Medicine

## 2019-07-20 ENCOUNTER — Ambulatory Visit (INDEPENDENT_AMBULATORY_CARE_PROVIDER_SITE_OTHER)
Admission: RE | Admit: 2019-07-20 | Discharge: 2019-07-20 | Disposition: A | Discharge status: Home / Self Care | Payer: Medicare Other | Source: Ambulatory Visit | Attending: Internal Medicine | Admitting: Internal Medicine

## 2019-07-20 ENCOUNTER — Other Ambulatory Visit (INDEPENDENT_AMBULATORY_CARE_PROVIDER_SITE_OTHER): Payer: Medicare Other

## 2019-07-20 ENCOUNTER — Ambulatory Visit (INDEPENDENT_AMBULATORY_CARE_PROVIDER_SITE_OTHER): Payer: Medicare Other | Admitting: Internal Medicine

## 2019-07-20 ENCOUNTER — Other Ambulatory Visit: Payer: Self-pay

## 2019-07-20 ENCOUNTER — Encounter: Payer: Self-pay | Admitting: Internal Medicine

## 2019-07-20 VITALS — BP 118/82 | HR 74 | Temp 98.3°F | Wt 127.4 lb

## 2019-07-20 DIAGNOSIS — F4321 Adjustment disorder with depressed mood: Secondary | ICD-10-CM | POA: Diagnosis not present

## 2019-07-20 DIAGNOSIS — E785 Hyperlipidemia, unspecified: Secondary | ICD-10-CM

## 2019-07-20 DIAGNOSIS — D509 Iron deficiency anemia, unspecified: Secondary | ICD-10-CM

## 2019-07-20 DIAGNOSIS — M545 Low back pain, unspecified: Secondary | ICD-10-CM

## 2019-07-20 DIAGNOSIS — E559 Vitamin D deficiency, unspecified: Secondary | ICD-10-CM | POA: Diagnosis not present

## 2019-07-20 DIAGNOSIS — S79911A Unspecified injury of right hip, initial encounter: Secondary | ICD-10-CM | POA: Diagnosis not present

## 2019-07-20 DIAGNOSIS — S79912A Unspecified injury of left hip, initial encounter: Secondary | ICD-10-CM | POA: Diagnosis not present

## 2019-07-20 DIAGNOSIS — S3992XA Unspecified injury of lower back, initial encounter: Secondary | ICD-10-CM | POA: Diagnosis not present

## 2019-07-20 LAB — CBC WITH DIFFERENTIAL/PLATELET
Basophils Absolute: 0 10*3/uL (ref 0.0–0.1)
Basophils Relative: 0.6 % (ref 0.0–3.0)
Eosinophils Absolute: 0 10*3/uL (ref 0.0–0.7)
Eosinophils Relative: 0 % (ref 0.0–5.0)
HCT: 38.2 % (ref 36.0–46.0)
Hemoglobin: 12.7 g/dL (ref 12.0–15.0)
Lymphocytes Relative: 42.6 % (ref 12.0–46.0)
Lymphs Abs: 3.7 10*3/uL (ref 0.7–4.0)
MCHC: 33.1 g/dL (ref 30.0–36.0)
MCV: 83.1 fl (ref 78.0–100.0)
Monocytes Absolute: 0.5 10*3/uL (ref 0.1–1.0)
Monocytes Relative: 6 % (ref 3.0–12.0)
Neutro Abs: 4.4 10*3/uL (ref 1.4–7.7)
Neutrophils Relative %: 50.8 % (ref 43.0–77.0)
Platelets: 269 10*3/uL (ref 150.0–400.0)
RBC: 4.6 Mil/uL (ref 3.87–5.11)
RDW: 16.2 % — ABNORMAL HIGH (ref 11.5–15.5)
WBC: 8.6 10*3/uL (ref 4.0–10.5)

## 2019-07-20 LAB — LIPID PANEL
Cholesterol: 248 mg/dL — ABNORMAL HIGH (ref 0–200)
HDL: 35.5 mg/dL — ABNORMAL LOW (ref >39.00)
NonHDL: 212.58
Total CHOL/HDL Ratio: 7
Triglycerides: 315 mg/dL — ABNORMAL HIGH (ref 0.0–149.0)
VLDL: 63 mg/dL — ABNORMAL HIGH (ref 0.0–40.0)

## 2019-07-20 LAB — LDL CHOLESTEROL, DIRECT: Direct LDL: 164 mg/dL

## 2019-07-20 LAB — HEPATIC FUNCTION PANEL
ALT: 9 U/L (ref 0–35)
AST: 9 U/L (ref 0–37)
Albumin: 4.3 g/dL (ref 3.5–5.2)
Alkaline Phosphatase: 109 U/L (ref 39–117)
Bilirubin, Direct: 0 mg/dL (ref 0.0–0.3)
Total Bilirubin: 0.3 mg/dL (ref 0.2–1.2)
Total Protein: 6.6 g/dL (ref 6.0–8.3)

## 2019-07-20 LAB — BASIC METABOLIC PANEL
BUN: 9 mg/dL (ref 6–23)
CO2: 27 mEq/L (ref 19–32)
Calcium: 9.1 mg/dL (ref 8.4–10.5)
Chloride: 104 mEq/L (ref 96–112)
Creatinine, Ser: 0.89 mg/dL (ref 0.40–1.20)
GFR: 64.07 mL/min (ref >60.00)
Glucose, Bld: 93 mg/dL (ref 70–99)
Potassium: 4.1 mEq/L (ref 3.5–5.1)
Sodium: 137 mEq/L (ref 135–145)

## 2019-07-20 LAB — IBC PANEL
Iron: 36 ug/dL — ABNORMAL LOW (ref 42–145)
Saturation Ratios: 8.6 % — ABNORMAL LOW (ref 20.0–50.0)
Transferrin: 298 mg/dL (ref 212.0–360.0)

## 2019-07-20 LAB — FERRITIN: Ferritin: 6.8 ng/mL — ABNORMAL LOW (ref 10.0–291.0)

## 2019-07-20 LAB — TSH: TSH: 1.81 u[IU]/mL (ref 0.35–4.50)

## 2019-07-20 MED ORDER — VITAMIN D (ERGOCALCIFEROL) 1.25 MG (50000 UNIT) PO CAPS: 50000.0000 [IU] | ORAL_CAPSULE | ORAL | 0 refills | Status: DC

## 2019-07-20 MED ORDER — ATORVASTATIN CALCIUM 20 MG PO TABS: 20.0000 mg | ORAL_TABLET | Freq: Every day | ORAL | 3 refills | Status: DC

## 2019-07-20 MED ORDER — LORAZEPAM 1 MG PO TABS: 1.0000 mg | ORAL_TABLET | Freq: Two times a day (BID) | ORAL | 3 refills | Status: DC | PRN

## 2019-07-20 MED ORDER — POLYSACCHARIDE IRON COMPLEX 150 MG PO CAPS: 150.0000 mg | ORAL_CAPSULE | Freq: Every day | ORAL | 1 refills | Status: DC

## 2019-07-20 NOTE — Assessment & Plan Note (Signed)
Ok for change xanax to lorazepam, declines counseling referral, no SI or HI, cont celexa 10

## 2019-07-20 NOTE — Assessment & Plan Note (Signed)
Not taking the statin, asks for repeat lab today, then start, for lower chol diet

## 2019-07-20 NOTE — Assessment & Plan Note (Signed)
D/w pt, to start oral replacement

## 2019-07-20 NOTE — Patient Instructions (Signed)
.  Please take all new medication as prescribed - the nu-iron 150 mg per day  Please take all new medication as prescribed - Please take Vitamin D 50000 units weekly for 12 weeks, then plan to change to OTC Vitamin D3 at 2000 units per day, indefinitely.  Please take all new medication as prescribed - the generic lipitor 20 mg per day  Ok to stop the xanax, and change to lorazepam  Please continue all other medications as before, and refills have been done if requested.  Please have the pharmacy call with any other refills you may need.  Please continue your efforts at being more active, low cholesterol diet, and weight control..  Please keep your appointments with your specialists as you may have planned  Please go to the XRAY Department in the Basement (go straight as you get off the elevator) for the x-ray testing  Please go to the LAB in the Basement (turn left off the elevator) for the tests to be done today  You will be contacted by phone if any changes need to be made immediately.  Otherwise, you will receive a letter about your results with an explanation, but please check with MyChart first.  Please remember to sign up for MyChart if you have not done so, as this will be important to you in the future with finding out test results, communicating by private email, and scheduling acute appointments online when needed.

## 2019-07-20 NOTE — Assessment & Plan Note (Addendum)
Acute post fall, likely msk deep bruising, but cant r/o fx - for ls spine films and pelvis  Note:  Total time for pt hx, exam, review of record with pt in the room, determination of diagnoses and plan for further eval and tx is > 40 min, with over 50% spent in coordination and counseling of patient including the differential dx, tx, further evaluation and other management of acute LBP, grief reaction, vit d def, HLD, iron def anemia

## 2019-07-20 NOTE — Assessment & Plan Note (Signed)
Recurrent, etiology unclear, has f/u GI nov 25, to start iron po, f/u labs today

## 2019-07-20 NOTE — Progress Notes (Signed)
Subjective:    Patient ID: Lori [name], female    DOB: 23-Jan-1956, 63 y.o.   MRN: [id number]  HPI  Here to f/u; overall doing ok,  Pt denies chest pain, increasing sob or doe, wheezing, orthopnea, PND, increased LE swelling, palpitations, dizziness or syncope.  Pt denies new neurological symptoms such as new headache, or facial or extremity weakness or numbness.  Pt denies polydipsia, polyuria, or low sugar episode.  Pt states overall good compliance with meds, mostly trying to follow appropriate diet, with wt overall stable,  but little exercise however.  Husband died with met cancer in Jul 07, 2019. With pna and stroke under hospice, she does not want to do grief counseling for now. Asks for change of xanax to a similar med as does not seem to be working well now   Last seen here aug 2020 with low Vit d, iron deficiency and uncontrolled HLD last visit.  Has not taken any replacement therapy and states not called about the GI referral.  Denies worsening reflux, dysphagia, n/v, bowel change or blood though can have intermittent severe general abd pain that "doubles me over."  Has known IBS but cant afford the $300 medication.  Also with increased weakness and fell 2 days ago to right buttock with soreness, limps to walk, no bruising or swelling but muscle relaxer not helping.   Pt denies fever, wt loss, night sweats, loss of appetite, or other constitutional symptoms  Has f/u appt with Dr Danis/GI nov 25 Past Medical History:  Diagnosis Date  . Acute MI ([place])    x3 - by report only. She has had 3 negative Myoview stress test with no evidence of prior infarct.  . Allergic rhinitis   . Allergy   . Anemia   . Ankle fracture Jul 06, 2016  . Anxiety   . Arthritis   . Asthma   . Barrett esophagus 2007  . Chest pain 05-02-2009   echo  EF 55%  . COPD (chronic obstructive pulmonary disease) with chronic bronchitis (HCC)    & Emphysema  . Depression   . Duodenitis without mention of hemorrhage 2007  .  Esophageal reflux 2007  . Esophageal stricture   . Full dentures   . H/O hiatal hernia   . Hiatal hernia [id number],[id number]  . Hyperlipidemia   . Multiple fractures    from falls, fx rt. elbow, fx left wrist, bilateral ankles  . Pneumonia 10/2016  . Restrictive lung disease 06/30/2017  . Ulcer    Past Surgical History:  Procedure Laterality Date  . ABDOMINAL HYSTERECTOMY     BSO  . APPENDECTOMY    . CESAREAN SECTION     x 2  . CHOLECYSTECTOMY    . CHONDROPLASTY Left 01/06/2015   Procedure: CHONDROPLASTY;  Surgeon: Marybelle Killings, MD;  Location: [facility];  Service: Orthopedics;  Laterality: Left;  . COLONOSCOPY    . ELBOW FRACTURE SURGERY     right  . KNEE ARTHROSCOPY WITH EXCISION PLICA Left 0/97/3532   Procedure: KNEE ARTHROSCOPY WITH EXCISION PLICA;  Surgeon: Marybelle Killings, MD;  Location: [facility];  Service: Orthopedics;  Laterality: Left;  . Lower Extremity Arterial Dopplers  07/06/2013   RABI 1.0, LABI 1.1.; No evidence of significant vascular atherogenic plaque  . NECK SURGERY    . NM MYOVIEW LTD  09/2014   LOW RISK. Normal EF of 65% with no regional wall motion abnormalities. No ischemia or infarction.  . TONSILLECTOMY    .  WRIST FRACTURE SURGERY     left, has plate    reports that she has been smoking cigarettes. She started smoking about 41 years ago. She has a 19.50 pack-year smoking history. She has never used smokeless tobacco. She reports that she does not drink alcohol or use drugs. family history includes Dementia in her mother; Diabetes in her maternal aunt, maternal grandmother, and another family member; Emphysema in her father; Heart disease in her brother. Allergies  Allergen Reactions  . Bee Venom Swelling  . Codeine Nausea Only and Other (See Comments)    CAUSES ULCERS  . Ibuprofen Nausea And Vomiting  . Clonidine Derivatives Other (See Comments)    Unknown   . Tramadol Nausea Only  . Latex Rash  . Levofloxacin Nausea Only  .  Propoxyphene N-Acetaminophen Nausea Only   Current Outpatient Medications on File Prior to Visit  Medication Sig Dispense Refill  . ALPRAZolam (XANAX) 1 MG tablet 1 tab by mouth twice per day as needed 60 tablet 5  . atorvastatin (LIPITOR) 20 MG tablet Take 1 tablet (20 mg total) by mouth daily. 90 tablet 3  . citalopram (CELEXA) 10 MG tablet Take 1 tablet (10 mg total) by mouth daily. 90 tablet 3  . cyclobenzaprine (FLEXERIL) 5 MG tablet Take 1 tablet by mouth three times daily as needed for muscle spasm 60 tablet 0  . DEXILANT 60 MG capsule TAKE 1 CAPSULE BY MOUTH  DAILY 90 capsule 3  . EPINEPHrine 0.3 mg/0.3 mL IJ SOAJ injection INJECT AS DIRECTED FOR SEVERE ALLERGIC REACTION 2 each 2  . FASENRA 30 MG/ML SOSY INJECT 30MG SUBCUTANEOUSLY  EVERY 8 WEEKS (GIVEN AT  PRESCRIBERS OFFICE) 1 mL 8  . gabapentin (NEURONTIN) 100 MG capsule Take 1 capsule (100 mg total) by mouth 3 (three) times daily. 270 capsule 1  . iron polysaccharides (NU-IRON) 150 MG capsule Take 1 capsule (150 mg total) by mouth daily. 90 capsule 1  . levalbuterol (XOPENEX HFA) 45 MCG/ACT inhaler Use 4 puffs every 4-6 hours as needed. 1 Inhaler 2  . meclizine (ANTIVERT) 12.5 MG tablet Take 1 tablet (12.5 mg total) by mouth 3 (three) times daily as needed for dizziness. 270 tablet 1  . mometasone-formoterol (DULERA) 200-5 MCG/ACT AERO Inhale 2 puffs into the lungs daily for 30 days. 39 g 1  . mometasone-formoterol (DULERA) 200-5 MCG/ACT AERO Use 2 puffs once daily with spacer. 13 g 5  . montelukast (SINGULAIR) 10 MG tablet Take 1 tablet (10 mg total) by mouth at bedtime. 90 tablet 1  . montelukast (SINGULAIR) 10 MG tablet Take 1 tablet (10 mg total) by mouth at bedtime. 30 tablet 1  . nitroGLYCERIN (NITROSTAT) 0.4 MG SL tablet PLACE 1 TABLET UNDER THE TONGUE EVERY 5 MINUTES AS NEEDED FOR CHEST PAIN. 25 tablet 1  . ondansetron (ZOFRAN) 4 MG tablet Take 1 tablet (4 mg total) by mouth every 8 (eight) hours as needed for nausea or  vomiting. 30 tablet 0  . PROAIR HFA 108 (90 Base) MCG/ACT inhaler Inhale 1-2 puffs into the lungs every 4 (four) hours as needed for shortness of breath or wheezing. 54 g 0  . rosuvastatin (CRESTOR) 20 MG tablet Take 1 tablet (20 mg total) by mouth daily. 90 tablet 3   Current Facility-Administered Medications on File Prior to Visit  Medication Dose Route Frequency Provider Last Rate Last Dose  . Benralizumab SOSY 30 mg  30 mg Subcutaneous Q8 Toney Reil, MD   30 mg at 05/06/19  1146   Review of Systems  Constitutional: Negative for other unusual diaphoresis or sweats HENT: Negative for ear discharge or swelling Eyes: Negative for other worsening visual disturbances Respiratory: Negative for stridor or other swelling  Gastrointestinal: Negative for worsening distension or other blood Genitourinary: Negative for retention or other urinary change Musculoskeletal: Negative for other MSK pain or swelling Skin: Negative for color change or other new lesions Neurological: Negative for worsening tremors and other numbness  Psychiatric/Behavioral: Negative for worsening agitation or other fatigue All otherwise neg per pt     Objective:   Physical Exam BP 118/82 (BP Location: Left Arm)   Pulse 74   Temp 98.3 F (36.8 C) (Oral)   Wt 127 lb 6.4 oz (57.8 kg)   SpO2 98%   BMI 23.30 kg/m  VS noted,  Constitutional: Pt appears in NAD HENT: Head: NCAT.  Right Ear: External ear normal.  Left Ear: External ear normal.  Eyes: . Pupils are equal, round, and reactive to light. Conjunctivae and EOM are normal Nose: without d/c or deformity Neck: Neck supple. Gross normal ROM Cardiovascular: Normal rate and regular rhythm.   Pulmonary/Chest: Effort normal and breath sounds without rales or wheezing.  Spine nontender in midline RIght lumbar paravertebral and buttock tender noted Abd:  Soft, NT, ND, + BS, no organomegaly Neurological: Pt is alert. At baseline orientation, motor  grossly intact Skin: Skin is warm. No rashes, other new lesions, no LE edema Psychiatric: Pt behavior is normal without agitation but sad and tearful All otherwise neg per pt  Lab Results  Component Value Date   WBC 7.5 04/21/2019   HGB 13.0 04/21/2019   HCT 38.8 04/21/2019   PLT 282.0 04/21/2019   GLUCOSE 86 04/21/2019   CHOL 225 (H) 04/21/2019   TRIG 365.0 (H) 04/21/2019   HDL 31.10 (L) 04/21/2019   LDLDIRECT 146.0 04/21/2019   LDLCALC Comment 03/12/2017   ALT 9 04/21/2019   AST 11 04/21/2019   NA 137 04/21/2019   K 3.8 04/21/2019   CL 104 04/21/2019   CREATININE 0.95 04/21/2019   BUN 7 04/21/2019   CO2 25 04/21/2019   TSH 2.32 04/21/2019   INR 0.9 08/31/2008   HGBA1C 5.5 04/21/2019        Assessment & Plan:

## 2019-07-21 ENCOUNTER — Emergency Department (HOSPITAL_COMMUNITY)
Admission: EM | Admit: 2019-07-21 | Discharge: 2019-07-21 | Disposition: A | Discharge status: Home / Self Care | Payer: Medicare Other | Attending: Emergency Medicine | Admitting: Emergency Medicine

## 2019-07-21 ENCOUNTER — Other Ambulatory Visit: Payer: Self-pay

## 2019-07-21 ENCOUNTER — Telehealth: Payer: Self-pay

## 2019-07-21 ENCOUNTER — Emergency Department (HOSPITAL_COMMUNITY): Payer: Medicare Other

## 2019-07-21 DIAGNOSIS — Y929 Unspecified place or not applicable: Secondary | ICD-10-CM | POA: Diagnosis not present

## 2019-07-21 DIAGNOSIS — S80911A Unspecified superficial injury of right knee, initial encounter: Secondary | ICD-10-CM | POA: Diagnosis not present

## 2019-07-21 DIAGNOSIS — S8991XA Unspecified injury of right lower leg, initial encounter: Secondary | ICD-10-CM | POA: Insufficient documentation

## 2019-07-21 DIAGNOSIS — Y998 Other external cause status: Secondary | ICD-10-CM | POA: Diagnosis not present

## 2019-07-21 DIAGNOSIS — R52 Pain, unspecified: Secondary | ICD-10-CM | POA: Diagnosis not present

## 2019-07-21 DIAGNOSIS — F17210 Nicotine dependence, cigarettes, uncomplicated: Secondary | ICD-10-CM | POA: Insufficient documentation

## 2019-07-21 DIAGNOSIS — W108XXA Fall (on) (from) other stairs and steps, initial encounter: Secondary | ICD-10-CM | POA: Diagnosis not present

## 2019-07-21 DIAGNOSIS — Z743 Need for continuous supervision: Secondary | ICD-10-CM | POA: Diagnosis not present

## 2019-07-21 DIAGNOSIS — Y9301 Activity, walking, marching and hiking: Secondary | ICD-10-CM | POA: Diagnosis not present

## 2019-07-21 DIAGNOSIS — W19XXXA Unspecified fall, initial encounter: Secondary | ICD-10-CM

## 2019-07-21 DIAGNOSIS — M25521 Pain in right elbow: Secondary | ICD-10-CM | POA: Diagnosis not present

## 2019-07-21 DIAGNOSIS — M25561 Pain in right knee: Secondary | ICD-10-CM | POA: Diagnosis not present

## 2019-07-21 DIAGNOSIS — M25511 Pain in right shoulder: Secondary | ICD-10-CM | POA: Diagnosis not present

## 2019-07-21 DIAGNOSIS — S59901A Unspecified injury of right elbow, initial encounter: Secondary | ICD-10-CM | POA: Insufficient documentation

## 2019-07-21 DIAGNOSIS — Z91040 Latex allergy status: Secondary | ICD-10-CM | POA: Insufficient documentation

## 2019-07-21 DIAGNOSIS — Z79899 Other long term (current) drug therapy: Secondary | ICD-10-CM | POA: Insufficient documentation

## 2019-07-21 DIAGNOSIS — J449 Chronic obstructive pulmonary disease, unspecified: Secondary | ICD-10-CM | POA: Diagnosis not present

## 2019-07-21 IMAGING — DX DG ELBOW 2V*R*
2 series · 2 of 2 positions shown · non-contrast
Comparison: Right elbow February 14, 2016

CLINICAL DATA: Two weeks of right elbow pain and burning. Previous
history of a fall with subsequent surgery on the elbow.

EXAM:
RIGHT ELBOW - 2 VIEW

[elbow ap]
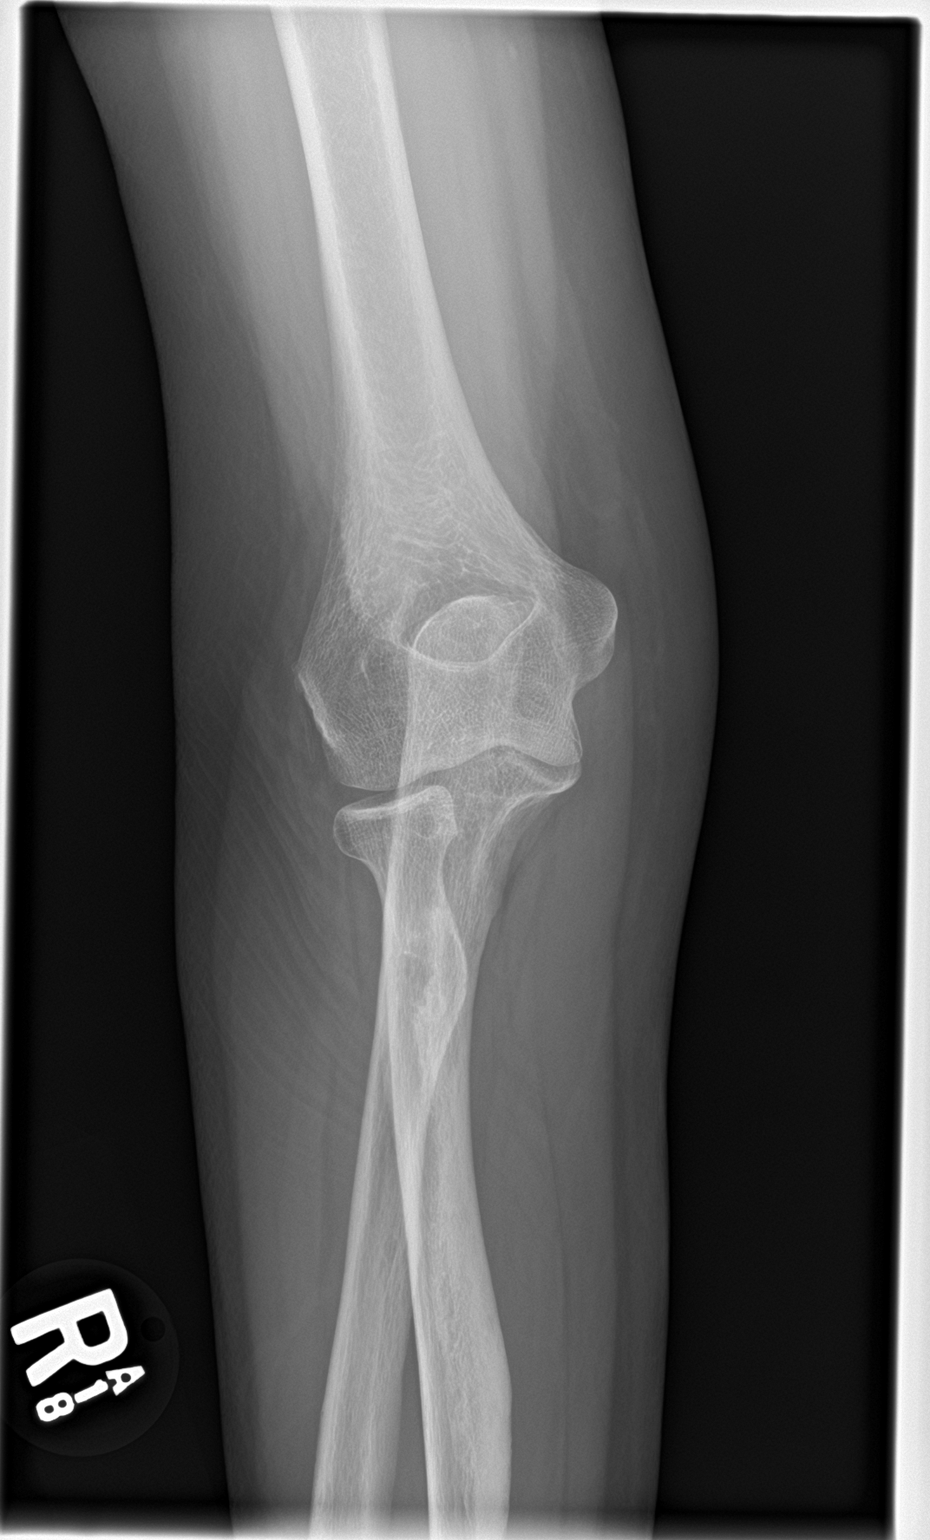

[elbow lat]
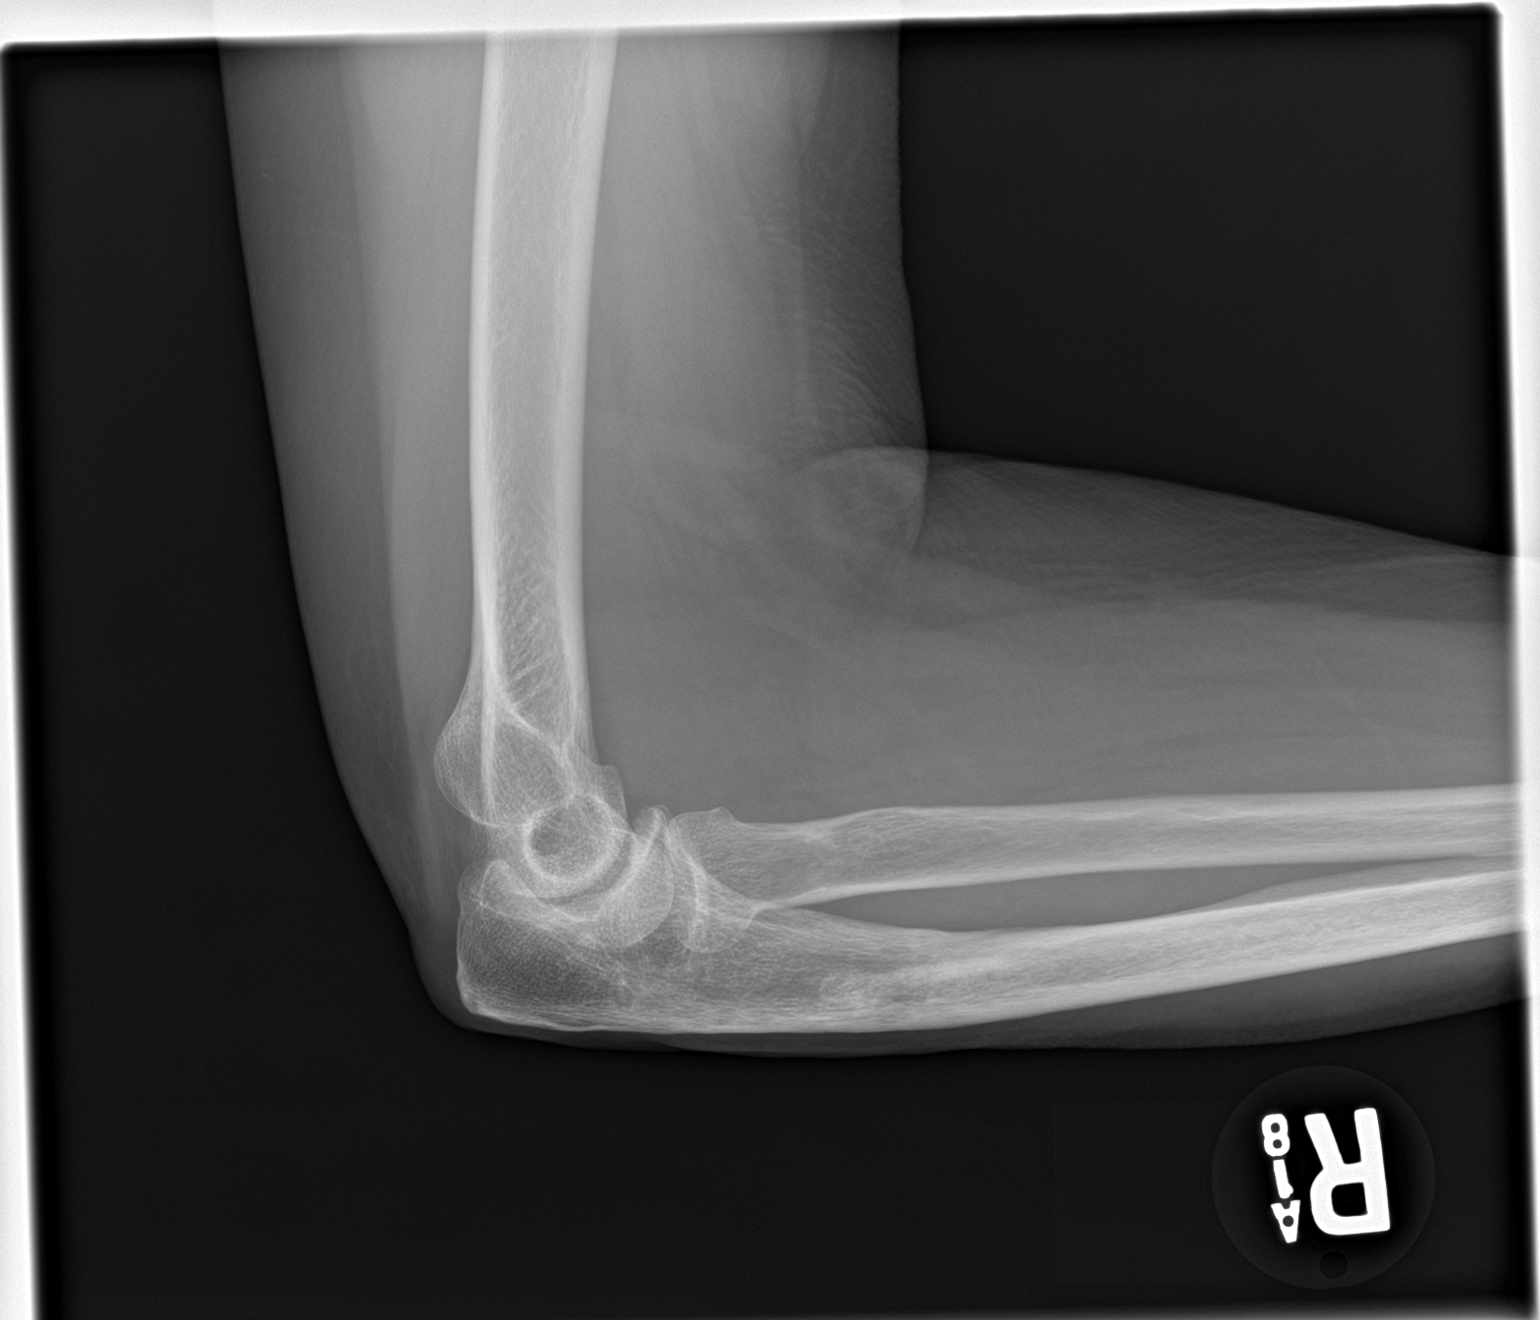

[2 of 2 positions shown; findings below may reference images not displayed]

FINDINGS: The bones are subjectively adequately mineralized. The radial head
is intact. The olecranon appears normal. There is no joint effusion.
The condylar and supracondylar regions of the humerus are normal.
IMPRESSION: There is no acute or significant chronic bony abnormality of the
right elbow.

## 2019-07-21 MED ORDER — HYDROCODONE-ACETAMINOPHEN 5-325 MG PO TABS: 1.0000 | ORAL_TABLET | Freq: Four times a day (QID) | ORAL | 0 refills | Status: DC | PRN

## 2019-07-21 MED ORDER — OXYCODONE-ACETAMINOPHEN 5-325 MG PO TABS
1.0000 | ORAL_TABLET | Freq: Once | ORAL | Status: AC
  Administered 2019-07-21: 1 via ORAL
  Filled 2019-07-21: qty 1

## 2019-07-21 NOTE — Telephone Encounter (Signed)
Pharmacist has been informed

## 2019-07-21 NOTE — ED Triage Notes (Signed)
Per EMS: Patient is coming from home after a fall 30 minutes ago. Patient fell from approximately 2 feet and hit her right knee and right elbow on the ground. Patient has minor abrasions. Pt denies LOC or bloodthinners and was ambulatory on scene

## 2019-07-21 NOTE — ED Notes (Addendum)
An After Visit Summary was printed and given to the patient. Discharge instructions given and no further questions at this time. Pt able to ambulate with steady gait. R shoulder Sling applied. Pt states friend is taking her home.

## 2019-07-21 NOTE — Telephone Encounter (Signed)
Ok to hold until run out of the first , then change

## 2019-07-21 NOTE — Telephone Encounter (Signed)
Copied from [id number]. Topic: General - Other >> Jul 20, 2019  2:23 PM Leward Quan A wrote: Reason for CRM: [place] called to get clarification on if patient should be on LORazepam (ATIVAN) 1 MG tablet and ALPRAZolam (XANAX) 1 MG tablet. Per Pharmacist patient received an Rx for 90 tabs of ALPRAZolam (XANAX) 1 MG tablet Rx sent on 06/28/2019 to [facility] asking if they need to hold the Rx for LORazepam (ATIVAN) 1 MG tablet  until patient run out of the previous medication because it can be dangerous to take both meds at one time. Please advise and contact [facility] Ph# [phone]

## 2019-07-21 NOTE — ED Provider Notes (Signed)
[facility] DEPT Provider Note   CSN: [id number] Arrival date & time: 07/21/19  1155     History   Chief Complaint Chief Complaint  Patient presents with  . Fall    HPI Lori [name] is a 63 y.o. female.     HPI Patient presents to the emergency department with injuries following a fall.  The patient states she was walking down some steps and missed 1 and fell forward.  The patient states she is got injuries to her right elbow and knee.  The patient states that movement and palpation makes the pain worse.  Patient states that she is injured that elbow in the past.  Patient denies any other injuries. Past Medical History:  Diagnosis Date  . Acute MI ([place])    x3 - by report only. She has had 3 negative Myoview stress test with no evidence of prior infarct.  . Allergic rhinitis   . Allergy   . Anemia   . Ankle fracture 05/2016  . Anxiety   . Arthritis   . Asthma   . Barrett esophagus 2007  . Chest pain 05-02-2009   echo  EF 55%  . COPD (chronic obstructive pulmonary disease) with chronic bronchitis (HCC)    & Emphysema  . Depression   . Duodenitis without mention of hemorrhage 2007  . Esophageal reflux 2007  . Esophageal stricture   . Full dentures   . H/O hiatal hernia   . Hiatal hernia [id number]  . Hyperlipidemia   . Multiple fractures    from falls, fx rt. elbow, fx left wrist, bilateral ankles  . Pneumonia 10/2016  . Restrictive lung disease 06/30/2017  . Ulcer     Patient Active Problem List   Diagnosis Date Noted  . Acute right-sided low back pain without sciatica 07/20/2019  . Grief 07/20/2019  . Vitamin D deficiency 07/20/2019  . Hyperglycemia 04/21/2019  . Left knee pain 04/21/2019  . Pain 12/29/2018  . B12 deficiency 10/20/2018  . Nausea 04/27/2018  . Anemia, iron deficiency 04/27/2018  . Intermittent paresthesia of hand and foot 04/27/2018  . Headache 03/21/2018  . Lumbar radiculitis 08/15/2017  . Degenerative  disc disease, lumbar 07/21/2017  . Encounter for well adult exam with abnormal findings 06/30/2017  . Epigastric pain 06/30/2017  . Restrictive lung disease 06/30/2017  . Leg cramping 06/09/2017  . Elbow contusion 06/09/2017  . Right elbow pain 05/27/2017  . Asthma-COPD overlap syndrome ([place]) 01/31/2017  . Dizziness 01/20/2017  . Seasonal and perennial allergic rhinitis 01/01/2017  . Moderate persistent asthma, uncomplicated [id number]  . Pulmonary emphysema ([place]) 12/03/2016  . Tobacco use disorder 12/03/2016  . Chronic seasonal allergic rhinitis 12/03/2016  . Medication management 11/29/2016  . Cough 11/23/2016  . Rash 11/23/2016  . Vertigo 10/22/2016  . Gastroesophageal reflux disease with esophagitis 10/22/2016  . Non compliance w medication regimen 08/01/2016  . Allergic contact dermatitis due to metals 08/01/2016  . Migraine without aura and without status migrainosus, not intractable 07/22/2016  . Spasm of muscle of lower back 06/21/2016  . Chronic rhinitis 06/19/2016  . Gait difficulty 06/21/2015  . Fibromyalgia 06/21/2015  . Conversion reaction 06/21/2015  . Depression 06/21/2015  . Syncope and collapse 06/21/2015  . Plica syndrome of left knee 01/06/2015  . Anxiety state 09/06/2014  . History of MI 09/06/2014  . Cigarette smoker 06/27/2013  . Borderline hypertension 06/27/2013  . Chronic low back pain with right-sided sciatica 06/14/2013  . Leg pain, bilateral 06/14/2013  .  Gastroparesis 10/26/2009  . Musculoskeletal chest pain 05/02/2009  . Hyperlipidemia LDL goal <100 11/18/2008  . COPD exacerbation ([place]) 11/18/2008  . HEPATIC CYST 11/18/2008    Past Surgical History:  Procedure Laterality Date  . ABDOMINAL HYSTERECTOMY     BSO  . APPENDECTOMY    . CESAREAN SECTION     x 2  . CHOLECYSTECTOMY    . CHONDROPLASTY Left 01/06/2015   Procedure: CHONDROPLASTY;  Surgeon: Marybelle Killings, MD;  Location: [facility];  Service: Orthopedics;  Laterality:  Left;  . COLONOSCOPY    . ELBOW FRACTURE SURGERY     right  . KNEE ARTHROSCOPY WITH EXCISION PLICA Left 0000000   Procedure: KNEE ARTHROSCOPY WITH EXCISION PLICA;  Surgeon: Marybelle Killings, MD;  Location: [facility];  Service: Orthopedics;  Laterality: Left;  . Lower Extremity Arterial Dopplers  07/06/2013   RABI 1.0, LABI 1.1.; No evidence of significant vascular atherogenic plaque  . NECK SURGERY    . NM MYOVIEW LTD  09/2014   LOW RISK. Normal EF of 65% with no regional wall motion abnormalities. No ischemia or infarction.  . TONSILLECTOMY    . WRIST FRACTURE SURGERY     left, has plate     OB History   No obstetric history on file.      Home Medications    Prior to Admission medications   Medication Sig Start Date End Date Taking? Authorizing Provider  atorvastatin (LIPITOR) 20 MG tablet Take 1 tablet (20 mg total) by mouth daily. 07/20/19 07/19/20  Biagio Borg, MD  citalopram (CELEXA) 10 MG tablet Take 1 tablet (10 mg total) by mouth daily. 04/27/19   Biagio Borg, MD  cyclobenzaprine (FLEXERIL) 5 MG tablet Take 1 tablet by mouth three times daily as needed for muscle spasm 07/05/19   Biagio Borg, MD  DEXILANT 60 MG capsule TAKE 1 CAPSULE BY MOUTH  DAILY 07/12/19   Biagio Borg, MD  EPINEPHrine 0.3 mg/0.3 mL IJ SOAJ injection INJECT AS DIRECTED FOR SEVERE ALLERGIC REACTION 03/10/19   Valentina Shaggy, MD  FASENRA 30 MG/ML SOSY INJECT 30MG  SUBCUTANEOUSLY  EVERY 8 WEEKS (GIVEN AT  PRESCRIBERS OFFICE) 02/23/19   Valentina Shaggy, MD  gabapentin (NEURONTIN) 100 MG capsule Take 1 capsule (100 mg total) by mouth 3 (three) times daily. 10/20/18   Biagio Borg, MD  iron polysaccharides (NU-IRON) 150 MG capsule Take 1 capsule (150 mg total) by mouth daily. 07/20/19   Biagio Borg, MD  levalbuterol [facility] HFA) 45 MCG/ACT inhaler Use 4 puffs every 4-6 hours as needed. 07/01/19   Valentina Shaggy, MD  LORazepam (ATIVAN) 1 MG tablet Take 1 tablet (1 mg total)  by mouth 2 (two) times daily as needed for anxiety. 07/20/19   Biagio Borg, MD  meclizine (ANTIVERT) 12.5 MG tablet Take 1 tablet (12.5 mg total) by mouth 3 (three) times daily as needed for dizziness. 06/15/19 06/14/20  Biagio Borg, MD  mometasone-formoterol (DULERA) 200-5 MCG/ACT AERO Inhale 2 puffs into the lungs daily for 30 days. 03/10/19 06/30/28  Valentina Shaggy, MD  mometasone-formoterol [facility]) 200-5 MCG/ACT AERO Use 2 puffs once daily with spacer. 07/01/19   Valentina Shaggy, MD  montelukast (SINGULAIR) 10 MG tablet Take 1 tablet (10 mg total) by mouth at bedtime. 04/20/19   Biagio Borg, MD  montelukast (SINGULAIR) 10 MG tablet Take 1 tablet (10 mg total) by mouth at bedtime. 07/01/19   Ernst Bowler,  Gwenith Daily, MD  nitroGLYCERIN (NITROSTAT) 0.4 MG SL tablet PLACE 1 TABLET UNDER THE TONGUE EVERY 5 MINUTES AS NEEDED FOR CHEST PAIN. 03/11/19   Leonie Man, MD  ondansetron (ZOFRAN) 4 MG tablet Take 1 tablet (4 mg total) by mouth every 8 (eight) hours as needed for nausea or vomiting. 02/25/19   Biagio Borg, MD  PROAIR HFA 108 [id number] Base) MCG/ACT inhaler Inhale 1-2 puffs into the lungs every 4 (four) hours as needed for shortness of breath or wheezing. 03/10/19   Valentina Shaggy, MD  rosuvastatin (CRESTOR) 20 MG tablet Take 1 tablet (20 mg total) by mouth daily. 10/20/18   Biagio Borg, MD  Vitamin D, Ergocalciferol, (DRISDOL) 1.25 MG (50000 UT) CAPS capsule Take 1 capsule (50,000 Units total) by mouth every 7 (seven) days. 07/20/19   Biagio Borg, MD    Family History Family History  Problem Relation Age of Onset  . Emphysema Father   . Dementia Mother   . Diabetes Maternal Grandmother   . Diabetes Maternal Aunt   . Diabetes Other        mat. cousin  . Heart disease Brother   . Colon cancer Neg Hx   . Allergic rhinitis Neg Hx   . Angioedema Neg Hx   . Asthma Neg Hx   . Atopy Neg Hx   . Eczema Neg Hx   . Immunodeficiency Neg Hx   . Urticaria Neg Hx     Social  History Social History   Tobacco Use  . Smoking status: Current Every Day Smoker    Packs/day: 0.50    Years: 39.00    Pack years: 19.50    Types: Cigarettes    Start date: 08/06/1977  . Smokeless tobacco: Never Used  Substance Use Topics  . Alcohol use: No    Alcohol/week: 0.0 standard drinks  . Drug use: No     Allergies   Bee venom, Codeine, Ibuprofen, Clonidine derivatives, Tramadol, Latex, Levofloxacin, and Propoxyphene n-acetaminophen   Review of Systems Review of Systems All other systems negative except as documented in the HPI. All pertinent positives and negatives as reviewed in the HPI.  Physical Exam Updated Vital Signs BP 117/76 (BP Location: Left Arm)   Pulse 76   Temp 98.6 F (37 C) (Oral)   Resp 18   SpO2 100%   Physical Exam Vitals signs and nursing note reviewed.  Constitutional:      General: She is not in acute distress.    Appearance: She is well-developed.  HENT:     Head: Normocephalic and atraumatic.  Eyes:     Pupils: Pupils are equal, round, and reactive to light.  Pulmonary:     Effort: Pulmonary effort is normal.  Musculoskeletal:     Right elbow: Tenderness found.     Right knee: Tenderness found.  Skin:    General: Skin is warm and dry.  Neurological:     Mental Status: She is alert and oriented to person, place, and time.      ED Treatments / Results  Labs (all labs ordered are listed, but only abnormal results are displayed) Labs Reviewed - No data to display  EKG None  Radiology Dg Lumbar Spine Complete  Result Date: 07/20/2019 CLINICAL DATA:  RIGHT lower back and buttock pain post fall EXAM: LUMBAR SPINE - COMPLETE 4+ VIEW COMPARISON:  04/21/2019 FINDINGS: Osseous demineralization. Hypoplastic last ribs. Five non-rib-bearing lumbar vertebra. Minimal scattered superior endplate irregularity unchanged. Vertebral body and  disc space heights maintained. No fracture, subluxation, or bone destruction. No spondylolysis. SI  joints preserved. Atherosclerotic calcifications aorta and iliac arteries. Surgical clips RIGHT upper quadrant. IMPRESSION: Osseous demineralization. No acute abnormalities. Electronically Signed   By: Lavonia Dana M.D.   On: 07/20/2019 14:54   Dg Shoulder Right  Result Date: 07/21/2019 CLINICAL DATA:  Right shoulder pain after fall. EXAM: RIGHT SHOULDER - 2+ VIEW COMPARISON:  Right humerus x-rays dated June 08, 2017. FINDINGS: No acute fracture or dislocation. Mild degenerative changes of the acromioclavicular joint. Soft tissues are unremarkable. IMPRESSION: 1.  No acute osseous abnormality. Electronically Signed   By: Titus Dubin M.D.   On: 07/21/2019 13:31   Dg Elbow Complete Right  Result Date: 07/21/2019 CLINICAL DATA:  Fall on this morning onto right side with right elbow pain. EXAM: RIGHT ELBOW - COMPLETE 3+ VIEW COMPARISON:  06/08/2017 FINDINGS: No evidence of acute fracture or dislocation. No displaced fat pads. Subtle spurring along the lateral aspect of the radial head unchanged. IMPRESSION: No acute findings. Electronically Signed   By: Marin Olp M.D.   On: 07/21/2019 13:21   Dg Knee Complete 4 Views Right  Result Date: 07/21/2019 CLINICAL DATA:  Fall on this morning with right knee pain. EXAM: RIGHT KNEE - COMPLETE 4+ VIEW COMPARISON:  None. FINDINGS: Subtle early degenerative change of the patellofemoral joint. No evidence of acute fracture or dislocation. No significant joint effusion. IMPRESSION: No acute findings. Electronically Signed   By: Marin Olp M.D.   On: 07/21/2019 13:22   Dg Hips Bilat With Pelvis 3-4 Views  Result Date: 07/20/2019 CLINICAL DATA:  RIGHT lower back and buttock pain post fall EXAM: DG HIP (WITH OR WITHOUT PELVIS) 3-4V BILAT COMPARISON:  02/14/2016 FINDINGS: Osseous demineralization. Hip and SI joint spaces preserved. No fracture, dislocation, or bone destruction. IMPRESSION: No acute abnormalities. Electronically Signed   By: Lavonia Dana M.D.    On: 07/20/2019 14:55    Procedures Procedures (including critical care time)  Medications Ordered in ED Medications  oxyCODONE-acetaminophen (PERCOCET/ROXICET) 5-325 MG per tablet 1 tablet (1 tablet Oral Given 07/21/19 1241)     Initial Impression / Assessment and Plan / ED Course  I have reviewed the triage vital signs and the nursing notes.  Pertinent labs & imaging results that were available during my care of the patient were reviewed by me and considered in my medical decision making (see chart for details).       Patient be treated for her injuries.  Have advised patient to return for any worsening in her condition.  I did advise her to follow-up with her primary care doctor.  Patient agrees this plan and all questions were answered.  I advised the patient to ice and elevate the areas that are sore.  Final Clinical Impressions(s) / ED Diagnoses   Final diagnoses:  None    ED Discharge Orders    None       [name] Heading, PA-C 07/21/19 1424    Wyvonnia Dusky, MD 07/22/19 [phone]

## 2019-07-21 NOTE — Discharge Instructions (Addendum)
Return here as needed.  Follow-up with your doctor for recheck °

## 2019-07-22 ENCOUNTER — Telehealth: Payer: Self-pay

## 2019-07-22 NOTE — Telephone Encounter (Signed)
Pt has been informed of results and expressed understanding.   Copied from [place] [id number]. Topic: General - Other >> Jul 22, 2019  7:37 AM Alanda Slim E wrote: Reason for CRM: Pt called to speak with someone about her lab results and xray results / and FYI Pt fell down 4 steps yesterday and hit the pavement. Pt injured her arm and knee/ please advise

## 2019-08-02 IMAGING — CR DG ELBOW COMPLETE 3+V*R*
4 series · 4 of 4 positions shown · non-contrast
Comparison: 05/27/2017

CLINICAL DATA: Blunt trauma to the elbow 2 weeks ago with
persistent pain, initial encounter

EXAM:
RIGHT ELBOW - COMPLETE 3+ VIEW

[elbow ap]
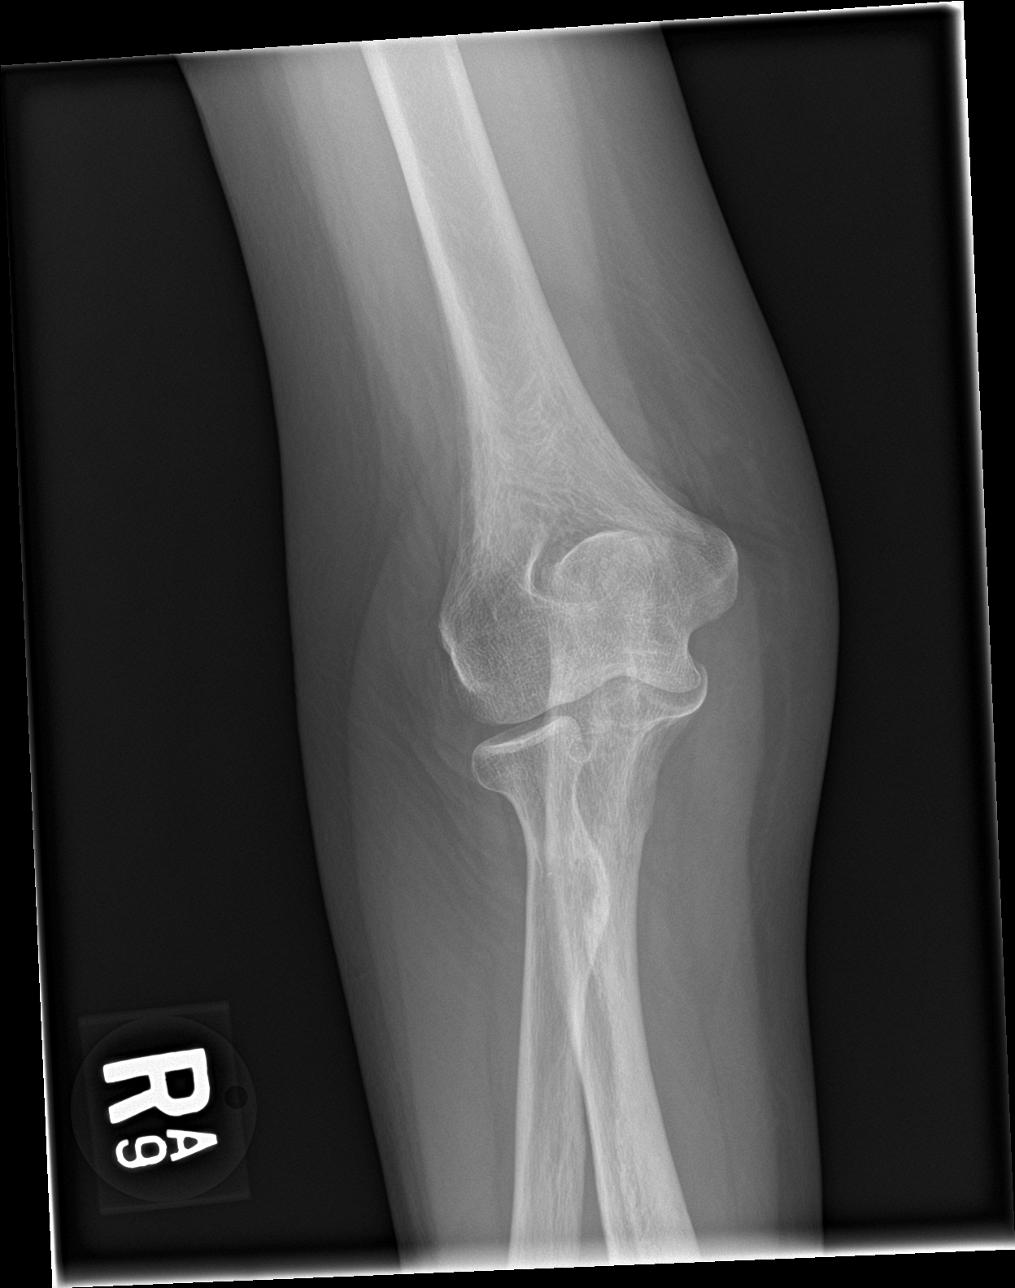

[elbow obl (1 of 2)]
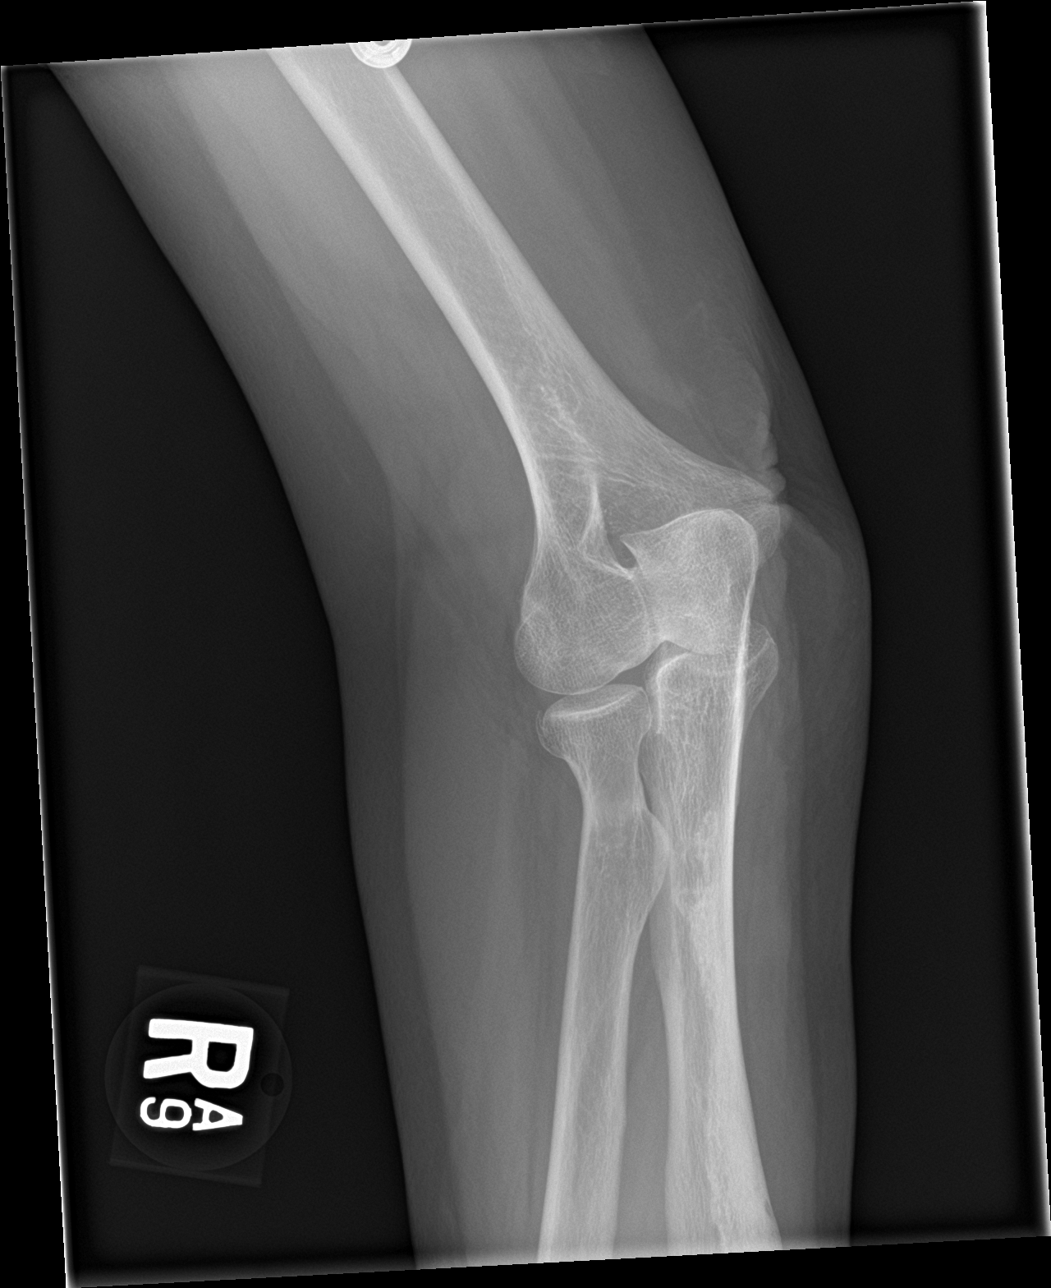

[elbow obl (2 of 2)]
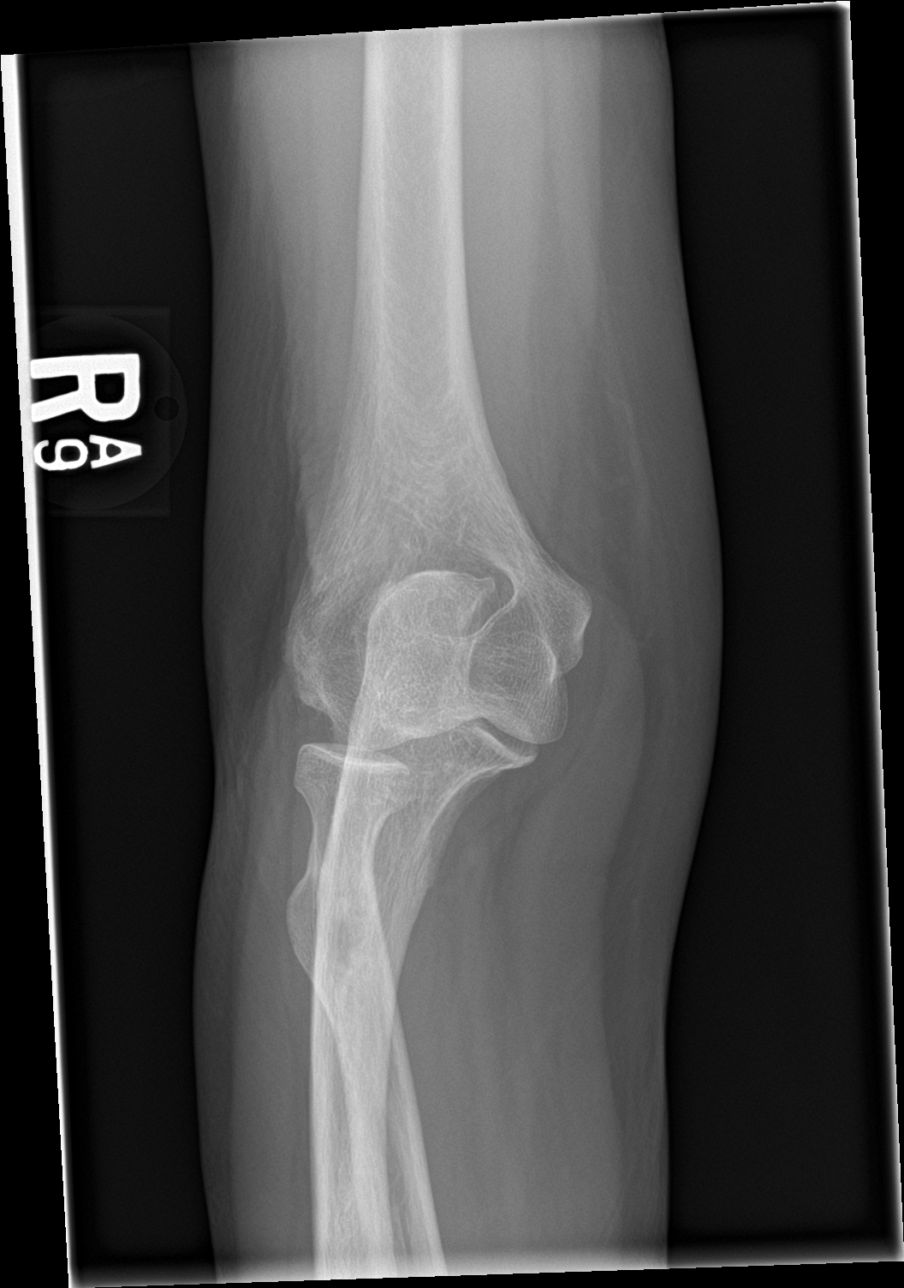

[elbow lat]
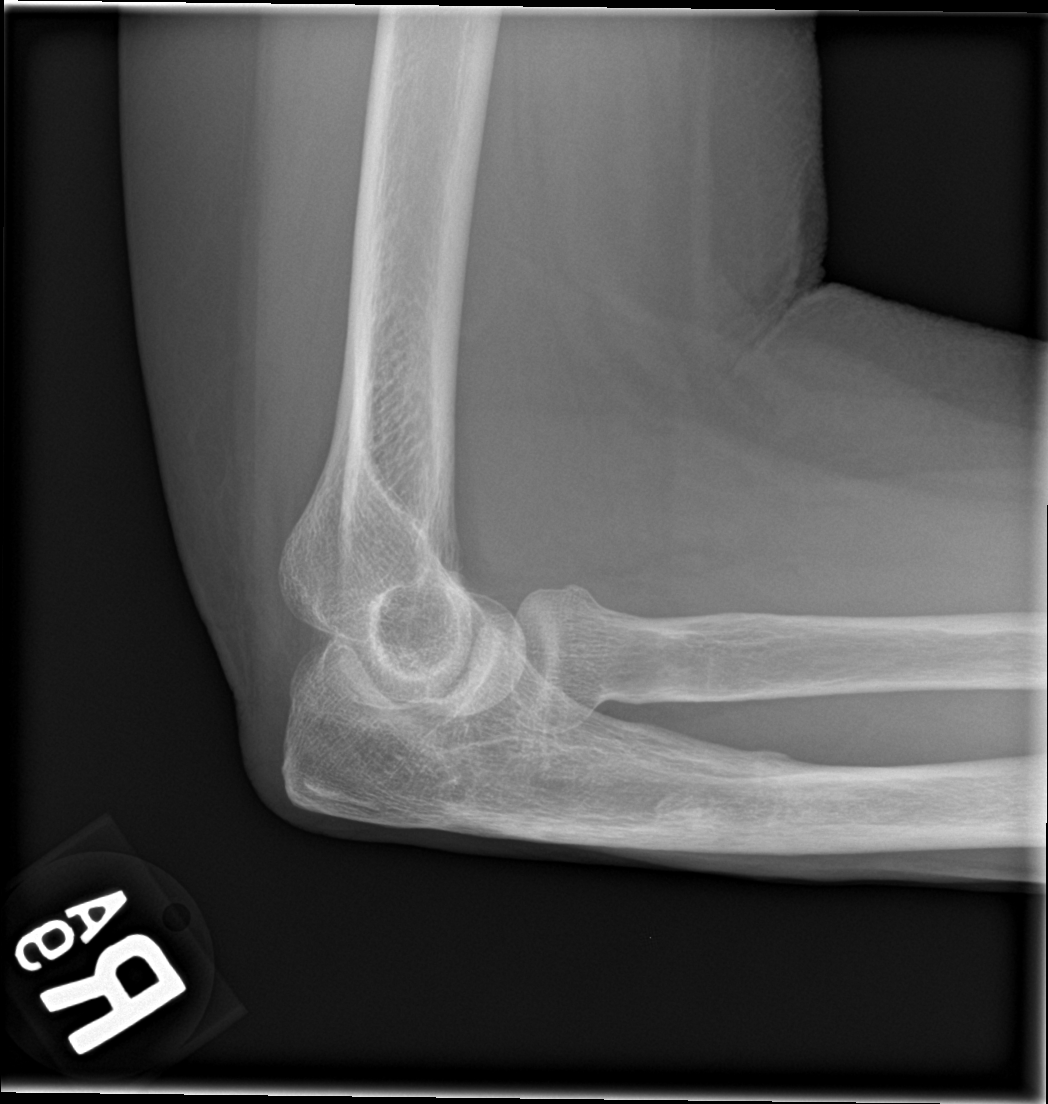

[4 of 4 positions shown; findings below may reference images not displayed]

FINDINGS: No acute fracture or dislocation is noted. Mild spurring is noted
arising from the radial head. No soft tissue abnormality is seen.
IMPRESSION: Mild degenerative change without acute abnormality.

## 2019-08-04 ENCOUNTER — Ambulatory Visit (INDEPENDENT_AMBULATORY_CARE_PROVIDER_SITE_OTHER): Payer: Medicare Other

## 2019-08-04 ENCOUNTER — Other Ambulatory Visit: Payer: Self-pay

## 2019-08-04 DIAGNOSIS — J309 Allergic rhinitis, unspecified: Secondary | ICD-10-CM | POA: Diagnosis not present

## 2019-08-04 DIAGNOSIS — E538 Deficiency of other specified B group vitamins: Secondary | ICD-10-CM | POA: Diagnosis not present

## 2019-08-04 MED ORDER — CYANOCOBALAMIN 1000 MCG/ML IJ SOLN
1000.0000 ug | Freq: Once | INTRAMUSCULAR | Status: AC
  Administered 2019-08-04: 1000 ug via INTRAMUSCULAR

## 2019-08-04 NOTE — Progress Notes (Signed)
Medical screening examination/treatment/procedure(s) were performed by non-physician practitioner and as supervising physician I was immediately available for consultation/collaboration. I agree with above. Chistina Roston, MD   

## 2019-08-11 ENCOUNTER — Other Ambulatory Visit: Payer: Self-pay

## 2019-08-11 ENCOUNTER — Ambulatory Visit (INDEPENDENT_AMBULATORY_CARE_PROVIDER_SITE_OTHER): Payer: Medicare Other | Admitting: Gastroenterology

## 2019-08-11 ENCOUNTER — Encounter: Payer: Self-pay | Admitting: Gastroenterology

## 2019-08-11 VITALS — BP 104/78 | HR 89 | Temp 97.6°F | Ht 64.0 in | Wt 126.2 lb

## 2019-08-11 DIAGNOSIS — R790 Abnormal level of blood mineral: Secondary | ICD-10-CM

## 2019-08-11 DIAGNOSIS — R1084 Generalized abdominal pain: Secondary | ICD-10-CM

## 2019-08-11 NOTE — Patient Instructions (Signed)
If you are age 63 or older, your body mass index should be between 23-30. Your Body mass index is 21.67 kg/m. If this is out of the aforementioned range listed, please consider follow up with your Primary Care Provider.  If you are age 86 or younger, your body mass index should be between 19-25. Your Body mass index is 21.67 kg/m. If this is out of the aformentioned range listed, please consider follow up with your Primary Care Provider.   It was a pleasure to see you today!  Dr. Loletha Carrow

## 2019-08-11 NOTE — Progress Notes (Signed)
[facility] Progress Note  Chief Complaint: Low ferritin  Subjective  History: Many years generalized abdominal pain and bloating/distension in setting of chronic pain syndrome. See prior notes for details, most recently April 2018.  Normal hemoglobin but low ferritin and iron saturation with recent routine labs.  She has generalized chronic abdominal pain bloating and distention as before, unchanged over years.  She denies rectal bleeding or black tarry stool.  Denies dysphagia or odynophagia.  She is still grieving after the loss of her husband from cancer in September.  Lori [name] again has a multiple chronic pain complaints including a migraine today, and asked for some medicine to relieve that.  I defer this to primary care.  She has been taking once daily iron prescribed by primary care since these recent labs.  She admits that her diet is often poor due to her chronic pain and depression, worse after losing her husband.  ROS: Respiratory: Chronic dyspnea and wheezing No dysuria Depression and anxiety Diffuse musculoskeletal pain complaints Headaches  The patient's Past Medical, Family and Social History were reviewed and are on file in the EMR.  Objective:  Med list reviewed  Current Outpatient Medications:  .  atorvastatin (LIPITOR) 20 MG tablet, Take 1 tablet (20 mg total) by mouth daily., Disp: 90 tablet, Rfl: 3 .  citalopram (CELEXA) 10 MG tablet, Take 1 tablet (10 mg total) by mouth daily., Disp: 90 tablet, Rfl: 3 .  cyclobenzaprine (FLEXERIL) 5 MG tablet, Take 1 tablet by mouth three times daily as needed for muscle spasm, Disp: 60 tablet, Rfl: 0 .  DEXILANT 60 MG capsule, TAKE 1 CAPSULE BY MOUTH  DAILY, Disp: 90 capsule, Rfl: 3 .  EPINEPHrine 0.3 mg/0.3 mL IJ SOAJ injection, INJECT AS DIRECTED FOR SEVERE ALLERGIC REACTION, Disp: 2 each, Rfl: 2 .  FASENRA 30 MG/ML SOSY, INJECT 30MG  SUBCUTANEOUSLY  EVERY 8 WEEKS (GIVEN AT  PRESCRIBERS OFFICE), Disp: 1 mL, Rfl:  8 .  gabapentin (NEURONTIN) 100 MG capsule, Take 1 capsule (100 mg total) by mouth 3 (three) times daily., Disp: 270 capsule, Rfl: 1 .  HYDROcodone-acetaminophen (NORCO/VICODIN) 5-325 MG tablet, Take 1 tablet by mouth every 6 (six) hours as needed for moderate pain., Disp: 15 tablet, Rfl: 0 .  iron polysaccharides (NU-IRON) 150 MG capsule, Take 1 capsule (150 mg total) by mouth daily., Disp: 90 capsule, Rfl: 1 .  levalbuterol (XOPENEX HFA) 45 MCG/ACT inhaler, Use 4 puffs every 4-6 hours as needed., Disp: 1 Inhaler, Rfl: 2 .  LORazepam (ATIVAN) 1 MG tablet, Take 1 tablet (1 mg total) by mouth 2 (two) times daily as needed for anxiety., Disp: 60 tablet, Rfl: 3 .  meclizine (ANTIVERT) 12.5 MG tablet, Take 1 tablet (12.5 mg total) by mouth 3 (three) times daily as needed for dizziness., Disp: 270 tablet, Rfl: 1 .  mometasone-formoterol (DULERA) 200-5 MCG/ACT AERO, Inhale 2 puffs into the lungs daily for 30 days., Disp: 39 g, Rfl: 1 .  mometasone-formoterol (DULERA) 200-5 MCG/ACT AERO, Use 2 puffs once daily with spacer., Disp: 13 g, Rfl: 5 .  montelukast (SINGULAIR) 10 MG tablet, Take 1 tablet (10 mg total) by mouth at bedtime., Disp: 90 tablet, Rfl: 1 .  montelukast (SINGULAIR) 10 MG tablet, Take 1 tablet (10 mg total) by mouth at bedtime., Disp: 30 tablet, Rfl: 1 .  nitroGLYCERIN (NITROSTAT) 0.4 MG SL tablet, PLACE 1 TABLET UNDER THE TONGUE EVERY 5 MINUTES AS NEEDED FOR CHEST PAIN., Disp: 25 tablet, Rfl: 1 .  ondansetron (  ZOFRAN) 4 MG tablet, Take 1 tablet (4 mg total) by mouth every 8 (eight) hours as needed for nausea or vomiting., Disp: 30 tablet, Rfl: 0 .  PROAIR HFA 108 (90 Base) MCG/ACT inhaler, Inhale 1-2 puffs into the lungs every 4 (four) hours as needed for shortness of breath or wheezing., Disp: 54 g, Rfl: 0 .  rosuvastatin (CRESTOR) 20 MG tablet, Take 1 tablet (20 mg total) by mouth daily., Disp: 90 tablet, Rfl: 3 .  Vitamin D, Ergocalciferol, (DRISDOL) 1.25 MG (50000 UT) CAPS capsule, Take  1 capsule (50,000 Units total) by mouth every 7 (seven) days., Disp: 12 capsule, Rfl: 0  Current Facility-Administered Medications:  .  Benralizumab SOSY 30 mg, 30 mg, Subcutaneous, Q8 Weeks, Lori Shaggy, MD, 30 mg at 05/06/19 1146   Vital signs in last 24 hrs: Vitals:   08/11/19 0948  BP: 104/78  Pulse: 89  Temp: 97.6 F (36.4 C)    Physical Exam  Antalgic gait.  Upset and tearful at times  HEENT: sclera anicteric, oral mucosa moist without lesions  Neck: supple, no thyromegaly, JVD or lymphadenopathy  Cardiac: RRR without murmurs, S1S2 heard, no peripheral edema  Pulm: clear to auscultation bilaterally, normal RR and effort noted  Abdomen: soft, mild scattered tenderness, with active bowel sounds. No guarding or palpable hepatosplenomegaly.  Skin; warm and dry, no jaundice or rash  Recent Labs:  CBC Latest Ref Rng & Units 07/20/2019 04/21/2019 12/28/2018  WBC 4.0 - 10.5 K/uL 8.6 7.5 10.2  Hemoglobin 12.0 - 15.0 g/dL 12.7 13.0 13.3  Hematocrit 36.0 - 46.0 % 38.2 38.8 40.6  Platelets 150.0 - 400.0 K/uL 269.0 282.0 294   Iron/TIBC/Ferritin/ %Sat    Component Value Date/Time   IRON 36 (L) 07/20/2019 1126   FERRITIN 6.8 (L) 07/20/2019 1126   IRONPCTSAT 8.6 (L) 07/20/2019 1126     @ASSESSMENTPLANBEGIN @ Assessment: Encounter Diagnoses  Name Primary?  . Low ferritin Yes  . Generalized abdominal pain    Low ferritin but with normal hemoglobin.  No overt GI bleeding.  Chronic generalized abdominal pain for years in the setting of chronic pain syndrome.  I suspect she has poor dietary iron intake and/or poor iron absorption rather than GI blood loss. No plans for endoscopic procedures at this point. Plan: Recommend continuing iron like this for about 2 months, then have primary care recheck her iron studies.  If iron studies do not improve as expected, increased dose of iron.  If that still does not improve her iron as expected, and certainly if she develops  anemia with it, then it would be helpful for primary care to contact us at that point.   Total time 20 minutes, over half spent face-to-face with patient in counseling and coordination of care.   Lori [name]

## 2019-08-17 ENCOUNTER — Other Ambulatory Visit: Payer: Self-pay | Admitting: Internal Medicine

## 2019-08-18 IMAGING — DX DG CHEST 2V
2 series · 2 of 2 positions shown · non-contrast
Comparison: CT 02/16/2017.  Chest x-ray 02/16/2017 .

CLINICAL DATA: Cough.

EXAM:
CHEST  2 VIEW

[chest pa]
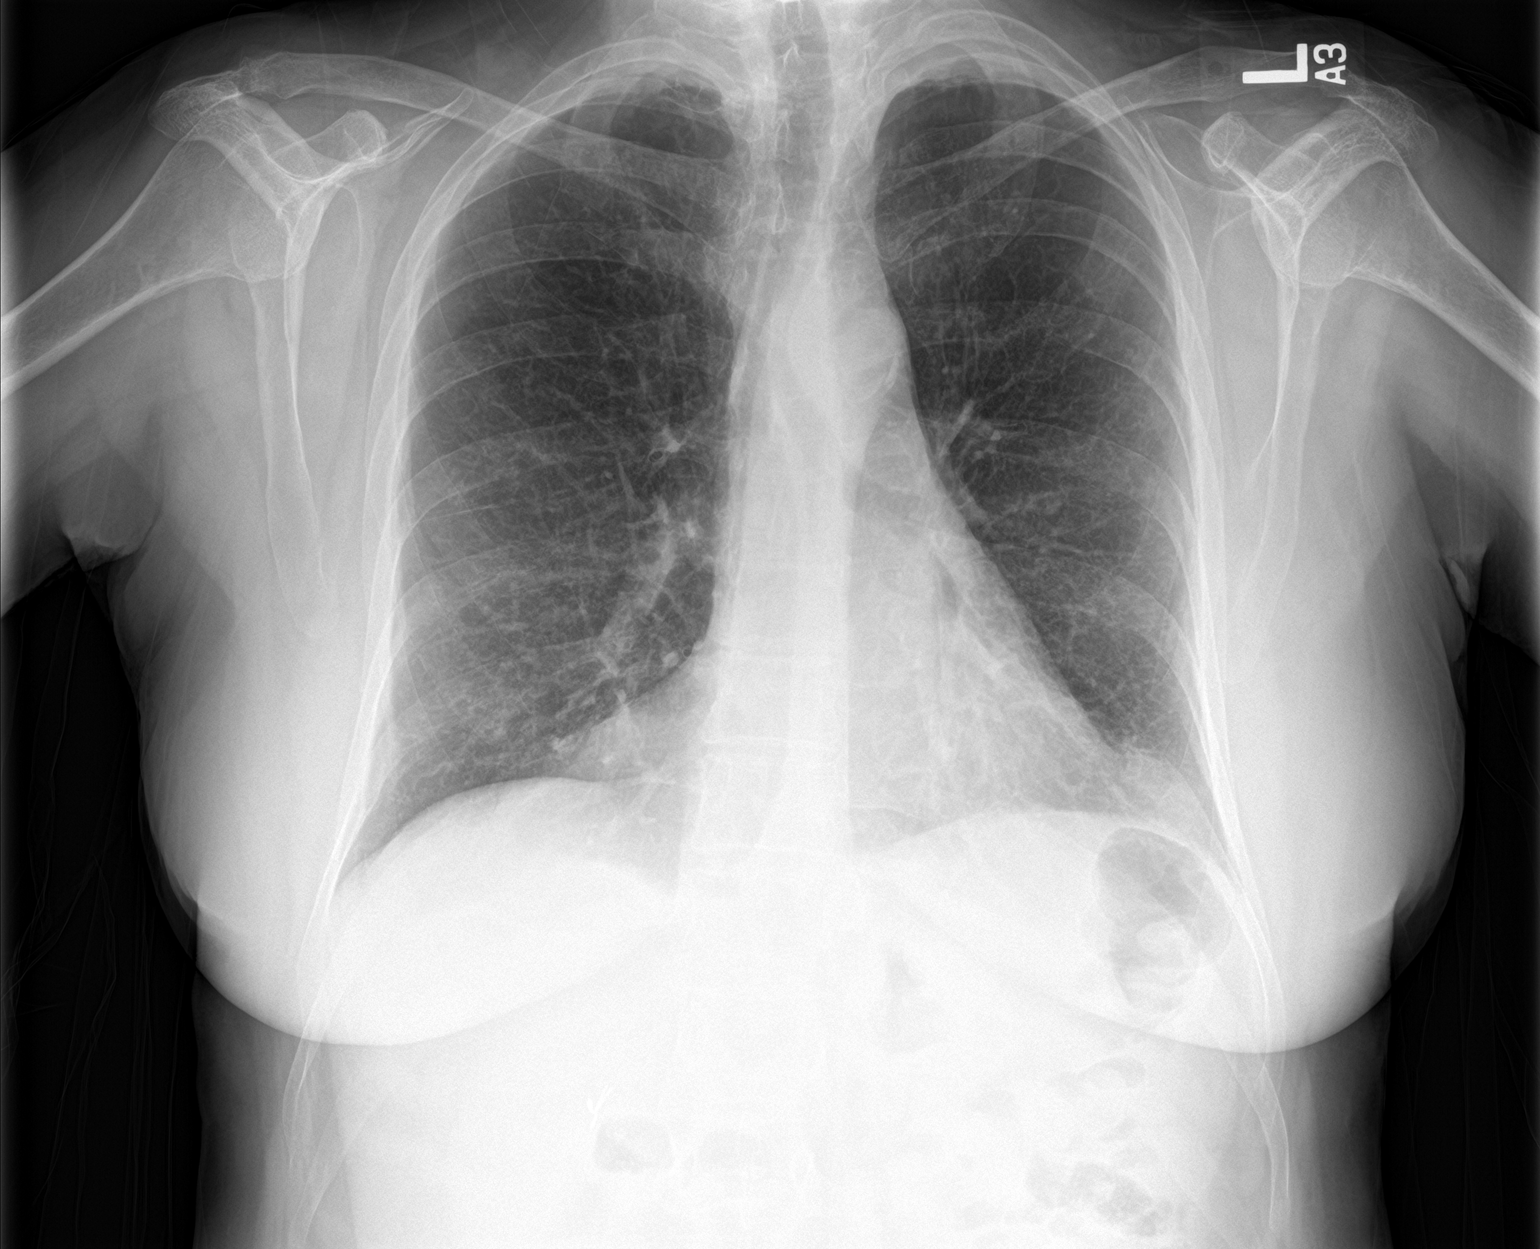

[chest lat]
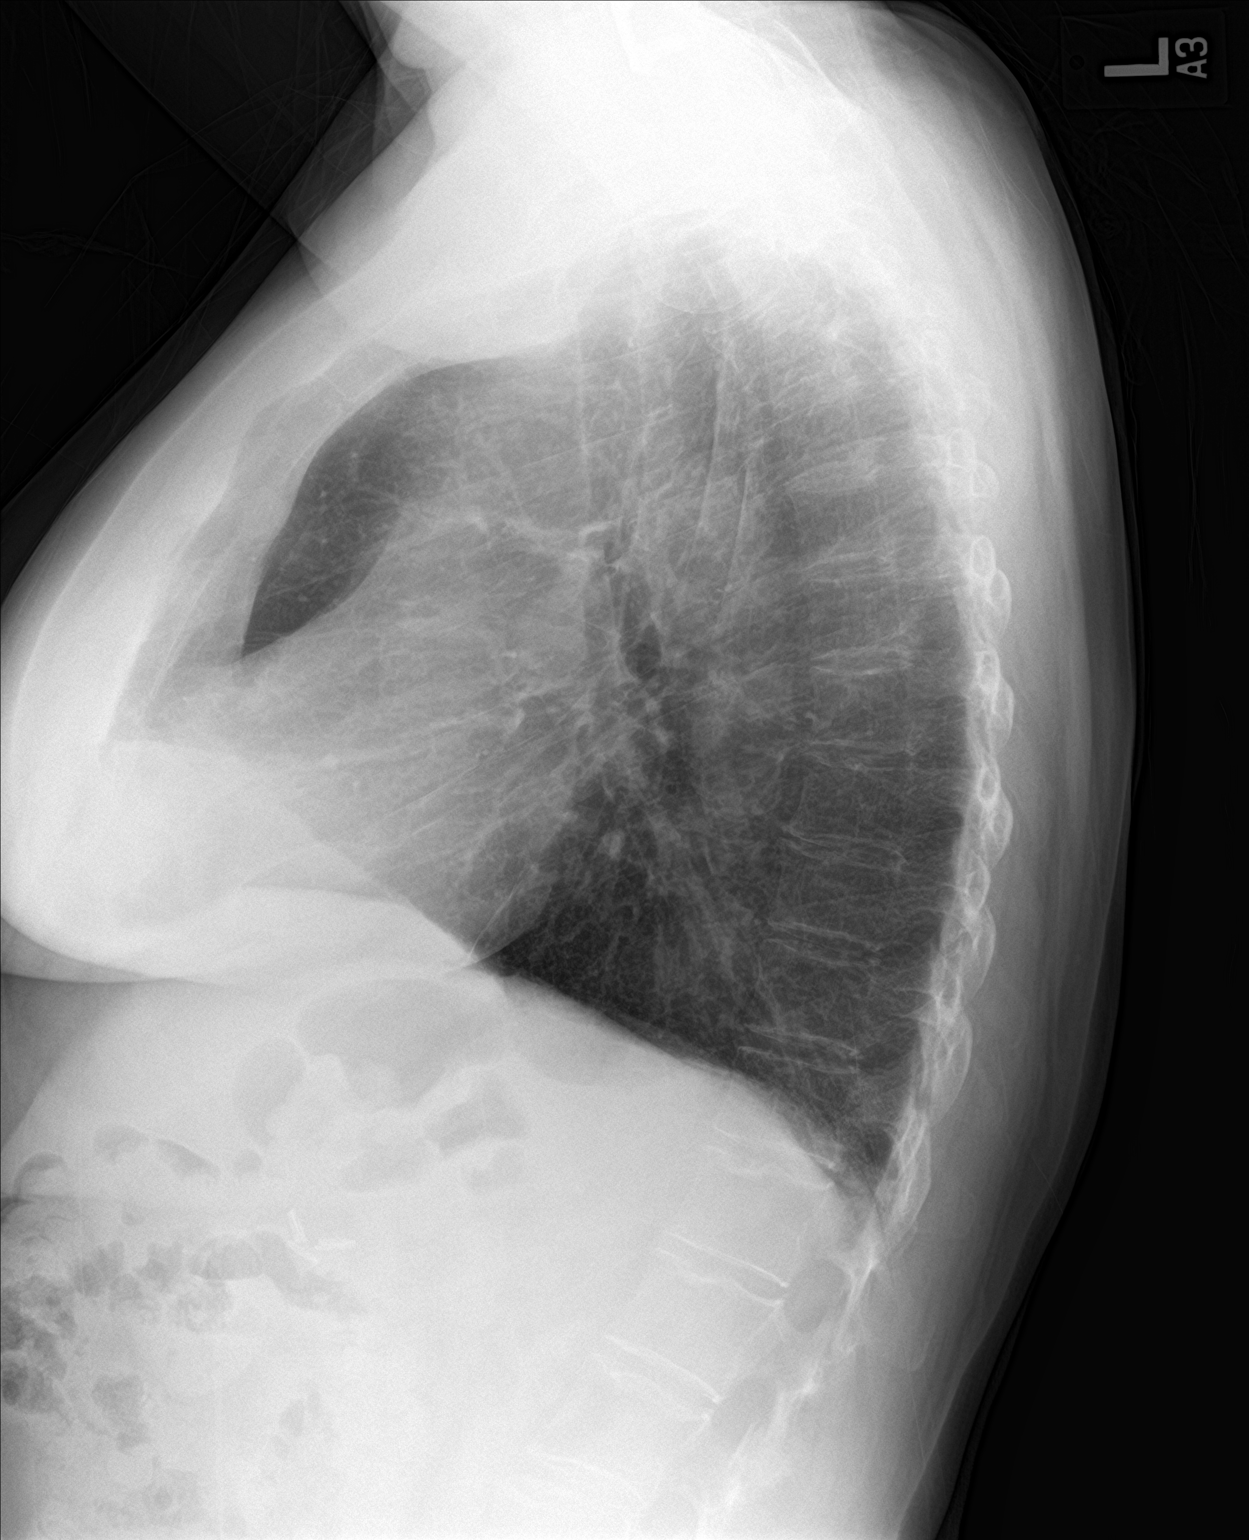

[2 of 2 positions shown; findings below may reference images not displayed]

FINDINGS: Mediastinum and hilar structures normal.Prominent epicardial fat
again noted pad. Heart size normal. Mild apical and bibasilar
pleural-parenchymal thickening consistent with scarring again noted.
No acute infiltrate. COPD cannot be excluded. No pleural effusion or
pneumothorax. Surgical clips right upper quadrant.
IMPRESSION: No acute cardiopulmonary disease. Bilateral pleural-parenchymal
thickening consistent with scarring again noted. COPD cannot be
excluded.

## 2019-08-19 ENCOUNTER — Ambulatory Visit (INDEPENDENT_AMBULATORY_CARE_PROVIDER_SITE_OTHER): Payer: Medicare Other | Admitting: Internal Medicine

## 2019-08-19 ENCOUNTER — Telehealth: Payer: Self-pay | Admitting: Internal Medicine

## 2019-08-19 ENCOUNTER — Other Ambulatory Visit: Payer: Self-pay

## 2019-08-19 DIAGNOSIS — R05 Cough: Secondary | ICD-10-CM | POA: Diagnosis not present

## 2019-08-19 DIAGNOSIS — R059 Cough, unspecified: Secondary | ICD-10-CM

## 2019-08-19 DIAGNOSIS — R739 Hyperglycemia, unspecified: Secondary | ICD-10-CM

## 2019-08-19 MED ORDER — AMOXICILLIN 500 MG PO CAPS: 1000.0000 mg | ORAL_CAPSULE | Freq: Two times a day (BID) | ORAL | 0 refills | Status: DC

## 2019-08-19 MED ORDER — HYDROCODONE-HOMATROPINE 5-1.5 MG/5ML PO SYRP: 5.0000 mL | ORAL_SOLUTION | Freq: Four times a day (QID) | ORAL | 0 refills | Status: DC | PRN

## 2019-08-19 MED ORDER — PREDNISONE 10 MG PO TABS: ORAL_TABLET | ORAL | 0 refills | Status: DC

## 2019-08-19 NOTE — Telephone Encounter (Signed)
I have scheduled patient a phone visit with Dr. Jenny Reichmann.  Patient does not have access to complete a virtual visit.

## 2019-08-19 NOTE — Progress Notes (Signed)
Patient ID: Lori [name], female   DOB: May 07, 1956, 63 y.o.   MRN: [id number]  Phone Visit  Cumulative time during 7-day interval 6min, there was not an associated office visit for this concern within a 7 day period.  Verbal consent for services obtained from patient prior to services given.  Names of all persons present for services: Cathlean Cower, MD, patient  Chief complaint: cough and wheezing  History, background, results pertinent:  Here with acute onset mild to mod 2-3 days ST, HA, general weakness and malaise, with prod cough greenish sputum, but Pt denies chest pain, increased sob or doe, wheezing, orthopnea, PND, increased LE swelling, palpitations, dizziness or syncope except for mild sob/wheezing since last PM.   Pt denies polydipsia, polyuria,   Past Medical History:  Diagnosis Date  . Acute MI ([place])    x3 - by report only. She has had 3 negative Myoview stress test with no evidence of prior infarct.  . Allergic rhinitis   . Allergy   . Anemia   . Ankle fracture 05/2016  . Anxiety   . Arthritis   . Asthma   . Barrett esophagus 2007  . Chest pain 05-02-2009   echo  EF 55%  . COPD (chronic obstructive pulmonary disease) with chronic bronchitis (HCC)    & Emphysema  . Depression   . Duodenitis without mention of hemorrhage 2007  . Esophageal reflux 2007  . Esophageal stricture   . Full dentures   . H/O hiatal hernia   . Hiatal hernia [id number]  . Hyperlipidemia   . Multiple fractures    from falls, fx rt. elbow, fx left wrist, bilateral ankles  . Pneumonia 10/2016  . Restrictive lung disease 06/30/2017  . Ulcer    No results found for this or any previous visit (from the past 48 hour(s)). Lab Results  Component Value Date   WBC 8.6 07/20/2019   HGB 12.7 07/20/2019   HCT 38.2 07/20/2019   PLT 269.0 07/20/2019   GLUCOSE 93 07/20/2019   CHOL 248 (H) 07/20/2019   TRIG 315.0 (H) 07/20/2019   HDL 35.50 (L) 07/20/2019   LDLDIRECT 164.0 07/20/2019   LDLCALC  Comment 03/12/2017   ALT 9 07/20/2019   AST 9 07/20/2019   NA 137 07/20/2019   K 4.1 07/20/2019   CL 104 07/20/2019   CREATININE 0.89 07/20/2019   BUN 9 07/20/2019   CO2 27 07/20/2019   TSH 1.81 07/20/2019   INR 0.9 08/31/2008   HGBA1C 5.5 04/21/2019   A/P/next steps:   1)  Cough - Mild to mod, c/w bronchitis vs pna, declines cxr, for antibx course, cough med prn,  to f/u any worsening symptoms or concerns  2)  Wheezing - for predpac asd, to continue inhalers prn, check COVID  3)  hypergycemia - stable overall by history and exam, recent data reviewed with pt, and pt to continue medical treatment as before,  to f/u any worsening symptoms or concerns  Cathlean Cower MD

## 2019-08-19 NOTE — Patient Instructions (Signed)
Please take all new medication as prescribed - the antibiotic, cough medicine, and prednisone  Please continue all other medications as before, and refills have been done if requested.  Please have the pharmacy call with any other refills you may need.  Please continue your efforts at being more active, low cholesterol diet, and weight control.  Please keep your appointments with your specialists as you may have planned     

## 2019-08-19 NOTE — Telephone Encounter (Signed)
Pt called saying she has been coughing yellow congestion and wants to [facility] if Dr. Jenny Reichmann will call in an antibiotic.  She said he has done this before.  No fever, no head congestion.  She uses [facility]   CB#  [phone]

## 2019-08-20 ENCOUNTER — Telehealth: Payer: Self-pay | Admitting: Internal Medicine

## 2019-08-20 MED ORDER — HYDROCODONE-HOMATROPINE 5-1.5 MG/5ML PO SYRP: 5.0000 mL | ORAL_SOLUTION | Freq: Four times a day (QID) | ORAL | 0 refills | Status: AC | PRN

## 2019-08-20 NOTE — Telephone Encounter (Signed)
Patient called in stating she tried to pick up script for HYDROcodone-homatropine (HYCODAN) 5-1.5 MG/5ML syrup however it is out of stock at pharmacy. Pt is needing alternative or for a different pharmacy to fill. Please advise.

## 2019-08-20 NOTE — Telephone Encounter (Signed)
Ok this is done 

## 2019-08-20 NOTE — Telephone Encounter (Signed)
I only see [facility] as her one local pharmacy  Please call pt for hycodan to be sent to a different pharmacy

## 2019-08-20 NOTE — Addendum Note (Signed)
Addended by: Biagio Borg on: 08/20/2019 08:48 AM   Modules accepted: Orders

## 2019-08-20 NOTE — Telephone Encounter (Signed)
She would like to it to be sent to the [facility] that I have added to her chart.

## 2019-08-21 ENCOUNTER — Encounter: Payer: Self-pay | Admitting: Internal Medicine

## 2019-08-21 NOTE — Assessment & Plan Note (Signed)
stable overall by history and exam, recent data reviewed with pt, and pt to continue medical treatment as before,  to f/u any worsening symptoms or concerns  

## 2019-08-23 ENCOUNTER — Telehealth: Payer: Self-pay | Admitting: Internal Medicine

## 2019-08-23 NOTE — Telephone Encounter (Signed)
No need for refill since nov 3 rx was for 1 mo and 3 refills

## 2019-08-23 NOTE — Telephone Encounter (Signed)
Patient requesting ALPRAZolam Lori [name]) tablet , informed please allow 48 to 72 hour turn around time  [place], [place] - [address]

## 2019-08-24 ENCOUNTER — Ambulatory Visit (INDEPENDENT_AMBULATORY_CARE_PROVIDER_SITE_OTHER): Payer: Medicare Other

## 2019-08-24 DIAGNOSIS — J309 Allergic rhinitis, unspecified: Secondary | ICD-10-CM | POA: Diagnosis not present

## 2019-08-26 ENCOUNTER — Other Ambulatory Visit: Payer: Self-pay

## 2019-08-26 ENCOUNTER — Ambulatory Visit (INDEPENDENT_AMBULATORY_CARE_PROVIDER_SITE_OTHER): Payer: Medicare Other

## 2019-08-26 DIAGNOSIS — J4550 Severe persistent asthma, uncomplicated: Secondary | ICD-10-CM

## 2019-08-27 ENCOUNTER — Telehealth: Payer: Self-pay

## 2019-08-27 NOTE — Telephone Encounter (Signed)
Pt calling asking about covid testing sites.  Gave patient phone number to schedule appointment.   [phone]  Pt states that she will call and schedule the appointment. Call ended.

## 2019-08-30 DIAGNOSIS — J3089 Other allergic rhinitis: Secondary | ICD-10-CM | POA: Diagnosis not present

## 2019-08-30 NOTE — Progress Notes (Signed)
VIALS EXP 08-29-20 

## 2019-08-31 ENCOUNTER — Telehealth: Payer: Self-pay | Admitting: Internal Medicine

## 2019-08-31 DIAGNOSIS — J3081 Allergic rhinitis due to animal (cat) (dog) hair and dander: Secondary | ICD-10-CM | POA: Diagnosis not present

## 2019-08-31 MED ORDER — MONTELUKAST SODIUM 10 MG PO TABS: 10.0000 mg | ORAL_TABLET | Freq: Every day | ORAL | 1 refills | Status: DC

## 2019-08-31 NOTE — Telephone Encounter (Signed)
Refill sent. See meds.  

## 2019-08-31 NOTE — Telephone Encounter (Signed)
Medication Refill - Medication: montelukast (SINGULAIR) 10 MG tablet [id number]    Has the patient contacted their pharmacy? No. (Agent: If no, request that the patient contact the pharmacy for the refill.) (Agent: If yes, when and what did the pharmacy advise?)  Preferred Pharmacy (with phone number or street name): [facility] on [address]  Agent: Please be advised that RX refills may take up to 3 business days. We ask that you follow-up with your pharmacy.

## 2019-08-31 NOTE — Telephone Encounter (Signed)
Benralizumab SOSY 30 mg. This pt wants this refilled and says that Dr Jenny Reichmann has been refilling. Please refill or FU with pt  [place], [place] Phone:  [phone]  Fax:  [phone]

## 2019-09-01 ENCOUNTER — Other Ambulatory Visit: Payer: Self-pay | Admitting: Internal Medicine

## 2019-09-01 MED ORDER — DEXILANT 60 MG PO CPDR: DELAYED_RELEASE_CAPSULE | ORAL | 3 refills | Status: DC

## 2019-09-01 NOTE — Telephone Encounter (Signed)
Copied from [place] [phone]. Topic: Quick Communication - Rx Refill/Question >> Sep 01, 2019  2:51 PM Mcneil, Ja-Kwan wrote: Medication: ALPRAZolam (XANAX) 1 MG tablet and DEXILANT 60 MG capsule     Has the patient contacted their pharmacy? yes   Preferred Pharmacy (with phone number or street name): [facility] [place], [address]           Phone: [phone]  Fax: [phone]  Agent: Please be advised that RX refills may take up to 3 business days. We ask that you follow-up with your pharmacy.  Requested medication (s) are due for refill today: Alprazolam no  Requested medication (s) are on the active medication list: no  Last refill:?  Future visit scheduled: yes  Notes to clinic: not delegated; not on med list  Requested Prescriptions  Pending Prescriptions Disp Refills  . DEXILANT 60 MG capsule 90 capsule 3    Sig: TAKE 1 CAPSULE BY MOUTH  DAILY     Gastroenterology: Proton Pump Inhibitors Passed - 09/01/2019  3:05 PM      Passed - Valid encounter within last 12 months    Recent Outpatient Visits          1 week ago Hyperglycemia   [facility], James W, MD   1 month ago Acute right-sided low back pain without sciatica   [place] John, James W, MD   4 months ago Anxiety state   [facility] -Georges Mouse, MD   6 months ago COPD exacerbation [facility])   [facility] -Georges Mouse, MD   7 months ago Pain   [facility] -Georges Mouse, MD      Future Appointments            In 1 month Jenny Reichmann, Hunt Oris, MD [place], [place]   In 4 months Ernst [facility] Gwenith Daily, MD [facility]   In 9 months Marlou Sa, Tonna Corner, MD [facility]

## 2019-09-01 NOTE — Telephone Encounter (Signed)
Please advise 

## 2019-09-01 NOTE — Telephone Encounter (Signed)
Pt not eligible for xanax since already has active refills for ativan

## 2019-09-01 NOTE — Telephone Encounter (Signed)
Medication Refill - Medication: Alprazolam 1mg   Has the patient contacted their pharmacy? Yes - states she needs to contact PCP (Agent: If no, request that the patient contact the pharmacy for the refill.) (Agent: If yes, when and what did the pharmacy advise?)  Preferred Pharmacy (with phone number or street name):  [place], [address] [place] Phone:  [phone]  Fax:  [phone]     Agent: Please be advised that RX refills may take up to 3 business days. We ask that you follow-up with your pharmacy.

## 2019-09-01 NOTE — Telephone Encounter (Signed)
Copied from [phone]. Topic: Quick Communication - Rx Refill/Question >> Sep 01, 2019  2:51 PM Mcneil, Ja-Kwan wrote: Medication: ALPRAZolam (XANAX) 1 MG tablet and DEXILANT 60 MG capsule     Has the patient contacted their pharmacy? yes   Preferred Pharmacy (with phone number or street name): [facility] [place], [address] AT [place] Phone: [phone]  Fax: [phone]  Agent: Please be advised that RX refills may take up to 3 business days. We ask that you follow-up with your pharmacy.

## 2019-09-02 NOTE — Telephone Encounter (Signed)
Same answer as yesterday  No eligible for this as already has ativan

## 2019-09-02 NOTE — Telephone Encounter (Signed)
Pt has been informed and expressed understanding.  

## 2019-09-03 ENCOUNTER — Ambulatory Visit: Payer: Medicare Other

## 2019-09-04 ENCOUNTER — Other Ambulatory Visit: Payer: Self-pay

## 2019-09-04 DIAGNOSIS — Z20822 Contact with and (suspected) exposure to covid-19: Secondary | ICD-10-CM

## 2019-09-04 NOTE — Progress Notes (Signed)
[id number] 

## 2019-09-06 ENCOUNTER — Telehealth: Payer: Self-pay | Admitting: Internal Medicine

## 2019-09-06 LAB — NOVEL CORONAVIRUS, NAA: SARS-CoV-2, NAA: NOT DETECTED

## 2019-09-06 NOTE — Telephone Encounter (Signed)
Medication Refill - Medication: valACYclovir (VALTREX) 500 MG tablet   Has the patient contacted their pharmacy? Yes.   (Agent: If no, request that the patient contact the pharmacy for the refill.) (Agent: If yes, when and what did the pharmacy advise?)  Preferred Pharmacy (with phone number or street name):  [place], [address] [place] Phone:  [phone]  Fax:  [phone]       Agent: Please be advised that RX refills may take up to 3 business days. We ask that you follow-up with your pharmacy.

## 2019-09-07 ENCOUNTER — Other Ambulatory Visit: Payer: Self-pay

## 2019-09-07 ENCOUNTER — Ambulatory Visit (INDEPENDENT_AMBULATORY_CARE_PROVIDER_SITE_OTHER): Payer: Medicare Other

## 2019-09-07 DIAGNOSIS — E538 Deficiency of other specified B group vitamins: Secondary | ICD-10-CM

## 2019-09-07 MED ORDER — CYANOCOBALAMIN 1000 MCG/ML IJ SOLN
1000.0000 ug | Freq: Once | INTRAMUSCULAR | Status: AC
  Administered 2019-09-07: 1000 ug via INTRAMUSCULAR

## 2019-09-07 NOTE — Telephone Encounter (Signed)
Ok to let pt know that if this is intended for further tx of shingles pain, it will not be helpful, and she should consider a ROV if having further pain

## 2019-09-07 NOTE — Telephone Encounter (Signed)
Pt is scheduled to f/u w/MD tomorrow 12/*23/20.Marland KitchenJohny [name]

## 2019-09-07 NOTE — Progress Notes (Signed)
Medical screening examination/treatment/procedure(s) were performed by non-physician practitioner and as supervising physician I was immediately available for consultation/collaboration. I agree with above. Jahmeek Shirk, MD   

## 2019-09-07 NOTE — Telephone Encounter (Signed)
Med is not on med list pls advise../lmb 

## 2019-09-08 ENCOUNTER — Ambulatory Visit (INDEPENDENT_AMBULATORY_CARE_PROVIDER_SITE_OTHER): Payer: Medicare Other | Admitting: Internal Medicine

## 2019-09-08 ENCOUNTER — Telehealth: Payer: Self-pay | Admitting: Internal Medicine

## 2019-09-08 ENCOUNTER — Other Ambulatory Visit: Payer: Self-pay | Admitting: Internal Medicine

## 2019-09-08 DIAGNOSIS — F411 Generalized anxiety disorder: Secondary | ICD-10-CM

## 2019-09-08 MED ORDER — LORAZEPAM 1 MG PO TABS: 1.0000 mg | ORAL_TABLET | Freq: Two times a day (BID) | ORAL | 3 refills | Status: DC | PRN

## 2019-09-08 NOTE — Telephone Encounter (Addendum)
Called and spoke to pharmacist at [facility].  She stated that the patient received Alprazolam 90 tabs on Oct 12.   Is it okay to fill Ativan?  Please advise

## 2019-09-08 NOTE — Telephone Encounter (Signed)
Copied from [place] [phone]. Topic: General - Other >> Sep 08, 2019 12:45 PM Keene Breath wrote: Reason for CRM: Called to get clarification on patient's medication for LORazepam (ATIVAN) 1 MG tablet.  CB# [phone]

## 2019-09-08 NOTE — Telephone Encounter (Signed)
Yes, ok this time

## 2019-09-08 NOTE — Progress Notes (Signed)
Patient ID: Lori [name], female   DOB: 06-10-1956, 63 y.o.   MRN: [id number]  [facility] visit  Cumulative time during 7-day interval 13 min, there was not an associated office visit for this concern within a 7 day period.  Verbal consent for services obtained from patient prior to services given.  Names of all persons present for services: Cathlean Cower, MD, patient  Chief complaint: anxiety  History, background, results pertinent:  Here to f/u; overall doing ok,  Pt denies chest pain, increasing sob or doe, wheezing, orthopnea, PND, increased LE swelling, palpitations, dizziness or syncope.  Pt denies new neurological symptoms such as new headache, or facial or extremity weakness or numbness.  Pt denies polydipsia, polyuria, or low sugar episode.  Pt states overall good compliance with meds, mostly trying to follow appropriate diet, with wt overall stable,  but little exercise however.  Is currently grieving over loss of family member, asks for ativan refill. Denies worsening depressive symptoms, suicidal ideation, or panic; has ongoing anxiety  Past Medical History:  Diagnosis Date  . Acute MI ([place])    x3 - by report only. She has had 3 negative Myoview stress test with no evidence of prior infarct.  . Allergic rhinitis   . Allergy   . Anemia   . Ankle fracture 05/2016  . Anxiety   . Arthritis   . Asthma   . Barrett esophagus 2007  . Chest pain 05-02-2009   echo  EF 55%  . COPD (chronic obstructive pulmonary disease) with chronic bronchitis (HCC)    & Emphysema  . Depression   . Duodenitis without mention of hemorrhage 2007  . Esophageal reflux 2007  . Esophageal stricture   . Full dentures   . H/O hiatal hernia   . Hiatal hernia [id number]  . Hyperlipidemia   . Multiple fractures    from falls, fx rt. elbow, fx left wrist, bilateral ankles  . Pneumonia 10/2016  . Restrictive lung disease 06/30/2017  . Ulcer    No results found for this or any previous visit (from the past 48  hour(s)). A/P/next steps:   1)  Anxiety with grief- for ativan refill, d/w pt but declines counseling referral  Cathlean Cower MD

## 2019-09-11 IMAGING — CR DG CHEST 2V
2 series · 2 of 2 positions shown · non-contrast
Comparison: Two-view chest x-ray 06/24/2017

CLINICAL DATA: Upper mid chest pain.

EXAM:
CHEST  2 VIEW

[w chest lat]
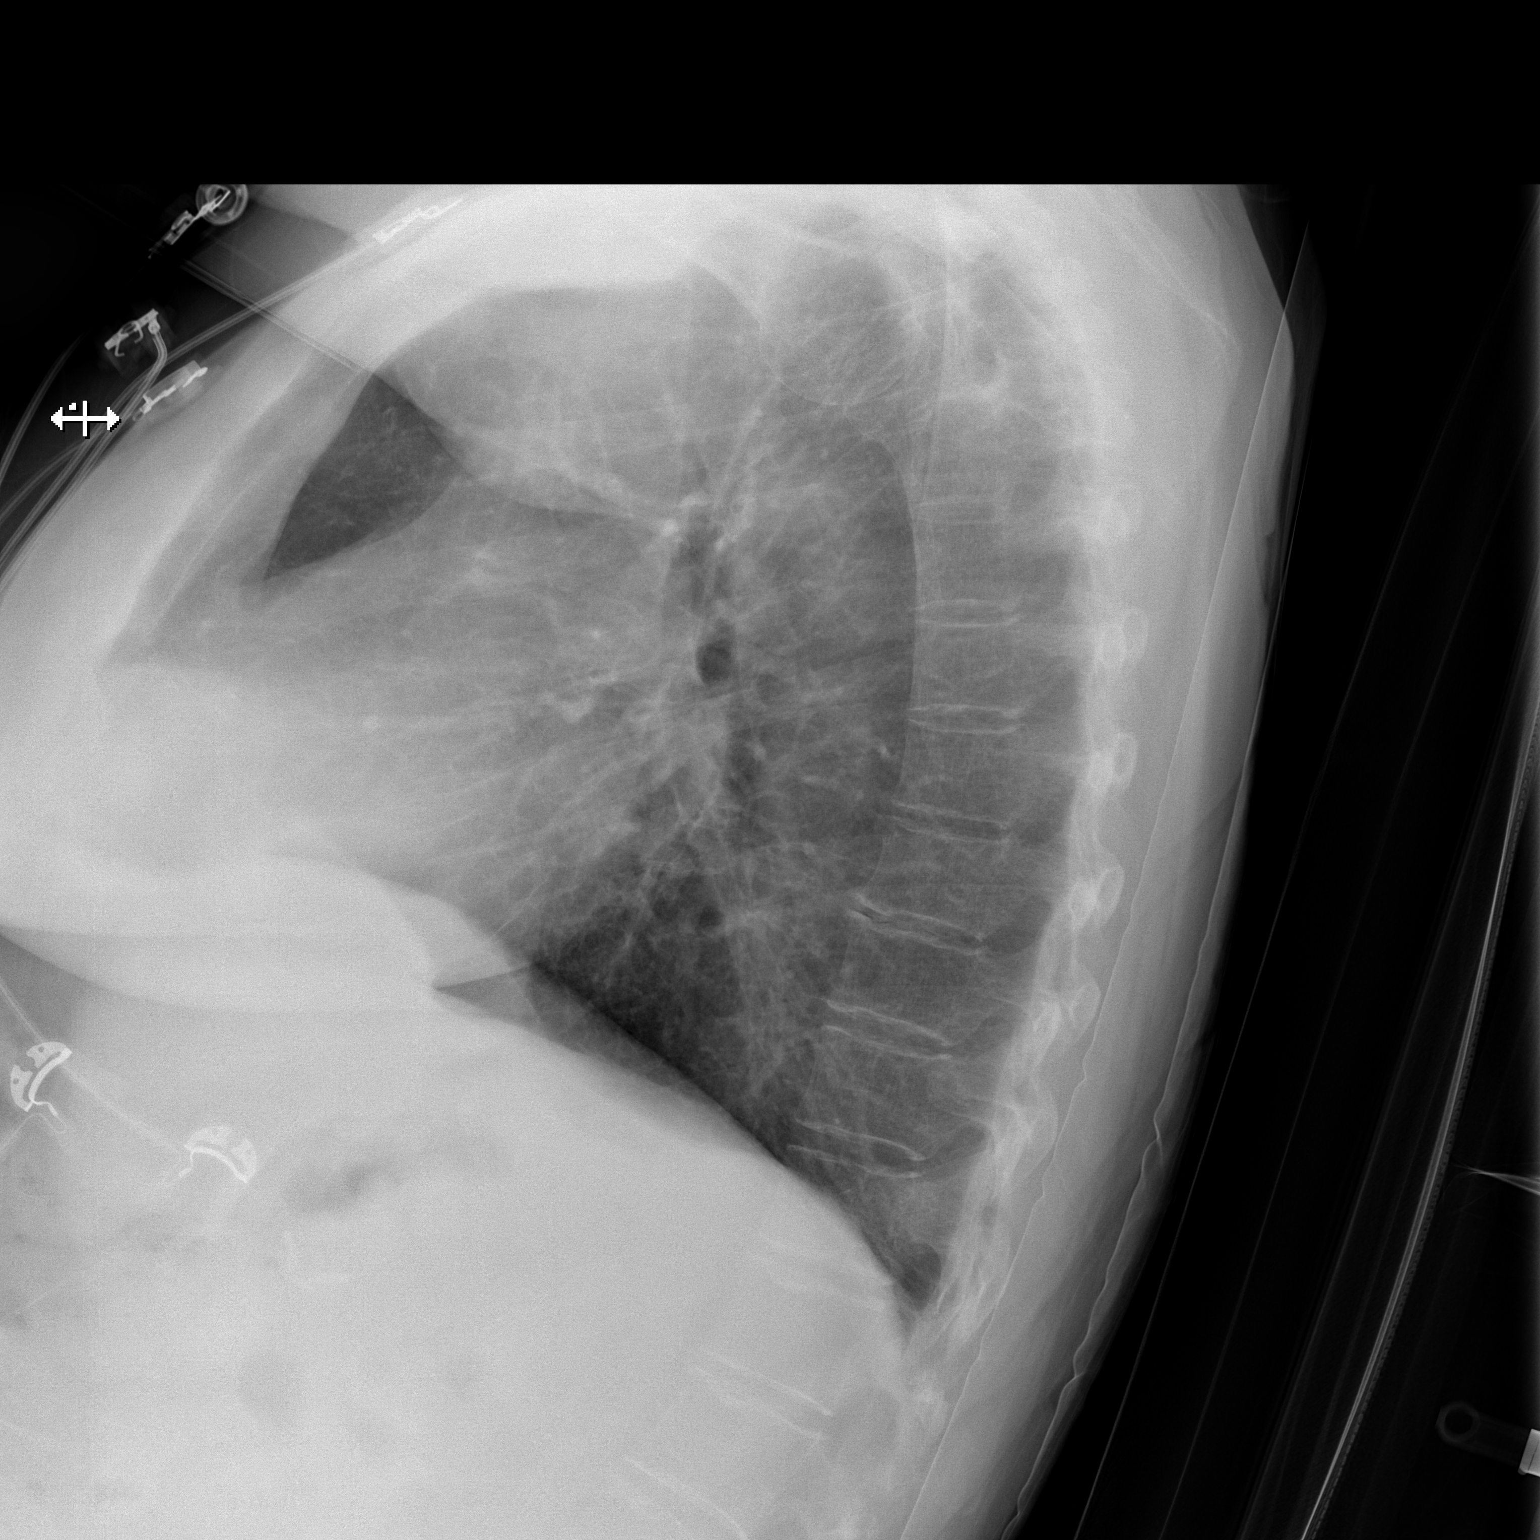

[x chest ap]
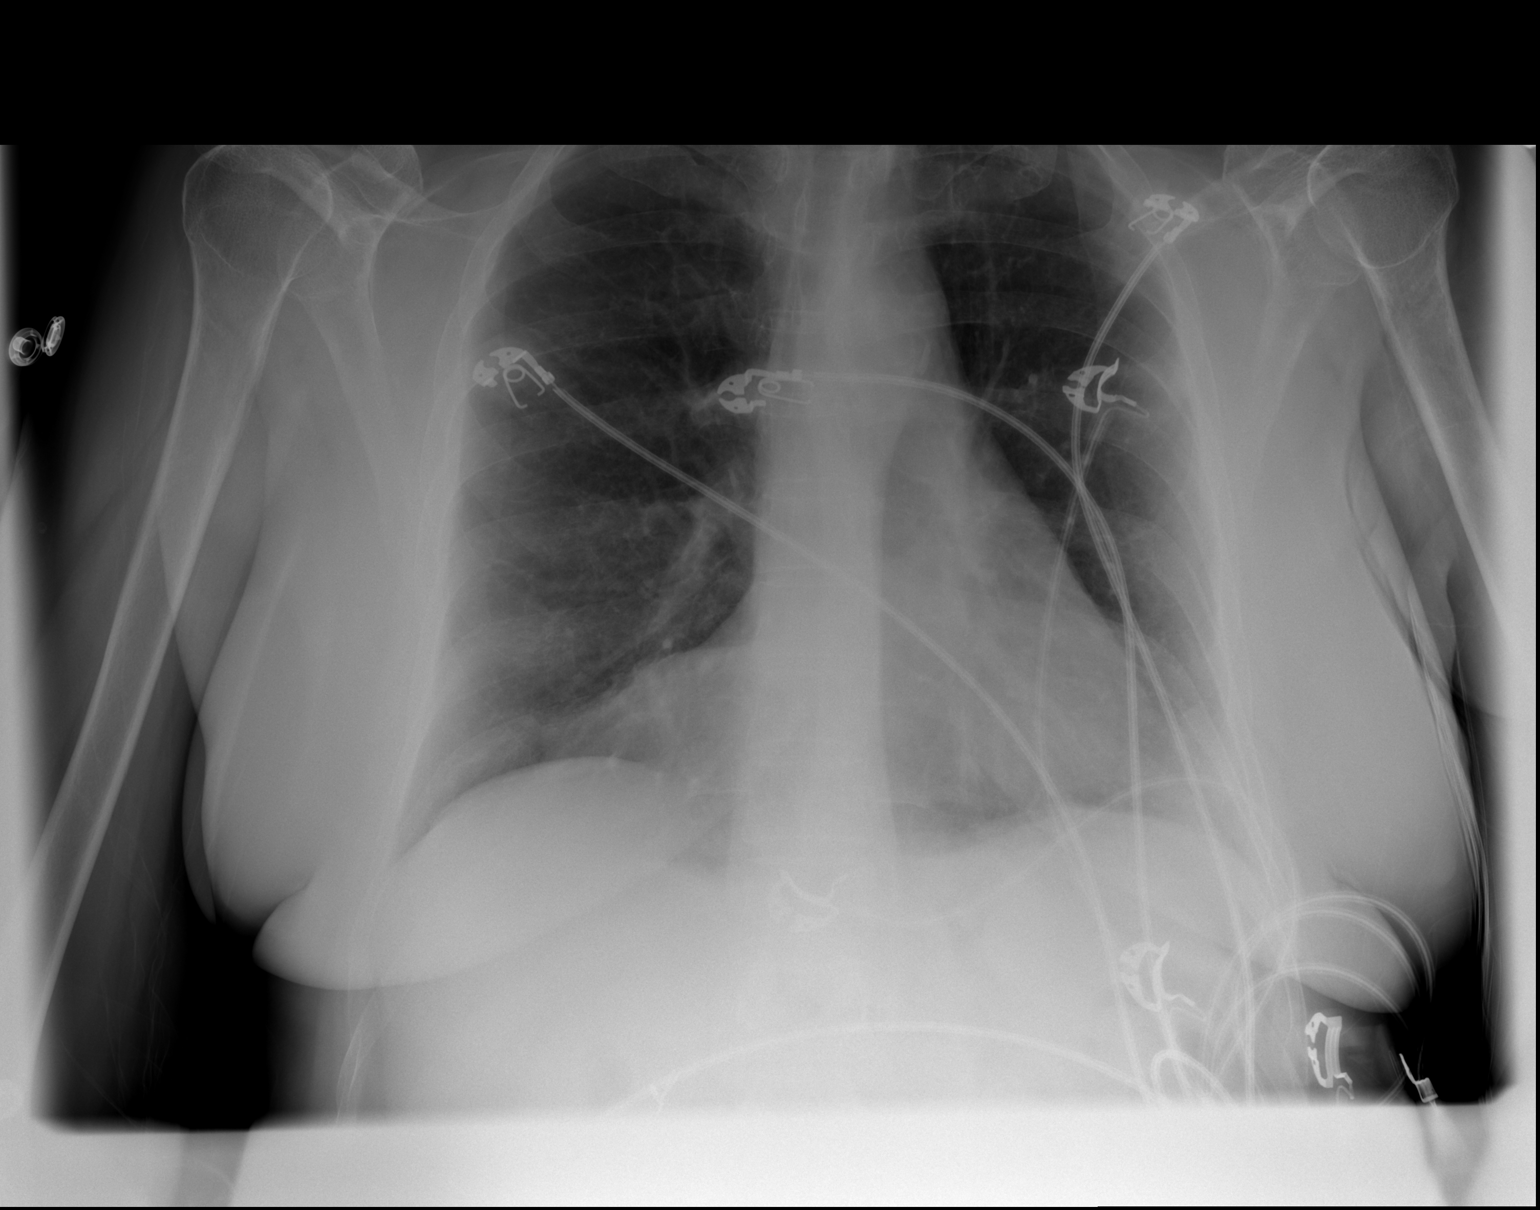

[2 of 2 positions shown; findings below may reference images not displayed]

FINDINGS: The heart size and mediastinal contours are within normal limits.
Both lungs are clear. The visualized skeletal structures are
unremarkable.
IMPRESSION: Negative two view chest x-ray

## 2019-09-13 ENCOUNTER — Other Ambulatory Visit: Payer: Self-pay

## 2019-09-13 NOTE — Telephone Encounter (Signed)
[place] and spoke with pharmacist [place].  Informed her that per Dr. Jenny Reichmann it okay to fill the prescription for Ativan.

## 2019-09-14 ENCOUNTER — Ambulatory Visit (INDEPENDENT_AMBULATORY_CARE_PROVIDER_SITE_OTHER): Payer: Medicare Other

## 2019-09-14 ENCOUNTER — Telehealth: Payer: Self-pay | Admitting: Internal Medicine

## 2019-09-14 DIAGNOSIS — J309 Allergic rhinitis, unspecified: Secondary | ICD-10-CM | POA: Diagnosis not present

## 2019-09-14 NOTE — Telephone Encounter (Signed)
Copied from [place] [phone]. Topic: General - Other >> Sep 14, 2019 11:29 AM Keene Breath wrote: Reason for CRM: Patient called to ask the nurse or doctor to call regarding her nerve medication.  CB# [phone]

## 2019-09-14 NOTE — Telephone Encounter (Signed)
[place] to verify Rx Antivan-- refilled 09/13/19 by Dr. Jenny Reichmann and will be ready for pickup today around 3:00

## 2019-09-15 ENCOUNTER — Other Ambulatory Visit: Payer: Self-pay | Admitting: Internal Medicine

## 2019-09-15 MED ORDER — ONDANSETRON HCL 4 MG PO TABS: 4.0000 mg | ORAL_TABLET | Freq: Three times a day (TID) | ORAL | 0 refills | Status: DC | PRN

## 2019-09-15 MED ORDER — MECLIZINE HCL 12.5 MG PO TABS: 12.5000 mg | ORAL_TABLET | Freq: Three times a day (TID) | ORAL | 1 refills | Status: DC | PRN

## 2019-09-15 NOTE — Telephone Encounter (Signed)
Last seen 08/19/19--please advise

## 2019-09-15 NOTE — Telephone Encounter (Signed)
Please advise 

## 2019-09-15 NOTE — Telephone Encounter (Signed)
Copied from [place] [phone]. Topic: Quick Communication - Rx Refill/Question >> Sep 15, 2019  8:30 AM Rainey Pines A wrote: Medication: Medication for diarrhea, nausea, and amoxicillian  Has the patient contacted their pharmacy? Yes (Agent: If no, request that the patient contact the pharmacy for the refill.) (Agent: If yes, when and what did the pharmacy advise?)Contact PCP  Preferred Pharmacy (with phone number or street name): [place], [place]  Phone:  [phone] Fax:  [phone]     Agent: Please be advised that RX refills may take up to 3 business days. We ask that you follow-up with your pharmacy.

## 2019-09-15 NOTE — Telephone Encounter (Signed)
Requested medication (s) are due for refill today: yes  Requested medication (s) are on the active medication list: yes  Last refill:  08/19/2019  Future visit scheduled: yes  Notes to clinic:  Medication not assigned to a protocol, review manually Patient also wanting medicaiton for diarrhea    Requested Prescriptions  Pending Prescriptions Disp Refills   amoxicillin (AMOXIL) 500 MG capsule 40 capsule 0    Sig: Take 2 capsules (1,000 mg total) by mouth 2 (two) times daily.      Off-Protocol Failed - 09/15/2019  8:35 AM      Failed - Medication not assigned to a protocol, review manually.      Passed - Valid encounter within last 12 months    Recent Outpatient Visits           3 weeks ago Hyperglycemia   [place] John, James W, MD   1 month ago Acute right-sided low back pain without sciatica   [place] John, James W, MD   4 months ago Anxiety state   [place] John, James W, MD   6 months ago COPD exacerbation [facility])   [facility] -Georges Mouse, MD   8 months ago Pain   [place], James W, MD       Future Appointments             In 1 month Jenny Reichmann Hunt Oris, MD [place] at [facility]   In 3 months Valentina Shaggy, MD Allergy and [place]   In 9 months Marlou Sa, Tonna Corner, MD [facility] [place]              ondansetron (ZOFRAN) 4 MG tablet 30 tablet 0    Sig: Take 1 tablet (4 mg total) by mouth every 8 (eight) hours as needed for nausea or vomiting.      Not Delegated - Gastroenterology: Antiemetics Failed - 09/15/2019  8:35 AM      Failed - This refill cannot be delegated      Passed - Valid encounter within last 6 months    Recent Outpatient Visits           3 weeks ago Hyperglycemia   [place] John, James W, MD   1 month ago Acute right-sided  low back pain without sciatica   [place] John, James W, MD   4 months ago Anxiety state   [place] [facility] [facility] -Georges Mouse, MD   6 months ago COPD exacerbation [facility])   [place] [facility] -Georges Mouse, MD   8 months ago Pain   [place], James W, MD       Future Appointments             In 1 month Jenny Reichmann Hunt Oris, MD [place] at [facility]   In 3 months Valentina Shaggy, MD Allergy and [place]   In 9 months Marlou Sa, Tonna Corner, MD [facility] [place]              meclizine (ANTIVERT) 12.5 MG tablet 270 tablet 1    Sig: Take 1 tablet (12.5 mg total) by mouth 3 (three) times daily as needed for dizziness.      Not Delegated - Gastroenterology: Antiemetics Failed - 09/15/2019  8:35  AM      Failed - This refill cannot be delegated      Passed - Valid encounter within last 6 months    Recent Outpatient Visits           3 weeks ago Hyperglycemia   [place] John, James W, MD   1 month ago Acute right-sided low back pain without sciatica   [place] John, James W, MD   4 months ago Anxiety state   [place] John, James W, MD   6 months ago COPD exacerbation [facility])   [place] [facility] -Georges Mouse, MD   8 months ago Pain   [facility] [place] -[facility] Mouse, MD       Future Appointments             In 1 month Jenny Reichmann Hunt Oris, MD [place] at [facility]   In 3 months Ernst Bowler Gwenith Daily, MD Allergy and [place]   In 9 months Marlou Sa, Tonna Corner, MD [facility]

## 2019-09-15 NOTE — Telephone Encounter (Signed)
Patient states she is having diarrhea and she is having yellow chest congestion.  States chest congestion is not coming up much.  Please advise.

## 2019-09-15 NOTE — Telephone Encounter (Signed)
Medical screening examination/treatment/procedure(s) were performed by non-physician practitioner and as supervising physician I was immediately available for consultation/collaboration. I agree with above. Alannie Amodio, MD   

## 2019-09-15 NOTE — Telephone Encounter (Signed)
Unless there is worsening fever, or shortness of breath though I would not take then antibiotic again (she just finished the amoxil)

## 2019-09-15 NOTE — Telephone Encounter (Signed)
Ok for zofran and antivert refills, but I would not refill the amoxil unless worsening fever, pain or other symptoms to suggest she needs this

## 2019-09-17 ENCOUNTER — Encounter: Payer: Self-pay | Admitting: Internal Medicine

## 2019-09-17 NOTE — Patient Instructions (Signed)
Please continue all other medications as before, and refills have been done if requested.  Please have the pharmacy call with any other refills you may need.  Please keep your appointments with your specialists as you may have planned    

## 2019-09-17 NOTE — Assessment & Plan Note (Signed)
See notes

## 2019-09-20 NOTE — Telephone Encounter (Signed)
LVM--pt return call to the office regarding medication

## 2019-09-21 IMAGING — CT CT L SPINE W/O CM
1 of 8 series · 4 of 14 positions shown, 5 images · non-contrast
Comparison: MRI of the lumbar spine 01/18/2016. Lumbar spine
radiographs 02/14/2016.

CLINICAL DATA: Close compression fracture of the L2 vertebral body.
Initial encounter. Back pain for greater 6 weeks. Persistent
symptoms despite conservative treatment.

EXAM:
CT LUMBAR SPINE WITHOUT CONTRAST
TECHNIQUE: Multidetector CT imaging of the lumbar spine was performed without
intravenous contrast administration. Multiplanar CT image
reconstructions were also generated.

[Series 2: l spine soft · axial · 0.29mm/px · z∈[-714,-588]mm · 4 of 72 slices shown, 5 images]
[im 15/72  soft-tissue]
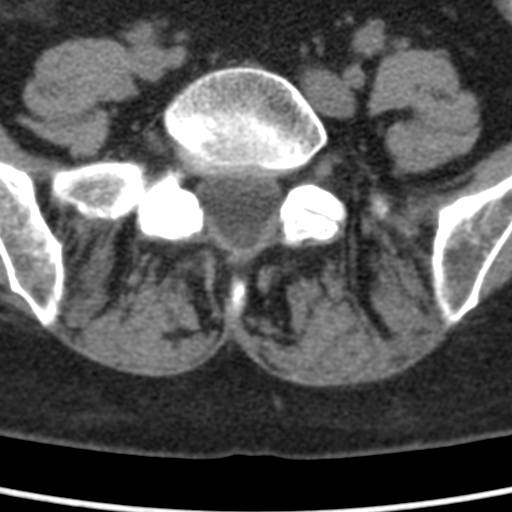
[im 15/72  bone]
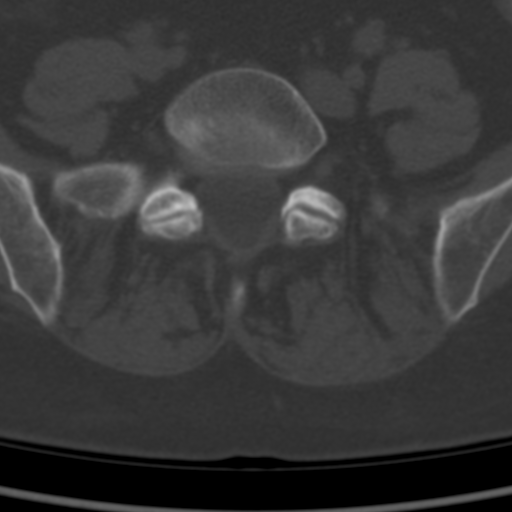
[im 29/72  bone]
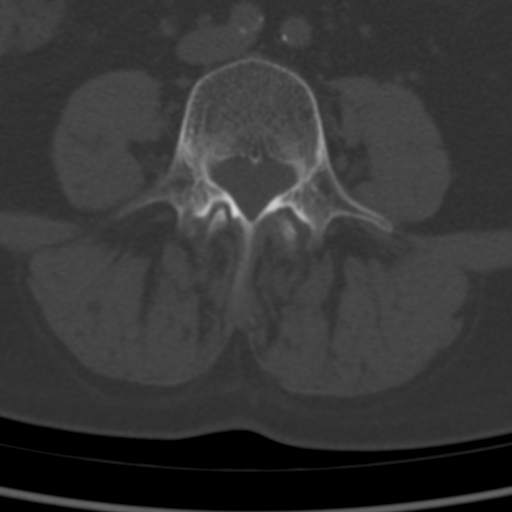
[im 43/72  bone]
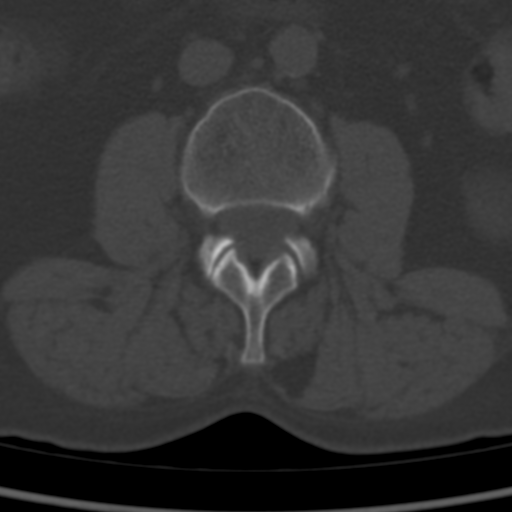
[im 57/72  bone]
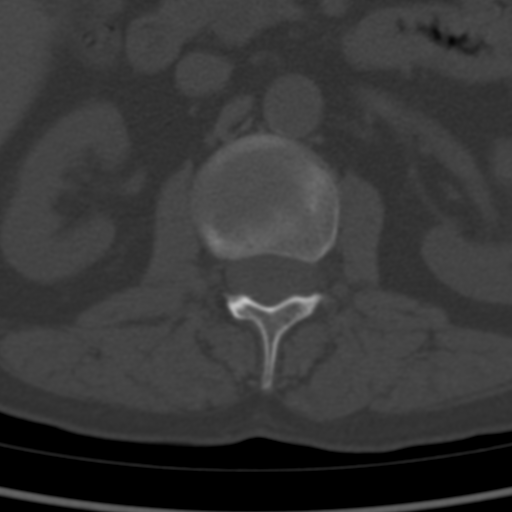

[4 of 14 positions shown; findings below may reference images not displayed]

FINDINGS: Segmentation: 5 non rib-bearing lumbar type vertebral bodies are
present.

Alignment: AP alignment is anatomic. Mild leftward curvature is
centered at L5. Rightward curvature is centered at L1.

Vertebrae: Mild irregularity along the superior endplates of L1, L2,
and L3 are stable, compatible with Schmorl's nodes. No acute or
healing fracture is evident.

Paraspinal and other soft tissues: Atherosclerotic calcifications
are present in the aorta without aneurysm. No significant adenopathy
is present. Surgical clips are present at the gallbladder fossa.

Disc levels: T12-L1:  Negative.

L1-2: Mild facet hypertrophy is present. There is no significant
disc protrusion or stenosis.

L2-3: Mild facet hypertrophy is present bilaterally. There is no
focal disc protrusion or stenosis.

L3-4: Mild facet hypertrophy is present bilaterally. A limbus
vertebra is noted anteriorly. There is no focal protrusion or
stenosis. The foramina are patent bilaterally.

L4-5: A rightward broad-based disc protrusion is present. Asymmetric
right-sided facet hypertrophy is present. Mild right subarticular
narrowing is present. The foramina are patent.

L5-S1: Mild facet hypertrophy is present. There is no focal disc
protrusion or stenosis.
IMPRESSION: 1. Progressive degenerative changes at L[DATE] impact the
traversing right L5 nerve root within the right subarticular space.
2. Multilevel facet hypertrophy without other focal disc protrusion
or stenosis.

## 2019-09-21 NOTE — Telephone Encounter (Signed)
Called left voicemail for patient to call office

## 2019-10-01 ENCOUNTER — Telehealth: Payer: Self-pay | Admitting: *Deleted

## 2019-10-01 DIAGNOSIS — E538 Deficiency of other specified B group vitamins: Secondary | ICD-10-CM

## 2019-10-01 NOTE — Telephone Encounter (Signed)
Copied from [place] [id number]. Topic: General - Other >> Oct 01, 2019  7:38 AM Rayann Heman wrote: Reason for CRM: pt called and stated that she would like to schedule a B12 lab the same morning she has a B12 shot. 10/08/19. Please advise

## 2019-10-01 NOTE — Telephone Encounter (Signed)
Lab ordered. Pt informed of below.

## 2019-10-07 ENCOUNTER — Telehealth: Payer: Self-pay | Admitting: Allergy & Immunology

## 2019-10-07 NOTE — Telephone Encounter (Signed)
Called and left a voicemail asking to the patient to return call to discuss.

## 2019-10-07 NOTE — Telephone Encounter (Signed)
Patient needs to bring new Ins card in order to get approval with new Ins

## 2019-10-07 NOTE — Telephone Encounter (Signed)
Please advise 

## 2019-10-07 NOTE — Telephone Encounter (Signed)
Patient called stating that her insurance will not cover her Lori [name] shot this year.Patient states she was told to call to the office to see if Dr. Ernst Bowler could recommend another medication.  Please advise.

## 2019-10-08 ENCOUNTER — Ambulatory Visit (INDEPENDENT_AMBULATORY_CARE_PROVIDER_SITE_OTHER): Payer: Medicare HMO | Admitting: *Deleted

## 2019-10-08 ENCOUNTER — Other Ambulatory Visit: Payer: Medicare HMO

## 2019-10-08 ENCOUNTER — Other Ambulatory Visit: Payer: Self-pay

## 2019-10-08 DIAGNOSIS — E538 Deficiency of other specified B group vitamins: Secondary | ICD-10-CM

## 2019-10-08 LAB — VITAMIN B12: Vitamin B-12: 215 pg/mL (ref 211–911)

## 2019-10-08 MED ORDER — CYANOCOBALAMIN 1000 MCG/ML IJ SOLN
1000.0000 ug | Freq: Once | INTRAMUSCULAR | Status: AC
  Administered 2019-10-08: 10:00:00 1000 ug via INTRAMUSCULAR

## 2019-10-08 NOTE — Telephone Encounter (Signed)
It seems that Tammy has this under control.  I will let her do her magic.  Salvatore Marvel, MD Allergy and [facility]

## 2019-10-08 NOTE — Telephone Encounter (Signed)
error 

## 2019-10-08 NOTE — Addendum Note (Signed)
Addended by: Karle Barr on: 10/08/2019 09:48 AM   Modules accepted: Orders

## 2019-10-08 NOTE — Telephone Encounter (Signed)
Pt came in today for B12 and B12 lab.   Reentered order. It would not let me change the existing order.  Pt added to nurse schedule.

## 2019-10-09 IMAGING — CR DG CHEST 2V
2 series · 2 of 2 positions shown · non-contrast
Comparison: July 18, 2017 and June 24, 2017

CLINICAL DATA: Chest pain.  Shortness of breath.

EXAM:
CHEST  2 VIEW

[chest lat]
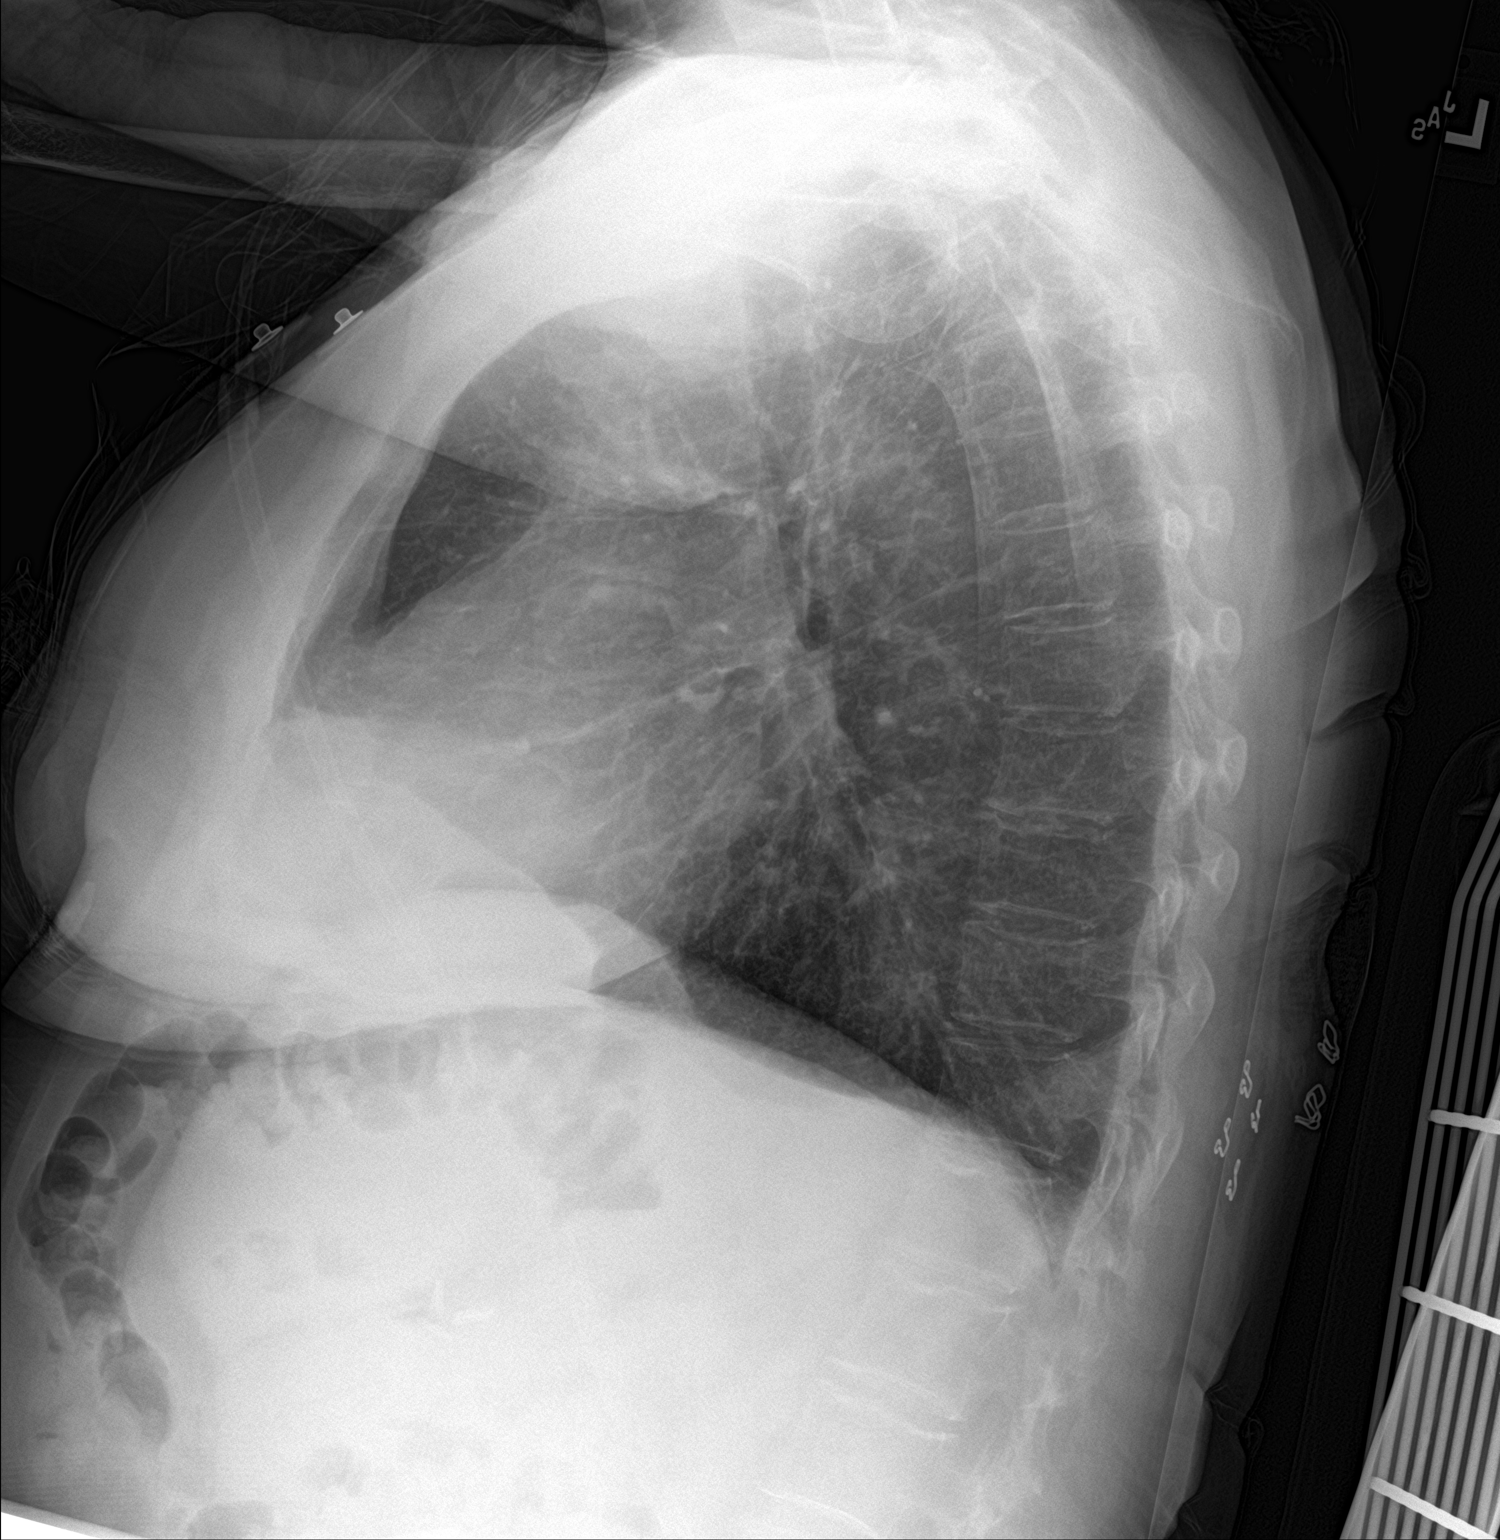

[chest ap]
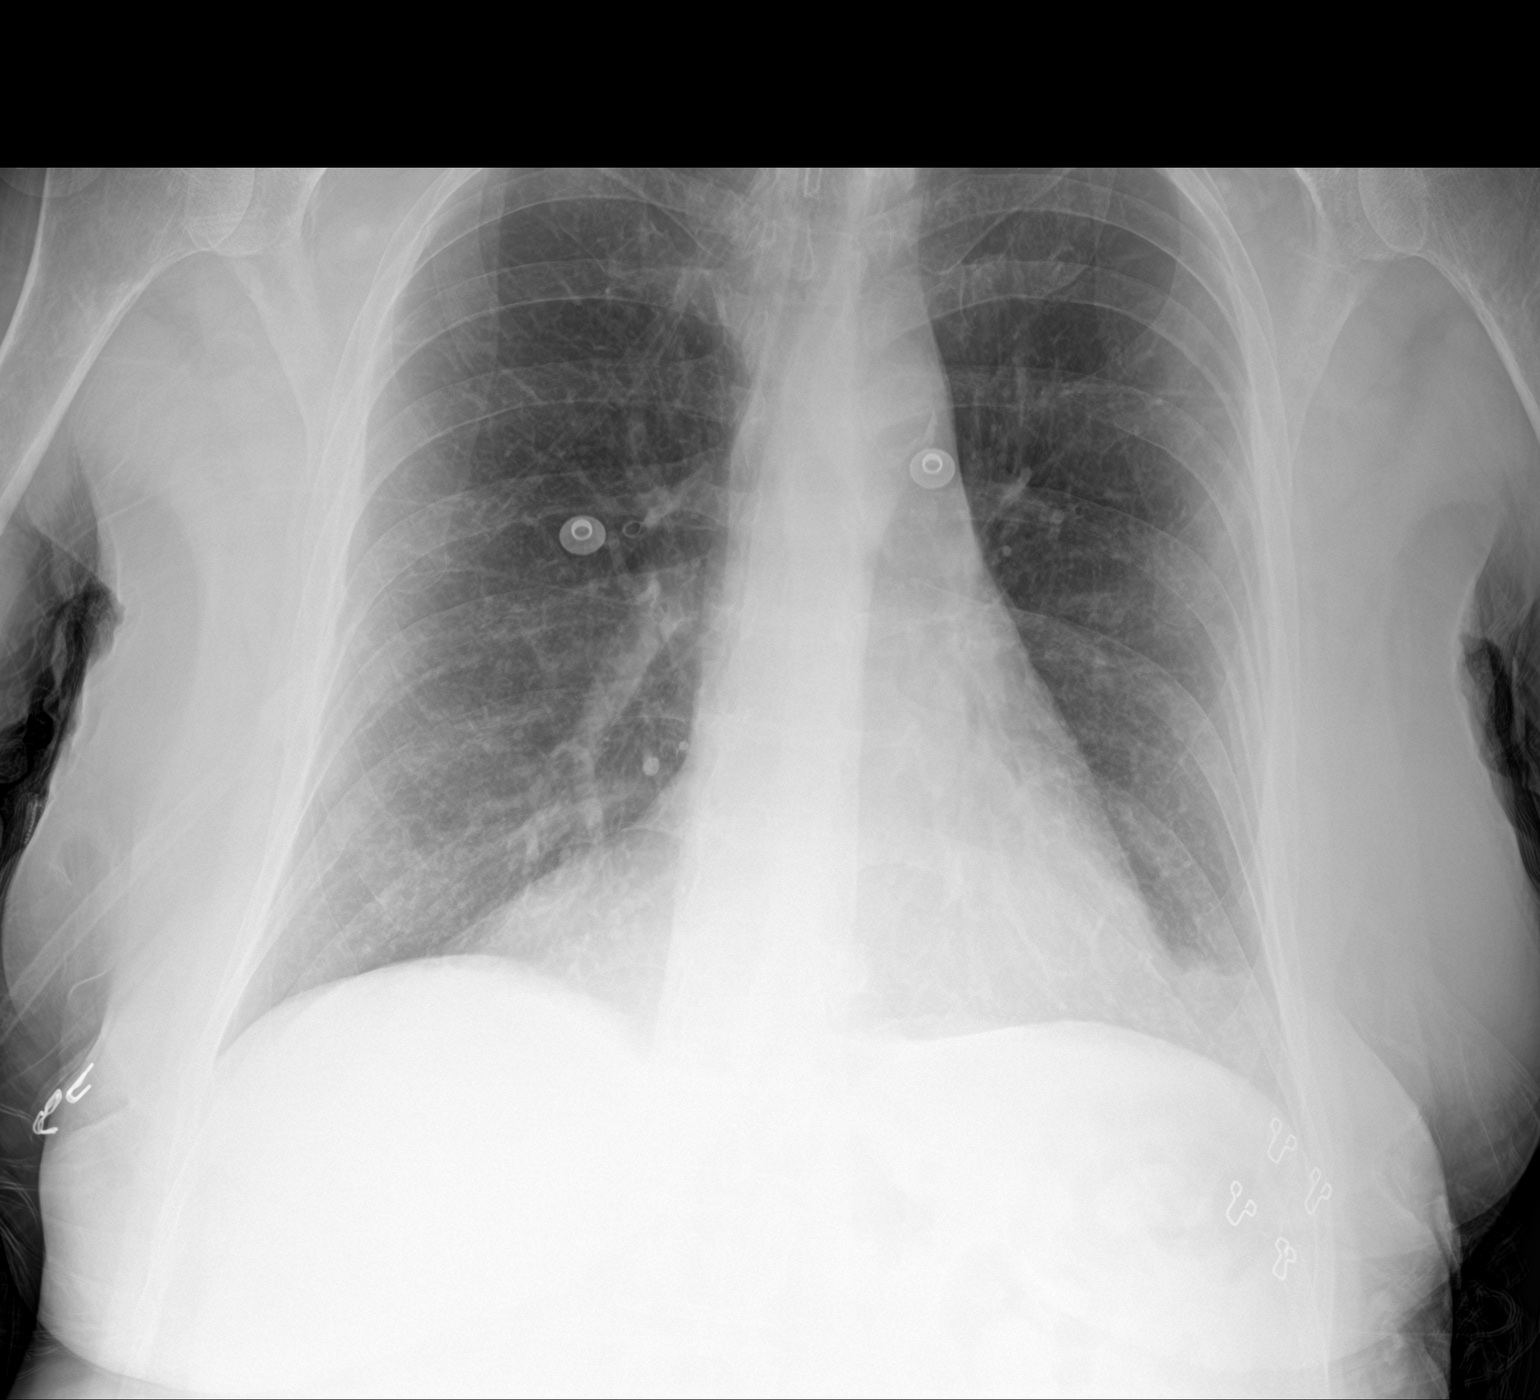

[2 of 2 positions shown; findings below may reference images not displayed]

FINDINGS: A prominent right epicardial fat pad is stable. The heart, hila,
mediastinum, lungs, and pleura are otherwise unchanged.
Pleuroparenchymal thickening in the apices, left greater than right,
persist and is stable. No acute interval change.
IMPRESSION: No acute abnormalities.

## 2019-10-11 NOTE — Telephone Encounter (Signed)
Patient called me this morning to check up on her asthma shot. She never got a call back from our office on Friday about her asthma shot and she is very worried that she is going to have to pay $6000 for it. She is wondering whether she needs to rescheduled her appointment on February 4th.  I assured her that we could use a sample if needed. I also assured her that Lynelle Smoke was working on this. She is requesting a call from Angie today.   In other news, she found out that her husband who recently passed away was never actually divorced from his first wife. Therefore, his first wife is getting all of his benefits. Ms. Hellams is livid about this.    Salvatore Marvel, MD Allergy and [facility]

## 2019-10-12 DIAGNOSIS — G43719 Chronic migraine without aura, intractable, without status migrainosus: Secondary | ICD-10-CM | POA: Diagnosis not present

## 2019-10-12 NOTE — Telephone Encounter (Signed)
L/M for patient to contact me to discuss getting her medication through patient assistance now since her out of pocket unaffordable

## 2019-10-13 NOTE — Telephone Encounter (Signed)
Patient called and talked to Tammy about getting her insurance to cover [place]. Patient would like to talk to Dr. Ernst Bowler about getting a sample of Fasenra in the meantime.   Please advise.

## 2019-10-13 NOTE — Telephone Encounter (Signed)
Advised patient that since sh has high copay and doesn't look like she is getting extra like she was before will need to send app and try to get free through patient assistance

## 2019-10-18 ENCOUNTER — Other Ambulatory Visit: Payer: Self-pay | Admitting: Internal Medicine

## 2019-10-18 NOTE — Telephone Encounter (Signed)
flexeri done erx  Not sure why she would need the valtrex

## 2019-10-19 ENCOUNTER — Other Ambulatory Visit: Payer: Self-pay | Admitting: Internal Medicine

## 2019-10-21 ENCOUNTER — Ambulatory Visit: Payer: Self-pay

## 2019-10-26 ENCOUNTER — Encounter: Payer: Self-pay | Admitting: Internal Medicine

## 2019-10-26 ENCOUNTER — Other Ambulatory Visit: Payer: Self-pay | Admitting: Internal Medicine

## 2019-10-26 ENCOUNTER — Ambulatory Visit: Payer: Medicare Other | Admitting: Internal Medicine

## 2019-10-26 ENCOUNTER — Other Ambulatory Visit: Payer: Self-pay

## 2019-10-26 VITALS — BP 124/76 | HR 80 | Temp 98.4°F | Ht 64.0 in | Wt 125.0 lb

## 2019-10-26 DIAGNOSIS — G43719 Chronic migraine without aura, intractable, without status migrainosus: Secondary | ICD-10-CM | POA: Diagnosis not present

## 2019-10-26 NOTE — Telephone Encounter (Signed)
Both addressed feb 1  Yes to muscle relaxer  No to valtrex - should not need further, I believe she had this one time for shingles

## 2019-10-27 ENCOUNTER — Encounter: Payer: Self-pay | Admitting: Internal Medicine

## 2019-10-27 MED ORDER — CYCLOBENZAPRINE HCL 5 MG PO TABS: 5.0000 mg | ORAL_TABLET | Freq: Three times a day (TID) | ORAL | 2 refills | Status: DC | PRN

## 2019-10-27 MED ORDER — MONTELUKAST SODIUM 10 MG PO TABS: 10.0000 mg | ORAL_TABLET | Freq: Every day | ORAL | 3 refills | Status: DC

## 2019-10-27 MED ORDER — DEXILANT 60 MG PO CPDR: DELAYED_RELEASE_CAPSULE | ORAL | 3 refills | Status: DC

## 2019-11-03 ENCOUNTER — Other Ambulatory Visit: Payer: Self-pay | Admitting: Internal Medicine

## 2019-11-04 ENCOUNTER — Other Ambulatory Visit: Payer: Self-pay | Admitting: Internal Medicine

## 2019-11-04 ENCOUNTER — Encounter: Payer: Medicare Other | Admitting: Internal Medicine

## 2019-11-04 NOTE — Telephone Encounter (Signed)
Same answer as the last 3 times

## 2019-11-05 ENCOUNTER — Telehealth: Payer: Self-pay

## 2019-11-05 NOTE — Telephone Encounter (Signed)
LVM for pt to call back as soon as possible.   RE: what is the reason pt uses Valacyclovir

## 2019-11-05 NOTE — Telephone Encounter (Signed)
Again, I/m not sure why she needs this  Perhaps she could clarify why and I can consider refill

## 2019-11-05 NOTE — Telephone Encounter (Signed)
Oral herpes.

## 2019-11-05 NOTE — Telephone Encounter (Signed)
Medication Requested: Valacyclovir 500 mg   Is medication on med list: No  (if no, inform pt they may need an appointment)  Is medication a controled: No  (yes = last OV with PCP)  -Controlled Substances: Adderall, Ritalin, oxycodone, hydrocodone, methadone, alprazolam, etc  Last visit with PCP: 12.23.20   Is the OV > than 4 months: (yes = schedule an appt if one is not already made)  Pharmacy (Name, [place]): [facility] on [address]

## 2019-11-05 NOTE — Telephone Encounter (Signed)
F/u   The patient saying the medication is for Herpes.

## 2019-11-05 NOTE — Telephone Encounter (Signed)
But does she mean oral herpes, genital herpes, or maybe shingles?

## 2019-11-06 MED ORDER — VALACYCLOVIR HCL 1 G PO TABS: 1000.0000 mg | ORAL_TABLET | Freq: Three times a day (TID) | ORAL | 5 refills | Status: DC | PRN

## 2019-11-10 ENCOUNTER — Telehealth: Payer: Self-pay | Admitting: Allergy & Immunology

## 2019-11-10 NOTE — Telephone Encounter (Signed)
Left message on machine for patient to call back to discuss about discount program.

## 2019-11-10 NOTE — Telephone Encounter (Signed)
Please advise last note you stated patient need assistance program.

## 2019-11-10 NOTE — Telephone Encounter (Signed)
Patient returned call to the office.  Informed patient that Tammy mailed her application to fill out to see if she could get Berna Bue free.  Patient needs to complete application and return to [facility] or the office so we can send to [facility].   Patient wants phone visit with Dr. Ernst Bowler to discuss if there are any other options other than injections.  Patient states Dr. Ernst Bowler discussed pills she could take.  Patient was given appointment for Televisit with Dr. Ernst Bowler on 11/16/2019 and informed I would let Dr. Ernst Bowler and Tammy know she returned call.   Patient voiced understanding.

## 2019-11-10 NOTE — Telephone Encounter (Signed)
Patient called and said that she can not afford the [facility] and needs some thing for her asthma. [place] [phone]

## 2019-11-10 NOTE — Telephone Encounter (Signed)
I have mailed patient application to her to fill out and return to me to get free drug. Still awaiting her to send back to me

## 2019-11-12 ENCOUNTER — Ambulatory Visit (INDEPENDENT_AMBULATORY_CARE_PROVIDER_SITE_OTHER): Payer: Medicare Other | Admitting: Internal Medicine

## 2019-11-12 ENCOUNTER — Encounter: Payer: Self-pay | Admitting: Internal Medicine

## 2019-11-12 ENCOUNTER — Other Ambulatory Visit: Payer: Self-pay

## 2019-11-12 ENCOUNTER — Ambulatory Visit (INDEPENDENT_AMBULATORY_CARE_PROVIDER_SITE_OTHER): Payer: Medicare Other

## 2019-11-12 VITALS — BP 116/78 | HR 68 | Temp 97.9°F | Ht 64.0 in | Wt 125.0 lb

## 2019-11-12 DIAGNOSIS — Z0001 Encounter for general adult medical examination with abnormal findings: Secondary | ICD-10-CM | POA: Diagnosis not present

## 2019-11-12 DIAGNOSIS — E538 Deficiency of other specified B group vitamins: Secondary | ICD-10-CM | POA: Diagnosis not present

## 2019-11-12 DIAGNOSIS — G4700 Insomnia, unspecified: Secondary | ICD-10-CM

## 2019-11-12 DIAGNOSIS — M5441 Lumbago with sciatica, right side: Secondary | ICD-10-CM

## 2019-11-12 DIAGNOSIS — M545 Low back pain: Secondary | ICD-10-CM | POA: Diagnosis not present

## 2019-11-12 DIAGNOSIS — E785 Hyperlipidemia, unspecified: Secondary | ICD-10-CM

## 2019-11-12 DIAGNOSIS — A6000 Herpesviral infection of urogenital system, unspecified: Secondary | ICD-10-CM

## 2019-11-12 DIAGNOSIS — M533 Sacrococcygeal disorders, not elsewhere classified: Secondary | ICD-10-CM | POA: Diagnosis not present

## 2019-11-12 DIAGNOSIS — G8929 Other chronic pain: Secondary | ICD-10-CM

## 2019-11-12 DIAGNOSIS — D509 Iron deficiency anemia, unspecified: Secondary | ICD-10-CM

## 2019-11-12 DIAGNOSIS — E559 Vitamin D deficiency, unspecified: Secondary | ICD-10-CM | POA: Diagnosis not present

## 2019-11-12 DIAGNOSIS — R739 Hyperglycemia, unspecified: Secondary | ICD-10-CM | POA: Diagnosis not present

## 2019-11-12 LAB — URINALYSIS, ROUTINE W REFLEX MICROSCOPIC
Bilirubin Urine: NEGATIVE
Hgb urine dipstick: NEGATIVE
Ketones, ur: NEGATIVE
Leukocytes,Ua: NEGATIVE
Nitrite: NEGATIVE
RBC / HPF: NONE SEEN (ref 0–?)
Specific Gravity, Urine: 1.025 (ref 1.000–1.030)
Total Protein, Urine: NEGATIVE
Urine Glucose: NEGATIVE
Urobilinogen, UA: 0.2 (ref 0.0–1.0)
WBC, UA: NONE SEEN (ref 0–?)
pH: 5.5 (ref 5.0–8.0)

## 2019-11-12 LAB — CBC WITH DIFFERENTIAL/PLATELET
Basophils Absolute: 0 10*3/uL (ref 0.0–0.1)
Basophils Relative: 0.2 % (ref 0.0–3.0)
Eosinophils Absolute: 0 10*3/uL (ref 0.0–0.7)
Eosinophils Relative: 0 % (ref 0.0–5.0)
HCT: 38.3 % (ref 36.0–46.0)
Hemoglobin: 12.8 g/dL (ref 12.0–15.0)
Lymphocytes Relative: 44.1 % (ref 12.0–46.0)
Lymphs Abs: 3.9 10*3/uL (ref 0.7–4.0)
MCHC: 33.4 g/dL (ref 30.0–36.0)
MCV: 83.9 fl (ref 78.0–100.0)
Monocytes Absolute: 0.5 10*3/uL (ref 0.1–1.0)
Monocytes Relative: 5.1 % (ref 3.0–12.0)
Neutro Abs: 4.5 10*3/uL (ref 1.4–7.7)
Neutrophils Relative %: 50.6 % (ref 43.0–77.0)
Platelets: 279 10*3/uL (ref 150.0–400.0)
RBC: 4.56 Mil/uL (ref 3.87–5.11)
RDW: 14.2 % (ref 11.5–15.5)
WBC: 8.8 10*3/uL (ref 4.0–10.5)

## 2019-11-12 LAB — HEPATIC FUNCTION PANEL
ALT: 9 U/L (ref 0–35)
AST: 9 U/L (ref 0–37)
Albumin: 4.1 g/dL (ref 3.5–5.2)
Alkaline Phosphatase: 103 U/L (ref 39–117)
Bilirubin, Direct: 0 mg/dL (ref 0.0–0.3)
Total Bilirubin: 0.3 mg/dL (ref 0.2–1.2)
Total Protein: 6.6 g/dL (ref 6.0–8.3)

## 2019-11-12 LAB — IBC PANEL
Iron: 43 ug/dL (ref 42–145)
Saturation Ratios: 10.9 % — ABNORMAL LOW (ref 20.0–50.0)
Transferrin: 281 mg/dL (ref 212.0–360.0)

## 2019-11-12 LAB — LIPID PANEL
Cholesterol: 246 mg/dL — ABNORMAL HIGH (ref 0–200)
HDL: 29.4 mg/dL — ABNORMAL LOW (ref >39.00)
NonHDL: 216.68
Total CHOL/HDL Ratio: 8
Triglycerides: 388 mg/dL — ABNORMAL HIGH (ref 0.0–149.0)
VLDL: 77.6 mg/dL — ABNORMAL HIGH (ref 0.0–40.0)

## 2019-11-12 LAB — BASIC METABOLIC PANEL
BUN: 11 mg/dL (ref 6–23)
CO2: 22 mEq/L (ref 19–32)
Calcium: 9.1 mg/dL (ref 8.4–10.5)
Chloride: 109 mEq/L (ref 96–112)
Creatinine, Ser: 1.07 mg/dL (ref 0.40–1.20)
GFR: 51.75 mL/min — ABNORMAL LOW (ref >60.00)
Glucose, Bld: 80 mg/dL (ref 70–99)
Potassium: 3.6 mEq/L (ref 3.5–5.1)
Sodium: 138 mEq/L (ref 135–145)

## 2019-11-12 LAB — TSH: TSH: 1.45 u[IU]/mL (ref 0.35–4.50)

## 2019-11-12 LAB — FERRITIN: Ferritin: 7.4 ng/mL — ABNORMAL LOW (ref 10.0–291.0)

## 2019-11-12 LAB — HEMOGLOBIN A1C: Hgb A1c MFr Bld: 5.6 % (ref 4.6–6.5)

## 2019-11-12 LAB — LDL CHOLESTEROL, DIRECT: Direct LDL: 157 mg/dL

## 2019-11-12 LAB — VITAMIN D 25 HYDROXY (VIT D DEFICIENCY, FRACTURES): VITD: 15.17 ng/mL — ABNORMAL LOW (ref 30.00–100.00)

## 2019-11-12 MED ORDER — HYDROCODONE-ACETAMINOPHEN 5-325 MG PO TABS: 1.0000 | ORAL_TABLET | Freq: Four times a day (QID) | ORAL | 0 refills | Status: DC | PRN

## 2019-11-12 MED ORDER — CYANOCOBALAMIN 1000 MCG/ML IJ SOLN
1000.0000 ug | Freq: Once | INTRAMUSCULAR | Status: AC
  Administered 2019-11-12: 1000 ug via INTRAMUSCULAR

## 2019-11-12 MED ORDER — ALPRAZOLAM 1 MG PO TABS: 1.0000 mg | ORAL_TABLET | Freq: Every evening | ORAL | 1 refills | Status: DC | PRN

## 2019-11-12 MED ORDER — VALACYCLOVIR HCL 1 G PO TABS: 1000.0000 mg | ORAL_TABLET | Freq: Two times a day (BID) | ORAL | 3 refills | Status: DC

## 2019-11-12 MED ORDER — POLYSACCHARIDE IRON COMPLEX 150 MG PO CAPS: 150.0000 mg | ORAL_CAPSULE | Freq: Every day | ORAL | 1 refills | Status: DC

## 2019-11-12 NOTE — Progress Notes (Signed)
Subjective:    Patient ID: Lori [name], female    DOB: 08/24/1956, 64 y.o.   MRN: [id number]  HPI  Here for wellness and f/u;  Overall doing ok;  Pt denies Chest pain, worsening SOB, DOE, wheezing, orthopnea, PND, worsening LE edema, palpitations, dizziness or syncope.  Pt denies neurological change such as new headache, facial or extremity weakness.  Pt denies polydipsia, polyuria, or low sugar symptoms. Pt states overall good compliance with treatment and medications, good tolerability, and has been trying to follow appropriate diet.  Pt denies worsening depressive symptoms, suicidal ideation or panic. No fever, night sweats, wt loss, loss of appetite, or other constitutional symptoms.  Pt states good ability with ADL's, has low fall risk, home safety reviewed and adequate, no other significant changes in hearing or vision, and only occasionally active with exercise.   Also fell 2 wks ago with persistent pain to the low back and tailbone area, also persistent insomnia for several months, also need valtrex for recuring genital herpes rash, due for b12 shot, no recent overt bleeding, Pt continues to have recurring LBP as above without bowel or bladder change, fever, wt loss,  worsening LE pain/numbness/weakness, gait change or falls Does not want to consider statin. Past Medical History:  Diagnosis Date  . Acute MI ([place])    x3 - by report only. She has had 3 negative Myoview stress test with no evidence of prior infarct.  . Allergic rhinitis   . Allergy   . Anemia   . Ankle fracture 05/2016  . Anxiety   . Arthritis   . Asthma   . Barrett esophagus 2007  . Chest pain 05-02-2009   echo  EF 55%  . COPD (chronic obstructive pulmonary disease) with chronic bronchitis (HCC)    & Emphysema  . Depression   . Duodenitis without mention of hemorrhage 2007  . Esophageal reflux 2007  . Esophageal stricture   . Full dentures   . H/O hiatal hernia   . Hiatal hernia [id number]  . Hyperlipidemia     . Multiple fractures    from falls, fx rt. elbow, fx left wrist, bilateral ankles  . Pneumonia 10/2016  . Restrictive lung disease 06/30/2017  . Ulcer    Past Surgical History:  Procedure Laterality Date  . ABDOMINAL HYSTERECTOMY     BSO  . APPENDECTOMY    . CESAREAN SECTION     x 2  . CHOLECYSTECTOMY    . CHONDROPLASTY Left 01/06/2015   Procedure: CHONDROPLASTY;  Surgeon: Marybelle Killings, MD;  Location: [place];  Service: Orthopedics;  Laterality: Left;  . COLONOSCOPY    . ELBOW FRACTURE SURGERY     right  . KNEE ARTHROSCOPY WITH EXCISION PLICA Left 0000000   Procedure: KNEE ARTHROSCOPY WITH EXCISION PLICA;  Surgeon: Marybelle Killings, MD;  Location: [place];  Service: Orthopedics;  Laterality: Left;  . Lower Extremity Arterial Dopplers  07/06/2013   RABI 1.0, LABI 1.1.; No evidence of significant vascular atherogenic plaque  . NECK SURGERY    . NM MYOVIEW LTD  09/2014   LOW RISK. Normal EF of 65% with no regional wall motion abnormalities. No ischemia or infarction.  . TONSILLECTOMY    . WRIST FRACTURE SURGERY     left, has plate    reports that she has been smoking cigarettes. She started smoking about 42 years ago. She has a 19.50 pack-year smoking history. She has never used smokeless tobacco. She  reports that she does not drink alcohol or use drugs. family history includes Dementia in her mother; Diabetes in her maternal aunt, maternal grandmother, and another family member; Emphysema in her father; Heart disease in her brother. Allergies  Allergen Reactions  . Bee Venom Swelling  . Codeine Nausea Only and Other (See Comments)    CAUSES ULCERS  . Ibuprofen Nausea And Vomiting  . Clonidine Derivatives Other (See Comments)    Unknown   . Tramadol Nausea Only  . Latex Rash  . Levofloxacin Nausea Only  . Propoxyphene N-Acetaminophen Nausea Only   Current Outpatient Medications on File Prior to Visit  Medication Sig Dispense Refill  .  atorvastatin (LIPITOR) 20 MG tablet Take 1 tablet (20 mg total) by mouth daily. 90 tablet 3  . citalopram (CELEXA) 10 MG tablet Take 1 tablet (10 mg total) by mouth daily. 90 tablet 3  . cyclobenzaprine (FLEXERIL) 5 MG tablet Take 1 tablet (5 mg total) by mouth 3 (three) times daily as needed. for muscle spams 60 tablet 2  . DEXILANT 60 MG capsule TAKE 1 CAPSULE BY MOUTH  DAILY 90 capsule 3  . EPINEPHrine 0.3 mg/0.3 mL IJ SOAJ injection INJECT AS DIRECTED FOR SEVERE ALLERGIC REACTION 2 each 2  . FASENRA 30 MG/ML SOSY INJECT 30MG  SUBCUTANEOUSLY  EVERY 8 WEEKS (GIVEN AT  PRESCRIBERS OFFICE) 1 mL 8  . gabapentin (NEURONTIN) 100 MG capsule Take 1 capsule (100 mg total) by mouth 3 (three) times daily. 270 capsule 1  . levalbuterol (XOPENEX HFA) 45 MCG/ACT inhaler Use 4 puffs every 4-6 hours as needed. 1 Inhaler 2  . meclizine (ANTIVERT) 12.5 MG tablet Take 1 tablet (12.5 mg total) by mouth 3 (three) times daily as needed for dizziness. 270 tablet 1  . mometasone-formoterol (DULERA) 200-5 MCG/ACT AERO Inhale 2 puffs into the lungs daily for 30 days. 39 g 1  . mometasone-formoterol (DULERA) 200-5 MCG/ACT AERO Use 2 puffs once daily with spacer. 13 g 5  . montelukast (SINGULAIR) 10 MG tablet Take 1 tablet (10 mg total) by mouth at bedtime. 90 tablet 3  . nitroGLYCERIN (NITROSTAT) 0.4 MG SL tablet PLACE 1 TABLET UNDER THE TONGUE EVERY 5 MINUTES AS NEEDED FOR CHEST PAIN. 25 tablet 1  . ondansetron (ZOFRAN) 4 MG tablet Take 1 tablet (4 mg total) by mouth every 8 (eight) hours as needed for nausea or vomiting. 30 tablet 0  . PROAIR HFA 108 (90 Base) MCG/ACT inhaler Inhale 1-2 puffs into the lungs every 4 (four) hours as needed for shortness of breath or wheezing. 54 g 0  . rosuvastatin (CRESTOR) 20 MG tablet Take 1 tablet (20 mg total) by mouth daily. 90 tablet 3   Current Facility-Administered Medications on File Prior to Visit  Medication Dose Route Frequency Provider Last Rate Last Admin  . Benralizumab  SOSY 30 mg  30 mg Subcutaneous Q8 Toney Reil, MD   30 mg at 08/26/19 0901   Review of Systems All otherwise neg per pt     Objective:   Physical Exam BP 116/78   Pulse 68   Temp 97.9 F (36.6 C)   Ht 5\' 4"  (1.626 m)   Wt 125 lb (56.7 kg)   SpO2 99%   BMI 21.46 kg/m  VS noted,  Constitutional: Pt appears in NAD HENT: Head: NCAT.  Right Ear: External ear normal.  Left Ear: External ear normal.  Eyes: . Pupils are equal, round, and reactive to light. Conjunctivae and EOM are normal  Nose: without d/c or deformity Neck: Neck supple. Gross normal ROM Cardiovascular: Normal rate and regular rhythm.   Pulmonary/Chest: Effort normal and breath sounds without rales or wheezing.  Abd:  Soft, NT, ND, + BS, no organomegaly Neurological: Pt is alert. At baseline orientation, motor grossly intact Skin: Skin is warm. No rashes, other new lesions, no LE edema Psychiatric: Pt behavior is normal without agitation  All otherwise neg per pt Lab Results  Component Value Date   WBC 8.8 11/12/2019   HGB 12.8 11/12/2019   HCT 38.3 11/12/2019   PLT 279.0 11/12/2019   GLUCOSE 80 11/12/2019   CHOL 246 (H) 11/12/2019   TRIG 388.0 (H) 11/12/2019   HDL 29.40 (L) 11/12/2019   LDLDIRECT 157.0 11/12/2019   LDLCALC Comment 03/12/2017   ALT 9 11/12/2019   AST 9 11/12/2019   NA 138 11/12/2019   K 3.6 11/12/2019   CL 109 11/12/2019   CREATININE 1.07 11/12/2019   BUN 11 11/12/2019   CO2 22 11/12/2019   TSH 1.45 11/12/2019   INR 0.9 08/31/2008   HGBA1C 5.6 11/12/2019      Assessment & Plan:

## 2019-11-12 NOTE — Patient Instructions (Signed)
You are signed up for the [facility] wait list  You had the B12 shot today  [place] to change the lorazepam to the xanax at bedtime  Please take all new medication as prescribed - the iron pill medication for 6 months, and tramadol as needed for pain  Please continue all other medications as before, including the valtrex up to twice per day  Please have the pharmacy call with any other refills you may need.  Please continue your efforts at being more active, low cholesterol diet, and weight control.  You are otherwise up to date with prevention measures today.  Please keep your appointments with your specialists as you may have planned  Please go to the LAB at the blood drawing area for the tests to be done  You will be contacted by phone if any changes need to be made immediately.  Otherwise, you will receive a letter about your results with an explanation, but please check with MyChart first.  You will be contacted regarding the referral for: Gastroenterology for the low iron and possible bleeding  Please go to the XRAY Department in the first floor for the x-ray testing  Please also make you appointment at the [place] for the low back pain  Please make an Appointment to return in 3 months, or sooner if needed

## 2019-11-13 ENCOUNTER — Other Ambulatory Visit: Payer: Self-pay | Admitting: Internal Medicine

## 2019-11-13 ENCOUNTER — Encounter: Payer: Self-pay | Admitting: Internal Medicine

## 2019-11-13 MED ORDER — VITAMIN D (ERGOCALCIFEROL) 1.25 MG (50000 UNIT) PO CAPS: 50000.0000 [IU] | ORAL_CAPSULE | ORAL | 0 refills | Status: DC

## 2019-11-14 ENCOUNTER — Encounter: Payer: Self-pay | Admitting: Internal Medicine

## 2019-11-14 NOTE — Assessment & Plan Note (Signed)
For b12 1000 mg IM today,  to f/u any worsening symptoms or concerns

## 2019-11-14 NOTE — Assessment & Plan Note (Signed)
For oral replacement, refer GI

## 2019-11-14 NOTE — Assessment & Plan Note (Addendum)
For xray given recent fall and pain flare, pain control, refer sports medicine

## 2019-11-14 NOTE — Assessment & Plan Note (Signed)
stable overall by history and exam, recent data reviewed with pt, and pt to continue medical treatment as before,  to f/u any worsening symptoms or concerns  

## 2019-11-14 NOTE — Assessment & Plan Note (Signed)

## 2019-11-14 NOTE — Assessment & Plan Note (Addendum)
For xray post fall,  to f/u any worsening symptoms or concerns  I spent 42 minutes in addition to time for wellness examination in preparing to see the patient by review of recent labs, imaging and procedures, obtaining and reviewing separately obtained history, communicating with the patient and family or caregiver, ordering medications, tests or procedures, and documenting clinical information in the EHR including the differential Dx, treatment, and any further evaluation and other management of coccyx pain, LBP, genital herpes, insomnia, iron deficiency, b12 deficiency, HLD, hyperglycemia

## 2019-11-14 NOTE — Assessment & Plan Note (Signed)
For xanax qhs prn

## 2019-11-14 NOTE — Assessment & Plan Note (Signed)
For oral replacement 

## 2019-11-14 NOTE — Assessment & Plan Note (Signed)
Declines statin 

## 2019-11-14 NOTE — Assessment & Plan Note (Signed)
For valtrex refill 

## 2019-11-15 ENCOUNTER — Other Ambulatory Visit: Payer: Self-pay

## 2019-11-15 ENCOUNTER — Encounter: Payer: Self-pay | Admitting: Family Medicine

## 2019-11-15 ENCOUNTER — Ambulatory Visit (INDEPENDENT_AMBULATORY_CARE_PROVIDER_SITE_OTHER): Payer: Medicare Other | Admitting: Family Medicine

## 2019-11-15 VITALS — BP 110/78 | HR 87 | Ht 64.0 in | Wt 124.6 lb

## 2019-11-15 DIAGNOSIS — M7061 Trochanteric bursitis, right hip: Secondary | ICD-10-CM | POA: Diagnosis not present

## 2019-11-15 NOTE — Patient Instructions (Signed)
Thank you for coming in today. Do the exercises.  Recheck with me in 2-4 weeks.  If not better we may do PT.

## 2019-11-15 NOTE — Progress Notes (Signed)
Subjective:    CC: Low back pain  I, Lori [name], LAT, ATC, am serving as scribe for Dr. Lynne [name].  HPI: Pt is a 64 y/o female presenting w/ c/o R low back pain since Nov 2020 when she fell down some stairs at her home.  She notes that the pain worsened over the weekend for no specific reason.  She rates her pain at a 9/10 and describes her pain as sharp.  Pain located more in the lateral hip on the right.  Pain with activity.  No pain significantly radiating down legs.  Radiating pain: No LE numbness/tingling: No LE weakness: No Aggravating factors: All movement Treatments tried: Flexeril and Hydrocodone-acetaminophen  Diagnostic imaging: L-spine and sacral XR- 11/12/19 x-ray hip November 2020  Pertinent review of Systems: No fevers or chills  Relevant historical information: History of COPD.   Objective:    Vitals:   11/15/19 0829  BP: 110/78  Pulse: 87  SpO2: 96%   General: Well Developed, well nourished, and in no acute distress.   MSK: L-spine: Nontender to spinal midline. Normal lumbar motion. Right hip: Normal-appearing Tender palpation greater trochanter. Patient guards with hip range of motion but motion intact. Significant weakness to hip abduction 3/5. Significant antalgic gait. Left hip normal motion normal strength nontender.  Lab and Radiology Results  Hip greater trochanteric injection: Right Consent obtained and timeout performed. Area of maximum tenderness palpated and identified. Skin cleaned with alcohol, cold spray applied. A 22g needle was used to access the greater trochanteric bursa. 40 mg of kenalog and 3 mL of Marcaine were used to inject the trochanteric bursa. Patient tolerated the procedure well.   DG Lumbar Spine Complete  Result Date: 11/12/2019 CLINICAL DATA:  Lower back pain after fall 3 months ago. EXAM: LUMBAR SPINE - COMPLETE 4+ VIEW COMPARISON:  July 20, 2019. FINDINGS: There is no evidence of lumbar spine  fracture. Alignment is normal. Intervertebral disc spaces are maintained. IMPRESSION: Negative. Electronically Signed   By: Marijo Conception M.D.   On: 11/12/2019 16:52   DG Sacrum/Coccyx  Result Date: 11/12/2019 CLINICAL DATA:  Sacral pain after fall 3 months ago. EXAM: SACRUM AND COCCYX - 2+ VIEW COMPARISON:  December 15, 2015. FINDINGS: There is no evidence of fracture or other focal bone lesions. IMPRESSION: Negative. Electronically Signed   By: Marijo Conception M.D.   On: 11/12/2019 16:51   EXAM: DG HIP (WITH OR WITHOUT PELVIS) 3-4V BILAT  COMPARISON:  02/14/2016  FINDINGS: Osseous demineralization.  Hip and SI joint spaces preserved.  No fracture, dislocation, or bone destruction.  IMPRESSION: No acute abnormalities.   Electronically Signed   By: Lavonia Dana M.D.   On: 07/20/2019 14:55  I, Lori [name], personally (independently) visualized and performed the interpretation of the images attached in this note.    Impression and Recommendations:    Assessment and Plan: 64 y.o. female with right lateral hip pain occurring after fall in November 2020.  Patient continues to have significant pain and dysfunction.  Fortunately no evidence of fracture on x-rays obtained in November and again in February.  Discussed options today.  Patient declined physical therapy which I think is going to be essential to getting her better.  I offered her injection which she accepted.  Proceed with injection as above.  Also proceed with trial of home exercise program as taught in clinic today by ATC.  Check back in a few weeks.  If not better would proceed with  physical therapy.  MRI may also be an option soon if not better..   Discussed warning signs or symptoms. Please see discharge instructions. Patient expresses understanding.   The above documentation has been reviewed and is accurate and complete Lori [name]

## 2019-11-16 ENCOUNTER — Ambulatory Visit: Payer: Medicare HMO | Admitting: Allergy & Immunology

## 2019-11-16 ENCOUNTER — Ambulatory Visit: Payer: Medicare Other | Admitting: Allergy & Immunology

## 2019-11-17 ENCOUNTER — Encounter: Payer: Self-pay | Admitting: Gastroenterology

## 2019-11-24 ENCOUNTER — Ambulatory Visit (INDEPENDENT_AMBULATORY_CARE_PROVIDER_SITE_OTHER): Payer: Medicare Other

## 2019-11-24 ENCOUNTER — Telehealth: Payer: Self-pay | Admitting: *Deleted

## 2019-11-24 DIAGNOSIS — J309 Allergic rhinitis, unspecified: Secondary | ICD-10-CM | POA: Diagnosis not present

## 2019-11-24 NOTE — Telephone Encounter (Signed)
Is it a specific paperwork that we have in office or that I need to print off? I just want to ensure I have the correct paperwork for [facility] when she comes in next week. Thank You Tammy.

## 2019-11-24 NOTE — Telephone Encounter (Signed)
I spoke to patient and she advised she did not receive the paperwork I mailed but will fill out same next week  when she comes for allergy shots.  I advised her she will need to bring in copy of social security benefit info in for income verification when she comes in also

## 2019-11-24 NOTE — Telephone Encounter (Signed)
[employer] [employer] paperwork has been completed for the majority. Patient will need to complete page 4 and sign page 5, will need Dr. Ernst Bowler to sign page 3 and make a copy of her social security benefits that she is supposed to bring in with her. All information gathered needs to be faxed to [facility]. Paperwork is in the blue accordion folder in the injection room.

## 2019-11-24 NOTE — Telephone Encounter (Signed)
Thank You Tammy, will ensure we have this paperwork nearby for when Wanza comes in next week.

## 2019-11-24 NOTE — Telephone Encounter (Signed)
It is the consent I thin page 4 or 5 of the [employer] application for [place]. Should have same in office

## 2019-11-24 NOTE — Telephone Encounter (Signed)
Thank You Tammy, will ensure we have this paperwork nearby for when Larisha comes in next week.

## 2019-11-24 NOTE — Telephone Encounter (Signed)
Patient came in for her allergy injection and mentioned that her Lori [name] was $6,000. I noticed the previous telephone encounter mentioning having paperwork mailed to her home to see if she would qualify for free Fasenra. Patient stated that she would need assistance filling out the paperwork. Please advise on needed paperwork. Thank You.

## 2019-11-25 DIAGNOSIS — G43719 Chronic migraine without aura, intractable, without status migrainosus: Secondary | ICD-10-CM | POA: Diagnosis not present

## 2019-12-01 ENCOUNTER — Ambulatory Visit (INDEPENDENT_AMBULATORY_CARE_PROVIDER_SITE_OTHER): Payer: Medicare Other

## 2019-12-01 DIAGNOSIS — J309 Allergic rhinitis, unspecified: Secondary | ICD-10-CM

## 2019-12-08 ENCOUNTER — Ambulatory Visit (INDEPENDENT_AMBULATORY_CARE_PROVIDER_SITE_OTHER): Payer: Medicare Other

## 2019-12-08 DIAGNOSIS — J309 Allergic rhinitis, unspecified: Secondary | ICD-10-CM

## 2019-12-10 ENCOUNTER — Ambulatory Visit: Payer: Medicare Other | Admitting: Family Medicine

## 2019-12-21 ENCOUNTER — Other Ambulatory Visit: Payer: Self-pay | Admitting: Cardiology

## 2019-12-21 NOTE — Telephone Encounter (Signed)
Rx request sent to pharmacy.  

## 2019-12-22 ENCOUNTER — Other Ambulatory Visit: Payer: Self-pay

## 2019-12-22 ENCOUNTER — Telehealth: Payer: Self-pay | Admitting: Allergy & Immunology

## 2019-12-22 ENCOUNTER — Ambulatory Visit (INDEPENDENT_AMBULATORY_CARE_PROVIDER_SITE_OTHER): Payer: Medicare Other | Admitting: *Deleted

## 2019-12-22 ENCOUNTER — Ambulatory Visit (INDEPENDENT_AMBULATORY_CARE_PROVIDER_SITE_OTHER): Payer: Medicare Other | Admitting: Gastroenterology

## 2019-12-22 ENCOUNTER — Encounter: Payer: Self-pay | Admitting: Gastroenterology

## 2019-12-22 VITALS — BP 120/76 | HR 96 | Temp 98.2°F | Ht 64.0 in | Wt 125.6 lb

## 2019-12-22 DIAGNOSIS — J309 Allergic rhinitis, unspecified: Secondary | ICD-10-CM

## 2019-12-22 DIAGNOSIS — R790 Abnormal level of blood mineral: Secondary | ICD-10-CM | POA: Diagnosis not present

## 2019-12-22 DIAGNOSIS — K5909 Other constipation: Secondary | ICD-10-CM

## 2019-12-22 DIAGNOSIS — R1084 Generalized abdominal pain: Secondary | ICD-10-CM

## 2019-12-22 MED ORDER — EPINEPHRINE 0.3 MG/0.3ML IJ SOAJ: INTRAMUSCULAR | 0 refills | Status: DC

## 2019-12-22 NOTE — Telephone Encounter (Signed)
Patient brought in two letters from [facility] [employer]. One from 12/07/2019 stating she is approved and one from 12/13/2019 stating she is missing the prescription. Faxed to Tammy.

## 2019-12-22 NOTE — Patient Instructions (Signed)
If you are age 64 or older, your body mass index should be between 23-30. Your Body mass index is 21.56 kg/m. If this is out of the aforementioned range listed, please consider follow up with your Primary Care Provider.  If you are age 97 or younger, your body mass index should be between 19-25. Your Body mass index is 21.56 kg/m. If this is out of the aformentioned range listed, please consider follow up with your Primary Care Provider.   We have scheduled your Feraheme iron infusion x1 at [place] 15 min early for registration at short stay.  April 13th 2021 @ 12/noon   Constipation:  Drink one bottle of Magnesium Citrate over the counter for relief of constipation.  It was a pleasure to see you today!  Dr. Loletha Carrow

## 2019-12-22 NOTE — Telephone Encounter (Signed)
Refill for Epi-Pen has been sent to [facility].

## 2019-12-22 NOTE — Progress Notes (Signed)
[facility] Progress Note  Chief Complaint: Low ferritin  Subjective  History: Last seen November 2024 low ferritin with normal hemoglobin, no overt GI blood loss.  Clinical impression was poor dietary iron intake and/or iron malabsorption.  Last EGD September 2017 and colonoscopy December 2017.  Many years of chronic abdominal pain bloating and distention with multiple other somatic complaints and chronic pain syndrome. Has been on oral iron prescribed by primary care, has not had IV iron.  Lori [name] feels much the same as before, with generalized abdominal pain bloating and intermittent distention.  Lately Lori [name] constipation has been somewhat worse, with firm stool and the need to strain.  She is continue to take iron tablets and B12 injections.  Denies melena or bright red blood per rectum.  ROS: Cardiovascular: Had chest pain several days ago relieved with nitroglycerin Respiratory: no dyspnea at rest, sometimes with exertion Chronic pain syndrome Recent sadness over the loss of Lori [name] brother.  The patient's Past Medical, Family and Social History were reviewed and are on file in the EMR.  Objective:  Med list reviewed  Current Outpatient Medications:  .  ALPRAZolam (XANAX) 1 MG tablet, Take 1 tablet (1 mg total) by mouth at bedtime as needed for anxiety., Disp: 90 tablet, Rfl: 1 .  atorvastatin (LIPITOR) 20 MG tablet, Take 1 tablet (20 mg total) by mouth daily., Disp: 90 tablet, Rfl: 3 .  citalopram (CELEXA) 10 MG tablet, Take 1 tablet (10 mg total) by mouth daily., Disp: 90 tablet, Rfl: 3 .  cyclobenzaprine (FLEXERIL) 5 MG tablet, Take 1 tablet (5 mg total) by mouth 3 (three) times daily as needed. for muscle spams, Disp: 60 tablet, Rfl: 2 .  DEXILANT 60 MG capsule, TAKE 1 CAPSULE BY MOUTH  DAILY, Disp: 90 capsule, Rfl: 3 .  EPINEPHrine 0.3 mg/0.3 mL IJ SOAJ injection, INJECT AS DIRECTED FOR SEVERE ALLERGIC REACTION, Disp: 2 each, Rfl: 2 .  FASENRA 30 MG/ML SOSY, INJECT 30MG   SUBCUTANEOUSLY  EVERY 8 WEEKS (GIVEN AT  PRESCRIBERS OFFICE), Disp: 1 mL, Rfl: 8 .  gabapentin (NEURONTIN) 100 MG capsule, Take 1 capsule (100 mg total) by mouth 3 (three) times daily., Disp: 270 capsule, Rfl: 1 .  HYDROcodone-acetaminophen (NORCO/VICODIN) 5-325 MG tablet, Take 1 tablet by mouth every 6 (six) hours as needed for moderate pain., Disp: 20 tablet, Rfl: 0 .  iron polysaccharides (NU-IRON) 150 MG capsule, Take 1 capsule (150 mg total) by mouth daily., Disp: 90 capsule, Rfl: 1 .  levalbuterol (XOPENEX HFA) 45 MCG/ACT inhaler, Use 4 puffs every 4-6 hours as needed., Disp: 1 Inhaler, Rfl: 2 .  meclizine (ANTIVERT) 12.5 MG tablet, Take 1 tablet (12.5 mg total) by mouth 3 (three) times daily as needed for dizziness., Disp: 270 tablet, Rfl: 1 .  mometasone-formoterol (DULERA) 200-5 MCG/ACT AERO, Inhale 2 puffs into the lungs daily for 30 days., Disp: 39 g, Rfl: 1 .  mometasone-formoterol (DULERA) 200-5 MCG/ACT AERO, Use 2 puffs once daily with spacer., Disp: 13 g, Rfl: 5 .  montelukast (SINGULAIR) 10 MG tablet, Take 1 tablet (10 mg total) by mouth at bedtime., Disp: 90 tablet, Rfl: 3 .  nitroGLYCERIN (NITROSTAT) 0.4 MG SL tablet, DISSOLVE ONE TABLET UNDER THE TONGUE EVERY 5 MINUTES AS NEEDED FOR CHEST PAIN.  DO NOT EXCEED A TOTAL OF 3 DOSES IN 15 MINUTES, Disp: 25 tablet, Rfl: 0 .  ondansetron (ZOFRAN) 4 MG tablet, Take 1 tablet (4 mg total) by mouth every 8 (eight) hours as needed for  nausea or vomiting., Disp: 30 tablet, Rfl: 0 .  PROAIR HFA 108 (90 Base) MCG/ACT inhaler, Inhale 1-2 puffs into the lungs every 4 (four) hours as needed for shortness of breath or wheezing., Disp: 54 g, Rfl: 0 .  rosuvastatin (CRESTOR) 20 MG tablet, Take 1 tablet (20 mg total) by mouth daily., Disp: 90 tablet, Rfl: 3 .  valACYclovir (VALTREX) 1000 MG tablet, Take 1 tablet (1,000 mg total) by mouth 2 (two) times daily., Disp: 180 tablet, Rfl: 3 .  Vitamin D, Ergocalciferol, (DRISDOL) 1.25 MG (50000 UNIT) CAPS  capsule, Take 1 capsule (50,000 Units total) by mouth every 7 (seven) days., Disp: 12 capsule, Rfl: 0  Current Facility-Administered Medications:  .  Benralizumab SOSY 30 mg, 30 mg, Subcutaneous, Q8 Weeks, Valentina Shaggy, MD, 30 mg at 08/26/19 0901   Vital signs in last 24 hrs: Vitals:   12/22/19 0822  BP: 120/76  Pulse: 96  Temp: 98.2 F (36.8 C)    Physical Exam  Antalgic gait, pain complaints  HEENT: sclera anicteric, oral mucosa moist without lesions  Neck: supple, no thyromegaly, JVD or lymphadenopathy  Cardiac: RRR without murmurs, S1S2 heard, no peripheral edema  Pulm: clear to auscultation bilaterally, normal RR and effort noted  Abdomen: soft, generalized mild tenderness, with active bowel sounds. No guarding or palpable hepatosplenomegaly.  Skin; warm and dry, no jaundice or rash  Labs:  CBC Latest Ref Rng & Units 11/12/2019 07/20/2019 04/21/2019  WBC 4.0 - 10.5 K/uL 8.8 8.6 7.5  Hemoglobin 12.0 - 15.0 g/dL 12.8 12.7 13.0  Hematocrit 36.0 - 46.0 % 38.3 38.2 38.8  Platelets 150.0 - 400.0 K/uL 279.0 269.0 282.0   Iron/TIBC/Ferritin/ %Sat    Component Value Date/Time   IRON 43 11/12/2019 1132   FERRITIN 7.4 (L) 11/12/2019 1132   IRONPCTSAT 10.9 (L) 11/12/2019 1132   January of this year, B12 low normal at 215 ___________________________________________ Radiologic studies:   ____________________________________________ Other:   _____________________________________________ Assessment & Plan  Assessment: Encounter Diagnoses  Name Primary?  . Low ferritin Yes  . Generalized abdominal pain   . Chronic constipation    I still suspect Lori [name] low ferritin with normal hemoglobin and no overt GI blood loss is most likely poor iron absorption rather than chronic GI blood loss.  Many years of chronic generalized abdominal pain, bloating and distention, previous endoscopic work-up in 2017 and years prior to that as well for same symptoms.  Plan 1 dose  of IV iron with Feraheme 510 mg. We will request the primary care follow Lori [name] hemoglobin and iron levels afterwards and give Lori [name] IV iron as needed. Discontinue iron tablets  Magnesium citrate one half bottle followed by other half bottle several hours later for relief of constipation.  She reports "trying everything" for constipation over time.  Recommended reconsideration of MiraLAX.  20 minutes were spent on this encounter (including chart review, history/exam, counseling/coordination of care, and documentation)  Nelida Meuse III

## 2019-12-22 NOTE — Telephone Encounter (Signed)
Was coming to refill a paper script for nitro, looked into her chart and see that It was filled yesterday. Will shred the paper copy.

## 2019-12-22 NOTE — Telephone Encounter (Signed)
Patient needs a refill on her Epi Pen. Patient states hers is expired. Would like it sent to [facility] on [address].  Please advise.

## 2019-12-23 ENCOUNTER — Ambulatory Visit: Payer: Medicare Other | Admitting: Allergy & Immunology

## 2019-12-23 NOTE — Telephone Encounter (Signed)
L/m for patient advising approval and rx should be shipped next week sometime. She should start checking with [facility] next week for delivery and sch appt

## 2019-12-27 ENCOUNTER — Other Ambulatory Visit: Payer: Self-pay

## 2019-12-27 ENCOUNTER — Telehealth: Payer: Self-pay | Admitting: Gastroenterology

## 2019-12-27 DIAGNOSIS — R790 Abnormal level of blood mineral: Secondary | ICD-10-CM

## 2019-12-27 NOTE — Telephone Encounter (Signed)
Orders placed.

## 2019-12-27 NOTE — Telephone Encounter (Signed)
Sonia Baller, RN from [facility] stated that she does not see feraheme order in Epic.

## 2019-12-27 NOTE — Telephone Encounter (Signed)
No answer at [phone]

## 2019-12-28 ENCOUNTER — Other Ambulatory Visit: Payer: Self-pay

## 2019-12-28 ENCOUNTER — Ambulatory Visit (HOSPITAL_COMMUNITY)
Admission: RE | Admit: 2019-12-28 | Discharge: 2019-12-28 | Disposition: A | Discharge status: Home / Self Care | Payer: Medicare Other | Source: Ambulatory Visit | Attending: Gastroenterology | Admitting: Gastroenterology

## 2019-12-28 DIAGNOSIS — R790 Abnormal level of blood mineral: Secondary | ICD-10-CM | POA: Insufficient documentation

## 2019-12-28 MED ORDER — SODIUM CHLORIDE 0.9 % IV SOLN
510.0000 mg | Freq: Once | INTRAVENOUS | Status: AC
  Administered 2019-12-28: 510 mg via INTRAVENOUS
  Filled 2019-12-28: qty 510

## 2019-12-29 DIAGNOSIS — G43719 Chronic migraine without aura, intractable, without status migrainosus: Secondary | ICD-10-CM | POA: Diagnosis not present

## 2019-12-30 ENCOUNTER — Ambulatory Visit: Payer: Medicare Other | Admitting: Allergy & Immunology

## 2020-01-04 ENCOUNTER — Ambulatory Visit (INDEPENDENT_AMBULATORY_CARE_PROVIDER_SITE_OTHER): Payer: Medicare Other | Admitting: Allergy & Immunology

## 2020-01-04 ENCOUNTER — Encounter: Payer: Self-pay | Admitting: Allergy & Immunology

## 2020-01-04 ENCOUNTER — Other Ambulatory Visit: Payer: Self-pay

## 2020-01-04 VITALS — BP 110/70 | HR 87 | Temp 97.7°F | Resp 16 | Ht 62.0 in | Wt 129.6 lb

## 2020-01-04 DIAGNOSIS — J3089 Other allergic rhinitis: Secondary | ICD-10-CM | POA: Diagnosis not present

## 2020-01-04 DIAGNOSIS — J4550 Severe persistent asthma, uncomplicated: Secondary | ICD-10-CM | POA: Diagnosis not present

## 2020-01-04 DIAGNOSIS — J449 Chronic obstructive pulmonary disease, unspecified: Secondary | ICD-10-CM

## 2020-01-04 DIAGNOSIS — J302 Other seasonal allergic rhinitis: Secondary | ICD-10-CM | POA: Diagnosis not present

## 2020-01-04 IMAGING — DX DG CHEST 2V
2 series · 2 of 2 positions shown · non-contrast
Comparison: 08/15/2017, CT chest 02/16/2017

CLINICAL DATA: Cough

EXAM:
CHEST  2 VIEW

[chest pa]
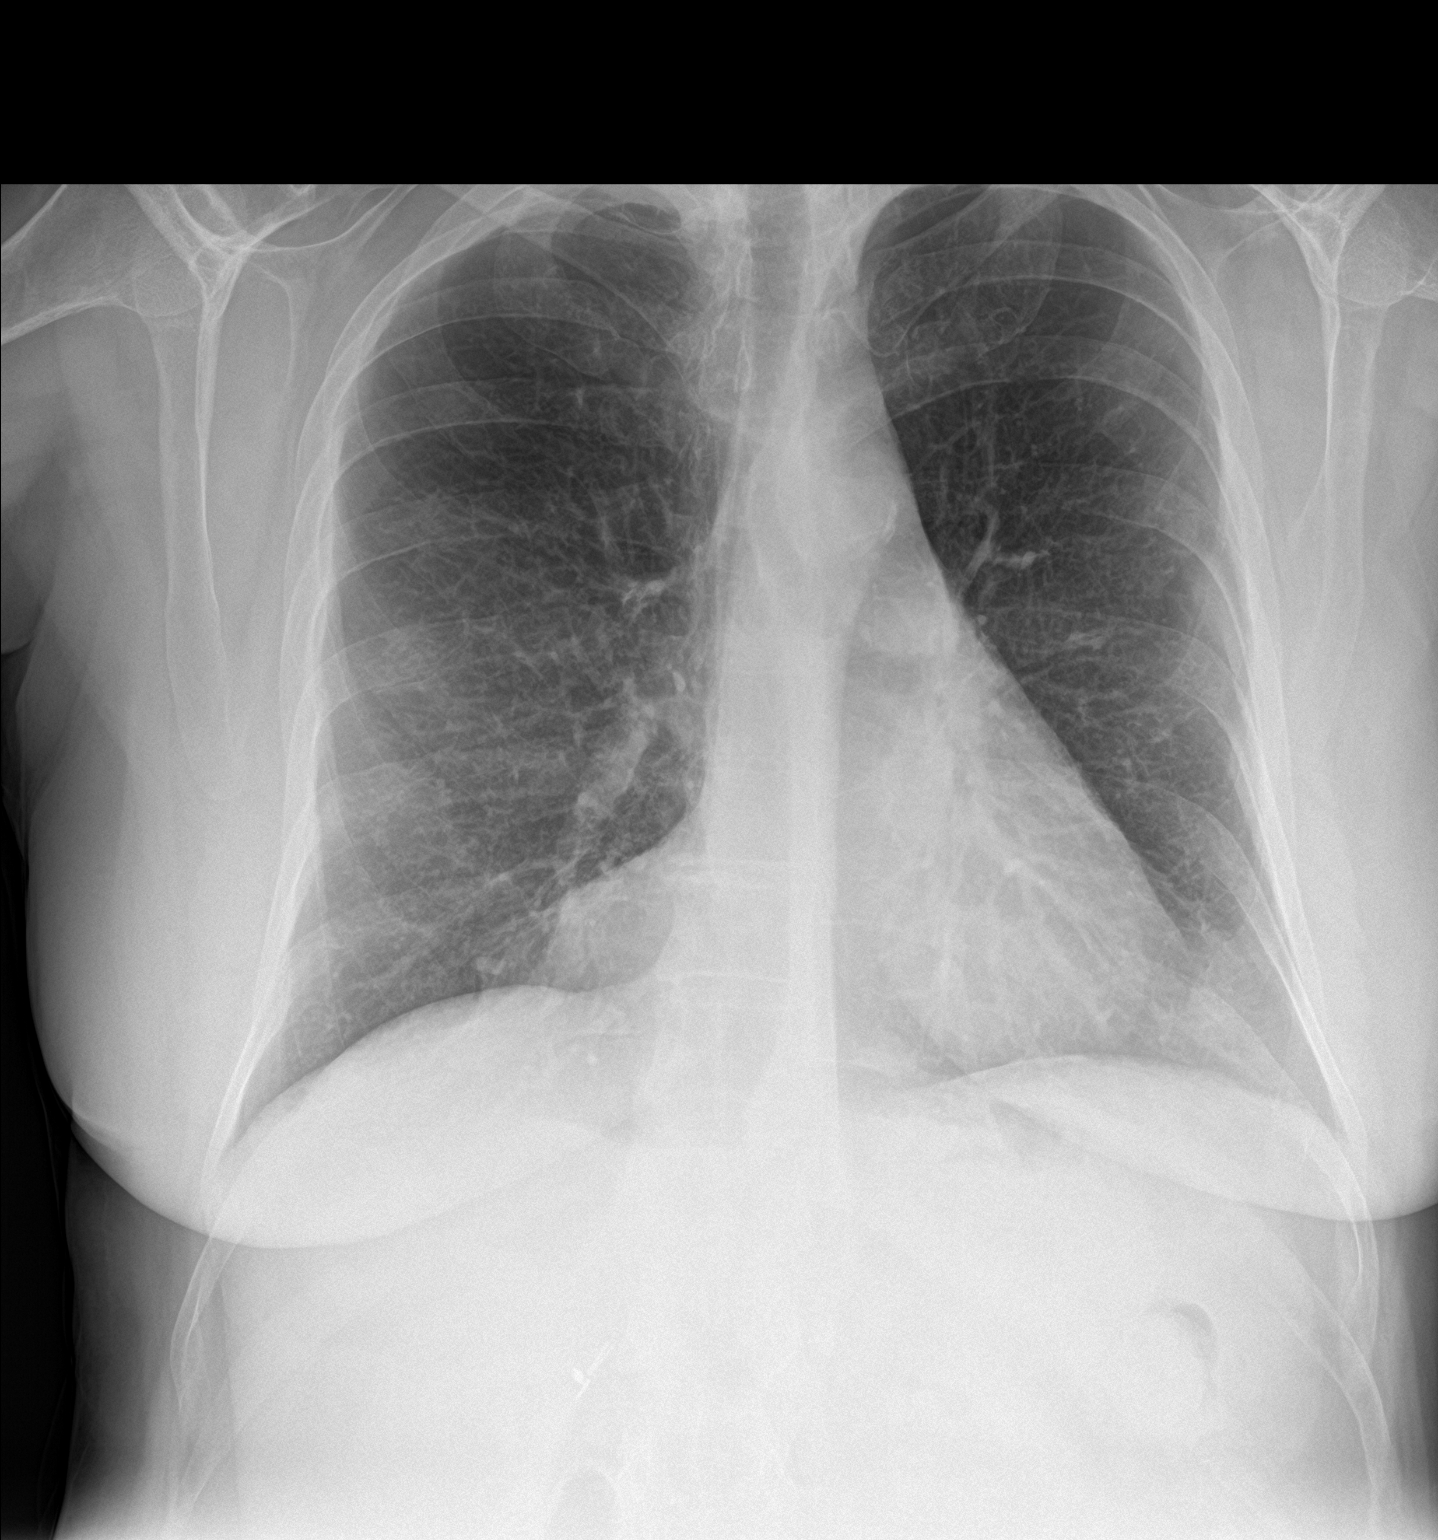

[chest lat]
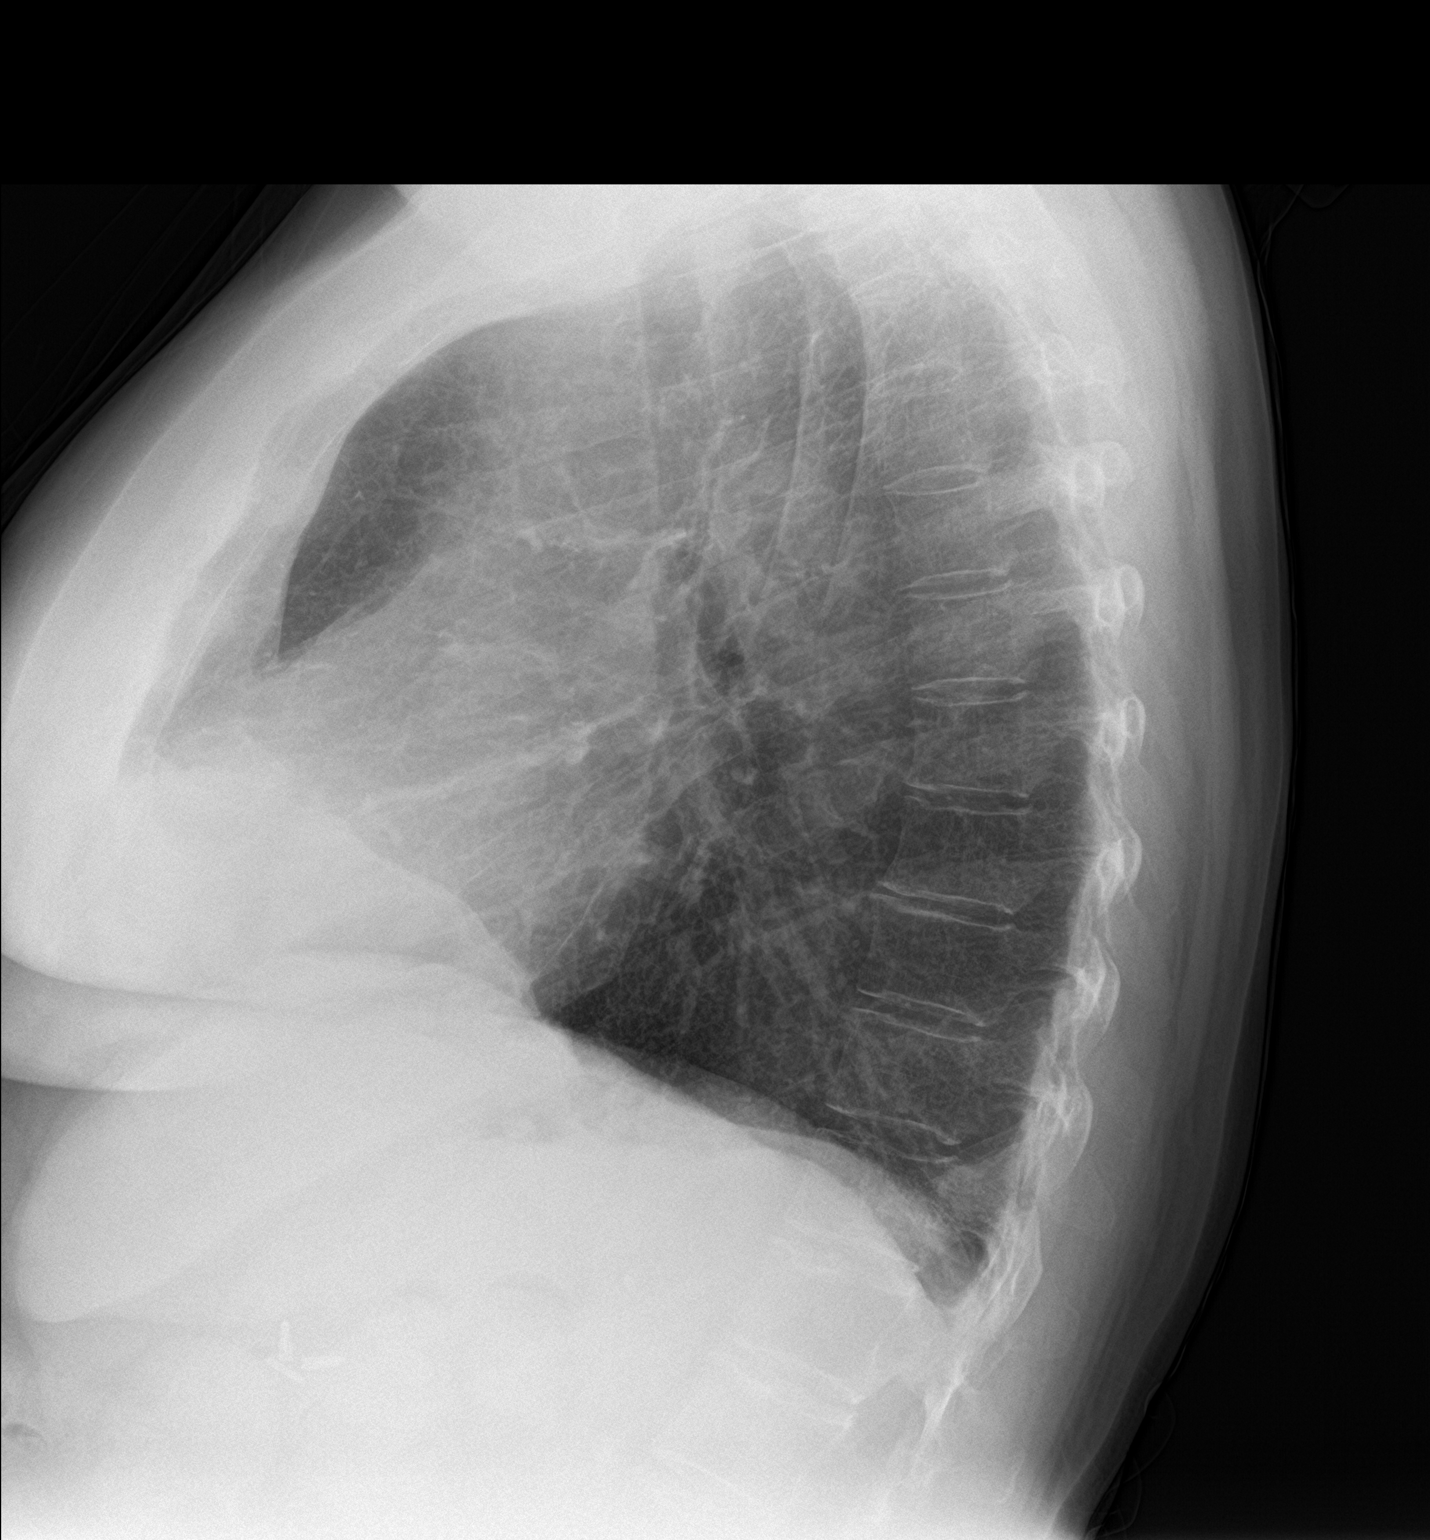

[2 of 2 positions shown; findings below may reference images not displayed]

FINDINGS: No focal pulmonary infiltrate or effusion. Stable cardiomediastinal
silhouette with prominent right pericardial fat. Aortic
atherosclerosis. No pneumothorax. Surgical hardware in the cervical
spine. Surgical clips in the right upper quadrant.
IMPRESSION: No active cardiopulmonary disease.

## 2020-01-04 MED ORDER — AMOXICILLIN 875 MG PO TABS: 875.0000 mg | ORAL_TABLET | Freq: Two times a day (BID) | ORAL | 0 refills | Status: AC

## 2020-01-04 MED ORDER — MONTELUKAST SODIUM 10 MG PO TABS: 10.0000 mg | ORAL_TABLET | Freq: Every day | ORAL | 3 refills | Status: DC

## 2020-01-04 NOTE — Patient Instructions (Addendum)
1. Moderate persistent asthma, uncomplicated - Lung testing looked stable today. - Fasenra dose given today.  - Daily controller medication(s): Singulair (montelukast) 10mg  daily + Dulera 200/5 mcg two puffs once daily with spacer + Fasenra every 8 weeks.   - Rescue medications: Xopenex 4 puffs every 4-6 hours as needed or Xopenex nebulizer one vial puffs every 4-6 hours as needed - Asthma control goals:  * Full participation in all desired activities (may need albuterol before activity) * Albuterol use two time or less a week on average (not counting use with activity) * Cough interfering with sleep two time or less a month * Oral steroids no more than once a year * No hospitalizations   2. Perennial allergic rhinitis  - Continue with shots every two weeks (come back later this week for your allergy shot).  - Continue with Singulair 10mg  daily. - Continue with cetirizine (Zyrtec) 10mg  daily to help with the sneezing and other allergy symptoms.   3.  Acute bronchitis - Start amoxicillin 875mg  twice daily for one week.  - Start Mucinex twice daily.  - Call us with an update at the end of the week.   4. Return in about 6 months (around 07/05/2020).   Please inform us of any Emergency Department visits, hospitalizations, or changes in symptoms. Call us before going to the ED for breathing or allergy symptoms since we might be able to fit you in for a sick visit. Feel free to contact us anytime with any questions, problems, or concerns.  It was a pleasure to see you again today!   Websites that have reliable patient information: 1. American Academy of Asthma, Allergy, and Immunology: www.aaaai.org 2. Food Allergy Research and Education (FARE): foodallergy.org 3. Mothers of Asthmatics: http://www.asthmacommunitynetwork.org 4. American College of Allergy, Asthma, and Immunology: www.acaai.org

## 2020-01-04 NOTE — Progress Notes (Signed)
FOLLOW UP  Date of Service/Encounter:  01/04/20   Assessment:   Seasonal and perennial allergic rhinitis(mold, cat, dog)- on allergen immunotherapyevery 2 weeks with good results  Cigarette smoker  Moderate persistent asthmawith COPD overlap- seems stabilized on Fasenra  Poor compliance  Complex medical history  Recent death of spouse 2019/07/02) and brother (March 2021)  Plan/Recommendations:   1. Moderate persistent asthma, uncomplicated - Lung testing looked stable today. - Fasenra dose given today.  - Daily controller medication(s): Singulair (montelukast) 10mg  daily + Dulera 200/5 mcg two puffs once daily with spacer + Fasenra every 8 weeks.   - Rescue medications: Xopenex 4 puffs every 4-6 hours as needed or Xopenex nebulizer one vial puffs every 4-6 hours as needed - Asthma control goals:  * Full participation in all desired activities (may need albuterol before activity) * Albuterol use two time or less a week on average (not counting use with activity) * Cough interfering with sleep two time or less a month * Oral steroids no more than once a year * No hospitalizations   2. Perennial allergic rhinitis  - Continue with shots every two weeks (come back later this week for your allergy shot).  - Continue with Singulair 10mg  daily. - Continue with cetirizine (Zyrtec) 10mg  daily to help with the sneezing and other allergy symptoms.   3.  Acute bronchitis - Start amoxicillin 875mg  twice daily for one week.  - Start Mucinex twice daily.  - Call us with an update at the end of the week.   4. Return in about 6 months (around 07/05/2020).  Subjective:   Lori [name] is a 64 y.o. female presenting today for follow up of  Chief Complaint  Patient presents with  . Follow-up  . Asthma    ZAIDEN SIMAN has a history of the following: Patient Active Problem List   Diagnosis Date Noted  . Genital herpes 11/12/2019  . Coccyx pain 11/12/2019    . Insomnia 11/12/2019  . Acute right-sided low back pain without sciatica 07/20/2019  . Grief 07/20/2019  . Vitamin D deficiency 07/20/2019  . Hyperglycemia 04/21/2019  . Left knee pain 04/21/2019  . Pain 12/29/2018  . B12 deficiency 10/20/2018  . Nausea 04/27/2018  . Anemia, iron deficiency 04/27/2018  . Intermittent paresthesia of hand and foot 04/27/2018  . Headache 03/21/2018  . Lumbar radiculitis 08/15/2017  . Degenerative disc disease, lumbar 07/21/2017  . Encounter for well adult exam with abnormal findings 06/30/2017  . Epigastric pain 06/30/2017  . Restrictive lung disease 06/30/2017  . Leg cramping 07-01-2017  . Elbow contusion 2017/07/01  . Right elbow pain 05/27/2017  . Asthma-COPD overlap syndrome ([place]) 01/31/2017  . Dizziness 01/20/2017  . Seasonal and perennial allergic rhinitis 01/01/2017  . Moderate persistent asthma, uncomplicated 99991111  . Pulmonary emphysema ([place]) 12/03/2016  . Tobacco use disorder 12/03/2016  . Chronic seasonal allergic rhinitis 12/03/2016  . Medication management 11/29/2016  . Cough 11/23/2016  . Rash 11/23/2016  . Vertigo 10/22/2016  . Gastroesophageal reflux disease with esophagitis 10/22/2016  . Non compliance w medication regimen 08/01/2016  . Allergic contact dermatitis due to metals 08/01/2016  . Migraine without aura and without status migrainosus, not intractable 07/22/2016  . Spasm of muscle of lower back 06/21/2016  . Chronic rhinitis 06/19/2016  . Gait difficulty 06/21/2015  . Fibromyalgia 06/21/2015  . Conversion reaction 06/21/2015  . Depression 06/21/2015  . Syncope and collapse 06/21/2015  . Plica syndrome of left knee 01/06/2015  .  Anxiety state 09/06/2014  . History of MI 09/06/2014  . Cigarette smoker 06/27/2013  . Borderline hypertension 06/27/2013  . Chronic low back pain with sciatica 06/14/2013  . Leg pain, bilateral 06/14/2013  . Gastroparesis 10/26/2009  . Musculoskeletal chest pain 05/02/2009  .  Hyperlipidemia LDL goal <100 11/18/2008  . COPD exacerbation ([place]) 11/18/2008  . HEPATIC CYST 11/18/2008    History obtained from: chart review and patient.  Lori [name] is a 64 y.o. female presenting for a follow up visit.  She was last seen in October 2020. At that time, her lung testing looks stable.  We continued her on Singulair 10 mg daily as well as Dulera 2 puffs once daily and Fasenra every 8 weeks.  For her rhinitis, we continue with her allergy shots every 2 weeks as well as Singulair and Zyrtec.  We did start her on amoxicillin and Mucinex for acute bronchitis.  Since last visit, she has mostly done well. However her brother recently passed away in 12/10/2022, although she was not very close to him at all.  Asthma/Respiratory Symptom History: She remains on the [facility] two puffs twice daily. She is set to receive her Berna Bue today. I did check and it was in the shot room. ACT is 7 today, indicating poor asthma control. She has been using her rescue nebulizer much more than previous visits. She has ont been to the ED at all. She remains on the Singulair daily.  She is now down to half a pack per day.  Allergic Rhinitis Symptom History: She is not using her nose spray at all. She is on the montelukast as well as the cetirizine. She is on her allergy shots at every two weeks. She is requesting some antibiotics for postnasal drip. She is not taking Mucinex at all.   She has not needed antibiotics at all since last visit.  She was diagnosed with iron deficinecy anemia and had an iron infusion. She is unsure how long she is going to have to get those. She is followed by Dr. Loletha Carrow in Gastroenterology.  She has had no overt GI blood loss, so Dr. Loletha Carrow felt that this was likely related to poor iron absorption rather than chronic GI blood loss.  Her last endoscopy was in 2015-12-10.  Otherwise, there have been no changes to her past medical history, surgical history, family history, or social history.    Review  of Systems  Constitutional: Negative.  Negative for chills, fever, malaise/fatigue and weight loss.  HENT: Negative.  Negative for congestion, ear discharge, ear pain and sore throat.   Eyes: Negative for pain, discharge and redness.  Respiratory: Positive for cough and shortness of breath. Negative for sputum production and wheezing.   Cardiovascular: Negative.  Negative for chest pain and palpitations.  Gastrointestinal: Positive for constipation. Negative for abdominal pain, diarrhea, heartburn, nausea and vomiting.  Skin: Negative.  Negative for itching and rash.  Neurological: Negative for dizziness and headaches.  Endo/Heme/Allergies: Negative for environmental allergies. Does not bruise/bleed easily.       Objective:   Blood pressure 110/70, pulse 87, temperature 97.7 F (36.5 C), temperature source Temporal, resp. rate 16, height 5\' 2"  (1.575 m), weight 129 lb 9.6 oz (58.8 kg), SpO2 98 %. Body mass index is 23.7 kg/m.   Physical Exam:  Physical Exam  Constitutional: She appears well-developed.  Cranky and ornery as always.  Less tearful today and more joking.  HENT:  Head: Normocephalic and atraumatic.  Right Ear: Tympanic membrane, external  ear and ear canal normal.  Left Ear: Tympanic membrane, external ear and ear canal normal.  Nose: Mucosal edema and rhinorrhea present. No nasal deformity or septal deviation. No epistaxis. Right sinus exhibits no maxillary sinus tenderness and no frontal sinus tenderness. Left sinus exhibits no maxillary sinus tenderness and no frontal sinus tenderness.  Mouth/Throat: Uvula is midline and oropharynx is clear and moist. Mucous membranes are not pale and not dry.  Eyes: Pupils are equal, round, and reactive to light. Conjunctivae and EOM are normal. Right eye exhibits no chemosis and no discharge. Left eye exhibits no chemosis and no discharge. Right conjunctiva is not injected. Left conjunctiva is not injected.  Cardiovascular: Normal  rate, regular rhythm and normal heart sounds.  Respiratory: Effort normal and breath sounds normal. No accessory muscle usage. No tachypnea. No respiratory distress. She has no wheezes. She has no rhonchi. She has no rales. She exhibits no tenderness.  Somewhat decreased air movement at the bases.  Seems more barrel chested today than previous visits.  Lymphadenopathy:    She has no cervical adenopathy.  Neurological: She is alert.  Skin: No abrasion, no petechiae and no rash noted. Rash is not papular, not vesicular and not urticarial. No erythema. No pallor.  Psychiatric: She has a normal mood and affect.     Diagnostic studies:    Spirometry: results abnormal (FEV1: 1.35/59%, FVC: 1.96/66%, FEV1/FVC: 69%).    Spirometry consistent with possible restrictive disease.   Allergy Studies: none       Salvatore Marvel, MD  [facility]

## 2020-01-04 NOTE — Progress Notes (Addendum)
Immunotherapy   Patient Details  Name: FONTELLA DELINE MRN: [id number] Date of Birth: 1955/11/13  01/04/2020  [name] started injections for  Berna Bue  Frequency:Every 4 Weeks  Epi-Pen: Yes Consent signed and patient instructions given. Patient restarted injections for Fasenra. She received the Fasenra injection in her right arm and waited 15 minutes with Dr. Gillermina Hu permission. Patient did not have any issues.    Kresha Abelson Fernandez-Vernon 01/04/2020, 2:43 PM

## 2020-01-06 ENCOUNTER — Ambulatory Visit (INDEPENDENT_AMBULATORY_CARE_PROVIDER_SITE_OTHER): Payer: Medicare Other | Admitting: *Deleted

## 2020-01-06 ENCOUNTER — Ambulatory Visit (INDEPENDENT_AMBULATORY_CARE_PROVIDER_SITE_OTHER): Payer: Medicare Other

## 2020-01-06 ENCOUNTER — Other Ambulatory Visit: Payer: Self-pay

## 2020-01-06 DIAGNOSIS — E538 Deficiency of other specified B group vitamins: Secondary | ICD-10-CM

## 2020-01-06 DIAGNOSIS — J309 Allergic rhinitis, unspecified: Secondary | ICD-10-CM

## 2020-01-06 MED ORDER — CYANOCOBALAMIN 1000 MCG/ML IJ SOLN
1000.0000 ug | Freq: Once | INTRAMUSCULAR | Status: AC
  Administered 2020-01-06: 1000 ug via INTRAMUSCULAR

## 2020-01-06 NOTE — Progress Notes (Signed)
Pls cosign for B12 inj../lmb  

## 2020-01-08 IMAGING — CT CT ABD-PELV W/ CM
2 of 5 series · 15 of 46 positions shown, 17 images · IV contrast (ISOVUE 300)
Comparison: April 22, 2015

CLINICAL DATA: Chronic abdominal distention.

EXAM:
CT ABDOMEN AND PELVIS WITH CONTRAST
TECHNIQUE: Multidetector CT imaging of the abdomen and pelvis was performed
using the standard protocol following bolus administration of
intravenous contrast. Oral contrast was also administered.
CONTRAST:  100 mL Psovue-L11 nonionic

[Series 2: axial st · axial · 0.68mm/px · z∈[-428,-68]mm · 12 of 86 slices shown, 14 images]
[im 7/86  soft-tissue]
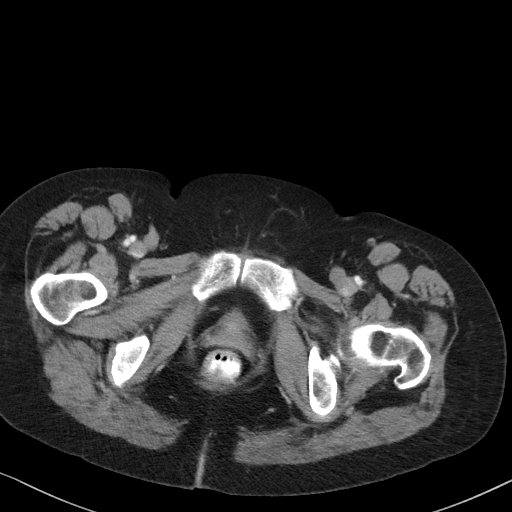
[im 7/86  bone]
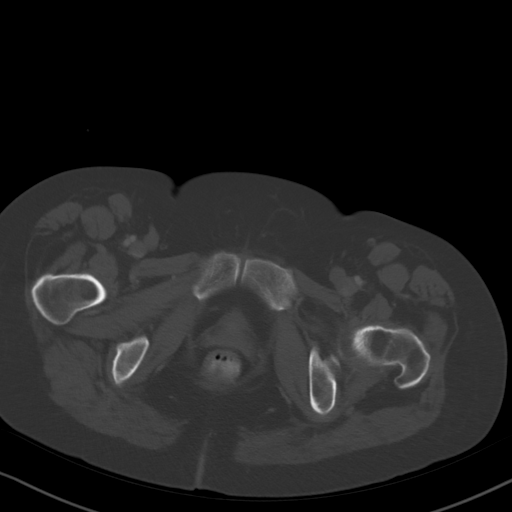
[im 13/86  soft-tissue]
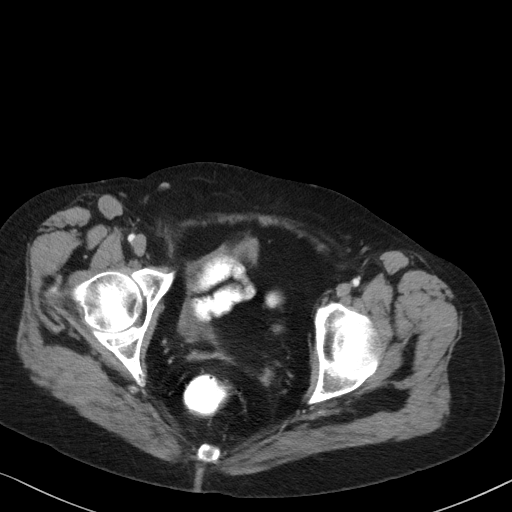
[im 19/86  soft-tissue]
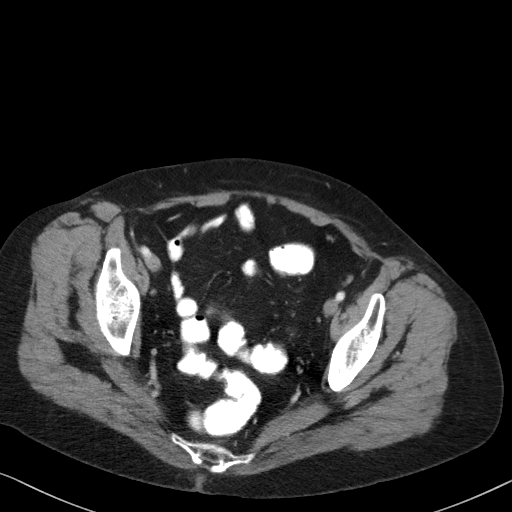
[im 25/86  soft-tissue]
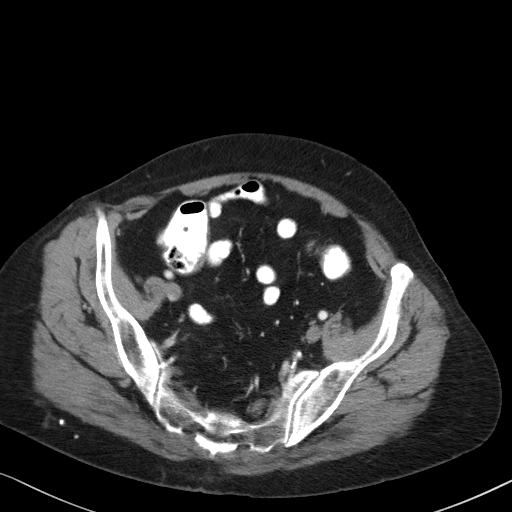
[im 31/86  soft-tissue]
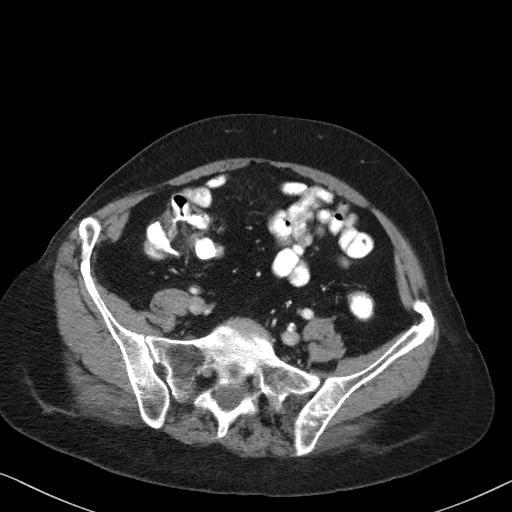
[im 37/86  soft-tissue]
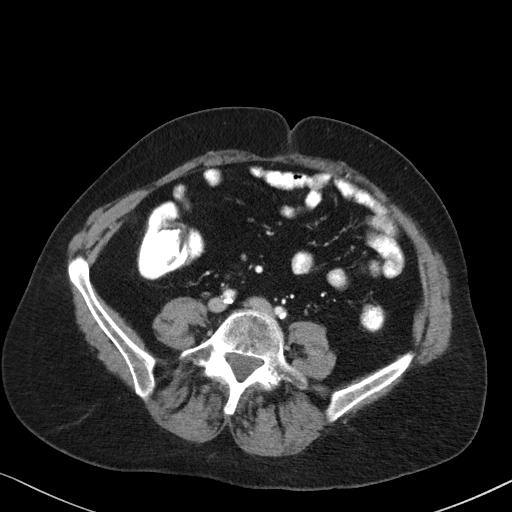
[im 49/86  soft-tissue]
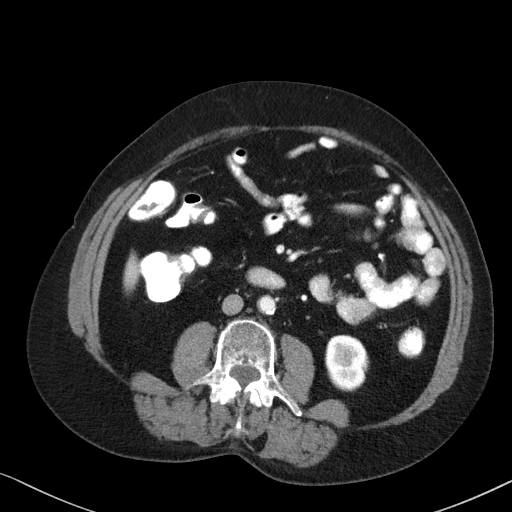
[im 55/86  soft-tissue]
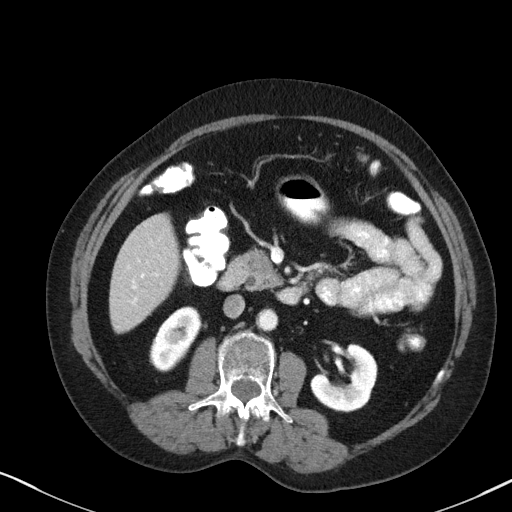
[im 61/86  soft-tissue]
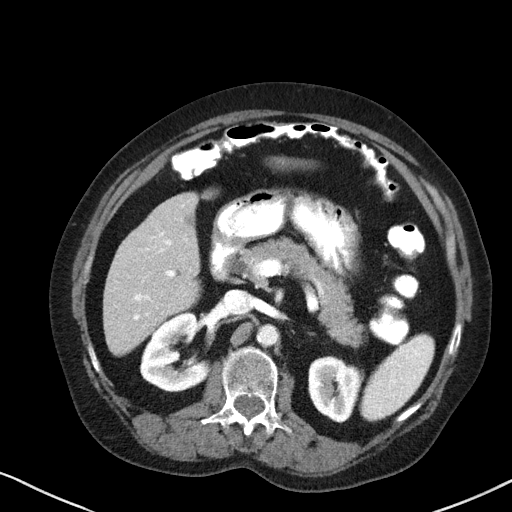
[im 61/86  bone]
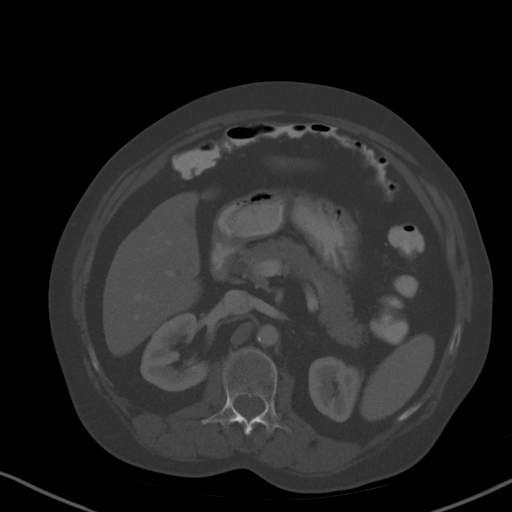
[im 67/86  soft-tissue]
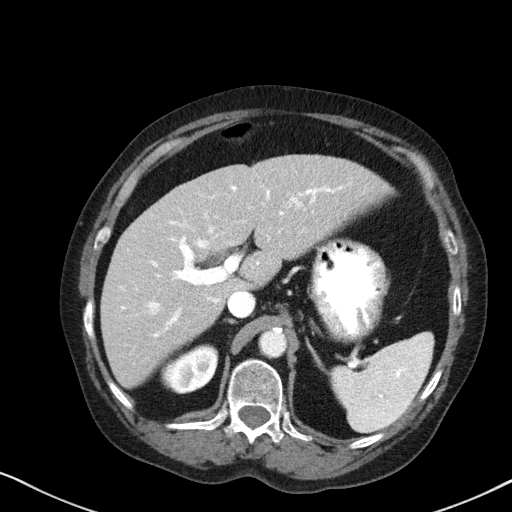
[im 73/86  soft-tissue]
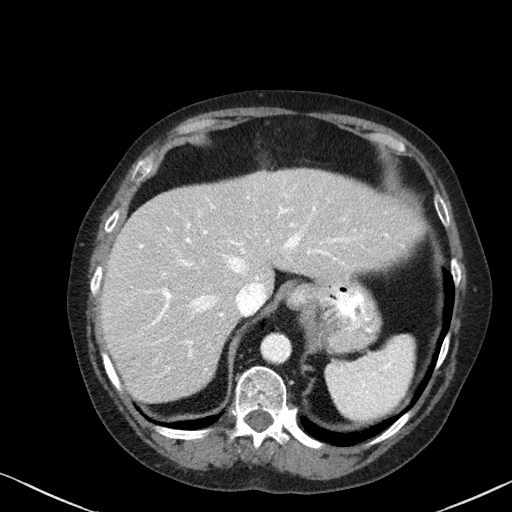
[im 79/86  soft-tissue]
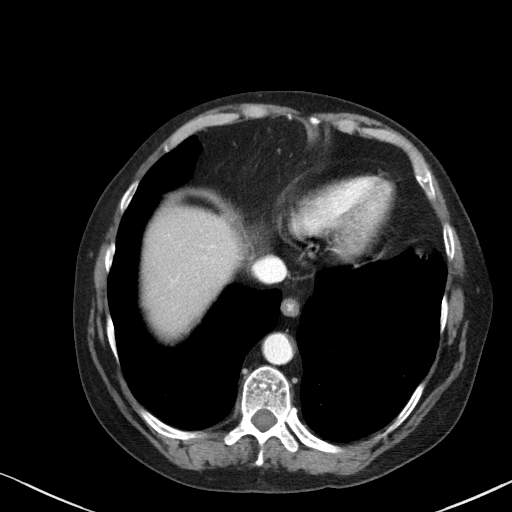

[Series 4: coronal st · coronal · 0.67mm/px · 3 of 100 slices shown]
[im 34/100  soft-tissue]
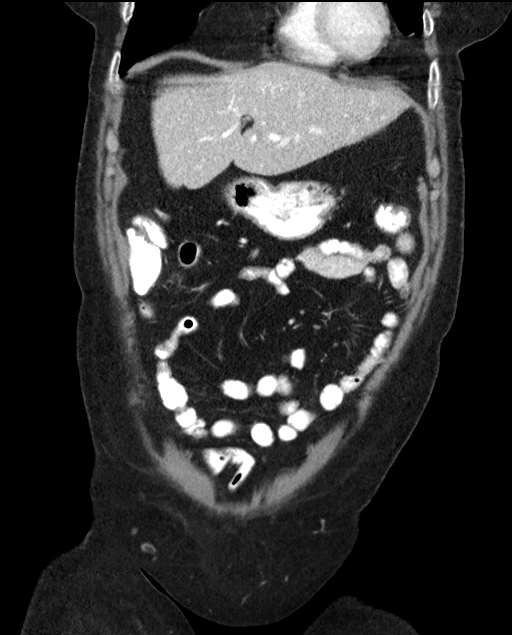
[im 45/100  soft-tissue]
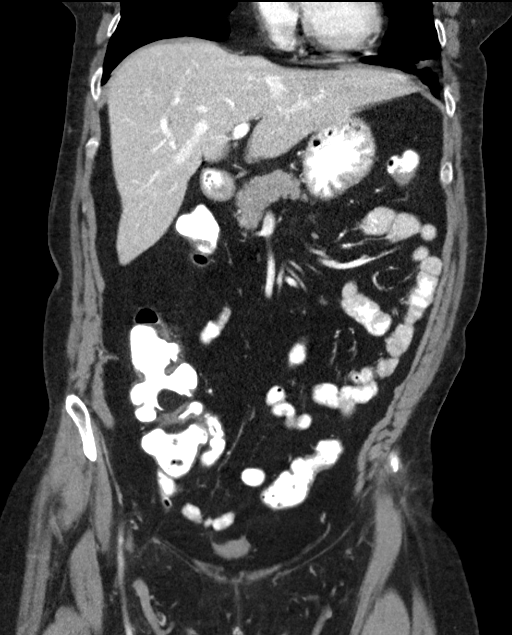
[im 56/100  soft-tissue]
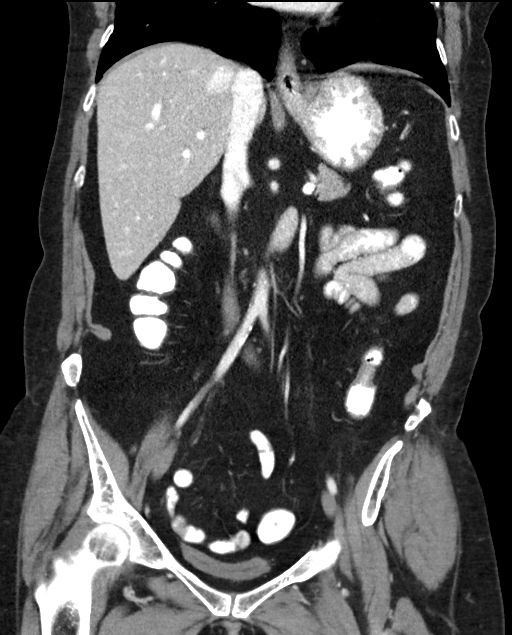

[15 of 46 positions shown; findings below may reference images not displayed]

FINDINGS: Lower chest: Lungs are clear. Note that there is fairly extensive
inferior mediastinal fat to the right of the heart.

Hepatobiliary: There is hepatic steatosis. No focal liver lesions
are evident. Gallbladder is absent. The common bile duct measures 8
mm with tapering distally. No biliary duct mass or calculus evident.

Pancreas: No pancreatic mass or inflammatory focus is evident.

Spleen: No splenic lesions are apparent.

Adrenals/Urinary Tract: Adrenals bilaterally appear normal. Kidneys
bilaterally show no evident mass or hydronephrosis on either side.
There is no renal or ureteral calculus on either side. Urinary
bladder is midline with wall thickness slightly increased.

Stomach/Bowel: There is no appreciable bowel wall or mesenteric
thickening. No bowel obstruction evident. No free air or portal
venous air. There is lipomatous infiltration of the ileocecal valve.

Vascular/Lymphatic: There is atherosclerotic calcification and
plaque in the aorta and common iliac arteries. No aneurysm evident.
Major mesenteric vessels appear patent. No adenopathy is evident in
the abdomen or pelvis.

Reproductive: Uterus is absent.  No pelvic mass.

Other: Appendix absent. No ascites or abscess evident in the abdomen
or pelvis.

Musculoskeletal: There is degenerative change in the lumbar spine.
There are no blastic or lytic bone lesions. There is no
intramuscular or abdominal wall lesion. There is postoperative
change along the left iliac crest, stable.
IMPRESSION: 1. No evident bowel wall thickening or bowel obstruction. There is
lipomatous infiltration of the ileocecal valve. Appendix absent. No
abscess.

2. There is thickening of the urinary bladder wall, concerning for a
degree of cystitis.

3.  No renal or ureteral calculus.  No hydronephrosis.

4. Hepatic steatosis. No focal liver lesions evident. Gallbladder
absent. Mild prominence of the common bile duct is consistent with
post cholecystectomy state. No biliary duct mass or calculus
evident.

5.  Aortoiliac atherosclerosis.  No aneurysm evident.

6.  Uterus absent.

Aortic Atherosclerosis ([id number]-9DJ.J).

## 2020-01-13 ENCOUNTER — Ambulatory Visit (INDEPENDENT_AMBULATORY_CARE_PROVIDER_SITE_OTHER): Payer: Medicare Other

## 2020-01-13 DIAGNOSIS — J309 Allergic rhinitis, unspecified: Secondary | ICD-10-CM | POA: Diagnosis not present

## 2020-01-17 ENCOUNTER — Ambulatory Visit (INDEPENDENT_AMBULATORY_CARE_PROVIDER_SITE_OTHER): Payer: Medicare Other | Admitting: Family Medicine

## 2020-01-17 ENCOUNTER — Other Ambulatory Visit: Payer: Self-pay

## 2020-01-17 ENCOUNTER — Ambulatory Visit (INDEPENDENT_AMBULATORY_CARE_PROVIDER_SITE_OTHER): Payer: Medicare Other

## 2020-01-17 VITALS — BP 110/64 | HR 75 | Ht 62.0 in | Wt 128.0 lb

## 2020-01-17 DIAGNOSIS — M25521 Pain in right elbow: Secondary | ICD-10-CM

## 2020-01-17 DIAGNOSIS — S59901A Unspecified injury of right elbow, initial encounter: Secondary | ICD-10-CM | POA: Diagnosis not present

## 2020-01-17 DIAGNOSIS — M25531 Pain in right wrist: Secondary | ICD-10-CM

## 2020-01-17 DIAGNOSIS — S6991XA Unspecified injury of right wrist, hand and finger(s), initial encounter: Secondary | ICD-10-CM | POA: Diagnosis not present

## 2020-01-17 MED ORDER — PREGABALIN 75 MG PO CAPS: 75.0000 mg | ORAL_CAPSULE | Freq: Two times a day (BID) | ORAL | 1 refills | Status: DC | PRN

## 2020-01-17 MED ORDER — PREDNISONE 10 MG PO TABS: 30.0000 mg | ORAL_TABLET | Freq: Every day | ORAL | 0 refills | Status: DC

## 2020-01-17 MED ORDER — TRAMADOL HCL 50 MG PO TABS: 50.0000 mg | ORAL_TABLET | Freq: Three times a day (TID) | ORAL | 0 refills | Status: DC | PRN

## 2020-01-17 NOTE — Progress Notes (Signed)
X-ray wrist just so some arthritis at the base of the thumb otherwise it looks pretty normal

## 2020-01-17 NOTE — Progress Notes (Signed)
I, Lori [name], LAT, ATC, am serving as scribe for [name].  Lori [name] is a 64 y.o. female who presents to [facility] today for R hand pain.  She was last seen by [name] on 11/15/19 for R hip pain.  Since her last visit, pt reports that she fell prior to the last visit. Pain in wrist, palmar aspect and CMC joint. Has not had another injury since last visit. Pain has slowly increased and is today a 10/10. Cannot grip items due to pain.  She notes tingling and pain to all 5 digits of her hand.  She cannot tell if it is worse on the radial side or ulnar side.  She also has some elbow pain but denies significant shoulder pain.  Pain is worse with motion.  She notes that she thinks the pain is related to her fall.  However she really did not have any arm pain until about 3 days ago.  She cannot think of any change in activity or injury to cause her pain to worsen a few days ago.    Pertinent review of systems: No fevers or chills  Relevant historical information: Emphysema and restrictive lung disease and asthma   Exam:  BP 110/64   Pulse 75   Ht 5\' 2"  (1.575 m)   Wt 128 lb (58.1 kg)   SpO2 99%   BMI 23.41 kg/m  General: Well Developed, well nourished, and in no acute distress.   MSK: Right shoulder normal-appearing nontender decreased abduction range of motion otherwise normal. Right elbow normal-appearing not particularly tender to palpation. Decreased elbow motion. Right forearm normal-appearing without swelling. Right hand and wrist particularly swelling.  No erythema. Diffusely tender.  Patient guards with hand and wrist motion. Patient cannot generate enough force with grip or extension strength resisted testing without causing pain.  Exam limited by patient discomfort. Pulses and capillary refill and sensation are intact distally.  C-spine nontender normal cervical motion.   Lab and Radiology Results  X-ray images right elbow  and right wrist obtained today personally and independently reviewed.  Right elbow: Small effusion present.  Spur at olecranon at insertion of triceps present.  No visible fracture present  Right wrist: Mild degenerative changes.  Old rounded avulsion at ulnar styloid present.  No acute fractures visible.  Await formal radiology review    Assessment and Plan: 64 y.o. female with right arm pain.  Etiology is somewhat unclear.  Fundamentally patient has quite a bit of pain and is guarding her arm making physical exam difficult.  Fortunately she does have intact pulses cap refill and sensation distally so severe ischemia or other limb threatening issues are much less likely.  X-rays today did not show obvious cause of pain however radiology overread is still pending.  Plan for wrist brace which did provide some comfort and short course of oral prednisone.  Additionally prescribed tramadol for pain and Lyrica for possible nerve pain component.  She notes that gabapentin has not been helpful in the past and she gets nauseated with hydrocodone but think she can take tramadol. Recheck back in 1 to 2 weeks.  Return sooner if needed.   PDMP reviewed during this encounter. Orders Placed This Encounter  Procedures  . DG Wrist Complete Right    Standing Status:   Future    Number of Occurrences:   1    Standing Expiration Date:   03/18/2021    Order Specific Question:  Reason for Exam (SYMPTOM  OR DIAGNOSIS REQUIRED)    Answer:   eval elbow and wrist pain    Order Specific Question:   Preferred imaging location?    Answer:   [name]    Order Specific Question:   Radiology Contrast Protocol - do NOT remove file path    Answer:   \\[email or web address].pdf  . DG ELBOW COMPLETE RIGHT (3+VIEW)    Standing Status:   Future    Number of Occurrences:   1    Standing Expiration Date:   03/18/2021    Order Specific Question:   Reason for Exam (SYMPTOM  OR  DIAGNOSIS REQUIRED)    Answer:   eval wrist and elbow pain    Order Specific Question:   Preferred imaging location?    Answer:   [name]    Order Specific Question:   Radiology Contrast Protocol - do NOT remove file path    Answer:   \\[email or web address]   Meds ordered this encounter  Medications  . predniSONE (DELTASONE) 10 MG tablet    Sig: Take 3 tablets (30 mg total) by mouth daily with breakfast.    Dispense:  15 tablet    Refill:  0  . traMADol (ULTRAM) 50 MG tablet    Sig: Take 1 tablet (50 mg total) by mouth every 8 (eight) hours as needed for severe pain.    Dispense:  10 tablet    Refill:  0  . pregabalin (LYRICA) 75 MG capsule    Sig: Take 1 capsule (75 mg total) by mouth 2 (two) times daily as needed (nerve pain/stinging).    Dispense:  60 capsule    Refill:  1     Discussed warning signs or symptoms. Please see discharge instructions. Patient expresses understanding.   The above documentation has been reviewed and is accurate and complete Lynne Leader

## 2020-01-17 NOTE — Patient Instructions (Signed)
Thank you for coming in today. Use the wrist brace as needed.  Get xray today.  Take prednisone 3 pills daily for 5 days.  Use tramadol for severe pain Use lyrica up to 2x daily for nerve pain.  Use goodrx if the lyrica is not covered.  Recheck in 1-2 weeks.  Keep me updated.

## 2020-01-17 NOTE — Progress Notes (Signed)
Elbow x-ray looks pretty normal to radiology

## 2020-01-21 ENCOUNTER — Ambulatory Visit (INDEPENDENT_AMBULATORY_CARE_PROVIDER_SITE_OTHER): Payer: Medicare Other

## 2020-01-21 DIAGNOSIS — J309 Allergic rhinitis, unspecified: Secondary | ICD-10-CM | POA: Diagnosis not present

## 2020-01-28 ENCOUNTER — Other Ambulatory Visit: Payer: Self-pay

## 2020-01-28 ENCOUNTER — Ambulatory Visit (INDEPENDENT_AMBULATORY_CARE_PROVIDER_SITE_OTHER): Payer: Medicare Other

## 2020-01-28 ENCOUNTER — Ambulatory Visit (INDEPENDENT_AMBULATORY_CARE_PROVIDER_SITE_OTHER): Payer: Medicare Other | Admitting: Family

## 2020-01-28 ENCOUNTER — Encounter: Payer: Self-pay | Admitting: Family

## 2020-01-28 VITALS — BP 126/82 | HR 78 | Ht 62.0 in

## 2020-01-28 DIAGNOSIS — J309 Allergic rhinitis, unspecified: Secondary | ICD-10-CM

## 2020-01-28 DIAGNOSIS — R42 Dizziness and giddiness: Secondary | ICD-10-CM

## 2020-01-28 DIAGNOSIS — G43719 Chronic migraine without aura, intractable, without status migrainosus: Secondary | ICD-10-CM | POA: Diagnosis not present

## 2020-01-28 MED ORDER — MECLIZINE HCL 12.5 MG PO TABS: 12.5000 mg | ORAL_TABLET | Freq: Three times a day (TID) | ORAL | 0 refills | Status: DC | PRN

## 2020-01-28 NOTE — Progress Notes (Signed)
Lori [name] is a 64 y.o. female with the following history as recorded in EpicCare:  Patient Active Problem List   Diagnosis Date Noted  . Genital herpes 11/12/2019  . Coccyx pain 11/12/2019  . Insomnia 11/12/2019  . Acute right-sided low back pain without sciatica 07/20/2019  . Grief 07/20/2019  . Vitamin D deficiency 07/20/2019  . Hyperglycemia 04/21/2019  . Left knee pain 04/21/2019  . Pain 12/29/2018  . B12 deficiency 10/20/2018  . Nausea 04/27/2018  . Anemia, iron deficiency 04/27/2018  . Intermittent paresthesia of hand and foot 04/27/2018  . Headache 03/21/2018  . Lumbar radiculitis 08/15/2017  . Degenerative disc disease, lumbar 07/21/2017  . Encounter for well adult exam with abnormal findings 06/30/2017  . Epigastric pain 06/30/2017  . Restrictive lung disease 06/30/2017  . Leg cramping 06/09/2017  . Elbow contusion 06/09/2017  . Right elbow pain 05/27/2017  . Asthma-COPD overlap syndrome ([place]) 01/31/2017  . Dizziness 01/20/2017  . Seasonal and perennial allergic rhinitis 01/01/2017  . Moderate persistent asthma, uncomplicated [id number]  . Pulmonary emphysema ([place]) 12/03/2016  . Tobacco use disorder 12/03/2016  . Chronic seasonal allergic rhinitis 12/03/2016  . Medication management 11/29/2016  . Cough 11/23/2016  . Rash 11/23/2016  . Vertigo 10/22/2016  . Gastroesophageal reflux disease with esophagitis 10/22/2016  . Non compliance w medication regimen 08/01/2016  . Allergic contact dermatitis due to metals 08/01/2016  . Migraine without aura and without status migrainosus, not intractable 07/22/2016  . Spasm of muscle of lower back 06/21/2016  . Chronic rhinitis 06/19/2016  . Gait difficulty 06/21/2015  . Fibromyalgia 06/21/2015  . Conversion reaction 06/21/2015  . Depression 06/21/2015  . Syncope and collapse 06/21/2015  . Plica syndrome of left knee 01/06/2015  . Anxiety state 09/06/2014  . History of MI 09/06/2014  . Cigarette smoker 06/27/2013   . Borderline hypertension 06/27/2013  . Chronic low back pain with sciatica 06/14/2013  . Leg pain, bilateral 06/14/2013  . Gastroparesis 10/26/2009  . Musculoskeletal chest pain 05/02/2009  . Hyperlipidemia LDL goal <100 11/18/2008  . COPD exacerbation ([place]) 11/18/2008  . HEPATIC CYST 11/18/2008    Current Outpatient Medications  Medication Sig Dispense Refill  . ALPRAZolam (XANAX) 1 MG tablet Take 1 tablet (1 mg total) by mouth at bedtime as needed for anxiety. 90 tablet 1  . atorvastatin (LIPITOR) 20 MG tablet Take 1 tablet (20 mg total) by mouth daily. 90 tablet 3  . citalopram (CELEXA) 10 MG tablet Take 1 tablet (10 mg total) by mouth daily. 90 tablet 3  . cyclobenzaprine (FLEXERIL) 5 MG tablet Take 1 tablet (5 mg total) by mouth 3 (three) times daily as needed. for muscle spams 60 tablet 2  . DEXILANT 60 MG capsule TAKE 1 CAPSULE BY MOUTH  DAILY 90 capsule 3  . EPINEPHrine 0.3 mg/0.3 mL IJ SOAJ injection INJECT AS DIRECTED FOR SEVERE ALLERGIC REACTION 2 each 0  . FASENRA 30 MG/ML SOSY INJECT 30MG  SUBCUTANEOUSLY  EVERY 8 WEEKS (GIVEN AT  PRESCRIBERS OFFICE) 1 mL 8  . iron polysaccharides (NU-IRON) 150 MG capsule Take 1 capsule (150 mg total) by mouth daily. 90 capsule 1  . levalbuterol (XOPENEX HFA) 45 MCG/ACT inhaler Use 4 puffs every 4-6 hours as needed. 1 Inhaler 2  . meclizine (ANTIVERT) 12.5 MG tablet Take 1 tablet (12.5 mg total) by mouth 3 (three) times daily as needed for dizziness. 60 tablet 0  . mometasone-formoterol (DULERA) 200-5 MCG/ACT AERO Inhale 2 puffs into the lungs daily for 30 days.  39 g 1  . mometasone-formoterol (DULERA) 200-5 MCG/ACT AERO Use 2 puffs once daily with spacer. 13 g 5  . montelukast (SINGULAIR) 10 MG tablet Take 1 tablet (10 mg total) by mouth at bedtime. 90 tablet 3  . nitroGLYCERIN (NITROSTAT) 0.4 MG SL tablet DISSOLVE ONE TABLET UNDER THE TONGUE EVERY 5 MINUTES AS NEEDED FOR CHEST PAIN.  DO NOT EXCEED A TOTAL OF 3 DOSES IN 15 MINUTES 25 tablet 0   . ondansetron (ZOFRAN) 4 MG tablet Take 1 tablet (4 mg total) by mouth every 8 (eight) hours as needed for nausea or vomiting. 30 tablet 0  . predniSONE (DELTASONE) 10 MG tablet Take 3 tablets (30 mg total) by mouth daily with breakfast. 15 tablet 0  . pregabalin (LYRICA) 75 MG capsule Take 1 capsule (75 mg total) by mouth 2 (two) times daily as needed (nerve pain/stinging). 60 capsule 1  . PROAIR HFA 108 (90 Base) MCG/ACT inhaler Inhale 1-2 puffs into the lungs every 4 (four) hours as needed for shortness of breath or wheezing. 54 g 0  . rosuvastatin (CRESTOR) 20 MG tablet Take 1 tablet (20 mg total) by mouth daily. 90 tablet 3  . traMADol (ULTRAM) 50 MG tablet Take 1 tablet (50 mg total) by mouth every 8 (eight) hours as needed for severe pain. 10 tablet 0  . valACYclovir (VALTREX) 1000 MG tablet Take 1 tablet (1,000 mg total) by mouth 2 (two) times daily. 180 tablet 3  . Vitamin D, Ergocalciferol, (DRISDOL) 1.25 MG (50000 UNIT) CAPS capsule Take 1 capsule (50,000 Units total) by mouth every 7 (seven) days. 12 capsule 0   Current Facility-Administered Medications  Medication Dose Route Frequency Provider Last Rate Last Admin  . Benralizumab SOSY 30 mg  30 mg Subcutaneous Q8 Toney Reil, MD   30 mg at 01/04/20 1618    Allergies: Bee venom, Codeine, Ibuprofen, Clonidine derivatives, Tramadol, Latex, Levofloxacin, and Propoxyphene n-acetaminophen  Past Medical History:  Diagnosis Date  . Acute MI ([place])    x3 - by report only. She has had 3 negative Myoview stress test with no evidence of prior infarct.  . Allergic rhinitis   . Allergy   . Anemia   . Ankle fracture 05/2016  . Anxiety   . Arthritis   . Asthma   . Barrett esophagus 2007  . Chest pain 05-02-2009   echo  EF 55%  . COPD (chronic obstructive pulmonary disease) with chronic bronchitis ([facility])    & Emphysema  . Depression   . Duodenitis without mention of hemorrhage 2007  . Esophageal reflux 2007  . Esophageal  stricture   . Full dentures   . H/O hiatal hernia   . Hiatal hernia [id number]  . Hyperlipidemia   . Multiple fractures    from falls, fx rt. elbow, fx left wrist, bilateral ankles  . Pneumonia 10/2016  . Restrictive lung disease 06/30/2017  . Ulcer     Past Surgical History:  Procedure Laterality Date  . ABDOMINAL HYSTERECTOMY     BSO  . APPENDECTOMY    . CESAREAN SECTION     x 2  . CHOLECYSTECTOMY    . CHONDROPLASTY Left 01/06/2015   Procedure: CHONDROPLASTY;  Surgeon: Marybelle Killings, MD;  Location: [facility];  Service: Orthopedics;  Laterality: Left;  . COLONOSCOPY    . ELBOW FRACTURE SURGERY     right  . KNEE ARTHROSCOPY WITH EXCISION PLICA Left [id number]   Procedure: KNEE ARTHROSCOPY WITH EXCISION PLICA;  Surgeon: Marybelle Killings, MD;  Location: [facility];  Service: Orthopedics;  Laterality: Left;  . Lower Extremity Arterial Dopplers  07/06/2013   RABI 1.0, LABI 1.1.; No evidence of significant vascular atherogenic plaque  . NECK SURGERY    . NM MYOVIEW LTD  09/2014   LOW RISK. Normal EF of 65% with no regional wall motion abnormalities. No ischemia or infarction.  . TONSILLECTOMY    . WRIST FRACTURE SURGERY     left, has plate    Family History  Problem Relation Age of Onset  . Emphysema Father   . Dementia Mother   . Diabetes Maternal Grandmother   . Diabetes Maternal Aunt   . Diabetes Other        mat. cousin  . Heart disease Brother   . Colon cancer Neg Hx   . Allergic rhinitis Neg Hx   . Angioedema Neg Hx   . Asthma Neg Hx   . Atopy Neg Hx   . Eczema Neg Hx   . Immunodeficiency Neg Hx   . Urticaria Neg Hx     Social History   Tobacco Use  . Smoking status: Current Every Day Smoker    Packs/day: 0.50    Years: 39.00    Pack years: 19.50    Types: Cigarettes    Start date: 08/06/1977  . Smokeless tobacco: Never Used  Substance Use Topics  . Alcohol use: No    Alcohol/week: 0.0 standard drinks    Subjective:    Patient came to the office with a friend who had a regularly scheduled appointment. While leaving the office, she noted that she began to feel very dizzy and "felt like the room was spinning." Her non fasting blood sugar was checked at 130- she notes she had eaten chicken sandwich, fries and tea for lunch; Her initial blood pressure when checked was 160/88- at re-check during office visit, it had normalized to 126/82; patient denies any chest pain or shortness of breath or vision changes; she does not feel nauseated; she notes that she just feels off balance; of note, she did get an Emgality shot from her neurologist earlier today- has taken these in the past with no issues;   Objective:  Vitals:   01/28/20 1420  BP: 126/82  Pulse: 78  SpO2: 98%  Height: 5\' 2"  (1.575 m)    General: Well developed, well nourished, in no acute distress  Skin : Warm and dry.  Head: Normocephalic and atraumatic  Eyes: Sclera and conjunctiva clear; pupils round and reactive to light; extraocular movements intact  Ears: External normal; canals clear; tympanic membranes normal  Oropharynx: Pink, supple. No suspicious lesions  Neck: Supple without thyromegaly, adenopathy  Lungs: Respirations unlabored; clear to auscultation bilaterally without wheeze, rales, rhonchi  CVS exam: normal rate and regular rhythm.  Neurologic: Alert and oriented; speech intact; face symmetrical; moves all extremities well; CNII-XII intact without focal deficit   Assessment:  1. Dizziness     Plan:  Physical exam is reassuring; EKG shows NSR; suspect vertigo; she has Antivert on her medication list but notes she does not remember when or why this medication was prescribed; refill is updated; ER precautions for the upcoming weekend; keep planned follow-up with her PCP for 2 weeks from now- if symptoms persist, to consider imaging/ vestibular therapy.  This visit occurred during the SARS-CoV-2 public health emergency.  Safety protocols  were in place, including screening questions prior to the visit, additional usage of staff  PPE, and extensive cleaning of exam room while observing appropriate contact time as indicated for disinfecting solutions.     No follow-ups on file.  No orders of the defined types were placed in this encounter.   Requested Prescriptions   Signed Prescriptions Disp Refills  . meclizine (ANTIVERT) 12.5 MG tablet 60 tablet 0    Sig: Take 1 tablet (12.5 mg total) by mouth 3 (three) times daily as needed for dizziness.

## 2020-01-28 NOTE — Patient Instructions (Signed)
Vertigo Vertigo is the feeling that you or your surroundings are moving when they are not. This feeling can come and go at any time. Vertigo often goes away on its own. Vertigo can be dangerous if it occurs while you are doing something that could endanger you or others, such as driving or operating machinery. Your health care provider will do tests to determine the cause of your vertigo. Tests will also help your health care provider decide how best to treat your condition. Follow these instructions at home: Eating and drinking      Drink enough fluid to keep your urine pale yellow.  Do not drink alcohol. Activity  Return to your normal activities as told by your health care provider. Ask your health care provider what activities are safe for you.  In the morning, first sit up on the side of the bed. When you feel okay, stand slowly while you hold onto something until you know that your balance is fine.  Move slowly. Avoid sudden body or head movements or certain positions, as told by your health care provider.  If you have trouble walking or keeping your balance, try using a cane for stability. If you feel dizzy or unstable, sit down right away.  Avoid doing any tasks that would cause danger to you or others if vertigo occurs.  Avoid bending down if you feel dizzy. Place items in your home so that they are easy for you to reach without leaning over.  Do not drive or use heavy machinery if you feel dizzy. General instructions  Take over-the-counter and prescription medicines only as told by your health care provider.  Keep all follow-up visits as told by your health care provider. This is important. Contact a health care provider if:  Your medicines do not relieve your vertigo or they make it worse.  You have a fever.  Your condition gets worse or you develop new symptoms.  Your family or friends notice any behavioral changes.  Your nausea or vomiting gets worse.  You  have numbness or a prickling and tingling sensation in part of your body. Get help right away if you:  Have difficulty moving or speaking.  Are always dizzy.  Faint.  Develop severe headaches.  Have weakness in your hands, arms, or legs.  Have changes in your hearing or vision.  Develop a stiff neck.  Develop sensitivity to light. Summary  Vertigo is the feeling that you or your surroundings are moving when they are not.  Your health care provider will do tests to determine the cause of your vertigo.  Follow instructions for home care. You may be told to avoid certain tasks, positions, or movements.  Contact a health care provider if your medicines do not relieve your symptoms, or if you have a fever, nausea, vomiting, or changes in behavior.  Get help right away if you have severe headaches or difficulty speaking, or you develop hearing or vision problems. This information is not intended to replace advice given to you by your health care provider. Make sure you discuss any questions you have with your health care provider. Document Revised: 07/27/2018 Document Reviewed: 07/27/2018 Elsevier Patient Education  2020 [employer]  

## 2020-01-31 ENCOUNTER — Ambulatory Visit (INDEPENDENT_AMBULATORY_CARE_PROVIDER_SITE_OTHER)
Admission: RE | Admit: 2020-01-31 | Discharge: 2020-01-31 | Disposition: A | Discharge status: Home / Self Care | Payer: Medicare Other | Source: Ambulatory Visit | Attending: Family Medicine | Admitting: Family Medicine

## 2020-01-31 ENCOUNTER — Encounter: Payer: Self-pay | Admitting: Family Medicine

## 2020-01-31 ENCOUNTER — Ambulatory Visit (INDEPENDENT_AMBULATORY_CARE_PROVIDER_SITE_OTHER): Payer: Medicare Other | Admitting: Family Medicine

## 2020-01-31 ENCOUNTER — Ambulatory Visit: Payer: Self-pay

## 2020-01-31 ENCOUNTER — Other Ambulatory Visit: Payer: Self-pay

## 2020-01-31 VITALS — BP 122/84 | HR 73 | Ht 62.0 in | Wt 129.0 lb

## 2020-01-31 DIAGNOSIS — M79641 Pain in right hand: Secondary | ICD-10-CM

## 2020-01-31 DIAGNOSIS — M19041 Primary osteoarthritis, right hand: Secondary | ICD-10-CM | POA: Diagnosis not present

## 2020-01-31 DIAGNOSIS — M25531 Pain in right wrist: Secondary | ICD-10-CM

## 2020-01-31 NOTE — Patient Instructions (Signed)
Thank you for coming in today. Get xray today.  Recheck in 1 week.  Ok to remove splint if needed.  Take the splint off for the xray.

## 2020-01-31 NOTE — Progress Notes (Signed)
Lori [name], am serving as a Education administrator for [name].  REALYNN [name] is a 64 y.o. female who presents to [facility] at [facility] today for follow up of R hand pain. Patient was last seen by [name] on 01/17/2020 where she stated she fell prior to the last visit sometime in April. Pain in wrist, palmar aspect and CMC joint.  On May 3 when she was seen in clinic wrist x-ray was relatively normal.  She was complaining of diffuse wrist pain into the hand and was guarding quite a bit with exam.  It was unclear at that time what the diagnosis was.  She was given a conventional wrist brace tramadol Lyrica and prednisone.  This did not help much at all and continues to have quite a bit of pain.  She notes today the pain continues to be diffuse into the hand and wrist however she notes that the thumb MCP and IP joints are especially painful.  These were not well visualized with wrist x-ray ordered at the last visit.  She notes continued pain and difficulty with normal hand activities.  She notes hand motion is painful and she is having some difficulty sleeping.  She rates his pain as up to severe.    Pertinent review of systems: No fevers or chills  Relevant historical information: COPD   Exam:  BP 122/84 (BP Location: Left Arm, Patient Position: Sitting, Cuff Size: Normal)   Pulse 73   Ht 5\' 2"  (1.575 m)   Wt 129 lb (58.5 kg)   SpO2 97%   BMI 23.59 kg/m  General: Well Developed, well nourished, and in no acute distress.   MSK: Right hand and wrist very slight swelling at the thumb MCP and IP otherwise normal-appearing.  No erythema.  Normal skin appearance with no bruising. Decreased motion wrist to flexion extension ulnar deviation and radial deviation.  Pain with these motions. Decreased motion of the hand with decreased flexion of the fingers.  Normal extension. Strength is intact within limits of motion to flexion and extension however is painful. Pulses  capillary refill and sensation are intact distally.    Lab and Radiology Results  Diagnostic Limited MSK Ultrasound of: Right hand and wrist Volar wrist shows intact appearing flexor tendons and median nerve with no obvious abnormality. Bony structures are normal to limited musculoskeletal exam with no obvious hypoechoic hematoma around the bone indicating fracture. Dorsal hand and wrist normal-appearing with no obvious tendon defect. Degenerative changes present at first [facility] with mild joint effusion present. Intact pulses to ultrasound examination of both radial and ulnar artery. Impression: First CMC DJD otherwise normal   X-ray images right hand obtained today personally and independently reviewed. No acute fractures visible.  DJD first [place] present. No obvious scapholunate separation on x-ray. Await formal radiology review   Ortho-Glass splint thumb spica made in clinic well formed. Patient felt more comfortable with the splint and pain was improved.  Assessment and Plan: 64 y.o. female with right hand pain.  Patient suffered fall in April and has had severe pain into the hand since.  She is failing initial conservative management of oral prednisone tramadol and Lyrica.  Her pain is diffuse and does not follow dermatomal patterns of typical nerve impingement or cervical nerve root injury. Patient has intact pulses and capillary refill on exam indicating vascular compromise is not likely. Patient was given a well-made thumb spica splint with Ortho-Glass to in clinic today. Plan to  treat with immobilization with well-made thumb spica splint and recheck in 1 week. We will repeat exam at that point.  If still not clear what the etiology is likely would proceed with MRI arthrogram.  If still not clear nerve conduction study and vascular studies will be next.   PDMP not reviewed this encounter. Orders Placed This Encounter  Procedures  . [place] LIMITED JOINT SPACE STRUCTURES UP RIGHT(NO  LINKED CHARGES)    Order Specific Question:   Reason for Exam (SYMPTOM  OR DIAGNOSIS REQUIRED)    Answer:   eval wrist pain    Order Specific Question:   Preferred imaging location?    Answer:   [facility]  . DG Hand Complete Right    Standing Status:   Future    Number of Occurrences:   1    Standing Expiration Date:   04/01/2021    Order Specific Question:   Reason for Exam (SYMPTOM  OR DIAGNOSIS REQUIRED)    Answer:   eval hand and wrist pain    Order Specific Question:   Preferred imaging location?    Answer:   [facility]    Order Specific Question:   Radiology Contrast Protocol - do NOT remove file path    Answer:   [email or web address]   No orders of the defined types were placed in this encounter.    Discussed warning signs or symptoms. Please see discharge instructions. Patient expresses understanding.   The above documentation has been reviewed and is accurate and complete Lynne Leader, M.D.

## 2020-02-01 ENCOUNTER — Ambulatory Visit: Payer: Self-pay

## 2020-02-01 NOTE — Progress Notes (Signed)
Hand xray shows a little arthritis in the thumb.  No fracture seen in the wrist or hand.

## 2020-02-04 ENCOUNTER — Ambulatory Visit (INDEPENDENT_AMBULATORY_CARE_PROVIDER_SITE_OTHER): Payer: Medicare Other

## 2020-02-04 DIAGNOSIS — J309 Allergic rhinitis, unspecified: Secondary | ICD-10-CM | POA: Diagnosis not present

## 2020-02-07 ENCOUNTER — Ambulatory Visit (INDEPENDENT_AMBULATORY_CARE_PROVIDER_SITE_OTHER): Payer: Medicare Other

## 2020-02-07 ENCOUNTER — Ambulatory Visit: Payer: Self-pay

## 2020-02-07 ENCOUNTER — Other Ambulatory Visit: Payer: Self-pay

## 2020-02-07 DIAGNOSIS — J4550 Severe persistent asthma, uncomplicated: Secondary | ICD-10-CM

## 2020-02-07 DIAGNOSIS — J449 Chronic obstructive pulmonary disease, unspecified: Secondary | ICD-10-CM

## 2020-02-10 ENCOUNTER — Ambulatory Visit (INDEPENDENT_AMBULATORY_CARE_PROVIDER_SITE_OTHER): Payer: Medicare Other | Admitting: Internal Medicine

## 2020-02-10 ENCOUNTER — Encounter: Payer: Self-pay | Admitting: Internal Medicine

## 2020-02-10 ENCOUNTER — Other Ambulatory Visit: Payer: Self-pay

## 2020-02-10 ENCOUNTER — Ambulatory Visit (INDEPENDENT_AMBULATORY_CARE_PROVIDER_SITE_OTHER): Payer: Medicare Other | Admitting: Family Medicine

## 2020-02-10 ENCOUNTER — Ambulatory Visit: Payer: Self-pay

## 2020-02-10 VITALS — BP 124/80 | HR 71 | Temp 98.0°F | Ht 62.0 in | Wt 128.8 lb

## 2020-02-10 VITALS — Ht 62.0 in

## 2020-02-10 DIAGNOSIS — E559 Vitamin D deficiency, unspecified: Secondary | ICD-10-CM | POA: Diagnosis not present

## 2020-02-10 DIAGNOSIS — E538 Deficiency of other specified B group vitamins: Secondary | ICD-10-CM | POA: Diagnosis not present

## 2020-02-10 DIAGNOSIS — F411 Generalized anxiety disorder: Secondary | ICD-10-CM

## 2020-02-10 DIAGNOSIS — R42 Dizziness and giddiness: Secondary | ICD-10-CM

## 2020-02-10 DIAGNOSIS — M79644 Pain in right finger(s): Secondary | ICD-10-CM | POA: Diagnosis not present

## 2020-02-10 DIAGNOSIS — J302 Other seasonal allergic rhinitis: Secondary | ICD-10-CM

## 2020-02-10 DIAGNOSIS — G4700 Insomnia, unspecified: Secondary | ICD-10-CM | POA: Diagnosis not present

## 2020-02-10 DIAGNOSIS — J3089 Other allergic rhinitis: Secondary | ICD-10-CM | POA: Diagnosis not present

## 2020-02-10 MED ORDER — MECLIZINE HCL 12.5 MG PO TABS: 12.5000 mg | ORAL_TABLET | Freq: Three times a day (TID) | ORAL | 0 refills | Status: DC | PRN

## 2020-02-10 MED ORDER — VITAMIN D (ERGOCALCIFEROL) 1.25 MG (50000 UNIT) PO CAPS: 50000.0000 [IU] | ORAL_CAPSULE | ORAL | 0 refills | Status: DC

## 2020-02-10 MED ORDER — METHYLPREDNISOLONE ACETATE 80 MG/ML IJ SUSP
80.0000 mg | Freq: Once | INTRAMUSCULAR | Status: AC
  Administered 2020-02-10: 80 mg via INTRAMUSCULAR

## 2020-02-10 MED ORDER — ALPRAZOLAM 1 MG PO TABS: 1.0000 mg | ORAL_TABLET | Freq: Every evening | ORAL | 1 refills | Status: DC | PRN

## 2020-02-10 MED ORDER — CYANOCOBALAMIN 1000 MCG/ML IJ SOLN
1000.0000 ug | Freq: Once | INTRAMUSCULAR | Status: AC
  Administered 2020-02-10: 1000 ug via INTRAMUSCULAR

## 2020-02-10 MED ORDER — PROMETHAZINE HCL 25 MG PO TABS: 25.0000 mg | ORAL_TABLET | Freq: Four times a day (QID) | ORAL | 1 refills | Status: DC | PRN

## 2020-02-10 NOTE — Patient Instructions (Signed)
Please take Vitamin D 50000 units weekly for 12 weeks, then plan to change to OTC Vitamin D3 at 2000 units per day, indefinitely.  You had the B12 shot and steroid shot for the low b12 and allergies  Please continue all other medications as before, and refills have been done if requested.  Please have the pharmacy call with any other refills you may need.  Please continue your efforts at being more active, low cholesterol diet, and weight control.  Please keep your appointments with your specialists as you may have planned  Please make an Appointment to return in 6 months, or sooner if needed

## 2020-02-10 NOTE — Progress Notes (Signed)
Subjective:    Patient ID: Lori [name], female    DOB: Jul 20, 1956, 64 y.o.   MRN: [id number]  HPI  Here to f/u; overall doing ok,  Pt denies chest pain, increasing sob or doe, wheezing, orthopnea, PND, increased LE swelling, palpitations, dizziness or syncope.  Pt denies new neurological symptoms such as new headache, or facial or extremity weakness or numbness.  Pt denies polydipsia, polyuria, or low sugar episode.  Pt states overall good compliance with meds,Does have several wks ongoing nasal allergy symptoms with clearish congestion, itch and sneezing, without fever, pain, ST, cough, swelling or wheezing.  Has been taking her vit d,  Has occasional intermittent vertigo, asks for meclizine refill, and for refill xanax qhs for ongoing insomnia chronic stable  Denies worsening depressive symptoms, suicidal ideation, or panic; has ongoing anxiety Past Medical History:  Diagnosis Date  . Acute MI ([place])    x3 - by report only. She has had 3 negative Myoview stress test with no evidence of prior infarct.  . Allergic rhinitis   . Allergy   . Anemia   . Ankle fracture 05/2016  . Anxiety   . Arthritis   . Asthma   . Barrett esophagus 2007  . Chest pain 05-02-2009   echo  EF 55%  . COPD (chronic obstructive pulmonary disease) with chronic bronchitis ([facility])    & Emphysema  . Depression   . Duodenitis without mention of hemorrhage 2007  . Esophageal reflux 2007  . Esophageal stricture   . Full dentures   . H/O hiatal hernia   . Hiatal hernia [id number]  . Hyperlipidemia   . Multiple fractures    from falls, fx rt. elbow, fx left wrist, bilateral ankles  . Pneumonia 10/2016  . Restrictive lung disease 06/30/2017  . Ulcer    Past Surgical History:  Procedure Laterality Date  . ABDOMINAL HYSTERECTOMY     BSO  . APPENDECTOMY    . CESAREAN SECTION     x 2  . CHOLECYSTECTOMY    . CHONDROPLASTY Left 01/06/2015   Procedure: CHONDROPLASTY;  Surgeon: Marybelle Killings, MD;  Location: [facility];  Service: Orthopedics;  Laterality: Left;  . COLONOSCOPY    . ELBOW FRACTURE SURGERY     right  . KNEE ARTHROSCOPY WITH EXCISION PLICA Left 0/26/3785   Procedure: KNEE ARTHROSCOPY WITH EXCISION PLICA;  Surgeon: Marybelle Killings, MD;  Location: [facility];  Service: Orthopedics;  Laterality: Left;  . Lower Extremity Arterial Dopplers  07/06/2013   RABI 1.0, LABI 1.1.; No evidence of significant vascular atherogenic plaque  . NECK SURGERY    . NM MYOVIEW LTD  09/2014   LOW RISK. Normal EF of 65% with no regional wall motion abnormalities. No ischemia or infarction.  . TONSILLECTOMY    . WRIST FRACTURE SURGERY     left, has plate    reports that she has been smoking cigarettes. She started smoking about 42 years ago. She has a 19.50 pack-year smoking history. She has never used smokeless tobacco. She reports that she does not drink alcohol or use drugs. family history includes Dementia in her mother; Diabetes in her maternal aunt, maternal grandmother, and another family member; Emphysema in her father; Heart disease in her brother. Allergies  Allergen Reactions  . Bee Venom Swelling  . Codeine Nausea Only and Other (See Comments)    CAUSES ULCERS  . Ibuprofen Nausea And Vomiting  . Clonidine Derivatives Other (See Comments)  Unknown   . Statins   . Tramadol Nausea Only  . Latex Rash  . Levofloxacin Nausea Only  . Propoxyphene N-Acetaminophen Nausea Only   Current Outpatient Medications on File Prior to Visit  Medication Sig Dispense Refill  . atorvastatin (LIPITOR) 20 MG tablet Take 1 tablet (20 mg total) by mouth daily. 90 tablet 3  . citalopram (CELEXA) 10 MG tablet Take 1 tablet (10 mg total) by mouth daily. 90 tablet 3  . cyclobenzaprine (FLEXERIL) 5 MG tablet Take 1 tablet (5 mg total) by mouth 3 (three) times daily as needed. for muscle spams 60 tablet 2  . DEXILANT 60 MG capsule TAKE 1 CAPSULE BY MOUTH  DAILY 90 capsule 3  . diclofenac  (VOLTAREN) 50 MG EC tablet     . EMGALITY 120 MG/ML SOAJ Inject 1 mL into the skin every 30 (thirty) days.    Marland Kitchen EPINEPHrine 0.3 mg/0.3 mL IJ SOAJ injection INJECT AS DIRECTED FOR SEVERE ALLERGIC REACTION 2 each 0  . FASENRA 30 MG/ML SOSY INJECT 30MG SUBCUTANEOUSLY  EVERY 8 WEEKS (GIVEN AT  PRESCRIBERS OFFICE) 1 mL 8  . gabapentin (NEURONTIN) 100 MG capsule TAKE 1 CAPSULE BY MOUTH TWICE DAILY AS NEEDED FOR HEADACHE. LIMIT USE TO 1 2 DAYS WEEK    . levalbuterol (XOPENEX HFA) 45 MCG/ACT inhaler Use 4 puffs every 4-6 hours as needed. 1 Inhaler 2  . mometasone-formoterol (DULERA) 200-5 MCG/ACT AERO Inhale 2 puffs into the lungs daily for 30 days. 39 g 1  . mometasone-formoterol (DULERA) 200-5 MCG/ACT AERO Use 2 puffs once daily with spacer. 13 g 5  . montelukast (SINGULAIR) 10 MG tablet Take 1 tablet (10 mg total) by mouth at bedtime. 90 tablet 3  . NARCAN 4 MG/0.1ML LIQD nasal spray kit CALL 911. ADMINISTER A SINGLE SPRAY OF NARCAN IN ONE NOSTRIL. REPEAT EVERY 3 MINUTES AS NEEDED IF NO OR MINIMAL RESPONSE.    . nitroGLYCERIN (NITROSTAT) 0.4 MG SL tablet DISSOLVE ONE TABLET UNDER THE TONGUE EVERY 5 MINUTES AS NEEDED FOR CHEST PAIN.  DO NOT EXCEED A TOTAL OF 3 DOSES IN 15 MINUTES 25 tablet 0  . ondansetron (ZOFRAN) 4 MG tablet Take 1 tablet (4 mg total) by mouth every 8 (eight) hours as needed for nausea or vomiting. 30 tablet 0  . predniSONE (DELTASONE) 10 MG tablet Take 3 tablets (30 mg total) by mouth daily with breakfast. 15 tablet 0  . pregabalin (LYRICA) 75 MG capsule Take 1 capsule (75 mg total) by mouth 2 (two) times daily as needed (nerve pain/stinging). 60 capsule 1  . PROAIR HFA 108 (90 Base) MCG/ACT inhaler Inhale 1-2 puffs into the lungs every 4 (four) hours as needed for shortness of breath or wheezing. 54 g 0  . traMADol (ULTRAM) 50 MG tablet Take 1 tablet (50 mg total) by mouth every 8 (eight) hours as needed for severe pain. 10 tablet 0  . valACYclovir (VALTREX) 1000 MG tablet Take 1  tablet (1,000 mg total) by mouth 2 (two) times daily. 180 tablet 3  . valACYclovir (VALTREX) 500 MG tablet Take 500 mg by mouth daily.     Current Facility-Administered Medications on File Prior to Visit  Medication Dose Route Frequency Provider Last Rate Last Admin  . Benralizumab SOSY 30 mg  30 mg Subcutaneous Q8 Toney Reil, MD   30 mg at 02/07/20 0930   Review of Systems All otherwise neg per pt     Objective:   Physical Exam BP 124/80 (BP  Location: Left Arm, Patient Position: Sitting, Cuff Size: Normal)   Pulse 71   Temp 98 F (36.7 C) (Oral)   Ht '5\' 2"'  (1.575 m)   Wt 128 lb 12.8 oz (58.4 kg)   SpO2 97%   BMI 23.56 kg/m  VS noted,  Constitutional: Pt appears in NAD HENT: Head: NCAT.  Right Ear: External ear normal.  Left Ear: External ear normal.  Eyes: . Pupils are equal, round, and reactive to light. Conjunctivae and EOM are normal Nose: without d/c or deformity Neck: Neck supple. Gross normal ROM Cardiovascular: Normal rate and regular rhythm.   Pulmonary/Chest: Effort normal and breath sounds without rales or wheezing.  Abd:  Soft, NT, ND, + BS, no organomegaly Neurological: Pt is alert. At baseline orientation, motor grossly intact Skin: Skin is warm. No rashes, other new lesions, no LE edema Psychiatric: Pt behavior is normal without agitation  All otherwise neg per pt Lab Results  Component Value Date   WBC 8.8 11/12/2019   HGB 12.8 11/12/2019   HCT 38.3 11/12/2019   PLT 279.0 11/12/2019   GLUCOSE 80 11/12/2019   CHOL 246 (H) 11/12/2019   TRIG 388.0 (H) 11/12/2019   HDL 29.40 (L) 11/12/2019   LDLDIRECT 157.0 11/12/2019   LDLCALC Comment 03/12/2017   ALT 9 11/12/2019   AST 9 11/12/2019   NA 138 11/12/2019   K 3.6 11/12/2019   CL 109 11/12/2019   CREATININE 1.07 11/12/2019   BUN 11 11/12/2019   CO2 22 11/12/2019   TSH 1.45 11/12/2019   INR 0.9 08/31/2008   HGBA1C 5.6 11/12/2019      Assessment & Plan:

## 2020-02-10 NOTE — Progress Notes (Signed)
   I, Lori [name], LAT, ATC, am serving as scribe for [name].  Lori [name] is a 64 y.o. female who presents to [place] at [facility] today for f/u R wrist/hand pain after a fall in April 2021.  She was last seen by Dr. Georgina [name] on 01/31/20 and had a thumb spica splint made out of [employer]ogl[employer].  Since her last visit, pt reports continued thumb pain.  She notes the splint has been somewhat helpful although she finds it to be uncomfortable.  She notes that she is not willing to consider physical therapy or hand therapy at this time.  Diagnostic testing:  R wrist and elbow XT- 01/17/20; R hand XR- 01/31/20   Pertinent review of systems: No fevers or chills  Relevant historical information: Lung disease COPD constrictive lung disease.   Exam:  Ht 5\' 2"  (1.575 m)   BMI 23.56 kg/m  General: Well Developed, well nourished, and in no acute distress.   MSK: Right hand largely normal-appearing with no significant swelling.  Tender palpation MCP.  No laxity.  Somewhat restricted motion of flexion MCP and IP joint thumb.  Strength is intact within limits of range of motion however does reproduce pain. Wrist is nontender with normal motion.    Lab and Radiology Results  X-ray images right hand and wrist from earlier this month personally independent reviewed again.  Procedure: Real-time Ultrasound Guided Injection of right thumb MCP Device: Philips Affiniti 50G Images permanently stored and available for review in the ultrasound unit. Verbal informed consent obtained.  Discussed risks and benefits of procedure. Warned about infection bleeding damage to structures skin hypopigmentation and fat atrophy among others. Patient expresses understanding and agreement Time-out conducted.   Noted no overlying erythema, induration, or other signs of local infection.   Skin prepped in a sterile fashion.   Local anesthesia: Topical Ethyl chloride.   With sterile technique and  under real time ultrasound guidance:  40 mg of Depo-Medrol and 0.5 mL of Marcaine.  Total volume 1 mL.  Injected easily.   Completed without difficulty   Pain immediately resolved suggesting accurate placement of the medication.   Advised to call if fevers/chills, erythema, induration, drainage, or persistent bleeding.   Images permanently stored and available for review in the ultrasound unit.  Impression: Technically successful ultrasound guided injection.         Assessment and Plan: 64 y.o. female with right thumb pain after fall.  At this point most likely diagnosis is traumatic synovitis.  She would be a great candidate for physical therapy but is not willing to consider it at this time.  Plan for transition to an EXOS short thumb spica splint.  Injection as above.  Recheck in 2 to 4 weeks.   PDMP not reviewed this encounter. Orders Placed This Encounter  Procedures  . [place] LIMITED JOINT SPACE STRUCTURES UP RIGHT(NO LINKED CHARGES)    Order Specific Question:   Reason for Exam (SYMPTOM  OR DIAGNOSIS REQUIRED)    Answer:   R thumb pain    Order Specific Question:   Preferred imaging location?    Answer:   [facility]   No orders of the defined types were placed in this encounter.    Discussed warning signs or symptoms. Please see discharge instructions. Patient expresses understanding.   The above documentation has been reviewed and is accurate and complete Lynne Leader, M.D.

## 2020-02-10 NOTE — Patient Instructions (Signed)
Thank you for coming in today. Call or go to the ER if you develop a large red swollen joint with extreme pain or oozing puss.  Try the shorter splint.  Ok to use a nail file on the fiberglass splint.  Recheck in 2-4 weeks.   Physical therapy would help. We do home health PT where they come to your house.

## 2020-02-11 ENCOUNTER — Encounter: Payer: Self-pay | Admitting: Internal Medicine

## 2020-02-11 ENCOUNTER — Ambulatory Visit: Payer: Medicare Other | Admitting: Family Medicine

## 2020-02-11 NOTE — Assessment & Plan Note (Signed)
Cont oral replaement 

## 2020-02-11 NOTE — Assessment & Plan Note (Signed)
stable overall by history and exam, recent data reviewed with pt, and pt to continue medical treatment as before,  to f/u any worsening symptoms or concerns  

## 2020-02-11 NOTE — Assessment & Plan Note (Signed)
For xanax qhs prn refill

## 2020-02-11 NOTE — Assessment & Plan Note (Signed)
Stable, for meclizine refill

## 2020-02-11 NOTE — Assessment & Plan Note (Signed)
For b12 1000 mg im replacement,  to f/u any worsening symptoms or concerns

## 2020-02-11 NOTE — Assessment & Plan Note (Addendum)
Mod seasonal flare, for depomedrol im 80, . to f/u any worsening symptoms or concerns  I spent 31 minutes in preparing to see the patient by review of recent labs, imaging and procedures, obtaining and reviewing separately obtained history, communicating with the patient and family or caregiver, ordering medications, tests or procedures, and documenting clinical information in the EHR including the differential Dx, treatment, and any further evaluation and other management of allergies, b12 deficiency, vit d deficiency, insomnia, vertigo, anxiety

## 2020-02-22 IMAGING — CR DG CHEST 2V
2 series · 2 of 2 positions shown · non-contrast
Comparison: 11/10/2017

CLINICAL DATA: Cough.

EXAM:
CHEST - 2 VIEW

[w chest lat]
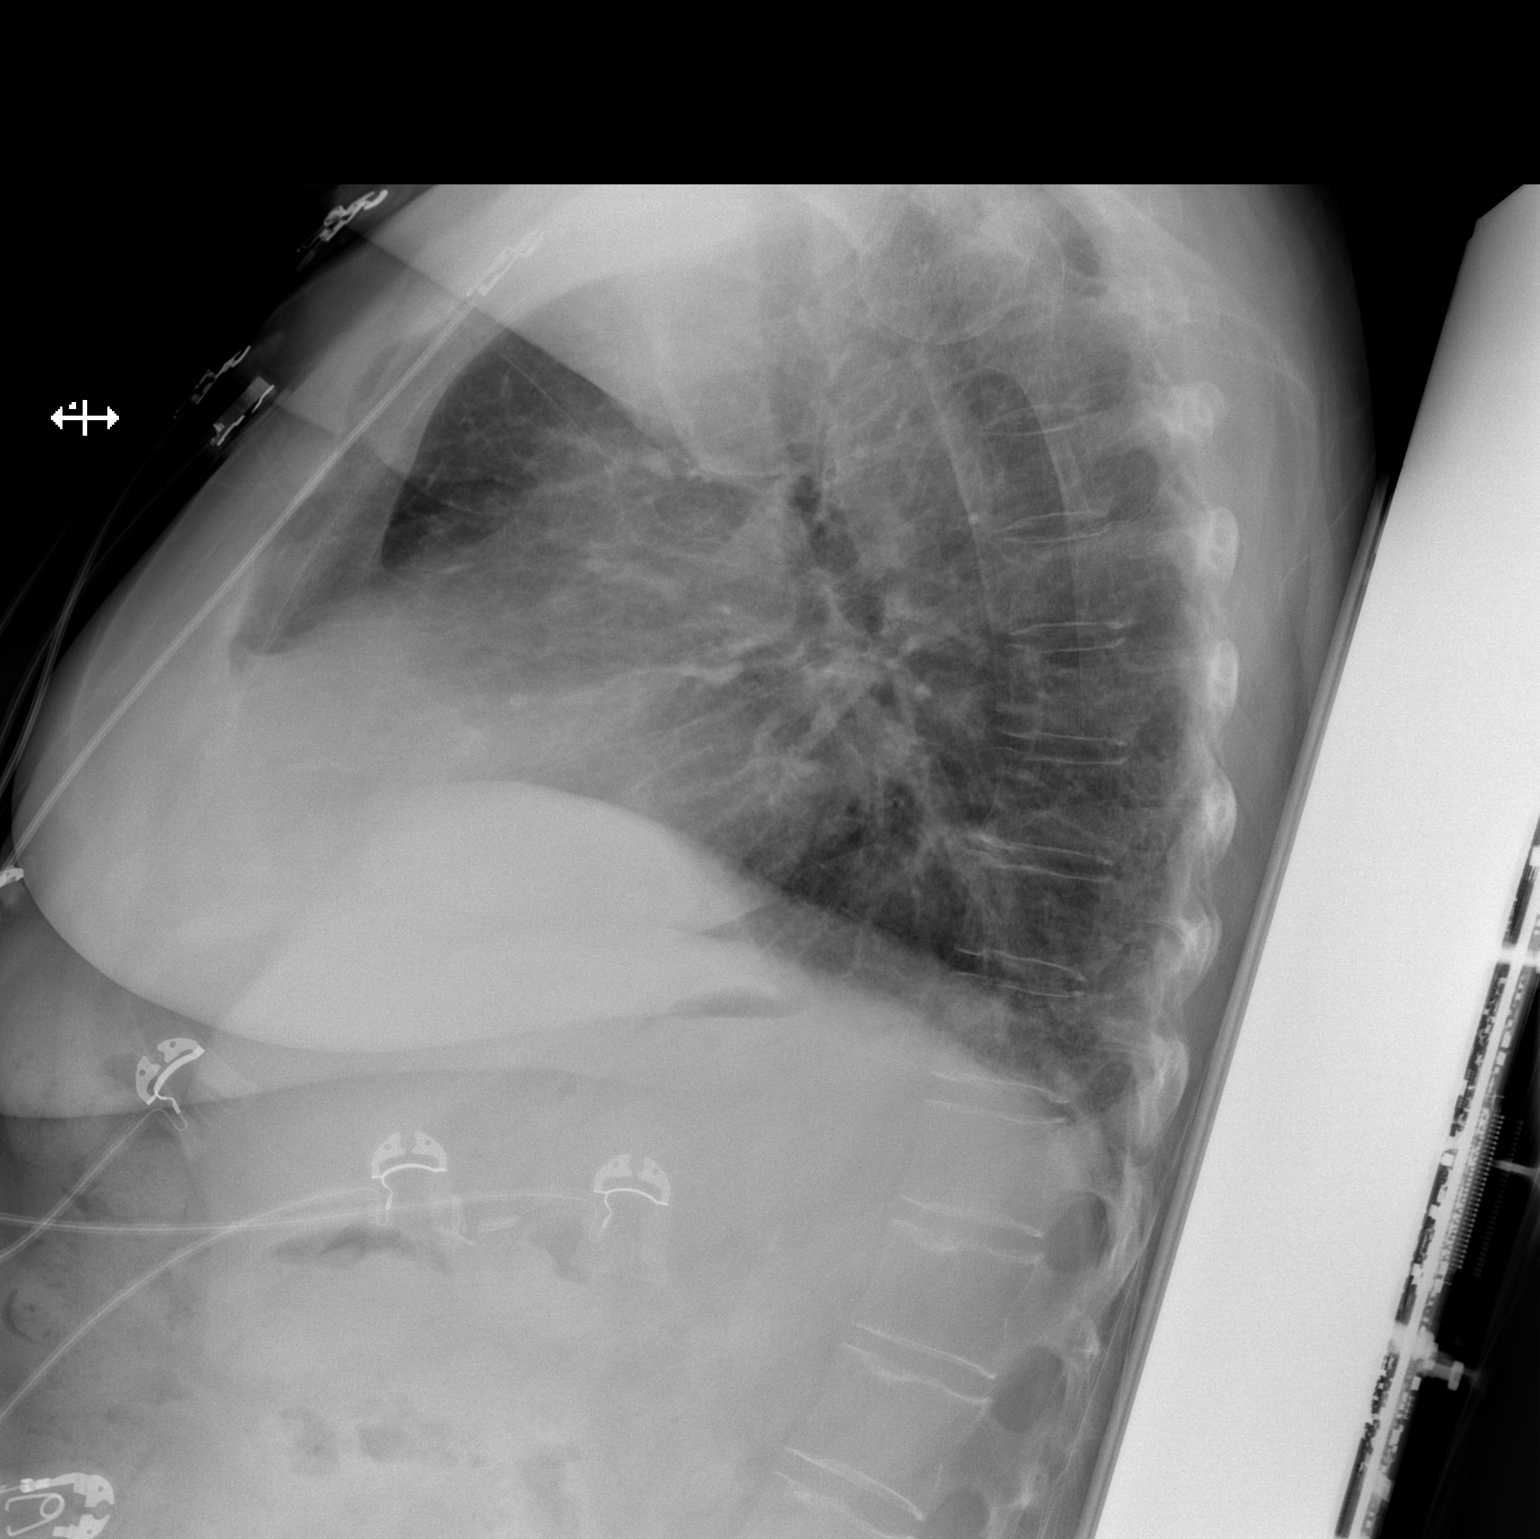

[x chest ap]
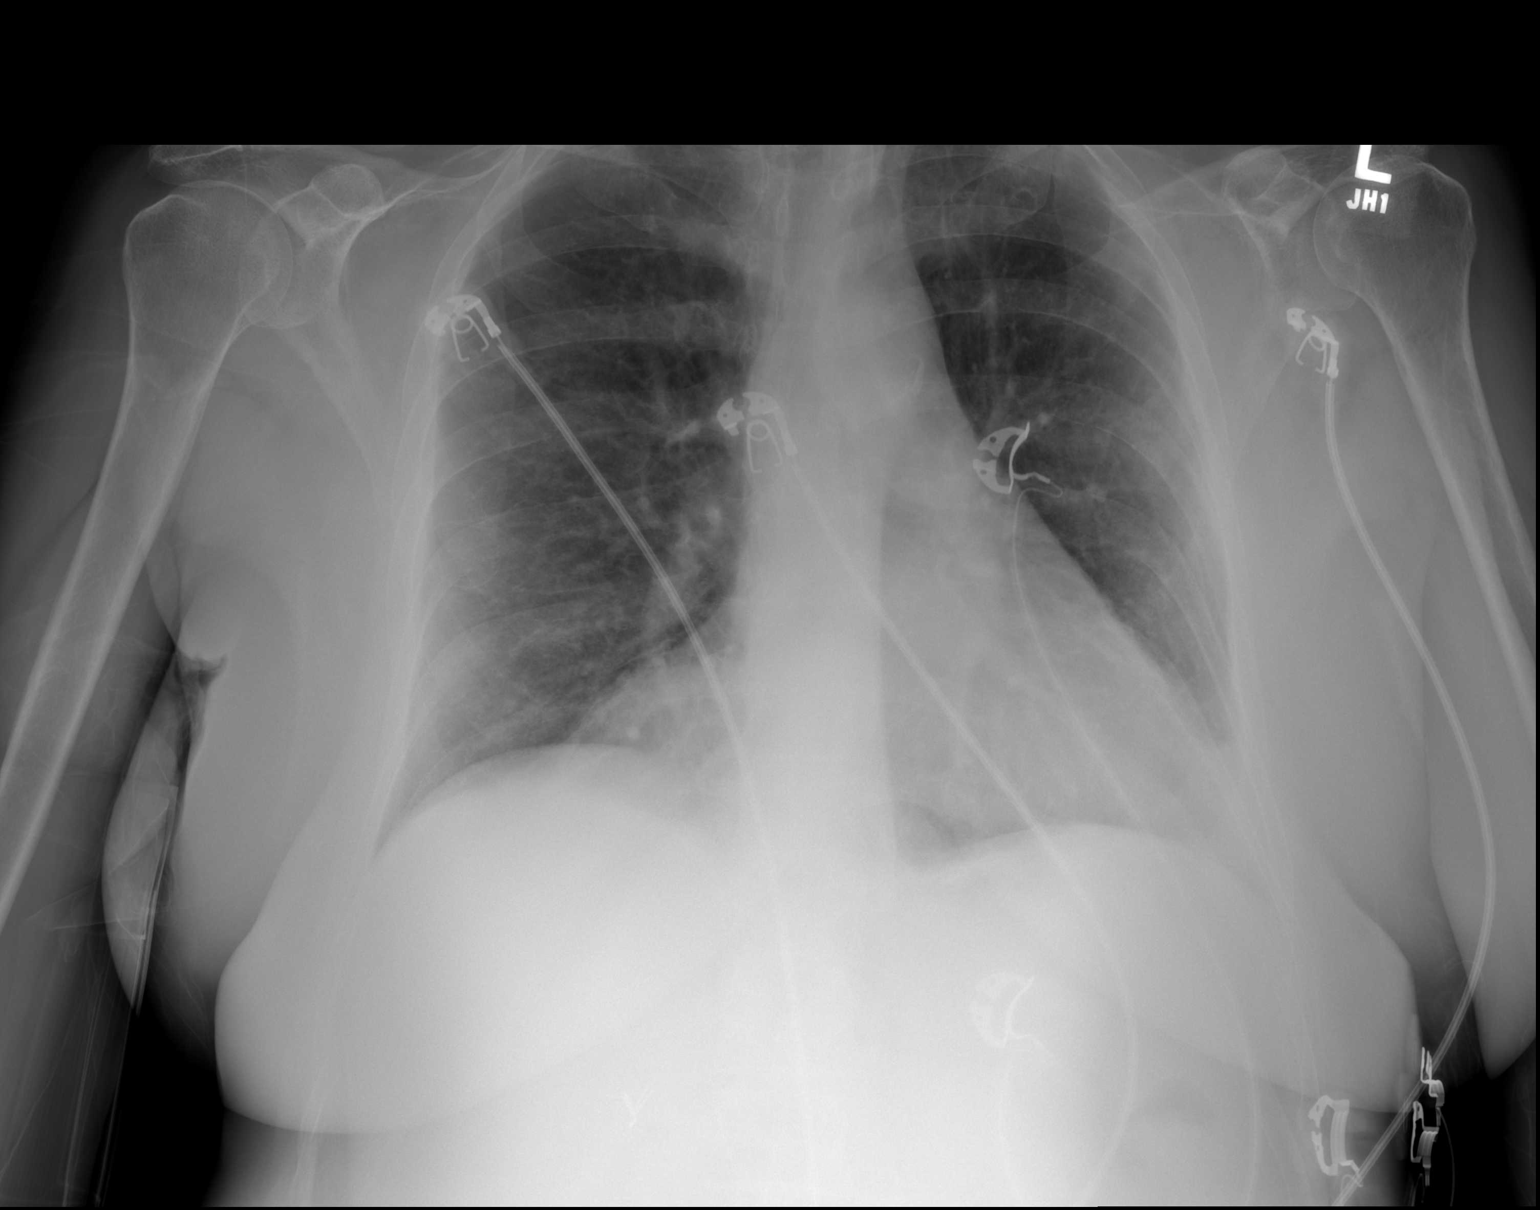

[2 of 2 positions shown; findings below may reference images not displayed]

FINDINGS: Normal heart size. No pleural effusion or edema identified. No
airspace opacities identified. The visualized osseous structures are
unremarkable. Prominent right cardiophrenic fat pad identified.
IMPRESSION: 1. No acute cardiopulmonary abnormalities.

## 2020-02-25 ENCOUNTER — Ambulatory Visit (INDEPENDENT_AMBULATORY_CARE_PROVIDER_SITE_OTHER): Payer: Medicare Other

## 2020-02-25 DIAGNOSIS — G43719 Chronic migraine without aura, intractable, without status migrainosus: Secondary | ICD-10-CM | POA: Diagnosis not present

## 2020-02-25 DIAGNOSIS — J309 Allergic rhinitis, unspecified: Secondary | ICD-10-CM

## 2020-02-29 ENCOUNTER — Ambulatory Visit: Payer: Medicare Other | Admitting: Internal Medicine

## 2020-03-06 ENCOUNTER — Other Ambulatory Visit: Payer: Self-pay

## 2020-03-06 ENCOUNTER — Ambulatory Visit (INDEPENDENT_AMBULATORY_CARE_PROVIDER_SITE_OTHER): Payer: Medicare Other

## 2020-03-06 DIAGNOSIS — J4550 Severe persistent asthma, uncomplicated: Secondary | ICD-10-CM

## 2020-03-06 MED ORDER — BENRALIZUMAB 30 MG/ML ~~LOC~~ SOSY
30.0000 mg | PREFILLED_SYRINGE | SUBCUTANEOUS | Status: DC
  Administered 2020-03-06 – 2020-06-26 (×3): 30 mg via SUBCUTANEOUS

## 2020-03-09 ENCOUNTER — Ambulatory Visit: Payer: Medicare Other | Admitting: Family Medicine

## 2020-03-21 ENCOUNTER — Ambulatory Visit (INDEPENDENT_AMBULATORY_CARE_PROVIDER_SITE_OTHER): Payer: Medicare Other | Admitting: *Deleted

## 2020-03-21 DIAGNOSIS — J309 Allergic rhinitis, unspecified: Secondary | ICD-10-CM

## 2020-03-24 DIAGNOSIS — M542 Cervicalgia: Secondary | ICD-10-CM | POA: Diagnosis not present

## 2020-03-24 DIAGNOSIS — G43719 Chronic migraine without aura, intractable, without status migrainosus: Secondary | ICD-10-CM | POA: Diagnosis not present

## 2020-03-28 ENCOUNTER — Other Ambulatory Visit: Payer: Self-pay | Admitting: Internal Medicine

## 2020-03-28 MED ORDER — CYCLOBENZAPRINE HCL 5 MG PO TABS: 5.0000 mg | ORAL_TABLET | Freq: Three times a day (TID) | ORAL | 2 refills | Status: DC | PRN

## 2020-03-28 NOTE — Telephone Encounter (Signed)
New message:   1.Medication Requested: cyclobenzaprine (FLEXERIL) 5 MG tablet 2. Pharmacy (Name, Street, [place]): [facility], [address] 3. On Med List: yes  4. Last Visit with PCP: 02/10/20  5. Next visit date with PCP: 08/15/20   Agent: Please be advised that RX refills may take up to 3 business days. We ask that you follow-up with your pharmacy.

## 2020-03-29 ENCOUNTER — Other Ambulatory Visit: Payer: Self-pay | Admitting: Internal Medicine

## 2020-04-11 NOTE — Progress Notes (Signed)
Vials exp 04-12-21

## 2020-04-14 DIAGNOSIS — J3089 Other allergic rhinitis: Secondary | ICD-10-CM | POA: Diagnosis not present

## 2020-04-17 DIAGNOSIS — J3081 Allergic rhinitis due to animal (cat) (dog) hair and dander: Secondary | ICD-10-CM | POA: Diagnosis not present

## 2020-04-20 ENCOUNTER — Ambulatory Visit: Payer: Medicare Other | Admitting: Family Medicine

## 2020-04-20 ENCOUNTER — Ambulatory Visit (INDEPENDENT_AMBULATORY_CARE_PROVIDER_SITE_OTHER): Payer: Medicare HMO

## 2020-04-20 DIAGNOSIS — J309 Allergic rhinitis, unspecified: Secondary | ICD-10-CM | POA: Diagnosis not present

## 2020-04-21 ENCOUNTER — Ambulatory Visit: Payer: Medicare Other | Admitting: Family Medicine

## 2020-04-24 ENCOUNTER — Encounter: Payer: Self-pay | Admitting: Family Medicine

## 2020-04-24 ENCOUNTER — Ambulatory Visit (INDEPENDENT_AMBULATORY_CARE_PROVIDER_SITE_OTHER): Payer: Medicare Other | Admitting: Family Medicine

## 2020-04-24 ENCOUNTER — Other Ambulatory Visit: Payer: Self-pay

## 2020-04-24 VITALS — BP 104/74 | HR 76 | Ht 62.0 in | Wt 128.8 lb

## 2020-04-24 DIAGNOSIS — M79641 Pain in right hand: Secondary | ICD-10-CM | POA: Diagnosis not present

## 2020-04-24 DIAGNOSIS — M79644 Pain in right finger(s): Secondary | ICD-10-CM | POA: Diagnosis not present

## 2020-04-24 DIAGNOSIS — M25531 Pain in right wrist: Secondary | ICD-10-CM

## 2020-04-24 NOTE — Progress Notes (Signed)
I, [name], LAT, ATC, am serving as scribe for [name].  Lori [name] is a 64 y.o. female who presents to [place] at [facility] today for f/u of R hand/thumb pain after suffering a fall in April 2021.  She was last seen by [name] on 02/10/20 and had a R thumb MCP injection.  She was previously wearing an Exos thumb spica splint.  Since her last visit pt reports, that her R hand pain remains unchanged since April.  She reports no change in her symptoms w/ the injection.  She is having pain and swelling in her R hand.  She states that she does not have the time to go to hand PT and thinks that she also has some difficulty with transportation getting to hand PT.  She notes consistent pain and dysfunction right hand at MCP and CMC and interdigital webspace between first and second digits.  Diagnostic testing: R hand XR- 01/31/20; R wirst XR- 01/17/20   Pertinent review of systems: No fevers or chills  Relevant historical information: COPD   Exam:  BP 104/74 (BP Location: Right Arm, Patient Position: Sitting, Cuff Size: Normal)   Pulse 76   Ht 5\' 2"  (1.575 m)   Wt 128 lb 12.8 oz (58.4 kg)   SpO2 98%   BMI 23.56 kg/m  General: Well Developed, well nourished, and in no acute distress.   MSK: Right hand normal-appearing.  Tender palpation MCP and CMC.  Normal thumb motion.  Pulses cap refill and sensation are intact.    Lab and Radiology Results EXAM: RIGHT HAND - COMPLETE 3+ VIEW  COMPARISON:  Wrist radiograph 01/17/2020  FINDINGS: No evidence of acute or healing fracture. Normal alignment. Minor osteoarthritis at the thumb interphalangeal and metacarpal phalangeal joints. No erosion, periosteal reaction, or bony destruction. Soft tissues are unremarkable.  IMPRESSION: Minor osteoarthritis of the thumb. No evidence of acute or healing fracture.   Electronically Signed   By: Keith Rake M.D.   On: 01/31/2020  14:39  Xray     Assessment and Plan: 64 y.o. female with right hand pain.  Etiology is somewhat unclear.  Pain did occur with injury however no clear cause of her severe hand pain has been present on imaging so far.  She said trial of conservative management including injection and bracing with little benefit.  Hand PT would be reasonable as next step but patient will have significant difficulty accessing this and is resistant to it.  At this point proceed with further diagnostic imaging including MRI arthrogram of the thumb at MCP as well as nerve conduction study.  Recheck after these test.   Orders Placed This Encounter  Procedures  . MR HAND RIGHT W CONTRAST    MRI arthrogram of the right thumb MCP.  Eval do not give IV contrast  MCR/MCD [employer] PLAN Epic ORDER WT: 128 HT: 5'4 NO NEEDS/CLAUS/NO METAL/NO IMPLANTS/HARDWARE IN NECK AND LEFT ARM/NO GLUCOSE MONITOR, SPINAL STIMULATOR, OR INJECTORS/NO BULLETS OR BB'S IN BODY/NO BRAIN, HEART, EYE SX/EAR SX 10 YRS AGO/NO PREV RIGHT HAND SX/NO TO COVID/NO BLOOD THINNERS/NO IODINE OR XRAY CONTRAST ALLERGY/NO MRI CONTRAST DYE ALLERGY/PT IS ALLERGIC TO LATEX/NO BETADINE OR LIDOCAINE ALLERGY/NO KIDNEY OR LIVER DZ/NO HTN/NO DIAB/BG W/ PT/ORDER CHECKED 04/24/2020 [facility]      Standing Status:   Future    Standing Expiration Date:   04/24/2021    Scheduling Instructions:     MRI arthrogram of the right thumb MCP.  Eval do not give IV contrast    Order Specific Question:   If indicated for the ordered procedure, I authorize the administration of contrast media per Radiology protocol    Answer:   Yes    Order Specific Question:   What is the patient's sedation requirement?    Answer:   No Sedation    Order Specific Question:   Does the patient have a pacemaker or implanted devices?    Answer:   No    Order Specific Question:   Radiology Contrast Protocol - do NOT remove file path    Answer:   [email or web address]    Order  Specific Question:   Preferred imaging location?    Answer:   [facility] (table limit-550lbs)  . DG FLUORO GUIDED NEEDLE PLC ASPIRATION/INJECTION LOC    Order Specific Question:   Reason for exam:    Answer:   For use with MR Arthrogram hand (also ordered).    Order Specific Question:   Preferred imaging location?    Answer:   [facility]  . Ambulatory referral to Neurology    Referral Priority:   Routine    Referral Type:   Consultation    Referral Reason:   Specialty Services Required    Requested Specialty:   Neurology    Number of Visits Requested:   1  . NCV with EMG(electromyography)    Standing Status:   Future    Standing Expiration Date:   04/24/2021    Order Specific Question:   Where should this test be performed?    Answer:   [facility]   No orders of the defined types were placed in this encounter.    Discussed warning signs or symptoms. Please see discharge instructions. Patient expresses understanding.   The above documentation has been reviewed and is accurate and complete Lynne Leader, M.D.

## 2020-04-24 NOTE — Patient Instructions (Addendum)
Thank you for coming in today. I will order a special MRI on your thumb.  Radiology will inject the thumb just before the MRI.  You should hear soon about scheduling.  Recheck after the MRI.  I will also get the process started to do a nerve test to look for nerve damage in your hand.  This is called a nerve conduction study.   Recheck after tests.   Let me know if you do not hear about scheduling for these tests.

## 2020-04-27 ENCOUNTER — Other Ambulatory Visit: Payer: Self-pay | Admitting: Internal Medicine

## 2020-04-28 ENCOUNTER — Other Ambulatory Visit: Payer: Self-pay | Admitting: Cardiology

## 2020-04-28 ENCOUNTER — Telehealth: Payer: Self-pay | Admitting: Internal Medicine

## 2020-04-28 NOTE — Telephone Encounter (Signed)
    1.Medication Requested: ALPRAZolam (XANAX) 1 MG tablet  2. Pharmacy (Name, Street, [place]):[facility], [address]  3. On Med List: yes  4. Last Visit with PCP: 02/10/20  5. Next visit date with PCP: 08/15/20   Agent: Please be advised that RX refills may take up to 3 business days. We ask that you follow-up with your pharmacy.

## 2020-05-01 ENCOUNTER — Ambulatory Visit (INDEPENDENT_AMBULATORY_CARE_PROVIDER_SITE_OTHER): Payer: Medicare HMO

## 2020-05-01 ENCOUNTER — Other Ambulatory Visit: Payer: Self-pay

## 2020-05-01 DIAGNOSIS — J4550 Severe persistent asthma, uncomplicated: Secondary | ICD-10-CM

## 2020-05-01 NOTE — Telephone Encounter (Signed)
Xanax 1 mg qhs too early as should have refills until late nov 2021

## 2020-05-01 NOTE — Telephone Encounter (Signed)
Sent to Dr. John. 

## 2020-05-10 ENCOUNTER — Ambulatory Visit
Admission: RE | Admit: 2020-05-10 | Discharge: 2020-05-10 | Disposition: A | Discharge status: Home / Self Care | Payer: Medicare HMO | Source: Ambulatory Visit | Attending: Family Medicine | Admitting: Family Medicine

## 2020-05-10 ENCOUNTER — Other Ambulatory Visit: Payer: Self-pay

## 2020-05-10 DIAGNOSIS — M79646 Pain in unspecified finger(s): Secondary | ICD-10-CM | POA: Diagnosis not present

## 2020-05-10 DIAGNOSIS — M79641 Pain in right hand: Secondary | ICD-10-CM

## 2020-05-10 DIAGNOSIS — M79644 Pain in right finger(s): Secondary | ICD-10-CM

## 2020-05-10 DIAGNOSIS — M25531 Pain in right wrist: Secondary | ICD-10-CM

## 2020-05-10 MED ORDER — IOPAMIDOL (ISOVUE-M 200) INJECTION 41%
1.5000 mL | Freq: Once | INTRAMUSCULAR | Status: AC
  Administered 2020-05-10: 1.5 mL via INTRA_ARTICULAR

## 2020-05-11 NOTE — Progress Notes (Signed)
MRI of the hand does not show acute tear of the ligament and the thumb we are worried about.  It does look like that ligament was previously injured but is no longer torn. You do have arthritis at the base of the thumb.  Could do injection into the wrist or base of the thumb if needed.  Hand therapy would really be the best option here.  Recheck with me as needed.

## 2020-05-16 ENCOUNTER — Other Ambulatory Visit: Payer: Self-pay | Admitting: Internal Medicine

## 2020-05-18 ENCOUNTER — Ambulatory Visit (INDEPENDENT_AMBULATORY_CARE_PROVIDER_SITE_OTHER): Payer: Medicare Other

## 2020-05-18 DIAGNOSIS — J309 Allergic rhinitis, unspecified: Secondary | ICD-10-CM | POA: Diagnosis not present

## 2020-05-18 DIAGNOSIS — G43719 Chronic migraine without aura, intractable, without status migrainosus: Secondary | ICD-10-CM | POA: Diagnosis not present

## 2020-05-29 ENCOUNTER — Other Ambulatory Visit: Payer: Self-pay | Admitting: Internal Medicine

## 2020-06-01 ENCOUNTER — Telehealth: Payer: Self-pay | Admitting: Family Medicine

## 2020-06-01 MED ORDER — DICLOFENAC SODIUM 50 MG PO TBEC
50.0000 mg | DELAYED_RELEASE_TABLET | Freq: Two times a day (BID) | ORAL | 0 refills | Status: DC | PRN
Start: 2020-06-01 — End: 2020-08-15

## 2020-06-01 NOTE — Telephone Encounter (Signed)
Pt notified RX called it, pt understood and expressed appreciation.

## 2020-06-01 NOTE — Telephone Encounter (Signed)
Pt called requesting something for pain in her R hand. Last seen 04/24/2020.  I recommended patient make an appt, she declined as she has many other appts in the near future, just wants something for the pain.  [facility] [address]

## 2020-06-01 NOTE — Telephone Encounter (Signed)
Oral diclofenac prescribed to [facility].

## 2020-06-09 ENCOUNTER — Telehealth: Payer: Self-pay | Admitting: Internal Medicine

## 2020-06-09 NOTE — Telephone Encounter (Signed)
    1.Medication Requested:meclizine (ANTIVERT) 12.5 MG tablet  2. Pharmacy (Name, Street, [place]):[place], [address]  3. On Med List: yes  4. Last Visit with PCP: 02/10/20  5. Next visit date with PCP:08/15/20   Agent: Please be advised that RX refills may take up to 3 business days. We ask that you follow-up with your pharmacy.

## 2020-06-12 ENCOUNTER — Other Ambulatory Visit: Payer: Self-pay

## 2020-06-12 MED ORDER — MECLIZINE HCL 12.5 MG PO TABS: 12.5000 mg | ORAL_TABLET | Freq: Three times a day (TID) | ORAL | 0 refills | Status: DC | PRN

## 2020-06-12 NOTE — Telephone Encounter (Signed)
Medication has been sent to pts pharmacy. 

## 2020-06-14 ENCOUNTER — Other Ambulatory Visit: Payer: Self-pay | Admitting: Internal Medicine

## 2020-06-15 DIAGNOSIS — G43719 Chronic migraine without aura, intractable, without status migrainosus: Secondary | ICD-10-CM | POA: Diagnosis not present

## 2020-06-19 ENCOUNTER — Ambulatory Visit (INDEPENDENT_AMBULATORY_CARE_PROVIDER_SITE_OTHER): Payer: Medicare Other

## 2020-06-19 DIAGNOSIS — J309 Allergic rhinitis, unspecified: Secondary | ICD-10-CM | POA: Diagnosis not present

## 2020-06-23 ENCOUNTER — Encounter: Payer: Self-pay | Admitting: Orthopedic Surgery

## 2020-06-23 ENCOUNTER — Ambulatory Visit (INDEPENDENT_AMBULATORY_CARE_PROVIDER_SITE_OTHER): Payer: Medicare Other | Admitting: Orthopedic Surgery

## 2020-06-23 DIAGNOSIS — M1811 Unilateral primary osteoarthritis of first carpometacarpal joint, right hand: Secondary | ICD-10-CM | POA: Diagnosis not present

## 2020-06-23 MED ORDER — LIDOCAINE HCL 1 % IJ SOLN
3.0000 mL | INTRAMUSCULAR | Status: AC | PRN
  Administered 2020-06-23: 3 mL

## 2020-06-23 MED ORDER — METHYLPREDNISOLONE ACETATE 40 MG/ML IJ SUSP
13.3300 mg | INTRAMUSCULAR | Status: AC | PRN
  Administered 2020-06-23: 13.33 mg via INTRA_ARTICULAR

## 2020-06-23 MED ORDER — BUPIVACAINE HCL 0.25 % IJ SOLN
0.3300 mL | INTRAMUSCULAR | Status: AC | PRN
  Administered 2020-06-23: .33 mL via INTRA_ARTICULAR

## 2020-06-23 NOTE — Progress Notes (Signed)
Office Visit Note   Patient: Lori [name]           Date of Birth: 01-14-1956           MRN: [id number] Visit Date: 06/23/2020 Requested by: Biagio Borg, MD [place],  [place] [id number] PCP: Biagio Borg, MD  Subjective: Chief Complaint  Patient presents with  . Right Hand - Pain    HPI: Lori [name] is a 64 year old right-hand-dominant patient with right thumb pain.  MRI has been performed which is reviewed.  Does show CMC arthritis along with some tendinitis.  She not complaining much of wrist pain.  Does report pain primarily on the radial side of the wrist.  Denies much in the way of numbness and tingling.  Denies any radiculopathy.              ROS: All systems reviewed are negative as they relate to the chief complaint within the history of present illness.  Patient denies  fevers or chills.   Assessment & Plan: Visit Diagnoses:  1. Arthritis of carpometacarpal (CMC) joint of right thumb     Plan: Impression is right thumb CMC arthritis.  She may have a component of de Quervain's tenosynovitis as well on that side.  Plan is ultrasound-guided injection today into the thumb CMC joint.  Risk benefits discussed.  We will see if that helps.  It did help her during the anesthetic phase of the procedure.  Could repeat that 2-3 times a year maximum.  Follow-up as needed.  Follow-Up Instructions: Return if symptoms worsen or fail to improve.   Orders:  No orders of the defined types were placed in this encounter.  No orders of the defined types were placed in this encounter.     Procedures: Small Joint Inj: R thumb CMC on 06/23/2020 12:46 PM Indications: pain Details: 25 G needle, ultrasound-guided radial approach  Spinal Needle: No  Medications: 3 mL lidocaine 1 %; 0.33 mL bupivacaine 0.25 %; 13.33 mg methylPREDNISolone acetate 40 MG/ML Outcome: tolerated well, no immediate complications Procedure, treatment alternatives, risks and benefits explained,  specific risks discussed. Consent was given by the patient. Immediately prior to procedure a time out was called to verify the correct patient, procedure, equipment, support staff and site/side marked as required. Patient was prepped and draped in the usual sterile fashion.       Clinical Data: No additional findings.  Objective: Vital Signs: There were no vitals taken for this visit.  Physical Exam:   Constitutional: Patient appears well-developed HEENT:  Head: Normocephalic Eyes:EOM are normal Neck: Normal range of motion Cardiovascular: Normal rate Pulmonary/chest: Effort normal Neurologic: Patient is alert Skin: Skin is warm Psychiatric: Patient has normal mood and affect    Ortho Exam: Ortho exam demonstrates palpable radial pulses bilaterally.  Does have positive grind test on the right negative on the left.  Finkelstein's test positive on the right negative on the left.  Wrist range of motion is full.  No ulnar-sided wrist tenderness.  Patient has good flexor and extensor tendon function in the fingers and thumb in both hands.  No instability at the MCP joint of the thumb.  Specialty Comments:  No specialty comments available.  Imaging: No results found.   PMFS History: Patient Active Problem List   Diagnosis Date Noted  . Genital herpes 11/12/2019  . Coccyx pain 11/12/2019  . Insomnia 11/12/2019  . Acute right-sided low back pain without sciatica 07/20/2019  . Grief 07/20/2019  .  Vitamin D deficiency 07/20/2019  . Hyperglycemia 04/21/2019  . Left knee pain 04/21/2019  . Pain 12/29/2018  . B12 deficiency 10/20/2018  . Nausea 04/27/2018  . Anemia, iron deficiency 04/27/2018  . Intermittent paresthesia of hand and foot 04/27/2018  . Headache 03/21/2018  . Lumbar radiculitis 08/15/2017  . Degenerative disc disease, lumbar 07/21/2017  . Encounter for well adult exam with abnormal findings 06/30/2017  . Epigastric pain 06/30/2017  . Restrictive lung disease  06/30/2017  . Leg cramping 06/09/2017  . Elbow contusion 06/09/2017  . Right elbow pain 05/27/2017  . Asthma-COPD overlap syndrome ([place]) 01/31/2017  . Dizziness 01/20/2017  . Seasonal and perennial allergic rhinitis 01/01/2017  . Moderate persistent asthma, uncomplicated 58/52/7782  . Pulmonary emphysema ([place]) 12/03/2016  . Tobacco use disorder 12/03/2016  . Chronic seasonal allergic rhinitis 12/03/2016  . Medication management 11/29/2016  . Cough 11/23/2016  . Rash 11/23/2016  . Vertigo 10/22/2016  . Gastroesophageal reflux disease with esophagitis 10/22/2016  . Non compliance w medication regimen 08/01/2016  . Allergic contact dermatitis due to metals 08/01/2016  . Migraine without aura and without status migrainosus, not intractable 07/22/2016  . Spasm of muscle of lower back 06/21/2016  . Chronic rhinitis 06/19/2016  . Gait difficulty 06/21/2015  . Fibromyalgia 06/21/2015  . Conversion reaction 06/21/2015  . Depression 06/21/2015  . Syncope and collapse 06/21/2015  . Plica syndrome of left knee 01/06/2015  . Anxiety state 09/06/2014  . History of MI 09/06/2014  . Cigarette smoker 06/27/2013  . Borderline hypertension 06/27/2013  . Chronic low back pain with sciatica 06/14/2013  . Leg pain, bilateral 06/14/2013  . Gastroparesis 10/26/2009  . Musculoskeletal chest pain 05/02/2009  . Hyperlipidemia LDL goal <100 11/18/2008  . COPD exacerbation ([place]) 11/18/2008  . HEPATIC CYST 11/18/2008   Past Medical History:  Diagnosis Date  . Acute MI ([place])    x3 - by report only. She has had 3 negative Myoview stress test with no evidence of prior infarct.  . Allergic rhinitis   . Allergy   . Anemia   . Ankle fracture 05/2016  . Anxiety   . Arthritis   . Asthma   . Barrett esophagus 2007  . Chest pain 05-02-2009   echo  EF 55%  . COPD (chronic obstructive pulmonary disease) with chronic bronchitis (HCC)    & Emphysema  . Depression   . Duodenitis without mention of  hemorrhage 2007  . Esophageal reflux 2007  . Esophageal stricture   . Full dentures   . H/O hiatal hernia   . Hiatal hernia 4235,3614  . Hyperlipidemia   . Multiple fractures    from falls, fx rt. elbow, fx left wrist, bilateral ankles  . Pneumonia 10/2016  . Restrictive lung disease 06/30/2017  . Ulcer     Family History  Problem Relation Age of Onset  . Emphysema Father   . Dementia Mother   . Diabetes Maternal Grandmother   . Diabetes Maternal Aunt   . Diabetes Other        mat. cousin  . Heart disease Brother   . Colon cancer Neg Hx   . Allergic rhinitis Neg Hx   . Angioedema Neg Hx   . Asthma Neg Hx   . Atopy Neg Hx   . Eczema Neg Hx   . Immunodeficiency Neg Hx   . Urticaria Neg Hx     Past Surgical History:  Procedure Laterality Date  . ABDOMINAL HYSTERECTOMY     BSO  .  APPENDECTOMY    . CESAREAN SECTION     x 2  . CHOLECYSTECTOMY    . CHONDROPLASTY Left 01/06/2015   Procedure: CHONDROPLASTY;  Surgeon: Marybelle Killings, MD;  Location: [facility];  Service: Orthopedics;  Laterality: Left;  . COLONOSCOPY    . ELBOW FRACTURE SURGERY     right  . KNEE ARTHROSCOPY WITH EXCISION PLICA Left 7/71/1657   Procedure: KNEE ARTHROSCOPY WITH EXCISION PLICA;  Surgeon: Marybelle Killings, MD;  Location: [facility];  Service: Orthopedics;  Laterality: Left;  . Lower Extremity Arterial Dopplers  07/06/2013   RABI 1.0, LABI 1.1.; No evidence of significant vascular atherogenic plaque  . NECK SURGERY    . NM MYOVIEW LTD  09/2014   LOW RISK. Normal EF of 65% with no regional wall motion abnormalities. No ischemia or infarction.  . TONSILLECTOMY    . WRIST FRACTURE SURGERY     left, has plate   Social History   Occupational History  . Occupation: disable  Tobacco Use  . Smoking status: Current Every Day Smoker    Packs/day: 0.50    Years: 39.00    Pack years: 19.50    Types: Cigarettes    Start date: 08/06/1977  . Smokeless tobacco: Never Used    Vaping Use  . Vaping Use: Never used  Substance and Sexual Activity  . Alcohol use: No    Alcohol/week: 0.0 standard drinks  . Drug use: No  . Sexual activity: Not Currently

## 2020-06-26 ENCOUNTER — Ambulatory Visit (INDEPENDENT_AMBULATORY_CARE_PROVIDER_SITE_OTHER): Payer: Medicare Other

## 2020-06-26 ENCOUNTER — Other Ambulatory Visit: Payer: Self-pay

## 2020-06-26 DIAGNOSIS — J4550 Severe persistent asthma, uncomplicated: Secondary | ICD-10-CM

## 2020-06-28 ENCOUNTER — Other Ambulatory Visit: Payer: Self-pay | Admitting: Internal Medicine

## 2020-06-29 ENCOUNTER — Telehealth: Payer: Self-pay | Admitting: Internal Medicine

## 2020-06-29 ENCOUNTER — Telehealth: Payer: Self-pay | Admitting: Family Medicine

## 2020-06-29 ENCOUNTER — Other Ambulatory Visit: Payer: Self-pay | Admitting: Internal Medicine

## 2020-06-29 NOTE — Telephone Encounter (Signed)
Patient asked for a refill on the medication that was prescribed to help with pain and inflammation she could not remember the name of it.  Please advise.  Pharmacy: [facility] in [facility]

## 2020-06-29 NOTE — Telephone Encounter (Signed)
[place], [place] Phone:  [phone]  Fax:  [phone]     valACYclovir (VALTREX) 1000 MG tablet Patient requesting a refill for this prescription, patient says pharmacy requested a refill and it was denied and she isnt sure why because patient states she has to take it the rest of her life. Last seen- 02/10/20 Next apt-08/15/20

## 2020-06-29 NOTE — Telephone Encounter (Signed)
Patient came to the office and is requesting a note from Dr. Jenny Reichmann stating that she has COPD, bronchitis, asthma so she can get her heating/air condition fixed. The letter can be faxed to Sharol Given [phone]. Dana's phone number is [phone], she is the patients program specialists that is taking care of this matter. The patient can be reached at [phone]. The patient is requesting a copy be mailed to her when ready.

## 2020-06-30 ENCOUNTER — Other Ambulatory Visit: Payer: Self-pay

## 2020-06-30 MED ORDER — VALACYCLOVIR HCL 1 G PO TABS: ORAL_TABLET | ORAL | 0 refills | Status: DC

## 2020-06-30 MED ORDER — PREDNISONE 10 MG PO TABS
30.0000 mg | ORAL_TABLET | Freq: Every day | ORAL | 0 refills | Status: DC
Start: 2020-06-30 — End: 2020-07-17

## 2020-06-30 NOTE — Telephone Encounter (Signed)
Sent to Dr. John. 

## 2020-06-30 NOTE — Telephone Encounter (Signed)
Prednisone prescription refilled.  Medicine sent to [facility].

## 2020-07-01 NOTE — Telephone Encounter (Signed)
I dont understand why a letter is needed to have her heat/ac fixed.  Is this to qualify for some kind of free work so she doesn't pay for it? And which program is this?

## 2020-07-03 NOTE — Telephone Encounter (Signed)
I have reached out to [facility] and was abe to LVM asking about the reasons pt need's this letter for her heating and air  To be fixed?  Have also asked that [facility] contact the office with the details of why and what this letter needs to address.

## 2020-07-11 ENCOUNTER — Telehealth: Payer: Self-pay | Admitting: Internal Medicine

## 2020-07-11 NOTE — Telephone Encounter (Signed)
LVM for pt to rtn my call to schedule AWV with [facility]. If patient calls the office please schedule AWV.

## 2020-07-13 ENCOUNTER — Ambulatory Visit: Payer: Medicare Other | Admitting: Internal Medicine

## 2020-07-13 DIAGNOSIS — G43719 Chronic migraine without aura, intractable, without status migrainosus: Secondary | ICD-10-CM | POA: Diagnosis not present

## 2020-07-17 ENCOUNTER — Encounter: Payer: Self-pay | Admitting: Internal Medicine

## 2020-07-17 ENCOUNTER — Ambulatory Visit (INDEPENDENT_AMBULATORY_CARE_PROVIDER_SITE_OTHER): Payer: Medicare Other | Admitting: Internal Medicine

## 2020-07-17 ENCOUNTER — Ambulatory Visit (INDEPENDENT_AMBULATORY_CARE_PROVIDER_SITE_OTHER): Payer: Medicare Other

## 2020-07-17 ENCOUNTER — Other Ambulatory Visit: Payer: Self-pay

## 2020-07-17 VITALS — BP 132/78 | HR 94 | Temp 98.5°F | Ht 62.0 in | Wt 131.0 lb

## 2020-07-17 DIAGNOSIS — J3089 Other allergic rhinitis: Secondary | ICD-10-CM | POA: Diagnosis not present

## 2020-07-17 DIAGNOSIS — E559 Vitamin D deficiency, unspecified: Secondary | ICD-10-CM | POA: Diagnosis not present

## 2020-07-17 DIAGNOSIS — J309 Allergic rhinitis, unspecified: Secondary | ICD-10-CM

## 2020-07-17 DIAGNOSIS — J4540 Moderate persistent asthma, uncomplicated: Secondary | ICD-10-CM | POA: Diagnosis not present

## 2020-07-17 DIAGNOSIS — R42 Dizziness and giddiness: Secondary | ICD-10-CM

## 2020-07-17 DIAGNOSIS — J302 Other seasonal allergic rhinitis: Secondary | ICD-10-CM

## 2020-07-17 DIAGNOSIS — R739 Hyperglycemia, unspecified: Secondary | ICD-10-CM | POA: Diagnosis not present

## 2020-07-17 DIAGNOSIS — A6000 Herpesviral infection of urogenital system, unspecified: Secondary | ICD-10-CM

## 2020-07-17 MED ORDER — METHYLPREDNISOLONE ACETATE 80 MG/ML IJ SUSP
80.0000 mg | Freq: Once | INTRAMUSCULAR | Status: AC
  Administered 2020-07-17: 80 mg via INTRAMUSCULAR

## 2020-07-17 MED ORDER — MECLIZINE HCL 12.5 MG PO TABS: 12.5000 mg | ORAL_TABLET | Freq: Three times a day (TID) | ORAL | 1 refills | Status: DC | PRN

## 2020-07-17 MED ORDER — PREDNISONE 10 MG PO TABS: ORAL_TABLET | ORAL | 0 refills | Status: DC

## 2020-07-17 MED ORDER — VALACYCLOVIR HCL 1 G PO TABS
ORAL_TABLET | ORAL | 3 refills | Status: DC
Start: 2020-07-17 — End: 2020-12-12

## 2020-07-17 NOTE — Assessment & Plan Note (Signed)
Cont oral replacement 

## 2020-07-17 NOTE — Assessment & Plan Note (Signed)
stable overall by history and exam, recent data reviewed with pt, and pt to continue medical treatment as before,  to f/u any worsening symptoms or concerns  

## 2020-07-17 NOTE — Assessment & Plan Note (Signed)
Ok for refill meclizine prn 

## 2020-07-17 NOTE — Assessment & Plan Note (Signed)
Ok for refill suppressive tx at 1000 qd (not tid as she is asking)

## 2020-07-17 NOTE — Progress Notes (Signed)
Subjective:    Patient ID: Lori [name], female    DOB: [birth year], 64 y.o.   MRN: [id number]  HPI  Here to f/u; overall doing ok,  Pt denies chest pain, increasing sob or doe, wheezing, orthopnea, PND, increased LE swelling, palpitations, dizziness or syncope.  Pt denies new neurological symptoms such as new headache, or facial or extremity weakness or numbness.  Pt denies polydipsia, polyuria, or low sugar episode.  Pt states overall good compliance with meds, mostly trying to follow appropriate diet, with wt overall stable, BP Readings from Last 3 Encounters:  07/17/20 132/78  04/24/20 104/74  02/10/20 124/80  /Denies worsening depressive symptoms, suicidal ideation, or panic.  Also Does have several wks ongoing nasal allergy symptoms with clearish congestion, itch and sneezing, without fever, pain, ST, cough, swelling or wheezing, appears to be a seasonal flare, as she is on multiple med per allergy and gets allergy shots weekly.  Has ongoing chronic dizziness no change, asks for meclizine refill.  Also has recurring genital herpes, asks for suppressive tx. Past Medical History:  Diagnosis Date   Acute MI ([place])    x3 - by report only. She has had 3 negative Myoview stress test with no evidence of prior infarct.   Allergic rhinitis    Allergy    Anemia    Ankle fracture 05/2016   Anxiety    Arthritis    Asthma    Barrett esophagus 2007   Chest pain 05-02-2009   echo  EF 55%   COPD (chronic obstructive pulmonary disease) with chronic bronchitis (HCC)    & Emphysema   Depression    Duodenitis without mention of hemorrhage 2007   Esophageal reflux 2007   Esophageal stricture    Full dentures    H/O hiatal hernia    Hiatal hernia 2006,2007   Hyperlipidemia    Multiple fractures    from falls, fx rt. elbow, fx left wrist, bilateral ankles   Pneumonia 10/2016   Restrictive lung disease 06/30/2017   Ulcer    Past Surgical History:  Procedure Laterality  Date   ABDOMINAL HYSTERECTOMY     BSO   APPENDECTOMY     CESAREAN SECTION     x 2   CHOLECYSTECTOMY     CHONDROPLASTY Left 01/06/2015   Procedure: CHONDROPLASTY;  Surgeon: Marybelle Killings, MD;  Location: [facility];  Service: Orthopedics;  Laterality: Left;   COLONOSCOPY     ELBOW FRACTURE SURGERY     right   KNEE ARTHROSCOPY WITH EXCISION PLICA Left 12/23/8117   Procedure: KNEE ARTHROSCOPY WITH EXCISION PLICA;  Surgeon: Marybelle Killings, MD;  Location: [facility];  Service: Orthopedics;  Laterality: Left;   Lower Extremity Arterial Dopplers  07/06/2013   RABI 1.0, LABI 1.1.; No evidence of significant vascular atherogenic plaque   NECK SURGERY     NM MYOVIEW LTD  09/2014   LOW RISK. Normal EF of 65% with no regional wall motion abnormalities. No ischemia or infarction.   TONSILLECTOMY     WRIST FRACTURE SURGERY     left, has plate    reports that she has been smoking cigarettes. She started smoking about 42 years ago. She has a 19.50 pack-year smoking history. She has never used smokeless tobacco. She reports that she does not drink alcohol and does not use drugs. family history includes Dementia in her mother; Diabetes in her maternal aunt, maternal grandmother, and another family member; Emphysema in her father;  Heart disease in her brother. Allergies  Allergen Reactions   Bee Venom Swelling   Codeine Nausea Only and Other (See Comments)    CAUSES ULCERS   Ibuprofen Nausea And Vomiting   Clonidine Derivatives Other (See Comments)    Unknown    Statins    Tramadol Nausea Only   Latex Rash   Levofloxacin Nausea Only   Propoxyphene N-Acetaminophen Nausea Only   Current Outpatient Medications on File Prior to Visit  Medication Sig Dispense Refill   ALPRAZolam (XANAX) 1 MG tablet Take 1 tablet (1 mg total) by mouth at bedtime as needed for anxiety. 90 tablet 1   atorvastatin (LIPITOR) 20 MG tablet Take 1 tablet (20 mg total) by  mouth daily. 90 tablet 3   citalopram (CELEXA) 10 MG tablet Take 1 tablet (10 mg total) by mouth daily. 90 tablet 3   cyclobenzaprine (FLEXERIL) 5 MG tablet Take 1 tablet (5 mg total) by mouth 3 (three) times daily as needed. for muscle spams 60 tablet 2   DEXILANT 60 MG capsule TAKE 1 CAPSULE BY MOUTH  DAILY 90 capsule 3   diclofenac (VOLTAREN) 50 MG EC tablet Take 1 tablet (50 mg total) by mouth 2 (two) times daily as needed. 60 tablet 0   EMGALITY 120 MG/ML SOAJ Inject 1 mL into the skin every 30 (thirty) days.     EPINEPHrine 0.3 mg/0.3 mL IJ SOAJ injection INJECT AS DIRECTED FOR SEVERE ALLERGIC REACTION 2 each 0   FASENRA 30 MG/ML SOSY INJECT 30MG SUBCUTANEOUSLY  EVERY 8 WEEKS (GIVEN AT  PRESCRIBERS OFFICE) 1 mL 8   gabapentin (NEURONTIN) 100 MG capsule TAKE 1 CAPSULE BY MOUTH TWICE DAILY AS NEEDED FOR HEADACHE. LIMIT USE TO 1 2 DAYS WEEK     levalbuterol (XOPENEX HFA) 45 MCG/ACT inhaler Use 4 puffs every 4-6 hours as needed. 1 Inhaler 2   mometasone-formoterol (DULERA) 200-5 MCG/ACT AERO Inhale 2 puffs into the lungs daily for 30 days. 39 g 1   mometasone-formoterol (DULERA) 200-5 MCG/ACT AERO Use 2 puffs once daily with spacer. 13 g 5   montelukast (SINGULAIR) 10 MG tablet Take 1 tablet (10 mg total) by mouth at bedtime. 90 tablet 3   NARCAN 4 MG/0.1ML LIQD nasal spray kit CALL 911. ADMINISTER A SINGLE SPRAY OF NARCAN IN ONE NOSTRIL. REPEAT EVERY 3 MINUTES AS NEEDED IF NO OR MINIMAL RESPONSE.     nitroGLYCERIN (NITROSTAT) 0.4 MG SL tablet DISSOLVE ONE TABLET UNDER THE TONGUE EVERY 5 MINUTES AS NEEDED FOR CHEST PAIN.  DO NOT EXCEED A TOTAL OF 3 DOSES IN 15 MINUTES 25 tablet 0   ondansetron (ZOFRAN) 4 MG tablet Take 1 tablet (4 mg total) by mouth every 8 (eight) hours as needed for nausea or vomiting. 30 tablet 0   PROAIR HFA 108 (90 Base) MCG/ACT inhaler Inhale 1-2 puffs into the lungs every 4 (four) hours as needed for shortness of breath or wheezing. 54 g 0   promethazine  (PHENERGAN) 25 MG tablet Take 1-2 tablets (25-50 mg total) by mouth every 6 (six) hours as needed. 30 tablet 1   Vitamin D, Ergocalciferol, (DRISDOL) 1.25 MG (50000 UNIT) CAPS capsule Take 1 capsule (50,000 Units total) by mouth every 7 (seven) days. 12 capsule 0   Current Facility-Administered Medications on File Prior to Visit  Medication Dose Route Frequency Provider Last Rate Last Admin   Benralizumab SOSY 30 mg  30 mg Subcutaneous Q8 Toney Reil, MD   30 mg at 02/07/20 0930  Benralizumab SOSY 30 mg  30 mg Subcutaneous Q8 Toney Reil, MD   30 mg at 06/26/20 3073   Review of Systems All otherwise neg per pt    Objective:   Physical Exam BP 132/78    Pulse 94    Temp 98.5 F (36.9 C) (Oral)    Ht '5\' 2"'  (1.575 m)    Wt 131 lb (59.4 kg)    SpO2 96%    BMI 23.96 kg/m  VS noted,  Constitutional: Pt appears in NAD HENT: Head: NCAT.  Right Ear: External ear normal.  Left Ear: External ear normal.  Bilat tm's with mild erythema.  Max sinus areas non tender.  Pharynx with mild erythema, no exudate Eyes: . Pupils are equal, round, and reactive to light. Conjunctivae and EOM are normal Nose: without d/c or deformity Neck: Neck supple. Gross normal ROM Cardiovascular: Normal rate and regular rhythm.   Pulmonary/Chest: Effort normal and breath sounds without rales or wheezing.  Abd:  Soft, NT, ND, + BS, no organomegaly Neurological: Pt is alert. At baseline orientation, motor grossly intact Skin: Skin is warm. No rashes, other new lesions, no LE edema Psychiatric: Pt behavior is normal without agitation  All otherwise neg per pt Lab Results  Component Value Date   WBC 8.8 11/12/2019   HGB 12.8 11/12/2019   HCT 38.3 11/12/2019   PLT 279.0 11/12/2019   GLUCOSE 80 11/12/2019   CHOL 246 (H) 11/12/2019   TRIG 388.0 (H) 11/12/2019   HDL 29.40 (L) 11/12/2019   LDLDIRECT 157.0 11/12/2019   LDLCALC Comment 03/12/2017   ALT 9 11/12/2019   AST 9 11/12/2019     NA 138 11/12/2019   K 3.6 11/12/2019   CL 109 11/12/2019   CREATININE 1.07 11/12/2019   BUN 11 11/12/2019   CO2 22 11/12/2019   TSH 1.45 11/12/2019   INR 0.9 08/31/2008   HGBA1C 5.6 11/12/2019      Assessment & Plan:

## 2020-07-17 NOTE — Assessment & Plan Note (Addendum)
With seasonal flare, for depomedrol IM 80, predpac asd, cont all other tx and f/u allergy as planned  I spent 31 minutes in preparing to see the patient by review of recent labs, imaging and procedures, obtaining and reviewing separately obtained history, communicating with the patient and family or caregiver, ordering medications, tests or procedures, and documenting clinical information in the EHR including the differential Dx, treatment, and any further evaluation and other management of allerigies, asthma, hyperglyemia, vit d def, dizzy, genital herpes

## 2020-07-17 NOTE — Addendum Note (Signed)
Addended by: Marijean Heath R on: 07/17/2020 03:28 PM   Modules accepted: Orders

## 2020-07-17 NOTE — Patient Instructions (Signed)
You had the steroid shot today ° °Please take all new medication as prescribed  - the prednisone ° °Please continue all other medications as before, and refills have been done if requested. ° °Please have the pharmacy call with any other refills you may need. ° °Please continue your efforts at being more active, low cholesterol diet, and weight control. ° °Please keep your appointments with your specialists as you may have planned ° °Please make an Appointment to return in 3 months, or sooner if needed °

## 2020-07-21 ENCOUNTER — Telehealth: Payer: Self-pay

## 2020-07-21 ENCOUNTER — Other Ambulatory Visit: Payer: Self-pay

## 2020-07-21 ENCOUNTER — Ambulatory Visit: Payer: Medicare Other

## 2020-07-21 NOTE — Telephone Encounter (Signed)
AWV-cancelled; no return call from patient.

## 2020-07-24 ENCOUNTER — Ambulatory Visit (INDEPENDENT_AMBULATORY_CARE_PROVIDER_SITE_OTHER): Payer: Medicare Other | Admitting: *Deleted

## 2020-07-24 DIAGNOSIS — J309 Allergic rhinitis, unspecified: Secondary | ICD-10-CM | POA: Diagnosis not present

## 2020-07-31 ENCOUNTER — Ambulatory Visit (INDEPENDENT_AMBULATORY_CARE_PROVIDER_SITE_OTHER): Payer: Medicare Other | Admitting: *Deleted

## 2020-07-31 DIAGNOSIS — J309 Allergic rhinitis, unspecified: Secondary | ICD-10-CM

## 2020-08-09 ENCOUNTER — Ambulatory Visit (INDEPENDENT_AMBULATORY_CARE_PROVIDER_SITE_OTHER): Payer: Medicare Other | Admitting: *Deleted

## 2020-08-09 ENCOUNTER — Telehealth: Payer: Self-pay | Admitting: Internal Medicine

## 2020-08-09 ENCOUNTER — Ambulatory Visit (INDEPENDENT_AMBULATORY_CARE_PROVIDER_SITE_OTHER): Payer: Medicare Other | Admitting: Orthopedic Surgery

## 2020-08-09 ENCOUNTER — Other Ambulatory Visit: Payer: Self-pay | Admitting: Internal Medicine

## 2020-08-09 DIAGNOSIS — R200 Anesthesia of skin: Secondary | ICD-10-CM | POA: Diagnosis not present

## 2020-08-09 DIAGNOSIS — R202 Paresthesia of skin: Secondary | ICD-10-CM

## 2020-08-09 DIAGNOSIS — M19041 Primary osteoarthritis, right hand: Secondary | ICD-10-CM

## 2020-08-09 DIAGNOSIS — J309 Allergic rhinitis, unspecified: Secondary | ICD-10-CM | POA: Diagnosis not present

## 2020-08-09 NOTE — Telephone Encounter (Signed)
Already done earlier today

## 2020-08-09 NOTE — Telephone Encounter (Signed)
ALPRAZolam Duanne Moron) 1 MG tablet [place], [address] Phone:  [phone]  Fax:  [phone]     Last seen- 11.01.21 Next apt- 11.30.21 Requesting a refill

## 2020-08-12 ENCOUNTER — Encounter: Payer: Self-pay | Admitting: Orthopedic Surgery

## 2020-08-12 NOTE — Progress Notes (Signed)
Office Visit Note   Patient: Lori [name]           Date of Birth: 12-Jul-1956           MRN: [id number] Visit Date: 08/09/2020 Requested by: Biagio Borg, MD [place],  [place] [id number] PCP: Biagio Borg, MD  Subjective: Chief Complaint  Patient presents with  . Right Hand - Pain    HPI: Lori [name] is a 64 y.o. female who presents to the office complaining of right thumb pain and right hand pain.  She has a history of MRI of the right hand revealing moderate osteoarthritis of the first [facility] joint.  She had her right [facility] joint injected at the last office visit which provided about 2 days of relief but pain quickly returned following the injection.  She localizes pain to the base of the right thumb as well as new symptoms that she has noticed in the last several months where she has burning and stinging pain throughout her right hand and all of her fingers.  She has increased numbness and tingling especially at night in all of her fingers and has to shake out her hand to the middle the night in order to get her sensation back at times.  She notes that she is dropping objects more than she used to.  She does have a history of cervical spine surgery and neck pain but denies any radicular pain down her arm or numbness/tingling or sudden increase in neck pain..                ROS: All systems reviewed are negative as they relate to the chief complaint within the history of present illness.  Patient denies fevers or chills.  Assessment & Plan: Visit Diagnoses:  1. 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  She has history of osteoarthritis of the first [facility] joint but her more recent symptoms sound like carpal tunnel syndrome.  She has positive Tinel's sign and positive Phalen's sign.  Also there is some concern that she  has some ulnar neuropathy as well with positive Froment's sign and decreased sensation in the fourth and fifth fingers.  Ordered nerve conduction study of the right arm to evaluate for carpal tunnel syndrome.  Patient will follow-up after nerve study to review results.  Also provided patient with a wrist splint for her right wrist to use at night to assist with her symptoms.  Follow-Up Instructions: No follow-ups on file.   Orders:  Orders Placed This Encounter  Procedures  . Ambulatory referral to Physical Medicine Rehab   No orders of the defined types were placed in this encounter.     Procedures: No procedures performed   Clinical Data: No additional findings.  Objective: Vital Signs: There were no vitals taken for this visit.  Physical Exam:  Constitutional: Patient appears well-developed HEENT:  Head: Normocephalic Eyes:EOM are normal Neck: Normal range of motion Cardiovascular: Normal rate Pulmonary/chest: Effort normal Neurologic: Patient is alert Skin: Skin is warm Psychiatric: Patient has normal mood and affect  Ortho Exam: Ortho exam demonstrates right thumb with tenderness at the first [facility] joint as well as pain with thumb circumduction.  No muscle wasting noted throughout the right hand but sensation is decreased on the palmar aspect of all 5 fingers compared to  the contralateral side.  Positive Tinel's sign on the right wrist, positive Phalen's sign of the right wrist.  Positive Froment's sign of the right hand.  Weakness with finger abduction and grip strength testing compared with the contralateral side.  Specialty Comments:  No specialty comments available.  Imaging: No results found.   PMFS History: Patient Active Problem List   Diagnosis Date Noted  . Genital herpes 11/12/2019  . Coccyx pain 11/12/2019  . Insomnia 11/12/2019  . Acute right-sided low back pain without sciatica 07/20/2019  . Grief 07/20/2019  . Vitamin D deficiency 07/20/2019  .  Hyperglycemia 04/21/2019  . Left knee pain 04/21/2019  . Pain 12/29/2018  . B12 deficiency 10/20/2018  . Nausea 04/27/2018  . Anemia, iron deficiency 04/27/2018  . Intermittent paresthesia of hand and foot 04/27/2018  . Headache 03/21/2018  . Lumbar radiculitis 08/15/2017  . Degenerative disc disease, lumbar 07/21/2017  . Encounter for well adult exam with abnormal findings 06/30/2017  . Epigastric pain 06/30/2017  . Restrictive lung disease 06/30/2017  . Leg cramping 06/09/2017  . Elbow contusion 06/09/2017  . Right elbow pain 05/27/2017  . Asthma-COPD overlap syndrome ([place]) 01/31/2017  . Dizziness 01/20/2017  . Seasonal and perennial allergic rhinitis 01/01/2017  . Moderate persistent asthma, uncomplicated 16/60/6301  . Pulmonary emphysema ([place]) 12/03/2016  . Tobacco use disorder 12/03/2016  . Chronic seasonal allergic rhinitis 12/03/2016  . Medication management 11/29/2016  . Cough 11/23/2016  . Rash 11/23/2016  . Vertigo 10/22/2016  . Gastroesophageal reflux disease with esophagitis 10/22/2016  . Non compliance w medication regimen 08/01/2016  . Allergic contact dermatitis due to metals 08/01/2016  . Migraine without aura and without status migrainosus, not intractable 07/22/2016  . Spasm of muscle of lower back 06/21/2016  . Chronic rhinitis 06/19/2016  . Gait difficulty 06/21/2015  . Fibromyalgia 06/21/2015  . Conversion reaction 06/21/2015  . Depression 06/21/2015  . Syncope and collapse 06/21/2015  . Plica syndrome of left knee 01/06/2015  . Anxiety state 09/06/2014  . History of MI 09/06/2014  . Cigarette smoker 06/27/2013  . Borderline hypertension 06/27/2013  . Chronic low back pain with sciatica 06/14/2013  . Leg pain, bilateral 06/14/2013  . Gastroparesis 10/26/2009  . Musculoskeletal chest pain 05/02/2009  . Hyperlipidemia LDL goal <100 11/18/2008  . COPD exacerbation ([place]) 11/18/2008  . HEPATIC CYST 11/18/2008   Past Medical History:  Diagnosis Date    . Acute MI ([place])    x3 - by report only. She has had 3 negative Myoview stress test with no evidence of prior infarct.  . Allergic rhinitis   . Allergy   . Anemia   . Ankle fracture 05/2016  . Anxiety   . Arthritis   . Asthma   . Barrett esophagus 2007  . Chest pain 05-02-2009   echo  EF 55%  . COPD (chronic obstructive pulmonary disease) with chronic bronchitis (HCC)    & Emphysema  . Depression   . Duodenitis without mention of hemorrhage 2007  . Esophageal reflux 2007  . Esophageal stricture   . Full dentures   . H/O hiatal hernia   . Hiatal hernia [id number]  . Hyperlipidemia   . Multiple fractures    from falls, fx rt. elbow, fx left wrist, bilateral ankles  . Pneumonia 10/2016  . Restrictive lung disease 06/30/2017  . Ulcer     Family History  Problem Relation Age of Onset  . Emphysema Father   . Dementia Mother   . Diabetes Maternal Grandmother   .  Diabetes Maternal Aunt   . Diabetes Other        mat. cousin  . Heart disease Brother   . Colon cancer Neg Hx   . Allergic rhinitis Neg Hx   . Angioedema Neg Hx   . Asthma Neg Hx   . Atopy Neg Hx   . Eczema Neg Hx   . Immunodeficiency Neg Hx   . Urticaria Neg Hx     Past Surgical History:  Procedure Laterality Date  . ABDOMINAL HYSTERECTOMY     BSO  . APPENDECTOMY    . CESAREAN SECTION     x 2  . CHOLECYSTECTOMY    . CHONDROPLASTY Left 01/06/2015   Procedure: CHONDROPLASTY;  Surgeon: Marybelle Killings, MD;  Location: [place];  Service: Orthopedics;  Laterality: Left;  . COLONOSCOPY    . ELBOW FRACTURE SURGERY     right  . KNEE ARTHROSCOPY WITH EXCISION PLICA Left 2/54/2706   Procedure: KNEE ARTHROSCOPY WITH EXCISION PLICA;  Surgeon: Marybelle Killings, MD;  Location: [place];  Service: Orthopedics;  Laterality: Left;  . Lower Extremity Arterial Dopplers  07/06/2013   RABI 1.0, LABI 1.1.; No evidence of significant vascular atherogenic plaque  . NECK SURGERY    . NM MYOVIEW LTD   09/2014   LOW RISK. Normal EF of 65% with no regional wall motion abnormalities. No ischemia or infarction.  . TONSILLECTOMY    . WRIST FRACTURE SURGERY     left, has plate   Social History   Occupational History  . Occupation: disable  Tobacco Use  . Smoking status: Current Every Day Smoker    Packs/day: 0.50    Years: 39.00    Pack years: 19.50    Types: Cigarettes    Start date: 08/06/1977  . Smokeless tobacco: Never Used  Vaping Use  . Vaping Use: Never used  Substance and Sexual Activity  . Alcohol use: No    Alcohol/week: 0.0 standard drinks  . Drug use: No  . Sexual activity: Not Currently

## 2020-08-13 ENCOUNTER — Encounter: Payer: Self-pay | Admitting: Orthopedic Surgery

## 2020-08-14 ENCOUNTER — Other Ambulatory Visit: Payer: Self-pay | Admitting: *Deleted

## 2020-08-14 MED ORDER — FASENRA 30 MG/ML ~~LOC~~ SOSY
PREFILLED_SYRINGE | SUBCUTANEOUS | 8 refills | Status: DC
Start: 2020-08-14 — End: 2020-08-15

## 2020-08-15 ENCOUNTER — Other Ambulatory Visit: Payer: Self-pay

## 2020-08-15 ENCOUNTER — Encounter: Payer: Self-pay | Admitting: Internal Medicine

## 2020-08-15 ENCOUNTER — Ambulatory Visit (INDEPENDENT_AMBULATORY_CARE_PROVIDER_SITE_OTHER): Payer: Medicare Other

## 2020-08-15 ENCOUNTER — Ambulatory Visit (INDEPENDENT_AMBULATORY_CARE_PROVIDER_SITE_OTHER): Payer: Medicare Other | Admitting: Internal Medicine

## 2020-08-15 VITALS — BP 110/70 | HR 81 | Temp 97.8°F | Ht 62.0 in | Wt 130.0 lb

## 2020-08-15 DIAGNOSIS — R739 Hyperglycemia, unspecified: Secondary | ICD-10-CM | POA: Diagnosis not present

## 2020-08-15 DIAGNOSIS — J3089 Other allergic rhinitis: Secondary | ICD-10-CM | POA: Diagnosis not present

## 2020-08-15 DIAGNOSIS — E559 Vitamin D deficiency, unspecified: Secondary | ICD-10-CM | POA: Diagnosis not present

## 2020-08-15 DIAGNOSIS — Z0000 Encounter for general adult medical examination without abnormal findings: Secondary | ICD-10-CM | POA: Diagnosis not present

## 2020-08-15 DIAGNOSIS — J309 Allergic rhinitis, unspecified: Secondary | ICD-10-CM | POA: Diagnosis not present

## 2020-08-15 DIAGNOSIS — R42 Dizziness and giddiness: Secondary | ICD-10-CM

## 2020-08-15 DIAGNOSIS — T7840XA Allergy, unspecified, initial encounter: Secondary | ICD-10-CM | POA: Insufficient documentation

## 2020-08-15 DIAGNOSIS — J302 Other seasonal allergic rhinitis: Secondary | ICD-10-CM

## 2020-08-15 DIAGNOSIS — E785 Hyperlipidemia, unspecified: Secondary | ICD-10-CM

## 2020-08-15 DIAGNOSIS — T7840XD Allergy, unspecified, subsequent encounter: Secondary | ICD-10-CM | POA: Diagnosis not present

## 2020-08-15 MED ORDER — MECLIZINE HCL 12.5 MG PO TABS: 12.5000 mg | ORAL_TABLET | Freq: Three times a day (TID) | ORAL | 1 refills | Status: DC | PRN

## 2020-08-15 MED ORDER — ROSUVASTATIN CALCIUM 20 MG PO TABS: 20.0000 mg | ORAL_TABLET | Freq: Every day | ORAL | 3 refills | Status: DC

## 2020-08-15 MED ORDER — METHYLPREDNISOLONE ACETATE 80 MG/ML IJ SUSP
80.0000 mg | Freq: Once | INTRAMUSCULAR | Status: AC
  Administered 2020-08-15: 80 mg via INTRAMUSCULAR

## 2020-08-15 NOTE — Progress Notes (Signed)
Subjective:    Patient ID: Lori [name], female    DOB: [birth year], 64 y.o.   MRN: [id number]  HPI  Here to f/u; overall doing ok,  Pt denies chest pain, increasing sob or doe, wheezing, orthopnea, PND, increased LE swelling, palpitations, dizziness or syncope, but asks for meclizine refill prn.  Pt denies new neurological symptoms such as new headache, or facial or extremity weakness or numbness.  Pt denies polydipsia, polyuria, or low sugar episode.  Pt states overall good compliance with meds, mostly trying to follow appropriate diet, with wt overall stable,  but little exercise however. Denies worsening depressive symptoms, suicidal ideation, or panic; has ongoing anxiety, Does have several wks ongoing nasal allergy symptoms with clearish congestion, itch and sneezing, without fever, pain, ST, cough, swelling or wheezing.  Has not yet started vit d otc. Past Medical History:  Diagnosis Date  . Acute MI ([place])    x3 - by report only. She has had 3 negative Myoview stress test with no evidence of prior infarct.  . Allergic rhinitis   . Allergy   . Anemia   . Ankle fracture 05/2016  . Anxiety   . Arthritis   . Asthma   . Barrett esophagus 2007  . Chest pain 05-02-2009   echo  EF 55%  . COPD (chronic obstructive pulmonary disease) with chronic bronchitis (HCC)    & Emphysema  . Depression   . Duodenitis without mention of hemorrhage 2007  . Esophageal reflux 2007  . Esophageal stricture   . Full dentures   . H/O hiatal hernia   . Hiatal hernia 3007,6226  . Hyperlipidemia   . Multiple fractures    from falls, fx rt. elbow, fx left wrist, bilateral ankles  . Pneumonia 10/2016  . Restrictive lung disease 06/30/2017  . Ulcer    Past Surgical History:  Procedure Laterality Date  . ABDOMINAL HYSTERECTOMY     BSO  . APPENDECTOMY    . CESAREAN SECTION     x 2  . CHOLECYSTECTOMY    . CHONDROPLASTY Left 01/06/2015   Procedure: CHONDROPLASTY;  Surgeon: Marybelle Killings, MD;   Location: [place];  Service: Orthopedics;  Laterality: Left;  . COLONOSCOPY    . ELBOW FRACTURE SURGERY     right  . KNEE ARTHROSCOPY WITH EXCISION PLICA Left 3/33/5456   Procedure: KNEE ARTHROSCOPY WITH EXCISION PLICA;  Surgeon: Marybelle Killings, MD;  Location: [place];  Service: Orthopedics;  Laterality: Left;  . Lower Extremity Arterial Dopplers  07/06/2013   RABI 1.0, LABI 1.1.; No evidence of significant vascular atherogenic plaque  . NECK SURGERY    . NM MYOVIEW LTD  09/2014   LOW RISK. Normal EF of 65% with no regional wall motion abnormalities. No ischemia or infarction.  . TONSILLECTOMY    . WRIST FRACTURE SURGERY     left, has plate    reports that she has been smoking cigarettes. She started smoking about 43 years ago. She has a 19.50 pack-year smoking history. She has never used smokeless tobacco. She reports that she does not drink alcohol and does not use drugs. family history includes Dementia in her mother; Diabetes in her maternal aunt, maternal grandmother, and another family member; Emphysema in her father; Heart disease in her brother. Allergies  Allergen Reactions  . Bee Venom Swelling  . Codeine Nausea Only and Other (See Comments)    CAUSES ULCERS  . Ibuprofen Nausea And Vomiting  .  Clonidine Derivatives Other (See Comments)    Unknown   . Statins   . Tramadol Nausea Only  . Latex Rash  . Levofloxacin Nausea Only  . Propoxyphene N-Acetaminophen Nausea Only   Current Outpatient Medications on File Prior to Visit  Medication Sig Dispense Refill  . ALPRAZolam (XANAX) 1 MG tablet TAKE 1 TABLET BY MOUTH AT BEDTIME AS NEEDED FOR ANXIETY 90 tablet 1  . cyclobenzaprine (FLEXERIL) 5 MG tablet Take 1 tablet (5 mg total) by mouth 3 (three) times daily as needed. for muscle spams 60 tablet 2  . DEXILANT 60 MG capsule TAKE 1 CAPSULE BY MOUTH  DAILY 90 capsule 3  . EPINEPHrine 0.3 mg/0.3 mL IJ SOAJ injection INJECT AS DIRECTED FOR SEVERE  ALLERGIC REACTION 2 each 0  . gabapentin (NEURONTIN) 300 MG capsule Take by mouth.    . montelukast (SINGULAIR) 10 MG tablet Take 1 tablet (10 mg total) by mouth at bedtime. 90 tablet 3  . NARCAN 4 MG/0.1ML LIQD nasal spray kit CALL 911. ADMINISTER A SINGLE SPRAY OF NARCAN IN ONE NOSTRIL. REPEAT EVERY 3 MINUTES AS NEEDED IF NO OR MINIMAL RESPONSE.    . nitroGLYCERIN (NITROSTAT) 0.4 MG SL tablet DISSOLVE ONE TABLET UNDER THE TONGUE EVERY 5 MINUTES AS NEEDED FOR CHEST PAIN.  DO NOT EXCEED A TOTAL OF 3 DOSES IN 15 MINUTES 25 tablet 0  . pneumococcal 23 valent vaccine (PNEUMOVAX 23) 25 MCG/0.5ML injection Pneumovax-23 25 mcg/0.5 mL injection syringe  ADM 0.5ML IM UTD    . valACYclovir (VALTREX) 1000 MG tablet TAKE 1 TABLET BY MOUTH once daily 270 tablet 3   No current facility-administered medications on file prior to visit.   Review of Systems All otherwise neg per pt    Objective:   Physical Exam BP 110/70 (BP Location: Left Arm, Patient Position: Sitting, Cuff Size: Large)   Pulse 81   Temp 97.8 F (36.6 C) (Oral)   Ht '5\' 2"'  (1.575 m)   Wt 130 lb (59 kg)   SpO2 97%   BMI 23.78 kg/m  VS noted,  Constitutional: Pt appears in NAD HENT: Head: NCAT.  Right Ear: External ear normal.  Left Ear: External ear normal.  Eyes: . Pupils are equal, round, and reactive to light. Conjunctivae and EOM are normal Nose: without d/c or deformity Neck: Neck supple. Gross normal ROM Cardiovascular: Normal rate and regular rhythm.   Pulmonary/Chest: Effort normal and breath sounds without rales or wheezing.  Abd:  Soft, NT, ND, + BS, no organomegaly Neurological: Pt is alert. At baseline orientation, motor grossly intact Skin: Skin is warm. No rashes, other new lesions, no LE edema Psychiatric: Pt behavior is normal without agitation  All otherwise neg per pt Lab Results  Component Value Date   WBC 8.8 11/12/2019   HGB 12.8 11/12/2019   HCT 38.3 11/12/2019   PLT 279.0 11/12/2019   GLUCOSE 80  11/12/2019   CHOL 246 (H) 11/12/2019   TRIG 388.0 (H) 11/12/2019   HDL 29.40 (L) 11/12/2019   LDLDIRECT 157.0 11/12/2019   LDLCALC Comment 03/12/2017   ALT 9 11/12/2019   AST 9 11/12/2019   NA 138 11/12/2019   K 3.6 11/12/2019   CL 109 11/12/2019   CREATININE 1.07 11/12/2019   BUN 11 11/12/2019   CO2 22 11/12/2019   TSH 1.45 11/12/2019   INR 0.9 08/31/2008   HGBA1C 5.6 11/12/2019          Assessment & Plan:

## 2020-08-15 NOTE — Progress Notes (Signed)
Subjective:   Lori [name] is a 64 y.o. female who presents for Medicare Annual (Subsequent) preventive examination.  Review of Systems    No ROS. Medicare Wellness Visit. Additional risk factors are reflected in social history. Cardiac Risk Factors include: advanced age (>74mn, >>46women);dyslipidemia;hypertension;family history of premature cardiovascular disease;smoking/ tobacco exposure     Objective:    Today's Vitals   08/15/20 1108  BP: 110/70  Pulse: 81  Temp: 97.8 F (36.6 C)  SpO2: 97%  Weight: 130 lb (59 kg)  Height: '5\' 2"'  (1.575 m)  PainSc: 0-No pain   Body mass index is 23.78 kg/m.  Advanced Directives 08/15/2020 07/21/2019 12/28/2018 09/15/2017 08/18/2017 08/15/2017 07/18/2017  Does Patient Have a Medical Advance Directive? Yes No No No No No No  Does patient want to make changes to medical advance directive? No - Patient declined - - - - - -  Would patient like information on creating a medical advance directive? - No - Patient declined No - Patient declined No - Patient declined No - Patient declined No - Patient declined -    Current Medications (verified) Outpatient Encounter Medications as of 08/15/2020  Medication Sig  . ALPRAZolam (XANAX) 1 MG tablet TAKE 1 TABLET BY MOUTH AT BEDTIME AS NEEDED FOR ANXIETY  . cyclobenzaprine (FLEXERIL) 5 MG tablet Take 1 tablet (5 mg total) by mouth 3 (three) times daily as needed. for muscle spams  . DEXILANT 60 MG capsule TAKE 1 CAPSULE BY MOUTH  DAILY  . EPINEPHrine 0.3 mg/0.3 mL IJ SOAJ injection INJECT AS DIRECTED FOR SEVERE ALLERGIC REACTION  . gabapentin (NEURONTIN) 300 MG capsule Take by mouth.  . montelukast (SINGULAIR) 10 MG tablet Take 1 tablet (10 mg total) by mouth at bedtime.  .Marland KitchenNARCAN 4 MG/0.1ML LIQD nasal spray kit CALL 911. ADMINISTER A SINGLE SPRAY OF NARCAN IN ONE NOSTRIL. REPEAT EVERY 3 MINUTES AS NEEDED IF NO OR MINIMAL RESPONSE.  . nitroGLYCERIN (NITROSTAT) 0.4 MG SL tablet DISSOLVE ONE TABLET  UNDER THE TONGUE EVERY 5 MINUTES AS NEEDED FOR CHEST PAIN.  DO NOT EXCEED A TOTAL OF 3 DOSES IN 15 MINUTES  . rosuvastatin (CRESTOR) 20 MG tablet Take 1 tablet (20 mg total) by mouth daily.  . valACYclovir (VALTREX) 1000 MG tablet TAKE 1 TABLET BY MOUTH once daily  . [DISCONTINUED] Benralizumab SOSY 30 mg   . [DISCONTINUED] Benralizumab SOSY 30 mg    No facility-administered encounter medications on file as of 08/15/2020.    Allergies (verified) Bee venom, Codeine, Ibuprofen, Clonidine derivatives, Statins, Tramadol, Latex, Levofloxacin, and Propoxyphene n-acetaminophen   History: Past Medical History:  Diagnosis Date  . Acute MI ([place]    x3 - by report only. She has had 3 negative Myoview stress test with no evidence of prior infarct.  . Allergic rhinitis   . Allergy   . Anemia   . Ankle fracture 05/2016  . Anxiety   . Arthritis   . Asthma   . Barrett esophagus 2007  . Chest pain 05-02-2009   echo  EF 55%  . COPD (chronic obstructive pulmonary disease) with chronic bronchitis (HCC)    & Emphysema  . Depression   . Duodenitis without mention of hemorrhage 2007  . Esophageal reflux 2007  . Esophageal stricture   . Full dentures   . H/O hiatal hernia   . Hiatal hernia [id number] . Hyperlipidemia   . Multiple fractures    from falls, fx rt. elbow, fx left wrist, bilateral ankles  .  Pneumonia 10/2016  . Restrictive lung disease 06/30/2017  . Ulcer    Past Surgical History:  Procedure Laterality Date  . ABDOMINAL HYSTERECTOMY     BSO  . APPENDECTOMY    . CESAREAN SECTION     x 2  . CHOLECYSTECTOMY    . CHONDROPLASTY Left 01/06/2015   Procedure: CHONDROPLASTY;  Surgeon: Marybelle Killings, MD;  Location: [place];  Service: Orthopedics;  Laterality: Left;  . COLONOSCOPY    . ELBOW FRACTURE SURGERY     right  . KNEE ARTHROSCOPY WITH EXCISION PLICA Left 2/95/2841   Procedure: KNEE ARTHROSCOPY WITH EXCISION PLICA;  Surgeon: Marybelle Killings, MD;  Location: [place];  Service: Orthopedics;  Laterality: Left;  . Lower Extremity Arterial Dopplers  07/06/2013   RABI 1.0, LABI 1.1.; No evidence of significant vascular atherogenic plaque  . NECK SURGERY    . NM MYOVIEW LTD  09/2014   LOW RISK. Normal EF of 65% with no regional wall motion abnormalities. No ischemia or infarction.  . TONSILLECTOMY    . WRIST FRACTURE SURGERY     left, has plate   Family History  Problem Relation Age of Onset  . Emphysema Father   . Dementia Mother   . Diabetes Maternal Grandmother   . Diabetes Maternal Aunt   . Diabetes Other        mat. cousin  . Heart disease Brother   . Colon cancer Neg Hx   . Allergic rhinitis Neg Hx   . Angioedema Neg Hx   . Asthma Neg Hx   . Atopy Neg Hx   . Eczema Neg Hx   . Immunodeficiency Neg Hx   . Urticaria Neg Hx    Social History   Socioeconomic History  . Marital status: Married    Spouse name: [name]  . Number of children: 2  . Years of education: HS  . Highest education level: Not on file  Occupational History  . Occupation: disable  Tobacco Use  . Smoking status: Current Every Day Smoker    Packs/day: 0.50    Years: 39.00    Pack years: 19.50    Types: Cigarettes    Start date: 08/06/1977  . Smokeless tobacco: Never Used  Vaping Use  . Vaping Use: Never used  Substance and Sexual Activity  . Alcohol use: No    Alcohol/week: 0.0 standard drinks  . Drug use: No  . Sexual activity: Not Currently  Other Topics Concern  . Not on file  Social History Narrative   Patient lives at home spouse - [name].  Has 2 daughters (12 & 65).   Currently "disabled".   Caffeine Use: tea 4 glasses daily.    Smokes ~1/2 PPD  05/15/15   Walks everyday - no set routine.      [facility] (12/03/16):   Originally from [facility]. 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 No bird exposure. No known mold exposure.    Social Determinants of Health   Financial Resource  Strain: Low Risk   . Difficulty of Paying Living Expenses: Not hard at all  Food Insecurity: No Food Insecurity  . Worried About Charity fundraiser in the Last Year: Never true  . Ran Out of Food in the Last Year: Never true  Transportation Needs: No Transportation Needs  . Lack of Transportation (Medical): No  . Lack of Transportation (Non-Medical): No  Physical Activity: Sufficiently Active  .  Days of Exercise per Week: 5 days  . Minutes of Exercise per Session: 30 min  Stress: Stress Concern Present  . Feeling of Stress : Very much  Social Connections:   . Frequency of Communication with Friends and Family: Not on file  . Frequency of Social Gatherings with Friends and Family: Not on file  . Attends Religious Services: Not on file  . Active Member of Clubs or Organizations: Not on file  . Attends Archivist Meetings: Not on file  . Marital Status: Not on file    Tobacco Counseling Ready to quit: Not Answered Counseling given: Not Answered   Clinical Intake:     Pain Score: 0-No pain     BMI - recorded: 23.78 Nutritional Status: BMI of 19-24  Normal Nutritional Risks: None Diabetes: No  How often do you need to have someone help you when you read instructions, pamphlets, or other written materials from your doctor or pharmacy?: 1 - Never What is the last grade level you completed in school?: HSG  Diabetic? no  Interpreter Needed?: No  Information entered by :: Lisette Abu, LPN   Activities of Daily Living In your present state of health, do you have any difficulty performing the following activities: 08/15/2020 07/17/2020  Hearing? N N  Vision? N N  Difficulty concentrating or making decisions? N N  Walking or climbing stairs? N N  Dressing or bathing? N N  Doing errands, shopping? N N  Preparing Food and eating ? N -  Using the Toilet? N -  In the past six months, have you accidently leaked urine? Y -  Do you have problems with loss of  bowel control? N -  Managing your Medications? N -  Managing your Finances? N -  Housekeeping or managing your Housekeeping? N -  Some recent data might be hidden    Patient Care Team: Biagio Borg, MD as PCP - General (Internal Medicine) Harvie Junior, MD as Referring Physician (Specialist) Harvie Junior, MD as Referring Physician (Specialist) Harvie Junior, MD as Referring Physician (Specialist) Loletha Carrow Kirke Corin, MD as Consulting Physician (Gastroenterology) Orie Rout, MD as Referring Physician (Specialist) Valentina Shaggy, MD as Consulting Physician (Allergy and Immunology) Gregor Hams, MD as Referring Physician (Family Medicine)  Indicate any recent Medical Services you may have received from other than [facility] providers in the past year (date may be approximate).     Assessment:   This is a routine wellness examination for [place].  Hearing/Vision screen No exam data present  Dietary issues and exercise activities discussed: Current Exercise Habits: Home exercise routine, Type of exercise: walking, Time (Minutes): 30, Frequency (Times/Week): 5, Weekly Exercise (Minutes/Week): 150, Intensity: Moderate, Exercise limited by: psychological condition(s);respiratory conditions(s)  Goals    . Try to not get upset by other people     Do my very best to control how I react to the situations I cannot control.      Depression Screen PHQ 2/9 Scores 08/15/2020 07/17/2020 11/12/2019 10/20/2018 12/15/2017 08/18/2017 07/02/2017  PHQ - 2 Score '1 4 1 ' 0 '1 1 2  ' PHQ- 9 Score - 12 - - - - 7    Fall Risk Fall Risk  08/15/2020 07/17/2020 11/12/2019 10/20/2018 12/15/2017  Falls in the past year? 0 0 1 0 No  Comment - - - - -  Number falls in past yr: 0 0 0 - -  Injury with Fall? 0 0 0 - -  Comment - - - - -  Risk Factor Category  - - - - -  Risk for fall due to : No Fall Risks - - - -  Follow up Falls evaluation completed - - - -    Any stairs in or around the home? No   If so, are there any without handrails? No  Home free of loose throw rugs in walkways, pet beds, electrical cords, etc? Yes  Adequate lighting in your home to reduce risk of falls? Yes   ASSISTIVE DEVICES UTILIZED TO PREVENT FALLS:  Life alert? No  Use of a cane, walker or w/c? No  Grab bars in the bathroom? No  Shower chair or bench in shower? No  Elevated toilet seat or a handicapped toilet? No   TIMED UP AND GO:  Was the test performed? No .  Length of time to ambulate 10 feet: 0 sec.   Gait steady and fast without use of assistive device  Cognitive Function: MMSE - Mini Mental State Exam 07/02/2017  Orientation to time 5  Orientation to Place 5  Registration 3  Attention/ Calculation 3  Recall 2  Language- name 2 objects 2  Language- repeat 1  Language- follow 3 step command 3  Language- read & follow direction 1  Write a sentence 1  Copy design 1  Total score 27     6CIT Screen 08/15/2020  What Year? 0 points  What month? 0 points  What time? 0 points  Count back from 20 0 points  Months in reverse 0 points  Repeat phrase 0 points  Total Score 0    Immunizations Immunization History  Administered Date(s) Administered  . Influenza,inj,Quad PF,6+ Mos 12/03/2016, 05/18/2017, 05/20/2018, 05/25/2019  . Influenza,inj,Quad PF,6-35 Mos 05/18/2019  . Influenza-Unspecified 05/18/2017, 07/17/2018  . Pneumococcal Conjugate-13 12/31/2016  . Pneumococcal Polysaccharide-23 01/17/2014, 12/31/2017  . Tdap 09/09/2013, 05/10/2016    TDAP status: Up to date Flu Vaccine status: Declined, Education has been provided regarding the importance of this vaccine but patient still declined. Advised may receive this vaccine at local pharmacy or Health Dept. Aware to provide a copy of the vaccination record if obtained from local pharmacy or Health Dept. Verbalized acceptance and understanding. Pneumococcal vaccine status: Up to date Covid-19 vaccine status: Information provided on  how to obtain vaccines.   Qualifies for Shingles Vaccine? Yes   Zostavax completed No   Shingrix Completed?: No.    Education has been provided regarding the importance of this vaccine. Patient has been advised to call insurance company to determine out of pocket expense if they have not yet received this vaccine. Advised may also receive vaccine at local pharmacy or Health Dept. Verbalized acceptance and understanding.  Screening Tests Health Maintenance  Topic Date Due  . COVID-19 Vaccine (1) Never done  . INFLUENZA VACCINE  04/16/2020  . MAMMOGRAM  11/11/2020 (Originally 06/17/2018)  . COLONOSCOPY  08/30/2021  . TETANUS/TDAP  05/10/2026  . Hepatitis C Screening  Completed  . HIV Screening  Completed    Health Maintenance  Health Maintenance Due  Topic Date Due  . COVID-19 Vaccine (1) Never done  . INFLUENZA VACCINE  04/16/2020    Colorectal cancer screening: Completed 08/30/2016. Repeat every 5 years Mammogram status: Completed 06/17/2016. Repeat every year  Lung Cancer Screening: (Low Dose CT Chest recommended if Age 5-80 years, 30 pack-year currently smoking OR have quit w/in 15years.) does qualify.   Lung Cancer Screening Referral: no  Additional Screening:  Hepatitis C Screening: does qualify; Completed yes  Vision  Screening: Recommended annual ophthalmology exams for early detection of glaucoma and other disorders of the eye. Is the patient up to date with their annual eye exam?  Yes  Who is the provider or what is the name of the office in which the patient attends annual eye exams? Midge Aver, MD If pt is not established with a provider, would they like to be referred to a provider to establish care? No .   Dental Screening: Recommended annual dental exams for proper oral hygiene  Community Resource Referral / Chronic Care Management: CRR required this visit?  No   CCM required this visit?  No      Plan:     I have personally reviewed and noted the  following in the patient's chart:   . Medical and social history . Use of alcohol, tobacco or illicit drugs  . Current medications and supplements . Functional ability and status . Nutritional status . Physical activity . Advanced directives . List of other physicians . Hospitalizations, surgeries, and ER visits in previous 12 months . Vitals . Screenings to include cognitive, depression, and falls . Referrals and appointments  In addition, I have reviewed and discussed with patient certain preventive protocols, quality metrics, and best practice recommendations. A written personalized care plan for preventive services as well as general preventive health recommendations were provided to patient.     Sheral Flow, LPN   [id number]   Nurse Notes: n/a       Subjective:   Lori [name] is a 64 y.o. female who presents for Medicare Annual (Subsequent) preventive examination.  Review of Systems    No ROS. Medicare Wellness Visit. Additional risk factors are reflected in social history. Cardiac Risk Factors include: advanced age (>59mn, >>21women);dyslipidemia;hypertension;family history of premature cardiovascular disease;smoking/ tobacco exposure     Objective:    Today's Vitals   08/15/20 1108  BP: 110/70  Pulse: 81  Temp: 97.8 F (36.6 C)  SpO2: 97%  Weight: 130 lb (59 kg)  Height: '5\' 2"'  (1.575 m)  PainSc: 0-No pain   Body mass index is 23.78 kg/m.  Advanced Directives 08/15/2020 07/21/2019 12/28/2018 09/15/2017 08/18/2017 08/15/2017 07/18/2017  Does Patient Have a Medical Advance Directive? Yes No No No No No No  Does patient want to make changes to medical advance directive? No - Patient declined - - - - - -  Would patient like information on creating a medical advance directive? - No - Patient declined No - Patient declined No - Patient declined No - Patient declined No - Patient declined -    Current Medications (verified) Outpatient Encounter  Medications as of 08/15/2020  Medication Sig  . ALPRAZolam (XANAX) 1 MG tablet TAKE 1 TABLET BY MOUTH AT BEDTIME AS NEEDED FOR ANXIETY  . cyclobenzaprine (FLEXERIL) 5 MG tablet Take 1 tablet (5 mg total) by mouth 3 (three) times daily as needed. for muscle spams  . DEXILANT 60 MG capsule TAKE 1 CAPSULE BY MOUTH  DAILY  . EPINEPHrine 0.3 mg/0.3 mL IJ SOAJ injection INJECT AS DIRECTED FOR SEVERE ALLERGIC REACTION  . gabapentin (NEURONTIN) 300 MG capsule Take by mouth.  . montelukast (SINGULAIR) 10 MG tablet Take 1 tablet (10 mg total) by mouth at bedtime.  .Marland KitchenNARCAN 4 MG/0.1ML LIQD nasal spray kit CALL 911. ADMINISTER A SINGLE SPRAY OF NARCAN IN ONE NOSTRIL. REPEAT EVERY 3 MINUTES AS NEEDED IF NO OR MINIMAL RESPONSE.  . nitroGLYCERIN (NITROSTAT) 0.4 MG SL tablet DISSOLVE ONE TABLET UNDER  THE TONGUE EVERY 5 MINUTES AS NEEDED FOR CHEST PAIN.  DO NOT EXCEED A TOTAL OF 3 DOSES IN 15 MINUTES  . rosuvastatin (CRESTOR) 20 MG tablet Take 1 tablet (20 mg total) by mouth daily.  . valACYclovir (VALTREX) 1000 MG tablet TAKE 1 TABLET BY MOUTH once daily  . [DISCONTINUED] Benralizumab SOSY 30 mg   . [DISCONTINUED] Benralizumab SOSY 30 mg    No facility-administered encounter medications on file as of 08/15/2020.    Allergies (verified) Bee venom, Codeine, Ibuprofen, Clonidine derivatives, Statins, Tramadol, Latex, Levofloxacin, and Propoxyphene n-acetaminophen   History: Past Medical History:  Diagnosis Date  . Acute MI ([place])    x3 - by report only. She has had 3 negative Myoview stress test with no evidence of prior infarct.  . Allergic rhinitis   . Allergy   . Anemia   . Ankle fracture 05/2016  . Anxiety   . Arthritis   . Asthma   . Barrett esophagus 2007  . Chest pain 05-02-2009   echo  EF 55%  . COPD (chronic obstructive pulmonary disease) with chronic bronchitis (HCC)    & Emphysema  . Depression   . Duodenitis without mention of hemorrhage 2007  . Esophageal reflux 2007  . Esophageal  stricture   . Full dentures   . H/O hiatal hernia   . Hiatal hernia [id number]  . Hyperlipidemia   . Multiple fractures    from falls, fx rt. elbow, fx left wrist, bilateral ankles  . Pneumonia 10/2016  . Restrictive lung disease 06/30/2017  . Ulcer    Past Surgical History:  Procedure Laterality Date  . ABDOMINAL HYSTERECTOMY     BSO  . APPENDECTOMY    . CESAREAN SECTION     x 2  . CHOLECYSTECTOMY    . CHONDROPLASTY Left 01/06/2015   Procedure: CHONDROPLASTY;  Surgeon: Marybelle Killings, MD;  Location: [place];  Service: Orthopedics;  Laterality: Left;  . COLONOSCOPY    . ELBOW FRACTURE SURGERY     right  . KNEE ARTHROSCOPY WITH EXCISION PLICA Left 03/28/4579   Procedure: KNEE ARTHROSCOPY WITH EXCISION PLICA;  Surgeon: Marybelle Killings, MD;  Location: [place];  Service: Orthopedics;  Laterality: Left;  . Lower Extremity Arterial Dopplers  07/06/2013   RABI 1.0, LABI 1.1.; No evidence of significant vascular atherogenic plaque  . NECK SURGERY    . NM MYOVIEW LTD  09/2014   LOW RISK. Normal EF of 65% with no regional wall motion abnormalities. No ischemia or infarction.  . TONSILLECTOMY    . WRIST FRACTURE SURGERY     left, has plate   Family History  Problem Relation Age of Onset  . Emphysema Father   . Dementia Mother   . Diabetes Maternal Grandmother   . Diabetes Maternal Aunt   . Diabetes Other        mat. cousin  . Heart disease Brother   . Colon cancer Neg Hx   . Allergic rhinitis Neg Hx   . Angioedema Neg Hx   . Asthma Neg Hx   . Atopy Neg Hx   . Eczema Neg Hx   . Immunodeficiency Neg Hx   . Urticaria Neg Hx    Social History   Socioeconomic History  . Marital status: Married    Spouse name: [name]  . Number of children: 2  . Years of education: HS  . Highest education level: Not on file  Occupational History  . Occupation: disable  Tobacco Use  . Smoking status: Current Every Day Smoker    Packs/day: 0.50    Years:  39.00    Pack years: 19.50    Types: Cigarettes    Start date: 08/06/1977  . Smokeless tobacco: Never Used  Vaping Use  . Vaping Use: Never used  Substance and Sexual Activity  . Alcohol use: No    Alcohol/week: 0.0 standard drinks  . Drug use: No  . Sexual activity: Not Currently  Other Topics Concern  . Not on file  Social History Narrative   Patient lives at home spouse - [name].  Has 2 daughters (15 & 55).   Currently "disabled".   Caffeine Use: tea 4 glasses daily.    Smokes ~1/2 PPD  05/15/15   Walks everyday - no set routine.      [facility] (12/03/16):   Originally from [facility]. 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 No bird exposure. No known mold exposure.    Social Determinants of Health   Financial Resource Strain: Low Risk   . Difficulty of Paying Living Expenses: Not hard at all  Food Insecurity: No Food Insecurity  . Worried About Charity fundraiser in the Last Year: Never true  . Ran Out of Food in the Last Year: Never true  Transportation Needs: No Transportation Needs  . Lack of Transportation (Medical): No  . Lack of Transportation (Non-Medical): No  Physical Activity: Sufficiently Active  . Days of Exercise per Week: 5 days  . Minutes of Exercise per Session: 30 min  Stress: Stress Concern Present  . Feeling of Stress : Very much  Social Connections:   . Frequency of Communication with Friends and Family: Not on file  . Frequency of Social Gatherings with Friends and Family: Not on file  . Attends Religious Services: Not on file  . Active Member of Clubs or Organizations: Not on file  . Attends Archivist Meetings: Not on file  . Marital Status: Not on file    Tobacco Counseling Ready to quit: Not Answered Counseling given: Not Answered   Clinical Intake:     Pain Score: 0-No pain     BMI - recorded: 23.78 Nutritional Status: BMI of 19-24  Normal Nutritional Risks: None Diabetes:  No  How often do you need to have someone help you when you read instructions, pamphlets, or other written materials from your doctor or pharmacy?: 1 - Never What is the last grade level you completed in school?: HSG  Diabetic? no  Interpreter Needed?: No  Information entered by :: Lisette Abu, LPN   Activities of Daily Living In your present state of health, do you have any difficulty performing the following activities: 08/15/2020 07/17/2020  Hearing? N N  Vision? N N  Difficulty concentrating or making decisions? N N  Walking or climbing stairs? N N  Dressing or bathing? N N  Doing errands, shopping? N N  Preparing Food and eating ? N -  Using the Toilet? N -  In the past six months, have you accidently leaked urine? Y -  Do you have problems with loss of bowel control? N -  Managing your Medications? N -  Managing your Finances? N -  Housekeeping or managing your Housekeeping? N -  Some recent data might be hidden    Patient Care Team: Biagio Borg, MD as PCP - General (Internal Medicine) Harvie Junior, MD as Referring Physician (Specialist) York Ram  M, MD as Referring Physician (Specialist) Harvie Junior, MD as Referring Physician (Specialist) Loletha Carrow Kirke Corin, MD as Consulting Physician (Gastroenterology) Orie Rout, MD as Referring Physician (Specialist) Valentina Shaggy, MD as Consulting Physician (Allergy and Immunology) Gregor Hams, MD as Referring Physician (Family Medicine)  Indicate any recent Medical Services you may have received from other than [facility] providers in the past year (date may be approximate).     Assessment:   This is a routine wellness examination for [place].  Hearing/Vision screen No exam data present  Dietary issues and exercise activities discussed: Current Exercise Habits: Home exercise routine, Type of exercise: walking, Time (Minutes): 30, Frequency (Times/Week): 5, Weekly Exercise (Minutes/Week):  150, Intensity: Moderate, Exercise limited by: psychological condition(s);respiratory conditions(s)  Goals    . Try to not get upset by other people     Do my very best to control how I react to the situations I cannot control.      Depression Screen PHQ 2/9 Scores 08/15/2020 07/17/2020 11/12/2019 10/20/2018 12/15/2017 08/18/2017 07/02/2017  PHQ - 2 Score '1 4 1 ' 0 '1 1 2  ' PHQ- 9 Score - 12 - - - - 7    Fall Risk Fall Risk  08/15/2020 07/17/2020 11/12/2019 10/20/2018 12/15/2017  Falls in the past year? 0 0 1 0 No  Comment - - - - -  Number falls in past yr: 0 0 0 - -  Injury with Fall? 0 0 0 - -  Comment - - - - -  Risk Factor Category  - - - - -  Risk for fall due to : No Fall Risks - - - -  Follow up Falls evaluation completed - - - -    Any stairs in or around the home? No  If so, are there any without handrails? No  Home free of loose throw rugs in walkways, pet beds, electrical cords, etc? Yes  Adequate lighting in your home to reduce risk of falls? Yes   ASSISTIVE DEVICES UTILIZED TO PREVENT FALLS:  Life alert? No  Use of a cane, walker or w/c? No  Grab bars in the bathroom? No  Shower chair or bench in shower? No  Elevated toilet seat or a handicapped toilet? No   TIMED UP AND GO:  Was the test performed? No .  Length of time to ambulate 10 feet: 0 sec.   Gait steady and fast without use of assistive device  Cognitive Function: MMSE - Mini Mental State Exam 07/02/2017  Orientation to time 5  Orientation to Place 5  Registration 3  Attention/ Calculation 3  Recall 2  Language- name 2 objects 2  Language- repeat 1  Language- follow 3 step command 3  Language- read & follow direction 1  Write a sentence 1  Copy design 1  Total score 27     6CIT Screen 08/15/2020  What Year? 0 points  What month? 0 points  What time? 0 points  Count back from 20 0 points  Months in reverse 0 points  Repeat phrase 0 points  Total Score 0    Immunizations Immunization History    Administered Date(s) Administered  . Influenza,inj,Quad PF,6+ Mos 12/03/2016, 05/18/2017, 05/20/2018, 05/25/2019  . Influenza,inj,Quad PF,6-35 Mos 05/18/2019  . Influenza-Unspecified 05/18/2017, 07/17/2018  . Pneumococcal Conjugate-13 12/31/2016  . Pneumococcal Polysaccharide-23 01/17/2014, 12/31/2017  . Tdap 09/09/2013, 05/10/2016    TDAP status: Up to date Flu Vaccine status: Up to date Pneumococcal vaccine status: Up to date  Covid-19 vaccine status: Completed vaccines  Qualifies for Shingles Vaccine? Yes   Zostavax completed No   Shingrix Completed?: No.    Education has been provided regarding the importance of this vaccine. Patient has been advised to call insurance company to determine out of pocket expense if they have not yet received this vaccine. Advised may also receive vaccine at local pharmacy or Health Dept. Verbalized acceptance and understanding.  Screening Tests Health Maintenance  Topic Date Due  . COVID-19 Vaccine (1) Never done  . INFLUENZA VACCINE  04/16/2020  . MAMMOGRAM  11/11/2020 (Originally 06/17/2018)  . COLONOSCOPY  08/30/2021  . TETANUS/TDAP  05/10/2026  . Hepatitis C Screening  Completed  . HIV Screening  Completed    Health Maintenance  Health Maintenance Due  Topic Date Due  . COVID-19 Vaccine (1) Never done  . INFLUENZA VACCINE  04/16/2020    Colorectal cancer screening: Completed 08/30/2016. Repeat every 5 years Mammogram status: Completed 06/17/2016. Repeat every year   Lung Cancer Screening: (Low Dose CT Chest recommended if Age 62-80 years, 30 pack-year currently smoking OR have quit w/in 15years.) does qualify.   Lung Cancer Screening Referral: no  Additional Screening:  Hepatitis C Screening: does qualify; Completed yes  Vision Screening: Recommended annual ophthalmology exams for early detection of glaucoma and other disorders of the eye. Is the patient up to date with their annual eye exam?  Yes  Who is the provider or what  is the name of the office in which the patient attends annual eye exams? Midge Aver, MD If pt is not established with a provider, would they like to be referred to a provider to establish care? No .   Dental Screening: Recommended annual dental exams for proper oral hygiene  Community Resource Referral / Chronic Care Management: CRR required this visit?  No   CCM required this visit?  No      Plan:     I have personally reviewed and noted the following in the patient's chart:   . Medical and social history . Use of alcohol, tobacco or illicit drugs  . Current medications and supplements . Functional ability and status . Nutritional status . Physical activity . Advanced directives . List of other physicians . Hospitalizations, surgeries, and ER visits in previous 12 months . Vitals . Screenings to include cognitive, depression, and falls . Referrals and appointments  In addition, I have reviewed and discussed with patient certain preventive protocols, quality metrics, and best practice recommendations. A written personalized care plan for preventive services as well as general preventive health recommendations were provided to patient.     Sheral Flow, LPN   21/11/1279   Nurse Notes: n/a

## 2020-08-15 NOTE — Patient Instructions (Signed)
You had the steroid shot today  Please take all new medication as prescribed - the meclizine for vertigo, and the crestor for cholesterol  Please take OTC Vitamin D3 at 2000 units per day, indefinitely.  Please continue all other medications as before, and refills have been done if requested.  Please have the pharmacy call with any other refills you may need.  Please continue your efforts at being more active, low cholesterol diet, and weight control  Please keep your appointments with your specialists as you may have planned  Please make an Appointment to return in 3 months

## 2020-08-15 NOTE — Patient Instructions (Signed)
[name] , Thank you for taking time to come for your Medicare Wellness Visit. I appreciate your ongoing commitment to your health goals. Please review the following plan we discussed and let me know if I can assist you in the future.   Screening recommendations/referrals: Colonoscopy: 08/30/2016; due every 5 years Mammogram: 06/17/2016; patient refused Bone Density: never done Recommended yearly ophthalmology/optometry visit for glaucoma screening and checkup Recommended yearly dental visit for hygiene and checkup  Vaccinations: Influenza vaccine: 05/18/2019 Pneumococcal vaccine: 12/31/2017 Tdap vaccine: 05/10/2016 Shingles vaccine: never done  Covid-19: up to date  Advanced directives: Advance directive discussed with you today. Even though you declined this today please call our office should you change your mind and we can give you the proper paperwork for you to fill out.  Conditions/risks identified: Yes; Reviewed health maintenance screenings with patient today and relevant education, vaccines, and/or referrals were provided. Please continue to do your personal lifestyle choices by: daily care of teeth and gums, regular physical activity (goal should be 5 days a week for 30 minutes), eat a healthy diet, avoid tobacco and drug use, limiting any alcohol intake, taking a low-dose aspirin (if not allergic or have been advised by your provider otherwise) and taking vitamins and minerals as recommended by your provider. Continue doing brain stimulating activities (puzzles, reading, adult coloring books, staying active) to keep memory sharp. Continue to eat heart healthy diet (full of fruits, vegetables, whole grains, lean protein, water--limit salt, fat, and sugar intake) and increase physical activity as tolerated.  Next appointment: Please schedule your next Medicare Wellness Visit with your Nurse Health Advisor in 1 year by calling [phone].   Preventive Care 40-64 Years,  Female Preventive care refers to lifestyle choices and visits with your health care provider that can promote health and wellness. What does preventive care include?  A yearly physical exam. This is also called an annual well check.  Dental exams once or twice a year.  Routine eye exams. Ask your health care provider how often you should have your eyes checked.  Personal lifestyle choices, including:  Daily care of your teeth and gums.  Regular physical activity.  Eating a healthy diet.  Avoiding tobacco and drug use.  Limiting alcohol use.  Practicing safe sex.  Taking low-dose aspirin daily starting at age 86.  Taking vitamin and mineral supplements as recommended by your health care provider. What happens during an annual well check? The services and screenings done by your health care provider during your annual well check will depend on your age, overall health, lifestyle risk factors, and family history of disease. Counseling  Your health care provider may ask you questions about your:  Alcohol use.  Tobacco use.  Drug use.  Emotional well-being.  Home and relationship well-being.  Sexual activity.  Eating habits.  Work and work Statistician.  Method of birth control.  Menstrual cycle.  Pregnancy history. Screening  You may have the following tests or measurements:  Height, weight, and BMI.  Blood pressure.  Lipid and cholesterol levels. These may be checked every 5 years, or more frequently if you are over 61 years old.  Skin check.  Lung cancer screening. You may have this screening every year starting at age 68 if you have a 30-pack-year history of smoking and currently smoke or have quit within the past 15 years.  Fecal occult blood test (FOBT) of the stool. You may have this test every year starting at age 3.  Flexible sigmoidoscopy or  colonoscopy. You may have a sigmoidoscopy every 5 years or a colonoscopy every 10 years starting at age  12.  Hepatitis C blood test.  Hepatitis B blood test.  Sexually transmitted disease (STD) testing.  Diabetes screening. This is done by checking your blood sugar (glucose) after you have not eaten for a while (fasting). You may have this done every 1-3 years.  Mammogram. This may be done every 1-2 years. Talk to your health care provider about when you should start having regular mammograms. This may depend on whether you have a family history of breast cancer.  BRCA-related cancer screening. This may be done if you have a family history of breast, ovarian, tubal, or peritoneal cancers.  Pelvic exam and Pap test. This may be done every 3 years starting at age 34. Starting at age 21, this may be done every 5 years if you have a Pap test in combination with an HPV test.  Bone density scan. This is done to screen for osteoporosis. You may have this scan if you are at high risk for osteoporosis. Discuss your test results, treatment options, and if necessary, the need for more tests with your health care provider. Vaccines  Your health care provider may recommend certain vaccines, such as:  Influenza vaccine. This is recommended every year.  Tetanus, diphtheria, and acellular pertussis (Tdap, Td) vaccine. You may need a Td booster every 10 years.  Zoster vaccine. You may need this after age 75.  Pneumococcal 13-valent conjugate (PCV13) vaccine. You may need this if you have certain conditions and were not previously vaccinated.  Pneumococcal polysaccharide (PPSV23) vaccine. You may need one or two doses if you smoke cigarettes or if you have certain conditions. Talk to your health care provider about which screenings and vaccines you need and how often you need them. This information is not intended to replace advice given to you by your health care provider. Make sure you discuss any questions you have with your health care provider. Document Released: 09/29/2015 Document Revised:  05/22/2016 Document Reviewed: 07/04/2015 Elsevier Interactive Patient Education  2017 [employer] Prevention in the Home Falls can cause injuries. They can happen to people of all ages. There are many things you can do to make your home safe and to help prevent falls. What can I do on the outside of my home?  Regularly fix the edges of walkways and driveways and fix any cracks.  Remove anything that might make you trip as you walk through a door, such as a raised step or threshold.  Trim any bushes or trees on the path to your home.  Use bright outdoor lighting.  Clear any walking paths of anything that might make someone trip, such as rocks or tools.  Regularly check to see if handrails are loose or broken. Make sure that both sides of any steps have handrails.  Any raised decks and porches should have guardrails on the edges.  Have any leaves, snow, or ice cleared regularly.  Use sand or salt on walking paths during winter.  Clean up any spills in your garage right away. This includes oil or grease spills. What can I do in the bathroom?  Use night lights.  Install grab bars by the toilet and in the tub and shower. Do not use towel bars as grab bars.  Use non-skid mats or decals in the tub or shower.  If you need to sit down in the shower, use a plastic,  non-slip stool.  Keep the floor dry. Clean up any water that spills on the floor as soon as it happens.  Remove soap buildup in the tub or shower regularly.  Attach bath mats securely with double-sided non-slip rug tape.  Do not have throw rugs and other things on the floor that can make you trip. What can I do in the bedroom?  Use night lights.  Make sure that you have a light by your bed that is easy to reach.  Do not use any sheets or blankets that are too big for your bed. They should not hang down onto the floor.  Have a firm chair that has side arms. You can use this for support while you get  dressed.  Do not have throw rugs and other things on the floor that can make you trip. What can I do in the kitchen?  Clean up any spills right away.  Avoid walking on wet floors.  Keep items that you use a lot in easy-to-reach places.  If you need to reach something above you, use a strong step stool that has a grab bar.  Keep electrical cords out of the way.  Do not use floor polish or wax that makes floors slippery. If you must use wax, use non-skid floor wax.  Do not have throw rugs and other things on the floor that can make you trip. What can I do with my stairs?  Do not leave any items on the stairs.  Make sure that there are handrails on both sides of the stairs and use them. Fix handrails that are broken or loose. Make sure that handrails are as long as the stairways.  Check any carpeting to make sure that it is firmly attached to the stairs. Fix any carpet that is loose or worn.  Avoid having throw rugs at the top or bottom of the stairs. If you do have throw rugs, attach them to the floor with carpet tape.  Make sure that you have a light switch at the top of the stairs and the bottom of the stairs. If you do not have them, ask someone to add them for you. What else can I do to help prevent falls?  Wear shoes that:  Do not have high heels.  Have rubber bottoms.  Are comfortable and fit you well.  Are closed at the toe. Do not wear sandals.  If you use a stepladder:  Make sure that it is fully opened. Do not climb a closed stepladder.  Make sure that both sides of the stepladder are locked into place.  Ask someone to hold it for you, if possible.  Clearly mark and make sure that you can see:  Any grab bars or handrails.  First and last steps.  Where the edge of each step is.  Use tools that help you move around (mobility aids) if they are needed. These include:  Canes.  Walkers.  Scooters.  Crutches.  Turn on the lights when you go into a  dark area. Replace any light bulbs as soon as they burn out.  Set up your furniture so you have a clear path. Avoid moving your furniture around.  If any of your floors are uneven, fix them.  If there are any pets around you, be aware of where they are.  Review your medicines with your doctor. Some medicines can make you feel dizzy. This can increase your chance of falling. Ask your doctor what other things  that you can do to help prevent falls. This information is not intended to replace advice given to you by your health care provider. Make sure you discuss any questions you have with your health care provider. Document Released: 06/29/2009 Document Revised: 02/08/2016 Document Reviewed: 10/07/2014 Elsevier Interactive Patient Education  2017 [employer].

## 2020-08-17 DIAGNOSIS — G43719 Chronic migraine without aura, intractable, without status migrainosus: Secondary | ICD-10-CM | POA: Diagnosis not present

## 2020-08-20 ENCOUNTER — Encounter: Payer: Self-pay | Admitting: Internal Medicine

## 2020-08-20 NOTE — Assessment & Plan Note (Signed)
To start vit d 2000 qd

## 2020-08-20 NOTE — Assessment & Plan Note (Signed)
With mild recurrent symptoms, for meclizine prn

## 2020-08-20 NOTE — Assessment & Plan Note (Signed)
Uncontrolled, for crestor 20 qd

## 2020-08-20 NOTE — Assessment & Plan Note (Signed)
stable overall by history and exam, recent data reviewed with pt, and pt to continue medical treatment as before,  to f/u any worsening symptoms or concerns  

## 2020-08-20 NOTE — Assessment & Plan Note (Addendum)
Mild to mod, for depomedrol im 80 mg, for allegra prn  I spent 31 minutes in preparing to see the patient by review of recent labs, imaging and procedures, obtaining and reviewing separately obtained history, communicating with the patient and family or caregiver, ordering medications, tests or procedures, and documenting clinical information in the EHR including the differential Dx, treatment, and any further evaluation and other management of allergies, vit d def, vertigo, hld, hyperglycemia

## 2020-08-21 ENCOUNTER — Ambulatory Visit (INDEPENDENT_AMBULATORY_CARE_PROVIDER_SITE_OTHER): Payer: Medicare Other

## 2020-08-21 ENCOUNTER — Other Ambulatory Visit: Payer: Self-pay

## 2020-08-21 ENCOUNTER — Other Ambulatory Visit (HOSPITAL_COMMUNITY): Payer: Self-pay | Admitting: Obstetrics and Gynecology

## 2020-08-21 ENCOUNTER — Ambulatory Visit (HOSPITAL_COMMUNITY)
Admission: RE | Admit: 2020-08-21 | Discharge: 2020-08-21 | Disposition: A | Discharge status: Home / Self Care | Payer: Medicare Other | Source: Ambulatory Visit | Attending: Obstetrics and Gynecology | Admitting: Obstetrics and Gynecology

## 2020-08-21 DIAGNOSIS — J4550 Severe persistent asthma, uncomplicated: Secondary | ICD-10-CM | POA: Diagnosis not present

## 2020-08-21 DIAGNOSIS — R0789 Other chest pain: Secondary | ICD-10-CM

## 2020-08-21 DIAGNOSIS — J449 Chronic obstructive pulmonary disease, unspecified: Secondary | ICD-10-CM | POA: Diagnosis not present

## 2020-08-21 MED ORDER — BENRALIZUMAB 30 MG/ML ~~LOC~~ SOSY
30.0000 mg | PREFILLED_SYRINGE | Freq: Once | SUBCUTANEOUS | Status: AC
Start: 2020-08-21 — End: 2020-08-21
  Administered 2020-08-21: 30 mg via SUBCUTANEOUS

## 2020-09-12 ENCOUNTER — Ambulatory Visit: Payer: Medicare Other | Admitting: Allergy & Immunology

## 2020-09-14 ENCOUNTER — Telehealth: Payer: Self-pay | Admitting: Internal Medicine

## 2020-09-14 ENCOUNTER — Ambulatory Visit: Payer: Medicare Other | Admitting: Allergy & Immunology

## 2020-09-14 DIAGNOSIS — G43719 Chronic migraine without aura, intractable, without status migrainosus: Secondary | ICD-10-CM | POA: Diagnosis not present

## 2020-09-14 MED ORDER — DEXILANT 60 MG PO CPDR
DELAYED_RELEASE_CAPSULE | ORAL | 0 refills | Status: DC
Start: 2020-09-14 — End: 2020-10-21

## 2020-09-14 NOTE — Telephone Encounter (Signed)
No medication remaining  Patient requesting refill for DEXILANT 60 MG capsule Pharmacy [facility] [address] [place], [place] - [address]

## 2020-09-19 ENCOUNTER — Encounter: Payer: Self-pay | Admitting: Physical Medicine and Rehabilitation

## 2020-09-19 ENCOUNTER — Other Ambulatory Visit: Payer: Self-pay

## 2020-09-19 ENCOUNTER — Ambulatory Visit (INDEPENDENT_AMBULATORY_CARE_PROVIDER_SITE_OTHER): Payer: Medicare Other | Admitting: Physical Medicine and Rehabilitation

## 2020-09-19 DIAGNOSIS — R202 Paresthesia of skin: Secondary | ICD-10-CM | POA: Diagnosis not present

## 2020-09-19 NOTE — Progress Notes (Signed)
Burning, stinging pain in right hand. Dropping things. Pain wakes her up at night. Right hand dominant. No lotion per patient. Numeric Pain Rating Scale and Functional Assessment Average Pain 10   In the last MONTH (on 0-10 scale) has pain interfered with the following?  1. General activity like being  able to carry out your everyday physical activities such as walking, climbing stairs, carrying groceries, or moving a chair?  Rating(10)

## 2020-09-20 NOTE — Procedures (Signed)
EMG & NCV Findings: Evaluation of the right median motor nerve showed reduced amplitude (4.7 mV).  The right median (across palm) sensory nerve showed prolonged distal peak latency (Wrist, 3.9 ms) and prolonged distal peak latency (Palm, 2.3 ms).  The right ulnar sensory nerve showed prolonged distal peak latency (4.0 ms) and decreased conduction velocity (Wrist-5th Digit, 35 m/s).  All remaining nerves (as indicated in the following tables) were within normal limits.    All examined muscles (as indicated in the following table) showed no evidence of electrical instability.    Impression: Essentially NORMAL electrodiagnostic study of the right upper limb.  There is no significant electrodiagnostic evidence of nerve entrapment, brachial plexopathy or generalized peripheral neuropathy.  The slowing of the distal median and ulnar sensory nerve action potentials is temperature artifact as demonstrated by shape and size of curves and despite heating the hand the recorded temperature was low.   As you know, purely sensory or demyelinating radiculopathies and chemical radiculitis may not be detected with this particular electrodiagnostic study.  Recommendations: 1.  Continue current management of symptoms. 2.  Follow-up with referring physician.  ___________________________ [name] [facility] Board Certified, American Board of Physical Medicine and Rehabilitation    Nerve Conduction Studies Anti Sensory Summary Table   Stim Site NR Peak (ms) Norm Peak (ms) P-T Amp (V) Norm P-T Amp Site1 Site2 Delta-P (ms) Dist (cm) Vel (m/s) Norm Vel (m/s)  Right Median Acr Palm Anti Sensory (2nd Digit)  29C  Wrist    *3.9 <3.6 46.9 >10 Wrist Palm 1.6 0.0    Palm    *2.3 <2.0 51.2         Right Radial Anti Sensory (Base 1st Digit)  29.3C  Wrist    2.6 <3.1 19.2  Wrist Base 1st Digit 2.6 0.0    Right Ulnar Anti Sensory (5th Digit)  29.4C  Wrist    *4.0 <3.7 21.3 >15.0 Wrist 5th Digit 4.0 14.0 *35 >38    Motor Summary Table   Stim Site NR Onset (ms) Norm Onset (ms) O-P Amp (mV) Norm O-P Amp Site1 Site2 Delta-0 (ms) Dist (cm) Vel (m/s) Norm Vel (m/s)  Right Median Motor (Abd Poll Brev)  29.6C  Wrist    3.6 <4.2 *4.7 >5 Elbow Wrist 4.0 20.0 50 >50  Elbow    7.6  4.5         Right Ulnar Motor (Abd Dig Min)  29.8C  Wrist    3.9 <4.2 6.2 >3 B Elbow Wrist 3.2 19.0 59 >53  B Elbow    7.1  5.8  A Elbow B Elbow 1.2 9.0 75 >53  A Elbow    8.3  5.7          EMG   Side Muscle Nerve Root Ins Act Fibs Psw Amp Dur Poly Recrt Int Dennie Bible Comment  Right Abd Poll Brev Median C8-T1 Nml Nml Nml Nml Nml 0 Nml Nml   Right 1stDorInt Ulnar C8-T1 Nml Nml Nml Nml Nml 0 Nml Nml     Nerve Conduction Studies Anti Sensory Left/Right Comparison   Stim Site L Lat (ms) R Lat (ms) L-R Lat (ms) L Amp (V) R Amp (V) L-R Amp (%) Site1 Site2 L Vel (m/s) R Vel (m/s) L-R Vel (m/s)  Median Acr Palm Anti Sensory (2nd Digit)  29C  Wrist  *3.9   46.9  Wrist Palm     Palm  *2.3   51.2        Radial Anti  Sensory (Base 1st Digit)  29.3C  Wrist  2.6   19.2  Wrist Base 1st Digit     Ulnar Anti Sensory (5th Digit)  29.4C  Wrist  *4.0   21.3  Wrist 5th Digit  *35    Motor Left/Right Comparison   Stim Site L Lat (ms) R Lat (ms) L-R Lat (ms) L Amp (mV) R Amp (mV) L-R Amp (%) Site1 Site2 L Vel (m/s) R Vel (m/s) L-R Vel (m/s)  Median Motor (Abd Poll Brev)  29.6C  Wrist  3.6   *4.7  Elbow Wrist  50   Elbow  7.6   4.5        Ulnar Motor (Abd Dig Min)  29.8C  Wrist  3.9   6.2  B Elbow Wrist  59   B Elbow  7.1   5.8  A Elbow B Elbow  75   A Elbow  8.3   5.7           Waveforms:

## 2020-09-20 NOTE — Progress Notes (Addendum)
Lori [name] - 65 y.o. female MRN [id number]  Date of birth: [birth year]  Office Visit Note: Visit Date: 09/19/2020 PCP: Corwin Levins, MD Referred by: Corwin Levins, MD  Subjective: Chief Complaint  Patient presents with  . Right Hand - Pain   HPI:  BRIANNON [name] is a 65 y.o. female who comes in today at the request of Dr. Burnard Bunting for electrodiagnostic study of the Right upper extremities.  Patient is Right hand dominant.  She reports 10 out of 10 stinging pain in the right hand particularly along the dorsum and palmar side of the thumb.  She reports dropping objects at times and having difficulty with the right hand.  Vaguely describes some numbness and tingling but is a really poor historian.  She denies any left-sided symptoms.  No frank radicular symptoms.  No history of diabetes.  History of chronic pain complicated by depression and anxiety. Prior electrodiagnostic studies of hte left upper extremity and bilateral lower extremities by Dr. Nita Sickle have been normal. They are detailed in my notes below.  ROS Otherwise per HPI.  Assessment & Plan: Visit Diagnoses:    ICD-10-CM   1. Paresthesia of skin  R20.2 NCV with EMG (electromyography)    Plan: Impression: Essentially NORMAL electrodiagnostic study of the right upper limb.  There is no significant electrodiagnostic evidence of nerve entrapment, brachial plexopathy or generalized peripheral neuropathy.  The slowing of the distal median and ulnar sensory nerve action potentials is temperature artifact as demonstrated by shape and size of curves and despite heating the hand the recorded temperature was low.   As you know, purely sensory or demyelinating radiculopathies and chemical radiculitis may not be detected with this particular electrodiagnostic study.  Recommendations: 1.  Continue current management of symptoms. 2.  Follow-up with referring physician.  Meds & Orders: No orders of the defined types were  placed in this encounter.   Orders Placed This Encounter  Procedures  . NCV with EMG (electromyography)    Follow-up: Return for  G. Dorene Grebe, MD.   Procedures: No procedures performed  EMG & NCV Findings: Evaluation of the right median motor nerve showed reduced amplitude (4.7 mV).  The right median (across palm) sensory nerve showed prolonged distal peak latency (Wrist, 3.9 ms) and prolonged distal peak latency (Palm, 2.3 ms).  The right ulnar sensory nerve showed prolonged distal peak latency (4.0 ms) and decreased conduction velocity (Wrist-5th Digit, 35 m/s).  All remaining nerves (as indicated in the following tables) were within normal limits.    All examined muscles (as indicated in the following table) showed no evidence of electrical instability.    Impression: Essentially NORMAL electrodiagnostic study of the right upper limb.  There is no significant electrodiagnostic evidence of nerve entrapment, brachial plexopathy or generalized peripheral neuropathy.  The slowing of the distal median and ulnar sensory nerve action potentials is temperature artifact as demonstrated by shape and size of curves and despite heating the hand the recorded temperature was low.   As you know, purely sensory or demyelinating radiculopathies and chemical radiculitis may not be detected with this particular electrodiagnostic study.  Recommendations: 1.  Continue current management of symptoms. 2.  Follow-up with referring physician.  ___________________________ [name] [facility] Board Certified, American Board of Physical Medicine and Rehabilitation    Nerve Conduction Studies Anti Sensory Summary Table   Stim Site NR Peak (ms) Norm Peak (ms) P-T Amp (V) Norm P-T Amp Site1 Site2 Delta-P (ms)  Dist (cm) Vel (m/s) Norm Vel (m/s)  Right Median Acr Palm Anti Sensory (2nd Digit)  29C  Wrist    *3.9 <3.6 46.9 >10 Wrist Palm 1.6 0.0    Palm    *2.3 <2.0 51.2         Right Radial Anti Sensory  (Base 1st Digit)  29.3C  Wrist    2.6 <3.1 19.2  Wrist Base 1st Digit 2.6 0.0    Right Ulnar Anti Sensory (5th Digit)  29.4C  Wrist    *4.0 <3.7 21.3 >15.0 Wrist 5th Digit 4.0 14.0 *35 >38   Motor Summary Table   Stim Site NR Onset (ms) Norm Onset (ms) O-P Amp (mV) Norm O-P Amp Site1 Site2 Delta-0 (ms) Dist (cm) Vel (m/s) Norm Vel (m/s)  Right Median Motor (Abd Poll Brev)  29.6C  Wrist    3.6 <4.2 *4.7 >5 Elbow Wrist 4.0 20.0 50 >50  Elbow    7.6  4.5         Right Ulnar Motor (Abd Dig Min)  29.8C  Wrist    3.9 <4.2 6.2 >3 B Elbow Wrist 3.2 19.0 59 >53  B Elbow    7.1  5.8  A Elbow B Elbow 1.2 9.0 75 >53  A Elbow    8.3  5.7          EMG   Side Muscle Nerve Root Ins Act Fibs Psw Amp Dur Poly Recrt Int Dennie Bible Comment  Right Abd Poll Brev Median C8-T1 Nml Nml Nml Nml Nml 0 Nml Nml   Right 1stDorInt Ulnar C8-T1 Nml Nml Nml Nml Nml 0 Nml Nml     Nerve Conduction Studies Anti Sensory Left/Right Comparison   Stim Site L Lat (ms) R Lat (ms) L-R Lat (ms) L Amp (V) R Amp (V) L-R Amp (%) Site1 Site2 L Vel (m/s) R Vel (m/s) L-R Vel (m/s)  Median Acr Palm Anti Sensory (2nd Digit)  29C  Wrist  *3.9   46.9  Wrist Palm     Palm  *2.3   51.2        Radial Anti Sensory (Base 1st Digit)  29.3C  Wrist  2.6   19.2  Wrist Base 1st Digit     Ulnar Anti Sensory (5th Digit)  29.4C  Wrist  *4.0   21.3  Wrist 5th Digit  *35    Motor Left/Right Comparison   Stim Site L Lat (ms) R Lat (ms) L-R Lat (ms) L Amp (mV) R Amp (mV) L-R Amp (%) Site1 Site2 L Vel (m/s) R Vel (m/s) L-R Vel (m/s)  Median Motor (Abd Poll Brev)  29.6C  Wrist  3.6   *4.7  Elbow Wrist  50   Elbow  7.6   4.5        Ulnar Motor (Abd Dig Min)  29.8C  Wrist  3.9   6.2  B Elbow Wrist  59   B Elbow  7.1   5.8  A Elbow B Elbow  75   A Elbow  8.3   5.7           Waveforms:             Clinical History: 07/28/2018  Patient Complaints:  This is a 65 year old female referred for evaluation of left hand paresthesias  concerning for ulnar neuropathy.    NCV & EMG Findings:  Extensive electrodiagnostic testing of the left upper extremity shows:  1. Left median, ulnar, dorsal ulnar cutaneous, and mixed palmar sensory  responses are within normal limits.  2. Left median motor responses within normal limits. Left ulnar motor response shows conduction velocity slowing across the elbow (A Elbow-B Elbow, 45 m/s), with normal latency and amplitude.   3. There is no evidence of active or chronic motor axonal loss changes affecting any of the tested muscles. Motor unit configuration and recruitment pattern is within normal limits.     Impression:  Left ulnar neuropathy with slowing across the elbow, purely demyelinating, and mild degree electrically.      ___________________________  Nita Sickle, DO ---- 07/17/2017 Patient Complaints: This is a 65 year old female referred for evaluation of bilateral leg cramps.  NCV & EMG Findings: Extensive electrodiagnostic testing of the right lower extremity and additional studies of the left shows:  1. Bilateral sural and superficial peroneal sensory responses are within normal limits. 2. Bilateral peroneal and tibial motor responses are within normal limits. 3. Right tibial H reflex study is within normal limits. 4. There is no evidence of active or chronic motor axon loss changes affecting any of the tested muscles. Motor unit configuration and recruitment pattern is within normal limits.  Impression: This is a normal study of the lower extremities.   In particular, there is no evidence of a lumbosacral radiculopathy, diffuse myopathy, or sensorimotor polyneuropathy.   ___________________________ Nita Sickle, DO     Objective:  VS:  HT:    WT:   BMI:     BP:   HR: bpm  TEMP: ( )  RESP:  Physical Exam Musculoskeletal:        General: No swelling, tenderness or deformity.     Comments: Inspection reveals no atrophy of the bilateral APB or FDI  or hand intrinsics.  There is nicotine staining of the fingertips.  There is osteoarthritic changes particular at the [facility] joint.  There is no swelling, color changes, allodynia or dystrophic changes. There is 5 out of 5 strength in the bilateral wrist extension, finger abduction and long finger flexion. There is intact sensation to light touch in all dermatomal and peripheral nerve distributions. There is a negative Hoffmann's test bilaterally.  Skin:    General: Skin is warm and dry.     Findings: No erythema or rash.  Neurological:     General: No focal deficit present.     Mental Status: She is alert and oriented to person, place, and time.     Motor: No weakness or abnormal muscle tone.     Coordination: Coordination normal.  Psychiatric:        Mood and Affect: Mood normal.        Behavior: Behavior normal.      Imaging: No results found.

## 2020-09-27 ENCOUNTER — Telehealth (INDEPENDENT_AMBULATORY_CARE_PROVIDER_SITE_OTHER): Payer: Medicare Other | Admitting: Family

## 2020-09-27 DIAGNOSIS — J069 Acute upper respiratory infection, unspecified: Secondary | ICD-10-CM | POA: Diagnosis not present

## 2020-09-27 MED ORDER — PROMETHAZINE-DM 6.25-15 MG/5ML PO SYRP: 5.0000 mL | ORAL_SOLUTION | Freq: Four times a day (QID) | ORAL | 0 refills | Status: DC | PRN

## 2020-09-27 NOTE — Progress Notes (Signed)
Lori [name] is a 64 y.o. female with the following history as recorded in Epic[employer]:  Patient Active Problem List   Diagnosis Date Noted  . Genital herpes 11/12/2019  . Coccyx pain 11/12/2019  . Insomnia 11/12/2019  . Acute right-sided low back pain without sciatica 07/20/2019  . Grief 07/20/2019  . Vitamin D deficiency 07/20/2019  . Hyperglycemia 04/21/2019  . Left knee pain 04/21/2019  . Pain 12/29/2018  . B12 deficiency 10/20/2018  . Bilateral hand pain 08/03/2018  . Nausea 04/27/2018  . Anemia, iron deficiency 04/27/2018  . Intermittent paresthesia of hand and foot 04/27/2018  . Headache 03/21/2018  . Lumbar radiculitis 08/15/2017  . Degenerative disc disease, lumbar 07/21/2017  . Encounter for well adult exam with abnormal findings 06/30/2017  . Epigastric pain 06/30/2017  . Restrictive lung disease 06/30/2017  . Leg cramping 06/09/2017  . Elbow contusion 06/09/2017  . Right elbow pain 05/27/2017  . Asthma-COPD overlap syndrome ([place]) 01/31/2017  . Dizziness 01/20/2017  . Seasonal and perennial allergic rhinitis 01/01/2017  . Moderate persistent asthma, uncomplicated 86/57/8469  . Pulmonary emphysema ([place]) 12/03/2016  . Tobacco use disorder 12/03/2016  . Chronic seasonal allergic rhinitis 12/03/2016  . Medication management 11/29/2016  . Cough 11/23/2016  . Rash 11/23/2016  . Vertigo 10/22/2016  . Gastroesophageal reflux disease with esophagitis 10/22/2016  . Non compliance w medication regimen 08/01/2016  . Allergic contact dermatitis due to metals 08/01/2016  . Migraine without aura and without status migrainosus, not intractable 07/22/2016  . Spasm of muscle of lower back 06/21/2016  . Chronic rhinitis 06/19/2016  . Gait difficulty 06/21/2015  . Fibromyalgia 06/21/2015  . Conversion reaction 06/21/2015  . Depression 06/21/2015  . Syncope and collapse 06/21/2015  . Plica syndrome of left knee 01/06/2015  . Anxiety state 09/06/2014  . History of MI  09/06/2014  . Cigarette smoker 06/27/2013  . Borderline hypertension 06/27/2013  . Chronic low back pain with sciatica 06/14/2013  . Leg pain, bilateral 06/14/2013  . Gastroparesis 10/26/2009  . Musculoskeletal chest pain 05/02/2009  . Hyperlipidemia LDL goal <100 11/18/2008  . COPD exacerbation ([place]) 11/18/2008  . HEPATIC CYST 11/18/2008    Current Outpatient Medications  Medication Sig Dispense Refill  . promethazine-dextromethorphan (PROMETHAZINE-DM) 6.25-15 MG/5ML syrup Take 5 mLs by mouth 4 (four) times daily as needed for cough. 118 mL 0  . ALPRAZolam (XANAX) 1 MG tablet TAKE 1 TABLET BY MOUTH AT BEDTIME AS NEEDED FOR ANXIETY 90 tablet 1  . cyclobenzaprine (FLEXERIL) 5 MG tablet Take 1 tablet (5 mg total) by mouth 3 (three) times daily as needed. for muscle spams 60 tablet 2  . DEXILANT 60 MG capsule TAKE 1 CAPSULE BY MOUTH  DAILY 90 capsule 0  . EPINEPHrine 0.3 mg/0.3 mL IJ SOAJ injection INJECT AS DIRECTED FOR SEVERE ALLERGIC REACTION 2 each 0  . gabapentin (NEURONTIN) 300 MG capsule Take by mouth.    . meclizine (ANTIVERT) 12.5 MG tablet Take 1 tablet (12.5 mg total) by mouth 3 (three) times daily as needed for dizziness. 270 tablet 1  . montelukast (SINGULAIR) 10 MG tablet Take 1 tablet (10 mg total) by mouth at bedtime. 90 tablet 3  . NARCAN 4 MG/0.1ML LIQD nasal spray kit CALL 911. ADMINISTER A SINGLE SPRAY OF NARCAN IN ONE NOSTRIL. REPEAT EVERY 3 MINUTES AS NEEDED IF NO OR MINIMAL RESPONSE.    . nitroGLYCERIN (NITROSTAT) 0.4 MG SL tablet DISSOLVE ONE TABLET UNDER THE TONGUE EVERY 5 MINUTES AS NEEDED FOR CHEST PAIN.  DO  NOT EXCEED A TOTAL OF 3 DOSES IN 15 MINUTES 25 tablet 0  . pneumococcal 23 valent vaccine (PNEUMOVAX 23) 25 MCG/0.5ML injection Pneumovax-23 25 mcg/0.5 mL injection syringe  ADM 0.5ML IM UTD    . rosuvastatin (CRESTOR) 20 MG tablet Take 1 tablet (20 mg total) by mouth daily. 90 tablet 3  . valACYclovir (VALTREX) 1000 MG tablet TAKE 1 TABLET BY MOUTH once daily  270 tablet 3   No current facility-administered medications for this visit.    Allergies: Bee venom, Codeine, Ibuprofen, Clonidine derivatives, Statins, Tramadol, Latex, Levofloxacin, and Propoxyphene n-acetaminophen  Past Medical History:  Diagnosis Date  . Acute MI ([place])    x3 - by report only. She has had 3 negative Myoview stress test with no evidence of prior infarct.  . Allergic rhinitis   . Allergy   . Anemia   . Ankle fracture 05/2016  . Anxiety   . Arthritis   . Asthma   . Barrett esophagus 2007  . Chest pain 05-02-2009   echo  EF 55%  . COPD (chronic obstructive pulmonary disease) with chronic bronchitis ([facility])    & Emphysema  . Depression   . Duodenitis without mention of hemorrhage 2007  . Esophageal reflux 2007  . Esophageal stricture   . Full dentures   . H/O hiatal hernia   . Hiatal hernia 8413,2440  . Hyperlipidemia   . Multiple fractures    from falls, fx rt. elbow, fx left wrist, bilateral ankles  . Pneumonia 10/2016  . Restrictive lung disease 06/30/2017  . Ulcer     Past Surgical History:  Procedure Laterality Date  . ABDOMINAL HYSTERECTOMY     BSO  . APPENDECTOMY    . CESAREAN SECTION     x 2  . CHOLECYSTECTOMY    . CHONDROPLASTY Left 01/06/2015   Procedure: CHONDROPLASTY;  Surgeon: Marybelle Killings, MD;  Location: [facility];  Service: Orthopedics;  Laterality: Left;  . COLONOSCOPY    . ELBOW FRACTURE SURGERY     right  . KNEE ARTHROSCOPY WITH EXCISION PLICA Left 09/18/7251   Procedure: KNEE ARTHROSCOPY WITH EXCISION PLICA;  Surgeon: Marybelle Killings, MD;  Location: [facility];  Service: Orthopedics;  Laterality: Left;  . Lower Extremity Arterial Dopplers  07/06/2013   RABI 1.0, LABI 1.1.; No evidence of significant vascular atherogenic plaque  . NECK SURGERY    . NM MYOVIEW LTD  09/2014   LOW RISK. Normal EF of 65% with no regional wall motion abnormalities. No ischemia or infarction.  . TONSILLECTOMY    . WRIST  FRACTURE SURGERY     left, has plate    Family History  Problem Relation Age of Onset  . Emphysema Father   . Dementia Mother   . Diabetes Maternal Grandmother   . Diabetes Maternal Aunt   . Diabetes Other        mat. cousin  . Heart disease Brother   . Colon cancer Neg Hx   . Allergic rhinitis Neg Hx   . Angioedema Neg Hx   . Asthma Neg Hx   . Atopy Neg Hx   . Eczema Neg Hx   . Immunodeficiency Neg Hx   . Urticaria Neg Hx     Social History   Tobacco Use  . Smoking status: Current Every Day Smoker    Packs/day: 0.50    Years: 39.00    Pack years: 19.50    Types: Cigarettes    Start date:  08/06/1977  . Smokeless tobacco: Never Used  Substance Use Topics  . Alcohol use: No    Alcohol/week: 0.0 standard drinks    Subjective:   I connected with [name] on 09/27/20 at 10:40 AM EST by a telephone call and verified that I am speaking with the correct person using two identifiers.   I discussed the limitations of evaluation and management by telemedicine and the availability of in person appointments. The patient expressed understanding and agreed to proceed. Provider in office/ patient is at home; provider and patient are only 2 people on telephone call.   2-3 day history of cough/ congestion/ diarrhea; notes she took a COVID test recently and was negative; does not want to come to the office for PCR testing; notes this is "my normal flu" and asking for cough syrup;  No fever, chest pain or shortness of breath; + body aches; Fully vaccinated for COVID and flu;     Objective:  There were no vitals filed for this visit.  Lungs: Respirations unlabored;  Neurologic: Alert and oriented; speech intact;   Assessment:  1. Viral URI with cough     Plan:  Per patient, she has had negative COVID test in past 2 days; does not want PCR testing; she asked for antibiotics but do not think this is appropriate based on presentation of symptoms; cough syrup called in as  requested and she can take OTC Imodium as needed; increase fluids, rest and follow-up worse, no better. She understands to remain at home until she is symptom free and continue to wear a mask when out in public.  Time spent 11 minutes  No follow-ups on file.  No orders of the defined types were placed in this encounter.   Requested Prescriptions   Signed Prescriptions Disp Refills  . promethazine-dextromethorphan (PROMETHAZINE-DM) 6.25-15 MG/5ML syrup 118 mL 0    Sig: Take 5 mLs by mouth 4 (four) times daily as needed for cough.

## 2020-09-29 ENCOUNTER — Ambulatory Visit (INDEPENDENT_AMBULATORY_CARE_PROVIDER_SITE_OTHER): Payer: Medicare Other | Admitting: Orthopedic Surgery

## 2020-09-29 ENCOUNTER — Other Ambulatory Visit: Payer: Self-pay

## 2020-09-29 DIAGNOSIS — R200 Anesthesia of skin: Secondary | ICD-10-CM | POA: Diagnosis not present

## 2020-09-29 DIAGNOSIS — R202 Paresthesia of skin: Secondary | ICD-10-CM

## 2020-09-30 ENCOUNTER — Encounter: Payer: Self-pay | Admitting: Orthopedic Surgery

## 2020-09-30 NOTE — Progress Notes (Signed)
Office Visit Note   Patient: Lori [name]           Date of Birth: Nov 25, 1955           MRN: [id number] Visit Date: 09/29/2020 Requested by: Biagio Borg, MD [place],  [place] [address] PCP: Biagio Borg, MD  Subjective: Chief Complaint  Patient presents with  . Other     EMG/NCV review    HPI: Lori [name] is a 65 y.o. female who presents to the office complaining of continued numbness and tingling in her right hand.  She notes that she has numbness and tingling throughout the entirety of her right hand distal to the wrist.  She notes it "draws up" for 1.5 hours at a time where her hand will contract.  She had a EMG/nerve conduction study by Dr. Ernestina Patches recently that was essentially normal.  She has had similar normal studies in the past of her other extremities..                ROS: All systems reviewed are negative as they relate to the chief complaint within the history of present illness.  Patient denies fevers or chills.  Assessment & Plan: Visit Diagnoses:  1. Numbness and tingling of right hand     Plan: Patient is a 66 year old female who presents for review of EMG/nerve conduction study of the right hand.  She has continued numbness and tingling in her hand as well as episodes where her right hand will contract into a fist that she is concerned about.  No such contraction was observed today in the office.  She does have decree sensation on exam throughout the median, ulnar, radial nerve distributions.  Nerve conduction study was found to be essentially normal which has been the case for multiple of her other studies before on other extremities.  With normal nerve conduction study and continued symptoms, recommended referral to neurology for further work-up.  Patient declined for now and states she would just like to see if the symptoms will resolve or improve on their own.  Recommended she call the office if she would like referral to neurology.  She  agreed with this plan.  Follow-up as needed.  Follow-Up Instructions: No follow-ups on file.   Orders:  No orders of the defined types were placed in this encounter.  No orders of the defined types were placed in this encounter.     Procedures: No procedures performed   Clinical Data: No additional findings.  Objective: Vital Signs: There were no vitals taken for this visit.  Physical Exam:  Constitutional: Patient appears well-developed HEENT:  Head: Normocephalic Eyes:EOM are normal Neck: Normal range of motion Cardiovascular: Normal rate Pulmonary/chest: Effort normal Neurologic: Patient is alert Skin: Skin is warm Psychiatric: Patient has normal mood and affect  Ortho Exam: Ortho exam demonstrates decree sensation in the right hand compared to the contralateral side in median, ulnar, radial distributions.  Equivalent grip strength and finger abduction compared with the left hand.  Tenderness diffusely throughout the right hand and fingers with no area of maximal tenderness.  Specialty Comments:  No specialty comments available.  Imaging: No results found.   PMFS History: Patient Active Problem List   Diagnosis Date Noted  . Genital herpes 11/12/2019  . Coccyx pain 11/12/2019  . Insomnia 11/12/2019  . Acute right-sided low back pain without sciatica 07/20/2019  . Grief 07/20/2019  . Vitamin D deficiency 07/20/2019  . Hyperglycemia 04/21/2019  .  Left knee pain 04/21/2019  . Pain 12/29/2018  . B12 deficiency 10/20/2018  . Bilateral hand pain 08/03/2018  . Nausea 04/27/2018  . Anemia, iron deficiency 04/27/2018  . Intermittent paresthesia of hand and foot 04/27/2018  . Headache 03/21/2018  . Lumbar radiculitis 08/15/2017  . Degenerative disc disease, lumbar 07/21/2017  . Encounter for well adult exam with abnormal findings 06/30/2017  . Epigastric pain 06/30/2017  . Restrictive lung disease 06/30/2017  . Leg cramping 06/09/2017  . Elbow contusion  06/09/2017  . Right elbow pain 05/27/2017  . Asthma-COPD overlap syndrome ([place]) 01/31/2017  . Dizziness 01/20/2017  . Seasonal and perennial allergic rhinitis 01/01/2017  . Moderate persistent asthma, uncomplicated [id number]  . Pulmonary emphysema ([place]) 12/03/2016  . Tobacco use disorder 12/03/2016  . Chronic seasonal allergic rhinitis 12/03/2016  . Medication management 11/29/2016  . Cough 11/23/2016  . Rash 11/23/2016  . Vertigo 10/22/2016  . Gastroesophageal reflux disease with esophagitis 10/22/2016  . Non compliance w medication regimen 08/01/2016  . Allergic contact dermatitis due to metals 08/01/2016  . Migraine without aura and without status migrainosus, not intractable 07/22/2016  . Spasm of muscle of lower back 06/21/2016  . Chronic rhinitis 06/19/2016  . Gait difficulty 06/21/2015  . Fibromyalgia 06/21/2015  . Conversion reaction 06/21/2015  . Depression 06/21/2015  . Syncope and collapse 06/21/2015  . Plica syndrome of left knee 01/06/2015  . Anxiety state 09/06/2014  . History of MI 09/06/2014  . Cigarette smoker 06/27/2013  . Borderline hypertension 06/27/2013  . Chronic low back pain with sciatica 06/14/2013  . Leg pain, bilateral 06/14/2013  . Gastroparesis 10/26/2009  . Musculoskeletal chest pain 05/02/2009  . Hyperlipidemia LDL goal <100 11/18/2008  . COPD exacerbation ([place]) 11/18/2008  . HEPATIC CYST 11/18/2008   Past Medical History:  Diagnosis Date  . Acute MI ([place])    x3 - by report only. She has had 3 negative Myoview stress test with no evidence of prior infarct.  . Allergic rhinitis   . Allergy   . Anemia   . Ankle fracture 05/2016  . Anxiety   . Arthritis   . Asthma   . Barrett esophagus 2007  . Chest pain 05-02-2009   echo  EF 55%  . COPD (chronic obstructive pulmonary disease) with chronic bronchitis (HCC)    & Emphysema  . Depression   . Duodenitis without mention of hemorrhage 2007  . Esophageal reflux 2007  . Esophageal stricture    . Full dentures   . H/O hiatal hernia   . Hiatal hernia [id number]  . Hyperlipidemia   . Multiple fractures    from falls, fx rt. elbow, fx left wrist, bilateral ankles  . Pneumonia 10/2016  . Restrictive lung disease 06/30/2017  . Ulcer     Family History  Problem Relation Age of Onset  . Emphysema Father   . Dementia Mother   . Diabetes Maternal Grandmother   . Diabetes Maternal Aunt   . Diabetes Other        mat. cousin  . Heart disease Brother   . Colon cancer Neg Hx   . Allergic rhinitis Neg Hx   . Angioedema Neg Hx   . Asthma Neg Hx   . Atopy Neg Hx   . Eczema Neg Hx   . Immunodeficiency Neg Hx   . Urticaria Neg Hx     Past Surgical History:  Procedure Laterality Date  . ABDOMINAL HYSTERECTOMY     BSO  . APPENDECTOMY    .  CESAREAN SECTION     x 2  . CHOLECYSTECTOMY    . CHONDROPLASTY Left 01/06/2015   Procedure: CHONDROPLASTY;  Surgeon: Marybelle Killings, MD;  Location: [facility];  Service: Orthopedics;  Laterality: Left;  . COLONOSCOPY    . ELBOW FRACTURE SURGERY     right  . KNEE ARTHROSCOPY WITH EXCISION PLICA Left 03/05/3558   Procedure: KNEE ARTHROSCOPY WITH EXCISION PLICA;  Surgeon: Marybelle Killings, MD;  Location: [facility];  Service: Orthopedics;  Laterality: Left;  . Lower Extremity Arterial Dopplers  07/06/2013   RABI 1.0, LABI 1.1.; No evidence of significant vascular atherogenic plaque  . NECK SURGERY    . NM MYOVIEW LTD  09/2014   LOW RISK. Normal EF of 65% with no regional wall motion abnormalities. No ischemia or infarction.  . TONSILLECTOMY    . WRIST FRACTURE SURGERY     left, has plate   Social History   Occupational History  . Occupation: disable  Tobacco Use  . Smoking status: Current Every Day Smoker    Packs/day: 0.50    Years: 39.00    Pack years: 19.50    Types: Cigarettes    Start date: 08/06/1977  . Smokeless tobacco: Never Used  Vaping Use  . Vaping Use: Never used  Substance and Sexual Activity   . Alcohol use: No    Alcohol/week: 0.0 standard drinks  . Drug use: No  . Sexual activity: Not Currently

## 2020-10-03 ENCOUNTER — Telehealth: Payer: Self-pay

## 2020-10-03 NOTE — Telephone Encounter (Signed)
Patient calling to see if she could get her allergy shots on Jan. 31, 2022 Last shot was given 03/21/2020 0.5 of her red vials please advise on this

## 2020-10-03 NOTE — Telephone Encounter (Signed)
Let's back down to the [employer] 0.2 and advance on Schedule B until we get to Red Vial 0.5 mL.   Salvatore Marvel, MD [facility]

## 2020-10-04 NOTE — Telephone Encounter (Signed)
Correction last injection was given 08/15/2020 at 0.5 of Red. Please advise on dose, overlooked from yesterday?

## 2020-10-04 NOTE — Telephone Encounter (Signed)
Gotcha - let's back down to Red Vial 0.025 mL and advance on Schedule B.  Salvatore Marvel, MD [facility]

## 2020-10-04 NOTE — Telephone Encounter (Signed)
Patient called office back and was told about getting her allergy injections and biologic injections together.

## 2020-10-04 NOTE — Telephone Encounter (Signed)
Patient is getting Lori [name] injection coming up and is okay to get injections same time as [place].

## 2020-10-09 ENCOUNTER — Telehealth: Payer: Self-pay

## 2020-10-09 ENCOUNTER — Telehealth: Payer: Self-pay | Admitting: Internal Medicine

## 2020-10-09 NOTE — Telephone Encounter (Signed)
This is high suspicion for COVID illness  Please ask pt to schedule testing done by going online for testing to [email or web address], [email or web address], [email or web address], or [email or web address]  If unable to go online, pt should physically go to any Urgent Care for testing including [facility] for immodium prn diarrhea for now, and rest, tylenol, and fluids

## 2020-10-09 NOTE — Telephone Encounter (Signed)
Left pt voicemail to return office call in regards to her recent call

## 2020-10-09 NOTE — Telephone Encounter (Signed)
Patient returned call. She can be reached at [phone].

## 2020-10-09 NOTE — Telephone Encounter (Signed)
Schedule pt for a virtual visit

## 2020-10-09 NOTE — Telephone Encounter (Signed)
[facility]   Pt has been sick for 2 weeks. She is sneezing, coughing, having watery eyes and having diarrhea. Possible fever but no thermometer  [facility] advised: Go to ED now  Patient understood but disagreed due to lack of transportation.   Please advise.

## 2020-10-10 ENCOUNTER — Telehealth (INDEPENDENT_AMBULATORY_CARE_PROVIDER_SITE_OTHER): Payer: Medicare Other | Admitting: Internal Medicine

## 2020-10-10 ENCOUNTER — Other Ambulatory Visit: Payer: Self-pay

## 2020-10-10 DIAGNOSIS — F411 Generalized anxiety disorder: Secondary | ICD-10-CM | POA: Diagnosis not present

## 2020-10-10 DIAGNOSIS — J449 Chronic obstructive pulmonary disease, unspecified: Secondary | ICD-10-CM | POA: Diagnosis not present

## 2020-10-10 DIAGNOSIS — R059 Cough, unspecified: Secondary | ICD-10-CM

## 2020-10-10 DIAGNOSIS — R739 Hyperglycemia, unspecified: Secondary | ICD-10-CM

## 2020-10-10 MED ORDER — AZITHROMYCIN 250 MG PO TABS: ORAL_TABLET | ORAL | 1 refills | Status: DC

## 2020-10-10 MED ORDER — HYDROCODONE-HOMATROPINE 5-1.5 MG/5ML PO SYRP
5.0000 mL | ORAL_SOLUTION | Freq: Four times a day (QID) | ORAL | 0 refills | Status: DC | PRN
Start: 2020-10-10 — End: 2020-10-11

## 2020-10-10 MED ORDER — DIPHENOXYLATE-ATROPINE 2.5-0.025 MG PO TABS: 1.0000 | ORAL_TABLET | Freq: Four times a day (QID) | ORAL | 0 refills | Status: DC | PRN

## 2020-10-10 NOTE — Progress Notes (Signed)
Patient ID: Lori [name], female   DOB: Feb 27, 1956, 65 y.o.   MRN: [id number]  Virtual Visit via Video Note  I connected with [name] on 10/10/20 at  2:40 PM EST by a video enabled telemedicine application and verified that I am speaking with the correct person using two identifiers.  Location of all participants today Patient: at home Provider: at office   I discussed the limitations of evaluation and management by telemedicine and the availability of in person appointments. The patient expressed understanding and agreed to proceed.  History of Present Illness: Here with c/o 8 days onset ill symptoms of cough, congestion, feels warm, sneezing, nausea and several loose stools without fever, vomiting, sob, chills.  Pt tested neg for covid yesterday, but is concerned for other infection such as pneumonia  No sick contacts she is aware.  Pt denies chest pain, increased sob or doe, wheezing, orthopnea, PND, increased LE swelling, palpitations, dizziness or syncope.   Pt denies polydipsia, polyuria,  Recent allergy symptoms has been controlled. Denies worsening depressive symptoms, suicidal ideation, or panic; has ongoing anxiety Past Medical History:  Diagnosis Date  . Acute MI ([place])    x3 - by report only. She has had 3 negative Myoview stress test with no evidence of prior infarct.  . Allergic rhinitis   . Allergy   . Anemia   . Ankle fracture 05/2016  . Anxiety   . Arthritis   . Asthma   . Barrett esophagus 2007  . Chest pain 05-02-2009   echo  EF 55%  . COPD (chronic obstructive pulmonary disease) with chronic bronchitis (HCC)    & Emphysema  . Depression   . Duodenitis without mention of hemorrhage 2007  . Esophageal reflux 2007  . Esophageal stricture   . Full dentures   . H/O hiatal hernia   . Hiatal hernia [id number]  . Hyperlipidemia   . Multiple fractures    from falls, fx rt. elbow, fx left wrist, bilateral ankles  . Pneumonia 10/2016  . Restrictive lung  disease 06/30/2017  . Ulcer    Past Surgical History:  Procedure Laterality Date  . ABDOMINAL HYSTERECTOMY     BSO  . APPENDECTOMY    . CESAREAN SECTION     x 2  . CHOLECYSTECTOMY    . CHONDROPLASTY Left 01/06/2015   Procedure: CHONDROPLASTY;  Surgeon: Marybelle Killings, MD;  Location: [facility];  Service: Orthopedics;  Laterality: Left;  . COLONOSCOPY    . ELBOW FRACTURE SURGERY     right  . KNEE ARTHROSCOPY WITH EXCISION PLICA Left 12/08/4980   Procedure: KNEE ARTHROSCOPY WITH EXCISION PLICA;  Surgeon: Marybelle Killings, MD;  Location: [facility];  Service: Orthopedics;  Laterality: Left;  . Lower Extremity Arterial Dopplers  07/06/2013   RABI 1.0, LABI 1.1.; No evidence of significant vascular atherogenic plaque  . NECK SURGERY    . NM MYOVIEW LTD  09/2014   LOW RISK. Normal EF of 65% with no regional wall motion abnormalities. No ischemia or infarction.  . TONSILLECTOMY    . WRIST FRACTURE SURGERY     left, has plate    reports that she has been smoking cigarettes. She started smoking about 43 years ago. She has a 19.50 pack-year smoking history. She has never used smokeless tobacco. She reports that she does not drink alcohol and does not use drugs. family history includes Dementia in her mother; Diabetes in her maternal aunt, maternal grandmother, and  another family member; Emphysema in her father; Heart disease in her brother. Allergies  Allergen Reactions  . Bee Venom Swelling  . Codeine Nausea Only and Other (See Comments)    CAUSES ULCERS  . Ibuprofen Nausea And Vomiting  . Clonidine Derivatives Other (See Comments)    Unknown   . Statins   . Tramadol Nausea Only  . Latex Rash  . Levofloxacin Nausea Only  . Propoxyphene N-Acetaminophen Nausea Only   Current Outpatient Medications on File Prior to Visit  Medication Sig Dispense Refill  . ALPRAZolam (XANAX) 1 MG tablet TAKE 1 TABLET BY MOUTH AT BEDTIME AS NEEDED FOR ANXIETY 90 tablet 1  .  cyclobenzaprine (FLEXERIL) 5 MG tablet Take 1 tablet (5 mg total) by mouth 3 (three) times daily as needed. for muscle spams 60 tablet 2  . DEXILANT 60 MG capsule TAKE 1 CAPSULE BY MOUTH  DAILY 90 capsule 0  . EPINEPHrine 0.3 mg/0.3 mL IJ SOAJ injection INJECT AS DIRECTED FOR SEVERE ALLERGIC REACTION 2 each 0  . gabapentin (NEURONTIN) 300 MG capsule Take by mouth.    . meclizine (ANTIVERT) 12.5 MG tablet Take 1 tablet (12.5 mg total) by mouth 3 (three) times daily as needed for dizziness. 270 tablet 1  . montelukast (SINGULAIR) 10 MG tablet Take 1 tablet (10 mg total) by mouth at bedtime. 90 tablet 3  . NARCAN 4 MG/0.1ML LIQD nasal spray kit CALL 911. ADMINISTER A SINGLE SPRAY OF NARCAN IN ONE NOSTRIL. REPEAT EVERY 3 MINUTES AS NEEDED IF NO OR MINIMAL RESPONSE.    . nitroGLYCERIN (NITROSTAT) 0.4 MG SL tablet DISSOLVE ONE TABLET UNDER THE TONGUE EVERY 5 MINUTES AS NEEDED FOR CHEST PAIN.  DO NOT EXCEED A TOTAL OF 3 DOSES IN 15 MINUTES 25 tablet 0  . pneumococcal 23 valent vaccine (PNEUMOVAX 23) 25 MCG/0.5ML injection Pneumovax-23 25 mcg/0.5 mL injection syringe  ADM 0.5ML IM UTD    . rosuvastatin (CRESTOR) 20 MG tablet Take 1 tablet (20 mg total) by mouth daily. 90 tablet 3  . valACYclovir (VALTREX) 1000 MG tablet TAKE 1 TABLET BY MOUTH once daily 270 tablet 3   No current facility-administered medications on file prior to visit.    Observations/Objective: Alert, NAD, appropriate mood and affect, resps normal, cn 2-12 intact, moves all 4s, no visible rash or swelling Lab Results  Component Value Date   WBC 8.8 11/12/2019   HGB 12.8 11/12/2019   HCT 38.3 11/12/2019   PLT 279.0 11/12/2019   GLUCOSE 80 11/12/2019   CHOL 246 (H) 11/12/2019   TRIG 388.0 (H) 11/12/2019   HDL 29.40 (L) 11/12/2019   LDLDIRECT 157.0 11/12/2019   LDLCALC Comment 03/12/2017   ALT 9 11/12/2019   AST 9 11/12/2019   NA 138 11/12/2019   K 3.6 11/12/2019   CL 109 11/12/2019   CREATININE 1.07 11/12/2019   BUN 11  11/12/2019   CO2 22 11/12/2019   TSH 1.45 11/12/2019   INR 0.9 08/31/2008   HGBA1C 5.6 11/12/2019   Assessment and Plan: See notes  Follow Up Instructions: See notes   I discussed the assessment and treatment plan with the patient. The patient was provided an opportunity to ask questions and all were answered. The patient agreed with the plan and demonstrated an understanding of the instructions.   The patient was advised to call back or seek an in-person evaluation if the symptoms worsen or if the condition fails to improve as anticipated.   Cathlean Cower, MD

## 2020-10-11 ENCOUNTER — Telehealth: Payer: Self-pay | Admitting: Internal Medicine

## 2020-10-11 MED ORDER — GUAIFENESIN-DM 100-10 MG/5ML PO SYRP: 5.0000 mL | ORAL_SOLUTION | ORAL | 1 refills | Status: DC | PRN

## 2020-10-11 NOTE — Telephone Encounter (Signed)
HYDROcodone-homatropine (HYCODAN) 5-1.5 MG/5ML syrup Patient states this medication is 52 dollars and she cannot afford that, wondering if she can be prescribed a different cough medication

## 2020-10-11 NOTE — Telephone Encounter (Signed)
Ok for robitussin DM -= done erx 

## 2020-10-12 ENCOUNTER — Other Ambulatory Visit: Payer: Self-pay

## 2020-10-12 DIAGNOSIS — G43719 Chronic migraine without aura, intractable, without status migrainosus: Secondary | ICD-10-CM | POA: Diagnosis not present

## 2020-10-12 NOTE — Telephone Encounter (Signed)
Patient notified to pick up Robitussin.

## 2020-10-15 ENCOUNTER — Encounter: Payer: Self-pay | Admitting: Internal Medicine

## 2020-10-15 NOTE — Assessment & Plan Note (Signed)
O/w stable, no indication for steroid tx at this time,  to f/u any worsening symptoms or concerns

## 2020-10-15 NOTE — Patient Instructions (Signed)
Please take all new medication as prescribed 

## 2020-10-15 NOTE — Assessment & Plan Note (Signed)
Lab Results  Component Value Date   HGBA1C 5.6 11/12/2019   Stable, pt to continue current medical treatment  - diet

## 2020-10-15 NOTE — Assessment & Plan Note (Signed)
Mild worsening situational, pt reassured

## 2020-10-15 NOTE — Assessment & Plan Note (Signed)
Mild to mod, cant r/o bronchitis vs pna, for antibx course, cough med prn,  to f/u any worsening symptoms or concerns, and I also recommended repeat covid testing to be sure

## 2020-10-16 ENCOUNTER — Other Ambulatory Visit: Payer: Self-pay

## 2020-10-16 ENCOUNTER — Ambulatory Visit (INDEPENDENT_AMBULATORY_CARE_PROVIDER_SITE_OTHER): Payer: Medicare Other

## 2020-10-16 DIAGNOSIS — J4550 Severe persistent asthma, uncomplicated: Secondary | ICD-10-CM | POA: Diagnosis not present

## 2020-10-16 DIAGNOSIS — J309 Allergic rhinitis, unspecified: Secondary | ICD-10-CM

## 2020-10-16 MED ORDER — BENRALIZUMAB 30 MG/ML ~~LOC~~ SOSY
30.0000 mg | PREFILLED_SYRINGE | SUBCUTANEOUS | Status: DC
Start: 2020-10-16 — End: 2024-01-19
  Administered 2020-10-16 – 2023-12-22 (×21): 30 mg via SUBCUTANEOUS

## 2020-10-20 ENCOUNTER — Other Ambulatory Visit: Payer: Self-pay | Admitting: Internal Medicine

## 2020-10-25 ENCOUNTER — Ambulatory Visit (INDEPENDENT_AMBULATORY_CARE_PROVIDER_SITE_OTHER): Payer: Medicare Other | Admitting: *Deleted

## 2020-10-25 DIAGNOSIS — J309 Allergic rhinitis, unspecified: Secondary | ICD-10-CM | POA: Diagnosis not present

## 2020-10-25 IMAGING — MR MR CERVICAL SPINE W/O CM
4 of 5 series · 25 of 48 positions shown · IV contrast (Yes)
Comparison: CT cervical spine 04/07/2015

CLINICAL DATA: Cervicalgia.  Cervical fusion.

EXAM:
MRI CERVICAL SPINE WITHOUT CONTRAST
TECHNIQUE: Multiplanar, multisequence MR imaging of the cervical spine was
performed. No intravenous contrast was administered.

[Series 2: T1 · sagittal · 3.0mm · 0.41mm/px · 6 of 14 slices shown]
[im 1/14]
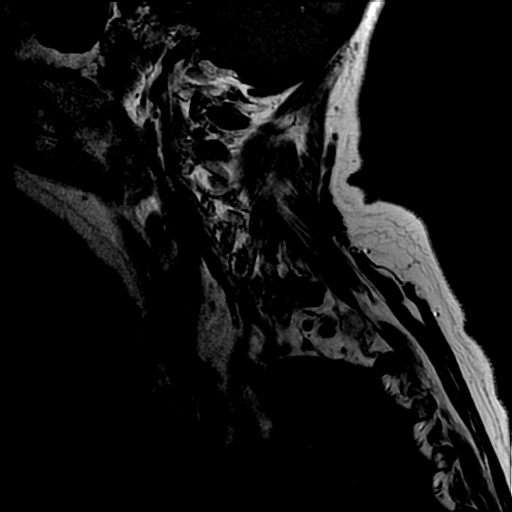
[im 3/14]
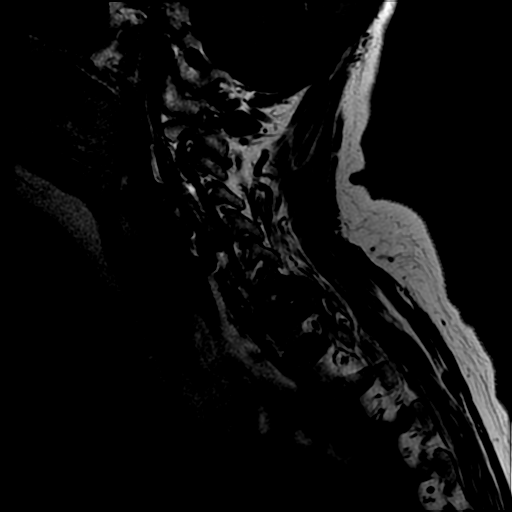
[im 6/14]
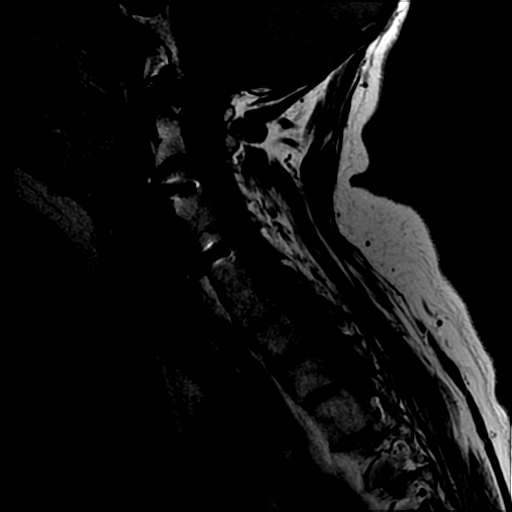
[im 8/14]
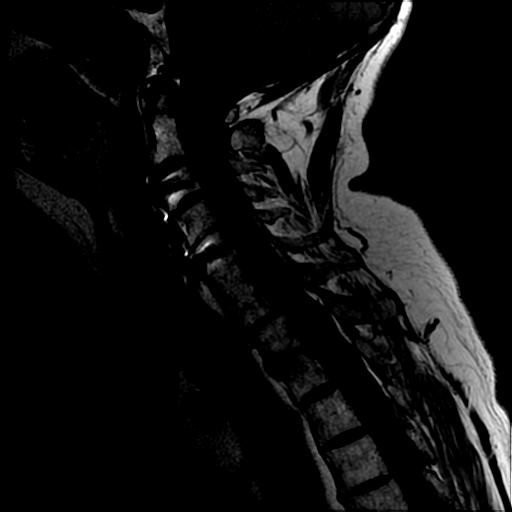
[im 11/14]
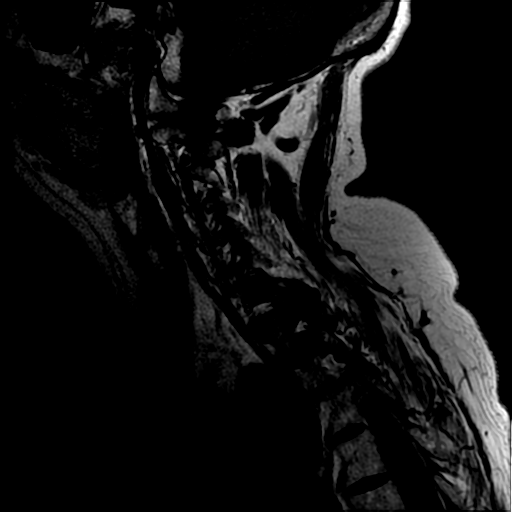
[im 14/14]
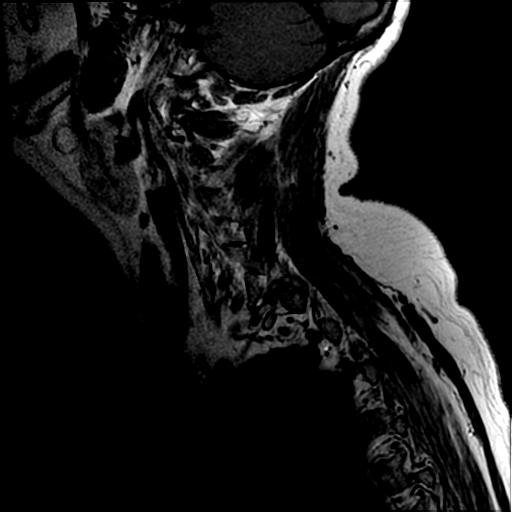

[Series 3: sag ir · sagittal · 3.0mm · 0.41mm/px · 4 of 14 slices shown]
[im 1/14]
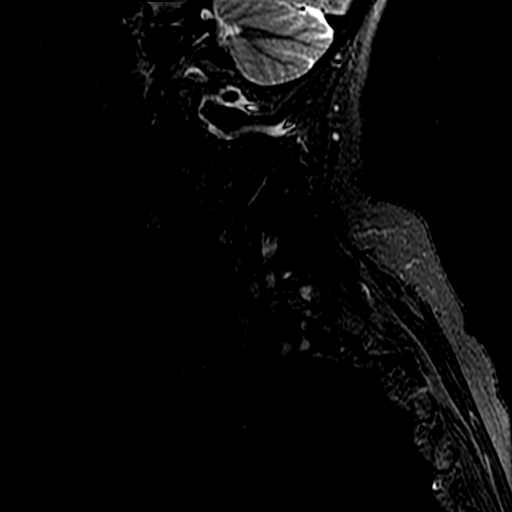
[im 3/14]
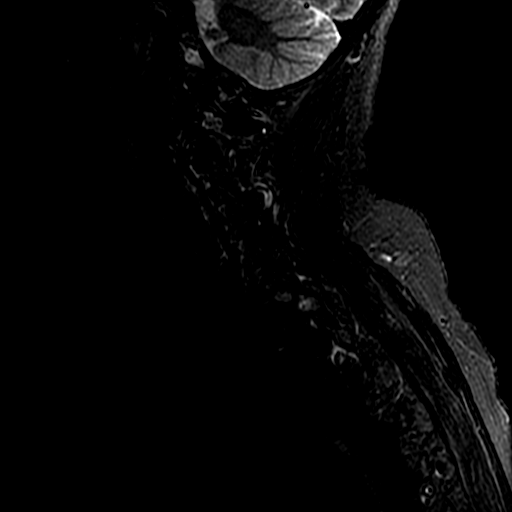
[im 7/14]
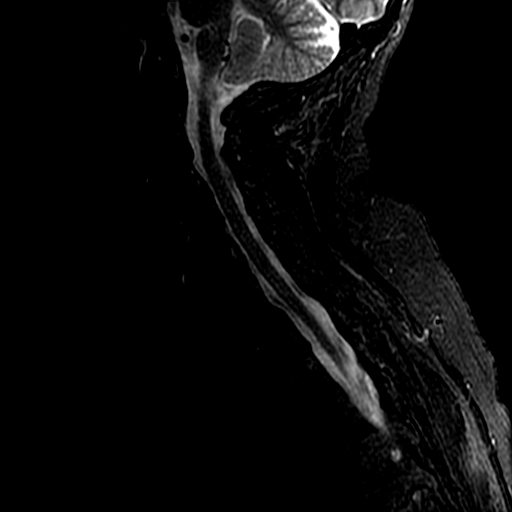
[im 11/14]
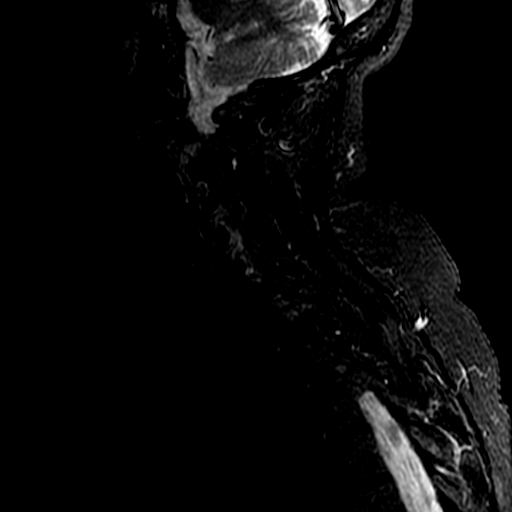

[Series 4: T2 post-contrast · sagittal · 3.0mm · 0.41mm/px · 7 of 14 slices shown]
[im 1/14]
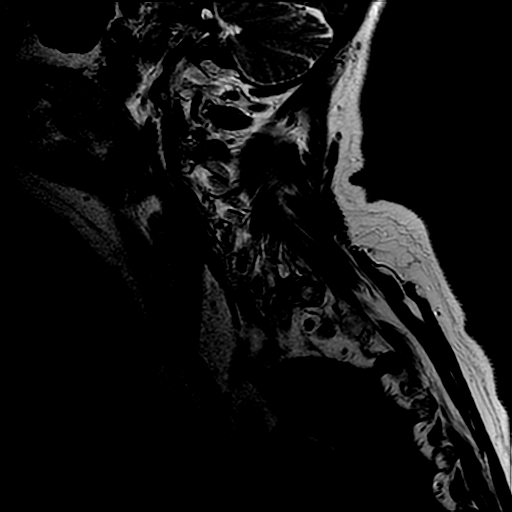
[im 3/14]
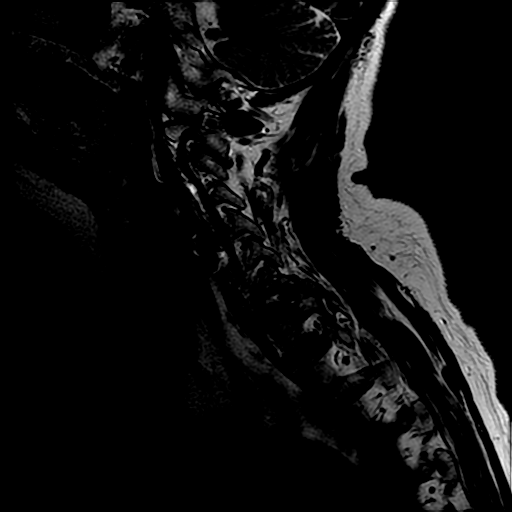
[im 5/14]
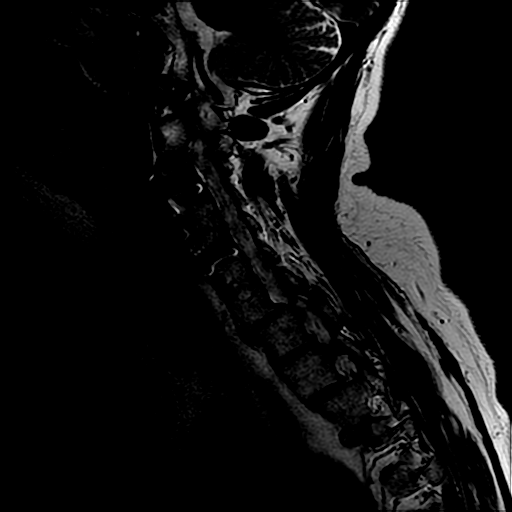
[im 7/14]
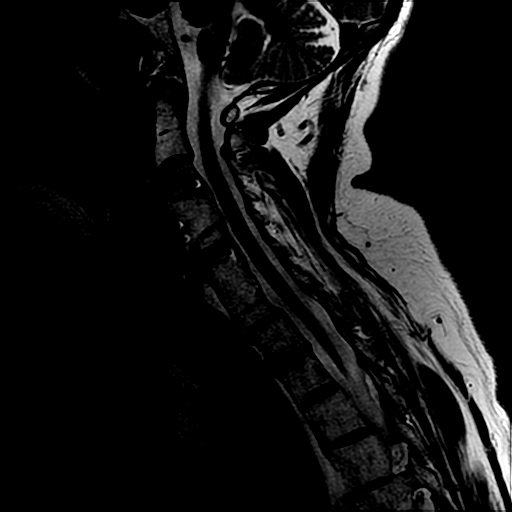
[im 9/14]
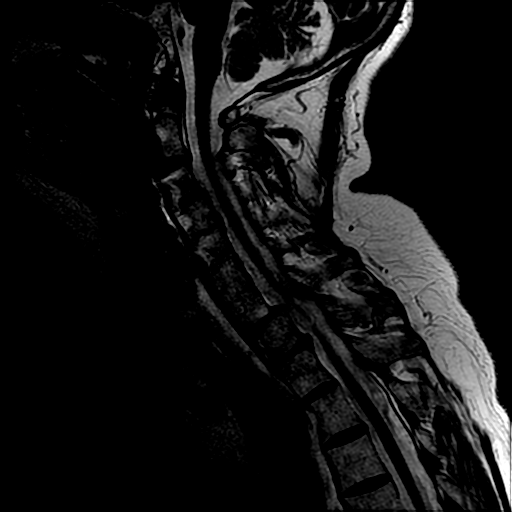
[im 11/14]
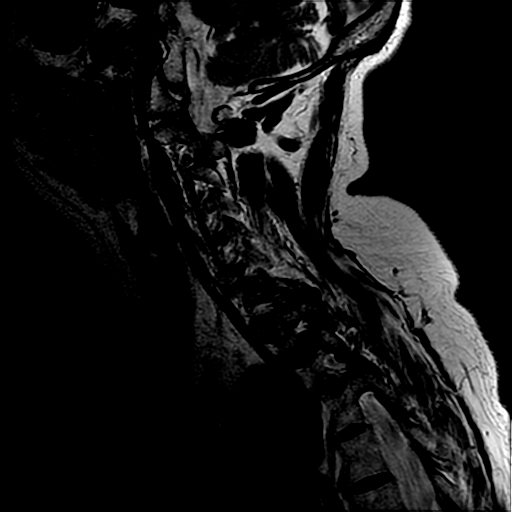
[im 14/14]
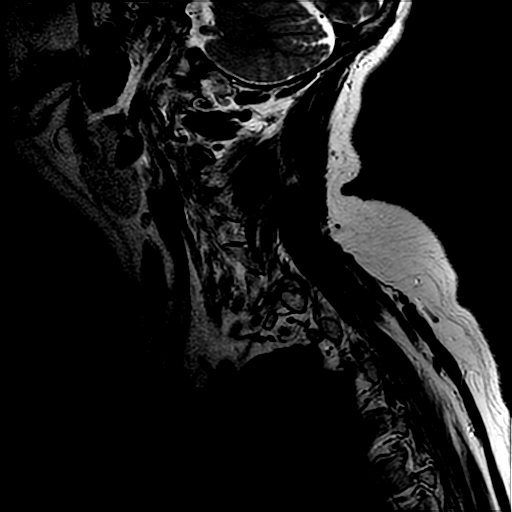

[Series 6: T2 · axial · 3.0mm · 0.70mm/px · z∈[-59,+31]mm · 8 of 31 slices shown]
[im 1/31]
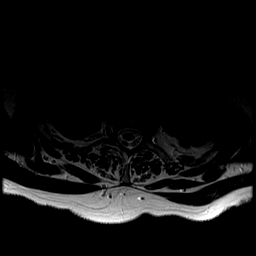
[im 5/31]
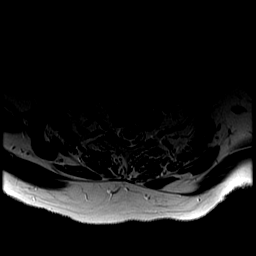
[im 10/31]
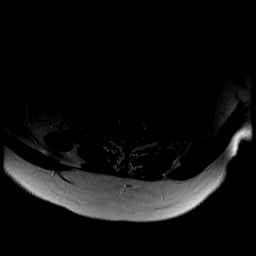
[im 14/31]
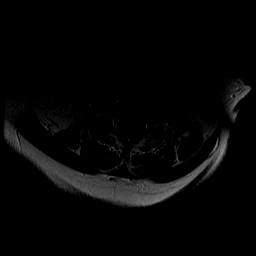
[im 17/31]
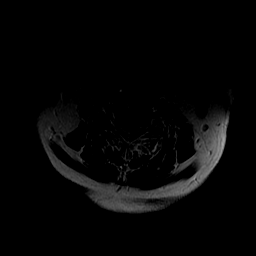
[im 21/31]
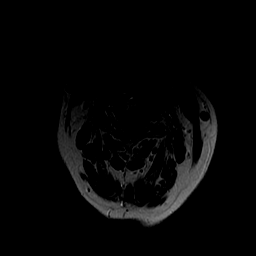
[im 26/31]
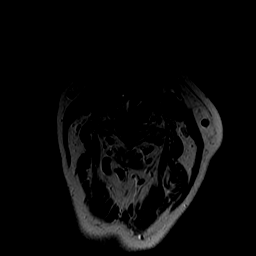
[im 31/31]
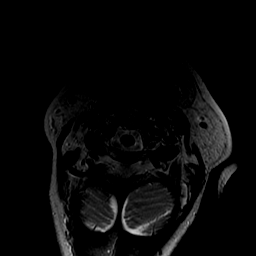

[25 of 48 positions shown; findings below may reference images not displayed]

FINDINGS: Alignment: Mild retrolisthesis C2-3, not seen on the prior CT. 3 mm
anterolisthesis C7-T1 has progressed since the prior CT.

Vertebrae: Negative for fracture or mass. ACDF with solid fusion C3
through C7. Anterior plate C3 through C5.

Cord: Normal cord signal.  No cord compression

Posterior Fossa, vertebral arteries, paraspinal tissues: Negative

Disc levels:

C2-3: Disc and facet degeneration.  Negative for stenosis

C3-4: ACDF with solid fusion.  Negative for stenosis

C4-5: ACDF with solid fusion.  Negative for stenosis

C5-6: ACDF with solid fusion.  Negative for stenosis

C6-7: ACDF with solid fusion.  Negative for stenosis

C7-T1: 3 mm anterolisthesis. Bilateral facet degeneration. Mild
foraminal narrowing bilaterally

T1-2: Disc degeneration and spurring on the left. Mild left
foraminal narrowing.
IMPRESSION: ACDF with solid fusion C3 through C7 without significant stenosis

3 mm anterolisthesis C7-T1. Mild foraminal narrowing bilaterally.
Mild left foraminal narrowing due to spurring at T1-2

## 2020-10-25 IMAGING — MR MR LUMBAR SPINE W/O CM
4 of 5 series · 19 of 48 positions shown · non-contrast
Comparison: MRI lumbar spine 01/18/2016.

CLINICAL DATA: Chronic low back pain.  No known injury.

EXAM:
MRI LUMBAR SPINE WITHOUT CONTRAST
TECHNIQUE: Multiplanar, multisequence MR imaging of the lumbar spine was
performed. No intravenous contrast was administered.

[Series 3: T1 · sagittal · 4.0mm · 0.51mm/px · 3 of 14 slices shown (1 of 2)]
[im 3/14]
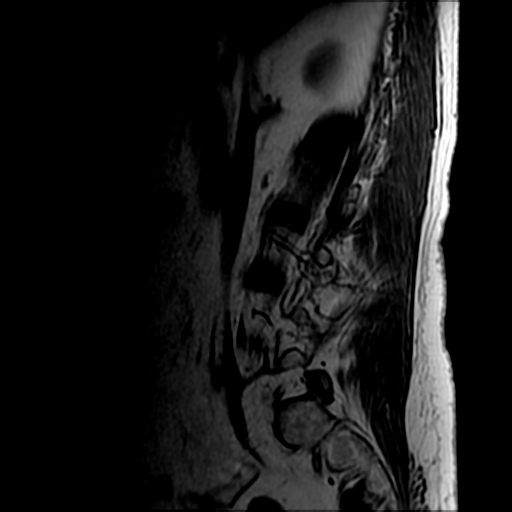
[im 8/14]
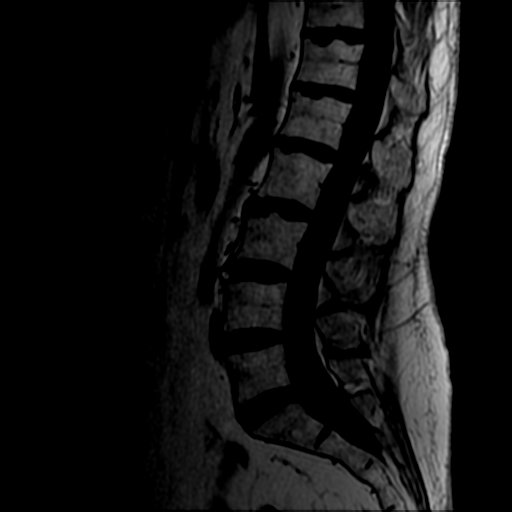
[im 14/14]
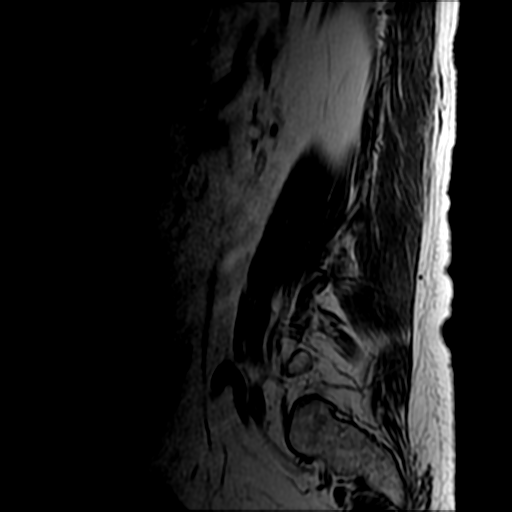

[Series 4: T2 post-contrast · sagittal · 4.0mm · 0.51mm/px · 6 of 14 slices shown]
[im 1/14]
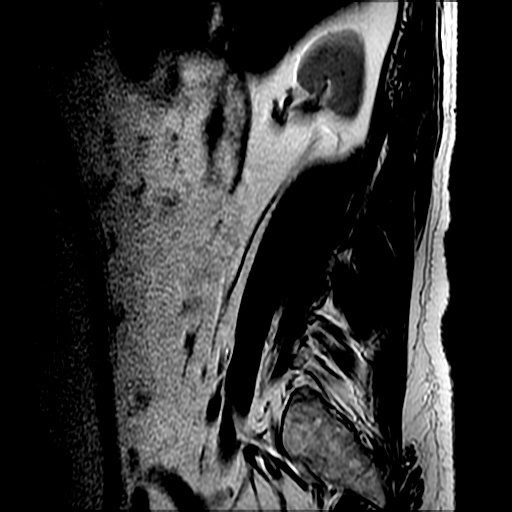
[im 3/14]
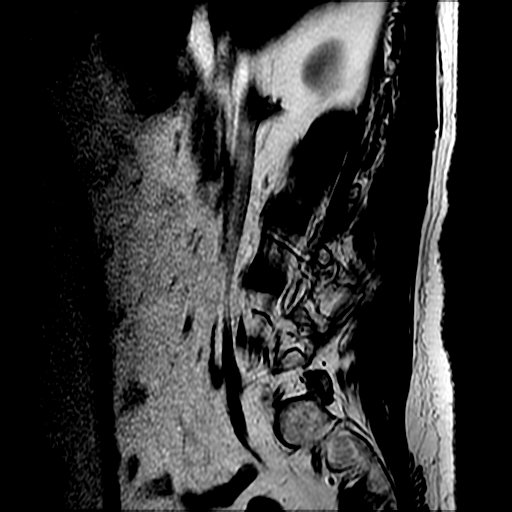
[im 6/14]
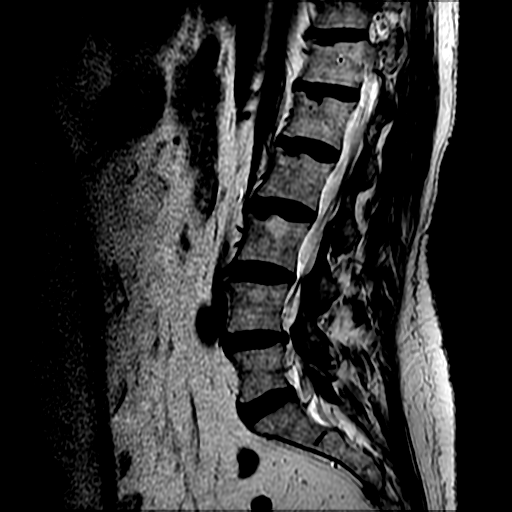
[im 8/14]
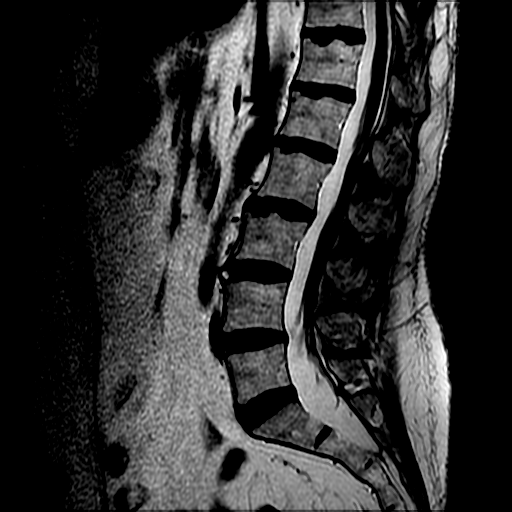
[im 11/14]
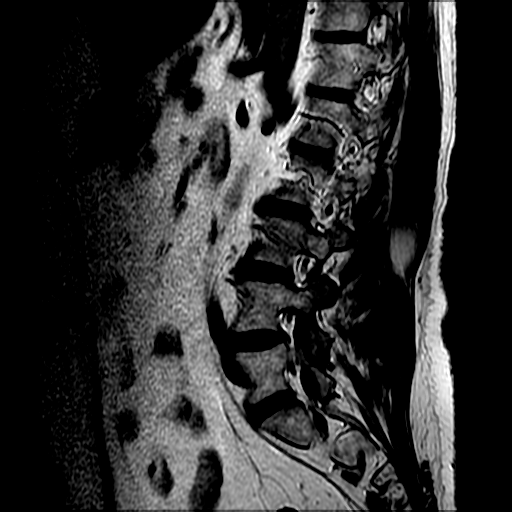
[im 14/14]
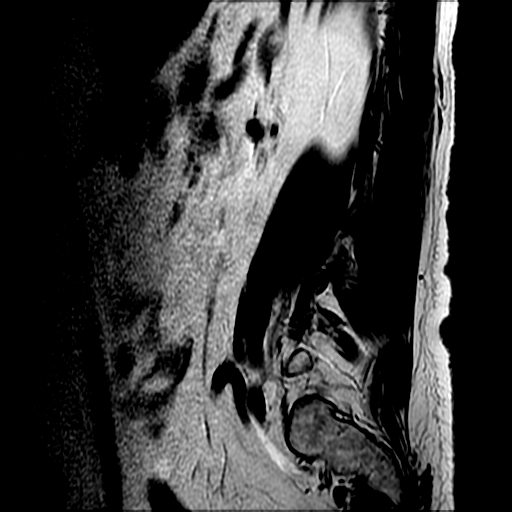

[Series 6: T2 · axial · 4.0mm · 0.39mm/px · z∈[-437,-268]mm · 7 of 35 slices shown]
[im 1/35]
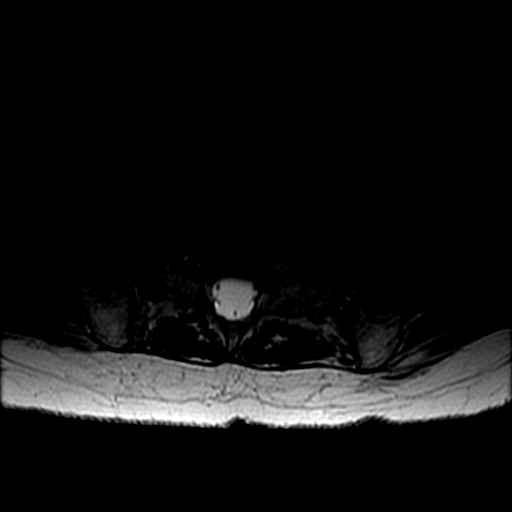
[im 5/35]
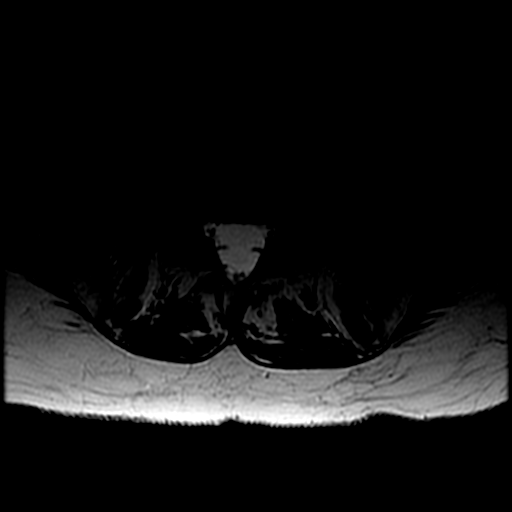
[im 10/35]
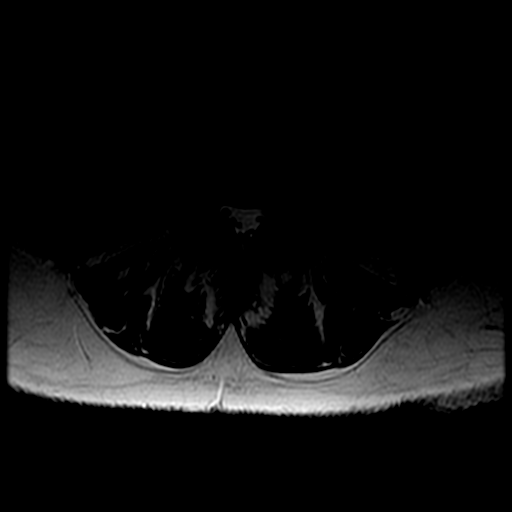
[im 15/35]
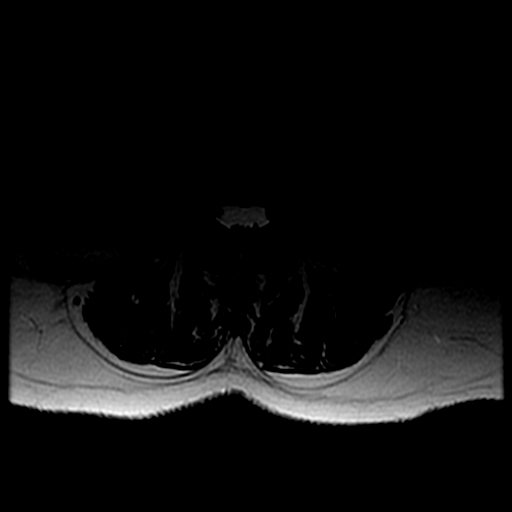
[im 18/35]
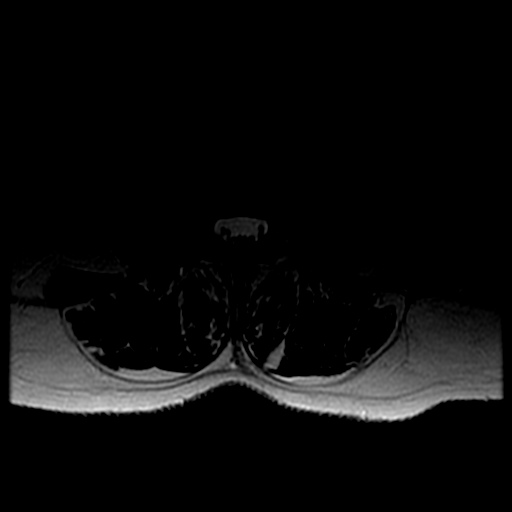
[im 20/35]
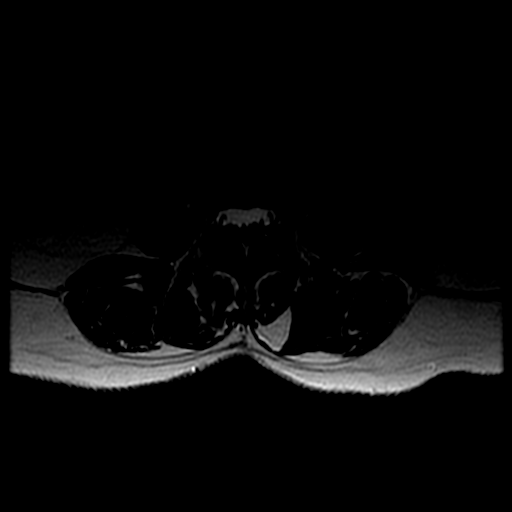
[im 30/35]
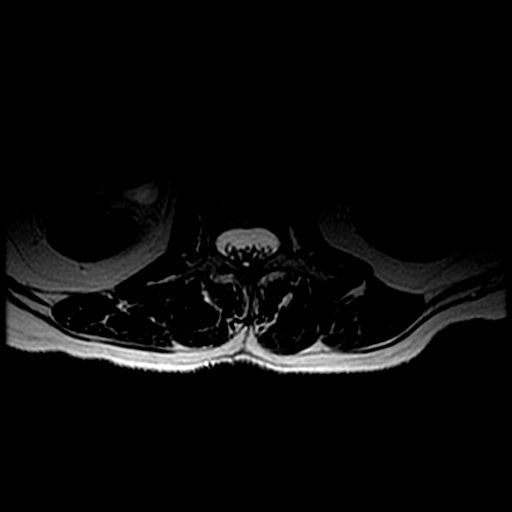

[Series 7: T1 · axial · 4.0mm · 0.39mm/px · z∈[-417,-268]mm · 3 of 35 slices shown (2 of 2)]
[im 5/35]
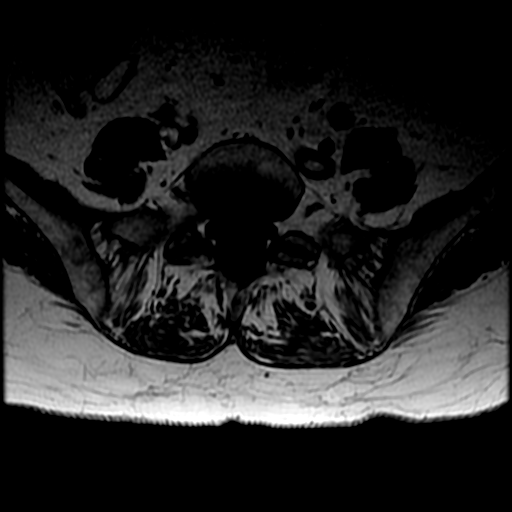
[im 18/35]
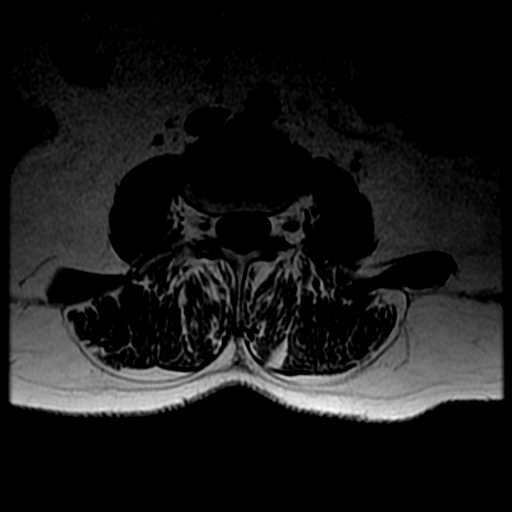
[im 30/35]
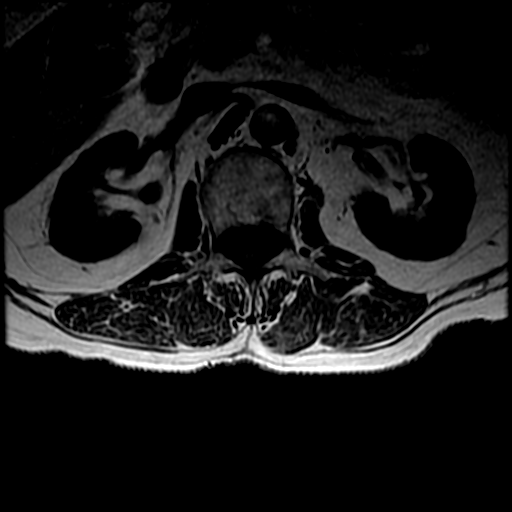

[19 of 48 positions shown; findings below may reference images not displayed]

FINDINGS: Segmentation:  Standard.

Alignment:  Normal.

Vertebrae: No fracture or worrisome lesion. A few small Schmorl's
nodes are noted.

Conus medullaris and cauda equina: Conus extends to the L1 level.
Conus and cauda equina appear normal.

Paraspinal and other soft tissues: Negative.

Disc levels:

T11-12 is imaged in the sagittal plane only and negative.

T12-L1: Negative.

L1-2: Negative.

L2-3: Annular fissure and tiny central protrusion. No stenosis. No
change compared to the prior exam.

L3-4: Negative.

L4-5: Mild-to-moderate facet degenerative change and a shallow disc
bulge. No stenosis.

L5-S1: Negative.
IMPRESSION: No change in mild degenerative disease of the lumbar spine at L2-3
and L4-5. The central canal and foramina are widely patent at all
levels.

## 2020-10-30 ENCOUNTER — Other Ambulatory Visit: Payer: Self-pay | Admitting: Allergy & Immunology

## 2020-10-30 NOTE — Telephone Encounter (Signed)
Pt. Has not been seen since April of 2021 but gets allergy shots regularly

## 2020-11-01 NOTE — Progress Notes (Signed)
[address] [place] [address] Dept: [phone]  FOLLOW UP NOTE  Patient ID: Lori [name], female    DOB: 11/20/1955  Age: 65 y.o. MRN: [id number] Date of Office Visit: 11/02/2020  Assessment  Chief Complaint: Asthma (ACT - 11 would like to talk about getting an inhaler/ rescue inhaler ) and Allergic Rhinitis  (Only has flares around the time of next immunotherapy shot )  HPI Lori [name] is a 65 year old female who presents to the clinic for follow-up visit.  She was last seen in this clinic on 08/04/2018 by Dr. Ernst Bowler for evaluation of allergic rhinitis, asthma with COPD overlap, poor compliance, and right acute otitis media.  At today's visit she reports asthma has been moderately well controlled with no shortness of breath or wheeze with activity or rest.  She does report cough which is combination of dry and producing thick clear mucus.  She is currently out of montelukast 10 mg, Dulera 200, and Xopenex.  She reports she has been out of these medications for several months.  She does continue Fasenra once every 4 weeks with no local reaction.  She reports Berna Bue is providing some relief of asthma symptoms.  Allergic rhinitis is reported as poorly controlled with the main symptom of thick postnasal drainage causing throat clearing and cough.  She is not currently using Flonase, saline nasal rinse, or cetirizine.  She continues allergy injections directed toward mold, cat, and dog with no local large reactions.  She reports a significant improvement in her symptoms of allergic rhinitis while continuing on allergen immunotherapy.  Reflux is reported as well controlled with Dexilant 60 mg once a day.  She continues smoking " a couple of cigarettes" a day and is currently trying to cut down and quit.  She does report that she had large local swelling as well as throat tightness following a bee sting that was longer than 10 years ago.  She does not currently have an EpiPen.   Her current medications are listed in the chart.   Drug Allergies:  Allergies  Allergen Reactions  . Bee Venom Swelling  . Codeine Nausea Only and Other (See Comments)    CAUSES ULCERS  . Ibuprofen Nausea And Vomiting  . Clonidine Derivatives Other (See Comments)    Unknown   . Statins   . Tramadol Nausea Only  . Latex Rash  . Levofloxacin Nausea Only  . Propoxyphene N-Acetaminophen Nausea Only    Physical Exam: BP 100/72   Pulse 68   Temp 97.9 F (36.6 C)   Resp 18   Ht 5\' 4"  (1.626 m)   Wt 127 lb 6.4 oz (57.8 kg)   SpO2 96%   BMI 21.87 kg/m    Physical Exam Vitals reviewed.  Constitutional:      Appearance: Normal appearance.  HENT:     Head: Normocephalic and atraumatic.     Ears:     Comments: Left TM not visualized due to copious cerumen.  Right TM with laceration noted.  No redness in the ear canal.    Nose:     Comments: Bilateral nares edematous and pale with clear nasal drainage noted.  Pharynx slightly erythematous with no exudate.  Eyes normal. Eyes:     Conjunctiva/sclera: Conjunctivae normal.  Cardiovascular:     Rate and Rhythm: Normal rate and regular rhythm.     Heart sounds: Normal heart sounds. No murmur heard.   Pulmonary:     Effort: Pulmonary effort is normal.  Breath sounds: Normal breath sounds.     Comments: Lungs clear to auscultation Musculoskeletal:        General: Normal range of motion.     Cervical back: Normal range of motion and neck supple.  Skin:    General: Skin is warm and dry.  Neurological:     Mental Status: She is alert and oriented to person, place, and time.  Psychiatric:        Mood and Affect: Mood normal.        Behavior: Behavior normal.        Thought Content: Thought content normal.        Judgment: Judgment normal.     Diagnostics: FVC 2.30, FEV1 1.64.  Predicted FVC 3.21, predicted FEV1 2.46.  Spirometry indicates mild restriction.  Postbronchodilator FVC 2.30, FEV1 1.63.  Postbronchodilator  spirometry indicates mild restriction with no bronchodilator response.  This is consistent with previous spirometry readings.  Assessment and Plan: 1. Severe persistent asthma without complication   2. Asthma-COPD overlap syndrome ([place])   3. Non-seasonal allergic rhinitis due to fungal spores   4. Compliance poor   5. Tobacco abuse   6. Hymenoptera allergy     Meds ordered this encounter  Medications  . montelukast (SINGULAIR) 10 MG tablet    Sig: Take 1 tablet (10 mg total) by mouth at bedtime.    Dispense:  90 tablet    Refill:  3    Requesting 1 year supply  . EPINEPHrine 0.3 mg/0.3 mL IJ SOAJ injection    Sig: INJECT AS DIRECTED FOR SEVERE ALLERGIC REACTION    Dispense:  2 each    Refill:  1    Please keep appt for additional refills  . mometasone-formoterol (DULERA) 200-5 MCG/ACT AERO    Sig: Inhale 2 puffs into the lungs 2 (two) times daily.    Dispense:  1 each    Refill:  5  . levalbuterol (XOPENEX HFA) 45 MCG/ACT inhaler    Sig: Inhale 2 puffs into the lungs as directed.    Dispense:  15 g    Refill:  2  . cetirizine (ZYRTEC) 10 MG tablet    Sig: Take 1 tablet (10 mg total) by mouth daily.    Dispense:  31 tablet    Refill:  5    Patient Instructions  Asthma Restart montelukast 10 mg once a day to prevent cough or wheeze Restart Dulera 200-2 puffs twice a day with a spacer to prevent cough or wheeze Continue Xopenex 2 puffs once every 4-6 hours as needed for cough or wheeze Continue Fasenra injections once a month and have access to an epinephrine autoinjector set  Allergic rhinitis Continue allergen avoidance measures directed toward mold, cat, and dog as listed below Continue allergen immunotherapy once a week and have access to an epinephrine autoinjector soft Continue cetirizine 10 mg once a day as needed for runny nose Begin Flonase 2 sprays in each nostril once a day as needed for a stuffy nose Consider saline nasal rinses as needed for nasal symptoms.  Use this before any medicated nasal sprays for best result  Reflux Continue dietary and lifestyle modifications as listed below Continue Dexilant 60 mg once a day to control reflux  Stinging insect allergy Continue to avoid stinging insects. In case of an allergic reaction, take Benadryl 50 mg every 4 hours, and if life-threatening symptoms occur, inject with EpiPen 0.3 mg.  Tobacco Continue to cut down here daily cigarette smoking and try  to quit smoking cigarettes Written information provided from up-to-date  Call the clinic if this treatment plan is not working well for you  Follow up in 2 months or sooner if needed.   Return in about 2 months (around 12/31/2020), or if symptoms worsen or fail to improve.    Thank you for the opportunity to care for this patient.  Please do not hesitate to contact me with questions.  Gareth Morgan, FNP [facility]

## 2020-11-01 NOTE — Patient Instructions (Addendum)
Asthma Restart montelukast 10 mg once a day to prevent cough or wheeze Restart Dulera 200-2 puffs twice a day with a spacer to prevent cough or wheeze Continue Xopenex 2 puffs once every 4-6 hours as needed for cough or wheeze Continue Fasenra injections once a month and have access to an epinephrine autoinjector set  Allergic rhinitis Continue allergen avoidance measures directed toward mold, cat, and dog as listed below Continue allergen immunotherapy once a week and have access to an epinephrine autoinjector soft Continue cetirizine 10 mg once a day as needed for runny nose Begin Flonase 2 sprays in each nostril once a day as needed for a stuffy nose Consider saline nasal rinses as needed for nasal symptoms. Use this before any medicated nasal sprays for best result  Reflux Continue dietary and lifestyle modifications as listed below Continue Dexilant 60 mg once a day to control reflux  Stinging insect allergy Continue to avoid stinging insects. In case of an allergic reaction, take Benadryl 50 mg every 4 hours, and if life-threatening symptoms occur, inject with EpiPen 0.3 mg.  Tobacco Continue to cut down here daily cigarette smoking and try to quit smoking cigarettes Written information provided from up-to-date  Call the clinic if this treatment plan is not working well for you  Follow up in 2 months or sooner if needed.  Control of Mold Allergen Mold and fungi can grow on a variety of surfaces provided certain temperature and moisture conditions exist.  Outdoor molds grow on plants, decaying vegetation and soil.  The major outdoor mold, Alternaria and Cladosporium, are found in very high numbers during hot and dry conditions.  Generally, a late Summer - Fall peak is seen for common outdoor fungal spores.  Rain will temporarily lower outdoor mold spore count, but counts rise rapidly when the rainy period ends.  The most important indoor molds are Aspergillus and Penicillium.  Dark,  humid and poorly ventilated basements are ideal sites for mold growth.  The next most common sites of mold growth are the bathroom and the kitchen.  Outdoor [employer] 1. Use air conditioning and keep windows closed 2. Avoid exposure to decaying vegetation. 3. Avoid leaf raking. 4. Avoid grain handling. 5. Consider wearing a face mask if working in moldy areas.  Indoor Mold Control 1. Maintain humidity below 50%. 2. Clean washable surfaces with 5% bleach solution. 3. Remove sources e.g. Contaminated carpets.  Control of Dog or Cat Allergen Avoidance is the best way to manage a dog or cat allergy. If you have a dog or cat and are allergic to dog or cats, consider removing the dog or cat from the home. If you have a dog or cat but don't want to find it a new home, or if your family wants a pet even though someone in the household is allergic, here are some strategies that may help keep symptoms at bay:  6. Keep the pet out of your bedroom and restrict it to only a few rooms. Be advised that keeping the dog or cat in only one room will not limit the allergens to that room. 7. Don't pet, hug or kiss the dog or cat; if you do, wash your hands with soap and water. 8. High-efficiency particulate air (HEPA) cleaners run continuously in a bedroom or living room can reduce allergen levels over time. 9. Regular use of a high-efficiency vacuum cleaner or a central vacuum can reduce allergen levels. 10. Giving your dog or cat a bath at least  once a week can reduce airborne allergen.

## 2020-11-02 ENCOUNTER — Ambulatory Visit (INDEPENDENT_AMBULATORY_CARE_PROVIDER_SITE_OTHER): Payer: Medicare Other | Admitting: Family Medicine

## 2020-11-02 ENCOUNTER — Encounter: Payer: Self-pay | Admitting: Family Medicine

## 2020-11-02 ENCOUNTER — Other Ambulatory Visit: Payer: Self-pay

## 2020-11-02 VITALS — BP 100/72 | HR 68 | Temp 97.9°F | Resp 18 | Ht 64.0 in | Wt 127.4 lb

## 2020-11-02 DIAGNOSIS — Z91038 Other insect allergy status: Secondary | ICD-10-CM | POA: Diagnosis not present

## 2020-11-02 DIAGNOSIS — J4489 Other specified chronic obstructive pulmonary disease: Secondary | ICD-10-CM

## 2020-11-02 DIAGNOSIS — J3089 Other allergic rhinitis: Secondary | ICD-10-CM

## 2020-11-02 DIAGNOSIS — Z9119 Patient's noncompliance with other medical treatment and regimen: Secondary | ICD-10-CM | POA: Diagnosis not present

## 2020-11-02 DIAGNOSIS — J449 Chronic obstructive pulmonary disease, unspecified: Secondary | ICD-10-CM | POA: Diagnosis not present

## 2020-11-02 DIAGNOSIS — Z91199 Patient's noncompliance with other medical treatment and regimen due to unspecified reason: Secondary | ICD-10-CM

## 2020-11-02 DIAGNOSIS — J4550 Severe persistent asthma, uncomplicated: Secondary | ICD-10-CM | POA: Diagnosis not present

## 2020-11-02 DIAGNOSIS — Z720 Tobacco use: Secondary | ICD-10-CM | POA: Diagnosis not present

## 2020-11-02 DIAGNOSIS — J309 Allergic rhinitis, unspecified: Secondary | ICD-10-CM

## 2020-11-02 MED ORDER — LEVALBUTEROL TARTRATE 45 MCG/ACT IN AERO: 2.0000 | INHALATION_SPRAY | RESPIRATORY_TRACT | 2 refills | Status: DC

## 2020-11-02 MED ORDER — MONTELUKAST SODIUM 10 MG PO TABS: 10.0000 mg | ORAL_TABLET | Freq: Every day | ORAL | 3 refills | Status: DC

## 2020-11-02 MED ORDER — CETIRIZINE HCL 10 MG PO TABS: 10.0000 mg | ORAL_TABLET | Freq: Every day | ORAL | 5 refills | Status: DC

## 2020-11-02 MED ORDER — DULERA 200-5 MCG/ACT IN AERO: 2.0000 | INHALATION_SPRAY | Freq: Two times a day (BID) | RESPIRATORY_TRACT | 5 refills | Status: DC

## 2020-11-02 MED ORDER — EPINEPHRINE 0.3 MG/0.3ML IJ SOAJ: INTRAMUSCULAR | 1 refills | Status: DC

## 2020-11-07 ENCOUNTER — Ambulatory Visit (INDEPENDENT_AMBULATORY_CARE_PROVIDER_SITE_OTHER): Payer: Medicare Other | Admitting: *Deleted

## 2020-11-07 DIAGNOSIS — J309 Allergic rhinitis, unspecified: Secondary | ICD-10-CM

## 2020-11-08 ENCOUNTER — Telehealth: Payer: Self-pay

## 2020-11-08 NOTE — Telephone Encounter (Signed)
Prior Authorization for xopenex has been initiated in covermymeds

## 2020-11-13 ENCOUNTER — Ambulatory Visit (INDEPENDENT_AMBULATORY_CARE_PROVIDER_SITE_OTHER): Payer: Medicare Other

## 2020-11-13 DIAGNOSIS — J309 Allergic rhinitis, unspecified: Secondary | ICD-10-CM | POA: Diagnosis not present

## 2020-11-13 DIAGNOSIS — G43719 Chronic migraine without aura, intractable, without status migrainosus: Secondary | ICD-10-CM | POA: Diagnosis not present

## 2020-11-14 ENCOUNTER — Other Ambulatory Visit: Payer: Self-pay

## 2020-11-14 ENCOUNTER — Ambulatory Visit (INDEPENDENT_AMBULATORY_CARE_PROVIDER_SITE_OTHER): Payer: Medicare Other

## 2020-11-14 ENCOUNTER — Ambulatory Visit (INDEPENDENT_AMBULATORY_CARE_PROVIDER_SITE_OTHER): Payer: Medicare Other | Admitting: Internal Medicine

## 2020-11-14 ENCOUNTER — Encounter: Payer: Self-pay | Admitting: Internal Medicine

## 2020-11-14 VITALS — BP 124/82 | HR 66 | Ht 64.0 in | Wt 125.0 lb

## 2020-11-14 DIAGNOSIS — F339 Major depressive disorder, recurrent, unspecified: Secondary | ICD-10-CM

## 2020-11-14 DIAGNOSIS — Z0000 Encounter for general adult medical examination without abnormal findings: Secondary | ICD-10-CM

## 2020-11-14 DIAGNOSIS — R739 Hyperglycemia, unspecified: Secondary | ICD-10-CM

## 2020-11-14 DIAGNOSIS — Z0001 Encounter for general adult medical examination with abnormal findings: Secondary | ICD-10-CM

## 2020-11-14 DIAGNOSIS — R8290 Unspecified abnormal findings in urine: Secondary | ICD-10-CM | POA: Diagnosis not present

## 2020-11-14 DIAGNOSIS — E559 Vitamin D deficiency, unspecified: Secondary | ICD-10-CM | POA: Diagnosis not present

## 2020-11-14 DIAGNOSIS — R059 Cough, unspecified: Secondary | ICD-10-CM

## 2020-11-14 DIAGNOSIS — E538 Deficiency of other specified B group vitamins: Secondary | ICD-10-CM | POA: Diagnosis not present

## 2020-11-14 DIAGNOSIS — I700 Atherosclerosis of aorta: Secondary | ICD-10-CM | POA: Diagnosis not present

## 2020-11-14 DIAGNOSIS — E785 Hyperlipidemia, unspecified: Secondary | ICD-10-CM | POA: Diagnosis not present

## 2020-11-14 LAB — CBC WITH DIFFERENTIAL/PLATELET
Basophils Absolute: 0 10*3/uL (ref 0.0–0.1)
Basophils Relative: 0.1 % (ref 0.0–3.0)
Eosinophils Absolute: 0 10*3/uL (ref 0.0–0.7)
Eosinophils Relative: 0 % (ref 0.0–5.0)
HCT: 42.5 % (ref 36.0–46.0)
Hemoglobin: 15 g/dL (ref 12.0–15.0)
Lymphocytes Relative: 41.7 % (ref 12.0–46.0)
Lymphs Abs: 3.9 10*3/uL (ref 0.7–4.0)
MCHC: 35.3 g/dL (ref 30.0–36.0)
MCV: 96.1 fl (ref 78.0–100.0)
Monocytes Absolute: 0.7 10*3/uL (ref 0.1–1.0)
Monocytes Relative: 7.4 % (ref 3.0–12.0)
Neutro Abs: 4.8 10*3/uL (ref 1.4–7.7)
Neutrophils Relative %: 50.8 % (ref 43.0–77.0)
Platelets: 267 10*3/uL (ref 150.0–400.0)
RBC: 4.43 Mil/uL (ref 3.87–5.11)
RDW: 13.4 % (ref 11.5–15.5)
WBC: 9.4 10*3/uL (ref 4.0–10.5)

## 2020-11-14 LAB — LIPID PANEL
Cholesterol: 186 mg/dL (ref 0–200)
HDL: 47.8 mg/dL (ref >39.00)
NonHDL: 137.94
Total CHOL/HDL Ratio: 4
Triglycerides: 222 mg/dL — ABNORMAL HIGH (ref 0.0–149.0)
VLDL: 44.4 mg/dL — ABNORMAL HIGH (ref 0.0–40.0)

## 2020-11-14 LAB — URINALYSIS, ROUTINE W REFLEX MICROSCOPIC
Bilirubin Urine: NEGATIVE
Ketones, ur: NEGATIVE
Nitrite: NEGATIVE
Specific Gravity, Urine: 1.025 (ref 1.000–1.030)
Total Protein, Urine: NEGATIVE
Urine Glucose: NEGATIVE
Urobilinogen, UA: 0.2 (ref 0.0–1.0)
pH: 5.5 (ref 5.0–8.0)

## 2020-11-14 LAB — VITAMIN D 25 HYDROXY (VIT D DEFICIENCY, FRACTURES): VITD: 40.04 ng/mL (ref 30.00–100.00)

## 2020-11-14 LAB — HEPATIC FUNCTION PANEL
ALT: 12 U/L (ref 0–35)
AST: 16 U/L (ref 0–37)
Albumin: 4.7 g/dL (ref 3.5–5.2)
Alkaline Phosphatase: 84 U/L (ref 39–117)
Bilirubin, Direct: 0.1 mg/dL (ref 0.0–0.3)
Total Bilirubin: 0.6 mg/dL (ref 0.2–1.2)
Total Protein: 7.3 g/dL (ref 6.0–8.3)

## 2020-11-14 LAB — HEMOGLOBIN A1C: Hgb A1c MFr Bld: 5.2 % (ref 4.6–6.5)

## 2020-11-14 LAB — BASIC METABOLIC PANEL
BUN: 11 mg/dL (ref 6–23)
CO2: 26 mEq/L (ref 19–32)
Calcium: 9.5 mg/dL (ref 8.4–10.5)
Chloride: 103 mEq/L (ref 96–112)
Creatinine, Ser: 0.99 mg/dL (ref 0.40–1.20)
GFR: 60.36 mL/min (ref >60.00)
Glucose, Bld: 76 mg/dL (ref 70–99)
Potassium: 3.7 mEq/L (ref 3.5–5.1)
Sodium: 136 mEq/L (ref 135–145)

## 2020-11-14 LAB — TSH: TSH: 1.58 u[IU]/mL (ref 0.35–4.50)

## 2020-11-14 LAB — LDL CHOLESTEROL, DIRECT: Direct LDL: 112 mg/dL

## 2020-11-14 LAB — VITAMIN B12: Vitamin B-12: 220 pg/mL (ref 211–911)

## 2020-11-14 NOTE — Patient Instructions (Addendum)
Please continue all other medications as before  Please have the pharmacy call with any other refills you may need.  Please continue your efforts at being more active, low cholesterol diet, and weight control.  You are otherwise up to date with prevention measures today.  Please keep your appointments with your specialists as you may have planned  Please go to the XRAY Department in the first floor for the x-ray testing  Please go to the LAB at the blood drawing area for the tests to be done  You will be contacted by phone if any changes need to be made immediately.  Otherwise, you will receive a letter about your results with an explanation, but please check with MyChart first.  Please remember to sign up for MyChart if you have not done so, as this will be important to you in the future with finding out test results, communicating by private email, and scheduling acute appointments online when needed.  Please make an Appointment to return in 6 months, or sooner if needed

## 2020-11-14 NOTE — Progress Notes (Signed)
Patient ID: Lori [name], female   DOB: 1956/03/15, 65 y.o.   MRN: [id number]         Chief Complaint:: wellness exam and Follow-up genital herpes tx, anxiety, urine odor, persistent cough, and depression       HPI:  Lori [name] is a 65 y.o. female here for wellness exam; declines flu shot, and plans to call on her own for mammogram, o/w up to date with preventive referrals, and imunizations                        Also asks for restart med tx for herpes suppressive tx as has had several outbreaks recently, none currently.  Has had mild worsening depressive symptoms, but no suicidal ideation, or panic; has ongoing anxiety, declines further tx for now.  Has 3 days worsening urinary odor and mild urgency, but Denies urinary symptoms such as dysuria, frequency, flank pain, hematuria or n/v, fever, chills.  Also has persistent non prod cough worsening in the last 2 mo without fever and  Pt denies wt loss, night sweats, loss of appetite, or other constitutional symptoms  Denies worsening allergy post nasal gtt or wheezing and Pt denies chest pain, increased sob or doe, orthopnea, PND, increased LE swelling, palpitations, dizziness or syncope.   Wt Readings from Last 3 Encounters:  11/14/20 125 lb (56.7 kg)  11/02/20 127 lb 6.4 oz (57.8 kg)  08/15/20 130 lb (59 kg)   BP Readings from Last 3 Encounters:  11/14/20 124/82  11/02/20 100/72  08/15/20 110/70   Immunization History  Administered Date(s) Administered  . Influenza,inj,Quad PF,6+ Mos 12/03/2016, 05/18/2017, 05/20/2018, 05/25/2019  . Influenza,inj,Quad PF,6-35 Mos 05/18/2019  . Influenza-Unspecified 05/18/2017, 07/17/2018  . PFIZER(Purple Top)SARS-COV-2 Vaccination 10/18/2019, 11/08/2019, 06/19/2020  . Pneumococcal Conjugate-13 12/31/2016  . Pneumococcal Polysaccharide-23 01/17/2014, 12/31/2017  . Tdap 09/09/2013, 05/10/2016   There are no preventive care reminders to display for this patient.    Past Medical History:   Diagnosis Date  . Acute MI ([place])    x3 - by report only. She has had 3 negative Myoview stress test with no evidence of prior infarct.  . Allergic rhinitis   . Allergy   . Anemia   . Ankle fracture 05/2016  . Anxiety   . Arthritis   . Asthma   . Barrett esophagus 2007  . Chest pain 05-02-2009   echo  EF 55%  . COPD (chronic obstructive pulmonary disease) with chronic bronchitis ([facility])    & Emphysema  . Depression   . Duodenitis without mention of hemorrhage 2007  . Esophageal reflux 2007  . Esophageal stricture   . Full dentures   . H/O hiatal hernia   . Hiatal hernia 3419,[id number]  . Hyperlipidemia   . Multiple fractures    from falls, fx rt. elbow, fx left wrist, bilateral ankles  . Pneumonia 10/2016  . Restrictive lung disease 06/30/2017  . Ulcer    Past Surgical History:  Procedure Laterality Date  . ABDOMINAL HYSTERECTOMY     BSO  . APPENDECTOMY    . CESAREAN SECTION     x 2  . CHOLECYSTECTOMY    . CHONDROPLASTY Left 01/06/2015   Procedure: CHONDROPLASTY;  Surgeon: Marybelle Killings, MD;  Location: [facility];  Service: Orthopedics;  Laterality: Left;  . COLONOSCOPY    . ELBOW FRACTURE SURGERY     right  . KNEE ARTHROSCOPY WITH EXCISION PLICA Left 2/40/9735   Procedure: KNEE  ARTHROSCOPY WITH EXCISION PLICA;  Surgeon: Marybelle Killings, MD;  Location: [facility];  Service: Orthopedics;  Laterality: Left;  . Lower Extremity Arterial Dopplers  07/06/2013   RABI 1.0, LABI 1.1.; No evidence of significant vascular atherogenic plaque  . NECK SURGERY    . NM MYOVIEW LTD  09/2014   LOW RISK. Normal EF of 65% with no regional wall motion abnormalities. No ischemia or infarction.  . TONSILLECTOMY    . WRIST FRACTURE SURGERY     left, has plate    reports that she has been smoking cigarettes. She started smoking about 43 years ago. She has a 19.50 pack-year smoking history. She has never used smokeless tobacco. She reports that she does not drink alcohol  and does not use drugs. family history includes Dementia in her mother; Diabetes in her maternal aunt, maternal grandmother, and another family member; Emphysema in her father; Heart disease in her brother. Allergies  Allergen Reactions  . Bee Venom Swelling  . Codeine Nausea Only and Other (See Comments)    CAUSES ULCERS  . Ibuprofen Nausea And Vomiting  . Clonidine Derivatives Other (See Comments)    Unknown   . Statins   . Tramadol Nausea Only  . Latex Rash  . Levofloxacin Nausea Only  . Propoxyphene N-Acetaminophen Nausea Only   Current Outpatient Medications on File Prior to Visit  Medication Sig Dispense Refill  . ALPRAZolam (XANAX) 1 MG tablet TAKE 1 TABLET BY MOUTH AT BEDTIME AS NEEDED FOR ANXIETY 90 tablet 1  . cetirizine (ZYRTEC) 10 MG tablet Take 1 tablet (10 mg total) by mouth daily. 31 tablet 5  . cyclobenzaprine (FLEXERIL) 5 MG tablet Take 1 tablet by mouth three times daily as needed for muscle spasm 60 tablet 2  . DEXILANT 60 MG capsule Take 1 capsule by mouth once daily 90 capsule 3  . diphenoxylate-atropine (LOMOTIL) 2.5-0.025 MG tablet Take 1 tablet by mouth 4 (four) times daily as needed for diarrhea or loose stools. 30 tablet 0  . EPINEPHrine 0.3 mg/0.3 mL IJ SOAJ injection INJECT AS DIRECTED FOR SEVERE ALLERGIC REACTION 2 each 1  . gabapentin (NEURONTIN) 300 MG capsule Take by mouth.    Marland Kitchen guaiFENesin-dextromethorphan (ROBITUSSIN DM) 100-10 MG/5ML syrup Take 5 mLs by mouth every 4 (four) hours as needed for cough. 118 mL 1  . levalbuterol (XOPENEX HFA) 45 MCG/ACT inhaler Inhale 2 puffs into the lungs as directed. 15 g 2  . meclizine (ANTIVERT) 12.5 MG tablet Take 1 tablet (12.5 mg total) by mouth 3 (three) times daily as needed for dizziness. 270 tablet 1  . mometasone-formoterol (DULERA) 200-5 MCG/ACT AERO Inhale 2 puffs into the lungs 2 (two) times daily. 1 each 5  . montelukast (SINGULAIR) 10 MG tablet Take 1 tablet (10 mg total) by mouth at bedtime. 90 tablet 3   . NARCAN 4 MG/0.1ML LIQD nasal spray kit     . nitroGLYCERIN (NITROSTAT) 0.4 MG SL tablet DISSOLVE ONE TABLET UNDER THE TONGUE EVERY 5 MINUTES AS NEEDED FOR CHEST PAIN.  DO NOT EXCEED A TOTAL OF 3 DOSES IN 15 MINUTES (Patient taking differently: DISSOLVE ONE TABLET UNDER THE TONGUE EVERY 5 MINUTES AS NEEDED FOR CHEST PAIN.  DO NOT EXCEED A TOTAL OF 3 DOSES IN 15 MINUTES) 25 tablet 0  . pneumococcal 23 valent vaccine (PNEUMOVAX 23) 25 MCG/0.5ML injection Pneumovax-23 25 mcg/0.5 mL injection syringe  ADM 0.5ML IM UTD    . valACYclovir (VALTREX) 1000 MG tablet TAKE 1 TABLET  BY MOUTH once daily 270 tablet 3   Current Facility-Administered Medications on File Prior to Visit  Medication Dose Route Frequency Provider Last Rate Last Admin  . Benralizumab SOSY 30 mg  30 mg Subcutaneous Q8 weeks Valentina Shaggy, MD   30 mg at 10/16/20 0924        ROS:  All others reviewed and negative.  Objective        PE:  BP 124/82   Pulse 66   Ht '5\' 4"'  (1.626 m)   Wt 125 lb (56.7 kg)   SpO2 99%   BMI 21.46 kg/m                 Constitutional: Pt appears in NAD               HENT: Head: NCAT.                Right Ear: External ear normal.                 Left Ear: External ear normal.                Eyes: . Pupils are equal, round, and reactive to light. Conjunctivae and EOM are normal               Nose: without d/c or deformity               Neck: Neck supple. Gross normal ROM               Cardiovascular: Normal rate and regular rhythm.                 Pulmonary/Chest: Effort normal and breath sounds without rales or wheezing.                Abd:  Soft, NT, ND, + BS, no organomegaly               Neurological: Pt is alert. At baseline orientation, motor grossly intact               Skin: Skin is warm. No rashes, no other new lesions, LE edema - none               Psychiatric: Pt behavior is normal without agitation , depressed affect  Micro: none  Cardiac tracings I have personally  interpreted today:  none  Pertinent Radiological findings (summarize): none   Lab Results  Component Value Date   WBC 9.4 11/14/2020   HGB 15.0 11/14/2020   HCT 42.5 11/14/2020   PLT 267.0 11/14/2020   GLUCOSE 76 11/14/2020   CHOL 186 11/14/2020   TRIG 222.0 (H) 11/14/2020   HDL 47.80 11/14/2020   LDLDIRECT 112.0 11/14/2020   LDLCALC Comment 03/12/2017   ALT 12 11/14/2020   AST 16 11/14/2020   NA 136 11/14/2020   K 3.7 11/14/2020   CL 103 11/14/2020   CREATININE 0.99 11/14/2020   BUN 11 11/14/2020   CO2 26 11/14/2020   TSH 1.58 11/14/2020   INR 0.9 08/31/2008   HGBA1C 5.2 11/14/2020   Assessment/Plan:  MONIFA BLANCHETTE is a 65 y.o. White or Caucasian [1] female with  has a past medical history of Acute MI ([place]), Allergic rhinitis, Allergy, Anemia, Ankle fracture (05/2016), Anxiety, Arthritis, Asthma, Barrett esophagus (2007), Chest pain (05-02-2009), COPD (chronic obstructive pulmonary disease) with chronic bronchitis ([place]), Depression, Duodenitis without mention of hemorrhage (2007), Esophageal reflux (2007), Esophageal stricture, Full dentures, H/O hiatal  hernia, Hiatal hernia ([phone]), Hyperlipidemia, Multiple fractures, Pneumonia (10/2016), Restrictive lung disease (06/30/2017), and Ulcer.  Encounter for well adult exam with abnormal findings Age and sex appropriate education and counseling updated with regular exercise and diet Referrals for preventative services - none needed, pt to call for mammogram Immunizations addressed - none needed - decliens flu shot Smoking counseling  - none needed Evidence for depression or other mood disorder - none significant Most recent labs reviewed. I have personally reviewed and have noted: 1) the patient's medical and social history 2) The patient's current medications and supplements 3) The patient's height, weight, and BMI have been recorded in the chart   Abnormal urine odor Exam benign, with some urgency, for urine culture,   to f/u any worsening symptoms or concerns  Aortic atherosclerosis ([place]) To continue crestor 20 and low chol diet  B12 deficiency Lab Results  Component Value Date   VITAMINB12 220 11/14/2020   Stable, cont oral replacement - b12 1000 mcg qd   Hyperglycemia Lab Results  Component Value Date   HGBA1C 5.2 11/14/2020   Stable, pt to continue current medical treatment  - diet and wt control   Hyperlipidemia LDL goal <100 Lab Results  Component Value Date   LDLCALC Comment 03/12/2017   Stable, pt to continue current statin crestor   Vitamin D deficiency Last vitamin D Lab Results  Component Value Date   VD25OH 40.04 11/14/2020   Stable, cont oral replacement   Cough Exam benign, ok for cxr  Depression Chronic mild persistent, declines further tx for now or referral for counseling  Followup: Return in about 6 months (around 05/17/2021).  Cathlean Cower, MD 11/23/2020 9:30 AM [facility]

## 2020-11-15 ENCOUNTER — Encounter: Payer: Self-pay | Admitting: Internal Medicine

## 2020-11-15 ENCOUNTER — Other Ambulatory Visit: Payer: Self-pay | Admitting: Internal Medicine

## 2020-11-15 LAB — URINE CULTURE

## 2020-11-15 MED ORDER — ROSUVASTATIN CALCIUM 40 MG PO TABS: 40.0000 mg | ORAL_TABLET | Freq: Every day | ORAL | 3 refills | Status: DC

## 2020-11-17 NOTE — Telephone Encounter (Signed)
Please Advise

## 2020-11-17 NOTE — Telephone Encounter (Signed)
PA has been approved and faxed to pharmacy. Approval letter placed in bulk scanning.

## 2020-11-20 ENCOUNTER — Ambulatory Visit (INDEPENDENT_AMBULATORY_CARE_PROVIDER_SITE_OTHER): Payer: Medicare Other | Admitting: *Deleted

## 2020-11-20 DIAGNOSIS — J309 Allergic rhinitis, unspecified: Secondary | ICD-10-CM

## 2020-11-23 ENCOUNTER — Encounter: Payer: Self-pay | Admitting: Internal Medicine

## 2020-11-23 NOTE — Assessment & Plan Note (Signed)
Exam benign, ok for cxr

## 2020-11-23 NOTE — Assessment & Plan Note (Signed)
Lab Results  Component Value Date   HGBA1C 5.2 11/14/2020   Stable, pt to continue current medical treatment  - diet and wt control

## 2020-11-23 NOTE — Assessment & Plan Note (Signed)
Exam benign, with some urgency, for urine culture,  to f/u any worsening symptoms or concerns

## 2020-11-23 NOTE — Assessment & Plan Note (Signed)
To continue crestor 20 and low chol diet

## 2020-11-23 NOTE — Assessment & Plan Note (Signed)
Lab Results  Component Value Date   VITAMINB12 220 11/14/2020   Stable, cont oral replacement - b12 1000 mcg qd

## 2020-11-23 NOTE — Assessment & Plan Note (Signed)
Chronic mild persistent, declines further tx for now or referral for counseling

## 2020-11-23 NOTE — Assessment & Plan Note (Signed)
Age and sex appropriate education and counseling updated with regular exercise and diet Referrals for preventative services - none needed, pt to call for mammogram Immunizations addressed - none needed - decliens flu shot Smoking counseling  - none needed Evidence for depression or other mood disorder - none significant Most recent labs reviewed. I have personally reviewed and have noted: 1) the patient's medical and social history 2) The patient's current medications and supplements 3) The patient's height, weight, and BMI have been recorded in the chart

## 2020-11-23 NOTE — Assessment & Plan Note (Signed)
Last vitamin D Lab Results  Component Value Date   VD25OH 40.04 11/14/2020   Stable, cont oral replacement

## 2020-11-23 NOTE — Assessment & Plan Note (Signed)
Lab Results  Component Value Date   LDLCALC Comment 03/12/2017   Stable, pt to continue current statin crestor

## 2020-11-30 ENCOUNTER — Ambulatory Visit (INDEPENDENT_AMBULATORY_CARE_PROVIDER_SITE_OTHER): Payer: Medicare Other | Admitting: *Deleted

## 2020-11-30 DIAGNOSIS — J309 Allergic rhinitis, unspecified: Secondary | ICD-10-CM | POA: Diagnosis not present

## 2020-12-11 ENCOUNTER — Ambulatory Visit (INDEPENDENT_AMBULATORY_CARE_PROVIDER_SITE_OTHER): Payer: Medicare Other

## 2020-12-11 ENCOUNTER — Other Ambulatory Visit: Payer: Self-pay

## 2020-12-11 DIAGNOSIS — J4550 Severe persistent asthma, uncomplicated: Secondary | ICD-10-CM

## 2020-12-12 ENCOUNTER — Other Ambulatory Visit: Payer: Self-pay | Admitting: Internal Medicine

## 2020-12-12 DIAGNOSIS — G43719 Chronic migraine without aura, intractable, without status migrainosus: Secondary | ICD-10-CM | POA: Diagnosis not present

## 2020-12-13 IMAGING — DX DG CHEST 2V
2 series · 2 of 2 positions shown · non-contrast
Comparison: 12/29/2017 and prior exams

CLINICAL DATA: Cough and congestion for 2 weeks.

EXAM:
CHEST - 2 VIEW

[chest pa]
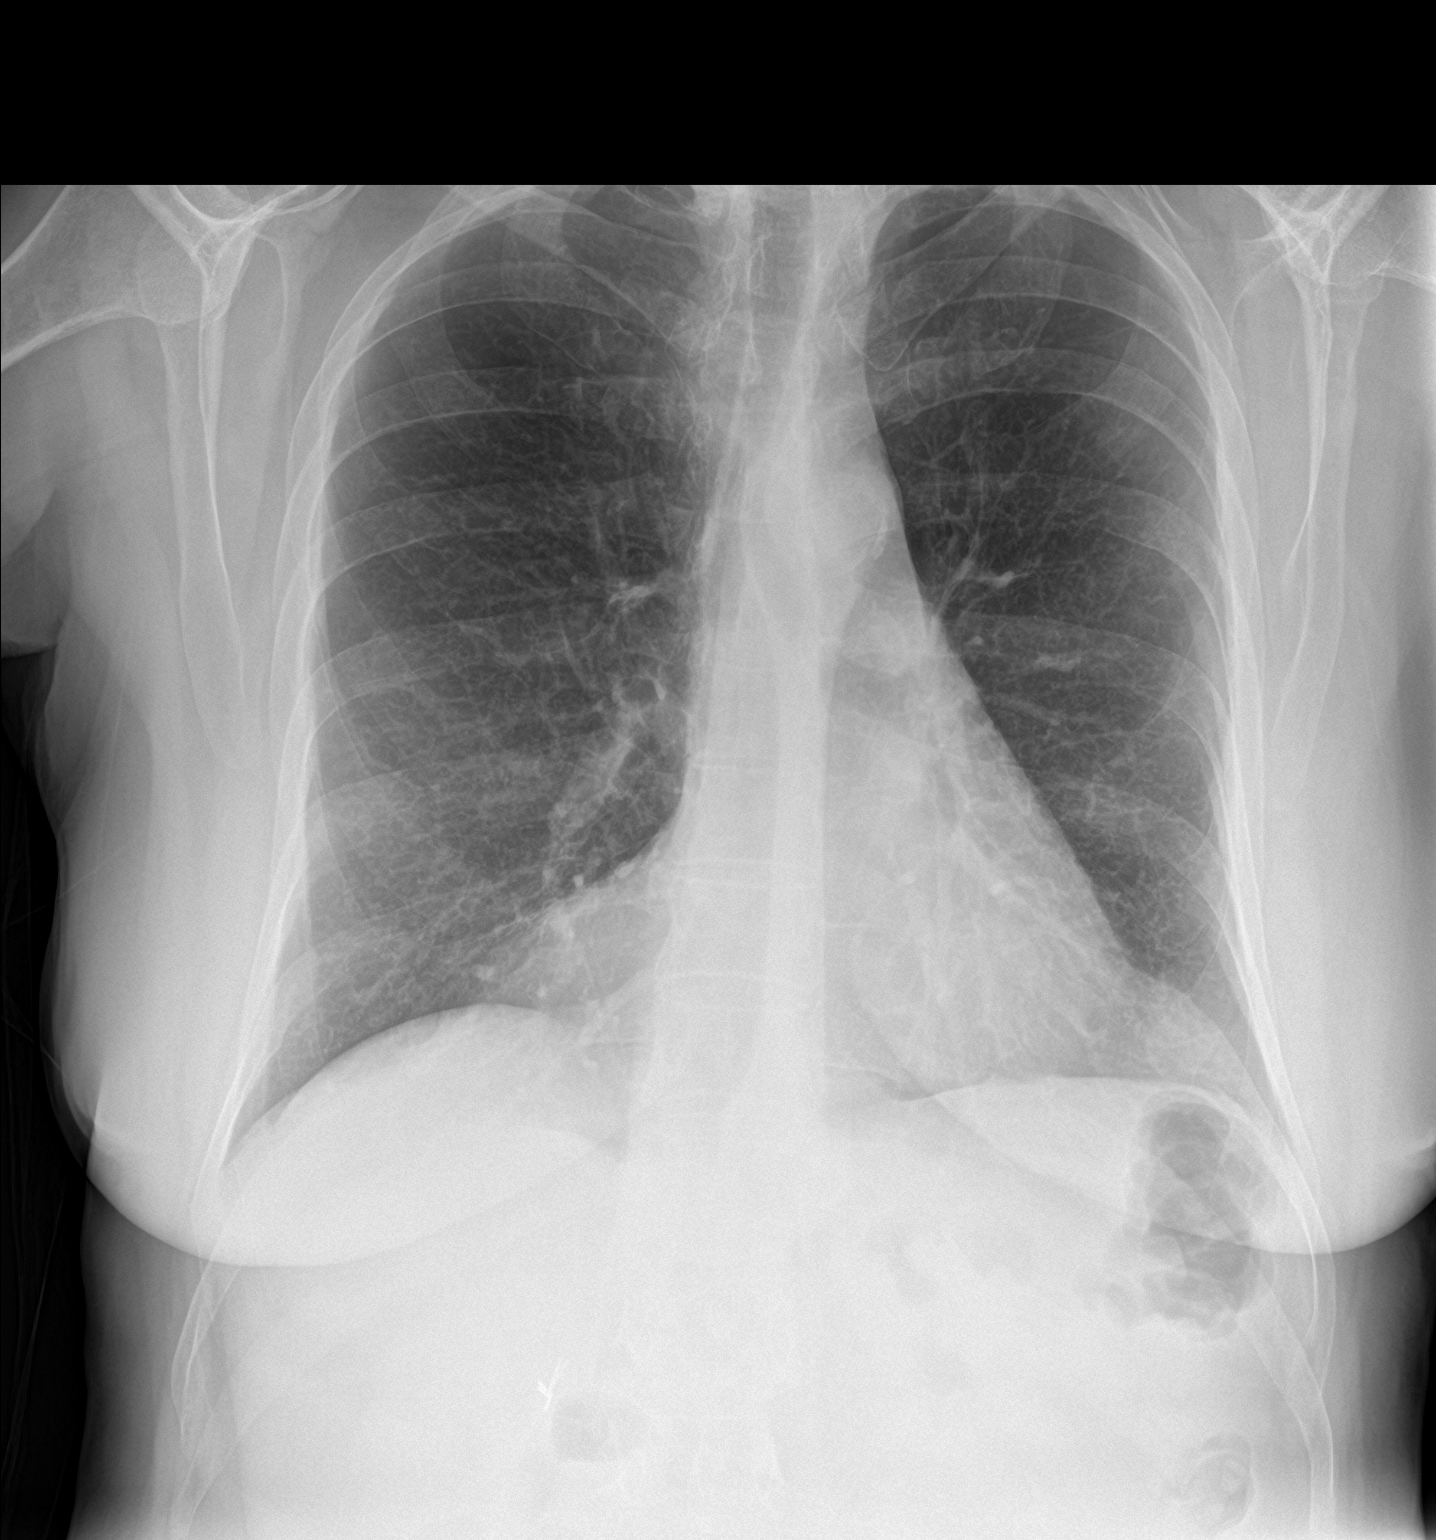

[chest lat]
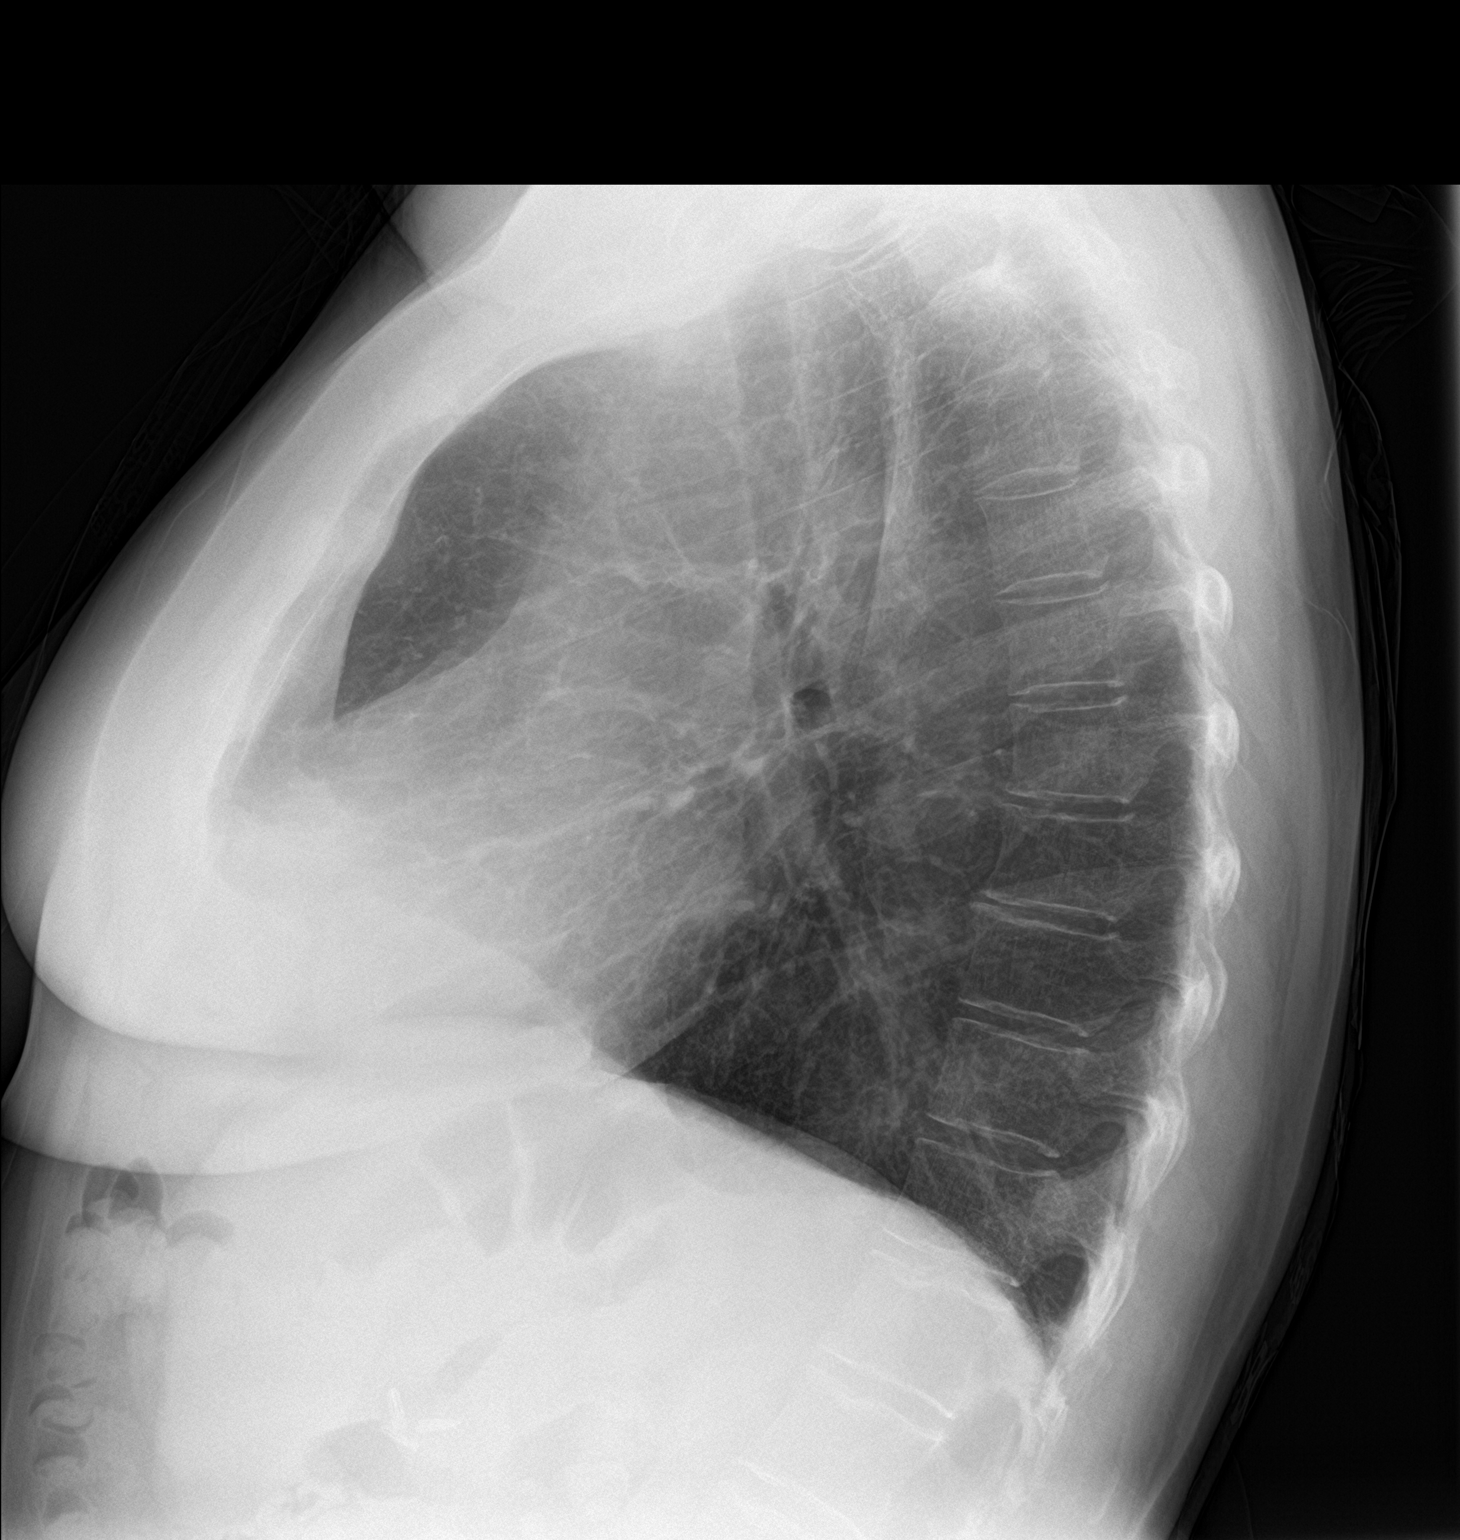

[2 of 2 positions shown; findings below may reference images not displayed]

FINDINGS: Cardiomediastinal silhouette is unchanged with RIGHT pericardial fat
pad again noted.

Probable emphysema/COPD changes again noted.

There is no evidence of focal airspace disease, pulmonary edema,
suspicious pulmonary nodule/mass, pleural effusion, or pneumothorax.

No acute bony abnormalities are identified.
IMPRESSION: Probable emphysema/COPD changes. No evidence of acute
cardiopulmonary disease.
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
## 2020-12-15 ENCOUNTER — Telehealth: Payer: Self-pay

## 2020-12-15 ENCOUNTER — Ambulatory Visit (INDEPENDENT_AMBULATORY_CARE_PROVIDER_SITE_OTHER): Payer: Medicare Other

## 2020-12-15 DIAGNOSIS — J309 Allergic rhinitis, unspecified: Secondary | ICD-10-CM | POA: Diagnosis not present

## 2020-12-15 NOTE — Telephone Encounter (Signed)
Every four weeks is fine with me. I believe she wanted more frequent injections at some point.   Salvatore Marvel, MD [facility]

## 2020-12-15 NOTE — Telephone Encounter (Signed)
Patient came in for her allergy injections today. She received 0.5 of red vial. She is on schedule B and coming in weekly. Patient would like to know when can she go back to every 4 weeks since she is at 0.5 now. Please advice. Thank you

## 2020-12-18 DIAGNOSIS — J3089 Other allergic rhinitis: Secondary | ICD-10-CM | POA: Diagnosis not present

## 2020-12-18 NOTE — Telephone Encounter (Signed)
Flowsheet has been updated to reflect this change and patient has been made aware.

## 2020-12-18 NOTE — Progress Notes (Signed)
VIALS EXP 12-18-21

## 2020-12-19 DIAGNOSIS — J3081 Allergic rhinitis due to animal (cat) (dog) hair and dander: Secondary | ICD-10-CM | POA: Diagnosis not present

## 2020-12-25 ENCOUNTER — Telehealth: Payer: Self-pay

## 2020-12-25 NOTE — Telephone Encounter (Signed)
Can you please see what is covered under her insurance plan? Thank you

## 2020-12-26 NOTE — Telephone Encounter (Signed)
error 

## 2020-12-28 ENCOUNTER — Other Ambulatory Visit: Payer: Self-pay | Admitting: *Deleted

## 2020-12-28 NOTE — Telephone Encounter (Signed)
Any duel inhaler like symbicort, advair, wixela, airduo. Thank you

## 2020-12-28 NOTE — Telephone Encounter (Signed)
Evern Core, can you please give me a list of some different inhalers you have in mind, that way I can check on the coverage for them. Thank You.

## 2021-01-01 ENCOUNTER — Other Ambulatory Visit: Payer: Self-pay | Admitting: *Deleted

## 2021-01-01 ENCOUNTER — Telehealth: Payer: Self-pay | Admitting: Internal Medicine

## 2021-01-01 MED ORDER — FLUTICASONE-SALMETEROL 230-21 MCG/ACT IN AERO: 2.0000 | INHALATION_SPRAY | Freq: Two times a day (BID) | RESPIRATORY_TRACT | 5 refills | Status: DC

## 2021-01-01 NOTE — Telephone Encounter (Signed)
New prescription has been sent in. Called patient and advised to let us know if it is too expensive. Patient verbalized understanding.

## 2021-01-01 NOTE — Telephone Encounter (Signed)
[facility] Call/Report : Caller states she has Vertigo. Dizziness  Advised go to ED now. Patient refused.

## 2021-01-01 NOTE — Telephone Encounter (Signed)
All of these inhalers are preferred by her insurance, they just have quantity limits so when sending in medication we just have to be careful not to prescribe over the preferred limit. It doesn't necessarily say how much it will be for her. But we can always send one in and recommend her to call the pharmacy first before picking up the medication.

## 2021-01-01 NOTE — Telephone Encounter (Signed)
Lets try Advair 230-2 puffs twice a day with a spacer to prevent cough or wheeze. This will replace Dulera. Thank you. Please have her call if this is expensive. Thank you

## 2021-01-02 ENCOUNTER — Ambulatory Visit (INDEPENDENT_AMBULATORY_CARE_PROVIDER_SITE_OTHER): Payer: Medicare Other | Admitting: Internal Medicine

## 2021-01-02 ENCOUNTER — Encounter: Payer: Self-pay | Admitting: Internal Medicine

## 2021-01-02 ENCOUNTER — Other Ambulatory Visit: Payer: Self-pay

## 2021-01-02 VITALS — BP 124/78 | HR 77 | Ht 64.0 in | Wt 130.4 lb

## 2021-01-02 DIAGNOSIS — E785 Hyperlipidemia, unspecified: Secondary | ICD-10-CM | POA: Diagnosis not present

## 2021-01-02 DIAGNOSIS — J309 Allergic rhinitis, unspecified: Secondary | ICD-10-CM | POA: Diagnosis not present

## 2021-01-02 DIAGNOSIS — R739 Hyperglycemia, unspecified: Secondary | ICD-10-CM

## 2021-01-02 DIAGNOSIS — R42 Dizziness and giddiness: Secondary | ICD-10-CM | POA: Diagnosis not present

## 2021-01-02 LAB — GLUCOSE, POCT (MANUAL RESULT ENTRY): POC Glucose: 107 mg/dl — AB (ref 70–99)

## 2021-01-02 MED ORDER — TRIAMCINOLONE ACETONIDE 55 MCG/ACT NA AERO: 2.0000 | INHALATION_SPRAY | Freq: Every day | NASAL | 12 refills | Status: DC

## 2021-01-02 MED ORDER — PREDNISONE 10 MG PO TABS: ORAL_TABLET | ORAL | 0 refills | Status: DC

## 2021-01-02 MED ORDER — MECLIZINE HCL 25 MG PO TABS: 25.0000 mg | ORAL_TABLET | Freq: Three times a day (TID) | ORAL | 2 refills | Status: DC | PRN

## 2021-01-02 MED ORDER — METHYLPREDNISOLONE ACETATE 80 MG/ML IJ SUSP
80.0000 mg | Freq: Once | INTRAMUSCULAR | Status: AC
  Administered 2021-01-02: 80 mg via INTRAMUSCULAR

## 2021-01-02 NOTE — Patient Instructions (Addendum)
Your blood sugar was ok today  You had the steroid shot today  Please take all new medication as prescribed - prednisone for the allergies, and start nasacort nasal for allergies as well  Ok to increase the meclizine to 25 mg up to three times per day for the vertigo  Please continue all other medications as before, and refills have been done if requested.  Please have the pharmacy call with any other refills you may need.  Please continue your efforts at being more active, low cholesterol diet, and weight control, and continue the crestor at the 40 mg  Please keep your appointments with your specialists as you may have planned

## 2021-01-02 NOTE — Progress Notes (Signed)
Patient ID: Lori [name], female   DOB: 23-Nov-1955, 65 y.o.   MRN: [id number]        Chief Complaint: follow up HLD and hyperglycemia, also with allergies and vertigo       HPI:  Lori [name] is a 65 y.o. female here c/o several wks ongoing nasal allergy symptoms with clearish congestion, itch and sneezing, without fever, pain, ST, cough, swelling or wheezing, but has left ear fullness and recurring brief bouts of mild positional vertigo with nausea.  Pt denies chest pain, increased sob or doe, wheezing, orthopnea, PND, increased LE swelling, palpitations, dizziness or syncope.   Pt denies polydipsia, polyuria,  Denies new focal neuro s/s except for above.  Tolerating new crestor 40 mg but does not want lab test today  Wt Readings from Last 3 Encounters:  01/02/21 130 lb 6.4 oz (59.1 kg)  11/14/20 125 lb (56.7 kg)  11/02/20 127 lb 6.4 oz (57.8 kg)   BP Readings from Last 3 Encounters:  01/02/21 124/78  11/14/20 124/82  11/02/20 100/72         Past Medical History:  Diagnosis Date  . Acute MI ([place])    x3 - by report only. She has had 3 negative Myoview stress test with no evidence of prior infarct.  . Allergic rhinitis   . Allergy   . Anemia   . Ankle fracture 05/2016  . Anxiety   . Arthritis   . Asthma   . Barrett esophagus 2007  . Chest pain 05-02-2009   echo  EF 55%  . COPD (chronic obstructive pulmonary disease) with chronic bronchitis ([facility])    & Emphysema  . Depression   . Duodenitis without mention of hemorrhage 2007  . Esophageal reflux 2007  . Esophageal stricture   . Full dentures   . H/O hiatal hernia   . Hiatal hernia [id number]  . Hyperlipidemia   . Multiple fractures    from falls, fx rt. elbow, fx left wrist, bilateral ankles  . Pneumonia 10/2016  . Restrictive lung disease 06/30/2017  . Ulcer    Past Surgical History:  Procedure Laterality Date  . ABDOMINAL HYSTERECTOMY     BSO  . APPENDECTOMY    . CESAREAN SECTION     x 2  . CHOLECYSTECTOMY     . CHONDROPLASTY Left 01/06/2015   Procedure: CHONDROPLASTY;  Surgeon: Marybelle Killings, MD;  Location: [place];  Service: Orthopedics;  Laterality: Left;  . COLONOSCOPY    . ELBOW FRACTURE SURGERY     right  . KNEE ARTHROSCOPY WITH EXCISION PLICA Left 7/85/8850   Procedure: KNEE ARTHROSCOPY WITH EXCISION PLICA;  Surgeon: Marybelle Killings, MD;  Location: [place];  Service: Orthopedics;  Laterality: Left;  . Lower Extremity Arterial Dopplers  07/06/2013   RABI 1.0, LABI 1.1.; No evidence of significant vascular atherogenic plaque  . NECK SURGERY    . NM MYOVIEW LTD  09/2014   LOW RISK. Normal EF of 65% with no regional wall motion abnormalities. No ischemia or infarction.  . TONSILLECTOMY    . WRIST FRACTURE SURGERY     left, has plate    reports that she has been smoking cigarettes. She started smoking about 43 years ago. She has a 19.50 pack-year smoking history. She has never used smokeless tobacco. She reports that she does not drink alcohol and does not use drugs. family history includes Dementia in her mother; Diabetes in her maternal aunt, maternal grandmother, and  another family member; Emphysema in her father; Heart disease in her brother. Allergies  Allergen Reactions  . Bee Venom Swelling  . Codeine Nausea Only and Other (See Comments)    CAUSES ULCERS  . Ibuprofen Nausea And Vomiting  . Clonidine Derivatives Other (See Comments)    Unknown   . Statins   . Tramadol Nausea Only  . Latex Rash  . Levofloxacin Nausea Only  . Propoxyphene N-Acetaminophen Nausea Only   Current Outpatient Medications on File Prior to Visit  Medication Sig Dispense Refill  . ALPRAZolam (XANAX) 1 MG tablet TAKE 1 TABLET BY MOUTH AT BEDTIME AS NEEDED FOR ANXIETY 90 tablet 1  . cetirizine (ZYRTEC) 10 MG tablet Take 1 tablet (10 mg total) by mouth daily. 31 tablet 5  . cyclobenzaprine (FLEXERIL) 5 MG tablet Take 1 tablet by mouth three times daily as needed for muscle  spasm 60 tablet 2  . DEXILANT 60 MG capsule Take 1 capsule by mouth once daily 90 capsule 3  . diphenoxylate-atropine (LOMOTIL) 2.5-0.025 MG tablet Take 1 tablet by mouth 4 (four) times daily as needed for diarrhea or loose stools. 30 tablet 0  . EPINEPHrine 0.3 mg/0.3 mL IJ SOAJ injection INJECT AS DIRECTED FOR SEVERE ALLERGIC REACTION 2 each 1  . fluticasone-salmeterol (ADVAIR HFA) 230-21 MCG/ACT inhaler Inhale 2 puffs into the lungs 2 (two) times daily. 12 g 5  . gabapentin (NEURONTIN) 300 MG capsule Take by mouth.    Marland Kitchen guaiFENesin-dextromethorphan (ROBITUSSIN DM) 100-10 MG/5ML syrup Take 5 mLs by mouth every 4 (four) hours as needed for cough. 118 mL 1  . levalbuterol (XOPENEX HFA) 45 MCG/ACT inhaler Inhale 2 puffs into the lungs as directed. 15 g 2  . mometasone-formoterol (DULERA) 200-5 MCG/ACT AERO Inhale 2 puffs into the lungs 2 (two) times daily. 1 each 5  . montelukast (SINGULAIR) 10 MG tablet Take 1 tablet (10 mg total) by mouth at bedtime. 90 tablet 3  . NARCAN 4 MG/0.1ML LIQD nasal spray kit     . nitroGLYCERIN (NITROSTAT) 0.4 MG SL tablet DISSOLVE ONE TABLET UNDER THE TONGUE EVERY 5 MINUTES AS NEEDED FOR CHEST PAIN.  DO NOT EXCEED A TOTAL OF 3 DOSES IN 15 MINUTES (Patient taking differently: DISSOLVE ONE TABLET UNDER THE TONGUE EVERY 5 MINUTES AS NEEDED FOR CHEST PAIN.  DO NOT EXCEED A TOTAL OF 3 DOSES IN 15 MINUTES) 25 tablet 0  . pneumococcal 23 valent vaccine (PNEUMOVAX 23) 25 MCG/0.5ML injection Pneumovax-23 25 mcg/0.5 mL injection syringe  ADM 0.5ML IM UTD    . rosuvastatin (CRESTOR) 40 MG tablet Take 1 tablet (40 mg total) by mouth daily. 90 tablet 3  . valACYclovir (VALTREX) 1000 MG tablet Take 1 tablet by mouth twice daily 180 tablet 0  . ondansetron (ZOFRAN) 4 MG tablet ondansetron HCl 4 mg tablet     Current Facility-Administered Medications on File Prior to Visit  Medication Dose Route Frequency Provider Last Rate Last Admin  . Benralizumab SOSY 30 mg  30 mg Subcutaneous  Q8 weeks Valentina Shaggy, MD   30 mg at 12/11/20 0902        ROS:  All others reviewed and negative.  Objective        PE:  BP 124/78 (BP Location: Left Arm, Patient Position: Sitting, Cuff Size: Large)   Pulse 77   Ht 5' 4" (1.626 m)   Wt 130 lb 6.4 oz (59.1 kg)   SpO2 98%   BMI 22.38 kg/m  Constitutional: Pt appears in NAD               HENT: Head: NCAT.                Right Ear: External ear normal.                 Left Ear: External ear normal. Left TM mild erythema               Eyes: . Pupils are equal, round, and reactive to light. Conjunctivae and EOM are normal; Bilat tm's with mild erythema.  Max sinus areas mild tender.  Pharynx with mild erythema, no exudate               Nose: without d/c or deformity               Neck: Neck supple. Gross normal ROM               Cardiovascular: Normal rate and regular rhythm.                 Pulmonary/Chest: Effort normal and breath sounds without rales or wheezing.                Abd:  Soft, NT, ND, + BS, no organomegaly               Neurological: Pt is alert. At baseline orientation, motor grossly intact               Skin: Skin is warm. No rashes, no other new lesions, LE edema - none               Psychiatric: Pt behavior is normal without agitation   Micro: none  Cardiac tracings I have personally interpreted today:  none  Pertinent Radiological findings (summarize): none   Lab Results  Component Value Date   WBC 9.4 11/14/2020   HGB 15.0 11/14/2020   HCT 42.5 11/14/2020   PLT 267.0 11/14/2020   GLUCOSE 76 11/14/2020   CHOL 186 11/14/2020   TRIG 222.0 (H) 11/14/2020   HDL 47.80 11/14/2020   LDLDIRECT 112.0 11/14/2020   LDLCALC Comment 03/12/2017   ALT 12 11/14/2020   AST 16 11/14/2020   NA 136 11/14/2020   K 3.7 11/14/2020   CL 103 11/14/2020   CREATININE 0.99 11/14/2020   BUN 11 11/14/2020   CO2 26 11/14/2020   TSH 1.58 11/14/2020   INR 0.9 08/31/2008   HGBA1C 5.2 11/14/2020    Contains abnormal dataPOCT Glucose (CBG) Order: [id number]  Status: Final result   Visible to patient: No (scheduled for 01/02/2021 12:04 PM)   Dx: Hyperglycemia   0 Result Notes   Ref Range & Units 11:04  POC Glucose 70 - 99 mg/dl 107Abnormal     Assessment/Plan:  SAHIRAH RUDELL is a 65 y.o. White or Caucasian [1] female with  has a past medical history of Acute MI ([place]), Allergic rhinitis, Allergy, Anemia, Ankle fracture (05/2016), Anxiety, Arthritis, Asthma, Barrett esophagus (2007), Chest pain (05-02-2009), COPD (chronic obstructive pulmonary disease) with chronic bronchitis ([place]), Depression, Duodenitis without mention of hemorrhage (2007), Esophageal reflux (2007), Esophageal stricture, Full dentures, H/O hiatal hernia, Hiatal hernia ([id number]), Hyperlipidemia, Multiple fractures, Pneumonia (10/2016), Restrictive lung disease (06/30/2017), and Ulcer.  Hyperlipidemia LDL goal <100 Tolerating crestor well, for low chol diet, and f/u lab next visit  Hyperglycemia Lab Results  Component Value Date   HGBA1C 5.2 11/14/2020   Stable, pt to continue  current medical treatment  - diet   Vertigo Ok for meclizine prn,  to f/u any worsening symptoms or concerns  Allergic rhinitis Mild to mod, for depomedrol 80 IM, and nasacort asd,  to f/u any worsening symptoms or concerns  Followup: Return if symptoms worsen or fail to improve.  Cathlean Cower, MD 01/08/2021 8:29 PM [facility]

## 2021-01-08 ENCOUNTER — Encounter: Payer: Self-pay | Admitting: Internal Medicine

## 2021-01-08 DIAGNOSIS — J309 Allergic rhinitis, unspecified: Secondary | ICD-10-CM | POA: Insufficient documentation

## 2021-01-08 NOTE — Assessment & Plan Note (Signed)
Mild to mod, for depomedrol 80 IM, and nasacort asd,  to f/u any worsening symptoms or concerns

## 2021-01-08 NOTE — Assessment & Plan Note (Signed)
Lab Results  Component Value Date   HGBA1C 5.2 11/14/2020   Stable, pt to continue current medical treatment  - diet

## 2021-01-08 NOTE — Assessment & Plan Note (Signed)
Tolerating crestor well, for low chol diet, and f/u lab next visit

## 2021-01-08 NOTE — Assessment & Plan Note (Signed)
Ok for meclizine prn,  to f/u any worsening symptoms or concerns 

## 2021-01-09 DIAGNOSIS — G43719 Chronic migraine without aura, intractable, without status migrainosus: Secondary | ICD-10-CM | POA: Diagnosis not present

## 2021-01-10 ENCOUNTER — Telehealth: Payer: Self-pay | Admitting: Internal Medicine

## 2021-01-10 NOTE — Telephone Encounter (Signed)
Error

## 2021-01-12 ENCOUNTER — Ambulatory Visit (INDEPENDENT_AMBULATORY_CARE_PROVIDER_SITE_OTHER): Payer: Medicare Other

## 2021-01-12 ENCOUNTER — Ambulatory Visit (INDEPENDENT_AMBULATORY_CARE_PROVIDER_SITE_OTHER): Payer: Medicare Other | Admitting: Internal Medicine

## 2021-01-12 ENCOUNTER — Encounter: Payer: Self-pay | Admitting: Internal Medicine

## 2021-01-12 ENCOUNTER — Other Ambulatory Visit: Payer: Self-pay

## 2021-01-12 VITALS — BP 120/78 | HR 84 | Temp 97.9°F | Ht 64.0 in | Wt 130.0 lb

## 2021-01-12 DIAGNOSIS — R0600 Dyspnea, unspecified: Secondary | ICD-10-CM | POA: Diagnosis not present

## 2021-01-12 DIAGNOSIS — R739 Hyperglycemia, unspecified: Secondary | ICD-10-CM | POA: Diagnosis not present

## 2021-01-12 DIAGNOSIS — J309 Allergic rhinitis, unspecified: Secondary | ICD-10-CM

## 2021-01-12 DIAGNOSIS — F17210 Nicotine dependence, cigarettes, uncomplicated: Secondary | ICD-10-CM

## 2021-01-12 DIAGNOSIS — R030 Elevated blood-pressure reading, without diagnosis of hypertension: Secondary | ICD-10-CM

## 2021-01-12 DIAGNOSIS — J441 Chronic obstructive pulmonary disease with (acute) exacerbation: Secondary | ICD-10-CM

## 2021-01-12 DIAGNOSIS — J449 Chronic obstructive pulmonary disease, unspecified: Secondary | ICD-10-CM | POA: Diagnosis not present

## 2021-01-12 DIAGNOSIS — J439 Emphysema, unspecified: Secondary | ICD-10-CM | POA: Diagnosis not present

## 2021-01-12 DIAGNOSIS — R059 Cough, unspecified: Secondary | ICD-10-CM | POA: Diagnosis not present

## 2021-01-12 MED ORDER — BENZONATATE 200 MG PO CAPS: 200.0000 mg | ORAL_CAPSULE | Freq: Three times a day (TID) | ORAL | 1 refills | Status: DC | PRN

## 2021-01-12 MED ORDER — METHYLPREDNISOLONE ACETATE 80 MG/ML IJ SUSP
80.0000 mg | Freq: Once | INTRAMUSCULAR | Status: AC
  Administered 2021-01-12: 80 mg via INTRAMUSCULAR

## 2021-01-12 MED ORDER — AMOXICILLIN-POT CLAVULANATE 875-125 MG PO TABS: 1.0000 | ORAL_TABLET | Freq: Two times a day (BID) | ORAL | 0 refills | Status: DC

## 2021-01-12 MED ORDER — PREDNISONE 10 MG PO TABS: ORAL_TABLET | ORAL | 0 refills | Status: DC

## 2021-01-12 NOTE — Patient Instructions (Signed)
You had the steroid shot today  Please take all new medication as prescribed - the antibiotic, prednisone, and cough pills  Please continue all other medications as before, and refills have been done if requested.  Please have the pharmacy call with any other refills you may need.  Please continue your efforts at being more active, low cholesterol diet, and weight control.  You are otherwise up to date with prevention measures today.  Please keep your appointments with your specialists as you may have planned  Please go to the XRAY Department in the first floor for the x-ray testing  You will be contacted by phone if any changes need to be made immediately.  Otherwise, you will receive a letter about your results with an explanation, but please check with MyChart first.  Please remember to sign up for MyChart if you have not done so, as this will be important to you in the future with finding out test results, communicating by private email, and scheduling acute appointments online when needed.

## 2021-01-12 NOTE — Progress Notes (Signed)
Patient ID: Lori [name], female   DOB: [birth year], 65 y.o.   MRN: [id number]        Chief Complaint: follow up HTN, vertigo, cough and wheezing       HPI:  Lori [name] is a 65 y.o. female Here with acute onset mild to mod 2-3 days ST, HA, general weakness and malaise, with prod cough greenish sputum, with worsening wheezing, sob and doe again, but Pt denies chest pain, orthopnea, PND, increased LE swelling, palpitations, or syncope.  Was doing better with recent prednisone, then worse again.  Has ongoing mild vertigo but not worsening.  No falls.    Pt denies polydipsia, polyuria, or new focal neuro s/s.   Pt denies recent wt loss, night sweats, loss of appetite, or other constitutional symptoms      Wt Readings from Last 3 Encounters:  01/12/21 130 lb (59 kg)  01/02/21 130 lb 6.4 oz (59.1 kg)  11/14/20 125 lb (56.7 kg)   BP Readings from Last 3 Encounters:  01/12/21 120/78  01/02/21 124/78  11/14/20 124/82         Past Medical History:  Diagnosis Date  . Acute MI ([place])    x3 - by report only. She has had 3 negative Myoview stress test with no evidence of prior infarct.  . Allergic rhinitis   . Allergy   . Anemia   . Ankle fracture 05/2016  . Anxiety   . Arthritis   . Asthma   . Barrett esophagus 2007  . Chest pain 05-02-2009   echo  EF 55%  . COPD (chronic obstructive pulmonary disease) with chronic bronchitis (HCC)    & Emphysema  . Depression   . Duodenitis without mention of hemorrhage 2007  . Esophageal reflux 2007  . Esophageal stricture   . Full dentures   . H/O hiatal hernia   . Hiatal hernia 5188,4166  . Hyperlipidemia   . Multiple fractures    from falls, fx rt. elbow, fx left wrist, bilateral ankles  . Pneumonia 10/2016  . Restrictive lung disease 06/30/2017  . Ulcer    Past Surgical History:  Procedure Laterality Date  . ABDOMINAL HYSTERECTOMY     BSO  . APPENDECTOMY    . CESAREAN SECTION     x 2  . CHOLECYSTECTOMY    . CHONDROPLASTY Left  01/06/2015   Procedure: CHONDROPLASTY;  Surgeon: Marybelle Killings, MD;  Location: [facility];  Service: Orthopedics;  Laterality: Left;  . COLONOSCOPY    . ELBOW FRACTURE SURGERY     right  . KNEE ARTHROSCOPY WITH EXCISION PLICA Left 0/63/0160   Procedure: KNEE ARTHROSCOPY WITH EXCISION PLICA;  Surgeon: Marybelle Killings, MD;  Location: [facility];  Service: Orthopedics;  Laterality: Left;  . Lower Extremity Arterial Dopplers  07/06/2013   RABI 1.0, LABI 1.1.; No evidence of significant vascular atherogenic plaque  . NECK SURGERY    . NM MYOVIEW LTD  09/2014   LOW RISK. Normal EF of 65% with no regional wall motion abnormalities. No ischemia or infarction.  . TONSILLECTOMY    . WRIST FRACTURE SURGERY     left, has plate    reports that she has been smoking cigarettes. She started smoking about 43 years ago. She has a 19.50 pack-year smoking history. She has never used smokeless tobacco. She reports that she does not drink alcohol and does not use drugs. family history includes Dementia in her mother; Diabetes in her maternal  aunt, maternal grandmother, and another family member; Emphysema in her father; Heart disease in her brother. Allergies  Allergen Reactions  . Bee Venom Swelling  . Codeine Nausea Only and Other (See Comments)    CAUSES ULCERS  . Ibuprofen Nausea And Vomiting  . Clonidine Derivatives Other (See Comments)    Unknown   . Statins   . Tramadol Nausea Only  . Latex Rash  . Levofloxacin Nausea Only  . Propoxyphene N-Acetaminophen Nausea Only   Current Outpatient Medications on File Prior to Visit  Medication Sig Dispense Refill  . AIMOVIG 140 MG/ML SOAJ SMARTSIG:140 Milligram(s) SUB-Q Once a Month    . ALPRAZolam (XANAX) 1 MG tablet TAKE 1 TABLET BY MOUTH AT BEDTIME AS NEEDED FOR ANXIETY 90 tablet 1  . cetirizine (ZYRTEC) 10 MG tablet Take 1 tablet (10 mg total) by mouth daily. 31 tablet 5  . cyclobenzaprine (FLEXERIL) 5 MG tablet Take 1 tablet  by mouth three times daily as needed for muscle spasm 60 tablet 2  . DEXILANT 60 MG capsule Take 1 capsule by mouth once daily 90 capsule 3  . diphenoxylate-atropine (LOMOTIL) 2.5-0.025 MG tablet Take 1 tablet by mouth 4 (four) times daily as needed for diarrhea or loose stools. 30 tablet 0  . EPINEPHrine 0.3 mg/0.3 mL IJ SOAJ injection INJECT AS DIRECTED FOR SEVERE ALLERGIC REACTION 2 each 1  . fluticasone-salmeterol (ADVAIR HFA) 230-21 MCG/ACT inhaler Inhale 2 puffs into the lungs 2 (two) times daily. 12 g 5  . gabapentin (NEURONTIN) 300 MG capsule Take by mouth.    Marland Kitchen guaiFENesin-dextromethorphan (ROBITUSSIN DM) 100-10 MG/5ML syrup Take 5 mLs by mouth every 4 (four) hours as needed for cough. 118 mL 1  . levalbuterol (XOPENEX HFA) 45 MCG/ACT inhaler Inhale 2 puffs into the lungs as directed. 15 g 2  . meclizine (ANTIVERT) 25 MG tablet Take 1 tablet (25 mg total) by mouth 3 (three) times daily as needed for dizziness. 40 tablet 2  . mometasone-formoterol (DULERA) 200-5 MCG/ACT AERO Inhale 2 puffs into the lungs 2 (two) times daily. 1 each 5  . montelukast (SINGULAIR) 10 MG tablet Take 1 tablet (10 mg total) by mouth at bedtime. 90 tablet 3  . NARCAN 4 MG/0.1ML LIQD nasal spray kit     . nitroGLYCERIN (NITROSTAT) 0.4 MG SL tablet DISSOLVE ONE TABLET UNDER THE TONGUE EVERY 5 MINUTES AS NEEDED FOR CHEST PAIN.  DO NOT EXCEED A TOTAL OF 3 DOSES IN 15 MINUTES (Patient taking differently: DISSOLVE ONE TABLET UNDER THE TONGUE EVERY 5 MINUTES AS NEEDED FOR CHEST PAIN.  DO NOT EXCEED A TOTAL OF 3 DOSES IN 15 MINUTES) 25 tablet 0  . ondansetron (ZOFRAN) 4 MG tablet ondansetron HCl 4 mg tablet    . pneumococcal 23 valent vaccine (PNEUMOVAX 23) 25 MCG/0.5ML injection Pneumovax-23 25 mcg/0.5 mL injection syringe  ADM 0.5ML IM UTD    . rosuvastatin (CRESTOR) 40 MG tablet Take 1 tablet (40 mg total) by mouth daily. 90 tablet 3  . triamcinolone (NASACORT) 55 MCG/ACT AERO nasal inhaler Place 2 sprays into the nose  daily. 1 each 12  . valACYclovir (VALTREX) 1000 MG tablet Take 1 tablet by mouth twice daily 180 tablet 0   Current Facility-Administered Medications on File Prior to Visit  Medication Dose Route Frequency Provider Last Rate Last Admin  . Benralizumab SOSY 30 mg  30 mg Subcutaneous Q8 weeks Valentina Shaggy, MD   30 mg at 12/11/20 0902        ROS:  All others reviewed and negative.  Objective        PE:  BP 120/78 (BP Location: Left Arm, Patient Position: Sitting, Cuff Size: Normal)   Pulse 84   Temp 97.9 F (36.6 C) (Oral)   Ht '5\' 4"'  (1.626 m)   Wt 130 lb (59 kg)   SpO2 98%   BMI 22.31 kg/m                 Constitutional: Pt appears in NAD but mild to mod ill, fatigued, some dizzy to stand               HENT: Head: NCAT.                Right Ear: External ear normal.                 Left Ear: External ear normal.                Eyes: . Pupils are equal, round, and reactive to light. Conjunctivae and EOM are normal               Nose: without d/c or deformity               Neck: Neck supple. Gross normal ROM               Cardiovascular: Normal rate and regular rhythm.                 Pulmonary/Chest: Effort normal and breath sounds decreased without rales but with bilat mild wheezing.                Abd:  Soft, NT, ND, + BS, no organomegaly               Neurological: Pt is alert. At baseline orientation, motor grossly intact, motor 5/5               Skin: Skin is warm. No rashes, no other new lesions, LE edema - none               Psychiatric: Pt behavior is normal without agitation   Micro: none  Cardiac tracings I have personally interpreted today:  none  Pertinent Radiological findings (summarize): none   Lab Results  Component Value Date   WBC 9.4 11/14/2020   HGB 15.0 11/14/2020   HCT 42.5 11/14/2020   PLT 267.0 11/14/2020   GLUCOSE 76 11/14/2020   CHOL 186 11/14/2020   TRIG 222.0 (H) 11/14/2020   HDL 47.80 11/14/2020   LDLDIRECT 112.0 11/14/2020    LDLCALC Comment 03/12/2017   ALT 12 11/14/2020   AST 16 11/14/2020   NA 136 11/14/2020   K 3.7 11/14/2020   CL 103 11/14/2020   CREATININE 0.99 11/14/2020   BUN 11 11/14/2020   CO2 26 11/14/2020   TSH 1.58 11/14/2020   INR 0.9 08/31/2008   HGBA1C 5.2 11/14/2020   Assessment/Plan:  YOLANDRA HABIG is a 65 y.o. White or Caucasian [1] female with  has a past medical history of Acute MI ([place]), Allergic rhinitis, Allergy, Anemia, Ankle fracture (05/2016), Anxiety, Arthritis, Asthma, Barrett esophagus (2007), Chest pain (05-02-2009), COPD (chronic obstructive pulmonary disease) with chronic bronchitis ([place]), Depression, Duodenitis without mention of hemorrhage (2007), Esophageal reflux (2007), Esophageal stricture, Full dentures, H/O hiatal hernia, Hiatal hernia (1950,9326), Hyperlipidemia, Multiple fractures, Pneumonia (10/2016), Restrictive lung disease (06/30/2017), and Ulcer.  COPD exacerbation (HCC) Mild to mod, for cxr, also depomedrol IM  80, predpac asd, augmentin course, tessalon perle and  to f/u any worsening symptoms or concerns  Cigarette smoker Counseled to quit  Borderline hypertension Overall mild reactive, will hold on further med increase at this time,  to f/u any worsening symptoms or concerns   Hyperglycemia Lab Results  Component Value Date   HGBA1C 5.2 11/14/2020   Stable, pt to continue current medical treatment -  diet   Followup: Return if symptoms worsen or fail to improve.  Cathlean Cower, MD 01/13/2021 3:01 AM [facility]

## 2021-01-13 ENCOUNTER — Encounter: Payer: Self-pay | Admitting: Internal Medicine

## 2021-01-13 NOTE — Assessment & Plan Note (Signed)
Counseled to quit 

## 2021-01-13 NOTE — Assessment & Plan Note (Signed)
Mild to mod, for cxr, also depomedrol IM 80, predpac asd, augmentin course, tessalon perle and  to f/u any worsening symptoms or concerns

## 2021-01-13 NOTE — Assessment & Plan Note (Signed)
Lab Results  Component Value Date   HGBA1C 5.2 11/14/2020   Stable, pt to continue current medical treatment  - diet  

## 2021-01-13 NOTE — Assessment & Plan Note (Signed)
Overall mild reactive, will hold on further med increase at this time,  to f/u any worsening symptoms or concerns

## 2021-01-15 ENCOUNTER — Ambulatory Visit (INDEPENDENT_AMBULATORY_CARE_PROVIDER_SITE_OTHER): Payer: Medicare Other

## 2021-01-15 ENCOUNTER — Telehealth: Payer: Self-pay | Admitting: Internal Medicine

## 2021-01-15 DIAGNOSIS — J309 Allergic rhinitis, unspecified: Secondary | ICD-10-CM | POA: Diagnosis not present

## 2021-01-15 NOTE — Telephone Encounter (Signed)
Plesae to let pt know  Cxr - all good, no acute abnormality

## 2021-01-15 NOTE — Telephone Encounter (Signed)
Patient notified

## 2021-01-15 NOTE — Telephone Encounter (Signed)
Patient called in regards to recent x ray results. She can be reached at [phone]. Please advise

## 2021-01-16 ENCOUNTER — Ambulatory Visit: Payer: Medicare Other | Admitting: Allergy & Immunology

## 2021-01-22 ENCOUNTER — Ambulatory Visit (INDEPENDENT_AMBULATORY_CARE_PROVIDER_SITE_OTHER): Payer: Medicare Other | Admitting: *Deleted

## 2021-01-22 DIAGNOSIS — J309 Allergic rhinitis, unspecified: Secondary | ICD-10-CM

## 2021-01-30 ENCOUNTER — Ambulatory Visit (INDEPENDENT_AMBULATORY_CARE_PROVIDER_SITE_OTHER): Payer: Medicare Other | Admitting: *Deleted

## 2021-01-30 DIAGNOSIS — J309 Allergic rhinitis, unspecified: Secondary | ICD-10-CM

## 2021-02-05 ENCOUNTER — Ambulatory Visit: Payer: Self-pay

## 2021-02-05 ENCOUNTER — Ambulatory Visit (INDEPENDENT_AMBULATORY_CARE_PROVIDER_SITE_OTHER): Payer: Medicare Other

## 2021-02-05 DIAGNOSIS — J309 Allergic rhinitis, unspecified: Secondary | ICD-10-CM

## 2021-02-06 ENCOUNTER — Other Ambulatory Visit: Payer: Self-pay | Admitting: Internal Medicine

## 2021-02-07 ENCOUNTER — Telehealth: Payer: Self-pay | Admitting: *Deleted

## 2021-02-07 ENCOUNTER — Ambulatory Visit (INDEPENDENT_AMBULATORY_CARE_PROVIDER_SITE_OTHER): Payer: Medicare Other

## 2021-02-07 ENCOUNTER — Other Ambulatory Visit: Payer: Self-pay

## 2021-02-07 DIAGNOSIS — J4550 Severe persistent asthma, uncomplicated: Secondary | ICD-10-CM

## 2021-02-07 NOTE — Telephone Encounter (Signed)
Thank you. She should also let her PCP know that she's having some vertigo issues. Keep them in the loop. Thank you

## 2021-02-07 NOTE — Telephone Encounter (Signed)
Patient came in and received her allergy injection and stated that she had some vertigo this morning and a sharp pain on her right parietal lobe. She wanted me to check her blood pressure for her because she said last time this happened her diastolic reading was higher. I checked her blood pressure and in her left arm and it was 120/88. She stated that she was no longer having pain in her lobe. She had not taking medication yet for her vertigo and was feeling swimmy headed. She was driven to the office today. I advised that when she gets home to call her headache doctor so that they are aware of what happened this morning and to let them know what her blood pressure was in office. Patient verbalized understanding.

## 2021-02-09 NOTE — Telephone Encounter (Signed)
Called and left a voicemail asking for the patient to return call to discuss how she is doing and for her to follow up with her PCP.

## 2021-02-13 ENCOUNTER — Ambulatory Visit (INDEPENDENT_AMBULATORY_CARE_PROVIDER_SITE_OTHER): Payer: Medicare Other | Admitting: *Deleted

## 2021-02-13 DIAGNOSIS — G43719 Chronic migraine without aura, intractable, without status migrainosus: Secondary | ICD-10-CM | POA: Diagnosis not present

## 2021-02-13 DIAGNOSIS — J309 Allergic rhinitis, unspecified: Secondary | ICD-10-CM

## 2021-02-20 IMAGING — CR LEFT FOOT - COMPLETE 3+ VIEW
3 series · 3 of 3 positions shown · non-contrast
Comparison: None.

CLINICAL DATA: Post fall, now with pain involving the mid dorsal
aspect of the foot.

EXAM:
LEFT FOOT - COMPLETE 3+ VIEW

[x foot ap left]
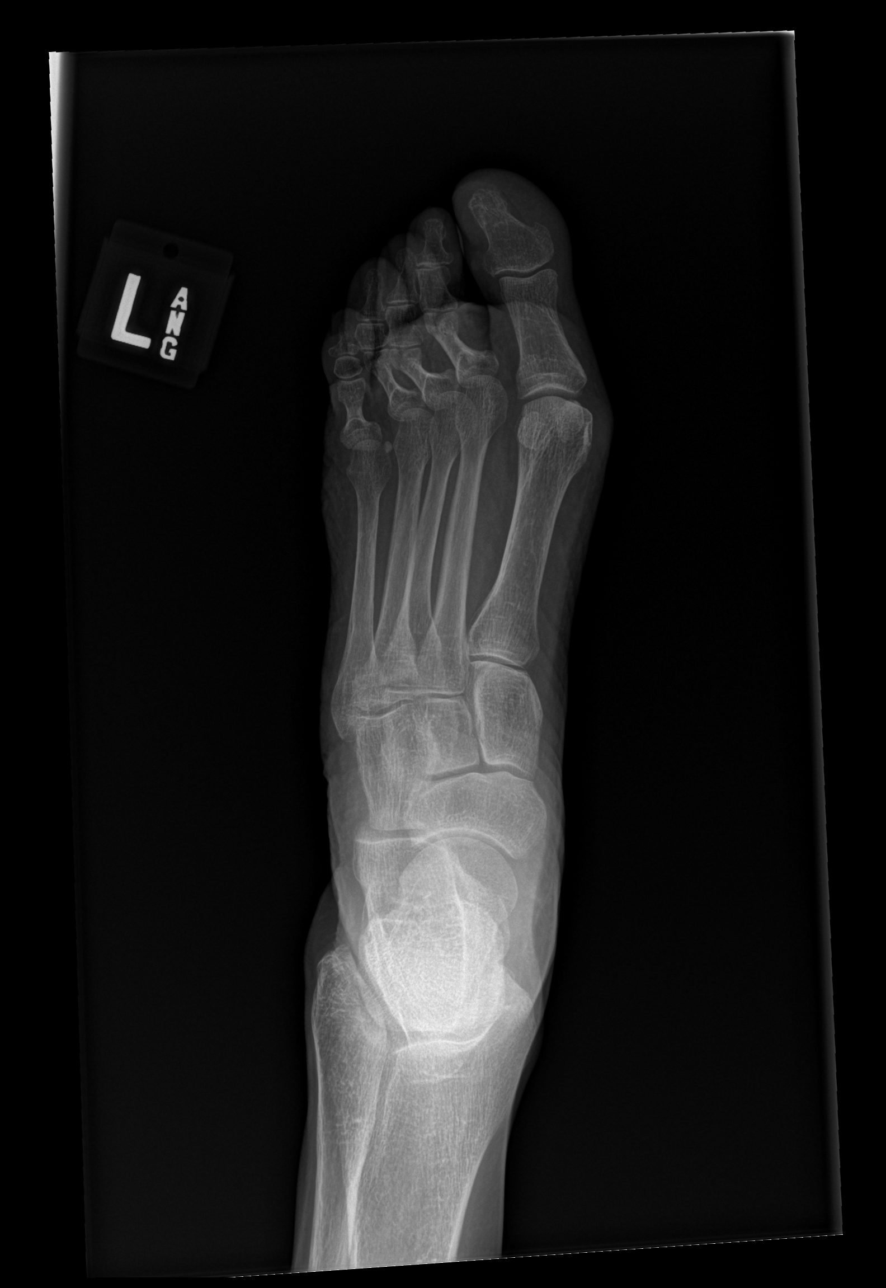

[x foot obl left]
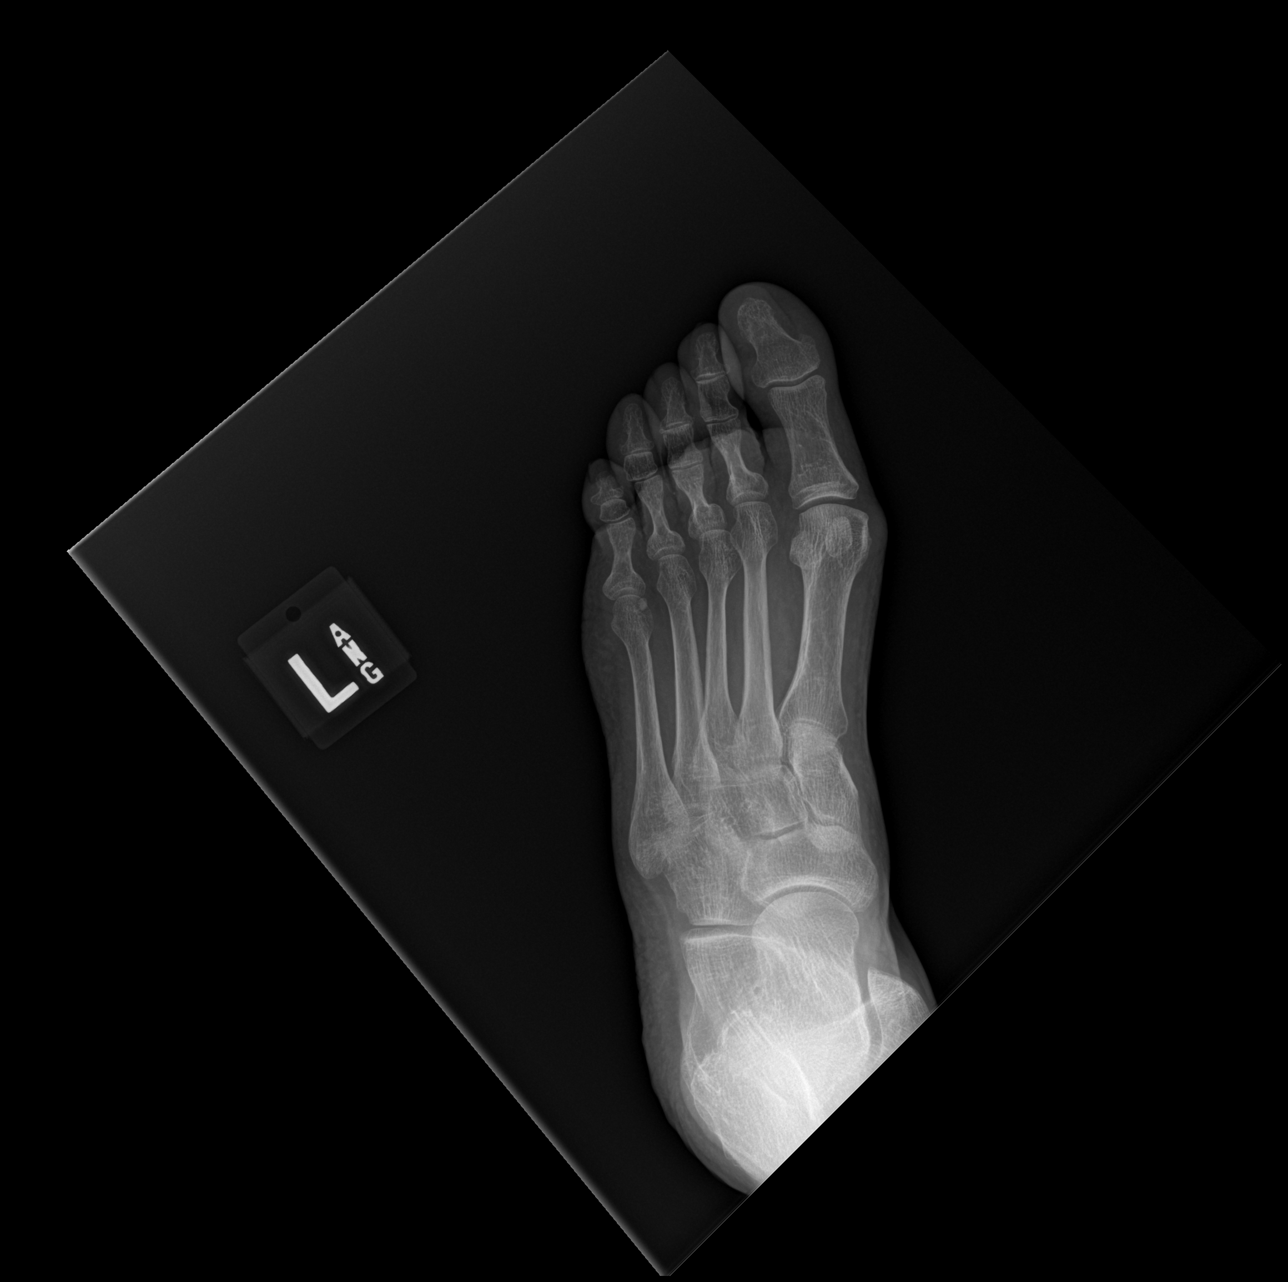

[x foot lat left]
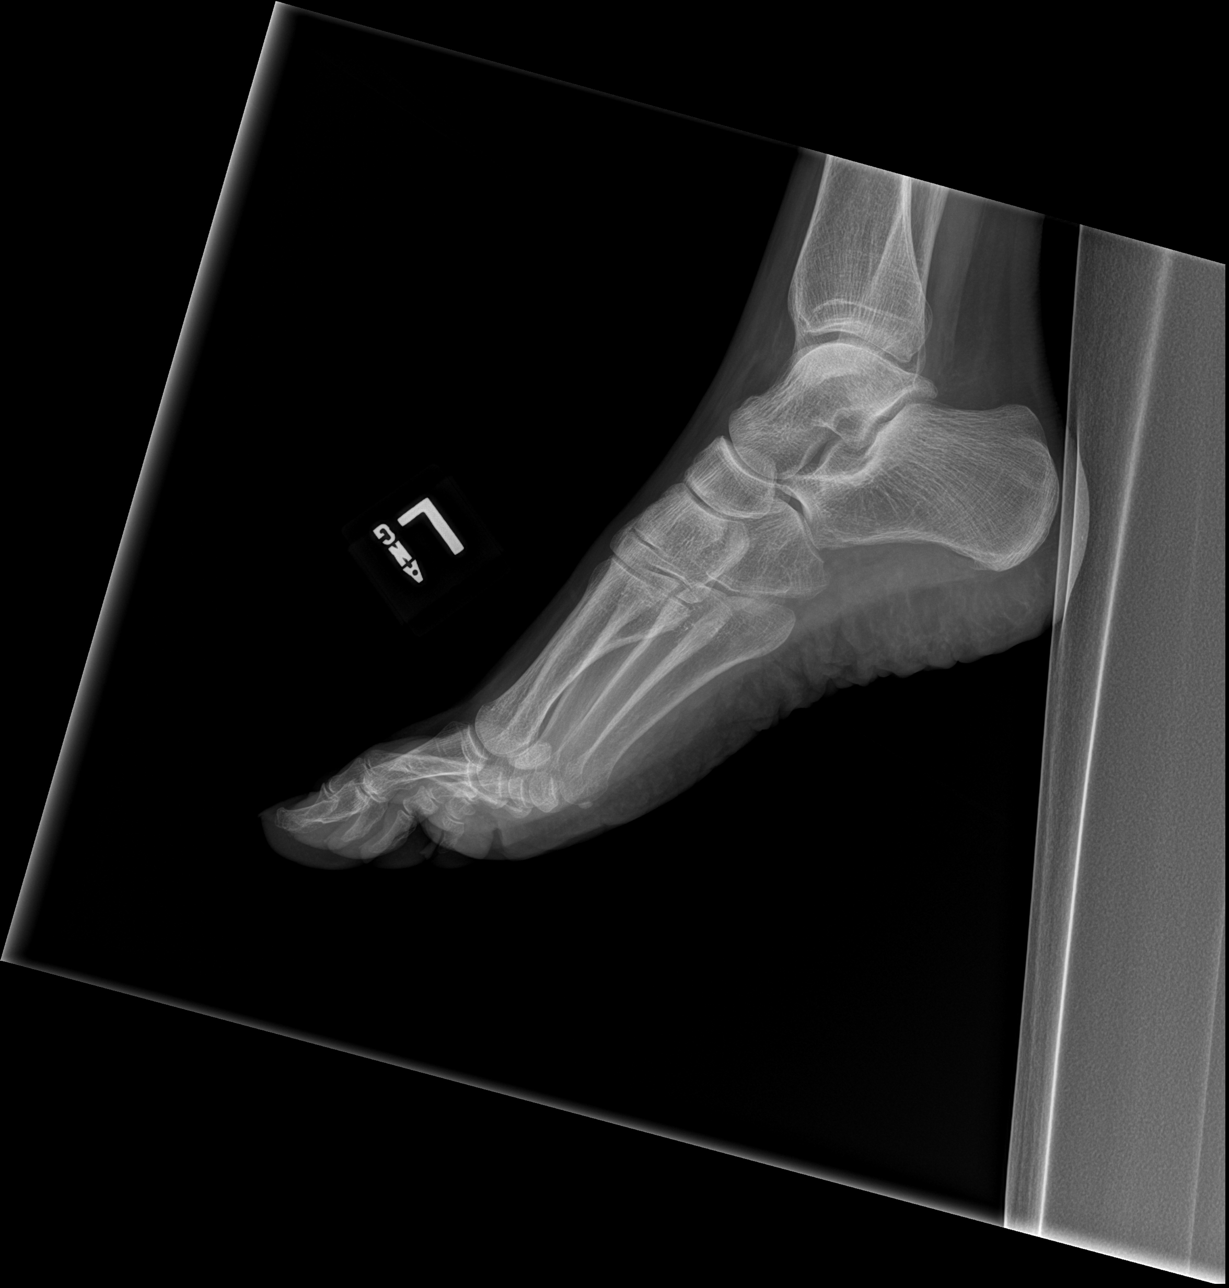

[3 of 3 positions shown; findings below may reference images not displayed]

FINDINGS: No fracture or dislocation. Very mild hallux valgus deformity. Joint
spaces are preserved. No erosions. No plantar calcaneal spur.
Regional soft tissues appear normal. No radiopaque foreign body.
IMPRESSION: No explanation for patient's left foot pain.

## 2021-02-20 IMAGING — CR LEFT FOREARM - 2 VIEW
2 series · 2 of 2 positions shown · non-contrast
Comparison: 09/09/2013

CLINICAL DATA: Post fall, now with abrasion to the posterior aspect
of the forearm.

EXAM:
LEFT FOREARM - 2 VIEW

[x forearm ap left]
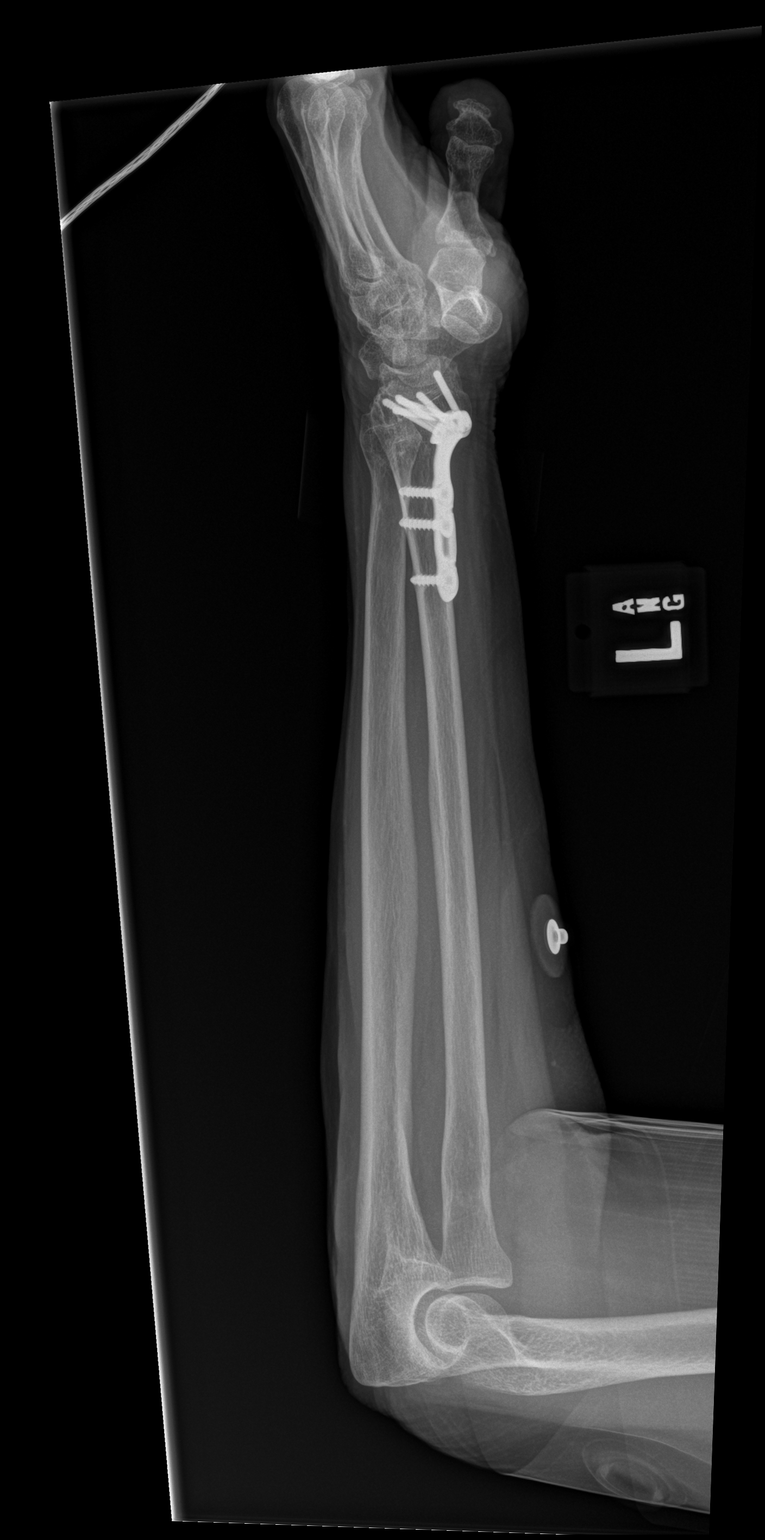

[x forearm left 0-3yrs]
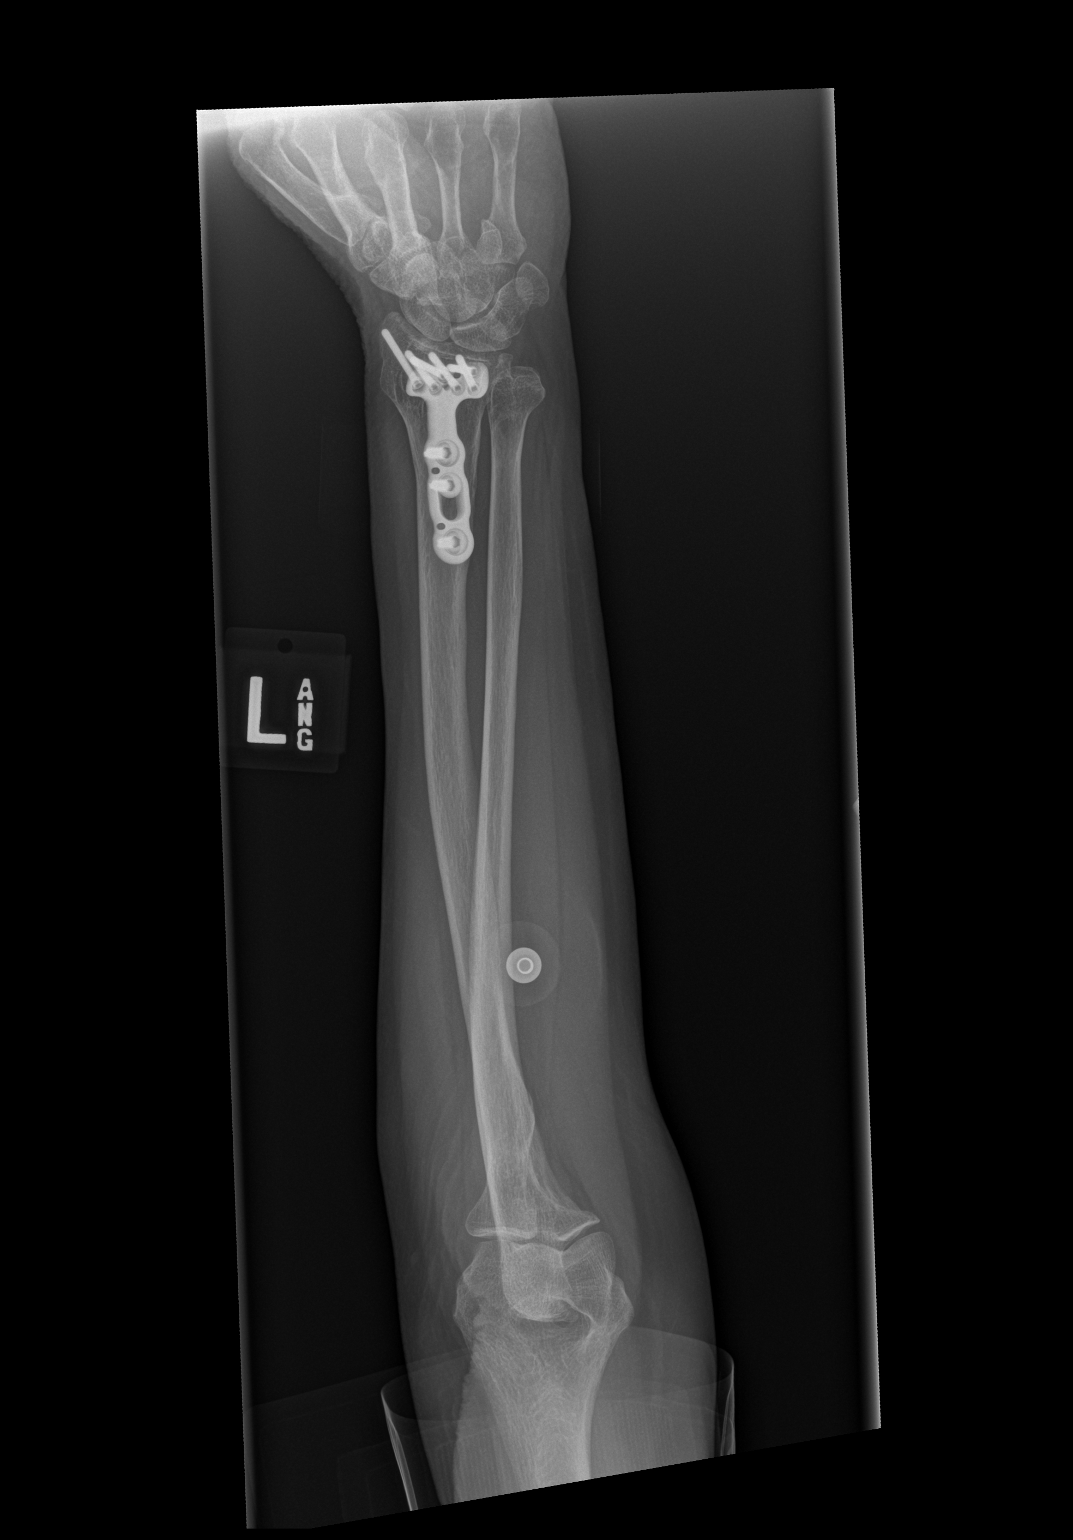

[2 of 2 positions shown; findings below may reference images not displayed]

FINDINGS: Post sideplate fixation of the distal radius without definite
evidence of hardware failure loosening.

No definite fracture or dislocation. No definite elbow joint
effusion.

Regional soft tissues appear normal with special attention paid to
the posterior aspect of the forearm. No radiopaque foreign body.
IMPRESSION: 1. No acute findings.
2. Post sideplate fixation of the distal radius without evidence of
hardware failure or loosening.

## 2021-02-20 IMAGING — CT CT CERVICAL SPINE WITHOUT CONTRAST
4 of 7 series · 15 of 33 positions shown, 16 images · non-contrast
Comparison: MRI brain 10/21/2018

CLINICAL DATA: Fall at 5 a.m. today. Left-sided pain. Hypotension.
Head trauma, minor.

EXAM:
CT HEAD WITHOUT CONTRAST
CT CERVICAL SPINE WITHOUT CONTRAST
TECHNIQUE: Multidetector CT imaging of the head and cervical spine was
performed following the standard protocol without intravenous
contrast. Multiplanar CT image reconstructions of the cervical spine
were also generated.

[Series 5: coronal soft tissue · coronal · 0.32mm/px · 3 of 67 slices shown]
[im 17/67  bone]
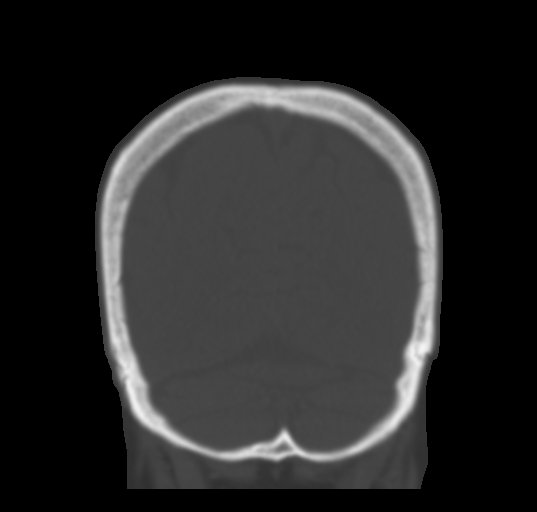
[im 34/67  bone]
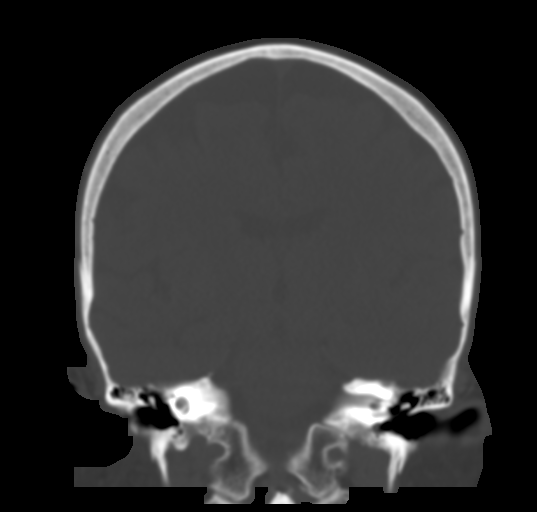
[im 50/67  bone]
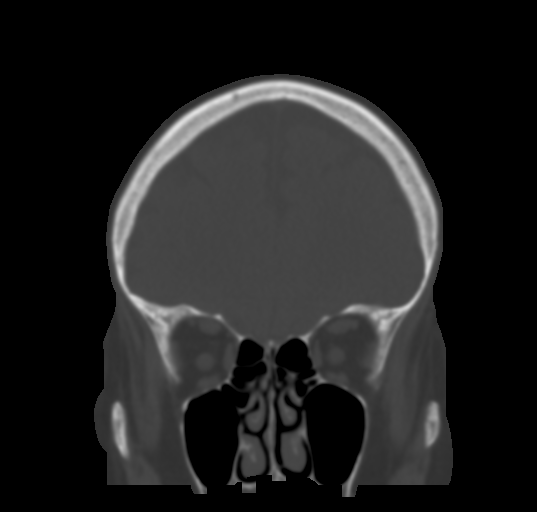

[Series 9: c spine soft · axial · 0.36mm/px · z∈[-238,-160]mm · 3 of 98 slices shown]
[im 20/98  soft-tissue]
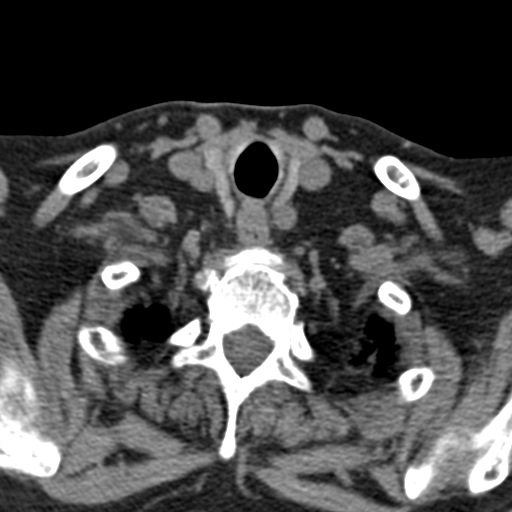
[im 39/98  soft-tissue]
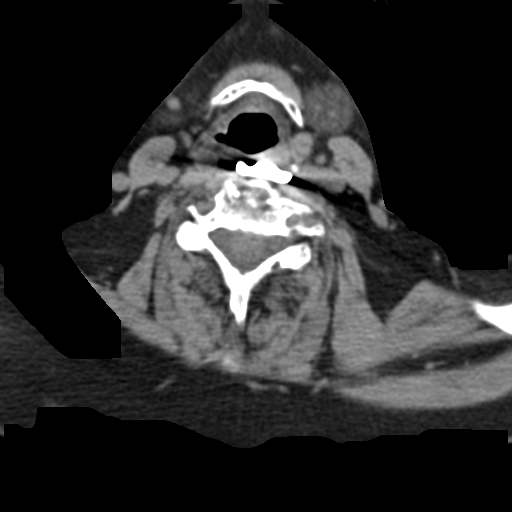
[im 59/98  soft-tissue]
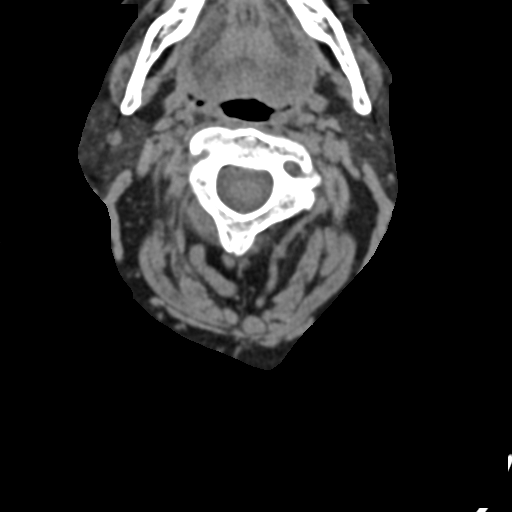

[Series 10: orthogonal bone · axial · 0.29mm/px · z∈[-261,-169]mm · 4 of 85 slices shown, 5 images]
[im 17/85  soft-tissue]
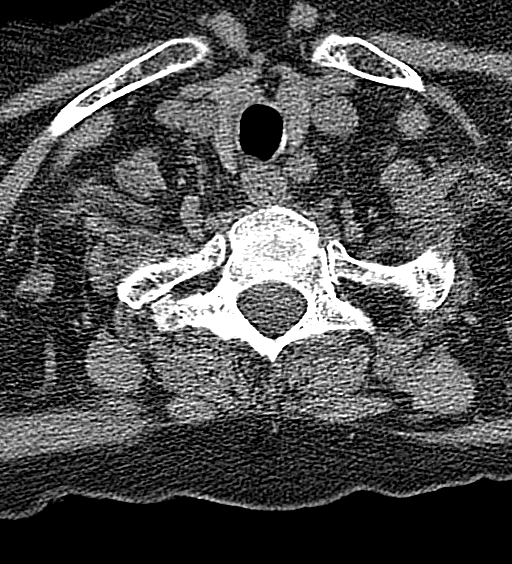
[im 17/85  bone]
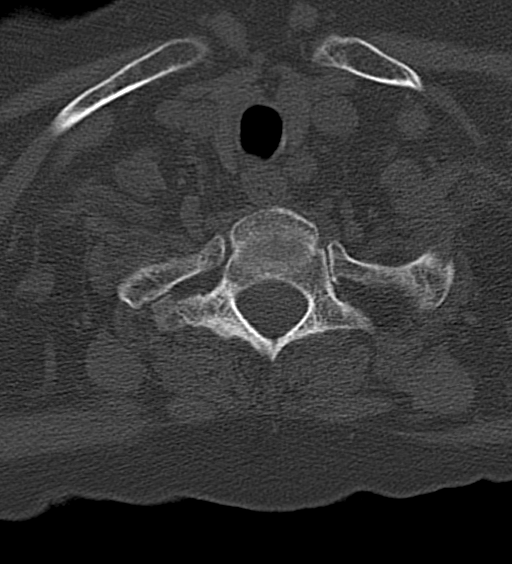
[im 34/85  bone]
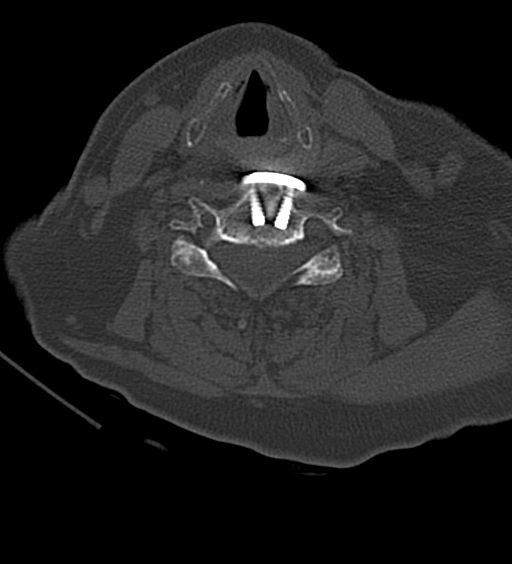
[im 51/85  bone]
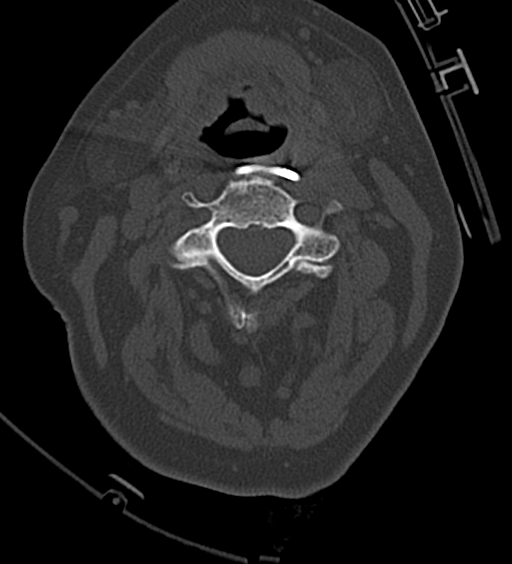
[im 68/85  bone]
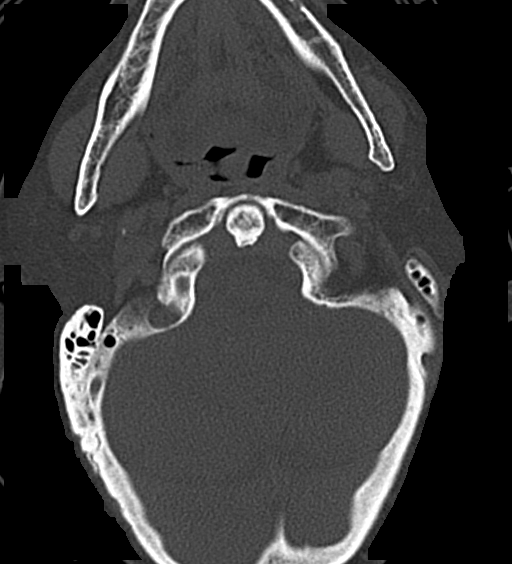

[Series 12: sagittal bone · sagittal · 0.29mm/px · 5 of 71 slices shown]
[im 12/71  bone]
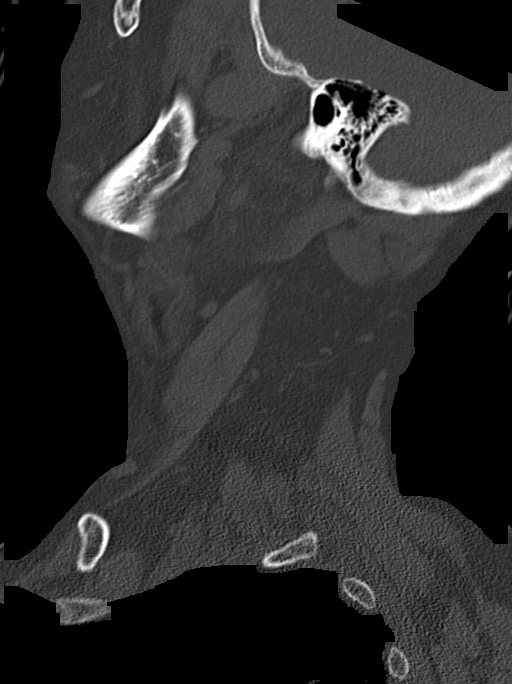
[im 24/71  bone]
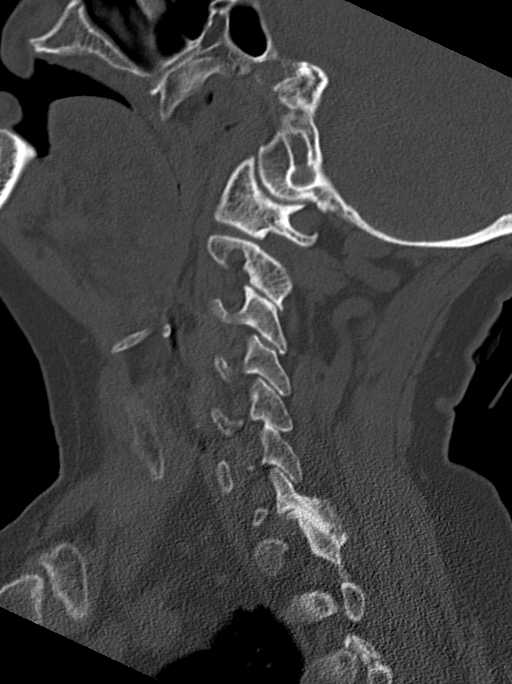
[im 36/71  bone]
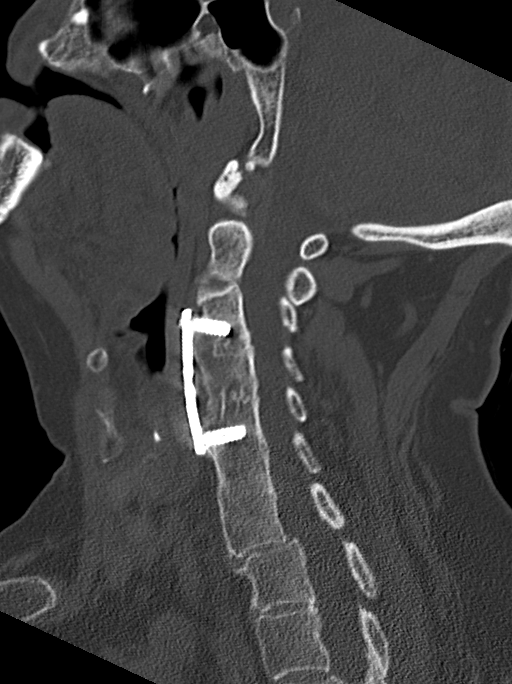
[im 47/71  bone]
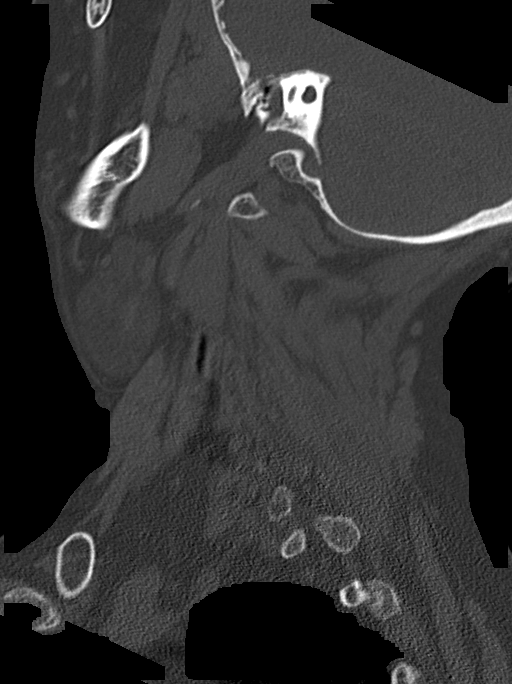
[im 59/71  bone]
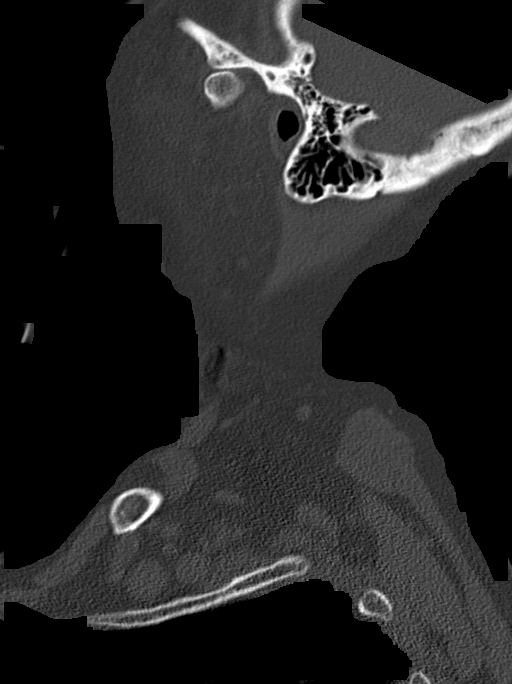

[15 of 33 positions shown; findings below may reference images not displayed]

FINDINGS: CT HEAD FINDINGS

Brain: Atrophy and white matter changes are stable. No acute
infarct, hemorrhage, or mass lesion is present. The ventricles are
of normal size. No significant extraaxial fluid collection is
present.

The brainstem and cerebellum are within normal limits.

Vascular: No hyperdense vessel or unexpected calcification.

Skull: No significant extracranial soft tissue injury is present.
Calvarium is intact. No focal lytic or blastic lesions are present.

Sinuses/Orbits: The paranasal sinuses and mastoid air cells are
clear. The globes and orbits are within normal limits.

Other: An unerupted tooth is evident in the left maxilla.

CT CERVICAL SPINE FINDINGS

Alignment: Grade 1 anterolisthesis is present at C7-T1. AP alignment
is otherwise anatomic.

Skull base and vertebrae: Craniocervical junction is within normal
limits. Vertebral body heights are maintained. No acute or healing
fractures are present.

Soft tissues and spinal canal: Soft tissues the neck are
unremarkable. No spinal canal hematoma is evident.

Disc levels: Solid fusion is present C3-7. Multilevel degenerative
changes are noted. Facet disease is greatest at C7-T1.

Upper chest: Lung apices are clear. Thoracic inlet is within normal
limits. There is some scarring at the lung apices.
IMPRESSION: 1. No acute trauma to the head or cervical spine.
2. Stable atrophy and white matter disease.
3. Postsurgical and degenerative changes of the cervical spine as
described.

## 2021-02-20 IMAGING — CR LEFT KNEE - COMPLETE 4+ VIEW
4 series · 4 of 4 positions shown · non-contrast
Comparison: None.

CLINICAL DATA: Post fall, now with left anterior knee pain.

EXAM:
LEFT KNEE - COMPLETE 4+ VIEW

[x knee ap left (1 of 3)]
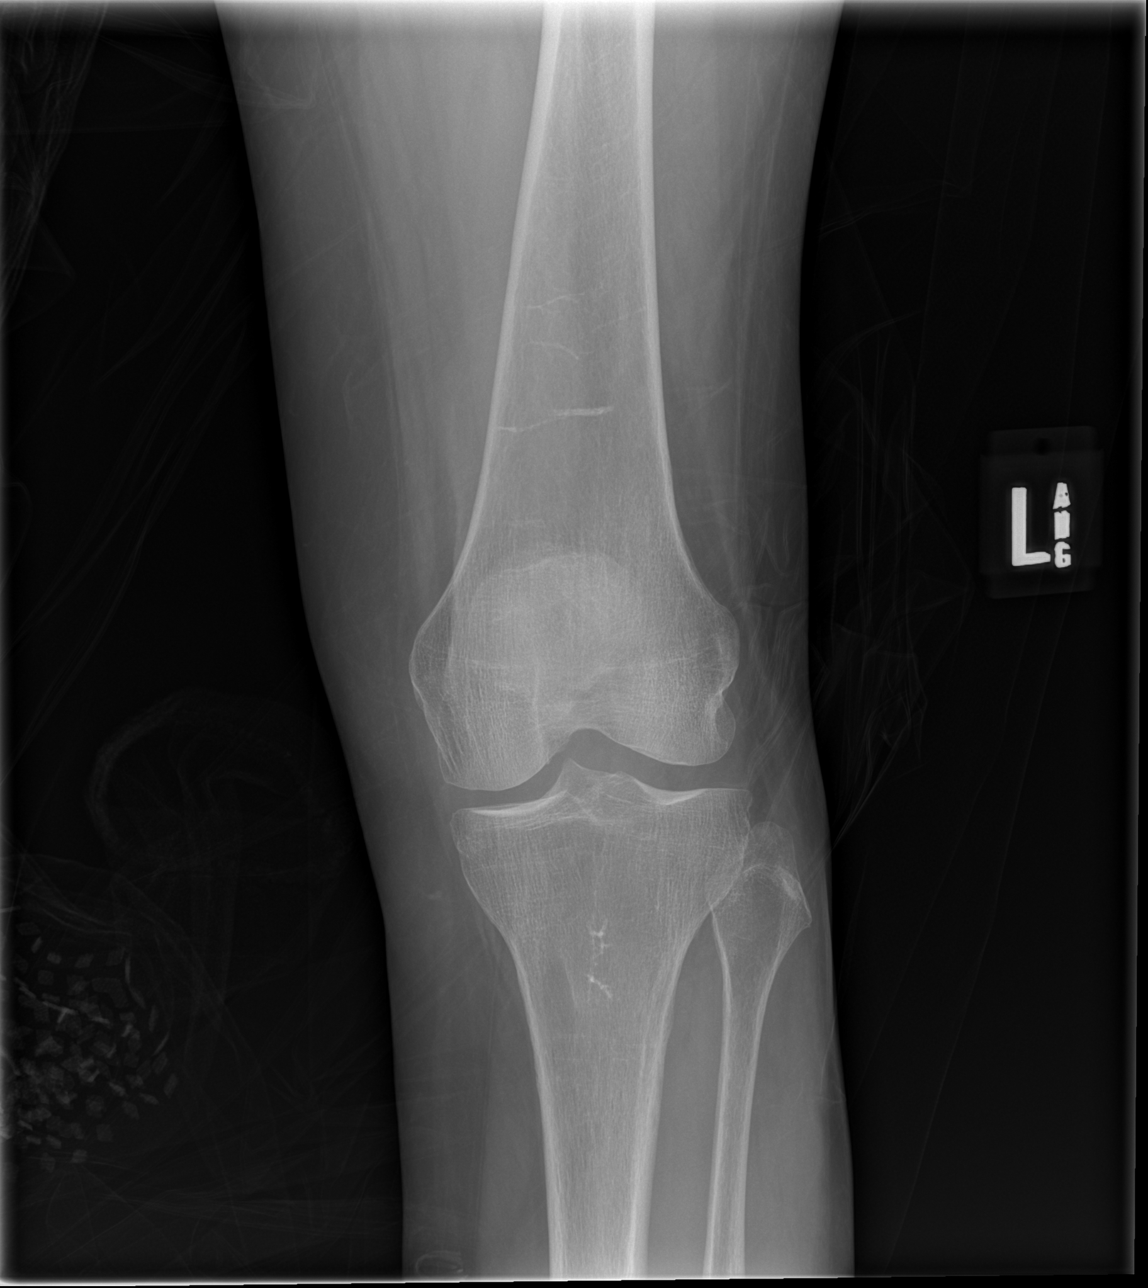

[x knee ap left (2 of 3)]
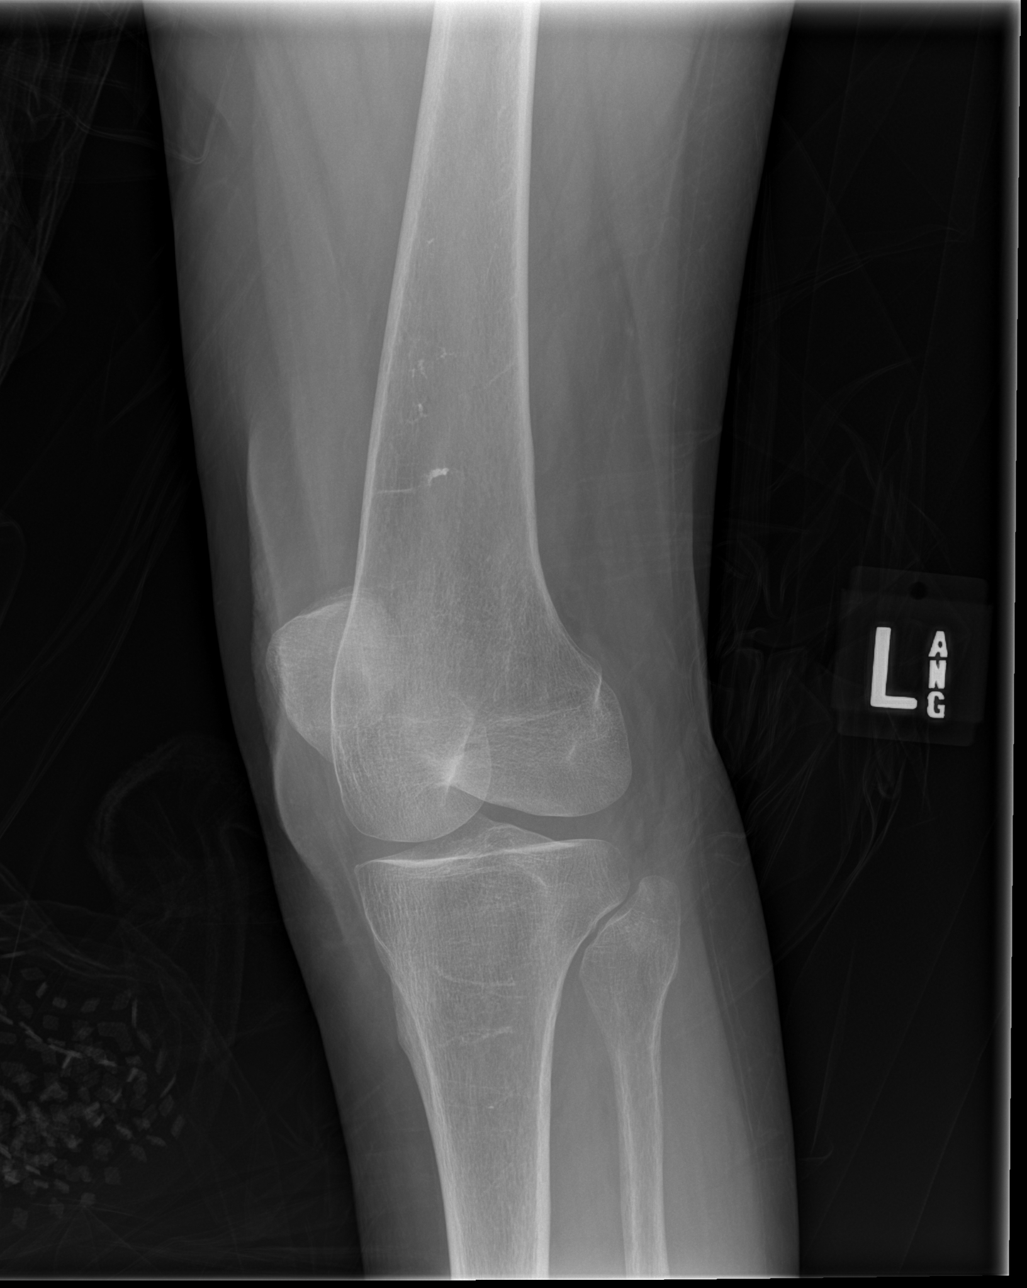

[x knee ap left (3 of 3)]
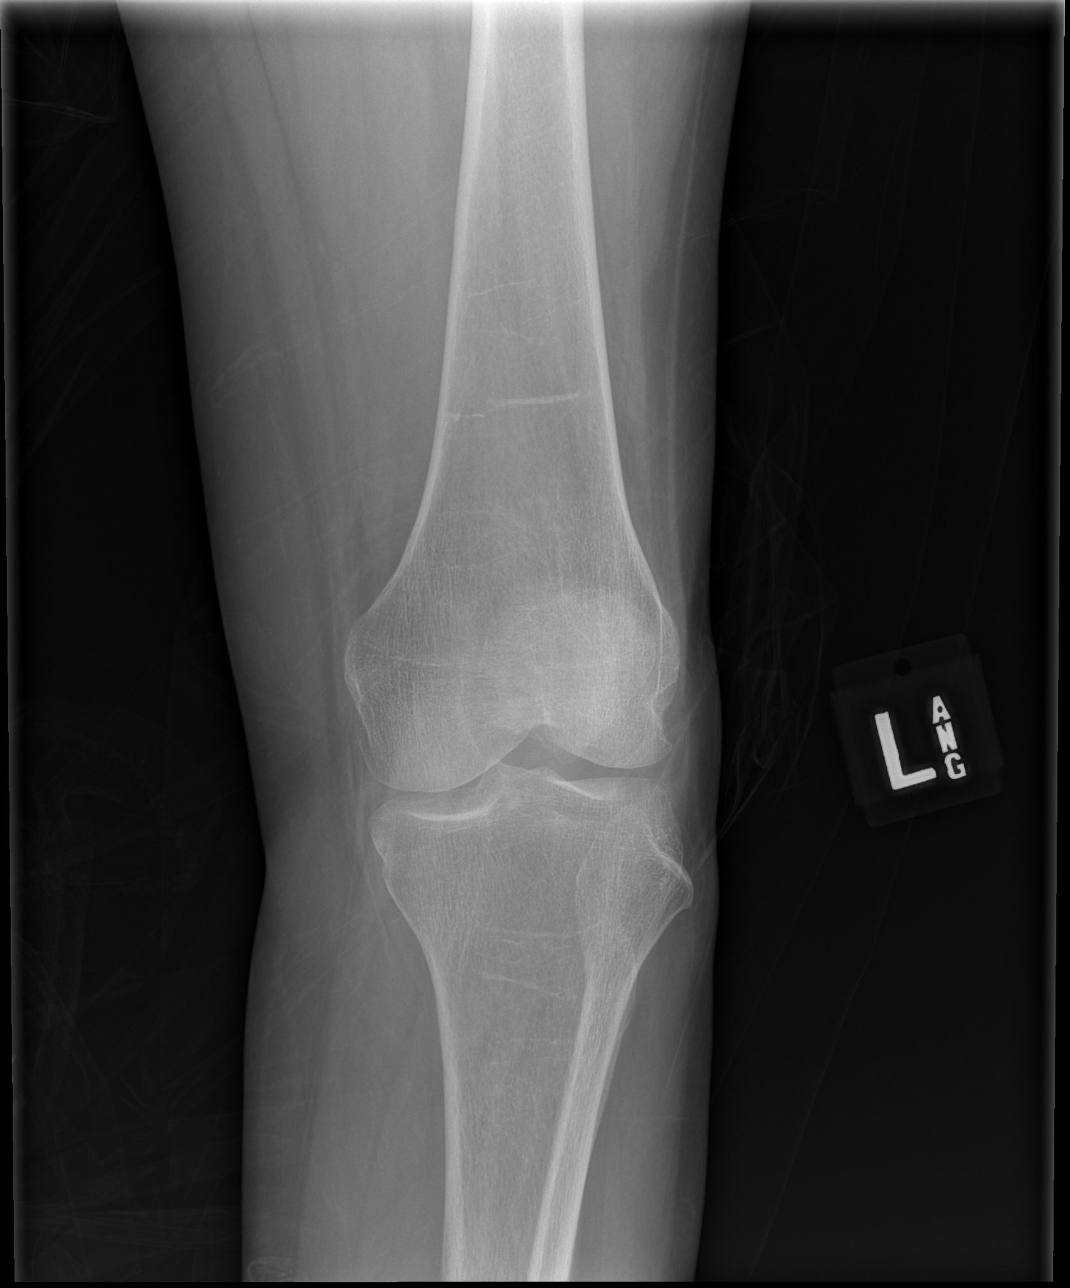

[x knee lat left]
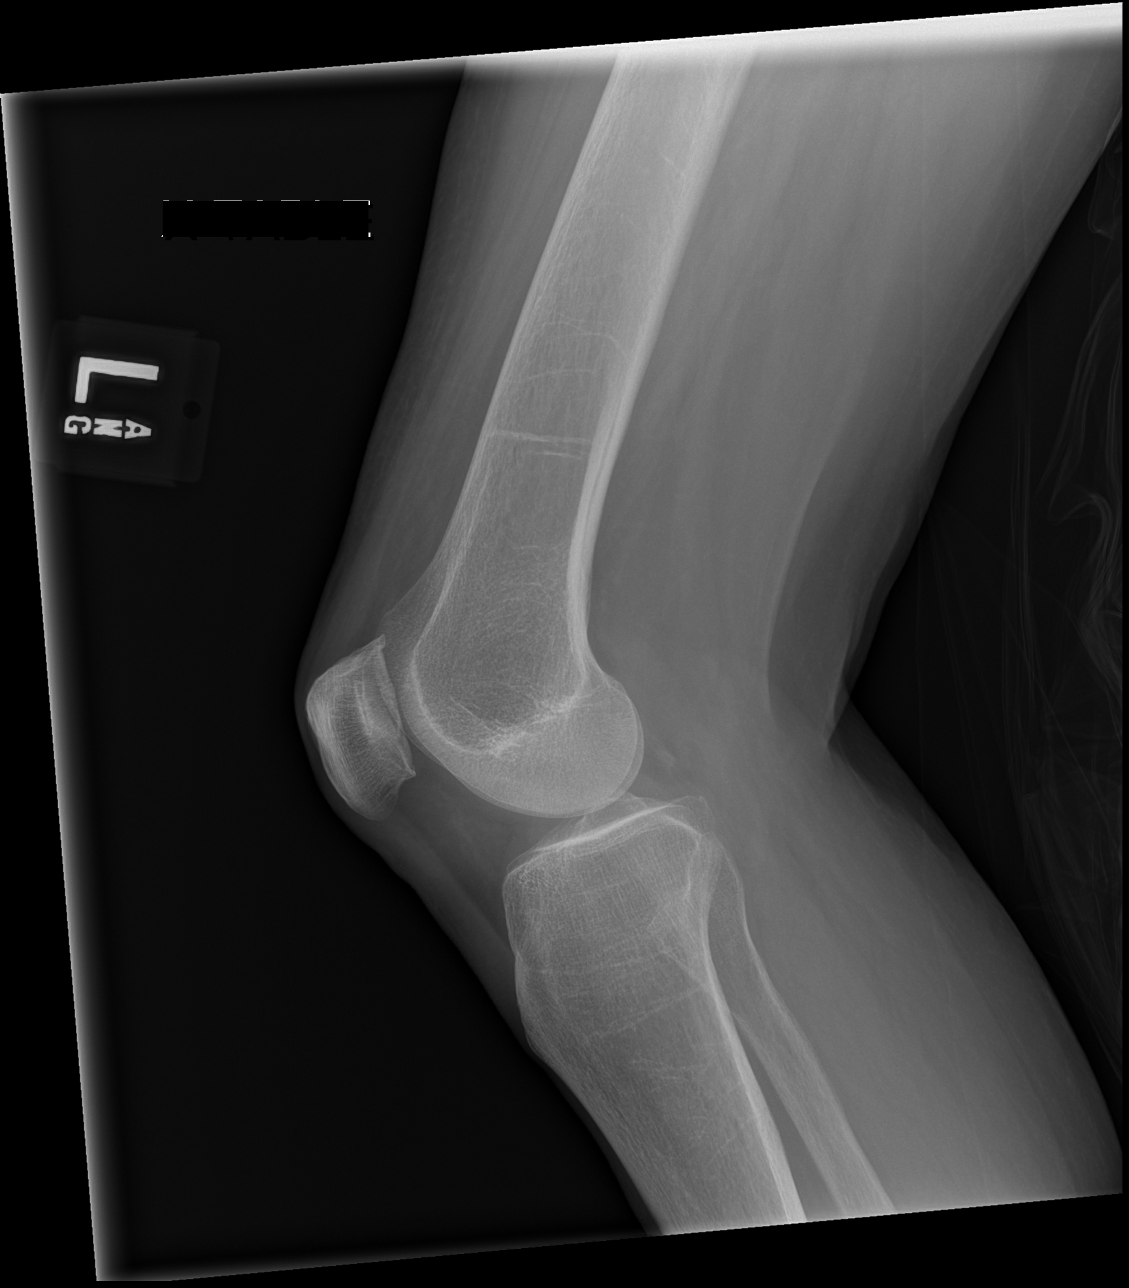

[4 of 4 positions shown; findings below may reference images not displayed]

FINDINGS: No fracture or dislocation. No definite knee joint effusion. Mild
degenerative change involving the patellofemoral joint with
articular surface irregularity, subchondral sclerosis and
osteophytosis. No evidence of chondrocalcinosis. Regional soft
tissues appear normal. No radiopaque foreign body.
IMPRESSION: 1. No acute findings.
2. Mild degenerative change of the patellofemoral joint.

## 2021-02-24 ENCOUNTER — Other Ambulatory Visit: Payer: Self-pay | Admitting: Internal Medicine

## 2021-02-27 ENCOUNTER — Ambulatory Visit: Payer: Self-pay | Admitting: *Deleted

## 2021-03-13 ENCOUNTER — Ambulatory Visit (INDEPENDENT_AMBULATORY_CARE_PROVIDER_SITE_OTHER): Payer: Medicare Other | Admitting: *Deleted

## 2021-03-13 DIAGNOSIS — J309 Allergic rhinitis, unspecified: Secondary | ICD-10-CM | POA: Diagnosis not present

## 2021-03-13 DIAGNOSIS — G43719 Chronic migraine without aura, intractable, without status migrainosus: Secondary | ICD-10-CM | POA: Diagnosis not present

## 2021-03-28 ENCOUNTER — Observation Stay (HOSPITAL_COMMUNITY): Payer: Medicare Other

## 2021-03-28 ENCOUNTER — Observation Stay (HOSPITAL_BASED_OUTPATIENT_CLINIC_OR_DEPARTMENT_OTHER): Payer: Medicare Other

## 2021-03-28 ENCOUNTER — Emergency Department (HOSPITAL_COMMUNITY): Payer: Medicare Other

## 2021-03-28 ENCOUNTER — Other Ambulatory Visit: Payer: Self-pay

## 2021-03-28 ENCOUNTER — Encounter (HOSPITAL_COMMUNITY): Payer: Self-pay | Admitting: Emergency Medicine

## 2021-03-28 ENCOUNTER — Inpatient Hospital Stay (HOSPITAL_COMMUNITY)
Admission: EM | Admit: 2021-03-28 | Discharge: 2021-04-02 | DRG: 641 | Disposition: A | Discharge status: Home Health | Payer: Medicare Other | Attending: Internal Medicine | Admitting: Internal Medicine

## 2021-03-28 DIAGNOSIS — F411 Generalized anxiety disorder: Secondary | ICD-10-CM | POA: Diagnosis not present

## 2021-03-28 DIAGNOSIS — Z888 Allergy status to other drugs, medicaments and biological substances status: Secondary | ICD-10-CM

## 2021-03-28 DIAGNOSIS — Z743 Need for continuous supervision: Secondary | ICD-10-CM | POA: Diagnosis not present

## 2021-03-28 DIAGNOSIS — I252 Old myocardial infarction: Secondary | ICD-10-CM

## 2021-03-28 DIAGNOSIS — E860 Dehydration: Principal | ICD-10-CM | POA: Diagnosis present

## 2021-03-28 DIAGNOSIS — I208 Other forms of angina pectoris: Secondary | ICD-10-CM

## 2021-03-28 DIAGNOSIS — Z91030 Bee allergy status: Secondary | ICD-10-CM

## 2021-03-28 DIAGNOSIS — R55 Syncope and collapse: Secondary | ICD-10-CM | POA: Diagnosis not present

## 2021-03-28 DIAGNOSIS — G4700 Insomnia, unspecified: Secondary | ICD-10-CM | POA: Diagnosis present

## 2021-03-28 DIAGNOSIS — I700 Atherosclerosis of aorta: Secondary | ICD-10-CM | POA: Diagnosis present

## 2021-03-28 DIAGNOSIS — R0789 Other chest pain: Secondary | ICD-10-CM | POA: Diagnosis not present

## 2021-03-28 DIAGNOSIS — Z79899 Other long term (current) drug therapy: Secondary | ICD-10-CM | POA: Diagnosis not present

## 2021-03-28 DIAGNOSIS — Z90710 Acquired absence of both cervix and uterus: Secondary | ICD-10-CM

## 2021-03-28 DIAGNOSIS — R079 Chest pain, unspecified: Secondary | ICD-10-CM | POA: Diagnosis present

## 2021-03-28 DIAGNOSIS — N179 Acute kidney failure, unspecified: Secondary | ICD-10-CM | POA: Diagnosis not present

## 2021-03-28 DIAGNOSIS — F17210 Nicotine dependence, cigarettes, uncomplicated: Secondary | ICD-10-CM | POA: Diagnosis present

## 2021-03-28 DIAGNOSIS — R0602 Shortness of breath: Secondary | ICD-10-CM | POA: Diagnosis not present

## 2021-03-28 DIAGNOSIS — Z825 Family history of asthma and other chronic lower respiratory diseases: Secondary | ICD-10-CM

## 2021-03-28 DIAGNOSIS — Z20822 Contact with and (suspected) exposure to covid-19: Secondary | ICD-10-CM | POA: Diagnosis not present

## 2021-03-28 DIAGNOSIS — F32A Depression, unspecified: Secondary | ICD-10-CM | POA: Diagnosis not present

## 2021-03-28 DIAGNOSIS — K219 Gastro-esophageal reflux disease without esophagitis: Secondary | ICD-10-CM | POA: Diagnosis present

## 2021-03-28 DIAGNOSIS — J90 Pleural effusion, not elsewhere classified: Secondary | ICD-10-CM | POA: Diagnosis not present

## 2021-03-28 DIAGNOSIS — I2510 Atherosclerotic heart disease of native coronary artery without angina pectoris: Secondary | ICD-10-CM | POA: Diagnosis present

## 2021-03-28 DIAGNOSIS — K529 Noninfective gastroenteritis and colitis, unspecified: Secondary | ICD-10-CM | POA: Diagnosis not present

## 2021-03-28 DIAGNOSIS — R112 Nausea with vomiting, unspecified: Secondary | ICD-10-CM

## 2021-03-28 DIAGNOSIS — R262 Difficulty in walking, not elsewhere classified: Secondary | ICD-10-CM | POA: Diagnosis not present

## 2021-03-28 DIAGNOSIS — R42 Dizziness and giddiness: Secondary | ICD-10-CM | POA: Diagnosis not present

## 2021-03-28 DIAGNOSIS — R778 Other specified abnormalities of plasma proteins: Secondary | ICD-10-CM | POA: Diagnosis not present

## 2021-03-28 DIAGNOSIS — Z9049 Acquired absence of other specified parts of digestive tract: Secondary | ICD-10-CM | POA: Diagnosis not present

## 2021-03-28 DIAGNOSIS — R269 Unspecified abnormalities of gait and mobility: Secondary | ICD-10-CM | POA: Diagnosis not present

## 2021-03-28 DIAGNOSIS — M5136 Other intervertebral disc degeneration, lumbar region: Secondary | ICD-10-CM | POA: Diagnosis present

## 2021-03-28 DIAGNOSIS — Z881 Allergy status to other antibiotic agents status: Secondary | ICD-10-CM

## 2021-03-28 DIAGNOSIS — J439 Emphysema, unspecified: Secondary | ICD-10-CM | POA: Diagnosis not present

## 2021-03-28 DIAGNOSIS — J449 Chronic obstructive pulmonary disease, unspecified: Secondary | ICD-10-CM | POA: Diagnosis not present

## 2021-03-28 DIAGNOSIS — R030 Elevated blood-pressure reading, without diagnosis of hypertension: Secondary | ICD-10-CM | POA: Diagnosis present

## 2021-03-28 DIAGNOSIS — J309 Allergic rhinitis, unspecified: Secondary | ICD-10-CM | POA: Diagnosis present

## 2021-03-28 DIAGNOSIS — Z8249 Family history of ischemic heart disease and other diseases of the circulatory system: Secondary | ICD-10-CM

## 2021-03-28 DIAGNOSIS — I499 Cardiac arrhythmia, unspecified: Secondary | ICD-10-CM | POA: Diagnosis not present

## 2021-03-28 DIAGNOSIS — Z885 Allergy status to narcotic agent status: Secondary | ICD-10-CM

## 2021-03-28 DIAGNOSIS — R059 Cough, unspecified: Secondary | ICD-10-CM | POA: Diagnosis present

## 2021-03-28 DIAGNOSIS — Z91040 Latex allergy status: Secondary | ICD-10-CM

## 2021-03-28 DIAGNOSIS — E785 Hyperlipidemia, unspecified: Secondary | ICD-10-CM | POA: Diagnosis present

## 2021-03-28 DIAGNOSIS — J4489 Other specified chronic obstructive pulmonary disease: Secondary | ICD-10-CM | POA: Diagnosis present

## 2021-03-28 DIAGNOSIS — E538 Deficiency of other specified B group vitamins: Secondary | ICD-10-CM | POA: Diagnosis present

## 2021-03-28 DIAGNOSIS — R6889 Other general symptoms and signs: Secondary | ICD-10-CM | POA: Diagnosis not present

## 2021-03-28 HISTORY — PX: TRANSTHORACIC ECHOCARDIOGRAM: SHX275

## 2021-03-28 LAB — BASIC METABOLIC PANEL
Anion gap: 9 (ref 5–15)
BUN: 16 mg/dL (ref 8–23)
CO2: 22 mmol/L (ref 22–32)
Calcium: 9.3 mg/dL (ref 8.9–10.3)
Chloride: 106 mmol/L (ref 98–111)
Creatinine, Ser: 1.99 mg/dL — ABNORMAL HIGH (ref 0.44–1.00)
GFR, Estimated: 28 mL/min — ABNORMAL LOW (ref >60)
Glucose, Bld: 101 mg/dL — ABNORMAL HIGH (ref 70–99)
Potassium: 4.2 mmol/L (ref 3.5–5.1)
Sodium: 137 mmol/L (ref 135–145)

## 2021-03-28 LAB — TROPONIN I (HIGH SENSITIVITY)
Troponin I (High Sensitivity): <2 ng/L (ref <18)
Troponin I (High Sensitivity): 2 ng/L (ref <18)

## 2021-03-28 LAB — URINALYSIS, ROUTINE W REFLEX MICROSCOPIC
Bilirubin Urine: NEGATIVE
Glucose, UA: NEGATIVE mg/dL
Ketones, ur: NEGATIVE mg/dL
Leukocytes,Ua: NEGATIVE
Nitrite: NEGATIVE
Protein, ur: NEGATIVE mg/dL
Specific Gravity, Urine: 1.005 (ref 1.005–1.030)
pH: 5 (ref 5.0–8.0)

## 2021-03-28 LAB — RAPID URINE DRUG SCREEN, HOSP PERFORMED
Amphetamines: NOT DETECTED
Barbiturates: NOT DETECTED
Benzodiazepines: NOT DETECTED
Cocaine: NOT DETECTED
Opiates: NOT DETECTED
Tetrahydrocannabinol: NOT DETECTED

## 2021-03-28 LAB — ECHOCARDIOGRAM COMPLETE
AR max vel: 2.61 cm2
AV Area VTI: 2.46 cm2
AV Area mean vel: 2.58 cm2
AV Mean grad: 4 mmHg
AV Peak grad: 6.3 mmHg
Ao pk vel: 1.25 m/s
Area-P 1/2: 3.53 cm2
Height: 64 in
S' Lateral: 2.9 cm
Weight: 2000 oz

## 2021-03-28 LAB — CBC WITH DIFFERENTIAL/PLATELET
Abs Immature Granulocytes: 0.06 10*3/uL (ref 0.00–0.07)
Basophils Absolute: 0 10*3/uL (ref 0.0–0.1)
Basophils Relative: 0 %
Eosinophils Absolute: 0 10*3/uL (ref 0.0–0.5)
Eosinophils Relative: 0 %
HCT: 41.2 % (ref 36.0–46.0)
Hemoglobin: 14.4 g/dL (ref 12.0–15.0)
Immature Granulocytes: 1 %
Lymphocytes Relative: 30 %
Lymphs Abs: 2.5 10*3/uL (ref 0.7–4.0)
MCH: 33.3 pg (ref 26.0–34.0)
MCHC: 35 g/dL (ref 30.0–36.0)
MCV: 95.2 fL (ref 80.0–100.0)
Monocytes Absolute: 0.5 10*3/uL (ref 0.1–1.0)
Monocytes Relative: 6 %
Neutro Abs: 5.2 10*3/uL (ref 1.7–7.7)
Neutrophils Relative %: 63 %
Platelets: 270 10*3/uL (ref 150–400)
RBC: 4.33 MIL/uL (ref 3.87–5.11)
RDW: 13.2 % (ref 11.5–15.5)
WBC: 8.3 10*3/uL (ref 4.0–10.5)
nRBC: 0 % (ref 0.0–0.2)

## 2021-03-28 LAB — CBC
HCT: 43.3 % (ref 36.0–46.0)
Hemoglobin: 15.2 g/dL — ABNORMAL HIGH (ref 12.0–15.0)
MCH: 33.4 pg (ref 26.0–34.0)
MCHC: 35.1 g/dL (ref 30.0–36.0)
MCV: 95.2 fL (ref 80.0–100.0)
Platelets: 245 10*3/uL (ref 150–400)
RBC: 4.55 MIL/uL (ref 3.87–5.11)
RDW: 13.4 % (ref 11.5–15.5)
WBC: 7.7 10*3/uL (ref 4.0–10.5)
nRBC: 0 % (ref 0.0–0.2)

## 2021-03-28 LAB — HIV ANTIBODY (ROUTINE TESTING W REFLEX): HIV Screen 4th Generation wRfx: NONREACTIVE

## 2021-03-28 LAB — CREATININE, SERUM
Creatinine, Ser: 1.68 mg/dL — ABNORMAL HIGH (ref 0.44–1.00)
GFR, Estimated: 34 mL/min — ABNORMAL LOW (ref >60)

## 2021-03-28 LAB — RESP PANEL BY RT-PCR (FLU A&B, COVID) ARPGX2
Influenza A by PCR: NEGATIVE
Influenza B by PCR: NEGATIVE
SARS Coronavirus 2 by RT PCR: NEGATIVE

## 2021-03-28 LAB — ETHANOL: Alcohol, Ethyl (B): <10 mg/dL (ref <10)

## 2021-03-28 LAB — TSH: TSH: 1.028 u[IU]/mL (ref 0.350–4.500)

## 2021-03-28 MED ORDER — LORATADINE 10 MG PO TABS
10.0000 mg | ORAL_TABLET | Freq: Every day | ORAL | Status: DC
  Administered 2021-03-28 – 2021-04-02 (×6): 10 mg via ORAL
  Filled 2021-03-28 (×6): qty 1

## 2021-03-28 MED ORDER — PANTOPRAZOLE SODIUM 40 MG PO TBEC
40.0000 mg | DELAYED_RELEASE_TABLET | Freq: Every day | ORAL | Status: DC
  Administered 2021-03-28 – 2021-04-02 (×6): 40 mg via ORAL
  Filled 2021-03-28 (×6): qty 1

## 2021-03-28 MED ORDER — MECLIZINE HCL 25 MG PO TABS: 25.0000 mg | ORAL_TABLET | Freq: Three times a day (TID) | ORAL | Status: DC | PRN

## 2021-03-28 MED ORDER — GUAIFENESIN-DM 100-10 MG/5ML PO SYRP
5.0000 mL | ORAL_SOLUTION | ORAL | Status: DC | PRN
  Administered 2021-03-30: 5 mL via ORAL
  Filled 2021-03-28: qty 5

## 2021-03-28 MED ORDER — GUAIFENESIN ER 600 MG PO TB12
1200.0000 mg | ORAL_TABLET | Freq: Two times a day (BID) | ORAL | Status: DC
  Administered 2021-03-28 – 2021-03-29 (×3): 1200 mg via ORAL
  Filled 2021-03-28 (×3): qty 2

## 2021-03-28 MED ORDER — DOXYCYCLINE HYCLATE 100 MG PO TABS
100.0000 mg | ORAL_TABLET | Freq: Two times a day (BID) | ORAL | Status: DC
  Administered 2021-03-28 – 2021-03-29 (×3): 100 mg via ORAL
  Filled 2021-03-28 (×3): qty 1

## 2021-03-28 MED ORDER — VALACYCLOVIR HCL 500 MG PO TABS
1000.0000 mg | ORAL_TABLET | Freq: Two times a day (BID) | ORAL | Status: DC
  Administered 2021-03-28 – 2021-04-02 (×11): 1000 mg via ORAL
  Filled 2021-03-28 (×11): qty 2

## 2021-03-28 MED ORDER — ALPRAZOLAM 1 MG PO TABS
1.0000 mg | ORAL_TABLET | Freq: Every day | ORAL | Status: DC
  Administered 2021-03-28 – 2021-04-01 (×5): 1 mg via ORAL
  Filled 2021-03-28 (×4): qty 1
  Filled 2021-03-28: qty 2

## 2021-03-28 MED ORDER — LEVALBUTEROL TARTRATE 45 MCG/ACT IN AERO: 2.0000 | INHALATION_SPRAY | Freq: Two times a day (BID) | RESPIRATORY_TRACT | Status: DC

## 2021-03-28 MED ORDER — GABAPENTIN 300 MG PO CAPS
300.0000 mg | ORAL_CAPSULE | Freq: Two times a day (BID) | ORAL | Status: DC
  Administered 2021-03-28 – 2021-04-02 (×11): 300 mg via ORAL
  Filled 2021-03-28 (×11): qty 1

## 2021-03-28 MED ORDER — LACTATED RINGERS IV SOLN: INTRAVENOUS | Status: AC

## 2021-03-28 MED ORDER — MOMETASONE FURO-FORMOTEROL FUM 200-5 MCG/ACT IN AERO
2.0000 | INHALATION_SPRAY | Freq: Two times a day (BID) | RESPIRATORY_TRACT | Status: DC
  Administered 2021-03-28 – 2021-04-01 (×9): 2 via RESPIRATORY_TRACT
  Filled 2021-03-28: qty 8.8

## 2021-03-28 MED ORDER — ALBUTEROL SULFATE (2.5 MG/3ML) 0.083% IN NEBU
2.5000 mg | INHALATION_SOLUTION | Freq: Two times a day (BID) | RESPIRATORY_TRACT | Status: DC
  Administered 2021-03-28 – 2021-03-29 (×3): 2.5 mg via RESPIRATORY_TRACT
  Filled 2021-03-28 (×2): qty 3

## 2021-03-28 MED ORDER — ROSUVASTATIN CALCIUM 20 MG PO TABS
40.0000 mg | ORAL_TABLET | Freq: Every day | ORAL | Status: DC
  Administered 2021-03-28 – 2021-04-02 (×6): 40 mg via ORAL
  Filled 2021-03-28: qty 4
  Filled 2021-03-28: qty 2
  Filled 2021-03-28: qty 4
  Filled 2021-03-28 (×4): qty 2
  Filled 2021-03-28: qty 4

## 2021-03-28 MED ORDER — LEVALBUTEROL TARTRATE 45 MCG/ACT IN AERO
2.0000 | INHALATION_SPRAY | Freq: Two times a day (BID) | RESPIRATORY_TRACT | Status: DC
  Filled 2021-03-28: qty 15

## 2021-03-28 MED ORDER — BENZONATATE 100 MG PO CAPS
200.0000 mg | ORAL_CAPSULE | Freq: Three times a day (TID) | ORAL | Status: DC
  Administered 2021-03-28 – 2021-03-29 (×4): 200 mg via ORAL
  Filled 2021-03-28 (×4): qty 2

## 2021-03-28 MED ORDER — LACTATED RINGERS IV BOLUS
1000.0000 mL | Freq: Once | INTRAVENOUS | Status: AC
  Administered 2021-03-28: 1000 mL via INTRAVENOUS

## 2021-03-28 MED ORDER — HEPARIN SODIUM (PORCINE) 5000 UNIT/ML IJ SOLN
5000.0000 [IU] | Freq: Three times a day (TID) | INTRAMUSCULAR | Status: DC
  Administered 2021-03-28 – 2021-04-02 (×15): 5000 [IU] via SUBCUTANEOUS
  Filled 2021-03-28 (×15): qty 1

## 2021-03-28 MED ORDER — CYCLOBENZAPRINE HCL 5 MG PO TABS
5.0000 mg | ORAL_TABLET | Freq: Three times a day (TID) | ORAL | Status: DC | PRN
  Administered 2021-03-30 – 2021-04-01 (×2): 5 mg via ORAL
  Filled 2021-03-28 (×2): qty 1

## 2021-03-28 MED ORDER — ASPIRIN 81 MG PO CHEW
324.0000 mg | CHEWABLE_TABLET | Freq: Once | ORAL | Status: AC
  Administered 2021-03-28: 324 mg via ORAL
  Filled 2021-03-28: qty 4

## 2021-03-28 MED ORDER — MONTELUKAST SODIUM 10 MG PO TABS
10.0000 mg | ORAL_TABLET | Freq: Every day | ORAL | Status: DC
  Administered 2021-03-28 – 2021-04-01 (×5): 10 mg via ORAL
  Filled 2021-03-28 (×5): qty 1

## 2021-03-28 MED ORDER — GUAIFENESIN-DM 100-10 MG/5ML PO SYRP: 5.0000 mL | ORAL_SOLUTION | ORAL | Status: DC | PRN

## 2021-03-28 NOTE — Progress Notes (Signed)
Cardiology f/u placed on AVS.

## 2021-03-28 NOTE — Consult Note (Signed)
CONSULTATION NOTE   Patient Name: Lori [name] Date of Encounter: 03/28/2021 Cardiologist: Glenetta Hew, MD Electrophysiologist: None Advanced Heart Failure: None   Chief Complaint   Chest pain  Patient Profile   65 year old female with history of COPD and multiple cardiovascular risk factors with negative work-up of ongoing chest pain in the past, presents with sharp midsternal chest pain and mildly elevated troponins.  HPI   Lori [name] is a 65 y.o. female who is being seen today for the evaluation of chest pain at the request of Dr. Tana Coast. This is a 65 year old female with history of coronary artery disease, COPD, tobacco use, GERD, depression, dyslipidemia and hiatal hernia in the past who presented with chest pain, weakness and shortness of breath that has been progressively worsening over the past several weeks.  The pain was described as sharp and 10 out of 10 intensity in the midsternal area.  She said she is also dizziness and almost passed out during these episodes.  She is reporting worsening fatigue and decreased appetite.  Initial labs indicated elevated troponins of 79 and 83, however the repeat lab was less than 2, suggesting there may be laboratory abnormalities at play.  We have confirmed a laboratory abnormality with troponins at [facility] long over the past 24 hours and the initial lab values will all be repeated due to this.  Her creatinine is noted to be elevated at 1.99.  Otherwise work-up has been unremarkable.  She underwent coronary CT angiography back in March 2018 after having previously had 3 Myoview stress test which were negative.  This demonstrated no coronary calcium and no evidence of coronary artery disease.  PMHx   Past Medical History:  Diagnosis Date   Acute MI ([place])    x3 - by report only. She has had 3 negative Myoview stress test with no evidence of prior infarct.   Allergic rhinitis    Allergy    Anemia    Ankle fracture 05/2016   Anxiety     Arthritis    Asthma    Barrett esophagus 2007   Chest pain 05-02-2009   echo  EF 55%   COPD (chronic obstructive pulmonary disease) with chronic bronchitis (HCC)    & Emphysema   Depression    Duodenitis without mention of hemorrhage 2007   Esophageal reflux 2007   Esophageal stricture    Full dentures    H/O hiatal hernia    Hiatal hernia 2006,2007   Hyperlipidemia    Multiple fractures    from falls, fx rt. elbow, fx left wrist, bilateral ankles   Pneumonia 10/2016   Restrictive lung disease 06/30/2017   Ulcer     Past Surgical History:  Procedure Laterality Date   ABDOMINAL HYSTERECTOMY     BSO   APPENDECTOMY     CESAREAN SECTION     x 2   CHOLECYSTECTOMY     CHONDROPLASTY Left 01/06/2015   Procedure: CHONDROPLASTY;  Surgeon: Marybelle Killings, MD;  Location: [facility];  Service: Orthopedics;  Laterality: Left;   COLONOSCOPY     ELBOW FRACTURE SURGERY     right   KNEE ARTHROSCOPY WITH EXCISION PLICA Left 8/33/8250   Procedure: KNEE ARTHROSCOPY WITH EXCISION PLICA;  Surgeon: Marybelle Killings, MD;  Location: [facility];  Service: Orthopedics;  Laterality: Left;   Lower Extremity Arterial Dopplers  07/06/2013   RABI 1.0, LABI 1.1.; No evidence of significant vascular atherogenic plaque   NECK SURGERY  NM MYOVIEW LTD  09/2014   LOW RISK. Normal EF of 65% with no regional wall motion abnormalities. No ischemia or infarction.   TONSILLECTOMY     WRIST FRACTURE SURGERY     left, has plate    FAMHx   Family History  Problem Relation Age of Onset   Emphysema Father    Dementia Mother    Diabetes Maternal Grandmother    Diabetes Maternal Aunt    Diabetes Other        mat. cousin   Heart disease Brother    Colon cancer Neg Hx    Allergic rhinitis Neg Hx    Angioedema Neg Hx    Asthma Neg Hx    Atopy Neg Hx    Eczema Neg Hx    Immunodeficiency Neg Hx    Urticaria Neg Hx     SOCHx    reports that she has been smoking cigarettes.  She started smoking about 43 years ago. She has a 19.50 pack-year smoking history. She has never used smokeless tobacco. She reports that she does not drink alcohol and does not use drugs.  Outpatient Medications   Current Facility-Administered Medications on File Prior to Encounter  Medication Dose Route Frequency Provider Last Rate Last Admin   Benralizumab SOSY 30 mg  30 mg Subcutaneous Q8 weeks Valentina Shaggy, MD   30 mg at 02/07/21 1941   Current Outpatient Medications on File Prior to Encounter  Medication Sig Dispense Refill   AIMOVIG 140 MG/ML SOAJ SMARTSIG:140 Milligram(s) SUB-Q Once a Month     ALPRAZolam (XANAX) 1 MG tablet TAKE 1 TABLET BY MOUTH AT BEDTIME AS NEEDED FOR ANXIETY 90 tablet 1   benzonatate (TESSALON) 200 MG capsule Take 1 capsule (200 mg total) by mouth 3 (three) times daily as needed for cough. 40 capsule 1   cetirizine (ZYRTEC) 10 MG tablet Take 1 tablet (10 mg total) by mouth daily. 31 tablet 5   cyclobenzaprine (FLEXERIL) 5 MG tablet Take 1 tablet by mouth three times daily as needed for muscle spasm 60 tablet 2   DEXILANT 60 MG capsule Take 1 capsule by mouth once daily 90 capsule 3   diphenoxylate-atropine (LOMOTIL) 2.5-0.025 MG tablet Take 1 tablet by mouth 4 (four) times daily as needed for diarrhea or loose stools. 30 tablet 0   EPINEPHrine 0.3 mg/0.3 mL IJ SOAJ injection INJECT AS DIRECTED FOR SEVERE ALLERGIC REACTION 2 each 1   FASENRA 30 MG/ML SOSY Inject into the skin.     fluticasone-salmeterol (ADVAIR HFA) 230-21 MCG/ACT inhaler Inhale 2 puffs into the lungs 2 (two) times daily. 12 g 5   gabapentin (NEURONTIN) 300 MG capsule Take by mouth.     guaiFENesin-dextromethorphan (ROBITUSSIN DM) 100-10 MG/5ML syrup Take 5 mLs by mouth every 4 (four) hours as needed for cough. 118 mL 1   levalbuterol (XOPENEX HFA) 45 MCG/ACT inhaler Inhale 2 puffs into the lungs as directed. 15 g 2   meclizine (ANTIVERT) 25 MG tablet Take 1 tablet (25 mg total) by mouth 3  (three) times daily as needed for dizziness. 40 tablet 2   mometasone-formoterol (DULERA) 200-5 MCG/ACT AERO Inhale 2 puffs into the lungs 2 (two) times daily. 1 each 5   montelukast (SINGULAIR) 10 MG tablet Take 1 tablet (10 mg total) by mouth at bedtime. 90 tablet 3   NARCAN 4 MG/0.1ML LIQD nasal spray kit      nitroGLYCERIN (NITROSTAT) 0.4 MG SL tablet DISSOLVE ONE TABLET UNDER THE TONGUE EVERY  5 MINUTES AS NEEDED FOR CHEST PAIN.  DO NOT EXCEED A TOTAL OF 3 DOSES IN 15 MINUTES (Patient taking differently: DISSOLVE ONE TABLET UNDER THE TONGUE EVERY 5 MINUTES AS NEEDED FOR CHEST PAIN.  DO NOT EXCEED A TOTAL OF 3 DOSES IN 15 MINUTES) 25 tablet 0   ondansetron (ZOFRAN) 4 MG tablet ondansetron HCl 4 mg tablet     pneumococcal 23 valent vaccine (PNEUMOVAX 23) 25 MCG/0.5ML injection Pneumovax-23 25 mcg/0.5 mL injection syringe  ADM 0.5ML IM UTD     predniSONE (DELTASONE) 10 MG tablet 3 tabs by mouth per day for 3 days,2tabs per day for 3 days,1tab per day for 3 days 18 tablet 0   rosuvastatin (CRESTOR) 40 MG tablet Take 1 tablet (40 mg total) by mouth daily. 90 tablet 3   triamcinolone (NASACORT) 55 MCG/ACT AERO nasal inhaler Place 2 sprays into the nose daily. 1 each 12   valACYclovir (VALTREX) 1000 MG tablet Take 1 tablet by mouth twice daily 180 tablet 0    Inpatient Medications    Scheduled Meds:  ALPRAZolam  1 mg Oral QHS   Benralizumab  30 mg Subcutaneous Q8 weeks   benzonatate  200 mg Oral TID   doxycycline  100 mg Oral Q12H   gabapentin  300 mg Oral BID   guaiFENesin  1,200 mg Oral BID   heparin  5,000 Units Subcutaneous Q8H   levalbuterol  2 puff Inhalation BID   loratadine  10 mg Oral Daily   mometasone-formoterol  2 puff Inhalation BID   montelukast  10 mg Oral QHS   pantoprazole  40 mg Oral Daily   rosuvastatin  40 mg Oral Daily   valACYclovir  1,000 mg Oral BID    Continuous Infusions:  lactated ringers 100 mL/hr at 03/28/21 0948    PRN Meds: cyclobenzaprine,  guaiFENesin-dextromethorphan, meclizine   ALLERGIES   Allergies  Allergen Reactions   Bee Venom Swelling   Codeine Nausea Only and Other (See Comments)    CAUSES ULCERS   Ibuprofen Nausea And Vomiting   Clonidine Derivatives Other (See Comments)    Unknown    Statins    Tramadol Nausea Only   Latex Rash   Levofloxacin Nausea Only   Propoxyphene N-Acetaminophen Nausea Only    ROS   Pertinent items noted in HPI and remainder of comprehensive ROS otherwise negative.  Vitals   Vitals:   03/28/21 0523 03/28/21 0600 03/28/21 0700 03/28/21 1030  BP: 115/69 (!) 111/96 132/66 (!) 166/66  Pulse: 83 74 67 65  Resp: '15 15 16 16  ' Temp: 98.5 F (36.9 C)     TempSrc: Oral     SpO2: 100% 96% 99% 98%  Weight: 56.7 kg     Height: '5\' 4"'  (1.626 m)      No intake or output data in the 24 hours ending 03/28/21 1317 Filed Weights   03/28/21 0523  Weight: 56.7 kg    Physical Exam   General appearance: alert and no distress Neck: no carotid bruit, no JVD, and thyroid not enlarged, symmetric, no tenderness/mass/nodules Lungs: clear to auscultation bilaterally Heart: regular rate and rhythm, S1, S2 normal, no murmur, click, rub or gallop Abdomen: soft, non-tender; bowel sounds normal; no masses,  no organomegaly Extremities: extremities normal, atraumatic, no cyanosis or edema Pulses: 2+ and symmetric Skin: Skin color, texture, turgor normal. No rashes or lesions Neurologic: Grossly normal Psych: Pleasant  Labs   Results for orders placed or performed during the hospital encounter of 03/28/21 (  from the past 48 hour(s))  Resp Panel by RT-PCR (Flu A&B, Covid) Nasopharyngeal Swab     Status: None   Collection Time: 03/28/21  5:33 AM   Specimen: Nasopharyngeal Swab; Nasopharyngeal(NP) swabs in vial transport medium  Result Value Ref Range   SARS Coronavirus 2 by RT PCR NEGATIVE NEGATIVE    Comment: (NOTE) SARS-CoV-2 target nucleic acids are NOT DETECTED.  The SARS-CoV-2 RNA is  generally detectable in upper respiratory specimens during the acute phase of infection. The lowest concentration of SARS-CoV-2 viral copies this assay can detect is 138 copies/mL. A negative result does not preclude SARS-Cov-2 infection and should not be used as the sole basis for treatment or other patient management decisions. A negative result may occur with  improper specimen collection/handling, submission of specimen other than nasopharyngeal swab, presence of viral mutation(s) within the areas targeted by this assay, and inadequate number of viral copies(<138 copies/mL). A negative result must be combined with clinical observations, patient history, and epidemiological information. The expected result is Negative.  Fact Sheet for Patients:  EntrepreneurPulse.com.au  Fact Sheet for Healthcare Providers:  IncredibleEmployment.be  This test is no t yet approved or cleared by the [place] FDA and  has been authorized for detection and/or diagnosis of SARS-CoV-2 by FDA under an Emergency Use Authorization (EUA). This EUA will remain  in effect (meaning this test can be used) for the duration of the COVID-19 declaration under Section 564(b)(1) of the Act, 21 U.S.C.section 360bbb-3(b)(1), unless the authorization is terminated  or revoked sooner.       Influenza A by PCR NEGATIVE NEGATIVE   Influenza B by PCR NEGATIVE NEGATIVE    Comment: (NOTE) The Xpert Xpress SARS-CoV-2/FLU/RSV plus assay is intended as an aid in the diagnosis of influenza from Nasopharyngeal swab specimens and should not be used as a sole basis for treatment. Nasal washings and aspirates are unacceptable for Xpert Xpress SARS-CoV-2/FLU/RSV testing.  Fact Sheet for Patients: EntrepreneurPulse.com.au  Fact Sheet for Healthcare Providers: IncredibleEmployment.be  This test is not yet approved or cleared by the [place] FDA  and has been authorized for detection and/or diagnosis of SARS-CoV-2 by FDA under an Emergency Use Authorization (EUA). This EUA will remain in effect (meaning this test can be used) for the duration of the COVID-19 declaration under Section 564(b)(1) of the Act, 21 U.S.C. section 360bbb-3(b)(1), unless the authorization is terminated or revoked.  Performed at [facility], [place] [address] [place], [place] [address]   Basic metabolic panel     Status: Abnormal   Collection Time: 03/28/21  5:42 AM  Result Value Ref Range   Sodium 137 135 - 145 mmol/L   Potassium 4.2 3.5 - 5.1 mmol/L   Chloride 106 98 - 111 mmol/L   CO2 22 22 - 32 mmol/L   Glucose, Bld 101 (H) 70 - 99 mg/dL    Comment: Glucose reference range applies only to samples taken after fasting for at least 8 hours.   BUN 16 8 - 23 mg/dL   Creatinine, Ser 1.99 (H) 0.44 - 1.00 mg/dL   Calcium 9.3 8.9 - 10.3 mg/dL   GFR, Estimated 28 (L) >60 mL/min    Comment: (NOTE) Calculated using the CKD-EPI Creatinine Equation (2021)    Anion gap 9 5 - 15    Comment: Performed at [facility], [place] [address] [place], [place] [address]  CBC with Differential/Platelet     Status: None   Collection Time: 03/28/21  5:42 AM  Result Value Ref Range   WBC 8.3 4.0 - 10.5 K/uL   RBC 4.33 3.87 - 5.11 MIL/uL   Hemoglobin 14.4 12.0 - 15.0 g/dL   HCT 41.2 36.0 - 46.0 %   MCV 95.2 80.0 - 100.0 fL   MCH 33.3 26.0 - 34.0 pg   MCHC 35.0 30.0 - 36.0 g/dL   RDW 13.2 11.5 - 15.5 %   Platelets 270 150 - 400 K/uL   nRBC 0.0 0.0 - 0.2 %   Neutrophils Relative % 63 %   Neutro Abs 5.2 1.7 - 7.7 K/uL   Lymphocytes Relative 30 %   Lymphs Abs 2.5 0.7 - 4.0 K/uL   Monocytes Relative 6 %   Monocytes Absolute 0.5 0.1 - 1.0 K/uL   Eosinophils Relative 0 %   Eosinophils Absolute 0.0 0.0 - 0.5 K/uL   Basophils Relative 0 %   Basophils Absolute 0.0 0.0 - 0.1 K/uL   Immature Granulocytes 1 %   Abs Immature Granulocytes  0.06 0.00 - 0.07 K/uL    Comment: Performed at [facility], [place] [address] [place], [place] [address]  Troponin I (High Sensitivity)     Status: Abnormal   Collection Time: 03/28/21  5:42 AM  Result Value Ref Range   Troponin I (High Sensitivity) 79 (H) <18 ng/L    Comment: (NOTE) Elevated high sensitivity troponin I (hsTnI) values and significant  changes across serial measurements may suggest ACS but many other  chronic and acute conditions are known to elevate hsTnI results.  Refer to the "Links" section for chest pain algorithms and additional  guidance. Performed at [facility], [place] [address], [place], [place] [address]   Ethanol     Status: None   Collection Time: 03/28/21  6:13 AM  Result Value Ref Range   Alcohol, Ethyl (B) <10 <10 mg/dL    Comment: (NOTE) Lowest detectable limit for serum alcohol is 10 mg/dL.  For medical purposes only. Performed at [facility], [place] [address] [place], [place] [address]   Urine rapid drug screen (hosp performed)     Status: None   Collection Time: 03/28/21  6:13 AM  Result Value Ref Range   Opiates NONE DETECTED NONE DETECTED   Cocaine NONE DETECTED NONE DETECTED   Benzodiazepines NONE DETECTED NONE DETECTED   Amphetamines NONE DETECTED NONE DETECTED   Tetrahydrocannabinol NONE DETECTED NONE DETECTED   Barbiturates NONE DETECTED NONE DETECTED    Comment: (NOTE) DRUG SCREEN FOR MEDICAL PURPOSES ONLY.  IF CONFIRMATION IS NEEDED FOR ANY PURPOSE, NOTIFY LAB WITHIN 5 DAYS.  LOWEST DETECTABLE LIMITS FOR URINE DRUG SCREEN Drug Class                     Cutoff (ng/mL) Amphetamine and metabolites    1000 Barbiturate and metabolites    200 Benzodiazepine                 250 Tricyclics and metabolites     300 Opiates and metabolites        300 Cocaine and metabolites        300 THC                            50 Performed at [facility], [place] [address] [place], [place] [address]   Urinalysis, Routine w reflex microscopic     Status: Abnormal  Collection Time: 03/28/21  6:13 AM  Result Value Ref Range   Color, Urine YELLOW YELLOW   APPearance HAZY (A) CLEAR   Specific Gravity, Urine 1.005 1.005 - 1.030   pH 5.0 5.0 - 8.0   Glucose, UA NEGATIVE NEGATIVE mg/dL   Hgb urine dipstick SMALL (A) NEGATIVE   Bilirubin Urine NEGATIVE NEGATIVE   Ketones, ur NEGATIVE NEGATIVE mg/dL   Protein, ur NEGATIVE NEGATIVE mg/dL   Nitrite NEGATIVE NEGATIVE   Leukocytes,Ua NEGATIVE NEGATIVE   RBC / HPF 0-5 0 - 5 RBC/hpf   WBC, UA 0-5 0 - 5 WBC/hpf   Bacteria, UA RARE (A) NONE SEEN   Squamous Epithelial / LPF 0-5 0 - 5   Mucus PRESENT     Comment: Performed at [facility], [place] [address] [place], [place] [address]  Troponin I (High Sensitivity)     Status: Abnormal   Collection Time: 03/28/21  7:57 AM  Result Value Ref Range   Troponin I (High Sensitivity) 83 (H) <18 ng/L    Comment: (NOTE) Elevated high sensitivity troponin I (hsTnI) values and significant  changes across serial measurements may suggest ACS but many other  chronic and acute conditions are known to elevate hsTnI results.  Refer to the "Links" section for chest pain algorithms and additional  guidance. Performed at [facility], [place] [address], [place], [place] [address]   Troponin I (High Sensitivity)     Status: None   Collection Time: 03/28/21 10:06 AM  Result Value Ref Range   Troponin I (High Sensitivity) <2 <18 ng/L    Comment: DELTA CHECK NOTED (NOTE) Elevated high sensitivity troponin I (hsTnI) values and significant  changes across serial measurements may suggest ACS but many other  chronic and acute conditions are known to elevate hsTnI results.  Refer to the Links section for chest pain algorithms and additional  guidance. Performed at [facility], [place] [address] [place], [place] [address]     ECG    N/A  Telemetry   Sinus rhythm - Personally Reviewed  Radiology   CT HEAD WO CONTRAST  Result Date: 03/28/2021 CLINICAL DATA:  65 year old female with dizziness. Increasing weakness. EXAM: CT HEAD WITHOUT CONTRAST TECHNIQUE: Contiguous axial images were obtained from the base of the skull through the vertex without intravenous contrast. COMPARISON:  Brain MRI 10/21/2018.  Head CT 04/07/2015. FINDINGS: Brain: Mild generalized cerebral volume loss since 2016. No midline shift, ventriculomegaly, mass effect, evidence of mass lesion, intracranial hemorrhage or evidence of cortically based acute infarction. No cortical encephalomalacia identified. Minimal to mild for age scattered white matter hypodensity, mostly subcortical. Vascular: No suspicious intracranial vascular hyperdensity. Skull: Stable, negative. Sinuses/Orbits: Opacified posterior left ethmoid air cell is new since 2016, and there was mild mucosal thickening there in 2020. Other paranasal sinuses and mastoids are clear. Tympanic cavities are clear. Other: Postoperative changes to both globes. No acute orbit or scalp soft tissue finding. IMPRESSION: 1. No acute intracranial abnormality. Mild for age nonspecific cerebral white matter changes, most commonly due to chronic small vessel disease. 2. Isolated posterior left ethmoid sinus disease. Electronically Signed   By: Genevie Ann M.D.   On: 03/28/2021 06:43   US RENAL  Result Date: 03/28/2021 CLINICAL DATA:  Acute kidney injury EXAM: RENAL / URINARY TRACT ULTRASOUND COMPLETE COMPARISON:  CT 11/14/2017 FINDINGS: Right Kidney: Renal measurements: 9.4 x 3.7 x 4.7 cm = volume: 85 mL. Mild cortical volume loss. Echogenicity within normal limits. No mass  or hydronephrosis visualized. Left Kidney: Renal measurements: 10.1 x 4.8 x 4.1 cm = volume: 104 mL. Mild cortical volume loss. Echogenicity within normal limits. No mass or hydronephrosis visualized. Bladder: Appears normal for degree of bladder  distention. Other: None. IMPRESSION: No acute finding. No hydronephrosis or focal lesion. Mild bilateral cortical volume loss. Electronically Signed   By: Nelson Chimes M.D.   On: 03/28/2021 10:01   DG Chest Port 1 View  Result Date: 03/28/2021 CLINICAL DATA:  65 year old female with history of chest pain and shortness of breath for the past 3 weeks. History of COPD. EXAM: PORTABLE CHEST 1 VIEW COMPARISON:  Chest x-ray 01/12/2021. FINDINGS: Lung volumes are slightly low. No consolidative airspace disease. Small left pleural effusion. No right pleural effusion. No pneumothorax. No suspicious appearing pulmonary nodules or masses are noted. No evidence of pulmonary edema. Heart size is normal. Atherosclerotic calcifications in the thoracic aorta. IMPRESSION: 1. Small left pleural effusion. 2. Aortic atherosclerosis. Electronically Signed   By: Vinnie Langton M.D.   On: 03/28/2021 06:09    Cardiac Studies   none  Impression   Principal Problem:   Chest pain Active Problems:   Hyperlipidemia LDL goal <100   Cigarette smoker   Borderline hypertension   Anxiety state   Gait difficulty   Cough   Dizziness   Asthma-COPD overlap syndrome (HCC)   AKI (acute kidney injury) ([place])   Recommendation   This is a 65 year old female with weakness, anxiety, sharp substernal chest pain, history of pain in the chest related to COPD with negative stress testing numerous times and a CT coronary angiogram which demonstrated no coronary calcium and no evidence of coronary disease in 2018.  Initially her troponin was mildly elevated however third test came back undetectable.  This is an unusual drop and we did identify that there have been difficulties with the troponin lab machine causing erroneous values over the past 24 hours.  Based on this the troponin labs will be retested however I suspect that the 2 initial labs may have been falsely abnormal.  This testing could take more than 24 hours.  Given the fact  that there was no evidence of any coronary disease by CT coronary angiogram in 2018, its unlikely that she is progressed to develop any disease at this point. An echocardiogram has been ordered and is in process. If this does not show any significant abnormalities, would recommend outpatient follow-up with Dr. Ellyn Hack .  I suspect her weakness and presyncope are related to issues with dehydration and mild acute kidney injury.  Would not recommend any further cardiac work-up as an inpatient.  Thanks for the consultation. Will sign-off. Follow-up with Dr. Ellyn Hack.  Time Spent Directly with Patient:  I have spent a total of 45 minutes with the patient reviewing hospital notes, telemetry, EKGs, labs and examining the patient as well as establishing an assessment and plan that was discussed personally with the patient.  > 50% of time was spent in direct patient care.  Length of Stay:  LOS: 0 days   Pixie Casino, MD, [facility], [place] Director of the Advanced Lipid Disorders &  Cardiovascular Risk Reduction Clinic Diplomate of the American Board of Clinical Lipidology Attending Cardiologist  Direct Dial: [phone]  Fax: [phone]  Website:  [email or web address] [email or web address] Tiffini Blacksher 03/28/2021, 1:17 PM

## 2021-03-28 NOTE — Evaluation (Signed)
Physical Therapy Evaluation Patient Details Name: Lori [name] MRN: [id number] DOB: 1955-10-30 Today's Date: 03/28/2021   History of Present Illness  Patient is 65 y.o. female presented to [facility] with c/o chest pain, weakness, dizziness. PMH significant for CAD, COPD, GERD, anxiety, depression, hiatal hernia, HLD, neck surgery.    Clinical Impression  Lori [name] is 65 y.o. female admitted with above HPI and diagnosis. Patient is currently limited by functional impairments below (see PT problem list). Patient lives alone and is independent at baseline. Currently she requires min guard/assist for bed mobility and sit<>stands, unable to advance gait today due to bil LE's buckling. Vestibular assessment completed as well and HINTS testing negative and oculomotor assessment WNL's. Limited testing performed for head impulse due to history of neck pain and cerical spine surgery. Patient will benefit from continued skilled PT interventions to address impairments and progress independence with mobility, recommending SNF placement as pt is unsafe to return home alone. If pt declines will require HHPT and HH aid/services. Acute PT will follow and progress as able.     Follow Up Recommendations SNF    Equipment Recommendations  Rolling walker with 5" wheels    Recommendations for Other Services       Precautions / Restrictions Precautions Precautions: Fall Precaution Comments: denies falls in last 6 months Restrictions Weight Bearing Restrictions: No       Vestibular Assessment - 03/28/21 0001       Vestibular Assessment   General Observation no acute s/s of distress. no nystagmus noted at rest.      Symptom Behavior   Subjective history of current problem vertigo statred 2 years ago, always comes on with motion. it comes and goes and pt reports she has used meclezine to help with it in the past.    Type of Dizziness  Vertigo;Spinning;"World moves"    Frequency of Dizziness constant  currently, comes and goes over the last couple years.    Symptom Nature Motion provoked    Aggravating Factors Activity in general    Relieving Factors Slow movements    Progression of Symptoms No change since onset      Oculomotor Exam   Oculomotor Alignment Normal    Ocular ROM WNLs    Spontaneous Absent    Gaze-induced  Absent    Head shaking Horizontal Comment   pt unable to tolerate head impulse due to neck pain and limited cervical ROM   Head Shaking Vertical Comment   pt unable to tolerate head impulse due to neck pain and limited cervical ROM   Smooth Pursuits Intact    Saccades Intact    Comment HINTS: impulse, skew normal              Mobility  Bed Mobility Overal bed mobility: Needs Assistance Bed Mobility: Supine to Sit;Sit to Supine     Supine to sit: Min guard;HOB elevated Sit to supine: Min guard   General bed mobility comments: guarding for safety, pt able to bring bil LE's off EOB, raise trunk, and scoot to EOB.    Transfers Overall transfer level: Needs assistance Equipment used: 1 person hand held assist Transfers: Sit to/from Stand Sit to Stand: Min assist;From elevated surface         General transfer comment: HHA for stability, min assist to steady with power up and rise. pt took small side step at EOB however LE's buckling due to weakness and returned to sitting.  Ambulation/Gait  Stairs            Wheelchair Mobility    Modified Rankin (Stroke Patients Only)       Balance Overall balance assessment: Needs assistance Sitting-balance support: Bilateral upper extremity supported;Feet unsupported Sitting balance-Leahy Scale: Good     Standing balance support: During functional activity;Single extremity supported Standing balance-Leahy Scale: Poor Standing balance comment: reliant on support from therapist                             Pertinent Vitals/Pain Pain Assessment: No/denies pain     Home Living Family/patient expects to be discharged to:: Private residence Living Arrangements: Alone Available Help at Discharge: Friend(s) Type of Home: Mobile home Home Access: Stairs to enter Entrance Stairs-Rails: Right;Left;Can reach both Entrance Stairs-Number of Steps: 4-5 Home Layout: One level Home Equipment: None      Prior Function Level of Independence: Independent         Comments: pt usually gets transportation from friends, doesn't drive. pt does her own cooking/cleaning. reports she furniture surfs when mobilizing in home.     Hand Dominance   Dominant Hand: Right    Extremity/Trunk Assessment   Upper Extremity Assessment Upper Extremity Assessment: Generalized weakness    Lower Extremity Assessment Lower Extremity Assessment: Generalized weakness (Rt/Lt knee ext/flex 4-/5 and dorsiflexion 4/-5 with plantar flexion 3-/5 (Rt<Lt))    Cervical / Trunk Assessment Cervical / Trunk Assessment: Kyphotic;Other exceptions Cervical / Trunk Exceptions: limited cervical ROM and pt reports pain with cervical rotation and flex/ext  Communication   Communication: No difficulties  Cognition Arousal/Alertness: Awake/alert Behavior During Therapy: WFL for tasks assessed/performed Overall Cognitive Status: Within Functional Limits for tasks assessed                                        General Comments      Exercises     Assessment/Plan    PT Assessment Patient needs continued PT services  PT Problem List Decreased strength;Decreased range of motion;Decreased activity tolerance;Decreased balance;Decreased mobility;Decreased knowledge of use of DME;Decreased knowledge of precautions;Pain       PT Treatment Interventions DME instruction;Gait training;Stair training;Functional mobility training;Therapeutic activities;Therapeutic exercise;Balance training;Patient/family education    PT Goals (Current goals can be found in the Care Plan  section)  Acute Rehab PT Goals Patient Stated Goal: get stronger and stop being dizzy PT Goal Formulation: With patient Time For Goal Achievement: 04/11/21 Potential to Achieve Goals: Good    Frequency 7X/week   Barriers to discharge        Co-evaluation               AM-PAC PT "6 Clicks" Mobility  Outcome Measure Help needed turning from your back to your side while in a flat bed without using bedrails?: A Little Help needed moving from lying on your back to sitting on the side of a flat bed without using bedrails?: A Little Help needed moving to and from a bed to a chair (including a wheelchair)?: A Little Help needed standing up from a chair using your arms (e.g., wheelchair or bedside chair)?: A Little Help needed to walk in hospital room?: A Lot Help needed climbing 3-5 steps with a railing? : A Lot 6 Click Score: 16    End of Session Equipment Utilized During Treatment: Gait belt Activity Tolerance: Patient tolerated treatment  well Patient left: in bed;with call bell/phone within reach Nurse Communication: Mobility status PT Visit Diagnosis: Muscle weakness (generalized) (M62.81)    Time: 0037-9444 PT Time Calculation (min) (ACUTE ONLY): 21 min   Charges:   PT Evaluation $PT Eval Moderate Complexity: 1 Mod          Verner Mould, DPT Acute Rehabilitation Services Office [phone] Pager [phone]   Jacques Navy 03/28/2021, 6:24 PM

## 2021-03-28 NOTE — ED Provider Notes (Signed)
[facility] DEPT Provider Note   CSN: [id number] Arrival date & time: 03/28/21  4098     History Chief Complaint - weakness and chest pain  LYZETTE REINHARDT is a 65 y.o. female.  The history is provided by the patient.  Chest Pain Pain location:  Substernal area Pain quality: tightness   Pain radiates to:  Does not radiate Pain severity:  Moderate Onset quality:  Gradual Duration:  3 weeks Timing:  Constant Progression:  Unchanged Chronicity:  New Relieved by:  Nothing Worsened by:  Nothing Associated symptoms: cough and shortness of breath   Associated symptoms: no abdominal pain, no fever and no syncope   Patient reports chest pain and weakness for the past 3 weeks.  She reports she has had ongoing chest pain and shortness of breath for up to 3 weeks.  She also reports generalized weakness and difficulty walking.  She reports feeling very lightheaded.  She reports she has fallen but not sustained any injuries.  She reports that she feels like she might pass out  no new medications.  She had some diarrhea but that is improved.  No vomiting.  No black or bloody stools.    Past Medical History:  Diagnosis Date   Acute MI ([place])    x3 - by report only. She has had 3 negative Myoview stress test with no evidence of prior infarct.   Allergic rhinitis    Allergy    Anemia    Ankle fracture 05/2016   Anxiety    Arthritis    Asthma    Barrett esophagus 2007   Chest pain 05-02-2009   echo  EF 55%   COPD (chronic obstructive pulmonary disease) with chronic bronchitis (HCC)    & Emphysema   Depression    Duodenitis without mention of hemorrhage 2007   Esophageal reflux 2007   Esophageal stricture    Full dentures    H/O hiatal hernia    Hiatal hernia 2006,2007   Hyperlipidemia    Multiple fractures    from falls, fx rt. elbow, fx left wrist, bilateral ankles   Pneumonia 10/2016   Restrictive lung disease 06/30/2017   Ulcer     Patient  Active Problem List   Diagnosis Date Noted   Allergic rhinitis 01/08/2021   Abnormal urine odor 11/14/2020   Aortic atherosclerosis ([place]) 11/14/2020   Severe persistent asthma without complication 11/91/4782   Hymenoptera allergy 11/02/2020   Genital herpes 11/12/2019   Coccyx pain 11/12/2019   Insomnia 11/12/2019   Acute right-sided low back pain without sciatica 07/20/2019   Grief 07/20/2019   Vitamin D deficiency 07/20/2019   Hyperglycemia 04/21/2019   Left knee pain 04/21/2019   Pain 12/29/2018   B12 deficiency 10/20/2018   Bilateral hand pain 08/03/2018   Nausea 04/27/2018   Anemia, iron deficiency 04/27/2018   Intermittent paresthesia of hand and foot 04/27/2018   Headache 03/21/2018   Lumbar radiculitis 08/15/2017   Degenerative disc disease, lumbar 07/21/2017   Encounter for well adult exam with abnormal findings 06/30/2017   Epigastric pain 06/30/2017   Restrictive lung disease 06/30/2017   Leg cramping 06/09/2017   Elbow contusion 06/09/2017   Right elbow pain 05/27/2017   Asthma-COPD overlap syndrome ([place]) 01/31/2017   Dizziness 01/20/2017   Seasonal and perennial allergic rhinitis 01/01/2017   Moderate persistent asthma, uncomplicated 95/62/1308   Pulmonary emphysema ([place]) 12/03/2016   Tobacco abuse 12/03/2016   Non-seasonal allergic rhinitis due to fungal spores 12/03/2016  Medication management 11/29/2016   Cough 11/23/2016   Rash 11/23/2016   Vertigo 10/22/2016   Gastroesophageal reflux disease with esophagitis 10/22/2016   Non compliance w medication regimen 08/01/2016   Allergic contact dermatitis due to metals 08/01/2016   Migraine without aura and without status migrainosus, not intractable 07/22/2016   Spasm of muscle of lower back 06/21/2016   Chronic rhinitis 06/19/2016   Compliance poor 06/19/2016   Gait difficulty 06/21/2015   Fibromyalgia 06/21/2015   Conversion reaction 06/21/2015   Depression 06/21/2015   Syncope and collapse 30/16/0109    Plica syndrome of left knee 01/06/2015   Anxiety state 09/06/2014   History of MI 09/06/2014   Cigarette smoker 06/27/2013   Borderline hypertension 06/27/2013   Chronic low back pain with sciatica 06/14/2013   Leg pain, bilateral 06/14/2013   Gastroparesis 10/26/2009   Musculoskeletal chest pain 05/02/2009   Hyperlipidemia LDL goal <100 11/18/2008   COPD exacerbation ([place]) 11/18/2008   HEPATIC CYST 11/18/2008    Past Surgical History:  Procedure Laterality Date   ABDOMINAL HYSTERECTOMY     BSO   APPENDECTOMY     CESAREAN SECTION     x 2   CHOLECYSTECTOMY     CHONDROPLASTY Left 01/06/2015   Procedure: CHONDROPLASTY;  Surgeon: Marybelle Killings, MD;  Location: [facility];  Service: Orthopedics;  Laterality: Left;   COLONOSCOPY     ELBOW FRACTURE SURGERY     right   KNEE ARTHROSCOPY WITH EXCISION PLICA Left 12/07/5571   Procedure: KNEE ARTHROSCOPY WITH EXCISION PLICA;  Surgeon: Marybelle Killings, MD;  Location: [facility];  Service: Orthopedics;  Laterality: Left;   Lower Extremity Arterial Dopplers  07/06/2013   RABI 1.0, LABI 1.1.; No evidence of significant vascular atherogenic plaque   NECK SURGERY     NM MYOVIEW LTD  09/2014   LOW RISK. Normal EF of 65% with no regional wall motion abnormalities. No ischemia or infarction.   TONSILLECTOMY     WRIST FRACTURE SURGERY     left, has plate     OB History   No obstetric history on file.     Family History  Problem Relation Age of Onset   Emphysema Father    Dementia Mother    Diabetes Maternal Grandmother    Diabetes Maternal Aunt    Diabetes Other        mat. cousin   Heart disease Brother    Colon cancer Neg Hx    Allergic rhinitis Neg Hx    Angioedema Neg Hx    Asthma Neg Hx    Atopy Neg Hx    Eczema Neg Hx    Immunodeficiency Neg Hx    Urticaria Neg Hx     Social History   Tobacco Use   Smoking status: Every Day    Packs/day: 0.50    Years: 39.00    Pack years: 19.50     Types: Cigarettes    Start date: 08/06/1977   Smokeless tobacco: Never  Vaping Use   Vaping Use: Never used  Substance Use Topics   Alcohol use: No    Alcohol/week: 0.0 standard drinks   Drug use: No    Home Medications Prior to Admission medications   Medication Sig Start Date End Date Taking? Authorizing Provider  AIMOVIG 140 MG/ML SOAJ SMARTSIG:140 Milligram(s) SUB-Q Once a Month 01/09/21   [provider]  ALPRAZolam (XANAX) 1 MG tablet TAKE 1 TABLET BY MOUTH AT BEDTIME AS NEEDED FOR  ANXIETY 02/06/21   Biagio Borg, MD  amoxicillin-clavulanate (AUGMENTIN) 875-125 MG tablet Take 1 tablet by mouth 2 (two) times daily. 01/12/21   Biagio Borg, MD  benzonatate (TESSALON) 200 MG capsule Take 1 capsule (200 mg total) by mouth 3 (three) times daily as needed for cough. 01/12/21   Biagio Borg, MD  cetirizine (ZYRTEC) 10 MG tablet Take 1 tablet (10 mg total) by mouth daily. 11/02/20   Dara Hoyer, FNP  cyclobenzaprine (FLEXERIL) 5 MG tablet Take 1 tablet by mouth three times daily as needed for muscle spasm 10/21/20   Biagio Borg, MD  DEXILANT 60 MG capsule Take 1 capsule by mouth once daily 10/21/20   Biagio Borg, MD  diphenoxylate-atropine (LOMOTIL) 2.5-0.025 MG tablet Take 1 tablet by mouth 4 (four) times daily as needed for diarrhea or loose stools. 10/10/20   Biagio Borg, MD  EPINEPHrine 0.3 mg/0.3 mL IJ SOAJ injection INJECT AS DIRECTED FOR SEVERE ALLERGIC REACTION 11/02/20   Dara Hoyer, FNP  fluticasone-salmeterol (ADVAIR HFA) 230-21 MCG/ACT inhaler Inhale 2 puffs into the lungs 2 (two) times daily. 01/01/21   Dara Hoyer, FNP  gabapentin (NEURONTIN) 300 MG capsule Take by mouth. 07/13/20   [provider]  guaiFENesin-dextromethorphan (ROBITUSSIN DM) 100-10 MG/5ML syrup Take 5 mLs by mouth every 4 (four) hours as needed for cough. 10/11/20   Biagio Borg, MD  levalbuterol [facility] HFA) 45 MCG/ACT inhaler Inhale 2 puffs into the lungs as directed. 11/02/20   Dara Hoyer, FNP  meclizine (ANTIVERT) 25 MG tablet Take 1 tablet (25 mg total) by mouth 3 (three) times daily as needed for dizziness. 01/02/21   Biagio Borg, MD  mometasone-formoterol [facility]) 200-5 MCG/ACT AERO Inhale 2 puffs into the lungs 2 (two) times daily. 11/02/20   Dara Hoyer, FNP  montelukast (SINGULAIR) 10 MG tablet Take 1 tablet (10 mg total) by mouth at bedtime. 11/02/20   Dara Hoyer, FNP  NARCAN 4 MG/0.1ML LIQD nasal spray kit  11/12/19   [provider]  nitroGLYCERIN (NITROSTAT) 0.4 MG SL tablet DISSOLVE ONE TABLET UNDER THE TONGUE EVERY 5 MINUTES AS NEEDED FOR CHEST PAIN.  DO NOT EXCEED A TOTAL OF 3 DOSES IN 15 MINUTES Patient taking differently: DISSOLVE ONE TABLET UNDER THE TONGUE EVERY 5 MINUTES AS NEEDED FOR CHEST PAIN.  DO NOT EXCEED A TOTAL OF 3 DOSES IN 15 MINUTES 04/28/20   Leonie Man, MD  ondansetron (ZOFRAN) 4 MG tablet ondansetron HCl 4 mg tablet    [provider]  pneumococcal 23 valent vaccine (PNEUMOVAX 23) 25 MCG/0.5ML injection Pneumovax-23 25 mcg/0.5 mL injection syringe  ADM 0.5ML IM UTD    [provider]  predniSONE (DELTASONE) 10 MG tablet 3 tabs by mouth per day for 3 days,2tabs per day for 3 days,1tab per day for 3 days 01/12/21   Biagio Borg, MD  rosuvastatin (CRESTOR) 40 MG tablet Take 1 tablet (40 mg total) by mouth daily. 11/15/20   Biagio Borg, MD  triamcinolone (NASACORT) 55 MCG/ACT AERO nasal inhaler Place 2 sprays into the nose daily. 01/02/21   Biagio Borg, MD  valACYclovir (VALTREX) 1000 MG tablet Take 1 tablet by mouth twice daily 02/24/21   Biagio Borg, MD    Allergies    Bee venom, Codeine, Ibuprofen, Clonidine derivatives, Statins, Tramadol, Latex, Levofloxacin, and Propoxyphene n-acetaminophen  Review of Systems   Review of Systems  Constitutional:  Negative for fever.  Eyes:  Negative for visual disturbance.       Denies new visual change  Respiratory:  Positive for cough, shortness of breath and wheezing.    Cardiovascular:  Positive for chest pain. Negative for syncope.  Gastrointestinal:  Negative for abdominal pain.  Neurological:  Positive for light-headedness. Negative for syncope.       Denies severe headache  All other systems reviewed and are negative.  Physical Exam Updated Vital Signs BP 115/69 (BP Location: Left Arm)   Pulse 83   Temp 98.5 F (36.9 C) (Oral)   Resp 15   Ht 1.626 m (5' 4")   Wt 56.7 kg   SpO2 100%   BMI 21.46 kg/m   Physical Exam CONSTITUTIONAL: Elderly, no acute distress HEAD: Normocephalic/atraumatic EYES: EOMI/PERRL, no nystagmus ENMT: Mucous membranes moist NECK: supple no meningeal signs CV: S1/S2 noted, no murmurs/rubs/gallops noted LUNGS: Wheezing bilaterally, no acute distress ABDOMEN: soft, nontender, no rebound or guarding GU:no cva tenderness NEURO:Awake/alert, face symmetric, no arm or leg drift is noted Equal 5/5 strength with shoulder abduction, elbow flex/extension, wrist flex/extension in upper extremities and equal hand grips bilaterally Patient reports she is unable to move her legs and was able to move her feet.  On passive leg raise, patient is able to resist gravity in both legs independently Cranial nerves 3/4/5/6/03/24/09/11/12 tested  No past pointing Sensation to light touch intact in all extremities EXTREMITIES: pulses normal, full ROM, distal pulses equal and intact SKIN: warm, color normal PSYCH: no abnormalities of mood noted  ED Results / Procedures / Treatments   Labs (all labs ordered are listed, but only abnormal results are displayed) Labs Reviewed  BASIC METABOLIC PANEL - Abnormal; Notable for the following components:      Result Value   Glucose, Bld 101 (*)    Creatinine, Ser 1.99 (*)    GFR, Estimated 28 (*)    All other components within normal limits  URINALYSIS, ROUTINE W REFLEX MICROSCOPIC - Abnormal; Notable for the following components:   APPearance HAZY (*)    Hgb urine dipstick SMALL (*)     Bacteria, UA RARE (*)    All other components within normal limits  TROPONIN I (HIGH SENSITIVITY) - Abnormal; Notable for the following components:   Troponin I (High Sensitivity) 79 (*)    All other components within normal limits  RESP PANEL BY RT-PCR (FLU A&B, COVID) ARPGX2  CBC WITH DIFFERENTIAL/PLATELET  ETHANOL  RAPID URINE DRUG SCREEN, HOSP PERFORMED  TROPONIN I (HIGH SENSITIVITY)    EKG ED ECG REPORT   Date: 03/28/2021 0522am  Rate: 85  Rhythm: normal sinus rhythm  QRS Axis: normal  Intervals: normal  ST/T Wave abnormalities: normal  Conduction Disutrbances:none  Narrative Interpretation:    I have personally reviewed the EKG tracing and agree with the computerized printout as noted.   Radiology DG Chest Port 1 View  Result Date: 03/28/2021 CLINICAL DATA:  65 year old female with history of chest pain and shortness of breath for the past 3 weeks. History of COPD. EXAM: PORTABLE CHEST 1 VIEW COMPARISON:  Chest x-ray 01/12/2021. FINDINGS: Lung volumes are slightly low. No consolidative airspace disease. Small left pleural effusion. No right pleural effusion. No pneumothorax. No suspicious appearing pulmonary nodules or masses are noted. No evidence of pulmonary edema. Heart size is normal. Atherosclerotic calcifications in the thoracic aorta. IMPRESSION: 1. Small left pleural effusion. 2. Aortic atherosclerosis. Electronically Signed   By: Vinnie Langton M.D.   On: 03/28/2021 06:09  Procedures Procedures   Medications Ordered in ED Medications - No data to display  ED Course  I have reviewed the triage vital signs and the nursing notes.  Pertinent labs & imaging results that were available during my care of the patient were reviewed by me and considered in my medical decision making (see chart for details).    MDM Rules/Calculators/A&P                          6:27 AM Patient presents with continuous chest pain and Generalized weakness for up to 3 weeks.   Patient reports she is now to the point where she has difficulty walking due to fatigue.  She is concerned she might have  a syncopal episode.  Labs and imaging pending at this time.  Will obtain CT head due to patient's vague weakness symptoms. 7:27 AM CT head negative.  However patient does appear to have acute kidney injury and mild troponin elevation.  However chest pain has reportedly been ongoing for 3 weeks.  No acute EKG changes. Plan for hospitalist admission 7:31 AM D/w dr Tana Coast for admission to the hospital service Final Clinical Impression(s) / ED Diagnoses Final diagnoses:  Near syncope  AKI (acute kidney injury) [facility])    Rx / [place] Orders ED Discharge Orders     None        Ripley Fraise, MD 03/28/21 [phone]

## 2021-03-28 NOTE — H&P (Addendum)
History and Physical        Hospital Admission Note Date: 03/28/2021  Patient name: Lori [name] Medical record number: [id number] Date of birth: 1955/12/10 Age: 65 y.o. Gender: female  PCP: Biagio Borg, MD    Patient coming from: Home  I have reviewed all records in the [facility].    Chief Complaint:  Chest pain with dizziness, weakness  HPI: Patient is a 65 year old female with with history of CAD, COPD, nicotine abuse with active smoking, GERD depression, hiatal hernia, hyperlipidemia presented to ED with chest pain with shortness of breath and dizziness in the last 3 weeks, progressively worsening.  Patient described the chest pain is midsternal, 10/10, sharp and intermittent, worse with exertion.  Patient states that she has been very dizzy and almost passed out x2 last night hence she presented to ED.  She feels generalized weakness, lightheadedness, poor appetite and fatigue for last 3 weeks.  Patient reports that she has a lot of stress going on with the recent passing of her husband and two brothers.  Patient ports that she has been feeling so weak that she is having difficulty ambulating. Has cough, however not able to bring anything up, no fevers or chills.  Has chronic bronchitis.   No nausea vomiting or diarrhea, hematochezia or melena. Lives alone at home.   ED work-up/course:  In ED temp 98.5, respiratory rate 16, heart rate 59, BP 132/66, O2 sats 99% on room air Orthostatic negative  Troponin 79-> 83.  BMET showed creatinine 1.99, BUN 16, baseline creatinine 0.9 on 11/14/2020. CBC unremarkable  Review of Systems: Positives marked in 'bold' Constitutional: Denies fever, chills, diaphoresis, poor appetite and fatigue.  HEENT: Denies photophobia, eye pain, redness, hearing loss, ear pain, sore throat, rhinorrhea, sneezing, mouth sores, trouble  swallowing, neck pain, neck stiffness and tinnitus.   Respiratory: Please see HPI Cardiovascular: Please see HPI Gastrointestinal: Denies nausea, vomiting, abdominal pain, diarrhea, constipation, blood in stool and abdominal distention.  Genitourinary: Denies dysuria, urgency, frequency, hematuria, flank pain and difficulty urinating.  Musculoskeletal: Denies myalgias, back pain, joint swelling, arthralgias. Skin: Denies pallor, rash and wound.  Neurological: Has history of vertigo, currently feeling more dizzy and lightheaded, difficulty ambulating, generalized weakness Hematological: Denies adenopathy. Easy bruising, personal or family bleeding history  Psychiatric/Behavioral: Denies suicidal ideation, mood changes, confusion, nervousness, sleep disturbance and agitation  Past Medical History: Past Medical History:  Diagnosis Date   Acute MI ([place])    x3 - by report only. She has had 3 negative Myoview stress test with no evidence of prior infarct.   Allergic rhinitis    Allergy    Anemia    Ankle fracture 05/2016   Anxiety    Arthritis    Asthma    Barrett esophagus 2007   Chest pain 05-02-2009   echo  EF 55%   COPD (chronic obstructive pulmonary disease) with chronic bronchitis (HCC)    & Emphysema   Depression    Duodenitis without mention of hemorrhage 2007   Esophageal reflux 2007   Esophageal stricture    Full dentures    H/O hiatal hernia    Hiatal hernia [id number]  Hyperlipidemia    Multiple fractures    from falls, fx rt. elbow, fx left wrist, bilateral ankles   Pneumonia 10/2016   Restrictive lung disease 06/30/2017   Ulcer     Past Surgical History:  Procedure Laterality Date   ABDOMINAL HYSTERECTOMY     BSO   APPENDECTOMY     CESAREAN SECTION     x 2   CHOLECYSTECTOMY     CHONDROPLASTY Left 01/06/2015   Procedure: CHONDROPLASTY;  Surgeon: Marybelle Killings, MD;  Location: [place];  Service: Orthopedics;  Laterality: Left;   COLONOSCOPY      ELBOW FRACTURE SURGERY     right   KNEE ARTHROSCOPY WITH EXCISION PLICA Left 09/25/6043   Procedure: KNEE ARTHROSCOPY WITH EXCISION PLICA;  Surgeon: Marybelle Killings, MD;  Location: [place];  Service: Orthopedics;  Laterality: Left;   Lower Extremity Arterial Dopplers  07/06/2013   RABI 1.0, LABI 1.1.; No evidence of significant vascular atherogenic plaque   NECK SURGERY     NM MYOVIEW LTD  09/2014   LOW RISK. Normal EF of 65% with no regional wall motion abnormalities. No ischemia or infarction.   TONSILLECTOMY     WRIST FRACTURE SURGERY     left, has plate    Medications: Prior to Admission medications   Medication Sig Start Date End Date Taking? Authorizing Provider  AIMOVIG 140 MG/ML SOAJ SMARTSIG:140 Milligram(s) SUB-Q Once a Month 01/09/21   [provider]  ALPRAZolam Duanne Moron) 1 MG tablet TAKE 1 TABLET BY MOUTH AT BEDTIME AS NEEDED FOR ANXIETY 02/06/21   Biagio Borg, MD  benzonatate (TESSALON) 200 MG capsule Take 1 capsule (200 mg total) by mouth 3 (three) times daily as needed for cough. 01/12/21   Biagio Borg, MD  cetirizine (ZYRTEC) 10 MG tablet Take 1 tablet (10 mg total) by mouth daily. 11/02/20   Dara Hoyer, FNP  cyclobenzaprine (FLEXERIL) 5 MG tablet Take 1 tablet by mouth three times daily as needed for muscle spasm 10/21/20   Biagio Borg, MD  DEXILANT 60 MG capsule Take 1 capsule by mouth once daily 10/21/20   Biagio Borg, MD  diphenoxylate-atropine (LOMOTIL) 2.5-0.025 MG tablet Take 1 tablet by mouth 4 (four) times daily as needed for diarrhea or loose stools. 10/10/20   Biagio Borg, MD  EPINEPHrine 0.3 mg/0.3 mL IJ SOAJ injection INJECT AS DIRECTED FOR SEVERE ALLERGIC REACTION 11/02/20   Ambs, Kathrine Cords, FNP  FASENRA 30 MG/ML SOSY Inject into the skin. 03/22/21   [provider]  fluticasone-salmeterol (ADVAIR HFA) 230-21 MCG/ACT inhaler Inhale 2 puffs into the lungs 2 (two) times daily. 01/01/21   Dara Hoyer, FNP  gabapentin (NEURONTIN)  300 MG capsule Take by mouth. 07/13/20   [provider]  guaiFENesin-dextromethorphan (ROBITUSSIN DM) 100-10 MG/5ML syrup Take 5 mLs by mouth every 4 (four) hours as needed for cough. 10/11/20   Biagio Borg, MD  levalbuterol [facility] HFA) 45 MCG/ACT inhaler Inhale 2 puffs into the lungs as directed. 11/02/20   Dara Hoyer, FNP  meclizine (ANTIVERT) 25 MG tablet Take 1 tablet (25 mg total) by mouth 3 (three) times daily as needed for dizziness. 01/02/21   Biagio Borg, MD  mometasone-formoterol [facility]) 200-5 MCG/ACT AERO Inhale 2 puffs into the lungs 2 (two) times daily. 11/02/20   Dara Hoyer, FNP  montelukast (SINGULAIR) 10 MG tablet Take 1 tablet (10 mg total) by mouth at bedtime. 11/02/20  Dara Hoyer, FNP  [facility] 4 MG/0.1ML LIQD nasal spray kit  11/12/19   [provider]  nitroGLYCERIN (NITROSTAT) 0.4 MG SL tablet DISSOLVE ONE TABLET UNDER THE TONGUE EVERY 5 MINUTES AS NEEDED FOR CHEST PAIN.  DO NOT EXCEED A TOTAL OF 3 DOSES IN 15 MINUTES Patient taking differently: DISSOLVE ONE TABLET UNDER THE TONGUE EVERY 5 MINUTES AS NEEDED FOR CHEST PAIN.  DO NOT EXCEED A TOTAL OF 3 DOSES IN 15 MINUTES 04/28/20   Leonie Man, MD  ondansetron (ZOFRAN) 4 MG tablet ondansetron HCl 4 mg tablet    [provider]  pneumococcal 23 valent vaccine (PNEUMOVAX 23) 25 MCG/0.5ML injection Pneumovax-23 25 mcg/0.5 mL injection syringe  ADM 0.5ML IM UTD    [provider]  predniSONE (DELTASONE) 10 MG tablet 3 tabs by mouth per day for 3 days,2tabs per day for 3 days,1tab per day for 3 days 01/12/21   Biagio Borg, MD  rosuvastatin (CRESTOR) 40 MG tablet Take 1 tablet (40 mg total) by mouth daily. 11/15/20   Biagio Borg, MD  triamcinolone (NASACORT) 55 MCG/ACT AERO nasal inhaler Place 2 sprays into the nose daily. 01/02/21   Biagio Borg, MD  valACYclovir (VALTREX) 1000 MG tablet Take 1 tablet by mouth twice daily 02/24/21   Biagio Borg, MD    Allergies:   Allergies   Allergen Reactions   Bee Venom Swelling   Codeine Nausea Only and Other (See Comments)    CAUSES ULCERS   Ibuprofen Nausea And Vomiting   Clonidine Derivatives Other (See Comments)    Unknown    Statins    Tramadol Nausea Only   Latex Rash   Levofloxacin Nausea Only   Propoxyphene N-Acetaminophen Nausea Only    Social History:  reports that she has been smoking cigarettes. She started smoking about 43 years ago. She has a 19.50 pack-year smoking history. She has never used smokeless tobacco. She reports that she does not drink alcohol and does not use drugs.  Family History: Family History  Problem Relation Age of Onset   Emphysema Father    Dementia Mother    Diabetes Maternal Grandmother    Diabetes Maternal Aunt    Diabetes Other        mat. cousin   Heart disease Brother    Colon cancer Neg Hx    Allergic rhinitis Neg Hx    Angioedema Neg Hx    Asthma Neg Hx    Atopy Neg Hx    Eczema Neg Hx    Immunodeficiency Neg Hx    Urticaria Neg Hx     Physical Exam: Blood pressure 132/66, pulse 67, temperature 98.5 F (36.9 C), temperature source Oral, resp. rate 16, height 5' 4" (1.626 m), weight 56.7 kg, SpO2 99 %. General: Alert, awake, oriented x3, in no acute distress. Eyes: pink conjunctiva,anicteric sclera, pupils equal and reactive to light and accomodation, HEENT: normocephalic, atraumatic, oropharynx clear Neck: supple, no masses or lymphadenopathy, no goiter, no bruits, no JVD CVS: Regular rate and rhythm, without murmurs, rubs or gallops. No lower extremity edema Resp : Clear to auscultation bilaterally, no wheezing, rales or rhonchi. GI : Soft, nontender, nondistended, positive bowel sounds,  No hernia.  Musculoskeletal: No clubbing or cyanosis, positive pedal pulses. No contracture. ROM intact  Neuro: Grossly intact, no focal neurological deficits, strength 5/5 upper and lower extremities bilaterally Psych: alert and oriented x 3, normal mood and  affect Skin: no rashes or lesions, warm and  dry   LABS on Admission: I have personally reviewed all the labs and imagings below    Basic Metabolic Panel: Recent Labs  Lab 03/28/21 0542  NA 137  K 4.2  CL 106  CO2 22  GLUCOSE 101*  BUN 16  CREATININE 1.99*  CALCIUM 9.3   Liver Function Tests: No results for input(s): AST, ALT, ALKPHOS, BILITOT, PROT, ALBUMIN in the last 168 hours. No results for input(s): LIPASE, AMYLASE in the last 168 hours. No results for input(s): AMMONIA in the last 168 hours. CBC: Recent Labs  Lab 03/28/21 0542  WBC 8.3  NEUTROABS 5.2  HGB 14.4  HCT 41.2  MCV 95.2  PLT 270   Cardiac Enzymes: No results for input(s): CKTOTAL, CKMB, CKMBINDEX, TROPONINI in the last 168 hours. BNP: Invalid input(s): POCBNP CBG: No results for input(s): GLUCAP in the last 168 hours.  Radiological Exams on Admission:  CT HEAD WO CONTRAST  Result Date: 03/28/2021 CLINICAL DATA:  65 year old female with dizziness. Increasing weakness. EXAM: CT HEAD WITHOUT CONTRAST TECHNIQUE: Contiguous axial images were obtained from the base of the skull through the vertex without intravenous contrast. COMPARISON:  Brain MRI 10/21/2018.  Head CT 04/07/2015. FINDINGS: Brain: Mild generalized cerebral volume loss since 2016. No midline shift, ventriculomegaly, mass effect, evidence of mass lesion, intracranial hemorrhage or evidence of cortically based acute infarction. No cortical encephalomalacia identified. Minimal to mild for age scattered white matter hypodensity, mostly subcortical. Vascular: No suspicious intracranial vascular hyperdensity. Skull: Stable, negative. Sinuses/Orbits: Opacified posterior left ethmoid air cell is new since 2016, and there was mild mucosal thickening there in 2020. Other paranasal sinuses and mastoids are clear. Tympanic cavities are clear. Other: Postoperative changes to both globes. No acute orbit or scalp soft tissue finding. IMPRESSION: 1. No acute  intracranial abnormality. Mild for age nonspecific cerebral white matter changes, most commonly due to chronic small vessel disease. 2. Isolated posterior left ethmoid sinus disease. Electronically Signed   By: Genevie Ann M.D.   On: 03/28/2021 06:43   DG Chest Port 1 View  Result Date: 03/28/2021 CLINICAL DATA:  65 year old female with history of chest pain and shortness of breath for the past 3 weeks. History of COPD. EXAM: PORTABLE CHEST 1 VIEW COMPARISON:  Chest x-ray 01/12/2021. FINDINGS: Lung volumes are slightly low. No consolidative airspace disease. Small left pleural effusion. No right pleural effusion. No pneumothorax. No suspicious appearing pulmonary nodules or masses are noted. No evidence of pulmonary edema. Heart size is normal. Atherosclerotic calcifications in the thoracic aorta. IMPRESSION: 1. Small left pleural effusion. 2. Aortic atherosclerosis. Electronically Signed   By: Vinnie Langton M.D.   On: 03/28/2021 06:09      EKG: Independently reviewed.  Rate 85, normal sinus rhythm, no acute ST-T wave changes suggestive of ischemia   Assessment/Plan Principal Problem:   Chest pain with near syncope  -Presenting with acute chest pain since last night with near syncopal episodes.  Orthostatic negative - Covid negative  -Troponin 79 -> 83, obtain serial cardiac enzymes, received aspirin 325 mg x 1 -Follow 2D echo, cardiology consulted -Has history of vertigo, CT head showed no acute intracranial abnormality, chronic small vessel disease  Active Problems: Acute kidney injury -Likely due to poor appetite in the last few weeks.  Presented with creatinine of 1.99, baseline creatinine 0.9 on 11/14/2020 -Placed on IV fluids hydration, follow renal ultrasound.  UA negative for UTI    Hyperlipidemia LDL goal <100 -Continue statin  Nicotine abuse -Patient reports smoking 1  pack/day for >, will place on nicotine patch  -Counseled on smoking cessation  COPD with chronic  bronchitis -Currently no acute wheezing, but feels congested, coughing.  Chest x-ray showed no pneumonia. -placed on Dulera, xopenex inhaler, singulair, add doxycycline  -Add antitussives, Tessalon Perles, Robitussin, flutter valve    Borderline hypertension -Not on any antihypertensives outpatient, will monitor      Anxiety state -Continue Xanax    Gait instability, generalized weakness -Obtain PT OT evaluation - Has history of vertigo, CT head showed no acute intracranial abnormality.  Continue meclizine.  DVT prophylaxis: heparin SQ   CODE STATUS: full code   Consults called: cardiology   Family Communication: Admission, patients condition and plan of care including tests being ordered have been discussed with the patient who indicates understanding and agree with the plan and Code Status  Admission status:   The medical decision making on this patient was of high complexity and the patient is at high risk for clinical deterioration, therefore this is a level 3 admission.  Severity of Illness:      The appropriate patient status for this patient is OBSERVATION. Observation status is judged to be reasonable and necessary in order to provide the required intensity of service to ensure the patient's safety. The patient's presenting symptoms, physical exam findings, and initial radiographic and laboratory data in the context of their medical condition is felt to place them at decreased risk for further clinical deterioration. Furthermore, it is anticipated that the patient will be medically stable for discharge from the hospital within 2 midnights of admission. The following factors support the patient status of observation.   " The patient's presenting symptoms include chest pain with near syncopal episodes " The physical exam findings include chest pain " The initial radiographic and laboratory data are + troponin  Time Spent on Admission: 72mns         M.D. [facility] 03/28/2021, 9:58 AM

## 2021-03-28 NOTE — ED Triage Notes (Signed)
Patient arrives with anterior CP and weakness for a few weeks that has gotten progressively worse. Slight non-productive cough, hx COPD. Patient in NAD  97% 137/60 84 108 CBG Normal 12 lead

## 2021-03-29 DIAGNOSIS — I252 Old myocardial infarction: Secondary | ICD-10-CM | POA: Diagnosis not present

## 2021-03-29 DIAGNOSIS — K219 Gastro-esophageal reflux disease without esophagitis: Secondary | ICD-10-CM | POA: Diagnosis present

## 2021-03-29 DIAGNOSIS — G4700 Insomnia, unspecified: Secondary | ICD-10-CM | POA: Diagnosis present

## 2021-03-29 DIAGNOSIS — Z825 Family history of asthma and other chronic lower respiratory diseases: Secondary | ICD-10-CM | POA: Diagnosis not present

## 2021-03-29 DIAGNOSIS — K529 Noninfective gastroenteritis and colitis, unspecified: Secondary | ICD-10-CM | POA: Diagnosis present

## 2021-03-29 DIAGNOSIS — I700 Atherosclerosis of aorta: Secondary | ICD-10-CM | POA: Diagnosis present

## 2021-03-29 DIAGNOSIS — N179 Acute kidney failure, unspecified: Secondary | ICD-10-CM | POA: Diagnosis present

## 2021-03-29 DIAGNOSIS — Z20822 Contact with and (suspected) exposure to covid-19: Secondary | ICD-10-CM | POA: Diagnosis present

## 2021-03-29 DIAGNOSIS — E860 Dehydration: Secondary | ICD-10-CM | POA: Diagnosis present

## 2021-03-29 DIAGNOSIS — R112 Nausea with vomiting, unspecified: Secondary | ICD-10-CM | POA: Diagnosis not present

## 2021-03-29 DIAGNOSIS — R262 Difficulty in walking, not elsewhere classified: Secondary | ICD-10-CM | POA: Diagnosis present

## 2021-03-29 DIAGNOSIS — Z9049 Acquired absence of other specified parts of digestive tract: Secondary | ICD-10-CM | POA: Diagnosis not present

## 2021-03-29 DIAGNOSIS — J449 Chronic obstructive pulmonary disease, unspecified: Secondary | ICD-10-CM | POA: Diagnosis not present

## 2021-03-29 DIAGNOSIS — E785 Hyperlipidemia, unspecified: Secondary | ICD-10-CM | POA: Diagnosis present

## 2021-03-29 DIAGNOSIS — R269 Unspecified abnormalities of gait and mobility: Secondary | ICD-10-CM | POA: Diagnosis not present

## 2021-03-29 DIAGNOSIS — E538 Deficiency of other specified B group vitamins: Secondary | ICD-10-CM | POA: Diagnosis present

## 2021-03-29 DIAGNOSIS — Z79899 Other long term (current) drug therapy: Secondary | ICD-10-CM | POA: Diagnosis not present

## 2021-03-29 DIAGNOSIS — F32A Depression, unspecified: Secondary | ICD-10-CM | POA: Diagnosis present

## 2021-03-29 DIAGNOSIS — R030 Elevated blood-pressure reading, without diagnosis of hypertension: Secondary | ICD-10-CM | POA: Diagnosis present

## 2021-03-29 DIAGNOSIS — J309 Allergic rhinitis, unspecified: Secondary | ICD-10-CM | POA: Diagnosis present

## 2021-03-29 DIAGNOSIS — R42 Dizziness and giddiness: Secondary | ICD-10-CM | POA: Diagnosis not present

## 2021-03-29 DIAGNOSIS — F17210 Nicotine dependence, cigarettes, uncomplicated: Secondary | ICD-10-CM | POA: Diagnosis present

## 2021-03-29 DIAGNOSIS — J439 Emphysema, unspecified: Secondary | ICD-10-CM | POA: Diagnosis present

## 2021-03-29 DIAGNOSIS — R109 Unspecified abdominal pain: Secondary | ICD-10-CM | POA: Diagnosis not present

## 2021-03-29 DIAGNOSIS — R55 Syncope and collapse: Secondary | ICD-10-CM | POA: Diagnosis not present

## 2021-03-29 DIAGNOSIS — F411 Generalized anxiety disorder: Secondary | ICD-10-CM | POA: Diagnosis present

## 2021-03-29 DIAGNOSIS — Z90710 Acquired absence of both cervix and uterus: Secondary | ICD-10-CM | POA: Diagnosis not present

## 2021-03-29 DIAGNOSIS — I2510 Atherosclerotic heart disease of native coronary artery without angina pectoris: Secondary | ICD-10-CM | POA: Diagnosis present

## 2021-03-29 DIAGNOSIS — I208 Other forms of angina pectoris: Secondary | ICD-10-CM | POA: Diagnosis not present

## 2021-03-29 DIAGNOSIS — M5136 Other intervertebral disc degeneration, lumbar region: Secondary | ICD-10-CM | POA: Diagnosis present

## 2021-03-29 DIAGNOSIS — R1030 Lower abdominal pain, unspecified: Secondary | ICD-10-CM | POA: Diagnosis not present

## 2021-03-29 LAB — CBC
HCT: 37.6 % (ref 36.0–46.0)
Hemoglobin: 13 g/dL (ref 12.0–15.0)
MCH: 33.2 pg (ref 26.0–34.0)
MCHC: 34.6 g/dL (ref 30.0–36.0)
MCV: 95.9 fL (ref 80.0–100.0)
Platelets: 207 10*3/uL (ref 150–400)
RBC: 3.92 MIL/uL (ref 3.87–5.11)
RDW: 13.5 % (ref 11.5–15.5)
WBC: 6.9 10*3/uL (ref 4.0–10.5)
nRBC: 0 % (ref 0.0–0.2)

## 2021-03-29 LAB — BASIC METABOLIC PANEL
Anion gap: 7 (ref 5–15)
BUN: 18 mg/dL (ref 8–23)
CO2: 25 mmol/L (ref 22–32)
Calcium: 9.1 mg/dL (ref 8.9–10.3)
Chloride: 108 mmol/L (ref 98–111)
Creatinine, Ser: 1.39 mg/dL — ABNORMAL HIGH (ref 0.44–1.00)
GFR, Estimated: 42 mL/min — ABNORMAL LOW (ref >60)
Glucose, Bld: 148 mg/dL — ABNORMAL HIGH (ref 70–99)
Potassium: 3.9 mmol/L (ref 3.5–5.1)
Sodium: 140 mmol/L (ref 135–145)

## 2021-03-29 LAB — TROPONIN I (HIGH SENSITIVITY): Troponin I (High Sensitivity): <2 ng/L (ref <18)

## 2021-03-29 MED ORDER — ALBUTEROL SULFATE (2.5 MG/3ML) 0.083% IN NEBU: 2.5000 mg | INHALATION_SOLUTION | RESPIRATORY_TRACT | Status: DC | PRN

## 2021-03-29 MED ORDER — BENZONATATE 100 MG PO CAPS
200.0000 mg | ORAL_CAPSULE | ORAL | Status: DC | PRN
  Administered 2021-03-29 – 2021-04-02 (×5): 200 mg via ORAL
  Filled 2021-03-29 (×5): qty 2

## 2021-03-29 MED ORDER — LACTATED RINGERS IV SOLN: INTRAVENOUS | Status: DC

## 2021-03-29 NOTE — Progress Notes (Signed)
Lori [name]  UEA:[id number] DOB: Jul 08, 1956 DOA: 03/28/2021 PCP: Biagio Borg, MD    Brief Narrative:  65 year old with a history of CAD, COPD, tobacco abuse, GERD, HH, depression, and HLD who presented to the ED with chest pain and shortness of breath accompanied by dizziness which had progressively been worsening over a 3-week timeframe.  Patient described the chest pain as midsternal and sharp.  She further reported severe generalized weakness making it difficult for her to ambulate.  Significant Events:  7/13 admit via ED  Consultants:  Cardiology  Code Status: FULL CODE  Antimicrobials:  None  DVT prophylaxis: Subcu heparin  Subjective: Reports ongoing chest discomfort.  Reports very poor appetite with limited intake.  States she is still feels lightheaded when attempting to sit up or stand.  Reports she feels very weak in general.  Assessment & Plan:  Chest pain no worrisome clinical symptoms suggestive of USAP -no plans for further cardiac evaluation at this time  Acute kidney injury -dehydration - near syncope  Appears to be simply due to very poor intake likely related to depression -hydrate and follow -improving with IV fluid thus far Recent Labs  Lab 03/28/21 0542 03/28/21 1430 03/29/21 0446  CREATININE 1.99* 1.68* 1.39*    HLD Hold medical therapy for now with very poor intake  Tobacco abuse Counseled on need to discontinue smoking  COPD with chronic bronchitis No acute flare at the present time  Borderline elevated blood pressure Monitor trend  Chronic anxiety  Gait instability/generalized weakness Hydrate and await PT/OT evaluations    Family Communication:  Status is: Observation  The patient will require care spanning > 2 midnights and should be moved to inpatient because: Inpatient level of care appropriate due to severity of illness  Dispo: The patient is from: Home              Anticipated d/c is to:  unclear               Patient currently is not medically stable to d/c.   Difficult to place patient No  Objective: Blood pressure 121/72, pulse 79, temperature 98.2 F (36.8 C), temperature source Oral, resp. rate 14, height 5\' 4"  (1.626 m), weight 56.7 kg, SpO2 99 %.  Intake/Output Summary (Last 24 hours) at 03/29/2021 1904 Last data filed at 03/29/2021 1500 Gross per 24 hour  Intake 1396.54 ml  Output --  Net 1396.54 ml   Filed Weights   03/28/21 0523  Weight: 56.7 kg    Examination: General: No acute respiratory distress -frail-appearing -flushed Lungs: Clear to auscultation bilaterally without wheezes or crackles Cardiovascular: Regular rate and rhythm without murmur gallop or rub normal S1 and S2 Abdomen: Nontender, nondistended, soft, bowel sounds positive, no rebound, no ascites, no appreciable mass Extremities: No significant cyanosis, clubbing, or edema bilateral lower extremities  CBC: Recent Labs  Lab 03/28/21 0542 03/28/21 1430 03/29/21 0446  WBC 8.3 7.7 6.9  NEUTROABS 5.2  --   --   HGB 14.4 15.2* 13.0  HCT 41.2 43.3 37.6  MCV 95.2 95.2 95.9  PLT 270 245 478   Basic Metabolic Panel: Recent Labs  Lab 03/28/21 0542 03/28/21 1430 03/29/21 0446  NA 137  --  140  K 4.2  --  3.9  CL 106  --  108  CO2 22  --  25  GLUCOSE 101*  --  148*  BUN 16  --  18  CREATININE 1.99* 1.68* 1.39*  CALCIUM 9.3  --  9.1   GFR: Estimated Creatinine Clearance: 35.3 mL/min (A) (by C-G formula based on SCr of 1.39 mg/dL (H)).  Liver Function Tests: No results for input(s): AST, ALT, ALKPHOS, BILITOT, PROT, ALBUMIN in the last 168 hours. No results for input(s): LIPASE, AMYLASE in the last 168 hours. No results for input(s): AMMONIA in the last 168 hours.  Coagulation Profile: No results for input(s): INR, PROTIME in the last 168 hours.  Cardiac Enzymes: No results for input(s): CKTOTAL, CKMB, CKMBINDEX, TROPONINI in the last 168 hours.  HbA1C: Hgb A1c MFr Bld  Date/Time Value Ref Range  Status  11/14/2020 11:50 AM 5.2 4.6 - 6.5 % Final    Comment:    Glycemic Control Guidelines for People with Diabetes:Non Diabetic:  <6%Goal of Therapy: <7%Additional Action Suggested:  >8%   11/12/2019 11:32 AM 5.6 4.6 - 6.5 % Final    Comment:    Glycemic Control Guidelines for People with Diabetes:Non Diabetic:  <6%Goal of Therapy: <7%Additional Action Suggested:  >8%     CBG: No results for input(s): GLUCAP in the last 168 hours.  Recent Results (from the past 240 hour(s))  Resp Panel by RT-PCR (Flu A&B, Covid) Nasopharyngeal Swab     Status: None   Collection Time: 03/28/21  5:33 AM   Specimen: Nasopharyngeal Swab; Nasopharyngeal(NP) swabs in vial transport medium  Result Value Ref Range Status   SARS Coronavirus 2 by RT PCR NEGATIVE NEGATIVE Final    Comment: (NOTE) SARS-CoV-2 target nucleic acids are NOT DETECTED.  The SARS-CoV-2 RNA is generally detectable in upper respiratory specimens during the acute phase of infection. The lowest concentration of SARS-CoV-2 viral copies this assay can detect is 138 copies/mL. A negative result does not preclude SARS-Cov-2 infection and should not be used as the sole basis for treatment or other patient management decisions. A negative result may occur with  improper specimen collection/handling, submission of specimen other than nasopharyngeal swab, presence of viral mutation(s) within the areas targeted by this assay, and inadequate number of viral copies(<138 copies/mL). A negative result must be combined with clinical observations, patient history, and epidemiological information. The expected result is Negative.  Fact Sheet for Patients:  EntrepreneurPulse.com.au  Fact Sheet for Healthcare Providers:  IncredibleEmployment.be  This test is no t yet approved or cleared by the [place] FDA and  has been authorized for detection and/or diagnosis of SARS-CoV-2 by FDA under an Emergency Use  Authorization (EUA). This EUA will remain  in effect (meaning this test can be used) for the duration of the COVID-19 declaration under Section 564(b)(1) of the Act, 21 U.S.C.section 360bbb-3(b)(1), unless the authorization is terminated  or revoked sooner.       Influenza A by PCR NEGATIVE NEGATIVE Final   Influenza B by PCR NEGATIVE NEGATIVE Final    Comment: (NOTE) The Xpert Xpress SARS-CoV-2/FLU/RSV plus assay is intended as an aid in the diagnosis of influenza from Nasopharyngeal swab specimens and should not be used as a sole basis for treatment. Nasal washings and aspirates are unacceptable for Xpert Xpress SARS-CoV-2/FLU/RSV testing.  Fact Sheet for Patients: EntrepreneurPulse.com.au  Fact Sheet for Healthcare Providers: IncredibleEmployment.be  This test is not yet approved or cleared by the [place] FDA and has been authorized for detection and/or diagnosis of SARS-CoV-2 by FDA under an Emergency Use Authorization (EUA). This EUA will remain in effect (meaning this test can be used) for the duration of the COVID-19 declaration under Section 564(b)(1) of the Act, 21 U.S.C. section 360bbb-3(b)(1), unless  the authorization is terminated or revoked.  Performed at [facility], [place] [address], [place], [place] [address]      Scheduled Meds:  ALPRAZolam  1 mg Oral QHS   gabapentin  300 mg Oral BID   heparin  5,000 Units Subcutaneous Q8H   loratadine  10 mg Oral Daily   mometasone-formoterol  2 puff Inhalation BID   montelukast  10 mg Oral QHS   pantoprazole  40 mg Oral Daily   rosuvastatin  40 mg Oral Daily   valACYclovir  1,000 mg Oral BID   Continuous Infusions:  lactated ringers 100 mL/hr at 03/29/21 1331     LOS: 0 days   Cherene Altes, MD [facility] Office  [phone] Pager - Text Page per Shea Evans  If 7PM-7AM, please contact night-coverage per Amion 03/29/2021, 7:04 PM

## 2021-03-29 NOTE — ED Notes (Signed)
Patient is in room eating, she asked to finish eating before transporting upstairs. Will transport after she finishes lunch

## 2021-03-30 DIAGNOSIS — I208 Other forms of angina pectoris: Secondary | ICD-10-CM | POA: Diagnosis not present

## 2021-03-30 LAB — COMPREHENSIVE METABOLIC PANEL
ALT: 10 U/L (ref 0–44)
AST: 13 U/L — ABNORMAL LOW (ref 15–41)
Albumin: 3.1 g/dL — ABNORMAL LOW (ref 3.5–5.0)
Alkaline Phosphatase: 59 U/L (ref 38–126)
Anion gap: 8 (ref 5–15)
BUN: 16 mg/dL (ref 8–23)
CO2: 23 mmol/L (ref 22–32)
Calcium: 8.7 mg/dL — ABNORMAL LOW (ref 8.9–10.3)
Chloride: 107 mmol/L (ref 98–111)
Creatinine, Ser: 0.92 mg/dL (ref 0.44–1.00)
GFR, Estimated: >60 mL/min (ref >60)
Glucose, Bld: 105 mg/dL — ABNORMAL HIGH (ref 70–99)
Potassium: 4.1 mmol/L (ref 3.5–5.1)
Sodium: 138 mmol/L (ref 135–145)
Total Bilirubin: 0.4 mg/dL (ref 0.3–1.2)
Total Protein: 5.5 g/dL — ABNORMAL LOW (ref 6.5–8.1)

## 2021-03-30 LAB — CBC
HCT: 40.8 % (ref 36.0–46.0)
Hemoglobin: 14.5 g/dL (ref 12.0–15.0)
MCH: 33.3 pg (ref 26.0–34.0)
MCHC: 35.5 g/dL (ref 30.0–36.0)
MCV: 93.6 fL (ref 80.0–100.0)
Platelets: 167 10*3/uL (ref 150–400)
RBC: 4.36 MIL/uL (ref 3.87–5.11)
RDW: 13.2 % (ref 11.5–15.5)
WBC: 7.2 10*3/uL (ref 4.0–10.5)
nRBC: 0 % (ref 0.0–0.2)

## 2021-03-30 LAB — VITAMIN B12: Vitamin B-12: 99 pg/mL — ABNORMAL LOW (ref 180–914)

## 2021-03-30 LAB — FOLATE: Folate: 4.6 ng/mL — ABNORMAL LOW (ref >5.9)

## 2021-03-30 MED ORDER — ONDANSETRON HCL 4 MG/2ML IJ SOLN
4.0000 mg | Freq: Four times a day (QID) | INTRAMUSCULAR | Status: DC | PRN
  Administered 2021-03-30 – 2021-04-01 (×5): 4 mg via INTRAVENOUS
  Filled 2021-03-30 (×5): qty 2

## 2021-03-30 MED ORDER — CYANOCOBALAMIN 1000 MCG/ML IJ SOLN
1000.0000 ug | Freq: Every day | INTRAMUSCULAR | Status: DC
  Administered 2021-03-30 – 2021-04-01 (×3): 1000 ug via SUBCUTANEOUS
  Filled 2021-03-30 (×4): qty 1

## 2021-03-30 MED ORDER — TRAMADOL HCL 50 MG PO TABS
50.0000 mg | ORAL_TABLET | Freq: Four times a day (QID) | ORAL | Status: DC | PRN
  Administered 2021-03-30 – 2021-04-02 (×6): 50 mg via ORAL
  Filled 2021-03-30 (×6): qty 1

## 2021-03-30 MED ORDER — ACETAMINOPHEN 325 MG PO TABS
650.0000 mg | ORAL_TABLET | Freq: Four times a day (QID) | ORAL | Status: DC | PRN
  Administered 2021-03-31: 650 mg via ORAL
  Filled 2021-03-30: qty 2

## 2021-03-30 MED ORDER — FOLIC ACID 1 MG PO TABS
1.0000 mg | ORAL_TABLET | Freq: Every day | ORAL | Status: DC
  Administered 2021-03-31 – 2021-04-02 (×3): 1 mg via ORAL
  Filled 2021-03-30 (×3): qty 1

## 2021-03-30 MED ORDER — ALUM & MAG HYDROXIDE-SIMETH 200-200-20 MG/5ML PO SUSP
15.0000 mL | Freq: Four times a day (QID) | ORAL | Status: DC | PRN
  Administered 2021-03-30 – 2021-04-02 (×5): 15 mL via ORAL
  Filled 2021-03-30 (×5): qty 30

## 2021-03-30 MED ORDER — SODIUM CHLORIDE 0.9 % IV SOLN
1.0000 mg | Freq: Once | INTRAVENOUS | Status: AC
  Administered 2021-03-30: 1 mg via INTRAVENOUS
  Filled 2021-03-30: qty 0.2

## 2021-03-30 MED ORDER — METOCLOPRAMIDE HCL 5 MG/ML IJ SOLN
5.0000 mg | Freq: Four times a day (QID) | INTRAMUSCULAR | Status: AC
  Administered 2021-03-30 – 2021-03-31 (×4): 5 mg via INTRAVENOUS
  Filled 2021-03-30 (×4): qty 2

## 2021-03-30 NOTE — Progress Notes (Signed)
Mobility Specialist - Progress Note    03/30/21 1545  Mobility  Activity Ambulated in hall  Level of Assistance Modified independent, requires aide device or extra time  Assistive Device Front wheel walker  Distance Ambulated (ft) 900 ft  Mobility Ambulated independently in hallway  Mobility Response Tolerated well  Mobility performed by Mobility specialist (3:00-3:40)  $Mobility charge 1 Mobility    Pt is eager to walk. Pt ambulated 900 ft with RW in hallway and no sign of SOB or dizziness. Pt returned to room to use bathroom and reported seeing red spots on her thighs. RN was called and notified. Pt was eager to walk again. Pt returned to bed after 2 laps in hallway with call bell at side and bed alarm on.  [place] Specialist Acute Rehabilitation Services Office: [phone] 03/30/21, 3:48 PM

## 2021-03-30 NOTE — Evaluation (Signed)
Occupational Therapy Evaluation Patient Details Name: Lori [name] MRN: [id number] DOB: [birth year] Today's Date: 03/30/2021    History of Present Illness Patient is 65 y.o. female presented to [facility] with c/o chest pain, weakness, dizziness. PMH significant for CAD, COPD, GERD, anxiety, depression, hiatal hernia, HLD, neck surgery.   Clinical Impression   [name] is a 65 year old woman who typically lives alone and is independent without a device. On evaluation she presents with generalized weakness, decreased activity tolerance and impaired balance. Today patient reports feeling "bad" and she "can't do much today." Patient demonstrates ability to perform bed mobility with supervision, min guard for standing to take steps to head of bed and overall min assist for ADLs for steadying and supervision. Patient is currently a high fall risk due to poor balance and is not safe to return home. Recommend short term rehab at discharge .     Follow Up Recommendations  SNF    Equipment Recommendations  3 in 1 bedside commode;Other (comment) (RW)    Recommendations for Other Services       Precautions / Restrictions Precautions Precautions: Fall Precaution Comments: denies falls in last 6 months Restrictions Weight Bearing Restrictions: No      Mobility Bed Mobility Overal bed mobility: Needs Assistance Bed Mobility: Supine to Sit;Sit to Supine     Supine to sit: Supervision Sit to supine: Supervision   General bed mobility comments: increased time perform but no physical assistance    Transfers Overall transfer level: Needs assistance Equipment used: 1 person hand held assist Transfers: Sit to/from Stand Sit to Stand: Min guard         General transfer comment: Patient reports feeling poorly and not wanting to do much. Patient min guard to stand and take steps to head of the bed. She is unsteady - using bed to hold onto - but no overt loss of balance. therapist  recommended use of walker. Patient wanting to return to bed    Balance Overall balance assessment: Mild deficits observed, not formally tested Sitting-balance support: No upper extremity supported Sitting balance-Leahy Scale: Good     Standing balance support: Single extremity supported Standing balance-Leahy Scale: Poor                             ADL either performed or assessed with clinical judgement   ADL Overall ADL's : Needs assistance/impaired Eating/Feeding: Independent   Grooming: Set up   Upper Body Bathing: Set up;Sitting   Lower Body Bathing: Set up;Sit to/from stand   Upper Body Dressing : Set up;Sitting   Lower Body Dressing: Set up;Sit to/from stand Lower Body Dressing Details (indicate cue type and reason): able to don socks using figure four method Toilet Transfer: Minimal assistance;Regular Toilet;Grab bars Toilet Transfer Details (indicate cue type and reason): reports just ambulating to bathroom with nursing. able to perform toileting. Needs steadying assistance. Toileting- Water quality scientist and Hygiene: Min guard;Sit to/from stand       Functional mobility during ADLs: Minimal assistance       Vision Patient Visual Report: No change from baseline       Perception     Praxis      Pertinent Vitals/Pain Pain Assessment: No/denies pain     Hand Dominance Right   Extremity/Trunk Assessment Upper Extremity Assessment Upper Extremity Assessment: Overall WFL for tasks assessed   Lower Extremity Assessment Lower Extremity Assessment: Defer to PT evaluation  Cervical / Trunk Assessment Cervical / Trunk Assessment: Kyphotic Cervical / Trunk Exceptions: limited cervical ROM and pt reports pain with cervical rotation and flex/ext   Communication Communication Communication: No difficulties   Cognition Arousal/Alertness: Awake/alert Behavior During Therapy: WFL for tasks assessed/performed Overall Cognitive Status: Within  Functional Limits for tasks assessed                                     General Comments       Exercises     Shoulder Instructions      Home Living Family/patient expects to be discharged to:: Private residence Living Arrangements: Alone Available Help at Discharge: Friend(s) Type of Home: Mobile home Home Access: Stairs to enter [employer] of Steps: 4-5 Entrance Stairs-Rails: Right;Left;Can reach both Home Layout: One level     Bathroom Shower/Tub: Tub/shower unit (holds onto washing machine to get out of tub)   Bathroom Toilet: Standard Bathroom Accessibility: Yes   Home Equipment: None          Prior Functioning/Environment Level of Independence: Independent        Comments: pt usually gets transportation from friends, doesn't drive. pt does her own cooking/cleaning. reports she furniture surfs when mobilizing in home.        OT Problem List: Decreased strength;Decreased activity tolerance;Impaired balance (sitting and/or standing);Decreased knowledge of use of DME or AE      OT Treatment/Interventions: Self-care/ADL training;DME and/or AE instruction;Therapeutic activities;Balance training;Patient/family education    OT Goals(Current goals can be found in the care plan section) Acute Rehab OT Goals Patient Stated Goal: get stronger to go home OT Goal Formulation: With patient Time For Goal Achievement: 04/13/21 Potential to Achieve Goals: Good  OT Frequency: Min 2X/week   Barriers to D/C:            Co-evaluation              AM-PAC OT "6 Clicks" Daily Activity     Outcome Measure Help from another person eating meals?: None Help from another person taking care of personal grooming?: A Little Help from another person toileting, which includes using toliet, bedpan, or urinal?: A Little Help from another person bathing (including washing, rinsing, drying)?: A Little Help from another person to put on and taking off  regular upper body clothing?: A Little Help from another person to put on and taking off regular lower body clothing?: A Little 6 Click Score: 19   End of Session Nurse Communication: Mobility status  Activity Tolerance: Patient limited by fatigue Patient left: in bed;with call bell/phone within reach;with bed alarm set  OT Visit Diagnosis: Unsteadiness on feet (R26.81)                Time: 7262-0355 OT Time Calculation (min): 9 min Charges:  OT General Charges $OT Visit: 1 Visit OT Evaluation $OT Eval Low Complexity: 1 Low  Edie Vallandingham, OTR/L [place]  Office [phone] Pager: [place] 03/30/2021, 10:05 AM

## 2021-03-30 NOTE — TOC Initial Note (Addendum)
Transition of Care [facility]) - Initial/Assessment Note    Patient Details  Name: Lori [name] MRN: [id number] Date of Birth: 1955-12-11  Transition of [facility]) CM/SW Contact:    Trish Mage, LCSW Phone Number: 03/30/2021, 3:49 PM  Clinical Narrative:  Patient seen in follow up to OT/PT recommendation of SNF.  [name] lives here in [place] with her dog, has sister for support.  No DME.  Is not interested in SNF.  Plans to return home.  Open to [facility] services and would like a rolling walker, rejected offer of 3 in 1 and tub bench. TOC will continue to follow during the course of hospitalization.  AddendumMadilyn Hook with Alvis Lemmings agrees to [facility] service                  Expected Discharge Plan: [place] Barriers to Discharge: No Barriers Identified   Patient Goals and CMS Choice     Choice offered to / list presented to : Patient  Expected Discharge Plan and Services Expected Discharge Plan: [place]   Discharge Planning Services: CM Consult Post Acute Care Choice: [place] arrangements for the past 2 months: Mobile Home                                      Prior Living Arrangements/Services Living arrangements for the past 2 months: Mobile Home Lives with:: Self Patient language and need for interpreter reviewed:: Yes Do you feel safe going back to the place where you live?: Yes      Need for Family Participation in Patient Care: Yes (Comment) Care giver support system in place?: Yes (comment)   Criminal Activity/Legal Involvement Pertinent to Current Situation/Hospitalization: No - Comment as needed  Activities of Daily Living Home Assistive Devices/Equipment: Eyeglasses, Cane (specify quad or straight) (single point cane) ADL Screening (condition at time of admission) Patient's cognitive ability adequate to safely complete daily activities?: Yes Is the patient deaf or have difficulty hearing?: No Does the  patient have difficulty seeing, even when wearing glasses/contacts?: No Does the patient have difficulty concentrating, remembering, or making decisions?: No Patient able to express need for assistance with ADLs?: Yes Does the patient have difficulty dressing or bathing?: No Independently performs ADLs?: Yes (appropriate for developmental age) Does the patient have difficulty walking or climbing stairs?: Yes (secondary to shortness of breath and weakness) Weakness of Legs: Both Weakness of Arms/Hands: None  Permission Sought/Granted                  Emotional Assessment Appearance:: Appears stated age Attitude/Demeanor/Rapport: Engaged Affect (typically observed): Appropriate Orientation: : Oriented to Self, Oriented to Place, Oriented to Situation, Oriented to  Time Alcohol / Substance Use: Not Applicable Psych Involvement: No (comment)  Admission diagnosis:  AKI (acute kidney injury) ([place]) [N17.9] Near syncope [R55] Acute kidney injury ([place]) [N17.9] Chest pain [R07.9] Patient Active Problem List   Diagnosis Date Noted   Acute kidney injury ([place]) 03/29/2021   Chest pain 03/28/2021   AKI (acute kidney injury) ([place]) 03/28/2021   Allergic rhinitis 01/08/2021   Abnormal urine odor 11/14/2020   Aortic atherosclerosis ([place]) 11/14/2020   Severe persistent asthma without complication 28/41/3244   Hymenoptera allergy 11/02/2020   Genital herpes 11/12/2019   Coccyx pain 11/12/2019   Insomnia 11/12/2019   Acute right-sided low back pain without sciatica 07/20/2019  Grief 07/20/2019   Vitamin D deficiency 07/20/2019   Hyperglycemia 04/21/2019   Left knee pain 04/21/2019   Pain 12/29/2018   B12 deficiency 10/20/2018   Bilateral hand pain 08/03/2018   Nausea 04/27/2018   Anemia, iron deficiency 04/27/2018   Intermittent paresthesia of hand and foot 04/27/2018   Headache 03/21/2018   Lumbar radiculitis 08/15/2017   Degenerative disc disease, lumbar 07/21/2017   Encounter  for well adult exam with abnormal findings 06/30/2017   Epigastric pain 06/30/2017   Restrictive lung disease 06/30/2017   Leg cramping 06/09/2017   Elbow contusion 06/09/2017   Right elbow pain 05/27/2017   Asthma-COPD overlap syndrome ([place]) 01/31/2017   Dizziness 01/20/2017   Seasonal and perennial allergic rhinitis 01/01/2017   Moderate persistent asthma, uncomplicated 31/51/7616   Pulmonary emphysema ([place]) 12/03/2016   Tobacco abuse 12/03/2016   Non-seasonal allergic rhinitis due to fungal spores 12/03/2016   Medication management 11/29/2016   Cough 11/23/2016   Rash 11/23/2016   Vertigo 10/22/2016   Gastroesophageal reflux disease with esophagitis 10/22/2016   Non compliance w medication regimen 08/01/2016   Allergic contact dermatitis due to metals 08/01/2016   Migraine without aura and without status migrainosus, not intractable 07/22/2016   Spasm of muscle of lower back 06/21/2016   Chronic rhinitis 06/19/2016   Compliance poor 06/19/2016   Gait difficulty 06/21/2015   Fibromyalgia 06/21/2015   Conversion reaction 06/21/2015   Depression 06/21/2015   Syncope and collapse 07/37/1062   [place]a syndrome of left knee 01/06/2015   Anxiety state 09/06/2014   History of MI 09/06/2014   Cigarette smoker 06/27/2013   Borderline hypertension 06/27/2013   Chronic low back pain with sciatica 06/14/2013   Leg pain, bilateral 06/14/2013   Gastroparesis 10/26/2009   Musculoskeletal chest pain 05/02/2009   Hyperlipidemia LDL goal <100 11/18/2008   COPD exacerbation ([place]) 11/18/2008   HEPATIC CYST 11/18/2008   PCP:  Biagio Borg, MD Pharmacy:   [facility], [place] [address] Phone: [phone] Fax: [phone]  [facility]  ([place], [place] [address] Phone: [phone] Fax: [place], [address] [place] [address] Phone: [phone] Fax: [phone]  [facility] [place] - [address] [place] [address] Phone: [phone] Fax: [phone]     Social Determinants of Health (SDOH) Interventions    Readmission Risk Interventions No flowsheet data found.

## 2021-03-30 NOTE — Progress Notes (Signed)
Lori [name]  FBP:[id number] DOB: [birth year] DOA: 03/28/2021 PCP: Biagio Borg, MD    Brief Narrative:  [phone] with a history of CAD, COPD, tobacco abuse, GERD, HH, depression, and HLD who presented to the ED with chest pain and shortness of breath accompanied by dizziness which had progressively been worsening over a 3-week timeframe.  Patient described the chest pain as midsternal and sharp.  She further reported severe generalized weakness making it difficult for her to ambulate.  Significant Events:  7/13 admit via ED  Consultants:  Cardiology  Code Status: FULL CODE  Antimicrobials:  None  DVT prophylaxis: Subcu heparin  Subjective: Reports feeling "terrible" today, with refractory nausea and wretching, but no vomiting. States she can not tolerate eating. No current cp or sob. No abdom pain.   Assessment & Plan:  Refractory Nausea UA at time of admission unrevealing - abdom exam benign - ?gastroenteritis - no diarrhea thus far - trial of reglan - follow trend   Chest pain no worrisome clinical symptoms suggestive of USAP -no plans for further cardiac evaluation at this time -numerous negative stress tests previously -CT coronary 2018 with no evidence of CAD - TTE this admit unrevealing   Acute kidney injury -dehydration - near syncope  Appears to be simply due to very poor intake likely related to depression -resolved with volume resuscitation  Recent Labs  Lab 03/28/21 0542 03/28/21 1430 03/29/21 0446 03/30/21 0441  CREATININE 1.99* 1.68* 1.39* 0.92    Severe B12 deficiency Likely nutritional in etiology -initiate subcu replacement during hospital stay and transition to oral replacement at discharge -will need outpatient follow-up in 8 weeks to assure that she is absorbing the dose  Folic acid deficiency Likely nutritional in etiology -dose IV x1 then initiate oral replacement daily  HLD Hold medical therapy for now with very poor intake  Tobacco  abuse Counseled on need to discontinue smoking  COPD with chronic bronchitis No acute flare at the present time  Borderline elevated blood pressure Monitor trend  Chronic anxiety  Gait instability/generalized weakness Hydrate and await PT/OT evaluations    Family Communication:  Status is: Inpatient  Remains inpatient appropriate because:Inpatient level of care appropriate due to severity of illness  Dispo: The patient is from: Home              Anticipated d/c is to:  unclear               Patient currently is not medically stable to d/c.   Difficult to place patient No  Objective: Blood pressure 135/72, pulse 66, temperature 98 F (36.7 C), resp. rate 18, height 5\' 4"  (1.626 m), weight 56.7 kg, SpO2 97 %.  Intake/Output Summary (Last 24 hours) at 03/30/2021 0958 Last data filed at 03/29/2021 1500 Gross per 24 hour  Intake 1396.54 ml  Output --  Net 1396.54 ml    Filed Weights   03/28/21 0523  Weight: 56.7 kg    Examination: General: No acute respiratory distress -frail-appearing  Lungs: Clear to auscultation bilaterally without wheezing Cardiovascular: RRR without murmur or rub Abdomen: Soft, BS positive, no rebound, no mass Extremities: No edema bilateral lower extremities  CBC: Recent Labs  Lab 03/28/21 0542 03/28/21 1430 03/29/21 0446 03/30/21 0441  WBC 8.3 7.7 6.9 7.2  NEUTROABS 5.2  --   --   --   HGB 14.4 15.2* 13.0 14.5  HCT 41.2 43.3 37.6 40.8  MCV 95.2 95.2 95.9 93.6  PLT 270 245 207 167  Basic Metabolic Panel: Recent Labs  Lab 03/28/21 0542 03/28/21 1430 03/29/21 0446 03/30/21 0441  NA 137  --  140 138  K 4.2  --  3.9 4.1  CL 106  --  108 107  CO2 22  --  25 23  GLUCOSE 101*  --  148* 105*  BUN 16  --  18 16  CREATININE 1.99* 1.68* 1.39* 0.92  CALCIUM 9.3  --  9.1 8.7*    GFR: Estimated Creatinine Clearance: 53.3 mL/min (by C-G formula based on SCr of 0.92 mg/dL).  Liver Function Tests: Recent Labs  Lab 03/30/21 0441   AST 13*  ALT 10  ALKPHOS 59  BILITOT 0.4  PROT 5.5*  ALBUMIN 3.1*    HbA1C: Hgb A1c MFr Bld  Date/Time Value Ref Range Status  11/14/2020 11:50 AM 5.2 4.6 - 6.5 % Final    Comment:    Glycemic Control Guidelines for People with Diabetes:Non Diabetic:  <6%Goal of Therapy: <7%Additional Action Suggested:  >8%   11/12/2019 11:32 AM 5.6 4.6 - 6.5 % Final    Comment:    Glycemic Control Guidelines for People with Diabetes:Non Diabetic:  <6%Goal of Therapy: <7%Additional Action Suggested:  >8%     CBG: No results for input(s): GLUCAP in the last 168 hours.  Recent Results (from the past 240 hour(s))  Resp Panel by RT-PCR (Flu A&B, Covid) Nasopharyngeal Swab     Status: None   Collection Time: 03/28/21  5:33 AM   Specimen: Nasopharyngeal Swab; Nasopharyngeal(NP) swabs in vial transport medium  Result Value Ref Range Status   SARS Coronavirus 2 by RT PCR NEGATIVE NEGATIVE Final    Comment: (NOTE) SARS-CoV-2 target nucleic acids are NOT DETECTED.  The SARS-CoV-2 RNA is generally detectable in upper respiratory specimens during the acute phase of infection. The lowest concentration of SARS-CoV-2 viral copies this assay can detect is 138 copies/mL. A negative result does not preclude SARS-Cov-2 infection and should not be used as the sole basis for treatment or other patient management decisions. A negative result may occur with  improper specimen collection/handling, submission of specimen other than nasopharyngeal swab, presence of viral mutation(s) within the areas targeted by this assay, and inadequate number of viral copies(<138 copies/mL). A negative result must be combined with clinical observations, patient history, and epidemiological information. The expected result is Negative.  Fact Sheet for Patients:  EntrepreneurPulse.com.au  Fact Sheet for Healthcare Providers:  IncredibleEmployment.be  This test is no t yet approved or  cleared by the [place] FDA and  has been authorized for detection and/or diagnosis of SARS-CoV-2 by FDA under an Emergency Use Authorization (EUA). This EUA will remain  in effect (meaning this test can be used) for the duration of the COVID-19 declaration under Section 564(b)(1) of the Act, 21 U.S.C.section 360bbb-3(b)(1), unless the authorization is terminated  or revoked sooner.       Influenza A by PCR NEGATIVE NEGATIVE Final   Influenza B by PCR NEGATIVE NEGATIVE Final    Comment: (NOTE) The Xpert Xpress SARS-CoV-2/FLU/RSV plus assay is intended as an aid in the diagnosis of influenza from Nasopharyngeal swab specimens and should not be used as a sole basis for treatment. Nasal washings and aspirates are unacceptable for Xpert Xpress SARS-CoV-2/FLU/RSV testing.  Fact Sheet for Patients: EntrepreneurPulse.com.au  Fact Sheet for Healthcare Providers: IncredibleEmployment.be  This test is not yet approved or cleared by the [place] FDA and has been authorized for detection and/or diagnosis of SARS-CoV-2 by FDA under  an Emergency Use Authorization (EUA). This EUA will remain in effect (meaning this test can be used) for the duration of the COVID-19 declaration under Section 564(b)(1) of the Act, 21 U.S.C. section 360bbb-3(b)(1), unless the authorization is terminated or revoked.  Performed at [facility], [place] [address], [place],  [address]      Scheduled Meds:  ALPRAZolam  1 mg Oral QHS   gabapentin  300 mg Oral BID   heparin  5,000 Units Subcutaneous Q8H   loratadine  10 mg Oral Daily   mometasone-formoterol  2 puff Inhalation BID   montelukast  10 mg Oral QHS   pantoprazole  40 mg Oral Daily   rosuvastatin  40 mg Oral Daily   valACYclovir  1,000 mg Oral BID   Continuous Infusions:  lactated ringers 100 mL/hr at 03/30/21 0548     LOS: 1 day   Cherene Altes, MD [facility] Office  [phone] Pager - Text Page per Shea Evans  If 7PM-7AM, please contact night-coverage per [place] 03/30/2021, 9:58 AM

## 2021-03-31 DIAGNOSIS — I208 Other forms of angina pectoris: Secondary | ICD-10-CM | POA: Diagnosis not present

## 2021-03-31 DIAGNOSIS — R55 Syncope and collapse: Secondary | ICD-10-CM | POA: Diagnosis not present

## 2021-03-31 DIAGNOSIS — R269 Unspecified abnormalities of gait and mobility: Secondary | ICD-10-CM

## 2021-03-31 DIAGNOSIS — F411 Generalized anxiety disorder: Secondary | ICD-10-CM | POA: Diagnosis not present

## 2021-03-31 DIAGNOSIS — N179 Acute kidney failure, unspecified: Secondary | ICD-10-CM | POA: Diagnosis not present

## 2021-03-31 LAB — LIPASE, BLOOD: Lipase: 23 U/L (ref 11–51)

## 2021-03-31 LAB — BASIC METABOLIC PANEL
Anion gap: 9 (ref 5–15)
BUN: 9 mg/dL (ref 8–23)
CO2: 26 mmol/L (ref 22–32)
Calcium: 8.5 mg/dL — ABNORMAL LOW (ref 8.9–10.3)
Chloride: 106 mmol/L (ref 98–111)
Creatinine, Ser: 0.89 mg/dL (ref 0.44–1.00)
GFR, Estimated: >60 mL/min (ref >60)
Glucose, Bld: 89 mg/dL (ref 70–99)
Potassium: 4.6 mmol/L (ref 3.5–5.1)
Sodium: 141 mmol/L (ref 135–145)

## 2021-03-31 LAB — AMMONIA: Ammonia: 26 umol/L (ref 9–35)

## 2021-03-31 NOTE — Progress Notes (Addendum)
Physical Therapy Treatment Patient Details Name: Lori [name] MRN: [id number] DOB: 21-Apr-1956 Today's Date: 03/31/2021    History of Present Illness Patient is 65 y.o. female presented to [facility] with c/o chest pain, weakness, dizziness. PMH significant for CAD, COPD, GERD, anxiety, depression, hiatal hernia, HLD, neck surgery.    PT Comments    Pt is progressing well with mobility, she ambulated 200' with a RW, no loss of balance. From a PT standpoint, she is ready to DC home. She has met PT goals, PT signing off.     Follow Up Recommendations  No PT follow up     Equipment Recommendations  Rolling walker with 5" wheels    Recommendations for Other Services       Precautions / Restrictions Precautions Precautions: Fall Precaution Comments: denies falls in last 6 months Restrictions Weight Bearing Restrictions: No    Mobility  Bed Mobility Overal bed mobility: Modified Independent Bed Mobility: Supine to Sit     Supine to sit: Modified independent (Device/Increase time)     General bed mobility comments: used rail    Transfers Overall transfer level: Independent Equipment used: None Transfers: Sit to/from Stand Sit to Stand: Independent         General transfer comment: steady upon standing, no loss of balance  Ambulation/Gait Ambulation/Gait assistance: Modified independent (Device/Increase time) Gait Distance (Feet): 200 Feet Assistive device: Rolling walker (2 wheeled) Gait Pattern/deviations: WFL(Within Functional Limits) Gait velocity: WNL   General Gait Details: steady, no loss of balance   Stairs             Wheelchair Mobility    Modified Rankin (Stroke Patients Only)       Balance Overall balance assessment: Modified Independent Sitting-balance support: Feet supported;No upper extremity supported Sitting balance-Leahy Scale: Good       Standing balance-Leahy Scale: Good                               Cognition Arousal/Alertness: Awake/alert Behavior During Therapy: WFL for tasks assessed/performed Overall Cognitive Status: Within Functional Limits for tasks assessed                                        Exercises      General Comments        Pertinent Vitals/Pain Pain Assessment: No/denies pain    Home Living                      Prior Function            PT Goals (current goals can now be found in the care plan section) Acute Rehab PT Goals Patient Stated Goal: get home to her dog Tiny PT Goal Formulation: With patient Time For Goal Achievement: 04/11/21 Potential to Achieve Goals: Good Progress towards PT goals: Progressing toward goals    Frequency    Min 3X/week      PT Plan Discharge plan needs to be updated    Co-evaluation              AM-PAC PT "6 Clicks" Mobility   Outcome Measure  Help needed turning from your back to your side while in a flat bed without using bedrails?: None Help needed moving from lying on your back to sitting on the side of a flat bed without using  bedrails?: A Little Help needed moving to and from a bed to a chair (including a wheelchair)?: None Help needed standing up from a chair using your arms (e.g., wheelchair or bedside chair)?: None Help needed to walk in hospital room?: None Help needed climbing 3-5 steps with a railing? : None 6 Click Score: 23    End of Session Equipment Utilized During Treatment: Gait belt Activity Tolerance: Patient tolerated treatment well Patient left: with call bell/phone within reach;in chair;with chair alarm set Nurse Communication: Mobility status PT Visit Diagnosis: Muscle weakness (generalized) (M62.81)     Time: 6056-3729 PT Time Calculation (min) (ACUTE ONLY): 17 min  Charges:  $Gait Training: 8-22 mins                     Blondell Reveal Kistler PT 03/31/2021  Acute Rehabilitation Services Pager [phone] Office [phone]

## 2021-03-31 NOTE — Progress Notes (Signed)
Lori [name]  IZT:[id number] DOB: 11-Apr-1956 DOA: 03/28/2021 PCP: Biagio Borg, MD    Brief Narrative:  [phone] with a history of CAD, COPD, tobacco abuse, GERD, HH, depression, and HLD who presented to the ED with chest pain and shortness of breath accompanied by dizziness which had progressively been worsening over a 3-week timeframe.  Patient described the chest pain as midsternal and sharp.  She further reported severe generalized weakness making it difficult for her to ambulate.  Significant Events:  7/13 admit via ED  Outpatient Follow-up Issues: -f/u of K53 and folic acid levels in 9-76 weeks on oral replacement trial   Consultants:  Cardiology  Code Status: FULL CODE  Antimicrobials:  None  DVT prophylaxis: Subcu heparin  Subjective: Afebrile.  Vital signs stable.  The patient reports she is still having significant difficulty tolerating oral intake due to persisting nausea.  Denies chest pain shortness of breath fevers or chills.  Some crampy generalized abdominal pain.  Assessment & Plan:  Refractory Nausea UA at time of admission unrevealing - abdom exam benign - ?gastroenteritis - no diarrhea thus far - trial of reglan ongoing -ammonia and lipase normal -hopeful for discharge in a.m. if oral intake can improve  Chest pain no worrisome clinical symptoms suggestive of USAP - no plans for further cardiac evaluation at this time -numerous negative stress tests previously - CT coronary 2018 with no evidence of CAD - TTE this admit unrevealing   Acute kidney injury -dehydration - near syncope  Appears to be simply due to very poor intake likely related to depression -resolved with volume resuscitation  Recent Labs  Lab 03/28/21 0542 03/28/21 1430 03/29/21 0446 03/30/21 0441 03/31/21 0536  CREATININE 1.99* 1.68* 1.39* 0.92 0.89    Severe B12 deficiency Likely nutritional in etiology - initiated subcu replacement during hospital stay and will transition to oral  replacement at discharge -will need outpatient follow-up in 8 weeks to assure that she is absorbing the dose  Folic acid deficiency Likely nutritional in etiology - dose IV x1 then initiate oral replacement daily  HLD Hold medical therapy for now with very poor intake  Tobacco abuse Counseled on need to discontinue smoking  COPD with chronic bronchitis No acute flare at the present time  Borderline elevated blood pressure Monitor trend  Chronic anxiety  Gait instability/generalized weakness OT suggested SNF placement, but pt refuses - arranging HH PT/OT/aide, and RW at time of d/c     Family Communication: No family present at time of exam Status is: Inpatient  Remains inpatient appropriate because:Inpatient level of care appropriate due to severity of illness  Dispo: The patient is from: Home              Anticipated d/c is to:  unclear               Patient currently is not medically stable to d/c.   Difficult to place patient No  Objective: Blood pressure 125/60, pulse 63, temperature 97.7 F (36.5 C), resp. rate 18, height 5\' 4"  (1.626 m), weight 56.7 kg, SpO2 97 %.  Intake/Output Summary (Last 24 hours) at 03/31/2021 1000 Last data filed at 03/31/2021 0914 Gross per 24 hour  Intake 3297.01 ml  Output --  Net 3297.01 ml    Filed Weights   03/28/21 0523  Weight: 56.7 kg    Examination: General: No acute respiratory distress  Lungs: Clear to auscultation bilaterally -no wheezing Cardiovascular: RRR -no murmur Abdomen: Soft, BS positive, no  rebound, no mass Extremities: No edema bilateral LE  CBC: Recent Labs  Lab 03/28/21 0542 03/28/21 1430 03/29/21 0446 03/30/21 0441  WBC 8.3 7.7 6.9 7.2  NEUTROABS 5.2  --   --   --   HGB 14.4 15.2* 13.0 14.5  HCT 41.2 43.3 37.6 40.8  MCV 95.2 95.2 95.9 93.6  PLT 270 245 207 932    Basic Metabolic Panel: Recent Labs  Lab 03/29/21 0446 03/30/21 0441 03/31/21 0536  NA 140 138 141  K 3.9 4.1 4.6  CL 108  107 106  CO2 25 23 26   GLUCOSE 148* 105* 89  BUN 18 16 9   CREATININE 1.39* 0.92 0.89  CALCIUM 9.1 8.7* 8.5*    GFR: Estimated Creatinine Clearance: 55.1 mL/min (by C-G formula based on SCr of 0.89 mg/dL).  Liver Function Tests: Recent Labs  Lab 03/30/21 0441  AST 13*  ALT 10  ALKPHOS 59  BILITOT 0.4  PROT 5.5*  ALBUMIN 3.1*     HbA1C: Hgb A1c MFr Bld  Date/Time Value Ref Range Status  11/14/2020 11:50 AM 5.2 4.6 - 6.5 % Final    Comment:    Glycemic Control Guidelines for People with Diabetes:Non Diabetic:  <6%Goal of Therapy: <7%Additional Action Suggested:  >8%   11/12/2019 11:32 AM 5.6 4.6 - 6.5 % Final    Comment:    Glycemic Control Guidelines for People with Diabetes:Non Diabetic:  <6%Goal of Therapy: <7%Additional Action Suggested:  >8%     Recent Results (from the past 240 hour(s))  Resp Panel by RT-PCR (Flu A&B, Covid) Nasopharyngeal Swab     Status: None   Collection Time: 03/28/21  5:33 AM   Specimen: Nasopharyngeal Swab; Nasopharyngeal(NP) swabs in vial transport medium  Result Value Ref Range Status   SARS Coronavirus 2 by RT PCR NEGATIVE NEGATIVE Final    Comment: (NOTE) SARS-CoV-2 target nucleic acids are NOT DETECTED.  The SARS-CoV-2 RNA is generally detectable in upper respiratory specimens during the acute phase of infection. The lowest concentration of SARS-CoV-2 viral copies this assay can detect is 138 copies/mL. A negative result does not preclude SARS-Cov-2 infection and should not be used as the sole basis for treatment or other patient management decisions. A negative result may occur with  improper specimen collection/handling, submission of specimen other than nasopharyngeal swab, presence of viral mutation(s) within the areas targeted by this assay, and inadequate number of viral copies(<138 copies/mL). A negative result must be combined with clinical observations, patient history, and epidemiological information. The expected result  is Negative.  Fact Sheet for Patients:  EntrepreneurPulse.com.au  Fact Sheet for Healthcare Providers:  IncredibleEmployment.be  This test is no t yet approved or cleared by the [place] FDA and  has been authorized for detection and/or diagnosis of SARS-CoV-2 by FDA under an Emergency Use Authorization (EUA). This EUA will remain  in effect (meaning this test can be used) for the duration of the COVID-19 declaration under Section 564(b)(1) of the Act, 21 U.S.C.section 360bbb-3(b)(1), unless the authorization is terminated  or revoked sooner.       Influenza A by PCR NEGATIVE NEGATIVE Final   Influenza B by PCR NEGATIVE NEGATIVE Final    Comment: (NOTE) The Xpert Xpress SARS-CoV-2/FLU/RSV plus assay is intended as an aid in the diagnosis of influenza from Nasopharyngeal swab specimens and should not be used as a sole basis for treatment. Nasal washings and aspirates are unacceptable for Xpert Xpress SARS-CoV-2/FLU/RSV testing.  Fact Sheet for Patients: EntrepreneurPulse.com.au  Fact  Sheet for Healthcare Providers: IncredibleEmployment.be  This test is not yet approved or cleared by the [place] and has been authorized for detection and/or diagnosis of SARS-CoV-2 by FDA under an Emergency Use Authorization (EUA). This EUA will remain in effect (meaning this test can be used) for the duration of the COVID-19 declaration under Section 564(b)(1) of the Act, 21 U.S.C. section 360bbb-3(b)(1), unless the authorization is terminated or revoked.  Performed at [facility], [place] [address] [place], [place] [address]      Scheduled Meds:  ALPRAZolam  1 mg Oral QHS   cyanocobalamin  1,000 mcg Subcutaneous Daily   folic acid  1 mg Oral Daily   gabapentin  300 mg Oral BID   heparin  5,000 Units Subcutaneous Q8H   loratadine  10 mg Oral Daily   metoCLOPramide (REGLAN) injection   5 mg Intravenous Q6H   mometasone-formoterol  2 puff Inhalation BID   montelukast  10 mg Oral QHS   pantoprazole  40 mg Oral Daily   rosuvastatin  40 mg Oral Daily   valACYclovir  1,000 mg Oral BID   Continuous Infusions:  lactated ringers 100 mL/hr at 03/30/21 0548     LOS: 2 days   Cherene Altes, MD [facility] Office  [phone] Pager - Text Page per Shea Evans  If 7PM-7AM, please contact night-coverage per Amion 03/31/2021, 10:00 AM

## 2021-04-01 ENCOUNTER — Inpatient Hospital Stay (HOSPITAL_COMMUNITY): Payer: Medicare Other

## 2021-04-01 DIAGNOSIS — F411 Generalized anxiety disorder: Secondary | ICD-10-CM | POA: Diagnosis not present

## 2021-04-01 DIAGNOSIS — N179 Acute kidney failure, unspecified: Secondary | ICD-10-CM | POA: Diagnosis not present

## 2021-04-01 DIAGNOSIS — R55 Syncope and collapse: Secondary | ICD-10-CM | POA: Diagnosis not present

## 2021-04-01 DIAGNOSIS — I208 Other forms of angina pectoris: Secondary | ICD-10-CM | POA: Diagnosis not present

## 2021-04-01 MED ORDER — BUTALBITAL-APAP-CAFFEINE 50-325-40 MG PO TABS: 1.0000 | ORAL_TABLET | ORAL | Status: DC | PRN

## 2021-04-01 MED ORDER — ALPRAZOLAM 0.5 MG PO TABS
0.5000 mg | ORAL_TABLET | Freq: Two times a day (BID) | ORAL | Status: DC | PRN
  Administered 2021-04-01 – 2021-04-02 (×2): 0.5 mg via ORAL
  Filled 2021-04-01 (×2): qty 1

## 2021-04-01 MED ORDER — ACETAMINOPHEN 325 MG PO TABS: 650.0000 mg | ORAL_TABLET | Freq: Four times a day (QID) | ORAL | Status: DC | PRN

## 2021-04-01 NOTE — Progress Notes (Signed)
Lori [name]  FOY:[id number] DOB: 20-Sep-1955 DOA: 03/28/2021 PCP: Biagio Borg, MD    Brief Narrative:  [phone] with a history of CAD, COPD, tobacco abuse, GERD, HH, depression, and HLD who presented to the ED with chest pain and shortness of breath accompanied by dizziness which had progressively been worsening over a 3-week timeframe.  Patient described the chest pain as midsternal and sharp.  She further reported severe generalized weakness making it difficult for her to ambulate.  Significant Events:  7/13 admit via ED  Outpatient Follow-up Issues: -f/u of C94 and folic acid levels in 7-09 weeks on oral replacement trial   Consultants:  Cardiology  Code Status: FULL CODE  Antimicrobials:  None  DVT prophylaxis: Subcu heparin  Subjective: Continues to report very poor tolerance of oral intake with nausea.  States she feels weak in general.  Tells me she absolutely will not consider placement within a skilled nursing facility for rehab.  Assessment & Plan:  Refractory Nausea UA at time of admission unrevealing - abdom exam benign - no diarrhea - trial of reglan had no effect -ammonia and lipase normal - check KUB to r/o obstructive bowel gas pattern  Chest pain no worrisome clinical symptoms suggestive of USAP - no plans for further cardiac evaluation at this time -numerous negative stress tests previously - CT coronary 2018 with no evidence of CAD - TTE this admit unrevealing   Acute kidney injury -dehydration - near syncope  Appears to have been simply due to very poor intake likely related to depression - resolved with volume resuscitation  Recent Labs  Lab 03/28/21 0542 03/28/21 1430 03/29/21 0446 03/30/21 0441 03/31/21 0536  CREATININE 1.99* 1.68* 1.39* 0.92 0.89    Severe B12 deficiency Likely nutritional in etiology - initiated subcu replacement during hospital stay and will transition to oral replacement at discharge -will need outpatient follow-up in 8  weeks to assure that she is absorbing the dose  Folic acid deficiency Likely nutritional in etiology - dose IV x1 then initiate oral replacement daily  HLD Hold medical therapy for now with very poor intake  Tobacco abuse Counseled on need to discontinue smoking  COPD with chronic bronchitis No acute flare at the present time  Borderline elevated blood pressure Monitor trend  Chronic anxiety Cont usual home xanax dose   Gait instability/generalized weakness OT suggested SNF placement, but pt adamantly refuses - arranging HH PT/OT/aide, and RW at time of d/c     Family Communication: No family present at time of exam Status is: Inpatient  Remains inpatient appropriate because:Inpatient level of care appropriate due to severity of illness  Dispo: The patient is from: Home              Anticipated d/c is to:  unclear               Patient currently is not medically stable to d/c.   Difficult to place patient No  Objective: Blood pressure 119/86, pulse 76, temperature 98 F (36.7 C), resp. rate 18, height 5\' 4"  (1.626 m), weight 56.7 kg, SpO2 94 %.  Intake/Output Summary (Last 24 hours) at 04/01/2021 1104 Last data filed at 04/01/2021 0755 Gross per 24 hour  Intake 1190 ml  Output --  Net 1190 ml    Filed Weights   03/28/21 0523  Weight: 56.7 kg    Examination: General: No acute respiratory distress  Lungs: Clear to auscultation bilaterally without wheezing or focal crackles Cardiovascular: RRR -no murmur  Abdomen: Soft, BS positive, no rebound, no mass Extremities: No edema B LE  CBC: Recent Labs  Lab 03/28/21 0542 03/28/21 1430 03/29/21 0446 03/30/21 0441  WBC 8.3 7.7 6.9 7.2  NEUTROABS 5.2  --   --   --   HGB 14.4 15.2* 13.0 14.5  HCT 41.2 43.3 37.6 40.8  MCV 95.2 95.2 95.9 93.6  PLT 270 245 207 330    Basic Metabolic Panel: Recent Labs  Lab 03/29/21 0446 03/30/21 0441 03/31/21 0536  NA 140 138 141  K 3.9 4.1 4.6  CL 108 107 106  CO2 25  23 26   GLUCOSE 148* 105* 89  BUN 18 16 9   CREATININE 1.39* 0.92 0.89  CALCIUM 9.1 8.7* 8.5*    GFR: Estimated Creatinine Clearance: 55.1 mL/min (by C-G formula based on SCr of 0.89 mg/dL).  Liver Function Tests: Recent Labs  Lab 03/30/21 0441  AST 13*  ALT 10  ALKPHOS 59  BILITOT 0.4  PROT 5.5*  ALBUMIN 3.1*     HbA1C: Hgb A1c MFr Bld  Date/Time Value Ref Range Status  11/14/2020 11:50 AM 5.2 4.6 - 6.5 % Final    Comment:    Glycemic Control Guidelines for People with Diabetes:Non Diabetic:  <6%Goal of Therapy: <7%Additional Action Suggested:  >8%   11/12/2019 11:32 AM 5.6 4.6 - 6.5 % Final    Comment:    Glycemic Control Guidelines for People with Diabetes:Non Diabetic:  <6%Goal of Therapy: <7%Additional Action Suggested:  >8%     Recent Results (from the past 240 hour(s))  Resp Panel by RT-PCR (Flu A&B, Covid) Nasopharyngeal Swab     Status: None   Collection Time: 03/28/21  5:33 AM   Specimen: Nasopharyngeal Swab; Nasopharyngeal(NP) swabs in vial transport medium  Result Value Ref Range Status   SARS Coronavirus 2 by RT PCR NEGATIVE NEGATIVE Final    Comment: (NOTE) SARS-CoV-2 target nucleic acids are NOT DETECTED.  The SARS-CoV-2 RNA is generally detectable in upper respiratory specimens during the acute phase of infection. The lowest concentration of SARS-CoV-2 viral copies this assay can detect is 138 copies/mL. A negative result does not preclude SARS-Cov-2 infection and should not be used as the sole basis for treatment or other patient management decisions. A negative result may occur with  improper specimen collection/handling, submission of specimen other than nasopharyngeal swab, presence of viral mutation(s) within the areas targeted by this assay, and inadequate number of viral copies(<138 copies/mL). A negative result must be combined with clinical observations, patient history, and epidemiological information. The expected result is  Negative.  Fact Sheet for Patients:  EntrepreneurPulse.com.au  Fact Sheet for Healthcare Providers:  IncredibleEmployment.be  This test is no t yet approved or cleared by the [place] FDA and  has been authorized for detection and/or diagnosis of SARS-CoV-2 by FDA under an Emergency Use Authorization (EUA). This EUA will remain  in effect (meaning this test can be used) for the duration of the COVID-19 declaration under Section 564(b)(1) of the Act, 21 U.S.C.section 360bbb-3(b)(1), unless the authorization is terminated  or revoked sooner.       Influenza A by PCR NEGATIVE NEGATIVE Final   Influenza B by PCR NEGATIVE NEGATIVE Final    Comment: (NOTE) The Xpert Xpress SARS-CoV-2/FLU/RSV plus assay is intended as an aid in the diagnosis of influenza from Nasopharyngeal swab specimens and should not be used as a sole basis for treatment. Nasal washings and aspirates are unacceptable for Xpert Xpress SARS-CoV-2/FLU/RSV testing.  Fact Sheet  for Patients: EntrepreneurPulse.com.au  Fact Sheet for Healthcare Providers: IncredibleEmployment.be  This test is not yet approved or cleared by the [place] FDA and has been authorized for detection and/or diagnosis of SARS-CoV-2 by FDA under an Emergency Use Authorization (EUA). This EUA will remain in effect (meaning this test can be used) for the duration of the COVID-19 declaration under Section 564(b)(1) of the Act, 21 U.S.C. section 360bbb-3(b)(1), unless the authorization is terminated or revoked.  Performed at [facility], [place] [address] [place], [place] [address]      Scheduled Meds:  ALPRAZolam  1 mg Oral QHS   cyanocobalamin  1,000 mcg Subcutaneous Daily   folic acid  1 mg Oral Daily   gabapentin  300 mg Oral BID   heparin  5,000 Units Subcutaneous Q8H   loratadine  10 mg Oral Daily   mometasone-formoterol  2 puff  Inhalation BID   montelukast  10 mg Oral QHS   pantoprazole  40 mg Oral Daily   rosuvastatin  40 mg Oral Daily   valACYclovir  1,000 mg Oral BID   Continuous Infusions:  lactated ringers 50 mL/hr at 03/31/21 1451     LOS: 3 days   Cherene Altes, MD [facility] Office  [phone] Pager - Text Page per Shea Evans  If 7PM-7AM, please contact night-coverage per Amion 04/01/2021, 11:04 AM

## 2021-04-02 DIAGNOSIS — R42 Dizziness and giddiness: Secondary | ICD-10-CM | POA: Diagnosis not present

## 2021-04-02 DIAGNOSIS — R112 Nausea with vomiting, unspecified: Secondary | ICD-10-CM

## 2021-04-02 DIAGNOSIS — F411 Generalized anxiety disorder: Secondary | ICD-10-CM | POA: Diagnosis not present

## 2021-04-02 DIAGNOSIS — R269 Unspecified abnormalities of gait and mobility: Secondary | ICD-10-CM | POA: Diagnosis not present

## 2021-04-02 DIAGNOSIS — N179 Acute kidney failure, unspecified: Secondary | ICD-10-CM | POA: Diagnosis not present

## 2021-04-02 LAB — COMPREHENSIVE METABOLIC PANEL
ALT: 20 U/L (ref 0–44)
AST: 24 U/L (ref 15–41)
Albumin: 4.2 g/dL (ref 3.5–5.0)
Alkaline Phosphatase: 66 U/L (ref 38–126)
Anion gap: 8 (ref 5–15)
BUN: 14 mg/dL (ref 8–23)
CO2: 27 mmol/L (ref 22–32)
Calcium: 8.9 mg/dL (ref 8.9–10.3)
Chloride: 104 mmol/L (ref 98–111)
Creatinine, Ser: 1.22 mg/dL — ABNORMAL HIGH (ref 0.44–1.00)
GFR, Estimated: 50 mL/min — ABNORMAL LOW (ref >60)
Glucose, Bld: 127 mg/dL — ABNORMAL HIGH (ref 70–99)
Potassium: 4.5 mmol/L (ref 3.5–5.1)
Sodium: 139 mmol/L (ref 135–145)
Total Bilirubin: 0.7 mg/dL (ref 0.3–1.2)
Total Protein: 6.5 g/dL (ref 6.5–8.1)

## 2021-04-02 LAB — CBC
HCT: 37.1 % (ref 36.0–46.0)
Hemoglobin: 12.6 g/dL (ref 12.0–15.0)
MCH: 34.1 pg — ABNORMAL HIGH (ref 26.0–34.0)
MCHC: 34 g/dL (ref 30.0–36.0)
MCV: 100.3 fL — ABNORMAL HIGH (ref 80.0–100.0)
Platelets: 205 10*3/uL (ref 150–400)
RBC: 3.7 MIL/uL — ABNORMAL LOW (ref 3.87–5.11)
RDW: 13.3 % (ref 11.5–15.5)
WBC: 8 10*3/uL (ref 4.0–10.5)
nRBC: 0 % (ref 0.0–0.2)

## 2021-04-02 LAB — MAGNESIUM: Magnesium: 2.1 mg/dL (ref 1.7–2.4)

## 2021-04-02 MED ORDER — ACETAMINOPHEN 325 MG PO TABS: 650.0000 mg | ORAL_TABLET | Freq: Four times a day (QID) | ORAL | Status: DC | PRN

## 2021-04-02 MED ORDER — FOLIC ACID 1 MG PO TABS
1.0000 mg | ORAL_TABLET | Freq: Every day | ORAL | Status: DC
Start: 2021-04-03 — End: 2021-12-04

## 2021-04-02 MED ORDER — CYANOCOBALAMIN 1000 MCG PO TABS: 1000.0000 ug | ORAL_TABLET | Freq: Every day | ORAL | Status: DC

## 2021-04-02 MED ORDER — VITAMIN B-12 1000 MCG PO TABS: 1000.0000 ug | ORAL_TABLET | Freq: Every day | ORAL | Status: DC

## 2021-04-02 NOTE — Discharge Summary (Signed)
DISCHARGE SUMMARY  Lori [name]  MR#: [id number]  DOB:1955/11/03  Date of Admission: 03/28/2021 Date of Discharge: 04/02/2021  Attending Physician:Caven Perine Hennie Duos, MD  Patient's TDS:KAJG, Hunt Oris, MD  Consults: Cardiology    Disposition: D/C home    Follow-up Appts:  Follow-up Information     Marilynn Rail Jossie Ng, NP Follow up.   Specialty: Cardiology Why: [place] location - a cardiology follow-up has been arranged for you on Thursday September 1 at 10:45 AM (Arrive by 10:30 AM). Denyse Amass is one of our nurse practitioners with Dr. Allison Quarry office. Contact information: [address] [place] [address] [place], [facility] Follow up.   Specialty: [place] Why: This si your agency for physical therapy, hygiene aide Contact information: [place] [address] [phone]         Biagio Borg, MD Follow up in 1 week(s).   Specialties: Internal Medicine, Radiology Contact information: [place] [address] [phone]                 Tests Needing Follow-up: -f/u of O03 and folic acid levels in 2-12 weeks on oral replacement trial  Discharge Diagnoses: Refractory Nausea Chest pain Acute kidney injury -dehydration - near syncope Severe Y48 deficiency Folic acid deficiency HLD Tobacco abuse COPD with chronic bronchitis Borderline elevated blood pressure Chronic anxiety Gait instability/generalized weakness  Initial presentation: 64yo with a history of CAD, COPD, tobacco abuse, GERD, HH, depression, and HLD who presented to the ED with chest pain and shortness of breath accompanied by dizziness which had progressively been worsening over a 3-week timeframe.  Patient described the chest pain as midsternal and sharp.  She further reported severe generalized weakness making it difficult for her to ambulate.  Hospital  Course:  Refractory Nausea UA at time of admission unrevealing - abdom exam benign - KUB unrevealing - no diarrhea - trial of reglan had no effect -ammonia and lipase normal - able to tolerate oral intake at time of d/c - etiology unclear but appears to have signif improved    Chest pain no worrisome clinical symptoms suggestive of USAP - Cardiology evaluated w/ no plans for further cardiac evaluation -numerous negative stress tests previously - CT coronary 2018 with no evidence of CAD - TTE this admit unrevealing   Acute kidney injury -dehydration - near syncope Appears to have been simply due to very poor intake likely related to depression - resolved with volume resuscitation - encouraged consistent hydration at home   Severe B12 deficiency Likely nutritional in etiology - initiated subcu replacement during hospital stay and transitioned to oral replacement at discharge -will need outpatient follow-up in 8 weeks to assure that she is absorbing the dose   Folic acid deficiency Likely nutritional in etiology - dose IV x1 then initiate oral replacement daily   HLD Hold medical therapy for now with very poor intake   Tobacco abuse Counseled on need to discontinue smoking   COPD with chronic bronchitis No acute flare at the present time   Borderline elevated blood pressure Monitor trend   Chronic anxiety Cont usual home xanax dose    Gait instability/generalized weakness OT suggested SNF placement, but pt adamantly refuses - arranging HH PT/OT/aide, and RW at time of d/c  Allergies as of 04/02/2021       Reactions   Bee Venom Swelling   Codeine Nausea  Only, Other (See Comments)   CAUSES ULCERS   Ibuprofen Nausea And Vomiting   Clonidine Derivatives Other (See Comments)   Unknown    Statins    Tramadol Nausea Only   Latex Rash   Levofloxacin Nausea Only   Propoxyphene N-acetaminophen Nausea Only        Medication List     STOP taking these medications     amoxicillin-clavulanate 875-125 MG tablet Commonly known as: AUGMENTIN   benzonatate 200 MG capsule Commonly known as: TESSALON   cyclobenzaprine 5 MG tablet Commonly known as: FLEXERIL   guaiFENesin-dextromethorphan 100-10 MG/5ML syrup Commonly known as: ROBITUSSIN DM       TAKE these medications    acetaminophen 325 MG tablet Commonly known as: TYLENOL Take 2 tablets (650 mg total) by mouth every 6 (six) hours as needed for mild pain or fever.   Aimovig 140 MG/ML Soaj Generic drug: Erenumab-aooe Inject 140 mg into the skin every 30 (thirty) days.   ALPRAZolam 1 MG tablet Commonly known as: XANAX TAKE 1 TABLET BY MOUTH AT BEDTIME AS NEEDED FOR ANXIETY What changed: See the new instructions.   cetirizine 10 MG tablet Commonly known as: ZYRTEC Take 1 tablet (10 mg total) by mouth daily.   cyanocobalamin 1000 MCG tablet Take 1 tablet (1,000 mcg total) by mouth daily. Start taking on: April 03, 2021   Dexilant 60 MG capsule Generic drug: dexlansoprazole Take 1 capsule by mouth once daily   Dulera 200-5 MCG/ACT Aero Generic drug: mometasone-formoterol Inhale 2 puffs into the lungs 2 (two) times daily.   EPINEPHrine 0.3 mg/0.3 mL Soaj injection Commonly known as: EPI-PEN INJECT AS DIRECTED FOR SEVERE ALLERGIC REACTION   Fasenra 30 MG/ML Sosy Generic drug: Benralizumab Inject into the skin. Every 8 weeks   fluticasone-salmeterol 230-21 MCG/ACT inhaler Commonly known as: ADVAIR HFA Inhale 2 puffs into the lungs 2 (two) times daily.   folic acid 1 MG tablet Commonly known as: FOLVITE Take 1 tablet (1 mg total) by mouth daily. Start taking on: April 03, 2021   gabapentin 300 MG capsule Commonly known as: NEURONTIN Take 300 mg by mouth 2 (two) times daily as needed.   levalbuterol 45 MCG/ACT inhaler Commonly known as: Xopenex HFA Inhale 2 puffs into the lungs as directed.   meclizine 25 MG tablet Commonly known as: ANTIVERT Take 1 tablet (25 mg total)  by mouth 3 (three) times daily as needed for dizziness.   montelukast 10 MG tablet Commonly known as: SINGULAIR Take 1 tablet (10 mg total) by mouth at bedtime.   Narcan 4 MG/0.1ML Liqd nasal spray kit Generic drug: naloxone   nitroGLYCERIN 0.4 MG SL tablet Commonly known as: NITROSTAT DISSOLVE ONE TABLET UNDER THE TONGUE EVERY 5 MINUTES AS NEEDED FOR CHEST PAIN.  DO NOT EXCEED A TOTAL OF 3 DOSES IN 15 MINUTES   rosuvastatin 40 MG tablet Commonly known as: Crestor Take 1 tablet (40 mg total) by mouth daily.   triamcinolone 55 MCG/ACT Aero nasal inhaler Commonly known as: NASACORT Place 2 sprays into the nose daily.   valACYclovir 1000 MG tablet Commonly known as: VALTREX Take 1 tablet by mouth twice daily               Durable Medical Equipment  (From admission, onward)           Start     Ordered   03/31/21 1006  For home use only DME Walker rolling  Once       Question  Answer Comment  Walker: With 5 Inch Wheels   Patient needs a walker to treat with the following condition Severe muscle deconditioning      03/31/21 1005            Day of Discharge BP 135/86 (BP Location: Left Arm)   Pulse 68   Temp 97.7 F (36.5 C) (Oral)   Resp 16   Ht '5\' 4"'  (1.626 m)   Wt 56.7 kg   SpO2 94%   BMI 21.46 kg/m   Physical Exam: General: No acute respiratory distress Lungs: Clear to auscultation bilaterally without wheezes or crackles Cardiovascular: Regular rate and rhythm without murmur gallop or rub normal S1 and S2 Abdomen: Nontender, nondistended, soft, bowel sounds positive, no rebound, no ascites, no appreciable mass Extremities: No significant cyanosis, clubbing, or edema bilateral lower extremities  Basic Metabolic Panel: Recent Labs  Lab 03/28/21 0542 03/28/21 1430 03/29/21 0446 03/30/21 0441 03/31/21 0536 04/02/21 0450  NA 137  --  140 138 141 139  K 4.2  --  3.9 4.1 4.6 4.5  CL 106  --  108 107 106 104  CO2 22  --  '25 23 26 27  ' GLUCOSE  101*  --  148* 105* 89 127*  BUN 16  --  '18 16 9 14  ' CREATININE 1.99* 1.68* 1.39* 0.92 0.89 1.22*  CALCIUM 9.3  --  9.1 8.7* 8.5* 8.9  MG  --   --   --   --   --  2.1    Liver Function Tests: Recent Labs  Lab 03/30/21 0441 04/02/21 0450  AST 13* 24  ALT 10 20  ALKPHOS 59 66  BILITOT 0.4 0.7  PROT 5.5* 6.5  ALBUMIN 3.1* 4.2   Recent Labs  Lab 03/31/21 0536  LIPASE 23   Recent Labs  Lab 03/31/21 0536  AMMONIA 26    CBC: Recent Labs  Lab 03/28/21 0542 03/28/21 1430 03/29/21 0446 03/30/21 0441 04/02/21 0450  WBC 8.3 7.7 6.9 7.2 8.0  NEUTROABS 5.2  --   --   --   --   HGB 14.4 15.2* 13.0 14.5 12.6  HCT 41.2 43.3 37.6 40.8 37.1  MCV 95.2 95.2 95.9 93.6 100.3*  PLT 270 245 207 167 205     Recent Results (from the past 240 hour(s))  Resp Panel by RT-PCR (Flu A&B, Covid) Nasopharyngeal Swab     Status: None   Collection Time: 03/28/21  5:33 AM   Specimen: Nasopharyngeal Swab; Nasopharyngeal(NP) swabs in vial transport medium  Result Value Ref Range Status   SARS Coronavirus 2 by RT PCR NEGATIVE NEGATIVE Final    Comment: (NOTE) SARS-CoV-2 target nucleic acids are NOT DETECTED.  The SARS-CoV-2 RNA is generally detectable in upper respiratory specimens during the acute phase of infection. The lowest concentration of SARS-CoV-2 viral copies this assay can detect is 138 copies/mL. A negative result does not preclude SARS-Cov-2 infection and should not be used as the sole basis for treatment or other patient management decisions. A negative result may occur with  improper specimen collection/handling, submission of specimen other than nasopharyngeal swab, presence of viral mutation(s) within the areas targeted by this assay, and inadequate number of viral copies(<138 copies/mL). A negative result must be combined with clinical observations, patient history, and epidemiological information. The expected result is Negative.  Fact Sheet for Patients:   EntrepreneurPulse.com.au  Fact Sheet for Healthcare Providers:  IncredibleEmployment.be  This test is no t yet approved or cleared by the  [place] Architectural technologist and  has been authorized for detection and/or diagnosis of SARS-CoV-2 by FDA under an Print production planner (EUA). This EUA will remain  in effect (meaning this test can be used) for the duration of the COVID-19 declaration under Section 564(b)(1) of the Act, 21 U.S.C.section 360bbb-3(b)(1), unless the authorization is terminated  or revoked sooner.       Influenza A by PCR NEGATIVE NEGATIVE Final   Influenza B by PCR NEGATIVE NEGATIVE Final    Comment: (NOTE) The Xpert Xpress SARS-CoV-2/FLU/RSV plus assay is intended as an aid in the diagnosis of influenza from Nasopharyngeal swab specimens and should not be used as a sole basis for treatment. Nasal washings and aspirates are unacceptable for Xpert Xpress SARS-CoV-2/FLU/RSV testing.  Fact Sheet for Patients: EntrepreneurPulse.com.au  Fact Sheet for Healthcare Providers: IncredibleEmployment.be  This test is not yet approved or cleared by the [place] FDA and has been authorized for detection and/or diagnosis of SARS-CoV-2 by FDA under an Emergency Use Authorization (EUA). This EUA will remain in effect (meaning this test can be used) for the duration of the COVID-19 declaration under Section 564(b)(1) of the Act, 21 U.S.C. section 360bbb-3(b)(1), unless the authorization is terminated or revoked.  Performed at [facility], [place] [address] [place], [place] [address]       Time spent in discharge (includes decision making & examination of pt): 35 minutes  04/02/2021, 10:14 AM   Cherene Altes, MD [facility] Office  [phone]

## 2021-04-02 NOTE — Care Management Important Message (Signed)
Important Message  Patient Details IM Letter given to the Patient. Name: Lori [name] MRN: [id number] Date of Birth: [birth year]   Medicare Important Message Given:  Yes     [name], [name] 04/02/2021, 10:30 AM

## 2021-04-04 ENCOUNTER — Ambulatory Visit (INDEPENDENT_AMBULATORY_CARE_PROVIDER_SITE_OTHER): Payer: Medicare Other

## 2021-04-04 ENCOUNTER — Other Ambulatory Visit: Payer: Self-pay

## 2021-04-04 DIAGNOSIS — J4550 Severe persistent asthma, uncomplicated: Secondary | ICD-10-CM

## 2021-04-11 ENCOUNTER — Telehealth: Payer: Self-pay | Admitting: Internal Medicine

## 2021-04-11 ENCOUNTER — Ambulatory Visit (INDEPENDENT_AMBULATORY_CARE_PROVIDER_SITE_OTHER): Payer: Medicare Other | Admitting: Internal Medicine

## 2021-04-11 ENCOUNTER — Ambulatory Visit (INDEPENDENT_AMBULATORY_CARE_PROVIDER_SITE_OTHER): Payer: Medicare Other | Admitting: *Deleted

## 2021-04-11 ENCOUNTER — Other Ambulatory Visit: Payer: Self-pay

## 2021-04-11 ENCOUNTER — Other Ambulatory Visit: Payer: Self-pay | Admitting: Internal Medicine

## 2021-04-11 ENCOUNTER — Ambulatory Visit (INDEPENDENT_AMBULATORY_CARE_PROVIDER_SITE_OTHER): Payer: Medicare Other

## 2021-04-11 ENCOUNTER — Encounter: Payer: Self-pay | Admitting: Internal Medicine

## 2021-04-11 VITALS — BP 128/78 | HR 78 | Temp 98.4°F | Ht 64.0 in | Wt 127.0 lb

## 2021-04-11 DIAGNOSIS — R739 Hyperglycemia, unspecified: Secondary | ICD-10-CM

## 2021-04-11 DIAGNOSIS — M25552 Pain in left hip: Secondary | ICD-10-CM

## 2021-04-11 DIAGNOSIS — N179 Acute kidney failure, unspecified: Secondary | ICD-10-CM

## 2021-04-11 DIAGNOSIS — M545 Low back pain, unspecified: Secondary | ICD-10-CM | POA: Diagnosis not present

## 2021-04-11 DIAGNOSIS — F17210 Nicotine dependence, cigarettes, uncomplicated: Secondary | ICD-10-CM | POA: Diagnosis not present

## 2021-04-11 DIAGNOSIS — J449 Chronic obstructive pulmonary disease, unspecified: Secondary | ICD-10-CM | POA: Diagnosis not present

## 2021-04-11 DIAGNOSIS — E538 Deficiency of other specified B group vitamins: Secondary | ICD-10-CM

## 2021-04-11 DIAGNOSIS — M25551 Pain in right hip: Secondary | ICD-10-CM

## 2021-04-11 DIAGNOSIS — M542 Cervicalgia: Secondary | ICD-10-CM | POA: Diagnosis not present

## 2021-04-11 DIAGNOSIS — E559 Vitamin D deficiency, unspecified: Secondary | ICD-10-CM

## 2021-04-11 DIAGNOSIS — G43719 Chronic migraine without aura, intractable, without status migrainosus: Secondary | ICD-10-CM | POA: Diagnosis not present

## 2021-04-11 DIAGNOSIS — M546 Pain in thoracic spine: Secondary | ICD-10-CM | POA: Diagnosis not present

## 2021-04-11 DIAGNOSIS — J309 Allergic rhinitis, unspecified: Secondary | ICD-10-CM

## 2021-04-11 LAB — BASIC METABOLIC PANEL
BUN: 13 mg/dL (ref 6–23)
CO2: 22 mEq/L (ref 19–32)
Calcium: 9 mg/dL (ref 8.4–10.5)
Chloride: 107 mEq/L (ref 96–112)
Creatinine, Ser: 0.89 mg/dL (ref 0.40–1.20)
GFR: 68.4 mL/min (ref >60.00)
Glucose, Bld: 76 mg/dL (ref 70–99)
Potassium: 3.3 mEq/L — ABNORMAL LOW (ref 3.5–5.1)
Sodium: 140 mEq/L (ref 135–145)

## 2021-04-11 MED ORDER — KETOROLAC TROMETHAMINE 30 MG/ML IJ SOLN
30.0000 mg | Freq: Once | INTRAMUSCULAR | Status: AC
  Administered 2021-04-11: 30 mg via INTRAMUSCULAR

## 2021-04-11 MED ORDER — KETOROLAC TROMETHAMINE 30 MG/ML IJ SOLN: 30.0000 mg | Freq: Once | INTRAMUSCULAR | Status: DC

## 2021-04-11 MED ORDER — HYDROCODONE-ACETAMINOPHEN 5-325 MG PO TABS: 1.0000 | ORAL_TABLET | Freq: Four times a day (QID) | ORAL | 0 refills | Status: DC | PRN

## 2021-04-11 MED ORDER — POTASSIUM CHLORIDE ER 10 MEQ PO TBCR: 10.0000 meq | EXTENDED_RELEASE_TABLET | Freq: Every day | ORAL | 0 refills | Status: DC

## 2021-04-11 MED ORDER — CYANOCOBALAMIN 1000 MCG PO TABS: 1000.0000 ug | ORAL_TABLET | Freq: Every day | ORAL | 3 refills | Status: DC

## 2021-04-11 NOTE — Progress Notes (Signed)
Chief Complaint: follow up post hospn July 13-july 18       HPI:  Lori [name] is a 65 y.o. female here with above, chest pain and shortness of breath accompanied by dizziness which had progressively been worsening over a 3-week timeframe.  Patient described the chest pain as midsternal and sharp.  She further reported severe generalized weakness making it difficult for her to ambulate, as well as nausea persistent.  Found to have AKI and low volume as cause for near syncope.  Seen per cardiology with no plane for f/u.  Tx with IVF and renal funciton improved,  B12 tx started for severe low felt likely due to nutrition deficit.  Also low folic acid.  Since d/c Pt denies chest pain, increased sob or doe, wheezing, orthopnea, PND, increased LE swelling, palpitations, dizziness or syncope.   Pt denies polydipsia, polyuria, or new focal neuro s/s  Still with weakness, nausea and did have a fall post dc x 2 days ago with pain to whole spine but worse to upper lumbar and lower thoracic, today 8/10, very tender to palpate midline.  Still smoking, not ready to quit.  Denies worsening reflux, abd pain, dysphagia, bowel change or blood.  Needs order for [facility] with PT per pt.       Wt Readings from Last 3 Encounters:  04/11/21 127 lb (57.6 kg)  03/28/21 125 lb (56.7 kg)  01/12/21 130 lb (59 kg)   BP Readings from Last 3 Encounters:  04/11/21 128/78  04/02/21 135/86  01/12/21 120/78         Past Medical History:  Diagnosis Date   Acute MI ([place])    x3 - by report only. She has had 3 negative Myoview stress test with no evidence of prior infarct.   Allergic rhinitis    Allergy    Anemia    Ankle fracture 05/2016   Anxiety    Arthritis    Asthma    Barrett esophagus 2007   Chest pain 05-02-2009   echo  EF 55%   COPD (chronic obstructive pulmonary disease) with chronic bronchitis (HCC)    & Emphysema   Depression    Duodenitis without mention of hemorrhage 2007   Esophageal reflux 2007    Esophageal stricture    Full dentures    H/O hiatal hernia    Hiatal hernia 2006,2007   Hyperlipidemia    Multiple fractures    from falls, fx rt. elbow, fx left wrist, bilateral ankles   Pneumonia 10/2016   Restrictive lung disease 06/30/2017   Ulcer    Past Surgical History:  Procedure Laterality Date   ABDOMINAL HYSTERECTOMY     BSO   APPENDECTOMY     CESAREAN SECTION     x 2   CHOLECYSTECTOMY     CHONDROPLASTY Left 01/06/2015   Procedure: CHONDROPLASTY;  Surgeon: Marybelle Killings, MD;  Location: [place];  Service: Orthopedics;  Laterality: Left;   COLONOSCOPY     ELBOW FRACTURE SURGERY     right   KNEE ARTHROSCOPY WITH EXCISION PLICA Left 3/90/3009   Procedure: KNEE ARTHROSCOPY WITH EXCISION PLICA;  Surgeon: Marybelle Killings, MD;  Location: [place];  Service: Orthopedics;  Laterality: Left;   Lower Extremity Arterial Dopplers  07/06/2013   RABI 1.0, LABI 1.1.; No evidence of significant vascular atherogenic plaque   NECK SURGERY     NM MYOVIEW LTD  09/2014   LOW RISK.  Normal EF of 65% with no regional wall motion abnormalities. No ischemia or infarction.   TONSILLECTOMY     WRIST FRACTURE SURGERY     left, has plate    reports that she has been smoking cigarettes. She started smoking about 43 years ago. She has a 19.50 pack-year smoking history. She has never used smokeless tobacco. She reports that she does not drink alcohol and does not use drugs. family history includes Dementia in her mother; Diabetes in her maternal aunt, maternal grandmother, and another family member; Emphysema in her father; Heart disease in her brother. Allergies  Allergen Reactions   Bee Venom Swelling   Codeine Nausea Only and Other (See Comments)    CAUSES ULCERS   Ibuprofen Nausea And Vomiting   Clonidine Derivatives Other (See Comments)    Unknown    Statins    Tramadol Nausea Only   Latex Rash   Levofloxacin Nausea Only   Propoxyphene N-Acetaminophen  Nausea Only   Current Outpatient Medications on File Prior to Visit  Medication Sig Dispense Refill   acetaminophen (TYLENOL) 325 MG tablet Take 2 tablets (650 mg total) by mouth every 6 (six) hours as needed for mild pain or fever.     AIMOVIG 140 MG/ML SOAJ Inject 140 mg into the skin every 30 (thirty) days.     ALPRAZolam (XANAX) 1 MG tablet TAKE 1 TABLET BY MOUTH AT BEDTIME AS NEEDED FOR ANXIETY 90 tablet 1   cetirizine (ZYRTEC) 10 MG tablet Take 1 tablet (10 mg total) by mouth daily. 31 tablet 5   DEXILANT 60 MG capsule Take 1 capsule by mouth once daily 90 capsule 3   EPINEPHrine 0.3 mg/0.3 mL IJ SOAJ injection INJECT AS DIRECTED FOR SEVERE ALLERGIC REACTION 2 each 1   FASENRA 30 MG/ML SOSY Inject into the skin. Every 8 weeks     fluticasone-salmeterol (ADVAIR HFA) 230-21 MCG/ACT inhaler Inhale 2 puffs into the lungs 2 (two) times daily. 12 g 5   folic acid (FOLVITE) 1 MG tablet Take 1 tablet (1 mg total) by mouth daily.     gabapentin (NEURONTIN) 300 MG capsule Take 300 mg by mouth 2 (two) times daily as needed.     levalbuterol (XOPENEX HFA) 45 MCG/ACT inhaler Inhale 2 puffs into the lungs as directed. 15 g 2   meclizine (ANTIVERT) 25 MG tablet Take 1 tablet (25 mg total) by mouth 3 (three) times daily as needed for dizziness. 40 tablet 2   mometasone-formoterol (DULERA) 200-5 MCG/ACT AERO Inhale 2 puffs into the lungs 2 (two) times daily. 1 each 5   montelukast (SINGULAIR) 10 MG tablet Take 1 tablet (10 mg total) by mouth at bedtime. 90 tablet 3   NARCAN 4 MG/0.1ML LIQD nasal spray kit      nitroGLYCERIN (NITROSTAT) 0.4 MG SL tablet DISSOLVE ONE TABLET UNDER THE TONGUE EVERY 5 MINUTES AS NEEDED FOR CHEST PAIN.  DO NOT EXCEED A TOTAL OF 3 DOSES IN 15 MINUTES 25 tablet 0   rosuvastatin (CRESTOR) 40 MG tablet Take 1 tablet (40 mg total) by mouth daily. 90 tablet 3   triamcinolone (NASACORT) 55 MCG/ACT AERO nasal inhaler Place 2 sprays into the nose daily. 1 each 12   valACYclovir  (VALTREX) 1000 MG tablet Take 1 tablet by mouth twice daily 180 tablet 0   Current Facility-Administered Medications on File Prior to Visit  Medication Dose Route Frequency Provider Last Rate Last Admin   Benralizumab SOSY 30 mg  30 mg Subcutaneous Q8 weeks  Valentina Shaggy, MD   30 mg at 04/04/21 0630        ROS:  All others reviewed and negative.  Objective        PE:  BP 128/78   Pulse 78   Temp 98.4 F (36.9 C) (Oral)   Ht '5\' 4"'  (1.626 m)   Wt 127 lb (57.6 kg)   SpO2 99%   BMI 21.80 kg/m                 Constitutional: Pt appears in NAD               HENT: Head: NCAT.                Right Ear: External ear normal.                 Left Ear: External ear normal.                Eyes: . Pupils are equal, round, and reactive to light. Conjunctivae and EOM are normal               Nose: without d/c or deformity               Neck: Neck supple. Gross normal ROM               Cardiovascular: Normal rate and regular rhythm.                 Pulmonary/Chest: Effort normal and breath sounds without rales or wheezing.                Abd:  Soft, NT, ND, + BS, no organomegaly               Neurological: Pt is alert. At baseline orientation, motor grossly intact; has marked tender to lower thoracic, upper lumbar spine in midline               Skin: Skin is warm. No rashes, no other new lesions, LE edema - none               Psychiatric: Pt behavior is normal without agitation   Micro: none  Cardiac tracings I have personally interpreted today:  none  Pertinent Radiological findings (summarize): none   Lab Results  Component Value Date   WBC 8.0 04/02/2021   HGB 12.6 04/02/2021   HCT 37.1 04/02/2021   PLT 205 04/02/2021   GLUCOSE 76 04/11/2021   CHOL 186 11/14/2020   TRIG 222.0 (H) 11/14/2020   HDL 47.80 11/14/2020   LDLDIRECT 112.0 11/14/2020   LDLCALC Comment 03/12/2017   ALT 20 04/02/2021   AST 24 04/02/2021   NA 140 04/11/2021   K 3.3 (L) 04/11/2021   CL 107  04/11/2021   CREATININE 0.89 04/11/2021   BUN 13 04/11/2021   CO2 22 04/11/2021   TSH 1.028 03/28/2021   INR 0.9 08/31/2008   HGBA1C 5.2 11/14/2020   Assessment/Plan:  KIERSTYNN BABICH is a 65 y.o. White or Caucasian [1] female with  has a past medical history of Acute MI ([place]), Allergic rhinitis, Allergy, Anemia, Ankle fracture (05/2016), Anxiety, Arthritis, Asthma, Barrett esophagus (2007), Chest pain (05-02-2009), COPD (chronic obstructive pulmonary disease) with chronic bronchitis ([place]), Depression, Duodenitis without mention of hemorrhage (2007), Esophageal reflux (2007), Esophageal stricture, Full dentures, H/O hiatal hernia, Hiatal hernia (1601,0932), Hyperlipidemia, Multiple fractures, Pneumonia (10/2016), Restrictive lung disease (06/30/2017), and Ulcer.  Vitamin D deficiency Last vitamin  D Lab Results  Component Value Date   VD25OH 40.04 11/14/2020   Stable, cont oral replacement   Asthma-COPD overlap syndrome (HCC) Stable, cont current inhalers  Cigarette smoker Pt counseled to quit, pt not ready  Hyperglycemia Lab Results  Component Value Date   HGBA1C 5.2 11/14/2020   Stable, pt to continue current medical treatment  - diet   B12 deficiency Lab Results  Component Value Date   VITAMINB12 99 (L) 03/30/2021   Very low, to start oral replacement - b12 1000 mcg qd   Thoracic spine pain For xray - r/o fx  Neck pain For xray - r/o fx  Low back pain For xray - r/o fx, also pain control  Bilateral hip pain For xray - r/o fx  AKI (acute kidney injury) ([place]) Also f/u f/u BMP but appears hydrated today, likely resolved low volume  Followup: Return in about 3 months (around 07/12/2021).  Cathlean Cower, MD 04/11/2021 9:53 PM [facility]

## 2021-04-11 NOTE — Assessment & Plan Note (Signed)
Also f/u f/u BMP but appears hydrated today, likely resolved low volume

## 2021-04-11 NOTE — Assessment & Plan Note (Signed)
Lab Results  Component Value Date   VITAMINB12 99 (L) 03/30/2021   Very low, to start oral replacement - b12 1000 mcg qd

## 2021-04-11 NOTE — Patient Instructions (Signed)
Please take all new medication as prescribed - the pain medication pills as needed  You had the pain shot in the office today (toradol)  OK to start the B12 pills - 1 per day  Please continue all other medications as before, and refills have been done if requested.  Please have the pharmacy call with any other refills you may need.  Please continue your efforts at being more active, low cholesterol diet, and weight control  Please keep your appointments with your specialists as you may have planned  You will be contacted regarding the referral for: [place] with Nurse and Physical Therapy  Please go to the XRAY Department in the first floor for the x-ray testing  Please go to the LAB at the blood drawing area for the tests to be done  You will be contacted by phone if any changes need to be made immediately.  Otherwise, you will receive a letter about your results with an explanation, but please check with MyChart first.  Please remember to sign up for MyChart if you have not done so, as this will be important to you in the future with finding out test results, communicating by private email, and scheduling acute appointments online when needed.  Please make an Appointment to return in 3 months

## 2021-04-11 NOTE — Assessment & Plan Note (Signed)
For xray - r/o fx, also pain control

## 2021-04-11 NOTE — Assessment & Plan Note (Signed)
Stable, cont current inhalers

## 2021-04-11 NOTE — Assessment & Plan Note (Signed)
Pt counseled to quit, pt not ready 

## 2021-04-11 NOTE — Assessment & Plan Note (Signed)
Lab Results  Component Value Date   HGBA1C 5.2 11/14/2020   Stable, pt to continue current medical treatment  - diet

## 2021-04-11 NOTE — Telephone Encounter (Signed)
Patient was seen today about back pain...wants to know if she is able to get a back brace to help w/ the back pain & support  Call back #: [phone]

## 2021-04-11 NOTE — Assessment & Plan Note (Signed)
For xray - r/o fx

## 2021-04-11 NOTE — Assessment & Plan Note (Signed)
Last vitamin D Lab Results  Component Value Date   VD25OH 40.04 11/14/2020   Stable, cont oral replacement

## 2021-04-11 NOTE — Telephone Encounter (Signed)
No, we need to see the xray results to see if further evaluation and tx is needed though

## 2021-04-12 NOTE — Telephone Encounter (Signed)
Called and spoke with pt, she verbalized understanding. 

## 2021-04-13 ENCOUNTER — Encounter: Payer: Self-pay | Admitting: Internal Medicine

## 2021-04-13 NOTE — Telephone Encounter (Signed)
Patient calling back.. would like to know if Dr. Jenny Reichmann is able to prescribe her something for back pain/arthritis

## 2021-04-13 NOTE — Telephone Encounter (Signed)
   Please call patient with lab and xray results

## 2021-04-13 NOTE — Telephone Encounter (Signed)
Patient calling back wanting to know about xray results.  Req callback [phone]

## 2021-04-16 MED ORDER — CYCLOBENZAPRINE HCL 5 MG PO TABS: 5.0000 mg | ORAL_TABLET | Freq: Three times a day (TID) | ORAL | 1 refills | Status: DC | PRN

## 2021-04-16 NOTE — Telephone Encounter (Signed)
Ok for trial muscle relaxer - done erx

## 2021-04-24 ENCOUNTER — Other Ambulatory Visit: Payer: Self-pay | Admitting: Allergy & Immunology

## 2021-05-09 ENCOUNTER — Ambulatory Visit (INDEPENDENT_AMBULATORY_CARE_PROVIDER_SITE_OTHER): Payer: Medicare Other

## 2021-05-09 DIAGNOSIS — G43719 Chronic migraine without aura, intractable, without status migrainosus: Secondary | ICD-10-CM | POA: Diagnosis not present

## 2021-05-09 DIAGNOSIS — J309 Allergic rhinitis, unspecified: Secondary | ICD-10-CM

## 2021-05-14 ENCOUNTER — Ambulatory Visit: Payer: Medicare Other | Admitting: General Practice

## 2021-05-15 NOTE — Progress Notes (Signed)
Cardiology Clinic Note   Patient Name: Lori [name] Date of Encounter: 05/15/2021  Primary Care Provider:  Biagio Borg, MD Primary Cardiologist:  Glenetta Hew, MD  Patient Profile    Lori [name] 65 year old female presents the clinic today for follow-up evaluation of her hyperlipidemia and aortic atherosclerosis.  Past Medical History    Past Medical History:  Diagnosis Date   Acute MI ([place])    x3 - by report only. She has had 3 negative Myoview stress test with no evidence of prior infarct.   Allergic rhinitis    Allergy    Anemia    Ankle fracture 05/2016   Anxiety    Arthritis    Asthma    Barrett esophagus 2007   Chest pain 05-02-2009   echo  EF 55%   COPD (chronic obstructive pulmonary disease) with chronic bronchitis (HCC)    & Emphysema   Depression    Duodenitis without mention of hemorrhage 2007   Esophageal reflux 2007   Esophageal stricture    Full dentures    H/O hiatal hernia    Hiatal hernia 2006,2007   Hyperlipidemia    Multiple fractures    from falls, fx rt. elbow, fx left wrist, bilateral ankles   Pneumonia 10/2016   Restrictive lung disease 06/30/2017   Ulcer    Past Surgical History:  Procedure Laterality Date   ABDOMINAL HYSTERECTOMY     BSO   APPENDECTOMY     CESAREAN SECTION     x 2   CHOLECYSTECTOMY     CHONDROPLASTY Left 01/06/2015   Procedure: CHONDROPLASTY;  Surgeon: Marybelle Killings, MD;  Location: [facility];  Service: Orthopedics;  Laterality: Left;   COLONOSCOPY     ELBOW FRACTURE SURGERY     right   KNEE ARTHROSCOPY WITH EXCISION PLICA Left 1/61/0960   Procedure: KNEE ARTHROSCOPY WITH EXCISION PLICA;  Surgeon: Marybelle Killings, MD;  Location: [facility];  Service: Orthopedics;  Laterality: Left;   Lower Extremity Arterial Dopplers  07/06/2013   RABI 1.0, LABI 1.1.; No evidence of significant vascular atherogenic plaque   NECK SURGERY     NM MYOVIEW LTD  09/2014   LOW RISK. Normal EF of 65%  with no regional wall motion abnormalities. No ischemia or infarction.   TONSILLECTOMY     WRIST FRACTURE SURGERY     left, has plate    Allergies  Allergies  Allergen Reactions   Bee Venom Swelling   Codeine Nausea Only and Other (See Comments)    CAUSES ULCERS   Ibuprofen Nausea And Vomiting   Clonidine Derivatives Other (See Comments)    Unknown    Statins    Tramadol Nausea Only   Latex Rash   Levofloxacin Nausea Only   Propoxyphene N-Acetaminophen Nausea Only    History of Present Illness    Lori [name] has a PMH of COPD and multiple cardiovascular risk factors.  She did have negative work-up for ongoing chest discomfort previously.  She presented to the hospital sharp midsternal chest discomfort and mildly elevated troponins 03/29/2021 until 04/02/2021.  She presented with chest pain, weakness and shortness of breath times several weeks.  She described her pain as sharp and 10 out of 10 in intensity.  She also reported dizziness and near syncope.  She reported worsening fatigue and decreased appetite.  Her initial troponin values were elevated at 79 and 83.  However her repeat lab was less than 2 which  suggested there was laboratory abnormalities.  Laboratory values were confirmed/verified with [name].  Her creatinine was elevated at 1.99.  All other work-up was unremarkable.  She underwent a coronary CTA 3/18 after having had 3 nuclear stress test that were negative.  Her coronary calcium score showed no evidence of CAD.  She presents the clinic today for follow-up evaluation states is noticing trouble with her lower extremities.  She reports cramping with each episode of ambulation.  She reports that she has been having increased weakness in her lower extremities for the past several months.  She also reports a brief episode of chest discomfort yesterday.  The episode came on quickly was present behind her sternum and under her left breast.  It resolved without  intervention within a few seconds.  She reports that she is minimally physically active but does ambulate around her property and in the grocery store.  We reviewed her recent hospitalization and diagnostic tests along with her lab work.  She expressed understanding.  I will order lower extremity arterial's, ankle-brachial index, have her increase her physical activity as tolerated and follow-up after testing.  Today she denies chest pain, shortness of breath, lower extremity edema, fatigue, palpitations, melena, hematuria, hemoptysis, diaphoresis, weakness, presyncope, syncope, orthopnea, and PND.   Home Medications    Prior to Admission medications   Medication Sig Start Date End Date Taking? Authorizing Provider  potassium chloride (KLOR-CON 10) 10 MEQ tablet Take 1 tablet (10 mEq total) by mouth daily for 3 days. 04/11/21 04/14/21  Biagio Borg, MD  acetaminophen (TYLENOL) 325 MG tablet Take 2 tablets (650 mg total) by mouth every 6 (six) hours as needed for mild pain or fever. 04/02/21   Cherene Altes, MD  AIMOVIG 140 MG/ML SOAJ Inject 140 mg into the skin every 30 (thirty) days. 01/09/21   [provider]  ALPRAZolam Duanne Moron) 1 MG tablet TAKE 1 TABLET BY MOUTH AT BEDTIME AS NEEDED FOR ANXIETY 02/06/21   Biagio Borg, MD  cetirizine (ZYRTEC) 10 MG tablet Take 1 tablet (10 mg total) by mouth daily. 11/02/20   Dara Hoyer, FNP  cyanocobalamin 1000 MCG tablet Take 1 tablet (1,000 mcg total) by mouth daily. 04/11/21   Biagio Borg, MD  cyclobenzaprine (FLEXERIL) 5 MG tablet Take 1 tablet (5 mg total) by mouth 3 (three) times daily as needed for muscle spasms. 04/16/21   Biagio Borg, MD  DEXILANT 60 MG capsule Take 1 capsule by mouth once daily 10/21/20   Biagio Borg, MD  EPINEPHrine 0.3 mg/0.3 mL IJ SOAJ injection INJECT AS DIRECTED FOR SEVERE ALLERGIC REACTION 11/02/20   Ambs, Kathrine Cords, FNP  FASENRA 30 MG/ML SOSY Inject into the skin. Every 8 weeks 03/22/21   [provider]   fluticasone-salmeterol (ADVAIR HFA) 230-21 MCG/ACT inhaler Inhale 2 puffs into the lungs 2 (two) times daily. 01/01/21   Dara Hoyer, FNP  folic acid (FOLVITE) 1 MG tablet Take 1 tablet (1 mg total) by mouth daily. 04/03/21   Cherene Altes, MD  gabapentin (NEURONTIN) 300 MG capsule Take 300 mg by mouth 2 (two) times daily as needed. 07/13/20   [provider]  HYDROcodone-acetaminophen (NORCO/VICODIN) 5-325 MG tablet Take 1 tablet by mouth every 6 (six) hours as needed. 04/11/21   Biagio Borg, MD  levalbuterol [facility] HFA) 45 MCG/ACT inhaler Inhale 2 puffs into the lungs as directed. 11/02/20   Dara Hoyer, FNP  meclizine (ANTIVERT) 25 MG tablet  Take 1 tablet (25 mg total) by mouth 3 (three) times daily as needed for dizziness. 01/02/21   Biagio Borg, MD  mometasone-formoterol [facility]) 200-5 MCG/ACT AERO Inhale 2 puffs into the lungs 2 (two) times daily. 11/02/20   Dara Hoyer, FNP  montelukast (SINGULAIR) 10 MG tablet Take 1 tablet (10 mg total) by mouth at bedtime. 11/02/20   Dara Hoyer, FNP  NARCAN 4 MG/0.1ML LIQD nasal spray kit  11/12/19   [provider]  nitroGLYCERIN (NITROSTAT) 0.4 MG SL tablet DISSOLVE ONE TABLET UNDER THE TONGUE EVERY 5 MINUTES AS NEEDED FOR CHEST PAIN.  DO NOT EXCEED A TOTAL OF 3 DOSES IN 15 MINUTES 04/28/20   Leonie Man, MD  rosuvastatin (CRESTOR) 40 MG tablet Take 1 tablet (40 mg total) by mouth daily. 11/15/20   Biagio Borg, MD  triamcinolone (NASACORT) 55 MCG/ACT AERO nasal inhaler Place 2 sprays into the nose daily. 01/02/21   Biagio Borg, MD  valACYclovir Estell Harpin) 1000 MG tablet Take 1 tablet by mouth twice daily 02/24/21   Biagio Borg, MD    Family History    Family History  Problem Relation Age of Onset   Emphysema Father    Dementia Mother    Diabetes Maternal Grandmother    Diabetes Maternal Aunt    Diabetes Other        mat. cousin   Heart disease Brother    Colon cancer Neg Hx    Allergic rhinitis Neg Hx     Angioedema Neg Hx    Asthma Neg Hx    Atopy Neg Hx    Eczema Neg Hx    Immunodeficiency Neg Hx    Urticaria Neg Hx    She indicated that her mother is alive. She indicated that her father is deceased. She indicated that the status of her brother is unknown. She indicated that the status of her maternal grandmother is unknown. She indicated that the status of her maternal aunt is unknown. She indicated that the status of her neg hx is unknown. She indicated that the status of her other is unknown.  Social History    Social History   Socioeconomic History   Marital status: Widowed    Spouse name: [name]   Number of children: 2   Years of education: HS   Highest education level: Not on file  Occupational History   Occupation: disable  Tobacco Use   Smoking status: Every Day    Packs/day: 0.50    Years: 39.00    Pack years: 19.50    Types: Cigarettes    Start date: 08/06/1977   Smokeless tobacco: Never  Vaping Use   Vaping Use: Never used  Substance and Sexual Activity   Alcohol use: No    Alcohol/week: 0.0 standard drinks   Drug use: No   Sexual activity: Not Currently  Other Topics Concern   Not on file  Social History Narrative   Patient lives at home spouse - [name].  Has 2 daughters (86 & 42).   Currently "disabled".   Caffeine Use: tea 4 glasses daily.    Smokes ~1/2 PPD  05/15/15   Walks everyday - no set routine.      [facility] (12/03/16):   Originally from [place] [facility] [place]. 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 Currently has a dog. No bird exposure. No known mold exposure.    Social Determinants of Health   Financial Resource Strain: Low Risk    Difficulty  of Paying Living Expenses: Not hard at all  Food Insecurity: No Food Insecurity   Worried About [place] in the Last Year: Never true   [place] in the Last Year: Never true  Transportation Needs: No Transportation Needs   Lack of Transportation (Medical): No   Lack of  Transportation (Non-Medical): No  Physical Activity: Sufficiently Active   Days of Exercise per Week: 5 days   Minutes of Exercise per Session: 30 min  Stress: Stress Concern Present   Feeling of Stress : Very much  Social Connections: Not on file  Intimate Partner Violence: Not on file     Review of Systems    General:  No chills, fever, night sweats or weight changes.  Cardiovascular:  No chest pain, dyspnea on exertion, edema, orthopnea, palpitations, paroxysmal nocturnal dyspnea. Dermatological: No rash, lesions/masses Respiratory: No cough, dyspnea Urologic: No hematuria, dysuria Abdominal:   No nausea, vomiting, diarrhea, bright red blood per rectum, melena, or hematemesis Neurologic:  No visual changes, wkns, changes in mental status. All other systems reviewed and are otherwise negative except as noted above.  Physical Exam    VS:  There were no vitals taken for this visit. , BMI There is no height or weight on file to calculate BMI. GEN: Well nourished, well developed, in no acute distress. HEENT: normal. Neck: Supple, no JVD, carotid bruits, or masses. Cardiac: RRR, no murmurs, rubs, or gallops. No clubbing, cyanosis, edema.  right DP/PT 1+ and left PT 1+ and equal bilaterally.  Respiratory:  Respirations regular and unlabored, clear to auscultation bilaterally. GI: Soft, nontender, nondistended, BS + x 4. MS: no deformity or atrophy. Skin: warm and dry, no rash. Neuro:  Strength and sensation are intact. Psych: Normal affect.  Accessory Clinical Findings    Recent Labs: 03/28/2021: TSH 1.028 04/02/2021: ALT 20; Hemoglobin 12.6; Magnesium 2.1; Platelets 205 04/11/2021: BUN 13; Creatinine, Ser 0.89; Potassium 3.3; Sodium 140   Recent Lipid Panel    Component Value Date/Time   CHOL 186 11/14/2020 1150   CHOL 316 (H) 03/12/2017 1055   TRIG 222.0 (H) 11/14/2020 1150   HDL 47.80 11/14/2020 1150   HDL 40 03/12/2017 1055   CHOLHDL 4 11/14/2020 1150   VLDL 44.4 (H)  11/14/2020 1150   LDLCALC Comment 03/12/2017 1055   LDLDIRECT 112.0 11/14/2020 1150    ECG personally reviewed by me today-  -none today. Nuclear stress test 09/28/2014  Nuclear Med Background Indication for Stress Test:  Evaluation for Ischemia and Post Hospital History:  Asthma, COPD, Emphysema and MI X3;Last NUC MPI on 08/02/2013-normal;EF=83% Cardiac Risk Factors: Family History - CAD, Hypertension, Lipids, Smoker and Bilateral leg pain  Symptoms:  Chest Pain, Dizziness, DOE, Fatigue, Light-Headedness, Nausea, Palpitations and Vomiting     Nuclear Pre-Procedure Caffeine/Decaff Intake:  7:00pm NPO After: 5:00am   IV Site: R Forearm  IV 0.9% NS with Angio Cath:  22g  Chest Size (in):  n/a IV Started by: Rolene Course, RN  Height: 5' 4" (1.626 m)  Cup Size: D  BMI:  Body mass index is 22.65 kg/(m^2). Weight:  132 lb (59.875 kg)    Tech Comments:  n/a      Nuclear Med Study 1 or 2 day study: 1 day  Stress Test Type:  [place] Provider:  Glenetta Hew, MD    Resting Radionuclide: Technetium 86mSestamibi  Resting Radionuclide Dose: 10.4 mCi   Stress Radionuclide:  Technetium 925mestamibi  Stress Radionuclide  Dose: 30.9 mCi            Stress Protocol Rest HR: 74 Stress HR: 103  Rest BP: 109/79 Stress BP:143/60  Exercise Time (min): n/a METS: n/a      Dose of Adenosine (mg):  n/a Dose of Lexiscan: 0.4 mg  Dose of Atropine (mg): n/a Dose of Dobutamine: n/a mcg/kg/min (at max HR)  Stress Test Technologist: Mellody Memos, CCT Nuclear Technologist: Imagene Riches, CNMT    Rest Procedure:  Myocardial perfusion imaging was performed at rest 45 minutes following the intravenous administration of Technetium 42mSestamibi. Stress Procedure:  The patient received IV Lexiscan 0.4 mg over 15-seconds.  Technetium 954mestamibi injected IV at 30-seconds. Patient experienced shortness of breath and was administered 75 mg of Aminophylline IV.  There were no significant  changes with Lexiscan.  Quantitative spect images were obtained after a 45 minute delay.   Transient Ischemic Dilatation (Normal <1.22):  1.37   QGS EDV:  54 ml QGS ESV:  18 ml LV Ejection Fraction: 66%   Rest ECG: NSR - Normal EKG   Stress ECG: No significant change from baseline ECG   QPS Raw Data Images:  Normal; no motion artifact; normal heart/lung ratio. Stress Images:  Normal homogeneous uptake in all areas of the myocardium. Rest Images:  Normal homogeneous uptake in all areas of the myocardium. Subtraction (SDS):  No evidence of ischemia.   Impression Exercise Capacity:  Lexiscan with no exercise. BP Response:  Normal blood pressure response. Clinical Symptoms:  No significant symptoms noted. ECG Impression:  No significant ECG changes with Lexiscan. Comparison with Prior Nuclear Study: No previous nuclear study performed   Overall Impression:  Normal stress nuclear study. TID is abnormal at 1.37.   LV Wall Motion:  NL LV Function; NL Wall Motion; LVEF 66%.   KePixie CasinoMD, FARidgeview Lesueur Medical Centeroard Certified in Nuclear Cardiology Attending Cardiologist CHOak ForestEchocardiogram 03/28/2021 IMPRESSIONS     1. Left ventricular ejection fraction, by estimation, is 60 to 65%. The  left ventricle has normal function. The left ventricle has no regional  wall motion abnormalities. Left ventricular diastolic parameters are  consistent with Grade I diastolic  dysfunction (impaired relaxation).   2. Right ventricular systolic function is normal. The right ventricular  size is normal. Tricuspid regurgitation signal is inadequate for assessing  PA pressure.   3. The mitral valve is normal in structure. No evidence of mitral valve  regurgitation. No evidence of mitral stenosis.   4. The aortic valve is tricuspid. Aortic valve regurgitation is not  visualized. No aortic stenosis is present.   5. The inferior vena cava is normal in size with greater than 50%  respiratory  variability, suggesting right atrial pressure of 3 mmHg.  Assessment & Plan   1.  Atypical chest pain-no further episodes of arm neck back or chest discomfort.  Presented to the emergency department 7/22 with complaints of substernal chest pain.  Lab reported initial elevated troponins which were a lab result error.  She was discharged in stable condition.  Chest discomfort was felt to be unrelated to cardiac issues. Heart healthy low-sodium diet-salty 6 given Increase physical activity as tolerated  Claudication-reports bilateral lower extremity cramping with minimal physical activity/ambulation.  Noted to have +1 right DP and PT, and weak left lower extremity pulses. Order lower extremity arterial's, ABIs Continue rosuvastatin Heart healthy low-sodium high-fiber diet Increase physical activity as tolerated  Hyperlipidemia-11/14/2020: Cholesterol 186; HDL 47.80; Triglycerides 222.0; VLDL 44.4 Continue  rosuvastatin Heart healthy low-sodium high-fiber diet Increase physical activity as tolerated  Borderline hypertension-BP today 130/72.  Well-controlled on. Blood pressure log Heart healthy low-sodium diet-salty 6 given Increase physical activity as tolerated  Dizziness-reports compliance with her meclizine medication.  Intermittent periods well managed by meclizine. Follows with PCP  Asthma-COPD-reports compliance with Nasacort and Advair Follows with PCP  AKI-creatinine 0.89 on 04/11/2021 Follows with PCP  Disposition: Follow-up with Dr. Ellyn Hack in 3 months.  Jossie Ng.  NP-C    05/15/2021, 7:27 AM [address] Office [phone] Fax [phone]  Notice: This dictation was prepared with Dragon dictation along with smaller phrase technology. Any transcriptional errors that result from this process are unintentional and may not be corrected upon review.  I spent 14 minutes examining this patient, reviewing medications,  and using patient centered shared decision making involving her cardiac care.  Prior to her visit I spent greater than 20 minutes reviewing her past medical history,  medications, and prior cardiac tests.

## 2021-05-17 ENCOUNTER — Other Ambulatory Visit: Payer: Self-pay

## 2021-05-17 ENCOUNTER — Ambulatory Visit (INDEPENDENT_AMBULATORY_CARE_PROVIDER_SITE_OTHER): Payer: Medicare Other | Admitting: General Practice

## 2021-05-17 ENCOUNTER — Encounter: Payer: Self-pay | Admitting: General Practice

## 2021-05-17 VITALS — BP 130/72 | HR 78 | Ht 64.0 in | Wt 125.0 lb

## 2021-05-17 DIAGNOSIS — N179 Acute kidney failure, unspecified: Secondary | ICD-10-CM

## 2021-05-17 DIAGNOSIS — R42 Dizziness and giddiness: Secondary | ICD-10-CM

## 2021-05-17 DIAGNOSIS — J449 Chronic obstructive pulmonary disease, unspecified: Secondary | ICD-10-CM | POA: Diagnosis not present

## 2021-05-17 DIAGNOSIS — I739 Peripheral vascular disease, unspecified: Secondary | ICD-10-CM

## 2021-05-17 DIAGNOSIS — E785 Hyperlipidemia, unspecified: Secondary | ICD-10-CM | POA: Diagnosis not present

## 2021-05-17 DIAGNOSIS — R030 Elevated blood-pressure reading, without diagnosis of hypertension: Secondary | ICD-10-CM | POA: Diagnosis not present

## 2021-05-17 DIAGNOSIS — R0789 Other chest pain: Secondary | ICD-10-CM

## 2021-05-17 MED ORDER — NITROGLYCERIN 0.4 MG SL SUBL: 0.4000 mg | SUBLINGUAL_TABLET | SUBLINGUAL | 1 refills | Status: DC | PRN

## 2021-05-17 NOTE — Patient Instructions (Signed)
Medication Instructions:  The current medical regimen is effective;  continue present plan and medications as directed. Please refer to the Current Medication list given to you today.   *If you need a refill on your cardiac medications before your next appointment, please call your pharmacy*  Lab Work:   Testing/Procedures:  NONE    SCHEDULE BILATERAL ABI'S/LEA'S  Special Instructions PLEASE READ AND FOLLOW INCREASED FIBER DIET-ATTACHED  PLEASE INCREASE PHYSICAL ACTIVITY AS TOLERATED,   Follow-Up: Your next appointment:  3 month(s) In Person with Glenetta Hew, MD   At [facility], you and your health needs are our priority.  As part of our continuing mission to provide you with exceptional heart care, we have created designated Provider Care Teams.  These Care Teams include your primary Cardiologist (physician) and Advanced Practice Providers (APPs -  Physician Assistants and Nurse Practitioners) who all work together to provide you with the care you need, when you need it.  We recommend signing up for the patient portal called "MyChart".  Sign up information is provided on this After Visit Summary.  MyChart is used to connect with patients for Virtual Visits (Telemedicine).  Patients are able to view lab/test results, encounter notes, upcoming appointments, etc.  Non-urgent messages can be sent to your provider as well.   To learn more about what you can do with MyChart, go to [email or web address].            High-Fiber Eating Plan Fiber, also called dietary fiber, is a type of carbohydrate. It is found foods such as fruits, vegetables, whole grains, and beans. A high-fiber diet can have many health benefits. Your health care provider may recommend a high-fiber diet to help: Prevent constipation. Fiber can make your bowel movements more regular. Lower your cholesterol. Relieve the following conditions: Inflammation of veins in the anus (hemorrhoids). Inflammation of specific  areas of the digestive tract (uncomplicated diverticulosis). A problem of the large intestine, also called the colon, that sometimes causes pain and diarrhea (irritable bowel syndrome, or IBS). Prevent overeating as part of a weight-loss plan. Prevent heart disease, type 2 diabetes, and certain cancers. What are tips for following this plan? Reading food labels  Check the nutrition facts label on food products for the amount of dietary fiber. Choose foods that have 5 grams of fiber or more per serving. The goals for recommended daily fiber intake include: Men (age 50 or younger): 34-38 g. Men (over age 49): 28-34 g. Women (age 32 or younger): 25-28 g. Women (over age 53): 22-25 g. Your daily fiber goal is _____________ g. Shopping Choose whole fruits and vegetables instead of processed forms, such as apple juice or applesauce. Choose a wide variety of high-fiber foods such as avocados, lentils, oats, and kidney beans. Read the nutrition facts label of the foods you choose. Be aware of foods with added fiber. These foods often have high sugar and sodium amounts per serving. Cooking Use whole-grain flour for baking and cooking. Cook with brown rice instead of white rice. Meal planning Start the day with a breakfast that is high in fiber, such as a cereal that contains 5 g of fiber or more per serving. Eat breads and cereals that are made with whole-grain flour instead of refined flour or white flour. Eat brown rice, bulgur wheat, or millet instead of white rice. Use beans in place of meat in soups, salads, and pasta dishes. Be sure that half of the grains you eat each day are whole grains. General  information You can get the recommended daily intake of dietary fiber by: Eating a variety of fruits, vegetables, grains, nuts, and beans. Taking a fiber supplement if you are not able to take in enough fiber in your diet. It is better to get fiber through food than from a supplement. Gradually  increase how much fiber you consume. If you increase your intake of dietary fiber too quickly, you may have bloating, cramping, or gas. Drink plenty of water to help you digest fiber. Choose high-fiber snacks, such as berries, raw vegetables, nuts, and popcorn. What foods should I eat? Fruits Berries. Pears. Apples. Oranges. Avocado. Prunes and raisins. Dried figs. Vegetables Sweet potatoes. Spinach. Kale. Artichokes. Cabbage. Broccoli. Cauliflower. Green peas. Carrots. Squash. Grains Whole-grain breads. Multigrain cereal. Oats and oatmeal. Brown rice. Barley. Bulgur wheat. [place]. Quinoa. Bran muffins. Popcorn. Rye wafer crackers. Meats and other proteins Navy beans, kidney beans, and pinto beans. Soybeans. Split peas. Lentils. Nuts and seeds. Dairy Fiber-fortified yogurt. Beverages Fiber-fortified soy milk. Fiber-fortified orange juice. Other foods Fiber bars. The items listed above may not be a complete list of recommended foods and beverages. Contact a dietitian for more information. What foods should I avoid? Fruits Fruit juice. Cooked, strained fruit. Vegetables Fried potatoes. Canned vegetables. Well-cooked vegetables. Grains White bread. Pasta made with refined flour. White rice. Meats and other proteins Fatty cuts of meat. Fried chicken or fried fish. Dairy Milk. Yogurt. Cream cheese. Sour cream. Fats and oils Butters. Beverages Soft drinks. Other foods Cakes and pastries. The items listed above may not be a complete list of foods and beverages to avoid. Talk with your dietitian about what choices are best for you. Summary Fiber is a type of carbohydrate. It is found in foods such as fruits, vegetables, whole grains, and beans. A high-fiber diet has many benefits. It can help to prevent constipation, lower blood cholesterol, aid weight loss, and reduce your risk of heart disease, diabetes, and certain cancers. Increase your intake of fiber gradually. Increasing fiber  too quickly may cause cramping, bloating, and gas. Drink plenty of water while you increase the amount of fiber you consume. The best sources of fiber include whole fruits and vegetables, whole grains, nuts, seeds, and beans. This information is not intended to replace advice given to you by your health care provider. Make sure you discuss any questions you have with your health care provider. Document Revised: 01/06/2020 Document Reviewed: 01/06/2020 Elsevier Patient Education  2022 [employer].

## 2021-05-22 ENCOUNTER — Ambulatory Visit (HOSPITAL_COMMUNITY)
Admission: RE | Admit: 2021-05-22 | Discharge: 2021-05-22 | Disposition: A | Discharge status: Home / Self Care | Payer: Medicare Other | Source: Ambulatory Visit | Attending: Cardiovascular Disease | Admitting: Cardiovascular Disease

## 2021-05-22 ENCOUNTER — Other Ambulatory Visit: Payer: Self-pay

## 2021-05-22 DIAGNOSIS — I739 Peripheral vascular disease, unspecified: Secondary | ICD-10-CM | POA: Insufficient documentation

## 2021-05-23 ENCOUNTER — Telehealth: Payer: Self-pay | Admitting: Cardiology

## 2021-05-23 ENCOUNTER — Encounter: Payer: Self-pay | Admitting: Internal Medicine

## 2021-05-23 ENCOUNTER — Ambulatory Visit (INDEPENDENT_AMBULATORY_CARE_PROVIDER_SITE_OTHER): Payer: Medicare Other

## 2021-05-23 ENCOUNTER — Ambulatory Visit (INDEPENDENT_AMBULATORY_CARE_PROVIDER_SITE_OTHER): Payer: Medicare Other | Admitting: Internal Medicine

## 2021-05-23 VITALS — BP 118/60 | HR 90 | Temp 98.7°F | Ht 64.0 in | Wt 127.0 lb

## 2021-05-23 DIAGNOSIS — R739 Hyperglycemia, unspecified: Secondary | ICD-10-CM | POA: Diagnosis not present

## 2021-05-23 DIAGNOSIS — E785 Hyperlipidemia, unspecified: Secondary | ICD-10-CM

## 2021-05-23 DIAGNOSIS — E559 Vitamin D deficiency, unspecified: Secondary | ICD-10-CM

## 2021-05-23 DIAGNOSIS — E538 Deficiency of other specified B group vitamins: Secondary | ICD-10-CM

## 2021-05-23 DIAGNOSIS — J449 Chronic obstructive pulmonary disease, unspecified: Secondary | ICD-10-CM | POA: Diagnosis not present

## 2021-05-23 DIAGNOSIS — J441 Chronic obstructive pulmonary disease with (acute) exacerbation: Secondary | ICD-10-CM

## 2021-05-23 LAB — HEPATIC FUNCTION PANEL
ALT: 8 U/L (ref 0–35)
AST: 10 U/L (ref 0–37)
Albumin: 4.3 g/dL (ref 3.5–5.2)
Alkaline Phosphatase: 64 U/L (ref 39–117)
Bilirubin, Direct: 0.1 mg/dL (ref 0.0–0.3)
Total Bilirubin: 0.4 mg/dL (ref 0.2–1.2)
Total Protein: 6.6 g/dL (ref 6.0–8.3)

## 2021-05-23 LAB — CBC WITH DIFFERENTIAL/PLATELET
Basophils Absolute: 0 10*3/uL (ref 0.0–0.1)
Basophils Relative: 0.3 % (ref 0.0–3.0)
Eosinophils Absolute: 0 10*3/uL (ref 0.0–0.7)
Eosinophils Relative: 0 % (ref 0.0–5.0)
HCT: 41.3 % (ref 36.0–46.0)
Hemoglobin: 13.9 g/dL (ref 12.0–15.0)
Lymphocytes Relative: 35.1 % (ref 12.0–46.0)
Lymphs Abs: 2.8 10*3/uL (ref 0.7–4.0)
MCHC: 33.6 g/dL (ref 30.0–36.0)
MCV: 95.5 fl (ref 78.0–100.0)
Monocytes Absolute: 0.5 10*3/uL (ref 0.1–1.0)
Monocytes Relative: 6.4 % (ref 3.0–12.0)
Neutro Abs: 4.7 10*3/uL (ref 1.4–7.7)
Neutrophils Relative %: 58.2 % (ref 43.0–77.0)
Platelets: 269 10*3/uL (ref 150.0–400.0)
RBC: 4.32 Mil/uL (ref 3.87–5.11)
RDW: 13.1 % (ref 11.5–15.5)
WBC: 8.1 10*3/uL (ref 4.0–10.5)

## 2021-05-23 LAB — HEMOGLOBIN A1C: Hgb A1c MFr Bld: 5.3 % (ref 4.6–6.5)

## 2021-05-23 LAB — VITAMIN B12: Vitamin B-12: 677 pg/mL (ref 211–911)

## 2021-05-23 LAB — BASIC METABOLIC PANEL
BUN: 7 mg/dL (ref 6–23)
CO2: 25 mEq/L (ref 19–32)
Calcium: 9 mg/dL (ref 8.4–10.5)
Chloride: 105 mEq/L (ref 96–112)
Creatinine, Ser: 0.78 mg/dL (ref 0.40–1.20)
GFR: 80.06 mL/min (ref >60.00)
Glucose, Bld: 92 mg/dL (ref 70–99)
Potassium: 3.6 mEq/L (ref 3.5–5.1)
Sodium: 139 mEq/L (ref 135–145)

## 2021-05-23 LAB — LDL CHOLESTEROL, DIRECT: Direct LDL: 212 mg/dL

## 2021-05-23 LAB — LIPID PANEL
Cholesterol: 297 mg/dL — ABNORMAL HIGH (ref 0–200)
HDL: 36.1 mg/dL — ABNORMAL LOW (ref >39.00)
Total CHOL/HDL Ratio: 8
Triglycerides: 434 mg/dL — ABNORMAL HIGH (ref 0.0–149.0)

## 2021-05-23 LAB — VITAMIN D 25 HYDROXY (VIT D DEFICIENCY, FRACTURES): VITD: 16.81 ng/mL — ABNORMAL LOW (ref 30.00–100.00)

## 2021-05-23 MED ORDER — HYDROCODONE BIT-HOMATROP MBR 5-1.5 MG/5ML PO SOLN: 5.0000 mL | Freq: Four times a day (QID) | ORAL | 0 refills | Status: AC | PRN

## 2021-05-23 MED ORDER — PREDNISONE 10 MG PO TABS: ORAL_TABLET | ORAL | 0 refills | Status: DC

## 2021-05-23 MED ORDER — BENZONATATE 100 MG PO CAPS: 200.0000 mg | ORAL_CAPSULE | Freq: Three times a day (TID) | ORAL | 3 refills | Status: DC | PRN

## 2021-05-23 NOTE — Telephone Encounter (Signed)
Pt returning phone call for results, please advise

## 2021-05-23 NOTE — Progress Notes (Signed)
Patient ID: Lori [name], female   DOB: [birth year], 65 y.o.   MRN: [id number]        Chief Complaint: follow up HTN, HLD and hyperglycemia, wheezing/sob, left pleural effusion, low b12 and low vit d       HPI:  Lori [name] is a 65 y.o. female here with c/o 1 wk onset wheezing/sob with non prod cough and no fever, mild, constant, not getting better but no worse; nothing makes better or worse  most recent cxr had small left pleural effusion.  Taking low B12 and not taking Vit D.  Pt denies chest pain, orthopnea, PND, increased LE swelling, palpitations, dizziness or syncope.   Pt denies polydipsia, polyuria, or new focal neuro s/s.   Pt denies fever, wt loss, night sweats, loss of appetite, or other constitutional symptoms         Wt Readings from Last 3 Encounters:  05/23/21 127 lb (57.6 kg)  05/17/21 125 lb (56.7 kg)  04/11/21 127 lb (57.6 kg)   BP Readings from Last 3 Encounters:  05/23/21 118/60  05/17/21 130/72  04/11/21 128/78         Past Medical History:  Diagnosis Date   Acute MI ([place])    x3 - by report only. She has had 3 negative Myoview stress test with no evidence of prior infarct.   Allergic rhinitis    Allergy    Anemia    Ankle fracture 05/2016   Anxiety    Arthritis    Asthma    Barrett esophagus 2007   Chest pain 05-02-2009   echo  EF 55%   COPD (chronic obstructive pulmonary disease) with chronic bronchitis (HCC)    & Emphysema   Depression    Duodenitis without mention of hemorrhage 2007   Esophageal reflux 2007   Esophageal stricture    Full dentures    H/O hiatal hernia    Hiatal hernia 2006,2007   Hyperlipidemia    Multiple fractures    from falls, fx rt. elbow, fx left wrist, bilateral ankles   Pneumonia 10/2016   Restrictive lung disease 06/30/2017   Ulcer    Past Surgical History:  Procedure Laterality Date   ABDOMINAL HYSTERECTOMY     BSO   APPENDECTOMY     CESAREAN SECTION     x 2   CHOLECYSTECTOMY     CHONDROPLASTY Left  01/06/2015   Procedure: CHONDROPLASTY;  Surgeon: Marybelle Killings, MD;  Location: [facility];  Service: Orthopedics;  Laterality: Left;   COLONOSCOPY     ELBOW FRACTURE SURGERY     right   KNEE ARTHROSCOPY WITH EXCISION PLICA Left 5/46/5681   Procedure: KNEE ARTHROSCOPY WITH EXCISION PLICA;  Surgeon: Marybelle Killings, MD;  Location: [facility];  Service: Orthopedics;  Laterality: Left;   Lower Extremity Arterial Dopplers  07/06/2013   RABI 1.0, LABI 1.1.; No evidence of significant vascular atherogenic plaque   NECK SURGERY     NM MYOVIEW LTD  09/2014   LOW RISK. Normal EF of 65% with no regional wall motion abnormalities. No ischemia or infarction.   TONSILLECTOMY     WRIST FRACTURE SURGERY     left, has plate    reports that she has been smoking cigarettes. She started smoking about 43 years ago. She has a 19.50 pack-year smoking history. She has never used smokeless tobacco. She reports that she does not drink alcohol and does not use drugs. family history includes Dementia  in her mother; Diabetes in her maternal aunt, maternal grandmother, and another family member; Emphysema in her father; Heart disease in her brother. Allergies  Allergen Reactions   Bee Venom Swelling   Codeine Nausea Only and Other (See Comments)    CAUSES ULCERS   Ibuprofen Nausea And Vomiting   Clonidine Derivatives Other (See Comments)    Unknown    Statins    Tramadol Nausea Only   Latex Rash   Levofloxacin Nausea Only   Propoxyphene N-Acetaminophen Nausea Only   Current Outpatient Medications on File Prior to Visit  Medication Sig Dispense Refill   AIMOVIG 140 MG/ML SOAJ Inject 140 mg into the skin every 30 (thirty) days.     ALPRAZolam (XANAX) 1 MG tablet TAKE 1 TABLET BY MOUTH AT BEDTIME AS NEEDED FOR ANXIETY 90 tablet 1   cetirizine (ZYRTEC) 10 MG tablet Take 1 tablet (10 mg total) by mouth daily. 31 tablet 5   cyanocobalamin 1000 MCG tablet Take 1 tablet (1,000 mcg total) by  mouth daily. 90 tablet 3   cyclobenzaprine (FLEXERIL) 5 MG tablet Take 1 tablet (5 mg total) by mouth 3 (three) times daily as needed for muscle spasms. 30 tablet 1   DEXILANT 60 MG capsule Take 1 capsule by mouth once daily 90 capsule 3   EPINEPHrine 0.3 mg/0.3 mL IJ SOAJ injection INJECT AS DIRECTED FOR SEVERE ALLERGIC REACTION 2 each 1   FASENRA 30 MG/ML SOSY Inject into the skin. Every 8 weeks     fluticasone-salmeterol (ADVAIR HFA) 230-21 MCG/ACT inhaler Inhale 2 puffs into the lungs 2 (two) times daily. 12 g 5   folic acid (FOLVITE) 1 MG tablet Take 1 tablet (1 mg total) by mouth daily.     gabapentin (NEURONTIN) 300 MG capsule Take 300 mg by mouth 2 (two) times daily as needed.     levalbuterol (XOPENEX HFA) 45 MCG/ACT inhaler Inhale 2 puffs into the lungs as directed. 15 g 2   meclizine (ANTIVERT) 25 MG tablet Take 1 tablet (25 mg total) by mouth 3 (three) times daily as needed for dizziness. 40 tablet 2   metoCLOPramide (REGLAN) 10 MG tablet Take by mouth.     mometasone-formoterol (DULERA) 200-5 MCG/ACT AERO Inhale 2 puffs into the lungs 2 (two) times daily. 1 each 5   montelukast (SINGULAIR) 10 MG tablet Take 1 tablet (10 mg total) by mouth at bedtime. 90 tablet 3   NARCAN 4 MG/0.1ML LIQD nasal spray kit      nitroGLYCERIN (NITROSTAT) 0.4 MG SL tablet Place 1 tablet (0.4 mg total) under the tongue every 5 (five) minutes as needed for chest pain. 25 tablet 1   potassium chloride (KLOR-CON 10) 10 MEQ tablet Take 1 tablet (10 mEq total) by mouth daily for 3 days. 3 tablet 0   rosuvastatin (CRESTOR) 40 MG tablet Take 1 tablet (40 mg total) by mouth daily. 90 tablet 3   triamcinolone (NASACORT) 55 MCG/ACT AERO nasal inhaler Place 2 sprays into the nose daily. 1 each 12   valACYclovir (VALTREX) 1000 MG tablet Take 1 tablet by mouth twice daily 180 tablet 0   acetaminophen (TYLENOL) 325 MG tablet Take 2 tablets (650 mg total) by mouth every 6 (six) hours as needed for mild pain or fever.  (Patient not taking: No sig reported)     Current Facility-Administered Medications on File Prior to Visit  Medication Dose Route Frequency Provider Last Rate Last Admin   Benralizumab SOSY 30 mg  30 mg Subcutaneous Q8  weeks Valentina Shaggy, MD   30 mg at 04/04/21 1165        ROS:  All others reviewed and negative.  Objective        PE:  BP 118/60 (BP Location: Left Arm, Patient Position: Sitting, Cuff Size: Normal)   Pulse 90   Temp 98.7 F (37.1 C) (Oral)   Ht '5\' 4"'  (1.626 m)   Wt 127 lb (57.6 kg)   SpO2 98%   BMI 21.80 kg/m                 Constitutional: Pt appears in NAD               HENT: Head: NCAT.                Right Ear: External ear normal.                 Left Ear: External ear normal.                Eyes: . Pupils are equal, round, and reactive to light. Conjunctivae and EOM are normal               Nose: without d/c or deformity               Neck: Neck supple. Gross normal ROM               Cardiovascular: Normal rate and regular rhythm.                 Pulmonary/Chest: Effort normal and breath sounds decreased without rales but with mild bilat wheezing.                Abd:  Soft, NT, ND, + BS, no organomegaly               Neurological: Pt is alert. At baseline orientation, motor grossly intact               Skin: Skin is warm. No rashes, no other new lesions, LE edema - none               Psychiatric: Pt behavior is normal without agitation   Micro: none  Cardiac tracings I have personally interpreted today:  none  Pertinent Radiological findings (summarize): none   Lab Results  Component Value Date   WBC 8.1 05/23/2021   HGB 13.9 05/23/2021   HCT 41.3 05/23/2021   PLT 269.0 05/23/2021   GLUCOSE 92 05/23/2021   CHOL 297 (H) 05/23/2021   TRIG (H) 05/23/2021    434.0 Triglyceride is over 400; calculations on Lipids are invalid.   HDL 36.10 (L) 05/23/2021   LDLDIRECT 212.0 05/23/2021   LDLCALC Comment 03/12/2017   ALT 8 05/23/2021   AST 10  05/23/2021   NA 139 05/23/2021   K 3.6 05/23/2021   CL 105 05/23/2021   CREATININE 0.78 05/23/2021   BUN 7 05/23/2021   CO2 25 05/23/2021   TSH 1.028 03/28/2021   INR 0.9 08/31/2008   HGBA1C 5.3 05/23/2021   Assessment/Plan:  NANCIE BOCANEGRA is a 66 y.o. White or Caucasian [1] female with  has a past medical history of Acute MI ([place]), Allergic rhinitis, Allergy, Anemia, Ankle fracture (05/2016), Anxiety, Arthritis, Asthma, Barrett esophagus (2007), Chest pain (05-02-2009), COPD (chronic obstructive pulmonary disease) with chronic bronchitis ([place]), Depression, Duodenitis without mention of hemorrhage (2007), Esophageal reflux (2007), Esophageal stricture, Full dentures, H/O hiatal hernia, Hiatal hernia ([phone]),  Hyperlipidemia, Multiple fractures, Pneumonia (10/2016), Restrictive lung disease (06/30/2017), and Ulcer.  COPD exacerbation ([place]) Recent onset mild without fever, for prednisone, cough med prn and contd inhaler use,  For cxr, and refer pulmonary, to f/u any worsening symptoms or concerns  Vitamin D deficiency Last vitamin D Lab Results  Component Value Date   VD25OH 16.81 (L) 05/23/2021   Low, to start oral replacement  Hyperlipidemia LDL goal <100 Lab Results  Component Value Date   CHOL 297 (H) 05/23/2021   CHOL 186 11/14/2020   CHOL 246 (H) 11/12/2019   Lab Results  Component Value Date   HDL 36.10 (L) 05/23/2021   HDL 47.80 11/14/2020   HDL 29.40 (L) 11/12/2019   Lab Results  Component Value Date   LDLCALC Comment 03/12/2017   LDLCALC 104 (H) 11/20/2016   Lab Results  Component Value Date   TRIG (H) 05/23/2021    434.0 Triglyceride is over 400; calculations on Lipids are invalid.   TRIG 222.0 (H) 11/14/2020   TRIG 388.0 (H) 11/12/2019   Lab Results  Component Value Date   CHOLHDL 8 05/23/2021   CHOLHDL 4 11/14/2020   CHOLHDL 8 11/12/2019   Lab Results  Component Value Date   LDLDIRECT 212.0 05/23/2021   LDLDIRECT 112.0 11/14/2020   LDLDIRECT  157.0 11/12/2019   Uncontrolled, very severe elevated LDL, encouraged for low chol diet but restart statin with good compliance   Hyperglycemia Lab Results  Component Value Date   HGBA1C 5.3 05/23/2021   Stable, pt to continue current medical treatment  - diet   B12 deficiency Lab Results  Component Value Date   VITAMINB12 677 05/23/2021   Stable, cont oral replacement - b12 1000 mcg qd  Followup: No follow-ups on file.  Cathlean Cower, MD 05/27/2021 11:40 AM [facility]'

## 2021-05-23 NOTE — Telephone Encounter (Signed)
Tried to call pt and she states that she "already talked to someone about it" and hung up

## 2021-05-23 NOTE — Telephone Encounter (Signed)
Returned the call to the patient. She stated that she needed some clarification on what was discussed at her cardiology appointment concerning her heart. She was unable to explain more.

## 2021-05-23 NOTE — Patient Instructions (Addendum)
Please take all new medication as prescribed - the prednisone and cough medicine  Please continue all other medications as before, and refills have been done if requested.  Please have the pharmacy call with any other refills you may need.  Please continue your efforts at being more active, low cholesterol diet, and weight control.  You are otherwise up to date with prevention measures today.  Please keep your appointments with your specialists as you may have planned  You will be contacted regarding the referral for: pulmonary  Please go to the XRAY Department in the first floor for the x-ray testing  Please go to the LAB at the blood drawing area for the tests to be done  You will be contacted by phone if any changes need to be made immediately.  Otherwise, you will receive a letter about your results with an explanation, but please check with MyChart first.  Please remember to sign up for MyChart if you have not done so, as this will be important to you in the future with finding out test results, communicating by private email, and scheduling acute appointments online when needed.  Please make an Appointment to return in 6 months, or sooner if need

## 2021-05-24 ENCOUNTER — Encounter: Payer: Self-pay | Admitting: Internal Medicine

## 2021-05-24 ENCOUNTER — Telehealth: Payer: Self-pay | Admitting: Internal Medicine

## 2021-05-24 NOTE — Telephone Encounter (Signed)
Patient is requesting nurse or provider to call regarding lab results & imaging results from recent visit  Please call [phone]

## 2021-05-25 NOTE — Telephone Encounter (Signed)
Discussed labs and imaging in detail with patient.

## 2021-05-27 NOTE — Assessment & Plan Note (Signed)
Lab Results  Component Value Date   CHOL 297 (H) 05/23/2021   CHOL 186 11/14/2020   CHOL 246 (H) 11/12/2019   Lab Results  Component Value Date   HDL 36.10 (L) 05/23/2021   HDL 47.80 11/14/2020   HDL 29.40 (L) 11/12/2019   Lab Results  Component Value Date   LDLCALC Comment 03/12/2017   LDLCALC 104 (H) 11/20/2016   Lab Results  Component Value Date   TRIG (H) 05/23/2021    434.0 Triglyceride is over 400; calculations on Lipids are invalid.   TRIG 222.0 (H) 11/14/2020   TRIG 388.0 (H) 11/12/2019   Lab Results  Component Value Date   CHOLHDL 8 05/23/2021   CHOLHDL 4 11/14/2020   CHOLHDL 8 11/12/2019   Lab Results  Component Value Date   LDLDIRECT 212.0 05/23/2021   LDLDIRECT 112.0 11/14/2020   LDLDIRECT 157.0 11/12/2019   Uncontrolled, very severe elevated LDL, encouraged for low chol diet but restart statin with good compliance

## 2021-05-27 NOTE — Assessment & Plan Note (Addendum)
Recent onset mild without fever, for prednisone, cough med prn and contd inhaler use,  For cxr, and refer pulmonary, to f/u any worsening symptoms or concerns

## 2021-05-27 NOTE — Assessment & Plan Note (Signed)
Lab Results  Component Value Date   [id number] 05/23/2021   Stable, cont oral replacement - b12 1000 mcg qd

## 2021-05-27 NOTE — Assessment & Plan Note (Signed)
Last vitamin D Lab Results  Component Value Date   VD25OH 16.81 (L) 05/23/2021   Low, to start oral replacement

## 2021-05-27 NOTE — Assessment & Plan Note (Signed)
Lab Results  Component Value Date   HGBA1C 5.3 05/23/2021   Stable, pt to continue current medical treatment  - diet

## 2021-05-30 ENCOUNTER — Ambulatory Visit: Payer: Self-pay

## 2021-06-01 ENCOUNTER — Ambulatory Visit: Payer: Self-pay

## 2021-06-01 ENCOUNTER — Ambulatory Visit: Payer: Medicare Other | Admitting: Physician Assistant

## 2021-06-06 ENCOUNTER — Ambulatory Visit (INDEPENDENT_AMBULATORY_CARE_PROVIDER_SITE_OTHER): Payer: Medicare Other

## 2021-06-06 DIAGNOSIS — J309 Allergic rhinitis, unspecified: Secondary | ICD-10-CM | POA: Diagnosis not present

## 2021-06-06 DIAGNOSIS — G43719 Chronic migraine without aura, intractable, without status migrainosus: Secondary | ICD-10-CM | POA: Diagnosis not present

## 2021-06-08 ENCOUNTER — Ambulatory Visit (INDEPENDENT_AMBULATORY_CARE_PROVIDER_SITE_OTHER): Payer: Medicare Other

## 2021-06-08 ENCOUNTER — Other Ambulatory Visit: Payer: Self-pay

## 2021-06-08 DIAGNOSIS — J4550 Severe persistent asthma, uncomplicated: Secondary | ICD-10-CM | POA: Diagnosis not present

## 2021-06-13 NOTE — Progress Notes (Signed)
VIALS MADE. EXP 06-13-22 

## 2021-06-14 DIAGNOSIS — J302 Other seasonal allergic rhinitis: Secondary | ICD-10-CM | POA: Diagnosis not present

## 2021-06-14 IMAGING — DX LEFT KNEE - COMPLETE 4+ VIEW
4 series · 4 of 4 positions shown · non-contrast
Comparison: 12/28/2018

CLINICAL DATA: Multiple falls most recently 2 weeks ago.
Generalized knee pain.

EXAM:
LEFT KNEE - COMPLETE 4+ VIEW

[knee ap]
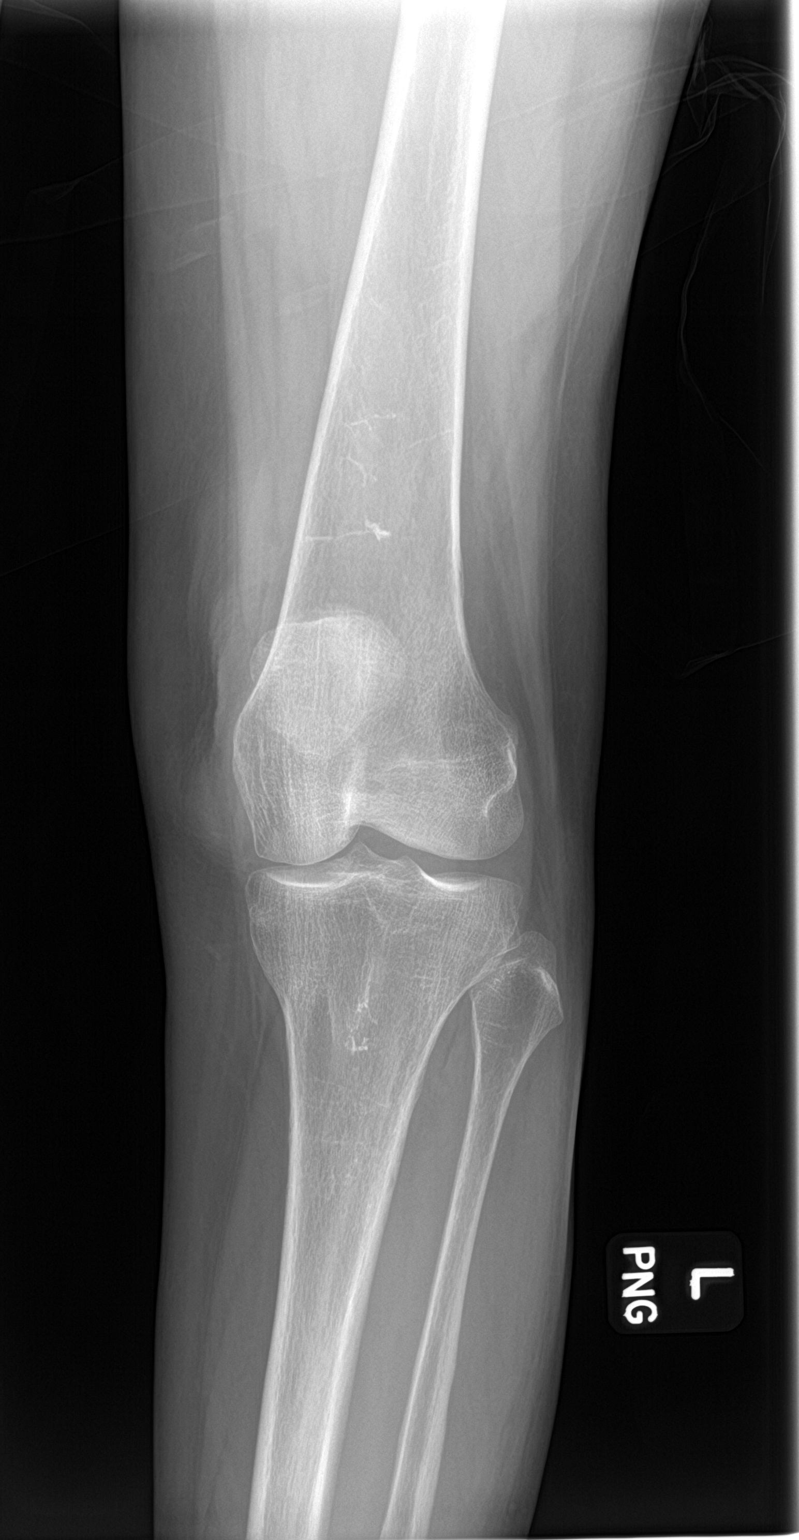

[knee lat]
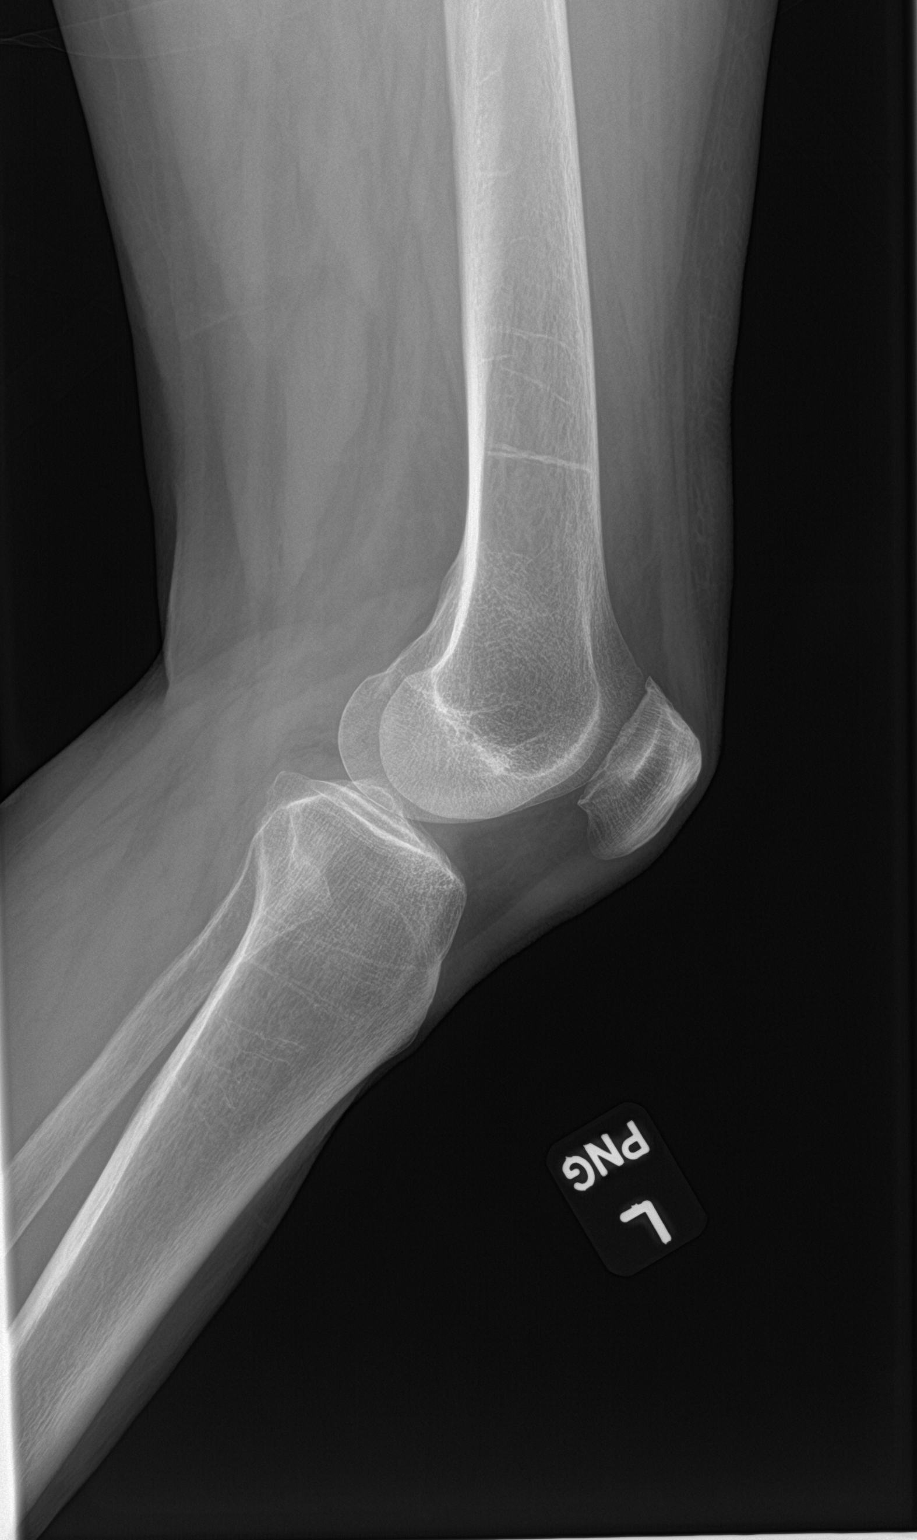

[knee obl (1 of 2)]
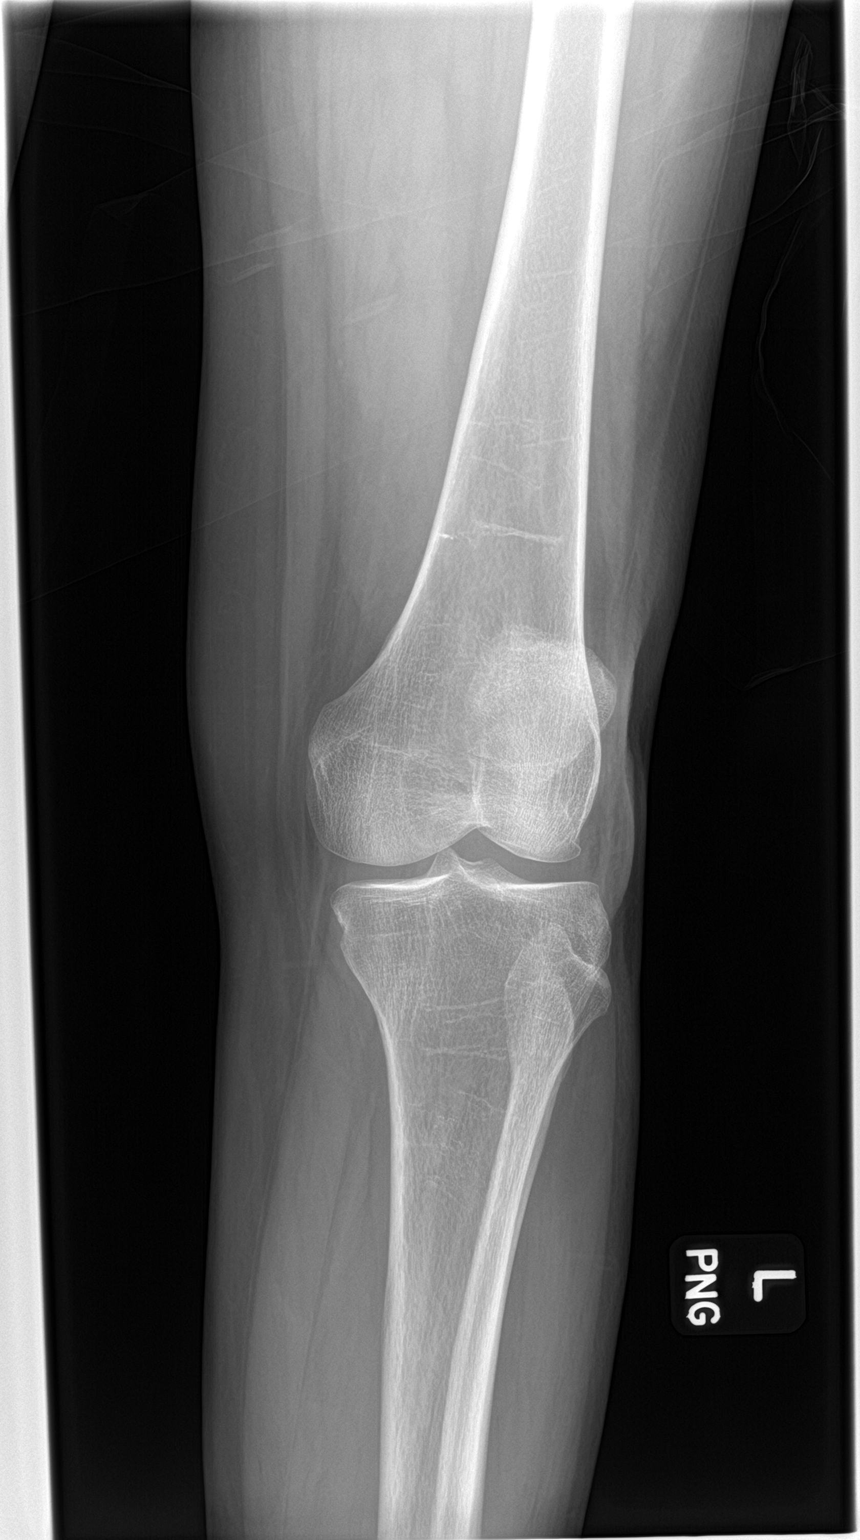

[knee obl (2 of 2)]
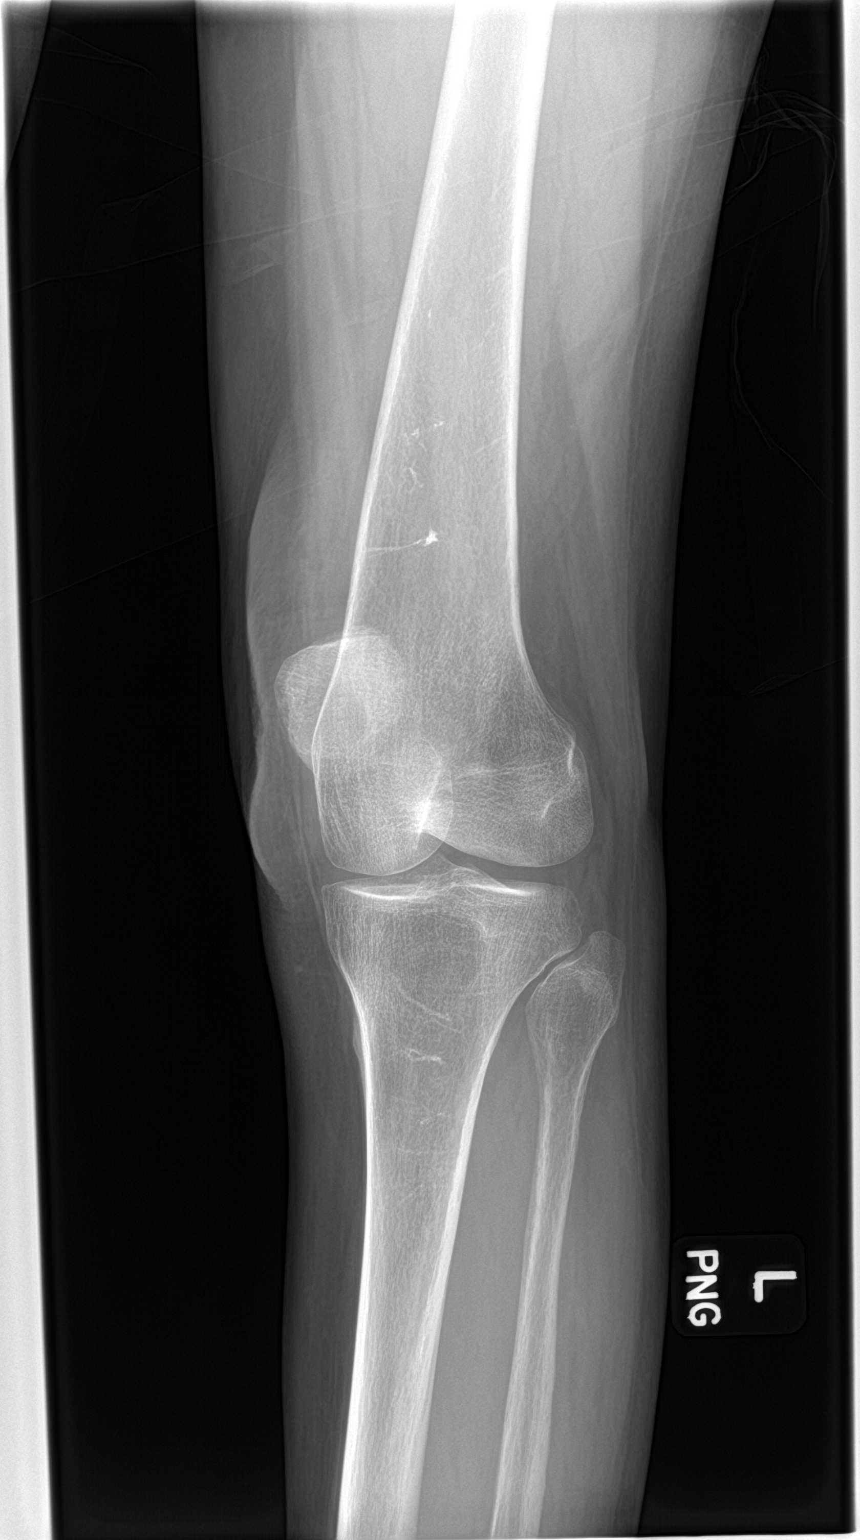

[4 of 4 positions shown; findings below may reference images not displayed]

FINDINGS: No visible effusion. No evidence of fracture. Patellofemoral
osteoarthritis. No other finding of note.
IMPRESSION: No acute or traumatic finding.  Patellofemoral osteoarthritis.

## 2021-06-15 DIAGNOSIS — J3081 Allergic rhinitis due to animal (cat) (dog) hair and dander: Secondary | ICD-10-CM | POA: Diagnosis not present

## 2021-06-19 ENCOUNTER — Other Ambulatory Visit: Payer: Self-pay | Admitting: Internal Medicine

## 2021-07-03 ENCOUNTER — Ambulatory Visit (INDEPENDENT_AMBULATORY_CARE_PROVIDER_SITE_OTHER): Payer: Medicare Other

## 2021-07-03 DIAGNOSIS — J309 Allergic rhinitis, unspecified: Secondary | ICD-10-CM

## 2021-07-04 ENCOUNTER — Ambulatory Visit: Payer: Medicare Other | Admitting: Cardiology

## 2021-07-09 DIAGNOSIS — G43719 Chronic migraine without aura, intractable, without status migrainosus: Secondary | ICD-10-CM | POA: Diagnosis not present

## 2021-07-13 ENCOUNTER — Telehealth: Payer: Self-pay | Admitting: *Deleted

## 2021-07-13 ENCOUNTER — Telehealth (INDEPENDENT_AMBULATORY_CARE_PROVIDER_SITE_OTHER): Payer: Medicare Other | Admitting: Cardiology

## 2021-07-13 ENCOUNTER — Encounter: Payer: Self-pay | Admitting: Cardiology

## 2021-07-13 VITALS — Ht 64.0 in | Wt 125.0 lb

## 2021-07-13 DIAGNOSIS — F17210 Nicotine dependence, cigarettes, uncomplicated: Secondary | ICD-10-CM

## 2021-07-13 DIAGNOSIS — I700 Atherosclerosis of aorta: Secondary | ICD-10-CM | POA: Diagnosis not present

## 2021-07-13 DIAGNOSIS — E785 Hyperlipidemia, unspecified: Secondary | ICD-10-CM | POA: Diagnosis not present

## 2021-07-13 DIAGNOSIS — R0789 Other chest pain: Secondary | ICD-10-CM

## 2021-07-13 DIAGNOSIS — F17200 Nicotine dependence, unspecified, uncomplicated: Secondary | ICD-10-CM | POA: Diagnosis not present

## 2021-07-13 DIAGNOSIS — Z720 Tobacco use: Secondary | ICD-10-CM | POA: Diagnosis not present

## 2021-07-13 DIAGNOSIS — J449 Chronic obstructive pulmonary disease, unspecified: Secondary | ICD-10-CM | POA: Diagnosis not present

## 2021-07-13 NOTE — Telephone Encounter (Signed)
RN left detail message to patient on voicemail. Instruction were given from today's virtual visit 07/13/21 .  AVS SUMMARY has been mailed.  Keep appt on 09/25/21  and also an appointment with lipid clinic at 09/25/21 at 11 am .    Any question may call back

## 2021-07-13 NOTE — Telephone Encounter (Signed)
  Patient Consent for Virtual Visit        [name] has provided verbal consent on 07/13/2021 for a virtual visit (video or telephone).   CONSENT FOR VIRTUAL VISIT FOR:  Lori [name]  By participating in this virtual visit I agree to the following:  I hereby voluntarily request, consent and authorize [facility] and its employed or contracted physicians, physician assistants, nurse practitioners or other licensed health care professionals (the Practitioner), to provide me with telemedicine health care services (the "Services") as deemed necessary by the treating Practitioner. I acknowledge and consent to receive the Services by the Practitioner via telemedicine. I understand that the telemedicine visit will involve communicating with the Practitioner through live audiovisual communication technology and the disclosure of certain medical information by electronic transmission. I acknowledge that I have been given the opportunity to request an in-person assessment or other available alternative prior to the telemedicine visit and am voluntarily participating in the telemedicine visit.  I understand that I have the right to withhold or withdraw my consent to the use of telemedicine in the course of my care at any time, without affecting my right to future care or treatment, and that the Practitioner or I may terminate the telemedicine visit at any time. I understand that I have the right to inspect all information obtained and/or recorded in the course of the telemedicine visit and may receive copies of available information for a reasonable fee.  I understand that some of the potential risks of receiving the Services via telemedicine include:  Delay or interruption in medical evaluation due to technological equipment failure or disruption; Information transmitted may not be sufficient (e.g. poor resolution of images) to allow for appropriate medical decision making by the Practitioner;  and/or  In rare instances, security protocols could fail, causing a breach of personal health information.  Furthermore, I acknowledge that it is my responsibility to provide information about my medical history, conditions and care that is complete and accurate to the best of my ability. I acknowledge that Practitioner's advice, recommendations, and/or decision may be based on factors not within their control, such as incomplete or inaccurate data provided by me or distortions of diagnostic images or specimens that may result from electronic transmissions. I understand that the practice of medicine is not an exact science and that Practitioner makes no warranties or guarantees regarding treatment outcomes. I acknowledge that a copy of this consent can be made available to me via my patient portal ([facility]), or I can request a printed copy by calling the office of [facility].    I understand that my insurance will be billed for this visit.   I have read or had this consent read to me. I understand the contents of this consent, which adequately explains the benefits and risks of the Services being provided via telemedicine.  I have been provided ample opportunity to ask questions regarding this consent and the Services and have had my questions answered to my satisfaction. I give my informed consent for the services to be provided through the use of telemedicine in my medical care

## 2021-07-13 NOTE — Progress Notes (Signed)
Virtual Visit via Telephone Note   This visit type was conducted due to national recommendations for restrictions regarding the COVID-19 Pandemic (e.g. social distancing) in an effort to limit this patient's exposure and mitigate transmission in our community.  Due to her co-morbid illnesses, this patient is at least at moderate risk for complications without adequate follow up.  This format is felt to be most appropriate for this patient at this time.  The patient did not have access to video technology/had technical difficulties with video requiring transitioning to audio format only (telephone).  All issues noted in this document were discussed and addressed.  No physical exam could be performed with this format.  Please refer to the patient's chart for her  consent to telehealth for [facility].   Patient has given verbal permission to conduct this visit via virtual appointment and to bill insurance 07/14/2021 10:21 PM     Evaluation Performed:  Follow-up visit  Date:  07/14/2021   ID:  Lori [name], DOB 1956/07/23, MRN [id number]  Patient Location: Home Provider Location: Home Office  PCP:  Biagio Borg, MD  Cardiologist:  Glenetta Hew, MD  Electrophysiologist:  None   Chief Complaint:   Chief Complaint  Patient presents with   Follow-up    Has questions about chest x-ray findings -> they told me I have a blockage     ====================================  ASSESSMENT & PLAN:    Problem List Items Addressed This Visit       Cardiology Problems   Hyperlipidemia LDL goal <100 (Chronic)    Lipids not at goal for having aortic atherosclerosis when cholesterol 297.  Triglycerides definitely elevated.  Probably needs direct treatment of triglycerides plus or minus more aggressive therapy than current dose of rosuvastatin 40 mg.  Previous evaluation with cardiac CTA was relatively normal.  Can reevaluate coronary calcium score to get new baseline.  She is concerned about  atherosclerosis seen on her thoracic aorta.  Smoking cessation counseling provided as well.  Will refer to CVRR clinic for additional aggressive management.      Relevant Orders   CT CARDIAC SCORING (SELF PAY ONLY)   Aortic atherosclerosis (HCC) - Primary (Chronic)    Rosuvastatin is been increased to 40 mg daily.  Follow-up labs did not show adequate control. Will check coronary calcium score to evaluate evidence of CAD.  Last evaluated in 2018 with normal coronary calcium score and normal Coronary CTA.  I explained in great detail what the presence of aortic atherosclerosis means.  I explained what atherosclerosis process is and that it is not unusual in someone her age is a smoker with hyperlipidemia to have aortic atherosclerosis.  There was no suggestion of dilation. -> Coronary calcium score will also look at the aorta for measurement of atherosclerosis as well.  Could consider AAA Doppler for baseline screening.  Elevated triglyceride levels. -> Will refer to lipid clinic to consider additional treatment options.        Other   Asthma-COPD overlap syndrome (HCC)   Relevant Medications   albuterol (PROVENTIL) (2.5 MG/3ML) 0.083% nebulizer solution   guaiFENesin (TUSSIN CHEST CONGESTION PO)   Tobacco abuse (Chronic)    I actually spent 5 to 10 minutes speaking about smoking cessation with her.  Her major risk factors for atherosclerotic disease whether to be in the aorta, carotids or in the coronaries other than her poorly controlled lipids is her smoking.  Both risk factors are not controlled, => I am referring her  to lipid clinic to get more aggressive in managing her lipids, but I need her to make the effort to fully quit smoking.  We talked about several options and how to do so.  She does not seem to be all that interested in doing this.  I am not sure how much she understands the risks involved with smoking.      Other Visit Diagnoses     Atypical chest pain        Relevant Orders   CT CARDIAC SCORING (SELF PAY ONLY)   Current smoker       Relevant Orders   CT CARDIAC SCORING (SELF PAY ONLY)       ====================================  History of Present Illness:    Lori [name] is a 65 y.o. female with PMH notable for COPD and cardiac risk factors who has had Multiple Recurrent Episodes of Chest Pain with COMPLETELY NEGATIVE CARDIAC EVALUATION (Negative Myoview stress tests, normal cardiac CTA)who presents via audio/telephone conferencing for a telehealth visit today as a 6-week follow-up to discuss results of recent CT scan.  I last saw her in May 2018 after her coronary calcium score.  At that time was felt that her symptoms were not cardiac in nature, and she was felt to be stable to follow-up with PCP only.  Hospitalizations:  7/13-18/2022 - Admitted to [facility]: Presented with chest pain and dyspnea accompanied by dizziness worsening over 3 weeks.  Mid sternal sharp chest pain along with several days of severe generalized weakness making it difficult to ambulate. Noted to have refractory nausea--unclear etiology. Chest pain was not thought to be related to unstable angina.  Normal echocardiogram.  Troponin levels noted to be 79-83 in the midst of essentially minimal levels.  (On review, lab error noted). AKI noted to be because of dehydration.  Poor p.o. intake due to depression.  Resolved with volume resuscitation. PT and OT suggested SNF placement but she refused.  Lori [name] was just seen on May 18, 2019 by Coletta Memos, NP for evaluation of hyperlipidemia with aortic atherosclerosis-as a follow-up from her hospitalization in July.  He clarified and that visit that the troponin levels were inaccurate as educated by the [employer].  He also noted that she complained less of chest pain during this visit and more of leg discomfort.  She is noticing weakness and pain in her legs.  She had 1 brief episode of chest  discomfort the day prior to the visit.  Remains relatively sedentary. -> ABIs ordered to assess for PAD. --> Despite extensive explanation by Mr. Cleaver, she called back to the office shortly after with multiple questions asking for clarification.  As such she was scheduled for a follow-up visit.   Recent - Interim CV studies:   The following studies were reviewed today: Transthoracic Echo (TTE)-03/28/2021: Normal EF 60 to 65%.  GR 1 DD.  No R WMA.  Normal RV size and function.  Normal RAP.  Normal valves. => NORMAL Coronary CTA 12/02/2016: Coronary calcium score 0.  No evidence of CAD.  Normal-sized PA.  Normal pulmonary venous drainage the left atrium.  No ASD or VSD. Chest x-ray 05/23/2021: No radiographic evidence of acute cardiopulmonary disease.  Aortic atherosclerosis. ABIs 05/25/2021: R ABI 1.08, R TBI 0.9; L ABI 1.14, L TBI 1.15  Inerval History   Lori [name] insisted on having another follow-up visit to discuss "Findings " with me, indicating that she still does not understand what was discussed  during her visit with Mr. Marilynn Rail.  She had questions about the studies done during her hospital stay.  She also had questions about Her recent chest x-ray ordered by her PCP.  She said that she was told that she has a blockage/clot.She indicated to me that she was told by her PCPs office that the chest x-ray showed that she has blockages in her aorta. Clearly, She was told that she has aortic atherosclerosis.  I explained to her that this is a finding suggested by aortic calcification, and cannot determine presence or significance of of atherosclerotic plaque, simply indicates the presence of of atherosclerotic disease.  She really does not have any change in symptoms.  She still has her intermittent Chest pain symptoms that come and go.  She also has baseline exertional dyspnea.  She is still very deconditioned, but continues to smoke.  We discussed the results of her ABIs that suggest no  evidence of PAD  Cardiovascular ROS: positive for - chest pain, dyspnea on exertion, and Generalized fatigue and malaise.  Baseline dyspnea, coughing and wheezing negative for - edema, irregular heartbeat, orthopnea, palpitations, paroxysmal nocturnal dyspnea, rapid heart rate, shortness of breath, or Syncope/near syncope or/amaurosis fugax.  Claudication.   ROS:  Please see the history of present illness.     Review of Systems  Constitutional:  Positive for malaise/fatigue. Negative for weight loss.  HENT:  Negative for congestion and nosebleeds.   Respiratory:  Positive for cough, shortness of breath and wheezing.   Cardiovascular:        Per HPI  Gastrointestinal:  Positive for abdominal pain and nausea. Negative for blood in stool and melena.  Genitourinary:  Negative for hematuria.  Musculoskeletal:  Positive for joint pain. Negative for falls.  Neurological:  Positive for dizziness and weakness (Generalized). Negative for focal weakness.  Psychiatric/Behavioral:  Positive for depression and memory loss. The patient is nervous/anxious.    Past Medical History:  Diagnosis Date   Allergic rhinitis    Anemia    Ankle fracture 05/2016   Anxiety    Arthritis    Asthma    Barrett esophagus 2007   Chest pain    Recurrent episodes.  Has had 3 negative nuclear stress test, and a completely normal coronary CT angiogram   COPD (chronic obstructive pulmonary disease) with chronic bronchitis (HCC)    & Emphysema   Depression    Duodenitis without mention of hemorrhage 2007   Esophageal reflux 2007   Esophageal stricture    Full dentures    H/O hiatal hernia    Hiatal hernia 2006,2007   Hyperlipidemia    Multiple fractures    from falls, fx rt. elbow, fx left wrist, bilateral ankles   Pneumonia 10/2016   Restrictive lung disease 06/30/2017   Ulcer    Past Surgical History:  Procedure Laterality Date   ABDOMINAL HYSTERECTOMY     BSO   APPENDECTOMY     CESAREAN SECTION     x  2   CHOLECYSTECTOMY     CHONDROPLASTY Left 01/06/2015   Procedure: CHONDROPLASTY;  Surgeon: Marybelle Killings, MD;  Location: [place];  Service: Orthopedics;  Laterality: Left;   COLONOSCOPY     CT CTA CORONARY W/CA SCORE W/CM &/OR WO/CM  12/02/2016   CORONARY CALCIUM SCORE 0.  NO EVIDENCE OF CAD.  Normal-sized PA.  Normal pulmonary venous drainage the left atrium.  No ASD or VSD.   ELBOW FRACTURE SURGERY     right  KNEE ARTHROSCOPY WITH EXCISION PLICA Left 50/05/3817   Procedure: KNEE ARTHROSCOPY WITH EXCISION PLICA;  Surgeon: Marybelle Killings, MD;  Location: [place];  Service: Orthopedics;  Laterality: Left;   Lower Extremity Arterial Dopplers  07/06/2013   RABI 1.0, LABI 1.1.; No evidence of significant vascular atherogenic plaque   NECK SURGERY     NM MYOVIEW LTD  09/2014   a) LOW RISK. Normal EF of 65%, no RWMA. NO ISCHEMIA OR INFARCTION.;; b) 05/02/2009: Normal study.  LOW RISK.  No ischemia or infarction.  EF 74%.  Breast attenuation noted.   TONSILLECTOMY     TRANSTHORACIC ECHOCARDIOGRAM  03/28/2021   a) Normal EF 60 to 65%.  GR 1 DD.  No R WMA.  Normal RV size and function.  Normal RAP.  Normal valves. => NORMAL;; b) 04/23/16/2010: Normal study.  LOW RISK.  No ischemia or infarction.  EF 74%.  Breast attenuation noted.17/2010: ([facility]) EF>55%.  Normal wall motion.  Normal valves.   WRIST FRACTURE SURGERY     left, has plate    Myoview Stress Test 07/02/2013: Hyperdynamic EF.  Normal LV function and wall motion.  No ischemia or infarction. Myoview stress test 05/02/2009: Normal study.  LOW RISK.  No ischemia or infarction.  EF 74%.  Breast attenuation noted. TTE 04/23/16/2010: Normal study.  LOW RISK.  No ischemia or infarction.  EF 74%.  Breast attenuation noted.17/2010: ([facility]) EF>55%.  Normal wall motion.  Normal valves.   Current Meds  Medication Sig   AIMOVIG 140 MG/ML SOAJ Inject 140 mg into the skin every 30 (thirty) days.   albuterol (PROVENTIL) (2.5  MG/3ML) 0.083% nebulizer solution Take 2.5 mg by nebulization every 6 (six) hours as needed for wheezing or shortness of breath.   ALPRAZolam (XANAX) 1 MG tablet TAKE 1 TABLET BY MOUTH AT BEDTIME AS NEEDED FOR ANXIETY   benzonatate (TESSALON PERLES) 100 MG capsule Take 2 capsules (200 mg total) by mouth 3 (three) times daily as needed for cough.   cetirizine (ZYRTEC) 10 MG tablet Take 1 tablet (10 mg total) by mouth daily.   cyanocobalamin 1000 MCG tablet Take 1 tablet (1,000 mcg total) by mouth daily.   cyclobenzaprine (FLEXERIL) 5 MG tablet Take 1 tablet (5 mg total) by mouth 3 (three) times daily as needed for muscle spasms.   DEXILANT 60 MG capsule Take 1 capsule by mouth once daily   EPINEPHrine 0.3 mg/0.3 mL IJ SOAJ injection INJECT AS DIRECTED FOR SEVERE ALLERGIC REACTION   FASENRA 30 MG/ML SOSY Inject into the skin. Every 8 weeks   fluticasone-salmeterol (ADVAIR HFA) 230-21 MCG/ACT inhaler Inhale 2 puffs into the lungs 2 (two) times daily.   folic acid (FOLVITE) 1 MG tablet Take 1 tablet (1 mg total) by mouth daily.   gabapentin (NEURONTIN) 300 MG capsule Take 300 mg by mouth 2 (two) times daily as needed.   guaiFENesin (TUSSIN CHEST CONGESTION PO) Take by mouth. Take as needed   meclizine (ANTIVERT) 25 MG tablet Take 1 tablet (25 mg total) by mouth 3 (three) times daily as needed for dizziness.   metoCLOPramide (REGLAN) 10 MG tablet Take by mouth.   mometasone-formoterol (DULERA) 200-5 MCG/ACT AERO Inhale 2 puffs into the lungs 2 (two) times daily.   montelukast (SINGULAIR) 10 MG tablet Take 1 tablet (10 mg total) by mouth at bedtime.   nitroGLYCERIN (NITROSTAT) 0.4 MG SL tablet Place 1 tablet (0.4 mg total) under the tongue every 5 (five) minutes as needed for chest pain.  rosuvastatin (CRESTOR) 40 MG tablet Take 1 tablet (40 mg total) by mouth daily.   triamcinolone (NASACORT) 55 MCG/ACT AERO nasal inhaler Place 2 sprays into the nose daily.   valACYclovir (VALTREX) 1000 MG tablet  Take 1 tablet by mouth twice daily   Current Facility-Administered Medications for the 07/13/21 encounter (Video Visit) with Leonie Man, MD  Medication   Benralizumab SOSY 30 mg     Allergies:   Bee venom, Codeine, Ibuprofen, Clonidine derivatives, Statins, Tramadol, Latex, Levofloxacin, and Propoxyphene n-acetaminophen   Social History   Tobacco Use   Smoking status: Every Day    Packs/day: 0.50    Years: 39.00    Pack years: 19.50    Types: Cigarettes    Start date: 08/06/1977   Smokeless tobacco: Never  Vaping Use   Vaping Use: Never used  Substance Use Topics   Alcohol use: No    Alcohol/week: 0.0 standard drinks   Drug use: No     Family Hx: The patient's family history includes Dementia in her mother; Diabetes in her maternal aunt, maternal grandmother, and another family member; Emphysema in her father; Heart disease in her brother. There is no history of Colon cancer, Allergic rhinitis, Angioedema, Asthma, Atopy, Eczema, Immunodeficiency, or Urticaria.   Labs/Other Tests and Data Reviewed:    EKG:  No ECG reviewed.  Recent Labs: 03/28/2021: TSH 1.028 04/02/2021: Magnesium 2.1 05/23/2021: ALT 8; BUN 7; Creatinine, Ser 0.78; Hemoglobin 13.9; Platelets 269.0; Potassium 3.6; Sodium 139   Recent Lipid Panel Lab Results  Component Value Date   CHOL 297 (H) 05/23/2021   HDL 36.10 (L) 05/23/2021   LDLCALC Comment 03/12/2017   LDLDIRECT 212.0 05/23/2021   TRIG (H) 05/23/2021    434.0 Triglyceride is over 400; calculations on Lipids are invalid.   CHOLHDL 8 05/23/2021    Wt Readings from Last 3 Encounters:  07/13/21 125 lb (56.7 kg)  05/23/21 127 lb (57.6 kg)  05/17/21 125 lb (56.7 kg)     Objective:    Vital Signs:  Ht 5\' 4"  (1.626 m)   Wt 125 lb (56.7 kg)   BMI 21.46 kg/m   VITAL SIGNS:  reviewed GEN:  no acute distress RESPIRATORY:  Normal respiratory effort PSYCH:  Very poor historian, poor insight and understanding.  Seems to be somewhat  depressed.   ==========================================  COVID-19 Education: The signs and symptoms of COVID-19 were discussed with the patient and how to seek care for testing (follow up with PCP or arrange E-visit).   The importance of social distancing was discussed today.  Time:   Today, I have spent 25 minutes with the patient with telehealth technology discussing the above problems.   An additional 25 minutes spent charting (reviewing prior notes, hospital records, studies, labs etc.) Total 50 minutes   Medication Adjustments/Labs and Tests Ordered: Current medicines are reviewed at length with the patient today.  Concerns regarding medicines are outlined above.   Patient Instructions  Medication Instructions:  -- No new Meds -- but will refer to Lipid Clinic (? Can schedule to be seen same day as f/u appt)    Lab Work: Per pcp     Testing/Procedures: Recommend Screening Coronary Calcium Score -- but may have financial concerns.  CT coronary calcium score.  This test is done at [address]. This is $99 out of pocket.  Follow-Up: At [facility], you and your health needs are our priority.  As part of our continuing mission to  provide you with exceptional heart care, we have created designated Provider Care Teams.  These Care Teams include your primary Cardiologist (physician) and Advanced Practice Providers (APPs -  Physician Assistants and Nurse Practitioners) who all work together to provide you with the care you need, when you need it.  We recommend signing up for the patient portal called "MyChart".  Sign up information is provided on this After Visit Summary.  MyChart is used to connect with patients for Virtual Visits (Telemedicine).  Patients are able to view lab/test results, encounter notes, upcoming appointments, etc.  Non-urgent messages can be sent to your provider as well.   To learn more about what you can do with MyChart, go to  [email or web address].    Your next appointment:   2 month(s) - as scheduled  The format for your next appointment:   In Person  Provider:   Glenetta Hew    Other Instructions STOP SMOKING   Signed, Glenetta Hew, MD  07/14/2021 10:21 PM    [place]

## 2021-07-13 NOTE — Patient Instructions (Addendum)
Medication Instructions:  -- No new Meds -- but will refer to Lipid Clinic (? Can schedule to be seen same day as f/u appt)    Lab Work: Per pcp     Testing/Procedures: Recommend Screening Coronary Calcium Score -- but may have financial concerns.  CT coronary calcium score.  This test is done at [address]. This is $99 out of pocket.   Coronary CalciumScan A coronary calcium scan is an imaging test used to look for deposits of calcium and other fatty materials (plaques) in the inner lining of the blood vessels of the heart (coronary arteries). These deposits of calcium and plaques can partly clog and narrow the coronary arteries without producing any symptoms or warning signs. This puts a person at risk for a heart attack. This test can detect these deposits before symptoms develop. Tell a health care provider about: Any allergies you have. All medicines you are taking, including vitamins, herbs, eye drops, creams, and over-the-counter medicines. Any problems you or family members have had with anesthetic medicines. Any blood disorders you have. Any surgeries you have had. Any medical conditions you have. Whether you are pregnant or may be pregnant. What are the risks? Generally, this is a safe procedure. However, problems may occur, including: Harm to a pregnant woman and her unborn baby. This test involves the use of radiation. Radiation exposure can be dangerous to a pregnant woman and her unborn baby. If you are pregnant, you generally should not have this procedure done. Slight increase in the risk of cancer. This is because of the radiation involved in the test. What happens before the procedure? No preparation is needed for this procedure. What happens during the procedure? You will undress and remove any jewelry around your neck or chest. You will put on a hospital gown. Sticky electrodes will be placed on your chest. The electrodes will be connected  to an electrocardiogram (ECG) machine to record a tracing of the electrical activity of your heart. A CT scanner will take pictures of your heart. During this time, you will be asked to lie still and hold your breath for 2-3 seconds while a picture of your heart is being taken. The procedure may vary among health care providers and hospitals. What happens after the procedure? You can get dressed. You can return to your normal activities. It is up to you to get the results of your test. Ask your health care provider, or the department that is doing the test, when your results will be ready. Summary A coronary calcium scan is an imaging test used to look for deposits of calcium and other fatty materials (plaques) in the inner lining of the blood vessels of the heart (coronary arteries). Generally, this is a safe procedure. Tell your health care provider if you are pregnant or may be pregnant. No preparation is needed for this procedure. A CT scanner will take pictures of your heart. You can return to your normal activities after the scan is done. This information is not intended to replace advice given to you by your health care provider. Make sure you discuss any questions you have with your health care provider. Document Released: 02/29/2008 Document Revised: 07/22/2016 Document Reviewed: 07/22/2016 Elsevier Interactive Patient Education  2017 [place]: At [facility], you and your health needs are our priority.  As part of our continuing mission to provide you with exceptional heart care, we have created designated Provider Care Teams.  These Care Teams include your primary Cardiologist (physician) and Advanced Practice Providers (APPs -  Physician Assistants and Nurse Practitioners) who all work together to provide you with the care you need, when you need it.  We recommend signing up for the patient portal called "MyChart".  Sign up information is provided on this After  Visit Summary.  MyChart is used to connect with patients for Virtual Visits (Telemedicine).  Patients are able to view lab/test results, encounter notes, upcoming appointments, etc.  Non-urgent messages can be sent to your provider as well.   To learn more about what you can do with MyChart, go to [email or web address].    Your next appointment:   2 month(s) - as scheduled  The format for your next appointment:   In Person  Provider:   Glenetta Hew    Other Instructions STOP SMOKING

## 2021-07-14 ENCOUNTER — Encounter: Payer: Self-pay | Admitting: Cardiology

## 2021-07-14 NOTE — Assessment & Plan Note (Signed)
I actually spent 5 to 10 minutes speaking about smoking cessation with her.  Her major risk factors for atherosclerotic disease whether to be in the aorta, carotids or in the coronaries other than her poorly controlled lipids is her smoking.  Both risk factors are not controlled, => I am referring her to lipid clinic to get more aggressive in managing her lipids, but I need her to make the effort to fully quit smoking.  We talked about several options and how to do so.  She does not seem to be all that interested in doing this.  I am not sure how much she understands the risks involved with smoking.

## 2021-07-14 NOTE — Assessment & Plan Note (Signed)
Lipids not at goal for having aortic atherosclerosis when cholesterol 297.  Triglycerides definitely elevated.  Probably needs direct treatment of triglycerides plus or minus more aggressive therapy than current dose of rosuvastatin 40 mg.  Previous evaluation with cardiac CTA was relatively normal.  Can reevaluate coronary calcium score to get new baseline.  She is concerned about atherosclerosis seen on her thoracic aorta.  Smoking cessation counseling provided as well.  Will refer to CVRR clinic for additional aggressive management.

## 2021-07-14 NOTE — Assessment & Plan Note (Addendum)
Rosuvastatin is been increased to 40 mg daily.  Follow-up labs did not show adequate control. Will check coronary calcium score to evaluate evidence of CAD.  Last evaluated in 2018 with normal coronary calcium score and normal Coronary CTA.  I explained in great detail what the presence of aortic atherosclerosis means.  I explained what atherosclerosis process is and that it is not unusual in someone her age is a smoker with hyperlipidemia to have aortic atherosclerosis.  There was no suggestion of dilation. -> Coronary calcium score will also look at the aorta for measurement of atherosclerosis as well.  Could consider AAA Doppler for baseline screening.  Elevated triglyceride levels. -> Will refer to lipid clinic to consider additional treatment options.

## 2021-07-30 ENCOUNTER — Other Ambulatory Visit: Payer: Self-pay | Admitting: Internal Medicine

## 2021-07-31 ENCOUNTER — Ambulatory Visit (INDEPENDENT_AMBULATORY_CARE_PROVIDER_SITE_OTHER): Payer: Medicare Other | Admitting: *Deleted

## 2021-07-31 DIAGNOSIS — J309 Allergic rhinitis, unspecified: Secondary | ICD-10-CM | POA: Diagnosis not present

## 2021-08-02 ENCOUNTER — Ambulatory Visit (INDEPENDENT_AMBULATORY_CARE_PROVIDER_SITE_OTHER): Payer: Medicare Other | Admitting: *Deleted

## 2021-08-02 ENCOUNTER — Other Ambulatory Visit: Payer: Self-pay

## 2021-08-02 DIAGNOSIS — J4550 Severe persistent asthma, uncomplicated: Secondary | ICD-10-CM

## 2021-08-03 ENCOUNTER — Ambulatory Visit: Payer: Medicare Other

## 2021-08-08 ENCOUNTER — Telehealth: Payer: Self-pay | Admitting: *Deleted

## 2021-08-08 DIAGNOSIS — G43719 Chronic migraine without aura, intractable, without status migrainosus: Secondary | ICD-10-CM | POA: Diagnosis not present

## 2021-08-08 NOTE — Telephone Encounter (Signed)
L/m for patient to contact office for MD appt for [facility]

## 2021-08-11 ENCOUNTER — Other Ambulatory Visit: Payer: Self-pay | Admitting: Internal Medicine

## 2021-08-11 NOTE — Telephone Encounter (Signed)
Please refill as per office routine med refill policy (all routine meds to be refilled for 3 mo or monthly (per pt preference) up to one year from last visit, then month to month grace period for 3 mo, then further med refills will have to be denied) ? ?

## 2021-08-15 ENCOUNTER — Other Ambulatory Visit: Payer: Self-pay | Admitting: Internal Medicine

## 2021-08-24 ENCOUNTER — Ambulatory Visit (HOSPITAL_COMMUNITY)
Admission: EM | Admit: 2021-08-24 | Discharge: 2021-08-24 | Disposition: A | Discharge status: Home / Self Care | Payer: Medicare Other | Attending: Emergency Medicine | Admitting: Emergency Medicine

## 2021-08-24 ENCOUNTER — Other Ambulatory Visit: Payer: Self-pay

## 2021-08-24 ENCOUNTER — Ambulatory Visit (INDEPENDENT_AMBULATORY_CARE_PROVIDER_SITE_OTHER): Payer: Medicare Other

## 2021-08-24 ENCOUNTER — Encounter (HOSPITAL_COMMUNITY): Payer: Self-pay | Admitting: *Deleted

## 2021-08-24 DIAGNOSIS — R0789 Other chest pain: Secondary | ICD-10-CM

## 2021-08-24 DIAGNOSIS — S299XXA Unspecified injury of thorax, initial encounter: Secondary | ICD-10-CM | POA: Diagnosis not present

## 2021-08-24 DIAGNOSIS — Z9049 Acquired absence of other specified parts of digestive tract: Secondary | ICD-10-CM | POA: Diagnosis not present

## 2021-08-24 DIAGNOSIS — I700 Atherosclerosis of aorta: Secondary | ICD-10-CM | POA: Diagnosis not present

## 2021-08-24 MED ORDER — CYCLOBENZAPRINE HCL 5 MG PO TABS: 5.0000 mg | ORAL_TABLET | Freq: Three times a day (TID) | ORAL | 0 refills | Status: DC | PRN

## 2021-08-24 MED ORDER — KETOROLAC TROMETHAMINE 30 MG/ML IJ SOLN
INTRAMUSCULAR | Status: AC
  Filled 2021-08-24: qty 1

## 2021-08-24 MED ORDER — MELOXICAM 7.5 MG PO TABS: 7.5000 mg | ORAL_TABLET | Freq: Every day | ORAL | 0 refills | Status: DC

## 2021-08-24 MED ORDER — KETOROLAC TROMETHAMINE 30 MG/ML IJ SOLN
30.0000 mg | Freq: Once | INTRAMUSCULAR | Status: AC
  Administered 2021-08-24: 30 mg via INTRAMUSCULAR

## 2021-08-24 NOTE — ED Provider Notes (Signed)
[place]    CSN: [id number] Arrival date & time: 08/24/21  1234      History   Chief Complaint Chief Complaint  Patient presents with   RT rib pain    HPI Lori [name] is a 65 y.o. female.  Patient reports 1-1/2 weeks ago she was bending down to pull close out of the washer machine when she felt a pop in her lower anterior right rib area.  She said it was so painful it took her breath away.  It has been painful ever since.  It hurts to cough, sneeze, take a deep breath.  Denies shortness of breath, fever.  HPI  Past Medical History:  Diagnosis Date   Allergic rhinitis    Anemia    Ankle fracture 05/2016   Anxiety    Arthritis    Asthma    Barrett esophagus 2007   Chest pain    Recurrent episodes.  Has had 3 negative nuclear stress test, and a completely normal coronary CT angiogram   COPD (chronic obstructive pulmonary disease) with chronic bronchitis (HCC)    & Emphysema   Depression    Duodenitis without mention of hemorrhage 2007   Esophageal reflux 2007   Esophageal stricture    Full dentures    H/O hiatal hernia    Hiatal hernia 2006,2007   Hyperlipidemia    Multiple fractures    from falls, fx rt. elbow, fx left wrist, bilateral ankles   Pneumonia 10/2016   Restrictive lung disease 06/30/2017   Ulcer     Patient Active Problem List   Diagnosis Date Noted   Neck pain 04/11/2021   Thoracic spine pain 04/11/2021   Low back pain 04/11/2021   Bilateral hip pain 04/11/2021   AKI (acute kidney injury) ([place]) 04/11/2021   Allergic rhinitis 01/08/2021   Abnormal urine odor 11/14/2020   Aortic atherosclerosis ([place]) 11/14/2020   Severe persistent asthma without complication 19/14/7829   Hymenoptera allergy 11/02/2020   Genital herpes 11/12/2019   Coccyx pain 11/12/2019   Insomnia 11/12/2019   Acute right-sided low back pain without sciatica 07/20/2019   Grief 07/20/2019   Vitamin D deficiency 07/20/2019   Hyperglycemia 04/21/2019   Left  knee pain 04/21/2019   Pain 12/29/2018   B12 deficiency 10/20/2018   Bilateral hand pain 08/03/2018   Nausea 04/27/2018   Anemia, iron deficiency 04/27/2018   Intermittent paresthesia of hand and foot 04/27/2018   Headache 03/21/2018   Lumbar radiculitis 08/15/2017   Degenerative disc disease, lumbar 07/21/2017   Encounter for well adult exam with abnormal findings 06/30/2017   Epigastric pain 06/30/2017   Restrictive lung disease 06/30/2017   Leg cramping 06/09/2017   Elbow contusion 06/09/2017   Right elbow pain 05/27/2017   Asthma-COPD overlap syndrome ([employer]) 01/31/2017   Seasonal and perennial allergic rhinitis 01/01/2017   Moderate persistent asthma, uncomplicated 56/21/3086   Pulmonary emphysema ([place]) 12/03/2016   Tobacco abuse 12/03/2016   Non-seasonal allergic rhinitis due to fungal spores 12/03/2016   Medication management 11/29/2016   Rash 11/23/2016   Vertigo 10/22/2016   Gastroesophageal reflux disease with esophagitis 10/22/2016   Non compliance w medication regimen 08/01/2016   Allergic contact dermatitis due to metals 08/01/2016   Migraine without aura and without status migrainosus, not intractable 07/22/2016   Spasm of muscle of lower back 06/21/2016   Chronic rhinitis 06/19/2016   Compliance poor 06/19/2016   Gait difficulty 06/21/2015   Fibromyalgia 06/21/2015   Conversion reaction 06/21/2015  Depression 06/21/2015   Syncope and collapse 47/82/9562   Plica syndrome of left knee 01/06/2015   Anxiety state 09/06/2014   History of MI 09/06/2014   Cigarette smoker 06/27/2013   Borderline hypertension 06/27/2013   Chronic low back pain with sciatica 06/14/2013   Leg pain, bilateral 06/14/2013   Gastroparesis 10/26/2009   Musculoskeletal chest pain 05/02/2009   Hyperlipidemia LDL goal <100 11/18/2008   COPD exacerbation ([place]) 11/18/2008   HEPATIC CYST 11/18/2008    Past Surgical History:  Procedure Laterality Date   ABDOMINAL HYSTERECTOMY     BSO    APPENDECTOMY     CESAREAN SECTION     x 2   CHOLECYSTECTOMY     CHONDROPLASTY Left 01/06/2015   Procedure: CHONDROPLASTY;  Surgeon: Marybelle Killings, MD;  Location: [place];  Service: Orthopedics;  Laterality: Left;   COLONOSCOPY     CT CTA CORONARY W/CA SCORE W/CM &/OR WO/CM  12/02/2016   CORONARY CALCIUM SCORE 0.  NO EVIDENCE OF CAD.  Normal-sized PA.  Normal pulmonary venous drainage the left atrium.  No ASD or VSD.   ELBOW FRACTURE SURGERY     right   KNEE ARTHROSCOPY WITH EXCISION PLICA Left 13/04/6577   Procedure: KNEE ARTHROSCOPY WITH EXCISION PLICA;  Surgeon: Marybelle Killings, MD;  Location: [place];  Service: Orthopedics;  Laterality: Left;   Lower Extremity Arterial Dopplers  07/06/2013   RABI 1.0, LABI 1.1.; No evidence of significant vascular atherogenic plaque   NECK SURGERY     NM MYOVIEW LTD  09/2014   a) LOW RISK. Normal EF of 65%, no RWMA. NO ISCHEMIA OR INFARCTION.;; b) 05/02/2009: Normal study.  LOW RISK.  No ischemia or infarction.  EF 74%.  Breast attenuation noted.   TONSILLECTOMY     TRANSTHORACIC ECHOCARDIOGRAM  03/28/2021   a) Normal EF 60 to 65%.  GR 1 DD.  No R WMA.  Normal RV size and function.  Normal RAP.  Normal valves. => NORMAL;; b) 04/23/16/2010: Normal study.  LOW RISK.  No ischemia or infarction.  EF 74%.  Breast attenuation noted.17/2010: ([facility]) EF>55%.  Normal wall motion.  Normal valves.   WRIST FRACTURE SURGERY     left, has plate    OB History   No obstetric history on file.      Home Medications    Prior to Admission medications   Medication Sig Start Date End Date Taking? Authorizing Provider  meloxicam (MOBIC) 7.5 MG tablet Take 1 tablet (7.5 mg total) by mouth daily. 08/24/21  Yes Carvel Getting, NP  AIMOVIG 140 MG/ML SOAJ Inject 140 mg into the skin every 30 (thirty) days. 01/09/21   [provider]  albuterol (PROVENTIL) (2.5 MG/3ML) 0.083% nebulizer solution Take 2.5 mg by nebulization every 6 (six)  hours as needed for wheezing or shortness of breath.    [provider]  ALPRAZolam Duanne Moron) 1 MG tablet TAKE 1 TABLET BY MOUTH AT BEDTIME AS NEEDED FOR ANXIETY 06/19/21   Biagio Borg, MD  benzonatate (TESSALON PERLES) 100 MG capsule Take 2 capsules (200 mg total) by mouth 3 (three) times daily as needed for cough. 05/23/21   Biagio Borg, MD  cetirizine (ZYRTEC) 10 MG tablet Take 1 tablet (10 mg total) by mouth daily. 11/02/20   Dara Hoyer, FNP  cyanocobalamin 1000 MCG tablet Take 1 tablet (1,000 mcg total) by mouth daily. 04/11/21   Biagio Borg, MD  cyclobenzaprine (FLEXERIL) 5 MG tablet  Take 1 tablet (5 mg total) by mouth 3 (three) times daily as needed. for muscle spams 08/24/21   Carvel Getting, NP  DEXILANT 60 MG capsule Take 1 capsule by mouth once daily 08/13/21   Biagio Borg, MD  EPINEPHrine 0.3 mg/0.3 mL IJ SOAJ injection INJECT AS DIRECTED FOR SEVERE ALLERGIC REACTION 11/02/20   Ambs, Kathrine Cords, FNP  FASENRA 30 MG/ML SOSY Inject into the skin. Every 8 weeks 03/22/21   [provider]  fluticasone-salmeterol (ADVAIR HFA) 230-21 MCG/ACT inhaler Inhale 2 puffs into the lungs 2 (two) times daily. 01/01/21   Dara Hoyer, FNP  folic acid (FOLVITE) 1 MG tablet Take 1 tablet (1 mg total) by mouth daily. 04/03/21   Cherene Altes, MD  gabapentin (NEURONTIN) 300 MG capsule Take 300 mg by mouth 2 (two) times daily as needed. 07/13/20   [provider]  guaiFENesin (TUSSIN CHEST CONGESTION PO) Take by mouth. Take as needed    [provider]  levalbuterol (XOPENEX HFA) 45 MCG/ACT inhaler Inhale 2 puffs into the lungs as directed. Patient not taking: Reported on 07/13/2021 11/02/20   Dara Hoyer, FNP  meclizine (ANTIVERT) 25 MG tablet Take 1 tablet (25 mg total) by mouth 3 (three) times daily as needed for dizziness. 01/02/21   Biagio Borg, MD  metoCLOPramide (REGLAN) 10 MG tablet Take by mouth. 05/09/21   [provider]  mometasone-formoterol (DULERA)  200-5 MCG/ACT AERO Inhale 2 puffs into the lungs 2 (two) times daily. 11/02/20   Dara Hoyer, FNP  montelukast (SINGULAIR) 10 MG tablet Take 1 tablet (10 mg total) by mouth at bedtime. 11/02/20   Dara Hoyer, FNP  NARCAN 4 MG/0.1ML LIQD nasal spray kit  11/12/19   [provider]  nitroGLYCERIN (NITROSTAT) 0.4 MG SL tablet Place 1 tablet (0.4 mg total) under the tongue every 5 (five) minutes as needed for chest pain. 05/17/21   Deberah Pelton, NP  rosuvastatin (CRESTOR) 40 MG tablet Take 1 tablet (40 mg total) by mouth daily. 11/15/20   Biagio Borg, MD  triamcinolone (NASACORT) 55 MCG/ACT AERO nasal inhaler Place 2 sprays into the nose daily. 01/02/21   Biagio Borg, MD  valACYclovir Estell Harpin) 1000 MG tablet Take 1 tablet by mouth twice daily 07/30/21   Biagio Borg, MD    Family History Family History  Problem Relation Age of Onset   Emphysema Father    Dementia Mother    Diabetes Maternal Grandmother    Diabetes Maternal Aunt    Diabetes Other        mat. cousin   Heart disease Brother    Colon cancer Neg Hx    Allergic rhinitis Neg Hx    Angioedema Neg Hx    Asthma Neg Hx    Atopy Neg Hx    Eczema Neg Hx    Immunodeficiency Neg Hx    Urticaria Neg Hx     Social History Social History   Tobacco Use   Smoking status: Every Day    Packs/day: 0.50    Years: 39.00    Pack years: 19.50    Types: Cigarettes    Start date: 08/06/1977   Smokeless tobacco: Never  Vaping Use   Vaping Use: Never used  Substance Use Topics   Alcohol use: No    Alcohol/week: 0.0 standard drinks   Drug use: No     Allergies   Bee venom, Codeine, Ibuprofen, Clonidine derivatives, Statins, Tramadol, Latex,  Levofloxacin, and Propoxyphene n-acetaminophen   Review of Systems Review of Systems   Physical Exam Triage Vital Signs ED Triage Vitals  Enc Vitals Group     BP 08/24/21 1422 134/75     Pulse Rate 08/24/21 1422 63     Resp 08/24/21 1422 18     Temp 08/24/21 1422 99 F  (37.2 C)     Temp src --      SpO2 08/24/21 1422 97 %     Weight --      Height --      Head Circumference --      Peak Flow --      Pain Score 08/24/21 1420 10     Pain Loc --      Pain Edu? --      Excl. in [place]? --    No data found.  Updated Vital Signs BP 134/75   Pulse 63   Temp 99 F (37.2 C)   Resp 18   SpO2 97%   Visual Acuity Right Eye Distance:   Left Eye Distance:   Bilateral Distance:    Right Eye Near:   Left Eye Near:    Bilateral Near:     Physical Exam Constitutional:      Appearance: Normal appearance.     Comments: Appears uncomfortable.  Moves slowly, splinting her right lower anterior ribs.  Pulmonary:     Effort: Pulmonary effort is normal.     Breath sounds: Normal breath sounds.  Chest:     Chest wall: Tenderness present. No deformity.    Neurological:     Mental Status: She is alert.     UC Treatments / Results  Labs (all labs ordered are listed, but only abnormal results are displayed) Labs Reviewed - No data to display  EKG   Radiology DG Ribs Unilateral W/Chest Right  Result Date: 08/24/2021 CLINICAL DATA:  Twisting injury with painful lower right ribs. EXAM: RIGHT RIBS AND CHEST - 3+ VIEW COMPARISON:  05/23/2021 FINDINGS: Frontal view of the chest and three views of right ribs. Cervical spine fixation. Midline trachea. Normal heart size. Atherosclerosis in the transverse aorta. 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 No pleural effusion or pneumothorax.  Clear lungs. Cholecystectomy. Rib radiographs demonstrate no acute displaced fracture. There may be deformities of mid lateral right ribs indicative of remote trauma. Example sixth and seventh lateral right ribs on the first rib radiograph. IMPRESSION: No acute rib fracture, pleural fluid, or pneumothorax. Possible remote right rib fractures. Aortic Atherosclerosis (ICD10-I70.0). Electronically Signed   By: Abigail Miyamoto M.D.   On: 08/24/2021 14:56     Procedures Procedures (including critical care time)  Medications Ordered in UC Medications  ketorolac (TORADOL) 30 MG/ML injection 30 mg (has no administration in time range)    Initial Impression / Assessment and Plan / UC Course  I have reviewed the triage vital signs and the nursing notes.  Pertinent labs & imaging results that were available during my care of the patient were reviewed by me and considered in my medical decision making (see chart for details).    No acute fx. Pt's allergies list ibuprofen causes nausea and vomiting but pt tells me she has been taking advil for her pain without relief. Reports ibuprofen upsets her stomach. Med history shows pt had ketorolac last July. Rx meloxicam and cyclobenzaprine for pain. Pt to f/u with PCP if needed.   Final Clinical Impressions(s) / UC Diagnoses  Final diagnoses:  Acute chest wall pain     Discharge Instructions      Take meloxicam with food.   Follow up with Dr. Jenny Reichmann if you continue to have pain.      ED Prescriptions     Medication Sig Dispense Auth. Provider   cyclobenzaprine (FLEXERIL) 5 MG tablet Take 1 tablet (5 mg total) by mouth 3 (three) times daily as needed. for muscle spams 30 tablet Carvel Getting, NP   meloxicam (MOBIC) 7.5 MG tablet Take 1 tablet (7.5 mg total) by mouth daily. 14 tablet Carvel Getting, NP      PDMP not reviewed this encounter.   Carvel Getting, NP 08/24/21 1520

## 2021-08-24 NOTE — Discharge Instructions (Addendum)
Take meloxicam with food.   Follow up with Dr. Jenny Reichmann if you continue to have pain.

## 2021-08-24 NOTE — ED Triage Notes (Signed)
Pt reports a pop in Rt rib area since last Monday.

## 2021-08-27 ENCOUNTER — Ambulatory Visit: Payer: Medicare Other | Admitting: Family

## 2021-08-28 ENCOUNTER — Ambulatory Visit: Payer: Medicare Other | Admitting: Internal Medicine

## 2021-08-28 ENCOUNTER — Ambulatory Visit (INDEPENDENT_AMBULATORY_CARE_PROVIDER_SITE_OTHER): Payer: Medicare Other | Admitting: *Deleted

## 2021-08-28 DIAGNOSIS — J309 Allergic rhinitis, unspecified: Secondary | ICD-10-CM | POA: Diagnosis not present

## 2021-09-03 ENCOUNTER — Other Ambulatory Visit: Payer: Self-pay | Admitting: Allergy & Immunology

## 2021-09-03 NOTE — Telephone Encounter (Signed)
Please advise for fasenra refill.

## 2021-09-04 ENCOUNTER — Other Ambulatory Visit: Payer: Self-pay | Admitting: Internal Medicine

## 2021-09-05 ENCOUNTER — Encounter: Payer: Self-pay | Admitting: Internal Medicine

## 2021-09-05 ENCOUNTER — Ambulatory Visit (INDEPENDENT_AMBULATORY_CARE_PROVIDER_SITE_OTHER): Payer: Medicare Other | Admitting: Internal Medicine

## 2021-09-05 ENCOUNTER — Ambulatory Visit (INDEPENDENT_AMBULATORY_CARE_PROVIDER_SITE_OTHER): Payer: Medicare Other

## 2021-09-05 ENCOUNTER — Other Ambulatory Visit: Payer: Self-pay

## 2021-09-05 VITALS — BP 130/70 | HR 69 | Resp 18 | Ht 64.0 in | Wt 122.2 lb

## 2021-09-05 DIAGNOSIS — R079 Chest pain, unspecified: Secondary | ICD-10-CM

## 2021-09-05 DIAGNOSIS — J449 Chronic obstructive pulmonary disease, unspecified: Secondary | ICD-10-CM | POA: Diagnosis not present

## 2021-09-05 DIAGNOSIS — R739 Hyperglycemia, unspecified: Secondary | ICD-10-CM

## 2021-09-05 DIAGNOSIS — R0789 Other chest pain: Secondary | ICD-10-CM | POA: Diagnosis not present

## 2021-09-05 DIAGNOSIS — E559 Vitamin D deficiency, unspecified: Secondary | ICD-10-CM | POA: Diagnosis not present

## 2021-09-05 LAB — HEPATIC FUNCTION PANEL
ALT: 7 U/L (ref 0–35)
AST: 13 U/L (ref 0–37)
Albumin: 4.5 g/dL (ref 3.5–5.2)
Alkaline Phosphatase: 72 U/L (ref 39–117)
Bilirubin, Direct: 0 mg/dL (ref 0.0–0.3)
Total Bilirubin: 0.6 mg/dL (ref 0.2–1.2)
Total Protein: 6.9 g/dL (ref 6.0–8.3)

## 2021-09-05 LAB — CBC WITH DIFFERENTIAL/PLATELET
Basophils Absolute: 0 10*3/uL (ref 0.0–0.1)
Basophils Relative: 0.2 % (ref 0.0–3.0)
Eosinophils Absolute: 0 10*3/uL (ref 0.0–0.7)
Eosinophils Relative: 0 % (ref 0.0–5.0)
HCT: 41.2 % (ref 36.0–46.0)
Hemoglobin: 14.1 g/dL (ref 12.0–15.0)
Lymphocytes Relative: 44.9 % (ref 12.0–46.0)
Lymphs Abs: 3.9 10*3/uL (ref 0.7–4.0)
MCHC: 34.3 g/dL (ref 30.0–36.0)
MCV: 91.7 fl (ref 78.0–100.0)
Monocytes Absolute: 0.5 10*3/uL (ref 0.1–1.0)
Monocytes Relative: 5.8 % (ref 3.0–12.0)
Neutro Abs: 4.3 10*3/uL (ref 1.4–7.7)
Neutrophils Relative %: 49.1 % (ref 43.0–77.0)
Platelets: 243 10*3/uL (ref 150.0–400.0)
RBC: 4.49 Mil/uL (ref 3.87–5.11)
RDW: 14.4 % (ref 11.5–15.5)
WBC: 8.7 10*3/uL (ref 4.0–10.5)

## 2021-09-05 LAB — BASIC METABOLIC PANEL
BUN: 10 mg/dL (ref 6–23)
CO2: 26 mEq/L (ref 19–32)
Calcium: 9.4 mg/dL (ref 8.4–10.5)
Chloride: 105 mEq/L (ref 96–112)
Creatinine, Ser: 1.07 mg/dL (ref 0.40–1.20)
GFR: 54.68 mL/min — ABNORMAL LOW (ref >60.00)
Glucose, Bld: 84 mg/dL (ref 70–99)
Potassium: 3.8 mEq/L (ref 3.5–5.1)
Sodium: 137 mEq/L (ref 135–145)

## 2021-09-05 MED ORDER — KETOROLAC TROMETHAMINE 30 MG/ML IJ SOLN
30.0000 mg | Freq: Once | INTRAMUSCULAR | Status: AC
Start: 2021-09-05 — End: 2021-09-05
  Administered 2021-09-05: 15:00:00 30 mg via INTRAMUSCULAR

## 2021-09-05 MED ORDER — TRAMADOL HCL 50 MG PO TABS: 50.0000 mg | ORAL_TABLET | Freq: Four times a day (QID) | ORAL | 0 refills | Status: DC | PRN

## 2021-09-05 NOTE — Assessment & Plan Note (Signed)
Last vitamin D Lab Results  Component Value Date   VD25OH 16.81 (L) 05/23/2021   Low, reminded to start oral replacement

## 2021-09-05 NOTE — Progress Notes (Signed)
Patient ID: Lori [name], female   DOB: [birth year], 65 y.o.   MRN: [id number]        Chief Complaint: follow up right chest pain       HPI:  Lori [name] is a 65 y.o. female here with c/o 3 wks ongoing mod to severe CP after hearing a "pop" at the start with a twisting motion, constant, not improving despite seeing UC dec 9 with neg rib xray, still worse to deep breaths, any overall movements and palpation of the right lower lateral chest wall below the right breast.  Pt denies other chest pain, increased sob or doe, wheezing, orthopnea, PND, increased LE swelling, palpitations, dizziness or syncope.  Not taking Vit d   Pt denies polydipsia, polyuria, or new focal neuro s/s.  Dec 9 rib xray neg for fracture and pt dx with costochondritis       Wt Readings from Last 3 Encounters:  09/05/21 122 lb 3.2 oz (55.4 kg)  07/13/21 125 lb (56.7 kg)  05/23/21 127 lb (57.6 kg)   BP Readings from Last 3 Encounters:  09/05/21 130/70  08/24/21 134/75  05/23/21 118/60         Past Medical History:  Diagnosis Date   Allergic rhinitis    Anemia    Ankle fracture 05/2016   Anxiety    Arthritis    Asthma    Barrett esophagus 2007   Chest pain    Recurrent episodes.  Has had 3 negative nuclear stress test, and a completely normal coronary CT angiogram   COPD (chronic obstructive pulmonary disease) with chronic bronchitis (HCC)    & Emphysema   Depression    Duodenitis without mention of hemorrhage 2007   Esophageal reflux 2007   Esophageal stricture    Full dentures    H/O hiatal hernia    Hiatal hernia 2006,2007   Hyperlipidemia    Multiple fractures    from falls, fx rt. elbow, fx left wrist, bilateral ankles   Pneumonia 10/2016   Restrictive lung disease 06/30/2017   Ulcer    Past Surgical History:  Procedure Laterality Date   ABDOMINAL HYSTERECTOMY     BSO   APPENDECTOMY     CESAREAN SECTION     x 2   CHOLECYSTECTOMY     CHONDROPLASTY Left 01/06/2015   Procedure:  CHONDROPLASTY;  Surgeon: Marybelle Killings, MD;  Location: [facility];  Service: Orthopedics;  Laterality: Left;   COLONOSCOPY     CT CTA CORONARY W/CA SCORE W/CM &/OR WO/CM  12/02/2016   CORONARY CALCIUM SCORE 0.  NO EVIDENCE OF CAD.  Normal-sized PA.  Normal pulmonary venous drainage the left atrium.  No ASD or VSD.   ELBOW FRACTURE SURGERY     right   KNEE ARTHROSCOPY WITH EXCISION PLICA Left 36/62/9476   Procedure: KNEE ARTHROSCOPY WITH EXCISION PLICA;  Surgeon: Marybelle Killings, MD;  Location: [facility];  Service: Orthopedics;  Laterality: Left;   Lower Extremity Arterial Dopplers  07/06/2013   RABI 1.0, LABI 1.1.; No evidence of significant vascular atherogenic plaque   NECK SURGERY     NM MYOVIEW LTD  09/2014   a) LOW RISK. Normal EF of 65%, no RWMA. NO ISCHEMIA OR INFARCTION.;; b) 05/02/2009: Normal study.  LOW RISK.  No ischemia or infarction.  EF 74%.  Breast attenuation noted.   TONSILLECTOMY     TRANSTHORACIC ECHOCARDIOGRAM  03/28/2021   a) Normal EF 60 to 65%.  GR  1 DD.  No R WMA.  Normal RV size and function.  Normal RAP.  Normal valves. => NORMAL;; b) 04/23/16/2010: Normal study.  LOW RISK.  No ischemia or infarction.  EF 74%.  Breast attenuation noted.17/2010: ([facility]) EF>55%.  Normal wall motion.  Normal valves.   WRIST FRACTURE SURGERY     left, has plate    reports that she has been smoking cigarettes. She started smoking about 44 years ago. She has a 19.50 pack-year smoking history. She has never used smokeless tobacco. She reports that she does not drink alcohol and does not use drugs. family history includes Dementia in her mother; Diabetes in her maternal aunt, maternal grandmother, and another family member; Emphysema in her father; Heart disease in her brother. Allergies  Allergen Reactions   Bee Venom Swelling   Codeine Nausea Only and Other (See Comments)    CAUSES ULCERS   Ibuprofen Nausea And Vomiting   Clonidine Derivatives Other (See  Comments)    Unknown    Statins    Tramadol Nausea Only   Latex Rash   Levofloxacin Nausea Only   Propoxyphene N-Acetaminophen Nausea Only   Current Outpatient Medications on File Prior to Visit  Medication Sig Dispense Refill   AIMOVIG 140 MG/ML SOAJ Inject 140 mg into the skin every 30 (thirty) days.     albuterol (PROVENTIL) (2.5 MG/3ML) 0.083% nebulizer solution Take 2.5 mg by nebulization every 6 (six) hours as needed for wheezing or shortness of breath.     ALPRAZolam (XANAX) 1 MG tablet TAKE 1 TABLET BY MOUTH AT BEDTIME AS NEEDED FOR ANXIETY 90 tablet 1   benzonatate (TESSALON PERLES) 100 MG capsule Take 2 capsules (200 mg total) by mouth 3 (three) times daily as needed for cough. 60 capsule 3   cetirizine (ZYRTEC) 10 MG tablet Take 1 tablet (10 mg total) by mouth daily. 31 tablet 5   cyanocobalamin 1000 MCG tablet Take 1 tablet (1,000 mcg total) by mouth daily. 90 tablet 3   cyclobenzaprine (FLEXERIL) 5 MG tablet Take 1 tablet (5 mg total) by mouth 3 (three) times daily as needed. for muscle spams 30 tablet 0   DEXILANT 60 MG capsule Take 1 capsule by mouth once daily 90 capsule 0   EPINEPHrine 0.3 mg/0.3 mL IJ SOAJ injection INJECT AS DIRECTED FOR SEVERE ALLERGIC REACTION 2 each 1   FASENRA 30 MG/ML SOSY INJECT 30MG SUBCUTANEOUSLY  EVERY 8 WEEKS 1 mL 6   fluticasone-salmeterol (ADVAIR HFA) 230-21 MCG/ACT inhaler Inhale 2 puffs into the lungs 2 (two) times daily. 12 g 5   folic acid (FOLVITE) 1 MG tablet Take 1 tablet (1 mg total) by mouth daily.     gabapentin (NEURONTIN) 300 MG capsule Take 300 mg by mouth 2 (two) times daily as needed.     guaiFENesin (TUSSIN CHEST CONGESTION PO) Take by mouth. Take as needed     levalbuterol (XOPENEX HFA) 45 MCG/ACT inhaler Inhale 2 puffs into the lungs as directed. 15 g 2   meclizine (ANTIVERT) 25 MG tablet Take 1 tablet (25 mg total) by mouth 3 (three) times daily as needed for dizziness. 40 tablet 2   meloxicam (MOBIC) 7.5 MG tablet Take 1  tablet (7.5 mg total) by mouth daily. 14 tablet 0   metoCLOPramide (REGLAN) 10 MG tablet Take by mouth.     mometasone-formoterol (DULERA) 200-5 MCG/ACT AERO Inhale 2 puffs into the lungs 2 (two) times daily. 1 each 5   montelukast (SINGULAIR) 10 MG tablet Take  1 tablet (10 mg total) by mouth at bedtime. 90 tablet 3   NARCAN 4 MG/0.1ML LIQD nasal spray kit      nitroGLYCERIN (NITROSTAT) 0.4 MG SL tablet Place 1 tablet (0.4 mg total) under the tongue every 5 (five) minutes as needed for chest pain. 25 tablet 1   rosuvastatin (CRESTOR) 40 MG tablet Take 1 tablet (40 mg total) by mouth daily. 90 tablet 3   triamcinolone (NASACORT) 55 MCG/ACT AERO nasal inhaler Place 2 sprays into the nose daily. 1 each 12   valACYclovir (VALTREX) 1000 MG tablet Take 1 tablet by mouth twice daily 180 tablet 1   Current Facility-Administered Medications on File Prior to Visit  Medication Dose Route Frequency Provider Last Rate Last Admin   Benralizumab SOSY 30 mg  30 mg Subcutaneous Q8 weeks Valentina Shaggy, MD   30 mg at 08/02/21 0952        ROS:  All others reviewed and negative.  Objective        PE:  BP 130/70    Pulse 69    Resp 18    Ht '5\' 4"'  (1.626 m)    Wt 122 lb 3.2 oz (55.4 kg)    SpO2 98%    BMI 20.98 kg/m                 Constitutional: Pt appears in NAD               HENT: Head: NCAT.                Right Ear: External ear normal.                 Left Ear: External ear normal.                Eyes: . Pupils are equal, round, and reactive to light. Conjunctivae and EOM are normal               Nose: without d/c or deformity               Neck: Neck supple. Gross normal ROM               Cardiovascular: Normal rate and regular rhythm.                 Pulmonary/Chest: Effort normal and breath sounds without rales or wheezing.                Abd:  Soft, NT, ND, + BS, no organomegaly; right anterolat chest wall tender in relatively focal area without rash or swelling               Neurological:  Pt is alert. At baseline orientation, motor grossly intact               Skin: Skin is warm. No rashes, no other new lesions, LE edema - none               Psychiatric: Pt behavior is normal without agitation   Micro: none  Cardiac tracings I have personally interpreted today:  none  Pertinent Radiological findings (summarize): none   Lab Results  Component Value Date   WBC 8.7 09/05/2021   HGB 14.1 09/05/2021   HCT 41.2 09/05/2021   PLT 243.0 09/05/2021   GLUCOSE 84 09/05/2021   CHOL 297 (H) 05/23/2021   TRIG (H) 05/23/2021    434.0 Triglyceride is over 400; calculations on Lipids are invalid.  HDL 36.10 (L) 05/23/2021   LDLDIRECT 212.0 05/23/2021   [place] Comment 03/12/2017   ALT 7 09/05/2021   AST 13 09/05/2021   NA 137 09/05/2021   K 3.8 09/05/2021   CL 105 09/05/2021   CREATININE 1.07 09/05/2021   BUN 10 09/05/2021   CO2 26 09/05/2021   TSH 1.028 03/28/2021   INR 0.9 08/31/2008   HGBA1C 5.3 05/23/2021   Assessment/Plan:  KADASIA KASSING is a 65 y.o. White or Caucasian [1] female with  has a past medical history of Allergic rhinitis, Anemia, Ankle fracture (05/2016), Anxiety, Arthritis, Asthma, Barrett esophagus (2007), Chest pain, COPD (chronic obstructive pulmonary disease) with chronic bronchitis ([place]), Depression, Duodenitis without mention of hemorrhage (2007), Esophageal reflux (2007), Esophageal stricture, Full dentures, H/O hiatal hernia, Hiatal hernia ([id number]), Hyperlipidemia, Multiple fractures, Pneumonia (10/2016), Restrictive lung disease (06/30/2017), and Ulcer.  Right-sided chest pain 3 wks remains mod to severe, I suspect intercostal muscle tearing, not costochondritis; for cxr to r/o pulm issue and LFTs but unlikely to be abnormal; today for tramadol prn after toradol 30 mg IM  X 1; consider RUQ u/s to further evaluate  Vitamin D deficiency Last vitamin D Lab Results  Component Value Date   VD25OH 16.81 (L) 05/23/2021   Low, reminded to start  oral replacement   Hyperglycemia Lab Results  Component Value Date   HGBA1C 5.3 05/23/2021   Stable, pt to continue current medical treatment  - diet  Followup: Return in about 3 months (around 12/04/2021).  Lori Cower, MD 09/05/2021 9:07 PM [facility]

## 2021-09-05 NOTE — Assessment & Plan Note (Addendum)
3 wks remains mod to severe, I suspect intercostal muscle tearing, not costochondritis; for cxr to r/o pulm issue and LFTs but unlikely to be abnormal; today for tramadol prn after toradol 30 mg IM  X 1; consider RUQ u/s to further evaluate

## 2021-09-05 NOTE — Assessment & Plan Note (Signed)
Lab Results  Component Value Date   HGBA1C 5.3 05/23/2021   Stable, pt to continue current medical treatment  - diet

## 2021-09-05 NOTE — Patient Instructions (Signed)
You had the Toradol shot 30 mg for pain  Please take all new medication as prescribed  - the tramadol for pain (and call for refills if needed)  Please continue all other medications as before, and refills have been done if requested.  Please have the pharmacy call with any other refills you may need.  Please keep your appointments with your specialists as you may have planned  Please go to the XRAY Department in the first floor for the x-ray testing  Please go to the LAB at the blood drawing area for the tests to be done  You will be contacted by phone if any changes need to be made immediately.  Otherwise, you will receive a letter about your results with an explanation, but please check with MyChart first.  Please remember to sign up for MyChart if you have not done so, as this will be important to you in the future with finding out test results, communicating by private email, and scheduling acute appointments online when needed.

## 2021-09-06 ENCOUNTER — Telehealth: Payer: Self-pay | Admitting: Internal Medicine

## 2021-09-06 ENCOUNTER — Encounter: Payer: Self-pay | Admitting: Internal Medicine

## 2021-09-06 NOTE — Telephone Encounter (Signed)
Patient calling for lab and xray results  Informed patient of 09-05-2021 lab result notes from provider   Could not locate results from xray, patient is requesting a call back for xray results

## 2021-09-06 NOTE — Telephone Encounter (Signed)
Pt called in regarding Xray results. Advised pt no new findings continue same treatment and copy was sent to her.  Pt understood.

## 2021-09-06 NOTE — Telephone Encounter (Signed)
Reviewed labs with patient and advised that provider has not yet reviewed x-ray results. Will contact once resulted

## 2021-09-08 ENCOUNTER — Other Ambulatory Visit: Payer: Self-pay | Admitting: Internal Medicine

## 2021-09-10 MED ORDER — MECLIZINE HCL 25 MG PO TABS: 25.0000 mg | ORAL_TABLET | Freq: Three times a day (TID) | ORAL | 2 refills | Status: DC | PRN

## 2021-09-11 ENCOUNTER — Telehealth: Payer: Self-pay

## 2021-09-11 MED ORDER — TRAMADOL HCL 50 MG PO TABS: 50.0000 mg | ORAL_TABLET | Freq: Four times a day (QID) | ORAL | 0 refills | Status: DC | PRN

## 2021-09-11 NOTE — Telephone Encounter (Signed)
Prescription was sent to pharmacy 09/05/21. Patient is out of medication and states that she sometimes take 2 at a time instead of one. Advised patient on rx directions. Patient states that she is still having pain and requesting a refill.

## 2021-09-11 NOTE — Telephone Encounter (Signed)
Patient notified via voicemail.

## 2021-09-11 NOTE — Telephone Encounter (Signed)
Pt called in still having a lot of discomfort with pain under Rt breast and rib cage. Heard a pop and pain became worse. Pt requesting another Rx of Tramadol 50mg . Last refill 09/05/21 30 pills.  Please update patient when this refill is complete.

## 2021-09-11 NOTE — Telephone Encounter (Signed)
Page for refill but to only take as prescribed

## 2021-09-12 IMAGING — DX DG LUMBAR SPINE COMPLETE 4+V
5 series · 5 of 5 positions shown · non-contrast
Comparison: 04/21/2019

CLINICAL DATA: RIGHT lower back and buttock pain post fall

EXAM:
LUMBAR SPINE - COMPLETE 4+ VIEW

[l-spine ap]
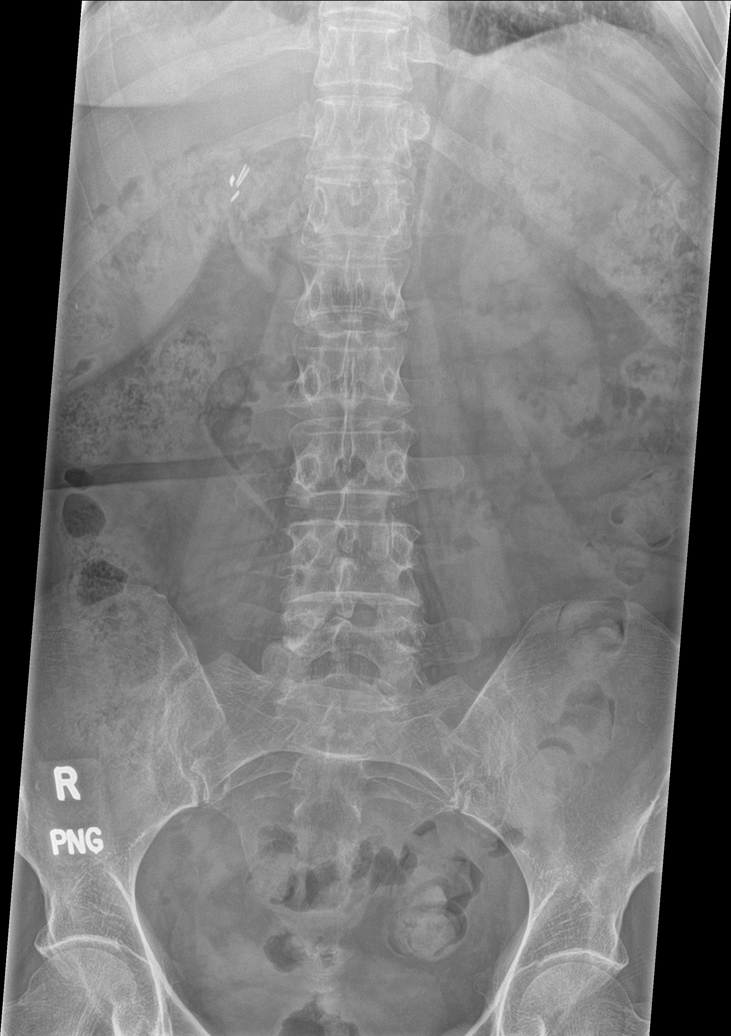

[l-spine obl (1 of 2)]
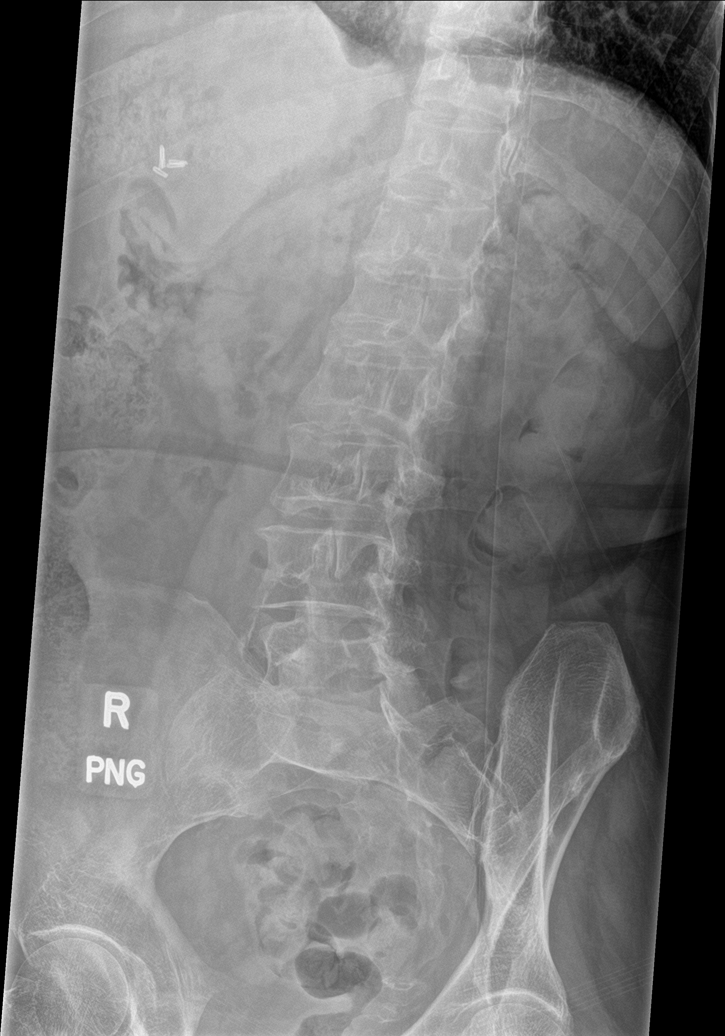

[l-spine obl (2 of 2)]
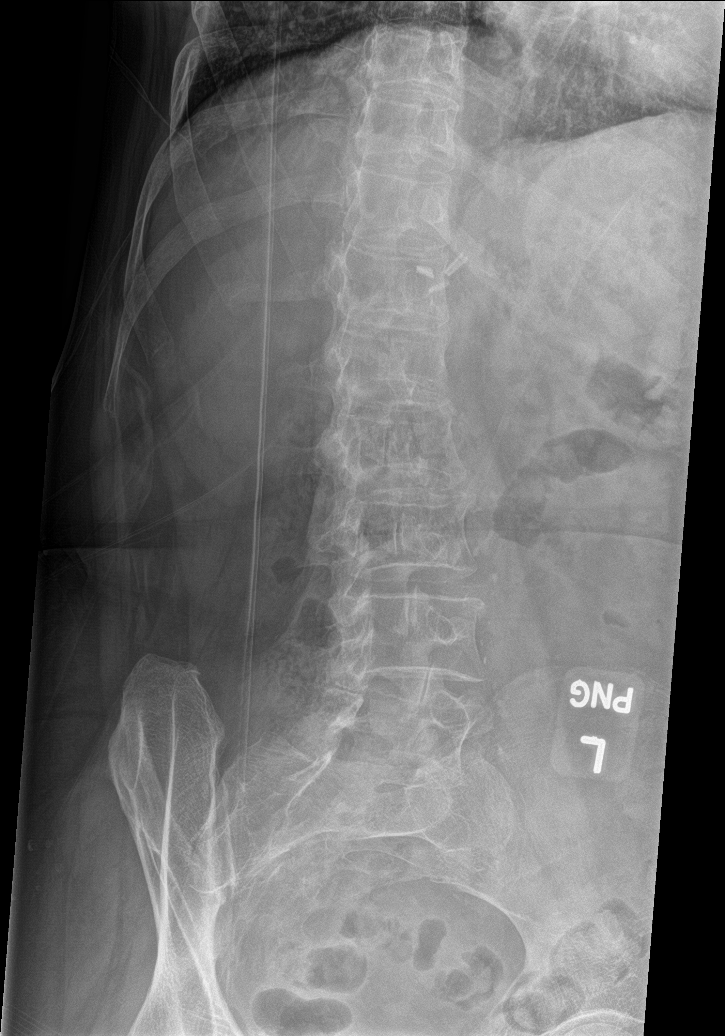

[l-spine lat]
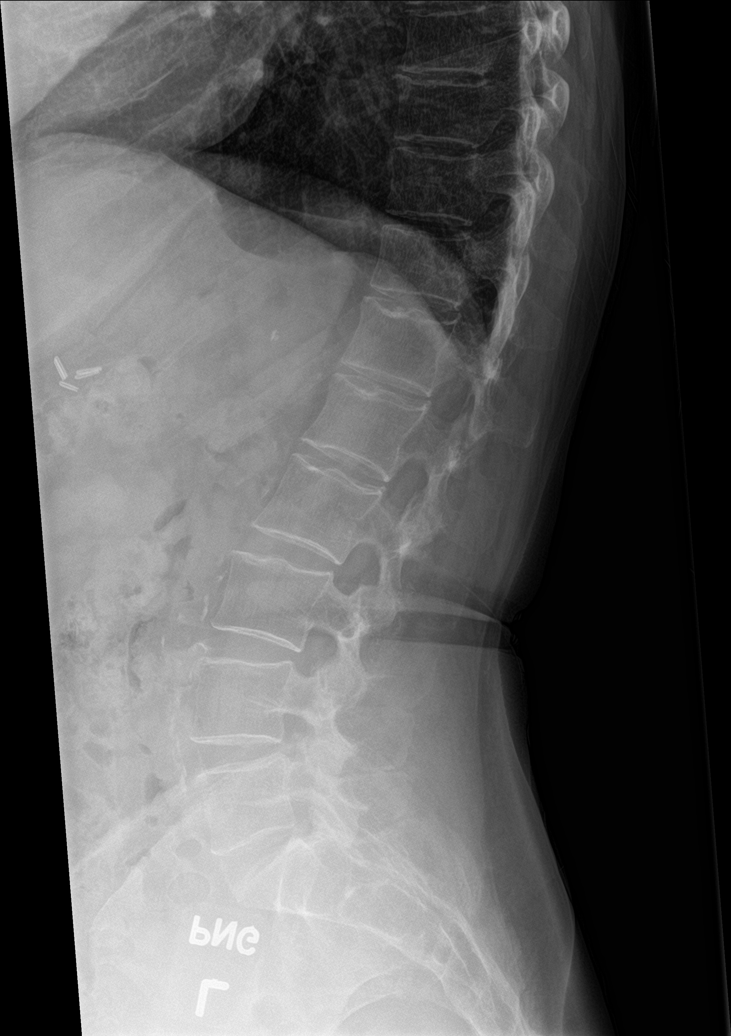

[l-spine spot]
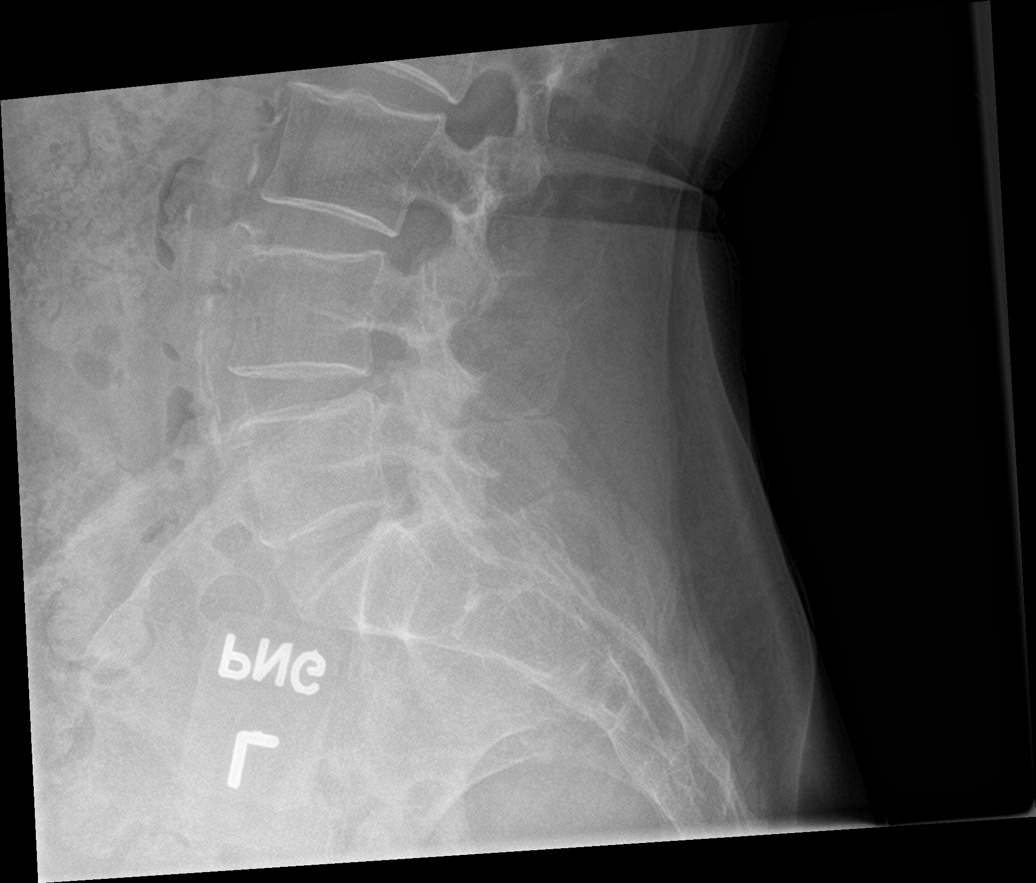

[5 of 5 positions shown; findings below may reference images not displayed]

FINDINGS: Osseous demineralization.

Hypoplastic last ribs.

Five non-rib-bearing lumbar vertebra.

Minimal scattered superior endplate irregularity unchanged.

Vertebral body and disc space heights maintained.

No fracture, subluxation, or bone destruction.

No spondylolysis.

SI joints preserved.

Atherosclerotic calcifications aorta and iliac arteries.

Surgical clips RIGHT upper quadrant.
IMPRESSION: Osseous demineralization.

No acute abnormalities.

## 2021-09-12 IMAGING — DX DG HIP (WITH OR WITHOUT PELVIS) 3-4V BILAT
5 series · 5 of 5 positions shown · non-contrast
Comparison: 02/14/2016

CLINICAL DATA: RIGHT lower back and buttock pain post fall

EXAM:
DG HIP (WITH OR WITHOUT PELVIS) 3-4V BILAT

[pelvis ap]
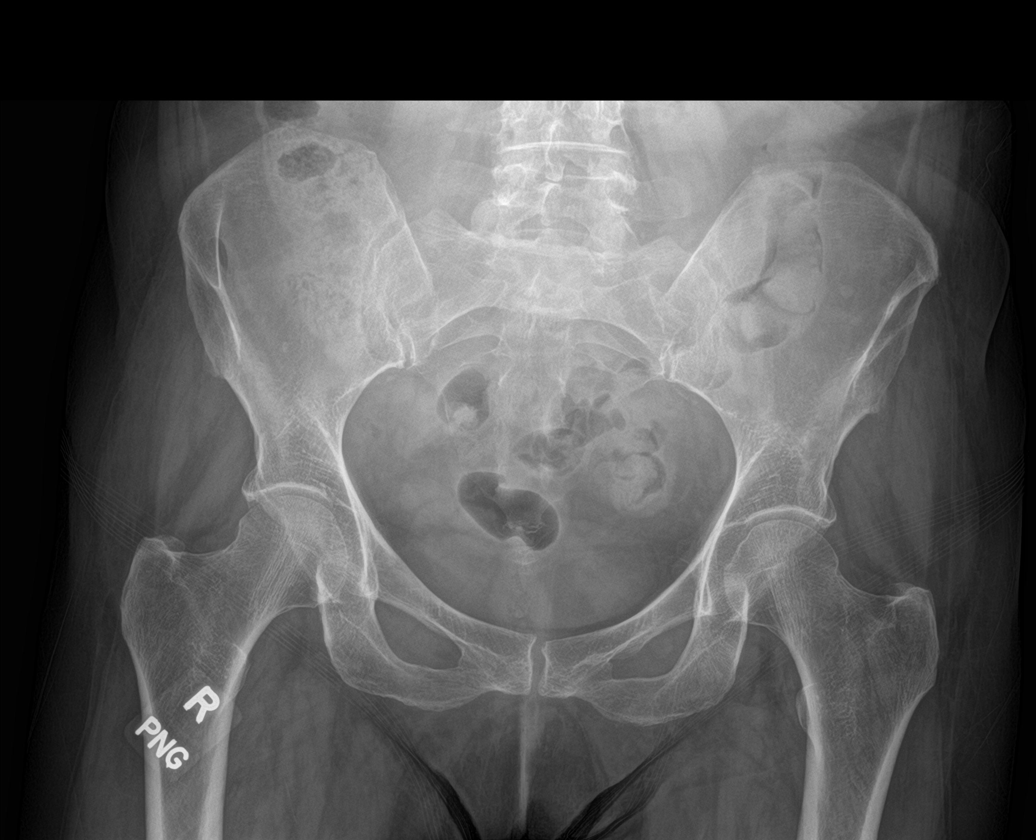

[hip ap (1 of 2)]
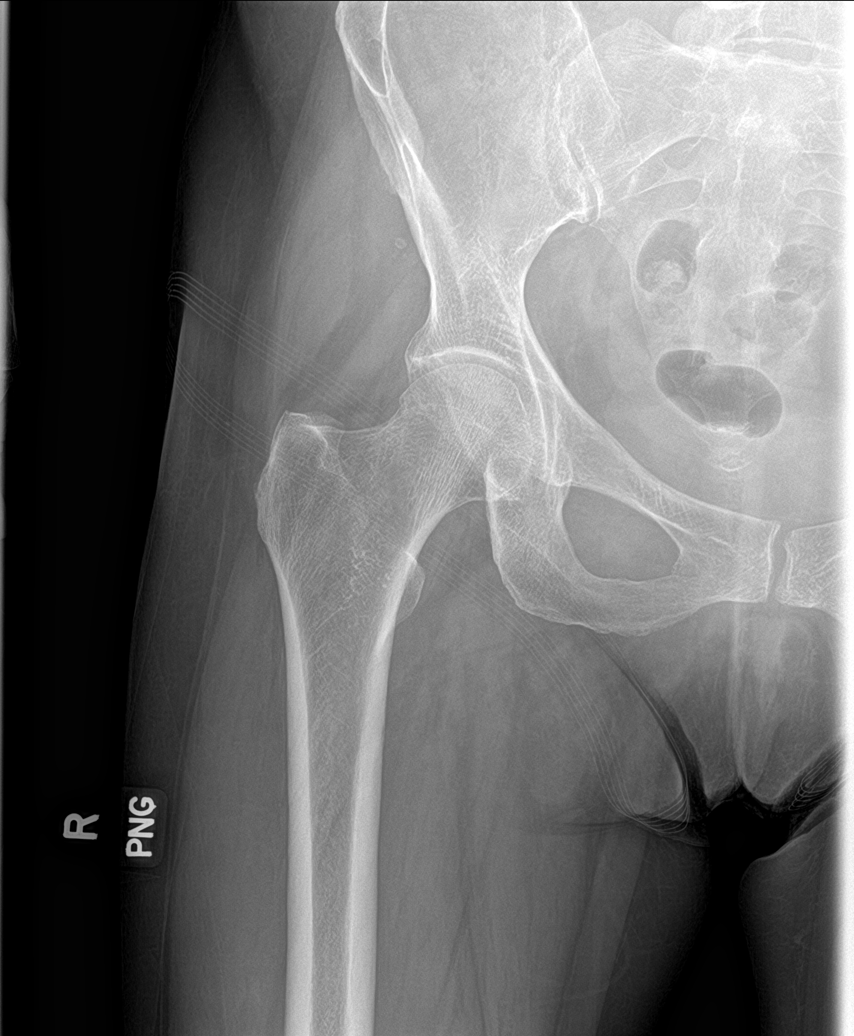

[hip lat (1 of 2)]
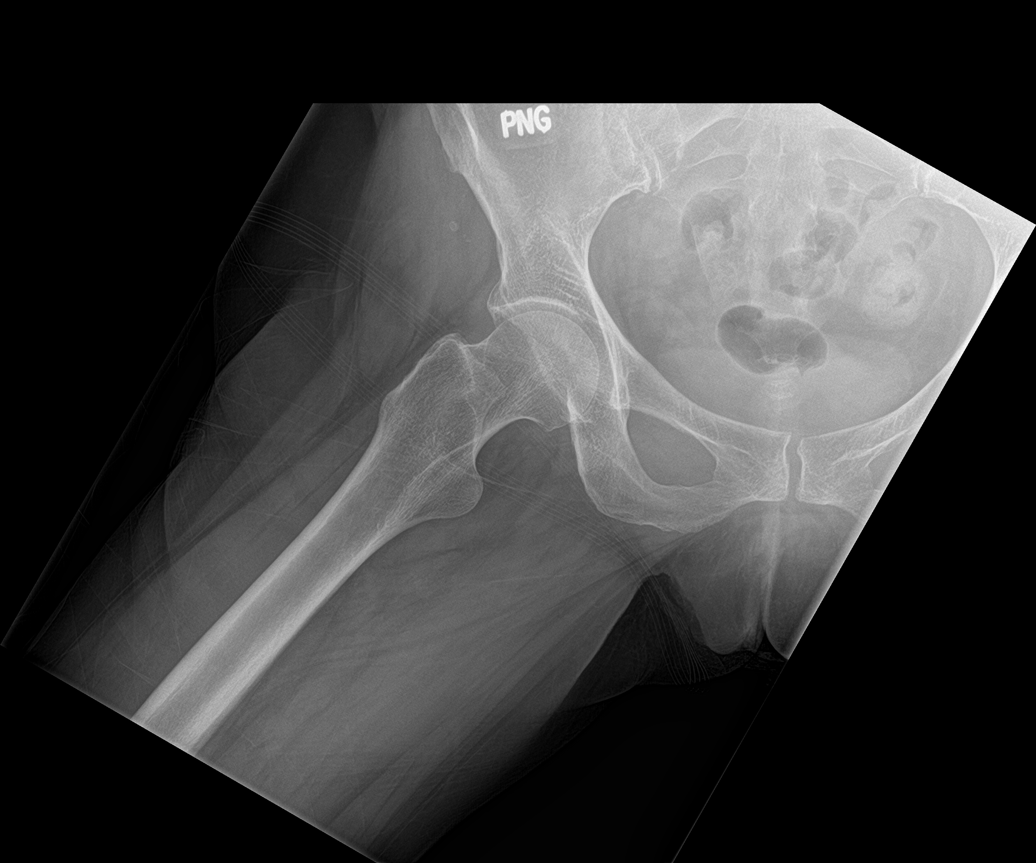

[hip ap (2 of 2)]
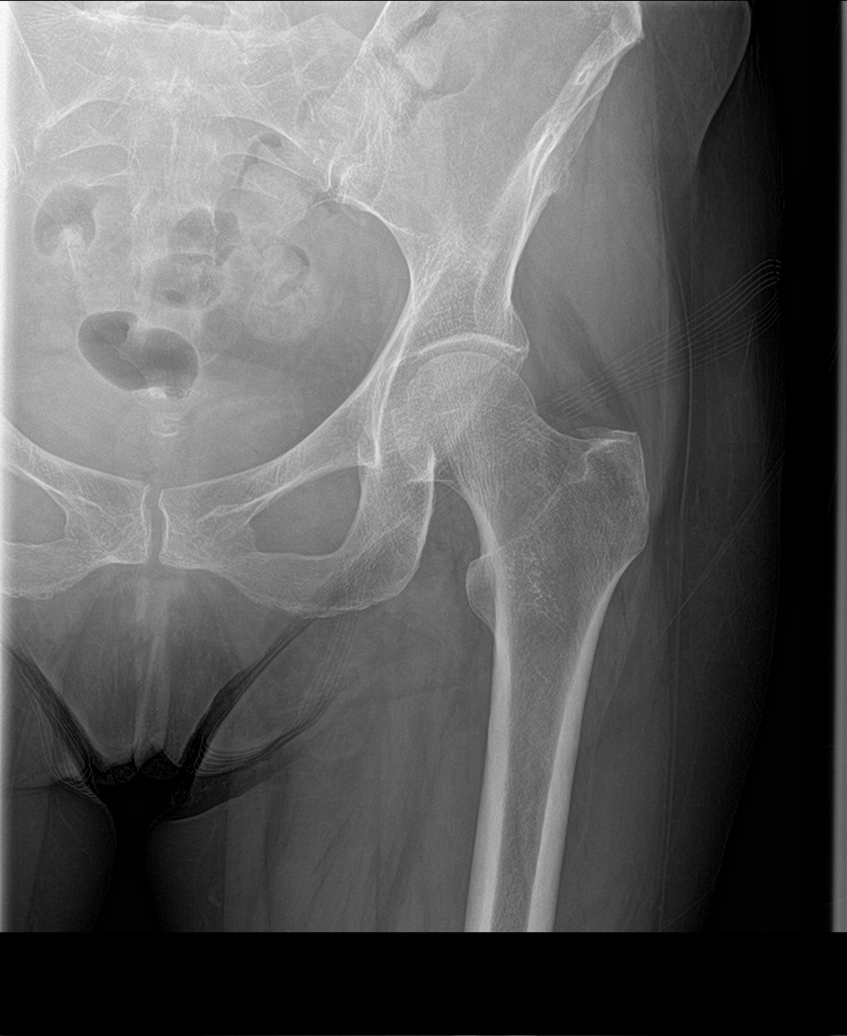

[hip lat (2 of 2)]
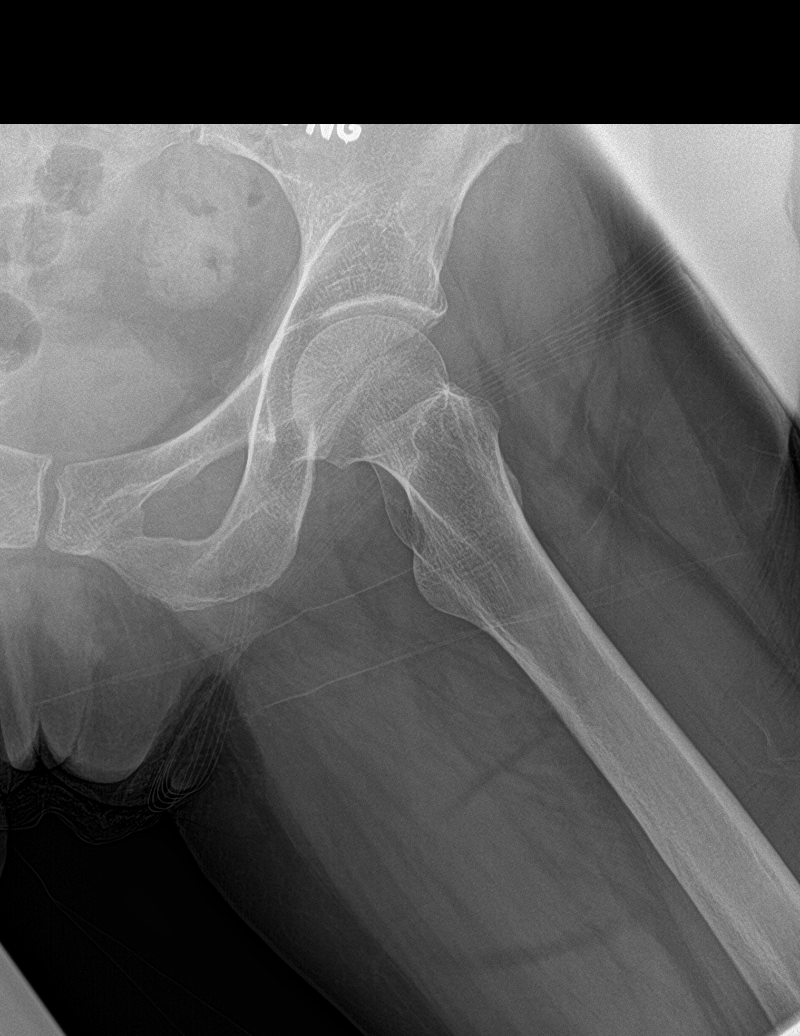

[5 of 5 positions shown; findings below may reference images not displayed]

FINDINGS: Osseous demineralization.

Hip and SI joint spaces preserved.

No fracture, dislocation, or bone destruction.
IMPRESSION: No acute abnormalities.

## 2021-09-13 IMAGING — CR DG SHOULDER 2+V*R*
3 series · 3 of 3 positions shown · non-contrast
Comparison: Right humerus x-rays dated June 08, 2017.

CLINICAL DATA: Right shoulder pain after fall.

EXAM:
RIGHT SHOULDER - 2+ VIEW

[w shoulder external right]
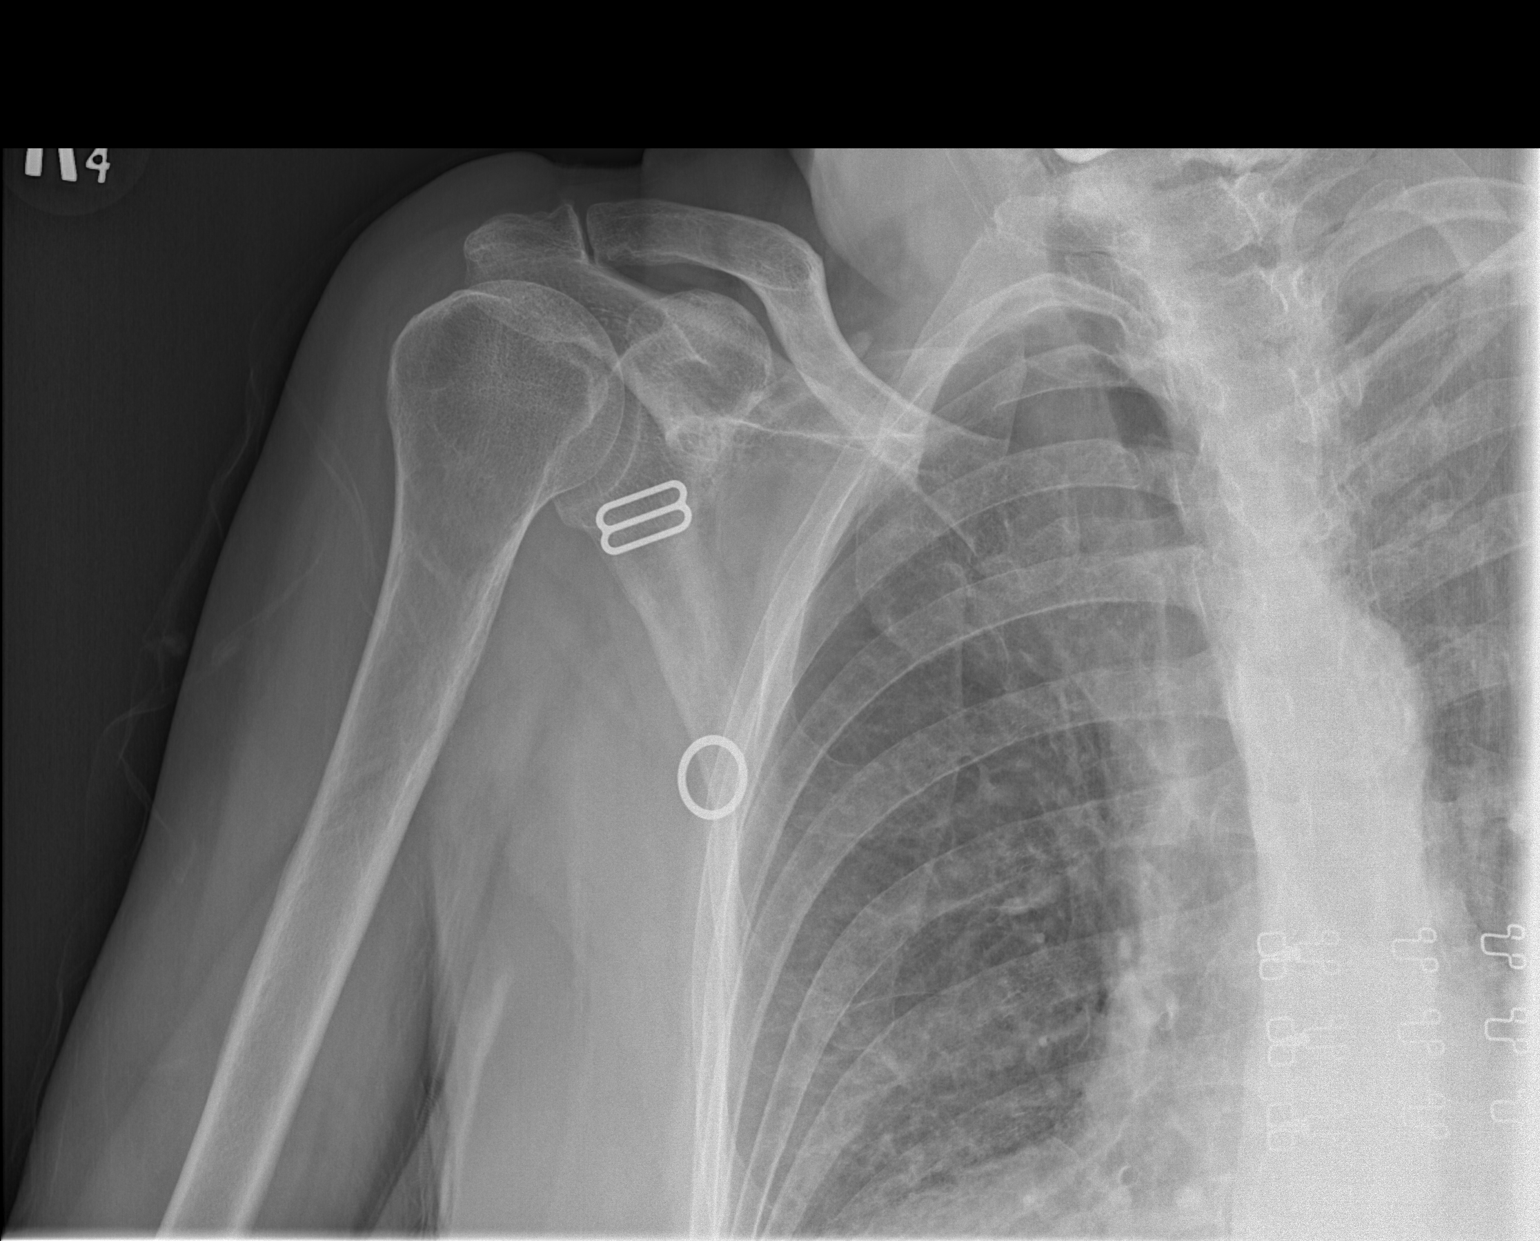

[w shoulder y-view right (1 of 2)]
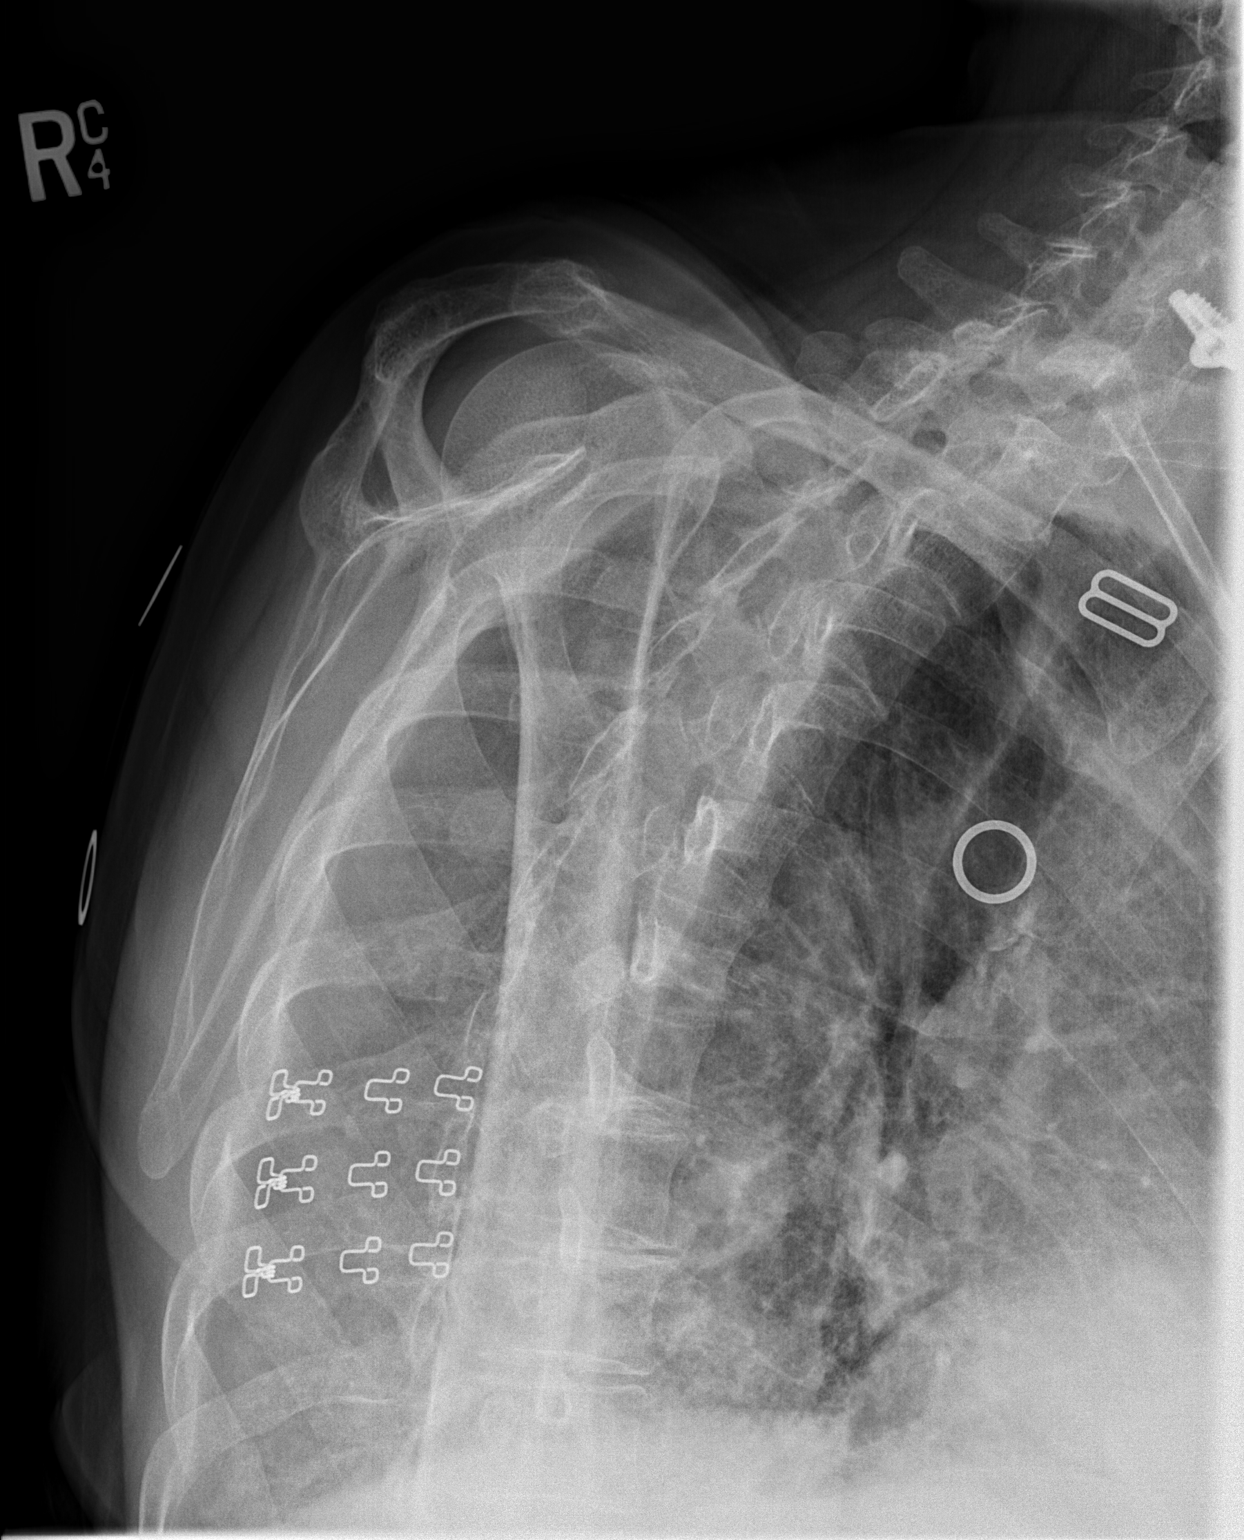

[w shoulder y-view right (2 of 2)]
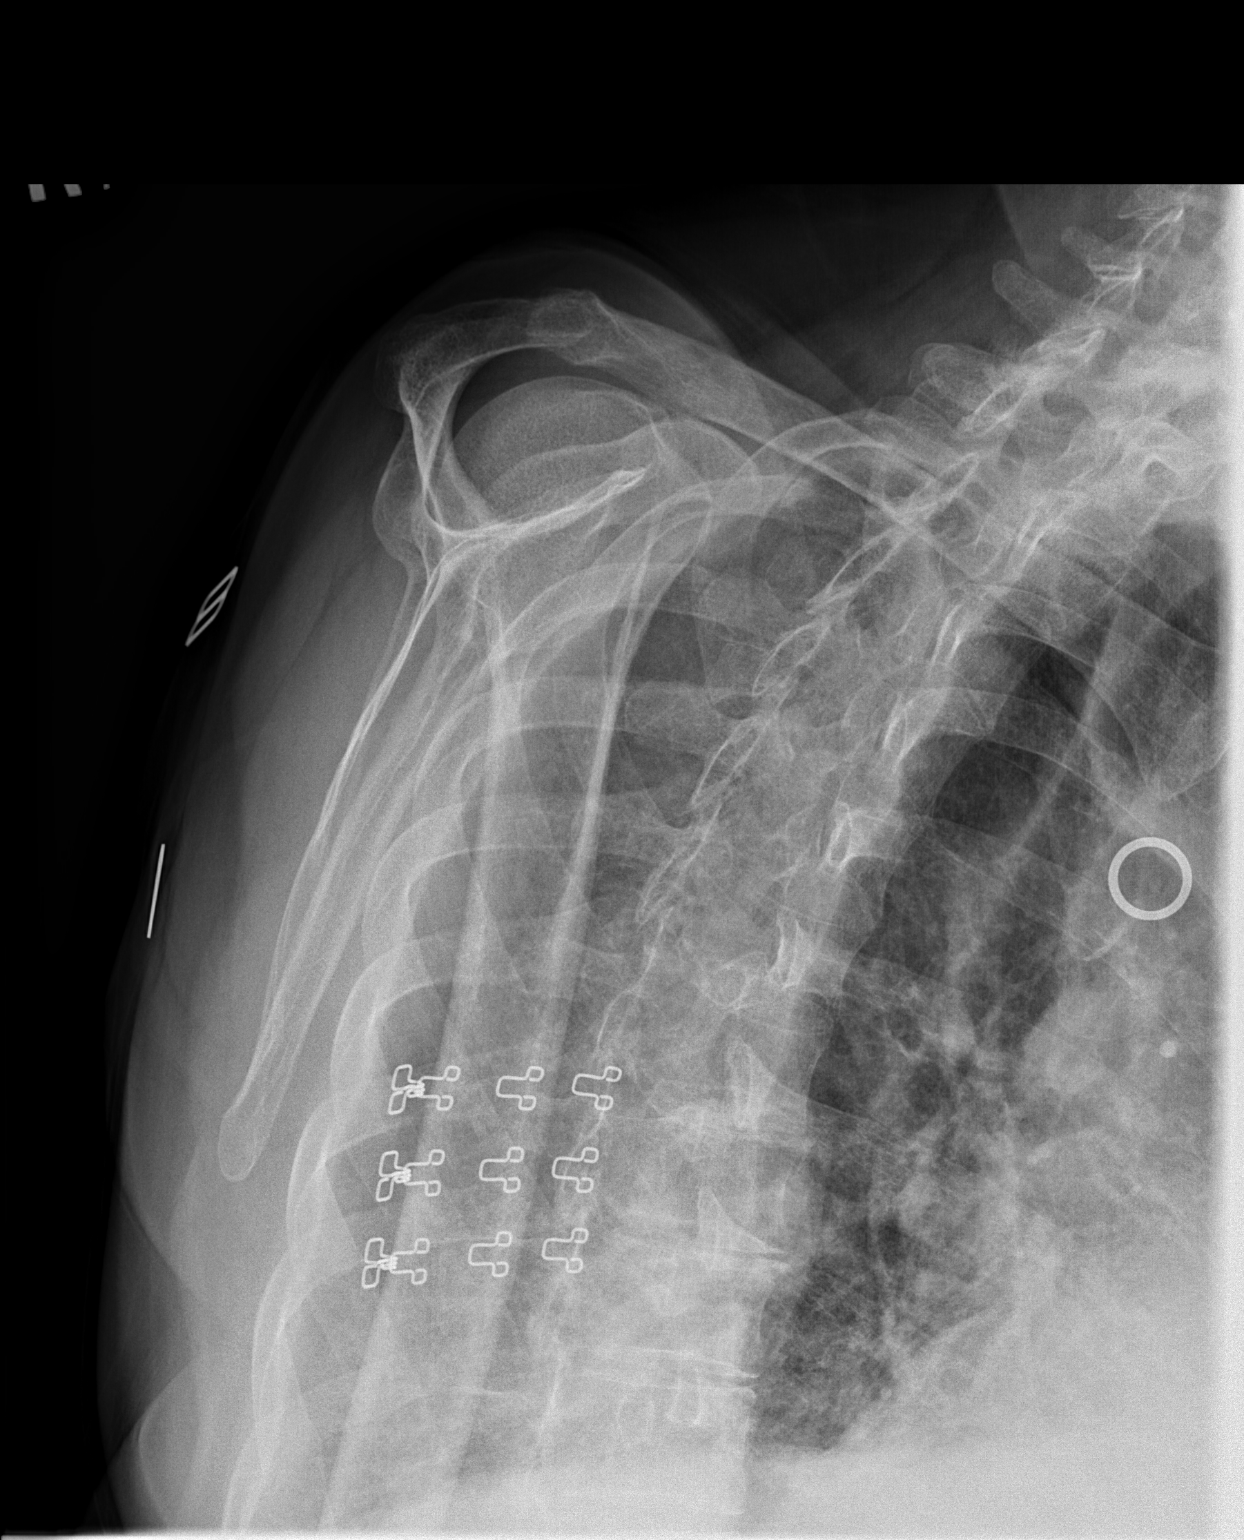

[3 of 3 positions shown; findings below may reference images not displayed]

FINDINGS: No acute fracture or dislocation. Mild degenerative changes of the
acromioclavicular joint. Soft tissues are unremarkable.
IMPRESSION: 1.  No acute osseous abnormality.

## 2021-09-13 IMAGING — CR DG ELBOW COMPLETE 3+V*R*
4 series · 4 of 4 positions shown · non-contrast
Comparison: 06/08/2017

CLINICAL DATA: Fall on this morning onto right side with right
elbow pain.

EXAM:
RIGHT ELBOW - COMPLETE 3+ VIEW

[x elbow ap right]
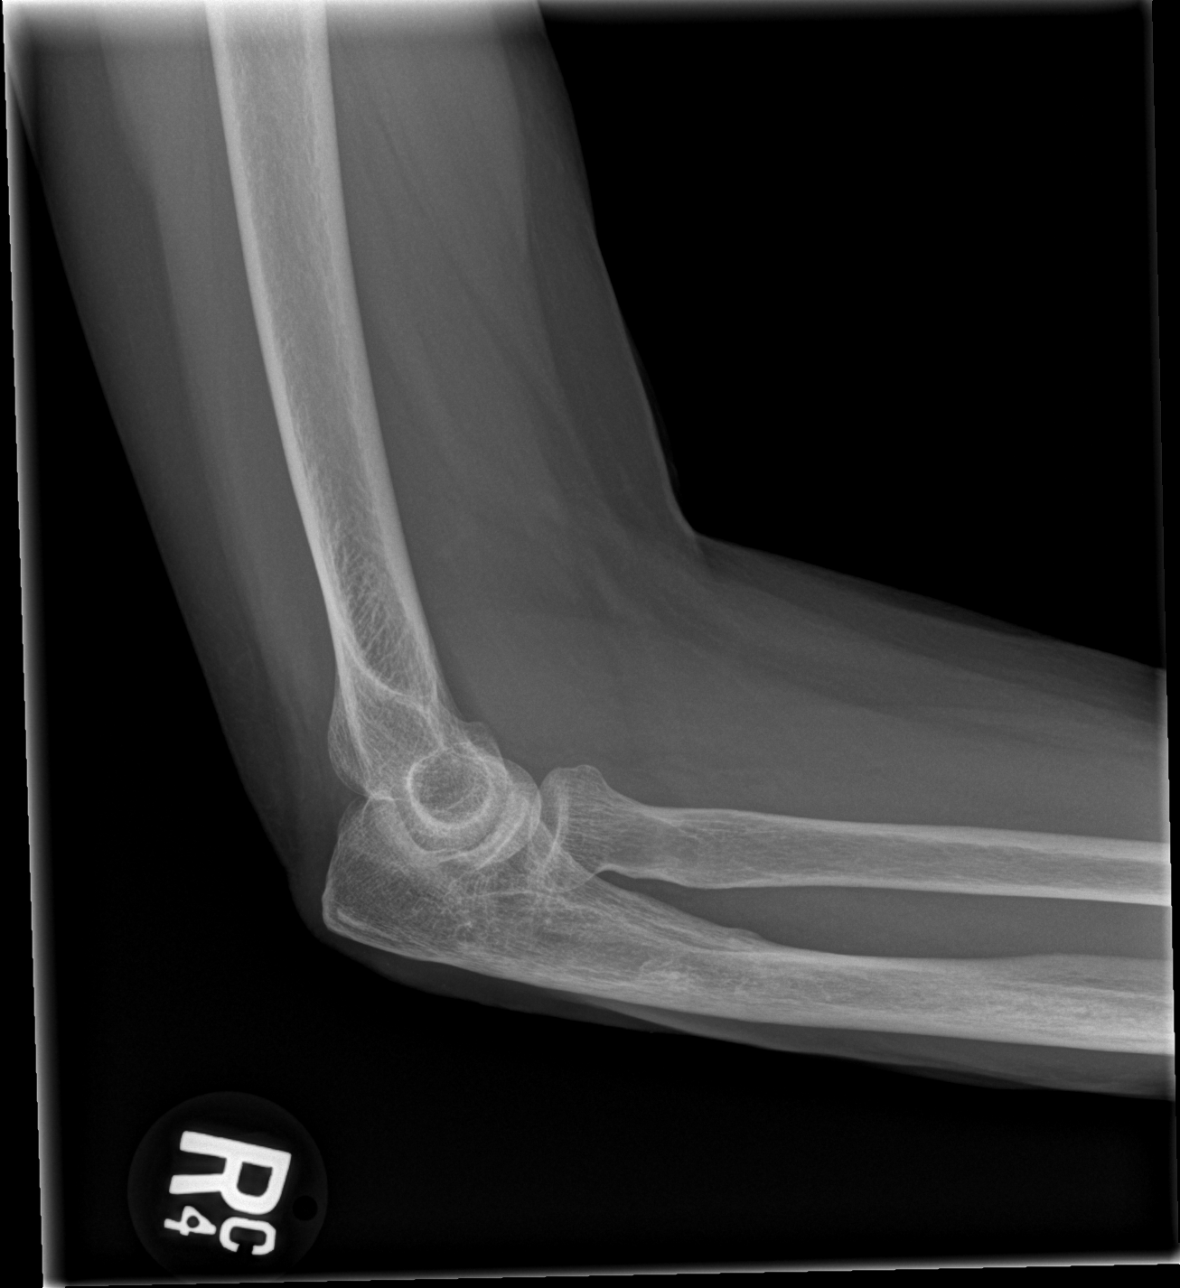

[x elbow obl right (1 of 2)]
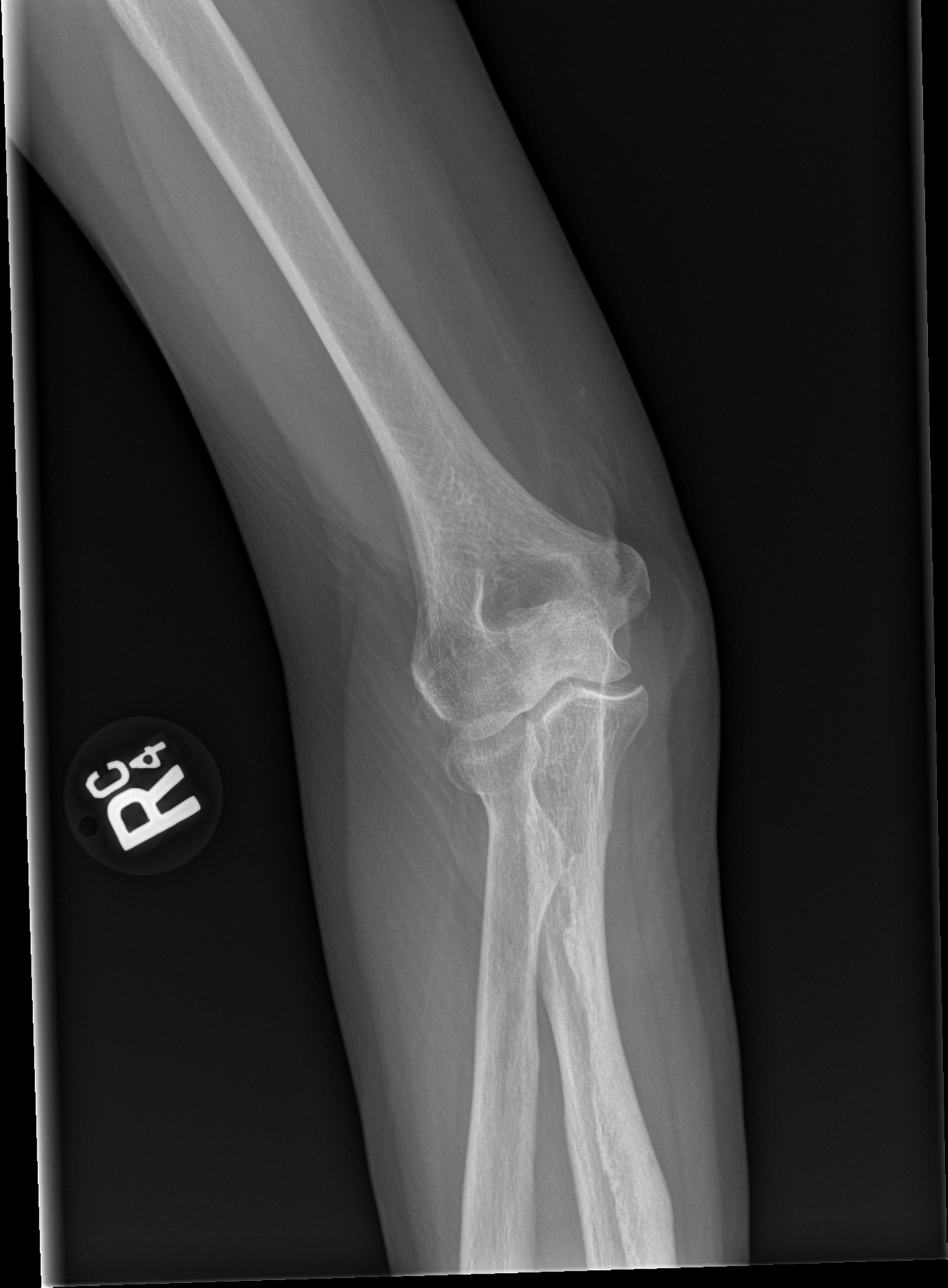

[x elbow obl right (2 of 2)]
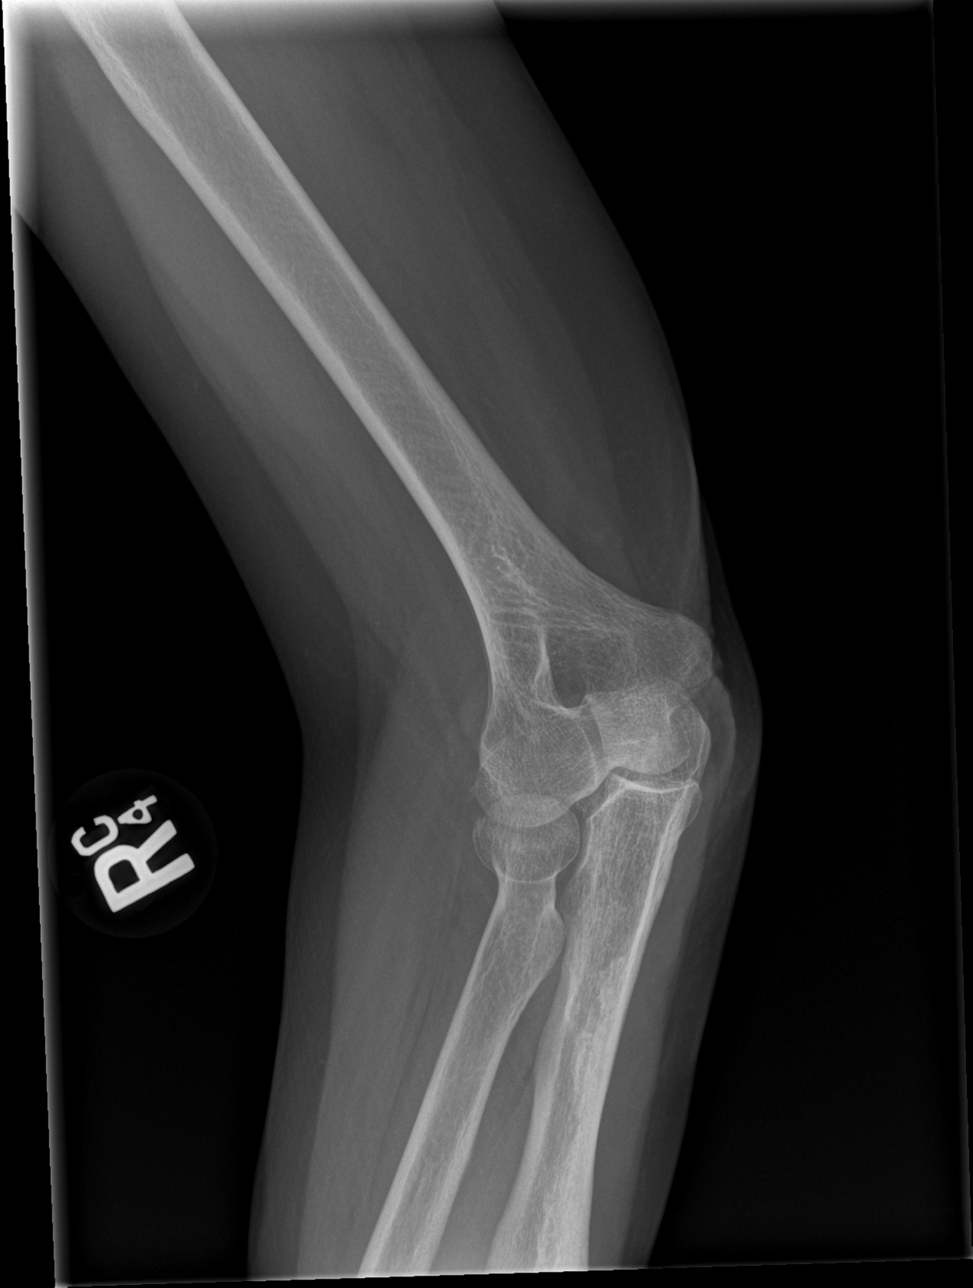

[x elbow lat right]
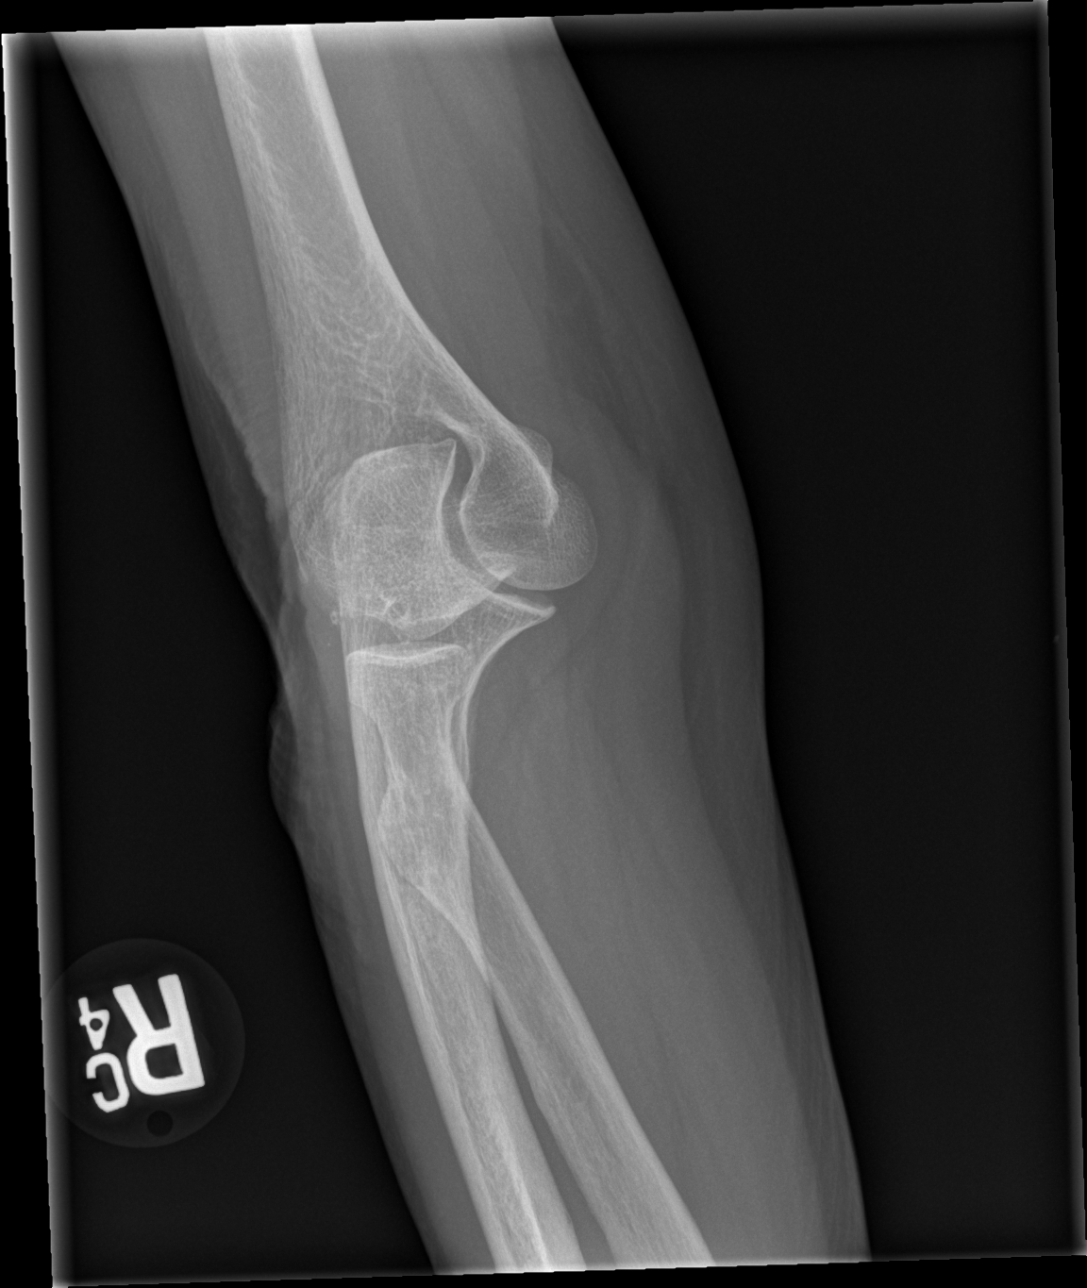

[4 of 4 positions shown; findings below may reference images not displayed]

FINDINGS: No evidence of acute fracture or dislocation. No displaced fat pads.
Subtle spurring along the lateral aspect of the radial head
unchanged.
IMPRESSION: No acute findings.

## 2021-09-13 IMAGING — CR DG KNEE COMPLETE 4+V*R*
4 series · 4 of 4 positions shown · non-contrast
Comparison: None.

CLINICAL DATA: Fall on this morning with right knee pain.

EXAM:
RIGHT KNEE - COMPLETE 4+ VIEW

[t knee ap right]
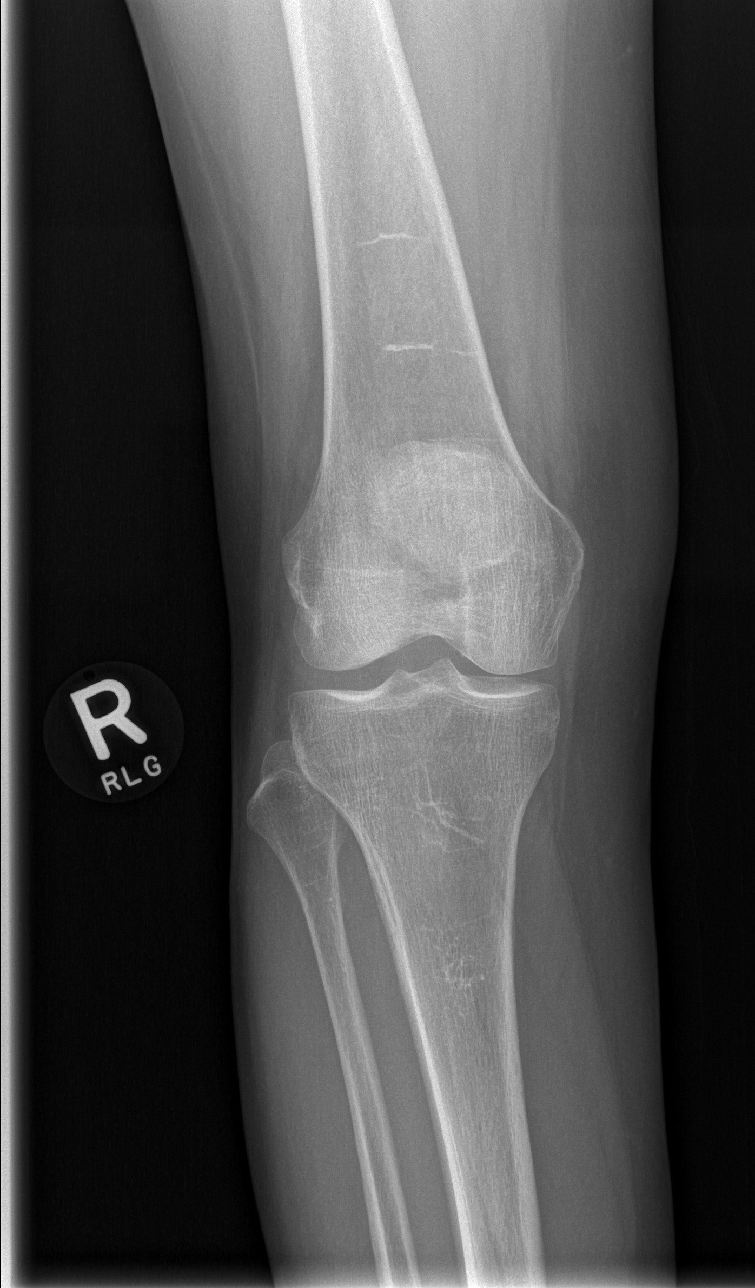

[t knee obl right (1 of 2)]
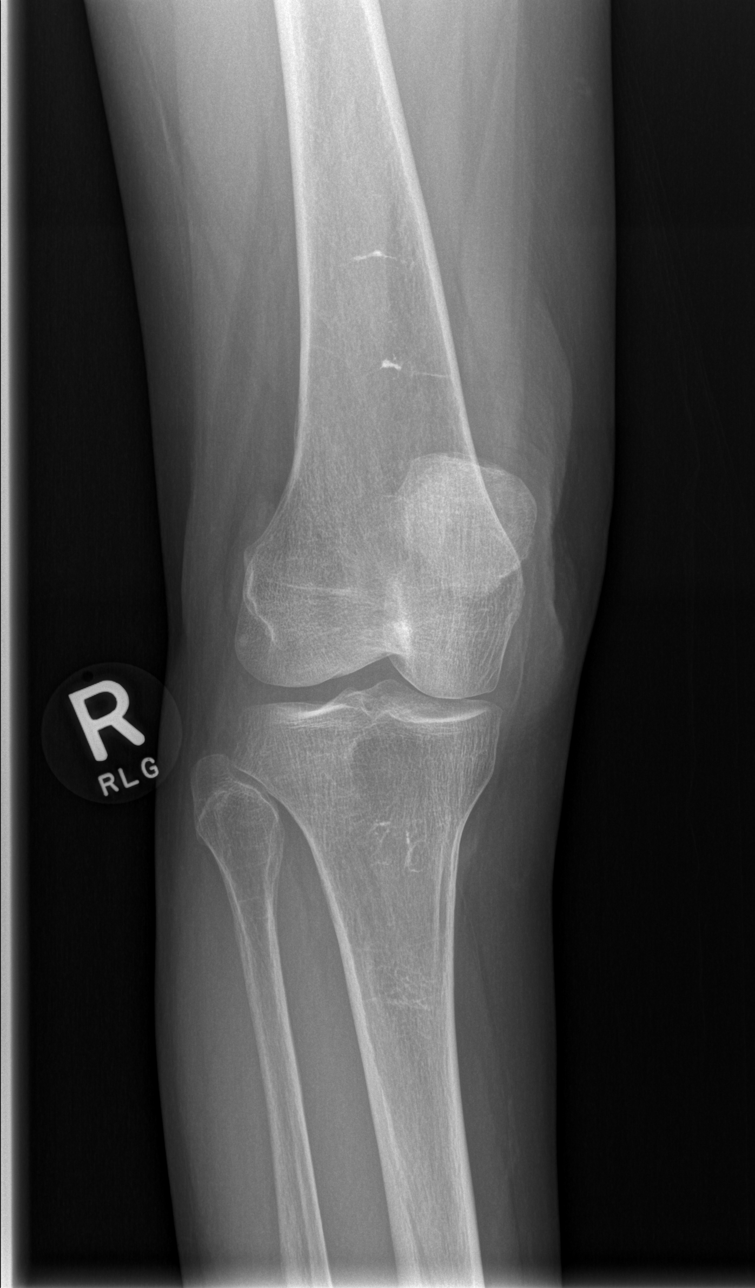

[t knee obl right (2 of 2)]
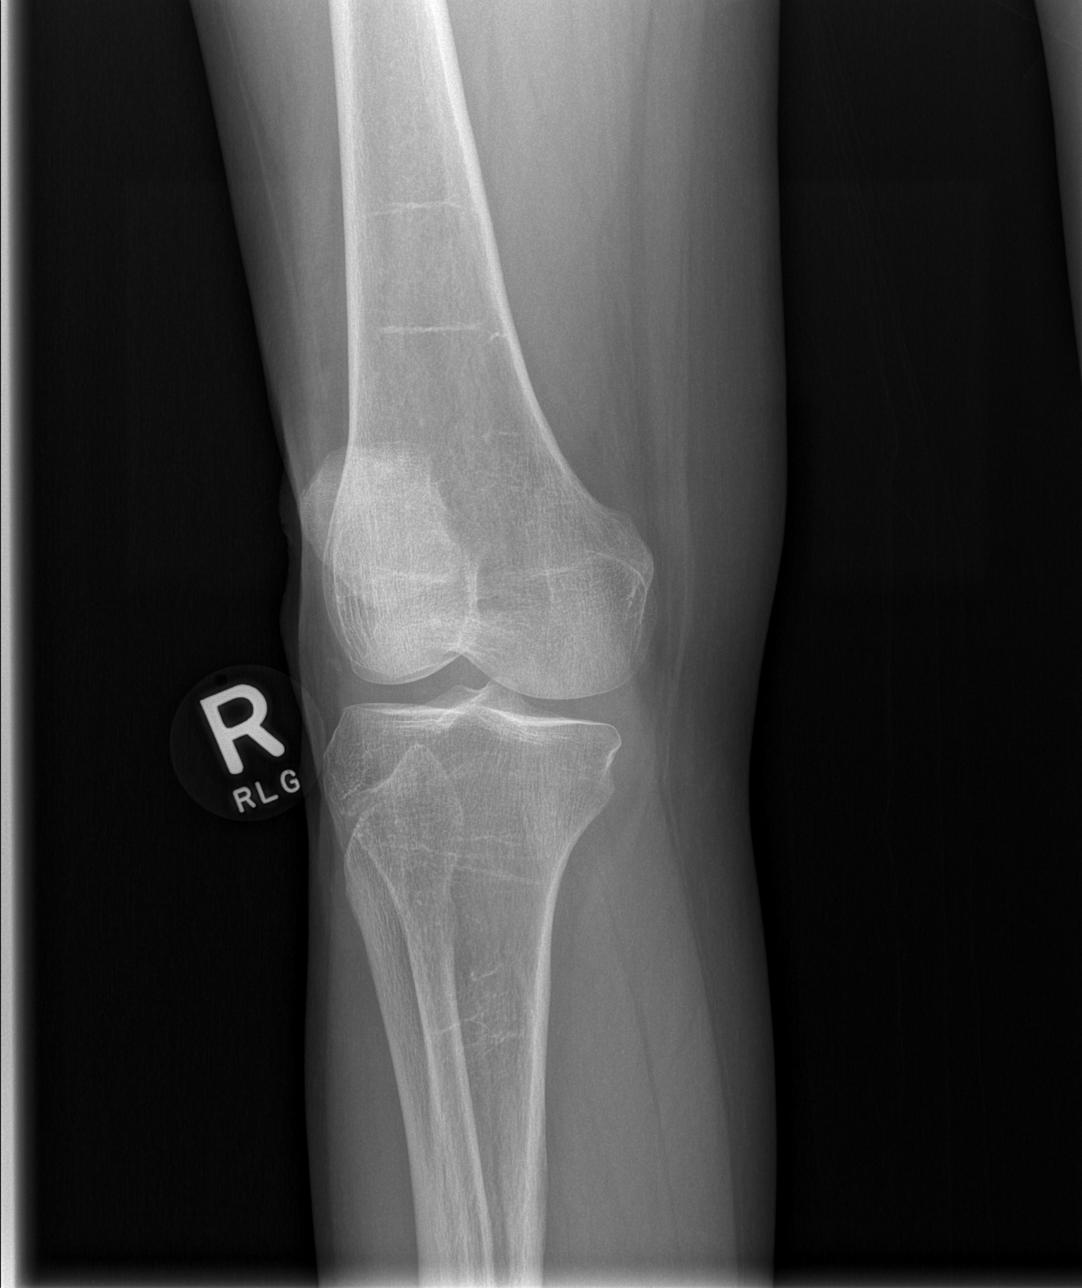

[t knee lat right]
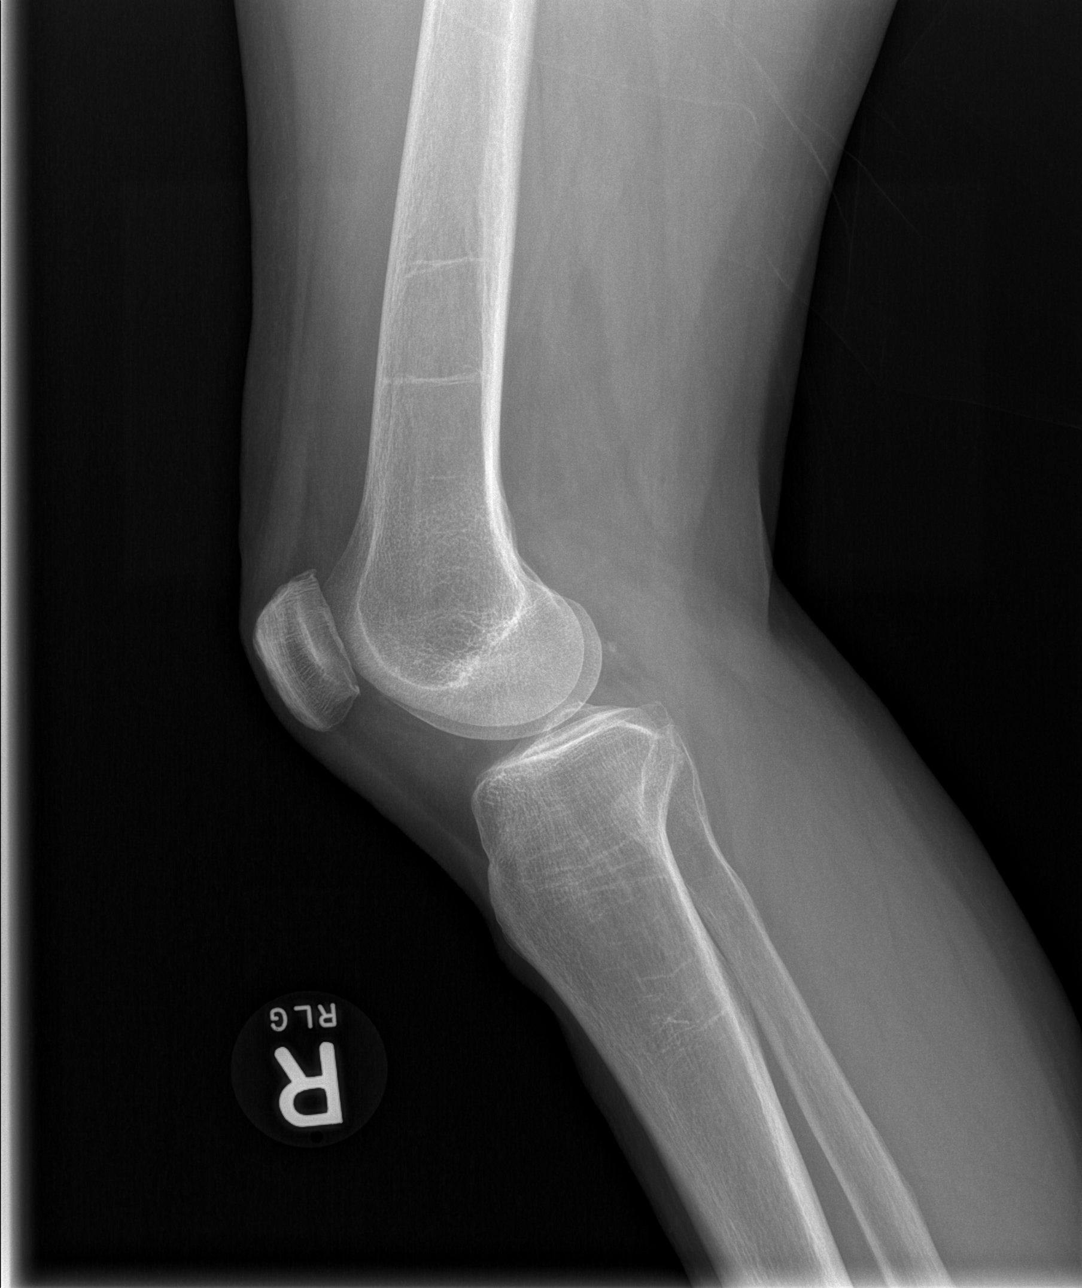

[4 of 4 positions shown; findings below may reference images not displayed]

FINDINGS: Subtle early degenerative change of the patellofemoral joint. No
evidence of acute fracture or dislocation. No significant joint
effusion.
IMPRESSION: No acute findings.

## 2021-09-24 ENCOUNTER — Other Ambulatory Visit: Payer: Self-pay

## 2021-09-24 ENCOUNTER — Ambulatory Visit: Payer: Medicare Other

## 2021-09-25 ENCOUNTER — Ambulatory Visit: Payer: Medicare Other | Admitting: Cardiology

## 2021-09-25 ENCOUNTER — Ambulatory Visit: Payer: Medicare Other

## 2021-09-26 NOTE — Patient Instructions (Addendum)
Asthma Continue  montelukast 10 mg once a day to prevent cough or wheeze. Refill sent Stop Advair HFA/ Start Breztri 2 puffs once a day with spacer to help prevent cough and wheeze.It is important to take this EVERY DAY. Continue Xopenex 2 puffs once every 4-6 hours as needed for cough or wheeze Continue Fasenra injections every 8 weeks and have access to an epinephrine autoinjector set. Consider switching to a different biologic drug if she continues to have symptoms  Allergic rhinitis Continue allergen avoidance measures directed toward mold, cat, and dog as listed below Continue allergen immunotherapy every 4 weeks and have access to an epinephrine autoinjector soft Start cetirizine 10 mg once a day as needed for runny nose Start Flonase 2 sprays in each nostril once a day as needed for a stuffy nose.In the right nostril, point the applicator out toward the right ear. In the left nostril, point the applicator out toward the left ear Consider saline nasal rinses as needed for nasal symptoms. Use this before any medicated nasal sprays for best result  Reflux Continue dietary and lifestyle modifications as listed below Continue Dexilant 60 mg once a day to control reflux  Stinging insect allergy Continue to avoid stinging insects. In case of an allergic reaction, take Benadryl 50 mg every 4 hours, and if life-threatening symptoms occur, inject with EpiPen 0.3 mg.  Tobacco Continue to cut down here daily cigarette smoking and try to quit smoking cigarettes  Acute sinus infection Start Augmentin 875 mg taking one tablet twice a day for 7 days Start prednisone 10 mg -taking 2 tablets twice a day for 3 days, on the 4th day take 2 tablets in the morning and on the 5th day take one tablet and stop   Call the clinic if this treatment plan is not working well for you  Follow up in 6 weeks or sooner if needed.  Control of Mold Allergen Mold and fungi can grow on a variety of surfaces  provided certain temperature and moisture conditions exist.  Outdoor molds grow on plants, decaying vegetation and soil.  The major outdoor mold, Alternaria and Cladosporium, are found in very high numbers during hot and dry conditions.  Generally, a late Summer - Fall peak is seen for common outdoor fungal spores.  Rain will temporarily lower outdoor mold spore count, but counts rise rapidly when the rainy period ends.  The most important indoor molds are Aspergillus and Penicillium.  Dark, humid and poorly ventilated basements are ideal sites for mold growth.  The next most common sites of mold growth are the bathroom and the kitchen.  Outdoor [employer] Use air conditioning and keep windows closed Avoid exposure to decaying vegetation. Avoid leaf raking. Avoid grain handling. Consider wearing a face mask if working in moldy areas.  Indoor Mold Control Maintain humidity below 50%. Clean washable surfaces with 5% bleach solution. Remove sources e.g. Contaminated carpets.  Control of Dog or Cat Allergen Avoidance is the best way to manage a dog or cat allergy. If you have a dog or cat and are allergic to dog or cats, consider removing the dog or cat from the home. If you have a dog or cat but dont want to find it a new home, or if your family wants a pet even though someone in the household is allergic, here are some strategies that may help keep symptoms at bay:  Keep the pet out of your bedroom and restrict it to only a few rooms.  Be advised that keeping the dog or cat in only one room will not limit the allergens to that room. Dont pet, hug or kiss the dog or cat; if you do, wash your hands with soap and water. High-efficiency particulate air (HEPA) cleaners run continuously in a bedroom or living room can reduce allergen levels over time. Regular use of a high-efficiency vacuum cleaner or a central vacuum can reduce allergen levels. Giving your dog or cat a bath at least once a week can  reduce airborne allergen.

## 2021-09-27 ENCOUNTER — Other Ambulatory Visit: Payer: Self-pay

## 2021-09-27 ENCOUNTER — Ambulatory Visit (INDEPENDENT_AMBULATORY_CARE_PROVIDER_SITE_OTHER): Payer: Medicare HMO | Admitting: Family

## 2021-09-27 ENCOUNTER — Ambulatory Visit: Payer: Medicare Other

## 2021-09-27 ENCOUNTER — Encounter: Payer: Self-pay | Admitting: Family

## 2021-09-27 VITALS — BP 112/64 | HR 83 | Temp 98.1°F | Resp 20 | Ht 62.0 in | Wt 120.8 lb

## 2021-09-27 DIAGNOSIS — Z91199 Patient's noncompliance with other medical treatment and regimen due to unspecified reason: Secondary | ICD-10-CM

## 2021-09-27 DIAGNOSIS — J302 Other seasonal allergic rhinitis: Secondary | ICD-10-CM

## 2021-09-27 DIAGNOSIS — J4550 Severe persistent asthma, uncomplicated: Secondary | ICD-10-CM

## 2021-09-27 DIAGNOSIS — Z91038 Other insect allergy status: Secondary | ICD-10-CM | POA: Diagnosis not present

## 2021-09-27 DIAGNOSIS — Z720 Tobacco use: Secondary | ICD-10-CM

## 2021-09-27 DIAGNOSIS — J019 Acute sinusitis, unspecified: Secondary | ICD-10-CM | POA: Diagnosis not present

## 2021-09-27 DIAGNOSIS — J309 Allergic rhinitis, unspecified: Secondary | ICD-10-CM

## 2021-09-27 DIAGNOSIS — J3089 Other allergic rhinitis: Secondary | ICD-10-CM

## 2021-09-27 MED ORDER — EPINEPHRINE 0.3 MG/0.3ML IJ SOAJ: INTRAMUSCULAR | 1 refills | Status: DC

## 2021-09-27 MED ORDER — AMOXICILLIN-POT CLAVULANATE 875-125 MG PO TABS: 1.0000 | ORAL_TABLET | Freq: Two times a day (BID) | ORAL | 0 refills | Status: AC

## 2021-09-27 MED ORDER — FLUTICASONE PROPIONATE 50 MCG/ACT NA SUSP: NASAL | 5 refills | Status: DC

## 2021-09-27 MED ORDER — DEXILANT 60 MG PO CPDR: 1.0000 | DELAYED_RELEASE_CAPSULE | Freq: Every day | ORAL | 1 refills | Status: DC

## 2021-09-27 MED ORDER — CETIRIZINE HCL 10 MG PO TABS
ORAL_TABLET | ORAL | 5 refills | Status: DC
Start: 2021-09-27 — End: 2021-12-04

## 2021-09-27 MED ORDER — MONTELUKAST SODIUM 10 MG PO TABS: 10.0000 mg | ORAL_TABLET | Freq: Every day | ORAL | 2 refills | Status: DC

## 2021-09-27 MED ORDER — BREZTRI AEROSPHERE 160-9-4.8 MCG/ACT IN AERO: INHALATION_SPRAY | RESPIRATORY_TRACT | 5 refills | Status: DC

## 2021-09-27 MED ORDER — LEVALBUTEROL TARTRATE 45 MCG/ACT IN AERO
INHALATION_SPRAY | RESPIRATORY_TRACT | 1 refills | Status: DC
Start: 2021-09-27 — End: 2021-12-04

## 2021-09-27 NOTE — Progress Notes (Signed)
[address] [place] [address] Dept: [phone]  FOLLOW UP NOTE  Patient ID: Lori [name], female    DOB: 16-Jan-1956  Age: 66 y.o. MRN: [id number] Date of Office Visit: 09/27/2021  Assessment  Chief Complaint: Asthma (Not too good. Would like to have pills in between here shot times. Wheezing, sob) and Allergic Rhinitis  (Not too good. Sinus stopped up, yellow mucous, coughing, sneezing. Just got over the flu. Patient think she has a sinus infection.)  HPI Lori [name] is a 66 year old female who presents today for follow-up of severe persistent asthma, asthma-copd overlap syndrome, non-seasonal allergic rhinitis due to fungal spores, compliance poor, tobacco abuse, and hymenoptera allergy.  She was last seen on November 02, 2020 by Gareth Morgan, FNP.  Since her last office visit she reports that she was in the hospital July 13 through 18 for not having any energy.  Epic shows her being admitted for near syncope, acute kidney injury, and intractable nausea and vomiting.  Severe persistent asthma is reported as not well controlled with Xopenex as needed and Fasenra injections every 8 weeks.  She is not having any problems or reactions with her Fasenra injections.  She has been out of montelukast 10 mg once a day for at least a month and does not have Dulera 200 mcg to use 2 puffs twice a day.  She mentions that in the past that [facility] along with Symbicort, and Advair were ineffective in controlling her symptoms.  She reports wheezing before it is time to receive her Fasenra injection, a productive cough with yellow sputum that she feels is coming from her nose, shortness of breath sometimes, and nocturnal awakenings sometimes due to breathing problems.  She denies any fever, chills, tightness in her chest.  Since her last office visit she has not required any systemic steroids and has not made any trips to the emergency room or urgent care due to breathing problems.  She uses her  albuterol inhaler approximately 2 times a day and sometimes it does not help.  She is asking for a pill to better control her asthma in between her Fasenra injections.  She also does not like dry powder inhalers.  Discussed the side effects of steroids such as osteoporosis, cataracts and reflux.  She does not wish to do spirometry today due to having a muscle pop under her right breast.  She reports that she is currently taking tramadol for this and it might be several months before this is better.  Allergic rhinitis is reported as not well controlled with allergy injections per protocol.  She does not currently take an antihistamine and does not have Flonase or any other steroid nasal sprays.  She feels that she has a sinus infection and is concerned because a long time ago she had a vein bust in her nose and it bled aot.  She reports yellow rhinorrhea for the past 1-1/2 weeks, nasal congestion, postnasal drip, sneezing, and a burning/stinging sensation inside the right side of her nose.  She has not had any sinus infections since we last saw her.  He does feel like her allergy injections help and she denies any reactions.  Reflux is reported as moderately controlled with Dexilant 60 mg once a day.  She reports it takes a while to kick in.  She has not had any insect stings since her last office visit and has not had to use her EpiPen.  She mentions that her EpiPen is not  up-to-date.  She continues to smoke approximately half a pack a day.  Discussed the benefits of stopping smoking.   Drug Allergies:  Allergies  Allergen Reactions   Bee Venom Swelling   Codeine Nausea Only and Other (See Comments)    CAUSES ULCERS   Ibuprofen Nausea And Vomiting   Clonidine Derivatives Other (See Comments)    Unknown    Statins    Tramadol Nausea Only   Latex Rash   Levofloxacin Nausea Only   Propoxyphene N-Acetaminophen Nausea Only    Review of Systems: Review of Systems  Constitutional:  Negative for  chills and fever.  HENT:         Reports sneezing, yellow rhinorrhea for the past 1 and half weeks, nasal congestion, postnasal drip, and a stinging/burning in the left side of her nostril  Eyes:        Reports watery eyes and denies itchy eyes  Respiratory:  Positive for cough, shortness of breath and wheezing.        Reports productive cough that is yellow in color.  Also reports shortness of breath sometimes, nocturnal awakenings sometimes, and wheezing before her Berna Bue injection is due.  She denies tightness in her chest.  Cardiovascular:  Negative for chest pain and palpitations.  Gastrointestinal:        She reports that Dexilant takes a while to kick in  Genitourinary:  Negative for frequency.  Skin:  Negative for itching and rash.  Neurological:  Positive for headaches.       Reports history of migraines  Endo/Heme/Allergies:  Positive for environmental allergies.    Physical Exam: BP 112/64    Pulse 83    Temp 98.1 F (36.7 C) (Temporal)    Resp 20    Ht 5\' 2"  (1.575 m)    Wt 120 lb 12.8 oz (54.8 kg)    SpO2 97%    BMI 22.09 kg/m    Physical Exam Exam conducted with a chaperone present.  Constitutional:      Appearance: Normal appearance.  HENT:     Head: Normocephalic and atraumatic.     Comments: Pharynx normal, eyes normal, ears normal, nose: Bilateral lower turbinates moderately edematous and slightly erythematous with no drainage noted.  Left turbinate greater than right turbinate.    Right Ear: Tympanic membrane, ear canal and external ear normal.     Left Ear: Tympanic membrane, ear canal and external ear normal.     Mouth/Throat:     Mouth: Mucous membranes are moist.     Pharynx: Oropharynx is clear.  Eyes:     Conjunctiva/sclera: Conjunctivae normal.  Cardiovascular:     Rate and Rhythm: Regular rhythm.     Heart sounds: Normal heart sounds.  Pulmonary:     Effort: Pulmonary effort is normal.     Breath sounds: Normal breath sounds.     Comments: Lungs  clear to auscultation Musculoskeletal:     Cervical back: Neck supple.  Skin:    General: Skin is warm.  Neurological:     Mental Status: She is alert and oriented to person, place, and time.  Psychiatric:        Mood and Affect: Mood normal.        Behavior: Behavior normal.        Thought Content: Thought content normal.        Judgment: Judgment normal.    Diagnostics:  Will get spirometry at next office visit. She did not want to have completed  due to a pulled muscle under her right breast.  Assessment and Plan: 1. Severe persistent asthma without complication   2. Acute non-recurrent sinusitis, unspecified location   3. Seasonal and perennial allergic rhinitis   4. Compliance poor   5. Tobacco abuse   6. Hymenoptera allergy     Meds ordered this encounter  Medications   montelukast (SINGULAIR) 10 MG tablet    Sig: Take 1 tablet (10 mg total) by mouth at bedtime.    Dispense:  90 tablet    Refill:  2    Requesting 1 year supply   Budeson-Glycopyrrol-Formoterol (BREZTRI AEROSPHERE) 160-9-4.8 MCG/ACT AERO    Sig: Inhale 2 puffs once a day with spacer to help prevent cough and wheeze.    Dispense:  10.7 g    Refill:  5   levalbuterol (XOPENEX HFA) 45 MCG/ACT inhaler    Sig: Inhale 2 puffs every 4-6 hours as needed for cough, wheeze, tightness in chest, or shortness of breath.    Dispense:  15 g    Refill:  1   EPINEPHrine 0.3 mg/0.3 mL IJ SOAJ injection    Sig: INJECT AS DIRECTED FOR SEVERE ALLERGIC REACTION    Dispense:  2 each    Refill:  1   cetirizine (ZYRTEC) 10 MG tablet    Sig: Take 1 tablet once a day as needed for runny nose.    Dispense:  31 tablet    Refill:  5   fluticasone (FLONASE) 50 MCG/ACT nasal spray    Sig: Place 2 sprays in each nostril once a day as needed for stuffy nose.    Dispense:  16 g    Refill:  5   DEXILANT 60 MG capsule    Sig: Take 1 capsule (60 mg total) by mouth daily.    Dispense:  90 capsule    Refill:  1    amoxicillin-clavulanate (AUGMENTIN) 875-125 MG tablet    Sig: Take 1 tablet by mouth 2 (two) times daily for 7 days.    Dispense:  14 tablet    Refill:  0    Patient Instructions  Asthma Continue  montelukast 10 mg once a day to prevent cough or wheeze. Refill sent Stop Advair HFA/ Start Breztri 2 puffs once a day with spacer to help prevent cough and wheeze.It is important to take this EVERY DAY. Continue Xopenex 2 puffs once every 4-6 hours as needed for cough or wheeze Continue Fasenra injections every 8 weeks and have access to an epinephrine autoinjector set. Consider switching to a different biologic drug if she continues to have symptoms  Allergic rhinitis Continue allergen avoidance measures directed toward mold, cat, and dog as listed below Continue allergen immunotherapy every 4 weeks and have access to an epinephrine autoinjector soft Start cetirizine 10 mg once a day as needed for runny nose Start Flonase 2 sprays in each nostril once a day as needed for a stuffy nose.In the right nostril, point the applicator out toward the right ear. In the left nostril, point the applicator out toward the left ear Consider saline nasal rinses as needed for nasal symptoms. Use this before any medicated nasal sprays for best result  Reflux Continue dietary and lifestyle modifications as listed below Continue Dexilant 60 mg once a day to control reflux  Stinging insect allergy Continue to avoid stinging insects. In case of an allergic reaction, take Benadryl 50 mg every 4 hours, and if life-threatening symptoms occur, inject with EpiPen 0.3 mg.  Tobacco Continue to cut down here daily cigarette smoking and try to quit smoking cigarettes  Acute sinus infection Start Augmentin 875 mg taking one tablet twice a day for 7 days Start prednisone 10 mg -taking 2 tablets twice a day for 3 days, on the 4th day take 2 tablets in the morning and on the 5th day take one tablet and stop   Call the  clinic if this treatment plan is not working well for you  Follow up in 6 weeks or sooner if needed.  Control of Mold Allergen Mold and fungi can grow on a variety of surfaces provided certain temperature and moisture conditions exist.  Outdoor molds grow on plants, decaying vegetation and soil.  The major outdoor mold, Alternaria and Cladosporium, are found in very high numbers during hot and dry conditions.  Generally, a late Summer - Fall peak is seen for common outdoor fungal spores.  Rain will temporarily lower outdoor mold spore count, but counts rise rapidly when the rainy period ends.  The most important indoor molds are Aspergillus and Penicillium.  Dark, humid and poorly ventilated basements are ideal sites for mold growth.  The next most common sites of mold growth are the bathroom and the kitchen.  Outdoor [employer] Use air conditioning and keep windows closed Avoid exposure to decaying vegetation. Avoid leaf raking. Avoid grain handling. Consider wearing a face mask if working in moldy areas.  Indoor Mold Control Maintain humidity below 50%. Clean washable surfaces with 5% bleach solution. Remove sources e.g. Contaminated carpets.  Control of Dog or Cat Allergen Avoidance is the best way to manage a dog or cat allergy. If you have a dog or cat and are allergic to dog or cats, consider removing the dog or cat from the home. If you have a dog or cat but dont want to find it a new home, or if your family wants a pet even though someone in the household is allergic, here are some strategies that may help keep symptoms at bay:  Keep the pet out of your bedroom and restrict it to only a few rooms. Be advised that keeping the dog or cat in only one room will not limit the allergens to that room. Dont pet, hug or kiss the dog or cat; if you do, wash your hands with soap and water. High-efficiency particulate air (HEPA) cleaners run continuously in a bedroom or living room can  reduce allergen levels over time. Regular use of a high-efficiency vacuum cleaner or a central vacuum can reduce allergen levels. Giving your dog or cat a bath at least once a week can reduce airborne allergen.   Return in about 6 weeks (around 11/08/2021), or if symptoms worsen or fail to improve.    Thank you for the opportunity to care for this patient.  Please do not hesitate to contact me with questions.  Althea Charon, FNP [facility]

## 2021-09-28 ENCOUNTER — Telehealth: Payer: Medicare HMO | Admitting: Internal Medicine

## 2021-09-28 ENCOUNTER — Telehealth: Payer: Self-pay | Admitting: Internal Medicine

## 2021-09-28 MED ORDER — ONDANSETRON HCL 4 MG PO TABS: 4.0000 mg | ORAL_TABLET | Freq: Three times a day (TID) | ORAL | 0 refills | Status: DC | PRN

## 2021-09-28 NOTE — Telephone Encounter (Signed)
Ok done to [facility] 

## 2021-09-28 NOTE — Telephone Encounter (Signed)
Patient notified

## 2021-09-28 NOTE — Telephone Encounter (Signed)
Patient states that she only has vomiting. Denies any flu like symptoms or fever/chills.

## 2021-09-28 NOTE — Telephone Encounter (Signed)
Patient calling to check status of rx request  Offered patient an appt, patient declined  *see below*

## 2021-09-28 NOTE — Telephone Encounter (Signed)
Patient calling in  Says she believes she may have possible food poisoning from Janine Limbo after eating a burrito bc she has been throwing up since 4am  Wants to know if provider can send in nausea medicine to pharmacy  [place], [place] Phone:  [phone]  Fax:  [phone]

## 2021-10-01 ENCOUNTER — Ambulatory Visit: Payer: Medicaid Other

## 2021-10-02 ENCOUNTER — Telehealth: Payer: Self-pay | Admitting: Internal Medicine

## 2021-10-02 ENCOUNTER — Ambulatory Visit (INDEPENDENT_AMBULATORY_CARE_PROVIDER_SITE_OTHER): Payer: Medicare HMO

## 2021-10-02 DIAGNOSIS — J309 Allergic rhinitis, unspecified: Secondary | ICD-10-CM | POA: Diagnosis not present

## 2021-10-02 NOTE — Telephone Encounter (Signed)
Connected to [facility] 1.13.23.  Caller states vomiting all morning since about 345 am; wants nausea meds called in; On meds for sinus infection and an infection in chest; swollen sinuses; Last urinated w/i 30 mins;  Advised to go to ED.

## 2021-10-02 NOTE — Telephone Encounter (Signed)
09/28/21 virtual was scheduled

## 2021-10-08 ENCOUNTER — Telehealth: Payer: Self-pay | Admitting: *Deleted

## 2021-10-08 NOTE — Telephone Encounter (Signed)
Patient had Ins change but no card given. I l/m for patient that I need info to give to pharmacy to process her Berna Bue

## 2021-10-08 NOTE — Telephone Encounter (Signed)
-----   Message from Herbie Drape, LPN sent at 5/45/6256  9:14 AM EST ----- Patient is wanting to come in to get her Berna Bue, she had her MD appt last week. please advise.

## 2021-10-09 ENCOUNTER — Ambulatory Visit (INDEPENDENT_AMBULATORY_CARE_PROVIDER_SITE_OTHER): Payer: Medicare HMO

## 2021-10-09 DIAGNOSIS — J309 Allergic rhinitis, unspecified: Secondary | ICD-10-CM

## 2021-10-09 NOTE — Telephone Encounter (Signed)
Spoke to patient and got her new Ins info and provided to [facility]. They are shipping her Berna Bue to office 1/25 and advised her she can call and make appt for injection

## 2021-10-12 ENCOUNTER — Ambulatory Visit (INDEPENDENT_AMBULATORY_CARE_PROVIDER_SITE_OTHER): Payer: Medicare HMO

## 2021-10-12 ENCOUNTER — Other Ambulatory Visit: Payer: Self-pay

## 2021-10-12 DIAGNOSIS — J4550 Severe persistent asthma, uncomplicated: Secondary | ICD-10-CM | POA: Diagnosis not present

## 2021-10-15 ENCOUNTER — Ambulatory Visit (INDEPENDENT_AMBULATORY_CARE_PROVIDER_SITE_OTHER): Payer: Medicare HMO

## 2021-10-15 DIAGNOSIS — J309 Allergic rhinitis, unspecified: Secondary | ICD-10-CM | POA: Diagnosis not present

## 2021-10-15 DIAGNOSIS — G43719 Chronic migraine without aura, intractable, without status migrainosus: Secondary | ICD-10-CM | POA: Diagnosis not present

## 2021-10-22 ENCOUNTER — Ambulatory Visit (INDEPENDENT_AMBULATORY_CARE_PROVIDER_SITE_OTHER): Payer: Medicare Other

## 2021-10-22 ENCOUNTER — Other Ambulatory Visit: Payer: Self-pay

## 2021-10-22 ENCOUNTER — Encounter: Payer: Self-pay | Admitting: Physician Assistant

## 2021-10-22 ENCOUNTER — Ambulatory Visit (INDEPENDENT_AMBULATORY_CARE_PROVIDER_SITE_OTHER): Payer: Medicare Other | Admitting: Physician Assistant

## 2021-10-22 DIAGNOSIS — M1811 Unilateral primary osteoarthritis of first carpometacarpal joint, right hand: Secondary | ICD-10-CM

## 2021-10-22 DIAGNOSIS — J309 Allergic rhinitis, unspecified: Secondary | ICD-10-CM | POA: Diagnosis not present

## 2021-10-22 MED ORDER — TRAMADOL HCL 50 MG PO TABS: 50.0000 mg | ORAL_TABLET | Freq: Four times a day (QID) | ORAL | 0 refills | Status: DC | PRN

## 2021-10-22 NOTE — Progress Notes (Signed)
Office Visit Note   Patient: Lori [name]           Date of Birth: 1956-03-11           MRN: [id number] Visit Date: 10/22/2021              Requested by: Biagio Borg, MD [address] [place],  [place] [address] PCP: Biagio Borg, MD  Chief Complaint  Patient presents with   Right Hand - Pain      HPI: Patient is a pleasant 66 year old woman with a 3-week history of right base of thumb pain.  She also has noticed some swelling.  She denies any injury.  She does have a history of first carpometacarpal arthritis.  This has been aspirated and injected in the past.  She does not quite recall this last time records today was in 2021.  She is having increasing pain and she is right-handed.  She is taking Tylenol and ibuprofen without relief in her symptoms  Assessment & Plan: Visit Diagnoses:  1. Arthritis of carpometacarpal [facility]) joint of right thumb     Plan: Arthritis CMC joint of the right thumb.  We will give her a thumb spica splint to use today to help relieve some of the pain.  Also will call her in a few tramadol which she has taken in the past.  She will visit with Dr. Tempie Donning later this week for further recommendations  Follow-Up Instructions: No follow-ups on file.   Ortho Exam  Patient is alert, oriented, no adenopathy, well-dressed, normal affect, normal respiratory effort. She has a palpable radial pulse no erythema she does have some soft tissue swelling at the base of the thumb.  She is hesitant to move her thumb because of the pain but can oppose her fingers.  Some tenderness in the anatomic snuffbox but more in the base of the thumb itself.  Sensation is intact.  No tenderness in the first webspace  Imaging: XR Wrist 2 Views Right  Result Date: 10/22/2021 Radiographs of her right wrist demonstrate no acute osseous fracture.  She does have arthritis changes at the MCP joint.  Well-maintained alignment no dislocations  No images are attached to the  encounter.  Labs: Lab Results  Component Value Date   HGBA1C 5.3 05/23/2021   HGBA1C 5.2 11/14/2020   HGBA1C 5.6 11/12/2019   ESRSEDRATE 18 06/30/2017   ESRSEDRATE 17 10/06/2012   ESRSEDRATE 21 12/08/2007   CRP 0.5 06/30/2017   CRP 1.5 10/06/2012   REPTSTATUS 11/01/2016 FINAL 10/26/2016   REPTSTATUS 11/01/2016 FINAL 10/26/2016   CULT  10/26/2016    NO GROWTH 5 DAYS Performed at [facility], [place] [address] [place], [place] [address]    CULT  10/26/2016    NO GROWTH 5 DAYS Performed at [facility] [address] [place], [place] [address]    LABORGA NO GROWTH 06/21/2016     Lab Results  Component Value Date   ALBUMIN 4.5 09/05/2021   ALBUMIN 4.3 05/23/2021   ALBUMIN 4.2 04/02/2021    Lab Results  Component Value Date   MG 2.1 04/02/2021   MG 2.3 10/29/2016   MG 1.5 (L) 10/26/2016   Lab Results  Component Value Date   VD25OH 16.81 (L) 05/23/2021   VD25OH 40.04 11/14/2020   VD25OH 15.17 (L) 11/12/2019    No results found for: PREALBUMIN CBC EXTENDED Latest Ref Rng & Units 09/05/2021 05/23/2021 04/02/2021  WBC 4.0 - 10.5  K/uL 8.7 8.1 8.0  RBC 3.87 - 5.11 Mil/uL 4.49 4.32 3.70(L)  HGB 12.0 - 15.0 g/dL 14.1 13.9 12.6  HCT 36.0 - 46.0 % 41.2 41.3 37.1  PLT 150.0 - 400.0 K/uL 243.0 269.0 205  NEUTROABS 1.4 - 7.7 K/uL 4.3 4.7 -  LYMPHSABS 0.7 - 4.0 K/uL 3.9 2.8 -     There is no height or weight on file to calculate BMI.  Orders:  Orders Placed This Encounter  Procedures   XR Wrist 2 Views Right   Meds ordered this encounter  Medications   traMADol (ULTRAM) 50 MG tablet    Sig: Take 1 tablet (50 mg total) by mouth every 6 (six) hours as needed.    Dispense:  60 tablet    Refill:  0     Procedures: No procedures performed  Clinical Data: No additional findings.  ROS:  All other systems negative, except as noted in the HPI. Review of Systems  Objective: Vital Signs: There were no vitals taken for this visit.  Specialty  Comments:  No specialty comments available.  PMFS History: Patient Active Problem List   Diagnosis Date Noted   Right-sided chest pain 09/05/2021   Neck pain 04/11/2021   Thoracic spine pain 04/11/2021   Low back pain 04/11/2021   Bilateral hip pain 04/11/2021   AKI (acute kidney injury) ([place]) 04/11/2021   Allergic rhinitis 01/08/2021   Abnormal urine odor 11/14/2020   Aortic atherosclerosis ([place]) 11/14/2020   Severe persistent asthma without complication 48/88/9169   Hymenoptera allergy 11/02/2020   Genital herpes 11/12/2019   Coccyx pain 11/12/2019   Insomnia 11/12/2019   Acute right-sided low back pain without sciatica 07/20/2019   Grief 07/20/2019   Vitamin D deficiency 07/20/2019   Hyperglycemia 04/21/2019   Left knee pain 04/21/2019   Pain 12/29/2018   B12 deficiency 10/20/2018   Bilateral hand pain 08/03/2018   Nausea 04/27/2018   Anemia, iron deficiency 04/27/2018   Intermittent paresthesia of hand and foot 04/27/2018   Headache 03/21/2018   Lumbar radiculitis 08/15/2017   Degenerative disc disease, lumbar 07/21/2017   Encounter for well adult exam with abnormal findings 06/30/2017   Epigastric pain 06/30/2017   Restrictive lung disease 06/30/2017   Leg cramping 06/09/2017   Elbow contusion 06/09/2017   Right elbow pain 05/27/2017   Asthma-COPD overlap syndrome ([place]) 01/31/2017   Seasonal and perennial allergic rhinitis 01/01/2017   Moderate persistent asthma, uncomplicated 45/11/8880   Pulmonary emphysema ([place]) 12/03/2016   Tobacco abuse 12/03/2016   Non-seasonal allergic rhinitis due to fungal spores 12/03/2016   Medication management 11/29/2016   Rash 11/23/2016   Vertigo 10/22/2016   Gastroesophageal reflux disease with esophagitis 10/22/2016   Non compliance w medication regimen 08/01/2016   Allergic contact dermatitis due to metals 08/01/2016   Migraine without aura and without status migrainosus, not intractable 07/22/2016   Spasm of muscle of lower  back 06/21/2016   Chronic rhinitis 06/19/2016   Compliance poor 06/19/2016   Gait difficulty 06/21/2015   Fibromyalgia 06/21/2015   Conversion reaction 06/21/2015   Depression 06/21/2015   Syncope and collapse 80/11/4915   Plica syndrome of left knee 01/06/2015   Anxiety state 09/06/2014   History of MI 09/06/2014   Cigarette smoker 06/27/2013   Borderline hypertension 06/27/2013   Chronic low back pain with sciatica 06/14/2013   Leg pain, bilateral 06/14/2013   Gastroparesis 10/26/2009   Musculoskeletal chest pain 05/02/2009   Hyperlipidemia LDL goal <100 11/18/2008   COPD exacerbation ([place])  11/18/2008   HEPATIC CYST 11/18/2008   Past Medical History:  Diagnosis Date   Allergic rhinitis    Anemia    Ankle fracture 05/2016   Anxiety    Arthritis    Asthma    Barrett esophagus 2007   Chest pain    Recurrent episodes.  Has had 3 negative nuclear stress test, and a completely normal coronary CT angiogram   COPD (chronic obstructive pulmonary disease) with chronic bronchitis (HCC)    & Emphysema   Depression    Duodenitis without mention of hemorrhage 2007   Esophageal reflux 2007   Esophageal stricture    Full dentures    H/O hiatal hernia    Hiatal hernia 2006,2007   Hyperlipidemia    Multiple fractures    from falls, fx rt. elbow, fx left wrist, bilateral ankles   Pneumonia 10/2016   Restrictive lung disease 06/30/2017   Ulcer     Family History  Problem Relation Age of Onset   Emphysema Father    Dementia Mother    Diabetes Maternal Grandmother    Diabetes Maternal Aunt    Diabetes Other        mat. cousin   Heart disease Brother    Colon cancer Neg Hx    Allergic rhinitis Neg Hx    Angioedema Neg Hx    Asthma Neg Hx    Atopy Neg Hx    Eczema Neg Hx    Immunodeficiency Neg Hx    Urticaria Neg Hx     Past Surgical History:  Procedure Laterality Date   ABDOMINAL HYSTERECTOMY     BSO   APPENDECTOMY     CESAREAN SECTION     x 2   CHOLECYSTECTOMY      CHONDROPLASTY Left 01/06/2015   Procedure: CHONDROPLASTY;  Surgeon: Marybelle Killings, MD;  Location: [place];  Service: Orthopedics;  Laterality: Left;   COLONOSCOPY     CT CTA CORONARY W/CA SCORE W/CM &/OR WO/CM  12/02/2016   CORONARY CALCIUM SCORE 0.  NO EVIDENCE OF CAD.  Normal-sized PA.  Normal pulmonary venous drainage the left atrium.  No ASD or VSD.   ELBOW FRACTURE SURGERY     right   KNEE ARTHROSCOPY WITH EXCISION PLICA Left 59/93/5701   Procedure: KNEE ARTHROSCOPY WITH EXCISION PLICA;  Surgeon: Marybelle Killings, MD;  Location: [place];  Service: Orthopedics;  Laterality: Left;   Lower Extremity Arterial Dopplers  07/06/2013   RABI 1.0, LABI 1.1.; No evidence of significant vascular atherogenic plaque   NECK SURGERY     NM MYOVIEW LTD  09/2014   a) LOW RISK. Normal EF of 65%, no RWMA. NO ISCHEMIA OR INFARCTION.;; b) 05/02/2009: Normal study.  LOW RISK.  No ischemia or infarction.  EF 74%.  Breast attenuation noted.   TONSILLECTOMY     TRANSTHORACIC ECHOCARDIOGRAM  03/28/2021   a) Normal EF 60 to 65%.  GR 1 DD.  No R WMA.  Normal RV size and function.  Normal RAP.  Normal valves. => NORMAL;; b) 04/23/16/2010: Normal study.  LOW RISK.  No ischemia or infarction.  EF 74%.  Breast attenuation noted.17/2010: ([facility]) EF>55%.  Normal wall motion.  Normal valves.   WRIST FRACTURE SURGERY     left, has plate   Social History   Occupational History   Occupation: disable  Tobacco Use   Smoking status: Every Day    Packs/day: 0.50    Years: 39.00    Pack  years: 19.50    Types: Cigarettes    Start date: 08/06/1977   Smokeless tobacco: Never  Vaping Use   Vaping Use: Never used  Substance and Sexual Activity   Alcohol use: No    Alcohol/week: 0.0 standard drinks   Drug use: No   Sexual activity: Not Currently

## 2021-10-25 ENCOUNTER — Encounter: Payer: Self-pay | Admitting: Orthopedic Surgery

## 2021-10-25 ENCOUNTER — Ambulatory Visit (INDEPENDENT_AMBULATORY_CARE_PROVIDER_SITE_OTHER): Payer: Medicare Other | Admitting: Orthopedic Surgery

## 2021-10-25 ENCOUNTER — Ambulatory Visit (INDEPENDENT_AMBULATORY_CARE_PROVIDER_SITE_OTHER): Payer: Medicare Other

## 2021-10-25 ENCOUNTER — Other Ambulatory Visit: Payer: Self-pay

## 2021-10-25 DIAGNOSIS — J309 Allergic rhinitis, unspecified: Secondary | ICD-10-CM

## 2021-10-25 DIAGNOSIS — M1811 Unilateral primary osteoarthritis of first carpometacarpal joint, right hand: Secondary | ICD-10-CM

## 2021-10-25 MED ORDER — MELOXICAM 7.5 MG PO TABS: 7.5000 mg | ORAL_TABLET | Freq: Every day | ORAL | 0 refills | Status: AC

## 2021-10-25 NOTE — Progress Notes (Signed)
Office Visit Note   Patient: Lori [name]           Date of Birth: [birth year]           MRN: [id number] Visit Date: 10/25/2021              Requested by: Biagio Borg, MD [place],  [place] [address] PCP: Biagio Borg, MD   Assessment & Plan: Visit Diagnoses:  1. Arthritis of carpometacarpal (CMC) joint of right thumb     Plan: We discussed the diagnosis, prognosis, non-operative and operative treatment options for CMC osteoarthritis.  After our discussion, the patient would like to proceed with activity modification, bracing, and oral NSAIDs.  We reviewed the risks and benefits of conservative management.  The patient expressed understanding of the reasoning and strategy going forward.  All patient questions and concerns were addressed.    Follow-Up Instructions: No follow-ups on file.   Orders:  No orders of the defined types were placed in this encounter.  No orders of the defined types were placed in this encounter.     Procedures: No procedures performed   Clinical Data: No additional findings.   Subjective: Chief Complaint  Patient presents with   Right Hand - Pain   Right Thumb - Pain    Is a 66 year old right-hand-dominant female who presents with right basilar thumb pain for several weeks.  She complains of pain and swelling at the base of her thumb.  It is, dull aching type of pain.  Denies any injury.  There were no cuts scrapes or other breaks in the skin.  She was seen previously by Bevely Palmer and was given a thumb spica brace and a prescription for tramadol.  She has difficulty with certain activities that involve pinching or gripping.  She denies pain elsewhere in her hand.   Review of Systems   Objective: Vital Signs: There were no vitals taken for this visit.  Physical Exam Constitutional:      Appearance: Normal appearance.  Cardiovascular:     Rate and Rhythm: Normal rate.     Pulses: Normal pulses.  Pulmonary:      Effort: Pulmonary effort is normal.  Skin:    General: Skin is warm and dry.     Capillary Refill: Capillary refill takes less than 2 seconds.  Neurological:     Mental Status: She is alert.    Right Hand Exam   Tenderness  Right hand tenderness location: TTP at base of thumb at [facility] joint.  Range of Motion  The patient has normal right wrist ROM.   Other  Erythema: absent Sensation: normal Pulse: present  Comments:  + CMC grind test.  No MP hyperextension.  No TTP at radial styloid w/ negative Wynn Maudlin test.      Specialty Comments:  No specialty comments available.  Imaging: No results found.   PMFS History: Patient Active Problem List   Diagnosis Date Noted   Arthritis of carpometacarpal [facility]) joint of right thumb 10/25/2021   Right-sided chest pain 09/05/2021   Neck pain 04/11/2021   Thoracic spine pain 04/11/2021   Low back pain 04/11/2021   Bilateral hip pain 04/11/2021   AKI (acute kidney injury) ([place]) 04/11/2021   Allergic rhinitis 01/08/2021   Abnormal urine odor 11/14/2020   Aortic atherosclerosis ([place]) 11/14/2020   Severe persistent asthma without complication 34/28/7681   Hymenoptera allergy 11/02/2020   Genital herpes 11/12/2019   Coccyx pain 11/12/2019  Insomnia 11/12/2019   Acute right-sided low back pain without sciatica 07/20/2019   Grief 07/20/2019   Vitamin D deficiency 07/20/2019   Hyperglycemia 04/21/2019   Left knee pain 04/21/2019   Pain 12/29/2018   B12 deficiency 10/20/2018   Bilateral hand pain 08/03/2018   Nausea 04/27/2018   Anemia, iron deficiency 04/27/2018   Intermittent paresthesia of hand and foot 04/27/2018   Headache 03/21/2018   Lumbar radiculitis 08/15/2017   Degenerative disc disease, lumbar 07/21/2017   Encounter for well adult exam with abnormal findings 06/30/2017   Epigastric pain 06/30/2017   Restrictive lung disease 06/30/2017   Leg cramping 06/09/2017   Elbow contusion 06/09/2017   Right elbow pain  05/27/2017   Asthma-COPD overlap syndrome ([place]) 01/31/2017   Seasonal and perennial allergic rhinitis 01/01/2017   Moderate persistent asthma, uncomplicated 52/77/8242   Pulmonary emphysema ([place]) 12/03/2016   Tobacco abuse 12/03/2016   Non-seasonal allergic rhinitis due to fungal spores 12/03/2016   Medication management 11/29/2016   Rash 11/23/2016   Vertigo 10/22/2016   Gastroesophageal reflux disease with esophagitis 10/22/2016   Non compliance w medication regimen 08/01/2016   Allergic contact dermatitis due to metals 08/01/2016   Migraine without aura and without status migrainosus, not intractable 07/22/2016   Spasm of muscle of lower back 06/21/2016   Chronic rhinitis 06/19/2016   Compliance poor 06/19/2016   Gait difficulty 06/21/2015   Fibromyalgia 06/21/2015   Conversion reaction 06/21/2015   Depression 06/21/2015   Syncope and collapse 35/36/1443   Plica syndrome of left knee 01/06/2015   Anxiety state 09/06/2014   History of MI 09/06/2014   Cigarette smoker 06/27/2013   Borderline hypertension 06/27/2013   Chronic low back pain with sciatica 06/14/2013   Leg pain, bilateral 06/14/2013   Gastroparesis 10/26/2009   Musculoskeletal chest pain 05/02/2009   Hyperlipidemia LDL goal <100 11/18/2008   COPD exacerbation ([place]) 11/18/2008   HEPATIC CYST 11/18/2008   Past Medical History:  Diagnosis Date   Allergic rhinitis    Anemia    Ankle fracture 05/2016   Anxiety    Arthritis    Asthma    Barrett esophagus 2007   Chest pain    Recurrent episodes.  Has had 3 negative nuclear stress test, and a completely normal coronary CT angiogram   COPD (chronic obstructive pulmonary disease) with chronic bronchitis (HCC)    & Emphysema   Depression    Duodenitis without mention of hemorrhage 2007   Esophageal reflux 2007   Esophageal stricture    Full dentures    H/O hiatal hernia    Hiatal hernia 2006,2007   Hyperlipidemia    Multiple fractures    from falls, fx rt.  elbow, fx left wrist, bilateral ankles   Pneumonia 10/2016   Restrictive lung disease 06/30/2017   Ulcer     Family History  Problem Relation Age of Onset   Emphysema Father    Dementia Mother    Diabetes Maternal Grandmother    Diabetes Maternal Aunt    Diabetes Other        mat. cousin   Heart disease Brother    Colon cancer Neg Hx    Allergic rhinitis Neg Hx    Angioedema Neg Hx    Asthma Neg Hx    Atopy Neg Hx    Eczema Neg Hx    Immunodeficiency Neg Hx    Urticaria Neg Hx     Past Surgical History:  Procedure Laterality Date   ABDOMINAL HYSTERECTOMY  BSO   APPENDECTOMY     CESAREAN SECTION     x 2   CHOLECYSTECTOMY     CHONDROPLASTY Left 01/06/2015   Procedure: CHONDROPLASTY;  Surgeon: Marybelle Killings, MD;  Location: [place];  Service: Orthopedics;  Laterality: Left;   COLONOSCOPY     CT CTA CORONARY W/CA SCORE W/CM &/OR WO/CM  12/02/2016   CORONARY CALCIUM SCORE 0.  NO EVIDENCE OF CAD.  Normal-sized PA.  Normal pulmonary venous drainage the left atrium.  No ASD or VSD.   ELBOW FRACTURE SURGERY     right   KNEE ARTHROSCOPY WITH EXCISION PLICA Left 90/30/0923   Procedure: KNEE ARTHROSCOPY WITH EXCISION PLICA;  Surgeon: Marybelle Killings, MD;  Location: [place];  Service: Orthopedics;  Laterality: Left;   Lower Extremity Arterial Dopplers  07/06/2013   RABI 1.0, LABI 1.1.; No evidence of significant vascular atherogenic plaque   NECK SURGERY     NM MYOVIEW LTD  09/2014   a) LOW RISK. Normal EF of 65%, no RWMA. NO ISCHEMIA OR INFARCTION.;; b) 05/02/2009: Normal study.  LOW RISK.  No ischemia or infarction.  EF 74%.  Breast attenuation noted.   TONSILLECTOMY     TRANSTHORACIC ECHOCARDIOGRAM  03/28/2021   a) Normal EF 60 to 65%.  GR 1 DD.  No R WMA.  Normal RV size and function.  Normal RAP.  Normal valves. => NORMAL;; b) 04/23/16/2010: Normal study.  LOW RISK.  No ischemia or infarction.  EF 74%.  Breast attenuation noted.17/2010: ([facility])  EF>55%.  Normal wall motion.  Normal valves.   WRIST FRACTURE SURGERY     left, has plate   Social History   Occupational History   Occupation: disable  Tobacco Use   Smoking status: Every Day    Packs/day: 0.50    Years: 39.00    Pack years: 19.50    Types: Cigarettes    Start date: 08/06/1977   Smokeless tobacco: Never  Vaping Use   Vaping Use: Never used  Substance and Sexual Activity   Alcohol use: No    Alcohol/week: 0.0 standard drinks   Drug use: No   Sexual activity: Not Currently

## 2021-11-12 ENCOUNTER — Encounter: Payer: Self-pay | Admitting: Orthopedic Surgery

## 2021-11-12 ENCOUNTER — Other Ambulatory Visit: Payer: Self-pay

## 2021-11-12 ENCOUNTER — Ambulatory Visit: Payer: Self-pay

## 2021-11-12 ENCOUNTER — Ambulatory Visit (INDEPENDENT_AMBULATORY_CARE_PROVIDER_SITE_OTHER): Payer: Medicare Other | Admitting: Orthopedic Surgery

## 2021-11-12 VITALS — Ht 62.0 in | Wt 120.0 lb

## 2021-11-12 DIAGNOSIS — M1811 Unilateral primary osteoarthritis of first carpometacarpal joint, right hand: Secondary | ICD-10-CM | POA: Diagnosis not present

## 2021-11-12 NOTE — Progress Notes (Signed)
Office Visit Note   Patient: Lori [name]           Date of Birth: 10/18/1955           MRN: [id number] Visit Date: 11/12/2021              Requested by: Biagio Borg, MD [address] [place],  [place] [address] PCP: Biagio Borg, MD   Assessment & Plan: Visit Diagnoses:  1. Arthritis of carpometacarpal (CMC) joint of right thumb     Plan: We again discussed the diagnosis, prognosis, and both conservative and operative treatment options for thumb cmc osteoarthritis.  After our discussion, the patient has elected to proceed with [facility] arthroplasty.  We reviewed the benefits of surgery and the potential risks including, but not limited to, persistent symptoms, infection, damage to nearby nerves and blood vessels, delayed wound healing, need for additional surgery.    All patient concerns and questions were addressed.  A surgical date will be confirmed with the patient.    Follow-Up Instructions: No follow-ups on file.   Orders:  Orders Placed This Encounter  Procedures   XR Finger Thumb Right   No orders of the defined types were placed in this encounter.     Procedures: No procedures performed   Clinical Data: No additional findings.   Subjective: Chief Complaint  Patient presents with   Right Hand - Follow-up, Pain    This is a 66 yo RHD F who presents w/ continued pain at the base of her right thumb at the [facility] joint.  She was last seen just over 2 weeks ago.  She has been trying oral NSAIDs and bracing with minimal symptom relief.  She is wearing her brace today.  The pain seems to be worsening and is keeping her awake at night.  She is not able to do her usual daily activities secondary to pain.    Review of Systems   Objective: Vital Signs: Ht 5\' 2"  (1.575 m)    Wt 120 lb (54.4 kg)    BMI 21.95 kg/m   Physical Exam Constitutional:      Appearance: Normal appearance.  Cardiovascular:     Rate and Rhythm: Normal rate.     Pulses: Normal  pulses.  Pulmonary:     Effort: Pulmonary effort is normal.  Skin:    General: Skin is warm and dry.     Capillary Refill: Capillary refill takes less than 2 seconds.  Neurological:     Mental Status: She is alert.    Right Hand Exam   Tenderness  Right hand tenderness location: Severely TTP at base of thumb at [facility] joint.  Other  Erythema: absent Sensation: normal Pulse: present  Comments:  Significant pain w/ CMC grind test.  No static MP hyper-extension.  No TTP at radial styloid w/ negative Finkelstein test.  No palmar abduction contracture.      Specialty Comments:  No specialty comments available.  Imaging: No results found.   PMFS History: Patient Active Problem List   Diagnosis Date Noted   Arthritis of carpometacarpal [facility]) joint of right thumb 10/25/2021   Right-sided chest pain 09/05/2021   Neck pain 04/11/2021   Thoracic spine pain 04/11/2021   Low back pain 04/11/2021   Bilateral hip pain 04/11/2021   AKI (acute kidney injury) ([place]) 04/11/2021   Allergic rhinitis 01/08/2021   Abnormal urine odor 11/14/2020   Aortic atherosclerosis ([place]) 11/14/2020   Severe persistent asthma without complication  11/02/2020   Hymenoptera allergy 11/02/2020   Genital herpes 11/12/2019   Coccyx pain 11/12/2019   Insomnia 11/12/2019   Acute right-sided low back pain without sciatica 07/20/2019   Grief 07/20/2019   Vitamin D deficiency 07/20/2019   Hyperglycemia 04/21/2019   Left knee pain 04/21/2019   Pain 12/29/2018   B12 deficiency 10/20/2018   Bilateral hand pain 08/03/2018   Nausea 04/27/2018   Anemia, iron deficiency 04/27/2018   Intermittent paresthesia of hand and foot 04/27/2018   Headache 03/21/2018   Lumbar radiculitis 08/15/2017   Degenerative disc disease, lumbar 07/21/2017   Encounter for well adult exam with abnormal findings 06/30/2017   Epigastric pain 06/30/2017   Restrictive lung disease 06/30/2017   Leg cramping 06/09/2017   Elbow contusion  06/09/2017   Right elbow pain 05/27/2017   Asthma-COPD overlap syndrome ([place]) 01/31/2017   Seasonal and perennial allergic rhinitis 01/01/2017   Moderate persistent asthma, uncomplicated 46/27/0350   Pulmonary emphysema ([place]) 12/03/2016   Tobacco abuse 12/03/2016   Non-seasonal allergic rhinitis due to fungal spores 12/03/2016   Medication management 11/29/2016   Rash 11/23/2016   Vertigo 10/22/2016   Gastroesophageal reflux disease with esophagitis 10/22/2016   Non compliance w medication regimen 08/01/2016   Allergic contact dermatitis due to metals 08/01/2016   Migraine without aura and without status migrainosus, not intractable 07/22/2016   Spasm of muscle of lower back 06/21/2016   Chronic rhinitis 06/19/2016   Compliance poor 06/19/2016   Gait difficulty 06/21/2015   Fibromyalgia 06/21/2015   Conversion reaction 06/21/2015   Depression 06/21/2015   Syncope and collapse 09/38/1829   Plica syndrome of left knee 01/06/2015   Anxiety state 09/06/2014   History of MI 09/06/2014   Cigarette smoker 06/27/2013   Borderline hypertension 06/27/2013   Chronic low back pain with sciatica 06/14/2013   Leg pain, bilateral 06/14/2013   Gastroparesis 10/26/2009   Musculoskeletal chest pain 05/02/2009   Hyperlipidemia LDL goal <100 11/18/2008   COPD exacerbation ([place]) 11/18/2008   HEPATIC CYST 11/18/2008   Past Medical History:  Diagnosis Date   Allergic rhinitis    Anemia    Ankle fracture 05/2016   Anxiety    Arthritis    Asthma    Barrett esophagus 2007   Chest pain    Recurrent episodes.  Has had 3 negative nuclear stress test, and a completely normal coronary CT angiogram   COPD (chronic obstructive pulmonary disease) with chronic bronchitis (HCC)    & Emphysema   Depression    Duodenitis without mention of hemorrhage 2007   Esophageal reflux 2007   Esophageal stricture    Full dentures    H/O hiatal hernia    Hiatal hernia 2006,2007   Hyperlipidemia    Multiple  fractures    from falls, fx rt. elbow, fx left wrist, bilateral ankles   Pneumonia 10/2016   Restrictive lung disease 06/30/2017   Ulcer     Family History  Problem Relation Age of Onset   Emphysema Father    Dementia Mother    Diabetes Maternal Grandmother    Diabetes Maternal Aunt    Diabetes Other        mat. cousin   Heart disease Brother    Colon cancer Neg Hx    Allergic rhinitis Neg Hx    Angioedema Neg Hx    Asthma Neg Hx    Atopy Neg Hx    Eczema Neg Hx    Immunodeficiency Neg Hx    Urticaria Neg  Hx     Past Surgical History:  Procedure Laterality Date   ABDOMINAL HYSTERECTOMY     BSO   APPENDECTOMY     CESAREAN SECTION     x 2   CHOLECYSTECTOMY     CHONDROPLASTY Left 01/06/2015   Procedure: CHONDROPLASTY;  Surgeon: Marybelle Killings, MD;  Location: [facility];  Service: Orthopedics;  Laterality: Left;   COLONOSCOPY     CT CTA CORONARY W/CA SCORE W/CM &/OR WO/CM  12/02/2016   CORONARY CALCIUM SCORE 0.  NO EVIDENCE OF CAD.  Normal-sized PA.  Normal pulmonary venous drainage the left atrium.  No ASD or VSD.   ELBOW FRACTURE SURGERY     right   KNEE ARTHROSCOPY WITH EXCISION PLICA Left 87/86/7672   Procedure: KNEE ARTHROSCOPY WITH EXCISION PLICA;  Surgeon: Marybelle Killings, MD;  Location: [facility];  Service: Orthopedics;  Laterality: Left;   Lower Extremity Arterial Dopplers  07/06/2013   RABI 1.0, LABI 1.1.; No evidence of significant vascular atherogenic plaque   NECK SURGERY     NM MYOVIEW LTD  09/2014   a) LOW RISK. Normal EF of 65%, no RWMA. NO ISCHEMIA OR INFARCTION.;; b) 05/02/2009: Normal study.  LOW RISK.  No ischemia or infarction.  EF 74%.  Breast attenuation noted.   TONSILLECTOMY     TRANSTHORACIC ECHOCARDIOGRAM  03/28/2021   a) Normal EF 60 to 65%.  GR 1 DD.  No R WMA.  Normal RV size and function.  Normal RAP.  Normal valves. => NORMAL;; b) 04/23/16/2010: Normal study.  LOW RISK.  No ischemia or infarction.  EF 74%.  Breast  attenuation noted.17/2010: ([facility]) EF>55%.  Normal wall motion.  Normal valves.   WRIST FRACTURE SURGERY     left, has plate   Social History   Occupational History   Occupation: disable  Tobacco Use   Smoking status: Every Day    Packs/day: 0.50    Years: 39.00    Pack years: 19.50    Types: Cigarettes    Start date: 08/06/1977   Smokeless tobacco: Never  Vaping Use   Vaping Use: Never used  Substance and Sexual Activity   Alcohol use: No    Alcohol/week: 0.0 standard drinks   Drug use: No   Sexual activity: Not Currently

## 2021-11-13 ENCOUNTER — Ambulatory Visit: Payer: Self-pay

## 2021-11-13 ENCOUNTER — Telehealth: Payer: Self-pay

## 2021-11-13 ENCOUNTER — Encounter: Payer: Self-pay | Admitting: Gastroenterology

## 2021-11-13 ENCOUNTER — Ambulatory Visit (INDEPENDENT_AMBULATORY_CARE_PROVIDER_SITE_OTHER): Payer: Medicare Other | Admitting: Allergy & Immunology

## 2021-11-13 ENCOUNTER — Encounter: Payer: Self-pay | Admitting: Allergy & Immunology

## 2021-11-13 VITALS — BP 120/72 | HR 82 | Temp 98.2°F | Resp 18 | Ht 62.0 in | Wt 122.4 lb

## 2021-11-13 DIAGNOSIS — J309 Allergic rhinitis, unspecified: Secondary | ICD-10-CM

## 2021-11-13 DIAGNOSIS — J3089 Other allergic rhinitis: Secondary | ICD-10-CM

## 2021-11-13 DIAGNOSIS — J449 Chronic obstructive pulmonary disease, unspecified: Secondary | ICD-10-CM | POA: Diagnosis not present

## 2021-11-13 DIAGNOSIS — Z91199 Patient's noncompliance with other medical treatment and regimen due to unspecified reason: Secondary | ICD-10-CM

## 2021-11-13 DIAGNOSIS — Z720 Tobacco use: Secondary | ICD-10-CM

## 2021-11-13 DIAGNOSIS — G43719 Chronic migraine without aura, intractable, without status migrainosus: Secondary | ICD-10-CM | POA: Diagnosis not present

## 2021-11-13 DIAGNOSIS — J302 Other seasonal allergic rhinitis: Secondary | ICD-10-CM

## 2021-11-13 MED ORDER — BUDESONIDE 0.25 MG/2ML IN SUSP: 0.2500 mg | Freq: Three times a day (TID) | RESPIRATORY_TRACT | 2 refills | Status: DC | PRN

## 2021-11-13 NOTE — Patient Instructions (Addendum)
1. Moderate persistent asthma, uncomplicated - Lung testing not done today, but your lungs SOUNDED PRISTINE!  - Start a budesonide mixed with Xopenex three times daily and continue for 1-2 weeks. - Start Mucinex 1200mg  twice daily for 1-2 weeks. - Give [place] an update in a few days.  - I do NOT think that you need an antibiotic at this point since you had one LAST MONTH. - Using antibiotics too frequently creates antibiotic resistance and can lead to worsening infections in the future.  - Daily controller medication(s): Singulair (montelukast) 10mg  daily + Breztri two puffs once daily with spacer + Fasenra every 8 weeks.   - Rescue medications: Xopenex 4 puffs every 4-6 hours as needed or Xopenex nebulizer one vial puffs every 4-6 hours as needed - Asthma control goals:  * Full participation in all desired activities (may need albuterol before activity) * Albuterol use two time or less a week on average (not counting use with activity) * Cough interfering with sleep two time or less a month * Oral steroids no more than once a year * No hospitalizations   2. Perennial allergic rhinitis  - Continue with shots every four weeks.  - Continue with Singulair 10mg  daily. - Continue with cetirizine (Zyrtec) 10mg  daily.  3.  Return in about 6 months (around 05/13/2022).   Please inform us of any Emergency Department visits, hospitalizations, or changes in symptoms. Call us before going to the ED for breathing or allergy symptoms since we might be able to fit you in for a sick visit. Feel free to contact us anytime with any questions, problems, or concerns.  It was a pleasure to see you again today!   Websites that have reliable patient information: 1. American Academy of Asthma, Allergy, and Immunology: www.aaaai.org 2. Food Allergy Research and Education (FARE): foodallergy.org 3. Mothers of Asthmatics: http://www.asthmacommunitynetwork.org 4. American College of Allergy, Asthma, and Immunology:  www.acaai.org

## 2021-11-13 NOTE — Telephone Encounter (Signed)
° °  Pre-operative Risk Assessment    Patient Name: Lori [name]  DOB: 05-Sep-1956 MRN: [id number]      Request for Surgical Clearance    Procedure:   Right thumb ligament reconstruction, and tendon interposition   Date of Surgery:  Clearance TBD                                 Surgeon:  Dr. Sherilyn Cooter Surgeon's Group or Practice Name:  [facility] Phone number:  [phone]  Fax number:  [phone]   Type of Clearance Requested:   - Medical    Type of Anesthesia:   Choice    Additional requests/questions:    SignedJacqulynn Cadet   11/13/2021, 2:51 PM

## 2021-11-13 NOTE — Progress Notes (Signed)
FOLLOW UP  Date of Service/Encounter:  11/13/21   Assessment:   Seasonal and perennial allergic rhinitis (mold, cat, dog) - on allergen immunotherapy every 2 weeks with good results   Cigarette smoker   Moderate persistent asthma with COPD overlap - seems stabilized on Fasenra    Poor compliance   Complex medical history   Recent death of spouse 2019-06-30) and brother (March 2021)  Plan/Recommendations:   1. Moderate persistent asthma, uncomplicated - Lung testing not done today, but your lungs SOUNDED PRISTINE!  - Start a budesonide mixed with Xopenex three times daily and continue for 1-2 weeks. - Start Mucinex 1200mg  twice daily for 1-2 weeks. - Give [place] an update in a few days.  - I do NOT think that you need an antibiotic at this point since you had one LAST MONTH. - Using antibiotics too frequently creates antibiotic resistance and can lead to worsening infections in the future.  - Daily controller medication(s): Singulair (montelukast) 10mg  daily + Breztri two puffs once daily with spacer + Fasenra every 8 weeks.   - Rescue medications: Xopenex 4 puffs every 4-6 hours as needed or Xopenex nebulizer one vial puffs every 4-6 hours as needed - Asthma control goals:  * Full participation in all desired activities (may need albuterol before activity) * Albuterol use two time or less a week on average (not counting use with activity) * Cough interfering with sleep two time or less a month * Oral steroids no more than once a year * No hospitalizations   2. Perennial allergic rhinitis  - Continue with shots every four weeks.  - Continue with Singulair 10mg  daily. - Continue with cetirizine (Zyrtec) 10mg  daily.  3.  Return in about 6 months (around 05/13/2022).    Subjective:   Lori [name] is a 66 y.o. female presenting today for follow up of  Chief Complaint  Patient presents with   Follow-up    Lori [name] has a history of the following: Patient  Active Problem List   Diagnosis Date Noted   Arthritis of carpometacarpal [facility]) joint of right thumb 10/25/2021   Right-sided chest pain 09/05/2021   Neck pain 04/11/2021   Thoracic spine pain 04/11/2021   Low back pain 04/11/2021   Bilateral hip pain 04/11/2021   AKI (acute kidney injury) ([place]) 04/11/2021   Allergic rhinitis 01/08/2021   Abnormal urine odor 11/14/2020   Aortic atherosclerosis ([place]) 11/14/2020   Severe persistent asthma without complication 03/50/0938   Hymenoptera allergy 11/02/2020   Genital herpes 11/12/2019   Coccyx pain 11/12/2019   Insomnia 11/12/2019   Acute right-sided low back pain without sciatica 07/20/2019   Grief 07/20/2019   Vitamin D deficiency 07/20/2019   Hyperglycemia 04/21/2019   Left knee pain 04/21/2019   Pain 12/29/2018   B12 deficiency 10/20/2018   Bilateral hand pain 08/03/2018   Nausea 04/27/2018   Anemia, iron deficiency 04/27/2018   Intermittent paresthesia of hand and foot 04/27/2018   Headache 03/21/2018   Lumbar radiculitis 08/15/2017   Degenerative disc disease, lumbar 07/21/2017   Encounter for well adult exam with abnormal findings 06/30/2017   Epigastric pain 06/30/2017   Restrictive lung disease 06/30/2017   Leg cramping 06/09/2017   Elbow contusion 06/09/2017   Right elbow pain 05/27/2017   Asthma-COPD overlap syndrome ([place]) 01/31/2017   Seasonal and perennial allergic rhinitis 01/01/2017   Moderate persistent asthma, uncomplicated 18/29/9371   Pulmonary emphysema ([place]) 12/03/2016   Tobacco abuse 12/03/2016   Non-seasonal  allergic rhinitis due to fungal spores 12/03/2016   Medication management 11/29/2016   Rash 11/23/2016   Vertigo 10/22/2016   Gastroesophageal reflux disease with esophagitis 10/22/2016   Non compliance w medication regimen 08/01/2016   Allergic contact dermatitis due to metals 08/01/2016   Migraine without aura and without status migrainosus, not intractable 07/22/2016   Spasm of muscle of lower  back 06/21/2016   Chronic rhinitis 06/19/2016   Compliance poor 06/19/2016   Gait difficulty 06/21/2015   Fibromyalgia 06/21/2015   Conversion reaction 06/21/2015   Depression 06/21/2015   Syncope and collapse 32/67/1245   Plica syndrome of left knee 01/06/2015   Anxiety state 09/06/2014   History of MI 09/06/2014   Cigarette smoker 06/27/2013   Borderline hypertension 06/27/2013   Chronic low back pain with sciatica 06/14/2013   Leg pain, bilateral 06/14/2013   Gastroparesis 10/26/2009   Musculoskeletal chest pain 05/02/2009   Hyperlipidemia LDL goal <100 11/18/2008   COPD exacerbation ([place]) 11/18/2008   HEPATIC CYST 11/18/2008    History obtained from: chart review and patient.  Al is a 66 y.o. female presenting for a follow up visit.  She was last seen in January 2023.  At that time, she seemed to be doing well without ER visits and hospitalizations.  She remains on the [place] but was not compliant with her inhalers.  She also continues to smoke.  She was continued on montelukast.  Her Advair was stopped and she was started on Breztri 2 puffs twice daily.  For her allergic rhinitis, she continued with her allergy shots every 4 weeks.  Started on cetirizine and Flonase because he was not taking any allergy medications.  For her reflux, she was continued on Dexilant. She was also started on Augmentin 875mg  BID.   Since the last visit, she has mostly done well. She does report that there is some yellow mucous that she has been coughing up for a period of a a few days. She has had no fever and denies chest pain. She has not been around anyone else who has been ill. She also denies testing herself for COVID19. She is adamantly demanding an antibiotic today to clear things up. Again, she last got antibiotics one month ago when she was here last time.   Asthma/Respiratory Symptom History: It is unclear whether she has been using her Breztri on a routine basis. She thinks that it has not  helped at all. She is not interested in trying any new nebulizer treatment regimens when she is sick. She is not interested in getting prednisone, low dose or otherwise.   Allergic Rhinitis Symptom History: She has been on her allergy shots. This seems to be working well. She is on her montelukast and her cetirizine. She does not use her nose spray at all.  GERD Symptom History: She remains on Dexilant daily. This seems to be working well for her. She has not been needing her Tums often. Symptoms have been well controlled.   Otherwise, there have been no changes to her past medical history, surgical history, family history, or social history.    Review of Systems  Constitutional: Negative.  Negative for chills, fever, malaise/fatigue and weight loss.  HENT:  Positive for congestion. Negative for ear discharge and ear pain.   Eyes:  Negative for pain, discharge and redness.  Respiratory:  Positive for cough. Negative for sputum production, shortness of breath and wheezing.   Cardiovascular: Negative.  Negative for chest pain and palpitations.  Gastrointestinal:  Negative for abdominal pain, constipation, diarrhea, heartburn, nausea and vomiting.  Skin: Negative.  Negative for itching and rash.  Neurological:  Negative for dizziness and headaches.  Endo/Heme/Allergies:  Negative for environmental allergies. Does not bruise/bleed easily.      Objective:   Blood pressure 120/72, pulse 82, temperature 98.2 F (36.8 C), resp. rate 18, height 5\' 2"  (1.575 m), weight 122 lb 6 oz (55.5 kg), SpO2 94 %. Body mass index is 22.38 kg/m.    Physical Exam Vitals reviewed.  Constitutional:      Appearance: Normal appearance. She is well-developed.  HENT:     Head: Normocephalic and atraumatic.     Right Ear: Tympanic membrane, ear canal and external ear normal.     Left Ear: Tympanic membrane, ear canal and external ear normal.     Nose: No nasal deformity, septal deviation, mucosal edema or  rhinorrhea.     Right Turbinates: Enlarged and swollen.     Left Turbinates: Enlarged and swollen.     Right Sinus: No maxillary sinus tenderness or frontal sinus tenderness.     Left Sinus: No maxillary sinus tenderness or frontal sinus tenderness.     Mouth/Throat:     Mouth: Mucous membranes are not pale and not dry.     Pharynx: Uvula midline.  Eyes:     General: Lids are normal. No allergic shiner.       Right eye: No discharge.        Left eye: No discharge.     Conjunctiva/sclera: Conjunctivae normal.     Right eye: Right conjunctiva is not injected. No chemosis.    Left eye: Left conjunctiva is not injected. No chemosis.    Pupils: Pupils are equal, round, and reactive to light.  Cardiovascular:     Rate and Rhythm: Normal rate and regular rhythm.     Heart sounds: Normal heart sounds.  Pulmonary:     Effort: Pulmonary effort is normal. No tachypnea, accessory muscle usage or respiratory distress.     Breath sounds: Examination of the right-lower field reveals decreased breath sounds. Examination of the left-lower field reveals decreased breath sounds. Decreased breath sounds present. No wheezing, rhonchi or rales.     Comments: Lungs actually sound pristine and better than I have heard them in a long time, maybe ever.  Chest:     Chest wall: No tenderness.  Lymphadenopathy:     Cervical: No cervical adenopathy.  Skin:    Coloration: Skin is not pale.     Findings: No abrasion, erythema, petechiae or rash. Rash is not papular, urticarial or vesicular.  Neurological:     Mental Status: She is alert.  Psychiatric:        Behavior: Behavior is cooperative.     Diagnostic studies: none        Salvatore Marvel, MD  [facility] [place] [facility] [place]

## 2021-11-14 NOTE — Telephone Encounter (Signed)
? ?  Name: Lori [name]  ?DOB: 22-Nov-1955  ?MRN: [id number]  ? ?Primary Cardiologist: Glenetta Hew, MD ? ?Chart reviewed as part of pre-operative protocol coverage. Patient was contacted 11/14/2021 in reference to pre-operative risk assessment for pending surgery as outlined below.  Lori [name] was last seen on 07/13/21 by Dr. Ellyn Hack.  Since that day, Lori [name] has done well. Getting > 4 mets of activity.  ? ?Therefore, based on ACC/AHA guidelines, the patient would be at acceptable risk for the planned procedure without further cardiovascular testing.  ? ?The patient was advised that if she develops new symptoms prior to surgery to contact our office to arrange for a follow-up visit, and she verbalized understanding. ? ?I will route this recommendation to the requesting party via Epic fax function and remove from pre-op pool. Please call with questions. ? ?Leanor Kail, PA ?11/14/2021, 9:19 AM  ?

## 2021-11-19 ENCOUNTER — Telehealth: Payer: Self-pay | Admitting: Internal Medicine

## 2021-11-19 MED ORDER — CYCLOBENZAPRINE HCL 5 MG PO TABS: 5.0000 mg | ORAL_TABLET | Freq: Three times a day (TID) | ORAL | 2 refills | Status: DC | PRN

## 2021-11-19 NOTE — Progress Notes (Signed)
Reviewed chart with Dr. Lanetta Inch and she would like respiratory/pulmonary clearance prior to surgery at [facility]. ?

## 2021-11-19 NOTE — Telephone Encounter (Signed)
1.Medication Requested: cyclobenzaprine (FLEXERIL) 5 MG tablet ? ?2. Pharmacy (Name, Street, [place]): [facility], [place]  ?Phone:  [phone] ?Fax:  [phone] ? ? ?3. On Med List: yes ? ?4. Last Visit with PCP: 12.21.22 ? ?5. Next visit date with PCP: n/a ? ? ?Agent: Please be advised that RX refills may take up to 3 business days. We ask that you follow-up with your pharmacy.  ?

## 2021-11-19 NOTE — Telephone Encounter (Signed)
Ok refill done 

## 2021-11-20 NOTE — Progress Notes (Signed)
I spoke with April at Dr Madelynn Done office and explained that Anesthesia is requesting a pulmonary clearance for patient to have surgery in an outpatient setting. She will relay message to Dr Tempie Donning. ?

## 2021-11-21 ENCOUNTER — Encounter (HOSPITAL_COMMUNITY): Payer: Self-pay

## 2021-11-21 ENCOUNTER — Other Ambulatory Visit: Payer: Self-pay | Admitting: Orthopedic Surgery

## 2021-11-21 NOTE — Progress Notes (Signed)
DUE TO COVID-19 ONLY ONE VISITOR IS ALLOWED TO COME WITH YOU AND STAY IN THE WAITING ROOM ONLY DURING PRE OP AND PROCEDURE DAY OF SURGERY.  ? ?PCP - Dr Cathlean Cower ?Cardiologist - Dr Glenetta Hew - cardiac clearance on chart dated 11/14/21 ? ?Chest x-ray - 09/05/21 (2V) ?EKG - 03/29/21 ?Stress Test - 09/28/14 ?ECHO - 03/28/21 ?Cardiac Cath - n/a ? ?ICD Pacemaker/Loop - n/a ? ?Sleep Study -  n/a ?CPAP - none ? ?ERAS: Clear liquids til 10:15 AM DOS ? ?Anesthesia review: Yes, However Dr Lanetta Inch stated that patient does not need pulmonary clearance for surgery at [facility]. ? ?STOP now taking any Aspirin (unless otherwise instructed by your surgeon), Aleve, Naproxen, Ibuprofen, Motrin, Advil, Goody's, BC's, all herbal medications, fish oil, and all vitamins.  ? ?Coronavirus Screening ?Covid test n/a Ambulatory Surgery  ?Do you have any of the following symptoms:  ?Cough yes/no: No ?Fever (>100.22F)  yes/no: No ?Runny nose yes/no: No ?Sore throat yes/no: No ?Difficulty breathing/shortness of breath  yes/no: No ? ?Have you traveled in the last 14 days and where? yes/no: No ? ?Patient verbalized understanding of instructions that were given to them at the PAT appointment. Patient was also instructed that they will need to review over the PAT instructions again at home before surgery. ? ?

## 2021-11-21 NOTE — Progress Notes (Deleted)
?Your procedure is scheduled on November 28, 2021 at 1:33PM. ? ?Report to [facility] Main Entrance "A" at 11:30 A.M., and check in at the Admitting office. ? ?Call this number if you have problems the morning of surgery: [phone] ? ?Call [phone] if you have any questions prior to your surgery date Monday-Friday 8am-4pm ? ? ?Remember: Do not eat or drink after midnight the night before your surgery ? ?Take these medicines the morning of surgery with A SIP OF WATER  ?DEXILANT  ? ?IF NEEDED: ?cetirizine (ZYRTEC)  ?cyclobenzaprine (FLEXERIL) ?EPI-pen ?gabapentin (NEURONTIN)  ?nitroGLYCERIN (NITROSTAT) ?traMADol Veatrice Bourbon)  ?Xanax ?albuterol (PROVENTIL) nebulizer ?budesonide (PULMICORT)  ?fluticasone (FLONASE)  ?levalbuterol [facility] HFA)  ? ?As of today, STOP taking any meloxicam (MOBIC), Aspirin (unless otherwise instructed by your surgeon), Aleve, Naproxen, Ibuprofen, Motrin, Advil, Goody's, BC's, all herbal medications, fish oil, and all vitamins. ? ?  ?The Morning of Surgery ? Do not wear jewelry, make-up or nail polish. ? Do not wear lotions, powders, or perfumes, or deodorant ? Do not shave 48 hours prior to surgery.   ? Do not bring valuables to the hospital. ? [place] is not responsible for any belongings or valuables. ? ?If you are a smoker, DO NOT Smoke 24 hours prior to surgery ? ?If you wear a CPAP at night please bring your mask the morning of surgery  ? ?Remember that you must have someone to transport you home after your surgery, and remain with you for 24 hours if you are discharged the same day. ? ? ?Please bring cases for contacts, glasses, hearing aids, dentures or bridgework because it cannot be worn into surgery.  ? ? ?Leave your suitcase in the car.  After surgery it may be brought to your room. ? ?For patients admitted to the hospital, discharge time will be determined by your treatment team. ? ?Patients discharged the day of surgery will not be allowed to drive home.  ? ? ?Special  instructions:   ?[place]- Preparing For Surgery ? ?Before surgery, you can play an important role. Because skin is not sterile, your skin needs to be as free of germs as possible. You can reduce the number of germs on your skin by washing with CHG (chlorahexidine gluconate) Soap before surgery.  CHG is an antiseptic cleaner which kills germs and bonds with the skin to continue killing germs even after washing.   ? ?Oral Hygiene is also important to reduce your risk of infection.  Remember - BRUSH YOUR TEETH THE MORNING OF SURGERY WITH YOUR REGULAR TOOTHPASTE ? ?Please do not use if you have an allergy to CHG or antibacterial soaps. If your skin becomes reddened/irritated stop using the CHG.  ?Do not shave (including legs and underarms) for at least 48 hours prior to first CHG shower. It is OK to shave your face. ? ?Please follow these instructions carefully. ?  ?Shower the NIGHT BEFORE SURGERY and the MORNING OF SURGERY with CHG Soap.  ? ?If you chose to wash your hair and body, wash as usual with your normal shampoo and body-wash/soap. ? ?Rinse your hair and body thoroughly to remove the shampoo and soap. ? ?Apply CHG directly to the skin (ONLY FROM THE NECK DOWN) and wash gently with a scrungie or a clean washcloth.  ? ?Do not use on open wounds or open sores. Avoid contact with your eyes, ears, mouth and genitals (private parts). Wash Face and genitals (private parts)  with your normal soap.  ? ?Wash  thoroughly, paying special attention to the area where your surgery will be performed. ? ?Thoroughly rinse your body with warm water from the neck down. ? ?DO NOT shower/wash with your normal soap after using and rinsing off the CHG Soap. ? ?Pat yourself dry with a CLEAN TOWEL. ? ?Wear CLEAN PAJAMAS to bed the night before surgery ? ?Place CLEAN SHEETS on your bed the night of your first shower and DO NOT SLEEP WITH PETS. ? ?Wear comfortable clothes the morning of surgery.  ? ? ? ?Day of Surgery: ? ?Please shower  the morning of surgery with the CHG soap ?Do not apply any deodorants/lotions. ?Please wear clean clothes to the hospital/surgery center.   ?Remember to brush your teeth WITH YOUR REGULAR TOOTHPASTE. ? ?NO VISITORS WILL BE ALLOWED IN PRE-OP WHERE PATIENTS ARE PREPPED FOR SURGERY.  ONLY 1 SUPPORT PERSON MAY BE PRESENT IN THE WAITING ROOM WHILE YOU ARE IN SURGERY.  IF YOU ARE TO BE ADMITTED, ONCE YOU ARE IN YOUR ROOM YOU WILL BE ALLOWED TWO (2) VISITORS. 1 (ONE) VISITOR MAY STAY OVERNIGHT BUT MUST ARRIVE TO THE ROOM BY 8pm.  Minor children may have two parents present. Special consideration for safety and communication needs will be reviewed on a case by case basis. ?  ? ? ?Please read over the following fact sheets that you were given. ? ?  ? ? ?   ? ?

## 2021-11-21 NOTE — Progress Notes (Deleted)
?Your procedure is scheduled on November 28, 2021 at 1:22 PM. ? ?Report to [facility] Main Entrance "A" at 11:30 A.M., and check in at the Admitting office. ? ?Call this number if you have problems the morning of surgery: [phone] ? ?Call [phone] if you have any questions prior to your surgery date Monday-Friday 8am-4pm ? ? ?Remember: Do not eat or drink after midnight the night before your surgery ? ?Take these medicines the morning of surgery with A SIP OF WATER  ?DEXILANT  ?Breztri Inhaler ?gabapentin (NEURONTIN)  ?Valtrex ? ?IF NEEDED: ?cyclobenzaprine (FLEXERIL) ?EPI-pen ?nitroGLYCERIN (NITROSTAT) ?traMADol Veatrice Bourbon)  ?ALPRAZolam Duanne Moron)  ?albuterol (PROVENTIL) nebulizer   ?fluticasone (FLONASE)  ?levalbuterol [facility] HFA)  ?Pulmicort Neb Tx ?Antivert ? ?As of today, STOP taking any meloxicam (MOBIC), Aspirin (unless otherwise instructed by your surgeon), Aleve, Naproxen, Ibuprofen, Motrin, Advil, Goody's, BC's, all herbal medications, fish oil, and all vitamins. ? ?  ?The Morning of Surgery ? Do not wear jewelry, make-up or nail polish. ? Do not wear lotions, powders, or perfumes, or deodorant ? Do not shave 48 hours prior to surgery.   ? Do not bring valuables to the hospital. ? [place] is not responsible for any belongings or valuables. ? ?If you are a smoker, DO NOT Smoke 24 hours prior to surgery ? ?If you wear a CPAP at night please bring your mask the morning of surgery  ? ?Remember that you must have someone to transport you home after your surgery, and remain with you for 24 hours if you are discharged the same day. ? ? ?Please bring cases for contacts, glasses, hearing aids, dentures or bridgework because it cannot be worn into surgery.  ? ?Patients discharged the day of surgery will not be allowed to drive home.  ? ? ?Special instructions:   ?[place]- Preparing For Surgery ? ?Before surgery, you can play an important role. Because skin is not sterile, your skin needs to be as free of  germs as possible. You can reduce the number of germs on your skin by washing with CHG (chlorahexidine gluconate) Soap before surgery.  CHG is an antiseptic cleaner which kills germs and bonds with the skin to continue killing germs even after washing.   ? ?Oral Hygiene is also important to reduce your risk of infection.  Remember - BRUSH YOUR TEETH THE MORNING OF SURGERY WITH YOUR REGULAR TOOTHPASTE ? ?Please do not use if you have an allergy to CHG or antibacterial soaps. If your skin becomes reddened/irritated stop using the CHG.  ?Do not shave (including legs and underarms) for at least 48 hours prior to first CHG shower. It is OK to shave your face. ? ?Please follow these instructions carefully. ?  ?Shower the NIGHT BEFORE SURGERY-Tues and the [place] with CHG Soap.  ? ?If you chose to wash your hair and body, wash as usual with your normal shampoo and body-wash/soap. ? ?Rinse your hair and body thoroughly to remove the shampoo and soap. ? ?Apply CHG directly to the skin (ONLY FROM THE NECK DOWN) and wash gently with a scrungie or a clean washcloth.  ? ?Do not use on open wounds or open sores. Avoid contact with your eyes, ears, mouth and genitals (private parts). Wash Face and genitals (private parts)  with your normal soap.  ? ?Wash thoroughly, paying special attention to the area where your surgery will be performed. ? ?Thoroughly rinse your body with warm water from the neck down. ? ?DO NOT  shower/wash with your normal soap after using and rinsing off the CHG Soap. ? ?Pat yourself dry with a CLEAN TOWEL. ? ?Wear CLEAN PAJAMAS to bed the night before surgery ? ?Place CLEAN SHEETS on your bed the night of your first shower and DO NOT SLEEP WITH PETS. ? ?Wear comfortable clothes the morning of surgery.  ? ? ? ?Day of Surgery: ? ?Please shower the morning of surgery with the CHG soap ?Do not apply any deodorants/lotions. ?Please wear clean clothes to the hospital/surgery center.   ?Remember to  brush your teeth WITH YOUR REGULAR TOOTHPASTE. ? ?NO VISITORS WILL BE ALLOWED IN PRE-OP WHERE PATIENTS ARE PREPPED FOR SURGERY.  ONLY 1 SUPPORT PERSON MAY BE PRESENT IN THE WAITING ROOM WHILE YOU ARE IN SURGERY.  IF YOU ARE TO BE ADMITTED, ONCE YOU ARE IN YOUR ROOM YOU WILL BE ALLOWED TWO (2) VISITORS. 1 (ONE) VISITOR MAY STAY OVERNIGHT BUT MUST ARRIVE TO THE ROOM BY 8pm.    ? ?Please read over the following fact sheets that you were given. ? ?  ? ? ?   ? ?

## 2021-11-21 NOTE — Progress Notes (Signed)
? ?Your procedure is scheduled on November 28, 2021 at 1:33PM. ? ?Report to [facility] Main Entrance "A" at 11:30 A.M., and check in at the Admitting office. ? ?Call this number if you have problems the morning of surgery: [phone] ? ?Call [phone] if you have any questions prior to your surgery date Monday-Friday 8am-4pm ? ? ?Remember: Do not eat or drink after midnight the night before your surgery ? ?Take these medicines the morning of surgery with A SIP OF WATER  ?DEXILANT  ? ?IF NEEDED: ?cetirizine (ZYRTEC)  ?cyclobenzaprine (FLEXERIL) ?EPI-pen ?gabapentin (NEURONTIN)  ?nitroGLYCERIN (NITROSTAT) ?traMADol Veatrice Bourbon)  ? ?albuterol (PROVENTIL) nebulizer -- as needed - Please bring all inhalers with you the day of surgery.  ?budesonide (PULMICORT)  ?fluticasone (FLONASE)  ?levalbuterol [facility] HFA)  ? ?As of today, STOP taking any meloxicam (MOBIC), Aspirin (unless otherwise instructed by your surgeon), Aleve, Naproxen, Ibuprofen, Motrin, Advil, Goody's, BC's, all herbal medications, fish oil, and all vitamins. ? ?  ?The Morning of Surgery ? Do not wear jewelry, make-up or nail polish. ? Do not wear lotions, powders, or perfumes, or deodorant ? Do not shave 48 hours prior to surgery.   ? Do not bring valuables to the hospital. ? [place] is not responsible for any belongings or valuables. ? ?If you are a smoker, DO NOT Smoke 24 hours prior to surgery ? ?If you wear a CPAP at night please bring your mask the morning of surgery  ? ?Remember that you must have someone to transport you home after your surgery, and remain with you for 24 hours if you are discharged the same day. ? ? ?Please bring cases for contacts, glasses, hearing aids, dentures or bridgework because it cannot be worn into surgery.  ? ? ?Leave your suitcase in the car.  After surgery it may be brought to your room. ? ?For patients admitted to the hospital, discharge time will be determined by your treatment team. ? ?Patients discharged the  day of surgery will not be allowed to drive home.  ? ? ?Special instructions:   ?[place]- Preparing For Surgery ? ?Before surgery, you can play an important role. Because skin is not sterile, your skin needs to be as free of germs as possible. You can reduce the number of germs on your skin by washing with CHG (chlorahexidine gluconate) Soap before surgery.  CHG is an antiseptic cleaner which kills germs and bonds with the skin to continue killing germs even after washing.   ? ?Oral Hygiene is also important to reduce your risk of infection.  Remember - BRUSH YOUR TEETH THE MORNING OF SURGERY WITH YOUR REGULAR TOOTHPASTE ? ?Please do not use if you have an allergy to CHG or antibacterial soaps. If your skin becomes reddened/irritated stop using the CHG.  ?Do not shave (including legs and underarms) for at least 48 hours prior to first CHG shower. It is OK to shave your face. ? ?Please follow these instructions carefully. ?  ?Shower the NIGHT BEFORE SURGERY and the MORNING OF SURGERY with CHG Soap.  ? ?If you chose to wash your hair and body, wash as usual with your normal shampoo and body-wash/soap. ? ?Rinse your hair and body thoroughly to remove the shampoo and soap. ? ?Apply CHG directly to the skin (ONLY FROM THE NECK DOWN) and wash gently with a scrungie or a clean washcloth.  ? ?Do not use on open wounds or open sores. Avoid contact with your eyes, ears, mouth and genitals (  private parts). Wash Face and genitals (private parts)  with your normal soap.  ? ?Wash thoroughly, paying special attention to the area where your surgery will be performed. ? ?Thoroughly rinse your body with warm water from the neck down. ? ?DO NOT shower/wash with your normal soap after using and rinsing off the CHG Soap. ? ?Pat yourself dry with a CLEAN TOWEL. ? ?Wear CLEAN PAJAMAS to bed the night before surgery ? ?Place CLEAN SHEETS on your bed the night of your first shower and DO NOT SLEEP WITH PETS. ? ?Wear comfortable clothes  the morning of surgery.  ? ? ? ?Day of Surgery: ? ?Please shower the morning of surgery with the CHG soap ?Do not apply any deodorants/lotions. ?Please wear clean clothes to the hospital/surgery center.   ?Remember to brush your teeth WITH YOUR REGULAR TOOTHPASTE. ? ?NO VISITORS WILL BE ALLOWED IN PRE-OP WHERE PATIENTS ARE PREPPED FOR SURGERY.  ONLY 1 SUPPORT PERSON MAY BE PRESENT IN THE WAITING ROOM WHILE YOU ARE IN SURGERY.  IF YOU ARE TO BE ADMITTED, ONCE YOU ARE IN YOUR ROOM YOU WILL BE ALLOWED TWO (2) VISITORS. 1 (ONE) VISITOR MAY STAY OVERNIGHT BUT MUST ARRIVE TO THE ROOM BY 8pm.  Minor children may have two parents present. Special consideration for safety and communication needs will be reviewed on a case by case basis. ?  ? ? ?Please read over the following fact sheets that you were given. ? ?  ? ? ?   ? ?

## 2021-11-22 ENCOUNTER — Other Ambulatory Visit: Payer: Self-pay

## 2021-11-22 ENCOUNTER — Encounter (HOSPITAL_COMMUNITY)
Admission: RE | Admit: 2021-11-22 | Discharge: 2021-11-22 | Disposition: A | Discharge status: Home / Self Care | Payer: Medicare Other | Source: Ambulatory Visit | Attending: Family Medicine | Admitting: Family Medicine

## 2021-11-22 ENCOUNTER — Ambulatory Visit (INDEPENDENT_AMBULATORY_CARE_PROVIDER_SITE_OTHER): Payer: Medicare Other

## 2021-11-22 ENCOUNTER — Encounter (HOSPITAL_COMMUNITY): Payer: Self-pay

## 2021-11-22 VITALS — BP 123/71 | HR 74 | Temp 98.4°F | Resp 18 | Ht 64.0 in | Wt 122.8 lb

## 2021-11-22 DIAGNOSIS — R079 Chest pain, unspecified: Secondary | ICD-10-CM | POA: Insufficient documentation

## 2021-11-22 DIAGNOSIS — Z01818 Encounter for other preprocedural examination: Secondary | ICD-10-CM

## 2021-11-22 DIAGNOSIS — Z79899 Other long term (current) drug therapy: Secondary | ICD-10-CM | POA: Insufficient documentation

## 2021-11-22 DIAGNOSIS — Z01812 Encounter for preprocedural laboratory examination: Secondary | ICD-10-CM | POA: Diagnosis not present

## 2021-11-22 DIAGNOSIS — J4540 Moderate persistent asthma, uncomplicated: Secondary | ICD-10-CM | POA: Insufficient documentation

## 2021-11-22 DIAGNOSIS — J449 Chronic obstructive pulmonary disease, unspecified: Secondary | ICD-10-CM | POA: Insufficient documentation

## 2021-11-22 DIAGNOSIS — J309 Allergic rhinitis, unspecified: Secondary | ICD-10-CM

## 2021-11-22 DIAGNOSIS — F17200 Nicotine dependence, unspecified, uncomplicated: Secondary | ICD-10-CM | POA: Diagnosis not present

## 2021-11-22 HISTORY — DX: Herpesviral infection, unspecified: B00.9

## 2021-11-22 LAB — CBC
HCT: 39.9 % (ref 36.0–46.0)
Hemoglobin: 13.9 g/dL (ref 12.0–15.0)
MCH: 32.5 pg (ref 26.0–34.0)
MCHC: 34.8 g/dL (ref 30.0–36.0)
MCV: 93.2 fL (ref 80.0–100.0)
Platelets: 296 10*3/uL (ref 150–400)
RBC: 4.28 MIL/uL (ref 3.87–5.11)
RDW: 12.8 % (ref 11.5–15.5)
WBC: 9.3 10*3/uL (ref 4.0–10.5)
nRBC: 0 % (ref 0.0–0.2)

## 2021-11-22 NOTE — Progress Notes (Signed)
?Your procedure is scheduled on November 28, 2021 at 1:33PM. ? ?Report to [facility] Main Entrance "A" at 11:30 A.M., and check in at the Admitting office. ? ?Call this number if you have problems the morning of surgery: [phone] ? ?Call [phone] if you have any questions prior to your surgery date Monday-Friday 8am-4pm ? ? ? ?Remember: Do not eat after midnight the night before your surgery. ? ?You may drink clear liquids until 10:15A.M. the morning of your surgery.   ?Clear liquids allowed are: Water, Non-Citrus Juices (without pulp), Carbonated Beverages, Clear Tea, Black Coffee ONLY (NO MILK, CREAM OR POWDERED CREAMER of any kind), and Gatorade ?  ? ?Patient Instructions ? ?The night before surgery:  ?No food after midnight. ONLY clear liquids after midnight ? ?The day of surgery (if you do NOT have diabetes):  ?Drink ONE (1) Pre-Surgery Clear Ensure by 10:15am the morning of surgery. Drink in one sitting. Do not sip.  ?This drink was given to you during your hospital  ?pre-op appointment visit. ? ?Nothing else to drink after completing the  ?Pre-Surgery Clear Ensure. ? ? ?       If you have questions, please contact your surgeon?s office.  ? ? ?Take these medicines the morning of surgery with A SIP OF WATER  ?DEXILANT  ? ?IF NEEDED: ?cetirizine (ZYRTEC)  ?cyclobenzaprine (FLEXERIL) ?EPI-pen ?gabapentin (NEURONTIN)  ?nitroGLYCERIN (NITROSTAT) ?traMADol Veatrice Bourbon)  ?Xanax ?albuterol (PROVENTIL) nebulizer ?budesonide (PULMICORT)  ?fluticasone (FLONASE)  ?levalbuterol [facility] HFA)  ? ?As of today, STOP taking any meloxicam (MOBIC), Aspirin (unless otherwise instructed by your surgeon), Aleve, Naproxen, Ibuprofen, Motrin, Advil, Goody's, BC's, all herbal medications, fish oil, and all vitamins. ? ?  ?The Morning of Surgery ? Do not wear jewelry, make-up or nail polish. ? Do not wear lotions, powders, or perfumes, or deodorant ? Do not shave 48 hours prior to surgery.   ? Do not bring valuables to the  hospital. ? [place] is not responsible for any belongings or valuables. ? ?If you are a smoker, DO NOT Smoke 24 hours prior to surgery ? ?If you wear a CPAP at night please bring your mask the morning of surgery  ? ?Remember that you must have someone to transport you home after your surgery, and remain with you for 24 hours if you are discharged the same day. ? ? ?Please bring cases for contacts, glasses, hearing aids, dentures or bridgework because it cannot be worn into surgery.  ? ? ?Leave your suitcase in the car.  After surgery it may be brought to your room. ? ?For patients admitted to the hospital, discharge time will be determined by your treatment team. ? ?Patients discharged the day of surgery will not be allowed to drive home.  ? ? ?Special instructions:   ?[place]- Preparing For Surgery ? ?Before surgery, you can play an important role. Because skin is not sterile, your skin needs to be as free of germs as possible. You can reduce the number of germs on your skin by washing with CHG (chlorahexidine gluconate) Soap before surgery.  CHG is an antiseptic cleaner which kills germs and bonds with the skin to continue killing germs even after washing.   ? ?Oral Hygiene is also important to reduce your risk of infection.  Remember - BRUSH YOUR TEETH THE MORNING OF SURGERY WITH YOUR REGULAR TOOTHPASTE ? ?Please do not use if you have an allergy to CHG or antibacterial soaps. If your skin becomes reddened/irritated stop using the  CHG.  ?Do not shave (including legs and underarms) for at least 48 hours prior to first CHG shower. It is OK to shave your face. ? ?Please follow these instructions carefully. ?  ?Shower the NIGHT BEFORE SURGERY and the MORNING OF SURGERY with CHG Soap.  ? ?If you chose to wash your hair and body, wash as usual with your normal shampoo and body-wash/soap. ? ?Rinse your hair and body thoroughly to remove the shampoo and soap. ? ?Apply CHG directly to the skin (ONLY FROM THE NECK  DOWN) and wash gently with a scrungie or a clean washcloth.  ? ?Do not use on open wounds or open sores. Avoid contact with your eyes, ears, mouth and genitals (private parts). Wash Face and genitals (private parts)  with your normal soap.  ? ?Wash thoroughly, paying special attention to the area where your surgery will be performed. ? ?Thoroughly rinse your body with warm water from the neck down. ? ?DO NOT shower/wash with your normal soap after using and rinsing off the CHG Soap. ? ?Pat yourself dry with a CLEAN TOWEL. ? ?Wear CLEAN PAJAMAS to bed the night before surgery ? ?Place CLEAN SHEETS on your bed the night of your first shower and DO NOT SLEEP WITH PETS. ? ?Wear comfortable clothes the morning of surgery.  ? ? ? ?Day of Surgery: ? ?Please shower the morning of surgery with the CHG soap ?Do not apply any deodorants/lotions. ?Please wear clean clothes to the hospital/surgery center.   ?Remember to brush your teeth WITH YOUR REGULAR TOOTHPASTE. ? ?NO VISITORS WILL BE ALLOWED IN PRE-OP WHERE PATIENTS ARE PREPPED FOR SURGERY.  ONLY 1 SUPPORT PERSON MAY BE PRESENT IN THE WAITING ROOM WHILE YOU ARE IN SURGERY.  IF YOU ARE TO BE ADMITTED, ONCE YOU ARE IN YOUR ROOM YOU WILL BE ALLOWED TWO (2) VISITORS. 1 (ONE) VISITOR MAY STAY OVERNIGHT BUT MUST ARRIVE TO THE ROOM BY 8pm.  Minor children may have two parents present. Special consideration for safety and communication needs will be reviewed on a case by case basis. ?  ? ? ?Please read over the following fact sheets that you were given. ? ?  ? ? ?   ? ?

## 2021-11-23 NOTE — Anesthesia Preprocedure Evaluation (Signed)
Anesthesia Evaluation    Airway        Dental   Pulmonary Current Smoker,           Cardiovascular      Neuro/Psych    GI/Hepatic   Endo/Other    Renal/GU      Musculoskeletal   Abdominal   Peds  Hematology   Anesthesia Other Findings   Reproductive/Obstetrics                             Anesthesia Physical Anesthesia Plan  ASA:   Anesthesia Plan:    Post-op Pain Management:    Induction:   PONV Risk Score and Plan:   Airway Management Planned:   Additional Equipment:   Intra-op Plan:   Post-operative Plan:   Informed Consent:   Plan Discussed with:   Anesthesia Plan Comments: (PAT note by Karoline Caldwell, PA-C: Follows with allergy/asthma for history of moderate persistent asthma with COPD overlap.  She continues to smoke.  She is maintained on [place] daily, Xopenex daily, Berna Bue every 8 weeks last seen by Dr. Ernst Bowler 12/03/2021.  At that time she was noted to be doing fairly well, however compliance with inhaled medications has been an issue.  She did report a couple days of cough productive of yellow sputum.  However, Dr. Ernst Bowler noted that her lungs sounded, "pristine".  She was recommended to use DuoNebs and Mucinex for 1 to 2 weeks.  No antibiotic felt necessary.  Per PAT RN, patient denied cough or worsened shortness of breath at preop visit.  Follows with cardiology for history of recurrent episodes of chest pain with multiple negative cardiac evaluations.  (Negative Myoview stress test, normal cardiac CTA).  Last seen by Dr. Ellyn Hack on 07/13/2021.  She was noted to still have intermittent chest pain symptoms that come and go, no real change in symptoms.  She was referred to [facility] clinic for additional aggressive management.  CT cardiac scoring was also ordered for updated baseline CVD risk assessment, has not yet been completed.  Cardiac clearance for surgery per telephone  encounter 11/14/2021, "Chart reviewed as part of pre-operative protocol coverage. Patient was contacted3/1/2023in reference to pre-operative risk assessment for pending surgery as outlined below. Shanee Batch Dockerywas last seen on 10/28/22by Dr. Ellyn Hack. Since that day, SHERISSE FULLILOVE done well.Getting > 4 mets of activity.Therefore, based on ACC/AHA guidelines, the patient would be at acceptable risk for the planned procedure without further cardiovascular testing."  Preop CBC reviewed, WNL.  EKG 03/29/2021: Sinus rhythm.  Rate 85.  Borderline short PR interval.  Low voltage, precordial leads.  Abnormal R wave progression, early transition.  TTE 03/28/2021: 1. Left ventricular ejection fraction, by estimation, is 60 to 65%. The  left ventricle has normal function. The left ventricle has no regional  wall motion abnormalities. Left ventricular diastolic parameters are  consistent with Grade I diastolic  dysfunction (impaired relaxation).  2. Right ventricular systolic function is normal. The right ventricular  size is normal. Tricuspid regurgitation signal is inadequate for assessing  PA pressure.  3. The mitral valve is normal in structure. No evidence of mitral valve  regurgitation. No evidence of mitral stenosis.  4. The aortic valve is tricuspid. Aortic valve regurgitation is not  visualized. No aortic stenosis is present.  5. The inferior vena cava is normal in size with greater than 50%  respiratory variability, suggesting right atrial pressure of 3 mmHg.   Coronary CTA 12/02/2016:  IMPRESSION: 1. Coronary calcium score of 0. This was 0 percentile for age and sex matched control.  2. Normal coronary origin. Right dominance.  3. No evidence of CAD. Consider non-cardiac origin of chest pain.  Nuclear stress 09/28/2014: Impression Exercise Capacity: Lexiscan with no exercise. BP Response: Normal blood pressure response. Clinical Symptoms: No significant symptoms  noted. ECG Impression: No significant ECG changes with Lexiscan. Comparison with Prior Nuclear Study: No previous nuclear study performed  Overall Impression: Normal stress nuclear study.TID is abnormal at 1.37.  LV Wall Motion: NL LV Function; NL Wall Motion; LVEF 66%.  )        Anesthesia Quick Evaluation

## 2021-11-23 NOTE — Progress Notes (Signed)
Anesthesia Chart Review: ? ?Follows with allergy/asthma for history of moderate persistent asthma with COPD overlap.  She continues to smoke.  She is maintained on [place] daily, Xopenex daily, Berna Bue every 8 weeks last seen by Dr. Ernst Bowler 12/03/2021.  At that time she was noted to be doing fairly well, however compliance with inhaled medications has been an issue.  She did report a couple days of cough productive of yellow sputum.  However, Dr. Ernst Bowler noted that her lungs sounded, "pristine".  She was recommended to use DuoNebs and Mucinex for 1 to 2 weeks.  No antibiotic felt necessary.  Per PAT RN, patient denied cough or worsened shortness of breath at preop visit. ? ?Follows with cardiology for history of recurrent episodes of chest pain with multiple negative cardiac evaluations.  (Negative Myoview stress test, normal cardiac CTA).  Last seen by Dr. Ellyn Hack on 07/13/2021.  She was noted to still have intermittent chest pain symptoms that come and go, no real change in symptoms.  She was referred to [facility] clinic for additional aggressive management.  CT cardiac scoring was also ordered for updated baseline CVD risk assessment, has not yet been completed.  Cardiac clearance for surgery per telephone encounter 11/14/2021, "Chart reviewed as part of pre-operative protocol coverage. Patient was contacted 11/14/2021 in reference to pre-operative risk assessment for pending surgery as outlined below.  MIRJANA TARLETON was last seen on 07/13/21 by Dr. Ellyn Hack.  Since that day, KORTNEE BAS has done well. Getting > 4 mets of activity. Therefore, based on ACC/AHA guidelines, the patient would be at acceptable risk for the planned procedure without further cardiovascular testing." ? ?Preop CBC reviewed, WNL. ? ?EKG 03/29/2021: Sinus rhythm.  Rate 85.  Borderline short PR interval.  Low voltage, precordial leads.  Abnormal R wave progression, early transition. ? ?TTE 03/28/2021: ? 1. Left ventricular ejection fraction, by  estimation, is 60 to 65%. The  ?left ventricle has normal function. The left ventricle has no regional  ?wall motion abnormalities. Left ventricular diastolic parameters are  ?consistent with Grade I diastolic  ?dysfunction (impaired relaxation).  ? 2. Right ventricular systolic function is normal. The right ventricular  ?size is normal. Tricuspid regurgitation signal is inadequate for assessing  ?PA pressure.  ? 3. The mitral valve is normal in structure. No evidence of mitral valve  ?regurgitation. No evidence of mitral stenosis.  ? 4. The aortic valve is tricuspid. Aortic valve regurgitation is not  ?visualized. No aortic stenosis is present.  ? 5. The inferior vena cava is normal in size with greater than 50%  ?respiratory variability, suggesting right atrial pressure of 3 mmHg.  ? ?Coronary CTA 12/02/2016: ?IMPRESSION: ?1. Coronary calcium score of 0. This was 0 percentile for age and ?sex matched control. ?  ?2. Normal coronary origin.  Right dominance. ?  ?3. No evidence of CAD.  Consider non-cardiac origin of chest pain. ? ?Nuclear stress 09/28/2014: ?Impression ?Exercise Capacity:  Lexiscan with no exercise. ?BP Response:  Normal blood pressure response. ?Clinical Symptoms:  No significant symptoms noted. ?ECG Impression:  No significant ECG changes with Lexiscan. ?Comparison with Prior Nuclear Study: No previous nuclear study performed ?  ?Overall Impression:  Normal stress nuclear study. TID is abnormal at 1.37. ?  ?LV Wall Motion:  NL LV Function; NL Wall Motion; LVEF 66%. ? ? ? ?Karoline Caldwell, PA-C ?[facility]/Anesthesiology ?Phone [phone] ?11/23/2021 4:45 PM ? ?

## 2021-11-24 ENCOUNTER — Other Ambulatory Visit: Payer: Self-pay | Admitting: Internal Medicine

## 2021-11-28 ENCOUNTER — Ambulatory Visit (HOSPITAL_BASED_OUTPATIENT_CLINIC_OR_DEPARTMENT_OTHER): Payer: Medicare Other | Admitting: Anesthesiology

## 2021-11-28 ENCOUNTER — Ambulatory Visit (HOSPITAL_COMMUNITY)
Admission: RE | Admit: 2021-11-28 | Discharge: 2021-11-28 | Disposition: A | Discharge status: Home / Self Care | Payer: Medicare Other | Attending: Orthopedic Surgery | Admitting: Orthopedic Surgery

## 2021-11-28 ENCOUNTER — Ambulatory Visit (HOSPITAL_COMMUNITY): Payer: Medicare Other | Admitting: Anesthesiology

## 2021-11-28 ENCOUNTER — Other Ambulatory Visit: Payer: Self-pay

## 2021-11-28 ENCOUNTER — Encounter (HOSPITAL_COMMUNITY)
Admission: RE | Disposition: A | Discharge status: Home / Self Care | Payer: Self-pay | Source: Home / Self Care | Attending: Orthopedic Surgery

## 2021-11-28 ENCOUNTER — Ambulatory Visit (HOSPITAL_COMMUNITY): Payer: Medicare Other

## 2021-11-28 ENCOUNTER — Encounter (HOSPITAL_COMMUNITY): Payer: Self-pay | Admitting: Orthopedic Surgery

## 2021-11-28 DIAGNOSIS — F419 Anxiety disorder, unspecified: Secondary | ICD-10-CM | POA: Insufficient documentation

## 2021-11-28 DIAGNOSIS — J449 Chronic obstructive pulmonary disease, unspecified: Secondary | ICD-10-CM

## 2021-11-28 DIAGNOSIS — M797 Fibromyalgia: Secondary | ICD-10-CM | POA: Diagnosis not present

## 2021-11-28 DIAGNOSIS — N289 Disorder of kidney and ureter, unspecified: Secondary | ICD-10-CM | POA: Diagnosis not present

## 2021-11-28 DIAGNOSIS — K449 Diaphragmatic hernia without obstruction or gangrene: Secondary | ICD-10-CM | POA: Diagnosis not present

## 2021-11-28 DIAGNOSIS — F32A Depression, unspecified: Secondary | ICD-10-CM | POA: Diagnosis not present

## 2021-11-28 DIAGNOSIS — M1811 Unilateral primary osteoarthritis of first carpometacarpal joint, right hand: Secondary | ICD-10-CM

## 2021-11-28 DIAGNOSIS — Z79899 Other long term (current) drug therapy: Secondary | ICD-10-CM | POA: Insufficient documentation

## 2021-11-28 DIAGNOSIS — K219 Gastro-esophageal reflux disease without esophagitis: Secondary | ICD-10-CM | POA: Insufficient documentation

## 2021-11-28 DIAGNOSIS — M189 Osteoarthritis of first carpometacarpal joint, unspecified: Secondary | ICD-10-CM | POA: Diagnosis not present

## 2021-11-28 HISTORY — PX: LIGAMENT REPAIR: SHX5444

## 2021-11-28 SURGERY — REPAIR, LIGAMENT
Anesthesia: Monitor Anesthesia Care | Laterality: Right

## 2021-11-28 MED ORDER — ACETAMINOPHEN 10 MG/ML IV SOLN: 1000.0000 mg | Freq: Once | INTRAVENOUS | Status: DC | PRN

## 2021-11-28 MED ORDER — ACETAMINOPHEN 500 MG PO TABS: 1000.0000 mg | ORAL_TABLET | Freq: Once | ORAL | Status: DC | PRN

## 2021-11-28 MED ORDER — LIDOCAINE 2% (20 MG/ML) 5 ML SYRINGE
INTRAMUSCULAR | Status: AC
  Filled 2021-11-28: qty 5

## 2021-11-28 MED ORDER — BUPIVACAINE-EPINEPHRINE (PF) 0.5% -1:200000 IJ SOLN
INTRAMUSCULAR | Status: DC | PRN
  Administered 2021-11-28: 20 mL via PERINEURAL

## 2021-11-28 MED ORDER — CHLORHEXIDINE GLUCONATE 0.12 % MT SOLN
15.0000 mL | Freq: Once | OROMUCOSAL | Status: AC
  Administered 2021-11-28: 15 mL via OROMUCOSAL
  Filled 2021-11-28: qty 15

## 2021-11-28 MED ORDER — FENTANYL CITRATE (PF) 100 MCG/2ML IJ SOLN: 25.0000 ug | INTRAMUSCULAR | Status: DC | PRN

## 2021-11-28 MED ORDER — MIDAZOLAM HCL 2 MG/2ML IJ SOLN
INTRAMUSCULAR | Status: AC
  Administered 2021-11-28: 1 mg via INTRAVENOUS
  Filled 2021-11-28: qty 2

## 2021-11-28 MED ORDER — CEFAZOLIN SODIUM-DEXTROSE 2-4 GM/100ML-% IV SOLN
2.0000 g | INTRAVENOUS | Status: AC
  Administered 2021-11-28: 2 g via INTRAVENOUS
  Filled 2021-11-28: qty 100

## 2021-11-28 MED ORDER — FENTANYL CITRATE (PF) 100 MCG/2ML IJ SOLN: 50.0000 ug | Freq: Once | INTRAMUSCULAR | Status: AC

## 2021-11-28 MED ORDER — LIDOCAINE-EPINEPHRINE 2 %-1:100000 IJ SOLN
INTRAMUSCULAR | Status: DC | PRN
  Administered 2021-11-28: 5 mL via PERINEURAL

## 2021-11-28 MED ORDER — ACETAMINOPHEN 160 MG/5ML PO SOLN: 1000.0000 mg | Freq: Once | ORAL | Status: DC | PRN

## 2021-11-28 MED ORDER — 0.9 % SODIUM CHLORIDE (POUR BTL) OPTIME
TOPICAL | Status: DC | PRN
  Administered 2021-11-28: 1000 mL

## 2021-11-28 MED ORDER — LACTATED RINGERS IV SOLN: INTRAVENOUS | Status: DC

## 2021-11-28 MED ORDER — ORAL CARE MOUTH RINSE: 15.0000 mL | Freq: Once | OROMUCOSAL | Status: AC

## 2021-11-28 MED ORDER — PROPOFOL 1000 MG/100ML IV EMUL
INTRAVENOUS | Status: AC
  Filled 2021-11-28: qty 100

## 2021-11-28 MED ORDER — FENTANYL CITRATE (PF) 100 MCG/2ML IJ SOLN
INTRAMUSCULAR | Status: AC
  Administered 2021-11-28: 50 ug via INTRAVENOUS
  Filled 2021-11-28: qty 2

## 2021-11-28 MED ORDER — PHENYLEPHRINE 40 MCG/ML (10ML) SYRINGE FOR IV PUSH (FOR BLOOD PRESSURE SUPPORT)
PREFILLED_SYRINGE | INTRAVENOUS | Status: AC
  Filled 2021-11-28: qty 10

## 2021-11-28 MED ORDER — LIDOCAINE 2% (20 MG/ML) 5 ML SYRINGE
INTRAMUSCULAR | Status: DC | PRN
  Administered 2021-11-28: 40 mg via INTRAVENOUS

## 2021-11-28 MED ORDER — BUPIVACAINE HCL (PF) 0.25 % IJ SOLN
INTRAMUSCULAR | Status: AC
  Filled 2021-11-28: qty 30

## 2021-11-28 MED ORDER — MIDAZOLAM HCL 2 MG/2ML IJ SOLN: 1.0000 mg | Freq: Once | INTRAMUSCULAR | Status: AC

## 2021-11-28 MED ORDER — CEFAZOLIN SODIUM-DEXTROSE 2-4 GM/100ML-% IV SOLN: 2.0000 g | INTRAVENOUS | Status: DC

## 2021-11-28 MED ORDER — OXYCODONE HCL 5 MG PO TABS: 5.0000 mg | ORAL_TABLET | ORAL | 0 refills | Status: AC | PRN

## 2021-11-28 MED ORDER — PHENYLEPHRINE 40 MCG/ML (10ML) SYRINGE FOR IV PUSH (FOR BLOOD PRESSURE SUPPORT)
PREFILLED_SYRINGE | INTRAVENOUS | Status: DC | PRN
  Administered 2021-11-28: 40 ug via INTRAVENOUS
  Administered 2021-11-28: 80 ug via INTRAVENOUS
  Administered 2021-11-28 (×2): 40 ug via INTRAVENOUS

## 2021-11-28 MED ORDER — PROPOFOL 500 MG/50ML IV EMUL
INTRAVENOUS | Status: DC | PRN
  Administered 2021-11-28: 100 ug/kg/min via INTRAVENOUS

## 2021-11-28 SURGICAL SUPPLY — 43 items
BAG COUNTER SPONGE SURGICOUNT (BAG) ×2 IMPLANT
BAG SPNG CNTER NS LX DISP (BAG) ×1
BLADE 15 SAFETY STRL DISP (BLADE) ×1 IMPLANT
BNDG CMPR 9X4 STRL LF SNTH (GAUZE/BANDAGES/DRESSINGS) ×1
BNDG COHESIVE 1X5 TAN STRL LF (GAUZE/BANDAGES/DRESSINGS) IMPLANT
BNDG ELASTIC 3X5.8 VLCR STR LF (GAUZE/BANDAGES/DRESSINGS) ×1 IMPLANT
BNDG ELASTIC 4X5.8 VLCR STR LF (GAUZE/BANDAGES/DRESSINGS) ×1 IMPLANT
BNDG ESMARK 4X9 LF (GAUZE/BANDAGES/DRESSINGS) ×1 IMPLANT
BNDG GAUZE ELAST 4 BULKY (GAUZE/BANDAGES/DRESSINGS) ×3 IMPLANT
CORD BIPOLAR FORCEPS 12FT (ELECTRODE) ×2 IMPLANT
COVER SURGICAL LIGHT HANDLE (MISCELLANEOUS) ×2 IMPLANT
CUFF TOURN SGL QUICK 18X4 (TOURNIQUET CUFF) ×2 IMPLANT
CUFF TOURN SGL QUICK 24 (TOURNIQUET CUFF)
CUFF TRNQT CYL 24X4X16.5-23 (TOURNIQUET CUFF) IMPLANT
DRAPE C-ARM MINI 42X72 WSTRAPS (DRAPES) ×1 IMPLANT
DRAPE SURG 17X23 STRL (DRAPES) ×2 IMPLANT
DRSG XEROFORM 1X8 (GAUZE/BANDAGES/DRESSINGS) ×1 IMPLANT
GAUZE SPONGE 2X2 8PLY STRL LF (GAUZE/BANDAGES/DRESSINGS) IMPLANT
GAUZE SPONGE 4X4 12PLY STRL (GAUZE/BANDAGES/DRESSINGS) IMPLANT
GAUZE XEROFORM 1X8 LF (GAUZE/BANDAGES/DRESSINGS) IMPLANT
GOWN STRL REUS W/ TWL LRG LVL3 (GOWN DISPOSABLE) ×2 IMPLANT
GOWN STRL REUS W/ TWL XL LVL3 (GOWN DISPOSABLE) ×3 IMPLANT
GOWN STRL REUS W/TWL LRG LVL3 (GOWN DISPOSABLE) ×2
GOWN STRL REUS W/TWL XL LVL3 (GOWN DISPOSABLE) ×2
KIT BASIN OR (CUSTOM PROCEDURE TRAY) ×2 IMPLANT
KIT TURNOVER KIT B (KITS) ×2 IMPLANT
NS IRRIG 1000ML POUR BTL (IV SOLUTION) ×2 IMPLANT
PACK ORTHO EXTREMITY (CUSTOM PROCEDURE TRAY) ×2 IMPLANT
PAD ARMBOARD 7.5X6 YLW CONV (MISCELLANEOUS) ×4 IMPLANT
PAD CAST 4YDX4 CTTN HI CHSV (CAST SUPPLIES) IMPLANT
PADDING CAST ABS 4INX4YD NS (CAST SUPPLIES) ×1
PADDING CAST ABS COTTON 4X4 ST (CAST SUPPLIES) IMPLANT
PADDING CAST COTTON 4X4 STRL (CAST SUPPLIES)
SPECIMEN JAR SMALL (MISCELLANEOUS) ×2 IMPLANT
SPONGE GAUZE 2X2 STER 10/PKG (GAUZE/BANDAGES/DRESSINGS)
SUCTION FRAZIER HANDLE 10FR (MISCELLANEOUS) ×2
SUCTION TUBE FRAZIER 10FR DISP (MISCELLANEOUS) IMPLANT
SUT ETHIBOND 3 0 SH 1 (SUTURE) ×1 IMPLANT
SUT MERSILENE 4 0 P 3 (SUTURE) ×1 IMPLANT
TOWEL GREEN STERILE (TOWEL DISPOSABLE) ×2 IMPLANT
TOWEL GREEN STERILE FF (TOWEL DISPOSABLE) ×2 IMPLANT
UNDERPAD 30X36 HEAVY ABSORB (UNDERPADS AND DIAPERS) ×2 IMPLANT
WATER STERILE IRR 1000ML POUR (IV SOLUTION) ×2 IMPLANT

## 2021-11-28 NOTE — Brief Op Note (Signed)
11/28/2021 ? ?3:10 PM ? ?PATIENT:  Lori [name]  66 y.o. female ? ?PRE-OPERATIVE DIAGNOSIS:  RIGHT THUMB CARPOMETACARPAL ARTHRITIS ? ?POST-OPERATIVE DIAGNOSIS:  RIGHT THUMB CARPOMETACARPAL  ? ?PROCEDURE:  Procedure(s): ?RIGHT THUMB LIGAMENT RECONSTRUCTION AND TENDON INTERPOSITION (Right) ? ?SURGEON:  Surgeon(s) and Role: ?   * Sherilyn Cooter, MD - Primary ? ?PHYSICIAN ASSISTANT:  ? ?ASSISTANTS: none  ? ?ANESTHESIA:   regional and MAC ? ?EBL:  15  ? ?BLOOD ADMINISTERED:none ? ?DRAINS: none  ? ?LOCAL MEDICATIONS USED:  NONE ? ?SPECIMEN:  No Specimen ? ?DISPOSITION OF SPECIMEN:  N/A ? ?COUNTS:  YES ? ?TOURNIQUET:   ?Total Tourniquet Time Documented: ?Upper Arm (Right) - 61 minutes ?Total: Upper Arm (Right) - 61 minutes ? ? ?DICTATION: .Dragon Dictation ? ?PLAN OF CARE: Discharge to home after PACU ? ?PATIENT DISPOSITION:  PACU - hemodynamically stable. ?  ?Delay start of Pharmacological VTE agent (>24hrs) due to surgical blood loss or risk of bleeding: not applicable ? ?

## 2021-11-28 NOTE — Transfer of Care (Signed)
Immediate Anesthesia Transfer of Care Note ? ?Patient: Lori [name] ? ?Procedure(s) Performed: RIGHT THUMB LIGAMENT RECONSTRUCTION AND TENDON INTERPOSITION (Right) ? ?Patient Location: PACU ? ?Anesthesia Type:MAC combined with regional for post-op pain ? ?Level of Consciousness: awake, alert  and oriented ? ?Airway & Oxygen Therapy: Patient Spontanous Breathing and Patient connected to nasal cannula oxygen ? ?Post-op Assessment: Report given to RN and Post -op Vital signs reviewed and stable ? ?Post vital signs: Reviewed and stable ? ?Last Vitals:  ?Vitals Value Taken Time  ?BP 115/62 11/28/21 1522  ?Temp    ?Pulse 72 11/28/21 1522  ?Resp 15 11/28/21 1522  ?SpO2 97 % 11/28/21 1522  ?Vitals shown include unvalidated device data. ? ?Last Pain:  ?Vitals:  ? 11/28/21 1245  ?PainSc: 0-No pain  ?   ? ?Patients Stated Pain Goal: 0 (11/28/21 1112) ? ?Complications: No notable events documented. ?

## 2021-11-28 NOTE — Anesthesia Procedure Notes (Signed)
Anesthesia Regional Block: Supraclavicular block  ? ?Pre-Anesthetic Checklist: , timeout performed,  Correct Patient, Correct Site, Correct Laterality,  Correct Procedure, Correct Position, site marked,  Risks and benefits discussed,  Surgical consent,  Pre-op evaluation,  At surgeon's request and post-op pain management ? ?Laterality: Right and Lower ? ?Prep: chloraprep     ?  ?Needles:  ?Injection technique: Single-shot ? ?  ? ? ?Needle Length: 5cm  ?Needle Gauge: 22  ? ? ? ?Additional Needles: ?Arrow? StimuQuik? ECHO Echogenic Stimulating PNB Needle ? ?Procedures:,,,, ultrasound used (permanent image in chart),,    ?Narrative:  ?Start time: 11/28/2021 12:32 PM ?End time: 11/28/2021 12:37 PM ?Injection made incrementally with aspirations every 5 mL. ? ?Performed by: Personally  ?Anesthesiologist: Oleta Mouse, MD ? ? ? ? ?

## 2021-11-28 NOTE — Op Note (Signed)
? ?Date of Surgery: 11/28/2021 ? ?INDICATIONS: Lori [name] is a 66 y.o.-year-old female with right thumb carpometacarpal osteoarthritis that has failed conservative management.  Risks, benefits, and alternatives to surgery were again discussed with the patient wishing to proceed with surgery.  Informed consent was signed after our discussion.  ? ?PREOPERATIVE DIAGNOSIS:  ?Right thumb CMC osteoarthritis ? ?POSTOPERATIVE DIAGNOSIS: Same. ? ?PROCEDURE:  ?Right trapeziectomy ?Right FCR transfer for ligament reconstruction and tendon interposition ? ? ?SURGEON: Audria Nine, M.D. ? ?ASSIST:  ? ?ANESTHESIA:  Regional, MAC ? ?IV FLUIDS AND URINE: See anesthesia. ? ?ESTIMATED BLOOD LOSS: 15 mL. ? ?IMPLANTS: * No implants in log *  ? ?DRAINS: None ? ?COMPLICATIONS: None ? ?DESCRIPTION OF PROCEDURE: The patient was met in the preoperative holding area where the surgical site was marked and the consent form was verified.  The patient was then taken to the operating room and transferred to the operating table.  All bony prominences were well padded.  A tourniquet was applied to the right upper arm.  Monitored sedation was induced.  The operative extremity was prepped and draped in the usual and sterile fashion.  A formal time-out was performed to confirm that this was the correct patient, surgery, side, and site.  ? ?Following timeout, the limb was exsanguinated with an Esmarch bandage and the tourniquet inflated 250 mmHg.  A longitudinal incision was made from the base of the metacarpal to the level of the trapezial joint.  The skin and subcutaneous tissue was divided.  Blunt dissection was used to identify the branches of the superficial radial nerve.  These were protected throughout the case.  The APL and EPB tendons were identified.  The interval between these tendons was divided to the level of the first dorsal compartment.  Blunt dissection was used to identify the superficial branch of the radial artery.  Small  branches were coagulated the bipolar to allow for safe retraction of the vascular bundle.  The bundle was retracted with a self-retaining retractor.  The capsule of the [facility] joint was sharply incised longitudinally.  There was evidence of synovitis.  The capsule was sharply excised off of the trapezium along with its other ligamentous attachments.  This was done near circumferentially.  With the trapezium nearly completely freed up it was removed piecemeal with a rongeur so as to not injure the underlying FCR tendon.  The trapezium was excised in its entirety.  The underlying FCR was intact and in good condition.  The articulation between the trapezoid and scaphoid was examined under direct utilization.  There was well-maintained cartilage without evidence of significant degenerative changes.  A 4.5 mm drill hole was then made in the base of the trapezium starting approximately 1 cm from the base perpendicular to the nail plate.  The drill was aimed at the volar aspect of the articular surface in the area of the volar oblique ligament insertion.  Wound was thoroughly irrigated to remove any bony debris. ? ?I then turned my attention towards the harvest of the FCR tendon.  A first incision was made approximately 10 cm proximal to the wrist flexion crease.  The skin was incised and dissection used to identify the FCR tendon.  A hemostat was placed underneath the tendon.  Action on the hemostat produced flexion and radial deviation of the wrist signifying that this was the FCR tendon.  A second counterincision was made at the wrist flexion crease.  Dissection was again used to identify the FCR tendon hemostat placed  underneath the tendon.  Traction on both hemostats from that this was the intact FCR.  The FCR was divided proximally.  There was then pulled into the trapeziectomy space.  Traction was placed on the FCR tendon and it was freed up all the way to its insertion on the metacarpal.  A Carroll tendon passer was  then used to bring the FCR tendon out of the previously made drill hole.  The tendon was then looped under itself and the thumb pulled with traction and radial abduction.  2-0 Ethibond sutures were used to suture the tendon to itself.  A double-ended 3-0 Ethibond was then tied at the capsule.  The end of the FCR tendon was then sutured into the trapeziectomy space to act as our interposition material. ? ?With completion of our suspension plasty the wound was thoroughly irrigated with copious sterile saline.  The capsule was then closed using the remainder of the 3-0 Ethibond suture in a.  Interrupted fashion.  The tourniquet was let down and hemostasis was achieved with direct pressure and bipolar cautery.  There was no bleeding from the superficial radial artery and accompanying veins.  The wound was then closed in a layered fashion using 4-0 Monocryl suture followed by 4-0 horizontal mattress nylon suture.  The FCR harvest sutures were then closed with 4 Monocryl and 4-0 nylon's. ? ?The wound was then dressed with Xeroform, folded Kerlix, cast padding, and a well padded thumb spica splint. ? ?She was then reversed from anesthesia and transferred the postoperative bed.  All counts were correct times within the procedure.  She was taken to PACU in stable condition. ? ?POSTOPERATIVE PLAN: Will be discharged home with appropriate pain medication and discharge instructions.  I will see her back in the office in 10 to 14 days for her postoperative visit.  We will start hand therapy at that time. ? ?Audria Nine, MD ?3:14 PM  ?

## 2021-11-28 NOTE — Discharge Instructions (Signed)
? ? ?Audria Nine, M.D. ?Hand Surgery ? ?POST-OPERATIVE DISCHARGE INSTRUCTIONS ? ? ?PRESCRIPTIONS: ?You have been given a prescription to be taken as directed for post-operative pain control.  You may also take over the counter ibuprofen/aleve and tylenol for pain. Take this as directed on the packaging. Do not exceed 3000 mg tylenol/acetaminophen in 24 hours. ? ?Ibuprofen 600-800 mg (3-4) tablets by mouth every 6 hours as needed for pain.  ?OR ?Aleve 2 tablets by mouth every 12 hours (twice daily) as needed for pain.  ?AND/OR ?Tylenol 1000 mg (2 tablets) every 8 hours as needed for pain. ? ?Please use your pain medication carefully, as refills are limited and you may not be provided with one.  As stated above, please use over the counter pain medicine - it will also be helpful with decreasing your swelling.  ? ? ?ANESTHESIA: ?After your surgery, post-surgical discomfort or pain is likely. This discomfort can last several days to a few weeks. At certain times of the day your discomfort may be more intense.  ? ?Did you receive a nerve block?  ?A nerve block can provide pain relief for one hour to two days after your surgery. As long as the nerve block is working, you will experience little or no sensation in the area the surgeon operated on.  ?As the nerve block wears off, you will begin to experience pain or discomfort. It is very important that you begin taking your prescribed pain medication before the nerve block fully wears off. Treating your pain at the first sign of the block wearing off will ensure your pain is better controlled and more tolerable when full-sensation returns. Do not wait until the pain is intolerable, as the medicine will be less effective. It is better to treat pain in advance than to try and catch up.  ? ?General Anesthesia:  ?If you did not receive a nerve block during your surgery, you will need to start taking your pain medication shortly after your surgery and should continue to do  so as prescribed by your surgeon.   ? ? ?ICE AND ELEVATION: ?You may use ice for the first 48-72 hours, but it is not critical.   ?Elevation, as much as possible for the next 48 hours, is critical for decreasing swelling as well as for pain relief. Elevation means when you are seated or lying down, you hand should be at or above your heart. When walking, the hand needs to be at or above the level of your elbow.  ?If the bandage gets too tight, it may need to be loosened. Please contact our office and we will instruct you in how to do this.  ? ? ?SURGICAL BANDAGES:  ?Keep your dressing and/or splint clean and dry at all times.  Do not remove until you are seen again in the office.  If careful, you may place a plastic bag over your bandage and tape the end to shower, but be careful, do not get your bandages wet.  ?  ? ?HAND THERAPY:  ?You may not need any. If you do, we will begin this at your follow up visit in the clinic.  ? ? ?ACTIVITY AND WORK: ?Light use of the fingers is allowed to assist the other hand with daily hygiene and eating, but strong gripping or lifting is often uncomfortable and should be avoided.  ?You might miss a variable period of time from work and hopefully this issue has been discussed prior to surgery. You may not  do any heavy work with your affected hand for about 2 weeks.  ? ? ?[place] ?[address] ?[place],  [place]  [address] ?[phone]  ?

## 2021-11-28 NOTE — Interval H&P Note (Signed)
History and Physical Interval Note: ? ?11/28/2021 ?1:04 PM ? ?MUREL WIGLE  has presented today for surgery, with the diagnosis of RIGHT THUMB CARPOMETACARPAL ARTHRITIS.  The various methods of treatment have been discussed with the patient and family. After consideration of risks, benefits and other options for treatment, the patient has consented to  Procedure(s): ?RIGHT THUMB LIGAMENT RECONSTRUCTION AND TENDON INTERPOSITION (Right) as a surgical intervention.  The patient's history has been reviewed, patient examined, no change in status, stable for surgery.  I have reviewed the patient's chart and labs.  Questions were answered to the patient's satisfaction.   ? ? ? Keaundre Thelin ? ? ?

## 2021-11-28 NOTE — Anesthesia Procedure Notes (Signed)
Procedure Name: [place] ?Date/Time: 11/28/2021 1:23 PM ?Performed by: Kyung Rudd, CRNA ?Pre-anesthesia Checklist: Patient identified, Emergency Drugs available, Suction available and Patient being monitored ?Patient Re-evaluated:Patient Re-evaluated prior to induction ?Oxygen Delivery Method: Simple face mask ?Induction Type: IV induction ?Placement Confirmation: positive ETCO2 ?Dental Injury: Teeth and Oropharynx as per pre-operative assessment  ? ? ? ? ?

## 2021-11-29 ENCOUNTER — Encounter (HOSPITAL_COMMUNITY): Payer: Self-pay | Admitting: Orthopedic Surgery

## 2021-11-29 NOTE — Anesthesia Postprocedure Evaluation (Signed)
Anesthesia Post Note ? ?Patient: SHAYLEA UCCI ? ?Procedure(s) Performed: RIGHT THUMB LIGAMENT RECONSTRUCTION AND TENDON INTERPOSITION (Right) ? ?  ? ?Patient location during evaluation: PACU ?Anesthesia Type: Regional and MAC ?Level of consciousness: awake and alert ?Pain management: pain level controlled ?Vital Signs Assessment: post-procedure vital signs reviewed and stable ?Respiratory status: spontaneous breathing, nonlabored ventilation, respiratory function stable and patient connected to nasal cannula oxygen ?Cardiovascular status: stable and blood pressure returned to baseline ?Postop Assessment: no apparent nausea or vomiting ?Anesthetic complications: no ? ? ?No notable events documented. ? ?Last Vitals:  ?Vitals:  ? 11/28/21 1537 11/28/21 1552  ?BP: 125/64 (!) 107/50  ?Pulse: 66 65  ?Resp: 17 (!) 21  ?Temp:  36.7 ?C  ?SpO2: 97% 99%  ?  ?Last Pain:  ?Vitals:  ? 11/28/21 1552  ?PainSc: 0-No pain  ? ? ?  ?  ?  ?  ?  ?  ? ?Senai Kingsley ? ? ? ? ?

## 2021-12-04 ENCOUNTER — Telehealth: Payer: Self-pay

## 2021-12-04 ENCOUNTER — Ambulatory Visit: Payer: Medicare HMO | Admitting: Orthopedic Surgery

## 2021-12-04 ENCOUNTER — Encounter (HOSPITAL_COMMUNITY): Payer: Self-pay | Admitting: Emergency Medicine

## 2021-12-04 ENCOUNTER — Ambulatory Visit (HOSPITAL_COMMUNITY)
Admission: EM | Admit: 2021-12-04 | Discharge: 2021-12-04 | Disposition: A | Discharge status: Home / Self Care | Payer: Medicare Other | Attending: Student | Admitting: Student

## 2021-12-04 ENCOUNTER — Ambulatory Visit: Payer: Medicaid Other | Admitting: Family Medicine

## 2021-12-04 ENCOUNTER — Ambulatory Visit (INDEPENDENT_AMBULATORY_CARE_PROVIDER_SITE_OTHER): Payer: Medicare Other

## 2021-12-04 DIAGNOSIS — S39012A Strain of muscle, fascia and tendon of lower back, initial encounter: Secondary | ICD-10-CM | POA: Diagnosis not present

## 2021-12-04 DIAGNOSIS — M5136 Other intervertebral disc degeneration, lumbar region: Secondary | ICD-10-CM

## 2021-12-04 DIAGNOSIS — M8588 Other specified disorders of bone density and structure, other site: Secondary | ICD-10-CM | POA: Diagnosis not present

## 2021-12-04 DIAGNOSIS — M545 Low back pain, unspecified: Secondary | ICD-10-CM | POA: Diagnosis not present

## 2021-12-04 MED ORDER — TIZANIDINE HCL 2 MG PO TABS: 2.0000 mg | ORAL_TABLET | Freq: Three times a day (TID) | ORAL | 0 refills | Status: DC | PRN
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
## 2021-12-04 NOTE — Discharge Instructions (Addendum)
-  Your xray looks normal. You do not have any broken bones.  ?-Start the muscle relaxer-Zanaflex (tizanidine), up to 3 times daily for muscle spasms and pain.  This can make you drowsy, so take at bedtime or when you do not need to drive or operate machinery. ?-Follow-up with PCP if symptoms persist ?

## 2021-12-04 NOTE — Telephone Encounter (Signed)
Patient called in and would like a refill on her oxycodone. Please advise  ?

## 2021-12-04 NOTE — ED Provider Notes (Signed)
Local store. ?[place] ? ? ? ?CSN: [id number] ?Arrival date & time: 12/04/21  1020 ? ? ?  ? ?History   ?Chief Complaint ?Chief Complaint  ?Patient presents with  ? Back Pain  ? ? ?HPI ?Lori [name] is a 66 y.o. female presenting with lower back pain x7 days. History HSV, COPD, restrictive lung ds, chronic back pain, degenerative disc disease.  Describes back pain for 7 days following a minor injury that occurred at a local store- she was bending to put her bags in her shopping cart when the cashier spun the bag holder and it hit her lower back.  States since then she has had diffuse excruciating back pain.  Denies radicular symptoms including numbness, tingling, pain shooting down her legs.  Denies new weakness in the legs.  Denies urinary symptoms.  Denies abdominal pain.  Has not tried ANY interventions at home.  She has a long history of back pain and degenerative disc disease already. ? ?HPI ? ?Past Medical History:  ?Diagnosis Date  ? Allergic rhinitis   ? Anemia   ? hgb 13.9 on 11/22/21  ? Ankle fracture 05/2016  ? Anxiety   ? Arthritis   ? Asthma   ? Barrett esophagus 2007  ? Chest pain   ? Hx, no current problems as of 11/22/21.  Recurrent episodes.  Has had 3 negative nuclear stress test, and a completely normal coronary CT angiogram  ? COPD (chronic obstructive pulmonary disease) with chronic bronchitis (HCC)   ? & Emphysema  ? Depression   ? Duodenitis without mention of hemorrhage 2007  ? Esophageal reflux 2007  ? Esophageal stricture   ? Full dentures   ? patient does not wear dentures as of 11/22/21  ? H/O hiatal hernia   ? Hiatal hernia [id number]  ? HSV infection   ? Hyperlipidemia   ? rx crestor but pt not taking  ? Multiple fractures   ? from falls, fx rt. elbow, fx left wrist, bilateral ankles  ? Pneumonia 10/2016  ? Restrictive lung disease 06/30/2017  ? Ulcer   ? no current problems as of 11/22/21  ? ? ?Patient Active Problem List  ? Diagnosis Date Noted  ? Arthritis of carpometacarpal  [facility]) joint of right thumb 10/25/2021  ? Right-sided chest pain 09/05/2021  ? Neck pain 04/11/2021  ? Thoracic spine pain 04/11/2021  ? Low back pain 04/11/2021  ? Bilateral hip pain 04/11/2021  ? AKI (acute kidney injury) ([place]) 04/11/2021  ? Allergic rhinitis 01/08/2021  ? Abnormal urine odor 11/14/2020  ? Aortic atherosclerosis ([place]) 11/14/2020  ? Severe persistent asthma without complication 21/19/4174  ? Hymenoptera allergy 11/02/2020  ? Genital herpes 11/12/2019  ? Coccyx pain 11/12/2019  ? Insomnia 11/12/2019  ? Acute right-sided low back pain without sciatica 07/20/2019  ? Grief 07/20/2019  ? Vitamin D deficiency 07/20/2019  ? Hyperglycemia 04/21/2019  ? Left knee pain 04/21/2019  ? Pain 12/29/2018  ? B12 deficiency 10/20/2018  ? Bilateral hand pain 08/03/2018  ? Nausea 04/27/2018  ? Anemia, iron deficiency 04/27/2018  ? Intermittent paresthesia of hand and foot 04/27/2018  ? Headache 03/21/2018  ? Lumbar radiculitis 08/15/2017  ? Degenerative disc disease, lumbar 07/21/2017  ? Encounter for well adult exam with abnormal findings 06/30/2017  ? Epigastric pain 06/30/2017  ? Restrictive lung disease 06/30/2017  ? Leg cramping 06/09/2017  ? Elbow contusion 06/09/2017  ? Right elbow pain 05/27/2017  ? Asthma-COPD overlap syndrome ([place]) 01/31/2017  ? Seasonal  and perennial allergic rhinitis 01/01/2017  ? Moderate persistent asthma, uncomplicated 07/37/1062  ? Pulmonary emphysema ([place]) 12/03/2016  ? Tobacco abuse 12/03/2016  ? Non-seasonal allergic rhinitis due to fungal spores 12/03/2016  ? Medication management 11/29/2016  ? Rash 11/23/2016  ? Vertigo 10/22/2016  ? Gastroesophageal reflux disease with esophagitis 10/22/2016  ? Non compliance w medication regimen 08/01/2016  ? Allergic contact dermatitis due to metals 08/01/2016  ? Migraine without aura and without status migrainosus, not intractable 07/22/2016  ? Spasm of muscle of lower back 06/21/2016  ? Chronic rhinitis 06/19/2016  ? Compliance poor 06/19/2016   ? Gait difficulty 06/21/2015  ? Fibromyalgia 06/21/2015  ? Conversion reaction 06/21/2015  ? Depression 06/21/2015  ? Syncope and collapse 06/21/2015  ? Plica syndrome of left knee 01/06/2015  ? Anxiety state 09/06/2014  ? History of MI 09/06/2014  ? Cigarette smoker 06/27/2013  ? Borderline hypertension 06/27/2013  ? Chronic low back pain with sciatica 06/14/2013  ? Leg pain, bilateral 06/14/2013  ? Gastroparesis 10/26/2009  ? Musculoskeletal chest pain 05/02/2009  ? Hyperlipidemia LDL goal <100 11/18/2008  ? COPD exacerbation ([place]) 11/18/2008  ? HEPATIC CYST 11/18/2008  ? ? ?Past Surgical History:  ?Procedure Laterality Date  ? ABDOMINAL HYSTERECTOMY    ? BSO  ? APPENDECTOMY    ? CESAREAN SECTION    ? x 2  ? CHOLECYSTECTOMY    ? CHONDROPLASTY Left 01/06/2015  ? Procedure: CHONDROPLASTY;  Surgeon: Marybelle Killings, MD;  Location: [place];  Service: Orthopedics;  Laterality: Left;  ? COLONOSCOPY    ? CT CTA CORONARY W/CA SCORE W/CM &/OR WO/CM  12/02/2016  ? CORONARY CALCIUM SCORE 0.  NO EVIDENCE OF CAD.  Normal-sized PA.  Normal pulmonary venous drainage the left atrium.  No ASD or VSD.  ? ELBOW FRACTURE SURGERY    ? right  ? EYE SURGERY Bilateral   ? cataracts removed  ? KNEE ARTHROSCOPY WITH EXCISION PLICA Left 69/48/5462  ? Procedure: KNEE ARTHROSCOPY WITH EXCISION PLICA;  Surgeon: Marybelle Killings, MD;  Location: [place];  Service: Orthopedics;  Laterality: Left;  ? LIGAMENT REPAIR Right 11/28/2021  ? Procedure: RIGHT THUMB LIGAMENT RECONSTRUCTION AND TENDON INTERPOSITION;  Surgeon: Sherilyn Cooter, MD;  Location: [place];  Service: Orthopedics;  Laterality: Right;  ? Lower Extremity Arterial Dopplers  07/06/2013  ? RABI 1.0, LABI 1.1.; No evidence of significant vascular atherogenic plaque  ? NECK SURGERY    ? NM MYOVIEW LTD  09/2014  ? a) ?LOW RISK. Normal EF of 65%, no RWMA. NO ISCHEMIA OR INFARCTION.;; b) 05/02/2009: Normal study.  LOW RISK.  No ischemia or infarction.  EF 74%.   Breast attenuation noted.  ? TONSILLECTOMY    ? TRANSTHORACIC ECHOCARDIOGRAM  03/28/2021  ? a) Normal EF 60 to 65%.  GR 1 DD.  No R WMA.  Normal RV size and function.  Normal RAP.  Normal valves. => NORMAL;; b) 04/23/16/2010: Normal study.  LOW RISK.  No ischemia or infarction.  EF 74%.  Breast attenuation noted.17/2010: ([facility]) EF>55%.  Normal wall motion.  Normal valves.  ? WRIST FRACTURE SURGERY    ? left, has plate  ? ? ?OB History   ?No obstetric history on file. ?  ? ? ? ?Home Medications   ? ?Prior to Admission medications   ?Medication Sig Start Date End Date Taking? Authorizing Provider  ?tiZANidine (ZANAFLEX) 2 MG tablet Take 1 tablet (2 mg total) by mouth every 8 (eight) hours  as needed for muscle spasms. 12/04/21  Yes Hazel Sams, PA-C  ?AIMOVIG 140 MG/ML SOAJ Inject 140 mg into the skin every 30 (thirty) days. 01/09/21   [provider]  ?ALPRAZolam Duanne Moron) 1 MG tablet TAKE 1 TABLET BY MOUTH AT BEDTIME AS NEEDED FOR ANXIETY 06/19/21   Biagio Borg, MD  ?Budeson-Glycopyrrol-Formoterol (BREZTRI AEROSPHERE) 160-9-4.8 MCG/ACT AERO Inhale 2 puffs once a day with spacer to help prevent cough and wheeze. ?Patient taking differently: Inhale 2 puffs into the lungs daily as needed (coughing and wheezing). Inhale 2 puffs once a day with spacer to help prevent cough and wheeze. 09/27/21   Althea Charon, [place]  ?DEXILANT 60 MG capsule Take 1 capsule (60 mg total) by mouth daily. ?Patient taking differently: Take 1 capsule by mouth in the morning and at bedtime. 09/27/21   Althea Charon, [place]  ?EPINEPHrine 0.3 mg/0.3 mL IJ SOAJ injection INJECT AS DIRECTED FOR SEVERE ALLERGIC REACTION 09/27/21   Althea Charon, FNP  ?FASENRA 30 MG/ML SOSY INJECT '30MG'$  SUBCUTANEOUSLY  EVERY 8 WEEKS 09/04/21   Valentina Shaggy, MD  ?gabapentin (NEURONTIN) 300 MG capsule Take 300 mg by mouth 2 (two) times daily. 07/13/20   [provider]  ?meclizine (ANTIVERT) 25 MG tablet TAKE 1 TABLET BY MOUTH THREE TIMES DAILY AS  NEEDED FOR DIZZINESS 11/24/21   Biagio Borg, MD  ?montelukast (SINGULAIR) 10 MG tablet Take 1 tablet (10 mg total) by mouth at bedtime. 09/27/21   Althea Charon, FNP  ?nitroGLYCERIN (NITROSTAT) 0.4 MG S

## 2021-12-05 ENCOUNTER — Ambulatory Visit (INDEPENDENT_AMBULATORY_CARE_PROVIDER_SITE_OTHER): Payer: Medicare Other

## 2021-12-05 ENCOUNTER — Other Ambulatory Visit: Payer: Self-pay

## 2021-12-05 DIAGNOSIS — J4550 Severe persistent asthma, uncomplicated: Secondary | ICD-10-CM | POA: Diagnosis not present

## 2021-12-05 NOTE — Telephone Encounter (Signed)
Please see message below. Patient is s/p RIGHT THUMB LIGAMENT RECONSTRUCTION AND TENDON INTERPOSITION on 11/28/2021 ?

## 2021-12-06 ENCOUNTER — Telehealth: Payer: Self-pay | Admitting: Orthopedic Surgery

## 2021-12-06 ENCOUNTER — Ambulatory Visit: Payer: Medicare HMO | Admitting: Orthopedic Surgery

## 2021-12-06 NOTE — Telephone Encounter (Signed)
Patient called needing Rx refilled Oxycodone. Patient uses [facility] on [address]. ?Patient said she is having a lot of muscle spasm in her arm and sharp pain shooting through it. ?The number to contact patient is [phone] ?

## 2021-12-07 ENCOUNTER — Ambulatory Visit: Payer: Medicare HMO

## 2021-12-10 DIAGNOSIS — J302 Other seasonal allergic rhinitis: Secondary | ICD-10-CM | POA: Diagnosis not present

## 2021-12-10 NOTE — Progress Notes (Signed)
VIALS EXP 12-11-22 ?

## 2021-12-11 DIAGNOSIS — J3081 Allergic rhinitis due to animal (cat) (dog) hair and dander: Secondary | ICD-10-CM | POA: Diagnosis not present

## 2021-12-12 ENCOUNTER — Ambulatory Visit (INDEPENDENT_AMBULATORY_CARE_PROVIDER_SITE_OTHER): Payer: Medicare Other

## 2021-12-12 DIAGNOSIS — G43719 Chronic migraine without aura, intractable, without status migrainosus: Secondary | ICD-10-CM | POA: Diagnosis not present

## 2021-12-12 DIAGNOSIS — J309 Allergic rhinitis, unspecified: Secondary | ICD-10-CM | POA: Diagnosis not present

## 2021-12-13 ENCOUNTER — Ambulatory Visit (INDEPENDENT_AMBULATORY_CARE_PROVIDER_SITE_OTHER): Payer: Medicaid Other | Admitting: Orthopedic Surgery

## 2021-12-13 DIAGNOSIS — M1811 Unilateral primary osteoarthritis of first carpometacarpal joint, right hand: Secondary | ICD-10-CM

## 2021-12-13 MED ORDER — OXYCODONE HCL 5 MG PO TABS: 5.0000 mg | ORAL_TABLET | Freq: Four times a day (QID) | ORAL | 0 refills | Status: AC | PRN

## 2021-12-13 NOTE — Progress Notes (Signed)
? ?Post-Op Visit Note ?  ?Patient: Lori [name]           ?Date of Birth: 02/01/1956           ?MRN: [id number] ?Visit Date: 12/13/2021 ?PCP: Biagio Borg, MD ? ? ?Assessment & Plan: ? ?Chief Complaint:  ?Chief Complaint  ?Patient presents with  ? Right Hand - Routine Post Op  ? ?Visit Diagnoses:  ?1. Arthritis of carpometacarpal (CMC) joint of right thumb   ? ? ?Plan: Patient is two weeks s/p R LRTI.  She is doing well postoperatively.  The incision is clean, dry, and well healed.  Sutures removed.  She was put back into a thumb spica splint. We will start her in hand therapy to have a thermoplast thumb spica splint made and gradually work on thumb ROM and strengthening per the [place] protocol.  ? ?Follow-Up Instructions: No follow-ups on file.  ? ?Orders:  ?No orders of the defined types were placed in this encounter. ? ?No orders of the defined types were placed in this encounter. ? ? ?Imaging: ?No results found. ? ?PMFS History: ?Patient Active Problem List  ? Diagnosis Date Noted  ? Arthritis of carpometacarpal [facility]) joint of right thumb 10/25/2021  ? Right-sided chest pain 09/05/2021  ? Neck pain 04/11/2021  ? Thoracic spine pain 04/11/2021  ? Low back pain 04/11/2021  ? Bilateral hip pain 04/11/2021  ? AKI (acute kidney injury) ([place]) 04/11/2021  ? Allergic rhinitis 01/08/2021  ? Abnormal urine odor 11/14/2020  ? Aortic atherosclerosis ([place]) 11/14/2020  ? Severe persistent asthma without complication 67/34/1937  ? Hymenoptera allergy 11/02/2020  ? Genital herpes 11/12/2019  ? Coccyx pain 11/12/2019  ? Insomnia 11/12/2019  ? Acute right-sided low back pain without sciatica 07/20/2019  ? Grief 07/20/2019  ? Vitamin D deficiency 07/20/2019  ? Hyperglycemia 04/21/2019  ? Left knee pain 04/21/2019  ? Pain 12/29/2018  ? B12 deficiency 10/20/2018  ? Bilateral hand pain 08/03/2018  ? Nausea 04/27/2018  ? Anemia, iron deficiency 04/27/2018  ? Intermittent paresthesia of hand and foot 04/27/2018  ? Headache  03/21/2018  ? Lumbar radiculitis 08/15/2017  ? Degenerative disc disease, lumbar 07/21/2017  ? Encounter for well adult exam with abnormal findings 06/30/2017  ? Epigastric pain 06/30/2017  ? Restrictive lung disease 06/30/2017  ? Leg cramping 06/09/2017  ? Elbow contusion 06/09/2017  ? Right elbow pain 05/27/2017  ? Asthma-COPD overlap syndrome ([place]) 01/31/2017  ? Seasonal and perennial allergic rhinitis 01/01/2017  ? Moderate persistent asthma, uncomplicated 90/24/0973  ? Pulmonary emphysema ([place]) 12/03/2016  ? Tobacco abuse 12/03/2016  ? Non-seasonal allergic rhinitis due to fungal spores 12/03/2016  ? Medication management 11/29/2016  ? Rash 11/23/2016  ? Vertigo 10/22/2016  ? Gastroesophageal reflux disease with esophagitis 10/22/2016  ? Non compliance w medication regimen 08/01/2016  ? Allergic contact dermatitis due to metals 08/01/2016  ? Migraine without aura and without status migrainosus, not intractable 07/22/2016  ? Spasm of muscle of lower back 06/21/2016  ? Chronic rhinitis 06/19/2016  ? Compliance poor 06/19/2016  ? Gait difficulty 06/21/2015  ? Fibromyalgia 06/21/2015  ? Conversion reaction 06/21/2015  ? Depression 06/21/2015  ? Syncope and collapse 06/21/2015  ? Plica syndrome of left knee 01/06/2015  ? Anxiety state 09/06/2014  ? History of MI 09/06/2014  ? Cigarette smoker 06/27/2013  ? Borderline hypertension 06/27/2013  ? Chronic low back pain with sciatica 06/14/2013  ? Leg pain, bilateral 06/14/2013  ? Gastroparesis 10/26/2009  ? Musculoskeletal chest  pain 05/02/2009  ? Hyperlipidemia LDL goal <100 11/18/2008  ? COPD exacerbation ([employer]) 11/18/2008  ? HEPATIC CYST 11/18/2008  ? ?Past Medical History:  ?Diagnosis Date  ? Allergic rhinitis   ? Anemia   ? hgb 13.9 on 11/22/21  ? Ankle fracture 05/2016  ? Anxiety   ? Arthritis   ? Asthma   ? Barrett esophagus 2007  ? Chest pain   ? Hx, no current problems as of 11/22/21.  Recurrent episodes.  Has had 3 negative nuclear stress test, and a completely  normal coronary CT angiogram  ? COPD (chronic obstructive pulmonary disease) with chronic bronchitis (HCC)   ? & Emphysema  ? Depression   ? Duodenitis without mention of hemorrhage 2007  ? Esophageal reflux 2007  ? Esophageal stricture   ? Full dentures   ? patient does not wear dentures as of 11/22/21  ? H/O hiatal hernia   ? Hiatal hernia [id number]  ? HSV infection   ? Hyperlipidemia   ? rx crestor but pt not taking  ? Multiple fractures   ? from falls, fx rt. elbow, fx left wrist, bilateral ankles  ? Pneumonia 10/2016  ? Restrictive lung disease 06/30/2017  ? Ulcer   ? no current problems as of 11/22/21  ?  ?Family History  ?Problem Relation Age of Onset  ? Emphysema Father   ? Dementia Mother   ? Diabetes Maternal Grandmother   ? Diabetes Maternal Aunt   ? Diabetes Other   ?     mat. cousin  ? Heart disease Brother   ? Colon cancer Neg Hx   ? Allergic rhinitis Neg Hx   ? Angioedema Neg Hx   ? Asthma Neg Hx   ? Atopy Neg Hx   ? Eczema Neg Hx   ? Immunodeficiency Neg Hx   ? Urticaria Neg Hx   ?  ?Past Surgical History:  ?Procedure Laterality Date  ? ABDOMINAL HYSTERECTOMY    ? BSO  ? APPENDECTOMY    ? CESAREAN SECTION    ? x 2  ? CHOLECYSTECTOMY    ? CHONDROPLASTY Left 01/06/2015  ? Procedure: CHONDROPLASTY;  Surgeon: Marybelle Killings, MD;  Location: [place];  Service: Orthopedics;  Laterality: Left;  ? COLONOSCOPY    ? CT CTA CORONARY W/CA SCORE W/CM &/OR WO/CM  12/02/2016  ? CORONARY CALCIUM SCORE 0.  NO EVIDENCE OF CAD.  Normal-sized PA.  Normal pulmonary venous drainage the left atrium.  No ASD or VSD.  ? ELBOW FRACTURE SURGERY    ? right  ? EYE SURGERY Bilateral   ? cataracts removed  ? KNEE ARTHROSCOPY WITH EXCISION PLICA Left 83/66/2947  ? Procedure: KNEE ARTHROSCOPY WITH EXCISION PLICA;  Surgeon: Marybelle Killings, MD;  Location: [place];  Service: Orthopedics;  Laterality: Left;  ? LIGAMENT REPAIR Right 11/28/2021  ? Procedure: RIGHT THUMB LIGAMENT RECONSTRUCTION AND TENDON  INTERPOSITION;  Surgeon: Sherilyn Cooter, MD;  Location: [place];  Service: Orthopedics;  Laterality: Right;  ? Lower Extremity Arterial Dopplers  07/06/2013  ? RABI 1.0, LABI 1.1.; No evidence of significant vascular atherogenic plaque  ? NECK SURGERY    ? NM MYOVIEW LTD  09/2014  ? a) ?LOW RISK. Normal EF of 65%, no RWMA. NO ISCHEMIA OR INFARCTION.;; b) 05/02/2009: Normal study.  LOW RISK.  No ischemia or infarction.  EF 74%.  Breast attenuation noted.  ? TONSILLECTOMY    ? TRANSTHORACIC ECHOCARDIOGRAM  03/28/2021  ? a) Normal  EF 60 to 65%.  GR 1 DD.  No R WMA.  Normal RV size and function.  Normal RAP.  Normal valves. => NORMAL;; b) 04/23/16/2010: Normal study.  LOW RISK.  No ischemia or infarction.  EF 74%.  Breast attenuation noted.17/2010: ([facility]) EF>55%.  Normal wall motion.  Normal valves.  ? WRIST FRACTURE SURGERY    ? left, has plate  ? ?Social History  ? ?Occupational History  ? Occupation: disable  ?Tobacco Use  ? Smoking status: Every Day  ?  Packs/day: 0.50  ?  Years: 39.00  ?  Pack years: 19.50  ?  Types: Cigarettes  ?  Start date: 08/06/1977  ? Smokeless tobacco: Never  ? Tobacco comments:  ?  Patient has cut back to 2-3 per day as of 11/22/21  ?Vaping Use  ? Vaping Use: Never used  ?Substance and Sexual Activity  ? Alcohol use: No  ?  Alcohol/week: 0.0 standard drinks  ? Drug use: No  ? Sexual activity: Not Currently  ?  Birth control/protection: Surgical  ?  Comment: Hysterectomy  ? ? ? ?

## 2021-12-18 ENCOUNTER — Other Ambulatory Visit: Payer: Self-pay | Admitting: Internal Medicine

## 2021-12-19 ENCOUNTER — Telehealth: Payer: Self-pay

## 2021-12-19 NOTE — Telephone Encounter (Signed)
This pt has ASCVD but may not be on a moderate to high intensity statin. ?[facility] asking Dr. Jenny Reichmann to re-evaluate and add a statin. ? ?Jinny Blossom QH [phone] ? ? ? ? ?

## 2021-12-20 ENCOUNTER — Telehealth: Payer: Self-pay | Admitting: Orthopedic Surgery

## 2021-12-20 ENCOUNTER — Ambulatory Visit (INDEPENDENT_AMBULATORY_CARE_PROVIDER_SITE_OTHER): Payer: Medicare Other

## 2021-12-20 DIAGNOSIS — J309 Allergic rhinitis, unspecified: Secondary | ICD-10-CM | POA: Diagnosis not present

## 2021-12-20 NOTE — Telephone Encounter (Signed)
I contacted the patient.  She complains of stinging and burning.  She said this has been going on since surgery and she did not think the oxycodone was enough.  She denies any fever or chills.  She does not want to look at her arm or check the incision herself.  This is consistent pain that she has had since surgery.  She does not have any numbness or coolness in her hand per her report.  I did discuss with her the possibility of taking Motrin in between.  Emphasized the importance of elevating her hand above her heart level.  If she has concerns and has significant increase in pain I have told her she will need to have it evaluated in the emergency room where they can take a look at the incision as our offices are closed tomorrow that she should not hesitate to do this. ?

## 2021-12-20 NOTE — Telephone Encounter (Signed)
Pt called requesting a call back from [place]. Pt states her hand is burning, swollen and pained if she can come in Monday. Dr Tempie Donning has nothing until 4/18. Please call pt about this matter at [phone] ?

## 2021-12-23 ENCOUNTER — Other Ambulatory Visit: Payer: Self-pay | Admitting: Orthopedic Surgery

## 2021-12-23 MED ORDER — ONDANSETRON HCL 4 MG PO TABS: 4.0000 mg | ORAL_TABLET | Freq: Three times a day (TID) | ORAL | 0 refills | Status: DC | PRN

## 2021-12-25 ENCOUNTER — Telehealth: Payer: Self-pay | Admitting: Orthopedic Surgery

## 2021-12-25 ENCOUNTER — Other Ambulatory Visit: Payer: Self-pay | Admitting: Orthopedic Surgery

## 2021-12-25 DIAGNOSIS — M1811 Unilateral primary osteoarthritis of first carpometacarpal joint, right hand: Secondary | ICD-10-CM | POA: Diagnosis not present

## 2021-12-25 DIAGNOSIS — M25531 Pain in right wrist: Secondary | ICD-10-CM | POA: Diagnosis not present

## 2021-12-25 MED ORDER — TRAMADOL HCL 50 MG PO TABS: 50.0000 mg | ORAL_TABLET | Freq: Four times a day (QID) | ORAL | 0 refills | Status: AC | PRN

## 2021-12-25 MED ORDER — MELOXICAM 7.5 MG PO TABS: 7.5000 mg | ORAL_TABLET | Freq: Every day | ORAL | 0 refills | Status: AC

## 2021-12-25 NOTE — Telephone Encounter (Signed)
Attempted to contact patient however had to leave a voicemail informing her that medication was sent into pharmacy.  ?

## 2021-12-25 NOTE — Telephone Encounter (Signed)
Pt called requesting to change pain medication to tramadol. She states the other meds made her sick to her stomach. Please send to [place]. Pt phone number is [phone] ?

## 2021-12-31 ENCOUNTER — Encounter: Payer: Medicare Other | Admitting: Rehabilitative and Restorative Service Providers"

## 2022-01-05 IMAGING — DX DG SACRUM/COCCYX 2+V
3 series · 3 of 3 positions shown · non-contrast
Comparison: December 15, 2015.

CLINICAL DATA: Sacral pain after fall 3 months ago.

EXAM:
SACRUM AND COCCYX - 2+ VIEW

[sacrum 20° caudo-cranial ap]
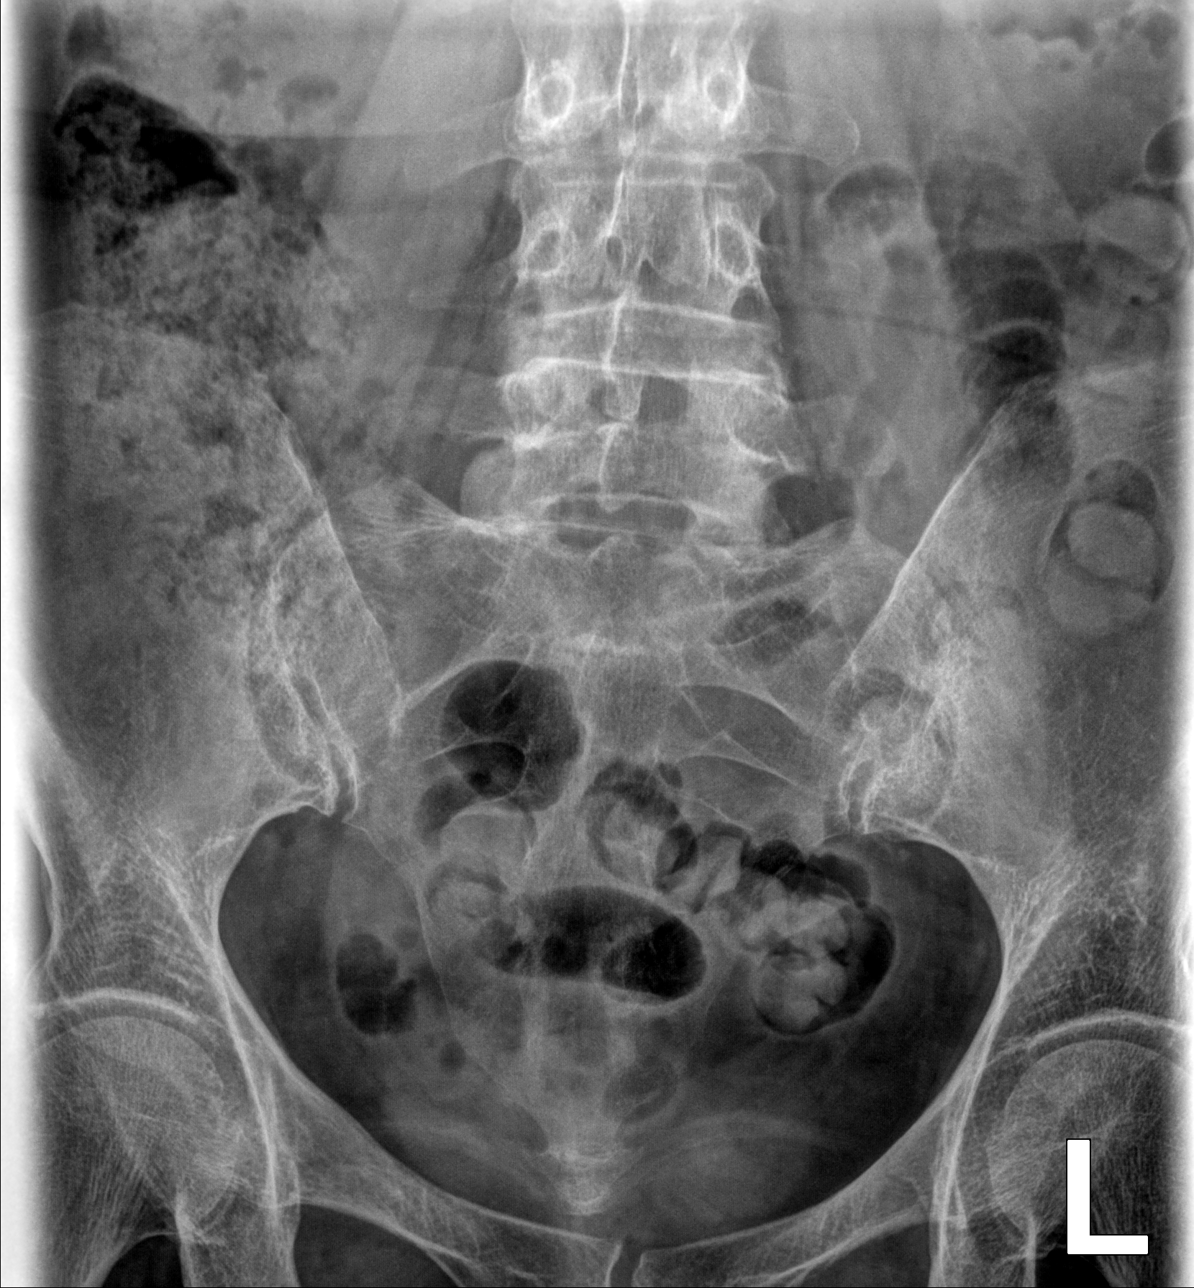

[coccyx 20° cranio-caudal ap]
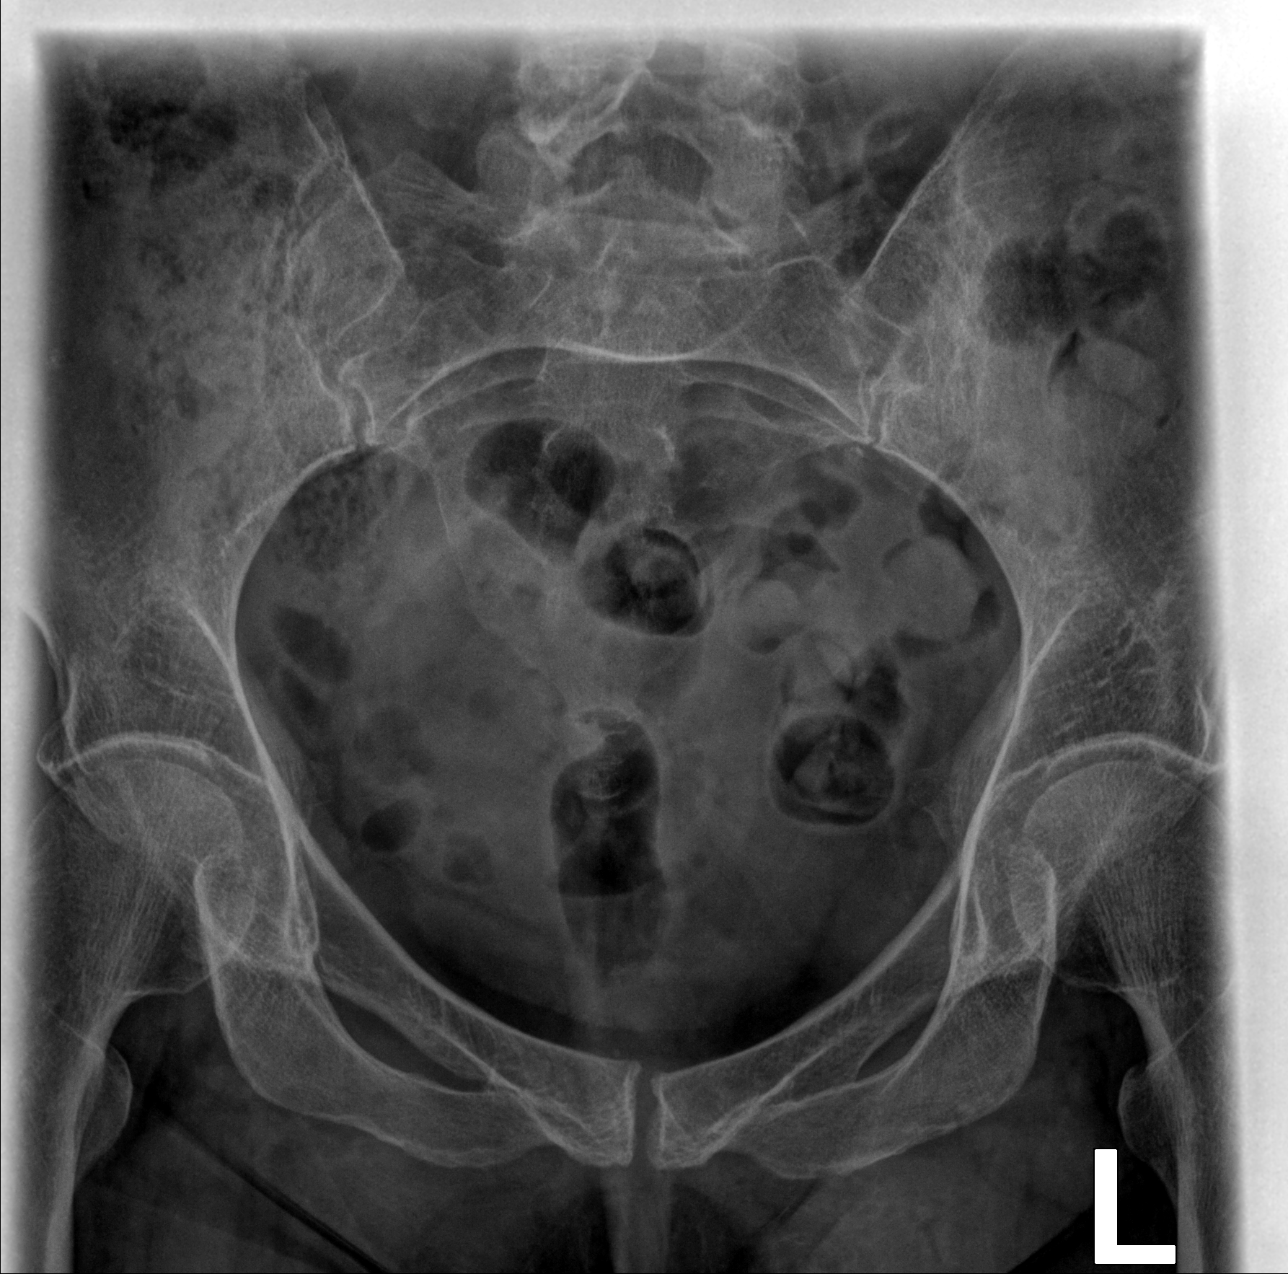

[sacrum lat]
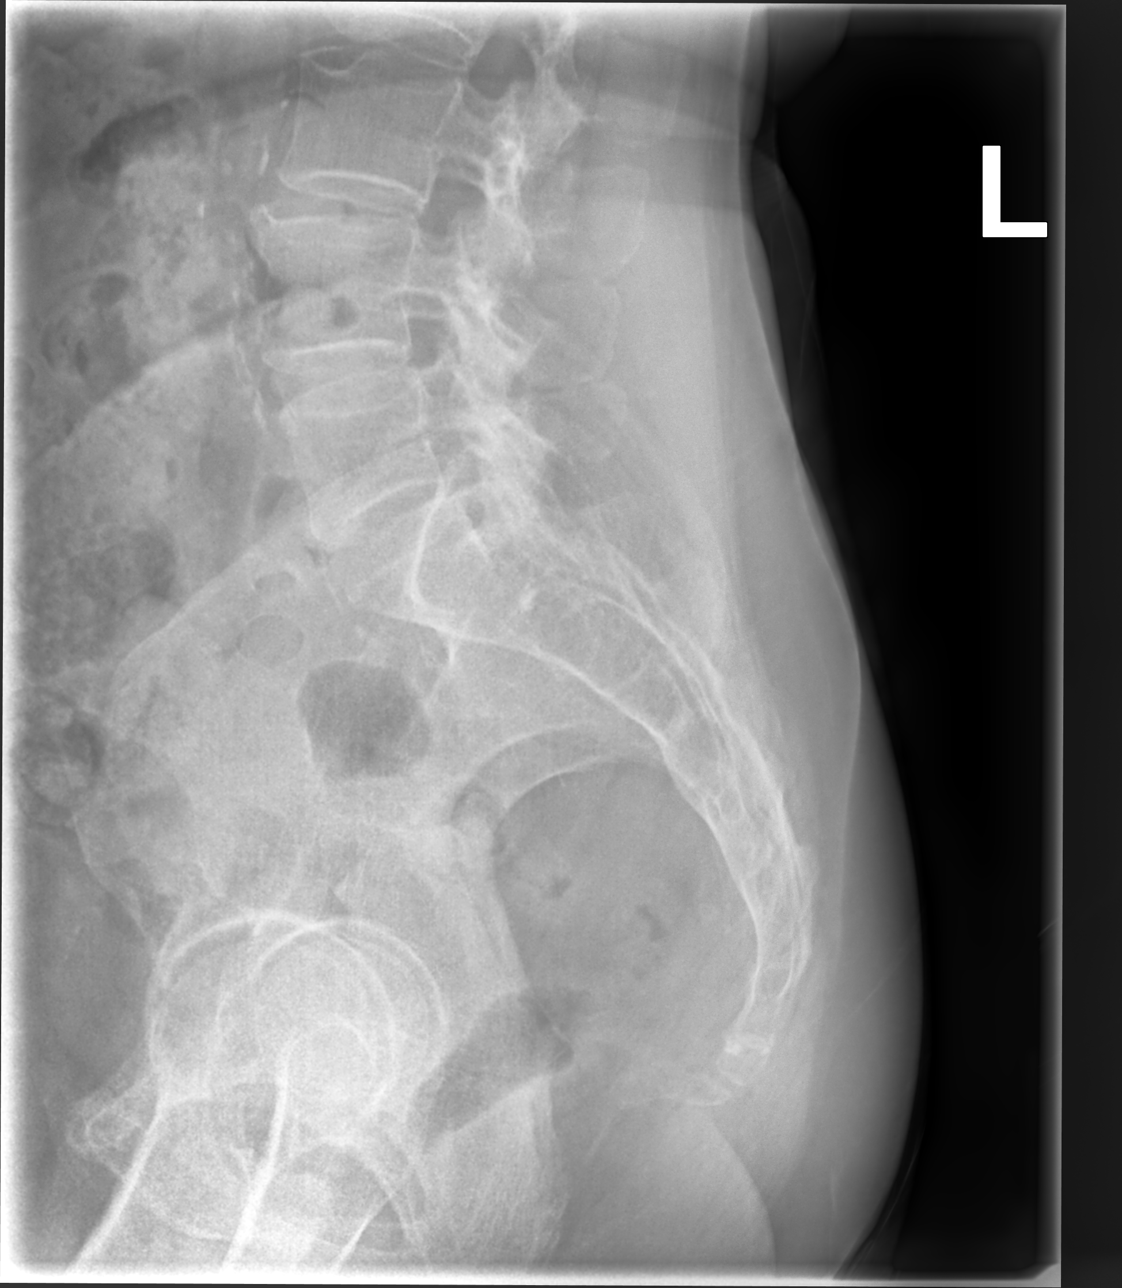

[3 of 3 positions shown; findings below may reference images not displayed]

FINDINGS: There is no evidence of fracture or other focal bone lesions.
IMPRESSION: Negative.

## 2022-01-05 IMAGING — DX DG LUMBAR SPINE COMPLETE 4+V
5 series · 5 of 5 positions shown · non-contrast
Comparison: July 20, 2019.

CLINICAL DATA: Lower back pain after fall 3 months ago.

EXAM:
LUMBAR SPINE - COMPLETE 4+ VIEW

[lumbar spine ap]
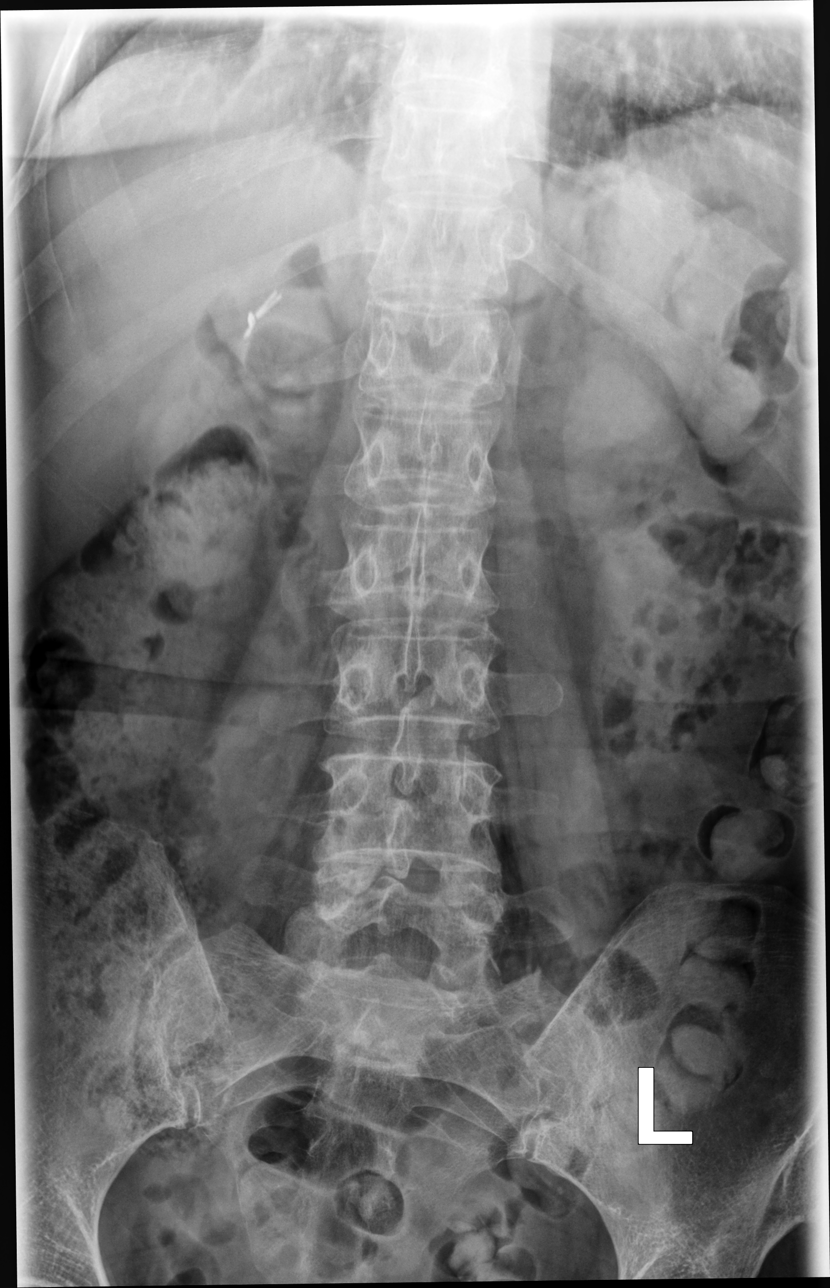

[lumbar spine mlo (1 of 2)]
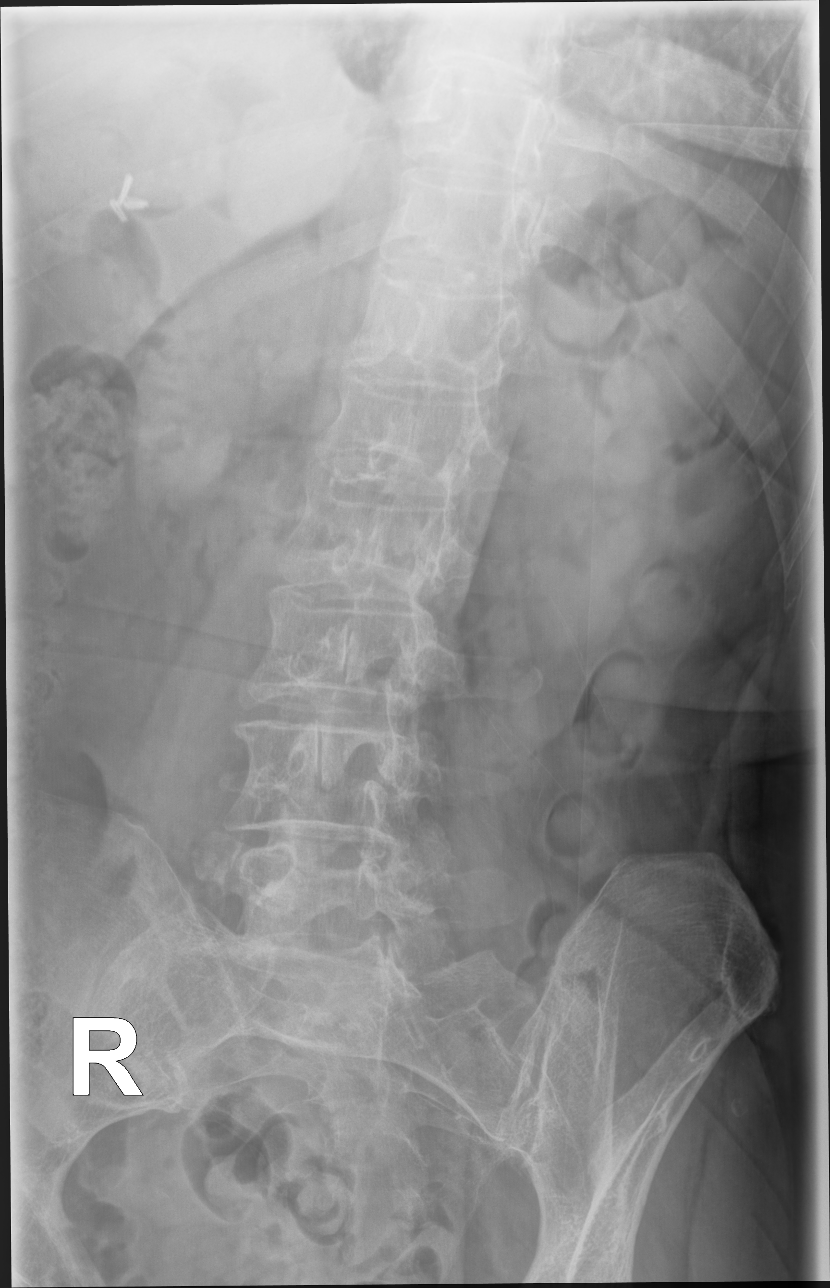

[lumbar spine mlo (2 of 2)]
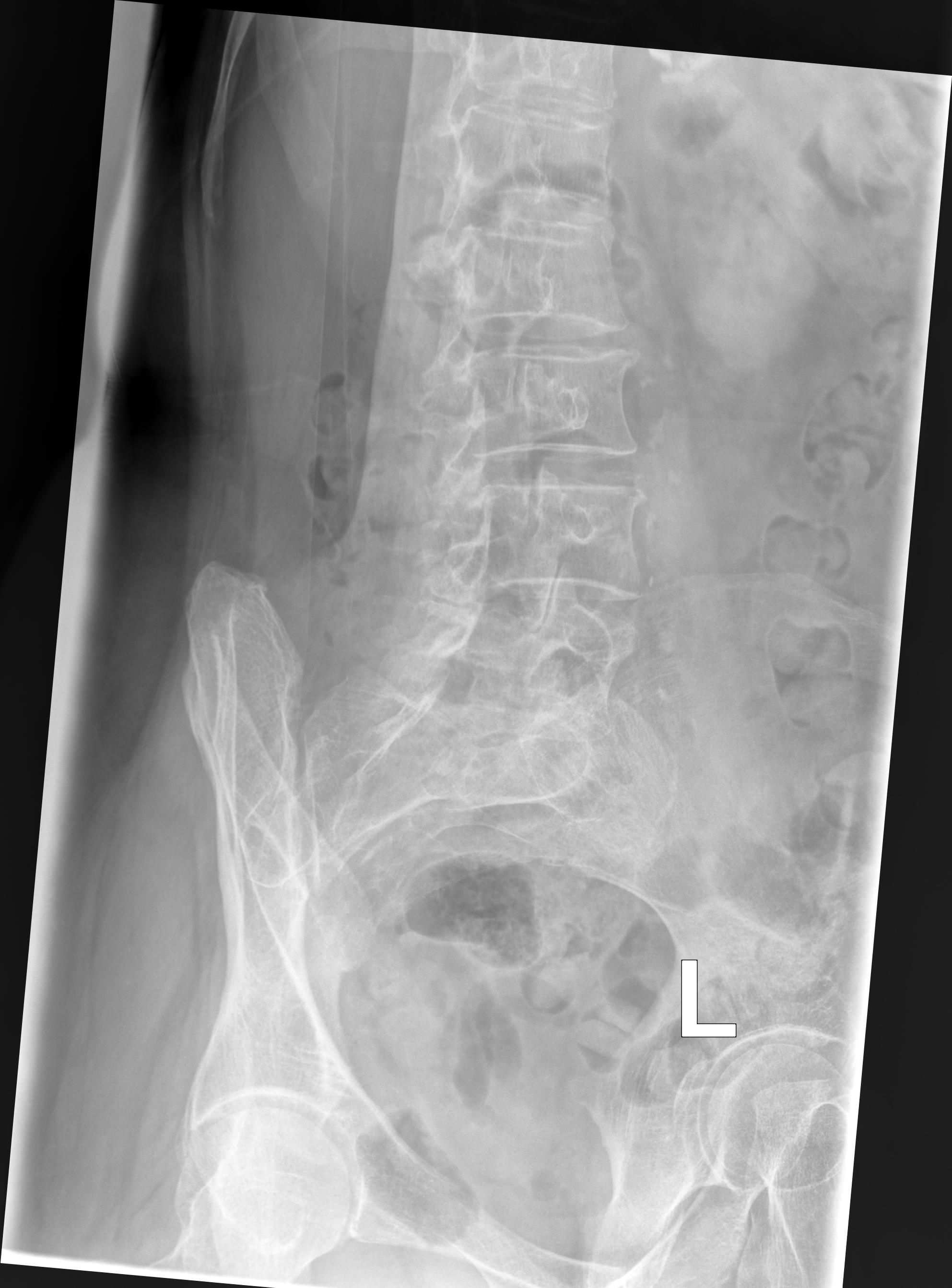

[lumbar spine lat (1 of 2)]
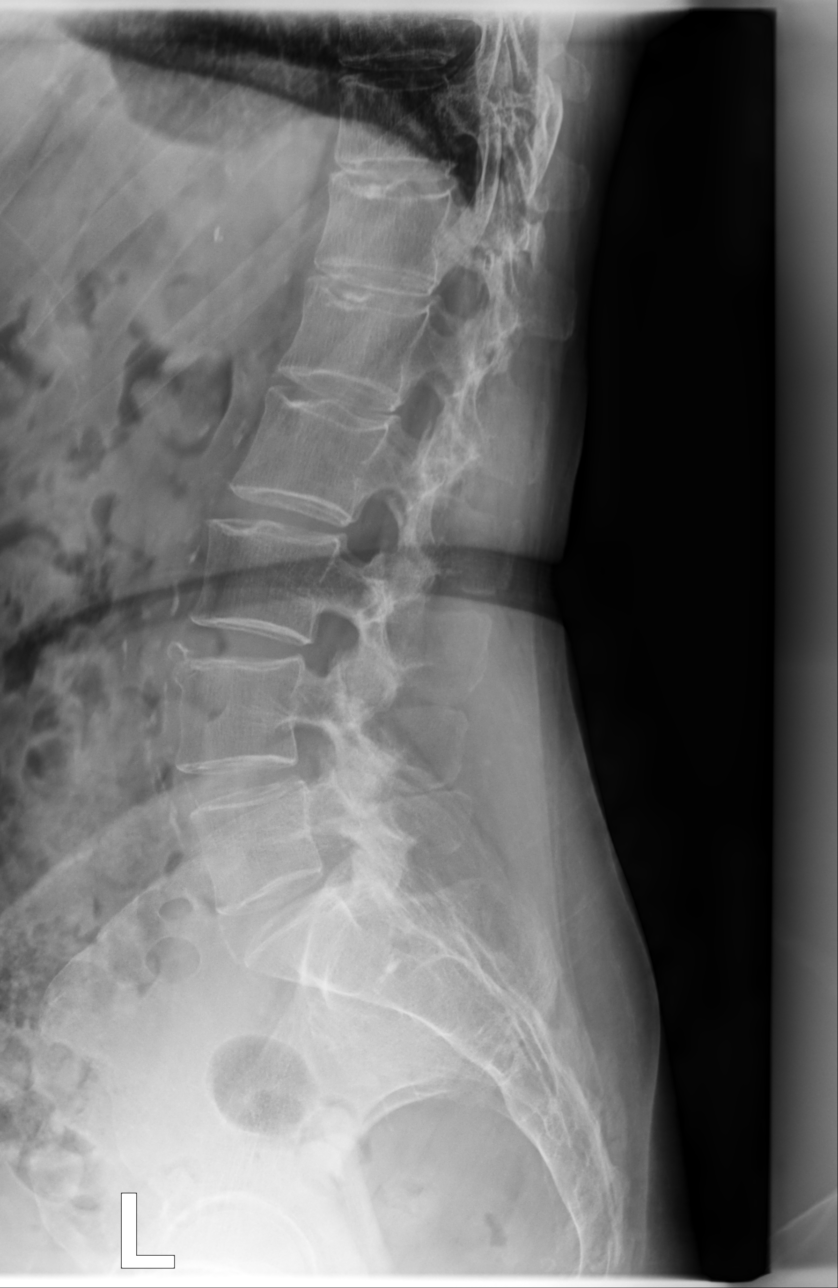

[lumbar spine lat (2 of 2)]
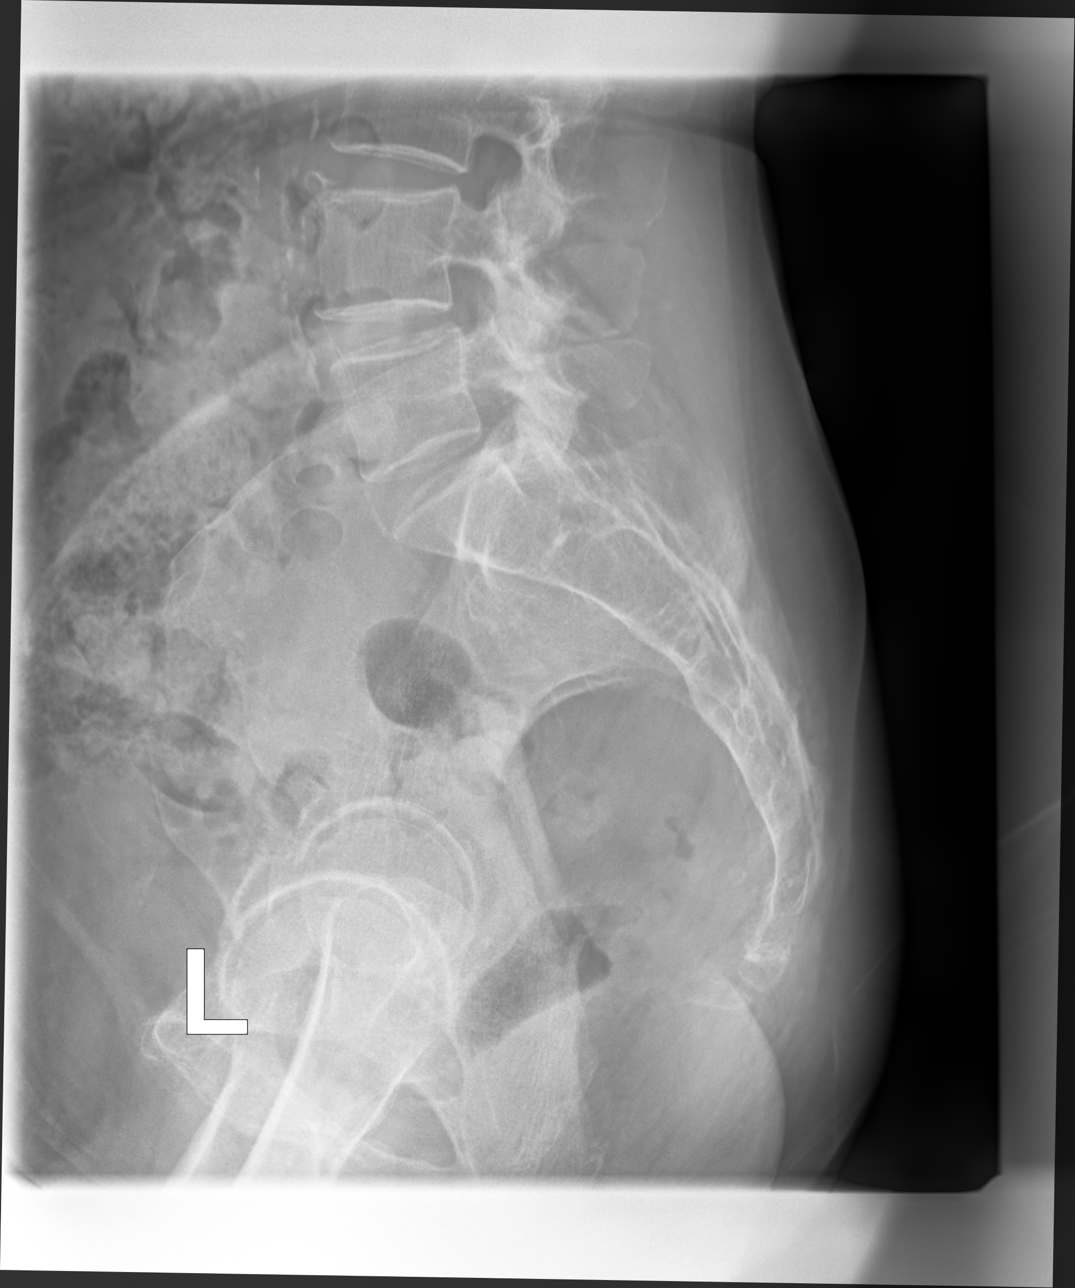

[5 of 5 positions shown; findings below may reference images not displayed]

FINDINGS: There is no evidence of lumbar spine fracture. Alignment is normal.
Intervertebral disc spaces are maintained.
IMPRESSION: Negative.

## 2022-01-09 ENCOUNTER — Ambulatory Visit (INDEPENDENT_AMBULATORY_CARE_PROVIDER_SITE_OTHER): Payer: Medicare Other | Admitting: *Deleted

## 2022-01-09 DIAGNOSIS — G43719 Chronic migraine without aura, intractable, without status migrainosus: Secondary | ICD-10-CM | POA: Diagnosis not present

## 2022-01-09 DIAGNOSIS — J309 Allergic rhinitis, unspecified: Secondary | ICD-10-CM

## 2022-01-10 ENCOUNTER — Encounter: Payer: Self-pay | Admitting: Internal Medicine

## 2022-01-10 ENCOUNTER — Ambulatory Visit (INDEPENDENT_AMBULATORY_CARE_PROVIDER_SITE_OTHER): Payer: Medicare Other

## 2022-01-10 ENCOUNTER — Ambulatory Visit (INDEPENDENT_AMBULATORY_CARE_PROVIDER_SITE_OTHER): Payer: Medicare Other | Admitting: Internal Medicine

## 2022-01-10 VITALS — BP 130/80 | HR 76 | Resp 18 | Ht 64.0 in | Wt 113.0 lb

## 2022-01-10 DIAGNOSIS — M1811 Unilateral primary osteoarthritis of first carpometacarpal joint, right hand: Secondary | ICD-10-CM

## 2022-01-10 DIAGNOSIS — D649 Anemia, unspecified: Secondary | ICD-10-CM

## 2022-01-10 DIAGNOSIS — R030 Elevated blood-pressure reading, without diagnosis of hypertension: Secondary | ICD-10-CM | POA: Diagnosis not present

## 2022-01-10 DIAGNOSIS — F411 Generalized anxiety disorder: Secondary | ICD-10-CM

## 2022-01-10 DIAGNOSIS — E785 Hyperlipidemia, unspecified: Secondary | ICD-10-CM

## 2022-01-10 DIAGNOSIS — K2100 Gastro-esophageal reflux disease with esophagitis, without bleeding: Secondary | ICD-10-CM

## 2022-01-10 DIAGNOSIS — R1013 Epigastric pain: Secondary | ICD-10-CM | POA: Diagnosis not present

## 2022-01-10 DIAGNOSIS — Z720 Tobacco use: Secondary | ICD-10-CM

## 2022-01-10 DIAGNOSIS — J449 Chronic obstructive pulmonary disease, unspecified: Secondary | ICD-10-CM

## 2022-01-10 DIAGNOSIS — R634 Abnormal weight loss: Secondary | ICD-10-CM | POA: Diagnosis not present

## 2022-01-10 DIAGNOSIS — E559 Vitamin D deficiency, unspecified: Secondary | ICD-10-CM | POA: Diagnosis not present

## 2022-01-10 DIAGNOSIS — Z0001 Encounter for general adult medical examination with abnormal findings: Secondary | ICD-10-CM

## 2022-01-10 DIAGNOSIS — E538 Deficiency of other specified B group vitamins: Secondary | ICD-10-CM | POA: Diagnosis not present

## 2022-01-10 DIAGNOSIS — R739 Hyperglycemia, unspecified: Secondary | ICD-10-CM | POA: Diagnosis not present

## 2022-01-10 DIAGNOSIS — F17210 Nicotine dependence, cigarettes, uncomplicated: Secondary | ICD-10-CM | POA: Diagnosis not present

## 2022-01-10 DIAGNOSIS — R0789 Other chest pain: Secondary | ICD-10-CM | POA: Diagnosis not present

## 2022-01-10 LAB — IBC PANEL
Iron: 82 ug/dL (ref 42–145)
Saturation Ratios: 25.2 % (ref 20.0–50.0)
TIBC: 324.8 ug/dL (ref 250.0–450.0)
Transferrin: 232 mg/dL (ref 212.0–360.0)

## 2022-01-10 LAB — URINALYSIS, ROUTINE W REFLEX MICROSCOPIC
Bilirubin Urine: NEGATIVE
Ketones, ur: NEGATIVE
Leukocytes,Ua: NEGATIVE
Nitrite: NEGATIVE
Specific Gravity, Urine: 1.01 (ref 1.000–1.030)
Total Protein, Urine: NEGATIVE
Urine Glucose: NEGATIVE
Urobilinogen, UA: 0.2 (ref 0.0–1.0)
pH: 6 (ref 5.0–8.0)

## 2022-01-10 LAB — TSH: TSH: 1.42 u[IU]/mL (ref 0.35–5.50)

## 2022-01-10 LAB — HEPATIC FUNCTION PANEL
ALT: 7 U/L (ref 0–35)
AST: 11 U/L (ref 0–37)
Albumin: 4.4 g/dL (ref 3.5–5.2)
Alkaline Phosphatase: 73 U/L (ref 39–117)
Bilirubin, Direct: 0.1 mg/dL (ref 0.0–0.3)
Total Bilirubin: 0.6 mg/dL (ref 0.2–1.2)
Total Protein: 6.7 g/dL (ref 6.0–8.3)

## 2022-01-10 LAB — BASIC METABOLIC PANEL
BUN: 6 mg/dL (ref 6–23)
CO2: 26 mEq/L (ref 19–32)
Calcium: 9.2 mg/dL (ref 8.4–10.5)
Chloride: 105 mEq/L (ref 96–112)
Creatinine, Ser: 0.84 mg/dL (ref 0.40–1.20)
GFR: 72.92 mL/min (ref >60.00)
Glucose, Bld: 98 mg/dL (ref 70–99)
Potassium: 4.1 mEq/L (ref 3.5–5.1)
Sodium: 138 mEq/L (ref 135–145)

## 2022-01-10 LAB — CBC WITH DIFFERENTIAL/PLATELET
Basophils Absolute: 0 10*3/uL (ref 0.0–0.1)
Basophils Relative: 0.2 % (ref 0.0–3.0)
Eosinophils Absolute: 0 10*3/uL (ref 0.0–0.7)
Eosinophils Relative: 0 % (ref 0.0–5.0)
HCT: 40.2 % (ref 36.0–46.0)
Hemoglobin: 13.7 g/dL (ref 12.0–15.0)
Lymphocytes Relative: 30.6 % (ref 12.0–46.0)
Lymphs Abs: 2.4 10*3/uL (ref 0.7–4.0)
MCHC: 34.1 g/dL (ref 30.0–36.0)
MCV: 93.2 fl (ref 78.0–100.0)
Monocytes Absolute: 0.4 10*3/uL (ref 0.1–1.0)
Monocytes Relative: 4.5 % (ref 3.0–12.0)
Neutro Abs: 5.1 10*3/uL (ref 1.4–7.7)
Neutrophils Relative %: 64.7 % (ref 43.0–77.0)
Platelets: 261 10*3/uL (ref 150.0–400.0)
RBC: 4.32 Mil/uL (ref 3.87–5.11)
RDW: 13.3 % (ref 11.5–15.5)
WBC: 7.9 10*3/uL (ref 4.0–10.5)

## 2022-01-10 LAB — LIPID PANEL
Cholesterol: 246 mg/dL — ABNORMAL HIGH (ref 0–200)
HDL: 41.4 mg/dL (ref >39.00)
NonHDL: 205.04
Total CHOL/HDL Ratio: 6
Triglycerides: 328 mg/dL — ABNORMAL HIGH (ref 0.0–149.0)
VLDL: 65.6 mg/dL — ABNORMAL HIGH (ref 0.0–40.0)

## 2022-01-10 LAB — FERRITIN: Ferritin: 22.5 ng/mL (ref 10.0–291.0)

## 2022-01-10 LAB — LDL CHOLESTEROL, DIRECT: Direct LDL: 165 mg/dL

## 2022-01-10 LAB — VITAMIN B12: Vitamin B-12: 245 pg/mL (ref 211–911)

## 2022-01-10 LAB — HEMOGLOBIN A1C: Hgb A1c MFr Bld: 5.3 % (ref 4.6–6.5)

## 2022-01-10 LAB — VITAMIN D 25 HYDROXY (VIT D DEFICIENCY, FRACTURES): VITD: 22.86 ng/mL — ABNORMAL LOW (ref 30.00–100.00)

## 2022-01-10 MED ORDER — TRAMADOL HCL 50 MG PO TABS: 50.0000 mg | ORAL_TABLET | Freq: Four times a day (QID) | ORAL | 0 refills | Status: DC | PRN

## 2022-01-10 NOTE — Patient Instructions (Signed)
Please take all new medication as prescribed - the tramadol for the pain ? ?Please stop smoking ? ?Please continue all other medications as before, and refills have been done if requested. ? ?Please have the pharmacy call with any other refills you may need. ? ?Please continue your efforts at being more active, low cholesterol diet, and weight control. ? ?You are otherwise up to date with prevention measures today. ? ?Please keep your appointments with your specialists as you may have planned ? ?You will be contacted regarding the referral for: CT scan for the abdominal/pelvis area ? ?Please go to the XRAY Department in the first floor for the x-ray testing ? ?Please go to the LAB at the blood drawing area for the tests to be done ? ?You will be contacted by phone if any changes need to be made immediately.  Otherwise, you will receive a letter about your results with an explanation, but please check with MyChart first. ? ?Please remember to sign up for MyChart if you have not done so, as this will be important to you in the future with finding out test results, communicating by private email, and scheduling acute appointments online when needed. ? ?.Please make an Appointment to return in 1 months, or sooner if needed ?

## 2022-01-10 NOTE — Progress Notes (Signed)
Patient ID: Lori [name], female   DOB: 04-21-1956, 66 y.o.   MRN: [id number] ? ? ? ?     Chief Complaint:: wellness exam and Abdominal Pain (She is concerned that it may be her hernia. She is also having right arm. ) ? , wt loss with early satiety, and hyprglycemia ? ?     HPI:  Lori [name] is a 66 y.o. female here for wellness exam; declines covid boostr, shingrix, mamogram,d xa, colonoscopy o/w up to date ?         ?              Also quite a bit a stress with family stressors. Denies worsening depressive symptoms, suicidal ideation, or panic; has ongoing anxiety, Also with marked recent wt loss and early satiety, also with epigastric pain without radiation, has some nausea but no vomiting, Has some reflux, and Denies other abd pain, dysphagia, bowel change or blood.  Also has 1 mo persistent right wrist pain despite recent procedure.  Pt denies chest pain, increased sob or doe, wheezing, orthopnea, PND, increased LE swelling, palpitations, dizziness or syncope.   Pt denies polydipsia, polyuria, or new focal neuro s/s.    Pt denies fever, night sweats, loss of appetite, or other constitutional symptoms  ?  ?Wt Readings from Last 3 Encounters:  ?01/10/22 113 lb (51.3 kg)  ?11/28/21 120 lb (54.4 kg)  ?11/22/21 122 lb 12.8 oz (55.7 kg)  ? ?BP Readings from Last 3 Encounters:  ?01/10/22 130/80  ?12/04/21 115/73  ?11/28/21 (!) 107/50  ? ?Immunization History  ?Administered Date(s) Administered  ? Influenza,inj,Quad PF,6+ Mos 12/03/2016, 05/18/2017, 05/20/2018, 05/25/2019  ? Influenza,inj,Quad PF,6-35 Mos 05/18/2019  ? Influenza-Unspecified 05/18/2017, 07/17/2018, 05/14/2021  ? PFIZER(Purple Top)SARS-COV-2 Vaccination 10/18/2019, 11/08/2019, 06/19/2020  ? Pneumococcal Conjugate-13 12/30/2014, 12/31/2016  ? Pneumococcal Polysaccharide-23 01/17/2014, 12/31/2017, 05/14/2021  ? Tdap 09/09/2013, 05/10/2016  ? Zoster Recombinat (Shingrix) 12/15/2016  ? Zoster, Live 08/31/2013  ? ?There are no preventive care reminders  to display for this patient. ? ?  ? ?Past Medical History:  ?Diagnosis Date  ? Allergic rhinitis   ? Anemia   ? hgb 13.9 on 11/22/21  ? Ankle fracture 05/2016  ? Anxiety   ? Arthritis   ? Asthma   ? Barrett esophagus 2007  ? Chest pain   ? Hx, no current problems as of 11/22/21.  Recurrent episodes.  Has had 3 negative nuclear stress test, and a completely normal coronary CT angiogram  ? COPD (chronic obstructive pulmonary disease) with chronic bronchitis (HCC)   ? & Emphysema  ? Depression   ? Duodenitis without mention of hemorrhage 2007  ? Esophageal reflux 2007  ? Esophageal stricture   ? Full dentures   ? patient does not wear dentures as of 11/22/21  ? H/O hiatal hernia   ? Hiatal hernia [id number]  ? HSV infection   ? Hyperlipidemia   ? rx crestor but pt not taking  ? Multiple fractures   ? from falls, fx rt. elbow, fx left wrist, bilateral ankles  ? Pneumonia 10/2016  ? Restrictive lung disease 06/30/2017  ? Ulcer   ? no current problems as of 11/22/21  ? ?Past Surgical History:  ?Procedure Laterality Date  ? ABDOMINAL HYSTERECTOMY    ? BSO  ? APPENDECTOMY    ? CESAREAN SECTION    ? x 2  ? CHOLECYSTECTOMY    ? CHONDROPLASTY Left 01/06/2015  ? Procedure: CHONDROPLASTY;  Surgeon: Marybelle Killings, MD;  Location: [facility] [place];  Service: Orthopedics;  Laterality: Left;  ? COLONOSCOPY    ? CT CTA CORONARY W/CA SCORE W/CM &/OR WO/CM  12/02/2016  ? CORONARY CALCIUM SCORE 0.  NO EVIDENCE OF CAD.  Normal-sized PA.  Normal pulmonary venous drainage the left atrium.  No ASD or VSD.  ? ELBOW FRACTURE SURGERY    ? right  ? EYE SURGERY Bilateral   ? cataracts removed  ? KNEE ARTHROSCOPY WITH EXCISION PLICA Left 32/35/5732  ? Procedure: KNEE ARTHROSCOPY WITH EXCISION PLICA;  Surgeon: Marybelle Killings, MD;  Location: [facility];  Service: Orthopedics;  Laterality: Left;  ? LIGAMENT REPAIR Right 11/28/2021  ? Procedure: RIGHT THUMB LIGAMENT RECONSTRUCTION AND TENDON INTERPOSITION;  Surgeon: Sherilyn Cooter, MD;   Location: [facility];  Service: Orthopedics;  Laterality: Right;  ? Lower Extremity Arterial Dopplers  07/06/2013  ? RABI 1.0, LABI 1.1.; No evidence of significant vascular atherogenic plaque  ? NECK SURGERY    ? NM MYOVIEW LTD  09/2014  ? a) ?LOW RISK. Normal EF of 65%, no RWMA. NO ISCHEMIA OR INFARCTION.;; b) 05/02/2009: Normal study.  LOW RISK.  No ischemia or infarction.  EF 74%.  Breast attenuation noted.  ? TONSILLECTOMY    ? TRANSTHORACIC ECHOCARDIOGRAM  03/28/2021  ? a) Normal EF 60 to 65%.  GR 1 DD.  No R WMA.  Normal RV size and function.  Normal RAP.  Normal valves. => NORMAL;; b) 04/23/16/2010: Normal study.  LOW RISK.  No ischemia or infarction.  EF 74%.  Breast attenuation noted.17/2010: ([facility]) EF>55%.  Normal wall motion.  Normal valves.  ? WRIST FRACTURE SURGERY    ? left, has plate  ? ? reports that she has been smoking cigarettes. She started smoking about 44 years ago. She has a 19.50 pack-year smoking history. She has never used smokeless tobacco. She reports that she does not drink alcohol and does not use drugs. ?family history includes Dementia in her mother; Diabetes in her maternal aunt, maternal grandmother, and another family member; Emphysema in her father; Heart disease in her brother. ?Allergies  ?Allergen Reactions  ? Bee Venom Swelling  ? Codeine Nausea Only and Other (See Comments)  ?  CAUSES ULCERS  ? Ibuprofen Nausea And Vomiting  ? Clonidine Derivatives Other (See Comments)  ?  Unknown   ? Statins   ? Tramadol Nausea Only  ? Latex Rash  ? Levofloxacin Nausea Only  ? Propoxyphene N-Acetaminophen Nausea Only  ? ?Current Outpatient Medications on File Prior to Visit  ?Medication Sig Dispense Refill  ? AIMOVIG 140 MG/ML SOAJ Inject 140 mg into the skin every 30 (thirty) days.    ? ALPRAZolam (XANAX) 1 MG tablet TAKE 1 TABLET BY MOUTH AT BEDTIME AS NEEDED FOR ANXIETY 90 tablet 1  ? Budeson-Glycopyrrol-Formoterol (BREZTRI AEROSPHERE) 160-9-4.8 MCG/ACT AERO Inhale 2 puffs once a day with spacer  to help prevent cough and wheeze. (Patient taking differently: Inhale 2 puffs into the lungs daily as needed (coughing and wheezing). Inhale 2 puffs once a day with spacer to help prevent cough and wheeze.) 10.7 g 5  ? DEXILANT 60 MG capsule Take 1 capsule (60 mg total) by mouth daily. (Patient taking differently: Take 1 capsule by mouth in the morning and at bedtime.) 90 capsule 1  ? EPINEPHrine 0.3 mg/0.3 mL IJ SOAJ injection INJECT AS DIRECTED FOR SEVERE ALLERGIC REACTION 2 each 1  ? FASENRA 30 MG/ML SOSY INJECT '30MG'$  SUBCUTANEOUSLY  EVERY 8 WEEKS 1  mL 6  ? gabapentin (NEURONTIN) 300 MG capsule Take 300 mg by mouth 2 (two) times daily.    ? meclizine (ANTIVERT) 25 MG tablet TAKE 1 TABLET BY MOUTH THREE TIMES DAILY AS NEEDED FOR DIZZINESS 40 tablet 0  ? meloxicam (MOBIC) 7.5 MG tablet Take 1 tablet (7.5 mg total) by mouth daily. 30 tablet 0  ? montelukast (SINGULAIR) 10 MG tablet Take 1 tablet (10 mg total) by mouth at bedtime. 90 tablet 2  ? nitroGLYCERIN (NITROSTAT) 0.4 MG SL tablet Place 1 tablet (0.4 mg total) under the tongue every 5 (five) minutes as needed for chest pain. 25 tablet 1  ? ondansetron (ZOFRAN) 4 MG tablet Take 1 tablet (4 mg total) by mouth every 8 (eight) hours as needed for nausea or vomiting. 20 tablet 0  ? tiZANidine (ZANAFLEX) 2 MG tablet Take 1 tablet (2 mg total) by mouth every 8 (eight) hours as needed for muscle spasms. 21 tablet 0  ? valACYclovir (VALTREX) 1000 MG tablet Take 1 tablet by mouth twice daily 180 tablet 1  ? ?Current Facility-Administered Medications on File Prior to Visit  ?Medication Dose Route Frequency Provider Last Rate Last Admin  ? Benralizumab SOSY 30 mg  30 mg Subcutaneous Q8 weeks Valentina Shaggy, MD   30 mg at 12/05/21 4967  ? ?     ROS:  All others reviewed and negative. ? ?Objective  ? ?     PE:  BP 130/80   Pulse 76   Resp 18   Ht '5\' 4"'$  (1.626 m)   Wt 113 lb (51.3 kg)   SpO2 98%   BMI 19.40 kg/m?  ? ?              Constitutional: Pt appears in  NAD ?              HENT: Head: NCAT.  ?              Right Ear: External ear normal.   ?              Left Ear: External ear normal.  ?              Eyes: . Pupils are equal, round, and reactive to light. Co

## 2022-01-11 ENCOUNTER — Encounter: Payer: Self-pay | Admitting: Internal Medicine

## 2022-01-11 ENCOUNTER — Telehealth: Payer: Self-pay | Admitting: Internal Medicine

## 2022-01-11 NOTE — Telephone Encounter (Signed)
Discussed lab results with patient. Patient verbalizes understanding ?

## 2022-01-11 NOTE — Telephone Encounter (Signed)
Pt called in and is requesting a call back from assistant to discuss her last lab results.  ? ?Please call ASAP.  ?

## 2022-01-13 NOTE — Assessment & Plan Note (Signed)
With recent overt wt loss - for CT abd/pelvis ?

## 2022-01-13 NOTE — Assessment & Plan Note (Signed)
Lab Results  ?Component Value Date  ? HGBA1C 5.3 01/10/2022  ? ?Stable, pt to continue current medical treatment  - diet ? ?

## 2022-01-13 NOTE — Assessment & Plan Note (Signed)
Age and sex appropriate education and counseling updated with regular exercise and diet ?Referrals for preventative services - declines mammogram, dxa, colonoscopy ?Immunizations addressed - declines shingrix, covid booster ?Smoking counseling  - pt counsled to quit, pt not ready ?Evidence for depression or other mood disorder - chronia anxiety depression - delcines need for change in tx or referral ?Most recent labs reviewed. ?I have personally reviewed and have noted: ?1) the patient's medical and social history ?2) The patient's current medications and supplements ?3) The patient's height, weight, and BMI have been recorded in the chart ? ?

## 2022-01-13 NOTE — Assessment & Plan Note (Signed)
For PPI restart -  to f/u any worsening symptoms or concerns ?

## 2022-01-13 NOTE — Assessment & Plan Note (Signed)
Declines need for change in tx at this time ?

## 2022-01-13 NOTE — Assessment & Plan Note (Signed)
Pt counseled to quit, pt not ready 

## 2022-01-13 NOTE — Assessment & Plan Note (Addendum)
Etiology unclear, ? Psychiatric vs GI related - for eval as above, also CXR ?

## 2022-01-13 NOTE — Assessment & Plan Note (Signed)
Severe, with persistent pain, for tramadol prn ?

## 2022-01-13 NOTE — Assessment & Plan Note (Signed)
Lab Results  ?Component Value Date  ? [place] Comment 03/12/2017  ? ?Stable, pt to continue current slow chol diet ? ?

## 2022-01-13 NOTE — Assessment & Plan Note (Signed)
BP Readings from Last 3 Encounters:  ?01/10/22 130/80  ?12/04/21 115/73  ?11/28/21 (!) 107/50  ? ?Stable, pt to continue medical treatment  - diet, wt control ? ?

## 2022-01-13 NOTE — Assessment & Plan Note (Signed)
Last vitamin D ?Lab Results  ?Component Value Date  ? VD25OH 22.86 (L) 01/10/2022  ? ?Low to start oral replacement ? ?

## 2022-01-13 NOTE — Assessment & Plan Note (Signed)
Lab Results  ?Component Value Date  ? SHFWYOVZ85 245 01/10/2022  ? ?Low to start oral replacement - b12 1000 mcg qd ? ?

## 2022-01-13 NOTE — Assessment & Plan Note (Signed)
Stable today, continue inhaler prn ?

## 2022-01-14 ENCOUNTER — Encounter: Payer: Medicaid Other | Admitting: Orthopedic Surgery

## 2022-01-17 ENCOUNTER — Ambulatory Visit (INDEPENDENT_AMBULATORY_CARE_PROVIDER_SITE_OTHER): Payer: Medicare Other

## 2022-01-17 ENCOUNTER — Ambulatory Visit
Admission: RE | Admit: 2022-01-17 | Discharge: 2022-01-17 | Disposition: A | Discharge status: Home / Self Care | Payer: Medicare Other | Source: Ambulatory Visit | Attending: Internal Medicine | Admitting: Internal Medicine

## 2022-01-17 DIAGNOSIS — K838 Other specified diseases of biliary tract: Secondary | ICD-10-CM | POA: Diagnosis not present

## 2022-01-17 DIAGNOSIS — R1013 Epigastric pain: Secondary | ICD-10-CM | POA: Diagnosis not present

## 2022-01-17 DIAGNOSIS — J309 Allergic rhinitis, unspecified: Secondary | ICD-10-CM

## 2022-01-17 DIAGNOSIS — J439 Emphysema, unspecified: Secondary | ICD-10-CM | POA: Diagnosis not present

## 2022-01-17 DIAGNOSIS — I700 Atherosclerosis of aorta: Secondary | ICD-10-CM | POA: Diagnosis not present

## 2022-01-17 DIAGNOSIS — R634 Abnormal weight loss: Secondary | ICD-10-CM

## 2022-01-17 MED ORDER — IOPAMIDOL (ISOVUE-300) INJECTION 61%
100.0000 mL | Freq: Once | INTRAVENOUS | Status: AC | PRN
  Administered 2022-01-17: 100 mL via INTRAVENOUS

## 2022-01-18 ENCOUNTER — Telehealth: Payer: Self-pay | Admitting: Internal Medicine

## 2022-01-18 NOTE — Telephone Encounter (Signed)
Pt requesting a call back to discuss results from CT.  ? ?

## 2022-01-21 ENCOUNTER — Telehealth: Payer: Self-pay | Admitting: Internal Medicine

## 2022-01-21 DIAGNOSIS — R634 Abnormal weight loss: Secondary | ICD-10-CM

## 2022-01-21 DIAGNOSIS — K839 Disease of biliary tract, unspecified: Secondary | ICD-10-CM

## 2022-01-21 DIAGNOSIS — R935 Abnormal findings on diagnostic imaging of other abdominal regions, including retroperitoneum: Secondary | ICD-10-CM

## 2022-01-21 MED ORDER — TRAMADOL HCL 50 MG PO TABS: 50.0000 mg | ORAL_TABLET | Freq: Four times a day (QID) | ORAL | 1 refills | Status: DC | PRN

## 2022-01-21 NOTE — Telephone Encounter (Signed)
Spoke to pt regarding CT results ? ?IMPRESSION: ?1. Patient is status post cholecystectomy. There is intra and ?extrahepatic biliary ductal dilatation which is new/increased from ?prior examination. Findings are indeterminate. Distal biliary ?obstruction not excluded. Recommend correlation with lab values. ?This can be further evaluated with MRCP or ERCP. ?2. Aortic Atherosclerosis (ICD10-I70.0) and Emphysema (ICD10-J43.9). ? ?This seems high suspicion for possible biliary tract disease such as malignancy in light of recent issue such as wt loss and pain. ? ?Pt agrees to referral urgent to Dr Antony Madura GI for further consideration ?

## 2022-01-21 NOTE — Telephone Encounter (Signed)
Pt called in requesting call back regarding results from CT ?

## 2022-01-22 ENCOUNTER — Ambulatory Visit: Payer: Medicare Other | Admitting: Family Medicine

## 2022-01-22 ENCOUNTER — Telehealth: Payer: Self-pay | Admitting: Gastroenterology

## 2022-01-22 DIAGNOSIS — R1011 Right upper quadrant pain: Secondary | ICD-10-CM

## 2022-01-22 DIAGNOSIS — K838 Other specified diseases of biliary tract: Secondary | ICD-10-CM

## 2022-01-22 NOTE — Telephone Encounter (Signed)
Patty, ?I have reviewed the patient's recent outpatient [facility] clinic note as well as the CT scan.  There is marked duct dilation.  Thankfully her LFTs are normal. ?It would be ideal if the patient can tolerate an MRI/MRCP to better define things but she also needs to be scheduled for an endoscopic ultrasound. ?She is a patient of Dr. Corena Pilgrim. ?Please expedite an MRI/MRCP for biliary duct dilation and abdominal pain. ?Please schedule an EUS with DJ or myself and if the MRI/MRCP shows evidence of a stone then she may require an ERCP but for now just EUS. ?If her LFTs start to bump then she may require an ERCP but with them being normal yesterday no ERCP for now.   ?Thanks. ?GM ? ?Tomma Rakers, HD, JJ ?

## 2022-01-22 NOTE — Addendum Note (Signed)
Addended by: Biagio Borg on: 01/22/2022 05:57 PM ? ? Modules accepted: Orders ? ?

## 2022-01-22 NOTE — Telephone Encounter (Signed)
Dr Jenny Reichmann see the request for expedited MRI/MRCP for biliary duct dilation and abdominal pain. I will work on EUS appt.   ?

## 2022-01-22 NOTE — Telephone Encounter (Signed)
[place] MR with MRCP with CM has been ordered ? ?Lori [name] please inform pt she should expect a call hopefully very soon about this new test she has not yet had, requested by GI prior to actually seeing them, so they know more when she gets there ?

## 2022-01-23 ENCOUNTER — Telehealth: Payer: Self-pay | Admitting: Internal Medicine

## 2022-01-23 ENCOUNTER — Ambulatory Visit (INDEPENDENT_AMBULATORY_CARE_PROVIDER_SITE_OTHER): Payer: Medicare Other | Admitting: Family Medicine

## 2022-01-23 ENCOUNTER — Encounter: Payer: Self-pay | Admitting: Family Medicine

## 2022-01-23 ENCOUNTER — Ambulatory Visit (INDEPENDENT_AMBULATORY_CARE_PROVIDER_SITE_OTHER): Payer: Medicare Other

## 2022-01-23 ENCOUNTER — Other Ambulatory Visit: Payer: Self-pay

## 2022-01-23 VITALS — BP 128/84 | HR 79 | Temp 97.6°F | Ht 64.0 in | Wt 108.6 lb

## 2022-01-23 DIAGNOSIS — J309 Allergic rhinitis, unspecified: Secondary | ICD-10-CM

## 2022-01-23 DIAGNOSIS — R059 Cough, unspecified: Secondary | ICD-10-CM | POA: Diagnosis not present

## 2022-01-23 DIAGNOSIS — J441 Chronic obstructive pulmonary disease with (acute) exacerbation: Secondary | ICD-10-CM

## 2022-01-23 DIAGNOSIS — R0602 Shortness of breath: Secondary | ICD-10-CM

## 2022-01-23 DIAGNOSIS — R1011 Right upper quadrant pain: Secondary | ICD-10-CM

## 2022-01-23 DIAGNOSIS — R058 Other specified cough: Secondary | ICD-10-CM

## 2022-01-23 DIAGNOSIS — K838 Other specified diseases of biliary tract: Secondary | ICD-10-CM

## 2022-01-23 MED ORDER — PREDNISONE 10 MG (21) PO TBPK: ORAL_TABLET | Freq: Every day | ORAL | 0 refills | Status: DC

## 2022-01-23 MED ORDER — AMOXICILLIN-POT CLAVULANATE 875-125 MG PO TABS: 1.0000 | ORAL_TABLET | Freq: Two times a day (BID) | ORAL | 0 refills | Status: DC

## 2022-01-23 MED ORDER — BENZONATATE 200 MG PO CAPS: 200.0000 mg | ORAL_CAPSULE | Freq: Two times a day (BID) | ORAL | 0 refills | Status: DC | PRN

## 2022-01-23 NOTE — Patient Instructions (Signed)
Please go downstairs before you leave for chest x-ray ? ?Take the antibiotic and steroid as prescribed. ? ?Continue your current medications for COPD/asthma and allergies ? ?If you get significantly worse then you will need to go to the emergency department for further evaluation and treatment. ?

## 2022-01-23 NOTE — Telephone Encounter (Signed)
Thank you for the help with my patient, Lori [name]. ? ?-HD ?

## 2022-01-23 NOTE — Telephone Encounter (Signed)
The pt has been scheduled for EUS with GM on 04/04/22 at 9 am at [facility] ?Left message on machine to call back  ?

## 2022-01-23 NOTE — Progress Notes (Signed)
?Subjective: ? Lori [name] is a 66 y.o. female who presents for respiratory illness.   ?Symptoms include a  3-4 week history of a cough productive of thick yellowish sputum and chest congestion.  She notes worsening shortness of breath.  ?Denies  fever, chills, dizziness, chest pain, palpitations, shortness of breath,  . ?Using Robitussin for symptoms. ? ?She is not on oxygen at home.  ? ?Past history is significant for COPD, asthma, seasonal allergies and [facility]    Patient is a smoker. ?No other aggravating or relieving factors.  No other c/o. ? ?Past Medical History:  ?Diagnosis Date  ? Allergic rhinitis   ? Anemia   ? hgb 13.9 on 11/22/21  ? Ankle fracture 05/2016  ? Anxiety   ? Arthritis   ? Asthma   ? Barrett esophagus 2007  ? Chest pain   ? Hx, no current problems as of 11/22/21.  Recurrent episodes.  Has had 3 negative nuclear stress test, and a completely normal coronary CT angiogram  ? COPD (chronic obstructive pulmonary disease) with chronic bronchitis (HCC)   ? & Emphysema  ? Depression   ? Duodenitis without mention of hemorrhage 2007  ? Esophageal reflux 2007  ? Esophageal stricture   ? Full dentures   ? patient does not wear dentures as of 11/22/21  ? H/O hiatal hernia   ? Hiatal hernia [id number]  ? HSV infection   ? Hyperlipidemia   ? rx crestor but pt not taking  ? Multiple fractures   ? from falls, fx rt. elbow, fx left wrist, bilateral ankles  ? Pneumonia 10/2016  ? Restrictive lung disease 06/30/2017  ? Ulcer   ? no current problems as of 11/22/21  ? ? ?ROS as in subjective ? ? ?Objective: ?BP 128/84 (BP Location: Left Arm, Patient Position: Sitting, Cuff Size: Normal)   Pulse 79   Temp 97.6 ?F (36.4 ?C) (Temporal)   Ht '5\' 4"'$  (1.626 m)   Wt 108 lb 9.6 oz (49.3 kg)   SpO2 93%   BMI 18.64 kg/m?  ? ?General appearance: Alert, WD/WN, no distress, mildly ill appearing ?                            Skin: warm, dry ?                          ?                          Heart: RRR ?                        Lungs: lungs with wheezing and scattered rhonchi, diminished lung sounds in the bases  ?Normal work of breathing, speaking in complete sentences without difficulty.  ?Extremities: without edema ? ?    ?Assessment  ?Encounter Diagnoses  ?Name Primary?  ? COPD exacerbation ([place]) Yes  ? Shortness of breath   ? Productive cough   ? ? ?  ?Plan: ?Chest X ray ordered.  ?Medications prescribed today: ?Augmentin, prednisone and Tessalon Perles.  ?Discussed continuing all current medications at home.  ?She is not on oxygen.  ?Advised that if she is getting significantly worse or if she develops dizziness, chest pain, palpitations, or becomes more short of breath that she will need to go to  the ED or call 911 for further evaluation and treatment.  ? ? ?Patient and family member voiced understanding of diagnosis, recommendations, and treatment plan. ? ?After visit summary given.  ? ?

## 2022-01-23 NOTE — Telephone Encounter (Signed)
This note is to document a hallway conversation today ? ?Approx 11 00 am, Lori [name] presented with her daughter to my personal office space when I was between scheduled patients for an urgent non scheduled conversation ? ?I explained to the patient and daughter in person about the reason (mostly wt loss and abd pain) for the CT scan, then the specific results (primarily the dilated biliary system).  Since the reason for this was not clear from the CT, I had referred her to Dr Melvyn Neth GI, who suggested an urgent MR MRCP which I ordered last evening.  Daughter states she understands, will leave her phone number on the system to be available for calls.  I said she could expect a call about the MR MRCP, then to see I think Dr Loletha Carrow and/or Dr Melvyn Neth.  All agreed with the plan, I answered all questions and suggested biliary obstruction may be present and due to possible stone or malignancy or other.   ?

## 2022-01-23 NOTE — Telephone Encounter (Signed)
Patient notified

## 2022-01-24 ENCOUNTER — Encounter: Payer: Self-pay | Admitting: Internal Medicine

## 2022-01-24 ENCOUNTER — Ambulatory Visit
Admission: RE | Admit: 2022-01-24 | Discharge: 2022-01-24 | Disposition: A | Discharge status: Home / Self Care | Payer: Medicare Other | Source: Ambulatory Visit | Attending: Internal Medicine | Admitting: Internal Medicine

## 2022-01-24 DIAGNOSIS — R1011 Right upper quadrant pain: Secondary | ICD-10-CM

## 2022-01-24 DIAGNOSIS — K838 Other specified diseases of biliary tract: Secondary | ICD-10-CM

## 2022-01-24 DIAGNOSIS — R109 Unspecified abdominal pain: Secondary | ICD-10-CM | POA: Diagnosis not present

## 2022-01-24 MED ORDER — GADOBENATE DIMEGLUMINE 529 MG/ML IV SOLN
9.0000 mL | Freq: Once | INTRAVENOUS | Status: AC | PRN
  Administered 2022-01-24: 9 mL via INTRAVENOUS

## 2022-01-24 NOTE — Telephone Encounter (Signed)
Left message on machine to call back  

## 2022-01-24 NOTE — Progress Notes (Signed)
No sign of pneumonia on chest XR. I recommend she continue the treatment plan we discussed.

## 2022-01-25 ENCOUNTER — Telehealth: Payer: Self-pay | Admitting: Gastroenterology

## 2022-01-25 NOTE — Telephone Encounter (Signed)
Unable to reach the pt on her home number.  I have called her daughter Lattie Haw and asked if she could please have her return my call.   ?

## 2022-01-25 NOTE — Telephone Encounter (Signed)
EUS scheduled, pt instructed and medications reviewed.  Patient instructions mailed to home.  Patient to call with any questions or concerns.  

## 2022-01-25 NOTE — Telephone Encounter (Signed)
Patient returned your call, please advise. 

## 2022-01-25 NOTE — Telephone Encounter (Signed)
See 5/12 phone note  ?

## 2022-01-28 ENCOUNTER — Ambulatory Visit (INDEPENDENT_AMBULATORY_CARE_PROVIDER_SITE_OTHER): Payer: Medicare Other

## 2022-01-28 DIAGNOSIS — J309 Allergic rhinitis, unspecified: Secondary | ICD-10-CM

## 2022-01-30 ENCOUNTER — Telehealth: Payer: Self-pay | Admitting: Internal Medicine

## 2022-01-30 ENCOUNTER — Ambulatory Visit (INDEPENDENT_AMBULATORY_CARE_PROVIDER_SITE_OTHER): Payer: Medicare Other

## 2022-01-30 DIAGNOSIS — R058 Other specified cough: Secondary | ICD-10-CM

## 2022-01-30 DIAGNOSIS — J4550 Severe persistent asthma, uncomplicated: Secondary | ICD-10-CM | POA: Diagnosis not present

## 2022-01-30 NOTE — Telephone Encounter (Signed)
Pt called in and states insurance will not cover cost of cough medicine.  ? ?Requesting that an alternative be called in that insurance will cover.  ? ? ?

## 2022-01-31 ENCOUNTER — Telehealth: Payer: Self-pay | Admitting: Internal Medicine

## 2022-01-31 MED ORDER — HYDROCODONE BIT-HOMATROP MBR 5-1.5 MG/5ML PO SOLN: 5.0000 mL | Freq: Four times a day (QID) | ORAL | 0 refills | Status: AC | PRN

## 2022-01-31 NOTE — Telephone Encounter (Signed)
I am not able to tell what her insurance covers  The only med I seem curretnly on the list is tessalon perle  I sent hyocan prn - done erx

## 2022-01-31 NOTE — Telephone Encounter (Signed)
Patient states that cough medicine Dr. Jenny Reichmann prescribed is not covered by her insurance and will cost her $30.00.  She would like something else sent in - [facility] on [address]

## 2022-01-31 NOTE — Telephone Encounter (Signed)
Patient notified via voicemail.

## 2022-02-01 MED ORDER — GUAIFENESIN-DM 100-10 MG/5ML PO SYRP: 5.0000 mL | ORAL_SOLUTION | ORAL | 0 refills | Status: DC | PRN

## 2022-02-01 NOTE — Telephone Encounter (Signed)
Ok I sent the robitussin DM-

## 2022-02-01 NOTE — Telephone Encounter (Signed)
Patient notified

## 2022-02-03 ENCOUNTER — Other Ambulatory Visit: Payer: Self-pay | Admitting: Internal Medicine

## 2022-02-04 ENCOUNTER — Telehealth: Payer: Self-pay | Admitting: Internal Medicine

## 2022-02-04 NOTE — Telephone Encounter (Signed)
1.Medication Requested: ALPRAZolam (XANAX) 1 MG tablet 2. Pharmacy (Name, Street, [place]): [facility], [place] [address] Phone:  [phone]  Fax:  [phone]     3. On Med List: yes  4. Last Visit with PCP:  5. Next visit date with PCP:   Agent: Please be advised that RX refills may take up to 3 business days. We ask that you follow-up with your pharmacy.

## 2022-02-05 NOTE — Telephone Encounter (Signed)
Called pt inform check with pharmacist rx was f=sent yesterday,,,/lmb

## 2022-02-05 NOTE — Telephone Encounter (Signed)
No already just done may 22

## 2022-02-06 ENCOUNTER — Ambulatory Visit (INDEPENDENT_AMBULATORY_CARE_PROVIDER_SITE_OTHER): Payer: Medicare Other

## 2022-02-06 DIAGNOSIS — G43719 Chronic migraine without aura, intractable, without status migrainosus: Secondary | ICD-10-CM | POA: Diagnosis not present

## 2022-02-06 DIAGNOSIS — J309 Allergic rhinitis, unspecified: Secondary | ICD-10-CM | POA: Diagnosis not present

## 2022-02-12 ENCOUNTER — Encounter: Payer: Self-pay | Admitting: Internal Medicine

## 2022-02-12 ENCOUNTER — Ambulatory Visit (INDEPENDENT_AMBULATORY_CARE_PROVIDER_SITE_OTHER): Payer: Medicare Other

## 2022-02-12 ENCOUNTER — Ambulatory Visit (INDEPENDENT_AMBULATORY_CARE_PROVIDER_SITE_OTHER): Payer: Medicare Other | Admitting: Internal Medicine

## 2022-02-12 VITALS — BP 122/60 | HR 68 | Temp 98.7°F | Ht 64.0 in | Wt 110.0 lb

## 2022-02-12 DIAGNOSIS — F17210 Nicotine dependence, cigarettes, uncomplicated: Secondary | ICD-10-CM

## 2022-02-12 DIAGNOSIS — F411 Generalized anxiety disorder: Secondary | ICD-10-CM

## 2022-02-12 DIAGNOSIS — M5441 Lumbago with sciatica, right side: Secondary | ICD-10-CM

## 2022-02-12 DIAGNOSIS — G8929 Other chronic pain: Secondary | ICD-10-CM

## 2022-02-12 DIAGNOSIS — J309 Allergic rhinitis, unspecified: Secondary | ICD-10-CM

## 2022-02-12 DIAGNOSIS — E559 Vitamin D deficiency, unspecified: Secondary | ICD-10-CM | POA: Diagnosis not present

## 2022-02-12 MED ORDER — PAROXETINE HCL 10 MG PO TABS: 10.0000 mg | ORAL_TABLET | Freq: Every day | ORAL | 3 refills | Status: DC

## 2022-02-12 MED ORDER — TRAMADOL HCL 50 MG PO TABS: 50.0000 mg | ORAL_TABLET | Freq: Four times a day (QID) | ORAL | 2 refills | Status: DC | PRN

## 2022-02-12 NOTE — Patient Instructions (Signed)
Please take OTC Vitamin D3 at 2000 units per day, indefinitely  Please take all new medication as prescribed - the paxil at 10 mg per day  Please continue all other medications as before, and refills have been done if requested - tramadol  Please have the pharmacy call with any other refills you may need.  Please continue your efforts at being more active, low cholesterol diet, and weight control.  You are otherwise up to date with prevention measures today.  Please keep your appointments with your specialists as you may have planned - the EGD for July 2023  Please make an Appointment to return in 4 months, or sooner if needed

## 2022-02-12 NOTE — Assessment & Plan Note (Signed)
Last vitamin D Lab Results  Component Value Date   VD25OH 22.86 (L) 01/10/2022   Low, to start oral replacement d

## 2022-02-12 NOTE — Progress Notes (Unsigned)
Patient ID: Lori [name], female   DOB: [birth year], 66 y.o.   MRN: [id number]        Chief Complaint: follow up recent wt loss, abnormal imaging, anxiety and chronic pain       HPI:  Lori [name] is a 66 y.o. female here with f/u recent wt loss with abnormal CT, but fortunately MRCP did not show any acute mass or other significant abnormal, and wt seems to have stabilized.  Denies worsening depressive symptoms, suicidal ideation, or panic; has ongoing anxiety, much increased recently, asks for increased med tx, as benzo prn not working well enough.  Also has ongoing chronic pain primarily lower back - Pt continues to have recurring LBP without change in severity, bowel or bladder change, fever, wt loss,  worsening LE pain/numbness/weakness, gait change or falls.  Still smoking, not ready to quit.  Pt denies chest pain, increased sob or doe, wheezing, orthopnea, PND, increased LE swelling, palpitations, or syncope, though does have intermittent dizziness without falls.  Not taking vit d.    Has EGD with Dr Lilia Argue for July 2023.         Wt Readings from Last 3 Encounters:  02/12/22 110 lb (49.9 kg)  01/23/22 108 lb 9.6 oz (49.3 kg)  01/10/22 113 lb (51.3 kg)   BP Readings from Last 3 Encounters:  02/12/22 122/60  01/23/22 128/84  01/10/22 130/80         Past Medical History:  Diagnosis Date   Allergic rhinitis    Anemia    hgb 13.9 on 11/22/21   Ankle fracture 05/2016   Anxiety    Arthritis    Asthma    Barrett esophagus 2007   Chest pain    Hx, no current problems as of 11/22/21.  Recurrent episodes.  Has had 3 negative nuclear stress test, and a completely normal coronary CT angiogram   COPD (chronic obstructive pulmonary disease) with chronic bronchitis (HCC)    & Emphysema   Depression    Duodenitis without mention of hemorrhage 2007   Esophageal reflux 2007   Esophageal stricture    Full dentures    patient does not wear dentures as of 11/22/21   H/O hiatal hernia     Hiatal hernia 2006,2007   HSV infection    Hyperlipidemia    rx crestor but pt not taking   Multiple fractures    from falls, fx rt. elbow, fx left wrist, bilateral ankles   Pneumonia 10/2016   Restrictive lung disease 06/30/2017   Ulcer    no current problems as of 11/22/21   Past Surgical History:  Procedure Laterality Date   ABDOMINAL HYSTERECTOMY     BSO   APPENDECTOMY     CESAREAN SECTION     x 2   CHOLECYSTECTOMY     CHONDROPLASTY Left 01/06/2015   Procedure: CHONDROPLASTY;  Surgeon: Marybelle Killings, MD;  Location: [facility];  Service: Orthopedics;  Laterality: Left;   COLONOSCOPY     CT CTA CORONARY W/CA SCORE W/CM &/OR WO/CM  12/02/2016   CORONARY CALCIUM SCORE 0.  NO EVIDENCE OF CAD.  Normal-sized PA.  Normal pulmonary venous drainage the left atrium.  No ASD or VSD.   ELBOW FRACTURE SURGERY     right   EYE SURGERY Bilateral    cataracts removed   KNEE ARTHROSCOPY WITH EXCISION PLICA Left 22/29/7989   Procedure: KNEE ARTHROSCOPY WITH EXCISION PLICA;  Surgeon: Marybelle Killings, MD;  Location: [facility]  [place];  Service: Orthopedics;  Laterality: Left;   LIGAMENT REPAIR Right 11/28/2021   Procedure: RIGHT THUMB LIGAMENT RECONSTRUCTION AND TENDON INTERPOSITION;  Surgeon: Sherilyn Cooter, MD;  Location: [facility];  Service: Orthopedics;  Laterality: Right;   Lower Extremity Arterial Dopplers  07/06/2013   RABI 1.0, LABI 1.1.; No evidence of significant vascular atherogenic plaque   NECK SURGERY     NM MYOVIEW LTD  09/2014   a) LOW RISK. Normal EF of 65%, no RWMA. NO ISCHEMIA OR INFARCTION.;; b) 05/02/2009: Normal study.  LOW RISK.  No ischemia or infarction.  EF 74%.  Breast attenuation noted.   TONSILLECTOMY     TRANSTHORACIC ECHOCARDIOGRAM  03/28/2021   a) Normal EF 60 to 65%.  GR 1 DD.  No R WMA.  Normal RV size and function.  Normal RAP.  Normal valves. => NORMAL;; b) 04/23/16/2010: Normal study.  LOW RISK.  No ischemia or infarction.  EF 74%.  Breast  attenuation noted.17/2010: ([facility]) EF>55%.  Normal wall motion.  Normal valves.   WRIST FRACTURE SURGERY     left, has plate    reports that she has been smoking cigarettes. She started smoking about 44 years ago. She has a 19.50 pack-year smoking history. She has never used smokeless tobacco. She reports that she does not drink alcohol and does not use drugs. family history includes Dementia in her mother; Diabetes in her maternal aunt, maternal grandmother, and another family member; Emphysema in her father; Heart disease in her brother. Allergies  Allergen Reactions   Bee Venom Swelling   Codeine Nausea Only and Other (See Comments)    CAUSES ULCERS   Ibuprofen Nausea And Vomiting   Clonidine Derivatives Other (See Comments)    Unknown    Statins    Tramadol Nausea Only   Latex Rash   Levofloxacin Nausea Only   Propoxyphene N-Acetaminophen Nausea Only   Current Outpatient Medications on File Prior to Visit  Medication Sig Dispense Refill   AIMOVIG 140 MG/ML SOAJ Inject 140 mg into the skin every 30 (thirty) days.     ALPRAZolam (XANAX) 1 MG tablet TAKE 1 TABLET BY MOUTH AT BEDTIME AS NEEDED FOR ANXIETY 90 tablet 1   benzonatate (TESSALON) 200 MG capsule Take 1 capsule (200 mg total) by mouth 2 (two) times daily as needed for cough. 20 capsule 0   Budeson-Glycopyrrol-Formoterol (BREZTRI AEROSPHERE) 160-9-4.8 MCG/ACT AERO Inhale 2 puffs once a day with spacer to help prevent cough and wheeze. (Patient taking differently: Inhale 2 puffs into the lungs daily as needed (coughing and wheezing). Inhale 2 puffs once a day with spacer to help prevent cough and wheeze.) 10.7 g 5   DEXILANT 60 MG capsule Take 1 capsule (60 mg total) by mouth daily. (Patient taking differently: Take 1 capsule by mouth in the morning and at bedtime.) 90 capsule 1   EPINEPHrine 0.3 mg/0.3 mL IJ SOAJ injection INJECT AS DIRECTED FOR SEVERE ALLERGIC REACTION 2 each 1   FASENRA 30 MG/ML SOSY INJECT '30MG'$  SUBCUTANEOUSLY   EVERY 8 WEEKS 1 mL 6   gabapentin (NEURONTIN) 300 MG capsule Take 300 mg by mouth 2 (two) times daily.     guaiFENesin-dextromethorphan (ROBITUSSIN DM) 100-10 MG/5ML syrup Take 5 mLs by mouth every 4 (four) hours as needed for cough. 118 mL 0   meclizine (ANTIVERT) 25 MG tablet TAKE 1 TABLET BY MOUTH THREE TIMES DAILY AS NEEDED FOR DIZZINESS 40 tablet 0   montelukast (SINGULAIR) 10 MG tablet Take 1  tablet (10 mg total) by mouth at bedtime. 90 tablet 2   nitroGLYCERIN (NITROSTAT) 0.4 MG SL tablet Place 1 tablet (0.4 mg total) under the tongue every 5 (five) minutes as needed for chest pain. 25 tablet 1   ondansetron (ZOFRAN) 4 MG tablet Take 1 tablet (4 mg total) by mouth every 8 (eight) hours as needed for nausea or vomiting. 20 tablet 0   predniSONE (STERAPRED UNI-PAK 21 TAB) 10 MG (21) TBPK tablet Take by mouth daily. 21 tablet 0   tiZANidine (ZANAFLEX) 2 MG tablet Take 1 tablet (2 mg total) by mouth every 8 (eight) hours as needed for muscle spasms. 21 tablet 0   valACYclovir (VALTREX) 1000 MG tablet Take 1 tablet by mouth twice daily 180 tablet 1   Current Facility-Administered Medications on File Prior to Visit  Medication Dose Route Frequency Provider Last Rate Last Admin   Benralizumab SOSY 30 mg  30 mg Subcutaneous Q8 weeks Valentina Shaggy, MD   30 mg at 01/30/22 0946        ROS:  All others reviewed and negative.  Objective        PE:  BP 122/60 (BP Location: Left Arm, Patient Position: Sitting, Cuff Size: Normal)   Pulse 68   Temp 98.7 F (37.1 C) (Oral)   Ht '5\' 4"'$  (1.626 m)   Wt 110 lb (49.9 kg)   SpO2 97%   BMI 18.88 kg/m                 Constitutional: Pt appears in NAD               HENT: Head: NCAT.                Right Ear: External ear normal.                 Left Ear: External ear normal.                Eyes: . Pupils are equal, round, and reactive to light. Conjunctivae and EOM are normal               Nose: without d/c or deformity               Neck: Neck  supple. Gross normal ROM               Cardiovascular: Normal rate and regular rhythm.                 Pulmonary/Chest: Effort normal and breath sounds without rales or wheezing.                Abd:  Soft, NT, ND, + BS, no organomegaly               Neurological: Pt is alert. At baseline orientation, motor grossly intact               Skin: Skin is warm. No rashes, no other new lesions, LE edema - none               Psychiatric: Pt behavior is normal without agitation 2+ nervous  Micro: none  Cardiac tracings I have personally interpreted today:  none  Pertinent Radiological findings (summarize): none   Lab Results  Component Value Date   WBC 7.9 01/10/2022   HGB 13.7 01/10/2022   HCT 40.2 01/10/2022   PLT 261.0 01/10/2022   GLUCOSE 98 01/10/2022   CHOL 246 (H) 01/10/2022  TRIG 328.0 (H) 01/10/2022   HDL 41.40 01/10/2022   LDLDIRECT 165.0 01/10/2022   [place] Comment 03/12/2017   ALT 7 01/10/2022   AST 11 01/10/2022   NA 138 01/10/2022   K 4.1 01/10/2022   CL 105 01/10/2022   CREATININE 0.84 01/10/2022   BUN 6 01/10/2022   CO2 26 01/10/2022   TSH 1.42 01/10/2022   INR 0.9 08/31/2008   HGBA1C 5.3 01/10/2022   Assessment/Plan:  Lori [name] is a 66 y.o. White or Caucasian [1] female with  has a past medical history of Allergic rhinitis, Anemia, Ankle fracture (05/2016), Anxiety, Arthritis, Asthma, Barrett esophagus (2007), Chest pain, COPD (chronic obstructive pulmonary disease) with chronic bronchitis ([place]), Depression, Duodenitis without mention of hemorrhage (2007), Esophageal reflux (2007), Esophageal stricture, Full dentures, H/O hiatal hernia, Hiatal hernia (0300,9233), HSV infection, Hyperlipidemia, Multiple fractures, Pneumonia (10/2016), Restrictive lung disease (06/30/2017), and Ulcer.  Vitamin D deficiency Last vitamin D Lab Results  Component Value Date   VD25OH 22.86 (L) 01/10/2022   Low, to start oral replacement d  Cigarette smoker counsled to quit,  pt not ready  Chronic low back pain with sciatica Uncontrolled pain o/w stable, for tramadol prn  Anxiety state Uncontrolled and unable for wt gain - for paxil 10 mg qd , declines referral for counseling at this time  Followup: Return in about 4 months (around 06/15/2022).  Cathlean Cower, MD 02/13/2022 7:54 PM [facility]

## 2022-02-13 ENCOUNTER — Encounter: Payer: Self-pay | Admitting: Internal Medicine

## 2022-02-13 NOTE — Assessment & Plan Note (Signed)
Uncontrolled and unable for wt gain - for paxil 10 mg qd , declines referral for counseling at this time

## 2022-02-13 NOTE — Assessment & Plan Note (Signed)
Uncontrolled pain o/w stable, for tramadol prn

## 2022-02-13 NOTE — Assessment & Plan Note (Signed)
counsled to quit, pt not ready 

## 2022-03-01 ENCOUNTER — Other Ambulatory Visit: Payer: Self-pay | Admitting: Internal Medicine

## 2022-03-06 ENCOUNTER — Ambulatory Visit (INDEPENDENT_AMBULATORY_CARE_PROVIDER_SITE_OTHER): Payer: Medicare Other

## 2022-03-06 DIAGNOSIS — J309 Allergic rhinitis, unspecified: Secondary | ICD-10-CM

## 2022-03-06 DIAGNOSIS — G43719 Chronic migraine without aura, intractable, without status migrainosus: Secondary | ICD-10-CM | POA: Diagnosis not present

## 2022-03-12 ENCOUNTER — Telehealth: Payer: Self-pay | Admitting: Internal Medicine

## 2022-03-12 IMAGING — DX DG WRIST COMPLETE 3+V*R*
4 series · 4 of 4 positions shown · non-contrast
Comparison: None.

CLINICAL DATA: Elbow and wrist pain, fall

EXAM:
RIGHT WRIST - COMPLETE 3+ VIEW

[wrist pa]
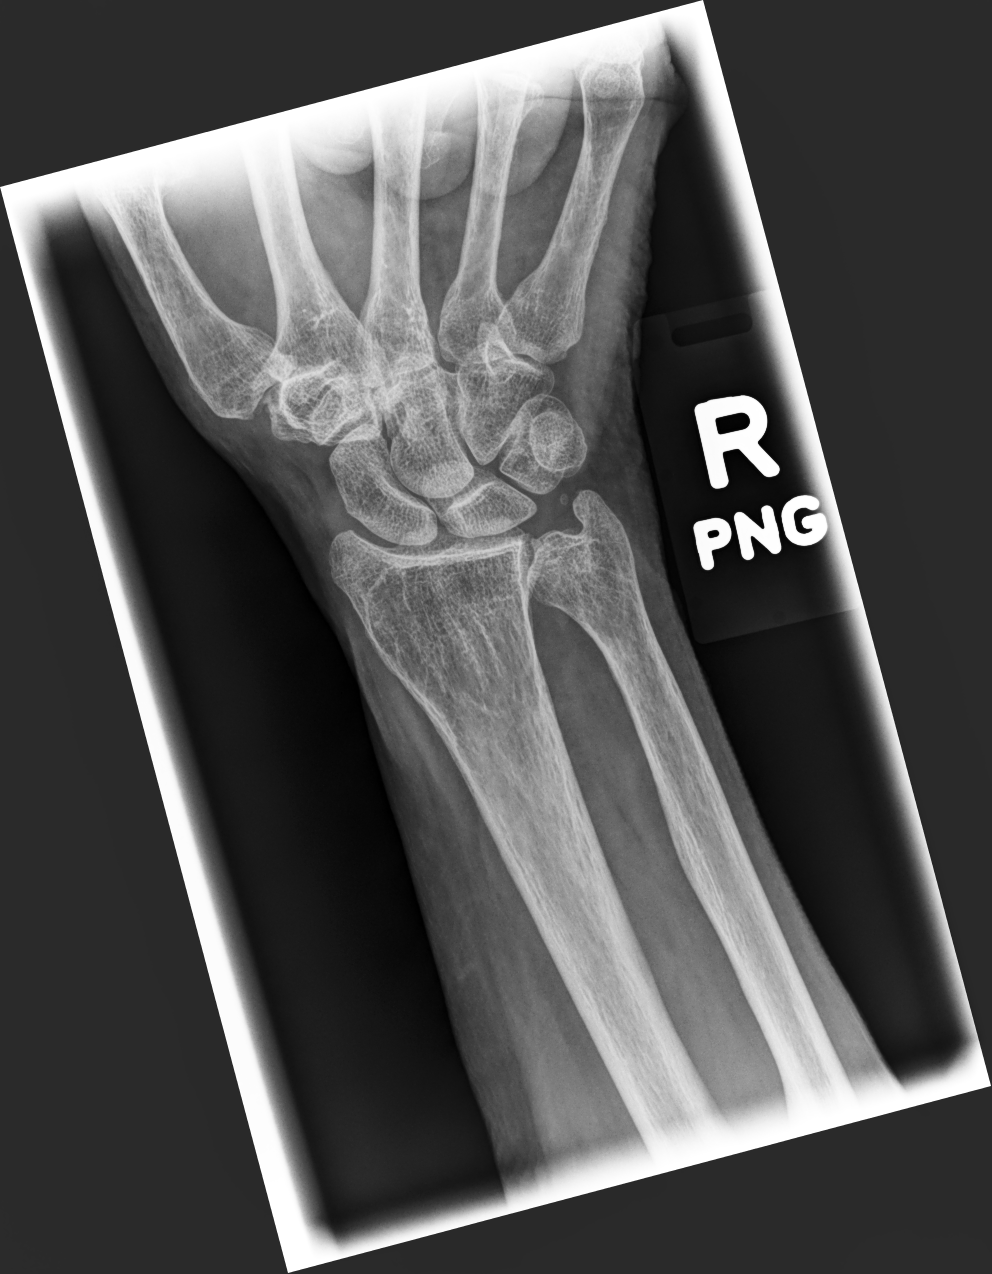

[wrist mlo]
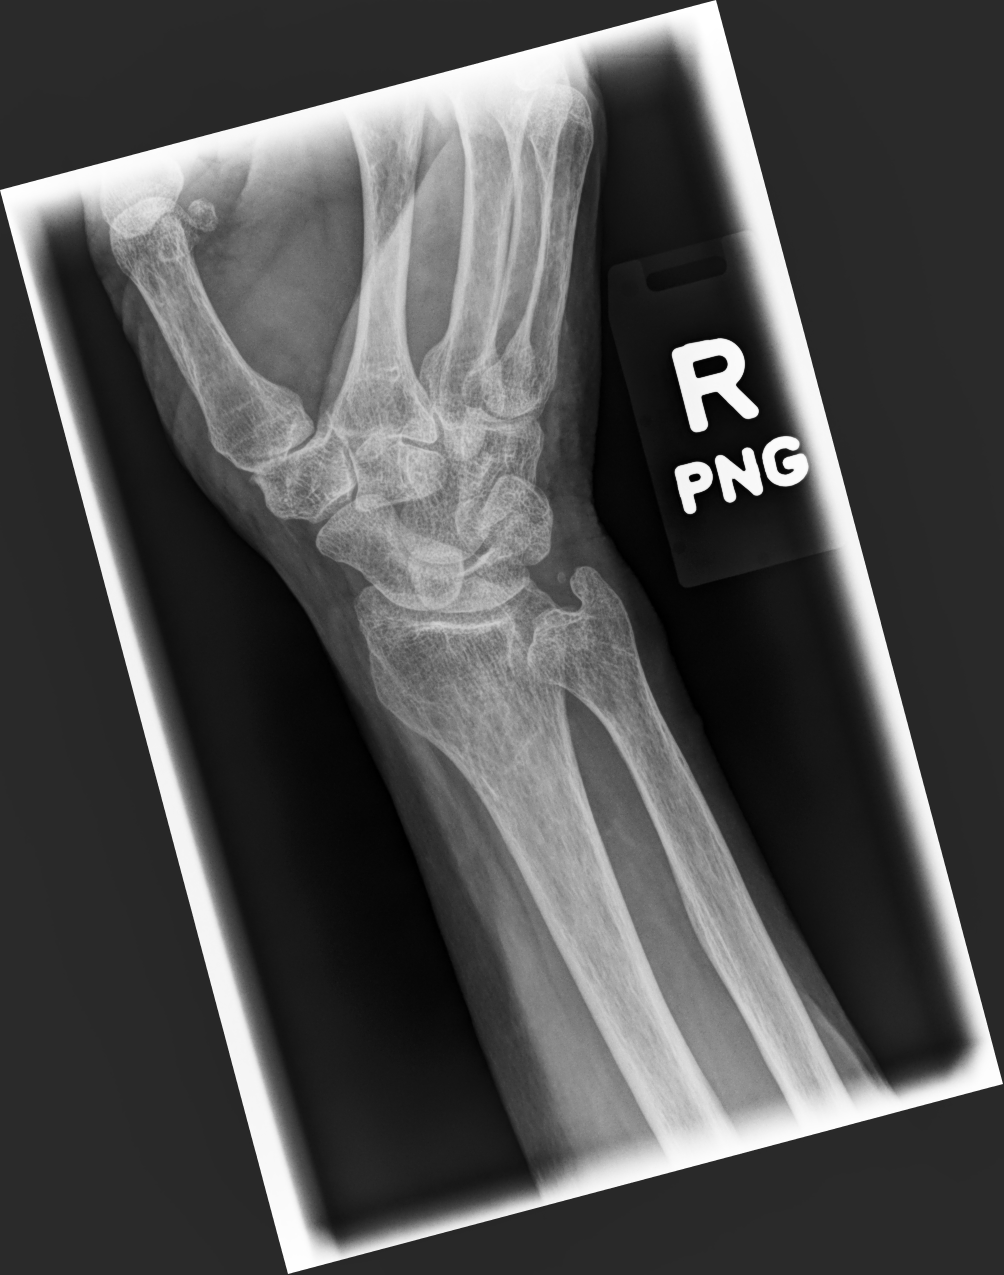

[wrist lat]
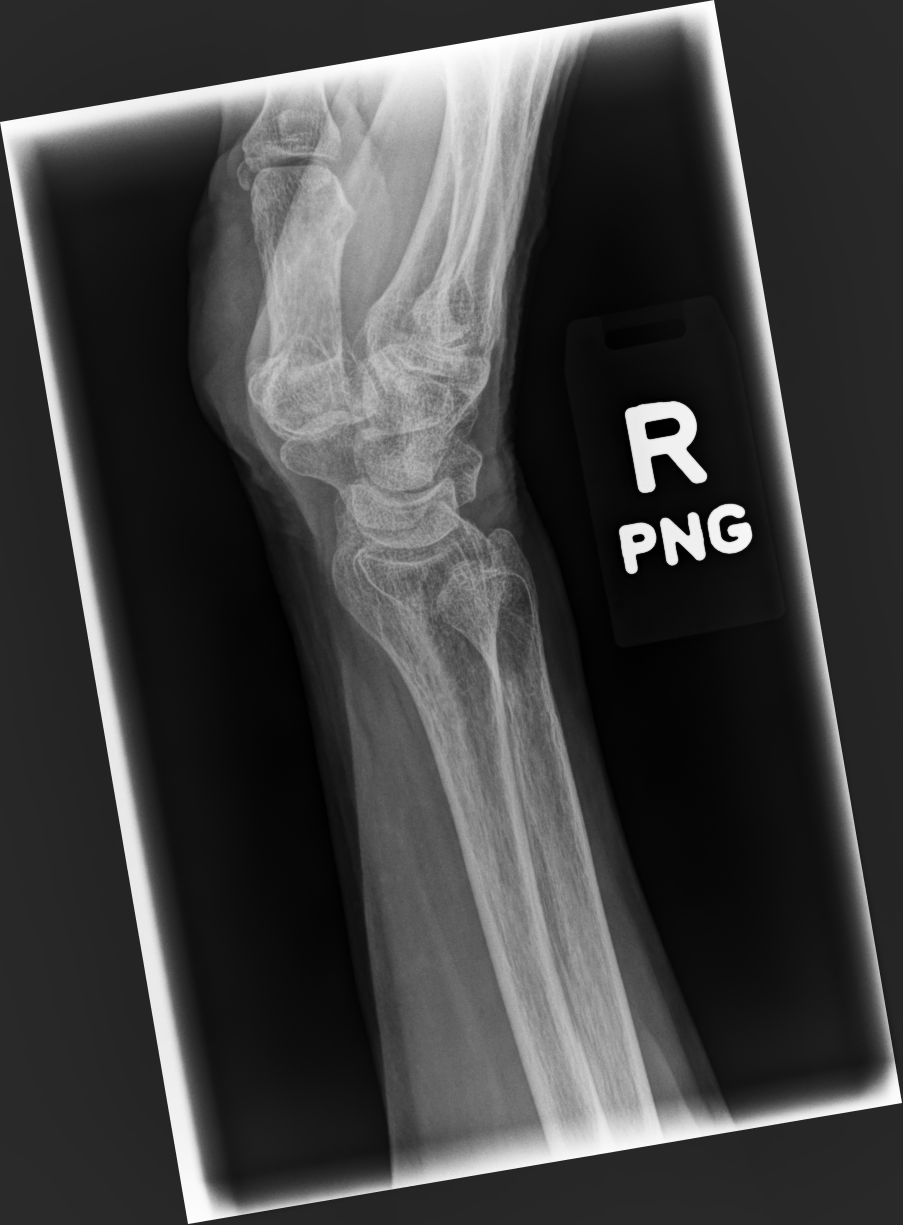

[hand pa]
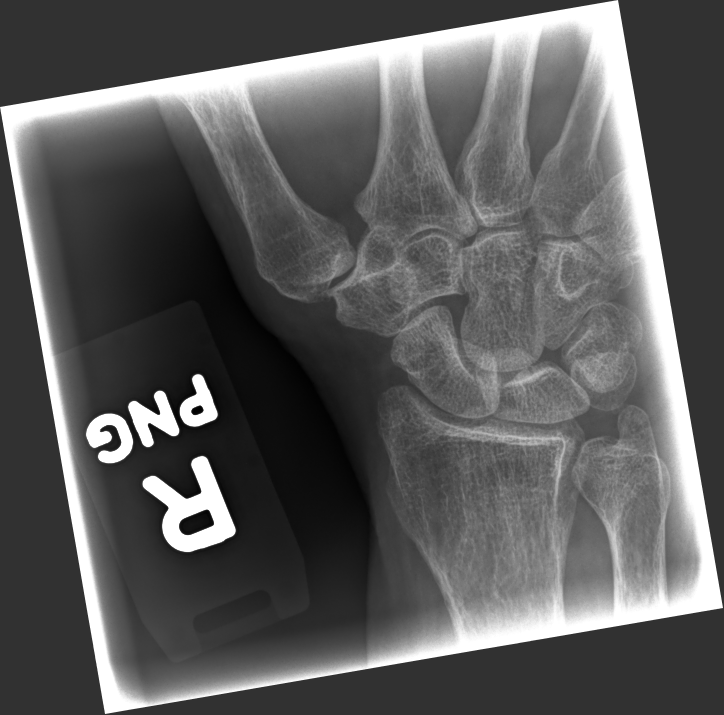

[4 of 4 positions shown; findings below may reference images not displayed]

FINDINGS: Mild degenerative changes at the 1st carpometacarpal joint. No acute
bony abnormality. Specifically, no fracture, subluxation, or
dislocation.
IMPRESSION: No acute bony abnormality.

## 2022-03-14 ENCOUNTER — Telehealth: Payer: Self-pay | Admitting: Orthopedic Surgery

## 2022-03-14 NOTE — Telephone Encounter (Signed)
IC no answer. LMVM advising Dr Marlou Sa not actively treating patient for orthopedic condition and does not treat nausea unrelated to orthopedic condition. Advised she should reach out to PCP or go to ER/urgent care

## 2022-03-14 NOTE — Telephone Encounter (Signed)
Patient called in stating she needs a refill of Nausea medication ondansetron her PCP told her Marlou Sa was the last one who prescribed it and he would need to be the one to refill it patient ate potpie and has been sick for about 3 days now

## 2022-03-15 ENCOUNTER — Emergency Department (HOSPITAL_COMMUNITY)
Admission: EM | Admit: 2022-03-15 | Discharge: 2022-03-15 | Disposition: A | Discharge status: Home / Self Care | Payer: Medicare Other | Attending: Emergency Medicine | Admitting: Emergency Medicine

## 2022-03-15 ENCOUNTER — Encounter (HOSPITAL_COMMUNITY): Payer: Self-pay | Admitting: Emergency Medicine

## 2022-03-15 ENCOUNTER — Other Ambulatory Visit: Payer: Self-pay

## 2022-03-15 DIAGNOSIS — E860 Dehydration: Secondary | ICD-10-CM | POA: Diagnosis not present

## 2022-03-15 DIAGNOSIS — R1110 Vomiting, unspecified: Secondary | ICD-10-CM | POA: Diagnosis not present

## 2022-03-15 DIAGNOSIS — R1111 Vomiting without nausea: Secondary | ICD-10-CM | POA: Diagnosis not present

## 2022-03-15 DIAGNOSIS — Z743 Need for continuous supervision: Secondary | ICD-10-CM | POA: Diagnosis not present

## 2022-03-15 DIAGNOSIS — M545 Low back pain, unspecified: Secondary | ICD-10-CM | POA: Diagnosis not present

## 2022-03-15 DIAGNOSIS — R252 Cramp and spasm: Secondary | ICD-10-CM | POA: Insufficient documentation

## 2022-03-15 DIAGNOSIS — Z91040 Latex allergy status: Secondary | ICD-10-CM | POA: Diagnosis not present

## 2022-03-15 LAB — URINALYSIS, ROUTINE W REFLEX MICROSCOPIC
Bacteria, UA: NONE SEEN
Bilirubin Urine: NEGATIVE
Glucose, UA: NEGATIVE mg/dL
Ketones, ur: NEGATIVE mg/dL
Nitrite: NEGATIVE
Protein, ur: NEGATIVE mg/dL
Specific Gravity, Urine: 1.011 (ref 1.005–1.030)
pH: 6 (ref 5.0–8.0)

## 2022-03-15 LAB — COMPREHENSIVE METABOLIC PANEL
ALT: 10 U/L (ref 0–44)
AST: 19 U/L (ref 15–41)
Albumin: 3.9 g/dL (ref 3.5–5.0)
Alkaline Phosphatase: 61 U/L (ref 38–126)
Anion gap: 6 (ref 5–15)
BUN: 9 mg/dL (ref 8–23)
CO2: 25 mmol/L (ref 22–32)
Calcium: 9 mg/dL (ref 8.9–10.3)
Chloride: 109 mmol/L (ref 98–111)
Creatinine, Ser: 1.26 mg/dL — ABNORMAL HIGH (ref 0.44–1.00)
GFR, Estimated: 47 mL/min — ABNORMAL LOW (ref >60)
Glucose, Bld: 98 mg/dL (ref 70–99)
Potassium: 5 mmol/L (ref 3.5–5.1)
Sodium: 140 mmol/L (ref 135–145)
Total Bilirubin: 1.4 mg/dL — ABNORMAL HIGH (ref 0.3–1.2)
Total Protein: 6.4 g/dL — ABNORMAL LOW (ref 6.5–8.1)

## 2022-03-15 LAB — LIPASE, BLOOD: Lipase: 24 U/L (ref 11–51)

## 2022-03-15 MED ORDER — METOCLOPRAMIDE HCL 5 MG/ML IJ SOLN
5.0000 mg | Freq: Once | INTRAMUSCULAR | Status: AC
  Administered 2022-03-15: 5 mg via INTRAVENOUS
  Filled 2022-03-15: qty 2

## 2022-03-15 MED ORDER — ONDANSETRON HCL 4 MG/2ML IJ SOLN
4.0000 mg | Freq: Once | INTRAMUSCULAR | Status: AC
  Administered 2022-03-15: 4 mg via INTRAVENOUS
  Filled 2022-03-15: qty 2

## 2022-03-15 MED ORDER — MORPHINE SULFATE (PF) 4 MG/ML IV SOLN
4.0000 mg | Freq: Once | INTRAVENOUS | Status: AC
  Administered 2022-03-15: 4 mg via INTRAVENOUS
  Filled 2022-03-15: qty 1

## 2022-03-15 MED ORDER — LACTATED RINGERS IV SOLN: INTRAVENOUS | Status: DC

## 2022-03-15 MED ORDER — DIPHENHYDRAMINE HCL 50 MG/ML IJ SOLN
25.0000 mg | Freq: Once | INTRAMUSCULAR | Status: AC
  Administered 2022-03-15: 25 mg via INTRAVENOUS

## 2022-03-15 MED ORDER — LACTATED RINGERS IV BOLUS
1000.0000 mL | Freq: Once | INTRAVENOUS | Status: AC
  Administered 2022-03-15: 1000 mL via INTRAVENOUS

## 2022-03-15 NOTE — ED Notes (Signed)
Patient having redness and itching to left arm. EDP aware.

## 2022-03-15 NOTE — ED Provider Notes (Signed)
[facility] DEPT Provider Note   CSN: [id number] Arrival date & time: 03/15/22  1557     History  Chief Complaint  Patient presents with   Emesis   Back Pain    Lori [name] is a 66 y.o. female.  66 year old female who presents with 6 days of nonbilious emesis with associate abdominal cramping.  Patient also has chronic back spasms which she states are getting worse.  Patient states that she is scheduled to be seen by GI for endoscopy to evaluate for possible cancer.  Denies any urinary symptoms.  Unsure of what type of cancer this is.  Denies any black stools.  Has had some constipation.  Thought that maybe she saw some hemorrhoids she wiped.  Denies any history of same.  No reported fever or chills.  No treatment use prior to arrival       Home Medications Prior to Admission medications   Medication Sig Start Date End Date Taking? Authorizing Provider  AIMOVIG 140 MG/ML SOAJ Inject 140 mg into the skin every 30 (thirty) days. 01/09/21   [provider]  ALPRAZolam Duanne Moron) 1 MG tablet TAKE 1 TABLET BY MOUTH AT BEDTIME AS NEEDED FOR ANXIETY 02/04/22   Biagio Borg, MD  benzonatate (TESSALON) 200 MG capsule Take 1 capsule (200 mg total) by mouth 2 (two) times daily as needed for cough. 01/23/22   Henson, Vickie L, NP-C  Budeson-Glycopyrrol-Formoterol (BREZTRI AEROSPHERE) 160-9-4.8 MCG/ACT AERO Inhale 2 puffs once a day with spacer to help prevent cough and wheeze. Patient taking differently: Inhale 2 puffs into the lungs daily as needed (coughing and wheezing). Inhale 2 puffs once a day with spacer to help prevent cough and wheeze. 09/27/21   Althea Charon, FNP  DEXILANT 60 MG capsule Take 1 capsule (60 mg total) by mouth daily. Patient taking differently: Take 1 capsule by mouth in the morning and at bedtime. 09/27/21   Althea Charon, FNP  EPINEPHrine 0.3 mg/0.3 mL IJ SOAJ injection INJECT AS DIRECTED FOR SEVERE ALLERGIC REACTION 09/27/21    Althea Charon, FNP  [facility] 30 MG/ML SOSY INJECT '30MG'$  SUBCUTANEOUSLY  EVERY 8 WEEKS 09/04/21   Valentina Shaggy, MD  gabapentin (NEURONTIN) 300 MG capsule Take 300 mg by mouth 2 (two) times daily. 07/13/20   [provider]  guaiFENesin-dextromethorphan (ROBITUSSIN DM) 100-10 MG/5ML syrup Take 5 mLs by mouth every 4 (four) hours as needed for cough. 02/01/22   Biagio Borg, MD  meclizine (ANTIVERT) 25 MG tablet TAKE 1 TABLET BY MOUTH THREE TIMES DAILY AS NEEDED FOR DIZZINESS 03/01/22   Biagio Borg, MD  montelukast (SINGULAIR) 10 MG tablet Take 1 tablet (10 mg total) by mouth at bedtime. 09/27/21   Althea Charon, FNP  nitroGLYCERIN (NITROSTAT) 0.4 MG SL tablet Place 1 tablet (0.4 mg total) under the tongue every 5 (five) minutes as needed for chest pain. 05/17/21   Deberah Pelton, NP  ondansetron (ZOFRAN) 4 MG tablet Take 1 tablet (4 mg total) by mouth every 8 (eight) hours as needed for nausea or vomiting. 12/23/21   Meredith Pel, MD  PARoxetine (PAXIL) 10 MG tablet Take 1 tablet (10 mg total) by mouth daily. 02/12/22   Biagio Borg, MD  predniSONE (STERAPRED UNI-PAK 21 TAB) 10 MG (21) TBPK tablet Take by mouth daily. 01/23/22   Henson, Vickie L, NP-C  tiZANidine (ZANAFLEX) 2 MG tablet Take 1 tablet (2 mg total) by mouth every 8 (eight) hours as needed for muscle  spasms. 12/04/21   Hazel Sams, PA-C  traMADol (ULTRAM) 50 MG tablet Take 1 tablet (50 mg total) by mouth every 6 (six) hours as needed. 02/12/22   Biagio Borg, MD  valACYclovir (VALTREX) 1000 MG tablet Take 1 tablet by mouth twice daily 07/30/21   Biagio Borg, MD      Allergies    Bee venom, Codeine, Ibuprofen, Clonidine derivatives, Statins, Tramadol, Latex, Levofloxacin, and Propoxyphene n-acetaminophen    Review of Systems   Review of Systems  All other systems reviewed and are negative.   Physical Exam Updated Vital Signs BP 120/82 (BP Location: Right Arm)   Pulse 66   Temp 98.1 F (36.7 C) (Oral)    Resp 16   Ht 1.626 m ('5\' 4"'$ )   Wt 49.9 kg   SpO2 98%   BMI 18.88 kg/m  Physical Exam Vitals and nursing note reviewed. Exam conducted with a chaperone present.  Constitutional:      General: She is not in acute distress.    Appearance: Normal appearance. She is well-developed. She is not toxic-appearing.  HENT:     Head: Normocephalic and atraumatic.  Eyes:     General: Lids are normal.     Conjunctiva/sclera: Conjunctivae normal.     Pupils: Pupils are equal, round, and reactive to light.  Neck:     Thyroid: No thyroid mass.     Trachea: No tracheal deviation.  Cardiovascular:     Rate and Rhythm: Normal rate and regular rhythm.     Heart sounds: Normal heart sounds. No murmur heard.    No gallop.  Pulmonary:     Effort: Pulmonary effort is normal. No respiratory distress.     Breath sounds: Normal breath sounds. No stridor. No decreased breath sounds, wheezing, rhonchi or rales.  Abdominal:     General: There is no distension.     Palpations: Abdomen is soft.     Tenderness: There is no abdominal tenderness. There is no rebound.  Genitourinary:    Rectum: No external hemorrhoid or internal hemorrhoid. Normal anal tone.  Musculoskeletal:        General: No tenderness. Normal range of motion.     Cervical back: Normal range of motion and neck supple.  Skin:    General: Skin is warm and dry.     Findings: No abrasion or rash.  Neurological:     Mental Status: She is alert and oriented to person, place, and time. Mental status is at baseline.     GCS: GCS eye subscore is 4. GCS verbal subscore is 5. GCS motor subscore is 6.     Cranial Nerves: No cranial nerve deficit.     Sensory: No sensory deficit.     Motor: Motor function is intact.  Psychiatric:        Attention and Perception: Attention normal.        Speech: Speech normal.        Behavior: Behavior normal.     ED Results / Procedures / Treatments   Labs (all labs ordered are listed, but only abnormal  results are displayed) Labs Reviewed - No data to display  EKG None  Radiology No results found.  Procedures Procedures    Medications Ordered in ED Medications  lactated ringers bolus 1,000 mL (has no administration in time range)  lactated ringers infusion (has no administration in time range)  ondansetron (ZOFRAN) injection 4 mg (has no administration in time range)  morphine (PF) 4  MG/ML injection 4 mg (has no administration in time range)    ED Course/ Medical Decision Making/ A&P                           Medical Decision Making Amount and/or Complexity of Data Reviewed Labs: ordered.  Risk Prescription drug management.   Patient medicated here with IV fluids and feels better at this time.  Also given IV analgesics.  Patient request to go home at this time.  Will discharge        Final Clinical Impression(s) / ED Diagnoses Final diagnoses:  None    Rx / DC Orders ED Discharge Orders     None         Lacretia Leigh, MD 03/15/22 2016

## 2022-03-15 NOTE — ED Triage Notes (Signed)
Patient brought in by [facility] c/o nausea, vomiting and lower back pain/ spasms x 6 days. Patient also stating she has 2 hemorrhoids that need to be checked.

## 2022-03-17 ENCOUNTER — Other Ambulatory Visit: Payer: Self-pay | Admitting: Internal Medicine

## 2022-03-26 IMAGING — DX DG HAND COMPLETE 3+V*R*
4 series · 4 of 4 positions shown · non-contrast
Comparison: Wrist radiograph 01/17/2020

CLINICAL DATA: Right hand and wrist pain. Fall 1 or 2 months ago.
Persistent pain but the right thumb to mid hand.

EXAM:
RIGHT HAND - COMPLETE 3+ VIEW

[hand ap]
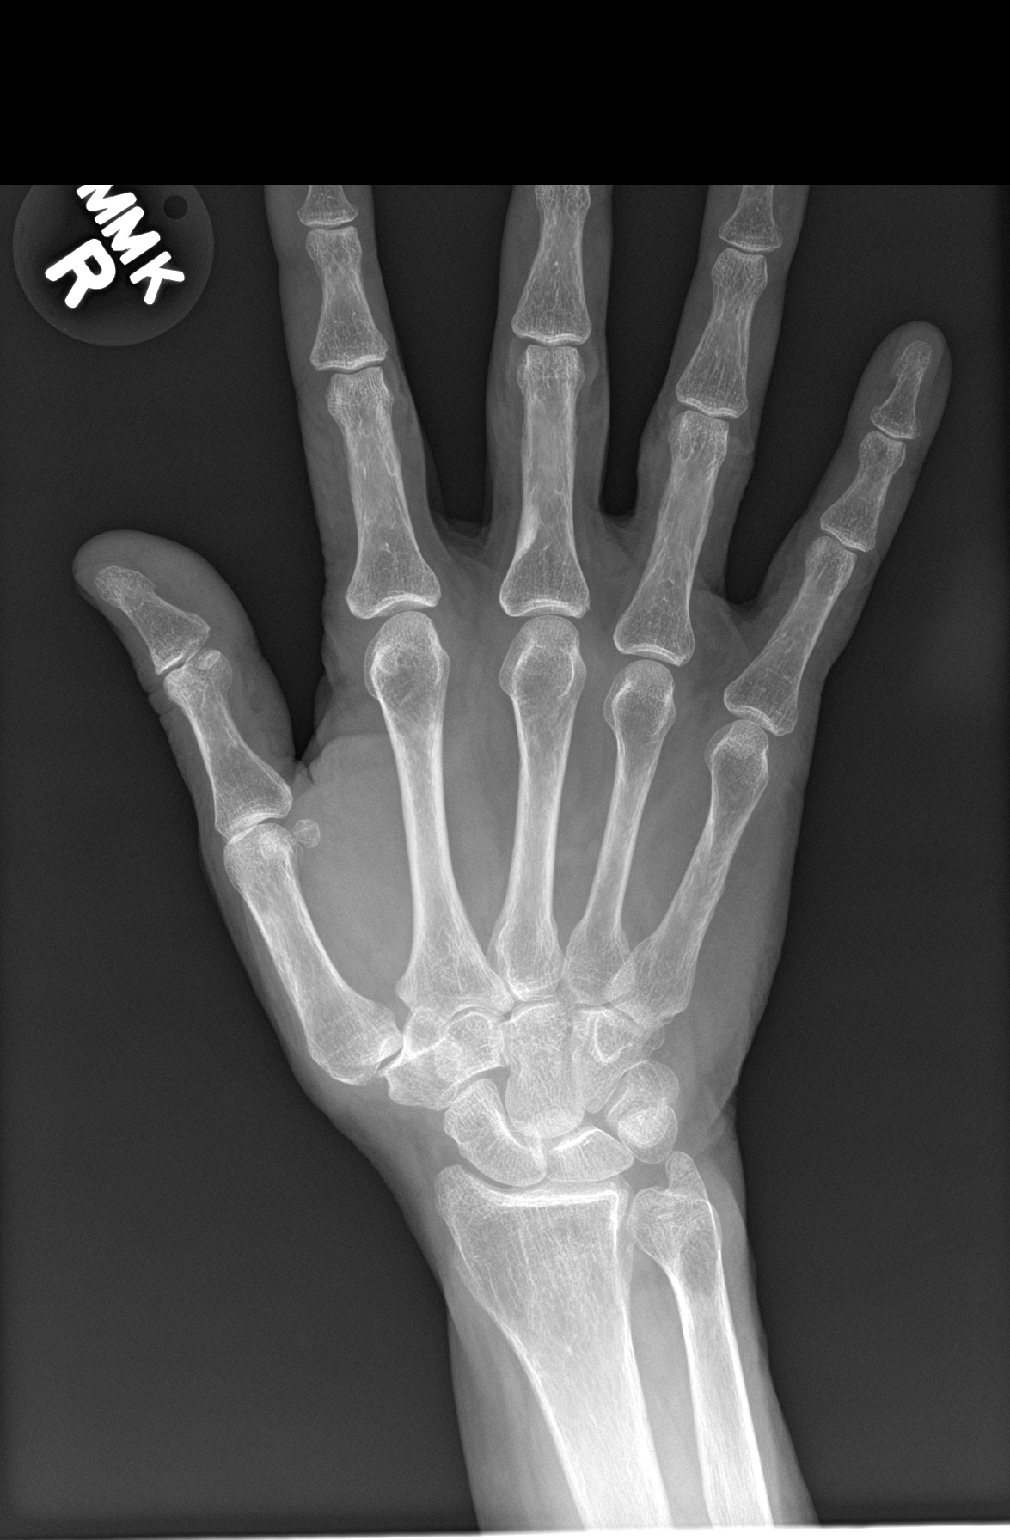

[hand obl]
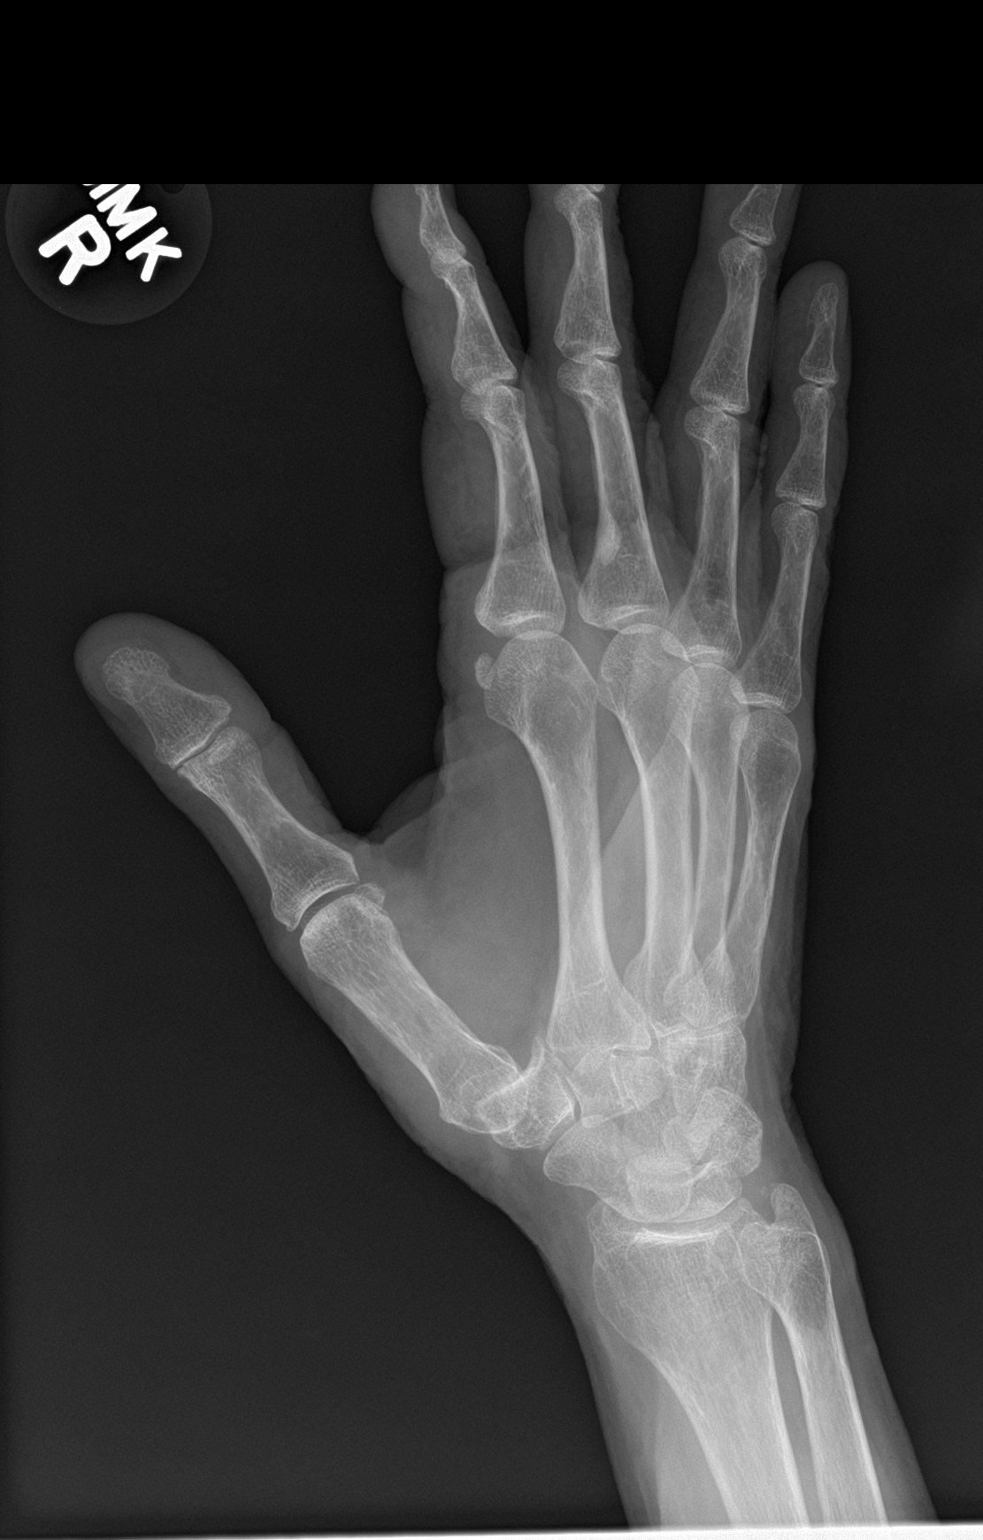

[hand lat (1 of 2)]
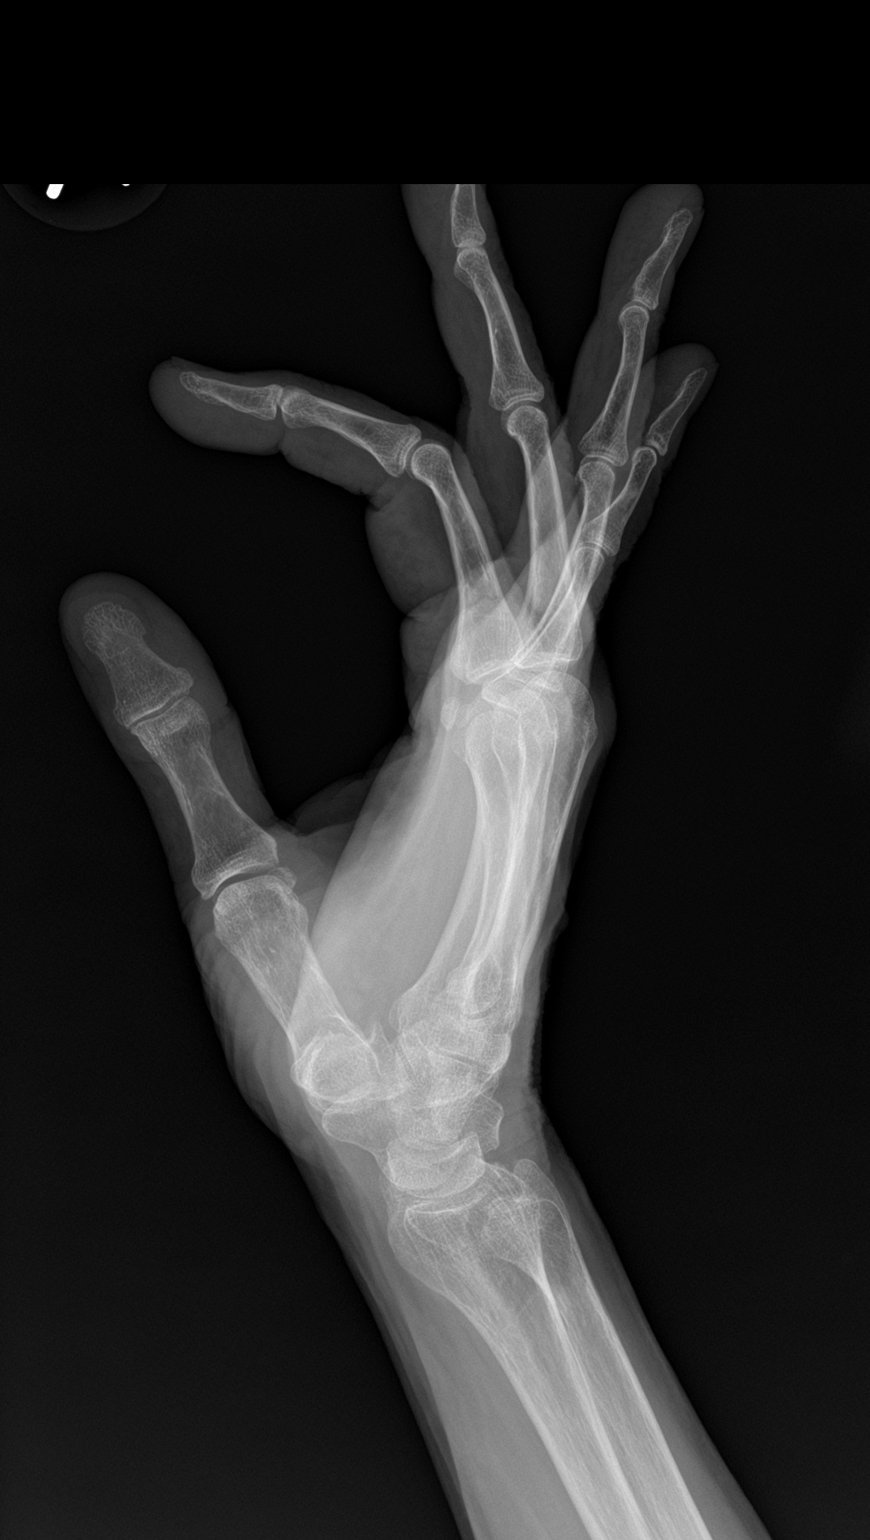

[hand lat (2 of 2)]
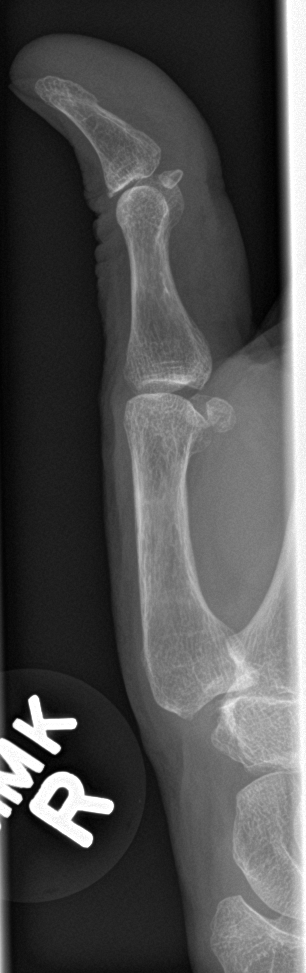

[4 of 4 positions shown; findings below may reference images not displayed]

FINDINGS: No evidence of acute or healing fracture. Normal alignment. Minor
osteoarthritis at the thumb interphalangeal and metacarpal
phalangeal joints. No erosion, periosteal reaction, or bony
destruction. Soft tissues are unremarkable.
IMPRESSION: Minor osteoarthritis of the thumb. No evidence of acute or healing
fracture.

## 2022-03-27 ENCOUNTER — Ambulatory Visit (INDEPENDENT_AMBULATORY_CARE_PROVIDER_SITE_OTHER): Payer: Medicare Other

## 2022-03-27 DIAGNOSIS — J4550 Severe persistent asthma, uncomplicated: Secondary | ICD-10-CM | POA: Diagnosis not present

## 2022-03-28 ENCOUNTER — Encounter (HOSPITAL_COMMUNITY): Payer: Self-pay | Admitting: Gastroenterology

## 2022-03-28 NOTE — Progress Notes (Signed)
Attempted to obtain medical history via telephone, unable to reach at this time. HIPAA compliant voicemail message left requesting return call to pre surgical testing department. 

## 2022-04-02 ENCOUNTER — Ambulatory Visit (INDEPENDENT_AMBULATORY_CARE_PROVIDER_SITE_OTHER): Payer: Medicare Other

## 2022-04-02 DIAGNOSIS — J309 Allergic rhinitis, unspecified: Secondary | ICD-10-CM

## 2022-04-04 ENCOUNTER — Ambulatory Visit (HOSPITAL_COMMUNITY): Payer: Medicare Other

## 2022-04-04 ENCOUNTER — Ambulatory Visit (HOSPITAL_COMMUNITY): Payer: Medicare Other | Admitting: Anesthesiology

## 2022-04-04 ENCOUNTER — Ambulatory Visit (HOSPITAL_COMMUNITY)
Admission: RE | Admit: 2022-04-04 | Discharge: 2022-04-04 | Disposition: A | Discharge status: Home / Self Care | Payer: Medicare Other | Attending: Gastroenterology | Admitting: Gastroenterology

## 2022-04-04 ENCOUNTER — Other Ambulatory Visit: Payer: Self-pay

## 2022-04-04 ENCOUNTER — Ambulatory Visit (HOSPITAL_BASED_OUTPATIENT_CLINIC_OR_DEPARTMENT_OTHER): Payer: Medicare Other | Admitting: Anesthesiology

## 2022-04-04 ENCOUNTER — Encounter (HOSPITAL_COMMUNITY): Payer: Self-pay | Admitting: Gastroenterology

## 2022-04-04 ENCOUNTER — Telehealth: Payer: Self-pay | Admitting: Gastroenterology

## 2022-04-04 ENCOUNTER — Encounter (HOSPITAL_COMMUNITY)
Admission: RE | Disposition: A | Discharge status: Home / Self Care | Payer: Self-pay | Source: Home / Self Care | Attending: Gastroenterology

## 2022-04-04 DIAGNOSIS — F17200 Nicotine dependence, unspecified, uncomplicated: Secondary | ICD-10-CM | POA: Insufficient documentation

## 2022-04-04 DIAGNOSIS — K219 Gastro-esophageal reflux disease without esophagitis: Secondary | ICD-10-CM | POA: Insufficient documentation

## 2022-04-04 DIAGNOSIS — K449 Diaphragmatic hernia without obstruction or gangrene: Secondary | ICD-10-CM | POA: Insufficient documentation

## 2022-04-04 DIAGNOSIS — R109 Unspecified abdominal pain: Secondary | ICD-10-CM | POA: Diagnosis not present

## 2022-04-04 DIAGNOSIS — R932 Abnormal findings on diagnostic imaging of liver and biliary tract: Secondary | ICD-10-CM | POA: Diagnosis not present

## 2022-04-04 DIAGNOSIS — F32A Depression, unspecified: Secondary | ICD-10-CM | POA: Diagnosis not present

## 2022-04-04 DIAGNOSIS — F419 Anxiety disorder, unspecified: Secondary | ICD-10-CM | POA: Diagnosis not present

## 2022-04-04 DIAGNOSIS — I899 Noninfective disorder of lymphatic vessels and lymph nodes, unspecified: Secondary | ICD-10-CM | POA: Diagnosis not present

## 2022-04-04 DIAGNOSIS — K838 Other specified diseases of biliary tract: Secondary | ICD-10-CM | POA: Diagnosis not present

## 2022-04-04 DIAGNOSIS — K862 Cyst of pancreas: Secondary | ICD-10-CM | POA: Diagnosis not present

## 2022-04-04 DIAGNOSIS — J449 Chronic obstructive pulmonary disease, unspecified: Secondary | ICD-10-CM | POA: Diagnosis not present

## 2022-04-04 DIAGNOSIS — R1011 Right upper quadrant pain: Secondary | ICD-10-CM | POA: Insufficient documentation

## 2022-04-04 DIAGNOSIS — M797 Fibromyalgia: Secondary | ICD-10-CM | POA: Insufficient documentation

## 2022-04-04 DIAGNOSIS — K3189 Other diseases of stomach and duodenum: Secondary | ICD-10-CM

## 2022-04-04 DIAGNOSIS — K2270 Barrett's esophagus without dysplasia: Secondary | ICD-10-CM | POA: Diagnosis not present

## 2022-04-04 DIAGNOSIS — F17210 Nicotine dependence, cigarettes, uncomplicated: Secondary | ICD-10-CM | POA: Diagnosis not present

## 2022-04-04 DIAGNOSIS — K2289 Other specified disease of esophagus: Secondary | ICD-10-CM

## 2022-04-04 DIAGNOSIS — R918 Other nonspecific abnormal finding of lung field: Secondary | ICD-10-CM | POA: Diagnosis not present

## 2022-04-04 DIAGNOSIS — R079 Chest pain, unspecified: Secondary | ICD-10-CM | POA: Diagnosis not present

## 2022-04-04 DIAGNOSIS — Z9889 Other specified postprocedural states: Secondary | ICD-10-CM | POA: Diagnosis not present

## 2022-04-04 HISTORY — PX: ESOPHAGOGASTRODUODENOSCOPY (EGD) WITH PROPOFOL: SHX5813

## 2022-04-04 HISTORY — PX: EUS: SHX5427

## 2022-04-04 HISTORY — PX: BIOPSY: SHX5522

## 2022-04-04 SURGERY — UPPER ENDOSCOPIC ULTRASOUND (EUS) RADIAL
Anesthesia: Monitor Anesthesia Care

## 2022-04-04 MED ORDER — HYOSCYAMINE SULFATE SL 0.125 MG SL SUBL: 0.1250 mg | SUBLINGUAL_TABLET | Freq: Three times a day (TID) | SUBLINGUAL | 2 refills | Status: DC | PRN

## 2022-04-04 MED ORDER — FENTANYL CITRATE (PF) 100 MCG/2ML IJ SOLN
25.0000 ug | Freq: Once | INTRAMUSCULAR | Status: AC
  Administered 2022-04-04: 25 ug via INTRAVENOUS

## 2022-04-04 MED ORDER — HYOSCYAMINE SULFATE 0.125 MG SL SUBL: 0.2500 mg | SUBLINGUAL_TABLET | Freq: Once | SUBLINGUAL | Status: DC

## 2022-04-04 MED ORDER — SODIUM CHLORIDE 0.9 % IV SOLN: INTRAVENOUS | Status: DC

## 2022-04-04 MED ORDER — FENTANYL CITRATE (PF) 100 MCG/2ML IJ SOLN
INTRAMUSCULAR | Status: AC
  Filled 2022-04-04: qty 2

## 2022-04-04 MED ORDER — ACETAMINOPHEN 10 MG/ML IV SOLN
INTRAVENOUS | Status: AC
  Filled 2022-04-04: qty 100

## 2022-04-04 MED ORDER — LACTATED RINGERS IV SOLN: INTRAVENOUS | Status: DC

## 2022-04-04 MED ORDER — ACETAMINOPHEN 10 MG/ML IV SOLN
1000.0000 mg | Freq: Once | INTRAVENOUS | Status: AC
Start: 2022-04-04 — End: 2022-04-04
  Administered 2022-04-04: 1000 mg via INTRAVENOUS

## 2022-04-04 MED ORDER — PROPOFOL 500 MG/50ML IV EMUL
INTRAVENOUS | Status: DC | PRN
  Administered 2022-04-04: 100 ug/kg/min via INTRAVENOUS

## 2022-04-04 MED ORDER — PROPOFOL 10 MG/ML IV BOLUS
INTRAVENOUS | Status: DC | PRN
  Administered 2022-04-04 (×3): 30 mg via INTRAVENOUS

## 2022-04-04 NOTE — Anesthesia Preprocedure Evaluation (Addendum)
Anesthesia Evaluation  Patient identified by MRN, date of birth, ID band Patient awake    Reviewed: Allergy & Precautions, NPO status , Patient's Chart, lab work & pertinent test results  History of Anesthesia Complications Negative for: history of anesthetic complications  Airway Mallampati: I  TM Distance: >3 FB Neck ROM: Full    Dental  (+) Edentulous Upper, Edentulous Lower, Dental Advisory Given   Pulmonary asthma , COPD, Current Smoker and Patient abstained from smoking.,    Pulmonary exam normal        Cardiovascular negative cardio ROS   Rhythm:Regular Rate:Normal  1. Left ventricular ejection fraction, by estimation, is 60 to 65%. The  left ventricle has normal function. The left ventricle has no regional  wall motion abnormalities. Left ventricular diastolic parameters are  consistent with Grade I diastolic  dysfunction (impaired relaxation).  2. Right ventricular systolic function is normal. The right ventricular  size is normal. Tricuspid regurgitation signal is inadequate for assessing  PA pressure.  3. The mitral valve is normal in structure. No evidence of mitral valve  regurgitation. No evidence of mitral stenosis.  4. The aortic valve is tricuspid. Aortic valve regurgitation is not  visualized. No aortic stenosis is present.  5. The inferior vena cava is normal in size with greater than 50%  respiratory variability, suggesting right atrial pressure of 3 mmHg.      Neuro/Psych  Headaches, PSYCHIATRIC DISORDERS Anxiety Depression  Neuromuscular disease    GI/Hepatic Neg liver ROS, hiatal hernia, GERD  ,  Endo/Other  negative endocrine ROS  Renal/GU Renal diseaseLab Results      Component                Value               Date                      CREATININE               1.07                09/05/2021                Musculoskeletal  (+) Arthritis , Fibromyalgia -  Abdominal   Peds   Hematology   Anesthesia Other Findings   Reproductive/Obstetrics                            Anesthesia Physical  Anesthesia Plan  ASA: 3  Anesthesia Plan: MAC   Post-op Pain Management: Minimal or no pain anticipated   Induction:   PONV Risk Score and Plan: 1 and Propofol infusion and Treatment may vary due to age or medical condition  Airway Management Planned: Nasal Cannula and Natural Airway  Additional Equipment: None  Intra-op Plan:   Post-operative Plan:   Informed Consent: I have reviewed the patients History and Physical, chart, labs and discussed the procedure including the risks, benefits and alternatives for the proposed anesthesia with the patient or authorized representative who has indicated his/her understanding and acceptance.     Dental advisory given  Plan Discussed with: Anesthesiologist and CRNA  Anesthesia Plan Comments: (PAT note by Karoline Caldwell, PA-C: Follows with allergy/asthma for history of moderate persistent asthma with COPD overlap.  She continues to smoke.  She is maintained on [place] daily, Xopenex daily, Berna Bue every 8 weeks last seen by Dr. Ernst Bowler 12/03/2021.  At that time she  was noted to be doing fairly well, however compliance with inhaled medications has been an issue.  She did report a couple days of cough productive of yellow sputum.  However, Dr. Ernst Bowler noted that her lungs sounded, "pristine".  She was recommended to use DuoNebs and Mucinex for 1 to 2 weeks.  No antibiotic felt necessary.  Per PAT RN, patient denied cough or worsened shortness of breath at preop visit.  Follows with cardiology for history of recurrent episodes of chest pain with multiple negative cardiac evaluations.  (Negative Myoview stress test, normal cardiac CTA).  Last seen by Dr. Ellyn Hack on 07/13/2021.  She was noted to still have intermittent chest pain symptoms that come and go, no real change in symptoms.  She was referred to [facility]  clinic for additional aggressive management.  CT cardiac scoring was also ordered for updated baseline CVD risk assessment, has not yet been completed.  Cardiac clearance for surgery per telephone encounter 11/14/2021, "Chart reviewed as part of pre-operative protocol coverage. Patient was contacted3/1/2023in reference to pre-operative risk assessment for pending surgery as outlined below. Ramina Hulet Dockerywas last seen on 10/28/22by Dr. Ellyn Hack. Since that day, KAIYLA STAHLY done well.Getting > 4 mets of activity.Therefore, based on ACC/AHA guidelines, the patient would be at acceptable risk for the planned procedure without further cardiovascular testing."  Preop CBC reviewed, WNL.  EKG 03/29/2021: Sinus rhythm.  Rate 85.  Borderline short PR interval.  Low voltage, precordial leads.  Abnormal R wave progression, early transition.  TTE 03/28/2021: 1. Left ventricular ejection fraction, by estimation, is 60 to 65%. The  left ventricle has normal function. The left ventricle has no regional  wall motion abnormalities. Left ventricular diastolic parameters are  consistent with Grade I diastolic  dysfunction (impaired relaxation).  2. Right ventricular systolic function is normal. The right ventricular  size is normal. Tricuspid regurgitation signal is inadequate for assessing  PA pressure.  3. The mitral valve is normal in structure. No evidence of mitral valve  regurgitation. No evidence of mitral stenosis.  4. The aortic valve is tricuspid. Aortic valve regurgitation is not  visualized. No aortic stenosis is present.  5. The inferior vena cava is normal in size with greater than 50%  respiratory variability, suggesting right atrial pressure of 3 mmHg.   Coronary CTA 12/02/2016: IMPRESSION: 1. Coronary calcium score of 0. This was 0 percentile for age and sex matched control.  2. Normal coronary origin. Right dominance.  3. No evidence of CAD. Consider non-cardiac origin  of chest pain.  Nuclear stress 09/28/2014: Impression Exercise Capacity: Lexiscan with no exercise. BP Response: Normal blood pressure response. Clinical Symptoms: No significant symptoms noted. ECG Impression: No significant ECG changes with Lexiscan. Comparison with Prior Nuclear Study: No previous nuclear study performed  Overall Impression: Normal stress nuclear study.TID is abnormal at 1.37.  LV Wall Motion: NL LV Function; NL Wall Motion; LVEF 66%.  )       Anesthesia Quick Evaluation

## 2022-04-04 NOTE — H&P (Addendum)
GASTROENTEROLOGY PROCEDURE H&P NOTE   Primary Care Physician: Biagio Borg, MD  HPI: Lori [name] is a 66 y.o. female who presents for EGD/EUS to evaluate biliary duct dilation, and abdominal pain.  Past Medical History:  Diagnosis Date   Allergic rhinitis    Anemia    hgb 13.9 on 11/22/21   Ankle fracture 05/2016   Anxiety    Arthritis    Asthma    Barrett esophagus 2007   Chest pain    Hx, no current problems as of 11/22/21.  Recurrent episodes.  Has had 3 negative nuclear stress test, and a completely normal coronary CT angiogram   COPD (chronic obstructive pulmonary disease) with chronic bronchitis (HCC)    & Emphysema   Depression    Duodenitis without mention of hemorrhage 2007   Esophageal reflux 2007   Esophageal stricture    Full dentures    patient does not wear dentures as of 11/22/21   H/O hiatal hernia    Hiatal hernia 2006,2007   HSV infection    Hyperlipidemia    rx crestor but pt not taking   Multiple fractures    from falls, fx rt. elbow, fx left wrist, bilateral ankles   Pneumonia 10/2016   Restrictive lung disease 06/30/2017   Ulcer    no current problems as of 11/22/21   Past Surgical History:  Procedure Laterality Date   ABDOMINAL HYSTERECTOMY     BSO   APPENDECTOMY     CESAREAN SECTION     x 2   CHOLECYSTECTOMY     CHONDROPLASTY Left 01/06/2015   Procedure: CHONDROPLASTY;  Surgeon: Marybelle Killings, MD;  Location: [place];  Service: Orthopedics;  Laterality: Left;   COLONOSCOPY     CT CTA CORONARY W/CA SCORE W/CM &/OR WO/CM  12/02/2016   CORONARY CALCIUM SCORE 0.  NO EVIDENCE OF CAD.  Normal-sized PA.  Normal pulmonary venous drainage the left atrium.  No ASD or VSD.   ELBOW FRACTURE SURGERY     right   EYE SURGERY Bilateral    cataracts removed   KNEE ARTHROSCOPY WITH EXCISION PLICA Left 79/10/4095   Procedure: KNEE ARTHROSCOPY WITH EXCISION PLICA;  Surgeon: Marybelle Killings, MD;  Location: [place];  Service:  Orthopedics;  Laterality: Left;   LIGAMENT REPAIR Right 11/28/2021   Procedure: RIGHT THUMB LIGAMENT RECONSTRUCTION AND TENDON INTERPOSITION;  Surgeon: Sherilyn Cooter, MD;  Location: [place];  Service: Orthopedics;  Laterality: Right;   Lower Extremity Arterial Dopplers  07/06/2013   RABI 1.0, LABI 1.1.; No evidence of significant vascular atherogenic plaque   NECK SURGERY     NM MYOVIEW LTD  09/2014   a) LOW RISK. Normal EF of 65%, no RWMA. NO ISCHEMIA OR INFARCTION.;; b) 05/02/2009: Normal study.  LOW RISK.  No ischemia or infarction.  EF 74%.  Breast attenuation noted.   TONSILLECTOMY     TRANSTHORACIC ECHOCARDIOGRAM  03/28/2021   a) Normal EF 60 to 65%.  GR 1 DD.  No R WMA.  Normal RV size and function.  Normal RAP.  Normal valves. => NORMAL;; b) 04/23/16/2010: Normal study.  LOW RISK.  No ischemia or infarction.  EF 74%.  Breast attenuation noted.17/2010: ([facility]) EF>55%.  Normal wall motion.  Normal valves.   WRIST FRACTURE SURGERY     left, has plate   Current Facility-Administered Medications  Medication Dose Route Frequency Provider Last Rate Last Admin   0.9 %  sodium chloride infusion  Intravenous Continuous Mansouraty, Telford Nab., MD        Current Facility-Administered Medications:    0.9 %  sodium chloride infusion, , Intravenous, Continuous, Mansouraty, Telford Nab., MD Allergies  Allergen Reactions   Advil [Ibuprofen] Nausea And Vomiting   Bee Venom Swelling   Codeine Nausea Only and Other (See Comments)    CAUSES ULCERS   Clonidine Derivatives Other (See Comments)    Unknown    Statins Other (See Comments)    unknown   Latex Rash   Levaquin [Levofloxacin] Nausea Only   Propoxyphene N-Acetaminophen Nausea Only   Family History  Problem Relation Age of Onset   Emphysema Father    Dementia Mother    Diabetes Maternal Grandmother    Diabetes Maternal Aunt    Diabetes Other        mat. cousin   Heart disease Brother    Colon cancer Neg Hx    Allergic rhinitis Neg  Hx    Angioedema Neg Hx    Asthma Neg Hx    Atopy Neg Hx    Eczema Neg Hx    Immunodeficiency Neg Hx    Urticaria Neg Hx    Social History   Socioeconomic History   Marital status: Widowed    Spouse name: [name]   Number of children: 2   Years of education: HS   Highest education level: Not on file  Occupational History   Occupation: disable  Tobacco Use   Smoking status: Every Day    Packs/day: 0.50    Years: 39.00    Total pack years: 19.50    Types: Cigarettes    Start date: 08/06/1977   Smokeless tobacco: Never   Tobacco comments:    Patient has cut back to 2-3 per day as of 11/22/21  Vaping Use   Vaping Use: Never used  Substance and Sexual Activity   Alcohol use: No    Alcohol/week: 0.0 standard drinks of alcohol   Drug use: No   Sexual activity: Not Currently    Birth control/protection: Surgical    Comment: Hysterectomy  Other Topics Concern   Not on file  Social History Narrative   Patient lives at home spouse - [name] (Deceased).  Has 2 daughters (3 and 71).   Currently "disabled".   Caffeine Use: tea 4 glasses daily.    Smokes ~1/2 PPD  05/15/15   Walks everyday - no set routine.      [facility] (12/03/16):   Originally from [facility]. 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 Currently has a dog. No bird exposure. No known mold exposure.    Patient is currently disabled.   Social Determinants of Health   Financial Resource Strain: Low Risk  (08/15/2020)   Overall Financial Resource Strain (CARDIA)    Difficulty of Paying Living Expenses: Not hard at all  Food Insecurity: No Food Insecurity (08/15/2020)   Hunger Vital Sign    Worried About Running Out of Food in the Last Year: Never true    Ran Out of Food in the Last Year: Never true  Transportation Needs: No Transportation Needs (08/15/2020)   PRAPARE - Hydrologist (Medical): No    Lack of Transportation (Non-Medical): No  Physical Activity:  Sufficiently Active (08/15/2020)   Exercise Vital Sign    Days of Exercise per Week: 5 days    Minutes of Exercise per Session: 30 min  Stress: Stress Concern Present (08/15/2020)   [employer]  Occupational [employer] - Occupational Stress Questionnaire    Feeling of Stress : Very much  Social Connections: Not on file  Intimate Partner Violence: Not on file    Physical Exam: Today's Vitals   04/04/22 0714  BP: (!) 147/67  Pulse: 62  Resp: 10  Temp: 98 F (36.7 C)  TempSrc: Temporal  SpO2: 98%  Weight: 48.1 kg  Height: '5\' 4"'$  (1.626 m)  PainSc: 0-No pain   Body mass index is 18.19 kg/m. GEN: NAD EYE: Sclerae anicteric ENT: MMM CV: Non-tachycardic GI: Soft, NT/ND NEURO:  Alert & Oriented x 3  Lab Results: No results for input(s): "WBC", "HGB", "HCT", "PLT" in the last 72 hours. BMET No results for input(s): "NA", "K", "CL", "CO2", "GLUCOSE", "BUN", "CREATININE", "CALCIUM" in the last 72 hours. LFT No results for input(s): "PROT", "ALBUMIN", "AST", "ALT", "ALKPHOS", "BILITOT", "BILIDIR", "IBILI" in the last 72 hours. PT/INR No results for input(s): "LABPROT", "INR" in the last 72 hours.   Impression / Plan: This is a 66 y.o.female who presents for EGD/EUS to evaluate biliary duct dilation, and abdominal pain.  The risks of an EUS including intestinal perforation, bleeding, infection, aspiration, and medication effects were discussed as was the possibility it may not give a definitive diagnosis if a biopsy is performed.  When a biopsy of the pancreas is done as part of the EUS, there is an additional risk of pancreatitis at the rate of about 1-2%.  It was explained that procedure related pancreatitis is typically mild, although it can be severe and even life threatening, which is why we do not perform random pancreatic biopsies and only biopsy a lesion/area we feel is concerning enough to warrant the risk.   The risks and benefits of endoscopic evaluation/treatment  were discussed with the patient and/or family; these include but are not limited to the risk of perforation, infection, bleeding, missed lesions, lack of diagnosis, severe illness requiring hospitalization, as well as anesthesia and sedation related illnesses.  The patient's history has been reviewed, patient examined, no change in status, and deemed stable for procedure.  The patient and/or family is agreeable to proceed.    Justice Britain, MD [facility] Office # [phone]

## 2022-04-04 NOTE — Transfer of Care (Signed)
Immediate Anesthesia Transfer of Care Note  Patient: Lori [name]  Procedure(s) Performed: UPPER ENDOSCOPIC ULTRASOUND (EUS) RADIAL BIOPSY  Patient Location: PACU  Anesthesia Type:MAC  Level of Consciousness: awake, oriented and patient cooperative  Airway & Oxygen Therapy: Patient Spontanous Breathing and Patient connected to nasal cannula oxygen  Post-op Assessment: Report given to RN and Post -op Vital signs reviewed and stable  Post vital signs: Reviewed  Last Vitals:  Vitals Value Taken Time  BP 123/63 04/04/22 0936  Temp    Pulse 62 04/04/22 0938  Resp 17 04/04/22 0938  SpO2 100 % 04/04/22 0938  Vitals shown include unvalidated device data.  Last Pain:  Vitals:   04/04/22 0714  TempSrc: Temporal  PainSc: 0-No pain         Complications: No notable events documented.

## 2022-04-04 NOTE — Telephone Encounter (Signed)
Inbound call from patient stating that Dr. Rush Landmark prescribed her 0.9 %  sodium chloride infusion. Patient stated that she went to the pharmacy and it was going to cost her too  much. Patient is requesting an alternative be sent to the pharmacy. Please advise.

## 2022-04-04 NOTE — Telephone Encounter (Signed)
Please advise 

## 2022-04-04 NOTE — Telephone Encounter (Signed)
She does not need that Saline. I only added Levsin to her medication regimen. Please cancel request by pharmacy for saline. Thanks. GM

## 2022-04-04 NOTE — Progress Notes (Addendum)
Brief GI progress note  Patient evaluated in postprocedure PACU. Patient having 10 out of 10 pain in the right upper quadrant midepigastric region.  This is the exact location and the exact type of pain that she has experienced in the past for which she has been undergoing further evaluation and work-up by her PCP and GI provider. My EGD/EUS report is in [place]. I feel the risk of a complication post procedure is extremely unlikely but nothing is impossible thus we need to rule out postprocedural complications of perforation. I did not perform any pancreatic biopsies.  Only endoscopic biopsies were performed of the esophagus/stomach/small bowel.   Pain is out of proportion to what I would normally expect post procedure for this. We will proceed with pain medication fentanyl 25 mcg x 1 to 4 doses scheduled over every 10 to 15 minute up to a total of 100 mcg. She is going to have a stat chest x-ray and KUB performed in evaluation to ensure that we see no evidence of postprocedural perforation. If we cannot control the patient's pain and discomfort she will need a CT scan and will need to come into the hospital for observation. I will update the patient's caretaker/ride to the need for monitoring the patient a bit further before we determine discharge plans. Patient in agreement with this plan and hopes that the pain subsides soon.    Justice Britain, MD [facility] Office # [phone]   P.m. addendum The patient did require a total of 100 mcg of fentanyl.  Patient chest x-ray suggested a left lower lobe scar versus pneumothorax.  KUB was negative.  With the concern for a left lower lobe pneumothorax, repeat imaging was done at 1 hour post chest x-ray and this showed no evidence of any issues. The patient was reevaluated and was eating crackers with peanut butter and after seeing the final chest x-ray result was felt comfortable to be  discharged.   Justice Britain, MD [facility] Office # [phone]

## 2022-04-04 NOTE — Progress Notes (Signed)
Patient resting quietly in recliner chair no signs of distress noted ambulated to without difficulties 02 saturation 98% RA prior to ambulation and 99% after ambulation patient awaiting repeat chest xray

## 2022-04-04 NOTE — Op Note (Signed)
[facility] Patient Name: Lori [name] Procedure Date : 04/04/2022 MRN: [id number] Attending MD: Justice Britain , MD Date of Birth: Aug 28, 1956 CSN: [id number] Age: 66 Admit Type: Outpatient Procedure:                Upper EUS Indications:              Common bile duct dilation (etiology unknown) seen                            on CT scan, Common bile duct dilation (acquired)                            seen on MRCP, Abnormal MRCP, Epigastric abdominal                            pain, Abdominal pain in the right upper quadrant,                            History of previous cholecystectomy Providers:                Justice Britain, MD, Jeanella Cara, RN,                            Luan Moore, Technician, Luciana Axe, CRNA Referring MD:             Estill Cotta. Loletha Carrow, MD, Biagio Borg, MD Medicines:                Monitored Anesthesia Care Complications:            No immediate complications. Estimated Blood Loss:     Estimated blood loss was minimal. Procedure:                Pre-Anesthesia Assessment:                           - Prior to the procedure, a History and Physical                            was performed, and patient medications and                            allergies were reviewed. The patient's tolerance of                            previous anesthesia was also reviewed. The risks                            and benefits of the procedure and the sedation                            options and risks were discussed with the patient.                            All questions were answered, and informed consent  was obtained. Prior Anticoagulants: The patient has                            taken no previous anticoagulant or antiplatelet                            agents. ASA Grade Assessment: III - A patient with                            severe systemic disease. After reviewing the risks                             and benefits, the patient was deemed in                            satisfactory condition to undergo the procedure.                           After obtaining informed consent, the endoscope was                            passed under direct vision. Throughout the                            procedure, the patient's blood pressure, pulse, and                            oxygen saturations were monitored continuously. The                            [id number]-H190 ([id number]) Olympus endoscope was introduced                            through the mouth, and advanced to the second part                            of duodenum. The [id number]-[id number] ([id number]) Olympus                            duodenoscope was introduced through the mouth, and                            advanced to the area of papilla. The [id number]                            ([id number]) Olympus linear ultrasound scope was                            introduced through the mouth, and advanced to the                            duodenum for ultrasound examination from the  stomach and duodenum. The GF-[id number]-AL5 ([id number])                            Olympus Radial EUS scope was introduced through the                            mouth, and advanced to the duodenum for ultrasound                            examination. The upper EUS was accomplished without                            difficulty. The patient tolerated the procedure. Scope In: Scope Out: Findings:      ENDOSCOPIC FINDING: :      No gross lesions were noted in the proximal esophagus and in the mid       esophagus.      The esophagus and gastroesophageal junction were examined with white       light and narrow band imaging (NBI) from a forward view and retroflexed       position. There were esophageal mucosal changes suggestive of       short-segment Barrett's esophagus. These changes involved the mucosa       along an irregular Z-line (34 cm from the incisors). Two  tongues of       salmon-colored mucosa were present from 32 to 34 cm and two tongues of       salmon-colored mucosa were present from 33 to 34 cm. The maximum       longitudinal extent of these esophageal mucosal changes was 2 cm in       length. Biopsies were taken with a cold forceps for histology.      A 4 cm hiatal hernia was present.      Patchy mildly erythematous mucosa without bleeding was found in the       entire examined stomach. Biopsies were taken with a cold forceps for       histology and Helicobacter pylori testing.      No gross lesions were noted in the duodenal bulb, in the first portion       of the duodenum and in the second portion of the duodenum. Biopsies were       taken with a cold forceps for histology.      The major papilla was normal.      ENDOSONOGRAPHIC FINDING: :      An anechoic lesion suggestive of a cyst was identified in the pancreatic       tail. It is not in obvious communication with the pancreatic duct. The       lesion measured 2.1 mm by 2.6 mm in maximal cross-sectional diameter.       There was a single compartment without septae. There was no associated       mass. There was no internal debris within the fluid-filled cavity.      There was no other sign of significant endosonographic abnormality in       the pancreatic head (PD - 2.3 mm), genu of the pancreas (PD - 2.1 mm),       pancreatic body (PD - 0.8 mm) and pancreatic tail (PD - 0.8 mm). No       masses,  no calcifications, the pancreatic duct was regular in contour.      There was dilation in the common bile duct and in the common hepatic       duct which measured up to 3.0 mm -> 15.9 mm. No evidence of stone       disease.      Endosonographic imaging of the ampulla showed no intramural       (subepithelial) lesion. There was echogenicity within the duodenal wall       that made this area only viewable with the radial EUS.      Endosonographic imaging in the visualized portion of the  liver showed no       mass.      No malignant-appearing lymph nodes were visualized in the celiac region       (level 20), peripancreatic region and porta hepatis region.      The celiac region was visualized. Impression:               EGD Impression:                           - No gross lesions in esophagus proximally.                            Esophageal mucosal changes suggestive of                            short-segment Barrett's esophagus - biopsied.                           - 4 cm hiatal hernia.                           - Erythematous mucosa in the stomach. Biopsied.                           - No gross lesions in the duodenal bulb, in the                            first portion of the duodenum and in the second                            portion of the duodenum. Biopsied.                           - Normal major papilla.                           EUS Impression:                           - A very small cystic lesion was seen in the                            pancreatic tail. Tissue has not been obtained.                            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                           - There was no sign of other significant pathology                            in the pancreatic head, genu of the pancreas,                            pancreatic body and pancreatic tail.                           - There was dilation in the common bile duct and in                            the common hepatic duct which measured 3.0 mm ->                            15.9 mm. No evidence of any stone disease.                           - No malignant-appearing lymph nodes were                            visualized in the celiac region (level 20),                            peripancreatic region and porta hepatis region. Recommendation:           - The patient will  be observed post-procedure,                            until all discharge criteria are met.                           - Discharge patient to home.                           - Patient has a contact number available for                            emergencies. The signs and symptoms of potential                            delayed complications were discussed with the                            patient. Return to normal activities tomorrow.  Written discharge instructions were provided to the                            patient.                           - Resume previous diet.                           - Observe patient's clinical course.                           - Await path results.                           - If patient has another episode of significant                            pain/discomfort would recommend HFP/Amylase/Lipase                            be performed within 6-12 hours to see if number                            increase at which point, there would consideration                            of ERCP for SOD-3 sphincterotomy.                           - The findings and recommendations were discussed                            with the patient.                           - The findings and recommendations were discussed                            with the patient's family. Procedure Code(s):        --- Professional ---                           [id number], Esophagogastroduodenoscopy, flexible,                            transoral; with endoscopic ultrasound examination                            limited to the esophagus, stomach or duodenum, and                            adjacent structures                           43239, Esophagogastroduodenoscopy, flexible,  transoral; with biopsy, single or multiple Diagnosis Code(s):        --- Professional ---                           K22.8, Other specified diseases of esophagus                            K44.9, Diaphragmatic hernia without obstruction or                            gangrene                           K31.89, Other diseases of stomach and duodenum                           K86.2, Cyst of pancreas                           K83.8, Other specified diseases of biliary tract                           I89.9, Noninfective disorder of lymphatic vessels                            and lymph nodes, unspecified                           R93.2, Abnormal findings on diagnostic imaging of                            liver and biliary tract                           R10.13, Epigastric pain                           R10.11, Right upper quadrant pain CPT copyright 2019 American Medical Association. All rights reserved. The codes documented in this report are preliminary and upon coder review may  be revised to meet current compliance requirements. Justice Britain, MD 04/04/2022 10:13:40 AM Number of Addenda: 0

## 2022-04-04 NOTE — Anesthesia Postprocedure Evaluation (Signed)
Anesthesia Post Note  Patient: Lori [name]  Procedure(s) Performed: UPPER ENDOSCOPIC ULTRASOUND (EUS) RADIAL BIOPSY     Patient location during evaluation: PACU Anesthesia Type: MAC Level of consciousness: awake and alert Pain management: pain level controlled Vital Signs Assessment: post-procedure vital signs reviewed and stable Respiratory status: spontaneous breathing and respiratory function stable Cardiovascular status: stable Postop Assessment: no apparent nausea or vomiting Anesthetic complications: no   No notable events documented.  Last Vitals:  Vitals:   04/04/22 1035 04/04/22 1050  BP: 127/71 125/68  Pulse: (!) 56 (!) 51  Resp: 20 15  Temp:  (!) 36.4 C  SpO2: 95% 96%    Last Pain:  Vitals:   04/04/22 1050  TempSrc:   PainSc: 4                  Dorothye Berni DANIEL

## 2022-04-04 NOTE — Anesthesia Procedure Notes (Signed)
Procedure Name: MAC Date/Time: 04/04/2022 8:35 AM  Performed by: Jenne Campus, CRNAPre-anesthesia Checklist: Patient identified, Emergency Drugs available, Suction available and Patient being monitored Oxygen Delivery Method: Nasal cannula

## 2022-04-05 ENCOUNTER — Encounter: Payer: Self-pay | Admitting: Gastroenterology

## 2022-04-05 LAB — SURGICAL PATHOLOGY

## 2022-04-05 NOTE — Telephone Encounter (Signed)
I spoke with the pt and she tells me that she has been in pain all night.  When asked to describe her pain she tells me that she just hurts and has hurt all night, "I just need something for pain".  She was not able to describe any symptoms just "I hurt"  Please advise

## 2022-04-05 NOTE — Telephone Encounter (Signed)
I called and spoke with the patient this afternoon when I returned to the office since I was off this morning. The patient states that she was able to stay with her sister all night yesterday into this morning.  She has had dinner as well as breakfast and lunch.  For breakfast she had bacon and eggs.  She is tolerating that. The discomfort she is experiencing is epigastric and right upper quadrant with persisting sharpness.  It was not exacerbated by food intake.  This is the exact type of discomfort that she has been experiencing on/off for months to years. As documented we ended up doing yesterday a KUB and chest x-ray and a repeat chest x-ray because of a question of scarring versus pneumothorax but the KUB was unremarkable. She is passing gas and having bowel movements. Its not clear to me why she is still experiencing this discomfort based on just performing biopsies of the mucosal track but certainly we need to consider the potential of a complication from the procedure versus an exacerbation of potential functional abdominal pain. The patient was going to initiate Levsin which was prescribed yesterday but she had not taken as of yet. The patient understands that if her pain progresses/worsens or does not improve over the next few hours or into the weekend that she needs to go to the emergency department and call us to let us know.  I did offer her an opportunity to come in and get labs and have an expedited CT scan performed but she defers on this currently and wants to try the medicines.  She understands that we could be missing something such as a microperforation (less likely with the negative work-up from yesterday) but not impossible. She will update the team over the weekend if she ends up feeling worse or will need to come into the emergency department. I will have my team reach out to her next week to see where things stand. If she is still experiencing discomfort, did not go to the  emergency department over the weekend, then she will need to have amylase/lipase/CBC/CMP performed as well as an expedited CT abdomen pelvis with IV and oral contrast to exclude postprocedural complications.  Patty, Please reach out to her next week. Thanks. GM   FYI HD (covering the hospital this weekend and also patient's primary GI.

## 2022-04-05 NOTE — Telephone Encounter (Signed)
Gabe,  Thank you for doing her EUS yesterday to investigate the biliary ductal dilatation.  Notwithstanding that she had an endoscopic procedure yesterday, it should be noted that this patient has many years of chronic functional abdominal pain and bloating. It is fine to try the Levsin, but I had previously not used it mainly because she has chronic constipation as well.  Note that you were planning to do so based on your note, but I recommend against prescribing opioids for this patient.  HD

## 2022-04-07 ENCOUNTER — Encounter (HOSPITAL_COMMUNITY): Payer: Self-pay | Admitting: Gastroenterology

## 2022-04-08 NOTE — Telephone Encounter (Signed)
Thank you for update. GM 

## 2022-04-08 NOTE — Telephone Encounter (Signed)
I spoke with the pt this morning and she tells me she feels much better.  She has no pain and states that the Levsin is working very well.  She will let is know if her symptoms return.

## 2022-04-09 ENCOUNTER — Telehealth: Payer: Self-pay

## 2022-04-09 ENCOUNTER — Telehealth: Payer: Self-pay | Admitting: Internal Medicine

## 2022-04-09 NOTE — Telephone Encounter (Signed)
Pt had a biopsy on 04/04/22 and would like a callback going over the results.   Please advise.   CB: [phone]

## 2022-04-09 NOTE — Telephone Encounter (Signed)
Left message for patient to notify that she would need to contact the provider who ordered the biopsy to discuss results.

## 2022-04-09 NOTE — Telephone Encounter (Signed)
-----   Message from [place], MD sent at 04/07/2022  4:54 PM EDT ----- Regarding: EGD recall This patient of mine had a recent EGD and EUS with Dr. Rush Landmark.  Short segment nondysplastic Barrett's esophagus was discovered.  Based on current guidelines, please place a recall for an EGD in 5 years.  - HD

## 2022-04-09 NOTE — Telephone Encounter (Signed)
I called pt to review pathology results. She will receive a letter from our office as well. Thanks

## 2022-04-09 NOTE — Telephone Encounter (Signed)
Called and spoke with patient. We reviewed her pathology results, I read the letter from Dr. Rush Landmark. Pt is aware that seh will receive a letter in the mail within the next week or so with results. Pt is aware that she will need repeat EGD in 5 years and will receive a reminder letter closer to that time. Pt has been advised to continue Dexilant. Pt has been scheduled for a hospital follow up with Dr. Loletha Carrow on Tuesday, 05/07/22 at 10:20 am. I provided pt with the office phone # and address. Pt verbalized understanding and had no concerns at the end of the call.   Letter with appt information mailed to patient. 5-year EGD recall in epic.

## 2022-04-10 ENCOUNTER — Other Ambulatory Visit: Payer: Self-pay | Admitting: Internal Medicine

## 2022-04-10 NOTE — Telephone Encounter (Signed)
Patient notified

## 2022-04-10 NOTE — Telephone Encounter (Signed)
Already done July 26

## 2022-04-10 NOTE — Telephone Encounter (Signed)
Patient needs her antivert called in to [facility] on [address]  Next Visit 06/25/2022  Last Visit:  02/02/2022

## 2022-04-11 ENCOUNTER — Telehealth: Payer: Self-pay | Admitting: Internal Medicine

## 2022-04-11 NOTE — Telephone Encounter (Signed)
Pt called and stated she went to the pharmacy([facility] [phone]) to pick up her medications. She said they would not give her meclizine (ANTIVERT) 25 MG tablet. She said they advised her that she does not have any refills and that she must contact her pcp to get a refill. Pt is requesting a callback.    Please advise   CB: [phone]

## 2022-04-11 NOTE — Telephone Encounter (Signed)
Please advise on Meclizine prescription

## 2022-04-11 NOTE — Telephone Encounter (Signed)
I have sent this last pm, should be ok now I hope

## 2022-04-11 NOTE — Telephone Encounter (Signed)
Patient notified

## 2022-04-17 NOTE — Telephone Encounter (Signed)
error 

## 2022-04-23 ENCOUNTER — Ambulatory Visit: Payer: Self-pay | Admitting: Licensed Clinical Social Worker

## 2022-04-23 NOTE — Patient Instructions (Addendum)
Visit Information  Thank you for taking time to visit with me today. Please don't hesitate to contact me if I can be of assistance to you before our next scheduled telephone appointment.  Following are the goals we discussed today:   Our next appointment is by telephone on 05/14/22 at 10:00 AM   Please call the care guide team at [phone] if you need to cancel or reschedule your appointment.   If you are experiencing a Mental Health or [place] or need someone to talk to, please go to [facility] [place] [phone])   Following is a copy of your full plan of care:   Care Coordination Interventions:  Depression screen reviewed  Active Listening utilized with client Discussed in home care needs of client (she needs some help with ADLs and with cleaning) Client spoke of difficulty holding items in her hand. This affects her daily activities Discussed medication procurement of client Discussed food procurement. She has [employer] U card he uses for food and to help pay utility costs Discussed ambulation of client. She said she gets dizzy when walking Discussed family support. She has some support from her daughters Discussed financial needs of client.  Discussed Dual complete program with [facility]  Ms. Klahr was given information about Care Management services by the embedded care coordination team including:  Care Management services include personalized support from designated clinical staff supervised by her physician, including individualized plan of care and coordination with other care providers 24/7 contact phone numbers for assistance for urgent and routine care needs. The patient may stop CCM services at any time (effective at the end of the month) by phone call to the office staff.  Patient agreed to services and verbal consent obtained.   Norva Riffle.Dea Bitting MSW, [facility] Holiday representative [facility] Care  Management [phone]

## 2022-04-23 NOTE — Patient Outreach (Signed)
  Care Coordination   Initial Visit Note   04/23/2022 Name: LASHAUNDA SCHILD MRN: [id number] DOB: Jan 26, 1956  JAELYNNE HOCKLEY is a 66 y.o. year old female who sees Biagio Borg, MD for primary care. I spoke with  [name] by phone today  What matters to the patients health and wellness today?  Client wants to seek some in home help assistance with activities. She has functional challenges with her hands. She would like in home support occasionally with cleaning, cooking and helping her with ADLs    Goals Addressed             This Visit's Progress    Patient Stated       Care Coordination Interventions:  Depression screen reviewed  Active Listening utilized with client Discussed in home care needs of client (she needs some help with ADLs and with cleaning) Client spoke of difficulty holding items in her hand. This affects her daily activities Discussed medication procurement of client Discussed food procurement. She has [employer] U card she uses for food and to help pay utility costs Discussed ambulation of client. She said she gets dizzy when walking Discussed family support. She has some support from her daughters Discussed financial needs of client.  Discussed Dual complete program with [facility]      SDOH assessments and interventions completed:  Yes  SDOH Interventions Today    Flowsheet Row Most Recent Value  SDOH Interventions   Stress Interventions Provide Counseling  [stress related to managing medical needs]  Depression Interventions/Treatment  Medication, Counseling       Care Coordination Interventions Activated:  Yes  Care Coordination Interventions:  Yes, provided   Follow up plan: Follow up call scheduled for 05/14/22 at 10:00 AM     Encounter Outcome:  Pt. Visit Completed

## 2022-04-30 ENCOUNTER — Ambulatory Visit (INDEPENDENT_AMBULATORY_CARE_PROVIDER_SITE_OTHER): Payer: Medicare Other | Admitting: *Deleted

## 2022-04-30 DIAGNOSIS — J309 Allergic rhinitis, unspecified: Secondary | ICD-10-CM

## 2022-04-30 DIAGNOSIS — G43719 Chronic migraine without aura, intractable, without status migrainosus: Secondary | ICD-10-CM | POA: Diagnosis not present

## 2022-05-03 ENCOUNTER — Ambulatory Visit (INDEPENDENT_AMBULATORY_CARE_PROVIDER_SITE_OTHER): Payer: Medicare Other

## 2022-05-03 ENCOUNTER — Ambulatory Visit (INDEPENDENT_AMBULATORY_CARE_PROVIDER_SITE_OTHER): Payer: Medicare Other | Admitting: Internal Medicine

## 2022-05-03 ENCOUNTER — Other Ambulatory Visit: Payer: Self-pay | Admitting: Internal Medicine

## 2022-05-03 ENCOUNTER — Encounter: Payer: Self-pay | Admitting: Internal Medicine

## 2022-05-03 VITALS — BP 118/66 | HR 62 | Temp 98.1°F | Ht 64.0 in | Wt 114.0 lb

## 2022-05-03 DIAGNOSIS — M545 Low back pain, unspecified: Secondary | ICD-10-CM | POA: Diagnosis not present

## 2022-05-03 DIAGNOSIS — N179 Acute kidney failure, unspecified: Secondary | ICD-10-CM

## 2022-05-03 DIAGNOSIS — W19XXXA Unspecified fall, initial encounter: Secondary | ICD-10-CM

## 2022-05-03 DIAGNOSIS — E559 Vitamin D deficiency, unspecified: Secondary | ICD-10-CM | POA: Diagnosis not present

## 2022-05-03 DIAGNOSIS — M25551 Pain in right hip: Secondary | ICD-10-CM | POA: Diagnosis not present

## 2022-05-03 DIAGNOSIS — M25552 Pain in left hip: Secondary | ICD-10-CM | POA: Diagnosis not present

## 2022-05-03 DIAGNOSIS — Z720 Tobacco use: Secondary | ICD-10-CM | POA: Diagnosis not present

## 2022-05-03 LAB — HEPATIC FUNCTION PANEL
ALT: 11 U/L (ref 0–35)
AST: 15 U/L (ref 0–37)
Albumin: 4.3 g/dL (ref 3.5–5.2)
Alkaline Phosphatase: 68 U/L (ref 39–117)
Bilirubin, Direct: 0.1 mg/dL (ref 0.0–0.3)
Total Bilirubin: 0.5 mg/dL (ref 0.2–1.2)
Total Protein: 6.3 g/dL (ref 6.0–8.3)

## 2022-05-03 LAB — CBC WITH DIFFERENTIAL/PLATELET
Basophils Absolute: 0 10*3/uL (ref 0.0–0.1)
Basophils Relative: 0.1 % (ref 0.0–3.0)
Eosinophils Absolute: 0 10*3/uL (ref 0.0–0.7)
Eosinophils Relative: 0 % (ref 0.0–5.0)
HCT: 40.6 % (ref 36.0–46.0)
Hemoglobin: 13.5 g/dL (ref 12.0–15.0)
Lymphocytes Relative: 34.6 % (ref 12.0–46.0)
Lymphs Abs: 2.2 10*3/uL (ref 0.7–4.0)
MCHC: 33.1 g/dL (ref 30.0–36.0)
MCV: 94.3 fl (ref 78.0–100.0)
Monocytes Absolute: 0.5 10*3/uL (ref 0.1–1.0)
Monocytes Relative: 7.9 % (ref 3.0–12.0)
Neutro Abs: 3.7 10*3/uL (ref 1.4–7.7)
Neutrophils Relative %: 57.4 % (ref 43.0–77.0)
Platelets: 249 10*3/uL (ref 150.0–400.0)
RBC: 4.31 Mil/uL (ref 3.87–5.11)
RDW: 14.4 % (ref 11.5–15.5)
WBC: 6.4 10*3/uL (ref 4.0–10.5)

## 2022-05-03 LAB — BASIC METABOLIC PANEL
BUN: 11 mg/dL (ref 6–23)
CO2: 28 mEq/L (ref 19–32)
Calcium: 9 mg/dL (ref 8.4–10.5)
Chloride: 105 mEq/L (ref 96–112)
Creatinine, Ser: 0.87 mg/dL (ref 0.40–1.20)
GFR: 69.77 mL/min (ref >60.00)
Glucose, Bld: 85 mg/dL (ref 70–99)
Potassium: 4.5 mEq/L (ref 3.5–5.1)
Sodium: 139 mEq/L (ref 135–145)

## 2022-05-03 LAB — URINALYSIS, ROUTINE W REFLEX MICROSCOPIC
Bilirubin Urine: NEGATIVE
Hgb urine dipstick: NEGATIVE
Ketones, ur: NEGATIVE
Leukocytes,Ua: NEGATIVE
Nitrite: NEGATIVE
Specific Gravity, Urine: 1.01 (ref 1.000–1.030)
Total Protein, Urine: NEGATIVE
Urine Glucose: NEGATIVE
Urobilinogen, UA: 0.2 (ref 0.0–1.0)
pH: 7 (ref 5.0–8.0)

## 2022-05-03 MED ORDER — METHOCARBAMOL 500 MG PO TABS: 500.0000 mg | ORAL_TABLET | Freq: Four times a day (QID) | ORAL | 2 refills | Status: DC

## 2022-05-03 MED ORDER — TRAMADOL HCL 50 MG PO TABS: 50.0000 mg | ORAL_TABLET | Freq: Four times a day (QID) | ORAL | 1 refills | Status: DC | PRN

## 2022-05-03 NOTE — Patient Instructions (Signed)
Please take all new medication as prescribed - the new muscle relaxer as needed  Please continue all other medications as before, including the tramadol as needed  Please have the pharmacy call with any other refills you may need.  Please keep your appointments with your specialists as you may have planned  Please go to the XRAY Department in the first floor for the x-ray testing  Please go to the LAB at the blood drawing area for the tests to be done  You will be contacted by phone if any changes need to be made immediately.  Otherwise, you will receive a letter about your results with an explanation, but please check with MyChart first.  Please remember to sign up for MyChart if you have not done so, as this will be important to you in the future with finding out test results, communicating by private email, and scheduling acute appointments online when needed.

## 2022-05-03 NOTE — Progress Notes (Unsigned)
Patient ID: Lori [name], female   DOB: 04-Oct-1955, 65 y.o.   MRN: [id number]        Chief Complaint: follow up low back pain       HPI:  Lori [name] is a 66 y.o. female here with c/o slip and fall x 2 daysin the BR, now with mod to severe low back pain, mostly left sided paravertebral, without LE symptoms related.  Flexeril not working well.  Did have mild renal slowing last visit.  Pt denies chest pain, increased sob or doe, wheezing, orthopnea, PND, increased LE swelling, palpitations, dizziness or syncope.  Pt denies polydipsia, polyuria, or new focal neuro s/s.    Pt denies fever, wt loss, night sweats, loss of appetite, or other constitutional symptoms         Wt Readings from Last 3 Encounters:  05/03/22 114 lb (51.7 kg)  04/04/22 106 lb (48.1 kg)  03/15/22 110 lb (49.9 kg)   BP Readings from Last 3 Encounters:  05/03/22 118/66  04/04/22 125/68  03/15/22 (!) 125/58         Past Medical History:  Diagnosis Date   Allergic rhinitis    Anemia    hgb 13.9 on 11/22/21   Ankle fracture 05/2016   Anxiety    Arthritis    Asthma    Barrett esophagus 2007   Chest pain    Hx, no current problems as of 11/22/21.  Recurrent episodes.  Has had 3 negative nuclear stress test, and a completely normal coronary CT angiogram   COPD (chronic obstructive pulmonary disease) with chronic bronchitis (HCC)    & Emphysema   Depression    Duodenitis without mention of hemorrhage 2007   Esophageal reflux 2007   Esophageal stricture    Full dentures    patient does not wear dentures as of 11/22/21   H/O hiatal hernia    Hiatal hernia 2006,2007   HSV infection    Hyperlipidemia    rx crestor but pt not taking   Multiple fractures    from falls, fx rt. elbow, fx left wrist, bilateral ankles   Pneumonia 10/2016   Restrictive lung disease 06/30/2017   Ulcer    no current problems as of 11/22/21   Past Surgical History:  Procedure Laterality Date   ABDOMINAL HYSTERECTOMY     BSO    APPENDECTOMY     BIOPSY  04/04/2022   Procedure: BIOPSY;  Surgeon: Irving Copas., MD;  Location: [facility];  Service: Gastroenterology;;   CESAREAN SECTION     x 2   CHOLECYSTECTOMY     CHONDROPLASTY Left 01/06/2015   Procedure: CHONDROPLASTY;  Surgeon: Marybelle Killings, MD;  Location: [facility];  Service: Orthopedics;  Laterality: Left;   COLONOSCOPY     CT CTA CORONARY W/CA SCORE W/CM &/OR WO/CM  12/02/2016   CORONARY CALCIUM SCORE 0.  NO EVIDENCE OF CAD.  Normal-sized PA.  Normal pulmonary venous drainage the left atrium.  No ASD or VSD.   ELBOW FRACTURE SURGERY     right   ESOPHAGOGASTRODUODENOSCOPY (EGD) WITH PROPOFOL N/A 04/04/2022   Procedure: ESOPHAGOGASTRODUODENOSCOPY (EGD) WITH PROPOFOL;  Surgeon: Rush Landmark Telford Nab., MD;  Location: [facility];  Service: Gastroenterology;  Laterality: N/A;   EUS N/A 04/04/2022   Procedure: UPPER ENDOSCOPIC ULTRASOUND (EUS) RADIAL;  Surgeon: Irving Copas., MD;  Location: [facility];  Service: Gastroenterology;  Laterality: N/A;   EYE SURGERY Bilateral    cataracts removed   KNEE  ARTHROSCOPY WITH EXCISION PLICA Left 75/91/6384   Procedure: KNEE ARTHROSCOPY WITH EXCISION PLICA;  Surgeon: Marybelle Killings, MD;  Location: [facility];  Service: Orthopedics;  Laterality: Left;   LIGAMENT REPAIR Right 11/28/2021   Procedure: RIGHT THUMB LIGAMENT RECONSTRUCTION AND TENDON INTERPOSITION;  Surgeon: Sherilyn Cooter, MD;  Location: [employer] [facility];  Service: Orthopedics;  Laterality: Right;   Lower Extremity Arterial Dopplers  07/06/2013   RABI 1.0, LABI 1.1.; No evidence of significant vascular atherogenic plaque   NECK SURGERY     NM MYOVIEW LTD  09/2014   a) LOW RISK. Normal EF of 65%, no RWMA. NO ISCHEMIA OR INFARCTION.;; b) 05/02/2009: Normal study.  LOW RISK.  No ischemia or infarction.  EF 74%.  Breast attenuation noted.   TONSILLECTOMY     TRANSTHORACIC ECHOCARDIOGRAM  03/28/2021   a) Normal EF 60 to 65%.   GR 1 DD.  No R WMA.  Normal RV size and function.  Normal RAP.  Normal valves. => NORMAL;; b) 04/23/16/2010: Normal study.  LOW RISK.  No ischemia or infarction.  EF 74%.  Breast attenuation noted.17/2010: ([facility]) EF>55%.  Normal wall motion.  Normal valves.   WRIST FRACTURE SURGERY     left, has plate    reports that she has been smoking cigarettes. She started smoking about 44 years ago. She has a 19.50 pack-year smoking history. She has never used smokeless tobacco. She reports that she does not drink alcohol and does not use drugs. family history includes Dementia in her mother; Diabetes in her maternal aunt, maternal grandmother, and another family member; Emphysema in her father; Heart disease in her brother. Allergies  Allergen Reactions   Advil [Ibuprofen] Nausea And Vomiting   Bee Venom Swelling   Codeine Nausea Only and Other (See Comments)    CAUSES ULCERS   Clonidine Derivatives Other (See Comments)    Unknown    Statins Other (See Comments)    unknown   Latex Rash   Levaquin [Levofloxacin] Nausea Only   Propoxyphene N-Acetaminophen Nausea Only   Current Outpatient Medications on File Prior to Visit  Medication Sig Dispense Refill   AIMOVIG 140 MG/ML SOAJ Inject 140 mg into the skin every 30 (thirty) days.     ALPRAZolam (XANAX) 1 MG tablet TAKE 1 TABLET BY MOUTH AT BEDTIME AS NEEDED FOR ANXIETY (Patient taking differently: Take 1 mg by mouth daily as needed for anxiety.) 90 tablet 1   benzonatate (TESSALON) 200 MG capsule Take 1 capsule (200 mg total) by mouth 2 (two) times daily as needed for cough. (Patient not taking: Reported on 04/03/2022) 20 capsule 0   Budeson-Glycopyrrol-Formoterol (BREZTRI AEROSPHERE) 160-9-4.8 MCG/ACT AERO Inhale 2 puffs once a day with spacer to help prevent cough and wheeze. 10.7 g 5   cyclobenzaprine (FLEXERIL) 5 MG tablet Take 5 mg by mouth 3 (three) times daily as needed for muscle spasms.     DEXILANT 60 MG capsule Take 1 capsule (60 mg total) by  mouth daily. (Patient taking differently: Take 60 mg by mouth daily before breakfast.) 90 capsule 1   EPINEPHrine 0.3 mg/0.3 mL IJ SOAJ injection INJECT AS DIRECTED FOR SEVERE ALLERGIC REACTION 2 each 1   FASENRA 30 MG/ML SOSY INJECT '30MG'$  SUBCUTANEOUSLY  EVERY 8 WEEKS 1 mL 6   gabapentin (NEURONTIN) 300 MG capsule Take 300 mg by mouth in the morning.     gabapentin (NEURONTIN) 600 MG tablet Take by mouth.     guaiFENesin-dextromethorphan (ROBITUSSIN DM) 100-10 MG/5ML syrup  Take 5 mLs by mouth every 4 (four) hours as needed for cough. (Patient not taking: Reported on 04/03/2022) 118 mL 0   Hyoscyamine Sulfate SL (LEVSIN/SL) 0.125 MG SUBL Place 0.125 mg under the tongue 3 (three) times daily as needed. Would begin by taking once daily in AM.  If episode of pain/discomfort arises, then use another for up to 3 tablets a day total 90 tablet 2   meclizine (ANTIVERT) 12.5 MG tablet Take 12.5 mg by mouth 3 (three) times daily as needed for dizziness.     meclizine (ANTIVERT) 25 MG tablet TAKE 1 TABLET BY MOUTH THREE TIMES DAILY AS NEEDED FOR DIZZINESS 40 tablet 0   montelukast (SINGULAIR) 10 MG tablet Take 1 tablet (10 mg total) by mouth at bedtime. (Patient taking differently: Take 10 mg by mouth in the morning.) 90 tablet 2   nitroGLYCERIN (NITROSTAT) 0.4 MG SL tablet Place 1 tablet (0.4 mg total) under the tongue every 5 (five) minutes as needed for chest pain. 25 tablet 1   ondansetron (ZOFRAN) 4 MG tablet Take 1 tablet (4 mg total) by mouth every 8 (eight) hours as needed for nausea or vomiting. (Patient not taking: Reported on 04/03/2022) 20 tablet 0   PARoxetine (PAXIL) 10 MG tablet Take 1 tablet (10 mg total) by mouth daily. (Patient not taking: Reported on 04/03/2022) 90 tablet 3   predniSONE (STERAPRED UNI-PAK 21 TAB) 10 MG (21) TBPK tablet Take by mouth daily. (Patient not taking: Reported on 04/03/2022) 21 tablet 0   promethazine (PHENERGAN) 25 MG tablet Take by mouth.     tiZANidine (ZANAFLEX) 2 MG  tablet Take 1 tablet (2 mg total) by mouth every 8 (eight) hours as needed for muscle spasms. (Patient not taking: Reported on 04/03/2022) 21 tablet 0   valACYclovir (VALTREX) 1000 MG tablet Take 1 tablet by mouth twice daily (Patient taking differently: Take 1,000 mg by mouth in the morning.) 180 tablet 1   Current Facility-Administered Medications on File Prior to Visit  Medication Dose Route Frequency Provider Last Rate Last Admin   Benralizumab SOSY 30 mg  30 mg Subcutaneous Q8 weeks Valentina Shaggy, MD   30 mg at 03/27/22 0854        ROS:  All others reviewed and negative.  Objective        PE:  BP 118/66 (BP Location: Right Arm, Patient Position: Sitting, Cuff Size: Large)   Pulse 62   Temp 98.1 F (36.7 C) (Oral)   Ht '5\' 4"'$  (1.626 m)   Wt 114 lb (51.7 kg)   SpO2 97%   BMI 19.57 kg/m                 Constitutional: Pt appears in NAD               HENT: Head: NCAT.                Right Ear: External ear normal.                 Left Ear: External ear normal.                Eyes: . Pupils are equal, round, and reactive to light. Conjunctivae and EOM are normal               Nose: without d/c or deformity               Neck: Neck supple. Gross normal ROM  Cardiovascular: Normal rate and regular rhythm.                 Pulmonary/Chest: Effort normal and breath sounds without rales or wheezing.                Abd:  Soft, NT, ND, + BS, no organomegaly               Spine diffuse tender in midlie and left paravertebral with spasm               Neurological: Pt is alert. At baseline orientation, motor grossly intact               Skin: Skin is warm. No rashes, no other new lesions, LE edema - none               Psychiatric: Pt behavior is normal without agitation   Micro: none  Cardiac tracings I have personally interpreted today:  none  Pertinent Radiological findings (summarize): none   Lab Results  Component Value Date   WBC 6.4 05/03/2022   HGB 13.5  05/03/2022   HCT 40.6 05/03/2022   PLT 249.0 05/03/2022   GLUCOSE 85 05/03/2022   CHOL 246 (H) 01/10/2022   TRIG 328.0 (H) 01/10/2022   HDL 41.40 01/10/2022   LDLDIRECT 165.0 01/10/2022   LDLCALC Comment 03/12/2017   ALT 11 05/03/2022   AST 15 05/03/2022   NA 139 05/03/2022   K 4.5 05/03/2022   CL 105 05/03/2022   CREATININE 0.87 05/03/2022   BUN 11 05/03/2022   CO2 28 05/03/2022   TSH 1.42 01/10/2022   INR 0.9 08/31/2008   HGBA1C 5.3 01/10/2022   Assessment/Plan:  Lori [name] is a 66 y.o. White or Caucasian [1] female with  has a past medical history of Allergic rhinitis, Anemia, Ankle fracture (05/2016), Anxiety, Arthritis, Asthma, Barrett esophagus (2007), Chest pain, COPD (chronic obstructive pulmonary disease) with chronic bronchitis ([place]), Depression, Duodenitis without mention of hemorrhage (2007), Esophageal reflux (2007), Esophageal stricture, Full dentures, H/O hiatal hernia, Hiatal hernia (8366,2947), HSV infection, Hyperlipidemia, Multiple fractures, Pneumonia (10/2016), Restrictive lung disease (06/30/2017), and Ulcer.  Low back pain Post fall, for plain ls spine films, also change flexeril to robaxin, tramadol prn,  to f/u any worsening symptoms or concerns  Tobacco abuse Pt counsled to quit, pt not ready  AKI (acute kidney injury) ([place]) Also for f/u lab,  to f/u any worsening symptoms or concerns  Vitamin D deficiency Last vitamin D Lab Results  Component Value Date   VD25OH 22.86 (L) 01/10/2022   Low, to start oral replacement  Followup: Return if symptoms worsen or fail to improve.  Cathlean Cower, MD 05/04/2022 10:04 PM [facility]

## 2022-05-04 ENCOUNTER — Encounter: Payer: Self-pay | Admitting: Internal Medicine

## 2022-05-04 NOTE — Assessment & Plan Note (Signed)
Post fall, for plain ls spine films, also change flexeril to robaxin, tramadol prn,  to f/u any worsening symptoms or concerns

## 2022-05-04 NOTE — Assessment & Plan Note (Signed)
Pt counsled to quit, pt not ready °

## 2022-05-04 NOTE — Assessment & Plan Note (Signed)
Last vitamin D Lab Results  Component Value Date   VD25OH 22.86 (L) 01/10/2022   Low, to start oral replacement

## 2022-05-04 NOTE — Assessment & Plan Note (Signed)
Also for f/u lab,  to f/u any worsening symptoms or concerns

## 2022-05-06 ENCOUNTER — Encounter: Payer: Self-pay | Admitting: Internal Medicine

## 2022-05-06 ENCOUNTER — Telehealth: Payer: Self-pay | Admitting: Internal Medicine

## 2022-05-06 NOTE — Telephone Encounter (Signed)
Pt is requesting a call to get her lab and imaging results from 05/03/22.    [phone]

## 2022-05-06 NOTE — Telephone Encounter (Signed)
Pt is calling wanting to know results for Labs and images done on 05/03/22.  Please advise

## 2022-05-07 ENCOUNTER — Encounter: Payer: Self-pay | Admitting: Gastroenterology

## 2022-05-07 ENCOUNTER — Ambulatory Visit (INDEPENDENT_AMBULATORY_CARE_PROVIDER_SITE_OTHER): Payer: Medicare Other | Admitting: Gastroenterology

## 2022-05-07 VITALS — BP 120/60 | HR 83 | Ht 62.0 in | Wt 110.0 lb

## 2022-05-07 DIAGNOSIS — K2270 Barrett's esophagus without dysplasia: Secondary | ICD-10-CM

## 2022-05-07 DIAGNOSIS — K219 Gastro-esophageal reflux disease without esophagitis: Secondary | ICD-10-CM | POA: Diagnosis not present

## 2022-05-07 DIAGNOSIS — K862 Cyst of pancreas: Secondary | ICD-10-CM

## 2022-05-07 DIAGNOSIS — R1084 Generalized abdominal pain: Secondary | ICD-10-CM

## 2022-05-07 DIAGNOSIS — K449 Diaphragmatic hernia without obstruction or gangrene: Secondary | ICD-10-CM

## 2022-05-07 DIAGNOSIS — K838 Other specified diseases of biliary tract: Secondary | ICD-10-CM | POA: Diagnosis not present

## 2022-05-07 NOTE — Progress Notes (Signed)
[facility] Progress Note  Chief Complaint: Biliary ductal dilatation and Barrett's esophagus  Subjective  History: Lori [name] has chronic functional upper abdominal pain with altered bowel habits.  She recently underwent EUS by Dr. Rush Landmark for biliary ductal dilatation on imaging with normal LFTs.  There was a small benign-appearing distal pancreatic cystic lesion felt likely to be a sidebranch IPMN.  Exam was otherwise notable for a hiatal hernia and a 2 cm segment of nondysplastic Barrett's.  No H. pylori, no celiac sprue.  (5-year EGD recall for Barrett's was placed after biopsy results) She complained of pain after her procedure and was prescribed hyoscyamine.  Since then, Lori [name] was evaluated by Dr. Jenny Reichmann for back pain after recent fall, x-ray as noted below with chronic stable fracture and arthritic thoracolumbar changes. _____________  Lori [name] says that Dexilant is not helping her heartburn much.  She continues to smoke, and presently is most troubled by severe back pain.  She is in a lot of pain walking and says she cannot get onto the exam table as a result.  ROS: Cardiovascular:  no chest pain Respiratory: no dyspnea  The patient's Past Medical, Family and Social History were reviewed and are on file in the EMR.  Objective:  Med list reviewed  Current Outpatient Medications:    AIMOVIG 140 MG/ML SOAJ, Inject 140 mg into the skin every 30 (thirty) days., Disp: , Rfl:    ALPRAZolam (XANAX) 1 MG tablet, TAKE 1 TABLET BY MOUTH AT BEDTIME AS NEEDED FOR ANXIETY (Patient taking differently: Take 1 mg by mouth daily as needed for anxiety.), Disp: 90 tablet, Rfl: 1   Budeson-Glycopyrrol-Formoterol (BREZTRI AEROSPHERE) 160-9-4.8 MCG/ACT AERO, Inhale 2 puffs once a day with spacer to help prevent cough and wheeze., Disp: 10.7 g, Rfl: 5   cyclobenzaprine (FLEXERIL) 5 MG tablet, Take 5 mg by mouth 3 (three) times daily as needed for muscle spasms., Disp: , Rfl:    DEXILANT 60 MG  capsule, Take 1 capsule (60 mg total) by mouth daily. (Patient taking differently: Take 60 mg by mouth daily before breakfast.), Disp: 90 capsule, Rfl: 1   EPINEPHrine 0.3 mg/0.3 mL IJ SOAJ injection, INJECT AS DIRECTED FOR SEVERE ALLERGIC REACTION, Disp: 2 each, Rfl: 1   FASENRA 30 MG/ML SOSY, INJECT '30MG'$  SUBCUTANEOUSLY  EVERY 8 WEEKS, Disp: 1 mL, Rfl: 6   gabapentin (NEURONTIN) 300 MG capsule, Take 300 mg by mouth in the morning., Disp: , Rfl:    gabapentin (NEURONTIN) 600 MG tablet, Take by mouth., Disp: , Rfl:    Hyoscyamine Sulfate SL (LEVSIN/SL) 0.125 MG SUBL, Place 0.125 mg under the tongue 3 (three) times daily as needed. Would begin by taking once daily in AM.  If episode of pain/discomfort arises, then use another for up to 3 tablets a day total, Disp: 90 tablet, Rfl: 2   meclizine (ANTIVERT) 12.5 MG tablet, Take 12.5 mg by mouth 3 (three) times daily as needed for dizziness., Disp: , Rfl:    meclizine (ANTIVERT) 25 MG tablet, TAKE 1 TABLET BY MOUTH THREE TIMES DAILY AS NEEDED FOR DIZZINESS, Disp: 40 tablet, Rfl: 0   methocarbamol (ROBAXIN) 500 MG tablet, Take 1 tablet (500 mg total) by mouth 4 (four) times daily., Disp: 60 tablet, Rfl: 2   montelukast (SINGULAIR) 10 MG tablet, Take 1 tablet (10 mg total) by mouth at bedtime. (Patient taking differently: Take 10 mg by mouth in the morning.), Disp: 90 tablet, Rfl: 2   naloxone (NARCAN) nasal spray 4 mg/0.1  mL, One spray in both nares daily, Disp: , Rfl:    nitroGLYCERIN (NITROSTAT) 0.4 MG SL tablet, Place 1 tablet (0.4 mg total) under the tongue every 5 (five) minutes as needed for chest pain., Disp: 25 tablet, Rfl: 1   promethazine (PHENERGAN) 25 MG tablet, Take by mouth., Disp: , Rfl:    traMADol (ULTRAM) 50 MG tablet, Take 1 tablet (50 mg total) by mouth every 6 (six) hours as needed., Disp: 60 tablet, Rfl: 1   valACYclovir (VALTREX) 1000 MG tablet, Take 1 tablet by mouth twice daily (Patient taking differently: Take 1,000 mg by mouth in  the morning.), Disp: 180 tablet, Rfl: 1  Current Facility-Administered Medications:    Benralizumab SOSY 30 mg, 30 mg, Subcutaneous, Q8 weeks, Valentina Shaggy, MD, 30 mg at 03/27/22 0854   Vital signs in last 24 hrs: Vitals:   05/07/22 1040  BP: 120/60  Pulse: 83  SpO2: 96%   Wt Readings from Last 3 Encounters:  05/07/22 110 lb (49.9 kg)  05/03/22 114 lb (51.7 kg)  04/04/22 106 lb (48.1 kg)    Physical Exam  Visibly uncomfortable from back pain while sitting up in the chair.  Antalgic gait. Sclera anicteric, no jaundice No additional exam -entire visit spent in result review and discussion.  Labs:   ___________________________________________ Radiologic studies:  Dr. Donneta Romberg EUS report was reviewed and discussed with him the day that he performed it.  ____________________________________________ Other: Narrative & Impression CLINICAL DATA:  Fall, back pain   EXAM: LUMBAR SPINE - COMPLETE 4+ VIEW   COMPARISON:  12/04/2021   FINDINGS: Superior endplate fractures of N56, L1, L2, and L3 are unchanged without significant loss of height. Normal lumbar lordosis. No acute fracture or listhesis of the lumbar spine. Mild intervertebral disc space narrowing at T11-L2 is present keeping with changes of mild degenerative disc disease, stable. Remaining intervertebral disc heights are preserved. Facet arthrosis of L4-S1 is not well profiled on this exam. Paraspinal soft tissues are unremarkable.   IMPRESSION: Stable superior endplate fractures of O13-Y8. No acute fracture or listhesis of the lumbar spine.     Electronically Signed   By: Fidela Salisbury M.D.   On: 05/06/2022 00:00   _____________________________________________ Assessment & Plan  Assessment: Encounter Diagnoses  Name Primary?   Generalized abdominal pain Yes   Barrett's esophagus without dysplasia    Common bile duct dilation    Gastroesophageal reflux disease without esophagitis     Hiatal hernia    Pancreatic cyst    Incidental dilatation of the common bile duct -no pathologic cause on recent EUS.  Chronic stable abdominal pain.  She took hyoscyamine briefly after the EUS but then no longer felt she needed it.  Reflux symptoms under suboptimal control, and she absolutely needs to make every effort to stop smoking.  Without that, acid suppression will have limited effect.  She has an underlying hiatal hernia and I think she is a poor surgical candidate.  Barrett's esophagus, short segment without dysplasia -recall EGD 5 years.  No additional testing or changes in treatment at this point.  22 minutes were spent on this encounter (including chart review, history/exam, counseling/coordination of care, and documentation) > 50% of that time was spent on counseling and coordination of care.   Nelida Meuse III

## 2022-05-07 NOTE — Patient Instructions (Addendum)
_______________________________________________________  If you are age 66 or older, your body mass index should be between 23-30. Your Body mass index is 20.12 kg/m. If this is out of the aforementioned range listed, please consider follow up with your Primary Care Provider.  If you are age 12 or younger, your body mass index should be between 19-25. Your Body mass index is 20.12 kg/m. If this is out of the aformentioned range listed, please consider follow up with your Primary Care Provider.   ________________________________________________________  The [facility] providers would like to encourage you to use [facility] to communicate with providers for non-urgent requests or questions.  Due to long hold times on the telephone, sending your provider a message by [facility] may be a faster and more efficient way to get a response.  Please allow 48 business hours for a response.  Please remember that this is for non-urgent requests.  _______________________________________________________  Follow up as needed. Call with any questions or concerns.  It was a pleasure to see you today!  Thank you for trusting me with your gastrointestinal care!

## 2022-05-07 NOTE — Telephone Encounter (Signed)
Patient was notified and informed of results in a previous result note.

## 2022-05-08 ENCOUNTER — Telehealth: Payer: Self-pay

## 2022-05-08 DIAGNOSIS — M545 Low back pain, unspecified: Secondary | ICD-10-CM

## 2022-05-08 NOTE — Telephone Encounter (Signed)
Ok I have referred to Sports Medicine for further consideration

## 2022-05-08 NOTE — Telephone Encounter (Signed)
Lori [name] called wanting to know about lab results and Imaging results, made Lori [name] aware of results based on what Dr. Jenny Reichmann notes. Lori [name] stated she is still experiencing some pain in her back.   Please advise what to do next

## 2022-05-08 NOTE — Addendum Note (Signed)
Addended by: Biagio Borg on: 05/08/2022 04:37 PM   Modules accepted: Orders

## 2022-05-09 ENCOUNTER — Ambulatory Visit (INDEPENDENT_AMBULATORY_CARE_PROVIDER_SITE_OTHER): Payer: Medicare Other | Admitting: Physician Assistant

## 2022-05-09 ENCOUNTER — Encounter: Payer: Self-pay | Admitting: Physician Assistant

## 2022-05-09 ENCOUNTER — Ambulatory Visit (INDEPENDENT_AMBULATORY_CARE_PROVIDER_SITE_OTHER): Payer: Medicare Other

## 2022-05-09 DIAGNOSIS — M5441 Lumbago with sciatica, right side: Secondary | ICD-10-CM

## 2022-05-09 DIAGNOSIS — M5442 Lumbago with sciatica, left side: Secondary | ICD-10-CM

## 2022-05-09 NOTE — Progress Notes (Signed)
Office Visit Note   Patient: Lori [name]           Date of Birth: [birth year]           MRN: [id number] Visit Date: 05/09/2022              Requested by: Biagio Borg, MD [address] [place],  [place] [address] PCP: Biagio Borg, MD      HPI: Patient presents today 1 week status post falling onto her lower back in her bathroom.  She was seen and evaluated by her primary care provider.  He did hip and lumbar spine x-rays.  She did have hip and lumbar spine x-rays.  The hips x-rays did not show anything acute and little arthritic changes.  The lumbar spine x-ray demonstrated superior endplate fractures of P95-K9.  These were felt not to be acute she also had some mild interval degenerative changes.  She has been taking tramadol and muscle relaxants.  She does relate a long history of leg spasms that sometimes cause her legs to give way.  She has discussed this with her primary care provider.  She she denies any loss of bowel or bladder control any paresthesias or radicular findings.  Assessment & Plan: Visit Diagnoses:  1. Acute midline low back pain with bilateral sciatica     Plan: On exam does coordinate with area of possible subacute fractures.  I have given her a lumbar support would like to reexamine her in a week.  We will also discuss her with Dr. Durward Fortes.  Follow-Up Instructions: No follow-ups on file.   Ortho Exam  Patient is alert, oriented, no adenopathy, well-dressed, normal affect, normal respiratory effort. Patient is very slow to stand and sit.  She is tender over the lower thoracic spine through the lumbar spine to about L4.  No bruising is appreciated today.  No redness.  She has difficulty moving her back secondary to pain.  She has good strength bilaterally with flexion extension of her knees and flexion extension of her ankles.  Sensation is at her baseline  Imaging: No results found. No images are attached to the encounter.  Labs: Lab Results   Component Value Date   HGBA1C 5.3 01/10/2022   HGBA1C 5.3 05/23/2021   HGBA1C 5.2 11/14/2020   ESRSEDRATE 18 06/30/2017   ESRSEDRATE 17 10/06/2012   ESRSEDRATE 21 12/08/2007   CRP 0.5 06/30/2017   CRP 1.5 10/06/2012   REPTSTATUS 11/01/2016 FINAL 10/26/2016   REPTSTATUS 11/01/2016 FINAL 10/26/2016   CULT  10/26/2016    NO GROWTH 5 DAYS Performed at [facility], [place] [address], [place], [place] [address]    CULT  10/26/2016    NO GROWTH 5 DAYS Performed at [facility] [address], [place], [place] [address]    LABORGA NO GROWTH 06/21/2016     Lab Results  Component Value Date   ALBUMIN 4.3 05/03/2022   ALBUMIN 3.9 03/15/2022   ALBUMIN 4.4 01/10/2022    Lab Results  Component Value Date   MG 2.1 04/02/2021   MG 2.3 10/29/2016   MG 1.5 (L) 10/26/2016   Lab Results  Component Value Date   VD25OH 22.86 (L) 01/10/2022   VD25OH 16.81 (L) 05/23/2021   VD25OH 40.04 11/14/2020    No results found for: "PREALBUMIN"    Latest Ref Rng & Units 05/03/2022   12:06 PM 01/10/2022    2:53 PM 11/22/2021    8:26 AM  CBC EXTENDED  WBC 4.0 - 10.5 K/uL 6.4  7.9  9.3   RBC 3.87 - 5.11 Mil/uL 4.31  4.32  4.28   Hemoglobin 12.0 - 15.0 g/dL 13.5  13.7  13.9   HCT 36.0 - 46.0 % 40.6  40.2  39.9   Platelets 150.0 - 400.0 K/uL 249.0  261.0  296   NEUT# 1.4 - 7.7 K/uL 3.7  5.1    Lymph# 0.7 - 4.0 K/uL 2.2  2.4       There is no height or weight on file to calculate BMI.  Orders:  Orders Placed This Encounter  Procedures   XR Thoracic Spine 2 View   No orders of the defined types were placed in this encounter.    Procedures: No procedures performed  Clinical Data: No additional findings.  ROS:  All other systems negative, except as noted in the HPI. Review of Systems  Objective: Vital Signs: There were no vitals taken for this visit.  Specialty Comments:  No specialty comments available.  PMFS History: Patient Active Problem List   Diagnosis Date  Noted   Weight loss 01/10/2022   Arthritis of carpometacarpal [facility]) joint of right thumb 10/25/2021   Right-sided chest pain 09/05/2021   Neck pain 04/11/2021   Thoracic spine pain 04/11/2021   Low back pain 04/11/2021   Bilateral hip pain 04/11/2021   AKI (acute kidney injury) ([place]) 04/11/2021   Allergic rhinitis 01/08/2021   Abnormal urine odor 11/14/2020   Aortic atherosclerosis ([place]) 11/14/2020   Severe persistent asthma without complication 03/11/9484   Hymenoptera allergy 11/02/2020   Genital herpes 11/12/2019   Coccyx pain 11/12/2019   Insomnia 11/12/2019   Acute right-sided low back pain without sciatica 07/20/2019   Grief 07/20/2019   Vitamin D deficiency 07/20/2019   Hyperglycemia 04/21/2019   Left knee pain 04/21/2019   Pain 12/29/2018   B12 deficiency 10/20/2018   Bilateral hand pain 08/03/2018   Nausea 04/27/2018   Anemia, iron deficiency 04/27/2018   Intermittent paresthesia of hand and foot 04/27/2018   Headache 03/21/2018   Lumbar radiculitis 08/15/2017   Degenerative disc disease, lumbar 07/21/2017   Encounter for well adult exam with abnormal findings 06/30/2017   Epigastric pain 06/30/2017   Restrictive lung disease 06/30/2017   Leg cramping 06/09/2017   Elbow contusion 06/09/2017   Right elbow pain 05/27/2017   Asthma-COPD overlap syndrome ([place]) 01/31/2017   Seasonal and perennial allergic rhinitis 01/01/2017   Moderate persistent asthma, uncomplicated 46/27/0350   Pulmonary emphysema ([place]) 12/03/2016   Tobacco abuse 12/03/2016   Non-seasonal allergic rhinitis due to fungal spores 12/03/2016   Medication management 11/29/2016   Rash 11/23/2016   Vertigo 10/22/2016   Gastroesophageal reflux disease with esophagitis 10/22/2016   Non compliance w medication regimen 08/01/2016   Allergic contact dermatitis due to metals 08/01/2016   Migraine without aura and without status migrainosus, not intractable 07/22/2016   Spasm of muscle of lower back  06/21/2016   Chronic rhinitis 06/19/2016   Compliance poor 06/19/2016   Gait difficulty 06/21/2015   Fibromyalgia 06/21/2015   Conversion reaction 06/21/2015   Depression 06/21/2015   Syncope and collapse 09/38/1829   Plica syndrome of left knee 01/06/2015   Anxiety state 09/06/2014   History of MI 09/06/2014   Cigarette smoker 06/27/2013   Borderline hypertension 06/27/2013   Chronic low back pain with sciatica 06/14/2013   Leg pain, bilateral 06/14/2013   Gastroparesis 10/26/2009   Musculoskeletal chest pain 05/02/2009   Hyperlipidemia LDL  goal <100 11/18/2008   COPD exacerbation ([place]) 11/18/2008   HEPATIC CYST 11/18/2008   Past Medical History:  Diagnosis Date   Allergic rhinitis    Anemia    hgb 13.9 on 11/22/21   Ankle fracture 05/2016   Anxiety    Arthritis    Asthma    Barrett esophagus 2007   Chest pain    Hx, no current problems as of 11/22/21.  Recurrent episodes.  Has had 3 negative nuclear stress test, and a completely normal coronary CT angiogram   COPD (chronic obstructive pulmonary disease) with chronic bronchitis (HCC)    & Emphysema   Depression    Duodenitis without mention of hemorrhage 2007   Esophageal reflux 2007   Esophageal stricture    Full dentures    patient does not wear dentures as of 11/22/21   H/O hiatal hernia    Hiatal hernia 2006,2007   HSV infection    Hyperlipidemia    rx crestor but pt not taking   Multiple fractures    from falls, fx rt. elbow, fx left wrist, bilateral ankles   Pneumonia 10/2016   Restrictive lung disease 06/30/2017   Ulcer    no current problems as of 11/22/21    Family History  Problem Relation Age of Onset   Emphysema Father    Dementia Mother    Diabetes Maternal Grandmother    Diabetes Maternal Aunt    Diabetes Other        mat. cousin   Heart disease Brother    Colon cancer Neg Hx    Allergic rhinitis Neg Hx    Angioedema Neg Hx    Asthma Neg Hx    Atopy Neg Hx    Eczema Neg Hx    Immunodeficiency  Neg Hx    Urticaria Neg Hx     Past Surgical History:  Procedure Laterality Date   ABDOMINAL HYSTERECTOMY     BSO   APPENDECTOMY     BIOPSY  04/04/2022   Procedure: BIOPSY;  Surgeon: Rush Landmark, Telford Nab., MD;  Location: [facility];  Service: Gastroenterology;;   CESAREAN SECTION     x 2   CHOLECYSTECTOMY     CHONDROPLASTY Left 01/06/2015   Procedure: CHONDROPLASTY;  Surgeon: Marybelle Killings, MD;  Location: [facility];  Service: Orthopedics;  Laterality: Left;   COLONOSCOPY     CT CTA CORONARY W/CA SCORE W/CM &/OR WO/CM  12/02/2016   CORONARY CALCIUM SCORE 0.  NO EVIDENCE OF CAD.  Normal-sized PA.  Normal pulmonary venous drainage the left atrium.  No ASD or VSD.   ELBOW FRACTURE SURGERY     right   ESOPHAGOGASTRODUODENOSCOPY (EGD) WITH PROPOFOL N/A 04/04/2022   Procedure: ESOPHAGOGASTRODUODENOSCOPY (EGD) WITH PROPOFOL;  Surgeon: Rush Landmark Telford Nab., MD;  Location: [facility];  Service: Gastroenterology;  Laterality: N/A;   EUS N/A 04/04/2022   Procedure: UPPER ENDOSCOPIC ULTRASOUND (EUS) RADIAL;  Surgeon: Irving Copas., MD;  Location: [facility];  Service: Gastroenterology;  Laterality: N/A;   EYE SURGERY Bilateral    cataracts removed   KNEE ARTHROSCOPY WITH EXCISION PLICA Left 14/78/2956   Procedure: KNEE ARTHROSCOPY WITH EXCISION PLICA;  Surgeon: Marybelle Killings, MD;  Location: [facility];  Service: Orthopedics;  Laterality: Left;   LIGAMENT REPAIR Right 11/28/2021   Procedure: RIGHT THUMB LIGAMENT RECONSTRUCTION AND TENDON INTERPOSITION;  Surgeon: Sherilyn Cooter, MD;  Location: [facility];  Service: Orthopedics;  Laterality: Right;   Lower Extremity Arterial Dopplers  07/06/2013  RABI 1.0, LABI 1.1.; No evidence of significant vascular atherogenic plaque   NECK SURGERY     NM MYOVIEW LTD  09/2014   a) LOW RISK. Normal EF of 65%, no RWMA. NO ISCHEMIA OR INFARCTION.;; b) 05/02/2009: Normal study.  LOW RISK.  No ischemia or infarction.  EF  74%.  Breast attenuation noted.   TONSILLECTOMY     TRANSTHORACIC ECHOCARDIOGRAM  03/28/2021   a) Normal EF 60 to 65%.  GR 1 DD.  No R WMA.  Normal RV size and function.  Normal RAP.  Normal valves. => NORMAL;; b) 04/23/16/2010: Normal study.  LOW RISK.  No ischemia or infarction.  EF 74%.  Breast attenuation noted.17/2010: ([facility]) EF>55%.  Normal wall motion.  Normal valves.   WRIST FRACTURE SURGERY     left, has plate   Social History   Occupational History   Occupation: disable  Tobacco Use   Smoking status: Every Day    Packs/day: 0.50    Years: 39.00    Total pack years: 19.50    Types: Cigarettes    Start date: 08/06/1977   Smokeless tobacco: Never   Tobacco comments:    Patient has cut back to 2-3 per day as of 11/22/21  Vaping Use   Vaping Use: Never used  Substance and Sexual Activity   Alcohol use: No    Alcohol/week: 0.0 standard drinks of alcohol   Drug use: No   Sexual activity: Not Currently    Birth control/protection: Surgical    Comment: Hysterectomy

## 2022-05-14 ENCOUNTER — Ambulatory Visit: Payer: Medicare Other | Admitting: Allergy & Immunology

## 2022-05-14 NOTE — Telephone Encounter (Signed)
Patient seen at ortho care for this particular issue

## 2022-05-15 ENCOUNTER — Ambulatory Visit (INDEPENDENT_AMBULATORY_CARE_PROVIDER_SITE_OTHER): Payer: Medicare Other | Admitting: Orthopaedic Surgery

## 2022-05-15 ENCOUNTER — Ambulatory Visit (INDEPENDENT_AMBULATORY_CARE_PROVIDER_SITE_OTHER): Payer: Medicare Other

## 2022-05-15 ENCOUNTER — Encounter: Payer: Self-pay | Admitting: Orthopaedic Surgery

## 2022-05-15 DIAGNOSIS — M62830 Muscle spasm of back: Secondary | ICD-10-CM

## 2022-05-15 DIAGNOSIS — M25572 Pain in left ankle and joints of left foot: Secondary | ICD-10-CM

## 2022-05-15 DIAGNOSIS — M5440 Lumbago with sciatica, unspecified side: Secondary | ICD-10-CM

## 2022-05-15 NOTE — Progress Notes (Signed)
Office Visit Note   Patient: Lori [name]           Date of Birth: 1955/12/19           MRN: [id number] Visit Date: 05/15/2022              Requested by: Biagio Borg, MD [address] [place],  [place] [address] PCP: Biagio Borg, MD  Chief Complaint  Patient presents with   Lower Back - Pain, Follow-up      HPI: Lori [name] follows up today for her low back pain.  She is approximately 10 days status post falling in her bathroom onto her back.  X-rays at that time demonstrated questionable acute versus subacute fractures at the endplates of D78-E4.  This is the area that she felt was the most painful.  She was given a lumbar support.  She admits she has had a history of back problems in the past and has been on chronic muscle relaxants she denies any radiation of symptoms down her legs.  She does feel a little bit better.  Her primary care provider has given her muscle relaxants which she does take.  She is also complaining of left ankle pain.  She thinks she twisted her foot when she fell.  May have from conversation had this problems with this foot in the past.  She denies any loss of bowel or bladder control  Assessment & Plan: Visit Diagnoses:  1. Pain in left ankle and joints of left foot   2. Acute bilateral low back pain with sciatica, sciatica laterality unspecified   3. Spasm of muscle of lower back     Plan: Chronic low back pain exacerbated by her recent fall.  She does not have any radicular findings and her strength is preserved.  Should continue to use the back support and take her muscle relaxants as needed.  She understands that this may take several weeks to feel better.  We will follow-up in 1 month.  Consider an MRI scan if no improvement Planovalgus collapse of the left ankle with lateral impingement findings.  Her x-rays do not show any acute fractures.  She does not have any swelling redness today.  She is most tender globally throughout her ankle may be  more focally over the sinus tarsi as a point of impingement.  We have given her an ankle support which should help her significantly.  Again this was probably a chronic problem which is accentuated with her recent injury.  Follow-Up Instructions: Return in about 1 month (around 06/15/2022).   Ortho Exam  Patient is alert, oriented, no adenopathy, well-dressed, normal affect, normal respiratory effort. Examination of her low back.  She has still tenderness but no deformity from T12 down through the lower back.  She has 5 out of 5 strength with resisted flexion extension of her ankles extension and flexion of her knees and hips sensation is intact. Examination of her left ankle with weightbearing she has planovalgus collapse.  She has global pain both laterally and medially along the posterior tibial tendon and peroneal tendon but is able to fire these though the peroneal tendons somewhat weakly.  Tender over the sinus tarsi.  Sensation is intact no soft tissue swelling.  She has multiple varicosities across her foot.  Pulses are intact.  No edema.  Considerable pronation with weightbearing  Imaging: XR Ankle Complete Left  Result Date: 05/15/2022 Radiographs of her left ankle in multiple projections were reviewed  today.  She has well-maintained alignment through the mortise.  No evidence of any significant arthritis either in the tibiotalar or the subtalar joint.  No acute fractures are noted.  May be minimal degenerative change along the medial gutter  No images are attached to the encounter.  Labs: Lab Results  Component Value Date   HGBA1C 5.3 01/10/2022   HGBA1C 5.3 05/23/2021   HGBA1C 5.2 11/14/2020   ESRSEDRATE 18 06/30/2017   ESRSEDRATE 17 10/06/2012   ESRSEDRATE 21 12/08/2007   CRP 0.5 06/30/2017   CRP 1.5 10/06/2012   REPTSTATUS 11/01/2016 FINAL 10/26/2016   REPTSTATUS 11/01/2016 FINAL 10/26/2016   CULT  10/26/2016    NO GROWTH 5 DAYS Performed at [facility], [place] [address], [place], [place] [address]    CULT  10/26/2016    NO GROWTH 5 DAYS Performed at [facility] [address] [place], [place] [address]    LABORGA NO GROWTH 06/21/2016     Lab Results  Component Value Date   ALBUMIN 4.3 05/03/2022   ALBUMIN 3.9 03/15/2022   ALBUMIN 4.4 01/10/2022    Lab Results  Component Value Date   MG 2.1 04/02/2021   MG 2.3 10/29/2016   MG 1.5 (L) 10/26/2016   Lab Results  Component Value Date   VD25OH 22.86 (L) 01/10/2022   VD25OH 16.81 (L) 05/23/2021   VD25OH 40.04 11/14/2020    No results found for: "PREALBUMIN"    Latest Ref Rng & Units 05/03/2022   12:06 PM 01/10/2022    2:53 PM 11/22/2021    8:26 AM  CBC EXTENDED  WBC 4.0 - 10.5 K/uL 6.4  7.9  9.3   RBC 3.87 - 5.11 Mil/uL 4.31  4.32  4.28   Hemoglobin 12.0 - 15.0 g/dL 13.5  13.7  13.9   HCT 36.0 - 46.0 % 40.6  40.2  39.9   Platelets 150.0 - 400.0 K/uL 249.0  261.0  296   NEUT# 1.4 - 7.7 K/uL 3.7  5.1    Lymph# 0.7 - 4.0 K/uL 2.2  2.4       There is no height or weight on file to calculate BMI.  Orders:  Orders Placed This Encounter  Procedures   XR Ankle Complete Left   No orders of the defined types were placed in this encounter.    Procedures: No procedures performed  Clinical Data: No additional findings.  ROS:  All other systems negative, except as noted in the HPI. Review of Systems  All other systems reviewed and are negative.   Objective: Vital Signs: There were no vitals taken for this visit.  Specialty Comments:  No specialty comments available.  PMFS History: Patient Active Problem List   Diagnosis Date Noted   Pain in left ankle and joints of left foot 05/15/2022   Weight loss 01/10/2022   Arthritis of carpometacarpal [facility]) joint of right thumb 10/25/2021   Right-sided chest pain 09/05/2021   Neck pain 04/11/2021   Thoracic spine pain 04/11/2021   Low back pain 04/11/2021   Bilateral hip pain 04/11/2021   AKI (acute kidney injury)  ([place]) 04/11/2021   Allergic rhinitis 01/08/2021   Abnormal urine odor 11/14/2020   Aortic atherosclerosis ([place]) 11/14/2020   Severe persistent asthma without complication 58/05/9832   Hymenoptera allergy 11/02/2020   Genital herpes 11/12/2019   Coccyx pain 11/12/2019   Insomnia 11/12/2019   Acute right-sided low back pain without sciatica 07/20/2019   Grief 07/20/2019   Vitamin  D deficiency 07/20/2019   Hyperglycemia 04/21/2019   Left knee pain 04/21/2019   Pain 12/29/2018   B12 deficiency 10/20/2018   Bilateral hand pain 08/03/2018   Nausea 04/27/2018   Anemia, iron deficiency 04/27/2018   Intermittent paresthesia of hand and foot 04/27/2018   Headache 03/21/2018   Lumbar radiculitis 08/15/2017   Degenerative disc disease, lumbar 07/21/2017   Encounter for well adult exam with abnormal findings 06/30/2017   Epigastric pain 06/30/2017   Restrictive lung disease 06/30/2017   Leg cramping 06/09/2017   Elbow contusion 06/09/2017   Right elbow pain 05/27/2017   Asthma-COPD overlap syndrome ([place]) 01/31/2017   Seasonal and perennial allergic rhinitis 01/01/2017   Moderate persistent asthma, uncomplicated 32/20/2542   Pulmonary emphysema ([place]) 12/03/2016   Tobacco abuse 12/03/2016   Non-seasonal allergic rhinitis due to fungal spores 12/03/2016   Medication management 11/29/2016   Rash 11/23/2016   Vertigo 10/22/2016   Gastroesophageal reflux disease with esophagitis 10/22/2016   Non compliance w medication regimen 08/01/2016   Allergic contact dermatitis due to metals 08/01/2016   Migraine without aura and without status migrainosus, not intractable 07/22/2016   Spasm of muscle of lower back 06/21/2016   Chronic rhinitis 06/19/2016   Compliance poor 06/19/2016   Gait difficulty 06/21/2015   Fibromyalgia 06/21/2015   Conversion reaction 06/21/2015   Depression 06/21/2015   Syncope and collapse 70/62/3762   Plica syndrome of left knee 01/06/2015   Anxiety state 09/06/2014    History of MI 09/06/2014   Cigarette smoker 06/27/2013   Borderline hypertension 06/27/2013   Chronic low back pain with sciatica 06/14/2013   Leg pain, bilateral 06/14/2013   Gastroparesis 10/26/2009   Musculoskeletal chest pain 05/02/2009   Hyperlipidemia LDL goal <100 11/18/2008   COPD exacerbation ([place]) 11/18/2008   HEPATIC CYST 11/18/2008   Past Medical History:  Diagnosis Date   Allergic rhinitis    Anemia    hgb 13.9 on 11/22/21   Ankle fracture 05/2016   Anxiety    Arthritis    Asthma    Barrett esophagus 2007   Chest pain    Hx, no current problems as of 11/22/21.  Recurrent episodes.  Has had 3 negative nuclear stress test, and a completely normal coronary CT angiogram   COPD (chronic obstructive pulmonary disease) with chronic bronchitis (HCC)    & Emphysema   Depression    Duodenitis without mention of hemorrhage 2007   Esophageal reflux 2007   Esophageal stricture    Full dentures    patient does not wear dentures as of 11/22/21   H/O hiatal hernia    Hiatal hernia 2006,2007   HSV infection    Hyperlipidemia    rx crestor but pt not taking   Multiple fractures    from falls, fx rt. elbow, fx left wrist, bilateral ankles   Pneumonia 10/2016   Restrictive lung disease 06/30/2017   Ulcer    no current problems as of 11/22/21    Family History  Problem Relation Age of Onset   Emphysema Father    Dementia Mother    Diabetes Maternal Grandmother    Diabetes Maternal Aunt    Diabetes Other        mat. cousin   Heart disease Brother    Colon cancer Neg Hx    Allergic rhinitis Neg Hx    Angioedema Neg Hx    Asthma Neg Hx    Atopy Neg Hx    Eczema Neg Hx    Immunodeficiency  Neg Hx    Urticaria Neg Hx     Past Surgical History:  Procedure Laterality Date   ABDOMINAL HYSTERECTOMY     BSO   APPENDECTOMY     BIOPSY  04/04/2022   Procedure: BIOPSY;  Surgeon: Rush Landmark Telford Nab., MD;  Location: [facility];  Service: Gastroenterology;;   CESAREAN  SECTION     x 2   CHOLECYSTECTOMY     CHONDROPLASTY Left 01/06/2015   Procedure: CHONDROPLASTY;  Surgeon: Marybelle Killings, MD;  Location: [facility];  Service: Orthopedics;  Laterality: Left;   COLONOSCOPY     CT CTA CORONARY W/CA SCORE W/CM &/OR WO/CM  12/02/2016   CORONARY CALCIUM SCORE 0.  NO EVIDENCE OF CAD.  Normal-sized PA.  Normal pulmonary venous drainage the left atrium.  No ASD or VSD.   ELBOW FRACTURE SURGERY     right   ESOPHAGOGASTRODUODENOSCOPY (EGD) WITH PROPOFOL N/A 04/04/2022   Procedure: ESOPHAGOGASTRODUODENOSCOPY (EGD) WITH PROPOFOL;  Surgeon: Rush Landmark Telford Nab., MD;  Location: [facility];  Service: Gastroenterology;  Laterality: N/A;   EUS N/A 04/04/2022   Procedure: UPPER ENDOSCOPIC ULTRASOUND (EUS) RADIAL;  Surgeon: Irving Copas., MD;  Location: [facility];  Service: Gastroenterology;  Laterality: N/A;   EYE SURGERY Bilateral    cataracts removed   KNEE ARTHROSCOPY WITH EXCISION PLICA Left 87/86/7672   Procedure: KNEE ARTHROSCOPY WITH EXCISION PLICA;  Surgeon: Marybelle Killings, MD;  Location: [facility];  Service: Orthopedics;  Laterality: Left;   LIGAMENT REPAIR Right 11/28/2021   Procedure: RIGHT THUMB LIGAMENT RECONSTRUCTION AND TENDON INTERPOSITION;  Surgeon: Sherilyn Cooter, MD;  Location: [facility];  Service: Orthopedics;  Laterality: Right;   Lower Extremity Arterial Dopplers  07/06/2013   RABI 1.0, LABI 1.1.; No evidence of significant vascular atherogenic plaque   NECK SURGERY     NM MYOVIEW LTD  09/2014   a) LOW RISK. Normal EF of 65%, no RWMA. NO ISCHEMIA OR INFARCTION.;; b) 05/02/2009: Normal study.  LOW RISK.  No ischemia or infarction.  EF 74%.  Breast attenuation noted.   TONSILLECTOMY     TRANSTHORACIC ECHOCARDIOGRAM  03/28/2021   a) Normal EF 60 to 65%.  GR 1 DD.  No R WMA.  Normal RV size and function.  Normal RAP.  Normal valves. => NORMAL;; b) 04/23/16/2010: Normal study.  LOW RISK.  No ischemia or infarction.  EF  74%.  Breast attenuation noted.17/2010: ([facility]) EF>55%.  Normal wall motion.  Normal valves.   WRIST FRACTURE SURGERY     left, has plate   Social History   Occupational History   Occupation: disable  Tobacco Use   Smoking status: Every Day    Packs/day: 0.50    Years: 39.00    Total pack years: 19.50    Types: Cigarettes    Start date: 08/06/1977   Smokeless tobacco: Never   Tobacco comments:    Patient has cut back to 2-3 per day as of 11/22/21  Vaping Use   Vaping Use: Never used  Substance and Sexual Activity   Alcohol use: No    Alcohol/week: 0.0 standard drinks of alcohol   Drug use: No   Sexual activity: Not Currently    Birth control/protection: Surgical    Comment: Hysterectomy

## 2022-05-22 ENCOUNTER — Ambulatory Visit: Payer: Medicare Other

## 2022-05-24 ENCOUNTER — Ambulatory Visit (INDEPENDENT_AMBULATORY_CARE_PROVIDER_SITE_OTHER): Payer: Medicare Other | Admitting: *Deleted

## 2022-05-24 DIAGNOSIS — J309 Allergic rhinitis, unspecified: Secondary | ICD-10-CM | POA: Diagnosis not present

## 2022-05-25 ENCOUNTER — Encounter (HOSPITAL_COMMUNITY): Payer: Self-pay

## 2022-05-25 ENCOUNTER — Ambulatory Visit (HOSPITAL_COMMUNITY)
Admission: EM | Admit: 2022-05-25 | Discharge: 2022-05-25 | Disposition: A | Discharge status: Home / Self Care | Payer: Medicare Other | Attending: Physician Assistant | Admitting: Physician Assistant

## 2022-05-25 DIAGNOSIS — R058 Other specified cough: Secondary | ICD-10-CM

## 2022-05-25 DIAGNOSIS — J441 Chronic obstructive pulmonary disease with (acute) exacerbation: Secondary | ICD-10-CM | POA: Diagnosis not present

## 2022-05-25 DIAGNOSIS — R110 Nausea: Secondary | ICD-10-CM

## 2022-05-25 MED ORDER — ONDANSETRON HCL 4 MG PO TABS: 4.0000 mg | ORAL_TABLET | Freq: Four times a day (QID) | ORAL | 0 refills | Status: DC | PRN

## 2022-05-25 MED ORDER — METHYLPREDNISOLONE SODIUM SUCC 125 MG IJ SOLR
80.0000 mg | Freq: Once | INTRAMUSCULAR | Status: AC
  Administered 2022-05-25: 80 mg via INTRAMUSCULAR

## 2022-05-25 MED ORDER — ALBUTEROL SULFATE HFA 108 (90 BASE) MCG/ACT IN AERS: 2.0000 | INHALATION_SPRAY | Freq: Four times a day (QID) | RESPIRATORY_TRACT | 2 refills | Status: DC | PRN

## 2022-05-25 MED ORDER — METHYLPREDNISOLONE SODIUM SUCC 125 MG IJ SOLR
INTRAMUSCULAR | Status: AC
  Filled 2022-05-25: qty 2

## 2022-05-25 MED ORDER — FLUTICASONE PROPIONATE 50 MCG/ACT NA SUSP: 2.0000 | Freq: Every day | NASAL | 3 refills | Status: DC

## 2022-05-25 MED ORDER — CEFUROXIME AXETIL 500 MG PO TABS: 500.0000 mg | ORAL_TABLET | Freq: Two times a day (BID) | ORAL | 0 refills | Status: AC

## 2022-05-25 NOTE — ED Triage Notes (Signed)
Pt states she is coughing up thick white phelm for over a month.

## 2022-05-25 NOTE — ED Provider Notes (Signed)
[place]   MRN: [id number] DOB: [birth year]  Subjective:   Lori [name] is a 66 y.o. female with a history of cigarette smoking, COPD, pulmonary emphysema, asthma, GERD, presenting to the urgent care today with a 3 to 4-week history of productive cough.  Patient states that the cough is worse at night.  Cough is productive of white thick mucus.  She has been feeling nauseated from the symptoms.  She denies any fever or chills.  No chest pain or shortness of breath.  No swelling of lower legs.  States that she would like something to help alleviate the cough and the nausea.   Current Facility-Administered Medications:    Benralizumab SOSY 30 mg, 30 mg, Subcutaneous, Q8 weeks, Ernst Bowler, Gwenith Daily, MD, 30 mg at 03/27/22 4982  Current Outpatient Medications:    albuterol (PROAIR HFA) 108 (90 Base) MCG/ACT inhaler, Inhale 2 puffs into the lungs every 6 (six) hours as needed for wheezing or shortness of breath., Disp: 1 each, Rfl: 2   cefUROXime (CEFTIN) 500 MG tablet, Take 1 tablet (500 mg total) by mouth 2 (two) times daily with a meal for 7 days., Disp: 14 tablet, Rfl: 0   fluticasone (FLONASE) 50 MCG/ACT nasal spray, Place 2 sprays into both nostrils daily., Disp: 16 g, Rfl: 3   ondansetron (ZOFRAN) 4 MG tablet, Take 1 tablet (4 mg total) by mouth every 6 (six) hours as needed for nausea or vomiting., Disp: 12 tablet, Rfl: 0   AIMOVIG 140 MG/ML SOAJ, Inject 140 mg into the skin every 30 (thirty) days., Disp: , Rfl:    ALPRAZolam (XANAX) 1 MG tablet, TAKE 1 TABLET BY MOUTH AT BEDTIME AS NEEDED FOR ANXIETY (Patient taking differently: Take 1 mg by mouth daily as needed for anxiety.), Disp: 90 tablet, Rfl: 1   Budeson-Glycopyrrol-Formoterol (BREZTRI AEROSPHERE) 160-9-4.8 MCG/ACT AERO, Inhale 2 puffs once a day with spacer to help prevent cough and wheeze., Disp: 10.7 g, Rfl: 5   cyclobenzaprine (FLEXERIL) 5 MG tablet, Take 5 mg by mouth 3 (three) times daily as needed for  muscle spasms., Disp: , Rfl:    DEXILANT 60 MG capsule, Take 1 capsule (60 mg total) by mouth daily. (Patient taking differently: Take 60 mg by mouth daily before breakfast.), Disp: 90 capsule, Rfl: 1   EPINEPHrine 0.3 mg/0.3 mL IJ SOAJ injection, INJECT AS DIRECTED FOR SEVERE ALLERGIC REACTION, Disp: 2 each, Rfl: 1   FASENRA 30 MG/ML SOSY, INJECT '30MG'$  SUBCUTANEOUSLY  EVERY 8 WEEKS, Disp: 1 mL, Rfl: 6   gabapentin (NEURONTIN) 300 MG capsule, Take 300 mg by mouth in the morning., Disp: , Rfl:    gabapentin (NEURONTIN) 600 MG tablet, Take by mouth., Disp: , Rfl:    Hyoscyamine Sulfate SL (LEVSIN/SL) 0.125 MG SUBL, Place 0.125 mg under the tongue 3 (three) times daily as needed. Would begin by taking once daily in AM.  If episode of pain/discomfort arises, then use another for up to 3 tablets a day total, Disp: 90 tablet, Rfl: 2   meclizine (ANTIVERT) 12.5 MG tablet, Take 12.5 mg by mouth 3 (three) times daily as needed for dizziness., Disp: , Rfl:    meclizine (ANTIVERT) 25 MG tablet, TAKE 1 TABLET BY MOUTH THREE TIMES DAILY AS NEEDED FOR DIZZINESS, Disp: 40 tablet, Rfl: 0   methocarbamol (ROBAXIN) 500 MG tablet, Take 1 tablet (500 mg total) by mouth 4 (four) times daily., Disp: 60 tablet, Rfl: 2   montelukast (SINGULAIR) 10 MG tablet, Take  1 tablet (10 mg total) by mouth at bedtime. (Patient taking differently: Take 10 mg by mouth in the morning.), Disp: 90 tablet, Rfl: 2   naloxone (NARCAN) nasal spray 4 mg/0.1 mL, One spray in both nares daily, Disp: , Rfl:    nitroGLYCERIN (NITROSTAT) 0.4 MG SL tablet, Place 1 tablet (0.4 mg total) under the tongue every 5 (five) minutes as needed for chest pain., Disp: 25 tablet, Rfl: 1   promethazine (PHENERGAN) 25 MG tablet, Take by mouth., Disp: , Rfl:    traMADol (ULTRAM) 50 MG tablet, Take 1 tablet (50 mg total) by mouth every 6 (six) hours as needed., Disp: 60 tablet, Rfl: 1   valACYclovir (VALTREX) 1000 MG tablet, Take 1 tablet by mouth twice daily (Patient  taking differently: Take 1,000 mg by mouth in the morning.), Disp: 180 tablet, Rfl: 1   Allergies  Allergen Reactions   Advil [Ibuprofen] Nausea And Vomiting   Bee Venom Swelling   Codeine Nausea Only and Other (See Comments)    CAUSES ULCERS   Clonidine Derivatives Other (See Comments)    Unknown    Statins Other (See Comments)    unknown   Latex Rash   Levaquin [Levofloxacin] Nausea Only   Propoxyphene N-Acetaminophen Nausea Only    Past Medical History:  Diagnosis Date   Allergic rhinitis    Anemia    hgb 13.9 on 11/22/21   Ankle fracture 05/2016   Anxiety    Arthritis    Asthma    Barrett esophagus 2007   Chest pain    Hx, no current problems as of 11/22/21.  Recurrent episodes.  Has had 3 negative nuclear stress test, and a completely normal coronary CT angiogram   COPD (chronic obstructive pulmonary disease) with chronic bronchitis (HCC)    & Emphysema   Depression    Duodenitis without mention of hemorrhage 2007   Esophageal reflux 2007   Esophageal stricture    Full dentures    patient does not wear dentures as of 11/22/21   H/O hiatal hernia    Hiatal hernia 2006,2007   HSV infection    Hyperlipidemia    rx crestor but pt not taking   Multiple fractures    from falls, fx rt. elbow, fx left wrist, bilateral ankles   Pneumonia 10/2016   Restrictive lung disease 06/30/2017   Ulcer    no current problems as of 11/22/21     Past Surgical History:  Procedure Laterality Date   ABDOMINAL HYSTERECTOMY     BSO   APPENDECTOMY     BIOPSY  04/04/2022   Procedure: BIOPSY;  Surgeon: Irving Copas., MD;  Location: [place];  Service: Gastroenterology;;   CESAREAN SECTION     x 2   CHOLECYSTECTOMY     CHONDROPLASTY Left 01/06/2015   Procedure: CHONDROPLASTY;  Surgeon: Marybelle Killings, MD;  Location: [place];  Service: Orthopedics;  Laterality: Left;   COLONOSCOPY     CT CTA CORONARY W/CA SCORE W/CM &/OR WO/CM  12/02/2016   CORONARY CALCIUM  SCORE 0.  NO EVIDENCE OF CAD.  Normal-sized PA.  Normal pulmonary venous drainage the left atrium.  No ASD or VSD.   ELBOW FRACTURE SURGERY     right   ESOPHAGOGASTRODUODENOSCOPY (EGD) WITH PROPOFOL N/A 04/04/2022   Procedure: ESOPHAGOGASTRODUODENOSCOPY (EGD) WITH PROPOFOL;  Surgeon: Rush Landmark Telford Nab., MD;  Location: [place];  Service: Gastroenterology;  Laterality: N/A;   EUS N/A 04/04/2022   Procedure: UPPER ENDOSCOPIC ULTRASOUND (  EUS) RADIAL;  Surgeon: Rush Landmark Telford Nab., MD;  Location: [place];  Service: Gastroenterology;  Laterality: N/A;   EYE SURGERY Bilateral    cataracts removed   KNEE ARTHROSCOPY WITH EXCISION PLICA Left 42/70/6237   Procedure: KNEE ARTHROSCOPY WITH EXCISION PLICA;  Surgeon: Marybelle Killings, MD;  Location: [place];  Service: Orthopedics;  Laterality: Left;   LIGAMENT REPAIR Right 11/28/2021   Procedure: RIGHT THUMB LIGAMENT RECONSTRUCTION AND TENDON INTERPOSITION;  Surgeon: Sherilyn Cooter, MD;  Location: [place];  Service: Orthopedics;  Laterality: Right;   Lower Extremity Arterial Dopplers  07/06/2013   RABI 1.0, LABI 1.1.; No evidence of significant vascular atherogenic plaque   NECK SURGERY     NM MYOVIEW LTD  09/2014   a) LOW RISK. Normal EF of 65%, no RWMA. NO ISCHEMIA OR INFARCTION.;; b) 05/02/2009: Normal study.  LOW RISK.  No ischemia or infarction.  EF 74%.  Breast attenuation noted.   TONSILLECTOMY     TRANSTHORACIC ECHOCARDIOGRAM  03/28/2021   a) Normal EF 60 to 65%.  GR 1 DD.  No R WMA.  Normal RV size and function.  Normal RAP.  Normal valves. => NORMAL;; b) 04/23/16/2010: Normal study.  LOW RISK.  No ischemia or infarction.  EF 74%.  Breast attenuation noted.17/2010: ([facility]) EF>55%.  Normal wall motion.  Normal valves.   WRIST FRACTURE SURGERY     left, has plate    Family History  Problem Relation Age of Onset   Emphysema Father    Dementia Mother    Diabetes Maternal Grandmother    Diabetes Maternal Aunt     Diabetes Other        mat. cousin   Heart disease Brother    Colon cancer Neg Hx    Allergic rhinitis Neg Hx    Angioedema Neg Hx    Asthma Neg Hx    Atopy Neg Hx    Eczema Neg Hx    Immunodeficiency Neg Hx    Urticaria Neg Hx     Social History   Tobacco Use   Smoking status: Every Day    Packs/day: 0.50    Years: 39.00    Total pack years: 19.50    Types: Cigarettes    Start date: 08/06/1977   Smokeless tobacco: Never   Tobacco comments:    Patient has cut back to 2-3 per day as of 11/22/21  Vaping Use   Vaping Use: Never used  Substance Use Topics   Alcohol use: No    Alcohol/week: 0.0 standard drinks of alcohol   Drug use: No    ROS REFER TO HPI FOR PERTINENT POSITIVES AND NEGATIVES   Objective:   Vitals: BP 131/68 (BP Location: Right Arm)   Pulse 87   Temp 97.8 F (36.6 C) (Oral)   Resp 16   SpO2 100%   Physical Exam Vitals and nursing note reviewed.  Constitutional:      Appearance: Normal appearance. She is normal weight. She is not toxic-appearing.  HENT:     Head: Normocephalic and atraumatic.     Right Ear: Tympanic membrane, ear canal and external ear normal.     Left Ear: Tympanic membrane, ear canal and external ear normal.     Nose: Nose normal.     Mouth/Throat:     Mouth: Mucous membranes are moist.  Eyes:     Extraocular Movements: Extraocular movements intact.     Conjunctiva/sclera: Conjunctivae normal.     Pupils: Pupils are equal,  round, and reactive to light.  Cardiovascular:     Rate and Rhythm: Normal rate and regular rhythm.     Pulses: Normal pulses.     Heart sounds: Normal heart sounds. No murmur heard. Pulmonary:     Effort: Pulmonary effort is normal. No respiratory distress.     Breath sounds: Examination of the right-lower field reveals decreased breath sounds. Examination of the left-lower field reveals decreased breath sounds. Decreased breath sounds and wheezing (Diffuse mild expiratory wheezes) present.   Musculoskeletal:        General: Normal range of motion.     Cervical back: Normal range of motion and neck supple.     Right lower leg: No edema.     Left lower leg: No edema.  Skin:    General: Skin is warm and dry.     Findings: No rash.  Neurological:     General: No focal deficit present.     Mental Status: She is alert and oriented to person, place, and time.  Psychiatric:        Mood and Affect: Mood normal.        Behavior: Behavior normal.     No results found. However, due to the size of the patient record, not all encounters were searched. Please check Results Review for a complete set of results.  Assessment and Plan :   PDMP not reviewed this encounter.  1. Acute exacerbation of chronic obstructive pulmonary disease (COPD) ([place])   2. Productive cough   3. Nausea without vomiting    Treated for acute COPD exacerbation.  She has had numerous chest x-rays in the last year, I did not feel like repeat x-ray was necessary today.  Treat with cefuroxime, nasal saline, Flonase, Mucinex, albuterol inhaler as directed.  Zofran to help with nausea.  She needs to follow-up with her primary care and also a pulmonologist.  Information provided for pulmonology group.  ED precautions discussed.  Patient agreeable and understanding of plan.   AllwardtRanda Evens, PA-C 05/25/22 1648

## 2022-05-25 NOTE — Discharge Instructions (Addendum)
Good to meet you today.  I think that you are having an acute flareup of COPD.  Please take the cefuroxime antibiotic as directed.  Please have yogurt in your diet once daily.  Use nasal saline and Flonase to help with postnasal drip.  Take your daily allergy medication.  Use rescue inhaler every 6 hours as needed for wheezing, shortness of breath, cough.  Zofran to take every 6-8 hours as needed for nausea.  Follow-up with your primary care.  I have also provided information to follow-up with pulmonology.

## 2022-05-28 ENCOUNTER — Telehealth: Payer: Self-pay

## 2022-05-28 NOTE — Telephone Encounter (Signed)
None needed for now

## 2022-05-28 NOTE — Telephone Encounter (Signed)
Pt is inquiring about getting another Pneumonia vaccine when she comes tomorrow to get her Flu shot.  Pt has gotten  Pneumococcal Conjugate-13 12/31/16, 12/30/14 Pneumococcal Polysaccharide-23 05/14/21, 12/31/17, 01/17/14  Please indicate which is needed if any.  Please advise

## 2022-05-29 ENCOUNTER — Ambulatory Visit (INDEPENDENT_AMBULATORY_CARE_PROVIDER_SITE_OTHER): Payer: Medicare Other

## 2022-05-29 ENCOUNTER — Ambulatory Visit: Payer: Medicare Other

## 2022-05-29 ENCOUNTER — Ambulatory Visit (INDEPENDENT_AMBULATORY_CARE_PROVIDER_SITE_OTHER): Payer: Medicare Other | Admitting: *Deleted

## 2022-05-29 DIAGNOSIS — J4550 Severe persistent asthma, uncomplicated: Secondary | ICD-10-CM

## 2022-05-29 DIAGNOSIS — Z23 Encounter for immunization: Secondary | ICD-10-CM

## 2022-05-29 NOTE — Progress Notes (Signed)
After obtaining consent, and per orders of Dr. Jenny Reichmann, injection of Flu given left deltoid by Marrian Salvage. Patient tolerated well and instructed to report any adverse reaction to me immediately.

## 2022-06-03 ENCOUNTER — Ambulatory Visit: Payer: Medicare Other

## 2022-06-03 ENCOUNTER — Telehealth: Payer: Self-pay

## 2022-06-03 DIAGNOSIS — G43719 Chronic migraine without aura, intractable, without status migrainosus: Secondary | ICD-10-CM | POA: Diagnosis not present

## 2022-06-03 NOTE — Telephone Encounter (Signed)
Called  patient x 3 with no answer no vm. Patient may reschedule for the next available appointment. L.Wilson,LPN

## 2022-06-05 ENCOUNTER — Telehealth: Payer: Self-pay | Admitting: Internal Medicine

## 2022-06-05 NOTE — Telephone Encounter (Signed)
LVM for pt to rtn my call to schedule AWV with [facility] call back # [phone] 

## 2022-06-07 ENCOUNTER — Telehealth: Payer: Self-pay | Admitting: Internal Medicine

## 2022-06-07 ENCOUNTER — Ambulatory Visit (INDEPENDENT_AMBULATORY_CARE_PROVIDER_SITE_OTHER): Payer: Medicare Other | Admitting: Internal Medicine

## 2022-06-07 ENCOUNTER — Encounter: Payer: Self-pay | Admitting: Internal Medicine

## 2022-06-07 VITALS — BP 118/60 | HR 68 | Ht 64.0 in | Wt 111.0 lb

## 2022-06-07 DIAGNOSIS — E538 Deficiency of other specified B group vitamins: Secondary | ICD-10-CM | POA: Diagnosis not present

## 2022-06-07 DIAGNOSIS — F17210 Nicotine dependence, cigarettes, uncomplicated: Secondary | ICD-10-CM

## 2022-06-07 DIAGNOSIS — R739 Hyperglycemia, unspecified: Secondary | ICD-10-CM | POA: Diagnosis not present

## 2022-06-07 DIAGNOSIS — R062 Wheezing: Secondary | ICD-10-CM | POA: Diagnosis not present

## 2022-06-07 DIAGNOSIS — U071 COVID-19: Secondary | ICD-10-CM | POA: Diagnosis not present

## 2022-06-07 DIAGNOSIS — E785 Hyperlipidemia, unspecified: Secondary | ICD-10-CM

## 2022-06-07 DIAGNOSIS — J441 Chronic obstructive pulmonary disease with (acute) exacerbation: Secondary | ICD-10-CM

## 2022-06-07 DIAGNOSIS — E559 Vitamin D deficiency, unspecified: Secondary | ICD-10-CM | POA: Diagnosis not present

## 2022-06-07 LAB — POC SOFIA SARS ANTIGEN FIA: SARS Coronavirus 2 Ag: POSITIVE — AB

## 2022-06-07 MED ORDER — PREDNISONE 10 MG PO TABS: ORAL_TABLET | ORAL | 0 refills | Status: DC

## 2022-06-07 MED ORDER — HYDROCODONE BIT-HOMATROP MBR 5-1.5 MG/5ML PO SOLN: 5.0000 mL | Freq: Four times a day (QID) | ORAL | 0 refills | Status: DC | PRN

## 2022-06-07 MED ORDER — NIRMATRELVIR/RITONAVIR (PAXLOVID)TABLET: 3.0000 | ORAL_TABLET | Freq: Two times a day (BID) | ORAL | 0 refills | Status: AC

## 2022-06-07 MED ORDER — METHYLPREDNISOLONE ACETATE 80 MG/ML IJ SUSP
80.0000 mg | Freq: Once | INTRAMUSCULAR | Status: AC
  Administered 2022-06-07: 80 mg via INTRAMUSCULAR

## 2022-06-07 MED ORDER — CEFDINIR 300 MG PO CAPS: 300.0000 mg | ORAL_CAPSULE | Freq: Two times a day (BID) | ORAL | 0 refills | Status: DC

## 2022-06-07 MED ORDER — DEXLANSOPRAZOLE 60 MG PO CPDR: 1.0000 | DELAYED_RELEASE_CAPSULE | Freq: Every day | ORAL | 3 refills | Status: DC

## 2022-06-07 NOTE — Progress Notes (Unsigned)
Patient ID: Lori [name], female   DOB: 1956/04/15, 66 y.o.   MRN: [id number]        Chief Complaint: follow up COPD exacerbation, recent covid exposure       HPI:  Lori [name] is a 66 y.o. female here with c/o        Wt Readings from Last 3 Encounters:  06/07/22 111 lb (50.3 kg)  05/07/22 110 lb (49.9 kg)  05/03/22 114 lb (51.7 kg)   BP Readings from Last 3 Encounters:  06/07/22 118/60  05/25/22 131/68  05/07/22 120/60         Past Medical History:  Diagnosis Date   Allergic rhinitis    Anemia    hgb 13.9 on 11/22/21   Ankle fracture 05/2016   Anxiety    Arthritis    Asthma    Barrett esophagus 2007   Chest pain    Hx, no current problems as of 11/22/21.  Recurrent episodes.  Has had 3 negative nuclear stress test, and a completely normal coronary CT angiogram   COPD (chronic obstructive pulmonary disease) with chronic bronchitis (HCC)    & Emphysema   Depression    Duodenitis without mention of hemorrhage 2007   Esophageal reflux 2007   Esophageal stricture    Full dentures    patient does not wear dentures as of 11/22/21   H/O hiatal hernia    Hiatal hernia 2006,2007   HSV infection    Hyperlipidemia    rx crestor but pt not taking   Multiple fractures    from falls, fx rt. elbow, fx left wrist, bilateral ankles   Pneumonia 10/2016   Restrictive lung disease 06/30/2017   Ulcer    no current problems as of 11/22/21   Past Surgical History:  Procedure Laterality Date   ABDOMINAL HYSTERECTOMY     BSO   APPENDECTOMY     BIOPSY  04/04/2022   Procedure: BIOPSY;  Surgeon: Irving Copas., MD;  Location: [place];  Service: Gastroenterology;;   CESAREAN SECTION     x 2   CHOLECYSTECTOMY     CHONDROPLASTY Left 01/06/2015   Procedure: CHONDROPLASTY;  Surgeon: Marybelle Killings, MD;  Location: [place];  Service: Orthopedics;  Laterality: Left;   COLONOSCOPY     CT CTA CORONARY W/CA SCORE W/CM &/OR WO/CM  12/02/2016   CORONARY CALCIUM  SCORE 0.  NO EVIDENCE OF CAD.  Normal-sized PA.  Normal pulmonary venous drainage the left atrium.  No ASD or VSD.   ELBOW FRACTURE SURGERY     right   ESOPHAGOGASTRODUODENOSCOPY (EGD) WITH PROPOFOL N/A 04/04/2022   Procedure: ESOPHAGOGASTRODUODENOSCOPY (EGD) WITH PROPOFOL;  Surgeon: Rush Landmark Telford Nab., MD;  Location: [place];  Service: Gastroenterology;  Laterality: N/A;   EUS N/A 04/04/2022   Procedure: UPPER ENDOSCOPIC ULTRASOUND (EUS) RADIAL;  Surgeon: Irving Copas., MD;  Location: [place];  Service: Gastroenterology;  Laterality: N/A;   EYE SURGERY Bilateral    cataracts removed   KNEE ARTHROSCOPY WITH EXCISION PLICA Left 40/98/1191   Procedure: KNEE ARTHROSCOPY WITH EXCISION PLICA;  Surgeon: Marybelle Killings, MD;  Location: [place];  Service: Orthopedics;  Laterality: Left;   LIGAMENT REPAIR Right 11/28/2021   Procedure: RIGHT THUMB LIGAMENT RECONSTRUCTION AND TENDON INTERPOSITION;  Surgeon: Sherilyn Cooter, MD;  Location: [place];  Service: Orthopedics;  Laterality: Right;   Lower Extremity Arterial Dopplers  07/06/2013   RABI 1.0, LABI 1.1.; No evidence of significant vascular atherogenic  plaque   NECK SURGERY     NM MYOVIEW LTD  09/2014   a) LOW RISK. Normal EF of 65%, no RWMA. NO ISCHEMIA OR INFARCTION.;; b) 05/02/2009: Normal study.  LOW RISK.  No ischemia or infarction.  EF 74%.  Breast attenuation noted.   TONSILLECTOMY     TRANSTHORACIC ECHOCARDIOGRAM  03/28/2021   a) Normal EF 60 to 65%.  GR 1 DD.  No R WMA.  Normal RV size and function.  Normal RAP.  Normal valves. => NORMAL;; b) 04/23/16/2010: Normal study.  LOW RISK.  No ischemia or infarction.  EF 74%.  Breast attenuation noted.17/2010: ([facility]) EF>55%.  Normal wall motion.  Normal valves.   WRIST FRACTURE SURGERY     left, has plate    reports that she has been smoking cigarettes. She started smoking about 44 years ago. She has a 19.50 pack-year smoking history. She has never used smokeless  tobacco. She reports that she does not drink alcohol and does not use drugs. family history includes Dementia in her mother; Diabetes in her maternal aunt, maternal grandmother, and another family member; Emphysema in her father; Heart disease in her brother. Allergies  Allergen Reactions   Advil [Ibuprofen] Nausea And Vomiting   Bee Venom Swelling   Codeine Nausea Only and Other (See Comments)    CAUSES ULCERS   Clonidine Derivatives Other (See Comments)    Unknown    Statins Other (See Comments)    unknown   Latex Rash   Levaquin [Levofloxacin] Nausea Only   Propoxyphene N-Acetaminophen Nausea Only   Current Outpatient Medications on File Prior to Visit  Medication Sig Dispense Refill   AIMOVIG 140 MG/ML SOAJ Inject 140 mg into the skin every 30 (thirty) days.     albuterol (PROAIR HFA) 108 (90 Base) MCG/ACT inhaler Inhale 2 puffs into the lungs every 6 (six) hours as needed for wheezing or shortness of breath. 1 each 2   ALPRAZolam (XANAX) 1 MG tablet TAKE 1 TABLET BY MOUTH AT BEDTIME AS NEEDED FOR ANXIETY (Patient taking differently: Take 1 mg by mouth daily as needed for anxiety.) 90 tablet 1   Budeson-Glycopyrrol-Formoterol (BREZTRI AEROSPHERE) 160-9-4.8 MCG/ACT AERO Inhale 2 puffs once a day with spacer to help prevent cough and wheeze. 10.7 g 5   cyclobenzaprine (FLEXERIL) 5 MG tablet Take 5 mg by mouth 3 (three) times daily as needed for muscle spasms.     DEXILANT 60 MG capsule Take 1 capsule (60 mg total) by mouth daily. (Patient taking differently: Take 60 mg by mouth daily before breakfast.) 90 capsule 1   EPINEPHrine 0.3 mg/0.3 mL IJ SOAJ injection INJECT AS DIRECTED FOR SEVERE ALLERGIC REACTION 2 each 1   FASENRA 30 MG/ML SOSY INJECT '30MG'$  SUBCUTANEOUSLY  EVERY 8 WEEKS 1 mL 6   fluticasone (FLONASE) 50 MCG/ACT nasal spray Place 2 sprays into both nostrils daily. 16 g 3   gabapentin (NEURONTIN) 300 MG capsule Take 300 mg by mouth in the morning.     gabapentin (NEURONTIN) 600  MG tablet Take by mouth.     Hyoscyamine Sulfate SL (LEVSIN/SL) 0.125 MG SUBL Place 0.125 mg under the tongue 3 (three) times daily as needed. Would begin by taking once daily in AM.  If episode of pain/discomfort arises, then use another for up to 3 tablets a day total 90 tablet 2   meclizine (ANTIVERT) 12.5 MG tablet Take 12.5 mg by mouth 3 (three) times daily as needed for dizziness.     meclizine (  ANTIVERT) 25 MG tablet TAKE 1 TABLET BY MOUTH THREE TIMES DAILY AS NEEDED FOR DIZZINESS 40 tablet 0   methocarbamol (ROBAXIN) 500 MG tablet Take 1 tablet (500 mg total) by mouth 4 (four) times daily. 60 tablet 2   montelukast (SINGULAIR) 10 MG tablet Take 1 tablet (10 mg total) by mouth at bedtime. (Patient taking differently: Take 10 mg by mouth in the morning.) 90 tablet 2   naloxone (NARCAN) nasal spray 4 mg/0.1 mL One spray in both nares daily     nitroGLYCERIN (NITROSTAT) 0.4 MG SL tablet Place 1 tablet (0.4 mg total) under the tongue every 5 (five) minutes as needed for chest pain. 25 tablet 1   ondansetron (ZOFRAN) 4 MG tablet Take 1 tablet (4 mg total) by mouth every 6 (six) hours as needed for nausea or vomiting. 12 tablet 0   promethazine (PHENERGAN) 25 MG tablet Take by mouth.     traMADol (ULTRAM) 50 MG tablet Take 1 tablet (50 mg total) by mouth every 6 (six) hours as needed. 60 tablet 1   valACYclovir (VALTREX) 1000 MG tablet Take 1 tablet by mouth twice daily (Patient taking differently: Take 1,000 mg by mouth in the morning.) 180 tablet 1   Current Facility-Administered Medications on File Prior to Visit  Medication Dose Route Frequency Provider Last Rate Last Admin   Benralizumab SOSY 30 mg  30 mg Subcutaneous Q8 weeks Valentina Shaggy, MD   30 mg at 05/29/22 1008        ROS:  All others reviewed and negative.  Objective        PE:  BP 118/60   Pulse 68   Ht '5\' 4"'$  (1.626 m)   Wt 111 lb (50.3 kg)   SpO2 98%   BMI 19.05 kg/m                 Constitutional: Pt appears in  NAD               HENT: Head: NCAT.                Right Ear: External ear normal.                 Left Ear: External ear normal.                Eyes: . Pupils are equal, round, and reactive to light. Conjunctivae and EOM are normal               Nose: without d/c or deformity               Neck: Neck supple. Gross normal ROM               Cardiovascular: Normal rate and regular rhythm.                 Pulmonary/Chest: Effort normal and breath sounds without rales or wheezing.                Abd:  Soft, NT, ND, + BS, no organomegaly               Neurological: Pt is alert. At baseline orientation, motor grossly intact               Skin: Skin is warm. No rashes, no other new lesions, LE edema - ***               Psychiatric: Pt behavior is normal without agitation  Micro: none  Cardiac tracings I have personally interpreted today:  none  Pertinent Radiological findings (summarize): none   Lab Results  Component Value Date   WBC 6.4 05/03/2022   HGB 13.5 05/03/2022   HCT 40.6 05/03/2022   PLT 249.0 05/03/2022   GLUCOSE 85 05/03/2022   CHOL 246 (H) 01/10/2022   TRIG 328.0 (H) 01/10/2022   HDL 41.40 01/10/2022   LDLDIRECT 165.0 01/10/2022   LDLCALC Comment 03/12/2017   ALT 11 05/03/2022   AST 15 05/03/2022   NA 139 05/03/2022   K 4.5 05/03/2022   CL 105 05/03/2022   CREATININE 0.87 05/03/2022   BUN 11 05/03/2022   CO2 28 05/03/2022   TSH 1.42 01/10/2022   INR 0.9 08/31/2008   HGBA1C 5.3 01/10/2022   SARS Coronavirus 2 Ag Negative Positive Abnormal    Assessment/Plan:  Lori [name] is a 66 y.o. White or Caucasian [1] female with  has a past medical history of Allergic rhinitis, Anemia, Ankle fracture (05/2016), Anxiety, Arthritis, Asthma, Barrett esophagus (2007), Chest pain, COPD (chronic obstructive pulmonary disease) with chronic bronchitis ([place]), Depression, Duodenitis without mention of hemorrhage (2007), Esophageal reflux (2007), Esophageal stricture, Full  dentures, H/O hiatal hernia, Hiatal hernia (9735,3299), HSV infection, Hyperlipidemia, Multiple fractures, Pneumonia (10/2016), Restrictive lung disease (06/30/2017), and Ulcer.  No problem-specific Assessment & Plan notes found for this encounter.  Followup: No follow-ups on file.  Cathlean Cower, MD 06/07/2022 10:46 AM [facility]

## 2022-06-07 NOTE — Patient Instructions (Addendum)
Your COVID testing was done today and is Positive  Please take all new medication as prescribed  - the Paxlovid antibiotic for the COVID  Please stop smoking  You had the steroid shot today  Please take all new medication as prescribed - the antibiotic, prednisone, cough medicine  You can also take OTC Mucinex (or it's generic off brand) for congestion, and tylenol as needed for pain, as well as Immodium as needed for the diarrhea.  Please continue all other medications as before, and refills have been done if requested - inhaler, and the dexilant  Please have the pharmacy call with any other refills you may need.  Please keep your appointments with your specialists as you may have planned  Please go to the XRAY Department in the first floor for the x-ray testing  Please go to the LAB at the blood drawing area for the tests to be done  You will be contacted by phone if any changes need to be made immediately.  Otherwise, you will receive a letter about your results with an explanation, but please check with MyChart first.  Please remember to sign up for MyChart if you have not done so, as this will be important to you in the future with finding out test results, communicating by private email, and scheduling acute appointments online when needed.

## 2022-06-07 NOTE — Telephone Encounter (Signed)
Pt states she can not take the HYDROcodone bit-homatropine (HYCODAN) 5-1.5 MG/5ML syrup because the codine upsets her stomach. She is requesting a different medication that does not have codine in it.   Please send new RX to [facility]   Phone:  [phone]  Fax:  [phone]

## 2022-06-09 ENCOUNTER — Emergency Department (HOSPITAL_COMMUNITY)
Admission: EM | Admit: 2022-06-09 | Discharge: 2022-06-10 | Discharge status: Against Medical Advice | Payer: Medicare Other | Attending: Emergency Medicine | Admitting: Emergency Medicine

## 2022-06-09 ENCOUNTER — Encounter (HOSPITAL_COMMUNITY): Payer: Self-pay

## 2022-06-09 ENCOUNTER — Emergency Department (HOSPITAL_COMMUNITY): Payer: Medicare Other

## 2022-06-09 DIAGNOSIS — R404 Transient alteration of awareness: Secondary | ICD-10-CM | POA: Diagnosis not present

## 2022-06-09 DIAGNOSIS — Z743 Need for continuous supervision: Secondary | ICD-10-CM | POA: Diagnosis not present

## 2022-06-09 DIAGNOSIS — R5383 Other fatigue: Secondary | ICD-10-CM | POA: Insufficient documentation

## 2022-06-09 DIAGNOSIS — U071 COVID-19: Secondary | ICD-10-CM | POA: Insufficient documentation

## 2022-06-09 DIAGNOSIS — Z5321 Procedure and treatment not carried out due to patient leaving prior to being seen by health care provider: Secondary | ICD-10-CM | POA: Diagnosis not present

## 2022-06-09 DIAGNOSIS — R059 Cough, unspecified: Secondary | ICD-10-CM | POA: Insufficient documentation

## 2022-06-09 DIAGNOSIS — R197 Diarrhea, unspecified: Secondary | ICD-10-CM | POA: Diagnosis not present

## 2022-06-09 DIAGNOSIS — R0602 Shortness of breath: Secondary | ICD-10-CM | POA: Diagnosis not present

## 2022-06-09 DIAGNOSIS — R531 Weakness: Secondary | ICD-10-CM | POA: Diagnosis not present

## 2022-06-09 LAB — CBC WITH DIFFERENTIAL/PLATELET
Abs Immature Granulocytes: 0.07 10*3/uL (ref 0.00–0.07)
Basophils Absolute: 0 10*3/uL (ref 0.0–0.1)
Basophils Relative: 0 %
Eosinophils Absolute: 0 10*3/uL (ref 0.0–0.5)
Eosinophils Relative: 0 %
HCT: 37.7 % (ref 36.0–46.0)
Hemoglobin: 12.9 g/dL (ref 12.0–15.0)
Immature Granulocytes: 1 %
Lymphocytes Relative: 17 %
Lymphs Abs: 2.2 10*3/uL (ref 0.7–4.0)
MCH: 31.6 pg (ref 26.0–34.0)
MCHC: 34.2 g/dL (ref 30.0–36.0)
MCV: 92.4 fL (ref 80.0–100.0)
Monocytes Absolute: 1 10*3/uL (ref 0.1–1.0)
Monocytes Relative: 7 %
Neutro Abs: 9.6 10*3/uL — ABNORMAL HIGH (ref 1.7–7.7)
Neutrophils Relative %: 75 %
Platelets: 268 10*3/uL (ref 150–400)
RBC: 4.08 MIL/uL (ref 3.87–5.11)
RDW: 13.2 % (ref 11.5–15.5)
WBC: 12.9 10*3/uL — ABNORMAL HIGH (ref 4.0–10.5)
nRBC: 0 % (ref 0.0–0.2)

## 2022-06-09 LAB — BASIC METABOLIC PANEL
Anion gap: 7 (ref 5–15)
BUN: 13 mg/dL (ref 8–23)
CO2: 20 mmol/L — ABNORMAL LOW (ref 22–32)
Calcium: 8.7 mg/dL — ABNORMAL LOW (ref 8.9–10.3)
Chloride: 108 mmol/L (ref 98–111)
Creatinine, Ser: 0.97 mg/dL (ref 0.44–1.00)
GFR, Estimated: >60 mL/min (ref >60)
Glucose, Bld: 79 mg/dL (ref 70–99)
Potassium: 3.7 mmol/L (ref 3.5–5.1)
Sodium: 135 mmol/L (ref 135–145)

## 2022-06-09 NOTE — ED Triage Notes (Signed)
Pt arrived via POV, c/o headache, diarrhea, generalized feeling unwell since being dx with COVID

## 2022-06-09 NOTE — ED Provider Triage Note (Signed)
Emergency Medicine Provider Triage Evaluation Note  HORTENSIA DUFFIN , a 66 y.o. female  was evaluated in triage.  Pt complains of fatigue, diarrhea, cough productive of white sputum.  Patient tested positive for COVID approximately 1 week ago and has had the symptoms since then.  Was seen by her primary care.  She reports they wanted to obtain labs and a chest x-ray, but did not want her to spread COVID so they ended up not performing these.  Review of Systems  Positive: As above Negative: Chest pain, fevers, rigors  Physical Exam  BP 121/74 (BP Location: Left Arm)   Pulse 68   Temp 98.2 F (36.8 C) (Oral)   Resp 16   SpO2 97%  Gen:   Awake, no distress   Resp:  Normal effort  MSK:   Moves extremities without difficulty  Other:    Medical Decision Making  Medically screening exam initiated at 5:13 PM.  Appropriate orders placed.  ADALIDA GARVER was informed that the remainder of the evaluation will be completed by another provider, this initial triage assessment does not replace that evaluation, and the importance of remaining in the ED until their evaluation is complete.     Roylene Reason, [place] 06/09/22 1715

## 2022-06-09 NOTE — Assessment & Plan Note (Signed)
Last vitamin D Lab Results  Component Value Date   VD25OH 22.86 (L) 01/10/2022   Low, reminded to start oral replacement  

## 2022-06-09 NOTE — Assessment & Plan Note (Signed)
Mild to mod worsening, to continue inhaler use, You had the steroid shot today Please take all new medication as prescribed - the antibiotic, prednisone, cough medicine,  to f/u any worsening symptoms or concerns, declines cxr

## 2022-06-09 NOTE — Assessment & Plan Note (Signed)
Lab Results  Component Value Date   CHOL 246 (H) 01/10/2022   HDL 41.40 01/10/2022   LDLCALC Comment 03/12/2017   LDLDIRECT 165.0 01/10/2022   TRIG 328.0 (H) 01/10/2022   CHOLHDL 6 01/10/2022   For low chol diet, declines statin for now

## 2022-06-09 NOTE — Assessment & Plan Note (Signed)
Pt counsled to quit, pt not ready °

## 2022-06-09 NOTE — Assessment & Plan Note (Signed)
Lab Results  Component Value Date   HGBA1C 5.3 01/10/2022   Stable, pt to continue current medical treatment  - diet, wt control, excercise

## 2022-06-09 NOTE — Assessment & Plan Note (Signed)
Lab Results  Component Value Date   VITAMINB12 245 01/10/2022   Low normal, reminded to start oral replacement - b12 1000 mcg qd

## 2022-06-09 NOTE — Assessment & Plan Note (Signed)
New onset symptoms, and high risk for worsening, for paxlovid course,  to f/u any worsening symptoms or concerns

## 2022-06-10 ENCOUNTER — Telehealth: Payer: Self-pay

## 2022-06-10 NOTE — Telephone Encounter (Signed)
Patient is calling in asking for a medication for diarrhea, states she has had it since Friday.

## 2022-06-11 ENCOUNTER — Telehealth: Payer: Self-pay | Admitting: Internal Medicine

## 2022-06-11 MED ORDER — DIPHENOXYLATE-ATROPINE 2.5-0.025 MG PO TABS: 1.0000 | ORAL_TABLET | Freq: Four times a day (QID) | ORAL | 0 refills | Status: DC | PRN

## 2022-06-11 MED ORDER — PROMETHAZINE-DM 6.25-15 MG/5ML PO SYRP: 5.0000 mL | ORAL_SOLUTION | Freq: Four times a day (QID) | ORAL | 0 refills | Status: DC | PRN

## 2022-06-11 NOTE — Telephone Encounter (Signed)
Ok sent promethazine DM - done erx

## 2022-06-11 NOTE — Telephone Encounter (Signed)
Lvm for pt to rtn my call to re-schedule AWV with [facility] on 06/03/22

## 2022-06-11 NOTE — Telephone Encounter (Signed)
Patient is requesting alternative to this medication as it causes upset stomach  HYDROcodone bit-homatropine (HYCODAN) 5-1.5 MG/5ML syrup

## 2022-06-11 NOTE — Telephone Encounter (Signed)
Left detailed message with Dr.Johns response to the diarrhea, erx sent to pharmacy. Informed patient to let us know about worsening symptoms or go to ED or UC

## 2022-06-11 NOTE — Telephone Encounter (Signed)
LVM for pt to rtn my call to re-schedule AWV with [facility] on 06/03/22. Call back # [phone]

## 2022-06-11 NOTE — Telephone Encounter (Signed)
This is most likely covid related diarrhea  Ok for lomotil prn- done erx  Pt should call or go to UC or ED for worsening pain, fever, sob, falls or other unusual symptoms

## 2022-06-12 ENCOUNTER — Ambulatory Visit: Payer: Medicare Other | Admitting: Orthopaedic Surgery

## 2022-06-17 ENCOUNTER — Telehealth: Payer: Self-pay

## 2022-06-17 MED ORDER — PROMETHAZINE-DM 6.25-15 MG/5ML PO SYRP: 5.0000 mL | ORAL_SOLUTION | Freq: Four times a day (QID) | ORAL | 0 refills | Status: DC | PRN

## 2022-06-17 NOTE — Telephone Encounter (Signed)
Ok this is done erx 

## 2022-06-17 NOTE — Telephone Encounter (Signed)
Pt is requesting a refill on: promethazine-dextromethorphan (PROMETHAZINE-DM) 6.25-15 MG/5ML syrup  Pharmacy: [facility], [place] Lori [name] states that it really is helping to break up the congestion in her chest but she needs just a bit more.  Please advise

## 2022-06-17 NOTE — Telephone Encounter (Signed)
You ordered this medication on 9/26

## 2022-06-21 ENCOUNTER — Ambulatory Visit (INDEPENDENT_AMBULATORY_CARE_PROVIDER_SITE_OTHER): Payer: Medicare Other

## 2022-06-21 DIAGNOSIS — J309 Allergic rhinitis, unspecified: Secondary | ICD-10-CM

## 2022-06-24 ENCOUNTER — Telehealth: Payer: Self-pay

## 2022-06-24 ENCOUNTER — Ambulatory Visit: Payer: Medicare Other

## 2022-06-24 NOTE — Telephone Encounter (Signed)
Called patient x3 no answer, no voice mail. Patient may reschedule for the next available appointment.  L.Wilson,LPN

## 2022-06-25 ENCOUNTER — Ambulatory Visit (INDEPENDENT_AMBULATORY_CARE_PROVIDER_SITE_OTHER): Payer: Medicare Other | Admitting: Internal Medicine

## 2022-06-25 ENCOUNTER — Encounter: Payer: Self-pay | Admitting: Internal Medicine

## 2022-06-25 VITALS — BP 122/76 | HR 65 | Temp 98.7°F | Ht 64.0 in | Wt 118.0 lb

## 2022-06-25 DIAGNOSIS — R1030 Lower abdominal pain, unspecified: Secondary | ICD-10-CM

## 2022-06-25 DIAGNOSIS — R1013 Epigastric pain: Secondary | ICD-10-CM | POA: Diagnosis not present

## 2022-06-25 DIAGNOSIS — R030 Elevated blood-pressure reading, without diagnosis of hypertension: Secondary | ICD-10-CM

## 2022-06-25 LAB — URINALYSIS, ROUTINE W REFLEX MICROSCOPIC
Bilirubin Urine: NEGATIVE
Ketones, ur: NEGATIVE
Leukocytes,Ua: NEGATIVE
Nitrite: NEGATIVE
Specific Gravity, Urine: 1.01 (ref 1.000–1.030)
Total Protein, Urine: NEGATIVE
Urine Glucose: NEGATIVE
Urobilinogen, UA: 0.2 (ref 0.0–1.0)
pH: 6 (ref 5.0–8.0)

## 2022-06-25 LAB — BASIC METABOLIC PANEL
BUN: 17 mg/dL (ref 6–23)
CO2: 24 mEq/L (ref 19–32)
Calcium: 9.1 mg/dL (ref 8.4–10.5)
Chloride: 103 mEq/L (ref 96–112)
Creatinine, Ser: 0.94 mg/dL (ref 0.40–1.20)
GFR: 63.51 mL/min (ref >60.00)
Glucose, Bld: 100 mg/dL — ABNORMAL HIGH (ref 70–99)
Potassium: 4.2 mEq/L (ref 3.5–5.1)
Sodium: 137 mEq/L (ref 135–145)

## 2022-06-25 LAB — HEPATIC FUNCTION PANEL
ALT: 11 U/L (ref 0–35)
AST: 12 U/L (ref 0–37)
Albumin: 4 g/dL (ref 3.5–5.2)
Alkaline Phosphatase: 81 U/L (ref 39–117)
Bilirubin, Direct: 0 mg/dL (ref 0.0–0.3)
Total Bilirubin: 0.4 mg/dL (ref 0.2–1.2)
Total Protein: 6.7 g/dL (ref 6.0–8.3)

## 2022-06-25 LAB — CBC WITH DIFFERENTIAL/PLATELET
Basophils Absolute: 0 10*3/uL (ref 0.0–0.1)
Basophils Relative: 0.3 % (ref 0.0–3.0)
Eosinophils Absolute: 0 10*3/uL (ref 0.0–0.7)
Eosinophils Relative: 0 % (ref 0.0–5.0)
HCT: 40.6 % (ref 36.0–46.0)
Hemoglobin: 13.6 g/dL (ref 12.0–15.0)
Lymphocytes Relative: 33.7 % (ref 12.0–46.0)
Lymphs Abs: 2.4 10*3/uL (ref 0.7–4.0)
MCHC: 33.5 g/dL (ref 30.0–36.0)
MCV: 94 fl (ref 78.0–100.0)
Monocytes Absolute: 0.6 10*3/uL (ref 0.1–1.0)
Monocytes Relative: 9 % (ref 3.0–12.0)
Neutro Abs: 4 10*3/uL (ref 1.4–7.7)
Neutrophils Relative %: 57 % (ref 43.0–77.0)
Platelets: 207 10*3/uL (ref 150.0–400.0)
RBC: 4.31 Mil/uL (ref 3.87–5.11)
RDW: 14.2 % (ref 11.5–15.5)
WBC: 7 10*3/uL (ref 4.0–10.5)

## 2022-06-25 LAB — H. PYLORI ANTIBODY, IGG: H Pylori IgG: NEGATIVE

## 2022-06-25 MED ORDER — MONTELUKAST SODIUM 10 MG PO TABS: 10.0000 mg | ORAL_TABLET | Freq: Every day | ORAL | 3 refills | Status: DC

## 2022-06-25 MED ORDER — SUCRALFATE 1 G PO TABS: 1.0000 g | ORAL_TABLET | Freq: Four times a day (QID) | ORAL | 0 refills | Status: DC

## 2022-06-25 MED ORDER — DEXLANSOPRAZOLE 60 MG PO CPDR: 1.0000 | DELAYED_RELEASE_CAPSULE | Freq: Every day | ORAL | 3 refills | Status: DC

## 2022-06-25 NOTE — Assessment & Plan Note (Signed)
High suspicion for gastritis- no nsaid or etoh involved, s/p ccx, now for h pylori ab, protonix 40 qd, and carafate qid x 1 mo

## 2022-06-25 NOTE — Patient Instructions (Signed)
No pneumonia shots needed today  Please take all new medication as prescribed - the carafate for the stomach  Please continue all other medications as before, and refills have been done if requested - the dexilant  Please have the pharmacy call with any other refills you may need  Please keep your appointments with your specialists as you may have planned  Please go to the LAB at the blood drawing area for the tests to be done  You will be contacted by phone if any changes need to be made immediately.  Otherwise, you will receive a letter about your results with an explanation, but please check with MyChart first.  Please remember to sign up for MyChart if you have not done so, as this will be important to you in the future with finding out test results, communicating by private email, and scheduling acute appointments online when needed.  Please make an Appointment to return in 6 months, or sooner if needed, also with Lab Appointment for testing done 3-5 days before at the [facility] (so this is for TWO appointments - please see the scheduling desk as you leave)

## 2022-06-25 NOTE — Progress Notes (Signed)
Patient ID: Lori [name], female   DOB: 1956/01/02, 66 y.o.   MRN: [id number]        Chief Complaint: follow up epigastric pain, and lower mid abd pain, hyperglycemia, elevated BP       HPI:  Lori [name] is a 66 y.o. female here with c/o 2 days onset epigastric pain without radiation, with nausea, no vomiting, and denies fever, chills though may have felt warm. Also has worsening lower mid abd tender sorenss and urinary frequency for several days.   Currently Gaining wt back after lost with illness to 109 lbs, with covid.  Pt denies chest pain, increased sob or doe, wheezing, orthopnea, PND, increased LE swelling, palpitations, dizziness or syncope.   Pt denies polydipsia, polyuria, or new focal neuro s/s.    Wt Readings from Last 3 Encounters:  06/25/22 118 lb (53.5 kg)  06/07/22 111 lb (50.3 kg)  05/07/22 110 lb (49.9 kg)   BP Readings from Last 3 Encounters:  06/25/22 122/76  06/09/22 (!) 132/56  06/07/22 118/60         Past Medical History:  Diagnosis Date   Allergic rhinitis    Anemia    hgb 13.9 on 11/22/21   Ankle fracture 05/2016   Anxiety    Arthritis    Asthma    Barrett esophagus 2007   Chest pain    Hx, no current problems as of 11/22/21.  Recurrent episodes.  Has had 3 negative nuclear stress test, and a completely normal coronary CT angiogram   COPD (chronic obstructive pulmonary disease) with chronic bronchitis    & Emphysema   Depression    Duodenitis without mention of hemorrhage 2007   Esophageal reflux 2007   Esophageal stricture    Full dentures    patient does not wear dentures as of 11/22/21   H/O hiatal hernia    Hiatal hernia 2006,2007   HSV infection    Hyperlipidemia    rx crestor but pt not taking   Multiple fractures    from falls, fx rt. elbow, fx left wrist, bilateral ankles   Pneumonia 10/2016   Restrictive lung disease 06/30/2017   Ulcer    no current problems as of 11/22/21   Past Surgical History:  Procedure Laterality Date    ABDOMINAL HYSTERECTOMY     BSO   APPENDECTOMY     BIOPSY  04/04/2022   Procedure: BIOPSY;  Surgeon: Irving Copas., MD;  Location: [facility];  Service: Gastroenterology;;   CESAREAN SECTION     x 2   CHOLECYSTECTOMY     CHONDROPLASTY Left 01/06/2015   Procedure: CHONDROPLASTY;  Surgeon: Marybelle Killings, MD;  Location: [facility];  Service: Orthopedics;  Laterality: Left;   COLONOSCOPY     CT CTA CORONARY W/CA SCORE W/CM &/OR WO/CM  12/02/2016   CORONARY CALCIUM SCORE 0.  NO EVIDENCE OF CAD.  Normal-sized PA.  Normal pulmonary venous drainage the left atrium.  No ASD or VSD.   ELBOW FRACTURE SURGERY     right   ESOPHAGOGASTRODUODENOSCOPY (EGD) WITH PROPOFOL N/A 04/04/2022   Procedure: ESOPHAGOGASTRODUODENOSCOPY (EGD) WITH PROPOFOL;  Surgeon: Rush Landmark Telford Nab., MD;  Location: [facility];  Service: Gastroenterology;  Laterality: N/A;   EUS N/A 04/04/2022   Procedure: UPPER ENDOSCOPIC ULTRASOUND (EUS) RADIAL;  Surgeon: Irving Copas., MD;  Location: [facility];  Service: Gastroenterology;  Laterality: N/A;   EYE SURGERY Bilateral    cataracts removed   KNEE ARTHROSCOPY WITH EXCISION  PLICA Left 78/24/2353   Procedure: KNEE ARTHROSCOPY WITH EXCISION PLICA;  Surgeon: Marybelle Killings, MD;  Location: [facility];  Service: Orthopedics;  Laterality: Left;   LIGAMENT REPAIR Right 11/28/2021   Procedure: RIGHT THUMB LIGAMENT RECONSTRUCTION AND TENDON INTERPOSITION;  Surgeon: Sherilyn Cooter, MD;  Location: [facility];  Service: Orthopedics;  Laterality: Right;   Lower Extremity Arterial Dopplers  07/06/2013   RABI 1.0, LABI 1.1.; No evidence of significant vascular atherogenic plaque   NECK SURGERY     NM MYOVIEW LTD  09/2014   a) LOW RISK. Normal EF of 65%, no RWMA. NO ISCHEMIA OR INFARCTION.;; b) 05/02/2009: Normal study.  LOW RISK.  No ischemia or infarction.  EF 74%.  Breast attenuation noted.   TONSILLECTOMY     TRANSTHORACIC ECHOCARDIOGRAM   03/28/2021   a) Normal EF 60 to 65%.  GR 1 DD.  No R WMA.  Normal RV size and function.  Normal RAP.  Normal valves. => NORMAL;; b) 04/23/16/2010: Normal study.  LOW RISK.  No ischemia or infarction.  EF 74%.  Breast attenuation noted.17/2010: ([facility]) EF>55%.  Normal wall motion.  Normal valves.   WRIST FRACTURE SURGERY     left, has plate    reports that she has been smoking cigarettes. She started smoking about 44 years ago. She has a 19.50 pack-year smoking history. She has never used smokeless tobacco. She reports that she does not drink alcohol and does not use drugs. family history includes Dementia in her mother; Diabetes in her maternal aunt, maternal grandmother, and another family member; Emphysema in her father; Heart disease in her brother. Allergies  Allergen Reactions   Advil [Ibuprofen] Nausea And Vomiting   Bee Venom Swelling   Codeine Nausea Only and Other (See Comments)    CAUSES ULCERS   Clonidine Derivatives Other (See Comments)    Unknown    Statins Other (See Comments)    unknown   Latex Rash   Levaquin [Levofloxacin] Nausea Only   Propoxyphene N-Acetaminophen Nausea Only   Current Outpatient Medications on File Prior to Visit  Medication Sig Dispense Refill   AIMOVIG 140 MG/ML SOAJ Inject 140 mg into the skin every 30 (thirty) days.     albuterol (PROAIR HFA) 108 (90 Base) MCG/ACT inhaler Inhale 2 puffs into the lungs every 6 (six) hours as needed for wheezing or shortness of breath. 1 each 2   ALPRAZolam (XANAX) 1 MG tablet TAKE 1 TABLET BY MOUTH AT BEDTIME AS NEEDED FOR ANXIETY (Patient taking differently: Take 1 mg by mouth daily as needed for anxiety.) 90 tablet 1   Budeson-Glycopyrrol-Formoterol (BREZTRI AEROSPHERE) 160-9-4.8 MCG/ACT AERO Inhale 2 puffs once a day with spacer to help prevent cough and wheeze. 10.7 g 5   cefdinir (OMNICEF) 300 MG capsule Take 1 capsule (300 mg total) by mouth 2 (two) times daily. 20 capsule 0   cyclobenzaprine (FLEXERIL) 5 MG  tablet Take 5 mg by mouth 3 (three) times daily as needed for muscle spasms.     diphenoxylate-atropine (LOMOTIL) 2.5-0.025 MG tablet Take 1 tablet by mouth 4 (four) times daily as needed for diarrhea or loose stools. 30 tablet 0   EPINEPHrine 0.3 mg/0.3 mL IJ SOAJ injection INJECT AS DIRECTED FOR SEVERE ALLERGIC REACTION 2 each 1   FASENRA 30 MG/ML SOSY INJECT '30MG'$  SUBCUTANEOUSLY  EVERY 8 WEEKS 1 mL 6   fluticasone (FLONASE) 50 MCG/ACT nasal spray Place 2 sprays into both nostrils daily. 16 g 3   gabapentin (NEURONTIN)  600 MG tablet Take by mouth.     Hyoscyamine Sulfate SL (LEVSIN/SL) 0.125 MG SUBL Place 0.125 mg under the tongue 3 (three) times daily as needed. Would begin by taking once daily in AM.  If episode of pain/discomfort arises, then use another for up to 3 tablets a day total 90 tablet 2   meclizine (ANTIVERT) 12.5 MG tablet Take 12.5 mg by mouth 3 (three) times daily as needed for dizziness.     meclizine (ANTIVERT) 25 MG tablet TAKE 1 TABLET BY MOUTH THREE TIMES DAILY AS NEEDED FOR DIZZINESS 40 tablet 0   methocarbamol (ROBAXIN) 500 MG tablet Take 1 tablet (500 mg total) by mouth 4 (four) times daily. 60 tablet 2   naloxone (NARCAN) nasal spray 4 mg/0.1 mL One spray in both nares daily     nitroGLYCERIN (NITROSTAT) 0.4 MG SL tablet Place 1 tablet (0.4 mg total) under the tongue every 5 (five) minutes as needed for chest pain. 25 tablet 1   ondansetron (ZOFRAN) 4 MG tablet Take 1 tablet (4 mg total) by mouth every 6 (six) hours as needed for nausea or vomiting. 12 tablet 0   predniSONE (DELTASONE) 10 MG tablet 3 tabs by mouth per day for 3 days,2tabs per day for 3 days,1tab per day for 3 days 18 tablet 0   promethazine (PHENERGAN) 25 MG tablet Take by mouth.     promethazine-dextromethorphan (PROMETHAZINE-DM) 6.25-15 MG/5ML syrup Take 5 mLs by mouth 4 (four) times daily as needed for cough. 118 mL 0   traMADol (ULTRAM) 50 MG tablet Take 1 tablet (50 mg total) by mouth every 6 (six)  hours as needed. 60 tablet 1   valACYclovir (VALTREX) 1000 MG tablet Take 1 tablet by mouth twice daily (Patient taking differently: Take 1,000 mg by mouth in the morning.) 180 tablet 1   gabapentin (NEURONTIN) 300 MG capsule Take 300 mg by mouth in the morning. (Patient not taking: Reported on 06/25/2022)     Current Facility-Administered Medications on File Prior to Visit  Medication Dose Route Frequency Provider Last Rate Last Admin   Benralizumab SOSY 30 mg  30 mg Subcutaneous Q8 weeks Valentina Shaggy, MD   30 mg at 05/29/22 1008        ROS:  All others reviewed and negative.  Objective        PE:  BP 122/76 (BP Location: Left Arm, Patient Position: Sitting, Cuff Size: Normal)   Pulse 65   Temp 98.7 F (37.1 C) (Oral)   Ht '5\' 4"'$  (1.626 m)   Wt 118 lb (53.5 kg)   SpO2 98%   BMI 20.25 kg/m                 Constitutional: Pt appears in NAD               HENT: Head: NCAT.                Right Ear: External ear normal.                 Left Ear: External ear normal.                Eyes: . Pupils are equal, round, and reactive to light. Conjunctivae and EOM are normal               Nose: without d/c or deformity               Neck: Neck supple. Gross normal ROM  Cardiovascular: Normal rate and regular rhythm.                 Pulmonary/Chest: Effort normal and breath sounds without rales or wheezing.                Abd:  Soft,tender epigastric as well as low mid abd suprapubic, ND, + BS, no organomegaly               Neurological: Pt is alert. At baseline orientation, motor grossly intact               Skin: Skin is warm. No rashes, no other new lesions, LE edema - none               Psychiatric: Pt behavior is normal without agitation   Micro: none  Cardiac tracings I have personally interpreted today:  none  Pertinent Radiological findings (summarize): none   Lab Results  Component Value Date   WBC 7.0 06/25/2022   HGB 13.6 06/25/2022   HCT 40.6  06/25/2022   PLT 207.0 06/25/2022   GLUCOSE 100 (H) 06/25/2022   CHOL 246 (H) 01/10/2022   TRIG 328.0 (H) 01/10/2022   HDL 41.40 01/10/2022   LDLDIRECT 165.0 01/10/2022   LDLCALC Comment 03/12/2017   ALT 11 06/25/2022   AST 12 06/25/2022   NA 137 06/25/2022   K 4.2 06/25/2022   CL 103 06/25/2022   CREATININE 0.94 06/25/2022   BUN 17 06/25/2022   CO2 24 06/25/2022   TSH 1.42 01/10/2022   INR 0.9 08/31/2008   HGBA1C 5.3 01/10/2022   Assessment/Plan:  MANREET KIERNAN is a 66 y.o. White or Caucasian [1] female with  has a past medical history of Allergic rhinitis, Anemia, Ankle fracture (05/2016), Anxiety, Arthritis, Asthma, Barrett esophagus (2007), Chest pain, COPD (chronic obstructive pulmonary disease) with chronic bronchitis, Depression, Duodenitis without mention of hemorrhage (2007), Esophageal reflux (2007), Esophageal stricture, Full dentures, H/O hiatal hernia, Hiatal hernia (5701,7793), HSV infection, Hyperlipidemia, Multiple fractures, Pneumonia (10/2016), Restrictive lung disease (06/30/2017), and Ulcer.  Epigastric pain High suspicion for gastritis- no nsaid or etoh involved, s/p ccx, now for h pylori ab, protonix 40 qd, and carafate qid x 1 mo  Borderline hypertension BP Readings from Last 3 Encounters:  06/25/22 122/76  06/09/22 (!) 132/56  06/07/22 118/60   Stable, pt to continue medical treatment  - diet, wt control, excercise   Lower abdominal pain Cant r/o uti - for ua and culture  Followup: Return in about 6 months (around 12/25/2022).  Cathlean Cower, MD 06/25/2022 8:28 PM [facility]

## 2022-06-25 NOTE — Assessment & Plan Note (Signed)
Cant r/o uti - for ua and culture

## 2022-06-25 NOTE — Assessment & Plan Note (Signed)
BP Readings from Last 3 Encounters:  06/25/22 122/76  06/09/22 (!) 132/56  06/07/22 118/60   Stable, pt to continue medical treatment  - diet, wt control, excercise

## 2022-06-26 DIAGNOSIS — J302 Other seasonal allergic rhinitis: Secondary | ICD-10-CM | POA: Diagnosis not present

## 2022-06-26 LAB — URINE CULTURE

## 2022-06-26 NOTE — Progress Notes (Signed)
VIALS EXP 06-27-23

## 2022-06-27 DIAGNOSIS — J3081 Allergic rhinitis due to animal (cat) (dog) hair and dander: Secondary | ICD-10-CM | POA: Diagnosis not present

## 2022-06-28 ENCOUNTER — Telehealth: Payer: Self-pay | Admitting: Internal Medicine

## 2022-06-28 NOTE — Telephone Encounter (Signed)
Patient is requesting call back about recent lab results because she hasnt received through mail yet at [phone]

## 2022-06-29 ENCOUNTER — Other Ambulatory Visit: Payer: Self-pay | Admitting: Internal Medicine

## 2022-07-01 NOTE — Telephone Encounter (Signed)
Patient called back in and said that pharmacy called, they do not have the medication that Kayenta sent in asked for him to change the prescription but would also like a call back about the results as she hasnt received them in the mail.

## 2022-07-01 NOTE — Telephone Encounter (Signed)
Spoke with patient regarding lab work with results and informed her that she will still receive her letter in the mail.

## 2022-07-02 ENCOUNTER — Other Ambulatory Visit: Payer: Self-pay

## 2022-07-02 ENCOUNTER — Emergency Department (HOSPITAL_COMMUNITY)
Admission: EM | Admit: 2022-07-02 | Discharge: 2022-07-02 | Disposition: A | Discharge status: Home / Self Care | Payer: Medicare Other | Attending: Emergency Medicine | Admitting: Emergency Medicine

## 2022-07-02 ENCOUNTER — Encounter (HOSPITAL_COMMUNITY): Payer: Self-pay

## 2022-07-02 DIAGNOSIS — Z23 Encounter for immunization: Secondary | ICD-10-CM | POA: Diagnosis not present

## 2022-07-02 DIAGNOSIS — S0993XA Unspecified injury of face, initial encounter: Secondary | ICD-10-CM | POA: Diagnosis present

## 2022-07-02 DIAGNOSIS — J449 Chronic obstructive pulmonary disease, unspecified: Secondary | ICD-10-CM | POA: Diagnosis not present

## 2022-07-02 DIAGNOSIS — Z91040 Latex allergy status: Secondary | ICD-10-CM | POA: Diagnosis not present

## 2022-07-02 DIAGNOSIS — G43719 Chronic migraine without aura, intractable, without status migrainosus: Secondary | ICD-10-CM | POA: Diagnosis not present

## 2022-07-02 DIAGNOSIS — S0185XA Open bite of other part of head, initial encounter: Secondary | ICD-10-CM | POA: Diagnosis not present

## 2022-07-02 DIAGNOSIS — J45909 Unspecified asthma, uncomplicated: Secondary | ICD-10-CM | POA: Diagnosis not present

## 2022-07-02 DIAGNOSIS — W540XXA Bitten by dog, initial encounter: Secondary | ICD-10-CM | POA: Insufficient documentation

## 2022-07-02 DIAGNOSIS — Y92009 Unspecified place in unspecified non-institutional (private) residence as the place of occurrence of the external cause: Secondary | ICD-10-CM | POA: Insufficient documentation

## 2022-07-02 DIAGNOSIS — Z7951 Long term (current) use of inhaled steroids: Secondary | ICD-10-CM | POA: Diagnosis not present

## 2022-07-02 DIAGNOSIS — S0181XA Laceration without foreign body of other part of head, initial encounter: Secondary | ICD-10-CM | POA: Insufficient documentation

## 2022-07-02 DIAGNOSIS — T148XXA Other injury of unspecified body region, initial encounter: Secondary | ICD-10-CM

## 2022-07-02 MED ORDER — AMOXICILLIN-POT CLAVULANATE 875-125 MG PO TABS
1.0000 | ORAL_TABLET | Freq: Once | ORAL | Status: AC
  Administered 2022-07-02: 1 via ORAL
  Filled 2022-07-02: qty 1

## 2022-07-02 MED ORDER — TETANUS-DIPHTH-ACELL PERTUSSIS 5-2.5-18.5 LF-MCG/0.5 IM SUSY
0.5000 mL | PREFILLED_SYRINGE | Freq: Once | INTRAMUSCULAR | Status: AC
  Administered 2022-07-02: 0.5 mL via INTRAMUSCULAR
  Filled 2022-07-02: qty 0.5

## 2022-07-02 MED ORDER — AMOXICILLIN-POT CLAVULANATE 875-125 MG PO TABS: 1.0000 | ORAL_TABLET | Freq: Two times a day (BID) | ORAL | 0 refills | Status: DC

## 2022-07-02 NOTE — ED Triage Notes (Signed)
Pt states that yesterday she was taking care of her sister's 32 month old puppy and got bit on the R side of her face. Pt has a mole near her eye that she says he bit and caused bleeding. Dog has not been vaccinated.

## 2022-07-02 NOTE — Discharge Instructions (Signed)
You were seen in the emergency department today for an animal bite.  We upgraded your tetanus and gave you the first dose of your antibiotic.  If send rest your pharmacy.  It is incredibly important that you talk to animal control.  They need to observe the dog and determine whether or not you need to receive the rabies vaccination series. We talked about the risk of developing rabies today.  Make sure you take the entire antibiotic course. You can use ice for swelling, and ibuprofen or tylenol for pain.

## 2022-07-03 NOTE — ED Provider Notes (Signed)
[facility] DEPARTMENT Provider Note   CSN: [id number] Arrival date & time: 07/02/22  1241     History  Chief Complaint  Patient presents with   Animal Bite    Lori [name] is a 66 y.o. female with history of COPD, asthma, anxiety, depression, hiatal hernia, HLD who presents the emergency department complaining of a dog bite.  Patient states that she was at her daughter's house taking care of her 2-monthold puppy, and believes the dog got riled up because it was very hungry.  It bit her on the right side of her face.  She states that the dog is up-to-date on all vaccinations, except for rabies.  Dog is otherwise been acting normally.    Animal Bite      Home Medications Prior to Admission medications   Medication Sig Start Date End Date Taking? Authorizing Provider  amoxicillin-clavulanate (AUGMENTIN) 875-125 MG tablet Take 1 tablet by mouth every 12 (twelve) hours. 07/02/22  Yes Shawnta Schlegel T, PA-C  AIMOVIG 140 MG/ML SOAJ Inject 140 mg into the skin every 30 (thirty) days. 01/09/21   [provider]  albuterol (PROAIR HFA) 108 (90 Base) MCG/ACT inhaler Inhale 2 puffs into the lungs every 6 (six) hours as needed for wheezing or shortness of breath. 05/25/22   Allwardt, ARanda Evens PA-C  ALPRAZolam (XANAX) 1 MG tablet TAKE 1 TABLET BY MOUTH AT BEDTIME AS NEEDED FOR ANXIETY Patient taking differently: Take 1 mg by mouth daily as needed for anxiety. 02/04/22   JBiagio Borg MD  Budeson-Glycopyrrol-Formoterol (BREZTRI AEROSPHERE) 160-9-4.8 MCG/ACT AERO Inhale 2 puffs once a day with spacer to help prevent cough and wheeze. 09/27/21   DAlthea Charon FNP  cefdinir (OMNICEF) 300 MG capsule Take 1 capsule (300 mg total) by mouth 2 (two) times daily. 06/07/22   JBiagio Borg MD  cyclobenzaprine (FLEXERIL) 5 MG tablet Take 5 mg by mouth 3 (three) times daily as needed for muscle spasms. 03/13/22   [provider]  dexlansoprazole (DEXILANT)  60 MG capsule Take 1 capsule (60 mg total) by mouth daily. 06/25/22   JBiagio Borg MD  diphenoxylate-atropine (LOMOTIL) 2.5-0.025 MG tablet Take 1 tablet by mouth 4 (four) times daily as needed for diarrhea or loose stools. 06/11/22   JBiagio Borg MD  EPINEPHrine 0.3 mg/0.3 mL IJ SOAJ injection INJECT AS DIRECTED FOR SEVERE ALLERGIC REACTION 09/27/21   DAlthea Charon FNP  [facility] MG/ML SOSY INJECT '30MG'$  SUBCUTANEOUSLY  EVERY 8 WEEKS 09/04/21   GValentina Shaggy MD  fluticasone ([facility] 50 MCG/ACT nasal spray Place 2 sprays into both nostrils daily. 05/25/22   Allwardt, ARanda Evens PA-C  gabapentin (NEURONTIN) 300 MG capsule Take 300 mg by mouth in the morning. Patient not taking: Reported on 06/25/2022 07/13/20   [provider]  gabapentin (NEURONTIN) 600 MG tablet Take by mouth. 04/30/22   [provider]  Hyoscyamine Sulfate SL (LEVSIN/SL) 0.125 MG SUBL Place 0.125 mg under the tongue 3 (three) times daily as needed. Would begin by taking once daily in AM.  If episode of pain/discomfort arises, then use another for up to 3 tablets a day total 04/04/22   Mansouraty, GTelford Nab, MD  meclizine (ANTIVERT) 12.5 MG tablet Take 12.5 mg by mouth 3 (three) times daily as needed for dizziness.    [provider]  meclizine (ANTIVERT) 25 MG tablet TAKE 1 TABLET BY MOUTH THREE TIMES DAILY AS NEEDED FOR DIZZINESS 06/29/22   JBiagio Borg  MD  methocarbamol (ROBAXIN) 500 MG tablet Take 1 tablet (500 mg total) by mouth 4 (four) times daily. 05/03/22   Biagio Borg, MD  montelukast (SINGULAIR) 10 MG tablet Take 1 tablet (10 mg total) by mouth at bedtime. 06/25/22   Biagio Borg, MD  naloxone [facility]) nasal spray 4 mg/0.1 mL One spray in both nares daily 05/03/22   [provider]  nitroGLYCERIN (NITROSTAT) 0.4 MG SL tablet Place 1 tablet (0.4 mg total) under the tongue every 5 (five) minutes as needed for chest pain. 05/17/21   Deberah Pelton, NP  ondansetron (ZOFRAN) 4  MG tablet Take 1 tablet (4 mg total) by mouth every 6 (six) hours as needed for nausea or vomiting. 05/25/22   Allwardt, Randa Evens, PA-C  predniSONE (DELTASONE) 10 MG tablet 3 tabs by mouth per day for 3 days,2tabs per day for 3 days,1tab per day for 3 days 06/07/22   Biagio Borg, MD  promethazine (PHENERGAN) 25 MG tablet Take by mouth. 01/09/22   [provider]  promethazine-dextromethorphan (PROMETHAZINE-DM) 6.25-15 MG/5ML syrup Take 5 mLs by mouth 4 (four) times daily as needed for cough. 06/17/22   Biagio Borg, MD  sucralfate (CARAFATE) 1 g tablet Take 1 tablet (1 g total) by mouth 4 (four) times daily. 06/25/22   Biagio Borg, MD  traMADol (ULTRAM) 50 MG tablet Take 1 tablet (50 mg total) by mouth every 6 (six) hours as needed. 05/03/22   Biagio Borg, MD  valACYclovir (VALTREX) 1000 MG tablet Take 1 tablet by mouth twice daily Patient taking differently: Take 1,000 mg by mouth in the morning. 07/30/21   Biagio Borg, MD      Allergies    Advil [ibuprofen], Bee venom, Codeine, Clonidine derivatives, Statins, Latex, Levaquin [levofloxacin], and Propoxyphene n-acetaminophen    Review of Systems   Review of Systems  Skin:  Positive for wound.  All other systems reviewed and are negative.   Physical Exam Updated Vital Signs BP 136/69 (BP Location: Right Arm)   Pulse 85   Temp 98 F (36.7 C) (Oral)   Resp 16   Ht '5\' 4"'$  (1.626 m)   Wt 53.5 kg   SpO2 96%   BMI 20.25 kg/m  Physical Exam Vitals and nursing note reviewed.  Constitutional:      Appearance: Normal appearance.  HENT:     Head: Normocephalic and atraumatic.   Eyes:     Conjunctiva/sclera: Conjunctivae normal.  Pulmonary:     Effort: Pulmonary effort is normal. No respiratory distress.  Skin:    General: Skin is warm and dry.  Neurological:     Mental Status: She is alert.  Psychiatric:        Mood and Affect: Mood normal.        Behavior: Behavior normal.     ED Results / Procedures / Treatments    Labs (all labs ordered are listed, but only abnormal results are displayed) Labs Reviewed - No data to display  EKG None  Radiology No results found.  Procedures Procedures    Medications Ordered in ED Medications  Tdap (BOOSTRIX) injection 0.5 mL (0.5 mLs Intramuscular Given 07/02/22 1359)  amoxicillin-clavulanate (AUGMENTIN) 875-125 MG per tablet 1 tablet (1 tablet Oral Given 07/02/22 1358)    ED Course/ Medical Decision Making/ A&P                           Medical Decision  Making Risk Prescription drug management.   This patient is a 66 y.o. female who presents to the ED for concern of dog bite to the face.   Past Medical History / Social History / Additional history: Chart reviewed. Pertinent results include: COPD, asthma, anxiety, depression, hiatal hernia, HLD  Physical Exam: Physical exam performed. The pertinent findings include: Small laceration over mole on the right side of the face  Medications / Treatment: Tdap updated. Given first dose of augmentin.  Considered initiating rabies series, but had thorough discussion with the patient about observing the dog and contacting animal control.  She understands that if animal control has any concerns, she is to return to the ER immediately to start the rabies vaccination.   Disposition: After consideration of the diagnostic results and the patients response to treatment, I feel that emergency department workup does not suggest an emergent condition requiring admission or immediate intervention beyond what has been performed at this time. The plan is: Discharged home with prescription for Augmentin, and strong recommendation to contact animal control. The patient is safe for discharge and has been instructed to return immediately for worsening symptoms, change in symptoms or any other concerns.         Final Clinical Impression(s) / ED Diagnoses Final diagnoses:  Animal bite    Rx / DC Orders ED Discharge  Orders          Ordered    amoxicillin-clavulanate (AUGMENTIN) 875-125 MG tablet  Every 12 hours        07/02/22 1406           Portions of this report may have been transcribed using voice recognition software. Every effort was made to ensure accuracy; however, inadvertent computerized transcription errors may be present.    Estill Cotta 07/03/22 0920    Carmin Muskrat, MD 07/03/22 1248

## 2022-07-08 ENCOUNTER — Other Ambulatory Visit: Payer: Self-pay | Admitting: Internal Medicine

## 2022-07-12 ENCOUNTER — Telehealth: Payer: Self-pay | Admitting: Internal Medicine

## 2022-07-12 NOTE — Telephone Encounter (Signed)
Pt called at the request of [facility] representative. They told her to call to help expedite the forms they sent to provider to be filled out and sent back to [facility]. If needed, pt can be reached at [phone]

## 2022-07-12 NOTE — Telephone Encounter (Signed)
Appt scheduled 07/19/22

## 2022-07-15 ENCOUNTER — Other Ambulatory Visit: Payer: Self-pay | Admitting: General Practice

## 2022-07-18 ENCOUNTER — Ambulatory Visit (INDEPENDENT_AMBULATORY_CARE_PROVIDER_SITE_OTHER): Payer: Medicare Other

## 2022-07-18 DIAGNOSIS — J309 Allergic rhinitis, unspecified: Secondary | ICD-10-CM

## 2022-07-19 ENCOUNTER — Ambulatory Visit: Payer: Medicare Other

## 2022-07-19 NOTE — Telephone Encounter (Signed)
Called patient lvm to return call, to complete AWV. Patient may reschedule for the next available appointment NHA or CMA. -S. Eleaner Dibartolo,LPN 

## 2022-07-24 ENCOUNTER — Ambulatory Visit (INDEPENDENT_AMBULATORY_CARE_PROVIDER_SITE_OTHER): Payer: Medicare Other | Admitting: *Deleted

## 2022-07-24 DIAGNOSIS — J4550 Severe persistent asthma, uncomplicated: Secondary | ICD-10-CM | POA: Diagnosis not present

## 2022-07-25 NOTE — Telephone Encounter (Signed)
Form completion in progress with OV notes attached.

## 2022-07-26 ENCOUNTER — Telehealth: Payer: Self-pay

## 2022-07-26 NOTE — Telephone Encounter (Signed)
FL2 is done and faxed, called pt to let her know form is done and faxed, and she need to drop by the office to sign the charge sheet and pay for the forms getting done. Pt state to tear it up and she does not have not money to pay for it  Faxed did not go through, faxed busy  Will not faxed form till pt ok about it

## 2022-07-26 NOTE — Telephone Encounter (Signed)
Pt FL2 is filled and given to provider to sign, once done it will be faxed

## 2022-08-02 ENCOUNTER — Telehealth: Payer: Self-pay | Admitting: Internal Medicine

## 2022-08-02 ENCOUNTER — Ambulatory Visit (INDEPENDENT_AMBULATORY_CARE_PROVIDER_SITE_OTHER): Payer: Medicare Other

## 2022-08-02 VITALS — Ht 64.0 in | Wt 118.0 lb

## 2022-08-02 DIAGNOSIS — Z0000 Encounter for general adult medical examination without abnormal findings: Secondary | ICD-10-CM

## 2022-08-02 NOTE — Telephone Encounter (Signed)
Caller & Relationship to patient: PT  Call back number: [phone]  Date of last office visit: 06/25/2022  Date of next office visit: 12/27/2022  Medication(s) to be refilled:  cyclobenzaprine (FLEXERIL) 5 MG tablet   meclizine (ANTIVERT) 12.5 MG tablet      Preferred Pharmacy:   [facility] [address] [place]   PT is completely out of both medications.

## 2022-08-02 NOTE — Patient Instructions (Addendum)
Lori [name] , Thank you for taking time to come for your Medicare Wellness Visit. I appreciate your ongoing commitment to your health goals. Please review the following plan we discussed and let me know if I can assist you in the future.   These are the goals we discussed:  Goals       patient would like some in home help/assistance with cooking, cleaning and ADLs completion of client (pt-stated)      Care Coordination Interventions:  Depression screen reviewed  Active Listening utilized with client Discussed in home care needs of client (she needs some help with ADLs and with cleaning) Client spoke of difficulty holding items in her hand. This affects her daily activities Discussed medication procurement of client Discussed food procurement. She has [employer] U card he uses for food and to help pay utility costs Discussed ambulation of client. She said she gets dizzy when walking Discussed family support. She has some support from her daughters Discussed financial needs of client.  Discussed Dual complete program with [employer]        This is a list of the screening recommended for you and due dates:  Health Maintenance  Topic Date Due   COVID-19 Vaccine (4 - Pfizer risk series) 08/14/2020   Zoster (Shingles) Vaccine (2 of 2) 08/03/2022*   Mammogram  01/11/2023*   DEXA scan (bone density measurement)  01/11/2023*   Colon Cancer Screening  01/11/2023*   Medicare Annual Wellness Visit  08/03/2023   Pneumonia Vaccine (3 - PPSV23 or PCV20) 05/14/2026   Flu Shot  Completed   Hepatitis C Screening: USPSTF Recommendation to screen - Ages 18-79 yo.  Completed   HIV Screening  Completed   HPV Vaccine  Aged Out  *Topic was postponed. The date shown is not the original due date.    Advanced directives: NO; Advance directive discussed with you today. I have provided a copy for you to complete at home and have notarized. Once this is complete please bring a copy in to our office so we can scan it  into your chart.  Conditions/risks identified: YES  Next appointment: Follow up in one year for your annual wellness visit.   Preventive Care 66 Years and Older, Female Preventive care refers to lifestyle choices and visits with your health care provider that can promote health and wellness. What does preventive care include? A yearly physical exam. This is also called an annual well check. Dental exams once or twice a year. Routine eye exams. Ask your health care provider how often you should have your eyes checked. Personal lifestyle choices, including: Daily care of your teeth and gums. Regular physical activity. Eating a healthy diet. Avoiding tobacco and drug use. Limiting alcohol use. Practicing safe sex. Taking low-dose aspirin every day. Taking vitamin and mineral supplements as recommended by your health care provider. What happens during an annual well check? The services and screenings done by your health care provider during your annual well check will depend on your age, overall health, lifestyle risk factors, and family history of disease. Counseling  Your health care provider may ask you questions about your: Alcohol use. Tobacco use. Drug use. Emotional well-being. Home and relationship well-being. Sexual activity. Eating habits. History of falls. Memory and ability to understand (cognition). Work and work Statistician. Reproductive health. Screening  You may have the following tests or measurements: Height, weight, and BMI. Blood pressure. Lipid and cholesterol levels. These may be checked every 5 years, or more frequently if  you are over 7 years old. Skin check. Lung cancer screening. You may have this screening every year starting at age 66 if you have a 30-pack-year history of smoking and currently smoke or have quit within the past 15 years. Fecal occult blood test (FOBT) of the stool. You may have this test every year starting at age 55. Flexible  sigmoidoscopy or colonoscopy. You may have a sigmoidoscopy every 5 years or a colonoscopy every 10 years starting at age 18. Hepatitis C blood test. Hepatitis B blood test. Sexually transmitted disease (STD) testing. Diabetes screening. This is done by checking your blood sugar (glucose) after you have not eaten for a while (fasting). You may have this done every 1-3 years. Bone density scan. This is done to screen for osteoporosis. You may have this done starting at age 4. Mammogram. This may be done every 1-2 years. Talk to your health care provider about how often you should have regular mammograms. Talk with your health care provider about your test results, treatment options, and if necessary, the need for more tests. Vaccines  Your health care provider may recommend certain vaccines, such as: Influenza vaccine. This is recommended every year. Tetanus, diphtheria, and acellular pertussis (Tdap, Td) vaccine. You may need a Td booster every 10 years. Zoster vaccine. You may need this after age 53. Pneumococcal 13-valent conjugate (PCV13) vaccine. One dose is recommended after age 32. Pneumococcal polysaccharide (PPSV23) vaccine. One dose is recommended after age 75. Talk to your health care provider about which screenings and vaccines you need and how often you need them. This information is not intended to replace advice given to you by your health care provider. Make sure you discuss any questions you have with your health care provider. Document Released: 09/29/2015 Document Revised: 05/22/2016 Document Reviewed: 07/04/2015 Elsevier Interactive Patient Education  2017 [employer] Prevention in the Home Falls can cause injuries. They can happen to people of all ages. There are many things you can do to make your home safe and to help prevent falls. What can I do on the outside of my home? Regularly fix the edges of walkways and driveways and fix any cracks. Remove anything that  might make you trip as you walk through a door, such as a raised step or threshold. Trim any bushes or trees on the path to your home. Use bright outdoor lighting. Clear any walking paths of anything that might make someone trip, such as rocks or tools. Regularly check to see if handrails are loose or broken. Make sure that both sides of any steps have handrails. Any raised decks and porches should have guardrails on the edges. Have any leaves, snow, or ice cleared regularly. Use sand or salt on walking paths during winter. Clean up any spills in your garage right away. This includes oil or grease spills. What can I do in the bathroom? Use night lights. Install grab bars by the toilet and in the tub and shower. Do not use towel bars as grab bars. Use non-skid mats or decals in the tub or shower. If you need to sit down in the shower, use a plastic, non-slip stool. Keep the floor dry. Clean up any water that spills on the floor as soon as it happens. Remove soap buildup in the tub or shower regularly. Attach bath mats securely with double-sided non-slip rug tape. Do not have throw rugs and other things on the floor that can make you trip. What can I do  in the bedroom? Use night lights. Make sure that you have a light by your bed that is easy to reach. Do not use any sheets or blankets that are too big for your bed. They should not hang down onto the floor. Have a firm chair that has side arms. You can use this for support while you get dressed. Do not have throw rugs and other things on the floor that can make you trip. What can I do in the kitchen? Clean up any spills right away. Avoid walking on wet floors. Keep items that you use a lot in easy-to-reach places. If you need to reach something above you, use a strong step stool that has a grab bar. Keep electrical cords out of the way. Do not use floor polish or wax that makes floors slippery. If you must use wax, use non-skid floor  wax. Do not have throw rugs and other things on the floor that can make you trip. What can I do with my stairs? Do not leave any items on the stairs. Make sure that there are handrails on both sides of the stairs and use them. Fix handrails that are broken or loose. Make sure that handrails are as long as the stairways. Check any carpeting to make sure that it is firmly attached to the stairs. Fix any carpet that is loose or worn. Avoid having throw rugs at the top or bottom of the stairs. If you do have throw rugs, attach them to the floor with carpet tape. Make sure that you have a light switch at the top of the stairs and the bottom of the stairs. If you do not have them, ask someone to add them for you. What else can I do to help prevent falls? Wear shoes that: Do not have high heels. Have rubber bottoms. Are comfortable and fit you well. Are closed at the toe. Do not wear sandals. If you use a stepladder: Make sure that it is fully opened. Do not climb a closed stepladder. Make sure that both sides of the stepladder are locked into place. Ask someone to hold it for you, if possible. Clearly mark and make sure that you can see: Any grab bars or handrails. First and last steps. Where the edge of each step is. Use tools that help you move around (mobility aids) if they are needed. These include: Canes. Walkers. Scooters. Crutches. Turn on the lights when you go into a dark area. Replace any light bulbs as soon as they burn out. Set up your furniture so you have a clear path. Avoid moving your furniture around. If any of your floors are uneven, fix them. If there are any pets around you, be aware of where they are. Review your medicines with your doctor. Some medicines can make you feel dizzy. This can increase your chance of falling. Ask your doctor what other things that you can do to help prevent falls. This information is not intended to replace advice given to you by your  health care provider. Make sure you discuss any questions you have with your health care provider. Document Released: 06/29/2009 Document Revised: 02/08/2016 Document Reviewed: 10/07/2014 Elsevier Interactive Patient Education  2017 [employer].

## 2022-08-02 NOTE — Progress Notes (Signed)
Virtual Visit via Telephone Note  I connected with  Lori [name] on 08/02/22 at  1:00 PM EST by telephone and verified that I am speaking with the correct person using two identifiers.  Location: Patient: Home Provider: [facility] Persons participating in the virtual visit: patient/Nurse Health Advisor   I discussed the limitations, risks, security and privacy concerns of performing an evaluation and management service by telephone and the availability of in person appointments. The patient expressed understanding and agreed to proceed.  Interactive audio and video telecommunications were attempted between this nurse and patient, however failed, due to patient having technical difficulties OR patient did not have access to video capability.  We continued and completed visit with audio only.  Some vital signs may be absent or patient reported.   Sheral Flow, LPN  Subjective:   Lori [name] is a 66 y.o. female who presents for Medicare Annual (Subsequent) preventive examination.  Review of Systems     Cardiac Risk Factors include: advanced age (>77mn, >>13women);dyslipidemia;family history of premature cardiovascular disease;hypertension;sedentary lifestyle;smoking/ tobacco exposure     Objective:    Today's Vitals   08/02/22 1303  Weight: 118 lb (53.5 kg)  Height: '5\' 4"'$  (1.626 m)  PainSc: 0-No pain   Body mass index is 20.25 kg/m.     08/02/2022    1:28 PM 08/02/2022    1:04 PM 07/02/2022    1:19 PM 03/15/2022    4:07 PM 11/28/2021   11:19 AM 11/22/2021    8:17 AM 03/28/2021    7:45 AM  Advanced Directives  Does Patient Have a Medical Advance Directive? No No No No No No No  Would patient like information on creating a medical advance directive? Yes (MAU/Ambulatory/Procedural Areas - Information given) No - Patient declined   No - Patient declined No - Patient declined No - Patient declined    Current Medications (verified) Outpatient Encounter  Medications as of 08/02/2022  Medication Sig   ALPRAZolam (XANAX) 1 MG tablet TAKE 1 TABLET BY MOUTH AT BEDTIME AS NEEDED FOR ANXIETY   AIMOVIG 140 MG/ML SOAJ Inject 140 mg into the skin every 30 (thirty) days.   albuterol (PROAIR HFA) 108 (90 Base) MCG/ACT inhaler Inhale 2 puffs into the lungs every 6 (six) hours as needed for wheezing or shortness of breath.   amoxicillin-clavulanate (AUGMENTIN) 875-125 MG tablet Take 1 tablet by mouth every 12 (twelve) hours.   Budeson-Glycopyrrol-Formoterol (BREZTRI AEROSPHERE) 160-9-4.8 MCG/ACT AERO Inhale 2 puffs once a day with spacer to help prevent cough and wheeze.   cefdinir (OMNICEF) 300 MG capsule Take 1 capsule (300 mg total) by mouth 2 (two) times daily.   cyclobenzaprine (FLEXERIL) 5 MG tablet Take 5 mg by mouth 3 (three) times daily as needed for muscle spasms.   dexlansoprazole (DEXILANT) 60 MG capsule Take 1 capsule (60 mg total) by mouth daily.   diphenoxylate-atropine (LOMOTIL) 2.5-0.025 MG tablet Take 1 tablet by mouth 4 (four) times daily as needed for diarrhea or loose stools.   EPINEPHrine 0.3 mg/0.3 mL IJ SOAJ injection INJECT AS DIRECTED FOR SEVERE ALLERGIC REACTION   FASENRA 30 MG/ML SOSY INJECT '30MG'$  SUBCUTANEOUSLY  EVERY 8 WEEKS   fluticasone (FLONASE) 50 MCG/ACT nasal spray Place 2 sprays into both nostrils daily.   gabapentin (NEURONTIN) 300 MG capsule Take 300 mg by mouth in the morning. (Patient not taking: Reported on 06/25/2022)   gabapentin (NEURONTIN) 600 MG tablet Take by mouth.   Hyoscyamine Sulfate SL (LEVSIN/SL) 0.125 MG  SUBL Place 0.125 mg under the tongue 3 (three) times daily as needed. Would begin by taking once daily in AM.  If episode of pain/discomfort arises, then use another for up to 3 tablets a day total   meclizine (ANTIVERT) 12.5 MG tablet Take 12.5 mg by mouth 3 (three) times daily as needed for dizziness.   meclizine (ANTIVERT) 25 MG tablet TAKE 1 TABLET BY MOUTH THREE TIMES DAILY AS NEEDED FOR DIZZINESS    methocarbamol (ROBAXIN) 500 MG tablet Take 1 tablet (500 mg total) by mouth 4 (four) times daily.   montelukast (SINGULAIR) 10 MG tablet Take 1 tablet (10 mg total) by mouth at bedtime.   naloxone (NARCAN) nasal spray 4 mg/0.1 mL One spray in both nares daily   nitroGLYCERIN (NITROSTAT) 0.4 MG SL tablet PLACE 1 TABLET UNDER THE TONGUE EVERY 5 MINUTES AS NEEDED FOR CHEST PAIN   ondansetron (ZOFRAN) 4 MG tablet Take 1 tablet (4 mg total) by mouth every 6 (six) hours as needed for nausea or vomiting.   predniSONE (DELTASONE) 10 MG tablet 3 tabs by mouth per day for 3 days,2tabs per day for 3 days,1tab per day for 3 days   promethazine (PHENERGAN) 25 MG tablet Take by mouth.   promethazine-dextromethorphan (PROMETHAZINE-DM) 6.25-15 MG/5ML syrup Take 5 mLs by mouth 4 (four) times daily as needed for cough.   sucralfate (CARAFATE) 1 g tablet Take 1 tablet (1 g total) by mouth 4 (four) times daily.   traMADol (ULTRAM) 50 MG tablet Take 1 tablet (50 mg total) by mouth every 6 (six) hours as needed.   valACYclovir (VALTREX) 1000 MG tablet Take 1 tablet by mouth twice daily (Patient taking differently: Take 1,000 mg by mouth in the morning.)   Facility-Administered Encounter Medications as of 08/02/2022  Medication   Benralizumab SOSY 30 mg    Allergies (verified) Advil [ibuprofen], Bee venom, Codeine, Clonidine derivatives, Statins, Latex, Levaquin [levofloxacin], and Propoxyphene n-acetaminophen   History: Past Medical History:  Diagnosis Date   Allergic rhinitis    Anemia    hgb 13.9 on 11/22/21   Ankle fracture 05/2016   Anxiety    Arthritis    Asthma    Barrett esophagus 2007   Chest pain    Hx, no current problems as of 11/22/21.  Recurrent episodes.  Has had 3 negative nuclear stress test, and a completely normal coronary CT angiogram   COPD (chronic obstructive pulmonary disease) with chronic bronchitis    & Emphysema   Depression    Duodenitis without mention of hemorrhage 2007    Esophageal reflux 2007   Esophageal stricture    Full dentures    patient does not wear dentures as of 11/22/21   H/O hiatal hernia    Hiatal hernia 2006,2007   HSV infection    Hyperlipidemia    rx crestor but pt not taking   Multiple fractures    from falls, fx rt. elbow, fx left wrist, bilateral ankles   Pneumonia 10/2016   Restrictive lung disease 06/30/2017   Ulcer    no current problems as of 11/22/21   Past Surgical History:  Procedure Laterality Date   ABDOMINAL HYSTERECTOMY     BSO   APPENDECTOMY     BIOPSY  04/04/2022   Procedure: BIOPSY;  Surgeon: Irving Copas., MD;  Location: [facility] ENDOSCOPY;  Service: Gastroenterology;;   CESAREAN SECTION     x 2   CHOLECYSTECTOMY     CHONDROPLASTY Left 01/06/2015   Procedure: CHONDROPLASTY;  Surgeon: Marybelle Killings, MD;  Location: [place];  Service: Orthopedics;  Laterality: Left;   COLONOSCOPY     CT CTA CORONARY W/CA SCORE W/CM &/OR WO/CM  12/02/2016   CORONARY CALCIUM SCORE 0.  NO EVIDENCE OF CAD.  Normal-sized PA.  Normal pulmonary venous drainage the left atrium.  No ASD or VSD.   ELBOW FRACTURE SURGERY     right   ESOPHAGOGASTRODUODENOSCOPY (EGD) WITH PROPOFOL N/A 04/04/2022   Procedure: ESOPHAGOGASTRODUODENOSCOPY (EGD) WITH PROPOFOL;  Surgeon: Rush Landmark Telford Nab., MD;  Location: [place];  Service: Gastroenterology;  Laterality: N/A;   EUS N/A 04/04/2022   Procedure: UPPER ENDOSCOPIC ULTRASOUND (EUS) RADIAL;  Surgeon: Irving Copas., MD;  Location: [place];  Service: Gastroenterology;  Laterality: N/A;   EYE SURGERY Bilateral    cataracts removed   KNEE ARTHROSCOPY WITH EXCISION PLICA Left 70/35/0093   Procedure: KNEE ARTHROSCOPY WITH EXCISION PLICA;  Surgeon: Marybelle Killings, MD;  Location: [place];  Service: Orthopedics;  Laterality: Left;   LIGAMENT REPAIR Right 11/28/2021   Procedure: RIGHT THUMB LIGAMENT RECONSTRUCTION AND TENDON INTERPOSITION;  Surgeon: Sherilyn Cooter, MD;  Location: [place];  Service: Orthopedics;  Laterality: Right;   Lower Extremity Arterial Dopplers  07/06/2013   RABI 1.0, LABI 1.1.; No evidence of significant vascular atherogenic plaque   NECK SURGERY     NM MYOVIEW LTD  09/2014   a) LOW RISK. Normal EF of 65%, no RWMA. NO ISCHEMIA OR INFARCTION.;; b) 05/02/2009: Normal study.  LOW RISK.  No ischemia or infarction.  EF 74%.  Breast attenuation noted.   TONSILLECTOMY     TRANSTHORACIC ECHOCARDIOGRAM  03/28/2021   a) Normal EF 60 to 65%.  GR 1 DD.  No R WMA.  Normal RV size and function.  Normal RAP.  Normal valves. => NORMAL;; b) 04/23/16/2010: Normal study.  LOW RISK.  No ischemia or infarction.  EF 74%.  Breast attenuation noted.17/2010: ([facility]) EF>55%.  Normal wall motion.  Normal valves.   WRIST FRACTURE SURGERY     left, has plate   Family History  Problem Relation Age of Onset   Emphysema Father    Dementia Mother    Diabetes Maternal Grandmother    Diabetes Maternal Aunt    Diabetes Other        mat. cousin   Heart disease Brother    Colon cancer Neg Hx    Allergic rhinitis Neg Hx    Angioedema Neg Hx    Asthma Neg Hx    Atopy Neg Hx    Eczema Neg Hx    Immunodeficiency Neg Hx    Urticaria Neg Hx    Social History   Socioeconomic History   Marital status: Widowed    Spouse name: [name]   Number of children: 2   Years of education: HS   Highest education level: Not on file  Occupational History   Occupation: disable  Tobacco Use   Smoking status: Every Day    Packs/day: 0.50    Years: 39.00    Total pack years: 19.50    Types: Cigarettes    Start date: 08/06/1977   Smokeless tobacco: Never   Tobacco comments:    Patient has cut back to 2-3 per day as of 11/22/21  Vaping Use   Vaping Use: Never used  Substance and Sexual Activity   Alcohol use: No    Alcohol/week: 0.0 standard drinks of alcohol   Drug use: No   Sexual  activity: Not Currently    Birth control/protection: Surgical    Comment:  Hysterectomy  Other Topics Concern   Not on file  Social History Narrative   Patient lives at home spouse - [name] (Deceased).  Has 2 daughters (58 and 74).   Currently "disabled".   Caffeine Use: tea 4 glasses daily.    Smokes ~1/2 PPD  05/15/15   Walks everyday - no set routine.      [facility] (12/03/16):   Originally from [facility]. 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 Currently has a dog. No bird exposure. No known mold exposure.    Patient is currently disabled.   Social Determinants of Health   Financial Resource Strain: Low Risk  (08/02/2022)   Overall Financial Resource Strain (CARDIA)    Difficulty of Paying Living Expenses: Not hard at all  Food Insecurity: No Food Insecurity (08/02/2022)   Hunger Vital Sign    Worried About Running Out of Food in the Last Year: Never true    Ran Out of Food in the Last Year: Never true  Transportation Needs: No Transportation Needs (08/02/2022)   PRAPARE - Hydrologist (Medical): No    Lack of Transportation (Non-Medical): No  Physical Activity: Inactive (08/02/2022)   Exercise Vital Sign    Days of Exercise per Week: 0 days    Minutes of Exercise per Session: 0 min  Stress: Stress Concern Present (08/02/2022)   [place]    Feeling of Stress : Very much  Social Connections: Unknown (08/02/2022)   Social Connection and Isolation Panel [NHANES]    Frequency of Communication with Friends and Family: More than three times a week    Frequency of Social Gatherings with Friends and Family: Once a week    Attends Religious Services: Patient refused    Active Member of Clubs or Organizations: Patient refused    Attends Archivist Meetings: Patient refused    Marital Status: Widowed    Tobacco Counseling Ready to quit: Not Answered Counseling given: Not Answered Tobacco comments: Patient has cut back to 2-3  per day as of 11/22/21   Clinical Intake:  Pre-visit preparation completed: Yes  Pain : No/denies pain Pain Score: 0-No pain     BMI - recorded: 20.25 Nutritional Status: BMI of 19-24  Normal Nutritional Risks: None Diabetes: No  How often do you need to have someone help you when you read instructions, pamphlets, or other written materials from your doctor or pharmacy?: 1 - Never What is the last grade level you completed in school?: HSG  Diabetic? NO  Interpreter Needed?: No  Information entered by :: Lisette Abu, LPN.   Activities of Daily Living    08/02/2022    1:20 PM 11/22/2021    8:22 AM  In your present state of health, do you have any difficulty performing the following activities:  Hearing? 0   Vision? 0   Difficulty concentrating or making decisions? 1   Walking or climbing stairs? 0   Dressing or bathing? 0   Doing errands, shopping? 0 0  Preparing Food and eating ? N   Using the Toilet? N   In the past six months, have you accidently leaked urine? Y   Do you have problems with loss of bowel control? N   Managing your Medications? N   Managing your Finances? N   Housekeeping or managing your Housekeeping? N  Patient Care Team: Biagio Borg, MD as PCP - General (Internal Medicine) Leonie Man, MD as PCP - Cardiology (Cardiology) Harvie Junior, MD as Referring Physician (Specialist) Harvie Junior, MD as Referring Physician (Specialist) Harvie Junior, MD as Referring Physician (Specialist) Loletha Carrow Kirke Corin, MD as Consulting Physician (Gastroenterology) Orie Rout, MD as Referring Physician (Specialist) Valentina Shaggy, MD as Consulting Physician (Allergy and Immunology) Gregor Hams, MD as Referring Physician (Family Medicine)  Indicate any recent Medical Services you may have received from other than [facility] providers in the past year (date may be approximate).     Assessment:   This is a routine wellness  examination for [place].  Hearing/Vision screen Hearing Screening - Comments:: Denies hearing difficulties   Vision Screening - Comments:: Wears rx glasses    Dietary issues and exercise activities discussed: Current Exercise Habits: The patient does not participate in regular exercise at present, Exercise limited by: respiratory conditions(s);neurologic condition(s);orthopedic condition(s)   Goals Addressed               This Visit's Progress     patient would like some in home help/assistance with cooking, cleaning and ADLs completion of client (pt-stated)        Care Coordination Interventions:  Depression screen reviewed  Active Listening utilized with client Discussed in home care needs of client (she needs some help with ADLs and with cleaning) Client spoke of difficulty holding items in her hand. This affects her daily activities Discussed medication procurement of client Discussed food procurement. She has [employer] U card he uses for food and to help pay utility costs Discussed ambulation of client. She said she gets dizzy when walking Discussed family support. She has some support from her daughters Discussed financial needs of client.  Discussed [facility] complete program with [facility]      Depression Screen    08/02/2022    1:10 PM 05/03/2022   11:13 AM 04/23/2022   12:35 PM 02/12/2022    9:39 AM 01/23/2022   11:05 AM 01/10/2022    2:29 PM 01/10/2022    2:20 PM  PHQ 2/9 Scores  PHQ - 2 Score '2  2 2 6 1 4  '$ PHQ- 9 Score '7  8 13 16  13  '$ Exception Documentation  Patient refusal         Fall Risk    08/02/2022    1:06 PM 02/12/2022    9:38 AM 01/10/2022    2:29 PM 01/10/2022    2:21 PM 05/23/2021   10:40 AM  [place] in the past year? 0 0 0 0 1  Number falls in past yr: 0 0 0 0 1  Injury with Fall? 0 0 0 1 0  Risk for fall due to : No Fall Risks      Follow up Falls prevention discussed        FALL RISK PREVENTION PERTAINING TO THE HOME:  Any stairs in or around  the home? No  If so, are there any without handrails? No  Home free of loose throw rugs in walkways, pet beds, electrical cords, etc? Yes  Adequate lighting in your home to reduce risk of falls? Yes   ASSISTIVE DEVICES UTILIZED TO PREVENT FALLS:  Life alert? No  Use of a cane, walker or w/c? No  Grab bars in the bathroom? No  Shower chair or bench in shower? No  Elevated toilet seat or a handicapped toilet? No  TIMED UP AND GO:  Was the test performed? No . PHONE VISIT   Cognitive Function:    07/02/2017    8:48 AM  MMSE - Mini Mental State Exam  Orientation to time 5  Orientation to Place 5  Registration 3  Attention/ Calculation 3  Recall 2  Language- name 2 objects 2  Language- repeat 1  Language- follow 3 step command 3  Language- read & follow direction 1  Write a sentence 1  Copy design 1  Total score 27        08/02/2022    1:21 PM 08/15/2020   11:15 AM  6CIT Screen  What Year? 0 points 0 points  What month? 0 points 0 points  What time? 0 points 0 points  Count back from 20 0 points 0 points  Months in reverse 0 points 0 points  Repeat phrase 0 points 0 points  Total Score 0 points 0 points    Immunizations Immunization History  Administered Date(s) Administered   Fluad Quad(high Dose 65+) 05/29/2022   Influenza,inj,Quad PF,6+ Mos 12/03/2016, 05/18/2017, 05/20/2018, 05/25/2019   Influenza,inj,Quad PF,6-35 Mos 05/18/2019   Influenza-Unspecified 05/18/2017, 07/17/2018, 05/14/2021   PFIZER(Purple Top)SARS-COV-2 Vaccination 10/18/2019, 11/08/2019, 06/19/2020   Pneumococcal Conjugate-13 12/30/2014, 12/31/2016   Pneumococcal Polysaccharide-23 01/17/2014, 12/31/2017, 05/14/2021   Tdap 09/09/2013, 05/10/2016, 07/02/2022   Zoster Recombinat (Shingrix) 12/15/2016   Zoster, Live 08/31/2013    TDAP status: Up to date  Flu Vaccine status: Up to date  Pneumococcal vaccine status: Up to date  Covid-19 vaccine status: Completed vaccines  Qualifies  for Shingles Vaccine? Yes   Zostavax completed No   Shingrix Completed?: Yes  Screening Tests Health Maintenance  Topic Date Due   COVID-19 Vaccine (4 - Pfizer risk series) 08/14/2020   Zoster Vaccines- Shingrix (2 of 2) 08/03/2022 (Originally 02/09/2017)   MAMMOGRAM  01/11/2023 (Originally 06/17/2018)   DEXA SCAN  01/11/2023 (Originally 08/10/2021)   COLONOSCOPY (Pts 45-45yr Insurance coverage will need to be confirmed)  01/11/2023 (Originally 08/30/2021)   [place]  08/03/2023   Pneumonia Vaccine 66 Years old (39- PPSV23 or PCV20) 05/14/2026   INFLUENZA VACCINE  Completed   Hepatitis C Screening  Completed   HIV Screening  Completed   HPV VACCINES  Aged Out    Health Maintenance  Health Maintenance Due  Topic Date Due   COVID-19 Vaccine (4 - Pfizer risk series) 08/14/2020    Colorectal cancer screening: Type of screening: Colonoscopy. Completed 08/30/2016. Repeat every 5 years  Mammogram status: No longer required due to PATIENT REFUSAL.  Bone Density status: NEVER DONE  Lung Cancer Screening: (Low Dose CT Chest recommended if Age 66-80years, 30 pack-year currently smoking OR have quit w/in 15years.) does qualify.   Lung Cancer Screening Referral: PATIENT REFUSED  Additional Screening:  Hepatitis C Screening: does qualify; Completed 12/20/2020  Vision Screening: Recommended annual ophthalmology exams for early detection of glaucoma and other disorders of the eye. Is the patient up to date with their annual eye exam?  No  Who is the provider or what is the name of the office in which the patient attends annual eye exams? PATIENT REFUSED If pt is not established with a provider, would they like to be referred to a provider to establish care? No .   Dental Screening: Recommended annual dental exams for proper oral hygiene  Community Resource Referral / Chronic Care Management: CRR required this visit?  No   CCM required this visit?  No  Plan:     I have personally reviewed and noted the following in the patient's chart:   Medical and social history Use of alcohol, tobacco or illicit drugs  Current medications and supplements including opioid prescriptions. Patient is not currently taking opioid prescriptions. Functional ability and status Nutritional status Physical activity Advanced directives List of other physicians Hospitalizations, surgeries, and ER visits in previous 12 months Vitals Screenings to include cognitive, depression, and falls Referrals and appointments  In addition, I have reviewed and discussed with patient certain preventive protocols, quality metrics, and best practice recommendations. A written personalized care plan for preventive services as well as general preventive health recommendations were provided to patient.     Sheral Flow, LPN   16/06/9603   Nurse Notes: N/A

## 2022-08-05 ENCOUNTER — Other Ambulatory Visit: Payer: Self-pay | Admitting: Internal Medicine

## 2022-08-06 NOTE — Telephone Encounter (Signed)
Rx request sent to pharmacy.  

## 2022-08-14 ENCOUNTER — Ambulatory Visit (INDEPENDENT_AMBULATORY_CARE_PROVIDER_SITE_OTHER): Payer: Medicare Other | Admitting: Gastroenterology

## 2022-08-14 ENCOUNTER — Ambulatory Visit (INDEPENDENT_AMBULATORY_CARE_PROVIDER_SITE_OTHER): Payer: Medicare Other

## 2022-08-14 ENCOUNTER — Encounter: Payer: Self-pay | Admitting: Gastroenterology

## 2022-08-14 DIAGNOSIS — R140 Abdominal distension (gaseous): Secondary | ICD-10-CM | POA: Diagnosis not present

## 2022-08-14 DIAGNOSIS — Z86010 Personal history of colonic polyps: Secondary | ICD-10-CM | POA: Diagnosis not present

## 2022-08-14 DIAGNOSIS — K2270 Barrett's esophagus without dysplasia: Secondary | ICD-10-CM | POA: Diagnosis not present

## 2022-08-14 DIAGNOSIS — R1084 Generalized abdominal pain: Secondary | ICD-10-CM

## 2022-08-14 DIAGNOSIS — J309 Allergic rhinitis, unspecified: Secondary | ICD-10-CM

## 2022-08-14 MED ORDER — METOCLOPRAMIDE HCL 5 MG PO TABS: ORAL_TABLET | ORAL | 0 refills | Status: DC

## 2022-08-14 MED ORDER — NA SULFATE-K SULFATE-MG SULF 17.5-3.13-1.6 GM/177ML PO SOLN: 1.0000 | Freq: Once | ORAL | 0 refills | Status: AC

## 2022-08-14 NOTE — Progress Notes (Signed)
[facility] progress note:  History: Lori [name] 08/14/2022  Referring provider: Biagio Borg, MD  Reason for consult/chief complaint: Gastroesophageal Reflux (Can't take the Carafate, the pills are too large. ) and Abdominal Pain (Epigastric pain, has a hx of a hiatal hernia)   Subjective  HPI: Lori [name] follows up for abdominal pain.  I last saw her on 05/07/2022 for chronic functional abdominal pain, generalized and present for many years (at least a decade) with bloating and distention with altered bowel habits along with multiple other somatic complaints and chronic pain syndrome.  At the last follow-up she had recently undergone an EUS by Dr. Rush Landmark for biliary ductal dilatation on imaging with normal LFTs.  Small benign-appearing distal pancreatic cystic lesion felt likely to be a sidebranch IPMN.  Exam otherwise notable for hiatal hernia and 2 cm segment of nondysplastic Barrett's.  No H. pylori, no celiac sprue. Impression and plan from that note: "Incidental dilatation of the common bile duct -no pathologic cause on recent EUS.   Chronic stable abdominal pain.  She took hyoscyamine briefly after the EUS but then no longer felt she needed it.   Reflux symptoms under suboptimal control, and she absolutely needs to make every effort to stop smoking.  Without that, acid suppression will have limited effect.  She has an underlying hiatal hernia and I think she is a poor surgical candidate.   Barrett's esophagus, short segment without dysplasia -recall EGD 5 years." ________________________ Lori [name] saw primary care 06/25/2022 for a few days of increased epigastric pain, treated with Protonix Carafate and H. pylori IgG antibody was ordered.  (Result negative)  Lori [name] is here with ongoing generalized abdominal pain, bloating and distention.  She says the pain is severe at times it causes her to sweat and she feels lightheaded at times.  She also has chronic back  and other musculoskeletal pain and says when those things flare it also causes the sweating and dizziness.  She was unable to take Carafate prescribed by PCP because the tablets were too large.  She denies diarrhea constipation or rectal bleeding.  Last EGD Sept 2017 - small hiatal hernia, mild reflux esophagitis - o/w nml Last colon Dec 2017 - three SSP < 36m  ROS:  Review of Systems  Constitutional:  Negative for appetite change and unexpected weight change.  HENT:  Negative for mouth sores and voice change.   Eyes:  Negative for pain and redness.  Respiratory:  Positive for cough. Negative for shortness of breath.   Cardiovascular:  Negative for chest pain and palpitations.  Genitourinary:  Negative for dysuria and hematuria.  Musculoskeletal:  Positive for arthralgias and myalgias.  Skin:  Negative for pallor and rash.  Neurological:  Negative for weakness and headaches.  Hematological:  Negative for adenopathy.  Psychiatric/Behavioral:         Anxiety     Past Medical History: Past Medical History:  Diagnosis Date   Allergic rhinitis    Anemia    hgb 13.9 on 11/22/21   Ankle fracture 05/2016   Anxiety    Arthritis    Asthma    Barrett esophagus 2007   Chest pain    Hx, no current problems as of 11/22/21.  Recurrent episodes.  Has had 3 negative nuclear stress test, and a completely normal coronary CT angiogram   COPD (chronic obstructive pulmonary disease) with chronic bronchitis    & Emphysema   Depression    Duodenitis without mention of hemorrhage 2007  Esophageal reflux 2007   Esophageal stricture    Full dentures    patient does not wear dentures as of 11/22/21   H/O hiatal hernia    Hiatal hernia 2006,2007   HSV infection    Hyperlipidemia    rx crestor but pt not taking   Multiple fractures    from falls, fx rt. elbow, fx left wrist, bilateral ankles   Pneumonia 10/2016   Restrictive lung disease 06/30/2017   Ulcer    no current problems as of 11/22/21      Past Surgical History: Past Surgical History:  Procedure Laterality Date   ABDOMINAL HYSTERECTOMY     BSO   APPENDECTOMY     BIOPSY  04/04/2022   Procedure: BIOPSY;  Surgeon: Irving Copas., MD;  Location: [facility];  Service: Gastroenterology;;   CESAREAN SECTION     x 2   CHOLECYSTECTOMY     CHONDROPLASTY Left 01/06/2015   Procedure: CHONDROPLASTY;  Surgeon: Marybelle Killings, MD;  Location: [place];  Service: Orthopedics;  Laterality: Left;   COLONOSCOPY     CT CTA CORONARY W/CA SCORE W/CM &/OR WO/CM  12/02/2016   CORONARY CALCIUM SCORE 0.  NO EVIDENCE OF CAD.  Normal-sized PA.  Normal pulmonary venous drainage the left atrium.  No ASD or VSD.   ELBOW FRACTURE SURGERY     right   ESOPHAGOGASTRODUODENOSCOPY (EGD) WITH PROPOFOL N/A 04/04/2022   Procedure: ESOPHAGOGASTRODUODENOSCOPY (EGD) WITH PROPOFOL;  Surgeon: Rush Landmark Telford Nab., MD;  Location: [place];  Service: Gastroenterology;  Laterality: N/A;   EUS N/A 04/04/2022   Procedure: UPPER ENDOSCOPIC ULTRASOUND (EUS) RADIAL;  Surgeon: Irving Copas., MD;  Location: [place];  Service: Gastroenterology;  Laterality: N/A;   EYE SURGERY Bilateral    cataracts removed   KNEE ARTHROSCOPY WITH EXCISION PLICA Left 66/02/3015   Procedure: KNEE ARTHROSCOPY WITH EXCISION PLICA;  Surgeon: Marybelle Killings, MD;  Location: [place];  Service: Orthopedics;  Laterality: Left;   LIGAMENT REPAIR Right 11/28/2021   Procedure: RIGHT THUMB LIGAMENT RECONSTRUCTION AND TENDON INTERPOSITION;  Surgeon: Sherilyn Cooter, MD;  Location: [place] [facility];  Service: Orthopedics;  Laterality: Right;   Lower Extremity Arterial Dopplers  07/06/2013   RABI 1.0, LABI 1.1.; No evidence of significant vascular atherogenic plaque   NECK SURGERY     NM MYOVIEW LTD  09/2014   a) LOW RISK. Normal EF of 65%, no RWMA. NO ISCHEMIA OR INFARCTION.;; b) 05/02/2009: Normal study.  LOW RISK.  No ischemia or infarction.  EF  74%.  Breast attenuation noted.   TONSILLECTOMY     TRANSTHORACIC ECHOCARDIOGRAM  03/28/2021   a) Normal EF 60 to 65%.  GR 1 DD.  No R WMA.  Normal RV size and function.  Normal RAP.  Normal valves. => NORMAL;; b) 04/23/16/2010: Normal study.  LOW RISK.  No ischemia or infarction.  EF 74%.  Breast attenuation noted.17/2010: ([facility]) EF>55%.  Normal wall motion.  Normal valves.   WRIST FRACTURE SURGERY     left, has plate     Family History: Family History  Problem Relation Age of Onset   Emphysema Father    Dementia Mother    Diabetes Maternal Grandmother    Diabetes Maternal Aunt    Diabetes Other        mat. cousin   Heart disease Brother    Colon cancer Neg Hx    Allergic rhinitis Neg Hx    Angioedema Neg Hx  Asthma Neg Hx    Atopy Neg Hx    Eczema Neg Hx    Immunodeficiency Neg Hx    Urticaria Neg Hx     Social History: Social History   Socioeconomic History   Marital status: Widowed    Spouse name: [name]   Number of children: 2   Years of education: HS   Highest education level: Not on file  Occupational History   Occupation: disable  Tobacco Use   Smoking status: Every Day    Packs/day: 0.50    Years: 39.00    Total pack years: 19.50    Types: Cigarettes    Start date: 08/06/1977   Smokeless tobacco: Never   Tobacco comments:    Patient has cut back to 2-3 per day as of 11/22/21  Vaping Use   Vaping Use: Never used  Substance and Sexual Activity   Alcohol use: No    Alcohol/week: 0.0 standard drinks of alcohol   Drug use: No   Sexual activity: Not Currently    Birth control/protection: Surgical    Comment: Hysterectomy  Other Topics Concern   Not on file  Social History Narrative   Patient lives at home spouse - [name] (Deceased).  Has 2 daughters (10 and 31).   Currently "disabled".   Caffeine Use: tea 4 glasses daily.    Smokes ~1/2 PPD  05/15/15   Walks everyday - no set routine.      [facility] (12/03/16):   Originally from [facility].  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 Currently has a dog. No bird exposure. No known mold exposure.    Patient is currently disabled.   Social Determinants of Health   Financial Resource Strain: Low Risk  (08/02/2022)   Overall Financial Resource Strain (CARDIA)    Difficulty of Paying Living Expenses: Not hard at all  Food Insecurity: No Food Insecurity (08/02/2022)   Hunger Vital Sign    Worried About Running Out of Food in the Last Year: Never true    Ran Out of Food in the Last Year: Never true  Transportation Needs: No Transportation Needs (08/02/2022)   PRAPARE - Hydrologist (Medical): No    Lack of Transportation (Non-Medical): No  Physical Activity: Inactive (08/02/2022)   Exercise Vital Sign    Days of Exercise per Week: 0 days    Minutes of Exercise per Session: 0 min  Stress: Stress Concern Present (08/02/2022)   Lori [name]    Feeling of Stress : Very much  Social Connections: Unknown (08/02/2022)   Social Connection and Isolation Panel [NHANES]    Frequency of Communication with Friends and Family: More than three times a week    Frequency of Social Gatherings with Friends and Family: Once a week    Attends Religious Services: Patient refused    Active Member of Clubs or Organizations: Patient refused    Attends Archivist Meetings: Patient refused    Marital Status: Widowed    Allergies: Allergies  Allergen Reactions   Advil [Ibuprofen] Nausea And Vomiting   Bee Venom Swelling   Codeine Nausea Only and Other (See Comments)    CAUSES ULCERS   Clonidine Derivatives Other (See Comments)    Unknown    Statins Other (See Comments)    unknown   Latex Rash   Levaquin [Levofloxacin] Nausea Only   Propoxyphene N-Acetaminophen Nausea Only    Outpatient Meds: Current Outpatient  Medications  Medication Sig Dispense Refill   ALPRAZolam  (XANAX) 1 MG tablet TAKE 1 TABLET BY MOUTH AT BEDTIME AS NEEDED FOR ANXIETY 90 tablet 1   metoCLOPramide (REGLAN) 5 MG tablet Take 1 tablet 30 minutes before each bowel prep 2 tablet 0   Na Sulfate-K Sulfate-Mg Sulf 17.5-3.13-1.6 GM/177ML SOLN Take 1 kit by mouth once for 1 dose. 354 mL 0   AIMOVIG 140 MG/ML SOAJ Inject 140 mg into the skin every 30 (thirty) days.     albuterol (PROAIR HFA) 108 (90 Base) MCG/ACT inhaler Inhale 2 puffs into the lungs every 6 (six) hours as needed for wheezing or shortness of breath. 1 each 2   amoxicillin-clavulanate (AUGMENTIN) 875-125 MG tablet Take 1 tablet by mouth every 12 (twelve) hours. 14 tablet 0   Budeson-Glycopyrrol-Formoterol (BREZTRI AEROSPHERE) 160-9-4.8 MCG/ACT AERO Inhale 2 puffs once a day with spacer to help prevent cough and wheeze. 10.7 g 5   cefdinir (OMNICEF) 300 MG capsule Take 1 capsule (300 mg total) by mouth 2 (two) times daily. 20 capsule 0   cyclobenzaprine (FLEXERIL) 5 MG tablet Take 1 tablet by mouth three times daily as needed for muscle spasm 30 tablet 0   dexlansoprazole (DEXILANT) 60 MG capsule Take 1 capsule (60 mg total) by mouth daily. 90 capsule 3   diphenoxylate-atropine (LOMOTIL) 2.5-0.025 MG tablet Take 1 tablet by mouth 4 (four) times daily as needed for diarrhea or loose stools. 30 tablet 0   EPINEPHrine 0.3 mg/0.3 mL IJ SOAJ injection INJECT AS DIRECTED FOR SEVERE ALLERGIC REACTION 2 each 1   FASENRA 30 MG/ML SOSY INJECT 30MG SUBCUTANEOUSLY  EVERY 8 WEEKS 1 mL 6   fluticasone (FLONASE) 50 MCG/ACT nasal spray Place 2 sprays into both nostrils daily. 16 g 3   gabapentin (NEURONTIN) 300 MG capsule Take 300 mg by mouth in the morning. (Patient not taking: Reported on 06/25/2022)     gabapentin (NEURONTIN) 600 MG tablet Take by mouth.     Hyoscyamine Sulfate SL (LEVSIN/SL) 0.125 MG SUBL Place 0.125 mg under the tongue 3 (three) times daily as needed. Would begin by taking once daily in AM.  If episode of pain/discomfort arises,  then use another for up to 3 tablets a day total 90 tablet 2   meclizine (ANTIVERT) 12.5 MG tablet Take 12.5 mg by mouth 3 (three) times daily as needed for dizziness.     meclizine (ANTIVERT) 25 MG tablet TAKE 1 TABLET BY MOUTH THREE TIMES DAILY AS NEEDED FOR DIZZINESS 40 tablet 0   methocarbamol (ROBAXIN) 500 MG tablet Take 1 tablet (500 mg total) by mouth 4 (four) times daily. 60 tablet 2   montelukast (SINGULAIR) 10 MG tablet Take 1 tablet (10 mg total) by mouth at bedtime. 90 tablet 3   naloxone (NARCAN) nasal spray 4 mg/0.1 mL One spray in both nares daily     nitroGLYCERIN (NITROSTAT) 0.4 MG SL tablet PLACE 1 TABLET UNDER THE TONGUE EVERY 5 MINUTES AS NEEDED FOR CHEST PAIN 25 tablet 3   ondansetron (ZOFRAN) 4 MG tablet Take 1 tablet (4 mg total) by mouth every 6 (six) hours as needed for nausea or vomiting. 12 tablet 0   predniSONE (DELTASONE) 10 MG tablet 3 tabs by mouth per day for 3 days,2tabs per day for 3 days,1tab per day for 3 days 18 tablet 0   promethazine (PHENERGAN) 25 MG tablet Take by mouth.     promethazine-dextromethorphan (PROMETHAZINE-DM) 6.25-15 MG/5ML syrup Take 5 mLs by mouth 4 (four)  times daily as needed for cough. 118 mL 0   sucralfate (CARAFATE) 1 g tablet Take 1 tablet (1 g total) by mouth 4 (four) times daily. 120 tablet 0   traMADol (ULTRAM) 50 MG tablet Take 1 tablet (50 mg total) by mouth every 6 (six) hours as needed. 60 tablet 1   valACYclovir (VALTREX) 1000 MG tablet Take 1 tablet by mouth twice daily (Patient taking differently: Take 1,000 mg by mouth in the morning.) 180 tablet 1   Current Facility-Administered Medications  Medication Dose Route Frequency Provider Last Rate Last Admin   Benralizumab SOSY 30 mg  30 mg Subcutaneous Q8 weeks Valentina Shaggy, MD   30 mg at 07/24/22 1122      ___________________________________________________________________ Objective   Exam:  There were no vitals taken for this visit. Wt Readings from Last 3  Encounters:  08/02/22 118 lb (53.5 kg)  07/02/22 118 lb (53.5 kg)  06/25/22 118 lb (53.5 kg)   A friend of hers is present for the entire visit. General: Holding her abdomen in different locations while sitting and getting up in the exam table.  She has back pain with changes of position. Eyes: sclera anicteric, no redness ENT: oral mucosa moist without lesions, no cervical or supraclavicular lymphadenopathy CV: Regular without murmur, no JVD, no peripheral edema Resp: clear to auscultation bilaterally, normal RR and effort noted GI: soft, diffuse tenderness with an exaggerated response to light palpation of the abdominal wall, with active bowel sounds. No guarding or palpable organomegaly noted. Skin; warm and dry, no rash or jaundice noted Neuro: awake, alert and oriented x 3. Normal gross motor function and fluent speech Antalgic gait Labs:  Negative H. pylori IgG antibody as noted above  Radiologic Studies:  CLINICAL DATA:  Epigastric pain.   EXAM: CT ABDOMEN AND PELVIS WITH CONTRAST   TECHNIQUE: Multidetector CT imaging of the abdomen and pelvis was performed using the standard protocol following bolus administration of intravenous contrast.   RADIATION DOSE REDUCTION: This exam was performed according to the departmental dose-optimization program which includes automated exposure control, adjustment of the mA and/or kV according to patient size and/or use of iterative reconstruction technique.   CONTRAST:  168m ISOVUE-300 IOPAMIDOL (ISOVUE-300) INJECTION 61%   COMPARISON:  CT abdomen and pelvis 11/14/2017.   FINDINGS: Lower chest: Mild emphysematous changes are present.   Hepatobiliary: Gallbladder surgically absent. There is dilatation of the common bile duct measuring up to 15 mm. This is increased from prior. There is also new mild intrahepatic biliary ductal dilatation. No focal liver lesions are identified.   Pancreas: Unremarkable. No pancreatic ductal  dilatation or surrounding inflammatory changes.   Spleen: Normal in size without focal abnormality.   Adrenals/Urinary Tract: Adrenal glands are unremarkable. Kidneys are normal, without renal calculi, focal lesion, or hydronephrosis. Bladder is unremarkable.   Stomach/Bowel: Stomach is within normal limits. Appendix is not seen. No evidence of bowel wall thickening, distention, or inflammatory changes.   Vascular/Lymphatic: Aortic atherosclerosis. No enlarged abdominal or pelvic lymph nodes.   Reproductive: Status post hysterectomy. No adnexal masses.   Other: No abdominal wall hernia or abnormality. No abdominopelvic ascites.   Musculoskeletal: No acute or significant osseous findings.   IMPRESSION: 1. Patient is status post cholecystectomy. There is intra and extrahepatic biliary ductal dilatation which is new/increased from prior examination. Findings are indeterminate. Distal biliary obstruction not excluded. Recommend correlation with lab values. This can be further evaluated with MRCP or ERCP. 2. Aortic Atherosclerosis (ICD10-I70.0) and Emphysema (  ICD10-J43.9).     Electronically Signed   By: Ronney Asters M.D.   On: 01/17/2022 22:39  Assessment: Encounter Diagnoses  Name Primary?   Generalized abdominal pain Yes   Personal history of colonic polyps    Abdominal bloating    Barrett's esophagus without dysplasia     Kaizley has longstanding generalized functional abdominal pain in the setting of a chronic pain syndrome that I believe is stable.  She has had an extensive workup over the years including recent endoscopy, EUS and CT abdomen and pelvis earlier this year.  I do not think she has peptic ulcer, obstruction, malignancy.  I did my best to explain to her that we have not discovered any clear digestive disorder to explain this and thus I do not have any other specific treatment offer for it.  She is on treatment for chronic neuropathic pain, anxiety and I  suspect there is probably not additional medicines for her pain syndrome to be offered by primary care.  She is due for surveillance colonoscopy and wished to schedule that.  While she was disappointed by my assessment noted above, afterward she seemed to relax, was no longer holding her abdomen, her affect improved and she lookedf forward to scheduling her procedure.   Thank you for the courtesy of this consult.  Please call me with any questions or concerns.  Nelida Meuse III  CC: Referring provider noted above

## 2022-08-14 NOTE — Patient Instructions (Signed)
_______________________________________________________  If you are age 66 or older, your body mass index should be between 23-30. Your There is no height or weight on file to calculate BMI. If this is out of the aforementioned range listed, please consider follow up with your Primary Care Provider.  If you are age 31 or younger, your body mass index should be between 19-25. Your There is no height or weight on file to calculate BMI. If this is out of the aformentioned range listed, please consider follow up with your Primary Care Provider.   ________________________________________________________  The [facility] providers would like to encourage you to use [facility] to communicate with providers for non-urgent requests or questions.  Due to long hold times on the telephone, sending your provider a message by [facility] may be a faster and more efficient way to get a response.  Please allow 48 business hours for a response.  Please remember that this is for non-urgent requests.  _______________________________________________________  Dennis Bast have been scheduled for a colonoscopy. Please follow written instructions given to you at your visit today.  Please pick up your prep supplies at the pharmacy within the next 1-3 days. If you use inhalers (even only as needed), please bring them with you on the day of your procedure.  Due to recent changes in healthcare laws, you may see the results of your imaging and laboratory studies on MyChart before your provider has had a chance to review them.  We understand that in some cases there may be results that are confusing or concerning to you. Not all laboratory results come back in the same time frame and the provider may be waiting for multiple results in order to interpret others.  Please give [place] 48 hours in order for your provider to thoroughly review all the results before contacting the office for clarification of your results.   It was a pleasure to see you  today!  Thank you for trusting me with your gastrointestinal care!

## 2022-08-26 ENCOUNTER — Telehealth: Payer: Self-pay | Admitting: *Deleted

## 2022-08-26 NOTE — Telephone Encounter (Signed)
L/ for patient needs MD appt for reapproval of Berna Bue

## 2022-09-03 DIAGNOSIS — G43719 Chronic migraine without aura, intractable, without status migrainosus: Secondary | ICD-10-CM | POA: Diagnosis not present

## 2022-09-04 ENCOUNTER — Other Ambulatory Visit: Payer: Self-pay | Admitting: Internal Medicine

## 2022-09-05 ENCOUNTER — Encounter: Payer: Self-pay | Admitting: Allergy & Immunology

## 2022-09-05 ENCOUNTER — Ambulatory Visit (INDEPENDENT_AMBULATORY_CARE_PROVIDER_SITE_OTHER): Payer: Medicare Other | Admitting: Allergy & Immunology

## 2022-09-05 ENCOUNTER — Other Ambulatory Visit: Payer: Self-pay

## 2022-09-05 VITALS — BP 138/78 | HR 68 | Temp 97.0°F | Resp 16 | Ht 64.0 in | Wt 117.5 lb

## 2022-09-05 DIAGNOSIS — J3089 Other allergic rhinitis: Secondary | ICD-10-CM | POA: Diagnosis not present

## 2022-09-05 DIAGNOSIS — M549 Dorsalgia, unspecified: Secondary | ICD-10-CM | POA: Diagnosis not present

## 2022-09-05 DIAGNOSIS — J302 Other seasonal allergic rhinitis: Secondary | ICD-10-CM

## 2022-09-05 DIAGNOSIS — J4489 Other specified chronic obstructive pulmonary disease: Secondary | ICD-10-CM

## 2022-09-05 MED ORDER — LIDOCAINE 5 % EX PTCH: 1.0000 | MEDICATED_PATCH | CUTANEOUS | 0 refills | Status: DC

## 2022-09-05 MED ORDER — METHYLPREDNISOLONE ACETATE 40 MG/ML IJ SUSP
40.0000 mg | Freq: Once | INTRAMUSCULAR | Status: AC
  Administered 2022-09-05: 40 mg via INTRAMUSCULAR

## 2022-09-05 NOTE — Patient Instructions (Addendum)
1. Moderate persistent asthma, uncomplicated - Lung testing looked good today. - We are not going to make any changes.  - We will make sure that you get Fasenra back on board.  - You are due for a shot at the beginning of January.  - Daily controller medication(s): Singulair (montelukast) '10mg'$  daily + Breztri two puffs once daily with spacer + Fasenra every 8 weeks.   - Rescue medications: Xopenex 4 puffs every 4-6 hours as needed or Xopenex nebulizer one vial puffs every 4-6 hours as needed - Asthma control goals:  * Full participation in all desired activities (may need albuterol before activity) * Albuterol use two time or less a week on average (not counting use with activity) * Cough interfering with sleep two time or less a month * Oral steroids no more than once a year * No hospitalizations   2. Perennial allergic rhinitis  - Continue with shots every four weeks.  - Continue with Singulair '10mg'$  daily. - Continue with cetirizine (Zyrtec) '10mg'$  daily.  3. Back injury - DepoMedrol '40mg'$  given in clinic. - I sent in LidoDerm patches for your back to help you out.  - You can reach out to your PCP for more help with that.   4.  Return in about 6 months (around 03/07/2023).   Please inform us of any Emergency Department visits, hospitalizations, or changes in symptoms. Call us before going to the ED for breathing or allergy symptoms since we might be able to fit you in for a sick visit. Feel free to contact us anytime with any questions, problems, or concerns.  It was a pleasure to see you again today!   Websites that have reliable patient information: 1. American Academy of Asthma, Allergy, and Immunology: www.aaaai.org 2. Food Allergy Research and Education (FARE): foodallergy.org 3. Mothers of Asthmatics: http://www.asthmacommunitynetwork.org 4. American College of Allergy, Asthma, and Immunology: www.acaai.org

## 2022-09-05 NOTE — Progress Notes (Signed)
FOLLOW UP  Date of Service/Encounter:  09/05/22   Assessment:   Seasonal and perennial allergic rhinitis (mold, cat, dog) - on allergen immunotherapy every 2 weeks with good results   Cigarette smoker   Moderate persistent asthma with COPD overlap - seems stabilized on Fasenra    Complex medical history   Recent death of spouse Jun 28, 2019) and brother (March 2021)  Recent back injury - gave DepoMedrol and LidoDerm patches today  Plan/Recommendations:   1. Moderate persistent asthma, uncomplicated - Lung testing looked good today. - We are not going to make any changes.  - We will make sure that you get Fasenra back on board.  - You are due for a shot at the beginning of January.  - Daily controller medication(s): Singulair (montelukast) '10mg'$  daily + Breztri two puffs once daily with spacer + Fasenra every 8 weeks.   - Rescue medications: Xopenex 4 puffs every 4-6 hours as needed or Xopenex nebulizer one vial puffs every 4-6 hours as needed - Asthma control goals:  * Full participation in all desired activities (may need albuterol before activity) * Albuterol use two time or less a week on average (not counting use with activity) * Cough interfering with sleep two time or less a month * Oral steroids no more than once a year * No hospitalizations   2. Perennial allergic rhinitis  - Continue with shots every four weeks.  - Continue with Singulair '10mg'$  daily. - Continue with cetirizine (Zyrtec) '10mg'$  daily.  3. Back injury - DepoMedrol '40mg'$  given in clinic. - I sent in LidoDerm patches for your back to help you out.  - You can reach out to your PCP for more help with that.   4.  Return in about 6 months (around 03/07/2023).  Subjective:   Lori [name] is a 66 y.o. female presenting today for follow up of  Chief Complaint  Patient presents with   Other    Pt states they she is here for an asthma shot.   Medication Management    Lori [name] has a  history of the following: Patient Active Problem List   Diagnosis Date Noted   Lower abdominal pain 06/25/2022   COVID-19 virus infection 06/09/2022   Pain in left ankle and joints of left foot 05/15/2022   Weight loss 01/10/2022   Arthritis of carpometacarpal [facility]) joint of right thumb 10/25/2021   Right-sided chest pain 09/05/2021   Neck pain 04/11/2021   Thoracic spine pain 04/11/2021   Low back pain 04/11/2021   Bilateral hip pain 04/11/2021   AKI (acute kidney injury) ([place]) 04/11/2021   Allergic rhinitis 01/08/2021   Abnormal urine odor 11/14/2020   Aortic atherosclerosis ([place]) 11/14/2020   Severe persistent asthma without complication 01/75/1025   Hymenoptera allergy 11/02/2020   Genital herpes 11/12/2019   Coccyx pain 11/12/2019   Insomnia 11/12/2019   Acute right-sided low back pain without sciatica 07/20/2019   Grief 07/20/2019   Vitamin D deficiency 07/20/2019   Hyperglycemia 04/21/2019   Left knee pain 04/21/2019   Pain 12/29/2018   B12 deficiency 10/20/2018   Bilateral hand pain 08/03/2018   Nausea 04/27/2018   Anemia, iron deficiency 04/27/2018   Intermittent paresthesia of hand and foot 04/27/2018   Headache 03/21/2018   Lumbar radiculitis 08/15/2017   Degenerative disc disease, lumbar 07/21/2017   Encounter for well adult exam with abnormal findings 06/30/2017   Epigastric pain 06/30/2017   Restrictive lung disease 06/30/2017   Leg cramping  06/09/2017   Elbow contusion 06/09/2017   Right elbow pain 05/27/2017   Asthma-COPD overlap syndrome 01/31/2017   Seasonal and perennial allergic rhinitis 01/01/2017   Moderate persistent asthma, uncomplicated 86/57/8469   Pulmonary emphysema ([place]) 12/03/2016   Tobacco abuse 12/03/2016   Non-seasonal allergic rhinitis due to fungal spores 12/03/2016   Medication management 11/29/2016   Rash 11/23/2016   Vertigo 10/22/2016   Gastroesophageal reflux disease with esophagitis 10/22/2016   Non compliance w medication  regimen 08/01/2016   Allergic contact dermatitis due to metals 08/01/2016   Migraine without aura and without status migrainosus, not intractable 07/22/2016   Spasm of muscle of lower back 06/21/2016   Chronic rhinitis 06/19/2016   Compliance poor 06/19/2016   Gait difficulty 06/21/2015   Fibromyalgia 06/21/2015   Conversion reaction 06/21/2015   Depression 06/21/2015   Syncope and collapse 62/95/2841   Plica syndrome of left knee 01/06/2015   Anxiety state 09/06/2014   History of MI 09/06/2014   Cigarette smoker 06/27/2013   Borderline hypertension 06/27/2013   Chronic low back pain with sciatica 06/14/2013   Leg pain, bilateral 06/14/2013   Gastroparesis 10/26/2009   Musculoskeletal chest pain 05/02/2009   Hyperlipidemia LDL goal <100 11/18/2008   COPD exacerbation ([place]) 11/18/2008   HEPATIC CYST 11/18/2008    History obtained from: chart review and patient.  Lori [name] is a 66 y.o. female presenting for a follow up visit.  He was last seen in February 2023.  At that time, her lung testing was not done, but she sounded pristine.  We recommended starting budesonide mixed with Xopenex 3 times daily.  We started Mucinex 1200 mg twice daily for 1 to 2 weeks.  We did not feel that she needed an antibiotic at that time.  We continue with Singulair as well as Judithann Sauger and [employer].  For her rhinitis, we will continue with allergy shots as well as Singulair and Zyrtec.  Since the last visit, she has mostly done well.   Asthma/Respiratory Symptom History: Breathing is good. She is doing well with the [place]. She remains on her Breztri. This seems to be working well for her. I have not gotten prednisone since the last time that I saw her.  She has done very well with the Symbicort and the Spiriva. She thinks that the Berna Bue has helped a lot with her symptoms. She has not been to the ED or needed prednisone since the last visit.   Allergic Rhinitis Symptom History: She is getting her shots every  four weeks. These seem to be working well for her symptoms. She remains on her montelukast and cetirizine.  She has not been on antibiotics at all since the last visit.   She has recently had a back injury. She up from the couch and fell down. She ended up falling and hurt her back this past weekend. She was just getting up from the couch. She did not go to the ED or to see her PCP. She is not having trouble walking. She denies any paresthesias or urinary problems.  The pain is localized to her back midline area right above her gluteal cleft.  There is no radiation of the pain.  She has two girls that "don't give a shit about me". She is still hangs out a lot with her friend [name]. They are thinking of moving in together to a town home. Her friend [name] has a "spot" on her kidneys and her lungs.   Otherwise, there have been no changes  to her past medical history, surgical history, family history, or social history.    Review of Systems  Constitutional: Negative.  Negative for chills, fever, malaise/fatigue and weight loss.  HENT:  Negative for congestion, ear discharge and ear pain.   Eyes:  Negative for pain, discharge and redness.  Respiratory:  Negative for cough, sputum production, shortness of breath and wheezing.   Cardiovascular: Negative.  Negative for chest pain and palpitations.  Gastrointestinal:  Negative for abdominal pain, constipation, diarrhea, heartburn, nausea and vomiting.  Skin: Negative.  Negative for itching and rash.  Neurological:  Negative for dizziness and headaches.  Endo/Heme/Allergies:  Positive for environmental allergies. Does not bruise/bleed easily.       Objective:   Blood pressure 138/78, pulse 68, temperature (!) 97 F (36.1 C), temperature source Temporal, resp. rate 16, height '5\' 4"'$  (1.626 m), weight 117 lb 8 oz (53.3 kg), SpO2 98 %. Body mass index is 20.17 kg/m.    Physical Exam Vitals reviewed.  Constitutional:      Appearance: Normal  appearance. She is well-developed.  HENT:     Head: Normocephalic and atraumatic.     Right Ear: Tympanic membrane, ear canal and external ear normal.     Left Ear: Tympanic membrane, ear canal and external ear normal.     Nose: No nasal deformity, septal deviation, mucosal edema or rhinorrhea.     Right Turbinates: Enlarged and swollen.     Left Turbinates: Enlarged and swollen.     Right Sinus: No maxillary sinus tenderness or frontal sinus tenderness.     Left Sinus: No maxillary sinus tenderness or frontal sinus tenderness.     Mouth/Throat:     Mouth: Mucous membranes are not pale and not dry.     Pharynx: Uvula midline.  Eyes:     General: Lids are normal. No allergic shiner.       Right eye: No discharge.        Left eye: No discharge.     Conjunctiva/sclera: Conjunctivae normal.     Right eye: Right conjunctiva is not injected. No chemosis.    Left eye: Left conjunctiva is not injected. No chemosis.    Pupils: Pupils are equal, round, and reactive to light.  Cardiovascular:     Rate and Rhythm: Normal rate and regular rhythm.     Heart sounds: Normal heart sounds.  Pulmonary:     Effort: Pulmonary effort is normal. No tachypnea, accessory muscle usage or respiratory distress.     Breath sounds: No decreased breath sounds, wheezing, rhonchi or rales.     Comments: Lungs actually sound pristine and better than I have heard them in a long time.  Chest:     Chest wall: No tenderness.  Musculoskeletal:     Comments: Back pain present, but she is able to move around fine during the visit.  Lymphadenopathy:     Cervical: No cervical adenopathy.  Skin:    Coloration: Skin is not pale.     Findings: No abrasion, erythema, petechiae or rash. Rash is not papular, urticarial or vesicular.  Neurological:     Mental Status: She is alert.  Psychiatric:        Behavior: Behavior is cooperative.      Diagnostic studies: none     Salvatore Marvel, MD  [facility]

## 2022-09-06 ENCOUNTER — Telehealth: Payer: Self-pay

## 2022-09-06 ENCOUNTER — Encounter: Payer: Self-pay | Admitting: Allergy & Immunology

## 2022-09-06 NOTE — Telephone Encounter (Signed)
PA request received via CMM through Lidocaine 5% patches  PA has been submitted and APPROVED from 09/06/2022-09/16/2023    Key: [id number]

## 2022-09-07 ENCOUNTER — Other Ambulatory Visit: Payer: Self-pay

## 2022-09-07 ENCOUNTER — Emergency Department (HOSPITAL_COMMUNITY)
Admission: EM | Admit: 2022-09-07 | Discharge: 2022-09-08 | Disposition: A | Discharge status: Home / Self Care | Payer: Medicare Other | Attending: Emergency Medicine | Admitting: Emergency Medicine

## 2022-09-07 ENCOUNTER — Encounter (HOSPITAL_COMMUNITY): Payer: Self-pay

## 2022-09-07 ENCOUNTER — Emergency Department (HOSPITAL_COMMUNITY): Payer: Medicare Other

## 2022-09-07 DIAGNOSIS — K449 Diaphragmatic hernia without obstruction or gangrene: Secondary | ICD-10-CM | POA: Diagnosis not present

## 2022-09-07 DIAGNOSIS — M47816 Spondylosis without myelopathy or radiculopathy, lumbar region: Secondary | ICD-10-CM | POA: Diagnosis not present

## 2022-09-07 DIAGNOSIS — I700 Atherosclerosis of aorta: Secondary | ICD-10-CM | POA: Insufficient documentation

## 2022-09-07 DIAGNOSIS — W182XXA Fall in (into) shower or empty bathtub, initial encounter: Secondary | ICD-10-CM | POA: Diagnosis not present

## 2022-09-07 DIAGNOSIS — M545 Low back pain, unspecified: Secondary | ICD-10-CM

## 2022-09-07 DIAGNOSIS — W19XXXA Unspecified fall, initial encounter: Secondary | ICD-10-CM

## 2022-09-07 DIAGNOSIS — R109 Unspecified abdominal pain: Secondary | ICD-10-CM | POA: Diagnosis not present

## 2022-09-07 DIAGNOSIS — R296 Repeated falls: Secondary | ICD-10-CM | POA: Diagnosis not present

## 2022-09-07 DIAGNOSIS — M546 Pain in thoracic spine: Secondary | ICD-10-CM | POA: Diagnosis not present

## 2022-09-07 DIAGNOSIS — M4316 Spondylolisthesis, lumbar region: Secondary | ICD-10-CM | POA: Diagnosis not present

## 2022-09-07 DIAGNOSIS — M549 Dorsalgia, unspecified: Secondary | ICD-10-CM | POA: Diagnosis present

## 2022-09-07 DIAGNOSIS — M533 Sacrococcygeal disorders, not elsewhere classified: Secondary | ICD-10-CM | POA: Diagnosis not present

## 2022-09-07 LAB — URINALYSIS, ROUTINE W REFLEX MICROSCOPIC
Bacteria, UA: NONE SEEN
Bilirubin Urine: NEGATIVE
Glucose, UA: NEGATIVE mg/dL
Ketones, ur: NEGATIVE mg/dL
Leukocytes,Ua: NEGATIVE
Nitrite: NEGATIVE
Protein, ur: NEGATIVE mg/dL
Specific Gravity, Urine: 1.015 (ref 1.005–1.030)
pH: 6 (ref 5.0–8.0)

## 2022-09-07 LAB — BASIC METABOLIC PANEL
Anion gap: 7 (ref 5–15)
BUN: 19 mg/dL (ref 8–23)
CO2: 23 mmol/L (ref 22–32)
Calcium: 9.1 mg/dL (ref 8.9–10.3)
Chloride: 108 mmol/L (ref 98–111)
Creatinine, Ser: 0.98 mg/dL (ref 0.44–1.00)
GFR, Estimated: >60 mL/min (ref >60)
Glucose, Bld: 93 mg/dL (ref 70–99)
Potassium: 4.2 mmol/L (ref 3.5–5.1)
Sodium: 138 mmol/L (ref 135–145)

## 2022-09-07 LAB — CBC
HCT: 39.5 % (ref 36.0–46.0)
Hemoglobin: 13.4 g/dL (ref 12.0–15.0)
MCH: 31.4 pg (ref 26.0–34.0)
MCHC: 33.9 g/dL (ref 30.0–36.0)
MCV: 92.5 fL (ref 80.0–100.0)
Platelets: 240 10*3/uL (ref 150–400)
RBC: 4.27 MIL/uL (ref 3.87–5.11)
RDW: 13.2 % (ref 11.5–15.5)
WBC: 9.3 10*3/uL (ref 4.0–10.5)
nRBC: 0 % (ref 0.0–0.2)

## 2022-09-07 LAB — CBG MONITORING, ED: Glucose-Capillary: 109 mg/dL — ABNORMAL HIGH (ref 70–99)

## 2022-09-07 MED ORDER — HYDROCODONE-ACETAMINOPHEN 5-325 MG PO TABS
1.0000 | ORAL_TABLET | Freq: Once | ORAL | Status: DC
  Filled 2022-09-07: qty 1

## 2022-09-07 MED ORDER — TRAMADOL HCL 50 MG PO TABS
50.0000 mg | ORAL_TABLET | Freq: Once | ORAL | Status: AC
  Administered 2022-09-07: 50 mg via ORAL
  Filled 2022-09-07: qty 1

## 2022-09-07 NOTE — ED Provider Triage Note (Signed)
Emergency Medicine Provider Triage Evaluation Note  Lori [name] , a 66 y.o. female  was evaluated in triage.  Pt complains of multiple falls.  Reports that her left knee repeatedly gives out on her.  The most recent fall she slid into the bathtub and hurt her buttocks and lower back.  No fevers, chills, leg weakness, saddle anesthesia or bowel/bladder dysfunction Review of Systems  Positive:  Negative:   Physical Exam  BP (!) 142/67 (BP Location: Left Arm)   Pulse 77   Temp 98.1 F (36.7 C) (Oral)   Resp 16   Ht '5\' 4"'$  (1.626 m)   Wt 53.1 kg   SpO2 98%   BMI 20.08 kg/m  Gen:   Awake, no distress   Resp:  Normal effort  MSK:   Moves extremitie without difficulty  Other:  Ambulatory in the room and reports that any seating position makes her pain worse.  Medical Decision Making  Medically screening exam initiated at 8:08 PM.  Appropriate orders placed.  MIKAL WISMAN was informed that the remainder of the evaluation will be completed by another provider, this initial triage assessment does not replace that evaluation, and the importance of remaining in the ED until their evaluation is complete.     Rhae Hammock, PA-C 09/07/22 2009

## 2022-09-07 NOTE — ED Provider Triage Note (Signed)
Emergency Medicine Provider Triage Evaluation Note I planned to discharge the patient from triage with her sacral fracture.  When I went in the room she said "I just do not know why I keep falling."  She says that she often gets dizzy and occasionally has syncopal episodes.  Labs and EKG now ordered, patient inappropriate for discharge from triage  Declined Vicodin but says that she will take tramadol   Rhae Hammock, PA-C 09/07/22 [id number]

## 2022-09-07 NOTE — ED Triage Notes (Signed)
Multiple falls this past Saturday. Pt says her legs just give out.   Bruises on left lower extremity. Also admits to lower back pain preventing her from sitting still.   Ambulatory.

## 2022-09-08 ENCOUNTER — Emergency Department (HOSPITAL_COMMUNITY): Payer: Medicare Other

## 2022-09-08 DIAGNOSIS — M4316 Spondylolisthesis, lumbar region: Secondary | ICD-10-CM | POA: Diagnosis not present

## 2022-09-08 DIAGNOSIS — R109 Unspecified abdominal pain: Secondary | ICD-10-CM | POA: Diagnosis not present

## 2022-09-08 DIAGNOSIS — M545 Low back pain, unspecified: Secondary | ICD-10-CM | POA: Diagnosis not present

## 2022-09-08 DIAGNOSIS — I700 Atherosclerosis of aorta: Secondary | ICD-10-CM | POA: Diagnosis not present

## 2022-09-08 DIAGNOSIS — M47816 Spondylosis without myelopathy or radiculopathy, lumbar region: Secondary | ICD-10-CM | POA: Diagnosis not present

## 2022-09-08 DIAGNOSIS — M546 Pain in thoracic spine: Secondary | ICD-10-CM | POA: Diagnosis not present

## 2022-09-08 MED ORDER — DICLOFENAC SODIUM 1 % EX GEL
2.0000 g | Freq: Four times a day (QID) | CUTANEOUS | Status: DC
  Filled 2022-09-08: qty 100

## 2022-09-08 MED ORDER — OXYCODONE-ACETAMINOPHEN 5-325 MG PO TABS
1.0000 | ORAL_TABLET | Freq: Once | ORAL | Status: AC
  Administered 2022-09-08: 1 via ORAL
  Filled 2022-09-08: qty 1

## 2022-09-08 MED ORDER — TIZANIDINE HCL 4 MG PO TABS
2.0000 mg | ORAL_TABLET | Freq: Once | ORAL | Status: AC
  Administered 2022-09-08: 2 mg via ORAL
  Filled 2022-09-08: qty 1

## 2022-09-08 MED ORDER — TIZANIDINE HCL 2 MG PO TABS: 2.0000 mg | ORAL_TABLET | Freq: Three times a day (TID) | ORAL | 0 refills | Status: DC | PRN

## 2022-09-08 MED ORDER — DIPHENHYDRAMINE HCL 25 MG PO CAPS
25.0000 mg | ORAL_CAPSULE | Freq: Once | ORAL | Status: AC
  Administered 2022-09-08: 25 mg via ORAL
  Filled 2022-09-08: qty 1

## 2022-09-08 NOTE — ED Notes (Signed)
Pt ambulated down hall successful pt c/o of staggering when walking long distances

## 2022-09-08 NOTE — ED Provider Notes (Signed)
[facility] DEPT Provider Note   CSN: [id number] Arrival date & time: 09/07/22  1839     History  Chief Complaint  Patient presents with   Lori [name] is a 66 y.o. female.  The history is provided by the patient and medical records.  Fall  Lori [name] is a 66 y.o. female who presents to the Emergency Department complaining of back pain.  She presents to the emergency department for evaluation of back pain.  Last Saturday she had a fall when she was in the living room.  She states that her legs went out she got dizzy and struck her bottom followed by her back.  Later the same day she slipped in the bathtub and fell, hitting her back.  No head injury or loss of consciousness.  She presents to the emergency department today due to worsening pain across her low/mid back spreading to her sacral region.  No associated fever, chest pain, shortness of breath, abdominal pain, nausea, vomiting, dysuria, urinary incontinence, constipation, bowel incontinence.  She reports that she has experienced lower extremity weakness for several years and at times her legs go out.   Hx/o copd     Home Medications Prior to Admission medications   Medication Sig Start Date End Date Taking? Authorizing Provider  tiZANidine (ZANAFLEX) 2 MG tablet Take 1 tablet (2 mg total) by mouth every 8 (eight) hours as needed for muscle spasms. 09/08/22  Yes Quintella Reichert, MD  AIMOVIG 140 MG/ML SOAJ Inject 140 mg into the skin every 30 (thirty) days. 01/09/21   [provider]  albuterol (PROAIR HFA) 108 (90 Base) MCG/ACT inhaler Inhale 2 puffs into the lungs every 6 (six) hours as needed for wheezing or shortness of breath. 05/25/22   Allwardt, Randa Evens, PA-C  ALPRAZolam Duanne Moron) 1 MG tablet TAKE 1 TABLET BY MOUTH AT BEDTIME AS NEEDED FOR ANXIETY 07/08/22   Biagio Borg, MD  amoxicillin-clavulanate (AUGMENTIN) 875-125 MG tablet Take 1 tablet by mouth every 12  (twelve) hours. Patient not taking: Reported on 09/05/2022 07/02/22   Roemhildt, Lorin T, PA-C  Budeson-Glycopyrrol-Formoterol (BREZTRI AEROSPHERE) 160-9-4.8 MCG/ACT AERO Inhale 2 puffs once a day with spacer to help prevent cough and wheeze. Patient not taking: Reported on 09/05/2022 09/27/21   Althea Charon, FNP  cefdinir (OMNICEF) 300 MG capsule Take 1 capsule (300 mg total) by mouth 2 (two) times daily. Patient not taking: Reported on 09/05/2022 06/07/22   Biagio Borg, MD  dexlansoprazole (DEXILANT) 60 MG capsule Take 1 capsule (60 mg total) by mouth daily. 06/25/22   Biagio Borg, MD  diphenoxylate-atropine (LOMOTIL) 2.5-0.025 MG tablet Take 1 tablet by mouth 4 (four) times daily as needed for diarrhea or loose stools. Patient not taking: Reported on 09/05/2022 06/11/22   Biagio Borg, MD  EPINEPHrine 0.3 mg/0.3 mL IJ SOAJ injection INJECT AS DIRECTED FOR SEVERE ALLERGIC REACTION 09/27/21   Althea Charon, FNP  FASENRA 30 MG/ML SOSY INJECT '30MG'$  SUBCUTANEOUSLY  EVERY 8 WEEKS 09/04/21   Valentina Shaggy, MD  fluticasone [facility]) 50 MCG/ACT nasal spray Place 2 sprays into both nostrils daily. Patient not taking: Reported on 09/05/2022 05/25/22   Allwardt, Randa Evens, PA-C  gabapentin (NEURONTIN) 300 MG capsule Take 300 mg by mouth in the morning. 07/13/20   [provider]  gabapentin (NEURONTIN) 600 MG tablet Take by mouth. 04/30/22   [provider]  Hyoscyamine Sulfate SL (LEVSIN/SL) 0.125 MG SUBL Place 0.125 mg  under the tongue 3 (three) times daily as needed. Would begin by taking once daily in AM.  If episode of pain/discomfort arises, then use another for up to 3 tablets a day total Patient not taking: Reported on 09/05/2022 04/04/22   Mansouraty, Telford Nab., MD  lidocaine (LIDODERM) 5 % Place 1 patch onto the skin daily. Remove & Discard patch within 12 hours or as directed by MD 09/05/22   Valentina Shaggy, MD  meclizine (ANTIVERT) 12.5 MG tablet Take 12.5 mg  by mouth 3 (three) times daily as needed for dizziness. Patient not taking: Reported on 09/05/2022    [provider]  meclizine (ANTIVERT) 25 MG tablet TAKE 1 TABLET BY MOUTH THREE TIMES DAILY AS NEEDED FOR DIZZINESS Patient not taking: Reported on 09/05/2022 09/04/22   Biagio Borg, MD  methocarbamol (ROBAXIN) 500 MG tablet Take 1 tablet (500 mg total) by mouth 4 (four) times daily. Patient not taking: Reported on 09/05/2022 05/03/22   Biagio Borg, MD  metoCLOPramide (REGLAN) 5 MG tablet Take 1 tablet 30 minutes before each bowel prep Patient not taking: Reported on 09/05/2022 08/14/22   Doran Stabler, MD  montelukast (SINGULAIR) 10 MG tablet Take 1 tablet (10 mg total) by mouth at bedtime. 06/25/22   Biagio Borg, MD  naloxone [facility]) nasal spray 4 mg/0.1 mL One spray in both nares daily Patient not taking: Reported on 09/05/2022 05/03/22   [provider]  nitroGLYCERIN (NITROSTAT) 0.4 MG SL tablet PLACE 1 TABLET UNDER THE TONGUE EVERY 5 MINUTES AS NEEDED FOR CHEST PAIN 07/16/22   Leonie Man, MD  ondansetron (ZOFRAN) 4 MG tablet Take 1 tablet (4 mg total) by mouth every 6 (six) hours as needed for nausea or vomiting. Patient not taking: Reported on 09/05/2022 05/25/22   Allwardt, Randa Evens, PA-C  predniSONE (DELTASONE) 10 MG tablet 3 tabs by mouth per day for 3 days,2tabs per day for 3 days,1tab per day for 3 days 06/07/22   Biagio Borg, MD  promethazine (PHENERGAN) 25 MG tablet Take by mouth. 01/09/22   [provider]  promethazine-dextromethorphan (PROMETHAZINE-DM) 6.25-15 MG/5ML syrup Take 5 mLs by mouth 4 (four) times daily as needed for cough. 06/17/22   Biagio Borg, MD  sucralfate (CARAFATE) 1 g tablet Take 1 tablet (1 g total) by mouth 4 (four) times daily. Patient not taking: Reported on 09/05/2022 06/25/22   Biagio Borg, MD  traMADol (ULTRAM) 50 MG tablet Take 1 tablet (50 mg total) by mouth every 6 (six) hours as needed. 05/03/22   Biagio Borg, MD  valACYclovir (VALTREX) 1000 MG tablet Take 1 tablet by mouth twice daily Patient taking differently: Take 1,000 mg by mouth in the morning. 07/30/21   Biagio Borg, MD      Allergies    Advil [ibuprofen], Bee venom, Codeine, Clonidine derivatives, Statins, Latex, Levaquin [levofloxacin], and Propoxyphene n-acetaminophen    Review of Systems   Review of Systems  All other systems reviewed and are negative.   Physical Exam Updated Vital Signs BP 130/65   Pulse 63   Temp (!) 97.4 F (36.3 C) (Oral)   Resp 17   Ht '5\' 4"'$  (1.626 m)   Wt 53.1 kg   SpO2 98%   BMI 20.08 kg/m  Physical Exam Vitals and nursing note reviewed.  Constitutional:      Appearance: She is well-developed.  HENT:     Head: Normocephalic and atraumatic.  Cardiovascular:  Rate and Rhythm: Normal rate and regular rhythm.     Heart sounds: No murmur heard. Pulmonary:     Effort: Pulmonary effort is normal. No respiratory distress.     Breath sounds: Normal breath sounds.  Abdominal:     Palpations: Abdomen is soft.     Tenderness: There is no abdominal tenderness. There is no guarding or rebound.  Musculoskeletal:        General: No swelling.     Comments: TTP over mid and lower lumbar spine.    Skin:    General: Skin is warm and dry.  Neurological:     Mental Status: She is alert and oriented to person, place, and time.     Comments: 5/5 strength in all four extremities with sensation to light touch intact in all four extremities.   Psychiatric:        Behavior: Behavior normal.     ED Results / Procedures / Treatments   Labs (all labs ordered are listed, but only abnormal results are displayed) Labs Reviewed  URINALYSIS, ROUTINE W REFLEX MICROSCOPIC - Abnormal; Notable for the following components:      Result Value   Color, Urine STRAW (*)    Hgb urine dipstick SMALL (*)    All other components within normal limits  CBG MONITORING, ED - Abnormal; Notable for the following  components:   Glucose-Capillary 109 (*)    All other components within normal limits  BASIC METABOLIC PANEL  CBC    EKG EKG Interpretation  Date/Time:  Saturday September 07 2022 22:20:49 EST Ventricular Rate:  62 PR Interval:  118 QRS Duration: 115 QT Interval:  414 QTC Calculation: 424 R Axis:   46 Text Interpretation: Sinus rhythm Borderline short PR interval Nonspecific intraventricular conduction delay Borderline low voltage, extremity leads Abnormal inferior Q waves No significant change since last tracing Confirmed by Deno Etienne [phone]) on 09/07/2022 10:25:36 PM  Radiology DG Thoracic Spine 2 View  Result Date: 09/08/2022 CLINICAL DATA:  Recent fall with severe pain EXAM: THORACIC SPINE 2 VIEWS COMPARISON:  Lumbar CT from earlier the same day FINDINGS: There is no evidence of thoracic spine fracture. Alignment is normal. No other significant bone abnormalities are identified. IMPRESSION: Negative. Electronically Signed   By: Jorje Guild M.D.   On: 09/08/2022 05:25   CT L-SPINE NO CHARGE  Result Date: 09/08/2022 CLINICAL DATA:  Initial evaluation for acute trauma, falls. EXAM: CT LUMBAR SPINE WITHOUT CONTRAST TECHNIQUE: Multidetector CT imaging of the lumbar spine was performed without intravenous contrast administration. Multiplanar CT image reconstructions were also generated. RADIATION DOSE REDUCTION: This exam was performed according to the departmental dose-optimization program which includes automated exposure control, adjustment of the mA and/or kV according to patient size and/or use of iterative reconstruction technique. COMPARISON:  Prior radiographs from 09/07/2022. FINDINGS: Segmentation: Standard. Lowest well-formed disc space labeled the L5-S1 level. Alignment: Trace facet mediated anterolisthesis of L4 on L5. Alignment otherwise normal with preservation of the normal lumbar lordosis. Vertebrae: Vertebral body height maintained without acute or chronic fracture.  Previously identified distal sacral fracture not included on this exam. The visualized portions of the sacrum and pelvis are intact. SI joints symmetric and within normal limits. No worrisome osseous lesions. Paraspinal and other soft tissues: Paraspinous soft tissues demonstrate no acute finding. Moderate aorto bi-iliac atherosclerotic disease. Disc levels: L1-2: Negative interspace. Mild bilateral facet hypertrophy. No stenosis. L2-3:  Minimal disc bulge.  Mild facet hypertrophy.  No stenosis. L3-4: Mild disc bulge. Mild  facet hypertrophy. No significant canal or foraminal stenosis. L4-5: Trace anterolisthesis with mild disc bulge. Moderate right worse than left facet hypertrophy. No significant spinal stenosis. Foramina remain patent. L5-S1: Minimal disc bulge. Mild facet hypertrophy. No canal or foraminal stenosis. IMPRESSION: 1. No acute osseous injury within the lumbar spine. Previously identified distal sacral fracture not included on this exam. 2. Mild for age lumbar spondylosis and facet arthrosis without significant stenosis. Aortic Atherosclerosis (ICD10-I70.0). Electronically Signed   By: Jeannine Boga M.D.   On: 09/08/2022 03:13   CT ABDOMEN PELVIS WO CONTRAST  Result Date: 09/08/2022 CLINICAL DATA:  Back and pelvic pain after fall. Had multiple falls a few days ago EXAM: CT ABDOMEN AND PELVIS WITHOUT CONTRAST TECHNIQUE: Multidetector CT imaging of the abdomen and pelvis was performed following the standard protocol without IV contrast. RADIATION DOSE REDUCTION: This exam was performed according to the departmental dose-optimization program which includes automated exposure control, adjustment of the mA and/or kV according to patient size and/or use of iterative reconstruction technique. COMPARISON:  Abdominal MRI 01/24/2022 and CT abdomen and pelvis 01/17/2022 FINDINGS: Lower chest: No acute abnormality. 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 Hepatobiliary: No  suspicious hepatic lesion. Cholecystectomy. Slightly decreased extrahepatic biliary dilation. The common bile duct remains dilated likely due to reservoir effect post cholecystectomy. Pancreas: Unremarkable. No pancreatic ductal dilatation or surrounding inflammatory changes. Spleen: Normal in size without focal abnormality. Adrenals/Urinary Tract: Adrenal glands are unremarkable. Kidneys are normal, without renal calculi, focal lesion, or hydronephrosis. Bladder is unremarkable. Stomach/Bowel: Normal caliber large and small bowel. Unremarkable stomach. No bowel wall thickening. Vascular/Lymphatic: Aortic atherosclerosis. No enlarged abdominal or pelvic lymph nodes. Reproductive: Hysterectomy. Other: No free intraperitoneal fluid or air. Musculoskeletal: No acute fracture. IMPRESSION: No acute abnormality in the abdomen or pelvis. 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 Electronically Signed   By: Placido Sou M.D.   On: 09/08/2022 03:09   DG Lumbar Spine Complete  Result Date: 09/07/2022 CLINICAL DATA:  Multiple falls, lower back pain EXAM: LUMBAR SPINE - COMPLETE 4+ VIEW; SACRUM AND COCCYX - 2+ VIEW COMPARISON:  05/03/2022 FINDINGS: Lumbar spine: Frontal, bilateral oblique, lateral views are obtained on 5 images. There are 5 non-rib-bearing lumbar type vertebral bodies in stable anatomic alignment. No acute fractures. Stable facet hypertrophic changes at L4-5 and L5-S1. Sacroiliac joints are unremarkable. Sacrum/coccyx: Frontal and lateral views are obtained on 3 images. There is a subtle lucency within the distal sacrum just proximal to the sacrococcygeal junction, only seen on lateral projection, consistent with nondisplaced fracture. No other acute bony abnormalities. Visualized bony pelvis is unremarkable. IMPRESSION: 1. Nondisplaced distal sacral fracture just proximal to the sacrococcygeal junction. 2. Stable lower lumbar spondylosis. Electronically Signed   By:  Randa Ngo M.D.   On: 09/07/2022 20:35   DG Sacrum/Coccyx  Result Date: 09/07/2022 CLINICAL DATA:  Multiple falls, lower back pain EXAM: LUMBAR SPINE - COMPLETE 4+ VIEW; SACRUM AND COCCYX - 2+ VIEW COMPARISON:  05/03/2022 FINDINGS: Lumbar spine: Frontal, bilateral oblique, lateral views are obtained on 5 images. There are 5 non-rib-bearing lumbar type vertebral bodies in stable anatomic alignment. No acute fractures. Stable facet hypertrophic changes at L4-5 and L5-S1. Sacroiliac joints are unremarkable. Sacrum/coccyx: Frontal and lateral views are obtained on 3 images. There is a subtle lucency within the distal sacrum just proximal to the sacrococcygeal junction, only seen on lateral projection, consistent with nondisplaced fracture. No other acute bony abnormalities. Visualized bony pelvis is unremarkable. IMPRESSION: 1.  Nondisplaced distal sacral fracture just proximal to the sacrococcygeal junction. 2. Stable lower lumbar spondylosis. Electronically Signed   By: Randa Ngo M.D.   On: 09/07/2022 20:35    Procedures Procedures    Medications Ordered in ED Medications  diclofenac Sodium (VOLTAREN) 1 % topical gel 2 g (has no administration in time range)  traMADol (ULTRAM) tablet 50 mg (50 mg Oral Given 09/07/22 2136)  oxyCODONE-acetaminophen (PERCOCET/ROXICET) 5-325 MG per tablet 1 tablet (1 tablet Oral Given 09/08/22 0213)  diphenhydrAMINE (BENADRYL) capsule 25 mg (25 mg Oral Given 09/08/22 0355)  tiZANidine (ZANAFLEX) tablet 2 mg (2 mg Oral Given 09/08/22 6384)    ED Course/ Medical Decision Making/ A&P                           Medical Decision Making Amount and/or Complexity of Data Reviewed Radiology: ordered.  Risk Prescription drug management.   Patient with history of COPD here for evaluation of back pain following a fall that occurred 1 week ago.  She is neurologically and vascularly intact on examination but she does have pain over her lumbar spine.  Plain films  are concerning for possible sacral fracture.  CT scan was obtained, which does not demonstrate any acute fracture or subluxation.  She is on tramadol at home.  Discussed OTC topical analgesics for her symptoms.  Will add tizanidine.  Discussed outpatient follow-up for her back pain secondary to fall.  Discussed return precautions.        Final Clinical Impression(s) / ED Diagnoses Final diagnoses:  Fall, initial encounter  Acute midline low back pain without sciatica    Rx / DC Orders ED Discharge Orders          Ordered    tiZANidine (ZANAFLEX) 2 MG tablet  Every 8 hours PRN        09/08/22 5364              Quintella Reichert, MD 09/08/22 0730

## 2022-09-08 NOTE — Discharge Instructions (Signed)
You may use voltaren gel or lidocaine gel, available over the counter according to label instruction as needed for pain.

## 2022-09-11 ENCOUNTER — Emergency Department (HOSPITAL_COMMUNITY): Payer: Medicare Other

## 2022-09-11 ENCOUNTER — Other Ambulatory Visit: Payer: Self-pay

## 2022-09-11 ENCOUNTER — Emergency Department (HOSPITAL_COMMUNITY)
Admission: EM | Admit: 2022-09-11 | Discharge: 2022-09-12 | Disposition: A | Discharge status: Home / Self Care | Payer: Medicare Other | Attending: Emergency Medicine | Admitting: Emergency Medicine

## 2022-09-11 DIAGNOSIS — J449 Chronic obstructive pulmonary disease, unspecified: Secondary | ICD-10-CM | POA: Insufficient documentation

## 2022-09-11 DIAGNOSIS — W19XXXA Unspecified fall, initial encounter: Secondary | ICD-10-CM | POA: Diagnosis not present

## 2022-09-11 DIAGNOSIS — R1010 Upper abdominal pain, unspecified: Secondary | ICD-10-CM | POA: Diagnosis not present

## 2022-09-11 DIAGNOSIS — F17210 Nicotine dependence, cigarettes, uncomplicated: Secondary | ICD-10-CM | POA: Diagnosis not present

## 2022-09-11 DIAGNOSIS — Z743 Need for continuous supervision: Secondary | ICD-10-CM | POA: Diagnosis not present

## 2022-09-11 DIAGNOSIS — K449 Diaphragmatic hernia without obstruction or gangrene: Secondary | ICD-10-CM | POA: Diagnosis not present

## 2022-09-11 DIAGNOSIS — R0602 Shortness of breath: Secondary | ICD-10-CM | POA: Diagnosis not present

## 2022-09-11 DIAGNOSIS — R109 Unspecified abdominal pain: Secondary | ICD-10-CM | POA: Diagnosis not present

## 2022-09-11 DIAGNOSIS — Z91040 Latex allergy status: Secondary | ICD-10-CM | POA: Diagnosis not present

## 2022-09-11 DIAGNOSIS — R1013 Epigastric pain: Secondary | ICD-10-CM | POA: Diagnosis not present

## 2022-09-11 LAB — CBC WITH DIFFERENTIAL/PLATELET
Abs Immature Granulocytes: 0.03 10*3/uL (ref 0.00–0.07)
Basophils Absolute: 0 10*3/uL (ref 0.0–0.1)
Basophils Relative: 0 %
Eosinophils Absolute: 0 10*3/uL (ref 0.0–0.5)
Eosinophils Relative: 0 %
HCT: 40.1 % (ref 36.0–46.0)
Hemoglobin: 13.7 g/dL (ref 12.0–15.0)
Immature Granulocytes: 0 %
Lymphocytes Relative: 25 %
Lymphs Abs: 2.7 10*3/uL (ref 0.7–4.0)
MCH: 31.4 pg (ref 26.0–34.0)
MCHC: 34.2 g/dL (ref 30.0–36.0)
MCV: 91.8 fL (ref 80.0–100.0)
Monocytes Absolute: 0.6 10*3/uL (ref 0.1–1.0)
Monocytes Relative: 6 %
Neutro Abs: 7.2 10*3/uL (ref 1.7–7.7)
Neutrophils Relative %: 69 %
Platelets: 281 10*3/uL (ref 150–400)
RBC: 4.37 MIL/uL (ref 3.87–5.11)
RDW: 13.2 % (ref 11.5–15.5)
WBC: 10.6 10*3/uL — ABNORMAL HIGH (ref 4.0–10.5)
nRBC: 0 % (ref 0.0–0.2)

## 2022-09-11 LAB — COMPREHENSIVE METABOLIC PANEL
ALT: 13 U/L (ref 0–44)
AST: 15 U/L (ref 15–41)
Albumin: 4.7 g/dL (ref 3.5–5.0)
Alkaline Phosphatase: 72 U/L (ref 38–126)
Anion gap: 8 (ref 5–15)
BUN: 17 mg/dL (ref 8–23)
CO2: 21 mmol/L — ABNORMAL LOW (ref 22–32)
Calcium: 9.3 mg/dL (ref 8.9–10.3)
Chloride: 108 mmol/L (ref 98–111)
Creatinine, Ser: 0.78 mg/dL (ref 0.44–1.00)
GFR, Estimated: >60 mL/min (ref >60)
Glucose, Bld: 99 mg/dL (ref 70–99)
Potassium: 4.4 mmol/L (ref 3.5–5.1)
Sodium: 137 mmol/L (ref 135–145)
Total Bilirubin: 0.7 mg/dL (ref 0.3–1.2)
Total Protein: 7.3 g/dL (ref 6.5–8.1)

## 2022-09-11 LAB — URINALYSIS, ROUTINE W REFLEX MICROSCOPIC
Bacteria, UA: NONE SEEN
Bilirubin Urine: NEGATIVE
Glucose, UA: NEGATIVE mg/dL
Hgb urine dipstick: NEGATIVE
Ketones, ur: NEGATIVE mg/dL
Nitrite: NEGATIVE
Protein, ur: NEGATIVE mg/dL
Specific Gravity, Urine: 1.011 (ref 1.005–1.030)
pH: 8 (ref 5.0–8.0)

## 2022-09-11 LAB — LIPASE, BLOOD: Lipase: 26 U/L (ref 11–51)

## 2022-09-11 LAB — TROPONIN I (HIGH SENSITIVITY): Troponin I (High Sensitivity): <2 ng/L (ref <18)

## 2022-09-11 MED ORDER — IOHEXOL 300 MG/ML  SOLN
100.0000 mL | Freq: Once | INTRAMUSCULAR | Status: AC | PRN
  Administered 2022-09-11: 100 mL via INTRAVENOUS

## 2022-09-11 NOTE — ED Triage Notes (Signed)
Pt via EMS from home c/o abdominal/hernia pain after sustaining a fall last weekend. She was evaluated and discharged at the time with no pain but believes she may have injured herself in the fall.   BP 128/90 HR 72 O2 98% RA  A/O x 4. No other symptoms reported.

## 2022-09-11 NOTE — ED Provider Triage Note (Signed)
Emergency Medicine Provider Triage Evaluation Note  Lori [name] , a 66 y.o. female  was evaluated in triage.  Pt complains of epigastric abdominal pain that is nonradiating.  Denies nausea, vomiting, lightheadedness, or palpitations.  She does endorse some shortness of breath.  Denies dysuria, flank pain.  Symptoms started around 3:15 PM today  Review of Systems  Positive: As above Negative: As above  Physical Exam  BP 130/63   Pulse 71   Temp 97.8 F (36.6 C) (Oral)   Resp 18   SpO2 97%  Gen:   Awake, no distress   Resp:  Normal effort  MSK:   Moves extremities without difficulty  Other:    Medical Decision Making  Medically screening exam initiated at 4:53 PM.  Appropriate orders placed.  Lori [name] was informed that the remainder of the evaluation will be completed by another provider, this initial triage assessment does not replace that evaluation, and the importance of remaining in the ED until their evaluation is complete.    [name], PA-C 09/11/22 1654

## 2022-09-12 ENCOUNTER — Other Ambulatory Visit: Payer: Self-pay | Admitting: *Deleted

## 2022-09-12 ENCOUNTER — Ambulatory Visit (INDEPENDENT_AMBULATORY_CARE_PROVIDER_SITE_OTHER): Payer: Medicare Other

## 2022-09-12 DIAGNOSIS — J309 Allergic rhinitis, unspecified: Secondary | ICD-10-CM

## 2022-09-12 MED ORDER — FASENRA 30 MG/ML ~~LOC~~ SOSY: PREFILLED_SYRINGE | SUBCUTANEOUS | 6 refills | Status: DC

## 2022-09-12 MED ORDER — FENTANYL CITRATE PF 50 MCG/ML IJ SOSY
100.0000 ug | PREFILLED_SYRINGE | Freq: Once | INTRAMUSCULAR | Status: DC
  Filled 2022-09-12: qty 2

## 2022-09-12 MED ORDER — HYDROCODONE-ACETAMINOPHEN 5-325 MG PO TABS: 1.0000 | ORAL_TABLET | Freq: Four times a day (QID) | ORAL | 0 refills | Status: DC | PRN

## 2022-09-12 NOTE — ED Provider Notes (Signed)
[facility] DEPT Provider Note: Georgena Spurling, MD, FACEP  CSN: [id number] MRN: [id number] ARRIVAL: 09/11/22 at [place]  Abdominal Pain   HISTORY OF PRESENT ILLNESS  09/12/22 2:33 AM Lori [name] is a 66 y.o. female fell on 09/08/2022 and was seen in the ED.  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  She was not having abdominal pain at the time.  She returns with cramping epigastric pain that she rates as a 10 out of 10.  Began yesterday about 3:15 PM and is persisting.  She has had some nausea with this but no vomiting.  Past Medical History:  Diagnosis Date   Allergic rhinitis    Anemia    hgb 13.9 on 11/22/21   Ankle fracture 05/2016   Anxiety    Arthritis    Asthma    Barrett esophagus 2007   Chest pain    Hx, no current problems as of 11/22/21.  Recurrent episodes.  Has had 3 negative nuclear stress test, and a completely normal coronary CT angiogram   COPD (chronic obstructive pulmonary disease) with chronic bronchitis    & Emphysema   Depression    Duodenitis without mention of hemorrhage 2007   Esophageal reflux 2007   Esophageal stricture    Full dentures    patient does not wear dentures as of 11/22/21   H/O hiatal hernia    Hiatal hernia 2006,2007   HSV infection    Hyperlipidemia    rx crestor but pt not taking   Multiple fractures    from falls, fx rt. elbow, fx left wrist, bilateral ankles   Pneumonia 10/2016   Restrictive lung disease 06/30/2017   Ulcer    no current problems as of 11/22/21    Past Surgical History:  Procedure Laterality Date   ABDOMINAL HYSTERECTOMY     BSO   APPENDECTOMY     BIOPSY  04/04/2022   Procedure: BIOPSY;  Surgeon: Irving Copas., MD;  Location: [facility];  Service: Gastroenterology;;   CESAREAN SECTION     x 2   CHOLECYSTECTOMY     CHONDROPLASTY Left 01/06/2015   Procedure: CHONDROPLASTY;   Surgeon: Marybelle Killings, MD;  Location: [facility];  Service: Orthopedics;  Laterality: Left;   COLONOSCOPY     CT CTA CORONARY W/CA SCORE W/CM &/OR WO/CM  12/02/2016   CORONARY CALCIUM SCORE 0.  NO EVIDENCE OF CAD.  Normal-sized PA.  Normal pulmonary venous drainage the left atrium.  No ASD or VSD.   ELBOW FRACTURE SURGERY     right   ESOPHAGOGASTRODUODENOSCOPY (EGD) WITH PROPOFOL N/A 04/04/2022   Procedure: ESOPHAGOGASTRODUODENOSCOPY (EGD) WITH PROPOFOL;  Surgeon: Rush Landmark Telford Nab., MD;  Location: [facility];  Service: Gastroenterology;  Laterality: N/A;   EUS N/A 04/04/2022   Procedure: UPPER ENDOSCOPIC ULTRASOUND (EUS) RADIAL;  Surgeon: Irving Copas., MD;  Location: [facility];  Service: Gastroenterology;  Laterality: N/A;   EYE SURGERY Bilateral    cataracts removed   KNEE ARTHROSCOPY WITH EXCISION PLICA Left 68/11/2120   Procedure: KNEE ARTHROSCOPY WITH EXCISION PLICA;  Surgeon: Marybelle Killings, MD;  Location: [facility];  Service: Orthopedics;  Laterality: Left;   LIGAMENT REPAIR Right 11/28/2021   Procedure: RIGHT THUMB LIGAMENT RECONSTRUCTION AND TENDON INTERPOSITION;  Surgeon: Sherilyn Cooter, MD;  Location: [facility];  Service: Orthopedics;  Laterality: Right;   Lower Extremity  Arterial Dopplers  07/06/2013   RABI 1.0, LABI 1.1.; No evidence of significant vascular atherogenic plaque   NECK SURGERY     NM MYOVIEW LTD  09/2014   a) LOW RISK. Normal EF of 65%, no RWMA. NO ISCHEMIA OR INFARCTION.;; b) 05/02/2009: Normal study.  LOW RISK.  No ischemia or infarction.  EF 74%.  Breast attenuation noted.   TONSILLECTOMY     TRANSTHORACIC ECHOCARDIOGRAM  03/28/2021   a) Normal EF 60 to 65%.  GR 1 DD.  No R WMA.  Normal RV size and function.  Normal RAP.  Normal valves. => NORMAL;; b) 04/23/16/2010: Normal study.  LOW RISK.  No ischemia or infarction.  EF 74%.  Breast attenuation noted.17/2010: ([facility]) EF>55%.  Normal wall motion.  Normal valves.   WRIST  FRACTURE SURGERY     left, has plate    Family History  Problem Relation Age of Onset   Emphysema Father    Dementia Mother    Diabetes Maternal Grandmother    Diabetes Maternal Aunt    Diabetes Other        mat. cousin   Heart disease Brother    Colon cancer Neg Hx    Allergic rhinitis Neg Hx    Angioedema Neg Hx    Asthma Neg Hx    Atopy Neg Hx    Eczema Neg Hx    Immunodeficiency Neg Hx    Urticaria Neg Hx     Social History   Tobacco Use   Smoking status: Every Day    Packs/day: 0.50    Years: 39.00    Total pack years: 19.50    Types: Cigarettes    Start date: 08/06/1977   Smokeless tobacco: Never   Tobacco comments:    Patient has cut back to 2-3 per day as of 11/22/21  Vaping Use   Vaping Use: Never used  Substance Use Topics   Alcohol use: No    Alcohol/week: 0.0 standard drinks of alcohol   Drug use: No    Prior to Admission medications   Medication Sig Start Date End Date Taking? Authorizing Provider  HYDROcodone-acetaminophen (NORCO) 5-325 MG tablet Take 1 tablet by mouth every 6 (six) hours as needed for severe pain. 09/12/22  Yes Eleni Frank, MD  AIMOVIG 140 MG/ML SOAJ Inject 140 mg into the skin every 30 (thirty) days. 01/09/21   [provider]  albuterol (PROAIR HFA) 108 (90 Base) MCG/ACT inhaler Inhale 2 puffs into the lungs every 6 (six) hours as needed for wheezing or shortness of breath. 05/25/22   Allwardt, Randa Evens, PA-C  ALPRAZolam Duanne Moron) 1 MG tablet TAKE 1 TABLET BY MOUTH AT BEDTIME AS NEEDED FOR ANXIETY 07/08/22   Biagio Borg, MD  dexlansoprazole (DEXILANT) 60 MG capsule Take 1 capsule (60 mg total) by mouth daily. 06/25/22   Biagio Borg, MD  EPINEPHrine 0.3 mg/0.3 mL IJ SOAJ injection INJECT AS DIRECTED FOR SEVERE ALLERGIC REACTION 09/27/21   Althea Charon, FNP  [facility] 30 MG/ML SOSY INJECT '30MG'$  SUBCUTANEOUSLY  EVERY 8 WEEKS 09/04/21   Valentina Shaggy, MD  gabapentin (NEURONTIN) 300 MG capsule Take 300 mg by mouth in the  morning. 07/13/20   [provider]  gabapentin (NEURONTIN) 600 MG tablet Take by mouth. 04/30/22   [provider]  lidocaine (LIDODERM) 5 % Place 1 patch onto the skin daily. Remove & Discard patch within 12 hours or as directed by MD 09/05/22   Valentina Shaggy, MD  montelukast (  SINGULAIR) 10 MG tablet Take 1 tablet (10 mg total) by mouth at bedtime. 06/25/22   Biagio Borg, MD  nitroGLYCERIN (NITROSTAT) 0.4 MG SL tablet PLACE 1 TABLET UNDER THE TONGUE EVERY 5 MINUTES AS NEEDED FOR CHEST PAIN 07/16/22   Leonie Man, MD  predniSONE (DELTASONE) 10 MG tablet 3 tabs by mouth per day for 3 days,2tabs per day for 3 days,1tab per day for 3 days 06/07/22   Biagio Borg, MD  promethazine (PHENERGAN) 25 MG tablet Take by mouth. 01/09/22   [provider]  promethazine-dextromethorphan (PROMETHAZINE-DM) 6.25-15 MG/5ML syrup Take 5 mLs by mouth 4 (four) times daily as needed for cough. 06/17/22   Biagio Borg, MD  tiZANidine (ZANAFLEX) 2 MG tablet Take 1 tablet (2 mg total) by mouth every 8 (eight) hours as needed for muscle spasms. 09/08/22   Quintella Reichert, MD  valACYclovir (VALTREX) 1000 MG tablet Take 1 tablet by mouth twice daily Patient taking differently: Take 1,000 mg by mouth in the morning. 07/30/21   Biagio Borg, MD    Allergies Advil [ibuprofen], Bee venom, Codeine, Clonidine derivatives, Statins, Latex, Levaquin [levofloxacin], and Propoxyphene n-acetaminophen   REVIEW OF SYSTEMS  Negative except as noted here or in the History of Present Illness.   PHYSICAL EXAMINATION  Initial Vital Signs Blood pressure 91/76, pulse 65, temperature 98.2 F (36.8 C), temperature source Oral, resp. rate 18, height '5\' 4"'$  (1.626 m), weight 53.1 kg, SpO2 91 %.  Examination General: Well-developed, well-nourished female in no acute distress; appearance consistent with age of record HENT: normocephalic; atraumatic Eyes: Normal appearance Neck: supple Heart: regular  rate and rhythm Lungs: clear to auscultation bilaterally Abdomen: soft; nondistended; epigastric tenderness; bowel sounds present Extremities: No deformity; full range of motion; pulses normal Neurologic: Awake, alert and oriented; motor function intact in all extremities and symmetric; no facial droop Skin: Warm and dry Psychiatric: Normal mood and affect   RESULTS  Summary of this visit's results, reviewed and interpreted by myself:   EKG Interpretation  Date/Time:    Ventricular Rate:    PR Interval:    QRS Duration:   QT Interval:    QTC Calculation:   R Axis:     Text Interpretation:         Laboratory Studies: Results for orders placed or performed during the hospital encounter of 09/11/22 (from the past 24 hour(s))  CBC with Differential/Platelet     Status: Abnormal   Collection Time: 09/11/22  5:56 PM  Result Value Ref Range   WBC 10.6 (H) 4.0 - 10.5 K/uL   RBC 4.37 3.87 - 5.11 MIL/uL   Hemoglobin 13.7 12.0 - 15.0 g/dL   HCT 40.1 36.0 - 46.0 %   MCV 91.8 80.0 - 100.0 fL   MCH 31.4 26.0 - 34.0 pg   MCHC 34.2 30.0 - 36.0 g/dL   RDW 13.2 11.5 - 15.5 %   Platelets 281 150 - 400 K/uL   nRBC 0.0 0.0 - 0.2 %   Neutrophils Relative % 69 %   Neutro Abs 7.2 1.7 - 7.7 K/uL   Lymphocytes Relative 25 %   Lymphs Abs 2.7 0.7 - 4.0 K/uL   Monocytes Relative 6 %   Monocytes Absolute 0.6 0.1 - 1.0 K/uL   Eosinophils Relative 0 %   Eosinophils Absolute 0.0 0.0 - 0.5 K/uL   Basophils Relative 0 %   Basophils Absolute 0.0 0.0 - 0.1 K/uL   Immature Granulocytes 0 %   Abs  Immature Granulocytes 0.03 0.00 - 0.07 K/uL  Comprehensive metabolic panel     Status: Abnormal   Collection Time: 09/11/22  5:56 PM  Result Value Ref Range   Sodium 137 135 - 145 mmol/L   Potassium 4.4 3.5 - 5.1 mmol/L   Chloride 108 98 - 111 mmol/L   CO2 21 (L) 22 - 32 mmol/L   Glucose, Bld 99 70 - 99 mg/dL   BUN 17 8 - 23 mg/dL   Creatinine, Ser 0.78 0.44 - 1.00 mg/dL   Calcium 9.3 8.9 - 10.3  mg/dL   Total Protein 7.3 6.5 - 8.1 g/dL   Albumin 4.7 3.5 - 5.0 g/dL   AST 15 15 - 41 U/L   ALT 13 0 - 44 U/L   Alkaline Phosphatase 72 38 - 126 U/L   Total Bilirubin 0.7 0.3 - 1.2 mg/dL   GFR, Estimated >60 >60 mL/min   Anion gap 8 5 - 15  Lipase, blood     Status: None   Collection Time: 09/11/22  5:56 PM  Result Value Ref Range   Lipase 26 11 - 51 U/L  Troponin I (High Sensitivity)     Status: None   Collection Time: 09/11/22  5:56 PM  Result Value Ref Range   Troponin I (High Sensitivity) <2 <18 ng/L  Urinalysis, Routine w reflex microscopic Urine, Clean Catch     Status: Abnormal   Collection Time: 09/11/22  7:03 PM  Result Value Ref Range   Color, Urine YELLOW YELLOW   APPearance CLEAR CLEAR   Specific Gravity, Urine 1.011 1.005 - 1.030   pH 8.0 5.0 - 8.0   Glucose, UA NEGATIVE NEGATIVE mg/dL   Hgb urine dipstick NEGATIVE NEGATIVE   Bilirubin Urine NEGATIVE NEGATIVE   Ketones, ur NEGATIVE NEGATIVE mg/dL   Protein, ur NEGATIVE NEGATIVE mg/dL   Nitrite NEGATIVE NEGATIVE   Leukocytes,Ua TRACE (A) NEGATIVE   RBC / HPF 0-5 0 - 5 RBC/hpf   WBC, UA 0-5 0 - 5 WBC/hpf   Bacteria, UA NONE SEEN NONE SEEN   Squamous Epithelial / LPF 0-5 0 - 5 /HPF   *Note: Due to a large number of results and/or encounters for the requested time period, some results have not been displayed. A complete set of results can be found in Results Review.   Imaging Studies: CT ABDOMEN PELVIS W CONTRAST  Result Date: 09/11/2022 CLINICAL DATA:  Frequent recent falls, most recently 09/08/2022, continued abdominal pain. EXAM: CT ABDOMEN AND PELVIS WITH CONTRAST TECHNIQUE: Multidetector CT imaging of the abdomen and pelvis was performed using the standard protocol following bolus administration of intravenous contrast. RADIATION DOSE REDUCTION: This exam was performed according to the departmental dose-optimization program which includes automated exposure control, adjustment of the mA and/or kV according to  patient size and/or use of iterative reconstruction technique. CONTRAST:  164m OMNIPAQUE IOHEXOL 300 MG/ML  SOLN COMPARISON:  CT without contrast 09/08/2022, CT with IV and oral contrast 01/18/2023. FINDINGS: Lower chest: Lung bases are mildly emphysematous but clear. Again, a large foramen of Morgagni hernia to the right of the heart displaces the heart to the left and contains the hepatic flexure and proximal transverse colon, slightly larger than 3 days ago and significantly larger than 01/17/2022. There are no inflammatory changes or colonic wall thickening in the hernia sac, no fluid in the hernia. There is a tiny hiatal hernia. The cardiac size is normal. No pericardial effusion. Hepatobiliary: 16 cm in length mildly steatotic liver without mass  enhancement. Post cholecystectomy common bile duct dilatation to 1.5 cm is similar, with no calcified intraductal stones being seen and no significant intrahepatic biliary prominence. Pancreas: No focal abnormality or inflammation. Spleen: No focal abnormality.  No splenomegaly. Adrenals/Urinary Tract: There is no adrenal or renal mass enhancement. Stable 4 mm too small to characterize hypodensity superior pole right kidney, statistically most likely a cyst. No follow-up imaging is recommended. No urinary stone or obstruction is seen and there is symmetric excretion in the delayed phase. There is mild bladder thickening versus underdistention. Stomach/Bowel: Unremarkable stomach and unopacified small bowel. An appendix is not seen. No small or large bowel wall thickening or dilatation. Mild-to-moderate fecal stasis and scattered uncomplicated colonic diverticulosis. Vascular/Lymphatic: There is moderate aortoiliac mixed plaque, less significant visceral branch arterial plaques. No adenopathy is seen. Reproductive: Status post hysterectomy. No adnexal masses. Other: No free air, hemorrhage or fluid. There are small inguinal fat hernias. No incarcerated hernia.  Musculoskeletal: There is osteopenia, mild degenerative change in the spine and slight dextroscoliosis. Chronic postoperative changes of the anterior iliac crests are unaltered. No acute or significant osseous abnormality. No lumbar compression fractures. IMPRESSION: 1. 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 There are no inflammatory changes or fluid in the hernia sac. 2. Constipation and diverticulosis. 3. Cystitis versus bladder nondistention. 4. Aortic atherosclerosis. 5. Emphysema. 6. Chronic postcholecystectomy extrahepatic biliary dilatation. 7. Additional chronic findings. Aortic Atherosclerosis (ICD10-I70.0) and Emphysema (ICD10-J43.9). Electronically Signed   By: Telford Nab M.D.   On: 09/11/2022 20:19   DG Chest 2 View  Result Date: 09/11/2022 CLINICAL DATA:  Shortness of breath EXAM: CHEST - 2 VIEW COMPARISON:  CT abdomen pelvis 09/08/2022 FINDINGS: Soft tissue density at the right lung base corresponds to right diaphragmatic hernia containing bowel seen on prior abdominal CT. Heart is normal size. Mediastinal contours within normal limits. Aortic atherosclerosis. No confluent airspace opacity or effusion. No acute bony abnormality. IMPRESSION: Right diaphragmatic hernia containing bowel. No acute cardiopulmonary disease. Electronically Signed   By: Rolm Baptise M.D.   On: 09/11/2022 17:13    ED COURSE and MDM  Nursing notes, initial and subsequent vitals signs, including pulse oximetry, reviewed and interpreted by myself.  Vitals:   09/11/22 1649 09/11/22 1839 09/12/22 0021 09/12/22 0305  BP: 130/63  91/76 133/65  Pulse: 71  65 64  Resp: '18  18 18  '$ Temp: 97.8 F (36.6 C)  98.2 F (36.8 C)   TempSrc: Oral  Oral   SpO2: 97%  91% 99%  Weight:  53.1 kg    Height:  '5\' 4"'$  (1.626 m)     Medications  fentaNYL (SUBLIMAZE) injection 100 mcg (100 mcg  Intravenous Not Given 09/12/22 0359)  iohexol (OMNIPAQUE) 300 MG/ML solution 100 mL (100 mLs Intravenous Contrast Given 09/11/22 1938)   3:43 AM Discussed with Dr. Ned Card of general surgery.  She does not believe the patient needs emergency surgery as there is no evidence of obstruction or ischemia.  She advises we had the patient follow-up in the office with one of the surgeons familiar with this condition.   PROCEDURES  Procedures   ED DIAGNOSES     ICD-10-CM   1. Diaphragmatic hernia without obstruction and without gangrene  K44.9          Caitland Porchia, MD 09/12/22 [phone]

## 2022-09-13 ENCOUNTER — Telehealth: Payer: Self-pay

## 2022-09-13 NOTE — Telephone Encounter (Signed)
        Patient  visited [facility] on 12/24   Telephone encounter attempt :   1st A HIPAA compliant voice message was left requesting a return call.  Instructed patient to call back    [facility], [facility]  [phone], [place], [place] [address] Phone: [phone] Email: [email or web address]

## 2022-09-17 ENCOUNTER — Telehealth: Payer: Self-pay | Admitting: Internal Medicine

## 2022-09-17 ENCOUNTER — Ambulatory Visit (INDEPENDENT_AMBULATORY_CARE_PROVIDER_SITE_OTHER): Payer: Medicare Other

## 2022-09-17 DIAGNOSIS — J309 Allergic rhinitis, unspecified: Secondary | ICD-10-CM | POA: Diagnosis not present

## 2022-09-17 NOTE — Telephone Encounter (Signed)
Caller & Relationship to patient: PT  Call back number: [phone]  Date of last office visit: 06/25/2022  Date of next office visit: 09/19/2022  Medication(s) to be refilled:  HYDROcodone-acetaminophen (NORCO) 5-325 MG tablet   Preferred Pharmacy:   [facility], [place] that Dr.John was not the prescriber of this medication and that I could not guarantee he would be able to fill it. PT still dealing with pain from broken tailbone and is now completely out.

## 2022-09-18 ENCOUNTER — Ambulatory Visit: Payer: Medicare Other

## 2022-09-18 MED ORDER — HYDROCODONE-ACETAMINOPHEN 5-325 MG PO TABS: 1.0000 | ORAL_TABLET | Freq: Four times a day (QID) | ORAL | 0 refills | Status: DC | PRN

## 2022-09-18 NOTE — Telephone Encounter (Signed)
Ok this is done, but needs ROV for further refills

## 2022-09-18 NOTE — Telephone Encounter (Signed)
Please advise as this medication was prescribed on 09/12/22 by Dr.John Molphus.

## 2022-09-19 ENCOUNTER — Ambulatory Visit (INDEPENDENT_AMBULATORY_CARE_PROVIDER_SITE_OTHER): Payer: Medicare Other

## 2022-09-19 ENCOUNTER — Ambulatory Visit (INDEPENDENT_AMBULATORY_CARE_PROVIDER_SITE_OTHER): Payer: Medicare Other | Admitting: Internal Medicine

## 2022-09-19 ENCOUNTER — Encounter: Payer: Self-pay | Admitting: Internal Medicine

## 2022-09-19 VITALS — BP 118/78 | HR 68 | Temp 98.0°F | Ht 64.0 in | Wt 118.0 lb

## 2022-09-19 DIAGNOSIS — Z0001 Encounter for general adult medical examination with abnormal findings: Secondary | ICD-10-CM

## 2022-09-19 DIAGNOSIS — M533 Sacrococcygeal disorders, not elsewhere classified: Secondary | ICD-10-CM | POA: Diagnosis not present

## 2022-09-19 DIAGNOSIS — R1010 Upper abdominal pain, unspecified: Secondary | ICD-10-CM | POA: Diagnosis not present

## 2022-09-19 DIAGNOSIS — E559 Vitamin D deficiency, unspecified: Secondary | ICD-10-CM | POA: Diagnosis not present

## 2022-09-19 DIAGNOSIS — J4550 Severe persistent asthma, uncomplicated: Secondary | ICD-10-CM

## 2022-09-19 DIAGNOSIS — K5900 Constipation, unspecified: Secondary | ICD-10-CM | POA: Diagnosis not present

## 2022-09-19 MED ORDER — HYDROCODONE-ACETAMINOPHEN 5-325 MG PO TABS: 1.0000 | ORAL_TABLET | Freq: Four times a day (QID) | ORAL | 0 refills | Status: DC | PRN

## 2022-09-19 NOTE — Assessment & Plan Note (Addendum)
C/w persistent sympt intraabd hernia, non toxic, has f/u with Gen Surgury Jan 11, for refill vicodin prn

## 2022-09-19 NOTE — Assessment & Plan Note (Signed)
Improving, declines referral to PT

## 2022-09-19 NOTE — Patient Instructions (Addendum)
Please have your Shingrix (shingles) shots done at your local pharmacy.  Please continue all other medications as before, and refills have been done if requested.  Please have the pharmacy call with any other refills you may need.  Please continue your efforts at being more active, low cholesterol diet, and weight control.  You are otherwise up to date with prevention measures today.  Please keep your appointments with your specialists as you may have planned - general surgury on Jan 11  We can hold on lab testing today  Please make an Appointment to return in 6 months, or sooner if needed

## 2022-09-19 NOTE — Progress Notes (Signed)
Patient ID: Lori [name], female   DOB: [birth year], 67 y.o.   MRN: [id number]         Chief Complaint:: wellness exam and Follow-up  Intraabd hernia- upper abd pain, constipation, recent sacral fracture, low vit d       HPI:  [name] is a 67 y.o. female here for wellness exam; plans to have shingrix at pharmacy, declines covid booster, mammogram, dxa, colonoscopy for now, o/w up to date                        Also has persistent upper abd pain as per seen in ED dec 27 with symptomatic Large foramen of Morgagni hernia containing the hepatic flexure and proximal transverse colon, as well as constipation, also several falls with sacral pain and fx per pt.  Pt denies chest pain, increased sob or doe, wheezing, orthopnea, PND, increased LE swelling, palpitations, dizziness or syncope.   Pt denies polydipsia, polyuria, or new focal neuro s/s.    Pt denies fever, wt loss, night sweats, loss of appetite, or other constitutional symptoms  Declines further labs otday   Wt Readings from Last 3 Encounters:  09/19/22 118 lb (53.5 kg)  09/11/22 117 lb (53.1 kg)  09/07/22 117 lb (53.1 kg)   BP Readings from Last 3 Encounters:  09/19/22 118/78  09/12/22 133/65  09/08/22 130/65   Immunization History  Administered Date(s) Administered   Fluad Quad(high Dose 65+) 05/29/2022   Influenza,inj,Quad PF,6+ Mos 12/03/2016, 05/18/2017, 05/20/2018, 05/25/2019   Influenza,inj,Quad PF,6-35 Mos 05/18/2019   Influenza-Unspecified 05/18/2017, 07/17/2018, 05/14/2021   PFIZER(Purple Top)SARS-COV-2 Vaccination 10/18/2019, 11/08/2019, 06/19/2020   Pneumococcal Conjugate-13 12/30/2014, 12/31/2016   Pneumococcal Polysaccharide-23 01/17/2014, 12/31/2017, 05/14/2021   Tdap 09/09/2013, 05/10/2016, 07/02/2022   Zoster Recombinat (Shingrix) 12/15/2016   Zoster, Live 08/31/2013   Health Maintenance Due  Topic Date Due   Zoster Vaccines- Shingrix (2 of 2) 02/09/2017      Past Medical History:  Diagnosis Date    Allergic rhinitis    Anemia    hgb 13.9 on 11/22/21   Ankle fracture 05/2016   Anxiety    Arthritis    Asthma    Barrett esophagus 2007   Chest pain    Hx, no current problems as of 11/22/21.  Recurrent episodes.  Has had 3 negative nuclear stress test, and a completely normal coronary CT angiogram   COPD (chronic obstructive pulmonary disease) with chronic bronchitis    & Emphysema   Depression    Duodenitis without mention of hemorrhage 2007   Esophageal reflux 2007   Esophageal stricture    Full dentures    patient does not wear dentures as of 11/22/21   H/O hiatal hernia    Hiatal hernia 2006,2007   HSV infection    Hyperlipidemia    rx crestor but pt not taking   Multiple fractures    from falls, fx rt. elbow, fx left wrist, bilateral ankles   Pneumonia 10/2016   Restrictive lung disease 06/30/2017   Ulcer    no current problems as of 11/22/21   Past Surgical History:  Procedure Laterality Date   ABDOMINAL HYSTERECTOMY     BSO   APPENDECTOMY     BIOPSY  04/04/2022   Procedure: BIOPSY;  Surgeon: Irving Copas., MD;  Location: [place];  Service: Gastroenterology;;   CESAREAN SECTION     x 2   CHOLECYSTECTOMY     CHONDROPLASTY Left 01/06/2015  Procedure: CHONDROPLASTY;  Surgeon: Marybelle Killings, MD;  Location: [place];  Service: Orthopedics;  Laterality: Left;   COLONOSCOPY     CT CTA CORONARY W/CA SCORE W/CM &/OR WO/CM  12/02/2016   CORONARY CALCIUM SCORE 0.  NO EVIDENCE OF CAD.  Normal-sized PA.  Normal pulmonary venous drainage the left atrium.  No ASD or VSD.   ELBOW FRACTURE SURGERY     right   ESOPHAGOGASTRODUODENOSCOPY (EGD) WITH PROPOFOL N/A 04/04/2022   Procedure: ESOPHAGOGASTRODUODENOSCOPY (EGD) WITH PROPOFOL;  Surgeon: Rush Landmark Telford Nab., MD;  Location: [place];  Service: Gastroenterology;  Laterality: N/A;   EUS N/A 04/04/2022   Procedure: UPPER ENDOSCOPIC ULTRASOUND (EUS) RADIAL;  Surgeon: Irving Copas., MD;   Location: [place];  Service: Gastroenterology;  Laterality: N/A;   EYE SURGERY Bilateral    cataracts removed   KNEE ARTHROSCOPY WITH EXCISION PLICA Left 86[birth year]/1950   Procedure: KNEE ARTHROSCOPY WITH EXCISION PLICA;  Surgeon: Marybelle Killings, MD;  Location: [place];  Service: Orthopedics;  Laterality: Left;   LIGAMENT REPAIR Right 11/28/2021   Procedure: RIGHT THUMB LIGAMENT RECONSTRUCTION AND TENDON INTERPOSITION;  Surgeon: Sherilyn Cooter, MD;  Location: [place];  Service: Orthopedics;  Laterality: Right;   Lower Extremity Arterial Dopplers  07/06/2013   RABI 1.0, LABI 1.1.; No evidence of significant vascular atherogenic plaque   NECK SURGERY     NM MYOVIEW LTD  09/2014   a) LOW RISK. Normal EF of 65%, no RWMA. NO ISCHEMIA OR INFARCTION.;; b) 05/02/2009: Normal study.  LOW RISK.  No ischemia or infarction.  EF 74%.  Breast attenuation noted.   TONSILLECTOMY     TRANSTHORACIC ECHOCARDIOGRAM  03/28/2021   a) Normal EF 60 to 65%.  GR 1 DD.  No R WMA.  Normal RV size and function.  Normal RAP.  Normal valves. => NORMAL;; b) 04/23/16/2010: Normal study.  LOW RISK.  No ischemia or infarction.  EF 74%.  Breast attenuation noted.17/2010: ([facility]) EF>55%.  Normal wall motion.  Normal valves.   WRIST FRACTURE SURGERY     left, has plate    reports that she has been smoking cigarettes. She started smoking about 45 years ago. She has a 19.50 pack-year smoking history. She has never used smokeless tobacco. She reports that she does not drink alcohol and does not use drugs. family history includes Dementia in her mother; Diabetes in her maternal aunt, maternal grandmother, and another family member; Emphysema in her father; Heart disease in her brother. Allergies  Allergen Reactions   Advil [Ibuprofen] Nausea And Vomiting   Bee Venom Swelling   Codeine Nausea Only and Other (See Comments)    CAUSES ULCERS   Clonidine Derivatives Other (See Comments)    Unknown    Statins Other (See  Comments)    unknown   Latex Rash   Levaquin [Levofloxacin] Nausea Only   Propoxyphene N-Acetaminophen Nausea Only   Current Outpatient Medications on File Prior to Visit  Medication Sig Dispense Refill   AIMOVIG 140 MG/ML SOAJ Inject 140 mg into the skin every 30 (thirty) days.     albuterol (PROAIR HFA) 108 (90 Base) MCG/ACT inhaler Inhale 2 puffs into the lungs every 6 (six) hours as needed for wheezing or shortness of breath. 1 each 2   ALPRAZolam (XANAX) 1 MG tablet TAKE 1 TABLET BY MOUTH AT BEDTIME AS NEEDED FOR ANXIETY 90 tablet 1   Benralizumab (FASENRA) 30 MG/ML SOSY INJECT '30MG'$  SUBCUTANEOUSLY  EVERY 8 WEEKS 1 mL  6   dexlansoprazole (DEXILANT) 60 MG capsule Take 1 capsule (60 mg total) by mouth daily. 90 capsule 3   EPINEPHrine 0.3 mg/0.3 mL IJ SOAJ injection INJECT AS DIRECTED FOR SEVERE ALLERGIC REACTION 2 each 1   gabapentin (NEURONTIN) 300 MG capsule Take 300 mg by mouth in the morning.     gabapentin (NEURONTIN) 600 MG tablet Take by mouth.     lidocaine (LIDODERM) 5 % Place 1 patch onto the skin daily. Remove & Discard patch within 12 hours or as directed by MD 30 patch 0   montelukast (SINGULAIR) 10 MG tablet Take 1 tablet (10 mg total) by mouth at bedtime. 90 tablet 3   nitroGLYCERIN (NITROSTAT) 0.4 MG SL tablet PLACE 1 TABLET UNDER THE TONGUE EVERY 5 MINUTES AS NEEDED FOR CHEST PAIN 25 tablet 3   predniSONE (DELTASONE) 10 MG tablet 3 tabs by mouth per day for 3 days,2tabs per day for 3 days,1tab per day for 3 days 18 tablet 0   promethazine (PHENERGAN) 25 MG tablet Take by mouth.     promethazine-dextromethorphan (PROMETHAZINE-DM) 6.25-15 MG/5ML syrup Take 5 mLs by mouth 4 (four) times daily as needed for cough. 118 mL 0   tiZANidine (ZANAFLEX) 2 MG tablet Take 1 tablet (2 mg total) by mouth every 8 (eight) hours as needed for muscle spasms. 15 tablet 0   valACYclovir (VALTREX) 1000 MG tablet Take 1 tablet by mouth twice daily (Patient taking differently: Take 1,000 mg by  mouth in the morning.) 180 tablet 1   Current Facility-Administered Medications on File Prior to Visit  Medication Dose Route Frequency Provider Last Rate Last Admin   Benralizumab SOSY 30 mg  30 mg Subcutaneous Q8 weeks Valentina Shaggy, MD   30 mg at 09/19/22 0955        ROS:  All others reviewed and negative.  Objective        PE:  BP 118/78 (BP Location: Left Arm, Patient Position: Sitting, Cuff Size: Small)   Pulse 68   Temp 98 F (36.7 C) (Oral)   Ht '5\' 4"'$  (1.626 m)   Wt 118 lb (53.5 kg)   SpO2 99%   BMI 20.25 kg/m                 Constitutional: Pt appears in NAD               HENT: Head: NCAT.                Right Ear: External ear normal.                 Left Ear: External ear normal.                Eyes: . Pupils are equal, round, and reactive to light. Conjunctivae and EOM are normal               Nose: without d/c or deformity               Neck: Neck supple. Gross normal ROM               Cardiovascular: Normal rate and regular rhythm.                 Pulmonary/Chest: Effort normal and breath sounds without rales or wheezing.                Abd:  Soft, mod tender right > left upper abd, ND, + BS, no organomegaly  Neurological: Pt is alert. At baseline orientation, motor grossly intact               Skin: Skin is warm. No rashes, no other new lesions, LE edema - none               Psychiatric: Pt behavior is normal without agitation   Micro: none  Cardiac tracings I have personally interpreted today:  none  Pertinent Radiological findings (summarize): none   Lab Results  Component Value Date   WBC 10.6 (H) 09/11/2022   HGB 13.7 09/11/2022   HCT 40.1 09/11/2022   PLT 281 09/11/2022   GLUCOSE 99 09/11/2022   CHOL 246 (H) 01/10/2022   TRIG 328.0 (H) 01/10/2022   HDL 41.40 01/10/2022   LDLDIRECT 165.0 01/10/2022   LDLCALC Comment 03/12/2017   ALT 13 09/11/2022   AST 15 09/11/2022   NA 137 09/11/2022   K 4.4 09/11/2022   CL 108  09/11/2022   CREATININE 0.78 09/11/2022   BUN 17 09/11/2022   CO2 21 (L) 09/11/2022   TSH 1.42 01/10/2022   INR 0.9 08/31/2008   HGBA1C 5.3 01/10/2022   Assessment/Plan:  TANEAL SONNTAG is a 67 y.o. White or Caucasian [1] female with  has a past medical history of Allergic rhinitis, Anemia, Ankle fracture (05/2016), Anxiety, Arthritis, Asthma, Barrett esophagus (2007), Chest pain, COPD (chronic obstructive pulmonary disease) with chronic bronchitis, Depression, Duodenitis without mention of hemorrhage (2007), Esophageal reflux (2007), Esophageal stricture, Full dentures, H/O hiatal hernia, Hiatal hernia (1517,6160), HSV infection, Hyperlipidemia, Multiple fractures, Pneumonia (10/2016), Restrictive lung disease (06/30/2017), and Ulcer.  Encounter for well adult exam with abnormal findings Age and sex appropriate education and counseling updated with regular exercise and diet Referrals for preventative services - declines mammogram, dxa, colonoscopy for now Immunizations addressed - for shingrix at pharmacy, declines covid booster Smoking counseling  - none needed Evidence for depression or other mood disorder - none significant Most recent labs reviewed. I have personally reviewed and have noted: 1) the patient's medical and social history 2) The patient's current medications and supplements 3) The patient's height, weight, and BMI have been recorded in the chart   Vitamin D deficiency Last vitamin D Lab Results  Component Value Date   VD25OH 22.86 (L) 01/10/2022   Low, reminded to start oral replacement   Coccyx pain Improving, declines referral to PT  Upper abdominal pain C/w persistent sympt intraabd hernia, non toxic, has f/u with [facility] Jan 11, for refill vicodin prn   Constipation Improved per pt, but for colace 100 bid prn, and miralax 17 gm daily prn,  to f/u any worsening symptoms or concerns  Followup: Return in about 6 months (around 03/20/2023).  Cathlean Cower, MD 09/19/2022 1:09 PM [facility]

## 2022-09-19 NOTE — Assessment & Plan Note (Signed)
Last vitamin D Lab Results  Component Value Date   VD25OH 22.86 (L) 01/10/2022   Low, reminded to start oral replacement

## 2022-09-19 NOTE — Assessment & Plan Note (Signed)
Improved per pt, but for colace 100 bid prn, and miralax 17 gm daily prn,  to f/u any worsening symptoms or concerns

## 2022-09-19 NOTE — Assessment & Plan Note (Signed)
Age and sex appropriate education and counseling updated with regular exercise and diet Referrals for preventative services - declines mammogram, dxa, colonoscopy for now Immunizations addressed - for shingrix at pharmacy, declines covid booster Smoking counseling  - none needed Evidence for depression or other mood disorder - none significant Most recent labs reviewed. I have personally reviewed and have noted: 1) the patient's medical and social history 2) The patient's current medications and supplements 3) The patient's height, weight, and BMI have been recorded in the chart

## 2022-09-25 ENCOUNTER — Other Ambulatory Visit: Payer: Self-pay | Admitting: Family

## 2022-09-26 ENCOUNTER — Ambulatory Visit (INDEPENDENT_AMBULATORY_CARE_PROVIDER_SITE_OTHER): Payer: Medicare Other

## 2022-09-26 ENCOUNTER — Ambulatory Visit: Payer: Self-pay | Admitting: General Surgery

## 2022-09-26 DIAGNOSIS — Q790 Congenital diaphragmatic hernia: Secondary | ICD-10-CM | POA: Diagnosis not present

## 2022-09-26 DIAGNOSIS — J309 Allergic rhinitis, unspecified: Secondary | ICD-10-CM

## 2022-09-26 NOTE — H&P (Signed)
DATE OF ENCOUNTER: 09/26/2022   Subjective    Chief Complaint: New Consultation (Hernia )       History of Present Illness: Lori [name] is a 67 y.o. female who is seen today as an office consultation at the request of Dr. Room for evaluation of New Consultation (Hernia ) .     Patient is a 67 year old female, with a history of hyperlipidemia, tobacco abuse, COPD, gastroparesis history of MI, who comes in secondary to a more gagging hernia.   Patient states that she has had pain in her anterior chest and abdomen.  She states this is fairly frequent.  Patient underwent workup with a CT scan.  Patient was found to have a Morgagni  hernia with transverse colon up in the chest compartment.  She denies any nausea vomiting or diarrhea.   Patient states she has had previous C-section in the past.   Patient states that she sees Dr. Ellyn Hack as her cardiologist.  She does state that she had a previous MI in the past.  She states that she has nitroglycerin.     Review of Systems: A complete review of systems was obtained from the patient.  I have reviewed this information and discussed as appropriate with the patient.  See HPI as well for other ROS.   Review of Systems  Constitutional:  Negative for fever.  HENT:  Negative for congestion.   Eyes:  Negative for blurred vision.  Respiratory:  Negative for cough, shortness of breath and wheezing.   Cardiovascular:  Negative for chest pain and palpitations.  Gastrointestinal:  Positive for abdominal pain. Negative for heartburn.  Genitourinary:  Negative for dysuria.  Musculoskeletal:  Negative for myalgias.  Skin:  Negative for rash.  Neurological:  Negative for dizziness and headaches.  Psychiatric/Behavioral:  Negative for depression and suicidal ideas.   All other systems reviewed and are negative.       Medical History: Past Medical History      Past Medical History:  Diagnosis Date   Anxiety     Arthritis     Asthma,  unspecified asthma severity, unspecified whether complicated, unspecified whether persistent     COPD (chronic obstructive pulmonary disease) (CMS-HCC)     GERD (gastroesophageal reflux disease)     Sleep apnea          There is no problem list on file for this patient.     Past Surgical History       Past Surgical History:  Procedure Laterality Date   Hand Surgery  Right 11/28/2021    Thumb        Allergies       Allergies  Allergen Reactions   Codeine Nausea and Nausea And Vomiting      CAUSES ULCERS   Ibuprofen Nausea And Vomiting   Venom-Honey Bee Swelling   Hydrocodone-Acetaminophen Nausea And Vomiting   Latex Rash   Levofloxacin Nausea And Vomiting   Propoxyphene N-Acetaminophen Nausea And Vomiting              Current Outpatient Medications on File Prior to Visit  Medication Sig Dispense Refill   albuterol 90 mcg/actuation inhaler Inhale into the lungs       dexlansoprazole (DEXILANT) 60 mg DR capsule Take by mouth       montelukast (SINGULAIR) 10 mg tablet Take 10 mg by mouth at bedtime       nitroGLYcerin (NITROSTAT) 0.4 MG SL tablet Place 1 tablet under the tongue every 5 (five)  minutes as needed        No current facility-administered medications on file prior to visit.      Family History  History reviewed. No pertinent family history.      Social History        Tobacco Use  Smoking Status Every Day   Packs/day: .5   Types: Cigarettes  Smokeless Tobacco Never      Social History  Social History         Socioeconomic History   Marital status: Widowed  Tobacco Use   Smoking status: Every Day      Packs/day: .5      Types: Cigarettes   Smokeless tobacco: Never  Substance and Sexual Activity   Alcohol use: Not Currently   Drug use: Never        Objective:         Vitals:    09/26/22 0848  BP: (!) 142/82  Pulse: 77  Temp: 36.2 C (97.1 F)  SpO2: 96%  Weight: 53.1 kg (117 lb)  Height: 162.6 cm ('5\' 4"'$ )    Body mass index  is 20.08 kg/m.   Physical Exam Constitutional:      Appearance: Normal appearance.  HENT:     Head: Normocephalic and atraumatic.     Mouth/Throat:     Mouth: Mucous membranes are moist.     Pharynx: Oropharynx is clear.  Eyes:     General: No scleral icterus.    Pupils: Pupils are equal, round, and reactive to light.  Cardiovascular:     Rate and Rhythm: Normal rate and regular rhythm.     Pulses: Normal pulses.     Heart sounds: No murmur heard.    No friction rub. No gallop.  Pulmonary:     Effort: Pulmonary effort is normal. No respiratory distress.     Breath sounds: Normal breath sounds. No stridor.  Abdominal:     General: Abdomen is flat.  Musculoskeletal:        General: No swelling.  Skin:    General: Skin is warm.  Neurological:     General: No focal deficit present.     Mental Status: She is alert and oriented to person, place, and time. Mental status is at baseline.  Psychiatric:        Mood and Affect: Mood normal.        Thought Content: Thought content normal.        Judgment: Judgment normal.            Assessment and Plan:  Diagnoses and all orders for this visit:   Morgagni hernia     Lori [name] is a 67 y.o. female    Patient with a small organic hernia on CT scan.  There is a significant mount of colon within the chest cavity. Will have her evaluated by cardiology prior to scheduling surgery.  I did discuss with her once we get that clearance from cardiology we will have our schedulers reach out to her.    We will proceed to the OR for a robotic/laparoscopic diaphragmatic hernia repair with mesh All risks and benefits were discussed with the patient, to generally include infection, bleeding, damage to surrounding structures, possible pneumothorax, and recurrence. Alternatives were offered and described.  All questions were answered and the patient voiced understanding of the procedure and wishes to proceed at this point.  I did discuss with  her secondary to her smoking history she may have a higher chance  of recurrence.  She understands this risk.

## 2022-10-01 ENCOUNTER — Encounter: Payer: Self-pay | Admitting: Gastroenterology

## 2022-10-01 ENCOUNTER — Ambulatory Visit (INDEPENDENT_AMBULATORY_CARE_PROVIDER_SITE_OTHER): Payer: Medicare Other

## 2022-10-01 DIAGNOSIS — J309 Allergic rhinitis, unspecified: Secondary | ICD-10-CM

## 2022-10-02 ENCOUNTER — Telehealth: Payer: Self-pay

## 2022-10-02 ENCOUNTER — Telehealth: Payer: Self-pay | Admitting: *Deleted

## 2022-10-02 NOTE — Telephone Encounter (Signed)
   Name: Lori [name]  DOB: 1956-01-04  MRN: [id number]  Primary Cardiologist: Glenetta Hew, MD   Preoperative team, please contact this patient and set up a phone call appointment for further preoperative risk assessment. Please obtain consent and complete medication review. Thank you for your help.  I confirm that guidance regarding antiplatelet and oral anticoagulation therapy has been completed and, if necessary, noted below.  No medications requested to hold   Mable Fill, Marissa Nestle, NP 10/02/2022, 8:42 AM [place]

## 2022-10-02 NOTE — Telephone Encounter (Signed)
  Patient Consent for Virtual Visit        Lori [name] has provided verbal consent on 10/02/2022 for a virtual visit (video or telephone).   CONSENT FOR VIRTUAL VISIT FOR:  Lori [name]  By participating in this virtual visit I agree to the following:  I hereby voluntarily request, consent and authorize [facility] and its employed or contracted physicians, physician assistants, nurse practitioners or other licensed health care professionals (the Practitioner), to provide me with telemedicine health care services (the "Services") as deemed necessary by the treating Practitioner. I acknowledge and consent to receive the Services by the Practitioner via telemedicine. I understand that the telemedicine visit will involve communicating with the Practitioner through live audiovisual communication technology and the disclosure of certain medical information by electronic transmission. I acknowledge that I have been given the opportunity to request an in-person assessment or other available alternative prior to the telemedicine visit and am voluntarily participating in the telemedicine visit.  I understand that I have the right to withhold or withdraw my consent to the use of telemedicine in the course of my care at any time, without affecting my right to future care or treatment, and that the Practitioner or I may terminate the telemedicine visit at any time. I understand that I have the right to inspect all information obtained and/or recorded in the course of the telemedicine visit and may receive copies of available information for a reasonable fee.  I understand that some of the potential risks of receiving the Services via telemedicine include:  Delay or interruption in medical evaluation due to technological equipment failure or disruption; Information transmitted may not be sufficient (e.g. poor resolution of images) to allow for appropriate medical decision making by the  Practitioner; and/or  In rare instances, security protocols could fail, causing a breach of personal health information.  Furthermore, I acknowledge that it is my responsibility to provide information about my medical history, conditions and care that is complete and accurate to the best of my ability. I acknowledge that Practitioner's advice, recommendations, and/or decision may be based on factors not within their control, such as incomplete or inaccurate data provided by me or distortions of diagnostic images or specimens that may result from electronic transmissions. I understand that the practice of medicine is not an exact science and that Practitioner makes no warranties or guarantees regarding treatment outcomes. I acknowledge that a copy of this consent can be made available to me via my patient portal ([facility]), or I can request a printed copy by calling the office of [facility].    I understand that my insurance will be billed for this visit.   I have read or had this consent read to me. I understand the contents of this consent, which adequately explains the benefits and risks of the Services being provided via telemedicine.  I have been provided ample opportunity to ask questions regarding this consent and the Services and have had my questions answered to my satisfaction. I give my informed consent for the services to be provided through the use of telemedicine in my medical care

## 2022-10-02 NOTE — Telephone Encounter (Signed)
Spoke with patient who is agreeable to do a tele visit on 2/8 at 9:40 am. Med rec to be completed but consent has been given.

## 2022-10-02 NOTE — Telephone Encounter (Signed)
   Pre-operative Risk Assessment    Patient Name: Lori [name]  DOB: 07-20-1956 MRN: [id number]      Request for Surgical Clearance    Procedure:   HERNIA SURGERY  Date of Surgery:  Clearance TBD                                 Surgeon:  [name], MD Surgeon's Group or Practice Name:  [facility] Phone number:  [phone] Fax number:  [phone]   Type of Clearance Requested:   - Medical    Type of Anesthesia:  General    Additional requests/questions:    SignedJeanann Lewandowsky   10/02/2022, 7:01 AM

## 2022-10-04 ENCOUNTER — Other Ambulatory Visit: Payer: Self-pay | Admitting: Internal Medicine

## 2022-10-07 ENCOUNTER — Other Ambulatory Visit: Payer: Self-pay | Admitting: Internal Medicine

## 2022-10-07 ENCOUNTER — Telehealth: Payer: Self-pay | Admitting: Internal Medicine

## 2022-10-07 NOTE — Telephone Encounter (Signed)
Caller & Relationship to patient: Self  Call back number: [phone]   Date of last office visit: 1.4.24  Date of next office visit: 4.12.24  Medication(s) to be refilled:  meclizine (ANTIVERT) 25 MG tablet   Preferred Pharmacy:   [facility]   Phone: [phone]  Fax: [phone]

## 2022-10-08 ENCOUNTER — Ambulatory Visit (INDEPENDENT_AMBULATORY_CARE_PROVIDER_SITE_OTHER): Payer: Medicare Other

## 2022-10-08 ENCOUNTER — Telehealth: Payer: Self-pay | Admitting: Internal Medicine

## 2022-10-08 ENCOUNTER — Encounter: Payer: Medicare Other | Admitting: Gastroenterology

## 2022-10-08 DIAGNOSIS — J309 Allergic rhinitis, unspecified: Secondary | ICD-10-CM

## 2022-10-08 MED ORDER — MECLIZINE HCL 12.5 MG PO TABS: 12.5000 mg | ORAL_TABLET | Freq: Three times a day (TID) | ORAL | 1 refills | Status: DC | PRN

## 2022-10-08 NOTE — Telephone Encounter (Signed)
Ok for meclizine 12.5 prn - done erx

## 2022-10-08 NOTE — Telephone Encounter (Signed)
Patient asked if a prescription for tramadol '50mg'$  could be sent in for her.

## 2022-10-09 ENCOUNTER — Other Ambulatory Visit: Payer: Self-pay | Admitting: Internal Medicine

## 2022-10-09 MED ORDER — TRAMADOL HCL 50 MG PO TABS: 50.0000 mg | ORAL_TABLET | Freq: Four times a day (QID) | ORAL | 1 refills | Status: DC | PRN

## 2022-10-09 NOTE — Telephone Encounter (Signed)
Done erx 

## 2022-10-09 NOTE — Telephone Encounter (Signed)
Patient states she has a high hernia that is enlarged and painful, pt is requesting Tramadol. Please send to [facility] on [address].

## 2022-10-14 DIAGNOSIS — G43719 Chronic migraine without aura, intractable, without status migrainosus: Secondary | ICD-10-CM | POA: Diagnosis not present

## 2022-10-15 IMAGING — DX DG CHEST 2V
2 series · 2 of 2 positions shown · non-contrast
Comparison: 10/20/2018 chest radiograph.

CLINICAL DATA: Chest wall pain, COPD, smoker

EXAM:
CHEST - 2 VIEW

[chest pa]
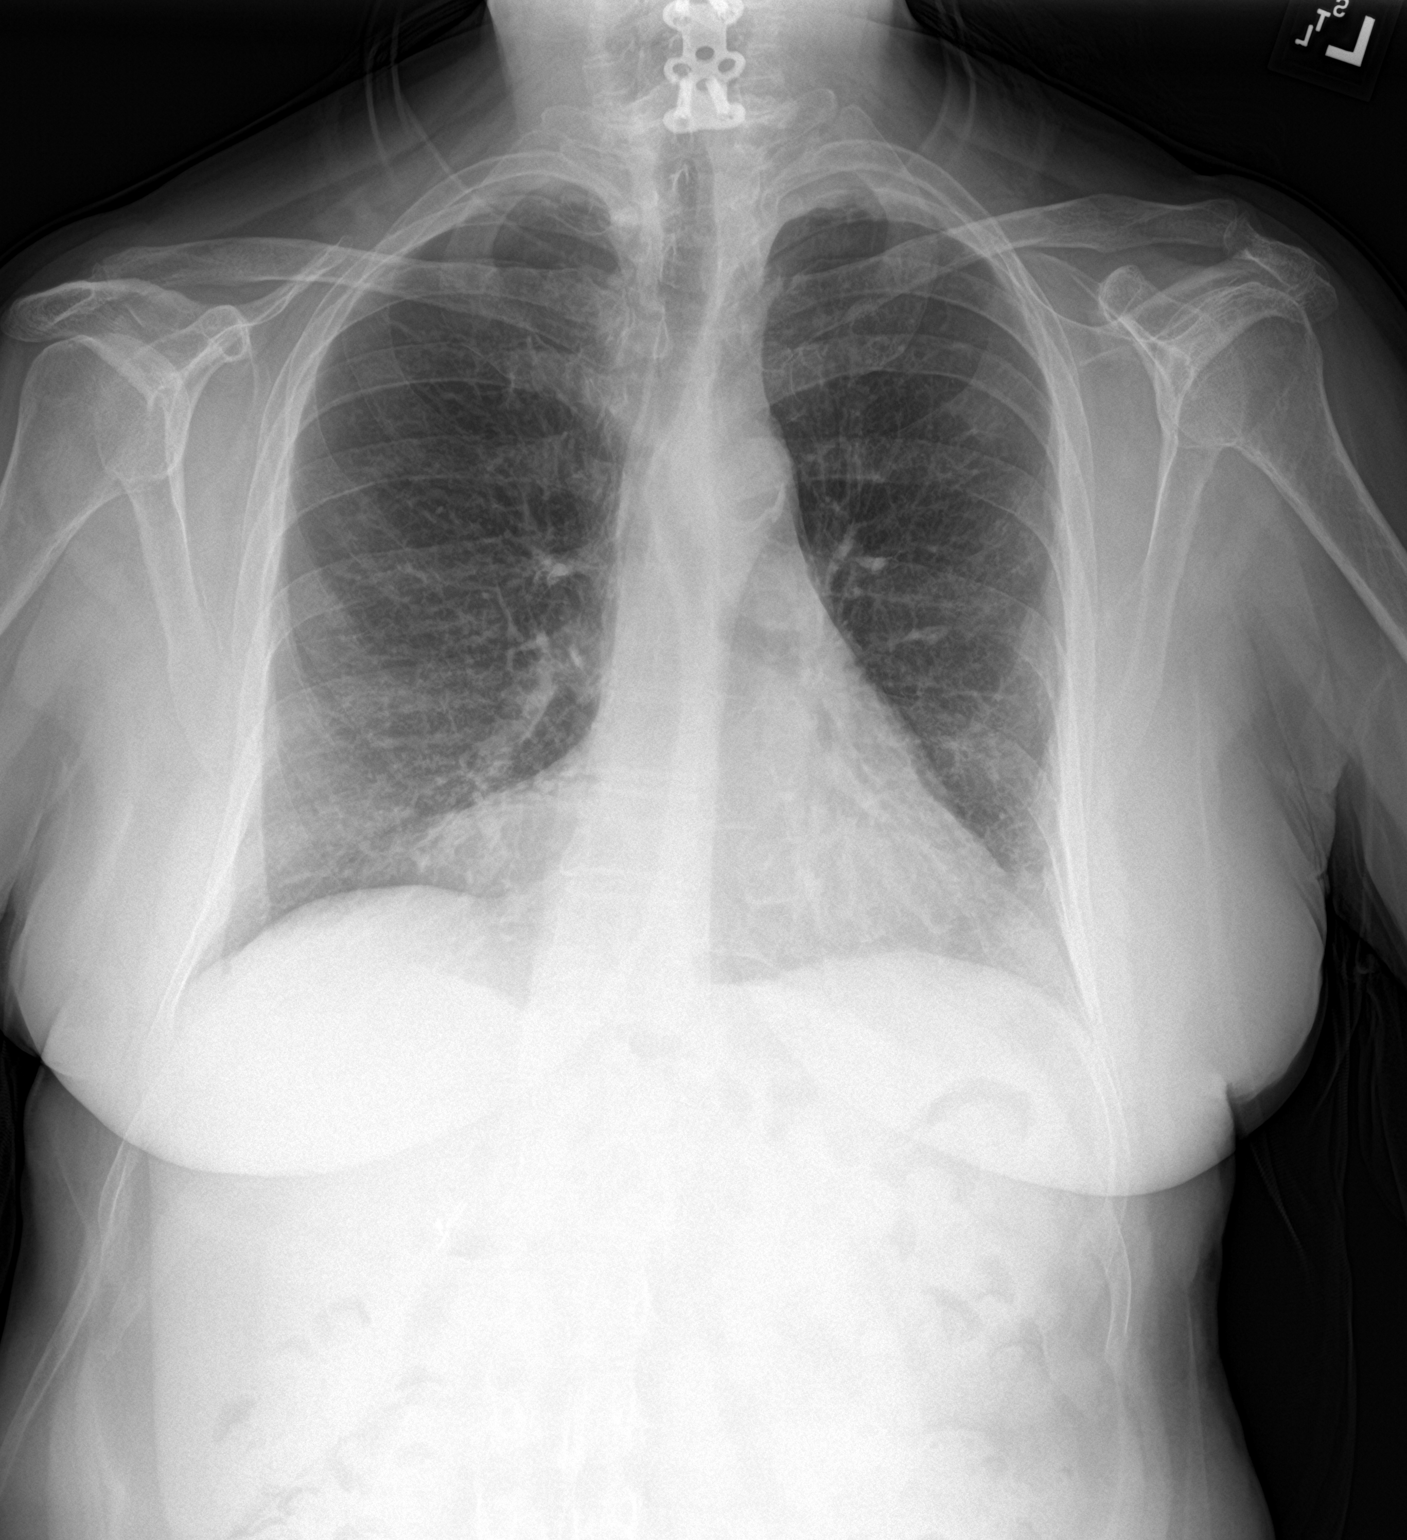

[chest lat]
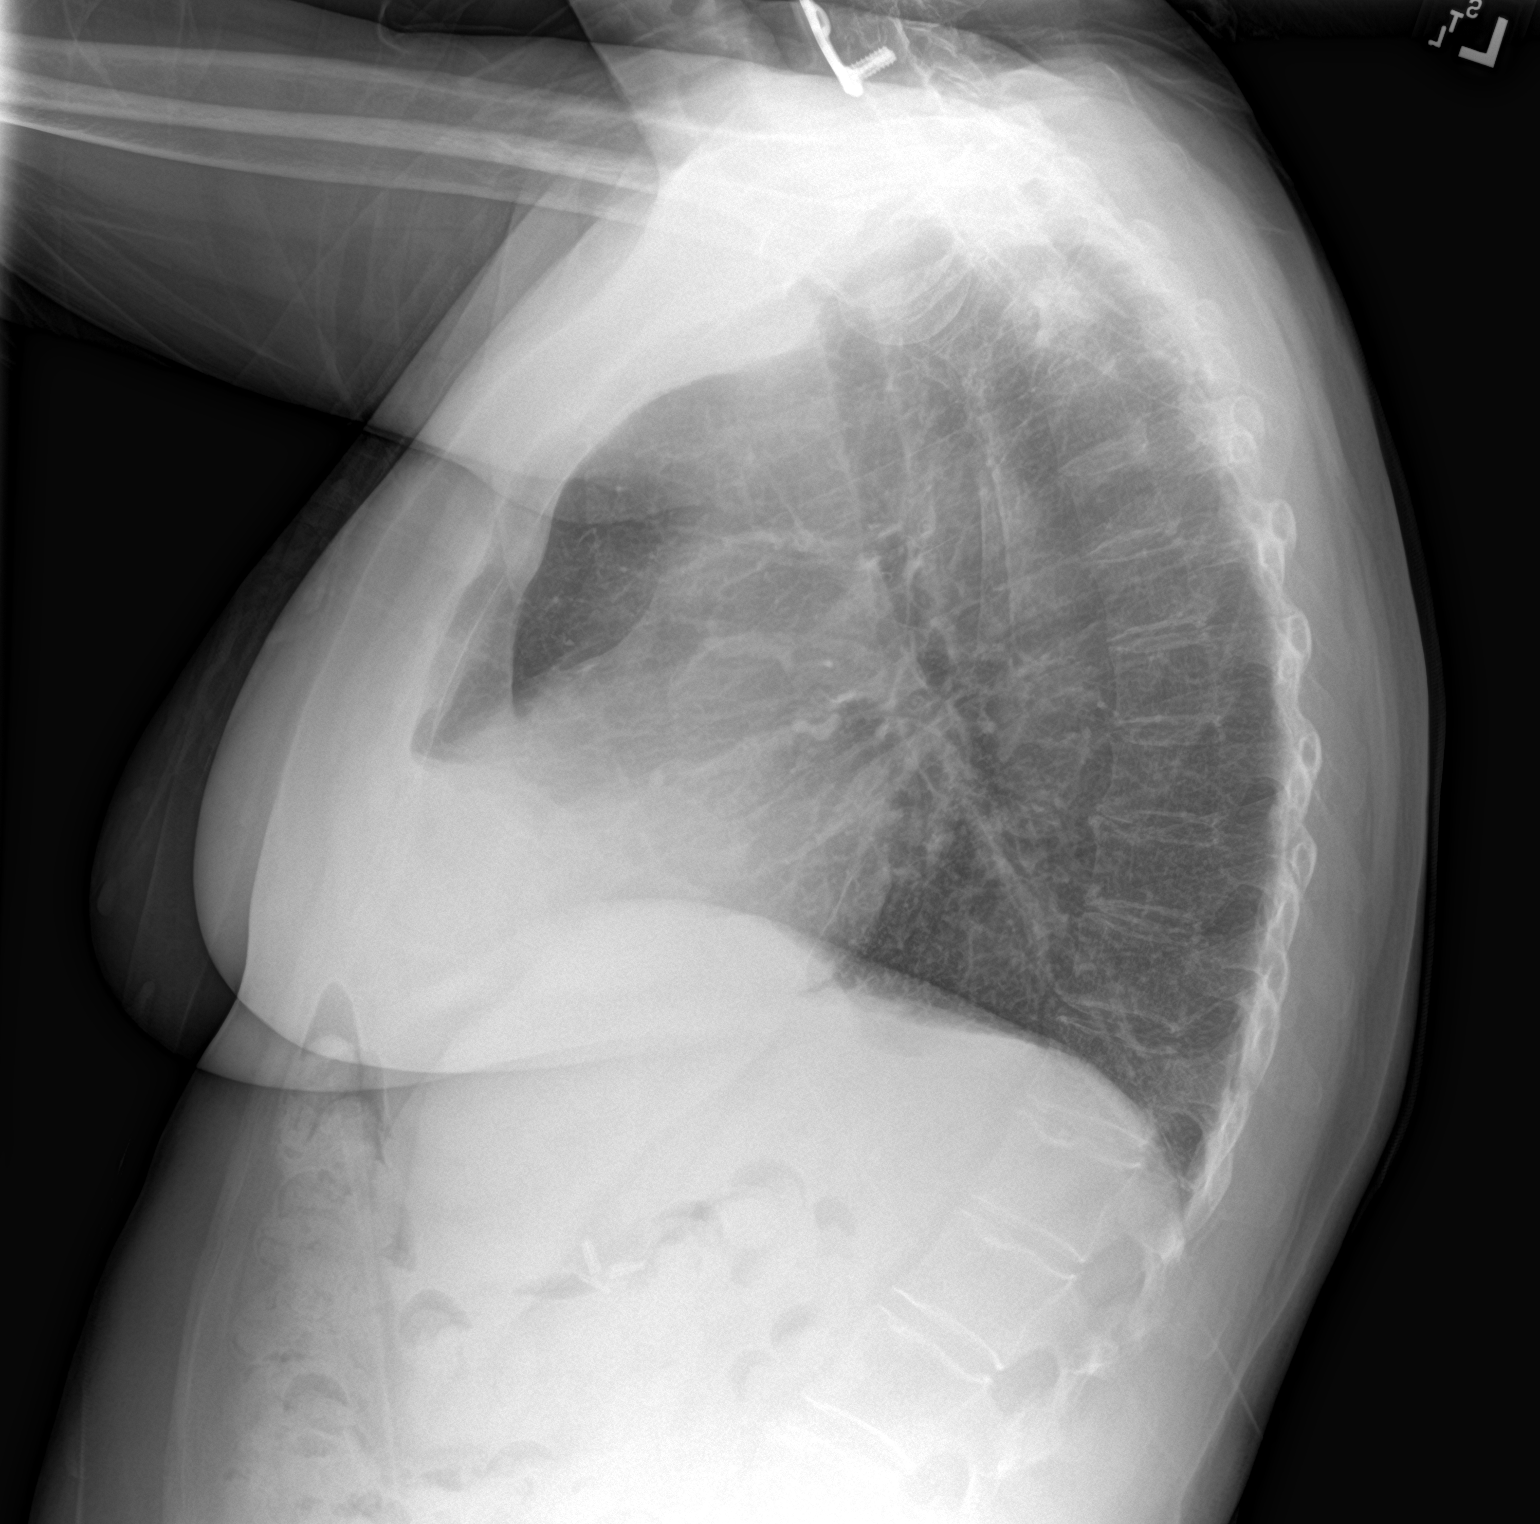

[2 of 2 positions shown; findings below may reference images not displayed]

FINDINGS: Partially visualized surgical hardware from ACDF. Stable
cardiomediastinal silhouette with normal heart size. No
pneumothorax. No pleural effusion. Lungs appear clear, with no acute
consolidative airspace disease and no pulmonary edema. Surgical
clips are seen in the right upper quadrant of the abdomen.
IMPRESSION: No active cardiopulmonary disease.

## 2022-10-23 NOTE — Progress Notes (Unsigned)
Virtual Visit via Telephone Note   Because of Lori [name]'s co-morbid illnesses, she is at least at moderate risk for complications without adequate follow up.  This format is felt to be most appropriate for this patient at this time.  The patient did not have access to video technology/had technical difficulties with video requiring transitioning to audio format only (telephone).  All issues noted in this document were discussed and addressed.  No physical exam could be performed with this format.  Please refer to the patient's chart for her consent to telehealth for [facility].  Evaluation Performed:  Preoperative cardiovascular risk assessment _____________   Date:  10/23/2022   Patient ID:  Lori [name], DOB 04-23-1956, MRN [id number] Patient Location:  Home Provider location:   Office  Primary Care Provider:  Biagio Borg, MD Primary Cardiologist:  Glenetta Hew, MD  Chief Complaint / Patient Profile   67 y.o. y/o female with a h/o COPD, HLD, restrictive lung disease who is pending hernia surgery and presents today for telephonic preoperative cardiovascular risk assessment.  History of Present Illness    Lori [name] is a 67 y.o. female who presents via audio/video conferencing for a telehealth visit today.  Pt was last seen in cardiology clinic on 07/13/2021 by Dr. Ellyn Hack.  At that time Lori [name] was doing well with occasional intermittent chest pain symptoms and patient was continuing to smoke.  The patient is now pending procedure as outlined above. Since her last visit, she reports that she is doing well with no new cardiac complaints at this time.  She denies chest pain, shortness of breath, lower extremity edema, fatigue, palpitations, melena, hematuria, hemoptysis, diaphoresis, weakness, presyncope, syncope, orthopnea, and PND.   Past Medical History    Past Medical History:  Diagnosis Date   Allergic rhinitis    Anemia    hgb 13.9 on  11/22/21   Ankle fracture 05/2016   Anxiety    Arthritis    Asthma    Barrett esophagus 2007   Chest pain    Hx, no current problems as of 11/22/21.  Recurrent episodes.  Has had 3 negative nuclear stress test, and a completely normal coronary CT angiogram   COPD (chronic obstructive pulmonary disease) with chronic bronchitis    & Emphysema   Depression    Duodenitis without mention of hemorrhage 2007   Esophageal reflux 2007   Esophageal stricture    Full dentures    patient does not wear dentures as of 11/22/21   H/O hiatal hernia    Hiatal hernia 2006,2007   HSV infection    Hyperlipidemia    rx crestor but pt not taking   Multiple fractures    from falls, fx rt. elbow, fx left wrist, bilateral ankles   Pneumonia 10/2016   Restrictive lung disease 06/30/2017   Ulcer    no current problems as of 11/22/21   Past Surgical History:  Procedure Laterality Date   ABDOMINAL HYSTERECTOMY     BSO   APPENDECTOMY     BIOPSY  04/04/2022   Procedure: BIOPSY;  Surgeon: Irving Copas., MD;  Location: [place];  Service: Gastroenterology;;   CESAREAN SECTION     x 2   CHOLECYSTECTOMY     CHONDROPLASTY Left 01/06/2015   Procedure: CHONDROPLASTY;  Surgeon: Marybelle Killings, MD;  Location: [place];  Service: Orthopedics;  Laterality: Left;   COLONOSCOPY     CT CTA CORONARY W/CA SCORE  W/CM &/OR WO/CM  12/02/2016   CORONARY CALCIUM SCORE 0.  NO EVIDENCE OF CAD.  Normal-sized PA.  Normal pulmonary venous drainage the left atrium.  No ASD or VSD.   ELBOW FRACTURE SURGERY     right   ESOPHAGOGASTRODUODENOSCOPY (EGD) WITH PROPOFOL N/A 04/04/2022   Procedure: ESOPHAGOGASTRODUODENOSCOPY (EGD) WITH PROPOFOL;  Surgeon: Rush Landmark Telford Nab., MD;  Location: [place];  Service: Gastroenterology;  Laterality: N/A;   EUS N/A 04/04/2022   Procedure: UPPER ENDOSCOPIC ULTRASOUND (EUS) RADIAL;  Surgeon: Irving Copas., MD;  Location: [place];  Service:  Gastroenterology;  Laterality: N/A;   EYE SURGERY Bilateral    cataracts removed   KNEE ARTHROSCOPY WITH EXCISION PLICA Left 81/82/9937   Procedure: KNEE ARTHROSCOPY WITH EXCISION PLICA;  Surgeon: Marybelle Killings, MD;  Location: [place];  Service: Orthopedics;  Laterality: Left;   LIGAMENT REPAIR Right 11/28/2021   Procedure: RIGHT THUMB LIGAMENT RECONSTRUCTION AND TENDON INTERPOSITION;  Surgeon: Sherilyn Cooter, MD;  Location: [place];  Service: Orthopedics;  Laterality: Right;   Lower Extremity Arterial Dopplers  07/06/2013   RABI 1.0, LABI 1.1.; No evidence of significant vascular atherogenic plaque   NECK SURGERY     NM MYOVIEW LTD  09/2014   a) LOW RISK. Normal EF of 65%, no RWMA. NO ISCHEMIA OR INFARCTION.;; b) 05/02/2009: Normal study.  LOW RISK.  No ischemia or infarction.  EF 74%.  Breast attenuation noted.   TONSILLECTOMY     TRANSTHORACIC ECHOCARDIOGRAM  03/28/2021   a) Normal EF 60 to 65%.  GR 1 DD.  No R WMA.  Normal RV size and function.  Normal RAP.  Normal valves. => NORMAL;; b) 04/23/16/2010: Normal study.  LOW RISK.  No ischemia or infarction.  EF 74%.  Breast attenuation noted.17/2010: ([facility]) EF>55%.  Normal wall motion.  Normal valves.   WRIST FRACTURE SURGERY     left, has plate    Allergies  Allergies  Allergen Reactions   Advil [Ibuprofen] Nausea And Vomiting   Bee Venom Swelling   Codeine Nausea Only and Other (See Comments)    CAUSES ULCERS   Clonidine Derivatives Other (See Comments)    Unknown    Statins Other (See Comments)    unknown   Latex Rash   Levaquin [Levofloxacin] Nausea Only   Propoxyphene N-Acetaminophen Nausea Only    Home Medications    Prior to Admission medications   Medication Sig Start Date End Date Taking? Authorizing Provider  AIMOVIG 140 MG/ML SOAJ Inject 140 mg into the skin every 30 (thirty) days. 01/09/21   [provider]  albuterol (PROAIR HFA) 108 (90 Base) MCG/ACT inhaler Inhale 2 puffs into the lungs  every 6 (six) hours as needed for wheezing or shortness of breath. 05/25/22   Allwardt, Randa Evens, PA-C  ALPRAZolam Duanne Moron) 1 MG tablet TAKE 1 TABLET BY MOUTH AT BEDTIME AS NEEDED FOR ANXIETY 07/08/22   Biagio Borg, MD  Benralizumab [facility]) 30 MG/ML SOSY INJECT '30MG'$  SUBCUTANEOUSLY  EVERY 8 WEEKS 09/12/22   Valentina Shaggy, MD  dexlansoprazole (DEXILANT) 60 MG capsule Take 1 capsule (60 mg total) by mouth daily. 06/25/22   Biagio Borg, MD  EPINEPHrine 0.3 mg/0.3 mL IJ SOAJ injection INJECT AS DIRECTED FOR SEVERE ALLERGIC REACTION 09/25/22   Valentina Shaggy, MD  gabapentin (NEURONTIN) 300 MG capsule Take 300 mg by mouth in the morning. 07/13/20   [provider]  gabapentin (NEURONTIN) 600 MG tablet Take by mouth. 04/30/22  [provider]  lidocaine (LIDODERM) 5 % Place 1 patch onto the skin daily. Remove & Discard patch within 12 hours or as directed by MD 09/05/22   Valentina Shaggy, MD  meclizine (ANTIVERT) 12.5 MG tablet Take 1 tablet (12.5 mg total) by mouth 3 (three) times daily as needed for dizziness. 10/08/22 10/08/23  Biagio Borg, MD  montelukast (SINGULAIR) 10 MG tablet Take 1 tablet (10 mg total) by mouth at bedtime. 06/25/22   Biagio Borg, MD  nitroGLYCERIN (NITROSTAT) 0.4 MG SL tablet PLACE 1 TABLET UNDER THE TONGUE EVERY 5 MINUTES AS NEEDED FOR CHEST PAIN 07/16/22   Leonie Man, MD  predniSONE (DELTASONE) 10 MG tablet 3 tabs by mouth per day for 3 days,2tabs per day for 3 days,1tab per day for 3 days 06/07/22   Biagio Borg, MD  promethazine (PHENERGAN) 25 MG tablet Take by mouth. 01/09/22   [provider]  promethazine-dextromethorphan (PROMETHAZINE-DM) 6.25-15 MG/5ML syrup Take 5 mLs by mouth 4 (four) times daily as needed for cough. 06/17/22   Biagio Borg, MD  tiZANidine (ZANAFLEX) 2 MG tablet Take 1 tablet (2 mg total) by mouth every 8 (eight) hours as needed for muscle spasms. 09/08/22   Quintella Reichert, MD  traMADol (ULTRAM) 50  MG tablet Take 1 tablet (50 mg total) by mouth every 6 (six) hours as needed. 10/09/22   Biagio Borg, MD  valACYclovir (VALTREX) 1000 MG tablet Take 1 tablet by mouth twice daily Patient taking differently: Take 1,000 mg by mouth in the morning. 07/30/21   Biagio Borg, MD    Physical Exam    Vital Signs:  Lori [name] does not have vital signs available for review today.  Given telephonic nature of communication, physical exam is limited. AAOx3. NAD. Normal affect.  Speech and respirations are unlabored.  Accessory Clinical Findings    None  Assessment & Plan    1.  Preoperative Cardiovascular Risk Assessment: -The patient affirms she has been doing well without any new cardiac symptoms. They are able to achieve 4 METS without cardiac limitations. Therefore, based on ACC/AHA guidelines, the patient would be at acceptable risk for the planned procedure without further cardiovascular testing. The patient was advised that if she develops new symptoms prior to surgery to contact our office to arrange for a follow-up visit, and she verbalized understanding.   Ms. Wayne's perioperative risk of a major cardiac event is 0.9% according to the Revised Cardiac Risk Index (RCRI).  Therefore, she is at low risk for perioperative complications.   Her functional capacity is fair at 4.31 METs according to the Duke Activity Status Index (DASI). Recommendations: According to ACC/AHA guidelines, no further cardiovascular testing needed.  The patient may proceed to surgery at acceptable risk.   Antiplatelet and/or Anticoagulation Recommendations: No medications requested at this time    The patient was advised that if she develops new symptoms prior to surgery to contact our office to arrange for a follow-up visit, and she verbalized understanding.    Time:   Today, I have spent 6 minutes with the patient with telehealth technology discussing medical history, symptoms, and management plan.      Mable Fill, Marissa Nestle, NP  10/23/2022, 8:31 PM

## 2022-10-24 ENCOUNTER — Ambulatory Visit: Payer: 59 | Attending: Cardiology

## 2022-10-24 DIAGNOSIS — Z01810 Encounter for preprocedural cardiovascular examination: Secondary | ICD-10-CM | POA: Diagnosis not present

## 2022-11-05 ENCOUNTER — Ambulatory Visit (INDEPENDENT_AMBULATORY_CARE_PROVIDER_SITE_OTHER): Payer: Medicare Other

## 2022-11-05 DIAGNOSIS — J309 Allergic rhinitis, unspecified: Secondary | ICD-10-CM | POA: Diagnosis not present

## 2022-11-06 NOTE — Progress Notes (Signed)
Surgical Instructions    Your procedure is scheduled on Tuesday November 12, 2022.  Report to [facility] Main Entrance "A" at 5:30 A.M., then check in with the Admitting office.  Call this number if you have problems the morning of surgery:  [phone]   If you have any questions prior to your surgery date call [phone]: Open Monday-Friday 8am-4pm If you experience any cold or flu symptoms such as cough, fever, chills, shortness of breath, etc. between now and your scheduled surgery, please notify us at the above number     Remember:  Do not eat after midnight the night before your surgery  You may drink clear liquids until 4:30 the morning of your surgery.   Clear liquids allowed are: Water, Non-Citrus Juices (without pulp), Carbonated Beverages, Clear Tea, Black Coffee ONLY (NO MILK, CREAM OR POWDERED CREAMER of any kind), and Gatorade     Enhanced Recovery after Surgery for Orthopedics Enhanced Recovery after Surgery is a protocol used to improve the stress on your body and your recovery after surgery.  Patient Instructions  The day of surgery (if you do NOT have diabetes):  Drink ONE (1) Pre-Surgery Clear Ensure by 4:30 am the morning of surgery   This drink was given to you during your hospital  pre-op appointment visit. Nothing else to drink after completing the  Pre-Surgery Clear Ensure.         If you have questions, please contact your surgeon's office.     Take these medicines the morning of surgery with A SIP OF WATER:   albuterol (PROAIR HFA) Please bring the day of surgery. gabapentin (NEURONTIN)  meclizine (ANTIVERT)  montelukast (SINGULAIR)  PARoxetine (PAXIL)  valACYclovir (VALTREX)  dexlansoprazole (DEXILANT)   If needed:  ALPRAZolam Duanne Moron)  nitroGLYCERIN (NITROSTAT  tiZANidine (ZANAFLEX)  EPINEPHrine pen  .ortho  As of today, STOP taking any Aspirin (unless otherwise instructed by your surgeon) Aleve, Naproxen, Ibuprofen, Motrin, Advil,  Goody's, BC's, all herbal medications, fish oil, and all vitamins.   Special instructions:    Oral Hygiene is also important to reduce your risk of infection.  Remember - BRUSH YOUR TEETH THE MORNING OF SURGERY WITH YOUR REGULAR TOOTHPASTE   [facility]- Preparing For Surgery  Before surgery, you can play an important role. Because skin is not sterile, your skin needs to be as free of germs as possible. You can reduce the number of germs on your skin by washing with CHG (chlorahexidine gluconate) Soap before surgery.  CHG is an antiseptic cleaner which kills germs and bonds with the skin to continue killing germs even after washing.     Please do not use if you have an allergy to CHG or antibacterial soaps. If your skin becomes reddened/irritated stop using the CHG.  Do not shave (including legs and underarms) for at least 48 hours prior to first CHG shower. It is OK to shave your face.  Please follow these instructions carefully.     Shower the NIGHT BEFORE SURGERY and the MORNING OF SURGERY with CHG Soap.   If you chose to wash your hair, wash your hair first as usual with your normal shampoo. After you shampoo, rinse your hair and body thoroughly to remove the shampoo.  Then [employer] and genitals (private parts) with your normal soap and rinse thoroughly to remove soap.  After that Use CHG Soap as you would any other liquid soap. You can apply CHG directly to the skin and wash gently with a scrungie  or a clean washcloth.   Apply the CHG Soap to your body ONLY FROM THE NECK DOWN.  Do not use on open wounds or open sores. Avoid contact with your eyes, ears, mouth and genitals (private parts). Wash Face and genitals (private parts)  with your normal soap.   Wash thoroughly, paying special attention to the area where your surgery will be performed.  Thoroughly rinse your body with warm water from the neck down.  DO NOT shower/wash with your normal soap after using and rinsing off the  CHG Soap.  Pat yourself dry with a CLEAN TOWEL.  Wear CLEAN PAJAMAS to bed the night before surgery  Place CLEAN SHEETS on your bed the night before your surgery  DO NOT SLEEP WITH PETS.   Day of Surgery:  Take a shower with CHG soap. Wear Clean/Comfortable clothing the morning of surgery Do not apply any deodorants/lotions.   Remember to brush your teeth WITH YOUR REGULAR TOOTHPASTE.    Do not wear jewelry or makeup. Do not wear lotions, powders, perfumes/cologne or deodorant. Do not shave 48 hours prior to surgery.  Men may shave face and neck. Do not bring valuables to the hospital. Do not wear nail polish, gel polish, artificial nails, or any other type of covering on natural nails (fingers and toes) If you have artificial nails or gel coating that need to be removed by a nail salon, please have this removed prior to surgery. Artificial nails or gel coating may interfere with anesthesia's ability to adequately monitor your vital signs.  [facility] is not responsible for any belongings or valuables.    Do NOT Smoke (Tobacco/Vaping)  24 hours prior to your procedure  If you use a CPAP at night, you may bring your mask for your overnight stay.   Contacts, glasses, hearing aids, dentures or partials may not be worn into surgery, please bring cases for these belongings   For patients admitted to the hospital, discharge time will be determined by your treatment team.   Patients discharged the day of surgery will not be allowed to drive home, and someone needs to stay with them for 24 hours.  SURGICAL WAITING ROOM VISITATION Patients having surgery or a procedure may have no more than 2 support people in the waiting area - these visitors may rotate.   Children under the age of 27 must have an adult with them who is not the patient. If the patient needs to stay at the hospital during part of their recovery, the visitor guidelines for inpatient rooms apply. Pre-op nurse will  coordinate an appropriate time for 1 support person to accompany patient in pre-op.  This support person may not rotate.   Please refer to [email or web address].hu for the visitor guidelines for Inpatients (after your surgery is over and you are in a regular room).   If you received a COVID test during your pre-op visit, it is requested that you wear a mask when out in public, stay away from anyone that may not be feeling well, and notify your surgeon if you develop symptoms. If you have been in contact with anyone that has tested positive in the last 10 days, please notify your surgeon.    Please read over the following fact sheets that you were given.

## 2022-11-07 ENCOUNTER — Other Ambulatory Visit: Payer: Self-pay

## 2022-11-07 ENCOUNTER — Encounter (HOSPITAL_COMMUNITY): Payer: Self-pay

## 2022-11-07 ENCOUNTER — Encounter (HOSPITAL_COMMUNITY)
Admission: RE | Admit: 2022-11-07 | Discharge: 2022-11-07 | Disposition: A | Discharge status: Home / Self Care | Payer: 59 | Source: Ambulatory Visit | Attending: General Surgery | Admitting: General Surgery

## 2022-11-07 VITALS — BP 117/64 | HR 68 | Temp 98.0°F | Resp 17 | Ht 64.0 in | Wt 118.8 lb

## 2022-11-07 DIAGNOSIS — Z01812 Encounter for preprocedural laboratory examination: Secondary | ICD-10-CM | POA: Diagnosis not present

## 2022-11-07 DIAGNOSIS — J449 Chronic obstructive pulmonary disease, unspecified: Secondary | ICD-10-CM | POA: Insufficient documentation

## 2022-11-07 DIAGNOSIS — R0789 Other chest pain: Secondary | ICD-10-CM | POA: Diagnosis not present

## 2022-11-07 DIAGNOSIS — J439 Emphysema, unspecified: Secondary | ICD-10-CM | POA: Insufficient documentation

## 2022-11-07 DIAGNOSIS — I700 Atherosclerosis of aorta: Secondary | ICD-10-CM | POA: Insufficient documentation

## 2022-11-07 DIAGNOSIS — J4540 Moderate persistent asthma, uncomplicated: Secondary | ICD-10-CM | POA: Insufficient documentation

## 2022-11-07 DIAGNOSIS — Z01818 Encounter for other preprocedural examination: Secondary | ICD-10-CM

## 2022-11-07 LAB — CBC
HCT: 43 % (ref 36.0–46.0)
Hemoglobin: 14.8 g/dL (ref 12.0–15.0)
MCH: 31.3 pg (ref 26.0–34.0)
MCHC: 34.4 g/dL (ref 30.0–36.0)
MCV: 90.9 fL (ref 80.0–100.0)
Platelets: 298 10*3/uL (ref 150–400)
RBC: 4.73 MIL/uL (ref 3.87–5.11)
RDW: 13.2 % (ref 11.5–15.5)
WBC: 9.1 10*3/uL (ref 4.0–10.5)
nRBC: 0 % (ref 0.0–0.2)

## 2022-11-07 LAB — BASIC METABOLIC PANEL
Anion gap: 9 (ref 5–15)
BUN: 10 mg/dL (ref 8–23)
CO2: 24 mmol/L (ref 22–32)
Calcium: 9 mg/dL (ref 8.9–10.3)
Chloride: 103 mmol/L (ref 98–111)
Creatinine, Ser: 1.01 mg/dL — ABNORMAL HIGH (ref 0.44–1.00)
GFR, Estimated: >60 mL/min (ref >60)
Glucose, Bld: 83 mg/dL (ref 70–99)
Potassium: 3.8 mmol/L (ref 3.5–5.1)
Sodium: 136 mmol/L (ref 135–145)

## 2022-11-07 NOTE — Progress Notes (Signed)
PCP - Dr. Cathlean Cower Cardiologist -  Dr. Selena Batten  PPM/ICD - Denies  Chest x-ray - 06/07/2022 EKG - 08/21/2022 Stress Test - 09/28/14 ECHO - 03/28/2021 Cardiac Cath - Denies  Sleep Study -  Sleep study over 5 years ago, states was not diagnosed with sleep apnea.  States she does snore and has to sleep elevated on a number of pillows CPAP -  Denies  Non-diabetic  Last dose of GLP1 agonist-  non-diabetic  Blood Thinner Instructions: Denies Aspirin Instructions:Denies  ERAS Protcol - Yes PRE-SURGERY Ensure   COVID TEST- Denies   Anesthesia review: Yes, Lung disease, COPD, Emphysema, Asthma, barrett esophagus  Patient denies shortness of breath, fever, cough and chest pain at PAT appointment   All instructions explained to the patient, with a verbal understanding of the material. Patient agrees to go over the instructions while at home for a better understanding. Patient also instructed to self quarantine after being tested for COVID-19. The opportunity to ask questions was provided.

## 2022-11-08 NOTE — Anesthesia Preprocedure Evaluation (Signed)
Anesthesia Evaluation  Patient identified by MRN, date of birth, ID band Patient awake    Reviewed: Allergy & Precautions, H&P , NPO status , Patient's Chart, lab work & pertinent test results  Airway Mallampati: II  TM Distance: >3 FB Neck ROM: Full    Dental no notable dental hx. (+) Edentulous Upper, Edentulous Lower, Dental Advisory Given   Pulmonary asthma , COPD,  COPD inhaler, Current Smoker and Patient abstained from smoking.   Pulmonary exam normal breath sounds clear to auscultation       Cardiovascular Exercise Tolerance: Good negative cardio ROS  Rhythm:Regular Rate:Normal     Neuro/Psych  Headaches  Anxiety Depression       GI/Hepatic Neg liver ROS, hiatal hernia,GERD  Medicated,,  Endo/Other  negative endocrine ROS    Renal/GU Renal disease  negative genitourinary   Musculoskeletal  (+) Arthritis , Osteoarthritis,  Fibromyalgia -  Abdominal   Peds  Hematology  (+) Blood dyscrasia, anemia   Anesthesia Other Findings   Reproductive/Obstetrics negative OB ROS                             Anesthesia Physical Anesthesia Plan  ASA: 2  Anesthesia Plan: General   Post-op Pain Management: Ofirmev IV (intra-op)*   Induction: Intravenous  PONV Risk Score and Plan: 3 and Ondansetron, Dexamethasone and Midazolam  Airway Management Planned: Oral ETT  Additional Equipment:   Intra-op Plan:   Post-operative Plan: Extubation in OR  Informed Consent: I have reviewed the patients History and Physical, chart, labs and discussed the procedure including the risks, benefits and alternatives for the proposed anesthesia with the patient or authorized representative who has indicated his/her understanding and acceptance.     Dental advisory given  Plan Discussed with: CRNA  Anesthesia Plan Comments: (PAT note by Karoline Caldwell, PA-C:  With cardiology for history of atypical chest  pain with multiple negative cardiac evaluations (Negative Myoview stress test, normal cardiac CTA).  Seen by Ambrose Pancoast, NP on 10/24/2022 for preop evaluation.  Per note, "The patient affirmsshehas been doing well without any new cardiac symptoms. They are able to achieve 4METS without cardiac limitations. Therefore, based on ACC/AHA guidelines, the patient would be at acceptable risk for the planned procedure without further cardiovascular testing. The patient was advised that if shedevelops new symptoms prior to surgery to contact our office to arrange for a follow-up visit, and sheverbalized understanding. Ms.Aderhold's perioperative risk of a major cardiac event is0.9% according to the Revised Cardiac Risk Index (RCRI). Therefore[facility]. Herfunctional capacity is fairat 4.31METs according to the Duke Activity Status Index (DASI). Recommendations: According to ACC/AHA guidelines, no further cardiovascular testing needed. The patient may proceed to surgery at acceptable risk."  Follows with allergy/asthma for history of moderate persistent asthma with COPD overlap. She continues to smoke. She is maintained on [place] and Singulair daily, Xopenex as needed, [place] every 8 weeks.  Last seen by Dr. Ernst Bowler 09/05/2022 and noted to be doing well on current medications.  Patient reports negative sleep study in the past, however, does state that she snores and sleeps elevated on pillows.  History of C3-7 cervical fusion.  Preop labs reviewed, unremarkable.  EKG 09/11/2022: Sinus rhythm.  Rate 70.  CT abdomen pelvis 09/11/2022: IMPRESSION: 1. 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 There are no  inflammatory changes or fluid in the hernia sac. 2. Constipation and diverticulosis. 3. Cystitis versus bladder  nondistention. 4. Aortic atherosclerosis. 5. Emphysema. 6. Chronic postcholecystectomy extrahepatic biliary dilatation. 7. Additional chronic findings.  TTE 03/28/2021: 1. Left ventricular ejection fraction, by estimation, is 60 to 65%. The  left ventricle has normal function. The left ventricle has no regional  wall motion abnormalities. Left ventricular diastolic parameters are  consistent with Grade I diastolic  dysfunction (impaired relaxation).  2. Right ventricular systolic function is normal. The right ventricular  size is normal. Tricuspid regurgitation signal is inadequate for assessing  PA pressure.  3. The mitral valve is normal in structure. No evidence of mitral valve  regurgitation. No evidence of mitral stenosis.  4. The aortic valve is tricuspid. Aortic valve regurgitation is not  visualized. No aortic stenosis is present.  5. The inferior vena cava is normal in size with greater than 50%  respiratory variability, suggesting right atrial pressure of 3 mmHg.   Coronary CTA 12/02/2016: IMPRESSION: 1. Coronary calcium score of 0. This was 0 percentile for age and sex matched control.  2. Normal coronary origin. Right dominance.  3. No evidence of CAD. Consider non-cardiac origin of chest pain.  Nuclear stress 09/28/2014: Impression Exercise Capacity: Lexiscan with no exercise. BP Response: Normal blood pressure response. Clinical Symptoms: No significant symptoms noted. ECG Impression: No significant ECG changes with Lexiscan. Comparison with Prior Nuclear Study: No previous nuclear study performed  Overall Impression: Normal stress nuclear study.TID is abnormal at 1.37.  LV Wall Motion: NL LV Function; NL Wall Motion; LVEF 66%.  )        Anesthesia Quick Evaluation

## 2022-11-08 NOTE — Progress Notes (Signed)
Anesthesia Chart Review:  With cardiology for history of atypical chest pain with multiple negative cardiac evaluations (Negative Myoview stress test, normal cardiac CTA).  Seen by Ambrose Pancoast, NP on 10/24/2022 for preop evaluation.  Per note, "The patient affirms she has been doing well without any new cardiac symptoms. They are able to achieve 4 METS without cardiac limitations. Therefore, based on ACC/AHA guidelines, the patient would be at acceptable risk for the planned procedure without further cardiovascular testing. The patient was advised that if she develops new symptoms prior to surgery to contact our office to arrange for a follow-up visit, and she verbalized understanding.  Ms. Duford's perioperative risk of a major cardiac event is 0.9% according to the Revised Cardiac Risk Index (RCRI).  Therefore, she is at low risk for perioperative complications.   Her functional capacity is fair at 4.31 METs according to the Duke Activity Status Index (DASI). Recommendations: According to ACC/AHA guidelines, no further cardiovascular testing needed.  The patient may proceed to surgery at acceptable risk."  Follows with allergy/asthma for history of moderate persistent asthma with COPD overlap.  She continues to smoke.  She is maintained on [place] and Singulair daily, Xopenex as needed, Skyland every 8 weeks.  Last seen by Dr. Ernst Bowler 09/05/2022 and noted to be doing well on current medications.  Patient reports negative sleep study in the past, however, does state that she snores and sleeps elevated on pillows.  History of C3-7 cervical fusion.  Preop labs reviewed, unremarkable.  EKG 09/11/2022: Sinus rhythm.  Rate 70.  CT abdomen pelvis 09/11/2022: IMPRESSION: 1. 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 There are no inflammatory changes or fluid in the hernia  sac. 2. Constipation and diverticulosis. 3. Cystitis versus bladder nondistention. 4. Aortic atherosclerosis. 5. Emphysema. 6. Chronic postcholecystectomy extrahepatic biliary dilatation. 7. Additional chronic findings.  TTE 03/28/2021:  1. Left ventricular ejection fraction, by estimation, is 60 to 65%. The  left ventricle has normal function. The left ventricle has no regional  wall motion abnormalities. Left ventricular diastolic parameters are  consistent with Grade I diastolic  dysfunction (impaired relaxation).   2. Right ventricular systolic function is normal. The right ventricular  size is normal. Tricuspid regurgitation signal is inadequate for assessing  PA pressure.   3. The mitral valve is normal in structure. No evidence of mitral valve  regurgitation. No evidence of mitral stenosis.   4. The aortic valve is tricuspid. Aortic valve regurgitation is not  visualized. No aortic stenosis is present.   5. The inferior vena cava is normal in size with greater than 50%  respiratory variability, suggesting right atrial pressure of 3 mmHg.    Coronary CTA 12/02/2016: IMPRESSION: 1. Coronary calcium score of 0. This was 0 percentile for age and sex matched control.   2. Normal coronary origin.  Right dominance.   3. No evidence of CAD.  Consider non-cardiac origin of chest pain.   Nuclear stress 09/28/2014: Impression Exercise Capacity:  Lexiscan with no exercise. BP Response:  Normal blood pressure response. Clinical Symptoms:  No significant symptoms noted. ECG Impression:  No significant ECG changes with Lexiscan. Comparison with Prior Nuclear Study: No previous nuclear study performed   Overall Impression:  Normal stress nuclear study. TID is abnormal at 1.37.   LV Wall Motion:  NL LV Function; NL Wall Motion; LVEF 66%.    Karoline Caldwell, PA-C [facility]/Anesthesiology Phone [phone] 11/08/2022 12:20 PM

## 2022-11-11 NOTE — H&P (Signed)
Chief Complaint: New Consultation (Hernia )       History of Present Illness: Lori [name] is a 67 y.o. female who is seen today as an office consultation at the request of Dr. Room for evaluation of New Consultation (Hernia ) .     Patient is a 67 year old female, with a history of hyperlipidemia, tobacco abuse, COPD, gastroparesis history of MI, who comes in secondary to a more gagging hernia.   Patient states that she has had pain in her anterior chest and abdomen.  She states this is fairly frequent.  Patient underwent workup with a CT scan.  Patient was found to have a Morgagni  hernia with transverse colon up in the chest compartment.  She denies any nausea vomiting or diarrhea.   Patient states she has had previous C-section in the past.   Patient states that she sees Dr. Ellyn Hack as her cardiologist.  She does state that she had a previous MI in the past.  She states that she has nitroglycerin.     Review of Systems: A complete review of systems was obtained from the patient.  I have reviewed this information and discussed as appropriate with the patient.  See HPI as well for other ROS.   Review of Systems  Constitutional:  Negative for fever.  HENT:  Negative for congestion.   Eyes:  Negative for blurred vision.  Respiratory:  Negative for cough, shortness of breath and wheezing.   Cardiovascular:  Negative for chest pain and palpitations.  Gastrointestinal:  Positive for abdominal pain. Negative for heartburn.  Genitourinary:  Negative for dysuria.  Musculoskeletal:  Negative for myalgias.  Skin:  Negative for rash.  Neurological:  Negative for dizziness and headaches.  Psychiatric/Behavioral:  Negative for depression and suicidal ideas.   All other systems reviewed and are negative.       Medical History: Past Medical History         Past Medical History:  Diagnosis Date   Anxiety     Arthritis     Asthma, unspecified asthma severity, unspecified whether  complicated, unspecified whether persistent     COPD (chronic obstructive pulmonary disease) (CMS-HCC)     GERD (gastroesophageal reflux disease)     Sleep apnea          There is no problem list on file for this patient.     Past Surgical History           Past Surgical History:  Procedure Laterality Date   Hand Surgery  Right 11/28/2021    Thumb        Allergies           Allergies  Allergen Reactions   Codeine Nausea and Nausea And Vomiting      CAUSES ULCERS   Ibuprofen Nausea And Vomiting   Venom-Honey Bee Swelling   Hydrocodone-Acetaminophen Nausea And Vomiting   Latex Rash   Levofloxacin Nausea And Vomiting   Propoxyphene N-Acetaminophen Nausea And Vomiting                   Current Outpatient Medications on File Prior to Visit  Medication Sig Dispense Refill   albuterol 90 mcg/actuation inhaler Inhale into the lungs       dexlansoprazole (DEXILANT) 60 mg DR capsule Take by mouth       montelukast (SINGULAIR) 10 mg tablet Take 10 mg by mouth at bedtime       nitroGLYcerin (NITROSTAT) 0.4 MG SL tablet Place 1 tablet  under the tongue every 5 (five) minutes as needed        No current facility-administered medications on file prior to visit.      Family History  History reviewed. No pertinent family history.      Social History           Tobacco Use  Smoking Status Every Day   Packs/day: .5   Types: Cigarettes  Smokeless Tobacco Never      Social History  Social History             Socioeconomic History   Marital status: Widowed  Tobacco Use   Smoking status: Every Day      Packs/day: .5      Types: Cigarettes   Smokeless tobacco: Never  Substance and Sexual Activity   Alcohol use: Not Currently   Drug use: Never        Objective:           Vitals:    09/26/22 0848  BP: (!) 142/82  Pulse: 77  Temp: 36.2 C (97.1 F)  SpO2: 96%  Weight: 53.1 kg (117 lb)  Height: 162.6 cm ('5\' 4"'$ )    Body mass index is 20.08 kg/m.   Physical  Exam Constitutional:      Appearance: Normal appearance.  HENT:     Head: Normocephalic and atraumatic.     Mouth/Throat:     Mouth: Mucous membranes are moist.     Pharynx: Oropharynx is clear.  Eyes:     General: No scleral icterus.    Pupils: Pupils are equal, round, and reactive to light.  Cardiovascular:     Rate and Rhythm: Normal rate and regular rhythm.     Pulses: Normal pulses.     Heart sounds: No murmur heard.    No friction rub. No gallop.  Pulmonary:     Effort: Pulmonary effort is normal. No respiratory distress.     Breath sounds: Normal breath sounds. No stridor.  Abdominal:     General: Abdomen is flat.  Musculoskeletal:        General: No swelling.  Skin:    General: Skin is warm.  Neurological:     General: No focal deficit present.     Mental Status: She is alert and oriented to person, place, and time. Mental status is at baseline.  Psychiatric:        Mood and Affect: Mood normal.        Thought Content: Thought content normal.        Judgment: Judgment normal.            Assessment and Plan:  Diagnoses and all orders for this visit:   Morgagni hernia     Lori [name] is a 67 y.o. female    Patient with a small organic hernia on CT scan.  There is a significant mount of colon within the chest cavity. Will have her evaluated by cardiology prior to scheduling surgery.  I did discuss with her once we get that clearance from cardiology we will have our schedulers reach out to her.    We will proceed to the OR for a robotic/laparoscopic diaphragmatic hernia repair with mesh All risks and benefits were discussed with the patient, to generally include infection, bleeding, damage to surrounding structures, possible pneumothorax, and recurrence. Alternatives were offered and described.  All questions were answered and the patient voiced understanding of the procedure and wishes to proceed at this point.  I did  discuss with her secondary to her smoking  history she may have a higher chance of recurrence.  She understands this risk.

## 2022-11-12 ENCOUNTER — Ambulatory Visit (HOSPITAL_COMMUNITY)
Admission: RE | Admit: 2022-11-12 | Discharge: 2022-11-12 | Disposition: A | Discharge status: Home / Self Care | Payer: 59 | Source: Ambulatory Visit | Attending: General Surgery | Admitting: General Surgery

## 2022-11-12 ENCOUNTER — Ambulatory Visit (HOSPITAL_BASED_OUTPATIENT_CLINIC_OR_DEPARTMENT_OTHER): Payer: 59 | Admitting: Anesthesiology

## 2022-11-12 ENCOUNTER — Encounter (HOSPITAL_COMMUNITY)
Admission: RE | Disposition: A | Discharge status: Home / Self Care | Payer: Self-pay | Source: Ambulatory Visit | Attending: General Surgery

## 2022-11-12 ENCOUNTER — Ambulatory Visit (HOSPITAL_COMMUNITY): Payer: 59 | Admitting: Physician Assistant

## 2022-11-12 ENCOUNTER — Encounter (HOSPITAL_COMMUNITY): Payer: Self-pay | Admitting: General Surgery

## 2022-11-12 ENCOUNTER — Other Ambulatory Visit: Payer: Self-pay

## 2022-11-12 DIAGNOSIS — M1990 Unspecified osteoarthritis, unspecified site: Secondary | ICD-10-CM | POA: Diagnosis not present

## 2022-11-12 DIAGNOSIS — F17210 Nicotine dependence, cigarettes, uncomplicated: Secondary | ICD-10-CM | POA: Insufficient documentation

## 2022-11-12 DIAGNOSIS — J4489 Other specified chronic obstructive pulmonary disease: Secondary | ICD-10-CM | POA: Insufficient documentation

## 2022-11-12 DIAGNOSIS — K449 Diaphragmatic hernia without obstruction or gangrene: Secondary | ICD-10-CM | POA: Diagnosis not present

## 2022-11-12 DIAGNOSIS — E785 Hyperlipidemia, unspecified: Secondary | ICD-10-CM | POA: Insufficient documentation

## 2022-11-12 DIAGNOSIS — K219 Gastro-esophageal reflux disease without esophagitis: Secondary | ICD-10-CM | POA: Diagnosis not present

## 2022-11-12 DIAGNOSIS — K440 Diaphragmatic hernia with obstruction, without gangrene: Secondary | ICD-10-CM | POA: Diagnosis not present

## 2022-11-12 DIAGNOSIS — K3184 Gastroparesis: Secondary | ICD-10-CM | POA: Diagnosis not present

## 2022-11-12 DIAGNOSIS — Q790 Congenital diaphragmatic hernia: Secondary | ICD-10-CM | POA: Diagnosis not present

## 2022-11-12 DIAGNOSIS — Z79899 Other long term (current) drug therapy: Secondary | ICD-10-CM | POA: Diagnosis not present

## 2022-11-12 DIAGNOSIS — M797 Fibromyalgia: Secondary | ICD-10-CM | POA: Diagnosis not present

## 2022-11-12 DIAGNOSIS — I252 Old myocardial infarction: Secondary | ICD-10-CM | POA: Diagnosis not present

## 2022-11-12 HISTORY — PX: INSERTION OF MESH: SHX5868

## 2022-11-12 HISTORY — PX: XI ROBOTIC ASSISTED PARAESOPHAGEAL HERNIA REPAIR: SHX6871

## 2022-11-12 SURGERY — REPAIR, HERNIA, PARAESOPHAGEAL, ROBOT-ASSISTED
Anesthesia: General | Site: Abdomen

## 2022-11-12 MED ORDER — STERILE WATER FOR IRRIGATION IR SOLN
Status: DC | PRN
  Administered 2022-11-12: 1000 mL

## 2022-11-12 MED ORDER — ONDANSETRON HCL 4 MG/2ML IJ SOLN
INTRAMUSCULAR | Status: AC
  Filled 2022-11-12: qty 2

## 2022-11-12 MED ORDER — ONDANSETRON HCL 4 MG/2ML IJ SOLN
INTRAMUSCULAR | Status: DC | PRN
  Administered 2022-11-12: 4 mg via INTRAVENOUS

## 2022-11-12 MED ORDER — BUPIVACAINE LIPOSOME 1.3 % IJ SUSP
INTRAMUSCULAR | Status: AC
  Filled 2022-11-12: qty 20

## 2022-11-12 MED ORDER — ORAL CARE MOUTH RINSE: 15.0000 mL | Freq: Once | OROMUCOSAL | Status: AC

## 2022-11-12 MED ORDER — DEXAMETHASONE SODIUM PHOSPHATE 10 MG/ML IJ SOLN
INTRAMUSCULAR | Status: DC | PRN
  Administered 2022-11-12: 8 mg via INTRAVENOUS

## 2022-11-12 MED ORDER — KETOROLAC TROMETHAMINE 30 MG/ML IJ SOLN
INTRAMUSCULAR | Status: DC | PRN
  Administered 2022-11-12: 30 mg via INTRAVENOUS

## 2022-11-12 MED ORDER — OXYCODONE HCL 5 MG PO TABS: 5.0000 mg | ORAL_TABLET | Freq: Three times a day (TID) | ORAL | 0 refills | Status: DC | PRN

## 2022-11-12 MED ORDER — SODIUM CHLORIDE (PF) 0.9 % IJ SOLN
INTRAMUSCULAR | Status: DC | PRN
  Administered 2022-11-12: 40 mL

## 2022-11-12 MED ORDER — LIDOCAINE 2% (20 MG/ML) 5 ML SYRINGE
INTRAMUSCULAR | Status: DC | PRN
  Administered 2022-11-12: 60 mg via INTRAVENOUS

## 2022-11-12 MED ORDER — CHLORHEXIDINE GLUCONATE CLOTH 2 % EX PADS: 6.0000 | MEDICATED_PAD | Freq: Once | CUTANEOUS | Status: DC

## 2022-11-12 MED ORDER — HYDROMORPHONE HCL 1 MG/ML IJ SOLN
INTRAMUSCULAR | Status: AC
  Filled 2022-11-12: qty 1

## 2022-11-12 MED ORDER — SUCCINYLCHOLINE CHLORIDE 200 MG/10ML IV SOSY
PREFILLED_SYRINGE | INTRAVENOUS | Status: DC | PRN
  Administered 2022-11-12: 80 mg via INTRAVENOUS

## 2022-11-12 MED ORDER — 0.9 % SODIUM CHLORIDE (POUR BTL) OPTIME
TOPICAL | Status: DC | PRN
  Administered 2022-11-12: 1000 mL

## 2022-11-12 MED ORDER — BUPIVACAINE HCL (PF) 0.25 % IJ SOLN
INTRAMUSCULAR | Status: AC
  Filled 2022-11-12: qty 30

## 2022-11-12 MED ORDER — PROPOFOL 10 MG/ML IV BOLUS
INTRAVENOUS | Status: AC
  Filled 2022-11-12: qty 20

## 2022-11-12 MED ORDER — MIDAZOLAM HCL 2 MG/2ML IJ SOLN
INTRAMUSCULAR | Status: AC
  Filled 2022-11-12: qty 2

## 2022-11-12 MED ORDER — DEXAMETHASONE SODIUM PHOSPHATE 10 MG/ML IJ SOLN
INTRAMUSCULAR | Status: AC
  Filled 2022-11-12: qty 1

## 2022-11-12 MED ORDER — FENTANYL CITRATE (PF) 250 MCG/5ML IJ SOLN
INTRAMUSCULAR | Status: DC | PRN
  Administered 2022-11-12: 100 ug via INTRAVENOUS
  Administered 2022-11-12 (×3): 50 ug via INTRAVENOUS

## 2022-11-12 MED ORDER — HYDROMORPHONE HCL 1 MG/ML IJ SOLN
0.2500 mg | INTRAMUSCULAR | Status: DC | PRN
  Administered 2022-11-12 (×4): 0.5 mg via INTRAVENOUS

## 2022-11-12 MED ORDER — CHLORHEXIDINE GLUCONATE 0.12 % MT SOLN
15.0000 mL | Freq: Once | OROMUCOSAL | Status: AC
  Administered 2022-11-12: 15 mL via OROMUCOSAL
  Filled 2022-11-12: qty 15

## 2022-11-12 MED ORDER — FENTANYL CITRATE (PF) 250 MCG/5ML IJ SOLN
INTRAMUSCULAR | Status: AC
  Filled 2022-11-12: qty 5

## 2022-11-12 MED ORDER — CEFAZOLIN SODIUM-DEXTROSE 2-4 GM/100ML-% IV SOLN
2.0000 g | INTRAVENOUS | Status: AC
  Administered 2022-11-12: 2 g via INTRAVENOUS
  Filled 2022-11-12: qty 100

## 2022-11-12 MED ORDER — MIDAZOLAM HCL 2 MG/2ML IJ SOLN
INTRAMUSCULAR | Status: DC | PRN
  Administered 2022-11-12: 2 mg via INTRAVENOUS

## 2022-11-12 MED ORDER — PROPOFOL 10 MG/ML IV BOLUS
INTRAVENOUS | Status: DC | PRN
  Administered 2022-11-12: 100 mg via INTRAVENOUS

## 2022-11-12 MED ORDER — LACTATED RINGERS IV SOLN: INTRAVENOUS | Status: DC | PRN

## 2022-11-12 MED ORDER — PHENYLEPHRINE HCL-NACL 20-0.9 MG/250ML-% IV SOLN
INTRAVENOUS | Status: DC | PRN
  Administered 2022-11-12: 25 ug/min via INTRAVENOUS

## 2022-11-12 MED ORDER — ENSURE PRE-SURGERY PO LIQD: 296.0000 mL | Freq: Once | ORAL | Status: DC

## 2022-11-12 MED ORDER — BUPIVACAINE HCL 0.25 % IJ SOLN
INTRAMUSCULAR | Status: DC | PRN
  Administered 2022-11-12: 10 mL

## 2022-11-12 MED ORDER — LACTATED RINGERS IV SOLN: INTRAVENOUS | Status: DC

## 2022-11-12 MED ORDER — PHENYLEPHRINE 80 MCG/ML (10ML) SYRINGE FOR IV PUSH (FOR BLOOD PRESSURE SUPPORT)
PREFILLED_SYRINGE | INTRAVENOUS | Status: DC | PRN
  Administered 2022-11-12: 80 ug via INTRAVENOUS

## 2022-11-12 MED ORDER — ACETAMINOPHEN 500 MG PO TABS
1000.0000 mg | ORAL_TABLET | ORAL | Status: DC
  Filled 2022-11-12: qty 2

## 2022-11-12 MED ORDER — ROCURONIUM BROMIDE 10 MG/ML (PF) SYRINGE
PREFILLED_SYRINGE | INTRAVENOUS | Status: DC | PRN
  Administered 2022-11-12: 70 mg via INTRAVENOUS

## 2022-11-12 SURGICAL SUPPLY — 62 items
ADH SKN CLS APL DERMABOND .7 (GAUZE/BANDAGES/DRESSINGS) ×1
APL PRP STRL LF DISP 70% ISPRP (MISCELLANEOUS) ×1
CANNULA REDUC XI 12-8 STAPL (CANNULA)
CANNULA REDUCER 12-8 DVNC XI (CANNULA) ×1 IMPLANT
CHLORAPREP W/TINT 26 (MISCELLANEOUS) ×1 IMPLANT
COVER MAYO STAND STRL (DRAPES) ×1 IMPLANT
COVER SURGICAL LIGHT HANDLE (MISCELLANEOUS) ×1 IMPLANT
COVER TIP SHEARS 8 DVNC (MISCELLANEOUS) IMPLANT
COVER TIP SHEARS 8MM DA VINCI (MISCELLANEOUS) ×1
DEFOGGER SCOPE WARMER CLEARIFY (MISCELLANEOUS) ×1 IMPLANT
DERMABOND ADVANCED .7 DNX12 (GAUZE/BANDAGES/DRESSINGS) ×1 IMPLANT
DEVICE TROCAR PUNCTURE CLOSURE (ENDOMECHANICALS) ×1 IMPLANT
DRAPE ARM DVNC X/XI (DISPOSABLE) ×4 IMPLANT
DRAPE CARDIOVASC SPLIT 88X140 (DRAPES) ×1 IMPLANT
DRAPE COLUMN DVNC XI (DISPOSABLE) ×1 IMPLANT
DRAPE DA VINCI XI ARM (DISPOSABLE) ×4
DRAPE DA VINCI XI COLUMN (DISPOSABLE) ×1
DRAPE ORTHO SPLIT 77X108 STRL (DRAPES) ×1
DRAPE SURG ORHT 6 SPLT 77X108 (DRAPES) ×1 IMPLANT
ELECT REM PT RETURN 9FT ADLT (ELECTROSURGICAL) ×1
ELECTRODE REM PT RTRN 9FT ADLT (ELECTROSURGICAL) ×1 IMPLANT
GLOVE BIOGEL PI IND STRL 6.5 (GLOVE) IMPLANT
GLOVE BIOGEL PI IND STRL 7.0 (GLOVE) IMPLANT
GLOVE SURG SS PI 7.0 STRL IVOR (GLOVE) IMPLANT
GLOVE SURG SYN 7.5  E (GLOVE) ×1
GLOVE SURG SYN 7.5 E (GLOVE) ×1 IMPLANT
GLOVE SURG SYN 7.5 PF PI (GLOVE) IMPLANT
GOWN STRL REUS W/ TWL LRG LVL3 (GOWN DISPOSABLE) ×1 IMPLANT
GOWN STRL REUS W/ TWL XL LVL3 (GOWN DISPOSABLE) ×2 IMPLANT
GOWN STRL REUS W/TWL 2XL LVL3 (GOWN DISPOSABLE) ×1 IMPLANT
GOWN STRL REUS W/TWL LRG LVL3 (GOWN DISPOSABLE) ×2
GOWN STRL REUS W/TWL XL LVL3 (GOWN DISPOSABLE) ×1
IRRIG SUCT STRYKERFLOW 2 WTIP (MISCELLANEOUS) ×1
IRRIGATION SUCT STRKRFLW 2 WTP (MISCELLANEOUS) ×1 IMPLANT
KIT BASIN OR (CUSTOM PROCEDURE TRAY) ×1 IMPLANT
KIT TURNOVER KIT B (KITS) IMPLANT
MARKER SKIN DUAL TIP RULER LAB (MISCELLANEOUS) ×1 IMPLANT
MESH PROGRIP LAP SELF FIXATING (Mesh General) ×1 IMPLANT
MESH PROGRIP LAP SLF FIX 16X12 (Mesh General) IMPLANT
NDL 22X1.5 STRL (OR ONLY) (MISCELLANEOUS) ×1 IMPLANT
NDL INSUFFLATION 14GA 120MM (NEEDLE) ×1 IMPLANT
NEEDLE 22X1.5 STRL (OR ONLY) (MISCELLANEOUS) ×1 IMPLANT
NEEDLE INSUFFLATION 14GA 120MM (NEEDLE) ×1 IMPLANT
SEAL CANN UNIV 5-8 DVNC XI (MISCELLANEOUS) ×3 IMPLANT
SEAL XI 5MM-8MM UNIVERSAL (MISCELLANEOUS) ×3
SEALER VESSEL DA VINCI XI (MISCELLANEOUS) ×1
SEALER VESSEL EXT DVNC XI (MISCELLANEOUS) ×1 IMPLANT
SET TUBE SMOKE EVAC HIGH FLOW (TUBING) ×1 IMPLANT
SPIKE FLUID TRANSFER (MISCELLANEOUS) ×1 IMPLANT
STAPLER CANNULA SEAL DVNC XI (STAPLE) ×1 IMPLANT
STAPLER CANNULA SEAL XI (STAPLE) ×1
STOPCOCK 4 WAY LG BORE MALE ST (IV SETS) ×1 IMPLANT
SUT ETHIBOND 0 36 GRN (SUTURE) ×2 IMPLANT
SUT MNCRL AB 4-0 PS2 18 (SUTURE) ×1 IMPLANT
SUT VIC AB 2-0 SH 27 (SUTURE) ×1
SUT VIC AB 2-0 SH 27XBRD (SUTURE) IMPLANT
SUT VICRYL 0 UR6 27IN ABS (SUTURE) ×1 IMPLANT
SUT VLOC 180 0 9IN  GS21 (SUTURE) ×1
SUT VLOC 180 0 9IN GS21 (SUTURE) IMPLANT
SYR 30ML SLIP (SYRINGE) ×1 IMPLANT
TRAY LAPAROSCOPIC MC (CUSTOM PROCEDURE TRAY) ×1 IMPLANT
TROCAR ADV FIXATION 5X100MM (TROCAR) ×1 IMPLANT

## 2022-11-12 NOTE — Anesthesia Procedure Notes (Signed)
Procedure Name: Intubation Date/Time: 11/12/2022 7:39 AM  Performed by: Inda Coke, CRNAPre-anesthesia Checklist: Patient identified, Emergency Drugs available, Suction available, Timeout performed and Patient being monitored Patient Re-evaluated:Patient Re-evaluated prior to induction Oxygen Delivery Method: Circle system utilized Preoxygenation: Pre-oxygenation with 100% oxygen Induction Type: IV induction and Rapid sequence Laryngoscope Size: Mac and 3 Grade View: Grade I Tube type: Oral Tube size: 7.0 mm Number of attempts: 1 Airway Equipment and Method: Stylet Placement Confirmation: ETT inserted through vocal cords under direct vision, positive ETCO2, CO2 detector and breath sounds checked- equal and bilateral Secured at: 22 cm Tube secured with: Tape Dental Injury: Teeth and Oropharynx as per pre-operative assessment

## 2022-11-12 NOTE — Interval H&P Note (Signed)
History and Physical Interval Note:  11/12/2022 7:18 AM  Lori [name]  has presented today for surgery, with the diagnosis of MORGAGNI HERNIA.  The various methods of treatment have been discussed with the patient and family. After consideration of risks, benefits and other options for treatment, the patient has consented to  Procedure(s): ROBOTIC [place] (N/A) as a surgical intervention.  The patient's history has been reviewed, patient examined, no change in status, stable for surgery.  I have reviewed the patient's chart and labs.  Questions were answered to the patient's satisfaction.     [name]

## 2022-11-12 NOTE — Discharge Instructions (Signed)
[facility] _______[facility]  HERNIA REPAIR: POST OP INSTRUCTIONS  Always review your discharge instruction sheet given to you by the facility where your surgery was performed. IF YOU HAVE DISABILITY OR FAMILY LEAVE FORMS, YOU MUST BRING THEM TO THE OFFICE FOR PROCESSING.   DO NOT GIVE THEM TO YOUR DOCTOR.  1. A  prescription for pain medication may be given to you upon discharge.  Take your pain medication as prescribed, if needed.  If narcotic pain medicine is not needed, then you may take acetaminophen (Tylenol) or ibuprofen (Advil) as needed. 2. Take your usually prescribed medications unless otherwise directed. If you need a refill on your pain medication, please contact your pharmacy.  They will contact our office to request authorization. Prescriptions will not be filled after 5 pm or on week-ends. 3. You should follow a light diet the first 24 hours after arrival home, such as soup and crackers, etc.  Be sure to include lots of fluids daily.  Resume your normal diet the day after surgery. 4.Most patients will experience some swelling and bruising around the umbilicus or in the groin and scrotum.  Ice packs and reclining will help.  Swelling and bruising can take several days to resolve.  6. It is common to experience some constipation if taking pain medication after surgery.  Increasing fluid intake and taking a stool softener (such as Colace) will usually help or prevent this problem from occurring.  A mild laxative (Milk of Magnesia or Miralax) should be taken according to package directions if there are no bowel movements after 48 hours. 7. Unless discharge instructions indicate otherwise, you may remove your bandages 24-48 hours after surgery, and you may shower at that time.  You may have steri-strips (small skin tapes) in place directly over the incision.  These strips should be left on the skin for 7-10 days.  If your surgeon used skin glue on the incision, you may shower in  24 hours.  The glue will flake off over the next 2-3 weeks.  Any sutures or staples will be removed at the office during your follow-up visit. 8. ACTIVITIES:  You may resume regular (light) daily activities beginning the next day--such as daily self-care, walking, climbing stairs--gradually increasing activities as tolerated.  You may have sexual intercourse when it is comfortable.  Refrain from any heavy lifting or straining until approved by your doctor.  a.You may drive when you are no longer taking prescription pain medication, you can comfortably wear a seatbelt, and you can safely maneuver your car and apply brakes. b.RETURN TO WORK:   _____________________________________________  9.You should see your doctor in the office for a follow-up appointment approximately 2-3 weeks after your surgery.  Make sure that you call for this appointment within a day or two after you arrive home to insure a convenient appointment time. 10.OTHER INSTRUCTIONS: _________________________    _____________________________________  WHEN TO CALL YOUR DOCTOR: Fever over 101.0 Inability to urinate Nausea and/or vomiting Extreme swelling or bruising Continued bleeding from incision. Increased pain, redness, or drainage from the incision  The clinic staff is available to answer your questions during regular business hours.  Please don't hesitate to call and ask to speak to one of the nurses for clinical concerns.  If you have a medical emergency, go to the nearest emergency room or call 911.  A surgeon from [facility] is always on call at the hospital   [address], [address], [place],     [address] ?  [address] [address], [place], [place]   [address] [phone] ? [phone] ? FAX [phone] Web site: [email or web address]  

## 2022-11-12 NOTE — Op Note (Signed)
11/12/2022  8:59 AM  PATIENT:  Lori [name]  67 y.o. female  PRE-OPERATIVE DIAGNOSIS: Incarcerated MORGAGNI HERNIA of DIAPHRAGM   POST-OPERATIVE DIAGNOSIS: Incarcerated MORGAGNI HERNIA of DIAPHRAGM   PROCEDURE:  Procedure(s): ROBOTIC DIAPHRAGMATIC HERNIA REPAIR WITH MESH (N/A)  TAPP APPROACH INSERTION OF MESH (N/A)  SURGEON:  Surgeon(s) and Role:    * [name], MD - Primary  ASSISTANTS: Celene Squibb, RNFA   ANESTHESIA:   local and general  EBL:  minimal   BLOOD ADMINISTERED:none  DRAINS: none   LOCAL MEDICATIONS USED:  BUPIVICAINE  and OTHER exparel  SPECIMEN:  No Specimen  DISPOSITION OF SPECIMEN:  N/A  COUNTS:  YES  TOURNIQUET:  * No tourniquets in log *  DICTATION: .Dragon Dictation Indication procedure: Patient is a 67 year old female with a history of a more gagging hernia of the diaphragm.  Patient had incarcerated colon within the hernia.  Patient was counseled and decided to have this electively repaired.  Findings: Patient with a approximate 3 and half centimeter hernia of the diaphragm.  Patient also had incarcerated colon within the hernia sac.  This was reduced in its entirety.  The hernia was then reapproximated using interrupted horizontal mattress sutures of 0 Ethibond's.  A piece of 12 x 17 cm ProGrip laparoscopic mesh was placed into the preperitoneal space to cover the hernia defect that was primarily repaired.  Details of procedure: After the patient was consented patient takeback to the OR and placed supine position with bilateral SCDs in place.  She underwent general and trach intubation.  Patient was then prepped and draped in standard fashion.  A timeout was called and all facts verified.  At this time Veress needle technique was used insufflate the abdomen Palmer's point.  Subsequent to this an 8 mm trocar and the robotic camera then placed intra-abdominal he.  The subsequent 8 mm trocars x 2 were then placed in the supraumbilical  midline as well as the right upper quadrant area under direct visualization.  At this time the robot was docked to the patient.  At this time the patient position.  Laparoscopically I was able to reduce the incarcerated colon that was in the more gagging hernia in the chest.  This was reduced in its entirety.  At this time I proceeded to take down the peritoneum of the anterior abdominal wall.  This was done with cautery to maintain hemostasis.  I created an area in the preperitoneal space and dissected the hernia sac out of the hernia.  I was able to clear off the peritoneum from the inferior portion of the diaphragm towards the central tendon.  This was done in a wide fashion to help the mesh lay flat in its entirety.  Once the dissection of the peritoneum was done.  0 Ethibond were then used in a horizontal mattress suture in interrupted fashion to reapproximate the diaphragm up to the anterior chest wall.  This approximated the diaphragm well.  At this time a piece of 12 x 17 cm ProGrip laparoscopic mesh was then placed into the abdomen.  This was brought up and cover the hernia pair widely.  It should be noted that the original hernia size was approximate 3 and half centimeters.  Once is lay flat the peritoneum was then closed using a running 9 inch V-Loc.  At this time the robot was undocked.  The trocars in the midline were removed and the fascia was approximated using a 0 Vicryl and Endo Close device.  Insufflation  was evacuated.  All trocars removed.  Skin was reapproximated using 4 Monocryl subcuticular fashion.  The skin was dressed with Dermabond.  Patient tolerated the  procedure well was taken to the recovery room stable condition.   PLAN OF CARE: Discharge to home after PACU  PATIENT DISPOSITION:  PACU - hemodynamically stable.   Delay start of Pharmacological VTE agent (>24hrs) due to surgical blood loss or risk of bleeding: not applicable

## 2022-11-12 NOTE — Anesthesia Postprocedure Evaluation (Signed)
Anesthesia Post Note  Patient: Lori [name]  Procedure(s) Performed: ROBOTIC DIAPHRAGMATIC HERNIA REPAIR WITH MESH (Abdomen) INSERTION OF MESH (Abdomen)     Patient location during evaluation: PACU Anesthesia Type: General Level of consciousness: awake and alert Pain management: pain level controlled Vital Signs Assessment: post-procedure vital signs reviewed and stable Respiratory status: spontaneous breathing, nonlabored ventilation and respiratory function stable Cardiovascular status: blood pressure returned to baseline and stable Postop Assessment: no apparent nausea or vomiting Anesthetic complications: no  No notable events documented.  Last Vitals:  Vitals:   11/12/22 1042 11/12/22 1044  BP: 118/67   Pulse: 75 75  Resp: 13 17  Temp:  37.1 C  SpO2: 96% 95%    Last Pain:  Vitals:   11/12/22 1030  TempSrc:   PainSc: 9                  Othon Guardia,W. EDMOND

## 2022-11-12 NOTE — Transfer of Care (Signed)
Immediate Anesthesia Transfer of Care Note  Patient: Lori [name]  Procedure(s) Performed: ROBOTIC DIAPHRAGMATIC HERNIA REPAIR WITH MESH (Abdomen) INSERTION OF MESH (Abdomen)  Patient Location: PACU  Anesthesia Type:General  Level of Consciousness: awake, alert , and oriented  Airway & Oxygen Therapy: Patient Spontanous Breathing and Patient connected to nasal cannula oxygen  Post-op Assessment: Report given to RN and Post -op Vital signs reviewed and stable  Post vital signs: Reviewed and stable  Last Vitals:  Vitals Value Taken Time  BP    Temp    Pulse    Resp    SpO2      Last Pain:  Vitals:   11/12/22 0620  TempSrc: Oral  PainSc: 10-Worst pain ever      Patients Stated Pain Goal: 3 ([id number])  Complications: No notable events documented.

## 2022-11-13 ENCOUNTER — Encounter (HOSPITAL_COMMUNITY): Payer: Self-pay | Admitting: General Surgery

## 2022-11-14 ENCOUNTER — Ambulatory Visit: Payer: Medicare Other

## 2022-11-15 ENCOUNTER — Ambulatory Visit (INDEPENDENT_AMBULATORY_CARE_PROVIDER_SITE_OTHER): Payer: Medicare HMO | Admitting: *Deleted

## 2022-11-15 DIAGNOSIS — J4550 Severe persistent asthma, uncomplicated: Secondary | ICD-10-CM

## 2022-12-04 ENCOUNTER — Ambulatory Visit (INDEPENDENT_AMBULATORY_CARE_PROVIDER_SITE_OTHER): Payer: Medicare HMO

## 2022-12-04 DIAGNOSIS — J309 Allergic rhinitis, unspecified: Secondary | ICD-10-CM | POA: Diagnosis not present

## 2022-12-05 ENCOUNTER — Telehealth: Payer: Self-pay | Admitting: Internal Medicine

## 2022-12-05 NOTE — Telephone Encounter (Signed)
Pt called she is having diarrhea and she can't get it to stop. Pt was asking if someone could prescribe her something.  Please give pt a call with update.

## 2022-12-05 NOTE — Telephone Encounter (Signed)
Needs OV to determine most appropriate tx

## 2022-12-06 DIAGNOSIS — G43719 Chronic migraine without aura, intractable, without status migrainosus: Secondary | ICD-10-CM | POA: Diagnosis not present

## 2022-12-06 NOTE — Telephone Encounter (Signed)
Called pt no answer LMOM w/MD response../lmb 

## 2022-12-10 ENCOUNTER — Other Ambulatory Visit: Payer: Self-pay | Admitting: Internal Medicine

## 2022-12-16 ENCOUNTER — Telehealth: Payer: Self-pay | Admitting: Internal Medicine

## 2022-12-16 MED ORDER — ALPRAZOLAM 1 MG PO TABS: ORAL_TABLET | ORAL | 1 refills | Status: DC

## 2022-12-16 NOTE — Telephone Encounter (Signed)
Done erx 

## 2022-12-16 NOTE — Telephone Encounter (Signed)
Prescription Request  12/16/2022  LOV: 09/19/2022  What is the name of the medication or equipment?  ALPRAZolam Duanne Moron) 1 MG tablet   Have you contacted your pharmacy to request a refill? Yes   Which pharmacy would you like this sent to?  [facility], [place] [address] Phone: [phone] Fax: [phone]    Patient notified that their request is being sent to the clinical staff for review and that they should receive a response within 2 business days.   Please advise at [facility] [phone]

## 2022-12-24 ENCOUNTER — Other Ambulatory Visit: Payer: 59

## 2022-12-27 ENCOUNTER — Ambulatory Visit: Payer: Medicare Other | Admitting: Internal Medicine

## 2022-12-27 ENCOUNTER — Other Ambulatory Visit: Payer: Self-pay

## 2022-12-27 MED ORDER — ALBUTEROL SULFATE HFA 108 (90 BASE) MCG/ACT IN AERS: 2.0000 | INHALATION_SPRAY | Freq: Four times a day (QID) | RESPIRATORY_TRACT | 2 refills | Status: DC | PRN

## 2023-01-01 ENCOUNTER — Ambulatory Visit (INDEPENDENT_AMBULATORY_CARE_PROVIDER_SITE_OTHER): Payer: 59 | Admitting: Internal Medicine

## 2023-01-01 ENCOUNTER — Encounter: Payer: Self-pay | Admitting: Internal Medicine

## 2023-01-01 ENCOUNTER — Ambulatory Visit (INDEPENDENT_AMBULATORY_CARE_PROVIDER_SITE_OTHER): Payer: 59

## 2023-01-01 VITALS — BP 132/80 | HR 62 | Temp 98.2°F | Ht 64.0 in | Wt 117.0 lb

## 2023-01-01 DIAGNOSIS — R1013 Epigastric pain: Secondary | ICD-10-CM

## 2023-01-01 DIAGNOSIS — J4489 Other specified chronic obstructive pulmonary disease: Secondary | ICD-10-CM

## 2023-01-01 DIAGNOSIS — R739 Hyperglycemia, unspecified: Secondary | ICD-10-CM | POA: Diagnosis not present

## 2023-01-01 DIAGNOSIS — E559 Vitamin D deficiency, unspecified: Secondary | ICD-10-CM | POA: Diagnosis not present

## 2023-01-01 DIAGNOSIS — R109 Unspecified abdominal pain: Secondary | ICD-10-CM | POA: Diagnosis not present

## 2023-01-01 DIAGNOSIS — F17210 Nicotine dependence, cigarettes, uncomplicated: Secondary | ICD-10-CM

## 2023-01-01 DIAGNOSIS — R112 Nausea with vomiting, unspecified: Secondary | ICD-10-CM | POA: Diagnosis not present

## 2023-01-01 DIAGNOSIS — J309 Allergic rhinitis, unspecified: Secondary | ICD-10-CM | POA: Diagnosis not present

## 2023-01-01 LAB — LIPASE: Lipase: 9 U/L — ABNORMAL LOW (ref 11.0–59.0)

## 2023-01-01 LAB — URINALYSIS, ROUTINE W REFLEX MICROSCOPIC
Bilirubin Urine: NEGATIVE
Ketones, ur: NEGATIVE
Leukocytes,Ua: NEGATIVE
Nitrite: NEGATIVE
Specific Gravity, Urine: 1.02 (ref 1.000–1.030)
Total Protein, Urine: NEGATIVE
Urine Glucose: NEGATIVE
Urobilinogen, UA: 0.2 (ref 0.0–1.0)
pH: 6 (ref 5.0–8.0)

## 2023-01-01 LAB — CBC WITH DIFFERENTIAL/PLATELET
Basophils Absolute: 0 10*3/uL (ref 0.0–0.1)
Basophils Relative: 0.2 % (ref 0.0–3.0)
Eosinophils Absolute: 0 10*3/uL (ref 0.0–0.7)
Eosinophils Relative: 0 % (ref 0.0–5.0)
HCT: 40.4 % (ref 36.0–46.0)
Hemoglobin: 13.9 g/dL (ref 12.0–15.0)
Lymphocytes Relative: 38.8 % (ref 12.0–46.0)
Lymphs Abs: 3.1 10*3/uL (ref 0.7–4.0)
MCHC: 34.5 g/dL (ref 30.0–36.0)
MCV: 92.5 fl (ref 78.0–100.0)
Monocytes Absolute: 0.6 10*3/uL (ref 0.1–1.0)
Monocytes Relative: 6.8 % (ref 3.0–12.0)
Neutro Abs: 4.4 10*3/uL (ref 1.4–7.7)
Neutrophils Relative %: 54.2 % (ref 43.0–77.0)
Platelets: 296 10*3/uL (ref 150.0–400.0)
RBC: 4.37 Mil/uL (ref 3.87–5.11)
RDW: 14.3 % (ref 11.5–15.5)
WBC: 8.1 10*3/uL (ref 4.0–10.5)

## 2023-01-01 LAB — BASIC METABOLIC PANEL
BUN: 15 mg/dL (ref 6–23)
CO2: 25 mEq/L (ref 19–32)
Calcium: 9.5 mg/dL (ref 8.4–10.5)
Chloride: 104 mEq/L (ref 96–112)
Creatinine, Ser: 1.25 mg/dL — ABNORMAL HIGH (ref 0.40–1.20)
GFR: 44.95 mL/min — ABNORMAL LOW (ref >60.00)
Glucose, Bld: 92 mg/dL (ref 70–99)
Potassium: 3.8 mEq/L (ref 3.5–5.1)
Sodium: 137 mEq/L (ref 135–145)

## 2023-01-01 LAB — HEPATIC FUNCTION PANEL
ALT: 6 U/L (ref 0–35)
AST: 11 U/L (ref 0–37)
Albumin: 4.6 g/dL (ref 3.5–5.2)
Alkaline Phosphatase: 76 U/L (ref 39–117)
Bilirubin, Direct: 0.1 mg/dL (ref 0.0–0.3)
Total Bilirubin: 0.4 mg/dL (ref 0.2–1.2)
Total Protein: 6.9 g/dL (ref 6.0–8.3)

## 2023-01-01 MED ORDER — OXYCODONE HCL 5 MG PO TABS: 5.0000 mg | ORAL_TABLET | Freq: Three times a day (TID) | ORAL | 0 refills | Status: DC | PRN

## 2023-01-01 MED ORDER — PANTOPRAZOLE SODIUM 40 MG PO TBEC: 40.0000 mg | DELAYED_RELEASE_TABLET | Freq: Every day | ORAL | 3 refills | Status: DC

## 2023-01-01 NOTE — Assessment & Plan Note (Signed)
Last vitamin D Lab Results  Component Value Date   VD25OH 22.86 (L) 01/10/2022   Low, reminded to start oral replacement  

## 2023-01-01 NOTE — Assessment & Plan Note (Signed)
Stable , cont inhaler prn 

## 2023-01-01 NOTE — Assessment & Plan Note (Signed)
Etiology unclear, to start protonix 40 qd, hydrocodone prn, acute abd plain films today, labs incuding cbc and lipase, bmp, also stat CT abd.pelvis w CM, and refer GI

## 2023-01-01 NOTE — Patient Instructions (Addendum)
Please take all new medication as prescribed - the pain medication oxycodone, but also the stomach acid medication called Protonix 40 mg per day  Please continue all other medications as before, and refills have been done if requested.  Please have the pharmacy call with any other refills you may need.  Please continue your efforts at being more active, low cholesterol diet, and weight control.  You are otherwise up to date with prevention measures today.  Please keep your appointments with your specialists as you may have planned  You will be contacted regarding the referral for: CT scan, and GI referral  Please go to the XRAY Department in the first floor for the x-ray testing  Please go to the LAB at the blood drawing area for the tests to be done  You will be contacted by phone if any changes need to be made immediately.  Otherwise, you will receive a letter about your results with an explanation, but please check with MyChart first.  Please make an Appointment to return in 3 months, or sooner if needed

## 2023-01-01 NOTE — Progress Notes (Addendum)
Patient ID: Lori [name], female   DOB: July 01, 1956, 67 y.o.   MRN: [id number]        Chief Complaint: follow up epigastric pain with vomiting       HPI:  Lori [name] is a 67 y.o. female here with c/o 6 wks persistent epigastric pain with few episodes of small volume non bloody vomiting, just has not improved  S/p feb 27 incarcerated diaphgramatic hernia repair. Stiill with stinging burning pain, maybe worse in the past wk with less po intake, but without fever, chills, dysphagia, reflux, radiation, other bowel change or blood.   Pt denies polydipsia, polyuria, or new focal neuro s/s.   Pt denies chest pain, increased sob or doe, wheezing, orthopnea, PND, increased LE swelling, palpitations, dizziness or syncope.   Pt denies fever, night sweats, though has some lower appetite as well.      Wt Readings from Last 3 Encounters:  01/01/23 117 lb (53.1 kg)  11/12/22 118 lb 12.8 oz (53.9 kg)  11/07/22 118 lb 12.8 oz (53.9 kg)   BP Readings from Last 3 Encounters:  01/01/23 132/80  11/12/22 118/67  11/07/22 117/64         Past Medical History:  Diagnosis Date   Allergic rhinitis    Anemia    hgb 13.9 on 11/22/21   Ankle fracture 05/2016   Anxiety    Arthritis    Asthma    Barrett esophagus 2007   Chest pain    Hx, no current problems as of 11/22/21.  Recurrent episodes.  Has had 3 negative nuclear stress test, and a completely normal coronary CT angiogram   COPD (chronic obstructive pulmonary disease) with chronic bronchitis    & Emphysema   Depression    Duodenitis without mention of hemorrhage 2007   Esophageal reflux 2007   Esophageal stricture    Full dentures    patient does not wear dentures as of 11/22/21   H/O hiatal hernia    Hiatal hernia 2006,2007   HSV infection    Hyperlipidemia    rx crestor but pt not taking   Multiple fractures    from falls, fx rt. elbow, fx left wrist, bilateral ankles   Pneumonia 10/2016   Restrictive lung disease 06/30/2017   Ulcer    no  current problems as of 11/22/21   Past Surgical History:  Procedure Laterality Date   ABDOMINAL HYSTERECTOMY     BSO   APPENDECTOMY     BIOPSY  04/04/2022   Procedure: BIOPSY;  Surgeon: Lemar Lofty., MD;  Location: [facility] ENDOSCOPY;  Service: Gastroenterology;;   CESAREAN SECTION     x 2   CHOLECYSTECTOMY     CHONDROPLASTY Left 01/06/2015   Procedure: CHONDROPLASTY;  Surgeon: Eldred Manges, MD;  Location: [facility];  Service: Orthopedics;  Laterality: Left;   COLONOSCOPY     CT CTA CORONARY W/CA SCORE W/CM &/OR WO/CM  12/02/2016   CORONARY CALCIUM SCORE 0.  NO EVIDENCE OF CAD.  Normal-sized PA.  Normal pulmonary venous drainage the left atrium.  No ASD or VSD.   ELBOW FRACTURE SURGERY     right   ESOPHAGOGASTRODUODENOSCOPY (EGD) WITH PROPOFOL N/A 04/04/2022   Procedure: ESOPHAGOGASTRODUODENOSCOPY (EGD) WITH PROPOFOL;  Surgeon: Meridee Score Netty Starring., MD;  Location: [facility] ENDOSCOPY;  Service: Gastroenterology;  Laterality: N/A;   EUS N/A 04/04/2022   Procedure: UPPER ENDOSCOPIC ULTRASOUND (EUS) RADIAL;  Surgeon: Lemar Lofty., MD;  Location: [facility] ENDOSCOPY;  Service: Gastroenterology;  Laterality: N/A;   EYE SURGERY Bilateral    cataracts removed   INSERTION OF MESH N/A 11/12/2022   Procedure: INSERTION OF MESH;  Surgeon: Axel Filler, MD;  Location: [facility] OR;  Service: General;  Laterality: N/A;   KNEE ARTHROSCOPY WITH EXCISION PLICA Left 01/06/2015   Procedure: KNEE ARTHROSCOPY WITH EXCISION PLICA;  Surgeon: Eldred Manges, MD;  Location: [facility];  Service: Orthopedics;  Laterality: Left;   LIGAMENT REPAIR Right 11/28/2021   Procedure: RIGHT THUMB LIGAMENT RECONSTRUCTION AND TENDON INTERPOSITION;  Surgeon: Marlyne Beards, MD;  Location: [facility] OR;  Service: Orthopedics;  Laterality: Right;   Lower Extremity Arterial Dopplers  07/06/2013   RABI 1.0, LABI 1.1.; No evidence of significant vascular atherogenic plaque   NECK SURGERY     NM MYOVIEW  LTD  09/2014   a) LOW RISK. Normal EF of 65%, no RWMA. NO ISCHEMIA OR INFARCTION.;; b) 05/02/2009: Normal study.  LOW RISK.  No ischemia or infarction.  EF 74%.  Breast attenuation noted.   TONSILLECTOMY     TRANSTHORACIC ECHOCARDIOGRAM  03/28/2021   a) Normal EF 60 to 65%.  GR 1 DD.  No R WMA.  Normal RV size and function.  Normal RAP.  Normal valves. => NORMAL;; b) 04/23/16/2010: Normal study.  LOW RISK.  No ischemia or infarction.  EF 74%.  Breast attenuation noted.17/2010: ([facility]) EF>55%.  Normal wall motion.  Normal valves.   WRIST FRACTURE SURGERY     left, has plate   XI ROBOTIC ASSISTED PARAESOPHAGEAL HERNIA REPAIR N/A 11/12/2022   Procedure: ROBOTIC DIAPHRAGMATIC HERNIA REPAIR WITH MESH;  Surgeon: Axel Filler, MD;  Location: [facility] OR;  Service: General;  Laterality: N/A;    reports that she has been smoking cigarettes. She started smoking about 45 years ago. She has a 19.50 pack-year smoking history. She has never used smokeless tobacco. She reports that she does not drink alcohol and does not use drugs. family history includes Dementia in her mother; Diabetes in her maternal aunt, maternal grandmother, and another family member; Emphysema in her father; Heart disease in her brother. Allergies  Allergen Reactions   Advil [Ibuprofen] Nausea And Vomiting   Bee Venom Swelling   Codeine Nausea Only and Other (See Comments)    CAUSES ULCERS   Clonidine Derivatives Other (See Comments)    Unknown    Statins Other (See Comments)    unknown   Latex Rash   Levaquin [Levofloxacin] Nausea Only   Propoxyphene N-Acetaminophen Nausea Only   Current Outpatient Medications on File Prior to Visit  Medication Sig Dispense Refill   albuterol (PROAIR HFA) 108 (90 Base) MCG/ACT inhaler Inhale 2 puffs into the lungs every 6 (six) hours as needed for wheezing or shortness of breath. 1 each 2   ALPRAZolam (XANAX) 1 MG tablet TAKE 1 TABLET BY MOUTH AT BEDTIME AS NEEDED FOR ANXIETY 90 tablet 1    Benralizumab (FASENRA) 30 MG/ML SOSY INJECT 30MG  SUBCUTANEOUSLY  EVERY 8 WEEKS 1 mL 6   dexlansoprazole (DEXILANT) 60 MG capsule Take 1 capsule (60 mg total) by mouth daily. 90 capsule 3   EPINEPHrine 0.3 mg/0.3 mL IJ SOAJ injection INJECT AS DIRECTED FOR SEVERE ALLERGIC REACTION 2 each 1   gabapentin (NEURONTIN) 300 MG capsule Take 300 mg by mouth in the morning.     gabapentin (NEURONTIN) 600 MG tablet Take 600 mg by mouth at bedtime.     lidocaine (LIDODERM) 5 % Place 1 patch onto the skin daily. Remove & Discard  patch within 12 hours or as directed by MD 30 patch 0   meclizine (ANTIVERT) 12.5 MG tablet Take 1 tablet (12.5 mg total) by mouth 3 (three) times daily as needed for dizziness. (Patient taking differently: Take 12.5 mg by mouth 3 (three) times daily.) 60 tablet 1   montelukast (SINGULAIR) 10 MG tablet Take 1 tablet (10 mg total) by mouth at bedtime. (Patient taking differently: Take 10 mg by mouth in the morning.) 90 tablet 3   nitroGLYCERIN (NITROSTAT) 0.4 MG SL tablet PLACE 1 TABLET UNDER THE TONGUE EVERY 5 MINUTES AS NEEDED FOR CHEST PAIN 25 tablet 3   PARoxetine (PAXIL) 10 MG tablet Take 10 mg by mouth in the morning.     predniSONE (DELTASONE) 10 MG tablet 3 tabs by mouth per day for 3 days,2tabs per day for 3 days,1tab per day for 3 days 18 tablet 0   tiZANidine (ZANAFLEX) 2 MG tablet Take 1 tablet (2 mg total) by mouth every 8 (eight) hours as needed for muscle spasms. 15 tablet 0   valACYclovir (VALTREX) 1000 MG tablet Take 1 tablet by mouth twice daily 180 tablet 0   Current Facility-Administered Medications on File Prior to Visit  Medication Dose Route Frequency Provider Last Rate Last Admin   Benralizumab SOSY 30 mg  30 mg Subcutaneous Q8 weeks Alfonse Spruce, MD   30 mg at 11/15/22 0907        ROS:  All others reviewed and negative.  Objective        PE:  BP 132/80 (BP Location: Right Arm, Patient Position: Sitting, Cuff Size: Normal)   Pulse 62   Temp 98.2 F  (36.8 C) (Oral)   Ht  (1.626 m)   Wt 117 lb (53.1 kg)   SpO2 96%   BMI 20.08 kg/m                 Constitutional: Pt appears in NAD               HENT: Head: NCAT.                Right Ear: External ear normal.                 Left Ear: External ear normal.                Eyes: . Pupils are equal, round, and reactive to light. Conjunctivae and EOM are normal               Nose: without d/c or deformity               Neck: Neck supple. Gross normal ROM               Cardiovascular: Normal rate and regular rhythm.                 Pulmonary/Chest: Effort normal and breath sounds without rales or wheezing.                Abd:  Soft, ND, + BS, no organomegaly but has mod to severe epigastric and ruq and luq tender to light palpation without guarding or rebound               Neurological: Pt is alert. At baseline orientation, motor grossly intact               Skin: Skin is warm. No rashes, no other new lesions, LE edema - none  Psychiatric: Pt behavior is normal without agitation   Micro: none  Cardiac tracings I have personally interpreted today:  none  Pertinent Radiological findings (summarize): none   Lab Results  Component Value Date   WBC 8.1 01/01/2023   HGB 13.9 01/01/2023   HCT 40.4 01/01/2023   PLT 296.0 01/01/2023   GLUCOSE 92 01/01/2023   CHOL 246 (H) 01/10/2022   TRIG 328.0 (H) 01/10/2022   HDL 41.40 01/10/2022   LDLDIRECT 165.0 01/10/2022   LDLCALC Comment 03/12/2017   ALT 6 01/01/2023   AST 11 01/01/2023   NA 137 01/01/2023   K 3.8 01/01/2023   CL 104 01/01/2023   CREATININE 1.25 (H) 01/01/2023   BUN 15 01/01/2023   CO2 25 01/01/2023   TSH 1.42 01/10/2022   INR 0.9 08/31/2008   HGBA1C 5.3 01/10/2022   Assessment/Plan:  Lori [name] is a 67 y.o. White or Caucasian [1] female with  has a past medical history of Allergic rhinitis, Anemia, Ankle fracture (05/2016), Anxiety, Arthritis, Asthma, Barrett esophagus (2007), Chest pain, COPD  (chronic obstructive pulmonary disease) with chronic bronchitis, Depression, Duodenitis without mention of hemorrhage (2007), Esophageal reflux (2007), Esophageal stricture, Full dentures, H/O hiatal hernia, Hiatal hernia (1478,2956), HSV infection, Hyperlipidemia, Multiple fractures, Pneumonia (10/2016), Restrictive lung disease (06/30/2017), and Ulcer.  Epigastric pain Etiology unclear, to start protonix 40 qd, hydrocodone prn, acute abd plain films today, labs incuding cbc and lipase, bmp, also stat CT abd.pelvis w CM, and refer GI  Hyperglycemia Lab Results  Component Value Date   HGBA1C 5.3 01/10/2022   Stable, pt to continue current medical treatment  - diet, wt control  Vitamin D deficiency Last vitamin D Lab Results  Component Value Date   VD25OH 22.86 (L) 01/10/2022   Low, reminded to start oral replacement   Asthma-COPD overlap syndrome (HCC) Stable, cont inhaler prn  Cigarette smoker Pt counseld to quit pt not ready Followup: Return in about 3 months (around 04/02/2023).  Oliver Barre, MD 01/01/2023 9:11 PM [facility] Internal Medicine

## 2023-01-01 NOTE — Assessment & Plan Note (Signed)
Lab Results  Component Value Date   HGBA1C 5.3 01/10/2022   Stable, pt to continue current medical treatment  - diet, wt control

## 2023-01-01 NOTE — Assessment & Plan Note (Signed)
Pt counseld to quit pt not ready

## 2023-01-02 ENCOUNTER — Telehealth: Payer: Self-pay | Admitting: Internal Medicine

## 2023-01-02 DIAGNOSIS — R1013 Epigastric pain: Secondary | ICD-10-CM

## 2023-01-02 NOTE — Telephone Encounter (Signed)
Spoke with Verlon Au at [facility] who states they received the order for CT abdomen/pelvis with and without contrast. She states that the diagnosis code should be with contrast only

## 2023-01-02 NOTE — Telephone Encounter (Signed)
Ok this is done 

## 2023-01-02 NOTE — Telephone Encounter (Signed)
Please call [facility] to update patients order for a CT - This is a stat order  [phone]

## 2023-01-03 ENCOUNTER — Encounter: Payer: Self-pay | Admitting: Internal Medicine

## 2023-01-03 ENCOUNTER — Emergency Department (HOSPITAL_COMMUNITY): Payer: 59

## 2023-01-03 ENCOUNTER — Encounter (HOSPITAL_COMMUNITY): Payer: Self-pay | Admitting: Emergency Medicine

## 2023-01-03 ENCOUNTER — Other Ambulatory Visit: Payer: Self-pay

## 2023-01-03 ENCOUNTER — Emergency Department (HOSPITAL_COMMUNITY)
Admission: EM | Admit: 2023-01-03 | Discharge: 2023-01-03 | Disposition: A | Discharge status: Home / Self Care | Payer: 59 | Attending: Emergency Medicine | Admitting: Emergency Medicine

## 2023-01-03 DIAGNOSIS — Z743 Need for continuous supervision: Secondary | ICD-10-CM | POA: Diagnosis not present

## 2023-01-03 DIAGNOSIS — R1013 Epigastric pain: Secondary | ICD-10-CM | POA: Diagnosis present

## 2023-01-03 DIAGNOSIS — R1084 Generalized abdominal pain: Secondary | ICD-10-CM | POA: Diagnosis not present

## 2023-01-03 DIAGNOSIS — R112 Nausea with vomiting, unspecified: Secondary | ICD-10-CM | POA: Insufficient documentation

## 2023-01-03 DIAGNOSIS — R109 Unspecified abdominal pain: Secondary | ICD-10-CM | POA: Diagnosis not present

## 2023-01-03 DIAGNOSIS — R1110 Vomiting, unspecified: Secondary | ICD-10-CM | POA: Diagnosis not present

## 2023-01-03 DIAGNOSIS — Z91040 Latex allergy status: Secondary | ICD-10-CM | POA: Insufficient documentation

## 2023-01-03 DIAGNOSIS — R6889 Other general symptoms and signs: Secondary | ICD-10-CM | POA: Diagnosis not present

## 2023-01-03 DIAGNOSIS — E860 Dehydration: Secondary | ICD-10-CM

## 2023-01-03 DIAGNOSIS — I700 Atherosclerosis of aorta: Secondary | ICD-10-CM | POA: Diagnosis not present

## 2023-01-03 DIAGNOSIS — R1111 Vomiting without nausea: Secondary | ICD-10-CM | POA: Diagnosis not present

## 2023-01-03 DIAGNOSIS — R110 Nausea: Secondary | ICD-10-CM | POA: Diagnosis not present

## 2023-01-03 LAB — CBC WITH DIFFERENTIAL/PLATELET
Abs Immature Granulocytes: 0.03 10*3/uL (ref 0.00–0.07)
Basophils Absolute: 0 10*3/uL (ref 0.0–0.1)
Basophils Relative: 0 %
Eosinophils Absolute: 0 10*3/uL (ref 0.0–0.5)
Eosinophils Relative: 0 %
HCT: 39.7 % (ref 36.0–46.0)
Hemoglobin: 14.1 g/dL (ref 12.0–15.0)
Immature Granulocytes: 0 %
Lymphocytes Relative: 37 %
Lymphs Abs: 3.2 10*3/uL (ref 0.7–4.0)
MCH: 32.6 pg (ref 26.0–34.0)
MCHC: 35.5 g/dL (ref 30.0–36.0)
MCV: 91.7 fL (ref 80.0–100.0)
Monocytes Absolute: 0.6 10*3/uL (ref 0.1–1.0)
Monocytes Relative: 7 %
Neutro Abs: 4.8 10*3/uL (ref 1.7–7.7)
Neutrophils Relative %: 56 %
Platelets: 289 10*3/uL (ref 150–400)
RBC: 4.33 MIL/uL (ref 3.87–5.11)
RDW: 13.2 % (ref 11.5–15.5)
WBC: 8.6 10*3/uL (ref 4.0–10.5)
nRBC: 0 % (ref 0.0–0.2)

## 2023-01-03 LAB — URINALYSIS, ROUTINE W REFLEX MICROSCOPIC
Bilirubin Urine: NEGATIVE
Glucose, UA: NEGATIVE mg/dL
Hgb urine dipstick: NEGATIVE
Ketones, ur: NEGATIVE mg/dL
Nitrite: NEGATIVE
Protein, ur: NEGATIVE mg/dL
Specific Gravity, Urine: 1.008 (ref 1.005–1.030)
pH: 5 (ref 5.0–8.0)

## 2023-01-03 LAB — COMPREHENSIVE METABOLIC PANEL
ALT: 7 U/L (ref 0–44)
AST: 14 U/L — ABNORMAL LOW (ref 15–41)
Albumin: 4.3 g/dL (ref 3.5–5.0)
Alkaline Phosphatase: 77 U/L (ref 38–126)
Anion gap: 8 (ref 5–15)
BUN: 13 mg/dL (ref 8–23)
CO2: 22 mmol/L (ref 22–32)
Calcium: 9 mg/dL (ref 8.9–10.3)
Chloride: 105 mmol/L (ref 98–111)
Creatinine, Ser: 1.05 mg/dL — ABNORMAL HIGH (ref 0.44–1.00)
GFR, Estimated: 59 mL/min — ABNORMAL LOW (ref >60)
Glucose, Bld: 103 mg/dL — ABNORMAL HIGH (ref 70–99)
Potassium: 3.9 mmol/L (ref 3.5–5.1)
Sodium: 135 mmol/L (ref 135–145)
Total Bilirubin: 0.4 mg/dL (ref 0.3–1.2)
Total Protein: 6.7 g/dL (ref 6.5–8.1)

## 2023-01-03 LAB — LIPASE, BLOOD: Lipase: 26 U/L (ref 11–51)

## 2023-01-03 MED ORDER — IOHEXOL 300 MG/ML  SOLN
80.0000 mL | Freq: Once | INTRAMUSCULAR | Status: AC | PRN
  Administered 2023-01-03: 100 mL via INTRAVENOUS

## 2023-01-03 MED ORDER — SODIUM CHLORIDE (PF) 0.9 % IJ SOLN
INTRAMUSCULAR | Status: AC
  Filled 2023-01-03: qty 50

## 2023-01-03 MED ORDER — LACTATED RINGERS IV SOLN: INTRAVENOUS | Status: DC

## 2023-01-03 MED ORDER — LACTATED RINGERS IV BOLUS
2000.0000 mL | Freq: Once | INTRAVENOUS | Status: AC
  Administered 2023-01-03: 2000 mL via INTRAVENOUS

## 2023-01-03 MED ORDER — DIPHENHYDRAMINE HCL 50 MG/ML IJ SOLN
12.5000 mg | Freq: Once | INTRAMUSCULAR | Status: AC
  Administered 2023-01-03: 12.5 mg via INTRAVENOUS
  Filled 2023-01-03: qty 1

## 2023-01-03 MED ORDER — METOCLOPRAMIDE HCL 5 MG/ML IJ SOLN
10.0000 mg | Freq: Once | INTRAMUSCULAR | Status: AC
  Administered 2023-01-03: 10 mg via INTRAVENOUS
  Filled 2023-01-03: qty 2

## 2023-01-03 NOTE — ED Triage Notes (Signed)
Pt BIB EMS from home, c/o increase abdominal pain with N/V. Recent hernia repair surgery on 2-25. Per EMS: sent to PCP for follow up with blood work collected. MD office suggested ED for fluid replacement.   BP 144/74 P 90 CBG 112

## 2023-01-03 NOTE — ED Provider Notes (Signed)
[place] EMERGENCY DEPARTMENT AT [facility] Provider Note   CSN: [id number] Arrival date & time: 01/03/23  0725     History  Chief Complaint  Patient presents with   Abdominal Pain   Nausea   Emesis    Lori [name] is a 67 y.o. female.  67 year old female presents with persistent epigastric pain times several weeks.  Also outside records reviewed and patient has been having 6 weeks of epigastric pain ever since she had her incarcerated diaphragmatic hernia surgery done 2 months ago.  States that every day she has emesis when she tries to eat.  States that the emesis has been nonbilious or bloody.  No fever or chills.  Unrelieved with her home medication.  Was seen her doctor 2 days ago for similar symptoms.  Had blood work done and she was told that she was dehydrated and that she needed to come to the ER.  Scheduled for an outpatient CT.       Home Medications Prior to Admission medications   Medication Sig Start Date End Date Taking? Authorizing Provider  albuterol (PROAIR HFA) 108 (90 Base) MCG/ACT inhaler Inhale 2 puffs into the lungs every 6 (six) hours as needed for wheezing or shortness of breath. 12/27/22   Corwin Levins, MD  ALPRAZolam Prudy Feeler) 1 MG tablet TAKE 1 TABLET BY MOUTH AT BEDTIME AS NEEDED FOR ANXIETY 12/16/22   Corwin Levins, MD  Benralizumab [facility]) 30 MG/ML SOSY INJECT  SUBCUTANEOUSLY  EVERY 8 WEEKS 09/12/22   Alfonse Spruce, MD  dexlansoprazole (DEXILANT) 60 MG capsule Take 1 capsule (60 mg total) by mouth daily. 06/25/22   Corwin Levins, MD  EPINEPHrine 0.3 mg/0.3 mL IJ SOAJ injection INJECT AS DIRECTED FOR SEVERE ALLERGIC REACTION 09/25/22   Alfonse Spruce, MD  gabapentin (NEURONTIN) 300 MG capsule Take 300 mg by mouth in the morning. 07/13/20   [provider]  gabapentin (NEURONTIN) 600 MG tablet Take 600 mg by mouth at bedtime. 04/30/22   [provider]  lidocaine (LIDODERM) 5 % Place 1 patch onto the  skin daily. Remove & Discard patch within 12 hours or as directed by MD 09/05/22   Alfonse Spruce, MD  meclizine (ANTIVERT) 12.5 MG tablet Take 1 tablet (12.5 mg total) by mouth 3 (three) times daily as needed for dizziness. Patient taking differently: Take 12.5 mg by mouth 3 (three) times daily. 10/08/22 10/08/23  Corwin Levins, MD  montelukast (SINGULAIR) 10 MG tablet Take 1 tablet (10 mg total) by mouth at bedtime. Patient taking differently: Take 10 mg by mouth in the morning. 06/25/22   Corwin Levins, MD  nitroGLYCERIN (NITROSTAT) 0.4 MG SL tablet PLACE 1 TABLET UNDER THE TONGUE EVERY 5 MINUTES AS NEEDED FOR CHEST PAIN 07/16/22   [name], MD  oxyCODONE (ROXICODONE) 5 MG immediate release tablet Take 1 tablet (5 mg total) by mouth every 8 (eight) hours as needed. 01/01/23 01/01/24  Corwin Levins, MD  pantoprazole (PROTONIX) 40 MG tablet Take 1 tablet (40 mg total) by mouth daily. 01/01/23   Corwin Levins, MD  PARoxetine (PAXIL) 10 MG tablet Take 10 mg by mouth in the morning.    [provider]  predniSONE (DELTASONE) 10 MG tablet 3 tabs by mouth per day for 3 days,2tabs per day for 3 days,1tab per day for 3 days 06/07/22   Corwin Levins, MD  tiZANidine (ZANAFLEX) 2 MG tablet Take 1 tablet (2 mg total)  by mouth every 8 (eight) hours as needed for muscle spasms. 09/08/22   Tilden Fossa, MD  valACYclovir Ralph Dowdy) 1000 MG tablet Take 1 tablet by mouth twice daily 12/10/22   Corwin Levins, MD      Allergies    Advil [ibuprofen], Bee venom, Codeine, Clonidine derivatives, Statins, Latex, Levaquin [levofloxacin], and Propoxyphene n-acetaminophen    Review of Systems   Review of Systems  All other systems reviewed and are negative.   Physical Exam Updated Vital Signs BP 121/79 (BP Location: Right Arm)   Pulse 83   Temp (!) 97.5 F (36.4 C) (Oral)   Resp (!) 21   SpO2 98%  Physical Exam Vitals and nursing note reviewed.  Constitutional:      General: She is not in  acute distress.    Appearance: Normal appearance. She is well-developed. She is not toxic-appearing.  HENT:     Head: Normocephalic and atraumatic.  Eyes:     General: Lids are normal.     Conjunctiva/sclera: Conjunctivae normal.     Pupils: Pupils are equal, round, and reactive to light.  Neck:     Thyroid: No thyroid mass.     Trachea: No tracheal deviation.  Cardiovascular:     Rate and Rhythm: Normal rate and regular rhythm.     Heart sounds: Normal heart sounds. No murmur heard.    No gallop.  Pulmonary:     Effort: Pulmonary effort is normal. No respiratory distress.     Breath sounds: Normal breath sounds. No stridor. No decreased breath sounds, wheezing, rhonchi or rales.  Abdominal:     General: There is no distension.     Palpations: Abdomen is soft.     Tenderness: There is generalized abdominal tenderness. There is no rebound.  Musculoskeletal:        General: No tenderness. Normal range of motion.     Cervical back: Normal range of motion and neck supple.  Skin:    General: Skin is warm and dry.     Findings: No abrasion or rash.  Neurological:     Mental Status: She is alert and oriented to person, place, and time. Mental status is at baseline.     GCS: GCS eye subscore is 4. GCS verbal subscore is 5. GCS motor subscore is 6.     Cranial Nerves: No cranial nerve deficit.     Sensory: No sensory deficit.     Motor: Motor function is intact.  Psychiatric:        Attention and Perception: Attention normal.        Speech: Speech normal.        Behavior: Behavior normal.     ED Results / Procedures / Treatments   Labs (all labs ordered are listed, but only abnormal results are displayed) Labs Reviewed - No data to display  EKG None  Radiology No results found.  Procedures Procedures    Medications Ordered in ED Medications  lactated ringers bolus 2,000 mL (has no administration in time range)  lactated ringers infusion (has no administration in time  range)  metoCLOPramide (REGLAN) injection 10 mg (has no administration in time range)  diphenhydrAMINE (BENADRYL) injection 12.5 mg (has no administration in time range)    ED Course/ Medical Decision Making/ A&P                             Medical Decision Making Amount and/or Complexity of Data  Reviewed Labs: ordered. Radiology: ordered.  Risk Prescription drug management.   Patient given IV fluids and medicated for pain here and feels better.  Concern for possible intra-abdominal process and abdominal CT performed and per interpretation shows no acute findings.  Electrolytes did show some of signs of dehydration.  Given IV fluids.  Patient feels better will be discharged home        Final Clinical Impression(s) / ED Diagnoses Final diagnoses:  None    Rx / DC Orders ED Discharge Orders     None         Lorre Nick, MD 01/03/23 1322

## 2023-01-03 NOTE — ED Notes (Signed)
Pt able to ambulate with some assistant

## 2023-01-07 ENCOUNTER — Telehealth: Payer: Self-pay

## 2023-01-07 DIAGNOSIS — G43719 Chronic migraine without aura, intractable, without status migrainosus: Secondary | ICD-10-CM | POA: Diagnosis not present

## 2023-01-07 NOTE — Telephone Encounter (Signed)
        Patient  visited [facility] on 01/03/2023  for Abdominal Pain  Nausea  Emesis.   Telephone encounter attempt : 1st   A HIPAA compliant voice message was left requesting a return call.  Instructed patient to call back at [phone].   Silvino Selman [facility]  [facility] Population Health Community Resource Care Guide   [email or web address]  ?? [phone]   Website: [email or web address]

## 2023-01-08 IMAGING — DX DG CHEST 2V
2 series · 2 of 2 positions shown · non-contrast
Comparison: Chest x-ray 08/21/2020.

CLINICAL DATA: 64-year-old female with history of dry cough for 1
month.

EXAM:
CHEST - 2 VIEW

[chest pa]
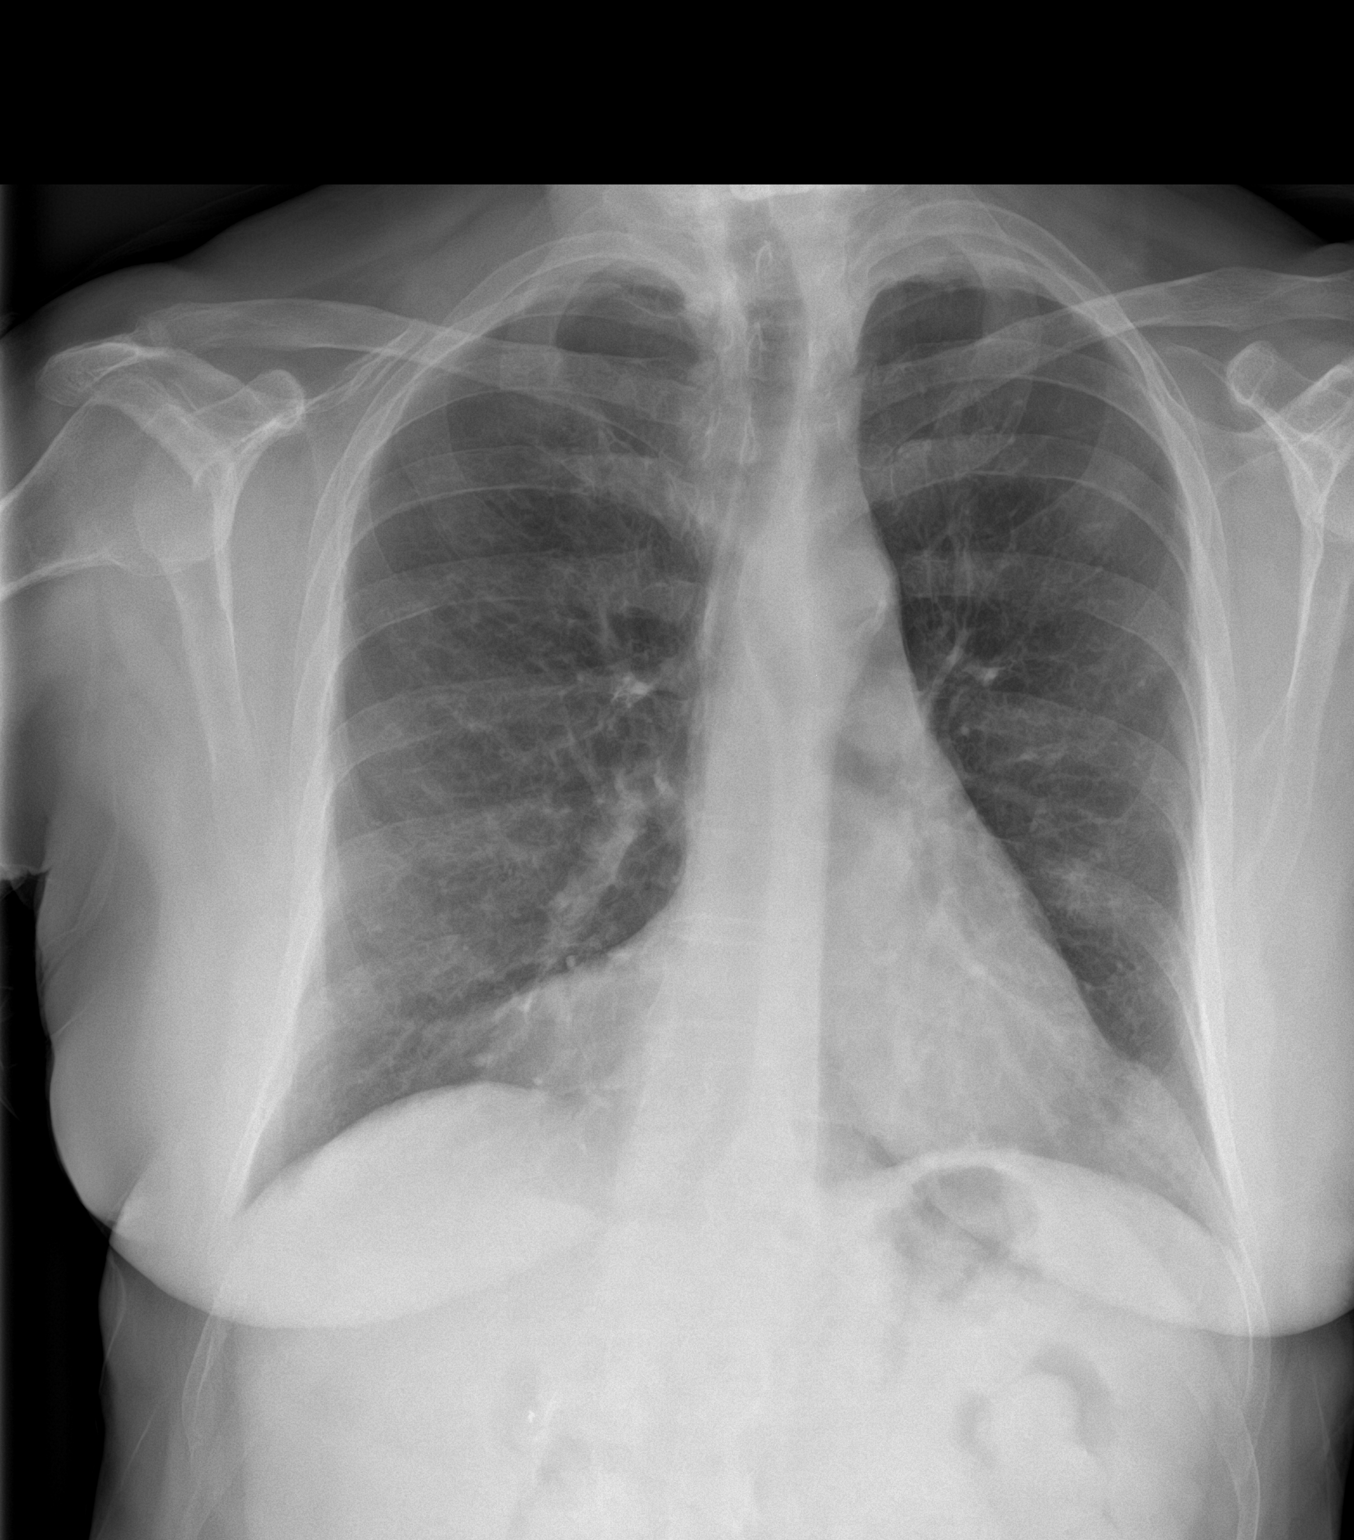

[chest lat]
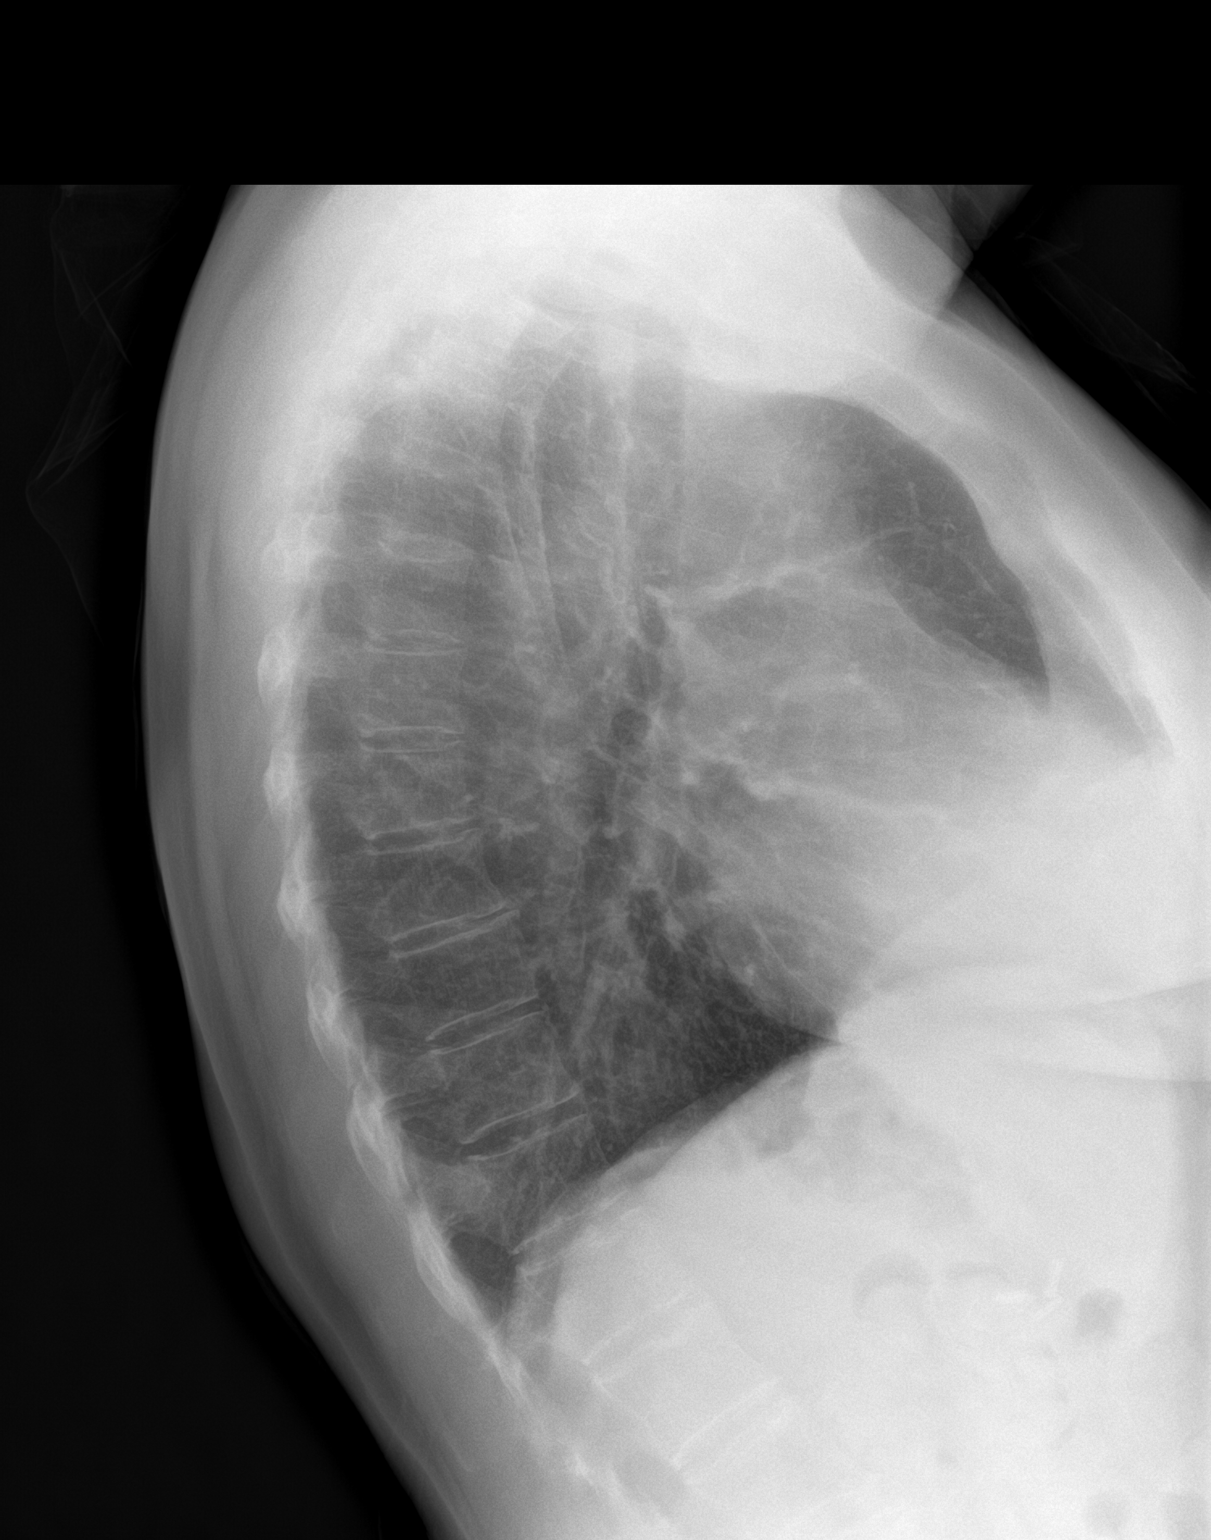

[2 of 2 positions shown; findings below may reference images not displayed]

FINDINGS: Lung volumes are normal. No consolidative airspace disease. No
pleural effusions. No pneumothorax. No pulmonary nodule or mass
noted. Pulmonary vasculature and the cardiomediastinal silhouette
are within normal limits. Atherosclerosis in the thoracic aorta.
Orthopedic fixation hardware in the lower cervical spine
incidentally noted. Surgical clips project over the right upper
quadrant of the abdomen, likely from prior cholecystectomy.
IMPRESSION: 1.  No radiographic evidence of acute cardiopulmonary disease.
2. Aortic atherosclerosis.

1.

## 2023-01-09 ENCOUNTER — Telehealth: Payer: Self-pay

## 2023-01-09 NOTE — Telephone Encounter (Signed)
        Patient  visited [facility] on 01/03/2023  for Abdominal Pain  Nausea  Emesis.   Telephone encounter attempt :  2nd  A HIPAA compliant voice message was left requesting a return call.  Instructed patient to call back at [phone].   Izyan Ezzell [facility]  [facility] Population Health Community Resource Care Guide   [email or web address]  ?? [phone]   Website: [email or web address]  [email or web address]

## 2023-01-10 ENCOUNTER — Ambulatory Visit (INDEPENDENT_AMBULATORY_CARE_PROVIDER_SITE_OTHER): Payer: 59

## 2023-01-10 DIAGNOSIS — J4550 Severe persistent asthma, uncomplicated: Secondary | ICD-10-CM

## 2023-01-16 ENCOUNTER — Telehealth: Payer: Self-pay | Admitting: Internal Medicine

## 2023-01-16 MED ORDER — DIPHENOXYLATE-ATROPINE 2.5-0.025 MG PO TABS: 1.0000 | ORAL_TABLET | Freq: Four times a day (QID) | ORAL | 0 refills | Status: DC | PRN

## 2023-01-16 NOTE — Telephone Encounter (Signed)
Ok for limited lomotil  - done erx  But let pt know she may wish to consider rov if this persists, or especially if she has fever, pain, nausea vomiting  blood or other unusual symptoms , as this lomotil is not meant to be a long term treatment.  thanks

## 2023-01-16 NOTE — Telephone Encounter (Signed)
Pt called stating she need diarrhea medicine prescribe and also stated he has given it to her before. Pt said it started this morning but no over counter medicine is not working.

## 2023-02-06 ENCOUNTER — Ambulatory Visit (INDEPENDENT_AMBULATORY_CARE_PROVIDER_SITE_OTHER): Payer: 59

## 2023-02-06 DIAGNOSIS — J309 Allergic rhinitis, unspecified: Secondary | ICD-10-CM

## 2023-02-06 DIAGNOSIS — G43719 Chronic migraine without aura, intractable, without status migrainosus: Secondary | ICD-10-CM | POA: Diagnosis not present

## 2023-02-11 ENCOUNTER — Telehealth: Payer: Self-pay | Admitting: Internal Medicine

## 2023-02-11 MED ORDER — CYCLOBENZAPRINE HCL 5 MG PO TABS: 5.0000 mg | ORAL_TABLET | Freq: Three times a day (TID) | ORAL | 2 refills | Status: DC | PRN

## 2023-02-11 NOTE — Telephone Encounter (Signed)
Med is not on med list pls advise../lmb 

## 2023-02-11 NOTE — Telephone Encounter (Signed)
Done erx 

## 2023-02-11 NOTE — Telephone Encounter (Signed)
Prescription Request  02/11/2023  LOV: 01/01/2023  What is the name of the medication or equipment? flexeril  Have you contacted your pharmacy to request a refill? Yes   Which pharmacy would you like this sent to?  [facility] [address], [place] - [address] [place] [address] Phone: [phone] Fax: [phone]    Patient notified that their request is being sent to the clinical staff for review and that they should receive a response within 2 business days.   Please advise at Mobile [phone] (mobile)

## 2023-02-23 ENCOUNTER — Other Ambulatory Visit: Payer: Self-pay | Admitting: Internal Medicine

## 2023-03-03 ENCOUNTER — Ambulatory Visit (INDEPENDENT_AMBULATORY_CARE_PROVIDER_SITE_OTHER): Payer: 59

## 2023-03-03 DIAGNOSIS — J309 Allergic rhinitis, unspecified: Secondary | ICD-10-CM | POA: Diagnosis not present

## 2023-03-06 DIAGNOSIS — G43719 Chronic migraine without aura, intractable, without status migrainosus: Secondary | ICD-10-CM | POA: Diagnosis not present

## 2023-03-07 ENCOUNTER — Ambulatory Visit (INDEPENDENT_AMBULATORY_CARE_PROVIDER_SITE_OTHER): Payer: 59 | Admitting: *Deleted

## 2023-03-07 DIAGNOSIS — J4550 Severe persistent asthma, uncomplicated: Secondary | ICD-10-CM

## 2023-03-08 IMAGING — DX DG CHEST 2V
2 series · 2 of 2 positions shown · non-contrast
Comparison: 11/14/2020

Correlation: CT abdomen and pelvis 11/14/2017

CLINICAL DATA: Increased dyspnea, cough and congestion for 2 weeks,
history asthma, COPD, restrictive lung disease, smoker

EXAM:
CHEST - 2 VIEW

[chest pa]
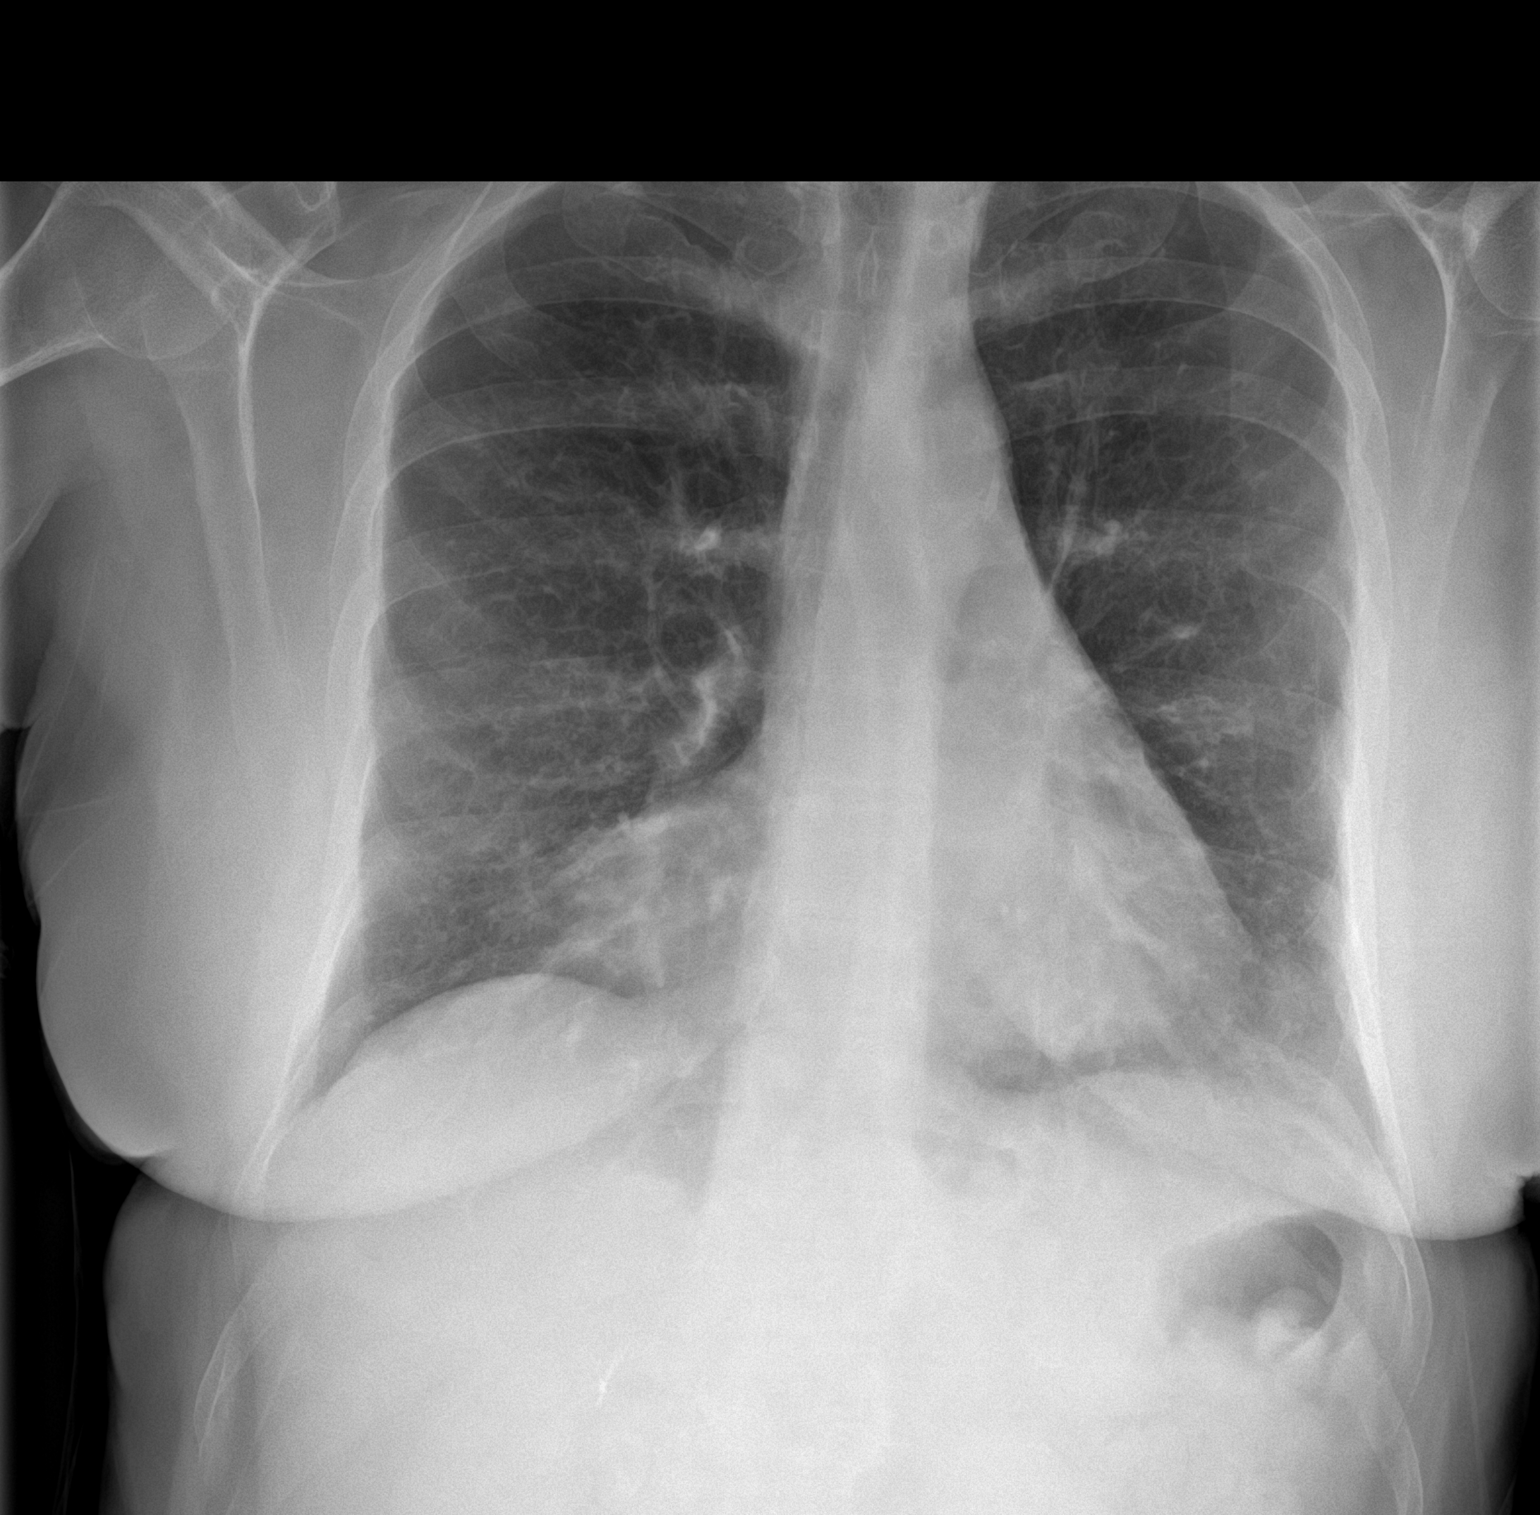

[chest lat]
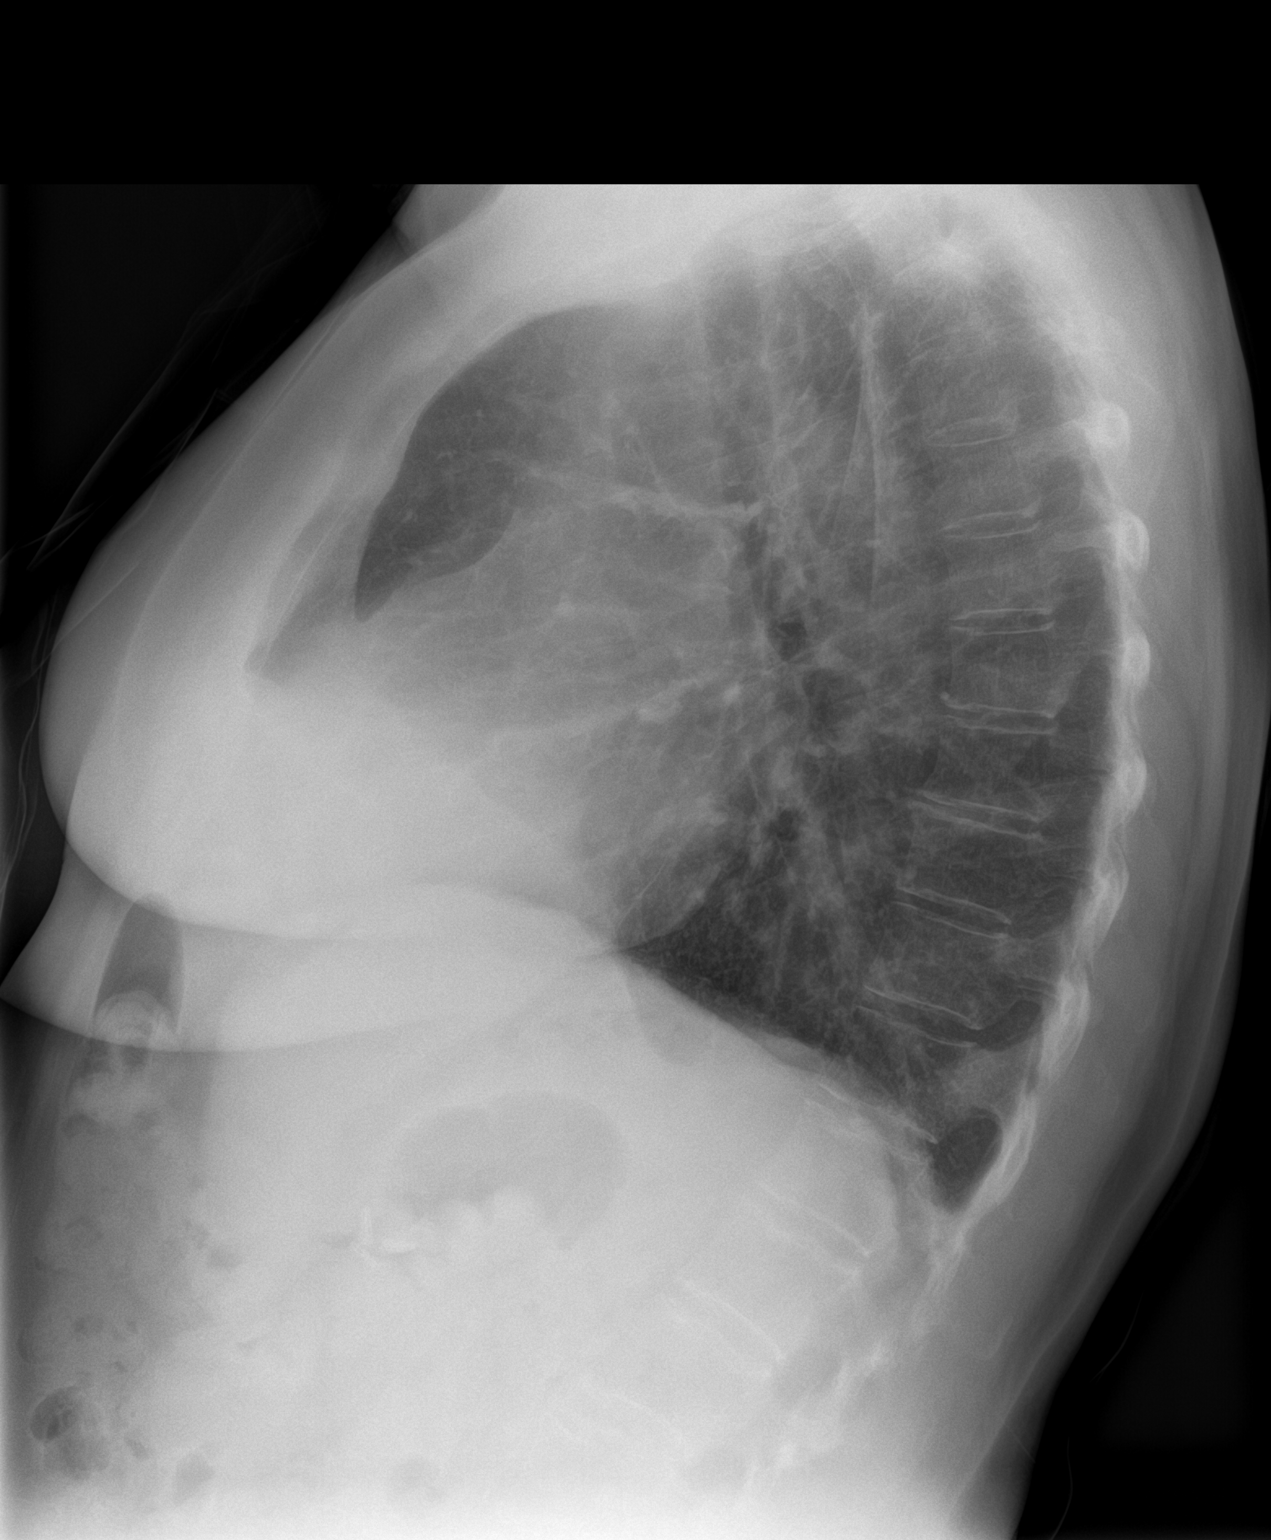

[2 of 2 positions shown; findings below may reference images not displayed]

FINDINGS: Normal heart size and pulmonary vascularity.

Atherosclerotic calcification aorta.

Prominent density at the RIGHT cardiophrenic angle, corresponding to
prominent epicardial fat pad on prior CT.

Changes of COPD with minimal scarring in the lateral LEFT mid lung
and at LEFT apex.

No acute infiltrate, pleural effusion or pneumothorax.

Bones demineralized.
IMPRESSION: COPD changes with LEFT lung scarring.

Chronic enlargement of RIGHT epicardial fat pad.

No acute abnormalities.

Aortic Atherosclerosis (TICAL-P2U.U) and Emphysema (TICAL-MHH.3).

## 2023-03-12 DIAGNOSIS — J302 Other seasonal allergic rhinitis: Secondary | ICD-10-CM | POA: Diagnosis not present

## 2023-03-12 NOTE — Progress Notes (Signed)
VIALS EXP 03-11-24 

## 2023-03-13 DIAGNOSIS — J3081 Allergic rhinitis due to animal (cat) (dog) hair and dander: Secondary | ICD-10-CM | POA: Diagnosis not present

## 2023-03-16 ENCOUNTER — Other Ambulatory Visit: Payer: Self-pay | Admitting: Internal Medicine

## 2023-03-17 ENCOUNTER — Other Ambulatory Visit: Payer: Self-pay

## 2023-04-02 ENCOUNTER — Ambulatory Visit (INDEPENDENT_AMBULATORY_CARE_PROVIDER_SITE_OTHER): Payer: 59 | Admitting: *Deleted

## 2023-04-02 ENCOUNTER — Ambulatory Visit: Payer: 59 | Admitting: Internal Medicine

## 2023-04-02 DIAGNOSIS — G43719 Chronic migraine without aura, intractable, without status migrainosus: Secondary | ICD-10-CM | POA: Diagnosis not present

## 2023-04-02 DIAGNOSIS — J309 Allergic rhinitis, unspecified: Secondary | ICD-10-CM

## 2023-04-09 ENCOUNTER — Ambulatory Visit (INDEPENDENT_AMBULATORY_CARE_PROVIDER_SITE_OTHER): Payer: 59 | Admitting: Internal Medicine

## 2023-04-09 ENCOUNTER — Ambulatory Visit (INDEPENDENT_AMBULATORY_CARE_PROVIDER_SITE_OTHER): Payer: 59

## 2023-04-09 ENCOUNTER — Encounter: Payer: Self-pay | Admitting: Internal Medicine

## 2023-04-09 VITALS — BP 120/72 | HR 79 | Temp 98.2°F | Ht 64.0 in | Wt 118.0 lb

## 2023-04-09 DIAGNOSIS — F32A Depression, unspecified: Secondary | ICD-10-CM | POA: Diagnosis not present

## 2023-04-09 DIAGNOSIS — E538 Deficiency of other specified B group vitamins: Secondary | ICD-10-CM | POA: Diagnosis not present

## 2023-04-09 DIAGNOSIS — F17200 Nicotine dependence, unspecified, uncomplicated: Secondary | ICD-10-CM | POA: Diagnosis not present

## 2023-04-09 DIAGNOSIS — R062 Wheezing: Secondary | ICD-10-CM | POA: Diagnosis not present

## 2023-04-09 DIAGNOSIS — J441 Chronic obstructive pulmonary disease with (acute) exacerbation: Secondary | ICD-10-CM

## 2023-04-09 DIAGNOSIS — E785 Hyperlipidemia, unspecified: Secondary | ICD-10-CM | POA: Diagnosis not present

## 2023-04-09 DIAGNOSIS — R0789 Other chest pain: Secondary | ICD-10-CM | POA: Diagnosis not present

## 2023-04-09 DIAGNOSIS — F419 Anxiety disorder, unspecified: Secondary | ICD-10-CM

## 2023-04-09 DIAGNOSIS — R739 Hyperglycemia, unspecified: Secondary | ICD-10-CM

## 2023-04-09 DIAGNOSIS — E559 Vitamin D deficiency, unspecified: Secondary | ICD-10-CM | POA: Diagnosis not present

## 2023-04-09 DIAGNOSIS — R918 Other nonspecific abnormal finding of lung field: Secondary | ICD-10-CM | POA: Diagnosis not present

## 2023-04-09 LAB — CBC WITH DIFFERENTIAL/PLATELET
Basophils Absolute: 0 10*3/uL (ref 0.0–0.1)
Basophils Relative: 0.3 % (ref 0.0–3.0)
Eosinophils Absolute: 0 10*3/uL (ref 0.0–0.7)
Eosinophils Relative: 0 % (ref 0.0–5.0)
HCT: 40 % (ref 36.0–46.0)
Hemoglobin: 13.3 g/dL (ref 12.0–15.0)
Lymphocytes Relative: 34.8 % (ref 12.0–46.0)
Lymphs Abs: 2.9 10*3/uL (ref 0.7–4.0)
MCHC: 33.4 g/dL (ref 30.0–36.0)
MCV: 97 fl (ref 78.0–100.0)
Monocytes Absolute: 0.5 10*3/uL (ref 0.1–1.0)
Monocytes Relative: 6.1 % (ref 3.0–12.0)
Neutro Abs: 4.9 10*3/uL (ref 1.4–7.7)
Neutrophils Relative %: 58.8 % (ref 43.0–77.0)
Platelets: 291 10*3/uL (ref 150.0–400.0)
RBC: 4.12 Mil/uL (ref 3.87–5.11)
RDW: 13.8 % (ref 11.5–15.5)
WBC: 8.3 10*3/uL (ref 4.0–10.5)

## 2023-04-09 LAB — LDL CHOLESTEROL, DIRECT: Direct LDL: 178 mg/dL

## 2023-04-09 LAB — LIPID PANEL
Cholesterol: 286 mg/dL — ABNORMAL HIGH (ref 0–200)
HDL: 36.1 mg/dL — ABNORMAL LOW (ref >39.00)
Total CHOL/HDL Ratio: 8
Triglycerides: 549 mg/dL — ABNORMAL HIGH (ref 0.0–149.0)

## 2023-04-09 LAB — BASIC METABOLIC PANEL
BUN: 14 mg/dL (ref 6–23)
CO2: 24 mEq/L (ref 19–32)
Calcium: 9.3 mg/dL (ref 8.4–10.5)
Chloride: 102 mEq/L (ref 96–112)
Creatinine, Ser: 1.64 mg/dL — ABNORMAL HIGH (ref 0.40–1.20)
GFR: 32.39 mL/min — ABNORMAL LOW (ref >60.00)
Glucose, Bld: 105 mg/dL — ABNORMAL HIGH (ref 70–99)
Potassium: 3.9 mEq/L (ref 3.5–5.1)
Sodium: 135 mEq/L (ref 135–145)

## 2023-04-09 LAB — HEPATIC FUNCTION PANEL
ALT: 8 U/L (ref 0–35)
AST: 14 U/L (ref 0–37)
Albumin: 4.5 g/dL (ref 3.5–5.2)
Alkaline Phosphatase: 86 U/L (ref 39–117)
Bilirubin, Direct: 0 mg/dL (ref 0.0–0.3)
Total Bilirubin: 0.4 mg/dL (ref 0.2–1.2)
Total Protein: 6.9 g/dL (ref 6.0–8.3)

## 2023-04-09 LAB — VITAMIN B12: Vitamin B-12: 135 pg/mL — ABNORMAL LOW (ref 211–911)

## 2023-04-09 LAB — HEMOGLOBIN A1C: Hgb A1c MFr Bld: 5.3 % (ref 4.6–6.5)

## 2023-04-09 LAB — VITAMIN D 25 HYDROXY (VIT D DEFICIENCY, FRACTURES): VITD: 13.5 ng/mL — ABNORMAL LOW (ref 30.00–100.00)

## 2023-04-09 MED ORDER — ALPRAZOLAM 1 MG PO TABS: ORAL_TABLET | ORAL | 1 refills | Status: DC

## 2023-04-09 MED ORDER — HYDROCODONE BIT-HOMATROP MBR 5-1.5 MG/5ML PO SOLN: 5.0000 mL | Freq: Four times a day (QID) | ORAL | 0 refills | Status: AC | PRN

## 2023-04-09 MED ORDER — DOXYCYCLINE HYCLATE 100 MG PO TABS
100.0000 mg | ORAL_TABLET | Freq: Two times a day (BID) | ORAL | 0 refills | Status: DC
Start: 2023-04-09 — End: 2023-04-30

## 2023-04-09 MED ORDER — CITALOPRAM HYDROBROMIDE 20 MG PO TABS: 20.0000 mg | ORAL_TABLET | Freq: Every day | ORAL | 3 refills | Status: DC

## 2023-04-09 MED ORDER — PREDNISONE 10 MG PO TABS: ORAL_TABLET | ORAL | 0 refills | Status: DC

## 2023-04-09 MED ORDER — METHYLPREDNISOLONE ACETATE 80 MG/ML IJ SUSP
80.0000 mg | Freq: Once | INTRAMUSCULAR | Status: AC
Start: 2023-04-09 — End: 2023-04-09
  Administered 2023-04-09: 80 mg via INTRAMUSCULAR

## 2023-04-09 NOTE — Assessment & Plan Note (Addendum)
Mild to mod, for antibx course, cough med prn, depomedrol 80 mg IM, prednisone taper, for cxr, labs today including cbc bmp, to f/u any worsening symptoms or concerns

## 2023-04-09 NOTE — Progress Notes (Signed)
Chief Complaint: follow up copd exacerbation, depression, anxiety, smoking       HPI:  Lori [name] is a 67 y.o. female here with c/o Here with acute onset mild to mod 2-3 days ST, HA, general weakness and malaise, with prod cough greenish sputum, but Pt denies chest pain, increased sob or doe, wheezing, orthopnea, PND, increased LE swelling, palpitations, dizziness or syncope, but has decrease po intake, general weakness and mild wheezing sob starting last pm.  Denies worsening depressive symptoms, suicidal ideation, or panic; has worsening anxiety, asking for increased tx.   Pt denies polydipsia, polyuria, or new focal neuro s/s.    Pt denies recent wt loss, night sweats, loss of appetite, or other constitutional symptoms  Still smoking, not ready to quit       Wt Readings from Last 3 Encounters:  04/09/23 118 lb (53.5 kg)  01/01/23 117 lb (53.1 kg)  11/12/22 118 lb 12.8 oz (53.9 kg)   BP Readings from Last 3 Encounters:  04/09/23 120/72  01/03/23 (!) 148/89  01/01/23 132/80         Past Medical History:  Diagnosis Date   Allergic rhinitis    Anemia    hgb 13.9 on 11/22/21   Ankle fracture 05/2016   Anxiety    Arthritis    Asthma    Barrett esophagus 2007   Chest pain    Hx, no current problems as of 11/22/21.  Recurrent episodes.  Has had 3 negative nuclear stress test, and a completely normal coronary CT angiogram   COPD (chronic obstructive pulmonary disease) with chronic bronchitis    & Emphysema   Depression    Duodenitis without mention of hemorrhage 2007   Esophageal reflux 2007   Esophageal stricture    Full dentures    patient does not wear dentures as of 11/22/21   H/O hiatal hernia    Hiatal hernia 2006,2007   HSV infection    Hyperlipidemia    rx crestor but pt not taking   Multiple fractures    from falls, fx rt. elbow, fx left wrist, bilateral ankles   Pneumonia 10/2016   Restrictive lung disease 06/30/2017   Ulcer    no current problems as of 11/22/21    Past Surgical History:  Procedure Laterality Date   ABDOMINAL HYSTERECTOMY     BSO   APPENDECTOMY     BIOPSY  04/04/2022   Procedure: BIOPSY;  Surgeon: Lemar Lofty., MD;  Location: [facility] ENDOSCOPY;  Service: Gastroenterology;;   CESAREAN SECTION     x 2   CHOLECYSTECTOMY     CHONDROPLASTY Left 01/06/2015   Procedure: CHONDROPLASTY;  Surgeon: Eldred Manges, MD;  Location: [facility];  Service: Orthopedics;  Laterality: Left;   COLONOSCOPY     CT CTA CORONARY W/CA SCORE W/CM &/OR WO/CM  12/02/2016   CORONARY CALCIUM SCORE 0.  NO EVIDENCE OF CAD.  Normal-sized PA.  Normal pulmonary venous drainage the left atrium.  No ASD or VSD.   ELBOW FRACTURE SURGERY     right   ESOPHAGOGASTRODUODENOSCOPY (EGD) WITH PROPOFOL N/A 04/04/2022   Procedure: ESOPHAGOGASTRODUODENOSCOPY (EGD) WITH PROPOFOL;  Surgeon: Meridee Score Netty Starring., MD;  Location: [facility] ENDOSCOPY;  Service: Gastroenterology;  Laterality: N/A;   EUS N/A 04/04/2022   Procedure: UPPER ENDOSCOPIC ULTRASOUND (EUS) RADIAL;  Surgeon: Lemar Lofty., MD;  Location: [facility] ENDOSCOPY;  Service: Gastroenterology;  Laterality: N/A;   EYE SURGERY Bilateral    cataracts  removed   INSERTION OF MESH N/A 11/12/2022   Procedure: INSERTION OF MESH;  Surgeon: Axel Filler, MD;  Location: [facility] OR;  Service: General;  Laterality: N/A;   KNEE ARTHROSCOPY WITH EXCISION PLICA Left 01/06/2015   Procedure: KNEE ARTHROSCOPY WITH EXCISION PLICA;  Surgeon: Eldred Manges, MD;  Location: [facility];  Service: Orthopedics;  Laterality: Left;   LIGAMENT REPAIR Right 11/28/2021   Procedure: RIGHT THUMB LIGAMENT RECONSTRUCTION AND TENDON INTERPOSITION;  Surgeon: Marlyne Beards, MD;  Location: [facility] OR;  Service: Orthopedics;  Laterality: Right;   Lower Extremity Arterial Dopplers  07/06/2013   RABI 1.0, LABI 1.1.; No evidence of significant vascular atherogenic plaque   NECK SURGERY     NM MYOVIEW LTD  09/2014   a) LOW RISK.  Normal EF of 65%, no RWMA. NO ISCHEMIA OR INFARCTION.;; b) 05/02/2009: Normal study.  LOW RISK.  No ischemia or infarction.  EF 74%.  Breast attenuation noted.   TONSILLECTOMY     TRANSTHORACIC ECHOCARDIOGRAM  03/28/2021   a) Normal EF 60 to 65%.  GR 1 DD.  No R WMA.  Normal RV size and function.  Normal RAP.  Normal valves. => NORMAL;; b) 04/23/16/2010: Normal study.  LOW RISK.  No ischemia or infarction.  EF 74%.  Breast attenuation noted.17/2010: ([facility]) EF>55%.  Normal wall motion.  Normal valves.   WRIST FRACTURE SURGERY     left, has plate   XI ROBOTIC ASSISTED PARAESOPHAGEAL HERNIA REPAIR N/A 11/12/2022   Procedure: ROBOTIC DIAPHRAGMATIC HERNIA REPAIR WITH MESH;  Surgeon: Axel Filler, MD;  Location: [facility] OR;  Service: General;  Laterality: N/A;    reports that she has been smoking cigarettes. She started smoking about 45 years ago. She has a 22.8 pack-year smoking history. She has never used smokeless tobacco. She reports that she does not drink alcohol and does not use drugs. family history includes Dementia in her mother; Diabetes in her maternal aunt, maternal grandmother, and another family member; Emphysema in her father; Heart disease in her brother. Allergies  Allergen Reactions   Advil [Ibuprofen] Nausea And Vomiting   Bee Venom Swelling   Codeine Nausea Only and Other (See Comments)    CAUSES ULCERS   Clonidine Derivatives Other (See Comments)    Unknown    Statins Other (See Comments)    unknown   Latex Rash   Levaquin [Levofloxacin] Nausea Only   Propoxyphene N-Acetaminophen Nausea Only   Current Outpatient Medications on File Prior to Visit  Medication Sig Dispense Refill   albuterol (PROAIR HFA) 108 (90 Base) MCG/ACT inhaler Inhale 2 puffs into the lungs every 6 (six) hours as needed for wheezing or shortness of breath. 1 each 2   Benralizumab (FASENRA) 30 MG/ML SOSY INJECT 30MG  SUBCUTANEOUSLY  EVERY 8 WEEKS 1 mL 6   cyclobenzaprine (FLEXERIL) 5 MG tablet Take 1 tablet  (5 mg total) by mouth 3 (three) times daily as needed for muscle spasms. 40 tablet 2   dexlansoprazole (DEXILANT) 60 MG capsule Take 1 capsule (60 mg total) by mouth daily. 90 capsule 3   diphenoxylate-atropine (LOMOTIL) 2.5-0.025 MG tablet Take 1 tablet by mouth 4 (four) times daily as needed for diarrhea or loose stools. 30 tablet 0   EPINEPHrine 0.3 mg/0.3 mL IJ SOAJ injection INJECT AS DIRECTED FOR SEVERE ALLERGIC REACTION 2 each 1   gabapentin (NEURONTIN) 300 MG capsule Take 300 mg by mouth in the morning.     gabapentin (NEURONTIN) 600 MG tablet Take 600 mg by  mouth at bedtime.     lidocaine (LIDODERM) 5 % Place 1 patch onto the skin daily. Remove & Discard patch within 12 hours or as directed by MD 30 patch 0   meclizine (ANTIVERT) 12.5 MG tablet TAKE 1 TABLET BY MOUTH THREE TIMES DAILY AS NEEDED FOR DIZZINESS 60 tablet 0   montelukast (SINGULAIR) 10 MG tablet Take 1 tablet (10 mg total) by mouth at bedtime. (Patient taking differently: Take 10 mg by mouth in the morning.) 90 tablet 3   nitroGLYCERIN (NITROSTAT) 0.4 MG SL tablet PLACE 1 TABLET UNDER THE TONGUE EVERY 5 MINUTES AS NEEDED FOR CHEST PAIN 25 tablet 3   oxyCODONE (ROXICODONE) 5 MG immediate release tablet Take 1 tablet (5 mg total) by mouth every 8 (eight) hours as needed. 40 tablet 0   pantoprazole (PROTONIX) 40 MG tablet Take 1 tablet (40 mg total) by mouth daily. 90 tablet 3   valACYclovir (VALTREX) 1000 MG tablet Take 1 tablet by mouth twice daily 180 tablet 0   Current Facility-Administered Medications on File Prior to Visit  Medication Dose Route Frequency Provider Last Rate Last Admin   Benralizumab SOSY 30 mg  30 mg Subcutaneous Q8 weeks Alfonse Spruce, MD   30 mg at 03/07/23 1015        ROS:  All others reviewed and negative.  Objective        PE:  BP 120/72 (BP Location: Left Arm, Patient Position: Sitting, Cuff Size: Normal)   Pulse 79   Temp 98.2 F (36.8 C) (Oral)   Ht 5\' 4"  (1.626 m)   Wt 118 lb (53.5  kg)   SpO2 98%   BMI 20.25 kg/m                 Constitutional: Pt appears in NAD but mild ill appearing               HENT: Head: NCAT.                Right Ear: External ear normal.                 Left Ear: External ear normal.                Eyes: . Pupils are equal, round, and reactive to light. Conjunctivae and EOM are normal               Nose: without d/c or deformity               Neck: Neck supple. Gross normal ROM               Cardiovascular: Normal rate and regular rhythm.                 Pulmonary/Chest: Effort normal and breath sounds decreased without rales but with mild wheezing.                Abd:  Soft, NT, ND, + BS, no organomegaly               Neurological: Pt is alert. At baseline orientation, motor grossly intact               Skin: Skin is warm. No rashes, no other new lesions, LE edema - none               Psychiatric: Pt behavior is normal without agitation , nervous  Micro: none  Cardiac tracings I have personally interpreted today:  none  Pertinent Radiological findings (summarize): none   Lab Results  Component Value Date   WBC 8.3 04/09/2023   HGB 13.3 04/09/2023   HCT 40.0 04/09/2023   PLT 291.0 04/09/2023   GLUCOSE 105 (H) 04/09/2023   CHOL 286 (H) 04/09/2023   TRIG (H) 04/09/2023    549.0 Triglyceride is over 400; calculations on Lipids are invalid.   HDL 36.10 (L) 04/09/2023   LDLDIRECT 178.0 04/09/2023   LDLCALC Comment 03/12/2017   ALT 8 04/09/2023   AST 14 04/09/2023   NA 135 04/09/2023   K 3.9 04/09/2023   CL 102 04/09/2023   CREATININE 1.64 (H) 04/09/2023   BUN 14 04/09/2023   CO2 24 04/09/2023   TSH 1.42 01/10/2022   INR 0.9 08/31/2008   HGBA1C 5.3 04/09/2023   Assessment/Plan:  Lori [name] is a 67 y.o. White or Caucasian [1] female with  has a past medical history of Allergic rhinitis, Anemia, Ankle fracture (05/2016), Anxiety, Arthritis, Asthma, Barrett esophagus (2007), Chest pain, COPD (chronic obstructive pulmonary  disease) with chronic bronchitis, Depression, Duodenitis without mention of hemorrhage (2007), Esophageal reflux (2007), Esophageal stricture, Full dentures, H/O hiatal hernia, Hiatal hernia ([id number],[id number]), HSV infection, Hyperlipidemia, Multiple fractures, Pneumonia (10/2016), Restrictive lung disease (06/30/2017), and Ulcer.  COPD exacerbation (HCC) Mild to mod, for antibx course, cough med prn, depomedrol 80 mg IM, prednisone taper, for cxr, labs today including cbc bmp, to f/u any worsening symptoms or concerns  B12 deficiency Lab Results  Component Value Date   VITAMINB12 135 (L) 04/09/2023   Low, to start oral replacement - b12 1000 mcg qd   Hyperglycemia Lab Results  Component Value Date   HGBA1C 5.3 04/09/2023   Stable, pt to continue current medical treatment  - diet, wt control   Vitamin D deficiency Last vitamin D Lab Results  Component Value Date   VD25OH 13.50 (L) 04/09/2023  Low to start oral replacement   Anxiety and depression With worsening anxiety - for add celexa 20 qd Followup: Return in about 6 months (around 10/10/2023).  Oliver Barre, MD 04/09/2023 8:48 PM [facility]

## 2023-04-09 NOTE — Patient Instructions (Addendum)
You had the steroid shot today  Please take all new medication as prescribed - the celexa 20 mg per day, and antibiotic, cough medicine , and prednisone  Please continue all other medications as before, and refills have been done if requested - the xaanx  Please have the pharmacy call with any other refills you may need.  Please continue your efforts at being more active, low cholesterol diet, and weight control.  Please keep your appointments with your specialists as you may have planned  Please go to the XRAY Department in the first floor for the x-ray testing  Please go to the LAB at the blood drawing area for the tests to be done  You will be contacted by phone if any changes need to be made immediately.  Otherwise, you will receive a letter about your results with an explanation, but please check with MyChart first.  Please make an Appointment to return in 6 months, or sooner if needed

## 2023-04-09 NOTE — Assessment & Plan Note (Signed)
Last vitamin D Lab Results  Component Value Date   VD25OH 13.50 (L) 04/09/2023  Low to start oral replacement

## 2023-04-09 NOTE — Assessment & Plan Note (Signed)
Lab Results  Component Value Date   HGBA1C 5.3 04/09/2023   Stable, pt to continue current medical treatment  - diet, wt control

## 2023-04-09 NOTE — Assessment & Plan Note (Signed)
With worsening anxiety - for add celexa 20 qd

## 2023-04-09 NOTE — Assessment & Plan Note (Signed)
Lab Results  Component Value Date   VITAMINB12 135 (L) 04/09/2023   Low, to start oral replacement - b12 1000 mcg qd

## 2023-04-14 ENCOUNTER — Telehealth: Payer: Self-pay | Admitting: Internal Medicine

## 2023-04-14 NOTE — Telephone Encounter (Signed)
Patient does want another antibiotic sent in.

## 2023-04-14 NOTE — Telephone Encounter (Signed)
Ok to let pt know - there was pneumonia seen in the right lung on last cxr.  If she feels she is not improving, I can try to change the antibiotic.  Just let me know.

## 2023-04-14 NOTE — Telephone Encounter (Signed)
Pt called wanting a called back about her test results. Please advise

## 2023-04-15 MED ORDER — AMOXICILLIN-POT CLAVULANATE 875-125 MG PO TABS: 1.0000 | ORAL_TABLET | Freq: Two times a day (BID) | ORAL | 0 refills | Status: DC

## 2023-04-15 NOTE — Telephone Encounter (Signed)
Ok this is done erx 

## 2023-04-25 ENCOUNTER — Other Ambulatory Visit: Payer: Self-pay | Admitting: Internal Medicine

## 2023-04-30 ENCOUNTER — Ambulatory Visit (INDEPENDENT_AMBULATORY_CARE_PROVIDER_SITE_OTHER): Payer: 59

## 2023-04-30 ENCOUNTER — Encounter: Payer: Self-pay | Admitting: Internal Medicine

## 2023-04-30 ENCOUNTER — Ambulatory Visit (INDEPENDENT_AMBULATORY_CARE_PROVIDER_SITE_OTHER): Payer: 59 | Admitting: Internal Medicine

## 2023-04-30 VITALS — BP 112/70 | HR 74 | Temp 98.4°F | Ht 64.0 in | Wt 115.4 lb

## 2023-04-30 DIAGNOSIS — Z981 Arthrodesis status: Secondary | ICD-10-CM | POA: Diagnosis not present

## 2023-04-30 DIAGNOSIS — R062 Wheezing: Secondary | ICD-10-CM | POA: Diagnosis not present

## 2023-04-30 DIAGNOSIS — R059 Cough, unspecified: Secondary | ICD-10-CM | POA: Diagnosis not present

## 2023-04-30 DIAGNOSIS — M5441 Lumbago with sciatica, right side: Secondary | ICD-10-CM

## 2023-04-30 DIAGNOSIS — G8929 Other chronic pain: Secondary | ICD-10-CM

## 2023-04-30 DIAGNOSIS — J441 Chronic obstructive pulmonary disease with (acute) exacerbation: Secondary | ICD-10-CM

## 2023-04-30 DIAGNOSIS — E538 Deficiency of other specified B group vitamins: Secondary | ICD-10-CM

## 2023-04-30 DIAGNOSIS — M545 Low back pain, unspecified: Secondary | ICD-10-CM | POA: Diagnosis not present

## 2023-04-30 DIAGNOSIS — M542 Cervicalgia: Secondary | ICD-10-CM | POA: Diagnosis not present

## 2023-04-30 DIAGNOSIS — J439 Emphysema, unspecified: Secondary | ICD-10-CM | POA: Diagnosis not present

## 2023-04-30 DIAGNOSIS — J4489 Other specified chronic obstructive pulmonary disease: Secondary | ICD-10-CM

## 2023-04-30 DIAGNOSIS — F17210 Nicotine dependence, cigarettes, uncomplicated: Secondary | ICD-10-CM | POA: Diagnosis not present

## 2023-04-30 DIAGNOSIS — R918 Other nonspecific abnormal finding of lung field: Secondary | ICD-10-CM | POA: Diagnosis not present

## 2023-04-30 DIAGNOSIS — Z4789 Encounter for other orthopedic aftercare: Secondary | ICD-10-CM | POA: Diagnosis not present

## 2023-04-30 DIAGNOSIS — N289 Disorder of kidney and ureter, unspecified: Secondary | ICD-10-CM | POA: Diagnosis not present

## 2023-04-30 DIAGNOSIS — M47816 Spondylosis without myelopathy or radiculopathy, lumbar region: Secondary | ICD-10-CM | POA: Diagnosis not present

## 2023-04-30 LAB — BASIC METABOLIC PANEL
BUN: 11 mg/dL (ref 6–23)
CO2: 26 mEq/L (ref 19–32)
Calcium: 9.4 mg/dL (ref 8.4–10.5)
Chloride: 102 mEq/L (ref 96–112)
Creatinine, Ser: 0.97 mg/dL (ref 0.40–1.20)
GFR: 60.8 mL/min (ref >60.00)
Glucose, Bld: 76 mg/dL (ref 70–99)
Potassium: 3.9 mEq/L (ref 3.5–5.1)
Sodium: 136 mEq/L (ref 135–145)

## 2023-04-30 MED ORDER — TRAMADOL HCL 50 MG PO TABS: 50.0000 mg | ORAL_TABLET | Freq: Four times a day (QID) | ORAL | 0 refills | Status: DC | PRN

## 2023-04-30 MED ORDER — SULFAMETHOXAZOLE-TRIMETHOPRIM 800-160 MG PO TABS: 1.0000 | ORAL_TABLET | Freq: Two times a day (BID) | ORAL | 0 refills | Status: DC

## 2023-04-30 NOTE — Patient Instructions (Addendum)
Please take all new medication as prescribed  - the antibiotic  Please continue all other medications as before, as well as the tramadol for pain  Please have the pharmacy call with any other refills you may need.  Please continue your efforts at being more active, low cholesterol diet, and weight control.  Please keep your appointments with your specialists as you may have planned  Please go to the XRAY Department in the first floor for the x-ray testing  Please go to the LAB at the blood drawing area for the tests to be done  You will be contacted by phone if any changes need to be made immediately.  Otherwise, you will receive a letter about your results with an explanation, but please check with MyChart first.

## 2023-04-30 NOTE — Progress Notes (Signed)
Patient ID: Lori [name], female   DOB: 1956/08/31, 67 y.o.   MRN: [id number]        Chief Complaint: follow up copd exacerbation, ckd3a, hld, nasal infection, lbp flare, neck pain post surgury, low b12       HPI:  Lori [name] is a 67 y.o. female here after unable to tolerate doxycycline due to nausea, was some improved breathing, now worsening again with prod cough and sob.  Also has a swelling area inside the right nares.  Pt also has 2 days bilat lumbar LBP without change in severity, bowel or bladder change, fever, wt loss,  worsening LE pain/numbness/weakness, gait change or falls. Also has idl tom od worsening post neck pain with radiation to the upper back bilateral x 1 wk after hx of c spine surgury.  Still smoking, not ready to quit.        Wt Readings from Last 3 Encounters:  04/30/23 115 lb 6.4 oz (52.3 kg)  04/09/23 118 lb (53.5 kg)  01/01/23 117 lb (53.1 kg)   BP Readings from Last 3 Encounters:  04/30/23 112/70  04/09/23 120/72  01/03/23 (!) 148/89         Past Medical History:  Diagnosis Date   Allergic rhinitis    Anemia    hgb 13.9 on 11/22/21   Ankle fracture 05/2016   Anxiety    Arthritis    Asthma    Barrett esophagus 2007   Chest pain    Hx, no current problems as of 11/22/21.  Recurrent episodes.  Has had 3 negative nuclear stress test, and a completely normal coronary CT angiogram   COPD (chronic obstructive pulmonary disease) with chronic bronchitis    & Emphysema   Depression    Duodenitis without mention of hemorrhage 2007   Esophageal reflux 2007   Esophageal stricture    Full dentures    patient does not wear dentures as of 11/22/21   H/O hiatal hernia    Hiatal hernia 2006,2007   HSV infection    Hyperlipidemia    rx crestor but pt not taking   Multiple fractures    from falls, fx rt. elbow, fx left wrist, bilateral ankles   Pneumonia 10/2016   Restrictive lung disease 06/30/2017   Ulcer    no current problems as of 11/22/21   Past  Surgical History:  Procedure Laterality Date   ABDOMINAL HYSTERECTOMY     BSO   APPENDECTOMY     BIOPSY  04/04/2022   Procedure: BIOPSY;  Surgeon: Lemar Lofty., MD;  Location: [facility] ENDOSCOPY;  Service: Gastroenterology;;   CESAREAN SECTION     x 2   CHOLECYSTECTOMY     CHONDROPLASTY Left 01/06/2015   Procedure: CHONDROPLASTY;  Surgeon: Eldred Manges, MD;  Location: [facility];  Service: Orthopedics;  Laterality: Left;   COLONOSCOPY     CT CTA CORONARY W/CA SCORE W/CM &/OR WO/CM  12/02/2016   CORONARY CALCIUM SCORE 0.  NO EVIDENCE OF CAD.  Normal-sized PA.  Normal pulmonary venous drainage the left atrium.  No ASD or VSD.   ELBOW FRACTURE SURGERY     right   ESOPHAGOGASTRODUODENOSCOPY (EGD) WITH PROPOFOL N/A 04/04/2022   Procedure: ESOPHAGOGASTRODUODENOSCOPY (EGD) WITH PROPOFOL;  Surgeon: Meridee Score Netty Starring., MD;  Location: [facility] ENDOSCOPY;  Service: Gastroenterology;  Laterality: N/A;   EUS N/A 04/04/2022   Procedure: UPPER ENDOSCOPIC ULTRASOUND (EUS) RADIAL;  Surgeon: Lemar Lofty., MD;  Location: [facility] ENDOSCOPY;  Service: Gastroenterology;  Laterality: N/A;   EYE SURGERY Bilateral    cataracts removed   INSERTION OF MESH N/A 11/12/2022   Procedure: INSERTION OF MESH;  Surgeon: Axel Filler, MD;  Location: [facility] OR;  Service: General;  Laterality: N/A;   KNEE ARTHROSCOPY WITH EXCISION PLICA Left 01/06/2015   Procedure: KNEE ARTHROSCOPY WITH EXCISION PLICA;  Surgeon: Eldred Manges, MD;  Location:  SURGERY CENTER;  Service: Orthopedics;  Laterality: Left;   LIGAMENT REPAIR Right 11/28/2021   Procedure: RIGHT THUMB LIGAMENT RECONSTRUCTION AND TENDON INTERPOSITION;  Surgeon: Marlyne Beards, MD;  Location: [facility] OR;  Service: Orthopedics;  Laterality: Right;   Lower Extremity Arterial Dopplers  07/06/2013   RABI 1.0, LABI 1.1.; No evidence of significant vascular atherogenic plaque   NECK SURGERY     NM MYOVIEW LTD  09/2014   a) LOW RISK. Normal EF  of 65%, no RWMA. NO ISCHEMIA OR INFARCTION.;; b) 05/02/2009: Normal study.  LOW RISK.  No ischemia or infarction.  EF 74%.  Breast attenuation noted.   TONSILLECTOMY     TRANSTHORACIC ECHOCARDIOGRAM  03/28/2021   a) Normal EF 60 to 65%.  GR 1 DD.  No R WMA.  Normal RV size and function.  Normal RAP.  Normal valves. => NORMAL;; b) 04/23/16/2010: Normal study.  LOW RISK.  No ischemia or infarction.  EF 74%.  Breast attenuation noted.17/2010: ([facility]) EF>55%.  Normal wall motion.  Normal valves.   WRIST FRACTURE SURGERY     left, has plate   XI ROBOTIC ASSISTED PARAESOPHAGEAL HERNIA REPAIR N/A 11/12/2022   Procedure: ROBOTIC DIAPHRAGMATIC HERNIA REPAIR WITH MESH;  Surgeon: Axel Filler, MD;  Location: [facility] OR;  Service: General;  Laterality: N/A;    reports that she has been smoking cigarettes. She started smoking about 45 years ago. She has a 22.9 pack-year smoking history. She has never used smokeless tobacco. She reports that she does not drink alcohol and does not use drugs. family history includes Dementia in her mother; Diabetes in her maternal aunt, maternal grandmother, and another family member; Emphysema in her father; Heart disease in her brother. Allergies  Allergen Reactions   Advil [Ibuprofen] Nausea And Vomiting   Bee Venom Swelling   Codeine Nausea Only and Other (See Comments)    CAUSES ULCERS   Clonidine Derivatives Other (See Comments)    Unknown    Doxycycline Other (See Comments)    nausea   Statins Other (See Comments)    unknown   Latex Rash   Levaquin [Levofloxacin] Nausea Only   Propoxyphene N-Acetaminophen Nausea Only   Current Outpatient Medications on File Prior to Visit  Medication Sig Dispense Refill   albuterol (PROAIR HFA) 108 (90 Base) MCG/ACT inhaler Inhale 2 puffs into the lungs every 6 (six) hours as needed for wheezing or shortness of breath. 1 each 2   ALPRAZolam (XANAX) 1 MG tablet TAKE 1 TABLET BY MOUTH AT BEDTIME AS NEEDED FOR ANXIETY 90 tablet 1    Benralizumab (FASENRA) 30 MG/ML SOSY INJECT 30MG  SUBCUTANEOUSLY  EVERY 8 WEEKS 1 mL 6   citalopram (CELEXA) 20 MG tablet Take 1 tablet (20 mg total) by mouth daily. 90 tablet 3   cyclobenzaprine (FLEXERIL) 5 MG tablet Take 1 tablet (5 mg total) by mouth 3 (three) times daily as needed for muscle spasms. 40 tablet 2   dexlansoprazole (DEXILANT) 60 MG capsule Take 1 capsule (60 mg total) by mouth daily. 90 capsule 3   EPINEPHrine 0.3 mg/0.3 mL IJ SOAJ injection INJECT AS DIRECTED  FOR SEVERE ALLERGIC REACTION 2 each 1   gabapentin (NEURONTIN) 600 MG tablet Take 600 mg by mouth at bedtime.     meclizine (ANTIVERT) 12.5 MG tablet TAKE 1 TABLET BY MOUTH THREE TIMES DAILY AS NEEDED FOR DIZZINESS 60 tablet 0   montelukast (SINGULAIR) 10 MG tablet Take 1 tablet (10 mg total) by mouth at bedtime. (Patient taking differently: Take 10 mg by mouth in the morning.) 90 tablet 3   nitroGLYCERIN (NITROSTAT) 0.4 MG SL tablet PLACE 1 TABLET UNDER THE TONGUE EVERY 5 MINUTES AS NEEDED FOR CHEST PAIN 25 tablet 3   pantoprazole (PROTONIX) 40 MG tablet Take 1 tablet (40 mg total) by mouth daily. 90 tablet 3   predniSONE (DELTASONE) 10 MG tablet 3 tabs by mouth per day for 3 days,2tabs per day for 3 days,1tab per day for 3 days 18 tablet 0   valACYclovir (VALTREX) 1000 MG tablet Take 1 tablet by mouth twice daily 180 tablet 0   Current Facility-Administered Medications on File Prior to Visit  Medication Dose Route Frequency Provider Last Rate Last Admin   Benralizumab SOSY 30 mg  30 mg Subcutaneous Q8 weeks Alfonse Spruce, MD   30 mg at 05/02/23 0920        ROS:  All others reviewed and negative.  Objective        PE:  BP 112/70 (BP Location: Right Arm, Patient Position: Sitting, Cuff Size: Normal)   Pulse 74   Temp 98.4 F (36.9 C) (Oral)   Ht 5\' 4"  (1.626 m)   Wt 115 lb 6.4 oz (52.3 kg)   SpO2 99%   BMI 19.81 kg/m                 Constitutional: Pt appears in NAD but mild ill               HENT:  Head: NCAT.                Right Ear: External ear normal.                 Left Ear: External ear normal. Bilat tm's with mild erythema.  Max sinus areas non tender.  Pharynx with mild erythema, no exudate                Eyes: . Pupils are equal, round, and reactive to light. Conjunctivae and EOM are normal               Nose: without d/c or deformity               Neck: Neck supple. Gross normal ROM               Cardiovascular: Normal rate and regular rhythm.                 Pulmonary/Chest: Effort normal and breath sounds decresaed without rales or wheezing.                Abd:  Soft, NT, ND, + BS, no organomegaly               Neurological: Pt is alert. At baseline orientation, motor grossly intact               Skin: Skin is warm. No rashes, no other new lesions, LE edema - none, has mod tender bilatera lumbar paravertebral and cervical spasm as well  Psychiatric: Pt behavior is normal without agitation   Micro: none  Cardiac tracings I have personally interpreted today:  none  Pertinent Radiological findings (summarize): none   Lab Results  Component Value Date   WBC 8.3 04/09/2023   HGB 13.3 04/09/2023   HCT 40.0 04/09/2023   PLT 291.0 04/09/2023   GLUCOSE 76 04/30/2023   CHOL 286 (H) 04/09/2023   TRIG (H) 04/09/2023    549.0 Triglyceride is over 400; calculations on Lipids are invalid.   HDL 36.10 (L) 04/09/2023   LDLDIRECT 178.0 04/09/2023   LDLCALC Comment 03/12/2017   ALT 8 04/09/2023   AST 14 04/09/2023   NA 136 04/30/2023   K 3.9 04/30/2023   CL 102 04/30/2023   CREATININE 0.97 04/30/2023   BUN 11 04/30/2023   CO2 26 04/30/2023   TSH 1.42 01/10/2022   INR 0.9 08/31/2008   HGBA1C 5.3 04/09/2023   Assessment/Plan:  Lori [name] is a 67 y.o. White or Caucasian [1] female with  has a past medical history of Allergic rhinitis, Anemia, Ankle fracture (05/2016), Anxiety, Arthritis, Asthma, Barrett esophagus (2007), Chest pain, COPD (chronic  obstructive pulmonary disease) with chronic bronchitis, Depression, Duodenitis without mention of hemorrhage (2007), Esophageal reflux (2007), Esophageal stricture, Full dentures, H/O hiatal hernia, Hiatal hernia (1610,9604), HSV infection, Hyperlipidemia, Multiple fractures, Pneumonia (10/2016), Restrictive lung disease (06/30/2017), and Ulcer.  Chronic low back pain with sciatica With acute on chronic flare - for tramadol prn  Cigarette smoker Pt counsled to quit, pt no ready  Asthma-COPD overlap syndrome (HCC) With mild worsening per pt, for septra ds bid  B12 deficiency Lab Results  Component Value Date   VITAMINB12 135 (L) 04/09/2023   Low, to start oral replacement - b12 1000 mcg qd   COPD exacerbation (HCC) Also for cxr  Neck pain Also for neck xrays  Low back pain Also for lumbar films per pt request  Followup: Return if symptoms worsen or fail to improve.  Oliver Barre, MD 05/03/2023 7:59 PM [facility]

## 2023-05-02 ENCOUNTER — Ambulatory Visit (INDEPENDENT_AMBULATORY_CARE_PROVIDER_SITE_OTHER): Payer: 59

## 2023-05-02 DIAGNOSIS — J4550 Severe persistent asthma, uncomplicated: Secondary | ICD-10-CM | POA: Diagnosis not present

## 2023-05-03 ENCOUNTER — Encounter: Payer: Self-pay | Admitting: Internal Medicine

## 2023-05-03 NOTE — Assessment & Plan Note (Signed)
Also for cxr

## 2023-05-03 NOTE — Assessment & Plan Note (Signed)
With acute on chronic flare - for tramadol prn

## 2023-05-03 NOTE — Assessment & Plan Note (Signed)
With mild worsening per pt, for septra ds bid

## 2023-05-03 NOTE — Assessment & Plan Note (Signed)
Also for neck xrays

## 2023-05-03 NOTE — Assessment & Plan Note (Signed)
Also for lumbar films per pt request

## 2023-05-03 NOTE — Assessment & Plan Note (Signed)
Pt counsled to quit, pt no ready

## 2023-05-03 NOTE — Assessment & Plan Note (Signed)
 Lab Results  Component Value Date   VITAMINB12 135 (L) 04/09/2023   Low, to start oral replacement - b12 1000 mcg qd

## 2023-05-05 ENCOUNTER — Telehealth: Payer: Self-pay | Admitting: Internal Medicine

## 2023-05-05 NOTE — Telephone Encounter (Signed)
Pt called wanting results I read the results pt had more questions advised a nurse will call the pt back.

## 2023-05-06 ENCOUNTER — Ambulatory Visit (INDEPENDENT_AMBULATORY_CARE_PROVIDER_SITE_OTHER): Payer: 59 | Admitting: *Deleted

## 2023-05-06 DIAGNOSIS — J309 Allergic rhinitis, unspecified: Secondary | ICD-10-CM

## 2023-05-06 NOTE — Telephone Encounter (Signed)
Called and left voice mail

## 2023-05-09 ENCOUNTER — Encounter: Payer: Self-pay | Admitting: Internal Medicine

## 2023-05-13 ENCOUNTER — Emergency Department (HOSPITAL_COMMUNITY): Payer: 59

## 2023-05-13 ENCOUNTER — Other Ambulatory Visit: Payer: Self-pay

## 2023-05-13 ENCOUNTER — Inpatient Hospital Stay (HOSPITAL_COMMUNITY)
Admission: EM | Admit: 2023-05-13 | Discharge: 2023-05-19 | DRG: 194 | Disposition: A | Discharge status: Home / Self Care | Payer: 59 | Attending: Internal Medicine | Admitting: Internal Medicine

## 2023-05-13 ENCOUNTER — Encounter (HOSPITAL_COMMUNITY): Payer: Self-pay

## 2023-05-13 DIAGNOSIS — Z825 Family history of asthma and other chronic lower respiratory diseases: Secondary | ICD-10-CM | POA: Diagnosis not present

## 2023-05-13 DIAGNOSIS — Z79899 Other long term (current) drug therapy: Secondary | ICD-10-CM | POA: Diagnosis not present

## 2023-05-13 DIAGNOSIS — J440 Chronic obstructive pulmonary disease with acute lower respiratory infection: Secondary | ICD-10-CM | POA: Diagnosis present

## 2023-05-13 DIAGNOSIS — Z833 Family history of diabetes mellitus: Secondary | ICD-10-CM | POA: Diagnosis not present

## 2023-05-13 DIAGNOSIS — Z888 Allergy status to other drugs, medicaments and biological substances status: Secondary | ICD-10-CM | POA: Diagnosis not present

## 2023-05-13 DIAGNOSIS — Z91030 Bee allergy status: Secondary | ICD-10-CM

## 2023-05-13 DIAGNOSIS — J441 Chronic obstructive pulmonary disease with (acute) exacerbation: Secondary | ICD-10-CM | POA: Diagnosis present

## 2023-05-13 DIAGNOSIS — E785 Hyperlipidemia, unspecified: Secondary | ICD-10-CM | POA: Diagnosis present

## 2023-05-13 DIAGNOSIS — R079 Chest pain, unspecified: Secondary | ICD-10-CM | POA: Diagnosis not present

## 2023-05-13 DIAGNOSIS — J439 Emphysema, unspecified: Secondary | ICD-10-CM | POA: Diagnosis present

## 2023-05-13 DIAGNOSIS — D649 Anemia, unspecified: Secondary | ICD-10-CM | POA: Diagnosis not present

## 2023-05-13 DIAGNOSIS — K2270 Barrett's esophagus without dysplasia: Secondary | ICD-10-CM | POA: Diagnosis not present

## 2023-05-13 DIAGNOSIS — K219 Gastro-esophageal reflux disease without esophagitis: Secondary | ICD-10-CM | POA: Diagnosis present

## 2023-05-13 DIAGNOSIS — G43719 Chronic migraine without aura, intractable, without status migrainosus: Secondary | ICD-10-CM | POA: Diagnosis not present

## 2023-05-13 DIAGNOSIS — F17210 Nicotine dependence, cigarettes, uncomplicated: Secondary | ICD-10-CM | POA: Diagnosis present

## 2023-05-13 DIAGNOSIS — Z885 Allergy status to narcotic agent status: Secondary | ICD-10-CM | POA: Diagnosis not present

## 2023-05-13 DIAGNOSIS — R059 Cough, unspecified: Secondary | ICD-10-CM | POA: Diagnosis not present

## 2023-05-13 DIAGNOSIS — J189 Pneumonia, unspecified organism: Principal | ICD-10-CM | POA: Diagnosis present

## 2023-05-13 DIAGNOSIS — Z881 Allergy status to other antibiotic agents status: Secondary | ICD-10-CM

## 2023-05-13 DIAGNOSIS — F32A Depression, unspecified: Secondary | ICD-10-CM | POA: Diagnosis present

## 2023-05-13 DIAGNOSIS — Z8249 Family history of ischemic heart disease and other diseases of the circulatory system: Secondary | ICD-10-CM

## 2023-05-13 DIAGNOSIS — Z720 Tobacco use: Secondary | ICD-10-CM | POA: Diagnosis present

## 2023-05-13 DIAGNOSIS — Z886 Allergy status to analgesic agent status: Secondary | ICD-10-CM | POA: Diagnosis not present

## 2023-05-13 DIAGNOSIS — J9811 Atelectasis: Secondary | ICD-10-CM | POA: Diagnosis not present

## 2023-05-13 DIAGNOSIS — R0602 Shortness of breath: Secondary | ICD-10-CM | POA: Diagnosis not present

## 2023-05-13 DIAGNOSIS — Z91040 Latex allergy status: Secondary | ICD-10-CM

## 2023-05-13 DIAGNOSIS — R062 Wheezing: Secondary | ICD-10-CM | POA: Diagnosis not present

## 2023-05-13 DIAGNOSIS — R918 Other nonspecific abnormal finding of lung field: Secondary | ICD-10-CM | POA: Diagnosis not present

## 2023-05-13 DIAGNOSIS — F419 Anxiety disorder, unspecified: Secondary | ICD-10-CM | POA: Diagnosis present

## 2023-05-13 DIAGNOSIS — Z743 Need for continuous supervision: Secondary | ICD-10-CM | POA: Diagnosis not present

## 2023-05-13 DIAGNOSIS — Z90710 Acquired absence of both cervix and uterus: Secondary | ICD-10-CM

## 2023-05-13 LAB — BASIC METABOLIC PANEL
Anion gap: 9 (ref 5–15)
BUN: 10 mg/dL (ref 8–23)
CO2: 18 mmol/L — ABNORMAL LOW (ref 22–32)
Calcium: 8.5 mg/dL — ABNORMAL LOW (ref 8.9–10.3)
Chloride: 110 mmol/L (ref 98–111)
Creatinine, Ser: 0.87 mg/dL (ref 0.44–1.00)
GFR, Estimated: >60 mL/min (ref >60)
Glucose, Bld: 146 mg/dL — ABNORMAL HIGH (ref 70–99)
Potassium: 3.5 mmol/L (ref 3.5–5.1)
Sodium: 137 mmol/L (ref 135–145)

## 2023-05-13 LAB — CBC
HCT: 33.4 % — ABNORMAL LOW (ref 36.0–46.0)
Hemoglobin: 11.6 g/dL — ABNORMAL LOW (ref 12.0–15.0)
MCH: 34.1 pg — ABNORMAL HIGH (ref 26.0–34.0)
MCHC: 34.7 g/dL (ref 30.0–36.0)
MCV: 98.2 fL (ref 80.0–100.0)
Platelets: 186 10*3/uL (ref 150–400)
RBC: 3.4 MIL/uL — ABNORMAL LOW (ref 3.87–5.11)
RDW: 13.4 % (ref 11.5–15.5)
WBC: 6.6 10*3/uL (ref 4.0–10.5)
nRBC: 0 % (ref 0.0–0.2)

## 2023-05-13 LAB — TROPONIN I (HIGH SENSITIVITY): Troponin I (High Sensitivity): 2 ng/L (ref <18)

## 2023-05-13 NOTE — ED Provider Triage Note (Signed)
Emergency Medicine Provider Triage Evaluation Note  Lori [name] , a 67 y.o. female  was evaluated in triage.  Pt complains of chest pain and SOB. Hx of asthma. On EMS arrival patient was wheezing, given duoneb, solumedrol and IVF. Not much relief. Reported congestion x 6 weeks.   Review of Systems  Positive: CP, SOB, cough, wheezing Negative:   Physical Exam  There were no vitals taken for this visit. Gen:   Awake, no distress   Resp:  Normal effort  MSK:   Moves extremities without difficulty  Other:    Medical Decision Making  Medically screening exam initiated at 6:20 PM.  Appropriate orders placed.  Lori [name] was informed that the remainder of the evaluation will be completed by another provider, this initial triage assessment does not replace that evaluation, and the importance of remaining in the ED until their evaluation is complete.  Workup initiaited   Lori [name] T, PA-C 05/13/23 1821

## 2023-05-13 NOTE — ED Triage Notes (Signed)
BIBA called out for chest pain and shortness of breath. Hx of asthma. Has been sick for 6 weeks with respiratory symptoms. States on arrival patient was wheezing. Patient was given solumedrol and duoneb given in route. Not much improve. 22 R hand est by EMS

## 2023-05-14 ENCOUNTER — Emergency Department (HOSPITAL_COMMUNITY): Payer: 59

## 2023-05-14 ENCOUNTER — Encounter (HOSPITAL_COMMUNITY): Payer: Self-pay

## 2023-05-14 DIAGNOSIS — J189 Pneumonia, unspecified organism: Secondary | ICD-10-CM | POA: Diagnosis present

## 2023-05-14 LAB — HEPATIC FUNCTION PANEL
ALT: 15 U/L (ref 0–44)
AST: 20 U/L (ref 15–41)
Albumin: 4.2 g/dL (ref 3.5–5.0)
Alkaline Phosphatase: 70 U/L (ref 38–126)
Bilirubin, Direct: <0.1 mg/dL (ref 0.0–0.2)
Total Bilirubin: 0.5 mg/dL (ref 0.3–1.2)
Total Protein: 7 g/dL (ref 6.5–8.1)

## 2023-05-14 LAB — LIPASE, BLOOD: Lipase: 32 U/L (ref 11–51)

## 2023-05-14 LAB — TROPONIN I (HIGH SENSITIVITY): Troponin I (High Sensitivity): 2 ng/L (ref <18)

## 2023-05-14 LAB — MAGNESIUM: Magnesium: 1.6 mg/dL — ABNORMAL LOW (ref 1.7–2.4)

## 2023-05-14 MED ORDER — FENTANYL CITRATE PF 50 MCG/ML IJ SOSY: 50.0000 ug | PREFILLED_SYRINGE | Freq: Once | INTRAMUSCULAR | Status: DC

## 2023-05-14 MED ORDER — SODIUM CHLORIDE (PF) 0.9 % IJ SOLN
INTRAMUSCULAR | Status: AC
  Filled 2023-05-14: qty 50

## 2023-05-14 MED ORDER — HYDROCODONE-ACETAMINOPHEN 5-325 MG PO TABS
1.0000 | ORAL_TABLET | Freq: Once | ORAL | Status: AC
  Administered 2023-05-14: 1 via ORAL
  Filled 2023-05-14: qty 1

## 2023-05-14 MED ORDER — IPRATROPIUM-ALBUTEROL 0.5-2.5 (3) MG/3ML IN SOLN
3.0000 mL | Freq: Three times a day (TID) | RESPIRATORY_TRACT | Status: DC
  Administered 2023-05-14 – 2023-05-19 (×15): 3 mL via RESPIRATORY_TRACT
  Filled 2023-05-14 (×15): qty 3

## 2023-05-14 MED ORDER — ONDANSETRON HCL 4 MG/2ML IJ SOLN: 4.0000 mg | Freq: Four times a day (QID) | INTRAMUSCULAR | Status: DC | PRN

## 2023-05-14 MED ORDER — NICOTINE 14 MG/24HR TD PT24
14.0000 mg | MEDICATED_PATCH | Freq: Every day | TRANSDERMAL | Status: DC
  Administered 2023-05-14 – 2023-05-19 (×6): 14 mg via TRANSDERMAL
  Filled 2023-05-14 (×6): qty 1

## 2023-05-14 MED ORDER — MAGNESIUM SULFATE 2 GM/50ML IV SOLN
2.0000 g | Freq: Once | INTRAVENOUS | Status: AC
  Administered 2023-05-14: 2 g via INTRAVENOUS
  Filled 2023-05-14: qty 50

## 2023-05-14 MED ORDER — ALBUTEROL SULFATE (2.5 MG/3ML) 0.083% IN NEBU: 2.5000 mg | INHALATION_SOLUTION | RESPIRATORY_TRACT | Status: DC | PRN

## 2023-05-14 MED ORDER — HYDROMORPHONE HCL 1 MG/ML IJ SOLN: 0.5000 mg | INTRAMUSCULAR | Status: DC | PRN

## 2023-05-14 MED ORDER — METHYLPREDNISOLONE SODIUM SUCC 40 MG IJ SOLR
40.0000 mg | Freq: Two times a day (BID) | INTRAMUSCULAR | Status: DC
  Administered 2023-05-14 – 2023-05-19 (×11): 40 mg via INTRAVENOUS
  Filled 2023-05-14 (×11): qty 1

## 2023-05-14 MED ORDER — TRAMADOL HCL 50 MG PO TABS
50.0000 mg | ORAL_TABLET | Freq: Four times a day (QID) | ORAL | Status: DC | PRN
  Administered 2023-05-14 – 2023-05-15 (×3): 50 mg via ORAL
  Filled 2023-05-14 (×3): qty 1

## 2023-05-14 MED ORDER — GABAPENTIN 300 MG PO CAPS
600.0000 mg | ORAL_CAPSULE | Freq: Two times a day (BID) | ORAL | Status: DC
  Administered 2023-05-14 – 2023-05-19 (×11): 600 mg via ORAL
  Filled 2023-05-14 (×11): qty 2

## 2023-05-14 MED ORDER — KETOROLAC TROMETHAMINE 30 MG/ML IJ SOLN: 30.0000 mg | Freq: Four times a day (QID) | INTRAMUSCULAR | Status: AC | PRN

## 2023-05-14 MED ORDER — ENOXAPARIN SODIUM 40 MG/0.4ML IJ SOSY
40.0000 mg | PREFILLED_SYRINGE | INTRAMUSCULAR | Status: DC
  Administered 2023-05-14 – 2023-05-19 (×6): 40 mg via SUBCUTANEOUS
  Filled 2023-05-14 (×6): qty 0.4

## 2023-05-14 MED ORDER — SENNOSIDES-DOCUSATE SODIUM 8.6-50 MG PO TABS: 1.0000 | ORAL_TABLET | Freq: Every evening | ORAL | Status: DC | PRN

## 2023-05-14 MED ORDER — HYDROMORPHONE HCL 1 MG/ML IJ SOLN
0.5000 mg | Freq: Once | INTRAMUSCULAR | Status: AC
  Administered 2023-05-14: 0.5 mg via INTRAVENOUS
  Filled 2023-05-14: qty 1

## 2023-05-14 MED ORDER — ALPRAZOLAM 0.5 MG PO TABS
1.0000 mg | ORAL_TABLET | Freq: Every evening | ORAL | Status: DC | PRN
  Administered 2023-05-14 – 2023-05-17 (×5): 1 mg via ORAL
  Filled 2023-05-14 (×5): qty 2

## 2023-05-14 MED ORDER — SODIUM CHLORIDE 0.9 % IV SOLN
1.0000 g | Freq: Once | INTRAVENOUS | Status: AC
  Administered 2023-05-14: 1 g via INTRAVENOUS
  Filled 2023-05-14: qty 10

## 2023-05-14 MED ORDER — PANTOPRAZOLE SODIUM 40 MG PO TBEC
40.0000 mg | DELAYED_RELEASE_TABLET | Freq: Every day | ORAL | Status: DC
  Administered 2023-05-14 – 2023-05-19 (×6): 40 mg via ORAL
  Filled 2023-05-14 (×6): qty 1

## 2023-05-14 MED ORDER — SODIUM CHLORIDE 0.9 % IV SOLN
1.0000 g | INTRAVENOUS | Status: DC
  Administered 2023-05-15 – 2023-05-19 (×5): 1 g via INTRAVENOUS
  Filled 2023-05-14 (×5): qty 10

## 2023-05-14 MED ORDER — IOHEXOL 350 MG/ML SOLN
75.0000 mL | Freq: Once | INTRAVENOUS | Status: AC | PRN
  Administered 2023-05-14: 75 mL via INTRAVENOUS

## 2023-05-14 MED ORDER — IPRATROPIUM-ALBUTEROL 0.5-2.5 (3) MG/3ML IN SOLN
3.0000 mL | Freq: Once | RESPIRATORY_TRACT | Status: AC
  Administered 2023-05-14: 3 mL via RESPIRATORY_TRACT
  Filled 2023-05-14: qty 3

## 2023-05-14 MED ORDER — VALACYCLOVIR HCL 500 MG PO TABS
1000.0000 mg | ORAL_TABLET | Freq: Two times a day (BID) | ORAL | Status: DC
  Administered 2023-05-14 – 2023-05-19 (×11): 1000 mg via ORAL
  Filled 2023-05-14 (×11): qty 2

## 2023-05-14 MED ORDER — SODIUM CHLORIDE 0.9 % IV SOLN
500.0000 mg | INTRAVENOUS | Status: DC
  Administered 2023-05-15 – 2023-05-16 (×2): 500 mg via INTRAVENOUS
  Filled 2023-05-14 (×2): qty 5

## 2023-05-14 MED ORDER — ALBUTEROL SULFATE (2.5 MG/3ML) 0.083% IN NEBU
2.5000 mg | INHALATION_SOLUTION | RESPIRATORY_TRACT | Status: DC | PRN
  Administered 2023-05-14: 2.5 mg via RESPIRATORY_TRACT
  Filled 2023-05-14: qty 3

## 2023-05-14 MED ORDER — CITALOPRAM HYDROBROMIDE 20 MG PO TABS
20.0000 mg | ORAL_TABLET | Freq: Every day | ORAL | Status: DC
  Administered 2023-05-14 – 2023-05-19 (×6): 20 mg via ORAL
  Filled 2023-05-14 (×2): qty 1
  Filled 2023-05-14: qty 2
  Filled 2023-05-14 (×3): qty 1

## 2023-05-14 MED ORDER — CYCLOBENZAPRINE HCL 5 MG PO TABS
5.0000 mg | ORAL_TABLET | Freq: Three times a day (TID) | ORAL | Status: DC | PRN
  Administered 2023-05-15 – 2023-05-17 (×3): 5 mg via ORAL
  Filled 2023-05-14 (×3): qty 1

## 2023-05-14 MED ORDER — ALBUTEROL SULFATE (2.5 MG/3ML) 0.083% IN NEBU
2.5000 mg | INHALATION_SOLUTION | RESPIRATORY_TRACT | Status: DC
  Administered 2023-05-14: 2.5 mg via RESPIRATORY_TRACT
  Filled 2023-05-14: qty 3

## 2023-05-14 MED ORDER — GUAIFENESIN 100 MG/5ML PO LIQD
5.0000 mL | ORAL | Status: DC | PRN
  Administered 2023-05-14 – 2023-05-19 (×6): 5 mL via ORAL
  Filled 2023-05-14 (×6): qty 10

## 2023-05-14 MED ORDER — ONDANSETRON HCL 4 MG/2ML IJ SOLN
4.0000 mg | Freq: Once | INTRAMUSCULAR | Status: AC
  Administered 2023-05-14: 4 mg via INTRAVENOUS
  Filled 2023-05-14: qty 2

## 2023-05-14 MED ORDER — IPRATROPIUM-ALBUTEROL 0.5-2.5 (3) MG/3ML IN SOLN
3.0000 mL | Freq: Four times a day (QID) | RESPIRATORY_TRACT | Status: DC
  Administered 2023-05-14: 3 mL via RESPIRATORY_TRACT
  Filled 2023-05-14: qty 3

## 2023-05-14 MED ORDER — MONTELUKAST SODIUM 10 MG PO TABS
10.0000 mg | ORAL_TABLET | Freq: Every day | ORAL | Status: DC
  Administered 2023-05-14 – 2023-05-19 (×6): 10 mg via ORAL
  Filled 2023-05-14 (×6): qty 1

## 2023-05-14 MED ORDER — SODIUM CHLORIDE 0.9 % IV SOLN
500.0000 mg | Freq: Once | INTRAVENOUS | Status: AC
  Administered 2023-05-14: 500 mg via INTRAVENOUS
  Filled 2023-05-14: qty 5

## 2023-05-14 NOTE — ED Provider Notes (Signed)
[place] EMERGENCY DEPARTMENT AT [facility] Provider Note   CSN: [id number] Arrival date & time: 05/13/23  1815     History  Chief Complaint  Patient presents with   Shortness of Breath   Chest Pain    Lori [name] is a 67 y.o. female.  Patient with a history of COPD, asthma, hiatal hernia, hyperlipidemia presenting with "6 weeks" difficulty breathing and coughing.  She states she has been using her albuterol at home without relief and has a nonproductive yellowish cough worsening in the past week.  EMS gave her Solu-Medrol and DuoNeb now she is feeling better.  Reports no recent asthma hospitalizations.  Has not been on steroids recently.  Has not had a fever.  She is having pain to her right chest after hitting it on a garbage can yesterday.  She reports a "pop" below her right breast that comes and goes lasting for few seconds at a time.  Has had happened multiple times today and is worse with palpation and movement.  No leg pain or leg swelling.  Feeling improved after waiting for 6 hours in the waiting room.  The history is provided by the patient.  Shortness of Breath Associated symptoms: chest pain   Associated symptoms: no abdominal pain, no fever, no headaches, no rash and no vomiting   Chest Pain Associated symptoms: shortness of breath   Associated symptoms: no abdominal pain, no dizziness, no fever, no headache, no nausea, no vomiting and no weakness        Home Medications Prior to Admission medications   Medication Sig Start Date End Date Taking? Authorizing Provider  albuterol (PROAIR HFA) 108 (90 Base) MCG/ACT inhaler Inhale 2 puffs into the lungs every 6 (six) hours as needed for wheezing or shortness of breath. 12/27/22   Corwin Levins, MD  ALPRAZolam Prudy Feeler) 1 MG tablet TAKE 1 TABLET BY MOUTH AT BEDTIME AS NEEDED FOR ANXIETY 04/09/23   Corwin Levins, MD  Benralizumab [facility]) 30 MG/ML SOSY INJECT 30MG  SUBCUTANEOUSLY  EVERY 8 WEEKS 09/12/22    Alfonse Spruce, MD  citalopram (CELEXA) 20 MG tablet Take 1 tablet (20 mg total) by mouth daily. 04/09/23   Corwin Levins, MD  cyclobenzaprine (FLEXERIL) 5 MG tablet Take 1 tablet (5 mg total) by mouth 3 (three) times daily as needed for muscle spasms. 02/11/23   Corwin Levins, MD  dexlansoprazole (DEXILANT) 60 MG capsule Take 1 capsule (60 mg total) by mouth daily. 06/25/22   Corwin Levins, MD  EPINEPHrine 0.3 mg/0.3 mL IJ SOAJ injection INJECT AS DIRECTED FOR SEVERE ALLERGIC REACTION 09/25/22   Alfonse Spruce, MD  gabapentin (NEURONTIN) 600 MG tablet Take 600 mg by mouth at bedtime. 04/30/22   [provider]  meclizine (ANTIVERT) 12.5 MG tablet TAKE 1 TABLET BY MOUTH THREE TIMES DAILY AS NEEDED FOR DIZZINESS 03/17/23   Corwin Levins, MD  montelukast (SINGULAIR) 10 MG tablet Take 1 tablet (10 mg total) by mouth at bedtime. Patient taking differently: Take 10 mg by mouth in the morning. 06/25/22   Corwin Levins, MD  nitroGLYCERIN (NITROSTAT) 0.4 MG SL tablet PLACE 1 TABLET UNDER THE TONGUE EVERY 5 MINUTES AS NEEDED FOR CHEST PAIN 07/16/22   [name], MD  pantoprazole (PROTONIX) 40 MG tablet Take 1 tablet (40 mg total) by mouth daily. 01/01/23   Corwin Levins, MD  predniSONE (DELTASONE) 10 MG tablet 3 tabs by mouth per day for 3 days,2tabs  per day for 3 days,1tab per day for 3 days 04/09/23   Corwin Levins, MD  sulfamethoxazole-trimethoprim (BACTRIM DS) 800-160 MG tablet Take 1 tablet by mouth 2 (two) times daily. 04/30/23   Corwin Levins, MD  traMADol (ULTRAM) 50 MG tablet Take 1 tablet (50 mg total) by mouth every 6 (six) hours as needed. 04/30/23   Corwin Levins, MD  valACYclovir Ralph Dowdy) 1000 MG tablet Take 1 tablet by mouth twice daily 04/25/23   Corwin Levins, MD      Allergies    Advil [ibuprofen], Bee venom, Codeine, Clonidine derivatives, Doxycycline, Statins, Latex, Levaquin [levofloxacin], and Propoxyphene n-acetaminophen    Review of Systems   Review of Systems   Constitutional:  Negative for activity change, appetite change and fever.  HENT:  Negative for congestion and rhinorrhea.   Respiratory:  Positive for shortness of breath.   Cardiovascular:  Positive for chest pain.  Gastrointestinal:  Negative for abdominal pain, nausea and vomiting.  Genitourinary:  Negative for dysuria and hematuria.  Musculoskeletal:  Positive for arthralgias and myalgias.  Skin:  Negative for rash.  Neurological:  Negative for dizziness, weakness and headaches.   all other systems are negative except as noted in the HPI and PMH.    Physical Exam Updated Vital Signs BP 101/74   Pulse 88   Temp (!) 97.1 F (36.2 C) (Oral)   Resp (!) 22   Ht 5\' 4"  (1.626 m)   Wt 53.5 kg   SpO2 99%   BMI 20.25 kg/m  Physical Exam Vitals and nursing note reviewed.  Constitutional:      General: She is not in acute distress.    Appearance: She is well-developed.     Comments: Speaking in full sentences, no distress  HENT:     Head: Normocephalic and atraumatic.     Mouth/Throat:     Pharynx: No oropharyngeal exudate.  Eyes:     Conjunctiva/sclera: Conjunctivae normal.     Pupils: Pupils are equal, round, and reactive to light.  Neck:     Comments: No meningismus. Cardiovascular:     Rate and Rhythm: Normal rate and regular rhythm.     Heart sounds: Normal heart sounds. No murmur heard. Pulmonary:     Effort: Pulmonary effort is normal. No respiratory distress.     Breath sounds: Normal breath sounds.     Comments: Tenderness below right ribs worse with palpation.  No crepitus. Chest:     Chest wall: Tenderness present.  Abdominal:     Palpations: Abdomen is soft.     Tenderness: There is no abdominal tenderness. There is no guarding or rebound.  Musculoskeletal:        General: No tenderness. Normal range of motion.     Cervical back: Normal range of motion and neck supple.  Skin:    General: Skin is warm.  Neurological:     Mental Status: She is alert and  oriented to person, place, and time.     Cranial Nerves: No cranial nerve deficit.     Motor: No abnormal muscle tone.     Coordination: Coordination normal.     Comments:  5/5 strength throughout. CN 2-12 intact.Equal grip strength.   Psychiatric:        Behavior: Behavior normal.     ED Results / Procedures / Treatments   Labs (all labs ordered are listed, but only abnormal results are displayed) Labs Reviewed  CBC - Abnormal; Notable for the following components:  Result Value   RBC 3.40 (*)    Hemoglobin 11.6 (*)    HCT 33.4 (*)    MCH 34.1 (*)    All other components within normal limits  BASIC METABOLIC PANEL - Abnormal; Notable for the following components:   CO2 18 (*)    Glucose, Bld 146 (*)    Calcium 8.5 (*)    All other components within normal limits  MAGNESIUM - Abnormal; Notable for the following components:   Magnesium 1.6 (*)    All other components within normal limits  CULTURE, BLOOD (ROUTINE X 2)  CULTURE, BLOOD (ROUTINE X 2)  HEPATIC FUNCTION PANEL  LIPASE, BLOOD  TROPONIN I (HIGH SENSITIVITY)  TROPONIN I (HIGH SENSITIVITY)    EKG EKG Interpretation Date/Time:  Tuesday May 13 2023 18:55:19 EDT Ventricular Rate:  90 PR Interval:  121 QRS Duration:  79 QT Interval:  339 QTC Calculation: 415 R Axis:   48  Text Interpretation: Sinus rhythm Low voltage, precordial leads No significant change was found Confirmed by Glynn Octave [phone]) on 05/14/2023 12:33:42 AM  Radiology CT Angio Chest PE W and/or Wo Contrast  Result Date: 05/14/2023 CLINICAL DATA:  Chest pain and shortness of breath. Concern for pulmonary embolism. EXAM: CT ANGIOGRAPHY CHEST WITH CONTRAST TECHNIQUE: Multidetector CT imaging of the chest was performed using the standard protocol during bolus administration of intravenous contrast. Multiplanar CT image reconstructions and MIPs were obtained to evaluate the vascular anatomy. RADIATION DOSE REDUCTION: This exam was performed  according to the departmental dose-optimization program which includes automated exposure control, adjustment of the mA and/or kV according to patient size and/or use of iterative reconstruction technique. CONTRAST:  75mL OMNIPAQUE IOHEXOL 350 MG/ML SOLN COMPARISON:  Chest radiograph dated 05/13/2023 and CT dated 02/16/2017. FINDINGS: Cardiovascular: There is no cardiomegaly or pericardial effusion. Mild atherosclerotic calcification of the thoracic aorta. No aneurysmal dilatation or dissection. The origins of the great vessels of the aortic arch appear patent. No pulmonary artery embolus identified. Mediastinum/Nodes: No hilar or mediastinal adenopathy. The esophagus is grossly unremarkable. No mediastinal fluid collection. Lungs/Pleura: Background of emphysema. There are bibasilar subpleural consolidation which may represent atelectasis. Pneumonia is not excluded. There is no pleural effusion or pneumothorax. The central airways are patent. Upper Abdomen: Cholecystectomy. Musculoskeletal: No acute osseous pathology. Review of the MIP images confirms the above findings. IMPRESSION: 1. No CT evidence of pulmonary embolism. 2. Bibasilar subpleural atelectasis.  Pneumonia is not excluded. 3. Aortic Atherosclerosis (ICD10-I70.0) and Emphysema (ICD10-J43.9). Electronically Signed   By: Elgie Collard M.D.   On: 05/14/2023 02:19   DG Chest 2 View  Result Date: 05/13/2023 CLINICAL DATA:  Shortness of breath, chest pain, cough. EXAM: CHEST - 2 VIEW COMPARISON:  Chest radiograph dated 04/30/2023. FINDINGS: There is blunting of the left costophrenic angle, likely a small pleural effusion. There are bibasilar atelectasis/scarring. Pneumonia is not excluded. There is no pneumothorax. Stable cardiac silhouette. Atherosclerotic calcification of the aorta. No acute osseous pathology. IMPRESSION: Small left pleural effusion and bibasilar atelectasis or infiltrate, new since the prior radiograph. Electronically Signed   By:  Elgie Collard M.D.   On: 05/13/2023 19:16    Procedures Procedures    Medications Ordered in ED Medications  ipratropium-albuterol (DUONEB) 0.5-2.5 (3) MG/3ML nebulizer solution 3 mL (3 mLs Nebulization Given 05/14/23 0046)  HYDROcodone-acetaminophen (NORCO/VICODIN) 5-325 MG per tablet 1 tablet (1 tablet Oral Given 05/14/23 0045)    ED Course/ Medical Decision Making/ A&P  Medical Decision Making Amount and/or Complexity of Data Reviewed Labs: ordered. Decision-making details documented in ED Course. Radiology: ordered and independent interpretation performed. Decision-making details documented in ED Course. ECG/medicine tests: ordered and independent interpretation performed. Decision-making details documented in ED Course.  Risk Prescription drug management. Decision regarding hospitalization.   Difficulty breathing for several weeks with history of asthma and COPD.  No hypoxia on room air.  Lungs with diminished breath sounds and wheezing.  EKG is sinus rhythm.  Chest x-ray negative for infiltrate or rib fracture. Does show possible left basilar pneumonia  Likely rib contusion.  No fracture or pneumothorax.  Labs are reassuring.  Troponin negative x 2  CT PE study is negative for pulmonary embolism.  Does show emphysema and bibasilar atelectasis.  Patient still coughing and wheezing.  She is able to ambulate and maintain O2 saturations but becomes quite short of breath and dyspneic.  Will give additional nebulizers.  She did receive magnesium and steroids.  Will treat for possible pneumonia with antibiotics blood cultures are pending.  Given her ongoing difficulty breathing with pneumonia and will admit for COPD exacerbation.  Discussed with Dr. Joneen Roach.        Final Clinical Impression(s) / ED Diagnoses Final diagnoses:  COPD exacerbation (HCC)  Community acquired pneumonia of left lower lobe of lung    Rx / DC Orders ED  Discharge Orders     None         Marcia Lepera, Jeannett Senior, MD 05/14/23 [phone]

## 2023-05-14 NOTE — H&P (Signed)
History and Physical  Lori [name] UJW:[id number] DOB: [birth year] DOA: 05/13/2023  PCP: Corwin Levins, MD   Chief Complaint: Cough, shortness of breath  HPI: Lori [name] is a 67 y.o. female with medical history significant for COPD on room air, asthma, hiatal hernia, hyperlipidemia being admitted to the hospital with community-acquired pneumonia and suspected acute exacerbation of COPD.  Patient states she has been having some shortness of breath with exertion as well as cough productive of white sputum for about the last 6 weeks.  Feels like her inhalers that she uses for asthma have not been working.  Says that in the last couple days, the sensations got worse, says that she could tell she has a pneumonia because she felt this way in the past when she had pneumonia.  Has not been on any recent antibiotics or steroids, couple of days ago she heard something under her right breast, and since then she has been having pain with coughing or taking deep breaths.  ED Course: Vital signs in the ER have been unremarkable, has been saturating well on room air even with ambulation, however reported to get dyspneic with exertion.  Lab work also pretty unremarkable, shows some borderline anemia.  Imaging as noted below has ruled out PE, shows evidence of possible community-acquired pneumonia.  She was given breathing treatments, IV steroids, empiric IV antibiotics and hospitalist contacted for admission.  Review of Systems: Please see HPI for pertinent positives and negatives. A complete 10 system review of systems are otherwise negative.  Past Medical History:  Diagnosis Date   Allergic rhinitis    Anemia    hgb 13.9 on 11/22/21   Ankle fracture 05/2016   Anxiety    Arthritis    Asthma    Barrett esophagus 2007   Chest pain    Hx, no current problems as of 11/22/21.  Recurrent episodes.  Has had 3 negative nuclear stress test, and a completely normal coronary CT angiogram   COPD (chronic  obstructive pulmonary disease) with chronic bronchitis    & Emphysema   Depression    Duodenitis without mention of hemorrhage 2007   Esophageal reflux 2007   Esophageal stricture    Full dentures    patient does not wear dentures as of 11/22/21   H/O hiatal hernia    Hiatal hernia 2006,2007   HSV infection    Hyperlipidemia    rx crestor but pt not taking   Multiple fractures    from falls, fx rt. elbow, fx left wrist, bilateral ankles   Pneumonia 10/2016   Restrictive lung disease 06/30/2017   Ulcer    no current problems as of 11/22/21   Past Surgical History:  Procedure Laterality Date   ABDOMINAL HYSTERECTOMY     BSO   APPENDECTOMY     BIOPSY  04/04/2022   Procedure: BIOPSY;  Surgeon: Lemar Lofty., MD;  Location: [facility] ENDOSCOPY;  Service: Gastroenterology;;   CESAREAN SECTION     x 2   CHOLECYSTECTOMY     CHONDROPLASTY Left 01/06/2015   Procedure: CHONDROPLASTY;  Surgeon: Eldred Manges, MD;  Location: [facility];  Service: Orthopedics;  Laterality: Left;   COLONOSCOPY     CT CTA CORONARY W/CA SCORE W/CM &/OR WO/CM  12/02/2016   CORONARY CALCIUM SCORE 0.  NO EVIDENCE OF CAD.  Normal-sized PA.  Normal pulmonary venous drainage the left atrium.  No ASD or VSD.   ELBOW FRACTURE SURGERY     right  ESOPHAGOGASTRODUODENOSCOPY (EGD) WITH PROPOFOL N/A 04/04/2022   Procedure: ESOPHAGOGASTRODUODENOSCOPY (EGD) WITH PROPOFOL;  Surgeon: Meridee Score Netty Starring., MD;  Location: [facility] ENDOSCOPY;  Service: Gastroenterology;  Laterality: N/A;   EUS N/A 04/04/2022   Procedure: UPPER ENDOSCOPIC ULTRASOUND (EUS) RADIAL;  Surgeon: Lemar Lofty., MD;  Location: [facility] ENDOSCOPY;  Service: Gastroenterology;  Laterality: N/A;   EYE SURGERY Bilateral    cataracts removed   INSERTION OF MESH N/A 11/12/2022   Procedure: INSERTION OF MESH;  Surgeon: Axel Filler, MD;  Location: [facility] OR;  Service: General;  Laterality: N/A;   KNEE ARTHROSCOPY WITH EXCISION PLICA Left  01/06/2015   Procedure: KNEE ARTHROSCOPY WITH EXCISION PLICA;  Surgeon: Eldred Manges, MD;  Location: [facility];  Service: Orthopedics;  Laterality: Left;   LIGAMENT REPAIR Right 11/28/2021   Procedure: RIGHT THUMB LIGAMENT RECONSTRUCTION AND TENDON INTERPOSITION;  Surgeon: Marlyne Beards, MD;  Location: [facility] OR;  Service: Orthopedics;  Laterality: Right;   Lower Extremity Arterial Dopplers  07/06/2013   RABI 1.0, LABI 1.1.; No evidence of significant vascular atherogenic plaque   NECK SURGERY     NM MYOVIEW LTD  09/2014   a) LOW RISK. Normal EF of 65%, no RWMA. NO ISCHEMIA OR INFARCTION.;; b) 05/02/2009: Normal study.  LOW RISK.  No ischemia or infarction.  EF 74%.  Breast attenuation noted.   TONSILLECTOMY     TRANSTHORACIC ECHOCARDIOGRAM  03/28/2021   a) Normal EF 60 to 65%.  GR 1 DD.  No R WMA.  Normal RV size and function.  Normal RAP.  Normal valves. => NORMAL;; b) 04/23/16/2010: Normal study.  LOW RISK.  No ischemia or infarction.  EF 74%.  Breast attenuation noted.17/2010: ([facility]) EF>55%.  Normal wall motion.  Normal valves.   WRIST FRACTURE SURGERY     left, has plate   XI ROBOTIC ASSISTED PARAESOPHAGEAL HERNIA REPAIR N/A 11/12/2022   Procedure: ROBOTIC DIAPHRAGMATIC HERNIA REPAIR WITH MESH;  Surgeon: Axel Filler, MD;  Location: [facility] OR;  Service: General;  Laterality: N/A;    Social History:  reports that she has been smoking cigarettes. She started smoking about 45 years ago. She has a 22.9 pack-year smoking history. She has never used smokeless tobacco. She reports that she does not drink alcohol and does not use drugs.   Allergies  Allergen Reactions   Advil [Ibuprofen] Nausea And Vomiting   Bee Venom Swelling   Codeine Nausea Only and Other (See Comments)    CAUSES ULCERS   Clonidine Derivatives Other (See Comments)    Unknown    Doxycycline Other (See Comments)    nausea   Statins Other (See Comments)    unknown   Latex Rash   Levaquin [Levofloxacin]  Nausea Only   Propoxyphene N-Acetaminophen Nausea Only    Family History  Problem Relation Age of Onset   Emphysema Father    Dementia Mother    Diabetes Maternal Grandmother    Diabetes Maternal Aunt    Diabetes Other        mat. cousin   Heart disease Brother    Colon cancer Neg Hx    Allergic rhinitis Neg Hx    Angioedema Neg Hx    Asthma Neg Hx    Atopy Neg Hx    Eczema Neg Hx    Immunodeficiency Neg Hx    Urticaria Neg Hx      Prior to Admission medications   Medication Sig Start Date End Date Taking? Authorizing Provider  albuterol (PROAIR HFA) 108 (90  Base) MCG/ACT inhaler Inhale 2 puffs into the lungs every 6 (six) hours as needed for wheezing or shortness of breath. 12/27/22  Yes Corwin Levins, MD  ALPRAZolam Prudy Feeler) 1 MG tablet TAKE 1 TABLET BY MOUTH AT BEDTIME AS NEEDED FOR ANXIETY 04/09/23  Yes Corwin Levins, MD  Benralizumab [facility]) 30 MG/ML SOSY INJECT 30MG  SUBCUTANEOUSLY  EVERY 8 WEEKS 09/12/22  Yes Alfonse Spruce, MD  citalopram (CELEXA) 20 MG tablet Take 1 tablet (20 mg total) by mouth daily. 04/09/23  Yes Corwin Levins, MD  cyclobenzaprine (FLEXERIL) 5 MG tablet Take 1 tablet (5 mg total) by mouth 3 (three) times daily as needed for muscle spasms. 02/11/23  Yes Corwin Levins, MD  dexlansoprazole (DEXILANT) 60 MG capsule Take 1 capsule (60 mg total) by mouth daily. 06/25/22  Yes Corwin Levins, MD  EPINEPHrine 0.3 mg/0.3 mL IJ SOAJ injection INJECT AS DIRECTED FOR SEVERE ALLERGIC REACTION Patient taking differently: Inject 0.3 mg into the muscle as needed for anaphylaxis (See Instructions). INJECT AS DIRECTED FOR SEVERE ALLERGIC REACTION 09/25/22  Yes Alfonse Spruce, MD  Erenumab-aooe 140 MG/ML SOAJ Inject 140 mg into the skin every 30 (thirty) days.   Yes [provider]  gabapentin (NEURONTIN) 600 MG tablet Take 600 mg by mouth 2 (two) times daily. 04/30/22  Yes [provider]  meclizine (ANTIVERT) 12.5 MG tablet TAKE 1 TABLET BY MOUTH  THREE TIMES DAILY AS NEEDED FOR DIZZINESS 03/17/23  Yes Corwin Levins, MD  nitroGLYCERIN (NITROSTAT) 0.4 MG SL tablet PLACE 1 TABLET UNDER THE TONGUE EVERY 5 MINUTES AS NEEDED FOR CHEST PAIN 07/16/22  Yes [name], MD  traMADol (ULTRAM) 50 MG tablet Take 1 tablet (50 mg total) by mouth every 6 (six) hours as needed. 04/30/23  Yes Corwin Levins, MD  valACYclovir (VALTREX) 1000 MG tablet Take 1 tablet by mouth twice daily 04/25/23  Yes Corwin Levins, MD  montelukast (SINGULAIR) 10 MG tablet Take 1 tablet (10 mg total) by mouth at bedtime. Patient taking differently: Take 10 mg by mouth in the morning. 06/25/22   Corwin Levins, MD  pantoprazole (PROTONIX) 40 MG tablet Take 1 tablet (40 mg total) by mouth daily. Patient not taking: Reported on 05/14/2023 01/01/23   Corwin Levins, MD  predniSONE (DELTASONE) 10 MG tablet 3 tabs by mouth per day for 3 days,2tabs per day for 3 days,1tab per day for 3 days Patient not taking: Reported on 05/14/2023 04/09/23   Corwin Levins, MD  sulfamethoxazole-trimethoprim (BACTRIM DS) 800-160 MG tablet Take 1 tablet by mouth 2 (two) times daily. Patient not taking: Reported on 05/14/2023 04/30/23   Corwin Levins, MD    Physical Exam: BP 118/69   Pulse 78   Temp 97.7 F (36.5 C) (Oral)   Resp 17   Ht 5\' 4"  (1.626 m)   Wt 53.5 kg   SpO2 98%   BMI 20.25 kg/m   General:  Alert, oriented, calm, in no acute distress, speaking in full sentences on room air without cough Eyes: EOMI, clear conjuctivae, white sclerea Neck: supple, no masses, trachea mildline  Cardiovascular: RRR, no murmurs or rubs, no peripheral edema  Respiratory: clear to auscultation bilaterally, no wheezes, no crackles, no tachypnea or other evidence of respiratory distress Abdomen: soft, nontender, nondistended, normal bowel tones heard  Skin: dry, no rashes  Musculoskeletal: no joint effusions, normal range of motion  Psychiatric: appropriate affect, normal speech  Neurologic: extraocular  muscles intact,  clear speech, moving all extremities with intact sensorium          Labs on Admission:  Basic Metabolic Panel: Recent Labs  Lab 05/13/23 1938 05/14/23 0053  NA 137  --   K 3.5  --   CL 110  --   CO2 18*  --   GLUCOSE 146*  --   BUN 10  --   CREATININE 0.87  --   CALCIUM 8.5*  --   MG  --  1.6*   Liver Function Tests: Recent Labs  Lab 05/14/23 0053  AST 20  ALT 15  ALKPHOS 70  BILITOT 0.5  PROT 7.0  ALBUMIN 4.2   Recent Labs  Lab 05/14/23 0053  LIPASE 32   No results for input(s): "AMMONIA" in the last 168 hours. CBC: Recent Labs  Lab 05/13/23 1938  WBC 6.6  HGB 11.6*  HCT 33.4*  MCV 98.2  PLT 186   Cardiac Enzymes: No results for input(s): "CKTOTAL", "CKMB", "CKMBINDEX", "TROPONINI" in the last 168 hours.  BNP (last 3 results) No results for input(s): "BNP" in the last 8760 hours.  ProBNP (last 3 results) No results for input(s): "PROBNP" in the last 8760 hours.  CBG: No results for input(s): "GLUCAP" in the last 168 hours.  Radiological Exams on Admission: CT Angio Chest PE W and/or Wo Contrast  Result Date: 05/14/2023 CLINICAL DATA:  Chest pain and shortness of breath. Concern for pulmonary embolism. EXAM: CT ANGIOGRAPHY CHEST WITH CONTRAST TECHNIQUE: Multidetector CT imaging of the chest was performed using the standard protocol during bolus administration of intravenous contrast. Multiplanar CT image reconstructions and MIPs were obtained to evaluate the vascular anatomy. RADIATION DOSE REDUCTION: This exam was performed according to the departmental dose-optimization program which includes automated exposure control, adjustment of the mA and/or kV according to patient size and/or use of iterative reconstruction technique. CONTRAST:  75mL OMNIPAQUE IOHEXOL 350 MG/ML SOLN COMPARISON:  Chest radiograph dated 05/13/2023 and CT dated 02/16/2017. FINDINGS: Cardiovascular: There is no cardiomegaly or pericardial effusion. Mild atherosclerotic  calcification of the thoracic aorta. No aneurysmal dilatation or dissection. The origins of the great vessels of the aortic arch appear patent. No pulmonary artery embolus identified. Mediastinum/Nodes: No hilar or mediastinal adenopathy. The esophagus is grossly unremarkable. No mediastinal fluid collection. Lungs/Pleura: Background of emphysema. There are bibasilar subpleural consolidation which may represent atelectasis. Pneumonia is not excluded. There is no pleural effusion or pneumothorax. The central airways are patent. Upper Abdomen: Cholecystectomy. Musculoskeletal: No acute osseous pathology. Review of the MIP images confirms the above findings. IMPRESSION: 1. No CT evidence of pulmonary embolism. 2. Bibasilar subpleural atelectasis.  Pneumonia is not excluded. 3. Aortic Atherosclerosis (ICD10-I70.0) and Emphysema (ICD10-J43.9). Electronically Signed   By: Elgie Collard M.D.   On: 05/14/2023 02:19   DG Chest 2 View  Result Date: 05/13/2023 CLINICAL DATA:  Shortness of breath, chest pain, cough. EXAM: CHEST - 2 VIEW COMPARISON:  Chest radiograph dated 04/30/2023. FINDINGS: There is blunting of the left costophrenic angle, likely a small pleural effusion. There are bibasilar atelectasis/scarring. Pneumonia is not excluded. There is no pneumothorax. Stable cardiac silhouette. Atherosclerotic calcification of the aorta. No acute osseous pathology. IMPRESSION: Small left pleural effusion and bibasilar atelectasis or infiltrate, new since the prior radiograph. Electronically Signed   By: Elgie Collard M.D.   On: 05/13/2023 19:16    Assessment/Plan Lori [name] is a 67 y.o. female with medical history significant for COPD on room air, asthma, hiatal hernia, hyperlipidemia being  admitted to the hospital with community-acquired pneumonia and suspected acute exacerbation of COPD.  Acute exacerbation of COPD-with increased cough, dyspnea on exertion, without significant hypoxia -Inpatient  admission -Supplemental oxygen as needed, goal O2 saturation greater than 90% -Scheduled DuoNebs, as needed albuterol -IV Solu-Medrol  Community-acquired pneumonia-suspected due to increased productive cough, possible infiltrate on chest x-ray -Empiric IV azithromycin and IV Rocephin -Treat COPD as above  Anemia-this is borderline and inconsequential, recheck CBC in the morning  GERD-Protonix  Hypomagnesemia-will give 1 g magnesium  DVT prophylaxis: Lovenox   Full code  Consults called: None  Admission status: Observation  Time spent: 59 minutes  Deforest Maiden Sharlette Dense MD [facility] Pager [phone]  If 7PM-7AM, please contact night-coverage [email or web address] Password [email or web address]  05/14/2023, 8:12 AM

## 2023-05-14 NOTE — ED Notes (Signed)
ED TO INPATIENT HANDOFF REPORT  Name/Age/Gender Lori [name] 67 y.o. female  Code Status    Code Status Orders  (From admission, onward)           Start     Ordered   05/14/23 0829  Full code  Continuous       Question:  By:  Answer:  Consent: discussion documented in EHR   05/14/23 0829           Code Status History     Date Active Date Inactive Code Status Order ID Comments User Context   03/28/2021 1054 04/02/2021 1540 Full Code [id number]  Cathren Harsh, MD ED   10/26/2016 1637 10/29/2016 1400 Full Code [id number]  Lenox Ponds, MD ED       Home/SNF/Other Home  Chief Complaint CAP (community acquired pneumonia) [J18.9]  Level of Care/Admitting Diagnosis ED Disposition     ED Disposition  Admit   Condition  --   Comment  Hospital Area: [facility] [[id number]]  Level of Care: Med-Surg [16]  May place patient in observation at [facility] or [facility] if equivalent level of care is available:: Yes  Covid Evaluation: Symptomatic Person Under Investigation (PUI) or recent exposure (last 10 days) *Testing Required*  Diagnosis: CAP (community acquired pneumonia) [782956]  Admitting Physician: Maryln Gottron [[id number]]  Attending Physician: Kirby Crigler, MIR [email or web address] [[id number]]          Medical History Past Medical History:  Diagnosis Date   Allergic rhinitis    Anemia    hgb 13.9 on 11/22/21   Ankle fracture 05/2016   Anxiety    Arthritis    Asthma    Barrett esophagus 2007   Chest pain    Hx, no current problems as of 11/22/21.  Recurrent episodes.  Has had 3 negative nuclear stress test, and a completely normal coronary CT angiogram   COPD (chronic obstructive pulmonary disease) with chronic bronchitis    & Emphysema   Depression    Duodenitis without mention of hemorrhage 2007   Esophageal reflux 2007   Esophageal stricture    Full dentures    patient does not wear dentures as of 11/22/21   H/O hiatal hernia    Hiatal  hernia 2006,2007   HSV infection    Hyperlipidemia    rx crestor but pt not taking   Multiple fractures    from falls, fx rt. elbow, fx left wrist, bilateral ankles   Pneumonia 10/2016   Restrictive lung disease 06/30/2017   Ulcer    no current problems as of 11/22/21    Allergies Allergies  Allergen Reactions   Advil [Ibuprofen] Nausea And Vomiting   Bee Venom Swelling   Codeine Nausea Only and Other (See Comments)    CAUSES ULCERS   Clonidine Derivatives Other (See Comments)    Unknown    Doxycycline Other (See Comments)    nausea   Statins Other (See Comments)    unknown   Latex Rash   Levaquin [Levofloxacin] Nausea Only   Propoxyphene N-Acetaminophen Nausea Only    IV Location/Drains/Wounds Patient Lines/Drains/Airways Status     Active Line/Drains/Airways     Name Placement date Placement time Site Days   Peripheral IV 05/14/23 18 G Left Antecubital 05/14/23  0051  Antecubital  less than 1   Peripheral IV 05/13/23 22 G Right;Posterior Hand 05/13/23  1900  Hand  1   Incision - 3 Ports Abdomen 1: Right 2: Mid 3: Left  11/12/22  0755  -- 183            Labs/Imaging Results for orders placed or performed during the hospital encounter of 05/13/23 (from the past 48 hour(s))  CBC     Status: Abnormal   Collection Time: 05/13/23  7:38 PM  Result Value Ref Range   WBC 6.6 4.0 - 10.5 K/uL   RBC 3.40 (L) 3.87 - 5.11 MIL/uL   Hemoglobin 11.6 (L) 12.0 - 15.0 g/dL   HCT 28.4 (L) 13.2 - 44.0 %   MCV 98.2 80.0 - 100.0 fL   MCH 34.1 (H) 26.0 - 34.0 pg   MCHC 34.7 30.0 - 36.0 g/dL   RDW 10.2 72.5 - 36.6 %   Platelets 186 150 - 400 K/uL   nRBC 0.0 0.0 - 0.2 %    Comment: Performed at [facility], [address] [place], [place] [address]  Basic metabolic panel     Status: Abnormal   Collection Time: 05/13/23  7:38 PM  Result Value Ref Range   Sodium 137 135 - 145 mmol/L   Potassium 3.5 3.5 - 5.1 mmol/L   Chloride 110 98 - 111 mmol/L   CO2 18 (L)  22 - 32 mmol/L   Glucose, Bld 146 (H) 70 - 99 mg/dL    Comment: Glucose reference range applies only to samples taken after fasting for at least 8 hours.   BUN 10 8 - 23 mg/dL   Creatinine, Ser 7.42 0.44 - 1.00 mg/dL   Calcium 8.5 (L) 8.9 - 10.3 mg/dL   GFR, Estimated >59 >56 mL/min    Comment: (NOTE) Calculated using the CKD-EPI Creatinine Equation (2021)    Anion gap 9 5 - 15    Comment: Performed at [facility], [address], [place], [place] [address]  Troponin I (High Sensitivity)     Status: None   Collection Time: 05/13/23  7:38 PM  Result Value Ref Range   Troponin I (High Sensitivity) 2 <18 ng/L    Comment: (NOTE) Elevated high sensitivity troponin I (hsTnI) values and significant  changes across serial measurements may suggest ACS but many other  chronic and acute conditions are known to elevate hsTnI results.  Refer to the "Links" section for chest pain algorithms and additional  guidance. Performed at [facility], [address] [place], [place] [address]   Troponin I (High Sensitivity)     Status: None   Collection Time: 05/13/23 10:57 PM  Result Value Ref Range   Troponin I (High Sensitivity) 2 <18 ng/L    Comment: (NOTE) Elevated high sensitivity troponin I (hsTnI) values and significant  changes across serial measurements may suggest ACS but many other  chronic and acute conditions are known to elevate hsTnI results.  Refer to the "Links" section for chest pain algorithms and additional  guidance. Performed at [facility], [address] [place], [place] [address]   Magnesium     Status: Abnormal   Collection Time: 05/14/23 12:53 AM  Result Value Ref Range   Magnesium 1.6 (L) 1.7 - 2.4 mg/dL    Comment: Performed at [facility], [address] [place], [place] [address]  Hepatic function panel     Status: None   Collection Time: 05/14/23 12:53 AM  Result Value Ref Range    Total Protein 7.0 6.5 - 8.1 g/dL   Albumin 4.2 3.5 - 5.0 g/dL   AST 20 15 - 41  U/L   ALT 15 0 - 44 U/L   Alkaline Phosphatase 70 38 - 126 U/L   Total Bilirubin 0.5 0.3 - 1.2 mg/dL   Bilirubin, Direct <7.8 0.0 - 0.2 mg/dL   Indirect Bilirubin NOT CALCULATED 0.3 - 0.9 mg/dL    Comment: Performed at [facility], [address], [place], [place] [address]  Lipase, blood     Status: None   Collection Time: 05/14/23 12:53 AM  Result Value Ref Range   Lipase 32 11 - 51 U/L    Comment: Performed at [facility], [address] [place], [place] [address]  Culture, blood (Routine X 2) w Reflex to ID Panel     Status: None (Preliminary result)   Collection Time: 05/14/23  4:52 AM   Specimen: BLOOD  Result Value Ref Range   Specimen Description      BLOOD BLOOD RIGHT ARM Performed at [facility], [address] [place], [place] [address]    Special Requests      BOTTLES DRAWN AEROBIC AND ANAEROBIC Blood Culture adequate volume Performed at [facility], [address], [place], [place] [address]    Culture      NO GROWTH <12 HOURS Performed at [facility], [address], [place], [place] [address]    Report Status PENDING   Culture, blood (Routine X 2) w Reflex to ID Panel     Status: None (Preliminary result)   Collection Time: 05/14/23  4:52 AM   Specimen: BLOOD  Result Value Ref Range   Specimen Description      BLOOD SITE NOT SPECIFIED Performed at [facility], [address], [place], [place] [address]    Special Requests      BOTTLES DRAWN AEROBIC ONLY Blood Culture results may not be optimal due to an inadequate volume of blood received in culture bottles Performed at [facility], [address] [place], [place] [address]    Culture      NO GROWTH <12 HOURS Performed at [facility], [address] [place], [place] [address]    Report Status  PENDING    *Note: Due to a large number of results and/or encounters for the requested time period, some results have not been displayed. A complete set of results can be found in Results Review.   CT Angio Chest PE W and/or Wo Contrast  Result Date: 05/14/2023 CLINICAL DATA:  Chest pain and shortness of breath. Concern for pulmonary embolism. EXAM: CT ANGIOGRAPHY CHEST WITH CONTRAST TECHNIQUE: Multidetector CT imaging of the chest was performed using the standard protocol during bolus administration of intravenous contrast. Multiplanar CT image reconstructions and MIPs were obtained to evaluate the vascular anatomy. RADIATION DOSE REDUCTION: This exam was performed according to the departmental dose-optimization program which includes automated exposure control, adjustment of the mA and/or kV according to patient size and/or use of iterative reconstruction technique. CONTRAST:  75mL OMNIPAQUE IOHEXOL 350 MG/ML SOLN COMPARISON:  Chest radiograph dated 05/13/2023 and CT dated 02/16/2017. FINDINGS: Cardiovascular: There is no cardiomegaly or pericardial effusion. Mild atherosclerotic calcification of the thoracic aorta. No aneurysmal dilatation or dissection. The origins of the great vessels of the aortic arch appear patent. No pulmonary artery embolus identified. Mediastinum/Nodes: No hilar or mediastinal adenopathy. The esophagus is grossly unremarkable. No mediastinal fluid collection. Lungs/Pleura: Background of emphysema. There are bibasilar subpleural consolidation which may represent atelectasis. Pneumonia is not excluded. There is  no pleural effusion or pneumothorax. The central airways are patent. Upper Abdomen: Cholecystectomy. Musculoskeletal: No acute osseous pathology. Review of the MIP images confirms the above findings. IMPRESSION: 1. No CT evidence of pulmonary embolism. 2. Bibasilar subpleural atelectasis.  Pneumonia is not excluded. 3. Aortic Atherosclerosis (ICD10-I70.0) and Emphysema  (ICD10-J43.9). Electronically Signed   By: Elgie Collard M.D.   On: 05/14/2023 02:19   DG Chest 2 View  Result Date: 05/13/2023 CLINICAL DATA:  Shortness of breath, chest pain, cough. EXAM: CHEST - 2 VIEW COMPARISON:  Chest radiograph dated 04/30/2023. FINDINGS: There is blunting of the left costophrenic angle, likely a small pleural effusion. There are bibasilar atelectasis/scarring. Pneumonia is not excluded. There is no pneumothorax. Stable cardiac silhouette. Atherosclerotic calcification of the aorta. No acute osseous pathology. IMPRESSION: Small left pleural effusion and bibasilar atelectasis or infiltrate, new since the prior radiograph. Electronically Signed   By: Elgie Collard M.D.   On: 05/13/2023 19:16    Pending Labs Unresulted Labs (From admission, onward)     Start     Ordered   05/15/23 0500  Basic metabolic panel  Tomorrow morning,   R        05/14/23 0829   05/15/23 0500  CBC  Tomorrow morning,   R        05/14/23 0829   05/14/23 0828  HIV Antibody (routine testing w rflx)  (HIV Antibody (Routine testing w reflex) panel)  Once,   R        05/14/23 0829            Vitals/Pain Today's Vitals   05/14/23 1200 05/14/23 1219 05/14/23 1254 05/14/23 1255  BP: 113/70     Pulse: 88     Resp: 19     Temp:  98.6 F (37 C)    TempSrc:  Oral    SpO2: 93%     Weight:      Height:      PainSc:   Asleep Asleep    Isolation Precautions No active isolations  Medications Medications  nicotine (NICODERM CQ - dosed in mg/24 hours) patch 14 mg (14 mg Transdermal Patch Applied 05/14/23 1046)  guaiFENesin (ROBITUSSIN) 100 MG/5ML liquid 5 mL (5 mLs Oral Given 05/14/23 0440)  methylPREDNISolone sodium succinate (SOLU-MEDROL) 40 mg/mL injection 40 mg (40 mg Intravenous Given 05/14/23 0942)  montelukast (SINGULAIR) tablet 10 mg (10 mg Oral Given 05/14/23 0943)  pantoprazole (PROTONIX) EC tablet 40 mg (40 mg Oral Given 05/14/23 0940)  ondansetron (ZOFRAN) injection 4 mg (has no  administration in time range)  ketorolac (TORADOL) 30 MG/ML injection 30 mg (has no administration in time range)  fentaNYL (SUBLIMAZE) injection 50 mcg (50 mcg Intravenous Not Given 05/14/23 0413)  azithromycin (ZITHROMAX) 500 mg in sodium chloride 0.9 % 250 mL IVPB (has no administration in time range)  cefTRIAXone (ROCEPHIN) 1 g in sodium chloride 0.9 % 100 mL IVPB (has no administration in time range)  traMADol (ULTRAM) tablet 50 mg (50 mg Oral Given 05/14/23 1135)  valACYclovir (VALTREX) tablet 1,000 mg (1,000 mg Oral Given 05/14/23 0940)  ALPRAZolam (XANAX) tablet 1 mg (has no administration in time range)  citalopram (CELEXA) tablet 20 mg (20 mg Oral Given 05/14/23 1046)  cyclobenzaprine (FLEXERIL) tablet 5 mg (has no administration in time range)  gabapentin (NEURONTIN) capsule 600 mg (600 mg Oral Given 05/14/23 0940)  enoxaparin (LOVENOX) injection 40 mg (40 mg Subcutaneous Given 05/14/23 0945)  senna-docusate (Senokot-S) tablet 1 tablet (has no administration in time range)  albuterol (PROVENTIL) (2.5 MG/3ML) 0.083% nebulizer solution 2.5 mg (has no administration in time range)  ipratropium-albuterol (DUONEB) 0.5-2.5 (3) MG/3ML nebulizer solution 3 mL (has no administration in time range)  ipratropium-albuterol (DUONEB) 0.5-2.5 (3) MG/3ML nebulizer solution 3 mL (3 mLs Nebulization Given 05/14/23 0046)  HYDROcodone-acetaminophen (NORCO/VICODIN) 5-325 MG per tablet 1 tablet (1 tablet Oral Given 05/14/23 0045)  iohexol (OMNIPAQUE) 350 MG/ML injection 75 mL (75 mLs Intravenous Contrast Given 05/14/23 0154)  cefTRIAXone (ROCEPHIN) 1 g in sodium chloride 0.9 % 100 mL IVPB (0 g Intravenous Stopped 05/14/23 0622)  azithromycin (ZITHROMAX) 500 mg in sodium chloride 0.9 % 250 mL IVPB (0 mg Intravenous Stopped 05/14/23 0443)  magnesium sulfate IVPB 2 g 50 mL (0 g Intravenous Stopped 05/14/23 0412)  HYDROmorphone (DILAUDID) injection 0.5 mg (0.5 mg Intravenous Given 05/14/23 0440)  ondansetron (ZOFRAN)  injection 4 mg (4 mg Intravenous Given 05/14/23 0440)  magnesium sulfate IVPB 2 g 50 mL (0 g Intravenous Stopped 05/14/23 1122)    Mobility walks

## 2023-05-14 NOTE — ED Notes (Signed)
Ambulated pt with SpO2, pt felt fine, but a little difficulty breathing. SpO2 level stayed between 98-99%.

## 2023-05-15 DIAGNOSIS — J441 Chronic obstructive pulmonary disease with (acute) exacerbation: Secondary | ICD-10-CM

## 2023-05-15 DIAGNOSIS — J189 Pneumonia, unspecified organism: Secondary | ICD-10-CM | POA: Diagnosis not present

## 2023-05-15 LAB — BASIC METABOLIC PANEL
Anion gap: 10 (ref 5–15)
BUN: 14 mg/dL (ref 8–23)
CO2: 21 mmol/L — ABNORMAL LOW (ref 22–32)
Calcium: 8.7 mg/dL — ABNORMAL LOW (ref 8.9–10.3)
Chloride: 105 mmol/L (ref 98–111)
Creatinine, Ser: 0.82 mg/dL (ref 0.44–1.00)
GFR, Estimated: >60 mL/min (ref >60)
Glucose, Bld: 126 mg/dL — ABNORMAL HIGH (ref 70–99)
Potassium: 4.8 mmol/L (ref 3.5–5.1)
Sodium: 136 mmol/L (ref 135–145)

## 2023-05-15 LAB — CBC
HCT: 39.9 % (ref 36.0–46.0)
Hemoglobin: 13.2 g/dL (ref 12.0–15.0)
MCH: 32.8 pg (ref 26.0–34.0)
MCHC: 33.1 g/dL (ref 30.0–36.0)
MCV: 99 fL (ref 80.0–100.0)
Platelets: 265 10*3/uL (ref 150–400)
RBC: 4.03 MIL/uL (ref 3.87–5.11)
RDW: 13.5 % (ref 11.5–15.5)
WBC: 16.7 10*3/uL — ABNORMAL HIGH (ref 4.0–10.5)
nRBC: 0 % (ref 0.0–0.2)

## 2023-05-15 LAB — HIV ANTIBODY (ROUTINE TESTING W REFLEX): HIV Screen 4th Generation wRfx: NONREACTIVE

## 2023-05-15 MED ORDER — OXYCODONE HCL 5 MG PO TABS
5.0000 mg | ORAL_TABLET | ORAL | Status: DC | PRN
  Administered 2023-05-15 – 2023-05-18 (×6): 5 mg via ORAL
  Filled 2023-05-15 (×6): qty 1

## 2023-05-15 MED ORDER — ALUM & MAG HYDROXIDE-SIMETH 200-200-20 MG/5ML PO SUSP
30.0000 mL | Freq: Four times a day (QID) | ORAL | Status: DC | PRN
  Administered 2023-05-17 – 2023-05-18 (×3): 30 mL via ORAL
  Filled 2023-05-15 (×3): qty 30

## 2023-05-15 MED ORDER — LIDOCAINE 5 % EX PTCH
1.0000 | MEDICATED_PATCH | CUTANEOUS | Status: DC
  Administered 2023-05-15 – 2023-05-19 (×5): 1 via TRANSDERMAL
  Filled 2023-05-15 (×5): qty 1

## 2023-05-15 MED ORDER — CALCIUM CARBONATE ANTACID 500 MG PO CHEW
1.0000 | CHEWABLE_TABLET | Freq: Three times a day (TID) | ORAL | Status: DC | PRN
  Administered 2023-05-15 – 2023-05-19 (×6): 200 mg via ORAL
  Filled 2023-05-15 (×7): qty 1

## 2023-05-15 MED ORDER — IPRATROPIUM-ALBUTEROL 0.5-2.5 (3) MG/3ML IN SOLN: 3.0000 mL | Freq: Four times a day (QID) | RESPIRATORY_TRACT | Status: DC | PRN

## 2023-05-15 MED ORDER — METHOCARBAMOL 500 MG PO TABS
500.0000 mg | ORAL_TABLET | Freq: Three times a day (TID) | ORAL | Status: DC
  Administered 2023-05-15 – 2023-05-19 (×12): 500 mg via ORAL
  Filled 2023-05-15 (×12): qty 1

## 2023-05-15 MED ORDER — ACETAMINOPHEN 500 MG PO TABS
500.0000 mg | ORAL_TABLET | Freq: Four times a day (QID) | ORAL | Status: DC
  Administered 2023-05-15 – 2023-05-19 (×16): 500 mg via ORAL
  Filled 2023-05-15 (×16): qty 1

## 2023-05-15 NOTE — Assessment & Plan Note (Signed)
-   Problem noted already on her problem list but she is complaining of acutely worsened right sided chest wall pain from excessive coughing recently -Trial of lidocaine patch and Robaxin - Short trial of oxycodone for breakthrough pain and start on scheduled Tylenol

## 2023-05-15 NOTE — Progress Notes (Signed)
Mobility Specialist - Progress Note   05/15/23 1052  Mobility  Activity Ambulated with assistance in hallway  Level of Assistance Contact guard assist, steadying assist  Assistive Device None (hand held assist)  Distance Ambulated (ft) 500 ft  Range of Motion/Exercises Active  Activity Response Tolerated well  Mobility Referral Yes  $Mobility charge 1 Mobility  Mobility Specialist Start Time (ACUTE ONLY) 1032  Mobility Specialist Stop Time (ACUTE ONLY) 1043  Mobility Specialist Time Calculation (min) (ACUTE ONLY) 11 min   Pt received in bed and agreed to mobility. Had no issues throughout session, and returned to bed with all needs met.  Marilynne Halsted Mobility Specialist

## 2023-05-15 NOTE — Assessment & Plan Note (Addendum)
-   CT angio chest shows infiltrates notably more prominent in the left lower lobe compared to right lower lobe -WBC elevated on admission as well -Completed Rocephin and azithromycin during hospitalization

## 2023-05-15 NOTE — Assessment & Plan Note (Signed)
-   Continue Celexa and Xanax (verified on database)

## 2023-05-15 NOTE — Assessment & Plan Note (Addendum)
Repleted. °

## 2023-05-15 NOTE — Plan of Care (Signed)

## 2023-05-15 NOTE — Hospital Course (Addendum)
Lori [name] is a 67 yo female with PMH COPD, ongoing tobacco use, hiatal hernia, HLD who presented with worsening dyspnea and cough.  She has productive white sputum at baseline but other symptoms have been going on for approximately 6 weeks she stated.  Home breathing treatments were not working.  She had not been on any recent antibiotics or steroids prior to admission.  She ultimately presented due to no improvement in her symptoms. CT angio chest was performed which was negative for PE.  Bibasilar atelectasis with possible superimposed infiltrate was appreciated.  She was started on Rocephin and azithromycin and admitted for further treatment of pneumonia and COPD exacerbation.  She had good clinical response to antibiotics and steroids.  Antibiotic course completed during hospitalization and she was continued on further prednisone course.

## 2023-05-15 NOTE — TOC Initial Note (Signed)
Transition of Care [facility]) - Initial/Assessment Note    Patient Details  Name: Lori [name] MRN: [id number] Date of Birth: Aug 19, 1956  [facility]) CM/SW Contact:    Adrian Prows, RN Phone Number: 05/15/2023, 9:33 AM  Clinical Narrative:                 Pt says she is from home and plans to return at d/c; she identified POC Alverda Skeans (friend) [phone]; pt says she has dentures (upper and lower); pt says she does not have DME, HH services, or home oxygen; no TOC needs identified; please place consult if needed; TOC signing off.  Expected Discharge Plan: Home/Self Care Barriers to Discharge: Continued Medical Work up   Patient Goals and CMS Choice Patient states their goals for this hospitalization and ongoing recovery are:: home          Expected Discharge Plan and Services   Discharge Planning Services: CM Consult   Living arrangements for the past 2 months: Mobile Home                                      Prior Living Arrangements/Services Living arrangements for the past 2 months: Mobile Home Lives with:: Self Patient language and need for interpreter reviewed:: Yes Do you feel safe going back to the place where you live?: Yes      Need for Family Participation in Patient Care: Yes (Comment) Care giver support system in place?: Yes (comment) Current home services:  (n/a) Criminal Activity/Legal Involvement Pertinent to Current Situation/Hospitalization: No - Comment as needed  Activities of Daily Living Home Assistive Devices/Equipment: None ADL Screening (condition at time of admission) Patient's cognitive ability adequate to safely complete daily activities?: Yes Is the patient deaf or have difficulty hearing?: No Does the patient have difficulty seeing, even when wearing glasses/contacts?: No Does the patient have difficulty concentrating, remembering, or making decisions?: No Patient able to express need for assistance  with ADLs?: Yes Does the patient have difficulty dressing or bathing?: No Independently performs ADLs?: Yes (appropriate for developmental age) Does the patient have difficulty walking or climbing stairs?: No Weakness of Legs: None Weakness of Arms/Hands: None  Permission Sought/Granted Permission sought to share information with : Case Manager Permission granted to share information with : Yes, Verbal Permission Granted  Share Information with NAME: Case Manager     Permission granted to share info w Relationship: Alverda Skeans (friend) [phone]     Emotional Assessment Appearance:: Appears stated age Attitude/Demeanor/Rapport: Gracious Affect (typically observed): Accepting Orientation: : Oriented to Self, Oriented to Place, Oriented to  Time, Oriented to Situation Alcohol / Substance Use: Not Applicable Psych Involvement: No (comment)  Admission diagnosis:  COPD exacerbation (HCC) [J44.1] CAP (community acquired pneumonia) [J18.9] Community acquired pneumonia of left lower lobe of lung [J18.9] Patient Active Problem List   Diagnosis Date Noted   CAP (community acquired pneumonia) 05/14/2023   Upper abdominal pain 09/19/2022   Constipation 09/19/2022   Lower abdominal pain 06/25/2022   COVID-19 virus infection 06/09/2022   Pain in left ankle and joints of left foot 05/15/2022   Weight loss 01/10/2022   Arthritis of carpometacarpal (CMC) joint of right thumb 10/25/2021   Right-sided chest pain 09/05/2021   Neck pain 04/11/2021   Thoracic spine pain 04/11/2021   Low back pain 04/11/2021   Bilateral hip pain 04/11/2021   AKI (acute kidney  injury) (HCC) 04/11/2021   Allergic rhinitis 01/08/2021   Abnormal urine odor 11/14/2020   Aortic atherosclerosis (HCC) 11/14/2020   Severe persistent asthma without complication 11/02/2020   Hymenoptera allergy 11/02/2020   Genital herpes 11/12/2019   Coccyx pain 11/12/2019   Insomnia 11/12/2019   Acute right-sided low back pain  without sciatica 07/20/2019   Grief 07/20/2019   Vitamin D deficiency 07/20/2019   Hyperglycemia 04/21/2019   Left knee pain 04/21/2019   Pain 12/29/2018   B12 deficiency 10/20/2018   Bilateral hand pain 08/03/2018   Nausea 04/27/2018   Anemia, iron deficiency 04/27/2018   Intermittent paresthesia of hand and foot 04/27/2018   Headache 03/21/2018   Lumbar radiculitis 08/15/2017   Degenerative disc disease, lumbar 07/21/2017   Encounter for well adult exam with abnormal findings 06/30/2017   Epigastric pain 06/30/2017   Restrictive lung disease 06/30/2017   Leg cramping 06/09/2017   Elbow contusion 06/09/2017   Right elbow pain 05/27/2017   Asthma-COPD overlap syndrome 01/31/2017   Seasonal and perennial allergic rhinitis 01/01/2017   Moderate persistent asthma, uncomplicated 01/01/2017   Pulmonary emphysema (HCC) 12/03/2016   Tobacco abuse 12/03/2016   Non-seasonal allergic rhinitis due to fungal spores 12/03/2016   Medication management 11/29/2016   Rash 11/23/2016   Community acquired pneumonia of left lower lobe of lung 10/26/2016   Vertigo 10/22/2016   Gastroesophageal reflux disease with esophagitis 10/22/2016   Non compliance w medication regimen 08/01/2016   Allergic contact dermatitis due to metals 08/01/2016   Migraine without aura and without status migrainosus, not intractable 07/22/2016   Spasm of muscle of lower back 06/21/2016   Chronic rhinitis 06/19/2016   Compliance poor 06/19/2016   Gait difficulty 06/21/2015   Fibromyalgia 06/21/2015   Conversion reaction 06/21/2015   Anxiety and depression 06/21/2015   Syncope and collapse 06/21/2015   Plica syndrome of left knee 01/06/2015   Anxiety state 09/06/2014   History of MI 09/06/2014   Cigarette smoker 06/27/2013   Borderline hypertension 06/27/2013   Chronic low back pain with sciatica 06/14/2013   Leg pain, bilateral 06/14/2013   Gastroparesis 10/26/2009   Musculoskeletal chest pain 05/02/2009    Hyperlipidemia LDL goal <100 11/18/2008   COPD exacerbation (HCC) 11/18/2008   HEPATIC CYST 11/18/2008   PCP:  Corwin Levins, MD Pharmacy:   [facility] [address], [place] - [address] [place] [address] Phone: [phone] Fax: [phone]  [id number] [place], [place] - [address] AT [facility] & [address] [place] [address] Phone: [phone] Fax: [phone]     Social Determinants of Health (SDOH) Social History: SDOH Screenings   Food Insecurity: No Food Insecurity (05/15/2023)  Housing: Low Risk  (05/15/2023)  Transportation Needs: No Transportation Needs (05/15/2023)  Utilities: Not At Risk (05/15/2023)  Alcohol Screen: Low Risk  (08/02/2022)  Depression (PHQ2-9): Medium Risk (04/30/2023)  Financial Resource Strain: Low Risk  (08/02/2022)  Physical Activity: Inactive (08/02/2022)  Social Connections: Unknown (08/02/2022)  Stress: Stress Concern Present (08/02/2022)  Tobacco Use: High Risk (05/13/2023)   SDOH Interventions: Food Insecurity Interventions: Intervention Not Indicated, Inpatient TOC Housing Interventions: Intervention Not Indicated, Inpatient TOC Transportation Interventions: Intervention Not Indicated, Inpatient TOC Utilities Interventions: Intervention Not Indicated, Inpatient TOC   Readmission Risk Interventions     No data to display

## 2023-05-15 NOTE — Assessment & Plan Note (Addendum)
-   Likely multifactorial in setting of ongoing tobacco use plus superimposed pneumonia - Continue steroids and breathing treatments

## 2023-05-15 NOTE — Assessment & Plan Note (Signed)
Continue Protonix °

## 2023-05-15 NOTE — Assessment & Plan Note (Signed)
-   Ongoing tobacco use for which she has been strongly encouraged to quit -States she is still smoking approximately 1/2 PPD though suspect she is underreporting

## 2023-05-15 NOTE — Progress Notes (Signed)
Progress Note    Lori [name]   [id number]  DOB: 02/11/1956  DOA: 05/13/2023     0 PCP: Corwin Levins, MD  Initial CC: dyspnea  Hospital Course: Ms. Vivar is a 67 yo female with PMH COPD, ongoing tobacco use, hiatal hernia, HLD who presented with worsening dyspnea and cough.  She has productive white sputum at baseline but other symptoms have been going on for approximately 6 weeks she stated.  Home breathing treatments were not working.  She had not been on any recent antibiotics or steroids prior to admission.  She ultimately presented due to no improvement in her symptoms. CT angio chest was performed which was negative for PE.  Bibasilar atelectasis with possible superimposed infiltrate was appreciated.  She was started on Rocephin and azithromycin and admitted for further treatment of pneumonia and COPD exacerbation.  Interval History:  Resting in bed appearing anxious when seen. Updated daughter on phone in room as well. Discussed smoking cessation as well today.  Still too dyspneic for going home and anxiety/pain also driving it. Needing to address pain further today and continue IV steroids and breathing treatments.  Mostly complaining of pain in her right side from coughing.   Assessment and Plan: * CAP (community acquired pneumonia) - CT angio chest shows infiltrates notably more prominent in the left lower lobe compared to right lower lobe -WBC elevated on admission as well - Continue Rocephin and azithromycin  COPD exacerbation (HCC) - Likely multifactorial in setting of ongoing tobacco use plus superimposed pneumonia - Continue steroids and breathing treatments  Right-sided chest pain - Problem noted already on her problem list but she is complaining of acutely worsened right sided chest wall pain from excessive coughing recently -Trial of lidocaine patch and Robaxin - Short trial of oxycodone for breakthrough pain and start on scheduled  Tylenol  Hypomagnesemia - Replete as needed  Tobacco abuse - Ongoing tobacco use for which she has been strongly encouraged to quit -States she is still smoking approximately 1/2 PPD though suspect she is underreporting  Gastroesophageal reflux disease - Continue Protonix  Anxiety and depression - Continue Celexa and Xanax (verified on database)   Old records reviewed in assessment of this patient  Antimicrobials: Azithro 8/28 >> current Rocephin 8/28 >> current   DVT prophylaxis:  enoxaparin (LOVENOX) injection 40 mg Start: 05/14/23 1000 SCDs Start: 05/14/23 0828   Code Status:   Code Status: Full Code  Mobility Assessment (Last 72 Hours)     Mobility Assessment     Row Name 05/15/23 0900 05/14/23 2100 05/14/23 1531       Does patient have an order for bedrest or is patient medically unstable No - Continue assessment No - Continue assessment No - Continue assessment     What is the highest level of mobility based on the progressive mobility assessment? Level 5 (Walks with assist in room/hall) - Balance while stepping forward/back and can walk in room with assist - Complete Level 5 (Walks with assist in room/hall) - Balance while stepping forward/back and can walk in room with assist - Complete Level 5 (Walks with assist in room/hall) - Balance while stepping forward/back and can walk in room with assist - Complete              Barriers to discharge: none Disposition Plan:  Home Status is: Obs  Objective: Blood pressure 100/67, pulse 98, temperature 98.3 F (36.8 C), temperature source Oral, resp. rate 16, height 5\' 4"  (1.626 m),  weight 53.5 kg, SpO2 95%.  Examination:  Physical Exam Constitutional:      General: She is not in acute distress.    Appearance: She is well-developed.     Comments: Anxious appearing  HENT:     Head: Normocephalic and atraumatic.     Mouth/Throat:     Mouth: Mucous membranes are moist.  Eyes:     Extraocular Movements:  Extraocular movements intact.  Cardiovascular:     Rate and Rhythm: Normal rate and regular rhythm.  Pulmonary:     Effort: Pulmonary effort is normal. No respiratory distress.     Breath sounds: Rhonchi present. No wheezing.  Abdominal:     General: Bowel sounds are normal. There is no distension.     Palpations: Abdomen is soft.     Tenderness: There is no abdominal tenderness.  Musculoskeletal:        General: Normal range of motion.     Cervical back: Normal range of motion and neck supple.  Skin:    General: Skin is warm and dry.  Neurological:     General: No focal deficit present.     Mental Status: She is alert.  Psychiatric:        Mood and Affect: Mood is anxious.      Consultants:    Procedures:    Data Reviewed: Results for orders placed or performed during the hospital encounter of 05/13/23 (from the past 24 hour(s))  HIV Antibody (routine testing w rflx)     Status: None   Collection Time: 05/15/23  5:38 AM  Result Value Ref Range   HIV Screen 4th Generation wRfx Non Reactive Non Reactive  Basic metabolic panel     Status: Abnormal   Collection Time: 05/15/23  5:38 AM  Result Value Ref Range   Sodium 136 135 - 145 mmol/L   Potassium 4.8 3.5 - 5.1 mmol/L   Chloride 105 98 - 111 mmol/L   CO2 21 (L) 22 - 32 mmol/L   Glucose, Bld 126 (H) 70 - 99 mg/dL   BUN 14 8 - 23 mg/dL   Creatinine, Ser 8.75 0.44 - 1.00 mg/dL   Calcium 8.7 (L) 8.9 - 10.3 mg/dL   GFR, Estimated >64 >33 mL/min   Anion gap 10 5 - 15  CBC     Status: Abnormal   Collection Time: 05/15/23  5:38 AM  Result Value Ref Range   WBC 16.7 (H) 4.0 - 10.5 K/uL   RBC 4.03 3.87 - 5.11 MIL/uL   Hemoglobin 13.2 12.0 - 15.0 g/dL   HCT 29.5 18.8 - 41.6 %   MCV 99.0 80.0 - 100.0 fL   MCH 32.8 26.0 - 34.0 pg   MCHC 33.1 30.0 - 36.0 g/dL   RDW 60.6 30.1 - 60.1 %   Platelets 265 150 - 400 K/uL   nRBC 0.0 0.0 - 0.2 %   *Note: Due to a large number of results and/or encounters for the requested time  period, some results have not been displayed. A complete set of results can be found in Results Review.    I have reviewed pertinent nursing notes, vitals, labs, and images as necessary. I have ordered labwork to follow up on as indicated.  I have reviewed the last notes from staff over past 24 hours. I have discussed patient's care plan and test results with nursing staff, CM/SW, and other staff as appropriate.  Time spent: Greater than 50% of the 55 minute visit was spent in  counseling/coordination of care for the patient as laid out in the A&P.   LOS: 0 days   Lewie Chamber, MD [facility] 05/15/2023, 2:31 PM

## 2023-05-15 NOTE — Care Management Obs Status (Signed)
MEDICARE OBSERVATION STATUS NOTIFICATION   Patient Details  Name: Lori [name] MRN: [id number] Date of Birth: 1955-11-17   Medicare Observation Status Notification Given:  Yes    Adrian Prows, RN 05/15/2023, 9:29 AM

## 2023-05-16 DIAGNOSIS — Z90710 Acquired absence of both cervix and uterus: Secondary | ICD-10-CM | POA: Diagnosis not present

## 2023-05-16 DIAGNOSIS — E785 Hyperlipidemia, unspecified: Secondary | ICD-10-CM | POA: Diagnosis present

## 2023-05-16 DIAGNOSIS — F419 Anxiety disorder, unspecified: Secondary | ICD-10-CM | POA: Diagnosis present

## 2023-05-16 DIAGNOSIS — Z79899 Other long term (current) drug therapy: Secondary | ICD-10-CM | POA: Diagnosis not present

## 2023-05-16 DIAGNOSIS — J189 Pneumonia, unspecified organism: Secondary | ICD-10-CM | POA: Diagnosis present

## 2023-05-16 DIAGNOSIS — Z886 Allergy status to analgesic agent status: Secondary | ICD-10-CM | POA: Diagnosis not present

## 2023-05-16 DIAGNOSIS — Z833 Family history of diabetes mellitus: Secondary | ICD-10-CM | POA: Diagnosis not present

## 2023-05-16 DIAGNOSIS — J440 Chronic obstructive pulmonary disease with acute lower respiratory infection: Secondary | ICD-10-CM | POA: Diagnosis present

## 2023-05-16 DIAGNOSIS — D649 Anemia, unspecified: Secondary | ICD-10-CM | POA: Diagnosis present

## 2023-05-16 DIAGNOSIS — R079 Chest pain, unspecified: Secondary | ICD-10-CM | POA: Diagnosis not present

## 2023-05-16 DIAGNOSIS — Z8249 Family history of ischemic heart disease and other diseases of the circulatory system: Secondary | ICD-10-CM | POA: Diagnosis not present

## 2023-05-16 DIAGNOSIS — F32A Depression, unspecified: Secondary | ICD-10-CM | POA: Diagnosis present

## 2023-05-16 DIAGNOSIS — F17210 Nicotine dependence, cigarettes, uncomplicated: Secondary | ICD-10-CM | POA: Diagnosis present

## 2023-05-16 DIAGNOSIS — Z888 Allergy status to other drugs, medicaments and biological substances status: Secondary | ICD-10-CM | POA: Diagnosis not present

## 2023-05-16 DIAGNOSIS — Z91030 Bee allergy status: Secondary | ICD-10-CM | POA: Diagnosis not present

## 2023-05-16 DIAGNOSIS — Z885 Allergy status to narcotic agent status: Secondary | ICD-10-CM | POA: Diagnosis not present

## 2023-05-16 DIAGNOSIS — K219 Gastro-esophageal reflux disease without esophagitis: Secondary | ICD-10-CM | POA: Diagnosis present

## 2023-05-16 DIAGNOSIS — J441 Chronic obstructive pulmonary disease with (acute) exacerbation: Secondary | ICD-10-CM | POA: Diagnosis present

## 2023-05-16 DIAGNOSIS — Z825 Family history of asthma and other chronic lower respiratory diseases: Secondary | ICD-10-CM | POA: Diagnosis not present

## 2023-05-16 DIAGNOSIS — Z881 Allergy status to other antibiotic agents status: Secondary | ICD-10-CM | POA: Diagnosis not present

## 2023-05-16 DIAGNOSIS — Z91040 Latex allergy status: Secondary | ICD-10-CM | POA: Diagnosis not present

## 2023-05-16 DIAGNOSIS — K2270 Barrett's esophagus without dysplasia: Secondary | ICD-10-CM | POA: Diagnosis present

## 2023-05-16 DIAGNOSIS — J439 Emphysema, unspecified: Secondary | ICD-10-CM | POA: Diagnosis present

## 2023-05-16 LAB — BASIC METABOLIC PANEL
Anion gap: 10 (ref 5–15)
BUN: 17 mg/dL (ref 8–23)
CO2: 19 mmol/L — ABNORMAL LOW (ref 22–32)
Calcium: 8.4 mg/dL — ABNORMAL LOW (ref 8.9–10.3)
Chloride: 104 mmol/L (ref 98–111)
Creatinine, Ser: 0.9 mg/dL (ref 0.44–1.00)
GFR, Estimated: >60 mL/min (ref >60)
Glucose, Bld: 147 mg/dL — ABNORMAL HIGH (ref 70–99)
Potassium: 4.5 mmol/L (ref 3.5–5.1)
Sodium: 133 mmol/L — ABNORMAL LOW (ref 135–145)

## 2023-05-16 LAB — CBC WITH DIFFERENTIAL/PLATELET
Abs Immature Granulocytes: 0.35 10*3/uL — ABNORMAL HIGH (ref 0.00–0.07)
Basophils Absolute: 0.1 10*3/uL (ref 0.0–0.1)
Basophils Relative: 0 %
Eosinophils Absolute: 0 10*3/uL (ref 0.0–0.5)
Eosinophils Relative: 0 %
HCT: 39.2 % (ref 36.0–46.0)
Hemoglobin: 13.1 g/dL (ref 12.0–15.0)
Immature Granulocytes: 3 %
Lymphocytes Relative: 8 %
Lymphs Abs: 1.1 10*3/uL (ref 0.7–4.0)
MCH: 33.2 pg (ref 26.0–34.0)
MCHC: 33.4 g/dL (ref 30.0–36.0)
MCV: 99.2 fL (ref 80.0–100.0)
Monocytes Absolute: 0.3 10*3/uL (ref 0.1–1.0)
Monocytes Relative: 3 %
Neutro Abs: 10.9 10*3/uL — ABNORMAL HIGH (ref 1.7–7.7)
Neutrophils Relative %: 86 %
Platelets: 236 10*3/uL (ref 150–400)
RBC: 3.95 MIL/uL (ref 3.87–5.11)
RDW: 13.3 % (ref 11.5–15.5)
WBC: 12.7 10*3/uL — ABNORMAL HIGH (ref 4.0–10.5)
nRBC: 0 % (ref 0.0–0.2)

## 2023-05-16 LAB — MAGNESIUM: Magnesium: 2.1 mg/dL (ref 1.7–2.4)

## 2023-05-16 MED ORDER — BUDESONIDE 0.25 MG/2ML IN SUSP
0.2500 mg | Freq: Two times a day (BID) | RESPIRATORY_TRACT | Status: DC
  Administered 2023-05-16 – 2023-05-19 (×7): 0.25 mg via RESPIRATORY_TRACT
  Filled 2023-05-16 (×7): qty 2

## 2023-05-16 MED ORDER — AZITHROMYCIN 250 MG PO TABS
500.0000 mg | ORAL_TABLET | Freq: Every day | ORAL | Status: AC
  Administered 2023-05-16 – 2023-05-17 (×2): 500 mg via ORAL
  Filled 2023-05-16 (×2): qty 2

## 2023-05-16 MED ORDER — ARFORMOTEROL TARTRATE 15 MCG/2ML IN NEBU
15.0000 ug | INHALATION_SOLUTION | Freq: Two times a day (BID) | RESPIRATORY_TRACT | Status: DC
  Administered 2023-05-16 – 2023-05-19 (×7): 15 ug via RESPIRATORY_TRACT
  Filled 2023-05-16 (×7): qty 2

## 2023-05-16 NOTE — Plan of Care (Signed)

## 2023-05-16 NOTE — Progress Notes (Signed)
Progress Note    Lori [name]   [id number]  DOB: Aug 18, 1956  DOA: 05/13/2023     0 PCP: Corwin Levins, MD  Initial CC: dyspnea  Hospital Course: Lori [name] is a 67 yo female with PMH COPD, ongoing tobacco use, hiatal hernia, HLD who presented with worsening dyspnea and cough.  She has productive white sputum at baseline but other symptoms have been going on for approximately 6 weeks she stated.  Home breathing treatments were not working.  She had not been on any recent antibiotics or steroids prior to admission.  She ultimately presented due to no improvement in her symptoms. CT angio chest was performed which was negative for PE.  Bibasilar atelectasis with possible superimposed infiltrate was appreciated.  She was started on Rocephin and azithromycin and admitted for further treatment of pneumonia and COPD exacerbation.  Interval History:  No events overnight but she is certainly more short of breath today.  Audible wheezing and tighter breath sounds on exam.  Will continue hospitalization and expand nebulizer treatments some.  Assessment and Plan: * CAP (community acquired pneumonia) - CT angio chest shows infiltrates notably more prominent in the left lower lobe compared to right lower lobe -WBC elevated on admission as well - Continue Rocephin and azithromycin  COPD exacerbation (HCC) - Likely multifactorial in setting of ongoing tobacco use plus superimposed pneumonia - Continue steroids and breathing treatments -More wheezing and tighter breath sounds today.  Adding on Pulmicort and Brovana  Right-sided chest pain - Problem noted already on her problem list but she is complaining of acutely worsened right sided chest wall pain from excessive coughing recently -Trial of lidocaine patch and Robaxin - Short trial of oxycodone for breakthrough pain and start on scheduled Tylenol  Hypomagnesemia - Replete as needed  Tobacco abuse - Ongoing tobacco use for which she  has been strongly encouraged to quit -States she is still smoking approximately 1/2 PPD though suspect she is underreporting  Gastroesophageal reflux disease - Continue Protonix  Anxiety and depression - Continue Celexa and Xanax (verified on database)   Old records reviewed in assessment of this patient  Antimicrobials: Azithro 8/28 >> current Rocephin 8/28 >> current   DVT prophylaxis:  enoxaparin (LOVENOX) injection 40 mg Start: 05/14/23 1000 SCDs Start: 05/14/23 0828   Code Status:   Code Status: Full Code  Mobility Assessment (Last 72 Hours)     Mobility Assessment     Row Name 05/16/23 1008 05/15/23 1922 05/15/23 0900 05/14/23 2100 05/14/23 1531   Does patient have an order for bedrest or is patient medically unstable No - Continue assessment No - Continue assessment No - Continue assessment No - Continue assessment No - Continue assessment   What is the highest level of mobility based on the progressive mobility assessment? Level 5 (Walks with assist in room/hall) - Balance while stepping forward/back and can walk in room with assist - Complete Level 5 (Walks with assist in room/hall) - Balance while stepping forward/back and can walk in room with assist - Complete Level 5 (Walks with assist in room/hall) - Balance while stepping forward/back and can walk in room with assist - Complete Level 5 (Walks with assist in room/hall) - Balance while stepping forward/back and can walk in room with assist - Complete Level 5 (Walks with assist in room/hall) - Balance while stepping forward/back and can walk in room with assist - Complete            Barriers to discharge:  none Disposition Plan:  Home Status is: Obs  Objective: Blood pressure (!) 144/68, pulse 92, temperature (!) 97.4 F (36.3 C), temperature source Oral, resp. rate 16, height 5\' 4"  (1.626 m), weight 53.5 kg, SpO2 96%.  Examination:  Physical Exam Constitutional:      General: She is not in acute distress.     Appearance: She is well-developed.     Comments: Slightly less anxious appearing but more short of breath  HENT:     Head: Normocephalic and atraumatic.     Mouth/Throat:     Mouth: Mucous membranes are moist.  Eyes:     Extraocular Movements: Extraocular movements intact.  Cardiovascular:     Rate and Rhythm: Normal rate and regular rhythm.  Pulmonary:     Effort: Pulmonary effort is normal. No respiratory distress.     Breath sounds: Wheezing and rhonchi present.     Comments: Tight breath sounds Abdominal:     General: Bowel sounds are normal. There is no distension.     Palpations: Abdomen is soft.     Tenderness: There is no abdominal tenderness.  Musculoskeletal:        General: Normal range of motion.     Cervical back: Normal range of motion and neck supple.  Skin:    General: Skin is warm and dry.  Neurological:     General: No focal deficit present.     Mental Status: She is alert.  Psychiatric:        Mood and Affect: Mood is anxious.      Consultants:    Procedures:    Data Reviewed: Results for orders placed or performed during the hospital encounter of 05/13/23 (from the past 24 hour(s))  CBC with Differential/Platelet     Status: Abnormal   Collection Time: 05/16/23  5:29 AM  Result Value Ref Range   WBC 12.7 (H) 4.0 - 10.5 K/uL   RBC 3.95 3.87 - 5.11 MIL/uL   Hemoglobin 13.1 12.0 - 15.0 g/dL   HCT 62.1 30.8 - 65.7 %   MCV 99.2 80.0 - 100.0 fL   MCH 33.2 26.0 - 34.0 pg   MCHC 33.4 30.0 - 36.0 g/dL   RDW 84.6 96.2 - 95.2 %   Platelets 236 150 - 400 K/uL   nRBC 0.0 0.0 - 0.2 %   Neutrophils Relative % 86 %   Neutro Abs 10.9 (H) 1.7 - 7.7 K/uL   Lymphocytes Relative 8 %   Lymphs Abs 1.1 0.7 - 4.0 K/uL   Monocytes Relative 3 %   Monocytes Absolute 0.3 0.1 - 1.0 K/uL   Eosinophils Relative 0 %   Eosinophils Absolute 0.0 0.0 - 0.5 K/uL   Basophils Relative 0 %   Basophils Absolute 0.1 0.0 - 0.1 K/uL   Immature Granulocytes 3 %   Abs Immature  Granulocytes 0.35 (H) 0.00 - 0.07 K/uL  Basic metabolic panel     Status: Abnormal   Collection Time: 05/16/23  5:29 AM  Result Value Ref Range   Sodium 133 (L) 135 - 145 mmol/L   Potassium 4.5 3.5 - 5.1 mmol/L   Chloride 104 98 - 111 mmol/L   CO2 19 (L) 22 - 32 mmol/L   Glucose, Bld 147 (H) 70 - 99 mg/dL   BUN 17 8 - 23 mg/dL   Creatinine, Ser 8.41 0.44 - 1.00 mg/dL   Calcium 8.4 (L) 8.9 - 10.3 mg/dL   GFR, Estimated >32 >44 mL/min   Anion gap 10  5 - 15  Magnesium     Status: None   Collection Time: 05/16/23  5:29 AM  Result Value Ref Range   Magnesium 2.1 1.7 - 2.4 mg/dL   *Note: Due to a large number of results and/or encounters for the requested time period, some results have not been displayed. A complete set of results can be found in Results Review.    I have reviewed pertinent nursing notes, vitals, labs, and images as necessary. I have ordered labwork to follow up on as indicated.  I have reviewed the last notes from staff over past 24 hours. I have discussed patient's care plan and test results with nursing staff, CM/SW, and other staff as appropriate.  Time spent: Greater than 50% of the 55 minute visit was spent in counseling/coordination of care for the patient as laid out in the A&P.   LOS: 0 days   Lewie Chamber, MD [facility] 05/16/2023, 3:13 PM

## 2023-05-17 DIAGNOSIS — J189 Pneumonia, unspecified organism: Secondary | ICD-10-CM | POA: Diagnosis not present

## 2023-05-17 DIAGNOSIS — J441 Chronic obstructive pulmonary disease with (acute) exacerbation: Secondary | ICD-10-CM | POA: Diagnosis not present

## 2023-05-17 NOTE — Progress Notes (Signed)
Progress Note    Lori [name]   [id number]  DOB: Feb 27, 1956  DOA: 05/13/2023     1 PCP: Corwin Levins, MD  Initial CC: dyspnea  Hospital Course: Lori [name] is a 67 yo female with PMH COPD, ongoing tobacco use, hiatal hernia, HLD who presented with worsening dyspnea and cough.  She has productive white sputum at baseline but other symptoms have been going on for approximately 6 weeks she stated.  Home breathing treatments were not working.  She had not been on any recent antibiotics or steroids prior to admission.  She ultimately presented due to no improvement in her symptoms. CT angio chest was performed which was negative for PE.  Bibasilar atelectasis with possible superimposed infiltrate was appreciated.  She was started on Rocephin and azithromycin and admitted for further treatment of pneumonia and COPD exacerbation.  Interval History:  Still having wheezing and dyspnea this morning.  She is moving a little bit better air but still audible wheezing. We will continue same treatment today.  Assessment and Plan: * CAP (community acquired pneumonia) - CT angio chest shows infiltrates notably more prominent in the left lower lobe compared to right lower lobe -WBC elevated on admission as well - Continue Rocephin and azithromycin  COPD exacerbation (HCC) - Likely multifactorial in setting of ongoing tobacco use plus superimposed pneumonia - Continue steroids and breathing treatments  Right-sided chest pain - Problem noted already on her problem list but she is complaining of acutely worsened right sided chest wall pain from excessive coughing recently -Trial of lidocaine patch and Robaxin - Short trial of oxycodone for breakthrough pain and start on scheduled Tylenol  Hypomagnesemia - Replete as needed  Tobacco abuse - Ongoing tobacco use for which she has been strongly encouraged to quit -States she is still smoking approximately 1/2 PPD though suspect she is  underreporting  Gastroesophageal reflux disease - Continue Protonix  Anxiety and depression - Continue Celexa and Xanax (verified on database)   Old records reviewed in assessment of this patient  Antimicrobials: Azithro 8/28 >> current Rocephin 8/28 >> current   DVT prophylaxis:  enoxaparin (LOVENOX) injection 40 mg Start: 05/14/23 1000 SCDs Start: 05/14/23 0828   Code Status:   Code Status: Full Code  Mobility Assessment (Last 72 Hours)     Mobility Assessment     Row Name 05/17/23 0827 05/16/23 2100 05/16/23 1008 05/15/23 1922 05/15/23 0900   Does patient have an order for bedrest or is patient medically unstable No - Continue assessment No - Continue assessment No - Continue assessment No - Continue assessment No - Continue assessment   What is the highest level of mobility based on the progressive mobility assessment? Level 5 (Walks with assist in room/hall) - Balance while stepping forward/back and can walk in room with assist - Complete Level 5 (Walks with assist in room/hall) - Balance while stepping forward/back and can walk in room with assist - Complete Level 5 (Walks with assist in room/hall) - Balance while stepping forward/back and can walk in room with assist - Complete Level 5 (Walks with assist in room/hall) - Balance while stepping forward/back and can walk in room with assist - Complete Level 5 (Walks with assist in room/hall) - Balance while stepping forward/back and can walk in room with assist - Complete    Row Name 05/14/23 2100           Does patient have an order for bedrest or is patient medically unstable No -  Continue assessment       What is the highest level of mobility based on the progressive mobility assessment? Level 5 (Walks with assist in room/hall) - Balance while stepping forward/back and can walk in room with assist - Complete                Barriers to discharge: none Disposition Plan:  Home Status is: Obs  Objective: Blood  pressure (!) 147/70, pulse 84, temperature 97.6 F (36.4 C), temperature source Oral, resp. rate 18, height 5\' 4"  (1.626 m), weight 53.5 kg, SpO2 95%.  Examination:  Physical Exam Constitutional:      General: She is not in acute distress.    Appearance: She is well-developed.     Comments: Less anxious and less short of breath today  HENT:     Head: Normocephalic and atraumatic.     Mouth/Throat:     Mouth: Mucous membranes are moist.  Eyes:     Extraocular Movements: Extraocular movements intact.  Cardiovascular:     Rate and Rhythm: Normal rate and regular rhythm.  Pulmonary:     Effort: Pulmonary effort is normal. No respiratory distress.     Breath sounds: Wheezing and rhonchi present.     Comments: Tight breath sounds but improved today Abdominal:     General: Bowel sounds are normal. There is no distension.     Palpations: Abdomen is soft.     Tenderness: There is no abdominal tenderness.  Musculoskeletal:        General: Normal range of motion.     Cervical back: Normal range of motion and neck supple.  Skin:    General: Skin is warm and dry.  Neurological:     General: No focal deficit present.     Mental Status: She is alert.  Psychiatric:        Mood and Affect: Mood is anxious.      Consultants:    Procedures:    Data Reviewed: No results found. However, due to the size of the patient record, not all encounters were searched. Please check Results Review for a complete set of results.   I have reviewed pertinent nursing notes, vitals, labs, and images as necessary. I have ordered labwork to follow up on as indicated.  I have reviewed the last notes from staff over past 24 hours. I have discussed patient's care plan and test results with nursing staff, CM/SW, and other staff as appropriate.  Time spent: Greater than 50% of the 55 minute visit was spent in counseling/coordination of care for the patient as laid out in the A&P.   LOS: 1 day   Lewie Chamber, MD [facility] 05/17/2023, 4:56 PM

## 2023-05-17 NOTE — Plan of Care (Signed)

## 2023-05-18 DIAGNOSIS — R079 Chest pain, unspecified: Secondary | ICD-10-CM | POA: Diagnosis not present

## 2023-05-18 DIAGNOSIS — J441 Chronic obstructive pulmonary disease with (acute) exacerbation: Secondary | ICD-10-CM | POA: Diagnosis not present

## 2023-05-18 DIAGNOSIS — J189 Pneumonia, unspecified organism: Secondary | ICD-10-CM | POA: Diagnosis not present

## 2023-05-18 MED ORDER — ALPRAZOLAM 0.5 MG PO TABS
1.0000 mg | ORAL_TABLET | Freq: Every day | ORAL | Status: DC | PRN
  Administered 2023-05-18 – 2023-05-19 (×2): 1 mg via ORAL
  Filled 2023-05-18 (×2): qty 2

## 2023-05-18 NOTE — Progress Notes (Signed)
Progress Note    Lori [name]   ZOX:[id number]  DOB: [birth year]  DOA: 05/13/2023     2 PCP: Corwin Levins, MD  Initial CC: dyspnea  Hospital Course: Lori [name] is a 67 yo female with PMH COPD, ongoing tobacco use, hiatal hernia, HLD who presented with worsening dyspnea and cough.  She has productive white sputum at baseline but other symptoms have been going on for approximately 6 weeks she stated.  Home breathing treatments were not working.  She had not been on any recent antibiotics or steroids prior to admission.  She ultimately presented due to no improvement in her symptoms. CT angio chest was performed which was negative for PE.  Bibasilar atelectasis with possible superimposed infiltrate was appreciated.  She was started on Rocephin and azithromycin and admitted for further treatment of pneumonia and COPD exacerbation.  Interval History:  Breath sounds improving further today.  Wheezing has increased but expected in setting of improving breath sounds.  She still complains of right sided chest wall pains from coughing.  Remains off oxygen.  Otherwise has improved some and appears more comfortable.  Assessment and Plan: * CAP (community acquired pneumonia) - CT angio chest shows infiltrates notably more prominent in the left lower lobe compared to right lower lobe -WBC elevated on admission as well - Continue Rocephin and azithromycin  COPD exacerbation (HCC) - Likely multifactorial in setting of ongoing tobacco use plus superimposed pneumonia - Continue steroids and breathing treatments  Right-sided chest pain - Problem noted already on her problem list but she is complaining of acutely worsened right sided chest wall pain from excessive coughing recently -Trial of lidocaine patch and Robaxin - Short trial of oxycodone for breakthrough pain and start on scheduled Tylenol  Hypomagnesemia - Replete as needed  Tobacco abuse - Ongoing tobacco use for which she has been  strongly encouraged to quit -States she is still smoking approximately 1/2 PPD though suspect she is underreporting  Gastroesophageal reflux disease - Continue Protonix  Anxiety and depression - Continue Celexa and Xanax (verified on database)   Old records reviewed in assessment of this patient  Antimicrobials: Azithro 8/28 >> current Rocephin 8/28 >> current   DVT prophylaxis:  enoxaparin (LOVENOX) injection 40 mg Start: 05/14/23 1000 SCDs Start: 05/14/23 0828   Code Status:   Code Status: Full Code  Mobility Assessment (Last 72 Hours)     Mobility Assessment     Row Name 05/18/23 1100 05/17/23 2030 05/17/23 0827 05/16/23 2100 05/16/23 1008   Does patient have an order for bedrest or is patient medically unstable No - Continue assessment No - Continue assessment No - Continue assessment No - Continue assessment No - Continue assessment   What is the highest level of mobility based on the progressive mobility assessment? Level 5 (Walks with assist in room/hall) - Balance while stepping forward/back and can walk in room with assist - Complete Level 5 (Walks with assist in room/hall) - Balance while stepping forward/back and can walk in room with assist - Complete Level 5 (Walks with assist in room/hall) - Balance while stepping forward/back and can walk in room with assist - Complete Level 5 (Walks with assist in room/hall) - Balance while stepping forward/back and can walk in room with assist - Complete Level 5 (Walks with assist in room/hall) - Balance while stepping forward/back and can walk in room with assist - Complete    Row Name 05/15/23 [place]  Does patient have an order for bedrest or is patient medically unstable No - Continue assessment       What is the highest level of mobility based on the progressive mobility assessment? Level 5 (Walks with assist in room/hall) - Balance while stepping forward/back and can walk in room with assist - Complete                 Barriers to discharge: none Disposition Plan:  Home Status is: Obs  Objective: Blood pressure 120/65, pulse 92, temperature 97.6 F (36.4 C), temperature source Oral, resp. rate 18, height 5\' 4"  (1.626 m), weight 53.5 kg, SpO2 95%.  Examination:  Physical Exam Constitutional:      General: She is not in acute distress.    Appearance: She is well-developed.     Comments: Less anxious and less short of breath today  HENT:     Head: Normocephalic and atraumatic.     Mouth/Throat:     Mouth: Mucous membranes are moist.  Eyes:     Extraocular Movements: Extraocular movements intact.  Cardiovascular:     Rate and Rhythm: Normal rate and regular rhythm.  Pulmonary:     Effort: Pulmonary effort is normal. No respiratory distress.     Breath sounds: Wheezing and rhonchi present.     Comments: Breath sounds continuing to loosen up Abdominal:     General: Bowel sounds are normal. There is no distension.     Palpations: Abdomen is soft.     Tenderness: There is no abdominal tenderness.  Musculoskeletal:        General: Normal range of motion.     Cervical back: Normal range of motion and neck supple.  Skin:    General: Skin is warm and dry.  Neurological:     General: No focal deficit present.     Mental Status: She is alert.  Psychiatric:        Mood and Affect: Mood is anxious.      Consultants:    Procedures:    Data Reviewed: No results found. However, due to the size of the patient record, not all encounters were searched. Please check Results Review for a complete set of results.   I have reviewed pertinent nursing notes, vitals, labs, and images as necessary. I have ordered labwork to follow up on as indicated.  I have reviewed the last notes from staff over past 24 hours. I have discussed patient's care plan and test results with nursing staff, CM/SW, and other staff as appropriate.  Time spent: Greater than 50% of the 55 minute visit was spent in  counseling/coordination of care for the patient as laid out in the A&P.   LOS: 2 days   Lewie Chamber, MD [facility] 05/18/2023, 3:07 PM

## 2023-05-18 NOTE — Plan of Care (Signed)
Problem: Education: Goal: Knowledge of General Education information will improve Description Including pain rating scale, medication(s)/side effects and non-pharmacologic comfort measures Outcome: Progressing   Problem: Health Behavior/Discharge Planning: Goal: Ability to manage health-related needs will improve Outcome: Progressing   Problem: Clinical Measurements: Goal: Will remain free from infection Outcome: Progressing   Problem: Nutrition: Goal: Adequate nutrition will be maintained Outcome: Progressing   Problem: Coping: Goal: Level of anxiety will decrease Outcome: Progressing   Problem: Pain Managment: Goal: General experience of comfort will improve Outcome: Progressing   Problem: Safety: Goal: Ability to remain free from injury will improve Outcome: Progressing   Problem: Skin Integrity: Goal: Risk for impaired skin integrity will decrease Outcome: Progressing

## 2023-05-18 NOTE — Plan of Care (Signed)
  Problem: Activity: Goal: Risk for activity intolerance will decrease Outcome: Progressing   Problem: Nutrition: Goal: Adequate nutrition will be maintained Outcome: Progressing   Problem: Pain Managment: Goal: General experience of comfort will improve Outcome: Progressing   

## 2023-05-18 NOTE — Progress Notes (Signed)
Mobility Specialist - Progress Note   05/18/23 1141  Mobility  Activity Ambulated with assistance in hallway  Level of Assistance Contact guard assist, steadying assist  Assistive Device  (hand held assist)  Distance Ambulated (ft) 160 ft  Activity Response Tolerated well  Mobility Referral Yes  $Mobility charge 1 Mobility  Mobility Specialist Start Time (ACUTE ONLY) 1125  Mobility Specialist Stop Time (ACUTE ONLY) 1140  Mobility Specialist Time Calculation (min) (ACUTE ONLY) 15 min   Pt received in bed and agreeable to mobility. Pt declined use of walker, requesting hand held assistance. Upon returning to room pt c/o knee pain. Heat packs provided. Pt to bed after session with all needs met. Nurse in room.   [facility]

## 2023-05-19 DIAGNOSIS — J189 Pneumonia, unspecified organism: Secondary | ICD-10-CM | POA: Diagnosis not present

## 2023-05-19 DIAGNOSIS — J441 Chronic obstructive pulmonary disease with (acute) exacerbation: Secondary | ICD-10-CM | POA: Diagnosis not present

## 2023-05-19 DIAGNOSIS — R079 Chest pain, unspecified: Secondary | ICD-10-CM | POA: Diagnosis not present

## 2023-05-19 LAB — CULTURE, BLOOD (ROUTINE X 2)
Culture: NO GROWTH
Culture: NO GROWTH
Special Requests: ADEQUATE

## 2023-05-19 MED ORDER — PREDNISONE 20 MG PO TABS: 40.0000 mg | ORAL_TABLET | Freq: Every day | ORAL | 0 refills | Status: AC

## 2023-05-19 MED ORDER — IPRATROPIUM-ALBUTEROL 0.5-2.5 (3) MG/3ML IN SOLN: 3.0000 mL | Freq: Two times a day (BID) | RESPIRATORY_TRACT | Status: DC

## 2023-05-19 MED ORDER — PREDNISONE 20 MG PO TABS: 40.0000 mg | ORAL_TABLET | Freq: Every day | ORAL | 0 refills | Status: DC

## 2023-05-19 NOTE — Progress Notes (Signed)
Mobility Specialist - Progress Note   05/19/23 1035  Mobility  Activity Ambulated independently in hallway  Level of Assistance Independent  Assistive Device None  Distance Ambulated (ft) 480 ft  Activity Response Tolerated well  Mobility Referral Yes  $Mobility charge 1 Mobility  Mobility Specialist Start Time (ACUTE ONLY) 1028  Mobility Specialist Stop Time (ACUTE ONLY) 1034  Mobility Specialist Time Calculation (min) (ACUTE ONLY) 6 min   Pt received in bed and agreeable to mobility. No complaints during session. Pt to bed after session with all needs met.    [facility]

## 2023-05-19 NOTE — Progress Notes (Signed)
   05/19/23 1022  TOC Discharge Assessment  Final next level of care Home/Self Care  Once discharged, how will the patient get to their discharge location? Family/Friend - Partnered Transport  Has discharge transport plan been identified? Yes  Barriers to Discharge No Barriers Identified  Patient states their goals for this hospitalization and ongoing recovery are: no stated  Choice offered to / list presented to  NA  DME Agency NA  HH Arranged NA  Patient and family notified of of transfer 05/19/23

## 2023-05-19 NOTE — Discharge Summary (Signed)
Physician Discharge Summary   DONELLA ANZIVINO BMW:[id number] DOB: 1956-05-01 DOA: 05/13/2023  PCP: Corwin Levins, MD  Admit date: 05/13/2023 Discharge date: 05/19/2023   Admitted From: Home Disposition:  Home Discharging physician: Lewie Chamber, MD Barriers to discharge: none  Recommendations at discharge: Continue encouraging smoking cessation   Discharge Condition: stable CODE STATUS: Full Diet recommendation:  Diet Orders (From admission, onward)     Start     Ordered   05/19/23 0000  Diet general        05/19/23 1138   05/14/23 0356  Diet regular Room service appropriate? Yes; Fluid consistency: Thin  Diet effective now       Question Answer Comment  Room service appropriate? Yes   Fluid consistency: Thin      05/14/23 0356            Hospital Course: Ms. Basgall is a 67 yo female with PMH COPD, ongoing tobacco use, hiatal hernia, HLD who presented with worsening dyspnea and cough.  She has productive white sputum at baseline but other symptoms have been going on for approximately 6 weeks she stated.  Home breathing treatments were not working.  She had not been on any recent antibiotics or steroids prior to admission.  She ultimately presented due to no improvement in her symptoms. CT angio chest was performed which was negative for PE.  Bibasilar atelectasis with possible superimposed infiltrate was appreciated.  She was started on Rocephin and azithromycin and admitted for further treatment of pneumonia and COPD exacerbation.  She had good clinical response to antibiotics and steroids.  Antibiotic course completed during hospitalization and she was continued on further prednisone course.  Assessment and Plan: * CAP (community acquired pneumonia)-resolved as of 05/19/2023 - CT angio chest shows infiltrates notably more prominent in the left lower lobe compared to right lower lobe -WBC elevated on admission as well -Completed Rocephin and azithromycin during  hospitalization  COPD exacerbation (HCC) - Likely multifactorial in setting of ongoing tobacco use plus superimposed pneumonia - Treated with IV steroids during hospitalization and continued on prednisone to complete course  Right-sided chest pain - Problem noted already on her problem list but she is complaining of acutely worsened right sided chest wall pain from excessive coughing recently -Trial of lidocaine patch and Robaxin - Short trial of oxycodone for breakthrough pain and start on scheduled Tylenol  Hypomagnesemia - Repleted  Tobacco abuse - Ongoing tobacco use for which she has been strongly encouraged to quit -States she is still smoking approximately 1/2 PPD though suspect she is underreporting  Gastroesophageal reflux disease - Continue Protonix  Anxiety and depression - Continue Celexa and Xanax (verified on database)   The patient's acute and chronic medical conditions were treated accordingly. On day of discharge, patient was felt deemed stable for discharge. Patient/family member advised to call PCP or come back to ER if needed.   Principal Diagnosis: CAP (community acquired pneumonia)  Discharge Diagnoses: Active Hospital Problems   Diagnosis Date Noted   COPD exacerbation (HCC) 11/18/2008    Priority: 2.   Right-sided chest pain 09/05/2021    Priority: 3.   Hypomagnesemia 05/15/2023   Tobacco abuse 12/03/2016   Gastroesophageal reflux disease    Anxiety and depression 06/21/2015    Resolved Hospital Problems   Diagnosis Date Noted Date Resolved   CAP (community acquired pneumonia) 05/14/2023 05/19/2023    Priority: 1.     Discharge Instructions     Diet general   Complete by:  As directed    Increase activity slowly   Complete by: As directed       Allergies as of 05/19/2023       Reactions   Advil [ibuprofen] Nausea And Vomiting   Bee Venom Swelling   Codeine Nausea Only, Other (See Comments)   CAUSES ULCERS   Clonidine Derivatives  Other (See Comments)   Unknown    Doxycycline Other (See Comments)   nausea   Statins Other (See Comments)   unknown   Latex Rash   Levaquin [levofloxacin] Nausea Only   Propoxyphene N-acetaminophen Nausea Only        Medication List     STOP taking these medications    pantoprazole 40 MG tablet Commonly known as: PROTONIX   sulfamethoxazole-trimethoprim 800-160 MG tablet Commonly known as: BACTRIM DS       TAKE these medications    albuterol 108 (90 Base) MCG/ACT inhaler Commonly known as: ProAir HFA Inhale 2 puffs into the lungs every 6 (six) hours as needed for wheezing or shortness of breath.   ALPRAZolam 1 MG tablet Commonly known as: XANAX TAKE 1 TABLET BY MOUTH AT BEDTIME AS NEEDED FOR ANXIETY   citalopram 20 MG tablet Commonly known as: CELEXA Take 1 tablet (20 mg total) by mouth daily.   cyclobenzaprine 5 MG tablet Commonly known as: FLEXERIL Take 1 tablet (5 mg total) by mouth 3 (three) times daily as needed for muscle spasms.   dexlansoprazole 60 MG capsule Commonly known as: Dexilant Take 1 capsule (60 mg total) by mouth daily.   EPINEPHrine 0.3 mg/0.3 mL Soaj injection Commonly known as: EPI-PEN INJECT AS DIRECTED FOR SEVERE ALLERGIC REACTION What changed: See the new instructions.   Erenumab-aooe 140 MG/ML Soaj Inject 140 mg into the skin every 30 (thirty) days.   Fasenra 30 MG/ML prefilled syringe Generic drug: benralizumab INJECT 30MG  SUBCUTANEOUSLY  EVERY 8 WEEKS   gabapentin 600 MG tablet Commonly known as: NEURONTIN Take 600 mg by mouth 2 (two) times daily.   meclizine 12.5 MG tablet Commonly known as: ANTIVERT TAKE 1 TABLET BY MOUTH THREE TIMES DAILY AS NEEDED FOR DIZZINESS   montelukast 10 MG tablet Commonly known as: SINGULAIR Take 1 tablet (10 mg total) by mouth at bedtime. What changed: when to take this   nitroGLYCERIN 0.4 MG SL tablet Commonly known as: NITROSTAT PLACE 1 TABLET UNDER THE TONGUE EVERY 5 MINUTES AS  NEEDED FOR CHEST PAIN   predniSONE 20 MG tablet Commonly known as: DELTASONE Take 2 tablets (40 mg total) by mouth daily with breakfast for 3 days. Start taking on: May 20, 2023 What changed:  medication strength how much to take how to take this when to take this additional instructions   traMADol 50 MG tablet Commonly known as: ULTRAM Take 1 tablet (50 mg total) by mouth every 6 (six) hours as needed.   valACYclovir 1000 MG tablet Commonly known as: VALTREX Take 1 tablet by mouth twice daily        Allergies  Allergen Reactions   Advil [Ibuprofen] Nausea And Vomiting   Bee Venom Swelling   Codeine Nausea Only and Other (See Comments)    CAUSES ULCERS   Clonidine Derivatives Other (See Comments)    Unknown    Doxycycline Other (See Comments)    nausea   Statins Other (See Comments)    unknown   Latex Rash   Levaquin [Levofloxacin] Nausea Only   Propoxyphene N-Acetaminophen Nausea Only    Consultations:   Procedures:  Discharge Exam: BP 131/68 (BP Location: Right Arm)   Pulse 69   Temp 98.2 F (36.8 C) (Oral)   Resp 16   Ht 5\' 4"  (1.626 m)   Wt 53.5 kg   SpO2 93%   BMI 20.25 kg/m  Physical Exam Constitutional:      General: She is not in acute distress.    Appearance: She is well-developed.     Comments: Less anxious and less short of breath today  HENT:     Head: Normocephalic and atraumatic.     Mouth/Throat:     Mouth: Mucous membranes are moist.  Eyes:     Extraocular Movements: Extraocular movements intact.  Cardiovascular:     Rate and Rhythm: Normal rate and regular rhythm.  Pulmonary:     Effort: Pulmonary effort is normal. No respiratory distress.     Breath sounds: No wheezing or rhonchi.     Comments: Normal air movement now Abdominal:     General: Bowel sounds are normal. There is no distension.     Palpations: Abdomen is soft.     Tenderness: There is no abdominal tenderness.  Musculoskeletal:        General: Normal  range of motion.     Cervical back: Normal range of motion and neck supple.  Skin:    General: Skin is warm and dry.  Neurological:     General: No focal deficit present.     Mental Status: She is alert.  Psychiatric:        Mood and Affect: Mood is not anxious.      The results of significant diagnostics from this hospitalization (including imaging, microbiology, ancillary and laboratory) are listed below for reference.   Microbiology: Recent Results (from the past 240 hour(s))  Culture, blood (Routine X 2) w Reflex to ID Panel     Status: None   Collection Time: 05/14/23  4:52 AM   Specimen: BLOOD  Result Value Ref Range Status   Specimen Description   Final    BLOOD BLOOD RIGHT ARM Performed at [facility], [address], [place], [place] [address]    Special Requests   Final    BOTTLES DRAWN AEROBIC AND ANAEROBIC Blood Culture adequate volume Performed at [facility], [address] [place], [place] [address]    Culture   Final    NO GROWTH 5 DAYS Performed at [facility], [address] [place], [place] [address]    Report Status 05/19/2023 FINAL  Final  Culture, blood (Routine X 2) w Reflex to ID Panel     Status: None   Collection Time: 05/14/23  4:52 AM   Specimen: BLOOD  Result Value Ref Range Status   Specimen Description   Final    BLOOD SITE NOT SPECIFIED Performed at [facility], [address], [place], [place] [address]    Special Requests   Final    BOTTLES DRAWN AEROBIC ONLY Blood Culture results may not be optimal due to an inadequate volume of blood received in culture bottles Performed at [facility], [address], [place], [place] [address]    Culture   Final    NO GROWTH 5 DAYS Performed at [facility], [address] [place], [place] [address]    Report Status 05/19/2023 FINAL  Final     Labs: BNP (last 3 results) No results for input(s):  "BNP" in the last 8760 hours. Basic  Metabolic Panel: Recent Labs  Lab 05/13/23 1938 05/14/23 0053 05/15/23 0538 05/16/23 0529  NA 137  --  136 133*  K 3.5  --  4.8 4.5  CL 110  --  105 104  CO2 18*  --  21* 19*  GLUCOSE 146*  --  126* 147*  BUN 10  --  14 17  CREATININE 0.87  --  0.82 0.90  CALCIUM 8.5*  --  8.7* 8.4*  MG  --  1.6*  --  2.1   Liver Function Tests: Recent Labs  Lab 05/14/23 0053  AST 20  ALT 15  ALKPHOS 70  BILITOT 0.5  PROT 7.0  ALBUMIN 4.2   Recent Labs  Lab 05/14/23 0053  LIPASE 32   No results for input(s): "AMMONIA" in the last 168 hours. CBC: Recent Labs  Lab 05/13/23 1938 05/15/23 0538 05/16/23 0529  WBC 6.6 16.7* 12.7*  NEUTROABS  --   --  10.9*  HGB 11.6* 13.2 13.1  HCT 33.4* 39.9 39.2  MCV 98.2 99.0 99.2  PLT 186 265 236   Cardiac Enzymes: No results for input(s): "CKTOTAL", "CKMB", "CKMBINDEX", "TROPONINI" in the last 168 hours. BNP: Invalid input(s): "POCBNP" CBG: No results for input(s): "GLUCAP" in the last 168 hours. D-Dimer No results for input(s): "DDIMER" in the last 72 hours. Hgb A1c No results for input(s): "HGBA1C" in the last 72 hours. Lipid Profile No results for input(s): "CHOL", "HDL", "LDLCALC", "TRIG", "CHOLHDL", "LDLDIRECT" in the last 72 hours. Thyroid function studies No results for input(s): "TSH", "T4TOTAL", "T3FREE", "THYROIDAB" in the last 72 hours.  Invalid input(s): "FREET3" Anemia work up No results for input(s): "VITAMINB12", "FOLATE", "FERRITIN", "TIBC", "IRON", "RETICCTPCT" in the last 72 hours. Urinalysis    Component Value Date/Time   COLORURINE YELLOW 01/03/2023 0811   APPEARANCEUR HAZY (A) 01/03/2023 0811   LABSPEC 1.008 01/03/2023 0811   PHURINE 5.0 01/03/2023 0811   GLUCOSEU NEGATIVE 01/03/2023 0811   GLUCOSEU NEGATIVE 01/01/2023 0953   HGBUR NEGATIVE 01/03/2023 0811   BILIRUBINUR NEGATIVE 01/03/2023 0811   BILIRUBINUR negative 06/21/2016 1025   KETONESUR NEGATIVE 01/03/2023  0811   PROTEINUR NEGATIVE 01/03/2023 0811   UROBILINOGEN 0.2 01/01/2023 0953   NITRITE NEGATIVE 01/03/2023 0811   LEUKOCYTESUR TRACE (A) 01/03/2023 0811   Sepsis Labs Recent Labs  Lab 05/13/23 1938 05/15/23 0538 05/16/23 0529  WBC 6.6 16.7* 12.7*   Microbiology Recent Results (from the past 240 hour(s))  Culture, blood (Routine X 2) w Reflex to ID Panel     Status: None   Collection Time: 05/14/23  4:52 AM   Specimen: BLOOD  Result Value Ref Range Status   Specimen Description   Final    BLOOD BLOOD RIGHT ARM Performed at [facility], [address], [place], [place] [address]    Special Requests   Final    BOTTLES DRAWN AEROBIC AND ANAEROBIC Blood Culture adequate volume Performed at [facility], [address] [place], [place] [address]    Culture   Final    NO GROWTH 5 DAYS Performed at [facility], [address] [place], [place] [address]    Report Status 05/19/2023 FINAL  Final  Culture, blood (Routine X 2) w Reflex to ID Panel     Status: None   Collection Time: 05/14/23  4:52 AM   Specimen: BLOOD  Result Value Ref Range Status   Specimen Description   Final    BLOOD SITE NOT SPECIFIED Performed at  [facility], [address] [place], [place] [address]    Special Requests   Final    BOTTLES DRAWN AEROBIC ONLY Blood Culture results may not be optimal due to an inadequate volume of blood received in culture bottles Performed at [facility], [address], [place], [place] [address]    Culture   Final    NO GROWTH 5 DAYS Performed at [facility], [address], [place], [place] [address]    Report Status 05/19/2023 FINAL  Final    Procedures/Studies: CT Angio Chest PE W and/or Wo Contrast  Result Date: 05/14/2023 CLINICAL DATA:  Chest pain and shortness of breath. Concern for pulmonary embolism. EXAM: CT ANGIOGRAPHY CHEST WITH CONTRAST TECHNIQUE: Multidetector  CT imaging of the chest was performed using the standard protocol during bolus administration of intravenous contrast. Multiplanar CT image reconstructions and MIPs were obtained to evaluate the vascular anatomy. RADIATION DOSE REDUCTION: This exam was performed according to the departmental dose-optimization program which includes automated exposure control, adjustment of the mA and/or kV according to patient size and/or use of iterative reconstruction technique. CONTRAST:  75mL OMNIPAQUE IOHEXOL 350 MG/ML SOLN COMPARISON:  Chest radiograph dated 05/13/2023 and CT dated 02/16/2017. FINDINGS: Cardiovascular: There is no cardiomegaly or pericardial effusion. Mild atherosclerotic calcification of the thoracic aorta. No aneurysmal dilatation or dissection. The origins of the great vessels of the aortic arch appear patent. No pulmonary artery embolus identified. Mediastinum/Nodes: No hilar or mediastinal adenopathy. The esophagus is grossly unremarkable. No mediastinal fluid collection. Lungs/Pleura: Background of emphysema. There are bibasilar subpleural consolidation which may represent atelectasis. Pneumonia is not excluded. There is no pleural effusion or pneumothorax. The central airways are patent. Upper Abdomen: Cholecystectomy. Musculoskeletal: No acute osseous pathology. Review of the MIP images confirms the above findings. IMPRESSION: 1. No CT evidence of pulmonary embolism. 2. Bibasilar subpleural atelectasis.  Pneumonia is not excluded. 3. Aortic Atherosclerosis (ICD10-I70.0) and Emphysema (ICD10-J43.9). Electronically Signed   By: Elgie Collard M.D.   On: 05/14/2023 02:19   DG Chest 2 View  Result Date: 05/13/2023 CLINICAL DATA:  Shortness of breath, chest pain, cough. EXAM: CHEST - 2 VIEW COMPARISON:  Chest radiograph dated 04/30/2023. FINDINGS: There is blunting of the left costophrenic angle, likely a small pleural effusion. There are bibasilar atelectasis/scarring. Pneumonia is not excluded.  There is no pneumothorax. Stable cardiac silhouette. Atherosclerotic calcification of the aorta. No acute osseous pathology. IMPRESSION: Small left pleural effusion and bibasilar atelectasis or infiltrate, new since the prior radiograph. Electronically Signed   By: Elgie Collard M.D.   On: 05/13/2023 19:16   DG Lumbar Spine Complete  Result Date: 05/08/2023 CLINICAL DATA:  Low back pain.  Persistent pain for 2 weeks. EXAM: LUMBAR SPINE - COMPLETE 4+ VIEW COMPARISON:  Most recent radiograph and CT 09/08/2022 FINDINGS: There are 5 non-rib-bearing lumbar vertebra. Stable lumbar alignment. Normal vertebral body heights. No evidence of acute fracture. Trace anterior spurring at multiple levels with preservation of disc spaces. Mild lower lumbar facet hypertrophy. No evidence of focal bone lesion or bone destruction. Sacroiliac joints are congruent. IMPRESSION: Mild lower lumbar facet hypertrophy. Minimal spondylosis. Electronically Signed   By: Narda Rutherford M.D.   On: 05/08/2023 14:23   DG Cervical Spine Complete  Result Date: 05/08/2023 CLINICAL DATA:  Persistent pain for 2 weeks. EXAM: CERVICAL SPINE - COMPLETE 4+ VIEW COMPARISON:  Cervical radiograph 04/11/2021 FINDINGS: Anterior fusion hardware from C3 through C5, unchanged. The hardware is intact. Fusion appears  solid. There is also fusion without hardware at C5-C6 and C6-C7. No evidence of fracture, bony destructive change or focal bone abnormality. Facet hypertrophy at C2-C3 and C3-C4 and C5-C6. Suggestion of left bony neural foraminal stenosis at C5-C6 on the left due to facet disease. No prevertebral soft tissue thickening. The bones are subjectively under mineralized. IMPRESSION: 1. Unchanged appearance of cervical fusion. 2. Facet hypertrophy at C2-C3 and C3-C4 and C5-C6. Suggestion of left bony neural foraminal stenosis at C5-C6 due to facet disease. Electronically Signed   By: Narda Rutherford M.D.   On: 05/08/2023 14:22   DG Chest 2  View  Result Date: 05/08/2023 CLINICAL DATA:  COPD exacerbation. Improving than worsening cough and wheezing. EXAM: CHEST - 2 VIEW COMPARISON:  Radiograph 04/09/2023 FINDINGS: Stable left lung volume loss. Unchanged hyperinflation. Again seen emphysema and bronchial thickening. Chronic coarsening of pulmonary parenchyma. The previous right lung interstitial opacities have resolved. Normal heart size with stable mediastinal contours. No pulmonary edema, pleural effusion or pneumothorax. No acute osseous findings. IMPRESSION: 1. Resolution of previous right lung opacities. 2. Chronic hyperinflation, emphysema and bronchial thickening. No new airspace disease. Electronically Signed   By: Narda Rutherford M.D.   On: 05/08/2023 14:18     Time coordinating discharge: Over 30 minutes    Lewie Chamber, MD  [facility] 05/19/2023, 4:44 PM

## 2023-05-21 ENCOUNTER — Encounter: Payer: Self-pay | Admitting: *Deleted

## 2023-05-21 ENCOUNTER — Telehealth: Payer: Self-pay | Admitting: *Deleted

## 2023-05-22 IMAGING — CT CT HEAD W/O CM
3 series · 15 of 47 positions shown, 18 images · non-contrast
Comparison: Brain MRI 10/21/2018.  Head CT 04/07/2015.

CLINICAL DATA: 64-year-old female with dizziness. Increasing
weakness.

EXAM:
CT HEAD WITHOUT CONTRAST
TECHNIQUE: Contiguous axial images were obtained from the base of the skull
through the vertex without intravenous contrast.

[Series 2: head wo · axial · 0.47mm/px · z∈[-118,+7]mm · 9 of 30 slices shown, 12 images]
[im 3/30  brain]
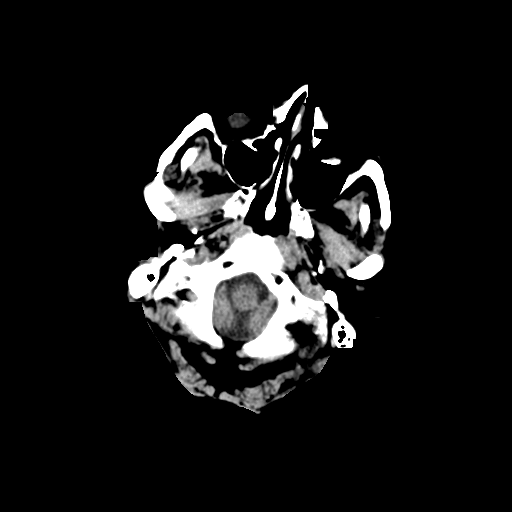
[im 3/30  bone]
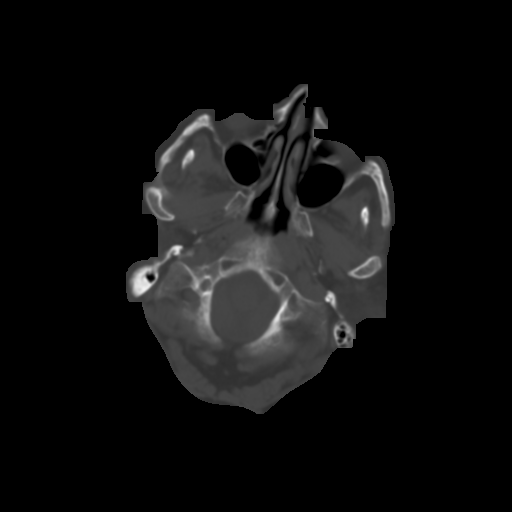
[im 6/30  brain]
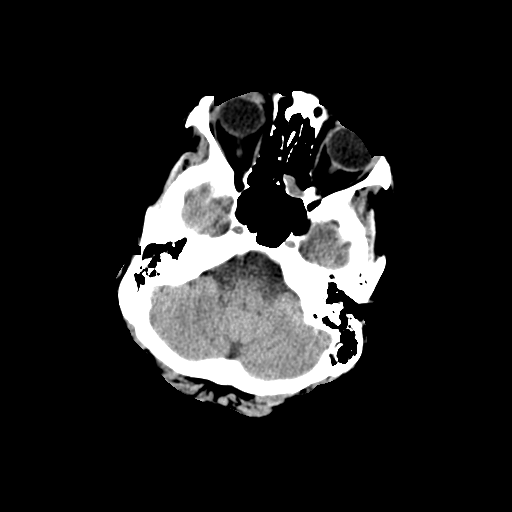
[im 9/30  brain]
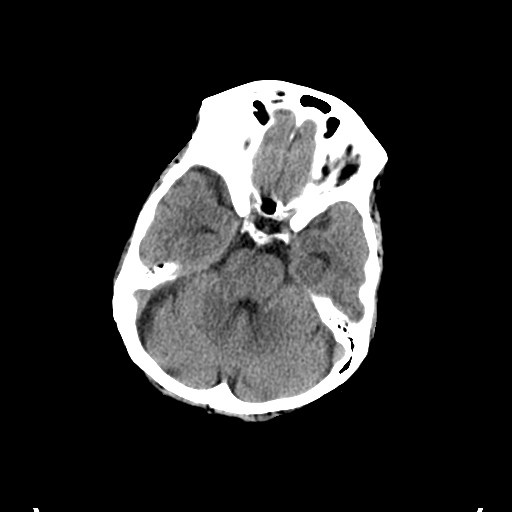
[im 12/30  brain]
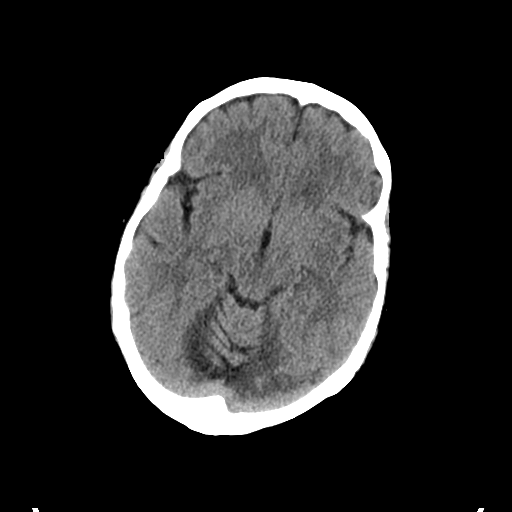
[im 16/30  brain]
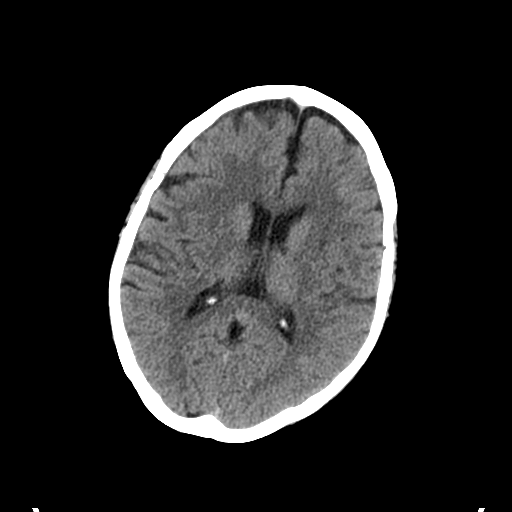
[im 16/30  bone]
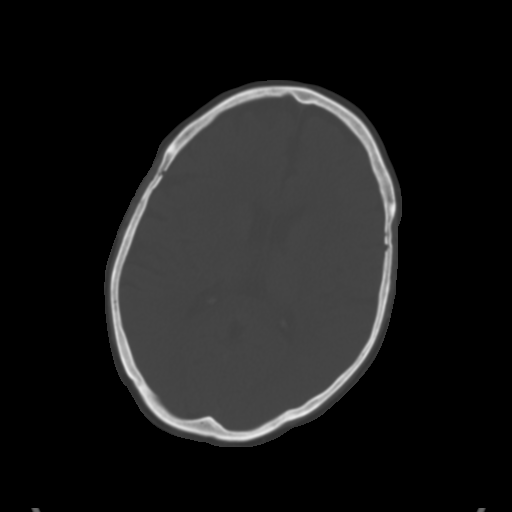
[im 19/30  brain]
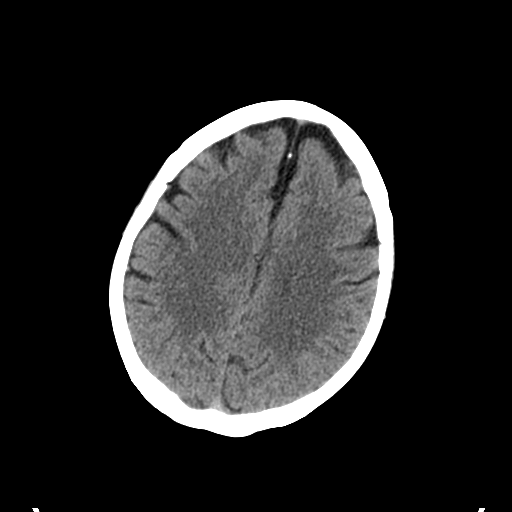
[im 22/30  brain]
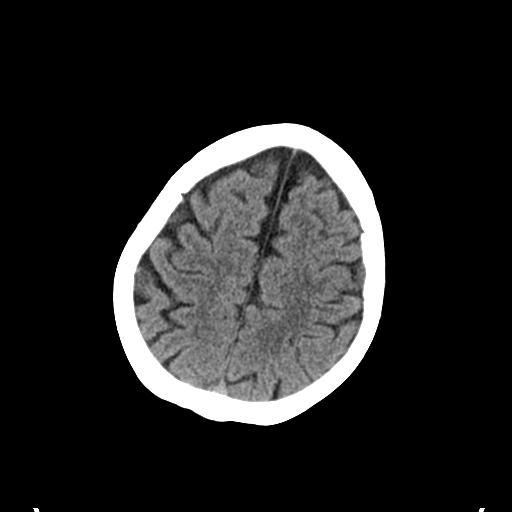
[im 25/30  brain]
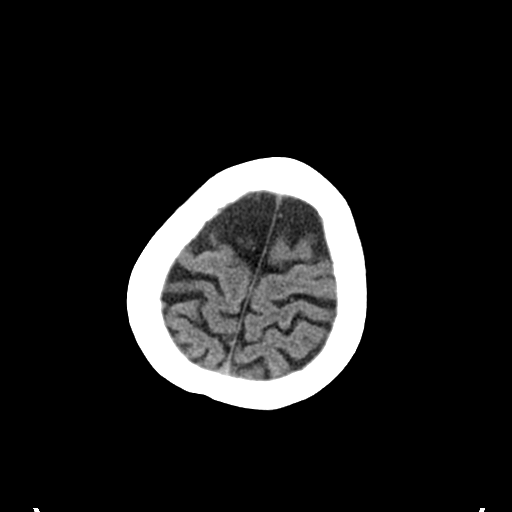
[im 28/30  brain]
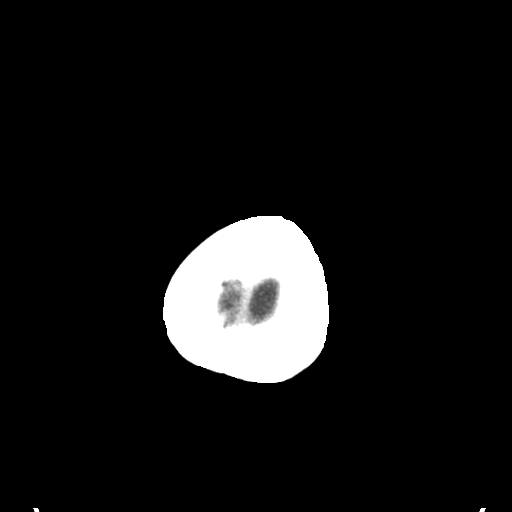
[im 28/30  bone]
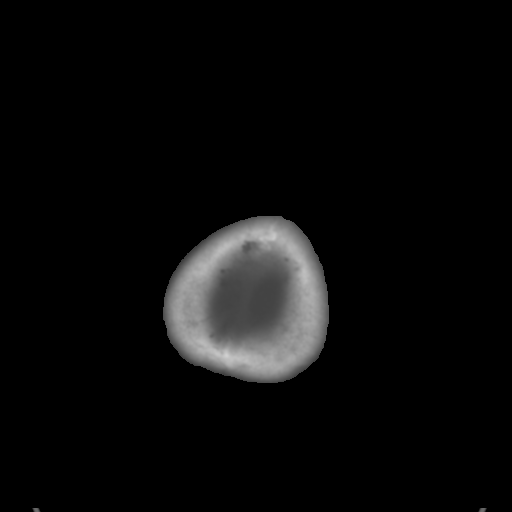

[Series 4: coronal soft tissue · coronal · 0.27mm/px · 3 of 58 slices shown]
[im 20/58  brain]
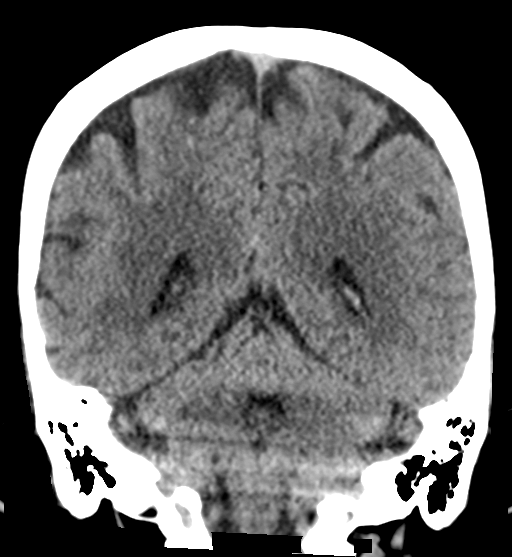
[im 26/58  brain]
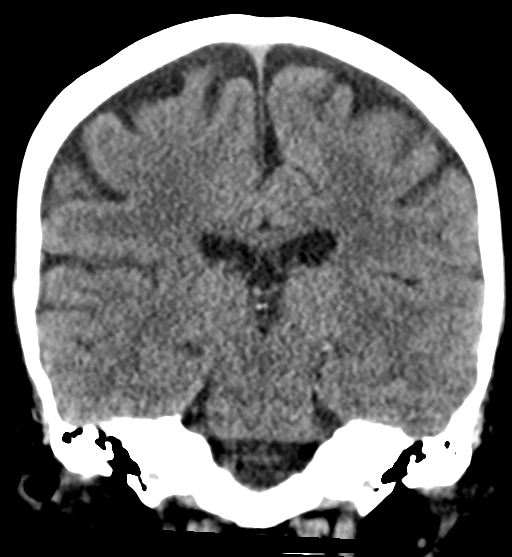
[im 32/58  brain]
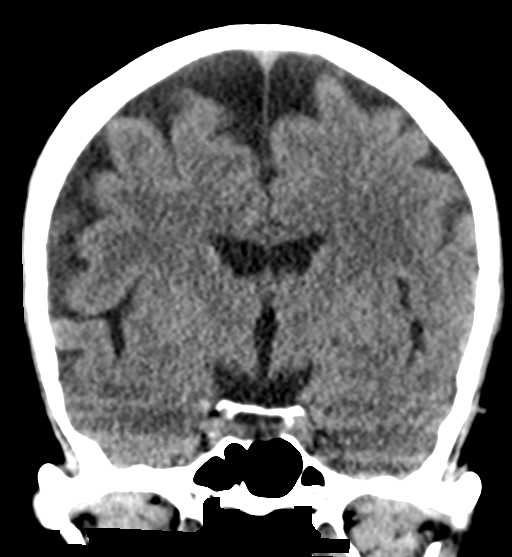

[Series 5: sagittal soft tissue · sagittal · 0.29mm/px · 3 of 45 slices shown]
[im 15/45  brain]
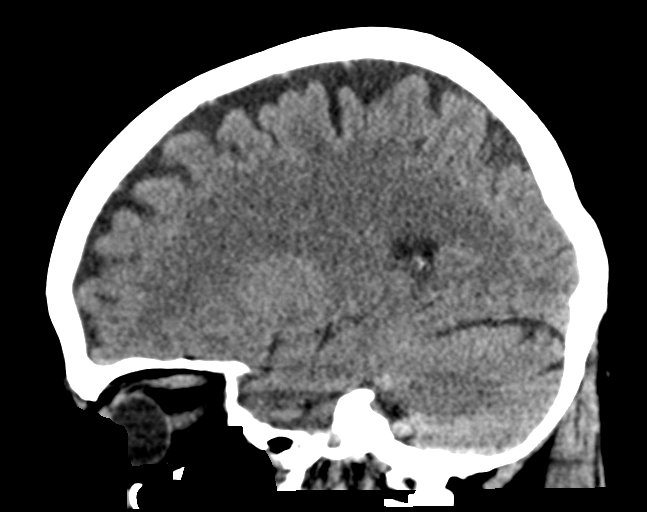
[im 23/45  brain]
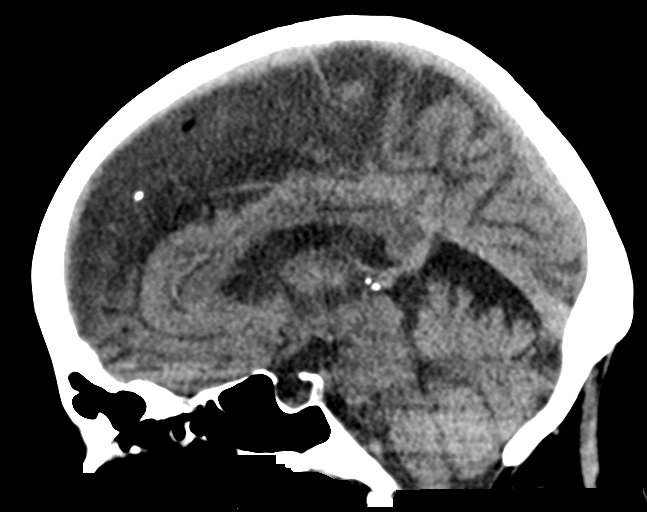
[im 30/45  brain]
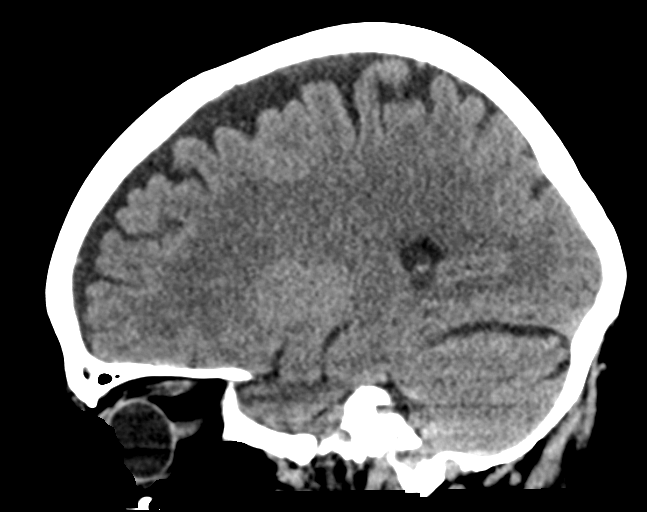

[15 of 47 positions shown; findings below may reference images not displayed]

FINDINGS: Brain: Mild generalized cerebral volume loss since 6097. No midline
shift, ventriculomegaly, mass effect, evidence of mass lesion,
intracranial hemorrhage or evidence of cortically based acute
infarction. No cortical encephalomalacia identified. Minimal to mild
for age scattered white matter hypodensity, mostly subcortical.

Vascular: No suspicious intracranial vascular hyperdensity.

Skull: Stable, negative.

Sinuses/Orbits: Opacified posterior left ethmoid air cell is new
since 6097, and there was mild mucosal thickening there in 4848.
Other paranasal sinuses and mastoids are clear. Tympanic cavities
are clear.

Other: Postoperative changes to both globes. No acute orbit or scalp
soft tissue finding.
IMPRESSION: 1. No acute intracranial abnormality. Mild for age nonspecific
cerebral white matter changes, most commonly due to chronic small
vessel disease.
2. Isolated posterior left ethmoid sinus disease.

## 2023-05-22 IMAGING — US US RENAL
1 series · 14 of 25 positions shown · non-contrast
Comparison: CT 11/14/2017

CLINICAL DATA: Acute kidney injury

EXAM:
RENAL / URINARY TRACT ULTRASOUND COMPLETE

[Series 1: us renal · 14 of 31 slices shown]
[im 1/31]
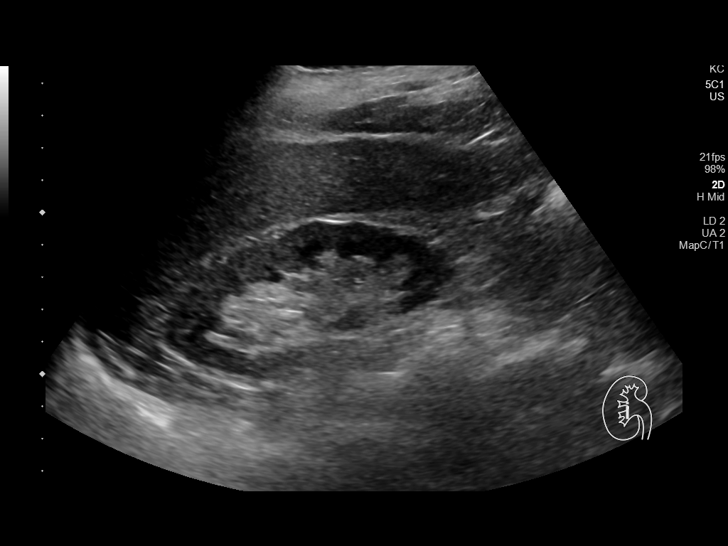
[im 3/31]
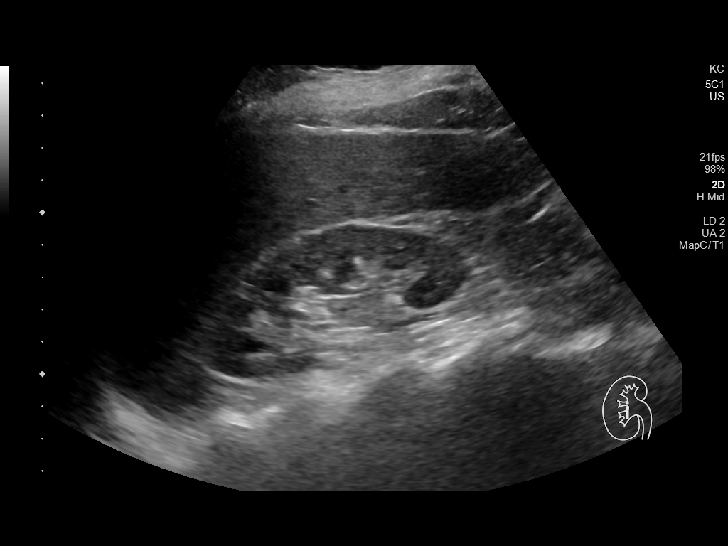
[im 6/31]
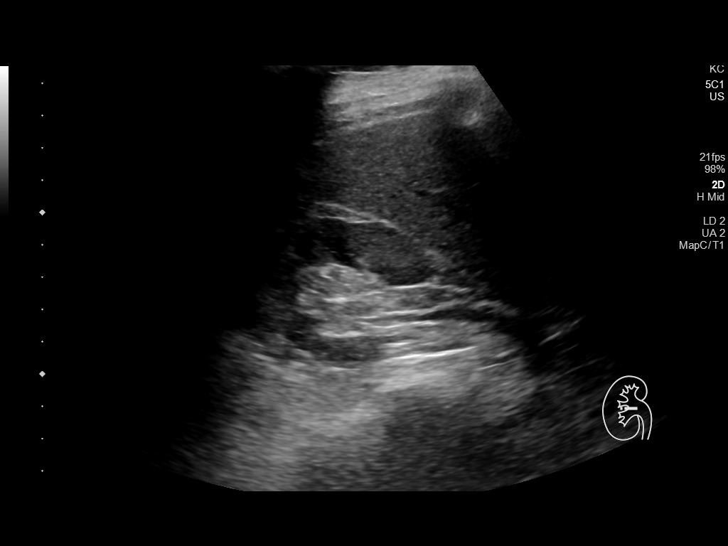
[im 8/31]
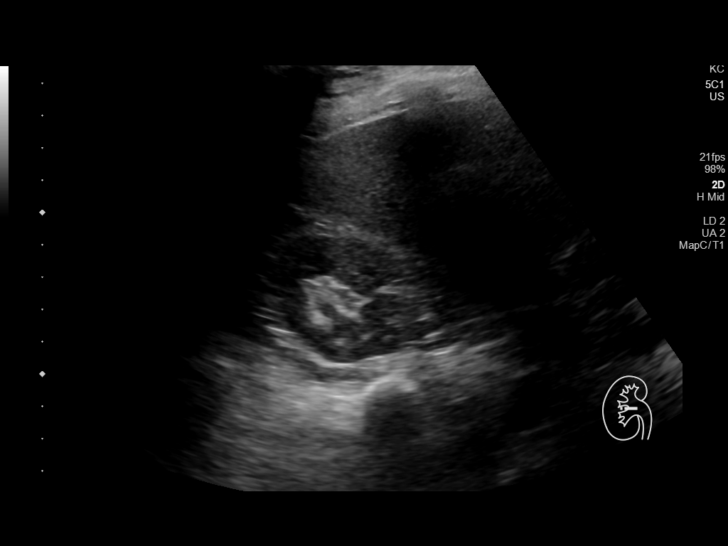
[im 11/31]
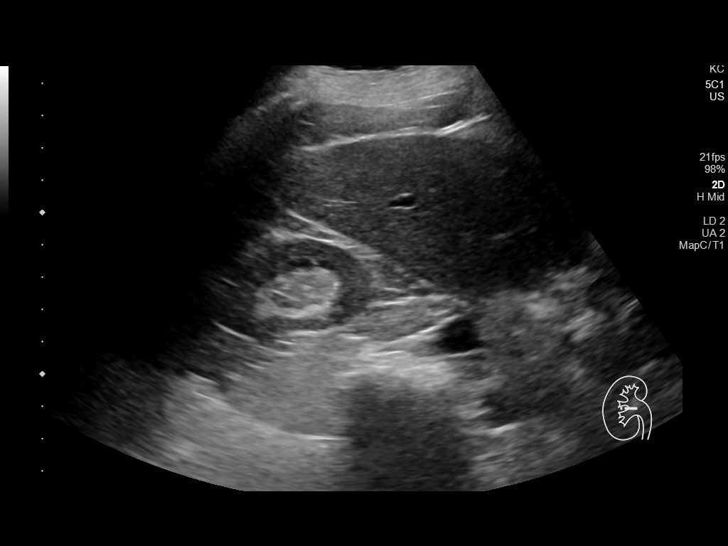
[im 12/31]
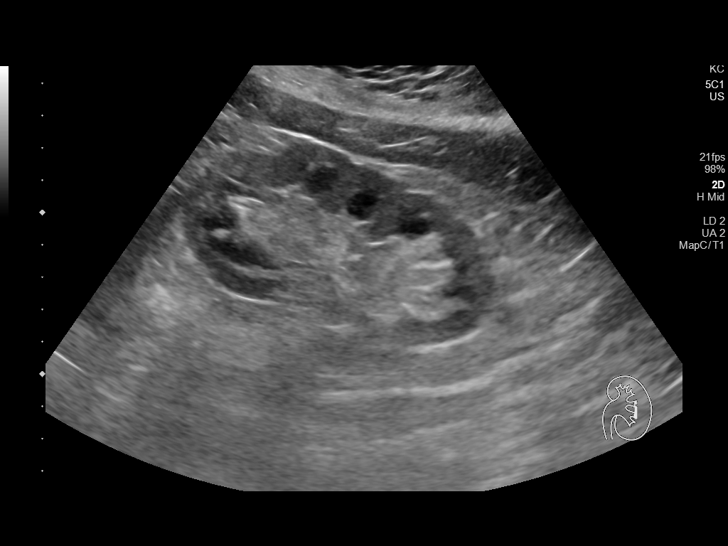
[im 14/31]
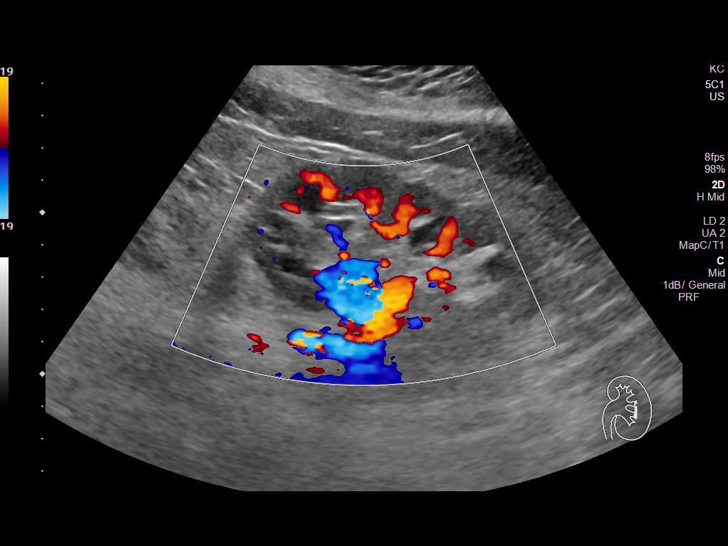
[im 17/31]
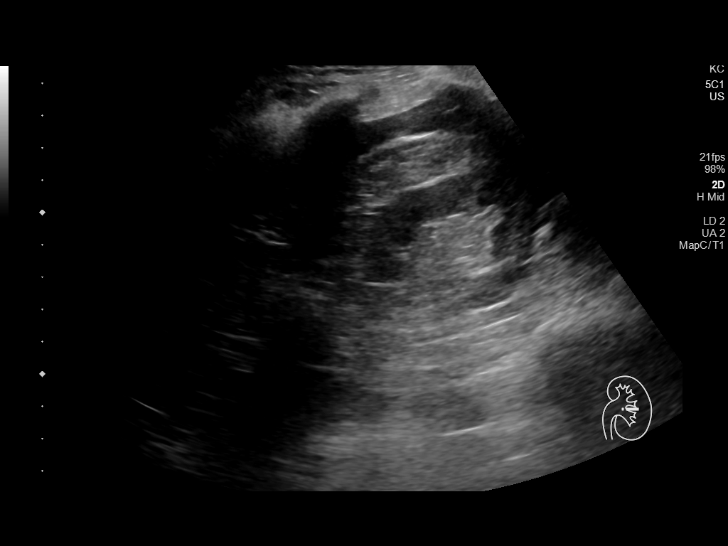
[im 19/31]
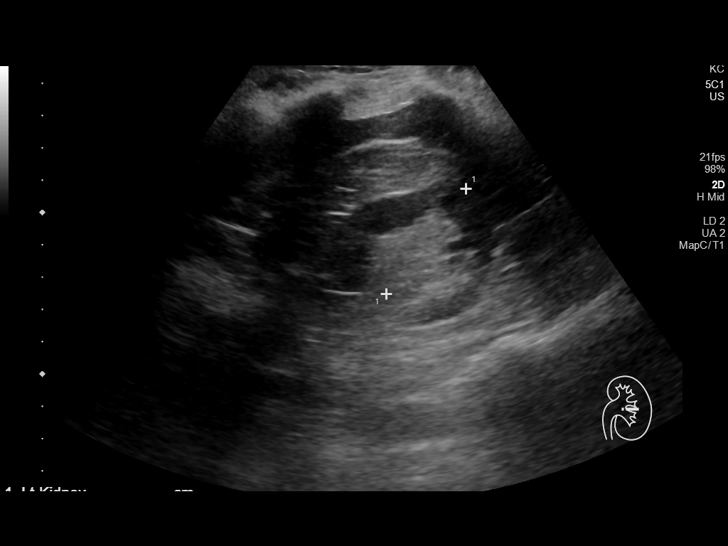
[im 21/31]
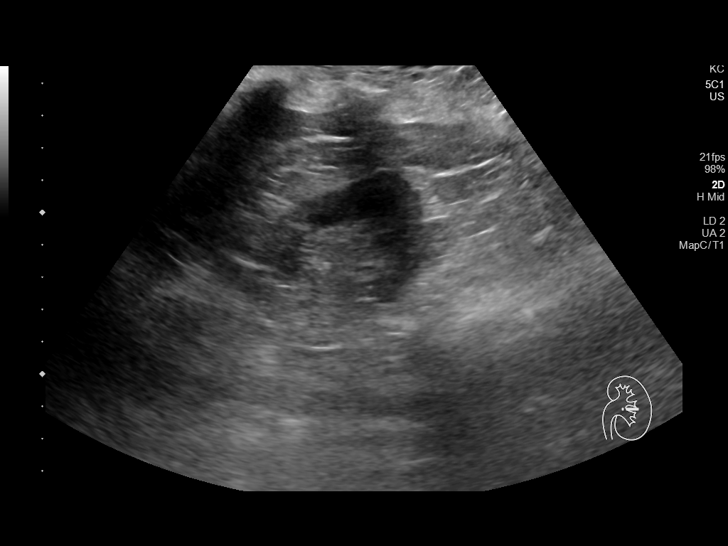
[im 23/31]
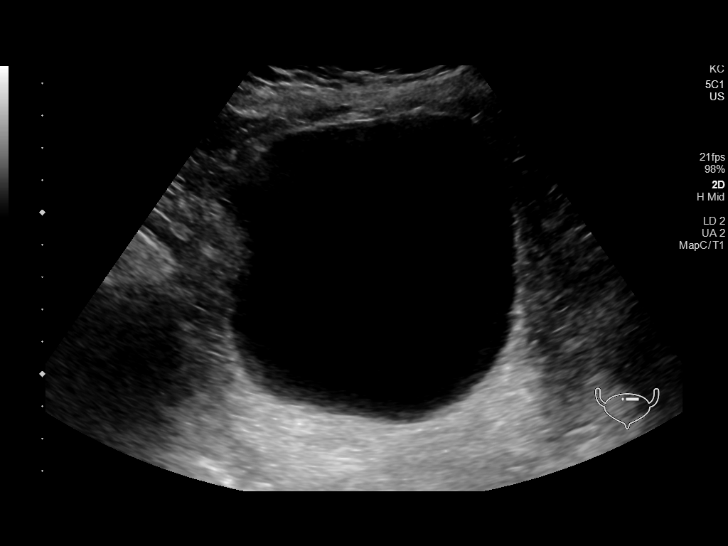
[im 26/31]
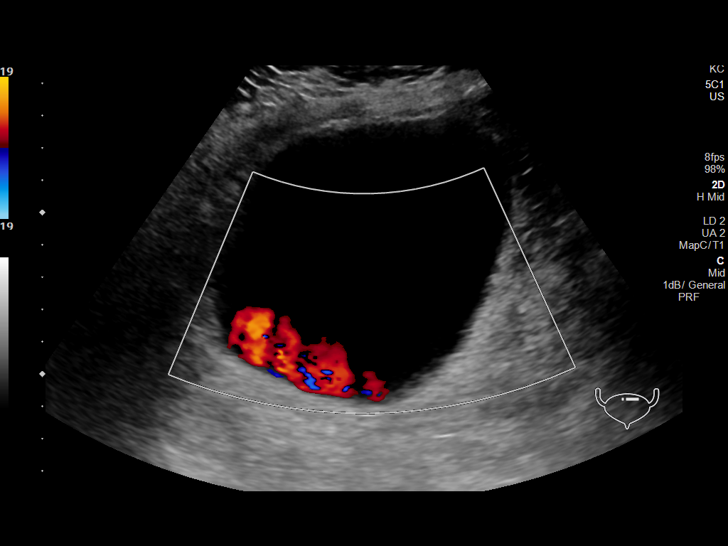
[im 28/31]
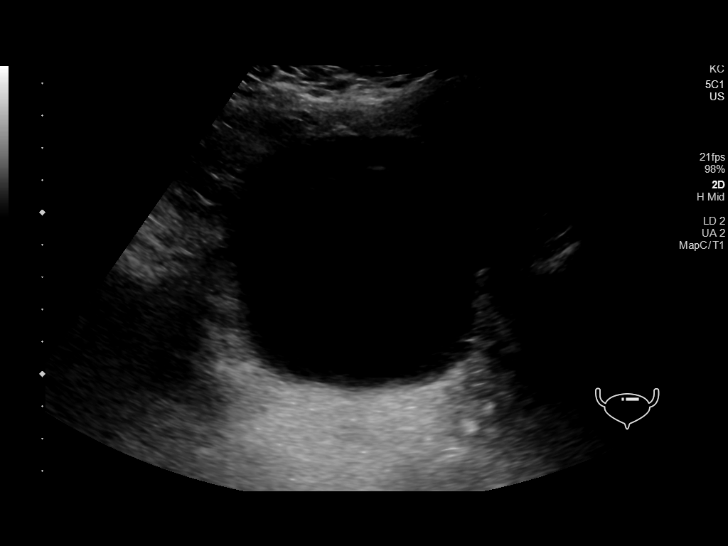
[im 31/31]
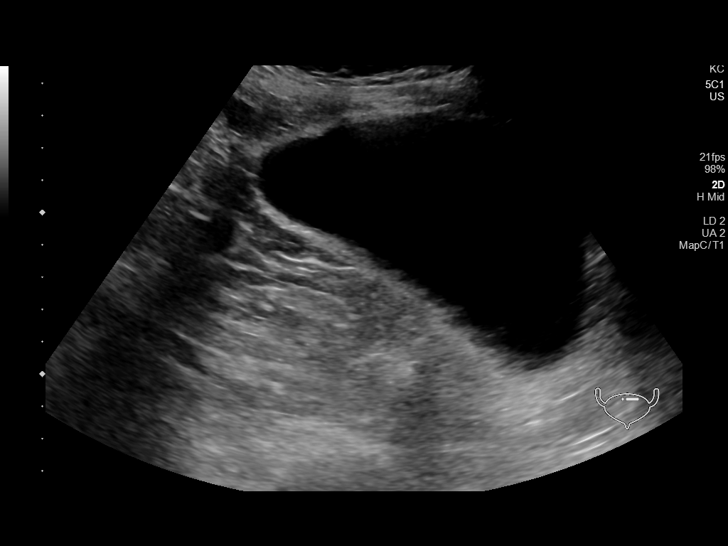

[14 of 25 positions shown; findings below may reference images not displayed]

FINDINGS: Right Kidney:

Renal measurements: 9.4 x 3.7 x 4.7 cm = volume: 85 mL. Mild
cortical volume loss. Echogenicity within normal limits. No mass or
hydronephrosis visualized.

Left Kidney:

Renal measurements: 10.1 x 4.8 x 4.1 cm = volume: 104 mL. Mild
cortical volume loss. Echogenicity within normal limits. No mass or
hydronephrosis visualized.

Bladder:

Appears normal for degree of bladder distention.

Other:

None.
IMPRESSION: No acute finding. No hydronephrosis or focal lesion. Mild bilateral
cortical volume loss.

## 2023-05-22 IMAGING — DX DG CHEST 1V PORT
1 series · 1 of 1 positions shown · non-contrast
Comparison: Chest x-ray 01/12/2021.

CLINICAL DATA: 64-year-old female with history of chest pain and
shortness of breath for the past 3 weeks. History of COPD.

EXAM:
PORTABLE CHEST 1 VIEW

[chest ap]
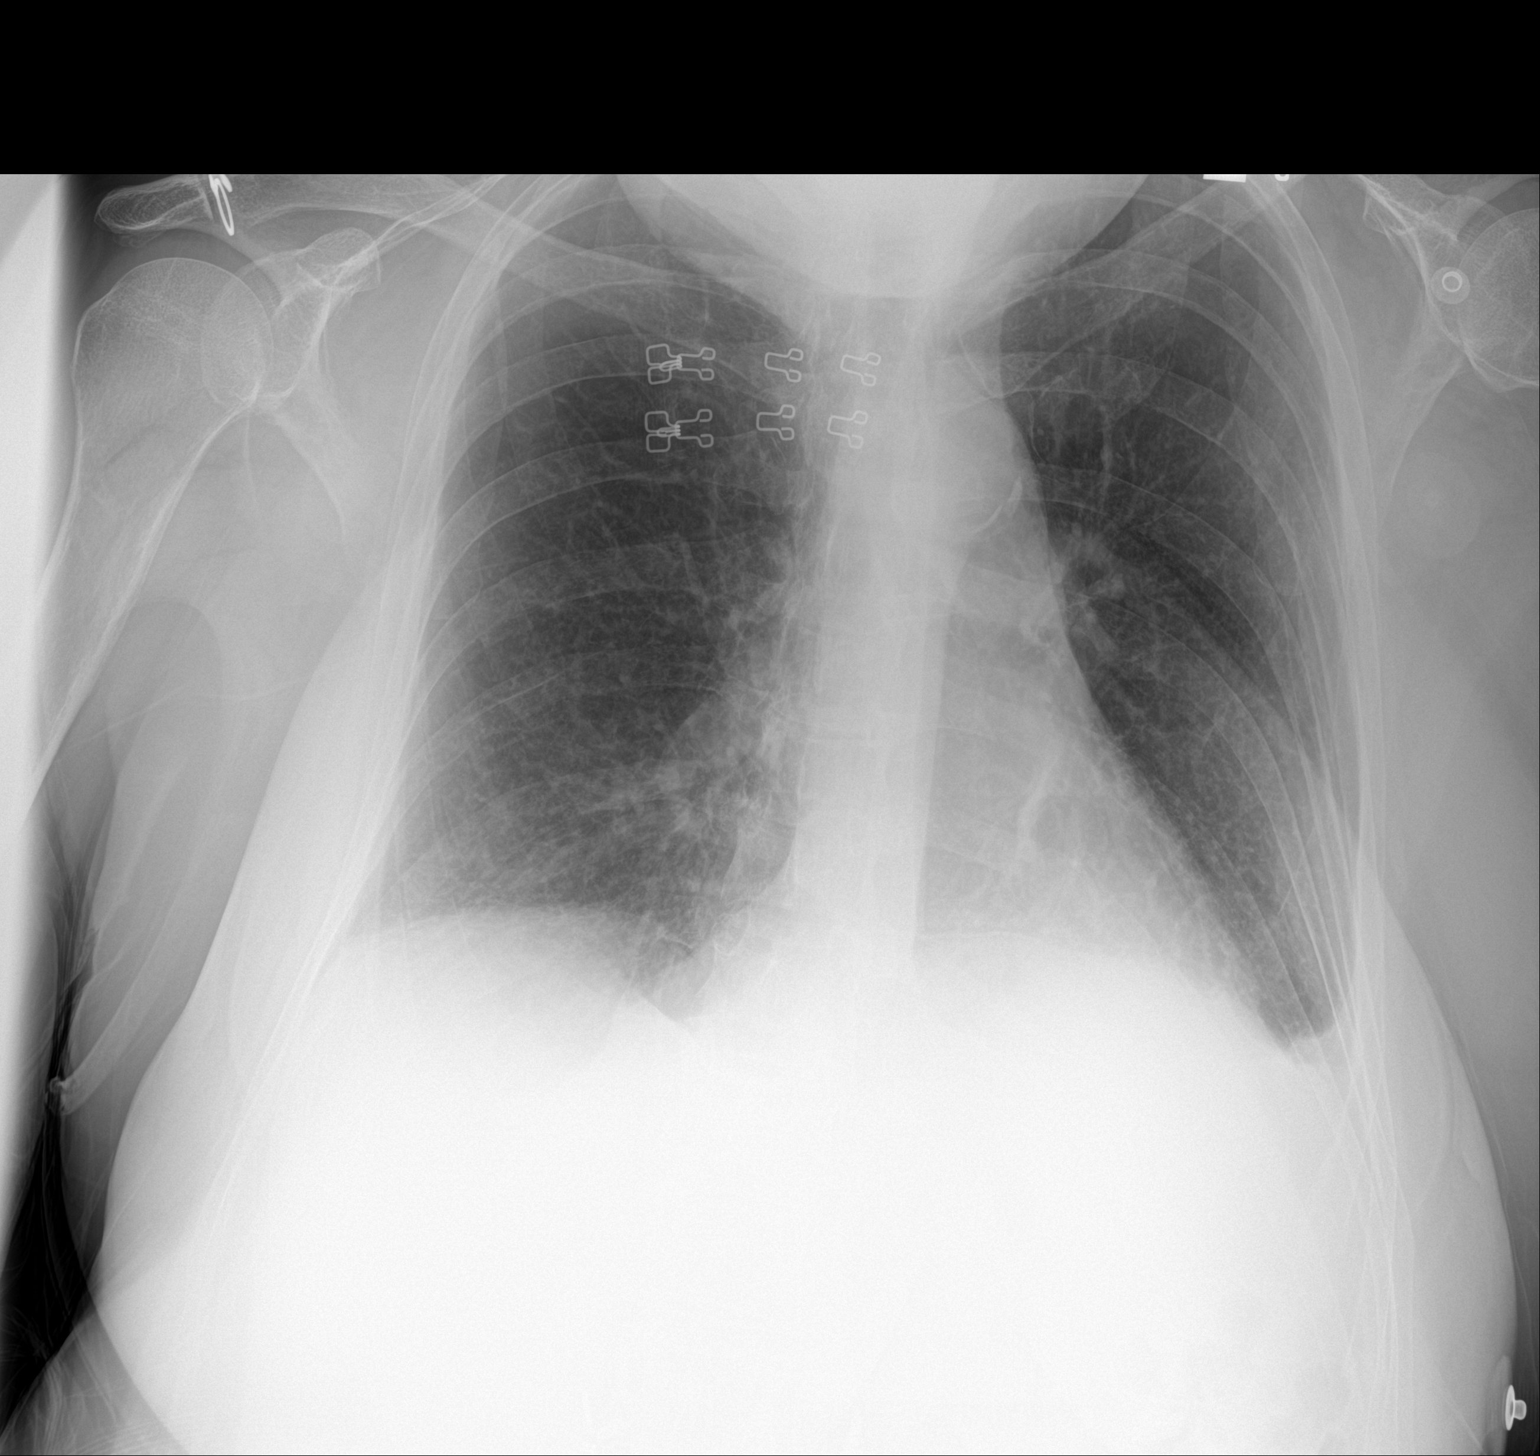

[1 of 1 positions shown; findings below may reference images not displayed]

FINDINGS: Lung volumes are slightly low. No consolidative airspace disease.
Small left pleural effusion. No right pleural effusion. No
pneumothorax. No suspicious appearing pulmonary nodules or masses
are noted. No evidence of pulmonary edema. Heart size is normal.
Atherosclerotic calcifications in the thoracic aorta.
IMPRESSION: 1. Small left pleural effusion.
2. Aortic atherosclerosis.

## 2023-05-22 NOTE — Transitions of Care (Post Inpatient/ED Visit) (Signed)
05/22/2023  Name: YESENYA LAPPIN MRN: [id number] DOB: 1955-10-30  Today's TOC FU Call Status: Today's TOC FU Call Status:: Successful TOC FU Call Completed TOC FU Call Complete Date: 05/21/23 Patient's Name and Date of Birth confirmed.  Transition Care Management Follow-up Telephone Call Date of Discharge: 05/19/23 Discharge Facility: [facility]) Type of Discharge: Inpatient Admission Primary Inpatient Discharge Diagnosis:: CAP in setting of COPD How have you been since you were released from the hospital?: Better ("I think I am better, but my ribs are still so sore from all the coughing.  I could not pick up the prednisone because they didn't send it to my pharmacy-- I called [facility] and they did not have a prescription.") Any questions or concerns?: Yes Patient Questions/Concerns:: Prednisone called in to [facility], instead of [facility], at time of discharge Patient Questions/Concerns Addressed: Other: (thoroughly reviewed AVS instructions with patient, explained with much effort that outpatient pharmacy the medication was called in to is very near the [facility]: she is able to read address/ phone number and will call to confirm if they have medication)  Items Reviewed: Did you receive and understand the discharge instructions provided?: Yes (thoroughly reviewed with patient who verbalizes fair understanding of same: patient reports "don't read that great;" required extra time/ effort) Medications obtained,verified, and reconciled?: Yes (Medications Reviewed) (Full medication reconciliation/ review completed; self-manages medications; encouraged patient to discuss all medications with NP at time of HFU with NP on 05/29/23- she is agreeable; scheduled with RN CM for follow up outreach post-PCP appt) Any new allergies since your discharge?: No Dietary orders reviewed?: Yes Type of Diet Ordered:: "As Healthy as I can" Do you have support at home?: Yes People in Home: alone Name of  Support/Comfort Primary Source: Reports independent in self-care activities; supportive "friend" assists as/ if needed/ indicated  Medications Reviewed Today: Medications Reviewed Today     Reviewed by Michaela Corner, RN (Registered Nurse) on 05/22/23 at [phone]  Med List Status: <None>   Medication Order Taking? Sig Documenting Provider Last Dose Status Informant  albuterol (PROAIR HFA) 108 (90 Base) MCG/ACT inhaler [id number] Yes Inhale 2 puffs into the lungs every 6 (six) hours as needed for wheezing or shortness of breath. Corwin Levins, MD Taking Active Self, Pharmacy Records  ALPRAZolam Prudy Feeler) 1 MG tablet [id number] Yes TAKE 1 TABLET BY MOUTH AT BEDTIME AS NEEDED FOR ANXIETY Corwin Levins, MD Taking Active Self, Pharmacy Records  Benralizumab [facility]) 30 MG/ML SOSY [id number] Yes INJECT 30MG  SUBCUTANEOUSLY  EVERY 8 WEEKS Alfonse Spruce, MD Taking Active Self, Pharmacy Records  Benralizumab SOSY 30 mg [id number]   Alfonse Spruce, MD  Active   citalopram (CELEXA) 20 MG tablet [id number] No Take 1 tablet (20 mg total) by mouth daily.  Patient not taking: Reported on 05/21/2023   Corwin Levins, MD Not Taking Active Self, Pharmacy Records           Med Note Otelia Limes May 21, 2023  3:28 PM) 05/21/23- Reports during [facility] call she is "not sure" if she has this medication  cyclobenzaprine (FLEXERIL) 5 MG tablet [id number] Yes Take 1 tablet (5 mg total) by mouth 3 (three) times daily as needed for muscle spasms. Corwin Levins, MD Taking Active Self, Pharmacy Records  dexlansoprazole [facility]) 60 MG capsule [id number] Yes Take 1 capsule (60 mg total) by mouth daily. Corwin Levins, MD Taking Active Self, Pharmacy Records  EPINEPHrine 0.3 mg/0.3 mL IJ  SOAJ injection [id number] Yes INJECT AS DIRECTED FOR SEVERE ALLERGIC REACTION  Patient taking differently: Inject 0.3 mg into the muscle as needed for anaphylaxis (See Instructions). INJECT AS DIRECTED FOR SEVERE ALLERGIC REACTION    Alfonse Spruce, MD Taking Active Self, Pharmacy Records  Erenumab-aooe 140 MG/ML Ivory Broad [id number] Yes Inject 140 mg into the skin every 30 (thirty) days. [provider] Taking Active Self, Pharmacy Records           Med Note Deloria Lair, DUROJAHYE' R   Wed May 14, 2023  4:24 AM) LF 04/29/23, SOLD TO PT 04/30/23  gabapentin (NEURONTIN) 600 MG tablet [id number] Yes Take 600 mg by mouth 2 (two) times daily. [provider] Taking Active Self, Pharmacy Records  meclizine (ANTIVERT) 12.5 MG tablet [id number] Yes TAKE 1 TABLET BY MOUTH THREE TIMES DAILY AS NEEDED FOR DIZZINESS Corwin Levins, MD Taking Active Self, Pharmacy Records  montelukast (SINGULAIR) 10 MG tablet [id number] No Take 1 tablet (10 mg total) by mouth at bedtime.  Patient not taking: Reported on 05/21/2023   Corwin Levins, MD Not Taking Active Self, Pharmacy Records  nitroGLYCERIN (NITROSTAT) 0.4 MG SL tablet [id number] Yes PLACE 1 TABLET UNDER THE TONGUE EVERY 5 MINUTES AS NEEDED FOR CHEST PAIN [name], MD Taking Active Self, Pharmacy Records  predniSONE (DELTASONE) 20 MG tablet [id number] No Take 2 tablets (40 mg total) by mouth daily with breakfast for 3 days.  Patient not taking: Reported on 05/21/2023   Lewie Chamber, MD Not Taking Active            Med Note Jonnie Kind May 22, 2023  8:42 AM) 05/21/23: Reports during [facility] call has not yet picked up from outpatient pharmacy: explained that Rx was sent to alternate pharmacy, very near her regular pharmacy- ensured that patient will call today to ensure Rx is at alternate pharmacy and will pick up/ start taking promptly  traMADol (ULTRAM) 50 MG tablet [id number] No Take 1 tablet (50 mg total) by mouth every 6 (six) hours as needed.  Patient not taking: Reported on 05/21/2023   Corwin Levins, MD Not Taking Active Self, Pharmacy Records           Med Note Jonnie Kind May 22, 2023  8:43 AM) 05/21/23: reports during [facility] call she is out of this medication-  patient states she would like a refill/ new Rx "to help with my rib pain from all my coughing"  valACYclovir (VALTREX) 1000 MG tablet [id number] Yes Take 1 tablet by mouth twice daily Corwin Levins, MD Taking Active Self, Pharmacy Records           Home Care and Equipment/Supplies: Were Home Health Services Ordered?: No Any new equipment or medical supplies ordered?: No  Functional Questionnaire: Do you need assistance with bathing/showering or dressing?: No Do you need assistance with meal preparation?: No Do you need assistance with eating?: No Do you have difficulty maintaining continence: No Do you need assistance with getting out of bed/getting out of a chair/moving?: No Do you have difficulty managing or taking your medications?: No (Reports self-manages medications; verbalizes fair understanding of medications; could potentially benefit from pharmacy referral- RN CM made aware)  Follow up appointments reviewed: PCP Follow-up appointment confirmed?: Yes (care coordination outreach in real-time with scheduling care guide to successfully schedule hospital follow up PCP appointment 05/29/23) Date of PCP follow-up appointment?: 05/29/23 Follow-up Provider: Covering APP for PCP Specialist Hospital Follow-up appointment  confirmed?: NA (verified not indicated per hospital discharging provider discharge notes) Do you need transportation to your follow-up appointment?: No Do you understand care options if your condition(s) worsen?: Yes-patient verbalized understanding  SDOH Interventions Today    Flowsheet Row Most Recent Value  SDOH Interventions   Food Insecurity Interventions Intervention Not Indicated  Transportation Interventions Intervention Not Indicated  [reports her "good friend" provides transportation]      TOC Interventions Today    Flowsheet Row Most Recent Value  TOC Interventions   TOC Interventions Discussed/Reviewed TOC Interventions Discussed, Arranged PCP follow  up within 7 days/Care Guide scheduled, Contacted provider for patient needs  [provided my direct contact information should questions/ concerns/ needs arise post-TOC call, prior to RN CM telephone visit 05/30/23]      Interventions Today    Flowsheet Row Most Recent Value  Chronic Disease   Chronic disease during today's visit Chronic Obstructive Pulmonary Disease (COPD), Other  [CAP]  General Interventions   General Interventions Discussed/Reviewed General Interventions Discussed, Doctor Visits, Durable Medical Equipment (DME), Referral to Nurse, Communication with  [scheduled with RN CM Care Coordinator for follow up telephone visit on 05/30/23]  Doctor Visits Discussed/Reviewed Doctor Visits Discussed, PCP  Durable Medical Equipment (DME) Other  [confirmed not currently requiring/ using assistive devices]  PCP/Specialist Visits Compliance with follow-up visit  Communication with PCP/Specialists, RN  Education Interventions   Education Provided Provided Education  Provided Verbal Education On Medication  [extensive education around medications/ medication management, purpose of/  need to promptly obtain prescribed prednisone post-hospital discharge on 05/19/23,  difference between prn Xanax and routine Celexa- reports not taking Celexa, using Xanax]  Nutrition Interventions   Nutrition Discussed/Reviewed Nutrition Discussed  Pharmacy Interventions   Pharmacy Dicussed/Reviewed Pharmacy Topics Discussed  [Full medication review with updating medication list in EHR per patient report]  Safety Interventions   Safety Discussed/Reviewed Safety Discussed  [medication safety]      Caryl Pina, RN, BSN, CCRN Alumnus RN CM Care Coordination/ Transition of Care- [facility] Care Management [phone]: direct office

## 2023-05-23 ENCOUNTER — Ambulatory Visit (INDEPENDENT_AMBULATORY_CARE_PROVIDER_SITE_OTHER): Payer: 59

## 2023-05-23 DIAGNOSIS — J309 Allergic rhinitis, unspecified: Secondary | ICD-10-CM | POA: Diagnosis not present

## 2023-05-26 IMAGING — DX DG ABDOMEN 1V
1 series · 1 of 1 positions shown · non-contrast
Comparison: 10/08/2009

CLINICAL DATA: Lower abdominal pain

EXAM:
ABDOMEN - 1 VIEW

[abdomen kub]
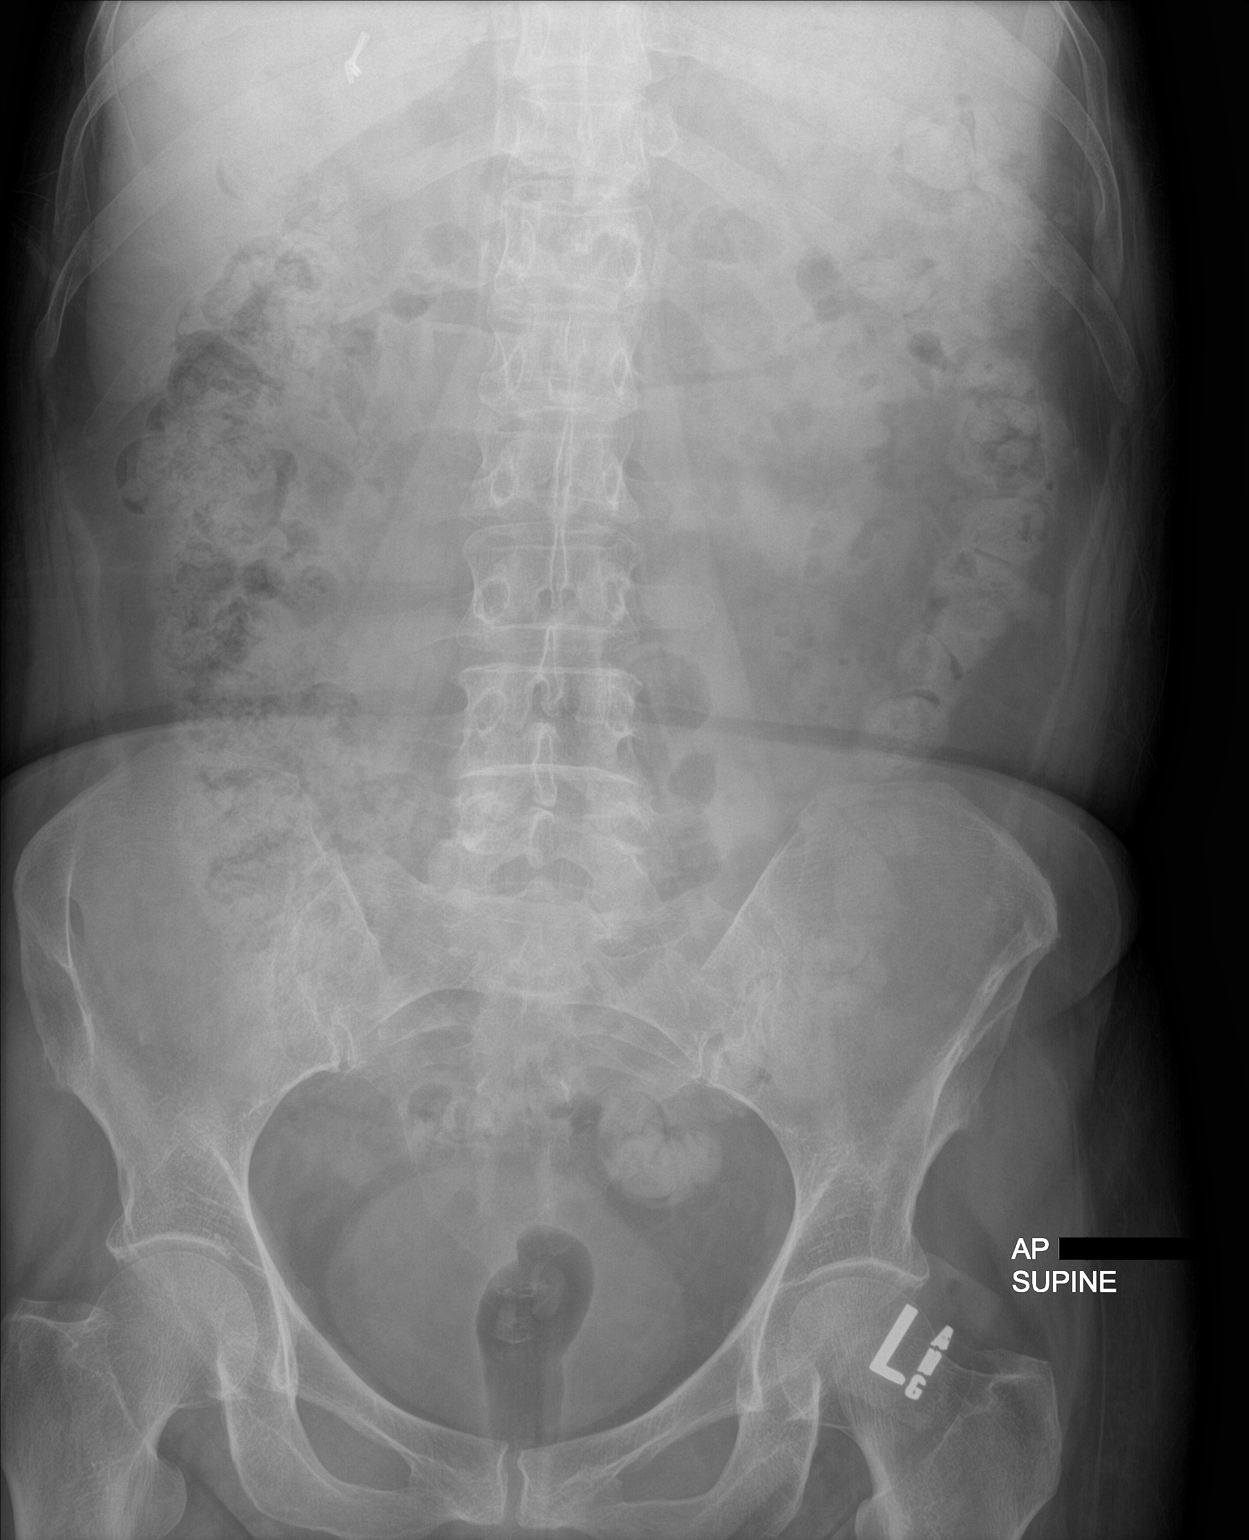

[1 of 1 positions shown; findings below may reference images not displayed]

FINDINGS: The bowel gas pattern is normal. No radio-opaque calculi or other
significant radiographic abnormality are seen.
IMPRESSION: Negative.

## 2023-05-28 ENCOUNTER — Inpatient Hospital Stay: Payer: 59 | Admitting: Internal Medicine

## 2023-05-28 ENCOUNTER — Inpatient Hospital Stay: Payer: 59 | Admitting: Family Medicine

## 2023-05-29 ENCOUNTER — Inpatient Hospital Stay: Payer: 59 | Admitting: Family Medicine

## 2023-05-30 ENCOUNTER — Ambulatory Visit (INDEPENDENT_AMBULATORY_CARE_PROVIDER_SITE_OTHER): Payer: 59

## 2023-05-30 ENCOUNTER — Ambulatory Visit: Payer: Self-pay

## 2023-05-30 ENCOUNTER — Other Ambulatory Visit: Payer: Self-pay | Admitting: Internal Medicine

## 2023-05-30 DIAGNOSIS — J309 Allergic rhinitis, unspecified: Secondary | ICD-10-CM

## 2023-05-30 NOTE — Patient Outreach (Signed)
Care Coordination   Initial Visit Note   05/30/2023 Name: Lori [name] MRN: [id number] DOB: 1956-07-28  Lori [name] is a 67 y.o. year old female who sees Corwin Levins, MD for primary care. I spoke with  [name] by phone today.  What matters to the patients health and wellness today?  [name] request to reschedule telephone assessment for next week. She expresses she is "depressed" because her dog who is up in age is not doing well. She reports she is trying to spend time with her dog. She reports she is feeling better since discharge from the hospital, has all her medications and expresses no health concerns.   Goals Addressed             This Visit's Progress    continue to improve post hospitalization       Interventions Today    Flowsheet Row Most Recent Value  Chronic Disease   Chronic disease during today's visit Chronic Obstructive Pulmonary Disease (COPD)  [admitted 8/28-05/19/23 COPD]  General Interventions   General Interventions Discussed/Reviewed General Interventions Discussed, Doctor Visits  [Assess current health status post hospitalization]  Doctor Visits Discussed/Reviewed Doctor Visits Discussed, Specialist, PCP  PCP/Specialist Visits Compliance with follow-up visit  [reviewed upcoming/schedueld appointments]  Mental Health Interventions   Mental Health Discussed/Reviewed --  [discussed LCSW available re: grief/loss]            SDOH assessments and interventions completed:  No  Care Coordination Interventions:  Yes, provided   Follow up plan: Follow up call scheduled for 06/03/23    Encounter Outcome:  Patient Visit Completed   Kathyrn Sheriff, RN, MSN, BSN, CCM Care Management Coordinator [phone]

## 2023-06-01 ENCOUNTER — Other Ambulatory Visit: Payer: Self-pay | Admitting: Internal Medicine

## 2023-06-02 ENCOUNTER — Other Ambulatory Visit: Payer: Self-pay

## 2023-06-03 ENCOUNTER — Telehealth: Payer: Self-pay

## 2023-06-03 NOTE — Patient Outreach (Signed)
Care Coordination   06/03/2023 Name: Lori [name] MRN: [id number] DOB: 1956/04/18   Care Coordination Outreach Attempts:  An unsuccessful telephone outreach was attempted for a scheduled appointment today.  Follow Up Plan:  Additional outreach attempts will be made to offer the patient care coordination information and services.   Encounter Outcome:  No Answer   Care Coordination Interventions:  No, not indicated    Kathyrn Sheriff, RN, MSN, BSN, CCM Care Management Coordinator [phone]

## 2023-06-05 ENCOUNTER — Telehealth: Payer: Self-pay | Admitting: Internal Medicine

## 2023-06-05 IMAGING — DX DG HIP (WITH OR WITHOUT PELVIS) 5+V BILAT
5 series · 5 of 5 positions shown · non-contrast
Comparison: None.

CLINICAL DATA: Bilateral hip pain after fall.

EXAM:
DG HIP (WITH OR WITHOUT PELVIS) 5+V BILAT

[pelvis ap]
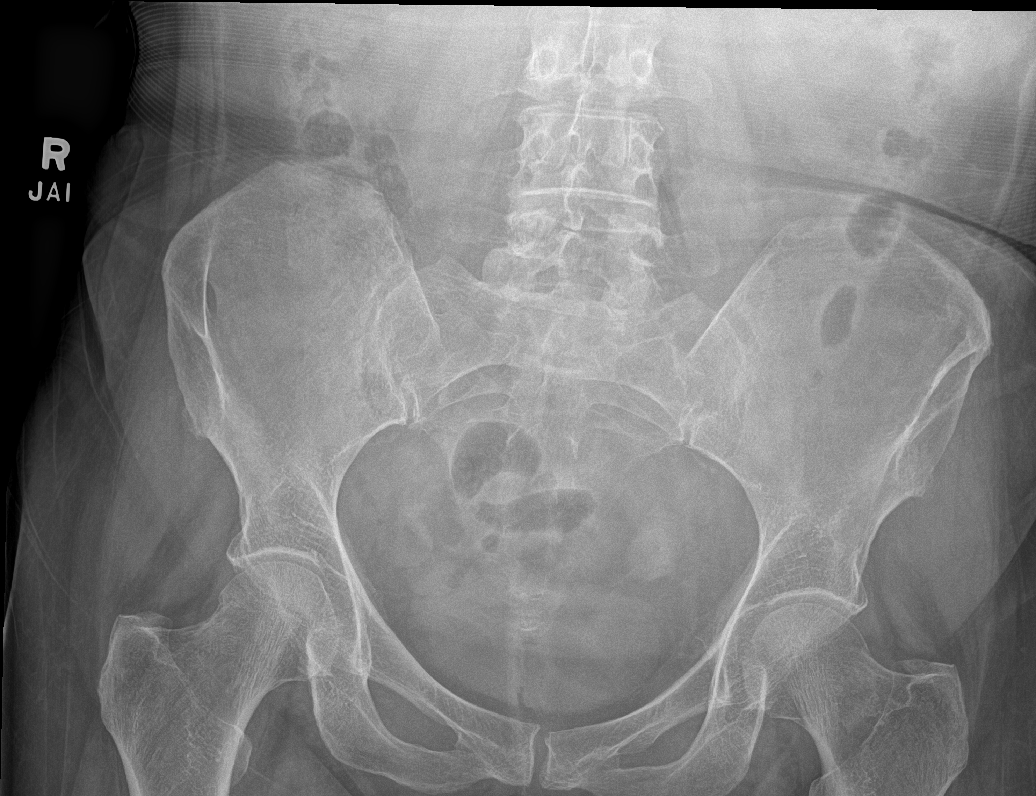

[hip ap (1 of 2)]
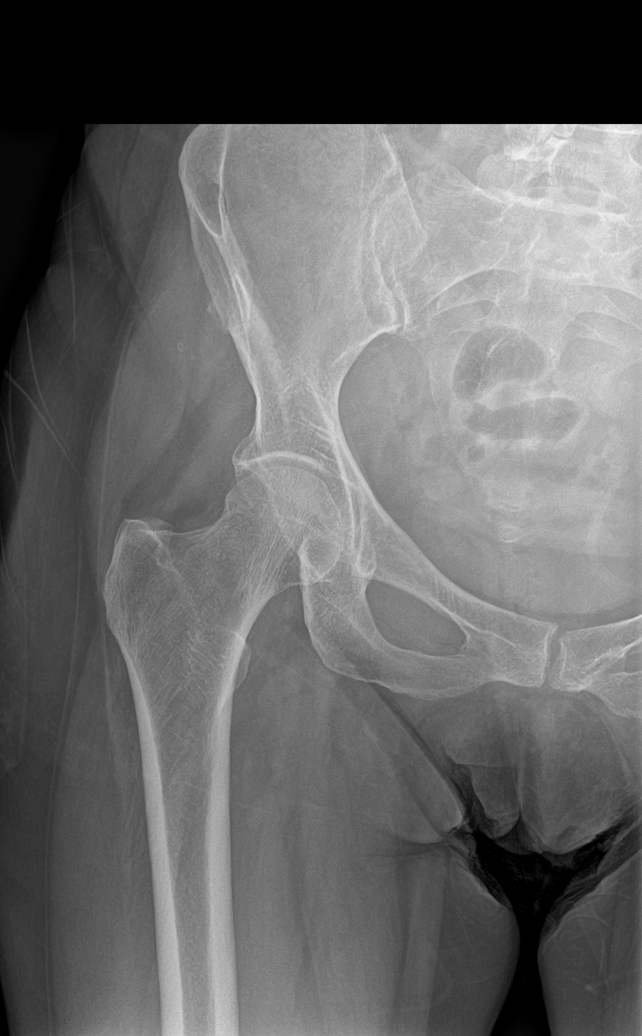

[hip frog leg (1 of 2)]
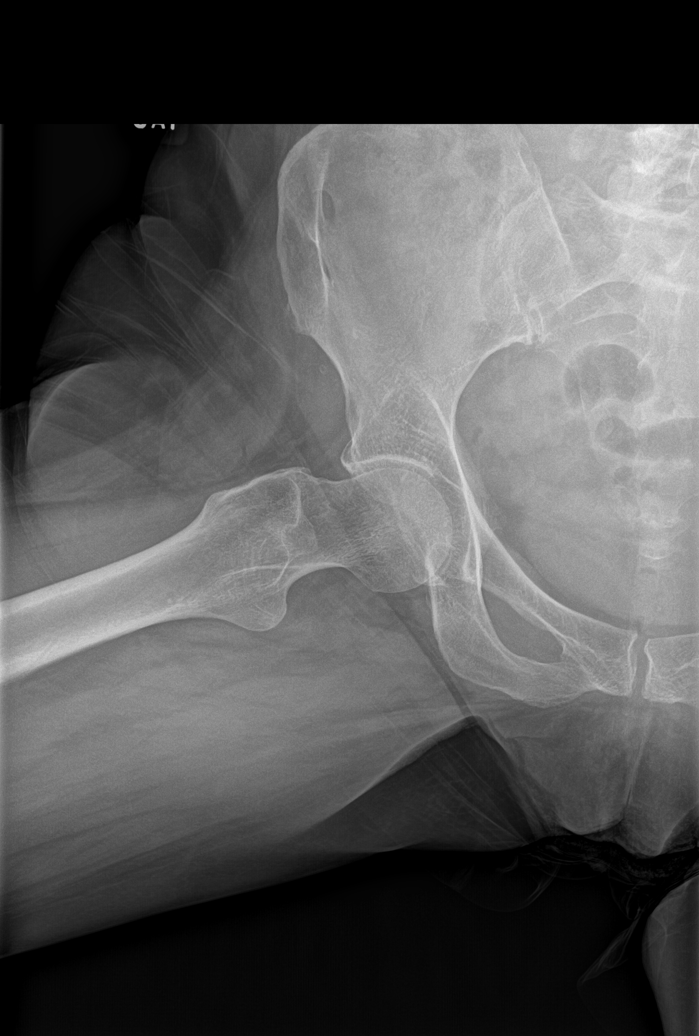

[hip ap (2 of 2)]
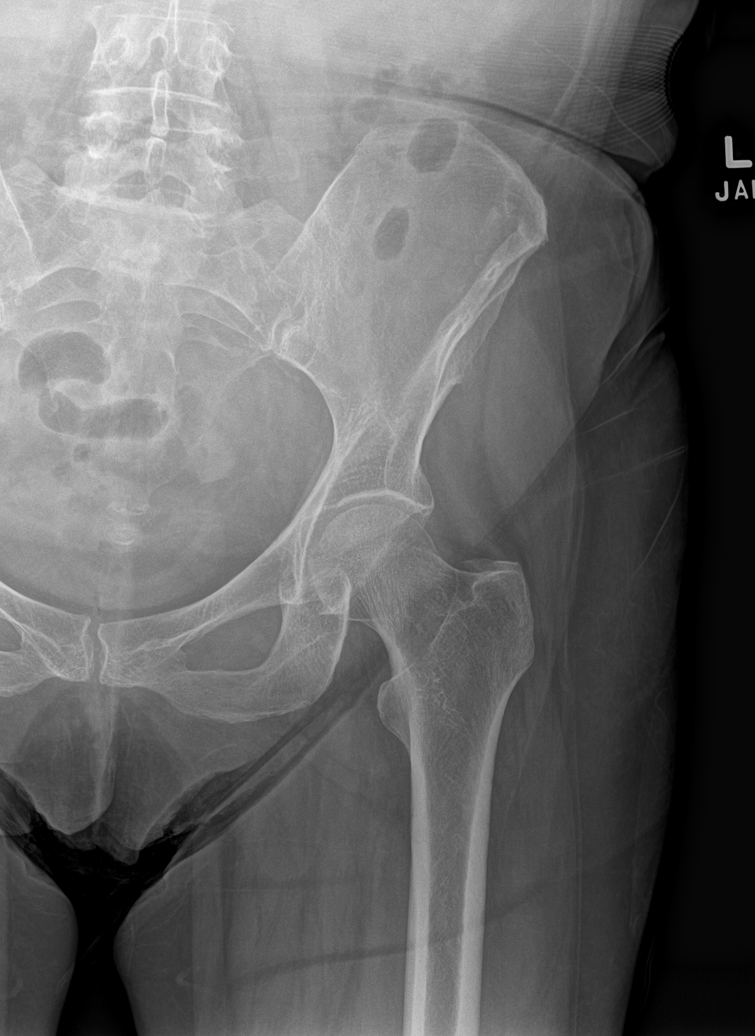

[hip frog leg (2 of 2)]
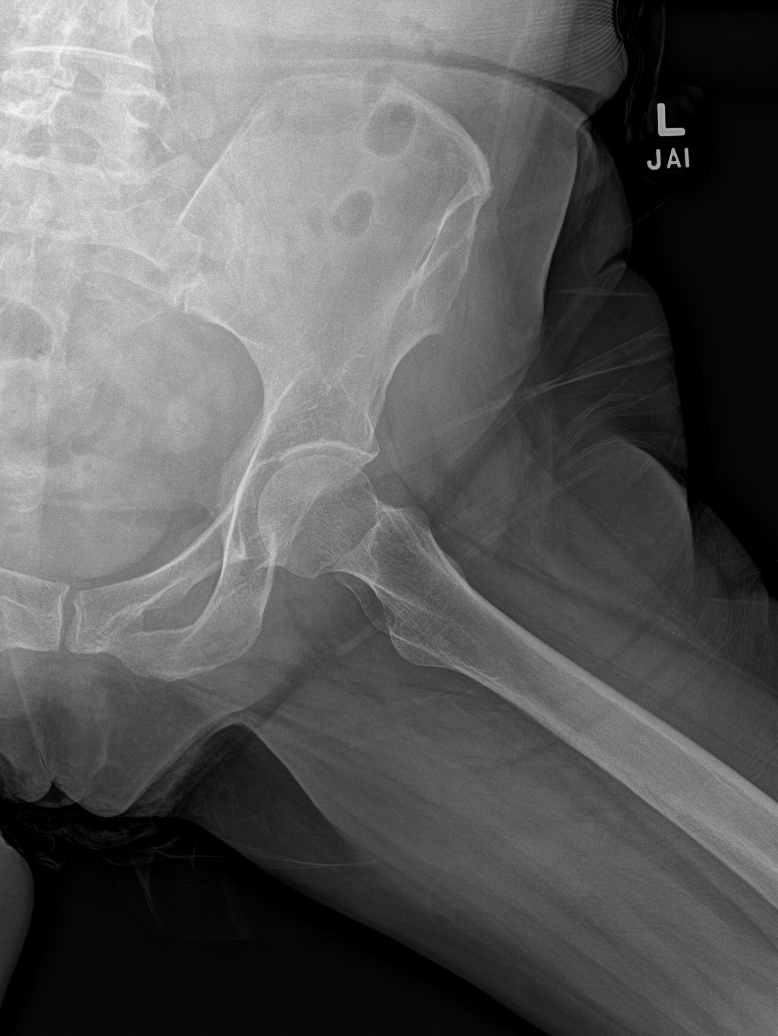

[5 of 5 positions shown; findings below may reference images not displayed]

FINDINGS: There is no evidence of hip fracture or dislocation. There is no
evidence of arthropathy or other focal bone abnormality.
IMPRESSION: Negative.

## 2023-06-05 IMAGING — DX DG CERVICAL SPINE COMPLETE 4+V
5 series · 5 of 5 positions shown · non-contrast
Comparison: May 05, 2011.

CLINICAL DATA: Neck pain after fall.

EXAM:
CERVICAL SPINE - COMPLETE 4+ VIEW

[c-spine lat]
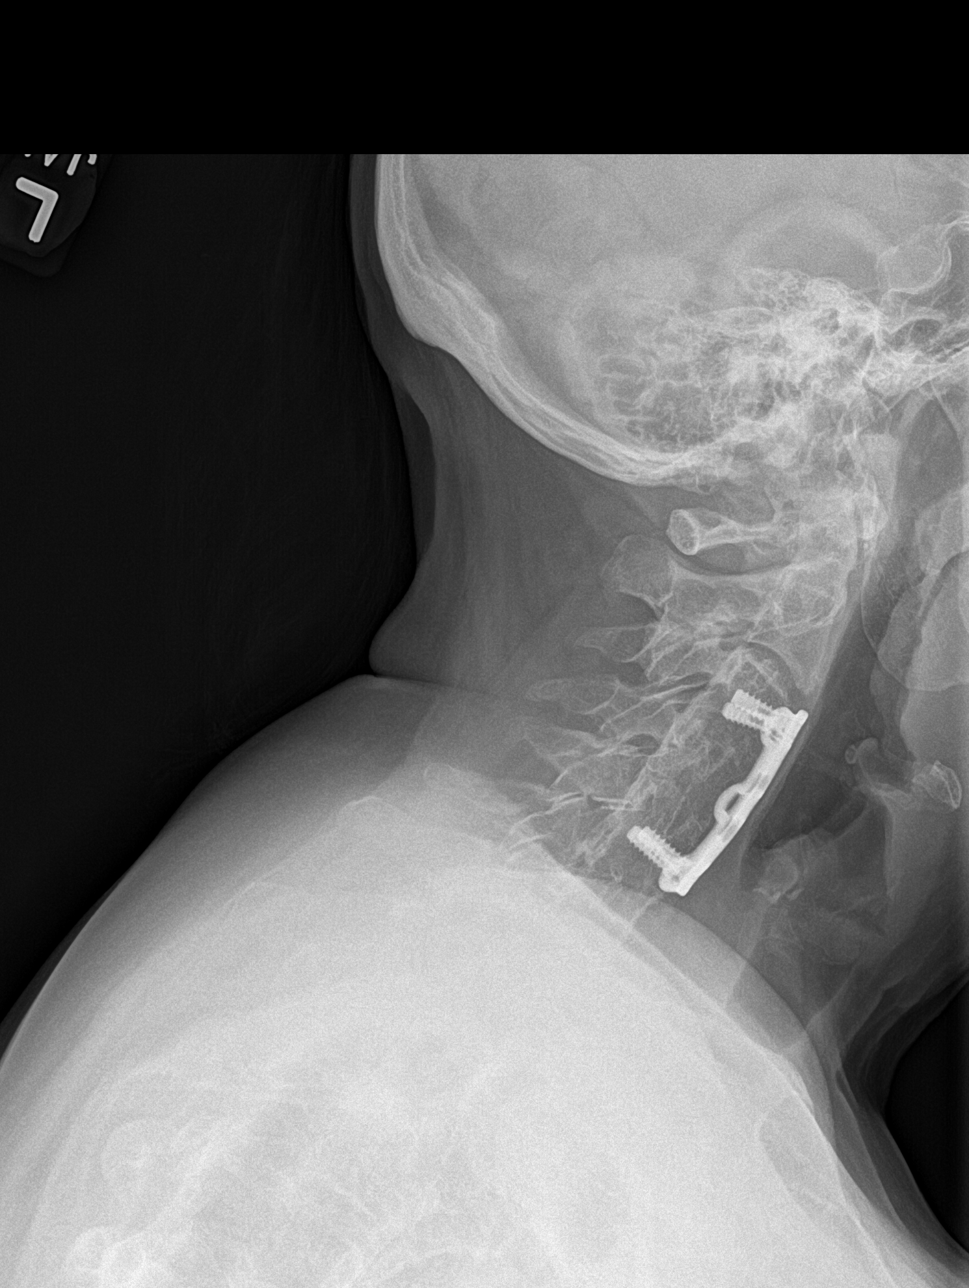

[c-spine obl (1 of 2)]
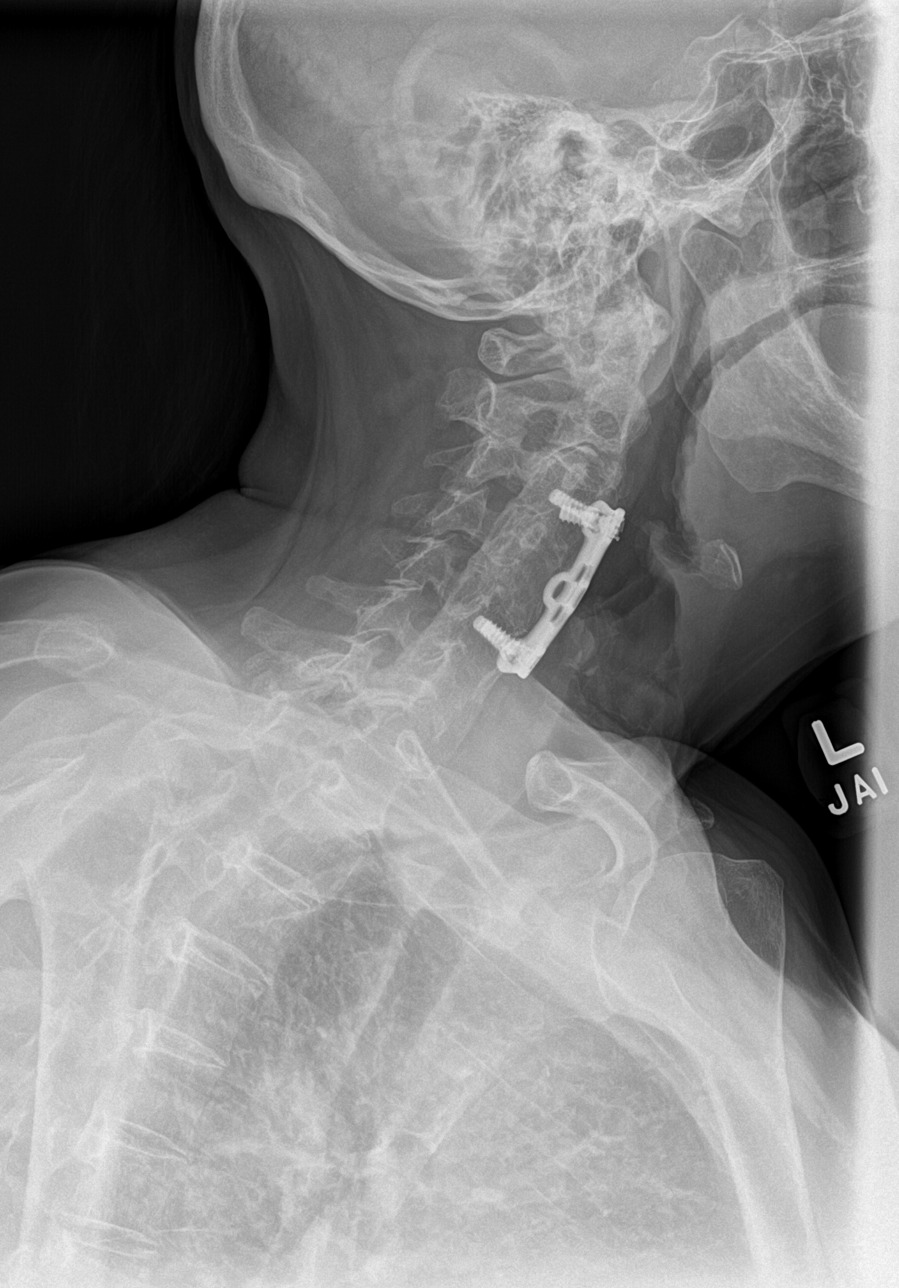

[c-spine obl (2 of 2)]
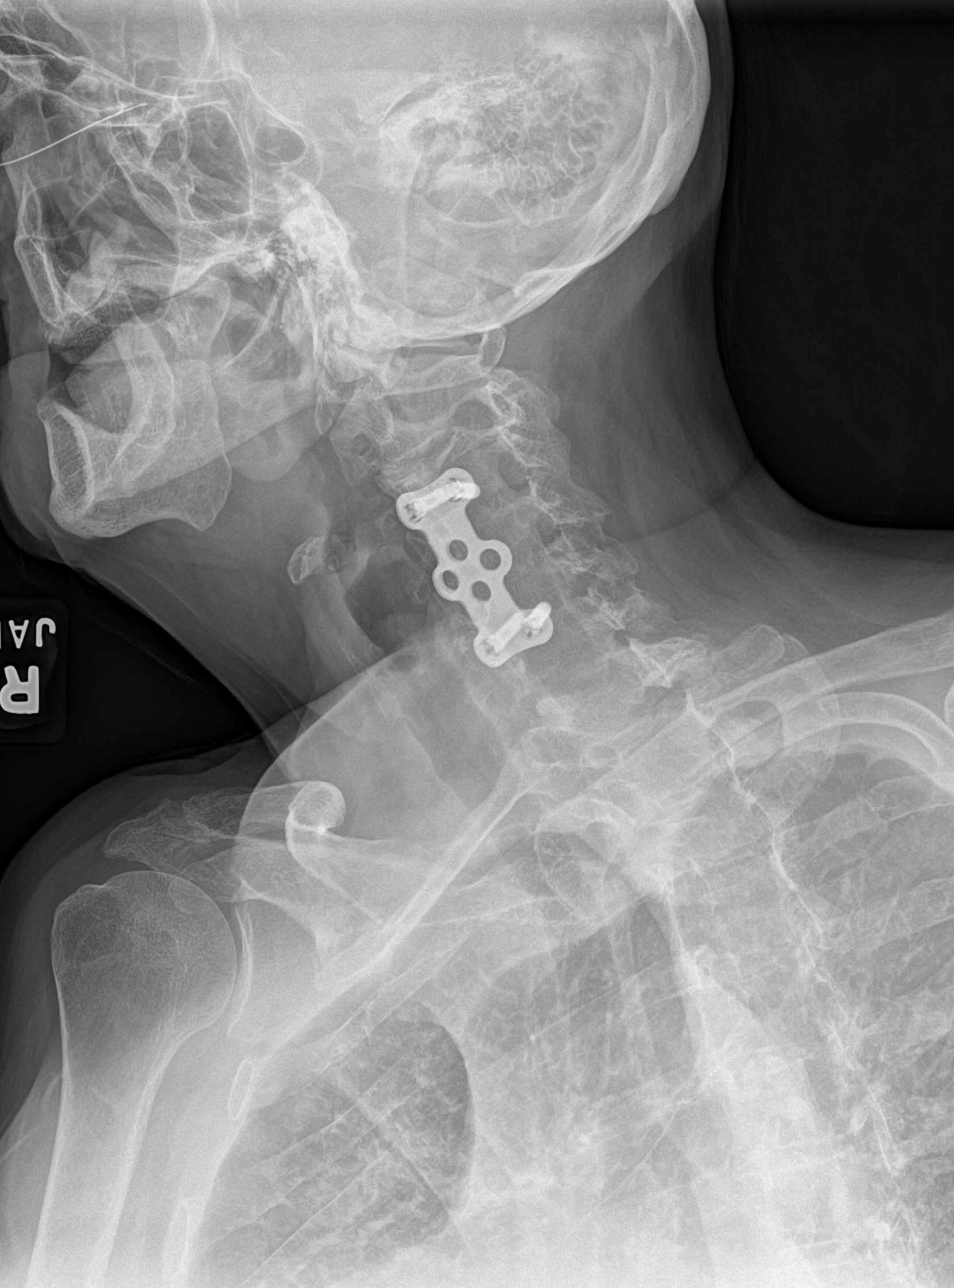

[c-spine ap]
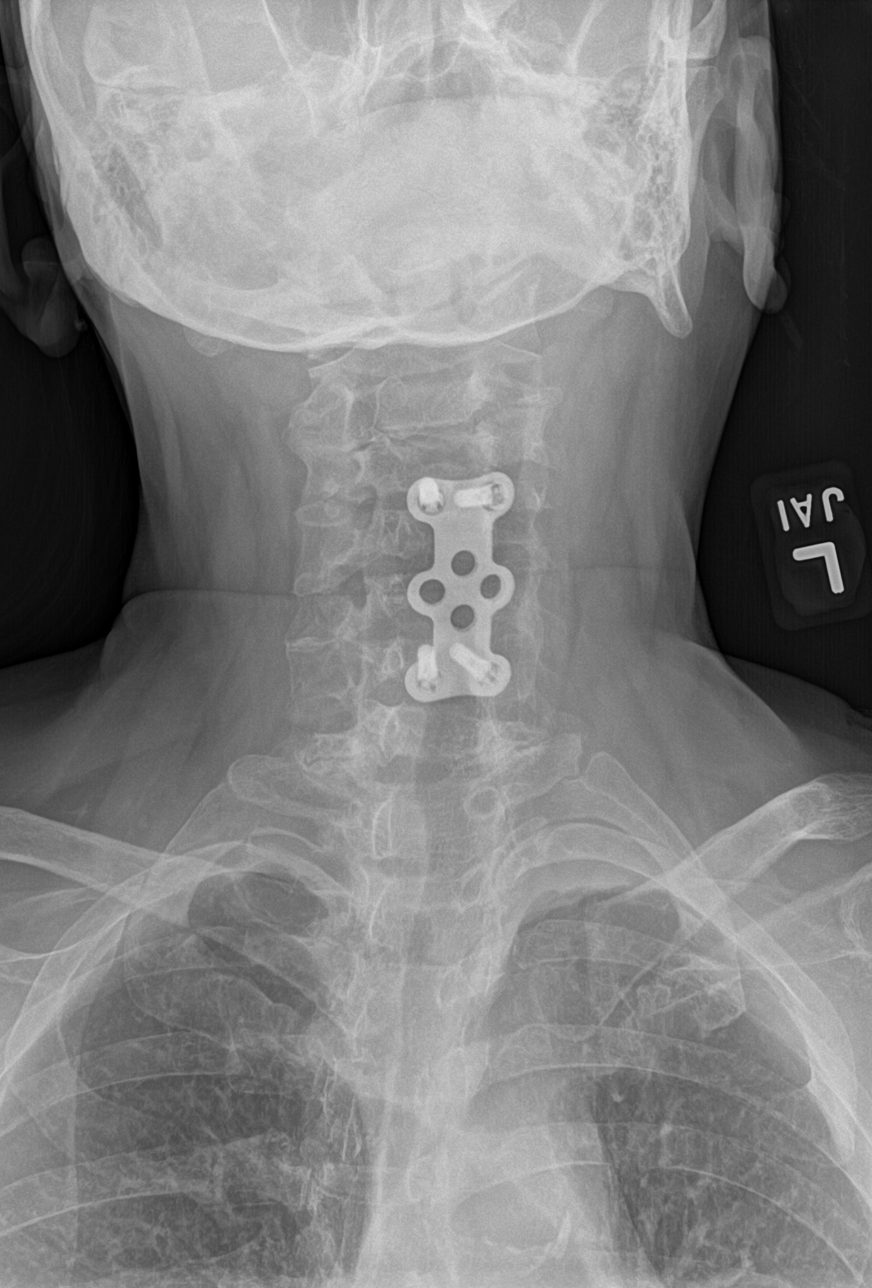

[c-spine open mouth]
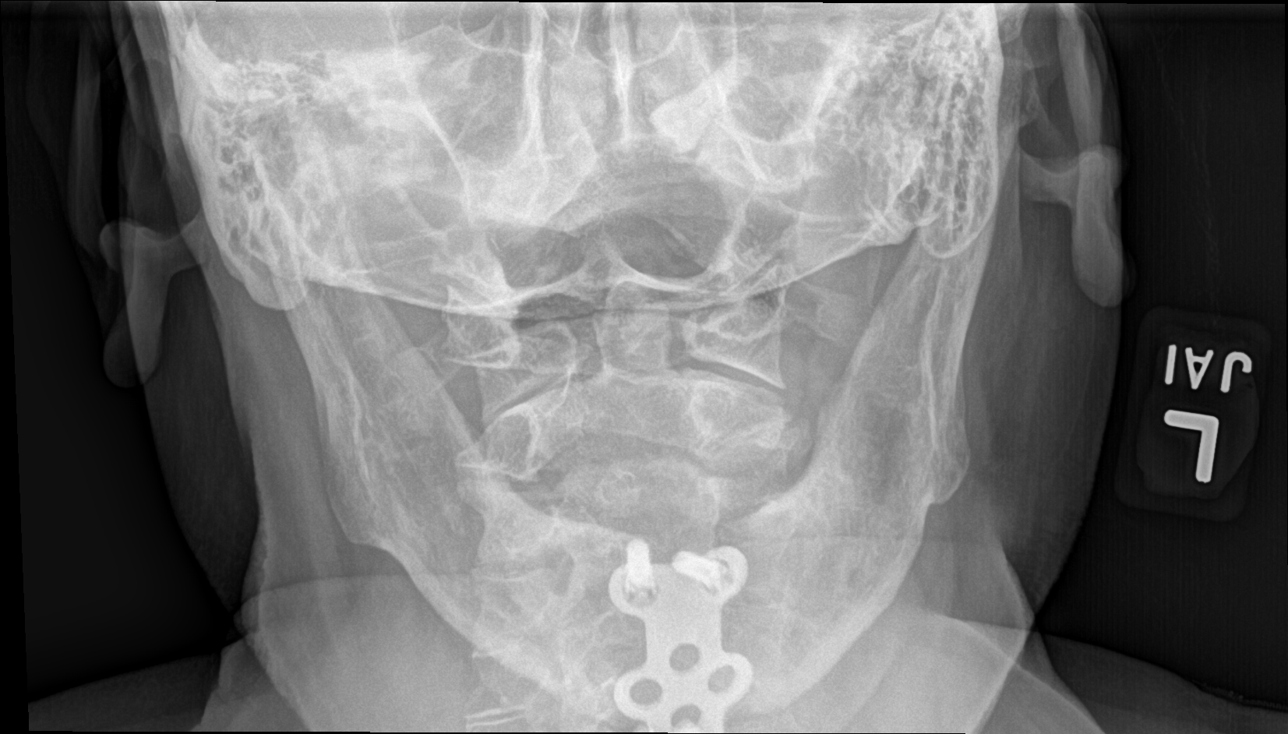

[5 of 5 positions shown; findings below may reference images not displayed]

FINDINGS: Status post surgical anterior fusion of C3-4 and C4-5. Fusion of
C5-6 and C6-7 is also noted, most likely degenerative in etiology.
No definite fracture or spondylolisthesis is noted.
IMPRESSION: Postsurgical and degenerative changes as described above. No
definite acute abnormality is noted.

## 2023-06-05 IMAGING — DX DG THORACIC SPINE 2V
2 series · 2 of 2 positions shown · non-contrast
Comparison: February 14, 2016.

CLINICAL DATA: Thoracic spine pain after fall.

EXAM:
THORACIC SPINE 2 VIEWS

[t-spine ap]
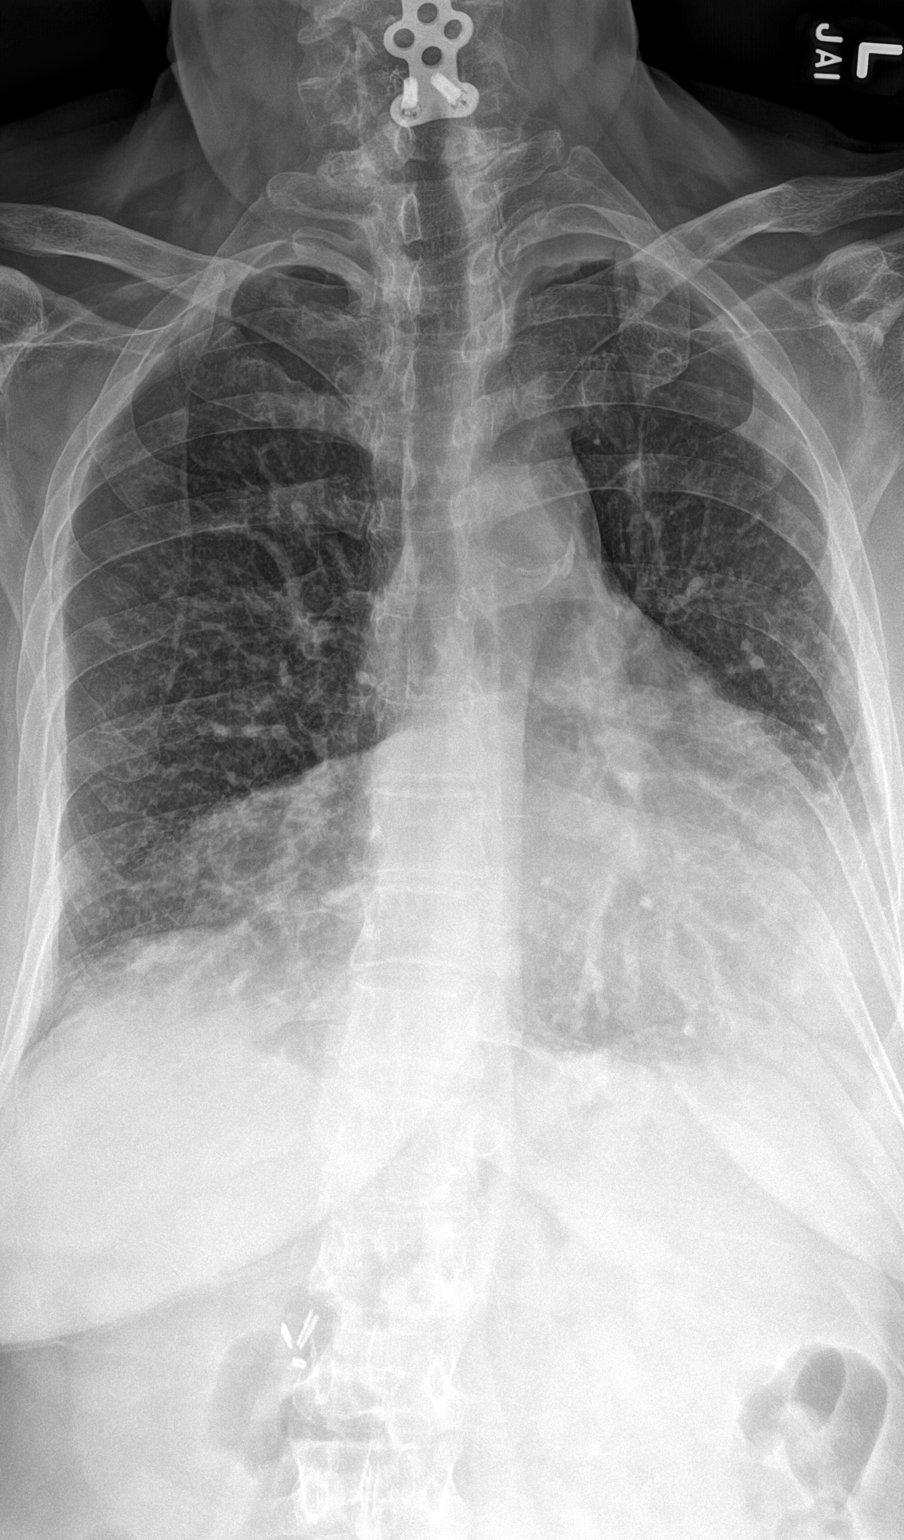

[t-spine lat]
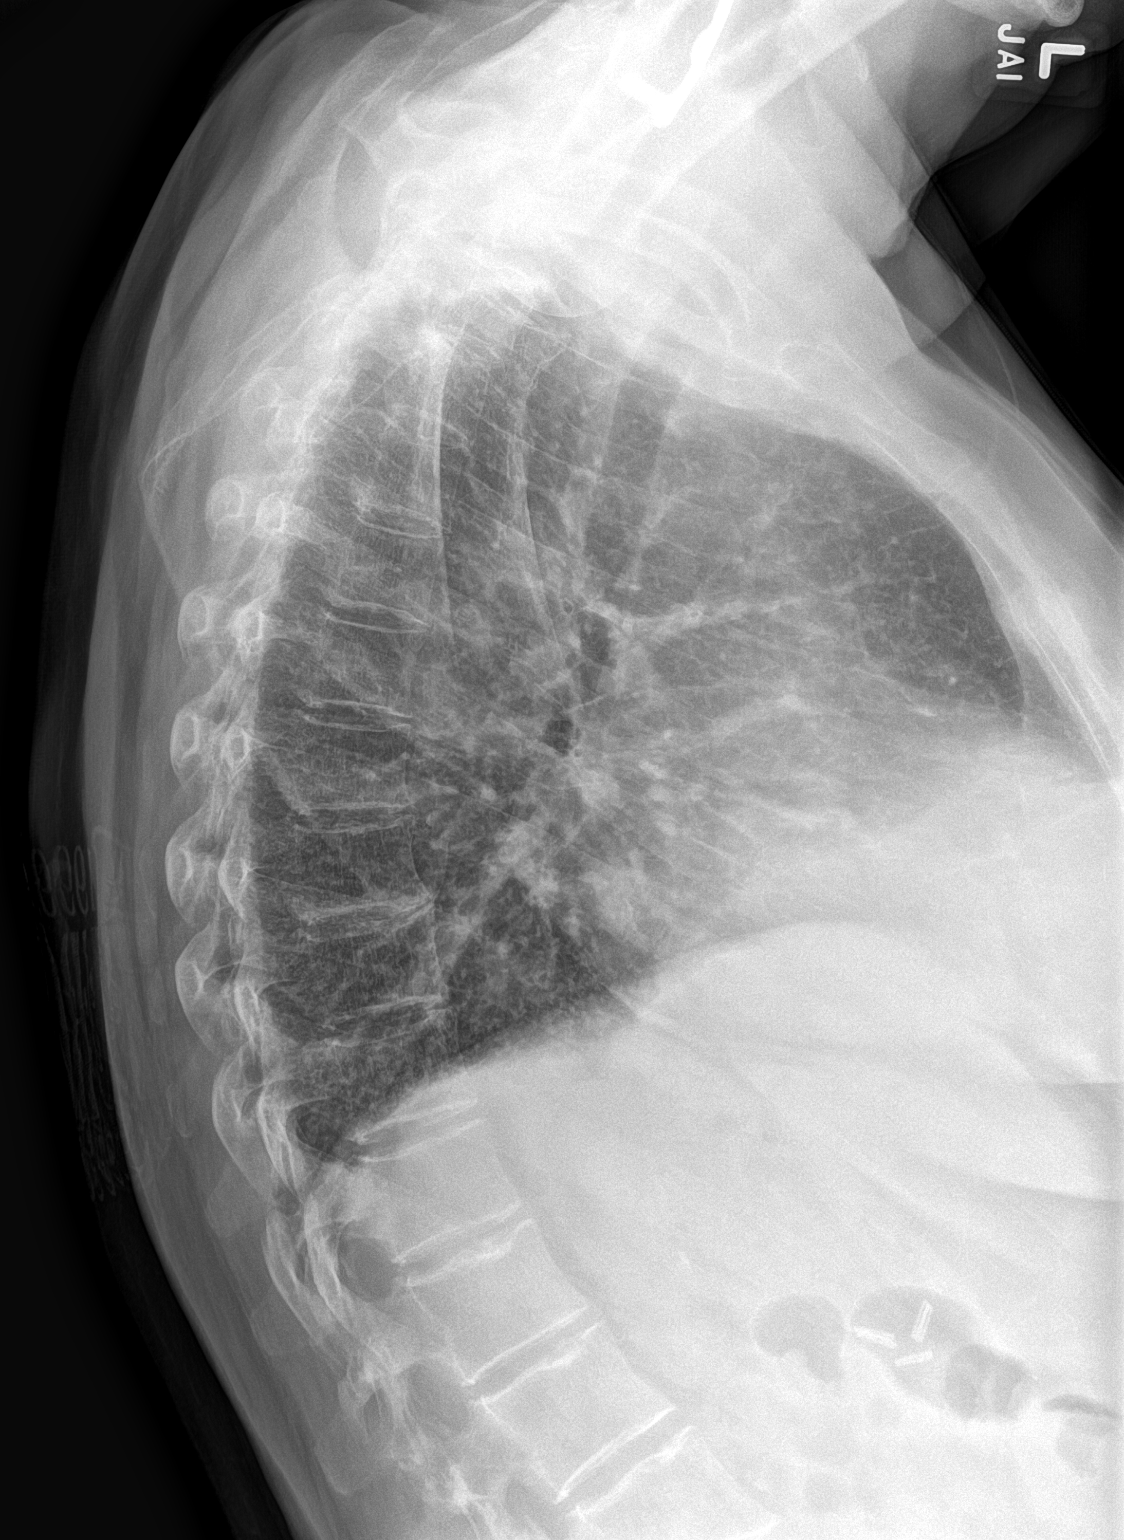

[2 of 2 positions shown; findings below may reference images not displayed]

FINDINGS: There is no evidence of thoracic spine fracture. Alignment is
normal. No other significant bone abnormalities are identified.
IMPRESSION: Negative.

## 2023-06-05 NOTE — Telephone Encounter (Signed)
Refill too soon for both.

## 2023-06-05 NOTE — Telephone Encounter (Signed)
Prescription Request  06/05/2023  LOV: 04/30/2023  What is the name of the medication or equipment? montelukast (SINGULAIR) 10 MG tablet  meclizine (ANTIVERT) 12.5 MG tablet  Have you contacted your pharmacy to request a refill? No   Which pharmacy would you like this sent to?  [facility] [address] [place], [place] - [address] [place] [address] Phone: [phone] Fax: [phone]    Patient notified that their request is being sent to the clinical staff for review and that they should receive a response within 2 business days.   Please advise at Mobile [phone] (mobile)

## 2023-06-06 ENCOUNTER — Ambulatory Visit (INDEPENDENT_AMBULATORY_CARE_PROVIDER_SITE_OTHER): Payer: 59 | Admitting: *Deleted

## 2023-06-06 ENCOUNTER — Ambulatory Visit (INDEPENDENT_AMBULATORY_CARE_PROVIDER_SITE_OTHER): Payer: 59 | Admitting: Internal Medicine

## 2023-06-06 ENCOUNTER — Encounter: Payer: Self-pay | Admitting: Internal Medicine

## 2023-06-06 VITALS — BP 140/90 | HR 72 | Temp 98.0°F | Ht 64.0 in | Wt 117.4 lb

## 2023-06-06 DIAGNOSIS — J189 Pneumonia, unspecified organism: Secondary | ICD-10-CM

## 2023-06-06 DIAGNOSIS — J309 Allergic rhinitis, unspecified: Secondary | ICD-10-CM | POA: Diagnosis not present

## 2023-06-06 DIAGNOSIS — Z23 Encounter for immunization: Secondary | ICD-10-CM | POA: Diagnosis not present

## 2023-06-06 DIAGNOSIS — J4489 Other specified chronic obstructive pulmonary disease: Secondary | ICD-10-CM

## 2023-06-06 DIAGNOSIS — F17210 Nicotine dependence, cigarettes, uncomplicated: Secondary | ICD-10-CM | POA: Diagnosis not present

## 2023-06-06 DIAGNOSIS — R079 Chest pain, unspecified: Secondary | ICD-10-CM

## 2023-06-06 MED ORDER — CITALOPRAM HYDROBROMIDE 20 MG PO TABS: 20.0000 mg | ORAL_TABLET | Freq: Every day | ORAL | 3 refills | Status: DC

## 2023-06-06 MED ORDER — DEXLANSOPRAZOLE 60 MG PO CPDR: 1.0000 | DELAYED_RELEASE_CAPSULE | Freq: Every day | ORAL | 3 refills | Status: DC

## 2023-06-06 MED ORDER — MONTELUKAST SODIUM 10 MG PO TABS: 10.0000 mg | ORAL_TABLET | Freq: Every day | ORAL | 3 refills | Status: DC

## 2023-06-06 MED ORDER — TRAMADOL HCL 50 MG PO TABS: 50.0000 mg | ORAL_TABLET | Freq: Four times a day (QID) | ORAL | 0 refills | Status: DC | PRN

## 2023-06-06 MED ORDER — NITROGLYCERIN 0.4 MG SL SUBL: 0.4000 mg | SUBLINGUAL_TABLET | SUBLINGUAL | 3 refills | Status: AC | PRN

## 2023-06-06 MED ORDER — PROMETHAZINE HCL 12.5 MG PO TABS: 12.5000 mg | ORAL_TABLET | Freq: Three times a day (TID) | ORAL | 1 refills | Status: DC | PRN

## 2023-06-06 NOTE — Patient Instructions (Addendum)
Your EKG was done today  Please continue all other medications as before, and refills have been done if requested - phenergan, tramadol, and other as listed  Please have the pharmacy call with any other refills you may need.  Please continue your efforts at being more active, low cholesterol diet, and weight control.  Please keep your appointments with your specialists as you may have planned  Please make an Appointment to return in Nov 14, or sooner if needed

## 2023-06-06 NOTE — Progress Notes (Unsigned)
Patient ID: Lori [name], female   DOB: 24-Apr-1956, 67 y.o.   MRN: [id number]        Chief Complaint: follow up recent CAP, copd exacerbation, right chest pain with hospn aug 27 - sept 2, still smoking       HPI:  Lori [name] is a 67 y.o. female here after above, still with recurring mild pleuritic sharp chest pain to the right mild intermittent , but Pt denies other chest pain, increased sob or doe, wheezing, orthopnea, PND, increased LE swelling, palpitations, dizziness or syncope.  8/27 CT angio chest shows infiltrates notably more prominent in the left lower lobe compared to right lower lobe .  She was started on Rocephin and azithromycin and admitted for further treatment of pneumonia and COPD exacerbation. She had good clinical response to antibiotics and steroids. Antibiotic course completed during hospitalization and she was continued on further prednisone course at time of d/c.  Since being at home has done ok.   Pt denies polydipsia, polyuria, or new focal neuro s/s.    Pt denies fever, wt loss, night sweats, loss of appetite, or other constitutional symptoms         Wt Readings from Last 3 Encounters:  06/06/23 117 lb 6.4 oz (53.3 kg)  05/13/23 118 lb (53.5 kg)  04/30/23 115 lb 6.4 oz (52.3 kg)   BP Readings from Last 3 Encounters:  06/06/23 (!) 140/90  05/19/23 131/68  04/30/23 112/70         Past Medical History:  Diagnosis Date   Allergic rhinitis    Anemia    hgb 13.9 on 11/22/21   Ankle fracture 05/2016   Anxiety    Arthritis    Asthma    Barrett esophagus 2007   Chest pain    Hx, no current problems as of 11/22/21.  Recurrent episodes.  Has had 3 negative nuclear stress test, and a completely normal coronary CT angiogram   COPD (chronic obstructive pulmonary disease) with chronic bronchitis    & Emphysema   Depression    Duodenitis without mention of hemorrhage 2007   Esophageal reflux 2007   Esophageal stricture    Full dentures    patient does not wear  dentures as of 11/22/21   H/O hiatal hernia    Hiatal hernia 2006,2007   HSV infection    Hyperlipidemia    rx crestor but pt not taking   Multiple fractures    from falls, fx rt. elbow, fx left wrist, bilateral ankles   Pneumonia 10/2016   Restrictive lung disease 06/30/2017   Ulcer    no current problems as of 11/22/21   Past Surgical History:  Procedure Laterality Date   ABDOMINAL HYSTERECTOMY     BSO   APPENDECTOMY     BIOPSY  04/04/2022   Procedure: BIOPSY;  Surgeon: Lemar Lofty., MD;  Location: [facility] ENDOSCOPY;  Service: Gastroenterology;;   CESAREAN SECTION     x 2   CHOLECYSTECTOMY     CHONDROPLASTY Left 01/06/2015   Procedure: CHONDROPLASTY;  Surgeon: Eldred Manges, MD;  Location: [facility];  Service: Orthopedics;  Laterality: Left;   COLONOSCOPY     CT CTA CORONARY W/CA SCORE W/CM &/OR WO/CM  12/02/2016   CORONARY CALCIUM SCORE 0.  NO EVIDENCE OF CAD.  Normal-sized PA.  Normal pulmonary venous drainage the left atrium.  No ASD or VSD.   ELBOW FRACTURE SURGERY     right   ESOPHAGOGASTRODUODENOSCOPY (EGD) WITH  PROPOFOL N/A 04/04/2022   Procedure: ESOPHAGOGASTRODUODENOSCOPY (EGD) WITH PROPOFOL;  Surgeon: Meridee Score Netty Starring., MD;  Location: [facility] ENDOSCOPY;  Service: Gastroenterology;  Laterality: N/A;   EUS N/A 04/04/2022   Procedure: UPPER ENDOSCOPIC ULTRASOUND (EUS) RADIAL;  Surgeon: Lemar Lofty., MD;  Location: [facility] ENDOSCOPY;  Service: Gastroenterology;  Laterality: N/A;   EYE SURGERY Bilateral    cataracts removed   INSERTION OF MESH N/A 11/12/2022   Procedure: INSERTION OF MESH;  Surgeon: Axel Filler, MD;  Location: [facility] OR;  Service: General;  Laterality: N/A;   KNEE ARTHROSCOPY WITH EXCISION PLICA Left 01/06/2015   Procedure: KNEE ARTHROSCOPY WITH EXCISION PLICA;  Surgeon: Eldred Manges, MD;  Location: [facility];  Service: Orthopedics;  Laterality: Left;   LIGAMENT REPAIR Right 11/28/2021   Procedure: RIGHT THUMB  LIGAMENT RECONSTRUCTION AND TENDON INTERPOSITION;  Surgeon: Marlyne Beards, MD;  Location: [facility] OR;  Service: Orthopedics;  Laterality: Right;   Lower Extremity Arterial Dopplers  07/06/2013   RABI 1.0, LABI 1.1.; No evidence of significant vascular atherogenic plaque   NECK SURGERY     NM MYOVIEW LTD  09/2014   a) LOW RISK. Normal EF of 65%, no RWMA. NO ISCHEMIA OR INFARCTION.;; b) 05/02/2009: Normal study.  LOW RISK.  No ischemia or infarction.  EF 74%.  Breast attenuation noted.   TONSILLECTOMY     TRANSTHORACIC ECHOCARDIOGRAM  03/28/2021   a) Normal EF 60 to 65%.  GR 1 DD.  No R WMA.  Normal RV size and function.  Normal RAP.  Normal valves. => NORMAL;; b) 04/23/16/2010: Normal study.  LOW RISK.  No ischemia or infarction.  EF 74%.  Breast attenuation noted.17/2010: (SHVC) EF>55%.  Normal wall motion.  Normal valves.   WRIST FRACTURE SURGERY     left, has plate   XI ROBOTIC ASSISTED PARAESOPHAGEAL HERNIA REPAIR N/A 11/12/2022   Procedure: ROBOTIC DIAPHRAGMATIC HERNIA REPAIR WITH MESH;  Surgeon: Axel Filler, MD;  Location: [facility] OR;  Service: General;  Laterality: N/A;    reports that she has been smoking cigarettes. She started smoking about 45 years ago. She has a 22.9 pack-year smoking history. She has never used smokeless tobacco. She reports that she does not drink alcohol and does not use drugs. family history includes Dementia in her mother; Diabetes in her maternal aunt, maternal grandmother, and another family member; Emphysema in her father; Heart disease in her brother. Allergies  Allergen Reactions   Advil [Ibuprofen] Nausea And Vomiting   Bee Venom Swelling   Codeine Nausea Only and Other (See Comments)    CAUSES ULCERS   Clonidine Derivatives Other (See Comments)    Unknown    Doxycycline Other (See Comments)    nausea   Statins Other (See Comments)    unknown   Latex Rash   Levaquin [Levofloxacin] Nausea Only   Propoxyphene N-Acetaminophen Nausea Only   Current  Outpatient Medications on File Prior to Visit  Medication Sig Dispense Refill   albuterol (PROAIR HFA) 108 (90 Base) MCG/ACT inhaler Inhale 2 puffs into the lungs every 6 (six) hours as needed for wheezing or shortness of breath. 1 each 2   ALPRAZolam (XANAX) 1 MG tablet TAKE 1 TABLET BY MOUTH AT BEDTIME AS NEEDED FOR ANXIETY 90 tablet 1   Benralizumab (FASENRA) 30 MG/ML SOSY INJECT 30MG  SUBCUTANEOUSLY  EVERY 8 WEEKS 1 mL 6   cyclobenzaprine (FLEXERIL) 5 MG tablet Take 1 tablet (5 mg total) by mouth 3 (three) times daily as needed for muscle spasms.  40 tablet 2   EPINEPHrine 0.3 mg/0.3 mL IJ SOAJ injection INJECT AS DIRECTED FOR SEVERE ALLERGIC REACTION (Patient taking differently: Inject 0.3 mg into the muscle as needed for anaphylaxis (See Instructions). INJECT AS DIRECTED FOR SEVERE ALLERGIC REACTION) 2 each 1   Erenumab-aooe 140 MG/ML SOAJ Inject 140 mg into the skin every 30 (thirty) days.     gabapentin (NEURONTIN) 600 MG tablet Take 600 mg by mouth 2 (two) times daily.     meclizine (ANTIVERT) 12.5 MG tablet TAKE 1 TABLET BY MOUTH THREE TIMES DAILY AS NEEDED FOR DIZZINESS 60 tablet 0   valACYclovir (VALTREX) 1000 MG tablet Take 1 tablet by mouth twice daily 180 tablet 0   Current Facility-Administered Medications on File Prior to Visit  Medication Dose Route Frequency Provider Last Rate Last Admin   Benralizumab SOSY 30 mg  30 mg Subcutaneous Q8 weeks Alfonse Spruce, MD   30 mg at 05/02/23 0920        ROS:  All others reviewed and negative.  Objective        PE:  BP (!) 140/90 (BP Location: Left Arm, Patient Position: Sitting, Cuff Size: Normal)   Pulse 72   Temp 98 F (36.7 C) (Oral)   Ht 5\' 4"  (1.626 m)   Wt 117 lb 6.4 oz (53.3 kg)   SpO2 98%   BMI 20.15 kg/m                 Constitutional: Pt appears in NAD               HENT: Head: NCAT.                Right Ear: External ear normal.                 Left Ear: External ear normal.                Eyes: . Pupils are  equal, round, and reactive to light. Conjunctivae and EOM are normal               Nose: without d/c or deformity               Neck: Neck supple. Gross normal ROM               Cardiovascular: Normal rate and regular rhythm.                 Pulmonary/Chest: Effort normal and breath sounds without rales or wheezing.                Abd:  Soft, NT, ND, + BS, no organomegaly               Neurological: Pt is alert. At baseline orientation, motor grossly intact               Skin: Skin is warm. No rashes, no other new lesions, LE edema - none               Psychiatric: Pt behavior is normal without agitation   Micro: none  Cardiac tracings I have personally interpreted today:  ECG - NSR - 67  Pertinent Radiological findings (summarize): none   Lab Results  Component Value Date   WBC 12.7 (H) 05/16/2023   HGB 13.1 05/16/2023   HCT 39.2 05/16/2023   PLT 236 05/16/2023   GLUCOSE 147 (H) 05/16/2023   CHOL 286 (H) 04/09/2023   TRIG (H) 04/09/2023  549.0 Triglyceride is over 400; calculations on Lipids are invalid.   HDL 36.10 (L) 04/09/2023   LDLDIRECT 178.0 04/09/2023   LDLCALC Comment 03/12/2017   ALT 15 05/14/2023   AST 20 05/14/2023   NA 133 (L) 05/16/2023   K 4.5 05/16/2023   CL 104 05/16/2023   CREATININE 0.90 05/16/2023   BUN 17 05/16/2023   CO2 19 (L) 05/16/2023   TSH 1.42 01/10/2022   INR 0.9 08/31/2008   HGBA1C 5.3 04/09/2023   Assessment/Plan:  Lori [name] is a 67 y.o. White or Caucasian [1] female with  has a past medical history of Allergic rhinitis, Anemia, Ankle fracture (05/2016), Anxiety, Arthritis, Asthma, Barrett esophagus (2007), Chest pain, COPD (chronic obstructive pulmonary disease) with chronic bronchitis, Depression, Duodenitis without mention of hemorrhage (2007), Esophageal reflux (2007), Esophageal stricture, Full dentures, H/O hiatal hernia, Hiatal hernia (1610,9604), HSV infection, Hyperlipidemia, Multiple fractures, Pneumonia (10/2016),  Restrictive lung disease (06/30/2017), and Ulcer.  Chest pain Atypiical, ecg reviewed, very low suspicion for cardiac, declines cxr and pt o/w reassured regarding ecg,  to f/u any worsening symptoms or concerns  Community acquired pneumonia of left lower lobe of lung Clinically resolved, pt reassured  Asthma-COPD overlap syndrome (HCC) Stable overall, pt to continue inhaler prn  Cigarette smoker Pt counsled to quit, pt not ready  Followup: No follow-ups on file.  Oliver Barre, MD 06/08/2023 11:57 AM [facility]

## 2023-06-08 DIAGNOSIS — R079 Chest pain, unspecified: Secondary | ICD-10-CM | POA: Insufficient documentation

## 2023-06-08 NOTE — Assessment & Plan Note (Signed)
Atypiical, ecg reviewed, very low suspicion for cardiac, declines cxr and pt o/w reassured regarding ecg,  to f/u any worsening symptoms or concerns

## 2023-06-08 NOTE — Assessment & Plan Note (Signed)
Pt counsled to quit, pt not ready °

## 2023-06-08 NOTE — Assessment & Plan Note (Signed)
Clinically resolved, pt reassured

## 2023-06-08 NOTE — Assessment & Plan Note (Signed)
Stable overall, pt to continue inhaler prn

## 2023-06-09 DIAGNOSIS — F17210 Nicotine dependence, cigarettes, uncomplicated: Secondary | ICD-10-CM | POA: Diagnosis not present

## 2023-06-09 DIAGNOSIS — Z23 Encounter for immunization: Secondary | ICD-10-CM | POA: Diagnosis not present

## 2023-06-09 DIAGNOSIS — R079 Chest pain, unspecified: Secondary | ICD-10-CM | POA: Diagnosis not present

## 2023-06-10 ENCOUNTER — Ambulatory Visit (INDEPENDENT_AMBULATORY_CARE_PROVIDER_SITE_OTHER): Payer: 59

## 2023-06-10 DIAGNOSIS — J309 Allergic rhinitis, unspecified: Secondary | ICD-10-CM | POA: Diagnosis not present

## 2023-06-10 DIAGNOSIS — G43719 Chronic migraine without aura, intractable, without status migrainosus: Secondary | ICD-10-CM | POA: Diagnosis not present

## 2023-06-10 NOTE — Addendum Note (Signed)
Addended by: Racheal Patches on: 06/10/2023 03:57 PM   Modules accepted: Orders

## 2023-06-11 ENCOUNTER — Telehealth: Payer: Self-pay | Admitting: *Deleted

## 2023-06-11 NOTE — Progress Notes (Unsigned)
Care Coordination Note  06/11/2023 Name: Lori [name] MRN: [id number] DOB: 10-01-1955  Lori [name] is a 67 y.o. year old female who is a primary care patient of Corwin Levins, MD and is actively engaged with the care management team. I reached out to [name] by phone today to assist with re-scheduling a follow up visit with the RN Case Manager  Follow up plan: Unsuccessful telephone outreach attempt made. A HIPAA compliant phone message was left for the patient providing contact information and requesting a return call.   Burman Nieves, CCMA Care Coordination Care Guide Direct Dial: [phone]

## 2023-06-12 NOTE — Progress Notes (Signed)
Care Coordination Note  06/12/2023 Name: Lori [name] MRN: [id number] DOB: [birth year]  Lori [name] is a 67 y.o. year old female who is a primary care patient of Corwin Levins, MD and is actively engaged with the care management team. I reached out to [name] by phone today to assist with re-scheduling a follow up visit with the RN Case Manager  Follow up plan: Telephone appointment with care management team member scheduled for: 06/25/2023  Burman Nieves, [facility] Care Coordination Care Guide Direct Dial: [phone]

## 2023-06-16 ENCOUNTER — Ambulatory Visit (INDEPENDENT_AMBULATORY_CARE_PROVIDER_SITE_OTHER): Payer: 59

## 2023-06-16 DIAGNOSIS — J309 Allergic rhinitis, unspecified: Secondary | ICD-10-CM | POA: Diagnosis not present

## 2023-06-25 ENCOUNTER — Ambulatory Visit: Payer: Self-pay

## 2023-06-25 NOTE — Patient Outreach (Signed)
Care Coordination   Follow Up Visit Note   06/25/2023 Name: Lori [name] MRN: [id number] DOB: [birth year]  Lori [name] is a 67 y.o. year old female who sees Lori Levins, MD for primary care. I spoke with  Lori [name] by phone today.  What matters to the patients health and wellness today?  Lori [name] reports she is busy at this time and request to reschedule telephone call.    Goals Addressed             This Visit's Progress    continue to improve post hospitalization       Interventions Today    Flowsheet Row Most Recent Value  General Interventions   General Interventions Discussed/Reviewed General Interventions Reviewed  [facility] rescheduled telephone visit per patient request]            SDOH assessments and interventions completed:  No  Care Coordination Interventions:  Yes, provided   Follow up plan: Follow up call scheduled for 07/07/23    Encounter Outcome:  Patient Visit Completed   Kathyrn Sheriff, RN, MSN, BSN, CCM Care Management Coordinator [phone]

## 2023-06-27 ENCOUNTER — Ambulatory Visit: Payer: 59

## 2023-06-27 DIAGNOSIS — J4550 Severe persistent asthma, uncomplicated: Secondary | ICD-10-CM

## 2023-06-28 ENCOUNTER — Other Ambulatory Visit: Payer: Self-pay | Admitting: Internal Medicine

## 2023-06-30 ENCOUNTER — Encounter: Payer: Self-pay | Admitting: Internal Medicine

## 2023-06-30 ENCOUNTER — Ambulatory Visit (INDEPENDENT_AMBULATORY_CARE_PROVIDER_SITE_OTHER): Payer: 59 | Admitting: Internal Medicine

## 2023-06-30 ENCOUNTER — Ambulatory Visit (INDEPENDENT_AMBULATORY_CARE_PROVIDER_SITE_OTHER): Payer: 59

## 2023-06-30 ENCOUNTER — Other Ambulatory Visit: Payer: Self-pay

## 2023-06-30 VITALS — BP 110/80 | HR 68 | Temp 98.5°F | Ht 64.0 in | Wt 114.8 lb

## 2023-06-30 DIAGNOSIS — F411 Generalized anxiety disorder: Secondary | ICD-10-CM

## 2023-06-30 DIAGNOSIS — J309 Allergic rhinitis, unspecified: Secondary | ICD-10-CM | POA: Diagnosis not present

## 2023-06-30 DIAGNOSIS — F17210 Nicotine dependence, cigarettes, uncomplicated: Secondary | ICD-10-CM | POA: Diagnosis not present

## 2023-06-30 DIAGNOSIS — R112 Nausea with vomiting, unspecified: Secondary | ICD-10-CM | POA: Diagnosis not present

## 2023-06-30 DIAGNOSIS — E559 Vitamin D deficiency, unspecified: Secondary | ICD-10-CM

## 2023-06-30 DIAGNOSIS — R42 Dizziness and giddiness: Secondary | ICD-10-CM | POA: Diagnosis not present

## 2023-06-30 DIAGNOSIS — R252 Cramp and spasm: Secondary | ICD-10-CM

## 2023-06-30 DIAGNOSIS — J4489 Other specified chronic obstructive pulmonary disease: Secondary | ICD-10-CM

## 2023-06-30 LAB — CBC WITH DIFFERENTIAL/PLATELET
Basophils Absolute: 0 10*3/uL (ref 0.0–0.1)
Basophils Relative: 0.2 % (ref 0.0–3.0)
Eosinophils Absolute: 0 10*3/uL (ref 0.0–0.7)
Eosinophils Relative: 0 % (ref 0.0–5.0)
HCT: 40.2 % (ref 36.0–46.0)
Hemoglobin: 13.5 g/dL (ref 12.0–15.0)
Lymphocytes Relative: 28.8 % (ref 12.0–46.0)
Lymphs Abs: 2.6 10*3/uL (ref 0.7–4.0)
MCHC: 33.5 g/dL (ref 30.0–36.0)
MCV: 95.7 fL (ref 78.0–100.0)
Monocytes Absolute: 0.6 10*3/uL (ref 0.1–1.0)
Monocytes Relative: 6.2 % (ref 3.0–12.0)
Neutro Abs: 5.9 10*3/uL (ref 1.4–7.7)
Neutrophils Relative %: 64.8 % (ref 43.0–77.0)
Platelets: 279 10*3/uL (ref 150.0–400.0)
RBC: 4.2 Mil/uL (ref 3.87–5.11)
RDW: 14.3 % (ref 11.5–15.5)
WBC: 9.1 10*3/uL (ref 4.0–10.5)

## 2023-06-30 LAB — BASIC METABOLIC PANEL
BUN: 14 mg/dL (ref 6–23)
CO2: 26 meq/L (ref 19–32)
Calcium: 10.2 mg/dL (ref 8.4–10.5)
Chloride: 102 meq/L (ref 96–112)
Creatinine, Ser: 1.1 mg/dL (ref 0.40–1.20)
GFR: 52.22 mL/min — ABNORMAL LOW (ref >60.00)
Glucose, Bld: 105 mg/dL — ABNORMAL HIGH (ref 70–99)
Potassium: 3.7 meq/L (ref 3.5–5.1)
Sodium: 136 meq/L (ref 135–145)

## 2023-06-30 LAB — URINALYSIS, ROUTINE W REFLEX MICROSCOPIC
Bilirubin Urine: NEGATIVE
Ketones, ur: NEGATIVE
Nitrite: NEGATIVE
RBC / HPF: NONE SEEN (ref 0–?)
Specific Gravity, Urine: 1.015 (ref 1.000–1.030)
Total Protein, Urine: NEGATIVE
Urine Glucose: NEGATIVE
Urobilinogen, UA: 0.2 (ref 0.0–1.0)
pH: 6 (ref 5.0–8.0)

## 2023-06-30 LAB — HEPATIC FUNCTION PANEL
ALT: 24 U/L (ref 0–35)
AST: 16 U/L (ref 0–37)
Albumin: 4.3 g/dL (ref 3.5–5.2)
Alkaline Phosphatase: 133 U/L — ABNORMAL HIGH (ref 39–117)
Bilirubin, Direct: 0 mg/dL (ref 0.0–0.3)
Total Bilirubin: 0.5 mg/dL (ref 0.2–1.2)
Total Protein: 6.6 g/dL (ref 6.0–8.3)

## 2023-06-30 LAB — LIPASE: Lipase: 22 U/L (ref 11.0–59.0)

## 2023-06-30 LAB — MAGNESIUM: Magnesium: 1.7 mg/dL (ref 1.5–2.5)

## 2023-06-30 MED ORDER — BREZTRI AEROSPHERE 160-9-4.8 MCG/ACT IN AERO: 2.0000 | INHALATION_SPRAY | Freq: Two times a day (BID) | RESPIRATORY_TRACT | 11 refills | Status: DC

## 2023-06-30 MED ORDER — PROMETHAZINE HCL 12.5 MG PO TABS: 12.5000 mg | ORAL_TABLET | Freq: Three times a day (TID) | ORAL | 1 refills | Status: DC | PRN

## 2023-06-30 MED ORDER — ZOLPIDEM TARTRATE 5 MG PO TABS: 5.0000 mg | ORAL_TABLET | Freq: Every evening | ORAL | 1 refills | Status: DC | PRN

## 2023-06-30 MED ORDER — MECLIZINE HCL 12.5 MG PO TABS: 12.5000 mg | ORAL_TABLET | Freq: Three times a day (TID) | ORAL | 1 refills | Status: DC | PRN

## 2023-06-30 MED ORDER — FLUOXETINE HCL 20 MG PO CAPS: 20.0000 mg | ORAL_CAPSULE | Freq: Every day | ORAL | 3 refills | Status: DC

## 2023-06-30 NOTE — Progress Notes (Signed)
The test results show that your current treatment is OK, as the tests are stable.  Please continue the same plan.  There is no other need for change of treatment or further evaluation based on these results, at this time.  thanks

## 2023-06-30 NOTE — Progress Notes (Unsigned)
Patient ID: Lori [name], female   DOB: 1955-12-22, 67 y.o.   MRN: [id number]        Chief Complaint: follow up copd, nausea vomiting, anxiety, vertigo, insomnia       HPI:  Lori [name] is a 67 y.o. female here overall improved but still some cough sob and copd - ran out of breztri, asks for restart.  Pt denies chest pain, increased sob or doe, wheezing, orthopnea, PND, increased LE swelling, palpitations, or syncope, but has also vertigo ongoing asking for meclizine refill..   Pt denies polydipsia, polyuria, or new focal neuro s/s.    Pt denies fever, wt loss, night sweats, loss of appetite, or other constitutional symptoms  Still smoking, not ready to quit. Denies worsening depressive symptoms, suicidal ideation, or panic; has ongoing anxiety, worsening recently.     Wt Readings from Last 3 Encounters:  06/30/23 114 lb 12.8 oz (52.1 kg)  06/06/23 117 lb 6.4 oz (53.3 kg)  05/13/23 118 lb (53.5 kg)   BP Readings from Last 3 Encounters:  06/30/23 110/80  06/06/23 (!) 140/90  05/19/23 131/68         Past Medical History:  Diagnosis Date   Allergic rhinitis    Anemia    hgb 13.9 on 11/22/21   Ankle fracture 05/2016   Anxiety    Arthritis    Asthma    Barrett esophagus 2007   Chest pain    Hx, no current problems as of 11/22/21.  Recurrent episodes.  Has had 3 negative nuclear stress test, and a completely normal coronary CT angiogram   COPD (chronic obstructive pulmonary disease) with chronic bronchitis (HCC)    & Emphysema   Depression    Duodenitis without mention of hemorrhage 2007   Esophageal reflux 2007   Esophageal stricture    Full dentures    patient does not wear dentures as of 11/22/21   H/O hiatal hernia    Hiatal hernia 2006,2007   HSV infection    Hyperlipidemia    rx crestor but pt not taking   Multiple fractures    from falls, fx rt. elbow, fx left wrist, bilateral ankles   Pneumonia 10/2016   Restrictive lung disease 06/30/2017   Ulcer    no current  problems as of 11/22/21   Past Surgical History:  Procedure Laterality Date   ABDOMINAL HYSTERECTOMY     BSO   APPENDECTOMY     BIOPSY  04/04/2022   Procedure: BIOPSY;  Surgeon: Lemar Lofty., MD;  Location: [facility] ENDOSCOPY;  Service: Gastroenterology;;   CESAREAN SECTION     x 2   CHOLECYSTECTOMY     CHONDROPLASTY Left 01/06/2015   Procedure: CHONDROPLASTY;  Surgeon: Eldred Manges, MD;  Location: [facility];  Service: Orthopedics;  Laterality: Left;   COLONOSCOPY     CT CTA CORONARY W/CA SCORE W/CM &/OR WO/CM  12/02/2016   CORONARY CALCIUM SCORE 0.  NO EVIDENCE OF CAD.  Normal-sized PA.  Normal pulmonary venous drainage the left atrium.  No ASD or VSD.   ELBOW FRACTURE SURGERY     right   ESOPHAGOGASTRODUODENOSCOPY (EGD) WITH PROPOFOL N/A 04/04/2022   Procedure: ESOPHAGOGASTRODUODENOSCOPY (EGD) WITH PROPOFOL;  Surgeon: Meridee Score Netty Starring., MD;  Location: [facility] ENDOSCOPY;  Service: Gastroenterology;  Laterality: N/A;   EUS N/A 04/04/2022   Procedure: UPPER ENDOSCOPIC ULTRASOUND (EUS) RADIAL;  Surgeon: Lemar Lofty., MD;  Location: [facility] ENDOSCOPY;  Service: Gastroenterology;  Laterality: N/A;   EYE  SURGERY Bilateral    cataracts removed   INSERTION OF MESH N/A 11/12/2022   Procedure: INSERTION OF MESH;  Surgeon: Axel Filler, MD;  Location: [facility] OR;  Service: General;  Laterality: N/A;   KNEE ARTHROSCOPY WITH EXCISION PLICA Left 01/06/2015   Procedure: KNEE ARTHROSCOPY WITH EXCISION PLICA;  Surgeon: Eldred Manges, MD;  Location: [facility];  Service: Orthopedics;  Laterality: Left;   LIGAMENT REPAIR Right 11/28/2021   Procedure: RIGHT THUMB LIGAMENT RECONSTRUCTION AND TENDON INTERPOSITION;  Surgeon: Marlyne Beards, MD;  Location: [facility] OR;  Service: Orthopedics;  Laterality: Right;   Lower Extremity Arterial Dopplers  07/06/2013   RABI 1.0, LABI 1.1.; No evidence of significant vascular atherogenic plaque   NECK SURGERY     NM MYOVIEW LTD   09/2014   a) LOW RISK. Normal EF of 65%, no RWMA. NO ISCHEMIA OR INFARCTION.;; b) 05/02/2009: Normal study.  LOW RISK.  No ischemia or infarction.  EF 74%.  Breast attenuation noted.   TONSILLECTOMY     TRANSTHORACIC ECHOCARDIOGRAM  03/28/2021   a) Normal EF 60 to 65%.  GR 1 DD.  No R WMA.  Normal RV size and function.  Normal RAP.  Normal valves. => NORMAL;; b) 04/23/16/2010: Normal study.  LOW RISK.  No ischemia or infarction.  EF 74%.  Breast attenuation noted.17/2010: (SHVC) EF>55%.  Normal wall motion.  Normal valves.   WRIST FRACTURE SURGERY     left, has plate   XI ROBOTIC ASSISTED PARAESOPHAGEAL HERNIA REPAIR N/A 11/12/2022   Procedure: ROBOTIC DIAPHRAGMATIC HERNIA REPAIR WITH MESH;  Surgeon: Axel Filler, MD;  Location: [facility] OR;  Service: General;  Laterality: N/A;    reports that she has been smoking cigarettes. She started smoking about 45 years ago. She has a 23 pack-year smoking history. She has never used smokeless tobacco. She reports that she does not drink alcohol and does not use drugs. family history includes Dementia in her mother; Diabetes in her maternal aunt, maternal grandmother, and another family member; Emphysema in her father; Heart disease in her brother. Allergies  Allergen Reactions   Advil [Ibuprofen] Nausea And Vomiting   Bee Venom Swelling   Codeine Nausea Only and Other (See Comments)    CAUSES ULCERS   Clonidine Derivatives Other (See Comments)    Unknown    Doxycycline Other (See Comments)    nausea   Statins Other (See Comments)    unknown   Latex Rash   Levaquin [Levofloxacin] Nausea Only   Propoxyphene N-Acetaminophen Nausea Only   Current Outpatient Medications on File Prior to Visit  Medication Sig Dispense Refill   albuterol (PROAIR HFA) 108 (90 Base) MCG/ACT inhaler Inhale 2 puffs into the lungs every 6 (six) hours as needed for wheezing or shortness of breath. 1 each 2   ALPRAZolam (XANAX) 1 MG tablet TAKE 1 TABLET BY MOUTH AT BEDTIME AS  NEEDED FOR ANXIETY 90 tablet 1   Benralizumab (FASENRA) 30 MG/ML SOSY INJECT 30MG  SUBCUTANEOUSLY  EVERY 8 WEEKS 1 mL 6   cyclobenzaprine (FLEXERIL) 5 MG tablet Take 1 tablet (5 mg total) by mouth 3 (three) times daily as needed for muscle spasms. 40 tablet 2   dexlansoprazole (DEXILANT) 60 MG capsule Take 1 capsule (60 mg total) by mouth daily. 90 capsule 3   EPINEPHrine 0.3 mg/0.3 mL IJ SOAJ injection INJECT AS DIRECTED FOR SEVERE ALLERGIC REACTION (Patient taking differently: Inject 0.3 mg into the muscle as needed for anaphylaxis (See Instructions). INJECT AS DIRECTED FOR SEVERE  ALLERGIC REACTION) 2 each 1   Erenumab-aooe 140 MG/ML SOAJ Inject 140 mg into the skin every 30 (thirty) days.     gabapentin (NEURONTIN) 600 MG tablet Take 600 mg by mouth 2 (two) times daily.     montelukast (SINGULAIR) 10 MG tablet Take 1 tablet (10 mg total) by mouth at bedtime. 90 tablet 3   nitroGLYCERIN (NITROSTAT) 0.4 MG SL tablet Place 1 tablet (0.4 mg total) under the tongue every 5 (five) minutes as needed for chest pain. 25 tablet 3   traMADol (ULTRAM) 50 MG tablet Take 1 tablet (50 mg total) by mouth every 6 (six) hours as needed. 30 tablet 0   valACYclovir (VALTREX) 1000 MG tablet Take 1 tablet by mouth twice daily 180 tablet 0   Current Facility-Administered Medications on File Prior to Visit  Medication Dose Route Frequency Provider Last Rate Last Admin   Benralizumab SOSY 30 mg  30 mg Subcutaneous Q8 weeks Alfonse Spruce, MD   30 mg at 06/27/23 0903        ROS:  All others reviewed and negative.  Objective        PE:  BP 110/80 (BP Location: Left Arm, Patient Position: Sitting, Cuff Size: Normal)   Pulse 68   Temp 98.5 F (36.9 C) (Oral)   Ht 5\' 4"  (1.626 m)   Wt 114 lb 12.8 oz (52.1 kg)   SpO2 96%   BMI 19.71 kg/m                 Constitutional: Pt appears in NAD               HENT: Head: NCAT.                Right Ear: External ear normal.                 Left Ear: External ear  normal.                Eyes: . Pupils are equal, round, and reactive to light. Conjunctivae and EOM are normal               Nose: without d/c or deformity               Neck: Neck supple. Gross normal ROM               Cardiovascular: Normal rate and regular rhythm.                 Pulmonary/Chest: Effort normal and breath sounds without rales or wheezing.                Abd:  Soft, NT, ND, + BS, no organomegaly               Neurological: Pt is alert. At baseline orientation, motor grossly intact               Skin: Skin is warm. No rashes, no other new lesions, LE edema - none               Psychiatric: Pt behavior is normal without agitation, mod to severe nervous  Micro: none  Cardiac tracings I have personally interpreted today:  none  Pertinent Radiological findings (summarize): none   Lab Results  Component Value Date   WBC 9.1 06/30/2023   HGB 13.5 06/30/2023   HCT 40.2 06/30/2023   PLT 279.0 06/30/2023   GLUCOSE 105 (H) 06/30/2023   CHOL  286 (H) 04/09/2023   TRIG (H) 04/09/2023    549.0 Triglyceride is over 400; calculations on Lipids are invalid.   HDL 36.10 (L) 04/09/2023   LDLDIRECT 178.0 04/09/2023   LDLCALC Comment 03/12/2017   ALT 24 06/30/2023   AST 16 06/30/2023   NA 136 06/30/2023   K 3.7 06/30/2023   CL 102 06/30/2023   CREATININE 1.10 06/30/2023   BUN 14 06/30/2023   CO2 26 06/30/2023   TSH 1.42 01/10/2022   INR 0.9 08/31/2008   HGBA1C 5.3 04/09/2023   Assessment/Plan:  Lori [name] is a 67 y.o. White or Caucasian [1] female with  has a past medical history of Allergic rhinitis, Anemia, Ankle fracture (05/2016), Anxiety, Arthritis, Asthma, Barrett esophagus (2007), Chest pain, COPD (chronic obstructive pulmonary disease) with chronic bronchitis (HCC), Depression, Duodenitis without mention of hemorrhage (2007), Esophageal reflux (2007), Esophageal stricture, Full dentures, H/O hiatal hernia, Hiatal hernia (1610,9604), HSV infection, Hyperlipidemia,  Multiple fractures, Pneumonia (10/2016), Restrictive lung disease (06/30/2017), and Ulcer.  Asthma-COPD overlap syndrome (HCC) Mild to mod, for restart breztri bid,  to f/u any worsening symptoms or concerns  Cigarette smoker Pt counseled to quit, pt not ready  Vitamin D deficiency Last vitamin D Lab Results  Component Value Date   VD25OH 13.50 (L) 04/09/2023   Low, to start  oral replacement   Vertigo Mild recurring, for meclizien prn  Anxiety state Mod to severe, for restart prozac 20 qd,  to f/u any worsening symptoms or concerns  Followup: No follow-ups on file.  Oliver Barre, MD 07/02/2023 9:52 PM [facility] [place] [facility]

## 2023-06-30 NOTE — Patient Instructions (Signed)
Please take all new medication as prescribed - the breztri, and the prozac 20 mg per day  Please continue all other medications as before, and refills have been done as requested.  Please have the pharmacy call with any other refills you may need.  Please continue your efforts at being more active, low cholesterol diet, and weight control.  Please keep your appointments with your specialists as you may have planned  Please go to the LAB at the blood drawing area for the tests to be done  You will be contacted by phone if any changes need to be made immediately.  Otherwise, you will receive a letter about your results with an explanation, but please check with MyChart first.  Please make an Appointment to return in 6 months, or sooner if needed

## 2023-07-02 ENCOUNTER — Encounter: Payer: Self-pay | Admitting: Internal Medicine

## 2023-07-02 NOTE — Assessment & Plan Note (Signed)
Pt counseled to quit, pt not ready

## 2023-07-02 NOTE — Assessment & Plan Note (Signed)
Mod to severe, for restart prozac 20 qd,  to f/u any worsening symptoms or concerns

## 2023-07-02 NOTE — Assessment & Plan Note (Signed)
Mild to mod, for restart breztri bid,  to f/u any worsening symptoms or concerns

## 2023-07-02 NOTE — Assessment & Plan Note (Signed)
Mild recurring, for meclizien prn

## 2023-07-02 NOTE — Assessment & Plan Note (Signed)
Last vitamin D Lab Results  Component Value Date   VD25OH 13.50 (L) 04/09/2023   Low, to start  oral replacement

## 2023-07-07 ENCOUNTER — Ambulatory Visit: Payer: Self-pay

## 2023-07-07 DIAGNOSIS — J441 Chronic obstructive pulmonary disease with (acute) exacerbation: Secondary | ICD-10-CM

## 2023-07-07 NOTE — Patient Outreach (Signed)
Care Coordination   Follow Up Visit Note   07/07/2023 Name: Lori [name] MRN: [id number] DOB: Feb 19, 1956  Lori [name] is a 67 y.o. year old female who sees Lori Levins, MD for primary care. I spoke with  Lori [name] by phone today.  What matters to the patients health and wellness today?  Patient expresses stress/grief/anxiety. Reports recently had to put her dog down, also reports stress related to family concerns. Patient reports she is trying to get [facility] service assistance. Medications discussed. Patient reports she received naloxone from pharmacy visit. Questions why she was given this and when it would be used. Receptive to review of medications from clinical pharmacist.   Goals Addressed             This Visit's Progress    continue to improve post hospitalization       Interventions Today    Flowsheet Row Most Recent Value  Chronic Disease   Chronic disease during today's visit Chronic Obstructive Pulmonary Disease (COPD)  General Interventions   General Interventions Discussed/Reviewed General Interventions Reviewed  [Evaluation of current treatment plan for health condition and patient's adherence to plan.]  Education Interventions   Education Provided Provided Education  Provided Verbal Education On Medication, When to see the doctor, Other  [advised to contact provider with health questions or concerns, take medications as prescribed, attend provider visits as scheduled]  Mental Health Interventions   Mental Health Discussed/Reviewed Mental Health Discussed, Refer to Social Work for counseling, Anxiety, Grief and Loss  Refer to Social Work for counseling regarding Anxiety/Coping, Grief and Loss  Pharmacy Interventions   Pharmacy Dicussed/Reviewed Pharmacy Topics Reviewed, Referral to Pharmacist  Referral to Pharmacist Drug interaction/side effects  [medication review indications, obtained naloxone recently and uncertain of when to use.]            SDOH  assessments and interventions completed:  No  Care Coordination Interventions:  Yes, provided   Follow up plan: Follow up call scheduled for 08/05/23    Encounter Outcome:  Patient Visit Completed   Lori Sheriff, RN, MSN, BSN, CCM Care Management Coordinator [phone]

## 2023-07-07 NOTE — Patient Instructions (Signed)
Visit Information  Thank you for taking time to visit with me today. Please don't hesitate to contact me if I can be of assistance to you.   Following are the goals we discussed today:  Continue to take medications as prescribed. Continue to attend provider visits as scheduled Continue to eat healthy, lean meats, vegetables, fruits, avoid saturated and transfats Contact provider for questions/concerns regarding health condition as needed   Our next appointment is by telephone on 08/05/23 at 10:30 am  Please call the care guide team at [phone] if you need to cancel or reschedule your appointment.   If you are experiencing a Mental Health or Behavioral Health Crisis or need someone to talk to, please call the Suicide and Crisis Lifeline: 988 call the [place] National Suicide Prevention Lifeline: 564-736-3338 or TTY: 231 477 2450 TTY 580-323-7622) to talk to a trained counselor call 1-800-273-TALK (toll free, 24 hour hotline)  Kathyrn Sheriff, RN, MSN, BSN, CCM Care Management Coordinator [phone]

## 2023-07-10 ENCOUNTER — Telehealth: Payer: Self-pay | Admitting: Licensed Clinical Social Worker

## 2023-07-10 ENCOUNTER — Telehealth: Payer: Self-pay

## 2023-07-10 ENCOUNTER — Encounter: Payer: Self-pay | Admitting: Licensed Clinical Social Worker

## 2023-07-10 NOTE — Patient Outreach (Signed)
Care Coordination   07/10/2023 Name: Lori [name] MRN: [id number] DOB: December 05, 1955   Care Coordination Outreach Attempts:  An unsuccessful telephone outreach was attempted for a scheduled appointment today.  Follow Up Plan:  Additional outreach attempts will be made to offer the patient care coordination information and services.   Encounter Outcome:  No Answer   Care Coordination Interventions:  No, not indicated    Jenel Lucks, MSW, LCSW [facility] Care Management [place]  [facility] [email or web address] Phone [phone] 4:19 PM

## 2023-07-10 NOTE — Telephone Encounter (Signed)
Signed by provider;  Left message for patient to call back withi information on where she wants documents sent or if she was coming to pick it up.

## 2023-07-10 NOTE — Telephone Encounter (Signed)
Attestation of Medical received from patient .  Paperwork completed, awaiting provider signature to contact patient once completed.

## 2023-07-11 NOTE — Addendum Note (Signed)
Addended by: Colletta Maryland on: 07/11/2023 10:59 AM   Modules accepted: Orders

## 2023-07-11 NOTE — Telephone Encounter (Signed)
Pt called back and ask can the forms be placed in the mail because she don't have any way to come pick them up.Please advise.

## 2023-07-17 ENCOUNTER — Telehealth: Payer: Self-pay

## 2023-07-17 IMAGING — DX DG CHEST 2V
2 series · 2 of 2 positions shown · non-contrast
Comparison: Chest x-ray 03/28/2021.

CLINICAL DATA: 64-year-old female with history of recent pulmonary
embolism. COPD.

EXAM:
CHEST - 2 VIEW

[chest pa]
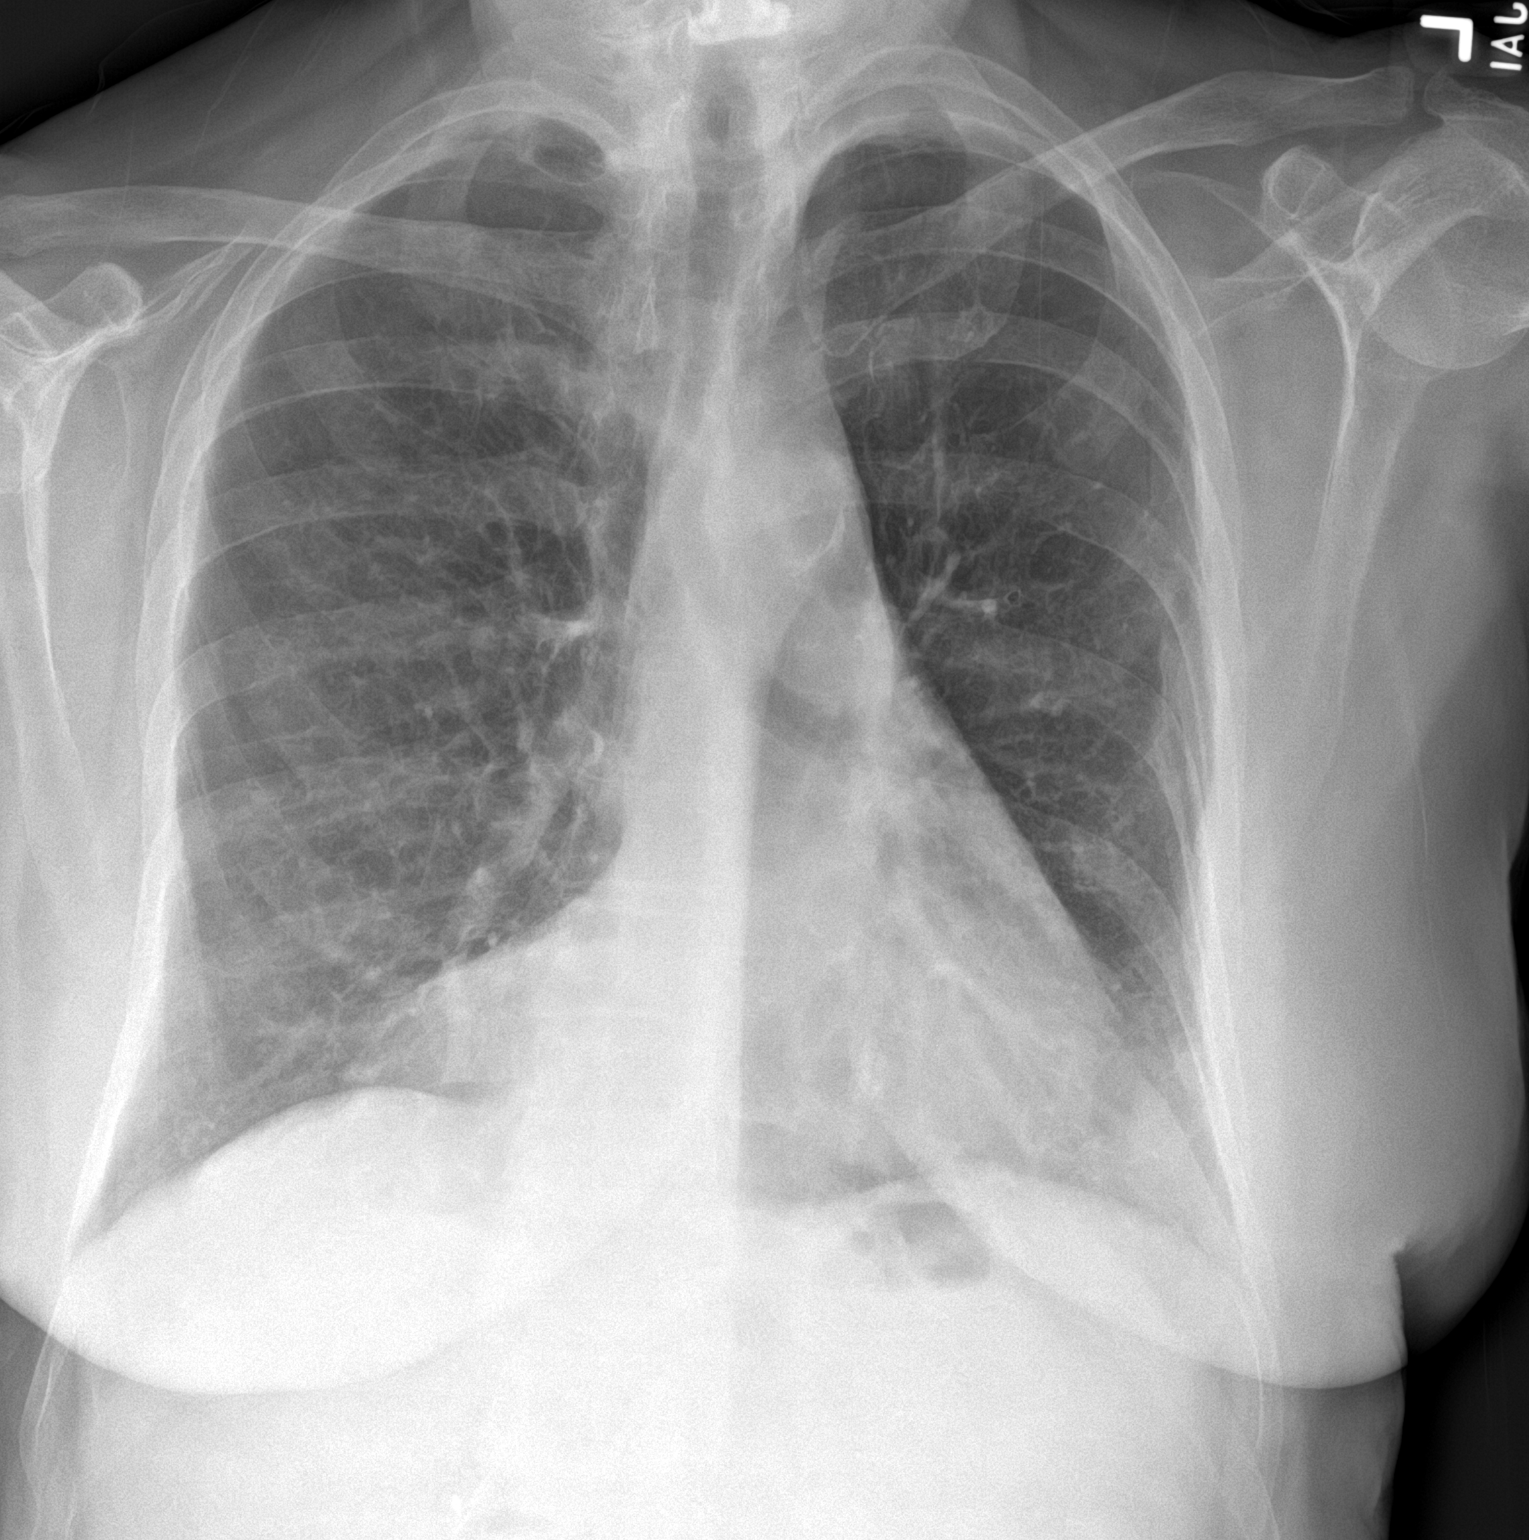

[chest lat]
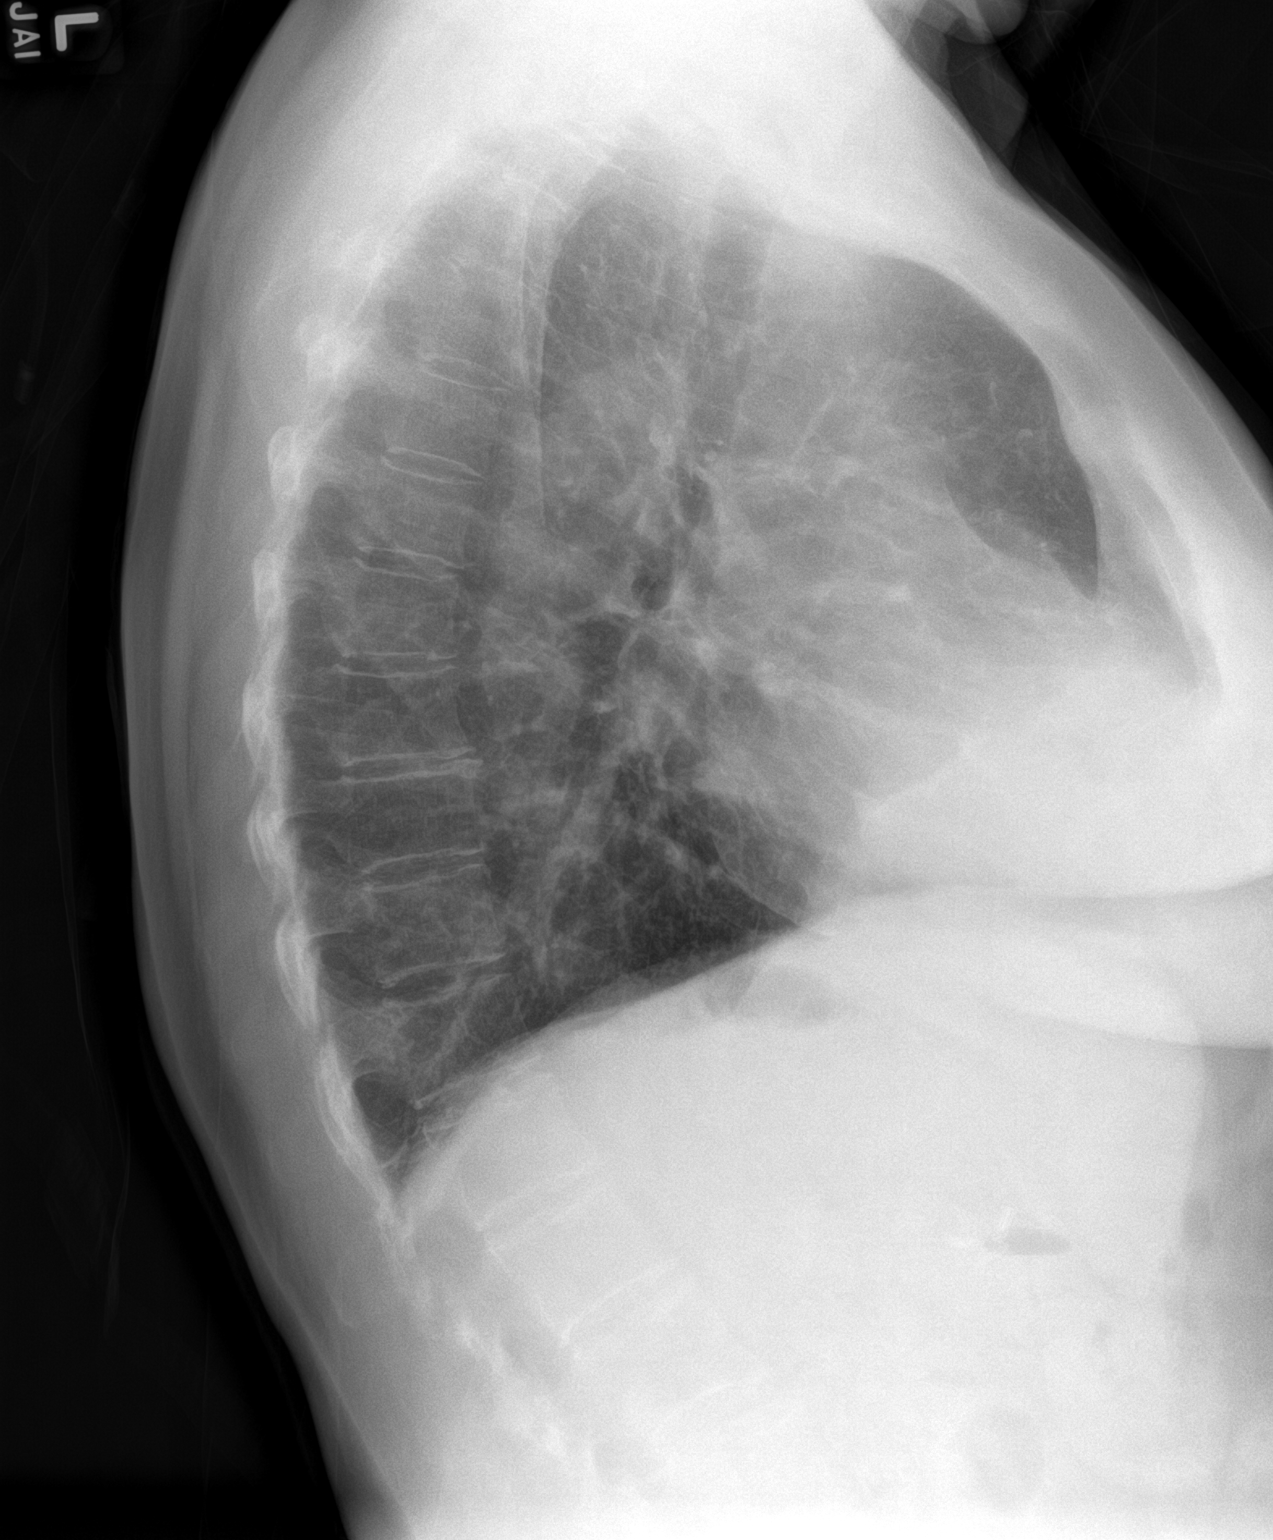

[2 of 2 positions shown; findings below may reference images not displayed]

FINDINGS: Lung volumes are normal. No consolidative airspace disease. No
pleural effusions. No pneumothorax. No evidence of pulmonary edema.
Heart size is normal. Unusual contour adjacent to the right side of
the heart corresponding to a fat containing Morgagni hernia better
demonstrated on prior CT the abdomen and pelvis 11/14/2017. Upper
mediastinal contours are otherwise within normal limits.
Atherosclerotic calcifications in the thoracic aorta.
IMPRESSION: 1. No radiographic evidence of acute cardiopulmonary disease.
2. Aortic atherosclerosis.

## 2023-07-17 NOTE — Progress Notes (Signed)
Care Guide Note  07/17/2023 Name: Lori [name] MRN: [id number] DOB: 05-16-1956  Referred by: Corwin Levins, MD Reason for referral : Care Coordination (Outreach to schedule with Pharm d )   Lori [name] is a 67 y.o. year old female who is a primary care patient of Corwin Levins, MD. Lori [name] was referred to the pharmacist for assistance related to COPD.    An unsuccessful telephone outreach was attempted today to contact the patient who was referred to the pharmacy team for assistance with medication management. Additional attempts will be made to contact the patient.   Penne Lash, RMA Care Guide [facility]  [place], [place] [address] Direct Dial: [phone] [email or web address]

## 2023-07-25 NOTE — Progress Notes (Signed)
   Care Guide Note  07/25/2023 Name: TAMEAH LURRY MRN: [id number] DOB: April 17, 1956  Referred by: Corwin Levins, MD Reason for referral : Care Coordination (Outreach to schedule with Pharm d )   AIREN GOLDRICK is a 67 y.o. year old female who is a primary care patient of Corwin Levins, MD. ELAINEA TERPENING was referred to the pharmacist for assistance related to COPD.    Successful contact was made with the patient to discuss pharmacy services. Patient declines engagement at this time. Contact information was provided to the patient should they wish to reach out for assistance at a later time.  Penne Lash, RMA Care Guide [facility]  [place], [place] [address] Direct Dial: [phone] [email or web address]

## 2023-07-29 ENCOUNTER — Ambulatory Visit: Payer: 59 | Admitting: Allergy & Immunology

## 2023-07-31 ENCOUNTER — Ambulatory Visit: Payer: 59 | Admitting: Internal Medicine

## 2023-08-01 NOTE — Telephone Encounter (Signed)
Forms placed in mail to patient

## 2023-08-04 ENCOUNTER — Ambulatory Visit (INDEPENDENT_AMBULATORY_CARE_PROVIDER_SITE_OTHER): Payer: 59 | Admitting: *Deleted

## 2023-08-04 ENCOUNTER — Other Ambulatory Visit: Payer: Self-pay | Admitting: *Deleted

## 2023-08-04 DIAGNOSIS — J309 Allergic rhinitis, unspecified: Secondary | ICD-10-CM | POA: Diagnosis not present

## 2023-08-04 MED ORDER — EPINEPHRINE 0.3 MG/0.3ML IJ SOAJ: 0.3000 mg | INTRAMUSCULAR | 1 refills | Status: DC | PRN

## 2023-08-05 ENCOUNTER — Telehealth: Payer: Self-pay

## 2023-08-05 NOTE — Patient Outreach (Signed)
  Care Coordination   08/05/2023 Name: BAE KUKURA MRN: [id number] DOB: September 19, 1955   Care Coordination Outreach Attempts:  An unsuccessful telephone outreach was attempted for a scheduled appointment today.  Follow Up Plan:  Additional outreach attempts will be made to offer the patient care coordination information and services.   Encounter Outcome:  No Answer   Care Coordination Interventions:  No, not indicated    Kathyrn Sheriff, RN, MSN, BSN, CCM Care Management Coordinator [phone]

## 2023-08-12 ENCOUNTER — Telehealth: Payer: Self-pay | Admitting: Allergy & Immunology

## 2023-08-12 NOTE — Telephone Encounter (Signed)
Left voicemail to give the office a call back to schedule Lori [name] reapproval appointment before 12/31.

## 2023-08-22 ENCOUNTER — Ambulatory Visit: Payer: 59

## 2023-09-01 ENCOUNTER — Telehealth: Payer: Self-pay | Admitting: Internal Medicine

## 2023-09-01 NOTE — Telephone Encounter (Signed)
Refill too soon , Pt needs to contact pharmacy for refill.

## 2023-09-01 NOTE — Telephone Encounter (Signed)
Prescription Request  09/01/2023  LOV: 06/30/2023  What is the name of the medication or equipment? fluoxetine  Have you contacted your pharmacy to request a refill? Yes   Which pharmacy would you like this sent to?  [facility] [address] [place], [place] - [address] [place] [address] Phone: [phone] Fax: [phone]    Patient notified that their request is being sent to the clinical staff for review and that they should receive a response within 2 business days.   Please advise at Mobile [phone] (mobile)

## 2023-09-02 ENCOUNTER — Ambulatory Visit (INDEPENDENT_AMBULATORY_CARE_PROVIDER_SITE_OTHER): Payer: 59 | Admitting: *Deleted

## 2023-09-02 DIAGNOSIS — J4550 Severe persistent asthma, uncomplicated: Secondary | ICD-10-CM | POA: Diagnosis not present

## 2023-09-04 ENCOUNTER — Ambulatory Visit (INDEPENDENT_AMBULATORY_CARE_PROVIDER_SITE_OTHER): Payer: 59 | Admitting: *Deleted

## 2023-09-04 DIAGNOSIS — J309 Allergic rhinitis, unspecified: Secondary | ICD-10-CM

## 2023-09-16 ENCOUNTER — Telehealth: Payer: Self-pay

## 2023-09-16 ENCOUNTER — Other Ambulatory Visit: Payer: Self-pay | Admitting: Internal Medicine

## 2023-09-16 DIAGNOSIS — G43719 Chronic migraine without aura, intractable, without status migrainosus: Secondary | ICD-10-CM | POA: Diagnosis not present

## 2023-09-16 NOTE — Telephone Encounter (Signed)
 Script has already been faxed in today.   Copied from CRM [id number]. Topic: Clinical - Medication Refill >> Sep 16, 2023  1:20 PM Isabell A wrote: Most Recent Primary Care Visit:  Provider: NORLEEN LYNWOOD ORN  Department: [facility] [place]  Visit Type: OFFICE VISIT  Date: 06/30/2023  Medication: valACYclovir  (VALTREX ) 1000 MG tablet   Has the patient contacted their pharmacy? Yes (Agent: If no, request that the patient contact the pharmacy for the refill. If patient does not wish to contact the pharmacy document the reason why and proceed with request.) (Agent: If yes, when and what did the pharmacy advise?)  Is this the correct pharmacy for this prescription? Yes If no, delete pharmacy and type the correct one.  This is the patient's preferred pharmacy:  [facility] [address] [place] - [address] [place] [address] Phone: [phone] Fax: [phone]   Has the prescription been filled recently? Yes  Is the patient out of the medication? Yes  Has the patient been seen for an appointment in the last year OR does the patient have an upcoming appointment? Yes  Can we respond through MyChart? No  Agent: Please be advised that Rx refills may take up to 3 business days. We ask that you follow-up with your pharmacy.

## 2023-09-16 NOTE — Telephone Encounter (Signed)
 Copied from CRM [id number]. Topic: Clinical - Medication Refill >> Sep 16, 2023  1:20 PM Isabell A wrote: Most Recent Primary Care Visit:  Provider: NORLEEN LYNWOOD ORN  Department: [facility] [place]  Visit Type: OFFICE VISIT  Date: 06/30/2023  Medication: ***  Has the patient contacted their pharmacy?  (Agent: If no, request that the patient contact the pharmacy for the refill. If patient does not wish to contact the pharmacy document the reason why and proceed with request.) (Agent: If yes, when and what did the pharmacy advise?)  Is this the correct pharmacy for this prescription?  If no, delete pharmacy and type the correct one.  This is the patient's preferred pharmacy:  [facility] [address], [place] - [address] [place] [address] Phone: [phone] Fax: [phone]   Has the prescription been filled recently?   Is the patient out of the medication?   Has the patient been seen for an appointment in the last year OR does the patient have an upcoming appointment?   Can we respond through MyChart?   Agent: Please be advised that Rx refills may take up to 3 business days. We ask that you follow-up with your pharmacy.

## 2023-09-16 NOTE — Telephone Encounter (Signed)
Done erx 

## 2023-09-19 ENCOUNTER — Ambulatory Visit (INDEPENDENT_AMBULATORY_CARE_PROVIDER_SITE_OTHER): Payer: 59 | Admitting: Family Medicine

## 2023-09-19 ENCOUNTER — Encounter: Payer: Self-pay | Admitting: Family Medicine

## 2023-09-19 VITALS — BP 120/86 | Ht 64.0 in | Wt 114.0 lb

## 2023-09-19 DIAGNOSIS — B372 Candidiasis of skin and nail: Secondary | ICD-10-CM

## 2023-09-19 DIAGNOSIS — R300 Dysuria: Secondary | ICD-10-CM | POA: Diagnosis not present

## 2023-09-19 DIAGNOSIS — B009 Herpesviral infection, unspecified: Secondary | ICD-10-CM

## 2023-09-19 DIAGNOSIS — M797 Fibromyalgia: Secondary | ICD-10-CM | POA: Diagnosis not present

## 2023-09-19 DIAGNOSIS — R102 Pelvic and perineal pain: Secondary | ICD-10-CM | POA: Diagnosis not present

## 2023-09-19 LAB — POCT URINALYSIS DIPSTICK
Bilirubin, UA: NEGATIVE
Blood, UA: NEGATIVE
Glucose, UA: NEGATIVE
Ketones, UA: NEGATIVE
Leukocytes, UA: NEGATIVE
Nitrite, UA: NEGATIVE
Protein, UA: NEGATIVE
Spec Grav, UA: 1.015 (ref 1.010–1.025)
Urobilinogen, UA: NEGATIVE U/dL — AB
pH, UA: 6 (ref 5.0–8.0)

## 2023-09-19 MED ORDER — VALACYCLOVIR HCL 500 MG PO TABS: 500.0000 mg | ORAL_TABLET | Freq: Two times a day (BID) | ORAL | 2 refills | Status: DC

## 2023-09-19 MED ORDER — TRAMADOL HCL 50 MG PO TABS: 50.0000 mg | ORAL_TABLET | Freq: Four times a day (QID) | ORAL | 0 refills | Status: DC | PRN

## 2023-09-19 MED ORDER — NYSTATIN 100000 UNIT/GM EX CREA: 1.0000 | TOPICAL_CREAM | Freq: Two times a day (BID) | CUTANEOUS | 0 refills | Status: DC

## 2023-09-19 NOTE — Patient Instructions (Addendum)
 Take one tablet of valtrex  twice a day for 7 days.  May apply cream topically to the vagina twice a day as needed for yeast.  Your urine has been sent it to the lab for culture and confirmation.   Tramadol  has been refilled for pain.  Follow-up with me for new or worsening symptoms.

## 2023-09-19 NOTE — Progress Notes (Signed)
 Acute Office Visit  Subjective:     Patient ID: Lori [name], female    DOB: 1956/09/03, 68 y.o.   MRN: [id number]  Chief Complaint  Patient presents with   Vaginal Pain    Patient noted knot found on their left labia as well as burning/pain when urinating for 2-3 weeks.     HPI Patient is in today for burning with urination for the last few days.  Unable to give any more detailed timeframe. Reports pain after urination as well. Denies hematuria, urinary urgency or frequency. Has history of HSV.  Reports that the skin of her vagina feels very overall.  Denies any vaginal discharge, bleeding. Has not been attempting to treat at home. Denies nausea, vomiting, diarrhea, rash, fever, chills, other symptoms. Medical history as outlined below.  Separately requesting refill of tramadol  for chronic pain, has fibromyalgia.  ROS Per HPI      Objective:    BP 120/86   Ht 5' 4 (1.626 m)   Wt 114 lb (51.7 kg)   BMI 19.57 kg/m    Physical Exam Vitals and nursing note reviewed. Exam conducted with a chaperone present (Accompanied by friend, present for exam).  Constitutional:      Appearance: Normal appearance. She is normal weight.  HENT:     Head: Normocephalic and atraumatic.     Right Ear: Tympanic membrane and ear canal normal.     Left Ear: Tympanic membrane and ear canal normal.     Nose: Nose normal.  Eyes:     Extraocular Movements: Extraocular movements intact.     Pupils: Pupils are equal, round, and reactive to light.  Cardiovascular:     Rate and Rhythm: Normal rate and regular rhythm.     Heart sounds: Normal heart sounds.  Pulmonary:     Effort: Pulmonary effort is normal.     Breath sounds: Normal breath sounds.  Genitourinary:      Comments: Vesicular lesion to clitoris, very tender, no bleeding, no discharge Area outlined in red is erythematous, very tender to touch, swollen.  White discharge noted on exam.  No bleeding noted.  No satellite  lesions noted. Musculoskeletal:        General: Normal range of motion.     Cervical back: Normal range of motion.  Neurological:     General: No focal deficit present.     Mental Status: She is alert and oriented to person, place, and time.  Psychiatric:        Mood and Affect: Mood normal.        Thought Content: Thought content normal.    Results for orders placed or performed in visit on 09/19/23  POCT urinalysis dipstick  Result Value Ref Range   Color, UA     Clarity, UA     Glucose, UA Negative Negative   Bilirubin, UA negative    Ketones, UA negative    Spec Grav, UA 1.015 1.010 - 1.025   Blood, UA negative    pH, UA 6.0 5.0 - 8.0   Protein, UA Negative Negative   Urobilinogen, UA negative (A) 0.2 or 1.0 E.U./dL   Nitrite, UA negative    Leukocytes, UA Negative Negative   Appearance     Odor          Assessment & Plan:  1. Vaginal pain (Primary)  - Urine Culture; Future  2. Dysuria  - POCT urinalysis dipstick - Urine Culture; Future  3. HSV infection  -  valACYclovir  (VALTREX ) 500 MG tablet; Take 1 tablet (500 mg total) by mouth 2 (two) times daily.  Dispense: 14 tablet; Refill: 2  4. Yeast dermatitis  - nystatin  cream (MYCOSTATIN ); Apply 1 Application topically 2 (two) times daily.  Dispense: 60 g; Refill: 0 - Urine Culture; Future  5. Fibromyalgia  - traMADol  (ULTRAM ) 50 MG tablet; Take 1 tablet (50 mg total) by mouth every 6 (six) hours as needed.  Dispense: 30 tablet; Refill: 0   Meds ordered this encounter  Medications   nystatin  cream (MYCOSTATIN )    Sig: Apply 1 Application topically 2 (two) times daily.    Dispense:  60 g    Refill:  0   valACYclovir  (VALTREX ) 500 MG tablet    Sig: Take 1 tablet (500 mg total) by mouth 2 (two) times daily.    Dispense:  14 tablet    Refill:  2   traMADol  (ULTRAM ) 50 MG tablet    Sig: Take 1 tablet (50 mg total) by mouth every 6 (six) hours as needed.    Dispense:  30 tablet    Refill:  0     Return if symptoms worsen or fail to improve.  Corean Ku, FNP

## 2023-09-20 ENCOUNTER — Other Ambulatory Visit: Payer: Self-pay | Admitting: Allergy & Immunology

## 2023-09-20 LAB — URINE CULTURE

## 2023-09-22 ENCOUNTER — Telehealth: Payer: Self-pay

## 2023-09-22 NOTE — Telephone Encounter (Signed)
 Called and spoke with patient regarding this matter. Patient expressed understanding.

## 2023-09-22 NOTE — Telephone Encounter (Signed)
 Copied from CRM [id number]. Topic: Clinical - Lab/Test Results >> Sep 22, 2023 10:40 AM Truddie Crumble wrote: Reason for CRM: Pt called stating she was supposed to be called regarding a urine test

## 2023-09-29 ENCOUNTER — Encounter (HOSPITAL_COMMUNITY): Payer: Self-pay

## 2023-09-29 ENCOUNTER — Emergency Department (HOSPITAL_COMMUNITY)
Admission: EM | Admit: 2023-09-29 | Discharge: 2023-09-30 | Disposition: A | Discharge status: Home / Self Care | Payer: 59 | Attending: Emergency Medicine | Admitting: Emergency Medicine

## 2023-09-29 ENCOUNTER — Other Ambulatory Visit: Payer: Self-pay

## 2023-09-29 ENCOUNTER — Ambulatory Visit (HOSPITAL_COMMUNITY): Payer: 59

## 2023-09-29 ENCOUNTER — Ambulatory Visit (HOSPITAL_COMMUNITY)
Admission: EM | Admit: 2023-09-29 | Discharge: 2023-09-29 | Disposition: A | Discharge status: Home / Self Care | Payer: 59 | Attending: Family Medicine | Admitting: Family Medicine

## 2023-09-29 ENCOUNTER — Encounter (HOSPITAL_COMMUNITY): Payer: Self-pay | Admitting: Emergency Medicine

## 2023-09-29 DIAGNOSIS — S0083XA Contusion of other part of head, initial encounter: Secondary | ICD-10-CM | POA: Diagnosis not present

## 2023-09-29 DIAGNOSIS — W01198A Fall on same level from slipping, tripping and stumbling with subsequent striking against other object, initial encounter: Secondary | ICD-10-CM | POA: Diagnosis not present

## 2023-09-29 DIAGNOSIS — Z91040 Latex allergy status: Secondary | ICD-10-CM | POA: Insufficient documentation

## 2023-09-29 DIAGNOSIS — W19XXXA Unspecified fall, initial encounter: Secondary | ICD-10-CM | POA: Diagnosis not present

## 2023-09-29 DIAGNOSIS — S161XXA Strain of muscle, fascia and tendon at neck level, initial encounter: Secondary | ICD-10-CM | POA: Diagnosis not present

## 2023-09-29 DIAGNOSIS — S2242XA Multiple fractures of ribs, left side, initial encounter for closed fracture: Secondary | ICD-10-CM | POA: Diagnosis not present

## 2023-09-29 DIAGNOSIS — I6523 Occlusion and stenosis of bilateral carotid arteries: Secondary | ICD-10-CM | POA: Diagnosis not present

## 2023-09-29 DIAGNOSIS — M25562 Pain in left knee: Secondary | ICD-10-CM | POA: Diagnosis not present

## 2023-09-29 DIAGNOSIS — Z043 Encounter for examination and observation following other accident: Secondary | ICD-10-CM | POA: Diagnosis not present

## 2023-09-29 DIAGNOSIS — S8002XA Contusion of left knee, initial encounter: Secondary | ICD-10-CM | POA: Insufficient documentation

## 2023-09-29 DIAGNOSIS — S199XXA Unspecified injury of neck, initial encounter: Secondary | ICD-10-CM | POA: Diagnosis not present

## 2023-09-29 DIAGNOSIS — S0990XA Unspecified injury of head, initial encounter: Secondary | ICD-10-CM | POA: Diagnosis not present

## 2023-09-29 DIAGNOSIS — J439 Emphysema, unspecified: Secondary | ICD-10-CM | POA: Diagnosis not present

## 2023-09-29 MED ORDER — ACETAMINOPHEN 500 MG PO TABS
1000.0000 mg | ORAL_TABLET | Freq: Once | ORAL | Status: AC
  Administered 2023-09-30: 1000 mg via ORAL
  Filled 2023-09-29: qty 2

## 2023-09-29 MED ORDER — ONDANSETRON 8 MG PO TBDP
8.0000 mg | ORAL_TABLET | Freq: Once | ORAL | Status: AC
  Administered 2023-09-30: 8 mg via ORAL
  Filled 2023-09-29: qty 1

## 2023-09-29 NOTE — Discharge Instructions (Addendum)
 Please go to the emergency department for further evaluation with CT scan and further imaging.

## 2023-09-29 NOTE — ED Notes (Signed)
 Patient is being discharged from the Urgent Care and sent to the Emergency Department via POV . Per Rumaldo Ryder NP, patient is in need of higher level of care due to fall, head injury . Patient is aware and verbalizes understanding of plan of care.  Vitals:   09/29/23 1359  BP: 119/66  Pulse: 74  Resp: 15  Temp: 98.7 F (37.1 C)  SpO2: 99%

## 2023-09-29 NOTE — ED Provider Notes (Signed)
 Patient presented after fall that occurred about 3 hours ago.  Patient states she was on her porch and tripped on her shoestring.  Patient endorses left knee pain, hip pain, rib pain, and head pain.  Patient has hematoma to left forehead.  Patient denies LOC.  Patient denies taking blood thinners.  Patient endorses dizziness at this time.  A&O x 4.  No neurodeficits noted at this time.  Recommended patient go to ER for further evaluation and CT of her head.  Patient stated that she needs to call her sister's boyfriend to come and pick her up and take her to the ER.  Offered to call EMS to take her to the ER, but patient refused at this time.  Patient understands that is important to get a scan of her head to rule out underlying injury.   Johnie Flaming A, NP 09/29/23 1442

## 2023-09-29 NOTE — ED Triage Notes (Signed)
 Pt presents with a knot on the lt side of the head and headache.   States she fell  approx 3 hours ago, she was on her porch and tripped on her shoe string. Pt reports hitting a metal piece on the porch. C/o pain on lt knee, lt hip and under her lt arm.

## 2023-09-29 NOTE — ED Triage Notes (Signed)
 Pt sent from UC, states she had a trip fall this afternoon, struck L head vs metal piece on side of a porch. +head, neck, back and L knee pain present. Denies LOC or thinners

## 2023-09-29 NOTE — ED Provider Triage Note (Signed)
 Emergency Medicine Provider Triage Evaluation Note  Lori [name] , a 68 y.o. female  was evaluated in triage.  Pt complains of fall.  Occurred yesterday.  States she was walking on her porch and tripped on a shoestring she hit her head and landed on her left knee and may have hit her left chest as well.  She is now complaining of head and neck pain, left-sided chest wall pain and left knee pain.  Denies LOC.  Denies use of blood thinners.  Review of Systems  Positive: See above Negative: See above  Physical Exam  BP (!) 122/56 (BP Location: Left Arm)   Pulse 78   Temp 98 F (36.7 C)   Resp 18   SpO2 97%  Gen:   Awake, no distress   Resp:  Normal effort  MSK:   Moves extremities without difficulty  Other:    Medical Decision Making  Medically screening exam initiated at 11:54 PM.  Appropriate orders placed.  LALITHA ILYAS was informed that the remainder of the evaluation will be completed by another provider, this initial triage assessment does not replace that evaluation, and the importance of remaining in the ED until their evaluation is complete.  Work up started   Lang Norleen POUR, PA-C 09/29/23 2355

## 2023-09-30 ENCOUNTER — Emergency Department (HOSPITAL_COMMUNITY): Payer: 59

## 2023-09-30 ENCOUNTER — Other Ambulatory Visit: Payer: Self-pay

## 2023-09-30 DIAGNOSIS — I6523 Occlusion and stenosis of bilateral carotid arteries: Secondary | ICD-10-CM | POA: Diagnosis not present

## 2023-09-30 DIAGNOSIS — J439 Emphysema, unspecified: Secondary | ICD-10-CM | POA: Diagnosis not present

## 2023-09-30 DIAGNOSIS — S161XXA Strain of muscle, fascia and tendon at neck level, initial encounter: Secondary | ICD-10-CM | POA: Diagnosis not present

## 2023-09-30 DIAGNOSIS — S0990XA Unspecified injury of head, initial encounter: Secondary | ICD-10-CM | POA: Diagnosis not present

## 2023-09-30 DIAGNOSIS — S199XXA Unspecified injury of neck, initial encounter: Secondary | ICD-10-CM | POA: Diagnosis not present

## 2023-09-30 DIAGNOSIS — Z043 Encounter for examination and observation following other accident: Secondary | ICD-10-CM | POA: Diagnosis not present

## 2023-09-30 DIAGNOSIS — S2242XA Multiple fractures of ribs, left side, initial encounter for closed fracture: Secondary | ICD-10-CM | POA: Diagnosis not present

## 2023-09-30 LAB — BASIC METABOLIC PANEL
Anion gap: 10 (ref 5–15)
BUN: 18 mg/dL (ref 8–23)
CO2: 20 mmol/L — ABNORMAL LOW (ref 22–32)
Calcium: 9.1 mg/dL (ref 8.9–10.3)
Chloride: 106 mmol/L (ref 98–111)
Creatinine, Ser: 0.95 mg/dL (ref 0.44–1.00)
GFR, Estimated: >60 mL/min (ref >60)
Glucose, Bld: 95 mg/dL (ref 70–99)
Potassium: 3.9 mmol/L (ref 3.5–5.1)
Sodium: 136 mmol/L (ref 135–145)

## 2023-09-30 LAB — CBC
HCT: 37.4 % (ref 36.0–46.0)
Hemoglobin: 12.7 g/dL (ref 12.0–15.0)
MCH: 31.8 pg (ref 26.0–34.0)
MCHC: 34 g/dL (ref 30.0–36.0)
MCV: 93.5 fL (ref 80.0–100.0)
Platelets: 298 10*3/uL (ref 150–400)
RBC: 4 MIL/uL (ref 3.87–5.11)
RDW: 13.4 % (ref 11.5–15.5)
WBC: 9.5 10*3/uL (ref 4.0–10.5)
nRBC: 0 % (ref 0.0–0.2)

## 2023-09-30 LAB — TROPONIN I (HIGH SENSITIVITY): Troponin I (High Sensitivity): <2 ng/L (ref <18)

## 2023-09-30 MED ORDER — TIZANIDINE HCL 2 MG PO TABS: 2.0000 mg | ORAL_TABLET | Freq: Three times a day (TID) | ORAL | 0 refills | Status: DC | PRN

## 2023-09-30 NOTE — ED Provider Notes (Signed)
 [place] EMERGENCY DEPARTMENT AT [facility] Provider Note   CSN: [id number] Arrival date & time: 09/29/23  2308     History  Chief Complaint  Patient presents with   Lori [name] is a 68 y.o. female.  The history is provided by the patient.  Fall  Lori [name] is a 68 y.o. female who presents to the Emergency Department complaining of fall.  She presents to the emergency department for evaluation of injuries following a fall that occurred yesterday afternoon.  She states that she tripped over her shoestring and fell onto her left side, striking her head.  She reports feeling dizzy after the fall but did not pass out.  She complains of pain to her forehead, the back of her neck bilaterally as well as her left knee.  She is able to walk.  No blood thinners.  No recent illnesses.  She currently lives alone.  She does not use assistive devices.     Home Medications Prior to Admission medications   Medication Sig Start Date End Date Taking? Authorizing Provider  tiZANidine  (ZANAFLEX ) 2 MG tablet Take 1 tablet (2 mg total) by mouth every 8 (eight) hours as needed for muscle spasms. 09/30/23  Yes Griselda Norris, MD  albuterol  (PROAIR  HFA) 108 [phone] Base) MCG/ACT inhaler Inhale 2 puffs into the lungs every 6 (six) hours as needed for wheezing or shortness of breath. 12/27/22   Norleen Lynwood ORN, MD  ALPRAZolam  (XANAX ) 1 MG tablet TAKE 1 TABLET BY MOUTH AT BEDTIME AS NEEDED FOR ANXIETY 04/09/23   Norleen Lynwood ORN, MD  Budeson-Glycopyrrol-Formoterol  (BREZTRI  AEROSPHERE) 160-9-4.8 MCG/ACT AERO Inhale 2 puffs into the lungs 2 (two) times daily. 06/30/23   Norleen Lynwood ORN, MD  dexlansoprazole  (DEXILANT ) 60 MG capsule Take 1 capsule (60 mg total) by mouth daily. 06/06/23   Norleen Lynwood ORN, MD  EPINEPHrine  0.3 mg/0.3 mL IJ SOAJ injection Inject 0.3 mg into the muscle as needed for anaphylaxis. Do not refrigerate. 08/04/23   Iva Marty Saltness, MD  Erenumab-aooe 140 MG/ML SOAJ Inject  140 mg into the skin every 30 (thirty) days.    [provider]  FASENRA  30 MG/ML prefilled syringe INJECT 30MG  SUBCUTANEOUSLY EVERY 8 WEEKS 09/21/23   Iva Marty Saltness, MD  FLUoxetine  (PROZAC ) 20 MG capsule Take 1 capsule (20 mg total) by mouth daily. 06/30/23 06/29/24  Norleen Lynwood ORN, MD  gabapentin  (NEURONTIN ) 600 MG tablet Take 600 mg by mouth 2 (two) times daily. 04/30/22   [provider]  meclizine  (ANTIVERT ) 12.5 MG tablet Take 1 tablet (12.5 mg total) by mouth 3 (three) times daily as needed for dizziness. 06/30/23   Norleen Lynwood ORN, MD  montelukast  (SINGULAIR ) 10 MG tablet Take 1 tablet (10 mg total) by mouth at bedtime. 06/06/23   Norleen Lynwood ORN, MD  Naloxone HCl 4 MG/0.25ML LIQD Place 1 spray into the nose as needed.    [provider]  nitroGLYCERIN  (NITROSTAT ) 0.4 MG SL tablet Place 1 tablet (0.4 mg total) under the tongue every 5 (five) minutes as needed for chest pain. 06/06/23   Norleen Lynwood ORN, MD  nystatin  cream (MYCOSTATIN ) Apply 1 Application topically 2 (two) times daily. 09/19/23   Alvia Krabbe, FNP  promethazine  (PHENERGAN ) 12.5 MG tablet Take 1 tablet (12.5 mg total) by mouth every 8 (eight) hours as needed for nausea or vomiting. 06/30/23   Norleen Lynwood ORN, MD  traMADol  (ULTRAM ) 50 MG tablet Take 1 tablet (50 mg  total) by mouth every 6 (six) hours as needed. 09/19/23   Alvia Krabbe, FNP  valACYclovir  (VALTREX ) 500 MG tablet Take 1 tablet (500 mg total) by mouth 2 (two) times daily. 09/19/23   Alvia Krabbe, FNP  zolpidem  (AMBIEN ) 5 MG tablet Take 1 tablet (5 mg total) by mouth at bedtime as needed for sleep. 06/30/23   Norleen Lynwood ORN, MD      Allergies    Advil  Morse ], Bee venom, Codeine , Clonidine derivatives, Doxycycline , Statins, Latex, Levaquin  [levofloxacin ], and Propoxyphene n-acetaminophen     Review of Systems   Review of Systems  All other systems reviewed and are negative.   Physical Exam Updated Vital Signs BP 101/72    Pulse 63   Temp 97.8 F (36.6 C)   Resp 16   Wt 49.9 kg   SpO2 97%   BMI 18.88 kg/m  Physical Exam Vitals and nursing note reviewed.  Constitutional:      Appearance: She is well-developed.  HENT:     Head: Normocephalic.     Comments: Ecchymosis to the left temple Cardiovascular:     Rate and Rhythm: Normal rate and regular rhythm.     Heart sounds: No murmur heard. Pulmonary:     Effort: Pulmonary effort is normal. No respiratory distress.     Breath sounds: Normal breath sounds.  Abdominal:     Palpations: Abdomen is soft.     Tenderness: There is no abdominal tenderness. There is no guarding or rebound.  Musculoskeletal:     Comments: Mild tenderness over the left knee with a small abrasion.  She is able to flex and extend at the knee.  Skin:    General: Skin is warm and dry.  Neurological:     Mental Status: She is alert and oriented to person, place, and time.     Comments: 5 out of 5 strength in all 4 extremities  Psychiatric:        Behavior: Behavior normal.     ED Results / Procedures / Treatments   Labs (all labs ordered are listed, but only abnormal results are displayed) Labs Reviewed  BASIC METABOLIC PANEL - Abnormal; Notable for the following components:      Result Value   CO2 20 (*)    All other components within normal limits  CBC  TROPONIN I (HIGH SENSITIVITY)    EKG None  Radiology CT Head Wo Contrast Result Date: 09/30/2023 CLINICAL DATA:  Polytrauma, blunt she was on her porch and tripped on her shoe string. Pt reports hitting a metal piece on the porch, happened around noon 09/29/23 EXAM: CT HEAD WITHOUT CONTRAST CT CERVICAL SPINE WITHOUT CONTRAST TECHNIQUE: Multidetector CT imaging of the head and cervical spine was performed following the standard protocol without intravenous contrast. Multiplanar CT image reconstructions of the cervical spine were also generated. RADIATION DOSE REDUCTION: This exam was performed according to the  departmental dose-optimization program which includes automated exposure control, adjustment of the mA and/or kV according to patient size and/or use of iterative reconstruction technique. COMPARISON:  CT head and C-spine 12/28/2018 trauma CT head 03/28/2021 FINDINGS: CT HEAD FINDINGS Brain: Patchy and confluent areas of decreased attenuation are noted throughout the deep and periventricular white matter of the cerebral hemispheres bilaterally, compatible with chronic microvascular ischemic disease. No evidence of large-territorial acute infarction. No parenchymal hemorrhage. No mass lesion. No extra-axial collection. No mass effect or midline shift. No hydrocephalus. Basilar cisterns are patent. Vascular: No hyperdense vessel. Atherosclerotic calcifications are present within  the cavernous internal carotid arteries. Skull: No acute fracture or focal lesion. Sinuses/Orbits: Paranasal sinuses and mastoid air cells are clear. Bilateral lens replacement. Otherwise the orbits are unremarkable. Other: None. CT CERVICAL SPINE FINDINGS Alignment: Grade 1 anterolisthesis C7 on T1. Mild retrolisthesis of C2 on C3. Skull base and vertebrae: C3 through C5 anterior cervical discectomy and fusion. Partial osseous fusion at the C3 through C7 levels. No severe osseous neural foraminal or central canal stenosis. No acute fracture. No aggressive appearing focal osseous lesion or focal pathologic process. Soft tissues and spinal canal: No prevertebral fluid or swelling. No visible canal hematoma. Upper chest: Biapical pleural/pulmonary scarring. Paraseptal and centrilobular emphysematous. Other: Atherosclerotic plaque. IMPRESSION: 1. No acute intracranial abnormality. 2. No acute displaced fracture or traumatic listhesis of the cervical spine. 3.  Emphysema (ICD10-J43.9). Electronically Signed   By: Morgane  Naveau M.D.   On: 09/30/2023 01:05   CT Cervical Spine Wo Contrast Result Date: 09/30/2023 CLINICAL DATA:  Polytrauma, blunt  she was on her porch and tripped on her shoe string. Pt reports hitting a metal piece on the porch, happened around noon 09/29/23 EXAM: CT HEAD WITHOUT CONTRAST CT CERVICAL SPINE WITHOUT CONTRAST TECHNIQUE: Multidetector CT imaging of the head and cervical spine was performed following the standard protocol without intravenous contrast. Multiplanar CT image reconstructions of the cervical spine were also generated. RADIATION DOSE REDUCTION: This exam was performed according to the departmental dose-optimization program which includes automated exposure control, adjustment of the mA and/or kV according to patient size and/or use of iterative reconstruction technique. COMPARISON:  CT head and C-spine 12/28/2018 trauma CT head 03/28/2021 FINDINGS: CT HEAD FINDINGS Brain: Patchy and confluent areas of decreased attenuation are noted throughout the deep and periventricular white matter of the cerebral hemispheres bilaterally, compatible with chronic microvascular ischemic disease. No evidence of large-territorial acute infarction. No parenchymal hemorrhage. No mass lesion. No extra-axial collection. No mass effect or midline shift. No hydrocephalus. Basilar cisterns are patent. Vascular: No hyperdense vessel. Atherosclerotic calcifications are present within the cavernous internal carotid arteries. Skull: No acute fracture or focal lesion. Sinuses/Orbits: Paranasal sinuses and mastoid air cells are clear. Bilateral lens replacement. Otherwise the orbits are unremarkable. Other: None. CT CERVICAL SPINE FINDINGS Alignment: Grade 1 anterolisthesis C7 on T1. Mild retrolisthesis of C2 on C3. Skull base and vertebrae: C3 through C5 anterior cervical discectomy and fusion. Partial osseous fusion at the C3 through C7 levels. No severe osseous neural foraminal or central canal stenosis. No acute fracture. No aggressive appearing focal osseous lesion or focal pathologic process. Soft tissues and spinal canal: No prevertebral fluid  or swelling. No visible canal hematoma. Upper chest: Biapical pleural/pulmonary scarring. Paraseptal and centrilobular emphysematous. Other: Atherosclerotic plaque. IMPRESSION: 1. No acute intracranial abnormality. 2. No acute displaced fracture or traumatic listhesis of the cervical spine. 3.  Emphysema (ICD10-J43.9). Electronically Signed   By: Morgane  Naveau M.D.   On: 09/30/2023 01:05   DG Ribs Unilateral W/Chest Left Result Date: 09/30/2023 CLINICAL DATA:  Status post fall. EXAM: LEFT RIBS AND CHEST - 3+ VIEW COMPARISON:  May 13, 2023 FINDINGS: No acute fracture is seen involving the ribs. Chronic fourth and fifth left rib fractures are noted. There is no evidence of pneumothorax or pleural effusion. Both lungs are clear. Heart size and mediastinal contours are within normal limits. IMPRESSION: Chronic fourth and fifth left rib fractures. Electronically Signed   By: Suzen Dials M.D.   On: 09/30/2023 00:46   DG Knee Complete 4 Views Left Result  Date: 09/30/2023 CLINICAL DATA:  Status post fall. EXAM: LEFT KNEE - COMPLETE 4+ VIEW COMPARISON:  April 21, 2019 FINDINGS: No evidence of fracture, dislocation, or joint effusion. No evidence of significant arthropathy. Benign, linear sclerotic areas are again seen extending across the medullary regions of the distal left femoral shaft and proximal left tibial shaft. Soft tissues are unremarkable. IMPRESSION: No acute osseous abnormality. Electronically Signed   By: Suzen Dials M.D.   On: 09/30/2023 00:43    Procedures Procedures    Medications Ordered in ED Medications  acetaminophen  (TYLENOL ) tablet 1,000 mg (has no administration in time range)  ondansetron  (ZOFRAN -ODT) disintegrating tablet 8 mg (has no administration in time range)    ED Course/ Medical Decision Making/ A&P                                 Medical Decision Making Risk Prescription drug management.   Patient here for evaluation of injuries following a trip  and fall.  She does have a contusion to her left forehead.  She is neurologically intact on evaluation.  She also has some mild soft tissue tenderness over the left knee but is able to range the knee without difficulty.  She can ambulate in the emergency department.  Plain films, CT scans are negative for acute fracture or acute intra cranial abnormality.  She does have chronic left-sided rib fractures.  CBC without significant anemia.  BMP without significant electrolyte abnormality.  Discussed with patient home care for contusions following a fall.  She is on multiple sedating medications.  Discussed that she may need to discontinue some of these medications to help prevent falls.  Discussed outpatient follow-up and return precautions.        Final Clinical Impression(s) / ED Diagnoses Final diagnoses:  Fall, initial encounter  Contusion of face, initial encounter  Contusion of left knee, initial encounter  Strain of neck muscle, initial encounter    Rx / DC Orders ED Discharge Orders          Ordered    tiZANidine  (ZANAFLEX ) 2 MG tablet  Every 8 hours PRN        09/30/23 0458              Griselda Norris, MD 09/30/23 0502

## 2023-10-01 ENCOUNTER — Telehealth: Payer: Self-pay

## 2023-10-01 NOTE — Transitions of Care (Post Inpatient/ED Visit) (Signed)
   10/01/2023  Name: Lori [name] MRN: [id number] DOB: 19-Apr-1956  Today's TOC FU Call Status: Today's TOC FU Call Status:: Unsuccessful Call (1st Attempt) Unsuccessful Call (1st Attempt) Date: 10/01/23  Attempted to reach the patient regarding the most recent Inpatient/ED visit.  Follow Up Plan: Additional outreach attempts will be made to reach the patient to complete the Transitions of Care (Post Inpatient/ED visit) call.   Signature Karena Addison, LPN [facility] Nurse Health Advisor Direct Dial [phone]

## 2023-10-02 ENCOUNTER — Ambulatory Visit: Payer: Self-pay | Admitting: Internal Medicine

## 2023-10-02 NOTE — Telephone Encounter (Signed)
Copied from CRM [id number]. Topic: Clinical - Medication Question >> Oct 02, 2023  8:16 AM Adaysia C wrote: Reason for CRM: Patient fell on Monday 09/29/2023 and cracked her rib and sustained other painful injuries as well. The patient was seen in the hospital on Tuesday 09/30/2023 and they instructed her to follow up with her PCP. The patient is in a lot of pain would like to know if the provider can prescribe something for the pain. Please follow up with patient [phone].    Chief Complaint: Pain Symptoms: Left arm pain and rib pain Frequency: Ongoing for the past few days Disposition: [] ED /[] Urgent Care (no appt availability in office) / [] Appointment(In office/virtual)/ []  [facility]/ [] Home Care/ [] Refused Recommended Disposition /[] [facility]/ [x]  Follow-up with PCP  Additional Notes: Patient was in the ED on 1/13 due to recent fall. She stated she is still having a lot of pain. The pain is the same as when she was in the hospital. Patient denies new symptoms. She stated she is taking Tylenol and Advil without relief. Attempted to schedule a follow up appointment today and patient declined. She stated she the roads are too icy for her to go out and she doesn't have anyone to take her either. Unable to offer virtual appt since patient has no mychart. Patient was informed that a message is being sent and the office will follow up with her.   Answer Assessment - Initial Assessment Questions 1. LOCATION: "Where is the pain located?"     Left arm and ribs  2. PAIN: "How bad is the pain?" (Scale 1-10; or mild, moderate, severe)   - MILD (1-3): Doesn't interfere with normal activities.   - MODERATE (4-7): Interferes with normal activities (e.g., work or school) or awakens from sleep.   - SEVERE (8-10): Excruciating pain, unable to do any normal activities, unable to hold a cup of water.     10/10  3. CAUSE: "What do you think is causing the arm pain?"     Patient  had a recent fall (has been evaluated in ED on 1/13)  4. OTHER SYMPTOMS: "Do you have any other symptoms?" (e.g., neck pain, swelling, rash, fever, numbness, weakness)     No  Protocols used: Arm Pain-A-AH

## 2023-10-03 ENCOUNTER — Ambulatory Visit (INDEPENDENT_AMBULATORY_CARE_PROVIDER_SITE_OTHER): Payer: 59

## 2023-10-03 DIAGNOSIS — J309 Allergic rhinitis, unspecified: Secondary | ICD-10-CM

## 2023-10-06 DIAGNOSIS — J302 Other seasonal allergic rhinitis: Secondary | ICD-10-CM

## 2023-10-06 NOTE — Progress Notes (Signed)
VIALS EXP 10-05-24

## 2023-10-07 DIAGNOSIS — J3081 Allergic rhinitis due to animal (cat) (dog) hair and dander: Secondary | ICD-10-CM

## 2023-10-07 NOTE — Transitions of Care (Post Inpatient/ED Visit) (Unsigned)
   10/07/2023  Name: Lori [name] MRN: [id number] DOB: 13-Nov-1955  Today's TOC FU Call Status: Today's TOC FU Call Status:: Unsuccessful Call (2nd Attempt) Unsuccessful Call (1st Attempt) Date: 10/01/23 Unsuccessful Call (2nd Attempt) Date: 10/07/23  Attempted to reach the patient regarding the most recent Inpatient/ED visit.  Follow Up Plan: Additional outreach attempts will be made to reach the patient to complete the Transitions of Care (Post Inpatient/ED visit) call.   Signature Karena Addison, LPN [facility] Nurse Health Advisor Direct Dial [phone]

## 2023-10-08 NOTE — Transitions of Care (Post Inpatient/ED Visit) (Signed)
   10/08/2023  Name: Lori [name] MRN: [id number] DOB: July 20, 1956  Today's TOC FU Call Status: Today's TOC FU Call Status:: Unsuccessful Call (3rd Attempt) Unsuccessful Call (1st Attempt) Date: 10/01/23 Unsuccessful Call (2nd Attempt) Date: 10/07/23 Unsuccessful Call (3rd Attempt) Date: 10/08/23  Attempted to reach the patient regarding the most recent Inpatient/ED visit.  Follow Up Plan: No further outreach attempts will be made at this time. We have been unable to contact the patient.  Signature Karena Addison, LPN [facility] Nurse Health Advisor Direct Dial [phone]

## 2023-10-14 DIAGNOSIS — G43719 Chronic migraine without aura, intractable, without status migrainosus: Secondary | ICD-10-CM | POA: Diagnosis not present

## 2023-10-16 ENCOUNTER — Other Ambulatory Visit: Payer: Self-pay | Admitting: Internal Medicine

## 2023-10-16 NOTE — Telephone Encounter (Signed)
Copied from CRM [id number]. Topic: Clinical - Medication Refill >> Oct 16, 2023  2:54 PM Lennart Pall wrote: Most Recent Primary Care Visit:  Provider: Moshe Cipro  Department: [facility]  Visit Type: ACUTE  Date: 09/19/2023  Medication: dexlansoprazole  Has the patient contacted their pharmacy? Yes (Agent: If no, request that the patient contact the pharmacy for the refill. If patient does not wish to contact the pharmacy document the reason why and proceed with request.) (Agent: If yes, when and what did the pharmacy advise?)  Is this the correct pharmacy for this prescription? Yes If no, delete pharmacy and type the correct one.  This is the patient's preferred pharmacy:  [facility] [address] [place] - [address] [place] [address] Phone: [phone] Fax: [phone]   Has the prescription been filled recently? Yes  Is the patient out of the medication? Yes  Has the patient been seen for an appointment in the last year OR does the patient have an upcoming appointment? Yes  Can we respond through MyChart? Yes  Agent: Please be advised that Rx refills may take up to 3 business days. We ask that you follow-up with your pharmacy.

## 2023-10-18 IMAGING — DX DG RIBS W/ CHEST 3+V*R*
5 series · 5 of 5 positions shown · non-contrast
Comparison: 05/23/2021

CLINICAL DATA: Twisting injury with painful lower right ribs.

EXAM:
RIGHT RIBS AND CHEST - 3+ VIEW

[chest pa]
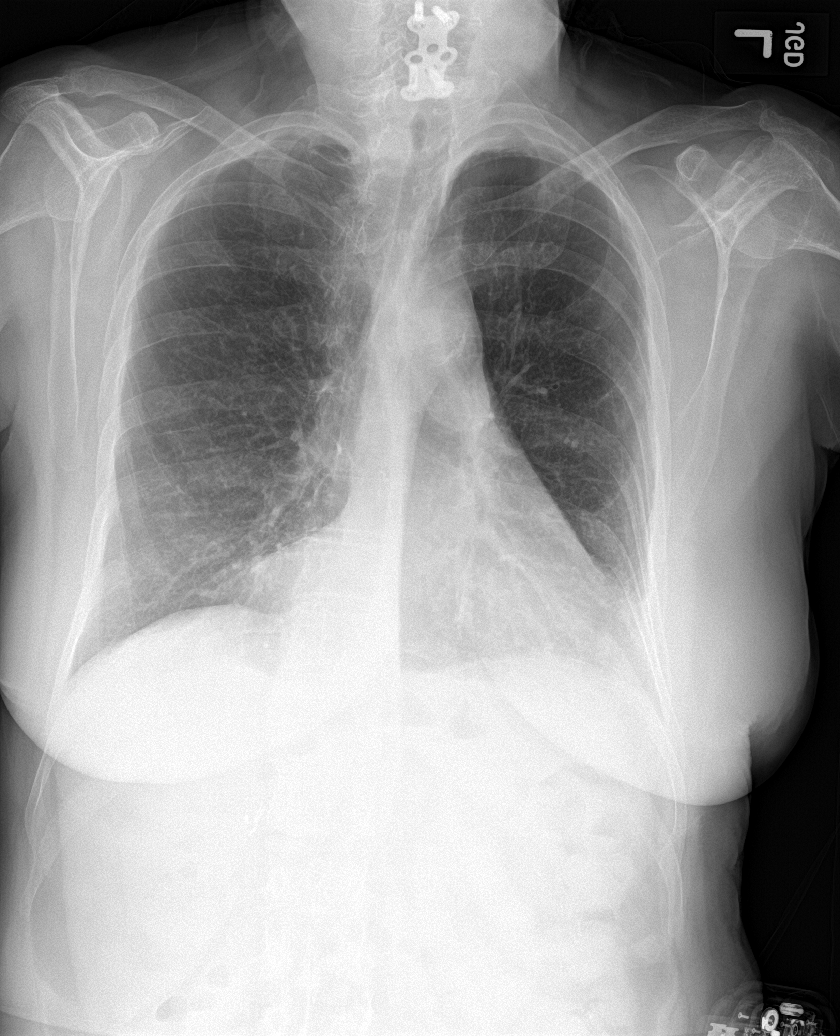

[rib obl (1 of 2)]
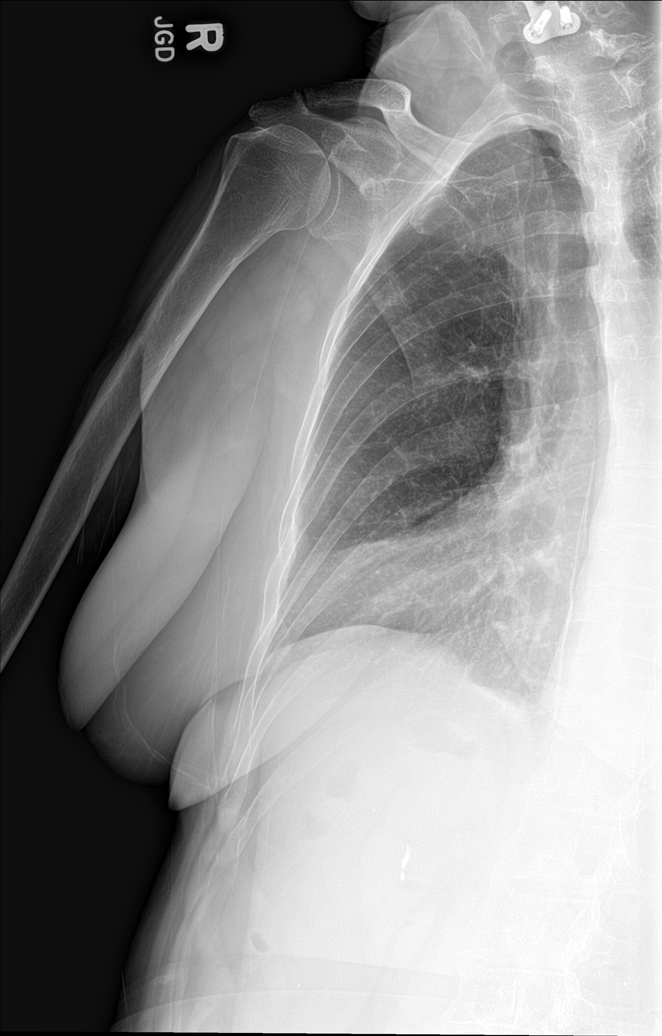

[rib obl (2 of 2)]
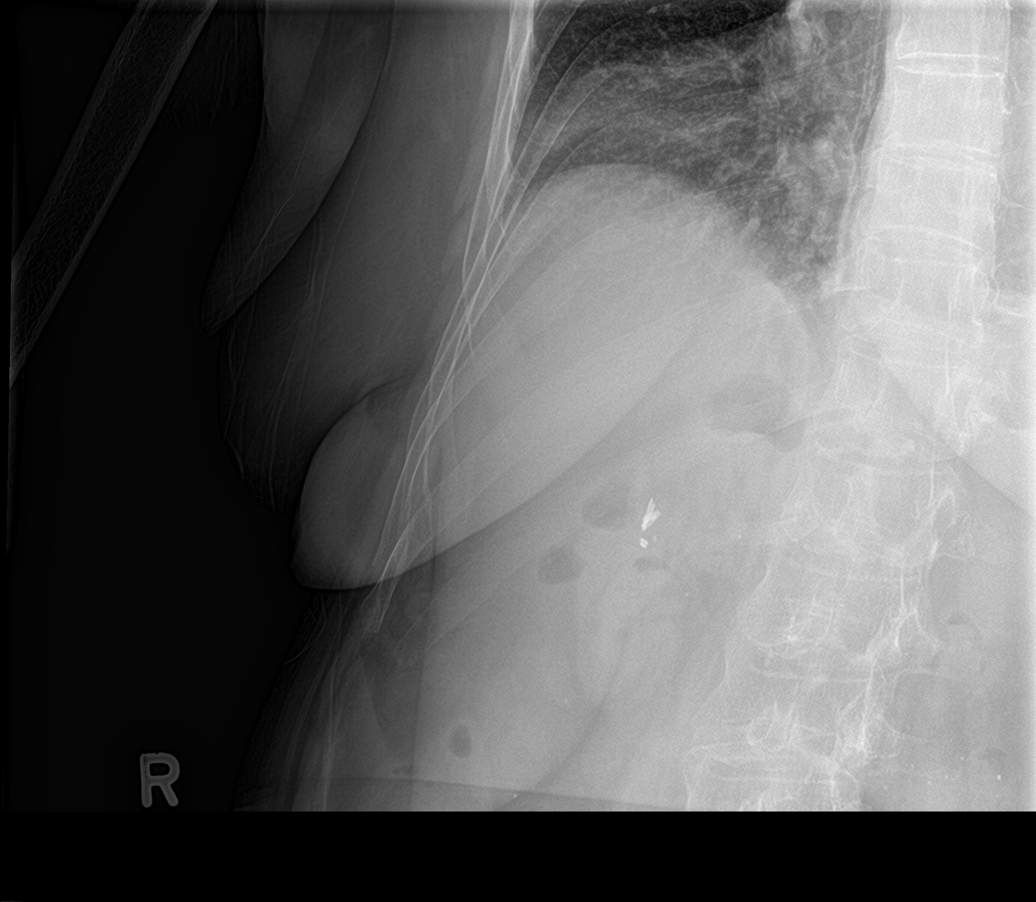

[rib ap (1 of 2)]
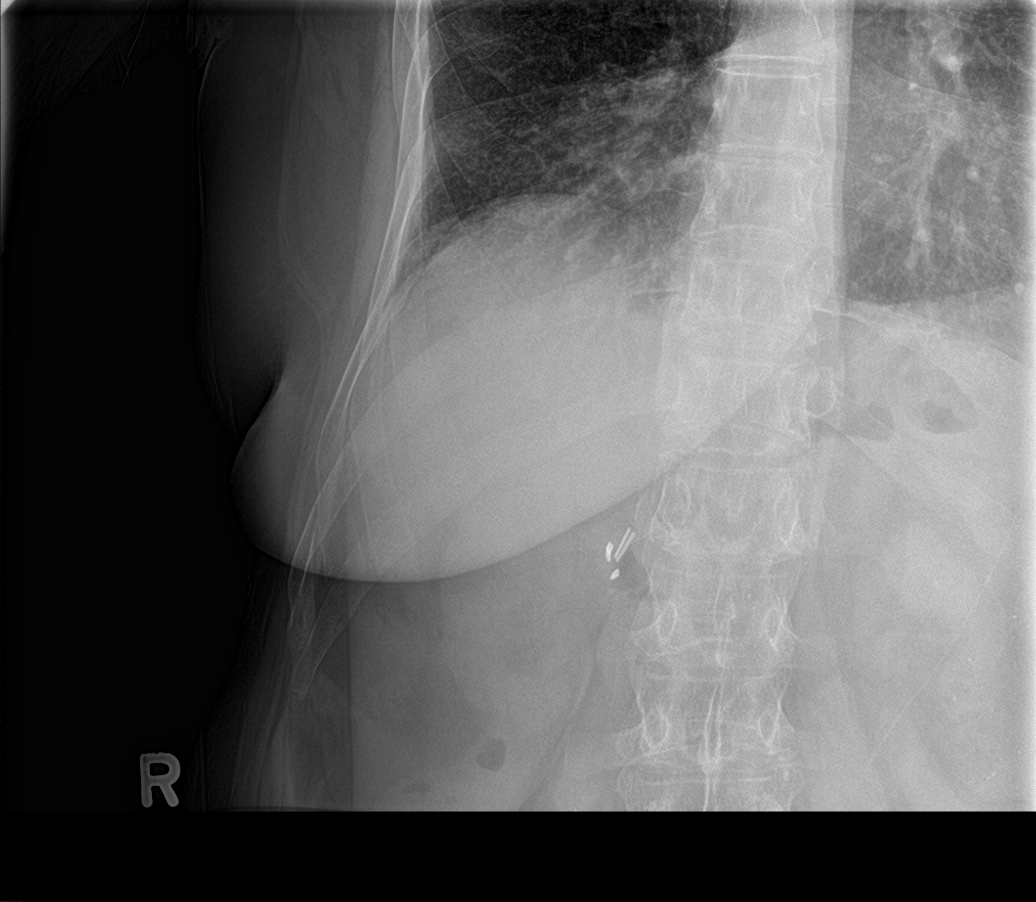

[rib ap (2 of 2)]
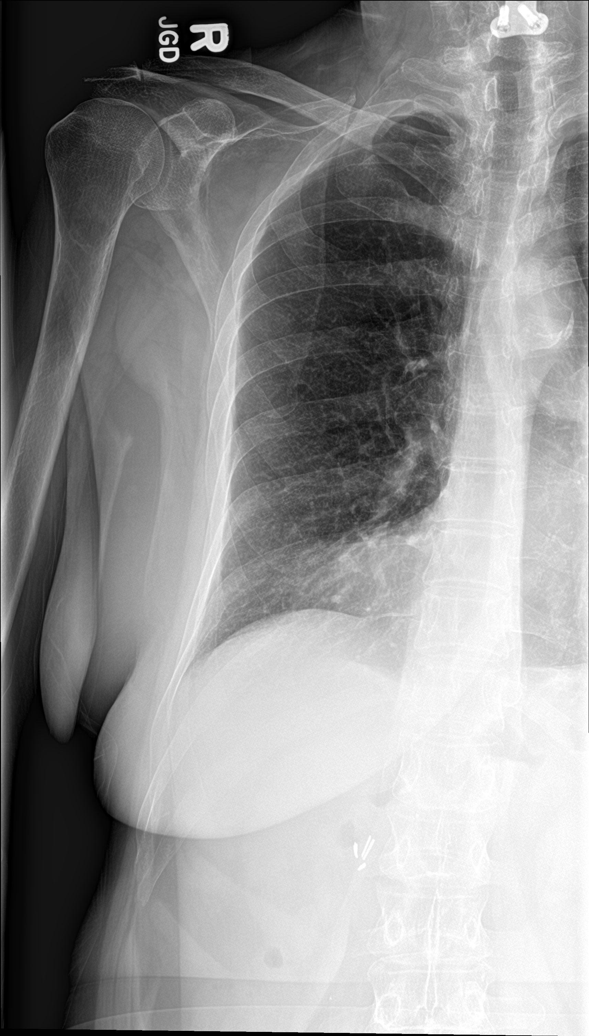

[5 of 5 positions shown; findings below may reference images not displayed]

FINDINGS: Frontal view of the chest and three views of right ribs. Cervical
spine fixation. Midline trachea. Normal heart size. Atherosclerosis
in the transverse aorta. Fat containing Morgagni hernia again
distorts the right heart border.

No pleural effusion or pneumothorax.  Clear lungs.

Cholecystectomy.

Rib radiographs demonstrate no acute displaced fracture. There may
be deformities of mid lateral right ribs indicative of remote
trauma. Example sixth and seventh lateral right ribs on the first
rib radiograph.
IMPRESSION: No acute rib fracture, pleural fluid, or pneumothorax.

Possible remote right rib fractures.

Aortic Atherosclerosis ([id number]).

## 2023-10-21 ENCOUNTER — Ambulatory Visit (INDEPENDENT_AMBULATORY_CARE_PROVIDER_SITE_OTHER): Payer: 59 | Admitting: Orthopaedic Surgery

## 2023-10-21 ENCOUNTER — Encounter: Payer: Self-pay | Admitting: Orthopaedic Surgery

## 2023-10-21 VITALS — BP 118/60 | HR 78 | Ht 64.0 in | Wt 112.0 lb

## 2023-10-21 DIAGNOSIS — M5440 Lumbago with sciatica, unspecified side: Secondary | ICD-10-CM | POA: Diagnosis not present

## 2023-10-21 DIAGNOSIS — R42 Dizziness and giddiness: Secondary | ICD-10-CM

## 2023-10-21 NOTE — Progress Notes (Signed)
 Office Visit Note   Patient: Lori [name]           Date of Birth: [birth year]           MRN: [id number] Visit Date: 10/21/2023              Requested by: Norleen Lynwood ORN, MD [address] [place],  [place] [address] PCP: Norleen Lynwood ORN, MD   Assessment & Plan: Visit Diagnoses:  1. Vertigo   2. Acute bilateral low back pain with sciatica, sciatica laterality unspecified     Plan: Patient with generalized weakness poor balance history of falls, conversion reaction and long-term smoking with emphysema.  She needs to use a quad cane and will try to work on walking more improve her balance get outside and try to strengthen her legs with activities.  She will try to get out walking the neighborhood work on strengthening and balance and then gradually resume a gentle exercise program.  Follow-up as needed.  Follow-Up Instructions: No follow-ups on file.   Orders:  No orders of the defined types were placed in this encounter.  No orders of the defined types were placed in this encounter.     Procedures: No procedures performed   Clinical Data: No additional findings.   Subjective: Chief Complaint  Patient presents with   Neck - Pain   Lower Back - Pain   Middle Back - Pain    HPI 68 year old female returns states recently her mother passed away has had dementia.  Had 4 episodes of falling.  Weakness in her legs at times previous multilevel cervical fusion which is solid.  She has not been very active and has not been using a cane.  She is smoking cigarettes has COPD conversion reaction, poor compliance history.  Recent CT after fall CT head and CT cervical spine showed solid fusion with only mild degenerative changes below the fusions.  Review of Systems all of systems negative to HPI.   Objective: Vital Signs: BP 118/60   Pulse 78   Ht 5' 4 (1.626 m)   Wt 112 lb (50.8 kg)   BMI 19.22 kg/m   Physical Exam Constitutional:      Appearance: She is well-developed.   HENT:     Head: Normocephalic.     Right Ear: External ear normal.     Left Ear: External ear normal. There is no impacted cerumen.  Eyes:     Pupils: Pupils are equal, round, and reactive to light.  Neck:     Thyroid : No thyromegaly.     Trachea: No tracheal deviation.  Cardiovascular:     Rate and Rhythm: Normal rate.  Pulmonary:     Effort: Pulmonary effort is normal.  Abdominal:     Palpations: Abdomen is soft.  Musculoskeletal:     Cervical back: No rigidity.  Skin:    General: Skin is warm and dry.  Neurological:     Mental Status: She is alert and oriented to person, place, and time.  Psychiatric:        Behavior: Behavior normal.     Ortho Exam upper and lower extremity reflexes are 2+ to 3+ and symmetrical no clonus.  Extremely slow gait poor balance.  Specialty Comments:  No specialty comments available.  Imaging: No results found.   PMFS History: Patient Active Problem List   Diagnosis Date Noted   Chest pain 06/08/2023   Hypomagnesemia 05/15/2023   Upper abdominal pain 09/19/2022   Constipation  09/19/2022   Lower abdominal pain 06/25/2022   COVID-19 virus infection 06/09/2022   Pain in left ankle and joints of left foot 05/15/2022   Weight loss 01/10/2022   Arthritis of carpometacarpal [facility]) joint of right thumb 10/25/2021   Right-sided chest pain 09/05/2021   Neck pain 04/11/2021   Thoracic spine pain 04/11/2021   Low back pain 04/11/2021   Bilateral hip pain 04/11/2021   AKI (acute kidney injury) (HCC) 04/11/2021   Allergic rhinitis 01/08/2021   Abnormal urine odor 11/14/2020   Aortic atherosclerosis (HCC) 11/14/2020   Severe persistent asthma without complication 11/02/2020   Hymenoptera allergy 11/02/2020   Genital herpes 11/12/2019   Coccyx pain 11/12/2019   Insomnia 11/12/2019   Acute right-sided low back pain without sciatica 07/20/2019   Grief 07/20/2019   Vitamin D  deficiency 07/20/2019   Hyperglycemia 04/21/2019   Left knee pain  04/21/2019   Pain 12/29/2018   B12 deficiency 10/20/2018   Bilateral hand pain 08/03/2018   Nausea 04/27/2018   Anemia, iron  deficiency 04/27/2018   Intermittent paresthesia of hand and foot 04/27/2018   Headache 03/21/2018   Lumbar radiculitis 08/15/2017   Degenerative disc disease, lumbar 07/21/2017   Encounter for well adult exam with abnormal findings 06/30/2017   Epigastric pain 06/30/2017   Restrictive lung disease 06/30/2017   Leg cramping 06/09/2017   Elbow contusion 06/09/2017   Right elbow pain 05/27/2017   Asthma-COPD overlap syndrome (HCC) 01/31/2017   Seasonal and perennial allergic rhinitis 01/01/2017   Moderate persistent asthma, uncomplicated 01/01/2017   Pulmonary emphysema (HCC) 12/03/2016   Tobacco abuse 12/03/2016   Non-seasonal allergic rhinitis due to fungal spores 12/03/2016   Medication management 11/29/2016   Rash 11/23/2016   Gastroesophageal reflux disease    Community acquired pneumonia of left lower lobe of lung 10/26/2016   Vertigo 10/22/2016   Gastroesophageal reflux disease with esophagitis 10/22/2016   Non compliance w medication regimen 08/01/2016   Allergic contact dermatitis due to metals 08/01/2016   Migraine without aura and without status migrainosus, not intractable 07/22/2016   Spasm of muscle of lower back 06/21/2016   Chronic rhinitis 06/19/2016   Compliance poor 06/19/2016   Gait difficulty 06/21/2015   Fibromyalgia 06/21/2015   Conversion reaction 06/21/2015   Anxiety and depression 06/21/2015   Syncope and collapse 06/21/2015   Plica syndrome of left knee 01/06/2015   Anxiety state 09/06/2014   History of MI 09/06/2014   Cigarette smoker 06/27/2013   Borderline hypertension 06/27/2013   Chronic low back pain with sciatica 06/14/2013   Leg pain, bilateral 06/14/2013   Gastroparesis 10/26/2009   Musculoskeletal chest pain 05/02/2009   Hyperlipidemia LDL goal <100 11/18/2008   COPD exacerbation (HCC) 11/18/2008   HEPATIC  CYST 11/18/2008   Past Medical History:  Diagnosis Date   Allergic rhinitis    Anemia    hgb 13.9 on 11/22/21   Ankle fracture 05/2016   Anxiety    Arthritis    Asthma    Barrett esophagus 2007   Chest pain    Hx, no current problems as of 11/22/21.  Recurrent episodes.  Has had 3 negative nuclear stress test, and a completely normal coronary CT angiogram   COPD (chronic obstructive pulmonary disease) with chronic bronchitis (HCC)    & Emphysema   Depression    Duodenitis without mention of hemorrhage 2007   Esophageal reflux 2007   Esophageal stricture    Full dentures    patient does not wear dentures as of 11/22/21  H/O hiatal hernia    Hiatal hernia 2006,2007   HSV infection    Hyperlipidemia    rx crestor  but pt not taking   Multiple fractures    from falls, fx rt. elbow, fx left wrist, bilateral ankles   Pneumonia 10/2016   Restrictive lung disease 06/30/2017   Ulcer    no current problems as of 11/22/21    Family History  Problem Relation Age of Onset   Emphysema Father    Dementia Mother    Diabetes Maternal Grandmother    Diabetes Maternal Aunt    Diabetes Other        mat. cousin   Heart disease Brother    Colon cancer Neg Hx    Allergic rhinitis Neg Hx    Angioedema Neg Hx    Asthma Neg Hx    Atopy Neg Hx    Eczema Neg Hx    Immunodeficiency Neg Hx    Urticaria Neg Hx     Past Surgical History:  Procedure Laterality Date   ABDOMINAL HYSTERECTOMY     BSO   APPENDECTOMY     BIOPSY  04/04/2022   Procedure: BIOPSY;  Surgeon: Wilhelmenia, Aloha Raddle., MD;  Location: [facility];  Service: Gastroenterology;;   CESAREAN SECTION     x 2   CHOLECYSTECTOMY     CHONDROPLASTY Left 01/06/2015   Procedure: CHONDROPLASTY;  Surgeon: Oneil JAYSON Herald, MD;  Location: [facility];  Service: Orthopedics;  Laterality: Left;   COLONOSCOPY     CT CTA CORONARY W/CA SCORE W/CM &/OR WO/CM  12/02/2016   CORONARY CALCIUM  SCORE 0.  NO EVIDENCE OF CAD.  Normal-sized  PA.  Normal pulmonary venous drainage the left atrium.  No ASD or VSD.   ELBOW FRACTURE SURGERY     right   ESOPHAGOGASTRODUODENOSCOPY (EGD) WITH PROPOFOL  N/A 04/04/2022   Procedure: ESOPHAGOGASTRODUODENOSCOPY (EGD) WITH PROPOFOL ;  Surgeon: Wilhelmenia Aloha Raddle., MD;  Location: [facility] END[facility]COPY;  Service: Gastroenterology;  Laterality: N/A;   EUS N/A 04/04/2022   Procedure: UPPER ENDOSCOPIC ULTRASOUND (EUS) RADIAL;  Surgeon: Wilhelmenia Aloha Raddle., MD;  Location: [facility] ENDOSCOPY;  Service: Gastroenterology;  Laterality: N/A;   EYE SURGERY Bilateral    cataracts removed   INSERTION OF MESH N/A 11/12/2022   Procedure: INSERTION OF MESH;  Surgeon: Rubin Calamity, MD;  Location: [facility] OR;  Service: General;  Laterality: N/A;   KNEE ARTHROSCOPY WITH EXCISION PLICA Left 01/06/2015   Procedure: KNEE ARTHROSCOPY WITH EXCISION PLICA;  Surgeon: Oneil JAYSON Herald, MD;  Location: [facility];  Service: Orthopedics;  Laterality: Left;   LIGAMENT REPAIR Right 11/28/2021   Procedure: RIGHT THUMB LIGAMENT RECONSTRUCTION AND TENDON INTERPOSITION;  Surgeon: Romona Harari, MD;  Location: [facility] OR;  Service: Orthopedics;  Laterality: Right;   Lower Extremity Arterial Dopplers  07/06/2013   RABI 1.0, LABI 1.1.; No evidence of significant vascular atherogenic plaque   NECK SURGERY     NM MYOVIEW  LTD  09/2014   a) LOW RISK. Normal EF of 65%, no RWMA. NO ISCHEMIA OR INFARCTION.;; b) 05/02/2009: Normal study.  LOW RISK.  No ischemia or infarction.  EF 74%.  Breast attenuation noted.   TONSILLECTOMY     TRANSTHORACIC ECHOCARDIOGRAM  03/28/2021   a) Normal EF 60 to 65%.  GR 1 DD.  No R WMA.  Normal RV size and function.  Normal RAP.  Normal valves. => NORMAL;; b) 04/23/16/2010: Normal study.  LOW RISK.  No ischemia or infarction.  EF 74%.  Breast  attenuation noted.17/2010: ([facility]) EF>55%.  Normal wall motion.  Normal valves.   WRIST FRACTURE SURGERY     left, has plate   XI ROBOTIC ASSISTED PARAESOPHAGEAL HERNIA REPAIR  N/A 11/12/2022   Procedure: ROBOTIC DIAPHRAGMATIC HERNIA REPAIR WITH MESH;  Surgeon: Rubin Calamity, MD;  Location: [facility] OR;  Service: General;  Laterality: N/A;   Social History   Occupational History   Occupation: disable  Tobacco Use   Smoking status: Every Day    Current packs/day: 0.50    Average packs/day: 0.5 packs/day for 46.2 years (23.1 ttl pk-yrs)    Types: Cigarettes    Start date: 08/06/1977   Smokeless tobacco: Never   Tobacco comments:    Patient has cut back to 2-3 per day as of 11/22/21  Vaping Use   Vaping status: Never Used  Substance and Sexual Activity   Alcohol use: No    Alcohol/week: 0.0 standard drinks of alcohol   Drug use: No   Sexual activity: Not Currently    Birth control/protection: Surgical    Comment: Hysterectomy

## 2023-10-27 ENCOUNTER — Ambulatory Visit (INDEPENDENT_AMBULATORY_CARE_PROVIDER_SITE_OTHER): Payer: 59 | Admitting: *Deleted

## 2023-10-27 ENCOUNTER — Ambulatory Visit: Payer: Self-pay | Admitting: *Deleted

## 2023-10-27 DIAGNOSIS — J4550 Severe persistent asthma, uncomplicated: Secondary | ICD-10-CM

## 2023-10-28 ENCOUNTER — Ambulatory Visit: Payer: 59

## 2023-10-30 IMAGING — DX DG CHEST 2V
2 series · 2 of 2 positions shown · non-contrast
Comparison: 08/24/2021

CLINICAL DATA: Severe right anterior chest wall pain

EXAM:
CHEST - 2 VIEW

[chest pa]
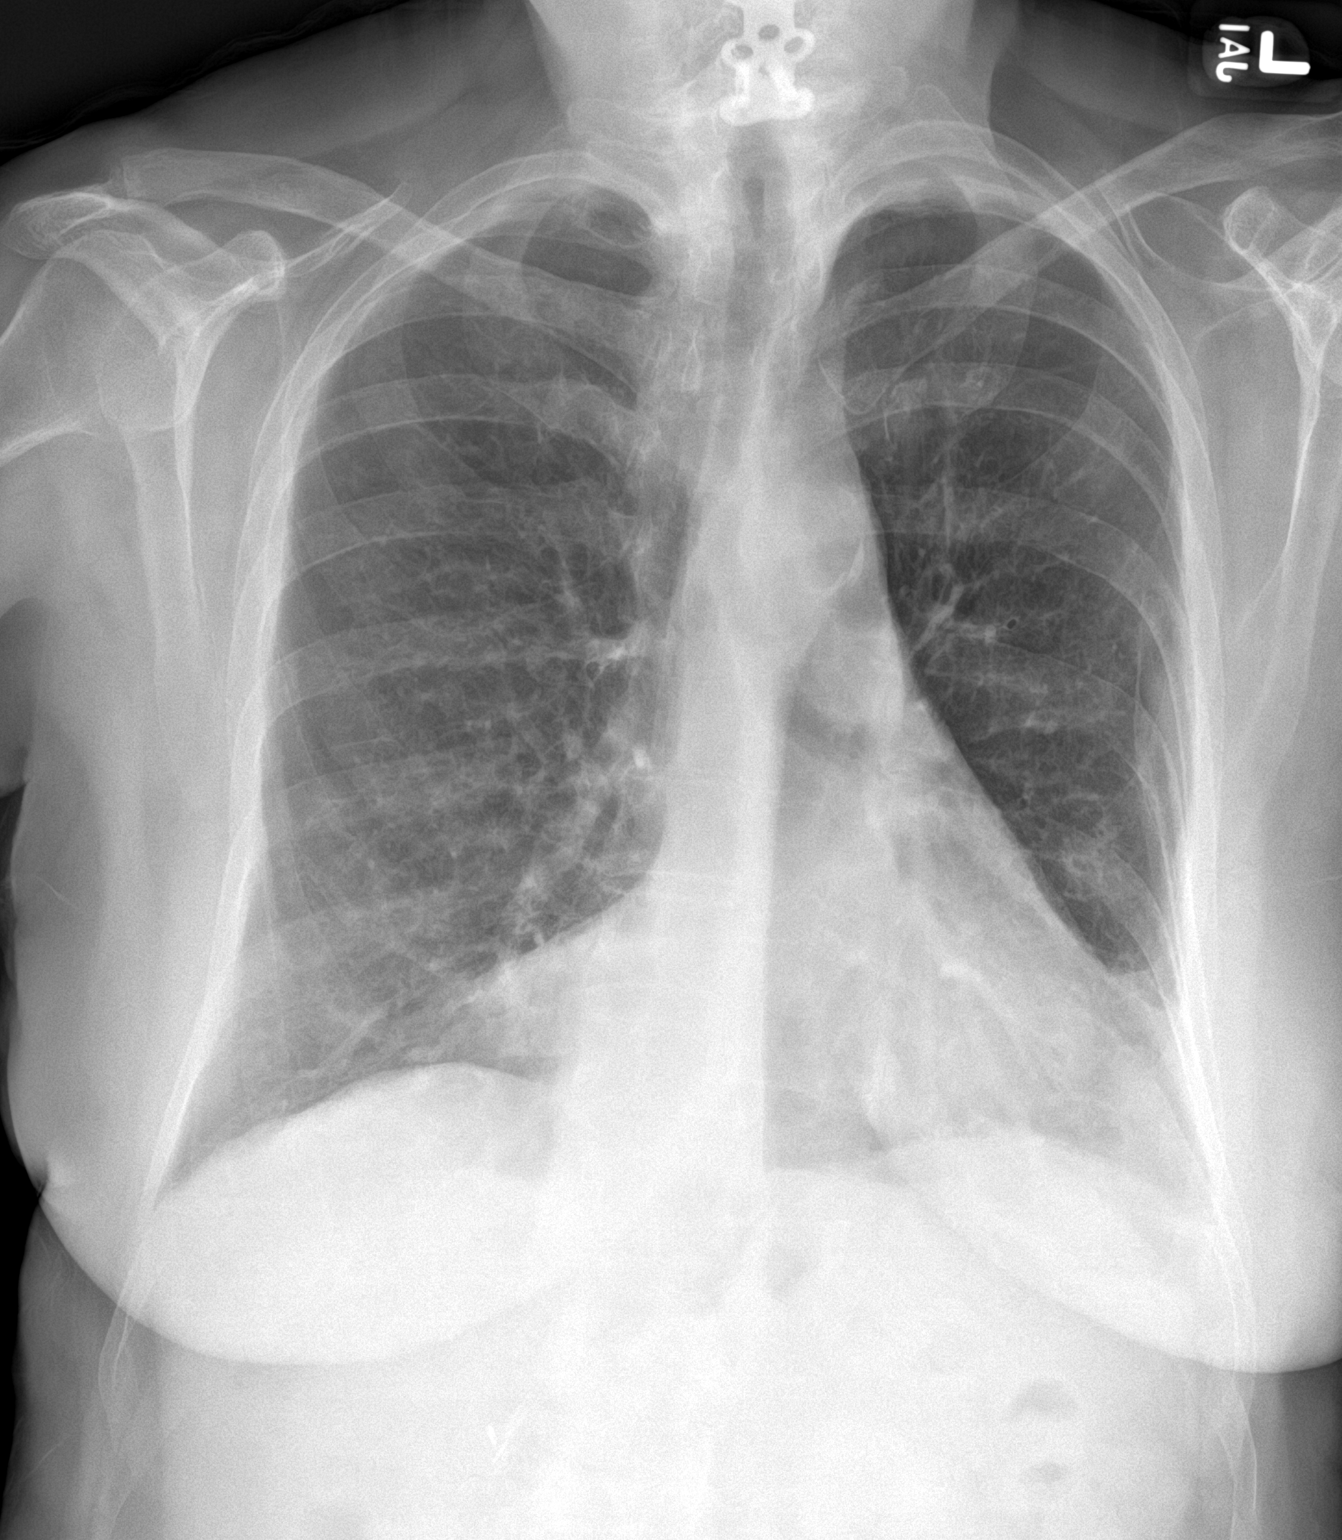

[chest lat]
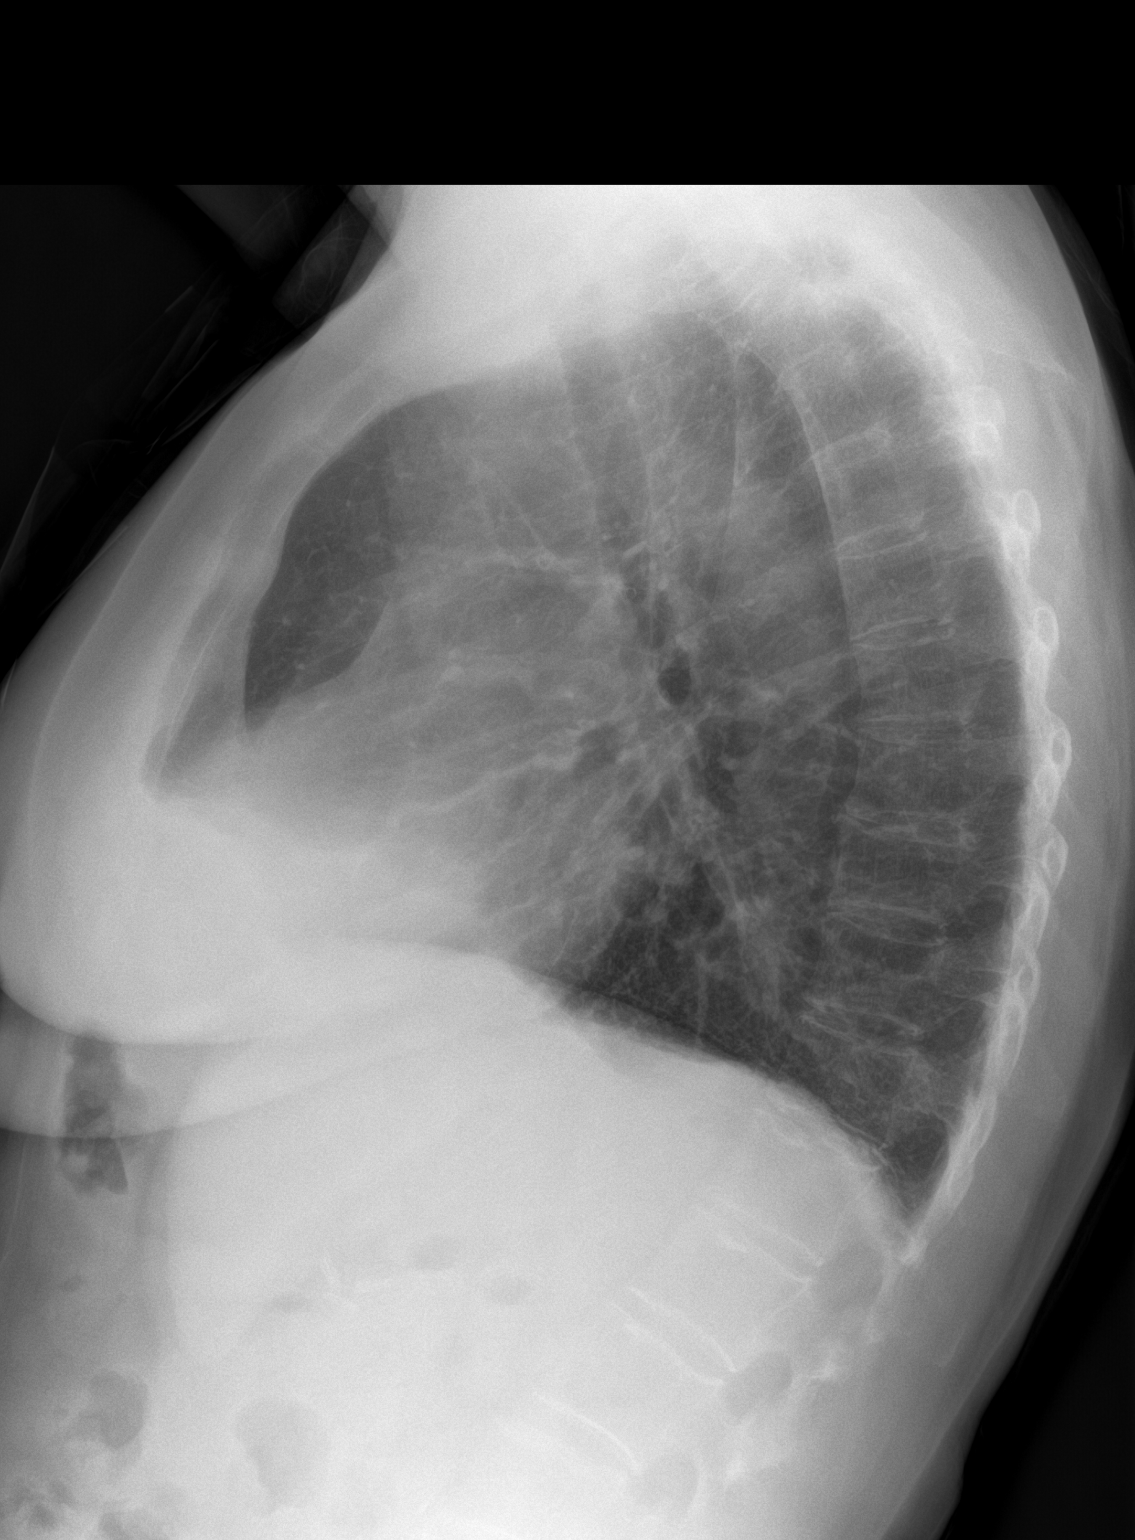

[2 of 2 positions shown; findings below may reference images not displayed]

FINDINGS: Heart is normal size. Prominent epicardial fat in the right
cardiophrenic angle, stable. No confluent airspace opacities or
effusions. There is hyperinflation of the lungs compatible with
COPD. Heart is normal size. No acute bony abnormality. No visible
displaced rib fracture. No pneumothorax.
IMPRESSION: COPD.

No active disease.

## 2023-11-02 NOTE — Patient Instructions (Incomplete)
1. Moderate persistent asthma,-moderately controlled - I will put in a referral to pulmonology due to your COPD - Recommend cutting back on your smoking and stopping. Information given - Daily controller medication(s): Singulair (montelukast) 10mg  daily + Breztri two puffs once daily with spacer + Fasenra every 8 weeks.   -During asthma flares/sickness increase Breztri to 2 puffs twice a day with spacer until symptoms return to baseline - Rescue medications: Xopenex 4 puffs every 4-6 hours as needed or Xopenex nebulizer one vial puffs every 4-6 hours as needed - Asthma control goals:  * Full participation in all desired activities (may need albuterol before activity) * Albuterol use two time or less a week on average (not counting use with activity) * Cough interfering with sleep two time or less a month * Oral steroids no more than once a year * No hospitalizations   2. Perennial allergic rhinitis-moderately controlled - Continue with shots every four weeks. Injection given today. - Continue with Singulair 10mg  daily. - Verbally instructed to cetirizine (Zyrtec) 10mg  daily to see if helps with the drainage you are having   3. Schedule a follow up appointment in 3-4 months or sooner if neeeded

## 2023-11-04 ENCOUNTER — Other Ambulatory Visit: Payer: Self-pay

## 2023-11-04 ENCOUNTER — Ambulatory Visit (INDEPENDENT_AMBULATORY_CARE_PROVIDER_SITE_OTHER): Payer: 59 | Admitting: Family

## 2023-11-04 ENCOUNTER — Encounter: Payer: Self-pay | Admitting: Family

## 2023-11-04 VITALS — BP 110/80 | HR 82 | Temp 98.3°F

## 2023-11-04 DIAGNOSIS — J3089 Other allergic rhinitis: Secondary | ICD-10-CM

## 2023-11-04 DIAGNOSIS — J302 Other seasonal allergic rhinitis: Secondary | ICD-10-CM

## 2023-11-04 DIAGNOSIS — Z720 Tobacco use: Secondary | ICD-10-CM | POA: Diagnosis not present

## 2023-11-04 DIAGNOSIS — J4489 Other specified chronic obstructive pulmonary disease: Secondary | ICD-10-CM | POA: Diagnosis not present

## 2023-11-04 DIAGNOSIS — Z91199 Patient's noncompliance with other medical treatment and regimen due to unspecified reason: Secondary | ICD-10-CM | POA: Diagnosis not present

## 2023-11-04 MED ORDER — CETIRIZINE HCL 10 MG PO TABS: ORAL_TABLET | ORAL | 3 refills | Status: AC

## 2023-11-04 MED ORDER — MONTELUKAST SODIUM 10 MG PO TABS: 10.0000 mg | ORAL_TABLET | Freq: Every day | ORAL | 1 refills | Status: DC

## 2023-11-04 MED ORDER — LEVALBUTEROL TARTRATE 45 MCG/ACT IN AERO: INHALATION_SPRAY | RESPIRATORY_TRACT | 1 refills | Status: DC

## 2023-11-04 MED ORDER — BREZTRI AEROSPHERE 160-9-4.8 MCG/ACT IN AERO: INHALATION_SPRAY | RESPIRATORY_TRACT | 2 refills | Status: DC

## 2023-11-04 NOTE — Addendum Note (Signed)
Addended by: Philipp Deputy on: 11/04/2023 04:50 PM   Modules accepted: Orders

## 2023-11-04 NOTE — Progress Notes (Signed)
[address] [place] [address] Dept: [phone]  FOLLOW UP NOTE  Patient ID: Lori [name], female    DOB: [birth year]  Age: 68 y.o. MRN: [id number] Date of Office Visit: 11/04/2023  Assessment  Chief Complaint: Asthma Harrington Challenger reapproval)  HPI Lori [name] is a 68 year old female who presents today for follow-up of moderate persistent asthma, cigarette smoker, seasonal and perennial allergic rhinitis, complex medical history, recent death of spouse 09/28/2020and brother March 2021.  She was last seen on September 05, 2022 by Dr. Dellis Anes.  She denies any new diagnosis or surgery since her last office visit, but mentions that she has fallen 4 times since her last office visit.  Also, her mother passed away this last month.  Moderate persistent asthma: She reports that she has been feeling fine and doing good.  She mentions that she will have wheezing off and on, but not all the time.  She also will have a dry cough every now and then.  She mentions that she has bronchitis, emphysema, and COPD.  She feels like the cough is due to drainage going down her throat.  She denies fever, chills, tightness in chest, shortness of breath, and nocturnal awakenings due to breathing problems.  She reports that she has never seen a pulmonologist.  She continues to smoke.  She is smoking half a pack per day.  She reports that she is smoking due to her "nerves being bad".  She is not interested in stopping at this time.  She continues to take Singulair 10 mg daily, Breztri 2 puffs once a day, and Fasenra every 8 weeks.  She has not had to use Xopenex.  She reports that she is using her medication daily as prescribed, however after reviewing epic it shows that her last Markus Daft was dispensed on June 30, 2023.    Perennial allergic rhinitis: She reports postnasal drip and she will sometimes have rhinorrhea and itchy watery eyes.  She denies nasal congestion.  She has not been treated for any  sinus infections since we last saw her.  She continues to take Singulair 10 mg once a day and has not been taking an antihistamine or cetirizine (Zyrtec) 10 mg daily.   Drug Allergies:  Allergies  Allergen Reactions   Advil [Ibuprofen] Nausea And Vomiting   Bee Venom Swelling   Codeine Nausea Only and Other (See Comments)    CAUSES ULCERS   Clonidine Derivatives Other (See Comments)    Unknown    Doxycycline Other (See Comments)    nausea   Statins Other (See Comments)    unknown   Latex Rash   Levaquin [Levofloxacin] Nausea Only   Propoxyphene N-Acetaminophen Nausea Only    Review of Systems: Negative except as per HPI   Physical Exam: BP 110/80   Pulse 82   Temp 98.3 F (36.8 C)   SpO2 95%    Physical Exam Constitutional:      Appearance: Normal appearance.  HENT:     Head: Normocephalic and atraumatic.     Comments: Pharynx normal, eyes normal, ears: Right ear normal, left ear unable to see tympanic membrane due to cerumen, nose normal    Right Ear: Tympanic membrane, ear canal and external ear normal.     Left Ear: Ear canal normal.     Nose: Nose normal.     Mouth/Throat:     Mouth: Mucous membranes are moist.     Pharynx: Oropharynx is clear.  Eyes:  Conjunctiva/sclera: Conjunctivae normal.  Cardiovascular:     Rate and Rhythm: Regular rhythm.     Heart sounds: Normal heart sounds.  Pulmonary:     Effort: Pulmonary effort is normal.     Breath sounds: Normal breath sounds.     Comments: Lungs clear to auscultation Musculoskeletal:     Cervical back: Neck supple.  Skin:    General: Skin is warm.  Neurological:     Mental Status: She is alert and oriented to person, place, and time.  Psychiatric:        Mood and Affect: Mood normal.        Behavior: Behavior normal.        Thought Content: Thought content normal.        Judgment: Judgment normal.     Diagnostics: FVC 2.31 L (78%), FEV1 1.57 L (68%), FEV1/FVC 0.68.  Spirometry indicates normal  spirometry-reduced FEV1.  Assessment and Plan: 1. Seasonal and perennial allergic rhinitis   2. Asthma-COPD overlap syndrome (HCC)   3. Tobacco abuse   4. Compliance poor     Meds ordered this encounter  Medications   Budeson-Glycopyrrol-Formoterol (BREZTRI AEROSPHERE) 160-9-4.8 MCG/ACT AERO    Sig: Inhale 2 puffs once a day with spacer to help prevent cough and wheeze.  Rinse mouth out afterwards.  During increased symptoms/sickness increase Breztri to 2 puffs twice a day with spacer until symptoms return to baseline    Dispense:  10.7 g    Refill:  2   montelukast (SINGULAIR) 10 MG tablet    Sig: Take 1 tablet (10 mg total) by mouth at bedtime.    Dispense:  90 tablet    Refill:  1    Requesting 1 year supply   cetirizine (ZYRTEC ALLERGY) 10 MG tablet    Sig: take1 tablet by mouth once a day as needed for runny nose/drainage down throat    Dispense:  30 tablet    Refill:  3   levalbuterol (XOPENEX HFA) 45 MCG/ACT inhaler    Sig: Inhale 2 puffs every 4-6 hours as needed for cough, wheeze, tightness in chest, or shortness of breath    Dispense:  1 each    Refill:  1    Patient Instructions  1. Moderate persistent asthma,-moderately controlled - I will put in a referral to pulmonology due to your COPD - Recommend cutting back on your smoking and stopping. Information given - Daily controller medication(s): Singulair (montelukast) 10mg  daily + Breztri two puffs once daily with spacer + Fasenra every 8 weeks.   -During asthma flares/sickness increase Breztri to 2 puffs twice a day with spacer until symptoms return to baseline - Rescue medications: Xopenex 4 puffs every 4-6 hours as needed or Xopenex nebulizer one vial puffs every 4-6 hours as needed - Asthma control goals:  * Full participation in all desired activities (may need albuterol before activity) * Albuterol use two time or less a week on average (not counting use with activity) * Cough interfering with sleep two time  or less a month * Oral steroids no more than once a year * No hospitalizations   2. Perennial allergic rhinitis-moderately controlled - Continue with shots every four weeks. Injection given today. - Continue with Singulair 10mg  daily. - Verbally instructed to cetirizine (Zyrtec) 10mg  daily to see if helps with the drainage you are having   3. Schedule a follow up appointment in 3-4 months or sooner if neeeded   Return in about 3 months (around 02/01/2024), or  if symptoms worsen or fail to improve.    Thank you for the opportunity to care for this patient.  Please do not hesitate to contact me with questions.  Nehemiah Settle, FNP [facility]

## 2023-11-12 ENCOUNTER — Ambulatory Visit (INDEPENDENT_AMBULATORY_CARE_PROVIDER_SITE_OTHER): Payer: 59

## 2023-11-12 VITALS — Ht 64.0 in | Wt 112.0 lb

## 2023-11-12 DIAGNOSIS — Z780 Asymptomatic menopausal state: Secondary | ICD-10-CM | POA: Diagnosis not present

## 2023-11-12 DIAGNOSIS — Z0000 Encounter for general adult medical examination without abnormal findings: Secondary | ICD-10-CM | POA: Diagnosis not present

## 2023-11-12 NOTE — Patient Instructions (Addendum)
 Lori [name] , Thank you for taking time to come for your Medicare Wellness Visit. I appreciate your ongoing commitment to your health goals. Please review the following plan we discussed and let me know if I can assist you in the future.   Referrals/Orders/Follow-Ups/Clinician Recommendations: It was nice talking to you today.  Each day, aim for 6 glasses of water, plenty of protein in your diet and try to get up and walk/ stretch every hour for 5-10 minutes at a time.  You have an order for:  [x]   Bone Density     Please call for appointment:  [facility] [address] [place], [place] [address] [phone]  Make sure to wear two-piece clothing.  No lotions, powders, or deodorants the day of the appointment. Make sure to bring picture ID and insurance card.  Bring list of medications you are currently taking including any supplements.   Schedule your [place] screening mammogram through MyChart!   Log into your MyChart account.  Go to 'Visit' (or 'Appointments' if on mobile App) --> Schedule an Appointment  Under 'Select a Reason for Visit' choose the Mammogram Screening option.  Complete the pre-visit questions and select the time and place that best fits your schedule.    This is a list of the screening recommended for you and due dates:  Health Maintenance  Topic Date Due   Zoster (Shingles) Vaccine (2 of 2) 02/09/2017   Mammogram  06/17/2018   DEXA scan (bone density measurement)  Never done   Colon Cancer Screening  08/30/2021   COVID-19 Vaccine (4 - 2024-25 season) 05/18/2023   Medicare Annual Wellness Visit  08/03/2023   Screening for Lung Cancer  05/13/2024   Pneumonia Vaccine (3 of 3 - PPSV23 or PCV20) 05/14/2026   DTaP/Tdap/Td vaccine (4 - Td or Tdap) 07/02/2032   Flu Shot  Completed   Hepatitis C Screening  Completed   HPV Vaccine  Aged Out    Advanced directives: (Copy Requested) Please bring a copy of your health care power of  attorney and living will to the office to be added to your chart at your convenience.  Next Medicare Annual Wellness Visit scheduled for next year: Yes

## 2023-11-12 NOTE — Progress Notes (Signed)
 Subjective:   Lori [name] is a 68 y.o. who presents for a Medicare Wellness preventive visit.  Visit Complete: Virtual I connected with  Lori [name] on 11/12/23 by a audio enabled telemedicine application and verified that I am speaking with the correct person using two identifiers.  Patient Location: Home  Provider Location: Home Office  I discussed the limitations of evaluation and management by telemedicine. The patient expressed understanding and agreed to proceed.  Vital Signs: Because this visit was a virtual/telehealth visit, some criteria may be missing or patient reported. Any vitals not documented were not able to be obtained and vitals that have been documented are patient reported.  VideoDeclined- This patient declined Librarian, academic. Therefore the visit was completed with audio only.  AWV Questionnaire: No: Patient Medicare AWV questionnaire was not completed prior to this visit.  Cardiac Risk Factors include: dyslipidemia;Other (see comment), Risk factor comments: COPD, Aortic atherosclerosis     Objective:    Today's Vitals   11/12/23 1010  Weight: 112 lb (50.8 kg)  Height: 5\' 4"  (1.626 m)   Body mass index is 19.22 kg/m.     11/12/2023   10:26 AM 09/29/2023   11:56 PM 05/14/2023   10:00 PM 11/07/2022   10:10 AM 09/11/2022    6:40 PM 09/07/2022    7:55 PM 08/02/2022    1:28 PM  Advanced Directives  Does Patient Have a Medical Advance Directive? No No No No No No No  Would patient like information on creating a medical advance directive?  No - Patient declined No - Patient declined Yes (MAU/Ambulatory/Procedural Areas - Information given)  No - Patient declined Yes (MAU/Ambulatory/Procedural Areas - Information given)    Current Medications (verified) Outpatient Encounter Medications as of 11/12/2023  Medication Sig   albuterol (PROAIR HFA) 108 (90 Base) MCG/ACT inhaler Inhale 2 puffs into the lungs every 6 (six)  hours as needed for wheezing or shortness of breath.   ALPRAZolam (XANAX) 1 MG tablet TAKE 1 TABLET BY MOUTH AT BEDTIME AS NEEDED FOR ANXIETY   Budeson-Glycopyrrol-Formoterol (BREZTRI AEROSPHERE) 160-9-4.8 MCG/ACT AERO Inhale 2 puffs once a day with spacer to help prevent cough and wheeze.  Rinse mouth out afterwards.  During increased symptoms/sickness increase Breztri to 2 puffs twice a day with spacer until symptoms return to baseline   cetirizine (ZYRTEC ALLERGY) 10 MG tablet take1 tablet by mouth once a day as needed for runny nose/drainage down throat   dexlansoprazole (DEXILANT) 60 MG capsule Take 1 capsule (60 mg total) by mouth daily.   EPINEPHrine 0.3 mg/0.3 mL IJ SOAJ injection Inject 0.3 mg into the muscle as needed for anaphylaxis. Do not refrigerate.   Erenumab-aooe 140 MG/ML SOAJ Inject 140 mg into the skin every 30 (thirty) days.   FASENRA 30 MG/ML prefilled syringe INJECT 30MG  SUBCUTANEOUSLY EVERY 8 WEEKS   FLUoxetine (PROZAC) 20 MG capsule Take 1 capsule (20 mg total) by mouth daily.   gabapentin (NEURONTIN) 600 MG tablet Take 600 mg by mouth 2 (two) times daily.   levalbuterol (XOPENEX HFA) 45 MCG/ACT inhaler Inhale 2 puffs every 4-6 hours as needed for cough, wheeze, tightness in chest, or shortness of breath   meclizine (ANTIVERT) 12.5 MG tablet Take 1 tablet (12.5 mg total) by mouth 3 (three) times daily as needed for dizziness.   montelukast (SINGULAIR) 10 MG tablet Take 1 tablet (10 mg total) by mouth at bedtime.   Naloxone HCl 4 MG/0.25ML LIQD Place 1 spray into  the nose as needed.   nitroGLYCERIN (NITROSTAT) 0.4 MG SL tablet Place 1 tablet (0.4 mg total) under the tongue every 5 (five) minutes as needed for chest pain.   nystatin cream (MYCOSTATIN) Apply 1 Application topically 2 (two) times daily.   promethazine (PHENERGAN) 12.5 MG tablet Take 1 tablet (12.5 mg total) by mouth every 8 (eight) hours as needed for nausea or vomiting.   tiZANidine (ZANAFLEX) 2 MG tablet Take  1 tablet (2 mg total) by mouth every 8 (eight) hours as needed for muscle spasms.   traMADol (ULTRAM) 50 MG tablet Take 1 tablet (50 mg total) by mouth every 6 (six) hours as needed.   valACYclovir (VALTREX) 500 MG tablet Take 1 tablet (500 mg total) by mouth 2 (two) times daily.   zolpidem (AMBIEN) 5 MG tablet Take 1 tablet (5 mg total) by mouth at bedtime as needed for sleep.   Facility-Administered Encounter Medications as of 11/12/2023  Medication   Benralizumab SOSY 30 mg    Allergies (verified) Advil [ibuprofen], Bee venom, Codeine, Clonidine derivatives, Doxycycline, Statins, Latex, Levaquin [levofloxacin], and Propoxyphene n-acetaminophen   History: Past Medical History:  Diagnosis Date   Allergic rhinitis    Anemia    hgb 13.9 on 11/22/21   Ankle fracture 05/2016   Anxiety    Arthritis    Asthma    Barrett esophagus 2007   Chest pain    Hx, no current problems as of 11/22/21.  Recurrent episodes.  Has had 3 negative nuclear stress test, and a completely normal coronary CT angiogram   COPD (chronic obstructive pulmonary disease) with chronic bronchitis (HCC)    & Emphysema   Depression    Duodenitis without mention of hemorrhage 2007   Esophageal reflux 2007   Esophageal stricture    Full dentures    patient does not wear dentures as of 11/22/21   H/O hiatal hernia    Hiatal hernia 2006,2007   HSV infection    Hyperlipidemia    rx crestor but pt not taking   Multiple fractures    from falls, fx rt. elbow, fx left wrist, bilateral ankles   Pneumonia 10/2016   Restrictive lung disease 06/30/2017   Ulcer    no current problems as of 11/22/21   Past Surgical History:  Procedure Laterality Date   ABDOMINAL HYSTERECTOMY     BSO   APPENDECTOMY     BIOPSY  04/04/2022   Procedure: BIOPSY;  Surgeon: Lemar Lofty., MD;  Location: [facility];  Service: Gastroenterology;;   CESAREAN SECTION     x 2   CHOLECYSTECTOMY     CHONDROPLASTY Left 01/06/2015   Procedure:  CHONDROPLASTY;  Surgeon: Eldred Manges, MD;  Location: [facility];  Service: Orthopedics;  Laterality: Left;   COLONOSCOPY     CT CTA CORONARY W/CA SCORE W/CM &/OR WO/CM  12/02/2016   CORONARY CALCIUM SCORE 0.  NO EVIDENCE OF CAD.  Normal-sized PA.  Normal pulmonary venous drainage the left atrium.  No ASD or VSD.   ELBOW FRACTURE SURGERY     right   ESOPHAGOGASTRODUODENOSCOPY (EGD) WITH PROPOFOL N/A 04/04/2022   Procedure: ESOPHAGOGASTRODUODENOSCOPY (EGD) WITH PROPOFOL;  Surgeon: Meridee Score Netty Starring., MD;  Location: [facility];  Service: Gastroenterology;  Laterality: N/A;   EUS N/A 04/04/2022   Procedure: UPPER ENDOSCOPIC ULTRASOUND (EUS) RADIAL;  Surgeon: Lemar Lofty., MD;  Location: [facility] END[facility];  Service: Gastroenterology;  Laterality: N/A;   EYE SURGERY Bilateral    cataracts removed  INSERTION OF MESH N/A 11/12/2022   Procedure: INSERTION OF MESH;  Surgeon: Axel Filler, MD;  Location: [facility] OR;  Service: General;  Laterality: N/A;   KNEE ARTHROSCOPY WITH EXCISION PLICA Left 01/06/2015   Procedure: KNEE ARTHROSCOPY WITH EXCISION PLICA;  Surgeon: Eldred Manges, MD;  Location: [facility];  Service: Orthopedics;  Laterality: Left;   LIGAMENT REPAIR Right 11/28/2021   Procedure: RIGHT THUMB LIGAMENT RECONSTRUCTION AND TENDON INTERPOSITION;  Surgeon: Marlyne Beards, MD;  Location: [facility] OR;  Service: Orthopedics;  Laterality: Right;   Lower Extremity Arterial Dopplers  07/06/2013   RABI 1.0, LABI 1.1.; No evidence of significant vascular atherogenic plaque   NECK SURGERY     NM MYOVIEW LTD  09/2014   a) LOW RISK. Normal EF of 65%, no RWMA. NO ISCHEMIA OR INFARCTION.;; b) 05/02/2009: Normal study.  LOW RISK.  No ischemia or infarction.  EF 74%.  Breast attenuation noted.   TONSILLECTOMY     TRANSTHORACIC ECHOCARDIOGRAM  03/28/2021   a) Normal EF 60 to 65%.  GR 1 DD.  No R WMA.  Normal RV size and function.  Normal RAP.  Normal valves. => NORMAL;; b)  04/23/16/2010: Normal study.  LOW RISK.  No ischemia or infarction.  EF 74%.  Breast attenuation noted.17/2010: ([facility]) EF>55%.  Normal wall motion.  Normal valves.   WRIST FRACTURE SURGERY     left, has plate   XI ROBOTIC ASSISTED PARAESOPHAGEAL HERNIA REPAIR N/A 11/12/2022   Procedure: ROBOTIC DIAPHRAGMATIC HERNIA REPAIR WITH MESH;  Surgeon: Axel Filler, MD;  Location[facility];  Service: General;  Laterality: N/A;   Family History  Problem Relation Age of Onset   Emphysema Father    Dementia Mother    Diabetes Maternal Grandmother    Diabetes Maternal Aunt    Diabetes Other        mat. cousin   Heart disease Brother    Colon cancer Neg Hx    Allergic rhinitis Neg Hx    Angioedema Neg Hx    Asthma Neg Hx    Atopy Neg Hx    Eczema Neg Hx    Immunodeficiency Neg Hx    Urticaria Neg Hx    Social History   Socioeconomic History   Marital status: Widowed    Spouse name: Casimiro Needle   Number of children: 2   Years of education: HS   Highest education level: Not on file  Occupational History   Occupation: disable  Tobacco Use   Smoking status: Every Day    Current packs/day: 0.50    Average packs/day: 0.5 packs/day for 46.3 years (23.1 ttl pk-yrs)    Types: Cigarettes    Start date: 08/06/1977   Smokeless tobacco: Never   Tobacco comments:    Patient has cut back to 2-3 per day as of 11/22/21  Vaping Use   Vaping status: Never Used  Substance and Sexual Activity   Alcohol use: No    Alcohol/week: 0.0 standard drinks of alcohol   Drug use: No   Sexual activity: Not Currently    Birth control/protection: Surgical    Comment: Hysterectomy  Other Topics Concern   Not on file  Social History Narrative   Patient lives at home spouse - Kathlene November (Deceased).  Has 2 daughters (10 and 43).   Currently "disabled".   Caffeine Use: tea 4 glasses daily.    Smokes ~1/2 PPD  05/15/15   Walks everyday - no set routine.      [facility] (  12/03/16):   Originally from [facility]. 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 Currently has a dog. No bird exposure. No known mold exposure.    Patient is currently disabled.      Lives alone.   Social Drivers of Corporate investment banker Strain: Low Risk  (11/12/2023)   Overall Financial Resource Strain (CARDIA)    Difficulty of Paying Living Expenses: Not very hard  Food Insecurity: No Food Insecurity (11/12/2023)   Hunger Vital Sign    Worried About Running Out of Food in the Last Year: Never true    Ran Out of Food in the Last Year: Never true  Transportation Needs: No Transportation Needs (11/12/2023)   PRAPARE - Administrator, Civil Service (Medical): No    Lack of Transportation (Non-Medical): No  Physical Activity: Inactive (11/12/2023)   Exercise Vital Sign    Days of Exercise per Week: 0 days    Minutes of Exercise per Session: 0 min  Stress: Stress Concern Present (11/12/2023)   [employer] of Occupational Health - Occupational Stress Questionnaire    Feeling of Stress : Rather much  Social Connections: Moderately Isolated (11/12/2023)   Social Connection and Isolation Panel [NHANES]    Frequency of Communication with Friends and Family: More than three times a week    Frequency of Social Gatherings with Friends and Family: Not on file    Attends Religious Services: 1 to 4 times per year    Active Member of [employer] or Organizations: No    Attends Banker Meetings: Never    Marital Status: Widowed    Tobacco Counseling Ready to quit: Not Answered Counseling given: Not Answered Tobacco comments: Patient has cut back to 2-3 per day as of 11/22/21    Clinical Intake:  Pre-visit preparation completed: Yes  Pain : No/denies pain     BMI - recorded: 19.22 Nutritional Status: BMI of 19-24  Normal Nutritional Risks: None Diabetes: No  How often do you need to have someone help you when you read instructions, pamphlets, or other written materials from your doctor  or pharmacy?: 1 - Never  Interpreter Needed?: No  Information entered by :: Genevieve Arbaugh, RMA   Activities of Daily Living     11/12/2023   10:11 AM 05/14/2023   10:00 PM  In your present state of health, do you have any difficulty performing the following activities:  Hearing? 0 0  Vision? 0 0  Difficulty concentrating or making decisions? 0 0  Walking or climbing stairs? 0 0  Dressing or bathing? 0 0  Doing errands, shopping? 0 0  Comment friends drive her around.   Preparing Food and eating ? N   Using the Toilet? N   In the past six months, have you accidently leaked urine? N   Do you have problems with loss of bowel control? N   Managing your Medications? N   Managing your Finances? N   Housekeeping or managing your Housekeeping? N     Patient Care Team: Corwin Levins, MD as PCP - General (Internal Medicine) [name], MD as PCP - Cardiology (Cardiology) Jearld Lesch, MD as Referring Physician (Specialist) Jearld Lesch, MD as Referring Physician (Specialist) Jearld Lesch, MD as Referring Physician (Specialist) Myrtie Neither Andreas Blower, MD as Consulting Physician (Gastroenterology) Santiago Glad, MD as Referring Physician (Specialist) Alfonse Spruce, MD as Consulting Physician (Allergy and Immunology) Rodolph Bong, MD as Referring Physician (  Family Medicine)  Indicate any recent Medical Services you may have received from other than [facility] providers in the past year (date may be approximate).     Assessment:   This is a routine wellness examination for [place].  Hearing/Vision screen Hearing Screening - Comments:: Denies hearing difficulties   Vision Screening - Comments:: Wears eyeglasses   Goals Addressed   None    Depression Screen     11/12/2023   10:33 AM 04/30/2023   10:32 AM 04/09/2023    9:51 AM 01/01/2023    8:51 AM 09/19/2022   10:42 AM 09/19/2022   10:22 AM 08/02/2022    1:10 PM  PHQ 2/9 Scores  PHQ - 2 Score 3 1 2  0 1  0 2  PHQ- 9 Score 8 5 6   8 7     Fall Risk     11/12/2023   10:27 AM 09/19/2023   10:21 AM 04/30/2023   10:31 AM 04/09/2023    9:51 AM 01/01/2023    8:51 AM  Fall Risk   Falls in the past year? 1 0 0 0 0  Number falls in past yr: 1 0 0 0 0  Injury with Fall? 1 0 0 0 0  Risk for fall due to :  Impaired mobility No Fall Risks No Fall Risks No Fall Risks  Risk for fall due to: Comment  Notes weakness in legs from time to time     Follow up Falls evaluation completed;Falls prevention discussed Falls evaluation completed Falls evaluation completed Falls evaluation completed Falls evaluation completed    MEDICARE RISK AT HOME:  Medicare Risk at Home Any stairs in or around the home?: Yes If so, are there any without handrails?: Yes Home free of loose throw rugs in walkways, pet beds, electrical cords, etc?: Yes Adequate lighting in your home to reduce risk of falls?: Yes Life alert?: No Use of a cane, walker or w/c?: Yes Grab bars in the bathroom?: No Shower chair or bench in shower?: No Elevated toilet seat or a handicapped toilet?: No  TIMED UP AND GO:  Was the test performed?  No  Cognitive Function: 6CIT completed    07/02/2017    8:48 AM  MMSE - Mini Mental State Exam  Orientation to time 5  Orientation to Place 5  Registration 3  Attention/ Calculation 3  Recall 2  Language- name 2 objects 2  Language- repeat 1  Language- follow 3 step command 3  Language- read & follow direction 1  Write a sentence 1  Copy design 1  Total score 27        11/12/2023   10:29 AM 08/02/2022    1:21 PM 08/15/2020   11:15 AM  6CIT Screen  What Year? 0 points 0 points 0 points  What month? 0 points 0 points 0 points  What time? 0 points 0 points 0 points  Count back from 20 0 points 0 points 0 points  Months in reverse 4 points 0 points 0 points  Repeat phrase 2 points 0 points 0 points  Total Score 6 points 0 points 0 points    Immunizations Immunization History   Administered Date(s) Administered   Fluad Quad(high Dose 65+) 05/29/2022   Fluad Trivalent(High Dose 65+) 06/09/2023   Influenza,inj,Quad PF,6+ Mos 12/03/2016, 05/18/2017, 05/20/2018, 05/25/2019   Influenza,inj,Quad PF,6-35 Mos 05/18/2019   Influenza-Unspecified 05/18/2017, 07/17/2018, 05/14/2021   PFIZER(Purple Top)SARS-COV-2 Vaccination 10/18/2019, 11/08/2019, 06/19/2020   Pneumococcal Conjugate-13 12/30/2014, 12/31/2016   Pneumococcal Polysaccharide-23  01/17/2014, 12/31/2017, 05/14/2021   Tdap 09/09/2013, 05/10/2016, 07/02/2022   Zoster Recombinant(Shingrix) 12/15/2016   Zoster, Live 08/31/2013    Screening Tests Health Maintenance  Topic Date Due   Zoster Vaccines- Shingrix (2 of 2) 02/09/2017   MAMMOGRAM  06/17/2018   DEXA SCAN  Never done   COVID-19 Vaccine (4 - 2024-25 season) 05/18/2023   Medicare Annual Wellness (AWV)  08/03/2023   Lung Cancer Screening  05/13/2024   Pneumonia Vaccine 39+ Years old (3 of 3 - PPSV23 or PCV20) 05/14/2026   Colonoscopy  04/05/2027   DTaP/Tdap/Td (4 - Td or Tdap) 07/02/2032   INFLUENZA VACCINE  Completed   Hepatitis C Screening  Completed   HPV VACCINES  Aged Out    Health Maintenance  Health Maintenance Due  Topic Date Due   Zoster Vaccines- Shingrix (2 of 2) 02/09/2017   MAMMOGRAM  06/17/2018   DEXA SCAN  Never done   COVID-19 Vaccine (4 - 2024-25 season) 05/18/2023   Medicare Annual Wellness (AWV)  08/03/2023   Health Maintenance Items Addressed: DEXA ordered  Additional Screening:  Vision Screening: Recommended annual ophthalmology exams for early detection of glaucoma and other disorders of the eye.  Dental Screening: Recommended annual dental exams for proper oral hygiene  Community Resource Referral / Chronic Care Management: CRR required this visit?  No   CCM required this visit?  No     Plan:     I have personally reviewed and noted the following in the patient's chart:   Medical and social history Use of  alcohol, tobacco or illicit drugs  Current medications and supplements including opioid prescriptions. Patient is currently taking opioid prescriptions. Information provided to patient regarding non-opioid alternatives. Patient advised to discuss non-opioid treatment plan with their provider. Functional ability and status Nutritional status Physical activity Advanced directives List of other physicians Hospitalizations, surgeries, and ER visits in previous 12 months Vitals Screenings to include cognitive, depression, and falls Referrals and appointments  In addition, I have reviewed and discussed with patient certain preventive protocols, quality metrics, and best practice recommendations. A written personalized care plan for preventive services as well as general preventive health recommendations were provided to patient.     Mariza Bourget L Odeal Welden, CMA   11/12/2023   After Visit Summary: (Mail) Due to this being a telephonic visit, the after visit summary with patients personalized plan was offered to patient via mail   Notes: Please refer to Routing Comments.

## 2023-11-13 ENCOUNTER — Telehealth: Payer: Self-pay

## 2023-11-13 DIAGNOSIS — G43719 Chronic migraine without aura, intractable, without status migrainosus: Secondary | ICD-10-CM | POA: Diagnosis not present

## 2023-11-13 NOTE — Telephone Encounter (Unsigned)
 Copied from CRM [id number]. Topic: Clinical - Request for Lab/Test Order >> Nov 13, 2023  2:30 PM Deaijah H wrote: Reason for CRM: Patient called in stating Dr. Jonny Ruiz was suppose order a bone and lung test but never heard from anyone regarding an appointment. Please call [phone]

## 2023-11-14 NOTE — Telephone Encounter (Signed)
 Bone density was ordered feb 26  Please assist pt in making an appt for the DXA to be done at [facility]

## 2023-11-18 ENCOUNTER — Telehealth: Payer: Self-pay | Admitting: Allergy & Immunology

## 2023-11-18 NOTE — Telephone Encounter (Signed)
 Patient has been referred to [facility].  This is an internal referral.  Once the referral populates in their WQ, they will call her to schedule.  [facility] [address] [place], [place] [address]  [phone]  Lori [name] has been informed

## 2023-11-19 ENCOUNTER — Other Ambulatory Visit: Payer: Self-pay | Admitting: Internal Medicine

## 2023-11-19 NOTE — Telephone Encounter (Signed)
 Copied from CRM [id number]. Topic: Clinical - Medication Refill >> Nov 19, 2023  1:55 PM Almira Coaster wrote: Most Recent Primary Care Visit:  Provider: Wyvonne Lenz  Department: [facility]  Visit Type: ANNUAL WELL VISIT, SEQUENTIAL  Date: 11/12/2023  Medication: ALPRAZolam Prudy Feeler) 1 MG tablet  Has the patient contacted their pharmacy? Yes, they faxed a request to the doctor's office. (Agent: If no, request that the patient contact the pharmacy for the refill. If patient does not wish to contact the pharmacy document the reason why and proceed with request.) (Agent: If yes, when and what did the pharmacy advise?)  Is this the correct pharmacy for this prescription? Yes If no, delete pharmacy and type the correct one.  This is the patient's preferred pharmacy:  [facility] [address], [place] - [address] [place] [address] Phone: [phone] Fax: [phone]    Has the prescription been filled recently? No  Is the patient out of the medication? Yes  Has the patient been seen for an appointment in the last year OR does the patient have an upcoming appointment? Yes  Can we respond through MyChart? No  Agent: Please be advised that Rx refills may take up to 3 business days. We ask that you follow-up with your pharmacy.

## 2023-12-01 ENCOUNTER — Telehealth: Payer: Self-pay

## 2023-12-01 ENCOUNTER — Ambulatory Visit: Payer: Self-pay | Admitting: Internal Medicine

## 2023-12-01 MED ORDER — TIZANIDINE HCL 2 MG PO TABS: 40.0000 mg | ORAL_TABLET | Freq: Three times a day (TID) | ORAL | 1 refills | Status: DC | PRN

## 2023-12-01 NOTE — Telephone Encounter (Signed)
 Ok for refil zanaflex prn - done erx

## 2023-12-01 NOTE — Telephone Encounter (Signed)
 Reason for Disposition . Caused by a twisting, bending, or lifting injury  Protocols used: Back Pain-A-AH

## 2023-12-01 NOTE — Telephone Encounter (Signed)
 Copied from CRM [id number]. Topic: Clinical - Prescription Issue >> Dec 01, 2023  2:20 PM [place] E wrote: Reason for CRM: [facility] pharmacy called in wanting clarification on a prescription that was received today for the patient, tiZANidine (ZANAFLEX) 2 MG tablet. Pharmacist stated that the orders read, "Take 20 tablets (40 mg total) by mouth every 8 (eight) hours as needed for muscle spasms." Wanting PCP to address this order and correct dosage.

## 2023-12-01 NOTE — Telephone Encounter (Signed)
 Copied from CRM [id number]. Topic: Clinical - Red Word Triage >> Dec 01, 2023 12:22 PM [place] wrote: Red Word that prompted transfer to Nurse Triage: Patient has a stinging and burning sensation in the middle of her back. Patient fell a month ago. She did go to the hospital. She wants to get some pain medication.  Chief Complaint: back pain  Symptoms: 10/10 mid low back pain that radiates to bilateral legs: has burning and stinging sensation that radiates from to bilateral legs, muscle spasms -"like knot in back"  Frequency: begin flare up last week Pertinent Negatives: Patient denies fever Disposition: [] ED /[] Urgent Care (no appt availability in office) / [x] Appointment(In office/virtual)/ []  [facility]/ [] Home Care/ [] Refused Recommended Disposition /[] [facility]/ []  Follow-up with PCP Additional Notes: fell 4x last month  due to unbalance now has cane. Muscle spasms come and go - right now out of medication that helps control. Pt c/o could hardly get up this morning.  Answer Assessment - Initial Assessment Questions 1. ONSET: "When did the pain begin?"      Couple of years - flares up from time to time 2. LOCATION: "Where does it hurt?" (upper, mid or lower back)     Middle of lower 3. SEVERITY: "How bad is the pain?"  (e.g., Scale 1-10; mild, moderate, or severe)   - MILD (1-3): Doesn't interfere with normal activities.    - MODERATE (4-7): Interferes with normal activities or awakens from sleep.    - SEVERE (8-10): Excruciating pain, unable to do any normal activities.      10/10 stinging and burning 4. PATTERN: "Is the pain constant?" (e.g., yes, no; constant, intermittent)      intermittent 5. RADIATION: "Does the pain shoot into your legs or somewhere else?"     Bilateral leg and feet has burning and burning and draws up 6. CAUSE:  "What do you think is causing the back pain?"      Muscle pain 7. BACK OVERUSE:  "Any recent lifting of heavy objects,  strenuous work or exercise?"     no 8. MEDICINES: "What have you taken so far for the pain?" (e.g., nothing, acetaminophen, NSAIDS)     Advil pm without relief 9. NEUROLOGIC SYMPTOMS: "Do you have any weakness, numbness, or problems with bowel/bladder control?"     no 10. OTHER SYMPTOMS: "Do you have any other symptoms?" (e.g., fever, abdomen pain, burning with urination, blood in urine)       no 11. PREGNANCY: "Is there any chance you are pregnant?" "When was your last menstrual period?"       N/a  Protocols used: Back Pain-A-AH

## 2023-12-02 ENCOUNTER — Ambulatory Visit (INDEPENDENT_AMBULATORY_CARE_PROVIDER_SITE_OTHER): Admitting: *Deleted

## 2023-12-02 DIAGNOSIS — J309 Allergic rhinitis, unspecified: Secondary | ICD-10-CM | POA: Diagnosis not present

## 2023-12-02 MED ORDER — TIZANIDINE HCL 2 MG PO TABS: 2.0000 mg | ORAL_TABLET | Freq: Three times a day (TID) | ORAL | 1 refills | Status: DC | PRN

## 2023-12-06 ENCOUNTER — Other Ambulatory Visit: Payer: Self-pay | Admitting: Internal Medicine

## 2023-12-08 ENCOUNTER — Other Ambulatory Visit: Payer: Self-pay

## 2023-12-09 ENCOUNTER — Other Ambulatory Visit: Payer: Self-pay | Admitting: Internal Medicine

## 2023-12-09 NOTE — Telephone Encounter (Signed)
 Copied from CRM [id number]. Topic: Clinical - Medication Refill >> Dec 09, 2023  1:40 PM Adaysia C wrote: Most Recent Primary Care Visit:  Provider: Wyvonne Lenz  Department: [facility]  Visit Type: ANNUAL WELL VISIT, SEQUENTIAL  Date: 11/12/2023  Medication: zolpidem (AMBIEN) 5 MG tablet  Has the patient contacted their pharmacy? No, patient contacted provider to initiate RX refill (Agent: If no, request that the patient contact the pharmacy for the refill. If patient does not wish to contact the pharmacy document the reason why and proceed with request.) (Agent: If yes, when and what did the pharmacy advise?)  Is this the correct pharmacy for this prescription? Yes If no, delete pharmacy and type the correct one.  This is the patient's preferred pharmacy:  [facility] [address] [place] - [address] [place] [address] Phone: [phone] Fax: [phone]    Has the prescription been filled recently? No  Is the patient out of the medication? Yes  Has the patient been seen for an appointment in the last year OR does the patient have an upcoming appointment?  Yes Can we respond through MyChart? No  Agent: Please be advised that Rx refills may take up to 3 business days. We ask that you follow-up with your pharmacy.

## 2023-12-10 MED ORDER — ZOLPIDEM TARTRATE 5 MG PO TABS: 5.0000 mg | ORAL_TABLET | Freq: Every evening | ORAL | 1 refills | Status: DC | PRN

## 2023-12-17 DIAGNOSIS — G43719 Chronic migraine without aura, intractable, without status migrainosus: Secondary | ICD-10-CM | POA: Diagnosis not present

## 2023-12-19 ENCOUNTER — Ambulatory Visit (INDEPENDENT_AMBULATORY_CARE_PROVIDER_SITE_OTHER)
Admission: RE | Admit: 2023-12-19 | Discharge: 2023-12-19 | Disposition: A | Discharge status: Home / Self Care | Source: Ambulatory Visit | Attending: Internal Medicine | Admitting: Internal Medicine

## 2023-12-19 DIAGNOSIS — Z780 Asymptomatic menopausal state: Secondary | ICD-10-CM

## 2023-12-22 ENCOUNTER — Ambulatory Visit: Payer: 59

## 2023-12-22 ENCOUNTER — Encounter: Payer: Self-pay | Admitting: Internal Medicine

## 2023-12-22 DIAGNOSIS — J4550 Severe persistent asthma, uncomplicated: Secondary | ICD-10-CM | POA: Diagnosis not present

## 2023-12-24 ENCOUNTER — Telehealth: Payer: Self-pay | Admitting: Internal Medicine

## 2023-12-24 NOTE — Telephone Encounter (Signed)
 Pt called wanting to follow up on her results. Dr. Jonny Ruiz is requesting for the pt to start on the prolia shot. Please advise.

## 2023-12-24 NOTE — Telephone Encounter (Unsigned)
 Copied from CRM [id number]. Topic: Clinical - Lab/Test Results >> Dec 24, 2023 10:50 AM Cammy Copa D wrote: Reason for CRM: Pt calling in regards to bone test done on 04/04, has not yet received results.

## 2023-12-25 ENCOUNTER — Other Ambulatory Visit: Payer: Self-pay

## 2023-12-25 DIAGNOSIS — Q782 Osteopetrosis: Secondary | ICD-10-CM

## 2023-12-25 MED ORDER — DENOSUMAB 60 MG/ML ~~LOC~~ SOSY: 60.0000 mg | PREFILLED_SYRINGE | Freq: Once | SUBCUTANEOUS | Status: DC

## 2023-12-25 NOTE — Telephone Encounter (Signed)
 Copied from CRM [id number]. Topic: Clinical - Lab/Test Results >> Dec 25, 2023 10:38 AM Cammy Copa D wrote: Reason for CRM: Pt called for bone density test results, communication shows results already relayed to pt. Patient says she still hasn't received a call back.

## 2023-12-25 NOTE — Telephone Encounter (Signed)
Results was given to Pt

## 2023-12-25 NOTE — Telephone Encounter (Unsigned)
 Patient has questions about her bone density report and the need to start Prolia. How the medication makes things better. Paitent has appt on 4/14 with Dr. Jonny Ruiz told to discuss with provider during that visit. Prolia ordered to start PA process. Informed patient we well let her know once all is complete if she wants to keep going the process

## 2023-12-25 NOTE — Telephone Encounter (Signed)
 Copied from CRM [id number]. Topic: General - Call Back - No Documentation >> Dec 25, 2023  2:34 PM Florestine Avers wrote: Reason for CRM: Patient said she had a missed call and requesting call back, she is unsure as to what it is pertaining to.

## 2023-12-25 NOTE — Telephone Encounter (Signed)
 Called and left voice mail

## 2023-12-26 ENCOUNTER — Telehealth: Payer: Self-pay

## 2023-12-26 NOTE — Telephone Encounter (Signed)
 Patient called due to receiving a bill for $99 for her injections. She stated she never received a bills, her insurance always covers the cost. Pleas advise. She said it was for both the [facility] and allergy injections.   Please call her back to go over billing information [phone]

## 2023-12-29 ENCOUNTER — Encounter: Payer: Self-pay | Admitting: Internal Medicine

## 2023-12-29 ENCOUNTER — Ambulatory Visit (INDEPENDENT_AMBULATORY_CARE_PROVIDER_SITE_OTHER)

## 2023-12-29 ENCOUNTER — Ambulatory Visit: Payer: 59 | Admitting: Internal Medicine

## 2023-12-29 VITALS — BP 122/74 | HR 65 | Temp 97.7°F | Ht 64.0 in | Wt 115.0 lb

## 2023-12-29 DIAGNOSIS — Z043 Encounter for examination and observation following other accident: Secondary | ICD-10-CM | POA: Diagnosis not present

## 2023-12-29 DIAGNOSIS — M5441 Lumbago with sciatica, right side: Secondary | ICD-10-CM

## 2023-12-29 DIAGNOSIS — E538 Deficiency of other specified B group vitamins: Secondary | ICD-10-CM

## 2023-12-29 DIAGNOSIS — E785 Hyperlipidemia, unspecified: Secondary | ICD-10-CM | POA: Diagnosis not present

## 2023-12-29 DIAGNOSIS — Z0000 Encounter for general adult medical examination without abnormal findings: Secondary | ICD-10-CM | POA: Diagnosis not present

## 2023-12-29 DIAGNOSIS — M810 Age-related osteoporosis without current pathological fracture: Secondary | ICD-10-CM | POA: Diagnosis not present

## 2023-12-29 DIAGNOSIS — M5136 Other intervertebral disc degeneration, lumbar region with discogenic back pain only: Secondary | ICD-10-CM | POA: Diagnosis not present

## 2023-12-29 DIAGNOSIS — D509 Iron deficiency anemia, unspecified: Secondary | ICD-10-CM

## 2023-12-29 DIAGNOSIS — R739 Hyperglycemia, unspecified: Secondary | ICD-10-CM | POA: Diagnosis not present

## 2023-12-29 DIAGNOSIS — J309 Allergic rhinitis, unspecified: Secondary | ICD-10-CM

## 2023-12-29 DIAGNOSIS — E559 Vitamin D deficiency, unspecified: Secondary | ICD-10-CM

## 2023-12-29 DIAGNOSIS — M48061 Spinal stenosis, lumbar region without neurogenic claudication: Secondary | ICD-10-CM | POA: Diagnosis not present

## 2023-12-29 DIAGNOSIS — M5442 Lumbago with sciatica, left side: Secondary | ICD-10-CM

## 2023-12-29 DIAGNOSIS — Z0001 Encounter for general adult medical examination with abnormal findings: Secondary | ICD-10-CM

## 2023-12-29 DIAGNOSIS — M545 Low back pain, unspecified: Secondary | ICD-10-CM | POA: Insufficient documentation

## 2023-12-29 LAB — CBC WITH DIFFERENTIAL/PLATELET
Basophils Absolute: 0 10*3/uL (ref 0.0–0.1)
Basophils Relative: 0.1 % (ref 0.0–3.0)
Eosinophils Absolute: 0 10*3/uL (ref 0.0–0.7)
Eosinophils Relative: 0 % (ref 0.0–5.0)
HCT: 38.4 % (ref 36.0–46.0)
Hemoglobin: 13.2 g/dL (ref 12.0–15.0)
Lymphocytes Relative: 30.6 % (ref 12.0–46.0)
Lymphs Abs: 2.5 10*3/uL (ref 0.7–4.0)
MCHC: 34.3 g/dL (ref 30.0–36.0)
MCV: 91.4 fl (ref 78.0–100.0)
Monocytes Absolute: 0.7 10*3/uL (ref 0.1–1.0)
Monocytes Relative: 9.1 % (ref 3.0–12.0)
Neutro Abs: 5 10*3/uL (ref 1.4–7.7)
Neutrophils Relative %: 60.2 % (ref 43.0–77.0)
Platelets: 267 10*3/uL (ref 150.0–400.0)
RBC: 4.2 Mil/uL (ref 3.87–5.11)
RDW: 13.7 % (ref 11.5–15.5)
WBC: 8.3 10*3/uL (ref 4.0–10.5)

## 2023-12-29 LAB — LIPID PANEL
Cholesterol: 264 mg/dL — ABNORMAL HIGH (ref 0–200)
HDL: 42 mg/dL (ref >39.00)
LDL Cholesterol: 159 mg/dL — ABNORMAL HIGH (ref 0–99)
NonHDL: 221.66
Total CHOL/HDL Ratio: 6
Triglycerides: 312 mg/dL — ABNORMAL HIGH (ref 0.0–149.0)
VLDL: 62.4 mg/dL — ABNORMAL HIGH (ref 0.0–40.0)

## 2023-12-29 LAB — HEPATIC FUNCTION PANEL
ALT: 7 U/L (ref 0–35)
AST: 14 U/L (ref 0–37)
Albumin: 4.5 g/dL (ref 3.5–5.2)
Alkaline Phosphatase: 77 U/L (ref 39–117)
Bilirubin, Direct: 0.1 mg/dL (ref 0.0–0.3)
Total Bilirubin: 0.5 mg/dL (ref 0.2–1.2)
Total Protein: 6.6 g/dL (ref 6.0–8.3)

## 2023-12-29 LAB — BASIC METABOLIC PANEL WITH GFR
BUN: 21 mg/dL (ref 6–23)
CO2: 25 meq/L (ref 19–32)
Calcium: 9.1 mg/dL (ref 8.4–10.5)
Chloride: 101 meq/L (ref 96–112)
Creatinine, Ser: 2.25 mg/dL — ABNORMAL HIGH (ref 0.40–1.20)
GFR: 22.05 mL/min — ABNORMAL LOW (ref >60.00)
Glucose, Bld: 85 mg/dL (ref 70–99)
Potassium: 4.1 meq/L (ref 3.5–5.1)
Sodium: 135 meq/L (ref 135–145)

## 2023-12-29 LAB — URINALYSIS, ROUTINE W REFLEX MICROSCOPIC
Bilirubin Urine: NEGATIVE
Hgb urine dipstick: NEGATIVE
Ketones, ur: NEGATIVE
Nitrite: NEGATIVE
RBC / HPF: NONE SEEN (ref 0–?)
Specific Gravity, Urine: 1.01 (ref 1.000–1.030)
Total Protein, Urine: NEGATIVE
Urine Glucose: NEGATIVE
Urobilinogen, UA: 0.2 (ref 0.0–1.0)
pH: 5.5 (ref 5.0–8.0)

## 2023-12-29 LAB — HEMOGLOBIN A1C: Hgb A1c MFr Bld: 5.4 % (ref 4.6–6.5)

## 2023-12-29 LAB — IBC PANEL
Iron: 71 ug/dL (ref 42–145)
Saturation Ratios: 17.7 % — ABNORMAL LOW (ref 20.0–50.0)
TIBC: 401.8 ug/dL (ref 250.0–450.0)
Transferrin: 287 mg/dL (ref 212.0–360.0)

## 2023-12-29 LAB — FERRITIN: Ferritin: 8.1 ng/mL — ABNORMAL LOW (ref 10.0–291.0)

## 2023-12-29 LAB — VITAMIN B12: Vitamin B-12: 132 pg/mL — ABNORMAL LOW (ref 211–911)

## 2023-12-29 LAB — TSH: TSH: 2.26 u[IU]/mL (ref 0.35–5.50)

## 2023-12-29 LAB — VITAMIN D 25 HYDROXY (VIT D DEFICIENCY, FRACTURES): VITD: 41.11 ng/mL (ref 30.00–100.00)

## 2023-12-29 MED ORDER — TRAMADOL HCL 50 MG PO TABS: 50.0000 mg | ORAL_TABLET | Freq: Four times a day (QID) | ORAL | 2 refills | Status: DC | PRN

## 2023-12-29 MED ORDER — KETOROLAC TROMETHAMINE 30 MG/ML IJ SOLN
30.0000 mg | Freq: Once | INTRAMUSCULAR | Status: AC
Start: 2023-12-29 — End: 2023-12-29
  Administered 2023-12-29: 30 mg via INTRAMUSCULAR

## 2023-12-29 NOTE — Patient Instructions (Addendum)
 You had the toradol 30 mg shot today for pain  Please take all new medication as prescribed - the tramadol as needed  Ok to start the Prolia shots for osteoporosis when this is available  Please continue all other medications as before, and refills have been done if requested.  Please have the pharmacy call with any other refills you may need.  Please continue your efforts at being more active, low cholesterol diet, and weight control.  You are otherwise up to date with prevention measures today.  Please keep your appointments with your specialists as you may have planned  Please call if you would want referral for Physical Therapy  You will be contacted regarding the referral for: MRI for the lower back  Please go to the XRAY Department in the first floor for the x-ray testing to check for fracture after falls  Please go to the LAB at the blood drawing area for the tests to be done  You will be contacted by phone if any changes need to be made immediately.  Otherwise, you will receive a letter about your results with an explanation, but please check with MyChart first.  Please make an Appointment to return in 4 months, or sooner if needed

## 2023-12-29 NOTE — Assessment & Plan Note (Signed)
 With acute on chronic low lumbar midline 3 wks after falls x 2; has seen ortho but pt deferred PT; now for toradol 30 mg IM, tramadol prn, ls spine plain films today, MRI LS spine, and consider f/u ortho for abnormal.

## 2023-12-29 NOTE — Assessment & Plan Note (Signed)
Last vitamin D Lab Results  Component Value Date   VD25OH 13.50 (L) 04/09/2023   Low, to start  oral replacement

## 2023-12-29 NOTE — Telephone Encounter (Signed)
 Pt seen in office today.

## 2023-12-29 NOTE — Assessment & Plan Note (Signed)
 Lab Results  Component Value Date   VITAMINB12 135 (L) 04/09/2023   Low, to start oral replacement - b12 1000 mcg qd

## 2023-12-29 NOTE — Assessment & Plan Note (Signed)
 Last dxa with worst T score -3.0 - now for prolia start

## 2023-12-29 NOTE — Addendum Note (Signed)
 Addended by: Lan Pin on: 12/29/2023 10:29 AM   Modules accepted: Orders

## 2023-12-29 NOTE — Assessment & Plan Note (Signed)
 Age and sex appropriate education and counseling updated with regular exercise and diet Referrals for preventative services - declines mammogram for now Immunizations addressed - none needed Smoking counseling  - pt counsled to quit, pt not ready Evidence for depression or other mood disorder - chronic anxiety stable Most recent labs reviewed. I have personally reviewed and have noted: 1) the patient's medical and social history 2) The patient's current medications and supplements 3) The patient's height, weight, and BMI have been recorded in the chart

## 2023-12-29 NOTE — Assessment & Plan Note (Signed)
Lab Results  Component Value Date   HGBA1C 5.3 04/09/2023   Stable, pt to continue current medical treatment  - diet, wt control

## 2023-12-29 NOTE — Assessment & Plan Note (Signed)
 Lab Results  Component Value Date   CHOL 286 (H) 04/09/2023   HDL 36.10 (L) 04/09/2023   LDLCALC Comment 03/12/2017   LDLDIRECT 178.0 04/09/2023   TRIG (H) 04/09/2023    549.0 Triglyceride is over 400; calculations on Lipids are invalid.   CHOLHDL 8 04/09/2023   Uncontrolled, declines statin for now

## 2023-12-29 NOTE — Assessment & Plan Note (Signed)
No recent overt bleeding, for f/u lab ?

## 2023-12-29 NOTE — Progress Notes (Signed)
 Patient ID: Lori [name], female   DOB: August 29, 1956, 68 y.o.   MRN: [id number]         Chief Complaint:: wellness exam and Medical Management of Chronic Issues (6 month follow up , discuss bone density results , has been going on for years and pain is worst )  , osteoporosis, low b12 and D, hld       HPI:  Lori [name] is a 68 y.o. female here for wellness exam; declines mammogram for now, o/w up to date                        Also with 3 wks worsening acute on chronic lbp in the midline with radiation to the buttock and hips bilaterally, and some urinary hesitation it seems. Has hx of osteoporosis and falls x 2 a few wks ago prior to worsening.  Has seen orthopedic who suggested PT but she is unable due to the pain level.  Tramadol from NP in jan ran out.  Due to start prolia soon for osteoporosis. Has hx of c spine surgury for DDD, none prior to the lower back and no recent imaging.  Using cane today, high risk for fall.  Pt denies chest pain, increased sob or doe, wheezing, orthopnea, PND, increased LE swelling, palpitations, dizziness or syncope.   Pt denies polydipsia, polyuria, or new focal neuro s/s.    Pt denies fever, wt loss, night sweats, loss of appetite, or other constitutional symptoms Denies urinary symptoms such as dysuria, frequency, urgency, flank pain, hematuria or n/v, fever, chills.   Wt Readings from Last 3 Encounters:  12/29/23 115 lb (52.2 kg)  11/12/23 112 lb (50.8 kg)  10/21/23 112 lb (50.8 kg)   BP Readings from Last 3 Encounters:  12/29/23 122/74  11/04/23 110/80  10/21/23 118/60   Immunization History  Administered Date(s) Administered   Fluad Quad(high Dose 65+) 05/29/2022   Fluad Trivalent(High Dose 65+) 06/09/2023   Influenza,inj,Quad PF,6+ Mos 12/03/2016, 05/18/2017, 05/20/2018, 05/25/2019   Influenza,inj,Quad PF,6-35 Mos 05/18/2019   Influenza-Unspecified 05/18/2017, 07/17/2018, 05/14/2021   Moderna Covid-19 Fall Seasonal Vaccine 47yrs & older  06/25/2022   Moderna Covid-19 Vaccine Bivalent Booster 36yrs & up 06/21/2023   PFIZER(Purple Top)SARS-COV-2 Vaccination 10/18/2019, 11/08/2019, 06/19/2020   Pneumococcal Conjugate-13 12/30/2014, 12/31/2016   Pneumococcal Polysaccharide-23 01/17/2014, 12/31/2017, 05/14/2021   Tdap 09/09/2013, 05/10/2016, 07/02/2022   Zoster Recombinant(Shingrix) 12/15/2016, 04/16/2017   Zoster, Live 08/31/2013   Health Maintenance Due  Topic Date Due   MAMMOGRAM  06/17/2018      Past Medical History:  Diagnosis Date   Allergic rhinitis    Anemia    hgb 13.9 on 11/22/21   Ankle fracture 05/2016   Anxiety    Arthritis    Asthma    Barrett esophagus 2007   Chest pain    Hx, no current problems as of 11/22/21.  Recurrent episodes.  Has had 3 negative nuclear stress test, and a completely normal coronary CT angiogram   COPD (chronic obstructive pulmonary disease) with chronic bronchitis (HCC)    & Emphysema   Depression    Duodenitis without mention of hemorrhage 2007   Esophageal reflux 2007   Esophageal stricture    Full dentures    patient does not wear dentures as of 11/22/21   H/O hiatal hernia    Hiatal hernia 2006,2007   HSV infection    Hyperlipidemia    rx crestor but pt not taking   Multiple  fractures    from falls, fx rt. elbow, fx left wrist, bilateral ankles   Pneumonia 10/2016   Restrictive lung disease 06/30/2017   Ulcer    no current problems as of 11/22/21   Past Surgical History:  Procedure Laterality Date   ABDOMINAL HYSTERECTOMY     BSO   APPENDECTOMY     BIOPSY  04/04/2022   Procedure: BIOPSY;  Surgeon: Brice Campi Albino Alu., MD;  Location: [facility] ENDOSCOPY;  Service: Gastroenterology;;   CESAREAN SECTION     x 2   CHOLECYSTECTOMY     CHONDROPLASTY Left 01/06/2015   Procedure: CHONDROPLASTY;  Surgeon: Adah Acron, MD;  Location: [facility];  Service: Orthopedics;  Laterality: Left;   COLONOSCOPY     CT CTA CORONARY W/CA SCORE W/CM &/OR WO/CM  12/02/2016    CORONARY CALCIUM SCORE 0.  NO EVIDENCE OF CAD.  Normal-sized PA.  Normal pulmonary venous drainage the left atrium.  No ASD or VSD.   ELBOW FRACTURE SURGERY     right   ESOPHAGOGASTRODUODENOSCOPY (EGD) WITH PROPOFOL N/A 04/04/2022   Procedure: ESOPHAGOGASTRODUODENOSCOPY (EGD) WITH PROPOFOL;  Surgeon: Brice Campi Albino Alu., MD;  Location: [facility] ENDOSCOPY;  Service: Gastroenterology;  Laterality: N/A;   EUS N/A 04/04/2022   Procedure: UPPER ENDOSCOPIC ULTRASOUND (EUS) RADIAL;  Surgeon: Normie Becton., MD;  Location: [facility] ENDOSCOPY;  Service: Gastroenterology;  Laterality: N/A;   EYE SURGERY Bilateral    cataracts removed   INSERTION OF MESH N/A 11/12/2022   Procedure: INSERTION OF MESH;  Surgeon: Shela Derby, MD;  Location: [facility] OR;  Service: General;  Laterality: N/A;   KNEE ARTHROSCOPY WITH EXCISION PLICA Left 01/06/2015   Procedure: KNEE ARTHROSCOPY WITH EXCISION PLICA;  Surgeon: Adah Acron, MD;  Location: [facility];  Service: Orthopedics;  Laterality: Left;   LIGAMENT REPAIR Right 11/28/2021   Procedure: RIGHT THUMB LIGAMENT RECONSTRUCTION AND TENDON INTERPOSITION;  Surgeon: Marilyn Shropshire, MD;  Location: [facility] OR;  Service: Orthopedics;  Laterality: Right;   Lower Extremity Arterial Dopplers  07/06/2013   RABI 1.0, LABI 1.1.; No evidence of significant vascular atherogenic plaque   NECK SURGERY     NM MYOVIEW LTD  09/2014   a) LOW RISK. Normal EF of 65%, no RWMA. NO ISCHEMIA OR INFARCTION.;; b) 05/02/2009: Normal study.  LOW RISK.  No ischemia or infarction.  EF 74%.  Breast attenuation noted.   TONSILLECTOMY     TRANSTHORACIC ECHOCARDIOGRAM  03/28/2021   a) Normal EF 60 to 65%.  GR 1 DD.  No R WMA.  Normal RV size and function.  Normal RAP.  Normal valves. => NORMAL;; b) 04/23/16/2010: Normal study.  LOW RISK.  No ischemia or infarction.  EF 74%.  Breast attenuation noted.17/2010: ([facility]) EF>55%.  Normal wall motion.  Normal valves.   WRIST FRACTURE SURGERY      left, has plate   XI ROBOTIC ASSISTED PARAESOPHAGEAL HERNIA REPAIR N/A 11/12/2022   Procedure: ROBOTIC DIAPHRAGMATIC HERNIA REPAIR WITH MESH;  Surgeon: Shela Derby, MD;  Location: [facility] OR;  Service: General;  Laterality: N/A;    reports that she has been smoking cigarettes. She started smoking about 46 years ago. She has a 23.2 pack-year smoking history. She has never used smokeless tobacco. She reports that she does not drink alcohol and does not use drugs. family history includes Dementia in her mother; Diabetes in her maternal aunt, maternal grandmother, and another family member; Emphysema in her father; Heart disease in her brother. Allergies  Allergen Reactions  Advil [Ibuprofen] Nausea And Vomiting   Bee Venom Swelling   Codeine Nausea Only and Other (See Comments)    CAUSES ULCERS   Clonidine Derivatives Other (See Comments)    Unknown    Doxycycline Other (See Comments)    nausea   Statins Other (See Comments)    unknown   Latex Rash   Levaquin [Levofloxacin] Nausea Only   Propoxyphene N-Acetaminophen Nausea Only   Current Outpatient Medications on File Prior to Visit  Medication Sig Dispense Refill   albuterol (PROAIR HFA) 108 (90 Base) MCG/ACT inhaler Inhale 2 puffs into the lungs every 6 (six) hours as needed for wheezing or shortness of breath. 1 each 2   ALPRAZolam (XANAX) 1 MG tablet TAKE 1 TABLET BY MOUTH AT BEDTIME AS NEEDED FOR ANXIETY 90 tablet 1   Budeson-Glycopyrrol-Formoterol (BREZTRI AEROSPHERE) 160-9-4.8 MCG/ACT AERO Inhale 2 puffs once a day with spacer to help prevent cough and wheeze.  Rinse mouth out afterwards.  During increased symptoms/sickness increase Breztri to 2 puffs twice a day with spacer until symptoms return to baseline 10.7 g 2   cetirizine (ZYRTEC ALLERGY) 10 MG tablet take1 tablet by mouth once a day as needed for runny nose/drainage down throat 30 tablet 3   dexlansoprazole (DEXILANT) 60 MG capsule Take 1 capsule (60 mg total) by mouth daily.  90 capsule 3   EPINEPHrine 0.3 mg/0.3 mL IJ SOAJ injection Inject 0.3 mg into the muscle as needed for anaphylaxis. Do not refrigerate. 2 each 1   Erenumab-aooe 140 MG/ML SOAJ Inject 140 mg into the skin every 30 (thirty) days.     FASENRA 30 MG/ML prefilled syringe INJECT 30MG  SUBCUTANEOUSLY EVERY 8 WEEKS 1 mL 6   FLUoxetine (PROZAC) 20 MG capsule Take 1 capsule (20 mg total) by mouth daily. 90 capsule 3   gabapentin (NEURONTIN) 600 MG tablet Take 600 mg by mouth 2 (two) times daily.     levalbuterol (XOPENEX HFA) 45 MCG/ACT inhaler Inhale 2 puffs every 4-6 hours as needed for cough, wheeze, tightness in chest, or shortness of breath 1 each 1   meclizine (ANTIVERT) 12.5 MG tablet TAKE 1 TABLET BY MOUTH THREE TIMES DAILY AS NEEDED FOR DIZZINESS 60 tablet 0   montelukast (SINGULAIR) 10 MG tablet Take 1 tablet (10 mg total) by mouth at bedtime. 90 tablet 1   Naloxone HCl 4 MG/0.25ML LIQD Place 1 spray into the nose as needed.     nitroGLYCERIN (NITROSTAT) 0.4 MG SL tablet Place 1 tablet (0.4 mg total) under the tongue every 5 (five) minutes as needed for chest pain. 25 tablet 3   nystatin cream (MYCOSTATIN) Apply 1 Application topically 2 (two) times daily. 60 g 0   promethazine (PHENERGAN) 12.5 MG tablet Take 1 tablet (12.5 mg total) by mouth every 8 (eight) hours as needed for nausea or vomiting. 60 tablet 1   tiZANidine (ZANAFLEX) 2 MG tablet Take 1 tablet (2 mg total) by mouth every 8 (eight) hours as needed for muscle spasms. 40 tablet 1   valACYclovir (VALTREX) 500 MG tablet Take 1 tablet (500 mg total) by mouth 2 (two) times daily. 14 tablet 2   zolpidem (AMBIEN) 5 MG tablet Take 1 tablet (5 mg total) by mouth at bedtime as needed for sleep. 90 tablet 1   Current Facility-Administered Medications on File Prior to Visit  Medication Dose Route Frequency Provider Last Rate Last Admin   Benralizumab SOSY 30 mg  30 mg Subcutaneous Q8 weeks Rochester Chuck, MD  30 mg at 12/22/23 0908    [START ON 01/15/2024] denosumab (PROLIA) injection 60 mg  60 mg Subcutaneous Once Roslyn Coombe, MD            ROS:  All others reviewed and negative.  Objective        PE:  BP 122/74 (BP Location: Left Arm, Patient Position: Sitting, Cuff Size: Normal)   Pulse 65   Temp 97.7 F (36.5 C) (Oral)   Ht 5\' 4"  (1.626 m)   Wt 115 lb (52.2 kg)   SpO2 95%   BMI 19.74 kg/m                 Constitutional: Pt appears in pain, unable to stand upright as per usual, thin low normal wt               HENT: Head: NCAT.                Right Ear: External ear normal.                 Left Ear: External ear normal.                Eyes: . Pupils are equal, round, and reactive to light. Conjunctivae and EOM are normal               Nose: without d/c or deformity               Neck: Neck supple. Gross normal ROM               Cardiovascular: Normal rate and regular rhythm.                 Pulmonary/Chest: Effort normal and breath sounds without rales or wheezing.                Abd:  Soft, NT, ND, + BS, no organomegaly               Spine: marked tender in midline and cries in agony, no swelling or rash, reduced lumbar excursion, also tender over bilateral upper buttocks SI joint areas               Neurological: Pt is alert. At baseline orientation, motor grossly intact               Skin: Skin is warm. No rashes, no other new lesions, LE edema - none               Psychiatric: Pt behavior is normal without agitation   Micro: none  Cardiac tracings I have personally interpreted today:  none  Pertinent Radiological findings (summarize): none   Lab Results  Component Value Date   WBC 9.5 09/30/2023   HGB 12.7 09/30/2023   HCT 37.4 09/30/2023   PLT 298 09/30/2023   GLUCOSE 95 09/30/2023   CHOL 286 (H) 04/09/2023   TRIG (H) 04/09/2023    549.0 Triglyceride is over 400; calculations on Lipids are invalid.   HDL 36.10 (L) 04/09/2023   LDLDIRECT 178.0 04/09/2023   LDLCALC Comment 03/12/2017   ALT  24 06/30/2023   AST 16 06/30/2023   NA 136 09/30/2023   K 3.9 09/30/2023   CL 106 09/30/2023   CREATININE 0.95 09/30/2023   BUN 18 09/30/2023   CO2 20 (L) 09/30/2023   TSH 1.42 01/10/2022   INR 0.9 08/31/2008   HGBA1C 5.3 04/09/2023   Assessment/Plan:  Lori [name] is  a 68 y.o. White or Caucasian [1] female with  has a past medical history of Allergic rhinitis, Anemia, Ankle fracture (05/2016), Anxiety, Arthritis, Asthma, Barrett esophagus (2007), Chest pain, COPD (chronic obstructive pulmonary disease) with chronic bronchitis (HCC), Depression, Duodenitis without mention of hemorrhage (2007), Esophageal reflux (2007), Esophageal stricture, Full dentures, H/O hiatal hernia, Hiatal hernia (0454,0981), HSV infection, Hyperlipidemia, Multiple fractures, Pneumonia (10/2016), Restrictive lung disease (06/30/2017), and Ulcer.  Encounter for well adult exam with abnormal findings Age and sex appropriate education and counseling updated with regular exercise and diet Referrals for preventative services - declines mammogram for now Immunizations addressed - none needed Smoking counseling  - pt counsled to quit, pt not ready Evidence for depression or other mood disorder - chronic anxiety stable Most recent labs reviewed. I have personally reviewed and have noted: 1) the patient's medical and social history 2) The patient's current medications and supplements 3) The patient's height, weight, and BMI have been recorded in the chart   Hyperlipidemia LDL goal <100 Lab Results  Component Value Date   CHOL 286 (H) 04/09/2023   HDL 36.10 (L) 04/09/2023   LDLCALC Comment 03/12/2017   LDLDIRECT 178.0 04/09/2023   TRIG (H) 04/09/2023    549.0 Triglyceride is over 400; calculations on Lipids are invalid.   CHOLHDL 8 04/09/2023   Uncontrolled, declines statin for now   Anemia, iron deficiency No recent overt bleeding, for f/u lab  B12 deficiency Lab Results  Component Value Date    VITAMINB12 135 (L) 04/09/2023   Low, to start oral replacement - b12 1000 mcg qd   Hyperglycemia Lab Results  Component Value Date   HGBA1C 5.3 04/09/2023   Stable, pt to continue current medical treatment  - diet, wt control   Vitamin D deficiency Last vitamin D Lab Results  Component Value Date   VD25OH 13.50 (L) 04/09/2023   Low, to start  oral replacement   Lower back pain With acute on chronic low lumbar midline 3 wks after falls x 2; has seen ortho but pt deferred PT; now for toradol 30 mg IM, tramadol prn, ls spine plain films today, MRI LS spine, and consider f/u ortho for abnormal.  Osteoporosis Last dxa with worst T score -3.0 - now for prolia start  Followup: Return in about 4 months (around 04/29/2024).  Rosalia Colonel, MD 12/29/2023 9:48 AM [facility]  Primary Care - [facility] Internal Medicine

## 2023-12-31 ENCOUNTER — Telehealth: Payer: Self-pay | Admitting: Internal Medicine

## 2023-12-31 ENCOUNTER — Emergency Department (HOSPITAL_COMMUNITY)
Admission: EM | Admit: 2023-12-31 | Discharge: 2024-01-01 | Disposition: A | Discharge status: Home / Self Care | Attending: Emergency Medicine | Admitting: Emergency Medicine

## 2023-12-31 ENCOUNTER — Encounter (HOSPITAL_COMMUNITY): Payer: Self-pay | Admitting: Emergency Medicine

## 2023-12-31 ENCOUNTER — Other Ambulatory Visit: Payer: Self-pay

## 2023-12-31 DIAGNOSIS — R799 Abnormal finding of blood chemistry, unspecified: Secondary | ICD-10-CM | POA: Diagnosis not present

## 2023-12-31 DIAGNOSIS — Z7951 Long term (current) use of inhaled steroids: Secondary | ICD-10-CM | POA: Insufficient documentation

## 2023-12-31 DIAGNOSIS — Z91040 Latex allergy status: Secondary | ICD-10-CM | POA: Diagnosis not present

## 2023-12-31 DIAGNOSIS — G8929 Other chronic pain: Secondary | ICD-10-CM | POA: Diagnosis not present

## 2023-12-31 DIAGNOSIS — J449 Chronic obstructive pulmonary disease, unspecified: Secondary | ICD-10-CM | POA: Diagnosis not present

## 2023-12-31 DIAGNOSIS — R899 Unspecified abnormal finding in specimens from other organs, systems and tissues: Secondary | ICD-10-CM | POA: Diagnosis not present

## 2023-12-31 LAB — BASIC METABOLIC PANEL WITH GFR
Anion gap: 6 (ref 5–15)
BUN: 15 mg/dL (ref 8–23)
CO2: 28 mmol/L (ref 22–32)
Calcium: 9.5 mg/dL (ref 8.9–10.3)
Chloride: 101 mmol/L (ref 98–111)
Creatinine, Ser: 1.28 mg/dL — ABNORMAL HIGH (ref 0.44–1.00)
GFR, Estimated: 46 mL/min — ABNORMAL LOW (ref >60)
Glucose, Bld: 83 mg/dL (ref 70–99)
Potassium: 4.4 mmol/L (ref 3.5–5.1)
Sodium: 135 mmol/L (ref 135–145)

## 2023-12-31 LAB — CBC WITH DIFFERENTIAL/PLATELET
Abs Immature Granulocytes: 0.03 10*3/uL (ref 0.00–0.07)
Basophils Absolute: 0 10*3/uL (ref 0.0–0.1)
Basophils Relative: 0 %
Eosinophils Absolute: 0 10*3/uL (ref 0.0–0.5)
Eosinophils Relative: 0 %
HCT: 37.4 % (ref 36.0–46.0)
Hemoglobin: 12.6 g/dL (ref 12.0–15.0)
Immature Granulocytes: 0 %
Lymphocytes Relative: 36 %
Lymphs Abs: 2.8 10*3/uL (ref 0.7–4.0)
MCH: 31 pg (ref 26.0–34.0)
MCHC: 33.7 g/dL (ref 30.0–36.0)
MCV: 92.1 fL (ref 80.0–100.0)
Monocytes Absolute: 0.5 10*3/uL (ref 0.1–1.0)
Monocytes Relative: 6 %
Neutro Abs: 4.5 10*3/uL (ref 1.7–7.7)
Neutrophils Relative %: 58 %
Platelets: 280 10*3/uL (ref 150–400)
RBC: 4.06 MIL/uL (ref 3.87–5.11)
RDW: 12.8 % (ref 11.5–15.5)
WBC: 7.8 10*3/uL (ref 4.0–10.5)
nRBC: 0 % (ref 0.0–0.2)

## 2023-12-31 LAB — URINALYSIS, ROUTINE W REFLEX MICROSCOPIC
Bilirubin Urine: NEGATIVE
Glucose, UA: NEGATIVE mg/dL
Hgb urine dipstick: NEGATIVE
Ketones, ur: NEGATIVE mg/dL
Leukocytes,Ua: NEGATIVE
Nitrite: NEGATIVE
Protein, ur: NEGATIVE mg/dL
Specific Gravity, Urine: 1.008 (ref 1.005–1.030)
pH: 5 (ref 5.0–8.0)

## 2023-12-31 MED ORDER — SODIUM CHLORIDE 0.9 % IV BOLUS: 1000.0000 mL | Freq: Once | INTRAVENOUS | Status: DC

## 2023-12-31 NOTE — Telephone Encounter (Signed)
 Copied from CRM [id number]. Topic: General - Other >> Dec 31, 2023  9:19 AM Annelle Kiel wrote: Reason for CRM: patient is needing a call back regarding her labs and xray results she would like a call back and also stated she would like a copy of those results as well

## 2023-12-31 NOTE — Telephone Encounter (Signed)
 Copied from CRM [id number]. Topic: Clinical - Lab/Test Results >> Dec 31, 2023  1:35 PM Opal Bill wrote: Reason for CRM: Pt called back about her labs and xrays. Lab results have been relayed and she has no further questions. Advised that the X-rays are still pending review and she will be contacted about it.

## 2023-12-31 NOTE — ED Triage Notes (Signed)
 Pt BIB by [facility] for abnormal lab work. Per EMS, pt had labs drawn 2 days ago and was told her kidney function is decreased.

## 2024-01-01 NOTE — Discharge Instructions (Signed)
 Your lab work tonight was reassuring and was greatly improved from lab work from 3 days ago.  Please be sure to drink plenty of water at home and to follow-up with your primary care provider for repeat labs in approximately 1 week to ensure that your kidney function remains stable.  If you develop any life-threatening symptoms please return to the emergency department.

## 2024-01-01 NOTE — ED Provider Notes (Signed)
 [place] [facility]MENT AT [facility] Provider Note   CSN: [id number] Arrival date & time: 12/31/23  1434     History  Chief Complaint  Patient presents with   Abnormal Lab    Lori [name] is a 67 y.o. female.  Patient with past medical history significant for COPD, chronic low back pain presents to the emergency department at the recommendation of primary care due to lab work that showed decreased kidney function.  Patient has no active complaints at this time other than the abnormal labs.  She denies nausea, vomiting, diarrhea.   Abnormal Lab      Home Medications Prior to Admission medications   Medication Sig Start Date End Date Taking? Authorizing Provider  albuterol  (PROAIR  HFA) 108 (90 Base) MCG/ACT inhaler Inhale 2 puffs into the lungs every 6 (six) hours as needed for wheezing or shortness of breath. 12/27/22   Roslyn Coombe, MD  ALPRAZolam  (XANAX ) 1 MG tablet TAKE 1 TABLET BY MOUTH AT BEDTIME AS NEEDED FOR ANXIETY 11/19/23   Roslyn Coombe, MD  Budeson-Glycopyrrol-Formoterol  (BREZTRI  AEROSPHERE) 160-9-4.8 MCG/ACT AERO Inhale 2 puffs once a day with spacer to help prevent cough and wheeze.  Rinse mouth out afterwards.  During increased symptoms/sickness increase Breztri  to 2 puffs twice a day with spacer until symptoms return to baseline 11/04/23   Tinnie Forehand, FNP  cetirizine  (ZYRTEC  ALLERGY) 10 MG tablet take1 tablet by mouth once a day as needed for runny nose/drainage down throat 11/04/23   Tinnie Forehand, FNP  dexlansoprazole  (DEXILANT ) 60 MG capsule Take 1 capsule (60 mg total) by mouth daily. 06/06/23   Roslyn Coombe, MD  EPINEPHrine  0.3 mg/0.3 mL IJ SOAJ injection Inject 0.3 mg into the muscle as needed for anaphylaxis. Do not refrigerate. 08/04/23   Rochester Chuck, MD  Erenumab-aooe 140 MG/ML SOAJ Inject 140 mg into the skin every 30 (thirty) days.    [provider]  FASENRA  30 MG/ML prefilled syringe INJECT 30MG  SUBCUTANEOUSLY  EVERY 8 WEEKS 09/21/23   Rochester Chuck, MD  FLUoxetine  (PROZAC ) 20 MG capsule Take 1 capsule (20 mg total) by mouth daily. 06/30/23 06/29/24  Roslyn Coombe, MD  gabapentin  (NEURONTIN ) 600 MG tablet Take 600 mg by mouth 2 (two) times daily. 04/30/22   [provider]  levalbuterol  (XOPENEX  HFA) 45 MCG/ACT inhaler Inhale 2 puffs every 4-6 hours as needed for cough, wheeze, tightness in chest, or shortness of breath 11/04/23   Tinnie Forehand, FNP  meclizine  (ANTIVERT ) 12.5 MG tablet TAKE 1 TABLET BY MOUTH THREE TIMES DAILY AS NEEDED FOR DIZZINESS 12/08/23   Roslyn Coombe, MD  montelukast  (SINGULAIR ) 10 MG tablet Take 1 tablet (10 mg total) by mouth at bedtime. 11/04/23   Tinnie Forehand, FNP  Naloxone HCl 4 MG/0.25ML LIQD Place 1 spray into the nose as needed.    [provider]  nitroGLYCERIN  (NITROSTAT ) 0.4 MG SL tablet Place 1 tablet (0.4 mg total) under the tongue every 5 (five) minutes as needed for chest pain. 06/06/23   Roslyn Coombe, MD  nystatin  cream (MYCOSTATIN ) Apply 1 Application topically 2 (two) times daily. 09/19/23   Wellington Half, FNP  promethazine  (PHENERGAN ) 12.5 MG tablet Take 1 tablet (12.5 mg total) by mouth every 8 (eight) hours as needed for nausea or vomiting. 06/30/23   Roslyn Coombe, MD  tiZANidine  (ZANAFLEX ) 2 MG tablet Take 1 tablet (2 mg total) by mouth every 8 (eight) hours as needed for muscle spasms.  12/02/23   Roslyn Coombe, MD  traMADol  (ULTRAM ) 50 MG tablet Take 1 tablet (50 mg total) by mouth every 6 (six) hours as needed. 12/29/23   Roslyn Coombe, MD  valACYclovir  (VALTREX ) 500 MG tablet Take 1 tablet (500 mg total) by mouth 2 (two) times daily. 09/19/23   Wellington Half, FNP  zolpidem  (AMBIEN ) 5 MG tablet Take 1 tablet (5 mg total) by mouth at bedtime as needed for sleep. 12/10/23   Roslyn Coombe, MD      Allergies    Advil  [ibuprofen ], Bee venom, Codeine , Clonidine derivatives, Doxycycline , Statins, Latex, Levaquin  [levofloxacin ], and  Propoxyphene n-acetaminophen     Review of Systems   Review of Systems  Physical Exam Updated Vital Signs BP 108/68 (BP Location: Left Arm)   Pulse 64   Temp 98.3 F (36.8 C)   Resp 18   SpO2 98%  Physical Exam Vitals and nursing note reviewed.  HENT:     Head: Normocephalic and atraumatic.  Eyes:     Conjunctiva/sclera: Conjunctivae normal.  Pulmonary:     Effort: Pulmonary effort is normal. No respiratory distress.  Musculoskeletal:        General: No signs of injury.     Cervical back: Normal range of motion.  Skin:    General: Skin is dry.  Neurological:     Mental Status: She is alert.  Psychiatric:        Speech: Speech normal.        Behavior: Behavior normal.     ED Results / Procedures / Treatments   Labs (all labs ordered are listed, but only abnormal results are displayed) Labs Reviewed  BASIC METABOLIC PANEL WITH GFR - Abnormal; Notable for the following components:      Result Value   Creatinine, Ser 1.28 (*)    GFR, Estimated 46 (*)    All other components within normal limits  CBC WITH DIFFERENTIAL/PLATELET  URINALYSIS, ROUTINE W REFLEX MICROSCOPIC    EKG None  Radiology No results found.  Procedures Procedures    Medications Ordered in ED Medications  sodium chloride  0.9 % bolus 1,000 mL (has no administration in time range)    ED Course/ Medical Decision Making/ A&P                                 Medical Decision Making  This patient presents to the ED for concern of possible acute kidney injury, this involves an extensive number of treatment options, and is a complaint that carries with it a high risk of complications and morbidity.     Co morbidities that complicate the patient evaluation  COPD   Additional history obtained:  Additional history obtained from EMS External records from outside source obtained and reviewed including primary care labs   Lab Tests:  I Ordered, and personally interpreted labs.  The  pertinent results include: Creatinine significantly improved with a value of 1.28 today (2.25 3 days ago)    Social Determinants of Health:  Patient is a daily tobacco smoker   Test / Admission - Considered:  Patient with lab work significantly improved showing no signs of AKI at this time.  Patient asymptomatic at this time.  Advised patient to hydrate at home and to have her metabolic panel rechecked in approximate 1 week to ensure that kidney function remains stable and improved from previous lab results.  Patient voices understanding with plan.  Discharge home at this time.  No indication for admission or further emergent workup.         Final Clinical Impression(s) / ED Diagnoses Final diagnoses:  Abnormal laboratory test    Rx / DC Orders ED Discharge Orders     None         Delories Fetter 01/01/24 [id number]    Ballard Bongo, MD 01/02/24 [phone]

## 2024-01-02 ENCOUNTER — Telehealth: Payer: Self-pay

## 2024-01-02 NOTE — Transitions of Care (Post Inpatient/ED Visit) (Signed)
 01/02/2024  Name: Lori [name] MRN: [id number] DOB: 10/29/1955  Today's TOC FU Call Status: Today's TOC FU Call Status:: Successful TOC FU Call Completed TOC FU Call Complete Date: 01/02/24 Patient's Name and Date of Birth confirmed.  Transition Care Management Follow-up Telephone Call Date of Discharge: 01/01/24 Discharge Facility: [facility]) Type of Discharge: Emergency Department Reason for ED Visit: Other: (abnormal labs) How have you been since you were released from the hospital?: Better Any questions or concerns?: No  Items Reviewed: Did you receive and understand the discharge instructions provided?: Yes Medications obtained,verified, and reconciled?: Yes (Medications Reviewed) Any new allergies since your discharge?: No Dietary orders reviewed?: Yes Do you have support at home?: No  Medications Reviewed Today: Medications Reviewed Today     Reviewed by Darrall Ellison, LPN (Licensed Practical Nurse) on 01/02/24 at 1054  Med List Status: <None>   Medication Order Taking? Sig Documenting Provider Last Dose Status Informant  albuterol  (PROAIR  HFA) 108 (90 Base) MCG/ACT inhaler [id number] No Inhale 2 puffs into the lungs every 6 (six) hours as needed for wheezing or shortness of breath. Roslyn Coombe, MD Taking Active Self, Pharmacy Records  ALPRAZolam  (XANAX ) 1 MG tablet [id number] No TAKE 1 TABLET BY MOUTH AT BEDTIME AS NEEDED FOR ANXIETY Roslyn Coombe, MD Taking Active   Benralizumab  SOSY 30 mg [id number]   Rochester Chuck, MD  Active   Budeson-Glycopyrrol-Formoterol  (BREZTRI  AEROSPHERE) 160-9-4.8 MCG/ACT AERO [id number] No Inhale 2 puffs once a day with spacer to help prevent cough and wheeze.  Rinse mouth out afterwards.  During increased symptoms/sickness increase Breztri  to 2 puffs twice a day with spacer until symptoms return to baseline Tinnie Forehand, FNP Taking Active   cetirizine  (ZYRTEC  ALLERGY) 10 MG tablet [id number] No take1 tablet by mouth once a  day as needed for runny nose/drainage down throat Tinnie Forehand, FNP Taking Active   denosumab  (PROLIA ) injection 60 mg [id number]   Roslyn Coombe, MD  Active   dexlansoprazole  (DEXILANT ) 60 MG capsule [id number] No Take 1 capsule (60 mg total) by mouth daily. Roslyn Coombe, MD Taking Active   EPINEPHrine  0.3 mg/0.3 mL IJ SOAJ injection [id number] No Inject 0.3 mg into the muscle as needed for anaphylaxis. Do not refrigerate. Rochester Chuck, MD Taking Active   Erenumab-aooe 140 MG/ML [place] [id number] No Inject 140 mg into the skin every 30 (thirty) days. [provider] Taking Active Self, Pharmacy Records           Med Note Basil Lim, DUROJAHYE' R   Wed May 14, 2023  4:24 AM) LF 04/29/23, SOLD TO PT 04/30/23  FASENRA  30 MG/ML prefilled syringe [id number] No INJECT 30MG  SUBCUTANEOUSLY EVERY 8 WEEKS Rochester Chuck, MD Taking Active   FLUoxetine  (PROZAC ) 20 MG capsule [id number] No Take 1 capsule (20 mg total) by mouth daily. Roslyn Coombe, MD Taking Active   gabapentin  (NEURONTIN ) 600 MG tablet [id number] No Take 600 mg by mouth 2 (two) times daily. [provider] Taking Active Self, Pharmacy Records  levalbuterol  (XOPENEX  HFA) 45 MCG/ACT inhaler [id number] No Inhale 2 puffs every 4-6 hours as needed for cough, wheeze, tightness in chest, or shortness of breath Tinnie Forehand, FNP Taking Active   meclizine  (ANTIVERT ) 12.5 MG tablet [id number] No TAKE 1 TABLET BY MOUTH THREE TIMES DAILY AS NEEDED FOR DIZZINESS John, James W, MD Taking Active   montelukast  (SINGULAIR ) 10 MG tablet [id number] No Take 1 tablet (10 mg total) by mouth at bedtime. Tinnie Forehand,  FNP Taking Active   Naloxone HCl 4 MG/0.25ML LIQD [id number] No Place 1 spray into the nose as needed. [provider] Taking Active Self  nitroGLYCERIN  (NITROSTAT ) 0.4 MG SL tablet [id number] No Place 1 tablet (0.4 mg total) under the tongue every 5 (five) minutes as needed for chest pain. Roslyn Coombe, MD Taking  Active   nystatin  cream (MYCOSTATIN ) [id number] No Apply 1 Application topically 2 (two) times daily. Wellington Half, FNP Taking Active   promethazine  (PHENERGAN ) 12.5 MG tablet [id number] No Take 1 tablet (12.5 mg total) by mouth every 8 (eight) hours as needed for nausea or vomiting. Roslyn Coombe, MD Taking Active   tiZANidine  (ZANAFLEX ) 2 MG tablet [id number] No Take 1 tablet (2 mg total) by mouth every 8 (eight) hours as needed for muscle spasms. Roslyn Coombe, MD Taking Active   traMADol  (ULTRAM ) 50 MG tablet [id number]  Take 1 tablet (50 mg total) by mouth every 6 (six) hours as needed. Roslyn Coombe, MD  Active   valACYclovir  (VALTREX ) 500 MG tablet [id number] No Take 1 tablet (500 mg total) by mouth 2 (two) times daily. Wellington Half, FNP Taking Active   zolpidem  (AMBIEN ) 5 MG tablet [id number] No Take 1 tablet (5 mg total) by mouth at bedtime as needed for sleep. Roslyn Coombe, MD Taking Active             Home Care and Equipment/Supplies: Were Home Health Services Ordered?: NA Any new equipment or medical supplies ordered?: NA  Functional Questionnaire: Do you need assistance with bathing/showering or dressing?: No Do you need assistance with meal preparation?: No Do you need assistance with eating?: No Do you have difficulty maintaining continence: No Do you need assistance with getting out of bed/getting out of a chair/moving?: No Do you have difficulty managing or taking your medications?: No  Follow up appointments reviewed: PCP Follow-up appointment confirmed?: Yes Date of PCP follow-up appointment?: 01/07/24 Follow-up Provider: [facility] Follow-up appointment confirmed?: NA Do you need transportation to your follow-up appointment?: No Do you understand care options if your condition(s) worsen?: Yes-patient verbalized understanding    SIGNATURE Darrall Ellison, LPN [facility] Nurse Health Advisor Direct Dial [phone]

## 2024-01-04 ENCOUNTER — Telehealth: Payer: Self-pay | Admitting: Internal Medicine

## 2024-01-04 NOTE — Telephone Encounter (Signed)
 Copied from CRM [id number]. Topic: Clinical - Lab/Test Results >> Jan 02, 2024 11:00 AM Lori [name] wrote: Reason for CRM: pt called to follow up on her xray results. Please call pt back at [phone]

## 2024-01-04 NOTE — Telephone Encounter (Signed)
 Very sorry, we simply dont have the results yet from the lower back xrays from apr 14, but will let her know as soon as we have them.   thanks

## 2024-01-05 ENCOUNTER — Other Ambulatory Visit (HOSPITAL_COMMUNITY): Payer: Self-pay

## 2024-01-05 ENCOUNTER — Telehealth: Payer: Self-pay

## 2024-01-05 ENCOUNTER — Telehealth: Payer: Self-pay | Admitting: Internal Medicine

## 2024-01-05 NOTE — Telephone Encounter (Signed)
 Prolia VOB initiated via [email or web address]  Next Prolia inj DUE: NEW START

## 2024-01-05 NOTE — Telephone Encounter (Signed)
 Will call Pt once the results are back.

## 2024-01-05 NOTE — Telephone Encounter (Signed)
 Copied from CRM [id number]. Topic: Clinical - Lab/Test Results >> Jan 05, 2024 10:14 AM Albertha Alosa wrote: Reason for CRM: Patient called in regarding xray results, would like for a nurse to give her a callback, she would also like a copy of her xrays sent to her

## 2024-01-07 ENCOUNTER — Ambulatory Visit (INDEPENDENT_AMBULATORY_CARE_PROVIDER_SITE_OTHER): Payer: Self-pay

## 2024-01-07 ENCOUNTER — Encounter: Payer: Self-pay | Admitting: Internal Medicine

## 2024-01-07 ENCOUNTER — Ambulatory Visit (INDEPENDENT_AMBULATORY_CARE_PROVIDER_SITE_OTHER): Admitting: Internal Medicine

## 2024-01-07 VITALS — BP 120/84 | HR 61 | Temp 98.1°F | Ht 64.0 in | Wt 114.5 lb

## 2024-01-07 DIAGNOSIS — E559 Vitamin D deficiency, unspecified: Secondary | ICD-10-CM | POA: Diagnosis not present

## 2024-01-07 DIAGNOSIS — N179 Acute kidney failure, unspecified: Secondary | ICD-10-CM | POA: Diagnosis not present

## 2024-01-07 DIAGNOSIS — M5440 Lumbago with sciatica, unspecified side: Secondary | ICD-10-CM | POA: Diagnosis not present

## 2024-01-07 DIAGNOSIS — E538 Deficiency of other specified B group vitamins: Secondary | ICD-10-CM | POA: Diagnosis not present

## 2024-01-07 DIAGNOSIS — M810 Age-related osteoporosis without current pathological fracture: Secondary | ICD-10-CM | POA: Diagnosis not present

## 2024-01-07 DIAGNOSIS — J309 Allergic rhinitis, unspecified: Secondary | ICD-10-CM | POA: Diagnosis not present

## 2024-01-07 NOTE — Assessment & Plan Note (Signed)
 Lab Results  Component Value Date   VITAMINB12 132 (L) 12/29/2023   Low, reminded to start oral replacement - b12 1000 mcg qd

## 2024-01-07 NOTE — Patient Instructions (Addendum)
 Please continue all other medications as before, and stay hydrated.  You should be notified to start the Prolia  shots for osteoporosis.    Please have the pharmacy call with any other refills you may need.  Please continue your efforts at being more active, low cholesterol diet, and weight control.  Please keep your appointments with your specialists as you may have planned  You will be contacted by phone if any changes need to be made immediately when we know the xray results.  Otherwise, you will receive a letter about your results with an explanation, but please check with MyChart first.

## 2024-01-07 NOTE — Progress Notes (Signed)
 Patient ID: Lori [name], female   DOB: May 28, 1956, 68 y.o.   MRN: [id number]        Chief Complaint: follow up recent fall with AKI, LBP, osteoporosis, left post mid arm bruising       HPI:  Lori [name] is a 68 y.o. female here after seen here initially apr 14 after fall with LBP, found to have AKI likely low volume related, and later in ED apr 16 with much improved cr to 1.28 , apparently already able to be rehydrated oral fluids.  No IVF needed.   Bone Density with worst t -score -3.0, c/w osteoporosis, now for prolia  shots when can be started.  Also has mid left post arm bruising and soreness without swelling improved now yellowish green bruising.  Unfortunately, LS spine films still pending from apr 14 to r/o fracture.  Pt continues to have recurring LBP with some lessening, and no bowel or bladder change, fever, wt loss,  worsening LE pain/numbness/weakness, gait change or further falls.  Back brace certainly seems to help.        Wt Readings from Last 3 Encounters:  01/07/24 114 lb 8 oz (51.9 kg)  12/29/23 115 lb (52.2 kg)  11/12/23 112 lb (50.8 kg)   BP Readings from Last 3 Encounters:  01/07/24 120/84  01/01/24 108/68  12/29/23 122/74         Past Medical History:  Diagnosis Date   Allergic rhinitis    Anemia    hgb 13.9 on 11/22/21   Ankle fracture 05/2016   Anxiety    Arthritis    Asthma    Barrett esophagus 2007   Chest pain    Hx, no current problems as of 11/22/21.  Recurrent episodes.  Has had 3 negative nuclear stress test, and a completely normal coronary CT angiogram   COPD (chronic obstructive pulmonary disease) with chronic bronchitis (HCC)    & Emphysema   Depression    Duodenitis without mention of hemorrhage 2007   Esophageal reflux 2007   Esophageal stricture    Full dentures    patient does not wear dentures as of 11/22/21   H/O hiatal hernia    Hiatal hernia 2006,2007   HSV infection    Hyperlipidemia    rx crestor  but pt not taking   Multiple  fractures    from falls, fx rt. elbow, fx left wrist, bilateral ankles   Pneumonia 10/2016   Restrictive lung disease 06/30/2017   Ulcer    no current problems as of 11/22/21   Past Surgical History:  Procedure Laterality Date   ABDOMINAL HYSTERECTOMY     BSO   APPENDECTOMY     BIOPSY  04/04/2022   Procedure: BIOPSY;  Surgeon: Normie Becton., MD;  Location: [facility] ENDOSCOPY;  Service: Gastroenterology;;   CESAREAN SECTION     x 2   CHOLECYSTECTOMY     CHONDROPLASTY Left 01/06/2015   Procedure: CHONDROPLASTY;  Surgeon: Adah Acron, MD;  Location: [facility];  Service: Orthopedics;  Laterality: Left;   COLONOSCOPY     CT CTA CORONARY W/CA SCORE W/CM &/OR WO/CM  12/02/2016   CORONARY CALCIUM  SCORE 0.  NO EVIDENCE OF CAD.  Normal-sized PA.  Normal pulmonary venous drainage the left atrium.  No ASD or VSD.   ELBOW FRACTURE SURGERY     right   ESOPHAGOGASTRODUODENOSCOPY (EGD) WITH PROPOFOL  N/A 04/04/2022   Procedure: ESOPHAGOGASTRODUODENOSCOPY (EGD) WITH PROPOFOL ;  Surgeon: Normie Becton., MD;  Location:  [facility] ENDOSCOPY;  Service: Gastroenterology;  Laterality: N/A;   EUS N/A 04/04/2022   Procedure: UPPER ENDOSCOPIC ULTRASOUND (EUS) RADIAL;  Surgeon: Normie Becton., MD;  Location: [facility] ENDOSCOPY;  Service: Gastroenterology;  Laterality: N/A;   EYE SURGERY Bilateral    cataracts removed   INSERTION OF MESH N/A 11/12/2022   Procedure: INSERTION OF MESH;  Surgeon: Shela Derby, MD;  Location: [facility] OR;  Service: General;  Laterality: N/A;   KNEE ARTHROSCOPY WITH EXCISION PLICA Left 01/06/2015   Procedure: KNEE ARTHROSCOPY WITH EXCISION PLICA;  Surgeon: Adah Acron, MD;  Location: [facility];  Service: Orthopedics;  Laterality: Left;   LIGAMENT REPAIR Right 11/28/2021   Procedure: RIGHT THUMB LIGAMENT RECONSTRUCTION AND TENDON INTERPOSITION;  Surgeon: Marilyn Shropshire, MD;  Location: [facility] OR;  Service: Orthopedics;  Laterality: Right;   Lower  Extremity Arterial Dopplers  07/06/2013   RABI 1.0, LABI 1.1.; No evidence of significant vascular atherogenic plaque   NECK SURGERY     NM MYOVIEW  LTD  09/2014   a) LOW RISK. Normal EF of 65%, no RWMA. NO ISCHEMIA OR INFARCTION.;; b) 05/02/2009: Normal study.  LOW RISK.  No ischemia or infarction.  EF 74%.  Breast attenuation noted.   TONSILLECTOMY     TRANSTHORACIC ECHOCARDIOGRAM  03/28/2021   a) Normal EF 60 to 65%.  GR 1 DD.  No R WMA.  Normal RV size and function.  Normal RAP.  Normal valves. => NORMAL;; b) 04/23/16/2010: Normal study.  LOW RISK.  No ischemia or infarction.  EF 74%.  Breast attenuation noted.17/2010: ([facility]) EF>55%.  Normal wall motion.  Normal valves.   WRIST FRACTURE SURGERY     left, has plate   XI ROBOTIC ASSISTED PARAESOPHAGEAL HERNIA REPAIR N/A 11/12/2022   Procedure: ROBOTIC DIAPHRAGMATIC HERNIA REPAIR WITH MESH;  Surgeon: Shela Derby, MD;  Location: [facility] OR;  Service: General;  Laterality: N/A;    reports that she has been smoking cigarettes. She started smoking about 46 years ago. She has a 23.2 pack-year smoking history. She has never used smokeless tobacco. She reports that she does not drink alcohol and does not use drugs. family history includes Dementia in her mother; Diabetes in her maternal aunt, maternal grandmother, and another family member; Emphysema in her father; Heart disease in her brother. Allergies  Allergen Reactions   Advil  [Ibuprofen ] Nausea And Vomiting   Bee Venom Swelling   Codeine  Nausea Only and Other (See Comments)    CAUSES ULCERS   Clonidine Derivatives Other (See Comments)    Unknown    Doxycycline  Other (See Comments)    nausea   Statins Other (See Comments)    unknown   Latex Rash   Levaquin  [Levofloxacin ] Nausea Only   Propoxyphene N-Acetaminophen  Nausea Only   Current Outpatient Medications on File Prior to Visit  Medication Sig Dispense Refill   albuterol  (PROAIR  HFA) 108 (90 Base) MCG/ACT inhaler Inhale 2 puffs into the  lungs every 6 (six) hours as needed for wheezing or shortness of breath. 1 each 2   ALPRAZolam  (XANAX ) 1 MG tablet TAKE 1 TABLET BY MOUTH AT BEDTIME AS NEEDED FOR ANXIETY 90 tablet 1   Budeson-Glycopyrrol-Formoterol  (BREZTRI  AEROSPHERE) 160-9-4.8 MCG/ACT AERO Inhale 2 puffs once a day with spacer to help prevent cough and wheeze.  Rinse mouth out afterwards.  During increased symptoms/sickness increase Breztri  to 2 puffs twice a day with spacer until symptoms return to baseline 10.7 g 2   cetirizine  (ZYRTEC  ALLERGY) 10 MG tablet take1 tablet  by mouth once a day as needed for runny nose/drainage down throat 30 tablet 3   dexlansoprazole  (DEXILANT ) 60 MG capsule Take 1 capsule (60 mg total) by mouth daily. 90 capsule 3   EPINEPHrine  0.3 mg/0.3 mL IJ SOAJ injection Inject 0.3 mg into the muscle as needed for anaphylaxis. Do not refrigerate. 2 each 1   Erenumab-aooe 140 MG/ML SOAJ Inject 140 mg into the skin every 30 (thirty) days.     FASENRA  30 MG/ML prefilled syringe INJECT 30MG  SUBCUTANEOUSLY EVERY 8 WEEKS 1 mL 6   FLUoxetine  (PROZAC ) 20 MG capsule Take 1 capsule (20 mg total) by mouth daily. 90 capsule 3   gabapentin  (NEURONTIN ) 600 MG tablet Take 600 mg by mouth 2 (two) times daily.     levalbuterol  (XOPENEX  HFA) 45 MCG/ACT inhaler Inhale 2 puffs every 4-6 hours as needed for cough, wheeze, tightness in chest, or shortness of breath 1 each 1   meclizine  (ANTIVERT ) 12.5 MG tablet TAKE 1 TABLET BY MOUTH THREE TIMES DAILY AS NEEDED FOR DIZZINESS 60 tablet 0   montelukast  (SINGULAIR ) 10 MG tablet Take 1 tablet (10 mg total) by mouth at bedtime. 90 tablet 1   Naloxone HCl 4 MG/0.25ML LIQD Place 1 spray into the nose as needed.     nitroGLYCERIN  (NITROSTAT ) 0.4 MG SL tablet Place 1 tablet (0.4 mg total) under the tongue every 5 (five) minutes as needed for chest pain. 25 tablet 3   nystatin  cream (MYCOSTATIN ) Apply 1 Application topically 2 (two) times daily. 60 g 0   promethazine  (PHENERGAN ) 12.5 MG  tablet Take 1 tablet (12.5 mg total) by mouth every 8 (eight) hours as needed for nausea or vomiting. 60 tablet 1   tiZANidine  (ZANAFLEX ) 2 MG tablet Take 1 tablet (2 mg total) by mouth every 8 (eight) hours as needed for muscle spasms. 40 tablet 1   traMADol  (ULTRAM ) 50 MG tablet Take 1 tablet (50 mg total) by mouth every 6 (six) hours as needed. 60 tablet 2   valACYclovir  (VALTREX ) 500 MG tablet Take 1 tablet (500 mg total) by mouth 2 (two) times daily. 14 tablet 2   zolpidem  (AMBIEN ) 5 MG tablet Take 1 tablet (5 mg total) by mouth at bedtime as needed for sleep. 90 tablet 1   Current Facility-Administered Medications on File Prior to Visit  Medication Dose Route Frequency Provider Last Rate Last Admin   Benralizumab  SOSY 30 mg  30 mg Subcutaneous Q8 weeks Gallagher, Joel Louis, MD   30 mg at 12/22/23 0908   [START ON 01/15/2024] denosumab  (PROLIA ) injection 60 mg  60 mg Subcutaneous Once Roslyn Coombe, MD            ROS:  All others reviewed and negative.  Objective        PE:  BP 120/84   Pulse 61   Temp 98.1 F (36.7 C) (Temporal)   Ht 5\' 4"  (1.626 m)   Wt 114 lb 8 oz (51.9 kg)   SpO2 96%   BMI 19.65 kg/m                 Constitutional: Pt appears in NAD               HENT: Head: NCAT.                Right Ear: External ear normal.                 Left Ear: External ear normal.  Eyes: . Pupils are equal, round, and reactive to light. Conjunctivae and EOM are normal               Nose: without d/c or deformity               Neck: Neck supple. Gross normal ROM               Cardiovascular: Normal rate and regular rhythm.                 Pulmonary/Chest: Effort normal and breath sounds without rales or wheezing.                Abd:  Soft, NT, ND, + BS, no organomegaly               Neurological: Pt is alert. At baseline orientation, motor grossly intact               Skin: Skin is warm. No rashes, no other new lesions, LE edema - none               Psychiatric: Pt  behavior is normal without agitation   Micro: none  Cardiac tracings I have personally interpreted today:  none  Pertinent Radiological findings (summarize): none   Lab Results  Component Value Date   WBC 7.8 12/31/2023   HGB 12.6 12/31/2023   HCT 37.4 12/31/2023   PLT 280 12/31/2023   GLUCOSE 83 12/31/2023   CHOL 264 (H) 12/29/2023   TRIG 312.0 (H) 12/29/2023   HDL 42.00 12/29/2023   LDLDIRECT 178.0 04/09/2023   LDLCALC 159 (H) 12/29/2023   ALT 7 12/29/2023   AST 14 12/29/2023   NA 135 12/31/2023   K 4.4 12/31/2023   CL 101 12/31/2023   CREATININE 1.28 (H) 12/31/2023   BUN 15 12/31/2023   CO2 28 12/31/2023   TSH 2.26 12/29/2023   INR 0.9 08/31/2008   HGBA1C 5.4 12/29/2023   Assessment/Plan:  Lori [name] is a 68 y.o. White or Caucasian [1] female with  has a past medical history of Allergic rhinitis, Anemia, Ankle fracture (05/2016), Anxiety, Arthritis, Asthma, Barrett esophagus (2007), Chest pain, COPD (chronic obstructive pulmonary disease) with chronic bronchitis (HCC), Depression, Duodenitis without mention of hemorrhage (2007), Esophageal reflux (2007), Esophageal stricture, Full dentures, H/O hiatal hernia, Hiatal hernia (4098,1191), HSV infection, Hyperlipidemia, Multiple fractures, Pneumonia (10/2016), Restrictive lung disease (06/30/2017), and Ulcer.  AKI (acute kidney injury) (HCC) Lab Results  Component Value Date   CREATININE 1.28 (H) 12/31/2023  Improved on f/u lab, pt has been hydrating normally since then, declines lab today  Low back pain LS spine films still pending from apr 14 to r/o fx, pt to be notified regarding MRI and possible NS referral for kyphoplasty if fracture is seen  Osteoporosis Pt to start prolia  soon if affordable  Vitamin D  deficiency Last vitamin D  Lab Results  Component Value Date   VD25OH 41.11 12/29/2023   Stable, cont oral replacement   B12 deficiency Lab Results  Component Value Date   VITAMINB12 132 (L)  12/29/2023   Low, reminded to start oral replacement - b12 1000 mcg qd  Followup: Return if symptoms worsen or fail to improve.  Rosalia Colonel, MD 01/07/2024 9:01 AM [facility]

## 2024-01-07 NOTE — Assessment & Plan Note (Signed)
 LS spine films still pending from apr 14 to r/o fx, pt to be notified regarding MRI and possible NS referral for kyphoplasty if fracture is seen

## 2024-01-07 NOTE — Assessment & Plan Note (Signed)
 Lab Results  Component Value Date   CREATININE 1.28 (H) 12/31/2023  Improved on f/u lab, pt has been hydrating normally since then, declines lab today

## 2024-01-07 NOTE — Assessment & Plan Note (Signed)
 Last vitamin D  Lab Results  Component Value Date   VD25OH 41.11 12/29/2023   Stable, cont oral replacement

## 2024-01-07 NOTE — Assessment & Plan Note (Signed)
 Pt to start prolia  soon if affordable

## 2024-01-08 ENCOUNTER — Telehealth: Payer: Self-pay | Admitting: Internal Medicine

## 2024-01-08 NOTE — Telephone Encounter (Signed)
 Copied from CRM [id number]. Topic: Clinical - Medical Advice >> Jan 08, 2024  1:16 PM [place] J wrote: Reason for CRM: Patient wanted to ask Dr. Autry Legions if it would be a good idea to schedule a bone density appointment? If so, she would like to schedule.  ---  Looks like pt just had a bone density on 4.4.25, would she need another one so soon?

## 2024-01-09 NOTE — Telephone Encounter (Signed)
 Pt does not need another bone density, She just had one done.

## 2024-01-12 ENCOUNTER — Other Ambulatory Visit (HOSPITAL_COMMUNITY): Payer: Self-pay

## 2024-01-12 ENCOUNTER — Ambulatory Visit (INDEPENDENT_AMBULATORY_CARE_PROVIDER_SITE_OTHER)

## 2024-01-12 DIAGNOSIS — J309 Allergic rhinitis, unspecified: Secondary | ICD-10-CM | POA: Diagnosis not present

## 2024-01-12 NOTE — Telephone Encounter (Signed)
 Pt ready for scheduling for PROLIA  on or after : 01/12/24  Option# 1: Buy/Bill (Office supplied medication)  Out-of-pocket cost due at time of clinic visit: $357  Number of injection/visits approved: ---  Primary: MEDICARE Prolia  co-insurance: 20% Admin fee co-insurance: 20%  Secondary: --- Prolia  co-insurance:  Admin fee co-insurance:   Medical Benefit Details: Date Benefits were checked: 01/09/24 Deductible: $257 Met of $257 Required/ Coinsurance: 20%/ Admin Fee: 20%  Prior Auth: N/A PA# Expiration Date:   # of doses approved: ----------------------------------------------------------------------- Option# 2- Med Obtained from pharmacy:  Pharmacy benefit: Copay $0 (Paid to pharmacy) Admin Fee: 20% (Pay at clinic)  Prior Auth: N/A PA# Expiration Date:   # of doses approved:   If patient wants fill through the pharmacy benefit please send prescription to:  [facility] , and include estimated need by date in rx notes. Pharmacy will ship medication directly to the office.  Patient NOT eligible for Prolia  Copay Card. Copay Card can make patient's cost as little as $25. Link to apply: [email or web address]://[email or web address]  ** This summary of benefits is an estimation of the patient's out-of-pocket cost. Exact cost may very based on individual plan coverage.

## 2024-01-12 NOTE — Telephone Encounter (Signed)
 Lori [name]

## 2024-01-12 NOTE — Telephone Encounter (Signed)
 Copied from CRM [id number]. Topic: General - Other >> Jan 12, 2024 11:18 AM Lori [name] wrote: Reason for CRM: Pt wanted to get callback from Xray for images

## 2024-01-13 ENCOUNTER — Other Ambulatory Visit: Payer: Self-pay | Admitting: Internal Medicine

## 2024-01-13 ENCOUNTER — Other Ambulatory Visit: Payer: Self-pay

## 2024-01-13 ENCOUNTER — Encounter: Payer: Self-pay | Admitting: Internal Medicine

## 2024-01-13 ENCOUNTER — Ambulatory Visit: Payer: Self-pay

## 2024-01-13 NOTE — Telephone Encounter (Signed)
 Copied from CRM [id number]. Topic: Clinical - Lab/Test Results >> Jan 13, 2024  4:10 PM Tiffany S wrote: Reason for CRM: Patient called wanting imaging results please follow up with patient

## 2024-01-14 NOTE — Telephone Encounter (Signed)
 Called and left voice mail

## 2024-01-15 ENCOUNTER — Ambulatory Visit: Payer: Self-pay | Admitting: *Deleted

## 2024-01-15 ENCOUNTER — Emergency Department (HOSPITAL_COMMUNITY)

## 2024-01-15 ENCOUNTER — Inpatient Hospital Stay (HOSPITAL_COMMUNITY)
Admission: EM | Admit: 2024-01-15 | Discharge: 2024-01-19 | DRG: 202 | Disposition: A | Discharge status: Home Health | Attending: Internal Medicine | Admitting: Internal Medicine

## 2024-01-15 ENCOUNTER — Other Ambulatory Visit: Payer: Self-pay

## 2024-01-15 DIAGNOSIS — Z91040 Latex allergy status: Secondary | ICD-10-CM

## 2024-01-15 DIAGNOSIS — F32A Depression, unspecified: Secondary | ICD-10-CM | POA: Diagnosis present

## 2024-01-15 DIAGNOSIS — J432 Centrilobular emphysema: Secondary | ICD-10-CM | POA: Diagnosis not present

## 2024-01-15 DIAGNOSIS — Z888 Allergy status to other drugs, medicaments and biological substances status: Secondary | ICD-10-CM | POA: Diagnosis not present

## 2024-01-15 DIAGNOSIS — Z7951 Long term (current) use of inhaled steroids: Secondary | ICD-10-CM | POA: Diagnosis not present

## 2024-01-15 DIAGNOSIS — J45901 Unspecified asthma with (acute) exacerbation: Secondary | ICD-10-CM | POA: Diagnosis not present

## 2024-01-15 DIAGNOSIS — Z886 Allergy status to analgesic agent status: Secondary | ICD-10-CM | POA: Diagnosis not present

## 2024-01-15 DIAGNOSIS — Z885 Allergy status to narcotic agent status: Secondary | ICD-10-CM | POA: Diagnosis not present

## 2024-01-15 DIAGNOSIS — Z833 Family history of diabetes mellitus: Secondary | ICD-10-CM

## 2024-01-15 DIAGNOSIS — K219 Gastro-esophageal reflux disease without esophagitis: Secondary | ICD-10-CM | POA: Diagnosis present

## 2024-01-15 DIAGNOSIS — R251 Tremor, unspecified: Secondary | ICD-10-CM | POA: Diagnosis not present

## 2024-01-15 DIAGNOSIS — Z79899 Other long term (current) drug therapy: Secondary | ICD-10-CM

## 2024-01-15 DIAGNOSIS — F17210 Nicotine dependence, cigarettes, uncomplicated: Secondary | ICD-10-CM | POA: Diagnosis present

## 2024-01-15 DIAGNOSIS — D72828 Other elevated white blood cell count: Secondary | ICD-10-CM | POA: Diagnosis present

## 2024-01-15 DIAGNOSIS — Z8249 Family history of ischemic heart disease and other diseases of the circulatory system: Secondary | ICD-10-CM | POA: Diagnosis not present

## 2024-01-15 DIAGNOSIS — J441 Chronic obstructive pulmonary disease with (acute) exacerbation: Secondary | ICD-10-CM | POA: Diagnosis not present

## 2024-01-15 DIAGNOSIS — Z66 Do not resuscitate: Secondary | ICD-10-CM | POA: Diagnosis present

## 2024-01-15 DIAGNOSIS — B009 Herpesviral infection, unspecified: Secondary | ICD-10-CM | POA: Diagnosis present

## 2024-01-15 DIAGNOSIS — R079 Chest pain, unspecified: Secondary | ICD-10-CM | POA: Diagnosis not present

## 2024-01-15 DIAGNOSIS — G8929 Other chronic pain: Secondary | ICD-10-CM | POA: Diagnosis present

## 2024-01-15 DIAGNOSIS — R0600 Dyspnea, unspecified: Secondary | ICD-10-CM | POA: Diagnosis not present

## 2024-01-15 DIAGNOSIS — R0789 Other chest pain: Secondary | ICD-10-CM | POA: Diagnosis not present

## 2024-01-15 DIAGNOSIS — R0602 Shortness of breath: Secondary | ICD-10-CM | POA: Diagnosis not present

## 2024-01-15 DIAGNOSIS — Z716 Tobacco abuse counseling: Secondary | ICD-10-CM

## 2024-01-15 DIAGNOSIS — Z825 Family history of asthma and other chronic lower respiratory diseases: Secondary | ICD-10-CM

## 2024-01-15 DIAGNOSIS — Z881 Allergy status to other antibiotic agents status: Secondary | ICD-10-CM

## 2024-01-15 DIAGNOSIS — I2510 Atherosclerotic heart disease of native coronary artery without angina pectoris: Secondary | ICD-10-CM | POA: Diagnosis not present

## 2024-01-15 DIAGNOSIS — J849 Interstitial pulmonary disease, unspecified: Secondary | ICD-10-CM | POA: Diagnosis present

## 2024-01-15 DIAGNOSIS — Z9049 Acquired absence of other specified parts of digestive tract: Secondary | ICD-10-CM

## 2024-01-15 DIAGNOSIS — G4700 Insomnia, unspecified: Secondary | ICD-10-CM | POA: Diagnosis not present

## 2024-01-15 DIAGNOSIS — M79606 Pain in leg, unspecified: Secondary | ICD-10-CM | POA: Diagnosis present

## 2024-01-15 DIAGNOSIS — E785 Hyperlipidemia, unspecified: Secondary | ICD-10-CM | POA: Diagnosis not present

## 2024-01-15 DIAGNOSIS — Z90710 Acquired absence of both cervix and uterus: Secondary | ICD-10-CM

## 2024-01-15 DIAGNOSIS — F419 Anxiety disorder, unspecified: Secondary | ICD-10-CM | POA: Diagnosis present

## 2024-01-15 DIAGNOSIS — M810 Age-related osteoporosis without current pathological fracture: Secondary | ICD-10-CM | POA: Diagnosis present

## 2024-01-15 DIAGNOSIS — R918 Other nonspecific abnormal finding of lung field: Secondary | ICD-10-CM | POA: Diagnosis not present

## 2024-01-15 LAB — HEPATIC FUNCTION PANEL
ALT: 9 U/L (ref 0–44)
AST: 16 U/L (ref 15–41)
Albumin: 4.4 g/dL (ref 3.5–5.0)
Alkaline Phosphatase: 74 U/L (ref 38–126)
Bilirubin, Direct: <0.1 mg/dL (ref 0.0–0.2)
Total Bilirubin: 0.6 mg/dL (ref 0.0–1.2)
Total Protein: 7.2 g/dL (ref 6.5–8.1)

## 2024-01-15 LAB — CBC
HCT: 41 % (ref 36.0–46.0)
Hemoglobin: 13.6 g/dL (ref 12.0–15.0)
MCH: 31.1 pg (ref 26.0–34.0)
MCHC: 33.2 g/dL (ref 30.0–36.0)
MCV: 93.6 fL (ref 80.0–100.0)
Platelets: 293 10*3/uL (ref 150–400)
RBC: 4.38 MIL/uL (ref 3.87–5.11)
RDW: 13.3 % (ref 11.5–15.5)
WBC: 7.8 10*3/uL (ref 4.0–10.5)
nRBC: 0 % (ref 0.0–0.2)

## 2024-01-15 LAB — TROPONIN I (HIGH SENSITIVITY)
Troponin I (High Sensitivity): 3 ng/L (ref <18)
Troponin I (High Sensitivity): 2 ng/L (ref <18)

## 2024-01-15 LAB — BASIC METABOLIC PANEL WITH GFR
Anion gap: 10 (ref 5–15)
BUN: 14 mg/dL (ref 8–23)
CO2: 23 mmol/L (ref 22–32)
Calcium: 9.5 mg/dL (ref 8.9–10.3)
Chloride: 104 mmol/L (ref 98–111)
Creatinine, Ser: 1.04 mg/dL — ABNORMAL HIGH (ref 0.44–1.00)
GFR, Estimated: 59 mL/min — ABNORMAL LOW (ref >60)
Glucose, Bld: 100 mg/dL — ABNORMAL HIGH (ref 70–99)
Potassium: 4.5 mmol/L (ref 3.5–5.1)
Sodium: 137 mmol/L (ref 135–145)

## 2024-01-15 LAB — I-STAT CG4 LACTIC ACID, ED
Lactic Acid, Venous: 2 mmol/L (ref 0.5–1.9)
Lactic Acid, Venous: 2 mmol/L (ref 0.5–1.9)

## 2024-01-15 MED ORDER — ALPRAZOLAM 0.5 MG PO TABS
1.0000 mg | ORAL_TABLET | Freq: Every evening | ORAL | Status: DC | PRN
  Administered 2024-01-15 – 2024-01-16 (×2): 1 mg via ORAL
  Filled 2024-01-15 (×2): qty 2

## 2024-01-15 MED ORDER — ALBUTEROL SULFATE (2.5 MG/3ML) 0.083% IN NEBU
10.0000 mg/h | INHALATION_SOLUTION | RESPIRATORY_TRACT | Status: DC
  Administered 2024-01-15: 10 mg/h via RESPIRATORY_TRACT
  Filled 2024-01-15: qty 12

## 2024-01-15 MED ORDER — ACETAMINOPHEN 500 MG PO TABS
500.0000 mg | ORAL_TABLET | Freq: Once | ORAL | Status: AC
  Administered 2024-01-15: 500 mg via ORAL
  Filled 2024-01-15: qty 1

## 2024-01-15 MED ORDER — TRAMADOL HCL 50 MG PO TABS
50.0000 mg | ORAL_TABLET | Freq: Four times a day (QID) | ORAL | Status: DC | PRN
  Administered 2024-01-17 – 2024-01-19 (×4): 50 mg via ORAL
  Filled 2024-01-15 (×4): qty 1

## 2024-01-15 MED ORDER — GUAIFENESIN 100 MG/5ML PO LIQD
5.0000 mL | ORAL | Status: DC | PRN
  Administered 2024-01-15 – 2024-01-19 (×8): 5 mL via ORAL
  Filled 2024-01-15 (×8): qty 10

## 2024-01-15 MED ORDER — SODIUM CHLORIDE 0.9 % IV BOLUS
1000.0000 mL | Freq: Once | INTRAVENOUS | Status: AC
  Administered 2024-01-15: 1000 mL via INTRAVENOUS

## 2024-01-15 MED ORDER — PANTOPRAZOLE SODIUM 40 MG PO TBEC
40.0000 mg | DELAYED_RELEASE_TABLET | Freq: Every day | ORAL | Status: DC
  Administered 2024-01-16 – 2024-01-19 (×4): 40 mg via ORAL
  Filled 2024-01-15 (×4): qty 1

## 2024-01-15 MED ORDER — IPRATROPIUM-ALBUTEROL 0.5-2.5 (3) MG/3ML IN SOLN
3.0000 mL | RESPIRATORY_TRACT | Status: AC
  Administered 2024-01-15 (×2): 3 mL via RESPIRATORY_TRACT
  Filled 2024-01-15: qty 6

## 2024-01-15 MED ORDER — IPRATROPIUM-ALBUTEROL 0.5-2.5 (3) MG/3ML IN SOLN: 3.0000 mL | Freq: Four times a day (QID) | RESPIRATORY_TRACT | Status: DC | PRN

## 2024-01-15 MED ORDER — SODIUM CHLORIDE 0.9 % IV SOLN: INTRAVENOUS | Status: AC

## 2024-01-15 MED ORDER — PREDNISONE 20 MG PO TABS
40.0000 mg | ORAL_TABLET | Freq: Every day | ORAL | Status: DC
  Administered 2024-01-16: 40 mg via ORAL
  Filled 2024-01-15: qty 2

## 2024-01-15 MED ORDER — NICOTINE 14 MG/24HR TD PT24
14.0000 mg | MEDICATED_PATCH | Freq: Every day | TRANSDERMAL | Status: DC
  Administered 2024-01-15 – 2024-01-19 (×5): 14 mg via TRANSDERMAL
  Filled 2024-01-15 (×5): qty 1

## 2024-01-15 MED ORDER — IOHEXOL 350 MG/ML SOLN
75.0000 mL | Freq: Once | INTRAVENOUS | Status: AC | PRN
  Administered 2024-01-15: 75 mL via INTRAVENOUS

## 2024-01-15 MED ORDER — BUDESON-GLYCOPYRROL-FORMOTEROL 160-9-4.8 MCG/ACT IN AERO
2.0000 | INHALATION_SPRAY | Freq: Every day | RESPIRATORY_TRACT | Status: DC
Start: 2024-01-15 — End: 2024-01-16
  Administered 2024-01-16: 2 via RESPIRATORY_TRACT
  Filled 2024-01-15: qty 5.9

## 2024-01-15 MED ORDER — SODIUM CHLORIDE 0.9 % IV BOLUS
500.0000 mL | Freq: Once | INTRAVENOUS | Status: AC
  Administered 2024-01-15: 500 mL via INTRAVENOUS

## 2024-01-15 MED ORDER — FLUOXETINE HCL 20 MG PO CAPS
20.0000 mg | ORAL_CAPSULE | Freq: Every day | ORAL | Status: DC
Start: 2024-01-16 — End: 2024-01-19
  Administered 2024-01-16 – 2024-01-19 (×4): 20 mg via ORAL
  Filled 2024-01-15 (×4): qty 1

## 2024-01-15 MED ORDER — METHYLPREDNISOLONE SODIUM SUCC 125 MG IJ SOLR
125.0000 mg | Freq: Once | INTRAMUSCULAR | Status: AC
  Administered 2024-01-15: 125 mg via INTRAVENOUS
  Filled 2024-01-15: qty 2

## 2024-01-15 MED ORDER — MONTELUKAST SODIUM 10 MG PO TABS
10.0000 mg | ORAL_TABLET | Freq: Every day | ORAL | Status: DC
  Administered 2024-01-15 – 2024-01-18 (×4): 10 mg via ORAL
  Filled 2024-01-15 (×4): qty 1

## 2024-01-15 MED ORDER — ENOXAPARIN SODIUM 40 MG/0.4ML IJ SOSY
40.0000 mg | PREFILLED_SYRINGE | INTRAMUSCULAR | Status: DC
  Administered 2024-01-15 – 2024-01-18 (×4): 40 mg via SUBCUTANEOUS
  Filled 2024-01-15 (×4): qty 0.4

## 2024-01-15 MED ORDER — VALACYCLOVIR HCL 500 MG PO TABS
500.0000 mg | ORAL_TABLET | Freq: Two times a day (BID) | ORAL | Status: DC
  Administered 2024-01-15 – 2024-01-19 (×8): 500 mg via ORAL
  Filled 2024-01-15 (×8): qty 1

## 2024-01-15 MED ORDER — LORATADINE 10 MG PO TABS: 10.0000 mg | ORAL_TABLET | Freq: Every day | ORAL | Status: DC | PRN

## 2024-01-15 MED ORDER — CARMEX CLASSIC LIP BALM EX OINT: TOPICAL_OINTMENT | CUTANEOUS | Status: DC | PRN

## 2024-01-15 MED ORDER — GABAPENTIN 300 MG PO CAPS
600.0000 mg | ORAL_CAPSULE | Freq: Every day | ORAL | Status: DC
  Administered 2024-01-15 – 2024-01-18 (×4): 600 mg via ORAL
  Filled 2024-01-15 (×4): qty 2

## 2024-01-15 NOTE — Telephone Encounter (Signed)
 OK to encourage to at least go to urgent care    thanks

## 2024-01-15 NOTE — Telephone Encounter (Signed)
 Patient did not want to verify name or DOB reports she already "did that". Other person on phone Marily Shows reports patient needs to see a Dr now.  Only answered few questions for NT. Chief Complaint: chest pain, uncontrollable shaking and can hardly stand or walk. Symptoms: see above  Frequency: na Pertinent Negatives: Patient denies na Disposition: [x] ED /[] Urgent Care (no appt availability in office) / [] Appointment(In office/virtual)/ []  [facility]/ [] Home Care/ [] Refused Recommended Disposition /[] [place] [facility]/ []  Follow-up with PCP Additional Notes:      Copied from CRM [id number]. Topic: Clinical  Recommended patient and other caller to take patient to ED now due to demand that patient needs to see a Dr. Now. Caller reports patient will be taken to [facility] ED.      - Red Word Triage >> Jan 15, 2024  9:53 AM Alethia Huxley E wrote: [facility] that prompted transfer to Nurse Triage: Body shakes. Patient states she has had uncontrollable shaking throughout her entire body for the past 2 days to the point where she can hardly walk. Reason for Disposition  Unable to complete triage due to phone connection issues    Patient and other caller on line did not want to complete triage  Answer Assessment - Initial Assessment Questions N/A Caller refused to verify name and DOB  and other caller Marily Shows  on line reports patient needs to see a Dr now. Reported chest pain and uncontrollable shaking difficulty standing.  Protocols used: No Contact or Duplicate Contact Call-A-AH

## 2024-01-15 NOTE — ED Provider Notes (Addendum)
 [place] EMERGENCY DEPARTMENT AT [facility] Provider Note   CSN: [id number] Arrival date & time: 01/15/24  1023     History  Chief Complaint  Patient presents with   Tremors   Chest Pain    Lori [name] is a 68 y.o. female.  68 yo F with a chief complaints of difficulty breathing and fatigue and shaking.  So this has been going on for a couple days.  She actually has been coughing and congested for a little bit longer than that.  Has a history of COPD and is not sure of her difficulty breathing is due to that or not.  She is worried that she has pneumonia.  Has a little bit of chest discomfort.  Denies abdominal pain.   Chest Pain      Home Medications Prior to Admission medications   Medication Sig Start Date End Date Taking? Authorizing Provider  meclizine  (ANTIVERT ) 12.5 MG tablet TAKE 1 TABLET BY MOUTH THREE TIMES DAILY AS NEEDED FOR DIZZINESS 01/13/24   Roslyn Coombe, MD  albuterol  (PROAIR  HFA) [phone] Base) MCG/ACT inhaler Inhale 2 puffs into the lungs every 6 (six) hours as needed for wheezing or shortness of breath. 12/27/22   Roslyn Coombe, MD  ALPRAZolam  (XANAX ) 1 MG tablet TAKE 1 TABLET BY MOUTH AT BEDTIME AS NEEDED FOR ANXIETY 11/19/23   Roslyn Coombe, MD  Budeson-Glycopyrrol-Formoterol  (BREZTRI  AEROSPHERE) 160-9-4.8 MCG/ACT AERO Inhale 2 puffs once a day with spacer to help prevent cough and wheeze.  Rinse mouth out afterwards.  During increased symptoms/sickness increase Breztri  to 2 puffs twice a day with spacer until symptoms return to baseline 11/04/23   Tinnie Forehand, FNP  cetirizine  (ZYRTEC  ALLERGY) 10 MG tablet take1 tablet by mouth once a day as needed for runny nose/drainage down throat 11/04/23   Tinnie Forehand, FNP  dexlansoprazole  (DEXILANT ) 60 MG capsule Take 1 capsule (60 mg total) by mouth daily. 06/06/23   Roslyn Coombe, MD  EPINEPHrine  0.3 mg/0.3 mL IJ SOAJ injection Inject 0.3 mg into the muscle as needed for anaphylaxis. Do not  refrigerate. 08/04/23   [place]ster Chuck, MD  Erenumab-aooe 140 MG/ML SOAJ Inject 140 mg into the skin every 30 (thirty) days.    [provider]  FASENRA  30 MG/ML prefilled syringe INJECT 30MG  SUBCUTANEOUSLY EVERY 8 WEEKS 09/21/23   Rochester Chuck, MD  FLUoxetine  (PROZAC ) 20 MG capsule Take 1 capsule (20 mg total) by mouth daily. 06/30/23 06/29/24  Roslyn Coombe, MD  gabapentin  (NEURONTIN ) 600 MG tablet Take 600 mg by mouth 2 (two) times daily. 04/30/22   [provider]  levalbuterol  (XOPENEX  HFA) 45 MCG/ACT inhaler Inhale 2 puffs every 4-6 hours as needed for cough, wheeze, tightness in chest, or shortness of breath 11/04/23   Tinnie Forehand, FNP  montelukast  (SINGULAIR ) 10 MG tablet Take 1 tablet (10 mg total) by mouth at bedtime. 11/04/23   Tinnie Forehand, FNP  Naloxone HCl 4 MG/0.25ML LIQD Place 1 spray into the nose as needed.    [provider]  nitroGLYCERIN  (NITROSTAT ) 0.4 MG SL tablet Place 1 tablet (0.4 mg total) under the tongue every 5 (five) minutes as needed for chest pain. 06/06/23   Roslyn Coombe, MD  nystatin  cream (MYCOSTATIN ) Apply 1 Application topically 2 (two) times daily. 09/19/23   Wellington Half, FNP  promethazine  (PHENERGAN ) 12.5 MG tablet Take 1 tablet (12.5 mg total) by mouth every 8 (eight) hours as needed for nausea or  vomiting. 06/30/23   Roslyn Coombe, MD  tiZANidine  (ZANAFLEX ) 2 MG tablet Take 1 tablet (2 mg total) by mouth every 8 (eight) hours as needed for muscle spasms. 12/02/23   Roslyn Coombe, MD  traMADol  (ULTRAM ) 50 MG tablet Take 1 tablet (50 mg total) by mouth every 6 (six) hours as needed. 12/29/23   Roslyn Coombe, MD  valACYclovir  (VALTREX ) 500 MG tablet Take 1 tablet (500 mg total) by mouth 2 (two) times daily. 09/19/23   Wellington Half, FNP  zolpidem  (AMBIEN ) 5 MG tablet Take 1 tablet (5 mg total) by mouth at bedtime as needed for sleep. 12/10/23   Roslyn Coombe, MD      Allergies    Advil  [ibuprofen ], Bee  venom, Codeine , Clonidine derivatives, Doxycycline , Statins, Latex, Levaquin  [levofloxacin ], and Propoxyphene n-acetaminophen     Review of Systems   Review of Systems  Cardiovascular:  Positive for chest pain.    Physical Exam Updated Vital Signs BP 121/70   Pulse 88   Temp 98.4 F (36.9 C)   Resp 19   Wt 52.2 kg   SpO2 90%   BMI 19.74 kg/m  Physical Exam Vitals and nursing note reviewed.  Constitutional:      General: She is not in acute distress.    Appearance: She is well-developed. She is not diaphoretic.  HENT:     Head: Normocephalic and atraumatic.  Eyes:     Pupils: Pupils are equal, round, and reactive to light.  Cardiovascular:     Rate and Rhythm: Normal rate and regular rhythm.     Heart sounds: No murmur heard.    No friction rub. No gallop.  Pulmonary:     Effort: Pulmonary effort is normal. Tachypnea present.     Breath sounds: No wheezing or rales.     Comments: Mildly diminished breath sounds in all fields.  No obvious wheezes. Abdominal:     General: There is no distension.     Palpations: Abdomen is soft.     Tenderness: There is no abdominal tenderness.  Musculoskeletal:        General: No tenderness.     Cervical back: Normal range of motion and neck supple.  Skin:    General: Skin is warm and dry.  Neurological:     Mental Status: She is alert and oriented to person, place, and time.  Psychiatric:        Behavior: Behavior normal.     ED Results / Procedures / Treatments   Labs (all labs ordered are listed, but only abnormal results are displayed) Labs Reviewed  BASIC METABOLIC PANEL WITH GFR - Abnormal; Notable for the following components:      Result Value   Glucose, Bld 100 (*)    Creatinine, Ser 1.04 (*)    GFR, Estimated 59 (*)    All other components within normal limits  I-STAT CG4 LACTIC ACID, ED - Abnormal; Notable for the following components:   Lactic Acid, Venous 2.0 (*)    All other components within normal limits   I-STAT CG4 LACTIC ACID, ED - Abnormal; Notable for the following components:   Lactic Acid, Venous 2.0 (*)    All other components within normal limits  CULTURE, BLOOD (ROUTINE X 2)  CULTURE, BLOOD (ROUTINE X 2)  CBC  HEPATIC FUNCTION PANEL  TROPONIN I (HIGH SENSITIVITY)  TROPONIN I (HIGH SENSITIVITY)    EKG EKG Interpretation Date/Time:  Thursday Jan 15 2024 10:35:36 EDT Ventricular Rate:  81 PR Interval:  109 QRS Duration:  86 QT Interval:  387 QTC Calculation: 450 R Axis:   83  Text Interpretation: Sinus rhythm Short PR interval Consider right atrial enlargement Borderline right axis deviation No significant change since last tracing Confirmed by Albertus Hughs [phone]) on 01/15/2024 10:54:21 AM  Radiology CT Angio Chest Pulmonary Embolism (PE) W or WO Contrast Result Date: 01/15/2024 CLINICAL DATA:  Pulmonary embolism (PE) suspected, high prob, tremors, central chest pain EXAM: CT ANGIOGRAPHY CHEST WITH CONTRAST TECHNIQUE: Multidetector CT imaging of the chest was performed using the standard protocol during bolus administration of intravenous contrast. Multiplanar CT image reconstructions and MIPs were obtained to evaluate the vascular anatomy. RADIATION DOSE REDUCTION: This exam was performed according to the departmental dose-optimization program which includes automated exposure control, adjustment of the mA and/or kV according to patient size and/or use of iterative reconstruction technique. CONTRAST:  75mL OMNIPAQUE  IOHEXOL  350 MG/ML SOLN COMPARISON:  May 14, 2023, February 16, 2017 FINDINGS: Pulmonary Embolism: No pulmonary embolism. Cardiovascular: No cardiomegaly or pericardial effusion. No aortic aneurysm. Scattered aortic and coronary artery atherosclerosis. Mediastinum/Nodes: No mediastinal mass. No mediastinal, hilar, or axillary lymphadenopathy. Lungs/Pleura: The trachea is midline and patent. Biapical pleuroparenchymal scarring with bullous change along the medial right lung apex.  Mild diffuse bronchial wall thickening with areas of mucous plugging in both lower lobes. Moderate centrilobular emphysema. No focal airspace consolidation, pleural effusion, or pneumothorax. Unchanged 3 mm subpleural nodule in the left upper lobe (axial 21). Musculoskeletal: No acute fracture or destructive bone lesion. Chronic, healed left anterior rib fractures. Osteopenia. Multilevel degenerative disc disease of the spine. Upper Abdomen: No acute abnormality in the partially visualized upper abdomen. Cholecystectomy. Review of the MIP images confirms the above findings. IMPRESSION: 1. No pulmonary embolism. 2. Moderate centrilobular emphysema. Diffuse bronchial wall thickening with areas of mucous plugging in both lower lobes, may reflect changes of acute or chronic bronchitis. No superimposed pneumonia, pleural effusion, or pulmonary edema. Aortic Atherosclerosis (ICD10-I70.0) and Emphysema (ICD10-J43.9). Electronically Signed   By: Rance Burrows M.D.   On: 01/15/2024 15:02   DG Chest Port 1 View Result Date: 01/15/2024 CLINICAL DATA:  Shortness of breath and chest pain. EXAM: PORTABLE CHEST 1 VIEW COMPARISON:  09/30/2023 FINDINGS: Lungs appear hyperexpanded. The lungs are clear without focal pneumonia, edema, pneumothorax or pleural effusion. The cardiopericardial silhouette is within normal limits for size. Nonacute left rib fractures evident. Telemetry leads overlie the chest. IMPRESSION: Hyperexpansion without acute cardiopulmonary findings. Electronically Signed   By: Donnal Fusi M.D.   On: 01/15/2024 12:30    Procedures .Critical Care  Performed by: Albertus Hughs, DO Authorized by: Albertus Hughs, DO   Critical care provider statement:    Critical care time (minutes):  35   Critical care time was exclusive of:  Separately billable procedures and treating other patients   Critical care was time spent personally by me on the following activities:  Development of treatment plan with patient or  surrogate, discussions with consultants, evaluation of patient's response to treatment, examination of patient, ordering and review of laboratory studies, ordering and review of radiographic studies, ordering and performing treatments and interventions, pulse oximetry, re-evaluation of patient's condition and review of old charts   Care discussed with: admitting provider       Medications Ordered in ED Medications  albuterol  (PROVENTIL ) (2.5 MG/3ML) 0.083% nebulizer solution (has no administration in time range)  sodium chloride  0.9 % bolus 500 mL (0 mLs Intravenous Stopped 01/15/24 1159)  ipratropium-albuterol  (  DUONEB) 0.5-2.5 (3) MG/3ML nebulizer solution 3 mL (3 mLs Nebulization Given 01/15/24 1201)  methylPREDNISolone  sodium succinate (SOLU-MEDROL ) 125 mg/2 mL injection 125 mg (125 mg Intravenous Given 01/15/24 1104)  iohexol  (OMNIPAQUE ) 350 MG/ML injection 75 mL (75 mLs Intravenous Contrast Given 01/15/24 1307)    ED Course/ Medical Decision Making/ A&P                                 Medical Decision Making Amount and/or Complexity of Data Reviewed Labs: ordered. Radiology: ordered.  Risk Prescription drug management.   68 yo F with a chief complaints of fatigue and difficulty breathing.  This has been going on for a couple days now.  She also been coughing congested she is worried that maybe she has pneumonia.  Will obtain a laboratory evaluation and chest x-ray.  Her breath sounds are not obviously consistent with COPD but she is slightly diminished will give 2 DuoNeb's and Solu-Medrol .  Trop negative, no significant electrolyte abnormalities.   Awaiting CT, likely admission.   The patients results and plan were reviewed and discussed.   Any x-rays performed were independently reviewed by myself.   Differential diagnosis were considered with the presenting HPI.  Medications  albuterol  (PROVENTIL ) (2.5 MG/3ML) 0.083% nebulizer solution (has no administration in time range)   sodium chloride  0.9 % bolus 500 mL (0 mLs Intravenous Stopped 01/15/24 1159)  ipratropium-albuterol  (DUONEB) 0.5-2.5 (3) MG/3ML nebulizer solution 3 mL (3 mLs Nebulization Given 01/15/24 1201)  methylPREDNISolone  sodium succinate (SOLU-MEDROL ) 125 mg/2 mL injection 125 mg (125 mg Intravenous Given 01/15/24 1104)  iohexol  (OMNIPAQUE ) 350 MG/ML injection 75 mL (75 mLs Intravenous Contrast Given 01/15/24 1307)    Vitals:   01/15/24 1330 01/15/24 1400 01/15/24 1430 01/15/24 1500  BP: 111/61 114/77 115/63 121/70  Pulse: 90 88 87 88  Resp: 16 18 19 19   Temp:  98.4 F (36.9 C)    TempSrc:      SpO2: 93% 95% 95% 90%  Weight:        Final diagnoses:  COPD exacerbation (HCC)    Admission/ observation were discussed with the admitting physician, patient and/or family and they are comfortable with the plan.          Final Clinical Impression(s) / ED Diagnoses Final diagnoses:  COPD exacerbation [facility])    Rx / DC Orders ED Discharge Orders     None         Albertus Hughs, DO 01/15/24 1511    Albertus Hughs, DO 01/15/24 1517

## 2024-01-15 NOTE — ED Triage Notes (Signed)
 Pt c/o tremors and central chest pain that began last night.   AOx4

## 2024-01-15 NOTE — H&P (Signed)
 History and Physical  DANAJHA WIEHE ZOX:[id number] DOB: [birth year] DOA: 01/15/2024  PCP: Roslyn Coombe, MD   Chief Complaint: SOB, fatigue and shaking  HPI: Lori [name] is a 68 y.o. female with medical history significant for anxiety and depression, COPD/ILD, GERD, HLD, HSV infection, allergic rhinitis on immunotherapy and osteoporosis who presented to the ED for evaluation of shortness of breath, fatigue and shaking.  Patient reports that a few days ago, she started having cough with congestion and some shortness of breath. This morning, she woke up shaking and was advised by her friends to come to the ED. She also reports wheezing and a persistent congested cough but unable to produce any phlegm.  She endorsed some fatigue but denies any fever, abdominal pain, nausea, vomiting, dysuria, headache or dizziness.  States she still smoking 1/2 ppd of cigarettes but denies any alcohol use or drug use.  ED Course: Initial vitals overall stable with patient's satting 98% on room air.  Labs significant for normal kidney function and electrolytes, normal LFTs, normal CBC, troponin 3->2, lactic acid 2.0->2.0.  EKG shows sinus rhythm with RAE. CXR with no acute cardiopulmonary disease. CTA PE study negative for PE but shows moderate centrilobular emphysema.  Patient received IV Solu-Medrol  125 mg x 1, IV NS 500 cc bolus and DuoNeb.  TRH was consulted for admission.  Review of Systems: Please see HPI for pertinent positives and negatives. A complete 10 system review of systems are otherwise negative.  Past Medical History:  Diagnosis Date   Allergic rhinitis    Anemia    hgb 13.9 on 11/22/21   Ankle fracture 05/2016   Anxiety    Arthritis    Asthma    Barrett esophagus 2007   Chest pain    Hx, no current problems as of 11/22/21.  Recurrent episodes.  Has had 3 negative nuclear stress test, and a completely normal coronary CT angiogram   COPD (chronic obstructive pulmonary disease) with chronic  bronchitis (HCC)    & Emphysema   Depression    Duodenitis without mention of hemorrhage 2007   Esophageal reflux 2007   Esophageal stricture    Full dentures    patient does not wear dentures as of 11/22/21   H/O hiatal hernia    Hiatal hernia 2006,2007   HSV infection    Hyperlipidemia    rx crestor  but pt not taking   Multiple fractures    from falls, fx rt. elbow, fx left wrist, bilateral ankles   Pneumonia 10/2016   Restrictive lung disease 06/30/2017   Ulcer    no current problems as of 11/22/21   Past Surgical History:  Procedure Laterality Date   ABDOMINAL HYSTERECTOMY     BSO   APPENDECTOMY     BIOPSY  04/04/2022   Procedure: BIOPSY;  Surgeon: Normie Becton., MD;  Location: [facility] ENDOSCOPY;  Service: Gastroenterology;;   CESAREAN SECTION     x 2   CHOLECYSTECTOMY     CHONDROPLASTY Left 01/06/2015   Procedure: CHONDROPLASTY;  Surgeon: Adah Acron, MD;  Location: [facility];  Service: Orthopedics;  Laterality: Left;   COLONOSCOPY     CT CTA CORONARY W/CA SCORE W/CM &/OR WO/CM  12/02/2016   CORONARY CALCIUM  SCORE 0.  NO EVIDENCE OF CAD.  Normal-sized PA.  Normal pulmonary venous drainage the left atrium.  No ASD or VSD.   ELBOW FRACTURE SURGERY     right   ESOPHAGOGASTRODUODENOSCOPY (EGD) WITH PROPOFOL  N/A 04/04/2022  Procedure: ESOPHAGOGASTRODUODENOSCOPY (EGD) WITH PROPOFOL ;  Surgeon: Brice Campi Albino Alu., MD;  Location: [facility] ENDOSCOPY;  Service: Gastroenterology;  Laterality: N/A;   EUS N/A 04/04/2022   Procedure: UPPER ENDOSCOPIC ULTRASOUND (EUS) RADIAL;  Surgeon: Normie Becton., MD;  Location: [facility] ENDOSCOPY;  Service: Gastroenterology;  Laterality: N/A;   EYE SURGERY Bilateral    cataracts removed   INSERTION OF MESH N/A 11/12/2022   Procedure: INSERTION OF MESH;  Surgeon: Shela Derby, MD;  Location: [facility] OR;  Service: General;  Laterality: N/A;   KNEE ARTHROSCOPY WITH EXCISION PLICA Left 01/06/2015   Procedure: KNEE ARTHROSCOPY WITH  EXCISION PLICA;  Surgeon: Adah Acron, MD;  Location: [facility];  Service: Orthopedics;  Laterality: Left;   LIGAMENT REPAIR Right 11/28/2021   Procedure: RIGHT THUMB LIGAMENT RECONSTRUCTION AND TENDON INTERPOSITION;  Surgeon: Marilyn Shropshire, MD;  Location: [facility] OR;  Service: Orthopedics;  Laterality: Right;   Lower Extremity Arterial Dopplers  07/06/2013   RABI 1.0, LABI 1.1.; No evidence of significant vascular atherogenic plaque   NECK SURGERY     NM MYOVIEW  LTD  09/2014   a) LOW RISK. Normal EF of 65%, no RWMA. NO ISCHEMIA OR INFARCTION.;; b) 05/02/2009: Normal study.  LOW RISK.  No ischemia or infarction.  EF 74%.  Breast attenuation noted.   TONSILLECTOMY     TRANSTHORACIC ECHOCARDIOGRAM  03/28/2021   a) Normal EF 60 to 65%.  GR 1 DD.  No R WMA.  Normal RV size and function.  Normal RAP.  Normal valves. => NORMAL;; b) 04/23/16/2010: Normal study.  LOW RISK.  No ischemia or infarction.  EF 74%.  Breast attenuation noted.17/2010: ([facility]) EF>55%.  Normal wall motion.  Normal valves.   WRIST FRACTURE SURGERY     left, has plate   XI ROBOTIC ASSISTED PARAESOPHAGEAL HERNIA REPAIR N/A 11/12/2022   Procedure: ROBOTIC DIAPHRAGMATIC HERNIA REPAIR WITH MESH;  Surgeon: Shela Derby, MD;  Location: [facility] OR;  Service: General;  Laterality: N/A;   Social History:  reports that she has been smoking cigarettes. She started smoking about 46 years ago. She has a 23.2 pack-year smoking history. She has never used smokeless tobacco. She reports that she does not drink alcohol and does not use drugs.  Allergies  Allergen Reactions   Advil  [Ibuprofen ] Nausea And Vomiting   Bee Venom Swelling   Codeine  Nausea Only and Other (See Comments)    CAUSES ULCERS   Clonidine Derivatives Other (See Comments)    Unknown    Doxycycline  Nausea Only   Statins Other (See Comments)    unknown   Latex Rash   Levaquin  [Levofloxacin ] Nausea Only   Propoxyphene N-Acetaminophen  Nausea Only    Family  History  Problem Relation Age of Onset   Emphysema Father    Dementia Mother    Diabetes Maternal Grandmother    Diabetes Maternal Aunt    Diabetes Other        mat. cousin   Heart disease Brother    Colon cancer Neg Hx    Allergic rhinitis Neg Hx    Angioedema Neg Hx    Asthma Neg Hx    Atopy Neg Hx    Eczema Neg Hx    Immunodeficiency Neg Hx    Urticaria Neg Hx      Prior to Admission medications   Medication Sig Start Date End Date Taking? Authorizing Provider  meclizine  (ANTIVERT ) 12.5 MG tablet TAKE 1 TABLET BY MOUTH THREE TIMES DAILY AS NEEDED FOR DIZZINESS 01/13/24  Roslyn Coombe, MD  albuterol  (PROAIR  HFA) 108 (90 Base) MCG/ACT inhaler Inhale 2 puffs into the lungs every 6 (six) hours as needed for wheezing or shortness of breath. 12/27/22   Roslyn Coombe, MD  ALPRAZolam  (XANAX ) 1 MG tablet TAKE 1 TABLET BY MOUTH AT BEDTIME AS NEEDED FOR ANXIETY 11/19/23   Roslyn Coombe, MD  Budeson-Glycopyrrol-Formoterol  (BREZTRI  AEROSPHERE) 160-9-4.8 MCG/ACT AERO Inhale 2 puffs once a day with spacer to help prevent cough and wheeze.  Rinse mouth out afterwards.  During increased symptoms/sickness increase Breztri  to 2 puffs twice a day with spacer until symptoms return to baseline 11/04/23   Tinnie Forehand, FNP  cetirizine  (ZYRTEC  ALLERGY) 10 MG tablet take1 tablet by mouth once a day as needed for runny nose/drainage down throat 11/04/23   Tinnie Forehand, FNP  dexlansoprazole  (DEXILANT ) 60 MG capsule Take 1 capsule (60 mg total) by mouth daily. 06/06/23   Roslyn Coombe, MD  EPINEPHrine  0.3 mg/0.3 mL IJ SOAJ injection Inject 0.3 mg into the muscle as needed for anaphylaxis. Do not refrigerate. 08/04/23   Rochester Chuck, MD  Erenumab-aooe 140 MG/ML SOAJ Inject 140 mg into the skin every 30 (thirty) days.    [provider]  FASENRA  30 MG/ML prefilled syringe INJECT 30MG  SUBCUTANEOUSLY EVERY 8 WEEKS 09/21/23   Rochester Chuck, MD  FLUoxetine  (PROZAC ) 20 MG capsule Take 1 capsule  (20 mg total) by mouth daily. 06/30/23 06/29/24  Roslyn Coombe, MD  gabapentin  (NEURONTIN ) 600 MG tablet Take 600 mg by mouth 2 (two) times daily. 04/30/22   [provider]  levalbuterol  (XOPENEX  HFA) 45 MCG/ACT inhaler Inhale 2 puffs every 4-6 hours as needed for cough, wheeze, tightness in chest, or shortness of breath 11/04/23   Tinnie Forehand, FNP  montelukast  (SINGULAIR ) 10 MG tablet Take 1 tablet (10 mg total) by mouth at bedtime. 11/04/23   Tinnie Forehand, FNP  Naloxone HCl 4 MG/0.25ML LIQD Place 1 spray into the nose as needed.    [provider]  nitroGLYCERIN  (NITROSTAT ) 0.4 MG SL tablet Place 1 tablet (0.4 mg total) under the tongue every 5 (five) minutes as needed for chest pain. 06/06/23   Roslyn Coombe, MD  nystatin  cream (MYCOSTATIN ) Apply 1 Application topically 2 (two) times daily. 09/19/23   Wellington Half, FNP  promethazine  (PHENERGAN ) 12.5 MG tablet Take 1 tablet (12.5 mg total) by mouth every 8 (eight) hours as needed for nausea or vomiting. 06/30/23   Roslyn Coombe, MD  tiZANidine  (ZANAFLEX ) 2 MG tablet Take 1 tablet (2 mg total) by mouth every 8 (eight) hours as needed for muscle spasms. 12/02/23   Roslyn Coombe, MD  traMADol  (ULTRAM ) 50 MG tablet Take 1 tablet (50 mg total) by mouth every 6 (six) hours as needed. 12/29/23   Roslyn Coombe, MD  valACYclovir  (VALTREX ) 500 MG tablet Take 1 tablet (500 mg total) by mouth 2 (two) times daily. 09/19/23   Wellington Half, FNP  zolpidem  (AMBIEN ) 5 MG tablet Take 1 tablet (5 mg total) by mouth at bedtime as needed for sleep. 12/10/23   Roslyn Coombe, MD    Physical Exam: BP (!) 111/59   Pulse 99   Temp 98.1 F (36.7 C)   Resp 18   Wt 52.2 kg   SpO2 95%   BMI 19.74 kg/m  General: Elderly woman laying in bed slightly restless. No acute distress. HEENT: [place]/AT. Anicteric sclera. Dry mucous membrane CV: RRR. No murmurs, rubs, or  gallops. No LE edema Pulmonary: Lungs CTAB. Normal effort. Mild expiratory  wheezing in the upper lung fields. Abdominal: Soft, nontender, nondistended. Normal bowel sounds. Extremities: Palpable radial and DP pulses. Normal ROM. Skin: Warm and dry. No obvious rash or lesions. Dry mucous membrane. Neuro: A&Ox3. Moves all extremities. Occasional tremors in the upper extremities. Normal sensation to light touch. No focal deficit. Psych: Anxious          Labs on Admission:  Basic Metabolic Panel: Recent Labs  Lab 01/15/24 1106  NA 137  K 4.5  CL 104  CO2 23  GLUCOSE 100*  BUN 14  CREATININE 1.04*  CALCIUM  9.5   Liver Function Tests: Recent Labs  Lab 01/15/24 1106  AST 16  ALT 9  ALKPHOS 74  BILITOT 0.6  PROT 7.2  ALBUMIN 4.4   No results for input(s): "LIPASE", "AMYLASE" in the last 168 hours. No results for input(s): "AMMONIA" in the last 168 hours. CBC: Recent Labs  Lab 01/15/24 1106  WBC 7.8  HGB 13.6  HCT 41.0  MCV 93.6  PLT 293   Cardiac Enzymes: No results for input(s): "CKTOTAL", "CKMB", "CKMBINDEX", "TROPONINI" in the last 168 hours. BNP (last 3 results) No results for input(s): "BNP" in the last 8760 hours.  ProBNP (last 3 results) No results for input(s): "PROBNP" in the last 8760 hours.  CBG: No results for input(s): "GLUCAP" in the last 168 hours.  Radiological Exams on Admission: CT Angio Chest Pulmonary Embolism (PE) W or WO Contrast Result Date: 01/15/2024 CLINICAL DATA:  Pulmonary embolism (PE) suspected, high prob, tremors, central chest pain EXAM: CT ANGIOGRAPHY CHEST WITH CONTRAST TECHNIQUE: Multidetector CT imaging of the chest was performed using the standard protocol during bolus administration of intravenous contrast. Multiplanar CT image reconstructions and MIPs were obtained to evaluate the vascular anatomy. RADIATION DOSE REDUCTION: This exam was performed according to the departmental dose-optimization program which includes automated exposure control, adjustment of the mA and/or kV according to patient size  and/or use of iterative reconstruction technique. CONTRAST:  75mL OMNIPAQUE  IOHEXOL  350 MG/ML SOLN COMPARISON:  May 14, 2023, February 16, 2017 FINDINGS: Pulmonary Embolism: No pulmonary embolism. Cardiovascular: No cardiomegaly or pericardial effusion. No aortic aneurysm. Scattered aortic and coronary artery atherosclerosis. Mediastinum/Nodes: No mediastinal mass. No mediastinal, hilar, or axillary lymphadenopathy. Lungs/Pleura: The trachea is midline and patent. Biapical pleuroparenchymal scarring with bullous change along the medial right lung apex. Mild diffuse bronchial wall thickening with areas of mucous plugging in both lower lobes. Moderate centrilobular emphysema. No focal airspace consolidation, pleural effusion, or pneumothorax. Unchanged 3 mm subpleural nodule in the left upper lobe (axial 21). Musculoskeletal: No acute fracture or destructive bone lesion. Chronic, healed left anterior rib fractures. Osteopenia. Multilevel degenerative disc disease of the spine. Upper Abdomen: No acute abnormality in the partially visualized upper abdomen. Cholecystectomy. Review of the MIP images confirms the above findings. IMPRESSION: 1. No pulmonary embolism. 2. Moderate centrilobular emphysema. Diffuse bronchial wall thickening with areas of mucous plugging in both lower lobes, may reflect changes of acute or chronic bronchitis. No superimposed pneumonia, pleural effusion, or pulmonary edema. Aortic Atherosclerosis (ICD10-I70.0) and Emphysema (ICD10-J43.9). Electronically Signed   By: Rance Burrows M.D.   On: 01/15/2024 15:02   DG Chest Port 1 View Result Date: 01/15/2024 CLINICAL DATA:  Shortness of breath and chest pain. EXAM: PORTABLE CHEST 1 VIEW COMPARISON:  09/30/2023 FINDINGS: Lungs appear hyperexpanded. The lungs are clear without focal pneumonia, edema, pneumothorax or pleural effusion. The cardiopericardial silhouette is within  normal limits for size. Nonacute left rib fractures evident. Telemetry leads  overlie the chest. IMPRESSION: Hyperexpansion without acute cardiopulmonary findings. Electronically Signed   By: Donnal Fusi M.D.   On: 01/15/2024 12:30   Assessment/Plan Lori [name] is a 68 y.o. female with medical history significant for anxiety and depression, COPD, asthma, GERD, HLD, HSV infection, asthma, allergic rhinitis on immunotherapy, chronic leg pain and osteoporosis who presented to the ED for evaluation of shortness of breath, fatigue and shaking and admitted for COPD exacerbation.  # COPD with acute exacerbation # Asthma exacerbation - Patient with documented both asthma and COPD presented with few days of SOB, increased cough and wheezing - Patient with on exam but stable from respiratory standpoint, remains on room air. - COPD exacerbation in the setting of patient's continuous use of tobacco - Prednisone  40 mg daily for 4 days - IVNS at 100 cc/h for 10 hours - Continue home Breztri  - PRN DuoNebs for cough and wheezing - PRN guaifenesin  for cough and loosen phlegm - Follow-up blood and sputum cultures - Incentive spirometer, flutter valve  # Anxiety and depression - Patient reports she has not taken her Xanax  over the last few days - Patient slightly restless with intermittent tremors on exam - Resume home Xanax  PRN for anxiety - Continue fluoxetine   # Chronic leg pain - Continue gabapentin  and PRN tramadol   # GERD - PPI  # Allergic rhinitis - Followed by allergist, on immunotherapy - Continue Singulair  and as needed Claritin   # Osteoporosis - Recently started on Prolia  injections - Check vitamin D  levels  # Hx of HSV infection - Continue Valtrex   # Insomnia - Continue zolpidem  as needed for sleep  # Tobacco use disorder - Reports she is still smoking 1/2 ppd of cigarettes - Patient counseled on importance of smoking cessation - Reports she has attempted to quit in the past and relapsed  Smoking cessation counseling for 4 minutes today,  nicotine  patch 14 mg ordered  I have discussed tobacco cessation with the patient.  I have counseled the patient regarding the negative impacts of continued tobacco use including but not limited to lung cancer, COPD, and cardiovascular disease.  I have discussed alternatives to tobacco and modalities that may help facilitate tobacco cessation including but not limited to biofeedback, hypnosis, and medications.  Total time spent with tobacco counseling was 4 minutes.   DVT prophylaxis: Lovenox      Code Status: Limited: Do not attempt resuscitation (DNR) -DNR-LIMITED -Do Not Intubate/DNI   Consults called: None  Family Communication: No family at bedside  Severity of Illness: The appropriate patient status for this patient is OBSERVATION. Observation status is judged to be reasonable and necessary in order to provide the required intensity of service to ensure the patient's safety. The patient's presenting symptoms, physical exam findings, and initial radiographic and laboratory data in the context of their medical condition is felt to place them at decreased risk for further clinical deterioration. Furthermore, it is anticipated that the patient will be medically stable for discharge from the hospital within 2 midnights of admission.   Level of care: Telemetry   This record has been created using Conservation officer, historic buildings. Errors have been sought and corrected, but may not always be located. Such creation errors do not reflect on the standard of care.   Vita Grip, MD 01/15/2024, 8:22 PM [facility] Pager: [phone] Isaiah 41:10   If 7PM-7AM, please contact night-coverage [email or web address] Password [email or web address]

## 2024-01-16 ENCOUNTER — Encounter (HOSPITAL_COMMUNITY): Payer: Self-pay | Admitting: Student

## 2024-01-16 DIAGNOSIS — J441 Chronic obstructive pulmonary disease with (acute) exacerbation: Secondary | ICD-10-CM | POA: Diagnosis not present

## 2024-01-16 LAB — CBC
HCT: 35.7 % — ABNORMAL LOW (ref 36.0–46.0)
Hemoglobin: 11.5 g/dL — ABNORMAL LOW (ref 12.0–15.0)
MCH: 31.3 pg (ref 26.0–34.0)
MCHC: 32.2 g/dL (ref 30.0–36.0)
MCV: 97 fL (ref 80.0–100.0)
Platelets: 248 10*3/uL (ref 150–400)
RBC: 3.68 MIL/uL — ABNORMAL LOW (ref 3.87–5.11)
RDW: 13.4 % (ref 11.5–15.5)
WBC: 14.7 10*3/uL — ABNORMAL HIGH (ref 4.0–10.5)
nRBC: 0 % (ref 0.0–0.2)

## 2024-01-16 LAB — BASIC METABOLIC PANEL WITH GFR
Anion gap: 6 (ref 5–15)
BUN: 18 mg/dL (ref 8–23)
CO2: 19 mmol/L — ABNORMAL LOW (ref 22–32)
Calcium: 8.6 mg/dL — ABNORMAL LOW (ref 8.9–10.3)
Chloride: 111 mmol/L (ref 98–111)
Creatinine, Ser: 0.79 mg/dL (ref 0.44–1.00)
GFR, Estimated: >60 mL/min (ref >60)
Glucose, Bld: 121 mg/dL — ABNORMAL HIGH (ref 70–99)
Potassium: 4.7 mmol/L (ref 3.5–5.1)
Sodium: 136 mmol/L (ref 135–145)

## 2024-01-16 LAB — VITAMIN D 25 HYDROXY (VIT D DEFICIENCY, FRACTURES): Vit D, 25-Hydroxy: 51.02 ng/mL (ref 30–100)

## 2024-01-16 LAB — HIV ANTIBODY (ROUTINE TESTING W REFLEX): HIV Screen 4th Generation wRfx: NONREACTIVE

## 2024-01-16 MED ORDER — FLUTICASONE PROPIONATE 50 MCG/ACT NA SUSP
2.0000 | Freq: Every day | NASAL | Status: DC
  Administered 2024-01-17 – 2024-01-19 (×3): 2 via NASAL
  Filled 2024-01-16: qty 16

## 2024-01-16 MED ORDER — IPRATROPIUM-ALBUTEROL 0.5-2.5 (3) MG/3ML IN SOLN
3.0000 mL | Freq: Four times a day (QID) | RESPIRATORY_TRACT | Status: DC
  Administered 2024-01-16 – 2024-01-19 (×12): 3 mL via RESPIRATORY_TRACT
  Filled 2024-01-16 (×12): qty 3

## 2024-01-16 MED ORDER — AZITHROMYCIN 250 MG PO TABS
500.0000 mg | ORAL_TABLET | Freq: Every day | ORAL | Status: DC
  Administered 2024-01-16 – 2024-01-19 (×4): 500 mg via ORAL
  Filled 2024-01-16 (×4): qty 2

## 2024-01-16 MED ORDER — BUDESONIDE 0.25 MG/2ML IN SUSP
0.2500 mg | Freq: Two times a day (BID) | RESPIRATORY_TRACT | Status: DC
  Administered 2024-01-16 – 2024-01-19 (×6): 0.25 mg via RESPIRATORY_TRACT
  Filled 2024-01-16 (×6): qty 2

## 2024-01-16 MED ORDER — METHYLPREDNISOLONE SODIUM SUCC 40 MG IJ SOLR
40.0000 mg | Freq: Two times a day (BID) | INTRAMUSCULAR | Status: DC
  Administered 2024-01-16 – 2024-01-18 (×4): 40 mg via INTRAVENOUS
  Filled 2024-01-16 (×4): qty 1

## 2024-01-16 NOTE — Progress Notes (Signed)
 PROGRESS NOTE    Lori [name]  YQM:[id number] DOB: 1956-09-01 DOA: 01/15/2024 PCP: Roslyn Coombe, MD  Outpatient Specialists:     Brief Narrative:  Patient is a 68 year old female past medical history significant for COPD/ILD, GERD, HLD, HSV infection, allergic rhinitis on immunotherapy and osteoporosis.  Patient was admitted with COPD exacerbation.   Assessment & Plan:   Principal Problem:   COPD exacerbation (HCC) Active Problems:   Asthma, chronic obstructive, with acute exacerbation (HCC)   # COPD with acute exacerbation # Asthma exacerbation - Change oral prednisone  to IV Solu-Medrol . - Start DuoNeb every 6 hourly, as well as, every 6 hourly as needed. -Start nebs Pulmicort  0.25 Mg twice daily - Start azithromycin . - Further management will depend on above.  -Supportive care. - Counseled to quit cigarettes.  # Anxiety and depression - Continue Prozac . - Cautious use of Xanax .  # Chronic leg pain - Continue gabapentin  and PRN tramadol    # GERD - PPI   # Allergic rhinitis - Followed by allergist, on immunotherapy - Continue Singulair  and as needed Claritin  - Flonase  2 sprays to each nostril daily   # Osteoporosis - Recently started on Prolia  injections   # Hx of HSV infection - Continue Valtrex    # Insomnia - Continue zolpidem  as needed for sleep   # Tobacco use disorder - Counseled to quit.  DVT prophylaxis: Subcutaneous Lovenox  Code Status: DO NOT RESUSCITATE Family Communication:  Disposition Plan: Observation   Consultants:  None  Procedures:  None  Antimicrobials:  Start azithromycin    Subjective: Patient continues to wheeze. Shortness of breath  Objective: Vitals:   01/16/24 0510 01/16/24 0800 01/16/24 0803 01/16/24 0940  BP: 122/62   (!) 115/52  Pulse: 77   86  Resp: 18   18  Temp: 97.7 F (36.5 C)   98.1 F (36.7 C)  TempSrc:      SpO2: 96% 98% 98% 95%  Weight:        Intake/Output Summary (Last 24 hours) at  01/16/2024 1609 Last data filed at 01/16/2024 0316 Gross per 24 hour  Intake 524.94 ml  Output --  Net 524.94 ml   Filed Weights   01/15/24 1034  Weight: 52.2 kg    Examination:  General exam: Appears calm and comfortable  Respiratory system: Decreased air entry with wheezing. Cardiovascular system: S1 & S2 heard. Gastrointestinal system: Abdomen is soft and nontender.   Central nervous system: Awake and alert.    Data Reviewed: I have personally reviewed following labs and imaging studies  CBC: Recent Labs  Lab 01/15/24 1106 01/16/24 0544  WBC 7.8 14.7*  HGB 13.6 11.5*  HCT 41.0 35.7*  MCV 93.6 97.0  PLT 293 248   Basic Metabolic Panel: Recent Labs  Lab 01/15/24 1106 01/16/24 0544  NA 137 136  K 4.5 4.7  CL 104 111  CO2 23 19*  GLUCOSE 100* 121*  BUN 14 18  CREATININE 1.04* 0.79  CALCIUM  9.5 8.6*   GFR: Estimated Creatinine Clearance: 56.2 mL/min (by C-G formula based on SCr of 0.79 mg/dL). Liver Function Tests: Recent Labs  Lab 01/15/24 1106  AST 16  ALT 9  ALKPHOS 74  BILITOT 0.6  PROT 7.2  ALBUMIN 4.4   No results for input(s): "LIPASE", "AMYLASE" in the last 168 hours. No results for input(s): "AMMONIA" in the last 168 hours. Coagulation Profile: No results for input(s): "INR", "PROTIME" in the last 168 hours. Cardiac Enzymes: No results for input(s): "CKTOTAL", "CKMB", "CKMBINDEX", "  TROPONINI" in the last 168 hours. BNP (last 3 results) No results for input(s): "PROBNP" in the last 8760 hours. HbA1C: No results for input(s): "HGBA1C" in the last 72 hours. CBG: No results for input(s): "GLUCAP" in the last 168 hours. Lipid Profile: No results for input(s): "CHOL", "HDL", "LDLCALC", "TRIG", "CHOLHDL", "LDLDIRECT" in the last 72 hours. Thyroid  Function Tests: No results for input(s): "TSH", "T4TOTAL", "FREET4", "T3FREE", "THYROIDAB" in the last 72 hours. Anemia Panel: No results for input(s): "VITAMINB12", "FOLATE", "FERRITIN", "TIBC",  "IRON ", "RETICCTPCT" in the last 72 hours. Urine analysis:    Component Value Date/Time   COLORURINE YELLOW 12/31/2023 1453   APPEARANCEUR CLEAR 12/31/2023 1453   LABSPEC 1.008 12/31/2023 1453   PHURINE 5.0 12/31/2023 1453   GLUCOSEU NEGATIVE 12/31/2023 1453   GLUCOSEU NEGATIVE 12/29/2023 0955   HGBUR NEGATIVE 12/31/2023 1453   BILIRUBINUR NEGATIVE 12/31/2023 1453   BILIRUBINUR negative 09/19/2023 1104   KETONESUR NEGATIVE 12/31/2023 1453   PROTEINUR NEGATIVE 12/31/2023 1453   UROBILINOGEN 0.2 12/29/2023 0955   NITRITE NEGATIVE 12/31/2023 1453   LEUKOCYTESUR NEGATIVE 12/31/2023 1453   Sepsis Labs: @LABRCNTIP (procalcitonin:4,lacticidven:4)  ) Recent Results (from the past 240 hours)  Blood culture (routine x 2)     Status: None (Preliminary result)   Collection Time: 01/15/24 11:06 AM   Specimen: BLOOD LEFT WRIST  Result Value Ref Range Status   Specimen Description   Final    BLOOD LEFT WRIST Performed at [facility], [address], [place], [place] [address]    Special Requests   Final    BOTTLES DRAWN AEROBIC AND ANAEROBIC Blood Culture results may not be optimal due to an inadequate volume of blood received in culture bottles Performed at [facility], [address], [place], [place] [address]    Culture   Final    NO GROWTH < 24 HOURS Performed at [facility], [address] [place], [place] [address]    Report Status PENDING  Incomplete  Blood culture (routine x 2)     Status: None (Preliminary result)   Collection Time: 01/15/24 11:20 AM   Specimen: BLOOD  Result Value Ref Range Status   Specimen Description   Final    BLOOD RIGHT ANTECUBITAL Performed at [facility], [address], [place], [place] [address]    Special Requests   Final    BOTTLES DRAWN AEROBIC AND ANAEROBIC Blood Culture results may not be optimal due to an inadequate volume of blood received in culture bottles Performed at [facility], [address] [place], [place] [address]    Culture   Final    NO GROWTH < 24 HOURS Performed at [facility], [address], [place], [place] [address]    Report Status PENDING  Incomplete         Radiology Studies: CT Angio Chest Pulmonary Embolism (PE) W or WO Contrast Result Date: 01/15/2024 CLINICAL DATA:  Pulmonary embolism (PE) suspected, high prob, tremors, central chest pain EXAM: CT ANGIOGRAPHY CHEST WITH CONTRAST TECHNIQUE: Multidetector CT imaging of the chest was performed using the standard protocol during bolus administration of intravenous contrast. Multiplanar CT image reconstructions and MIPs were obtained to evaluate the vascular anatomy. RADIATION DOSE REDUCTION: This exam was performed according to the departmental dose-optimization program which includes automated exposure control, adjustment of the mA and/or kV according to patient size and/or use of iterative reconstruction technique. CONTRAST:  75mL OMNIPAQUE  IOHEXOL  350 MG/ML SOLN COMPARISON:  May 14, 2023, February 16, 2017 FINDINGS: Pulmonary Embolism: No pulmonary embolism. Cardiovascular: No cardiomegaly or pericardial effusion. No aortic aneurysm. Scattered aortic and coronary artery atherosclerosis. Mediastinum/Nodes: No mediastinal mass. No mediastinal, hilar, or axillary lymphadenopathy. Lungs/Pleura: The trachea is midline and patent. Biapical pleuroparenchymal scarring with bullous change along the medial right lung apex. Mild diffuse bronchial wall thickening with areas of mucous plugging in both lower lobes. Moderate centrilobular emphysema. No focal airspace consolidation, pleural effusion, or pneumothorax. Unchanged 3 mm subpleural nodule in the left upper lobe (axial 21). Musculoskeletal: No acute fracture or destructive bone lesion. Chronic, healed left anterior rib fractures. Osteopenia. Multilevel degenerative disc disease of the spine. Upper Abdomen: No acute abnormality in the  partially visualized upper abdomen. Cholecystectomy. Review of the MIP images confirms the above findings. IMPRESSION: 1. No pulmonary embolism. 2. Moderate centrilobular emphysema. Diffuse bronchial wall thickening with areas of mucous plugging in both lower lobes, may reflect changes of acute or chronic bronchitis. No superimposed pneumonia, pleural effusion, or pulmonary edema. Aortic Atherosclerosis (ICD10-I70.0) and Emphysema (ICD10-J43.9). Electronically Signed   By: Rance Burrows M.D.   On: 01/15/2024 15:02   DG Chest Port 1 View Result Date: 01/15/2024 CLINICAL DATA:  Shortness of breath and chest pain. EXAM: PORTABLE CHEST 1 VIEW COMPARISON:  09/30/2023 FINDINGS: Lungs appear hyperexpanded. The lungs are clear without focal pneumonia, edema, pneumothorax or pleural effusion. The cardiopericardial silhouette is within normal limits for size. Nonacute left rib fractures evident. Telemetry leads overlie the chest. IMPRESSION: Hyperexpansion without acute cardiopulmonary findings. Electronically Signed   By: Donnal Fusi M.D.   On: 01/15/2024 12:30        Scheduled Meds:  budeson-glycopyrrolate -formoterol   2 puff Inhalation Daily   enoxaparin  (LOVENOX ) injection  40 mg Subcutaneous Q24H   FLUoxetine   20 mg Oral Daily   gabapentin   600 mg Oral Q1500   montelukast   10 mg Oral QHS   nicotine   14 mg Transdermal Daily   pantoprazole   40 mg Oral Daily   predniSONE   40 mg Oral Q breakfast   valACYclovir   500 mg Oral BID   Continuous Infusions:  albuterol  10 mg/hr (01/15/24 1611)     LOS: 0 days    Time spent: 35 minutes    Fonnie Iba, MD  [facility] Pager #: [phone] 7PM-7AM contact night coverage as above

## 2024-01-16 NOTE — Care Management Obs Status (Signed)
 MEDICARE OBSERVATION STATUS NOTIFICATION   Patient Details  Name: JAZYA CAHALL MRN: [id number] Date of Birth: Apr 05, 1956   Medicare Observation Status Notification Given:  Yes    Tessie Fila, RN 01/16/2024, 1:22 PM

## 2024-01-16 NOTE — Progress Notes (Signed)
 Messaged MD, patient needs PT and friend at bedside requesting update

## 2024-01-16 NOTE — Progress Notes (Signed)
   01/16/24 1406  TOC Brief Assessment  Insurance and Status Reviewed  Patient has primary care physician Yes  Home environment has been reviewed Single family home  Prior level of function: Independent  Prior/Current Home Services No current home services  Readmission risk has been reviewed Yes (N/A)  Transition of care needs transition of care needs identified, TOC will continue to follow   MOON given. Pt has no new recommendations at this time. TOC will continue to follow for any new recommendations.

## 2024-01-16 NOTE — Progress Notes (Signed)
 Mobility Specialist - Progress Note   01/16/24 1407  Mobility  Activity Ambulated with assistance to bathroom;Ambulated with assistance in hallway  Level of Assistance Contact guard assist, steadying assist  Assistive Device Four wheel walker  Distance Ambulated (ft) 100 ft  Range of Motion/Exercises Active  Activity Response Tolerated well  Mobility Referral Yes  Mobility visit 1 Mobility  Mobility Specialist Start Time (ACUTE ONLY) 1355  Mobility Specialist Stop Time (ACUTE ONLY) 1407  Mobility Specialist Time Calculation (min) (ACUTE ONLY) 12 min   Pt was found in bed and agreeable to ambulate after bathroom use. Stated feeling weak and grew very fatigued with session. At EOS returned to bed with all needs met. Call bell in reach and bed alarm on.  Lorna Rose Mobility Specialist

## 2024-01-16 NOTE — Plan of Care (Signed)

## 2024-01-16 NOTE — Progress Notes (Signed)
 Pt up to bathroom with assist x1.  Pt is very unsteady and wobbly.  Tremors continue.  Pt back in bed, bed alarm on, call bell within reach.  Pt verbalized understanding to use call bell for any needs including bathroom.  Friend at bedside

## 2024-01-16 NOTE — Telephone Encounter (Signed)
 Pt was admitted into Hospital yesterday 01/15/24.

## 2024-01-17 DIAGNOSIS — J441 Chronic obstructive pulmonary disease with (acute) exacerbation: Secondary | ICD-10-CM | POA: Diagnosis present

## 2024-01-17 DIAGNOSIS — R0600 Dyspnea, unspecified: Secondary | ICD-10-CM | POA: Diagnosis present

## 2024-01-17 DIAGNOSIS — E785 Hyperlipidemia, unspecified: Secondary | ICD-10-CM | POA: Diagnosis present

## 2024-01-17 DIAGNOSIS — J432 Centrilobular emphysema: Secondary | ICD-10-CM | POA: Diagnosis present

## 2024-01-17 DIAGNOSIS — F17210 Nicotine dependence, cigarettes, uncomplicated: Secondary | ICD-10-CM | POA: Diagnosis present

## 2024-01-17 DIAGNOSIS — Z716 Tobacco abuse counseling: Secondary | ICD-10-CM | POA: Diagnosis not present

## 2024-01-17 DIAGNOSIS — B009 Herpesviral infection, unspecified: Secondary | ICD-10-CM | POA: Diagnosis present

## 2024-01-17 DIAGNOSIS — Z8249 Family history of ischemic heart disease and other diseases of the circulatory system: Secondary | ICD-10-CM | POA: Diagnosis not present

## 2024-01-17 DIAGNOSIS — F32A Depression, unspecified: Secondary | ICD-10-CM | POA: Diagnosis present

## 2024-01-17 DIAGNOSIS — M79606 Pain in leg, unspecified: Secondary | ICD-10-CM | POA: Diagnosis present

## 2024-01-17 DIAGNOSIS — G4700 Insomnia, unspecified: Secondary | ICD-10-CM | POA: Diagnosis present

## 2024-01-17 DIAGNOSIS — Z66 Do not resuscitate: Secondary | ICD-10-CM | POA: Diagnosis present

## 2024-01-17 DIAGNOSIS — D72828 Other elevated white blood cell count: Secondary | ICD-10-CM | POA: Diagnosis present

## 2024-01-17 DIAGNOSIS — J849 Interstitial pulmonary disease, unspecified: Secondary | ICD-10-CM | POA: Diagnosis present

## 2024-01-17 DIAGNOSIS — R251 Tremor, unspecified: Secondary | ICD-10-CM | POA: Diagnosis present

## 2024-01-17 DIAGNOSIS — F419 Anxiety disorder, unspecified: Secondary | ICD-10-CM | POA: Diagnosis present

## 2024-01-17 DIAGNOSIS — Z7951 Long term (current) use of inhaled steroids: Secondary | ICD-10-CM | POA: Diagnosis not present

## 2024-01-17 DIAGNOSIS — G8929 Other chronic pain: Secondary | ICD-10-CM | POA: Diagnosis present

## 2024-01-17 DIAGNOSIS — M810 Age-related osteoporosis without current pathological fracture: Secondary | ICD-10-CM | POA: Diagnosis present

## 2024-01-17 DIAGNOSIS — M545 Low back pain, unspecified: Secondary | ICD-10-CM | POA: Diagnosis not present

## 2024-01-17 DIAGNOSIS — Z79899 Other long term (current) drug therapy: Secondary | ICD-10-CM | POA: Diagnosis not present

## 2024-01-17 DIAGNOSIS — Z885 Allergy status to narcotic agent status: Secondary | ICD-10-CM | POA: Diagnosis not present

## 2024-01-17 DIAGNOSIS — Z888 Allergy status to other drugs, medicaments and biological substances status: Secondary | ICD-10-CM | POA: Diagnosis not present

## 2024-01-17 DIAGNOSIS — J45901 Unspecified asthma with (acute) exacerbation: Secondary | ICD-10-CM | POA: Diagnosis present

## 2024-01-17 DIAGNOSIS — K219 Gastro-esophageal reflux disease without esophagitis: Secondary | ICD-10-CM | POA: Diagnosis present

## 2024-01-17 DIAGNOSIS — Z886 Allergy status to analgesic agent status: Secondary | ICD-10-CM | POA: Diagnosis not present

## 2024-01-17 MED ORDER — MORPHINE SULFATE (PF) 2 MG/ML IV SOLN
2.0000 mg | INTRAVENOUS | Status: DC | PRN
  Administered 2024-01-17: 2 mg via INTRAVENOUS
  Filled 2024-01-17: qty 1

## 2024-01-17 MED ORDER — DM-GUAIFENESIN ER 30-600 MG PO TB12
1.0000 | ORAL_TABLET | Freq: Two times a day (BID) | ORAL | Status: DC
  Administered 2024-01-17 – 2024-01-19 (×5): 1 via ORAL
  Filled 2024-01-17 (×5): qty 1

## 2024-01-17 MED ORDER — ALPRAZOLAM 0.5 MG PO TABS
1.0000 mg | ORAL_TABLET | Freq: Three times a day (TID) | ORAL | Status: DC | PRN
  Administered 2024-01-17 – 2024-01-19 (×5): 1 mg via ORAL
  Filled 2024-01-17 (×6): qty 2

## 2024-01-17 MED ORDER — BENZONATATE 100 MG PO CAPS
100.0000 mg | ORAL_CAPSULE | Freq: Three times a day (TID) | ORAL | Status: DC
  Administered 2024-01-17 – 2024-01-19 (×7): 100 mg via ORAL
  Filled 2024-01-17 (×7): qty 1

## 2024-01-17 NOTE — Plan of Care (Signed)
  Problem: Education: Goal: Knowledge of General Education information will improve Description: Including pain rating scale, medication(s)/side effects and non-pharmacologic comfort measures Outcome: Progressing   Problem: Clinical Measurements: Goal: Ability to maintain clinical measurements within normal limits will improve Outcome: Not Progressing Goal: Diagnostic test results will improve Outcome: Not Progressing   Problem: Activity: Goal: Risk for activity intolerance will decrease Outcome: Not Progressing   Problem: Pain Managment: Goal: General experience of comfort will improve and/or be controlled Outcome: Progressing   Problem: Activity: Goal: Ability to tolerate increased activity will improve Outcome: Not Progressing

## 2024-01-17 NOTE — Progress Notes (Signed)
 Mobility Specialist - Progress Note  (RA) During mobility: 98 bpm HR, 93% SpO2   01/17/24 1341  Mobility  Activity Ambulated with assistance in hallway  Level of Assistance Contact guard assist, steadying assist  Assistive Device Front wheel walker  Distance Ambulated (ft) 100 ft  Range of Motion/Exercises Active  Activity Response Tolerated fair  Mobility Referral Yes  Mobility visit 1 Mobility  Mobility Specialist Start Time (ACUTE ONLY) 1320  Mobility Specialist Stop Time (ACUTE ONLY) 1335  Mobility Specialist Time Calculation (min) (ACUTE ONLY) 15 min   Pt was found in bathroom and agreeable to ambulate. Went to sit EOB for seated rest break prior to hallway ambulation. During ambulation got fatigued at ~111ft and was wheeled back to room in chair. At EOS was left in bed with all needs met. Call bell in reach.  Lorna Rose Mobility Specialist

## 2024-01-17 NOTE — Evaluation (Signed)
 Occupational Therapy Evaluation Patient Details Name: Lori [name] MRN: [id number] DOB: 1956-02-09 Today's Date: 01/17/2024   History of Present Illness   Patient is a 68 yo female admitted to [facility] with COPD exacerbation.  PMH: CAD, COPD, GERD, anxiety, depression, hiatal hernia, HLD, neck surgery, osteoporosis.     Clinical Impressions PTA, patient lives alone and was independent with ambulation and B/IADL's needing assistance from friend who assists PRN from outside the home for groceries and transportation. Currently, patient presents with deficits outlined below (see OT Problem List for details) most significantly pain, generalized weakness, decreased balance and activity tolerance. OT recommending HHOT with as much initial caregiver support as possible and a rollator for energy conservation and falls prevention as patient reports recent falls history. OT will continue to follow acutely to progress safety and functional performance level.      If plan is discharge home, recommend the following:   A little help with walking and/or transfers;A little help with bathing/dressing/bathroom;Assistance with cooking/housework;Direct supervision/assist for medications management;Direct supervision/assist for financial management;Assist for transportation;Help with stairs or ramp for entrance     Functional Status Assessment   Patient has had a recent decline in their functional status and demonstrates the ability to make significant improvements in function in a reasonable and predictable amount of time.     Equipment Recommendations    (rollator)      Precautions/Restrictions   Precautions Precautions: None Precaution/Restrictions Comments: watch O2 sats Restrictions Weight Bearing Restrictions Per Provider Order: No     Mobility Bed Mobility Overal bed mobility: Modified Independent             General bed mobility comments: rials and HOB elevated     Transfers Overall transfer level: Needs assistance Equipment used: Rolling walker (2 wheels) Transfers: Sit to/from Stand, Bed to chair/wheelchair/BSC Sit to Stand: Contact guard assist     Step pivot transfers: Contact guard assist     General transfer comment: min cues for RW integration and proximity      Balance Overall balance assessment: Mild deficits observed, not formally tested                                         ADL either performed or assessed with clinical judgement   ADL Overall ADL's : Needs assistance/impaired Eating/Feeding: Independent;Sitting   Grooming: Wash/dry hands;Wash/dry face;Oral care;Sitting;Modified independent   Upper Body Bathing: Modified independent;Sitting   Lower Body Bathing: Contact guard assist;Sitting/lateral leans;Sit to/from stand   Upper Body Dressing : Supervision/safety;Sitting   Lower Body Dressing: Contact guard assist;Sitting/lateral leans;Sit to/from stand   Toilet Transfer: Rolling walker (2 wheels);Contact guard assist   Toileting- Clothing Manipulation and Hygiene: Set up;Sitting/lateral lean;Sit to/from stand       Functional mobility during ADLs: Contact guard assist;Rolling walker (2 wheels) General ADL Comments: cues for pacing and breathing integration     Vision Baseline Vision/History: 0 No visual deficits Ability to See in Adequate Light: 0 Adequate Patient Visual Report: No change from baseline       Perception Perception: Within Functional Limits       Praxis Praxis: WFL       Pertinent Vitals/Pain Pain Assessment Pain Assessment: Faces Faces Pain Scale: Hurts whole lot Pain Location: B LE's, chest Pain Descriptors / Indicators: Aching Pain Intervention(s): Repositioned, Patient requesting pain meds-RN notified, Relaxation     Extremity/Trunk Assessment Upper Extremity Assessment  Upper Extremity Assessment: Right hand dominant;Generalized weakness   Lower Extremity  Assessment Lower Extremity Assessment: Generalized weakness   Cervical / Trunk Assessment Cervical / Trunk Assessment: Kyphotic   Communication Communication Communication: No apparent difficulties   Cognition Arousal: Alert Behavior During Therapy: Anxious Cognition: No apparent impairments                               Following commands: Intact       Cueing  General Comments   Cueing Techniques: Verbal cues  SOB with light activity with RR 26 on RA 93% SpO2           Home Living Family/patient expects to be discharged to:: Private residence Living Arrangements: Alone Available Help at Discharge: Friend(s);Family Type of Home: Mobile home Home Access: Stairs to enter Entrance Stairs-Number of Steps: 4-5 Entrance Stairs-Rails: Right;Left;Can reach both Home Layout: One level     Bathroom Shower/Tub: Tub/shower unit (reports she tub bathes only and not interested in a shower seat)   Bathroom Toilet: Standard Bathroom Accessibility: Yes How Accessible: Accessible via wheelchair;Accessible via walker Home Equipment: None   Additional Comments: friend assists with all transportation and groceries      Prior Functioning/Environment Prior Level of Function : History of Falls (last six months);Independent/Modified Independent;Needs assist (reports falling 6 times)       Physical Assist : Mobility (physical);ADLs (physical) Mobility (physical):  (in community) ADLs (physical): IADLs (transportation from friend)        OT Problem List: Decreased strength;Decreased activity tolerance;Impaired balance (sitting and/or standing);Decreased knowledge of use of DME or AE;Decreased knowledge of precautions;Cardiopulmonary status limiting activity;Pain   OT Treatment/Interventions: Self-care/ADL training;Therapeutic exercise;Energy conservation;DME and/or AE instruction;Therapeutic activities;Patient/family education;Balance training      OT  Goals(Current goals can be found in the care plan section)   Acute Rehab OT Goals Patient Stated Goal: to feel stronger OT Goal Formulation: With patient Time For Goal Achievement: 01/31/24 Potential to Achieve Goals: Good ADL Goals Pt Will Perform Lower Body Bathing: with modified independence;sit to/from stand;sitting/lateral leans;with adaptive equipment Pt Will Perform Lower Body Dressing: with modified independence;with adaptive equipment;sitting/lateral leans;sit to/from stand Pt Will Transfer to Toilet: with modified independence;ambulating Pt/caregiver will Perform Home Exercise Program: Increased strength;Independently;With written HEP provided Additional ADL Goal #1: Patient will teach back 3/4 ECT's during ADL's and mobility independently   OT Frequency:  Min 2X/week       AM-PAC OT "6 Clicks" Daily Activity     Outcome Measure Help from another person eating meals?: None Help from another person taking care of personal grooming?: None Help from another person toileting, which includes using toliet, bedpan, or urinal?: A Little Help from another person bathing (including washing, rinsing, drying)?: A Little Help from another person to put on and taking off regular upper body clothing?: A Little Help from another person to put on and taking off regular lower body clothing?: A Little 6 Click Score: 20   End of Session Equipment Utilized During Treatment: Gait belt;Rolling walker (2 wheels) Nurse Communication: Mobility status (Flowsheets for voiding data)  Activity Tolerance: Patient limited by fatigue Patient left: in bed;with call bell/phone within reach;with bed alarm set (was up earlier and mobility specialist, requested retrun to bed from amb to bathroom)  OT Visit Diagnosis: Unsteadiness on feet (R26.81);Muscle weakness (generalized) (M62.81);Pain                Time: 1430-1505 OT Time Calculation (  min): 35 min Charges:  OT General Charges $OT Visit: 1 Visit OT  Evaluation $OT Eval Moderate Complexity: 1 Mod OT Treatments $Self Care/Home Management : 8-22 mins  Daichi Moris OT/L Acute Rehabilitation Department  [phone]  01/17/2024, 3:37 PM

## 2024-01-17 NOTE — Progress Notes (Signed)
 PROGRESS NOTE    COLUMBIA BARBERENA  YNW:[id number] DOB: [birth year] DOA: 01/15/2024 PCP: Roslyn Coombe, MD    Brief Narrative:  68 year old with history of COPD, interstitial lung disease, childhood asthma, GERD, hyperlipidemia, allergic rhinitis on immune therapy and osteoporosis, severe anxiety disorder presented to the emergency room with shortness of breath, fatigue and shaking ongoing for about 2 days.  She woke up shaking and extremely anxious when she anticipated storm is on way to [place] .  Patient is extremely anxious.  She still smokes half pack per day. In the emergency room 98% on room air.  Initial troponins and lactic acid normal.  Chest x-ray with no acute findings but hyperinflated lungs.  CTA PE study negative for PE but shows centrilobular emphysema.  Significantly wheezing.  Admitted for COPD exacerbation.  Subjective: Patient seen and examined.  Sister and her daughter were at the bedside.  Patient was uncontrollably shaking, extremely anxious and talking about how she becomes anxious with thought of bad weather as she [place]. Patient was shaking and tremulous, when I kept talking to her she slow down and it stopped tremors. Still has significant cough and wheezing however most of her symptoms are coming from extreme anxiety.  Remains afebrile. Patient and family tells me that I was the first 1 to tell her that she has damaged her lungs from previous asthma and now smoking.  Assessment & Plan:   COPD/asthma with acute exacerbation: Patient still with significant symptoms.  Needs continuous monitoring.   Aggressive bronchodilator therapy, IV steroids, inhalational steroids, scheduled and as needed bronchodilators, deep breathing exercises, incentive spirometry, chest physiotherapy. Antibiotics due to severity of symptoms.  Azithromycin  for 5 days. Supplemental oxygen to keep saturations more than 90%.  Mostly on room air. Mobilize with PT  OT. Mucolytic therapies, Tessalon  Perles and scheduled Mucinex . May have major component of anxiety.  Continue Prozac .  Will increase dose of Xanax  to 3 times daily as needed to see if symptoms improve.  Chronic leg pain: On gabapentin  and tramadol .  GERD: On PPI.  Smoker: Counseled to quit.  Nicotine  patch.  Anxiety disorder: As above.  Fairly symptomatic.  Increasing dose of Xanax  to help with respiration.  Leukocytosis: Likely from high-dose steroids.  Will monitor.   DVT prophylaxis: enoxaparin  (LOVENOX ) injection 40 mg Start: 01/15/24 2200   Code Status: DNR with limited intervention Family Communication: Sister at the bedside Disposition Plan: Status is: Observation The patient will require care spanning > 2 midnights and should be moved to inpatient because: Significantly symptomatic wheezing.  Needs inpatient monitoring and treatment.     Consultants:  None  Procedures:  None  Antimicrobials:  Azithromycin  5/2---     Objective: Vitals:   01/16/24 2033 01/16/24 2054 01/17/24 0248 01/17/24 0814  BP:  (!) 121/56    Pulse: 85 83 77   Resp: 20 18 18    Temp:  98.4 F (36.9 C)    TempSrc:      SpO2: 95% 96% 96% 96%  Weight:        Intake/Output Summary (Last 24 hours) at 01/17/2024 1439 Last data filed at 01/17/2024 0948 Gross per 24 hour  Intake 460 ml  Output --  Net 460 ml   Filed Weights   01/15/24 1034  Weight: 52.2 kg    Examination:  General exam: Appears tremulous, shaky with variable response on distraction. Respiratory system: On room air.  Extensive upper airway sounds.  Inspiratory and expiratory  wheezes. Cardiovascular system: S1 & S2 heard, RRR. No pedal edema. Gastrointestinal system: Abdomen is nondistended, soft and nontender. No organomegaly or masses felt. Normal bowel sounds heard. Central nervous system: Alert and oriented. No focal neurological deficits. Psychiatry: Judgement and insight appear normal.  Anxious.    Data  Reviewed: I have personally reviewed following labs and imaging studies  CBC: Recent Labs  Lab 01/15/24 1106 01/16/24 0544  WBC 7.8 14.7*  HGB 13.6 11.5*  HCT 41.0 35.7*  MCV 93.6 97.0  PLT 293 248   Basic Metabolic Panel: Recent Labs  Lab 01/15/24 1106 01/16/24 0544  NA 137 136  K 4.5 4.7  CL 104 111  CO2 23 19*  GLUCOSE 100* 121*  BUN 14 18  CREATININE 1.04* 0.79  CALCIUM  9.5 8.6*   GFR: Estimated Creatinine Clearance: 56.2 mL/min (by C-G formula based on SCr of 0.79 mg/dL). Liver Function Tests: Recent Labs  Lab 01/15/24 1106  AST 16  ALT 9  ALKPHOS 74  BILITOT 0.6  PROT 7.2  ALBUMIN 4.4   No results for input(s): "LIPASE", "AMYLASE" in the last 168 hours. No results for input(s): "AMMONIA" in the last 168 hours. Coagulation Profile: No results for input(s): "INR", "PROTIME" in the last 168 hours. Cardiac Enzymes: No results for input(s): "CKTOTAL", "CKMB", "CKMBINDEX", "TROPONINI" in the last 168 hours. BNP (last 3 results) No results for input(s): "PROBNP" in the last 8760 hours. HbA1C: No results for input(s): "HGBA1C" in the last 72 hours. CBG: No results for input(s): "GLUCAP" in the last 168 hours. Lipid Profile: No results for input(s): "CHOL", "HDL", "LDLCALC", "TRIG", "CHOLHDL", "LDLDIRECT" in the last 72 hours. Thyroid  Function Tests: No results for input(s): "TSH", "T4TOTAL", "FREET4", "T3FREE", "THYROIDAB" in the last 72 hours. Anemia Panel: No results for input(s): "VITAMINB12", "FOLATE", "FERRITIN", "TIBC", "IRON ", "RETICCTPCT" in the last 72 hours. Sepsis Labs: Recent Labs  Lab 01/15/24 1112 01/15/24 1401  LATICACIDVEN 2.0* 2.0*    Recent Results (from the past 240 hours)  Blood culture (routine x 2)     Status: None (Preliminary result)   Collection Time: 01/15/24 11:06 AM   Specimen: BLOOD LEFT WRIST  Result Value Ref Range Status   Specimen Description   Final    BLOOD LEFT WRIST Performed at [facility], [address], [place], [place] [address]    Special Requests   Final    BOTTLES DRAWN AEROBIC AND ANAEROBIC Blood Culture results may not be optimal due to an inadequate volume of blood received in culture bottles Performed at [facility], [address] [place], [place] [address]    Culture   Final    NO GROWTH 2 DAYS Performed at [facility], [address], [place], [place] [address]    Report Status PENDING  Incomplete  Blood culture (routine x 2)     Status: None (Preliminary result)   Collection Time: 01/15/24 11:20 AM   Specimen: BLOOD  Result Value Ref Range Status   Specimen Description   Final    BLOOD RIGHT ANTECUBITAL Performed at [facility], [address], [place], [place] [address]    Special Requests   Final    BOTTLES DRAWN AEROBIC AND ANAEROBIC Blood Culture results may not be optimal due to an inadequate volume of blood received in culture bottles Performed at [facility], [address], [place], [place] [address]    Culture   Final    NO GROWTH  2 DAYS Performed at [facility], [address] [place], [place] [address]    Report Status PENDING  Incomplete         Radiology Studies: No results found.      Scheduled Meds:  azithromycin   500 mg Oral Daily   benzonatate   100 mg Oral TID   budesonide  (PULMICORT ) nebulizer solution  0.25 mg Nebulization BID   dextromethorphan -guaiFENesin   1 tablet Oral BID   enoxaparin  (LOVENOX ) injection  40 mg Subcutaneous Q24H   FLUoxetine   20 mg Oral Daily   fluticasone   2 spray Each Nare Daily   gabapentin   600 mg Oral Q1500   ipratropium-albuterol   3 mL Nebulization Q6H   methylPREDNISolone  (SOLU-MEDROL ) injection  40 mg Intravenous BID   montelukast   10 mg Oral QHS   nicotine   14 mg Transdermal Daily   pantoprazole   40 mg Oral Daily   valACYclovir   500 mg Oral BID   Continuous Infusions:   LOS: 0 days       Vada Garibaldi,  MD [facility]

## 2024-01-18 DIAGNOSIS — J441 Chronic obstructive pulmonary disease with (acute) exacerbation: Secondary | ICD-10-CM | POA: Diagnosis not present

## 2024-01-18 LAB — CBC WITH DIFFERENTIAL/PLATELET
Abs Immature Granulocytes: 0.13 10*3/uL — ABNORMAL HIGH (ref 0.00–0.07)
Basophils Absolute: 0 10*3/uL (ref 0.0–0.1)
Basophils Relative: 0 %
Eosinophils Absolute: 0 10*3/uL (ref 0.0–0.5)
Eosinophils Relative: 0 %
HCT: 41.6 % (ref 36.0–46.0)
Hemoglobin: 13.4 g/dL (ref 12.0–15.0)
Immature Granulocytes: 1 %
Lymphocytes Relative: 12 %
Lymphs Abs: 1.3 10*3/uL (ref 0.7–4.0)
MCH: 30.9 pg (ref 26.0–34.0)
MCHC: 32.2 g/dL (ref 30.0–36.0)
MCV: 95.9 fL (ref 80.0–100.0)
Monocytes Absolute: 0.5 10*3/uL (ref 0.1–1.0)
Monocytes Relative: 5 %
Neutro Abs: 9 10*3/uL — ABNORMAL HIGH (ref 1.7–7.7)
Neutrophils Relative %: 82 %
Platelets: 269 10*3/uL (ref 150–400)
RBC: 4.34 MIL/uL (ref 3.87–5.11)
RDW: 13.1 % (ref 11.5–15.5)
WBC: 11 10*3/uL — ABNORMAL HIGH (ref 4.0–10.5)
nRBC: 0 % (ref 0.0–0.2)

## 2024-01-18 LAB — BASIC METABOLIC PANEL WITH GFR
Anion gap: 8 (ref 5–15)
BUN: 24 mg/dL — ABNORMAL HIGH (ref 8–23)
CO2: 23 mmol/L (ref 22–32)
Calcium: 9.7 mg/dL (ref 8.9–10.3)
Chloride: 105 mmol/L (ref 98–111)
Creatinine, Ser: 0.78 mg/dL (ref 0.44–1.00)
GFR, Estimated: >60 mL/min (ref >60)
Glucose, Bld: 117 mg/dL — ABNORMAL HIGH (ref 70–99)
Potassium: 4.8 mmol/L (ref 3.5–5.1)
Sodium: 136 mmol/L (ref 135–145)

## 2024-01-18 LAB — PHOSPHORUS: Phosphorus: 3.6 mg/dL (ref 2.5–4.6)

## 2024-01-18 LAB — MAGNESIUM: Magnesium: 2.1 mg/dL (ref 1.7–2.4)

## 2024-01-18 MED ORDER — METHYLPREDNISOLONE SODIUM SUCC 125 MG IJ SOLR
80.0000 mg | Freq: Two times a day (BID) | INTRAMUSCULAR | Status: DC
  Administered 2024-01-18 – 2024-01-19 (×2): 80 mg via INTRAVENOUS
  Filled 2024-01-18 (×2): qty 2

## 2024-01-18 NOTE — Plan of Care (Signed)
  Problem: Education: Goal: Knowledge of General Education information will improve Description: Including pain rating scale, medication(s)/side effects and non-pharmacologic comfort measures Outcome: Progressing   Problem: Health Behavior/Discharge Planning: Goal: Ability to manage health-related needs will improve Outcome: Progressing   Problem: Clinical Measurements: Goal: Diagnostic test results will improve Outcome: Not Progressing Goal: Respiratory complications will improve Outcome: Progressing   Problem: Activity: Goal: Risk for activity intolerance will decrease Outcome: Progressing   Problem: Nutrition: Goal: Adequate nutrition will be maintained Outcome: Progressing   Problem: Coping: Goal: Level of anxiety will decrease Outcome: Not Progressing   Problem: Pain Managment: Goal: General experience of comfort will improve and/or be controlled Outcome: Progressing

## 2024-01-18 NOTE — Progress Notes (Signed)
 PROGRESS NOTE    Lori [name]  EXB:[id number] DOB: 02/09/1956 DOA: 01/15/2024 PCP: Roslyn Coombe, MD   Brief Narrative:  68 year old with history of COPD, interstitial lung disease, childhood asthma, GERD, hyperlipidemia, allergic rhinitis on immune therapy and osteoporosis, severe anxiety disorder and ongoing tobacco abuse presented with worsening shortness of breath and anxiety.  On presentation, CTA PE study was negative for PE but showed centrilobular emphysema.  She was admitted for COPD exacerbation and started on IV steroids along with nebs.  Assessment & Plan:   COPD/asthma exacerbation Ongoing tobacco abuse and pulmonary - Still does not feel well and does not feel ready to go home today.  Still wheezing.  Increase Solu-Medrol  to 80 mg IV every 12 hours.  Continue current nebs.  Continue Zithromax  and montelukast .  Outpatient follow-up with pulmonary.  Counseled regarding tobacco cessation.  Continue nicotine  patch  Anxiety disorder - Still very anxious.  Continue Prozac  along with as needed Xanax .  GERD Continue PPI  Chronic leg pain Plan continue gabapentin  and tramadol   Leukocytosis - Likely reactive.  Improving.  Monitor intermittently  DVT prophylaxis: Lovenox  Code Status: DNR Family Communication: Friend at bedside Disposition Plan: Status is: Inpatient Remains inpatient appropriate because: Of severity of illness  Consultants: None  Procedures: None  Antimicrobials: Zithromax  from 01/16/2024 onwards   Subjective: Patient seen and examined at bedside.  Still complains of more wheezing and shortness of breath with minimal exertion.  Does not feel ready to go home today.  No fever, chest pain, abdominal reported. Objective: Vitals:   01/17/24 2103 01/18/24 0245 01/18/24 0436 01/18/24 0757  BP: 125/63  136/67   Pulse: 86  74   Resp: 18  18   Temp: 98 F (36.7 C)  97.7 F (36.5 C)   TempSrc: Oral  Oral   SpO2: 97% 94% 94% 94%  Weight:         Intake/Output Summary (Last 24 hours) at 01/18/2024 1028 Last data filed at 01/18/2024 0900 Gross per 24 hour  Intake 700 ml  Output --  Net 700 ml   Filed Weights   01/15/24 1034  Weight: 52.2 kg    Examination:  General exam: On room air.  Looks chronically ill and deconditioned.  Thinly built.   Respiratory system: Bilateral decreased breath sounds at bases with scattered wheezing Cardiovascular system: S1 & S2 heard, Rate controlled Gastrointestinal system: Abdomen is nondistended, soft and nontender. Normal bowel sounds heard. Extremities: No cyanosis, clubbing, edema  Central nervous system: Awake, slow to respond, poor historian. No focal neurological deficits. Moving extremities Skin: No rashes, lesions or ulcers Psychiatry: Looks anxious.  Not agitated.    Data Reviewed: I have personally reviewed following labs and imaging studies  CBC: Recent Labs  Lab 01/15/24 1106 01/16/24 0544 01/18/24 0639  WBC 7.8 14.7* 11.0*  NEUTROABS  --   --  9.0*  HGB 13.6 11.5* 13.4  HCT 41.0 35.7* 41.6  MCV 93.6 97.0 95.9  PLT 293 248 269   Basic Metabolic Panel: Recent Labs  Lab 01/15/24 1106 01/16/24 0544 01/18/24 0639  NA 137 136 136  K 4.5 4.7 4.8  CL 104 111 105  CO2 23 19* 23  GLUCOSE 100* 121* 117*  BUN 14 18 24*  CREATININE 1.04* 0.79 0.78  CALCIUM  9.5 8.6* 9.7  MG  --   --  2.1  PHOS  --   --  3.6   GFR: Estimated Creatinine Clearance: 56.2 mL/min (by C-G formula based on  SCr of 0.78 mg/dL). Liver Function Tests: Recent Labs  Lab 01/15/24 1106  AST 16  ALT 9  ALKPHOS 74  BILITOT 0.6  PROT 7.2  ALBUMIN 4.4   No results for input(s): "LIPASE", "AMYLASE" in the last 168 hours. No results for input(s): "AMMONIA" in the last 168 hours. Coagulation Profile: No results for input(s): "INR", "PROTIME" in the last 168 hours. Cardiac Enzymes: No results for input(s): "CKTOTAL", "CKMB", "CKMBINDEX", "TROPONINI" in the last 168 hours. BNP (last 3  results) No results for input(s): "PROBNP" in the last 8760 hours. HbA1C: No results for input(s): "HGBA1C" in the last 72 hours. CBG: No results for input(s): "GLUCAP" in the last 168 hours. Lipid Profile: No results for input(s): "CHOL", "HDL", "LDLCALC", "TRIG", "CHOLHDL", "LDLDIRECT" in the last 72 hours. Thyroid  Function Tests: No results for input(s): "TSH", "T4TOTAL", "FREET4", "T3FREE", "THYROIDAB" in the last 72 hours. Anemia Panel: No results for input(s): "VITAMINB12", "FOLATE", "FERRITIN", "TIBC", "IRON ", "RETICCTPCT" in the last 72 hours. Sepsis Labs: Recent Labs  Lab 01/15/24 1112 01/15/24 1401  LATICACIDVEN 2.0* 2.0*    Recent Results (from the past 240 hours)  Blood culture (routine x 2)     Status: None (Preliminary result)   Collection Time: 01/15/24 11:06 AM   Specimen: BLOOD LEFT WRIST  Result Value Ref Range Status   Specimen Description   Final    BLOOD LEFT WRIST Performed at [facility], [address] [place], [place] [address]    Special Requests   Final    BOTTLES DRAWN AEROBIC AND ANAEROBIC Blood Culture results may not be optimal due to an inadequate volume of blood received in culture bottles Performed at [facility], [address], [place], [place] [address]    Culture   Final    NO GROWTH 3 DAYS Performed at [facility], [address], [place], [place] [address]    Report Status PENDING  Incomplete  Blood culture (routine x 2)     Status: None (Preliminary result)   Collection Time: 01/15/24 11:20 AM   Specimen: BLOOD  Result Value Ref Range Status   Specimen Description   Final    BLOOD RIGHT ANTECUBITAL Performed at [facility], [address], [place], [place] [address]    Special Requests   Final    BOTTLES DRAWN AEROBIC AND ANAEROBIC Blood Culture results may not be optimal due to an inadequate volume of blood received in culture bottles Performed at [facility], [address], [place], [place] [address]    Culture   Final    NO GROWTH 3 DAYS Performed at [facility], [address], [place], [place] [address]    Report Status PENDING  Incomplete         Radiology Studies: No results found.      Scheduled Meds:  azithromycin   500 mg Oral Daily   benzonatate   100 mg Oral TID   budesonide  (PULMICORT ) nebulizer solution  0.25 mg Nebulization BID   dextromethorphan -guaiFENesin   1 tablet Oral BID   enoxaparin  (LOVENOX ) injection  40 mg Subcutaneous Q24H   FLUoxetine   20 mg Oral Daily   fluticasone   2 spray Each Nare Daily   gabapentin   600 mg Oral Q1500   ipratropium-albuterol   3 mL Nebulization Q6H   methylPREDNISolone  (SOLU-MEDROL ) injection  40 mg Intravenous BID   montelukast   10 mg Oral QHS   nicotine   14 mg Transdermal Daily  pantoprazole   40 mg Oral Daily   valACYclovir   500 mg Oral BID   Continuous Infusions:        Audria Leather, MD [facility] 01/18/2024, 10:28 AM '

## 2024-01-18 NOTE — Evaluation (Signed)
 Physical Therapy Evaluation Patient Details Name: Lori [name] MRN: [id number] DOB: 23-Sep-1955 Today's Date: 01/18/2024  History of Present Illness  Patient is a 68 yo female admitted to [facility] with COPD exacerbation.  PMH: CAD, COPD, GERD, anxiety, depression, hiatal hernia, HLD, neck surgery, osteoporosis.  Clinical Impression   PTA, patient lives alone and was independent with ambulation and B/IADL's needing assistance from friend who assists PRN from outside the home for groceries and transportation. Currently, patient presents with deficits outlined below (see PT Problem List for details) most significantly pain, generalized weakness, decreased balance and activity tolerance. PT recommending HHPT with as much initial caregiver support as possible and a rollator for energy conservation and falls prevention as patient reports recent falls history. PT will continue to follow acutely to maximize IND and safety for dc home.      If plan is discharge home, recommend the following: A little help with walking and/or transfers;A little help with bathing/dressing/bathroom;Assistance with cooking/housework;Assist for transportation;Help with stairs or ramp for entrance   Can travel by private vehicle        Equipment Recommendations Rollator (4 wheels)  Recommendations for Other Services       Functional Status Assessment Patient has had a recent decline in their functional status and demonstrates the ability to make significant improvements in function in a reasonable and predictable amount of time.     Precautions / Restrictions Precautions Precautions: None;Fall Precaution/Restrictions Comments: watch O2 sats Restrictions Weight Bearing Restrictions Per Provider Order: No      Mobility  Bed Mobility Overal bed mobility: Modified Independent             General bed mobility comments: HoB elevated 45 degrees    Transfers Overall transfer level: Needs assistance Equipment  used: Rollator (4 wheels) Transfers: Sit to/from Stand Sit to Stand: Contact guard assist, Supervision   Step pivot transfers: Contact guard assist       General transfer comment: min cues for rollator integration and proximity    Ambulation/Gait Ambulation/Gait assistance: Contact guard assist Gait Distance (Feet): 56 Feet (and additional 20') Assistive device: Rollator (4 wheels) Gait Pattern/deviations: Step-to pattern, Step-through pattern, Decreased step length - right, Decreased step length - left, Shuffle, Trunk flexed Gait velocity: decr     General Gait Details: Steady assist, cues for posture, position from Rollator and pace.  Improving stability with improved distance ambulated  Stairs            Wheelchair Mobility     Tilt Bed    Modified Rankin (Stroke Patients Only)       Balance Overall balance assessment: Mild deficits observed, not formally tested                                           Pertinent Vitals/Pain Pain Assessment Pain Assessment: Faces Faces Pain Scale: Hurts even more Pain Location: B LE's, chest Pain Descriptors / Indicators: Aching Pain Intervention(s): Limited activity within patient's tolerance, Monitored during session, Premedicated before session    Home Living Family/patient expects to be discharged to:: Private residence Living Arrangements: Alone Available Help at Discharge: Friend(s);Family Type of Home: Mobile home Home Access: Stairs to enter Entrance Stairs-Rails: Right;Left;Can reach both Entrance Stairs-Number of Steps: 4-5   Home Layout: One level Home Equipment: None Additional Comments: friend assists with all transportation and groceries; pt reports she can initially have someone  stay with her but vague on who that would be    Prior Function Prior Level of Function : History of Falls (last six months);Independent/Modified Independent;Needs assist       Physical Assist : Mobility  (physical);ADLs (physical) Mobility (physical):  (Pt admits to furniture walking and does not leave the house without companion) ADLs (physical): IADLs         Extremity/Trunk Assessment   Upper Extremity Assessment Upper Extremity Assessment: Right hand dominant    Lower Extremity Assessment Lower Extremity Assessment: Generalized weakness    Cervical / Trunk Assessment Cervical / Trunk Assessment: Kyphotic  Communication   Communication Communication: No apparent difficulties    Cognition Arousal: Alert Behavior During Therapy: Anxious   PT - Cognitive impairments: No apparent impairments                         Following commands: Intact       Cueing Cueing Techniques: Verbal cues     General Comments General comments (skin integrity, edema, etc.): Easily SOB with exertion    Exercises     Assessment/Plan    PT Assessment Patient needs continued PT services  PT Problem List Decreased strength;Decreased range of motion;Decreased activity tolerance;Decreased balance;Decreased mobility;Decreased knowledge of use of DME;Pain       PT Treatment Interventions DME instruction;Gait training;Stair training;Functional mobility training;Therapeutic activities;Therapeutic exercise;Balance training;Patient/family education    PT Goals (Current goals can be found in the Care Plan section)  Acute Rehab PT Goals Patient Stated Goal: Home for as long as I can manage PT Goal Formulation: With patient Time For Goal Achievement: 02/01/24 Potential to Achieve Goals: Good    Frequency Min 3X/week     Co-evaluation               AM-PAC PT "6 Clicks" Mobility  Outcome Measure Help needed turning from your back to your side while in a flat bed without using bedrails?: None Help needed moving from lying on your back to sitting on the side of a flat bed without using bedrails?: None Help needed moving to and from a bed to a chair (including a wheelchair)?:  A Little Help needed standing up from a chair using your arms (e.g., wheelchair or bedside chair)?: A Little Help needed to walk in hospital room?: A Little Help needed climbing 3-5 steps with a railing? : A Lot 6 Click Score: 19    End of Session Equipment Utilized During Treatment: Gait belt Activity Tolerance: Patient limited by fatigue Patient left: in bed Nurse Communication: Mobility status;Other (comment) (vital signs) PT Visit Diagnosis: Unsteadiness on feet (R26.81);Muscle weakness (generalized) (M62.81);Difficulty in walking, not elsewhere classified (R26.2);Pain    Time: 2956-2130 PT Time Calculation (min) (ACUTE ONLY): 21 min   Charges:   PT Evaluation $PT Eval Low Complexity: 1 Low   PT General Charges $$ ACUTE PT VISIT: 1 Visit         Thedora Finlay PT Acute Rehabilitation Services Pager [phone] Office [phone]   Demetrias Goodbar 01/18/2024, 3:54 PM

## 2024-01-19 DIAGNOSIS — J441 Chronic obstructive pulmonary disease with (acute) exacerbation: Secondary | ICD-10-CM | POA: Diagnosis not present

## 2024-01-19 MED ORDER — ONDANSETRON HCL 4 MG/2ML IJ SOLN
4.0000 mg | Freq: Four times a day (QID) | INTRAMUSCULAR | Status: DC | PRN
  Administered 2024-01-19: 4 mg via INTRAVENOUS
  Filled 2024-01-19: qty 2

## 2024-01-19 MED ORDER — DM-GUAIFENESIN ER 30-600 MG PO TB12: 1.0000 | ORAL_TABLET | Freq: Two times a day (BID) | ORAL | 0 refills | Status: AC

## 2024-01-19 MED ORDER — DEXLANSOPRAZOLE 60 MG PO CPDR: 1.0000 | DELAYED_RELEASE_CAPSULE | Freq: Every day | ORAL | Status: DC

## 2024-01-19 MED ORDER — PREDNISONE 20 MG PO TABS: 40.0000 mg | ORAL_TABLET | Freq: Every day | ORAL | 0 refills | Status: AC

## 2024-01-19 MED ORDER — BENZONATATE 100 MG PO CAPS
100.0000 mg | ORAL_CAPSULE | Freq: Three times a day (TID) | ORAL | 0 refills | Status: DC
Start: 2024-01-19 — End: 2024-01-23

## 2024-01-19 MED ORDER — ONDANSETRON HCL 4 MG PO TABS: 4.0000 mg | ORAL_TABLET | Freq: Three times a day (TID) | ORAL | 0 refills | Status: AC | PRN

## 2024-01-19 MED ORDER — BREZTRI AEROSPHERE 160-9-4.8 MCG/ACT IN AERO: 2.0000 | INHALATION_SPRAY | Freq: Two times a day (BID) | RESPIRATORY_TRACT | Status: DC

## 2024-01-19 NOTE — Progress Notes (Signed)
 Occupational Therapy Treatment/Discharge  Patient Details Name: Lori [name] MRN: [id number] DOB: 1956/08/15 Today's Date: 01/19/2024   History of present illness Patient is a 68 yo female admitted to [facility] with COPD exacerbation.  PMH: CAD, COPD, GERD, anxiety, depression, hiatal hernia, HLD, neck surgery, osteoporosis.   OT comments  Patient seen this am for skilled OT session. Training with ECTs and use of rollator for toileting and light ADL's completed.Patient open to all education and friend present who will assist with discharge and post d/c. OT recommending rollator and HHOT services as patient lives alone and has hx of falls. No further OT needs acutely with current POC goals met.       If plan is discharge home, recommend the following:  A little help with walking and/or transfers;A little help with bathing/dressing/bathroom;Assistance with cooking/housework;Direct supervision/assist for medications management;Direct supervision/assist for financial management;Assist for transportation;Help with stairs or ramp for entrance   Equipment Recommendations   (rollator)       Precautions / Restrictions Precautions Precautions: None;Fall Precaution/Restrictions Comments: watch O2 sats Restrictions Weight Bearing Restrictions Per Provider Order: No       Mobility Bed Mobility Overal bed mobility: Modified Independent                  Transfers Overall transfer level: Modified independent Equipment used: Rollator (4 wheels)                         ADL either performed or assessed with clinical judgement   ADL Overall ADL's : Modified independent                                     Functional mobility during ADLs: Modified independent;Rollator (4 wheels) General ADL Comments: able to teach back use of rollator and 3 other ECTs durign BADL's and household mobility -    Extremity/Trunk Assessment Upper Extremity Assessment Upper Extremity  Assessment: Generalized weakness;Right hand dominant   Lower Extremity Assessment Lower Extremity Assessment: Generalized weakness           Perception Perception Perception: Within Functional Limits   Praxis Praxis Praxis: WFL   Communication Communication Communication: No apparent difficulties   Cognition Arousal: Alert Behavior During Therapy: Anxious Cognition: No apparent impairments                               Following commands: Intact        Cueing   Cueing Techniques: Verbal cues        General Comments RR at most 22 this session with light in room, bathroom and ADL's with rollator on RA    Pertinent Vitals/ Pain       Pain Assessment Pain Assessment: Faces Faces Pain Scale: Hurts little more Pain Location: B LE's, chest Pain Descriptors / Indicators: Sore Pain Intervention(s): Monitored during session, Relaxation   Frequency  Other (comment) (d/c completed this session)        Progress Toward Goals  OT Goals(current goals can now be found in the care plan section)  Progress towards OT goals: Goals met/education completed, patient discharged from OT   All S/LTG's met for Acute OT    AM-PAC OT "6 Clicks" Daily Activity     Outcome Measure   Help from another person eating meals?: None Help from another person taking care  of personal grooming?: None Help from another person toileting, which includes using toliet, bedpan, or urinal?: None Help from another person bathing (including washing, rinsing, drying)?: None Help from another person to put on and taking off regular upper body clothing?: None Help from another person to put on and taking off regular lower body clothing?: None 6 Click Score: 24    End of Session Equipment Utilized During Treatment: Gait belt;Rolling walker (2 wheels)  OT Visit Diagnosis: Unsteadiness on feet (R26.81);Muscle weakness (generalized) (M62.81);Pain   Activity Tolerance Patient tolerated  treatment well   Patient Left in bed;with call bell/phone within reach;with bed alarm set;with family/visitor present   Nurse Communication Mobility status        Time: 7829-5621 OT Time Calculation (min): 25 min  Charges: OT General Charges $OT Visit: 1 Visit OT Treatments $Self Care/Home Management : 23-37 mins  Annica Marinello OT/L Acute Rehabilitation Department  [phone]  01/19/2024, 11:49 AM

## 2024-01-19 NOTE — Discharge Summary (Signed)
 Physician Discharge Summary  SIMISOLA CHRISP ZOX:[id number] DOB: 02-25-1956 DOA: 01/15/2024  PCP: Roslyn Coombe, MD  Admit date: 01/15/2024 Discharge date: 01/19/2024  Admitted From: Home Disposition: Home  Recommendations for Outpatient Follow-up:  Follow up with PCP in 1 week with repeat CBC/BMP Recommend outpatient evaluation and follow-up with pulmonary Patient needs to stop smoking Follow up in ED if symptoms worsen or new appear   Home Health: No Equipment/Devices: None  Discharge Condition: Stable CODE STATUS: DNR Diet recommendation: Heart healthy  Brief/Interim Summary: 68 year old with history of COPD, interstitial lung disease, childhood asthma, GERD, hyperlipidemia, allergic rhinitis on immune therapy and osteoporosis, severe anxiety disorder and ongoing tobacco abuse presented with worsening shortness of breath and anxiety.  On presentation, CTA PE study was negative for PE but showed centrilobular emphysema.  She was admitted for COPD exacerbation and started on IV steroids along with nebs.  During the hospitalization, her condition has improved.  She remains on room air and feels better enough to go home today.  She will be discharged home on oral prednisone .  Outpatient follow-up with PCP and recommend evaluation and follow-up by pulmonary.  Discharge Diagnoses:   COPD/asthma exacerbation Ongoing tobacco abuse and pulmonary - Treated with high-dose IV steroids along with nebs, Zithromax  and montelukast  during the hospitalization.  Continue Zithromax  and montelukast .   -Outpatient follow-up with pulmonary.  Counseled regarding tobacco cessation.  -During the hospitalization, her condition has improved.  She remains on room air and feels better enough to go home today.  She will be discharged home on oral prednisone  40 mg daily for 7 days starting from tomorrow..  Continue home inhaled regimen.  Continue montelukast .  Outpatient follow-up with PCP and recommend evaluation and  follow-up by pulmonary.   Anxiety disorder - Continue Prozac  along with as needed Xanax .   GERD -Continue PPI   Chronic leg pain -continue gabapentin  and tramadol .  Outpatient follow-up with PCP.   Leukocytosis - Likely reactive.  Improving.  Monitor intermittently   Discharge Instructions  Discharge Instructions     Diet - low sodium heart healthy   Complete by: As directed    Increase activity slowly   Complete by: As directed    Pulmonary Visit   Complete by: As directed    copd   Reason for referral: Other Pulmonary      Allergies as of 01/19/2024       Reactions   Advil  [ibuprofen ] Nausea And Vomiting   Bee Venom Swelling   Codeine  Nausea Only, Other (See Comments)   CAUSES ULCERS   Clonidine Derivatives Other (See Comments)   Unknown    Doxycycline  Nausea Only   Other Other (See Comments)   Pet dander and mold: Takes shots to address these   Statins Other (See Comments)   unknown   Latex Rash   Levaquin  [levofloxacin ] Nausea Only   Propoxyphene N-acetaminophen  Nausea Only        Medication List     STOP taking these medications    promethazine  12.5 MG tablet Commonly known as: PHENERGAN        TAKE these medications    Aimovig 140 MG/ML Soaj Generic drug: Erenumab-aooe Inject 140 mg into the skin every 30 (thirty) days.   albuterol  108 (90 Base) MCG/ACT inhaler Commonly known as: ProAir  HFA Inhale 2 puffs into the lungs every 6 (six) hours as needed for wheezing or shortness of breath. What changed: when to take this   ALPRAZolam  1 MG tablet Commonly known as:  XANAX  TAKE 1 TABLET BY MOUTH AT BEDTIME AS NEEDED FOR ANXIETY What changed: See the new instructions.   benzonatate  100 MG capsule Commonly known as: TESSALON  Take 1 capsule (100 mg total) by mouth 3 (three) times daily.   Breztri  Aerosphere 160-9-4.8 MCG/ACT Aero inhaler Generic drug: budeson-glycopyrrolate -formoterol  Inhale 2 puffs into the lungs 2 (two) times daily.    cetirizine  10 MG tablet Commonly known as: ZyrTEC  Allergy take1 tablet by mouth once a day as needed for runny nose/drainage down throat What changed:  how much to take how to take this when to take this additional instructions   dexlansoprazole  60 MG capsule Commonly known as: Dexilant  Take 1 capsule (60 mg total) by mouth daily before breakfast.   dextromethorphan -guaiFENesin  30-600 MG 12hr tablet Commonly known as: MUCINEX  DM Take 1 tablet by mouth 2 (two) times daily.   EPINEPHrine  0.3 mg/0.3 mL Soaj injection Commonly known as: EPI-PEN Inject 0.3 mg into the muscle as needed for anaphylaxis. Do not refrigerate.   Fasenra  30 MG/ML prefilled syringe Generic drug: benralizumab  INJECT 30MG  SUBCUTANEOUSLY EVERY 8 WEEKS   FLUoxetine  20 MG capsule Commonly known as: PROZAC  Take 1 capsule (20 mg total) by mouth daily.   gabapentin  600 MG tablet Commonly known as: NEURONTIN  Take 600 mg by mouth daily in the afternoon.   meclizine  12.5 MG tablet Commonly known as: ANTIVERT  TAKE 1 TABLET BY MOUTH THREE TIMES DAILY AS NEEDED FOR DIZZINESS   montelukast  10 MG tablet Commonly known as: SINGULAIR  Take 1 tablet (10 mg total) by mouth at bedtime.   nitroGLYCERIN  0.4 MG SL tablet Commonly known as: NITROSTAT  Place 1 tablet (0.4 mg total) under the tongue every 5 (five) minutes as needed for chest pain.   ondansetron  4 MG tablet Commonly known as: Zofran  Take 1 tablet (4 mg total) by mouth every 8 (eight) hours as needed for nausea or vomiting.   predniSONE  20 MG tablet Commonly known as: DELTASONE  Take 2 tablets (40 mg total) by mouth daily with breakfast for 7 days. Start taking on: Jan 20, 2024   tiZANidine  2 MG tablet Commonly known as: ZANAFLEX  Take 1 tablet (2 mg total) by mouth every 8 (eight) hours as needed for muscle spasms.   traMADol  50 MG tablet Commonly known as: ULTRAM  Take 1 tablet (50 mg total) by mouth every 6 (six) hours as needed. What changed:  reasons to take this   valACYclovir  500 MG tablet Commonly known as: VALTREX  Take 1 tablet (500 mg total) by mouth 2 (two) times daily.   zolpidem  5 MG tablet Commonly known as: AMBIEN  Take 1 tablet (5 mg total) by mouth at bedtime as needed for sleep.        Follow-up Information     Roslyn Coombe, MD. Schedule an appointment as soon as possible for a visit in 1 week(s).   Specialties: Internal Medicine, Radiology Contact information: [address] [place] [address] [phone]                Allergies  Allergen Reactions   Advil  [Ibuprofen ] Nausea And Vomiting   Bee Venom Swelling   Codeine  Nausea Only and Other (See Comments)    CAUSES ULCERS   Clonidine Derivatives Other (See Comments)    Unknown    Doxycycline  Nausea Only   Other Other (See Comments)    Pet dander and mold: Takes shots to address these   Statins Other (See Comments)    unknown   Latex Rash  Levaquin  [Levofloxacin ] Nausea Only   Propoxyphene N-Acetaminophen  Nausea Only    Consultations: None   Procedures/Studies: CT Angio Chest Pulmonary Embolism (PE) W or WO Contrast Result Date: 01/15/2024 CLINICAL DATA:  Pulmonary embolism (PE) suspected, high prob, tremors, central chest pain EXAM: CT ANGIOGRAPHY CHEST WITH CONTRAST TECHNIQUE: Multidetector CT imaging of the chest was performed using the standard protocol during bolus administration of intravenous contrast. Multiplanar CT image reconstructions and MIPs were obtained to evaluate the vascular anatomy. RADIATION DOSE REDUCTION: This exam was performed according to the departmental dose-optimization program which includes automated exposure control, adjustment of the mA and/or kV according to patient size and/or use of iterative reconstruction technique. CONTRAST:  75mL OMNIPAQUE  IOHEXOL  350 MG/ML SOLN COMPARISON:  May 14, 2023, February 16, 2017 FINDINGS: Pulmonary Embolism: No pulmonary embolism. Cardiovascular: No cardiomegaly or  pericardial effusion. No aortic aneurysm. Scattered aortic and coronary artery atherosclerosis. Mediastinum/Nodes: No mediastinal mass. No mediastinal, hilar, or axillary lymphadenopathy. Lungs/Pleura: The trachea is midline and patent. Biapical pleuroparenchymal scarring with bullous change along the medial right lung apex. Mild diffuse bronchial wall thickening with areas of mucous plugging in both lower lobes. Moderate centrilobular emphysema. No focal airspace consolidation, pleural effusion, or pneumothorax. Unchanged 3 mm subpleural nodule in the left upper lobe (axial 21). Musculoskeletal: No acute fracture or destructive bone lesion. Chronic, healed left anterior rib fractures. Osteopenia. Multilevel degenerative disc disease of the spine. Upper Abdomen: No acute abnormality in the partially visualized upper abdomen. Cholecystectomy. Review of the MIP images confirms the above findings. IMPRESSION: 1. No pulmonary embolism. 2. Moderate centrilobular emphysema. Diffuse bronchial wall thickening with areas of mucous plugging in both lower lobes, may reflect changes of acute or chronic bronchitis. No superimposed pneumonia, pleural effusion, or pulmonary edema. Aortic Atherosclerosis (ICD10-I70.0) and Emphysema (ICD10-J43.9). Electronically Signed   By: Rance Burrows M.D.   On: 01/15/2024 15:02   DG Chest Port 1 View Result Date: 01/15/2024 CLINICAL DATA:  Shortness of breath and chest pain. EXAM: PORTABLE CHEST 1 VIEW COMPARISON:  09/30/2023 FINDINGS: Lungs appear hyperexpanded. The lungs are clear without focal pneumonia, edema, pneumothorax or pleural effusion. The cardiopericardial silhouette is within normal limits for size. Nonacute left rib fractures evident. Telemetry leads overlie the chest. IMPRESSION: Hyperexpansion without acute cardiopulmonary findings. Electronically Signed   By: Donnal Fusi M.D.   On: 01/15/2024 12:30   DG Lumbar Spine Complete Result Date: 01/13/2024 CLINICAL DATA:   Acute on chronic low back pain after 2 falls. EXAM: LUMBAR SPINE - COMPLETE 4+ VIEW COMPARISON:  Lumbar radiograph 04/30/2023 FINDINGS: The bones are subjectively under mineralized. 5 non-rib-bearing lumbar vertebra with diminutive ribs at T12. Normal lumbar alignment. No evidence of acute fracture compression deformity. Lower lumbar facet hypertrophy is unchanged from prior exam. Minor anterior spurring at multiple levels, slight L1-L2 and L2-L3 disc space narrowing. No sacroiliac diastasis. IMPRESSION: 1. No acute fracture or subluxation of the lumbar spine. 2. Mild degenerative disc disease and lower lumbar facet hypertrophy. Electronically Signed   By: Chadwick Colonel M.D.   On: 01/13/2024 11:05      Subjective: Patient seen and examined at bedside.  Feels better today.  Still short of breath with exertion with intermittent cough.  No fever or vomiting reported.  Discharge Exam: Vitals:   01/19/24 0826 01/19/24 0830  BP:    Pulse:    Resp:    Temp:    SpO2: 97% 97%    General: Pt is alert, awake, not in acute distress.  On room  air.  Looks chronically ill and deconditioned. Cardiovascular: rate controlled, S1/S2 + Respiratory: bilateral decreased breath sounds at bases with scattered minimal wheezing Abdominal: Soft, NT, ND, bowel sounds + Extremities: Trace lower extremity edema; no cyanosis    The results of significant diagnostics from this hospitalization (including imaging, microbiology, ancillary and laboratory) are listed below for reference.     Microbiology: Recent Results (from the past 240 hours)  Blood culture (routine x 2)     Status: None (Preliminary result)   Collection Time: 01/15/24 11:06 AM   Specimen: BLOOD LEFT WRIST  Result Value Ref Range Status   Specimen Description   Final    BLOOD LEFT WRIST Performed at [facility], [address] [place], [place] [address]    Special Requests   Final    BOTTLES DRAWN AEROBIC AND ANAEROBIC Blood Culture  results may not be optimal due to an inadequate volume of blood received in culture bottles Performed at [facility], [address], [place], [place] [address]    Culture   Final    NO GROWTH 4 DAYS Performed at [facility], [address], [place], [place] [address]    Report Status PENDING  Incomplete  Blood culture (routine x 2)     Status: None (Preliminary result)   Collection Time: 01/15/24 11:20 AM   Specimen: BLOOD  Result Value Ref Range Status   Specimen Description   Final    BLOOD RIGHT ANTECUBITAL Performed at [facility], [address], [place], [place] [address]    Special Requests   Final    BOTTLES DRAWN AEROBIC AND ANAEROBIC Blood Culture results may not be optimal due to an inadequate volume of blood received in culture bottles Performed at [facility], [address], [place], [place] [address]    Culture   Final    NO GROWTH 4 DAYS Performed at [facility], [address], [place], [place] [address]    Report Status PENDING  Incomplete     Labs: BNP (last 3 results) No results for input(s): "BNP" in the last 8760 hours. Basic Metabolic Panel: Recent Labs  Lab 01/15/24 1106 01/16/24 0544 01/18/24 0639  NA 137 136 136  K 4.5 4.7 4.8  CL 104 111 105  CO2 23 19* 23  GLUCOSE 100* 121* 117*  BUN 14 18 24*  CREATININE 1.04* 0.79 0.78  CALCIUM  9.5 8.6* 9.7  MG  --   --  2.1  PHOS  --   --  3.6   Liver Function Tests: Recent Labs  Lab 01/15/24 1106  AST 16  ALT 9  ALKPHOS 74  BILITOT 0.6  PROT 7.2  ALBUMIN 4.4   No results for input(s): "LIPASE", "AMYLASE" in the last 168 hours. No results for input(s): "AMMONIA" in the last 168 hours. CBC: Recent Labs  Lab 01/15/24 1106 01/16/24 0544 01/18/24 0639  WBC 7.8 14.7* 11.0*  NEUTROABS  --   --  9.0*  HGB 13.6 11.5* 13.4  HCT 41.0 35.7* 41.6  MCV 93.6 97.0 95.9  PLT 293 248 269   Cardiac Enzymes: No results for  input(s): "CKTOTAL", "CKMB", "CKMBINDEX", "TROPONINI" in the last 168 hours. BNP: Invalid input(s): "POCBNP" CBG: No results for input(s): "GLUCAP" in the last 168 hours. D-Dimer No results for input(s): "DDIMER" in the last 72 hours. Hgb A1c No results for input(s): "HGBA1C" in the last 72 hours. Lipid Profile No results for  input(s): "CHOL", "HDL", "LDLCALC", "TRIG", "CHOLHDL", "LDLDIRECT" in the last 72 hours. Thyroid  function studies No results for input(s): "TSH", "T4TOTAL", "T3FREE", "THYROIDAB" in the last 72 hours.  Invalid input(s): "FREET3" Anemia work up No results for input(s): "VITAMINB12", "FOLATE", "FERRITIN", "TIBC", "IRON ", "RETICCTPCT" in the last 72 hours. Urinalysis    Component Value Date/Time   COLORURINE YELLOW 12/31/2023 1453   APPEARANCEUR CLEAR 12/31/2023 1453   LABSPEC 1.008 12/31/2023 1453   PHURINE 5.0 12/31/2023 1453   GLUCOSEU NEGATIVE 12/31/2023 1453   GLUCOSEU NEGATIVE 12/29/2023 0955   HGBUR NEGATIVE 12/31/2023 1453   BILIRUBINUR NEGATIVE 12/31/2023 1453   BILIRUBINUR negative 09/19/2023 1104   KETONESUR NEGATIVE 12/31/2023 1453   PROTEINUR NEGATIVE 12/31/2023 1453   UROBILINOGEN 0.2 12/29/2023 0955   NITRITE NEGATIVE 12/31/2023 1453   LEUKOCYTESUR NEGATIVE 12/31/2023 1453   Sepsis Labs Recent Labs  Lab 01/15/24 1106 01/16/24 0544 01/18/24 0639  WBC 7.8 14.7* 11.0*   Microbiology Recent Results (from the past 240 hours)  Blood culture (routine x 2)     Status: None (Preliminary result)   Collection Time: 01/15/24 11:06 AM   Specimen: BLOOD LEFT WRIST  Result Value Ref Range Status   Specimen Description   Final    BLOOD LEFT WRIST Performed at [facility], [address], [place], [place] [address]    Special Requests   Final    BOTTLES DRAWN AEROBIC AND ANAEROBIC Blood Culture results may not be optimal due to an inadequate volume of blood received in culture bottles Performed at [facility], [address], [place], [place] [address]    Culture   Final    NO GROWTH 4 DAYS Performed at [facility], [address], [place], [place] [address]    Report Status PENDING  Incomplete  Blood culture (routine x 2)     Status: None (Preliminary result)   Collection Time: 01/15/24 11:20 AM   Specimen: BLOOD  Result Value Ref Range Status   Specimen Description   Final    BLOOD RIGHT ANTECUBITAL Performed at [facility], [address], [place], [place] [address]    Special Requests   Final    BOTTLES DRAWN AEROBIC AND ANAEROBIC Blood Culture results may not be optimal due to an inadequate volume of blood received in culture bottles Performed at [facility], [address] [place], [place] [address]    Culture   Final    NO GROWTH 4 DAYS Performed at [facility], [address] [place], [place] [address]    Report Status PENDING  Incomplete     Time coordinating discharge: 35 minutes  SIGNED:   Audria Leather, MD  [facility] 01/19/2024, 9:51 AM

## 2024-01-19 NOTE — Plan of Care (Signed)
  Problem: Education: Goal: Knowledge of General Education information will improve Description: Including pain rating scale, medication(s)/side effects and non-pharmacologic comfort measures Outcome: Progressing   Problem: Health Behavior/Discharge Planning: Goal: Ability to manage health-related needs will improve Outcome: Progressing   Problem: Clinical Measurements: Goal: Ability to maintain clinical measurements within normal limits will improve Outcome: Progressing Goal: Diagnostic test results will improve Outcome: Progressing Goal: Respiratory complications will improve Outcome: Progressing   Problem: Activity: Goal: Risk for activity intolerance will decrease Outcome: Progressing   Problem: Nutrition: Goal: Adequate nutrition will be maintained Outcome: Progressing   Problem: Pain Managment: Goal: General experience of comfort will improve and/or be controlled Outcome: Progressing

## 2024-01-19 NOTE — TOC Transition Note (Signed)
 Transition of Care [facility]) - Discharge Note   Patient Details  Name: Lori [name] MRN: [id number] Date of Birth: 04-May-1956  Transition of Care [facility]) CM/SW Contact:  Tessie Fila, RN Phone Number: 01/19/2024, 11:14 AM   Clinical Narrative:    Met with pt to set up Operating Room Services and DME. She has no preferences.  Pt inquired about resources for meal  assistance, and resources provided. HH PT/OT set up through [facility] and information updated to pt AVS. A four wheeled walker with seat ordered through [employer] and will be delivered to pt room. Pt to be transported at discharge home by family/friend. There are no other TOC needs at this time.    Final next level of care: Home w Home Health Services Barriers to Discharge: No Barriers Identified   Patient Goals and CMS Choice Patient states their goals for this hospitalization and ongoing recovery are:: To return home CMS Medicare.gov Compare Post Acute Care list provided to:: Other (Comment Required) Choice offered to / list presented to : NA [place] ownership interest in [facility].provided to:: Parent NA    Discharge Placement                       Discharge Plan and Services Additional resources added to the After Visit Summary for                  DME Arranged: Walker rolling with seat DME Agency: [employer] Date DME Agency Contacted: 01/19/24 Time DME Agency Contacted: 1113 Representative spoke with at DME Agency: Zack with [employer] HH Arranged: PT, OT HH Agency: [facility] Date [facility] Agency Contacted: 01/19/24 Time HH Agency Contacted: 1107 Representative spoke with at [facility] Agency: Cindie with [facility]  Social Drivers of Health (SDOH) Interventions SDOH Screenings   Food Insecurity: No Food Insecurity (01/16/2024)  Housing: Low Risk  (01/16/2024)  Transportation Needs: No Transportation Needs (01/16/2024)  Utilities: Not At Risk (01/16/2024)  Alcohol Screen: Low Risk  (11/12/2023)  Depression  (PHQ2-9): Medium Risk (01/07/2024)  Financial Resource Strain: Low Risk  (11/12/2023)  Physical Activity: Inactive (11/12/2023)  Social Connections: Moderately Isolated (01/16/2024)  Stress: Stress Concern Present (11/12/2023)  Tobacco Use: High Risk (01/16/2024)  Health Literacy: Adequate Health Literacy (11/12/2023)     Readmission Risk Interventions     No data to display

## 2024-01-19 NOTE — Progress Notes (Signed)
 Mobility Specialist - Progress Note   01/19/24 0848  Mobility  Activity Ambulated with assistance in hallway  Level of Assistance Contact guard assist, steadying assist  Assistive Device Front wheel walker  Distance Ambulated (ft) 76 ft  Range of Motion/Exercises Active  Activity Response Tolerated well  Mobility Referral Yes  Mobility visit 1 Mobility  Mobility Specialist Start Time (ACUTE ONLY) [id number]  Mobility Specialist Stop Time (ACUTE ONLY) 0848  Mobility Specialist Time Calculation (min) (ACUTE ONLY) 10 min   Pt was found in bed and agreeable to ambulate. Grew fatigued with session. At EOS returned to bed with all needs met. Call bell in reach.  Lorna Rose Mobility Specialist

## 2024-01-20 ENCOUNTER — Telehealth: Payer: Self-pay | Admitting: *Deleted

## 2024-01-20 LAB — CULTURE, BLOOD (ROUTINE X 2)
Culture: NO GROWTH
Culture: NO GROWTH

## 2024-01-20 NOTE — Transitions of Care (Post Inpatient/ED Visit) (Signed)
   01/20/2024  Name: DEBRIANNA GUIER MRN: [id number] DOB: 10/08/1955  Today's TOC FU Call Status: Today's TOC FU Call Status:: Unsuccessful Call (1st Attempt) Unsuccessful Call (1st Attempt) Date: 01/20/24  Attempted to reach the patient regarding the most recent Inpatient visit  Received automated outgoing voice message stating that "the person you are trying to call has a voice mail box that is full; please hang up and try your call again later;" unable to leave voice message requesting call back   Follow Up Plan: Additional outreach attempts will be made to reach the patient to complete the Transitions of Care (Post Inpatient visit) call.   Pls call/ message for questions,  Olga Bourbeau Mckinney Saki Legore, RN, BSN, CCRN Alumnus RN Care Manager  Transitions of Care  VBCI - [facility] [phone]: direct office

## 2024-01-21 ENCOUNTER — Telehealth: Payer: Self-pay | Admitting: *Deleted

## 2024-01-21 NOTE — Patient Instructions (Signed)
 Visit Information  Thank you for taking time to visit with me today. Please don't hesitate to contact me if I can be of assistance to you before our next scheduled telephone appointment.  Our next appointment is by telephone on Tuesday 01/27/24 at 2:00 pm  Please call the care guide team at [phone] if you need to cancel or reschedule your appointment.   Patient Self Care Activities:  Attend all scheduled provider appointments Call provider office for new concerns or questions  Participate in Transition of Care Program/Attend TOC scheduled calls eliminate smoking in my home develop a rescue plan keep follow-up appointments: PCP appointment 01/23/24 Continue pacing activity to avoid episodes of shortness of breath Use assistive devices (your new walker) to prevent falls If you believe your condition is getting worse- contact your care providers (doctors) promptly- reaching out to your doctor early when you have concerns can prevent you from having to go to the hospital Great job not smoking: please continue to NOT smoke- this is the single most important thing you can do to improve your health  Following is a copy of your care plan:   Goals Addressed             This Visit's Progress    VBCI Transitions of Care (TOC) Care Plan       Problems:  Recent Hospitalization for treatment of COPD Diet/Nutrition/Food Resources: prior food insecurity noted- denies true food insecurity during TOC call today- will continue to reassess: reports she is currently on wait list for meals on wheels, as of 01/21/24 Equipment/DME barrier:  was provided with new rolling walker at time of hospital discharge on 01/19/24- reports on 01/21/24- not using consistently- reports fall without injury on 01/21/24  Functional/Safety concern: high fall risk- reports 6-plus falls over last 12 months, all without injury per patient report  Home Health services barrier: ordered at time of hospital discharge through [facility]: care  coordination outreach with [facility] completed: they have processed referral and have attempted outreach to patient, all unsuccessful: patient has voice mail box that is full- will continue to follow up  Medication management barrier: reports manages medications independently- declines full review during TOC call today: agreeable for review at time of TOC-Program week # 2 outreach on 01/27/24  SDOH barrier: possible ongoing food resources; reports would like to discuss options around in-home care assistance in subsequent weeks- declines during initial TOC call 01/21/24  Multiple recent unplanned hospital admissions  Goal:  Over the next 30 days, the patient will not experience hospital readmission  Interventions:  Transitions of Care:  01/21/24- new goal Durable Medical Equipment (DME) needs assessed with patient/caregiver Doctor Visits  - discussed the importance of doctor visits Communication with PCP re: ongoing cough post-hospital discharge; falls post- hospital discharge; possible need for pulmonary referral Contacted provider for patient needs as above; notified of successful enrollment in [facility] 30-day program Arranged PCP follow-up within 7 days (Care Guide Scheduled) Contacted Health RN/OT/PT [facility]- as above for care coordination to facilitate initiation of services: will continue to follow up Post discharge activity limitations prescribed by provider reviewed Confirmed/ reinforced action plan for shortness of breath: verbalizes fair understanding/ adherence to same- confirmed taking newly prescribed medications; using rescue and maintenance inhalers as prescribed Confirmed patient report of not smoking since hospital discharge on 01/19/24: provided additional support and encouragement to continue NOT smoking and education around risks of smoking in setting of COPD- today, she declines need for additional resources: states was provided to  her in hospital; reports "last cigarette was at the end of  April before I went to the hospital" Provided education/ reinforcement around fall prevention: encouraged patient to start using her newly acquired walker: fall assessment completed  Patient Self Care Activities:  Attend all scheduled provider appointments Call provider office for new concerns or questions  Participate in Transition of Care Program/Attend TOC scheduled calls eliminate smoking in my home develop a rescue plan keep follow-up appointments: PCP appointment 01/23/24 Continue pacing activity to avoid episodes of shortness of breath Use assistive devices (your new walker) to prevent falls If you believe your condition is getting worse- contact your care providers (doctors) promptly- reaching out to your doctor early when you have concerns can prevent you from having to go to the hospital Great job not smoking: please continue to NOT smoke- this is the single most important thing you can do to improve your health  Plan:  Telephone follow up appointment with care management team member scheduled for:  Tuesday 01/27/24 as scheduled  Plan for next week's call: Review medications: full review Review medication adherence Review provider office visit 01/23/24- PCP/ labs/ medication changes if applicable Review upcoming provider office visits Review home health PT/ RN visits- confirm home health active; care coordination outreach to [facility] as needed Reinforce education re: self health management of COPD- use of inhalers; Follow up on smoking status: on 01/21/24- reported last cigarette was prior to hospital admission on 01/15/24 Explore need for LCSW referral re: need for in-home care assistance/ additional resources for food acquisition/ transportation          The patient verbalized understanding of instructions, educational materials, and care plan provided today and DECLINED offer to receive copy of patient instructions, educational materials, and care plan.   Telephone follow up  appointment with care management team member scheduled for: 01/27/24  If you are experiencing a Mental Health or Behavioral Health Crisis or need someone to talk to, please  call the Suicide and Crisis Lifeline: 988 call the [place]  National Suicide Prevention Lifeline: (938)137-7314 or TTY: 732 441 1443 TTY (857)734-0042) to talk to a trained counselor call 1-800-273-TALK (toll free, 24 hour hotline) go to [facility] [address] [place] [phone]) call the [facility] Crisis Line: [phone] call 911    Erlene Hawks, RN, BSN, CCRN Alumnus RN Care Manager  Transitions of Care  VBCI - Population Health  [place] [phone]: direct office

## 2024-01-21 NOTE — Transitions of Care (Post Inpatient/ED Visit) (Signed)
 01/21/2024  Name: Lori [name] MRN: [id number] DOB: 1955/10/28  Today's TOC FU Call Status: Today's TOC FU Call Status:: Successful TOC FU Call Completed TOC FU Call Complete Date: 01/21/24 Patient's Name and Date of Birth confirmed.  Transition Care Management Follow-up Telephone Call Date of Discharge: 01/19/24 Discharge Facility: [facility]) Type of Discharge: Inpatient Admission Primary Inpatient Discharge Diagnosis:: COPD exacerbation How have you been since you were released from the hospital?: Same ("I am still not good- coughing really bad; taking the new medicine like they told me to. I have not smoked since I got home.  I haven't heard from home health. I don't feel much like talking, it makes me cough too much.  I slipped and fell this morning") Any questions or concerns?: Yes Patient Questions/Concerns:: Ongoing post-hospital discharge coarse barking cough noted throughout [facility] call; has not yet heard from home health- patient has voice mail box that is full: confirmed with home health [facility] that they have attempted to call but can not leave patient message- they will re-attempt outreach this afternoon; patient reports fall without injury this morning- was not using her new walker Patient Questions/Concerns Addressed: Other:, Notified Provider of Patient Questions/Concerns (facilitated schedulung of earlier hospital follow up office visit with PCP covering provider- for 01/23/24 (was 02/03/24); care coordination outreach with [facility] completed)  Items Reviewed: Did you receive and understand the discharge instructions provided?: Yes (briefly reviewed with patient who verbalizes fair understanding of same - she does not wish to review in depth during TOC call today: states talking makes her cough more and she is not near her discharge AVS summary) Medications obtained,verified, and reconciled?: Partial Review Completed Reason for Partial Mediation Review: Patient  declined full medication review/ reconciliation: states talking on the phone just makes her cough more- she is agreeable to [facility] 30-day program enrollment and today, agrees to full review at time of next week TOC-P call on 01/27/24 Any new allergies since your discharge?: No Dietary orders reviewed?: Yes Type of Diet Ordered:: "As healthy as I can" Do you have support at home?: Yes People in Home [RPT]: alone Name of Support/Comfort Primary Source: Reports independent in self-care activities; resides alone; reports has "good friend" who assists as/ if needed/ indicated  Medications Reviewed Today: Medications Reviewed Today     Reviewed by Jimmey Hengel M, RN (Registered Nurse) on 01/21/24 at 1618  Med List Status: <None>   Medication Order Taking? Sig Documenting Provider Last Dose Status Informant  AIMOVIG 140 MG/ML SOAJ [id number]  Inject 140 mg into the skin every 30 (thirty) days. [provider]  Active Self  albuterol  (PROAIR  HFA) 108 (90 Base) MCG/ACT inhaler [id number] Yes Inhale 2 puffs into the lungs every 6 (six) hours as needed for wheezing or shortness of breath.  Patient taking differently: Inhale 2 puffs into the lungs in the morning and at bedtime.   Roslyn Coombe, MD Taking Active Self  ALPRAZolam  (XANAX ) 1 MG tablet [id number]  TAKE 1 TABLET BY MOUTH AT BEDTIME AS NEEDED FOR ANXIETY  Patient taking differently: Take 1 mg by mouth daily as needed for anxiety.   Roslyn Coombe, MD  Active Self  benzonatate  (TESSALON ) 100 MG capsule [id number] Yes Take 1 capsule (100 mg total) by mouth 3 (three) times daily. Audria Leather, MD Taking Active   budeson-glycopyrrolate -formoterol  (BREZTRI  AEROSPHERE) 160-9-4.8 MCG/ACT AERO inhaler [id number] Yes Inhale 2 puffs into the lungs 2 (two) times daily. Audria Leather, MD Taking Active  cetirizine  (ZYRTEC  ALLERGY) 10 MG tablet [id number]  take1 tablet by mouth once a day as needed for runny nose/drainage down throat  Patient taking  differently: Take 10 mg by mouth in the morning.   Tinnie Forehand, FNP  Active Self  dexlansoprazole  (DEXILANT ) 60 MG capsule [id number]  Take 1 capsule (60 mg total) by mouth daily before breakfast. Audria Leather, MD  Active   dextromethorphan -guaiFENesin  (MUCINEX  DM) 30-600 MG 12hr tablet [id number] Yes Take 1 tablet by mouth 2 (two) times daily. Audria Leather, MD Taking Active   EPINEPHrine  0.3 mg/0.3 mL IJ SOAJ injection [id number]  Inject 0.3 mg into the muscle as needed for anaphylaxis. Do not refrigerate. Rochester Chuck, MD  Active Self  FASENRA  30 MG/ML prefilled syringe [id number]  INJECT 30MG  SUBCUTANEOUSLY EVERY 8 WEEKS Rochester Chuck, MD  Active Self  FLUoxetine  (PROZAC ) 20 MG capsule [id number]  Take 1 capsule (20 mg total) by mouth daily. Roslyn Coombe, MD  Active Self  gabapentin  (NEURONTIN ) 600 MG tablet [id number]  Take 600 mg by mouth daily in the afternoon. [provider]  Active Self  meclizine  (ANTIVERT ) 12.5 MG tablet [id number]  TAKE 1 TABLET BY MOUTH THREE TIMES DAILY AS NEEDED FOR DIZZINESS John, James W, MD  Active Self  montelukast  (SINGULAIR ) 10 MG tablet [id number]  Take 1 tablet (10 mg total) by mouth at bedtime. Tinnie Forehand, FNP  Active Self  nitroGLYCERIN  (NITROSTAT ) 0.4 MG SL tablet [id number]  Place 1 tablet (0.4 mg total) under the tongue every 5 (five) minutes as needed for chest pain. Roslyn Coombe, MD  Active Self  ondansetron  (ZOFRAN ) 4 MG tablet [id number] Yes Take 1 tablet (4 mg total) by mouth every 8 (eight) hours as needed for nausea or vomiting. Audria Leather, MD Taking Active   predniSONE  (DELTASONE ) 20 MG tablet [id number] Yes Take 2 tablets (40 mg total) by mouth daily with breakfast for 7 days. Audria Leather, MD Taking Active   tiZANidine  (ZANAFLEX ) 2 MG tablet [id number]  Take 1 tablet (2 mg total) by mouth every 8 (eight) hours as needed for muscle spasms. Roslyn Coombe, MD  Active Self  traMADol  (ULTRAM ) 50 MG tablet [id number]   Take 1 tablet (50 mg total) by mouth every 6 (six) hours as needed.  Patient taking differently: Take 50 mg by mouth every 6 (six) hours as needed (for pain).   Roslyn Coombe, MD  Active Self  valACYclovir  (VALTREX ) 500 MG tablet [id number]  Take 1 tablet (500 mg total) by mouth 2 (two) times daily. Wellington Half, FNP  Active Self           Med Note Guido Leeks, [place] Leech Jan 15, 2024  9:13 PM) The patient said she takes this dose twice a day, every day  zolpidem  (AMBIEN ) 5 MG tablet [id number]  Take 1 tablet (5 mg total) by mouth at bedtime as needed for sleep. Roslyn Coombe, MD  Active Self           Home Care and Equipment/Supplies: Were Home Health Services Ordered?: Yes Name of Home Health Agency:: [facility] PT/ OT:  [phone] Has Agency set up a time to come to your home?: No (Care coordination outreach with [facility]: she reports referral has been processed; they have attempted to contact patient and will continue trying: patient's voice mail box is full) EMR reviewed for Home Health Orders: Orders present/patient has not received call (refer to CM for follow-up) Any new equipment  or medical supplies ordered?: Yes ([employer]- confirmed delivered to bedside prior to hospital discharge) Name of Medical supply agency?: [employer] Were you able to get the equipment/medical supplies?: Yes Do you have any questions related to the use of the equipment/supplies?: No  Functional Questionnaire: Do you need assistance with bathing/showering or dressing?: No Do you need assistance with meal preparation?: No Do you need assistance with eating?: No Do you have difficulty maintaining continence: No Do you need assistance with getting out of bed/getting out of a chair/moving?: No Do you have difficulty managing or taking your medications?: No  Follow up appointments reviewed: PCP Follow-up appointment confirmed?: Yes (care coordination outreach in real-time with scheduling care  guide to successfully schedule hospital follow up PCP appointment 01/23/24: earlier than prior appointment on 02/03/24) Date of PCP follow-up appointment?: 01/23/24 Follow-up Provider: PCP- covering provider Specialist Hospital Follow-up appointment confirmed?: NA (verified not indicated per hospital discharging provider discharge notes) Do you need transportation to your follow-up appointment?: No Do you understand care options if your condition(s) worsen?: Yes-patient verbalized understanding  SDOH Interventions Today    Flowsheet Row Most Recent Value  SDOH Interventions   Food Insecurity Interventions Other (Comment)  [Patient confirms during TOC call that she heard from [employer] today and is currently on the waiting list: today- she denies need for additional resources for food insecurity: will continue to assess]  Housing Interventions Intervention Not Indicated  Transportation Interventions Intervention Not Indicated  [reports she has a "good friend" who provides all of her transportation]  Utilities Interventions Intervention Not Indicated      See TOC assessment tabs for additional assessment/ TOC intervention information  Total time spent from review to signing of note/ including any care coordination interventions:  110 minutes  Pls call/ message for questions,  Cristhian Vanhook Mckinney Greogry Goodwyn, RN, BSN, Media planner  Transitions of Care  [facility]BCI - [facility] [phone]: direct office

## 2024-01-22 ENCOUNTER — Ambulatory Visit: Payer: Self-pay | Admitting: *Deleted

## 2024-01-22 DIAGNOSIS — J45901 Unspecified asthma with (acute) exacerbation: Secondary | ICD-10-CM | POA: Diagnosis not present

## 2024-01-22 DIAGNOSIS — G8929 Other chronic pain: Secondary | ICD-10-CM | POA: Diagnosis not present

## 2024-01-22 DIAGNOSIS — M79606 Pain in leg, unspecified: Secondary | ICD-10-CM | POA: Diagnosis not present

## 2024-01-22 DIAGNOSIS — J441 Chronic obstructive pulmonary disease with (acute) exacerbation: Secondary | ICD-10-CM | POA: Diagnosis not present

## 2024-01-22 DIAGNOSIS — M810 Age-related osteoporosis without current pathological fracture: Secondary | ICD-10-CM | POA: Diagnosis not present

## 2024-01-22 DIAGNOSIS — I700 Atherosclerosis of aorta: Secondary | ICD-10-CM | POA: Diagnosis not present

## 2024-01-22 DIAGNOSIS — J432 Centrilobular emphysema: Secondary | ICD-10-CM | POA: Diagnosis not present

## 2024-01-22 DIAGNOSIS — K219 Gastro-esophageal reflux disease without esophagitis: Secondary | ICD-10-CM | POA: Diagnosis not present

## 2024-01-22 DIAGNOSIS — J849 Interstitial pulmonary disease, unspecified: Secondary | ICD-10-CM | POA: Diagnosis not present

## 2024-01-22 DIAGNOSIS — E785 Hyperlipidemia, unspecified: Secondary | ICD-10-CM | POA: Diagnosis not present

## 2024-01-22 DIAGNOSIS — Z7951 Long term (current) use of inhaled steroids: Secondary | ICD-10-CM | POA: Diagnosis not present

## 2024-01-22 DIAGNOSIS — Z9181 History of falling: Secondary | ICD-10-CM | POA: Diagnosis not present

## 2024-01-22 DIAGNOSIS — Z7952 Long term (current) use of systemic steroids: Secondary | ICD-10-CM | POA: Diagnosis not present

## 2024-01-22 NOTE — Telephone Encounter (Signed)
 Copied from CRM [id number]. Topic: Clinical - Red Word Triage >> Jan 22, 2024  3:30 PM [place] wrote: Red Word that prompted transfer to Nurse Triage: Physical therapist was just with patient and said her blood pressure was measuring around 80-90.. he is not there anymore. He told her she needed to call and speak to a nurse Reason for Disposition  Brief (now gone) dizziness or lightheadedness after standing up or eating    Pt has a hospital follow up at 9:40 5/9 with Casimer Clear  Answer Assessment - Initial Assessment Questions 1. BLOOD PRESSURE: "What is the blood pressure?" "Did you take at least two measurements 5 minutes apart?"     The PT said my BP was really low.   He would not tell me what the number was.  He said I need to be on BP medication.   I just got out of the hospital.   It was low in the hospital too.   My heart rate was low too.   I'm just so tired.   My lungs are damaged.   I have bronchitis.    The  PT said I needed to call you but he did not tell me what my reading was.   He left.  That's why I'm calling you.   He told me to call.  I take medicine for dizziness 3 times a day.   I'm laying down now.    I'm already weak and dizzy.   I use a cane to go to the bathroom.    2. ONSET: "When did you take your blood pressure?"  He took it while he was here but did not tell me the reading.   He just said it was really low and that I needed to be on BP medicine and to call you.     The PT did therapy with me today.  I got a little dizzy during therapy.  I told him I needed to lay back down.   So I'm laying down.   I have COPD. 3. HOW: "How did you obtain the blood pressure?" (e.g., visiting nurse, automatic home BP monitor)     PT took it today during his visit. 4. HISTORY: "Do you have a history of low blood pressure?" "What is your blood pressure normally?"     Yes in the hospital.   I don't know what it is normally 5. MEDICINES: "Are you taking any medications for blood  pressure?" If Yes, ask: "Have they been changed recently?"     Not on medications for BP 6. PULSE RATE: "Do you know what your pulse rate is?"      The PT did not tell me what my heart rate was either. 7. OTHER SYMPTOMS: "Have you been sick recently?" "Have you had a recent injury?"     I was in the hospital recently. I have the shakes in my legs and hands real bad.   It's been like that since before I went to hospital.   8. PREGNANCY: "Is there any chance you are pregnant?" "When was your last menstrual period?"     N/A  Protocols used: Blood Pressure - Low-A-AH  Chief Complaint: Physical therapist told me to call.  He said my BP was low but did not tell me what the reading was or my heart rate.   He said I needed to be on BP medicine so that's why I'm calling.   He has already left.  Symptoms: She was just discharged from the hospital due to COPD.   She is c/o being really tired.   Has dizziness with ambulation.    She did therapy with PT today but got weak and dizzy so she told him she wanted to lay down so therapy ended.   "I take a pill for dizziness 3 times a day".   "I just took it and I'm laying down".    "I'm just really tired, mostly".     Pt has an after visit hospital follow up appt in the morning at 9:40 with Casimer Clear.   I emphasized the importance of coming to that appt in the morning.  I just want to go lay down for now so I will see y'all in the morning. Frequency: Now Pertinent Negatives: Patient denies passing out.   Just feeling tired. Disposition: [] ED /[] Urgent Care (no appt availability in office) / [x] Appointment(In office/virtual)/ []  [facility]/ [] Home Care/ [] Refused Recommended Disposition /[] [facility]/ []  Follow-up with PCP Additional Notes: Already has an appt for 9:40 on 01/23/2024 with Casimer Clear,   I instructed pt that if she got any weaker or more dizzy to go to the ED.

## 2024-01-23 ENCOUNTER — Encounter: Payer: Self-pay | Admitting: Family Medicine

## 2024-01-23 ENCOUNTER — Telehealth: Payer: Self-pay

## 2024-01-23 ENCOUNTER — Other Ambulatory Visit: Payer: Self-pay

## 2024-01-23 ENCOUNTER — Encounter (HOSPITAL_COMMUNITY): Payer: Self-pay

## 2024-01-23 ENCOUNTER — Emergency Department (HOSPITAL_COMMUNITY)
Admission: EM | Admit: 2024-01-23 | Discharge: 2024-01-24 | Disposition: A | Discharge status: Home / Self Care | Attending: Emergency Medicine | Admitting: Emergency Medicine

## 2024-01-23 ENCOUNTER — Ambulatory Visit: Admitting: Family Medicine

## 2024-01-23 VITALS — BP 140/82 | HR 74 | Temp 98.3°F | Ht 64.0 in | Wt 118.0 lb

## 2024-01-23 DIAGNOSIS — E872 Acidosis, unspecified: Secondary | ICD-10-CM | POA: Diagnosis not present

## 2024-01-23 DIAGNOSIS — J441 Chronic obstructive pulmonary disease with (acute) exacerbation: Secondary | ICD-10-CM | POA: Insufficient documentation

## 2024-01-23 DIAGNOSIS — R062 Wheezing: Secondary | ICD-10-CM | POA: Diagnosis present

## 2024-01-23 DIAGNOSIS — D72829 Elevated white blood cell count, unspecified: Secondary | ICD-10-CM | POA: Insufficient documentation

## 2024-01-23 DIAGNOSIS — J45909 Unspecified asthma, uncomplicated: Secondary | ICD-10-CM | POA: Diagnosis not present

## 2024-01-23 DIAGNOSIS — Z7951 Long term (current) use of inhaled steroids: Secondary | ICD-10-CM | POA: Insufficient documentation

## 2024-01-23 DIAGNOSIS — J449 Chronic obstructive pulmonary disease, unspecified: Secondary | ICD-10-CM | POA: Diagnosis not present

## 2024-01-23 DIAGNOSIS — T887XXA Unspecified adverse effect of drug or medicament, initial encounter: Secondary | ICD-10-CM | POA: Diagnosis not present

## 2024-01-23 DIAGNOSIS — T380X5A Adverse effect of glucocorticoids and synthetic analogues, initial encounter: Secondary | ICD-10-CM | POA: Diagnosis not present

## 2024-01-23 DIAGNOSIS — J90 Pleural effusion, not elsewhere classified: Secondary | ICD-10-CM | POA: Diagnosis not present

## 2024-01-23 DIAGNOSIS — Z720 Tobacco use: Secondary | ICD-10-CM

## 2024-01-23 DIAGNOSIS — Z91040 Latex allergy status: Secondary | ICD-10-CM | POA: Diagnosis not present

## 2024-01-23 DIAGNOSIS — D72828 Other elevated white blood cell count: Secondary | ICD-10-CM | POA: Diagnosis not present

## 2024-01-23 DIAGNOSIS — J439 Emphysema, unspecified: Secondary | ICD-10-CM

## 2024-01-23 LAB — COMPREHENSIVE METABOLIC PANEL WITH GFR
ALT: 21 U/L (ref 0–35)
ALT: 23 U/L (ref 0–44)
AST: 11 U/L (ref 0–37)
AST: 14 U/L — ABNORMAL LOW (ref 15–41)
Albumin: 4 g/dL (ref 3.5–5.2)
Albumin: 3.8 g/dL (ref 3.5–5.0)
Alkaline Phosphatase: 62 U/L (ref 39–117)
Alkaline Phosphatase: 63 U/L (ref 38–126)
Anion gap: 9 (ref 5–15)
BUN: 31 mg/dL — ABNORMAL HIGH (ref 6–23)
BUN: 31 mg/dL — ABNORMAL HIGH (ref 8–23)
CO2: 30 meq/L (ref 19–32)
CO2: 26 mmol/L (ref 22–32)
Calcium: 8.8 mg/dL (ref 8.4–10.5)
Calcium: 8.6 mg/dL — ABNORMAL LOW (ref 8.9–10.3)
Chloride: 97 meq/L (ref 96–112)
Chloride: 98 mmol/L (ref 98–111)
Creatinine, Ser: 1.01 mg/dL (ref 0.40–1.20)
Creatinine, Ser: 1.12 mg/dL — ABNORMAL HIGH (ref 0.44–1.00)
GFR, Estimated: 54 mL/min — ABNORMAL LOW (ref >60)
GFR: 57.63 mL/min — ABNORMAL LOW (ref >60.00)
Glucose, Bld: 77 mg/dL (ref 70–99)
Glucose, Bld: 100 mg/dL — ABNORMAL HIGH (ref 70–99)
Potassium: 4.2 meq/L (ref 3.5–5.1)
Potassium: 4.8 mmol/L (ref 3.5–5.1)
Sodium: 134 meq/L — ABNORMAL LOW (ref 135–145)
Sodium: 133 mmol/L — ABNORMAL LOW (ref 135–145)
Total Bilirubin: 0.4 mg/dL (ref 0.2–1.2)
Total Bilirubin: 0.2 mg/dL (ref 0.0–1.2)
Total Protein: 6.1 g/dL (ref 6.0–8.3)
Total Protein: 6.5 g/dL (ref 6.5–8.1)

## 2024-01-23 LAB — CBC WITH DIFFERENTIAL/PLATELET
Abs Immature Granulocytes: 0 10*3/uL (ref 0.00–0.07)
Basophils Absolute: 0 10*3/uL (ref 0.0–0.1)
Basophils Absolute: 0.2 10*3/uL — ABNORMAL HIGH (ref 0.0–0.1)
Basophils Relative: 0.1 % (ref 0.0–3.0)
Basophils Relative: 1 %
Eosinophils Absolute: 0 10*3/uL (ref 0.0–0.7)
Eosinophils Absolute: 0 10*3/uL (ref 0.0–0.5)
Eosinophils Relative: 0 % (ref 0.0–5.0)
Eosinophils Relative: 0 %
HCT: 41.8 % (ref 36.0–46.0)
HCT: 40 % (ref 36.0–46.0)
Hemoglobin: 13.8 g/dL (ref 12.0–15.0)
Hemoglobin: 13.7 g/dL (ref 12.0–15.0)
Lymphocytes Relative: 27.6 % (ref 12.0–46.0)
Lymphocytes Relative: 40 %
Lymphs Abs: 5.5 10*3/uL — ABNORMAL HIGH (ref 0.7–4.0)
Lymphs Abs: 7.4 10*3/uL — ABNORMAL HIGH (ref 0.7–4.0)
MCH: 31.3 pg (ref 26.0–34.0)
MCHC: 33 g/dL (ref 30.0–36.0)
MCHC: 34.3 g/dL (ref 30.0–36.0)
MCV: 92 fl (ref 78.0–100.0)
MCV: 91.3 fL (ref 80.0–100.0)
Monocytes Absolute: 1.6 10*3/uL — ABNORMAL HIGH (ref 0.1–1.0)
Monocytes Absolute: 1.3 10*3/uL — ABNORMAL HIGH (ref 0.1–1.0)
Monocytes Relative: 8 % (ref 3.0–12.0)
Monocytes Relative: 7 %
Neutro Abs: 12.7 10*3/uL — ABNORMAL HIGH (ref 1.4–7.7)
Neutro Abs: 9.7 10*3/uL — ABNORMAL HIGH (ref 1.7–7.7)
Neutrophils Relative %: 64.3 % (ref 43.0–77.0)
Neutrophils Relative %: 52 %
Platelets: 384 10*3/uL (ref 150.0–400.0)
Platelets: 360 10*3/uL (ref 150–400)
RBC: 4.54 Mil/uL (ref 3.87–5.11)
RBC: 4.38 MIL/uL (ref 3.87–5.11)
RDW: 14.2 % (ref 11.5–15.5)
RDW: 13.2 % (ref 11.5–15.5)
WBC: 19.8 10*3/uL (ref 4.0–10.5)
WBC: 18.6 10*3/uL — ABNORMAL HIGH (ref 4.0–10.5)
nRBC: 0 % (ref 0.0–0.2)

## 2024-01-23 LAB — I-STAT CG4 LACTIC ACID, ED
Lactic Acid, Venous: 1 mmol/L (ref 0.5–1.9)
Lactic Acid, Venous: 1 mmol/L (ref 0.5–1.9)

## 2024-01-23 MED ORDER — AMOXICILLIN-POT CLAVULANATE 875-125 MG PO TABS: 1.0000 | ORAL_TABLET | Freq: Two times a day (BID) | ORAL | 0 refills | Status: AC

## 2024-01-23 MED ORDER — BENZONATATE 100 MG PO CAPS: 100.0000 mg | ORAL_CAPSULE | Freq: Three times a day (TID) | ORAL | 1 refills | Status: DC

## 2024-01-23 NOTE — Assessment & Plan Note (Addendum)
 Augmentin  twice daily x 10 days, continue steroid course, refill Tessalon  Perles

## 2024-01-23 NOTE — Telephone Encounter (Signed)
 CRITICAL VALUE STICKER  CRITICAL VALUE: WBC level at 19.8  RECEIVER (on-site recipient of call): Carlon Chester  DATE & TIME NOTIFIED: 01/23/24 @ 12:22 pm  MESSENGER (representative from lab): [email or web address]  MD NOTIFIED: Yes  TIME OF NOTIFICATION: 12:24 pm

## 2024-01-23 NOTE — Progress Notes (Signed)
 Acute Office Visit  Subjective:     Patient ID: Lori [name], female    DOB: [birth year], 68 y.o.   MRN: [id number]  Chief Complaint  Patient presents with   Hospitalization Follow-up    She was COPD. She is still tired.    HPI Follow up Hospitalization  Patient was admitted to [facility] on 01/15/24 and discharged on 01/18/24. She was treated for COPD exacerbation. Treatment for this included high dose IV steroids, zithromax , nebulizers, singulair , with outpatient prednisone  course of 40mg  once daily x 7 days. Telephone follow up was done on 01/21/24 She reports good compliance with treatment. She reports this condition is not improved. Per triage note yesterday, BP was low per PT. States that she is weak, dizzy, reports decreased appetite given illness of coughing. Reports that she is still coughing up some very dark-colored sputum, and it has not improved. She is accompanied by her friend today who is also acting as partial historian. Friend states that patient is still smoking. Denies blood pressure issues today, denies dizziness today.  States she was very tired whenever they checked blood pressures yesterday. Requesting refill of Tessalon  Perles today as well. Medical history as outlined below   ----------------------------------------------------------------------------------------- -   ROS Per HPI      Objective:    BP (!) 140/82 (BP Location: Left Arm, Patient Position: Sitting)   Pulse 74   Temp 98.3 F (36.8 C) (Temporal)   Ht 5\' 4"  (1.626 m)   Wt 118 lb (53.5 kg)   SpO2 94%   BMI 20.25 kg/m    Physical Exam Vitals and nursing note reviewed.  Constitutional:      General: She is not in acute distress.    Comments: Chronically ill-appearing, appears fatigued  HENT:     Head: Normocephalic and atraumatic.     Right Ear: External ear normal.     Left Ear: External ear normal.     Nose: Nose normal.     Mouth/Throat:     Mouth: Mucous membranes  are moist.     Pharynx: Oropharynx is clear.     Comments: Oropharyngeal cobblestoning   Eyes:     Extraocular Movements: Extraocular movements intact.  Cardiovascular:     Rate and Rhythm: Normal rate and regular rhythm.     Pulses: Normal pulses.     Heart sounds: Normal heart sounds.  Pulmonary:     Effort: Pulmonary effort is normal. No respiratory distress.     Breath sounds: Wheezing and rhonchi present. No rales.  Musculoskeletal:     Cervical back: Normal range of motion.     Right lower leg: No edema.     Left lower leg: No edema.     Comments: Using single-point cane  Lymphadenopathy:     Cervical: No cervical adenopathy.  Neurological:     General: No focal deficit present.     Mental Status: She is alert and oriented to person, place, and time.  Psychiatric:        Mood and Affect: Mood normal.        Thought Content: Thought content normal.    No results found for any visits on 01/23/24.      Assessment & Plan:   COPD exacerbation (HCC) Assessment & Plan: Augmentin  twice daily x 10 days, continue steroid course, refill Tessalon  Perles  Orders: -     CBC with Differential/Platelet -     Comprehensive metabolic panel with GFR -  Lactic acid, plasma -     Benzonatate ; Take 1 capsule (100 mg total) by mouth 3 (three) times daily.  Dispense: 90 capsule; Refill: 1 -     Amoxicillin -Pot Clavulanate; Take 1 tablet by mouth 2 (two) times daily for 7 days.  Dispense: 14 tablet; Refill: 0  Other elevated white blood cell (WBC) count Assessment & Plan: CBC today  Orders: -     CBC with Differential/Platelet  Lactic acidemia Assessment & Plan: Lactic acid today  Orders: -     Lactic acid, plasma  Tobacco abuse Assessment & Plan: 4 minutes discussing smoking cessation and how I can help improve her breathing even though she has been smoking for a long time, can decrease COPD exacerbations   Pulmonary emphysema, unspecified emphysema type  (HCC) Assessment & Plan: Progressive, discussed smoking cessation and how this can improve her breathing      Meds ordered this encounter  Medications   benzonatate  (TESSALON ) 100 MG capsule    Sig: Take 1 capsule (100 mg total) by mouth 3 (three) times daily.    Dispense:  90 capsule    Refill:  1   amoxicillin -clavulanate (AUGMENTIN ) 875-125 MG tablet    Sig: Take 1 tablet by mouth 2 (two) times daily for 7 days.    Dispense:  14 tablet    Refill:  0    Return for As scheduled with Dr. Autry Legions, sooner if needed.  Wellington Half, FNP

## 2024-01-23 NOTE — Assessment & Plan Note (Signed)
 Lactic acid today

## 2024-01-23 NOTE — ED Notes (Signed)
 Pt in restroom

## 2024-01-23 NOTE — Assessment & Plan Note (Signed)
 CBC today.

## 2024-01-23 NOTE — Patient Instructions (Signed)
 I have sent in Augmentin  for you to take twice a day for 10 days.  This medication can upset your stomach, so I tell everyone to take it with a meal.  I have sent in tessalon  perles for you to use three times per day as needed.  We are checking labs today, will be in contact with any results that require further attention.  Keep your appointment with Dr Autry Legions as scheduled, sooner if needed.

## 2024-01-23 NOTE — Assessment & Plan Note (Signed)
 Progressive, discussed smoking cessation and how this can improve her breathing

## 2024-01-23 NOTE — ED Triage Notes (Signed)
 Pt had COPD flare up and was admitted to hospital, discharged Monday. Saw PCP today and WBC count was 19, they sent pt to ER for concern for sepsis. No fever, chills, body aches. C/o fatigue and chest tightness

## 2024-01-23 NOTE — Assessment & Plan Note (Signed)
 4 minutes discussing smoking cessation and how I can help improve her breathing even though she has been smoking for a long time, can decrease COPD exacerbations

## 2024-01-24 ENCOUNTER — Emergency Department (HOSPITAL_COMMUNITY)

## 2024-01-24 DIAGNOSIS — J441 Chronic obstructive pulmonary disease with (acute) exacerbation: Secondary | ICD-10-CM | POA: Diagnosis not present

## 2024-01-24 DIAGNOSIS — J90 Pleural effusion, not elsewhere classified: Secondary | ICD-10-CM | POA: Diagnosis not present

## 2024-01-24 DIAGNOSIS — J449 Chronic obstructive pulmonary disease, unspecified: Secondary | ICD-10-CM | POA: Diagnosis not present

## 2024-01-24 MED ORDER — ALBUTEROL SULFATE (2.5 MG/3ML) 0.083% IN NEBU
5.0000 mg | INHALATION_SOLUTION | Freq: Once | RESPIRATORY_TRACT | Status: DC
  Filled 2024-01-24: qty 6

## 2024-01-24 MED ORDER — IPRATROPIUM-ALBUTEROL 0.5-2.5 (3) MG/3ML IN SOLN
3.0000 mL | Freq: Once | RESPIRATORY_TRACT | Status: AC
  Administered 2024-01-24: 3 mL via RESPIRATORY_TRACT
  Filled 2024-01-24: qty 3

## 2024-01-24 NOTE — Discharge Instructions (Signed)
 Recheck CBC in 5 days.  Return for fever or shortness of breath or weakness.

## 2024-01-24 NOTE — ED Provider Notes (Signed)
 [name] [facility] AT [facility] Provider Note   CSN: [id number] Arrival date & time: 01/23/24  1920     History  Chief Complaint  Patient presents with   Abnormal Labs    Lori [name] is a 68 y.o. female.  The history is provided by the patient.  Illness Location:  At doctors office Quality:  Patient who has been on high dose steroids presents with elevated white count as nurse practicioner told patient to come to the ED Severity:  Moderate Onset quality:  Sudden Duration:  1 day Timing:  Constant Progression:  Unchanged Context:  No fevers, no weakness, no palpitations, no tachycardia no hypotension Relieved by:  Nothing Worsened by:  Nothing Ineffective treatments:  None Associated symptoms: wheezing   Associated symptoms: no abdominal pain, no chest pain, no cough, no fatigue, no fever and no shortness of breath   Patient with COPD recently discharged from the hospital on steroids presents having been told she has a high white count after post hospital follow up and labs.  Is wheezing but is completely asymptomatic.      Past Medical History:  Diagnosis Date   Allergic rhinitis    Anemia    hgb 13.9 on 11/22/21   Ankle fracture 05/2016   Anxiety    Arthritis    Asthma    Barrett esophagus 2007   Chest pain    Hx, no current problems as of 11/22/21.  Recurrent episodes.  Has had 3 negative nuclear stress test, and a completely normal coronary CT angiogram   COPD (chronic obstructive pulmonary disease) with chronic bronchitis (HCC)    & Emphysema   Depression    Duodenitis without mention of hemorrhage 2007   Esophageal reflux 2007   Esophageal stricture    Full dentures    patient does not wear dentures as of 11/22/21   H/O hiatal hernia    Hiatal hernia 2006,2007   HSV infection    Hyperlipidemia    rx crestor  but pt not taking   Multiple fractures    from falls, fx rt. elbow, fx left wrist, bilateral ankles   Pneumonia 10/2016    Restrictive lung disease 06/30/2017   Ulcer    no current problems as of 11/22/21     Home Medications Prior to Admission medications   Medication Sig Start Date End Date Taking? Authorizing Provider  AIMOVIG 140 MG/ML SOAJ Inject 140 mg into the skin every 30 (thirty) days.    [provider]  albuterol  (PROAIR  HFA) 108 (90 Base) MCG/ACT inhaler Inhale 2 puffs into the lungs every 6 (six) hours as needed for wheezing or shortness of breath. Patient taking differently: Inhale 2 puffs into the lungs in the morning and at bedtime. 12/27/22   Roslyn Coombe, MD  ALPRAZolam  (XANAX ) 1 MG tablet TAKE 1 TABLET BY MOUTH AT BEDTIME AS NEEDED FOR ANXIETY Patient taking differently: Take 1 mg by mouth daily as needed for anxiety. 11/19/23   Roslyn Coombe, MD  amoxicillin -clavulanate (AUGMENTIN ) 875-125 MG tablet Take 1 tablet by mouth 2 (two) times daily for 7 days. 01/23/24 01/30/24  Wellington Half, FNP  benzonatate  (TESSALON ) 100 MG capsule Take 1 capsule (100 mg total) by mouth 3 (three) times daily. 01/23/24   Wellington Half, FNP  budeson-glycopyrrolate -formoterol  (BREZTRI  AEROSPHERE) 160-9-4.8 MCG/ACT AERO inhaler Inhale 2 puffs into the lungs 2 (two) times daily. 01/19/24   Audria Leather, MD  cetirizine  (ZYRTEC  ALLERGY) 10 MG tablet take1  tablet by mouth once a day as needed for runny nose/drainage down throat Patient taking differently: Take 10 mg by mouth in the morning. 11/04/23   Tinnie Forehand, FNP  dexlansoprazole  (DEXILANT ) 60 MG capsule Take 1 capsule (60 mg total) by mouth daily before breakfast. 01/19/24   Audria Leather, MD  dextromethorphan -guaiFENesin  (MUCINEX  DM) 30-600 MG 12hr tablet Take 1 tablet by mouth 2 (two) times daily. 01/19/24   Audria Leather, MD  EPINEPHrine  0.3 mg/0.3 mL IJ SOAJ injection Inject 0.3 mg into the muscle as needed for anaphylaxis. Do not refrigerate. 08/04/23   Rochester Chuck, MD  FASENRA  30 MG/ML prefilled syringe INJECT 30MG  SUBCUTANEOUSLY  EVERY 8 WEEKS 09/21/23   Rochester Chuck, MD  FLUoxetine  (PROZAC ) 20 MG capsule Take 1 capsule (20 mg total) by mouth daily. 06/30/23 06/29/24  Roslyn Coombe, MD  gabapentin  (NEURONTIN ) 600 MG tablet Take 600 mg by mouth daily in the afternoon. 04/30/22   [provider]  meclizine  (ANTIVERT ) 12.5 MG tablet TAKE 1 TABLET BY MOUTH THREE TIMES DAILY AS NEEDED FOR DIZZINESS 01/13/24   Roslyn Coombe, MD  montelukast  (SINGULAIR ) 10 MG tablet Take 1 tablet (10 mg total) by mouth at bedtime. 11/04/23   Tinnie Forehand, FNP  nitroGLYCERIN  (NITROSTAT ) 0.4 MG SL tablet Place 1 tablet (0.4 mg total) under the tongue every 5 (five) minutes as needed for chest pain. 06/06/23   Roslyn Coombe, MD  ondansetron  (ZOFRAN ) 4 MG tablet Take 1 tablet (4 mg total) by mouth every 8 (eight) hours as needed for nausea or vomiting. 01/19/24   Audria Leather, MD  predniSONE  (DELTASONE ) 20 MG tablet Take 2 tablets (40 mg total) by mouth daily with breakfast for 7 days. 01/20/24 01/27/24  Audria Leather, MD  tiZANidine  (ZANAFLEX ) 2 MG tablet Take 1 tablet (2 mg total) by mouth every 8 (eight) hours as needed for muscle spasms. 12/02/23   Roslyn Coombe, MD  traMADol  (ULTRAM ) 50 MG tablet Take 1 tablet (50 mg total) by mouth every 6 (six) hours as needed. Patient taking differently: Take 50 mg by mouth every 6 (six) hours as needed (for pain). 12/29/23   Roslyn Coombe, MD  valACYclovir  (VALTREX ) 500 MG tablet Take 1 tablet (500 mg total) by mouth 2 (two) times daily. 09/19/23   Wellington Half, FNP  zolpidem  (AMBIEN ) 5 MG tablet Take 1 tablet (5 mg total) by mouth at bedtime as needed for sleep. 12/10/23   Roslyn Coombe, MD      Allergies    Advil  [ibuprofen ], Bee venom, Codeine , Clonidine derivatives, Doxycycline , Other, Statins, Latex, Levaquin  [levofloxacin ], and Propoxyphene n-acetaminophen     Review of Systems   Review of Systems  Constitutional:  Negative for chills, diaphoresis, fatigue and fever.  HENT:  Negative  for facial swelling.   Eyes:  Negative for photophobia.  Respiratory:  Positive for wheezing. Negative for cough and shortness of breath.   Cardiovascular:  Negative for chest pain.  Gastrointestinal:  Negative for abdominal pain.  Genitourinary:  Negative for dysuria.  All other systems reviewed and are negative.   Physical Exam Updated Vital Signs BP 113/64 (BP Location: Left Arm)   Pulse 63   Temp 97.7 F (36.5 C) (Oral)   Resp 16   Ht 5\' 4"  (1.626 m)   Wt 52.2 kg   SpO2 97%   BMI 19.74 kg/m  Physical Exam Vitals and nursing note reviewed.  Constitutional:      General: She is not in  acute distress.    Appearance: Normal appearance. She is well-developed.  HENT:     Head: Normocephalic and atraumatic.     Nose: Nose normal.  Eyes:     Pupils: Pupils are equal, round, and reactive to light.  Cardiovascular:     Rate and Rhythm: Normal rate and regular rhythm.     Pulses: Normal pulses.     Heart sounds: Normal heart sounds.  Pulmonary:     Effort: Pulmonary effort is normal. No respiratory distress.     Breath sounds: No stridor. Wheezing present. No rhonchi or rales.  Chest:     Chest wall: No tenderness.  Abdominal:     General: Bowel sounds are normal. There is no distension.     Palpations: Abdomen is soft.     Tenderness: There is no abdominal tenderness. There is no guarding or rebound.  Musculoskeletal:        General: Normal range of motion.     Cervical back: Normal range of motion and neck supple.  Skin:    General: Skin is warm and dry.     Capillary Refill: Capillary refill takes less than 2 seconds.     Findings: No erythema or rash.  Neurological:     General: No focal deficit present.     Mental Status: She is alert.     Deep Tendon Reflexes: Reflexes normal.  Psychiatric:        Mood and Affect: Mood normal.     ED Results / Procedures / Treatments   Labs (all labs ordered are listed, but only abnormal results are displayed) Results for  orders placed or performed during the hospital encounter of 01/23/24  Comprehensive metabolic panel   Collection Time: 01/23/24  7:35 PM  Result Value Ref Range   Sodium 133 (L) 135 - 145 mmol/L   Potassium 4.8 3.5 - 5.1 mmol/L   Chloride 98 98 - 111 mmol/L   CO2 26 22 - 32 mmol/L   Glucose, Bld 100 (H) 70 - 99 mg/dL   BUN 31 (H) 8 - 23 mg/dL   Creatinine, Ser 8.54 (H) 0.44 - 1.00 mg/dL   Calcium  8.6 (L) 8.9 - 10.3 mg/dL   Total Protein 6.5 6.5 - 8.1 g/dL   Albumin 3.8 3.5 - 5.0 g/dL   AST 14 (L) 15 - 41 U/L   ALT 23 0 - 44 U/L   Alkaline Phosphatase 63 38 - 126 U/L   Total Bilirubin 0.2 0.0 - 1.2 mg/dL   GFR, Estimated 54 (L) >60 mL/min   Anion gap 9 5 - 15  CBC with Differential   Collection Time: 01/23/24  7:35 PM  Result Value Ref Range   WBC 18.6 (H) 4.0 - 10.5 K/uL   RBC 4.38 3.87 - 5.11 MIL/uL   Hemoglobin 13.7 12.0 - 15.0 g/dL   HCT 62.7 03.5 - 00.9 %   MCV 91.3 80.0 - 100.0 fL   MCH 31.3 26.0 - 34.0 pg   MCHC 34.3 30.0 - 36.0 g/dL   RDW 38.1 82.9 - 93.7 %   Platelets 360 150 - 400 K/uL   nRBC 0.0 0.0 - 0.2 %   Neutrophils Relative % 52 %   Neutro Abs 9.7 (H) 1.7 - 7.7 K/uL   Lymphocytes Relative 40 %   Lymphs Abs 7.4 (H) 0.7 - 4.0 K/uL   Monocytes Relative 7 %   Monocytes Absolute 1.3 (H) 0.1 - 1.0 K/uL   Eosinophils Relative 0 %  Eosinophils Absolute 0.0 0.0 - 0.5 K/uL   Basophils Relative 1 %   Basophils Absolute 0.2 (H) 0.0 - 0.1 K/uL   Abs Immature Granulocytes 0.00 0.00 - 0.07 K/uL   Reactive, Benign Lymphocytes PRESENT   I-Stat Lactic Acid, ED   Collection Time: 01/23/24  7:57 PM  Result Value Ref Range   Lactic Acid, Venous 1.0 0.5 - 1.9 mmol/L  I-Stat Lactic Acid, ED   Collection Time: 01/23/24 11:38 PM  Result Value Ref Range   Lactic Acid, Venous 1.0 0.5 - 1.9 mmol/L   *Note: Due to a large number of results and/or encounters for the requested time period, some results have not been displayed. A complete set of results can be found in Results  Review.   DG Chest Portable 1 View Result Date: 01/24/2024 CLINICAL DATA:  COPD EXAM: PORTABLE CHEST 1 VIEW COMPARISON:  01/15/2024 FINDINGS: Small left pleural effusion. No consolidation or edema. Normal heart size and stable mediastinal contours. Apical lucency compatible with history of COPD. IMPRESSION: Small left pleural effusion.  No edema or visible pneumonia. Electronically Signed   By: Ronnette Coke M.D.   On: 01/24/2024 05:43   CT Angio Chest Pulmonary Embolism (PE) W or WO Contrast Result Date: 01/15/2024 CLINICAL DATA:  Pulmonary embolism (PE) suspected, high prob, tremors, central chest pain EXAM: CT ANGIOGRAPHY CHEST WITH CONTRAST TECHNIQUE: Multidetector CT imaging of the chest was performed using the standard protocol during bolus administration of intravenous contrast. Multiplanar CT image reconstructions and MIPs were obtained to evaluate the vascular anatomy. RADIATION DOSE REDUCTION: This exam was performed according to the departmental dose-optimization program which includes automated exposure control, adjustment of the mA and/or kV according to patient size and/or use of iterative reconstruction technique. CONTRAST:  75mL OMNIPAQUE  IOHEXOL  350 MG/ML SOLN COMPARISON:  May 14, 2023, February 16, 2017 FINDINGS: Pulmonary Embolism: No pulmonary embolism. Cardiovascular: No cardiomegaly or pericardial effusion. No aortic aneurysm. Scattered aortic and coronary artery atherosclerosis. Mediastinum/Nodes: No mediastinal mass. No mediastinal, hilar, or axillary lymphadenopathy. Lungs/Pleura: The trachea is midline and patent. Biapical pleuroparenchymal scarring with bullous change along the medial right lung apex. Mild diffuse bronchial wall thickening with areas of mucous plugging in both lower lobes. Moderate centrilobular emphysema. No focal airspace consolidation, pleural effusion, or pneumothorax. Unchanged 3 mm subpleural nodule in the left upper lobe (axial 21). Musculoskeletal: No acute  fracture or destructive bone lesion. Chronic, healed left anterior rib fractures. Osteopenia. Multilevel degenerative disc disease of the spine. Upper Abdomen: No acute abnormality in the partially visualized upper abdomen. Cholecystectomy. Review of the MIP images confirms the above findings. IMPRESSION: 1. No pulmonary embolism. 2. Moderate centrilobular emphysema. Diffuse bronchial wall thickening with areas of mucous plugging in both lower lobes, may reflect changes of acute or chronic bronchitis. No superimposed pneumonia, pleural effusion, or pulmonary edema. Aortic Atherosclerosis (ICD10-I70.0) and Emphysema (ICD10-J43.9). Electronically Signed   By: Rance Burrows M.D.   On: 01/15/2024 15:02   DG Chest Port 1 View Result Date: 01/15/2024 CLINICAL DATA:  Shortness of breath and chest pain. EXAM: PORTABLE CHEST 1 VIEW COMPARISON:  09/30/2023 FINDINGS: Lungs appear hyperexpanded. The lungs are clear without focal pneumonia, edema, pneumothorax or pleural effusion. The cardiopericardial silhouette is within normal limits for size. Nonacute left rib fractures evident. Telemetry leads overlie the chest. IMPRESSION: Hyperexpansion without acute cardiopulmonary findings. Electronically Signed   By: Donnal Fusi M.D.   On: 01/15/2024 12:30   DG Lumbar Spine Complete Result Date: 01/13/2024 CLINICAL DATA:  Acute  on chronic low back pain after 2 falls. EXAM: LUMBAR SPINE - COMPLETE 4+ VIEW COMPARISON:  Lumbar radiograph 04/30/2023 FINDINGS: The bones are subjectively under mineralized. 5 non-rib-bearing lumbar vertebra with diminutive ribs at T12. Normal lumbar alignment. No evidence of acute fracture compression deformity. Lower lumbar facet hypertrophy is unchanged from prior exam. Minor anterior spurring at multiple levels, slight L1-L2 and L2-L3 disc space narrowing. No sacroiliac diastasis. IMPRESSION: 1. No acute fracture or subluxation of the lumbar spine. 2. Mild degenerative disc disease and lower  lumbar facet hypertrophy. Electronically Signed   By: Chadwick Colonel M.D.   On: 01/13/2024 11:05     Radiology DG Chest Portable 1 View Result Date: 01/24/2024 CLINICAL DATA:  COPD EXAM: PORTABLE CHEST 1 VIEW COMPARISON:  01/15/2024 FINDINGS: Small left pleural effusion. No consolidation or edema. Normal heart size and stable mediastinal contours. Apical lucency compatible with history of COPD. IMPRESSION: Small left pleural effusion.  No edema or visible pneumonia. Electronically Signed   By: Ronnette Coke M.D.   On: 01/24/2024 05:43    Procedures Procedures    Medications Ordered in ED Medications  albuterol  (PROVENTIL ) (2.5 MG/3ML) 0.083% nebulizer solution 5 mg (has no administration in time range)  ipratropium-albuterol  (DUONEB) 0.5-2.5 (3) MG/3ML nebulizer solution 3 mL (3 mLs Nebulization Given 01/24/24 0221)    ED Course/ Medical Decision Making/ A&P                                 Medical Decision Making Patient with COPD discharged and had routine follow up is on antibiotics and was called and told to come in due to high white count but is on high dose steroids   Amount and/or Complexity of Data Reviewed Independent Historian:     Details: See above  External Data Reviewed: notes.    Details: Previous notes reviewed  Labs: ordered.    Details: White count elevated 18.6, normal hemoglobin 13.7, normal platelets.  Sodium 133, normal potassium 4.8, elevated creatinine 1.12  Radiology: ordered and independent interpretation performed.    Details: No PNA on CXR  Risk Prescription drug management. Risk Details: Well appearing wheezing stopped post medication.  Negative lactate.  No hypotension no tachycardia.  I suspect this is demargination from high dose steroids.  Patient wants to be discharged and feels well and has no symptoms.  On antibiotics I have advised close follow up with doctor and return for fevers or weakness.  Stable for discharge.  Strict returns.       Final Clinical Impression(s) / ED Diagnoses Final diagnoses:  Leukocytosis, unspecified type  Adverse effect of corticosteroids, initial encounter  COPD exacerbation (HCC)    No signs of systemic illness or infection. The patient is nontoxic-appearing on exam and vital signs are within normal limits.  I have reviewed the triage vital signs and the nursing notes. Pertinent labs & imaging results that were available during my care of the patient were reviewed by me and considered in my medical decision making (see chart for details). After history, exam, and medical workup I feel the patient has been appropriately medically screened and is safe for discharge home. Pertinent diagnoses were discussed with the patient. Patient was given return precautions.    Rx / DC Orders ED Discharge Orders     None         Lakeishia Truluck, MD 01/24/24 1610

## 2024-01-26 LAB — LACTIC ACID, PLASMA: LACTIC ACID: 1.2 mmol/L (ref 0.4–1.8)

## 2024-01-27 ENCOUNTER — Encounter: Payer: Self-pay | Admitting: *Deleted

## 2024-01-27 ENCOUNTER — Telehealth: Payer: Self-pay | Admitting: *Deleted

## 2024-01-28 ENCOUNTER — Encounter: Payer: Self-pay | Admitting: *Deleted

## 2024-01-28 DIAGNOSIS — J849 Interstitial pulmonary disease, unspecified: Secondary | ICD-10-CM | POA: Diagnosis not present

## 2024-01-28 DIAGNOSIS — Z7951 Long term (current) use of inhaled steroids: Secondary | ICD-10-CM | POA: Diagnosis not present

## 2024-01-28 DIAGNOSIS — J432 Centrilobular emphysema: Secondary | ICD-10-CM | POA: Diagnosis not present

## 2024-01-28 DIAGNOSIS — M79606 Pain in leg, unspecified: Secondary | ICD-10-CM | POA: Diagnosis not present

## 2024-01-28 DIAGNOSIS — J45901 Unspecified asthma with (acute) exacerbation: Secondary | ICD-10-CM | POA: Diagnosis not present

## 2024-01-28 DIAGNOSIS — I700 Atherosclerosis of aorta: Secondary | ICD-10-CM | POA: Diagnosis not present

## 2024-01-28 DIAGNOSIS — G8929 Other chronic pain: Secondary | ICD-10-CM | POA: Diagnosis not present

## 2024-01-28 DIAGNOSIS — Z7952 Long term (current) use of systemic steroids: Secondary | ICD-10-CM | POA: Diagnosis not present

## 2024-01-28 DIAGNOSIS — M810 Age-related osteoporosis without current pathological fracture: Secondary | ICD-10-CM | POA: Diagnosis not present

## 2024-01-28 DIAGNOSIS — J441 Chronic obstructive pulmonary disease with (acute) exacerbation: Secondary | ICD-10-CM | POA: Diagnosis not present

## 2024-01-28 DIAGNOSIS — E785 Hyperlipidemia, unspecified: Secondary | ICD-10-CM | POA: Diagnosis not present

## 2024-01-28 DIAGNOSIS — Z9181 History of falling: Secondary | ICD-10-CM | POA: Diagnosis not present

## 2024-01-28 DIAGNOSIS — K219 Gastro-esophageal reflux disease without esophagitis: Secondary | ICD-10-CM | POA: Diagnosis not present

## 2024-01-28 IMAGING — DX DG LUMBAR SPINE COMPLETE 4+V
5 series · 5 of 5 positions shown · non-contrast
Comparison: April 11, 2021.

CLINICAL DATA: Pain in the back.

EXAM:
LUMBAR SPINE - COMPLETE 4+ VIEW

[l-spine ap]
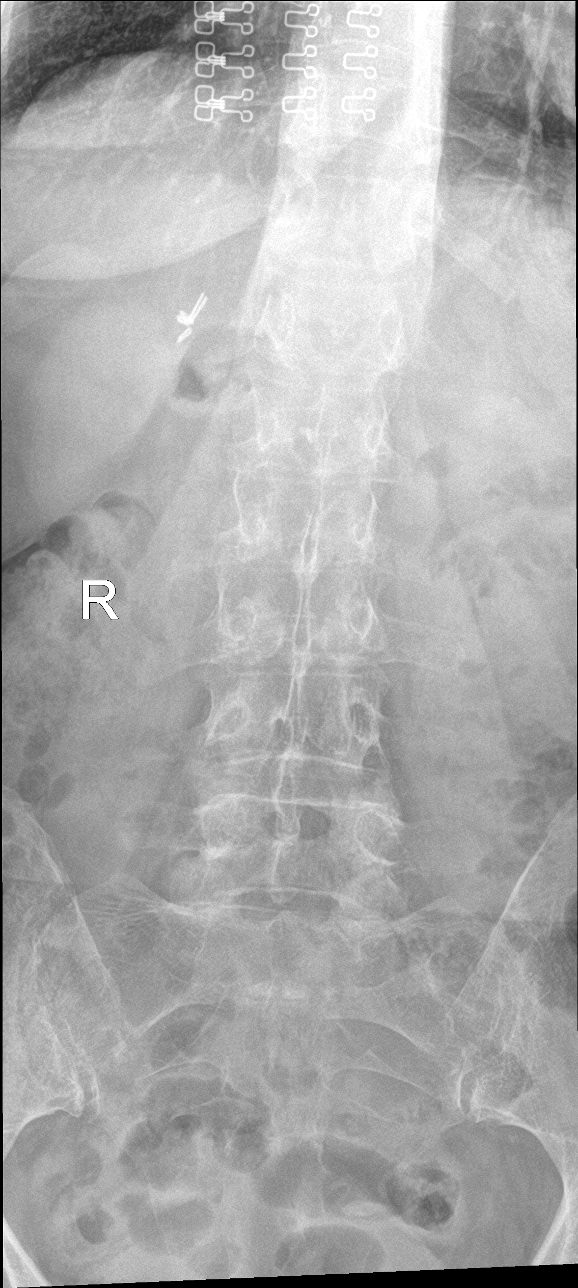

[l-spine obl (1 of 2)]
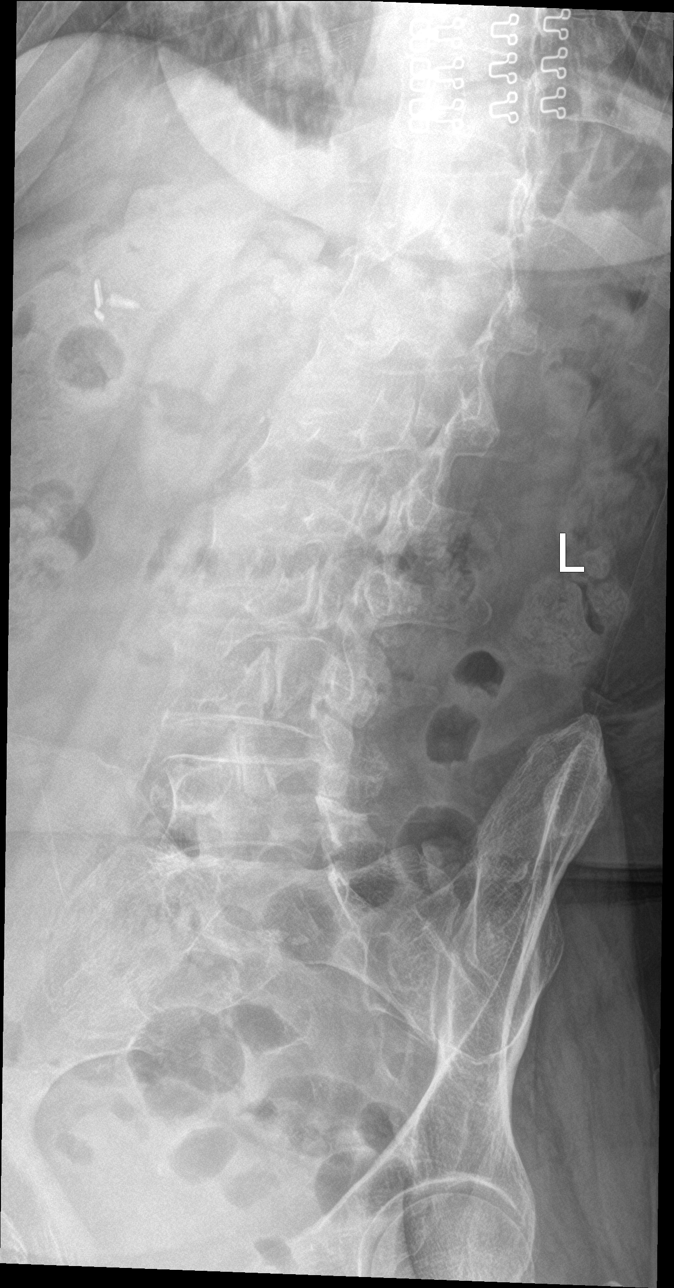

[l-spine obl (2 of 2)]
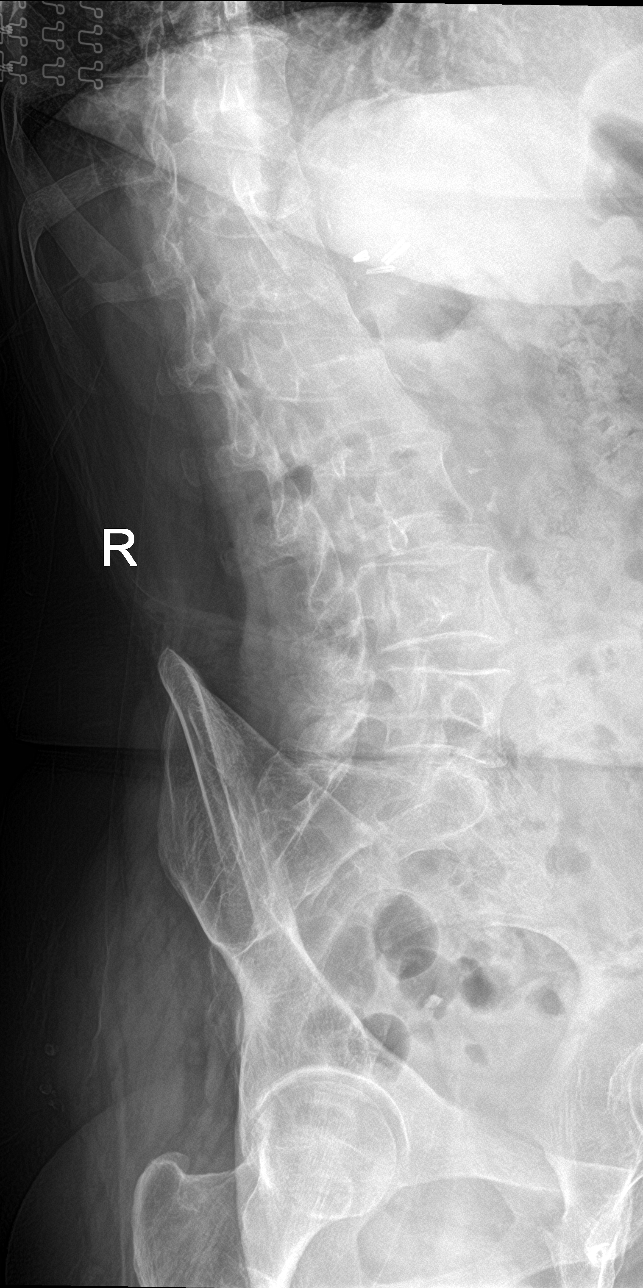

[l-spine lat (1 of 2)]
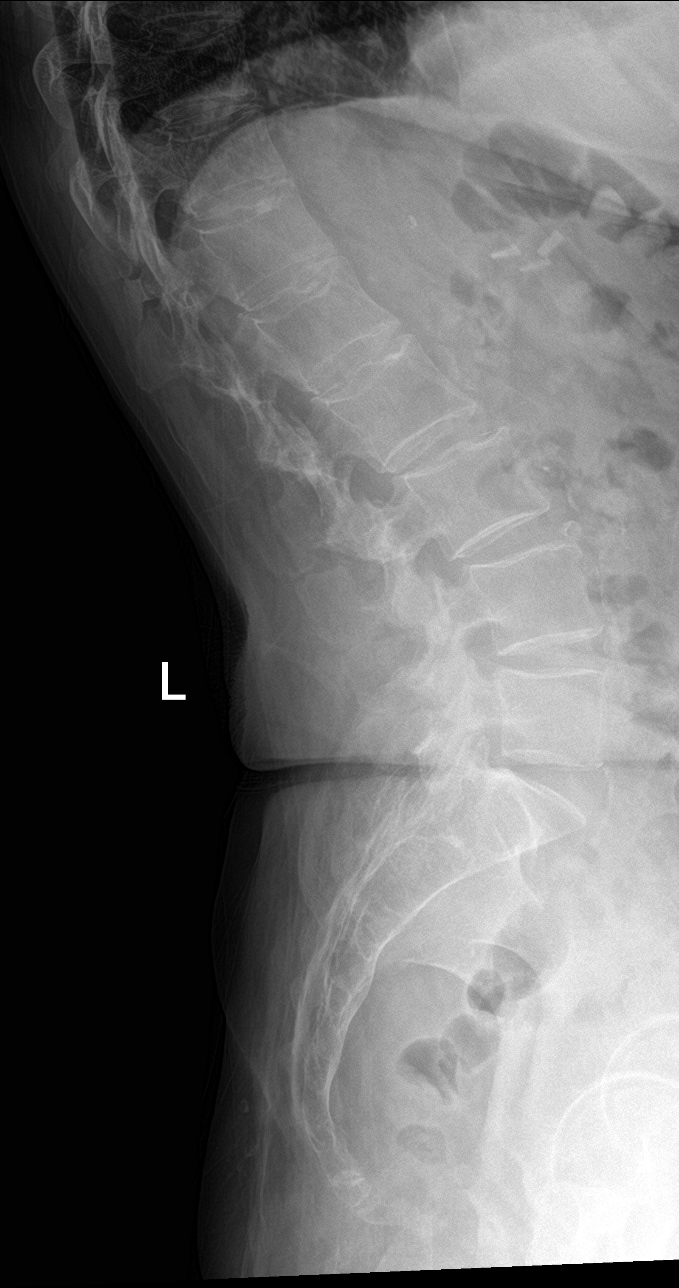

[l-spine lat (2 of 2)]
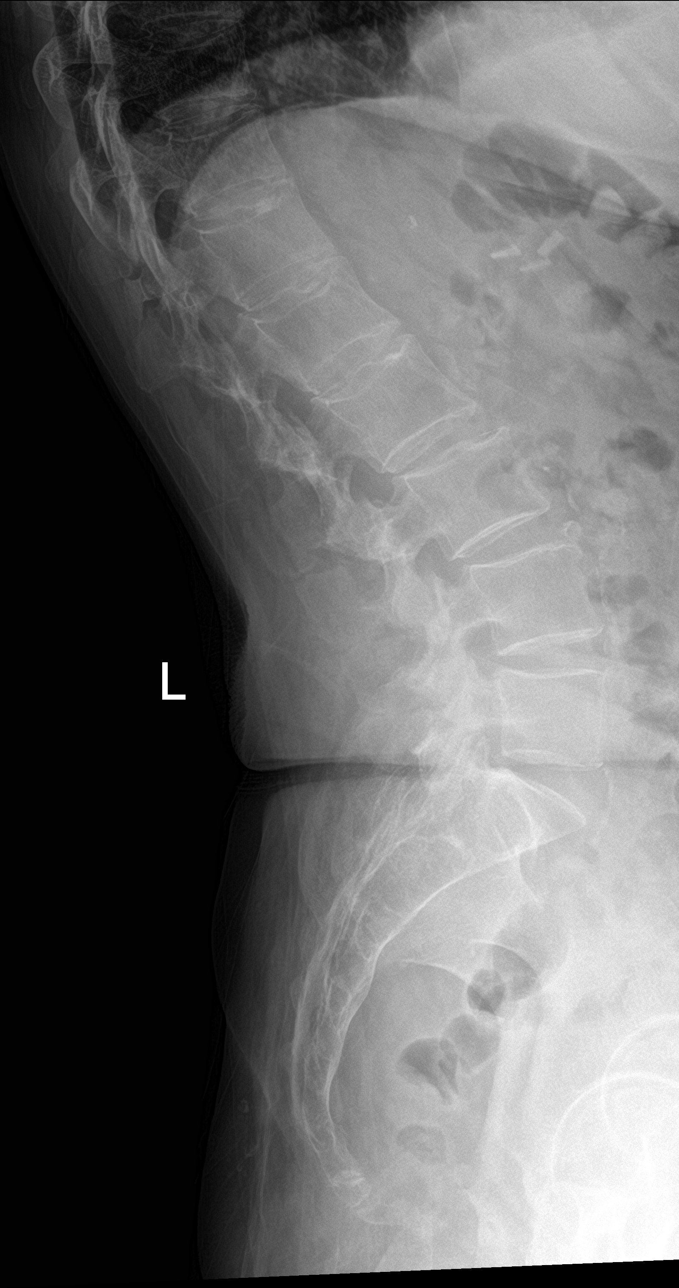

[5 of 5 positions shown; findings below may reference images not displayed]

FINDINGS: Osteopenia and spinal degenerative changes. Signs of Schmorl's nodes
in the lumbar spine in upper lumbar levels. Appearance is similar to
prior studies with mild disc space narrowing at L1-L2 and L2-L3. No
change in spinal alignment. No sign of acute fracture or static
subluxation.
IMPRESSION: Osteopenia and mild spinal degenerative changes similar to prior
studies. No sign of acute fracture or static subluxation.

## 2024-01-29 ENCOUNTER — Encounter: Payer: Self-pay | Admitting: *Deleted

## 2024-01-29 DIAGNOSIS — I700 Atherosclerosis of aorta: Secondary | ICD-10-CM | POA: Diagnosis not present

## 2024-01-29 DIAGNOSIS — M810 Age-related osteoporosis without current pathological fracture: Secondary | ICD-10-CM | POA: Diagnosis not present

## 2024-01-29 DIAGNOSIS — Z7951 Long term (current) use of inhaled steroids: Secondary | ICD-10-CM | POA: Diagnosis not present

## 2024-01-29 DIAGNOSIS — G8929 Other chronic pain: Secondary | ICD-10-CM | POA: Diagnosis not present

## 2024-01-29 DIAGNOSIS — E785 Hyperlipidemia, unspecified: Secondary | ICD-10-CM | POA: Diagnosis not present

## 2024-01-29 DIAGNOSIS — M79606 Pain in leg, unspecified: Secondary | ICD-10-CM | POA: Diagnosis not present

## 2024-01-29 DIAGNOSIS — J441 Chronic obstructive pulmonary disease with (acute) exacerbation: Secondary | ICD-10-CM | POA: Diagnosis not present

## 2024-01-29 DIAGNOSIS — K219 Gastro-esophageal reflux disease without esophagitis: Secondary | ICD-10-CM | POA: Diagnosis not present

## 2024-01-29 DIAGNOSIS — J849 Interstitial pulmonary disease, unspecified: Secondary | ICD-10-CM | POA: Diagnosis not present

## 2024-01-29 DIAGNOSIS — J45901 Unspecified asthma with (acute) exacerbation: Secondary | ICD-10-CM | POA: Diagnosis not present

## 2024-01-29 DIAGNOSIS — J432 Centrilobular emphysema: Secondary | ICD-10-CM | POA: Diagnosis not present

## 2024-01-29 DIAGNOSIS — Z9181 History of falling: Secondary | ICD-10-CM | POA: Diagnosis not present

## 2024-01-29 DIAGNOSIS — Z7952 Long term (current) use of systemic steroids: Secondary | ICD-10-CM | POA: Diagnosis not present

## 2024-01-30 DIAGNOSIS — J432 Centrilobular emphysema: Secondary | ICD-10-CM | POA: Diagnosis not present

## 2024-01-30 DIAGNOSIS — Z9181 History of falling: Secondary | ICD-10-CM | POA: Diagnosis not present

## 2024-01-30 DIAGNOSIS — Z7951 Long term (current) use of inhaled steroids: Secondary | ICD-10-CM | POA: Diagnosis not present

## 2024-01-30 DIAGNOSIS — G8929 Other chronic pain: Secondary | ICD-10-CM | POA: Diagnosis not present

## 2024-01-30 DIAGNOSIS — M810 Age-related osteoporosis without current pathological fracture: Secondary | ICD-10-CM | POA: Diagnosis not present

## 2024-01-30 DIAGNOSIS — J849 Interstitial pulmonary disease, unspecified: Secondary | ICD-10-CM | POA: Diagnosis not present

## 2024-01-30 DIAGNOSIS — J441 Chronic obstructive pulmonary disease with (acute) exacerbation: Secondary | ICD-10-CM | POA: Diagnosis not present

## 2024-01-30 DIAGNOSIS — I700 Atherosclerosis of aorta: Secondary | ICD-10-CM | POA: Diagnosis not present

## 2024-01-30 DIAGNOSIS — E785 Hyperlipidemia, unspecified: Secondary | ICD-10-CM | POA: Diagnosis not present

## 2024-01-30 DIAGNOSIS — J45901 Unspecified asthma with (acute) exacerbation: Secondary | ICD-10-CM | POA: Diagnosis not present

## 2024-01-30 DIAGNOSIS — Z7952 Long term (current) use of systemic steroids: Secondary | ICD-10-CM | POA: Diagnosis not present

## 2024-01-30 DIAGNOSIS — K219 Gastro-esophageal reflux disease without esophagitis: Secondary | ICD-10-CM | POA: Diagnosis not present

## 2024-01-30 DIAGNOSIS — M79606 Pain in leg, unspecified: Secondary | ICD-10-CM | POA: Diagnosis not present

## 2024-02-02 ENCOUNTER — Telehealth: Payer: Self-pay

## 2024-02-02 NOTE — Telephone Encounter (Signed)
 Copied from CRM [id number]. Topic: Clinical - Home Health Verbal Orders >> Jan 29, 2024  3:58 PM Jenice Mitts wrote: Caller/Agency: [facility] Callback Number: [phone] Service Requested: Occupational Therapy and Physical Therapy Frequency: 1 week 8  Any new concerns about the patient? No

## 2024-02-02 NOTE — Telephone Encounter (Signed)
 Ok for [employer]

## 2024-02-03 ENCOUNTER — Inpatient Hospital Stay: Admitting: Internal Medicine

## 2024-02-03 ENCOUNTER — Ambulatory Visit (INDEPENDENT_AMBULATORY_CARE_PROVIDER_SITE_OTHER): Admitting: Internal Medicine

## 2024-02-03 ENCOUNTER — Encounter: Payer: Self-pay | Admitting: Internal Medicine

## 2024-02-03 VITALS — BP 122/78 | HR 65 | Temp 98.5°F | Ht 64.0 in | Wt 117.0 lb

## 2024-02-03 DIAGNOSIS — D72829 Elevated white blood cell count, unspecified: Secondary | ICD-10-CM

## 2024-02-03 DIAGNOSIS — R197 Diarrhea, unspecified: Secondary | ICD-10-CM | POA: Diagnosis not present

## 2024-02-03 DIAGNOSIS — J441 Chronic obstructive pulmonary disease with (acute) exacerbation: Secondary | ICD-10-CM

## 2024-02-03 DIAGNOSIS — M810 Age-related osteoporosis without current pathological fracture: Secondary | ICD-10-CM

## 2024-02-03 MED ORDER — DIPHENOXYLATE-ATROPINE 2.5-0.025 MG PO TABS
1.0000 | ORAL_TABLET | Freq: Four times a day (QID) | ORAL | 0 refills | Status: AC | PRN
Start: 2024-02-03 — End: ?

## 2024-02-03 MED ORDER — RISEDRONATE SODIUM 35 MG PO TABS: 35.0000 mg | ORAL_TABLET | ORAL | 3 refills | Status: DC

## 2024-02-03 NOTE — Patient Instructions (Addendum)
 Please take all new medication as prescribed - the medication for diarrhea, and the Actonel 35 mg per week for the bones as the Prolia  is too expensive  No need to take further prednisone  at this time  Please continue all other medications as before, and refills have been done if requested.  Please have the pharmacy call with any other refills you may need.  Please keep your appointments with your specialists as you may have planned  Please make an Appointment to return in Apr 29 2024, or sooner if needed

## 2024-02-03 NOTE — Assessment & Plan Note (Signed)
 Pt unable to afford prolia , so ok for Actonel 35 mg weekly

## 2024-02-03 NOTE — Progress Notes (Signed)
 Patient ID: Lori [name], female   DOB: Jun 21, 1956, 68 y.o.   MRN: [id number]        Chief Complaint: follow up copd exacerbation, leukocytosis, diarrhea, osteoporosis       HPI:  Lori [name] is a 68 y.o. female here after recent copd exacerbation with tx with prednisone , found to have elevated WBC 18k, but CT chest neg for PE or PNA at ED may 10.  Since then has done well except for rather profuse diarrhea without blood or pain s/p antibiotic - Pt denies chest pain, increased sob or doe, wheezing, orthopnea, PND, increased LE swelling, palpitations, dizziness or syncope.   Pt denies polydipsia, polyuria, or new focal neuro s/s.   Also finds out today that her copay for Prolia  is over $300 so unable to afford.         Wt Readings from Last 3 Encounters:  02/03/24 117 lb (53.1 kg)  01/23/24 115 lb (52.2 kg)  01/23/24 118 lb (53.5 kg)   BP Readings from Last 3 Encounters:  02/03/24 122/78  01/24/24 113/64  01/23/24 (!) 140/82         Past Medical History:  Diagnosis Date   Allergic rhinitis    Anemia    hgb 13.9 on 11/22/21   Ankle fracture 05/2016   Anxiety    Arthritis    Asthma    Barrett esophagus 2007   Chest pain    Hx, no current problems as of 11/22/21.  Recurrent episodes.  Has had 3 negative nuclear stress test, and a completely normal coronary CT angiogram   COPD (chronic obstructive pulmonary disease) with chronic bronchitis (HCC)    & Emphysema   Depression    Duodenitis without mention of hemorrhage 2007   Esophageal reflux 2007   Esophageal stricture    Full dentures    patient does not wear dentures as of 11/22/21   H/O hiatal hernia    Hiatal hernia 2006,2007   HSV infection    Hyperlipidemia    rx crestor  but pt not taking   Multiple fractures    from falls, fx rt. elbow, fx left wrist, bilateral ankles   Pneumonia 10/2016   Restrictive lung disease 06/30/2017   Ulcer    no current problems as of 11/22/21   Past Surgical History:  Procedure Laterality  Date   ABDOMINAL HYSTERECTOMY     BSO   APPENDECTOMY     BIOPSY  04/04/2022   Procedure: BIOPSY;  Surgeon: Normie Becton., MD;  Location: [facility] ENDOSCOPY;  Service: Gastroenterology;;   CESAREAN SECTION     x 2   CHOLECYSTECTOMY     CHONDROPLASTY Left 01/06/2015   Procedure: CHONDROPLASTY;  Surgeon: Adah Acron, MD;  Location: [facility];  Service: Orthopedics;  Laterality: Left;   COLONOSCOPY     CT CTA CORONARY W/CA SCORE W/CM &/OR WO/CM  12/02/2016   CORONARY CALCIUM  SCORE 0.  NO EVIDENCE OF CAD.  Normal-sized PA.  Normal pulmonary venous drainage the left atrium.  No ASD or VSD.   ELBOW FRACTURE SURGERY     right   ESOPHAGOGASTRODUODENOSCOPY (EGD) WITH PROPOFOL  N/A 04/04/2022   Procedure: ESOPHAGOGASTRODUODENOSCOPY (EGD) WITH PROPOFOL ;  Surgeon: Brice Campi Albino Alu., MD;  Location: [facility] ENDOSCOPY;  Service: Gastroenterology;  Laterality: N/A;   EUS N/A 04/04/2022   Procedure: UPPER ENDOSCOPIC ULTRASOUND (EUS) RADIAL;  Surgeon: Normie Becton., MD;  Location: [facility] ENDOSCOPY;  Service: Gastroenterology;  Laterality: N/A;   EYE SURGERY Bilateral  cataracts removed   INSERTION OF MESH N/A 11/12/2022   Procedure: INSERTION OF MESH;  Surgeon: Shela Derby, MD;  Location: [facility] OR;  Service: General;  Laterality: N/A;   KNEE ARTHROSCOPY WITH EXCISION PLICA Left 01/06/2015   Procedure: KNEE ARTHROSCOPY WITH EXCISION PLICA;  Surgeon: Adah Acron, MD;  Location: [facility];  Service: Orthopedics;  Laterality: Left;   LIGAMENT REPAIR Right 11/28/2021   Procedure: RIGHT THUMB LIGAMENT RECONSTRUCTION AND TENDON INTERPOSITION;  Surgeon: Marilyn Shropshire, MD;  Location: [facility] OR;  Service: Orthopedics;  Laterality: Right;   Lower Extremity Arterial Dopplers  07/06/2013   RABI 1.0, LABI 1.1.; No evidence of significant vascular atherogenic plaque   NECK SURGERY     NM MYOVIEW  LTD  09/2014   a) LOW RISK. Normal EF of 65%, no RWMA. NO ISCHEMIA OR  INFARCTION.;; b) 05/02/2009: Normal study.  LOW RISK.  No ischemia or infarction.  EF 74%.  Breast attenuation noted.   TONSILLECTOMY     TRANSTHORACIC ECHOCARDIOGRAM  03/28/2021   a) Normal EF 60 to 65%.  GR 1 DD.  No R WMA.  Normal RV size and function.  Normal RAP.  Normal valves. => NORMAL;; b) 04/23/16/2010: Normal study.  LOW RISK.  No ischemia or infarction.  EF 74%.  Breast attenuation noted.17/2010: ([facility]) EF>55%.  Normal wall motion.  Normal valves.   WRIST FRACTURE SURGERY     left, has plate   XI ROBOTIC ASSISTED PARAESOPHAGEAL HERNIA REPAIR N/A 11/12/2022   Procedure: ROBOTIC DIAPHRAGMATIC HERNIA REPAIR WITH MESH;  Surgeon: Shela Derby, MD;  Location: [facility] OR;  Service: General;  Laterality: N/A;    reports that she has been smoking cigarettes. She started smoking about 46 years ago. She has a 23.2 pack-year smoking history. She has never used smokeless tobacco. She reports that she does not drink alcohol and does not use drugs. family history includes Dementia in her mother; Diabetes in her maternal aunt, maternal grandmother, and another family member; Emphysema in her father; Heart disease in her brother. Allergies  Allergen Reactions   Advil  [Ibuprofen ] Nausea And Vomiting   Bee Venom Swelling   Codeine  Nausea Only and Other (See Comments)    CAUSES ULCERS   Clonidine Derivatives Other (See Comments)    Unknown    Doxycycline  Nausea Only   Other Other (See Comments)    Pet dander and mold: Takes shots to address these   Statins Other (See Comments)    unknown   Latex Rash   Levaquin  [Levofloxacin ] Nausea Only   Propoxyphene N-Acetaminophen  Nausea Only   Current Outpatient Medications on File Prior to Visit  Medication Sig Dispense Refill   AIMOVIG 140 MG/ML SOAJ Inject 140 mg into the skin every 30 (thirty) days.     albuterol  (PROAIR  HFA) 108 (90 Base) MCG/ACT inhaler Inhale 2 puffs into the lungs every 6 (six) hours as needed for wheezing or shortness of breath.  (Patient taking differently: Inhale 2 puffs into the lungs in the morning and at bedtime.) 1 each 2   ALPRAZolam  (XANAX ) 1 MG tablet TAKE 1 TABLET BY MOUTH AT BEDTIME AS NEEDED FOR ANXIETY (Patient taking differently: Take 1 mg by mouth daily as needed for anxiety.) 90 tablet 1   benzonatate  (TESSALON ) 100 MG capsule Take 1 capsule (100 mg total) by mouth 3 (three) times daily. 90 capsule 1   budeson-glycopyrrolate -formoterol  (BREZTRI  AEROSPHERE) 160-9-4.8 MCG/ACT AERO inhaler Inhale 2 puffs into the lungs 2 (two) times daily.  cetirizine  (ZYRTEC  ALLERGY) 10 MG tablet take1 tablet by mouth once a day as needed for runny nose/drainage down throat (Patient taking differently: Take 10 mg by mouth in the morning.) 30 tablet 3   dexlansoprazole  (DEXILANT ) 60 MG capsule Take 1 capsule (60 mg total) by mouth daily before breakfast.     dextromethorphan -guaiFENesin  (MUCINEX  DM) 30-600 MG 12hr tablet Take 1 tablet by mouth 2 (two) times daily. 20 tablet 0   EPINEPHrine  0.3 mg/0.3 mL IJ SOAJ injection Inject 0.3 mg into the muscle as needed for anaphylaxis. Do not refrigerate. 2 each 1   FASENRA  30 MG/ML prefilled syringe INJECT 30MG  SUBCUTANEOUSLY EVERY 8 WEEKS 1 mL 6   FLUoxetine  (PROZAC ) 20 MG capsule Take 1 capsule (20 mg total) by mouth daily. 90 capsule 3   gabapentin  (NEURONTIN ) 600 MG tablet Take 600 mg by mouth daily in the afternoon.     meclizine  (ANTIVERT ) 12.5 MG tablet TAKE 1 TABLET BY MOUTH THREE TIMES DAILY AS NEEDED FOR DIZZINESS 60 tablet 0   montelukast  (SINGULAIR ) 10 MG tablet Take 1 tablet (10 mg total) by mouth at bedtime. 90 tablet 1   nitroGLYCERIN  (NITROSTAT ) 0.4 MG SL tablet Place 1 tablet (0.4 mg total) under the tongue every 5 (five) minutes as needed for chest pain. 25 tablet 3   ondansetron  (ZOFRAN ) 4 MG tablet Take 1 tablet (4 mg total) by mouth every 8 (eight) hours as needed for nausea or vomiting. 20 tablet 0   tiZANidine  (ZANAFLEX ) 2 MG tablet Take 1 tablet (2 mg total) by  mouth every 8 (eight) hours as needed for muscle spasms. 40 tablet 1   traMADol  (ULTRAM ) 50 MG tablet Take 1 tablet (50 mg total) by mouth every 6 (six) hours as needed. (Patient taking differently: Take 50 mg by mouth every 6 (six) hours as needed (for pain).) 60 tablet 2   valACYclovir  (VALTREX ) 500 MG tablet Take 1 tablet (500 mg total) by mouth 2 (two) times daily. 14 tablet 2   zolpidem  (AMBIEN ) 5 MG tablet Take 1 tablet (5 mg total) by mouth at bedtime as needed for sleep. 90 tablet 1   No current facility-administered medications on file prior to visit.        ROS:  All others reviewed and negative.  Objective        PE:  BP 122/78 (BP Location: Right Arm, Patient Position: Sitting, Cuff Size: Normal)   Pulse 65   Temp 98.5 F (36.9 C) (Oral)   Ht 5\' 4"  (1.626 m)   Wt 117 lb (53.1 kg)   SpO2 98%   BMI 20.08 kg/m                 Constitutional: Pt appears in NAD               HENT: Head: NCAT.                Right Ear: External ear normal.                 Left Ear: External ear normal.                Eyes: . Pupils are equal, round, and reactive to light. Conjunctivae and EOM are normal               Nose: without d/c or deformity               Neck: Neck supple. Gross normal ROM  Cardiovascular: Normal rate and regular rhythm.                 Pulmonary/Chest: Effort normal and breath sounds decreased without rales or wheezing.                Abd:  Soft, NT, ND, + BS, no organomegaly               Neurological: Pt is alert. At baseline orientation, motor grossly intact               Skin: Skin is warm. No rashes, no other new lesions, LE edema - none               Psychiatric: Pt behavior is normal without agitation   Micro: none  Cardiac tracings I have personally interpreted today:  none  Pertinent Radiological findings (summarize): none   Lab Results  Component Value Date   WBC 18.6 (H) 01/23/2024   HGB 13.7 01/23/2024   HCT 40.0 01/23/2024   PLT  360 01/23/2024   GLUCOSE 100 (H) 01/23/2024   CHOL 264 (H) 12/29/2023   TRIG 312.0 (H) 12/29/2023   HDL 42.00 12/29/2023   LDLDIRECT 178.0 04/09/2023   LDLCALC 159 (H) 12/29/2023   ALT 23 01/23/2024   AST 14 (L) 01/23/2024   NA 133 (L) 01/23/2024   K 4.8 01/23/2024   CL 98 01/23/2024   CREATININE 1.12 (H) 01/23/2024   BUN 31 (H) 01/23/2024   CO2 26 01/23/2024   TSH 2.26 12/29/2023   INR 0.9 08/31/2008   HGBA1C 5.4 12/29/2023   Assessment/Plan:  Lori [name] is a 68 y.o. White or Caucasian [1] female with  has a past medical history of Allergic rhinitis, Anemia, Ankle fracture (05/2016), Anxiety, Arthritis, Asthma, Barrett esophagus (2007), Chest pain, COPD (chronic obstructive pulmonary disease) with chronic bronchitis (HCC), Depression, Duodenitis without mention of hemorrhage (2007), Esophageal reflux (2007), Esophageal stricture, Full dentures, H/O hiatal hernia, Hiatal hernia ([id number],9604), HSV infection, Hyperlipidemia, Multiple fractures, Pneumonia (10/2016), Restrictive lung disease (06/30/2017), and Ulcer.  COPD exacerbation (HCC) Much improved, no further tx needed, cont inhaler prn  Leucocytosis Likely due to prednisone , likely to improve with finishing last prednisone  course, pt declines f/u lab today  Diarrhea Likely post antibiotic related but large volume not better with immodium, ok for limited rx lomotil  prn  Osteoporosis Pt unable to afford prolia , so ok for Actonel 35 mg weekly  Followup: Return in about 3 months (around 04/29/2024).  Rosalia Colonel, MD 02/03/2024 2:30 PM [facility]

## 2024-02-03 NOTE — Assessment & Plan Note (Signed)
 Much improved, no further tx needed, cont inhaler prn

## 2024-02-03 NOTE — Assessment & Plan Note (Signed)
 Likely post antibiotic related but large volume not better with immodium, ok for limited rx lomotil  prn

## 2024-02-03 NOTE — Telephone Encounter (Signed)
 Called and gave verbals.

## 2024-02-03 NOTE — Assessment & Plan Note (Signed)
 Likely due to prednisone , likely to improve with finishing last prednisone  course, pt declines f/u lab today

## 2024-02-05 ENCOUNTER — Telehealth: Payer: Self-pay | Admitting: Internal Medicine

## 2024-02-05 ENCOUNTER — Other Ambulatory Visit: Payer: Self-pay | Admitting: Internal Medicine

## 2024-02-05 DIAGNOSIS — J45901 Unspecified asthma with (acute) exacerbation: Secondary | ICD-10-CM | POA: Diagnosis not present

## 2024-02-05 DIAGNOSIS — M79606 Pain in leg, unspecified: Secondary | ICD-10-CM | POA: Diagnosis not present

## 2024-02-05 DIAGNOSIS — Z9181 History of falling: Secondary | ICD-10-CM | POA: Diagnosis not present

## 2024-02-05 DIAGNOSIS — J849 Interstitial pulmonary disease, unspecified: Secondary | ICD-10-CM | POA: Diagnosis not present

## 2024-02-05 DIAGNOSIS — E785 Hyperlipidemia, unspecified: Secondary | ICD-10-CM | POA: Diagnosis not present

## 2024-02-05 DIAGNOSIS — J441 Chronic obstructive pulmonary disease with (acute) exacerbation: Secondary | ICD-10-CM | POA: Diagnosis not present

## 2024-02-05 DIAGNOSIS — I700 Atherosclerosis of aorta: Secondary | ICD-10-CM | POA: Diagnosis not present

## 2024-02-05 DIAGNOSIS — G8929 Other chronic pain: Secondary | ICD-10-CM | POA: Diagnosis not present

## 2024-02-05 DIAGNOSIS — Z7952 Long term (current) use of systemic steroids: Secondary | ICD-10-CM | POA: Diagnosis not present

## 2024-02-05 DIAGNOSIS — M810 Age-related osteoporosis without current pathological fracture: Secondary | ICD-10-CM | POA: Diagnosis not present

## 2024-02-05 DIAGNOSIS — K219 Gastro-esophageal reflux disease without esophagitis: Secondary | ICD-10-CM | POA: Diagnosis not present

## 2024-02-05 DIAGNOSIS — Z7951 Long term (current) use of inhaled steroids: Secondary | ICD-10-CM | POA: Diagnosis not present

## 2024-02-05 DIAGNOSIS — J432 Centrilobular emphysema: Secondary | ICD-10-CM | POA: Diagnosis not present

## 2024-02-05 NOTE — Telephone Encounter (Signed)
 Copied from CRM [id number]. Topic: Clinical - Medication Question >> Feb 05, 2024 11:39 AM Sophia H wrote: Reason for CRM: Pt is requesting an RX for her nerves, states her hands are very shaky. Spoke w Dr. Autry Legions about this and nothing was ever sent in.  Preferred pharmacy is [facility] [address] [place], [place] - [address]

## 2024-02-06 ENCOUNTER — Other Ambulatory Visit: Payer: Self-pay | Admitting: Family

## 2024-02-06 ENCOUNTER — Other Ambulatory Visit: Payer: Self-pay | Admitting: Internal Medicine

## 2024-02-06 ENCOUNTER — Other Ambulatory Visit: Payer: Self-pay | Admitting: Allergy & Immunology

## 2024-02-06 MED ORDER — BUSPIRONE HCL 10 MG PO TABS: 10.0000 mg | ORAL_TABLET | Freq: Two times a day (BID) | ORAL | 2 refills | Status: AC

## 2024-02-06 NOTE — Telephone Encounter (Signed)
 Ok to try Buspar 10 mg prn - done eerx

## 2024-02-11 DIAGNOSIS — G8929 Other chronic pain: Secondary | ICD-10-CM | POA: Diagnosis not present

## 2024-02-11 DIAGNOSIS — I700 Atherosclerosis of aorta: Secondary | ICD-10-CM | POA: Diagnosis not present

## 2024-02-11 DIAGNOSIS — M810 Age-related osteoporosis without current pathological fracture: Secondary | ICD-10-CM | POA: Diagnosis not present

## 2024-02-11 DIAGNOSIS — J441 Chronic obstructive pulmonary disease with (acute) exacerbation: Secondary | ICD-10-CM | POA: Diagnosis not present

## 2024-02-11 DIAGNOSIS — K219 Gastro-esophageal reflux disease without esophagitis: Secondary | ICD-10-CM | POA: Diagnosis not present

## 2024-02-11 DIAGNOSIS — Z9181 History of falling: Secondary | ICD-10-CM | POA: Diagnosis not present

## 2024-02-11 DIAGNOSIS — J45901 Unspecified asthma with (acute) exacerbation: Secondary | ICD-10-CM | POA: Diagnosis not present

## 2024-02-11 DIAGNOSIS — Z7951 Long term (current) use of inhaled steroids: Secondary | ICD-10-CM | POA: Diagnosis not present

## 2024-02-11 DIAGNOSIS — J849 Interstitial pulmonary disease, unspecified: Secondary | ICD-10-CM | POA: Diagnosis not present

## 2024-02-11 DIAGNOSIS — M79606 Pain in leg, unspecified: Secondary | ICD-10-CM | POA: Diagnosis not present

## 2024-02-11 DIAGNOSIS — E785 Hyperlipidemia, unspecified: Secondary | ICD-10-CM | POA: Diagnosis not present

## 2024-02-11 DIAGNOSIS — Z7952 Long term (current) use of systemic steroids: Secondary | ICD-10-CM | POA: Diagnosis not present

## 2024-02-11 DIAGNOSIS — J432 Centrilobular emphysema: Secondary | ICD-10-CM | POA: Diagnosis not present

## 2024-02-12 ENCOUNTER — Telehealth: Payer: Self-pay | Admitting: Internal Medicine

## 2024-02-12 ENCOUNTER — Ambulatory Visit (INDEPENDENT_AMBULATORY_CARE_PROVIDER_SITE_OTHER)

## 2024-02-12 DIAGNOSIS — I700 Atherosclerosis of aorta: Secondary | ICD-10-CM | POA: Diagnosis not present

## 2024-02-12 DIAGNOSIS — Z7952 Long term (current) use of systemic steroids: Secondary | ICD-10-CM | POA: Diagnosis not present

## 2024-02-12 DIAGNOSIS — J45901 Unspecified asthma with (acute) exacerbation: Secondary | ICD-10-CM | POA: Diagnosis not present

## 2024-02-12 DIAGNOSIS — K219 Gastro-esophageal reflux disease without esophagitis: Secondary | ICD-10-CM | POA: Diagnosis not present

## 2024-02-12 DIAGNOSIS — M79606 Pain in leg, unspecified: Secondary | ICD-10-CM | POA: Diagnosis not present

## 2024-02-12 DIAGNOSIS — J849 Interstitial pulmonary disease, unspecified: Secondary | ICD-10-CM | POA: Diagnosis not present

## 2024-02-12 DIAGNOSIS — J441 Chronic obstructive pulmonary disease with (acute) exacerbation: Secondary | ICD-10-CM | POA: Diagnosis not present

## 2024-02-12 DIAGNOSIS — Z7951 Long term (current) use of inhaled steroids: Secondary | ICD-10-CM | POA: Diagnosis not present

## 2024-02-12 DIAGNOSIS — Z9181 History of falling: Secondary | ICD-10-CM | POA: Diagnosis not present

## 2024-02-12 DIAGNOSIS — M810 Age-related osteoporosis without current pathological fracture: Secondary | ICD-10-CM | POA: Diagnosis not present

## 2024-02-12 DIAGNOSIS — E785 Hyperlipidemia, unspecified: Secondary | ICD-10-CM | POA: Diagnosis not present

## 2024-02-12 DIAGNOSIS — J309 Allergic rhinitis, unspecified: Secondary | ICD-10-CM

## 2024-02-12 DIAGNOSIS — G8929 Other chronic pain: Secondary | ICD-10-CM | POA: Diagnosis not present

## 2024-02-12 DIAGNOSIS — J432 Centrilobular emphysema: Secondary | ICD-10-CM | POA: Diagnosis not present

## 2024-02-12 NOTE — Telephone Encounter (Signed)
 Copied from CRM [id number]. Topic: Clinical - Prescription Issue >> Feb 11, 2024  4:54 PM Stanly Early wrote: Reason for CRM: Lynnie Saucier called to see if a fax was received, I called the call and spoke with antonio. He stated there was nothing sent to them. Please give a callback to the pharmacy to put in the request for a refill [phone]

## 2024-02-16 ENCOUNTER — Ambulatory Visit

## 2024-02-16 DIAGNOSIS — G43719 Chronic migraine without aura, intractable, without status migrainosus: Secondary | ICD-10-CM | POA: Diagnosis not present

## 2024-02-16 DIAGNOSIS — J4550 Severe persistent asthma, uncomplicated: Secondary | ICD-10-CM | POA: Diagnosis not present

## 2024-02-16 MED ORDER — BENRALIZUMAB 30 MG/ML ~~LOC~~ SOSY
30.0000 mg | PREFILLED_SYRINGE | SUBCUTANEOUS | Status: AC
  Administered 2024-02-16 – 2024-09-28 (×5): 30 mg via SUBCUTANEOUS

## 2024-02-19 ENCOUNTER — Telehealth: Payer: Self-pay | Admitting: Internal Medicine

## 2024-02-19 NOTE — Telephone Encounter (Signed)
 Refill was already sent on 02/06/24 to [facility].

## 2024-02-19 NOTE — Telephone Encounter (Signed)
 Copied from CRM [id number]. Topic: Clinical - Medication Question >> Feb 19, 2024 10:00 AM Aisha D wrote: Reason for CRM: Pt stated that she needs a medication refill for the tiZANidine  (ZANAFLEX ) 2 MG tablet. Pt stated that she was told by the office that she was getting her medication transferred to a new pharmacy that will deliver the medication. Pt stated that she doesn't know the name of the pharmacy and wanted to verify the new pharmacy with the office.

## 2024-02-27 ENCOUNTER — Other Ambulatory Visit: Payer: Self-pay | Admitting: Family Medicine

## 2024-02-27 ENCOUNTER — Other Ambulatory Visit: Payer: Self-pay | Admitting: Internal Medicine

## 2024-02-27 ENCOUNTER — Telehealth: Payer: Self-pay | Admitting: Family Medicine

## 2024-02-27 DIAGNOSIS — B009 Herpesviral infection, unspecified: Secondary | ICD-10-CM

## 2024-02-27 DIAGNOSIS — J441 Chronic obstructive pulmonary disease with (acute) exacerbation: Secondary | ICD-10-CM

## 2024-02-27 NOTE — Telephone Encounter (Unsigned)
 Copied from CRM [id number]. Topic: Clinical - Medication Refill >> Feb 27, 2024  2:21 PM Lori [name] wrote: Medication: ALPRAZolam  (XANAX ) 1 MG tablet zolpidem  (AMBIEN ) 5 MG tablet benzonatate  (TESSALON ) 100 MG capsule  Has the patient contacted their pharmacy? Yes (Agent: If no, request that the patient contact the pharmacy for the refill. If patient does not wish to contact the pharmacy document the reason why and proceed with request.) (Agent: If yes, when and what did the pharmacy advise?)  This is the patient's preferred pharmacy:    [facility], [place] - [address] [place] [address] Phone: [phone] Fax: [phone]    Is this the correct pharmacy for this prescription? Yes If no, delete pharmacy and type the correct one.   Has the prescription been filled recently? No  Is the patient out of the medication? Yes  Has the patient been seen for an appointment in the last year OR does the patient have an upcoming appointment? Yes  Can we respond through MyChart? Yes  Agent: Please be advised that Rx refills may take up to 3 business days. We ask that you follow-up with your pharmacy.

## 2024-02-27 NOTE — Telephone Encounter (Signed)
 Copied from CRM [id number]. Topic: Clinical - Medication Refill >> Feb 27, 2024  2:20 PM Trula Gable C wrote: Medication: ALPRAZolam  (XANAX ) 1 MG tablet zolpidem  (AMBIEN ) 5 MG tablet benzonatate  (TESSALON ) 100 MG capsule  Has the patient contacted their pharmacy? Yes (Agent: If no, request that the patient contact the pharmacy for the refill. If patient does not wish to contact the pharmacy document the reason why and proceed with request.) (Agent: If yes, when and what did the pharmacy advise?)  This is the patient's preferred pharmacy:  [facility], [place] - [address] [place] [address] Phone: [phone] Fax: [phone]    Is this the correct pharmacy for this prescription? Yes If no, delete pharmacy and type the correct one.   Has the prescription been filled recently? No  Is the patient out of the medication? Yes  Has the patient been seen for an appointment in the last year OR does the patient have an upcoming appointment? Yes  Can we respond through MyChart? Yes  Agent: Please be advised that Rx refills may take up to 3 business days. We ask that you follow-up with your pharmacy.

## 2024-03-02 MED ORDER — BENZONATATE 100 MG PO CAPS: 100.0000 mg | ORAL_CAPSULE | Freq: Three times a day (TID) | ORAL | 1 refills | Status: AC

## 2024-03-02 MED ORDER — ALPRAZOLAM 1 MG PO TABS: ORAL_TABLET | ORAL | 1 refills | Status: DC

## 2024-03-02 MED ORDER — ZOLPIDEM TARTRATE 5 MG PO TABS: 5.0000 mg | ORAL_TABLET | Freq: Every evening | ORAL | 1 refills | Status: DC | PRN

## 2024-03-04 ENCOUNTER — Telehealth: Payer: Self-pay | Admitting: Internal Medicine

## 2024-03-04 NOTE — Telephone Encounter (Signed)
 Copied from CRM [id number]. Topic: Clinical - Medication Question >> Mar 04, 2024  3:13 PM Baldo Levan wrote: Reason for CRM: Patient would like it noted for all future medications to send them to [facility], [place] - [address] Patient will no longer be using [facility] after tomorrow.

## 2024-03-04 NOTE — Telephone Encounter (Signed)
 Pharmacy has been updated on Pt chart.

## 2024-03-05 ENCOUNTER — Other Ambulatory Visit (INDEPENDENT_AMBULATORY_CARE_PROVIDER_SITE_OTHER): Payer: Self-pay

## 2024-03-05 ENCOUNTER — Ambulatory Visit (INDEPENDENT_AMBULATORY_CARE_PROVIDER_SITE_OTHER): Admitting: Orthopedic Surgery

## 2024-03-05 DIAGNOSIS — M25571 Pain in right ankle and joints of right foot: Secondary | ICD-10-CM | POA: Diagnosis not present

## 2024-03-05 DIAGNOSIS — M25572 Pain in left ankle and joints of left foot: Secondary | ICD-10-CM

## 2024-03-05 DIAGNOSIS — M1712 Unilateral primary osteoarthritis, left knee: Secondary | ICD-10-CM

## 2024-03-05 IMAGING — DX DG CHEST 2V
2 series · 2 of 2 positions shown · non-contrast
Comparison: 09/05/2021

CLINICAL DATA: 65-year-old female with severe right anterior chest
wall pain

EXAM:
CHEST - 2 VIEW

[chest pa]
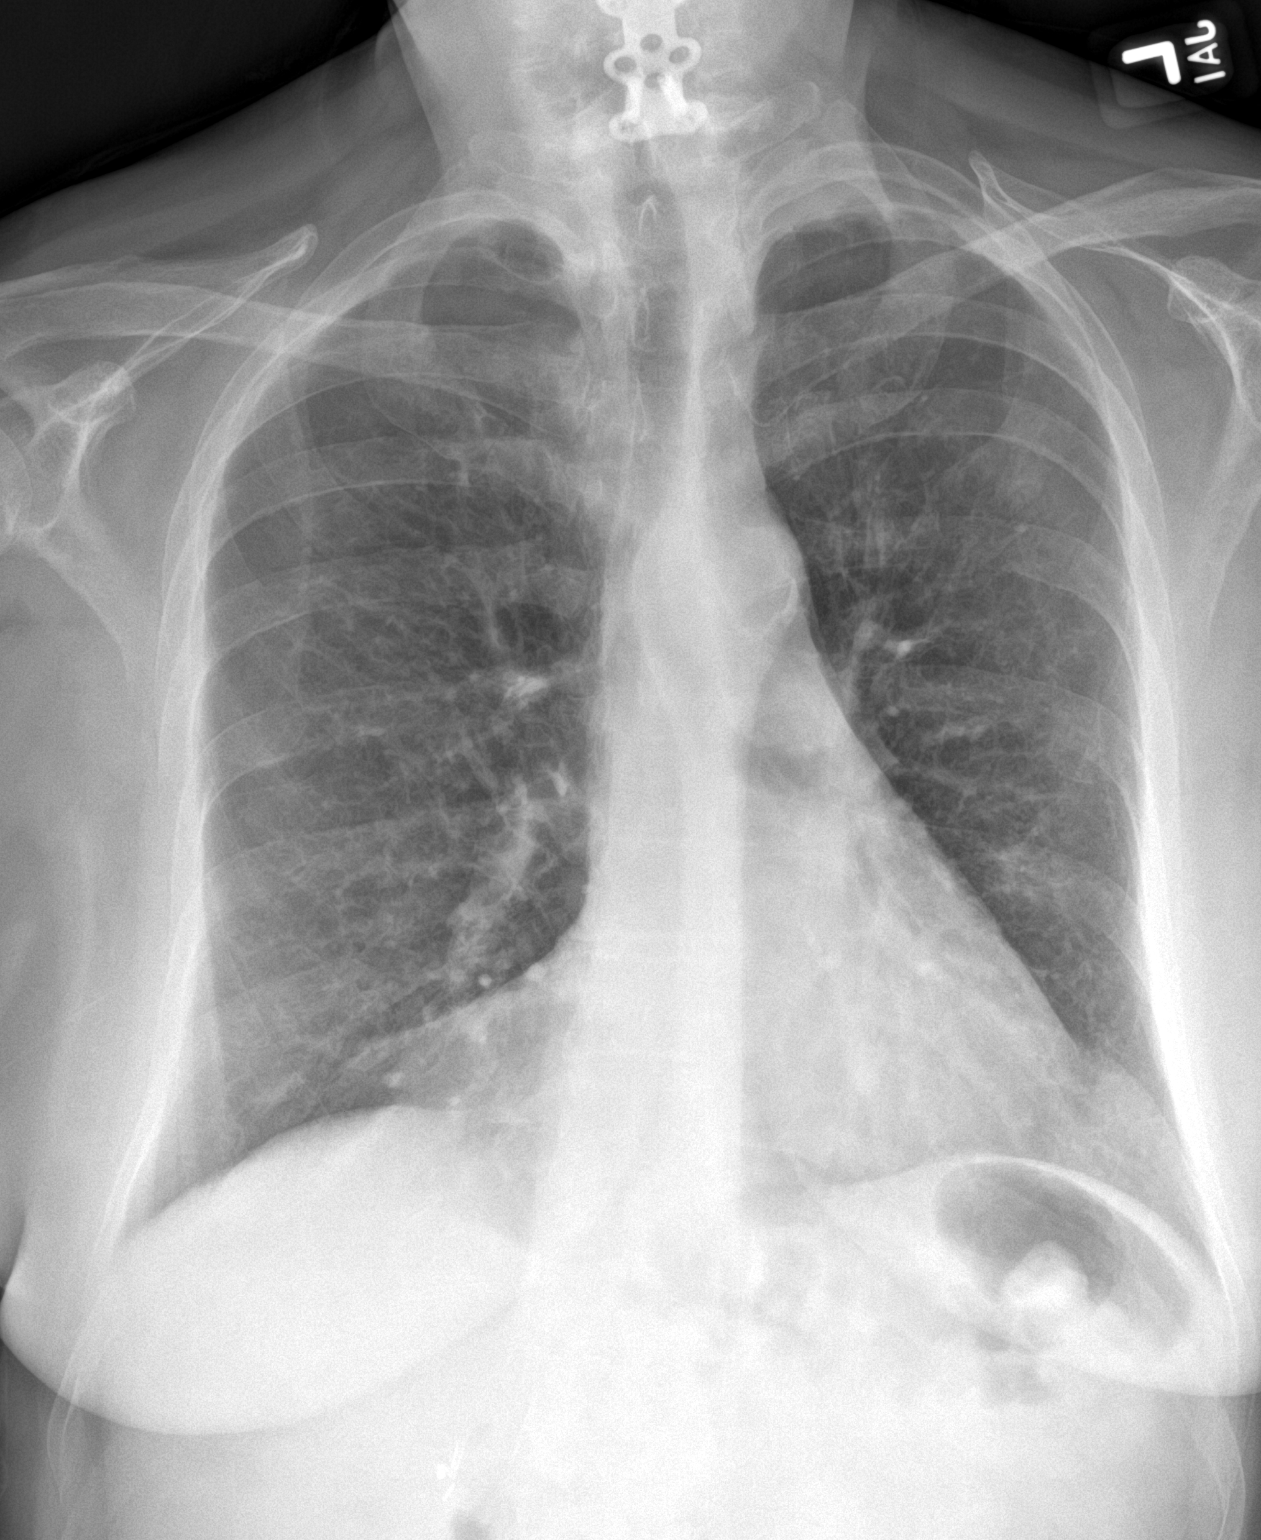

[chest lat]
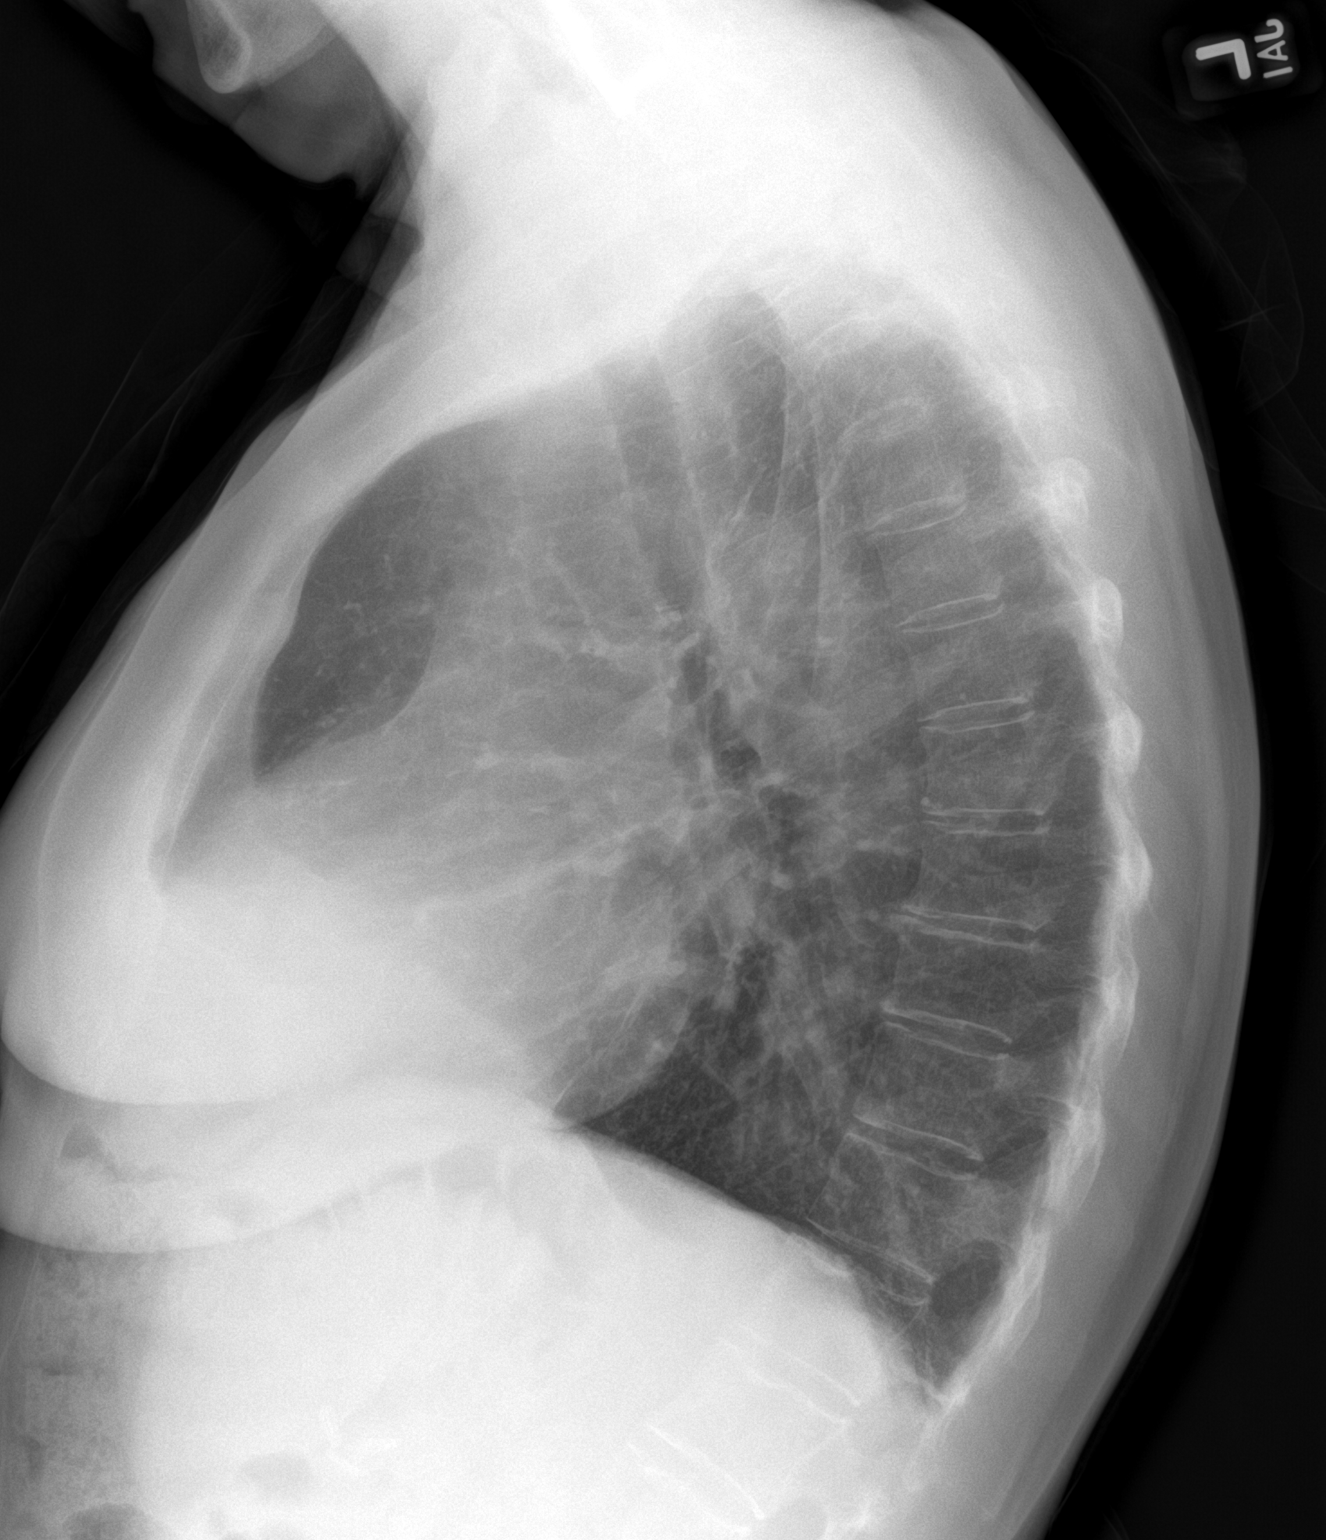

[2 of 2 positions shown; findings below may reference images not displayed]

FINDINGS: Cardiomediastinal silhouette unchanged in size and contour. No
evidence of central vascular congestion. No interlobular septal
thickening.

Stigmata of emphysema, with increased retrosternal airspace,
flattened hemidiaphragms, increased AP diameter, and hyperinflation
on the AP view.

No pneumothorax or pleural effusion. Coarsened interstitial
markings, with no confluent airspace disease.

No acute displaced fracture. Degenerative changes of the spine.

Surgical changes of the cervical region
IMPRESSION: Similar appearance of the chest x-ray with emphysema/chronic changes
and no definite evidence of acute cardiopulmonary disease

## 2024-03-06 ENCOUNTER — Encounter: Payer: Self-pay | Admitting: Orthopedic Surgery

## 2024-03-06 MED ORDER — BUPIVACAINE HCL 0.25 % IJ SOLN
4.0000 mL | INTRAMUSCULAR | Status: AC | PRN
  Administered 2024-03-05: 4 mL via INTRA_ARTICULAR

## 2024-03-06 MED ORDER — TRIAMCINOLONE ACETONIDE 40 MG/ML IJ SUSP
40.0000 mg | INTRAMUSCULAR | Status: AC | PRN
  Administered 2024-03-05: 40 mg via INTRA_ARTICULAR

## 2024-03-06 MED ORDER — LIDOCAINE HCL 1 % IJ SOLN
5.0000 mL | INTRAMUSCULAR | Status: AC | PRN
  Administered 2024-03-05: 5 mL

## 2024-03-06 NOTE — Progress Notes (Signed)
 Office Visit Note   Patient: Lori [name]           Date of Birth: [birth year]           MRN: [id number] Visit Date: 03/05/2024 Requested by: Norleen Lynwood ORN, MD [address] [place],  [place] [address] PCP: Norleen Lynwood ORN, MD  Subjective: Chief Complaint  Patient presents with   Right Ankle - Pain   Left Ankle - Pain   Left Knee - Pain    HPI: Lori [name] is a 68 y.o. female who presents to the office reporting bilateral ankle pain with no history of injury.  Patient states that her ankles lock up.  Has been worse in the last month.  Has not had any prior surgery or injury to the ankles.  Is not taking pain medicine.  Also has a history of some mild left knee pain which has been worse the last month.  Has a history of remote knee arthroscopy..                ROS: All systems reviewed are negative as they relate to the chief complaint within the history of present illness.  Patient denies fevers or chills.  Assessment & Plan: Visit Diagnoses:  1. Pain in right ankle and joints of right foot   2. Pain in left ankle and joints of left foot     Plan: Impression is left-sided mild posterior tib tendon deficiency noted on examination from the back with some mild collapse of the midfoot.  This could be treated with well molded arch supports which would supinate the foot a little bit more.  Regarding the left knee there is no effusion and good range of motion.  Could try an injection in the left knee to see if that helps.  We will see her back as needed.  Follow-Up Instructions: No follow-ups on file.   Orders:  Orders Placed This Encounter  Procedures   XR Ankle Complete Right   XR Ankle Complete Left   No orders of the defined types were placed in this encounter.     Procedures: Large Joint Inj: L knee on 03/05/2024 4:42 PM Indications: diagnostic evaluation, joint swelling and pain Details: 18 G 1.5 in needle, superolateral approach  Arthrogram: No  Medications:  5 mL lidocaine  1 %; 4 mL bupivacaine  0.25 %; 40 mg triamcinolone  acetonide 40 MG/ML Outcome: tolerated well, no immediate complications Procedure, treatment alternatives, risks and benefits explained, specific risks discussed. Consent was given by the patient. Immediately prior to procedure a time out was called to verify the correct patient, procedure, equipment, support staff and site/side marked as required. Patient was prepped and draped in the usual sterile fashion.       Clinical Data: No additional findings.  Objective: Vital Signs: There were no vitals taken for this visit.  Physical Exam:  Constitutional: Patient appears well-developed HEENT:  Head: Normocephalic Eyes:EOM are normal Neck: Normal range of motion Cardiovascular: Normal rate Pulmonary/chest: Effort normal Neurologic: Patient is alert Skin: Skin is warm Psychiatric: Patient has normal mood and affect  Ortho Exam: Ortho exam demonstrates perfused and sensate feet.  Patient does have some midfoot collapse on the left compared to the right consistent with mild posterior tib tendon deficiency.  Patient otherwise has bilateral palpable intact and nontender anterior tib Achilles and peroneal tendons.  No masses lymphadenopathy or skin changes noted in bilateral foot region.  Left knee has full range of motion stable collateral  cruciate ligaments as well as intact extensor mechanism.  No masses lymphadenopathy or skin changes noted in bilateral knee region.  Specialty Comments:  No specialty comments available.  Imaging: No results found.   PMFS History: Patient Active Problem List   Diagnosis Date Noted   Diarrhea 02/03/2024   Leucocytosis 01/23/2024   Asthma, chronic obstructive, with acute exacerbation (HCC) 01/15/2024   Lower back pain 12/29/2023   Osteoporosis 12/29/2023   Chest pain 06/08/2023   Hypomagnesemia 05/15/2023   Upper abdominal pain 09/19/2022   Constipation 09/19/2022   Lower abdominal  pain 06/25/2022   COVID-19 virus infection 06/09/2022   Pain in left ankle and joints of left foot 05/15/2022   Weight loss 01/10/2022   Arthritis of carpometacarpal (CMC) joint of right thumb 10/25/2021   Right-sided chest pain 09/05/2021   Neck pain 04/11/2021   Thoracic spine pain 04/11/2021   Low back pain 04/11/2021   Bilateral hip pain 04/11/2021   AKI (acute kidney injury) (HCC) 04/11/2021   Allergic rhinitis 01/08/2021   Abnormal urine odor 11/14/2020   Aortic atherosclerosis (HCC) 11/14/2020   Severe persistent asthma without complication 11/02/2020   Hymenoptera allergy 11/02/2020   Genital herpes 11/12/2019   Coccyx pain 11/12/2019   Insomnia 11/12/2019   Acute right-sided low back pain without sciatica 07/20/2019   Grief 07/20/2019   Vitamin D  deficiency 07/20/2019   Hyperglycemia 04/21/2019   Left knee pain 04/21/2019   Pain 12/29/2018   B12 deficiency 10/20/2018   Bilateral hand pain 08/03/2018   Nausea 04/27/2018   Anemia, iron  deficiency 04/27/2018   Intermittent paresthesia of hand and foot 04/27/2018   Headache 03/21/2018   Lumbar radiculitis 08/15/2017   Degenerative disc disease, lumbar 07/21/2017   Encounter for well adult exam with abnormal findings 06/30/2017   Epigastric pain 06/30/2017   Restrictive lung disease 06/30/2017   Leg cramping 06/09/2017   Elbow contusion 06/09/2017   Right elbow pain 05/27/2017   Asthma-COPD overlap syndrome (HCC) 01/31/2017   Seasonal and perennial allergic rhinitis 01/01/2017   Moderate persistent asthma, uncomplicated 01/01/2017   Pulmonary emphysema (HCC) 12/03/2016   Tobacco abuse 12/03/2016   Non-seasonal allergic rhinitis due to fungal spores 12/03/2016   Medication management 11/29/2016   Rash 11/23/2016   Gastroesophageal reflux disease    Community acquired pneumonia of left lower lobe of lung 10/26/2016   Vertigo 10/22/2016   Gastroesophageal reflux disease with esophagitis 10/22/2016   Non compliance  w medication regimen 08/01/2016   Allergic contact dermatitis due to metals 08/01/2016   Migraine without aura and without status migrainosus, not intractable 07/22/2016   Spasm of muscle of lower back 06/21/2016   Chronic rhinitis 06/19/2016   Compliance poor 06/19/2016   Gait difficulty 06/21/2015   Fibromyalgia 06/21/2015   Conversion reaction 06/21/2015   Anxiety and depression 06/21/2015   Syncope and collapse 06/21/2015   Plica syndrome of left knee 01/06/2015   Anxiety state 09/06/2014   History of MI 09/06/2014   Cigarette smoker 06/27/2013   Borderline hypertension 06/27/2013   Chronic low back pain with sciatica 06/14/2013   Leg pain, bilateral 06/14/2013   Gastroparesis 10/26/2009   Musculoskeletal chest pain 05/02/2009   Hyperlipidemia LDL goal <100 11/18/2008   COPD exacerbation (HCC) 11/18/2008   HEPATIC CYST 11/18/2008   Past Medical History:  Diagnosis Date   Allergic rhinitis    Anemia    hgb 13.9 on 11/22/21   Ankle fracture 05/2016   Anxiety    Arthritis  Asthma    Barrett esophagus 2007   Chest pain    Hx, no current problems as of 11/22/21.  Recurrent episodes.  Has had 3 negative nuclear stress test, and a completely normal coronary CT angiogram   COPD (chronic obstructive pulmonary disease) with chronic bronchitis (HCC)    & Emphysema   Depression    Duodenitis without mention of hemorrhage 2007   Esophageal reflux 2007   Esophageal stricture    Full dentures    patient does not wear dentures as of 11/22/21   H/O hiatal hernia    Hiatal hernia 2006,2007   HSV infection    Hyperlipidemia    rx crestor  but pt not taking   Multiple fractures    from falls, fx rt. elbow, fx left wrist, bilateral ankles   Pneumonia 10/2016   Restrictive lung disease 06/30/2017   Ulcer    no current problems as of 11/22/21    Family History  Problem Relation Age of Onset   Emphysema Father    Dementia Mother    Diabetes Maternal Grandmother    Diabetes Maternal  Aunt    Diabetes Other        mat. cousin   Heart disease Brother    Colon cancer Neg Hx    Allergic rhinitis Neg Hx    Angioedema Neg Hx    Asthma Neg Hx    Atopy Neg Hx    Eczema Neg Hx    Immunodeficiency Neg Hx    Urticaria Neg Hx     Past Surgical History:  Procedure Laterality Date   ABDOMINAL HYSTERECTOMY     BSO   APPENDECTOMY     BIOPSY  04/04/2022   Procedure: BIOPSY;  Surgeon: Wilhelmenia, Aloha Raddle., MD;  Location: [facility];  Service: Gastroenterology;;   CESAREAN SECTION     x 2   CHOLECYSTECTOMY     CHONDROPLASTY Left 01/06/2015   Procedure: CHONDROPLASTY;  Surgeon: Oneil JAYSON Herald, MD;  Location: [facility];  Service: Orthopedics;  Laterality: Left;   COLONOSCOPY     CT CTA CORONARY W/CA SCORE W/CM &/OR WO/CM  12/02/2016   CORONARY CALCIUM  SCORE 0.  NO EVIDENCE OF CAD.  Normal-sized PA.  Normal pulmonary venous drainage the left atrium.  No ASD or VSD.   ELBOW FRACTURE SURGERY     right   ESOPHAGOGASTRODUODENOSCOPY (EGD) WITH PROPOFOL  N/A 04/04/2022   Procedure: ESOPHAGOGASTRODUODENOSCOPY (EGD) WITH PROPOFOL ;  Surgeon: Wilhelmenia Aloha Raddle., MD;  Location[facility] END[facility]COPY;  Service: Gastroenterology;  Laterality: N/A;   EUS N/A 04/04/2022   Procedure: UPPER ENDOSCOPIC ULTRASOUND (EUS) RADIAL;  Surgeon: Wilhelmenia Aloha Raddle., MD;  Location: [facility] ENDOSCOPY;  Service: Gastroenterology;  Laterality: N/A;   EYE SURGERY Bilateral    cataracts removed   INSERTION OF MESH N/A 11/12/2022   Procedure: INSERTION OF MESH;  Surgeon: Rubin Calamity, MD;  Location: [facility] OR;  Service: General;  Laterality: N/A;   KNEE ARTHROSCOPY WITH EXCISION PLICA Left 01/06/2015   Procedure: KNEE ARTHROSCOPY WITH EXCISION PLICA;  Surgeon: Oneil JAYSON Herald, MD;  Location: [facility];  Service: Orthopedics;  Laterality: Left;   LIGAMENT REPAIR Right 11/28/2021   Procedure: RIGHT THUMB LIGAMENT RECONSTRUCTION AND TENDON INTERPOSITION;  Surgeon: Romona Harari, MD;   Location: [facility] OR;  Service: Orthopedics;  Laterality: Right;   Lower Extremity Arterial Dopplers  07/06/2013   RABI 1.0, LABI 1.1.; No evidence of significant vascular atherogenic plaque   NECK SURGERY     NM MYOVIEW   LTD  09/2014   a) LOW RISK. Normal EF of 65%, no RWMA. NO ISCHEMIA OR INFARCTION.;; b) 05/02/2009: Normal study.  LOW RISK.  No ischemia or infarction.  EF 74%.  Breast attenuation noted.   TONSILLECTOMY     TRANSTHORACIC ECHOCARDIOGRAM  03/28/2021   a) Normal EF 60 to 65%.  GR 1 DD.  No R WMA.  Normal RV size and function.  Normal RAP.  Normal valves. => NORMAL;; b) 04/23/16/2010: Normal study.  LOW RISK.  No ischemia or infarction.  EF 74%.  Breast attenuation noted.17/2010: ([facility]) EF>55%.  Normal wall motion.  Normal valves.   WRIST FRACTURE SURGERY     left, has plate   XI ROBOTIC ASSISTED PARAESOPHAGEAL HERNIA REPAIR N/A 11/12/2022   Procedure: ROBOTIC DIAPHRAGMATIC HERNIA REPAIR WITH MESH;  Surgeon: Rubin Calamity, MD;  Location: [facility] OR;  Service: General;  Laterality: N/A;   Social History   Occupational History   Occupation: disable  Tobacco Use   Smoking status: Every Day    Current packs/day: 0.50    Average packs/day: 0.5 packs/day for 46.6 years (23.3 ttl pk-yrs)    Types: Cigarettes    Start date: 08/06/1977   Smokeless tobacco: Never   Tobacco comments:    Patient has cut back to 2-3 per day as of 11/22/21  Vaping Use   Vaping status: Never Used  Substance and Sexual Activity   Alcohol use: No    Alcohol/week: 0.0 standard drinks of alcohol   Drug use: No   Sexual activity: Not Currently    Birth control/protection: Surgical    Comment: Hysterectomy

## 2024-03-11 ENCOUNTER — Ambulatory Visit (INDEPENDENT_AMBULATORY_CARE_PROVIDER_SITE_OTHER)

## 2024-03-11 DIAGNOSIS — J309 Allergic rhinitis, unspecified: Secondary | ICD-10-CM

## 2024-03-12 ENCOUNTER — Other Ambulatory Visit: Payer: Self-pay | Admitting: Internal Medicine

## 2024-03-12 IMAGING — CT CT ABD-PELV W/ CM
1 of 3 series · 13 of 32 positions shown, 18 images · IV contrast (agent unspecified)
Comparison: CT abdomen and pelvis 11/14/2017.

CLINICAL DATA: Epigastric pain.

EXAM:
CT ABDOMEN AND PELVIS WITH CONTRAST
TECHNIQUE: Multidetector CT imaging of the abdomen and pelvis was performed
using the standard protocol following bolus administration of
intravenous contrast.

[Series 2: abd/pelvis w/cm · axial · 0.70mm/px · z∈[-60,+345]mm · 13 of 93 slices shown, 18 images]
[im 6/93  soft-tissue]
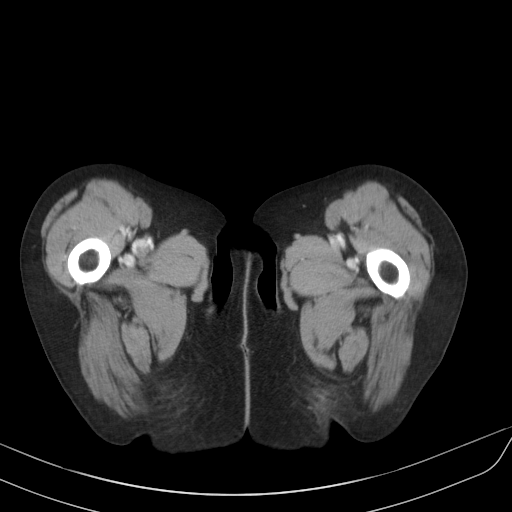
[im 6/93  bone]
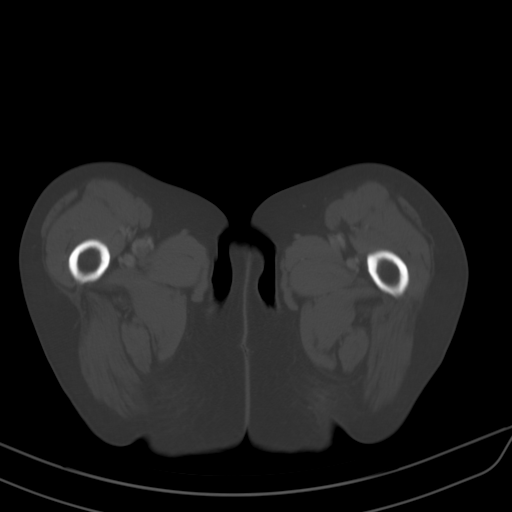
[im 17/93  soft-tissue]
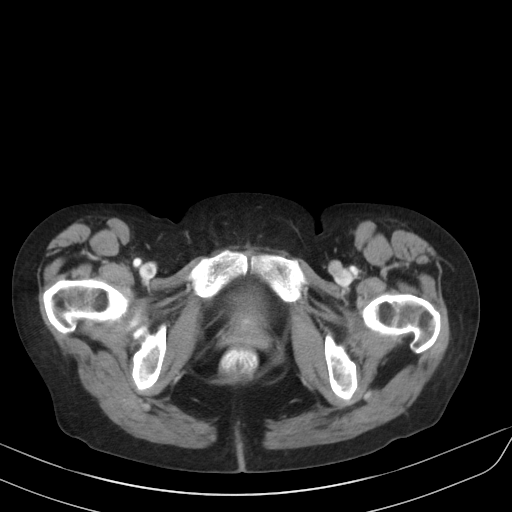
[im 22/93  soft-tissue]
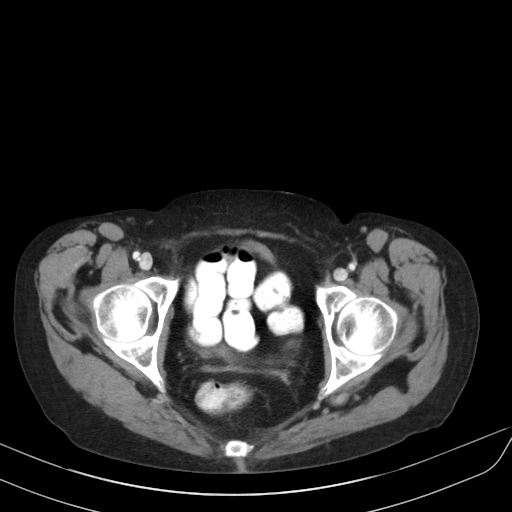
[im 28/93  soft-tissue]
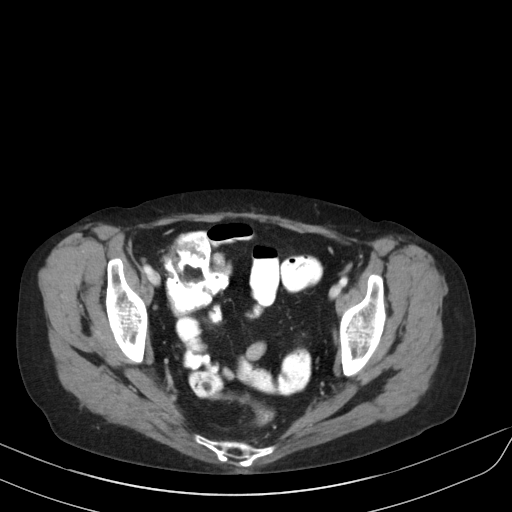
[im 38/93  soft-tissue]
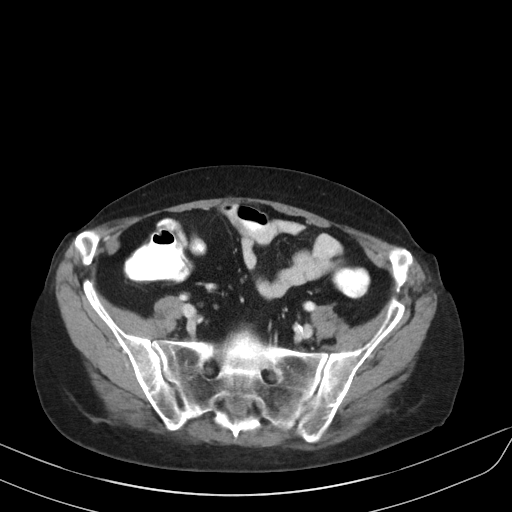
[im 44/93  soft-tissue]
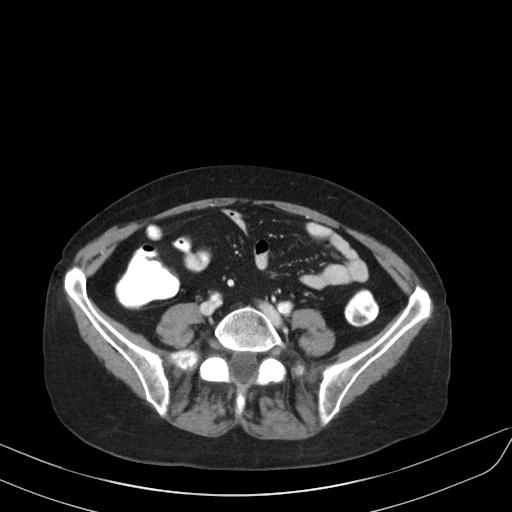
[im 49/93  soft-tissue]
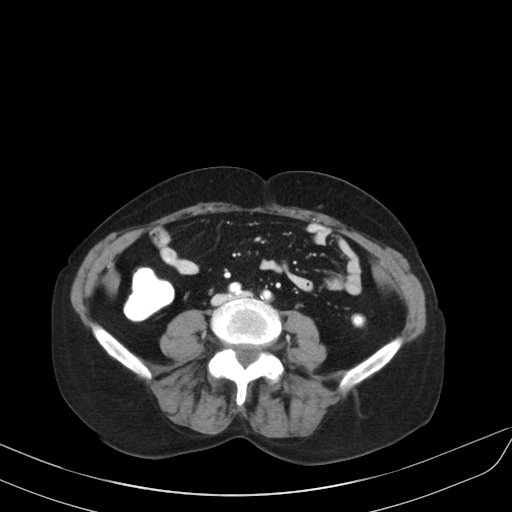
[im 60/93  soft-tissue]
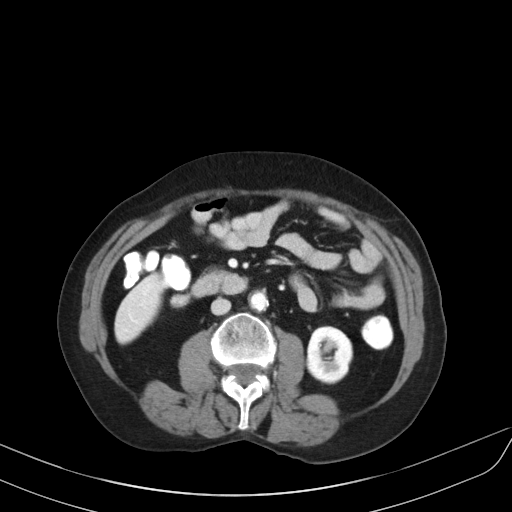
[im 65/93  soft-tissue]
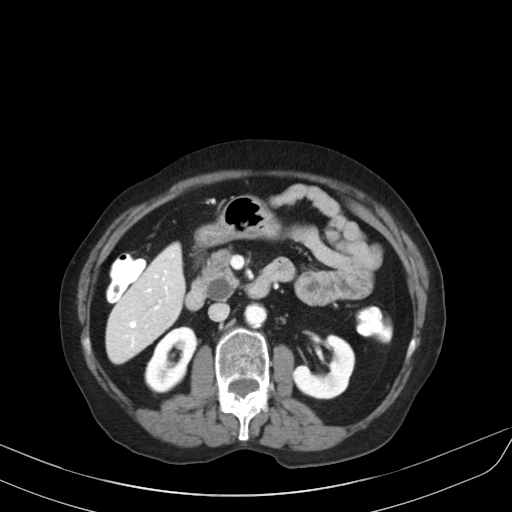
[im 65/93  bone]
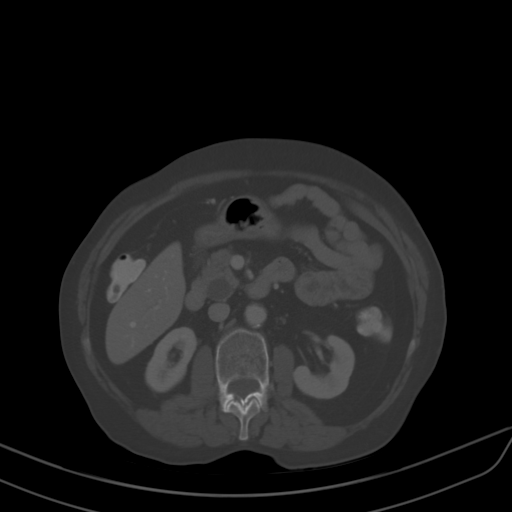
[im 71/93  soft-tissue]
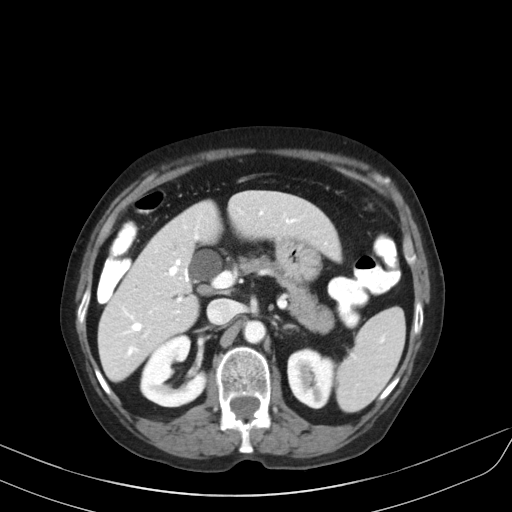
[im 71/93  lung]
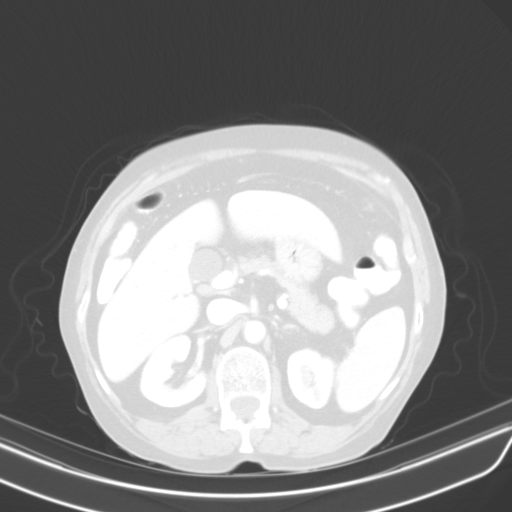
[im 76/93  lung]
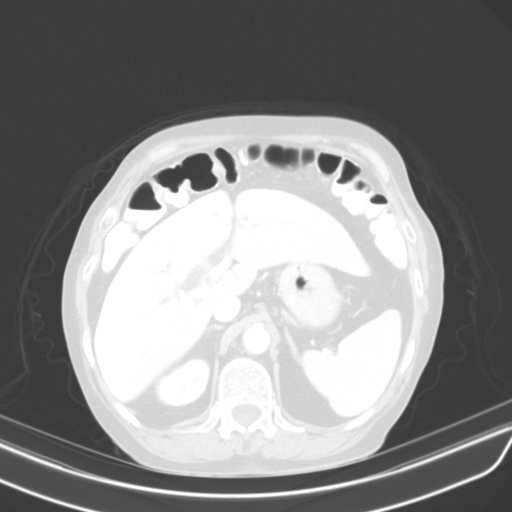
[im 82/93  soft-tissue]
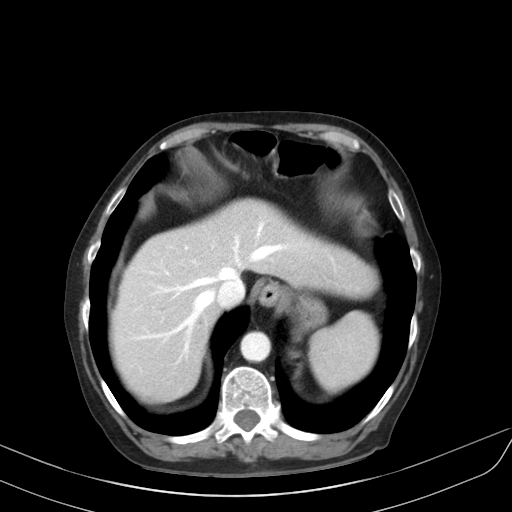
[im 82/93  lung]
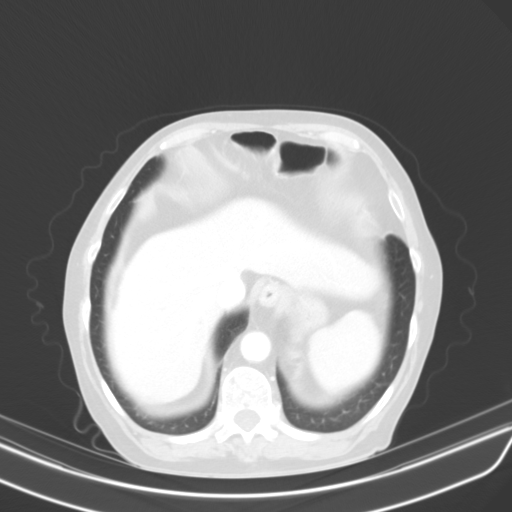
[im 87/93  soft-tissue]
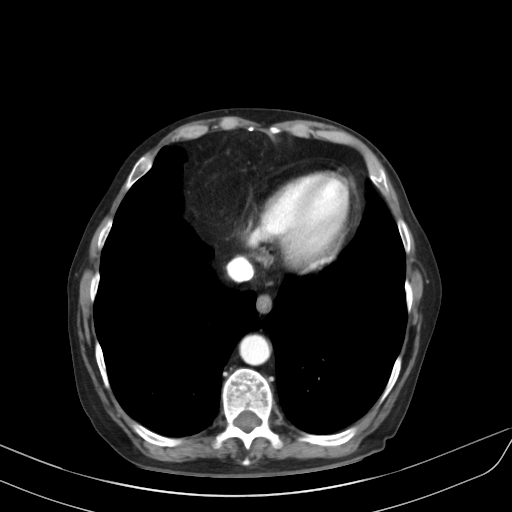
[im 87/93  lung]
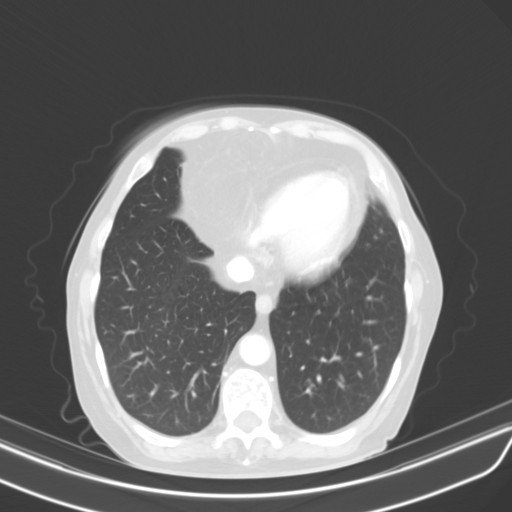

[13 of 32 positions shown; findings below may reference images not displayed]

RADIATION DOSE REDUCTION: This exam was performed according to the
departmental dose-optimization program which includes automated
exposure control, adjustment of the mA and/or kV according to
patient size and/or use of iterative reconstruction technique.

CONTRAST:  100mL TER29Y-CKK IOPAMIDOL (TER29Y-CKK) INJECTION 61%
FINDINGS: Lower chest: Mild emphysematous changes are present.

Hepatobiliary: Gallbladder surgically absent. There is dilatation of
the common bile duct measuring up to 15 mm. This is increased from
prior. There is also new mild intrahepatic biliary ductal
dilatation. No focal liver lesions are identified.

Pancreas: Unremarkable. No pancreatic ductal dilatation or
surrounding inflammatory changes.

Spleen: Normal in size without focal abnormality.

Adrenals/Urinary Tract: Adrenal glands are unremarkable. Kidneys are
normal, without renal calculi, focal lesion, or hydronephrosis.
Bladder is unremarkable.

Stomach/Bowel: Stomach is within normal limits. Appendix is not
seen. No evidence of bowel wall thickening, distention, or
inflammatory changes.

Vascular/Lymphatic: Aortic atherosclerosis. No enlarged abdominal or
pelvic lymph nodes.

Reproductive: Status post hysterectomy. No adnexal masses.

Other: No abdominal wall hernia or abnormality. No abdominopelvic
ascites.

Musculoskeletal: No acute or significant osseous findings.
IMPRESSION: 1. Patient is status post cholecystectomy. There is intra and
extrahepatic biliary ductal dilatation which is new/increased from
prior examination. Findings are indeterminate. Distal biliary
obstruction not excluded. Recommend correlation with lab values.
This can be further evaluated with MRCP or ERCP.
2. Aortic Atherosclerosis (EBRPR-8UK.K) and Emphysema (EBRPR-C44.O).

## 2024-03-15 ENCOUNTER — Other Ambulatory Visit: Payer: Self-pay | Admitting: Internal Medicine

## 2024-03-15 NOTE — Telephone Encounter (Signed)
 Copied from CRM [id number]. Topic: Clinical - Medication Refill >> Mar 15, 2024  3:23 PM [place] A wrote: Medication:  traMADol  (ULTRAM ) 50 MG tablet   Has the patient contacted their pharmacy? Yes (Agent: If no, request that the patient contact the pharmacy for the refill. If patient does not wish to contact the pharmacy document the reason why and proceed with request.) (Agent: If yes, when and what did the pharmacy advise?)  This is the patient's preferred pharmacy:    [facility] [address] [place] - [address] [place] [address] Phone: [phone] Fax: [phone]  Is this the correct pharmacy for this prescription? Yes If no, delete pharmacy and type the correct one.   Has the prescription been filled recently? No  Is the patient out of the medication? Yes  Has the patient been seen for an appointment in the last year OR does the patient have an upcoming appointment? Yes  Can we respond through MyChart? No  Agent: Please be advised that Rx refills may take up to 3 business days. We ask that you follow-up with your pharmacy.

## 2024-03-18 DIAGNOSIS — G43719 Chronic migraine without aura, intractable, without status migrainosus: Secondary | ICD-10-CM | POA: Diagnosis not present

## 2024-03-18 DIAGNOSIS — M542 Cervicalgia: Secondary | ICD-10-CM | POA: Diagnosis not present

## 2024-03-18 IMAGING — DX DG CHEST 2V
2 series · 2 of 2 positions shown · non-contrast
Comparison: Chest radiograph January 10, 2022.

CLINICAL DATA: Patient with productive cough and shortness of
breath.

EXAM:
CHEST - 2 VIEW

[chest pa]
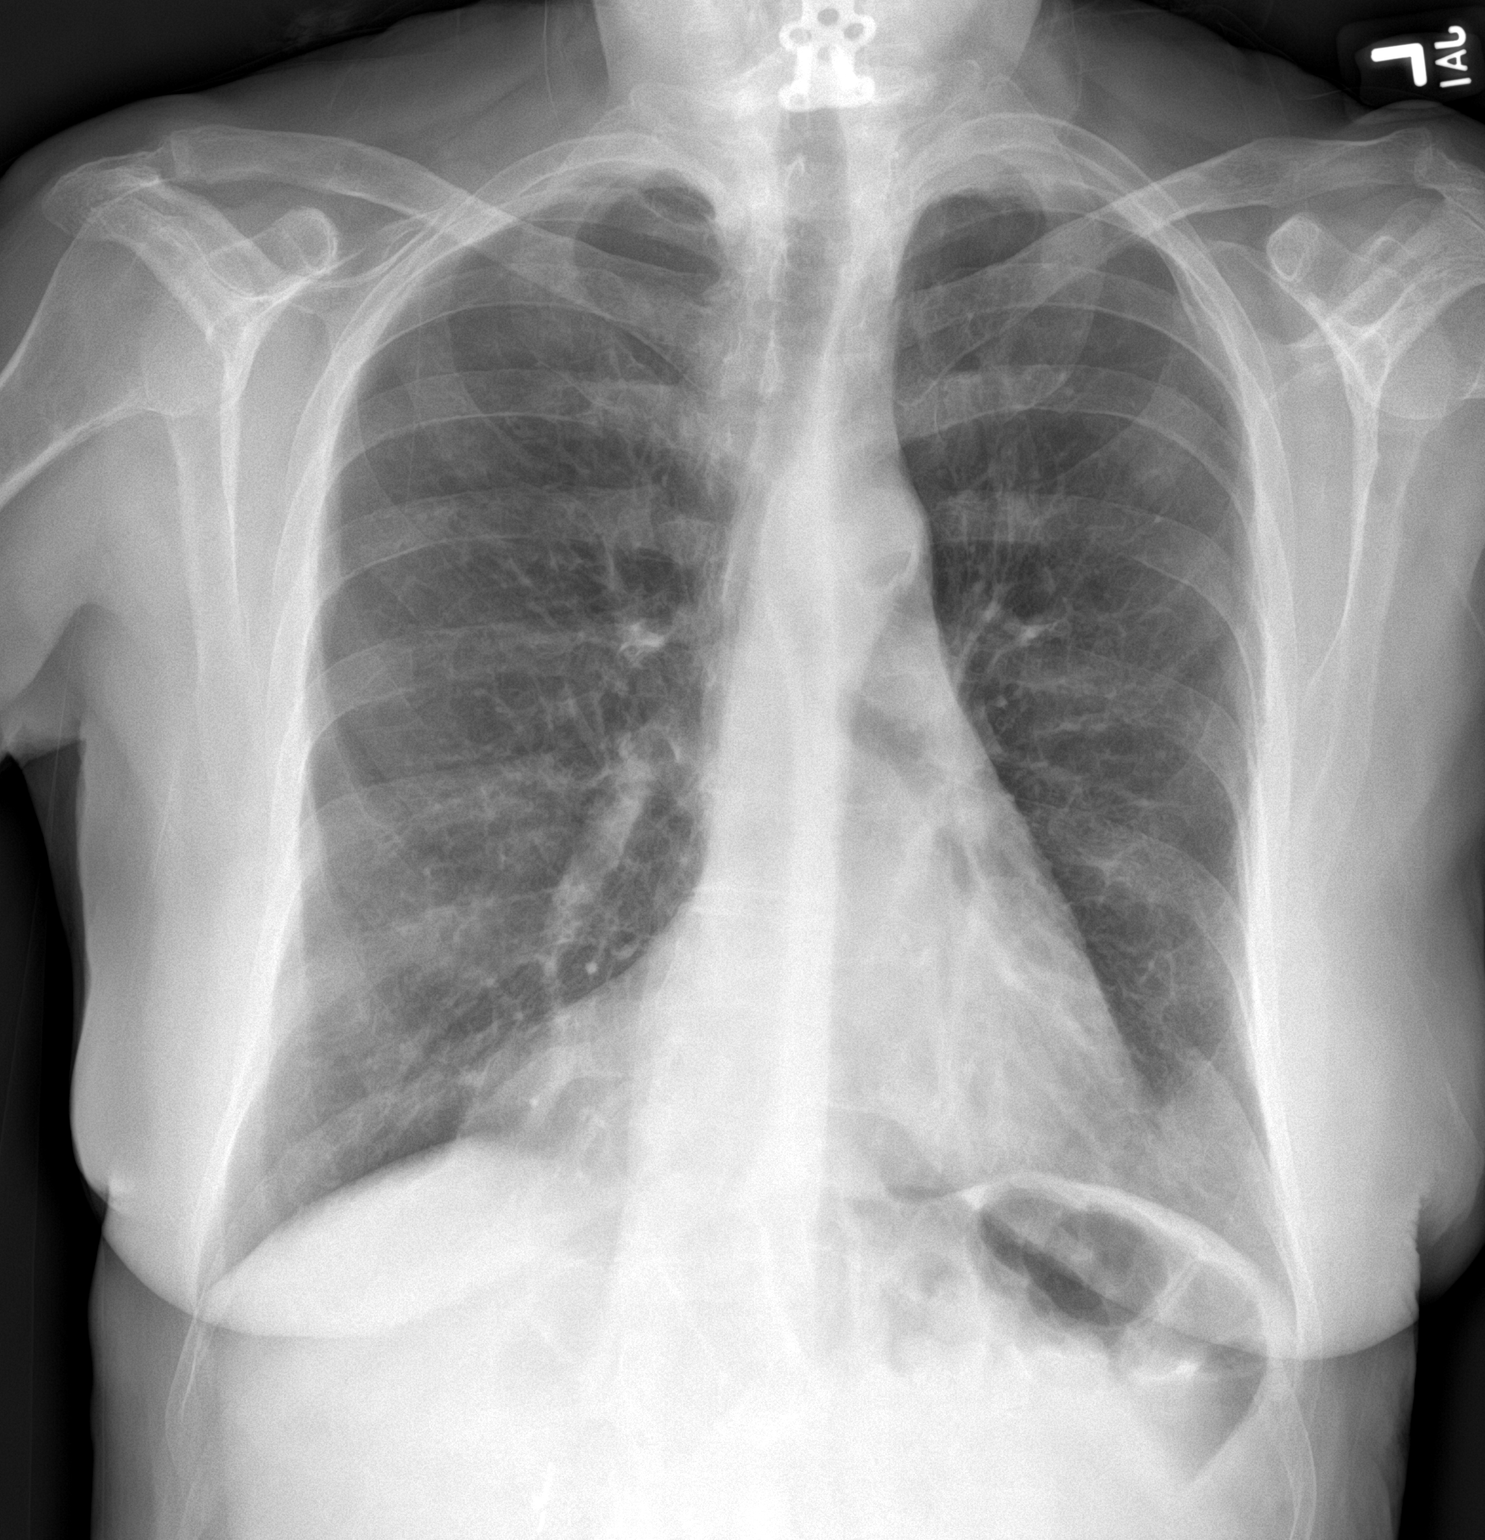

[chest lat]
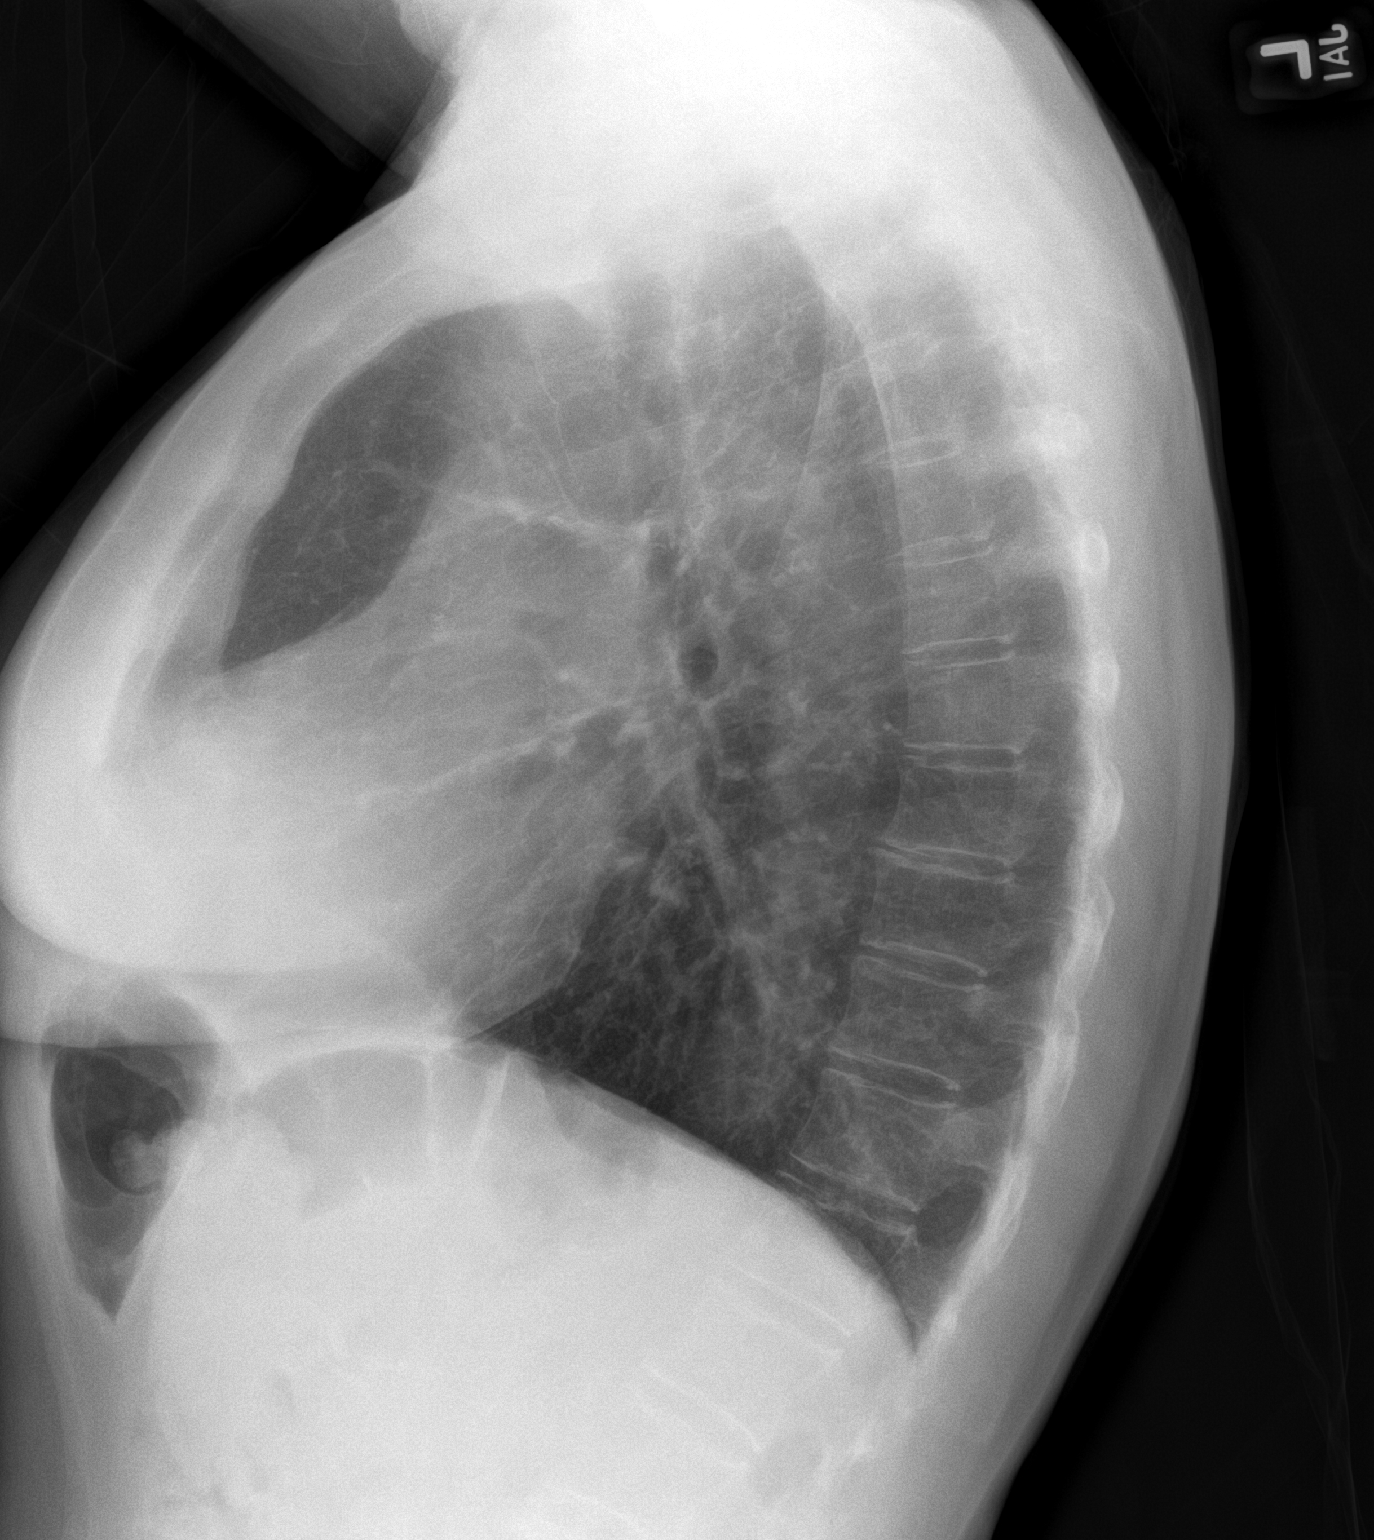

[2 of 2 positions shown; findings below may reference images not displayed]

FINDINGS: Stable cardiac and mediastinal contours. No consolidative pulmonary
opacities. No pleural effusion or pneumothorax. Thoracic spine
degenerative changes. Surgical change cervical spine.
IMPRESSION: No active cardiopulmonary disease.

## 2024-03-19 IMAGING — MR MR ABDOMEN WO/W CM MRCP
17 of 19 series · 41 of 48 positions shown · IV contrast (9 ml multihance)
Comparison: CT AP the from 01/17/2022.

CLINICAL DATA: Nausea, loss of appetite and abdominal pain for 2-3
months.

EXAM:
MRI ABDOMEN WITHOUT AND WITH CONTRAST (INCLUDING MRCP)
TECHNIQUE: Multiplanar multisequence MR imaging of the abdomen was performed
both before and after the administration of intravenous contrast.
Heavily T2-weighted images of the biliary and pancreatic ducts were
obtained, and three-dimensional MRCP images were rendered by post
processing.
CONTRAST:  9mL MULTIHANCE GADOBENATE DIMEGLUMINE 529 MG/ML IV SOLN

[Series 4: T2 · axial · 5.0mm · 1.48mm/px · 1 of 33 slices shown (1 of 4)]
[im 1/33]
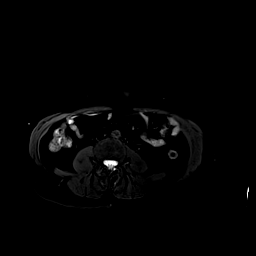

[Series 5: T2 · coronal · 5.0mm · 1.56mm/px · 1 of 38 slices shown (2 of 4)]
[im 1/38]
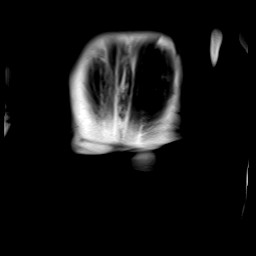

[Series 6: T1 · axial · 3.0mm · 1.19mm/px · z∈[-80,+97]mm · 4 of 120 slices shown]
[im 1/120]
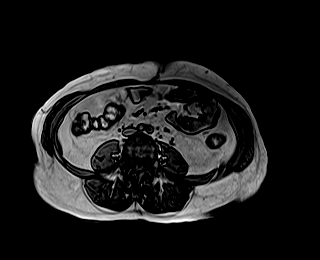
[im 40/120]
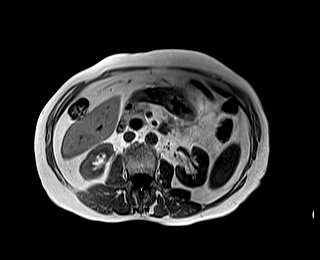
[im 80/120]
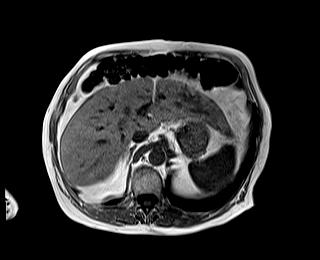
[im 120/120]
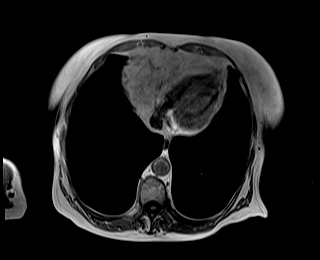

[Series 7: DWI · axial · 5.0mm · 1.42mm/px · z∈[-78,+132]mm · 4 of 108 slices shown (1 of 2)]
[im 1/108]
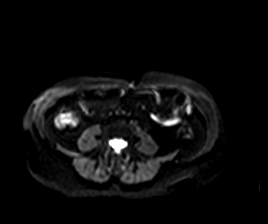
[im 36/108]
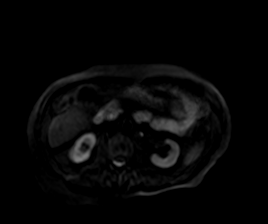
[im 72/108]
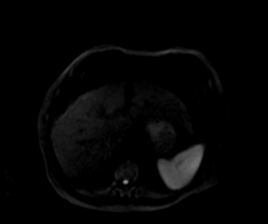
[im 108/108]
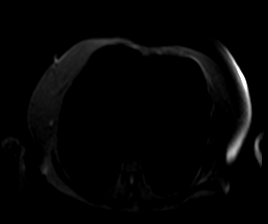

[Series 8: DWI · axial · 5.0mm · 1.42mm/px · z∈[-78,+132]mm · 2 of 36 slices shown (2 of 2)]
[im 1/36]
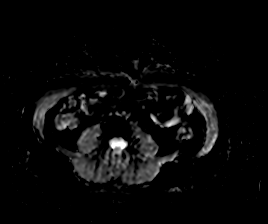
[im 36/36]
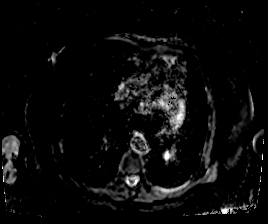

[Series 9: T2 · axial · 6.0mm · 1.22mm/px · 1 of 28 slices shown (3 of 4)]
[im 1/28]
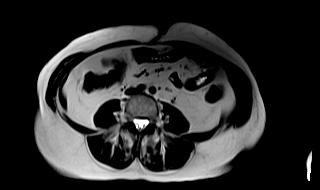

[Series 10: bSSFP · axial · 5.0mm · 1.25mm/px · 1 of 34 slices shown]
[im 1/34]
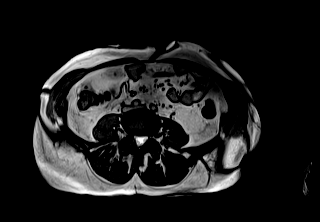

[Series 13: MRCP · coronal · 1.0mm · 0.49mm/px · 3 of 64 slices shown]
[im 1/64]
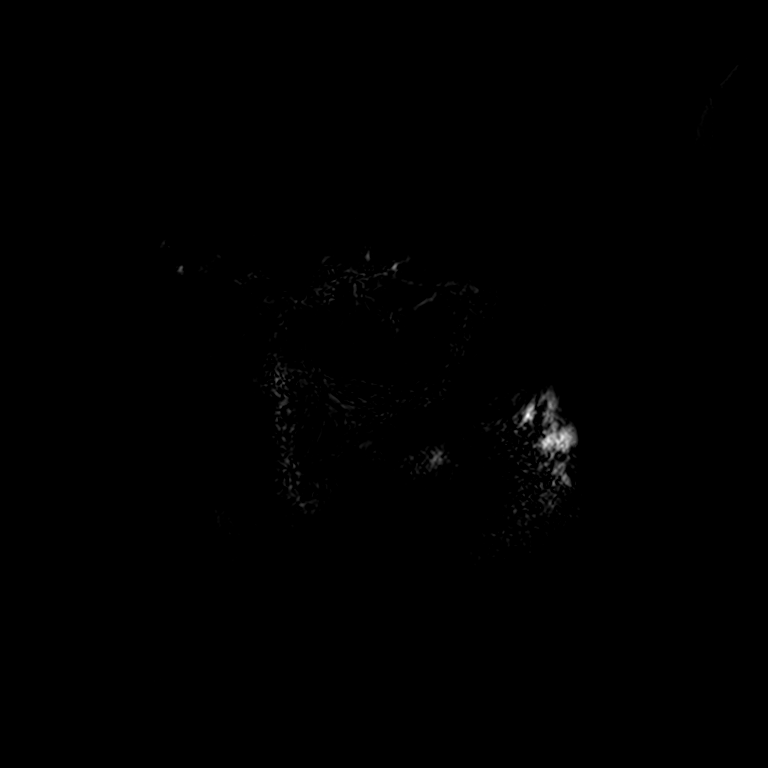
[im 32/64]
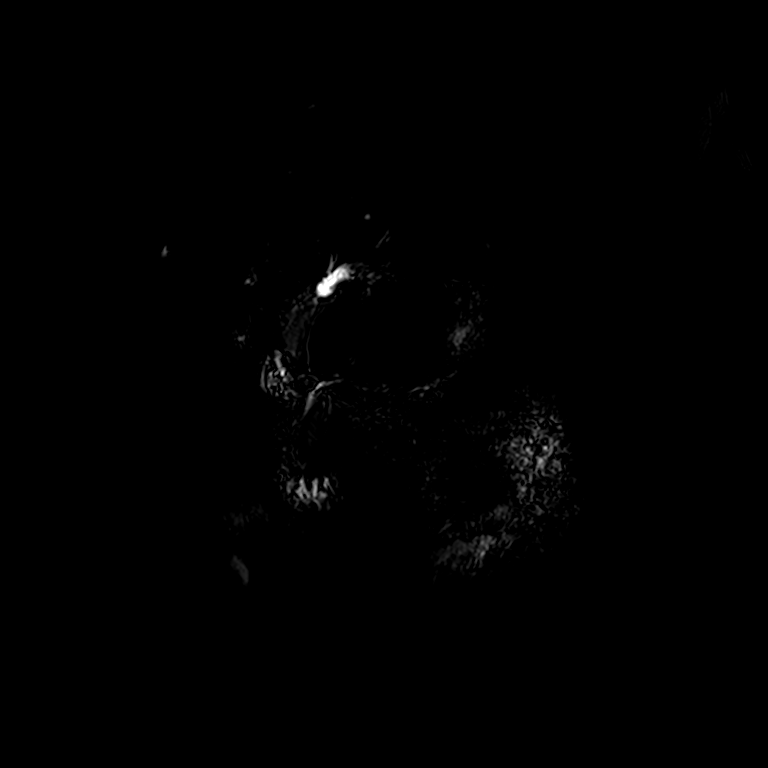
[im 64/64]
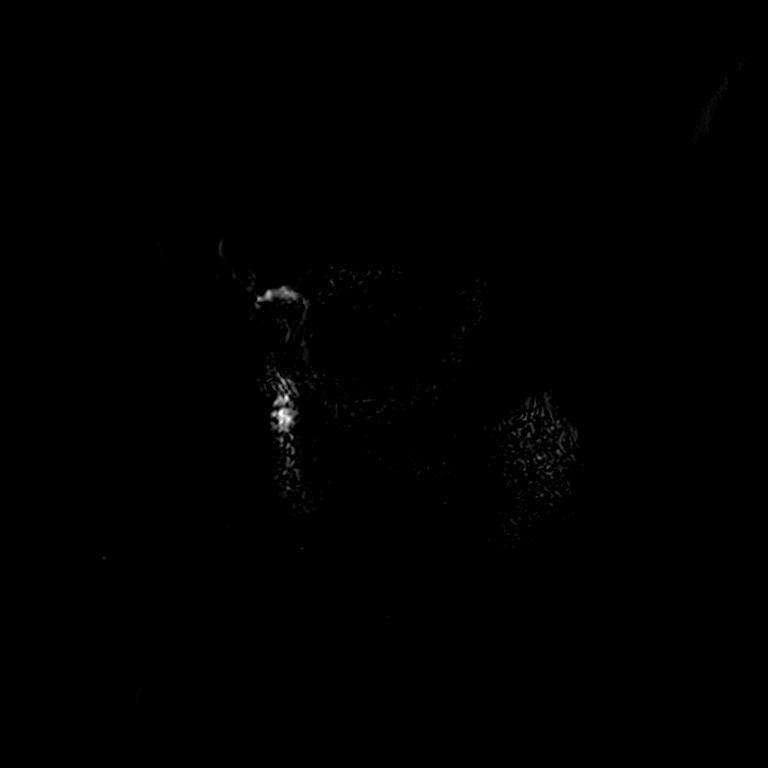

[Series 15: T2 · coronal · 3.0mm · 1.19mm/px · 1 of 19 slices shown (4 of 4)]
[im 1/19]
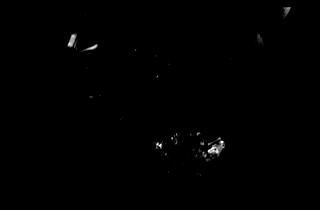

[Series 16: T1 dynamic · axial · non-contrast · 3.0mm · 1.25mm/px · z∈[-82,+95]mm · 3 of 60 slices shown]
[im 1/60]
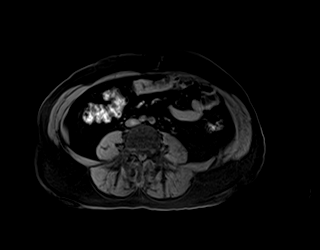
[im 30/60]
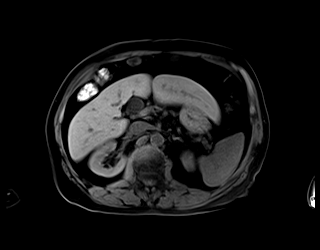
[im 60/60]
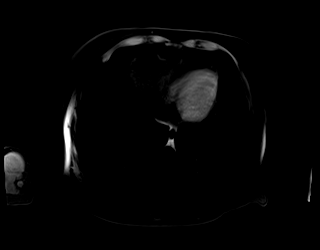

[Series 17: T1 dynamic post-contrast · axial · 3.0mm · 1.25mm/px · z∈[-82,+95]mm · 3 of 60 slices shown (1 of 7)]
[im 1/60]
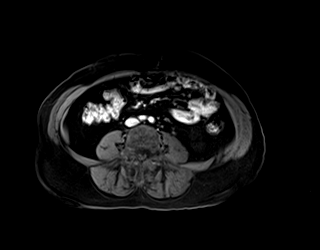
[im 30/60]
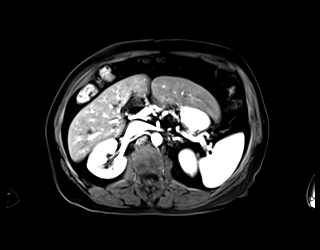
[im 60/60]
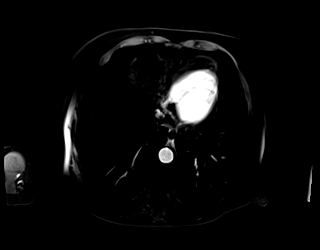

[Series 18: T1 dynamic post-contrast · axial · 3.0mm · 1.25mm/px · z∈[-82,+95]mm · 3 of 60 slices shown (2 of 7)]
[im 1/60]
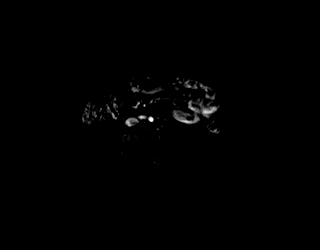
[im 30/60]
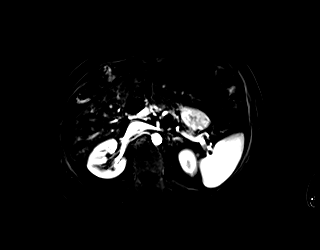
[im 60/60]
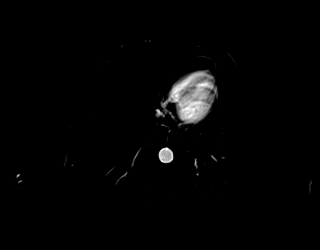

[Series 19: T1 dynamic post-contrast · axial · 3.0mm · 1.25mm/px · z∈[-82,+95]mm · 3 of 60 slices shown (3 of 7)]
[im 1/60]
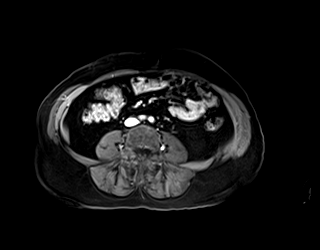
[im 30/60]
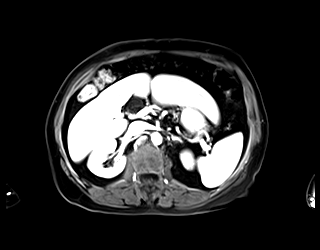
[im 60/60]
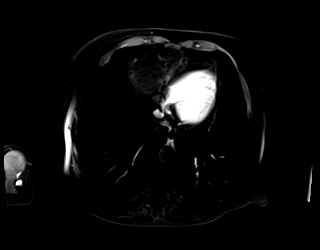

[Series 20: T1 dynamic post-contrast · axial · 3.0mm · 1.25mm/px · z∈[-82,+95]mm · 3 of 60 slices shown (4 of 7)]
[im 1/60]
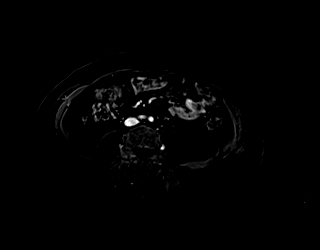
[im 30/60]
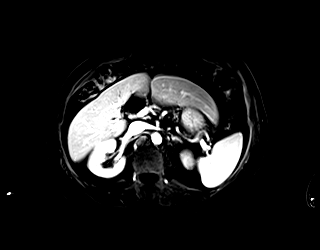
[im 60/60]
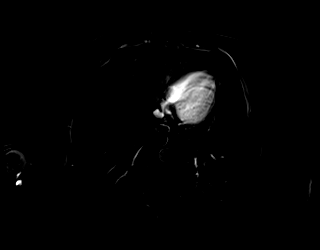

[Series 21: T1 dynamic post-contrast · axial · 3.0mm · 1.25mm/px · z∈[-82,+95]mm · 3 of 60 slices shown (5 of 7)]
[im 1/60]
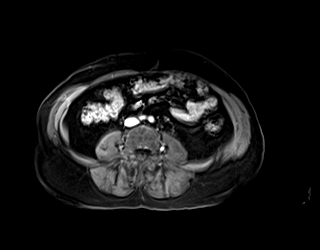
[im 30/60]
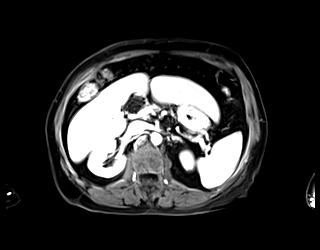
[im 60/60]
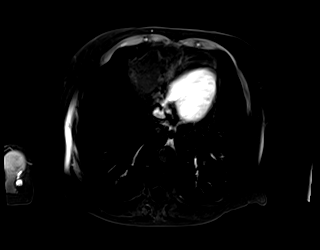

[Series 22: T1 dynamic post-contrast · axial · 3.0mm · 1.25mm/px · z∈[-82,+95]mm · 3 of 60 slices shown (6 of 7)]
[im 1/60]
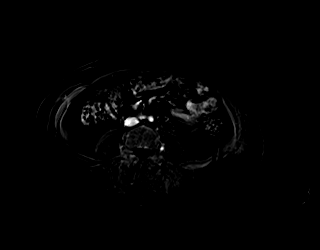
[im 30/60]
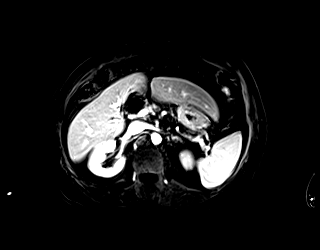
[im 60/60]
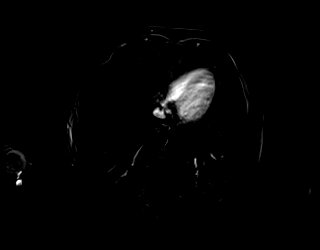

[Series 23: T1 dynamic post-contrast · coronal · 3.0mm · 1.25mm/px · 2 of 72 slices shown (7 of 7)]
[im 1/72]
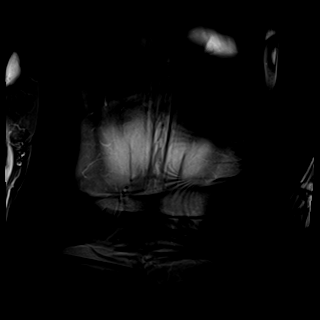
[im 36/72]
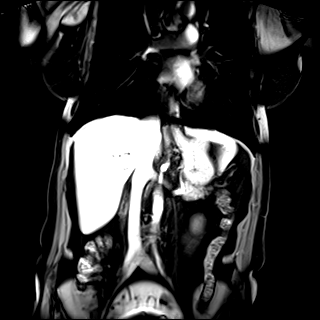

[41 of 48 positions shown; findings below may reference images not displayed]

FINDINGS: Lower chest: No acute findings.

Hepatobiliary: No suspicious enhancing liver lesion. Status post
cholecystectomy. Moderate intrahepatic bile duct dilatation is again
seen. There is marked fusiform dilatation of the CBD which measures
up to 2.0 cm proximally. Unchanged from 01/17/2022. On the remote
exam from 11/14/2017 the CBD measured 1.1 cm without significant
intrahepatic bile duct dilatation. No signs of choledocholithiasis.

Pancreas: No main duct dilatation or pancreatic inflammation
identified. No pancreatic mass identified.

Spleen:  Within normal limits in size and appearance.

Adrenals/Urinary Tract: No masses identified. No evidence of
hydronephrosis.

Stomach/Bowel: Visualized portions within the abdomen are
unremarkable.

Vascular/Lymphatic: Aortic atherosclerosis without aneurysm. Patent
upper abdominal vascularity. No enlarged upper abdominal lymph
nodes.

Other: There is no free fluid or fluid collections. Large midline
anterior diaphragmatic hernia is identified which contains fat only
on today's study. This appears similar to the CT from 11/14/2017.

Musculoskeletal: No suspicious bone lesions identified.
IMPRESSION: 1. No acute findings within the abdomen.
2. Moderate intrahepatic and extrahepatic bile duct dilatation
status post cholecystectomy. Stable from 01/17/2022 but progressed
from more remote study from 11/14/2017. Fusiform dilatation of the
CBD which measures up to 2.0 cm proximally, similar to 01/17/2022.
No signs of choledocholithiasis or obstructing mass noted. There are
no signs to suggest pancreatic ductal dilatation, inflammation or
mass.
3. Large midline anterior diaphragmatic hernia contains fat only on
today's study. This appears similar to CT from 01/17/2022.

## 2024-03-23 ENCOUNTER — Other Ambulatory Visit: Payer: Self-pay | Admitting: Orthopedic Surgery

## 2024-03-23 ENCOUNTER — Telehealth: Payer: Self-pay | Admitting: Orthopedic Surgery

## 2024-03-23 NOTE — Telephone Encounter (Signed)
 Pt called requesting pain medication. Pt states injection did not help for her knee. Pease send to pharmacy on file. Pt phone number [phone]

## 2024-03-23 NOTE — Telephone Encounter (Signed)
 Patient had prescription for tramadol  sent in 7 days ago by another provider

## 2024-03-31 ENCOUNTER — Other Ambulatory Visit: Payer: Self-pay | Admitting: Internal Medicine

## 2024-04-02 ENCOUNTER — Other Ambulatory Visit: Payer: Self-pay | Admitting: Internal Medicine

## 2024-04-04 ENCOUNTER — Telehealth: Payer: Self-pay | Admitting: Home Health

## 2024-04-04 MED ORDER — NITROGLYCERIN 0.4 MG SL SUBL: 0.4000 mg | SUBLINGUAL_TABLET | SUBLINGUAL | 0 refills | Status: AC | PRN

## 2024-04-04 NOTE — Telephone Encounter (Signed)
 Patient called after hour line today, states she ran out of her PRN nitroglycerin  and wants refill.   From chart review, she has not been physically seen since 05/2021. She had no significant CAD in the past workup, had some atypical chest pain. She states she is using Nitroglycerin  average once a month when her kids irritate her. She is a smoker.   Advised the patient that if she has significant chest pain, SOB, N/V, paresthesia, dizziness, syncope, etc, she should go to ER for ACS workup. If she feels overall stable with occasional chest pain that relieves by one dose Nitroglycerin , she can follow up with our clinic since she has not been seen for sometime. She agreed to come in on 05/03/24 with [facility] NP-C. Appointment made. Address provided over the phone. 7 tablets of PRN Nitroglycerin  refill x1 sent to [facility] pharmacy as courtesy.

## 2024-04-05 ENCOUNTER — Ambulatory Visit (INDEPENDENT_AMBULATORY_CARE_PROVIDER_SITE_OTHER)

## 2024-04-05 DIAGNOSIS — J309 Allergic rhinitis, unspecified: Secondary | ICD-10-CM

## 2024-04-12 ENCOUNTER — Ambulatory Visit (INDEPENDENT_AMBULATORY_CARE_PROVIDER_SITE_OTHER): Admitting: Orthopedic Surgery

## 2024-04-12 ENCOUNTER — Other Ambulatory Visit: Payer: Self-pay | Admitting: Internal Medicine

## 2024-04-12 ENCOUNTER — Ambulatory Visit

## 2024-04-12 DIAGNOSIS — M25571 Pain in right ankle and joints of right foot: Secondary | ICD-10-CM | POA: Diagnosis not present

## 2024-04-13 ENCOUNTER — Ambulatory Visit (INDEPENDENT_AMBULATORY_CARE_PROVIDER_SITE_OTHER)

## 2024-04-13 DIAGNOSIS — J4550 Severe persistent asthma, uncomplicated: Secondary | ICD-10-CM

## 2024-04-14 ENCOUNTER — Other Ambulatory Visit: Payer: Self-pay | Admitting: *Deleted

## 2024-04-14 ENCOUNTER — Telehealth: Payer: Self-pay | Admitting: Internal Medicine

## 2024-04-14 DIAGNOSIS — B009 Herpesviral infection, unspecified: Secondary | ICD-10-CM

## 2024-04-14 DIAGNOSIS — J4550 Severe persistent asthma, uncomplicated: Secondary | ICD-10-CM

## 2024-04-14 MED ORDER — LEVALBUTEROL TARTRATE 45 MCG/ACT IN AERO: 2.0000 | INHALATION_SPRAY | RESPIRATORY_TRACT | 2 refills | Status: DC | PRN

## 2024-04-14 MED ORDER — VALACYCLOVIR HCL 500 MG PO TABS: 500.0000 mg | ORAL_TABLET | Freq: Two times a day (BID) | ORAL | 5 refills | Status: DC

## 2024-04-14 NOTE — Telephone Encounter (Signed)
 Ok this was refilled due to hx of genital herpes

## 2024-04-14 NOTE — Addendum Note (Signed)
 Addended by: NORLEEN LYNWOOD ORN on: 04/14/2024 04:15 PM   Modules accepted: Orders

## 2024-04-14 NOTE — Telephone Encounter (Signed)
 Copied from CRM [id number]. Topic: Clinical - Medication Question >> Apr 14, 2024 12:31 PM Franky GRADE wrote: Reason for CRM: Patient is calling because the pharmacy informed her that the refill request for valACYclovir  (VALTREX ) 1000 MG tablet was denied and she would like to know why.

## 2024-04-20 ENCOUNTER — Encounter: Payer: Self-pay | Admitting: Orthopedic Surgery

## 2024-04-20 NOTE — Progress Notes (Signed)
 Office Visit Note   Patient: Lori [name]           Date of Birth: 07-18-1956           MRN: [id number] Visit Date: 04/12/2024              Requested by: Norleen Lynwood ORN, MD [address] [place],  [place] [address] PCP: Norleen Lynwood ORN, MD  Chief Complaint  Patient presents with   Right Ankle - Pain      HPI: Patient is a 68 year old woman who presents with global pain around the right ankle she has swelling and pain with ambulation.  Denies any acute injury.  Patient seen in referral from Dr. Addie.  Patient states she is currently on tramadol  and Neurontin  as needed.  Pain worse in the right ankle than the left ankle.  Assessment & Plan: Visit Diagnoses:  1. Pain in right ankle and joints of right foot     Plan: Recommended Achilles stretching and recommended topical Voltaren  gel 3 times a day as needed.  Follow-Up Instructions: Return if symptoms worsen or fail to improve.   Ortho Exam  Patient is alert, oriented, no adenopathy, well-dressed, normal affect, normal respiratory effort. Examination radiographs of both ankles show a congruent mortise.  There are no cystic changes.  Patient has pain to palpation over the fibula and the entire foot.  She has a palpable pulse and has pain when palpating a pulse.  She has global pain to palpation over all tendons and pain with light palpation only.  She has ankle dorsiflexion to neutral.    Imaging: No results found. No images are attached to the encounter.  Labs: Lab Results  Component Value Date   HGBA1C 5.4 12/29/2023   HGBA1C 5.3 04/09/2023   HGBA1C 5.3 01/10/2022   ESRSEDRATE 18 06/30/2017   ESRSEDRATE 17 10/06/2012   ESRSEDRATE 21 12/08/2007   CRP 0.5 06/30/2017   CRP 1.5 10/06/2012   REPTSTATUS 01/20/2024 FINAL 01/15/2024   CULT  01/15/2024    NO GROWTH 5 DAYS Performed at [facility], [address], [place], [place] [address]    LABORGA NO GROWTH 06/21/2016     Lab Results  Component Value  Date   ALBUMIN 3.8 01/23/2024   ALBUMIN 4.0 01/23/2024   ALBUMIN 4.4 01/15/2024    Lab Results  Component Value Date   MG 2.1 01/18/2024   MG 1.7 06/30/2023   MG 2.1 05/16/2023   Lab Results  Component Value Date   VD25OH 51.02 01/16/2024   VD25OH 41.11 12/29/2023   VD25OH 13.50 (L) 04/09/2023    No results found for: PREALBUMIN    Latest Ref Rng & Units 01/23/2024    7:35 PM 01/23/2024   10:09 AM 01/18/2024    6:39 AM  CBC EXTENDED  WBC 4.0 - 10.5 K/uL 18.6  19.8 Repeated and verified X2.  11.0   RBC 3.87 - 5.11 MIL/uL 4.38  4.54  4.34   Hemoglobin 12.0 - 15.0 g/dL 86.2  86.1  86.5   HCT 36.0 - 46.0 % 40.0  41.8  41.6   Platelets 150 - 400 K/uL 360  384.0  269   NEUT# 1.7 - 7.7 K/uL 9.7  12.7  9.0   Lymph# 0.7 - 4.0 K/uL 7.4  5.5  1.3      There is no height or weight on file to calculate BMI.  Orders:  No orders of the defined types were placed in  this encounter.  No orders of the defined types were placed in this encounter.    Procedures: No procedures performed  Clinical Data: No additional findings.  ROS:  All other systems negative, except as noted in the HPI. Review of Systems  Objective: Vital Signs: There were no vitals taken for this visit.  Specialty Comments:  No specialty comments available.  PMFS History: Patient Active Problem List   Diagnosis Date Noted   Diarrhea 02/03/2024   Leucocytosis 01/23/2024   Asthma, chronic obstructive, with acute exacerbation (HCC) 01/15/2024   Lower back pain 12/29/2023   Osteoporosis 12/29/2023   Chest pain 06/08/2023   Hypomagnesemia 05/15/2023   Upper abdominal pain 09/19/2022   Constipation 09/19/2022   Lower abdominal pain 06/25/2022   COVID-19 virus infection 06/09/2022   Pain in left ankle and joints of left foot 05/15/2022   Weight loss 01/10/2022   Arthritis of carpometacarpal (CMC) joint of right thumb 10/25/2021   Right-sided chest pain 09/05/2021   Neck pain 04/11/2021   Thoracic  spine pain 04/11/2021   Low back pain 04/11/2021   Bilateral hip pain 04/11/2021   AKI (acute kidney injury) (HCC) 04/11/2021   Allergic rhinitis 01/08/2021   Abnormal urine odor 11/14/2020   Aortic atherosclerosis (HCC) 11/14/2020   Severe persistent asthma without complication 11/02/2020   Hymenoptera allergy 11/02/2020   Genital herpes 11/12/2019   Coccyx pain 11/12/2019   Insomnia 11/12/2019   Acute right-sided low back pain without sciatica 07/20/2019   Grief 07/20/2019   Vitamin D  deficiency 07/20/2019   Hyperglycemia 04/21/2019   Left knee pain 04/21/2019   Pain 12/29/2018   B12 deficiency 10/20/2018   Bilateral hand pain 08/03/2018   Nausea 04/27/2018   Anemia, iron  deficiency 04/27/2018   Intermittent paresthesia of hand and foot 04/27/2018   Headache 03/21/2018   Lumbar radiculitis 08/15/2017   Degenerative disc disease, lumbar 07/21/2017   Encounter for well adult exam with abnormal findings 06/30/2017   Epigastric pain 06/30/2017   Restrictive lung disease 06/30/2017   Leg cramping 06/09/2017   Elbow contusion 06/09/2017   Right elbow pain 05/27/2017   Asthma-COPD overlap syndrome (HCC) 01/31/2017   Seasonal and perennial allergic rhinitis 01/01/2017   Moderate persistent asthma, uncomplicated 01/01/2017   Pulmonary emphysema (HCC) 12/03/2016   Tobacco abuse 12/03/2016   Non-seasonal allergic rhinitis due to fungal spores 12/03/2016   Medication management 11/29/2016   Rash 11/23/2016   Gastroesophageal reflux disease    Community acquired pneumonia of left lower lobe of lung 10/26/2016   Vertigo 10/22/2016   Gastroesophageal reflux disease with esophagitis 10/22/2016   Non compliance w medication regimen 08/01/2016   Allergic contact dermatitis due to metals 08/01/2016   Migraine without aura and without status migrainosus, not intractable 07/22/2016   Spasm of muscle of lower back 06/21/2016   Chronic rhinitis 06/19/2016   Compliance poor 06/19/2016    Gait difficulty 06/21/2015   Fibromyalgia 06/21/2015   Conversion reaction 06/21/2015   Anxiety and depression 06/21/2015   Syncope and collapse 06/21/2015   Plica syndrome of left knee 01/06/2015   Anxiety state 09/06/2014   History of MI 09/06/2014   Cigarette smoker 06/27/2013   Borderline hypertension 06/27/2013   Chronic low back pain with sciatica 06/14/2013   Leg pain, bilateral 06/14/2013   Gastroparesis 10/26/2009   Musculoskeletal chest pain 05/02/2009   Hyperlipidemia LDL goal <100 11/18/2008   COPD exacerbation (HCC) 11/18/2008   HEPATIC CYST 11/18/2008   Past Medical History:  Diagnosis Date  Allergic rhinitis    Anemia    hgb 13.9 on 11/22/21   Ankle fracture 05/2016   Anxiety    Arthritis    Asthma    Barrett esophagus 2007   Chest pain    Hx, no current problems as of 11/22/21.  Recurrent episodes.  Has had 3 negative nuclear stress test, and a completely normal coronary CT angiogram   COPD (chronic obstructive pulmonary disease) with chronic bronchitis (HCC)    & Emphysema   Depression    Duodenitis without mention of hemorrhage 2007   Esophageal reflux 2007   Esophageal stricture    Full dentures    patient does not wear dentures as of 11/22/21   H/O hiatal hernia    Hiatal hernia 2006,2007   HSV infection    Hyperlipidemia    rx crestor  but pt not taking   Multiple fractures    from falls, fx rt. elbow, fx left wrist, bilateral ankles   Pneumonia 10/2016   Restrictive lung disease 06/30/2017   Ulcer    no current problems as of 11/22/21    Family History  Problem Relation Age of Onset   Emphysema Father    Dementia Mother    Diabetes Maternal Grandmother    Diabetes Maternal Aunt    Diabetes Other        mat. cousin   Heart disease Brother    Colon cancer Neg Hx    Allergic rhinitis Neg Hx    Angioedema Neg Hx    Asthma Neg Hx    Atopy Neg Hx    Eczema Neg Hx    Immunodeficiency Neg Hx    Urticaria Neg Hx     Past Surgical History:   Procedure Laterality Date   ABDOMINAL HYSTERECTOMY     BSO   APPENDECTOMY     BIOPSY  04/04/2022   Procedure: BIOPSY;  Surgeon: Wilhelmenia, Aloha Raddle., MD;  Location: [facility];  Service: Gastroenterology;;   CESAREAN SECTION     x 2   CHOLECYSTECTOMY     CHONDROPLASTY Left 01/06/2015   Procedure: CHONDROPLASTY;  Surgeon: Oneil JAYSON Herald, MD;  Location: [facility];  Service: Orthopedics;  Laterality: Left;   COLONOSCOPY     CT CTA CORONARY W/CA SCORE W/CM &/OR WO/CM  12/02/2016   CORONARY CALCIUM  SCORE 0.  NO EVIDENCE OF CAD.  Normal-sized PA.  Normal pulmonary venous drainage the left atrium.  No ASD or VSD.   ELBOW FRACTURE SURGERY     right   ESOPHAGOGASTRODUODENOSCOPY (EGD) WITH PROPOFOL  N/A 04/04/2022   Procedure: ESOPHAGOGASTRODUODENOSCOPY (EGD) WITH PROPOFOL ;  Surgeon: Wilhelmenia Aloha Raddle., MD;  Location: [facility];  Service: Gastroenterology;  Laterality: N/A;   EUS N/A 04/04/2022   Procedure: UPPER ENDOSCOPIC ULTRASOUND (EUS) RADIAL;  Surgeon: Wilhelmenia Aloha Raddle., MD;  Location: [facility];  Service: Gastroenterology;  Laterality: N/A;   EYE SURGERY Bilateral    cataracts removed   INSERTION OF MESH N/A 11/12/2022   Procedure: INSERTION OF MESH;  Surgeon: Rubin Calamity, MD;  Location: [facility];  Service: General;  Laterality: N/A;   KNEE ARTHROSCOPY WITH EXCISION PLICA Left 01/06/2015   Procedure: KNEE ARTHROSCOPY WITH EXCISION PLICA;  Surgeon: Oneil JAYSON Herald, MD;  Location: [facility];  Service: Orthopedics;  Laterality: Left;   LIGAMENT REPAIR Right 11/28/2021   Procedure: RIGHT THUMB LIGAMENT RECONSTRUCTION AND TENDON INTERPOSITION;  Surgeon: Romona Harari, MD;  Location: [facility];  Service: Orthopedics;  Laterality: Right;   Lower Extremity  Arterial Dopplers  07/06/2013   RABI 1.0, LABI 1.1.; No evidence of significant vascular atherogenic plaque   NECK SURGERY     NM MYOVIEW  LTD  09/2014   a) LOW RISK. Normal EF of 65%, no RWMA. NO  ISCHEMIA OR INFARCTION.;; b) 05/02/2009: Normal study.  LOW RISK.  No ischemia or infarction.  EF 74%.  Breast attenuation noted.   TONSILLECTOMY     TRANSTHORACIC ECHOCARDIOGRAM  03/28/2021   a) Normal EF 60 to 65%.  GR 1 DD.  No R WMA.  Normal RV size and function.  Normal RAP.  Normal valves. => NORMAL;; b) 04/23/16/2010: Normal study.  LOW RISK.  No ischemia or infarction.  EF 74%.  Breast attenuation noted.17/2010: ([facility]) EF>55%.  Normal wall motion.  Normal valves.   WRIST FRACTURE SURGERY     left, has plate   XI ROBOTIC ASSISTED PARAESOPHAGEAL HERNIA REPAIR N/A 11/12/2022   Procedure: ROBOTIC DIAPHRAGMATIC HERNIA REPAIR WITH MESH;  Surgeon: Rubin Calamity, MD;  Location: [facility] OR;  Service: General;  Laterality: N/A;   Social History   Occupational History   Occupation: disable  Tobacco Use   Smoking status: Every Day    Current packs/day: 0.50    Average packs/day: 0.5 packs/day for 46.7 years (23.4 ttl pk-yrs)    Types: Cigarettes    Start date: 08/06/1977   Smokeless tobacco: Never   Tobacco comments:    Patient has cut back to 2-3 per day as of 11/22/21  Vaping Use   Vaping status: Never Used  Substance and Sexual Activity   Alcohol use: No    Alcohol/week: 0.0 standard drinks of alcohol   Drug use: No   Sexual activity: Not Currently    Birth control/protection: Surgical    Comment: Hysterectomy

## 2024-04-29 ENCOUNTER — Ambulatory Visit (INDEPENDENT_AMBULATORY_CARE_PROVIDER_SITE_OTHER)

## 2024-04-29 ENCOUNTER — Encounter: Payer: Self-pay | Admitting: Internal Medicine

## 2024-04-29 ENCOUNTER — Ambulatory Visit (INDEPENDENT_AMBULATORY_CARE_PROVIDER_SITE_OTHER): Admitting: Internal Medicine

## 2024-04-29 ENCOUNTER — Ambulatory Visit: Payer: Self-pay | Admitting: Internal Medicine

## 2024-04-29 VITALS — BP 118/82 | HR 75 | Temp 97.7°F | Ht 64.0 in | Wt 125.0 lb

## 2024-04-29 DIAGNOSIS — E538 Deficiency of other specified B group vitamins: Secondary | ICD-10-CM

## 2024-04-29 DIAGNOSIS — R062 Wheezing: Secondary | ICD-10-CM | POA: Diagnosis not present

## 2024-04-29 DIAGNOSIS — R739 Hyperglycemia, unspecified: Secondary | ICD-10-CM

## 2024-04-29 DIAGNOSIS — R059 Cough, unspecified: Secondary | ICD-10-CM | POA: Diagnosis not present

## 2024-04-29 DIAGNOSIS — E559 Vitamin D deficiency, unspecified: Secondary | ICD-10-CM

## 2024-04-29 DIAGNOSIS — J441 Chronic obstructive pulmonary disease with (acute) exacerbation: Secondary | ICD-10-CM | POA: Diagnosis not present

## 2024-04-29 DIAGNOSIS — R0602 Shortness of breath: Secondary | ICD-10-CM | POA: Diagnosis not present

## 2024-04-29 LAB — BASIC METABOLIC PANEL WITH GFR
BUN: 9 mg/dL (ref 6–23)
CO2: 27 meq/L (ref 19–32)
Calcium: 8.7 mg/dL (ref 8.4–10.5)
Chloride: 104 meq/L (ref 96–112)
Creatinine, Ser: 1.08 mg/dL (ref 0.40–1.20)
GFR: 53.07 mL/min — ABNORMAL LOW (ref >60.00)
Glucose, Bld: 72 mg/dL (ref 70–99)
Potassium: 3.7 meq/L (ref 3.5–5.1)
Sodium: 138 meq/L (ref 135–145)

## 2024-04-29 LAB — HEMOGLOBIN A1C: Hgb A1c MFr Bld: 5.8 % (ref 4.6–6.5)

## 2024-04-29 LAB — CBC WITH DIFFERENTIAL/PLATELET
Basophils Absolute: 0 K/uL (ref 0.0–0.1)
Basophils Relative: 0.1 % (ref 0.0–3.0)
Eosinophils Absolute: 0 K/uL (ref 0.0–0.7)
Eosinophils Relative: 0 % (ref 0.0–5.0)
HCT: 38.2 % (ref 36.0–46.0)
Hemoglobin: 12.8 g/dL (ref 12.0–15.0)
Lymphocytes Relative: 36 % (ref 12.0–46.0)
Lymphs Abs: 2.7 K/uL (ref 0.7–4.0)
MCHC: 33.4 g/dL (ref 30.0–36.0)
MCV: 85.9 fl (ref 78.0–100.0)
Monocytes Absolute: 0.5 K/uL (ref 0.1–1.0)
Monocytes Relative: 6.9 % (ref 3.0–12.0)
Neutro Abs: 4.2 K/uL (ref 1.4–7.7)
Neutrophils Relative %: 57 % (ref 43.0–77.0)
Platelets: 284 K/uL (ref 150.0–400.0)
RBC: 4.45 Mil/uL (ref 3.87–5.11)
RDW: 14.5 % (ref 11.5–15.5)
WBC: 7.4 K/uL (ref 4.0–10.5)

## 2024-04-29 LAB — HEPATIC FUNCTION PANEL
ALT: 8 U/L (ref 0–35)
AST: 12 U/L (ref 0–37)
Albumin: 4.3 g/dL (ref 3.5–5.2)
Alkaline Phosphatase: 89 U/L (ref 39–117)
Bilirubin, Direct: 0 mg/dL (ref 0.0–0.3)
Total Bilirubin: 0.3 mg/dL (ref 0.2–1.2)
Total Protein: 6.5 g/dL (ref 6.0–8.3)

## 2024-04-29 MED ORDER — AMOXICILLIN-POT CLAVULANATE 875-125 MG PO TABS: 1.0000 | ORAL_TABLET | Freq: Two times a day (BID) | ORAL | 0 refills | Status: DC

## 2024-04-29 MED ORDER — PREDNISONE 10 MG PO TABS: ORAL_TABLET | ORAL | 0 refills | Status: DC

## 2024-04-29 MED ORDER — METHYLPREDNISOLONE ACETATE 80 MG/ML IJ SUSP
80.0000 mg | Freq: Once | INTRAMUSCULAR | Status: AC
  Administered 2024-04-29: 80 mg via INTRAMUSCULAR

## 2024-04-29 MED ORDER — HYDROCODONE BIT-HOMATROP MBR 5-1.5 MG/5ML PO SOLN: 5.0000 mL | Freq: Four times a day (QID) | ORAL | 0 refills | Status: AC | PRN

## 2024-04-29 NOTE — Patient Instructions (Addendum)
 You had the steroid shot today  Please take all new medication as prescribed - the antibiotic, cough medicine, prednisone   Please continue all other medications as before, including the inhaler  Please have the pharmacy call with any other refills you may need.  Please keep your appointments with your specialists as you may have planned  Please go to the XRAY Department in the first floor for the x-ray testing  Please go to the LAB at the blood drawing area for the tests to be done  You will be contacted by phone if any changes need to be made immediately.  Otherwise, you will receive a letter about your results with an explanation, but please check with MyChart first.  Please make an Appointment to return in 6 months, or sooner if needed

## 2024-04-29 NOTE — Progress Notes (Signed)
 Patient ID: Lori [name], female   DOB: 12-26-1955, 68 y.o.   MRN: [id number]        Chief Complaint: follow up cough, wheezing, hyperglycemia, b12 low       HPI:  Lori [name] is a 68 y.o. female Here with acute onset mild to mod 4 days ST, HA, general weakness and malaise, with prod cough greenish sputum with wheezing and sob, doe, but Pt denies chest pain, orthopnea, PND, increased LE swelling, palpitations, dizziness or syncope.   Pt denies polydipsia, polyuria, or new focal neuro s/s.    Pt denies fever, wt loss, night sweats, loss of appetite, or other constitutional symptoms         Wt Readings from Last 3 Encounters:  04/29/24 125 lb (56.7 kg)  02/03/24 117 lb (53.1 kg)  01/23/24 115 lb (52.2 kg)   BP Readings from Last 3 Encounters:  04/29/24 118/82  02/03/24 122/78  01/24/24 113/64         Past Medical History:  Diagnosis Date   Allergic rhinitis    Anemia    hgb 13.9 on 11/22/21   Ankle fracture 05/2016   Anxiety    Arthritis    Asthma    Barrett esophagus 2007   Chest pain    Hx, no current problems as of 11/22/21.  Recurrent episodes.  Has had 3 negative nuclear stress test, and a completely normal coronary CT angiogram   COPD (chronic obstructive pulmonary disease) with chronic bronchitis (HCC)    & Emphysema   Depression    Duodenitis without mention of hemorrhage 2007   Esophageal reflux 2007   Esophageal stricture    Full dentures    patient does not wear dentures as of 11/22/21   H/O hiatal hernia    Hiatal hernia 2006,2007   HSV infection    Hyperlipidemia    rx crestor  but pt not taking   Multiple fractures    from falls, fx rt. elbow, fx left wrist, bilateral ankles   Pneumonia 10/2016   Restrictive lung disease 06/30/2017   Ulcer    no current problems as of 11/22/21   Past Surgical History:  Procedure Laterality Date   ABDOMINAL HYSTERECTOMY     BSO   APPENDECTOMY     BIOPSY  04/04/2022   Procedure: BIOPSY;  Surgeon: Wilhelmenia Aloha Raddle.,  MD;  Location: [facility] ENDOSCOPY;  Service: Gastroenterology;;   CESAREAN SECTION     x 2   CHOLECYSTECTOMY     CHONDROPLASTY Left 01/06/2015   Procedure: CHONDROPLASTY;  Surgeon: Oneil JAYSON Herald, MD;  Location: [facility];  Service: Orthopedics;  Laterality: Left;   COLONOSCOPY     CT CTA CORONARY W/CA SCORE W/CM &/OR WO/CM  12/02/2016   CORONARY CALCIUM  SCORE 0.  NO EVIDENCE OF CAD.  Normal-sized PA.  Normal pulmonary venous drainage the left atrium.  No ASD or VSD.   ELBOW FRACTURE SURGERY     right   ESOPHAGOGASTRODUODENOSCOPY (EGD) WITH PROPOFOL  N/A 04/04/2022   Procedure: ESOPHAGOGASTRODUODENOSCOPY (EGD) WITH PROPOFOL ;  Surgeon: Wilhelmenia Aloha Raddle., MD;  Location: [facility] ENDOSCOPY;  Service: Gastroenterology;  Laterality: N/A;   EUS N/A 04/04/2022   Procedure: UPPER ENDOSCOPIC ULTRASOUND (EUS) RADIAL;  Surgeon: Wilhelmenia Aloha Raddle., MD;  Location: [facility] ENDOSCOPY;  Service: Gastroenterology;  Laterality: N/A;   EYE SURGERY Bilateral    cataracts removed   INSERTION OF MESH N/A 11/12/2022   Procedure: INSERTION OF MESH;  Surgeon: Rubin Calamity, MD;  Location: [facility]  OR;  Service: General;  Laterality: N/A;   KNEE ARTHROSCOPY WITH EXCISION PLICA Left 01/06/2015   Procedure: KNEE ARTHROSCOPY WITH EXCISION PLICA;  Surgeon: Oneil JAYSON Herald, MD;  Location: [facility];  Service: Orthopedics;  Laterality: Left;   LIGAMENT REPAIR Right 11/28/2021   Procedure: RIGHT THUMB LIGAMENT RECONSTRUCTION AND TENDON INTERPOSITION;  Surgeon: Romona Harari, MD;  Location: [facility] OR;  Service: Orthopedics;  Laterality: Right;   Lower Extremity Arterial Dopplers  07/06/2013   RABI 1.0, LABI 1.1.; No evidence of significant vascular atherogenic plaque   NECK SURGERY     NM MYOVIEW  LTD  09/2014   a) LOW RISK. Normal EF of 65%, no RWMA. NO ISCHEMIA OR INFARCTION.;; b) 05/02/2009: Normal study.  LOW RISK.  No ischemia or infarction.  EF 74%.  Breast attenuation noted.   TONSILLECTOMY      TRANSTHORACIC ECHOCARDIOGRAM  03/28/2021   a) Normal EF 60 to 65%.  GR 1 DD.  No R WMA.  Normal RV size and function.  Normal RAP.  Normal valves. => NORMAL;; b) 04/23/16/2010: Normal study.  LOW RISK.  No ischemia or infarction.  EF 74%.  Breast attenuation noted.17/2010: ([facility]) EF>55%.  Normal wall motion.  Normal valves.   WRIST FRACTURE SURGERY     left, has plate   XI ROBOTIC ASSISTED PARAESOPHAGEAL HERNIA REPAIR N/A 11/12/2022   Procedure: ROBOTIC DIAPHRAGMATIC HERNIA REPAIR WITH MESH;  Surgeon: Rubin Calamity, MD;  Location: [facility] OR;  Service: General;  Laterality: N/A;    reports that she has been smoking cigarettes. She started smoking about 46 years ago. She has a 23.4 pack-year smoking history. She has never used smokeless tobacco. She reports that she does not drink alcohol and does not use drugs. family history includes Dementia in her mother; Diabetes in her maternal aunt, maternal grandmother, and another family member; Emphysema in her father; Heart disease in her brother. Allergies  Allergen Reactions   Advil  [Ibuprofen ] Nausea And Vomiting   Bee Venom Swelling   Codeine  Nausea Only and Other (See Comments)    CAUSES ULCERS   Clonidine Derivatives Other (See Comments)    Unknown    Doxycycline  Nausea Only   Other Other (See Comments)    Pet dander and mold: Takes shots to address these   Statins Other (See Comments)    unknown   Latex Rash   Levaquin  [Levofloxacin ] Nausea Only   Propoxyphene N-Acetaminophen  Nausea Only   Current Outpatient Medications on File Prior to Visit  Medication Sig Dispense Refill   AIMOVIG 140 MG/ML SOAJ Inject 140 mg into the skin every 30 (thirty) days.     albuterol  (VENTOLIN  HFA) 108 (90 Base) MCG/ACT inhaler INHALE 2 PUFFS INTO LUNGS EVERY 6 HOURS AS NEEDED FOR WHEEZING OR SHORTNESS OF BREATH *NEW PRESCRIPTION REQUEST* 8.5 g 11   ALPRAZolam  (XANAX ) 1 MG tablet TAKE 1 TABLET BY MOUTH AT BEDTIME AS NEEDED FOR ANXIETY 90 tablet 1   benzonatate   (TESSALON ) 100 MG capsule Take 1 capsule (100 mg total) by mouth 3 (three) times daily. 90 capsule 1   budeson-glycopyrrolate -formoterol  (BREZTRI  AEROSPHERE) 160-9-4.8 MCG/ACT AERO inhaler Inhale 2 puffs into the lungs 2 (two) times daily.     busPIRone  (BUSPAR ) 10 MG tablet Take 1 tablet (10 mg total) by mouth 2 (two) times daily. 60 tablet 2   cetirizine  (ZYRTEC  ALLERGY) 10 MG tablet take1 tablet by mouth once a day as needed for runny nose/drainage down throat (Patient taking differently: Take 10 mg by mouth  in the morning.) 30 tablet 3   cyclobenzaprine  (FLEXERIL ) 5 MG tablet TAKE 1 TABLET BY MOUTH 3 TIMES DAILY AS NEEDED FOR MUSCLE SPASMS 40 tablet 11   dexlansoprazole  (DEXILANT ) 60 MG capsule TAKE 1 CAPSULE(S) BY MOUTH 1 TIMES PER DAY *NEW PRESCRIPTION REQUEST* 90 capsule 3   dextromethorphan -guaiFENesin  (MUCINEX  DM) 30-600 MG 12hr tablet Take 1 tablet by mouth 2 (two) times daily. 20 tablet 0   diphenoxylate -atropine  (LOMOTIL ) 2.5-0.025 MG tablet Take 1 tablet by mouth 4 (four) times daily as needed for diarrhea or loose stools. 30 tablet 0   EPINEPHRINE  0.3 mg/0.3 mL IJ SOAJ injection INJECT 0.3MG  INTRAMUSCULARLY AS NEEDED FOR ANAPHYLAXIS. DO NOT REFRIGERATE 2 each 11   FASENRA  30 MG/ML prefilled syringe INJECT 30MG  SUBCUTANEOUSLY EVERY 8 WEEKS 1 mL 6   FLUoxetine  (PROZAC ) 20 MG capsule TAKE 1 CAPSULE BY MOUTH ONCE DAILY *NEW PRESCRIPTION REQUEST* 90 capsule 3   gabapentin  (NEURONTIN ) 600 MG tablet Take 600 mg by mouth daily in the afternoon.     levalbuterol  (XOPENEX  HFA) 45 MCG/ACT inhaler Inhale 2 puffs into the lungs every 4 (four) hours as needed for wheezing or shortness of breath. 15 g 2   meclizine  (ANTIVERT ) 12.5 MG tablet TAKE 1 TABLET BY MOUTH THREE TIMES DAILY AS NEEDED FOR DIZZINESS 60 tablet 0   montelukast  (SINGULAIR ) 10 MG tablet TAKE 1 TABLET BY MOUTH AT BEDTIME *NEW PRESCRIPTION REQUEST* 90 tablet 3   nitroGLYCERIN  (NITROSTAT ) 0.4 MG SL tablet Place 1 tablet (0.4 mg total)  under the tongue every 5 (five) minutes as needed for chest pain. 25 tablet 3   nitroGLYCERIN  (NITROSTAT ) 0.4 MG SL tablet Place 1 tablet (0.4 mg total) under the tongue every 5 (five) minutes as needed for chest pain. 7 tablet 0   ondansetron  (ZOFRAN ) 4 MG tablet Take 1 tablet (4 mg total) by mouth every 8 (eight) hours as needed for nausea or vomiting. 20 tablet 0   promethazine  (PHENERGAN ) 12.5 MG tablet TAKE 1 TABLET BY MOUTH EVERY 8 HOURS AS NEEDED FOR NAUSEA FOR VOMITING *NEW PRESCRIPTION REQUEST* 270 tablet 3   risedronate  (ACTONEL ) 35 MG tablet Take 1 tablet (35 mg total) by mouth every 7 (seven) days. with water  on empty stomach, nothing by mouth or lie down for next 30 minutes. 12 tablet 3   tiZANidine  (ZANAFLEX ) 2 MG tablet TAKE 1 TABLET BY MOUTH EVERY 8 HOURS AS NEEDED FOR MUSCLE SPASM *NEW PRESCRIPTION REQUEST* 270 tablet 3   traMADol  (ULTRAM ) 50 MG tablet TAKE 1 TABLET BY MOUTH EVERY 6 HOURS AS NEEDED 60 tablet 2   valACYclovir  (VALTREX ) 500 MG tablet Take 1 tablet (500 mg total) by mouth 2 (two) times daily. 14 tablet 5   zolpidem  (AMBIEN ) 5 MG tablet Take 1 tablet (5 mg total) by mouth at bedtime as needed for sleep. 90 tablet 1   Current Facility-Administered Medications on File Prior to Visit  Medication Dose Route Frequency Provider Last Rate Last Admin   benralizumab  (FASENRA ) prefilled syringe 30 mg  30 mg Subcutaneous Q8 Weeks Gallagher, Joel Louis, MD   30 mg at 04/13/24 1025        ROS:  All others reviewed and negative.  Objective        PE:  BP 118/82 (BP Location: Left Arm, Patient Position: Sitting, Cuff Size: Normal)   Pulse 75   Temp 97.7 F (36.5 C) (Oral)   Ht 5' 4 (1.626 m)   Wt 125 lb (56.7 kg)   SpO2 99%  BMI 21.46 kg/m                 Constitutional: Pt appears mild ill, no accessory muscle contractions               HENT: Head: NCAT.                Right Ear: External ear normal.                 Left Ear: External ear normal.                Eyes:  . Pupils are equal, round, and reactive to light. Conjunctivae and EOM are normal               Nose: without d/c or deformity               Neck: Neck supple. Gross normal ROM               Cardiovascular: Normal rate and regular rhythm.                 Pulmonary/Chest: Effort normal and breath sounds without rales but bilateral wheezing.                           Neurological: Pt is alert. At baseline orientation, motor grossly intact               Skin: Skin is warm. No rashes, no other new lesions, LE edema - none               Psychiatric: Pt behavior is normal without agitation   Micro: none  Cardiac tracings I have personally interpreted today:  none  Pertinent Radiological findings (summarize): none   Lab Results  Component Value Date   WBC 7.4 04/29/2024   HGB 12.8 04/29/2024   HCT 38.2 04/29/2024   PLT 284.0 04/29/2024   GLUCOSE 72 04/29/2024   CHOL 264 (H) 12/29/2023   TRIG 312.0 (H) 12/29/2023   HDL 42.00 12/29/2023   LDLDIRECT 178.0 04/09/2023   LDLCALC 159 (H) 12/29/2023   ALT 8 04/29/2024   AST 12 04/29/2024   NA 138 04/29/2024   K 3.7 04/29/2024   CL 104 04/29/2024   CREATININE 1.08 04/29/2024   BUN 9 04/29/2024   CO2 27 04/29/2024   TSH 2.26 12/29/2023   INR 0.9 08/31/2008   HGBA1C 5.8 04/29/2024   Assessment/Plan:  Lori [name] is a 68 y.o. White or Caucasian [1] female with  has a past medical history of Allergic rhinitis, Anemia, Ankle fracture (05/2016), Anxiety, Arthritis, Asthma, Barrett esophagus (2007), Chest pain, COPD (chronic obstructive pulmonary disease) with chronic bronchitis (HCC), Depression, Duodenitis without mention of hemorrhage (2007), Esophageal reflux (2007), Esophageal stricture, Full dentures, H/O hiatal hernia, Hiatal hernia ([id number],[id number]), HSV infection, Hyperlipidemia, Multiple fractures, Pneumonia (10/2016), Restrictive lung disease (06/30/2017), and Ulcer.  Vitamin D  deficiency Last vitamin D  Lab Results  Component Value  Date   VD25OH 51.02 01/16/2024   Stable, cont oral replacement   Hyperglycemia Lab Results  Component Value Date   HGBA1C 5.8 04/29/2024   Stable, pt to continue current medical treatment  - diet, wt control   B12 deficiency Lab Results  Component Value Date   VITAMINB12 132 (L) 12/29/2023   Low, to start oral replacement - b12 1000 mcg qd   Asthma, chronic obstructive, with acute exacerbation (HCC) Mild to mod, for antibx course  levaquin  500 mg every day, cough med prn, prednisone  taper, depomedrol IM 80 mg, cont inhaler,, for cxr today  to f/u any worsening symptoms or concerns  Followup: Return in about 6 months (around 10/30/2024).  Lynwood Rush, MD 05/01/2024 3:44 PM [facility]

## 2024-04-30 ENCOUNTER — Ambulatory Visit: Payer: Self-pay

## 2024-04-30 NOTE — Telephone Encounter (Signed)
 FYI Only or Action Required?: Action required by provider: clinical question for provider.  Patient was last seen in primary care on 04/29/2024 by Norleen Lynwood ORN, MD.  Called Nurse Triage reporting Diarrhea.  Symptoms began yesterday.  Interventions attempted: Nothing.  Symptoms are: gradually worsening. Her pharmacy had to order her cough medication. Asking for medication for diarrhea. Also asking for chest x-ray results  Triage Disposition: See Physician Within 24 Hours  Patient/caregiver understands and will follow disposition?: Yes   Copied from CRM #[id number]. Topic: Clinical - Red Word Triage >> Apr 30, 2024  8:23 AM Avram MATSU wrote: Red Word that prompted transfer to Nurse Triage: diarrhea last night and today. Patient would also like to know her test result.    ----------------------------------------------------------------------- From previous Reason for Contact - Lab/Test Results: Reason for CRM: Reason for Disposition  [1] MODERATE diarrhea (e.g., 4-6 times / day more than normal) AND [2] present > 48 hours (2 days)  Answer Assessment - Initial Assessment Questions 1. DIARRHEA SEVERITY: How bad is the diarrhea? How many more stools have you had in the past 24 hours than normal?      4 2. ONSET: When did the diarrhea begin?      Last night 3. STOOL DESCRIPTION:  How loose or watery is the diarrhea? What is the stool color? Is there any blood or mucous in the stool?     watery 4. VOMITING: Are you also vomiting? If Yes, ask: How many times in the past 24 hours?      no 5. ABDOMEN PAIN: Are you having any abdomen pain? If Yes, ask: What does it feel like? (e.g., crampy, dull, intermittent, constant)      cramping 6. ABDOMEN PAIN SEVERITY: If present, ask: How bad is the pain?  (e.g., Scale 1-10; mild, moderate, or severe)     moderate 7. ORAL INTAKE: If vomiting, Have you been able to drink liquids? How much liquids have you had in the past 24  hours?     yes 8. HYDRATION: Any signs of dehydration? (e.g., dry mouth [not just dry lips], too weak to stand, dizziness, new weight loss) When did you last urinate?     no 9. EXPOSURE: Have you traveled to a foreign country recently? Have you been exposed to anyone with diarrhea? Could you have eaten any food that was spoiled?     no 10. ANTIBIOTIC USE: Are you taking antibiotics now or have you taken antibiotics in the past 2 months?       no 11. OTHER SYMPTOMS: Do you have any other symptoms? (e.g., fever, blood in stool)       no 12. PREGNANCY: Is there any chance you are pregnant? When was your last menstrual period?       N/a  Protocols used: [facility]

## 2024-04-30 NOTE — Telephone Encounter (Signed)
 Pt should please try OTC immodium or kaopectate as these are often helpful.    thanks

## 2024-04-30 NOTE — Telephone Encounter (Signed)
 Called pt and made her aware of provider advice

## 2024-04-30 NOTE — Telephone Encounter (Signed)
 Duplicate encounter

## 2024-05-01 ENCOUNTER — Encounter: Payer: Self-pay | Admitting: Internal Medicine

## 2024-05-01 NOTE — Assessment & Plan Note (Signed)
 Last vitamin D  Lab Results  Component Value Date   VD25OH 51.02 01/16/2024   Stable, cont oral replacement

## 2024-05-01 NOTE — Assessment & Plan Note (Signed)
 Mild to mod, for antibx course levaquin  500 mg every day, cough med prn, prednisone  taper, depomedrol IM 80 mg, cont inhaler,, for cxr today  to f/u any worsening symptoms or concerns

## 2024-05-01 NOTE — Progress Notes (Deleted)
 Cardiology Office Note:    Date:  05/01/2024   ID:  Lori [name], DOB 1956/08/02, MRN [id number]  PCP:  Norleen Lynwood ORN, MD   [facility] Providers Cardiologist:  Alm Clay, MD { Click to update primary MD,subspecialty MD or APP then REFRESH:1}    Referring MD: Norleen Lynwood ORN, MD   Chief complaint: Atypical chest pain   History of Present Illness:    Lori [name] is a 68 y.o. female with a hx of aortic atherosclerosis, COPD, asthma, anemia, Barrett's esophagus, esophageal strictures, hyperlipidemia, AKI, and tobacco use presents to the office following complaints of atypical chest pain and a request for nitroglycerin .  On 04/04/2024, patient called the office because she ran out of her PRN nitroglycerin  and was requesting a refill, patient had not been seen in the office physically since 2022.  She reported over this phone call that she is using the nitroglycerin , on average, once a month after her children irritate her.  Echo 03/28/2021 showed LVEF 60-65%, normal LV function, no RWMA, G1 DD, normal RV, normal valves.  Stress test on 09/28/2014 was read as normal, TID abnormal at 1.37, normal LV function, no RWMA, LVEF 66%.  Carotid artery duplex bilateral on 02/20/2017 was normal (less than 40% stenosis bilaterally) for carotids, vertebrals, and subclavian arteries.  Coronary calcium  CT on 12/02/2016, showed a coronary calcium  score of 0, normal coronary origin, right dominance, no evidence of CAD.  On 05/22/2021 patient had bilateral ABIs that were normal, no evidence of significant bilateral lower extremity arterial disease, which was unchanged from her study from 07/06/2013.    Past Medical History:  Diagnosis Date   Allergic rhinitis    Anemia    hgb 13.9 on 11/22/21   Ankle fracture 05/2016   Anxiety    Arthritis    Asthma    Barrett esophagus 2007   Chest pain    Hx, no current problems as of 11/22/21.  Recurrent episodes.  Has had 3 negative nuclear stress test, and a  completely normal coronary CT angiogram   COPD (chronic obstructive pulmonary disease) with chronic bronchitis (HCC)    & Emphysema   Depression    Duodenitis without mention of hemorrhage 2007   Esophageal reflux 2007   Esophageal stricture    Full dentures    patient does not wear dentures as of 11/22/21   H/O hiatal hernia    Hiatal hernia 2006,2007   HSV infection    Hyperlipidemia    rx crestor  but pt not taking   Multiple fractures    from falls, fx rt. elbow, fx left wrist, bilateral ankles   Pneumonia 10/2016   Restrictive lung disease 06/30/2017   Ulcer    no current problems as of 11/22/21    Past Surgical History:  Procedure Laterality Date   ABDOMINAL HYSTERECTOMY     BSO   APPENDECTOMY     BIOPSY  04/04/2022   Procedure: BIOPSY;  Surgeon: Wilhelmenia Aloha Raddle., MD;  Location: [facility] ENDOSCOPY;  Service: Gastroenterology;;   CESAREAN SECTION     x 2   CHOLECYSTECTOMY     CHONDROPLASTY Left 01/06/2015   Procedure: CHONDROPLASTY;  Surgeon: Oneil JAYSON Herald, MD;  Location: [facility];  Service: Orthopedics;  Laterality: Left;   COLONOSCOPY     CT CTA CORONARY W/CA SCORE W/CM &/OR WO/CM  12/02/2016   CORONARY CALCIUM  SCORE 0.  NO EVIDENCE OF CAD.  Normal-sized PA.  Normal pulmonary venous drainage the left  atrium.  No ASD or VSD.   ELBOW FRACTURE SURGERY     right   ESOPHAGOGASTRODUODENOSCOPY (EGD) WITH PROPOFOL  N/A 04/04/2022   Procedure: ESOPHAGOGASTRODUODENOSCOPY (EGD) WITH PROPOFOL ;  Surgeon: Wilhelmenia Aloha Raddle., MD;  Location: [facility] ENDOSCOPY;  Service: Gastroenterology;  Laterality: N/A;   EUS N/A 04/04/2022   Procedure: UPPER ENDOSCOPIC ULTRASOUND (EUS) RADIAL;  Surgeon: Wilhelmenia Aloha Raddle., MD;  Location: [facility] ENDOSCOPY;  Service: Gastroenterology;  Laterality: N/A;   EYE SURGERY Bilateral    cataracts removed   INSERTION OF MESH N/A 11/12/2022   Procedure: INSERTION OF MESH;  Surgeon: Rubin Calamity, MD;  Location: [facility] OR;  Service: General;   Laterality: N/A;   KNEE ARTHROSCOPY WITH EXCISION PLICA Left 01/06/2015   Procedure: KNEE ARTHROSCOPY WITH EXCISION PLICA;  Surgeon: Oneil JAYSON Herald, MD;  Location: [facility];  Service: Orthopedics;  Laterality: Left;   LIGAMENT REPAIR Right 11/28/2021   Procedure: RIGHT THUMB LIGAMENT RECONSTRUCTION AND TENDON INTERPOSITION;  Surgeon: Romona Harari, MD;  Location: [facility] OR;  Service: Orthopedics;  Laterality: Right;   Lower Extremity Arterial Dopplers  07/06/2013   RABI 1.0, LABI 1.1.; No evidence of significant vascular atherogenic plaque   NECK SURGERY     NM MYOVIEW  LTD  09/2014   a) LOW RISK. Normal EF of 65%, no RWMA. NO ISCHEMIA OR INFARCTION.;; b) 05/02/2009: Normal study.  LOW RISK.  No ischemia or infarction.  EF 74%.  Breast attenuation noted.   TONSILLECTOMY     TRANSTHORACIC ECHOCARDIOGRAM  03/28/2021   a) Normal EF 60 to 65%.  GR 1 DD.  No R WMA.  Normal RV size and function.  Normal RAP.  Normal valves. => NORMAL;; b) 04/23/16/2010: Normal study.  LOW RISK.  No ischemia or infarction.  EF 74%.  Breast attenuation noted.17/2010: ([facility]) EF>55%.  Normal wall motion.  Normal valves.   WRIST FRACTURE SURGERY     left, has plate   XI ROBOTIC ASSISTED PARAESOPHAGEAL HERNIA REPAIR N/A 11/12/2022   Procedure: ROBOTIC DIAPHRAGMATIC HERNIA REPAIR WITH MESH;  Surgeon: Rubin Calamity, MD;  Location: [facility] OR;  Service: General;  Laterality: N/A;    Current Medications: No outpatient medications have been marked as taking for the 05/03/24 encounter (Appointment) with Emelia Josefa HERO, NP.   Current Facility-Administered Medications for the 05/03/24 encounter (Appointment) with Emelia Josefa HERO, NP  Medication   benralizumab  (FASENRA ) prefilled syringe 30 mg     Allergies:   Advil  [ibuprofen ], Bee venom, Codeine , Clonidine derivatives, Doxycycline , Other, Statins, Latex, Levaquin  [levofloxacin ], and Propoxyphene n-acetaminophen    Social History   Socioeconomic History   Marital  status: Widowed    Spouse name: Ozell   Number of children: 2   Years of education: HS   Highest education level: Not on file  Occupational History   Occupation: disable  Tobacco Use   Smoking status: Every Day    Current packs/day: 0.50    Average packs/day: 0.5 packs/day for 46.7 years (23.4 ttl pk-yrs)    Types: Cigarettes    Start date: 08/06/1977   Smokeless tobacco: Never   Tobacco comments:    Patient has cut back to 2-3 per day as of 11/22/21  Vaping Use   Vaping status: Never Used  Substance and Sexual Activity   Alcohol use: No    Alcohol/week: 0.0 standard drinks of alcohol   Drug use: No   Sexual activity: Not Currently    Birth control/protection: Surgical    Comment: Hysterectomy  Other Topics Concern  Not on file  Social History Narrative   Patient lives at home spouse - Garrel (Deceased).  Has 2 daughters (40 and 62).   Currently disabled.   Caffeine  Use: tea 4 glasses daily.    Smokes ~1/2 PPD  05/15/15   Walks everyday - no set routine.      [facility] (12/03/16):   Originally from [place]. 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 Currently has a dog. No bird exposure. No known mold exposure.    Patient is currently disabled.      Lives alone.   Social Drivers of Corporate investment banker Strain: Low Risk  (11/12/2023)   Overall Financial Resource Strain (CARDIA)    Difficulty of Paying Living Expenses: Not very hard  Food Insecurity: Food Insecurity Present (01/21/2024)   Hunger Vital Sign    Worried About Running Out of Food in the Last Year: Sometimes true    Ran Out of Food in the Last Year: Never true  Transportation Needs: No Transportation Needs (01/21/2024)   PRAPARE - Administrator, Civil Service (Medical): No    Lack of Transportation (Non-Medical): No  Physical Activity: Inactive (11/12/2023)   Exercise Vital Sign    Days of Exercise per Week: 0 days    Minutes of Exercise per Session: 0 min  Stress:  Stress Concern Present (11/12/2023)   [employer] of Occupational Health - Occupational Stress Questionnaire    Feeling of Stress : Rather much  Social Connections: Moderately Isolated (01/16/2024)   Social Connection and Isolation Panel    Frequency of Communication with Friends and Family: More than three times a week    Frequency of Social Gatherings with Friends and Family: Once a week    Attends Religious Services: 1 to 4 times per year    Active Member of [employer] or Organizations: No    Attends Banker Meetings: Never    Marital Status: Widowed     Family History: The patient's ***family history includes Dementia in her mother; Diabetes in her maternal aunt, maternal grandmother, and another family member; Emphysema in her father; Heart disease in her brother. There is no history of Colon cancer, Allergic rhinitis, Angioedema, Asthma, Atopy, Eczema, Immunodeficiency, or Urticaria.  ROS:   Please see the history of present illness.    *** All other systems reviewed and are negative.  EKGs/Labs/Other Studies Reviewed:    The following studies were reviewed today: ***      Recent Labs: 12/29/2023: TSH 2.26 01/18/2024: Magnesium  2.1 04/29/2024: ALT 8; BUN 9; Creatinine, Ser 1.08; Hemoglobin 12.8; Platelets 284.0; Potassium 3.7; Sodium 138  Recent Lipid Panel    Component Value Date/Time   CHOL 264 (H) 12/29/2023 0955   CHOL 316 (H) 03/12/2017 1055   TRIG 312.0 (H) 12/29/2023 0955   HDL 42.00 12/29/2023 0955   HDL 40 03/12/2017 1055   CHOLHDL 6 12/29/2023 0955   VLDL 62.4 (H) 12/29/2023 0955   LDLCALC 159 (H) 12/29/2023 0955   LDLCALC Comment 03/12/2017 1055   LDLDIRECT 178.0 04/09/2023 1151     Risk Assessment/Calculations:   {Does this patient have ATRIAL FIBRILLATION?[phone]}  No BP recorded.  {Refresh Note OR Click here to enter BP  :1}***         Physical Exam:    VS:  There were no vitals taken for this visit.    Wt Readings from Last 3  Encounters:  04/29/24 125 lb (56.7 kg)  02/03/24 117 lb (53.1  kg)  01/23/24 115 lb (52.2 kg)     GEN: *** Well nourished, well developed in no acute distress HEENT: Normal NECK: No JVD; No carotid bruits LYMPHATICS: No lymphadenopathy CARDIAC: ***RRR, no murmurs, rubs, gallops RESPIRATORY:  Clear to auscultation without rales, wheezing or rhonchi  ABDOMEN: Soft, non-tender, non-distended MUSCULOSKELETAL:  No edema; No deformity  SKIN: Warm and dry NEUROLOGIC:  Alert and oriented x 3 PSYCHIATRIC:  Normal affect   ASSESSMENT:    No diagnosis found. PLAN:    In order of problems listed above:  1.  Atypical chest pain   - Symptoms: - Will order repeat Lexiscan  to evaluate for new ischemia given complaints of chest pain while stressed that is relieved by nitroglycerin   2. Mixed hyperlipidemia, chronic   - Lipid panel 12/29/2023: Cholesterol 264, HDL 42, LDL 159, triglycerides 312, VLDL 62   - Will start on ***   - Was referred to advanced lipid clinic in 2022, will repeat referral  3. Aortic atherosclerosis   -    - ***Hyperlipidemia treatment -Nonpharmacological therapies discussed include smoking cessation, healthy diet, exercise, and blood pressure control  4. Current smoker   - Patient is a ***PPD smoker of ***years - Discussed the risks of smoking as well as the benefits of quitting  5. Asthma-COPD overlap syndrome   - Asthma being followed by the allergy and asthma center   - COPD being followed by pulmonology      {Are you ordering a CV Procedure (e.g. stress test, cath, DCCV, TEE, etc)?   Press F2        :[phone]}    Medication Adjustments/Labs and Tests Ordered: Current medicines are reviewed at length with the patient today.  Concerns regarding medicines are outlined above.  No orders of the defined types were placed in this encounter.  No orders of the defined types were placed in this encounter.   There are no Patient Instructions on file for this  visit.   Signed, Miriam FORBES Shams, NP  05/01/2024 8:12 AM    [facility]

## 2024-05-01 NOTE — Assessment & Plan Note (Signed)
 Lab Results  Component Value Date   HGBA1C 5.8 04/29/2024   Stable, pt to continue current medical treatment  - diet, wt control

## 2024-05-01 NOTE — Assessment & Plan Note (Signed)
 Lab Results  Component Value Date   VITAMINB12 132 (L) 12/29/2023   Low, to start oral replacement - b12 1000 mcg qd

## 2024-05-03 ENCOUNTER — Ambulatory Visit: Admitting: General Practice

## 2024-05-03 DIAGNOSIS — E782 Mixed hyperlipidemia: Secondary | ICD-10-CM

## 2024-05-03 DIAGNOSIS — R0789 Other chest pain: Secondary | ICD-10-CM

## 2024-05-03 DIAGNOSIS — F17200 Nicotine dependence, unspecified, uncomplicated: Secondary | ICD-10-CM

## 2024-05-03 DIAGNOSIS — I700 Atherosclerosis of aorta: Secondary | ICD-10-CM

## 2024-05-03 DIAGNOSIS — J4489 Other specified chronic obstructive pulmonary disease: Secondary | ICD-10-CM

## 2024-05-04 ENCOUNTER — Encounter: Payer: Self-pay | Admitting: Internal Medicine

## 2024-05-05 ENCOUNTER — Telehealth: Payer: Self-pay

## 2024-05-05 NOTE — Telephone Encounter (Signed)
 Copied from CRM [id number]. Topic: Clinical - Lab/Test Results >> May 05, 2024  3:48 PM Turkey A wrote: Reason for CRM: Patient would like results from X-ray performed. Contact please

## 2024-05-06 ENCOUNTER — Other Ambulatory Visit: Payer: Self-pay | Admitting: Internal Medicine

## 2024-05-06 ENCOUNTER — Telehealth: Payer: Self-pay

## 2024-05-06 ENCOUNTER — Ambulatory Visit (INDEPENDENT_AMBULATORY_CARE_PROVIDER_SITE_OTHER)

## 2024-05-06 DIAGNOSIS — J309 Allergic rhinitis, unspecified: Secondary | ICD-10-CM

## 2024-05-06 NOTE — Telephone Encounter (Unsigned)
 Copied from CRM #[id number]. Topic: Clinical - Lab/Test Results >> May 06, 2024 11:12 AM Ismael A wrote: Reason for CRM: patient calling CMA Ronnald back regarding lab results - please follow up at earliest convinience.

## 2024-05-06 NOTE — Telephone Encounter (Unsigned)
 Copied from CRM [id number]. Topic: Clinical - Medication Refill >> May 06, 2024  4:38 PM Deleta S wrote: Medication: HYDROcodone  bit-homatropine (HYCODAN) 5-1.5 MG/5ML syrup and amoxicillin -clavulanate (AUGMENTIN ) 875-125 MG tablet  Has the patient contacted their pharmacy? No (Agent: If no, request that the patient contact the pharmacy for the refill. If patient does not wish to contact the pharmacy document the reason why and proceed with request.) (Agent: If yes, when and what did the pharmacy advise?)  [facility] [address], [place] - [address] [place] [address] Phone: [phone] Fax: [phone]  Is this the correct pharmacy for this prescription? Yes If no, delete pharmacy and type the correct one.   Has the prescription been filled recently? Yes  Is the patient out of the medication? Yes  Has the patient been seen for an appointment in the last year OR does the patient have an upcoming appointment? Yes  Can we respond through MyChart? No  Agent: Please be advised that Rx refills may take up to 3 business days. We ask that you follow-up with your pharmacy.

## 2024-05-06 NOTE — Progress Notes (Signed)
 Letter printed and mailed.

## 2024-05-06 NOTE — Telephone Encounter (Signed)
 Copied from CRM #[id number]. Topic: Clinical - Lab/Test Results >> May 06, 2024  2:46 PM Aisha D wrote: Reason for CRM: patient calling CMA Ronnald back regarding lab results - please follow up at earliest convinience.   UPDATE: Pt is calling back to speak with Ronnald in regards to lab results. I informed the pt that Ronnald is currently with another pt and will callback once available.

## 2024-05-06 NOTE — Telephone Encounter (Signed)
 Called pt and left voice mail to call back for lab results and also results being mailed.

## 2024-05-07 NOTE — Telephone Encounter (Signed)
 Called pt and made her aware of her imaging results

## 2024-05-07 NOTE — Telephone Encounter (Signed)
 Called pt and made her aware imaging results.

## 2024-05-24 ENCOUNTER — Encounter: Payer: Self-pay | Admitting: Physician Assistant

## 2024-05-24 ENCOUNTER — Ambulatory Visit: Admitting: Physician Assistant

## 2024-05-24 ENCOUNTER — Ambulatory Visit: Admitting: Cardiology

## 2024-05-24 ENCOUNTER — Other Ambulatory Visit: Payer: Self-pay | Admitting: Internal Medicine

## 2024-05-31 ENCOUNTER — Ambulatory Visit (INDEPENDENT_AMBULATORY_CARE_PROVIDER_SITE_OTHER): Admitting: Orthopedic Surgery

## 2024-05-31 ENCOUNTER — Other Ambulatory Visit (INDEPENDENT_AMBULATORY_CARE_PROVIDER_SITE_OTHER)

## 2024-05-31 ENCOUNTER — Ambulatory Visit (INDEPENDENT_AMBULATORY_CARE_PROVIDER_SITE_OTHER)

## 2024-05-31 ENCOUNTER — Encounter: Payer: Self-pay | Admitting: Orthopedic Surgery

## 2024-05-31 DIAGNOSIS — G8929 Other chronic pain: Secondary | ICD-10-CM

## 2024-05-31 DIAGNOSIS — M25571 Pain in right ankle and joints of right foot: Secondary | ICD-10-CM

## 2024-05-31 DIAGNOSIS — M25572 Pain in left ankle and joints of left foot: Secondary | ICD-10-CM

## 2024-05-31 DIAGNOSIS — M1009 Idiopathic gout, multiple sites: Secondary | ICD-10-CM | POA: Diagnosis not present

## 2024-05-31 DIAGNOSIS — M6701 Short Achilles tendon (acquired), right ankle: Secondary | ICD-10-CM | POA: Diagnosis not present

## 2024-05-31 DIAGNOSIS — M1A0110 Idiopathic chronic gout, right shoulder, without tophus (tophi): Secondary | ICD-10-CM

## 2024-05-31 DIAGNOSIS — M6702 Short Achilles tendon (acquired), left ankle: Secondary | ICD-10-CM | POA: Diagnosis not present

## 2024-05-31 DIAGNOSIS — J309 Allergic rhinitis, unspecified: Secondary | ICD-10-CM

## 2024-05-31 MED ORDER — COLCHICINE 0.6 MG PO TABS: 0.6000 mg | ORAL_TABLET | Freq: Every day | ORAL | 3 refills | Status: AC
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
## 2024-06-01 ENCOUNTER — Ambulatory Visit: Payer: Self-pay

## 2024-06-01 LAB — URIC ACID: Uric Acid, Serum: 4.3 mg/dL (ref 2.5–7.0)

## 2024-06-01 NOTE — Telephone Encounter (Signed)
-----   Message from Jerona LULLA Sage sent at 06/01/2024 11:43 AM EDT ----- Call patient.  Uric acid 4.3.  This is in the normal range.  Follow-up in 2 weeks to see if there is a change in symptoms with using colchicine . ----- Message ----- From: Interface, [facility] Lab Results In Sent: 06/01/2024  12:28 AM EDT To: Jerona Sage LULLA, MD

## 2024-06-01 NOTE — Telephone Encounter (Signed)
 Called and lm on vm to advise of lab results to call with any questions.

## 2024-06-02 ENCOUNTER — Telehealth: Payer: Self-pay | Admitting: Radiology

## 2024-06-02 NOTE — Telephone Encounter (Signed)
 Copied from CRM [id number]. Topic: Clinical - Medication Question >> Jun 02, 2024 10:24 AM Adelita E wrote: Reason for CRM: Bettyann from [facility] called in wanting confirmation if patient is still actively taking her FLUoxetine  (PROZAC ) 20 MG capsule and her Celexa . Callback number for Bettyann is [phone].

## 2024-06-03 ENCOUNTER — Other Ambulatory Visit: Payer: Self-pay | Admitting: Internal Medicine

## 2024-06-03 ENCOUNTER — Other Ambulatory Visit: Payer: Self-pay | Admitting: Family

## 2024-06-03 DIAGNOSIS — M542 Cervicalgia: Secondary | ICD-10-CM | POA: Diagnosis not present

## 2024-06-03 DIAGNOSIS — G43719 Chronic migraine without aura, intractable, without status migrainosus: Secondary | ICD-10-CM | POA: Diagnosis not present

## 2024-06-03 DIAGNOSIS — J4550 Severe persistent asthma, uncomplicated: Secondary | ICD-10-CM

## 2024-06-04 ENCOUNTER — Telehealth: Payer: Self-pay

## 2024-06-04 ENCOUNTER — Other Ambulatory Visit: Payer: Self-pay | Admitting: *Deleted

## 2024-06-04 DIAGNOSIS — J4550 Severe persistent asthma, uncomplicated: Secondary | ICD-10-CM

## 2024-06-04 MED ORDER — LEVALBUTEROL TARTRATE 45 MCG/ACT IN AERO: 2.0000 | INHALATION_SPRAY | RESPIRATORY_TRACT | 3 refills | Status: AC | PRN

## 2024-06-04 NOTE — Telephone Encounter (Signed)
 Copied from CRM [id number]. Topic: Clinical - Medication Question >> Jun 04, 2024  1:43 PM Maisie C wrote: Reason for CRM: Therisa from [facility] pharmacy called for clarification on 2 of the patient's medications that are Fluoxetine  & Citalopram . She stated that per the pharmacist, patient's are not usually on both and they want to confirm which one is active for the patient. Please call and advise at [phone].

## 2024-06-07 ENCOUNTER — Encounter: Payer: Self-pay | Admitting: Orthopedic Surgery

## 2024-06-07 ENCOUNTER — Telehealth: Payer: Self-pay

## 2024-06-07 ENCOUNTER — Ambulatory Visit (INDEPENDENT_AMBULATORY_CARE_PROVIDER_SITE_OTHER): Admitting: Orthopedic Surgery

## 2024-06-07 ENCOUNTER — Other Ambulatory Visit: Payer: Self-pay | Admitting: Orthopedic Surgery

## 2024-06-07 DIAGNOSIS — G8929 Other chronic pain: Secondary | ICD-10-CM | POA: Diagnosis not present

## 2024-06-07 DIAGNOSIS — M25572 Pain in left ankle and joints of left foot: Secondary | ICD-10-CM | POA: Diagnosis not present

## 2024-06-07 DIAGNOSIS — M25571 Pain in right ankle and joints of right foot: Secondary | ICD-10-CM | POA: Diagnosis not present

## 2024-06-07 MED ORDER — TRAMADOL HCL 50 MG PO TABS: 50.0000 mg | ORAL_TABLET | Freq: Four times a day (QID) | ORAL | 0 refills | Status: DC | PRN

## 2024-06-07 MED ORDER — HYDROCODONE-ACETAMINOPHEN 5-325 MG PO TABS: 1.0000 | ORAL_TABLET | Freq: Four times a day (QID) | ORAL | 0 refills | Status: DC | PRN

## 2024-06-07 NOTE — Progress Notes (Signed)
 Office Visit Note   Patient: Lori [name]           Date of Birth: [birth year]           MRN: [id number] Visit Date: 06/07/2024              Requested by: Norleen Lynwood ORN, MD [address] [place],  [place] [address] PCP: Norleen Lynwood ORN, MD  Chief Complaint  Patient presents with   Right Ankle - Follow-up   Left Ankle - Follow-up      HPI: Discussed the use of AI scribe software for clinical note transcription with the patient, who gave verbal consent to proceed.  History of Present Illness Lori [name] is a 68 year old female who presents with severe bilateral foot and ankle pain.  She experiences severe stabbing pain in both feet and ankles, with the right foot being more painful than the left. The pain is constant and does not improve with elevation or dependency, significantly impacting her mobility as she requires a cane to walk. There is swelling around the right ankle.  She has a history of using colchicine , which was discontinued due to nausea. Prednisone  was also tried but was not tolerated due to sickness. Over-the-counter medications such as Aleve , Advil , Motrin , and Tylenol  have been ineffective. Ultram  (tramadol ) was previously used but caused nausea and did not alleviate her pain.  She experiences cramping in her legs and hands, which she attributes to low electrolytes.  She reports a new issue with her eye, describing it as a 'sty' or 'cyst' that appeared that morning.     Assessment & Plan: Visit Diagnoses: No diagnosis found.  Plan: Assessment and Plan Assessment & Plan Chronic bilateral foot and ankle pain with swelling and toe clawing Severe, constant pain not relieved by elevation or dependency. Tenderness around foot and ankle bilaterally, swelling over peroneal tendons. Strong posterior tibial and dorsalis pedis pulses bilaterally. Negative straight leg raise. No signs of cellulitis or ischemic ulcers. Intolerance to colchicine , prednisone , OTC  anti-inflammatories, and acetaminophen . Tramadol  considered despite previous nausea. - Prescribed tramadol  for pain management. - Follow up in two weeks.  Cramping in legs and hands, possible electrolyte imbalance Cramping in legs and hands possibly due to electrolyte imbalance. - Advised intake of sugar-free Gatorade, coconut water , or electrolyte pouches to manage cramping.      Follow-Up Instructions: No follow-ups on file.   Ortho Exam  Patient is alert, oriented, no adenopathy, well-dressed, normal affect, normal respiratory effort. Physical Exam EXTREMITIES: Exquisite tenderness to light palpation around the foot and ankle bilaterally. Swelling over the peroneal tendons bilaterally. Pain with Doppler examination. Strong posterior tibial and dorsalis pedis pulses bilaterally, biphasic. Negative straight leg raise. SKIN: No skin color or temperature changes. No redness of cellulitis, no ischemic ulcers.      Imaging: No results found. No images are attached to the encounter.  Labs: Lab Results  Component Value Date   HGBA1C 5.8 04/29/2024   HGBA1C 5.4 12/29/2023   HGBA1C 5.3 04/09/2023   ESRSEDRATE 18 06/30/2017   ESRSEDRATE 17 10/06/2012   ESRSEDRATE 21 12/08/2007   CRP 0.5 06/30/2017   CRP 1.5 10/06/2012   LABURIC 4.3 05/31/2024   REPTSTATUS 01/20/2024 FINAL 01/15/2024   CULT  01/15/2024    NO GROWTH 5 DAYS Performed at [facility], [address], [place], [place] [address]    LABORGA NO GROWTH 06/21/2016     Lab Results  Component Value Date  ALBUMIN 4.3 04/29/2024   ALBUMIN 3.8 01/23/2024   ALBUMIN 4.0 01/23/2024    Lab Results  Component Value Date   MG 2.1 01/18/2024   MG 1.7 06/30/2023   MG 2.1 05/16/2023   Lab Results  Component Value Date   VD25OH 51.02 01/16/2024   VD25OH 41.11 12/29/2023   VD25OH 13.50 (L) 04/09/2023    No results found for: PREALBUMIN    Latest Ref Rng & Units 04/29/2024   10:53 AM 01/23/2024    7:35 PM  01/23/2024   10:09 AM  CBC EXTENDED  WBC 4.0 - 10.5 K/uL 7.4  18.6  19.8 Repeated and verified X2.   RBC 3.87 - 5.11 Mil/uL 4.45  4.38  4.54   Hemoglobin 12.0 - 15.0 g/dL 87.1  86.2  86.1   HCT 36.0 - 46.0 % 38.2  40.0  41.8   Platelets 150.0 - 400.0 K/uL 284.0  360  384.0   NEUT# 1.4 - 7.7 K/uL 4.2  9.7  12.7   Lymph# 0.7 - 4.0 K/uL 2.7  7.4  5.5      There is no height or weight on file to calculate BMI.  Orders:  No orders of the defined types were placed in this encounter.  Meds ordered this encounter  Medications   traMADol  (ULTRAM ) 50 MG tablet    Sig: Take 1 tablet (50 mg total) by mouth every 6 (six) hours as needed.    Dispense:  30 tablet    Refill:  0     Procedures: No procedures performed  Clinical Data: No additional findings.  ROS:  All other systems negative, except as noted in the HPI. Review of Systems  Objective: Vital Signs: There were no vitals taken for this visit.  Specialty Comments:  No specialty comments available.  PMFS History: Patient Active Problem List   Diagnosis Date Noted   Diarrhea 02/03/2024   Leucocytosis 01/23/2024   Asthma, chronic obstructive, with acute exacerbation (HCC) 01/15/2024   Lower back pain 12/29/2023   Osteoporosis 12/29/2023   Chest pain 06/08/2023   Hypomagnesemia 05/15/2023   Upper abdominal pain 09/19/2022   Constipation 09/19/2022   Lower abdominal pain 06/25/2022   COVID-19 virus infection 06/09/2022   Pain in left ankle and joints of left foot 05/15/2022   Weight loss 01/10/2022   Arthritis of carpometacarpal [facility]) joint of right thumb 10/25/2021   Right-sided chest pain 09/05/2021   Neck pain 04/11/2021   Thoracic spine pain 04/11/2021   Low back pain 04/11/2021   Bilateral hip pain 04/11/2021   AKI (acute kidney injury) (HCC) 04/11/2021   Allergic rhinitis 01/08/2021   Abnormal urine odor 11/14/2020   Aortic atherosclerosis (HCC) 11/14/2020   Severe persistent asthma without complication  11/02/2020   Hymenoptera allergy 11/02/2020   Genital herpes 11/12/2019   Coccyx pain 11/12/2019   Insomnia 11/12/2019   Acute right-sided low back pain without sciatica 07/20/2019   Grief 07/20/2019   Vitamin D  deficiency 07/20/2019   Hyperglycemia 04/21/2019   Left knee pain 04/21/2019   Pain 12/29/2018   B12 deficiency 10/20/2018   Bilateral hand pain 08/03/2018   Nausea 04/27/2018   Anemia, iron  deficiency 04/27/2018   Intermittent paresthesia of hand and foot 04/27/2018   Headache 03/21/2018   Lumbar radiculitis 08/15/2017   Degenerative disc disease, lumbar 07/21/2017   Encounter for well adult exam with abnormal findings 06/30/2017   Epigastric pain 06/30/2017   Restrictive lung disease 06/30/2017   Leg cramping 06/09/2017   Elbow  contusion 06/09/2017   Right elbow pain 05/27/2017   Asthma-COPD overlap syndrome (HCC) 01/31/2017   Seasonal and perennial allergic rhinitis 01/01/2017   Moderate persistent asthma, uncomplicated 01/01/2017   Pulmonary emphysema (HCC) 12/03/2016   Tobacco abuse 12/03/2016   Non-seasonal allergic rhinitis due to fungal spores 12/03/2016   Medication management 11/29/2016   Rash 11/23/2016   Gastroesophageal reflux disease    Community acquired pneumonia of left lower lobe of lung 10/26/2016   Vertigo 10/22/2016   Gastroesophageal reflux disease with esophagitis 10/22/2016   Non compliance w medication regimen 08/01/2016   Allergic contact dermatitis due to metals 08/01/2016   Migraine without aura and without status migrainosus, not intractable 07/22/2016   Spasm of muscle of lower back 06/21/2016   Chronic rhinitis 06/19/2016   Compliance poor 06/19/2016   Gait difficulty 06/21/2015   Fibromyalgia 06/21/2015   Conversion reaction 06/21/2015   Anxiety and depression 06/21/2015   Syncope and collapse 06/21/2015   Plica syndrome of left knee 01/06/2015   Anxiety state 09/06/2014   History of MI 09/06/2014   Cigarette smoker 06/27/2013    Borderline hypertension 06/27/2013   Chronic low back pain with sciatica 06/14/2013   Leg pain, bilateral 06/14/2013   Gastroparesis 10/26/2009   Musculoskeletal chest pain 05/02/2009   Hyperlipidemia LDL goal <100 11/18/2008   COPD exacerbation (HCC) 11/18/2008   HEPATIC CYST 11/18/2008   Past Medical History:  Diagnosis Date   Allergic rhinitis    Anemia    hgb 13.9 on 11/22/21   Ankle fracture 05/2016   Anxiety    Arthritis    Asthma    Barrett esophagus 2007   Chest pain    Hx, no current problems as of 11/22/21.  Recurrent episodes.  Has had 3 negative nuclear stress test, and a completely normal coronary CT angiogram   COPD (chronic obstructive pulmonary disease) with chronic bronchitis (HCC)    & Emphysema   Depression    Duodenitis without mention of hemorrhage 2007   Esophageal reflux 2007   Esophageal stricture    Full dentures    patient does not wear dentures as of 11/22/21   H/O hiatal hernia    Hiatal hernia 2006,2007   HSV infection    Hyperlipidemia    rx crestor  but pt not taking   Multiple fractures    from falls, fx rt. elbow, fx left wrist, bilateral ankles   Pneumonia 10/2016   Restrictive lung disease 06/30/2017   Ulcer    no current problems as of 11/22/21    Family History  Problem Relation Age of Onset   Emphysema Father    Dementia Mother    Diabetes Maternal Grandmother    Diabetes Maternal Aunt    Diabetes Other        mat. cousin   Heart disease Brother    Colon cancer Neg Hx    Allergic rhinitis Neg Hx    Angioedema Neg Hx    Asthma Neg Hx    Atopy Neg Hx    Eczema Neg Hx    Immunodeficiency Neg Hx    Urticaria Neg Hx     Past Surgical History:  Procedure Laterality Date   ABDOMINAL HYSTERECTOMY     BSO   APPENDECTOMY     BIOPSY  04/04/2022   Procedure: BIOPSY;  Surgeon: Wilhelmenia, Aloha Raddle., MD;  Location: [facility];  Service: Gastroenterology;;   CESAREAN SECTION     x 2   CHOLECYSTECTOMY     CHONDROPLASTY  Left  01/06/2015   Procedure: CHONDROPLASTY;  Surgeon: Oneil JAYSON Herald, MD;  Location: [facility];  Service: Orthopedics;  Laterality: Left;   COLONOSCOPY     CT CTA CORONARY W/CA SCORE W/CM &/OR WO/CM  12/02/2016   CORONARY CALCIUM  SCORE 0.  NO EVIDENCE OF CAD.  Normal-sized PA.  Normal pulmonary venous drainage the left atrium.  No ASD or VSD.   ELBOW FRACTURE SURGERY     right   ESOPHAGOGASTRODUODENOSCOPY (EGD) WITH PROPOFOL  N/A 04/04/2022   Procedure: ESOPHAGOGASTRODUODENOSCOPY (EGD) WITH PROPOFOL ;  Surgeon: Wilhelmenia Aloha Raddle., MD;  Location: [facility];  Service: Gastroenterology;  Laterality: N/A;   EUS N/A 04/04/2022   Procedure: UPPER ENDOSCOPIC ULTRASOUND (EUS) RADIAL;  Surgeon: Wilhelmenia Aloha Raddle., MD;  Location: [facility] ENDOSCOPY;  Service: Gastroenterology;  Laterality: N/A;   EYE SURGERY Bilateral    cataracts removed   INSERTION OF MESH N/A 11/12/2022   Procedure: INSERTION OF MESH;  Surgeon: Rubin Calamity, MD;  Location: [facility];  Service: General;  Laterality: N/A;   KNEE ARTHROSCOPY WITH EXCISION PLICA Left 01/06/2015   Procedure: KNEE ARTHROSCOPY WITH EXCISION PLICA;  Surgeon: Oneil JAYSON Herald, MD;  Location: [facility];  Service: Orthopedics;  Laterality: Left;   LIGAMENT REPAIR Right 11/28/2021   Procedure: RIGHT THUMB LIGAMENT RECONSTRUCTION AND TENDON INTERPOSITION;  Surgeon: Romona Harari, MD;  Location: [facility] OR;  Service: Orthopedics;  Laterality: Right;   Lower Extremity Arterial Dopplers  07/06/2013   RABI 1.0, LABI 1.1.; No evidence of significant vascular atherogenic plaque   NECK SURGERY     NM MYOVIEW  LTD  09/2014   a) LOW RISK. Normal EF of 65%, no RWMA. NO ISCHEMIA OR INFARCTION.;; b) 05/02/2009: Normal study.  LOW RISK.  No ischemia or infarction.  EF 74%.  Breast attenuation noted.   TONSILLECTOMY     TRANSTHORACIC ECHOCARDIOGRAM  03/28/2021   a) Normal EF 60 to 65%.  GR 1 DD.  No R WMA.  Normal RV size and function.  Normal RAP.   Normal valves. => NORMAL;; b) 04/23/16/2010: Normal study.  LOW RISK.  No ischemia or infarction.  EF 74%.  Breast attenuation noted.17/2010: ([facility]) EF>55%.  Normal wall motion.  Normal valves.   WRIST FRACTURE SURGERY     left, has plate   XI ROBOTIC ASSISTED PARAESOPHAGEAL HERNIA REPAIR N/A 11/12/2022   Procedure: ROBOTIC DIAPHRAGMATIC HERNIA REPAIR WITH MESH;  Surgeon: Rubin Calamity, MD;  Location: [facility] OR;  Service: General;  Laterality: N/A;   Social History   Occupational History   Occupation: disable  Tobacco Use   Smoking status: Every Day    Current packs/day: 0.50    Average packs/day: 0.5 packs/day for 46.8 years (23.4 ttl pk-yrs)    Types: Cigarettes    Start date: 08/06/1977   Smokeless tobacco: Never   Tobacco comments:    Patient has cut back to 2-3 per day as of 11/22/21  Vaping Use   Vaping status: Never Used  Substance and Sexual Activity   Alcohol use: No    Alcohol/week: 0.0 standard drinks of alcohol   Drug use: No   Sexual activity: Not Currently    Birth control/protection: Surgical    Comment: Hysterectomy

## 2024-06-07 NOTE — Telephone Encounter (Signed)
 The patient called in, saying she went to her pharmacy after her visit here today ([facility] [address]) and they told her she cannot get her Tramadol  filled until 06/21/24 - too early. That is when she is supposed to return to see Dr. Harden for a recheck. The patient has Gabapentin  that she takes for her migraines. She took one of those today, but states it does not help this pain. The patient asks for something different or stronger. Please advise. [phone].

## 2024-06-08 ENCOUNTER — Ambulatory Visit

## 2024-06-08 DIAGNOSIS — L723 Sebaceous cyst: Secondary | ICD-10-CM | POA: Diagnosis not present

## 2024-06-08 DIAGNOSIS — Z961 Presence of intraocular lens: Secondary | ICD-10-CM | POA: Diagnosis not present

## 2024-06-08 DIAGNOSIS — J4550 Severe persistent asthma, uncomplicated: Secondary | ICD-10-CM | POA: Diagnosis not present

## 2024-06-09 NOTE — Telephone Encounter (Signed)
 Called and spoke with Allisa at [facility] and informed her that Dr.John had dropped the citalopram  on the last prescription request we had received for these two medications. Confirmed that all meds are now squared away through their pharmacy

## 2024-06-21 ENCOUNTER — Ambulatory Visit (INDEPENDENT_AMBULATORY_CARE_PROVIDER_SITE_OTHER): Admitting: Orthopedic Surgery

## 2024-06-21 ENCOUNTER — Encounter: Payer: Self-pay | Admitting: Orthopedic Surgery

## 2024-06-21 ENCOUNTER — Ambulatory Visit

## 2024-06-21 DIAGNOSIS — G8929 Other chronic pain: Secondary | ICD-10-CM | POA: Diagnosis not present

## 2024-06-21 DIAGNOSIS — J309 Allergic rhinitis, unspecified: Secondary | ICD-10-CM

## 2024-06-21 DIAGNOSIS — M25571 Pain in right ankle and joints of right foot: Secondary | ICD-10-CM

## 2024-06-21 DIAGNOSIS — S8291XA Unspecified fracture of right lower leg, initial encounter for closed fracture: Secondary | ICD-10-CM | POA: Diagnosis not present

## 2024-06-21 DIAGNOSIS — M25572 Pain in left ankle and joints of left foot: Secondary | ICD-10-CM

## 2024-06-21 NOTE — Progress Notes (Signed)
 Office Visit Note   Patient: Lori [name]           Date of Birth: February 22, 1956           MRN: [id number] Visit Date: 06/21/2024              Requested by: Norleen Lynwood ORN, MD [address] [place],  [place] [address] PCP: Norleen Lynwood ORN, MD  Chief Complaint  Patient presents with   Right Ankle - Follow-up   Left Ankle - Follow-up      HPI: Discussed the use of AI scribe software for clinical note transcription with the patient, who gave verbal consent to proceed.  History of Present Illness Lori [name] is a 68 year old female who presents with right foot and ankle pain. She is accompanied by her friend, Dagoberto.  She experiences persistent and severe pain in her right foot and ankle, significantly affecting her ability to walk and necessitating the use of a cane. The pain is localized to her entire foot and ankle, with particular discomfort around the knee and ankle area. Her left side is asymptomatic.  She previously attempted to manage the pain with high-dose codeine , which caused significant nausea and was discontinued. She is awaiting the availability of tramadol  for pain relief.  She has a history of falls, resulting in multiple hospital visits. She recalls a past injury to her ankle that 'almost broke years ago,' and notes that the pain has recently started to recur.  In terms of dietary habits, she avoids certain foods that may increase uric acid levels, such as red meats and alcohol, as advised by her daughter. She is concerned about the impact of diet on her condition.     Assessment & Plan: Visit Diagnoses:  1. Chronic pain of both ankles     Plan: Assessment and Plan Assessment & Plan Right foot and ankle pain Chronic pain with global tenderness, impairing ambulation. - Provide fracture boot to unload pressure. - Re-evaluate in four weeks. - Instructed to pick up tramadol  for pain management tonight.      Follow-Up Instructions: No follow-ups on  file.   Ortho Exam  Patient is alert, oriented, no adenopathy, well-dressed, normal affect, normal respiratory effort. Physical Exam EXTREMITIES: Antalgic gait with right-sided limp. Left foot and ankle asymptomatic with good range of motion. Right foot resists but has active range of motion. Palpable pulse in right foot, no ischemic changes. No effusion in right ankle. No redness or swelling in right foot. Global tenderness to light touch in right foot and ankle.  No dystrophic changes no change in temperature or color.      Imaging: No results found. No images are attached to the encounter.  Labs: Lab Results  Component Value Date   HGBA1C 5.8 04/29/2024   HGBA1C 5.4 12/29/2023   HGBA1C 5.3 04/09/2023   ESRSEDRATE 18 06/30/2017   ESRSEDRATE 17 10/06/2012   ESRSEDRATE 21 12/08/2007   CRP 0.5 06/30/2017   CRP 1.5 10/06/2012   LABURIC 4.3 05/31/2024   REPTSTATUS 01/20/2024 FINAL 01/15/2024   CULT  01/15/2024    NO GROWTH 5 DAYS Performed at [facility], [address], [place], [place] [address]    LABORGA NO GROWTH 06/21/2016     Lab Results  Component Value Date   ALBUMIN 4.3 04/29/2024   ALBUMIN 3.8 01/23/2024   ALBUMIN 4.0 01/23/2024    Lab Results  Component Value Date   MG 2.1 01/18/2024  MG 1.7 06/30/2023   MG 2.1 05/16/2023   Lab Results  Component Value Date   VD25OH 51.02 01/16/2024   VD25OH 41.11 12/29/2023   VD25OH 13.50 (L) 04/09/2023    No results found for: PREALBUMIN    Latest Ref Rng & Units 04/29/2024   10:53 AM 01/23/2024    7:35 PM 01/23/2024   10:09 AM  CBC EXTENDED  WBC 4.0 - 10.5 K/uL 7.4  18.6  19.8 Repeated and verified X2.   RBC 3.87 - 5.11 Mil/uL 4.45  4.38  4.54   Hemoglobin 12.0 - 15.0 g/dL 87.1  86.2  86.1   HCT 36.0 - 46.0 % 38.2  40.0  41.8   Platelets 150.0 - 400.0 K/uL 284.0  360  384.0   NEUT# 1.4 - 7.7 K/uL 4.2  9.7  12.7   Lymph# 0.7 - 4.0 K/uL 2.7  7.4  5.5      There is no height or weight on file to  calculate BMI.  Orders:  No orders of the defined types were placed in this encounter.  No orders of the defined types were placed in this encounter.    Procedures: No procedures performed  Clinical Data: No additional findings.  ROS:  All other systems negative, except as noted in the HPI. Review of Systems  Objective: Vital Signs: There were no vitals taken for this visit.  Specialty Comments:  No specialty comments available.  PMFS History: Patient Active Problem List   Diagnosis Date Noted   Diarrhea 02/03/2024   Leucocytosis 01/23/2024   Asthma, chronic obstructive, with acute exacerbation (HCC) 01/15/2024   Lower back pain 12/29/2023   Osteoporosis 12/29/2023   Chest pain 06/08/2023   Hypomagnesemia 05/15/2023   Upper abdominal pain 09/19/2022   Constipation 09/19/2022   Lower abdominal pain 06/25/2022   COVID-19 virus infection 06/09/2022   Pain in left ankle and joints of left foot 05/15/2022   Weight loss 01/10/2022   Arthritis of carpometacarpal (CMC) joint of right thumb 10/25/2021   Right-sided chest pain 09/05/2021   Neck pain 04/11/2021   Thoracic spine pain 04/11/2021   Low back pain 04/11/2021   Bilateral hip pain 04/11/2021   AKI (acute kidney injury) 04/11/2021   Allergic rhinitis 01/08/2021   Abnormal urine odor 11/14/2020   Aortic atherosclerosis 11/14/2020   Severe persistent asthma without complication (HCC) 11/02/2020   Hymenoptera allergy 11/02/2020   Genital herpes 11/12/2019   Coccyx pain 11/12/2019   Insomnia 11/12/2019   Acute right-sided low back pain without sciatica 07/20/2019   Grief 07/20/2019   Vitamin D  deficiency 07/20/2019   Hyperglycemia 04/21/2019   Left knee pain 04/21/2019   Pain 12/29/2018   B12 deficiency 10/20/2018   Bilateral hand pain 08/03/2018   Nausea 04/27/2018   Anemia, iron  deficiency 04/27/2018   Intermittent paresthesia of hand and foot 04/27/2018   Headache 03/21/2018   Lumbar radiculitis  08/15/2017   Degenerative disc disease, lumbar 07/21/2017   Encounter for well adult exam with abnormal findings 06/30/2017   Epigastric pain 06/30/2017   Restrictive lung disease 06/30/2017   Leg cramping 06/09/2017   Elbow contusion 06/09/2017   Right elbow pain 05/27/2017   Asthma-COPD overlap syndrome (HCC) 01/31/2017   Seasonal and perennial allergic rhinitis 01/01/2017   Moderate persistent asthma, uncomplicated 01/01/2017   Pulmonary emphysema (HCC) 12/03/2016   Tobacco abuse 12/03/2016   Non-seasonal allergic rhinitis due to fungal spores 12/03/2016   Medication management 11/29/2016   Rash 11/23/2016   Gastroesophageal reflux  disease    Community acquired pneumonia of left lower lobe of lung 10/26/2016   Vertigo 10/22/2016   Gastroesophageal reflux disease with esophagitis 10/22/2016   Non compliance w medication regimen 08/01/2016   Allergic contact dermatitis due to metals 08/01/2016   Migraine without aura and without status migrainosus, not intractable 07/22/2016   Spasm of muscle of lower back 06/21/2016   Chronic rhinitis 06/19/2016   Compliance poor 06/19/2016   Gait difficulty 06/21/2015   Fibromyalgia 06/21/2015   Conversion reaction 06/21/2015   Anxiety and depression 06/21/2015   Syncope and collapse 06/21/2015   Plica syndrome of left knee 01/06/2015   Anxiety state 09/06/2014   History of MI 09/06/2014   Cigarette smoker 06/27/2013   Borderline hypertension 06/27/2013   Chronic low back pain with sciatica 06/14/2013   Leg pain, bilateral 06/14/2013   Gastroparesis 10/26/2009   Musculoskeletal chest pain 05/02/2009   Hyperlipidemia LDL goal <100 11/18/2008   COPD exacerbation (HCC) 11/18/2008   HEPATIC CYST 11/18/2008   Past Medical History:  Diagnosis Date   Allergic rhinitis    Anemia    hgb 13.9 on 11/22/21   Ankle fracture 05/2016   Anxiety    Arthritis    Asthma    Barrett esophagus 2007   Chest pain    Hx, no current problems as of  11/22/21.  Recurrent episodes.  Has had 3 negative nuclear stress test, and a completely normal coronary CT angiogram   COPD (chronic obstructive pulmonary disease) with chronic bronchitis (HCC)    & Emphysema   Depression    Duodenitis without mention of hemorrhage 2007   Esophageal reflux 2007   Esophageal stricture    Full dentures    patient does not wear dentures as of 11/22/21   H/O hiatal hernia    Hiatal hernia 2006,2007   HSV infection    Hyperlipidemia    rx crestor  but pt not taking   Multiple fractures    from falls, fx rt. elbow, fx left wrist, bilateral ankles   Pneumonia 10/2016   Restrictive lung disease 06/30/2017   Ulcer    no current problems as of 11/22/21    Family History  Problem Relation Age of Onset   Emphysema Father    Dementia Mother    Diabetes Maternal Grandmother    Diabetes Maternal Aunt    Diabetes Other        mat. cousin   Heart disease Brother    Colon cancer Neg Hx    Allergic rhinitis Neg Hx    Angioedema Neg Hx    Asthma Neg Hx    Atopy Neg Hx    Eczema Neg Hx    Immunodeficiency Neg Hx    Urticaria Neg Hx     Past Surgical History:  Procedure Laterality Date   ABDOMINAL HYSTERECTOMY     BSO   APPENDECTOMY     BIOPSY  04/04/2022   Procedure: BIOPSY;  Surgeon: Wilhelmenia, Aloha Raddle., MD;  Location: [facility];  Service: Gastroenterology;;   CESAREAN SECTION     x 2   CHOLECYSTECTOMY     CHONDROPLASTY Left 01/06/2015   Procedure: CHONDROPLASTY;  Surgeon: Oneil JAYSON Herald, MD;  Location: [facility];  Service: Orthopedics;  Laterality: Left;   COLONOSCOPY     CT CTA CORONARY W/CA SCORE W/CM &/OR WO/CM  12/02/2016   CORONARY CALCIUM  SCORE 0.  NO EVIDENCE OF CAD.  Normal-sized PA.  Normal pulmonary venous drainage the left atrium.  No ASD or VSD.   ELBOW FRACTURE SURGERY     right   ESOPHAGOGASTRODUODENOSCOPY (EGD) WITH PROPOFOL  N/A 04/04/2022   Procedure: ESOPHAGOGASTRODUODENOSCOPY (EGD) WITH PROPOFOL ;  Surgeon:  Wilhelmenia Aloha Raddle., MD;  Location: [facility]COPY;  Service: Gastroenterology;  Laterality: N/A;   EUS N/A 04/04/2022   Procedure: UPPER ENDOSCOPIC ULTRASOUND (EUS) RADIAL;  Surgeon: Wilhelmenia Aloha Raddle., MD;  Location[facility];  Service: Gastroenterology;  Laterality: N/A;   EYE SURGERY Bilateral    cataracts removed   INSERTION OF MESH N/A 11/12/2022   Procedure: INSERTION OF MESH;  Surgeon: Rubin Calamity, MD;  Location: [facility] OR;  Service: General;  Laterality: N/A;   KNEE ARTHROSCOPY WITH EXCISION PLICA Left 01/06/2015   Procedure: KNEE ARTHROSCOPY WITH EXCISION PLICA;  Surgeon: Oneil JAYSON Herald, MD;  Location: [facility];  Service: Orthopedics;  Laterality: Left;   LIGAMENT REPAIR Right 11/28/2021   Procedure: RIGHT THUMB LIGAMENT RECONSTRUCTION AND TENDON INTERPOSITION;  Surgeon: Romona Harari, MD;  Location: [facility] OR;  Service: Orthopedics;  Laterality: Right;   Lower Extremity Arterial Dopplers  07/06/2013   RABI 1.0, LABI 1.1.; No evidence of significant vascular atherogenic plaque   NECK SURGERY     NM MYOVIEW  LTD  09/2014   a) LOW RISK. Normal EF of 65%, no RWMA. NO ISCHEMIA OR INFARCTION.;; b) 05/02/2009: Normal study.  LOW RISK.  No ischemia or infarction.  EF 74%.  Breast attenuation noted.   TONSILLECTOMY     TRANSTHORACIC ECHOCARDIOGRAM  03/28/2021   a) Normal EF 60 to 65%.  GR 1 DD.  No R WMA.  Normal RV size and function.  Normal RAP.  Normal valves. => NORMAL;; b) 04/23/16/2010: Normal study.  LOW RISK.  No ischemia or infarction.  EF 74%.  Breast attenuation noted.17/2010: ([facility]) EF>55%.  Normal wall motion.  Normal valves.   WRIST FRACTURE SURGERY     left, has plate   XI ROBOTIC ASSISTED PARAESOPHAGEAL HERNIA REPAIR N/A 11/12/2022   Procedure: ROBOTIC DIAPHRAGMATIC HERNIA REPAIR WITH MESH;  Surgeon: Rubin Calamity, MD;  Location: [facility] OR;  Service: General;  Laterality: N/A;   Social History   Occupational History   Occupation: disable  Tobacco Use    Smoking status: Every Day    Current packs/day: 0.50    Average packs/day: 0.5 packs/day for 46.9 years (23.4 ttl pk-yrs)    Types: Cigarettes    Start date: 08/06/1977   Smokeless tobacco: Never   Tobacco comments:    Patient has cut back to 2-3 per day as of 11/22/21  Vaping Use   Vaping status: Never Used  Substance and Sexual Activity   Alcohol use: No    Alcohol/week: 0.0 standard drinks of alcohol   Drug use: No   Sexual activity: Not Currently    Birth control/protection: Surgical    Comment: Hysterectomy

## 2024-06-22 ENCOUNTER — Other Ambulatory Visit: Payer: Self-pay | Admitting: Internal Medicine

## 2024-06-29 ENCOUNTER — Other Ambulatory Visit: Payer: Self-pay | Admitting: Family

## 2024-06-30 DIAGNOSIS — L723 Sebaceous cyst: Secondary | ICD-10-CM | POA: Diagnosis not present

## 2024-06-30 DIAGNOSIS — Z961 Presence of intraocular lens: Secondary | ICD-10-CM | POA: Diagnosis not present

## 2024-06-30 DIAGNOSIS — H04123 Dry eye syndrome of bilateral lacrimal glands: Secondary | ICD-10-CM | POA: Diagnosis not present

## 2024-06-30 DIAGNOSIS — J302 Other seasonal allergic rhinitis: Secondary | ICD-10-CM | POA: Diagnosis not present

## 2024-06-30 NOTE — Progress Notes (Signed)
 VIALS MADE 06-30-24

## 2024-07-01 DIAGNOSIS — J3081 Allergic rhinitis due to animal (cat) (dog) hair and dander: Secondary | ICD-10-CM | POA: Diagnosis not present

## 2024-07-05 ENCOUNTER — Other Ambulatory Visit: Payer: Self-pay | Admitting: Internal Medicine

## 2024-07-05 NOTE — Telephone Encounter (Unsigned)
 Copied from CRM [id number]. Topic: Clinical - Medication Refill >> Jul 05, 2024 11:41 AM Precious C wrote: Medication: ALPRAZolam  (XANAX ) 1 MG tablet   Has the patient contacted their pharmacy? Yes, advise to call provider (Agent: If no, request that the patient contact the pharmacy for the refill. If patient does not wish to contact the pharmacy document the reason why and proceed with request.) (Agent: If yes, when and what did the pharmacy advise?)  This is the patient's preferred pharmacy:   [facility] [place]  [address] [place], [place] - [address] [place] [address] Phone: [phone] Fax: [phone]   Is this the correct pharmacy for this prescription? Yes If no, delete pharmacy and type the correct one.   Has the prescription been filled recently? No  Is the patient out of the medication? Yes  Has the patient been seen for an appointment in the last year OR does the patient have an upcoming appointment? Yes  Can we respond through MyChart? Yes  Agent: Please be advised that Rx refills may take up to 3 business days. We ask that you follow-up with your pharmacy.

## 2024-07-06 MED ORDER — ALPRAZOLAM 1 MG PO TABS: ORAL_TABLET | ORAL | 1 refills | Status: AC

## 2024-07-10 ENCOUNTER — Other Ambulatory Visit: Payer: Self-pay | Admitting: Internal Medicine

## 2024-07-16 ENCOUNTER — Telehealth: Payer: Self-pay | Admitting: Internal Medicine

## 2024-07-16 NOTE — Telephone Encounter (Signed)
 Copied from CRM #[id number]. Topic: Clinical - Prescription Issue >> Jul 16, 2024  2:11 PM Ashley R wrote: Reason for CRM:  ALPRAZolam  1 MG  Only received 60 day supply, requested 90 day supply.  [facility] [place]  [place], [place] - [address] Pharmacy requesting confirmation/callback  [phone]

## 2024-07-19 ENCOUNTER — Ambulatory Visit: Admitting: Orthopedic Surgery

## 2024-07-19 ENCOUNTER — Other Ambulatory Visit (INDEPENDENT_AMBULATORY_CARE_PROVIDER_SITE_OTHER)

## 2024-07-19 ENCOUNTER — Encounter: Payer: Self-pay | Admitting: Orthopedic Surgery

## 2024-07-19 DIAGNOSIS — M25572 Pain in left ankle and joints of left foot: Secondary | ICD-10-CM

## 2024-07-19 DIAGNOSIS — M79641 Pain in right hand: Secondary | ICD-10-CM

## 2024-07-19 DIAGNOSIS — M25571 Pain in right ankle and joints of right foot: Secondary | ICD-10-CM

## 2024-07-19 DIAGNOSIS — G8929 Other chronic pain: Secondary | ICD-10-CM | POA: Diagnosis not present

## 2024-07-19 DIAGNOSIS — M25531 Pain in right wrist: Secondary | ICD-10-CM

## 2024-07-19 NOTE — Progress Notes (Signed)
 Office Visit Note   Patient: Lori [name]           Date of Birth: [birth year]           MRN: [id number] Visit Date: 07/19/2024              Requested by: Norleen Lynwood ORN, MD [address] [place],  [place] [address] PCP: Norleen Lynwood ORN, MD  Chief Complaint  Patient presents with   Right Ankle - Follow-up   Left Ankle - Follow-up   Right Wrist - Pain      HPI: Discussed the use of AI scribe software for clinical note transcription with the patient, who gave verbal consent to proceed.  History of Present Illness Lori [name] is a 68 year old female who presents with right wrist pain following previous surgery.  She experiences persistent pain in her right wrist following surgery performed in March 2023. The procedure involved resection of the trapezius with right flexor carpi radialis transfer and tendon interposition. The pain extends down the wrist, and she is unable to fully extend it.  X-rays have been performed, and she feels significant discomfort in the wrist. The pain causes agitation and difficulty using the wrist, as it tends to contract, preventing full extension.  She has tried various treatments, including Voltaren  gel, but the wrist continues to tighten, making it difficult to use. She previously had a half cast and stitches removed.     Assessment & Plan: Visit Diagnoses:  1. Pain in right wrist   2. Chronic pain of both ankles   3. Pain in right hand     Plan: Assessment and Plan Assessment & Plan Right wrist pain and dysfunction following trapezium resection and tendon interposition Persistent pain and dysfunction post-surgery with radiographic evidence of collapse across the interposition space but good alignment. Limited range of motion and pain with grip strength. - Referred to Dr. Romona for further evaluation and management. - Provided thumb spica splint for support.      Follow-Up Instructions: No follow-ups on file.   Ortho  Exam  Patient is alert, oriented, no adenopathy, well-dressed, normal affect, normal respiratory effort. Physical Exam MUSCULOSKELETAL: Right wrist is globally tender to palpation at the base of the thumb. Limited range of motion of the right thumb. Grip strength is painful in the right hand.  Patient has good range of motion both ankles and no pain with activities.      Imaging: No results found. No images are attached to the encounter.  Labs: Lab Results  Component Value Date   HGBA1C 5.8 04/29/2024   HGBA1C 5.4 12/29/2023   HGBA1C 5.3 04/09/2023   ESRSEDRATE 18 06/30/2017   ESRSEDRATE 17 10/06/2012   ESRSEDRATE 21 12/08/2007   CRP 0.5 06/30/2017   CRP 1.5 10/06/2012   LABURIC 4.3 05/31/2024   REPTSTATUS 01/20/2024 FINAL 01/15/2024   CULT  01/15/2024    NO GROWTH 5 DAYS Performed at [facility], [address], [place], [place] [address]    LABORGA NO GROWTH 06/21/2016     Lab Results  Component Value Date   ALBUMIN 4.3 04/29/2024   ALBUMIN 3.8 01/23/2024   ALBUMIN 4.0 01/23/2024    Lab Results  Component Value Date   MG 2.1 01/18/2024   MG 1.7 06/30/2023   MG 2.1 05/16/2023   Lab Results  Component Value Date   VD25OH 51.02 01/16/2024   VD25OH 41.11 12/29/2023   VD25OH 13.50 (L) 04/09/2023  No results found for: PREALBUMIN    Latest Ref Rng & Units 04/29/2024   10:53 AM 01/23/2024    7:35 PM 01/23/2024   10:09 AM  CBC EXTENDED  WBC 4.0 - 10.5 K/uL 7.4  18.6  19.8 Repeated and verified X2.   RBC 3.87 - 5.11 Mil/uL 4.45  4.38  4.54   Hemoglobin 12.0 - 15.0 g/dL 87.1  86.2  86.1   HCT 36.0 - 46.0 % 38.2  40.0  41.8   Platelets 150.0 - 400.0 K/uL 284.0  360  384.0   NEUT# 1.4 - 7.7 K/uL 4.2  9.7  12.7   Lymph# 0.7 - 4.0 K/uL 2.7  7.4  5.5      There is no height or weight on file to calculate BMI.  Orders:  Orders Placed This Encounter  Procedures   XR Wrist Complete Right   No orders of the defined types were placed in this  encounter.    Procedures: No procedures performed  Clinical Data: No additional findings.  ROS:  All other systems negative, except as noted in the HPI. Review of Systems  Objective: Vital Signs: There were no vitals taken for this visit.  Specialty Comments:  No specialty comments available.  PMFS History: Patient Active Problem List   Diagnosis Date Noted   Diarrhea 02/03/2024   Leucocytosis 01/23/2024   Asthma, chronic obstructive, with acute exacerbation (HCC) 01/15/2024   Lower back pain 12/29/2023   Osteoporosis 12/29/2023   Chest pain 06/08/2023   Hypomagnesemia 05/15/2023   Upper abdominal pain 09/19/2022   Constipation 09/19/2022   Lower abdominal pain 06/25/2022   COVID-19 virus infection 06/09/2022   Pain in left ankle and joints of left foot 05/15/2022   Weight loss 01/10/2022   Arthritis of carpometacarpal (CMC) joint of right thumb 10/25/2021   Right-sided chest pain 09/05/2021   Neck pain 04/11/2021   Thoracic spine pain 04/11/2021   Low back pain 04/11/2021   Bilateral hip pain 04/11/2021   AKI (acute kidney injury) 04/11/2021   Allergic rhinitis 01/08/2021   Abnormal urine odor 11/14/2020   Aortic atherosclerosis 11/14/2020   Severe persistent asthma without complication (HCC) 11/02/2020   Hymenoptera allergy 11/02/2020   Genital herpes 11/12/2019   Coccyx pain 11/12/2019   Insomnia 11/12/2019   Acute right-sided low back pain without sciatica 07/20/2019   Grief 07/20/2019   Vitamin D  deficiency 07/20/2019   Hyperglycemia 04/21/2019   Left knee pain 04/21/2019   Pain 12/29/2018   B12 deficiency 10/20/2018   Bilateral hand pain 08/03/2018   Nausea 04/27/2018   Anemia, iron  deficiency 04/27/2018   Intermittent paresthesia of hand and foot 04/27/2018   Headache 03/21/2018   Lumbar radiculitis 08/15/2017   Degenerative disc disease, lumbar 07/21/2017   Encounter for well adult exam with abnormal findings 06/30/2017   Epigastric pain  06/30/2017   Restrictive lung disease 06/30/2017   Leg cramping 06/09/2017   Elbow contusion 06/09/2017   Right elbow pain 05/27/2017   Asthma-COPD overlap syndrome (HCC) 01/31/2017   Seasonal and perennial allergic rhinitis 01/01/2017   Moderate persistent asthma, uncomplicated 01/01/2017   Pulmonary emphysema (HCC) 12/03/2016   Tobacco abuse 12/03/2016   Non-seasonal allergic rhinitis due to fungal spores 12/03/2016   Medication management 11/29/2016   Rash 11/23/2016   Gastroesophageal reflux disease    Community acquired pneumonia of left lower lobe of lung 10/26/2016   Vertigo 10/22/2016   Gastroesophageal reflux disease with esophagitis 10/22/2016   Non compliance w medication regimen  08/01/2016   Allergic contact dermatitis due to metals 08/01/2016   Migraine without aura and without status migrainosus, not intractable 07/22/2016   Spasm of muscle of lower back 06/21/2016   Chronic rhinitis 06/19/2016   Compliance poor 06/19/2016   Gait difficulty 06/21/2015   Fibromyalgia 06/21/2015   Conversion reaction 06/21/2015   Anxiety and depression 06/21/2015   Syncope and collapse 06/21/2015   Plica syndrome of left knee 01/06/2015   Anxiety state 09/06/2014   History of MI 09/06/2014   Cigarette smoker 06/27/2013   Borderline hypertension 06/27/2013   Chronic low back pain with sciatica 06/14/2013   Leg pain, bilateral 06/14/2013   Gastroparesis 10/26/2009   Musculoskeletal chest pain 05/02/2009   Hyperlipidemia LDL goal <100 11/18/2008   COPD exacerbation (HCC) 11/18/2008   HEPATIC CYST 11/18/2008   Past Medical History:  Diagnosis Date   Allergic rhinitis    Anemia    hgb 13.9 on 11/22/21   Ankle fracture 05/2016   Anxiety    Arthritis    Asthma    Barrett esophagus 2007   Chest pain    Hx, no current problems as of 11/22/21.  Recurrent episodes.  Has had 3 negative nuclear stress test, and a completely normal coronary CT angiogram   COPD (chronic obstructive  pulmonary disease) with chronic bronchitis (HCC)    & Emphysema   Depression    Duodenitis without mention of hemorrhage 2007   Esophageal reflux 2007   Esophageal stricture    Full dentures    patient does not wear dentures as of 11/22/21   H/O hiatal hernia    Hiatal hernia 2006,2007   HSV infection    Hyperlipidemia    rx crestor  but pt not taking   Multiple fractures    from falls, fx rt. elbow, fx left wrist, bilateral ankles   Pneumonia 10/2016   Restrictive lung disease 06/30/2017   Ulcer    no current problems as of 11/22/21    Family History  Problem Relation Age of Onset   Emphysema Father    Dementia Mother    Diabetes Maternal Grandmother    Diabetes Maternal Aunt    Diabetes Other        mat. cousin   Heart disease Brother    Colon cancer Neg Hx    Allergic rhinitis Neg Hx    Angioedema Neg Hx    Asthma Neg Hx    Atopy Neg Hx    Eczema Neg Hx    Immunodeficiency Neg Hx    Urticaria Neg Hx     Past Surgical History:  Procedure Laterality Date   ABDOMINAL HYSTERECTOMY     BSO   APPENDECTOMY     BIOPSY  04/04/2022   Procedure: BIOPSY;  Surgeon: Wilhelmenia, Aloha Raddle., MD;  Location: [facility];  Service: Gastroenterology;;   CESAREAN SECTION     x 2   CHOLECYSTECTOMY     CHONDROPLASTY Left 01/06/2015   Procedure: CHONDROPLASTY;  Surgeon: Oneil JAYSON Herald, MD;  Location: [facility];  Service: Orthopedics;  Laterality: Left;   COLONOSCOPY     CT CTA CORONARY W/CA SCORE W/CM &/OR WO/CM  12/02/2016   CORONARY CALCIUM  SCORE 0.  NO EVIDENCE OF CAD.  Normal-sized PA.  Normal pulmonary venous drainage the left atrium.  No ASD or VSD.   ELBOW FRACTURE SURGERY     right   ESOPHAGOGASTRODUODENOSCOPY (EGD) WITH PROPOFOL  N/A 04/04/2022   Procedure: ESOPHAGOGASTRODUODENOSCOPY (EGD) WITH PROPOFOL ;  Surgeon: Wilhelmenia Aloha  Mickey., MD;  Location: [facility]COPY;  Service: Gastroenterology;  Laterality: N/A;   EUS N/A 04/04/2022   Procedure: UPPER ENDOSCOPIC  ULTRASOUND (EUS) RADIAL;  Surgeon: Wilhelmenia Aloha Mickey., MD;  Location: [facility] ENDOSCOPY;  Service: Gastroenterology;  Laterality: N/A;   EYE SURGERY Bilateral    cataracts removed   INSERTION OF MESH N/A 11/12/2022   Procedure: INSERTION OF MESH;  Surgeon: Rubin Calamity, MD;  Location: [facility] OR;  Service: General;  Laterality: N/A;   KNEE ARTHROSCOPY WITH EXCISION PLICA Left 01/06/2015   Procedure: KNEE ARTHROSCOPY WITH EXCISION PLICA;  Surgeon: Oneil JAYSON Herald, MD;  Location: [facility];  Service: Orthopedics;  Laterality: Left;   LIGAMENT REPAIR Right 11/28/2021   Procedure: RIGHT THUMB LIGAMENT RECONSTRUCTION AND TENDON INTERPOSITION;  Surgeon: Romona Harari, MD;  Location: [facility] OR;  Service: Orthopedics;  Laterality: Right;   Lower Extremity Arterial Dopplers  07/06/2013   RABI 1.0, LABI 1.1.; No evidence of significant vascular atherogenic plaque   NECK SURGERY     NM MYOVIEW  LTD  09/2014   a) LOW RISK. Normal EF of 65%, no RWMA. NO ISCHEMIA OR INFARCTION.;; b) 05/02/2009: Normal study.  LOW RISK.  No ischemia or infarction.  EF 74%.  Breast attenuation noted.   TONSILLECTOMY     TRANSTHORACIC ECHOCARDIOGRAM  03/28/2021   a) Normal EF 60 to 65%.  GR 1 DD.  No R WMA.  Normal RV size and function.  Normal RAP.  Normal valves. => NORMAL;; b) 04/23/16/2010: Normal study.  LOW RISK.  No ischemia or infarction.  EF 74%.  Breast attenuation noted.17/2010: ([facility]) EF>55%.  Normal wall motion.  Normal valves.   WRIST FRACTURE SURGERY     left, has plate   XI ROBOTIC ASSISTED PARAESOPHAGEAL HERNIA REPAIR N/A 11/12/2022   Procedure: ROBOTIC DIAPHRAGMATIC HERNIA REPAIR WITH MESH;  Surgeon: Rubin Calamity, MD;  Location: [facility] OR;  Service: General;  Laterality: N/A;   Social History   Occupational History   Occupation: disable  Tobacco Use   Smoking status: Every Day    Current packs/day: 0.50    Average packs/day: 0.5 packs/day for 47.0 years (23.5 ttl pk-yrs)    Types: Cigarettes     Start date: 08/06/1977   Smokeless tobacco: Never   Tobacco comments:    Patient has cut back to 2-3 per day as of 11/22/21  Vaping Use   Vaping status: Never Used  Substance and Sexual Activity   Alcohol use: No    Alcohol/week: 0.0 standard drinks of alcohol   Drug use: No   Sexual activity: Not Currently    Birth control/protection: Surgical    Comment: Hysterectomy

## 2024-07-28 ENCOUNTER — Ambulatory Visit

## 2024-07-28 ENCOUNTER — Other Ambulatory Visit: Payer: Self-pay | Admitting: Internal Medicine

## 2024-07-28 DIAGNOSIS — J309 Allergic rhinitis, unspecified: Secondary | ICD-10-CM | POA: Diagnosis not present

## 2024-07-28 DIAGNOSIS — J3089 Other allergic rhinitis: Secondary | ICD-10-CM

## 2024-08-03 ENCOUNTER — Ambulatory Visit

## 2024-08-03 DIAGNOSIS — J4550 Severe persistent asthma, uncomplicated: Secondary | ICD-10-CM

## 2024-08-10 ENCOUNTER — Ambulatory Visit: Payer: Self-pay

## 2024-08-10 NOTE — Telephone Encounter (Signed)
 FYI Only or Action Required?: Action required by provider: medication refill request.  Patient was last seen in primary care on 04/29/2024 by Norleen Lynwood ORN, MD.  Called Nurse Triage reporting Lymphadenopathy.  Symptoms began several days ago.  Interventions attempted: Nothing.  Symptoms are: stable.2 swollen glands in neck, no other symptoms. Declines OV.Dr. Norleen always calls in Amoxicillin  for me. Please advise pt.  Triage Disposition: See Physician Within 24 Hours  Patient/caregiver understands and will follow disposition?: No, wishes to speak with PCP     Copied from CRM #[id number]. Topic: Clinical - Red Word Triage >> Aug 10, 2024  8:00 AM Franky GRADE wrote: Red Word that prompted transfer to Nurse Triage: Patient is calling requesting antibiotics for swollen glands. Reason for Disposition  [1] Single large node AND [2] size > 1 inch (2.5 cm) AND [3] no fever  Answer Assessment - Initial Assessment Questions 1. LOCATION: Where is the swollen node located? Is the matching node on the other side of the body also swollen?      neck 2. SIZE: How big is the node? (e.g., inches or centimeters; or compared to common objects such as pea, bean, marble, golf ball)      2 3. ONSET: When did the swelling start?      2 days 4. NECK NODES: Is there a sore throat, runny nose or other symptoms of a cold?      no 5. GROIN OR ARMPIT NODES: Is there a sore, scratch, cut or painful red area on that arm or leg?      no 6. FEVER: Do you have a fever? If Yes, ask: What is it, how was it measured, and when did it start?      no 7. CAUSE: What do you think is causing the swollen lymph nodes?     unsure 8. OTHER SYMPTOMS: Do you have any other symptoms? (e.g., node is tender to touch, skin redness over node, weight changes)     no 9. PREGNANCY: Is there any chance you are pregnant? When was your last menstrual period?     no  Protocols used: Lymph Nodes - Swollen-A-AH

## 2024-08-11 NOTE — Telephone Encounter (Signed)
 Unfortunately we do not normally prescribe antibiotic for this on request as the reason the LA would need to be assessed.  Please consider ROV or if she feels it cannot wait - ok for UC or ED     thanks

## 2024-08-16 NOTE — Telephone Encounter (Signed)
 Pt has an appointment scheduled.

## 2024-08-24 ENCOUNTER — Ambulatory Visit: Admitting: Internal Medicine

## 2024-08-25 ENCOUNTER — Telehealth: Payer: Self-pay | Admitting: Allergy & Immunology

## 2024-08-25 NOTE — Telephone Encounter (Signed)
 Left voicemail to give the office a call back to schedule [facility] reapproval appointment.

## 2024-08-26 ENCOUNTER — Ambulatory Visit: Admitting: Internal Medicine

## 2024-08-26 NOTE — Telephone Encounter (Signed)
 Patient called back, I got her scheduled to see Dr. Iva on 10/05/24 in the [place] office.

## 2024-08-27 NOTE — Telephone Encounter (Signed)
 Called patient and left voicemail to reschedule appointment. We need to try to get her in before January as her authorization expires 12/31.

## 2024-08-30 ENCOUNTER — Ambulatory Visit

## 2024-08-30 DIAGNOSIS — J302 Other seasonal allergic rhinitis: Secondary | ICD-10-CM

## 2024-08-30 DIAGNOSIS — J309 Allergic rhinitis, unspecified: Secondary | ICD-10-CM

## 2024-09-06 ENCOUNTER — Telehealth: Payer: Self-pay

## 2024-09-06 ENCOUNTER — Other Ambulatory Visit: Payer: Self-pay | Admitting: Family Medicine

## 2024-09-06 DIAGNOSIS — B009 Herpesviral infection, unspecified: Secondary | ICD-10-CM

## 2024-09-06 NOTE — Telephone Encounter (Signed)
 Copied from CRM [id number]. Topic: Clinical - Prescription Issue >> Sep 06, 2024  4:15 PM Viola F wrote: Reason for CRM: Patient called regarding thevalACYclovir (VALTREX ) 500 - Pharmacy only gave 14 pills and it's suppose to be 120 pills since she takes it twice a day. She needs more pills sent to the [facility] [address] [place], [place] - [address]

## 2024-09-07 NOTE — Telephone Encounter (Signed)
 Please verify with patient, But I believe was taking this due to recurring problem with mouth cold sores or genital herpes.   Both of these issues are tx usually with the rx already provided,  We do not normally take valtrex  4 pills per day for a whole month.   thanks

## 2024-09-08 ENCOUNTER — Ambulatory Visit: Payer: Self-pay | Admitting: Internal Medicine

## 2024-09-08 ENCOUNTER — Telehealth: Payer: Self-pay

## 2024-09-08 MED ORDER — TRAMADOL HCL 50 MG PO TABS: 50.0000 mg | ORAL_TABLET | Freq: Four times a day (QID) | ORAL | 2 refills | Status: AC | PRN

## 2024-09-08 NOTE — Telephone Encounter (Signed)
 FYI Only or Action Required?: Action required by provider: Requesting pain medication.  Patient was last seen in primary care on 04/29/2024 by Norleen Lynwood ORN, MD.  Called Nurse Triage reporting Back Pain.  Symptoms began several years ago.  Interventions attempted: Prescription medications: Tizanidine , Tramadol .  Symptoms are: stable.  Triage Disposition: See HCP Within 4 Hours (Or PCP Triage)  Patient/caregiver understands and will follow disposition?: Yes Reason for Disposition  [1] SEVERE back pain (e.g., excruciating, unable to do any normal activities) AND [2] not improved 2 hours after pain medicine  Answer Assessment - Initial Assessment Questions Patient reports falling the other day and took 15 minutes to get up. Patient reports tiZANidine  does not work, reports being out of Tramadol , finds mild relief with that but causes constipation. Patient is requesting Percocet today or something stronger. She also stated PCP was supposed to call her, as she has called multiple times asking about her Valtrex  and how she needs 120 pills but the pharmacy is only giving 14. She is requesting Perocet and Valtrex  be sent to [facility] on file. Please advise   1. ONSET: When did the pain begin? (e.g., minutes, hours, days)     Years  2. LOCATION: Where does it hurt? (upper, mid or lower back)     Mid back  3. SEVERITY: How bad is the pain?  (e.g., Scale 1-10; mild, moderate, or severe)     Burning and stabbing, over 10/10, affecting sleep  4. PATTERN: Is the pain constant? (e.g., yes, no; constant, intermittent)      Constant  5. RADIATION: Does the pain shoot into your legs or somewhere else?     Denies  6. MEDICINES: What have you taken so far for the pain? (e.g., nothing, acetaminophen , NSAIDS)     tiZANidine  (ZANAFLEX ) 2 MG tablet, traMADol  (ULTRAM ) 50 MG tablet  7. NEUROLOGIC SYMPTOMS: Do you have any weakness, numbness, or problems with bowel/bladder  control?     Denies  8. OTHER SYMPTOMS: Do you have any other symptoms? (e.g., fever, abdomen pain, burning with urination, blood in urine)       Denies  Protocols used: Back Pain-A-AH  Copied from CRM #[id number]. Topic: Clinical - Red Word Triage >> Sep 08, 2024  8:34 AM Harlene ORN wrote: Red Word that prompted transfer to Nurse Triage: burning and stinging in the middle of her back. Took three muscle spasm medications for treatment this morning, but they are not effective.

## 2024-09-08 NOTE — Addendum Note (Signed)
 Addended by: NORLEEN LYNWOOD ORN on: 09/08/2024 09:55 PM   Modules accepted: Orders

## 2024-09-08 NOTE — Telephone Encounter (Signed)
 Copied from CRM [id number]. Topic: Clinical - Prescription Issue >> Sep 06, 2024  4:15 PM Viola F wrote: Reason for CRM: Patient called regarding thevalACYclovir (VALTREX ) 500 - Pharmacy only gave 14 pills and it's suppose to be 120 pills since she takes it twice a day. She needs more pills sent to the [facility] [address] [place], [place] - [address] >> Sep 07, 2024  4:31 PM Rosina D wrote: Patient called stating she told the provider to order her 120 pills of valtrex  but she received 14 pills and that will not work because she takes it twice a day [phone] >> Sep 07, 2024  9:19 AM Viola F wrote: Patient called to follow up on this - she would like a call or text to let her know when this is done

## 2024-09-08 NOTE — Telephone Encounter (Signed)
 Very sorry, but I am unable to accommodate the oxycodone  request as I no longer treat chronic pain with medications higher schedule than tramadol .    I have refilled the tramadol , but let me know if pt would want referral to pain management    thanks

## 2024-09-08 NOTE — Telephone Encounter (Signed)
 Very sorry, I am unable to contact pt in consultation.   Pt would need ROV or let us  know by message her reason for the valtrex  quantity as she mentioned    thanks

## 2024-09-08 NOTE — Telephone Encounter (Signed)
 Patient called to follow up on her previous message. Would like to discuss this with his PCP directly.

## 2024-09-10 ENCOUNTER — Telehealth: Payer: Self-pay

## 2024-09-10 NOTE — Telephone Encounter (Signed)
 Copied from CRM #[id number]. Topic: General - Other >> Sep 10, 2024  9:05 AM Wess RAMAN wrote: Reason for CRM: Patient was informed she would get a call back before 12pm on 12/24 but did not. She would like to speak with Dr. Norleen about her medications.  Callback #: [phone]

## 2024-09-10 NOTE — Telephone Encounter (Signed)
 Attempted to reach pt in regards to the following per PCP needing information as follows Very sorry, I am unable to contact pt in consultation.   Pt would need ROV or let us  know by message her reason for the valtrex  quantity as she mentioned    thanks

## 2024-09-10 NOTE — Telephone Encounter (Signed)
 Please inform the pt of the following per her PCP as follows Very sorry, I am unable to contact pt in consultation.   Pt would need ROV or let us  know by message her reason for the valtrex  quantity as she mentioned    thanks

## 2024-09-13 ENCOUNTER — Ambulatory Visit: Payer: Self-pay

## 2024-09-13 NOTE — Telephone Encounter (Signed)
 FYI Only or Action Required?: Action required by provider: booked appointment outside dispo 09/15/2024 pt requesting PCP only .  Patient was last seen in primary care on 04/29/2024 by Norleen Lynwood ORN, MD.  Called Nurse Triage reporting Cough.  Symptoms began several weeks ago.  Interventions attempted: OTC medications: otc cough medicine .  Symptoms are: gradually worsening.  Triage Disposition: See Physician Within 24 Hours  Patient/caregiver understands and will follow disposition?: No, refuses disposition            Reason for Disposition  SEVERE coughing spells (e.g., whooping sound after coughing, vomiting after coughing)  Answer Assessment - Initial Assessment Questions Patient reports christmas day experienced 2 hours of fast heart rate after dinner had ham. This is resolved and not happening now. Reports ongoing concerns for hands shaking , this  is not new and has happened before but has been for last 3 days. Pt states runs in family mentions  cough 3-4 weeks that is dry can't get the stuff up waking up at night coughing. Sever coughing fits, chest pain only when coughing.  Has some wheezing using inhaler. RN advised appointment for evaluation patient states only wants to see PCP , booked soonest PCP appointment 09/15/2024 patient advised UC/ER if worsening symptoms prior to appointment patient verbalized understanding agrees  with UC /ER if worsening      1. ONSET: When did the cough begin?      More than 3 weeks  2. SEVERITY: How bad is the cough today?      Severe coughing fits at nightb  3. SPUTUM: Describe the color of your sputum (e.g., none, dry cough; clear, white, yellow, green)     Dry cough 4. HEMOPTYSIS: Are you coughing up any blood? If so ask: How much? (e.g., flecks, streaks, tablespoons, etc.)     Denies  5. DIFFICULTY BREATHING: Are you having difficulty breathing? If Yes, ask: How bad is it? (e.g., mild, moderate, severe)       Denies shortness of breath beyond baselin e 6. FEVER: Do you have a fever? If Yes, ask: What is your temperature, how was it measured, and when did it start?     denies 7. CARDIAC HISTORY: Do you have any history of heart disease? (e.g., heart attack, congestive heart failure)      Denies  8. LUNG HISTORY: Do you have any history of lung disease?  (e.g., pulmonary embolus, asthma, emphysema)     Has Asthma-COPD overlap syndrome (HCC) 9. PE RISK FACTORS: Do you have a history of blood clots? (or: recent major surgery, recent prolonged travel, bedridden)     Denies  10. OTHER SYMPTOMS: Do you have any other symptoms? (e.g., runny nose, wheezing, chest pain)       Wheezing some, only chest pain with cough Patient denies the following  chest with cough chest pain, difficulty breathing beyond baseline , fever  Protocols used: Cough - Chronic-A-AH  Copied from CRM #[id number]. Topic: Clinical - Red Word Triage >> Sep 13, 2024  9:31 AM Wess RAMAN wrote: Red Word that prompted transfer to Nurse Triage: Heart was racing on Christmas day after eating ham but not anymore. Still shaking really bad especially in her hands.

## 2024-09-15 ENCOUNTER — Encounter: Payer: Self-pay | Admitting: Internal Medicine

## 2024-09-15 ENCOUNTER — Ambulatory Visit: Admitting: Internal Medicine

## 2024-09-15 VITALS — BP 122/80 | HR 65 | Temp 98.3°F | Ht 64.0 in | Wt 124.0 lb

## 2024-09-15 DIAGNOSIS — A6000 Herpesviral infection of urogenital system, unspecified: Secondary | ICD-10-CM | POA: Diagnosis not present

## 2024-09-15 DIAGNOSIS — B009 Herpesviral infection, unspecified: Secondary | ICD-10-CM

## 2024-09-15 DIAGNOSIS — R059 Cough, unspecified: Secondary | ICD-10-CM | POA: Insufficient documentation

## 2024-09-15 DIAGNOSIS — E559 Vitamin D deficiency, unspecified: Secondary | ICD-10-CM

## 2024-09-15 DIAGNOSIS — J4489 Other specified chronic obstructive pulmonary disease: Secondary | ICD-10-CM

## 2024-09-15 DIAGNOSIS — R051 Acute cough: Secondary | ICD-10-CM | POA: Diagnosis not present

## 2024-09-15 MED ORDER — METHYLPREDNISOLONE ACETATE 80 MG/ML IJ SUSP
80.0000 mg | Freq: Once | INTRAMUSCULAR | Status: AC
  Administered 2024-09-15: 80 mg via INTRAMUSCULAR

## 2024-09-15 MED ORDER — HYDROCODONE BIT-HOMATROP MBR 5-1.5 MG/5ML PO SOLN: 5.0000 mL | Freq: Four times a day (QID) | ORAL | 0 refills | Status: AC | PRN

## 2024-09-15 MED ORDER — PREDNISONE 10 MG PO TABS: ORAL_TABLET | ORAL | 0 refills | Status: AC

## 2024-09-15 MED ORDER — CEFDINIR 300 MG PO CAPS: 300.0000 mg | ORAL_CAPSULE | Freq: Two times a day (BID) | ORAL | 0 refills | Status: DC

## 2024-09-15 MED ORDER — TRELEGY ELLIPTA 100-62.5-25 MCG/ACT IN AEPB: 1.0000 | INHALATION_SPRAY | Freq: Every day | RESPIRATORY_TRACT | 11 refills | Status: DC

## 2024-09-15 MED ORDER — VALACYCLOVIR HCL 500 MG PO TABS: 1000.0000 mg | ORAL_TABLET | Freq: Two times a day (BID) | ORAL | 1 refills | Status: DC

## 2024-09-15 NOTE — Assessment & Plan Note (Signed)
 Last vitamin D  Lab Results  Component Value Date   VD25OH 51.02 01/16/2024   Stable, cont oral replacement

## 2024-09-15 NOTE — Assessment & Plan Note (Signed)
 Pt with recurring multiple outbreaks per year - ok for preventive valtrex  asd

## 2024-09-15 NOTE — Assessment & Plan Note (Addendum)
 Mild to mod, can't r/o pna vs other, declines cxr, now for antibx course omnicef  300 bid course, and cough med prn,,  to f/u any worsening symptoms or concerns

## 2024-09-15 NOTE — Progress Notes (Signed)
 Patient ID: Lori [name], female   DOB: Nov 04, 1955, 68 y.o.   MRN: [id number]        Chief Complaint: follow up genital herpes, cough, wheezing / asthma, low vit d       HPI:  Lori [name] is a 68 y.o. female Here with acute onset mild to mod 2-3 days ST, HA, general weakness and malaise, with prod cough greenish sputum and mild sob wheezingm, but Pt denies chest pain, increased sob or doe, wheezing, orthopnea, PND, increased LE swelling, palpitations, dizziness or syncope. Pt admits may not be taking the breztri  as prescribed due to jitteriness that seems to get worse.   Pt denies polydipsia, polyuria, or new focal neuro s/s.   Also has recurring muliple outbreaks genital herpes per yr, asks for preventive tx.         Wt Readings from Last 3 Encounters:  09/15/24 124 lb (56.2 kg)  04/29/24 125 lb (56.7 kg)  02/03/24 117 lb (53.1 kg)   BP Readings from Last 3 Encounters:  09/15/24 122/80  04/29/24 118/82  02/03/24 122/78         Past Medical History:  Diagnosis Date   Allergic rhinitis    Anemia    hgb 13.9 on 11/22/21   Ankle fracture 05/2016   Anxiety    Arthritis    Asthma    Barrett esophagus 2007   Chest pain    Hx, no current problems as of 11/22/21.  Recurrent episodes.  Has had 3 negative nuclear stress test, and a completely normal coronary CT angiogram   COPD (chronic obstructive pulmonary disease) with chronic bronchitis (HCC)    & Emphysema   Depression    Duodenitis without mention of hemorrhage 2007   Esophageal reflux 2007   Esophageal stricture    Full dentures    patient does not wear dentures as of 11/22/21   H/O hiatal hernia    Hiatal hernia 2006,2007   HSV infection    Hyperlipidemia    rx crestor  but pt not taking   Multiple fractures    from falls, fx rt. elbow, fx left wrist, bilateral ankles   Pneumonia 10/2016   Restrictive lung disease 06/30/2017   Ulcer    no current problems as of 11/22/21   Past Surgical History:  Procedure Laterality  Date   ABDOMINAL HYSTERECTOMY     BSO   APPENDECTOMY     BIOPSY  04/04/2022   Procedure: BIOPSY;  Surgeon: Wilhelmenia Aloha Raddle., MD;  Location: [facility] ENDOSCOPY;  Service: Gastroenterology;;   CESAREAN SECTION     x 2   CHOLECYSTECTOMY     CHONDROPLASTY Left 01/06/2015   Procedure: CHONDROPLASTY;  Surgeon: Oneil JAYSON Herald, MD;  Location:  SURGERY CENTER;  Service: Orthopedics;  Laterality: Left;   COLONOSCOPY     CT CTA CORONARY W/CA SCORE W/CM &/OR WO/CM  12/02/2016   CORONARY CALCIUM  SCORE 0.  NO EVIDENCE OF CAD.  Normal-sized PA.  Normal pulmonary venous drainage the left atrium.  No ASD or VSD.   ELBOW FRACTURE SURGERY     right   ESOPHAGOGASTRODUODENOSCOPY (EGD) WITH PROPOFOL  N/A 04/04/2022   Procedure: ESOPHAGOGASTRODUODENOSCOPY (EGD) WITH PROPOFOL ;  Surgeon: Wilhelmenia Aloha Raddle., MD;  Location: [facility] ENDOSCOPY;  Service: Gastroenterology;  Laterality: N/A;   EUS N/A 04/04/2022   Procedure: UPPER ENDOSCOPIC ULTRASOUND (EUS) RADIAL;  Surgeon: Wilhelmenia Aloha Raddle., MD;  Location: [facility] ENDOSCOPY;  Service: Gastroenterology;  Laterality: N/A;   EYE SURGERY Bilateral  cataracts removed   INSERTION OF MESH N/A 11/12/2022   Procedure: INSERTION OF MESH;  Surgeon: Rubin Calamity, MD;  Location: [facility] OR;  Service: General;  Laterality: N/A;   KNEE ARTHROSCOPY WITH EXCISION PLICA Left 01/06/2015   Procedure: KNEE ARTHROSCOPY WITH EXCISION PLICA;  Surgeon: Oneil JAYSON Herald, MD;  Location: [facility];  Service: Orthopedics;  Laterality: Left;   LIGAMENT REPAIR Right 11/28/2021   Procedure: RIGHT THUMB LIGAMENT RECONSTRUCTION AND TENDON INTERPOSITION;  Surgeon: Romona Harari, MD;  Location: [facility] OR;  Service: Orthopedics;  Laterality: Right;   Lower Extremity Arterial Dopplers  07/06/2013   RABI 1.0, LABI 1.1.; No evidence of significant vascular atherogenic plaque   NECK SURGERY     NM MYOVIEW  LTD  09/2014   a) LOW RISK. Normal EF of 65%, no RWMA. NO ISCHEMIA OR  INFARCTION.;; b) 05/02/2009: Normal study.  LOW RISK.  No ischemia or infarction.  EF 74%.  Breast attenuation noted.   TONSILLECTOMY     TRANSTHORACIC ECHOCARDIOGRAM  03/28/2021   a) Normal EF 60 to 65%.  GR 1 DD.  No R WMA.  Normal RV size and function.  Normal RAP.  Normal valves. => NORMAL;; b) 04/23/16/2010: Normal study.  LOW RISK.  No ischemia or infarction.  EF 74%.  Breast attenuation noted.17/2010: ([facility]) EF>55%.  Normal wall motion.  Normal valves.   WRIST FRACTURE SURGERY     left, has plate   XI ROBOTIC ASSISTED PARAESOPHAGEAL HERNIA REPAIR N/A 11/12/2022   Procedure: ROBOTIC DIAPHRAGMATIC HERNIA REPAIR WITH MESH;  Surgeon: Rubin Calamity, MD;  Location: [facility] OR;  Service: General;  Laterality: N/A;    reports that she has been smoking cigarettes. She started smoking about 47 years ago. She has a 23.6 pack-year smoking history. She has never used smokeless tobacco. She reports that she does not drink alcohol and does not use drugs. family history includes Dementia in her mother; Diabetes in her maternal aunt, maternal grandmother, and another family member; Emphysema in her father; Heart disease in her brother. Allergies[1] Medications Ordered Prior to Encounter[2]      ROS:  All others reviewed and negative.  Objective        PE:  BP 122/80 (BP Location: Right Arm, Patient Position: Sitting, Cuff Size: Normal)   Pulse 65   Temp 98.3 F (36.8 C) (Oral)   Ht 5' 4 (1.626 m)   Wt 124 lb (56.2 kg)   SpO2 98%   BMI 21.28 kg/m                 Constitutional: Pt appears mild ill               HENT: Head: NCAT.                Right Ear: External ear normal.                 Left Ear: External ear normal. Bilat tm's with mild erythema.  Max sinus areas mild tender.  Pharynx with mild erythema, no exudate               Eyes: . Pupils are equal, round, and reactive to light. Conjunctivae and EOM are normal               Nose: without d/c or deformity               Neck: Neck supple.  Gross normal ROM  Cardiovascular: Normal rate and regular rhythm.                 Pulmonary/Chest: Effort normal and breath sounds decreased without rales but with few mild bilateral wheezing.                               Neurological: Pt is alert. At baseline orientation, motor grossly intact               Skin: Skin is warm. No rashes, no other new lesions, LE edema - none               Psychiatric: Pt behavior is normal without agitation   Micro: none  Cardiac tracings I have personally interpreted today:  none  Pertinent Radiological findings (summarize): none   Lab Results  Component Value Date   WBC 7.4 04/29/2024   HGB 12.8 04/29/2024   HCT 38.2 04/29/2024   PLT 284.0 04/29/2024   GLUCOSE 72 04/29/2024   CHOL 264 (H) 12/29/2023   TRIG 312.0 (H) 12/29/2023   HDL 42.00 12/29/2023   LDLDIRECT 178.0 04/09/2023   LDLCALC 159 (H) 12/29/2023   ALT 8 04/29/2024   AST 12 04/29/2024   NA 138 04/29/2024   K 3.7 04/29/2024   CL 104 04/29/2024   CREATININE 1.08 04/29/2024   BUN 9 04/29/2024   CO2 27 04/29/2024   TSH 2.26 12/29/2023   INR 0.9 08/31/2008   HGBA1C 5.8 04/29/2024   Assessment/Plan:  Lori [name] is a 68 y.o. White or Caucasian [1] female with  has a past medical history of Allergic rhinitis, Anemia, Ankle fracture (05/2016), Anxiety, Arthritis, Asthma, Barrett esophagus (2007), Chest pain, COPD (chronic obstructive pulmonary disease) with chronic bronchitis (HCC), Depression, Duodenitis without mention of hemorrhage (2007), Esophageal reflux (2007), Esophageal stricture, Full dentures, H/O hiatal hernia, Hiatal hernia (7993,7992), HSV infection, Hyperlipidemia, Multiple fractures, Pneumonia (10/2016), Restrictive lung disease (06/30/2017), and Ulcer.  Vitamin D  deficiency Last vitamin D  Lab Results  Component Value Date   VD25OH 51.02 01/16/2024   Stable, cont oral replacement   Genital herpes Pt with recurring multiple outbreaks per year -  ok for preventive valtrex  asd  Cough Mild to mod, can't r/o pna vs other, declines cxr, now for antibx course omnicef  300 bid course, and cough med prn,,  to f/u any worsening symptoms or concerns  Asthma-COPD overlap syndrome (HCC) With mild exacerbation, and not taking breztri  asd due to jitteriness - ok for prednisone  taper, but also change the breztri  to trelegy for hopefull better tolerability, also for depomedrol 80 mg IM x 1 today  Followup: Return if symptoms worsen or fail to improve.  Lynwood Rush, MD 09/15/2024 9:12 AM [facility]     [1]  Allergies Allergen Reactions   Advil  [Ibuprofen ] Nausea And Vomiting   Bee Venom Swelling   Codeine  Nausea Only and Other (See Comments)    CAUSES ULCERS   Clonidine And Derivatives Other (See Comments)    Unknown    Doxycycline  Nausea Only   Other Other (See Comments)    Pet dander and mold: Takes shots to address these   Statins Other (See Comments)    unknown   Latex Rash   Levaquin  [Levofloxacin ] Nausea Only   Propoxyphene N-Acetaminophen  Nausea Only  [2]  Current Outpatient Medications on File Prior to Visit  Medication Sig Dispense Refill  AIMOVIG 140 MG/ML SOAJ Inject 140 mg into the skin every 30 (thirty) days.     albuterol  (VENTOLIN  HFA) 108 (90 Base) MCG/ACT inhaler INHALE 2 PUFFS INTO LUNGS EVERY 6 HOURS AS NEEDED FOR WHEEZING OR SHORTNESS OF BREATH *NEW PRESCRIPTION REQUEST* 8.5 g 11   ALPRAZolam  (XANAX ) 1 MG tablet TAKE 1 TABLET BY MOUTH AT BEDTIME AS NEEDED FOR ANXIETY 90 tablet 1   amoxicillin -clavulanate (AUGMENTIN ) 875-125 MG tablet Take 1 tablet by mouth 2 (two) times daily. 20 tablet 0   benzonatate  (TESSALON ) 100 MG capsule Take 1 capsule (100 mg total) by mouth 3 (three) times daily. 90 capsule 1   busPIRone  (BUSPAR ) 10 MG tablet Take 1 tablet (10 mg total) by mouth 2 (two) times daily. 60 tablet 2   cetirizine  (ZYRTEC  ALLERGY) 10 MG tablet  take1 tablet by mouth once a day as needed for runny nose/drainage down throat (Patient taking differently: Take 10 mg by mouth in the morning.) 30 tablet 3   colchicine  0.6 MG tablet Take 1 tablet (0.6 mg total) by mouth daily. 30 tablet 3   cyclobenzaprine  (FLEXERIL ) 5 MG tablet TAKE 1 TABLET BY MOUTH 3 TIMES DAILY AS NEEDED FOR MUSCLE SPASMS 40 tablet 11   dexlansoprazole  (DEXILANT ) 60 MG capsule TAKE 1 CAPSULE(S) BY MOUTH 1 TIMES PER DAY *NEW PRESCRIPTION REQUEST* 90 capsule 3   dextromethorphan -guaiFENesin  (MUCINEX  DM) 30-600 MG 12hr tablet Take 1 tablet by mouth 2 (two) times daily. 20 tablet 0   diphenoxylate -atropine  (LOMOTIL ) 2.5-0.025 MG tablet Take 1 tablet by mouth 4 (four) times daily as needed for diarrhea or loose stools. 30 tablet 0   EPINEPHRINE  0.3 mg/0.3 mL IJ SOAJ injection INJECT 0.3MG  INTRAMUSCULARLY AS NEEDED FOR ANAPHYLAXIS. DO NOT REFRIGERATE 2 each 11   FASENRA  30 MG/ML prefilled syringe INJECT 30MG  SUBCUTANEOUSLY EVERY 8 WEEKS 1 mL 6   FLUoxetine  (PROZAC ) 20 MG capsule TAKE 1 CAPSULE BY MOUTH EVERY DAY 90 capsule 3   gabapentin  (NEURONTIN ) 600 MG tablet Take 600 mg by mouth daily in the afternoon.     levalbuterol  (XOPENEX  HFA) 45 MCG/ACT inhaler Inhale 2 puffs into the lungs every 4 (four) hours as needed for wheezing. 15 g 3   meclizine  (ANTIVERT ) 12.5 MG tablet TAKE 1 TABLET BY MOUTH THREE TIMES DAILY AS NEEDED FOR DIZZINESS 60 tablet 0   montelukast  (SINGULAIR ) 10 MG tablet TAKE 1 TABLET BY MOUTH AT BEDTIME *NEW PRESCRIPTION REQUEST* 90 tablet 3   nitroGLYCERIN  (NITROSTAT ) 0.4 MG SL tablet Place 1 tablet (0.4 mg total) under the tongue every 5 (five) minutes as needed for chest pain. 25 tablet 3   ondansetron  (ZOFRAN ) 4 MG tablet Take 1 tablet (4 mg total) by mouth every 8 (eight) hours as needed for nausea or vomiting. 20 tablet 0   promethazine  (PHENERGAN ) 12.5 MG tablet TAKE 1 TABLET BY MOUTH EVERY 8 HOURS AS NEEDED FOR NAUSEA FOR VOMITING *NEW PRESCRIPTION REQUEST*  270 tablet 3   risedronate  (ACTONEL ) 35 MG tablet Take 1 tablet (35 mg total) by mouth every 7 (seven) days. with water  on empty stomach, nothing by mouth or lie down for next 30 minutes. 12 tablet 3   tiZANidine  (ZANAFLEX ) 2 MG tablet TAKE 1 TABLET BY MOUTH EVERY 8 HOURS AS NEEDED FOR MUSCLE SPASMS 270 tablet 3   traMADol  (ULTRAM ) 50 MG tablet Take 1 tablet (50 mg total) by mouth every 6 (six) hours as needed. 60 tablet 2   zolpidem  (AMBIEN ) 5 MG tablet TAKE 1 TABLET BY MOUTH  AT BEDTIME AS NEEDED FOR SLEEP 90 tablet 1   nitroGLYCERIN  (NITROSTAT ) 0.4 MG SL tablet Place 1 tablet (0.4 mg total) under the tongue every 5 (five) minutes as needed for chest pain. 7 tablet 0   Current Facility-Administered Medications on File Prior to Visit  Medication Dose Route Frequency Provider Last Rate Last Admin   benralizumab  (FASENRA ) prefilled syringe 30 mg  30 mg Subcutaneous Q8 Weeks Gallagher, Joel Louis, MD   30 mg at 08/03/24 [phone]

## 2024-09-15 NOTE — Telephone Encounter (Signed)
 This has been addressed at Pt OV today.

## 2024-09-15 NOTE — Patient Instructions (Signed)
 You had the steroid shot today  Please take all new medication as prescribed - the antibiotic, cough medicine, prednisone   Ok to try change of Breztri  to the Trelegy due to the jitteriness  Please continue all other medications as before, and refills have been done if requested.  Please have the pharmacy call with any other refills you may need.  Please continue your efforts at being more active, low cholesterol diet, and weight control.  Please keep your appointments with your specialists as you may have planned

## 2024-09-15 NOTE — Assessment & Plan Note (Addendum)
 With mild exacerbation, and not taking breztri  asd due to jitteriness - ok for prednisone  taper, but also change the breztri  to trelegy for hopefull better tolerability, also for depomedrol 80 mg IM x 1 today

## 2024-09-15 NOTE — Telephone Encounter (Signed)
 Copied from CRM #[id number]. Topic: Clinical - Prescription Issue >> Sep 15, 2024 11:19 AM Lori [name] wrote: Reason for CRM: Patient stated her insurance will not cover taking valACYclovir  (VALTREX ) 500 MG tablet 4x per day, she is needing PCP to change this to taking medication 2x per day instead which they would cover.

## 2024-09-15 NOTE — Addendum Note (Signed)
 Addended by: WONDA BETTER on: 09/15/2024 09:20 AM   Modules accepted: Orders

## 2024-09-17 ENCOUNTER — Telehealth: Payer: Self-pay

## 2024-09-17 ENCOUNTER — Other Ambulatory Visit (HOSPITAL_COMMUNITY): Payer: Self-pay

## 2024-09-17 MED ORDER — VALACYCLOVIR HCL 500 MG PO TABS: 500.0000 mg | ORAL_TABLET | Freq: Two times a day (BID) | ORAL | 5 refills | Status: DC

## 2024-09-17 NOTE — Telephone Encounter (Signed)
 Pharmacy Patient Advocate Encounter   Received notification from CoverMyMeds that prior authorization for Valacyclovir  500mg  tabs is required/requested.   Insurance verification completed.   The patient is insured through [facility].   Per test claim: PA required; PA submitted to above mentioned insurance via Latent Key/confirmation #/EOC [id number] Status is pending

## 2024-09-17 NOTE — Telephone Encounter (Signed)
 Ok this was changed to bid

## 2024-09-20 ENCOUNTER — Other Ambulatory Visit (HOSPITAL_COMMUNITY): Payer: Self-pay

## 2024-09-20 MED ORDER — VALACYCLOVIR HCL 1 G PO TABS: 1000.0000 mg | ORAL_TABLET | Freq: Two times a day (BID) | ORAL | 3 refills | Status: AC

## 2024-09-20 NOTE — Telephone Encounter (Signed)
 Ok sent to [employer]

## 2024-09-20 NOTE — Telephone Encounter (Signed)
 Pharmacy Patient Advocate Encounter  Received notification from [employer] that Prior Authorization for Valacyclovir  500mg  tabs has been DENIED.  See denial reason below. No denial letter attached in CMM. Will attach denial letter to Media tab once received.   PA #/Case ID/Reference #: [id number]    Per test claim, Valacyclovir  1000mg  #60 to take 1 tablet by mouth twice daily, the copay is $1.60. Please send in a new script to the pharmacy for the 1gm tabs.

## 2024-09-20 NOTE — Addendum Note (Signed)
 Addended by: NORLEEN LYNWOOD ORN on: 09/20/2024 03:36 PM   Modules accepted: Orders

## 2024-09-21 ENCOUNTER — Other Ambulatory Visit: Payer: Self-pay | Admitting: Internal Medicine

## 2024-09-21 ENCOUNTER — Ambulatory Visit: Payer: Self-pay

## 2024-09-21 NOTE — Telephone Encounter (Signed)
 Copied from CRM [id number]. Topic: Clinical - Medication Refill >> Sep 21, 2024  1:42 PM Aleatha C wrote: Medication:  promethazine  (PHENERGAN ) 12.5 MG tablet Has the patient contacted their pharmacy? Yes (Agent: If no, request that the patient contact the pharmacy for the refill. If patient does not wish to contact the pharmacy document the reason why and proceed with request.) (Agent: If yes, when and what did the pharmacy advise?)  This is the patient's preferred pharmacy:  [facility] [place]  [place], [place] - [address] [place] [address] Phone: [phone] Fax: [phone]    Is this the correct pharmacy for this prescription? Yes If no, delete pharmacy and type the correct one.   Has the prescription been filled recently? No  Is the patient out of the medication? Yes  Has the patient been seen for an appointment in the last year OR does the patient have an upcoming appointment? Yes  Can we respond through MyChart? No  Agent: Please be advised that Rx refills may take up to 3 business days. We ask that you follow-up with your pharmacy.

## 2024-09-21 NOTE — Telephone Encounter (Signed)
 FYI Only or Action Required?: FYI only for provider: appointment scheduled on 09/23/24.  Patient was last seen in primary care on 09/15/2024 by Norleen Lynwood ORN, MD.  Called Nurse Triage reporting Cough and Wheezing.  Symptoms began several months ago.  Interventions attempted: Prescription medications: Hydrocodone  bit-homatropine.  Symptoms are: gradually worsening.  Triage Disposition: See Within 3 Days in Office (overriding See HCP Within 4 Hours (Or PCP Triage))  Patient/caregiver understands and will follow disposition?: Yes  Reason for Disposition  [1] MILD difficulty breathing (e.g., minimal/no SOB at rest, SOB with walking, pulse < 100) AND [2] still present when not coughing  Answer Assessment - Initial Assessment Questions Patient was seen in office on 1/2 for this cough and states that she was prescribed cough medicine, but it isn't helping. She states that the cough is worse with mild SOB and wheezing. Office visit advised for today, but no available appts with PCP. Does not want to go to UC. Scheduled for soonest available appt with her PCP. Advised to call back if symptoms worsen.   1. ONSET: When did the cough begin?      About 3 mos ago  2. SEVERITY: How bad is the cough today?      Severe  3. SPUTUM: Describe the color of your sputum (e.g., none, dry cough; clear, white, yellow, green)     Very little white mucous  4. HEMOPTYSIS: Are you coughing up any blood? If Yes, ask: How much? (e.g., flecks, streaks, tablespoons, etc.)     No  5. DIFFICULTY BREATHING: Are you having difficulty breathing? If Yes, ask: How bad is it? (e.g., mild, moderate, severe)      Mild SOB  6. FEVER: Do you have a fever? If Yes, ask: What is your temperature, how was it measured, and when did it start?     No  7. CARDIAC HISTORY: Do you have any history of heart disease? (e.g., heart attack, congestive heart failure)      MI  8. LUNG HISTORY: Do you have any history  of lung disease?  (e.g., pulmonary embolus, asthma, emphysema)     COPD-Asthma  9. PE RISK FACTORS: Do you have a history of blood clots? (or: recent major surgery, recent prolonged travel, bedridden)     No  10. OTHER SYMPTOMS: Do you have any other symptoms? (e.g., runny nose, wheezing, chest pain)       Wheezing  11. PREGNANCY: Is there any chance you are pregnant? When was your last menstrual period?       NA  12. TRAVEL: Have you traveled out of the country in the last month? (e.g., travel history, exposures)       No  Protocols used: Cough - Acute Non-Productive-A-AH  Copied from CRM #[id number]. Topic: Clinical - Red Word Triage >> Sep 21, 2024  4:07 PM Joesph NOVAK wrote: Red Word that prompted transfer to Nurse Triage: SOB, wheezing, coughing for 3 months, she would like to see her pcp Dr.John before he leaves.

## 2024-09-22 MED ORDER — PROMETHAZINE HCL 12.5 MG PO TABS: 12.5000 mg | ORAL_TABLET | Freq: Four times a day (QID) | ORAL | 1 refills | Status: AC | PRN

## 2024-09-23 ENCOUNTER — Ambulatory Visit: Admitting: Internal Medicine

## 2024-09-28 ENCOUNTER — Ambulatory Visit

## 2024-09-28 DIAGNOSIS — J302 Other seasonal allergic rhinitis: Secondary | ICD-10-CM | POA: Diagnosis not present

## 2024-09-28 DIAGNOSIS — J4550 Severe persistent asthma, uncomplicated: Secondary | ICD-10-CM | POA: Diagnosis not present

## 2024-10-01 ENCOUNTER — Encounter: Payer: Self-pay | Admitting: Orthopedic Surgery

## 2024-10-01 ENCOUNTER — Ambulatory Visit (INDEPENDENT_AMBULATORY_CARE_PROVIDER_SITE_OTHER): Admitting: Orthopedic Surgery

## 2024-10-01 ENCOUNTER — Other Ambulatory Visit: Payer: Self-pay | Admitting: Internal Medicine

## 2024-10-01 ENCOUNTER — Other Ambulatory Visit (INDEPENDENT_AMBULATORY_CARE_PROVIDER_SITE_OTHER): Payer: Self-pay

## 2024-10-01 DIAGNOSIS — M5440 Lumbago with sciatica, unspecified side: Secondary | ICD-10-CM | POA: Diagnosis not present

## 2024-10-01 DIAGNOSIS — M542 Cervicalgia: Secondary | ICD-10-CM

## 2024-10-01 NOTE — Progress Notes (Signed)
 "  Office Visit Note   Patient: Lori [name]           Date of Birth: April 22, 1956           MRN: [id number] Visit Date: 10/01/2024 Requested by: Norleen Lynwood ORN, MD [address] [place],  [place] [address] PCP: Norleen Lynwood ORN, MD  Subjective: Chief Complaint  Patient presents with   Neck - Pain   Lower Back - Pain    HPI: Lori [name] is a 69 y.o. female who presents to the office reporting mild low back pain and neck pain.  Patient fell backwards a few weeks ago.  Has a history of prior neck surgery.  Does take muscle relaxers as well as over-the-counter medication.  Reports some neck pain but no real radicular symptoms in the arms or legs..                ROS: All systems reviewed are negative as they relate to the chief complaint within the history of present illness.  Patient denies fevers or chills.  Assessment & Plan: Visit Diagnoses:  1. Acute bilateral low back pain with sciatica, sciatica laterality unspecified   2. Neck pain     Plan: Impression is low back and neck pain following a fall.  No evidence of hardware complication or radiographic abnormality in the neck.  In the lumbar spine there is no radiographic compression fractures but some arthritis is present.  No indication for further intervention unless she remains symptomatic longer than 4 more weeks.  Follow-up with us  as needed  Follow-Up Instructions: No follow-ups on file.   Orders:  Orders Placed This Encounter  Procedures   XR Lumb Spine Flex&Ext Only   XR Cervical Spine 2 or 3 views   No orders of the defined types were placed in this encounter.     Procedures: No procedures performed   Clinical Data: No additional findings.  Objective: Vital Signs: There were no vitals taken for this visit.  Physical Exam:  Constitutional: Patient appears well-developed HEENT:  Head: Normocephalic Eyes:EOM are normal Neck: Normal range of motion Cardiovascular: Normal rate Pulmonary/chest: Effort  normal Neurologic: Patient is alert Skin: Skin is warm Psychiatric: Patient has normal mood and affect  Ortho Exam: Ortho exam demonstrates diminished cervical spine flexion extension and rotation proportional to the patient's significant cervical spine history.  She has 5 out of 5 grip EPL FPL interosseous wrist lection extension bicep triceps and deltoid strength with no hyperreflexia and no paresthesias.  Lumbar spine exam demonstrates mild pain with forward and lateral bending but no nerve root tension signs.  5 out of 5 ankle dorsiflexion plantarflexion quad and hamstring strength.  No hyperreflexia or clonus.  Good muscle tone in the bilateral lower extremities.  No groin pain with internal or external rotation of either leg. Specialty Comments:  No specialty comments available.  Imaging: XR Cervical Spine 2 or 3 views Result Date: 10/01/2024 AP lateral radiographs cervical spine reviewed.  Prior cervical spine fusion hardware is noted in good position and alignment.  There is loss of lordosis from multiple cervical spine fusion segments.  Some adjacent segment disease is present proximal and distal to the fusion mass.  XR Lumb Spine Flex&Ext Only Result Date: 10/01/2024 Flexion extension lateral radiographs lumbar spine reviewed.  No spondylolisthesis or compression fractures noted.  Moderate degenerative changes noted throughout the lower lumbar spine between the vertebral bodies and in the facet joints.    PMFS History: Patient  Active Problem List   Diagnosis Date Noted   Cough 09/15/2024   Diarrhea 02/03/2024   Leucocytosis 01/23/2024   Asthma, chronic obstructive, with acute exacerbation (HCC) 01/15/2024   Lower back pain 12/29/2023   Osteoporosis 12/29/2023   Chest pain 06/08/2023   Hypomagnesemia 05/15/2023   Upper abdominal pain 09/19/2022   Constipation 09/19/2022   Lower abdominal pain 06/25/2022   COVID-19 virus infection 06/09/2022   Pain in left ankle and joints of  left foot 05/15/2022   Weight loss 01/10/2022   Arthritis of carpometacarpal (CMC) joint of right thumb 10/25/2021   Right-sided chest pain 09/05/2021   Neck pain 04/11/2021   Thoracic spine pain 04/11/2021   Low back pain 04/11/2021   Bilateral hip pain 04/11/2021   AKI (acute kidney injury) 04/11/2021   Allergic rhinitis 01/08/2021   Abnormal urine odor 11/14/2020   Aortic atherosclerosis 11/14/2020   Severe persistent asthma without complication (HCC) 11/02/2020   Hymenoptera allergy 11/02/2020   Genital herpes 11/12/2019   Coccyx pain 11/12/2019   Insomnia 11/12/2019   Acute right-sided low back pain without sciatica 07/20/2019   Grief 07/20/2019   Vitamin D  deficiency 07/20/2019   Hyperglycemia 04/21/2019   Left knee pain 04/21/2019   Pain 12/29/2018   B12 deficiency 10/20/2018   Bilateral hand pain 08/03/2018   Nausea 04/27/2018   Anemia, iron  deficiency 04/27/2018   Intermittent paresthesia of hand and foot 04/27/2018   Headache 03/21/2018   Lumbar radiculitis 08/15/2017   Degenerative disc disease, lumbar 07/21/2017   Encounter for well adult exam with abnormal findings 06/30/2017   Epigastric pain 06/30/2017   Restrictive lung disease 06/30/2017   Leg cramping 06/09/2017   Elbow contusion 06/09/2017   Right elbow pain 05/27/2017   Asthma-COPD overlap syndrome (HCC) 01/31/2017   Seasonal and perennial allergic rhinitis 01/01/2017   Moderate persistent asthma, uncomplicated 01/01/2017   Pulmonary emphysema (HCC) 12/03/2016   Tobacco abuse 12/03/2016   Non-seasonal allergic rhinitis due to fungal spores 12/03/2016   Medication management 11/29/2016   Rash 11/23/2016   Gastroesophageal reflux disease    Community acquired pneumonia of left lower lobe of lung 10/26/2016   Vertigo 10/22/2016   Gastroesophageal reflux disease with esophagitis 10/22/2016   Non compliance w medication regimen 08/01/2016   Allergic contact dermatitis due to metals 08/01/2016    Migraine without aura and without status migrainosus, not intractable 07/22/2016   Spasm of muscle of lower back 06/21/2016   Chronic rhinitis 06/19/2016   Compliance poor 06/19/2016   Gait difficulty 06/21/2015   Fibromyalgia 06/21/2015   Conversion reaction 06/21/2015   Anxiety and depression 06/21/2015   Syncope and collapse 06/21/2015   Plica syndrome of left knee 01/06/2015   Anxiety state 09/06/2014   History of MI 09/06/2014   Cigarette smoker 06/27/2013   Borderline hypertension 06/27/2013   Chronic low back pain with sciatica 06/14/2013   Leg pain, bilateral 06/14/2013   Gastroparesis 10/26/2009   Musculoskeletal chest pain 05/02/2009   Hyperlipidemia LDL goal <100 11/18/2008   COPD exacerbation (HCC) 11/18/2008   HEPATIC CYST 11/18/2008   Past Medical History:  Diagnosis Date   Allergic rhinitis    Anemia    hgb 13.9 on 11/22/21   Ankle fracture 05/2016   Anxiety    Arthritis    Asthma    Barrett esophagus 2007   Chest pain    Hx, no current problems as of 11/22/21.  Recurrent episodes.  Has had 3 negative nuclear stress test, and a completely  normal coronary CT angiogram   COPD (chronic obstructive pulmonary disease) with chronic bronchitis (HCC)    & Emphysema   Depression    Duodenitis without mention of hemorrhage 2007   Esophageal reflux 2007   Esophageal stricture    Full dentures    patient does not wear dentures as of 11/22/21   H/O hiatal hernia    Hiatal hernia 2006,2007   HSV infection    Hyperlipidemia    rx crestor  but pt not taking   Multiple fractures    from falls, fx rt. elbow, fx left wrist, bilateral ankles   Pneumonia 10/2016   Restrictive lung disease 06/30/2017   Ulcer    no current problems as of 11/22/21    Family History  Problem Relation Age of Onset   Emphysema Father    Dementia Mother    Diabetes Maternal Grandmother    Diabetes Maternal Aunt    Diabetes Other        mat. cousin   Heart disease Brother    Colon cancer Neg  Hx    Allergic rhinitis Neg Hx    Angioedema Neg Hx    Asthma Neg Hx    Atopy Neg Hx    Eczema Neg Hx    Immunodeficiency Neg Hx    Urticaria Neg Hx     Past Surgical History:  Procedure Laterality Date   ABDOMINAL HYSTERECTOMY     BSO   APPENDECTOMY     BIOPSY  04/04/2022   Procedure: BIOPSY;  Surgeon: Wilhelmenia, Aloha Raddle., MD;  Location: [facility];  Service: Gastroenterology;;   CESAREAN SECTION     x 2   CHOLECYSTECTOMY     CHONDROPLASTY Left 01/06/2015   Procedure: CHONDROPLASTY;  Surgeon: Oneil JAYSON Herald, MD;  Location: [facility];  Service: Orthopedics;  Laterality: Left;   COLONOSCOPY     CT CTA CORONARY W/CA SCORE W/CM &/OR WO/CM  12/02/2016   CORONARY CALCIUM  SCORE 0.  NO EVIDENCE OF CAD.  Normal-sized PA.  Normal pulmonary venous drainage the left atrium.  No ASD or VSD.   ELBOW FRACTURE SURGERY     right   ESOPHAGOGASTRODUODENOSCOPY (EGD) WITH PROPOFOL  N/A 04/04/2022   Procedure: ESOPHAGOGASTRODUODENOSCOPY (EGD) WITH PROPOFOL ;  Surgeon: Wilhelmenia Aloha Raddle., MD;  Location: [facility] END[facility];  Service: Gastroenterology;  Laterality: N/A;   EUS N/A 04/04/2022   Procedure: UPPER ENDOSCOPIC ULTRASOUND (EUS) RADIAL;  Surgeon: Wilhelmenia Aloha Raddle., MD;  Location: [facility] ENDOSCOPY;  Service: Gastroenterology;  Laterality: N/A;   EYE SURGERY Bilateral    cataracts removed   INSERTION OF MESH N/A 11/12/2022   Procedure: INSERTION OF MESH;  Surgeon: Rubin Calamity, MD;  Location: [facility] OR;  Service: General;  Laterality: N/A;   KNEE ARTHROSCOPY WITH EXCISION PLICA Left 01/06/2015   Procedure: KNEE ARTHROSCOPY WITH EXCISION PLICA;  Surgeon: Oneil JAYSON Herald, MD;  Location:  SURGERY CENTER;  Service: Orthopedics;  Laterality: Left;   LIGAMENT REPAIR Right 11/28/2021   Procedure: RIGHT THUMB LIGAMENT RECONSTRUCTION AND TENDON INTERPOSITION;  Surgeon: Romona Harari, MD;  Location: [facility] OR;  Service: Orthopedics;  Laterality: Right;   Lower Extremity Arterial  Dopplers  07/06/2013   RABI 1.0, LABI 1.1.; No evidence of significant vascular atherogenic plaque   NECK SURGERY     NM MYOVIEW  LTD  09/2014   a) LOW RISK. Normal EF of 65%, no RWMA. NO ISCHEMIA OR INFARCTION.;; b) 05/02/2009: Normal study.  LOW RISK.  No ischemia or infarction.  EF 74%.  Breast  attenuation noted.   TONSILLECTOMY     TRANSTHORACIC ECHOCARDIOGRAM  03/28/2021   a) Normal EF 60 to 65%.  GR 1 DD.  No R WMA.  Normal RV size and function.  Normal RAP.  Normal valves. => NORMAL;; b) 04/23/16/2010: Normal study.  LOW RISK.  No ischemia or infarction.  EF 74%.  Breast attenuation noted.17/2010: ([facility]) EF>55%.  Normal wall motion.  Normal valves.   WRIST FRACTURE SURGERY     left, has plate   XI ROBOTIC ASSISTED PARAESOPHAGEAL HERNIA REPAIR N/A 11/12/2022   Procedure: ROBOTIC DIAPHRAGMATIC HERNIA REPAIR WITH MESH;  Surgeon: Rubin Calamity, MD;  Location: [facility] OR;  Service: General;  Laterality: N/A;   Social History   Occupational History   Occupation: disable  Tobacco Use   Smoking status: Every Day    Current packs/day: 0.50    Average packs/day: 0.5 packs/day for 47.2 years (23.6 ttl pk-yrs)    Types: Cigarettes    Start date: 08/06/1977   Smokeless tobacco: Never   Tobacco comments:    Patient has cut back to 2-3 per day as of 11/22/21  Vaping Use   Vaping status: Never Used  Substance and Sexual Activity   Alcohol use: No    Alcohol/week: 0.0 standard drinks of alcohol   Drug use: No   Sexual activity: Not Currently    Birth control/protection: Surgical    Comment: Hysterectomy        "

## 2024-10-04 ENCOUNTER — Telehealth: Payer: Self-pay | Admitting: Internal Medicine

## 2024-10-04 ENCOUNTER — Other Ambulatory Visit: Payer: Self-pay

## 2024-10-04 ENCOUNTER — Encounter: Payer: Self-pay | Admitting: Internal Medicine

## 2024-10-04 ENCOUNTER — Telehealth: Payer: Self-pay | Admitting: Orthopedic Surgery

## 2024-10-04 ENCOUNTER — Ambulatory Visit: Payer: Self-pay | Admitting: Internal Medicine

## 2024-10-04 ENCOUNTER — Ambulatory Visit: Admitting: Internal Medicine

## 2024-10-04 ENCOUNTER — Ambulatory Visit

## 2024-10-04 VITALS — BP 126/78 | HR 90 | Temp 97.9°F | Ht 64.0 in | Wt 123.0 lb

## 2024-10-04 DIAGNOSIS — E559 Vitamin D deficiency, unspecified: Secondary | ICD-10-CM

## 2024-10-04 DIAGNOSIS — R051 Acute cough: Secondary | ICD-10-CM

## 2024-10-04 DIAGNOSIS — R739 Hyperglycemia, unspecified: Secondary | ICD-10-CM

## 2024-10-04 DIAGNOSIS — J441 Chronic obstructive pulmonary disease with (acute) exacerbation: Secondary | ICD-10-CM | POA: Diagnosis not present

## 2024-10-04 LAB — HEMOGLOBIN A1C: Hgb A1c MFr Bld: 5.6 % (ref 4.6–6.5)

## 2024-10-04 LAB — BASIC METABOLIC PANEL WITH GFR
BUN: 16 mg/dL (ref 6–23)
CO2: 24 meq/L (ref 19–32)
Calcium: 9.7 mg/dL (ref 8.4–10.5)
Chloride: 98 meq/L (ref 96–112)
Creatinine, Ser: 1.61 mg/dL — ABNORMAL HIGH (ref 0.40–1.20)
GFR: 32.77 mL/min — ABNORMAL LOW
Glucose, Bld: 103 mg/dL — ABNORMAL HIGH (ref 70–99)
Potassium: 4.1 meq/L (ref 3.5–5.1)
Sodium: 134 meq/L — ABNORMAL LOW (ref 135–145)

## 2024-10-04 LAB — HEPATIC FUNCTION PANEL
ALT: 11 U/L (ref 3–35)
AST: 15 U/L (ref 5–37)
Albumin: 4.5 g/dL (ref 3.5–5.2)
Alkaline Phosphatase: 92 U/L (ref 39–117)
Bilirubin, Direct: 0.1 mg/dL (ref 0.1–0.3)
Total Bilirubin: 0.6 mg/dL (ref 0.2–1.2)
Total Protein: 7.1 g/dL (ref 6.0–8.3)

## 2024-10-04 LAB — LIPID PANEL
Cholesterol: 320 mg/dL — ABNORMAL HIGH (ref 28–200)
HDL: 41.4 mg/dL
NonHDL: 278.94
Total CHOL/HDL Ratio: 8
Triglycerides: 437 mg/dL — ABNORMAL HIGH (ref 10.0–149.0)
VLDL: 87.4 mg/dL — ABNORMAL HIGH (ref 0.0–40.0)

## 2024-10-04 LAB — CBC WITH DIFFERENTIAL/PLATELET
Basophils Absolute: 0 K/uL (ref 0.0–0.1)
Basophils Relative: 0.1 % (ref 0.0–3.0)
Eosinophils Absolute: 0 K/uL (ref 0.0–0.7)
Eosinophils Relative: 0 % (ref 0.0–5.0)
HCT: 40.3 % (ref 36.0–46.0)
Hemoglobin: 13.9 g/dL (ref 12.0–15.0)
Lymphocytes Relative: 25.9 % (ref 12.0–46.0)
Lymphs Abs: 3.2 K/uL (ref 0.7–4.0)
MCHC: 34.4 g/dL (ref 30.0–36.0)
MCV: 88.9 fl (ref 78.0–100.0)
Monocytes Absolute: 0.9 K/uL (ref 0.1–1.0)
Monocytes Relative: 7.1 % (ref 3.0–12.0)
Neutro Abs: 8.4 K/uL — ABNORMAL HIGH (ref 1.4–7.7)
Neutrophils Relative %: 66.9 % (ref 43.0–77.0)
Platelets: 371 K/uL (ref 150.0–400.0)
RBC: 4.53 Mil/uL (ref 3.87–5.11)
RDW: 14.6 % (ref 11.5–15.5)
WBC: 12.5 K/uL — ABNORMAL HIGH (ref 4.0–10.5)

## 2024-10-04 LAB — URINALYSIS, ROUTINE W REFLEX MICROSCOPIC
Nitrite: NEGATIVE
Specific Gravity, Urine: 1.025 (ref 1.000–1.030)
Total Protein, Urine: 100 — AB
Urine Glucose: NEGATIVE
Urobilinogen, UA: 0.2 (ref 0.0–1.0)
pH: 5.5 (ref 5.0–8.0)

## 2024-10-04 LAB — TSH: TSH: 2.62 u[IU]/mL (ref 0.35–5.50)

## 2024-10-04 LAB — LDL CHOLESTEROL, DIRECT: Direct LDL: 246 mg/dL

## 2024-10-04 MED ORDER — ALBUTEROL SULFATE HFA 108 (90 BASE) MCG/ACT IN AERS: 2.0000 | INHALATION_SPRAY | Freq: Four times a day (QID) | RESPIRATORY_TRACT | 11 refills | Status: AC | PRN

## 2024-10-04 MED ORDER — AMOXICILLIN-POT CLAVULANATE 875-125 MG PO TABS: 1.0000 | ORAL_TABLET | Freq: Two times a day (BID) | ORAL | 0 refills | Status: AC

## 2024-10-04 MED ORDER — METHYLPREDNISOLONE 4 MG PO TBPK: ORAL_TABLET | ORAL | 0 refills | Status: DC

## 2024-10-04 MED ORDER — HYDROCODONE BIT-HOMATROP MBR 5-1.5 MG/5ML PO SOLN: 5.0000 mL | Freq: Four times a day (QID) | ORAL | 0 refills | Status: AC | PRN

## 2024-10-04 MED ORDER — METHYLPREDNISOLONE ACETATE 80 MG/ML IJ SUSP
80.0000 mg | Freq: Once | INTRAMUSCULAR | Status: AC
  Administered 2024-10-04: 80 mg via INTRAMUSCULAR

## 2024-10-04 MED ORDER — TRELEGY ELLIPTA 100-62.5-25 MCG/ACT IN AEPB: 1.0000 | INHALATION_SPRAY | Freq: Every day | RESPIRATORY_TRACT | 11 refills | Status: AC

## 2024-10-04 NOTE — Telephone Encounter (Signed)
 No, ok to take the med as prescribed with the tapering dosing     Thanks!

## 2024-10-04 NOTE — Telephone Encounter (Signed)
 Copied from CRM [id number]. Topic: Clinical - Medication Question >> Oct 04, 2024 10:04 AM Porter L wrote: Reason for CRM: lisa from [facility] pharmacy called and wanted to know if the patient should follow the medicine instructions for  4 tab by mouth once daily x 3 days, then 2 tab x 3 days, then 1 tab x 3 days for methylPREDNISolone  (MEDROL  DOSEPAK) 4 MG TBPK tablet , or if the patient should be using single dose packs , please contact lisa back [phone]

## 2024-10-04 NOTE — Assessment & Plan Note (Deleted)
 Mild to mod, for antibx course augmentin  875 bid course, cough med prn, medrol  dosepak, depomedrol 80 mg IM,  to f/u any worsening symptoms or concerns, also for CXR r/o pna

## 2024-10-04 NOTE — Patient Instructions (Signed)
 You had the steroid shot today  Please take all new medication as prescribed - the antibiotic, cough medicine, and Medrol  DosePak  Please continue all other medications as before, and refills have been done for the albuterol  and the trelegy inhalers  Please have the pharmacy call with any other refills you may need.  Please keep your appointments with your specialists as you may have planned  Please go to the XRAY Department in the first floor for the x-ray testing  Please go to the LAB at the blood drawing area for the tests to be done  You will be contacted by phone if any changes need to be made immediately.  Otherwise, you will receive a letter about your results with an explanation, but please check with MyChart first.  Please make an Appointment to return in 6 months, or sooner if needed

## 2024-10-04 NOTE — Assessment & Plan Note (Signed)
 Mild to mod, for antibx course augmentin  875 bid course, cough med prn, medrol  dosepak, depomedrol 80 mg IM,  to f/u any worsening symptoms or concerns, also for CXR r/o pna

## 2024-10-04 NOTE — Telephone Encounter (Signed)
 Pt states she was to have a Rx called in on Friday from Dr Addie. Pt did not know the name just knows its something for her back

## 2024-10-04 NOTE — Assessment & Plan Note (Signed)
 Lab Results  Component Value Date   HGBA1C 5.8 04/29/2024   Stable, pt to continue current medical treatment  - diet, wt control

## 2024-10-04 NOTE — Assessment & Plan Note (Signed)
 Last vitamin D  Lab Results  Component Value Date   VD25OH 51.02 01/16/2024   Stable, cont oral replacement

## 2024-10-04 NOTE — Telephone Encounter (Signed)
 Ok for mobic  7.5 q d # 30 pls calal htrx

## 2024-10-04 NOTE — Telephone Encounter (Signed)
Ok this is done thanks

## 2024-10-04 NOTE — Progress Notes (Signed)
 Patient ID: Lori [name], female   DOB: 1955-12-07, 69 y.o.   MRN: [id number]        Chief Complaint: follow up copd exacerbation, hyperglycemia, low vit d       HPI:  Lori [name] is a 69 y.o. female here with c/o 3 days onset feverish, feeling ill, fatigued, prod cough greenish yellow associated with worsening wheezing, sob doe   Pt denies chest pain, orthopnea, PND, increased LE swelling, palpitations, dizziness or syncope.   Pt denies polydipsia, polyuria, or new focal neuro s/s.         Wt Readings from Last 3 Encounters:  10/04/24 123 lb (55.8 kg)  09/15/24 124 lb (56.2 kg)  04/29/24 125 lb (56.7 kg)   BP Readings from Last 3 Encounters:  10/04/24 126/78  09/15/24 122/80  04/29/24 118/82         Past Medical History:  Diagnosis Date   Allergic rhinitis    Anemia    hgb 13.9 on 11/22/21   Ankle fracture 05/2016   Anxiety    Arthritis    Asthma    Barrett esophagus 2007   Chest pain    Hx, no current problems as of 11/22/21.  Recurrent episodes.  Has had 3 negative nuclear stress test, and a completely normal coronary CT angiogram   COPD (chronic obstructive pulmonary disease) with chronic bronchitis (HCC)    & Emphysema   Depression    Duodenitis without mention of hemorrhage 2007   Esophageal reflux 2007   Esophageal stricture    Full dentures    patient does not wear dentures as of 11/22/21   H/O hiatal hernia    Hiatal hernia 2006,2007   HSV infection    Hyperlipidemia    rx crestor  but pt not taking   Multiple fractures    from falls, fx rt. elbow, fx left wrist, bilateral ankles   Pneumonia 10/2016   Restrictive lung disease 06/30/2017   Ulcer    no current problems as of 11/22/21   Past Surgical History:  Procedure Laterality Date   ABDOMINAL HYSTERECTOMY     BSO   APPENDECTOMY     BIOPSY  04/04/2022   Procedure: BIOPSY;  Surgeon: Wilhelmenia Aloha Raddle., MD;  Location: [facility] ENDOSCOPY;  Service: Gastroenterology;;   CESAREAN SECTION     x 2    CHOLECYSTECTOMY     CHONDROPLASTY Left 01/06/2015   Procedure: CHONDROPLASTY;  Surgeon: Oneil JAYSON Herald, MD;  Location: [facility];  Service: Orthopedics;  Laterality: Left;   COLONOSCOPY     CT CTA CORONARY W/CA SCORE W/CM &/OR WO/CM  12/02/2016   CORONARY CALCIUM  SCORE 0.  NO EVIDENCE OF CAD.  Normal-sized PA.  Normal pulmonary venous drainage the left atrium.  No ASD or VSD.   ELBOW FRACTURE SURGERY     right   ESOPHAGOGASTRODUODENOSCOPY (EGD) WITH PROPOFOL  N/A 04/04/2022   Procedure: ESOPHAGOGASTRODUODENOSCOPY (EGD) WITH PROPOFOL ;  Surgeon: Wilhelmenia Aloha Raddle., MD;  Location: [facility] ENDOSCOPY;  Service: Gastroenterology;  Laterality: N/A;   EUS N/A 04/04/2022   Procedure: UPPER ENDOSCOPIC ULTRASOUND (EUS) RADIAL;  Surgeon: Wilhelmenia Aloha Raddle., MD;  Location: [facility] ENDOSCOPY;  Service: Gastroenterology;  Laterality: N/A;   EYE SURGERY Bilateral    cataracts removed   INSERTION OF MESH N/A 11/12/2022   Procedure: INSERTION OF MESH;  Surgeon: Rubin Calamity, MD;  Location: [facility] OR;  Service: General;  Laterality: N/A;   KNEE ARTHROSCOPY WITH EXCISION PLICA Left 01/06/2015   Procedure: KNEE  ARTHROSCOPY WITH EXCISION PLICA;  Surgeon: Oneil JAYSON Herald, MD;  Location: [facility];  Service: Orthopedics;  Laterality: Left;   LIGAMENT REPAIR Right 11/28/2021   Procedure: RIGHT THUMB LIGAMENT RECONSTRUCTION AND TENDON INTERPOSITION;  Surgeon: Romona Harari, MD;  Location: [facility] OR;  Service: Orthopedics;  Laterality: Right;   Lower Extremity Arterial Dopplers  07/06/2013   RABI 1.0, LABI 1.1.; No evidence of significant vascular atherogenic plaque   NECK SURGERY     NM MYOVIEW  LTD  09/2014   a) LOW RISK. Normal EF of 65%, no RWMA. NO ISCHEMIA OR INFARCTION.;; b) 05/02/2009: Normal study.  LOW RISK.  No ischemia or infarction.  EF 74%.  Breast attenuation noted.   TONSILLECTOMY     TRANSTHORACIC ECHOCARDIOGRAM  03/28/2021   a) Normal EF 60 to 65%.  GR 1 DD.  No R WMA.  Normal  RV size and function.  Normal RAP.  Normal valves. => NORMAL;; b) 04/23/16/2010: Normal study.  LOW RISK.  No ischemia or infarction.  EF 74%.  Breast attenuation noted.17/2010: ([facility]) EF>55%.  Normal wall motion.  Normal valves.   WRIST FRACTURE SURGERY     left, has plate   XI ROBOTIC ASSISTED PARAESOPHAGEAL HERNIA REPAIR N/A 11/12/2022   Procedure: ROBOTIC DIAPHRAGMATIC HERNIA REPAIR WITH MESH;  Surgeon: Rubin Calamity, MD;  Location: [facility] OR;  Service: General;  Laterality: N/A;    reports that she has been smoking cigarettes. She started smoking about 47 years ago. She has a 23.6 pack-year smoking history. She has never used smokeless tobacco. She reports that she does not drink alcohol and does not use drugs. family history includes Dementia in her mother; Diabetes in her maternal aunt, maternal grandmother, and another family member; Emphysema in her father; Heart disease in her brother. Allergies[1] Medications Ordered Prior to Encounter[2]      ROS:  All others reviewed and negative.  Objective        PE:  BP 126/78 (BP Location: Left Arm, Patient Position: Sitting, Cuff Size: Normal)   Pulse 90   Temp 97.9 F (36.6 C) (Oral)   Ht 5' 4 (1.626 m)   Wt 123 lb (55.8 kg)   SpO2 96%   BMI 21.11 kg/m                 Constitutional: Pt appears mild ill               HENT: Head: NCAT.                Right Ear: External ear normal.                 Left Ear: External ear normal. Bilat tm's with mild erythema.  Max sinus areas non tender.  Pharynx with mild erythema, no exudate               Eyes: . Pupils are equal, round, and reactive to light. Conjunctivae and EOM are normal               Nose: without d/c or deformity               Neck: Neck supple. Gross normal ROM               Cardiovascular: Normal rate and regular rhythm.                 Pulmonary/Chest: Effort normal and breath sounds without rales but with few mild to mod bilateral wheezing.  Neurological: Pt is  alert. At baseline orientation, motor grossly intact               Skin: Skin is warm. No rashes, no other new lesions, LE edema - none               Psychiatric: Pt behavior is normal without agitation   Micro: none  Cardiac tracings I have personally interpreted today:  none  Pertinent Radiological findings (summarize): none   Lab Results  Component Value Date   WBC 7.4 04/29/2024   HGB 12.8 04/29/2024   HCT 38.2 04/29/2024   PLT 284.0 04/29/2024   GLUCOSE 72 04/29/2024   CHOL 264 (H) 12/29/2023   TRIG 312.0 (H) 12/29/2023   HDL 42.00 12/29/2023   LDLDIRECT 178.0 04/09/2023   LDLCALC 159 (H) 12/29/2023   ALT 8 04/29/2024   AST 12 04/29/2024   NA 138 04/29/2024   K 3.7 04/29/2024   CL 104 04/29/2024   CREATININE 1.08 04/29/2024   BUN 9 04/29/2024   CO2 27 04/29/2024   TSH 2.26 12/29/2023   INR 0.9 08/31/2008   HGBA1C 5.8 04/29/2024   Assessment/Plan:  Lori [name] is a 69 y.o. White or Caucasian [1] female with  has a past medical history of Allergic rhinitis, Anemia, Ankle fracture (05/2016), Anxiety, Arthritis, Asthma, Barrett esophagus (2007), Chest pain, COPD (chronic obstructive pulmonary disease) with chronic bronchitis (HCC), Depression, Duodenitis without mention of hemorrhage (2007), Esophageal reflux (2007), Esophageal stricture, Full dentures, H/O hiatal hernia, Hiatal hernia (7993,7992), HSV infection, Hyperlipidemia, Multiple fractures, Pneumonia (10/2016), Restrictive lung disease (06/30/2017), and Ulcer.  Asthma, chronic obstructive, with acute exacerbation (HCC) Mild to mod, for antibx course augmentin  875 bid course, cough med prn, medrol  dosepak, depomedrol 80 mg IM,  to f/u any worsening symptoms or concerns, also for CXR r/o pna   Hyperglycemia Lab Results  Component Value Date   HGBA1C 5.8 04/29/2024   Stable, pt to continue current medical treatment - diet, wt control   Vitamin D  deficiency Last vitamin D  Lab Results  Component Value Date    VD25OH 51.02 01/16/2024   Stable, cont oral replacement  Followup: Return in about 6 months (around 04/03/2025).  Lynwood Rush, MD 10/04/2024 1:14 PM [facility]     [1]  Allergies Allergen Reactions   Advil  [Ibuprofen ] Nausea And Vomiting   Bee Venom Swelling   Codeine  Nausea Only and Other (See Comments)    CAUSES ULCERS   Clonidine And Derivatives Other (See Comments)    Unknown    Doxycycline  Nausea Only   Other Other (See Comments)    Pet dander and mold: Takes shots to address these   Statins Other (See Comments)    unknown   Latex Rash   Levaquin  [Levofloxacin ] Nausea Only   Propoxyphene N-Acetaminophen  Nausea Only  [2]  Current Outpatient Medications on File Prior to Visit  Medication Sig Dispense Refill   AIMOVIG 140 MG/ML SOAJ Inject 140 mg into the skin every 30 (thirty) days.     ALPRAZolam  (XANAX ) 1 MG tablet TAKE 1 TABLET BY MOUTH AT BEDTIME AS NEEDED FOR ANXIETY 90 tablet 1   benzonatate  (TESSALON ) 100 MG capsule Take 1 capsule (100 mg total) by mouth 3 (three) times daily. 90 capsule 1   BREZTRI  AEROSPHERE 160-9-4.8 MCG/ACT AERO inhaler Inhale into the lungs.     busPIRone  (BUSPAR ) 10 MG tablet Take 1 tablet (10 mg total) by mouth  2 (two) times daily. 60 tablet 2   cetirizine  (ZYRTEC  ALLERGY) 10 MG tablet take1 tablet by mouth once a day as needed for runny nose/drainage down throat (Patient taking differently: Take 10 mg by mouth in the morning.) 30 tablet 3   colchicine  0.6 MG tablet Take 1 tablet (0.6 mg total) by mouth daily. 30 tablet 3   cyclobenzaprine  (FLEXERIL ) 5 MG tablet TAKE 1 TABLET BY MOUTH 3 TIMES DAILY AS NEEDED FOR MUSCLE SPASMS 40 tablet 11   dexlansoprazole  (DEXILANT ) 60 MG capsule TAKE 1 CAPSULE(S) BY MOUTH 1 TIMES PER DAY *NEW PRESCRIPTION REQUEST* 90 capsule 3   dextromethorphan -guaiFENesin  (MUCINEX  DM) 30-600 MG 12hr tablet Take 1 tablet by mouth 2 (two) times daily. 20  tablet 0   diphenoxylate -atropine  (LOMOTIL ) 2.5-0.025 MG tablet Take 1 tablet by mouth 4 (four) times daily as needed for diarrhea or loose stools. 30 tablet 0   EPINEPHRINE  0.3 mg/0.3 mL IJ SOAJ injection INJECT 0.3MG  INTRAMUSCULARLY AS NEEDED FOR ANAPHYLAXIS. DO NOT REFRIGERATE 2 each 11   FASENRA  30 MG/ML prefilled syringe INJECT 30MG  SUBCUTANEOUSLY EVERY 8 WEEKS 1 mL 6   FLUoxetine  (PROZAC ) 20 MG capsule TAKE 1 CAPSULE BY MOUTH EVERY DAY 90 capsule 3   gabapentin  (NEURONTIN ) 600 MG tablet Take 600 mg by mouth daily in the afternoon.     levalbuterol  (XOPENEX  HFA) 45 MCG/ACT inhaler Inhale 2 puffs into the lungs every 4 (four) hours as needed for wheezing. 15 g 3   meclizine  (ANTIVERT ) 12.5 MG tablet TAKE 1 TABLET BY MOUTH THREE TIMES DAILY AS NEEDED FOR DIZZINESS 60 tablet 0   montelukast  (SINGULAIR ) 10 MG tablet TAKE 1 TABLET BY MOUTH AT BEDTIME *NEW PRESCRIPTION REQUEST* 90 tablet 3   nitroGLYCERIN  (NITROSTAT ) 0.4 MG SL tablet Place 1 tablet (0.4 mg total) under the tongue every 5 (five) minutes as needed for chest pain. 25 tablet 3   ondansetron  (ZOFRAN ) 4 MG tablet Take 1 tablet (4 mg total) by mouth every 8 (eight) hours as needed for nausea or vomiting. 20 tablet 0   predniSONE  (DELTASONE ) 10 MG tablet 3 tabs by mouth per day for 3 days,2tabs per day for 3 days,1tab per day for 3 days 18 tablet 0   promethazine  (PHENERGAN ) 12.5 MG tablet Take 1 tablet (12.5 mg total) by mouth every 6 (six) hours as needed for nausea or vomiting. 270 tablet 1   risedronate  (ACTONEL ) 35 MG tablet TAKE 1 TABLET BY MOUTH ONCE WEEKLY WITH WATER  ON AN EMPTY STOMACH, NOTHING BY MOUTH OR LIE DOWN FOR NEXT 30 MINUTES 12 tablet 3   tiZANidine  (ZANAFLEX ) 2 MG tablet TAKE 1 TABLET BY MOUTH EVERY 8 HOURS AS NEEDED FOR MUSCLE SPASMS 270 tablet 3   traMADol  (ULTRAM ) 50 MG tablet Take 1 tablet (50 mg total) by mouth every 6 (six) hours as needed. 60 tablet 2   valACYclovir  (VALTREX ) 1000 MG tablet Take 1 tablet (1,000 mg  total) by mouth 2 (two) times daily. 180 tablet 3   zolpidem  (AMBIEN ) 5 MG tablet TAKE 1 TABLET BY MOUTH AT BEDTIME AS NEEDED FOR SLEEP 90 tablet 1   nitroGLYCERIN  (NITROSTAT ) 0.4 MG SL tablet Place 1 tablet (0.4 mg total) under the tongue every 5 (five) minutes as needed for chest pain. 7 tablet 0   Current Facility-Administered Medications on File Prior to Visit  Medication Dose Route Frequency Provider Last Rate Last Admin   benralizumab  (FASENRA ) prefilled syringe 30 mg  30 mg Subcutaneous Q8 Weeks Gallagher, Joel Louis, MD  30 mg at 09/28/24 0941

## 2024-10-05 ENCOUNTER — Telehealth: Payer: Self-pay

## 2024-10-05 ENCOUNTER — Ambulatory Visit: Admitting: Allergy & Immunology

## 2024-10-05 NOTE — Telephone Encounter (Signed)
 Copied from CRM #[id number]. Topic: Clinical - Prescription Issue >> Oct 04, 2024  5:24 PM Rexford HERO wrote: Reason for CRM: Raya with [facility] calling let provider know that the methylPREDNISolone  (MEDROL  DOSEPAK) 4 MG TBPK tablet the pack only last for 6 days. Suggested just putting just methylPREDNISolone  (MEDROL  DOSEPAK) 4 MG and not the pack for the prescription to be for 9 days. Or say use as directed for the pack. Please send a new prescription.  Call back #[phone]

## 2024-10-05 NOTE — Telephone Encounter (Signed)
 No that is ok, the 6 days Dosepack should be good for her.   Thanks!

## 2024-10-05 NOTE — Telephone Encounter (Signed)
 Actually she got 60 tramadol  of the Chrismon is present from her primary care doctor so lets hold off on any pain medicine prescription for now.  Thanks.  If she did not have that allergy I would send her in an anti-inflammatory but I looked on her meds and she has not really had any anti-inflammatories.  Thanks

## 2024-10-06 ENCOUNTER — Telehealth: Payer: Self-pay

## 2024-10-06 ENCOUNTER — Emergency Department (HOSPITAL_COMMUNITY)

## 2024-10-06 ENCOUNTER — Emergency Department (HOSPITAL_COMMUNITY)
Admission: EM | Admit: 2024-10-06 | Discharge: 2024-10-06 | Disposition: A | Discharge status: Home / Self Care | Attending: Emergency Medicine | Admitting: Emergency Medicine

## 2024-10-06 DIAGNOSIS — Z91040 Latex allergy status: Secondary | ICD-10-CM | POA: Diagnosis not present

## 2024-10-06 DIAGNOSIS — Z7951 Long term (current) use of inhaled steroids: Secondary | ICD-10-CM | POA: Insufficient documentation

## 2024-10-06 DIAGNOSIS — R0689 Other abnormalities of breathing: Secondary | ICD-10-CM | POA: Insufficient documentation

## 2024-10-06 DIAGNOSIS — R059 Cough, unspecified: Secondary | ICD-10-CM | POA: Diagnosis present

## 2024-10-06 DIAGNOSIS — J449 Chronic obstructive pulmonary disease, unspecified: Secondary | ICD-10-CM | POA: Insufficient documentation

## 2024-10-06 DIAGNOSIS — R918 Other nonspecific abnormal finding of lung field: Secondary | ICD-10-CM | POA: Insufficient documentation

## 2024-10-06 DIAGNOSIS — N289 Disorder of kidney and ureter, unspecified: Secondary | ICD-10-CM

## 2024-10-06 LAB — CBC WITH DIFFERENTIAL/PLATELET
Abs Immature Granulocytes: 0.05 K/uL (ref 0.00–0.07)
Basophils Absolute: 0 K/uL (ref 0.0–0.1)
Basophils Relative: 0 %
Eosinophils Absolute: 0 K/uL (ref 0.0–0.5)
Eosinophils Relative: 0 %
HCT: 38.7 % (ref 36.0–46.0)
Hemoglobin: 13.3 g/dL (ref 12.0–15.0)
Immature Granulocytes: 1 %
Lymphocytes Relative: 29 %
Lymphs Abs: 2.6 K/uL (ref 0.7–4.0)
MCH: 30.9 pg (ref 26.0–34.0)
MCHC: 34.4 g/dL (ref 30.0–36.0)
MCV: 89.8 fL (ref 80.0–100.0)
Monocytes Absolute: 0.6 K/uL (ref 0.1–1.0)
Monocytes Relative: 6 %
Neutro Abs: 5.6 K/uL (ref 1.7–7.7)
Neutrophils Relative %: 64 %
Platelets: 308 K/uL (ref 150–400)
RBC: 4.31 MIL/uL (ref 3.87–5.11)
RDW: 13.7 % (ref 11.5–15.5)
WBC: 8.9 K/uL (ref 4.0–10.5)
nRBC: 0 % (ref 0.0–0.2)

## 2024-10-06 LAB — BASIC METABOLIC PANEL WITH GFR
Anion gap: 13 (ref 5–15)
BUN: 18 mg/dL (ref 8–23)
CO2: 22 mmol/L (ref 22–32)
Calcium: 9.4 mg/dL (ref 8.9–10.3)
Chloride: 103 mmol/L (ref 98–111)
Creatinine, Ser: 1.53 mg/dL — ABNORMAL HIGH (ref 0.44–1.00)
GFR, Estimated: 37 mL/min — ABNORMAL LOW
Glucose, Bld: 99 mg/dL (ref 70–99)
Potassium: 4 mmol/L (ref 3.5–5.1)
Sodium: 139 mmol/L (ref 135–145)

## 2024-10-06 LAB — RESP PANEL BY RT-PCR (RSV, FLU A&B, COVID)  RVPGX2
Influenza A by PCR: NEGATIVE
Influenza B by PCR: NEGATIVE
Resp Syncytial Virus by PCR: NEGATIVE
SARS Coronavirus 2 by RT PCR: NEGATIVE

## 2024-10-06 MED ORDER — LACTATED RINGERS IV SOLN: INTRAVENOUS | Status: DC

## 2024-10-06 MED ORDER — OXYCODONE-ACETAMINOPHEN 5-325 MG PO TABS
1.0000 | ORAL_TABLET | Freq: Once | ORAL | Status: AC
  Administered 2024-10-06: 1 via ORAL
  Filled 2024-10-06: qty 1

## 2024-10-06 MED ORDER — LACTATED RINGERS IV BOLUS
1000.0000 mL | Freq: Once | INTRAVENOUS | Status: AC
  Administered 2024-10-06: 1000 mL via INTRAVENOUS

## 2024-10-06 NOTE — Telephone Encounter (Signed)
 Copied from CRM #[id number]. Topic: Clinical - Lab/Test Results >> Oct 06, 2024  9:19 AM Wess RAMAN wrote: Reason for CRM: Patient would like to know her chest xray results.   Callback #: [phone]

## 2024-10-06 NOTE — ED Provider Notes (Signed)
 " [place] [facility] AT [facility] Provider Note   CSN: [id number] Arrival date & time: 10/06/24  1025     Patient presents with: No chief complaint on file.   Lori [name] is a 69 y.o. female.   This is a 69 year old female with history of COPD who presents with worsening cough x 2 to 4 months.  Patient seen by her doctor yesterday for checkup and blood work done which did show some renal insufficiency with a creatinine of 1.6.  Patient herself denies any vomiting or diarrhea.  States that her cough has been nonproductive.  Has been using her home medications without relief.  Was placed on new medications by her doctor 2 days ago.  Denies any history of volume loss       Prior to Admission medications  Medication Sig Start Date End Date Taking? Authorizing Provider  AIMOVIG 140 MG/ML SOAJ Inject 140 mg into the skin every 30 (thirty) days.    [provider]  albuterol  (VENTOLIN  HFA) 108 (90 Base) MCG/ACT inhaler Inhale 2 puffs into the lungs every 6 (six) hours as needed for wheezing or shortness of breath. 10/04/24   Norleen Lynwood ORN, MD  ALPRAZolam  (XANAX ) 1 MG tablet TAKE 1 TABLET BY MOUTH AT BEDTIME AS NEEDED FOR ANXIETY 07/06/24   Norleen Lynwood ORN, MD  amoxicillin -clavulanate (AUGMENTIN ) 875-125 MG tablet Take 1 tablet by mouth 2 (two) times daily. 10/04/24   Norleen Lynwood ORN, MD  benzonatate  (TESSALON ) 100 MG capsule Take 1 capsule (100 mg total) by mouth 3 (three) times daily. 03/02/24   Norleen Lynwood ORN, MD  BREZTRI  AEROSPHERE 160-9-4.8 MCG/ACT AERO inhaler Inhale into the lungs. 09/29/24   [provider]  busPIRone  (BUSPAR ) 10 MG tablet Take 1 tablet (10 mg total) by mouth 2 (two) times daily. 02/06/24   Norleen Lynwood ORN, MD  cetirizine  (ZYRTEC  ALLERGY) 10 MG tablet take1 tablet by mouth once a day as needed for runny nose/drainage down throat Patient taking differently: Take 10 mg by mouth in the morning. 11/04/23   Cheryl Reusing, FNP  colchicine   0.6 MG tablet Take 1 tablet (0.6 mg total) by mouth daily. 05/31/24   Gerome Maurilio HERO, PA-C  cyclobenzaprine  (FLEXERIL ) 5 MG tablet TAKE 1 TABLET BY MOUTH 3 TIMES DAILY AS NEEDED FOR MUSCLE SPASMS 02/06/24   Norleen Lynwood ORN, MD  dexlansoprazole  (DEXILANT ) 60 MG capsule TAKE 1 CAPSULE(S) BY MOUTH 1 TIMES PER DAY *NEW PRESCRIPTION REQUEST* 02/06/24   Norleen Lynwood ORN, MD  dextromethorphan -guaiFENesin  (MUCINEX  DM) 30-600 MG 12hr tablet Take 1 tablet by mouth 2 (two) times daily. 01/19/24   Cheryle Page, MD  diphenoxylate -atropine  (LOMOTIL ) 2.5-0.025 MG tablet Take 1 tablet by mouth 4 (four) times daily as needed for diarrhea or loose stools. 02/03/24   Norleen Lynwood ORN, MD  EPINEPHRINE  0.3 mg/0.3 mL IJ SOAJ injection INJECT 0.3MG  INTRAMUSCULARLY AS NEEDED FOR ANAPHYLAXIS. DO NOT REFRIGERATE 02/06/24   Cheryl Reusing, FNP  FASENRA  30 MG/ML prefilled syringe INJECT 30MG  SUBCUTANEOUSLY EVERY 8 WEEKS 09/21/23   Iva Marty Saltness, MD  FLUoxetine  (PROZAC ) 20 MG capsule TAKE 1 CAPSULE BY MOUTH EVERY DAY 06/07/24   Norleen Lynwood ORN, MD  Fluticasone -Umeclidin-Vilant (TRELEGY ELLIPTA ) 100-62.5-25 MCG/ACT AEPB Inhale 1 puff into the lungs daily. 10/04/24   Norleen Lynwood ORN, MD  gabapentin  (NEURONTIN ) 600 MG tablet Take 600 mg by mouth daily in the afternoon. 04/30/22   [provider]  HYDROcodone  bit-homatropine (HYCODAN) 5-1.5 MG/5ML syrup Take 5 mLs by  mouth every 6 (six) hours as needed for up to 10 days. 10/04/24 10/14/24  Norleen Lynwood ORN, MD  levalbuterol  (XOPENEX  HFA) 45 MCG/ACT inhaler Inhale 2 puffs into the lungs every 4 (four) hours as needed for wheezing. 06/04/24   Cheryl Reusing, FNP  meclizine  (ANTIVERT ) 12.5 MG tablet TAKE 1 TABLET BY MOUTH THREE TIMES DAILY AS NEEDED FOR DIZZINESS 03/31/24   Norleen Lynwood ORN, MD  methylPREDNISolone  (MEDROL  DOSEPAK) 4 MG TBPK tablet 4 tab by mouth once daily x 3 days, then 2 tab x 3 days, then 1 tab x 3 days 10/04/24   Norleen Lynwood ORN, MD  montelukast  (SINGULAIR ) 10 MG tablet TAKE 1  TABLET BY MOUTH AT BEDTIME *NEW PRESCRIPTION REQUEST* 02/06/24   Norleen Lynwood ORN, MD  nitroGLYCERIN  (NITROSTAT ) 0.4 MG SL tablet Place 1 tablet (0.4 mg total) under the tongue every 5 (five) minutes as needed for chest pain. 06/06/23   Norleen Lynwood ORN, MD  nitroGLYCERIN  (NITROSTAT ) 0.4 MG SL tablet Place 1 tablet (0.4 mg total) under the tongue every 5 (five) minutes as needed for chest pain. 04/04/24 07/03/24  Zhao, Xika, NP  ondansetron  (ZOFRAN ) 4 MG tablet Take 1 tablet (4 mg total) by mouth every 8 (eight) hours as needed for nausea or vomiting. 01/19/24   Cheryle Page, MD  predniSONE  (DELTASONE ) 10 MG tablet 3 tabs by mouth per day for 3 days,2tabs per day for 3 days,1tab per day for 3 days 09/15/24   Norleen Lynwood ORN, MD  promethazine  (PHENERGAN ) 12.5 MG tablet Take 1 tablet (12.5 mg total) by mouth every 6 (six) hours as needed for nausea or vomiting. 09/22/24   Norleen Lynwood ORN, MD  risedronate  (ACTONEL ) 35 MG tablet TAKE 1 TABLET BY MOUTH ONCE WEEKLY WITH WATER  ON AN EMPTY STOMACH, NOTHING BY MOUTH OR LIE DOWN FOR NEXT 30 MINUTES 10/01/24   Norleen Lynwood ORN, MD  tiZANidine  (ZANAFLEX ) 2 MG tablet TAKE 1 TABLET BY MOUTH EVERY 8 HOURS AS NEEDED FOR MUSCLE SPASMS 06/07/24   Norleen Lynwood ORN, MD  traMADol  (ULTRAM ) 50 MG tablet Take 1 tablet (50 mg total) by mouth every 6 (six) hours as needed. 09/08/24   Norleen Lynwood ORN, MD  valACYclovir  (VALTREX ) 1000 MG tablet Take 1 tablet (1,000 mg total) by mouth 2 (two) times daily. 09/20/24   Norleen Lynwood ORN, MD  zolpidem  (AMBIEN ) 5 MG tablet TAKE 1 TABLET BY MOUTH AT BEDTIME AS NEEDED FOR SLEEP 07/12/24   Norleen Lynwood ORN, MD    Allergies: Advil  [ibuprofen ], Bee venom, Codeine , Clonidine and derivatives, Doxycycline , Other, Statins, Latex, Levaquin  [levofloxacin ], and Propoxyphene n-acetaminophen     Review of Systems  All other systems reviewed and are negative.   Updated Vital Signs BP 126/72 (BP Location: Left Arm)   Pulse 72   Temp (!) 97.5 F (36.4 C) (Oral)   Resp 16    SpO2 100%   Physical Exam Vitals and nursing note reviewed.  Constitutional:      General: She is not in acute distress.    Appearance: Normal appearance. She is well-developed. She is not toxic-appearing.  HENT:     Head: Normocephalic and atraumatic.  Eyes:     General: Lids are normal.     Conjunctiva/sclera: Conjunctivae normal.     Pupils: Pupils are equal, round, and reactive to light.  Neck:     Thyroid : No thyroid  mass.     Trachea: No tracheal deviation.  Cardiovascular:     Rate and Rhythm: Normal rate and regular  rhythm.     Heart sounds: Normal heart sounds. No murmur heard.    No gallop.  Pulmonary:     Effort: Pulmonary effort is normal. No respiratory distress.     Breath sounds: No stridor. Decreased breath sounds present. No wheezing, rhonchi or rales.  Abdominal:     General: There is no distension.     Palpations: Abdomen is soft.     Tenderness: There is no abdominal tenderness. There is no rebound.  Musculoskeletal:        General: No tenderness. Normal range of motion.     Cervical back: Normal range of motion and neck supple.  Skin:    General: Skin is warm and dry.     Findings: No abrasion or rash.  Neurological:     Mental Status: She is alert and oriented to person, place, and time. Mental status is at baseline.     GCS: GCS eye subscore is 4. GCS verbal subscore is 5. GCS motor subscore is 6.     Cranial Nerves: No cranial nerve deficit.     Sensory: No sensory deficit.     Motor: Motor function is intact.  Psychiatric:        Attention and Perception: Attention normal.        Speech: Speech normal.        Behavior: Behavior normal.     (all labs ordered are listed, but only abnormal results are displayed) Labs Reviewed - No data to display  EKG: EKG Interpretation Date/Time:  Wednesday October 06 2024 10:52:41 EST Ventricular Rate:  68 PR Interval:  118 QRS Duration:  94 QT Interval:  405 QTC Calculation: 431 R Axis:   57  Text  Interpretation: Sinus rhythm Borderline short PR interval Abnormal R-wave progression, early transition No significant change since last tracing Confirmed by Dasie Faden ([id number]) on 10/06/2024 11:02:55 AM  Radiology: No results found.   Procedures   Medications Ordered in the ED  lactated ringers  infusion (has no administration in time range)  lactated ringers  bolus 1,000 mL (has no administration in time range)                                    Medical Decision Making Amount and/or Complexity of Data Reviewed Labs: ordered. Radiology: ordered. ECG/medicine tests: ordered.  Risk Prescription drug management.  Patient is EKG shows sinus rhythm without acute ischemic changes.  Chest x-ray without acute findings. Patient's outpatient labs were yesterday reviewed and creatinine was 1.6.  He was 1.53.  Given 1 L of fluids here.  Chart reviewed and no obvious nephrotoxic drugs seen.  Patient encouraged to drink liquids and will follow-up with her doctor.     Final diagnoses:  None    ED Discharge Orders     None          Dasie Faden, MD 10/06/24 1221  "

## 2024-10-06 NOTE — ED Triage Notes (Signed)
 BIB EMS from home for cough for 3-4 months, HTN, and dehydration. Encouraged to come to ER by her PCP for evaluation.   BP 146/90 Spo2 100 20 RR HR 91

## 2024-10-06 NOTE — Discharge Instructions (Signed)
 Drink plenty of liquids.  Follow-up with your doctor for repeat blood work next week

## 2024-10-06 NOTE — Telephone Encounter (Signed)
 Copied from CRM #[id number]. Topic: Clinical - Prescription Issue >> Oct 06, 2024 12:36 PM Treva T wrote: Reason for CRM: Received call from [facility] with [facility], calling in regards to request for prescription clarification for medication, methylPREDNISolone  (MEDROL  DOSEPAK) 4 MG TBPK tablet.  Requesting a return call as soon as possible to discuss further.  Can be reached at [phone].  Aware of same day call back. [facility] [employer] [employer] [address], [place] - [address] [place] [address] Phone: [phone] Fax: [phone]

## 2024-10-07 NOTE — Telephone Encounter (Signed)
 Unfortunately the cxr from jan 19 is still pending results, but I see where she had a different xray yesterday which did not show pneumonia   Thanks!

## 2024-10-07 NOTE — Telephone Encounter (Unsigned)
 Copied from CRM #[id number]. Topic: Clinical - Lab/Test Results >> Oct 07, 2024  7:41 AM Laymon HERO wrote: Reason for CRM: Patient calling to check on xray results- please reach out to patient when available

## 2024-10-08 ENCOUNTER — Encounter: Payer: Self-pay | Admitting: Internal Medicine

## 2024-10-11 ENCOUNTER — Other Ambulatory Visit: Payer: Self-pay | Admitting: Allergy & Immunology

## 2024-10-11 MED ORDER — METHYLPREDNISOLONE 4 MG PO TBPK: ORAL_TABLET | ORAL | 0 refills | Status: AC

## 2024-10-11 NOTE — Telephone Encounter (Signed)
 PCP has stated Unfortunately the cxr from jan 19 is still pending results, but I see where she had a different xray yesterday which did not show pneumonia   Thanks!

## 2024-10-11 NOTE — Telephone Encounter (Signed)
 Clarifying script sent to [facility]   thanks

## 2024-10-11 NOTE — Addendum Note (Signed)
 Addended by: NORLEEN LYNWOOD ORN on: 10/11/2024 11:49 AM   Modules accepted: Orders

## 2024-10-13 ENCOUNTER — Inpatient Hospital Stay: Admitting: Internal Medicine

## 2024-10-15 ENCOUNTER — Emergency Department (HOSPITAL_COMMUNITY)

## 2024-10-15 ENCOUNTER — Emergency Department (HOSPITAL_COMMUNITY)
Admission: EM | Admit: 2024-10-15 | Discharge: 2024-10-15 | Disposition: A | Discharge status: Home / Self Care | Attending: Emergency Medicine | Admitting: Emergency Medicine

## 2024-10-15 DIAGNOSIS — W000XXA Fall on same level due to ice and snow, initial encounter: Secondary | ICD-10-CM | POA: Diagnosis not present

## 2024-10-15 DIAGNOSIS — J449 Chronic obstructive pulmonary disease, unspecified: Secondary | ICD-10-CM | POA: Diagnosis not present

## 2024-10-15 DIAGNOSIS — S43015A Anterior dislocation of left humerus, initial encounter: Secondary | ICD-10-CM | POA: Insufficient documentation

## 2024-10-15 DIAGNOSIS — Z7951 Long term (current) use of inhaled steroids: Secondary | ICD-10-CM | POA: Insufficient documentation

## 2024-10-15 DIAGNOSIS — W19XXXA Unspecified fall, initial encounter: Secondary | ICD-10-CM

## 2024-10-15 DIAGNOSIS — S43005A Unspecified dislocation of left shoulder joint, initial encounter: Secondary | ICD-10-CM

## 2024-10-15 DIAGNOSIS — S4992XA Unspecified injury of left shoulder and upper arm, initial encounter: Secondary | ICD-10-CM | POA: Diagnosis present

## 2024-10-15 DIAGNOSIS — Z91040 Latex allergy status: Secondary | ICD-10-CM | POA: Insufficient documentation

## 2024-10-15 MED ORDER — HYDROMORPHONE HCL 1 MG/ML IJ SOLN
1.0000 mg | Freq: Once | INTRAMUSCULAR | Status: AC
  Administered 2024-10-15: 1 mg via INTRAVENOUS
  Filled 2024-10-15: qty 1

## 2024-10-15 MED ORDER — ONDANSETRON HCL 4 MG/2ML IJ SOLN
4.0000 mg | Freq: Once | INTRAMUSCULAR | Status: AC
  Administered 2024-10-15: 4 mg via INTRAVENOUS
  Filled 2024-10-15: qty 2

## 2024-10-15 MED ORDER — MORPHINE SULFATE (PF) 4 MG/ML IV SOLN
4.0000 mg | Freq: Once | INTRAVENOUS | Status: AC
  Administered 2024-10-15: 4 mg via INTRAVENOUS
  Filled 2024-10-15: qty 1

## 2024-10-15 MED ORDER — OXYCODONE-ACETAMINOPHEN 5-325 MG PO TABS: 1.0000 | ORAL_TABLET | Freq: Four times a day (QID) | ORAL | 0 refills | Status: AC | PRN

## 2024-10-15 MED ORDER — ETOMIDATE 2 MG/ML IV SOLN
10.0000 mg | Freq: Once | INTRAVENOUS | Status: AC
  Administered 2024-10-15: 10 mg via INTRAVENOUS
  Filled 2024-10-15: qty 10

## 2024-10-15 NOTE — Telephone Encounter (Signed)
 Phx called to get renewed/updated Rx for PT on Fasenra  - I advised would get noted back for clinical to review/update, he thanked

## 2024-10-15 NOTE — Progress Notes (Signed)
 Orthopedic Tech Progress Note Patient Details:  Lori [name] [birth year] [id number]  Ortho Devices Type of Ortho Device: Shoulder immobilizer Ortho Device/Splint Location: LUE Ortho Device/Splint Interventions: Application   Post Interventions Patient Tolerated: Well  Massie BRAVO Jobina Maita 10/15/2024, 3:37 PM

## 2024-10-15 NOTE — ED Triage Notes (Signed)
 Pt BIB EMS from home. Pt fell on ice landing on left shoulder while checking mail.

## 2024-10-15 NOTE — ED Provider Notes (Signed)
 " Lori [name]   CSN: [id number] Arrival date & time: 10/15/24  1144     Patient presents with: [place]   Lori [name] is a 69 y.o. female.   Pt is a 69 yo female with pmhx significant for migraines, COPD, Barrett esophagus, GERD, esophageal stricture, anxiety, and HLD.  Pt slipped on some ice today and hurt her left shoulder.  She denies any other injury.       Prior to Admission medications  Medication Sig Start Date End Date Taking? Authorizing Provider  oxyCODONE -acetaminophen  (PERCOCET/ROXICET) 5-325 MG tablet Take 1 tablet by mouth every 6 (six) hours as needed for severe pain (pain score 7-10). 10/15/24  Yes Dean Clarity, MD  AIMOVIG 140 MG/ML SOAJ Inject 140 mg into the skin every 30 (thirty) days.    [provider]  albuterol  (VENTOLIN  HFA) 108 (90 Base) MCG/ACT inhaler Inhale 2 puffs into the lungs every 6 (six) hours as needed for wheezing or shortness of breath. 10/04/24   Norleen Lynwood ORN, MD  ALPRAZolam  (XANAX ) 1 MG tablet TAKE 1 TABLET BY MOUTH AT BEDTIME AS NEEDED FOR ANXIETY 07/06/24   Norleen Lynwood ORN, MD  amoxicillin -clavulanate (AUGMENTIN ) 875-125 MG tablet Take 1 tablet by mouth 2 (two) times daily. 10/04/24   Norleen Lynwood ORN, MD  benzonatate  (TESSALON ) 100 MG capsule Take 1 capsule (100 mg total) by mouth 3 (three) times daily. 03/02/24   Norleen Lynwood ORN, MD  BREZTRI  AEROSPHERE 160-9-4.8 MCG/ACT AERO inhaler Inhale into the lungs. 09/29/24   [provider]  busPIRone  (BUSPAR ) 10 MG tablet Take 1 tablet (10 mg total) by mouth 2 (two) times daily. 02/06/24   Norleen Lynwood ORN, MD  cetirizine  (ZYRTEC  ALLERGY) 10 MG tablet take1 tablet by mouth once a day as needed for runny nose/drainage down throat Patient taking differently: Take 10 mg by mouth in the morning. 11/04/23   Cheryl Reusing, FNP  colchicine  0.6 MG tablet Take 1 tablet (0.6 mg total) by mouth daily. 05/31/24   Gerome Maurilio HERO, PA-C   cyclobenzaprine  (FLEXERIL ) 5 MG tablet TAKE 1 TABLET BY MOUTH 3 TIMES DAILY AS NEEDED FOR MUSCLE SPASMS 02/06/24   Norleen Lynwood ORN, MD  dexlansoprazole  (DEXILANT ) 60 MG capsule TAKE 1 CAPSULE(S) BY MOUTH 1 TIMES PER DAY *NEW PRESCRIPTION REQUEST* 02/06/24   Norleen Lynwood ORN, MD  dextromethorphan -guaiFENesin  (MUCINEX  DM) 30-600 MG 12hr tablet Take 1 tablet by mouth 2 (two) times daily. 01/19/24   Cheryle Page, MD  diphenoxylate -atropine  (LOMOTIL ) 2.5-0.025 MG tablet Take 1 tablet by mouth 4 (four) times daily as needed for diarrhea or loose stools. 02/03/24   Norleen Lynwood ORN, MD  EPINEPHRINE  0.3 mg/0.3 mL IJ SOAJ injection INJECT 0.3MG  INTRAMUSCULARLY AS NEEDED FOR ANAPHYLAXIS. DO NOT REFRIGERATE 02/06/24   Cheryl Reusing, FNP  FASENRA  30 MG/ML prefilled syringe INJECT 30MG  SUBCUTANEOUSLY EVERY 8 WEEKS 09/21/23   Iva Marty Saltness, MD  FLUoxetine  (PROZAC ) 20 MG capsule TAKE 1 CAPSULE BY MOUTH EVERY DAY 06/07/24   Norleen Lynwood ORN, MD  Fluticasone -Umeclidin-Vilant (TRELEGY ELLIPTA ) 100-62.5-25 MCG/ACT AEPB Inhale 1 puff into the lungs daily. 10/04/24   Norleen Lynwood ORN, MD  gabapentin  (NEURONTIN ) 600 MG tablet Take 600 mg by mouth daily in the afternoon. 04/30/22   [provider]  levalbuterol  (XOPENEX  HFA) 45 MCG/ACT inhaler Inhale 2 puffs into the lungs every 4 (four) hours as needed for wheezing. 06/04/24   Cheryl Reusing, FNP  meclizine  (ANTIVERT ) 12.5 MG tablet TAKE 1  TABLET BY MOUTH THREE TIMES DAILY AS NEEDED FOR DIZZINESS 03/31/24   Norleen Lynwood ORN, MD  methylPREDNISolone  (MEDROL  DOSEPAK) 4 MG TBPK tablet 4 tab by mouth once daily x 3 days, then 2 tab x 3 days, then 1 tab x 3 days 10/11/24   Norleen Lynwood ORN, MD  montelukast  (SINGULAIR ) 10 MG tablet TAKE 1 TABLET BY MOUTH AT BEDTIME *NEW PRESCRIPTION REQUEST* 02/06/24   Norleen Lynwood ORN, MD  nitroGLYCERIN  (NITROSTAT ) 0.4 MG SL tablet Place 1 tablet (0.4 mg total) under the tongue every 5 (five) minutes as needed for chest pain. 06/06/23   Norleen Lynwood ORN, MD   nitroGLYCERIN  (NITROSTAT ) 0.4 MG SL tablet Place 1 tablet (0.4 mg total) under the tongue every 5 (five) minutes as needed for chest pain. 04/04/24 07/03/24  Zhao, Xika, NP  ondansetron  (ZOFRAN ) 4 MG tablet Take 1 tablet (4 mg total) by mouth every 8 (eight) hours as needed for nausea or vomiting. 01/19/24   Cheryle Page, MD  predniSONE  (DELTASONE ) 10 MG tablet 3 tabs by mouth per day for 3 days,2tabs per day for 3 days,1tab per day for 3 days 09/15/24   Norleen Lynwood ORN, MD  promethazine  (PHENERGAN ) 12.5 MG tablet Take 1 tablet (12.5 mg total) by mouth every 6 (six) hours as needed for nausea or vomiting. 09/22/24   Norleen Lynwood ORN, MD  risedronate  (ACTONEL ) 35 MG tablet TAKE 1 TABLET BY MOUTH ONCE WEEKLY WITH WATER  ON AN EMPTY STOMACH, NOTHING BY MOUTH OR LIE DOWN FOR NEXT 30 MINUTES 10/01/24   Norleen Lynwood ORN, MD  tiZANidine  (ZANAFLEX ) 2 MG tablet TAKE 1 TABLET BY MOUTH EVERY 8 HOURS AS NEEDED FOR MUSCLE SPASMS 06/07/24   Norleen Lynwood ORN, MD  traMADol  (ULTRAM ) 50 MG tablet Take 1 tablet (50 mg total) by mouth every 6 (six) hours as needed. 09/08/24   Norleen Lynwood ORN, MD  valACYclovir  (VALTREX ) 1000 MG tablet Take 1 tablet (1,000 mg total) by mouth 2 (two) times daily. 09/20/24   Norleen Lynwood ORN, MD  zolpidem  (AMBIEN ) 5 MG tablet TAKE 1 TABLET BY MOUTH AT BEDTIME AS NEEDED FOR SLEEP 07/12/24   Norleen Lynwood ORN, MD    Allergies: Advil  [ibuprofen ], Bee venom, Codeine , Clonidine and derivatives, Doxycycline , Other, Statins, Latex, Levaquin  Dionysia.dicker ], and Propoxyphene n-acetaminophen     Review of Systems  Musculoskeletal:        Left shoulder pain  All other systems reviewed and are negative.   Updated Vital Signs BP (!) 159/85   Pulse 62   Temp 97.6 F (36.4 C) (Oral)   Resp (!) 5   SpO2 100%   Physical Exam Vitals and nursing [name] reviewed.  Constitutional:      Appearance: Normal appearance.  HENT:     Head: Normocephalic and atraumatic.     Right Ear: External ear normal.     Left Ear: External  ear normal.     Nose: Nose normal.     Mouth/Throat:     Mouth: Mucous membranes are moist.     Pharynx: Oropharynx is clear.  Eyes:     Extraocular Movements: Extraocular movements intact.     Conjunctiva/sclera: Conjunctivae normal.     Pupils: Pupils are equal, round, and reactive to light.  Cardiovascular:     Rate and Rhythm: Normal rate and regular rhythm.     Pulses: Normal pulses.     Heart sounds: Normal heart sounds.  Pulmonary:     Effort: Pulmonary effort is normal.  Breath sounds: Normal breath sounds.  Abdominal:     General: Abdomen is flat. Bowel sounds are normal.     Palpations: Abdomen is soft.  Musculoskeletal:     Left shoulder: Tenderness present. Decreased range of motion.     Cervical back: Normal range of motion and neck supple.  Skin:    General: Skin is warm.     Capillary Refill: Capillary refill takes less than 2 seconds.  Neurological:     General: No focal deficit present.     Mental Status: She is alert and oriented to person, place, and time.  Psychiatric:        Mood and Affect: Mood normal.        Behavior: Behavior normal.     (all labs ordered are listed, but only abnormal results are displayed) Labs Reviewed - No data to display  EKG: None  Radiology: DG Shoulder Left Portable Result Date: 10/15/2024 CLINICAL DATA:  Status post LEFT shoulder reduction. EXAM: LEFT SHOULDER COMPARISON:  10/15/2024 FINDINGS: Glenohumeral joint appears appropriately aligned on provided single frontal view. Mild degenerative changes of the [facility] joint again seen. IMPRESSION: LEFT glenohumeral joint appears appropriately aligned. Recommend Grashey and axillary view when patient condition allows. Electronically Signed   By: Aliene Lloyd M.D.   On: 10/15/2024 15:38   DG Shoulder Left Result Date: 10/15/2024 EXAM: 1 VIEW(S) XRAY OF THE LEFT SHOULDER 10/15/2024 12:48:33 PM COMPARISON: None available. CLINICAL HISTORY: Pain. FINDINGS: BONES AND JOINTS: Anterior  inferior dislocation of the humeral head in relation to the glenoid. Hill-Sachs deformity of the lateral head. Old healed left rib fractures. The [facility] joint is unremarkable. SOFT TISSUES: No abnormal calcifications. Visualized lung is unremarkable. IMPRESSION: 1. Anterior inferior dislocation of the humeral head in relation to the glenoid. 2. Hill-Sachs deformity of the lateral head. Electronically signed by: Norleen Boxer MD 10/15/2024 02:02 PM EST RP Workstation: [id number]     .Reduction of dislocation  Date/Time: 10/15/2024 3:58 PM  Performed by: Dean Clarity, MD Authorized by: Dean Clarity, MD  Consent: Written consent obtained Risks and benefits: risks, benefits and alternatives were discussed Patient identity confirmed: verbally with patient Time out: Immediately prior to procedure a time out was called to verify the correct patient, procedure, equipment, support staff and site/side marked as required.  Sedation: Patient sedated: yes Sedatives: etomidate   Patient tolerance: patient tolerated the procedure well with no immediate complications   .Sedation  Date/Time: 10/15/2024 3:59 PM  Performed by: Dean Clarity, MD Authorized by: Dean Clarity, MD   Consent:    Consent obtained:  Written   Consent given by:  Patient Universal protocol:    Immediately prior to procedure, a time out was called: yes     Patient identity confirmed:  Verbally with patient Indications:    Procedure performed:  Dislocation reduction   Procedure necessitating sedation performed by:  Physician performing sedation Pre-sedation assessment:    Time since last food or drink:  5   ASA classification: class 2 - patient with mild systemic disease     Mallampati score:  I - soft palate, uvula, fauces, pillars visible   Pre-sedation assessments completed and reviewed: airway patency, cardiovascular function, hydration status, mental status, nausea/vomiting, pain level, respiratory function and  temperature   A pre-sedation assessment was completed prior to the start of the procedure Immediate pre-procedure details:    Reassessment: Patient reassessed immediately prior to procedure   Procedure details (see MAR for exact dosages):    Sedation:  Etomidate   Intended level of sedation: deep   Analgesia:  Morphine    Intra-procedure monitoring:  Blood pressure monitoring, cardiac monitor, continuous capnometry, continuous pulse oximetry, frequent LOC assessments and frequent vital sign checks   Intra-procedure events: none     Total Provider sedation time (minutes):  30 Post-procedure details:   A post-sedation assessment was completed following the completion of the procedure.   Attendance: Constant attendance by certified staff until patient recovered     Recovery: Patient returned to pre-procedure baseline     Post-sedation assessments completed and reviewed: airway patency, cardiovascular function, hydration status, mental status, nausea/vomiting, pain level, respiratory function and temperature     Patient is stable for discharge or admission: yes     Procedure completion:  Tolerated well, no immediate complications    Medications Ordered in the ED  morphine  (PF) 4 MG/ML injection 4 mg (4 mg Intravenous Given 10/15/24 1323)  ondansetron  (ZOFRAN ) injection 4 mg (4 mg Intravenous Given 10/15/24 1322)  etomidate  (AMIDATE ) injection 10 mg (10 mg Intravenous Given 10/15/24 1427)  HYDROmorphone  (DILAUDID ) injection 1 mg (1 mg Intravenous Given 10/15/24 1448)                                    Medical Decision Making Amount and/or Complexity of Data Reviewed Radiology: ordered.  Risk Prescription drug management.   This patient presents to the ED for concern of left shoulder pain, this involves an extensive number of treatment options, and is a complaint that carries with it a high risk of complications and morbidity.  The differential diagnosis includes fx, d/l, contusion   Co  morbidities that complicate the patient evaluation  migraines, COPD, Barrett esophagus, GERD, esophageal stricture, anxiety, and HLD   Additional history obtained:  Additional history obtained from epic chart review External records from outside source obtained and reviewed including EMS report  Imaging Studies ordered:  I ordered imaging studies including l shoulder pre and post reduction I independently visualized and interpreted imaging which showed  L shoulder: Anterior inferior dislocation of the humeral head in relation to the glenoid.  2. Hill-Sachs deformity of the lateral head.  Post reduction: LEFT glenohumeral joint appears appropriately aligned. Recommend  Grashey and axillary view when patient condition allows.   I agree with the radiologist interpretation   Cardiac Monitoring:  The patient was maintained on a cardiac monitor.  I personally viewed and interpreted the cardiac monitored which showed an underlying rhythm of: nsr   Medicines ordered and prescription drug management:  I ordered medication including morphine /zofrandilaudid  for sx  Reevaluation of the patient after these medicines showed that the patient improved I have reviewed the patients home medicines and have made adjustments as needed   Critical Interventions:  reduction  Problem List / ED Course:  Left shoulder dislocation:  shoulder reduced.  Pt's O2 sat dropped briefly after dilaudid , but is back to normal now.  Pt is stable for d/c.  Return if worse.  F/u with ortho.   Reevaluation:  After the interventions noted above, I reevaluated the patient and found that they have :improved   Social Determinants of Health:  Lives at home    Dispostion:  After consideration of the diagnostic results and the patients response to treatment, I feel that the patent would benefit from discharge with outpatient f/u.       Final diagnoses:  Fall, initial encounter  Dislocation of left  shoulder joint, initial encounter    ED Discharge Orders          Ordered    oxyCODONE -acetaminophen  (PERCOCET/ROXICET) 5-325 MG tablet  Every 6 hours PRN        10/15/24 1602               Mako Pelfrey, MD 10/15/24 1603  "

## 2024-10-15 NOTE — Progress Notes (Signed)
 No change

## 2024-10-15 NOTE — ED Notes (Signed)
 Pt desat after dilaudid  SpO2 85% RA, pt placed on 2L [place] O2; Provider notified

## 2024-10-19 ENCOUNTER — Other Ambulatory Visit (HOSPITAL_COMMUNITY): Payer: Self-pay

## 2024-11-01 ENCOUNTER — Ambulatory Visit: Admitting: Internal Medicine

## 2024-11-10 ENCOUNTER — Encounter: Admitting: Orthopedic Surgery

## 2024-11-11 ENCOUNTER — Ambulatory Visit

## 2024-11-12 ENCOUNTER — Ambulatory Visit: Payer: 59

## 2024-11-18 ENCOUNTER — Ambulatory Visit: Admitting: Allergy & Immunology

## 2024-11-23 ENCOUNTER — Ambulatory Visit

## 2025-04-12 ENCOUNTER — Ambulatory Visit
